# Patient Record
Sex: Female | Born: 1970 | Race: Black or African American | Hispanic: No | Smoking: Never smoker
Health system: Southern US, Community
[De-identification: ages and names within clinical notes are randomized; demographics above are authoritative.]

## PROBLEM LIST (undated history)

## (undated) DIAGNOSIS — G43909 Migraine, unspecified, not intractable, without status migrainosus: Secondary | ICD-10-CM

## (undated) DIAGNOSIS — N186 End stage renal disease: Secondary | ICD-10-CM

## (undated) DIAGNOSIS — R1084 Generalized abdominal pain: Secondary | ICD-10-CM

## (undated) DIAGNOSIS — K3184 Gastroparesis: Secondary | ICD-10-CM

## (undated) DIAGNOSIS — M1990 Unspecified osteoarthritis, unspecified site: Secondary | ICD-10-CM

## (undated) DIAGNOSIS — N189 Chronic kidney disease, unspecified: Secondary | ICD-10-CM

## (undated) DIAGNOSIS — F32A Depression, unspecified: Secondary | ICD-10-CM

## (undated) DIAGNOSIS — F39 Unspecified mood [affective] disorder: Secondary | ICD-10-CM

## (undated) DIAGNOSIS — E1065 Type 1 diabetes mellitus with hyperglycemia: Secondary | ICD-10-CM

## (undated) DIAGNOSIS — Z8619 Personal history of other infectious and parasitic diseases: Secondary | ICD-10-CM

## (undated) DIAGNOSIS — E1040 Type 1 diabetes mellitus with diabetic neuropathy, unspecified: Secondary | ICD-10-CM

## (undated) DIAGNOSIS — J45909 Unspecified asthma, uncomplicated: Secondary | ICD-10-CM

## (undated) DIAGNOSIS — F419 Anxiety disorder, unspecified: Secondary | ICD-10-CM

## (undated) DIAGNOSIS — L281 Prurigo nodularis: Secondary | ICD-10-CM

## (undated) DIAGNOSIS — D649 Anemia, unspecified: Secondary | ICD-10-CM

## (undated) DIAGNOSIS — I509 Heart failure, unspecified: Secondary | ICD-10-CM

## (undated) DIAGNOSIS — I219 Acute myocardial infarction, unspecified: Secondary | ICD-10-CM

## (undated) DIAGNOSIS — Z992 Dependence on renal dialysis: Secondary | ICD-10-CM

## (undated) DIAGNOSIS — IMO0002 Reserved for concepts with insufficient information to code with codable children: Secondary | ICD-10-CM

## (undated) DIAGNOSIS — Z95810 Presence of automatic (implantable) cardiac defibrillator: Secondary | ICD-10-CM

## (undated) DIAGNOSIS — K219 Gastro-esophageal reflux disease without esophagitis: Secondary | ICD-10-CM

## (undated) HISTORY — DX: Personal history of other infectious and parasitic diseases: Z86.19

## (undated) HISTORY — DX: Type 1 diabetes mellitus with hyperglycemia: E10.65

## (undated) HISTORY — DX: Migraine, unspecified, not intractable, without status migrainosus: G43.909

## (undated) HISTORY — DX: Unspecified asthma, uncomplicated: J45.909

## (undated) HISTORY — DX: Reserved for concepts with insufficient information to code with codable children: IMO0002

## (undated) HISTORY — DX: Depression, unspecified: F32.A

## (undated) HISTORY — DX: Prurigo nodularis: L28.1

## (undated) HISTORY — DX: Anemia, unspecified: D64.9

## (undated) HISTORY — DX: Unspecified osteoarthritis, unspecified site: M19.90

## (undated) HISTORY — DX: Unspecified mood (affective) disorder: F39

## (undated) HISTORY — DX: Type 1 diabetes mellitus with diabetic neuropathy, unspecified: E10.40

## (undated) HISTORY — DX: Heart failure, unspecified: I50.9

## (undated) HISTORY — DX: Anxiety disorder, unspecified: F41.9

---

## 1998-05-02 ENCOUNTER — Inpatient Hospital Stay (HOSPITAL_COMMUNITY): Admission: AD | Admit: 1998-05-02 | Discharge: 1998-05-02 | Payer: Self-pay | Admitting: *Deleted

## 1998-05-03 ENCOUNTER — Inpatient Hospital Stay (HOSPITAL_COMMUNITY): Admission: AD | Admit: 1998-05-03 | Discharge: 1998-05-03 | Payer: Self-pay | Admitting: Obstetrics

## 2005-11-14 ENCOUNTER — Ambulatory Visit: Payer: Self-pay | Admitting: Family Medicine

## 2005-11-28 ENCOUNTER — Ambulatory Visit: Payer: Self-pay | Admitting: Family Medicine

## 2006-04-10 ENCOUNTER — Ambulatory Visit: Payer: Self-pay | Admitting: Family Medicine

## 2006-05-15 ENCOUNTER — Ambulatory Visit: Payer: Self-pay | Admitting: Family Medicine

## 2006-07-03 ENCOUNTER — Ambulatory Visit: Payer: Self-pay | Admitting: Family Medicine

## 2006-09-18 ENCOUNTER — Ambulatory Visit: Payer: Self-pay | Admitting: Family Medicine

## 2007-06-18 DIAGNOSIS — E1040 Type 1 diabetes mellitus with diabetic neuropathy, unspecified: Secondary | ICD-10-CM

## 2007-06-18 DIAGNOSIS — E1069 Type 1 diabetes mellitus with other specified complication: Secondary | ICD-10-CM | POA: Insufficient documentation

## 2008-01-01 ENCOUNTER — Telehealth (INDEPENDENT_AMBULATORY_CARE_PROVIDER_SITE_OTHER): Payer: Self-pay | Admitting: Internal Medicine

## 2008-08-11 ENCOUNTER — Emergency Department (HOSPITAL_COMMUNITY): Admission: EM | Admit: 2008-08-11 | Discharge: 2008-08-11 | Payer: Self-pay | Admitting: Emergency Medicine

## 2008-09-21 ENCOUNTER — Ambulatory Visit: Payer: Self-pay | Admitting: "Endocrinology

## 2008-10-12 ENCOUNTER — Ambulatory Visit: Payer: Self-pay | Admitting: "Endocrinology

## 2009-07-05 ENCOUNTER — Encounter: Admission: RE | Admit: 2009-07-05 | Discharge: 2009-07-05 | Payer: Self-pay | Admitting: Internal Medicine

## 2010-08-26 ENCOUNTER — Encounter: Payer: Self-pay | Admitting: Internal Medicine

## 2010-11-19 LAB — DIFFERENTIAL
Basophils Absolute: 0.1 10*3/uL (ref 0.0–0.1)
Lymphocytes Relative: 34 % (ref 12–46)
Monocytes Absolute: 0.3 10*3/uL (ref 0.1–1.0)
Monocytes Relative: 6 % (ref 3–12)
Neutro Abs: 2.8 10*3/uL (ref 1.7–7.7)

## 2010-11-19 LAB — WET PREP, GENITAL

## 2010-11-19 LAB — URINALYSIS, ROUTINE W REFLEX MICROSCOPIC
Hgb urine dipstick: NEGATIVE
Leukocytes, UA: NEGATIVE
Specific Gravity, Urine: 1.025 (ref 1.005–1.030)
Urobilinogen, UA: 1 mg/dL (ref 0.0–1.0)

## 2010-11-19 LAB — URINE MICROSCOPIC-ADD ON

## 2010-11-19 LAB — CBC
Hemoglobin: 12.7 g/dL (ref 12.0–15.0)
RBC: 4.23 MIL/uL (ref 3.87–5.11)
RDW: 13.2 % (ref 11.5–15.5)

## 2010-11-19 LAB — GLUCOSE, CAPILLARY

## 2010-11-19 LAB — POCT PREGNANCY, URINE: Preg Test, Ur: POSITIVE

## 2012-12-02 ENCOUNTER — Ambulatory Visit (INDEPENDENT_AMBULATORY_CARE_PROVIDER_SITE_OTHER): Payer: BC Managed Care – PPO | Admitting: Internal Medicine

## 2012-12-02 ENCOUNTER — Other Ambulatory Visit: Payer: Self-pay | Admitting: Internal Medicine

## 2012-12-02 ENCOUNTER — Encounter: Payer: Self-pay | Admitting: Internal Medicine

## 2012-12-02 VITALS — BP 118/74 | HR 99 | Temp 97.7°F | Resp 12 | Ht 71.25 in | Wt 158.0 lb

## 2012-12-02 DIAGNOSIS — E559 Vitamin D deficiency, unspecified: Secondary | ICD-10-CM

## 2012-12-02 DIAGNOSIS — Z862 Personal history of diseases of the blood and blood-forming organs and certain disorders involving the immune mechanism: Secondary | ICD-10-CM

## 2012-12-02 DIAGNOSIS — E119 Type 2 diabetes mellitus without complications: Secondary | ICD-10-CM

## 2012-12-02 DIAGNOSIS — Z8639 Personal history of other endocrine, nutritional and metabolic disease: Secondary | ICD-10-CM

## 2012-12-02 LAB — COMPREHENSIVE METABOLIC PANEL
ALT: 13 U/L (ref 0–35)
Albumin: 4 g/dL (ref 3.5–5.2)
CO2: 27 mEq/L (ref 19–32)
Calcium: 9.4 mg/dL (ref 8.4–10.5)
Chloride: 103 mEq/L (ref 96–112)
GFR: 140.75 mL/min (ref >60.00)
Glucose, Bld: 317 mg/dL — ABNORMAL HIGH (ref 70–99)
Potassium: 4.5 mEq/L (ref 3.5–5.1)
Sodium: 137 mEq/L (ref 135–145)
Total Bilirubin: 1.4 mg/dL — ABNORMAL HIGH (ref 0.3–1.2)
Total Protein: 7.6 g/dL (ref 6.0–8.3)

## 2012-12-02 LAB — HEMOGLOBIN A1C: Hgb A1c MFr Bld: 11.4 % — ABNORMAL HIGH (ref 4.6–6.5)

## 2012-12-02 LAB — CBC
HCT: 41.1 % (ref 36.0–46.0)
Hemoglobin: 14.1 g/dL (ref 12.0–15.0)
Platelets: 216 10*3/uL (ref 150.0–400.0)
RBC: 4.64 Mil/uL (ref 3.87–5.11)
WBC: 5.9 10*3/uL (ref 4.5–10.5)

## 2012-12-02 LAB — MICROALBUMIN / CREATININE URINE RATIO: Microalb Creat Ratio: 3.5 mg/g (ref 0.0–30.0)

## 2012-12-02 LAB — LIPID PANEL: Total CHOL/HDL Ratio: 3

## 2012-12-02 MED ORDER — GLUCOSE BLOOD VI STRP: ORAL_STRIP | Status: DC

## 2012-12-02 MED ORDER — INSULIN GLARGINE 100 UNITS/ML SOLOSTAR PEN: 45.0000 [IU] | PEN_INJECTOR | Freq: Every morning | SUBCUTANEOUS | Status: DC

## 2012-12-02 MED ORDER — INSULIN ASPART 100 UNIT/ML FLEXPEN: SUBCUTANEOUS | Status: DC

## 2012-12-02 MED ORDER — ONETOUCH ULTRA SYSTEM W/DEVICE KIT: 1.0000 | PACK | Freq: Once | Status: DC

## 2012-12-02 MED ORDER — GLUCAGON (RDNA) 1 MG IJ KIT: 1.0000 mg | PACK | Freq: Once | INTRAMUSCULAR | Status: DC | PRN

## 2012-12-02 MED ORDER — ONETOUCH LANCETS MISC: Status: DC

## 2012-12-02 MED ORDER — "PEN NEEDLES 3/16"" 31G X 5 MM MISC": 1.0000 | Freq: Four times a day (QID) | Status: DC

## 2012-12-02 NOTE — Patient Instructions (Addendum)
Please return in 2 weeks with your sugar log. Decrease Lantus to 45 units.  Please do not inject short acting insulin at bedtime.   Basic Rules for Patients with Type I Diabetes Mellitus  1. The American Diabetes Association (ADA) recommended targets: - fasting sugar <130 - after meal sugar <180 - HbA1C <7%  2. Engage in ?150 min moderate exercise per week  3. Make sure you have ?8h of sleep every night as this helps both blood sugars and your weight.  4. Always keep a sugar log (not only record in your meter) and bring it to all appointments with [place].  5. "15-15 rule" for hypoglycemia: if sugars are low, take 15 g of carbs** ("fast sugar" - e.g. 4 glucose tablets, 4 oz orange juice), wait 15 min, then check sugars again. If still <80, repeat. Continue  until your sugars >80, then eat a normal meal.   6. Teach family members and coworkers to inject glucagon. Have a glucagon set at home and one at work. They should call 911 after using the set.  7. If you are on a pump, make sure the basal daily insulin dose is approximately equal (not larger) to the daily insulin you get from boluses, otherwise you are at risk for hypoglycemia.  8. Check sugar before driving. If <100, correct, and only start driving if sugars rise ?100. Check sugar every hour when on a long drive.  9. Check sugar before exercising. If <100, correct, and only start exercising if sugars rise ?100. Check sugar every hour when on a long exercise routine and 1h after you finished exercising.   If >250, check urine for ketones. If you have moderate-large ketones in urine, do not start exercise. Hydrate yourself with clear liquids and correct the high sugar. Recheck sugars and ketones before attempting to exercise.  Be aware that you might need less insulin when exercising.  *intense, short, exercise bursts can increase your sugars, but  *less intense, longer (>1h), exercise routines can decrease your sugars.  If you are on  a pump, you might need to decrease your basal rate by 10% or more (or even disconnect your pump) while you exercise to prevent low sugars. Do not disconnect your pump by more than 3 hours at a time! You also might need to decrease your insulin bolus for the meal prior to your exercise time by 20% or more.  10. Make sure you have a MedAlert bracelet or pendant mentioning "Type I Diabetes Mellitus". If you have a prior episode of severe hypoglycemia or hypoglycemia unawareness, it should also mention this.  11. Please do not walk barefoot. Inspect your feet for sores/cuts and let us know if you have them.  12. Please call [facility] with any questions and concerns [phone]).   **E.g. of "fast carbs":   first choice (15 g):  1 tube glucose gel, GlucoPouch 15, 2 oz glucose liquid   second choice (15-16 g):  3 or 4 glucose tablets (best taken  with water), 15 Dextrose Bits chewable   third choice (15-20 g):   cup fruit juice,  cup regular soda, 1 cup skim milk,  1 cup sports drink   fourth choice (15-20 g):  1 small tube [place] gel (not frosting), 2 tbsp raisins, 1 tbsp table sugar,  candy, jelly beans, gum drops - check package for carb amount   (adapted from: Lenice Pressman. "Insulin therapy and hypoglycemia" Endocrinol Metab Clin N Am 2012, 41: 57-87)

## 2012-12-02 NOTE — Progress Notes (Signed)
Patient ID: [name], female   DOB: 08/07/1970, 42 y.o.   MRN: [id number] HPI: [name] is a 42 y.o.-year-old female, self-referred, for management of DM (questionable if type 1 or 2), insulin-dependent, uncontrolled, with complications.  Patient has been diagnosed with diabetes in 1996, with her first pregnancy; she had 2 miscarriages; she did not have repeat testing until her next pregnancy in 2006; she started to have blurry vision, tingling and numbness in feet. She was told she needs insulin then and started it. She did not have [employer] for 3 years. She used to go see [facility] up to 2011, after this, [facility] She now has insurance, and she started to schedule physician's appointments. She had an appointment with a podiatrist recently, she has this appointment with me, will have an appointment with an ophthalmologist (Dr. Zadie Rhine) next month, and she will have an appt with a PCP 01/2013.   She did not have any episodes of DKA, including the times when she ran out of insulin. Last hemoglobin A1c was: >10%, but she cannot remember the value  Pt is on a regimen of: - Metformin 500 bid - Lantus 50 units in am - Novolog 5-10 units tid ac, sometimes at bedtime, too She tells me that she ran out of her metformin at the end of last month, and she also ran out of Lantus a few days ago. She needs refills.  Pt does not have test strips to check her sugars. She can tell if she has highs or lows based on the way she feels; she mentions that she feels she has a low (she becomes anxious) on a regular basis during the day, and sometimes during the night too; she has hypoglycemia awareness but unclear at what value. 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 To avoid hypoglycemia with this increased workout, she frequently skips her a.m. dose of NovoLog insulin. Weight loss: 25 lbs in last 5 years. She has increased thirst and  urination, having nocturia many times a night.   Pt's meals are: - Breakfast: Bananas/peanut butter crackers/coffee - Lunch: 6 inch sub, chips, diet drink, salad, tacos - Dinner: Baked chicken, rice, green beans, Jell-O - Snacks: 2 a day  it is unclear whether patient has chronic kidney disease. Pt's last eye exam was in 2009. Reportedly no DR. Will go again next mo to Dr. Zadie Rhine. Has numbness and tingling in her legs and hands - on Gabapentin 300 bid. She will start Lyrica and Metanx. Sees podiatry.   I reviewed her chart and she also has a history of multinodular goiter, she had a thyroid ultrasound on 07/05/09, showing then enlarged thyroid with a single nonspecific nodule of 1 x 0.5 x 0.8 cm in isthmus. It was commented that the nodule did not meet criteria for biopsy.   Pt has FH of DM in mother, both GMs, and PGF. she has a family history of thyroid disease in her mom.   ROS: Constitutional: + weight loss, no fatigue, hot flashes, poor sleep, excessive urination, waking up at night to urinate more than 10 times Eyes: + blurry vision, no xerophthalmia ENT: no sore throat, no nodules palpated in throat, no dysphagia/odynophagia, no hoarseness Cardiovascular: no CP/SOB/palpitations/leg swelling Respiratory: + cough/ no SOB Gastrointestinal: no N/V/D/C, + heartburn Musculoskeletal: no muscle/+ joint aches Skin: no rashes, + itching , + hair loss  Neurological: no tremors/numbness/tingling/dizziness, + headache  Psychiatric: + depression/ + anxiety  low  libido, dysmenorrhea  Past Medical History  Diagnosis Date  . Diabetes mellitus without complication     Type II  . Anxiety   . Arthritis    History reviewed. No pertinent past surgical history.  History   Social History  . Marital Status:  married     Spouse Name: N/A    Number of Children: 2   Occupational History  .  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    Social History Main Topics  .  Smoking status: Never Smoker   . Smokeless tobacco: Not on file  . Alcohol Use: Yes, wine once or twice a week    . Drug Use: No  . Sexually Active: Yes -- Female partner(s)   Social History Narrative   Regular exercise: yes, active   Caffeine use: daily   Family History  Problem Relation Age of Onset  . Diabetes Mother   . Hypertension Mother   . Thyroid disease Mother   . Cancer Maternal Grandmother     Breast   . Cancer Maternal Grandfather   . Cancer Paternal Grandmother     stomach  . Diabetes Paternal Grandmother   . Cancer Paternal Grandfather   . Diabetes Paternal Grandfather    PE: BP 118/74  Pulse 99  Temp(Src) 97.7 F (36.5 C) (Oral)  Resp 12  Ht 5' 11.25" (1.81 m)  Wt 158 lb (71.668 kg)  BMI 21.88 kg/m2  SpO2 99%  LMP 11/07/2012 Wt Readings from Last 3 Encounters:  12/02/12 158 lb (71.668 kg)   Constitutional: overweight, in NAD Eyes: PERRLA, EOMI, no exophthalmos ENT: moist mucous membranes, no thyromegaly, no cervical lymphadenopathy Cardiovascular: RRR, No MRG Respiratory: CTA B Gastrointestinal: abdomen soft, NT, ND, BS+ Musculoskeletal: no deformities, strength intact in all 4 Skin: moist, warm, no rashes Neurological: no tremor with outstretched hands, DTR normal in all 4  ASSESSMENT: 1. DM, non-insulin-dependent, uncontrolled, without complications  2. Thyroid nodule  3. Vit D def.  PLAN:  1. It is unclear whether patient has DM 1 or DM 2, based on the fact that she is lean and unlikely to have insulin resistance, my bias would be towards type 1 diabetes, however the fact that she did not have episodes of DKA even when she went off medications and her diabetes history makes me think that she might have type 2 diabetes. I would like to do is to C-peptide and anti-islet antibodies, however would not check these at this time, since her sugars are most likely elevated and this will render her C-peptide level uninterpretable. For now I would treat  her as a type I, I will continue her metformin at least until the next visit. - Since I do not have sugars to go by, I sent a prescription for a One Touch meter, strips - at her pharmacy - to avoid further hypoglycemia, I advised her to decrease the dose of her Lantus from 50 to 45 units, however, since she has been off the medication for the last few days, I advised her to start with half of this dose and check her sugars frequently - Regarding her NovoLog, will continue the same mealtime insulin for now, but I advised her not to take her bedtime dose - I gave her 2 Lantus pen samples, and one Humalog sample pen, as this is interchangeable with NovoLog. I also sent refills of her insulin so her pharmacy - I sent a prescription for Glucagon Emergency Kit to her pharmacy. She  had one in the past, not anymore. - given sugar log and advised how to fill it and to bring it at next appt - She should check 4 times a day, before meals and at bedtime  - given foot care handout and explained the principles - given instructions for hypoglycemia management "15-15 rule" - given general instructions about exercising and driving with DM, please see Pt instructions section.  - Will check a hemoglobin A1c, microalbumin/creatinine ratio, CMP, fasting lipid profile and a TSH today - I advised the patient to join my chart - I will see her back in 2 weeks with her sugar log  2. Thyroid nodule - we discussed about getting another ultrasound to see if the nodule grew, I will order this today.  - She does not appear to have neck compression symptoms  Office Visit on 12/02/2012  Component Date Value Range Status  . TSH 12/02/2012 0.32* 0.35 - 5.50 uIU/mL Final  . Sodium 12/02/2012 137  135 - 145 mEq/L Final  . Potassium 12/02/2012 4.5  3.5 - 5.1 mEq/L Final  . Chloride 12/02/2012 103  96 - 112 mEq/L Final  . CO2 12/02/2012 27  19 - 32 mEq/L Final  . Glucose, Bld 12/02/2012 317* 70 - 99 mg/dL Final  . BUN 12/02/2012  7  6 - 23 mg/dL Final  . Creatinine, Ser 12/02/2012 0.6  0.4 - 1.2 mg/dL Final  . Total Bilirubin 12/02/2012 1.4* 0.3 - 1.2 mg/dL Final  . Alkaline Phosphatase 12/02/2012 65  39 - 117 U/L Final  . AST 12/02/2012 11  0 - 37 U/L Final  . ALT 12/02/2012 13  0 - 35 U/L Final  . Total Protein 12/02/2012 7.6  6.0 - 8.3 g/dL Final  . Albumin 12/02/2012 4.0  3.5 - 5.2 g/dL Final  . Calcium 12/02/2012 9.4  8.4 - 10.5 mg/dL Final  . GFR 12/02/2012 140.75  >60.00 mL/min Final  . Hemoglobin A1C 12/02/2012 11.4* 4.6 - 6.5 % Final   Glycemic Control Guidelines for People with Diabetes:Non Diabetic:  <6%Goal of Therapy: <7%Additional Action Suggested:  >8%   . Cholesterol 12/02/2012 173  0 - 200 mg/dL Final   ATP III Classification       Desirable:  < 200 mg/dL               Borderline High:  200 - 239 mg/dL          High:  > = 240 mg/dL  . Triglycerides 12/02/2012 53.0  0.0 - 149.0 mg/dL Final   Normal:  <150 mg/dLBorderline High:  150 - 199 mg/dL  . HDL 12/02/2012 63.90  >39.00 mg/dL Final  . VLDL 12/02/2012 10.6  0.0 - 40.0 mg/dL Final  . LDL Cholesterol 12/02/2012 99  0 - 99 mg/dL Final  . Total CHOL/HDL Ratio 12/02/2012 3   Final                  Men          Women1/2 Average Risk     3.4          3.3Average Risk          5.0          4.42X Average Risk          9.6          7.13X Average Risk          15.0          11.0                      .  WBC 12/02/2012 5.9  4.5 - 10.5 K/uL Final  . RBC 12/02/2012 4.64  3.87 - 5.11 Mil/uL Final  . Platelets 12/02/2012 216.0  150.0 - 400.0 K/uL Final  . Hemoglobin 12/02/2012 14.1  12.0 - 15.0 g/dL Final  . HCT 12/02/2012 41.1  36.0 - 46.0 % Final  . MCV 12/02/2012 88.6  78.0 - 100.0 fl Final  . MCHC 12/02/2012 34.4  30.0 - 36.0 g/dL Final  . RDW 12/02/2012 12.8  11.5 - 14.6 % Final  . Microalb, Ur 12/02/2012 1.8  0.0 - 1.9 mg/dL Final  . Creatinine,U 12/02/2012 51.1   Final  . Microalb Creat Ratio 12/02/2012 3.5  0.0 - 30.0 mg/g Final   Since her TSH is  lower than normal, I will hold off on the thyroid ultrasound for now, and we'll recheck a TSH free T4 and free T3 at next visit in 2 weeks, after which we'll decide whether we need to do a thyroid uptake and scan or we can proceed with the ultrasound.   Glucose high, no acidosis. No microalbuminuria. Hemoglobin A1c high, as expected. Vit D low >> start Ergo weekly x 8 weeks, then 2000 Cholecalciferol daily. Letter sent.

## 2012-12-03 ENCOUNTER — Encounter: Payer: Self-pay | Admitting: Internal Medicine

## 2012-12-03 ENCOUNTER — Telehealth: Payer: Self-pay | Admitting: Internal Medicine

## 2012-12-03 ENCOUNTER — Telehealth: Payer: Self-pay | Admitting: *Deleted

## 2012-12-03 DIAGNOSIS — E559 Vitamin D deficiency, unspecified: Secondary | ICD-10-CM | POA: Insufficient documentation

## 2012-12-03 DIAGNOSIS — Z8639 Personal history of other endocrine, nutritional and metabolic disease: Secondary | ICD-10-CM | POA: Insufficient documentation

## 2012-12-03 LAB — VITAMIN D 25 HYDROXY (VIT D DEFICIENCY, FRACTURES): Vit D, 25-Hydroxy: 13 ng/mL — ABNORMAL LOW (ref 30–89)

## 2012-12-03 MED ORDER — ERGOCALCIFEROL 1.25 MG (50000 UT) PO CAPS: 50000.0000 [IU] | ORAL_CAPSULE | ORAL | Status: DC

## 2012-12-03 MED ORDER — INSULIN ASPART 100 UNIT/ML FLEXPEN: PEN_INJECTOR | SUBCUTANEOUS | Status: DC

## 2012-12-03 MED ORDER — METFORMIN HCL 500 MG PO TABS: 500.0000 mg | ORAL_TABLET | Freq: Two times a day (BID) | ORAL | Status: DC

## 2012-12-03 NOTE — Telephone Encounter (Signed)
Patient is requesting new Rx for Metformin & would like to switch from Novalog vials to pens.

## 2012-12-03 NOTE — Telephone Encounter (Signed)
[name], can you please enter them? I will sign.

## 2012-12-03 NOTE — Telephone Encounter (Signed)
Please read note below and advise. Thank you.  

## 2012-12-03 NOTE — Telephone Encounter (Signed)
Message copied by Lucius Conn on Thu Dec 03, 2012 10:15 AM ------      Message from: Philemon Kingdom      Created: Wed Dec 02, 2012  7:56 PM       [name],      Can you please tell the scheduler/nurse coordinator at [facility] to hold off on scheduling the thyroid U/S for [name]? Her TSH is low, so will need to repeat the TSH first (will do this at next visit, in 2 weeks).       Thank you,      CG ------

## 2012-12-03 NOTE — Telephone Encounter (Signed)
[place], [facility] at [facility] and asked her to cancel the appt at [facility] for the U/S for pt. That her TSH is low. Dr Cruzita Lederer wants to wait until she does a repeat TSH in 2 weeks to do U/S. I contacted pt and let her know that she does not have to go to have the U/S done today. We will see her at her follow up appt in 2 weeks, she will have blood work done to check her TSH level then Dr Cruzita Lederer will decide about the U/S. Pt understood.

## 2012-12-03 NOTE — Telephone Encounter (Signed)
They rx's went via e-script.

## 2012-12-08 LAB — HM DIABETES EYE EXAM

## 2012-12-18 ENCOUNTER — Ambulatory Visit (INDEPENDENT_AMBULATORY_CARE_PROVIDER_SITE_OTHER): Payer: BC Managed Care – PPO | Admitting: Internal Medicine

## 2012-12-18 ENCOUNTER — Encounter: Payer: Self-pay | Admitting: Internal Medicine

## 2012-12-18 VITALS — BP 112/68 | HR 93 | Temp 97.8°F | Resp 12 | Ht 71.25 in | Wt 169.0 lb

## 2012-12-18 DIAGNOSIS — E119 Type 2 diabetes mellitus without complications: Secondary | ICD-10-CM

## 2012-12-18 DIAGNOSIS — E559 Vitamin D deficiency, unspecified: Secondary | ICD-10-CM

## 2012-12-18 DIAGNOSIS — Z8639 Personal history of other endocrine, nutritional and metabolic disease: Secondary | ICD-10-CM

## 2012-12-18 LAB — T4, FREE: Free T4: 0.93 ng/dL (ref 0.60–1.60)

## 2012-12-18 LAB — T3, FREE: T3, Free: 3 pg/mL (ref 2.3–4.2)

## 2012-12-18 MED ORDER — INSULIN GLARGINE 100 UNITS/ML SOLOSTAR PEN: 35.0000 [IU] | PEN_INJECTOR | Freq: Every morning | SUBCUTANEOUS | Status: DC

## 2012-12-18 MED ORDER — INSULIN ASPART 100 UNIT/ML FLEXPEN: PEN_INJECTOR | SUBCUTANEOUS | Status: DC

## 2012-12-18 NOTE — Patient Instructions (Addendum)
Please stop the Metformin. Decrease the Lantus to 35 units at night. Use 5 units on Novolog 15 min before the main meals (3x a day). Add the following sliding scale of Novolog for breakfast - only in the days that you go to work and stay active in am: - 150-200: + 1 units - 201-250: + 2 units - 251-300: + 3 units - >300: + 4 units Add the following sliding scale of Novolog for lunch and dinner: - 150-175: + 1 unit - 176-200: + 2 units - 201-225: + 3 units - 226-250: + 4 units - >251: + 5 units Please return in 1 month. Bring your sugar log. Check sugars at least 3x a day.

## 2012-12-18 NOTE — Progress Notes (Signed)
Patient ID: [name], female   DOB: [birth year], 42 y.o.   MRN: [id number]  HPI: [name] is a 42 y.o.-year-old woman, returning for f/u for DM (questionable if type 1 or 2), dx 1996, insulin-dependent, uncontrolled, with complications ( peripheral neuropathy). Her first appointment with me was 2 weeks ago.  It is unclear whether the patient has type I or type 2 diabetes. She did not have any episodes of DKA, including the times when she ran out of insulin in the past. Last hemoglobin A1c was:   Lab Results  Component Value Date   HGBA1C 11.4* 12/02/2012   Pt is on a regimen of: - Metformin 500 bid >> restarted at last visit, after running out of it for few days prior to last appt., but unclear if patient is supposed to take this since she might have type 1 diabetes - Lantus 45 units in hs, changed from 50 units in am at last visit, due to possible lows (was not checking sugars then) >> restarted at last visit, after running out of it for few days prior to last appt - Humalog 5-10 units tid ac (stopped the SSI used at bedtime at last visit, also due to lows)  SSI (made by her):  114-130: 2 (correction) + 3-7 (~ carbs) 140-200: 8 + 4 Eats a lot of salad, fruit, not much bread.  At last visit, pt did not have test strips to check her sugars. I gave her sugar logs and test strips prescription. She felt she was getting low CBGs, so that she was frequently skipping her a.m. dose of NovoLog insulin.   Patient started to check her sugars, and brings her meter, but she forgot her sugar log. We extracted the data from her meter: - a.m.: 58-235, however in the last week 114-153 - 2 hours after breakfast: 66-247, however in the last week: 66 and 70 - before lunch: 81 and 246 - not checked in last week -  2 hours after lunch: 135 and 171, not checked in last week - before dinner: 130 and 189, not checked in last week - 2 hours after dinner 131-289, and last week she has one  measurement of 131 - bedtime: 3 measurements, none in the last week: 45, 318, 193 Lower sugar was 45, which was right after our last visit. She can definitely feel these lows.  No chronic kidney disease: Lab Results  Component Value Date   CREATININE 0.6 12/02/2012   Lab Results  Component Value Date   BUN 7 12/02/2012   Pt's last eye exam was this month. Reportedly no DR. She goes back in 3 month - Dr. Zadie Rhine. Has numbness and tingling in her legs and hands - on Gabapentin 300 bid. She started Metanx and feels that this is helping. Sees podiatry.   Pt has a history of multi/uni?-nodular goiter, she had a thyroid ultrasound on 07/05/09, showing then enlarged thyroid with a single nonspecific nodule of 1 x 0.5 x 0.8 cm in isthmus. It was commented that the nodule did not meet criteria for biopsy. At last visit, I checked a TSH, and this was suppressed: Lab Results  Component Value Date   TSH 0.32* 12/02/2012  so I planned to recheck her thyroid tests at this visit, and, if TSH still suppressed, to get a thyroid uptake and scan before proceeding with the thyroid ultrasound to followup on her nodule.  At last visit, she was also found to have vitamin D deficiency. Her vitamin  D level was 13. I started her on high-dose ergocalciferol weekly. She will take her third dose today.  I reviewed pt's medications, allergies, PMH, social hx, family hx and no changes required, except as above.  ROS: Constitutional: + weight loss, no fatigue, hot flashes, poor sleep, excessive urination, waking up at night to urinate more than 10 times Eyes: + blurry vision, no xerophthalmia ENT: no sore throat, no nodules palpated in throat, no dysphagia/odynophagia, no hoarseness Cardiovascular: no CP/SOB/palpitations/leg swelling Respiratory: + cough/ no SOB Gastrointestinal: no N/V/D/C, + heartburn Musculoskeletal: no muscle/+ joint aches Skin: no rashes, + itching , + hair loss  Neurological: no  tremors/numbness/tingling/dizziness, + headache  Psychiatric: + depression/ + anxiety  low libido, dysmenorrhea  PE: Pulse 93  Temp(Src) 97.8 F (36.6 C) (Oral)  Resp 12  Wt 169 lb (76.658 kg)  BMI 23.4 kg/m2  SpO2 98%  LMP 11/07/2012 Wt Readings from Last 3 Encounters:  12/18/12 169 lb (76.658 kg)  12/02/12 158 lb (71.668 kg)   Constitutional: normal weight, in NAD Eyes: PERRLA, EOMI, no exophthalmos ENT: moist mucous membranes, no thyromegaly, no cervical lymphadenopathy Cardiovascular: RRR, No MRG Respiratory: CTA B Gastrointestinal: abdomen soft, NT, ND, BS+ Musculoskeletal: no deformities, strength intact in all 4 Skin: moist, warm, no rashes Neurological: no tremor with outstretched hands, DTR normal in all 4  ASSESSMENT: 1. DM, non-insulin-dependent, uncontrolled, with complications - peripheral neuropathy  2. Thyroid nodule  3. Vit D def.  PLAN:  1. It is unclear whether patient has DM 1 or DM 2, based on the fact that she is lean and unlikely to have insulin resistance, my bias would be towards type 1 diabetes, however the fact that she did not have episodes of DKA even when she went off medications and her diabetes history makes me think that she might have type 2 diabetes. Since her sugars are better now, we can check a C-peptide, anti-GAD antibodies and anti-islet cell antibodies at this visit. The C-peptide is only interpretable in her sugars are lower than 200 at the time of the check. - since her sugars very widely in a.m., this is an indication that she gets too much basal insulin at night, therefore I advised her to decrease her Lantus dose from 45 to 35 units. - she has a very interesting slight scale that she uses for her meals, but this does not appear to work very well, and I gave her the following new NovoLog mealtime + sliding scale regimen: Use 5 units on Novolog 15 min before the main meals (3x a day). Add the following sliding scale of Novolog for  breakfast - only in the days that you go to work and stay active in am: - 150-200: + 1 units - 201-250: + 2 units - 251-300: + 3 units - >300: + 4 units Add the following sliding scale of Novolog for lunch and dinner: - 150-175: + 1 unit - 176-200: + 2 units - 201-225: + 3 units - 226-250: + 4 units - >251: + 5 units - I would also like to stop the metformin at this visit - given your sugar logs - advised her to check sugars at least 3 times a day - return to clinic in one month with her sugar log  2. Thyroid nodule - she has a 1 cm thyroid nodule parathyroid ultrasound obtaining 2010, and at last visit we discussed about getting another ultrasound to see if it grew. However, her TSH was slightly low, and  I would like to recheck her TSH along with a free T4 and previously today to make sure that her tests are not abnormal. If the TSH is still low, will need to obtain a thyroid uptake and scan before evaluating the need of a thyroid ultrasound. - I explained this to the patient and she understands and agrees with the plan  3. Vitamin D deficiency - Patient's vitamin D was 13 at last visit, so I started her on high-dose ergocalciferol 50,000 units weekly. She had 2 doses already. I advised her to continue with 2000 units over-the-counter vitamin D after she runs out of her high dose ergocalciferol prescription. We will need to recheck her vitamin D in  2-6 months.  Office Visit on 12/18/2012  Component Date Value Range Status  . TSH 12/18/2012 0.45  0.35 - 5.50 uIU/mL Final  . Free T4 12/18/2012 0.93  0.60 - 1.60 ng/dL Final  . T3, Free 12/18/2012 3.0  2.3 - 4.2 pg/mL Final  . C-Peptide 12/18/2012 0.15* 0.80 - 3.90 ng/mL Final  . Glucose, Bld 12/18/2012 75  70 - 99 mg/dL Final  . Pancreatic Islet Cell Antibody 12/18/2012 <5  < 5 JDF Units Final   Comment:                             Laboratory Developed Test performed using a reagent labeled by                           the manufacturer  as ASR Class I or ASR Class II (non-blood bank).                                                      This test(s) was developed and its performance characteristics                           have been determined by [employer],                           [place], [place]. It has not been cleared or approved by the [place]                           Food and Drug Administration. The FDA has determined that such                           clearance or approval is not necessary. Performance                           characteristics refer to the analytical performance of the test.  . Glutamic Acid Decarb Ab 12/18/2012 2.7* <=1.0 U/mL Final   Anti-GAD antibodies high, C-pp low (although Glu low, too) >> likely DM1.

## 2012-12-22 LAB — ANTI-ISLET CELL ANTIBODY: Pancreatic Islet Cell Antibody: <5 JDF Units (ref <5)

## 2012-12-23 LAB — GLUTAMIC ACID DECARBOXYLASE AUTO ABS: Glutamic Acid Decarb Ab: 2.7 U/mL — ABNORMAL HIGH (ref <=1.0)

## 2012-12-25 ENCOUNTER — Encounter: Payer: Self-pay | Admitting: Internal Medicine

## 2012-12-30 ENCOUNTER — Ambulatory Visit (INDEPENDENT_AMBULATORY_CARE_PROVIDER_SITE_OTHER): Payer: BC Managed Care – PPO | Admitting: Family Medicine

## 2012-12-30 ENCOUNTER — Encounter: Payer: Self-pay | Admitting: Family Medicine

## 2012-12-30 ENCOUNTER — Telehealth: Payer: Self-pay | Admitting: Family Medicine

## 2012-12-30 VITALS — BP 126/82 | HR 88 | Temp 98.2°F | Ht 71.5 in | Wt 164.5 lb

## 2012-12-30 DIAGNOSIS — F39 Unspecified mood [affective] disorder: Secondary | ICD-10-CM | POA: Insufficient documentation

## 2012-12-30 DIAGNOSIS — E559 Vitamin D deficiency, unspecified: Secondary | ICD-10-CM

## 2012-12-30 DIAGNOSIS — Z862 Personal history of diseases of the blood and blood-forming organs and certain disorders involving the immune mechanism: Secondary | ICD-10-CM

## 2012-12-30 DIAGNOSIS — E1049 Type 1 diabetes mellitus with other diabetic neurological complication: Secondary | ICD-10-CM

## 2012-12-30 DIAGNOSIS — G589 Mononeuropathy, unspecified: Secondary | ICD-10-CM

## 2012-12-30 DIAGNOSIS — R079 Chest pain, unspecified: Secondary | ICD-10-CM | POA: Insufficient documentation

## 2012-12-30 DIAGNOSIS — E1040 Type 1 diabetes mellitus with diabetic neuropathy, unspecified: Secondary | ICD-10-CM

## 2012-12-30 DIAGNOSIS — Z8639 Personal history of other endocrine, nutritional and metabolic disease: Secondary | ICD-10-CM

## 2012-12-30 DIAGNOSIS — Z309 Encounter for contraceptive management, unspecified: Secondary | ICD-10-CM

## 2012-12-30 MED ORDER — LORAZEPAM 0.5 MG PO TABS: 0.2500 mg | ORAL_TABLET | Freq: Two times a day (BID) | ORAL | Status: DC | PRN

## 2012-12-30 NOTE — Telephone Encounter (Signed)
Saw you today and patient was wondering if you were supposed to send in a RX for Lyrica? Please advise and call the patient back on [phone]

## 2012-12-30 NOTE — Assessment & Plan Note (Addendum)
GAD7 = 18/21 PHQ9 = 14/27, somewhat difficult to function Positive MDQ with 8 questions answered affirmative, and positive fmhx bipolar (mother). Therefore I have recommended referral to psychiatrist to eval for bipolar prior to commencing mood medication. I would also like to get recommendations from psychiatrist on bipolar meds (if needed) in h/o type I diabetes. In interim, given description of anxiety attacks, I have prescribed ativan 0.5mg  to take 1/2 to 1 tablet prn anxiety attack.  Discussed dependence and tolerance possibilities of meds - recommended temporary course of this.
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
## 2012-12-30 NOTE — Patient Instructions (Addendum)
Pass by Marion's office to schedule appointment with [facility] cardiologist. May use ativan 1/2 to 1 tablet up to twice daily as needed for anxiety We have referred you to psychiatrist for evaluation of bipolar disorder. Return in 1-2 months for wellness exam, prior fasting for blood work  Return prior if needed. We will further discuss birth control at next visit - after heart is evaluated. Good to meet you today, call us with questions.

## 2012-12-30 NOTE — Assessment & Plan Note (Addendum)
Overall atypical, however given she does have significant risk factors for CAD in setting of recently poorly controlled diabetes with hyperglycemia to 500s and strong family history, I do recommend referral to cardiologist for further risk stratification.  I also want cardiac evaluation prior to discussion of hormonal contraception. I also suggested she start enteric coated aspirin in interim. Pt agrees with plan. Check FLP next fasting blood work. She endorses history of abnormal EKG and stress test.  EKG today - NSR rate 80s, normal axis, intervals, no acute ST/T changes

## 2012-12-31 ENCOUNTER — Telehealth: Payer: Self-pay | Admitting: *Deleted

## 2012-12-31 ENCOUNTER — Encounter: Payer: Self-pay | Admitting: Family Medicine

## 2012-12-31 DIAGNOSIS — Z309 Encounter for contraceptive management, unspecified: Secondary | ICD-10-CM | POA: Insufficient documentation

## 2012-12-31 MED ORDER — PREGABALIN 75 MG PO CAPS: 75.0000 mg | ORAL_CAPSULE | Freq: Two times a day (BID) | ORAL | Status: DC

## 2012-12-31 NOTE — Telephone Encounter (Signed)
[name], I sent her  A letter on 05/23 - can you please read it to her and maybe send it again? Thank you.

## 2012-12-31 NOTE — Assessment & Plan Note (Signed)
Discussed different options, however discussed I want cards eval prior to prescribing hormonal contraception. Pt not currently interested in copper IUD.

## 2012-12-31 NOTE — Telephone Encounter (Signed)
plz pone in and notify sent in. To take 1 pill nightly for 1 week then may increase to one pill twice daily.

## 2012-12-31 NOTE — Telephone Encounter (Signed)
Pt called and lvm requesting lab results. Please advise.

## 2012-12-31 NOTE — Assessment & Plan Note (Signed)
Stable TFTs last check - has f/u with endo next month.

## 2012-12-31 NOTE — Assessment & Plan Note (Addendum)
Overall improving control - will continue to follow with Dr. Cruzita Lederer. I appreciate excellent care provided by endo. On metanx, noticing improvement however persistent paresthesias that bother her worse at night.  Discussed lyrica.  Pt requests trial of this.

## 2012-12-31 NOTE — Telephone Encounter (Signed)
Message left notifying patient and Rx called in as directed.

## 2012-12-31 NOTE — Telephone Encounter (Signed)
Called pt and spoke with her. She told me that she had went by the [facility] office and that Dr Danise Mina explained the results to her. She understood and she will see [place] on her follow up appt in June.

## 2012-12-31 NOTE — Assessment & Plan Note (Signed)
Currently on weekly supplementation, then will start 2000 IU daily.

## 2013-01-03 HISTORY — PX: OTHER SURGICAL HISTORY: SHX169

## 2013-01-04 ENCOUNTER — Ambulatory Visit: Payer: BC Managed Care – PPO | Admitting: Family Medicine

## 2013-01-05 ENCOUNTER — Encounter: Payer: Self-pay | Admitting: Family Medicine

## 2013-01-05 ENCOUNTER — Encounter: Payer: Self-pay | Admitting: Radiology

## 2013-01-05 ENCOUNTER — Ambulatory Visit (INDEPENDENT_AMBULATORY_CARE_PROVIDER_SITE_OTHER): Payer: BC Managed Care – PPO | Admitting: Family Medicine

## 2013-01-05 VITALS — BP 114/78 | HR 92 | Temp 98.2°F | Wt 168.5 lb

## 2013-01-05 DIAGNOSIS — L989 Disorder of the skin and subcutaneous tissue, unspecified: Secondary | ICD-10-CM

## 2013-01-05 DIAGNOSIS — Z20818 Contact with and (suspected) exposure to other bacterial communicable diseases: Secondary | ICD-10-CM

## 2013-01-05 DIAGNOSIS — Z2089 Contact with and (suspected) exposure to other communicable diseases: Secondary | ICD-10-CM

## 2013-01-05 DIAGNOSIS — R21 Rash and other nonspecific skin eruption: Secondary | ICD-10-CM | POA: Insufficient documentation

## 2013-01-05 MED ORDER — FLUCONAZOLE 150 MG PO TABS: 150.0000 mg | ORAL_TABLET | Freq: Once | ORAL | Status: DC

## 2013-01-05 MED ORDER — DOXYCYCLINE HYCLATE 100 MG PO CAPS: 100.0000 mg | ORAL_CAPSULE | Freq: Two times a day (BID) | ORAL | Status: DC

## 2013-01-05 NOTE — Assessment & Plan Note (Signed)
Bilateral lower leg skin lesions - given recent exposure and increased risk of infection with T1DM and open wounds present, will treat with 10 d doxy systemically to treat any possible MRSA colonization. Discussed importance of universal precautions as well as washing all clothings exposed, and discussed not sharing towels, discussed use of gloves when in contact with client especially around body fluids. Requests diflucan if needed.

## 2013-01-05 NOTE — Patient Instructions (Signed)
Universal precautions. Wash all clothings in hot water.  Community-Associated MRSA CA-MRSA stands for community-associated methicillin-resistant Staphylococcus aureus. MRSA is a type of bacteria that is resistant to some common antibiotics. It can cause infections in the skin and many other places in the body. Staphylococcus aureus, often called "staph," is a bacteria that normally lives on the skin or in the nose. Staph on the surface of the skin or in the nose does not cause problems. However, if the staph enters the body through a cut, wound, or break in the skin, an infection can happen. Up until recently, infections with the MRSA type of staph mainly occurred in hospitals and other healthcare settings. There are now increasing problems with MRSA infections in the community as well. Infections with MRSA may be very serious or even life-threatening. CA-MRSA is becoming more common. It is known to spread in crowded settings, in jails and prisons, and in situations where there is close skin-to-skin contact, such as during sporting events or in locker rooms. MRSA can be spread through shared items, such as children's toys, razors, towels, or sports equipment.  CAUSES All staph, including MRSA, are normally harmless unless they enter the body through a scratch, cut, or wound, such as with surgery. All staph, including MRSA, can be spread from person-to-person by touching contaminated objects or through direct contact.  MRSA now causes illness in people who have not been in hospitals or other healthcare facilities. Cases of MRSA diseases in the community have been associated with:  Recent antibiotic use.  Sharing contaminated towels or clothes.  Having active skin diseases.  Participating in contact sports.  Living in crowded settings.  Intravenous (IV) drug use.  Community-associated MRSA infections are usually skin infections, but may cause other severe illnesses.  Staph bacteria are one of  the most common causes of skin infection. However, they are also a common cause of pneumonia, bone or joint infections, and bloodstream infections. DIAGNOSIS Diagnosis of MRSA is done by cultures of fluid samples that may come from:  Swabs taken from cuts or wounds in infected areas.  Nasal swabs.  Saliva or deep cough specimens from the lungs (sputum).  Urine.  Blood. Many people are "colonized" with MRSA but have no signs of infection. This means that people carry the MRSA germ on their skin or in their nose and may never develop MRSA infection.  TREATMENT  Treatment varies and is based on how serious, how deep, or how extensive the infection is. For example:  Some skin infections, such as a small boil or abscess, may be treated by draining yellowish-white fluid (pus) from the site of the infection.  Deeper or more widespread soft tissue infections are usually treated with surgery to drain pus and with antibiotic medicine given by vein or by mouth. This may be recommended even if you are pregnant.  Serious infections may require a hospital stay. If antibiotics are given, they may be needed for several weeks. PREVENTION Because many people are colonized with staph, including MRSA, preventing the spread of the bacteria from person-to-person is most important. The best way to prevent the spread of bacteria and other germs is through proper hand washing or by using alcohol-based hand disinfectants. The following are other ways to help prevent MRSA infection within community settings.   Wash your hands frequently with soap and water for at least 15 seconds. Otherwise, use alcohol-based hand disinfectants when soap and water is not available.  Make sure people who live with you  wash their hands often, too.  Do not share personal items. For example, avoid sharing razors and other personal hygiene items, towels, clothing, and athletic equipment.  Wash and dry your clothes and bedding at the  warmest temperatures recommended on the labels.  Keep wounds covered. Pus from infected sores may contain MRSA and other bacteria. Keep cuts and abrasions clean and covered with germ-free (sterile), dry bandages until they are healed.  If you have a wound that appears infected, ask your caregiver if a culture for MRSA and other bacteria should be done.  If you are breastfeeding, talk to your caregiver about MRSA. You may be asked to temporarily stop breastfeeding. HOME CARE INSTRUCTIONS   Take your antibiotics as directed. Finish them even if you start to feel better.  Avoid close contact with those around you as much as possible. Do not use towels, razors, toothbrushes, bedding, or other items that will be used by others.  To fight the infection, follow your caregiver's instructions for wound care. Wash your hands before and after changing your bandages.  If you have an intravascular device, such as a catheter, make sure you know how to care for it.  Be sure to tell any healthcare providers that you have MRSA so they are aware of your infection. SEEK IMMEDIATE MEDICAL CARE IF:  The infection appears to be getting worse. Signs include:  Increased warmth, redness, or tenderness around the wound site.  A red line that extends from the infection site.  A dark color in the area around the infection.  Wound drainage that is tan, yellow, or green.  A bad smell coming from the wound.  You feel sick to your stomach (nauseous) and throw up (vomit) or cannot keep medicine down.  You have a fever.  Your baby is older than 3 months with a rectal temperature of 102 F (38.9 C) or higher.  Your baby is 56 months old or younger with a rectal temperature of 100.4 F (38 C) or higher.  You have difficulty breathing. MAKE SURE YOU:   Understand these instructions.  Will watch your condition.  Will get help right away if you are not doing well or get worse. Document Released: 10/25/2005  Document Revised: 10/14/2011 Document Reviewed: 10/25/2010 [facility] Patient Information 2014 [place]. MRSA Overview MRSA stands for methicillin-resistant Staphylococcus aureus. It is a type of bacteria that is resistant to some common antibiotics. It can cause infections in the skin and many other places in the body. Staphylococcus aureus, often called "staph," is a bacteria that normally lives on the skin or in the nose. Staph on the surface of the skin or in the nose does not cause problems. However, if the staph enters the body through a cut, wound, or break in the skin, an infection can happen. Up until recently, infections with the MRSA type of staph mainly occurred in hospitals and other healthcare settings. There are now increasing problems with MRSA infections in the community as well. Infections with MRSA may be very serious or even life-threatening. Most MRSA infections are acquired in one of two ways:  Healthcare-associated MRSA (HA-MRSA)  This can be acquired by people in any healthcare setting. MRSA can be a big problem for hospitalized people, people in nursing homes, people in rehabilitation facilities, people with weakened immune systems, dialysis patients, and those who have had surgery.  Community-associated MRSA (CA-MRSA)  Community spread of MRSA is becoming more common. It is known to spread in crowded settings, in  jails and prisons, and in situations where there is close skin-to-skin contact, such as during sporting events or in locker rooms. MRSA can be spread through shared items, such as children's toys, razors, towels, or sports equipment. CAUSES  All staph, including MRSA, are normally harmless unless they enter the body through a scratch, cut, or wound, such as with surgery. All staph, including MRSA, can be spread from person-to-person by touching contaminated objects or through direct contact. SPECIAL GROUPS MRSA can present problems for special groups of people.  Some of these groups include:  Breastfeeding women.  The most common problem is MRSA infection of the breast (mastitis). There is evidence that MRSA can be passed to an infant from infected breast milk. Your caregiver may recommend that you stop breastfeeding until the mastitis is under control.  If you are breastfeeding and have a MRSA infection in a place other than the breast, you may usually continue breastfeeding while under treatment. If taking antibiotics, ask your caregiver if it is safe to continue breastfeeding while taking your prescribed medicines.  Neonates (babies from birth to 37 month old) and infants (babies from 62 month to 92 year old).  There is evidence that MRSA can be passed to a newborn at birth if the mother has MRSA on the skin, in or around the birth canal, or an infection in the uterus, cervix, or vagina. MRSA infection can have the same appearance as a normal newborn or infant rash or several other skin infections. This can make it hard to diagnose MRSA.  Immune compromised people.  If you have an immune system problem, you may have a higher chance of developing a MRSA infection.  People after any type of surgery.  Staph in general, including MRSA, is the most common cause of infections occurring at the site of recent surgery.  People on long-term steroid medicines.  These kinds of medicines can lower your resistance to infection. This can increase your chance of getting MRSA.  People who have had frequent hospitalizations, live in nursing homes or other residential care facilities, have venous or urinary catheters, or have taken multiple courses of antibiotic therapy for any reason. DIAGNOSIS  Diagnosis of MRSA is done by cultures of fluid samples that may come from:  Swabs taken from cuts or wounds in infected areas.  Nasal swabs.  Saliva or deep cough specimens from the lungs (sputum).  Urine.  Blood. Many people are "colonized" with MRSA but have no  signs of infection. This means that people carry the MRSA germ on their skin or in their nose and may never develop MRSA infection.  TREATMENT  Treatment varies and is based on how serious, how deep, or how extensive the infection is. For example:  Some skin infections, such as a small boil or abscess, may be treated by draining yellowish-white fluid (pus) from the site of the infection.  Deeper or more widespread soft tissue infections are usually treated with surgery to drain pus and with antibiotic medicine given by vein or by mouth. This may be recommended even if you are pregnant.  Serious infections may require a hospital stay. If antibiotics are given, they may be needed for several weeks. PREVENTION  Because many people are colonized with staph, including MRSA, preventing the spread of the bacteria from person-to-person is most important. The best way to prevent the spread of bacteria and other germs is through proper hand washing or by using alcohol-based hand disinfectants. The following are other ways to  help prevent MRSA infection within the hospital and community settings.   Healthcare settings:  Strict hand washing or hand disinfection procedures need to be followed before and after touching every patient.  Patients infected with MRSA are placed in isolation to prevent the spread of the bacteria.  Healthcare workers need to wear disposable gowns and gloves when touching or caring for patients infected with MRSA. Visitors may also be asked to wear a gown and gloves.  Hospital surfaces need to be disinfected frequently.  Community settings:  [employer] frequently with soap and water for at least 15 seconds. Otherwise, use alcohol-based hand disinfectants when soap and water is not available.  Make sure people who live with you wash their hands often, too.  Do not share personal items. For example, avoid sharing razors and other personal hygiene items, towels, clothing,  and athletic equipment.  Wash and dry your clothes and bedding at the warmest temperatures recommended on the labels.  Keep wounds covered. Pus from infected sores may contain MRSA and other bacteria. Keep cuts and abrasions clean and covered with germ-free (sterile), dry bandages until they are healed.  If you have a wound that appears infected, ask your caregiver if a culture for MRSA and other bacteria should be done.  If you are breastfeeding, talk to your caregiver about MRSA. You may be asked to temporarily stop breastfeeding. HOME CARE INSTRUCTIONS   Take your antibiotics as directed. Finish them even if you start to feel better.  Avoid close contact with those around you as much as possible. Do not use towels, razors, toothbrushes, bedding, or other items that will be used by others.  To fight the infection, follow your caregiver's instructions for wound care. Wash your hands before and after changing your bandages.  If you have an intravascular device, such as a catheter, make sure you know how to care for it.  Be sure to tell any healthcare providers that you have MRSA so they are aware of your infection. SEEK IMMEDIATE MEDICAL CARE IF:   The infection appears to be getting worse. Signs include:  Increased warmth, redness, or tenderness around the wound site.  A red line that extends from the infection site.  A dark color in the area around the infection.  Wound drainage that is tan, yellow, or green.  A bad smell coming from the wound.  You feel sick to your stomach (nauseous) and throw up (vomit) or cannot keep medicine down.  You have a fever.  Your baby is older than 3 months with a rectal temperature of 102 F (38.9 C) or higher.  Your baby is 34 months old or younger with a rectal temperature of 100.4 F (38 C) or higher.  You have difficulty breathing. MAKE SURE YOU:   Understand these instructions.  Will watch your condition.  Will get help right  away if you are not doing well or get worse. Document Released: 07/22/2005 Document Revised: 10/14/2011 Document Reviewed: 10/24/2010 [facility] Patient Information 2014 [place], [place].

## 2013-01-05 NOTE — Progress Notes (Signed)
  Subjective:    Patient ID: [name], female    DOB: 1971-05-27, 42 y.o.   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  Currently in hospital.  Client did not have skin infections.  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    Koree notices she's had increasing scabs present for years, acutely more frequent over the last few weeks.  Easily scabs with scratching skin, itchy skin.  States blood sugars have been overall improved recently. Worried about implications of MRSA exposure with other clients she has (has one who's had a lung transplant, on immunosuppressant therapy) and for children.   Past Medical History  Diagnosis Date  . Type 1 diabetes, uncontrolled, with neuropathy   . Mood disorder   . Arthritis   . Migraines   . History of chicken pox      Review of Systems Per HPI    Objective:   Physical Exam  Nursing note and vitals reviewed. Constitutional: She appears well-developed and well-nourished. No distress.  Skin:  Hyperpigmented macules on bilateral lower legs from old scars. Newer scabs on bilateral lower legs around ankles with slight erythema.  No discharge or abscess or fluctuance or induration of lesions       Assessment & Plan:

## 2013-01-15 ENCOUNTER — Ambulatory Visit (INDEPENDENT_AMBULATORY_CARE_PROVIDER_SITE_OTHER): Payer: BC Managed Care – PPO | Admitting: Cardiovascular Disease

## 2013-01-15 ENCOUNTER — Encounter: Payer: Self-pay | Admitting: Cardiovascular Disease

## 2013-01-15 VITALS — BP 128/78 | HR 87 | Ht 71.0 in | Wt 168.5 lb

## 2013-01-15 DIAGNOSIS — Z8249 Family history of ischemic heart disease and other diseases of the circulatory system: Secondary | ICD-10-CM

## 2013-01-15 DIAGNOSIS — R079 Chest pain, unspecified: Secondary | ICD-10-CM

## 2013-01-15 DIAGNOSIS — R0602 Shortness of breath: Secondary | ICD-10-CM

## 2013-01-15 NOTE — Assessment & Plan Note (Signed)
Chest pain is overall atypical and could be related to stress and anxiety. However, she has prolonged history of uncontrolled diabetes as well as family history of premature coronary artery disease. She reports having an abnormal stress test 12 years ago in [place]. Baseline EKG with nonspecific T wave changes. Due to all of that, I recommend proceeding with a treadmill nuclear stress test for evaluation. I discussed with the patient the importance of lifestyle changes in order to decrease the chance of future coronary artery disease and cardiovascular events. We discussed the importance of controlling risk factors, healthy diet as well as regular exercise. I also explained to him that a normal stress test does not rule out atherosclerosis.

## 2013-01-15 NOTE — Progress Notes (Signed)
HPI  This is a pleasant 42 year old African American female who was referred by Dr. Danise Mina for evaluation of chest pain. The patient reports having an abnormal stress test 12 years ago in [place] but never had cardiac catheterization. She has prolonged history of type 1 diabetes which was diagnosed about 17 years ago. She had gestational diabetes. She lost her medical insurance for about 3 years and was without treatment. Her diabetes has not been well controlled with a recent hemoglobin A1c of 11.4. She has diabetic neuropathy but no evidence of other end organ damage related to diabetes. She has no history of hypertension, hyperlipidemia or tobacco use. There is strong family history of premature coronary artery disease on her mother's side. She has been having intermittent substernal chest discomfort without radiation which usually last for about 5 minutes and mostly happens at rest when she is in no distress. She suffers from anxiety and possible panic attacks. She has mild exertional dyspnea but no orthopnea, PND or lower extremity edema.  Allergies  Allergen Reactions  . Gabapentin Other (See Comments)    Dizziness     Current Outpatient Prescriptions on File Prior to Visit  Medication Sig Dispense Refill  . aspirin EC 81 MG tablet Take 81 mg by mouth daily.      . Blood Glucose Monitoring Suppl (ONE TOUCH ULTRA SYSTEM KIT) W/DEVICE KIT 1 kit by Does not apply route once.  1 each  0  . doxycycline (VIBRAMYCIN) 100 MG capsule Take 1 capsule (100 mg total) by mouth 2 (two) times daily.  20 capsule  0  . ergocalciferol (VITAMIN D2) 50000 UNITS capsule Take 1 capsule (50,000 Units total) by mouth once a week.  8 capsule  1  . fluconazole (DIFLUCAN) 150 MG tablet Take 1 tablet (150 mg total) by mouth once.  1 tablet  0  . glucagon (GLUCAGON EMERGENCY) 1 MG injection Inject 1 mg into the muscle once as needed.  2 each  prn  . glucose blood test strip Use as advised to check sugars 3-4x a  day  200 each  11  . insulin glargine (LANTUS) 100 units/mL SOLN Inject 35 Units into the skin every morning.  15 mL  11  . Insulin Lispro, Human, (HUMALOG KWIKPEN [place]) Inject 5 Units into the skin 3 (three) times daily. With meals.  Additional 2 units sliding scale if over 150      . Insulin Pen Needle (PEN NEEDLES 3/16") 31G X 5 MM MISC 1 each by Does not apply route 4 (four) times daily.  200 each  11  . L-Methylfolate-Algae-B12-B6 (METANX) 3-90.314-2-35 MG CAPS Take 1 capsule by mouth 2 (two) times daily.      Marland Kitchen LORazepam (ATIVAN) 0.5 MG tablet Take 0.5-1 tablets (0.25-0.5 mg total) by mouth 2 (two) times daily as needed for anxiety.  30 tablet  0  . ONE TOUCH LANCETS MISC Use as advised to check sugars 3-4x a day  200 each  11  . pregabalin (LYRICA) 75 MG capsule Take 1 capsule (75 mg total) by mouth 2 (two) times daily.  60 capsule  3   No current facility-administered medications on file prior to visit.     Past Medical History  Diagnosis Date  . Type 1 diabetes, uncontrolled, with neuropathy   . Mood disorder   . Arthritis   . Migraines   . History of chicken pox      Past Surgical History  Procedure Laterality Date  .  No past surgeries       Family History  Problem Relation Age of Onset  . Diabetes Mother     type 2  . Hypertension Mother   . Thyroid disease Mother   . Cancer Maternal Grandmother     Breast   . Cancer Paternal Grandmother     stomach  . Diabetes Paternal Grandmother   . Cancer Paternal Grandfather     lung, stomach (smoker)  . Diabetes Paternal Grandfather   . Bipolar disorder Mother   . Cancer Mother     breast  . CAD Sister     stent  . Stroke Maternal Aunt   . Cancer Maternal Uncle     prostate  . CAD Maternal Aunt     stents  . Cancer Maternal Aunt     ovarian     History   Social History  . Marital Status: Married    Spouse Name: N/A    Number of Children: N/A  . Years of Education: N/A   Occupational History  . Not on  file.   Social History Main Topics  . Smoking status: Never Smoker   . Smokeless tobacco: Never Used  . Alcohol Use: Yes     Comment: Occasional wine  . Drug Use: No  . Sexually Active: Yes -- Female partner(s)   Other Topics Concern  . Not on file   Social History Narrative   Regular exercise: yes, active   Caffeine use: daily     ROS Constitutional: Negative for fever, chills, diaphoresis, activity change, appetite change and fatigue.  HENT: Negative for hearing loss, nosebleeds, congestion, sore throat, facial swelling, drooling, trouble swallowing, neck pain, voice change, sinus pressure and tinnitus.  Eyes: Negative for photophobia, pain, discharge and visual disturbance.  Respiratory: Negative for apnea, cough and wheezing.  Cardiovascular: Negative for  palpitations and leg swelling.  Gastrointestinal: Negative for nausea, vomiting, abdominal pain, diarrhea, constipation, blood in stool and abdominal distention.  Genitourinary: Negative for dysuria, urgency, frequency, hematuria and decreased urine volume.  Musculoskeletal: Negative for myalgias, back pain, joint swelling, arthralgias and gait problem.  Skin: Negative for color change, pallor, rash and wound.  Neurological: Negative for dizziness, tremors, seizures, syncope, speech difficulty, weakness, light-headedness, numbness and headaches.  Psychiatric/Behavioral: Negative for suicidal ideas, hallucinations, behavioral problems and agitation. The patient is not nervous/anxious.     PHYSICAL EXAM   BP 128/78  Pulse 87  Ht $R'5\' 11"'AN$  (1.803 m)  Wt 168 lb 8 oz (76.431 kg)  BMI 23.51 kg/m2  LMP 01/01/2013 Constitutional: She is oriented to person, place, and time. She appears well-developed and well-nourished. No distress.  HENT: No nasal discharge.  Head: Normocephalic and atraumatic.  Eyes: Pupils are equal and round. Right eye exhibits no discharge. Left eye exhibits no discharge.  Neck: Normal range of motion.  Neck supple. No JVD present. No thyromegaly present.  Cardiovascular: Normal rate, regular rhythm, normal heart sounds. Exam reveals no gallop and no friction rub. No murmur heard.  Pulmonary/Chest: Effort normal and breath sounds normal. No stridor. No respiratory distress. She has no wheezes. She has no rales. She exhibits no tenderness.  Abdominal: Soft. Bowel sounds are normal. She exhibits no distension. There is no tenderness. There is no rebound and no guarding.  Musculoskeletal: Normal range of motion. She exhibits no edema and no tenderness.  Neurological: She is alert and oriented to person, place, and time. Coordination normal.  Skin: Skin is warm and dry. No rash  noted. She is not diaphoretic. No erythema. No pallor.  Psychiatric: She has a normal mood and affect. Her behavior is normal. Judgment and thought content normal.     EKG: Sinus  Rhythm  -  nonspecific T-abnormality.   ABNORMAL     ASSESSMENT AND PLAN

## 2013-01-15 NOTE — Patient Instructions (Addendum)
[place]  Your caregiver has ordered a Stress Test with nuclear imaging. The purpose of this test is to evaluate the blood supply to your heart muscle. This procedure is referred to as a "Non-Invasive Stress Test." This is because other than having an IV started in your vein, nothing is inserted or "invades" your body. Cardiac stress tests are done to find areas of poor blood flow to the heart by determining the extent of coronary artery disease (CAD). Some patients exercise on a treadmill, which naturally increases the blood flow to your heart, while others who are  unable to walk on a treadmill due to physical limitations have a pharmacologic/chemical stress agent called Lexiscan . This medicine will mimic walking on a treadmill by temporarily increasing your coronary blood flow.   Please note: these test may take anywhere between 2-4 hours to complete  PLEASE REPORT TO [facility] AT THE FIRST DESK WILL DIRECT YOU WHERE TO GO  Date of Procedure:__________6/18/14___________________________  Arrival Time for Procedure:_______7:45 am_______________________  Instructions regarding medication:   ___x_ : Hold diabetes medication morning of procedure  n/a____:  Hold betablocker(s) night before procedure and morning of procedure  n/a____:  Hold other medications as follows:_________________________________________________________________________________________________________________________________________________________________________________________________________________________________________________________________________________________  PLEASE NOTIFY THE OFFICE AT LEAST 24 HOURS IN ADVANCE IF YOU ARE UNABLE TO KEEP YOUR APPOINTMENT.  [phone]  How to prepare for your Myoview test:  1. Do not eat or drink after midnight 2. No caffeine for 24 hours prior to test 3. Your medication may be taken with water.  If your doctor stopped a medication  because of this test, do not take that medication. 4. Ladies, please do not wear dresses.  Skirts or pants are appropriate. Please wear a short sleeve shirt. 5. No perfume, cologne or lotion. 6. Wear comfortable walking shoes. No heels!

## 2013-01-19 ENCOUNTER — Ambulatory Visit: Payer: BC Managed Care – PPO | Admitting: Internal Medicine

## 2013-01-20 ENCOUNTER — Other Ambulatory Visit: Payer: Self-pay

## 2013-01-20 ENCOUNTER — Ambulatory Visit: Payer: Self-pay | Admitting: Cardiovascular Disease

## 2013-01-20 DIAGNOSIS — R079 Chest pain, unspecified: Secondary | ICD-10-CM

## 2013-01-22 ENCOUNTER — Other Ambulatory Visit: Payer: Self-pay | Admitting: Family Medicine

## 2013-01-22 ENCOUNTER — Encounter: Payer: Self-pay | Admitting: Family Medicine

## 2013-01-22 NOTE — Telephone Encounter (Signed)
OK to refill

## 2013-01-22 NOTE — Telephone Encounter (Signed)
plz phone in. 

## 2013-01-22 NOTE — Telephone Encounter (Signed)
Rx called in as directed.   

## 2013-01-25 ENCOUNTER — Telehealth: Payer: Self-pay | Admitting: Family Medicine

## 2013-01-25 NOTE — Telephone Encounter (Signed)
Dr Varney Biles office, Psychiatry called to let  you know that your patient was a No Show for a New Pt today 01/25/13 at their office.

## 2013-01-26 ENCOUNTER — Ambulatory Visit: Payer: BC Managed Care – PPO | Admitting: Family Medicine

## 2013-01-27 NOTE — Telephone Encounter (Signed)
Noted  

## 2013-02-11 ENCOUNTER — Other Ambulatory Visit: Payer: Self-pay

## 2013-02-19 ENCOUNTER — Ambulatory Visit: Payer: BC Managed Care – PPO | Admitting: Internal Medicine

## 2013-03-01 ENCOUNTER — Ambulatory Visit: Payer: BC Managed Care – PPO | Admitting: Family Medicine

## 2013-03-01 DIAGNOSIS — Z0289 Encounter for other administrative examinations: Secondary | ICD-10-CM

## 2013-03-09 ENCOUNTER — Ambulatory Visit: Payer: Self-pay | Admitting: Internal Medicine

## 2013-04-08 ENCOUNTER — Ambulatory Visit (INDEPENDENT_AMBULATORY_CARE_PROVIDER_SITE_OTHER): Payer: BC Managed Care – PPO | Admitting: Family Medicine

## 2013-04-08 ENCOUNTER — Encounter: Payer: Self-pay | Admitting: Family Medicine

## 2013-04-08 ENCOUNTER — Ambulatory Visit: Payer: BC Managed Care – PPO | Admitting: Family Medicine

## 2013-04-08 VITALS — BP 110/78 | HR 88 | Temp 98.3°F | Wt 164.5 lb

## 2013-04-08 DIAGNOSIS — R21 Rash and other nonspecific skin eruption: Secondary | ICD-10-CM

## 2013-04-08 MED ORDER — LORAZEPAM 0.5 MG PO TABS: ORAL_TABLET | ORAL | Status: DC

## 2013-04-08 MED ORDER — HYDROXYZINE HCL 25 MG PO TABS: 25.0000 mg | ORAL_TABLET | Freq: Three times a day (TID) | ORAL | Status: DC | PRN

## 2013-04-08 MED ORDER — CLOBETASOL PROPIONATE 0.05 % EX CREA: TOPICAL_CREAM | Freq: Two times a day (BID) | CUTANEOUS | Status: DC

## 2013-04-08 NOTE — Assessment & Plan Note (Addendum)
Extremely pruritic papules on body with central punctum? likely bug bites - bedbug vs flea. Treat with hydroxyzine , oatmeal bath, and temovate steroid cream. Recommended inspect mattress, wash all beddings, check cat. Avoid oral steroid 2/2 h/o uncontrolled T1DM. discussed avoiding steroid on open lesions, rather use triple abx ointment for these If not improved, refer to derm for further evaluation.  Pt agrees with plan.  Lab Results  Component Value Date   HGBA1C 11.4* 12/02/2012

## 2013-04-08 NOTE — Progress Notes (Signed)
Subjective:    Patient ID: [name], female    DOB: Nov 12, 1970, 42 y.o.   MRN: [id number]  HPI CC: rash  5d h/o itchy rash that started as bumps on arms and legs. So far has tried benadryl which isn't helping. Also washed all beddings.  Hasn't found any insects recently. No one else at home with itching/rash. No recent travel or hotel stays. No new foods. No new medicines, lotions, detergents, soaps or shampoos. She does have cat at home who sleeps at head of bed  A few days back felt ill, generalized maliase. Denies oral lesions, no fevers/chills, abd pain, nausea, recent viral URI sxs. Chronic joint pains.  Medications and allergies reviewed and updated in chart.  Past histories reviewed and updated if relevant as below. Patient Active Problem List   Diagnosis Date Noted  . Skin lesion 01/05/2013  . Contraception management 12/31/2012  . Chest pain 12/30/2012  . Mood disorder 12/30/2012  . Unspecified vitamin D deficiency 12/03/2012  . H/O thyroid nodule 12/03/2012  . Type 1 diabetes mellitus with diabetic neuropathy 06/18/2007   Past Medical History  Diagnosis Date  . Type 1 diabetes, uncontrolled, with neuropathy   . Mood disorder   . Arthritis   . Migraines   . History of chicken pox    Past Surgical History  Procedure Laterality Date  . Treadmill stress test  01/2013    WNL, low risk study   History  Substance Use Topics  . Smoking status: Never Smoker   . Smokeless tobacco: Never Used  . Alcohol Use: Yes     Comment: Occasional wine   Family History  Problem Relation Age of Onset  . Diabetes Mother     type 2  . Hypertension Mother   . Thyroid disease Mother   . Cancer Maternal Grandmother     Breast   . Cancer Paternal Grandmother     stomach  . Diabetes Paternal Grandmother   . Cancer Paternal Grandfather     lung, stomach (smoker)  . Diabetes Paternal Grandfather   . Bipolar disorder Mother   . Cancer Mother     breast  .  CAD Sister     stent  . Stroke Maternal Aunt   . Cancer Maternal Uncle     prostate  . CAD Maternal Aunt     stents  . Cancer Maternal Aunt     ovarian   Allergies  Allergen Reactions  . Gabapentin Other (See Comments)    Dizziness   Current Outpatient Prescriptions on File Prior to Visit  Medication Sig Dispense Refill  . aspirin EC 81 MG tablet Take 81 mg by mouth daily.      . Blood Glucose Monitoring Suppl (ONE TOUCH ULTRA SYSTEM KIT) W/DEVICE KIT 1 kit by Does not apply route once.  1 each  0  . ergocalciferol (VITAMIN D2) 50000 UNITS capsule Take 1 capsule (50,000 Units total) by mouth once a week.  8 capsule  1  . glucose blood test strip Use as advised to check sugars 3-4x a day  200 each  11  . insulin glargine (LANTUS) 100 units/mL SOLN Inject 35 Units into the skin every morning.  15 mL  11  . Insulin Lispro, Human, (HUMALOG KWIKPEN [place]) Inject 5 Units into the skin 3 (three) times daily. With meals.  Additional 2 units sliding scale if over 150      . Insulin Pen Needle (PEN NEEDLES 3/16") 31G X 5 MM MISC  1 each by Does not apply route 4 (four) times daily.  200 each  11  . L-Methylfolate-Algae-B12-B6 (METANX) 3-90.314-2-35 MG CAPS Take 1 capsule by mouth 2 (two) times daily.      . ONE TOUCH LANCETS MISC Use as advised to check sugars 3-4x a day  200 each  11  . pregabalin (LYRICA) 75 MG capsule Take 1 capsule (75 mg total) by mouth 2 (two) times daily.  60 capsule  3  . glucagon (GLUCAGON EMERGENCY) 1 MG injection Inject 1 mg into the muscle once as needed.  2 each  prn   No current facility-administered medications on file prior to visit.     Review of Systems Per HPI    Objective:   Physical Exam  Nursing note and vitals reviewed. Constitutional: She appears well-developed and well-nourished. No distress.  Skin: Rash noted.  Erythematous pruritic bumps on L posterior arm, and diffusely throughout legs.  None on wrists or between digits. Hyperpigmented scarred  macules throughout bilateral legs. Excoriations throughout body especially dorsal feet to the point of broken skin       Assessment & Plan:

## 2013-04-08 NOTE — Patient Instructions (Addendum)
Check mattress for bed bugs This could be bug bites or sensitive skin allergy. Start steroid cream for itchy bumps.   Hydroxyzine instead of benadryl for itching Oatmeal bath. If not better with this, let us know for dermatology referral. Avoid steroid cream on open spots in skin - rather use triple antibiotic ointment.

## 2013-05-01 ENCOUNTER — Ambulatory Visit (INDEPENDENT_AMBULATORY_CARE_PROVIDER_SITE_OTHER): Payer: BC Managed Care – PPO | Admitting: Family Medicine

## 2013-05-01 ENCOUNTER — Encounter: Payer: Self-pay | Admitting: Family Medicine

## 2013-05-01 VITALS — BP 120/90 | HR 78 | Temp 96.6°F | Ht 71.5 in | Wt 165.8 lb

## 2013-05-01 DIAGNOSIS — E1169 Type 2 diabetes mellitus with other specified complication: Secondary | ICD-10-CM

## 2013-05-01 DIAGNOSIS — E11621 Type 2 diabetes mellitus with foot ulcer: Secondary | ICD-10-CM

## 2013-05-01 DIAGNOSIS — E1040 Type 1 diabetes mellitus with diabetic neuropathy, unspecified: Secondary | ICD-10-CM

## 2013-05-01 DIAGNOSIS — L97509 Non-pressure chronic ulcer of other part of unspecified foot with unspecified severity: Secondary | ICD-10-CM

## 2013-05-01 DIAGNOSIS — G589 Mononeuropathy, unspecified: Secondary | ICD-10-CM

## 2013-05-01 DIAGNOSIS — R21 Rash and other nonspecific skin eruption: Secondary | ICD-10-CM

## 2013-05-01 DIAGNOSIS — E1049 Type 1 diabetes mellitus with other diabetic neurological complication: Secondary | ICD-10-CM

## 2013-05-01 MED ORDER — SULFAMETHOXAZOLE-TMP DS 800-160 MG PO TABS: 2.0000 | ORAL_TABLET | Freq: Two times a day (BID) | ORAL | Status: DC

## 2013-05-01 MED ORDER — FLUCONAZOLE 150 MG PO TABS: 150.0000 mg | ORAL_TABLET | Freq: Once | ORAL | Status: DC

## 2013-05-01 MED ORDER — HYDROXYZINE HCL 25 MG PO TABS: 25.0000 mg | ORAL_TABLET | Freq: Three times a day (TID) | ORAL | Status: DC | PRN

## 2013-05-01 NOTE — Assessment & Plan Note (Signed)
Unclear initial cause of puritic erythematous nodules... Refilled atarax. Refer to derm for further eval.

## 2013-05-01 NOTE — Assessment & Plan Note (Signed)
Unclear initial cause of rash ... ? Related to neuropathy itself. Now with ulcers B feet, non healing.  Concern for infection... Areas debrided, culture obtained. Start on Antibotic to cover MRSa.  Refer to wound care center.

## 2013-05-01 NOTE — Patient Instructions (Addendum)
Start and complete 2 tabs twice daily of sulfa  Antibiotic. We will call you with culture results. Call on Monday to get set up with derm referral and wound care center.  Follow up with PCP or myself within next week.

## 2013-05-01 NOTE — Progress Notes (Signed)
  Subjective:    Patient ID: [name], female    DOB: 12-Sep-1970, 42 y.o.   MRN: [id number]  HPI   42 year old female with diabetes with peripheral neuropathy, pt of Dr. Leo Grosser, presents to Saturday clinic for follow up on hronic rash on feet.   She was initially seen on 9/4 by PCP: For itchy rash that started as bumps on arms and legs.  So far has tried benadryl which isn't helping.  Also washed all beddings.  Hasn't found any insects recently.  No one else at home with itching/rash.  No recent travel or hotel stays.  No new foods. No new medicines, lotions, detergents, soaps or shampoos.  She does have cat at home who sleeps at head of bed  A few days back felt ill, generalized maliase.  Denies oral lesions, no fevers/chills, abd pain, nausea, recent viral URI sxs.  Chronic joint pains.   PCP recommended:  Skin rash - Ria Bush, MD at 04/08/2013 3:30 PM   Status: [name] Related Problem: Skin rash        Extremely pruritic papules on body with central punctum? likely bug bites - bedbug vs flea.  Treat with hydroxyzine , oatmeal bath, and temovate steroid cream.  Recommended inspect mattress, wash all beddings, check cat.  Avoid oral steroid 2/2 h/o uncontrolled T1DM.  discussed avoiding steroid on open lesions, rather use triple abx ointment for these  If not improved, refer to derm for further evaluation. Pt agrees with plan.     Since then she has  continue rash on B feet more open lesions, itchy red bumps don't heal.  She has a couple  Itchy lesions on B arms, but small and nonhealing.  She sees a thick green yellow discharge. She has not used any other treatment.  She has been exposed to MRSA several time in last year.   She had initial issues with scabs on legs since June... Was treated prophylactic ally at that time by  Dr. Darnell Level with doxy x 10 days.     Review of Systems  Constitutional: Negative for fever and fatigue.  Eyes: Negative for pain.   Respiratory: Negative for shortness of breath.   Cardiovascular: Negative for chest pain.       Objective:   Physical Exam  Constitutional: She appears well-developed and well-nourished.  Neck: Normal range of motion.  Cardiovascular: Normal rate and regular rhythm.   No murmur heard. Pulmonary/Chest: Effort normal and breath sounds normal.  Abdominal: Soft. Bowel sounds are normal.  Neurological: A sensory deficit is present.  Decreased sensation to touch B feet.. Able to debride with no pain  Skin:  Multiple hyperpigemented lesions with central eschar  B ankles, largest one 2 cm x 1 cm anterior left ankle  Largest one debrided... Viable granulation tissue underneath, culture obtained Some odor and yellow pus discharge          Assessment & Plan:

## 2013-05-01 NOTE — Assessment & Plan Note (Signed)
Lab Results  Component Value Date   HGBA1C 11.4* 12/02/2012   Needs better DM control. Severe neuropahty.

## 2013-05-03 ENCOUNTER — Other Ambulatory Visit: Payer: Self-pay | Admitting: Internal Medicine

## 2013-05-03 ENCOUNTER — Telehealth: Payer: Self-pay

## 2013-05-03 ENCOUNTER — Telehealth: Payer: Self-pay | Admitting: Family Medicine

## 2013-05-03 NOTE — Telephone Encounter (Signed)
Pt seen at Saturday clinic and referred to derm and wound care.

## 2013-05-03 NOTE — Telephone Encounter (Signed)
[name], can you please enter Apidra same doses? Thank you.

## 2013-05-03 NOTE — Telephone Encounter (Signed)
Patient would like to look into switching to Apidra because it is preferred with her insurance and would be $0 instead of a $50 copay. Please advise.

## 2013-05-03 NOTE — Telephone Encounter (Signed)
Pt requesting refill l[facility]azepam to [facility]; spoke with [facility]; pt has available refill and will get ready for pt to pick up. Pt notified as instructed. Pt also wanted to ck on status of wound care center and dermatology referral done on 05/01/13. Vaughan Basta T with a pt and will call pt at [phone].

## 2013-05-03 NOTE — Telephone Encounter (Signed)
[facility] Triage Call Report Triage Record Num: [id number] Operator: Winona Legato Patient Name: [name] Call Date & Time: 04/30/2013 4:37:39PM Patient Phone: PCP: Ria Bush Patient Gender: Female PCP Fax : [phone] Patient DOB: 1970/10/14 Practice Name: [facility] Reason for Call: Caller: Katye/Patient; PCP: Ria Bush [facility]); CB#: [phone]; Call regarding Pain in both feet; Pt calling regarding pain in both of her feet. Seen on 04/08/13 for rash. Pt now states she has clawed herself until she has open wounds that now look infected. Yellow and green coming from open wounds. Afebrile. Not treating. See ED Immediately for: Wound that is jagged or irregular or with obvious tissue loss per Abrasions, Lacerations, Puncture Wounds protocol. Advised pt she should go to ER for further assessment. Pt declined. Has appt for 1015 at [facility] office on 05/01/13. Would rather wait until then. Care advice given with emergent/call back paremeters. Protocol(s) Used: Abrasions, Lacerations, Puncture Wounds Protocol(s) Used: Skin Lesions Recommended Outcome per Protocol: See ED Immediately Reason for Outcome: Skin tear(s) caused by friction/shear injury Wound that is jagged or irregular or with obvious tissue loss Care Advice: ~ Another adult should drive. ~ IMMEDIATE ACTION 04/30/2013 4:59:07PM Page 1 of 1 [email or web address]

## 2013-05-04 ENCOUNTER — Telehealth: Payer: Self-pay | Admitting: *Deleted

## 2013-05-04 NOTE — Telephone Encounter (Signed)
[facility] request response to request of changing Novolog which has a $50.00 copay to Apidra which is preferred and has a $0 copay. [facility] will contact Dr Cruzita Lederer at [phone].

## 2013-05-04 NOTE — Telephone Encounter (Signed)
OK to change to Apidra.

## 2013-05-04 NOTE — Telephone Encounter (Signed)
Pharmacist, Hildred Alamin from [facility] [phone]) called stating that they have been working on helping pt with her cost of insulin. She stated that the pt can get Apidra with a cost-card, for nothing. Is this medication an option for the pt. Please advise.

## 2013-05-05 ENCOUNTER — Ambulatory Visit: Payer: BC Managed Care – PPO

## 2013-05-05 ENCOUNTER — Other Ambulatory Visit: Payer: Self-pay | Admitting: *Deleted

## 2013-05-05 MED ORDER — INSULIN GLULISINE 100 UNIT/ML SOLOSTAR PEN: 15.0000 [IU] | PEN_INJECTOR | Freq: Every day | SUBCUTANEOUS | Status: DC

## 2013-05-05 NOTE — Telephone Encounter (Signed)
Called the pharmacy, [facility], and told them it is ok to change pt to Apidra, per Dr Cruzita Lederer. Same dosage.

## 2013-05-11 ENCOUNTER — Encounter: Payer: Self-pay | Admitting: Cardiothoracic Surgery

## 2013-05-11 ENCOUNTER — Telehealth: Payer: Self-pay

## 2013-05-11 NOTE — Telephone Encounter (Signed)
Results received and placed in Dr Synthia Innocent inbox. They have not been scanned in emr yet.

## 2013-05-11 NOTE — Telephone Encounter (Addendum)
Pt left v/m requesting would culture done 05/01/13. Under lab tab status still listed as future.Please advise. Spoke with Tasha in lab and she will ck on status of wound culture (was done [place] office Sat. Clinic).

## 2013-05-11 NOTE — Telephone Encounter (Signed)
Patient notified. She said the wounds are still present and no better. She said she couldn't get into derm until November, but she went to the wound center at [facility] today. I called them to let them know the results of the culture. She said the wounds are extremely itchy and bothersome and doesn't know if the course of abx will be long enough. I advised to see what the wound center advises and just go from there. She was agreeable.

## 2013-05-11 NOTE — Telephone Encounter (Signed)
I called [facility] and results are being faxed.

## 2013-05-11 NOTE — Telephone Encounter (Signed)
plz notify patient that wound culture returned showing staph aureus, but not MRSA. Sensitive to antibiotic chosen so should have helped - how are wounds and when is derm appointment? I have asked to scan wound culture into chart.

## 2013-05-12 ENCOUNTER — Ambulatory Visit (INDEPENDENT_AMBULATORY_CARE_PROVIDER_SITE_OTHER): Payer: BC Managed Care – PPO | Admitting: Family Medicine

## 2013-05-12 ENCOUNTER — Ambulatory Visit (INDEPENDENT_AMBULATORY_CARE_PROVIDER_SITE_OTHER)
Admission: RE | Admit: 2013-05-12 | Discharge: 2013-05-12 | Disposition: A | Discharge status: Home / Self Care | Payer: BC Managed Care – PPO | Source: Ambulatory Visit | Attending: Family Medicine | Admitting: Family Medicine

## 2013-05-12 ENCOUNTER — Encounter: Payer: Self-pay | Admitting: Family Medicine

## 2013-05-12 VITALS — BP 136/86 | HR 96 | Temp 98.1°F | Wt 166.5 lb

## 2013-05-12 DIAGNOSIS — L97509 Non-pressure chronic ulcer of other part of unspecified foot with unspecified severity: Secondary | ICD-10-CM

## 2013-05-12 DIAGNOSIS — E11621 Type 2 diabetes mellitus with foot ulcer: Secondary | ICD-10-CM

## 2013-05-12 DIAGNOSIS — E1169 Type 2 diabetes mellitus with other specified complication: Secondary | ICD-10-CM

## 2013-05-12 HISTORY — DX: Type 2 diabetes mellitus with foot ulcer: E11.621

## 2013-05-12 NOTE — Patient Instructions (Signed)
Xray now  Will get you a report in the am  Elevate foot as much as you can  Will make plan tomorrow and get in touch with the wound folks at [facility]

## 2013-05-12 NOTE — Telephone Encounter (Signed)
Agree for MD eval this afternoon. MSSA on wound culture.  It has been sent off for scanning yesterday, not at our office anymore.  I have asked to make it a priority scan so Dr. Glori Bickers hopefully has access to it this afternoon.

## 2013-05-12 NOTE — Progress Notes (Signed)
Subjective:    Patient ID: [name], female    DOB: March 12, 1971, 42 y.o.   MRN: [id number]  HPI Here for foot skin problem  Pt was seen on 9/27 by her PCP for intensely itchy rash on extremeties with notable excoriations She was treated with hydroxyzine/ temovate cream (abx oint on open areas) and adv oatmeal baths  No hx of bedbugs or exp to scabies or insects at the time  Since then - developed pain and infection symptoms on L foot -- painful   She has neuropathy also    Saw Dr B - and was tested for staph  Not MRSA On bactrim 4 pills daily for 10 days  Is now worse -no improvement at all   No fever   Ref to diabetic wound clinic - went on Monday - Dr Jenetta Downer - he incised it , and did not do another cx  At [facility]  Sees him back on Tuesday  inst not to get this wet - change bandage every 3 days  Did not put her on any new abx   Now is hurting a lot  Hurting deep   Her neuropathy- does make her tingle and burn (not numb)   Told not to change dressing until [employer] are being mailed to her house    Patient Active Problem List   Diagnosis Date Noted  . Diabetic ulcer of left foot 05/12/2013  . Diabetic foot ulcers 05/01/2013  . Skin rash 01/05/2013  . Contraception management 12/31/2012  . Chest pain 12/30/2012  . Mood disorder 12/30/2012  . Unspecified vitamin D deficiency 12/03/2012  . H/O thyroid nodule 12/03/2012  . Type 1 diabetes mellitus with diabetic neuropathy 06/18/2007   Past Medical History  Diagnosis Date  . Type 1 diabetes, uncontrolled, with neuropathy   . Mood disorder   . Arthritis   . Migraines   . History of chicken pox    Past Surgical History  Procedure Laterality Date  . Treadmill stress test  01/2013    WNL, low risk study   History  Substance Use Topics  . Smoking status: Never Smoker   . Smokeless tobacco: Never Used  . Alcohol Use: Yes     Comment: Occasional wine   Family History  Problem Relation Age of Onset   . Diabetes Mother     type 2  . Hypertension Mother   . Thyroid disease Mother   . Cancer Maternal Grandmother     Breast   . Cancer Paternal Grandmother     stomach  . Diabetes Paternal Grandmother   . Cancer Paternal Grandfather     lung, stomach (smoker)  . Diabetes Paternal Grandfather   . Bipolar disorder Mother   . Cancer Mother     breast  . CAD Sister     stent  . Stroke Maternal Aunt   . Cancer Maternal Uncle     prostate  . CAD Maternal Aunt     stents  . Cancer Maternal Aunt     ovarian   Allergies  Allergen Reactions  . Gabapentin Other (See Comments)    Dizziness   Current Outpatient Prescriptions on File Prior to Visit  Medication Sig Dispense Refill  . aspirin EC 81 MG tablet Take 81 mg by mouth daily.      . Blood Glucose Monitoring Suppl (ONE TOUCH ULTRA SYSTEM KIT) W/DEVICE KIT 1 kit by Does not apply route once.  1 each  0  . fluconazole (DIFLUCAN)  150 MG tablet Take 1 tablet (150 mg total) by mouth once.  1 tablet  0  . glucagon (GLUCAGON EMERGENCY) 1 MG injection Inject 1 mg into the muscle once as needed.  2 each  prn  . glucose blood test strip Use as advised to check sugars 3-4x a day  200 each  11  . hydrOXYzine (ATARAX/VISTARIL) 25 MG tablet Take 1 tablet (25 mg total) by mouth 3 (three) times daily as needed for itching.  30 tablet  0  . insulin glargine (LANTUS) 100 units/mL SOLN Inject 35 Units into the skin every morning.  15 mL  11  . Insulin Glulisine (APIDRA SOLOST[place]) 100 UNIT/ML SOPN Inject 15 Units into the skin daily. Inject 5 units into the skin 3 times daily with meals. Additional 2 units with sliding scale if readings over 150.  6 mL  3  . Insulin Pen Needle (PEN NEEDLES 3/16") 31G X 5 MM MISC 1 each by Does not apply route 4 (four) times daily.  200 each  11  . NOVOLOG FLEXPEN 100 UNIT/ML SOPN FlexPen INJECT INTO SKIN 8 UNITS IN THE MORNING, 12 UNITS AT LUNCH AND 7 UNITS AT DINNER.  10 mL  1  . ONE TOUCH LANCETS MISC Use as advised  to check sugars 3-4x a day  200 each  11  . pregabalin (LYRICA) 75 MG capsule Take 1 capsule (75 mg total) by mouth 2 (two) times daily.  60 capsule  3  . clobetasol cream (TEMOVATE) 0.05 % Apply topically 2 (two) times daily. Apply to AA  45 g  0  . L-Methylfolate-Algae-B12-B6 (METANX) 3-90.314-2-35 MG CAPS Take 1 capsule by mouth 2 (two) times daily.       No current facility-administered medications on file prior to visit.    Review of Systems Review of Systems  Constitutional: Negative for fever, appetite change, fatigue and unexpected weight change.  Eyes: Negative for pain and visual disturbance.  Respiratory: Negative for cough and shortness of breath.   Cardiovascular: Negative for cp or palpitations    Gastrointestinal: Negative for nausea, diarrhea and constipation.  Genitourinary: Negative for urgency and frequency.  Skin: Negative for pallor and pos for itchy rash/ pos for painful wounds on her foot  Neurological: Negative for weakness, light-headedness, numbness and headaches.  Hematological: Negative for adenopathy. Does not bruise/bleed easily.  Psychiatric/Behavioral: Negative for dysphoric mood. The patient is not nervous/anxious.         Objective:   Physical Exam  Constitutional: She appears well-developed and well-nourished. No distress.  HENT:  Head: Normocephalic and atraumatic.  Eyes: Conjunctivae and EOM are normal. Pupils are equal, round, and reactive to light. No scleral icterus.  Neck: Normal range of motion. Neck supple. No JVD present. Carotid bruit is not present. No thyromegaly present.  Cardiovascular: Regular rhythm and normal heart sounds.   Pulmonary/Chest: Effort normal and breath sounds normal.  Musculoskeletal: Normal range of motion. She exhibits tenderness. She exhibits no edema.  Lymphadenopathy:    She has no cervical adenopathy.  Skin: Skin is warm and dry.  L foot  Scarring from prev scratched papules 3 oval size dry / small ulcers  without drainage  Scant swelling but no redness surroudning those- but they are quite tender (dorsum of foot) Nl rom foot/ankle/toes perf intact   Psychiatric: She has a normal mood and affect.          Assessment & Plan:

## 2013-05-12 NOTE — Telephone Encounter (Signed)
Patient has called back today asking if she needs to be on an additional abx or something else. She is still in a lot of pain. She said she feels run down and achy. No fever but she still cannot bear weight on her foot. I made her an appt to be rechecked today with Dr. Glori Bickers, but told her if you didn't think she needed a recheck I would call her and cancel it. She said she never heard from wound center after I called them yesterday. I advised to call them as well.

## 2013-05-13 ENCOUNTER — Telehealth: Payer: Self-pay

## 2013-05-13 NOTE — Telephone Encounter (Signed)
Rosaria Ferries, pt care coordinator said pt notified about lt foot xray and is working on wound care referral.

## 2013-05-13 NOTE — Assessment & Plan Note (Signed)
Wounds on dorsum of L foot - from prev itchy rash - with hx of staph infx (not mrsa) with bactrim  Now no change in pain/ and has been seen at wound clinic at [facility] today to r/u osteomyelitis She will likely need earlier appt with wound clinic  Bandage changed today- dry non stick with tegaderm overlay  She will keep following inst from wound center and her supplies will be mailed to her house as planned

## 2013-05-14 ENCOUNTER — Telehealth: Payer: Self-pay | Admitting: Family Medicine

## 2013-05-14 NOTE — Telephone Encounter (Signed)
In your IN box for completion.  

## 2013-05-14 NOTE — Telephone Encounter (Signed)
[name] dropped off a handicap form to be filled out.

## 2013-05-16 NOTE — Telephone Encounter (Signed)
Filled and placed in [name]'s box. Temporary placard

## 2013-05-17 NOTE — Telephone Encounter (Signed)
Patient notified and form placed at front desk for pick up.

## 2013-06-05 ENCOUNTER — Encounter: Payer: Self-pay | Admitting: Cardiothoracic Surgery

## 2013-06-08 ENCOUNTER — Encounter: Payer: Self-pay | Admitting: Surgery

## 2013-06-10 ENCOUNTER — Other Ambulatory Visit: Payer: Self-pay

## 2013-06-14 ENCOUNTER — Encounter: Payer: Self-pay | Admitting: Family Medicine

## 2013-09-06 ENCOUNTER — Other Ambulatory Visit: Payer: Self-pay | Admitting: *Deleted

## 2013-09-06 ENCOUNTER — Ambulatory Visit (INDEPENDENT_AMBULATORY_CARE_PROVIDER_SITE_OTHER): Payer: BC Managed Care – PPO | Admitting: Family Medicine

## 2013-09-06 ENCOUNTER — Encounter: Payer: Self-pay | Admitting: Family Medicine

## 2013-09-06 VITALS — BP 124/86 | HR 100 | Temp 97.6°F | Wt 166.5 lb

## 2013-09-06 DIAGNOSIS — R21 Rash and other nonspecific skin eruption: Secondary | ICD-10-CM

## 2013-09-06 MED ORDER — SULFAMETHOXAZOLE-TMP DS 800-160 MG PO TABS: 1.0000 | ORAL_TABLET | Freq: Two times a day (BID) | ORAL | Status: DC

## 2013-09-06 MED ORDER — LORAZEPAM 0.5 MG PO TABS: 0.5000 mg | ORAL_TABLET | Freq: Two times a day (BID) | ORAL | Status: DC | PRN

## 2013-09-06 MED ORDER — CLOBETASOL PROPIONATE 0.05 % EX CREA: TOPICAL_CREAM | Freq: Two times a day (BID) | CUTANEOUS | Status: DC

## 2013-09-06 MED ORDER — PREGABALIN 100 MG PO CAPS: 100.0000 mg | ORAL_CAPSULE | Freq: Two times a day (BID) | ORAL | Status: DC

## 2013-09-06 NOTE — Assessment & Plan Note (Signed)
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 Will increase lyrica to $RemoveB'100mg'avHzoaKT$  twice daily for neuropathy component. Will treat possible infection with bactrim course. Will start vaseline ointment to skin lesions, and temovate to itchy skin surrounding. Recheck in 2 wks at prior scheduled appointment.

## 2013-09-06 NOTE — Progress Notes (Signed)
Pre-visit discussion using our clinic review tool. No additional management support is needed unless otherwise documented below in the visit note.  

## 2013-09-06 NOTE — Progress Notes (Signed)
   Subjective:    Patient ID: [name], female    DOB: March 17, 1971, 43 y.o.   MRN: [id number]  HPI CC: L foot pain  Worsening L foot pain recently.  Describes tingling and numbness, burning and knives.  Taking lyrica $RemoveBefor'75mg'dJDStsxbFwZe$  bid for neuropathy.  Paresthesias in legs as well.  Great toe nails have fallen off 1 month ago.  Noticing intermittent swelling of feet. Last saw wound center 04/2013.   Has seen Dr. Allyson Sabal for her skin scars - treated with hydroxyzine and steroid cream (05/2013).  This didn't help.  Was unable to afford returning to see him.  Checks sugars 5x/day, states running 100s, compliant with sliding scale insulin and lantus 35 QHS, apidra 5 u with meals with extra 2 units if sugars >150. Lab Results  Component Value Date   HGBA1C 11.4* 12/02/2012     Review of Systems Per HPI    Objective:   Physical Exam  Nursing note and vitals reviewed. Constitutional: She appears well-developed and well-nourished. No distress.  Musculoskeletal:  2+ DP bilaterally  Skin: Skin is warm and dry. Rash noted.  Left foot with lesions and scabs present, anterior shin lesion with mild surrounding erythema R foot with healed scars with some keloid formation.       Assessment & Plan:

## 2013-09-06 NOTE — Telephone Encounter (Signed)
Ok to refill? She forgot to ask for one at her appt.

## 2013-09-06 NOTE — Telephone Encounter (Signed)
plz phone in. 

## 2013-09-06 NOTE — Patient Instructions (Signed)
Let's increase lyrica to $RemoveB'100mg'eGsuKaLz$  twice daily. May use temovate cream on itchy lower legs But use vaseline ointment on lesions until they heal. Short course of bactrim to cover any possible infection of left anterior shin lesion. Return to see me at next scheduled appointment.

## 2013-09-07 ENCOUNTER — Other Ambulatory Visit: Payer: Self-pay | Admitting: Family Medicine

## 2013-09-07 ENCOUNTER — Encounter: Payer: Self-pay | Admitting: Radiology

## 2013-09-07 ENCOUNTER — Other Ambulatory Visit: Payer: Self-pay | Admitting: Internal Medicine

## 2013-09-07 DIAGNOSIS — Z8639 Personal history of other endocrine, nutritional and metabolic disease: Secondary | ICD-10-CM

## 2013-09-07 DIAGNOSIS — E559 Vitamin D deficiency, unspecified: Secondary | ICD-10-CM

## 2013-09-07 DIAGNOSIS — E1040 Type 1 diabetes mellitus with diabetic neuropathy, unspecified: Secondary | ICD-10-CM

## 2013-09-07 NOTE — Telephone Encounter (Signed)
Need f/u appt.

## 2013-09-07 NOTE — Telephone Encounter (Signed)
Rx called in as directed.   

## 2013-09-08 ENCOUNTER — Other Ambulatory Visit (INDEPENDENT_AMBULATORY_CARE_PROVIDER_SITE_OTHER): Payer: BC Managed Care – PPO

## 2013-09-08 ENCOUNTER — Encounter: Payer: Self-pay | Admitting: *Deleted

## 2013-09-08 DIAGNOSIS — E559 Vitamin D deficiency, unspecified: Secondary | ICD-10-CM

## 2013-09-08 DIAGNOSIS — Z862 Personal history of diseases of the blood and blood-forming organs and certain disorders involving the immune mechanism: Secondary | ICD-10-CM

## 2013-09-08 DIAGNOSIS — E1142 Type 2 diabetes mellitus with diabetic polyneuropathy: Secondary | ICD-10-CM

## 2013-09-08 DIAGNOSIS — Z8639 Personal history of other endocrine, nutritional and metabolic disease: Secondary | ICD-10-CM

## 2013-09-08 DIAGNOSIS — E1040 Type 1 diabetes mellitus with diabetic neuropathy, unspecified: Secondary | ICD-10-CM

## 2013-09-08 DIAGNOSIS — E1049 Type 1 diabetes mellitus with other diabetic neurological complication: Secondary | ICD-10-CM

## 2013-09-08 LAB — HEMOGLOBIN A1C: Hgb A1c MFr Bld: 9.8 % — ABNORMAL HIGH (ref 4.6–6.5)

## 2013-09-08 LAB — BASIC METABOLIC PANEL
BUN: 10 mg/dL (ref 6–23)
CO2: 29 mEq/L (ref 19–32)
Calcium: 9 mg/dL (ref 8.4–10.5)
Chloride: 100 mEq/L (ref 96–112)
Creatinine, Ser: 0.5 mg/dL (ref 0.4–1.2)
GFR: 158.37 mL/min (ref >60.00)
Glucose, Bld: 246 mg/dL — ABNORMAL HIGH (ref 70–99)
POTASSIUM: 4.3 meq/L (ref 3.5–5.1)
Sodium: 134 mEq/L — ABNORMAL LOW (ref 135–145)

## 2013-09-08 LAB — TSH: TSH: 0.97 u[IU]/mL (ref 0.35–5.50)

## 2013-09-08 LAB — LIPID PANEL
CHOLESTEROL: 178 mg/dL (ref 0–200)
HDL: 67.3 mg/dL (ref >39.00)
LDL CALC: 104 mg/dL — AB (ref 0–99)
Total CHOL/HDL Ratio: 3
Triglycerides: 34 mg/dL (ref 0.0–149.0)
VLDL: 6.8 mg/dL (ref 0.0–40.0)

## 2013-09-08 LAB — MICROALBUMIN / CREATININE URINE RATIO
Creatinine,U: 96.1 mg/dL
Microalb Creat Ratio: 0.8 mg/g (ref 0.0–30.0)
Microalb, Ur: 0.8 mg/dL (ref 0.0–1.9)

## 2013-09-08 NOTE — Telephone Encounter (Signed)
Pt said [facility] did not have lorazepam refill; advised sent to [facility] pt will have [facility] transfer rx.

## 2013-09-09 LAB — VITAMIN D 25 HYDROXY (VIT D DEFICIENCY, FRACTURES): Vit D, 25-Hydroxy: 17 ng/mL — ABNORMAL LOW (ref 30–89)

## 2013-09-14 ENCOUNTER — Other Ambulatory Visit: Payer: Self-pay | Admitting: *Deleted

## 2013-09-14 MED ORDER — INSULIN ASPART 100 UNIT/ML FLEXPEN: PEN_INJECTOR | SUBCUTANEOUS | Status: DC

## 2013-09-14 NOTE — Telephone Encounter (Signed)
Insurance will cover Novolog instead of Apidra. Changing per Dr Cruzita Lederer. Contacted pt.

## 2013-09-15 ENCOUNTER — Encounter: Payer: BC Managed Care – PPO | Admitting: Family Medicine

## 2013-09-16 ENCOUNTER — Telehealth: Payer: Self-pay | Admitting: *Deleted

## 2013-09-16 ENCOUNTER — Encounter: Payer: BC Managed Care – PPO | Admitting: Family Medicine

## 2013-09-16 NOTE — Telephone Encounter (Signed)
[facility] contacted our office about prior auth for Apidra. They said it would be free if a prior auth was done. I advised that we didn't prescribe this for her. Shannon-do you have a PA for this med for her? If so, could you please do it so she can get it free? Otherwise her Novolog will be $90. Thanks!

## 2013-09-16 NOTE — Telephone Encounter (Signed)
Spoke with Hildred Alamin at [facility] and advised Hildred Alamin of 09/08/13 phone note; [facility] will [facility] of [facility].

## 2013-09-20 ENCOUNTER — Encounter: Payer: Self-pay | Admitting: Family Medicine

## 2013-09-20 ENCOUNTER — Ambulatory Visit (INDEPENDENT_AMBULATORY_CARE_PROVIDER_SITE_OTHER): Payer: BC Managed Care – PPO | Admitting: Family Medicine

## 2013-09-20 VITALS — BP 132/90 | HR 88 | Temp 97.7°F | Ht 71.5 in | Wt 171.5 lb

## 2013-09-20 DIAGNOSIS — E559 Vitamin D deficiency, unspecified: Secondary | ICD-10-CM

## 2013-09-20 DIAGNOSIS — F39 Unspecified mood [affective] disorder: Secondary | ICD-10-CM

## 2013-09-20 DIAGNOSIS — R21 Rash and other nonspecific skin eruption: Secondary | ICD-10-CM

## 2013-09-20 DIAGNOSIS — E1040 Type 1 diabetes mellitus with diabetic neuropathy, unspecified: Secondary | ICD-10-CM

## 2013-09-20 DIAGNOSIS — Z309 Encounter for contraceptive management, unspecified: Secondary | ICD-10-CM

## 2013-09-20 DIAGNOSIS — E1049 Type 1 diabetes mellitus with other diabetic neurological complication: Secondary | ICD-10-CM

## 2013-09-20 DIAGNOSIS — Z23 Encounter for immunization: Secondary | ICD-10-CM

## 2013-09-20 DIAGNOSIS — Z01419 Encounter for gynecological examination (general) (routine) without abnormal findings: Secondary | ICD-10-CM

## 2013-09-20 DIAGNOSIS — Z1231 Encounter for screening mammogram for malignant neoplasm of breast: Secondary | ICD-10-CM

## 2013-09-20 DIAGNOSIS — Z0000 Encounter for general adult medical examination without abnormal findings: Secondary | ICD-10-CM

## 2013-09-20 DIAGNOSIS — E1142 Type 2 diabetes mellitus with diabetic polyneuropathy: Secondary | ICD-10-CM

## 2013-09-20 MED ORDER — NORETHIN ACE-ETH ESTRAD-FE 1-20 MG-MCG(24) PO TABS: 1.0000 | ORAL_TABLET | Freq: Every day | ORAL | Status: DC

## 2013-09-20 MED ORDER — VITAMIN D (ERGOCALCIFEROL) 1.25 MG (50000 UNIT) PO CAPS: 50000.0000 [IU] | ORAL_CAPSULE | ORAL | Status: DC

## 2013-09-20 NOTE — Assessment & Plan Note (Addendum)
Preventative protocols reviewed and updated unless pt declined. Discussed healthy diet and lifestyle. Unable to perform pap - difficult to find cervix.  Pt states prior well woman did not have pap performed 2/2 difficulty with same.  Will refer to OBGYN for well woman exam. Breast exam performed today.

## 2013-09-20 NOTE — Assessment & Plan Note (Signed)
Improving with treatment currently.  Continue bactrim, temovate, and vaseline.

## 2013-09-20 NOTE — Assessment & Plan Note (Signed)
Encouraged she f/u with Dr. Cruzita Lederer endo. Lab Results  Component Value Date   HGBA1C 9.8* 09/08/2013

## 2013-09-20 NOTE — Assessment & Plan Note (Signed)
Stable on ativan. Continue.

## 2013-09-20 NOTE — Assessment & Plan Note (Signed)
Will send in weekly vit D 50000 for 12 weeks.

## 2013-09-20 NOTE — Progress Notes (Signed)
BP 132/90  Pulse 88  Temp(Src) 97.7 F (36.5 C) (Oral)  Ht 5' 11.5" (1.816 m)  Wt 171 lb 8 oz (77.792 kg)  BMI 23.59 kg/m2  LMP 08/28/2013   CC: annual exam  Subjective:    Patient ID: [name], female    DOB: 1970-10-28, 43 y.o.   MRN: [id number]  HPI: [name] is a 43 y.o. female presenting on 09/20/2013 with Annual Exam  Has seen Dr. Allyson Sabal for her skin scars - treated with hydroxyzine and steroid cream (05/2013). This didn't help. Was unable to afford returning to see him.  Prurigo nodularis.  See prior note for details - skin lesions improving with treatment.  T1DM - followed by endo Dr. Cruzita Lederer, last seen 12/2012.    LMP - 08/28/2012. Regular periods. Noticing increasing cramping with periods. Has started midol for cramping. Would like to restart birth control - prior on ortho tri cyclin pill.  Husband doesn't want vasectomy.  States husband wants another child but patient does not. 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   Caffeine use: daily Occupation: cleans houses Regular exercise: yes, active 5x/wk Diet: good water, fruits/vegetables daily  Preventative:  Last CPE was 3+ yrs ago.  Well woman with PCP. No recent pap smear.  Has had abnormal pap smears in past. Mammogram - will schedule given fmhx Flu shot - today Tetanus 2006  Relevant past medical, surgical, family and social history reviewed and updated. Allergies and medications reviewed and updated. Current Outpatient Prescriptions on File Prior to Visit  Medication Sig  . aspirin EC 81 MG tablet Take 81 mg by mouth daily.  . Blood Glucose Monitoring Suppl (ONE TOUCH ULTRA SYSTEM KIT) W/DEVICE KIT 1 kit by Does not apply route once.  . Cholecalciferol (VITAMIN D) 2000 UNITS CAPS Take 1 capsule by mouth daily.  . clobetasol cream (TEMOVATE) 0.05 % Apply topically 2 (two) times daily. Apply to AA (not to open wounds)  . glucagon (GLUCAGON EMERGENCY) 1 MG injection Inject 1 mg  into the muscle once as needed.  Marland Kitchen glucose blood test strip Use as advised to check sugars 3-4x a day  . hydrOXYzine (ATARAX/VISTARIL) 25 MG tablet Take 1 tablet (25 mg total) by mouth 3 (three) times daily as needed for itching.  . insulin aspart (NOVOLOG FLEXPEN) 100 UNIT/ML FlexPen Inject 8 units into the skin at breakfast, 12 units at lunch and 7 units at dinner.  . insulin glargine (LANTUS) 100 units/mL SOLN Inject 35 Units into the skin every morning.  . Insulin Pen Needle (PEN NEEDLES 3/16") 31G X 5 MM MISC 1 each by Does not apply route 4 (four) times daily.  Marland Kitchen LORazepam (ATIVAN) 0.5 MG tablet Take 1 tablet (0.5 mg total) by mouth 2 (two) times daily as needed for anxiety.  . ONE TOUCH LANCETS MISC Use as advised to check sugars 3-4x a day  . pregabalin (LYRICA) 100 MG capsule Take 1 capsule (100 mg total) by mouth 2 (two) times daily.  Marland Kitchen sulfamethoxazole-trimethoprim (BACTRIM DS) 800-160 MG per tablet Take 1 tablet by mouth 2 (two) times daily.   No current facility-administered medications on file prior to visit.    Review of Systems  Constitutional: Negative for fever, chills, activity change, appetite change, fatigue and unexpected weight change.  HENT: Negative for hearing loss.   Eyes: Negative for visual disturbance.  Respiratory: Negative for cough, chest tightness, shortness of breath and wheezing.   Cardiovascular: Negative for chest pain, palpitations and leg swelling.  Gastrointestinal:  Negative for nausea, vomiting, abdominal pain, diarrhea, constipation, blood in stool and abdominal distention.  Genitourinary: Negative for hematuria and difficulty urinating.  Musculoskeletal: Negative for arthralgias, myalgias and neck pain.  Skin: Negative for rash.  Neurological: Positive for headaches. Negative for dizziness, seizures and syncope.  Hematological: Negative for adenopathy. Does not bruise/bleed easily.  Psychiatric/Behavioral: Negative for dysphoric mood. The patient  is nervous/anxious.    Per HPI unless specifically indicated above    Objective:    BP 132/90  Pulse 88  Temp(Src) 97.7 F (36.5 C) (Oral)  Ht 5' 11.5" (1.816 m)  Wt 171 lb 8 oz (77.792 kg)  BMI 23.59 kg/m2  LMP 08/28/2013  Physical Exam  Nursing note and vitals reviewed. Constitutional: She is oriented to person, place, and time. She appears well-developed and well-nourished. No distress.  HENT:  Head: Normocephalic and atraumatic.  Right Ear: Hearing, tympanic membrane, external ear and ear canal normal.  Left Ear: Hearing, tympanic membrane, external ear and ear canal normal.  Nose: Nose normal.  Mouth/Throat: Uvula is midline, oropharynx is clear and moist and mucous membranes are normal. No oropharyngeal exudate, posterior oropharyngeal edema or posterior oropharyngeal erythema.  Eyes: Conjunctivae and EOM are normal. Pupils are equal, round, and reactive to light. No scleral icterus.  Neck: Normal range of motion. Neck supple.  Cardiovascular: Normal rate, regular rhythm, normal heart sounds and intact distal pulses.   No murmur heard. Pulses:      Radial pulses are 2+ on the right side, and 2+ on the left side.  Pulmonary/Chest: Effort normal and breath sounds normal. No respiratory distress. She has no wheezes. She has no rales. Right breast exhibits no inverted nipple, no mass, no nipple discharge, no skin change and no tenderness. Left breast exhibits no inverted nipple, no mass, no nipple discharge, no skin change and no tenderness.  Abdominal: Soft. Bowel sounds are normal. She exhibits no distension and no mass. There is no tenderness. There is no rebound and no guarding.  Genitourinary: Vagina normal. Pelvic exam was performed with patient supine. There is no rash, tenderness, lesion or injury on the right labia. There is no rash, tenderness, lesion or injury on the left labia. Right adnexum displays no mass, no tenderness and no fullness. Left adnexum displays no mass,  no tenderness and no fullness.  Cervix, uterus not appreciated on bimanual today  Musculoskeletal: Normal range of motion. She exhibits no edema.  Lymphadenopathy:       Head (right side): No submandibular, no tonsillar, no preauricular and no posterior auricular adenopathy present.       Head (left side): No submandibular, no tonsillar, no preauricular and no posterior auricular adenopathy present.    She has no cervical adenopathy.    She has no axillary adenopathy.       Right axillary: No lateral adenopathy present.       Left axillary: No lateral adenopathy present.      Right: No supraclavicular adenopathy present.  Neurological: She is alert and oriented to person, place, and time.  CN grossly intact, station and gait intact  Skin: Skin is warm and dry. No rash noted.  Psychiatric: She has a normal mood and affect. Her behavior is normal. Judgment and thought content normal.   Results for orders placed in visit on 09/08/13  VITAMIN D 25 HYDROXY      Result Value Ref Range   Vit D, 25-Hydroxy 17 (*) 30 - 89 ng/mL  BASIC METABOLIC PANEL  Result Value Ref Range   Sodium 134 (*) 135 - 145 mEq/L   Potassium 4.3  3.5 - 5.1 mEq/L   Chloride 100  96 - 112 mEq/L   CO2 29  19 - 32 mEq/L   Glucose, Bld 246 (*) 70 - 99 mg/dL   BUN 10  6 - 23 mg/dL   Creatinine, Ser 0.5  0.4 - 1.2 mg/dL   Calcium 9.0  8.4 - 10.5 mg/dL   GFR 158.37  >60.00 mL/min  LIPID PANEL      Result Value Ref Range   Cholesterol 178  0 - 200 mg/dL   Triglycerides 34.0  0.0 - 149.0 mg/dL   HDL 67.30  >39.00 mg/dL   VLDL 6.8  0.0 - 40.0 mg/dL   LDL Cholesterol 104 (*) 0 - 99 mg/dL   Total CHOL/HDL Ratio 3    HEMOGLOBIN A1C      Result Value Ref Range   Hemoglobin A1C 9.8 (*) 4.6 - 6.5 %  TSH      Result Value Ref Range   TSH 0.97  0.35 - 5.50 uIU/mL  MICROALBUMIN / CREATININE URINE RATIO      Result Value Ref Range   Microalb, Ur 0.8  0.0 - 1.9 mg/dL   Creatinine,U 96.1     Microalb Creat Ratio 0.8   0.0 - 30.0 mg/g      Assessment & Plan:   Problem List Items Addressed This Visit   Contraception management     Reviewed different options.  Discussed cardiovascular risks of hormonal contraceptives as well as blood clots and speeding up growth of hormone sensitive cancer. Pt not interested in IUD or implanon.  Interested in daily OCP.  Will start low dose estrogen pill.  Start Sunday after next period.  Protection in interim.    Health maintenance examination - Primary     Preventative protocols reviewed and updated unless pt declined. Discussed healthy diet and lifestyle. Unable to perform pap - difficult to find cervix.  Pt states prior well woman did not have pap performed 2/2 difficulty with same.  Will refer to OBGYN for well woman exam. Breast exam performed today.    Mood disorder     Stable on ativan. Continue.    Skin rash     Improving with treatment currently.  Continue bactrim, temovate, and vaseline.    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 Cruzita Lederer endo. Lab Results  Component Value Date   HGBA1C 9.8* 09/08/2013      Relevant Medications      insulin aspart (NOVOLOG FLEXPEN) 100 UNIT/ML FlexPen   Unspecified vitamin D deficiency     Will send in weekly vit D 50000 for 12 weeks.     Other Visit Diagnoses   Other screening mammogram        Relevant Orders       MM DIGITAL SCREENING BILATERAL    Routine gynecological examination        Relevant Orders       Ambulatory referral to Gynecology    Immunization due        Relevant Orders       Tdap vaccine greater than or equal to 7yo IM    Need for prophylactic vaccination and inoculation against influenza        Relevant Orders       Flu Vaccine QUAD 36+ mos PF IM (Fluarix)  Follow up plan: Return in about 6 months (around 03/20/2014), or as needed, for follow up visit.

## 2013-09-20 NOTE — Progress Notes (Signed)
Pre-visit discussion using our clinic review tool. No additional management support is needed unless otherwise documented below in the visit note.  

## 2013-09-20 NOTE — Assessment & Plan Note (Addendum)
Reviewed different options.  Discussed cardiovascular risks of hormonal contraceptives as well as blood clots and speeding up growth of hormone sensitive cancer. Pt not interested in IUD or implanon.  Interested in daily OCP.  Will start low dose estrogen pill.  Start Sunday after next period.  Protection in interim.

## 2013-09-20 NOTE — Patient Instructions (Addendum)
Flu shot today, tetanus and pertussis shot today (Tdap) Vitamin D 50,000 units weekly sent in. Pass by [facility] to schedule mammogram and GYN exam. Schedule appointment with Dr. Cruzita Lederer for follow up diabetes Start loestrin on Sunday after next period. Good to see you today, call us with questions. Return in 6 months for follow up visit, sooner if needed.

## 2013-09-24 ENCOUNTER — Telehealth: Payer: Self-pay

## 2013-09-24 NOTE — Telephone Encounter (Signed)
Relevant patient education assigned to patient using Emmi. ° °

## 2013-09-27 ENCOUNTER — Encounter: Payer: Self-pay | Admitting: Family Medicine

## 2013-10-01 ENCOUNTER — Other Ambulatory Visit: Payer: Self-pay | Admitting: Family Medicine

## 2013-10-04 MED ORDER — NORGESTIM-ETH ESTRAD TRIPHASIC 0.18/0.215/0.25 MG-25 MCG PO TABS: 1.0000 | ORAL_TABLET | Freq: Every day | ORAL | Status: DC

## 2013-10-04 NOTE — Addendum Note (Signed)
Addended by: Ria Bush on: 10/04/2013 11:03 PM   Modules accepted: Orders, Medications

## 2013-10-04 NOTE — Telephone Encounter (Signed)
New med sent in plz call pharmacy to price out - or to get low cost alternatives.

## 2013-10-04 NOTE — Telephone Encounter (Addendum)
[facility] left v/m; [facility] did receive order for loestrin 24 FE which is no longer available so [facility] substituted minastrin 24 FE but copay is > $100.00. Request different med sent to [facility] pharmacy.Please advise.

## 2013-10-05 ENCOUNTER — Other Ambulatory Visit: Payer: Self-pay | Admitting: Family Medicine

## 2013-10-05 MED ORDER — NORETHINDRONE ACET-ETHINYL EST 1-20 MG-MCG PO TABS: 1.0000 | ORAL_TABLET | Freq: Every day | ORAL | Status: DC

## 2013-10-05 NOTE — Telephone Encounter (Signed)
Sent in new med.

## 2013-10-18 ENCOUNTER — Encounter: Payer: Self-pay | Admitting: Internal Medicine

## 2013-10-18 ENCOUNTER — Ambulatory Visit (INDEPENDENT_AMBULATORY_CARE_PROVIDER_SITE_OTHER): Payer: Medicaid Other | Admitting: Internal Medicine

## 2013-10-18 VITALS — BP 118/78 | HR 98 | Temp 97.9°F | Wt 169.5 lb

## 2013-10-18 DIAGNOSIS — R0982 Postnasal drip: Secondary | ICD-10-CM

## 2013-10-18 DIAGNOSIS — J029 Acute pharyngitis, unspecified: Secondary | ICD-10-CM

## 2013-10-18 NOTE — Patient Instructions (Addendum)
Sore Throat A sore throat is pain, burning, irritation, or scratchiness of the throat. There is often pain or tenderness when swallowing or talking. A sore throat may be accompanied by other symptoms, such as coughing, sneezing, fever, and swollen neck glands. A sore throat is often the first sign of another sickness, such as a cold, flu, strep throat, or mononucleosis (commonly known as mono). Most sore throats go away without medical treatment. CAUSES  The most common causes of a sore throat include:  A viral infection, such as a cold, flu, or mono.  A bacterial infection, such as strep throat, tonsillitis, or whooping cough.  Seasonal allergies.  Dryness in the air.  Irritants, such as smoke or pollution.  Gastroesophageal reflux disease (GERD). HOME CARE INSTRUCTIONS   Only take over-the-counter medicines as directed by your caregiver.  Drink enough fluids to keep your urine clear or pale yellow.  Rest as needed.  Try using throat sprays, lozenges, or sucking on hard candy to ease any pain (if older than 4 years or as directed).  Sip warm liquids, such as broth, herbal tea, or warm water with honey to relieve pain temporarily. You may also eat or drink cold or frozen liquids such as frozen ice pops.  Gargle with salt water (mix 1 tsp salt with 8 oz of water).  Do not smoke and avoid secondhand smoke.  Put a cool-mist humidifier in your bedroom at night to moisten the air. You can also turn on a hot shower and sit in the bathroom with the door closed for 5 10 minutes. SEEK IMMEDIATE MEDICAL CARE IF:  You have difficulty breathing.  You are unable to swallow fluids, soft foods, or your saliva.  You have increased swelling in the throat.  Your sore throat does not get better in 7 days.  You have nausea and vomiting.  You have a fever or persistent symptoms for more than 2 3 days.  You have a fever and your symptoms suddenly get worse. MAKE SURE YOU:   Understand  these instructions.  Will watch your condition.  Will get help right away if you are not doing well or get worse. Document Released: 08/29/2004 Document Revised: 07/08/2012 Document Reviewed: 03/29/2012 [employer] Patient Information 2014 [employer].  

## 2013-10-18 NOTE — Progress Notes (Signed)
HPI  Pt presents to the clinic today with c/o sore throat and ear pain. This started 9 days ago. She is having difficulty swallowing secondary to post nasal drainage. She feels like her ears are on fire. She does c/o sinus pressure. She has tried OTC allergy ear drops. She has no history of allergies or asthma.  Review of Systems    Past Medical History  Diagnosis Date  . Type 1 diabetes, uncontrolled, with neuropathy   . Mood disorder     anxiety  . Arthritis   . Migraines   . History of chicken pox   . Prurigo nodularis     with diabetic dermopathy    Family History  Problem Relation Age of Onset  . Diabetes Mother     type 2  . Hypertension Mother   . Thyroid disease Mother   . Cancer Maternal Grandmother     Breast, stomach  . Cancer Paternal Grandmother     stomach, lung (smoker)  . Diabetes Paternal Grandmother   . Diabetes Paternal Grandfather   . Bipolar disorder Mother   . Cancer Mother 76    breast  . CAD Sister     stent  . Stroke Maternal Aunt   . Cancer Maternal Uncle     prostate  . CAD Maternal Aunt     stents  . Cancer Maternal Aunt 64    ovarian    History   Social History  . Marital Status: Married    Spouse Name: N/A    Number of Children: N/A  . Years of Education: N/A   Occupational History  . Not on file.   Social History Main Topics  . Smoking status: Never Smoker   . Smokeless tobacco: Never Used  . Alcohol Use: Yes     Comment: Occasional wine  . Drug Use: No  . Sexual Activity: Yes    Partners: Male   Other Topics Concern  . Not on file   Social History Narrative   Caffeine use: daily   Occupation: cleans houses   Regular exercise: yes, active 5x/wk   Diet: good water, fruits/vegetables daily    Allergies  Allergen Reactions  . Gabapentin Other (See Comments)    Dizziness     Constitutional:  Denies headache, fatigue, fever or abrupt weight changes.  HEENT:  Positive ear pain, sore throat. Denies eye redness,  ringing in the ears, wax buildup, runny nose or bloody nose. Respiratory:  Denies cough, difficulty breathing or shortness of breath.  Cardiovascular: Denies chest pain, chest tightness, palpitations or swelling in the hands or feet.   No other specific complaints in a complete review of systems (except as listed in HPI above).  Objective:    General: Appears her stated age, well developed, well nourished in NAD. HEENT: Head: normal shape and size, sinus tenderness noted; Eyes: sclera white, no icterus, conjunctiva pink, PERRLA and EOMs intact; Ears: Tm's pink and intact, normal light reflex, + effusion, R> L; Nose: mucosa pink and moist, septum midline; Throat/Mouth: + PND. Teeth present, mucosa erythematous and moist, no exudate noted, no lesions or ulcerations noted.  Neck: Neck supple, trachea midline. No massses, lumps or thyromegaly present.  Cardiovascular: Normal rate and rhythm. S1,S2 noted.  No murmur, rubs or gallops noted. No JVD or BLE edema. No carotid bruits noted. Pulmonary/Chest: Normal effort and positive vesicular breath sounds. No respiratory distress. No wheezes, rales or ronchi noted.      Assessment & Plan:  Pharyngitis secondary to PND  Use a zyrtec OTC daily Ibuprofen for pain/inflammation Gargle with salt water for sore throat 80 mg Depo IM today (she will need to cover with extra insulin if sugars increase)  RTC as needed or if symptoms persist.

## 2013-10-18 NOTE — Progress Notes (Signed)
Pre visit review using our clinic review tool, if applicable. No additional management support is needed unless otherwise documented below in the visit note. 

## 2013-10-19 MED ORDER — METHYLPREDNISOLONE ACETATE 40 MG/ML IJ SUSP
80.0000 mg | Freq: Once | INTRAMUSCULAR | Status: AC
  Administered 2013-10-19: 80 mg via INTRAMUSCULAR

## 2013-10-19 NOTE — Addendum Note (Signed)
Addended by: Lurlean Nanny on: 10/19/2013 09:20 AM   Modules accepted: Orders

## 2013-10-20 ENCOUNTER — Ambulatory Visit: Payer: BC Managed Care – PPO

## 2013-10-29 ENCOUNTER — Ambulatory Visit (INDEPENDENT_AMBULATORY_CARE_PROVIDER_SITE_OTHER): Payer: Medicaid Other | Admitting: Family Medicine

## 2013-10-29 ENCOUNTER — Encounter: Payer: Self-pay | Admitting: Family Medicine

## 2013-10-29 ENCOUNTER — Other Ambulatory Visit (HOSPITAL_COMMUNITY)
Admission: RE | Admit: 2013-10-29 | Discharge: 2013-10-29 | Disposition: A | Discharge status: Home / Self Care | Payer: Medicaid Other | Source: Ambulatory Visit | Attending: Family Medicine | Admitting: Family Medicine

## 2013-10-29 VITALS — BP 130/85 | HR 92 | Ht 72.0 in | Wt 172.0 lb

## 2013-10-29 DIAGNOSIS — Z1151 Encounter for screening for human papillomavirus (HPV): Secondary | ICD-10-CM | POA: Insufficient documentation

## 2013-10-29 DIAGNOSIS — Z01419 Encounter for gynecological examination (general) (routine) without abnormal findings: Secondary | ICD-10-CM

## 2013-10-29 DIAGNOSIS — Z124 Encounter for screening for malignant neoplasm of cervix: Secondary | ICD-10-CM

## 2013-10-29 NOTE — Patient Instructions (Signed)
Preventive Care for Adults, Female A healthy lifestyle and preventive care can promote health and wellness. Preventive health guidelines for women include the following key practices.  A routine yearly physical is a good way to check with your health care provider about your health and preventive screening. It is a chance to share any concerns and updates on your health and to receive a thorough exam.  Visit your dentist for a routine exam and preventive care every 6 months. Brush your teeth twice a day and floss once a day. Good oral hygiene prevents tooth decay and gum disease.  The frequency of eye exams is based on your age, health, family medical history, use of contact lenses, and other factors. Follow your health care provider's recommendations for frequency of eye exams.  Eat a healthy diet. Foods like vegetables, fruits, whole grains, low-fat dairy products, and lean protein foods contain the nutrients you need without too many calories. Decrease your intake of foods high in solid fats, added sugars, and salt. Eat the right amount of calories for you.Get information about a proper diet from your health care provider, if necessary.  Regular physical exercise is one of the most important things you can do for your health. Most adults should get at least 150 minutes of moderate-intensity exercise (any activity that increases your heart rate and causes you to sweat) each week. In addition, most adults need muscle-strengthening exercises on 2 or more days a week.  Maintain a healthy weight. The body mass index (BMI) is a screening tool to identify possible weight problems. It provides an estimate of body fat based on height and weight. Your health care provider can find your BMI, and can help you achieve or maintain a healthy weight.For adults 20 years and older:  A BMI below 18.5 is considered underweight.  A BMI of 18.5 to 24.9 is normal.  A BMI of 25 to 29.9 is considered overweight.  A  BMI of 30 and above is considered obese.  Maintain normal blood lipids and cholesterol levels by exercising and minimizing your intake of saturated fat. Eat a balanced diet with plenty of fruit and vegetables. Blood tests for lipids and cholesterol should begin at age 71 and be repeated every 5 years. If your lipid or cholesterol levels are high, you are over 50, or you are at high risk for heart disease, you may need your cholesterol levels checked more frequently.Ongoing high lipid and cholesterol levels should be treated with medicines if diet and exercise are not working.  If you smoke, find out from your health care provider how to quit. If you do not use tobacco, do not start.  Lung cancer screening is recommended for adults aged 85 80 years who are at high risk for developing lung cancer because of a history of smoking. A yearly low-dose CT scan of the lungs is recommended for people who have at least a 30-pack-year history of smoking and are a current smoker or have quit within the past 15 years. A pack year of smoking is smoking an average of 1 pack of cigarettes a day for 1 year (for example: 1 pack a day for 30 years or 2 packs a day for 15 years). Yearly screening should continue until the smoker has stopped smoking for at least 15 years. Yearly screening should be stopped for people who develop a health problem that would prevent them from having lung cancer treatment.  If you are pregnant, do not drink alcohol. If you  are breastfeeding, be very cautious about drinking alcohol. If you are not pregnant and choose to drink alcohol, do not have more than 1 drink per day. One drink is considered to be 12 ounces (355 mL) of beer, 5 ounces (148 mL) of wine, or 1.5 ounces (44 mL) of liquor.  Avoid use of street drugs. Do not share needles with anyone. Ask for help if you need support or instructions about stopping the use of drugs.  High blood pressure causes heart disease and increases the risk  of stroke. Your blood pressure should be checked at least every 1 to 2 years. Ongoing high blood pressure should be treated with medicines if weight loss and exercise do not work.  If you are 39 44 years old, ask your health care provider if you should take aspirin to prevent strokes.  Diabetes screening involves taking a blood sample to check your fasting blood sugar level. This should be done once every 3 years, after age 56, if you are within normal weight and without risk factors for diabetes. Testing should be considered at a younger age or be carried out more frequently if you are overweight and have at least 1 risk factor for diabetes.  Breast cancer screening is essential preventive care for women. You should practice "breast self-awareness." This means understanding the normal appearance and feel of your breasts and may include breast self-examination. Any changes detected, no matter how small, should be reported to a health care provider. Women in their 40s and 30s should have a clinical breast exam (CBE) by a health care provider as part of a regular health exam every 1 to 3 years. After age 28, women should have a CBE every year. Starting at age 72, women should consider having a mammogram (breast X-ray test) every year. Women who have a family history of breast cancer should talk to their health care provider about genetic screening. Women at a high risk of breast cancer should talk to their health care providers about having an MRI and a mammogram every year.  Breast cancer gene (BRCA)-related cancer risk assessment is recommended for women who have family members with BRCA-related cancers. BRCA-related cancers include breast, ovarian, tubal, and peritoneal cancers. Having family members with these cancers may be associated with an increased risk for harmful changes (mutations) in the breast cancer genes BRCA1 and BRCA2. Results of the assessment will determine the need for genetic counseling  and BRCA1 and BRCA2 testing.  The Pap test is a screening test for cervical cancer. A Pap test can show cell changes on the cervix that might become cervical cancer if left untreated. A Pap test is a procedure in which cells are obtained and examined from the lower end of the uterus (cervix).  Women should have a Pap test starting at age 59.  Between ages 42 and 13, Pap tests should be repeated every 2 years.  Beginning at age 53, you should have a Pap test every 3 years as long as the past 3 Pap tests have been normal.  Some women have medical problems that increase the chance of getting cervical cancer. Talk to your health care provider about these problems. It is especially important to talk to your health care provider if a new problem develops soon after your last Pap test. In these cases, your health care provider may recommend more frequent screening and Pap tests.  The above recommendations are the same for women who have or have not gotten the vaccine  for human papillomavirus (HPV).  If you had a hysterectomy for a problem that was not cancer or a condition that could lead to cancer, then you no longer need Pap tests. Even if you no longer need a Pap test, a regular exam is a good idea to make sure no other problems are starting.  If you are between ages 77 and 72 years, and you have had normal Pap tests going back 10 years, you no longer need Pap tests. Even if you no longer need a Pap test, a regular exam is a good idea to make sure no other problems are starting.  If you have had past treatment for cervical cancer or a condition that could lead to cancer, you need Pap tests and screening for cancer for at least 20 years after your treatment.  If Pap tests have been discontinued, risk factors (such as a new sexual partner) need to be reassessed to determine if screening should be resumed.  The HPV test is an additional test that may be used for cervical cancer screening. The HPV test  looks for the virus that can cause the cell changes on the cervix. The cells collected during the Pap test can be tested for HPV. The HPV test could be used to screen women aged 18 years and older, and should be used in women of any age who have unclear Pap test results. After the age of 57, women should have HPV testing at the same frequency as a Pap test.  Colorectal cancer can be detected and often prevented. Most routine colorectal cancer screening begins at the age of 91 years and continues through age 28 years. However, your health care provider may recommend screening at an earlier age if you have risk factors for colon cancer. On a yearly basis, your health care provider may provide home test kits to check for hidden blood in the stool. Use of a small camera at the end of a tube, to directly examine the colon (sigmoidoscopy or colonoscopy), can detect the earliest forms of colorectal cancer. Talk to your health care provider about this at age 8, when routine screening begins. Direct exam of the colon should be repeated every 5 10 years through age 16 years, unless early forms of pre-cancerous polyps or small growths are found.  People who are at an increased risk for hepatitis B should be screened for this virus. You are considered at high risk for hepatitis B if:  You were born in a country where hepatitis B occurs often. Talk with your health care provider about which countries are considered high risk.  Your parents were born in a high-risk country and you have not received a shot to protect against hepatitis B (hepatitis B vaccine).  You have HIV or AIDS.  You use needles to inject street drugs.  You live with, or have sex with, someone who has Hepatitis B.  You get hemodialysis treatment.  You take certain medicines for conditions like cancer, organ transplantation, and autoimmune conditions.  Hepatitis C blood testing is recommended for all people born from [birth year] through 1965 and  any individual with known risks for hepatitis C.  Practice safe sex. Use condoms and avoid high-risk sexual practices to reduce the spread of sexually transmitted infections (STIs). STIs include gonorrhea, chlamydia, syphilis, trichomonas, herpes, HPV, and human immunodeficiency virus (HIV). Herpes, HIV, and HPV are viral illnesses that have no cure. They can result in disability, cancer, and death. Sexually active women aged 60  years and younger should be checked for chlamydia. Older women with new or multiple partners should also be tested for chlamydia. Testing for other STIs is recommended if you are sexually active and at increased risk.  Osteoporosis is a disease in which the bones lose minerals and strength with aging. This can result in serious bone fractures or breaks. The risk of osteoporosis can be identified using a bone density scan. Women ages 18 years and over and women at risk for fractures or osteoporosis should discuss screening with their health care providers. Ask your health care provider whether you should take a calcium supplement or vitamin D to reduce the rate of osteoporosis.  Menopause can be associated with physical symptoms and risks. Hormone replacement therapy is available to decrease symptoms and risks. You should talk to your health care provider about whether hormone replacement therapy is right for you.  Use sunscreen. Apply sunscreen liberally and repeatedly throughout the day. You should seek shade when your shadow is shorter than you. Protect yourself by wearing long sleeves, pants, a wide-brimmed hat, and sunglasses year round, whenever you are outdoors.  Once a month, do a whole body skin exam, using a mirror to look at the skin on your back. Tell your health care provider of new moles, moles that have irregular borders, moles that are larger than a pencil eraser, or moles that have changed in shape or color.  Stay current with required vaccines  (immunizations).  Influenza vaccine. All adults should be immunized every year.  Tetanus, diphtheria, and acellular pertussis (Td, Tdap) vaccine. Pregnant women should receive 1 dose of Tdap vaccine during each pregnancy. The dose should be obtained regardless of the length of time since the last dose. Immunization is preferred during the 27th 36th week of gestation. An adult who has not previously received Tdap or who does not know her vaccine status should receive 1 dose of Tdap. This initial dose should be followed by tetanus and diphtheria toxoids (Td) booster doses every 10 years. Adults with an unknown or incomplete history of completing a 3-dose immunization series with Td-containing vaccines should begin or complete a primary immunization series including a Tdap dose. Adults should receive a Td booster every 10 years.  Varicella vaccine. An adult without evidence of immunity to varicella should receive 2 doses or a second dose if she has previously received 1 dose. Pregnant females who do not have evidence of immunity should receive the first dose after pregnancy. This first dose should be obtained before leaving the health care facility. The second dose should be obtained 4 8 weeks after the first dose.  Human papillomavirus (HPV) vaccine. Females aged 9 26 years who have not received the vaccine previously should obtain the 3-dose series. The vaccine is not recommended for use in pregnant females. However, pregnancy testing is not needed before receiving a dose. If a female is found to be pregnant after receiving a dose, no treatment is needed. In that case, the remaining doses should be delayed until after the pregnancy. Immunization is recommended for any person with an immunocompromised condition through the age of 51 years if she did not get any or all doses earlier. During the 3-dose series, the second dose should be obtained 4 8 weeks after the first dose. The third dose should be obtained  24 weeks after the first dose and 16 weeks after the second dose.  Zoster vaccine. One dose is recommended for adults aged 57 years or older unless certain  conditions are present.  Measles, mumps, and rubella (MMR) vaccine. Adults born before [birth year] generally are considered immune to measles and mumps. Adults born in [birth year] or later should have 1 or more doses of MMR vaccine unless there is a contraindication to the vaccine or there is laboratory evidence of immunity to each of the three diseases. A routine second dose of MMR vaccine should be obtained at least 28 days after the first dose for students attending postsecondary schools, health care workers, or international travelers. People who received inactivated measles vaccine or an unknown type of measles vaccine during 1963 1967 should receive 2 doses of MMR vaccine. People who received inactivated mumps vaccine or an unknown type of mumps vaccine before 1979 and are at high risk for mumps infection should consider immunization with 2 doses of MMR vaccine. For females of childbearing age, rubella immunity should be determined. If there is no evidence of immunity, females who are not pregnant should be vaccinated. If there is no evidence of immunity, females who are pregnant should delay immunization until after pregnancy. Unvaccinated health care workers born before [birth year] who lack laboratory evidence of measles, mumps, or rubella immunity or laboratory confirmation of disease should consider measles and mumps immunization with 2 doses of MMR vaccine or rubella immunization with 1 dose of MMR vaccine.  Pneumococcal 13-valent conjugate (PCV13) vaccine. When indicated, a person who is uncertain of her immunization history and has no record of immunization should receive the PCV13 vaccine. An adult aged 42 years or older who has certain medical conditions and has not been previously immunized should receive 1 dose of PCV13 vaccine. This PCV13 should be followed  with a dose of pneumococcal polysaccharide (PPSV23) vaccine. The PPSV23 vaccine dose should be obtained at least 8 weeks after the dose of PCV13 vaccine. An adult aged 4 years or older who has certain medical conditions and previously received 1 or more doses of PPSV23 vaccine should receive 1 dose of PCV13. The PCV13 vaccine dose should be obtained 1 or more years after the last PPSV23 vaccine dose.  Pneumococcal polysaccharide (PPSV23) vaccine. When PCV13 is also indicated, PCV13 should be obtained first. All adults aged 27 years and older should be immunized. An adult younger than age 33 years who has certain medical conditions should be immunized. Any person who resides in a nursing home or long-term care facility should be immunized. An adult smoker should be immunized. People with an immunocompromised condition and certain other conditions should receive both PCV13 and PPSV23 vaccines. People with human immunodeficiency virus (HIV) infection should be immunized as soon as possible after diagnosis. Immunization during chemotherapy or radiation therapy should be avoided. Routine use of PPSV23 vaccine is not recommended for American Indians, [place] Natives, or people younger than 65 years unless there are medical conditions that require PPSV23 vaccine. When indicated, people who have unknown immunization and have no record of immunization should receive PPSV23 vaccine. One-time revaccination 5 years after the first dose of PPSV23 is recommended for people aged 13 64 years who have chronic kidney failure, nephrotic syndrome, asplenia, or immunocompromised conditions. People who received 1 2 doses of PPSV23 before age 66 years should receive another dose of PPSV23 vaccine at age 27 years or later if at least 5 years have passed since the previous dose. Doses of PPSV23 are not needed for people immunized with PPSV23 at or after age 33 years.  Meningococcal vaccine. Adults with asplenia or persistent complement  component deficiencies should receive 2  doses of quadrivalent meningococcal conjugate (MenACWY-D) vaccine. The doses should be obtained at least 2 months apart. Microbiologists working with certain meningococcal bacteria, [place] recruits, people at risk during an outbreak, and people who travel to or live in countries with a high rate of meningitis should be immunized. A first-year college student up through age 49 years who is living in a residence hall should receive a dose if she did not receive a dose on or after her 16th birthday. Adults who have certain high-risk conditions should receive one or more doses of vaccine.  Hepatitis A vaccine. Adults who wish to be protected from this disease, have certain high-risk conditions, work with hepatitis A-infected animals, work in hepatitis A research labs, or travel to or work in countries with a high rate of hepatitis A should be immunized. Adults who were previously unvaccinated and who anticipate close contact with an international adoptee during the first 60 days after arrival in the [place] from a country with a high rate of hepatitis A should be immunized.  Hepatitis B vaccine. Adults who wish to be protected from this disease, have certain high-risk conditions, may be exposed to blood or other infectious body fluids, are household contacts or sex partners of hepatitis B positive people, are clients or workers in certain care facilities, or travel to or work in countries with a high rate of hepatitis B should be immunized.  Haemophilus influenzae type b (Hib) vaccine. A previously unvaccinated person with asplenia or sickle cell disease or having a scheduled splenectomy should receive 1 dose of Hib vaccine. Regardless of previous immunization, a recipient of a hematopoietic stem cell transplant should receive a 3-dose series 6 12 months after her successful transplant. Hib vaccine is not recommended for adults with HIV infection. Preventive  Services / Frequency Ages 24 to 39years  Blood pressure check.** / Every 1 to 2 years.  Lipid and cholesterol check.** / Every 5 years beginning at age 66.  Clinical breast exam.** / Every 3 years for women in their 12s and 24s.  BRCA-related cancer risk assessment.** / For women who have family members with a BRCA-related cancer (breast, ovarian, tubal, or peritoneal cancers).  Pap test.** / Every 2 years from ages 31 through 69. Every 3 years starting at age 64 through age 76 or 89 with a history of 3 consecutive normal Pap tests.  HPV screening.** / Every 3 years from ages 10 through ages 10 to 96 with a history of 3 consecutive normal Pap tests.  Hepatitis C blood test.** / For any individual with known risks for hepatitis C.  Skin self-exam. / Monthly.  Influenza vaccine. / Every year.  Tetanus, diphtheria, and acellular pertussis (Tdap, Td) vaccine.** / Consult your health care provider. Pregnant women should receive 1 dose of Tdap vaccine during each pregnancy. 1 dose of Td every 10 years.  Varicella vaccine.** / Consult your health care provider. Pregnant females who do not have evidence of immunity should receive the first dose after pregnancy.  HPV vaccine. / 3 doses over 6 months, if 90 and younger. The vaccine is not recommended for use in pregnant females. However, pregnancy testing is not needed before receiving a dose.  Measles, mumps, rubella (MMR) vaccine.** / You need at least 1 dose of MMR if you were born in 1957 or later. You may also need a 2nd dose. For females of childbearing age, rubella immunity should be determined. If there is no evidence of immunity, females who are not  pregnant should be vaccinated. If there is no evidence of immunity, females who are pregnant should delay immunization until after pregnancy.  Pneumococcal 13-valent conjugate (PCV13) vaccine.** / Consult your health care provider.  Pneumococcal polysaccharide (PPSV23) vaccine.** / 1 to 2  doses if you smoke cigarettes or if you have certain conditions.  Meningococcal vaccine.** / 1 dose if you are age 88 to 6 years and a Market researcher living in a residence hall, or have one of several medical conditions, you need to get vaccinated against meningococcal disease. You may also need additional booster doses.  Hepatitis A vaccine.** / Consult your health care provider.  Hepatitis B vaccine.** / Consult your health care provider.  Haemophilus influenzae type b (Hib) vaccine.** / Consult your health care provider. Ages 23 to 64years  Blood pressure check.** / Every 1 to 2 years.  Lipid and cholesterol check.** / Every 5 years beginning at age 20 years.  Lung cancer screening. / Every year if you are aged 51 80 years and have a 30-pack-year history of smoking and currently smoke or have quit within the past 15 years. Yearly screening is stopped once you have quit smoking for at least 15 years or develop a health problem that would prevent you from having lung cancer treatment.  Clinical breast exam.** / Every year after age 8 years.  BRCA-related cancer risk assessment.** / For women who have family members with a BRCA-related cancer (breast, ovarian, tubal, or peritoneal cancers).  Mammogram.** / Every year beginning at age 10 years and continuing for as long as you are in good health. Consult with your health care provider.  Pap test.** / Every 3 years starting at age 30 years through age 5 or 61 years with a history of 3 consecutive normal Pap tests.  HPV screening.** / Every 3 years from ages 39 years through ages 72 to 19 years with a history of 3 consecutive normal Pap tests.  Fecal occult blood test (FOBT) of stool. / Every year beginning at age 59 years and continuing until age 27 years. You may not need to do this test if you get a colonoscopy every 10 years.  Flexible sigmoidoscopy or colonoscopy.** / Every 5 years for a flexible sigmoidoscopy or every  10 years for a colonoscopy beginning at age 110 years and continuing until age 63 years.  Hepatitis C blood test.** / For all people born from [birth year] through 1966 and any individual with known risks for hepatitis C.  Skin self-exam. / Monthly.  Influenza vaccine. / Every year.  Tetanus, diphtheria, and acellular pertussis (Tdap/Td) vaccine.** / Consult your health care provider. Pregnant women should receive 1 dose of Tdap vaccine during each pregnancy. 1 dose of Td every 10 years.  Varicella vaccine.** / Consult your health care provider. Pregnant females who do not have evidence of immunity should receive the first dose after pregnancy.  Zoster vaccine.** / 1 dose for adults aged 46 years or older.  Measles, mumps, rubella (MMR) vaccine.** / You need at least 1 dose of MMR if you were born in 1957 or later. You may also need a 2nd dose. For females of childbearing age, rubella immunity should be determined. If there is no evidence of immunity, females who are not pregnant should be vaccinated. If there is no evidence of immunity, females who are pregnant should delay immunization until after pregnancy.  Pneumococcal 13-valent conjugate (PCV13) vaccine.** / Consult your health care provider.  Pneumococcal polysaccharide (PPSV23) vaccine.** / 1  to 2 doses if you smoke cigarettes or if you have certain conditions.  Meningococcal vaccine.** / Consult your health care provider.  Hepatitis A vaccine.** / Consult your health care provider.  Hepatitis B vaccine.** / Consult your health care provider.  Haemophilus influenzae type b (Hib) vaccine.** / Consult your health care provider. Ages 2 years and over  Blood pressure check.** / Every 1 to 2 years.  Lipid and cholesterol check.** / Every 5 years beginning at age 24 years.  Lung cancer screening. / Every year if you are aged 74 80 years and have a 30-pack-year history of smoking and currently smoke or have quit within the past 15 years.  Yearly screening is stopped once you have quit smoking for at least 15 years or develop a health problem that would prevent you from having lung cancer treatment.  Clinical breast exam.** / Every year after age 46 years.  BRCA-related cancer risk assessment.** / For women who have family members with a BRCA-related cancer (breast, ovarian, tubal, or peritoneal cancers).  Mammogram.** / Every year beginning at age 49 years and continuing for as long as you are in good health. Consult with your health care provider.  Pap test.** / Every 3 years starting at age 77 years through age 26 or 74 years with 3 consecutive normal Pap tests. Testing can be stopped between 65 and 70 years with 3 consecutive normal Pap tests and no abnormal Pap or HPV tests in the past 10 years.  HPV screening.** / Every 3 years from ages 96 years through ages 59 or 18 years with a history of 3 consecutive normal Pap tests. Testing can be stopped between 65 and 70 years with 3 consecutive normal Pap tests and no abnormal Pap or HPV tests in the past 10 years.  Fecal occult blood test (FOBT) of stool. / Every year beginning at age 42 years and continuing until age 62 years. You may not need to do this test if you get a colonoscopy every 10 years.  Flexible sigmoidoscopy or colonoscopy.** / Every 5 years for a flexible sigmoidoscopy or every 10 years for a colonoscopy beginning at age 72 years and continuing until age 105 years.  Hepatitis C blood test.** / For all people born from [birth year] through 1966 and any individual with known risks for hepatitis C.  Osteoporosis screening.** / A one-time screening for women ages 78 years and over and women at risk for fractures or osteoporosis.  Skin self-exam. / Monthly.  Influenza vaccine. / Every year.  Tetanus, diphtheria, and acellular pertussis (Tdap/Td) vaccine.** / 1 dose of Td every 10 years.  Varicella vaccine.** / Consult your health care provider.  Zoster vaccine.** / 1  dose for adults aged 32 years or older.  Pneumococcal 13-valent conjugate (PCV13) vaccine.** / Consult your health care provider.  Pneumococcal polysaccharide (PPSV23) vaccine.** / 1 dose for all adults aged 76 years and older.  Meningococcal vaccine.** / Consult your health care provider.  Hepatitis A vaccine.** / Consult your health care provider.  Hepatitis B vaccine.** / Consult your health care provider.  Haemophilus influenzae type b (Hib) vaccine.** / Consult your health care provider. ** Family history and personal history of risk and conditions may change your health care provider's recommendations. Document Released: 09/17/2001 Document Revised: 05/12/2013 Document Reviewed: 12/17/2010 [facility] Patient Information 2014 [place], [place].

## 2013-10-29 NOTE — Progress Notes (Signed)
  Subjective:     [name] is a 43 y.o. female and is here for a comprehensive physical exam. The patient reports no problems.  Here for pap--her primary MD could not see her cervix.  She has a mammogram scheduled.  History   Social History  . Marital Status: Married    Spouse Name: N/A    Number of Children: N/A  . Years of Education: N/A   Occupational History  . Not on file.   Social History Main Topics  . Smoking status: Never Smoker   . Smokeless tobacco: Never Used  . Alcohol Use: Yes     Comment: Occasional wine  . Drug Use: No  . Sexual Activity: Yes    Partners: Male    Birth Control/ Protection: Pill   Other Topics Concern  . Not on file   Social History Narrative   Caffeine use: daily   Occupation: cleans houses   Regular exercise: yes, active 5x/wk   Diet: good water, fruits/vegetables daily   Health Maintenance  Topic Date Due  . Foot Exam  07/24/1980  . Pap Smear  07/24/1988  . Pneumococcal Polysaccharide Vaccine (##2) 04/05/2010  . Ophthalmology Exam  12/08/2013  . Influenza Vaccine  03/05/2014  . Hemoglobin A1c  03/08/2014  . Urine Microalbumin  09/08/2014  . Tetanus/tdap  09/21/2023    The following portions of the patient's history were reviewed and updated as appropriate: allergies, current medications, past family history, past medical history, past social history, past surgical history and problem list.  Review of Systems A comprehensive review of systems was negative.   Objective:    BP 130/85  Pulse 92  Ht 6' (1.829 m)  Wt 172 lb (78.019 kg)  BMI 23.32 kg/m2  LMP 10/09/2013 General appearance: alert, cooperative and appears stated age Head: Normocephalic, without obvious abnormality, atraumatic Neck: no adenopathy, supple, symmetrical, trachea midline and thyroid not enlarged, symmetric, no tenderness/mass/nodules Lungs: clear to auscultation bilaterally Heart: regular rate and rhythm, S1, S2 normal, no murmur,  click, rub or gallop Abdomen: soft, non-tender; bowel sounds normal; no masses,  no organomegaly Pelvic: cervix normal in appearance, external genitalia normal, no adnexal masses or tenderness, no cervical motion tenderness, uterus normal size, shape, and consistency and vagina normal without discharge Extremities: extremities normal, atraumatic, no cyanosis or edema Pulses: 2+ and symmetric Skin: Skin color, texture, turgor normal. No rashes or lesions Lymph nodes: Cervical, supraclavicular, and axillary nodes normal. Neurologic: Grossly normal    Assessment:    Healthy female exam. DM--on OC's     Plan:  Pap smear today F/u with primary care   See After Visit Summary for Counseling Recommendations

## 2013-11-06 ENCOUNTER — Other Ambulatory Visit: Payer: Self-pay | Admitting: Family Medicine

## 2013-11-06 NOTE — Telephone Encounter (Signed)
plz phone lorazepam in.

## 2013-11-08 NOTE — Telephone Encounter (Addendum)
[name] from [place] requested to call in Ativan again; the person that took earlier call did not put directions; Medication phoned to [facility]  pharmacy as instructed.

## 2013-11-08 NOTE — Telephone Encounter (Signed)
Rx called to pharmacy as instructed. 

## 2013-11-18 ENCOUNTER — Ambulatory Visit: Payer: Medicaid Other | Admitting: Internal Medicine

## 2013-12-01 ENCOUNTER — Other Ambulatory Visit: Payer: Self-pay | Admitting: Family Medicine

## 2013-12-01 NOTE — Telephone Encounter (Signed)
Ok to refill 

## 2013-12-01 NOTE — Telephone Encounter (Signed)
Phoned in to pharmacy. 

## 2013-12-01 NOTE — Telephone Encounter (Signed)
plz phone in. 

## 2013-12-14 ENCOUNTER — Ambulatory Visit (INDEPENDENT_AMBULATORY_CARE_PROVIDER_SITE_OTHER): Payer: Medicaid Other | Admitting: Internal Medicine

## 2013-12-14 ENCOUNTER — Encounter: Payer: Self-pay | Admitting: Internal Medicine

## 2013-12-14 VITALS — BP 122/76 | HR 86 | Temp 98.1°F | Wt 164.5 lb

## 2013-12-14 DIAGNOSIS — L97519 Non-pressure chronic ulcer of other part of right foot with unspecified severity: Principal | ICD-10-CM

## 2013-12-14 DIAGNOSIS — E1169 Type 2 diabetes mellitus with other specified complication: Secondary | ICD-10-CM

## 2013-12-14 DIAGNOSIS — L02619 Cutaneous abscess of unspecified foot: Secondary | ICD-10-CM

## 2013-12-14 DIAGNOSIS — L03119 Cellulitis of unspecified part of limb: Secondary | ICD-10-CM

## 2013-12-14 DIAGNOSIS — T3695XA Adverse effect of unspecified systemic antibiotic, initial encounter: Secondary | ICD-10-CM

## 2013-12-14 DIAGNOSIS — L97529 Non-pressure chronic ulcer of other part of left foot with unspecified severity: Principal | ICD-10-CM

## 2013-12-14 DIAGNOSIS — L03115 Cellulitis of right lower limb: Secondary | ICD-10-CM

## 2013-12-14 DIAGNOSIS — E11621 Type 2 diabetes mellitus with foot ulcer: Secondary | ICD-10-CM

## 2013-12-14 DIAGNOSIS — L97509 Non-pressure chronic ulcer of other part of unspecified foot with unspecified severity: Secondary | ICD-10-CM

## 2013-12-14 DIAGNOSIS — B379 Candidiasis, unspecified: Secondary | ICD-10-CM

## 2013-12-14 MED ORDER — FLUCONAZOLE 150 MG PO TABS: 150.0000 mg | ORAL_TABLET | Freq: Once | ORAL | Status: DC

## 2013-12-14 MED ORDER — CLOBETASOL PROPIONATE 0.05 % EX CREA: TOPICAL_CREAM | Freq: Two times a day (BID) | CUTANEOUS | Status: DC

## 2013-12-14 MED ORDER — HYDROXYZINE HCL 10 MG PO TABS: 10.0000 mg | ORAL_TABLET | Freq: Three times a day (TID) | ORAL | Status: DC | PRN

## 2013-12-14 MED ORDER — SULFAMETHOXAZOLE-TMP DS 800-160 MG PO TABS: 1.0000 | ORAL_TABLET | Freq: Two times a day (BID) | ORAL | Status: DC

## 2013-12-14 NOTE — Progress Notes (Signed)
Subjective:    Patient ID: [name], female    DOB: August 07, 1970, 43 y.o.   MRN: [id number]  HPI  Pt presents to the clinic today with c/o sores on her bilateral feet. She notices this 4 days ago. The sores have been very itchy. She denies redness, or pus draining from the sores. She does have a history of DM1. She has followed with the wound center at [facility] in the past for similar sores.  Review of Systems      Past Medical History  Diagnosis Date  . Type 1 diabetes, uncontrolled, with neuropathy   . Mood disorder     anxiety  . Arthritis   . Migraines   . History of chicken pox   . Prurigo nodularis     with diabetic dermopathy    Current Outpatient Prescriptions  Medication Sig Dispense Refill  . aspirin EC 81 MG tablet Take 81 mg by mouth daily.      . Blood Glucose Monitoring Suppl (ONE TOUCH ULTRA SYSTEM KIT) W/DEVICE KIT 1 kit by Does not apply route once.  1 each  0  . Cholecalciferol (VITAMIN D) 2000 UNITS CAPS Take 1 capsule by mouth daily.      . clobetasol cream (TEMOVATE) 0.05 % Apply topically 2 (two) times daily. Apply to AA (not to open wounds)  45 g  0  . glucagon (GLUCAGON EMERGENCY) 1 MG injection Inject 1 mg into the muscle once as needed.  2 each  prn  . glucose blood test strip Use as advised to check sugars 3-4x a day  200 each  11  . insulin aspart (NOVOLOG FLEXPEN) 100 UNIT/ML FlexPen Inject 8 units into the skin at breakfast, 12 units at lunch and 7 units at dinner.  15 mL  3  . insulin aspart (NOVOLOG FLEXPEN) 100 UNIT/ML FlexPen INJECT INTO SKIN 8 UNITS IN THE MORNING, 12 UNITS AT LUNCH AND 7 UNITS AT DINNER. + SSI      . insulin glargine (LANTUS) 100 units/mL SOLN Inject 35 Units into the skin every morning.  15 mL  11  . Insulin Pen Needle (PEN NEEDLES 3/16") 31G X 5 MM MISC 1 each by Does not apply route 4 (four) times daily.  200 each  11  . LORazepam (ATIVAN) 0.5 MG tablet TAKE ONE TABLET BY MOUTH TWICE DAILY AS NEEDED  60 tablet  0  .  norethindrone-ethinyl estradiol (MICROGESTIN,JUNEL,LOESTRIN) 1-20 MG-MCG tablet Take 1 tablet by mouth daily.  1 Package  11  . ONE TOUCH LANCETS MISC Use as advised to check sugars 3-4x a day  200 each  11  . pregabalin (LYRICA) 100 MG capsule Take 1 capsule (100 mg total) by mouth 2 (two) times daily.  60 capsule  11  . sertraline (ZOLOFT) 50 MG tablet TAKE ONE TABLET BY MOUTH TWICE DAILY  60 tablet  6  . Vitamin D, Ergocalciferol, (DRISDOL) 50000 UNITS CAPS capsule Take 1 capsule (50,000 Units total) by mouth every 7 (seven) days.  12 capsule  0   No current facility-administered medications for this visit.    Allergies  Allergen Reactions  . Gabapentin Other (See Comments)    Dizziness    Family History  Problem Relation Age of Onset  . Diabetes Mother     type 2  . Hypertension Mother   . Thyroid disease Mother   . Bipolar disorder Mother   . Cancer Maternal Grandmother     Breast, stomach  . Cancer  Paternal Grandmother     stomach, lung (smoker)  . Diabetes Paternal Grandmother   . Diabetes Paternal Grandfather   . CAD Sister     stent  . Stroke Maternal Aunt   . Cancer Maternal Uncle     prostate  . CAD Maternal Aunt     stents  . Cancer Maternal Aunt 64    ovarian    History   Social History  . Marital Status: Married    Spouse Name: N/A    Number of Children: N/A  . Years of Education: N/A   Occupational History  . Not on file.   Social History Main Topics  . Smoking status: Never Smoker   . Smokeless tobacco: Never Used  . Alcohol Use: Yes     Comment: Occasional wine  . Drug Use: No  . Sexual Activity: Yes    Partners: Male    Birth Control/ Protection: Pill   Other Topics Concern  . Not on file   Social History Narrative   Caffeine use: daily   Occupation: cleans houses   Regular exercise: yes, active 5x/wk   Diet: good water, fruits/vegetables daily     Constitutional: Denies fever, malaise, fatigue, headache or abrupt weight  changes.  Skin: Pt reports sores on feet. Denies rashes, lesions or ulcercations.    No other specific complaints in a complete review of systems (except as listed in HPI above).  Objective:   Physical Exam  BP 122/76  Pulse 86  Temp(Src) 98.1 F (36.7 C) (Oral)  Wt 164 lb 8 oz (74.617 kg) Wt Readings from Last 3 Encounters:  12/14/13 164 lb 8 oz (74.617 kg)  10/29/13 172 lb (78.019 kg)  10/18/13 169 lb 8 oz (76.885 kg)    General: Appears her stated age, well developed, well nourished in NAD. Skin: Redness and swelling around ulcerations on right foot. Multiple open ulcerations noted on bilateral feet and lower leg secondary to scratching. Cardiovascular: Normal rate and rhythm. S1,S2 noted.  No murmur, rubs or gallops noted. No JVD or BLE edema. No carotid bruits noted. Pulmonary/Chest: Normal effort and positive vesicular breath sounds. No respiratory distress. No wheezes, rales or ronchi noted.    BMET    Component Value Date/Time   NA 134* 09/08/2013 0912   K 4.3 09/08/2013 0912   CL 100 09/08/2013 0912   CO2 29 09/08/2013 0912   GLUCOSE 246* 09/08/2013 0912   BUN 10 09/08/2013 0912   CREATININE 0.5 09/08/2013 0912   CALCIUM 9.0 09/08/2013 0912    Lipid Panel     Component Value Date/Time   CHOL 178 09/08/2013 0912   TRIG 34.0 09/08/2013 0912   HDL 67.30 09/08/2013 0912   CHOLHDL 3 09/08/2013 0912   VLDL 6.8 09/08/2013 0912   LDLCALC 104* 09/08/2013 0912    CBC    Component Value Date/Time   WBC 5.9 12/02/2012 0949   RBC 4.64 12/02/2012 0949   HGB 14.1 12/02/2012 0949   HCT 41.1 12/02/2012 0949   PLT 216.0 12/02/2012 0949   MCV 88.6 12/02/2012 0949   MCHC 34.4 12/02/2012 0949   RDW 12.8 12/02/2012 0949   LYMPHSABS 1.6 08/11/2008 1735   MONOABS 0.3 08/11/2008 1735   EOSABS 0.0 08/11/2008 1735   BASOSABS 0.1 08/11/2008 1735    Hgb A1C Lab Results  Component Value Date   HGBA1C 9.8* 09/08/2013         Assessment & Plan:   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

## 2013-12-14 NOTE — Patient Instructions (Addendum)
Diabetes and Foot Care Diabetes may cause you to have problems because of poor blood supply (circulation) to your feet and legs. This may cause the skin on your feet to become thinner, break easier, and heal more slowly. Your skin may become dry, and the skin may peel and crack. You may also have nerve damage in your legs and feet causing decreased feeling in them. You may not notice minor injuries to your feet that could lead to infections or more serious problems. Taking care of your feet is one of the most important things you can do for yourself.  HOME CARE INSTRUCTIONS  Wear shoes at all times, even in the house. Do not go barefoot. Bare feet are easily injured.  Check your feet daily for blisters, cuts, and redness. If you cannot see the bottom of your feet, use a mirror or ask someone for help.  Wash your feet with warm water (do not use hot water) and mild soap. Then pat your feet and the areas between your toes until they are completely dry. Do not soak your feet as this can dry your skin.  Apply a moisturizing lotion or petroleum jelly (that does not contain alcohol and is unscented) to the skin on your feet and to dry, brittle toenails. Do not apply lotion between your toes.  Trim your toenails straight across. Do not dig under them or around the cuticle. File the edges of your nails with an emery board or nail file.  Do not cut corns or calluses or try to remove them with medicine.  Wear clean socks or stockings every day. Make sure they are not too tight. Do not wear knee-high stockings since they may decrease blood flow to your legs.  Wear shoes that fit properly and have enough cushioning. To break in new shoes, wear them for just a few hours a day. This prevents you from injuring your feet. Always look in your shoes before you put them on to be sure there are no objects inside.  Do not cross your legs. This may decrease the blood flow to your feet.  If you find a minor scrape,  cut, or break in the skin on your feet, keep it and the skin around it clean and dry. These areas may be cleansed with mild soap and water. Do not cleanse the area with peroxide, alcohol, or iodine.  When you remove an adhesive bandage, be sure not to damage the skin around it.  If you have a wound, look at it several times a day to make sure it is healing.  Do not use heating pads or hot water bottles. They may burn your skin. If you have lost feeling in your feet or legs, you may not know it is happening until it is too late.  Make sure your health care provider performs a complete foot exam at least annually or more often if you have foot problems. Report any cuts, sores, or bruises to your health care provider immediately. SEEK MEDICAL CARE IF:   You have an injury that is not healing.  You have cuts or breaks in the skin.  You have an ingrown nail.  You notice redness on your legs or feet.  You feel burning or tingling in your legs or feet.  You have pain or cramps in your legs and feet.  Your legs or feet are numb.  Your feet always feel cold. SEEK IMMEDIATE MEDICAL CARE IF:   There is increasing redness,   swelling, or pain in or around a wound.  There is a red line that goes up your leg.  Pus is coming from a wound.  You develop a fever or as directed by your health care provider.  You notice a bad smell coming from an ulcer or wound. Document Released: 07/19/2000 Document Revised: 03/24/2013 Document Reviewed: 12/29/2012 [employer] Patient Information 2014 [employer].  

## 2013-12-14 NOTE — Progress Notes (Signed)
Pre visit review using our clinic review tool, if applicable. No additional management support is needed unless otherwise documented below in the visit note. 

## 2013-12-28 ENCOUNTER — Other Ambulatory Visit: Payer: Self-pay | Admitting: Internal Medicine

## 2013-12-28 NOTE — Telephone Encounter (Signed)
Needs appt for further refills.

## 2014-01-18 ENCOUNTER — Other Ambulatory Visit: Payer: Self-pay | Admitting: Internal Medicine

## 2014-02-14 ENCOUNTER — Telehealth: Payer: Self-pay | Admitting: *Deleted

## 2014-02-14 NOTE — Telephone Encounter (Signed)
Left message on pts vm requesting a call back to schedule DIABETIC BUNDLE LAB-needs A1C and LDL

## 2014-02-18 ENCOUNTER — Ambulatory Visit: Payer: Medicaid Other | Admitting: Family Medicine

## 2014-02-19 ENCOUNTER — Ambulatory Visit (INDEPENDENT_AMBULATORY_CARE_PROVIDER_SITE_OTHER): Payer: Medicaid Other | Admitting: Family

## 2014-02-19 VITALS — BP 122/72 | HR 87 | Temp 98.0°F | Wt 169.0 lb

## 2014-02-19 DIAGNOSIS — L97909 Non-pressure chronic ulcer of unspecified part of unspecified lower leg with unspecified severity: Secondary | ICD-10-CM

## 2014-02-19 DIAGNOSIS — T3695XA Adverse effect of unspecified systemic antibiotic, initial encounter: Secondary | ICD-10-CM

## 2014-02-19 DIAGNOSIS — L98491 Non-pressure chronic ulcer of skin of other sites limited to breakdown of skin: Secondary | ICD-10-CM

## 2014-02-19 DIAGNOSIS — L03115 Cellulitis of right lower limb: Secondary | ICD-10-CM

## 2014-02-19 DIAGNOSIS — L02619 Cutaneous abscess of unspecified foot: Secondary | ICD-10-CM

## 2014-02-19 DIAGNOSIS — B379 Candidiasis, unspecified: Secondary | ICD-10-CM

## 2014-02-19 DIAGNOSIS — L97929 Non-pressure chronic ulcer of unspecified part of left lower leg with unspecified severity: Secondary | ICD-10-CM

## 2014-02-19 DIAGNOSIS — L97919 Non-pressure chronic ulcer of unspecified part of right lower leg with unspecified severity: Secondary | ICD-10-CM

## 2014-02-19 DIAGNOSIS — L03119 Cellulitis of unspecified part of limb: Secondary | ICD-10-CM

## 2014-02-19 DIAGNOSIS — L98499 Non-pressure chronic ulcer of skin of other sites with unspecified severity: Secondary | ICD-10-CM

## 2014-02-19 MED ORDER — FLUCONAZOLE 150 MG PO TABS: 150.0000 mg | ORAL_TABLET | Freq: Once | ORAL | Status: DC

## 2014-02-19 MED ORDER — SULFAMETHOXAZOLE-TMP DS 800-160 MG PO TABS: 1.0000 | ORAL_TABLET | Freq: Two times a day (BID) | ORAL | Status: DC

## 2014-02-19 NOTE — Progress Notes (Signed)
Subjective:    Patient ID: [name], female    DOB: Sep 27, 1970, 43 y.o.   MRN: [id number]  HPI  [name] is a 43 yr old female who presents today with complaints of pruritis and secondary sores on bilateral legs.  She has long standing history of same and has been followed at the wound care clinic in the past.  She last saw Webb Silversmith on 12/14/13 for same and was rx'd refills on temovate/hydroxyzine + given rx for septra and diflucan.    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  Uses cetaphil, unscented products.   Review of Systems See HPI  Past Medical History  Diagnosis Date  . Type 1 diabetes, uncontrolled, with neuropathy   . Mood disorder     anxiety  . Arthritis   . Migraines   . History of chicken pox   . Prurigo nodularis     with diabetic dermopathy    History   Social History  . Marital Status: Married    Spouse Name: N/A    Number of Children: N/A  . Years of Education: N/A   Occupational History  . Not on file.   Social History Main Topics  . Smoking status: Never Smoker   . Smokeless tobacco: Never Used  . Alcohol Use: Yes     Comment: Occasional wine  . Drug Use: No  . Sexual Activity: Yes    Partners: Male    Birth Control/ Protection: Pill   Other Topics Concern  . Not on file   Social History Narrative   Caffeine use: daily   Occupation: cleans houses   Regular exercise: yes, active 5x/wk   Diet: good water, fruits/vegetables daily    Past Surgical History  Procedure Laterality Date  . Treadmill stress test  01/2013    WNL, low risk study    Family History  Problem Relation Age of Onset  . Diabetes Mother     type 2  . Hypertension Mother   . Thyroid disease Mother   . Bipolar disorder Mother   . Cancer Maternal Grandmother     Breast, stomach  . Cancer Paternal Grandmother     stomach, lung (smoker)  . Diabetes Paternal Grandmother   . Diabetes Paternal Grandfather   . CAD Sister     stent  . Stroke  Maternal Aunt   . Cancer Maternal Uncle     prostate  . CAD Maternal Aunt     stents  . Cancer Maternal Aunt 64    ovarian    Allergies  Allergen Reactions  . Gabapentin Other (See Comments)    Dizziness    Current Outpatient Prescriptions on File Prior to Visit  Medication Sig Dispense Refill  . aspirin EC 81 MG tablet Take 81 mg by mouth daily.      . Blood Glucose Monitoring Suppl (ONE TOUCH ULTRA SYSTEM KIT) W/DEVICE KIT 1 kit by Does not apply route once.  1 each  0  . Cholecalciferol (VITAMIN D) 2000 UNITS CAPS Take 1 capsule by mouth daily.      . clobetasol cream (TEMOVATE) 0.05 % Apply topically 2 (two) times daily. Apply to AA (not to open wounds)  45 g  0  . glucagon (GLUCAGON EMERGENCY) 1 MG injection Inject 1 mg into the muscle once as needed.  2 each  prn  . glucose blood test strip Use as advised to check sugars 3-4x a day  200 each  11  .  hydrOXYzine (ATARAX/VISTARIL) 10 MG tablet Take 1 tablet (10 mg total) by mouth 3 (three) times daily as needed.  30 tablet  0  . insulin aspart (NOVOLOG FLEXPEN) 100 UNIT/ML FlexPen Inject 8 units into the skin at breakfast, 12 units at lunch and 7 units at dinner.  15 mL  3  . insulin aspart (NOVOLOG FLEXPEN) 100 UNIT/ML FlexPen INJECT INTO SKIN 8 UNITS IN THE MORNING, 12 UNITS AT LUNCH AND 7 UNITS AT DINNER. + SSI      . Insulin Pen Needle (PEN NEEDLES 3/16") 31G X 5 MM MISC 1 each by Does not apply route 4 (four) times daily.  200 each  11  . LANTUS SOLOSTAR 100 UNIT/ML Solostar Pen INJECT 35 UNITS into THE SKIN EVERY MORNING  15 mL  0  . LORazepam (ATIVAN) 0.5 MG tablet TAKE ONE TABLET BY MOUTH TWICE DAILY AS NEEDED  60 tablet  0  . norethindrone-ethinyl estradiol (MICROGESTIN,JUNEL,LOESTRIN) 1-20 MG-MCG tablet Take 1 tablet by mouth daily.  1 Package  11  . ONE TOUCH LANCETS MISC Use as advised to check sugars 3-4x a day  200 each  11  . pregabalin (LYRICA) 100 MG capsule Take 1 capsule (100 mg total) by mouth 2 (two) times  daily.  60 capsule  11  . sertraline (ZOLOFT) 50 MG tablet TAKE ONE TABLET BY MOUTH TWICE DAILY  60 tablet  6  . Vitamin D, Ergocalciferol, (DRISDOL) 50000 UNITS CAPS capsule Take 1 capsule (50,000 Units total) by mouth every 7 (seven) days.  12 capsule  0   No current facility-administered medications on file prior to visit.    BP 122/72  Pulse 87  Temp(Src) 98 F (36.7 C) (Oral)  Wt 169 lb (76.658 kg)  SpO2 98%       Objective:   Physical Exam  Constitutional: She is oriented to person, place, and time. She appears well-developed and well-nourished. No distress.  Cardiovascular: Normal rate and regular rhythm.   No murmur heard. Pulses:      Dorsalis pedis pulses are 2+ on the right side, and 2+ on the left side.       Posterior tibial pulses are 1+ on the right side, and 1+ on the left side.  Pulmonary/Chest: Effort normal and breath sounds normal. No respiratory distress. She has no wheezes. She has no rales. She exhibits no tenderness.  Musculoskeletal: She exhibits no edema.  Neurological: She is alert and oriented to person, place, and time.  Skin:  Multiple dry, ulcerated lesions on bilateral LE. Some foul odor is noted.    Psychiatric: She has a normal mood and affect. Her behavior is normal. Judgment and thought content normal.          Assessment & Plan:  Pt requests empiric rx for diflucan for vaginitis in setting of abx.

## 2014-02-19 NOTE — Patient Instructions (Addendum)
Add zyrtec $RemoveBe'10mg'[id number]$  once daily. Start bactrim for wounds. You may use diflucan as needed for vaginal itching. You will be contacted about your referrals to Dermatology and Allergist and Arterial Duplex.  Please let us know if you have not heard back within 1 week about your referral. Follow up with Dr. Leo Grosser in 1 week.

## 2014-02-20 DIAGNOSIS — L97919 Non-pressure chronic ulcer of unspecified part of right lower leg with unspecified severity: Secondary | ICD-10-CM | POA: Insufficient documentation

## 2014-02-20 DIAGNOSIS — L97929 Non-pressure chronic ulcer of unspecified part of left lower leg with unspecified severity: Secondary | ICD-10-CM

## 2014-02-20 HISTORY — DX: Non-pressure chronic ulcer of unspecified part of right lower leg with unspecified severity: L97.929

## 2014-02-20 HISTORY — DX: Non-pressure chronic ulcer of unspecified part of left lower leg with unspecified severity: L97.919

## 2014-02-20 NOTE — Assessment & Plan Note (Signed)
Rx with bactrim for empiric mrsa coverage.  Advised pt to keep legs wrapped to avoid scratching and allow healing. Diminished DP pulses bilaterally, will obtain bilateral ABI.  To help with pruritis, will add zyrtec to provide 24 hr antihistamine coverage.  Etiology of lower extrem pruritis is unclear- could related to neuropathy (she is on lyrica and could not tolerated gabapentin).  Could be anxiety related- she is maintained on zoloft. Will refer to dermatology for futher eval of ulcers, could benefit from skin biopsy to rule out autoimmune etiology. Will also refer to allergist for further testing.

## 2014-02-25 ENCOUNTER — Ambulatory Visit (HOSPITAL_COMMUNITY): Payer: Medicaid Other | Attending: Internal Medicine | Admitting: Cardiology

## 2014-02-25 ENCOUNTER — Encounter: Payer: Self-pay | Admitting: Family

## 2014-02-25 DIAGNOSIS — L98499 Non-pressure chronic ulcer of skin of other sites with unspecified severity: Secondary | ICD-10-CM | POA: Insufficient documentation

## 2014-02-25 DIAGNOSIS — L97909 Non-pressure chronic ulcer of unspecified part of unspecified lower leg with unspecified severity: Secondary | ICD-10-CM | POA: Insufficient documentation

## 2014-02-25 DIAGNOSIS — L98491 Non-pressure chronic ulcer of skin of other sites limited to breakdown of skin: Secondary | ICD-10-CM

## 2014-02-25 DIAGNOSIS — E1159 Type 2 diabetes mellitus with other circulatory complications: Secondary | ICD-10-CM

## 2014-02-25 DIAGNOSIS — I739 Peripheral vascular disease, unspecified: Secondary | ICD-10-CM

## 2014-02-25 NOTE — Progress Notes (Signed)
LEA Doppler performed 

## 2014-03-11 ENCOUNTER — Other Ambulatory Visit: Payer: Self-pay | Admitting: Internal Medicine

## 2014-03-11 NOTE — Telephone Encounter (Signed)
Ok to refill in Dr. Synthia Innocent absence? You apparently prescribed for patient on 12/14/13 #30 with 0RF

## 2014-03-18 ENCOUNTER — Other Ambulatory Visit: Payer: Self-pay | Admitting: Internal Medicine

## 2014-03-18 ENCOUNTER — Other Ambulatory Visit: Payer: Self-pay | Admitting: *Deleted

## 2014-03-18 ENCOUNTER — Other Ambulatory Visit: Payer: Self-pay | Admitting: Family Medicine

## 2014-03-18 MED ORDER — PREGABALIN 100 MG PO CAPS: ORAL_CAPSULE | ORAL | Status: DC

## 2014-03-30 ENCOUNTER — Ambulatory Visit: Payer: Medicaid Other | Admitting: Family Medicine

## 2014-04-07 ENCOUNTER — Other Ambulatory Visit: Payer: Self-pay | Admitting: Family Medicine

## 2014-04-07 NOTE — Telephone Encounter (Signed)
plz phone in. 

## 2014-04-07 NOTE — Telephone Encounter (Signed)
Rx called in as directed.   

## 2014-04-07 NOTE — Telephone Encounter (Signed)
Ok to refill 

## 2014-04-08 NOTE — Telephone Encounter (Signed)
Pt said has been taking lorazepam 3 x a day due to stress level; pt scheduled appt to see Dr Darnell Level for f/u visit and to discuss lorazepam dosage.

## 2014-04-12 ENCOUNTER — Ambulatory Visit (INDEPENDENT_AMBULATORY_CARE_PROVIDER_SITE_OTHER): Payer: Medicaid Other | Admitting: Family Medicine

## 2014-04-12 ENCOUNTER — Encounter: Payer: Self-pay | Admitting: Family Medicine

## 2014-04-12 VITALS — BP 110/70 | HR 96 | Temp 98.2°F | Wt 164.0 lb

## 2014-04-12 DIAGNOSIS — L97529 Non-pressure chronic ulcer of other part of left foot with unspecified severity: Secondary | ICD-10-CM

## 2014-04-12 DIAGNOSIS — L97909 Non-pressure chronic ulcer of unspecified part of unspecified lower leg with unspecified severity: Secondary | ICD-10-CM

## 2014-04-12 DIAGNOSIS — F39 Unspecified mood [affective] disorder: Secondary | ICD-10-CM

## 2014-04-12 DIAGNOSIS — Z23 Encounter for immunization: Secondary | ICD-10-CM

## 2014-04-12 DIAGNOSIS — E10621 Type 1 diabetes mellitus with foot ulcer: Secondary | ICD-10-CM

## 2014-04-12 DIAGNOSIS — E1049 Type 1 diabetes mellitus with other diabetic neurological complication: Secondary | ICD-10-CM

## 2014-04-12 DIAGNOSIS — L97929 Non-pressure chronic ulcer of unspecified part of left lower leg with unspecified severity: Secondary | ICD-10-CM

## 2014-04-12 DIAGNOSIS — E1040 Type 1 diabetes mellitus with diabetic neuropathy, unspecified: Secondary | ICD-10-CM

## 2014-04-12 DIAGNOSIS — L97509 Non-pressure chronic ulcer of other part of unspecified foot with unspecified severity: Secondary | ICD-10-CM

## 2014-04-12 DIAGNOSIS — R21 Rash and other nonspecific skin eruption: Secondary | ICD-10-CM

## 2014-04-12 DIAGNOSIS — E1142 Type 2 diabetes mellitus with diabetic polyneuropathy: Secondary | ICD-10-CM

## 2014-04-12 DIAGNOSIS — E1069 Type 1 diabetes mellitus with other specified complication: Secondary | ICD-10-CM

## 2014-04-12 DIAGNOSIS — L97919 Non-pressure chronic ulcer of unspecified part of right lower leg with unspecified severity: Secondary | ICD-10-CM

## 2014-04-12 MED ORDER — CITALOPRAM HYDROBROMIDE 20 MG PO TABS: 20.0000 mg | ORAL_TABLET | Freq: Every day | ORAL | Status: DC

## 2014-04-12 NOTE — Progress Notes (Signed)
.   BP 110/70  Pulse 96  Temp(Src) 98.2 F (36.8 C) (Oral)  Wt 164 lb (74.39 kg)  LMP 04/06/2014   CC: discuss lorazepam use Subjective:    Patient ID: [name], female    DOB: Jun 06, 1971, 43 y.o.   MRN: [id number]  HPI: [name] is a 43 y.o. female presenting on 04/12/2014 for Follow-up   Son just had major intestinal surgery. Mother recently in ICU at [facility] and on vent. ?parkinson disease. Husband just lost his job. Increasing stress recently due to all of this.  2/2 this has increased lorazepam use - to BID to TID scheduled regularly. Ran out of lorazepam. May have had seizure episode last week at a client's house where she fell on the floor and client stated her eyes rolled back and she had some generalized movements - client stated she was confused after episode, but no tongue biting or urinary incontinence. She refilled her benzo 04/07/2014 #60 lorazepam 0.97m bid  Foot ulcers - with itching. Has been unable to see dermatologist or podiatrist 2/2 new insurance (medicaid).  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   Lab Results  Component Value Date   HGBA1C 10.5* 04/12/2014  DM - sugars chronically out of control. Reports compliance with lantus 35 units at bedtime, 5u novolog if mealtime sugars >145, +2 for every 6 units above 145. Has not seen endocrinologist this year.   Relevant past medical, surgical, family and social history reviewed and updated as indicated.  Allergies and medications reviewed and updated. Current Outpatient Prescriptions on File Prior to Visit  Medication Sig  . aspirin EC 81 MG tablet Take 81 mg by mouth daily.  . Blood Glucose Monitoring Suppl (ONE TOUCH ULTRA SYSTEM KIT) W/DEVICE KIT 1 kit by Does not apply route once.  . cetirizine (ZYRTEC) 10 MG tablet Take 10 mg by mouth daily.  .Marland Kitchenglucagon (GLUCAGON EMERGENCY) 1 MG injection Inject 1 mg into the muscle once as needed.  .Marland Kitchenglucose blood  test strip Use as advised to check sugars 3-4x a day  . hydrOXYzine (ATARAX/VISTARIL) 10 MG tablet TAKE ONE TABLET BY MOUTH THREE TIMES DAILY AS NEEDED  . insulin aspart (NOVOLOG FLEXPEN) 100 UNIT/ML FlexPen INJECT INTO SKIN 8 UNITS IN THE MORNING, 12 UNITS AT LUNCH AND 7 UNITS AT DINNER. + SSI  . Insulin Pen Needle (PEN NEEDLES 3/16") 31G X 5 MM MISC 1 each by Does not apply route 4 (four) times daily.  .Marland KitchenLANTUS SOLOSTAR 100 UNIT/ML Solostar Pen INJECT 35 UNITS into THE SKIN EVERY MORNING  . LORazepam (ATIVAN) 0.5 MG tablet TAKE ONE TABLET BY MOUTH TWICE DAILY AS NEEDED  . norethindrone-ethinyl estradiol (MICROGESTIN,JUNEL,LOESTRIN) 1-20 MG-MCG tablet Take 1 tablet by mouth daily.  .Marland KitchenNOVOLOG FLEXPEN 100 UNIT/ML FlexPen Inject 8 units into the skin at breakfast, 12 units at lunch and 7 units at dinner.  . ONE TOUCH LANCETS MISC Use as advised to check sugars 3-4x a day  . pregabalin (LYRICA) 100 MG capsule TAKE ONE CAPSULE BY MOUTH TWICE DAILY  . Cholecalciferol (VITAMIN D) 2000 UNITS CAPS Take 1 capsule by mouth daily.   No current facility-administered medications on file prior to visit.    Review of Systems Per HPI unless specifically indicated above    Objective:    BP 110/70  Pulse 96  Temp(Src) 98.2 F (36.8 C) (Oral)  Wt 164 lb (74.39 kg)  LMP 04/06/2014  Physical Exam  Nursing note and  vitals reviewed. Constitutional: She appears well-developed and well-nourished. No distress.  HENT:  Mouth/Throat: Oropharynx is clear and moist. No oropharyngeal exudate.  Cardiovascular: Normal rate, regular rhythm, normal heart sounds and intact distal pulses.   No murmur heard. Pulmonary/Chest: Effort normal and breath sounds normal. No respiratory distress. She has no wheezes. She has no rales.  Musculoskeletal: She exhibits no edema.  Diabetic foot exam: + calluses and skin breakdown as per skin section Normal DP pulses (2+) Normal sensation to light touch and decreased to  monofilament bilateral soles Nails thickened  Skin: Skin is warm and dry. Rash noted. No erythema.  L midfoot with 2 small shallow ulcers, medial heel with large blister R midfoot with keloid at scars from prior rash       Assessment & Plan:   Problem List Items Addressed This Visit   Ulcers of both lower legs     Recurrent diabetic foot ulcer in h/o same - but no significant evidence of infection today so no abx started. Good pulses noted - placed in unna boot on left - return in 5-7 for recheck. Has been unable to see derm or allergist.    Relevant Orders      CBC with Differential (Completed)   Type 1 diabetes mellitus with diabetic neuropathy - Primary (Chronic)     Has not followed up with endo - remains uncontrolled. Will check A1c today. Encouraged she reestablish with endo.    Relevant Orders      LDL cholesterol, direct (Completed)      Basic metabolic panel (Completed)      Hemoglobin A1c (Completed)      HM DIABETES FOOT EXAM (Completed)   Skin rash   Mood disorder     Recently worsened due to stressors which led her to dangerously self titrate lorazepam up to scheduled. Then ran out and describes benzo withdrawal seizure last week. Discussed concerns with benzodiazepines including abuse/addiction potential and dependence/tolerance, and dangerous withdrawal possiblity. Advised not to stop this med suddenly in the future - and recommended we slowly taper her off benzo. In interim, will transition from sertraline 10m bid to celexa 275mdaily. f/u in 2-3 weeks.    Diabetic ulcer of left foot    Other Visit Diagnoses   Need for prophylactic vaccination and inoculation against influenza        Relevant Orders       Flu Vaccine QUAD 36+ mos PF IM (Fluarix Quad PF) (Completed)        Follow up plan: Return in about 3 weeks (around 05/03/2014), or if symptoms worsen or fail to improve, for follow up visit.

## 2014-04-12 NOTE — Progress Notes (Signed)
Pre visit review using our clinic review tool, if applicable. No additional management support is needed unless otherwise documented below in the visit note. 

## 2014-04-12 NOTE — Patient Instructions (Addendum)
Flu shot today  Blood work today. Let's stop zoloft. Start celexa $RemoveBefo'20mg'OOvyGzWPJCa$  one daily. For feet - unna boot placed on left foot. Taper off lorazepam - decrease to 1 tablet daily for 2 weeks then decrease to 1/2 tablet daily until done. Blood work today. Return to see me in 2-3 weeks for follow up, return in 1 week for wound check.

## 2014-04-13 LAB — CBC WITH DIFFERENTIAL/PLATELET
BASOS ABS: 0 10*3/uL (ref 0.0–0.1)
BASOS PCT: 0.6 % (ref 0.0–3.0)
EOS ABS: 0 10*3/uL (ref 0.0–0.7)
Eosinophils Relative: 0.8 % (ref 0.0–5.0)
HCT: 37.6 % (ref 36.0–46.0)
Hemoglobin: 12.6 g/dL (ref 12.0–15.0)
LYMPHS PCT: 32.6 % (ref 12.0–46.0)
Lymphs Abs: 1.7 10*3/uL (ref 0.7–4.0)
MCHC: 33.4 g/dL (ref 30.0–36.0)
MCV: 88 fl (ref 78.0–100.0)
Monocytes Absolute: 0.2 10*3/uL (ref 0.1–1.0)
Monocytes Relative: 3.1 % (ref 3.0–12.0)
NEUTROS PCT: 62.9 % (ref 43.0–77.0)
Neutro Abs: 3.3 10*3/uL (ref 1.4–7.7)
PLATELETS: 257 10*3/uL (ref 150.0–400.0)
RBC: 4.27 Mil/uL (ref 3.87–5.11)
RDW: 13.4 % (ref 11.5–15.5)
WBC: 5.3 10*3/uL (ref 4.0–10.5)

## 2014-04-13 LAB — LDL CHOLESTEROL, DIRECT: Direct LDL: 104.1 mg/dL

## 2014-04-13 LAB — BASIC METABOLIC PANEL
BUN: 9 mg/dL (ref 6–23)
CHLORIDE: 101 meq/L (ref 96–112)
CO2: 28 mEq/L (ref 19–32)
Calcium: 9 mg/dL (ref 8.4–10.5)
Creatinine, Ser: 0.6 mg/dL (ref 0.4–1.2)
GFR: 148.38 mL/min (ref >60.00)
Glucose, Bld: 198 mg/dL — ABNORMAL HIGH (ref 70–99)
Potassium: 4 mEq/L (ref 3.5–5.1)
SODIUM: 135 meq/L (ref 135–145)

## 2014-04-13 LAB — HEMOGLOBIN A1C: Hgb A1c MFr Bld: 10.5 % — ABNORMAL HIGH (ref 4.6–6.5)

## 2014-04-13 NOTE — Assessment & Plan Note (Signed)
Has not followed up with endo - remains uncontrolled. Will check A1c today. Encouraged she reestablish with endo.

## 2014-04-13 NOTE — Assessment & Plan Note (Signed)
Recently worsened due to stressors which led her to dangerously self titrate lorazepam up to scheduled. Then ran out and describes benzo withdrawal seizure last week. Discussed concerns with benzodiazepines including abuse/addiction potential and dependence/tolerance, and dangerous withdrawal possiblity. Advised not to stop this med suddenly in the future - and recommended we slowly taper her off benzo. In interim, will transition from sertraline $RemoveBeforeD'50mg'CoJJMzfrbSOVxu$  bid to celexa $RemoveB'20mg'jlYgWWse$  daily. f/u in 2-3 weeks.

## 2014-04-13 NOTE — Assessment & Plan Note (Signed)
Recurrent diabetic foot ulcer in h/o same - but no significant evidence of infection today so no abx started. Good pulses noted - placed in unna boot on left - return in 5-7 for recheck. Has been unable to see derm or allergist.

## 2014-04-14 ENCOUNTER — Telehealth: Payer: Self-pay

## 2014-04-14 DIAGNOSIS — E1040 Type 1 diabetes mellitus with diabetic neuropathy, unspecified: Secondary | ICD-10-CM

## 2014-04-14 MED ORDER — ATORVASTATIN CALCIUM 10 MG PO TABS: 10.0000 mg | ORAL_TABLET | Freq: Every day | ORAL | Status: DC

## 2014-04-14 NOTE — Telephone Encounter (Signed)
Patient notified and verbalized understanding. 

## 2014-04-14 NOTE — Telephone Encounter (Signed)
Dr Danise Mina, pt called back and is willing to try low dose of Lipitor to [facility] pharmacy. Pt request to see endocrinologist in [place]. Pt did not know how to respond in mychart.

## 2014-04-14 NOTE — Telephone Encounter (Signed)
referral placed lipitor sent to pharmacy. Make sure she knows to avoid pregnancy while on this medication.

## 2014-04-19 ENCOUNTER — Encounter: Payer: Self-pay | Admitting: Endocrinology

## 2014-04-19 ENCOUNTER — Ambulatory Visit (INDEPENDENT_AMBULATORY_CARE_PROVIDER_SITE_OTHER): Payer: Medicaid Other | Admitting: Endocrinology

## 2014-04-19 VITALS — BP 108/70 | HR 82 | Ht 72.0 in | Wt 163.2 lb

## 2014-04-19 DIAGNOSIS — L97519 Non-pressure chronic ulcer of other part of right foot with unspecified severity: Secondary | ICD-10-CM

## 2014-04-19 DIAGNOSIS — E1069 Type 1 diabetes mellitus with other specified complication: Secondary | ICD-10-CM

## 2014-04-19 DIAGNOSIS — E10621 Type 1 diabetes mellitus with foot ulcer: Secondary | ICD-10-CM

## 2014-04-19 DIAGNOSIS — E1142 Type 2 diabetes mellitus with diabetic polyneuropathy: Secondary | ICD-10-CM

## 2014-04-19 DIAGNOSIS — E1040 Type 1 diabetes mellitus with diabetic neuropathy, unspecified: Secondary | ICD-10-CM

## 2014-04-19 DIAGNOSIS — E1049 Type 1 diabetes mellitus with other diabetic neurological complication: Secondary | ICD-10-CM

## 2014-04-19 DIAGNOSIS — L97529 Non-pressure chronic ulcer of other part of left foot with unspecified severity: Secondary | ICD-10-CM

## 2014-04-19 DIAGNOSIS — Z862 Personal history of diseases of the blood and blood-forming organs and certain disorders involving the immune mechanism: Secondary | ICD-10-CM

## 2014-04-19 DIAGNOSIS — Z8639 Personal history of other endocrine, nutritional and metabolic disease: Secondary | ICD-10-CM

## 2014-04-19 DIAGNOSIS — L97509 Non-pressure chronic ulcer of other part of unspecified foot with unspecified severity: Secondary | ICD-10-CM

## 2014-04-19 NOTE — Progress Notes (Signed)
Pre visit review using our clinic review tool, if applicable. No additional management support is needed unless otherwise documented below in the visit note. 

## 2014-04-19 NOTE — Assessment & Plan Note (Signed)
She has non healing ulcers on her bilateral feet. Encouraged follow back with podiatry and PCP. She was instructed to wear close toe shoes particularly when home cleaning. She may benefit from getting fitted for DM shoes.

## 2014-04-19 NOTE — Assessment & Plan Note (Addendum)
Recent A1c uncontrolled. Encouraged her to check sugars 3-4 times daily. Discussed importance of checking sugars. Currently , I have no data on her sugars to make adjustments to her current regimen. Due to reported hypoglycemia recently, will decrease lantus to 33 units daily and change Novolog to 6/12/8 qac.  Discussed hypoglycemia recognition and treatment protocol.  Continue regular self foot care and lyrica for neuropathy.  She will start the low dose lipitor and report back if does not tolerate.

## 2014-04-19 NOTE — Assessment & Plan Note (Signed)
Discussed about thyroid function and thyroid nodules. Check TSH at next visits. Recent TSH about 7 months ago was normal.  Recommend follow up thyroid US this visit to assess size stability of thyroid nodules. She is busy with her mom's medical condition now, but is willing to schedule appt in about 1 month.  Have ordered thyroid US at [facility] imaging, where she had it done last in 2010.

## 2014-04-19 NOTE — Patient Instructions (Signed)
Refer to podiatry for wound management and consideration of Dm shoes.   Wound follow up with PCP as well.   She will try low dose lipitor as directed and report back if does not tolerate.   Refer for thyroid US for evaluation of nodules.  Change Lantus to 33 units daily.  Change Novolog to 6 units with BF, 12 units with lunch and 8 units with supper.   Check sugars 4 x daily ( before each meal and at bedtime).  Record them in a log book and bring that/meter to next appointment.   Please come back for a follow-up appointment in 2 weeks

## 2014-04-19 NOTE — Progress Notes (Signed)
REASON FOR VISIT- [name] is a 43 y.o.-year-old female, re-referred by Ria Bush, MD, for management of Type 1 Diabetes Mellitus, uncontrolled, with complications ( diagnosed 1996 as GDM, later as insulin requiring 2536, has complications of neuropathy and feet ulcers). Last seen by Dr Cruzita Lederer for DM care about 1 year ago, following which the patient lost her insurance and job. Now is on Medicaid.   HPI- Patient recalls being diagnosed with diabetes in 2006 ( Positive GAD and  Very low Cpep in 2014).  she was taken off Metformin in 2014 and has stayed on basal/bolus regimen since then. Lately patient has been stressed due to her family's medical condition. Her mother is gravely ill.   Patient is currently on basal/bolus regimen with  - Lantus 35 units every morning -Novolog 8/12/8 with meals ( fixed dosing, was previously taking sliding scale).    her  most recent  A1cs were recorded at  Lab Results  Component Value Date   HGBA1C 10.5* 04/12/2014   HGBA1C 9.8* 09/08/2013   HGBA1C 11.4* 12/02/2012   Patient checks her sugars 0  times daily with a  [employer] glucometer. Last checked over 3 weeks ago, as out of test strips. By recall her sugars previously were-  PREMEAL Breakfast Lunch Dinner Bedtime Overall  Glucose range:     120-150  Mean/median:         Hypoglycemia-  Reports feeling that sugars are low prior to lunch time. Hasn't checked sugars recently.  No previous hypoglycemia admission.  Hyperglycemia-  No previous DKA admissions.    Dietary Habits- Eats two- three times daily. Knows Carbohydrate counting, but hasn't been doing it. Generally has low carb options. Limits Sodas/sweetened beverages. Exercise- Lately hasn't been able to do so due to mom's condition.  Is physically active at work cleaning houses.  Weight- Weight has been trending down recently.  Wt Readings from Last 3 Encounters:  04/19/14 163 lb 4 oz (74.05 kg)  04/12/14 164 lb (74.39 kg)  02/19/14  169 lb (76.658 kg)    Diabetes Complications-  Nephropathy-- No  CKD, last BUN/creatinine-GFR 148 Lab Results  Component Value Date   BUN 9 04/12/2014   CREATININE 0.6 04/12/2014   Lab Results  Component Value Date   MICROALBUR 0.8 09/08/2013    Retinopathy- No Last DEE was in the las year by report Neuropathy- Has numbness and tingling in her feet. For the past year has been dealing with foot ulcers, no longer going to wound center due to bad debt, hasn't seen podiatry recently. Takes Lyrica with good symptom control. Reports that itchiness of feet still persist , which leads to foot ulcers.  Associated history - No history of CAD or prior stroke.   Thyroid nodule- Prior thyroid US at [facility] in 2010, showed 1cm x 0.8 x 0.5 cm isthmic nodule. She was supposed to have a follow up US  Last year, however for some reason didn't have one. No FH thyroid cancer or personal hx XRT.  No  hypothyroidism. In 2014, she had low TSH once, however subsequent TSH have been normal.  Review of systems:  [ x ] complains of   [  ] denies  [ x ] weight loss [  ]  tremors [ x ] palpitations [  ] diarrhea  [ x ] heat intolerance [  ] fatigue  [  ] proptosis  [  ] noticing any enlargement in size of thyroid [  ] lumps in neck [  ]  dysphagia  [  ] change in voice    her last TSH was  Lab Results  Component Value Date   TSH 0.97 09/08/2013   TSH 0.45 12/18/2012   TSH 0.32* 12/02/2012    Has  hyperlipidemia. her last set of lipids were- PCP started her on lipitor- but she is not sure whether she should take this medication . She reports multiple family members have not tolerated this medication in the past.  Lab Results  Component Value Date   CHOL 178 09/08/2013   HDL 67.30 09/08/2013   LDLCALC 104* 09/08/2013   LDLDIRECT 104.1 04/12/2014   TRIG 34.0 09/08/2013   CHOLHDL 3 09/08/2013   Lab Results  Component Value Date   AST 11 12/02/2012   ALT 13 12/02/2012   I have reviewed the patient's past medical  history, family and social history, surgical history, medications and allergies.    Past Medical History  Diagnosis Date  . Type 1 diabetes, uncontrolled, with neuropathy   . Mood disorder     anxiety  . Arthritis   . Migraines   . History of chicken pox   . Prurigo nodularis     with diabetic dermopathy   Past Surgical History  Procedure Laterality Date  . Treadmill stress test  01/2013    WNL, low risk study   History   Social History  . Marital Status: Married    Spouse Name: N/A    Number of Children: N/A  . Years of Education: N/A   Occupational History  . Not on file.   Social History Main Topics  . Smoking status: Never Smoker   . Smokeless tobacco: Never Used  . Alcohol Use: Yes     Comment: Occasional wine  . Drug Use: No  . Sexual Activity: Yes    Partners: Male    Birth Control/ Protection: Pill   Other Topics Concern  . Not on file   Social History Narrative   Caffeine use: daily   Occupation: cleans houses   Regular exercise: yes, active 5x/wk   Diet: good water, fruits/vegetables daily   Current Outpatient Prescriptions on File Prior to Visit  Medication Sig Dispense Refill  . aspirin EC 81 MG tablet Take 81 mg by mouth daily.      . cetirizine (ZYRTEC) 10 MG tablet Take 10 mg by mouth daily.      . citalopram (CELEXA) 20 MG tablet Take 1 tablet (20 mg total) by mouth daily.  30 tablet  11  . glucose blood test strip Use as advised to check sugars 3-4x a day  200 each  11  . hydrOXYzine (ATARAX/VISTARIL) 10 MG tablet TAKE ONE TABLET BY MOUTH THREE TIMES DAILY AS NEEDED  30 tablet  0  . insulin aspart (NOVOLOG FLEXPEN) 100 UNIT/ML FlexPen INJECT INTO SKIN 8 UNITS IN THE MORNING, 12 UNITS AT LUNCH AND 7 UNITS AT DINNER. + SSI      . Insulin Pen Needle (PEN NEEDLES 3/16") 31G X 5 MM MISC 1 each by Does not apply route 4 (four) times daily.  200 each  11  . LANTUS SOLOSTAR 100 UNIT/ML Solostar Pen INJECT 35 UNITS into THE SKIN EVERY MORNING  15 mL  3   . LORazepam (ATIVAN) 0.5 MG tablet TAKE ONE TABLET BY MOUTH TWICE DAILY AS NEEDED  60 tablet  0  . norethindrone-ethinyl estradiol (MICROGESTIN,JUNEL,LOESTRIN) 1-20 MG-MCG tablet Take 1 tablet by mouth daily.  1 Package  11  . pregabalin (  LYRICA) 100 MG capsule TAKE ONE CAPSULE BY MOUTH TWICE DAILY  60 capsule  3  . atorvastatin (LIPITOR) 10 MG tablet Take 1 tablet (10 mg total) by mouth daily.  30 tablet  6  . glucagon (GLUCAGON EMERGENCY) 1 MG injection Inject 1 mg into the muscle once as needed.  2 each  prn   No current facility-administered medications on file prior to visit.   Allergies  Allergen Reactions  . Gabapentin Other (See Comments)    Dizziness   Family History  Problem Relation Age of Onset  . Diabetes Mother     type 2  . Hypertension Mother   . Thyroid disease Mother   . Bipolar disorder Mother   . Heart disease Mother   . Calcium disorder Mother   . Cancer Maternal Grandmother     Breast, stomach  . Cancer Paternal Grandmother     stomach, lung (smoker)  . Diabetes Paternal Grandmother   . Diabetes Paternal Grandfather   . CAD Sister     stent  . Stroke Maternal Aunt   . Cancer Maternal Uncle     prostate  . CAD Maternal Aunt     stents  . Cancer Maternal Aunt 64    ovarian     REVIEW OF SYSTEMS- Review of Systems: $RemoveBef'[x]'cGFsUxxPwc$  complains of  [  ] denies General:   [  ] Recent weight change [  ] Fatigue  [  ] Loss of appetite Eyes: [x  ]  Vision Difficulty [ x ]  Eye pain ENT: [  ]  Hearing difficulty [  ]  Difficulty Swallowing [facility]: [  ] Chest pain [  ]  Palpitations/Irregular Heart beat [  ]  Shortness of breath lying flat [ x ] Swelling of legs Resp: [  ] Frequent Cough [  ] Shortness of Breath  [  ]  Wheezing GI: [  ] Heartburn  [  ] Nausea or Vomiting  [  ] Diarrhea [  ] Constipation  [  ] Abdominal Pain GU: [  ]  Polyuria  [  ]  nocturia Bones/joints:  [x  ]  Muscle aches  [x  ] Joint Pain  [  ] Bone pain Skin/Hair/Nails: [  ]  Rash  [  ] New stretch  marks [ x ]  Itching [  ] Hair loss [  ]  Excessive hair growth Reproduction: [  ] Low sexual desire , [x  ]  Women: Menstrual cycle problems [  ]  Women: Breast Discharge [  ] Men: Difficulty with erections [  ]  Men: Enlarged Breasts CNS: [  ] Frequent Headaches [ x ] Blurry vision [  ] Tremors [  ] Seizures [  ] Loss of consciousness [  ] Localized weakness Endocrine: [  ]  Excess thirst [  x]  Feeling excessively hot [  ]  Feeling excessively cold Heme: [  ]  Easy bruising [  ]  Enlarged glands or lumps in neck Allergy: [x  ]  Food allergies [ x ] Environmental allergies  PHYSICAL EXAM- BP 108/70  Pulse 82  Ht 6' (1.829 m)  Wt 163 lb 4 oz (74.05 kg)  BMI 22.14 kg/m2  SpO2 97%  LMP 04/06/2014 Wt Readings from Last 3 Encounters:  04/19/14 163 lb 4 oz (74.05 kg)  04/12/14 164 lb (74.39 kg)  02/19/14 169 lb (76.658 kg)   GENERAL: No acute distress HEENT:  Eye exam shows  normal external appearance. Oral exam shows normal mucosa .  NECK:   Neck exam shows no lymphadenopathy. No Carotids bruits. Thyroid is  Slightly enlarged and no nodules felt.   LUNGS:         Chest is symmetrical. Lungs are clear to auscultation.Marland Kitchen   HEART:         Heart sounds:  S1 and S2 are normal. No murmurs or clicks heard. ABDOMEN:  No Distention present. Liver and spleen are not palpable. No other mass or tenderness present.  EXTREMITIES:     There is no edema. Pigmented skin lesions present over lower extremities B/L. 2+ DP.Marland Kitchen  NEUROLOGICAL:     Grossly intact. 2+ reflexes at biceps bilaterally.             MUSCULOSKELETAL:       There is no enlargement or deformity of the joints.  SKIN:       2 shallow ulcers over left medial aspect of foot , healing with evidence of infection.  Right lateral healing ulcer with clear min discharge  . DFE recently done by PCP.      ASSESSMENT/PLAN- 1. Type 1 DM, uncontrolled 2. DM foot ulcers 3. Thyroid nodule  Problem List Items Addressed This Visit     Endocrine    Type 1 diabetes mellitus with diabetic neuropathy - Primary (Chronic)     Recent A1c uncontrolled. Encouraged her to check sugars 3-4 times daily. Discussed importance of checking sugars. Currently , I have no data on her sugars to make adjustments to her current regimen. Due to reported hypoglycemia recently, will decrease lantus to 33 units daily and change Novolog to 6/12/8 qac.  Discussed hypoglycemia recognition and treatment protocol.  Continue regular self foot care and lyrica for neuropathy.  She will start the low dose lipitor and report back if does not tolerate.     Relevant Orders      Ambulatory referral to Podiatry     Other   H/O thyroid nodule     Discussed about thyroid function and thyroid nodules. Check TSH at next visits. Recent TSH about 7 months ago was normal.  Recommend follow up thyroid US this visit to assess size stability of thyroid nodules. She is busy with her mom's medical condition now, but is willing to schedule appt in about 1 month.  Have ordered thyroid US at [facility], where she had it done last in 2010.     Relevant Orders      US Soft Tissue Head/Neck   Diabetic foot ulcers     She has non healing ulcers on her bilateral feet. Encouraged follow back with podiatry and PCP. She was instructed to wear close toe shoes particularly when home cleaning. She may benefit from getting fitted for DM shoes.     Relevant Orders      Ambulatory referral to Podiatry      25 minutes spent with the patient, > 50% time spent on discussion of topics mentioned above and counselling.  RTC 2 weeks  Luwanda Starr [facility] 04/19/2014, 3:45 PM

## 2014-05-01 ENCOUNTER — Other Ambulatory Visit: Payer: Self-pay | Admitting: Family Medicine

## 2014-05-02 ENCOUNTER — Ambulatory Visit: Payer: Medicaid Other | Admitting: Family Medicine

## 2014-05-04 ENCOUNTER — Ambulatory Visit: Payer: Medicaid Other | Admitting: Endocrinology

## 2014-05-06 ENCOUNTER — Ambulatory Visit (INDEPENDENT_AMBULATORY_CARE_PROVIDER_SITE_OTHER): Payer: Medicaid Other | Admitting: Family Medicine

## 2014-05-06 ENCOUNTER — Encounter: Payer: Self-pay | Admitting: Family Medicine

## 2014-05-06 VITALS — BP 156/100 | HR 90 | Temp 97.8°F | Wt 160.5 lb

## 2014-05-06 DIAGNOSIS — Z634 Disappearance and death of family member: Secondary | ICD-10-CM

## 2014-05-06 DIAGNOSIS — F4321 Adjustment disorder with depressed mood: Secondary | ICD-10-CM

## 2014-05-06 DIAGNOSIS — F4329 Adjustment disorder with other symptoms: Secondary | ICD-10-CM | POA: Insufficient documentation

## 2014-05-06 DIAGNOSIS — F39 Unspecified mood [affective] disorder: Secondary | ICD-10-CM

## 2014-05-06 MED ORDER — CLONAZEPAM 0.5 MG PO TABS: 0.5000 mg | ORAL_TABLET | Freq: Every day | ORAL | Status: DC

## 2014-05-06 MED ORDER — HYDROXYZINE HCL 10 MG PO TABS: ORAL_TABLET | ORAL | Status: DC

## 2014-05-06 NOTE — Patient Instructions (Signed)
Continue celexa $RemoveBeforeD'20mg'wmhfTrXpZjRmZr$  daily. Start klonopin 0.$RemoveBeforeDE'5mg'BWNmXmeEfxcIwLh$  nightly for sleep and anxiety. Remember to taper off and not stop suddenly as that can be dangerous. Please call me with update on effect of klonopin in 1 week, and again 1 week prior to running out of med.

## 2014-05-06 NOTE — Assessment & Plan Note (Signed)
celexa helping but currently overwhelmed with significant stressors. Continue celexa $RemoveBeforeD'20mg'QlGkfGrmTcyDSy$  daily, add klonopin 0.$RemoveBefore'5mg'EgxBdWJJLLLId$  nightly for sleep and anxiety. Extensively discussed concerns with benzo use esp in setting of previous benzo withdrawal seizure, but I feel that benefits of med under current stressful situation outweight risks. Pt educated extensively on need to taper slowly off med and not stop suddenly. Pt agrees.

## 2014-05-06 NOTE — Progress Notes (Signed)
BP 170/100  Pulse 90  Temp(Src) 97.8 F (36.6 C) (Oral)  Wt 160 lb 8 oz (72.802 kg)  SpO2 97%  LMP 04/25/2014   CC: f/u visit, discuss stress  Subjective:    Patient ID: [name], female    DOB: 01-07-1971, 43 y.o.   MRN: [id number]  HPI: [name] is a 43 y.o. female presenting on 05/06/2014 for Follow-up and Stress   Recently insurance was changed to [employer].  See prior note for details. Did recently have benzo withdrawal seizure after she suddenly stopped medication. Increased stress with family situations - son sick with upcoming GI surgery in between pediatric GI doctors, mother recently passed away May 12, 2014 with CJD - cared for at [facility], husband recently lost his job, pt had insurance changed. Last visit, zoloft was changed to celexa $RemoveB'20mg'gPDlQtbJ$  daily. We decided to taper off lorazepam. Staying very stressed with all above situations.   Foot ulcers - with itching. Has been unable to see dermatologist or podiatrist 2/2 new insurance (medicaid). 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  Relevant past medical, surgical, family and social history reviewed and updated as indicated.  Allergies and medications reviewed and updated. Current Outpatient Prescriptions on File Prior to Visit  Medication Sig  . aspirin EC 81 MG tablet Take 81 mg by mouth daily.  Marland Kitchen atorvastatin (LIPITOR) 10 MG tablet Take 1 tablet (10 mg total) by mouth daily.  . cetirizine (ZYRTEC) 10 MG tablet Take 10 mg by mouth daily.  . citalopram (CELEXA) 20 MG tablet Take 1 tablet (20 mg total) by mouth daily.  Marland Kitchen glucagon (GLUCAGON EMERGENCY) 1 MG injection Inject 1 mg into the muscle once as needed.  Marland Kitchen glucose blood test strip Use as advised to check sugars 3-4x a day  . insulin aspart (NOVOLOG FLEXPEN) 100 UNIT/ML FlexPen INJECT INTO SKIN 8 UNITS IN THE MORNING, 12 UNITS AT LUNCH AND 7 UNITS AT DINNER. + SSI  .  Insulin Pen Needle (PEN NEEDLES 3/16") 31G X 5 MM MISC 1 each by Does not apply route 4 (four) times daily.  Marland Kitchen LANTUS SOLOSTAR 100 UNIT/ML Solostar Pen INJECT 35 UNITS into THE SKIN EVERY MORNING  . norethindrone-ethinyl estradiol (MICROGESTIN,JUNEL,LOESTRIN) 1-20 MG-MCG tablet Take 1 tablet by mouth daily.  . pregabalin (LYRICA) 100 MG capsule TAKE ONE CAPSULE BY MOUTH TWICE DAILY   No current facility-administered medications on file prior to visit.    Review of Systems Per HPI unless specifically indicated above    Objective:    BP 170/100  Pulse 90  Temp(Src) 97.8 F (36.6 C) (Oral)  Wt 160 lb 8 oz (72.802 kg)  SpO2 97%  LMP 04/25/2014  Physical Exam  Nursing note and vitals reviewed. Constitutional: She appears well-developed and well-nourished.  Psychiatric:  Tearful, anxious over current stressors.   Results for orders placed in visit on 04/12/14  LDL CHOLESTEROL, DIRECT      Result Value Ref Range   Direct LDL 104.1    CBC WITH DIFFERENTIAL      Result Value Ref Range   WBC 5.3  4.0 - 10.5 K/uL   RBC 4.27  3.87 - 5.11 Mil/uL   Hemoglobin 12.6  12.0 - 15.0 g/dL   HCT 37.6  36.0 - 46.0 %   MCV 88.0  78.0 - 100.0 fl   MCHC 33.4  30.0 - 36.0 g/dL   RDW 13.4  11.5 - 15.5 %   Platelets 257.0  150.0 - 400.0 K/uL   Neutrophils Relative % 62.9  43.0 - 77.0 %   Lymphocytes Relative 32.6  12.0 - 46.0 %   Monocytes Relative 3.1  3.0 - 12.0 %   Eosinophils Relative 0.8  0.0 - 5.0 %   Basophils Relative 0.6  0.0 - 3.0 %   Neutro Abs 3.3  1.4 - 7.7 K/uL   Lymphs Abs 1.7  0.7 - 4.0 K/uL   Monocytes Absolute 0.2  0.1 - 1.0 K/uL   Eosinophils Absolute 0.0  0.0 - 0.7 K/uL   Basophils Absolute 0.0  0.0 - 0.1 K/uL  BASIC METABOLIC PANEL      Result Value Ref Range   Sodium 135  135 - 145 mEq/L   Potassium 4.0  3.5 - 5.1 mEq/L   Chloride 101  96 - 112 mEq/L   CO2 28  19 - 32 mEq/L   Glucose, Bld 198 (*) 70 - 99 mg/dL   BUN 9  6 - 23 mg/dL   Creatinine, Ser 0.6  0.4 - 1.2  mg/dL   Calcium 9.0  8.4 - 10.5 mg/dL   GFR 148.38  >60.00 mL/min  HEMOGLOBIN A1C      Result Value Ref Range   Hemoglobin A1C 10.5 (*) 4.6 - 6.5 %      Assessment & Plan:   Problem List Items Addressed This Visit   Mood disorder     celexa helping but currently overwhelmed with significant stressors. Continue celexa $RemoveBeforeD'20mg'IPDCNPzHzuIQLD$  daily, add klonopin 0.$RemoveBefore'5mg'ikbllPusBkXwc$  nightly for sleep and anxiety. Extensively discussed concerns with benzo use esp in setting of previous benzo withdrawal seizure, but I feel that benefits of med under current stressful situation outweight risks. Pt educated extensively on need to taper slowly off med and not stop suddenly. Pt agrees.    Complicated grieving - Primary     See HPI. Suddenly lost mother to CJD. Discussed normal grieving process as well as her current complicated grieving. See above.        Follow up plan: Return if symptoms worsen or fail to improve.

## 2014-05-06 NOTE — Progress Notes (Signed)
Pre visit review using our clinic review tool, if applicable. No additional management support is needed unless otherwise documented below in the visit note. 

## 2014-05-06 NOTE — Assessment & Plan Note (Signed)
See HPI. Suddenly lost mother to CJD. Discussed normal grieving process as well as her current complicated grieving. See above.

## 2014-05-09 ENCOUNTER — Other Ambulatory Visit: Payer: Self-pay | Admitting: Family Medicine

## 2014-05-19 ENCOUNTER — Ambulatory Visit: Payer: Medicaid Other | Admitting: Endocrinology

## 2014-05-23 ENCOUNTER — Other Ambulatory Visit: Payer: Self-pay | Admitting: Family Medicine

## 2014-06-06 ENCOUNTER — Encounter: Payer: Self-pay | Admitting: Family Medicine

## 2014-06-11 ENCOUNTER — Telehealth: Payer: Self-pay | Admitting: Family Medicine

## 2014-06-13 NOTE — Telephone Encounter (Signed)
[name] from [place] left v/m; pt has changed from [place] to [facility]. Medicaid only approves accuchek nano meter with accuchek smartview test strips and accuchek fast clix lancets. Please advise.

## 2014-06-15 MED ORDER — GLUCOSE BLOOD VI STRP: ORAL_STRIP | Status: DC

## 2014-06-15 MED ORDER — ACCU-CHEK SOFT TOUCH LANCETS MISC: Status: DC

## 2014-06-15 MED ORDER — BLOOD GLUCOSE METER KIT: PACK | Status: DC

## 2014-06-15 NOTE — Telephone Encounter (Signed)
refilled 

## 2014-06-15 NOTE — Addendum Note (Signed)
Addended by: Ria Bush on: 06/15/2014 11:26 PM   Modules accepted: Orders

## 2014-07-08 ENCOUNTER — Other Ambulatory Visit: Payer: Self-pay | Admitting: Nurse Practitioner

## 2014-07-08 DIAGNOSIS — N6459 Other signs and symptoms in breast: Secondary | ICD-10-CM

## 2014-07-11 ENCOUNTER — Other Ambulatory Visit: Payer: Self-pay | Admitting: Family Medicine

## 2014-07-11 NOTE — Telephone Encounter (Deleted)
Ok to refill 

## 2014-07-13 ENCOUNTER — Other Ambulatory Visit: Payer: Self-pay | Admitting: Nurse Practitioner

## 2014-07-13 DIAGNOSIS — N6459 Other signs and symptoms in breast: Secondary | ICD-10-CM

## 2014-07-22 ENCOUNTER — Ambulatory Visit
Admission: RE | Admit: 2014-07-22 | Discharge: 2014-07-22 | Disposition: A | Discharge status: Home / Self Care | Payer: Medicaid Other | Source: Ambulatory Visit | Attending: Nurse Practitioner | Admitting: Nurse Practitioner

## 2014-07-22 DIAGNOSIS — N6459 Other signs and symptoms in breast: Secondary | ICD-10-CM

## 2014-07-25 ENCOUNTER — Other Ambulatory Visit: Payer: Self-pay | Admitting: Internal Medicine

## 2014-08-11 ENCOUNTER — Encounter (HOSPITAL_BASED_OUTPATIENT_CLINIC_OR_DEPARTMENT_OTHER): Payer: Medicaid Other | Attending: Internal Medicine

## 2014-12-05 ENCOUNTER — Encounter: Payer: Medicaid Other | Attending: Surgery | Admitting: Surgery

## 2014-12-05 DIAGNOSIS — E10610 Type 1 diabetes mellitus with diabetic neuropathic arthropathy: Secondary | ICD-10-CM | POA: Diagnosis not present

## 2014-12-05 DIAGNOSIS — I10 Essential (primary) hypertension: Secondary | ICD-10-CM | POA: Diagnosis not present

## 2014-12-05 DIAGNOSIS — L97821 Non-pressure chronic ulcer of other part of left lower leg limited to breakdown of skin: Secondary | ICD-10-CM | POA: Insufficient documentation

## 2014-12-05 DIAGNOSIS — L97811 Non-pressure chronic ulcer of other part of right lower leg limited to breakdown of skin: Secondary | ICD-10-CM | POA: Insufficient documentation

## 2014-12-05 DIAGNOSIS — E11621 Type 2 diabetes mellitus with foot ulcer: Secondary | ICD-10-CM | POA: Diagnosis present

## 2014-12-05 DIAGNOSIS — Z794 Long term (current) use of insulin: Secondary | ICD-10-CM | POA: Insufficient documentation

## 2014-12-06 NOTE — Progress Notes (Signed)
[name], [name] [id number]) Visit Report for 12/05/2014 Chief Complaint Document Details [name], 12/05/2014 1:15 Patient Name: Date of Service: [name] PM Medical Record Patient Account Number: [id number] Number: Treating RN: [name] Date of Birth/Sex: Dec 22, 1970 (44 y.o. Female) Other Clinician: Primary Care Treating [name], [name] Collins: Collins/Extender: Referring Collins: Megan Lofts Collins in Treatment: 0 Information Obtained from: Patient Chief Complaint Patient returns to the wound care Collins for reopened ulcer to: both lower extremities. She has had this for the last 3 months. Electronic Signature(s) Signed: 12/05/2014 4:43:42 PM By: Megan Fudge MD, [name] Entered By: [name] on 12/05/2014 14:32:55 [name], [place] [phone]) -------------------------------------------------------------------------------- HPI Details [name], 12/05/2014 1:15 Patient Name: Date of Service: [name] PM Medical Record Patient Account Number: [id number] Number: Treating RN: [name] Date of Birth/Sex: [birth year] (44 y.o. Female) Other Clinician: Primary Care Treating [name], [name]: Collins/Extender: Referring Collins: Megan Lofts Collins in Treatment: 0 History of Present Illness Location: both lower extremities Quality: Patient reports No Pain. however she has intense itching sensation. Severity: Patient states wound (s) are getting better. Duration: Patient states that they are not certain how long the wound has been present. Timing: Pain in wound is Intermittent (comes and goes) but the sensation which is most prominent is a severe itch. Context: The wound appeared gradually over time Modifying Factors: Other treatment(s) tried include: multiple courses of antibiotics by her PCP. HPI Description: this 44 year old patient who is known to have diabetes mellitus type 1  and also has diabetic neuropathy comes with a history of having recurrent ulcerations to both lower extremities on and off for the last 3 months. She was seen here in the months of October and November and had completely healed. She sees a new PCP and because of this she was not certain whether she has had diabetes mellitus type 2 treatment or her diabetes mellitus has been controlled appropriately. She also has not seen a dermatologist yet. she does not see a psychiatric disorder behavioral specialist. The patient does admit that she scratches her lower extremities a lot and she is aware that she is scratching so much as she is damaging her skin and causing it to bleed. However she cannot help herself. Electronic Signature(s) Signed: 12/05/2014 4:43:42 PM By: Megan Fudge MD, [name] Entered By: [name] on 12/05/2014 14:42:38 [name], [name] [phone]) -------------------------------------------------------------------------------- Physical Exam Details [name], 12/05/2014 1:15 Patient Name: Date of Service: [name] PM Medical Record Patient Account Number: [id number] Number: Treating RN: [name] Date of Birth/Sex: [birth year] (44 y.o. Female) Other Clinician: Primary Care Treating [name], [name] Collins: Collins/Extender: Referring Collins: Megan Lofts Collins in Treatment: 0 Constitutional . Pulse regular. Respirations normal and unlabored. Afebrile. . Eyes Nonicteric. Reactive to light. Ears, Nose, Mouth, and Throat Lips, teeth, and gums WNL.Marland Kitchen Moist mucosa without lesions . Neck supple and nontender. No palpable supraclavicular or cervical adenopathy. Normal sized without goiter. Respiratory WNL. No retractions.. Cardiovascular Pedal Pulses WNL. ABI is 1.24 on the right and 1.3 on the left.. No clubbing, cyanosis or edema. Integumentary (Hair, Skin) She has no open wounds on both lower extremities but has multiple  stigmata of scratching and has several areas of scar tissue and ulcers of the skin in various stages of healing but none of them open.Marland Kitchen Psychiatric Judgement and insight Intact.. No evidence of depression, anxiety, or agitation.. Electronic Signature(s) Signed: 12/05/2014 4:43:42 PM By: Megan Fudge MD, [name] Entered By: [name] on 12/05/2014 14:44:18  [name], [name] [phone]) -------------------------------------------------------------------------------- Collins Orders Details [name], 12/05/2014 1:15 Patient Name: Date of Service: [name] PM Medical Record Patient Account Number: [id number] Number: Treating RN: [name] Date of Birth/Sex: 1970-09-30 (44 y.o. 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 [facility] o Discharge from [facility] - consulted with patient to consult with PCP for dermatology and psychiatric services Electronic Signature(s) Signed: 12/05/2014 4:43:42 PM By: Megan Fudge MD, [name] Signed: 12/05/2014 5:25:40 PM By: [name] Entered By: [name] on 12/05/2014 [name], [place] [phone]) -------------------------------------------------------------------------------- Problem List Details [name], 12/05/2014 1:15 Patient Name: Date of Service: [facility] PM Medical Record Patient Account Number: [id number] Number: Treating RN: [name] Date of Birth/Sex: 1971-04-25 (44 y.o. Female) Other  Clinician: Primary Care Treating [name], Sudais Banghart Collins: Collins/Extender: Referring Collins: [name] in Treatment: 0 Active Problems ICD-10 Encounter Code Description Active Date Diagnosis E10.621 Type 1 diabetes mellitus with foot ulcer 12/05/2014 Yes E10.610 Type 1 diabetes mellitus with diabetic neuropathic 12/05/2014 Yes arthropathy L97.821 Non-pressure chronic ulcer of other part of left lower leg 12/05/2014 Yes limited to breakdown of skin L97.811 Non-pressure chronic ulcer of other part of right lower leg 12/05/2014 Yes limited to breakdown of skin Inactive Problems Resolved Problems Electronic Signature(s) Signed: 12/05/2014 4:43:42 PM By: Megan Fudge MD, [name] Entered By: [name] on 12/05/2014 14:32:21 [name], [name] [phone]) -------------------------------------------------------------------------------- Progress Note Details [name], 12/05/2014 1:15 Patient Name: Date of Service: [facility] PM Medical Record Patient Account Number: [id number] Number: Treating RN: [name] Date of Birth/Sex: [birth year] (44 y.o. Female) Other Clinician: Primary Care Treating [name], Javell Blackburn Collins: Collins/Extender: Referring Collins: [name] in Treatment: 0 Subjective Chief Complaint Information obtained from Patient Patient returns to the wound care Collins for reopened ulcer to: both lower extremities. She has had this for the last 3 months. History of Present Illness (HPI) The following HPI elements were documented for the patient's wound: Location: both lower extremities Quality: Patient reports No Pain. however she has intense itching sensation. Severity: Patient states wound (s) are getting better. Duration: Patient states that they are not certain how long the wound has been present. Timing: Pain in wound is Intermittent (comes and goes) but the sensation which is most  prominent is a severe itch. Context: The wound appeared gradually over time Modifying Factors: Other treatment(s) tried include: multiple courses of antibiotics by her PCP. this 44 year old patient who is known to have diabetes mellitus type 1 and also has diabetic neuropathy comes with a history of having recurrent ulcerations to both lower extremities on and off for the last 3 months. She was seen here in the months of October and November and had completely healed. She sees a new PCP and because of this she was not certain whether she has had diabetes mellitus type 2 treatment or her diabetes mellitus has been controlled appropriately. She also has not seen a dermatologist yet. she does not see a psychiatric disorder behavioral specialist. The patient does admit that she scratches her lower extremities a lot and she is aware that she is  scratching so much as she is damaging her skin and causing it to bleed. However she cannot help herself. Wound History Patient presents with 3 open wounds that have been present for approximately january. Patient has been treating wounds in the following manner: soak in water, vics vapor rub and vaseline . The wounds have been healed in the past but have re-opened. Laboratory tests have not been performed in the last month. Patient reportedly has not tested positive for an antibiotic resistant organism. Patient reportedly has not tested positive for osteomyelitis. Patient reportedly has had testing performed to evaluate circulation in the legs. Patient History Information obtained from Patient. [name], [name] [phone]) Allergies gabapentin (Severity: Moderate, Reaction: non responsive) Family History Cancer - Paternal Grandparents, Diabetes - Mother, Maternal Grandparents, Paternal Grandparents, Thyroid Problems - Mother, No family history of Heart Disease, Hereditary Spherocytosis, Hypertension, Kidney Disease, Lung Disease, Seizures, Stroke,  Tuberculosis. Social History Never smoker, Marital Status - Married, Alcohol Use - Rarely, Drug Use - No History, Caffeine Use - Daily. Medical History Cardiovascular Patient has history of Hypertension Endocrine Patient has history of Type II Diabetes Patient is treated with Insulin. Blood sugar is tested. Blood sugar results noted at the following times: Lunch - 257. Medications Glucagon Emergency Kit (human-recomb) 1 mg injection injection kit injection aspirin 81 mg tablet,delayed release oral tablet,delayed release (DR/EC) oral lorazepam 0.5 mg tablet oral tablet oral Lyrica 200 mg capsule oral capsule oral hydroxyzine HCl 10 mg tablet oral tablet oral cetirizine 10 mg tablet oral tablet oral Lantus Solostar 100 unit/mL (3 mL) subcutaneous insulin pen subcutaneous insulin pen subcutaneous Novolog Flexpen 100 unit/mL subcutaneous subcutaneous insulin pen subcutaneous duloxetine 60 mg capsule,delayed release oral capsule,delayed release(DR/EC) oral Objective Constitutional Pulse regular. Respirations normal and unlabored. Afebrile. Vitals Time Taken: 1:20 PM, Height: 72 in, Source: Stated, Weight: 160 lbs, Source: Stated, BMI: 21.7, [name], [name] [phone]) Temperature: 98.5 F, Pulse: 100 bpm, Respiratory Rate: 16 breaths/min, Blood Pressure: 132/78 mmHg. Eyes Nonicteric. Reactive to light. Ears, Nose, Mouth, and Throat Lips, teeth, and gums WNL.Marland Kitchen Moist mucosa without lesions . Neck supple and nontender. No palpable supraclavicular or cervical adenopathy. Normal sized without goiter. Respiratory WNL. No retractions.. Cardiovascular Pedal Pulses WNL. ABI is 1.24 on the right and 1.3 on the left.. No clubbing, cyanosis or edema. Psychiatric Judgement and insight Intact.. No evidence of depression, anxiety, or agitation.. Integumentary (Hair, Skin) She has no open wounds on both lower extremities but has multiple stigmata of scratching and has several areas of scar  tissue and ulcers of the skin in various stages of healing but none of them open.. She has no open wounds on both lower extremities but has multiple stigmata of scratching and has several areas of scar tissue and ulcers of the skin in various stages of healing but none of them open. 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 She is aware that she is doing it but cannot help herself and I believe this is a behavioral problem which needs to be addressed by either a  psychologist or a behavioral specialist. She also needs to see a dermatologist and her PCP will try and get hold of an appropriate referral. At this stage I have asked her to use some over-the-counter anti-itching cream which has some mild corticosteroid. she also needs to moisturize both lower extremities as much as possible. I have recommended wearing hand mittens or gloves at night so as to prevent herself from scratching and asleep. At this stage there are no open wounds to treat and hence I have given her symptomatic treatment and asked her to see her PCP for the appropriate references. Plan Discharge From [facility]: Discharge from [facility] - consulted with patient to consult with PCP for dermatology and psychiatric services At this stage I have asked her to use some over-the-counter anti-itching cream which has some mild corticosteroid. she also needs to moisturize both lower extremities as much as possible. I have recommended  wearing hand mittens or gloves at night so as to prevent herself from scratching and asleep. At this stage there are no open wounds to treat and hence I have given her symptomatic treatment and asked her to see her PCP for the appropriate references. she will come back and see [place] on a hard as-needed basis. Electronic Signature(s) Signed: 12/05/2014 4:43:42 PM By: Megan Fudge MD, [name] Entered By: [name] on 12/05/2014 14:47:29 [place] [phone]) -------------------------------------------------------------------------------- ROS/PFSH Details [name], 12/05/2014 1:15 Patient Name: Date of Service: [facility] PM Medical Record Patient Account Number: [id number] Number: Treating RN: [name] Date of Birth/Sex: 1971-06-24 (44 y.o. Female) Other Clinician: Primary Care Treating [name], [place] Collins: Collins/Extender: Referring Collins: Megan Lofts Collins in Treatment: 0 Information Obtained From Patient Wound History Do you currently have one or more open woundso Yes How many open wounds do you currently haveo 3 Approximately how long have you had your woundso january How have you been treating your wound(s) until nowo soak in water, vics vapor rub and vaseline Has your wound(s) ever healed and then re-openedo Yes Have you had any lab work done in the past montho No Have you tested positive for an antibiotic resistant organism No (MRSA, VRE)o Have you tested positive for osteomyelitis (bone infection)o No Have you had any tests for circulation on your legso Yes Cardiovascular Medical History: Positive for: Hypertension Endocrine Medical History: Positive for: Type II Diabetes Time with diabetes: 18 years Treated with: Insulin Blood sugar tested every day: Yes Tested : three times daily Blood sugar testing results: Lunch: 257 Neurologic Medical History: Positive for: Neuropathy Family and Social  History [name], [name] [phone]) Cancer: Yes - Paternal Grandparents; Diabetes: Yes - Mother, Maternal Grandparents, Paternal Grandparents; Heart Disease: No; Hereditary Spherocytosis: No; Hypertension: No; Kidney Disease: No; Lung Disease: No; Seizures: No; Stroke: No; Thyroid Problems: Yes - Mother; Tuberculosis: No; Never smoker; Marital Status - Married; Alcohol Use: Rarely; Drug Use: No History; Caffeine Use: Daily; Financial Concerns: No; Food, Clothing or Shelter Needs: No; Support System Lacking: No; Transportation Concerns: No; Advanced Directives: No; Patient does not want information on Advanced Directives; Do not resuscitate: No; Living Will: No; Medical Power of Attorney: No [facility] Affirmation I have reviewed and agree with the above information. Electronic Signature(s) Signed: 12/05/2014 4:43:42 PM By: Megan Fudge MD, [name] Signed: 12/05/2014 6:18:01 PM By: Gretta Cool RN, BSN, [name] RN, BSN Entered By: Gretta Cool, RN, BSN, [name] on 12/05/2014 13:52:14

## 2014-12-06 NOTE — Progress Notes (Signed)
[name], KEYS [phone]) Visit Report for 12/05/2014 Allergy List Details [name], 12/05/2014 1:15 Patient Name: Date of Service: [name] Medical Record Patient Account Number: [id number] Number: Treating [name]: [name] Date of Birth/Sex: June 25, 1971 (44 y.o. Female) Other Clinician: Primary Care Physician: [name] Treating [name] Referring Physician: [name] Physician/Extender: [name] in Treatment: 0 Allergies Active Allergies gabapentin Reaction: non responsive Severity: Moderate Allergy Notes Electronic Signature(s) Signed: 12/05/2014 6:18:01 Collins By: Megan Cool, [name], [name], [name] [name], [name] Entered By: Megan Cool, [name], [name], [name] on 12/05/2014 13:49:04 [name], [name] [phone]) -------------------------------------------------------------------------------- Arrival Information Details [name], 12/05/2014 1:15 Patient Name: Date of Service: [facility] Collins Medical Record Patient Account Number: [id number] Number: Treating [name]: [name] Date of Birth/Sex: [birth year] (44 y.o. Female) Other Clinician: Primary Care Physician: [name] Treating [name] Referring Physician: [name] Physician/Extender: [name] in Treatment: 0 Visit Information Patient Arrived: Ambulatory Arrival Time: 13:19 Accompanied By: self Transfer Assistance: None Patient Identification Verified: Yes Secondary Verification Process Yes Completed: Patient Has Alerts: Yes Patient Alerts: Type II Diabetic 12/05/2014 ABI: (R) 1.24 (L) 1.3 History Since Last Visit Electronic Signature(s) Signed: 12/05/2014 6:18:01 Collins By: Megan Cool, [name], [name], [name] [name], [name] Entered By: Megan Cool, [name], [name], [name] on 12/05/2014 13:37:03 [name] [phone]) -------------------------------------------------------------------------------- Clinic Level of Care Assessment Details [name], 12/05/2014 1:15 Patient Name: Date of Service: [facility]  Collins Medical Record Patient Account Number: [id number] Number: Treating [name]: [name] Date of Birth/Sex: [birth year] (44 y.o. Female) Other Clinician: Primary Care Physician: [name] Treating [name] Referring Physician: [name] Physician/Extender: [name] in Treatment: 0 Clinic Level of Care Assessment Items TOOL 4 Quantity Score []  - Use when only an EandM is performed on FOLLOW-UP visit 0 ASSESSMENTS - Nursing Assessment / Reassessment X - Reassessment of Co-morbidities (includes updates in patient status) 1 10 X - Reassessment of Adherence to Treatment Plan 1 5 ASSESSMENTS - Wound and Skin Assessment / Reassessment []  - Simple Wound Assessment / Reassessment - one wound 0 []  - Complex Wound Assessment / Reassessment - multiple wounds 0 X - Dermatologic / Skin Assessment (not related to wound area) 1 10 ASSESSMENTS - Focused Assessment X - Circumferential Edema Measurements - multi extremities 1 5 []  - Nutritional Assessment / Counseling / Intervention 0 []  - Lower Extremity Assessment (monofilament, tuning fork, pulses) 0 X - Peripheral Arterial Disease Assessment (using hand held doppler) 1 10 ASSESSMENTS - Ostomy and/or Continence Assessment and Care []  - Incontinence Assessment and Management 0 []  - Ostomy Care Assessment and Management (repouching, etc.) 0 PROCESS - Coordination of Care X - Simple Patient / Family Education for ongoing care 1 15 []  - Complex (extensive) Patient / Family Education for ongoing care 0 X - Staff obtains Programmer, systems, Records, Test Results / Process Orders 1 10 []  - Staff telephones HHA, Nursing Homes / Clarify orders / etc 0 [name] [name], [name] []  - Routine Transfer to another Facility (non-emergent condition) 0 []  - Routine Hospital Admission (non-emergent condition) 0 X - New Admissions / Biomedical engineer / Ordering NPWT, Apligraf, etc. 1 15 []  - Emergency Hospital Admission (emergent  condition) 0 X - Simple Discharge Coordination 1 10 []  - Complex (extensive) Discharge Coordination 0 PROCESS - Special Needs []  - Pediatric / Minor Patient Management 0 []  - Isolation Patient Management 0 []  - Hearing / Language / Visual special needs 0 []  - Assessment of Community assistance (transportation, D/C planning, etc.) 0 []  - Additional assistance / Altered mentation 0 []  -  Support Surface(s) Assessment (bed, cushion, seat, etc.) 0 INTERVENTIONS - Wound Cleansing / Measurement $RemoveBefor'[]'RQCBbdhJOhqB$  - Simple Wound Cleansing - one wound 0 $Remov'[]'mQIkbx$  - Complex Wound Cleansing - multiple wounds 0 X - Wound Imaging (photographs - any number of wounds) 1 5 $R'[]'Yd$  - Wound Tracing (instead of photographs) 0 $RemoveBefore'[]'WDmQPArcmoTHg$  - Simple Wound Measurement - one wound 0 $Remov'[]'gsecgk$  - Complex Wound Measurement - multiple wounds 0 INTERVENTIONS - Wound Dressings $RemoveBeforeD'[]'gWbGTTrmBFlZxE$  - Small Wound Dressing one or multiple wounds 0 $Remove'[]'sHyWnBN$  - Medium Wound Dressing one or multiple wounds 0 $Remove'[]'dpAZkVc$  - Large Wound Dressing one or multiple wounds 0 $Remove'[]'vWhpFEW$  - Application of Medications - topical 0 $RemoveB'[]'HUsgwYiB$  - Application of Medications - injection 0 [name], [name] ([phone]) INTERVENTIONS - Miscellaneous $RemoveBeforeD'[]'nKlMbyfAYbCzxt$  - External ear exam 0 $Remo'[]'kqRLk$  - Specimen Collection (cultures, biopsies, blood, body fluids, etc.) 0 $Remov'[]'RiemPS$  - Specimen(s) / Culture(s) sent or taken to Lab for analysis 0 $RemoveBe'[]'VjBTqStEe$  - Patient Transfer (multiple staff / Civil Service fast streamer / Similar devices) 0 $RemoveBe'[]'NhyAeDEgc$  - Simple Staple / Suture removal (25 or less) 0 $Remov'[]'WLIXfL$  - Complex Staple / Suture removal (26 or more) 0 $Remov'[]'eoDHsd$  - Hypo / Hyperglycemic Management (close monitor of Blood Glucose) 0 X - Ankle / Brachial Index (ABI) - do not check if billed separately 1 15 X - Vital Signs 1 5 Has the patient been seen at the hospital within the last three years: Yes Total Score: 115 Level Of Care: New/Established - Level 3 Electronic Signature(s) Signed: 12/05/2014 2:20:18 Collins By: [name] Entered By: [name] on 12/05/2014 14:20:16 [place], [name]  [phone]) -------------------------------------------------------------------------------- Encounter Discharge Information Details [name], 12/05/2014 1:15 Patient Name: Date of Service: [facility] Collins Medical Record Patient Account Number: [id number] Number: Treating [name]: [name] Date of Birth/Sex: 1970-09-22 (44 y.o. Female) Other Clinician: Primary Care Physician: [name] Treating [name] Referring Physician: [name] Physician/Extender: [name] in Treatment: 0 Encounter Discharge Information Items Schedule Follow-up Appointment: No Medication Reconciliation completed No and provided to Patient/Care [place] Jenifer: Provided on Clinical Summary of Care: 12/05/2014 Form Type Recipient Paper Patient HTP Electronic Signature(s) Signed: 12/05/2014 2:17:41 Collins By: Ruthine Dose Entered By: Ruthine Dose on 12/05/2014 14:17:40 [name], [place] [phone]) -------------------------------------------------------------------------------- General Visit Notes Details [place] [name], 12/05/2014 1:15 Patient Name: Date of Service: [facility] Collins Medical Record Patient Account Number: [id number] Number: Treating [name]: [name] Date of Birth/Sex: Apr 26, 1971 (44 y.o. Female) Other Clinician: Primary Care Physician: [name] Treating [name] Referring Physician: [name] Physician/Extender: [name] in Treatment: 0 Notes Not able to do wound assessments in iheal dt error message. Electronic Signature(s) Signed: 12/05/2014 6:18:01 Collins By: Megan Cool, [name], [name], [name] [name], [name] Entered By: Megan Cool, [name], [name], [name] on 12/05/2014 13:48:44 AARNA, MIHALKO [phone]) -------------------------------------------------------------------------------- Lower Extremity Assessment Details [name], 12/05/2014 1:15 Patient Name: Date of Service: [facility] Collins Medical Record Patient Account Number: [id number] Number: Treating [name]: [name] Date of Birth/Sex: [birth year] (44 y.o. Female) Other Clinician: Primary Care Physician: [name] Treating [name] Referring Physician: [name] Physician/Extender: [name] in Treatment: 0 Edema Assessment Assessed: [Left: No] [Right: No] Edema: [Left: No] [Right: No] Vascular Assessment Pulses: Posterior Tibial Palpable: [Left:Yes] [Right:Yes] Dorsalis Pedis Palpable: [Left:Yes] [Right:Yes] Extremity colors, hair growth, and conditions: Extremity Color: [Left:Hyperpigmented] [Right:Hyperpigmented] Hair Growth on Extremity: [Left:Yes] [Right:Yes] Temperature of Extremity: [Left:Warm] [Right:Warm] Capillary Refill: [Left:< 3 seconds] Blood Pressure: Brachial: [Left:132] [Right:120] Dorsalis Pedis: 172 [Left:Dorsalis Pedis: 142] Ankle: Posterior Tibial: 164 [Left:Posterior Tibial: 164 1.30] [Right:1.24] Toe Nail Assessment Left: Right: Thick: No  No Discolored: No No Deformed: No No Improper Length and Hygiene: No No Electronic Signature(s) Signed: 12/05/2014 6:18:01 Collins By: Megan Cool, [name], [name], [name] [name], [name] Entered By: Megan Cool, [name], [name], [name] on 12/05/2014 13:34:32 [place], [place] [phone]) -------------------------------------------------------------------------------- Multi Wound Chart Details [place] [name], 12/05/2014 1:15 Patient Name: Date of Service: [facility] Collins Medical Record Patient Account Number: [id number] Number: Treating [name]: [name] Date of Birth/Sex: [birth year] (44 y.o. 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] Date Acquired: [2:08/05/2014] [3:08/05/2014] [4:08/05/2014] Weeks of Treatment: [2:0] [3:0] [4:0] Wound Status: [2:Open] [3:Open] [4:Open] Measurements L x W x D 2x0.7x0.1 [3:0.2x0.3x0.1] [4:5.2x1x0.1] (cm) Area (cm) : [2:1.1] [3:0.047] [4:4.084] Volume (cm) : [2:0.11] [3:0.005] [4:0.408] Periwound Skin Texture: No Abnormalities Noted [3:No Abnormalities Noted] [4:No Abnormalities Noted] Periwound Skin [2:No Abnormalities Noted] [3:No Abnormalities Noted] [4:No Abnormalities Noted] Moisture: Periwound Skin Color: No Abnormalities Noted [3:No Abnormalities Noted] [4:No Abnormalities Noted] Tenderness on [2:No] [3:No] [4:No] Treatment Notes Electronic Signature(s) Signed: 12/05/2014 5:25:40 Collins By: [name] Entered By: [name] on 12/05/2014 14:09:48 [place] [phone]) -------------------------------------------------------------------------------- Multi-Disciplinary Care Plan Details [place] [name], 12/05/2014 1:15 Patient Name: Date of Service: [facility] Collins Medical Record Patient Account Number: [id number] Number: Treating [name]: [name] Date of Birth/Sex: August 21, 1970 (44 y.o. Female) Other Clinician: Primary Care Physician: [name] Treating [name] Referring Physician: [name] Physician/Extender: [name] in Treatment: 0 Active Inactive Electronic Signature(s) Signed: 12/05/2014 5:25:40 Collins By: [name] Entered By: [name] on 12/05/2014 [place], [place] [phone]) -------------------------------------------------------------------------------- Pain Assessment Details [place] [name], 12/05/2014 1:15 Patient  Name: Date of Service: [facility] Collins Medical Record Patient Account Number: [id number] Number: Treating [name]: [name] Date of Birth/Sex: Nov 17, 1970 (44 y.o. Female) Other Clinician: Primary Care Physician: [name] Treating [name] Referring Physician: [name] Physician/Extender: [name] in Treatment: 0 Active Problems Location of Pain Severity and Description of Pain Patient Has Paino Yes Site Locations Pain Location: Generalized Pain Pain Management and Medication Current Pain Management: Notes Neuropathy Electronic Signature(s) Signed: 12/05/2014 6:18:01 Collins By: Megan Cool, [name], [name], [name] [name], [name] Entered By: Megan Cool, [name], [name], [name] on 12/05/2014 13:20:16 [name] [phone]) -------------------------------------------------------------------------------- Patient/Caregiver Education Details [name], 12/05/2014 1:15 Patient Name: Date of Service: [facility] Collins Medical Record Patient Account Number: [id number] Number: Treating [name]: [name] Date of Birth/Gender: 21-Feb-1971 (44 y.o. Female) Other Clinician: Primary Care Physician: [name] Treating [name] Referring Physician: [name] Physician/Extender: [name] in Treatment: 0 Education Assessment Education Provided To: Patient Education Topics Provided Wound/Skin Impairment: Handouts: Other: f/u with PCP for psychiatric and dermatology referrals Methods: Demonstration, Explain/Verbal Responses: State content correctly Electronic Signature(s) Signed: 12/05/2014 2:19:20 Collins By: [name] Entered By: [name] on 12/05/2014 14:19:20 [place] [name], [name] [phone]) -------------------------------------------------------------------------------- Vitals Details [place], 12/05/2014 1:15 Patient Name: Date of Service: [facility] Collins Medical Record Patient Account Number: [id number] Number: Treating [name]: [name] Date of  Birth/Sex: Jan 30, 1971 (44 y.o. Female) Other Clinician: Primary Care Physician: [name] Treating [name] Referring Physician: [name]  Physician/Extender: [name] in Treatment: 0 Vital Signs Time Taken: 13:20 Temperature (F): 98.5 Height (in): 72 Pulse (bpm): 100 Source: Stated Respiratory Rate (breaths/min): 16 Weight (lbs): 160 Blood Pressure (mmHg): 132/78 Source: Stated Reference Range: 80 - 120 mg / dl Body Mass Index (BMI): 21.7 Electronic Signature(s) Signed: 12/05/2014 6:18:01 Collins By: Megan Cool, [name], [name], [name] [name], [name] Entered By: Megan Cool, [name], [name], [name] on 12/05/2014 13:23:41

## 2014-12-06 NOTE — Progress Notes (Signed)
[name], [name] [phone]) Visit Report for 12/05/2014 Abuse/Suicide Risk Screen Details [name], 12/05/2014 1:15 Patient Name: Date of Service: [facility] PM Medical Record Patient Account Number: [id number] Number: Treating RN: Cornell Barman Date of Birth/Sex: 24-Jan-1971 (44 y.o. Female) Other Clinician: Primary Care Treating GUTIERREZ, Shea Stakes, [facility] Physician: Physician/Extender: Referring Physician: Eliezer Lofts Weeks in Treatment: 0 Abuse/Suicide Risk Screen Items Answer ABUSE/SUICIDE RISK SCREEN: Has anyone close to you tried to hurt or harm you recentlyo No Do you feel uncomfortable with anyone in your familyo No Has anyone forced you do things that you didnot want to doo No Do you have any thoughts of harming yourselfo No Patient displays signs or symptoms of abuse and/or neglect. No Electronic Signature(s) Signed: 12/05/2014 6:18:01 PM By: Gretta Cool, RN, BSN, [name] RN, BSN Entered By: Gretta Cool, RN, BSN, [name] on 12/05/2014 13:52:31 [place], [place] [phone]) -------------------------------------------------------------------------------- Activities of Daily Living Details [name], 12/05/2014 1:15 Patient Name: Date of Service: [facility] PM Medical Record Patient Account Number: [id number] Number: Treating RN: Cornell Barman Date of Birth/Sex: 11/10/1970 (44 y.o. Female) Other Clinician: Primary Care Treating GUTIERREZ, Shea Stakes, [name] Physician: Physician/Extender: Referring Physician: Eliezer Lofts Weeks in Treatment: 0 Activities of Daily Living Items Answer Activities of Daily Living (Please select one for each item) Drive Automobile Completely Able Take Medications Completely Able Use Telephone Completely Able Care for Appearance Completely Able Use Toilet Completely Able Bath / Shower Completely Able Dress Self Completely Able Feed Self Completely Able Walk Completely Able Get In / Out Bed Completely  Able Housework Completely Able Prepare Meals Completely [place] for Self Completely Able Electronic Signature(s) Signed: 12/05/2014 6:18:01 PM By: Gretta Cool, RN, BSN, [name] RN, BSN Entered By: Gretta Cool, RN, BSN, [name] on 12/05/2014 13:52:49 [place], [place] [phone]) -------------------------------------------------------------------------------- Education Assessment Details [name], 12/05/2014 1:15 Patient Name: Date of Service: [facility] PM Medical Record Patient Account Number: [id number] Number: Treating RN: Cornell Barman Date of Birth/Sex: 11-23-1970 (44 y.o. Female) Other Clinician: Primary Care Treating GUTIERREZ, Shea Stakes, [name] Physician: Physician/Extender: Referring Physician: Leslye Peer in Treatment: 0 Learning Preferences/Education Level/Primary Language Learning Preference: Explanation, Demonstration, Printed Material Highest Education Level: High School Preferred Language: English Cognitive Barrier Assessment/Beliefs Language Barrier: No Translator Needed: No Memory Deficit: No Emotional Barrier: No Cultural/Religious Beliefs Affecting Medical No Care: Physical Barrier Assessment Impaired Vision: No Impaired Hearing: No Decreased Hand dexterity: No Knowledge/Comprehension Assessment Knowledge Level: High Comprehension Level: High Ability to understand written High instructions: Ability to understand verbal High instructions: Motivation Assessment Anxiety Level: Calm Cooperation: Cooperative Education Importance: Acknowledges Need Interest in Health Problems: Asks Questions Perception: Coherent Willingness to Engage in Self- High Management Activities: Readiness to Engage in Self- High Management Activities: SHARMANE, DAME [phone]) Electronic Signature(s) Signed: 12/05/2014 6:18:01 PM By: Gretta Cool, RN, BSN, [name] RN, BSN Entered By: Gretta Cool, RN, BSN, [name] on 12/05/2014 13:53:17 [place], [place] [phone]) -------------------------------------------------------------------------------- Fall Risk Assessment Details [name], 12/05/2014 1:15 Patient Name: Date of Service: [facility] PM Medical Record Patient Account Number: [id number] Number: Treating RN: Cornell Barman Date of Birth/Sex: 19-Dec-1970 (44 y.o. Female) Other Clinician: Primary Care Treating GUTIERREZ, Shea Stakes, [name] Physician: Physician/Extender: Referring Physician: Eliezer Lofts Weeks in Treatment: 0 Fall Risk Assessment Items FALL RISK ASSESSMENT: History of falling - immediate or within 3 months 0 No Secondary diagnosis 0 No Ambulatory aid None/bed rest/wheelchair/nurse 0 Yes Crutches/cane/walker 0 No Furniture 0 No IV Access/Saline Lock 0 No Gait/Training Normal/bed rest/immobile 0 Yes Weak 0 No  Impaired 0 No Mental Status Oriented to own ability 0 Yes Electronic Signature(s) Signed: 12/05/2014 6:18:01 PM By: Gretta Cool, RN, BSN, [name] RN, BSN Entered By: Gretta Cool, RN, BSN, [name] on 12/05/2014 13:53:28 [name], Earnest [place] [phone]) -------------------------------------------------------------------------------- Foot Assessment Details [name], 12/05/2014 1:15 Patient Name: Date of Service: [facility] PM Medical Record Patient Account Number: [id number] Number: Treating RN: Cornell Barman Date of Birth/Sex: [birth year] (44 y.o. Female) Other Clinician: Primary Care Treating GUTIERREZ, Shea Stakes, [name] Physician: Physician/Extender: Referring Physician: Eliezer Lofts Weeks in Treatment: 0 Foot Assessment Items Site Locations + = Sensation present, - = Sensation absent, C = Callus, U = Ulcer R = Redness, W = Warmth, M = Maceration, PU = Pre-ulcerative lesion F = Fissure, S = Swelling, D = Dryness Assessment Right: Left: Other Deformity: No No Prior Foot Ulcer: No No Prior Amputation: No No Charcot Joint: No No Ambulatory Status: Ambulatory Without  Help Gait: Steady Electronic Signature(s) Signed: 12/05/2014 6:18:01 PM By: Gretta Cool, RN, BSN, [name] RN, BSN Entered By: Gretta Cool, RN, BSN, [name] on 12/05/2014 13:54:33 [address] [place] [phone]) [name], [place] [phone]) -------------------------------------------------------------------------------- Nutrition Risk Assessment Details [place] Collins, 12/05/2014 1:15 Patient Name: Date of Service: [facility] PM Medical Record Patient Account Number: [id number] Number: Treating RN: Cornell Barman Date of Birth/Sex: 01-08-1971 (44 y.o. Female) Other Clinician: Primary Care Treating GUTIERREZ, Shea Stakes, [name] Physician: Physician/Extender: Referring Physician: Eliezer Lofts Weeks in Treatment: 0 Height (in): 72 Weight (lbs): 160 Body Mass Index (BMI): 21.7 Nutrition Risk Assessment Items NUTRITION RISK SCREEN: I have an illness or condition that made me change the kind and/or 0 No amount of food I eat I eat fewer than two meals per day 0 No I eat few fruits and vegetables, or milk products 0 No I have three or more drinks of beer, liquor or wine almost every day 0 No I have tooth or mouth problems that make it hard for me to eat 0 No I don't always have enough money to buy the food I need 0 No I eat alone most of the time 0 No I take three or more different prescribed or over-the-counter drugs a 0 No day Without wanting to, I have lost or gained 10 pounds in the last six 0 No months I am not always physically able to shop, cook and/or feed myself 0 No Nutrition Protocols Good Risk Protocol 0 No interventions needed Moderate Risk Protocol Electronic Signature(s) Signed: 12/05/2014 6:18:01 PM By: Gretta Cool, RN, BSN, [name] RN, BSN Entered By: Gretta Cool, RN, BSN, [name] on 12/05/2014 13:53:34

## 2015-01-30 ENCOUNTER — Other Ambulatory Visit: Payer: Self-pay

## 2016-07-30 DIAGNOSIS — L97911 Non-pressure chronic ulcer of unspecified part of right lower leg limited to breakdown of skin: Secondary | ICD-10-CM | POA: Insufficient documentation

## 2016-07-30 DIAGNOSIS — E1140 Type 2 diabetes mellitus with diabetic neuropathy, unspecified: Secondary | ICD-10-CM | POA: Insufficient documentation

## 2016-07-30 DIAGNOSIS — E042 Nontoxic multinodular goiter: Secondary | ICD-10-CM | POA: Insufficient documentation

## 2017-07-09 ENCOUNTER — Encounter (HOSPITAL_COMMUNITY): Payer: Self-pay | Admitting: Emergency Medicine

## 2017-07-09 ENCOUNTER — Ambulatory Visit (HOSPITAL_COMMUNITY)
Admission: EM | Admit: 2017-07-09 | Discharge: 2017-07-09 | Disposition: A | Discharge status: Home / Self Care | Payer: Self-pay | Attending: Family Medicine | Admitting: Family Medicine

## 2017-07-09 DIAGNOSIS — R112 Nausea with vomiting, unspecified: Secondary | ICD-10-CM

## 2017-07-09 DIAGNOSIS — R1032 Left lower quadrant pain: Secondary | ICD-10-CM

## 2017-07-09 MED ORDER — ONDANSETRON 4 MG PO TBDP: 4.0000 mg | ORAL_TABLET | Freq: Three times a day (TID) | ORAL | 0 refills | Status: DC | PRN

## 2017-07-09 NOTE — ED Triage Notes (Signed)
Pt sts some abd pain yesterday and N/V starting last night

## 2017-07-09 NOTE — Discharge Instructions (Signed)
Please return here or to the Emergency Department immediately should you feel worse in any way or have any of the following symptoms: increasing or different abdominal pain, persistent vomiting, fevers, or shaking chills. ° ° °

## 2017-07-10 NOTE — ED Provider Notes (Signed)
[place]   [id number] 07/09/17 Arrival Time: 1937  ASSESSMENT & PLAN:  1. Abdominal discomfort in left lower quadrant   2. Nausea and vomiting, intractability of vomiting not specified, unspecified vomiting type     Meds ordered this encounter  Medications  . ondansetron (ZOFRAN-ODT) 4 MG disintegrating tablet    Sig: Take 1 tablet (4 mg total) by mouth every 8 (eight) hours as needed for nausea or vomiting.    Dispense:  15 tablet    Refill:  0   Still within the first 24 hours of symptoms. Overall her exam is benign except for mild LLQ tenderness. Nausea/vomiting has basically subsided but I have prescribed Zofran for her to have at home. Abdominal pain precautions given. Close observation tonight. May f/u here in the morning if needed. Agrees to go to the Emergency Department if she experiences acute worsening of her symptoms.  Reviewed expectations re: course of current medical issues. Questions answered. Outlined signs and symptoms indicating need for more acute intervention. Patient verbalized understanding. After Visit Summary given.   SUBJECTIVE:  [name] is a 46 y.o. female who presents with complaint of abdominal discomfort. Onset gradual, since yesterday. Described as dull. Location: LLQ without radiation. Described symptoms are gradually improving since beginning. Aggravating factors: none. Alleviating factors: none. Associated symptoms: anorexia, nausea and vomiting. The patient denies chills, diarrhea, dysuria, fever, hematuria and myalgias. Appetite: decreased. PO intake: decreased. Ambulatory without assistance. Urinary symptoms: none. Last bowel movement yesterday without blood. OTC treatment: None.  Past Surgical History:  Procedure Laterality Date  . treadmill stress test  01/2013   WNL, low risk study    ROS: As per HPI. All other systems negative.  OBJECTIVE:  Vitals:   07/09/17 1501  BP: 110/70  Pulse: (!) 105  Resp: 18    Temp: 98.6 F (37 C)  TempSrc: Oral  SpO2: 99%    General appearance: alert; no distress; appears comfortable Lungs: clear to auscultation bilaterally Heart: slightly tachycardic; regular Abdomen: soft; non-distended; mild tenderness over LLQ; bowel sounds present; no masses or organomegaly; no guarding or rebound tenderness Back: no CVA tenderness Extremities: no edema; symmetrical with no gross deformities Skin: warm and dry Neurologic: normal gait Psychological: alert and cooperative; normal mood and affect  Allergies  Allergen Reactions  . Gabapentin Other (See Comments)    Dizziness                                               Past Medical History:  Diagnosis Date  . Arthritis   . History of chicken pox   . Migraines   . Mood disorder (HCC)    anxiety  . Prurigo nodularis    with diabetic dermopathy  . Type 1 diabetes, uncontrolled, with neuropathy ([place])    [place]   Social History   Socioeconomic History  . Marital status: Married    Spouse name: Not on file  . Number of children: Not on file  . Years of education: Not on file  . Highest education level: Not on file  Social Needs  . Financial resource strain: Not on file  . Food insecurity - worry: Not on file  . Food insecurity - inability: Not on file  . Transportation needs - medical: Not on file  . Transportation needs - non-medical: Not on file  Occupational History  .  Not on file  Tobacco Use  . Smoking status: Never Smoker  . Smokeless tobacco: Never Used  Substance and Sexual Activity  . Alcohol use: Yes    Comment: Occasional wine  . Drug use: No  . Sexual activity: Yes    Partners: Male    Birth control/protection: Pill  Other Topics Concern  . Not on file  Social History Narrative   Caffeine use: daily   Occupation: cleans houses   Regular exercise: yes, active 5x/wk   Diet: good water, fruits/vegetables daily   Family History  Problem Relation Age of Onset  . Diabetes Mother         type 2  . Hypertension Mother   . Thyroid disease Mother   . Bipolar disorder Mother   . Heart disease Mother   . Calcium disorder Mother   . Cancer Maternal Grandmother        Breast, stomach  . Cancer Paternal Grandmother        stomach, lung (smoker)  . Diabetes Paternal Grandmother   . Diabetes Paternal Grandfather   . CAD Sister        stent  . Stroke Maternal Aunt   . Cancer Maternal Uncle        prostate  . CAD Maternal Aunt        stents  . Cancer Maternal Aunt 64       ovarian     Vanessa Kick, MD 07/10/17 1053

## 2017-09-24 ENCOUNTER — Encounter: Payer: Self-pay | Admitting: Gastroenterology

## 2017-10-10 ENCOUNTER — Ambulatory Visit: Payer: Self-pay | Admitting: Gastroenterology

## 2017-10-23 ENCOUNTER — Other Ambulatory Visit: Payer: Self-pay | Admitting: *Deleted

## 2017-10-23 DIAGNOSIS — Z1231 Encounter for screening mammogram for malignant neoplasm of breast: Secondary | ICD-10-CM

## 2017-10-28 ENCOUNTER — Other Ambulatory Visit: Payer: Self-pay | Admitting: *Deleted

## 2017-10-28 DIAGNOSIS — N644 Mastodynia: Secondary | ICD-10-CM

## 2017-11-04 ENCOUNTER — Ambulatory Visit
Admission: RE | Admit: 2017-11-04 | Discharge: 2017-11-04 | Disposition: A | Discharge status: Home / Self Care | Payer: BLUE CROSS/BLUE SHIELD | Source: Ambulatory Visit | Attending: *Deleted | Admitting: *Deleted

## 2017-11-04 DIAGNOSIS — N644 Mastodynia: Secondary | ICD-10-CM

## 2017-11-25 ENCOUNTER — Emergency Department (HOSPITAL_COMMUNITY)
Admission: EM | Admit: 2017-11-25 | Discharge: 2017-11-25 | Disposition: A | Discharge status: Home / Self Care | Payer: BLUE CROSS/BLUE SHIELD | Attending: Emergency Medicine | Admitting: Emergency Medicine

## 2017-11-25 ENCOUNTER — Other Ambulatory Visit: Payer: Self-pay

## 2017-11-25 ENCOUNTER — Emergency Department (HOSPITAL_COMMUNITY): Payer: BLUE CROSS/BLUE SHIELD

## 2017-11-25 ENCOUNTER — Encounter (HOSPITAL_COMMUNITY): Payer: Self-pay

## 2017-11-25 DIAGNOSIS — Z794 Long term (current) use of insulin: Secondary | ICD-10-CM | POA: Insufficient documentation

## 2017-11-25 DIAGNOSIS — M79602 Pain in left arm: Secondary | ICD-10-CM | POA: Insufficient documentation

## 2017-11-25 DIAGNOSIS — R0789 Other chest pain: Secondary | ICD-10-CM | POA: Diagnosis not present

## 2017-11-25 DIAGNOSIS — E1040 Type 1 diabetes mellitus with diabetic neuropathy, unspecified: Secondary | ICD-10-CM | POA: Diagnosis not present

## 2017-11-25 DIAGNOSIS — Z79899 Other long term (current) drug therapy: Secondary | ICD-10-CM | POA: Insufficient documentation

## 2017-11-25 LAB — BASIC METABOLIC PANEL
ANION GAP: 7 (ref 5–15)
BUN: 13 mg/dL (ref 6–20)
CALCIUM: 8.8 mg/dL — AB (ref 8.9–10.3)
CHLORIDE: 102 mmol/L (ref 101–111)
CO2: 26 mmol/L (ref 22–32)
Creatinine, Ser: 0.61 mg/dL (ref 0.44–1.00)
GFR calc non Af Amer: >60 mL/min (ref >60)
Glucose, Bld: 347 mg/dL — ABNORMAL HIGH (ref 65–99)
POTASSIUM: 4 mmol/L (ref 3.5–5.1)
Sodium: 135 mmol/L (ref 135–145)

## 2017-11-25 LAB — CBC
HCT: 36.9 % (ref 36.0–46.0)
HEMOGLOBIN: 12.5 g/dL (ref 12.0–15.0)
MCH: 29.6 pg (ref 26.0–34.0)
MCHC: 33.9 g/dL (ref 30.0–36.0)
MCV: 87.2 fL (ref 78.0–100.0)
Platelets: 220 10*3/uL (ref 150–400)
RBC: 4.23 MIL/uL (ref 3.87–5.11)
RDW: 12.8 % (ref 11.5–15.5)
WBC: 5.1 10*3/uL (ref 4.0–10.5)

## 2017-11-25 LAB — I-STAT BETA HCG BLOOD, ED (MC, WL, AP ONLY): I-stat hCG, quantitative: <5 m[IU]/mL (ref <5)

## 2017-11-25 LAB — I-STAT TROPONIN, ED
TROPONIN I, POC: 0 ng/mL (ref 0.00–0.08)
TROPONIN I, POC: 0 ng/mL (ref 0.00–0.08)

## 2017-11-25 MED ORDER — DICLOFENAC SODIUM 1 % TD GEL: 2.0000 g | Freq: Four times a day (QID) | TRANSDERMAL | 0 refills | Status: DC

## 2017-11-25 NOTE — ED Triage Notes (Signed)
Pt reports chest pain Saturday night that woke her from sleeping. She states similar episode last night. She states she took some tums without improvement. She was seen at [facility] yesterday for same.

## 2017-11-25 NOTE — Discharge Instructions (Addendum)
Continue taking your medications as prescribed. You may use Voltaren gel as needed for pain. Follow up with your primary care doctor for further evaluation of your pain and blood pressure.  You may contact the [phone] number the back of the paperwork to help establish primary care. Return to the emergency room if you develop difficulty breathing, numbness, start dropping things with your hand, or any new or concerning symptoms.

## 2017-11-25 NOTE — ED Provider Notes (Signed)
[facility] EMERGENCY DEPARTMENT Provider Note   CSN: [id number] Arrival date & time: 11/25/17  1023     History   Chief Complaint Chief Complaint  Patient presents with  . Chest Pain    HPI [name] is a 47 y.o. female presenting for evaluation of CP and L arm pain.   Pt states she had CP in feb, was thought to be coming from the breast, but had a normal mammogram. CP resolved, until sat night, pt was woken from sleep by CP. This mildly improved with tums. Sunday night, pt had repeat episode of CP.  Chest pain is described as a pressure tightness that sits substernally.  It is intermittent, lasting for several minutes before resolving.  It is not worse with exertion or with position.  No correlation with food intake.  Denies associated shortness of breath, nausea, vomiting, or diaphoresis.  Additionally, patient reports left arm pain.  This is severe, sharp, and constant.  Nothing makes it better or worse.  It is not associated with movement.  She denies numbness or tingling.  Denies fall, trauma, or injury.  She has not taken anything for pain.  She denies history of similar.  Denies recent fevers, chills, sore throat, cough, abdominal pain, urinary symptoms, abnormal bowel movements, leg pain or swelling.  She has a history of diabetes and diabetic neuropathy of her legs, and her diabetes is poorly controlled at this time.  She is working on eating better control now that she has insurance.  Patient states she has never been diagnosed with high blood pressure, but multiple family members have high blood pressure.  She has had elevated readings past 4 times she has been at the urgent care, but is not on any blood pressure medicine currently.  She does not smoke cigarettes, drinks 1 alcoholic drink a day, denies drug use.  Family history of CHF, no family history of ACS.  She has a history of hyperlipidemia, recently started on Lipitor.  She was seen at the UC  yesterday, was told she had an abnormal EKG.  No prior history of heart problems.  No recent travel, surgeries, immobilization.  No history of prior DVT/PE, history of cancer, or recent trauma.  HPI  Past Medical History:  Diagnosis Date  . Arthritis   . History of chicken pox   . Migraines   . Mood disorder (HCC)    anxiety  . Prurigo nodularis    with diabetic dermopathy  . Type 1 diabetes, uncontrolled, with neuropathy [facility])    [place]    Patient Active Problem List   Diagnosis Date Noted  . Complicated grieving 23/53/6144  . Ulcers of both lower legs ([place]) 02/20/2014  . Health maintenance examination 09/20/2013  . Diabetic ulcer of left foot ([place]) 05/12/2013  . Diabetic foot ulcers ([place]) 05/01/2013  . Skin rash 01/05/2013  . Contraception management 12/31/2012  . Chest pain 12/30/2012  . Mood disorder ([place]) 12/30/2012  . Unspecified vitamin D deficiency 12/03/2012  . H/O thyroid nodule 12/03/2012  . Type 1 diabetes mellitus with diabetic neuropathy ([place]) 06/18/2007    Past Surgical History:  Procedure Laterality Date  . treadmill stress test  01/2013   WNL, low risk study     OB History    Gravida  6   Para  2   Term      Preterm      AB  4   Living  2     SAB  4   TAB      Ectopic      Multiple      Live Births               Home Medications    Prior to Admission medications   Medication Sig Start Date End Date Taking? Authorizing Provider  ALPRAZolam Duanne Moron) 0.5 MG tablet Take 0.5 mg by mouth 3 (three) times daily. 11/24/17  Yes [provider]  atorvastatin (LIPITOR) 20 MG tablet Take 20 mg by mouth daily. 11/24/17  Yes [provider]  Cyanocobalamin (VITAMELTS ENERGY VITAMIN B-12 PO) Take 1 tablet by mouth daily.   Yes [provider]  doxepin (SINEQUAN) 10 MG capsule Take 10 mg by mouth at bedtime. 10/21/17  Yes [provider]  GILDESS FE 1/20 1-20 MG-MCG tablet TAKE ONE TABLET BY MOUTH EVERY DAY  07/11/14  Yes Ria Bush, MD  glucagon (GLUCAGON EMERGENCY) 1 MG injection Inject 1 mg into the muscle once as needed. 12/02/12  Yes Philemon Kingdom, MD  hydrOXYzine (ATARAX/VISTARIL) 10 MG tablet TAKE ONE TABLET BY MOUTH THREE TIMES DAILY AS NEEDED 05/06/14  Yes Ria Bush, MD  insulin aspart (NOVOLOG FLEXPEN) 100 UNIT/ML FlexPen Inject 8 units into the skin at breakfast, 12 units at lunch and 8 units at dinner. Patient taking differently: Inject 15-20 Units into the skin 3 (three) times daily with meals. Inject 15 units into the skin at breakfast, 15 units at lunch and 20 units per sliding scale. 07/25/14  Yes Philemon Kingdom, MD  Insulin Degludec (TRESIBA FLEXTOUCH) 200 UNIT/ML SOPN Inject 25 Units into the skin 2 (two) times daily.   Yes [provider]  lactulose (CHRONULAC) 10 GM/15ML solution Take 15 mLs by mouth daily as needed for constipation.   Yes [provider]  Multiple Vitamins-Minerals (RA ONE DAILY GUMMY VITES) CHEW Chew 1 tablet by mouth daily.   Yes [provider]  pregabalin (LYRICA) 100 MG capsule TAKE ONE CAPSULE BY MOUTH TWICE DAILY 03/18/14  Yes Ria Bush, MD  Blood Glucose Monitoring Suppl (BLOOD GLUCOSE METER KIT AND SUPPLIES) Dispense based on patient and insurance preference. Use up to four times daily as directed. (FOR ICD-9 250.00, 250.01). 06/15/14   Ria Bush, MD  citalopram (CELEXA) 20 MG tablet Take 1 tablet (20 mg total) by mouth daily. Patient not taking: Reported on 11/25/2017 04/12/14   Ria Bush, MD  clonazePAM (KLONOPIN) 0.5 MG tablet Take 1 tablet (0.5 mg total) by mouth at bedtime. Patient not taking: Reported on 11/25/2017 05/06/14   Ria Bush, MD  diclofenac sodium (VOLTAREN) 1 % GEL Apply 2 g topically 4 (four) times daily. 11/25/17   Sevrin Sally, PA-C  glucose blood (ACCU-CHEK ACTIVE STRIPS) test strip Check 3-4 times daily for uncontrolled insulin dependent type 1 diabetes 06/15/14    Ria Bush, MD  hydrALAZINE (APRESOLINE) 25 MG tablet Take 25 mg by mouth 3 (three) times daily. 09/24/17   [provider]  Lancets (ACCU-CHEK SOFT TOUCH) lancets Check 3-4 times daily for uncontrolled insulin dependent type 1 diabetes 06/15/14   Ria Bush, MD  ondansetron (ZOFRAN-ODT) 4 MG disintegrating tablet Take 1 tablet (4 mg total) by mouth every 8 (eight) hours as needed for nausea or vomiting. Patient not taking: Reported on 11/25/2017 07/09/17   Vanessa Kick, MD  SURE COMFORT PEN NEEDLES 31G X 5 MM MISC USE TO INJECT INSULIN FOUR TIMES DAILY. 06/13/14   Ria Bush, MD    Family History Family History  Problem  Relation Age of Onset  . Diabetes Mother        type 2  . Hypertension Mother   . Thyroid disease Mother   . Bipolar disorder Mother   . Heart disease Mother   . Calcium disorder Mother   . Cancer Maternal Grandmother        Breast, stomach  . Cancer Paternal Grandmother        stomach, lung (smoker)  . Diabetes Paternal Grandmother   . Diabetes Paternal Grandfather   . CAD Sister        stent  . Stroke Maternal Aunt   . Cancer Maternal Uncle        prostate  . CAD Maternal Aunt        stents  . Cancer Maternal Aunt 64       ovarian    Social History Social History   Tobacco Use  . Smoking status: Never Smoker  . Smokeless tobacco: Never Used  Substance Use Topics  . Alcohol use: Yes    Comment: Occasional wine  . Drug use: No     Allergies   Amoxicillin; Gabapentin; and Lantus [insulin glargine]   Review of Systems Review of Systems  Cardiovascular: Positive for chest pain (Intermittent).  Musculoskeletal: Positive for myalgias (Left arm pain).  All other systems reviewed and are negative.    Physical Exam Updated Vital Signs BP (!) 152/95   Pulse 89   Temp 97.7 F (36.5 C) (Oral)   Resp 14   LMP 11/10/2017   SpO2 100%   Physical Exam  Constitutional: She is oriented to person, place, and time. She  appears well-developed and well-nourished. No distress.  Sitting comfortably in bed in no apparent distress  HENT:  Head: Normocephalic and atraumatic.  Eyes: Pupils are equal, round, and reactive to light. Conjunctivae and EOM are normal.  Neck: Normal range of motion. Neck supple.  No tenderness palpation of neck or midline C-spine.  No step-offs.  Cardiovascular: Normal rate, regular rhythm and intact distal pulses.  Pulmonary/Chest: Effort normal and breath sounds normal. No respiratory distress. She has no wheezes.  Abdominal: Soft. She exhibits no distension and no mass. There is no tenderness. There is no guarding.  Musculoskeletal: Normal range of motion. She exhibits tenderness. She exhibits no edema or deformity.  Grip strength intact bilaterally.  Upper extremity strength intact.  Sensation intact bilaterally.  Good radial pulses bilaterally.  Full active range of motion of upper extremities.  No obvious deformity, swelling, or injury.  Mild tenderness palpation.  Negative Tinel's and Phalen's.  No lower leg swelling or pain.  Pedal pulses intact bilaterally.  Neurological: She is alert and oriented to person, place, and time. No sensory deficit.  Skin: Skin is warm and dry.  Psychiatric: She has a normal mood and affect.  Nursing note and vitals reviewed.    ED Treatments / Results  Labs (all labs ordered are listed, but only abnormal results are displayed) Labs Reviewed  BASIC METABOLIC PANEL - Abnormal; Notable for the following components:      Result Value   Glucose, Bld 347 (*)    Calcium 8.8 (*)    All other components within normal limits  CBC  I-STAT TROPONIN, ED  I-STAT BETA HCG BLOOD, ED ([facility], [facility], [facility] ONLY)  I-STAT TROPONIN, ED    EKG EKG Interpretation  Date/Time:  Tuesday November 25 2017 10:46:37 EDT Ventricular Rate:  96 PR Interval:  128 QRS Duration: 74 QT Interval:  360 QTC Calculation: 037 R Axis:   75 Text Interpretation:  Normal sinus rhythm  Normal ECG non-specific ST/T wave changes in inferior and lateral leads, no previous tracing for comparison Confirmed by Theotis Burrow [phone]) on 11/25/2017 12:04:06 PM   Radiology Dg Chest 2 View  Result Date: 11/25/2017 CLINICAL DATA:  Chest pain for 4 days. EXAM: CHEST - 2 VIEW COMPARISON:  None. FINDINGS: The heart size and mediastinal contours are within normal limits. Both lungs are clear. The visualized skeletal structures are unremarkable. IMPRESSION: Negative.  No active cardiopulmonary disease. Electronically Signed   By: Earle Gell M.D.   On: 11/25/2017 11:38    Procedures Procedures (including critical care time)  Medications Ordered in ED Medications - No data to display   Initial Impression / Assessment and Plan / ED Course  I have reviewed the triage vital signs and the nursing notes.  Pertinent labs & imaging results that were available during my care of the patient were reviewed by me and considered in my medical decision making (see chart for details).     Patient presenting for evaluation of chest pain and left arm pain.  Physical exam patient who appears nontoxic.  Labs reassuring, IMA globin stable, creatinine stable, troponin negative.  EKG shows changes without obvious STEMI.  Symptoms have been going on for 2 days and are intermittent.  No obvious neurologic deficits, no left arm weakness, atrophy, or obvious deformity.  At this time, doubt ACS, PE, infection, or spinal cord injury.  Heart score 3, PERC negative. ?Neuropathy vs muscular pain.  Patient to follow-up with her primary care doctor regarding her blood pressure and continued pain.  At this time, patient appears safe for discharge.  Return precautions given.  Patient states she understands and agrees to plan.  Final Clinical Impressions(s) / ED Diagnoses   Final diagnoses:  Atypical chest pain  Left arm pain    ED Discharge Orders        Ordered    diclofenac sodium (VOLTAREN) 1 % GEL  4 times daily      11/25/17 1532       Casara Perrier, PA-C 11/25/17 1703    Little, Wenda Overland, MD 12/02/17 [phone]

## 2018-09-24 DIAGNOSIS — E108 Type 1 diabetes mellitus with unspecified complications: Secondary | ICD-10-CM | POA: Diagnosis not present

## 2018-09-24 DIAGNOSIS — B350 Tinea barbae and tinea capitis: Secondary | ICD-10-CM | POA: Diagnosis not present

## 2018-10-22 ENCOUNTER — Ambulatory Visit (HOSPITAL_COMMUNITY)
Admission: EM | Admit: 2018-10-22 | Discharge: 2018-10-22 | Disposition: A | Discharge status: Home / Self Care | Payer: BLUE CROSS/BLUE SHIELD | Attending: Family Medicine | Admitting: Family Medicine

## 2018-10-22 ENCOUNTER — Encounter (HOSPITAL_COMMUNITY): Payer: Self-pay | Admitting: Family Medicine

## 2018-10-22 DIAGNOSIS — L0291 Cutaneous abscess, unspecified: Secondary | ICD-10-CM

## 2018-10-22 DIAGNOSIS — L02212 Cutaneous abscess of back [any part, except buttock]: Secondary | ICD-10-CM | POA: Diagnosis not present

## 2018-10-22 DIAGNOSIS — E1040 Type 1 diabetes mellitus with diabetic neuropathy, unspecified: Secondary | ICD-10-CM

## 2018-10-22 MED ORDER — LIDOCAINE-EPINEPHRINE (PF) 2 %-1:200000 IJ SOLN
INTRAMUSCULAR | Status: AC
  Filled 2018-10-22: qty 20

## 2018-10-22 MED ORDER — DOXYCYCLINE HYCLATE 100 MG PO TABS: 100.0000 mg | ORAL_TABLET | Freq: Two times a day (BID) | ORAL | 0 refills | Status: DC

## 2018-10-22 NOTE — ED Provider Notes (Addendum)
[place]    CSN: [id number] Arrival date & time: 10/22/18  1931     History   Chief Complaint Chief Complaint  Patient presents with  . Abscess    HPI [name] is a 48 y.o. female.   48 yo established [facility] woman presents with chief complaint of abscess.  She developed swelling over her thoracic spine area over the past week.  It is been coming more more tender with no drainage.  She has not had an abscess in this location before.  She has not had a fever.  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  She is diabetic.     Past Medical History:  Diagnosis Date  . Arthritis   . History of chicken pox   . Migraines   . Mood disorder (HCC)    anxiety  . Prurigo nodularis    with diabetic dermopathy  . Type 1 diabetes, uncontrolled, with neuropathy [facility])    [place]    Patient Active Problem List   Diagnosis Date Noted  . Complicated grieving 67/08/4101  . Ulcers of both lower legs ([place]) 02/20/2014  . Health maintenance examination 09/20/2013  . Diabetic ulcer of left foot ([place]) 05/12/2013  . Diabetic foot ulcers ([place]) 05/01/2013  . Skin rash 01/05/2013  . Contraception management 12/31/2012  . Chest pain 12/30/2012  . Mood disorder ([place]) 12/30/2012  . Unspecified vitamin D deficiency 12/03/2012  . H/O thyroid nodule 12/03/2012  . Type 1 diabetes mellitus with diabetic neuropathy ([place]) 06/18/2007    Past Surgical History:  Procedure Laterality Date  . treadmill stress test  01/2013   WNL, low risk study    OB History    Gravida  6   Para  2   Term      Preterm      AB  4   Living  2     SAB  4   TAB      Ectopic      Multiple      Live Births               Home Medications    Prior to Admission medications   Medication Sig Start Date End Date Taking? Authorizing Provider  ALPRAZolam Duanne Moron) 0.5 MG tablet Take 0.5 mg by mouth 3 (three) times daily. 11/24/17   [provider]  Blood Glucose  Monitoring Suppl (BLOOD GLUCOSE METER KIT AND SUPPLIES) Dispense based on patient and insurance preference. Use up to four times daily as directed. (FOR ICD-9 250.00, 250.01). 06/15/14   Ria Bush, MD  Cyanocobalamin [facility] ENERGY VITAMIN B-12 PO) Take 1 tablet by mouth daily.    [provider]  diclofenac sodium (VOLTAREN) 1 % GEL Apply 2 g topically 4 (four) times daily. 11/25/17   Caccavale, Sophia, PA-C  doxycycline (VIBRA-TABS) 100 MG tablet Take 1 tablet (100 mg total) by mouth 2 (two) times daily. 10/22/18   Robyn Haber, MD  GILDESS FE 1/20 1-20 MG-MCG tablet TAKE ONE TABLET BY MOUTH EVERY DAY 07/11/14   Ria Bush, MD  glucagon (GLUCAGON EMERGENCY) 1 MG injection Inject 1 mg into the muscle once as needed. 12/02/12   Philemon Kingdom, MD  glucose blood (ACCU-CHEK ACTIVE STRIPS) test strip Check 3-4 times daily for uncontrolled insulin dependent type 1 diabetes 06/15/14   Ria Bush, MD  hydrALAZINE (APRESOLINE) 25 MG tablet Take 25 mg by mouth 3 (three) times daily. 09/24/17   [provider]  hydrOXYzine (ATARAX/VISTARIL)  10 MG tablet TAKE ONE TABLET BY MOUTH THREE TIMES DAILY AS NEEDED 05/06/14   Ria Bush, MD  insulin aspart (NOVOLOG FLEXPEN) 100 UNIT/ML FlexPen Inject 8 units into the skin at breakfast, 12 units at lunch and 8 units at dinner. Patient taking differently: Inject 15-20 Units into the skin 3 (three) times daily with meals. Inject 15 units into the skin at breakfast, 15 units at lunch and 20 units per sliding scale. 07/25/14   Philemon Kingdom, MD  Insulin Degludec (TRESIBA FLEXTOUCH) 200 UNIT/ML SOPN Inject 25 Units into the skin 2 (two) times daily.    [provider]  lactulose (CHRONULAC) 10 GM/15ML solution Take 15 mLs by mouth daily as needed for constipation.    [provider]  Lancets (ACCU-CHEK SOFT TOUCH) lancets Check 3-4 times daily for uncontrolled insulin dependent type 1 diabetes 06/15/14    Ria Bush, MD  Multiple Vitamins-Minerals (RA ONE DAILY GUMMY VITES) CHEW Chew 1 tablet by mouth daily.    [provider]  ondansetron (ZOFRAN-ODT) 4 MG disintegrating tablet Take 1 tablet (4 mg total) by mouth every 8 (eight) hours as needed for nausea or vomiting. Patient not taking: Reported on 11/25/2017 07/09/17   Vanessa Kick, MD  pregabalin (LYRICA) 100 MG capsule TAKE ONE CAPSULE BY MOUTH TWICE DAILY Patient not taking: Reported on 10/22/2018 03/18/14   Ria Bush, MD  SURE COMFORT PEN NEEDLES 31G X 5 MM MISC USE TO INJECT INSULIN FOUR TIMES DAILY. 06/13/14   Ria Bush, MD    Family History Family History  Problem Relation Age of Onset  . Diabetes Mother        type 2  . Hypertension Mother   . Thyroid disease Mother   . Bipolar disorder Mother   . Heart disease Mother   . Calcium disorder Mother   . Cancer Maternal Grandmother        Breast, stomach  . Cancer Paternal Grandmother        stomach, lung (smoker)  . Diabetes Paternal Grandmother   . Diabetes Paternal Grandfather   . CAD Sister        stent  . Stroke Maternal Aunt   . Cancer Maternal Uncle        prostate  . CAD Maternal Aunt        stents  . Cancer Maternal Aunt 64       ovarian    Social History Social History   Tobacco Use  . Smoking status: Never Smoker  . Smokeless tobacco: Never Used  Substance Use Topics  . Alcohol use: Yes    Comment: Occasional wine  . Drug use: No     Allergies   Amoxicillin; Gabapentin; and Lantus [insulin glargine]   Review of Systems Review of Systems   Physical Exam Triage Vital Signs ED Triage Vitals  Enc Vitals Group     BP      Pulse      Resp      Temp      Temp src      SpO2      Weight      Height      Head Circumference      Peak Flow      Pain Score      Pain Loc      Pain Edu?      Excl. in [place]?    No data found.  Updated Vital Signs BP 136/90   Pulse (!) 111  Temp (!) 97.5 F (36.4 C)   Resp 18    LMP 10/20/2018   SpO2 95%   Physical Exam Vitals signs and nursing note reviewed.  HENT:     Head: Normocephalic and atraumatic.     Mouth/Throat:     Mouth: Mucous membranes are moist.  Eyes:     Conjunctiva/sclera: Conjunctivae normal.  Neck:     Musculoskeletal: Normal range of motion and neck supple.  Musculoskeletal: Normal range of motion.  Skin:    General: Skin is warm and dry.     Comments: 3 cm raised erythematous dome shaped swelling over T5 on back.  Neurological:     General: No focal deficit present.     Mental Status: She is alert.  Psychiatric:        Mood and Affect: Mood normal.        UC Treatments / Results  Labs (all labs ordered are listed, but only abnormal results are displayed) Labs Reviewed - No data to display  EKG None  Radiology No results found.  Procedures Incision and Drainage Date/Time: 10/22/2018 8:11 PM Performed by: Robyn Haber, MD Authorized by: Robyn Haber, MD   Consent:    Consent obtained:  Verbal   Consent given by:  Patient   Risks discussed:  Incomplete drainage and infection   Alternatives discussed:  No treatment Location:    Type:  Abscess   Location:  Trunk   Trunk location:  Back Pre-procedure details:    Skin preparation:  Betadine Anesthesia (see MAR for exact dosages):    Anesthesia method:  Local infiltration   Local anesthetic:  Lidocaine 2% WITH epi Procedure type:    Complexity:  Simple Procedure details:    Needle aspiration: no     Incision types:  Stab incision   Incision depth:  Subcutaneous   Scalpel blade:  11   Wound management:  Probed and deloculated and debrided   Drainage:  Purulent   Drainage amount:  Moderate   Wound treatment:  Wound left open   Packing materials:  None Post-procedure details:    Patient tolerance of procedure:  Tolerated well, no immediate complications   (including critical care time)  Medications Ordered in UC Medications - No data to display   Initial Impression / Assessment and Plan / UC Course  I have reviewed the triage vital signs and the nursing notes.  Pertinent labs & imaging results that were available during my care of the patient were reviewed by me and considered in my medical decision making (see chart for details).    Final Clinical Impressions(s) / UC Diagnoses   Final diagnoses:  Abscess     Discharge Instructions     Wash with warm water twice daily for a week  Return if pain worsens or you notice increasing swelling    ED Prescriptions    Medication Sig Dispense Auth. Provider   doxycycline (VIBRA-TABS) 100 MG tablet Take 1 tablet (100 mg total) by mouth 2 (two) times daily. 20 tablet Robyn Haber, MD     Controlled Substance Prescriptions [place] Controlled Substance Registry consulted? Not Applicable   Robyn Haber, MD 10/22/18 Lance Sell, MD 10/22/18 2012

## 2018-10-22 NOTE — ED Triage Notes (Signed)
Pt states for the last few days shes noticed a bump on her back that's getting bigger.

## 2018-10-22 NOTE — ED Notes (Signed)
Patient verbalizes understanding of discharge instructions. Opportunity for questioning and answers were provided. Patient discharged from [facility] by MD. 

## 2018-10-22 NOTE — Discharge Instructions (Addendum)
Wash with warm water twice daily for a week  Return if pain worsens or you notice increasing swelling

## 2019-03-06 DIAGNOSIS — F419 Anxiety disorder, unspecified: Secondary | ICD-10-CM | POA: Diagnosis not present

## 2019-03-06 DIAGNOSIS — E1342 Other specified diabetes mellitus with diabetic polyneuropathy: Secondary | ICD-10-CM | POA: Diagnosis not present

## 2019-03-06 DIAGNOSIS — B351 Tinea unguium: Secondary | ICD-10-CM | POA: Diagnosis not present

## 2019-03-06 DIAGNOSIS — N390 Urinary tract infection, site not specified: Secondary | ICD-10-CM | POA: Diagnosis not present

## 2019-03-29 ENCOUNTER — Other Ambulatory Visit: Payer: Self-pay

## 2019-03-29 ENCOUNTER — Emergency Department (HOSPITAL_COMMUNITY)
Admission: EM | Admit: 2019-03-29 | Discharge: 2019-03-29 | Disposition: A | Discharge status: Home / Self Care | Payer: BC Managed Care – PPO | Attending: Emergency Medicine | Admitting: Emergency Medicine

## 2019-03-29 DIAGNOSIS — Z79899 Other long term (current) drug therapy: Secondary | ICD-10-CM | POA: Diagnosis not present

## 2019-03-29 DIAGNOSIS — E1065 Type 1 diabetes mellitus with hyperglycemia: Secondary | ICD-10-CM | POA: Diagnosis not present

## 2019-03-29 DIAGNOSIS — R112 Nausea with vomiting, unspecified: Secondary | ICD-10-CM | POA: Diagnosis not present

## 2019-03-29 DIAGNOSIS — E860 Dehydration: Secondary | ICD-10-CM | POA: Insufficient documentation

## 2019-03-29 DIAGNOSIS — R000 Tachycardia, unspecified: Secondary | ICD-10-CM | POA: Diagnosis not present

## 2019-03-29 DIAGNOSIS — R739 Hyperglycemia, unspecified: Secondary | ICD-10-CM

## 2019-03-29 DIAGNOSIS — R0902 Hypoxemia: Secondary | ICD-10-CM | POA: Diagnosis not present

## 2019-03-29 DIAGNOSIS — Z794 Long term (current) use of insulin: Secondary | ICD-10-CM | POA: Diagnosis not present

## 2019-03-29 DIAGNOSIS — R509 Fever, unspecified: Secondary | ICD-10-CM | POA: Diagnosis not present

## 2019-03-29 DIAGNOSIS — R5383 Other fatigue: Secondary | ICD-10-CM | POA: Diagnosis not present

## 2019-03-29 LAB — POCT I-STAT EG7
Acid-Base Excess: 5 mmol/L — ABNORMAL HIGH (ref 0.0–2.0)
Bicarbonate: 31.4 mmol/L — ABNORMAL HIGH (ref 20.0–28.0)
Calcium, Ion: 1.25 mmol/L (ref 1.15–1.40)
HCT: 45 % (ref 36.0–46.0)
Hemoglobin: 15.3 g/dL — ABNORMAL HIGH (ref 12.0–15.0)
O2 Saturation: 61 %
Potassium: 3.6 mmol/L (ref 3.5–5.1)
Sodium: 139 mmol/L (ref 135–145)
TCO2: 33 mmol/L — ABNORMAL HIGH (ref 22–32)
pCO2, Ven: 49.7 mmHg (ref 44.0–60.0)
pH, Ven: 7.409 (ref 7.250–7.430)
pO2, Ven: 32 mmHg (ref 32.0–45.0)

## 2019-03-29 LAB — CBC WITH DIFFERENTIAL/PLATELET
Abs Immature Granulocytes: 0.04 10*3/uL (ref 0.00–0.07)
Basophils Absolute: 0 10*3/uL (ref 0.0–0.1)
Basophils Relative: 0 %
Eosinophils Absolute: 0 10*3/uL (ref 0.0–0.5)
Eosinophils Relative: 0 %
HCT: 43.3 % (ref 36.0–46.0)
Hemoglobin: 15.2 g/dL — ABNORMAL HIGH (ref 12.0–15.0)
Immature Granulocytes: 1 %
Lymphocytes Relative: 14 %
Lymphs Abs: 1.2 10*3/uL (ref 0.7–4.0)
MCH: 29.5 pg (ref 26.0–34.0)
MCHC: 35.1 g/dL (ref 30.0–36.0)
MCV: 83.9 fL (ref 80.0–100.0)
Monocytes Absolute: 0.2 10*3/uL (ref 0.1–1.0)
Monocytes Relative: 2 %
Neutro Abs: 6.9 10*3/uL (ref 1.7–7.7)
Neutrophils Relative %: 83 %
Platelets: 300 10*3/uL (ref 150–400)
RBC: 5.16 MIL/uL — ABNORMAL HIGH (ref 3.87–5.11)
RDW: 11.8 % (ref 11.5–15.5)
WBC: 8.4 10*3/uL (ref 4.0–10.5)
nRBC: 0 % (ref 0.0–0.2)

## 2019-03-29 LAB — URINALYSIS, ROUTINE W REFLEX MICROSCOPIC
Bacteria, UA: NONE SEEN
Bilirubin Urine: NEGATIVE
Glucose, UA: >=500 mg/dL — AB
Hgb urine dipstick: NEGATIVE
Ketones, ur: 20 mg/dL — AB
Leukocytes,Ua: NEGATIVE
Nitrite: NEGATIVE
Protein, ur: >=300 mg/dL — AB
Specific Gravity, Urine: 1.021 (ref 1.005–1.030)
pH: 7 (ref 5.0–8.0)

## 2019-03-29 LAB — COMPREHENSIVE METABOLIC PANEL
ALT: 20 U/L (ref 0–44)
AST: 23 U/L (ref 15–41)
Albumin: 3.4 g/dL — ABNORMAL LOW (ref 3.5–5.0)
Alkaline Phosphatase: 94 U/L (ref 38–126)
Anion gap: 14 (ref 5–15)
BUN: 21 mg/dL — ABNORMAL HIGH (ref 6–20)
CO2: 29 mmol/L (ref 22–32)
Calcium: 10.3 mg/dL (ref 8.9–10.3)
Chloride: 95 mmol/L — ABNORMAL LOW (ref 98–111)
Creatinine, Ser: 1.1 mg/dL — ABNORMAL HIGH (ref 0.44–1.00)
GFR calc Af Amer: >60 mL/min (ref >60)
GFR calc non Af Amer: 59 mL/min — ABNORMAL LOW (ref >60)
Glucose, Bld: 493 mg/dL — ABNORMAL HIGH (ref 70–99)
Potassium: 3.6 mmol/L (ref 3.5–5.1)
Sodium: 138 mmol/L (ref 135–145)
Total Bilirubin: 1.2 mg/dL (ref 0.3–1.2)
Total Protein: 8 g/dL (ref 6.5–8.1)

## 2019-03-29 LAB — LACTIC ACID, PLASMA
Lactic Acid, Venous: 2.7 mmol/L (ref 0.5–1.9)
Lactic Acid, Venous: 2.4 mmol/L (ref 0.5–1.9)

## 2019-03-29 LAB — I-STAT CHEM 8, ED
BUN: 23 mg/dL — ABNORMAL HIGH (ref 6–20)
Calcium, Ion: 1.26 mmol/L (ref 1.15–1.40)
Chloride: 97 mmol/L — ABNORMAL LOW (ref 98–111)
Creatinine, Ser: 0.9 mg/dL (ref 0.44–1.00)
Glucose, Bld: 487 mg/dL — ABNORMAL HIGH (ref 70–99)
HCT: 46 % (ref 36.0–46.0)
Hemoglobin: 15.6 g/dL — ABNORMAL HIGH (ref 12.0–15.0)
Potassium: 3.6 mmol/L (ref 3.5–5.1)
Sodium: 139 mmol/L (ref 135–145)
TCO2: 29 mmol/L (ref 22–32)

## 2019-03-29 LAB — RAPID URINE DRUG SCREEN, HOSP PERFORMED
Amphetamines: NOT DETECTED
Barbiturates: NOT DETECTED
Benzodiazepines: NOT DETECTED
Cocaine: NOT DETECTED
Opiates: NOT DETECTED
Tetrahydrocannabinol: NOT DETECTED

## 2019-03-29 LAB — I-STAT BETA HCG BLOOD, ED (MC, WL, AP ONLY): I-stat hCG, quantitative: <5 m[IU]/mL (ref <5)

## 2019-03-29 LAB — CBG MONITORING, ED
Glucose-Capillary: 567 mg/dL (ref 70–99)
Glucose-Capillary: 274 mg/dL — ABNORMAL HIGH (ref 70–99)

## 2019-03-29 LAB — ETHANOL: Alcohol, Ethyl (B): <10 mg/dL (ref <10)

## 2019-03-29 LAB — LIPASE, BLOOD: Lipase: 27 U/L (ref 11–51)

## 2019-03-29 MED ORDER — METOCLOPRAMIDE HCL 5 MG/ML IJ SOLN
10.0000 mg | Freq: Once | INTRAMUSCULAR | Status: AC
  Administered 2019-03-29: 14:00:00 10 mg via INTRAVENOUS
  Filled 2019-03-29: qty 2

## 2019-03-29 MED ORDER — SODIUM CHLORIDE 0.9 % IV BOLUS
30.0000 mL/kg | Freq: Once | INTRAVENOUS | Status: AC
  Administered 2019-03-29: 1000 mL via INTRAVENOUS

## 2019-03-29 MED ORDER — ONDANSETRON HCL 4 MG/2ML IJ SOLN
4.0000 mg | Freq: Once | INTRAMUSCULAR | Status: AC
  Administered 2019-03-29: 4 mg via INTRAVENOUS
  Filled 2019-03-29: qty 2

## 2019-03-29 MED ORDER — ONDANSETRON 4 MG PO TBDP: 4.0000 mg | ORAL_TABLET | Freq: Three times a day (TID) | ORAL | 0 refills | Status: DC | PRN

## 2019-03-29 NOTE — Discharge Instructions (Addendum)
We set up an appointment for you with [facility]. You can also call [employer] to get a list of providers in your network, that appointment might actually end up being sooner than the one we provided.

## 2019-03-29 NOTE — ED Triage Notes (Signed)
Pt arrives POV from the urgent care c/o hyperglycemia; pt reports vomitting "for 3 weeks after starting antibxs"; pt states medication was prescribed by urgent for tx of e-coli. Pt states she is unable to keep "anything down".

## 2019-03-29 NOTE — ED Notes (Signed)
ED Provider at bedside.Patient verbalizes understanding of discharge instructions. Opportunity for questioning and answers were provided. Armband removed by staff, pt discharged from ED.

## 2019-03-29 NOTE — ED Provider Notes (Signed)
  Physical Exam  BP 128/80   Pulse (!) 108   Temp 98 F (36.7 C) (Oral)   Resp 15   Ht 6' (1.829 m)   Wt 77.1 kg   SpO2 97%   BMI 23.06 kg/m   Physical Exam  ED Course/Procedures     Procedures  MDM  Assuming care of patient from Dr. Ralene Bathe   Patient in the ED for n/v and elevated blood sugar. Workup thus far shows evidence of dehydration with slightly elevated lactic acid, elevated blood sugar and mildly elevated creatinine.  Patient has been hydrated in the ED and she feels a lot better.  Concerning findings are as following: None. Important pending results are : Outcome of p.o. challenge  According to Dr. Ralene Bathe, plan is to follow-up with the patient on p.o. challenge.  If she is doing well then she is stable for discharge with strict ER return precautions.  She has pain social worker to see if they can help patient get an appointment with a PCP, however if that is not accomplished by the time patient passes oral challenge it is okay to discharge.   Patient had no complains, no concerns from the nursing side. Will continue to monitor.        Varney Biles, MD 03/29/19 1538

## 2019-03-29 NOTE — ED Notes (Addendum)
Bag 2 (1000 ml) of NS 0.9% hung at 1415 at rate of 999 ml/h.

## 2019-03-29 NOTE — ED Notes (Signed)
Pt ambulated well with 1 person standby assist.

## 2019-03-29 NOTE — Progress Notes (Signed)
Consult request has been received. CSW attempting to follow up at present time  Marshon Bangs M. Kristopher Delk LCSWA Transitions of Care  Clinical Social Worker  Ph: [phone] 

## 2019-03-29 NOTE — ED Notes (Signed)
Pt tolerated fluids and food well; denies any n/v.

## 2019-03-29 NOTE — ED Provider Notes (Signed)
[place] EMERGENCY DEPARTMENT Provider Note   CSN: [id number] Arrival date & time: 03/29/19  1213     History   Chief Complaint No chief complaint on file.   HPI [name] is a 48 y.o. female.     The history is provided by the patient and a relative. No language interpreter was used.   [name] is a 48 y.o. female who presents to the Emergency Department complaining of vomiting, hyperglycemia.  She presents to the ED complaining of feeling dehydrated with a blood sugar at home that read critical high. She has been vomiting for the last three weeks, severe since 11 PM last night. Over the last three weeks she has experienced 2 to 3 episodes of vomiting daily until last night when it became profound. She feels like she is altered and not thinking appropriately. She feels dehydrated. She denies any fevers but does feel hot. She states that she was recently treated three weeks ago for E. coli in her urine. She denies any chest pain, abdominal pain, diarrhea, constipation. Past Medical History:  Diagnosis Date  . Arthritis   . History of chicken pox   . Migraines   . Mood disorder (HCC)    anxiety  . Prurigo nodularis    with diabetic dermopathy  . Type 1 diabetes, uncontrolled, with neuropathy [facility])    [place]    Patient Active Problem List   Diagnosis Date Noted  . Complicated grieving 16/38/4665  . Ulcers of both lower legs ([place]) 02/20/2014  . Health maintenance examination 09/20/2013  . Diabetic ulcer of left foot ([place]) 05/12/2013  . Diabetic foot ulcers ([place]) 05/01/2013  . Skin rash 01/05/2013  . Contraception management 12/31/2012  . Chest pain 12/30/2012  . Mood disorder ([place]) 12/30/2012  . Unspecified vitamin D deficiency 12/03/2012  . H/O thyroid nodule 12/03/2012  . 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 treadmill stress test   01/2013   WNL, low risk study     OB History    Gravida  6   Para  2   Term      Preterm      AB  4   Living  2     SAB  4   TAB      Ectopic      Multiple      Live Births               Home Medications    Prior to Admission medications   Medication Sig Start Date End Date Taking? Authorizing Provider  ALPRAZolam Duanne Moron) 0.5 MG tablet Take 0.5 mg by mouth 3 (three) times daily. 11/24/17   [provider]  Blood Glucose Monitoring Suppl (BLOOD GLUCOSE METER KIT AND SUPPLIES) Dispense based on patient and insurance preference. Use up to four times daily as directed. (FOR ICD-9 250.00, 250.01). 06/15/14   Ria Bush, MD  Cyanocobalamin [facility] ENERGY VITAMIN B-12 PO) Take 1 tablet by mouth daily.    [provider]  diclofenac sodium (VOLTAREN) 1 % GEL Apply 2 g topically 4 (four) times daily. 11/25/17   Caccavale, Sophia, PA-C  doxycycline (VIBRA-TABS) 100 MG tablet Take 1 tablet (100 mg total) by mouth 2 (two) times daily. 10/22/18   Robyn Haber, MD  GILDESS FE 1/20 1-20 MG-MCG tablet TAKE ONE TABLET BY MOUTH EVERY DAY 07/11/14   Ria Bush, MD  glucagon (GLUCAGON EMERGENCY) 1 MG injection Inject 1 mg into the muscle once as needed. 12/02/12   Philemon Kingdom, MD  glucose blood (ACCU-CHEK ACTIVE STRIPS) test strip Check 3-4 times daily for uncontrolled insulin dependent type 1 diabetes 06/15/14   Ria Bush, MD  hydrALAZINE (APRESOLINE) 25 MG tablet Take 25 mg by mouth 3 (three) times daily. 09/24/17   [provider]  hydrOXYzine (ATARAX/VISTARIL) 10 MG tablet TAKE ONE TABLET BY MOUTH THREE TIMES DAILY AS NEEDED 05/06/14   Ria Bush, MD  insulin aspart (NOVOLOG FLEXPEN) 100 UNIT/ML FlexPen Inject 8 units into the skin at breakfast, 12 units at lunch and 8 units at dinner. Patient taking differently: Inject 15-20 Units into the skin 3 (three) times daily with meals. Inject 15 units into the skin at breakfast,  15 units at lunch and 20 units per sliding scale. 07/25/14   Philemon Kingdom, MD  Insulin Degludec (TRESIBA FLEXTOUCH) 200 UNIT/ML SOPN Inject 25 Units into the skin 2 (two) times daily.    [provider]  lactulose (CHRONULAC) 10 GM/15ML solution Take 15 mLs by mouth daily as needed for constipation.    [provider]  Lancets (ACCU-CHEK SOFT TOUCH) lancets Check 3-4 times daily for uncontrolled insulin dependent type 1 diabetes 06/15/14   Ria Bush, MD  Multiple Vitamins-Minerals (RA ONE DAILY GUMMY VITES) CHEW Chew 1 tablet by mouth daily.    [provider]  ondansetron (ZOFRAN ODT) 4 MG disintegrating tablet Take 1 tablet (4 mg total) by mouth every 8 (eight) hours as needed for nausea or vomiting. 03/29/19   Quintella Reichert, MD  pregabalin (LYRICA) 100 MG capsule TAKE ONE CAPSULE BY MOUTH TWICE DAILY Patient not taking: Reported on 10/22/2018 03/18/14   Ria Bush, MD  SURE COMFORT PEN NEEDLES 31G X 5 MM MISC USE TO INJECT INSULIN FOUR TIMES DAILY. 06/13/14   Ria Bush, MD    Family History Family History  Problem Relation Age of Onset  . Diabetes Mother        type 2  . Hypertension Mother   . Thyroid disease Mother   . Bipolar disorder Mother   . Heart disease Mother   . Calcium disorder Mother   . Cancer Maternal Grandmother        Breast, stomach  . Cancer Paternal Grandmother        stomach, lung (smoker)  . Diabetes Paternal Grandmother   . Diabetes Paternal Grandfather   . CAD Sister        stent  . Stroke Maternal Aunt   . Cancer Maternal Uncle        prostate  . CAD Maternal Aunt        stents  . Cancer Maternal Aunt 64       ovarian    Social History Social History   Tobacco Use  . Smoking status: Never Smoker  . Smokeless tobacco: Never Used  Substance Use Topics  . Alcohol use: Yes    Comment: Occasional wine  . Drug use: No     Allergies   Amoxicillin, Gabapentin, and Lantus [insulin glargine]    Review of Systems Review of Systems  All other systems reviewed and are negative.    Physical Exam Updated Vital Signs BP 128/80   Pulse (!) 108   Temp 98 F (36.7 C) (Oral)   Resp 15   Ht 6' (1.829 m)   Wt 77.1 kg   SpO2 97%   BMI 23.06 kg/m   Physical  Exam Vitals signs and nursing note reviewed.  Constitutional:      Appearance: She is well-developed. She is ill-appearing.  HENT:     Head: Normocephalic and atraumatic.     Mouth/Throat:     Mouth: Mucous membranes are dry.  Cardiovascular:     Rate and Rhythm: Regular rhythm. Tachycardia present.     Heart sounds: No murmur.  Pulmonary:     Effort: Pulmonary effort is normal. No respiratory distress.     Breath sounds: Normal breath sounds.  Abdominal:     Palpations: Abdomen is soft.     Tenderness: There is no abdominal tenderness. There is no guarding or rebound.  Musculoskeletal:        General: No tenderness.  Skin:    General: Skin is warm and dry.  Neurological:     Mental Status: She is alert and oriented to person, place, and time.  Psychiatric:        Behavior: Behavior normal.      ED Treatments / Results  Labs (all labs ordered are listed, but only abnormal results are displayed) Labs Reviewed  COMPREHENSIVE METABOLIC PANEL - Abnormal; Notable for the following components:      Result Value   Chloride 95 (*)    Glucose, Bld 493 (*)    BUN 21 (*)    Creatinine, Ser 1.10 (*)    Albumin 3.4 (*)    GFR calc non Af Amer 59 (*)    All other components within normal limits  CBC WITH DIFFERENTIAL/PLATELET - Abnormal; Notable for the following components:   RBC 5.16 (*)    Hemoglobin 15.2 (*)    All other components within normal limits  URINALYSIS, ROUTINE W REFLEX MICROSCOPIC - Abnormal; Notable for the following components:   Glucose, UA >=500 (*)    Ketones, ur 20 (*)    Protein, ur >=300 (*)    All other components within normal limits  LACTIC ACID, PLASMA - Abnormal; Notable for the  following components:   Lactic Acid, Venous 2.7 (*)    All other components within normal limits  LACTIC ACID, PLASMA - Abnormal; Notable for the following components:   Lactic Acid, Venous 2.4 (*)    All other components within normal limits  CBG MONITORING, ED - Abnormal; Notable for the following components:   Glucose-Capillary 567 (*)    All other components within normal limits  I-STAT CHEM 8, ED - Abnormal; Notable for the following components:   Chloride 97 (*)    BUN 23 (*)    Glucose, Bld 487 (*)    Hemoglobin 15.6 (*)    All other components within normal limits  POCT I-STAT EG7 - Abnormal; Notable for the following components:   Bicarbonate 31.4 (*)    TCO2 33 (*)    Acid-Base Excess 5.0 (*)    Hemoglobin 15.3 (*)    All other components within normal limits  LIPASE, BLOOD  ETHANOL  RAPID URINE DRUG SCREEN, HOSP PERFORMED  I-STAT BETA HCG BLOOD, ED (MC, [facility], AP ONLY)  I-STAT VENOUS BLOOD GAS, ED  CBG MONITORING, ED  CBG MONITORING, ED    EKG EKG Interpretation  Date/Time:  Monday March 29 2019 12:26:21 EDT Ventricular Rate:  126 PR Interval:    QRS Duration: 80 QT Interval:  299 QTC Calculation: 433 R Axis:   84 Text Interpretation:  Sinus tachycardia LAE, consider biatrial enlargement Nonspecific repol abnormality, inferior leads Confirmed by Quintella Reichert [phone]) on 03/29/2019 12:35:05 PM  Radiology No results found.  Procedures Procedures (including critical care time)  Medications Ordered in ED Medications  sodium chloride 0.9 % bolus 2,313 mL (0 mL/kg  77.1 kg Intravenous Stopped 03/29/19 1514)  ondansetron (ZOFRAN) injection 4 mg (4 mg Intravenous Given 03/29/19 1307)  metoCLOPramide (REGLAN) injection 10 mg (10 mg Intravenous Given 03/29/19 1425)     Initial Impression / Assessment and Plan / ED Course  I have reviewed the triage vital signs and the nursing notes.  Pertinent labs & imaging results that were available during my care of the  patient were reviewed by me and considered in my medical decision making (see chart for details).        Patient with diabetes here for evaluation of three weeks of vomiting, hyperglycemia. Patient dehydrated on initial evaluation, retching. After treatment with IV fluids and antiemetics she is feeling significantly improved. She is making urine without difficulty. Labs without evidence of DKA. UA is not consistent with UTI. Presentation is not consistent with bowel obstruction. Discussed with patient home care for dehydration, diabetes. Discussed oral fluid hydration, outpatient follow-up and return precautions.  Patient care transferred pending PO challenge.  Final Clinical Impressions(s) / ED Diagnoses   Final diagnoses:  Hyperglycemia  Dehydration    ED Discharge Orders         Ordered    ondansetron (ZOFRAN ODT) 4 MG disintegrating tablet  Every 8 hours PRN     03/29/19 1524           Quintella Reichert, MD 03/29/19 1528

## 2019-03-29 NOTE — ED Notes (Signed)
ED Provider at bedside. 

## 2019-03-29 NOTE — ED Notes (Signed)
Remaining fluids (313 ml of 0.9% NS) hung at 999 ml/h at 1434.

## 2019-03-29 NOTE — Progress Notes (Signed)
CSW at bedside to address consult regarding PCP appointment. CSW scheduled follow up appointment for pt at [facility] available appointment available was 04/23/2019 at 2:30PM.   CSW provided pt with list of providers accepting new patient appointments within the [facility] network if pt wished to schedule appointment sooner than the one provided. Pt was gracious and understanding.   [place] Transitions of Care  Clinical Social Worker  Ph: [phone]

## 2019-03-29 NOTE — ED Notes (Signed)
Pt given [place] sandwich and sprite for PO challenge.

## 2019-03-30 ENCOUNTER — Encounter (HOSPITAL_COMMUNITY): Payer: Self-pay

## 2019-03-30 ENCOUNTER — Emergency Department (HOSPITAL_COMMUNITY): Payer: BC Managed Care – PPO

## 2019-03-30 ENCOUNTER — Inpatient Hospital Stay (HOSPITAL_COMMUNITY)
Admission: EM | Admit: 2019-03-30 | Discharge: 2019-04-01 | DRG: 392 | Disposition: A | Discharge status: Home / Self Care | Payer: BC Managed Care – PPO | Attending: Family Medicine | Admitting: Family Medicine

## 2019-03-30 DIAGNOSIS — R112 Nausea with vomiting, unspecified: Principal | ICD-10-CM | POA: Diagnosis present

## 2019-03-30 DIAGNOSIS — Z888 Allergy status to other drugs, medicaments and biological substances status: Secondary | ICD-10-CM

## 2019-03-30 DIAGNOSIS — Z794 Long term (current) use of insulin: Secondary | ICD-10-CM | POA: Diagnosis not present

## 2019-03-30 DIAGNOSIS — Z818 Family history of other mental and behavioral disorders: Secondary | ICD-10-CM

## 2019-03-30 DIAGNOSIS — Z823 Family history of stroke: Secondary | ICD-10-CM

## 2019-03-30 DIAGNOSIS — E1065 Type 1 diabetes mellitus with hyperglycemia: Secondary | ICD-10-CM | POA: Diagnosis present

## 2019-03-30 DIAGNOSIS — I10 Essential (primary) hypertension: Secondary | ICD-10-CM | POA: Diagnosis not present

## 2019-03-30 DIAGNOSIS — Z833 Family history of diabetes mellitus: Secondary | ICD-10-CM | POA: Diagnosis not present

## 2019-03-30 DIAGNOSIS — Z881 Allergy status to other antibiotic agents status: Secondary | ICD-10-CM

## 2019-03-30 DIAGNOSIS — R000 Tachycardia, unspecified: Secondary | ICD-10-CM | POA: Diagnosis present

## 2019-03-30 DIAGNOSIS — E10628 Type 1 diabetes mellitus with other skin complications: Secondary | ICD-10-CM | POA: Diagnosis not present

## 2019-03-30 DIAGNOSIS — Z8249 Family history of ischemic heart disease and other diseases of the circulatory system: Secondary | ICD-10-CM | POA: Diagnosis not present

## 2019-03-30 DIAGNOSIS — L281 Prurigo nodularis: Secondary | ICD-10-CM | POA: Diagnosis not present

## 2019-03-30 DIAGNOSIS — E1165 Type 2 diabetes mellitus with hyperglycemia: Secondary | ICD-10-CM | POA: Diagnosis not present

## 2019-03-30 DIAGNOSIS — N390 Urinary tract infection, site not specified: Secondary | ICD-10-CM

## 2019-03-30 DIAGNOSIS — R030 Elevated blood-pressure reading, without diagnosis of hypertension: Secondary | ICD-10-CM | POA: Diagnosis not present

## 2019-03-30 DIAGNOSIS — E1040 Type 1 diabetes mellitus with diabetic neuropathy, unspecified: Secondary | ICD-10-CM | POA: Diagnosis present

## 2019-03-30 DIAGNOSIS — E860 Dehydration: Secondary | ICD-10-CM | POA: Diagnosis not present

## 2019-03-30 DIAGNOSIS — Z20828 Contact with and (suspected) exposure to other viral communicable diseases: Secondary | ICD-10-CM | POA: Diagnosis not present

## 2019-03-30 DIAGNOSIS — Z8349 Family history of other endocrine, nutritional and metabolic diseases: Secondary | ICD-10-CM

## 2019-03-30 DIAGNOSIS — R531 Weakness: Secondary | ICD-10-CM | POA: Diagnosis not present

## 2019-03-30 DIAGNOSIS — R52 Pain, unspecified: Secondary | ICD-10-CM | POA: Diagnosis not present

## 2019-03-30 DIAGNOSIS — Z79899 Other long term (current) drug therapy: Secondary | ICD-10-CM

## 2019-03-30 DIAGNOSIS — G43909 Migraine, unspecified, not intractable, without status migrainosus: Secondary | ICD-10-CM | POA: Diagnosis not present

## 2019-03-30 DIAGNOSIS — F411 Generalized anxiety disorder: Secondary | ICD-10-CM | POA: Diagnosis not present

## 2019-03-30 DIAGNOSIS — F39 Unspecified mood [affective] disorder: Secondary | ICD-10-CM | POA: Diagnosis present

## 2019-03-30 DIAGNOSIS — R1084 Generalized abdominal pain: Secondary | ICD-10-CM | POA: Diagnosis not present

## 2019-03-30 DIAGNOSIS — R1110 Vomiting, unspecified: Secondary | ICD-10-CM | POA: Diagnosis present

## 2019-03-30 LAB — CBG MONITORING, ED: Glucose-Capillary: 150 mg/dL — ABNORMAL HIGH (ref 70–99)

## 2019-03-30 LAB — CBC WITH DIFFERENTIAL/PLATELET
Abs Immature Granulocytes: 0.02 10*3/uL (ref 0.00–0.07)
Basophils Absolute: 0 10*3/uL (ref 0.0–0.1)
Basophils Relative: 0 %
Eosinophils Absolute: 0 10*3/uL (ref 0.0–0.5)
Eosinophils Relative: 0 %
HCT: 42.1 % (ref 36.0–46.0)
Hemoglobin: 13.6 g/dL (ref 12.0–15.0)
Immature Granulocytes: 0 %
Lymphocytes Relative: 19 %
Lymphs Abs: 1.6 10*3/uL (ref 0.7–4.0)
MCH: 29.8 pg (ref 26.0–34.0)
MCHC: 32.3 g/dL (ref 30.0–36.0)
MCV: 92.1 fL (ref 80.0–100.0)
Monocytes Absolute: 0.4 10*3/uL (ref 0.1–1.0)
Monocytes Relative: 4 %
Neutro Abs: 6.6 10*3/uL (ref 1.7–7.7)
Neutrophils Relative %: 77 %
Platelets: 247 10*3/uL (ref 150–400)
RBC: 4.57 MIL/uL (ref 3.87–5.11)
RDW: 12.4 % (ref 11.5–15.5)
WBC: 8.7 10*3/uL (ref 4.0–10.5)
nRBC: 0 % (ref 0.0–0.2)

## 2019-03-30 LAB — URINALYSIS, ROUTINE W REFLEX MICROSCOPIC
Bilirubin Urine: NEGATIVE
Glucose, UA: >=500 mg/dL — AB
Hgb urine dipstick: NEGATIVE
Ketones, ur: NEGATIVE mg/dL
Nitrite: NEGATIVE
Protein, ur: >=300 mg/dL — AB
Specific Gravity, Urine: 1.016 (ref 1.005–1.030)
pH: 8 (ref 5.0–8.0)

## 2019-03-30 LAB — COMPREHENSIVE METABOLIC PANEL
ALT: 19 U/L (ref 0–44)
AST: 23 U/L (ref 15–41)
Albumin: 3.2 g/dL — ABNORMAL LOW (ref 3.5–5.0)
Alkaline Phosphatase: 73 U/L (ref 38–126)
Anion gap: 12 (ref 5–15)
BUN: 14 mg/dL (ref 6–20)
CO2: 25 mmol/L (ref 22–32)
Calcium: 9.8 mg/dL (ref 8.9–10.3)
Chloride: 108 mmol/L (ref 98–111)
Creatinine, Ser: 0.77 mg/dL (ref 0.44–1.00)
GFR calc Af Amer: >60 mL/min (ref >60)
GFR calc non Af Amer: >60 mL/min (ref >60)
Glucose, Bld: 199 mg/dL — ABNORMAL HIGH (ref 70–99)
Potassium: 3.5 mmol/L (ref 3.5–5.1)
Sodium: 145 mmol/L (ref 135–145)
Total Bilirubin: 0.8 mg/dL (ref 0.3–1.2)
Total Protein: 7.3 g/dL (ref 6.5–8.1)

## 2019-03-30 LAB — GLUCOSE, CAPILLARY
Glucose-Capillary: 120 mg/dL — ABNORMAL HIGH (ref 70–99)
Glucose-Capillary: 149 mg/dL — ABNORMAL HIGH (ref 70–99)

## 2019-03-30 LAB — HEMOGLOBIN A1C
Hgb A1c MFr Bld: 13.6 % — ABNORMAL HIGH (ref 4.8–5.6)
Mean Plasma Glucose: 343.62 mg/dL

## 2019-03-30 LAB — LIPASE, BLOOD: Lipase: 22 U/L (ref 11–51)

## 2019-03-30 MED ORDER — SODIUM CHLORIDE (PF) 0.9 % IJ SOLN
INTRAMUSCULAR | Status: AC
  Filled 2019-03-30: qty 50

## 2019-03-30 MED ORDER — MORPHINE SULFATE (PF) 4 MG/ML IV SOLN
4.0000 mg | Freq: Once | INTRAVENOUS | Status: AC
  Administered 2019-03-30: 4 mg via INTRAVENOUS
  Filled 2019-03-30: qty 1

## 2019-03-30 MED ORDER — SODIUM CHLORIDE 0.9 % IV SOLN
1.0000 g | INTRAVENOUS | Status: DC
  Administered 2019-03-31: 13:00:00 1 g via INTRAVENOUS
  Filled 2019-03-30: qty 1

## 2019-03-30 MED ORDER — ONDANSETRON HCL 4 MG/2ML IJ SOLN
4.0000 mg | Freq: Four times a day (QID) | INTRAMUSCULAR | Status: DC | PRN
  Administered 2019-03-30 – 2019-03-31 (×2): 4 mg via INTRAVENOUS
  Filled 2019-03-30 (×3): qty 2

## 2019-03-30 MED ORDER — IOHEXOL 300 MG/ML  SOLN
100.0000 mL | Freq: Once | INTRAMUSCULAR | Status: AC | PRN
  Administered 2019-03-30: 12:00:00 100 mL via INTRAVENOUS

## 2019-03-30 MED ORDER — SODIUM CHLORIDE 0.9 % IV SOLN
1.0000 g | Freq: Once | INTRAVENOUS | Status: AC
  Administered 2019-03-30: 13:00:00 1 g via INTRAVENOUS
  Filled 2019-03-30: qty 10

## 2019-03-30 MED ORDER — SUCRALFATE 1 GM/10ML PO SUSP
1.0000 g | Freq: Three times a day (TID) | ORAL | Status: AC
  Administered 2019-03-30 – 2019-03-31 (×2): 1 g via ORAL
  Filled 2019-03-30 (×2): qty 10

## 2019-03-30 MED ORDER — TRAMADOL HCL 50 MG PO TABS: 50.0000 mg | ORAL_TABLET | Freq: Two times a day (BID) | ORAL | Status: DC | PRN

## 2019-03-30 MED ORDER — MORPHINE SULFATE (PF) 2 MG/ML IV SOLN
2.0000 mg | INTRAVENOUS | Status: DC | PRN
  Administered 2019-03-30 (×2): 2 mg via INTRAVENOUS
  Filled 2019-03-30 (×2): qty 1

## 2019-03-30 MED ORDER — LIDOCAINE-EPINEPHRINE (PF) 2 %-1:200000 IJ SOLN: 10.0000 mL | Freq: Once | INTRAMUSCULAR | Status: DC

## 2019-03-30 MED ORDER — ENOXAPARIN SODIUM 40 MG/0.4ML ~~LOC~~ SOLN
40.0000 mg | SUBCUTANEOUS | Status: DC
  Administered 2019-03-30 – 2019-03-31 (×2): 40 mg via SUBCUTANEOUS
  Filled 2019-03-30 (×2): qty 0.4

## 2019-03-30 MED ORDER — METOCLOPRAMIDE HCL 5 MG/ML IJ SOLN
10.0000 mg | Freq: Once | INTRAMUSCULAR | Status: AC
  Administered 2019-03-30: 15:00:00 10 mg via INTRAVENOUS
  Filled 2019-03-30: qty 2

## 2019-03-30 MED ORDER — INSULIN ASPART 100 UNIT/ML ~~LOC~~ SOLN
0.0000 [IU] | Freq: Three times a day (TID) | SUBCUTANEOUS | Status: DC
  Administered 2019-03-31: 18:00:00 5 [IU] via SUBCUTANEOUS
  Administered 2019-03-31 (×2): 2 [IU] via SUBCUTANEOUS
  Administered 2019-04-01: 08:00:00 1 [IU] via SUBCUTANEOUS

## 2019-03-30 MED ORDER — INSULIN GLARGINE 100 UNIT/ML ~~LOC~~ SOLN: 10.0000 [IU] | Freq: Every day | SUBCUTANEOUS | Status: DC

## 2019-03-30 MED ORDER — PANTOPRAZOLE SODIUM 40 MG IV SOLR
40.0000 mg | Freq: Two times a day (BID) | INTRAVENOUS | Status: DC
  Administered 2019-03-30 – 2019-04-01 (×4): 40 mg via INTRAVENOUS
  Filled 2019-03-30 (×5): qty 40

## 2019-03-30 MED ORDER — SODIUM CHLORIDE 0.9 % IV BOLUS
1000.0000 mL | Freq: Once | INTRAVENOUS | Status: AC
  Administered 2019-03-30: 10:00:00 1000 mL via INTRAVENOUS

## 2019-03-30 MED ORDER — LORAZEPAM 0.5 MG PO TABS
0.5000 mg | ORAL_TABLET | Freq: Two times a day (BID) | ORAL | Status: DC | PRN
  Filled 2019-03-30: qty 1

## 2019-03-30 MED ORDER — SODIUM CHLORIDE 0.9 % IV SOLN
INTRAVENOUS | Status: AC
  Administered 2019-03-30 – 2019-03-31 (×2): via INTRAVENOUS

## 2019-03-30 MED ORDER — LORAZEPAM 2 MG/ML IJ SOLN
0.5000 mg | Freq: Once | INTRAMUSCULAR | Status: AC
  Administered 2019-03-30: 12:00:00 0.5 mg via INTRAVENOUS
  Filled 2019-03-30: qty 1

## 2019-03-30 MED ORDER — INSULIN DEGLUDEC 200 UNIT/ML ~~LOC~~ SOPN
10.0000 [IU] | PEN_INJECTOR | Freq: Two times a day (BID) | SUBCUTANEOUS | Status: DC
  Administered 2019-03-30 – 2019-03-31 (×2): 10 [IU] via SUBCUTANEOUS
  Filled 2019-03-30: qty 3

## 2019-03-30 MED ORDER — PROMETHAZINE HCL 25 MG/ML IJ SOLN
12.5000 mg | Freq: Four times a day (QID) | INTRAMUSCULAR | Status: DC | PRN
  Administered 2019-03-30: 20:00:00 12.5 mg via INTRAVENOUS
  Filled 2019-03-30: qty 1

## 2019-03-30 MED ORDER — INSULIN ASPART 100 UNIT/ML ~~LOC~~ SOLN: 0.0000 [IU] | Freq: Every day | SUBCUTANEOUS | Status: DC

## 2019-03-30 NOTE — ED Triage Notes (Signed)
Pt to ED via EMS from home, c/o abdominal pain and n&v, #22 R Hand, $Remov'8mg'YCAFtH$  Zofran given by EMS,no relief, continues to vomit bile, pt unable to hold food down past 3 days. Abdominal pain with n&v over the past 3 weeks. Pt discharged yesterday from ED with same complaint, was given RX that she was not able to fill. CBG 223. Hx DM insulin dependent.

## 2019-03-30 NOTE — ED Notes (Signed)
Pt requesting something for anxiety prior to CT scan, this nurse notified EDP

## 2019-03-30 NOTE — H&P (Signed)
History and Physical  Sopheap Basic UXL:[id number] DOB: [birth year] DOA: 03/30/2019  Referring physician: Suella Broad, PA-C PCP: Everardo Beals, NP  Outpatient Specialists: None Patient coming from: Home  Chief Complaint: Nausea vomiting unable to tolerate a diet.  HPI: [name] is a 48 year old female with past medical history significant for chronic anxiety, type 1 diabetes who presented to [facility] ED (once more after multiple ER visits for the same) due to persistent nausea and vomiting of 3 weeks duration.  Patient states she was diagnosed with UTI 3 weeks ago at urgent care was prescribed antibiotics which she has not been able to keep down due to constant nausea and vomiting.  She has been vomiting daily since.  Nausea and vomiting are associated with weight loss, not quantified, and epigastric pain.  No diarrhea.  She denies fevers, chills, or night sweats.  Denies known exposure to COVID-19.  No cardiopulmonary symptoms.  No marijuana or illicit drug use.  She has not been seen by GI in the past.  Not on PPI.  ED Course: In the ED, vital signs significant for elevated blood pressure and sinus tachycardia.  Lab studies remarkable for hyperglycemia with no metabolic acidosis in the setting of type 1 diabetes.  Lipase level normal.  UA remarkable for pyuria.  Unable to pass p.o. challenge in the ED.  TRH asked to admit for intractable nausea and vomiting, unable to tolerate p.o.   Review of Systems: Review of systems as noted in the HPI. All other systems reviewed and are negative.   Past Medical History:  Diagnosis Date   Arthritis    History of chicken pox    Migraines    Mood disorder (HCC)    anxiety   Prurigo nodularis    with diabetic dermopathy   Type 1 diabetes, uncontrolled, with neuropathy ([place])    [place]   Past Surgical History:  Procedure Laterality Date   treadmill stress test  01/2013   WNL, low risk study    Social History:   reports that she has never smoked. She has never used smokeless tobacco. She reports current alcohol use. She reports that she does not use drugs.   Allergies  Allergen Reactions   Amoxicillin Itching   Gabapentin Other (See Comments)    Dizziness   Lantus [Insulin Glargine] Itching and Rash    Family History  Problem Relation Age of Onset   Diabetes Mother        type 2   Hypertension Mother    Thyroid disease Mother    Bipolar disorder Mother    Heart disease Mother    Calcium disorder Mother    Cancer Maternal Grandmother        Breast, stomach   Cancer Paternal Grandmother        stomach, lung (smoker)   Diabetes Paternal Grandmother    Diabetes Paternal 60    CAD Sister        stent   Stroke Maternal Aunt    Cancer Maternal Uncle        prostate   CAD Maternal Aunt        stents   Cancer Maternal Aunt 64       ovarian     Prior to Admission medications   Medication Sig Start Date End Date Taking? Authorizing Provider  Cyanocobalamin (VITAMELTS ENERGY VITAMIN B-12 PO) Take 1 tablet by mouth every Thursday.    Yes [provider]  diclofenac sodium (VOLTAREN) 1 %  Apply 2 g topically 4 (four) times daily. 11/25/17  Yes Caccavale, Sophia, PA-C  °glucagon (GLUCAGON EMERGENCY) 1 MG injection Inject 1 mg into the muscle once as needed. °Patient taking differently: Inject 1 mg into the muscle once as needed.  12/02/12  Yes Gherghe, Cristina, MD  °hydrALAZINE (APRESOLINE) 50 MG tablet Take 50 mg by mouth 3 (three) times daily.   Yes [provider]  °insulin aspart (NOVOLOG FLEXPEN) 100 UNIT/ML FlexPen Inject 8 units into the skin at breakfast, 12 units at lunch and 8 units at dinner. °Patient taking differently: Inject 15-20 Units into the skin 3 (three) times daily with meals. Inject 15 units into the skin at breakfast, 15 units at lunch and 20 units per sliding scale. 07/25/14  Yes Gherghe, Cristina, MD  °Insulin Degludec (TRESIBA  FLEXTOUCH) 200 UNIT/ML SOPN Inject 25 Units into the skin 2 (two) times daily.   Yes [provider]  °lactulose (CHRONULAC) 10 GM/15ML solution Take 15 mLs by mouth daily as needed for constipation.   Yes [provider]  °Multiple Vitamins-Minerals (RA ONE DAILY GUMMY VITES) CHEW Chew 1 tablet by mouth daily.   Yes [provider]  °Blood Glucose Monitoring Suppl (BLOOD GLUCOSE METER KIT AND SUPPLIES) Dispense based on patient and insurance preference. Use up to four times daily as directed. (FOR ICD-9 250.00, 250.01). 06/15/14   Gutierrez, Javier, MD  °glucose blood (ACCU-CHEK ACTIVE STRIPS) test strip Check 3-4 times daily for uncontrolled insulin dependent type 1 diabetes 06/15/14   Gutierrez, Javier, MD  °Lancets (ACCU-CHEK SOFT TOUCH) lancets Check 3-4 times daily for uncontrolled insulin dependent type 1 diabetes 06/15/14   Gutierrez, Javier, MD  °ondansetron (ZOFRAN ODT) 4 MG disintegrating tablet Take 1 tablet (4 mg total) by mouth every 8 (eight) hours as needed for nausea or vomiting. 03/29/19   Rees, Elizabeth, MD  ° ° °Physical Exam: °BP (!) 146/88    Pulse (!) 110    Temp 98.5 °F (36.9 °C) (Oral)    Resp 18    Ht 6' (1.829 m)    Wt 77.1 kg    LMP 03/16/2019 (Approximate)    SpO2 99%    BMI 23.06 kg/m²  ° °• General: 47 y.o. year-old female well developed well nourished in no acute distress.  Alert and oriented x4. °• Cardiovascular: Regular rate and rhythm with no rubs or gallops.  No thyromegaly or JVD noted.  No lower extremity edema. 2/4 pulses in all 4 extremities. °• Respiratory: Clear to auscultation with no wheezes or rales. Good inspiratory effort. °• Abdomen: Soft tenderness on palpation of epigastric area.  Nondistended with hypoactive bowel sounds x4 quadrants. °• Muskuloskeletal: No cyanosis, clubbing or edema noted bilaterally °• Neuro: CN II-XII intact, strength, sensation, reflexes °• Skin: No ulcerative lesions noted.  Healed wounds in lower extremities  bilaterally from skin picking as revealed by the patient.  °• Psychiatry: Judgement and insight appear normal. Mood is appropriate for condition and setting °   °   °   °Labs on Admission:  °Basic Metabolic Panel: °Recent Labs  °Lab 03/29/19 °1257 03/29/19 °1302 03/30/19 °0946  °NA 138 139   139 145  °K 3.6 3.6   3.6 3.5  °CL 95* 97* 108  °CO2 29  --  25  °GLUCOSE 493* 487* 199*  °BUN 21* 23* 14  °CREATININE 1.10* 0.90 0.77  °CALCIUM 10.3  --  9.8  ° °Liver Function Tests: °Recent Labs  °Lab 03/29/19 °1257 03/30/19 °0946  °AST   AST 23 23  ALT 20 19  ALKPHOS 94 73  BILITOT 1.2 0.8  PROT 8.0 7.3  ALBUMIN 3.4* 3.2*   Recent Labs  Lab 03/29/19 1257 03/30/19 0946  LIPASE 27 22   No results for input(s): AMMONIA in the last 168 hours. CBC: Recent Labs  Lab 03/29/19 1257 03/29/19 1302 03/30/19 0946  WBC 8.4  --  8.7  NEUTROABS 6.9  --  6.6  HGB 15.2* 15.3*   15.6* 13.6  HCT 43.3 45.0   46.0 42.1  MCV 83.9  --  92.1  PLT 300  --  247   Cardiac Enzymes: No results for input(s): CKTOTAL, CKMB, CKMBINDEX, TROPONINI in the last 168 hours.  BNP (last 3 results) No results for input(s): BNP in the last 8760 hours.  ProBNP (last 3 results) No results for input(s): PROBNP in the last 8760 hours.  CBG: Recent Labs  Lab 03/29/19 1215 03/29/19 1528 03/30/19 1118  GLUCAP 567* 274* 150*    Radiological Exams on Admission: Ct Abdomen Pelvis W Contrast  Result Date: 03/30/2019 CLINICAL DATA:  Abdominal pain, acute, generalized. Pain with nausea and vomiting over the past 3 weeks. Hyperglycemia. EXAM: CT ABDOMEN AND PELVIS WITH CONTRAST TECHNIQUE: Multidetector CT imaging of the abdomen and pelvis was performed using the standard protocol following bolus administration of intravenous contrast. CONTRAST:  13m OMNIPAQUE IOHEXOL 300 MG/ML  SOLN COMPARISON:  None. FINDINGS: Lower chest: The lung bases are clear without focal nodule, mass, or airspace disease. The heart size is normal. No significant  pleural or pericardial effusion is present. Hepatobiliary: No focal liver abnormality is seen. No gallstones, gallbladder wall thickening, or biliary dilatation. Pancreas: Unremarkable. No pancreatic ductal dilatation or surrounding inflammatory changes. Spleen: Normal in size without focal abnormality. Adrenals/Urinary Tract: The adrenal glands are normal bilaterally. Kidneys and ureters are within normal limits. There is no stone or mass lesion. The urinary bladder is within normal limits. Stomach/Bowel: Stomach and duodenum are within normal limits. Small bowel is unremarkable. Terminal ileum is normal. The appendix is visualized and within normal limits. The ascending and transverse colon are normal. Descending and sigmoid colon are normal. There is normal stool volume throughout the colon. Vascular/Lymphatic: No significant vascular findings are present. No enlarged abdominal or pelvic lymph nodes. Reproductive: Uterus and bilateral adnexa are unremarkable. Other: Minimal free fluid in the anatomic pelvis is likely physiologic. No other free fluid or free air is present. There is no significant ventral hernia. Musculoskeletal: Vertebral body heights alignment are maintained. No acute fracture traumatic subluxation is present. No focal lytic or blastic lesions are present. Mild leftward curvature is centered at L3-4. IMPRESSION: 1. No acute or focal lesion to explain the patient's abdominal pain, nausea, or vomiting. 2. Normal CT appearance of the bowel. Electronically Signed   By: CSan MorelleM.D.   On: 03/30/2019 12:46    EKG: I independently viewed the EKG done and my findings are as followed: None available at the time of this visit.  Assessment/Plan Present on Admission:  Vomiting  Active Problems:   Vomiting  Intractable nausea and vomiting, unclear etiology Unable to tolerate p.o. intake for the past 3 weeks Failed p.o. challenge in the ED Start gentle IV fluid hydration Start IV  antiemetics, IV Phenergan for intractable nausea and vomiting Start sucralfate before meals and before bedtime x1 day Start IV Protonix 40 mg twice daily Optimize pain control If symptomatology persists consult GI in the morning to rule out gastritis  Dehydration in  setting of poor oral intake °Start gentle IV fluid hydration °Closely monitor urine output °Encourage oral intake as tolerated ° °Recently diagnosed UTI °Was prescribed antibiotics but was unable to tolerate p.o. °UA positive for pyuria °Urine culture in process °Start Rocephin empirically °Narrow down antibiotics once ID and sensitivities return ° °Type 1 diabetes with hyperglycemia °States she continued to take her insulin despite nausea and vomiting °Obtain hemoglobin A1c °Start Lantus 10 units daily °Start insulin sliding scale °Avoid hypoglycemia in the setting of poor oral intake °Start clear liquid diet as tolerated ° °Generalized anxiety °No antianxiety medication from list of meds °Patient stated she was taking Cymbalta at home, please verify with pharmacy before restarting °Healed wounds in lower extremities bilaterally from skin picking as revealed by the patient. ° ° °Risks: Patient is at high risk for decompensation due to intractable nausea vomiting and dehydration.  She requires IV fluid hydration and IV antibiotics for UTI.  Will need at least 2 midnights for further evaluation and treatment of present condition. ° ° ° °DVT prophylaxis: Subcu Lovenox daily ° °Code Status: Full code as stated by the patient herself.  She is alert and oriented x4. ° °Family Communication: None at bedside. ° °Disposition Plan: Admit to MedSurg unit. ° °Consults called: None. ° °Admission status: Inpatient status. ° ° ° ° N  MD °[facility] °Pager [phone] ° °If 7PM-7AM, please contact night-coverage [email or web address] °Password [email or web address] ° °03/30/2019, 4:09 PM  °

## 2019-03-30 NOTE — ED Notes (Signed)
ED TO INPATIENT HANDOFF REPORT  Name/Age/Gender [name] 48 y.o. female  Code Status   Home/SNF/Other Home  Chief Complaint emesis  Level of Care/Admitting Diagnosis ED Disposition    ED Disposition Condition [facility]: [facility] [100102]  Level of Care: Med-Surg [16]  Covid Evaluation: Asymptomatic Screening Protocol (No Symptoms)  Diagnosis: Vomiting [270623]  Admitting Physician: Kayleen Memos [[id number]]  Attending Physician: Kayleen Memos [[id number]]  Estimated length of stay: past midnight tomorrow  Certification:: I certify this patient will need inpatient services for at least 2 midnights  PT Class (Do Not Modify): Inpatient [101]  PT Acc Code (Do Not Modify): Private [1]       Medical History Past Medical History:  Diagnosis Date  . Arthritis   . History of chicken pox   . Migraines   . Mood disorder (HCC)    anxiety  . Prurigo nodularis    with diabetic dermopathy  . Type 1 diabetes, uncontrolled, with neuropathy (HCC)    [place]    Allergies Allergies  Allergen Reactions  . Amoxicillin Itching  . Gabapentin Other (See Comments)    Dizziness  . Lantus [Insulin Glargine] Itching and Rash    IV Location/Drains/Wounds Patient Lines/Drains/Airways Status   Active Line/Drains/Airways    Name:   Placement date:   Placement time:   Site:   Days:   Peripheral IV 03/30/19 Right Hand   03/30/19    -    Hand   less than 1          Labs/Imaging Results for orders placed or performed during the hospital encounter of 03/30/19 (from the past 48 hour(s))  CBC with Differential     Status: None   Collection Time: 03/30/19  9:46 AM  Result Value Ref Range   WBC 8.7 4.0 - 10.5 K/uL   RBC 4.57 3.87 - 5.11 MIL/uL   Hemoglobin 13.6 12.0 - 15.0 g/dL   HCT 42.1 36.0 - 46.0 %   MCV 92.1 80.0 - 100.0 fL   MCH 29.8 26.0 - 34.0 pg   MCHC 32.3 30.0 - 36.0 g/dL   RDW 12.4 11.5 - 15.5 %   Platelets 247 150  - 400 K/uL   nRBC 0.0 0.0 - 0.2 %   Neutrophils Relative % 77 %   Neutro Abs 6.6 1.7 - 7.7 K/uL   Lymphocytes Relative 19 %   Lymphs Abs 1.6 0.7 - 4.0 K/uL   Monocytes Relative 4 %   Monocytes Absolute 0.4 0.1 - 1.0 K/uL   Eosinophils Relative 0 %   Eosinophils Absolute 0.0 0.0 - 0.5 K/uL   Basophils Relative 0 %   Basophils Absolute 0.0 0.0 - 0.1 K/uL   Immature Granulocytes 0 %   Abs Immature Granulocytes 0.02 0.00 - 0.07 K/uL    Comment: Performed at [facility], [place] [address] [place], [place] [address]  Comprehensive metabolic panel     Status: Abnormal   Collection Time: 03/30/19  9:46 AM  Result Value Ref Range   Sodium 145 135 - 145 mmol/L   Potassium 3.5 3.5 - 5.1 mmol/L   Chloride 108 98 - 111 mmol/L   CO2 25 22 - 32 mmol/L   Glucose, Bld 199 (H) 70 - 99 mg/dL   BUN 14 6 - 20 mg/dL   Creatinine, Ser 0.77 0.44 - 1.00 mg/dL   Calcium 9.8 8.9 - 10.3 mg/dL   Total Protein 7.3  6.5 - 8.1 g/dL   Albumin 3.2 (L) 3.5 - 5.0 g/dL   AST 23 15 - 41 U/L   ALT 19 0 - 44 U/L   Alkaline Phosphatase 73 38 - 126 U/L   Total Bilirubin 0.8 0.3 - 1.2 mg/dL   GFR calc non Af Amer >60 >60 mL/min   GFR calc Af Amer >60 >60 mL/min   Anion gap 12 5 - 15    Comment: Performed at [facility], [address], [place], [place] [address]  Lipase, blood     Status: None   Collection Time: 03/30/19  9:46 AM  Result Value Ref Range   Lipase 22 11 - 51 U/L    Comment: Performed at [facility], [address], [place], [place] [address]  Urinalysis, Routine w reflex microscopic     Status: Abnormal   Collection Time: 03/30/19 10:56 AM  Result Value Ref Range   Color, Urine RED (A) YELLOW    Comment: BIOCHEMICALS MAY BE AFFECTED BY COLOR   APPearance CLOUDY (A) CLEAR   Specific Gravity, Urine 1.016 1.005 - 1.030   pH 8.0 5.0 - 8.0   Glucose, UA >=500 (A) NEGATIVE mg/dL   Hgb urine dipstick NEGATIVE NEGATIVE   Bilirubin Urine NEGATIVE  NEGATIVE   Ketones, ur NEGATIVE NEGATIVE mg/dL   Protein, ur >=883 (A) NEGATIVE mg/dL   Nitrite NEGATIVE NEGATIVE   Leukocytes,Ua LARGE (A) NEGATIVE   RBC / HPF 6-10 0 - 5 RBC/hpf   WBC, UA 21-50 0 - 5 WBC/hpf   Bacteria, UA MANY (A) NONE SEEN   Squamous Epithelial / LPF 0-5 0 - 5   Mucus PRESENT     Comment: Performed at [facility], [address] [place], [place] [address]  CBG monitoring, ED     Status: Abnormal   Collection Time: 03/30/19 11:18 AM  Result Value Ref Range   Glucose-Capillary 150 (H) 70 - 99 mg/dL   Ct Abdomen Pelvis W Contrast  Result Date: 03/30/2019 CLINICAL DATA:  Abdominal pain, acute, generalized. Pain with nausea and vomiting over the past 3 weeks. Hyperglycemia. EXAM: CT ABDOMEN AND PELVIS WITH CONTRAST TECHNIQUE: Multidetector CT imaging of the abdomen and pelvis was performed using the standard protocol following bolus administration of intravenous contrast. CONTRAST:  OMNIPAQUE IOHEXOL 300 MG/ML  SOLN COMPARISON:  None. FINDINGS: Lower chest: The lung bases are clear without focal nodule, mass, or airspace disease. The heart size is normal. No significant pleural or pericardial effusion is present. Hepatobiliary: No focal liver abnormality is seen. No gallstones, gallbladder wall thickening, or biliary dilatation. Pancreas: Unremarkable. No pancreatic ductal dilatation or surrounding inflammatory changes. Spleen: Normal in size without focal abnormality. Adrenals/Urinary Tract: The adrenal glands are normal bilaterally. Kidneys and ureters are within normal limits. There is no stone or mass lesion. The urinary bladder is within normal limits. Stomach/Bowel: Stomach and duodenum are within normal limits. Small bowel is unremarkable. Terminal ileum is normal. The appendix is visualized and within normal limits. The ascending and transverse colon are normal. Descending and sigmoid colon are normal. There is normal stool volume throughout the  colon. Vascular/Lymphatic: No significant vascular findings are present. No enlarged abdominal or pelvic lymph nodes. Reproductive: Uterus and bilateral adnexa are unremarkable. Other: Minimal free fluid in the anatomic pelvis is likely physiologic. No other free fluid or free air is present. There is no significant ventral hernia. Musculoskeletal: Vertebral body heights alignment are maintained. No acute fracture  traumatic subluxation is present. No focal lytic or blastic lesions are present. Mild leftward curvature is centered at L3-4. IMPRESSION: 1. No acute or focal lesion to explain the patient's abdominal pain, nausea, or vomiting. 2. Normal CT appearance of the bowel. Electronically Signed   By: San Morelle M.D.   On: 03/30/2019 12:46    Pending Labs Unresulted Labs (From admission, onward)    Start     Ordered   03/30/19 1539  SARS CORONAVIRUS 2 (TAT 6-12 HRS) Nasal Swab Aptima Multi Swab  (Asymptomatic/Tier 2 Patients Labs)  Once,   STAT    Question Answer Comment  Is this test for diagnosis or screening Screening   Symptomatic for COVID-19 as defined by CDC No   Hospitalized for COVID-19 No   Admitted to ICU for COVID-19 No   Previously tested for COVID-19 No   Resident in a congregate (group) care setting No   Employed in healthcare setting No   Pregnant No      03/30/19 1539   03/30/19 1416  Urine culture  Add-on,   AD     03/30/19 1415          Vitals/Pain Today's Vitals   03/30/19 1300 03/30/19 1400 03/30/19 1440 03/30/19 1500  BP: 137/83 139/78 122/73 (!) 146/94  Pulse: 100 (!) 102 99 (!) 114  Resp: (!) $RemoveB'22 12 13 13  '[id number]$ Temp:      TempSrc:      SpO2: 99% 100% 98% 99%  Weight:      Height:      PainSc:        Isolation Precautions No active isolations  Medications Medications  sodium chloride (PF) 0.9 % injection (has no administration in time range)  pantoprazole (PROTONIX) injection 40 mg (40 mg Intravenous Given 03/30/19 1452)  sucralfate (CARAFATE)  1 GM/10ML suspension 1 g (1 g Oral Given 03/30/19 1449)  sodium chloride 0.9 % bolus 1,000 mL (0 mLs Intravenous Stopped 03/30/19 1040)  morphine 4 MG/ML injection 4 mg (4 mg Intravenous Given 03/30/19 1104)  LORazepam (ATIVAN) injection 0.5 mg (0.5 mg Intravenous Given 03/30/19 1144)  iohexol (OMNIPAQUE) 300 MG/ML solution 100 mL (100 mLs Intravenous Contrast Given 03/30/19 1202)  cefTRIAXone (ROCEPHIN) 1 g in sodium chloride 0.9 % 100 mL IVPB (0 g Intravenous Stopped 03/30/19 1330)  metoCLOPramide (REGLAN) injection 10 mg (10 mg Intravenous Given 03/30/19 1449)    Mobility walks with person assist

## 2019-03-30 NOTE — ED Notes (Signed)
EDP at bedside  

## 2019-03-30 NOTE — ED Notes (Signed)
Patient transported to CT 

## 2019-03-30 NOTE — ED Provider Notes (Signed)
°[facility]-EMERGENCY DEPT °Provider Note ° ° °CSN: [id number] °Arrival date & time: 03/30/19  0845 ° °  ° °History   °Chief Complaint °Chief Complaint  °Patient presents with  °• Abdominal Pain  °• Emesis  ° ° °HPI °[name] is a 47 y.o. female. ° °  ° °47yo female brought in by EMS for nausea and vomiting. Patient has a PMH of gastroparesis, insulin dependent diabetes, seen yesterday in the emergency room for same symptoms, suspected to be gastroparesis, vomiting was controlled and patient was discharged with prescription for Zofran.  Patient did not pick up her Zofran, states that she woke up at midnight with return of nausea and vomiting now with periumbilical abdominal pain.  Abdominal pain described as an ache, intermittent, nothing makes pain better or worse. Denies diarrhea, reports urinary incontinence with each episode of emesis.  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  °• Ulcers of both lower legs (HCC) 02/20/2014  °• Health maintenance examination 09/20/2013  °• Diabetic ulcer of left foot (HCC) 05/12/2013  °• Diabetic foot ulcers (HCC) 05/01/2013  °• Skin rash 01/05/2013  °• Contraception management 12/31/2012  °• Chest pain 12/30/2012  °• Mood disorder (HCC) 12/30/2012  °• Unspecified vitamin D deficiency 12/03/2012  °• H/O thyroid nodule 12/03/2012  °• 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 Authorizing Provider  °Cyanocobalamin (VITAMELTS ENERGY VITAMIN B-12 PO) Take 1 tablet by mouth every Thursday.    Yes [provider]  °diclofenac sodium (VOLTAREN) 1 % GEL Apply 2 g topically 4 (four) times daily. 11/25/17  Yes Caccavale, Sophia, PA-C  °glucagon (GLUCAGON EMERGENCY) 1 MG injection Inject 1 mg into the muscle once as needed. °Patient taking differently: Inject 1 mg into the muscle once as needed.  12/02/12  Yes Gherghe, Cristina, MD  °hydrALAZINE (APRESOLINE) 50 MG tablet Take 50 mg by mouth 3 (three) times daily.   Yes [provider]  °insulin aspart (NOVOLOG FLEXPEN) 100 UNIT/ML FlexPen Inject 8 units into the skin at breakfast, 12 units at lunch and 8 units at dinner. °Patient taking differently: Inject 15-20 Units into the skin 3 (three) times daily with meals. Inject 15 units into the skin at breakfast, 15 units at lunch and 20 units per sliding scale. 07/25/14  Yes Gherghe, Cristina, MD  °Insulin Degludec (TRESIBA FLEXTOUCH) 200 UNIT/ML SOPN   Inject 25 Units into the skin 2 (two) times daily.   Yes [provider]  °lactulose (CHRONULAC) 10 GM/15ML solution Take 15 mLs by mouth daily as needed for constipation.   Yes [provider]  °Multiple Vitamins-Minerals (RA ONE DAILY GUMMY VITES) CHEW Chew 1 tablet by mouth daily.   Yes [provider]  °Blood Glucose Monitoring Suppl (BLOOD GLUCOSE METER KIT AND SUPPLIES) Dispense based on patient and insurance preference. Use up to four times daily as directed. (FOR ICD-9 250.00, 250.01). 06/15/14   Gutierrez, Javier, MD  °glucose blood (ACCU-CHEK ACTIVE STRIPS) test strip Check 3-4 times daily for uncontrolled insulin dependent type 1 diabetes 06/15/14   Gutierrez, Javier, MD  °Lancets (ACCU-CHEK SOFT TOUCH) lancets Check 3-4  times daily for uncontrolled insulin dependent type 1 diabetes 06/15/14   Gutierrez, Javier, MD  °ondansetron (ZOFRAN ODT) 4 MG disintegrating tablet Take 1 tablet (4 mg total) by mouth every 8 (eight) hours as needed for nausea or vomiting. 03/29/19   Rees, Elizabeth, MD  ° ° °Family History °Family History  °Problem Relation Age of Onset  °• Diabetes Mother   °     type 2  °• Hypertension Mother   °• Thyroid disease Mother   °• Bipolar disorder Mother   °• Heart disease Mother   °• Calcium disorder Mother   °• Cancer Maternal Grandmother   °     Breast, stomach  °• Cancer Paternal Grandmother   °     stomach, lung (smoker)  °• Diabetes Paternal Grandmother   °• Diabetes Paternal Grandfather   °• CAD Sister   °     stent  °• Stroke Maternal Aunt   °• Cancer Maternal Uncle   °     prostate  °• CAD Maternal Aunt   °     stents  °• Cancer Maternal Aunt 64  °     ovarian  ° ° °Social History °Social History  ° °Tobacco Use  °• Smoking status: Never Smoker  °• Smokeless tobacco: Never Used  °Substance Use Topics  °• Alcohol use: Yes  °  Comment: Occasional wine  °• Drug use: No  ° ° ° °Allergies   °Amoxicillin, Gabapentin, and Lantus [insulin glargine] ° ° °Review of Systems °Review of Systems  °Constitutional: Negative for chills and fever.  °Respiratory: Negative for shortness of breath.   °Cardiovascular: Negative for chest pain.  °Gastrointestinal: Positive for abdominal pain, nausea and vomiting. Negative for blood in stool, constipation and diarrhea.  °Genitourinary: Negative for dysuria, frequency and urgency.  °Musculoskeletal: Negative for back pain and myalgias.  °Skin: Negative for rash and wound.  °Allergic/Immunologic: Positive for immunocompromised state.  °Neurological: Positive for weakness.  °Psychiatric/Behavioral: Negative for confusion.  °All other systems reviewed and are negative. ° ° ° °Physical Exam °Updated Vital Signs °BP 139/78    Pulse (!) 102    Temp 98.5 °F (36.9 °C) (Oral)    Resp 12    Ht  6' (1.829 m)    Wt 77.1 kg    LMP 03/16/2019 (Approximate)    SpO2 100%    BMI 23.06 kg/m²  ° °Physical Exam °Vitals signs and nursing note reviewed.  °Constitutional:   °   General: She is not in acute distress. °   Appearance: She is well-developed. She is not diaphoretic.  °HENT:  °   Head: Normocephalic and atraumatic.  °Cardiovascular:  °   Rate and Rhythm: Regular rhythm. Tachycardia present.  °     Heart sounds: Normal heart sounds.  Pulmonary:     Effort: Pulmonary effort is normal.     Breath sounds: Normal breath sounds.  Abdominal:     Palpations: Abdomen is soft.     Tenderness: There is generalized abdominal tenderness. There is no right CVA tenderness or left CVA tenderness.  Skin:    General: Skin is warm and dry.  Neurological:     Mental Status: She is alert and oriented to person, place, and time.  Psychiatric:        Behavior: Behavior normal.      ED Treatments / Results  Labs (all labs ordered are listed, but only abnormal results are displayed) Labs Reviewed  COMPREHENSIVE METABOLIC PANEL - Abnormal; Notable for the following components:      Result Value   Glucose, Bld 199 (*)    Albumin 3.2 (*)    All other components within normal limits  URINALYSIS, ROUTINE W REFLEX MICROSCOPIC - Abnormal; Notable for the following components:   Color, Urine RED (*)    APPearance CLOUDY (*)    Glucose, UA >=500 (*)    Protein, ur >=300 (*)    Leukocytes,Ua LARGE (*)    Bacteria, UA MANY (*)    All other components within normal limits  CBG MONITORING, ED - Abnormal; Notable for the following components:   Glucose-Capillary 150 (*)    All other components within normal limits  URINE CULTURE  CBC WITH DIFFERENTIAL/PLATELET  LIPASE, BLOOD    EKG EKG Interpretation  Date/Time:  Tuesday March 30 2019 09:32:43 EDT Ventricular Rate:  110 PR Interval:    QRS Duration: 85 QT Interval:  324 QTC Calculation: 439 R Axis:   85 Text Interpretation:  Sinus tachycardia  Prominent P waves, nondiagnostic Borderline repolarization abnormality Confirmed by Quintella Reichert [phone]) on 03/30/2019 9:38:00 AM   Radiology Ct Abdomen Pelvis W Contrast  Result Date: 03/30/2019 CLINICAL DATA:  Abdominal pain, acute, generalized. Pain with nausea and vomiting over the past 3 weeks. Hyperglycemia. EXAM: CT ABDOMEN AND PELVIS WITH CONTRAST TECHNIQUE: Multidetector CT imaging of the abdomen and pelvis was performed using the standard protocol following bolus administration of intravenous contrast. CONTRAST:  178m OMNIPAQUE IOHEXOL 300 MG/ML  SOLN COMPARISON:  None. FINDINGS: Lower chest: The lung bases are clear without focal nodule, mass, or airspace disease. The heart size is normal. No significant pleural or pericardial effusion is present. Hepatobiliary: No focal liver abnormality is seen. No gallstones, gallbladder wall thickening, or biliary dilatation. Pancreas: Unremarkable. No pancreatic ductal dilatation or surrounding inflammatory changes. Spleen: Normal in size without focal abnormality. Adrenals/Urinary Tract: The adrenal glands are normal bilaterally. Kidneys and ureters are within normal limits. There is no stone or mass lesion. The urinary bladder is within normal limits. Stomach/Bowel: Stomach and duodenum are within normal limits. Small bowel is unremarkable. Terminal ileum is normal. The appendix is visualized and within normal limits. The ascending and transverse colon are normal. Descending and sigmoid colon are normal. There is normal stool volume throughout the colon. Vascular/Lymphatic: No significant vascular findings are present. No enlarged abdominal or pelvic lymph nodes. Reproductive: Uterus and bilateral adnexa are unremarkable. Other: Minimal free fluid in the anatomic pelvis is likely physiologic. No other free fluid or free air is present. There is no significant ventral hernia. Musculoskeletal: Vertebral body heights alignment are maintained. No acute  fracture traumatic subluxation is present. No focal lytic or blastic lesions are present. Mild leftward curvature is centered at L3-4. IMPRESSION: 1. No  acute or focal lesion to explain the patient's abdominal pain, nausea, or vomiting. 2. Normal CT appearance of the bowel. Electronically Signed   By: San Morelle M.D.   On: 03/30/2019 12:46    Procedures Procedures (including critical care time)  Medications Ordered in ED Medications  sodium chloride (PF) 0.9 % injection (has no administration in time range)  metoCLOPramide (REGLAN) injection 10 mg (has no administration in time range)  sodium chloride 0.9 % bolus 1,000 mL (0 mLs Intravenous Stopped 03/30/19 1040)  morphine 4 MG/ML injection 4 mg (4 mg Intravenous Given 03/30/19 1104)  LORazepam (ATIVAN) injection 0.5 mg (0.5 mg Intravenous Given 03/30/19 1144)  iohexol (OMNIPAQUE) 300 MG/ML solution 100 mL (100 mLs Intravenous Contrast Given 03/30/19 1202)  cefTRIAXone (ROCEPHIN) 1 g in sodium chloride 0.9 % 100 mL IVPB (0 g Intravenous Stopped 03/30/19 1330)     Initial Impression / Assessment and Plan / ED Course  I have reviewed the triage vital signs and the nursing notes.  Pertinent labs & imaging results that were available during my care of the patient were reviewed by me and considered in my medical decision making (see chart for details).  Clinical Course as of Mar 30 1427  Tue Mar 30, 2019  1411 47yo female with nausea and vomiting, onset 3 weeks ago when diagnosed with UTI, treated with Macrobid. Patient was seen in the ER yesterday, improved with antiemetics and was sent home. Patient returned to ER today after return of emesis at home. Patient with generalized mild abdominal tenderness. CT abdomen and pelvis is unremarkable, CBC and CMP without significant findings- glucose 199, improved with IV fluids, K/Co2 and gap WNL. Lipase WNL. UA suggests UTI and was treated with Rocephin. Denies marijuana use.  Patient hopeful for  dc home after PO challenge, however has failed PO challenge, continues to vomiting. Reglan ordered for vomiting, hospitalist paged for consult. Case discussed with Dr. Ralene Bathe, ER attending, agrees with plan of care. Patient is agreeable to stay.    [LM]  1428 Case discussed with hospitalist who will consult for admission.    [LM]    Clinical Course User Index [LM] Tacy Learn, PA-C      Final Clinical Impressions(s) / ED Diagnoses   Final diagnoses:  Lower urinary tract infectious disease  Intractable nausea and vomiting    ED Discharge Orders    None       Roque Lias 03/30/19 1429    Quintella Reichert, MD 04/01/19 [phone]

## 2019-03-30 NOTE — Plan of Care (Signed)
  Problem: Clinical Measurements: Goal: Respiratory complications will improve Outcome: Progressing   Problem: Clinical Measurements: Goal: Cardiovascular complication will be avoided Outcome: Progressing   Problem: Activity: Goal: Risk for activity intolerance will decrease Outcome: Progressing   Problem: Nutrition: Goal: Adequate nutrition will be maintained Outcome: Progressing   Problem: Coping: Goal: Level of anxiety will decrease Outcome: Progressing   

## 2019-03-30 NOTE — ED Notes (Signed)
hospitalist at bedside

## 2019-03-31 DIAGNOSIS — R112 Nausea with vomiting, unspecified: Secondary | ICD-10-CM

## 2019-03-31 LAB — CBC WITH DIFFERENTIAL/PLATELET
Abs Immature Granulocytes: 0.02 10*3/uL (ref 0.00–0.07)
Basophils Absolute: 0 10*3/uL (ref 0.0–0.1)
Basophils Relative: 0 %
Eosinophils Absolute: 0 10*3/uL (ref 0.0–0.5)
Eosinophils Relative: 1 %
HCT: 40.3 % (ref 36.0–46.0)
Hemoglobin: 13.3 g/dL (ref 12.0–15.0)
Immature Granulocytes: 0 %
Lymphocytes Relative: 45 %
Lymphs Abs: 3.4 10*3/uL (ref 0.7–4.0)
MCH: 29.4 pg (ref 26.0–34.0)
MCHC: 33 g/dL (ref 30.0–36.0)
MCV: 89.2 fL (ref 80.0–100.0)
Monocytes Absolute: 0.4 10*3/uL (ref 0.1–1.0)
Monocytes Relative: 5 %
Neutro Abs: 3.6 10*3/uL (ref 1.7–7.7)
Neutrophils Relative %: 49 %
Platelets: 238 10*3/uL (ref 150–400)
RBC: 4.52 MIL/uL (ref 3.87–5.11)
RDW: 12.8 % (ref 11.5–15.5)
WBC: 7.5 10*3/uL (ref 4.0–10.5)
nRBC: 0 % (ref 0.0–0.2)

## 2019-03-31 LAB — SARS CORONAVIRUS 2 (TAT 6-24 HRS): SARS Coronavirus 2: NEGATIVE

## 2019-03-31 LAB — COMPREHENSIVE METABOLIC PANEL
ALT: 16 U/L (ref 0–44)
AST: 21 U/L (ref 15–41)
Albumin: 2.6 g/dL — ABNORMAL LOW (ref 3.5–5.0)
Alkaline Phosphatase: 60 U/L (ref 38–126)
Anion gap: 7 (ref 5–15)
BUN: 10 mg/dL (ref 6–20)
CO2: 25 mmol/L (ref 22–32)
Calcium: 8.7 mg/dL — ABNORMAL LOW (ref 8.9–10.3)
Chloride: 115 mmol/L — ABNORMAL HIGH (ref 98–111)
Creatinine, Ser: 0.71 mg/dL (ref 0.44–1.00)
GFR calc Af Amer: >60 mL/min (ref >60)
GFR calc non Af Amer: >60 mL/min (ref >60)
Glucose, Bld: 142 mg/dL — ABNORMAL HIGH (ref 70–99)
Potassium: 3.8 mmol/L (ref 3.5–5.1)
Sodium: 147 mmol/L — ABNORMAL HIGH (ref 135–145)
Total Bilirubin: 1 mg/dL (ref 0.3–1.2)
Total Protein: 6.5 g/dL (ref 6.5–8.1)

## 2019-03-31 LAB — GLUCOSE, CAPILLARY
Glucose-Capillary: 166 mg/dL — ABNORMAL HIGH (ref 70–99)
Glucose-Capillary: 216 mg/dL — ABNORMAL HIGH (ref 70–99)
Glucose-Capillary: 258 mg/dL — ABNORMAL HIGH (ref 70–99)
Glucose-Capillary: 128 mg/dL — ABNORMAL HIGH (ref 70–99)

## 2019-03-31 LAB — URINE CULTURE

## 2019-03-31 MED ORDER — LACTATED RINGERS IV SOLN
INTRAVENOUS | Status: DC
  Administered 2019-03-31: 21:00:00 via INTRAVENOUS

## 2019-03-31 MED ORDER — PROCHLORPERAZINE EDISYLATE 10 MG/2ML IJ SOLN
5.0000 mg | Freq: Four times a day (QID) | INTRAMUSCULAR | Status: DC | PRN
  Administered 2019-03-31 – 2019-04-01 (×3): 5 mg via INTRAVENOUS
  Filled 2019-03-31 (×3): qty 2

## 2019-03-31 MED ORDER — INSULIN DEGLUDEC 200 UNIT/ML ~~LOC~~ SOPN
15.0000 [IU] | PEN_INJECTOR | Freq: Two times a day (BID) | SUBCUTANEOUS | Status: DC
  Administered 2019-03-31 – 2019-04-01 (×2): 16 [IU] via SUBCUTANEOUS

## 2019-03-31 MED ORDER — LORAZEPAM 2 MG/ML IJ SOLN
0.5000 mg | Freq: Once | INTRAMUSCULAR | Status: AC
  Administered 2019-03-31: 01:00:00 0.5 mg via INTRAVENOUS
  Filled 2019-03-31: qty 1

## 2019-03-31 NOTE — Progress Notes (Signed)
Mid level provider notified of pt persistent n/v and anxiety. Pt only has po ativan ordered at this time. Pt just states that she feels bad overall. Rn encouraging pt to rest and relax. Rn will continue to monitor.

## 2019-03-31 NOTE — Progress Notes (Signed)
PROGRESS NOTE    [name]  BJY:[id number] DOB: 05-26-1971 DOA: 03/30/2019 PCP: Everardo Beals, NP   Brief Narrative:  [name] is [name] 48 year old female with past medical history significant for chronic anxiety, type 1 diabetes who presented to [facility] ED (once more after multiple ER visits for the same) due to persistent nausea and vomiting of 3 weeks duration.  Patient states she was diagnosed with UTI 3 weeks ago at urgent care was prescribed antibiotics which she has not been able to keep down due to constant nausea and vomiting.  She has been vomiting daily since.  Nausea and vomiting are associated with weight loss, not quantified, and epigastric pain.  No diarrhea.  She denies fevers, chills, or night sweats.  Denies known exposure to COVID-19.  No cardiopulmonary symptoms.  No marijuana or illicit drug use.  She has not been seen by GI in the past.  Not on PPI.  ED Course: In the ED, vital signs significant for elevated blood pressure and sinus tachycardia.  Lab studies remarkable for hyperglycemia with no metabolic acidosis in the setting of type 1 diabetes.  Lipase level normal.  UA remarkable for pyuria.  Unable to pass p.o. challenge in the ED.  TRH asked to admit for intractable nausea and vomiting, unable to tolerate p.o.  Assessment & Plan:   Active Problems:   Vomiting   Intractable nausea and vomiting, unclear etiology Unable to tolerate p.o. intake for the past 3 weeks Failed p.o. challenge in the ED Continue IVF Start IV antiemetics, IV compazine prn IV PPI Gastric emptying study given dm  Dehydration in the setting of poor oral intake Continue IVF  Recently diagnosed UTI Was prescribed antibiotics but was unable to tolerate p.o. UA positive for pyuria Urine culture with multiple species Will d/c abx  Type 1 diabetes with hyperglycemia States she continued to take her insulin despite nausea and vomiting Obtain hemoglobin A1c  13.6 Increase degludec to 16 units BID Continue SSI Watch for hypoglycemia, but BG starting to rise  Generalized anxiety No antianxiety medication from list of meds Patient stated she was taking Cymbalta at home, please verify with pharmacy before restarting Healed wounds in lower extremities bilaterally from skin picking as revealed by the patient.  DVT prophylaxis: lovenox Code Status: full  Family Communication: none at bedside Disposition Plan: pending further improvement in ability to take PO and nausea/vomiting as well as pending gastric emptying study   Consultants:   none  Procedures:   none  Antimicrobials:  Anti-infectives (From admission, onward)   Start     Dose/Rate Route Frequency Ordered Stop   03/31/19 1300  cefTRIAXone (ROCEPHIN) 1 g in sodium chloride 0.9 % 100 mL IVPB     1 g 200 mL/hr over 30 Minutes Intravenous Every 24 hours 03/30/19 1627     03/30/19 1215  cefTRIAXone (ROCEPHIN) 1 g in sodium chloride 0.9 % 100 mL IVPB     1 g 200 mL/hr over 30 Minutes Intravenous  Once 03/30/19 1203 03/30/19 1330        Subjective: Feels better. Not back to baseline, but nausea, vomiting improved.  Objective: Vitals:   03/30/19 2150 03/31/19 0510 03/31/19 1734 03/31/19 1734  BP: 140/84 128/84 (!) 150/92 (!) 150/92  Pulse: (!) 103 97 100 98  Resp: $Remo'17 17 18 18  'PNQBw$ Temp: 98.8 F (37.1 C) 99.7 F (37.6 C) 98.5 F (36.9 C) 98.5 F (36.9 C)  TempSrc: Oral Oral Oral Oral  SpO2:  98% 100% 100%  Weight:      Height:        Intake/Output Summary (Last 24 hours) at 03/31/2019 1938 Last data filed at 03/31/2019 1818 Gross per 24 hour  Intake 2141.18 ml  Output 300 ml  Net 1841.18 ml   Filed Weights   03/30/19 0902  Weight: 77.1 kg    Examination:  General exam: Appears calm and comfortable  Respiratory system: Clear to auscultation. Respiratory effort normal. Cardiovascular system: S1 & S2 heard, RRR.  Gastrointestinal system: Abdomen is  nondistended, soft and nontender. Central nervous system: Alert and oriented. No focal neurological deficits. Extremities: no LEE Skin: No rashes, lesions or ulcers Psychiatry: Judgement and insight appear normal. Mood & affect appropriate.     Data Reviewed: I have personally reviewed following labs and imaging studies  CBC: Recent Labs  Lab 03/29/19 1257 03/29/19 1302 03/30/19 0946 03/31/19 0545  WBC 8.4  --  8.7 7.5  NEUTROABS 6.9  --  6.6 3.6  HGB 15.2* 15.3*   15.6* 13.6 13.3  HCT 43.3 45.0   46.0 42.1 40.3  MCV 83.9  --  92.1 89.2  PLT 300  --  247 003   Basic Metabolic Panel: Recent Labs  Lab 03/29/19 1257 03/29/19 1302 03/30/19 0946 03/31/19 0545  NA 138 139   139 145 147*  K 3.6 3.6   3.6 3.5 3.8  CL 95* 97* 108 115*  CO2 29  --  25 25  GLUCOSE 493* 487* 199* 142*  BUN 21* 23* 14 10  CREATININE 1.10* 0.90 0.77 0.71  CALCIUM 10.3  --  9.8 8.7*   GFR: Estimated Creatinine Clearance: 99.2 mL/min (by C-G formula based on SCr of 0.71 mg/dL). Liver Function Tests: Recent Labs  Lab 03/29/19 1257 03/30/19 0946 03/31/19 0545  AST $Re'23 23 21  'SIF$ ALT $R'20 19 16  'yn$ ALKPHOS 94 73 60  BILITOT 1.2 0.8 1.0  PROT 8.0 7.3 6.5  ALBUMIN 3.4* 3.2* 2.6*   Recent Labs  Lab 03/29/19 1257 03/30/19 0946  LIPASE 27 22   No results for input(s): AMMONIA in the last 168 hours. Coagulation Profile: No results for input(s): INR, PROTIME in the last 168 hours. Cardiac Enzymes: No results for input(s): CKTOTAL, CKMB, CKMBINDEX, TROPONINI in the last 168 hours. BNP (last 3 results) No results for input(s): PROBNP in the last 8760 hours. HbA1C: Recent Labs    03/30/19 0946  HGBA1C 13.6*   CBG: Recent Labs  Lab 03/30/19 1751 03/30/19 2152 03/31/19 0755 03/31/19 1157 03/31/19 1735  GLUCAP 120* 149* 166* 216* 258*   Lipid Profile: No results for input(s): CHOL, HDL, LDLCALC, TRIG, CHOLHDL, LDLDIRECT in the last 72 hours. Thyroid Function Tests: No results for  input(s): TSH, T4TOTAL, FREET4, T3FREE, THYROIDAB in the last 72 hours. Anemia Panel: No results for input(s): VITAMINB12, FOLATE, FERRITIN, TIBC, IRON, RETICCTPCT in the last 72 hours. Sepsis Labs: Recent Labs  Lab 03/29/19 1253 03/29/19 1447  LATICACIDVEN 2.7* 2.4*    Recent Results (from the past 240 hour(s))  Urine culture     Status: Abnormal   Collection Time: 03/30/19  2:16 PM   Specimen: Urine, Random  Result Value Ref Range Status   Specimen Description   Final    URINE, RANDOM Performed at [facility] [address], [place], [place] [address]    Special Requests   Final    NONE Performed at [facility], [employer] [place] [address] [place], [place] [address]  Culture MULTIPLE SPECIES PRESENT, SUGGEST RECOLLECTION (Melea Prezioso)  Final   Report Status 03/31/2019 FINAL  Final  SARS CORONAVIRUS 2 (TAT 6-12 HRS) Nasal Swab Aptima Multi Swab     Status: None   Collection Time: 03/30/19  3:59 PM   Specimen: Aptima Multi Swab; Nasal Swab  Result Value Ref Range Status   SARS Coronavirus 2 NEGATIVE NEGATIVE Final    Comment: (NOTE) SARS-CoV-2 target nucleic acids are NOT DETECTED. The SARS-CoV-2 RNA is generally detectable in upper and lower respiratory specimens during the acute phase of infection. Negative results do not preclude SARS-CoV-2 infection, do not rule out co-infections with other pathogens, and should not be used as the sole basis for treatment or other patient management decisions. Negative results must be combined with clinical observations, patient history, and epidemiological information. The expected result is Negative. Fact Sheet for Patients: SugarRoll.be Fact Sheet for Healthcare Providers: https://www.woods-mathews.com/ This test is not yet approved or cleared by the [place] FDA and  has been authorized for detection and/or diagnosis of SARS-CoV-2 by FDA under an Emergency Use  Authorization (EUA). This EUA will remain  in effect (meaning this test can be used) for the duration of the COVID-19 declaration under Section 56 4(b)(1) of the Act, 21 U.S.C. section 360bbb-3(b)(1), unless the authorization is terminated or revoked sooner. Performed at [facility], [place] [address] [place], [place] [address]          Radiology Studies: Ct Abdomen Pelvis W Contrast  Result Date: 03/30/2019 CLINICAL DATA:  Abdominal pain, acute, generalized. Pain with nausea and vomiting over the past 3 weeks. Hyperglycemia. EXAM: CT ABDOMEN AND PELVIS WITH CONTRAST TECHNIQUE: Multidetector CT imaging of the abdomen and pelvis was performed using the standard protocol following bolus administration of intravenous contrast. CONTRAST:  141mL OMNIPAQUE IOHEXOL 300 MG/ML  SOLN COMPARISON:  None. FINDINGS: Lower chest: The lung bases are clear without focal nodule, mass, or airspace disease. The heart size is normal. No significant pleural or pericardial effusion is present. Hepatobiliary: No focal liver abnormality is seen. No gallstones, gallbladder wall thickening, or biliary dilatation. Pancreas: Unremarkable. No pancreatic ductal dilatation or surrounding inflammatory changes. Spleen: Normal in size without focal abnormality. Adrenals/Urinary Tract: The adrenal glands are normal bilaterally. Kidneys and ureters are within normal limits. There is no stone or mass lesion. The urinary bladder is within normal limits. Stomach/Bowel: Stomach and duodenum are within normal limits. Small bowel is unremarkable. Terminal ileum is normal. The appendix is visualized and within normal limits. The ascending and transverse colon are normal. Descending and sigmoid colon are normal. There is normal stool volume throughout the colon. Vascular/Lymphatic: No significant vascular findings are present. No enlarged abdominal or pelvic lymph nodes. Reproductive: Uterus and bilateral adnexa are unremarkable. Other:  Minimal free fluid in the anatomic pelvis is likely physiologic. No other free fluid or free air is present. There is no significant ventral hernia. Musculoskeletal: Vertebral body heights alignment are maintained. No acute fracture traumatic subluxation is present. No focal lytic or blastic lesions are present. Mild leftward curvature is centered at L3-4. IMPRESSION: 1. No acute or focal lesion to explain the patient's abdominal pain, nausea, or vomiting. 2. Normal CT appearance of the bowel. Electronically Signed   By: San Morelle M.D.   On: 03/30/2019 12:46        Scheduled Meds:  enoxaparin (LOVENOX) injection  40 mg Subcutaneous Q24H   insulin aspart  0-5 Units Subcutaneous QHS   insulin aspart  0-9 Units  Subcutaneous TID WC   Insulin Degludec  10 Units Subcutaneous BID   pantoprazole (PROTONIX) IV  40 mg Intravenous Q12H   Continuous Infusions:  cefTRIAXone (ROCEPHIN)  IV 1 g (03/31/19 1244)     LOS: 1 day    Time spent: over 16 min    Fayrene Helper, MD [facility] Pager AMION  If 7PM-7AM, please contact night-coverage [email or web address] Password [facility] 03/31/2019, 7:38 PM

## 2019-03-31 NOTE — Progress Notes (Signed)
Pt continues to have nausea and vomiting when awake though the frequency has improved. Pt was able to rest after Ativan and compazine were given earlier in shift. No needs at this time. No changes to note overall. rn will continue to monitor.

## 2019-04-01 ENCOUNTER — Inpatient Hospital Stay (HOSPITAL_COMMUNITY): Payer: BC Managed Care – PPO

## 2019-04-01 LAB — MAGNESIUM: Magnesium: 2 mg/dL (ref 1.7–2.4)

## 2019-04-01 LAB — GLUCOSE, CAPILLARY
Glucose-Capillary: 128 mg/dL — ABNORMAL HIGH (ref 70–99)
Glucose-Capillary: 126 mg/dL — ABNORMAL HIGH (ref 70–99)
Glucose-Capillary: 108 mg/dL — ABNORMAL HIGH (ref 70–99)

## 2019-04-01 LAB — CBC
HCT: 36.8 % (ref 36.0–46.0)
Hemoglobin: 12 g/dL (ref 12.0–15.0)
MCH: 29.6 pg (ref 26.0–34.0)
MCHC: 32.6 g/dL (ref 30.0–36.0)
MCV: 90.6 fL (ref 80.0–100.0)
Platelets: 219 10*3/uL (ref 150–400)
RBC: 4.06 MIL/uL (ref 3.87–5.11)
RDW: 12.5 % (ref 11.5–15.5)
WBC: 8.6 10*3/uL (ref 4.0–10.5)
nRBC: 0 % (ref 0.0–0.2)

## 2019-04-01 LAB — COMPREHENSIVE METABOLIC PANEL
ALT: 16 U/L (ref 0–44)
AST: 17 U/L (ref 15–41)
Albumin: 2.6 g/dL — ABNORMAL LOW (ref 3.5–5.0)
Alkaline Phosphatase: 60 U/L (ref 38–126)
Anion gap: 9 (ref 5–15)
BUN: 6 mg/dL (ref 6–20)
CO2: 23 mmol/L (ref 22–32)
Calcium: 8.3 mg/dL — ABNORMAL LOW (ref 8.9–10.3)
Chloride: 108 mmol/L (ref 98–111)
Creatinine, Ser: 0.59 mg/dL (ref 0.44–1.00)
GFR calc Af Amer: >60 mL/min (ref >60)
GFR calc non Af Amer: >60 mL/min (ref >60)
Glucose, Bld: 135 mg/dL — ABNORMAL HIGH (ref 70–99)
Potassium: 3.2 mmol/L — ABNORMAL LOW (ref 3.5–5.1)
Sodium: 140 mmol/L (ref 135–145)
Total Bilirubin: 0.6 mg/dL (ref 0.3–1.2)
Total Protein: 6 g/dL — ABNORMAL LOW (ref 6.5–8.1)

## 2019-04-01 MED ORDER — PROMETHAZINE HCL 12.5 MG PO TABS: 12.5000 mg | ORAL_TABLET | Freq: Three times a day (TID) | ORAL | 0 refills | Status: DC | PRN

## 2019-04-01 MED ORDER — ACETAMINOPHEN 325 MG PO TABS: 650.0000 mg | ORAL_TABLET | Freq: Four times a day (QID) | ORAL | Status: DC | PRN

## 2019-04-01 MED ORDER — TECHNETIUM TC 99M SULFUR COLLOID
2.0000 | Freq: Once | INTRAVENOUS | Status: AC | PRN
  Administered 2019-04-01: 09:00:00 2 via INTRAVENOUS

## 2019-04-01 MED ORDER — PANTOPRAZOLE SODIUM 40 MG PO TBEC: 40.0000 mg | DELAYED_RELEASE_TABLET | Freq: Every day | ORAL | 0 refills | Status: DC

## 2019-04-01 MED ORDER — ACETAMINOPHEN 325 MG PO TABS
ORAL_TABLET | ORAL | Status: AC
  Administered 2019-04-01: 02:00:00 650 mg
  Filled 2019-04-01: qty 2

## 2019-04-01 MED ORDER — POTASSIUM CHLORIDE CRYS ER 20 MEQ PO TBCR
40.0000 meq | EXTENDED_RELEASE_TABLET | Freq: Once | ORAL | Status: AC
  Administered 2019-04-01: 12:00:00 40 meq via ORAL
  Filled 2019-04-01: qty 2

## 2019-04-01 NOTE — Discharge Summary (Signed)
**Note De-Identified vi Obfusction** Physicin Dischrge Summry  Cl Kruckenberg NOB:[id number] DOB: 11/28/1970 DOA: 03/30/2019  PCP: Everrdo Bels, NP  Admit dte: 03/30/2019 Dischrge dte: 04/01/2019  Time spent: 40 minutes  Recommendtions for Outptient Follow-up:  1. Follow outptient CBC/CMP 2. Continue to follow blood sugrs nd insulin needs 3. Estblish with PCP outptient  4. Consider GI f/u if symptoms persistent/recurrent 5. Consider repet UA outptient (microscopic hemturi), pt sx from UTI stndpoint nd grew multiple species (lso with microscopic hemturi) - discussed with pt  Dischrge Dignoses:  Active Problems:   Vomiting   Intrctble nuse nd vomiting   Dischrge Condition: stble  Diet recommendtion: dibetic diet  Filed Weights   03/30/19 0902  Weight: 77.1 kg    History of present illness:  [name] is  35 yer old femle with pst medicl history significnt for chronic nxiety, type 1 dibetes who presented to [facility] ED (once more fter multiple ER visits for the sme) due to persistent nuse nd vomiting of 3 weeks durtion.  Ptient sttes she ws dignosed with UTI 3 weeks go t urgent cre ws prescribed ntibiotics which she hs not been ble to keep down due to constnt nuse nd vomiting.  She hs been vomiting dily since.  Nuse nd vomiting re ssocited with weight loss, not quntified, nd epigstric pin.  No dirrhe.  She denies fevers, chills, or night swets.  Denies known exposure to COVID-19.  No crdiopulmonry symptoms.  No mrijun or illicit drug use.  She hs not been seen by GI in the pst.  Not on PPI.  ED Course: In the ED, vitl signs significnt for elevted blood pressure nd sinus tchycrdi.  Lb studies remrkble for hyperglycemi with no metbolic cidosis in the setting of type 1 dibetes.  Lipse level norml.  UA remrkble for pyuri.  Unble to pss p.o. chllenge in the ED.  TRH sked to dmit for intrctble  nuse nd vomiting, unble to tolerte p.o.  Admitted for nuse/vomiting x3 weeks.  She hd gstric emptying study which ws norml.  She improved on the dy of dischrge nd ws dischrged with plns for outptient f/u nd estblish with PCP.  See below for dditionl detils  Hospitl Course:  Intrctble nuse nd vomiting, uncler etiology Unble to tolerte p.o. intkefor the pst 3 weeks Improved tody on dy of dischrge.  D/c with prn ntiemetics.   Tolerted diet on dy of dischrge.  Dehydrtion in the setting of poor orl intke Continue IVF  Recently dignosed UTI Ws prescribed ntibiotics but ws unble to tolerte p.o. UA positive for pyuri Urine culture with multiple species Abx d/c'd Follow outptient - consider repet UA Discussed with pt  Type 1 dibetes with hyperglycemi Sttes she continued to tke her insulin despite nuse nd vomiting Obtin hemoglobin A1c 13.6 Continue home insulin  Generlized nxiety Not on ntinxiety med Follow outptient  Procedures:  Gstric emptying study  Consulttions:  none  Dischrge Exm: Vitls:   04/01/19 0522 04/01/19 1435  BP: (!) 174/112 (!) 143/92  Pulse: (!) 108 (!) 103  Resp: 19 18  Temp: 98.1 F (36.7 C) 98.2 F (36.8 C)  SpO2: 100% 100%   Feels well, redy to go home  Generl: No cute distress. Crdiovsculr: Hert sounds show  regulr rte, nd rhythm. No gllops or rubs. No murmurs. No JVD. Lungs: Cler to usculttion bilterlly with good ir movement. No rles, rhonchi or wheezes. Abdomen: Soft, nontender, nondistended with norml ctive bowel sounds. No msses. No heptosplenomegly. Neurologicl: Alert nd oriented **Note De-Identified vi Obfusction** 3. Moves ll extremities 4 with equl strength. Crnil nerves II through XII grossly intct. Skin: Wrm nd dry. No rshes or lesions. Extremities: No clubbing or cynosis. No edem.    Dischrge Instructions   Dischrge Instructions    Cll MD for:   difficulty brething, hedche or visul disturbnces   Complete by: As directed    Cll MD for:  extreme ftigue   Complete by: As directed    Cll MD for:  persistnt dizziness or light-hededness   Complete by: As directed    Cll MD for:  persistnt nuse nd vomiting   Complete by: As directed    Cll MD for:  severe uncontrolled pin   Complete by: As directed    Cll MD for:  temperture >100.4   Complete by: As directed    Diet - low sodium hert helthy   Complete by: As directed    Dischrge instructions   Complete by: As directed    You were seen for nuse nd vomiting.  This is now improved.    It's not cler wht cused your symptoms.  You hd  gstric emptying study which ws norml.  We'll dischrge you with phenergn to use s needed.    Plese follow up nd estblish with  primry cre provider.  Wtch your blood sugrs.  Return for new, recurrent, or worsening symptoms.  Plese sk your PCP to request records from this hospitliztion so they know wht ws done nd wht the next steps will be.   Increse ctivity slowly   Complete by: As directed      Allergies s of 04/01/2019      Rections   Amoxicillin Itching   Gbpentin Other (See Comments)   Dizziness   Lntus [insulin Glrgine] Itching, Rsh      Mediction List    STOP tking these medictions   ondnsetron 4 MG disintegrting tblet Commonly known s: Zofrn ODT     TAKE these medictions   ccu-chek soft touch lncets Check 3-4 times dily for uncontrolled insulin dependent type 1 dibetes   blood glucose meter kit nd supplies Dispense bsed on ptient nd insurnce preference. Use up to four times dily s directed. (FOR ICD-9 250.00, 250.01).   diclofenc sodium 1 % Gel Commonly known s: Voltren Apply 2 g topiclly 4 (four) times dily.   glucgon 1 MG injection Commonly known s: Glucgon Emergency Inject 1 mg into the muscle once s needed.   glucose blood test  strip Commonly known s: ACCU-CHEK ACTIVE STRIPS Check 3-4 times dily for uncontrolled insulin dependent type 1 dibetes   hydrALAZINE 50 MG tblet Commonly known s: APRESOLINE Tke 50 mg by mouth 3 (three) times dily.   insulin sprt 100 UNIT/ML FlexPen Commonly known s: NovoLOG FlexPen Inject 8 units into the skin t brekfst, 12 units t lunch nd 8 units t dinner. Wht chnged:   how much to tke  how to tke this  when to tke this  dditionl instructions   lctulose 10 GM/15ML solution Commonly known s: CHRONULAC Tke 15 mLs by mouth dily s needed for constiption.   pntoprzole 40 MG tblet Commonly known s: Protonix Tke 1 tblet (40 mg totl) by mouth dily.   promethzine 12.5 MG tblet Commonly known s: PHENERGAN Tke 1 tblet (12.5 mg totl) by mouth every 8 (eight) hours s needed for up to 3 dys for nuse or vomiting.   RA One Dily Gummy Vites Chew Chew 1 tblet by mouth **Note De-Identified vi Obfusction** dily.   Tyler As FlexTouch 200 UNIT/ML Sopn Generic drug: Insulin Degludec Inject 25 Units into the skin 2 (two) times dily.   VITAMELTS ENERGY VITAMIN B-12 PO Tke 1 tblet by mouth every Thursdy.      Allergies  Allergen Rections  . Amoxicillin Itching  . Gbpentin Other (See Comments)    Dizziness  . Lntus [Insulin Glrgine] Itching nd Rsh      The results of significnt dignostics from this hospitliztion (including imging, microbiology, ncillry nd lbortory) re listed below for reference.    Significnt Dignostic Studies: Nm Gstric Emptying  Result Dte: 04/01/2019 CLINICAL DATA:  Abdominl pin with nuse nd vomiting EXAM: NUCLEAR MEDICINE GASTRIC EMPTYING SCAN TECHNIQUE: After orl ingestion of rdiolbeled mel, sequentil bdominl imges were obtined for 120 minutes. Residul percentge of ctivity remining within the stomch ws clculted t 60 nd 120 minutes. RADIOPHARMACEUTICALS:  2.0 mCi Tc-38msulfur colloid in  stndrdized mel including egg COMPARISON:  None. FINDINGS: Expected loction of the stomch in the left upper qudrnt. Ingested mel empties the stomch grdully over the course of the study with 30.8% retention t 60 min nd 5.9% retention t 120 min (norml retention less thn 30% t  120 min). IMPRESSION: Norml gstric emptying study. Electroniclly Signed   By: WLowell GripIII M.D.   On: 04/01/2019 11:28   Ct Abdomen Pelvis W Contrst  Result Dte: 03/30/2019 CLINICAL DATA:  Abdominl pin, cute, generlized. Pin with nuse nd vomiting over the pst 3 weeks. Hyperglycemi. EXAM: CT ABDOMEN AND PELVIS WITH CONTRAST TECHNIQUE: Multidetector CT imging of the bdomen nd pelvis ws performed using the stndrd protocol following bolus dministrtion of intrvenous contrst. CONTRAST:  1043mOMNIPAQUE IOHEXOL 300 MG/ML  SOLN COMPARISON:  None. FINDINGS: Lower chest: The lung bses re cler without focl nodule, mss, or irspce disese. The hert size is norml. No significnt pleurl or pericrdil effusion is present. Heptobiliry: No focl liver bnormlity is seen. No gllstones, gllbldder wll thickening, or biliry dilttion. Pncres: Unremrkble. No pncretic ductl dilttion or surrounding inflmmtory chnges. Spleen: Norml in size without focl bnormlity. Adrenls/Urinry Trct: The drenl glnds re norml bilterlly. Kidneys nd ureters re within norml limits. There is no stone or mss lesion. The urinry bldder is within norml limits. Stomch/Bowel: Stomch nd duodenum re within norml limits. Smll bowel is unremrkble. Terminl ileum is norml. The ppendix is visulized nd within norml limits. The scending nd trnsverse colon re norml. Descending nd sigmoid colon re norml. There is norml stool volume throughout the colon. Vsculr/Lymphtic: No significnt vsculr findings re present. No enlrged bdominl or pelvic lymph nodes. Reproductive:  Uterus nd bilterl dnex re unremrkble. Other: Miniml free fluid in the ntomic pelvis is likely physiologic. No other free fluid or free ir is present. There is no significnt ventrl herni. Musculoskeletl: Vertebrl body heights lignment re mintined. No cute frcture trumtic subluxtion is present. No focl lytic or blstic lesions re present. Mild leftwrd curvture is centered t L3-4. IMPRESSION: 1. No cute or focl lesion to explin the ptient's bdominl pin, nuse, or vomiting. 2. Norml CT ppernce of the bowel. Electroniclly Signed   By: ChSn Morelle.D.   On: 03/30/2019 12:46    Microbiology: Recent Results (from the pst 240 hour(s))  Urine culture     Sttus: Abnorml   Collection Time: 03/30/19  2:16 PM   Specimen: Urine, Rndom  Result Vlue Ref Rnge Sttus   Specimen Description   Finl    URINE, RANDOM Performed t [facility]  [place] [address] [place], [place] [address]    Special Requests   Final    NONE Performed at [facility], [place] [address] [place], [place] [address]    Culture MULTIPLE SPECIES PRESENT, SUGGEST RECOLLECTION ()  Final   Report Status 03/31/2019 FINL  Final  SRS CORONVIRUS 2 (TT 6-12 HRS) Nasal Swab ptima Multi Swab     Status: None   Collection Time: 03/30/19  3:59 PM   Specimen: ptima Multi Swab; Nasal Swab  Result Value Ref Range Status   SRS Coronavirus 2 NEGTIVE NEGTIVE Final    Comment: (NOTE) SRS-CoV-2 target nucleic acids are NOT DETECTED. The SRS-CoV-2 RN is generally detectable in upper and lower respiratory specimens during the acute phase of infection. Negative results do not preclude SRS-CoV-2 infection, do not rule out co-infections with other pathogens, and should not be used as the sole basis for treatment or other patient management decisions. Negative results must be combined with clinical observations, patient history, and epidemiological  information. The expected result is Negative. Fact Sheet for Patients: SugarRoll.be Fact Sheet for Healthcare Providers: https://www.woods-mathews.com/ This test is not yet approved or cleared by the [place] [facility] and  has been authorized for detection and/or diagnosis of SRS-CoV-2 by FD under an Emergency Use uthorization (EU). This EU will remain  in effect (meaning this test can be used) for the duration of the COVID-19 declaration under Section 56 4(b)(1) of the ct, 21 U.S.C. section 360bbb-3(b)(1), unless the authorization is terminated or revoked sooner. Performed at [facility], [place] [address] [place], [place] [address]      Labs: Basic Metabolic Panel: Recent Labs  Lab 03/29/19 1257 03/29/19 1302 03/30/19 0946 03/31/19 0545 04/01/19 0528  N 138 139  139 145 147* 140  K 3.6 3.6  3.6 3.5 3.8 3.2*  CL 95* 97* 108 115* 108  CO2 29  --  '25 25 23  ' GLUCOSE 493* 487* 199* 142* 135*  BUN 21* 23* '14 10 6  ' CRETININE 1.10* 0.90 0.77 0.71 0.59  CLCIUM 10.3  --  9.8 8.7* 8.3*  MG  --   --   --   --  2.0   Liver Function Tests: Recent Labs  Lab 03/29/19 1257 03/30/19 0946 03/31/19 0545 04/01/19 0528  ST '23 23 21 17  ' LT '20 19 16 16  ' LKPHOS 94 73 60 60  BILITOT 1.2 0.8 1.0 0.6  PROT 8.0 7.3 6.5 6.0*  LBUMIN 3.4* 3.2* 2.6* 2.6*   Recent Labs  Lab 03/29/19 1257 03/30/19 0946  LIPSE 27 22   No results for input(s): MMONI in the last 168 hours. CBC: Recent Labs  Lab 03/29/19 1257 03/29/19 1302 03/30/19 0946 03/31/19 0545 04/01/19 0528  WBC 8.4  --  8.7 7.5 8.6  NEUTROBS 6.9  --  6.6 3.6  --   HGB 15.2* 15.3*  15.6* 13.6 13.3 12.0  HCT 43.3 45.0  46.0 42.1 40.3 36.8  MCV 83.9  --  92.1 89.2 90.6  PLT 300  --  247 238 219   Cardiac Enzymes: No results for input(s): CKTOTL, CKMB, CKMBINDEX, TROPONINI in the last 168 hours. BNP: BNP (last 3 results) No results for input(s): BNP in  the last 8760 hours.  ProBNP (last 3 results) No results for input(s): PROBNP in the last 8760 hours.  CBG: Recent Labs  Lab 03/31/19 1735 03/31/19 2117 04/01/19 0528 04/01/19 0723 04/01/19 1218  GLUCP 258* 128* 128* 126*  108*       Signed:  Fayrene Helper MD.  [facility] 04/01/2019, 3:22 PM

## 2019-04-01 NOTE — Progress Notes (Signed)
Pt discharged home in stable condition. Discharge instructions given. Scripts sent to pharmacy of choice. No immediate questions or concerns at this time. Pt discharged from unit via wheelchair. 

## 2019-04-02 NOTE — Progress Notes (Signed)
Patient's chart accessed for discharge related questions and concerns.

## 2019-04-23 ENCOUNTER — Encounter: Payer: Self-pay | Admitting: Family Medicine

## 2019-04-23 ENCOUNTER — Encounter: Payer: Self-pay | Admitting: *Deleted

## 2019-04-23 ENCOUNTER — Other Ambulatory Visit: Payer: Self-pay

## 2019-04-23 ENCOUNTER — Ambulatory Visit: Payer: BC Managed Care – PPO | Attending: Family Medicine | Admitting: Family Medicine

## 2019-04-23 VITALS — BP 137/86 | HR 104 | Temp 98.3°F | Ht 72.0 in | Wt 167.2 lb

## 2019-04-23 DIAGNOSIS — Z23 Encounter for immunization: Secondary | ICD-10-CM

## 2019-04-23 DIAGNOSIS — E1040 Type 1 diabetes mellitus with diabetic neuropathy, unspecified: Secondary | ICD-10-CM | POA: Diagnosis not present

## 2019-04-23 DIAGNOSIS — R000 Tachycardia, unspecified: Secondary | ICD-10-CM

## 2019-04-23 DIAGNOSIS — N390 Urinary tract infection, site not specified: Secondary | ICD-10-CM

## 2019-04-23 DIAGNOSIS — N3001 Acute cystitis with hematuria: Secondary | ICD-10-CM

## 2019-04-23 DIAGNOSIS — Z7689 Persons encountering health services in other specified circumstances: Secondary | ICD-10-CM

## 2019-04-23 DIAGNOSIS — R739 Hyperglycemia, unspecified: Secondary | ICD-10-CM

## 2019-04-23 DIAGNOSIS — R112 Nausea with vomiting, unspecified: Secondary | ICD-10-CM

## 2019-04-23 DIAGNOSIS — Z1239 Encounter for other screening for malignant neoplasm of breast: Secondary | ICD-10-CM

## 2019-04-23 DIAGNOSIS — Z09 Encounter for follow-up examination after completed treatment for conditions other than malignant neoplasm: Secondary | ICD-10-CM

## 2019-04-23 LAB — POCT URINALYSIS DIP (CLINITEK)
Bilirubin, UA: NEGATIVE
Glucose, UA: 500 mg/dL — AB
Ketones, POC UA: NEGATIVE mg/dL
Leukocytes, UA: NEGATIVE
Nitrite, UA: POSITIVE — AB
POC PROTEIN,UA: >=300 — AB
Spec Grav, UA: 1.015
Urobilinogen, UA: 0.2 U/dL
pH, UA: 5.5

## 2019-04-23 LAB — GLUCOSE, POCT (MANUAL RESULT ENTRY): POC Glucose: 468 mg/dL — AB (ref 70–99)

## 2019-04-23 MED ORDER — LEVOFLOXACIN 500 MG PO TABS: 500.0000 mg | ORAL_TABLET | Freq: Every day | ORAL | 0 refills | Status: DC

## 2019-04-23 MED ORDER — INSULIN ASPART 100 UNIT/ML ~~LOC~~ SOLN: 12.0000 [IU] | Freq: Once | SUBCUTANEOUS | Status: DC

## 2019-04-23 MED ORDER — ONETOUCH VERIO W/DEVICE KIT: PACK | 0 refills | Status: DC

## 2019-04-23 MED ORDER — ONETOUCH DELICA LANCETS 33G MISC: 99 refills | Status: DC

## 2019-04-23 MED ORDER — NOVOLOG FLEXPEN 100 UNIT/ML ~~LOC~~ SOPN: PEN_INJECTOR | SUBCUTANEOUS | 3 refills | Status: DC

## 2019-04-23 MED ORDER — ONETOUCH VERIO VI STRP: ORAL_STRIP | 12 refills | Status: DC

## 2019-04-23 MED ORDER — PANTOPRAZOLE SODIUM 40 MG PO TBEC: 40.0000 mg | DELAYED_RELEASE_TABLET | Freq: Every day | ORAL | 3 refills | Status: DC

## 2019-04-23 MED ORDER — TRESIBA FLEXTOUCH 200 UNIT/ML ~~LOC~~ SOPN: 35.0000 [IU] | PEN_INJECTOR | Freq: Two times a day (BID) | SUBCUTANEOUS | 3 refills | Status: AC

## 2019-04-23 MED ORDER — ONDANSETRON HCL 4 MG PO TABS: 4.0000 mg | ORAL_TABLET | Freq: Three times a day (TID) | ORAL | 3 refills | Status: DC | PRN

## 2019-04-23 NOTE — Patient Instructions (Signed)
Gastroparesis ° °Gastroparesis is a condition in which food takes longer than normal to empty from the stomach. The condition is usually long-lasting (chronic). It may also be called delayed gastric emptying. °There is no cure, but there are treatments and things that you can do at home to help relieve symptoms. Treating the underlying condition that causes gastroparesis can also help relieve symptoms. °What are the causes? °In many cases, the cause of this condition is not known. Possible causes include: °· A hormone (endocrine) disorder, such as hypothyroidism or diabetes. °· A nervous system disease, such as Parkinson's disease or multiple sclerosis. °· Cancer, infection, or surgery that affects the stomach or vagus nerve. The vagus nerve runs from your chest, through your neck, to the lower part of your brain. °· A connective tissue disorder, such as scleroderma. °· Certain medicines. °What increases the risk? °You are more likely to develop this condition if you: °· Have certain disorders or diseases, including: °? An endocrine disorder. °? An eating disorder. °? Amyloidosis. °? Scleroderma. °? Parkinson's disease. °? Multiple sclerosis. °? Cancer or infection of the stomach or the vagus nerve. °· Have had surgery on the stomach or vagus nerve. °· Take certain medicines. °· Are female. °What are the signs or symptoms? °Symptoms of this condition include: °· Feeling full after eating very little. °· Nausea. °· Vomiting. °· Heartburn. °· Abdominal bloating. °· Inconsistent blood sugar (glucose) levels on blood tests. °· Lack of appetite. °· Weight loss. °· Acid from the stomach coming up into the esophagus (gastroesophageal reflux). °· Sudden tightening (spasm) of the stomach, which can be painful. °Symptoms may come and go. Some people may not notice any symptoms. °How is this diagnosed? °This condition is diagnosed with tests, such as: °· Tests that check how long it takes food to move through the stomach and  intestines. These tests include: °? Upper gastrointestinal (GI) series. For this test, you drink a liquid that shows up well on X-rays, and then X-rays will be taken of your intestines. °? Gastric emptying scintigraphy. For this test, you eat food that contains a small amount of radioactive material, and then scans are taken. °? Wireless capsule GI monitoring system. For this test, you swallow a pill (capsule) that records information about how foods and fluid move through your stomach. °· Gastric manometry. For this test, a tube is passed down your throat and into your stomach to measure electrical and muscular activity. °· Endoscopy. For this test, a long, thin tube is passed down your throat and into your stomach to check for problems in your stomach lining. °· Ultrasound. This test uses sound waves to create images of inside the body. This can help rule out gallbladder disease or pancreatitis as a cause of your symptoms. °How is this treated? °There is no cure for gastroparesis. Treatment may include: °· Treating the underlying cause. °· Managing your symptoms by making changes to your diet and exercise habits. °· Taking medicines to control nausea and vomiting and to stimulate stomach muscles. °· Getting food through a feeding tube in the hospital. This may be done in severe cases. °· Having surgery to insert a device into your body that helps improve stomach emptying and control nausea and vomiting (gastric neurostimulator). °Follow these instructions at home: °· Take over-the-counter and prescription medicines only as told by your health care provider. °· Follow instructions from your health care provider about eating or drinking restrictions. Your health care provider may recommend that you: °? Eat   smaller meals more often. °? Eat low-fat foods. °? Eat low-fiber forms of high-fiber foods. For example, eat cooked vegetables instead of raw vegetables. °? Have only liquid foods instead of solid foods. Liquid  foods are easier to digest. °· Drink enough fluid to keep your urine pale yellow. °· Exercise as often as told by your health care provider. °· Keep all follow-up visits as told by your health care provider. This is important. °Contact a health care provider if you: °· Notice that your symptoms do not improve with treatment. °· Have new symptoms. °Get help right away if you: °· Have severe abdominal pain that does not improve with treatment. °· Have nausea that is severe or does not go away. °· Cannot drink fluids without vomiting. °Summary °· Gastroparesis is a chronic condition in which food takes longer than normal to empty from the stomach. °· Symptoms include nausea, vomiting, heartburn, abdominal bloating, and loss of appetite. °· Eating smaller portions, and low-fat, low-fiber foods may help you manage your symptoms. °· Get help right away if you have severe abdominal pain. °This information is not intended to replace advice given to you by your health care provider. Make sure you discuss any questions you have with your health care provider. °Document Released: 07/22/2005 Document Revised: 10/20/2017 Document Reviewed: 05/27/2017 °Elsevier Patient Education © 2020 [employer] ° °

## 2019-04-23 NOTE — Progress Notes (Signed)
Patient ID: [name], female    DOB: 10-04-1970  MRN: [id number]   SUBJECTIVE:  [name] is a 48 y.o. female who presents for hospital f/u. Where:[facility] When: 03/30/2019 through 04/01/2019 Primary Dx: Discharge diagnoses- vomiting; intractable nausea and vomiting; also with type 1 diabetes, chronic anxiety, recurrent UTI; dehydration upon admission  HPI:      48 year old female with type 1 diabetes who is status post hospital discharge from 03/30/2019 through 04/01/2019 after presenting with approximately 3 weeks of intractable nausea and vomiting after being placed on an antibiotic for treatment of UTI.  Patient had normal GI emptying study during hospitalization.  Patient with hemoglobin A1c of 13.6 during hospitalization.  She reports that she did not have health insurance for the past few years and has been receiving medical care through late June at urgent care.  She reports that prior to hospitalization she had a urinary tract infection but after starting the antibiotic she developed onset of nausea and vomiting and was hospitalized due to nausea vomiting and dehydration.  She states that she was told by the doctors at the hospital that the nausea or vomiting might be related to her diabetes.  Patient states that she does have issues with chronic pain and numbness in her feet related to diabetes.  She also reports that after her mother's death a few years ago she had increased issues with anxiety and depression and developed started scratching her legs and developed wounds.  She states that over the past few days she has had some increased onset of itching as well.  She tends to stand a lot during her workday and she tends to have swelling in her lower legs.  Swelling does improve overnight.  Patient states that she was also told at the hospital that she needed to have her urine rechecked.  She reports that she will felt well for a few days after  hospitalization but has had return of nausea and vomiting with last episode of emesis yesterday but she chronically feels nauseous and occasionally has some mild mid upper abdominal discomfort/pain.  She denies any blood in the stool and no black stools.      She reports that she did not receive a prescription for short acting insulin at time of hospital discharge, only the long-acting insulin.  Patient states that she is supposed to be on 70 units of Tresiba daily but she currently only takes 20 to 25 units in the morning as she states that she tends to eat only a small breakfast and she takes the remainder of the dose after her evening meal.  She admits that her blood sugars continue to be in the 180s to 200s fasting and higher later in the day.  She does have some increased thirst and urinary frequency in addition to numbness and tingling in her feet.  She believes her blood sugars are high at today's visit as she ate a few bites of a burger prior to coming into the office and did not have any short acting insulin to take pre-meal.   Patient Active Problem List   Diagnosis Date Noted   Intractable nausea and vomiting    Vomiting 40/81/4481   Complicated grieving 85/63/1497   Ulcers of both lower legs ([place]) 02/20/2014   Health maintenance examination 09/20/2013   Diabetic ulcer of left foot ([place]) 05/12/2013   Diabetic foot ulcers ([place]) 05/01/2013   Skin rash 01/05/2013   Contraception management 12/31/2012  Chest pain 12/30/2012   Mood disorder ([place]) 12/30/2012   Unspecified vitamin D deficiency 12/03/2012   H/O thyroid nodule 12/03/2012   Type 1 diabetes mellitus with diabetic neuropathy ([place]) 06/18/2007     Current Outpatient Medications on File Prior to Visit  Medication Sig Dispense Refill   Blood Glucose Monitoring Suppl (BLOOD GLUCOSE METER KIT AND SUPPLIES) Dispense based on patient and insurance preference. Use up to four times daily as directed. (FOR ICD-9 250.00,  250.01). 1 each 0   Cyanocobalamin (VITAMELTS ENERGY VITAMIN B-12 PO) Take 1 tablet by mouth every Thursday.      diclofenac sodium (VOLTAREN) 1 % GEL Apply 2 g topically 4 (four) times daily. 100 g 0   glucagon (GLUCAGON EMERGENCY) 1 MG injection Inject 1 mg into the muscle once as needed. (Patient taking differently: Inject 1 mg into the muscle once as needed. ) 2 each prn   glucose blood (ACCU-CHEK ACTIVE STRIPS) test strip Check 3-4 times daily for uncontrolled insulin dependent type 1 diabetes 100 each 12   hydrALAZINE (APRESOLINE) 50 MG tablet Take 50 mg by mouth 3 (three) times daily.     insulin aspart (NOVOLOG FLEXPEN) 100 UNIT/ML FlexPen Inject 8 units into the skin at breakfast, 12 units at lunch and 8 units at dinner. (Patient taking differently: Inject 15-20 Units into the skin 3 (three) times daily with meals. Inject 15 units into the skin at breakfast, 15 units at lunch and 20 units per sliding scale.) 15 mL 2   Insulin Degludec (TRESIBA FLEXTOUCH) 200 UNIT/ML SOPN Inject 25 Units into the skin 2 (two) times daily.     lactulose (CHRONULAC) 10 GM/15ML solution Take 15 mLs by mouth daily as needed for constipation.     Lancets (ACCU-CHEK SOFT TOUCH) lancets Check 3-4 times daily for uncontrolled insulin dependent type 1 diabetes 100 each 12   Multiple Vitamins-Minerals (RA ONE DAILY GUMMY VITES) CHEW Chew 1 tablet by mouth daily.     pantoprazole (PROTONIX) 40 MG tablet Take 1 tablet (40 mg total) by mouth daily. 30 tablet 0   promethazine (PHENERGAN) 12.5 MG tablet Take 1 tablet (12.5 mg total) by mouth every 8 (eight) hours as needed for up to 3 days for nausea or vomiting. 9 tablet 0   No current facility-administered medications on file prior to visit.     Allergies  Allergen Reactions   Amoxicillin Itching   Gabapentin Other (See Comments)    Dizziness   Lantus [Insulin Glargine] Itching and Rash    Social History   Tobacco Use   Smoking status: Never  Smoker   Smokeless tobacco: Never Used  Substance Use Topics   Alcohol use: Yes    Comment: Occasional wine   Drug use: No     Family History  Problem Relation Age of Onset   Diabetes Mother        type 2   Hypertension Mother    Thyroid disease Mother    Bipolar disorder Mother    Heart disease Mother    Calcium disorder Mother    Cancer Maternal Grandmother        Breast, stomach   Cancer Paternal Grandmother        stomach, lung (smoker)   Diabetes Paternal Grandmother    Diabetes Paternal Grandfather    CAD Sister        stent   Stroke Maternal Aunt    Cancer Maternal Uncle        prostate  CAD Maternal Aunt        stents   Cancer Maternal Aunt 64       ovarian    Past Surgical History:  Procedure Laterality Date   treadmill stress test  01/2013   WNL, low risk study    ROS: Review of Systems  Constitutional: Positive for fatigue. Negative for chills and fever.  Respiratory: Negative for cough and shortness of breath.   Cardiovascular: Positive for leg swelling. Negative for chest pain and palpitations.  Gastrointestinal: Positive for abdominal pain, nausea and vomiting. Negative for constipation and diarrhea.  Endocrine: Positive for polydipsia and polyuria. Negative for cold intolerance, heat intolerance and polyphagia.  Genitourinary: Negative for dysuria and frequency.  Skin: Positive for rash. Negative for wound.  Neurological: Positive for numbness. Negative for dizziness and headaches.  Hematological: Negative for adenopathy. Does not bruise/bleed easily.  Psychiatric/Behavioral: Positive for sleep disturbance. Negative for self-injury and suicidal ideas. The patient is nervous/anxious.      PHYSICAL EXAM: BP 137/86 (BP Location: Left Arm, Patient Position: Sitting, Cuff Size: Large)    Pulse (!) 104    Temp 98.3 F (36.8 C) (Oral)    Ht 6' (1.829 m)    Wt 167 lb 3.2 oz (75.8 kg)    LMP 04/09/2019    BMI 22.68 kg/m    Physical  Exam Vitals signs and nursing note reviewed.  Constitutional:      Appearance: Normal appearance.  Neck:     Musculoskeletal: Normal range of motion and neck supple. No neck rigidity or muscular tenderness.     Vascular: No carotid bruit.  Cardiovascular:     Rate and Rhythm: Regular rhythm. Tachycardia present.     Pulses:          Dorsalis pedis pulses are 1+ on the right side and 2+ on the left side.       Posterior tibial pulses are 1+ on the right side and 2+ on the left side.  Pulmonary:     Effort: Pulmonary effort is normal.     Breath sounds: Normal breath sounds.  Abdominal:     Palpations: Abdomen is soft.     Tenderness: There is abdominal tenderness (mild epigastric tenderness to palp). There is no right CVA tenderness, left CVA tenderness, guarding or rebound.  Musculoskeletal: Normal range of motion.        General: No tenderness.     Right lower leg: Edema present.     Left lower leg: Edema present.     Right foot: Deformity (early hammertoes) present.     Left foot: Deformity (developing rockerbottom deformity) present.     Comments: Mild non-pitting LE edema and chronic skin changes/hypertrophic skin and scarring; mild left CVA discomfort  Feet:     Right foot:     Protective Sensation: 10 sites tested. 1 site sensed.    Skin integrity: Dry skin present.     Toenail Condition: Fungal disease present.    Left foot:     Protective Sensation: 10 sites tested. 0 sites sensed.     Skin integrity: Dry skin present.     Toenail Condition: Fungal disease present.    Comments: Patient with scarring on the bilateral LE and feet and has had removal of great toe nails with some regrowth at nailbed Lymphadenopathy:     Cervical: No cervical adenopathy.  Skin:    General: Skin is warm and dry.     Findings: Lesion present.  Comments: Hypertrophic scarred skin on the LE's/ankle/feet  Neurological:     General: No focal deficit present.     Mental Status: She is alert  and oriented to person, place, and time.     Comments: No focal deficits other than abnormal monofilament exam of the feet  Psychiatric:        Mood and Affect: Mood normal.        Behavior: Behavior normal.     ASSESSMENT AND PLAN: - Need for immunization against influenza Patient was offered and agreed to have influenza immunization at today's visit.  She was also given educational handout regarding influenza immunization - Flu Vaccine QUAD 6+ mos PF IM (Fluarix Quad PF)  1. Type 1 diabetes mellitus with diabetic neuropathy ([place]); Hospital discharge follow-up; encounter to establish care Glucose elevated at 468 and patient reports that she ate a few bites of a hamburger prior to coming into the office for visit.  She also does not have any short acting insulin to take pre-meals.  Patient was given NovoLog 12 units x 1 in the office to help lower blood sugars.  She also had a urinalysis to look for ketones as well as to check for UTI which may be contributing to elevated blood sugars.  Patient given new prescriptions for her Tyler Aas as well as prescription for short acting insulin for pre-meals.  Patient is being referred to endocrinology for further evaluation and treatment of her uncontrolled type 1 diabetes.  Patient will also be referred to podiatry for ongoing diabetic foot care.  Additionally, patient will be referred to GI for evaluation of nausea and vomiting which may be related to diabetic gastroparesis but urine also check to look for presence of urinary tract infection which may be contributing to nausea and vomiting.  Patient is encouraged to schedule yearly diabetic eye exam.  Patient given written prescriptions including diagnosis code and most recent hemoglobin A1c in order to obtain testing supplies so that she can monitor her blood sugars. - POCT URINALYSIS DIP (CLINITEK) - Glucose (CBG) - Comprehensive metabolic panel - Ambulatory referral to Endocrinology - Ambulatory referral  to Gastroenterology - Ambulatory referral to Podiatry - insulin aspart (novoLOG) injection 12 Units - insulin aspart (NOVOLOG FLEXPEN) 100 UNIT/ML FlexPen; Inject 8 units into the skin at breakfast, 12 units at lunch and 8 units at dinner.  Dispense: 15 mL; Refill: 3 - Insulin Degludec (TRESIBA FLEXTOUCH) 200 UNIT/ML SOPN; Inject 36 Units into the skin 2 (two) times daily.  Dispense: 10.8 mL; Refill: 3 - Blood Glucose Monitoring Suppl (ONETOUCH VERIO) w/Device KIT; Use as directed to check blood sugar  Dispense: 1 kit; Refill: 0 - glucose blood (ONETOUCH VERIO) test strip; Use as instructed to check blood sugar 4 times daily  Dispense: 100 each; Refill: 12 - OneTouch Delica Lancets 77A MISC; Use when checking blood sugars 4 times daily  Dispense: 100 each; Refill: prn  2. Recurrent UTI (urinary tract infection) Patient with recurrent urinary tract infection and patient urine culture from recent hospital discharge showed growth of more than 1 organism and organisms were not identified and patient was not discharged on antibiotic therapy.  She will have repeat urinalysis at today's visit and urine will also be sent for culture.  She also had hematuria on urinalysis and if this is still present without signs of infection, she will be referred to urology for further evaluation and treatment.  If she does have infection, infection will be treated and repeat urinalysis obtained after  antibiotic therapy and if continued hematuria with resolution of infection, urology referral will be warranted. - POCT URINALYSIS DIP (CLINITEK) - Urine Culture  3. Non-intractable vomiting with nausea, unspecified vomiting type Patient with normal gastric emptying study in hospital but has continued N/V. Will refer to gastroenterology for further evaluation. Check UA to see if she has UTI that may be contributing to N/V but also provided RX for Zofran to help with nausea and pantoprazole refilled.  - Comprehensive metabolic  panel - Ambulatory referral to Gastroenterology - pantoprazole (PROTONIX) 40 MG tablet; Take 1 tablet (40 mg total) by mouth daily.  Dispense: 30 tablet; Refill: 3 - ondansetron (ZOFRAN) 4 MG tablet; Take 1 tablet (4 mg total) by mouth every 8 (eight) hours as needed for nausea or vomiting.  Dispense: 20 tablet; Refill: 3  4. Tachycardia Unsure if related to dehydration, thyroid disorder or infection but will check electrolytes/Cr, TSH as well as CBC and UA. Encouraged patient to remain hydrated and monitor blood sugars.  - TSH - CBC with Differential  5. Hyperglycemia Patient reports that she ate a few bites of a burger prior to coming into the office and has been out of Novolog and takes a reduced dose of Tresiba in the mornings and larger dose at night since she tends to not eat much for breakfast. She was given 12 units of Novolog x 1 and UA done to look for ketones/presence of infection.  - insulin aspart (novoLOG) injection 12 Units  6. Acute cystitis with hematuria Patient with positive nitrites on UA and urine sent for culture. As UTI may be contributing to her elevated glucose, RX sent into pharmacy for Levaquin 500 mg once daily for 7 days while urine culture is pending.  - levofloxacin (LEVAQUIN) 500 MG tablet; Take 1 tablet (500 mg total) by mouth daily.  Dispense: 7 tablet; Refill: 0  7.  Screening for breast cancer Order placed for screening mammogram  An After Visit Summary was printed and given to the patient.  Return in about 4 weeks (around 05/21/2019) for DM-sooner if needed.  Antony Blackbird, MD, [facility]

## 2019-04-24 LAB — COMPREHENSIVE METABOLIC PANEL WITH GFR
ALT: 13 IU/L (ref 0–32)
AST: 9 IU/L (ref 0–40)
Albumin/Globulin Ratio: 1.2 (ref 1.2–2.2)
Albumin: 3.3 g/dL — ABNORMAL LOW (ref 3.8–4.8)
Alkaline Phosphatase: 97 IU/L (ref 39–117)
BUN/Creatinine Ratio: 18 (ref 9–23)
BUN: 13 mg/dL (ref 6–24)
Bilirubin Total: 0.8 mg/dL (ref 0.0–1.2)
CO2: 23 mmol/L (ref 20–29)
Calcium: 8.9 mg/dL (ref 8.7–10.2)
Chloride: 99 mmol/L (ref 96–106)
Creatinine, Ser: 0.73 mg/dL (ref 0.57–1.00)
GFR calc Af Amer: 113 mL/min/1.73
GFR calc non Af Amer: 98 mL/min/1.73
Globulin, Total: 2.7 g/dL (ref 1.5–4.5)
Glucose: 482 mg/dL — ABNORMAL HIGH (ref 65–99)
Potassium: 4.4 mmol/L (ref 3.5–5.2)
Sodium: 133 mmol/L — ABNORMAL LOW (ref 134–144)
Total Protein: 6 g/dL (ref 6.0–8.5)

## 2019-04-24 LAB — CBC WITH DIFFERENTIAL/PLATELET
Basophils Absolute: 0 x10E3/uL (ref 0.0–0.2)
Basos: 0 %
EOS (ABSOLUTE): 0 x10E3/uL (ref 0.0–0.4)
Eos: 1 %
Hematocrit: 34.7 % (ref 34.0–46.6)
Hemoglobin: 11 g/dL — ABNORMAL LOW (ref 11.1–15.9)
Immature Grans (Abs): 0 x10E3/uL (ref 0.0–0.1)
Immature Granulocytes: 0 %
Lymphocytes Absolute: 1.3 x10E3/uL (ref 0.7–3.1)
Lymphs: 29 %
MCH: 29.1 pg (ref 26.6–33.0)
MCHC: 31.7 g/dL (ref 31.5–35.7)
MCV: 92 fL (ref 79–97)
Monocytes Absolute: 0.3 x10E3/uL (ref 0.1–0.9)
Monocytes: 6 %
Neutrophils Absolute: 2.9 x10E3/uL (ref 1.4–7.0)
Neutrophils: 64 %
Platelets: 263 x10E3/uL (ref 150–450)
RBC: 3.78 x10E6/uL (ref 3.77–5.28)
RDW: 12.7 % (ref 11.7–15.4)
WBC: 4.5 x10E3/uL (ref 3.4–10.8)

## 2019-04-24 LAB — TSH: TSH: 0.995 u[IU]/mL (ref 0.450–4.500)

## 2019-04-24 MED ORDER — PEN NEEDLES 31G X 5 MM MISC: 1.0000 | Freq: Every day | 99 refills | Status: DC

## 2019-04-25 LAB — URINE CULTURE

## 2019-04-27 ENCOUNTER — Telehealth: Payer: Self-pay | Admitting: *Deleted

## 2019-04-27 NOTE — Telephone Encounter (Signed)
Patient called wanting to get her results. Please follow up

## 2019-05-07 NOTE — Telephone Encounter (Signed)
Already informed patient with results

## 2019-05-10 ENCOUNTER — Ambulatory Visit: Payer: Self-pay | Admitting: Podiatry

## 2019-05-10 ENCOUNTER — Ambulatory Visit: Payer: BC Managed Care – PPO | Admitting: Internal Medicine

## 2019-05-10 DIAGNOSIS — Z0289 Encounter for other administrative examinations: Secondary | ICD-10-CM

## 2019-05-27 ENCOUNTER — Other Ambulatory Visit: Payer: Self-pay

## 2019-05-27 ENCOUNTER — Ambulatory Visit: Payer: BC Managed Care – PPO | Attending: Family Medicine | Admitting: Family Medicine

## 2019-06-03 ENCOUNTER — Ambulatory Visit (INDEPENDENT_AMBULATORY_CARE_PROVIDER_SITE_OTHER): Payer: BC Managed Care – PPO | Admitting: Internal Medicine

## 2019-06-08 ENCOUNTER — Ambulatory Visit (INDEPENDENT_AMBULATORY_CARE_PROVIDER_SITE_OTHER): Payer: BC Managed Care – PPO | Admitting: Nurse Practitioner

## 2019-08-20 ENCOUNTER — Other Ambulatory Visit: Payer: Self-pay | Admitting: Family Medicine

## 2019-08-20 DIAGNOSIS — R112 Nausea with vomiting, unspecified: Secondary | ICD-10-CM

## 2019-10-11 ENCOUNTER — Other Ambulatory Visit: Payer: Self-pay

## 2019-10-11 ENCOUNTER — Observation Stay (HOSPITAL_COMMUNITY): Payer: BC Managed Care – PPO

## 2019-10-11 ENCOUNTER — Emergency Department (HOSPITAL_COMMUNITY): Payer: BC Managed Care – PPO

## 2019-10-11 ENCOUNTER — Inpatient Hospital Stay (HOSPITAL_COMMUNITY)
Admission: EM | Admit: 2019-10-11 | Discharge: 2019-10-13 | DRG: 638 | Disposition: A | Discharge status: Home / Self Care | Payer: BC Managed Care – PPO | Attending: Student | Admitting: Student

## 2019-10-11 ENCOUNTER — Encounter (HOSPITAL_COMMUNITY): Payer: Self-pay | Admitting: Family Medicine

## 2019-10-11 DIAGNOSIS — R739 Hyperglycemia, unspecified: Secondary | ICD-10-CM | POA: Diagnosis not present

## 2019-10-11 DIAGNOSIS — R55 Syncope and collapse: Secondary | ICD-10-CM

## 2019-10-11 DIAGNOSIS — Z9114 Patient's other noncompliance with medication regimen: Secondary | ICD-10-CM

## 2019-10-11 DIAGNOSIS — E1165 Type 2 diabetes mellitus with hyperglycemia: Secondary | ICD-10-CM | POA: Diagnosis not present

## 2019-10-11 DIAGNOSIS — N938 Other specified abnormal uterine and vaginal bleeding: Secondary | ICD-10-CM | POA: Diagnosis not present

## 2019-10-11 DIAGNOSIS — F329 Major depressive disorder, single episode, unspecified: Secondary | ICD-10-CM | POA: Diagnosis present

## 2019-10-11 DIAGNOSIS — N179 Acute kidney failure, unspecified: Secondary | ICD-10-CM | POA: Diagnosis present

## 2019-10-11 DIAGNOSIS — Z794 Long term (current) use of insulin: Secondary | ICD-10-CM | POA: Diagnosis not present

## 2019-10-11 DIAGNOSIS — R634 Abnormal weight loss: Secondary | ICD-10-CM

## 2019-10-11 DIAGNOSIS — D649 Anemia, unspecified: Secondary | ICD-10-CM | POA: Diagnosis not present

## 2019-10-11 DIAGNOSIS — E1065 Type 1 diabetes mellitus with hyperglycemia: Principal | ICD-10-CM | POA: Diagnosis present

## 2019-10-11 DIAGNOSIS — E1040 Type 1 diabetes mellitus with diabetic neuropathy, unspecified: Secondary | ICD-10-CM | POA: Diagnosis present

## 2019-10-11 DIAGNOSIS — N939 Abnormal uterine and vaginal bleeding, unspecified: Secondary | ICD-10-CM | POA: Diagnosis not present

## 2019-10-11 DIAGNOSIS — K922 Gastrointestinal hemorrhage, unspecified: Secondary | ICD-10-CM | POA: Diagnosis not present

## 2019-10-11 DIAGNOSIS — Z833 Family history of diabetes mellitus: Secondary | ICD-10-CM | POA: Diagnosis not present

## 2019-10-11 DIAGNOSIS — K921 Melena: Secondary | ICD-10-CM | POA: Diagnosis present

## 2019-10-11 DIAGNOSIS — E876 Hypokalemia: Secondary | ICD-10-CM | POA: Diagnosis not present

## 2019-10-11 DIAGNOSIS — N289 Disorder of kidney and ureter, unspecified: Secondary | ICD-10-CM | POA: Diagnosis not present

## 2019-10-11 DIAGNOSIS — R197 Diarrhea, unspecified: Secondary | ICD-10-CM | POA: Diagnosis not present

## 2019-10-11 DIAGNOSIS — N189 Chronic kidney disease, unspecified: Secondary | ICD-10-CM | POA: Diagnosis not present

## 2019-10-11 DIAGNOSIS — F419 Anxiety disorder, unspecified: Secondary | ICD-10-CM | POA: Diagnosis present

## 2019-10-11 DIAGNOSIS — D638 Anemia in other chronic diseases classified elsewhere: Secondary | ICD-10-CM | POA: Diagnosis present

## 2019-10-11 DIAGNOSIS — Z20822 Contact with and (suspected) exposure to covid-19: Secondary | ICD-10-CM | POA: Diagnosis not present

## 2019-10-11 DIAGNOSIS — R110 Nausea: Secondary | ICD-10-CM | POA: Diagnosis not present

## 2019-10-11 DIAGNOSIS — K625 Hemorrhage of anus and rectum: Secondary | ICD-10-CM

## 2019-10-11 DIAGNOSIS — D251 Intramural leiomyoma of uterus: Secondary | ICD-10-CM | POA: Diagnosis not present

## 2019-10-11 LAB — CBG MONITORING, ED
Glucose-Capillary: >600 mg/dL (ref 70–99)
Glucose-Capillary: 477 mg/dL — ABNORMAL HIGH (ref 70–99)
Glucose-Capillary: 517 mg/dL (ref 70–99)
Glucose-Capillary: 446 mg/dL — ABNORMAL HIGH (ref 70–99)
Glucose-Capillary: 354 mg/dL — ABNORMAL HIGH (ref 70–99)
Glucose-Capillary: 290 mg/dL — ABNORMAL HIGH (ref 70–99)

## 2019-10-11 LAB — COMPREHENSIVE METABOLIC PANEL
ALT: 15 U/L (ref 0–44)
AST: 11 U/L — ABNORMAL LOW (ref 15–41)
Albumin: 2.8 g/dL — ABNORMAL LOW (ref 3.5–5.0)
Alkaline Phosphatase: 95 U/L (ref 38–126)
Anion gap: 12 (ref 5–15)
BUN: 14 mg/dL (ref 6–20)
CO2: 22 mmol/L (ref 22–32)
Calcium: 9.5 mg/dL (ref 8.9–10.3)
Chloride: 93 mmol/L — ABNORMAL LOW (ref 98–111)
Creatinine, Ser: 1.13 mg/dL — ABNORMAL HIGH (ref 0.44–1.00)
GFR calc Af Amer: >60 mL/min (ref >60)
GFR calc non Af Amer: 57 mL/min — ABNORMAL LOW (ref >60)
Glucose, Bld: 908 mg/dL (ref 70–99)
Potassium: 4.8 mmol/L (ref 3.5–5.1)
Sodium: 127 mmol/L — ABNORMAL LOW (ref 135–145)
Total Bilirubin: 0.9 mg/dL (ref 0.3–1.2)
Total Protein: 7.4 g/dL (ref 6.5–8.1)

## 2019-10-11 LAB — WET PREP, GENITAL
Clue Cells Wet Prep HPF POC: NONE SEEN
Sperm: NONE SEEN
Trich, Wet Prep: NONE SEEN
Yeast Wet Prep HPF POC: NONE SEEN

## 2019-10-11 LAB — CBC
HCT: 36.8 % (ref 36.0–46.0)
Hemoglobin: 11.9 g/dL — ABNORMAL LOW (ref 12.0–15.0)
MCH: 29.4 pg (ref 26.0–34.0)
MCHC: 32.3 g/dL (ref 30.0–36.0)
MCV: 90.9 fL (ref 80.0–100.0)
Platelets: 282 10*3/uL (ref 150–400)
RBC: 4.05 MIL/uL (ref 3.87–5.11)
RDW: 13.3 % (ref 11.5–15.5)
WBC: 6.4 10*3/uL (ref 4.0–10.5)
nRBC: 0 % (ref 0.0–0.2)

## 2019-10-11 LAB — GLUCOSE, CAPILLARY
Glucose-Capillary: 221 mg/dL — ABNORMAL HIGH (ref 70–99)
Glucose-Capillary: 195 mg/dL — ABNORMAL HIGH (ref 70–99)

## 2019-10-11 LAB — URINALYSIS, ROUTINE W REFLEX MICROSCOPIC
Bilirubin Urine: NEGATIVE
Glucose, UA: >=500 mg/dL — AB
Ketones, ur: 5 mg/dL — AB
Leukocytes,Ua: NEGATIVE
Nitrite: NEGATIVE
Protein, ur: 100 mg/dL — AB
Specific Gravity, Urine: 1.023 (ref 1.005–1.030)
pH: 5 (ref 5.0–8.0)

## 2019-10-11 LAB — TYPE AND SCREEN
ABO/RH(D): A POS
Antibody Screen: NEGATIVE

## 2019-10-11 LAB — I-STAT BETA HCG BLOOD, ED (MC, WL, AP ONLY): I-stat hCG, quantitative: <5 m[IU]/mL (ref <5)

## 2019-10-11 LAB — OSMOLALITY: Osmolality: 324 mOsm/kg (ref 275–295)

## 2019-10-11 LAB — POC OCCULT BLOOD, ED: Fecal Occult Bld: POSITIVE — AB

## 2019-10-11 MED ORDER — SODIUM CHLORIDE 0.9 % IV BOLUS
1000.0000 mL | Freq: Once | INTRAVENOUS | Status: AC
  Administered 2019-10-11: 1000 mL via INTRAVENOUS

## 2019-10-11 MED ORDER — SODIUM CHLORIDE 0.9 % IV SOLN: INTRAVENOUS | Status: DC

## 2019-10-11 MED ORDER — DEXTROSE 50 % IV SOLN: 0.0000 mL | INTRAVENOUS | Status: DC | PRN

## 2019-10-11 MED ORDER — INSULIN REGULAR(HUMAN) IN NACL 100-0.9 UT/100ML-% IV SOLN
INTRAVENOUS | Status: DC
  Filled 2019-10-11: qty 100

## 2019-10-11 MED ORDER — INSULIN REGULAR(HUMAN) IN NACL 100-0.9 UT/100ML-% IV SOLN
INTRAVENOUS | Status: DC
  Administered 2019-10-11: 7 [IU]/h via INTRAVENOUS
  Filled 2019-10-11: qty 100

## 2019-10-11 MED ORDER — DEXTROSE-NACL 5-0.45 % IV SOLN: INTRAVENOUS | Status: DC

## 2019-10-11 MED ORDER — IOHEXOL 300 MG/ML  SOLN
100.0000 mL | Freq: Once | INTRAMUSCULAR | Status: AC | PRN
  Administered 2019-10-11: 100 mL via INTRAVENOUS

## 2019-10-11 MED ORDER — HYDROXYZINE HCL 25 MG PO TABS
50.0000 mg | ORAL_TABLET | Freq: Four times a day (QID) | ORAL | Status: DC | PRN
  Administered 2019-10-12 – 2019-10-13 (×2): 50 mg via ORAL
  Filled 2019-10-11: qty 2
  Filled 2019-10-11: qty 1
  Filled 2019-10-11: qty 2

## 2019-10-11 MED ORDER — DULOXETINE HCL 60 MG PO CPEP
60.0000 mg | ORAL_CAPSULE | Freq: Every day | ORAL | Status: DC
  Administered 2019-10-12 – 2019-10-13 (×2): 60 mg via ORAL
  Filled 2019-10-11 (×2): qty 1

## 2019-10-11 NOTE — ED Provider Notes (Signed)
[facility] EMERGENCY DEPARTMENT Provider Note   CSN: [id number] Arrival date & time: 10/11/19  1509     History Chief Complaint  Patient presents with  . Dizziness  . GI Bleeding    [name] is a 49 y.o. female.  [name] is a 49 y.o. female with a history of type 1 diabetes, arthritis, migraines, and anxiety, who presents to the emergency department for evaluation of 2 days of lightheadedness.  Patient states she has been feeling like she is going to pass out and it was especially bad at work today causing her to leave work and come to the hospital.  She states that for the past 2 days she has noticed bright red blood mixed in with her stool and in the toilet bowl.  She denies any previous history of GI bleeding.  She does report that for the past 2 months for 4 to 5 days every week she has vaginal bleeding that is like her previous menstrual cycle, but before this was not having regular bleeding.  She states that she feels weak and is more pale than usual.  She also states that over the past few months she feels like she has lost a significant amount of weight, from 5 months ago patient's weight has dropped about 4 kg.  Patient states she is diabetic but does not have a primary care doctor, has been running out of her insulin and so has been rationing it and not using it regularly.  She is also out of test strips and has not been able to check her blood sugar.  She states she used the last few units of her insulin last night at dinnertime.  She reports she has had some frequent urination but denies dysuria.  States she was placed on antibiotics for dental infection and since then has been having some vaginal discharge, denies current vaginal bleeding today.  Patient denies abdominal pain, nausea or vomiting.  States she has been taking medicine to help with constipation.        Past Medical History:  Diagnosis Date  . Arthritis   . History  of chicken pox   . Migraines   . Mood disorder (HCC)    anxiety  . Prurigo nodularis    with diabetic dermopathy  . Type 1 diabetes, uncontrolled, with neuropathy [facility])    [place]    Patient Active Problem List   Diagnosis Date Noted  . Intractable nausea and vomiting   . Vomiting 03/30/2019  . Complicated grieving 53/00/5110  . Ulcers of both lower legs ([place]) 02/20/2014  . Health maintenance examination 09/20/2013  . Diabetic ulcer of left foot ([place]) 05/12/2013  . Diabetic foot ulcers ([place]) 05/01/2013  . Skin rash 01/05/2013  . Contraception management 12/31/2012  . Chest pain 12/30/2012  . Mood disorder ([place]) 12/30/2012  . Unspecified vitamin D deficiency 12/03/2012  . H/O thyroid nodule 12/03/2012  . Type 1 diabetes mellitus with diabetic neuropathy ([place]) 06/18/2007    Past Surgical History:  Procedure Laterality Date  . treadmill stress test  01/2013   WNL, low risk study     OB History    Gravida  6   Para  2   Term      Preterm      AB  4   Living  2     SAB  4   TAB      Ectopic      Multiple  Live Births              Family History  Problem Relation Age of Onset  . Diabetes Mother        type 2  . Hypertension Mother   . Thyroid disease Mother   . Bipolar disorder Mother   . Heart disease Mother   . Calcium disorder Mother   . Cancer Maternal Grandmother        Breast, stomach  . Cancer Paternal Grandmother        stomach, lung (smoker)  . Diabetes Paternal Grandmother   . Diabetes Paternal Grandfather   . CAD Sister        stent  . Stroke Maternal Aunt   . Cancer Maternal Uncle        prostate  . CAD Maternal Aunt        stents  . Cancer Maternal Aunt 64       ovarian    Social History   Tobacco Use  . Smoking status: Never Smoker  . Smokeless tobacco: Never Used  Substance Use Topics  . Alcohol use: Yes    Comment: Occasional wine  . Drug use: No    Home Medications Prior to Admission medications     Medication Sig Start Date End Date Taking? Authorizing Provider  Blood Glucose Monitoring Suppl (BLOOD GLUCOSE METER KIT AND SUPPLIES) Dispense based on patient and insurance preference. Use up to four times daily as directed. (FOR ICD-9 250.00, 250.01). Patient not taking: Reported on 04/23/2019 06/15/14   Ria Bush, MD  Blood Glucose Monitoring Suppl [facility] VERIO) w/Device KIT Use as directed to check blood sugar 04/23/19   Fulp, Cammie, MD  Cyanocobalamin (VITAMELTS ENERGY VITAMIN B-12 PO) Take 1 tablet by mouth every Thursday.     [provider]  diclofenac sodium (VOLTAREN) 1 % GEL Apply 2 g topically 4 (four) times daily. Patient not taking: Reported on 04/23/2019 11/25/17   Caccavale, Sophia, PA-C  DULoxetine (CYMBALTA) 60 MG capsule Take 60 mg by mouth daily.    [provider]  glucagon (GLUCAGON EMERGENCY) 1 MG injection Inject 1 mg into the muscle once as needed. Patient not taking: Reported on 04/23/2019 12/02/12   Philemon Kingdom, MD  glucose blood (ACCU-CHEK ACTIVE STRIPS) test strip Check 3-4 times daily for uncontrolled insulin dependent type 1 diabetes Patient not taking: Reported on 04/23/2019 06/15/14   Ria Bush, MD  glucose blood [facility] VERIO) test strip Use as instructed to check blood sugar 4 times daily 04/23/19   Fulp, Cammie, MD  hydrALAZINE (APRESOLINE) 50 MG tablet Take 50 mg by mouth 3 (three) times daily.    [provider]  insulin aspart (NOVOLOG FLEXPEN) 100 UNIT/ML FlexPen Inject 8 units into the skin at breakfast, 12 units at lunch and 8 units at dinner. 04/23/19   Fulp, Cammie, MD  Insulin Pen Needle (PEN NEEDLES) 31G X 5 MM MISC 1 each by Does not apply route 6 (six) times daily. 04/24/19   Fulp, Cammie, MD  lactulose (CHRONULAC) 10 GM/15ML solution Take 15 mLs by mouth daily as needed for constipation.    [provider]  Lancets (ACCU-CHEK SOFT TOUCH) lancets Check 3-4 times daily for uncontrolled insulin  dependent type 1 diabetes Patient not taking: Reported on 04/23/2019 06/15/14   Ria Bush, MD  levofloxacin (LEVAQUIN) 500 MG tablet Take 1 tablet (500 mg total) by mouth daily. 04/23/19   Fulp, Cammie, MD  Multiple Vitamins-Minerals (RA ONE DAILY GUMMY VITES) CHEW  Chew 1 tablet by mouth daily.    [provider]  ondansetron (ZOFRAN) 4 MG tablet Take 1 tablet (4 mg total) by mouth every 8 (eight) hours as needed for nausea or vomiting. 04/23/19   Fulp, Ander Gaster, MD  OneTouch Delica Lancets 28A MISC Use when checking blood sugars 4 times daily 04/23/19   Fulp, Cammie, MD  pantoprazole (PROTONIX) 40 MG tablet Take 1 tablet (40 mg total) by mouth daily. 04/23/19 05/23/19  Fulp, Ander Gaster, MD  promethazine (PHENERGAN) 12.5 MG tablet Take 1 tablet (12.5 mg total) by mouth every 8 (eight) hours as needed for up to 3 days for nausea or vomiting. 04/01/19 04/23/19  Elodia Florence., MD  Vitamin D, Ergocalciferol, (DRISDOL) 1.25 MG (50000 UT) CAPS capsule Take 50,000 Units by mouth every 7 (seven) days.    [provider]    Allergies    Amoxicillin, Gabapentin, and Lantus [insulin glargine]  Review of Systems   Review of Systems  Constitutional: Negative for chills and fever.  HENT: Negative.   Respiratory: Negative for cough.   Cardiovascular: Negative for chest pain.  Gastrointestinal: Positive for blood in stool and constipation. Negative for abdominal pain, nausea and vomiting.  Genitourinary: Positive for vaginal bleeding. Negative for dysuria and frequency.  Musculoskeletal: Negative for arthralgias and myalgias.  Skin: Negative for color change and rash.  Neurological: Positive for light-headedness. Negative for syncope.  All other systems reviewed and are negative.   Physical Exam Updated Vital Signs BP (!) 152/95 (BP Location: Left Arm)   Pulse (!) 117   Temp 97.9 F (36.6 C) (Oral)   Resp 16   SpO2 100%   Physical Exam Vitals and nursing note reviewed.    Constitutional:      General: She is not in acute distress.    Appearance: Normal appearance. She is well-developed and normal weight. She is not ill-appearing or diaphoretic.  HENT:     Head: Normocephalic and atraumatic.     Mouth/Throat:     Mouth: Mucous membranes are moist.     Pharynx: Oropharynx is clear.  Eyes:     General:        Right eye: No discharge.        Left eye: No discharge.     Pupils: Pupils are equal, round, and reactive to light.  Cardiovascular:     Rate and Rhythm: Regular rhythm. Tachycardia present.     Heart sounds: Normal heart sounds. No murmur. No friction rub. No gallop.   Pulmonary:     Effort: Pulmonary effort is normal. No respiratory distress.     Breath sounds: Normal breath sounds. No wheezing or rales.     Comments: Respirations equal and unlabored, patient able to speak in full sentences, lungs clear to auscultation bilaterally Abdominal:     General: Bowel sounds are normal. There is no distension.     Palpations: Abdomen is soft. There is no mass.     Tenderness: There is no abdominal tenderness. There is no guarding.     Comments: Abdomen soft, nondistended, nontender to palpation in all quadrants without guarding or peritoneal signs  Genitourinary:    Comments: Rectal exam light pink tent noted suggestive of blood but no gross blood noted. Pelvic exam with some erythema and vaginal irritation, moderate amount of white discharge present, no cervical lesions. On bimanual exam there is no cervical motion tenderness, no focal adnexal or uterine tenderness or palpable masses. Musculoskeletal:  General: No deformity.     Cervical back: Neck supple.  Skin:    General: Skin is warm and dry.     Capillary Refill: Capillary refill takes less than 2 seconds.     Coloration: Skin is pale.  Neurological:     Mental Status: She is alert.     Coordination: Coordination normal.     Comments: Speech is clear, able to follow commands Moves  extremities without ataxia, coordination intact  Psychiatric:        Mood and Affect: Mood is anxious.        Behavior: Behavior normal.     ED Results / Procedures / Treatments   Labs (all labs ordered are listed, but only abnormal results are displayed) Labs Reviewed  WET PREP, GENITAL - Abnormal; Notable for the following components:      Result Value   WBC, Wet Prep HPF POC MANY (*)    All other components within normal limits  COMPREHENSIVE METABOLIC PANEL - Abnormal; Notable for the following components:   Sodium 127 (*)    Chloride 93 (*)    Glucose, Bld 908 (*)    Creatinine, Ser 1.13 (*)    Albumin 2.8 (*)    AST 11 (*)    GFR calc non Af Amer 57 (*)    All other components within normal limits  CBC - Abnormal; Notable for the following components:   Hemoglobin 11.9 (*)    All other components within normal limits  URINALYSIS, ROUTINE W REFLEX MICROSCOPIC - Abnormal; Notable for the following components:   Color, Urine STRAW (*)    Glucose, UA >=500 (*)    Hgb urine dipstick SMALL (*)    Ketones, ur 5 (*)    Protein, ur 100 (*)    Bacteria, UA RARE (*)    All other components within normal limits  POC OCCULT BLOOD, ED - Abnormal; Notable for the following components:   Fecal Occult Bld POSITIVE (*)    All other components within normal limits  CBG MONITORING, ED - Abnormal; Notable for the following components:   Glucose-Capillary >600 (*)    All other components within normal limits  CBG MONITORING, ED - Abnormal; Notable for the following components:   Glucose-Capillary 477 (*)    All other components within normal limits  CBG MONITORING, ED - Abnormal; Notable for the following components:   Glucose-Capillary 517 (*)    All other components within normal limits  SARS CORONAVIRUS 2 (TAT 6-24 HRS)  OSMOLALITY  I-STAT BETA HCG BLOOD, ED ([facility], [facility], [facility] ONLY)  TYPE AND SCREEN  GC/CHLAMYDIA PROBE AMP ([place]) NOT AT [facility]    EKG None  Radiology CT  ABDOMEN PELVIS W CONTRAST  Result Date: 10/11/2019 CLINICAL DATA:  GI and vaginal bleeding, weight loss EXAM: CT ABDOMEN AND PELVIS WITH CONTRAST TECHNIQUE: Multidetector CT imaging of the abdomen and pelvis was performed using the standard protocol following bolus administration of intravenous contrast. CONTRAST:  140m OMNIPAQUE IOHEXOL 300 MG/ML  SOLN COMPARISON:  None. FINDINGS: Lower chest: No acute abnormality. Hepatobiliary: No focal liver abnormality is seen. Gallbladder is contracted. No biliary dilatation. Pancreas: Unremarkable. Spleen: Unremarkable. Adrenals/Urinary Tract: Kidneys and adrenals are unremarkable. Bladder is distended. Stomach/Bowel: Stomach appears within normal limits. Bowel is normal in caliber. Normal appendix. Vascular/Lymphatic: No significant vascular findings are present. No enlarged abdominal or pelvic lymph nodes. Reproductive: A 1.4 cm enhancing lesion of right uterine body may reflect a fibroid. No adnexal mass. Other:  No abdominal wall hernia or abnormality. No abdominopelvic ascites. Musculoskeletal: No acute or significant osseous findings. IMPRESSION: No acute abnormality. Small enhancing lesion of the uterus, which may reflect a fibroid. Consider pelvic ultrasound for additional evaluation. Electronically Signed   By: Macy Mis M.D.   On: 10/11/2019 18:36    Procedures .Critical Care Performed by: Jacqlyn Larsen, PA-C Authorized by: Jacqlyn Larsen, PA-C   Critical care provider statement:    Critical care time (minutes):  45   Critical care was necessary to treat or prevent imminent or life-threatening deterioration of the following conditions:  Metabolic crisis (Hyperglycemia requiring insulin drip)   Critical care was time spent personally by me on the following activities:  Discussions with consultants, evaluation of patient's response to treatment, examination of patient, ordering and performing treatments and interventions, ordering and review of  laboratory studies, ordering and review of radiographic studies, pulse oximetry, re-evaluation of patient's condition, obtaining history from patient or surrogate and review of old charts   (including critical care time)  Medications Ordered in ED Medications  insulin regular, human (MYXREDLIN) 100 units/ 100 mL infusion (6.5 Units/hr Intravenous Rate/Dose Change 10/11/19 1856)  0.9 %  sodium chloride infusion ( Intravenous New Bag/Given 10/11/19 1857)  dextrose 5 %-0.45 % sodium chloride infusion (has no administration in time range)  dextrose 50 % solution 0-50 mL (has no administration in time range)  sodium chloride 0.9 % bolus 1,000 mL (1,000 mLs Intravenous New Bag/Given 10/11/19 1730)  iohexol (OMNIPAQUE) 300 MG/ML solution 100 mL (100 mLs Intravenous Contrast Given 10/11/19 1759)    ED Course  I have reviewed the triage vital signs and the nursing notes.  Pertinent labs & imaging results that were available during my care of the patient were reviewed by me and considered in my medical decision making (see chart for details).    MDM Rules/Calculators/A&P                      49 year old female presents with dizziness and near syncopal symptoms for the past 2 days associated with 2 days of bright red blood in the stool.  She has been near syncopal at work causing her to leave work today.  She also states that she has had abnormal vaginal bleeding over the past 2 months that has been intermittent, no vaginal bleeding currently.  Patient also reports weight loss over the past few months, previous weight from September 2020 75.8 kg, today at 59 kg.  Patient without focal abdominal tenderness.  She is Hemoccult positive but does not have gross blood on exam, no evidence of hemorrhoids.  Vaginal exam reveals some irritation and white discharge, but no palpable masses and no active vaginal bleeding.  Lab work ordered from triage significant for glucose of 908, patient is a type I diabetic, states she  has been out of her insulin and rationing it over the past month.  Fortunately patient is not in DKA with a normal anion gap and CO2, has expected hyponatremia in the setting of elevated blood glucose, slight elevation in creatinine.  Will send osmolality and start patient on insulin drip and IV fluids.  Given weight loss, any bleeding we will also get CT to rule out intra-abdominal mass as cause for patient's symptoms.  Additional lab work does not show any leukocytosis, hemoglobin stable despite reported bleeding, no evidence of UTI.  Wet prep does show many WBCs, GC/chlamydia pending.  CT abdomen pelvis shows possible small uterine  fibroid, no other acute intra-abdominal abnormalities noted, no masses.  Blood sugar is improving but feel patient would benefit from admission for better control of her diabetes, and to get her restarted on her medications.  Case discussed with Dr. Myna Hidalgo with [facility] hospitalist who will see and admit the patient.   Final Clinical Impression(s) / ED Diagnoses Final diagnoses:  Dysfunctional uterine bleeding  Lower GI bleed  Near syncope  Hyperglycemia  Weight loss    Rx / DC Orders ED Discharge Orders    None       Janet Berlin 10/11/19 2009    Margette Fast, MD 10/12/19 1126

## 2019-10-11 NOTE — H&P (Signed)
History and Physical    [name] JTT:[id number] DOB: 1970-11-18 DOA: 10/11/2019  PCP: Patient, No Pcp Per   Patient coming from: Home    Chief Complaint: Rectal bleeding, vaginal bleeding, lightheadedness   HPI: [name] is a 49 y.o. female with medical history significant for poorly controlled insulin-dependent diabetes mellitus, depression, and anxiety, now presenting to the emergency department for evaluation of lightheadedness, rectal bleeding, and abnormal vaginal bleeding.  Patient reports that she no longer has a PCP to follow-up with and has been rationing her insulin for this reason for the past couple months.  Over that same interval, she has had vaginal bleeding several days each week.  Over the past 2 days, she has seen bright red blood from her rectum mixed with normal-appearing stool, and has developed some lightheadedness but no loss of consciousness.  She denies any fevers, chills, significant abdominal pain, nausea, or vomiting.  She has never had endoscopy performed.  She has aunts and uncles on both sides of her family with history of colon cancer but the patient is unsure at what age they were diagnosed.  ED Course: Upon arrival to the ED, patient is found to be afebrile, saturating well on room air, tachycardic in the 110s, and with stable blood pressure.  EKG features sinus tachycardia with rate 108, LVH, and T wave inversions.  Chemistry panel is notable for glucose of 908 and creatinine 1.13, up from 0.73 in September 2020.  CBC unremarkable.  Pregnancy test negative.  Urinalysis with glucosuria, ketonuria, and proteinuria.  CT the abdomen and pelvis is notable for small enhancing lesion of the uterus that could represent a fibroid.  Type and screen was performed in the ED, 1 L of normal saline administered, and the patient was started on insulin infusion.  COVID-19 screening test has been ordered and hospitalist consulted for admission.  Review of  Systems:  All other systems reviewed and apart from HPI, are negative.  Past Medical History:  Diagnosis Date  . Arthritis   . History of chicken pox   . Migraines   . Mood disorder (HCC)    anxiety  . Prurigo nodularis    with diabetic dermopathy  . Type 1 diabetes, uncontrolled, with neuropathy ([place])    [place]    Past Surgical History:  Procedure Laterality Date  . treadmill stress test  01/2013   WNL, low risk study     reports that she has never smoked. She has never used smokeless tobacco. She reports current alcohol use. She reports that she does not use drugs.  Allergies  Allergen Reactions  . Amoxicillin Itching  . Gabapentin Other (See Comments)    Dizziness  . Lantus [Insulin Glargine] Itching and Rash    Family History  Problem Relation Age of Onset  . Diabetes Mother        type 2  . Hypertension Mother   . Thyroid disease Mother   . Bipolar disorder Mother   . Heart disease Mother   . Calcium disorder Mother   . Cancer Maternal Grandmother        Breast, stomach  . Cancer Paternal Grandmother        stomach, lung (smoker)  . Diabetes Paternal Grandmother   . Diabetes Paternal Grandfather   . CAD Sister        stent  . Stroke Maternal Aunt   . Cancer Maternal Uncle        prostate  . CAD Maternal Aunt  stents  . Cancer Maternal Aunt 64       ovarian     Prior to Admission medications   Medication Sig Start Date End Date Taking? Authorizing Provider  Blood Glucose Monitoring Suppl (BLOOD GLUCOSE METER KIT AND SUPPLIES) Dispense based on patient and insurance preference. Use up to four times daily as directed. (FOR ICD-9 250.00, 250.01). 06/15/14  Yes Ria Bush, MD  Blood Glucose Monitoring Suppl (ONETOUCH VERIO) w/Device KIT Use as directed to check blood sugar 04/23/19  Yes Fulp, Cammie, MD  DULoxetine (CYMBALTA) 60 MG capsule Take 60 mg by mouth daily.   Yes [provider]  glucagon (GLUCAGON EMERGENCY) 1 MG injection  Inject 1 mg into the muscle once as needed. 12/02/12  Yes Philemon Kingdom, MD  glucose blood (ACCU-CHEK ACTIVE STRIPS) test strip Check 3-4 times daily for uncontrolled insulin dependent type 1 diabetes 06/15/14  Yes Ria Bush, MD  glucose blood [facility] VERIO) test strip Use as instructed to check blood sugar 4 times daily 04/23/19  Yes Fulp, Cammie, MD  hydrOXYzine (VISTARIL) 50 MG capsule Take 50 mg by mouth every 6 (six) hours as needed for anxiety.  08/31/19  Yes [provider]  insulin aspart (NOVOLOG FLEXPEN) 100 UNIT/ML FlexPen Inject 8 units into the skin at breakfast, 12 units at lunch and 8 units at dinner. Patient taking differently: Inject 8-12 Units into the skin See admin instructions. Inject 8 units into the skin at breakfast, 12 units at lunch and 8 units at dinner. 04/23/19  Yes Fulp, Cammie, MD  insulin degludec (TRESIBA FLEXTOUCH) 100 UNIT/ML FlexTouch Pen Inject 50 Units into the skin at bedtime.   Yes [provider]  Insulin Pen Needle (PEN NEEDLES) 31G X 5 MM MISC 1 each by Does not apply route 6 (six) times daily. 04/24/19  Yes Fulp, Cammie, MD  lactulose (CHRONULAC) 10 GM/15ML solution Take 15 mLs by mouth daily as needed for constipation.   Yes [provider]  Lancets (ACCU-CHEK SOFT TOUCH) lancets Check 3-4 times daily for uncontrolled insulin dependent type 1 diabetes 06/15/14  Yes Ria Bush, MD  Magnesium Hydroxide (DULCOLAX PO) Take 2 tablets by mouth daily. Chewable   Yes [provider]  metFORMIN (GLUCOPHAGE) 1000 MG tablet Take 1,000 mg by mouth 2 (two) times daily. 05/29/19  Yes [provider]  Multiple Vitamins-Minerals (RA ONE DAILY GUMMY VITES) CHEW Chew 1 tablet by mouth daily.   Yes [provider]  OneTouch Delica Lancets 09X MISC Use when checking blood sugars 4 times daily 04/23/19  Yes Fulp, Cammie, MD  Probiotic Product (PROBIOTIC PO) Take 1 tablet by mouth daily. gummies   Yes [provider]  Psyllium (METAMUCIL PO) Take 1 packet by mouth daily. Mix with water   Yes [provider]  Vitamin D, Ergocalciferol, (DRISDOL) 1.25 MG (50000 UT) CAPS capsule Take 50,000 Units by mouth every 7 (seven) days.   Yes [provider]    Physical Exam: Vitals:   10/11/19 1615 10/11/19 1719 10/11/19 1729 10/11/19 1745  BP: (!) 112/58   (!) 150/89  Pulse: (!) 108 (!) 107  (!) 105  Resp:  13  16  Temp:      TempSrc:      SpO2: 97% 98%  96%  Weight:   59 kg   Height:   6' (1.829 m)     Constitutional: NAD, calm  Eyes: PERTLA, lids and conjunctivae normal ENMT: Mucous membranes are moist. Posterior pharynx clear of  any exudate or lesions.   Neck: normal, supple, no masses, no thyromegaly Respiratory:  no wheezing, no crackles. No accessory muscle use.  Cardiovascular: Rate ~110 and regular. No extremity edema. No significant JVD. Abdomen: No distension, no tenderness, soft. Bowel sounds active.  Musculoskeletal: no clubbing / cyanosis. No joint deformity upper and lower extremities.   Skin: no significant rashes, lesions, ulcers. Warm, dry, well-perfused. Neurologic: No facial asymmetry. Sensation intact. Moving all extremities.  Psychiatric: Alert and oriented, appropriate throughout interview and exam. Pleasant and cooperative.    Labs and Imaging on Admission: I have personally reviewed following labs and imaging studies  CBC: Recent Labs  Lab 10/11/19 1522  WBC 6.4  HGB 11.9*  HCT 36.8  MCV 90.9  PLT 119   Basic Metabolic Panel: Recent Labs  Lab 10/11/19 1522  NA 127*  K 4.8  CL 93*  CO2 22  GLUCOSE 908*  BUN 14  CREATININE 1.13*  CALCIUM 9.5   GFR: Estimated Creatinine Clearance: 56.1 mL/min (A) (by C-G formula based on SCr of 1.13 mg/dL (H)). Liver Function Tests: Recent Labs  Lab 10/11/19 1522  AST 11*  ALT 15  ALKPHOS 95  BILITOT 0.9  PROT 7.4  ALBUMIN 2.8*   No results for input(s): LIPASE, AMYLASE in the last 168  hours. No results for input(s): AMMONIA in the last 168 hours. Coagulation Profile: No results for input(s): INR, PROTIME in the last 168 hours. Cardiac Enzymes: No results for input(s): CKTOTAL, CKMB, CKMBINDEX, TROPONINI in the last 168 hours. BNP (last 3 results) No results for input(s): PROBNP in the last 8760 hours. HbA1C: No results for input(s): HGBA1C in the last 72 hours. CBG: Recent Labs  Lab 10/11/19 1728 10/11/19 1853 10/11/19 1928  GLUCAP >600* 477* 517*   Lipid Profile: No results for input(s): CHOL, HDL, LDLCALC, TRIG, CHOLHDL, LDLDIRECT in the last 72 hours. Thyroid Function Tests: No results for input(s): TSH, T4TOTAL, FREET4, T3FREE, THYROIDAB in the last 72 hours. Anemia Panel: No results for input(s): VITAMINB12, FOLATE, FERRITIN, TIBC, IRON, RETICCTPCT in the last 72 hours. Urine analysis:    Component Value Date/Time   COLORURINE STRAW (A) 10/11/2019 1745   APPEARANCEUR CLEAR 10/11/2019 1745   LABSPEC 1.023 10/11/2019 1745   PHURINE 5.0 10/11/2019 1745   GLUCOSEU >=500 (A) 10/11/2019 1745   HGBUR SMALL (A) 10/11/2019 1745   BILIRUBINUR NEGATIVE 10/11/2019 1745   BILIRUBINUR negative 04/23/2019 1658   KETONESUR 5 (A) 10/11/2019 1745   PROTEINUR 100 (A) 10/11/2019 1745   UROBILINOGEN 0.2 04/23/2019 1658   UROBILINOGEN 1.0 08/11/2008 1745   NITRITE NEGATIVE 10/11/2019 1745   LEUKOCYTESUR NEGATIVE 10/11/2019 1745   Sepsis Labs: _0 (procalcitonin:4,lacticidven:4) ) Recent Results (from the past 240 hour(s))  Wet prep, genital     Status: Abnormal   Collection Time: 10/11/19  5:45 PM   Specimen: Cervix  Result Value Ref Range Status   Yeast Wet Prep HPF POC NONE SEEN NONE SEEN Final   Trich, Wet Prep NONE SEEN NONE SEEN Final   Clue Cells Wet Prep HPF POC NONE SEEN NONE SEEN Final   WBC, Wet Prep HPF POC MANY (A) NONE SEEN Final   Sperm NONE SEEN  Final    Comment: Performed at [facility], [place] [address] [place], [place] [address]      Radiological Exams on Admission: CT ABDOMEN PELVIS W CONTRAST  Result Date: 10/11/2019 CLINICAL DATA:  GI and vaginal bleeding, weight loss EXAM: CT ABDOMEN AND PELVIS WITH CONTRAST  TECHNIQUE: Multidetector CT imaging of the abdomen and pelvis was performed using the standard protocol following bolus administration of intravenous contrast. CONTRAST:  125m OMNIPAQUE IOHEXOL 300 MG/ML  SOLN COMPARISON:  None. FINDINGS: Lower chest: No acute abnormality. Hepatobiliary: No focal liver abnormality is seen. Gallbladder is contracted. No biliary dilatation. Pancreas: Unremarkable. Spleen: Unremarkable. Adrenals/Urinary Tract: Kidneys and adrenals are unremarkable. Bladder is distended. Stomach/Bowel: Stomach appears within normal limits. Bowel is normal in caliber. Normal appendix. Vascular/Lymphatic: No significant vascular findings are present. No enlarged abdominal or pelvic lymph nodes. Reproductive: A 1.4 cm enhancing lesion of right uterine body may reflect a fibroid. No adnexal mass. Other: No abdominal wall hernia or abnormality. No abdominopelvic ascites. Musculoskeletal: No acute or significant osseous findings. IMPRESSION: No acute abnormality. Small enhancing lesion of the uterus, which may reflect a fibroid. Consider pelvic ultrasound for additional evaluation. Electronically Signed   By: PMacy MisM.D.   On: 10/11/2019 18:36    EKG: Independently reviewed. Sinus tachycardia (rate 108), T-wave inversions.   Assessment/Plan   1. Uncontrolled IDDM with hyperglycemia  - Presents with lightheadedness, rectal and vaginal bleeding, notes that she has been rationing her insulin due to no PCP follow-up, and is found to have serum glucose 908 without acidosis  - She was given NS bolus in ED and started on insulin infusion  - Continue IVF hydration, continue insulin infusion with frequent CBG's and repeat chem panel, consult with diabetes coordinator   2. Rectal bleeding  - Patient reports a  few episodes of BRBPR for 2 days, most recently yesterday, but no abd pain and no N/V - FOBT was positive in ED, no acute findings on CT abd/pelvis  - She also has DUB as below, will check INR, repeat CBC (dilutional drop anticipated), fortunately no bleeding today, may have been diverticular based on description    3. Dysfunctional uterine bleeding  - Patient reports vaginal bleeding several days per week x2 months  - Possible fibroid on CT abd/pelvis  - Check pelvic [place] TSH, INR   4. Mild renal insufficiency  - SCr is 1.13 on admission, up from 0.73 in September 2020  - Likely prerenal azotemia in setting of uncontrolled blood sugars and osmotic diuresis, anticipate improvement with glycemic-control and fluid-resuscitation  - Continue IVF hydration, repeat chem panel in am    5. Depression, anxiety  - Continue Cymbalta and as-needed hydroxyzine     DVT prophylaxis: SCDs Code Status: Full  Family Communication: Discussed with patient   Disposition Plan: Likely back home once glycemic-control achieved and blood counts stable; will need PCP follow-up and may need GI referral  Consults called: None  Admission status: Observation     TVianne Bulls MD [facility] Pager: See [email or web address]  If 7AM-7PM, please contact the daytime attending [email or web address]  10/11/2019, 7:32 PM

## 2019-10-11 NOTE — Progress Notes (Signed)
CRITICAL VALUE STICKER  CRITICAL VALUE:Results for Megan, Collins (MRN [id number]) as of 10/11/2019 22:50  Ref. Range 10/11/2019 17:35  Osmolality Latest Ref Range: 275 - 295 mOsm/kg 324 (HH)    RECEIVER (on-site recipient of call): Greig Right, RN  TIME NOTIFIED: 2246  MD NOTIFIED: Tylene Fantasia  TIME OF NOTIFICATION:2250  RESPONSE: Awaiting

## 2019-10-11 NOTE — ED Notes (Signed)
Pt transported via cart to 3W34. Pt conscious, breathing, and A&Ox4. No distress noted. All belongings with pt. Pt on monitors.

## 2019-10-11 NOTE — ED Triage Notes (Signed)
Pt here for dizziness x 2 days, worsening today to where she almost passed out at work. Pt having blood in stool x 2 days. Pt sts for the last two months she has 4-5 days of vaginal bleeding each week. Per her sister, pt's complexion more pale than normal.

## 2019-10-11 NOTE — ED Notes (Signed)
Pt care endorsed from Production assistant, radio.

## 2019-10-11 NOTE — ED Notes (Signed)
RN to RN report given to [facility] on 3W with no questions or concerns.

## 2019-10-12 DIAGNOSIS — N938 Other specified abnormal uterine and vaginal bleeding: Secondary | ICD-10-CM | POA: Diagnosis present

## 2019-10-12 DIAGNOSIS — R197 Diarrhea, unspecified: Secondary | ICD-10-CM | POA: Diagnosis not present

## 2019-10-12 DIAGNOSIS — E1040 Type 1 diabetes mellitus with diabetic neuropathy, unspecified: Secondary | ICD-10-CM | POA: Diagnosis present

## 2019-10-12 DIAGNOSIS — N939 Abnormal uterine and vaginal bleeding, unspecified: Secondary | ICD-10-CM

## 2019-10-12 DIAGNOSIS — Z833 Family history of diabetes mellitus: Secondary | ICD-10-CM | POA: Diagnosis not present

## 2019-10-12 DIAGNOSIS — E1165 Type 2 diabetes mellitus with hyperglycemia: Secondary | ICD-10-CM

## 2019-10-12 DIAGNOSIS — Z20822 Contact with and (suspected) exposure to covid-19: Secondary | ICD-10-CM | POA: Diagnosis present

## 2019-10-12 DIAGNOSIS — F329 Major depressive disorder, single episode, unspecified: Secondary | ICD-10-CM | POA: Diagnosis present

## 2019-10-12 DIAGNOSIS — E1065 Type 1 diabetes mellitus with hyperglycemia: Secondary | ICD-10-CM | POA: Diagnosis present

## 2019-10-12 DIAGNOSIS — N179 Acute kidney failure, unspecified: Secondary | ICD-10-CM

## 2019-10-12 DIAGNOSIS — Z794 Long term (current) use of insulin: Secondary | ICD-10-CM | POA: Diagnosis not present

## 2019-10-12 DIAGNOSIS — D638 Anemia in other chronic diseases classified elsewhere: Secondary | ICD-10-CM | POA: Diagnosis present

## 2019-10-12 DIAGNOSIS — R739 Hyperglycemia, unspecified: Secondary | ICD-10-CM | POA: Diagnosis present

## 2019-10-12 DIAGNOSIS — E876 Hypokalemia: Secondary | ICD-10-CM | POA: Diagnosis present

## 2019-10-12 DIAGNOSIS — K921 Melena: Secondary | ICD-10-CM | POA: Diagnosis present

## 2019-10-12 DIAGNOSIS — D649 Anemia, unspecified: Secondary | ICD-10-CM

## 2019-10-12 DIAGNOSIS — Z9114 Patient's other noncompliance with medication regimen: Secondary | ICD-10-CM | POA: Diagnosis not present

## 2019-10-12 DIAGNOSIS — F419 Anxiety disorder, unspecified: Secondary | ICD-10-CM | POA: Diagnosis present

## 2019-10-12 LAB — RETICULOCYTES
Immature Retic Fract: 20.6 % — ABNORMAL HIGH (ref 2.3–15.9)
RBC.: 3.23 MIL/uL — ABNORMAL LOW (ref 3.87–5.11)
Retic Count, Absolute: 63.3 10*3/uL (ref 19.0–186.0)
Retic Ct Pct: 2 % (ref 0.4–3.1)

## 2019-10-12 LAB — GLUCOSE, CAPILLARY
Glucose-Capillary: 122 mg/dL — ABNORMAL HIGH (ref 70–99)
Glucose-Capillary: 128 mg/dL — ABNORMAL HIGH (ref 70–99)
Glucose-Capillary: 130 mg/dL — ABNORMAL HIGH (ref 70–99)
Glucose-Capillary: 134 mg/dL — ABNORMAL HIGH (ref 70–99)
Glucose-Capillary: 141 mg/dL — ABNORMAL HIGH (ref 70–99)
Glucose-Capillary: 147 mg/dL — ABNORMAL HIGH (ref 70–99)
Glucose-Capillary: 153 mg/dL — ABNORMAL HIGH (ref 70–99)
Glucose-Capillary: 182 mg/dL — ABNORMAL HIGH (ref 70–99)
Glucose-Capillary: 187 mg/dL — ABNORMAL HIGH (ref 70–99)
Glucose-Capillary: 189 mg/dL — ABNORMAL HIGH (ref 70–99)
Glucose-Capillary: 218 mg/dL — ABNORMAL HIGH (ref 70–99)
Glucose-Capillary: 219 mg/dL — ABNORMAL HIGH (ref 70–99)
Glucose-Capillary: 239 mg/dL — ABNORMAL HIGH (ref 70–99)

## 2019-10-12 LAB — CBC WITH DIFFERENTIAL/PLATELET
Abs Immature Granulocytes: 0.01 10*3/uL (ref 0.00–0.07)
Basophils Absolute: 0 10*3/uL (ref 0.0–0.1)
Basophils Relative: 0 %
Eosinophils Absolute: 0.1 10*3/uL (ref 0.0–0.5)
Eosinophils Relative: 2 %
HCT: 28.6 % — ABNORMAL LOW (ref 36.0–46.0)
Hemoglobin: 9.6 g/dL — ABNORMAL LOW (ref 12.0–15.0)
Immature Granulocytes: 0 %
Lymphocytes Relative: 33 %
Lymphs Abs: 1.8 10*3/uL (ref 0.7–4.0)
MCH: 29.2 pg (ref 26.0–34.0)
MCHC: 33.6 g/dL (ref 30.0–36.0)
MCV: 86.9 fL (ref 80.0–100.0)
Monocytes Absolute: 0.5 10*3/uL (ref 0.1–1.0)
Monocytes Relative: 9 %
Neutro Abs: 3.1 10*3/uL (ref 1.7–7.7)
Neutrophils Relative %: 56 %
Platelets: 243 10*3/uL (ref 150–400)
RBC: 3.29 MIL/uL — ABNORMAL LOW (ref 3.87–5.11)
RDW: 13.2 % (ref 11.5–15.5)
WBC: 5.6 10*3/uL (ref 4.0–10.5)
nRBC: 0 % (ref 0.0–0.2)

## 2019-10-12 LAB — C DIFFICILE QUICK SCREEN W PCR REFLEX
C Diff antigen: NEGATIVE
C Diff interpretation: NOT DETECTED
C Diff toxin: NEGATIVE

## 2019-10-12 LAB — CBC
HCT: 29.2 % — ABNORMAL LOW (ref 36.0–46.0)
Hemoglobin: 9.9 g/dL — ABNORMAL LOW (ref 12.0–15.0)
MCH: 29.4 pg (ref 26.0–34.0)
MCHC: 33.9 g/dL (ref 30.0–36.0)
MCV: 86.6 fL (ref 80.0–100.0)
Platelets: 235 10*3/uL (ref 150–400)
RBC: 3.37 MIL/uL — ABNORMAL LOW (ref 3.87–5.11)
RDW: 13.6 % (ref 11.5–15.5)
WBC: 5.3 10*3/uL (ref 4.0–10.5)
nRBC: 0 % (ref 0.0–0.2)

## 2019-10-12 LAB — LIPID PANEL
Cholesterol: 228 mg/dL — ABNORMAL HIGH (ref 0–200)
HDL: 44 mg/dL (ref >40)
LDL Cholesterol: 168 mg/dL — ABNORMAL HIGH (ref 0–99)
Total CHOL/HDL Ratio: 5.2 RATIO
Triglycerides: 81 mg/dL (ref <150)
VLDL: 16 mg/dL (ref 0–40)

## 2019-10-12 LAB — BASIC METABOLIC PANEL
Anion gap: 9 (ref 5–15)
Anion gap: 9 (ref 5–15)
BUN: 8 mg/dL (ref 6–20)
BUN: 6 mg/dL (ref 6–20)
CO2: 25 mmol/L (ref 22–32)
CO2: 25 mmol/L (ref 22–32)
Calcium: 8.8 mg/dL — ABNORMAL LOW (ref 8.9–10.3)
Calcium: 8.5 mg/dL — ABNORMAL LOW (ref 8.9–10.3)
Chloride: 104 mmol/L (ref 98–111)
Chloride: 104 mmol/L (ref 98–111)
Creatinine, Ser: 0.56 mg/dL (ref 0.44–1.00)
Creatinine, Ser: 0.54 mg/dL (ref 0.44–1.00)
GFR calc Af Amer: >60 mL/min (ref >60)
GFR calc Af Amer: >60 mL/min (ref >60)
GFR calc non Af Amer: >60 mL/min (ref >60)
GFR calc non Af Amer: >60 mL/min (ref >60)
Glucose, Bld: 251 mg/dL — ABNORMAL HIGH (ref 70–99)
Glucose, Bld: 201 mg/dL — ABNORMAL HIGH (ref 70–99)
Potassium: 3.4 mmol/L — ABNORMAL LOW (ref 3.5–5.1)
Potassium: 3.1 mmol/L — ABNORMAL LOW (ref 3.5–5.1)
Sodium: 138 mmol/L (ref 135–145)
Sodium: 138 mmol/L (ref 135–145)

## 2019-10-12 LAB — TSH: TSH: 1.016 u[IU]/mL (ref 0.350–4.500)

## 2019-10-12 LAB — IRON AND TIBC
Iron: 62 ug/dL (ref 28–170)
Saturation Ratios: 26 % (ref 10.4–31.8)
TIBC: 238 ug/dL — ABNORMAL LOW (ref 250–450)
UIBC: 176 ug/dL

## 2019-10-12 LAB — SARS CORONAVIRUS 2 (TAT 6-24 HRS): SARS Coronavirus 2: NEGATIVE

## 2019-10-12 LAB — HEMOGLOBIN A1C
Hgb A1c MFr Bld: 14.4 % — ABNORMAL HIGH (ref 4.8–5.6)
Mean Plasma Glucose: 366.58 mg/dL

## 2019-10-12 LAB — PROTIME-INR
INR: 0.9 (ref 0.8–1.2)
Prothrombin Time: 11.5 seconds (ref 11.4–15.2)

## 2019-10-12 LAB — VITAMIN B12: Vitamin B-12: 803 pg/mL (ref 180–914)

## 2019-10-12 LAB — FERRITIN: Ferritin: 11 ng/mL (ref 11–307)

## 2019-10-12 LAB — FOLATE: Folate: 10.3 ng/mL (ref >5.9)

## 2019-10-12 LAB — MAGNESIUM: Magnesium: 1.5 mg/dL — ABNORMAL LOW (ref 1.7–2.4)

## 2019-10-12 LAB — HIV ANTIBODY (ROUTINE TESTING W REFLEX): HIV Screen 4th Generation wRfx: NONREACTIVE

## 2019-10-12 MED ORDER — SENNOSIDES-DOCUSATE SODIUM 8.6-50 MG PO TABS: 1.0000 | ORAL_TABLET | Freq: Two times a day (BID) | ORAL | 1 refills | Status: DC | PRN

## 2019-10-12 MED ORDER — INSULIN ASPART 100 UNIT/ML ~~LOC~~ SOLN: 0.0000 [IU] | Freq: Every day | SUBCUTANEOUS | Status: DC

## 2019-10-12 MED ORDER — MAGNESIUM SULFATE 2 GM/50ML IV SOLN
2.0000 g | Freq: Once | INTRAVENOUS | Status: AC
  Administered 2019-10-12: 2 g via INTRAVENOUS
  Filled 2019-10-12: qty 50

## 2019-10-12 MED ORDER — POTASSIUM CHLORIDE IN NACL 20-0.9 MEQ/L-% IV SOLN
INTRAVENOUS | Status: DC
  Filled 2019-10-12 (×2): qty 1000

## 2019-10-12 MED ORDER — ONDANSETRON HCL 4 MG/2ML IJ SOLN: 4.0000 mg | Freq: Three times a day (TID) | INTRAMUSCULAR | Status: DC | PRN

## 2019-10-12 MED ORDER — GLUCAGON (RDNA) 1 MG IJ KIT: PACK | INTRAMUSCULAR | 1 refills | Status: DC

## 2019-10-12 MED ORDER — PEN NEEDLES 31G X 5 MM MISC: 1.0000 | Freq: Every day | 11 refills | Status: DC

## 2019-10-12 MED ORDER — INSULIN ASPART 100 UNIT/ML ~~LOC~~ SOLN
0.0000 [IU] | Freq: Three times a day (TID) | SUBCUTANEOUS | Status: DC
  Administered 2019-10-12 (×2): 2 [IU] via SUBCUTANEOUS
  Administered 2019-10-13: 3 [IU] via SUBCUTANEOUS

## 2019-10-12 MED ORDER — FERROUS SULFATE 325 (65 FE) MG PO TABS: 325.0000 mg | ORAL_TABLET | Freq: Every day | ORAL | 1 refills | Status: DC

## 2019-10-12 MED ORDER — POTASSIUM CHLORIDE CRYS ER 20 MEQ PO TBCR
40.0000 meq | EXTENDED_RELEASE_TABLET | ORAL | Status: AC
  Administered 2019-10-12 (×2): 40 meq via ORAL
  Filled 2019-10-12 (×2): qty 2

## 2019-10-12 MED ORDER — NOVOLOG FLEXPEN 100 UNIT/ML ~~LOC~~ SOPN: 9.0000 [IU] | PEN_INJECTOR | Freq: Three times a day (TID) | SUBCUTANEOUS | 1 refills | Status: DC

## 2019-10-12 MED ORDER — TRESIBA FLEXTOUCH 100 UNIT/ML ~~LOC~~ SOPN: 50.0000 [IU] | PEN_INJECTOR | Freq: Every day | SUBCUTANEOUS | 1 refills | Status: DC

## 2019-10-12 MED ORDER — PANTOPRAZOLE SODIUM 40 MG IV SOLR
40.0000 mg | INTRAVENOUS | Status: DC
  Administered 2019-10-12 – 2019-10-13 (×2): 40 mg via INTRAVENOUS
  Filled 2019-10-12 (×2): qty 40

## 2019-10-12 MED ORDER — BLOOD GLUCOSE MONITOR KIT: PACK | 0 refills | Status: DC

## 2019-10-12 MED ORDER — ATORVASTATIN CALCIUM 20 MG PO TABS: 20.0000 mg | ORAL_TABLET | Freq: Every day | ORAL | 1 refills | Status: DC

## 2019-10-12 MED ORDER — INSULIN ASPART 100 UNIT/ML ~~LOC~~ SOLN
4.0000 [IU] | Freq: Three times a day (TID) | SUBCUTANEOUS | Status: DC
  Administered 2019-10-12 – 2019-10-13 (×3): 4 [IU] via SUBCUTANEOUS

## 2019-10-12 MED ORDER — LOPERAMIDE HCL 2 MG PO CAPS: 2.0000 mg | ORAL_CAPSULE | ORAL | Status: DC | PRN

## 2019-10-12 MED ORDER — ACETAMINOPHEN 325 MG PO TABS: 650.0000 mg | ORAL_TABLET | Freq: Four times a day (QID) | ORAL | Status: DC | PRN

## 2019-10-12 MED ORDER — INSULIN DETEMIR 100 UNIT/ML ~~LOC~~ SOLN
25.0000 [IU] | Freq: Two times a day (BID) | SUBCUTANEOUS | Status: DC
  Administered 2019-10-12 – 2019-10-13 (×3): 25 [IU] via SUBCUTANEOUS
  Filled 2019-10-12 (×5): qty 0.25

## 2019-10-12 NOTE — TOC Initial Note (Signed)
Transition of Care [facility]) - Initial/Assessment Note    Patient Details  Name: [name] MRN: [id number] Date of Birth: [birth year]  [facility]) CM/SW Contact:    Angelita Ingles, RN Phone Number: [phone] 10/12/2019, 3:26 PM  Clinical Narrative:                 Patient to discharge home. Glucometer to be delivered to the room. Patient provided with coupons for discharge medications co pay discounts. [facility] pharmacy to deliver to the room. No further needs noted at this time.   Expected Discharge Plan: Home/Self Care Barriers to Discharge: No Barriers Identified   Patient Goals and CMS Choice        Expected Discharge Plan and Services Expected Discharge Plan: Home/Self Care   Discharge Planning Services: Follow-up appt scheduled([facility] [phone] 10/29/2019)     Expected Discharge Date: 10/12/19                                    Prior Living Arrangements/Services   Lives with:: Self, Adult Children, Minor Children Patient language and need for interpreter reviewed:: Yes Do you feel safe going back to the place where you live?: Yes      Need for Family Participation in Patient Care: No (Comment) Care giver support system in place?: Yes (comment)   Criminal Activity/Legal Involvement Pertinent to Current Situation/Hospitalization: No - Comment as needed  Activities of Daily Living Home Assistive Devices/Equipment: None, Communication device (specify type) ADL Screening (condition at time of admission) Patient's cognitive ability adequate to safely complete daily activities?: Yes Is the patient deaf or have difficulty hearing?: No Does the patient have difficulty seeing, even when wearing glasses/contacts?: No Does the patient have difficulty concentrating, remembering, or making decisions?: No Patient able to express need for assistance with ADLs?: Yes Does the patient have difficulty dressing or bathing?: No Independently  performs ADLs?: Yes (appropriate for developmental age) Does the patient have difficulty walking or climbing stairs?: No Weakness of Legs: None Weakness of Arms/Hands: None  Permission Sought/Granted                  Emotional Assessment Appearance:: Appears stated age Attitude/Demeanor/Rapport: Gracious Affect (typically observed): Calm Orientation: : Oriented to Self, Oriented to Place, Oriented to Situation, Oriented to  Time   Psych Involvement: No (comment)  Admission diagnosis:  Dysfunctional uterine bleeding [N93.8] Lower GI bleed [K92.2] Weight loss [R63.4] Hyperglycemia [R73.9] Near syncope [R55] Uncontrolled type 1 diabetes mellitus with hyperglycemia, with long-term current use of insulin ([place]) [E10.65] Patient Active Problem List   Diagnosis Date Noted  . Uncontrolled type 1 diabetes mellitus with hyperglycemia, with long-term current use of insulin ([place]) 10/11/2019  . Rectal bleeding 10/11/2019  . Dysfunctional uterine bleeding 10/11/2019  . Mild renal insufficiency 10/11/2019  . Intractable nausea and vomiting   . Vomiting 03/30/2019  . Complicated grieving 98/33/8250  . Ulcers of both lower legs ([place]) 02/20/2014  . Health maintenance examination 09/20/2013  . Diabetic ulcer of left foot ([place]) 05/12/2013  . Diabetic foot ulcers ([place]) 05/01/2013  . Skin rash 01/05/2013  . Contraception management 12/31/2012  . Chest pain 12/30/2012  . Mood disorder ([place]) 12/30/2012  . Unspecified vitamin D deficiency 12/03/2012  . H/O thyroid nodule 12/03/2012  . Type 1 diabetes mellitus with diabetic neuropathy ([place]) 06/18/2007   PCP:  Patient, No Pcp Per Pharmacy:   [id number] -  [place], [place] [address] [place] [address] Phone: [phone] Fax: [phone]  [facility] [place], [place] - [address] [place] [address] Phone:  [phone] Fax: [phone]     Social Determinants of Health (SDOH) Interventions    Readmission Risk Interventions No flowsheet data found.

## 2019-10-12 NOTE — Progress Notes (Signed)
PROGRESS NOTE  [name] VOH:[id number] DOB: 10-17-1970   PCP: Patient, No Pcp Per  Patient is from: Home.  DOA: 10/11/2019 LOS: 0  Brief Narrative / Interim history: 49 year old female with history of type 1 diabetes, anxiety, depression and constipation presenting with lightheadedness, rectal bleeding and abnormal vaginal bleeding.  Has no PCP.  She has been rationing her insulin.   In ED, slightly tachycardic with stable blood pressure.  Hgb 11.9 (about baseline). Na 127.  Glucose 908.  Cr4 1.13.  Bicarb 22.  Anion gap 12.  UA with> 500 glucose, 5 ketones, small Hgb and protein.  COVID-19 negative.  I stat hCG negative.  FOBT positive.  Wet prep not impressive.  CT abdomen and TVUS without significant finding other than possible fibroids.  EKG with sinus tachycardia to 108, LVH and TWI.   Admitted for hyperglycemia, possible GI bleed and AUB.  Started on insulin drip and IV fluids.  The next morning, hyperglycemia improved.  Transitioned to subcu insulin. However, patient had 5 episodes of large watery bowel movements and nausea throughout the day.  Subjective: Seen and examined this morning.  No major events overnight or this morning.  She states she has not had vaginal bleeding in a week and rectal bleeding 3 days.  Reports some abdominal discomfort.  She thinks is because she is hungry.  Denies chest pain, dyspnea or headache.  Objective: Vitals:   10/12/19 0500 10/12/19 0800 10/12/19 1209 10/12/19 1619  BP: 121/77 132/82 136/86 (!) 154/100  Pulse: 92 88 98 (!) 102  Resp: $Remo'17 20 20 20  'BcHZt$ Temp: 98.3 F (36.8 C) 98.7 F (37.1 C) 98.9 F (37.2 C) 97.8 F (36.6 C)  TempSrc: Oral Oral Oral   SpO2: 99% 100% 100% 100%  Weight:      Height:        Intake/Output Summary (Last 24 hours) at 10/12/2019 1628 Last data filed at 10/12/2019 1500 Gross per 24 hour  Intake 2653.02 ml  Output --  Net 2653.02 ml   Filed Weights   10/11/19 1729 10/11/19 2329  Weight: 59 kg 70 kg     Examination:  GENERAL: No acute distress.  Appears well.  HEENT: MMM.  Vision and hearing grossly intact.  NECK: Supple.  No apparent JVD.  RESP:  No IWOB. Good air movement bilaterally. CVS:  RRR. Heart sounds normal.  ABD/GI/GU: Bowel sounds present. Soft. Non tender.  MSK/EXT:  Moves extremities. No apparent deformity. No edema.  SKIN: no apparent skin lesion or wound NEURO: Awake, alert and oriented appropriately.  No apparent focal neuro deficit. PSYCH: Calm. Normal affect.   Procedures:  None  Assessment & Plan: Uncontrolled DM-1 with hyperglycemia: A1c 14.4%.  Likely due to noncompliance.  Has been rationing her insulin.  She has no PCP. Recent Labs    10/12/19 1037 10/12/19 1150 10/12/19 1622  GLUCAP 122* 130* 134*  -Transitioned to subcu insulin-Lantus 25 units twice daily, SSI moderate and NovoLog AC 4 units -Started started -Discussed the importance of good compliance with her medications -Diabetic coordinator, dietitian and [facility] consulted. -Continue IV fluid in the setting of nausea and diarrhea.  Nausea and diarrhea: Patient developed nausea and multiple episodes of watery bowel movements throughout the day.  Abdominal exam benign.  This could be due to uncontrolled diabetes or contrast media she received for CT -Continue IV fluid as above -Antiemetics -Check C. Difficile -Recheck H&H  Hematochezia: Reports history of constipation.  Last episode 2 days ago, on Sunday.  Now with diarrhea.  Hemoglobin slightly dropped likely dilutional from IV fluid versus bleeding.  She states she has bowel movement without hematochezia or melena here. -Monitor H&H  Abnormal uterine bleeding: Reports heavy bleeding weekly for the last 2 months.  She states no vaginal bleeding in the last 1 week.  CT abdomen and pelvis and TVUS without significant finding other than a small fibroid.  Not sure about her last Pap smear. -Ambulatory referral to gynecology on discharge -She would  definitely need Pap smear  AKI: Likely due to hyperglycemia.  Resolved. -Continue monitoring  Depression and anxiety: Stable -Continue home medications  Hypokalemia/hypomagnesemia -Replenish and recheck  Normocytic anemia-Hgb dropped about 1 g likely dilutional.  Anemia panel consistent with anemia of chronic disease. -Continue monitoring -P.o. ferrous sulfate on discharge                DVT prophylaxis: SCD Code Status: Full code Family Communication: Patient and/or RN. Available if any question.  Discharge barrier: Ongoing nausea and diarrhea requiring IV fluid and patient with hyperglycemic crisis Patient is from: Home Final disposition: Likely home in the morning if diarrhea and nausea subsides  Consultants: None   Microbiology summarized: COVID-19 negative  Sch Meds:  Scheduled Meds: . DULoxetine  60 mg Oral Daily  . insulin aspart  0-15 Units Subcutaneous TID WC  . insulin aspart  0-5 Units Subcutaneous QHS  . insulin aspart  4 Units Subcutaneous TID WC  . insulin detemir  25 Units Subcutaneous BID  . pantoprazole (PROTONIX) IV  40 mg Intravenous Q24H  . potassium chloride  40 mEq Oral Q3H   Continuous Infusions: . 0.9 % NaCl with KCl 20 mEq / L 75 mL/hr at 10/12/19 1500   PRN Meds:.acetaminophen, dextrose, hydrOXYzine  Antimicrobials: Anti-infectives (From admission, onward)   None       I have personally reviewed the following labs and images: CBC: Recent Labs  Lab 10/11/19 1522 10/12/19 0000  WBC 6.4 5.6  NEUTROABS  --  3.1  HGB 11.9* 9.6*  HCT 36.8 28.6*  MCV 90.9 86.9  PLT 282 243   BMP &GFR Recent Labs  Lab 10/11/19 1522 10/12/19 0000 10/12/19 0834  NA 127* 138 138  K 4.8 3.4* 3.1*  CL 93* 104 104  CO2 $Re'22 25 25  'YCf$ GLUCOSE 908* 251* 201*  BUN $Re'14 8 6  'wNa$ CREATININE 1.13* 0.56 0.54  CALCIUM 9.5 8.8* 8.5*  MG  --   --  1.5*   Estimated Creatinine Clearance: 94 mL/min (by C-G formula based on SCr of 0.54 mg/dL). Liver &  Pancreas: Recent Labs  Lab 10/11/19 1522  AST 11*  ALT 15  ALKPHOS 95  BILITOT 0.9  PROT 7.4  ALBUMIN 2.8*   No results for input(s): LIPASE, AMYLASE in the last 168 hours. No results for input(s): AMMONIA in the last 168 hours. Diabetic: Recent Labs    10/12/19 0000  HGBA1C 14.4*   Recent Labs  Lab 10/12/19 0724 10/12/19 0832 10/12/19 0934 10/12/19 1037 10/12/19 1150  GLUCAP 239* 187* 147* 122* 130*   Cardiac Enzymes: No results for input(s): CKTOTAL, CKMB, CKMBINDEX, TROPONINI in the last 168 hours. No results for input(s): PROBNP in the last 8760 hours. Coagulation Profile: Recent Labs  Lab 10/12/19 0000  INR 0.9   Thyroid Function Tests: Recent Labs    10/12/19 0000  TSH 1.016   Lipid Profile: Recent Labs    10/12/19 0834  CHOL 228*  HDL 44  LDLCALC 168*  TRIG  81  CHOLHDL 5.2   Anemia Panel: Recent Labs    10/12/19 0957  VITAMINB12 803  FOLATE 10.3  FERRITIN 11  TIBC 238*  IRON 62  RETICCTPCT 2.0   Urine analysis:    Component Value Date/Time   COLORURINE STRAW (A) 10/11/2019 1745   APPEARANCEUR CLEAR 10/11/2019 1745   LABSPEC 1.023 10/11/2019 1745   PHURINE 5.0 10/11/2019 1745   GLUCOSEU >=500 (A) 10/11/2019 1745   HGBUR SMALL (A) 10/11/2019 1745   BILIRUBINUR NEGATIVE 10/11/2019 1745   BILIRUBINUR negative 04/23/2019 1658   KETONESUR 5 (A) 10/11/2019 1745   PROTEINUR 100 (A) 10/11/2019 1745   UROBILINOGEN 0.2 04/23/2019 1658   UROBILINOGEN 1.0 08/11/2008 1745   NITRITE NEGATIVE 10/11/2019 1745   LEUKOCYTESUR NEGATIVE 10/11/2019 1745   Sepsis Labs: Invalid input(s): PROCALCITONIN, [place]  Microbiology: Recent Results (from the past 240 hour(s))  Wet prep, genital     Status: Abnormal   Collection Time: 10/11/19  5:45 PM   Specimen: Cervix  Result Value Ref Range Status   Yeast Wet Prep HPF POC NONE SEEN NONE SEEN Final   Trich, Wet Prep NONE SEEN NONE SEEN Final   Clue Cells Wet Prep HPF POC NONE SEEN NONE SEEN  Final   WBC, Wet Prep HPF POC MANY (A) NONE SEEN Final   Sperm NONE SEEN  Final    Comment: Performed at [facility], [place]. [address], [place], [place] [address]  SARS CORONAVIRUS 2 (TAT 6-24 HRS) Nasopharyngeal Nasopharyngeal Swab     Status: None   Collection Time: 10/11/19  7:18 PM   Specimen: Nasopharyngeal Swab  Result Value Ref Range Status   SARS Coronavirus 2 NEGATIVE NEGATIVE Final    Comment: (NOTE) SARS-CoV-2 target nucleic acids are NOT DETECTED. The SARS-CoV-2 RNA is generally detectable in upper and lower respiratory specimens during the acute phase of infection. Negative results do not preclude SARS-CoV-2 infection, do not rule out co-infections with other pathogens, and should not be used as the sole basis for treatment or other patient management decisions. Negative results must be combined with clinical observations, patient history, and epidemiological information. The expected result is Negative. Fact Sheet for Patients: SugarRoll.be Fact Sheet for Healthcare Providers: https://www.woods-mathews.com/ This test is not yet approved or cleared by the [place] FDA and  has been authorized for detection and/or diagnosis of SARS-CoV-2 by FDA under an Emergency Use Authorization (EUA). This EUA will remain  in effect (meaning this test can be used) for the duration of the COVID-19 declaration under Section 56 4(b)(1) of the Act, 21 U.S.C. section 360bbb-3(b)(1), unless the authorization is terminated or revoked sooner. Performed at [facility], [place] [address], [place], [place] [address]     Radiology Studies: CT ABDOMEN PELVIS W CONTRAST  Result Date: 10/11/2019 CLINICAL DATA:  GI and vaginal bleeding, weight loss EXAM: CT ABDOMEN AND PELVIS WITH CONTRAST TECHNIQUE: Multidetector CT imaging of the abdomen and pelvis was performed using the standard protocol following bolus administration of intravenous  contrast. CONTRAST:  143mL OMNIPAQUE IOHEXOL 300 MG/ML  SOLN COMPARISON:  None. FINDINGS: Lower chest: No acute abnormality. Hepatobiliary: No focal liver abnormality is seen. Gallbladder is contracted. No biliary dilatation. Pancreas: Unremarkable. Spleen: Unremarkable. Adrenals/Urinary Tract: Kidneys and adrenals are unremarkable. Bladder is distended. Stomach/Bowel: Stomach appears within normal limits. Bowel is normal in caliber. Normal appendix. Vascular/Lymphatic: No significant vascular findings are present. No enlarged abdominal or pelvic lymph nodes. Reproductive: A 1.4 cm enhancing lesion of right uterine  body may reflect a fibroid. No adnexal mass. Other: No abdominal wall hernia or abnormality. No abdominopelvic ascites. Musculoskeletal: No acute or significant osseous findings. IMPRESSION: No acute abnormality. Small enhancing lesion of the uterus, which may reflect a fibroid. Consider pelvic ultrasound for additional evaluation. Electronically Signed   By: Macy Mis M.D.   On: 10/11/2019 18:36   US PELVIC COMPLETE WITH TRANSVAGINAL  Result Date: 10/11/2019 CLINICAL DATA:  49 year old female with dysfunctional uterine bleeding for 2 months. Weight loss. Unrevealing CT Abdomen and Pelvis earlier today aside from possible fibroid. EXAM: TRANSABDOMINAL AND TRANSVAGINAL ULTRASOUND OF PELVIS TECHNIQUE: Both transabdominal and transvaginal ultrasound examinations of the pelvis were performed. Transabdominal technique was performed for global imaging of the pelvis including uterus, ovaries, adnexal regions, and pelvic cul-de-sac. It was necessary to proceed with endovaginal exam following the transabdominal exam to visualize the ovaries. COMPARISON:  Ob ultrasound 08/11/2008. CT Abdomen and Pelvis earlier today. FINDINGS: Uterus Measurements: 6.5 x 4.2 x 5.8 cm = volume: 82 mL. Anteverted. 3 cm diameter intramural fibroid best demonstrated on image 38. Endometrium Thickness: 3 mm.  No focal abnormality  visualized. Right ovary Measurements: 4.2 x 1.9 x 2.4 cm = volume: 10 mL. Normal appearance/no adnexal mass. Left ovary Measurements: Could not be identified despite transabdominal and transvaginal attempts. The left ovary appear normal along the left pelvic side wall by CT earlier today (series 3, image 65 of that exam). Other findings Trace simple appearing pelvic free fluid. IMPRESSION: 1. Solitary 3 cm intramural fibroid.  Otherwise normal uterus. 2. Normal right ovary. 3. Left ovary could not be identified, but seemed normal on the CT earlier today. 4. Trace pelvic free fluid, likely physiologic. Electronically Signed   By: Genevie Ann M.D.   On: 10/11/2019 21:48      Karlyn Glasco T. [place]  If 7PM-7AM, please contact night-coverage [email or web address] Password [facility] 10/12/2019, 4:28 PM

## 2019-10-12 NOTE — Progress Notes (Signed)
Inpatient Diabetes Program Recommendations  AACE/ADA: New Consensus Statement on Inpatient Glycemic Control   Target Ranges:  Prepandial:   less than 140 mg/dL      Peak postprandial:   less than 180 mg/dL (1-2 hours)      Critically ill patients:  140 - 180 mg/dL  Results for AVEA, MCGOWEN (MRN [id number]) as of 10/12/2019 15:14  Ref. Range 10/12/2019 07:24 10/12/2019 08:32 10/12/2019 09:34 10/12/2019 10:37 10/12/2019 11:50  Glucose-Capillary Latest Ref Range: 70 - 99 mg/dL 239 (H) 187 (H) 147 (H) 122 (H) 130 (H)  Results for ANDREKA, STUCKI (MRN [id number]) as of 10/12/2019 15:14  Ref. Range 10/11/2019 15:22  Glucose Latest Ref Range: 70 - 99 mg/dL [facility])   Results for CORETHA, CRESWELL (MRN [id number]) as of 10/12/2019 15:14  Ref. Range 10/12/2019 00:00  Hemoglobin A1C Latest Ref Range: 4.8 - 5.6 % 14.4 (H)   Review of Glycemic Control  Outpatient Diabetes medications: Tresiba 50 units QHS, Novolog 8 units with breakfast, 12 units with lunch, 8 units with supper, Metformin 1000 mg BID Current orders for Inpatient glycemic control: Levemir 25 units BID, Novolog 4 units TID with meals, Novolog 0-15 units TID with meals, Novolog 0-5 units QHS  Inpatient Diabetes Program Recommendations:   HbgA1C:  A1C 14.4% on 10/12/19 indicating an average glucose of 367 mg/dl over the past 2-3 months.  NOTE: Spoke with patient over the phone about diabetes and home regimen for diabetes control. Patient reports she does not currently have a PCP and she notes that due to being a single mother, working, and financial barriers she has not been able to get a PCP and has been going to urgent care for health issues.  Patient confirms that she has been rationing insulin due to cost and not having a PCP to refill them.  Patient states that she has been given copay cards for [place] and Novolog insulin which she can use since she has [employer]. Patient was not aware of copay cards before  now. Also informed patient that she could apply for patient assistance through the insulin manufacture to see if she qualifies to get insulin free (once she has a PCP to sign off on application).  Patient does not have a glucometer and Kelly with CM/TOC states that TOC is suppose to be giving patient a new glucometer and testing supplies. Informed patient that if it was the Reli-On Classic glucometer she could purchase a box of 50 test strips at [facility] for $9 (and the meter itself was $9 if she had to purchase one).  Patient states that would be much more affordable for testing supplies.  Discussed A1C results (14.4% on 10/12/19) and explained that current A1C indicates an average glucose of 367 mg/dl over the past 2-3 months. Discussed glucose and A1C goals. Discussed importance of checking CBGs and maintaining good CBG control to prevent long-term and short-term complications. Explained how hyperglycemia leads to damage within blood vessels which lead to the common complications seen with uncontrolled diabetes. Stressed to the patient the importance of improving glycemic control to prevent further complications from uncontrolled diabetes.  Encouraged patient to check glucose 4 times per day (before meals and at bedtime) and to keep a log book of glucose readings and DM medication taken which patient will need to take to doctor appointments. Explained how the doctor can use the log book to continue to make adjustments with DM medications if needed. Informed patient that per notes  in chart, TOC has made her an appointment with [facility] on 10/29/19 at 11:15 am.  Encouraged patient to keep appointment and consistently follow up.   Patient verbalized understanding of information discussed and reports no further questions at this time related to diabetes. Patient very appreciative of assistance from [facility] team. Patient to be discharged today.  Thanks, Barnie Alderman, RN, MSN, CDE Diabetes Coordinator Inpatient Diabetes  Program [phone] (Team Pager)

## 2019-10-12 NOTE — TOC Initial Note (Signed)
Transition of Care [facility]) - Initial/Assessment Note    Patient Details  Name: [name] MRN: [id number] Date of Birth: [birth year]  [facility] CM/SW Contact:    Angelita Ingles, RN Phone Number: 10/12/2019, 2:03 PM  Clinical Narrative:  CM consulted to assist patient with a PCP. Patient states that she has been going to [facility] urgent care for the past two years. She states that she was unable to find a primary care physician due to [place] and the urgent care would not allow her schedule routine appointments due to [place]. Patient states that she has been stressed related to going through divorce. Patient confirms that she has transportation to appointments and may need assistance with the medications depending on the cost, however she does have insurance. Appointment has been scheduled with the [facility] clinic on 10/29/2019@ 11:15am. Will follow for any further needs. Appointment info has been added to the AVS.                   Expected Discharge Plan: Home/Self Care Barriers to Discharge: No Barriers Identified   Patient Goals and CMS Choice        Expected Discharge Plan and Services Expected Discharge Plan: Home/Self Care   Discharge Planning Services: Follow-up appt scheduled([facility] [phone] 10/29/2019)                                          Prior Living Arrangements/Services   Lives with:: Self, Adult Children, Minor Children Patient language and need for interpreter reviewed:: Yes Do you feel safe going back to the place where you live?: Yes      Need for Family Participation in Patient Care: No (Comment) Care giver support system in place?: Yes (comment)   Criminal Activity/Legal Involvement Pertinent to Current Situation/Hospitalization: No - Comment as needed  Activities of Daily Living Home Assistive Devices/Equipment: None, Communication device (specify type) ADL Screening (condition at time of admission) Patient's  cognitive ability adequate to safely complete daily activities?: Yes Is the patient deaf or have difficulty hearing?: No Does the patient have difficulty seeing, even when wearing glasses/contacts?: No Does the patient have difficulty concentrating, remembering, or making decisions?: No Patient able to express need for assistance with ADLs?: Yes Does the patient have difficulty dressing or bathing?: No Independently performs ADLs?: Yes (appropriate for developmental age) Does the patient have difficulty walking or climbing stairs?: No Weakness of Legs: None Weakness of Arms/Hands: None  Permission Sought/Granted                  Emotional Assessment Appearance:: Appears stated age Attitude/Demeanor/Rapport: Gracious Affect (typically observed): Calm Orientation: : Oriented to Self, Oriented to Place, Oriented to Situation, Oriented to  Time   Psych Involvement: No (comment)  Admission diagnosis:  Dysfunctional uterine bleeding [N93.8] Lower GI bleed [K92.2] Weight loss [R63.4] Hyperglycemia [R73.9] Near syncope [R55] Uncontrolled type 1 diabetes mellitus with hyperglycemia, with long-term current use of insulin ([place]) [E10.65] Patient Active Problem List   Diagnosis Date Noted  . Uncontrolled type 1 diabetes mellitus with hyperglycemia, with long-term current use of insulin ([place]) 10/11/2019  . Rectal bleeding 10/11/2019  . Dysfunctional uterine bleeding 10/11/2019  . Mild renal insufficiency 10/11/2019  . Intractable nausea and vomiting   . Vomiting 03/30/2019  . Complicated grieving 93/81/8299  . Ulcers of both lower legs ([place]) 02/20/2014  .  Health maintenance examination 09/20/2013  . Diabetic ulcer of left foot ([place]) 05/12/2013  . Diabetic foot ulcers ([place]) 05/01/2013  . Skin rash 01/05/2013  . Contraception management 12/31/2012  . Chest pain 12/30/2012  . Mood disorder ([place]) 12/30/2012  . Unspecified vitamin D deficiency 12/03/2012  . H/O thyroid nodule  12/03/2012  . Type 1 diabetes mellitus with diabetic neuropathy ([place]) 06/18/2007   PCP:  Patient, No Pcp Per Pharmacy:   [id number] - [place], [place] - [address] [place] [address] Phone: [phone] Fax: [phone]     Social Determinants of Health (SDOH) Interventions    Readmission Risk Interventions No flowsheet data found.

## 2019-10-13 DIAGNOSIS — F329 Major depressive disorder, single episode, unspecified: Secondary | ICD-10-CM

## 2019-10-13 DIAGNOSIS — F419 Anxiety disorder, unspecified: Secondary | ICD-10-CM

## 2019-10-13 DIAGNOSIS — N189 Chronic kidney disease, unspecified: Secondary | ICD-10-CM

## 2019-10-13 DIAGNOSIS — R110 Nausea: Secondary | ICD-10-CM

## 2019-10-13 DIAGNOSIS — Z9114 Patient's other noncompliance with medication regimen: Secondary | ICD-10-CM

## 2019-10-13 LAB — CBC
HCT: 27.2 % — ABNORMAL LOW (ref 36.0–46.0)
Hemoglobin: 9.1 g/dL — ABNORMAL LOW (ref 12.0–15.0)
MCH: 29.3 pg (ref 26.0–34.0)
MCHC: 33.5 g/dL (ref 30.0–36.0)
MCV: 87.5 fL (ref 80.0–100.0)
Platelets: 216 10*3/uL (ref 150–400)
RBC: 3.11 MIL/uL — ABNORMAL LOW (ref 3.87–5.11)
RDW: 13.7 % (ref 11.5–15.5)
WBC: 4.7 10*3/uL (ref 4.0–10.5)
nRBC: 0 % (ref 0.0–0.2)

## 2019-10-13 LAB — GC/CHLAMYDIA PROBE AMP (~~LOC~~) NOT AT ARMC
Chlamydia: NEGATIVE
Neisseria Gonorrhea: NEGATIVE

## 2019-10-13 LAB — RENAL FUNCTION PANEL
Albumin: 1.8 g/dL — ABNORMAL LOW (ref 3.5–5.0)
Anion gap: 5 (ref 5–15)
BUN: 8 mg/dL (ref 6–20)
CO2: 23 mmol/L (ref 22–32)
Calcium: 8.2 mg/dL — ABNORMAL LOW (ref 8.9–10.3)
Chloride: 108 mmol/L (ref 98–111)
Creatinine, Ser: 0.6 mg/dL (ref 0.44–1.00)
GFR calc Af Amer: >60 mL/min (ref >60)
GFR calc non Af Amer: >60 mL/min (ref >60)
Glucose, Bld: 247 mg/dL — ABNORMAL HIGH (ref 70–99)
Phosphorus: 3.3 mg/dL (ref 2.5–4.6)
Potassium: 4.3 mmol/L (ref 3.5–5.1)
Sodium: 136 mmol/L (ref 135–145)

## 2019-10-13 LAB — GLUCOSE, CAPILLARY
Glucose-Capillary: 209 mg/dL — ABNORMAL HIGH (ref 70–99)
Glucose-Capillary: 182 mg/dL — ABNORMAL HIGH (ref 70–99)

## 2019-10-13 LAB — MAGNESIUM: Magnesium: 2 mg/dL (ref 1.7–2.4)

## 2019-10-13 MED ORDER — FLUCONAZOLE 150 MG PO TABS
150.0000 mg | ORAL_TABLET | Freq: Once | ORAL | Status: AC
  Administered 2019-10-13: 150 mg via ORAL
  Filled 2019-10-13: qty 1

## 2019-10-13 MED ORDER — ASPIRIN EC 81 MG PO TBEC: 81.0000 mg | DELAYED_RELEASE_TABLET | Freq: Every day | ORAL | 1 refills | Status: DC

## 2019-10-13 NOTE — Progress Notes (Signed)
I concur with the assessment and med administration (8:00 a.m. - 11:30 a.m.) implemented and entered by Estill Bamberg, SN.

## 2019-10-13 NOTE — TOC Transition Note (Signed)
Transition of Care [facility]) - CM/SW Discharge Note   Patient Details  Name: [name] MRN: [id number] Date of Birth: July 06, 1971  [facility]) CM/SW Contact:  Pollie Friar, RN Phone Number: 10/13/2019, 11:17 AM   Clinical Narrative:    Pt discharging home with self care. Pt set up with PCP. Discharge meds delivered to the room. Pt has transportation home.    Final next level of care: Home/Self Care Barriers to Discharge: No Barriers Identified   Patient Goals and CMS Choice        Discharge Placement                       Discharge Plan and Services   Discharge Planning Services: Follow-up appt scheduled([facility] [phone] 10/29/2019)                                 Social Determinants of Health (SDOH) Interventions     Readmission Risk Interventions No flowsheet data found.

## 2019-10-13 NOTE — Progress Notes (Signed)
PT d/c to home with home meds from fridge. IV removed. Bathed. Pt verbalized understanding of d/c instructions

## 2019-10-13 NOTE — Discharge Instructions (Signed)

## 2019-10-13 NOTE — Discharge Summary (Signed)
Physician Discharge Summary  [name] HUD:[id number] DOB: Nov 15, 1970 DOA: 10/11/2019  PCP: Patient, No Pcp Per  Admit date: 10/11/2019 Discharge date: 10/13/2019  Admitted From: Home Disposition: Home  Recommendations for Outpatient Follow-up:  1. Follow ups as below. 2. Please check CBC/BMP and blood glucose log at follow-up. 3. May adjust insulin dose as needed at follow-up. 4. Needs annual eye exam 5. Please follow up on the following pending results: None  Home Health: None Equipment/Devices: None  Discharge Condition: Stable CODE STATUS: Full code  Follow-up Information    [facility] .        [facility] Follow up on 10/29/2019.   Why: You have an appointment for Friday March 26th @ 11:15am. Please bring your card and picture ID. If you have any questions you can call the office at [phone] Contact information: [address]  [place], [place] [address]         Hospital Course: 49 year old female with history of type 1 diabetes, anxiety, depression and constipation presenting with lightheadedness, rectal bleeding and abnormal vaginal bleeding.  Has no PCP.  She has been rationing her insulin.   In ED, slightly tachycardic with stable blood pressure.  Hgb 11.9 (about baseline). Na 127.  Glucose 908.  Cr4 1.13.  Bicarb 22.  Anion gap 12.  UA with> 500 glucose, 5 ketones, small Hgb and protein.  COVID-19 negative.  I stat hCG negative.  FOBT positive.  Wet prep not impressive.  CT abdomen and TVUS without significant finding other than possible fibroids.  EKG with sinus tachycardia to 108, LVH and TWI.   Admitted for hyperglycemia, possible GI bleed and AUB.  Started on insulin drip and IV fluids.  The next morning, hyperglycemia improved.  Transitioned to subcu insulin. However, patient started having nausea and explosive diarrhea.  Remained on IV fluid.  C. difficile negative.  On the day of discharge, GI symptoms resolved.  Blood glucose was in  acceptable range.  No complaints.  She felt well and ready to go home.  Prescriptions for her medications and diabetic supplies delivered to bedside before discharge.   Discharge Diagnoses:  Uncontrolled DM-1 with hyperglycemia: A1c 14.4%.  Likely due to noncompliance.  Has been rationing her insulin.  She has no PCP. Recent Labs    10/12/19 2104 10/13/19 0639 10/13/19 0751  GLUCAP 128* 209* 182*  -Discharged on Tresiba 50 units daily and NovoLog AC 9 units -Was given a prescription for glucagon -Added atorvastatin and aspirin. -Counseled on diabetic care, diet and lifestyle by diabetic coordinator and dietitian. -Encouraged to keep glucose log -PCP follow-up as above  Nausea and diarrhea:  Likely due to uncontrolled diabetes and contrast media she received for CT. C. difficile negative.  Nausea and diarrhea have resolved on the day of discharge.  Hematochezia: Reports history of constipation.    No GI bleeding in 3 days now.  Hemoglobin slightly dropped likely dilutional from IV fluid.   Anemia panel consistent with iron deficiency or blood loss. -May benefit from outpatient GI evaluation if this recurs.  Abnormal uterine bleeding: reports heavy bleeding weekly for the last 2 months but no further vaginal bleed for 1 week now.  Her history is somewhat inconsistent.  CT abdomen and pelvis and TVUS without significant finding other than a small fibroid.  Not sure about her last Pap smear. -Recommend ambulatory referral to gynecology if this continues to be a problem -She is overdue for Pap smear.  AKI: Likely due  to hyperglycemia.  Resolved. -Continue monitoring  Depression and anxiety: Stable.  Intermittently takes his Cymbalta at home -Encouraged to take his Cymbalta consistently to avoid withdrawal symptoms and side effects.  Hypokalemia/hypomagnesemia: Replenished and resolved.  Normocytic anemia-Hgb 11.0 in 04/2019> 11.9 (admit) > 9.6 > 9.1.  Pattern consistent with  hemodilution.  She was on aggressive IV fluid resuscitation for DKA.    No GI or vaginal bleed.  Anemia panel consistent with ACD. -Recheck CBC at follow-up -P.o. ferrous sulfate with bowel regimen -Management of possible GI bleed and AUB as above.   Discharge Instructions  Discharge Instructions    Call MD for:  difficulty breathing, headache or visual disturbances   Complete by: As directed    Call MD for:  extreme fatigue   Complete by: As directed    Call MD for:  persistant dizziness or light-headedness   Complete by: As directed    Call MD for:  persistant nausea and vomiting   Complete by: As directed    Call MD for:  temperature >100.4   Complete by: As directed    Diet - low sodium heart healthy   Complete by: As directed    Diet Carb Modified   Complete by: As directed    Discharge instructions   Complete by: As directed    It has been a pleasure taking care of you! You were hospitalized with markedly elevated blood glucose due to uncontrolled diabetes.  You were treated for this.  It is very important that you take your insulins and other diabetic medications as prescribed.  We also recommend lifestyle change including appropriate diet and exercise.  Please review your new medication list and the directions before you take your medications.  Please use only 25 units of Tresiba tonight. You can resume 50 units daily starting tomorrow.  In regards to vaginal bleeding, we sent a referral to gynecology for evaluation.  We have also started you on iron pills.   In regards to rectal bleeding, please use appropriate stool softeners to avoid constipation.  Your primary care doctor can refer you to gastroenterologist outpatient.   Take care,   Increase activity slowly   Complete by: As directed      Allergies as of 10/13/2019      Reactions   Amoxicillin Itching   Gabapentin Other (See Comments)   Dizziness   Lantus [insulin Glargine] Itching, Rash      Medication  List    STOP taking these medications   glucose blood test strip Commonly known as: ACCU-CHEK ACTIVE STRIPS   metFORMIN 1000 MG tablet Commonly known as: GLUCOPHAGE   OneTouch Verio test strip Generic drug: glucose blood   OneTouch Verio w/Device Kit     TAKE these medications   accu-chek soft touch lancets Check 3-4 times daily for uncontrolled insulin dependent type 1 diabetes What changed: Another medication with the same name was removed. Continue taking this medication, and follow the directions you see here.   aspirin EC 81 MG tablet Take 1 tablet (81 mg total) by mouth daily.   atorvastatin 20 MG tablet Commonly known as: Lipitor Take 1 tablet (20 mg total) by mouth daily.   blood glucose meter kit and supplies Kit Dispense based on patient and insurance preference. Use up to four times daily as directed. (FOR ICD-10 E10.65). What changed: additional instructions   DULCOLAX PO Take 2 tablets by mouth daily. Chewable   DULoxetine 60 MG capsule Commonly known as: CYMBALTA Take 60  mg by mouth daily.   ferrous sulfate 325 (65 FE) MG tablet Take 1 tablet (325 mg total) by mouth daily.   glucagon 1 MG injection Follow package directions for low blood sugar. What changed:   how much to take  how to take this  when to take this  reasons to take this  additional instructions   hydrOXYzine 50 MG capsule Commonly known as: VISTARIL Take 50 mg by mouth every 6 (six) hours as needed for anxiety.   lactulose 10 GM/15ML solution Commonly known as: CHRONULAC Take 15 mLs by mouth daily as needed for constipation.   METAMUCIL PO Take 1 packet by mouth daily. Mix with water   NovoLOG FlexPen 100 UNIT/ML FlexPen Generic drug: insulin aspart Inject 9 Units into the skin 3 (three) times daily with meals. What changed:   how much to take  how to take this  when to take this  additional instructions   Pen Needles 31G X 5 MM Misc 1 each by Does not apply  route 6 (six) times daily.   PROBIOTIC PO Take 1 tablet by mouth daily. gummies   RA One Daily Gummy Vites Chew Chew 1 tablet by mouth daily.   senna-docusate 8.6-50 MG tablet Commonly known as: Senokot-S Take 1 tablet by mouth 2 (two) times daily between meals as needed for mild constipation.   [name] 100 UNIT/ML FlexTouch Pen Generic drug: insulin degludec Inject 0.5 mLs (50 Units total) into the skin at bedtime.   Vitamin D (Ergocalciferol) 1.25 MG (50000 UNIT) Caps capsule Commonly known as: DRISDOL Take 50,000 Units by mouth every 7 (seven) days.       Consultations:  Diabetic coordinator  Dietitian  Procedures/Studies:   CT ABDOMEN PELVIS W CONTRAST  Result Date: 10/11/2019 CLINICAL DATA:  GI and vaginal bleeding, weight loss EXAM: CT ABDOMEN AND PELVIS WITH CONTRAST TECHNIQUE: Multidetector CT imaging of the abdomen and pelvis was performed using the standard protocol following bolus administration of intravenous contrast. CONTRAST:  188m OMNIPAQUE IOHEXOL 300 MG/ML  SOLN COMPARISON:  None. FINDINGS: Lower chest: No acute abnormality. Hepatobiliary: No focal liver abnormality is seen. Gallbladder is contracted. No biliary dilatation. Pancreas: Unremarkable. Spleen: Unremarkable. Adrenals/Urinary Tract: Kidneys and adrenals are unremarkable. Bladder is distended. Stomach/Bowel: Stomach appears within normal limits. Bowel is normal in caliber. Normal appendix. Vascular/Lymphatic: No significant vascular findings are present. No enlarged abdominal or pelvic lymph nodes. Reproductive: A 1.4 cm enhancing lesion of right uterine body may reflect a fibroid. No adnexal mass. Other: No abdominal wall hernia or abnormality. No abdominopelvic ascites. Musculoskeletal: No acute or significant osseous findings. IMPRESSION: No acute abnormality. Small enhancing lesion of the uterus, which may reflect a fibroid. Consider pelvic ultrasound for additional evaluation. Electronically  Signed   By: PMacy MisM.D.   On: 10/11/2019 18:36   [place]PELV[place] COMPLETE WITH TRANSVAGINAL  Result Date: 10/11/2019 CLINICAL DATA:  49year old female with dysfunctional uterine bleeding for 2 months. Weight loss. Unrevealing CT Abdomen and Pelvis earlier today aside from possible fibroid. EXAM: TRANSABDOMINAL AND TRANSVAGINAL ULTRASOUND OF PELVIS TECHNIQUE: Both transabdominal and transvaginal ultrasound examinations of the pelvis were performed. Transabdominal technique was performed for global imaging of the pelvis including uterus, ovaries, adnexal regions, and pelvic cul-de-sac. It was necessary to proceed with endovaginal exam following the transabdominal exam to visualize the ovaries. COMPARISON:  Ob ultrasound 08/11/2008. CT Abdomen and Pelvis earlier today. FINDINGS: Uterus Measurements: 6.5 x 4.2 x 5.8 cm = volume: 82 mL. Anteverted. 3 cm  diameter intramural fibroid best demonstrated on image 38. Endometrium Thickness: 3 mm.  No focal abnormality visualized. Right ovary Measurements: 4.2 x 1.9 x 2.4 cm = volume: 10 mL. Normal appearance/no adnexal mass. Left ovary Measurements: Could not be identified despite transabdominal and transvaginal attempts. The left ovary appear normal along the left pelvic side wall by CT earlier today (series 3, image 65 of that exam). Other findings Trace simple appearing pelvic free fluid. IMPRESSION: 1. Solitary 3 cm intramural fibroid.  Otherwise normal uterus. 2. Normal right ovary. 3. Left ovary could not be identified, but seemed normal on the CT earlier today. 4. Trace pelvic free fluid, likely physiologic. Electronically Signed   By: Genevie Ann M.D.   On: 10/11/2019 21:48       Discharge Exam: Vitals:   10/13/19 0439 10/13/19 0804  BP: 117/79 109/80  Pulse: 91 95  Resp: 17 16  Temp: 98.1 F (36.7 C) 98.2 F (36.8 C)  SpO2: 99% 100%    GENERAL: No acute distress.  Appears well.  HEENT: MMM.  Vision and hearing grossly intact.  NECK: Supple.  No  apparent JVD.  RESP:  No IWOB. Good air movement bilaterally. CVS:  RRR. Heart sounds normal.  ABD/GI/GU: Bowel sounds present. Soft. Non tender.  MSK/EXT:  Moves extremities. No apparent deformity or edema.  SKIN: Old scars and discoloration over RLE. NEURO: Awake, alert and oriented appropriately.  No apparent focal neuro deficit. PSYCH: Calm. Normal affect.   The results of significant diagnostics from this hospitalization (including imaging, microbiology, ancillary and laboratory) are listed below for reference.     Microbiology: Recent Results (from the past 240 hour(s))  Wet prep, genital     Status: Abnormal   Collection Time: 10/11/19  5:45 PM   Specimen: Cervix  Result Value Ref Range Status   Yeast Wet Prep HPF POC NONE SEEN NONE SEEN Final   Trich, Wet Prep NONE SEEN NONE SEEN Final   Clue Cells Wet Prep HPF POC NONE SEEN NONE SEEN Final   WBC, Wet Prep HPF POC MANY (A) NONE SEEN Final   Sperm NONE SEEN  Final    Comment: Performed at [facility], [place]. [address], [place], [place] [address]  SARS CORONAVIRUS 2 (TAT 6-24 HRS) Nasopharyngeal Nasopharyngeal Swab     Status: None   Collection Time: 10/11/19  7:18 PM   Specimen: Nasopharyngeal Swab  Result Value Ref Range Status   SARS Coronavirus 2 NEGATIVE NEGATIVE Final    Comment: (NOTE) SARS-CoV-2 target nucleic acids are NOT DETECTED. The SARS-CoV-2 RNA is generally detectable in upper and lower respiratory specimens during the acute phase of infection. Negative results do not preclude SARS-CoV-2 infection, do not rule out co-infections with other pathogens, and should not be used as the sole basis for treatment or other patient management decisions. Negative results must be combined with clinical observations, patient history, and epidemiological information. The expected result is Negative. Fact Sheet for Patients: SugarRoll.be Fact Sheet for Healthcare  Providers: https://www.woods-mathews.com/ This test is not yet approved or cleared by the [place] FDA and  has been authorized for detection and/or diagnosis of SARS-CoV-2 by FDA under an Emergency Use Authorization (EUA). This EUA will remain  in effect (meaning this test can be used) for the duration of the COVID-19 declaration under Section 56 4(b)(1) of the Act, 21 U.S.C. section 360bbb-3(b)(1), unless the authorization is terminated or revoked sooner. Performed at [facility], [place],  [place] [address]   C difficile quick scan w PCR reflex     Status: None   Collection Time: 10/12/19  6:15 PM   Specimen: STOOL  Result Value Ref Range Status   C Diff antigen NEGATIVE NEGATIVE Final   C Diff toxin NEGATIVE NEGATIVE Final   C Diff interpretation No C. difficile detected.  Final    Comment: Performed at [facility], [place] [address] [place], [place] [address]     Labs: BNP (last 3 results) No results for input(s): BNP in the last 8760 hours. Basic Metabolic Panel: Recent Labs  Lab 10/11/19 1522 10/12/19 0000 10/12/19 0834 10/13/19 0449  NA 127* 138 138 136  K 4.8 3.4* 3.1* 4.3  CL 93* 104 104 108  CO2 '22 25 25 23  ' GLUCOSE 908* 251* 201* 247*  BUN '14 8 6 8  ' CREATININE 1.13* 0.56 0.54 0.60  CALCIUM 9.5 8.8* 8.5* 8.2*  MG  --   --  1.5* 2.0  PHOS  --   --   --  3.3   Liver Function Tests: Recent Labs  Lab 10/11/19 1522 10/13/19 0449  AST 11*  --   ALT 15  --   ALKPHOS 95  --   BILITOT 0.9  --   PROT 7.4  --   ALBUMIN 2.8* 1.8*   No results for input(s): LIPASE, AMYLASE in the last 168 hours. No results for input(s): AMMONIA in the last 168 hours. CBC: Recent Labs  Lab 10/11/19 1522 10/12/19 0000 10/12/19 1705 10/13/19 0449  WBC 6.4 5.6 5.3 4.7  NEUTROABS  --  3.1  --   --   HGB 11.9* 9.6* 9.9* 9.1*  HCT 36.8 28.6* 29.2* 27.2*  MCV 90.9 86.9 86.6 87.5  PLT 282 243 235 216   Cardiac Enzymes: No results  for input(s): CKTOTAL, CKMB, CKMBINDEX, TROPONINI in the last 168 hours. BNP: Invalid input(s): POCBNP CBG: Recent Labs  Lab 10/12/19 1150 10/12/19 1622 10/12/19 2104 10/13/19 0639 10/13/19 0751  GLUCAP 130* 134* 128* 209* 182*   D-Dimer No results for input(s): DDIMER in the last 72 hours. Hgb A1c Recent Labs    10/12/19 0000  HGBA1C 14.4*   Lipid Profile Recent Labs    10/12/19 0834  CHOL 228*  HDL 44  LDLCALC 168*  TRIG 81  CHOLHDL 5.2   Thyroid function studies Recent Labs    10/12/19 0000  TSH 1.016   Anemia work up Recent Labs    10/12/19 0957  VITAMINB12 803  FOLATE 10.3  FERRITIN 11  TIBC 238*  IRON 62  RETICCTPCT 2.0   Urinalysis    Component Value Date/Time   COLORURINE STRAW (A) 10/11/2019 1745   APPEARANCEUR CLEAR 10/11/2019 1745   LABSPEC 1.023 10/11/2019 1745   PHURINE 5.0 10/11/2019 1745   GLUCOSEU >=500 (A) 10/11/2019 1745   HGBUR SMALL (A) 10/11/2019 1745   BILIRUBINUR NEGATIVE 10/11/2019 1745   BILIRUBINUR negative 04/23/2019 1658   KETONESUR 5 (A) 10/11/2019 1745   PROTEINUR 100 (A) 10/11/2019 1745   UROBILINOGEN 0.2 04/23/2019 1658   UROBILINOGEN 1.0 08/11/2008 1745   NITRITE NEGATIVE 10/11/2019 1745   LEUKOCYTESUR NEGATIVE 10/11/2019 1745   Sepsis Labs Invalid input(s): PROCALCITONIN,  WBC,  LACTICIDVEN   Time coordinating discharge: 35 minutes  SIGNED:  Mercy Riding, MD  [facility] 10/13/2019, 3:01 PM  If 7PM-7AM, please contact night-coverage [email or web address] Password [email or web address]

## 2019-10-14 ENCOUNTER — Ambulatory Visit: Payer: BC Managed Care – PPO | Attending: Internal Medicine

## 2019-10-14 DIAGNOSIS — Z23 Encounter for immunization: Secondary | ICD-10-CM

## 2019-10-14 NOTE — Progress Notes (Signed)
   Covid-19 Vaccination Clinic  Name:  Dynesha Woolen    MRN: [id number] DOB: Oct 24, 1970  10/14/2019  Ms. Hymas-Phillips was observed post Covid-19 immunization for 15 minutes without incident. She was provided with Vaccine Information Sheet and instruction to access the V-Safe system.   Ms. [name] was instructed to call 911 with any severe reactions post vaccine: Marland Kitchen Difficulty breathing  . Swelling of face and throat  . A fast heartbeat  . A bad rash all over body  . Dizziness and weakness   Immunizations Administered    Name Date Dose VIS Date Route   Pfizer COVID-19 Vaccine 10/14/2019  9:29 AM 0.3 mL 07/16/2019 Intramuscular   Manufacturer: [place]   Lot: [id number]   [place]: [id number]

## 2019-10-27 DIAGNOSIS — R600 Localized edema: Secondary | ICD-10-CM | POA: Diagnosis not present

## 2019-10-27 DIAGNOSIS — E785 Hyperlipidemia, unspecified: Secondary | ICD-10-CM | POA: Diagnosis not present

## 2019-10-27 DIAGNOSIS — N390 Urinary tract infection, site not specified: Secondary | ICD-10-CM | POA: Diagnosis not present

## 2019-10-27 DIAGNOSIS — E108 Type 1 diabetes mellitus with unspecified complications: Secondary | ICD-10-CM | POA: Diagnosis not present

## 2019-10-27 DIAGNOSIS — D259 Leiomyoma of uterus, unspecified: Secondary | ICD-10-CM | POA: Diagnosis not present

## 2019-11-02 DIAGNOSIS — Z1159 Encounter for screening for other viral diseases: Secondary | ICD-10-CM | POA: Diagnosis not present

## 2019-11-02 DIAGNOSIS — D649 Anemia, unspecified: Secondary | ICD-10-CM | POA: Diagnosis not present

## 2019-11-02 DIAGNOSIS — N390 Urinary tract infection, site not specified: Secondary | ICD-10-CM | POA: Diagnosis not present

## 2019-11-02 DIAGNOSIS — R7401 Elevation of levels of liver transaminase levels: Secondary | ICD-10-CM | POA: Diagnosis not present

## 2019-11-02 DIAGNOSIS — R600 Localized edema: Secondary | ICD-10-CM | POA: Diagnosis not present

## 2019-11-02 DIAGNOSIS — R110 Nausea: Secondary | ICD-10-CM | POA: Diagnosis not present

## 2019-11-02 DIAGNOSIS — D259 Leiomyoma of uterus, unspecified: Secondary | ICD-10-CM | POA: Diagnosis not present

## 2019-11-02 DIAGNOSIS — E108 Type 1 diabetes mellitus with unspecified complications: Secondary | ICD-10-CM | POA: Diagnosis not present

## 2019-11-03 ENCOUNTER — Other Ambulatory Visit: Payer: Self-pay

## 2019-11-03 ENCOUNTER — Ambulatory Visit: Payer: BC Managed Care – PPO | Attending: Internal Medicine

## 2019-11-03 DIAGNOSIS — Z23 Encounter for immunization: Secondary | ICD-10-CM

## 2019-11-03 NOTE — Progress Notes (Signed)
   Covid-19 Vaccination Clinic  Name:  Elvina Bosch    MRN: [id number] DOB: 03-30-1971  11/03/2019  Ms. Hendley-Phillips was observed post Covid-19 immunization for 15 minutes without incident. She was provided with Vaccine Information Sheet and instruction to access the V-Safe system.   Ms. [name] was instructed to call 911 with any severe reactions post vaccine: Marland Kitchen Difficulty breathing  . Swelling of face and throat  . A fast heartbeat  . A bad rash all over body  . Dizziness and weakness   Immunizations Administered    Name Date Dose VIS Date Route   Pfizer COVID-19 Vaccine 11/03/2019  9:53 AM 0.3 mL 07/16/2019 Intramuscular   Manufacturer: [place]   Lot: [id number]   [place]: [id number]

## 2019-11-08 ENCOUNTER — Encounter: Payer: Self-pay | Admitting: Gastroenterology

## 2019-11-08 DIAGNOSIS — E119 Type 2 diabetes mellitus without complications: Secondary | ICD-10-CM | POA: Diagnosis not present

## 2019-11-08 DIAGNOSIS — R110 Nausea: Secondary | ICD-10-CM | POA: Diagnosis not present

## 2019-11-08 DIAGNOSIS — E1140 Type 2 diabetes mellitus with diabetic neuropathy, unspecified: Secondary | ICD-10-CM | POA: Diagnosis not present

## 2019-11-08 DIAGNOSIS — E11622 Type 2 diabetes mellitus with other skin ulcer: Secondary | ICD-10-CM | POA: Diagnosis not present

## 2019-11-16 DIAGNOSIS — Z124 Encounter for screening for malignant neoplasm of cervix: Secondary | ICD-10-CM | POA: Diagnosis not present

## 2019-11-16 DIAGNOSIS — Z113 Encounter for screening for infections with a predominantly sexual mode of transmission: Secondary | ICD-10-CM | POA: Diagnosis not present

## 2019-11-16 DIAGNOSIS — Z01419 Encounter for gynecological examination (general) (routine) without abnormal findings: Secondary | ICD-10-CM | POA: Diagnosis not present

## 2019-11-16 DIAGNOSIS — Z6821 Body mass index (BMI) 21.0-21.9, adult: Secondary | ICD-10-CM | POA: Diagnosis not present

## 2019-11-19 ENCOUNTER — Encounter: Payer: Self-pay | Admitting: Gastroenterology

## 2019-11-19 ENCOUNTER — Ambulatory Visit: Payer: BC Managed Care – PPO | Admitting: Gastroenterology

## 2019-11-19 VITALS — BP 126/80 | HR 108 | Temp 97.8°F | Ht 72.0 in | Wt 165.0 lb

## 2019-11-19 DIAGNOSIS — R1013 Epigastric pain: Secondary | ICD-10-CM | POA: Diagnosis not present

## 2019-11-19 DIAGNOSIS — K625 Hemorrhage of anus and rectum: Secondary | ICD-10-CM

## 2019-11-19 DIAGNOSIS — R112 Nausea with vomiting, unspecified: Secondary | ICD-10-CM

## 2019-11-19 DIAGNOSIS — K5900 Constipation, unspecified: Secondary | ICD-10-CM | POA: Diagnosis not present

## 2019-11-19 DIAGNOSIS — R634 Abnormal weight loss: Secondary | ICD-10-CM

## 2019-11-19 MED ORDER — NA SULFATE-K SULFATE-MG SULF 17.5-3.13-1.6 GM/177ML PO SOLN: 1.0000 | Freq: Once | ORAL | 0 refills | Status: AC

## 2019-11-19 NOTE — Progress Notes (Signed)
11/19/2019 [name] [id number] February 05, 1971   HISTORY OF PRESENT ILLNESS: This is a 49 year old female who is new to our office.  She has been referred here by Shanon Rosser, PA-C, for evaluation regarding her multitude of GI complaints.  She has poorly controlled type I diabetic with a hemoglobin A1c of 14.4 most recently and hospitalized with a blood sugar of 908 in early March.  She presents here today with complaints of intermittent dysphagia over the past year.  Says that it feels like her throat is closing up.  She says that this can occur with anything, both solids and liquids.  Complains of generalized abdominal bloating.  Reports acid reflux for over the past year.  Is on pantoprazole 40 mg daily.  She reports intermittent episodes of vomiting for the past 6 to 8 months, but says that she does vomit every day..  She reports 30 to 60 pound weight loss, but says that a lot of that has been fluid.  Says she has been retaining fluid and they suspect she might have heart failure.  She is actually seeing cardiology in a couple of days.  She also reports constipation for over the past year or so.  She tried Metamucil in the past, but does that does not seem to help.  She had one episode of bright red blood in her stool during her recent hospitalization, but has not had any recurrence of that.  She reports "my tummy hurts".  Reports upper abdominal pain.  Says that she has been having a lot of problems over the past year.  She says that she hates to eat or drink anything.  She had a gastric emptying scan in August 2020 that was normal.  She also had a CT scan of the abdomen and pelvis with contrast in August 2020 and another CT scan of the abdomen and pelvis with contrast in March 2020 that have been unremarkable for any cause of her symptoms.  Has never had EGD or colonoscopy in the past.  She does have a normocytic anemia with most recent hemoglobin in the 9 g range.  MCV normal at 87.5.   Iron studies were normal.  B12 and folate levels normal as well.   Past Medical History:  Diagnosis Date  . Arthritis   . History of chicken pox   . Migraines   . Mood disorder (HCC)    anxiety  . Prurigo nodularis    with diabetic dermopathy  . Type 1 diabetes, uncontrolled, with neuropathy ([place])    [place]   Past Surgical History:  Procedure Laterality Date  . treadmill stress test  01/2013   WNL, low risk study    reports that she has never smoked. She has never used smokeless tobacco. She reports current alcohol use. She reports that she does not use drugs. family history includes Bipolar disorder in her mother; CAD in her maternal aunt and sister; Calcium disorder in her mother; Cancer in her maternal grandmother, maternal uncle, and paternal grandmother; Cancer (age of onset: 5) in her maternal aunt; Diabetes in her mother, paternal grandfather, and paternal grandmother; Heart disease in her mother; Hypertension in her mother; Stroke in her maternal aunt; Thyroid disease in her mother. Allergies  Allergen Reactions  . Amoxicillin Itching  . Lantus [Insulin Glargine] Itching and Rash      Outpatient Encounter Medications as of 11/19/2019  Medication Sig  . blood glucose meter kit and supplies KIT Dispense based on patient and  insurance preference. Use up to four times daily as directed. (FOR ICD-10 E10.65).  Marland Kitchen gabapentin (NEURONTIN) 300 MG capsule Take 300 mg by mouth at bedtime.  Marland Kitchen glucagon 1 MG injection Follow package directions for low blood sugar.  . hydrOXYzine (VISTARIL) 50 MG capsule Take 50 mg by mouth every 6 (six) hours as needed for anxiety.   . insulin aspart (NOVOLOG FLEXPEN) 100 UNIT/ML FlexPen Inject 9 Units into the skin 3 (three) times daily with meals. (Patient taking differently: Sliding scale)  . insulin degludec (TRESIBA FLEXTOUCH) 100 UNIT/ML FlexTouch Pen Inject 0.5 mLs (50 Units total) into the skin at bedtime. (Patient taking differently: Inject 25  Units into the skin 2 (two) times daily. )  . Insulin Pen Needle (PEN NEEDLES) 31G X 5 MM MISC 1 each by Does not apply route 6 (six) times daily.  . Lancets (ACCU-CHEK SOFT TOUCH) lancets Check 3-4 times daily for uncontrolled insulin dependent type 1 diabetes  . Magnesium Hydroxide (DULCOLAX PO) Take 2 tablets by mouth daily. Chewable  . pantoprazole (PROTONIX) 40 MG tablet Take 40 mg by mouth daily.  . promethazine (PHENERGAN) 25 MG tablet Take 25 mg by mouth every 4 (four) hours as needed for nausea or vomiting.  . Psyllium (METAMUCIL PO) Take 1 packet by mouth daily. Mix with water  . senna-docusate (SENOKOT-S) 8.6-50 MG tablet Take 1 tablet by mouth 2 (two) times daily between meals as needed for mild constipation.  . [DISCONTINUED] aspirin EC 81 MG tablet Take 1 tablet (81 mg total) by mouth daily.  . [DISCONTINUED] atorvastatin (LIPITOR) 20 MG tablet Take 1 tablet (20 mg total) by mouth daily.  . [DISCONTINUED] DULoxetine (CYMBALTA) 60 MG capsule Take 60 mg by mouth daily.  . [DISCONTINUED] ferrous sulfate 325 (65 FE) MG tablet Take 1 tablet (325 mg total) by mouth daily.  . [DISCONTINUED] lactulose (CHRONULAC) 10 GM/15ML solution Take 15 mLs by mouth daily as needed for constipation.  . [DISCONTINUED] Multiple Vitamins-Minerals (RA ONE DAILY GUMMY VITES) CHEW Chew 1 tablet by mouth daily.  . [DISCONTINUED] Probiotic Product (PROBIOTIC PO) Take 1 tablet by mouth daily. gummies  . [DISCONTINUED] Vitamin D, Ergocalciferol, (DRISDOL) 1.25 MG (50000 UT) CAPS capsule Take 50,000 Units by mouth every 7 (seven) days.   No facility-administered encounter medications on file as of 11/19/2019.    REVIEW OF SYSTEMS  : All other systems reviewed and negative except where noted in the History of Present Illness.  PHYSICAL EXAM: BP 126/80   Pulse (!) 108   Temp 97.8 F (36.6 C)   Ht 6' (1.829 m)   Wt 165 lb (74.8 kg)   BMI 22.38 kg/m  General: Well developed AA female in no acute  distress Head: Normocephalic and atraumatic Eyes:  Sclerae anicteric, conjunctiva pink. Ears: Normal auditory acuity Lungs: Clear throughout to auscultation; no increased WOB. Heart: Regular rate and rhythm; no M/R/G Abdomen: Soft, non-distended.  BS present.  Mild epigastric TTP. Rectal:  Will be done at the time of colonoscopy. Musculoskeletal: Symmetrical with no gross deformities  Skin: No lesions on visible extremities Extremities: No edema.  Skin changes and some sores noted on B/L LE's. Neurological: Alert oriented x 4, grossly non-focal Psychological:  Alert and cooperative. Normal mood and affect  ASSESSMENT AND PLAN: *49 year old female with multiple GI complaints including episodes of dysphagia to both solids and liquids, abdominal bloating, episodes of vomiting, reflux, constipation, rectal bleeding, and weight loss.  She reports a lot of the weight loss was from  fluid retention.  We are going to plan for both EGD and colonoscopy with Dr. Loletha Carrow to be scheduled about a month or so from now.  She is seeing cardiology in a couple of days and I suspect will likely undergo evaluation from their standpoint as well (complaints of chest pain and edema/swelling).  I am going to be in communication with the cardiologist so that they can inform us on their opinion according to her work-up and results as to whether they think we can proceed with her endoscopic evaluation.  I am going to have her increase her pantoprazole to 40 mg twice daily and I have sent a prescription to her pharmacy.  We will begin MiraLAX twice daily as well.  We will give 2-day bowel prep for colonoscopy due to her underlying constipation issues.  I suspect that some of her upper GI issues could be related to her poorly controlled blood sugars, although gastric emptying scan in August 2020 was normal. *Poorly controlled insulin-dependent diabetes mellitus: Last hemoglobin A1c 14.4.  Had blood sugar of 908 on October 11, 2019. *Hypoalbuminemia: Albumin in March was 1.8.  Has recently improved to 3.0, but obviously still low. *Normocytic anemia with normal iron studies, normal B12, normal folate: Likely anemia of chronic disease.   CC:  [place], [place], PA-C

## 2019-11-19 NOTE — Patient Instructions (Addendum)
If you are age 50 or older, your body mass index should be between 23-30. Your Body mass index is 22.38 kg/m. If this is out of the aforementioned range listed, please consider follow up with your Primary Care Provider.  If you are age 74 or younger, your body mass index should be between 19-25. Your Body mass index is 22.38 kg/m. If this is out of the aformentioned range listed, please consider follow up with your Primary Care Provider.   We have sent the following medications to your pharmacy for you to pick up at your convenience: Pantoprazole 40 mg twice daily.  Start Miralax twice daily in 8 ounces of liquid.   You have been scheduled for a colonoscopy. Please follow written instructions given to you at your visit today.  Please pick up your prep supplies at the pharmacy within the next 1-3 days. If you use inhalers (even only as needed), please bring them with you on the day of your procedure.

## 2019-11-21 DIAGNOSIS — R600 Localized edema: Secondary | ICD-10-CM | POA: Insufficient documentation

## 2019-11-21 NOTE — Progress Notes (Signed)
REASON FOR CONSULT: Leg swelling (per referral) CHF (per patient)   Chief Complaint  Patient presents with  . Leg Swelling  . New Patient (Initial Visit)    REQUESTING PHYSICIAN:  Shanon Rosser, PA-C [address],  [place] [id number]  Primary Cardiologist: Rex Kras, DO (established care 11/2019)   HPI  [name] is a 49 y.o. female who presents to the office with a chief complaint of " leg swelling and congestive heart failure." Patient's past medical history and cardiac risk factors include: Insulin-dependent diabetes mellitus, history of diabetic ulcers, hypertension, hyperlipidemia.  Patient presents to the office with a chief complaint of lower extremity swelling congestive heart failure management.  Patient is referred to the office at the request of her primary care provider at [facility] urgent care to establish care and for evaluation.  Patient states that there was a concern for her having underlying congestive heart failure but has not been formally diagnosed.  Dyspnea on exertion: Patient stated that she is having shortness of breath since February 2021.  The shortness of breath can happen while she is sitting or with exertional activities.  She noted lower extremity swelling in January 2021 which was getting progressively worse all the way up to the level of her vaginal cavity and lower abdomen.  Patient states that she was put on Lasix and diuresed more than 30 to 40 pounds and as a result Lasix was discontinued and currently is no longer on it.  She complains of orthopnea that is getting progressively worse she uses about 10 pillows, positive for PND and lower extremity swelling.  She states that even though she has these symptoms that are significant better than her recent past.  Review of systems also positive for chest pain.  Patient states that the pain is located substernally, sharp/dull in character, 5 out of 10 in intensity, nonradiating,  last for about 5 minutes, happens when she is laying down at night to sleep, last episode couple nights ago, symptoms are self limited, nonexertional, patient states that she has underlying gastroparesis and increased GERD and does not know if this is also secondary to it.  She states that her gabapentin was recently increased and her symptoms have improved.  Patient's mother passed away at the age of 29 secondary congestive heart failure.  Denies prior history of coronary artery disease, myocardial infarction, congestive heart failure, deep venous thrombosis, pulmonary embolism, stroke, transient ischemic attack.  ALLERGIES: Allergies  Allergen Reactions  . Amoxicillin Itching  . Lantus [Insulin Glargine] Itching and Rash     MEDICATION LIST PRIOR TO VISIT: Current Outpatient Medications on File Prior to Visit  Medication Sig Dispense Refill  . albuterol (PROAIR HFA) 108 (90 Base) MCG/ACT inhaler ProAir HFA 90 mcg/actuation aerosol inhaler  Inhale 2 puffs every 4 hours by inhalation route as needed.    . blood glucose meter kit and supplies KIT Dispense based on patient and insurance preference. Use up to four times daily as directed. (FOR ICD-10 E10.65). 1 each 0  . fluticasone (FLONASE) 50 MCG/ACT nasal spray Flonase 50 mcg/actuation nasal spray,suspension  SPRAY 1 SPRAY INTO EACH NOSTRIL AT BEDTIME    . gabapentin (NEURONTIN) 300 MG capsule Take 300 mg by mouth at bedtime.    Marland Kitchen glucagon 1 MG injection Follow package directions for low blood sugar. 1 each 1  . hydrocortisone 2.5 % cream hydrocortisone 2.5 % topical cream  APPLY A THIN LAYER TO THE AFFECTED AREA(S) BY TOPICAL ROUTE  2 TIMES PER DAY FOR 3 DAYS MAX    . hydrOXYzine (VISTARIL) 50 MG capsule Take 50 mg by mouth every 6 (six) hours as needed for anxiety.     Marland Kitchen ibuprofen (ADVIL) 800 MG tablet daily as needed.    . insulin aspart (NOVOLOG FLEXPEN) 100 UNIT/ML FlexPen Inject 9 Units into the skin 3 (three) times daily with  meals. (Patient taking differently: Sliding scale) 5 pen 1  . insulin degludec (TRESIBA FLEXTOUCH) 100 UNIT/ML FlexTouch Pen Inject 0.5 mLs (50 Units total) into the skin at bedtime. (Patient taking differently: Inject 25 Units into the skin 2 (two) times daily. ) 10 pen 1  . Insulin Pen Needle (PEN NEEDLES) 31G X 5 MM MISC 1 each by Does not apply route 6 (six) times daily. 200 each 11  . ketoconazole (NIZORAL) 2 % shampoo daily as needed.    . Lancets (ACCU-CHEK SOFT TOUCH) lancets Check 3-4 times daily for uncontrolled insulin dependent type 1 diabetes 100 each 12  . Magnesium Hydroxide (DULCOLAX PO) Take 2 tablets by mouth daily. Chewable    . pantoprazole (PROTONIX) 40 MG tablet Take 40 mg by mouth daily.    . promethazine (PHENERGAN) 25 MG tablet Take 25 mg by mouth every 4 (four) hours as needed for nausea or vomiting.    . Psyllium (METAMUCIL PO) Take 1 packet by mouth daily. Mix with water    . senna-docusate (SENOKOT-S) 8.6-50 MG tablet Take 1 tablet by mouth 2 (two) times daily between meals as needed for mild constipation. 180 tablet 1   No current facility-administered medications on file prior to visit.    PAST MEDICAL HISTORY: Past Medical History:  Diagnosis Date  . Arthritis   . History of chicken pox   . Migraines   . Mood disorder (HCC)    anxiety  . Prurigo nodularis    with diabetic dermopathy  . Type 1 diabetes, uncontrolled, with neuropathy ([place])    [place]    PAST SURGICAL HISTORY: Past Surgical History:  Procedure Laterality Date  . treadmill stress test  01/2013   WNL, low risk study    FAMILY HISTORY: The patient family history includes Bipolar disorder in her mother; CAD in her maternal aunt; Calcium disorder in her mother; Cancer in her maternal grandmother, maternal uncle, and paternal grandmother; Cancer (age of onset: 31) in her maternal aunt; Diabetes in her mother, paternal grandfather, paternal grandmother, and sister; Heart disease in her mother;  Heart failure in her sister; Hypertension in her mother; Stroke in her maternal aunt; Thyroid disease in her mother.   SOCIAL HISTORY:  The patient  reports that she has never smoked. She has never used smokeless tobacco. She reports current alcohol use. She reports that she does not use drugs.  REVIEW OF SYSTEMS: Review of Systems  Constitution: Negative for chills and fever.  HENT: Negative for ear discharge, ear pain and nosebleeds.   Eyes: Negative for blurred vision and discharge.  Cardiovascular: Positive for chest pain, dyspnea on exertion, leg swelling, orthopnea and paroxysmal nocturnal dyspnea. Negative for claudication, near-syncope, palpitations and syncope.  Respiratory: Positive for shortness of breath. Negative for cough.   Endocrine: Negative for polydipsia, polyphagia and polyuria.  Hematologic/Lymphatic: Negative for bleeding problem.  Skin: Negative for flushing and nail changes.  Musculoskeletal: Negative for muscle cramps, muscle weakness and myalgias.  Gastrointestinal: Positive for heartburn. Negative for abdominal pain, dysphagia, hematemesis, hematochezia, melena, nausea and vomiting.  Neurological: Positive for paresthesias. Negative for dizziness, focal  weakness and light-headedness.   PHYSICAL EXAM: Vitals with BMI 11/22/2019 11/19/2019 10/13/2019  Height _0  _1  -  Weight 167 lbs 13 oz 165 lbs -  BMI 09.60 45.40 -  Systolic 981 191 478  Diastolic 98 80 80  Pulse 295 108 95    CONSTITUTIONAL: Well-developed and well-nourished. No acute distress.  SKIN: Skin is warm and dry. No rash noted. No cyanosis. No pallor. No jaundice HEAD: Normocephalic and atraumatic.  EYES: No scleral icterus MOUTH/THROAT: Moist oral membranes.  NECK: No JVD present. No thyromegaly noted. No carotid bruits  LYMPHATIC: No visible cervical adenopathy.  CHEST Normal respiratory effort. No intercostal retractions  LUNGS: Clear to auscultation bilaterally.  No stridor. No wheezes.  No rales.  CARDIOVASCULAR: Tachycardic, positive S1-S2, no murmurs rubs or gallops appreciated most likely secondary to tachycardia. ABDOMINAL: Nonobese, soft, nontender, nondistended, positive bowel sounds in all 4 quadrants, no apparent ascites.  EXTREMITIES: No peripheral edema, warm to touch bilaterally, old ulcers on the right lower extremity appear to be healed but present.  Documentation is noted bilaterally. HEMATOLOGIC: No significant bruising NEUROLOGIC: Oriented to person, place, and time. Nonfocal. Normal muscle tone.  PSYCHIATRIC: Normal mood and affect. Normal behavior. Cooperative  CARDIAC DATABASE: EKG: 11/22/2019: Sinus tachycardia, ventricular rate of 110 bpm, normal axis, nonspecific ST depressions and T wave abnormalities.  Echocardiogram: None  Stress Testing: 01/2013 At [facility]: Per report myocardial perfusion scan showed no evidence of pathology and has normal appearance.  Exercise myocardial perfusion study with no significant ischemia.  No significant wall motion abnormality noted.  LVEF 61%.  No EKG changes concerning for ischemia.  Heart Catheterization: None  LABORATORY DATA: CBC Latest Ref Rng & Units 10/13/2019 10/12/2019 10/12/2019  WBC 4.0 - 10.5 K/uL 4.7 5.3 5.6  Hemoglobin 12.0 - 15.0 g/dL 9.1(L) 9.9(L) 9.6(L)  Hematocrit 36.0 - 46.0 % 27.2(L) 29.2(L) 28.6(L)  Platelets 150 - 400 K/uL 216 235 243    CMP Latest Ref Rng & Units 10/13/2019 10/12/2019 10/12/2019  Glucose 70 - 99 mg/dL 247(H) 201(H) 251(H)  BUN 6 - 20 mg/dL _2 Creatinine 0.44 - 1.00 mg/dL 0.60 0.54 0.56  Sodium 135 - 145 mmol/L 136 138 138  Potassium 3.5 - 5.1 mmol/L 4.3 3.1(L) 3.4(L)  Chloride 98 - 111 mmol/L 108 104 104  CO2 22 - 32 mmol/L _3 Calcium 8.9 - 10.3 mg/dL 8.2(L) 8.5(L) 8.8(L)  Total Protein 6.5 - 8.1 g/dL - - -  Total Bilirubin 0.3 - 1.2 mg/dL - - -  Alkaline Phos 38 - 126 U/L - - -  AST 15 - 41 U/L - - -  ALT 0 - 44 U/L - - -   BNP (last 3  results) No results for input(s): BNP in the last 8760 hours.  ProBNP (last 3 results) No results for input(s): PROBNP in the last 8760 hours.  Lipid Panel     Component Value Date/Time   CHOL 228 (H) 10/12/2019 0834   TRIG 81 10/12/2019 0834   HDL 44 10/12/2019 0834   CHOLHDL 5.2 10/12/2019 0834   VLDL 16 10/12/2019 0834   LDLCALC 168 (H) 10/12/2019 0834   LDLDIRECT 104.1 04/12/2014 1608    Lab Results  Component Value Date   HGBA1C 14.4 (H) 10/12/2019   HGBA1C 13.6 (H) 03/30/2019   HGBA1C 10.5 (H) 04/12/2014   No components found for: NTPROBNP Lab Results  Component Value Date   TSH 1.016 10/12/2019   TSH  0.995 04/23/2019   TSH 0.97 09/08/2013   External Labs: Collected: October 28, 2019 Creatinine 0.59 mg/dL. eGFR: 125 mL/min per 1.73 m Alkaline phosphatase 392, AST 100, ALT 138 Hemoglobin 9.3 g/dL BNP: 244   FINAL MEDICATION LIST END OF ENCOUNTER: Meds ordered this encounter  Medications  . ezetimibe (ZETIA) 10 MG tablet    Sig: Take 1 tablet (10 mg total) by mouth daily.    Dispense:  90 tablet    Refill:  3  . losartan (COZAAR) 25 MG tablet    Sig: Take 1 tablet (25 mg total) by mouth every evening.    Dispense:  30 tablet    Refill:  0  . furosemide (LASIX) 20 MG tablet    Sig: Take 0.5 tablets (10 mg total) by mouth in the morning.    Dispense:  15 tablet    Refill:  0  . carvedilol (COREG) 6.25 MG tablet    Sig: Take 1 tablet (6.25 mg total) by mouth 2 (two) times daily with a meal.    Dispense:  180 tablet    Refill:  0    There are no discontinued medications.   Current Outpatient Medications:  .  albuterol (PROAIR HFA) 108 (90 Base) MCG/ACT inhaler, ProAir HFA 90 mcg/actuation aerosol inhaler  Inhale 2 puffs every 4 hours by inhalation route as needed., Disp: , Rfl:  .  blood glucose meter kit and supplies KIT, Dispense based on patient and insurance preference. Use up to four times daily as directed. (FOR ICD-10 E10.65)., Disp: 1 each, Rfl:  0 .  fluticasone (FLONASE) 50 MCG/ACT nasal spray, Flonase 50 mcg/actuation nasal spray,suspension  SPRAY 1 SPRAY INTO EACH NOSTRIL AT BEDTIME, Disp: , Rfl:  .  gabapentin (NEURONTIN) 300 MG capsule, Take 300 mg by mouth at bedtime., Disp: , Rfl:  .  glucagon 1 MG injection, Follow package directions for low blood sugar., Disp: 1 each, Rfl: 1 .  hydrocortisone 2.5 % cream, hydrocortisone 2.5 % topical cream  APPLY A THIN LAYER TO THE AFFECTED AREA(S) BY TOPICAL ROUTE 2 TIMES PER DAY FOR 3 DAYS MAX, Disp: , Rfl:  .  hydrOXYzine (VISTARIL) 50 MG capsule, Take 50 mg by mouth every 6 (six) hours as needed for anxiety. , Disp: , Rfl:  .  ibuprofen (ADVIL) 800 MG tablet, daily as needed., Disp: , Rfl:  .  insulin aspart (NOVOLOG FLEXPEN) 100 UNIT/ML FlexPen, Inject 9 Units into the skin 3 (three) times daily with meals. (Patient taking differently: Sliding scale), Disp: 5 pen, Rfl: 1 .  insulin degludec (TRESIBA FLEXTOUCH) 100 UNIT/ML FlexTouch Pen, Inject 0.5 mLs (50 Units total) into the skin at bedtime. (Patient taking differently: Inject 25 Units into the skin 2 (two) times daily. ), Disp: 10 pen, Rfl: 1 .  Insulin Pen Needle (PEN NEEDLES) 31G X 5 MM MISC, 1 each by Does not apply route 6 (six) times daily., Disp: 200 each, Rfl: 11 .  ketoconazole (NIZORAL) 2 % shampoo, daily as needed., Disp: , Rfl:  .  Lancets (ACCU-CHEK SOFT TOUCH) lancets, Check 3-4 times daily for uncontrolled insulin dependent type 1 diabetes, Disp: 100 each, Rfl: 12 .  Magnesium Hydroxide (DULCOLAX PO), Take 2 tablets by mouth daily. Chewable, Disp: , Rfl:  .  pantoprazole (PROTONIX) 40 MG tablet, Take 40 mg by mouth daily., Disp: , Rfl:  .  promethazine (PHENERGAN) 25 MG tablet, Take 25 mg by mouth every 4 (four) hours as needed for nausea or vomiting., Disp: ,  Rfl:  .  Psyllium (METAMUCIL PO), Take 1 packet by mouth daily. Mix with water, Disp: , Rfl:  .  senna-docusate (SENOKOT-S) 8.6-50 MG tablet, Take 1 tablet by mouth 2  (two) times daily between meals as needed for mild constipation., Disp: 180 tablet, Rfl: 1 .  carvedilol (COREG) 6.25 MG tablet, Take 1 tablet (6.25 mg total) by mouth 2 (two) times daily with a meal., Disp: 180 tablet, Rfl: 0 .  ezetimibe (ZETIA) 10 MG tablet, Take 1 tablet (10 mg total) by mouth daily., Disp: 90 tablet, Rfl: 3 .  furosemide (LASIX) 20 MG tablet, Take 0.5 tablets (10 mg total) by mouth in the morning., Disp: 15 tablet, Rfl: 0 .  losartan (COZAAR) 25 MG tablet, Take 1 tablet (25 mg total) by mouth every evening., Disp: 30 tablet, Rfl: 0  IMPRESSION:    ICD-10-CM   1. Dyspnea on exertion  R06.00 CMP14+EGFR    PCV ECHOCARDIOGRAM COMPLETE    losartan (COZAAR) 25 MG tablet    furosemide (LASIX) 20 MG tablet    carvedilol (COREG) 6.25 MG tablet    Basic metabolic panel    Magnesium  2. Other specified diabetes mellitus with hyperglycemia, with long-term current use of insulin (HCC)  E13.65 losartan (COZAAR) 25 MG tablet   Z79.4 carvedilol (COREG) 6.25 MG tablet  3. Long-term insulin use (HCC)  Z79.4   4. Leg edema  R60.0 EKG 12-Lead    Magnesium    Pro b natriuretic peptide (BNP)[facility])    CMP14+EGFR    PCV ECHOCARDIOGRAM COMPLETE    furosemide (LASIX) 20 MG tablet  5. Gastroesophageal reflux disease, unspecified whether esophagitis present  K21.9   6. Precordial chest pain  R07.2   7. Mixed hyperlipidemia  E78.2 ezetimibe (ZETIA) 10 MG tablet  8. Benign hypertension  I10 losartan (COZAAR) 25 MG tablet    carvedilol (COREG) 6.25 MG tablet    Basic metabolic panel    Magnesium     RECOMMENDATIONS: Juliany Daughety is a 49 y.o. female whose past medical history and cardiac risk factors include:  Insulin-dependent diabetes mellitus, history of diabetic ulcers, hypertension, hyperlipidemia.  Dyspnea on exertion:  Clinically patient appears to be euvolemic but describes an episode in February/March 2021 where she could have had  underlying congestive heart failure.  No echocardiograms available for review  Start Lasix 10 mg p.o. every morning.  Start losartan 25 mg p.o. every afternoon  Start Coreg 6.25 mg p.o. twice daily  Check CMP, magnesium, and BNP.  Echocardiogram will be ordered to evaluate for structural heart disease and left ventricular systolic function.  Lower extremity swelling: See above  Insulin-dependent diabetes mellitus:  Patient understands that her glycemic control is very uncontrolled given her most recent hemoglobin A1c.  Patient states that she is trying to establish care with endocrinology.  For now will defer to primary.  Benign essential hypertension: Uncontrolled . Office blood pressure is not at goal.  . Medication reconciled.   Start Lasix 10 mg p.o. every morning.  Start losartan 25 mg p.o. every afternoon  Start Coreg 6.25 mg p.o. twice daily . She is asked to keep a log of both blood pressure and pulse so that medications can be titrated based on a trend as opposed to isolated blood pressure readings in the office. . If the blood pressure is consistently greater than 156mHg patient is asked to call the office or her primary care provider's office for medication titration sooner than the next office  visit.  . Low salt diet recommended. A diet that is rich in fruits, vegetables, legumes, and low-fat dairy products and low in snacks, sweets, and meats (such as the Dietary Approaches to Stop Hypertension [DASH] diet).  . We will check BMP and magnesium in 1 week after starting medical therapy.  Precordial chest pain:  Her symptoms of chest discomfort are atypical in nature.  However, given the fact that she is poorly controlled insulin-dependent diabetes mellitus, and multiple cardiovascular risk factors she needs an ischemic evaluation.  I would like to get an echocardiogram initially to see what her LV function prior to deciding if she needs a stress test versus left heart  catheterization.  In the interim, patient understands that if her symptoms increase in intensity, frequency, and/or duration she should seek medical attention at the closest hospital via EMS.  Hyperlipidemia, mixed: Uncontrolled.  Patient is currently not on statin therapy.  Patient states that she was started on Lipitor.  But was discontinued for some reason.  It appears that patient is AST, ALT, and alkaline phosphatase increased after the initiation of Lipitor.  Based on the timeline of the events that she describes.  We will check a CMP prior to initiating statin therapy.  In the interim we will start Zetia 10 mg p.o. every morning.  Orders Placed This Encounter  Procedures  . Magnesium  . Pro b natriuretic peptide (BNP)[facility])  . CMP14+EGFR  . Basic metabolic panel  . Magnesium  . EKG 12-Lead  . PCV ECHOCARDIOGRAM COMPLETE   --Continue cardiac medications as reconciled in final medication list. --Return in about 3 weeks (around 12/13/2019) for review test results., re-evaluation of symptoms.. Or sooner if needed. --Continue follow-up with your primary care physician regarding the management of your other chronic comorbid conditions.  Patient's questions and concerns were addressed to her satisfaction. She voices understanding of the instructions provided during this encounter.   During this visit I reviewed and updated: Tobacco history  allergies medication reconciliation  medical history  surgical history  family history  social history.  This note was created using a voice recognition software as a result there may be grammatical errors inadvertently enclosed that do not reflect the nature of this encounter. Every attempt is made to correct such errors.  Rex Kras, [place], [facility]  Pager: [phone] Office: [phone]

## 2019-11-22 ENCOUNTER — Ambulatory Visit: Payer: BC Managed Care – PPO | Admitting: Cardiology

## 2019-11-22 ENCOUNTER — Other Ambulatory Visit: Payer: Self-pay

## 2019-11-22 ENCOUNTER — Encounter: Payer: Self-pay | Admitting: Cardiology

## 2019-11-22 ENCOUNTER — Other Ambulatory Visit: Payer: Self-pay | Admitting: Cardiology

## 2019-11-22 VITALS — BP 172/98 | HR 108 | Temp 98.0°F | Resp 18 | Ht 72.0 in | Wt 167.8 lb

## 2019-11-22 DIAGNOSIS — E1365 Other specified diabetes mellitus with hyperglycemia: Secondary | ICD-10-CM

## 2019-11-22 DIAGNOSIS — I10 Essential (primary) hypertension: Secondary | ICD-10-CM

## 2019-11-22 DIAGNOSIS — E782 Mixed hyperlipidemia: Secondary | ICD-10-CM

## 2019-11-22 DIAGNOSIS — K219 Gastro-esophageal reflux disease without esophagitis: Secondary | ICD-10-CM

## 2019-11-22 DIAGNOSIS — Z794 Long term (current) use of insulin: Secondary | ICD-10-CM

## 2019-11-22 DIAGNOSIS — R072 Precordial pain: Secondary | ICD-10-CM

## 2019-11-22 DIAGNOSIS — E1040 Type 1 diabetes mellitus with diabetic neuropathy, unspecified: Secondary | ICD-10-CM

## 2019-11-22 DIAGNOSIS — R600 Localized edema: Secondary | ICD-10-CM

## 2019-11-22 DIAGNOSIS — R0609 Other forms of dyspnea: Secondary | ICD-10-CM

## 2019-11-22 MED ORDER — FUROSEMIDE 20 MG PO TABS: 10.0000 mg | ORAL_TABLET | Freq: Every morning | ORAL | 0 refills | Status: DC

## 2019-11-22 MED ORDER — EZETIMIBE 10 MG PO TABS: 10.0000 mg | ORAL_TABLET | Freq: Every day | ORAL | 3 refills | Status: DC

## 2019-11-22 MED ORDER — LOSARTAN POTASSIUM 25 MG PO TABS: 25.0000 mg | ORAL_TABLET | Freq: Every evening | ORAL | 0 refills | Status: DC

## 2019-11-22 MED ORDER — CARVEDILOL 6.25 MG PO TABS: 6.2500 mg | ORAL_TABLET | Freq: Two times a day (BID) | ORAL | 0 refills | Status: DC

## 2019-11-22 NOTE — Patient Instructions (Signed)
Please remember to bring in your medication bottles in at the next visit.   New Medications that were added at today's visit:  Carvedilol 6.25 mg p.o. twice daily. Lasix 10 mg p.o. every morning. Losartan 25 mg p.o. every afternoon.  Medications that were discontinued at today's visit: None  Office will call you to have the following tests scheduled:  Echo  Please get labs done today and 1 week after starting medications at the nearest [place] follow up with your PCP as scheduled.

## 2019-11-24 ENCOUNTER — Ambulatory Visit: Payer: Self-pay | Admitting: Cardiology

## 2019-11-24 DIAGNOSIS — R600 Localized edema: Secondary | ICD-10-CM | POA: Diagnosis not present

## 2019-11-25 LAB — CMP14+EGFR
ALT: 12 IU/L (ref 0–32)
AST: 15 IU/L (ref 0–40)
Albumin/Globulin Ratio: 1.2 (ref 1.2–2.2)
Albumin: 3 g/dL — ABNORMAL LOW (ref 3.8–4.8)
Alkaline Phosphatase: 89 IU/L (ref 39–117)
BUN/Creatinine Ratio: 18 (ref 9–23)
BUN: 14 mg/dL (ref 6–24)
Bilirubin Total: 0.3 mg/dL (ref 0.0–1.2)
CO2: 21 mmol/L (ref 20–29)
Calcium: 8.3 mg/dL — ABNORMAL LOW (ref 8.7–10.2)
Chloride: 107 mmol/L — ABNORMAL HIGH (ref 96–106)
Creatinine, Ser: 0.79 mg/dL (ref 0.57–1.00)
GFR calc Af Amer: 102 mL/min/{1.73_m2} (ref >59)
GFR calc non Af Amer: 88 mL/min/{1.73_m2} (ref >59)
Globulin, Total: 2.5 g/dL (ref 1.5–4.5)
Glucose: 269 mg/dL — ABNORMAL HIGH (ref 65–99)
Potassium: 4.5 mmol/L (ref 3.5–5.2)
Sodium: 139 mmol/L (ref 134–144)
Total Protein: 5.5 g/dL — ABNORMAL LOW (ref 6.0–8.5)

## 2019-11-25 LAB — MAGNESIUM: Magnesium: 2.1 mg/dL (ref 1.6–2.3)

## 2019-11-25 LAB — PRO B NATRIURETIC PEPTIDE: NT-Pro BNP: 693 pg/mL — ABNORMAL HIGH (ref 0–249)

## 2019-11-28 ENCOUNTER — Other Ambulatory Visit: Payer: Self-pay | Admitting: Cardiology

## 2019-11-28 DIAGNOSIS — R0609 Other forms of dyspnea: Secondary | ICD-10-CM

## 2019-11-28 DIAGNOSIS — E1365 Other specified diabetes mellitus with hyperglycemia: Secondary | ICD-10-CM

## 2019-11-28 DIAGNOSIS — Z794 Long term (current) use of insulin: Secondary | ICD-10-CM

## 2019-11-30 ENCOUNTER — Telehealth: Payer: Self-pay

## 2019-11-30 NOTE — Telephone Encounter (Signed)
-----   Message from [place], [place] sent at 11/28/2019 10:17 PM EDT ----- Please send a copy of the labs to her PCP.  Have her follow-up with her PCP to address calcium levels, total protein and albumin.  Please have the patient repeat a BMP in 1 week since she was started on diuretics and losartan.  Labs ordered. We will also order a fasting lipid profile at that time.  Please make sure she is fasting for 10 to 12 hours if possible.

## 2019-12-02 ENCOUNTER — Encounter: Payer: Self-pay | Admitting: Gastroenterology

## 2019-12-02 DIAGNOSIS — E049 Nontoxic goiter, unspecified: Secondary | ICD-10-CM | POA: Diagnosis not present

## 2019-12-02 DIAGNOSIS — R634 Abnormal weight loss: Secondary | ICD-10-CM | POA: Insufficient documentation

## 2019-12-02 DIAGNOSIS — E1165 Type 2 diabetes mellitus with hyperglycemia: Secondary | ICD-10-CM | POA: Diagnosis not present

## 2019-12-02 DIAGNOSIS — K5900 Constipation, unspecified: Secondary | ICD-10-CM | POA: Insufficient documentation

## 2019-12-02 DIAGNOSIS — R1013 Epigastric pain: Secondary | ICD-10-CM | POA: Insufficient documentation

## 2019-12-02 HISTORY — DX: Abnormal weight loss: R63.4

## 2019-12-03 DIAGNOSIS — Z794 Long term (current) use of insulin: Secondary | ICD-10-CM | POA: Diagnosis not present

## 2019-12-03 DIAGNOSIS — R0600 Dyspnea, unspecified: Secondary | ICD-10-CM | POA: Diagnosis not present

## 2019-12-03 DIAGNOSIS — E1365 Other specified diabetes mellitus with hyperglycemia: Secondary | ICD-10-CM | POA: Diagnosis not present

## 2019-12-03 DIAGNOSIS — I10 Essential (primary) hypertension: Secondary | ICD-10-CM | POA: Diagnosis not present

## 2019-12-03 NOTE — Progress Notes (Signed)
Procedures cancelled and pt aware

## 2019-12-03 NOTE — Progress Notes (Signed)
The pt will call back after cardiology work up

## 2019-12-03 NOTE — Progress Notes (Signed)
____________________________________________________________  Attending physician addendum:  Thank you for sending this case to me. I have reviewed the entire note as well as recent labs and reports.  This appears most likely GI dysmotility from poorly-controlled diabetes (even if not gastroparesis).  The lower GI bleeding sounds most likely to be from chronic constipation, but she needs a rectal exam at next visit.  THIS PATIENT WILL NOT YET BE READY FOR ENDOSCOPIC PROCEDURES ON THE SCHEDULED DATE.  Her severe hypoalbuminemia is improved to 3.0, but her BNP is still elevated, she is anemic and her diabetes is under very poor control.  All that increases her risk of sedation-related cardiopulmonary complications as well as glucose and fluid/electrolyte disturbance from bowel preparation.  Please arrange clinic follow up with me in 4-6 weeks, by which time she should have had cardiology evaluation and time on her new diabetic regimen.  Wilfrid Lund, MD  ____________________________________________________________

## 2019-12-04 ENCOUNTER — Other Ambulatory Visit: Payer: Self-pay | Admitting: Cardiology

## 2019-12-04 DIAGNOSIS — I10 Essential (primary) hypertension: Secondary | ICD-10-CM

## 2019-12-04 DIAGNOSIS — E782 Mixed hyperlipidemia: Secondary | ICD-10-CM

## 2019-12-04 DIAGNOSIS — R600 Localized edema: Secondary | ICD-10-CM

## 2019-12-04 DIAGNOSIS — R0609 Other forms of dyspnea: Secondary | ICD-10-CM

## 2019-12-04 LAB — BASIC METABOLIC PANEL
BUN/Creatinine Ratio: 14 (ref 9–23)
BUN: 10 mg/dL (ref 6–24)
CO2: 24 mmol/L (ref 20–29)
Calcium: 8.5 mg/dL — ABNORMAL LOW (ref 8.7–10.2)
Chloride: 100 mmol/L (ref 96–106)
Creatinine, Ser: 0.72 mg/dL (ref 0.57–1.00)
GFR calc Af Amer: 114 mL/min/{1.73_m2} (ref >59)
GFR calc non Af Amer: 99 mL/min/{1.73_m2} (ref >59)
Glucose: 475 mg/dL — ABNORMAL HIGH (ref 65–99)
Potassium: 4.7 mmol/L (ref 3.5–5.2)
Sodium: 134 mmol/L (ref 134–144)

## 2019-12-04 LAB — MAGNESIUM: Magnesium: 1.9 mg/dL (ref 1.6–2.3)

## 2019-12-04 LAB — LIPID PANEL
Chol/HDL Ratio: 2.9 ratio (ref 0.0–4.4)
Cholesterol, Total: 242 mg/dL — ABNORMAL HIGH (ref 100–199)
HDL: 83 mg/dL (ref >39)
LDL Chol Calc (NIH): 145 mg/dL — ABNORMAL HIGH (ref 0–99)
Triglycerides: 85 mg/dL (ref 0–149)
VLDL Cholesterol Cal: 14 mg/dL (ref 5–40)

## 2019-12-04 LAB — PRO B NATRIURETIC PEPTIDE: NT-Pro BNP: 1283 pg/mL — ABNORMAL HIGH (ref 0–249)

## 2019-12-04 MED ORDER — FUROSEMIDE 20 MG PO TABS: 20.0000 mg | ORAL_TABLET | Freq: Every morning | ORAL | 0 refills | Status: DC

## 2019-12-04 MED ORDER — ROSUVASTATIN CALCIUM 20 MG PO TABS: 20.0000 mg | ORAL_TABLET | Freq: Every day | ORAL | 0 refills | Status: DC

## 2019-12-06 ENCOUNTER — Ambulatory Visit: Payer: BC Managed Care – PPO

## 2019-12-06 ENCOUNTER — Other Ambulatory Visit: Payer: Self-pay

## 2019-12-06 DIAGNOSIS — R0609 Other forms of dyspnea: Secondary | ICD-10-CM | POA: Diagnosis not present

## 2019-12-06 DIAGNOSIS — R600 Localized edema: Secondary | ICD-10-CM | POA: Diagnosis not present

## 2019-12-10 DIAGNOSIS — I10 Essential (primary) hypertension: Secondary | ICD-10-CM | POA: Diagnosis not present

## 2019-12-11 LAB — BASIC METABOLIC PANEL
BUN/Creatinine Ratio: 21 (ref 9–23)
BUN: 16 mg/dL (ref 6–24)
CO2: 25 mmol/L (ref 20–29)
Calcium: 8.8 mg/dL (ref 8.7–10.2)
Chloride: 100 mmol/L (ref 96–106)
Creatinine, Ser: 0.78 mg/dL (ref 0.57–1.00)
GFR calc Af Amer: 103 mL/min/{1.73_m2} (ref >59)
GFR calc non Af Amer: 90 mL/min/{1.73_m2} (ref >59)
Glucose: 491 mg/dL — ABNORMAL HIGH (ref 65–99)
Potassium: 4.5 mmol/L (ref 3.5–5.2)
Sodium: 136 mmol/L (ref 134–144)

## 2019-12-11 LAB — MAGNESIUM: Magnesium: 2 mg/dL (ref 1.6–2.3)

## 2019-12-11 LAB — PRO B NATRIURETIC PEPTIDE: NT-Pro BNP: 831 pg/mL — ABNORMAL HIGH (ref 0–249)

## 2019-12-13 DIAGNOSIS — E13621 Other specified diabetes mellitus with foot ulcer: Secondary | ICD-10-CM | POA: Diagnosis not present

## 2019-12-13 DIAGNOSIS — F419 Anxiety disorder, unspecified: Secondary | ICD-10-CM | POA: Diagnosis not present

## 2019-12-14 ENCOUNTER — Ambulatory Visit: Payer: BC Managed Care – PPO | Admitting: Cardiology

## 2019-12-14 ENCOUNTER — Other Ambulatory Visit: Payer: Self-pay

## 2019-12-14 ENCOUNTER — Encounter: Payer: Self-pay | Admitting: Cardiology

## 2019-12-14 VITALS — BP 158/92 | HR 102 | Temp 97.2°F | Resp 18 | Ht 72.0 in | Wt 156.0 lb

## 2019-12-14 DIAGNOSIS — Z794 Long term (current) use of insulin: Secondary | ICD-10-CM

## 2019-12-14 DIAGNOSIS — I10 Essential (primary) hypertension: Secondary | ICD-10-CM | POA: Diagnosis not present

## 2019-12-14 DIAGNOSIS — R600 Localized edema: Secondary | ICD-10-CM

## 2019-12-14 DIAGNOSIS — R0609 Other forms of dyspnea: Secondary | ICD-10-CM | POA: Diagnosis not present

## 2019-12-14 DIAGNOSIS — I5032 Chronic diastolic (congestive) heart failure: Secondary | ICD-10-CM

## 2019-12-14 DIAGNOSIS — E782 Mixed hyperlipidemia: Secondary | ICD-10-CM

## 2019-12-14 DIAGNOSIS — I5031 Acute diastolic (congestive) heart failure: Secondary | ICD-10-CM | POA: Diagnosis not present

## 2019-12-14 DIAGNOSIS — I5030 Unspecified diastolic (congestive) heart failure: Secondary | ICD-10-CM | POA: Insufficient documentation

## 2019-12-14 DIAGNOSIS — E049 Nontoxic goiter, unspecified: Secondary | ICD-10-CM | POA: Diagnosis not present

## 2019-12-14 DIAGNOSIS — E1165 Type 2 diabetes mellitus with hyperglycemia: Secondary | ICD-10-CM | POA: Diagnosis not present

## 2019-12-14 HISTORY — DX: Chronic diastolic (congestive) heart failure: I50.32

## 2019-12-14 MED ORDER — CARVEDILOL 12.5 MG PO TABS: 12.5000 mg | ORAL_TABLET | Freq: Two times a day (BID) | ORAL | 0 refills | Status: DC

## 2019-12-14 MED ORDER — FUROSEMIDE 40 MG PO TABS: 40.0000 mg | ORAL_TABLET | Freq: Every morning | ORAL | 0 refills | Status: DC

## 2019-12-14 NOTE — Progress Notes (Signed)
[name] Date of Birth: 1970-09-10 MRN: [id number] [name], [name], [place] Primary Cardiologist: Rex Kras, DO (established care 11/2019)  Date: 12/14/19 Last Office Visit: 11/22/2019  Chief Complaint  Patient presents with  . Shortness of Breath  . Follow-up  . Results   HPI  [name] is a 49 y.o. female who presents to the office with a chief complaint of " shortness of breath and follow-up on test results" Patient's past medical history and cardiac risk factors include: Insulin-dependent diabetes mellitus, history of diabetic ulcers, hypertension, hyperlipidemia, acute on chronic HFpEF.  Patient was initially seen in consultation back in April 2021 at the request of her primary care provider for evaluation of leg swelling and congestive heart failure.  During the initial visit patient was not in congestive heart failure clinically; however, patient states in January 2021 she was diuresed with Lasix due to generalized swelling.  At the last office visit patient was experiencing dyspnea on exertion, orthopnea, PND, and lower extremity swelling but there was significantly better compared to what she experienced back in January 2021.  In addition, patient also noted atypical chest pain.  She was medication naviee so I had started on guideline directed medical therapy with the initiation of losartan, carvedilol, and Lasix.  She was recommended to undergo an echocardiogram as well.  Echocardiogram results reviewed with the patient and noted below for further reference.  She had blood work 1 week after to reevaluate kidney function and NT proBNP.  Patient's diuretic therapy was increased to 20 mg p.o. every morning.  And was also started on statin therapy given her fasting lipid profile noting an LDL level of 145 mg/dL in the setting of insulin-dependent diabetes mellitus type 2.  Repeat blood work on Lasix 20 mg p.o. every morning noted  improvement in her NT proBNP.  Since last visit patient states that she is been doing well but continues to have symptoms of effort related dyspnea and chest discomfort.  The intensity and severity of her symptoms have not worsened since last visit.  She describes the chest discomfort as substernally located, at worst intensity was 8 out of 10, heavy-like sensation, lasting for 15 minutes, more noticeable at rest, not associated with effort related activities, nonradiating.  Patient states that the pain could be secondary to her underlying gastroparesis and/or GERD.  She has had this discomfort for some time.  Of note since last visit patient has established care with endocrinology and has started on insulin therapy.  Patient's mother passed away at the age of 50 secondary congestive heart failure.  Denies prior history of coronary artery disease, myocardial infarction, congestive heart failure, deep venous thrombosis, pulmonary embolism, stroke, transient ischemic attack.  ALLERGIES: Allergies  Allergen Reactions  . Amoxicillin Itching  . Lantus [Insulin Glargine] Itching and Rash   MEDICATION LIST PRIOR TO VISIT: Current Outpatient Medications on File Prior to Visit  Medication Sig Dispense Refill  . albuterol (PROAIR HFA) 108 (90 Base) MCG/ACT inhaler ProAir HFA 90 mcg/actuation aerosol inhaler  Inhale 2 puffs every 4 hours by inhalation route as needed.    . blood glucose meter kit and supplies KIT Dispense based on patient and insurance preference. Use up to four times daily as directed. (FOR ICD-10 E10.65). 1 each 0  . DULoxetine (CYMBALTA) 60 MG capsule Take 60 mg by mouth daily.    Marland Kitchen ezetimibe (ZETIA) 10 MG tablet Take 1 tablet (10 mg total) by mouth daily. 90 tablet 3  . fluticasone (  FLONASE) 50 MCG/ACT nasal spray Flonase 50 mcg/actuation nasal spray,suspension  SPRAY 1 SPRAY INTO EACH NOSTRIL AT BEDTIME    . gabapentin (NEURONTIN) 300 MG capsule Take 300 mg by mouth at bedtime.     Marland Kitchen glucagon 1 MG injection Follow package directions for low blood sugar. 1 each 1  . hydrocortisone 2.5 % cream hydrocortisone 2.5 % topical cream  APPLY A THIN LAYER TO THE AFFECTED AREA(S) BY TOPICAL ROUTE 2 TIMES PER DAY FOR 3 DAYS MAX    . ibuprofen (ADVIL) 800 MG tablet daily as needed.    . insulin aspart (NOVOLOG FLEXPEN) 100 UNIT/ML FlexPen Inject 9 Units into the skin 3 (three) times daily with meals. (Patient taking differently: Sliding scale) 5 pen 1  . insulin degludec (TRESIBA FLEXTOUCH) 100 UNIT/ML FlexTouch Pen Inject 0.5 mLs (50 Units total) into the skin at bedtime. (Patient taking differently: Inject 25 Units into the skin 2 (two) times daily. ) 10 pen 1  . Insulin Pen Needle (PEN NEEDLES) 31G X 5 MM MISC 1 each by Does not apply route 6 (six) times daily. 200 each 11  . ketoconazole (NIZORAL) 2 % shampoo daily as needed.    . Lancets (ACCU-CHEK SOFT TOUCH) lancets Check 3-4 times daily for uncontrolled insulin dependent type 1 diabetes 100 each 12  . LORazepam (ATIVAN) 0.5 MG tablet Take 0.5 mg by mouth 3 (three) times daily as needed.    Marland Kitchen losartan (COZAAR) 25 MG tablet Take 1 tablet (25 mg total) by mouth every evening. 30 tablet 0  . Magnesium Hydroxide (DULCOLAX PO) Take 2 tablets by mouth daily. Chewable    . norethindrone (AYGESTIN) 5 MG tablet Take 5 mg by mouth daily.    . pantoprazole (PROTONIX) 40 MG tablet Take 40 mg by mouth daily.    . promethazine (PHENERGAN) 25 MG tablet Take 25 mg by mouth every 4 (four) hours as needed for nausea or vomiting.    . Psyllium (METAMUCIL PO) Take 1 packet by mouth daily. Mix with water    . rosuvastatin (CRESTOR) 20 MG tablet Take 1 tablet (20 mg total) by mouth daily. 90 tablet 0  . senna-docusate (SENOKOT-S) 8.6-50 MG tablet Take 1 tablet by mouth 2 (two) times daily between meals as needed for mild constipation. 180 tablet 1   No current facility-administered medications on file prior to visit.    PAST MEDICAL HISTORY: Past  Medical History:  Diagnosis Date  . Arthritis   . History of chicken pox   . Migraines   . Mood disorder (HCC)    anxiety  . Prurigo nodularis    with diabetic dermopathy  . Type 1 diabetes, uncontrolled, with neuropathy ([place])    [place]    PAST SURGICAL HISTORY: Past Surgical History:  Procedure Laterality Date  . treadmill stress test  01/2013   WNL, low risk study    FAMILY HISTORY: The patient family history includes Bipolar disorder in her mother; CAD in her maternal aunt; Calcium disorder in her mother; Cancer in her maternal grandmother, maternal uncle, and paternal grandmother; Cancer (age of onset: 49) in her maternal aunt; Diabetes in her mother, paternal grandfather, paternal grandmother, and sister; Heart disease in her mother; Heart failure in her sister; Hypertension in her mother; Stroke in her maternal aunt; Thyroid disease in her mother.   SOCIAL HISTORY:  The patient  reports that she has never smoked. She has never used smokeless tobacco. She reports current alcohol use. She reports that she does  not use drugs.  REVIEW OF SYSTEMS: Review of Systems  Constitution: Negative for chills and fever.  HENT: Negative for ear discharge, ear pain and nosebleeds.   Eyes: Negative for blurred vision and discharge.  Cardiovascular: Positive for chest pain, dyspnea on exertion, leg swelling and orthopnea. Negative for claudication, near-syncope, palpitations, paroxysmal nocturnal dyspnea and syncope.  Respiratory: Positive for shortness of breath. Negative for cough.   Endocrine: Positive for cold intolerance. Negative for polydipsia, polyphagia and polyuria.  Hematologic/Lymphatic: Negative for bleeding problem.  Skin: Negative for flushing and nail changes.  Musculoskeletal: Negative for muscle cramps, muscle weakness and myalgias.  Gastrointestinal: Positive for heartburn. Negative for abdominal pain, dysphagia, hematemesis, hematochezia, melena, nausea and vomiting.    Neurological: Positive for paresthesias. Negative for dizziness, focal weakness and light-headedness.   PHYSICAL EXAM: Vitals with BMI 12/14/2019 11/22/2019 11/19/2019  Height _0  _1  _2   Weight 156 lbs 167 lbs 13 oz 165 lbs  BMI 21.15 96.75 91.63  Systolic 846 659 935  Diastolic 92 98 80  Pulse 701 108 108    CONSTITUTIONAL: Well-developed and well-nourished. No acute distress.  SKIN: Skin is warm and dry. No rash noted. No cyanosis. No pallor. No jaundice HEAD: Normocephalic and atraumatic.  EYES: No scleral icterus MOUTH/THROAT: Moist oral membranes.  NECK: No JVD present. No thyromegaly noted. No carotid bruits  LYMPHATIC: No visible cervical adenopathy.  CHEST Normal respiratory effort. No intercostal retractions  LUNGS: Clear to auscultation bilaterally.  No stridor. No wheezes. No rales.  CARDIOVASCULAR: Tachycardic, positive S1-S2, no murmurs rubs or gallops appreciated most likely secondary to tachycardia. ABDOMINAL: Nonobese, soft, nontender, nondistended, positive bowel sounds in all 4 quadrants, no apparent ascites.  EXTREMITIES: No peripheral edema, warm to touch bilaterally, old ulcers on the right lower extremity appear to be healed but present.  Documentation is noted bilaterally. HEMATOLOGIC: No significant bruising NEUROLOGIC: Oriented to person, place, and time. Nonfocal. Normal muscle tone.  PSYCHIATRIC: Normal mood and affect. Normal behavior. Cooperative  CARDIAC DATABASE: EKG: 11/22/2019: Sinus tachycardia, ventricular rate of 110 bpm, normal axis, nonspecific ST depressions and T wave abnormalities.  Echocardiogram: 12/06/2019: LVEF 50-55%, mild LVH, grade 3 diastolic dysfunction, elevated left atrial pressure, severely dilated left atrium, mild MR, mild TR, mild PR.  Stress Testing: 01/2013 At [facility]: Per report myocardial perfusion scan showed no evidence of pathology and has normal appearance.  Exercise myocardial perfusion  study with no significant ischemia.  No significant wall motion abnormality noted.  LVEF 61%.  No EKG changes concerning for ischemia.  Heart Catheterization: None  LABORATORY DATA: CBC Latest Ref Rng & Units 10/13/2019 10/12/2019 10/12/2019  WBC 4.0 - 10.5 K/uL 4.7 5.3 5.6  Hemoglobin 12.0 - 15.0 g/dL 9.1(L) 9.9(L) 9.6(L)  Hematocrit 36.0 - 46.0 % 27.2(L) 29.2(L) 28.6(L)  Platelets 150 - 400 K/uL 216 235 243    CMP Latest Ref Rng & Units 12/10/2019 12/03/2019 11/24/2019  Glucose 65 - 99 mg/dL 491(H) 475(H) 269(H)  BUN 6 - 24 mg/dL _3 Creatinine 0.57 - 1.00 mg/dL 0.78 0.72 0.79  Sodium 134 - 144 mmol/L 136 134 139  Potassium 3.5 - 5.2 mmol/L 4.5 4.7 4.5  Chloride 96 - 106 mmol/L 100 100 107(H)  CO2 20 - 29 mmol/L _4 Calcium 8.7 - 10.2 mg/dL 8.8 8.5(L) 8.3(L)  Total Protein 6.0 - 8.5 g/dL - - 5.5(L)  Total Bilirubin 0.0 - 1.2 mg/dL - - 0.3  Alkaline Phos 39 -  117 IU/L - - 89  AST 0 - 40 IU/L - - 15  ALT 0 - 32 IU/L - - 12   BNP (last 3 results) No results for input(s): BNP in the last 8760 hours.  ProBNP (last 3 results) Recent Labs    11/24/19 0934 12/03/19 0951 12/10/19 1116  PROBNP 693* 1,283* 831*    Lipid Panel     Component Value Date/Time   CHOL 242 (H) 12/03/2019 0952   TRIG 85 12/03/2019 0952   HDL 83 12/03/2019 0952   CHOLHDL 2.9 12/03/2019 0952   CHOLHDL 5.2 10/12/2019 0834   VLDL 16 10/12/2019 0834   LDLCALC 145 (H) 12/03/2019 0952   LDLDIRECT 104.1 04/12/2014 1608   LABVLDL 14 12/03/2019 0952    Lab Results  Component Value Date   HGBA1C 14.4 (H) 10/12/2019   HGBA1C 13.6 (H) 03/30/2019   HGBA1C 10.5 (H) 04/12/2014   No components found for: NTPROBNP Lab Results  Component Value Date   TSH 1.016 10/12/2019   TSH 0.995 04/23/2019   TSH 0.97 09/08/2013   FINAL MEDICATION LIST END OF ENCOUNTER: Meds ordered this encounter  Medications  . carvedilol (COREG) 12.5 MG tablet    Sig: Take 1 tablet (12.5 mg total) by mouth 2 (two) times  daily. Hold if systolic blood pressure (top blood pressure number) less than 100 mmHg or heart rate less than 60 bpm (pulse).    Dispense:  180 tablet    Refill:  0  . furosemide (LASIX) 40 MG tablet    Sig: Take 1 tablet (40 mg total) by mouth in the morning.    Dispense:  90 tablet    Refill:  0    Medications Discontinued During This Encounter  Medication Reason  . hydrOXYzine (VISTARIL) 50 MG capsule Patient Preference  . carvedilol (COREG) 6.25 MG tablet Dose change  . furosemide (LASIX) 20 MG tablet      Current Outpatient Medications:  .  albuterol (PROAIR HFA) 108 (90 Base) MCG/ACT inhaler, ProAir HFA 90 mcg/actuation aerosol inhaler  Inhale 2 puffs every 4 hours by inhalation route as needed., Disp: , Rfl:  .  blood glucose meter kit and supplies KIT, Dispense based on patient and insurance preference. Use up to four times daily as directed. (FOR ICD-10 E10.65)., Disp: 1 each, Rfl: 0 .  DULoxetine (CYMBALTA) 60 MG capsule, Take 60 mg by mouth daily., Disp: , Rfl:  .  ezetimibe (ZETIA) 10 MG tablet, Take 1 tablet (10 mg total) by mouth daily., Disp: 90 tablet, Rfl: 3 .  fluticasone (FLONASE) 50 MCG/ACT nasal spray, Flonase 50 mcg/actuation nasal spray,suspension  SPRAY 1 SPRAY INTO EACH NOSTRIL AT BEDTIME, Disp: , Rfl:  .  furosemide (LASIX) 40 MG tablet, Take 1 tablet (40 mg total) by mouth in the morning., Disp: 90 tablet, Rfl: 0 .  gabapentin (NEURONTIN) 300 MG capsule, Take 300 mg by mouth at bedtime., Disp: , Rfl:  .  glucagon 1 MG injection, Follow package directions for low blood sugar., Disp: 1 each, Rfl: 1 .  hydrocortisone 2.5 % cream, hydrocortisone 2.5 % topical cream  APPLY A THIN LAYER TO THE AFFECTED AREA(S) BY TOPICAL ROUTE 2 TIMES PER DAY FOR 3 DAYS MAX, Disp: , Rfl:  .  ibuprofen (ADVIL) 800 MG tablet, daily as needed., Disp: , Rfl:  .  insulin aspart (NOVOLOG FLEXPEN) 100 UNIT/ML FlexPen, Inject 9 Units into the skin 3 (three) times daily with meals. (Patient  taking differently: Sliding scale), Disp: 5  pen, Rfl: 1 .  insulin degludec (TRESIBA FLEXTOUCH) 100 UNIT/ML FlexTouch Pen, Inject 0.5 mLs (50 Units total) into the skin at bedtime. (Patient taking differently: Inject 25 Units into the skin 2 (two) times daily. ), Disp: 10 pen, Rfl: 1 .  Insulin Pen Needle (PEN NEEDLES) 31G X 5 MM MISC, 1 each by Does not apply route 6 (six) times daily., Disp: 200 each, Rfl: 11 .  ketoconazole (NIZORAL) 2 % shampoo, daily as needed., Disp: , Rfl:  .  Lancets (ACCU-CHEK SOFT TOUCH) lancets, Check 3-4 times daily for uncontrolled insulin dependent type 1 diabetes, Disp: 100 each, Rfl: 12 .  LORazepam (ATIVAN) 0.5 MG tablet, Take 0.5 mg by mouth 3 (three) times daily as needed., Disp: , Rfl:  .  losartan (COZAAR) 25 MG tablet, Take 1 tablet (25 mg total) by mouth every evening., Disp: 30 tablet, Rfl: 0 .  Magnesium Hydroxide (DULCOLAX PO), Take 2 tablets by mouth daily. Chewable, Disp: , Rfl:  .  norethindrone (AYGESTIN) 5 MG tablet, Take 5 mg by mouth daily., Disp: , Rfl:  .  pantoprazole (PROTONIX) 40 MG tablet, Take 40 mg by mouth daily., Disp: , Rfl:  .  promethazine (PHENERGAN) 25 MG tablet, Take 25 mg by mouth every 4 (four) hours as needed for nausea or vomiting., Disp: , Rfl:  .  Psyllium (METAMUCIL PO), Take 1 packet by mouth daily. Mix with water, Disp: , Rfl:  .  rosuvastatin (CRESTOR) 20 MG tablet, Take 1 tablet (20 mg total) by mouth daily., Disp: 90 tablet, Rfl: 0 .  senna-docusate (SENOKOT-S) 8.6-50 MG tablet, Take 1 tablet by mouth 2 (two) times daily between meals as needed for mild constipation., Disp: 180 tablet, Rfl: 1 .  carvedilol (COREG) 12.5 MG tablet, Take 1 tablet (12.5 mg total) by mouth 2 (two) times daily. Hold if systolic blood pressure (top blood pressure number) less than 100 mmHg or heart rate less than 60 bpm (pulse)., Disp: 180 tablet, Rfl: 0  IMPRESSION:    ICD-10-CM   1. Acute heart failure with preserved ejection fraction (HFpEF)  (HCC)  I50.31 carvedilol (COREG) 12.5 MG tablet    furosemide (LASIX) 40 MG tablet    Basic metabolic panel    Magnesium    PCV MYOCARDIAL PERFUSION WITH LEXISCAN  2. Leg edema  R60.0 furosemide (LASIX) 40 MG tablet    Basic metabolic panel    Magnesium  3. Dyspnea on exertion  R06.00 furosemide (LASIX) 40 MG tablet    PCV MYOCARDIAL PERFUSION WITH LEXISCAN  4. Benign hypertension  I10 carvedilol (COREG) 12.5 MG tablet    furosemide (LASIX) 40 MG tablet    Basic metabolic panel    Magnesium  5. Mixed hyperlipidemia  E78.2   6. Long-term insulin use (HCC)  Z79.4 PCV MYOCARDIAL PERFUSION WITH LEXISCAN  7. Type 2 diabetes mellitus with hyperglycemia, with long-term current use of insulin (HCC)  E11.65 PCV MYOCARDIAL PERFUSION WITH LEXISCAN   Z79.4      RECOMMENDATIONS: [name] is a 49 y.o. female whose past medical history and cardiac risk factors include:  Insulin-dependent diabetes mellitus, history of diabetic ulcers, hypertension, hyperlipidemia, acute on chronic HFpEF.  Acute on chronic heart failure with preserved EF:  Increase Lasix from 20 mg p.o. every morning to 40 mg p.o. every morning.  Increase carvedilol to 12.5 mg p.o. twice daily.  Continue losartan.  Patient's echocardiogram notes grade 3 diastolic dysfunction with elevated left atrial pressures; therefore, increasing diuretic therapy.  BNP levels  noted above for review.  Recommend daily weight check, strict I/O's  Fluid restriction to <2L per day, Na restriction < 2g per day  Dyspnea on exertion: See above  Lower extremity swelling: Improving, see above  Precordial chest pain:  Her symptoms of chest discomfort have both typical and atypical features.    However, given the fact that she is poorly controlled insulin-dependent diabetes mellitus, and multiple cardiovascular risk factors she needs an ischemic evaluation.  We discussed the benefits and risks of both noninvasive stress testing  versus invasive angiography.  Shared decision was to proceed with nuclear stress test to evaluate for reversible ischemia.    In the meantime we will increase antianginal therapy and diuretics for symptom management.  Will reevaluate.   In the interim, patient understands that if her symptoms increase in intensity, frequency, and/or duration she should seek medical attention at the closest hospital via EMS.  Insulin-dependent diabetes mellitus: Patient understands that her glycemic control is very uncontrolled given her most recent hemoglobin A1c.  She has established with endocrinology since last office visit and started on insulin therapy.   Benign essential hypertension: Not well controlled . Medication reconciled.  . See medication changes above.  . She is asked to keep a log of both blood pressure and pulse so that medications can be titrated based on a trend as opposed to isolated blood pressure readings in the office. . If the blood pressure is consistently greater than 1101mHg patient is asked to call the office or her primary care provider's office for medication titration sooner than the next office visit.  . Low salt diet recommended. A diet that is rich in fruits, vegetables, legumes, and low-fat dairy products and low in snacks, sweets, and meats (such as the Dietary Approaches to Stop Hypertension [DASH] diet).  . We will check BMP and magnesium in 1 week after starting medical therapy.  Hyperlipidemia, mixed:   LDL currently not at goal.  Started on rosuvastatin 20 mg p.o. nightly, on Dec 04, 2019.  We will check a repeat lipid profile in 6 weeks to reevaluate therapy.    Continue Zetia.    Patient does not endorse any evidence of myalgias.    Orders Placed This Encounter  Procedures  . Basic metabolic panel  . Magnesium  . PCV MYOCARDIAL PERFUSION WITH LEXISCAN   --Continue cardiac medications as reconciled in final medication list. --Return in about 16 days (around  12/30/2019) for re-evaluation of symptoms., review test results.. Or sooner if needed. --Continue follow-up with your primary care physician regarding the management of your other chronic comorbid conditions.  Patient's questions and concerns were addressed to her satisfaction. She voices understanding of the instructions provided during this encounter.   During this visit I reviewed and updated: Tobacco history  allergies medication reconciliation  medical history  surgical history  family history  social history.  This note was created using a voice recognition software as a result there may be grammatical errors inadvertently enclosed that do not reflect the nature of this encounter. Every attempt is made to correct such errors.  SRex Kras DNevada FUniversity Of Md Shore Medical Ctr At Chestertown Pager: [phone]: [phone]

## 2019-12-15 ENCOUNTER — Other Ambulatory Visit: Payer: Self-pay | Admitting: Internal Medicine

## 2019-12-15 ENCOUNTER — Other Ambulatory Visit: Payer: Self-pay

## 2019-12-15 ENCOUNTER — Ambulatory Visit: Payer: BC Managed Care – PPO | Admitting: Podiatry

## 2019-12-15 DIAGNOSIS — E1165 Type 2 diabetes mellitus with hyperglycemia: Secondary | ICD-10-CM | POA: Diagnosis not present

## 2019-12-15 DIAGNOSIS — R600 Localized edema: Secondary | ICD-10-CM

## 2019-12-15 DIAGNOSIS — E049 Nontoxic goiter, unspecified: Secondary | ICD-10-CM

## 2019-12-15 DIAGNOSIS — I5031 Acute diastolic (congestive) heart failure: Secondary | ICD-10-CM

## 2019-12-15 DIAGNOSIS — Z794 Long term (current) use of insulin: Secondary | ICD-10-CM | POA: Diagnosis not present

## 2019-12-15 DIAGNOSIS — I10 Essential (primary) hypertension: Secondary | ICD-10-CM

## 2019-12-16 ENCOUNTER — Other Ambulatory Visit: Payer: Self-pay | Admitting: Cardiology

## 2019-12-16 DIAGNOSIS — R0609 Other forms of dyspnea: Secondary | ICD-10-CM

## 2019-12-16 DIAGNOSIS — Z794 Long term (current) use of insulin: Secondary | ICD-10-CM

## 2019-12-16 DIAGNOSIS — I10 Essential (primary) hypertension: Secondary | ICD-10-CM

## 2019-12-16 DIAGNOSIS — R600 Localized edema: Secondary | ICD-10-CM

## 2019-12-20 ENCOUNTER — Other Ambulatory Visit: Payer: Self-pay

## 2019-12-20 ENCOUNTER — Ambulatory Visit: Payer: BC Managed Care – PPO

## 2019-12-20 DIAGNOSIS — E1165 Type 2 diabetes mellitus with hyperglycemia: Secondary | ICD-10-CM | POA: Diagnosis not present

## 2019-12-20 DIAGNOSIS — I5031 Acute diastolic (congestive) heart failure: Secondary | ICD-10-CM | POA: Diagnosis not present

## 2019-12-20 DIAGNOSIS — Z794 Long term (current) use of insulin: Secondary | ICD-10-CM | POA: Diagnosis not present

## 2019-12-20 DIAGNOSIS — R0609 Other forms of dyspnea: Secondary | ICD-10-CM

## 2019-12-21 NOTE — Progress Notes (Signed)
Relayed information to patient. Patient voiced understanding and will be present at their upcoming appt.

## 2019-12-27 ENCOUNTER — Encounter: Payer: Self-pay | Admitting: Gastroenterology

## 2019-12-28 DIAGNOSIS — I10 Essential (primary) hypertension: Secondary | ICD-10-CM | POA: Diagnosis not present

## 2019-12-28 DIAGNOSIS — I5031 Acute diastolic (congestive) heart failure: Secondary | ICD-10-CM | POA: Diagnosis not present

## 2019-12-28 DIAGNOSIS — R600 Localized edema: Secondary | ICD-10-CM | POA: Diagnosis not present

## 2019-12-29 LAB — BASIC METABOLIC PANEL
BUN/Creatinine Ratio: 24 — ABNORMAL HIGH (ref 9–23)
BUN: 18 mg/dL (ref 6–24)
CO2: 22 mmol/L (ref 20–29)
Calcium: 8.5 mg/dL — ABNORMAL LOW (ref 8.7–10.2)
Chloride: 109 mmol/L — ABNORMAL HIGH (ref 96–106)
Creatinine, Ser: 0.75 mg/dL (ref 0.57–1.00)
GFR calc Af Amer: 108 mL/min/{1.73_m2} (ref >59)
GFR calc non Af Amer: 94 mL/min/{1.73_m2} (ref >59)
Glucose: 110 mg/dL — ABNORMAL HIGH (ref 65–99)
Potassium: 4.4 mmol/L (ref 3.5–5.2)
Sodium: 143 mmol/L (ref 134–144)

## 2019-12-29 LAB — MAGNESIUM: Magnesium: 2 mg/dL (ref 1.6–2.3)

## 2019-12-30 ENCOUNTER — Ambulatory Visit
Admission: RE | Admit: 2019-12-30 | Discharge: 2019-12-30 | Disposition: A | Discharge status: Home / Self Care | Payer: BC Managed Care – PPO | Source: Ambulatory Visit | Attending: Internal Medicine | Admitting: Internal Medicine

## 2019-12-30 ENCOUNTER — Other Ambulatory Visit: Payer: Self-pay

## 2019-12-30 ENCOUNTER — Ambulatory Visit: Payer: BC Managed Care – PPO | Admitting: Cardiology

## 2019-12-30 ENCOUNTER — Encounter: Payer: Self-pay | Admitting: Cardiology

## 2019-12-30 VITALS — BP 167/98 | HR 100 | Ht 72.0 in | Wt 173.0 lb

## 2019-12-30 DIAGNOSIS — R0609 Other forms of dyspnea: Secondary | ICD-10-CM | POA: Diagnosis not present

## 2019-12-30 DIAGNOSIS — I5031 Acute diastolic (congestive) heart failure: Secondary | ICD-10-CM

## 2019-12-30 DIAGNOSIS — E782 Mixed hyperlipidemia: Secondary | ICD-10-CM

## 2019-12-30 DIAGNOSIS — I10 Essential (primary) hypertension: Secondary | ICD-10-CM | POA: Diagnosis not present

## 2019-12-30 DIAGNOSIS — Z794 Long term (current) use of insulin: Secondary | ICD-10-CM

## 2019-12-30 DIAGNOSIS — R072 Precordial pain: Secondary | ICD-10-CM | POA: Diagnosis not present

## 2019-12-30 DIAGNOSIS — E041 Nontoxic single thyroid nodule: Secondary | ICD-10-CM | POA: Diagnosis not present

## 2019-12-30 DIAGNOSIS — E049 Nontoxic goiter, unspecified: Secondary | ICD-10-CM

## 2019-12-30 MED ORDER — METOPROLOL SUCCINATE ER 50 MG PO TB24: 50.0000 mg | ORAL_TABLET | Freq: Every day | ORAL | 0 refills | Status: DC

## 2019-12-30 MED ORDER — FUROSEMIDE 20 MG PO TABS: 20.0000 mg | ORAL_TABLET | Freq: Every day | ORAL | 0 refills | Status: DC

## 2019-12-30 MED ORDER — ISOSORBIDE MONONITRATE ER 30 MG PO TB24: 30.0000 mg | ORAL_TABLET | Freq: Every day | ORAL | 0 refills | Status: DC

## 2019-12-30 MED ORDER — ENTRESTO 24-26 MG PO TABS: 1.0000 | ORAL_TABLET | Freq: Two times a day (BID) | ORAL | 0 refills | Status: DC

## 2019-12-30 NOTE — Progress Notes (Signed)
[name] Date of Birth: 1970-11-17 MRN: [id number] [place], [name], [place] Primary Cardiologist: Rex Kras, DO (established care 11/2019)  Date: 12/30/2019 Last Office Visit: 12/14/2019  Chief Complaint  Patient presents with  . HFpEF  . Results    echo and Lexi  . Chest Pain  . Shortness of Breath   HPI  [name] is a 49 y.o. female who presents to the office with a chief complaint of " chest pain / shortness of breath and follow-up on test results" Patient's past medical history and cardiac risk factors include: Insulin-dependent diabetes mellitus, history of diabetic ulcers, hypertension, hyperlipidemia, acute on chronic HFpEF.  Patient was initially seen in consultation back in April 2021 at the request of her primary care provider for evaluation of leg swelling and congestive heart failure.  During the initial visit patient was not in congestive heart failure clinically; however, patient states in January 2021 she was diuresed with Lasix due to generalized swelling.  Since then, patient has undergone an echocardiogram which noted preserved left ventricular systolic function and grade 3 diastolic impairment with elevated left atrial pressure and severely dilated left atrium.  Patient was started on diuretic therapy to help improve the hemodynamics and also her symptoms.  Her most recent blood work shows stable kidney function and electrolytes.  Since last office visit patient has also undergone a nuclear stress test which reported normal myocardial perfusion with mildly reduced left ventricular systolic function with gated SPECT at 42% and overall high risk study due to low EF.    The nuclear stress test results were reviewed with the patient in great detail at today's office visit.  Clinically patient states that since last office visit she continues to have substernal chest pain and effort related dyspnea.  Patient states that walking in  from the parking lot to the office caused her to be more short of breath compared to while she is at rest.    Patient's mother passed away at the age of 17 secondary congestive heart failure.  Denies prior history of coronary artery disease, myocardial infarction, congestive heart failure, deep venous thrombosis, pulmonary embolism, stroke, transient ischemic attack.  ALLERGIES: Allergies  Allergen Reactions  . Amoxicillin Itching  . Lantus [Insulin Glargine] Itching and Rash   MEDICATION LIST PRIOR TO VISIT: Current Outpatient Medications on File Prior to Visit  Medication Sig Dispense Refill  . acetaminophen (TYLENOL) 500 MG tablet Take 500 mg by mouth every 6 (six) hours as needed.    Marland Kitchen albuterol (PROAIR HFA) 108 (90 Base) MCG/ACT inhaler ProAir HFA 90 mcg/actuation aerosol inhaler  Inhale 2 puffs every 4 hours by inhalation route as needed.    . blood glucose meter kit and supplies KIT Dispense based on patient and insurance preference. Use up to four times daily as directed. (FOR ICD-10 E10.65). 1 each 0  . DULoxetine (CYMBALTA) 60 MG capsule Take 60 mg by mouth daily.    . fluticasone (FLONASE) 50 MCG/ACT nasal spray Flonase 50 mcg/actuation nasal spray,suspension  SPRAY 1 SPRAY INTO EACH NOSTRIL AT BEDTIME    . gabapentin (NEURONTIN) 300 MG capsule Take 300 mg by mouth at bedtime.    Marland Kitchen glucagon 1 MG injection Follow package directions for low blood sugar. 1 each 1  . hydrocortisone 2.5 % cream hydrocortisone 2.5 % topical cream  APPLY A THIN LAYER TO THE AFFECTED AREA(S) BY TOPICAL ROUTE 2 TIMES PER DAY FOR 3 DAYS MAX    . ibuprofen (ADVIL) 800 MG  tablet daily as needed.    . insulin aspart (NOVOLOG FLEXPEN) 100 UNIT/ML FlexPen Inject 9 Units into the skin 3 (three) times daily with meals. (Patient taking differently: Sliding scale) 5 pen 1  . insulin degludec (TRESIBA FLEXTOUCH) 100 UNIT/ML FlexTouch Pen Inject 0.5 mLs (50 Units total) into the skin at bedtime. (Patient taking  differently: Inject 25 Units into the skin 2 (two) times daily. ) 10 pen 1  . Insulin Pen Needle (PEN NEEDLES) 31G X 5 MM MISC 1 each by Does not apply route 6 (six) times daily. 200 each 11  . ketoconazole (NIZORAL) 2 % shampoo daily as needed.    . Lancets (ACCU-CHEK SOFT TOUCH) lancets Check 3-4 times daily for uncontrolled insulin dependent type 1 diabetes 100 each 12  . LORazepam (ATIVAN) 0.5 MG tablet Take 0.5 mg by mouth 3 (three) times daily as needed.    . Magnesium Hydroxide (DULCOLAX PO) Take 2 tablets by mouth daily. Chewable    . norethindrone (AYGESTIN) 5 MG tablet Take 5 mg by mouth daily.    . pantoprazole (PROTONIX) 40 MG tablet Take 40 mg by mouth daily.    . promethazine (PHENERGAN) 25 MG tablet Take 25 mg by mouth every 4 (four) hours as needed for nausea or vomiting.    . Psyllium (METAMUCIL PO) Take 1 packet by mouth daily. Mix with water    . rosuvastatin (CRESTOR) 20 MG tablet Take 1 tablet (20 mg total) by mouth daily. 90 tablet 0  . senna-docusate (SENOKOT-S) 8.6-50 MG tablet Take 1 tablet by mouth 2 (two) times daily between meals as needed for mild constipation. 180 tablet 1   No current facility-administered medications on file prior to visit.    PAST MEDICAL HISTORY: Past Medical History:  Diagnosis Date  . Arthritis   . CHF (congestive heart failure) ([place])   . History of chicken pox   . Migraines   . Mood disorder (HCC)    anxiety  . Prurigo nodularis    with diabetic dermopathy  . Type 1 diabetes, uncontrolled, with neuropathy ([place])    [place]    PAST SURGICAL HISTORY: Past Surgical History:  Procedure Laterality Date  . treadmill stress test  01/2013   WNL, low risk study    FAMILY HISTORY: The patient family history includes Bipolar disorder in her mother; CAD in her maternal aunt; Calcium disorder in her mother; Cancer in her maternal grandmother, maternal uncle, and paternal grandmother; Cancer (age of onset: 43) in her maternal aunt; Diabetes  in her mother, paternal grandfather, paternal grandmother, and sister; Heart disease in her mother; Heart failure in her sister; Hypertension in her mother; Stroke in her maternal aunt; Thyroid disease in her mother.   SOCIAL HISTORY:  The patient  reports that she has never smoked. She has never used smokeless tobacco. She reports current alcohol use. She reports that she does not use drugs.  REVIEW OF SYSTEMS: Review of Systems  Constitution: Negative for chills, fever and malaise/fatigue.  HENT: Negative for ear discharge, ear pain and nosebleeds.   Eyes: Negative for blurred vision and discharge.  Cardiovascular: Positive for chest pain, dyspnea on exertion, leg swelling and orthopnea. Negative for claudication, near-syncope, palpitations, paroxysmal nocturnal dyspnea and syncope.  Respiratory: Positive for shortness of breath. Negative for cough.   Endocrine: Negative for cold intolerance, polydipsia, polyphagia and polyuria.  Hematologic/Lymphatic: Negative for bleeding problem.  Skin: Negative for flushing and nail changes.  Musculoskeletal: Negative for muscle cramps, muscle weakness and  myalgias.  Gastrointestinal: Negative for abdominal pain, dysphagia, heartburn, hematemesis, hematochezia, melena, nausea and vomiting.  Neurological: Positive for paresthesias. Negative for dizziness, focal weakness and light-headedness.   PHYSICAL EXAM: Vitals with BMI 12/30/2019 12/30/2019 12/14/2019  Height - '6\' 0"'  '6\' 0"'   Weight - 173 lbs 156 lbs  BMI - 53.29 92.42  Systolic 683 419 622  Diastolic 98 95 92  Pulse 297 100 102    CONSTITUTIONAL: Appears older than stated age, hemodynamically stable, no acute distress.  SKIN: Skin is warm and dry. No rash noted. No cyanosis. No pallor. No jaundice HEAD: Normocephalic and atraumatic.  EYES: No scleral icterus MOUTH/THROAT: Moist oral membranes.  NECK: No JVD present. No thyromegaly noted. No carotid bruits  LYMPHATIC: No visible cervical  adenopathy.  CHEST Normal respiratory effort. No intercostal retractions  LUNGS: Clear to auscultation bilaterally.  No stridor. No wheezes. No rales.  CARDIOVASCULAR: Tachycardic, positive S1-S2, no murmurs rubs or gallops appreciated most likely secondary to tachycardia. ABDOMINAL: Nonobese, soft, nontender, nondistended, positive bowel sounds in all 4 quadrants, no apparent ascites.  EXTREMITIES: +1 peripheral edema bilaterally, warm to touch bilaterally, old ulcers on the right lower extremity appear to be healed but present. HEMATOLOGIC: No significant bruising NEUROLOGIC: Oriented to person, place, and time. Nonfocal. Normal muscle tone.  PSYCHIATRIC: Normal mood and affect. Normal behavior. Cooperative  CARDIAC DATABASE: EKG: 11/22/2019: Sinus tachycardia, ventricular rate of 110 bpm, normal axis, nonspecific ST depressions and T wave abnormalities.  Echocardiogram: 12/06/2019: LVEF 50-55%, mild LVH, grade 3 diastolic dysfunction, elevated left atrial pressure, severely dilated left atrium, mild MR, mild TR, mild PR.  Stress Testing: 01/2013 At [facility]: Per report myocardial perfusion scan showed no evidence of pathology and has normal appearance.  Exercise myocardial perfusion study with no significant ischemia.  No significant wall motion abnormality noted.  LVEF 61%.  No EKG changes concerning for ischemia.  Lexiscan/modified Bruce Sestamibi stress test 12/20/2019: Stress EKG showed sinus tachycardia, nonspecific ST-T flattening in inferolateral leads.  Normal myocardial perfusion. Mild global reduction in myocardial thickening. Stress LVEF 42%. High risk study due to low LVEF. Recommend clinical correlation.   Heart Catheterization: None  LABORATORY DATA: CBC Latest Ref Rng & Units 10/13/2019 10/12/2019 10/12/2019  WBC 4.0 - 10.5 K/uL 4.7 5.3 5.6  Hemoglobin 12.0 - 15.0 g/dL 9.1(L) 9.9(L) 9.6(L)  Hematocrit 36.0 - 46.0 % 27.2(L) 29.2(L) 28.6(L)  Platelets 150  - 400 K/uL 216 235 243    CMP Latest Ref Rng & Units 12/28/2019 12/10/2019 12/03/2019  Glucose 65 - 99 mg/dL 110(H) 491(H) 475(H)  BUN 6 - 24 mg/dL '18 16 10  ' Creatinine 0.57 - 1.00 mg/dL 0.75 0.78 0.72  Sodium 134 - 144 mmol/L 143 136 134  Potassium 3.5 - 5.2 mmol/L 4.4 4.5 4.7  Chloride 96 - 106 mmol/L 109(H) 100 100  CO2 20 - 29 mmol/L '22 25 24  ' Calcium 8.7 - 10.2 mg/dL 8.5(L) 8.8 8.5(L)  Total Protein 6.0 - 8.5 g/dL - - -  Total Bilirubin 0.0 - 1.2 mg/dL - - -  Alkaline Phos 39 - 117 IU/L - - -  AST 0 - 40 IU/L - - -  ALT 0 - 32 IU/L - - -   BNP (last 3 results) No results for input(s): BNP in the last 8760 hours.  ProBNP (last 3 results) Recent Labs    11/24/19 0934 12/03/19 0951 12/10/19 1116  PROBNP 693* 1,283* 831*    Lipid Panel     Component  Value Date/Time   CHOL 242 (H) 12/03/2019 0952   TRIG 85 12/03/2019 0952   HDL 83 12/03/2019 0952   CHOLHDL 2.9 12/03/2019 0952   CHOLHDL 5.2 10/12/2019 0834   VLDL 16 10/12/2019 0834   LDLCALC 145 (H) 12/03/2019 0952   LDLDIRECT 104.1 04/12/2014 1608   LABVLDL 14 12/03/2019 0952    Lab Results  Component Value Date   HGBA1C 14.4 (H) 10/12/2019   HGBA1C 13.6 (H) 03/30/2019   HGBA1C 10.5 (H) 04/12/2014   No components found for: NTPROBNP Lab Results  Component Value Date   TSH 1.016 10/12/2019   TSH 0.995 04/23/2019   TSH 0.97 09/08/2013   FINAL MEDICATION LIST END OF ENCOUNTER: Meds ordered this encounter  Medications  . sacubitril-valsartan (ENTRESTO) 24-26 MG    Sig: Take 1 tablet by mouth 2 (two) times daily.    Dispense:  60 tablet    Refill:  0  . metoprolol succinate (TOPROL XL) 50 MG 24 hr tablet    Sig: Take 1 tablet (50 mg total) by mouth daily. Hold if systolic blood pressure (top blood pressure number) less than 100 mmHg or heart rate less than 60 bpm (pulse).    Dispense:  30 tablet    Refill:  0  . isosorbide mononitrate (IMDUR) 30 MG 24 hr tablet    Sig: Take 1 tablet (30 mg total) by mouth  daily.    Dispense:  90 tablet    Refill:  0  . furosemide (LASIX) 20 MG tablet    Sig: Take 1 tablet (20 mg total) by mouth daily.    Dispense:  90 tablet    Refill:  0    Medications Discontinued During This Encounter  Medication Reason  . losartan (COZAAR) 25 MG tablet Change in therapy  . carvedilol (COREG) 12.5 MG tablet Change in therapy  . ezetimibe (ZETIA) 10 MG tablet Patient Preference  . furosemide (LASIX) 40 MG tablet Dose change     Current Outpatient Medications:  .  acetaminophen (TYLENOL) 500 MG tablet, Take 500 mg by mouth every 6 (six) hours as needed., Disp: , Rfl:  .  albuterol (PROAIR HFA) 108 (90 Base) MCG/ACT inhaler, ProAir HFA 90 mcg/actuation aerosol inhaler  Inhale 2 puffs every 4 hours by inhalation route as needed., Disp: , Rfl:  .  blood glucose meter kit and supplies KIT, Dispense based on patient and insurance preference. Use up to four times daily as directed. (FOR ICD-10 E10.65)., Disp: 1 each, Rfl: 0 .  DULoxetine (CYMBALTA) 60 MG capsule, Take 60 mg by mouth daily., Disp: , Rfl:  .  fluticasone (FLONASE) 50 MCG/ACT nasal spray, Flonase 50 mcg/actuation nasal spray,suspension  SPRAY 1 SPRAY INTO EACH NOSTRIL AT BEDTIME, Disp: , Rfl:  .  gabapentin (NEURONTIN) 300 MG capsule, Take 300 mg by mouth at bedtime., Disp: , Rfl:  .  glucagon 1 MG injection, Follow package directions for low blood sugar., Disp: 1 each, Rfl: 1 .  hydrocortisone 2.5 % cream, hydrocortisone 2.5 % topical cream  APPLY A THIN LAYER TO THE AFFECTED AREA(S) BY TOPICAL ROUTE 2 TIMES PER DAY FOR 3 DAYS MAX, Disp: , Rfl:  .  ibuprofen (ADVIL) 800 MG tablet, daily as needed., Disp: , Rfl:  .  insulin aspart (NOVOLOG FLEXPEN) 100 UNIT/ML FlexPen, Inject 9 Units into the skin 3 (three) times daily with meals. (Patient taking differently: Sliding scale), Disp: 5 pen, Rfl: 1 .  insulin degludec (TRESIBA FLEXTOUCH) 100 UNIT/ML FlexTouch Pen, Inject  0.5 mLs (50 Units total) into the skin at  bedtime. (Patient taking differently: Inject 25 Units into the skin 2 (two) times daily. ), Disp: 10 pen, Rfl: 1 .  Insulin Pen Needle (PEN NEEDLES) 31G X 5 MM MISC, 1 each by Does not apply route 6 (six) times daily., Disp: 200 each, Rfl: 11 .  ketoconazole (NIZORAL) 2 % shampoo, daily as needed., Disp: , Rfl:  .  Lancets (ACCU-CHEK SOFT TOUCH) lancets, Check 3-4 times daily for uncontrolled insulin dependent type 1 diabetes, Disp: 100 each, Rfl: 12 .  LORazepam (ATIVAN) 0.5 MG tablet, Take 0.5 mg by mouth 3 (three) times daily as needed., Disp: , Rfl:  .  Magnesium Hydroxide (DULCOLAX PO), Take 2 tablets by mouth daily. Chewable, Disp: , Rfl:  .  norethindrone (AYGESTIN) 5 MG tablet, Take 5 mg by mouth daily., Disp: , Rfl:  .  pantoprazole (PROTONIX) 40 MG tablet, Take 40 mg by mouth daily., Disp: , Rfl:  .  promethazine (PHENERGAN) 25 MG tablet, Take 25 mg by mouth every 4 (four) hours as needed for nausea or vomiting., Disp: , Rfl:  .  Psyllium (METAMUCIL PO), Take 1 packet by mouth daily. Mix with water, Disp: , Rfl:  .  rosuvastatin (CRESTOR) 20 MG tablet, Take 1 tablet (20 mg total) by mouth daily., Disp: 90 tablet, Rfl: 0 .  senna-docusate (SENOKOT-S) 8.6-50 MG tablet, Take 1 tablet by mouth 2 (two) times daily between meals as needed for mild constipation., Disp: 180 tablet, Rfl: 1 .  furosemide (LASIX) 20 MG tablet, Take 1 tablet (20 mg total) by mouth daily., Disp: 90 tablet, Rfl: 0 .  isosorbide mononitrate (IMDUR) 30 MG 24 hr tablet, Take 1 tablet (30 mg total) by mouth daily., Disp: 90 tablet, Rfl: 0 .  metoprolol succinate (TOPROL XL) 50 MG 24 hr tablet, Take 1 tablet (50 mg total) by mouth daily. Hold if systolic blood pressure (top blood pressure number) less than 100 mmHg or heart rate less than 60 bpm (pulse)., Disp: 30 tablet, Rfl: 0 .  sacubitril-valsartan (ENTRESTO) 24-26 MG, Take 1 tablet by mouth 2 (two) times daily., Disp: 60 tablet, Rfl: 0  IMPRESSION:    ICD-10-CM   1.  Acute heart failure with preserved ejection fraction (HFpEF) (HCC)  I50.31 sacubitril-valsartan (ENTRESTO) 24-26 MG    metoprolol succinate (TOPROL XL) 50 MG 24 hr tablet    furosemide (LASIX) 20 MG tablet    Basic metabolic panel    Magnesium    Pro b natriuretic peptide (BNP)[facility])    EKG 12-Lead  2. Dyspnea on exertion  R06.00   3. Precordial chest pain  R07.2 metoprolol succinate (TOPROL XL) 50 MG 24 hr tablet    isosorbide mononitrate (IMDUR) 30 MG 24 hr tablet  4. Benign hypertension  I10   5. Mixed hyperlipidemia  E78.2   6. Type 2 diabetes mellitus with hyperglycemia, with long-term current use of insulin (HCC)  E11.65    Z79.4   7. Long-term insulin use (HCC)  Z79.4      RECOMMENDATIONS: [name] is a 49 y.o. female whose past medical history and cardiac risk factors include:  Insulin-dependent diabetes mellitus, history of diabetic ulcers, hypertension, hyperlipidemia, acute on chronic HFpEF.  Precordial chest pain:  Her symptoms of chest discomfort have both typical and atypical features.    Check EKG.  However, given the fact that she is poorly controlled insulin-dependent diabetes mellitus, and multiple cardiovascular risk factors she is recommended  to undergo nuclear stress test at last office visit.  Results were reviewed with her at today's visit.  Myocardial perfusion images are reported to be normal.  However given her continued chest discomfort and poorly controlled insulin-dependent diabetes mellitus cannot rule out balanced ischemia.  Patient was recommended to undergo left heart catheterization with possible intervention to evaluate her symptoms despite uptitrating medical therapy.  After discussing the risks, benefits, and alternatives patient wants to continue with medical therapy at this time.  I will follow her closely next week to reevaluate her symptoms.  Patient will be tentatively placed on the cath schedule for  January 11, 2020.  In the interim, patient understands that if her symptoms increase in intensity, frequency, and/or duration she should seek medical attention at the closest hospital via EMS.  Acute on chronic heart failure with preserved EF:  Since last office visit patient has gained approximately 17 pounds despite increasing her diuretic therapy at last visit.  Will reduce Lasix back to 20 mg p.o. daily and start Entresto 24/26 mg p.o. twice daily.  Discontinue losartan.  We will transition her from carvedilol to metoprolol succinate.  We will add isosorbide mononitrate.  Repeat blood work prior to next office visit.  Reemphasized the importance of a low-salt diet.  Recommend daily weight check, strict I/O's  Fluid restriction to <2L per day, Na restriction < 2g per day  Dyspnea on exertion: See above  Lower extremity swelling: See above  Insulin-dependent diabetes mellitus: Patient understands that her glycemic control is very uncontrolled given her most recent hemoglobin A1c.  She has established with endocrinology since last office visit and started on insulin therapy.   Benign essential hypertension: Not well controlled . Medication reconciled.  . See medication changes above.  . See the medication changes noted above.   . Low salt diet recommended. A diet that is rich in fruits, vegetables, legumes, and low-fat dairy products and low in snacks, sweets, and meats (such as the Dietary Approaches to Stop Hypertension [DASH] diet).   Hyperlipidemia, mixed:   Started on rosuvastatin 20 mg p.o. nightly, on Dec 04, 2019.  We will check a repeat lipid profile in 6 weeks to reevaluate therapy.    Continue Zetia.    Patient does not endorse any evidence of myalgias.    Orders Placed This Encounter  Procedures  . Basic metabolic panel  . Magnesium  . Pro b natriuretic peptide (BNP)[facility] [facility])  . EKG 12-Lead   --Continue cardiac medications as  reconciled in final medication list. --Return in about 1 week (around 01/06/2020) for re-evaluation of symptoms. ekg on arrival . Or sooner if needed. --Continue follow-up with your primary care physician regarding the management of your other chronic comorbid conditions.  Patient's questions and concerns were addressed to her satisfaction. She voices understanding of the instructions provided during this encounter.   This note was created using a voice recognition software as a result there may be grammatical errors inadvertently enclosed that do not reflect the nature of this encounter. Every attempt is made to correct such errors.  Rex Kras, [place], [facility]  Pager: [phone] Office: [phone]

## 2019-12-31 DIAGNOSIS — K047 Periapical abscess without sinus: Secondary | ICD-10-CM | POA: Diagnosis not present

## 2020-01-01 ENCOUNTER — Other Ambulatory Visit: Payer: Self-pay | Admitting: Cardiology

## 2020-01-01 DIAGNOSIS — I5031 Acute diastolic (congestive) heart failure: Secondary | ICD-10-CM

## 2020-01-06 DIAGNOSIS — I5031 Acute diastolic (congestive) heart failure: Secondary | ICD-10-CM | POA: Diagnosis not present

## 2020-01-07 ENCOUNTER — Other Ambulatory Visit: Payer: Self-pay

## 2020-01-07 ENCOUNTER — Ambulatory Visit: Payer: BC Managed Care – PPO | Admitting: Cardiology

## 2020-01-07 ENCOUNTER — Other Ambulatory Visit (HOSPITAL_COMMUNITY)
Admission: RE | Admit: 2020-01-07 | Discharge: 2020-01-07 | Disposition: A | Discharge status: Home / Self Care | Payer: BC Managed Care – PPO | Source: Ambulatory Visit | Attending: Cardiology | Admitting: Cardiology

## 2020-01-07 ENCOUNTER — Encounter: Payer: Self-pay | Admitting: Cardiology

## 2020-01-07 VITALS — BP 122/80 | HR 105 | Resp 16 | Ht 72.0 in | Wt 160.3 lb

## 2020-01-07 DIAGNOSIS — I10 Essential (primary) hypertension: Secondary | ICD-10-CM

## 2020-01-07 DIAGNOSIS — R0609 Other forms of dyspnea: Secondary | ICD-10-CM | POA: Diagnosis not present

## 2020-01-07 DIAGNOSIS — R9439 Abnormal result of other cardiovascular function study: Secondary | ICD-10-CM

## 2020-01-07 DIAGNOSIS — Z01812 Encounter for preprocedural laboratory examination: Secondary | ICD-10-CM | POA: Diagnosis not present

## 2020-01-07 DIAGNOSIS — R072 Precordial pain: Secondary | ICD-10-CM | POA: Diagnosis not present

## 2020-01-07 DIAGNOSIS — I5031 Acute diastolic (congestive) heart failure: Secondary | ICD-10-CM

## 2020-01-07 DIAGNOSIS — F419 Anxiety disorder, unspecified: Secondary | ICD-10-CM | POA: Diagnosis not present

## 2020-01-07 DIAGNOSIS — R600 Localized edema: Secondary | ICD-10-CM

## 2020-01-07 DIAGNOSIS — J309 Allergic rhinitis, unspecified: Secondary | ICD-10-CM | POA: Diagnosis not present

## 2020-01-07 DIAGNOSIS — E1365 Other specified diabetes mellitus with hyperglycemia: Secondary | ICD-10-CM

## 2020-01-07 DIAGNOSIS — Z794 Long term (current) use of insulin: Secondary | ICD-10-CM

## 2020-01-07 DIAGNOSIS — Z20822 Contact with and (suspected) exposure to covid-19: Secondary | ICD-10-CM | POA: Insufficient documentation

## 2020-01-07 DIAGNOSIS — E782 Mixed hyperlipidemia: Secondary | ICD-10-CM

## 2020-01-07 DIAGNOSIS — K219 Gastro-esophageal reflux disease without esophagitis: Secondary | ICD-10-CM

## 2020-01-07 LAB — BASIC METABOLIC PANEL
BUN/Creatinine Ratio: 16 (ref 9–23)
BUN: 13 mg/dL (ref 6–24)
CO2: 24 mmol/L (ref 20–29)
Calcium: 8.8 mg/dL (ref 8.7–10.2)
Chloride: 105 mmol/L (ref 96–106)
Creatinine, Ser: 0.79 mg/dL (ref 0.57–1.00)
GFR calc Af Amer: 102 mL/min/{1.73_m2} (ref >59)
GFR calc non Af Amer: 88 mL/min/{1.73_m2} (ref >59)
Glucose: 97 mg/dL (ref 65–99)
Potassium: 5.2 mmol/L (ref 3.5–5.2)
Sodium: 139 mmol/L (ref 134–144)

## 2020-01-07 LAB — PRO B NATRIURETIC PEPTIDE: NT-Pro BNP: 773 pg/mL — ABNORMAL HIGH (ref 0–249)

## 2020-01-07 LAB — MAGNESIUM: Magnesium: 2.1 mg/dL (ref 1.6–2.3)

## 2020-01-07 MED ORDER — METOPROLOL SUCCINATE ER 100 MG PO TB24: 100.0000 mg | ORAL_TABLET | Freq: Every day | ORAL | 0 refills | Status: DC

## 2020-01-07 NOTE — Progress Notes (Signed)
[name] Date of Birth: 1971/04/06 MRN: [id number] [name], [name], [name] Primary Cardiologist: Rex Kras, DO (established care 11/2019)  Date: 12/30/2019 Last Office Visit: 12/14/2019  Chief Complaint  Patient presents with  . HFpEF    Re-evaluation of symptoms  . Follow-up    1 week   HPI  [name] is a 49 y.o. female who presents to the office with a chief complaint of "reevaluation of chest pain and shortness of breath ." Patient's past medical history and cardiac risk factors include: Insulin-dependent diabetes mellitus, history of diabetic ulcers, hypertension, hyperlipidemia, acute on chronic HFpEF.  Patient was referred to the office at the request of her primary care provider for evaluation and management of congestive heart failure back in April 2021. Since then patient has undergone an echocardiogram and nuclear stress test to evaluate for structural heart disease and reversible ischemia respectively. At last office visit patient was informed that her stress test was reported to be high risk as patient had EKG changes in the inferolateral leads, LVEF was reduced by gated SPECT, and reduction in wall motion thickness. Also at last office visit she had chest pain concerning for anginal equivalent and worsening shortness of breath with effort related activities along with symptoms of orthopnea, paroxysmal nocturnal dyspnea and lower extremity swelling. However, at last visit she refused to undergo invasive angiography after reviewing the risks, benefits and alternatives. She wanted to continue guideline directed medical therapy in the interim if she has worsening symptoms she will present to ER if needed.  Patient's medical therapy was uptitrated and today states that her symptoms are improving but not completely resolved. She does not have any chest pain at rest or with effort related activity. Her shortness of breath has improved  significantly but still noticeable with overexertion. She is able to walk almost 1 mile before becoming short of breath. At the last office visit initiated Entresto, Imdur, uptitrated Toprol-XL. She tolerated the medication changes well without any side effects or intolerances.  Patient's mother passed away at the age of 64 secondary congestive heart failure.  Denies prior history of coronary artery disease, myocardial infarction, congestive heart failure, deep venous thrombosis, pulmonary embolism, stroke, transient ischemic attack.  ALLERGIES: Allergies  Allergen Reactions  . Amoxicillin Itching  . Lantus [Insulin Glargine] Itching and Rash   MEDICATION LIST PRIOR TO VISIT: Current Outpatient Medications on File Prior to Visit  Medication Sig Dispense Refill  . acetaminophen (TYLENOL) 500 MG tablet Take 500-1,000 mg by mouth every 6 (six) hours as needed for mild pain or headache.     . albuterol (PROAIR HFA) 108 (90 Base) MCG/ACT inhaler Inhale 2 puffs into the lungs every 4 (four) hours as needed for wheezing or shortness of breath.     . blood glucose meter kit and supplies KIT Dispense based on patient and insurance preference. Use up to four times daily as directed. (FOR ICD-10 E10.65). 1 each 0  . clindamycin (CLEOCIN) 150 MG capsule Take 150 mg by mouth 4 (four) times daily.    . DULoxetine (CYMBALTA) 60 MG capsule Take 60 mg by mouth daily.    . fluticasone (FLONASE) 50 MCG/ACT nasal spray Place into both nostrils daily as needed for allergies.     Marland Kitchen gabapentin (NEURONTIN) 300 MG capsule Take 300 mg by mouth at bedtime.    Marland Kitchen glucagon 1 MG injection Follow package directions for low blood sugar. (Patient taking differently: Inject 1 mg into the muscle once as needed (  Low blood glucose). Follow package directions for low blood sugar.) 1 each 1  . hydrocortisone 2.5 % cream Apply 1 application topically daily as needed (ulcers on legs).     . insulin aspart (NOVOLOG FLEXPEN) 100  UNIT/ML FlexPen Inject 9 Units into the skin 3 (three) times daily with meals. (Patient taking differently: Inject 8 Units into the skin 3 (three) times daily with meals. Sliding scale) 5 pen 1  . insulin degludec (TRESIBA FLEXTOUCH) 100 UNIT/ML FlexTouch Pen Inject 0.5 mLs (50 Units total) into the skin at bedtime. (Patient taking differently: Inject 66 Units into the skin daily. ) 10 pen 1  . Insulin Pen Needle (PEN NEEDLES) 31G X 5 MM MISC 1 each by Does not apply route 6 (six) times daily. 200 each 11  . isosorbide mononitrate (IMDUR) 30 MG 24 hr tablet Take 1 tablet (30 mg total) by mouth daily. 90 tablet 0  . ketoconazole (NIZORAL) 2 % shampoo Apply 1 application topically daily as needed for irritation.     . Lancets (ACCU-CHEK SOFT TOUCH) lancets Check 3-4 times daily for uncontrolled insulin dependent type 1 diabetes 100 each 12  . LORazepam (ATIVAN) 0.5 MG tablet Take 0.5 mg by mouth 3 (three) times daily as needed for anxiety.     . Magnesium Hydroxide (DULCOLAX PO) Take 2 tablets by mouth daily as needed (constipation). Chewable     . norethindrone (AYGESTIN) 5 MG tablet Take 5 mg by mouth daily.    Marland Kitchen olopatadine (PATADAY) 0.1 % ophthalmic solution Place 1 drop into both eyes daily as needed for allergies.    . pantoprazole (PROTONIX) 40 MG tablet Take 40 mg by mouth daily.    . promethazine (PHENERGAN) 25 MG tablet Take 25 mg by mouth every 4 (four) hours as needed for nausea or vomiting.    . Psyllium (METAMUCIL PO) Take 1 packet by mouth daily as needed (Constipation). Mix with water     . rosuvastatin (CRESTOR) 20 MG tablet Take 1 tablet (20 mg total) by mouth daily. 90 tablet 0  . sacubitril-valsartan (ENTRESTO) 24-26 MG Take 1 tablet by mouth 2 (two) times daily. 60 tablet 0  . senna-docusate (SENOKOT-S) 8.6-50 MG tablet Take 1 tablet by mouth 2 (two) times daily between meals as needed for mild constipation. 180 tablet 1   No current facility-administered medications on file prior  to visit.    PAST MEDICAL HISTORY: Past Medical History:  Diagnosis Date  . Arthritis   . CHF (congestive heart failure) ([place])   . History of chicken pox   . Migraines   . Mood disorder (HCC)    anxiety  . Prurigo nodularis    with diabetic dermopathy  . Type 1 diabetes, uncontrolled, with neuropathy ([place])    [place]    PAST SURGICAL HISTORY: Past Surgical History:  Procedure Laterality Date  . treadmill stress test  01/2013   WNL, low risk study    FAMILY HISTORY: The patient family history includes Bipolar disorder in her mother; CAD in her maternal aunt; Calcium disorder in her mother; Cancer in her maternal grandmother, maternal uncle, and paternal grandmother; Cancer (age of onset: 29) in her maternal aunt; Diabetes in her mother, paternal grandfather, paternal grandmother, and sister; Heart disease in her mother; Heart failure in her sister; Hypertension in her mother; Stroke in her maternal aunt; Thyroid disease in her mother.   SOCIAL HISTORY:  The patient  reports that she has never smoked. She has never used smokeless tobacco.  She reports previous alcohol use. She reports that she does not use drugs.  REVIEW OF SYSTEMS: Review of Systems  Constitution: Negative for chills, fever and malaise/fatigue.  HENT: Negative for ear discharge, ear pain and nosebleeds.   Eyes: Negative for blurred vision and discharge.  Cardiovascular: Positive for dyspnea on exertion and leg swelling. Negative for chest pain, claudication, near-syncope, orthopnea, palpitations, paroxysmal nocturnal dyspnea and syncope.  Respiratory: Positive for shortness of breath. Negative for cough.   Endocrine: Negative for cold intolerance, polydipsia, polyphagia and polyuria.  Hematologic/Lymphatic: Negative for bleeding problem.  Skin: Negative for flushing and nail changes.  Musculoskeletal: Negative for muscle cramps, muscle weakness and myalgias.  Gastrointestinal: Negative for abdominal pain,  dysphagia, heartburn, hematemesis, hematochezia, melena, nausea and vomiting.  Neurological: Positive for paresthesias. Negative for dizziness, focal weakness and light-headedness.   PHYSICAL EXAM: Vitals with BMI 01/07/2020 12/30/2019 12/30/2019  Height '6\' 0"'  - '6\' 0"'   Weight 160 lbs 5 oz - 173 lbs  BMI 93.79 - 02.40  Systolic 973 532 992  Diastolic 80 98 95  Pulse 426 100 100    CONSTITUTIONAL: Appears older than stated age, hemodynamically stable, no acute distress.  SKIN: Skin is warm and dry. No rash noted. No cyanosis. No pallor. No jaundice HEAD: Normocephalic and atraumatic.  EYES: No scleral icterus MOUTH/THROAT: Moist oral membranes.  NECK: No JVD present. No thyromegaly noted. No carotid bruits  LYMPHATIC: No visible cervical adenopathy.  CHEST Normal respiratory effort. No intercostal retractions  LUNGS: Clear to auscultation bilaterally.  No stridor. No wheezes. No rales.  CARDIOVASCULAR: Tachycardic, positive S1-S2, no murmurs rubs or gallops appreciated most likely secondary to tachycardia. ABDOMINAL: Nonobese, soft, nontender, nondistended, positive bowel sounds in all 4 quadrants, no apparent ascites.  EXTREMITIES: +1 peripheral edema bilaterally, warm to touch bilaterally, old ulcers on the right lower extremity appear to be healed but present. HEMATOLOGIC: No significant bruising NEUROLOGIC: Oriented to person, place, and time. Nonfocal. Normal muscle tone.  PSYCHIATRIC: Normal mood and affect. Normal behavior. Cooperative  CARDIAC DATABASE: EKG: 11/22/2019: Sinus tachycardia, ventricular rate of 110 bpm, normal axis, nonspecific ST depressions and T wave abnormalities.  Echocardiogram: 12/06/2019: LVEF 50-55%, mild LVH, grade 3 diastolic dysfunction, elevated left atrial pressure, severely dilated left atrium, mild MR, mild TR, mild PR.  Stress Testing: Lexiscan/modified Bruce Sestamibi stress test 12/20/2019: Stress EKG showed sinus tachycardia, nonspecific ST-T  flattening in inferolateral leads. Normal myocardial perfusion. Mild global reduction in myocardial thickening. Stress LVEF 42%. High risk study due to low LVEF. Recommend clinical correlation.   Heart Catheterization: None  LABORATORY DATA: CBC Latest Ref Rng & Units 10/13/2019 10/12/2019 10/12/2019  WBC 4.0 - 10.5 K/uL 4.7 5.3 5.6  Hemoglobin 12.0 - 15.0 g/dL 9.1(L) 9.9(L) 9.6(L)  Hematocrit 36.0 - 46.0 % 27.2(L) 29.2(L) 28.6(L)  Platelets 150 - 400 K/uL 216 235 243    CMP Latest Ref Rng & Units 01/06/2020 12/28/2019 12/10/2019  Glucose 65 - 99 mg/dL 97 110(H) 491(H)  BUN 6 - 24 mg/dL '13 18 16  ' Creatinine 0.57 - 1.00 mg/dL 0.79 0.75 0.78  Sodium 134 - 144 mmol/L 139 143 136  Potassium 3.5 - 5.2 mmol/L 5.2 4.4 4.5  Chloride 96 - 106 mmol/L 105 109(H) 100  CO2 20 - 29 mmol/L '24 22 25  ' Calcium 8.7 - 10.2 mg/dL 8.8 8.5(L) 8.8  Total Protein 6.0 - 8.5 g/dL - - -  Total Bilirubin 0.0 - 1.2 mg/dL - - -  Alkaline Phos 39 - 117 IU/L - - -  AST 0 - 40 IU/L - - -  ALT 0 - 32 IU/L - - -   BNP (last 3 results) No results for input(s): BNP in the last 8760 hours.  ProBNP (last 3 results) Recent Labs    12/03/19 0951 12/10/19 1116 01/06/20 0954  PROBNP 1,283* 831* 773*    Lipid Panel     Component Value Date/Time   CHOL 242 (H) 12/03/2019 0952   TRIG 85 12/03/2019 0952   HDL 83 12/03/2019 0952   CHOLHDL 2.9 12/03/2019 0952   CHOLHDL 5.2 10/12/2019 0834   VLDL 16 10/12/2019 0834   LDLCALC 145 (H) 12/03/2019 0952   LDLDIRECT 104.1 04/12/2014 1608   LABVLDL 14 12/03/2019 0952    Lab Results  Component Value Date   HGBA1C 14.4 (H) 10/12/2019   HGBA1C 13.6 (H) 03/30/2019   HGBA1C 10.5 (H) 04/12/2014   No components found for: NTPROBNP Lab Results  Component Value Date   TSH 1.016 10/12/2019   TSH 0.995 04/23/2019   TSH 0.97 09/08/2013   FINAL MEDICATION LIST END OF ENCOUNTER: Meds ordered this encounter  Medications  . metoprolol succinate (TOPROL-XL) 100 MG 24 hr tablet     Sig: Take 1 tablet (100 mg total) by mouth daily. Take with or immediately following a meal. Hold if systolic blood pressure (top blood pressure number) less than 100 mmHg or heart rate less than 60 bpm (pulse).    Dispense:  90 tablet    Refill:  0    Medications Discontinued During This Encounter  Medication Reason  . furosemide (LASIX) 20 MG tablet Discontinued by provider  . ibuprofen (ADVIL) 800 MG tablet Discontinued by provider  . traMADol (ULTRAM) 50 MG tablet Patient Preference  . metoprolol succinate (TOPROL XL) 50 MG 24 hr tablet Dose change     Current Outpatient Medications:  .  acetaminophen (TYLENOL) 500 MG tablet, Take 500-1,000 mg by mouth every 6 (six) hours as needed for mild pain or headache. , Disp: , Rfl:  .  albuterol (PROAIR HFA) 108 (90 Base) MCG/ACT inhaler, Inhale 2 puffs into the lungs every 4 (four) hours as needed for wheezing or shortness of breath. , Disp: , Rfl:  .  blood glucose meter kit and supplies KIT, Dispense based on patient and insurance preference. Use up to four times daily as directed. (FOR ICD-10 E10.65)., Disp: 1 each, Rfl: 0 .  clindamycin (CLEOCIN) 150 MG capsule, Take 150 mg by mouth 4 (four) times daily., Disp: , Rfl:  .  DULoxetine (CYMBALTA) 60 MG capsule, Take 60 mg by mouth daily., Disp: , Rfl:  .  fluticasone (FLONASE) 50 MCG/ACT nasal spray, Place into both nostrils daily as needed for allergies. , Disp: , Rfl:  .  gabapentin (NEURONTIN) 300 MG capsule, Take 300 mg by mouth at bedtime., Disp: , Rfl:  .  glucagon 1 MG injection, Follow package directions for low blood sugar. (Patient taking differently: Inject 1 mg into the muscle once as needed (Low blood glucose). Follow package directions for low blood sugar.), Disp: 1 each, Rfl: 1 .  hydrocortisone 2.5 % cream, Apply 1 application topically daily as needed (ulcers on legs). , Disp: , Rfl:  .  insulin aspart (NOVOLOG FLEXPEN) 100 UNIT/ML FlexPen, Inject 9 Units into the skin 3 (three)  times daily with meals. (Patient taking differently: Inject 8 Units into the skin 3 (three) times daily with meals. Sliding scale), Disp: 5 pen, Rfl: 1 .  insulin degludec (TRESIBA FLEXTOUCH) 100 UNIT/ML  FlexTouch Pen, Inject 0.5 mLs (50 Units total) into the skin at bedtime. (Patient taking differently: Inject 66 Units into the skin daily. ), Disp: 10 pen, Rfl: 1 .  Insulin Pen Needle (PEN NEEDLES) 31G X 5 MM MISC, 1 each by Does not apply route 6 (six) times daily., Disp: 200 each, Rfl: 11 .  isosorbide mononitrate (IMDUR) 30 MG 24 hr tablet, Take 1 tablet (30 mg total) by mouth daily., Disp: 90 tablet, Rfl: 0 .  ketoconazole (NIZORAL) 2 % shampoo, Apply 1 application topically daily as needed for irritation. , Disp: , Rfl:  .  Lancets (ACCU-CHEK SOFT TOUCH) lancets, Check 3-4 times daily for uncontrolled insulin dependent type 1 diabetes, Disp: 100 each, Rfl: 12 .  LORazepam (ATIVAN) 0.5 MG tablet, Take 0.5 mg by mouth 3 (three) times daily as needed for anxiety. , Disp: , Rfl:  .  Magnesium Hydroxide (DULCOLAX PO), Take 2 tablets by mouth daily as needed (constipation). Chewable , Disp: , Rfl:  .  norethindrone (AYGESTIN) 5 MG tablet, Take 5 mg by mouth daily., Disp: , Rfl:  .  olopatadine (PATADAY) 0.1 % ophthalmic solution, Place 1 drop into both eyes daily as needed for allergies., Disp: , Rfl:  .  pantoprazole (PROTONIX) 40 MG tablet, Take 40 mg by mouth daily., Disp: , Rfl:  .  promethazine (PHENERGAN) 25 MG tablet, Take 25 mg by mouth every 4 (four) hours as needed for nausea or vomiting., Disp: , Rfl:  .  Psyllium (METAMUCIL PO), Take 1 packet by mouth daily as needed (Constipation). Mix with water , Disp: , Rfl:  .  rosuvastatin (CRESTOR) 20 MG tablet, Take 1 tablet (20 mg total) by mouth daily., Disp: 90 tablet, Rfl: 0 .  sacubitril-valsartan (ENTRESTO) 24-26 MG, Take 1 tablet by mouth 2 (two) times daily., Disp: 60 tablet, Rfl: 0 .  senna-docusate (SENOKOT-S) 8.6-50 MG tablet, Take 1  tablet by mouth 2 (two) times daily between meals as needed for mild constipation., Disp: 180 tablet, Rfl: 1 .  metoprolol succinate (TOPROL-XL) 100 MG 24 hr tablet, Take 1 tablet (100 mg total) by mouth daily. Take with or immediately following a meal. Hold if systolic blood pressure (top blood pressure number) less than 100 mmHg or heart rate less than 60 bpm (pulse)., Disp: 90 tablet, Rfl: 0  IMPRESSION:    ICD-10-CM   1. Acute heart failure with preserved ejection fraction (HFpEF) (HCC)  I50.31 EKG 94-[id number]    Basic metabolic panel    Magnesium    Pro b natriuretic peptide (BNP)[facility])  2. Dyspnea on exertion  R06.00 metoprolol succinate (TOPROL-XL) 100 MG 24 hr tablet    CT CORONARY MORPH W/CTA COR W/SCORE W/CA W/CM &/OR WO/CM    CT CORONARY FRACTIONAL FLOW RESERVE DATA PREP    CT CORONARY FRACTIONAL FLOW RESERVE FLUID ANALYSIS  3. Precordial chest pain  R07.2   4. Benign hypertension  I10   5. Mixed hyperlipidemia  E78.2 Lipid Panel With LDL/HDL Ratio  6. Type 2 diabetes mellitus with hyperglycemia, with long-term current use of insulin (HCC)  E11.65    Z79.4   7. Long-term insulin use (HCC)  Z79.4   8. Leg edema  R60.0   9. Other specified diabetes mellitus with hyperglycemia, with long-term current use of insulin (HCC)  E13.65    Z79.4   10. Gastroesophageal reflux disease, unspecified whether esophagitis present  K21.9   11. Abnormal stress test  R94.39 metoprolol succinate (TOPROL-XL) 100 MG 24  hr tablet    CT CORONARY MORPH W/CTA COR W/SCORE W/CA W/CM &/OR WO/CM    CT CORONARY FRACTIONAL FLOW RESERVE DATA PREP    CT CORONARY FRACTIONAL FLOW RESERVE FLUID ANALYSIS     RECOMMENDATIONS: [name] is a 49 y.o. female whose past medical history and cardiac risk factors include:  Insulin-dependent diabetes mellitus, history of diabetic ulcers, hypertension, hyperlipidemia, acute on chronic HFpEF.  Abnormal nuclear stress test:  Since  last office visit patient does not have any anginal chest pain but her shortness of breath with effort related activities could be her anginal equivalent.  Because her symptoms have improved since up titration of medical therapy she refuses to undergo left heart catheterization with possible intervention. Elective catheterization will be canceled at her request.  However, patient is educated that she has multiple cardiovascular risk factors and due to continued effort related dyspnea may consider coronary CTA to evaluate for obstructive CAD. Patient is agreeable.  We will increase her Toprol-XL to 100 mg p.o. daily. Patient is asked to take an extra dose of Toprol-XL 2 hours prior to her procedure.   Acute on chronic heart failure with preserved EF: Improving  Since last office visit patient has lost 13 pounds.   Continue Entresto 24/26 mg p.o. twice daily.  Uptitrate Toprol-XL 200 mg p.o. every morning  Continue isosorbide mononitrate.  At the next office visit may consider initiation of ivabradine.  Reemphasized the importance of a low-salt diet.  Recommend daily weight check, strict I/O's  Fluid restriction to <2L per day, Na restriction < 2g per day  Dyspnea on exertion: See above  Lower extremity swelling: See above  Insulin-dependent diabetes mellitus: Patient understands that her glycemic control is very uncontrolled given her most recent hemoglobin A1c.  She has established with endocrinology since last office visit and started on insulin therapy.   Benign essential hypertension: Not well controlled . Medication reconciled.  . See medication changes above.  . See the medication changes noted above.   . Low salt diet recommended. A diet that is rich in fruits, vegetables, legumes, and low-fat dairy products and low in snacks, sweets, and meats (such as the Dietary Approaches to Stop Hypertension [DASH] diet).   Hyperlipidemia, mixed:   Started on rosuvastatin 20 mg  p.o. nightly, on Dec 04, 2019.  We will check a repeat lipid profile in 6 weeks to reevaluate therapy.    Continue Zetia.    Patient does not endorse any evidence of myalgias.    Orders Placed This Encounter  Procedures  . CT CORONARY MORPH W/CTA COR W/SCORE W/CA W/CM &/OR WO/CM  . CT CORONARY FRACTIONAL FLOW RESERVE DATA PREP  . CT CORONARY FRACTIONAL FLOW RESERVE FLUID ANALYSIS  . Basic metabolic panel  . Magnesium  . Pro b natriuretic peptide (BNP)[facility] [facility])  . Lipid Panel With LDL/HDL Ratio  . EKG 12-Lead   --Continue cardiac medications as reconciled in final medication list. --Return in about 4 weeks (around 02/04/2020) for re-evaluation of symptoms., heart failure management., review test results.. Or sooner if needed. --Continue follow-up with your primary care physician regarding the management of your other chronic comorbid conditions.  Patient's questions and concerns were addressed to her satisfaction. She voices understanding of the instructions provided during this encounter.   This note was created using a voice recognition software as a result there may be grammatical errors inadvertently enclosed that do not reflect the nature of this encounter. Every attempt is made to correct  such errors.  Rex Kras, [place], [facility]  Pager: [phone] Office: [phone]

## 2020-01-08 LAB — SARS CORONAVIRUS 2 (TAT 6-24 HRS): SARS Coronavirus 2: NEGATIVE

## 2020-01-11 ENCOUNTER — Other Ambulatory Visit: Payer: Self-pay | Admitting: Cardiology

## 2020-01-11 ENCOUNTER — Telehealth (HOSPITAL_COMMUNITY): Payer: Self-pay | Admitting: *Deleted

## 2020-01-11 ENCOUNTER — Encounter (HOSPITAL_COMMUNITY): Admission: RE | Payer: Self-pay | Source: Home / Self Care

## 2020-01-11 ENCOUNTER — Ambulatory Visit (HOSPITAL_COMMUNITY): Admission: RE | Admit: 2020-01-11 | Payer: BC Managed Care – PPO | Source: Home / Self Care | Admitting: Cardiology

## 2020-01-11 ENCOUNTER — Telehealth: Payer: Self-pay | Admitting: General Practice

## 2020-01-11 DIAGNOSIS — I10 Essential (primary) hypertension: Secondary | ICD-10-CM

## 2020-01-11 DIAGNOSIS — E1365 Other specified diabetes mellitus with hyperglycemia: Secondary | ICD-10-CM

## 2020-01-11 DIAGNOSIS — R0609 Other forms of dyspnea: Secondary | ICD-10-CM

## 2020-01-11 SURGERY — LEFT HEART CATH AND CORONARY ANGIOGRAPHY
Anesthesia: LOCAL

## 2020-01-11 NOTE — Telephone Encounter (Signed)
Reaching out to patient to offer assistance regarding upcoming cardiac imaging study; pt verbalizes understanding of appt date/time, parking situation and where to check in, pre-test NPO status and medications ordered, and verified current allergies; name and call back number provided for further questions should they arise  Tijana Walder Tai RN Navigator Cardiac Imaging [facility] [phone] office [phone] cell 

## 2020-01-11 NOTE — Telephone Encounter (Signed)
[name] is calling stating she is returning a call from yesterday to schedule her MRI at [facility]. I was unable to find documentation of who called [facility] yesterday or an active request for an MRI to be scheduled. Please advise.

## 2020-01-12 ENCOUNTER — Encounter: Payer: Self-pay | Admitting: Cardiology

## 2020-01-12 ENCOUNTER — Ambulatory Visit (HOSPITAL_COMMUNITY)
Admission: RE | Admit: 2020-01-12 | Discharge: 2020-01-12 | Disposition: A | Discharge status: Home / Self Care | Payer: BC Managed Care – PPO | Source: Ambulatory Visit | Attending: Cardiology | Admitting: Cardiology

## 2020-01-12 ENCOUNTER — Encounter (HOSPITAL_COMMUNITY): Payer: Self-pay

## 2020-01-12 ENCOUNTER — Other Ambulatory Visit: Payer: Self-pay

## 2020-01-12 ENCOUNTER — Ambulatory Visit: Payer: BC Managed Care – PPO | Admitting: Cardiology

## 2020-01-12 VITALS — BP 114/62 | HR 88 | Wt 163.0 lb

## 2020-01-12 DIAGNOSIS — R0600 Dyspnea, unspecified: Secondary | ICD-10-CM | POA: Insufficient documentation

## 2020-01-12 DIAGNOSIS — E1365 Other specified diabetes mellitus with hyperglycemia: Secondary | ICD-10-CM

## 2020-01-12 DIAGNOSIS — R0609 Other forms of dyspnea: Secondary | ICD-10-CM

## 2020-01-12 DIAGNOSIS — R072 Precordial pain: Secondary | ICD-10-CM

## 2020-01-12 DIAGNOSIS — E1165 Type 2 diabetes mellitus with hyperglycemia: Secondary | ICD-10-CM

## 2020-01-12 DIAGNOSIS — E782 Mixed hyperlipidemia: Secondary | ICD-10-CM

## 2020-01-12 DIAGNOSIS — Z794 Long term (current) use of insulin: Secondary | ICD-10-CM

## 2020-01-12 DIAGNOSIS — I10 Essential (primary) hypertension: Secondary | ICD-10-CM

## 2020-01-12 DIAGNOSIS — R9439 Abnormal result of other cardiovascular function study: Secondary | ICD-10-CM | POA: Insufficient documentation

## 2020-01-12 DIAGNOSIS — I5031 Acute diastolic (congestive) heart failure: Secondary | ICD-10-CM

## 2020-01-12 DIAGNOSIS — R600 Localized edema: Secondary | ICD-10-CM

## 2020-01-12 DIAGNOSIS — I959 Hypotension, unspecified: Secondary | ICD-10-CM

## 2020-01-12 LAB — GLUCOSE, CAPILLARY
Glucose-Capillary: 101 mg/dL — ABNORMAL HIGH (ref 70–99)
Glucose-Capillary: 75 mg/dL (ref 70–99)

## 2020-01-12 MED ORDER — IVABRADINE HCL 5 MG PO TABS: 5.0000 mg | ORAL_TABLET | Freq: Two times a day (BID) | ORAL | 0 refills | Status: AC

## 2020-01-12 NOTE — Progress Notes (Signed)
Rechecked VS post snack and Coke. BP 110/60 and heart rate 81. CBG is 101. Patient feels much better and less dizzy. Discharged w/c. Reminded to call her cardiologist to let him know how she was feeling overnight and this am.

## 2020-01-12 NOTE — Progress Notes (Addendum)
[name] Date of Birth: 10/13/1970 MRN: [id number] [place], [name], [place] Primary Cardiologist: Rex Kras, DO (established care 11/2019)  Date: 01/12/2020 Last Office Visit: 01/07/2020  Chief Complaint  Patient presents with  . Congestive Heart Failure  . Follow-up     low bp, elevated heart rate, discuss medications   HPI  [name] is a 49 y.o. female who presents to the office with a chief complaint of "low blood pressure and elevated heart rate" Patient's past medical history and cardiac risk factors include: Insulin-dependent diabetes mellitus, history of diabetic ulcers, hypertension, hyperlipidemia, acute on chronic HFpEF.  Patient was referred to the office at the request of her primary care provider for evaluation and management of congestive heart failure back in April 2021.  Since then her medical therapy has been uptitrated and her symptoms of chest pain or shortness of breath have improved.  Given her multiple cardiovascular risk factors and continued intermittent chest pain and effort related dyspnea patient was recommended to undergo coronary CTA to evaluate for obstructive CAD.  Patient had her coronary CTA scheduled for today and when she went in to have the test done she was found to be hypotensive and tachycardic.  She also did not feel well and has intermittent episodes of hypoglycemia and diaphoresis.  This morning her blood sugar was noted to be 75 and at today's office visit was 68.  This is in comparison to her March blood sugars in the sooner than 600.    Patient's mother passed away at the age of 22 secondary congestive heart failure.  Denies prior history of coronary artery disease, myocardial infarction, congestive heart failure, deep venous thrombosis, pulmonary embolism, stroke, transient ischemic attack.  ALLERGIES: Allergies  Allergen Reactions  . Amoxicillin Itching  . Lantus [Insulin Glargine] Itching and  Rash   MEDICATION LIST PRIOR TO VISIT: Current Outpatient Medications on File Prior to Visit  Medication Sig Dispense Refill  . acetaminophen (TYLENOL) 500 MG tablet Take 500-1,000 mg by mouth every 6 (six) hours as needed for mild pain or headache.     . albuterol (PROAIR HFA) 108 (90 Base) MCG/ACT inhaler Inhale 2 puffs into the lungs every 4 (four) hours as needed for wheezing or shortness of breath.     . blood glucose meter kit and supplies KIT Dispense based on patient and insurance preference. Use up to four times daily as directed. (FOR ICD-10 E10.65). 1 each 0  . DULoxetine (CYMBALTA) 60 MG capsule Take 60 mg by mouth daily.    . fluticasone (FLONASE) 50 MCG/ACT nasal spray Place into both nostrils daily as needed for allergies.     Marland Kitchen gabapentin (NEURONTIN) 300 MG capsule Take 300 mg by mouth at bedtime.    Marland Kitchen glucagon 1 MG injection Follow package directions for low blood sugar. (Patient taking differently: Inject 1 mg into the muscle once as needed (Low blood glucose). Follow package directions for low blood sugar.) 1 each 1  . hydrocortisone 2.5 % cream Apply 1 application topically daily as needed (ulcers on legs).     . insulin aspart (NOVOLOG FLEXPEN) 100 UNIT/ML FlexPen Inject 9 Units into the skin 3 (three) times daily with meals. (Patient taking differently: Inject 8 Units into the skin 3 (three) times daily with meals. Sliding scale) 5 pen 1  . insulin degludec (TRESIBA FLEXTOUCH) 100 UNIT/ML FlexTouch Pen Inject 0.5 mLs (50 Units total) into the skin at bedtime. (Patient taking differently: Inject 66 Units into the skin daily. )  10 pen 1  . Insulin Pen Needle (PEN NEEDLES) 31G X 5 MM MISC 1 each by Does not apply route 6 (six) times daily. 200 each 11  . ketoconazole (NIZORAL) 2 % shampoo Apply 1 application topically daily as needed for irritation.     . Lancets (ACCU-CHEK SOFT TOUCH) lancets Check 3-4 times daily for uncontrolled insulin dependent type 1 diabetes 100 each 12  .  LORazepam (ATIVAN) 0.5 MG tablet Take 0.5 mg by mouth 3 (three) times daily as needed for anxiety.     . Magnesium Hydroxide (DULCOLAX PO) Take 2 tablets by mouth daily as needed (constipation). Chewable     . olopatadine (PATADAY) 0.1 % ophthalmic solution Place 1 drop into both eyes daily as needed for allergies.    . pantoprazole (PROTONIX) 40 MG tablet Take 40 mg by mouth daily.    . promethazine (PHENERGAN) 25 MG tablet Take 25 mg by mouth every 4 (four) hours as needed for nausea or vomiting.    . Psyllium (METAMUCIL PO) Take 1 packet by mouth daily as needed (Constipation). Mix with water     . rosuvastatin (CRESTOR) 20 MG tablet Take 1 tablet (20 mg total) by mouth daily. 90 tablet 0  . sacubitril-valsartan (ENTRESTO) 49-51 MG Take 1 tablet by mouth 2 (two) times daily.    Marland Kitchen senna-docusate (SENOKOT-S) 8.6-50 MG tablet Take 1 tablet by mouth 2 (two) times daily between meals as needed for mild constipation. 180 tablet 1   No current facility-administered medications on file prior to visit.    PAST MEDICAL HISTORY: Past Medical History:  Diagnosis Date  . Arthritis   . CHF (congestive heart failure) ([facility])   . History of chicken pox   . Migraines   . Mood disorder (H[facility])    anxiety  . Prurigo nodularis    with diabetic dermopathy  . Type 1 diabetes, uncontrolled, with neuropathy ([facility])    [place]    PAST SURGICAL HISTORY: Past Surgical History:  Procedure Laterality Date  . treadmill stress test  01/2013   WNL, low risk study    FAMILY HISTORY: The patient family history includes Bipolar disorder in her mother; CAD in her maternal aunt; Calcium disorder in her mother; Cancer in her maternal grandmother, maternal uncle, and paternal grandmother; Cancer (age of onset: 81) in her maternal aunt; Diabetes in her mother, paternal grandfather, paternal grandmother, and sister; Heart disease in her mother; Heart failure in her sister; Hypertension in her mother; Stroke in her maternal  aunt; Thyroid disease in her mother.   SOCIAL HISTORY:  The patient  reports that she has never smoked. She has never used smokeless tobacco. She reports previous alcohol use. She reports that she does not use drugs.  REVIEW OF SYSTEMS: Review of Systems  Constitution: Negative for chills, fever and malaise/fatigue.  HENT: Negative for ear discharge, ear pain and nosebleeds.   Eyes: Negative for blurred vision and discharge.  Cardiovascular: Positive for dyspnea on exertion and leg swelling. Negative for chest pain, claudication, near-syncope, orthopnea, palpitations, paroxysmal nocturnal dyspnea and syncope.  Respiratory: Positive for shortness of breath. Negative for cough.   Endocrine: Positive for heat intolerance. Negative for cold intolerance, polydipsia, polyphagia and polyuria.  Hematologic/Lymphatic: Negative for bleeding problem.  Skin: Negative for flushing and nail changes.  Musculoskeletal: Negative for muscle cramps, muscle weakness and myalgias.  Gastrointestinal: Negative for abdominal pain, dysphagia, heartburn, hematemesis, hematochezia, melena, nausea and vomiting.  Neurological: Positive for dizziness, light-headedness and paresthesias. Negative for  focal weakness.   PHYSICAL EXAM: Today's Vitals   01/12/20 1434  BP: 114/62  Pulse: 88  SpO2: 99%  Weight: 163 lb (73.9 kg)   Body mass index is 22.11 kg/m.  CONSTITUTIONAL: Appears older than stated age, hemodynamically stable, no acute distress.  SKIN: Skin is warm and dry. No rash noted. No cyanosis. No pallor. No jaundice HEAD: Normocephalic and atraumatic.  EYES: No scleral icterus MOUTH/THROAT: Moist oral membranes.  NECK: No JVD present. No thyromegaly noted. No carotid bruits  LYMPHATIC: No visible cervical adenopathy.  CHEST Normal respiratory effort. No intercostal retractions  LUNGS: Clear to auscultation bilaterally.  No stridor. No wheezes. No rales.  CARDIOVASCULAR: Tachycardic, positive S1-S2, no  murmurs rubs or gallops appreciated most likely secondary to tachycardia. ABDOMINAL: Nonobese, soft, nontender, nondistended, positive bowel sounds in all 4 quadrants, no apparent ascites.  EXTREMITIES: +1 peripheral edema bilaterally, warm to touch bilaterally, old ulcers on the right lower extremity appear to be healed but present. HEMATOLOGIC: No significant bruising NEUROLOGIC: Oriented to person, place, and time. Nonfocal. Normal muscle tone.  PSYCHIATRIC: Normal mood and affect. Normal behavior. Cooperative  CARDIAC DATABASE: EKG: 11/22/2019: Sinus tachycardia, ventricular rate of 110 bpm, normal axis, nonspecific ST depressions and T wave abnormalities.  Echocardiogram: 12/06/2019: LVEF 50-55%, mild LVH, grade 3 diastolic dysfunction, elevated left atrial pressure, severely dilated left atrium, mild MR, mild TR, mild PR.  Stress Testing: Lexiscan/modified Bruce Sestamibi stress test 12/20/2019: Stress EKG showed sinus tachycardia, nonspecific ST-T flattening in inferolateral leads. Normal myocardial perfusion. Mild global reduction in myocardial thickening. Stress LVEF 42%. High risk study due to low LVEF. Recommend clinical correlation.   Heart Catheterization: None  LABORATORY DATA: CBC Latest Ref Rng & Units 10/13/2019 10/12/2019 10/12/2019  WBC 4.0 - 10.5 K/uL 4.7 5.3 5.6  Hemoglobin 12.0 - 15.0 g/dL 9.1(L) 9.9(L) 9.6(L)  Hematocrit 36 - 46 % 27.2(L) 29.2(L) 28.6(L)  Platelets 150 - 400 K/uL 216 235 243    CMP Latest Ref Rng & Units 01/14/2020 01/06/2020 12/28/2019  Glucose 65 - 99 mg/dL 230(H) 97 110(H)  BUN 6 - 24 mg/dL '13 13 18  ' Creatinine 0.57 - 1.00 mg/dL 0.77 0.79 0.75  Sodium 134 - 144 mmol/L 141 139 143  Potassium 3.5 - 5.2 mmol/L 4.4 5.2 4.4  Chloride 96 - 106 mmol/L 108(H) 105 109(H)  CO2 20 - 29 mmol/L '21 24 22  ' Calcium 8.7 - 10.2 mg/dL 8.3(L) 8.8 8.5(L)  Total Protein 6.0 - 8.5 g/dL - - -  Total Bilirubin 0.0 - 1.2 mg/dL - - -  Alkaline Phos 39 - 117 IU/L - - -  AST  0 - 40 IU/L - - -  ALT 0 - 32 IU/L - - -   BNP (last 3 results) No results for input(s): BNP in the last 8760 hours.  ProBNP (last 3 results) Recent Labs    12/03/19 0951 12/10/19 1116 01/06/20 0954  PROBNP 1,283* 831* 773*    Lipid Panel     Component Value Date/Time   CHOL 242 (H) 12/03/2019 0952   TRIG 85 12/03/2019 0952   HDL 83 12/03/2019 0952   CHOLHDL 2.9 12/03/2019 0952   CHOLHDL 5.2 10/12/2019 0834   VLDL 16 10/12/2019 0834   LDLCALC 145 (H) 12/03/2019 0952   LDLDIRECT 104.1 04/12/2014 1608   LABVLDL 14 12/03/2019 0952    Lab Results  Component Value Date   HGBA1C 14.4 (H) 10/12/2019   HGBA1C 13.6 (H) 03/30/2019   HGBA1C 10.5 (H) 04/12/2014  No components found for: NTPROBNP Lab Results  Component Value Date   TSH 1.016 10/12/2019   TSH 0.995 04/23/2019   TSH 0.97 09/08/2013   FINAL MEDICATION LIST END OF ENCOUNTER: Meds ordered this encounter  Medications  . ivabradine (CORLANOR) 5 MG TABS tablet    Sig: Take 1 tablet (5 mg total) by mouth 2 (two) times daily with a meal for 7 days.    Dispense:  14 tablet    Refill:  0    Medications Discontinued During This Encounter  Medication Reason  . clindamycin (CLEOCIN) 150 MG capsule Completed Course  . metoprolol succinate (TOPROL-XL) 100 MG 24 hr tablet Discontinued by provider  . norethindrone (AYGESTIN) 5 MG tablet Completed Course  . isosorbide mononitrate (IMDUR) 30 MG 24 hr tablet Discontinued by provider  . sacubitril-valsartan (ENTRESTO) 24-26 MG Dose change     Current Outpatient Medications:  .  acetaminophen (TYLENOL) 500 MG tablet, Take 500-1,000 mg by mouth every 6 (six) hours as needed for mild pain or headache. , Disp: , Rfl:  .  albuterol (PROAIR HFA) 108 (90 Base) MCG/ACT inhaler, Inhale 2 puffs into the lungs every 4 (four) hours as needed for wheezing or shortness of breath. , Disp: , Rfl:  .  blood glucose meter kit and supplies KIT, Dispense based on patient and insurance  preference. Use up to four times daily as directed. (FOR ICD-10 E10.65)., Disp: 1 each, Rfl: 0 .  DULoxetine (CYMBALTA) 60 MG capsule, Take 60 mg by mouth daily., Disp: , Rfl:  .  fluticasone (FLONASE) 50 MCG/ACT nasal spray, Place into both nostrils daily as needed for allergies. , Disp: , Rfl:  .  gabapentin (NEURONTIN) 300 MG capsule, Take 300 mg by mouth at bedtime., Disp: , Rfl:  .  glucagon 1 MG injection, Follow package directions for low blood sugar. (Patient taking differently: Inject 1 mg into the muscle once as needed (Low blood glucose). Follow package directions for low blood sugar.), Disp: 1 each, Rfl: 1 .  hydrocortisone 2.5 % cream, Apply 1 application topically daily as needed (ulcers on legs). , Disp: , Rfl:  .  insulin aspart (NOVOLOG FLEXPEN) 100 UNIT/ML FlexPen, Inject 9 Units into the skin 3 (three) times daily with meals. (Patient taking differently: Inject 8 Units into the skin 3 (three) times daily with meals. Sliding scale), Disp: 5 pen, Rfl: 1 .  insulin degludec (TRESIBA FLEXTOUCH) 100 UNIT/ML FlexTouch Pen, Inject 0.5 mLs (50 Units total) into the skin at bedtime. (Patient taking differently: Inject 66 Units into the skin daily. ), Disp: 10 pen, Rfl: 1 .  Insulin Pen Needle (PEN NEEDLES) 31G X 5 MM MISC, 1 each by Does not apply route 6 (six) times daily., Disp: 200 each, Rfl: 11 .  ketoconazole (NIZORAL) 2 % shampoo, Apply 1 application topically daily as needed for irritation. , Disp: , Rfl:  .  Lancets (ACCU-CHEK SOFT TOUCH) lancets, Check 3-4 times daily for uncontrolled insulin dependent type 1 diabetes, Disp: 100 each, Rfl: 12 .  LORazepam (ATIVAN) 0.5 MG tablet, Take 0.5 mg by mouth 3 (three) times daily as needed for anxiety. , Disp: , Rfl:  .  Magnesium Hydroxide (DULCOLAX PO), Take 2 tablets by mouth daily as needed (constipation). Chewable , Disp: , Rfl:  .  olopatadine (PATADAY) 0.1 % ophthalmic solution, Place 1 drop into both eyes daily as needed for  allergies., Disp: , Rfl:  .  pantoprazole (PROTONIX) 40 MG tablet, Take 40 mg by mouth  daily., Disp: , Rfl:  .  promethazine (PHENERGAN) 25 MG tablet, Take 25 mg by mouth every 4 (four) hours as needed for nausea or vomiting., Disp: , Rfl:  .  Psyllium (METAMUCIL PO), Take 1 packet by mouth daily as needed (Constipation). Mix with water , Disp: , Rfl:  .  rosuvastatin (CRESTOR) 20 MG tablet, Take 1 tablet (20 mg total) by mouth daily., Disp: 90 tablet, Rfl: 0 .  sacubitril-valsartan (ENTRESTO) 49-51 MG, Take 1 tablet by mouth 2 (two) times daily., Disp: , Rfl:  .  senna-docusate (SENOKOT-S) 8.6-50 MG tablet, Take 1 tablet by mouth 2 (two) times daily between meals as needed for mild constipation., Disp: 180 tablet, Rfl: 1 .  ivabradine (CORLANOR) 5 MG TABS tablet, Take 1 tablet (5 mg total) by mouth 2 (two) times daily with a meal for 7 days., Disp: 14 tablet, Rfl: 0 .  metoprolol succinate (TOPROL-XL) 100 MG 24 hr tablet, Take 1 tablet (100 mg total) by mouth in the morning. Take with or immediately following a meal., Disp: 90 tablet, Rfl: 3  IMPRESSION:    ICD-10-CM   1. Hypotension, unspecified hypotension type  W10.2 Basic metabolic panel    Magnesium    Pregnancy, urine  2. Acute heart failure with preserved ejection fraction (HFpEF) (HCC)  I50.31 ivabradine (CORLANOR) 5 MG TABS tablet  3. Dyspnea on exertion  R06.00   4. Precordial chest pain  R07.2   5. Benign hypertension  I10   6. Mixed hyperlipidemia  E78.2   7. Type 2 diabetes mellitus with hyperglycemia, with long-term current use of insulin (HCC)  E11.65    Z79.4   8. Long-term insulin use (HCC)  Z79.4   9. Other specified diabetes mellitus with hyperglycemia, with long-term current use of insulin (HCC)  E13.65    Z79.4   10. Leg edema  R60.0      RECOMMENDATIONS: [name] is a 49 y.o. female whose past medical history and cardiac risk factors include:  Insulin-dependent diabetes mellitus, history of diabetic  ulcers, hypertension, hyperlipidemia, acute on chronic HFpEF.  Hypotension, symptomatic: Patient was worked into today's office schedule for evaluation of hypotension. Discontinue isosorbide mononitrate 30 mg p.o. daily Patient is asked to contact her endocrinologist for possible titration of her insulin if clinically warranted as patient does complain of episodes of diaphoresis, low blood sugar, lightheaded and dizzy. Check BMP and magnesium.  Abnormal nuclear stress test:  Patient is recommended continue with coronary CTA next week.  Patient is currently on Toprol-XL 100 mg p.o. daily.  We will give her samples for Ivabradine 5 g p.o. twice daily to start Saturday June 01/15/2020.  Patient is also asked to call her endocrinologist regarding insulin dosing prior to her upcoming coronary CTA .  Acute on chronic heart failure with preserved EF: Improving  Since last office visit patient has lost 13 pounds.   Continue Entresto 41/51 mg p.o. twice daily.  Continue Toprol-XL 100 mg p.o. every morning  Discontinue isosorbide mononitrate.  Start ivabradine 5 mg p.o. twice daily  Reemphasized the importance of a low-salt diet.  Recommend daily weight check, strict I/O's  Fluid restriction to <2L per day, Na restriction < 2g per day  Dyspnea on exertion: See above  Lower extremity swelling: See above  Insulin-dependent diabetes mellitus: Patient understands that her glycemic control is very uncontrolled given her most recent hemoglobin A1c.  She has established with endocrinology since last office visit and started on insulin therapy.   Benign  essential hypertension: Improving . Medication reconciled.  . See medication changes above.  . See the medication changes noted above.   . Low salt diet recommended. A diet that is rich in fruits, vegetables, legumes, and low-fat dairy products and low in snacks, sweets, and meats (such as the Dietary Approaches to Stop Hypertension  [DASH] diet).   Hyperlipidemia, mixed:   Continue rosuvastatin 20 mg p.o. nightly (started on Dec 04, 2019).  We will recheck lipid profile prior to the next office visit.  Continue Zetia.    Patient does not endorse any evidence of myalgias.    Orders Placed This Encounter  Procedures  . Basic metabolic panel  . Magnesium  . Pregnancy, urine   --Continue cardiac medications as reconciled in final medication list. --Return in 23 days (on 02/04/2020) for heart failure management., review test results.. Or sooner if needed. --Continue follow-up with your primary care physician regarding the management of your other chronic comorbid conditions.  Patient's questions and concerns were addressed to her satisfaction. She voices understanding of the instructions provided during this encounter.   This note was created using a voice recognition software as a result there may be grammatical errors inadvertently enclosed that do not reflect the nature of this encounter. Every attempt is made to correct such errors.  Rex Kras, [place], [facility]  Pager: [phone] Office: [phone]

## 2020-01-12 NOTE — Progress Notes (Signed)
Patient brought back to nurses statiion. States had a bad night with Blood sugar dropping to 62. Was 106 at home this am and when checked here was 75 and feels like it may be dropping. Feels dizzy and head spinning. BP 96/60 and Heart rate 81. Dr. Debara Pickett called and this info was relayed. To have this test rescheduled. CT aware and patient aware to call Cardiologist office and have test rescheduled. Cookies and Coke given and will recheck Blood sugar before discharged. Also advised to let cardiologist know re how she is feeling.

## 2020-01-14 ENCOUNTER — Other Ambulatory Visit: Payer: Self-pay

## 2020-01-14 ENCOUNTER — Other Ambulatory Visit: Payer: Self-pay | Admitting: Cardiology

## 2020-01-14 DIAGNOSIS — I959 Hypotension, unspecified: Secondary | ICD-10-CM | POA: Diagnosis not present

## 2020-01-14 MED ORDER — METOPROLOL SUCCINATE ER 100 MG PO TB24: 100.0000 mg | ORAL_TABLET | Freq: Every morning | ORAL | 3 refills | Status: DC

## 2020-01-15 LAB — BASIC METABOLIC PANEL
BUN/Creatinine Ratio: 17 (ref 9–23)
BUN: 13 mg/dL (ref 6–24)
CO2: 21 mmol/L (ref 20–29)
Calcium: 8.3 mg/dL — ABNORMAL LOW (ref 8.7–10.2)
Chloride: 108 mmol/L — ABNORMAL HIGH (ref 96–106)
Creatinine, Ser: 0.77 mg/dL (ref 0.57–1.00)
GFR calc Af Amer: 105 mL/min/{1.73_m2} (ref >59)
GFR calc non Af Amer: 91 mL/min/{1.73_m2} (ref >59)
Glucose: 230 mg/dL — ABNORMAL HIGH (ref 65–99)
Potassium: 4.4 mmol/L (ref 3.5–5.2)
Sodium: 141 mmol/L (ref 134–144)

## 2020-01-15 LAB — MAGNESIUM: Magnesium: 2.3 mg/dL (ref 1.6–2.3)

## 2020-01-15 LAB — PREGNANCY, URINE: Preg Test, Ur: NEGATIVE

## 2020-01-18 DIAGNOSIS — Z794 Long term (current) use of insulin: Secondary | ICD-10-CM | POA: Diagnosis not present

## 2020-01-18 DIAGNOSIS — E1142 Type 2 diabetes mellitus with diabetic polyneuropathy: Secondary | ICD-10-CM | POA: Diagnosis not present

## 2020-01-18 DIAGNOSIS — E1165 Type 2 diabetes mellitus with hyperglycemia: Secondary | ICD-10-CM | POA: Diagnosis not present

## 2020-01-18 DIAGNOSIS — E049 Nontoxic goiter, unspecified: Secondary | ICD-10-CM | POA: Diagnosis not present

## 2020-01-19 ENCOUNTER — Other Ambulatory Visit: Payer: Self-pay

## 2020-01-19 ENCOUNTER — Ambulatory Visit (HOSPITAL_COMMUNITY)
Admission: RE | Admit: 2020-01-19 | Discharge: 2020-01-19 | Disposition: A | Discharge status: Home / Self Care | Payer: BC Managed Care – PPO | Source: Ambulatory Visit | Attending: Cardiology | Admitting: Cardiology

## 2020-01-19 DIAGNOSIS — R9439 Abnormal result of other cardiovascular function study: Secondary | ICD-10-CM | POA: Insufficient documentation

## 2020-01-19 DIAGNOSIS — I959 Hypotension, unspecified: Secondary | ICD-10-CM

## 2020-01-19 DIAGNOSIS — R9430 Abnormal result of cardiovascular function study, unspecified: Secondary | ICD-10-CM | POA: Diagnosis not present

## 2020-01-19 DIAGNOSIS — R0600 Dyspnea, unspecified: Secondary | ICD-10-CM | POA: Diagnosis not present

## 2020-01-19 DIAGNOSIS — E782 Mixed hyperlipidemia: Secondary | ICD-10-CM

## 2020-01-19 DIAGNOSIS — I5031 Acute diastolic (congestive) heart failure: Secondary | ICD-10-CM

## 2020-01-19 MED ORDER — DILTIAZEM HCL 25 MG/5ML IV SOLN
5.0000 mg | Freq: Once | INTRAVENOUS | Status: AC
  Administered 2020-01-19: 5 mg via INTRAVENOUS
  Filled 2020-01-19: qty 5

## 2020-01-19 MED ORDER — NITROGLYCERIN 0.4 MG SL SUBL
SUBLINGUAL_TABLET | SUBLINGUAL | Status: AC
  Filled 2020-01-19: qty 2

## 2020-01-19 MED ORDER — DILTIAZEM HCL 25 MG/5ML IV SOLN
INTRAVENOUS | Status: AC
  Filled 2020-01-19: qty 5

## 2020-01-19 MED ORDER — NITROGLYCERIN 0.4 MG SL SUBL
0.8000 mg | SUBLINGUAL_TABLET | Freq: Once | SUBLINGUAL | Status: AC
  Administered 2020-01-19: 0.8 mg via SUBLINGUAL

## 2020-01-20 ENCOUNTER — Other Ambulatory Visit: Payer: Self-pay | Admitting: Cardiology

## 2020-01-20 ENCOUNTER — Other Ambulatory Visit: Payer: Self-pay

## 2020-01-20 DIAGNOSIS — I5031 Acute diastolic (congestive) heart failure: Secondary | ICD-10-CM

## 2020-01-20 MED ORDER — IVABRADINE HCL 5 MG PO TABS: 5.0000 mg | ORAL_TABLET | Freq: Two times a day (BID) | ORAL | 6 refills | Status: DC

## 2020-01-21 ENCOUNTER — Other Ambulatory Visit: Payer: Self-pay | Admitting: Cardiology

## 2020-01-21 DIAGNOSIS — R072 Precordial pain: Secondary | ICD-10-CM

## 2020-01-21 DIAGNOSIS — I5031 Acute diastolic (congestive) heart failure: Secondary | ICD-10-CM

## 2020-01-24 ENCOUNTER — Ambulatory Visit (HOSPITAL_COMMUNITY): Payer: BC Managed Care – PPO

## 2020-01-28 ENCOUNTER — Ambulatory Visit: Payer: BC Managed Care – PPO | Admitting: Gastroenterology

## 2020-01-31 DIAGNOSIS — E782 Mixed hyperlipidemia: Secondary | ICD-10-CM | POA: Diagnosis not present

## 2020-01-31 DIAGNOSIS — I5031 Acute diastolic (congestive) heart failure: Secondary | ICD-10-CM | POA: Diagnosis not present

## 2020-01-31 DIAGNOSIS — I959 Hypotension, unspecified: Secondary | ICD-10-CM | POA: Diagnosis not present

## 2020-02-01 LAB — LIPID PANEL WITH LDL/HDL RATIO
Cholesterol, Total: 205 mg/dL — ABNORMAL HIGH (ref 100–199)
HDL: 73 mg/dL (ref >39)
LDL Chol Calc (NIH): 119 mg/dL — ABNORMAL HIGH (ref 0–99)
LDL/HDL Ratio: 1.6 ratio (ref 0.0–3.2)
Triglycerides: 70 mg/dL (ref 0–149)
VLDL Cholesterol Cal: 13 mg/dL (ref 5–40)

## 2020-02-01 LAB — BASIC METABOLIC PANEL
BUN/Creatinine Ratio: 28 — ABNORMAL HIGH (ref 9–23)
BUN: 22 mg/dL (ref 6–24)
CO2: 21 mmol/L (ref 20–29)
Calcium: 8.5 mg/dL — ABNORMAL LOW (ref 8.7–10.2)
Chloride: 107 mmol/L — ABNORMAL HIGH (ref 96–106)
Creatinine, Ser: 0.79 mg/dL (ref 0.57–1.00)
GFR calc Af Amer: 102 mL/min/{1.73_m2} (ref >59)
GFR calc non Af Amer: 88 mL/min/{1.73_m2} (ref >59)
Glucose: 244 mg/dL — ABNORMAL HIGH (ref 65–99)
Potassium: 4.7 mmol/L (ref 3.5–5.2)
Sodium: 138 mmol/L (ref 134–144)

## 2020-02-01 LAB — PRO B NATRIURETIC PEPTIDE: NT-Pro BNP: 1696 pg/mL — ABNORMAL HIGH (ref 0–249)

## 2020-02-01 LAB — MAGNESIUM: Magnesium: 2.1 mg/dL (ref 1.6–2.3)

## 2020-02-03 ENCOUNTER — Ambulatory Visit: Payer: BC Managed Care – PPO | Admitting: Cardiology

## 2020-02-03 ENCOUNTER — Encounter: Payer: Self-pay | Admitting: Cardiology

## 2020-02-03 ENCOUNTER — Other Ambulatory Visit: Payer: Self-pay

## 2020-02-03 VITALS — BP 159/86 | HR 80 | Ht 72.0 in | Wt 172.8 lb

## 2020-02-03 DIAGNOSIS — R600 Localized edema: Secondary | ICD-10-CM

## 2020-02-03 DIAGNOSIS — E782 Mixed hyperlipidemia: Secondary | ICD-10-CM | POA: Diagnosis not present

## 2020-02-03 DIAGNOSIS — Z794 Long term (current) use of insulin: Secondary | ICD-10-CM

## 2020-02-03 DIAGNOSIS — I5031 Acute diastolic (congestive) heart failure: Secondary | ICD-10-CM | POA: Diagnosis not present

## 2020-02-03 DIAGNOSIS — Z712 Person consulting for explanation of examination or test findings: Secondary | ICD-10-CM

## 2020-02-03 DIAGNOSIS — R0609 Other forms of dyspnea: Secondary | ICD-10-CM | POA: Diagnosis not present

## 2020-02-03 DIAGNOSIS — I10 Essential (primary) hypertension: Secondary | ICD-10-CM

## 2020-02-03 MED ORDER — ROSUVASTATIN CALCIUM 20 MG PO TABS: 20.0000 mg | ORAL_TABLET | Freq: Every day | ORAL | 0 refills | Status: DC

## 2020-02-03 NOTE — Progress Notes (Signed)
[name] Date of Birth: 15-Feb-1971 MRN: [id number] [place], [name], [place] Primary Cardiologist: Rex Kras, DO (established care 11/2019)  Date: 02/03/20 Last Office Visit: 01/12/2020  Chief Complaint  Patient presents with   Congestive Heart Failure   Follow-up    test results, discuss referals   HPI  [name] is a 49 y.o. female who presents to the office with a chief complaint of " heart failure management and review test results" Patient's past medical history and cardiac risk factors include: Insulin-dependent diabetes mellitus, history of diabetic ulcers, hypertension, hyperlipidemia, acute on chronic HFpEF.  Patient was referred to the office at the request of her primary care provider for evaluation and management of congestive heart failure back in April 2021.  Since then her medical therapy has been uptitrated and her symptoms of chest pain or shortness of breath have improved.  Since the last office visit she has undergone CCTA and results reviewed with her and noted below for reference.   Since last office visit patient denies any chest pain.  However she does experience effort related dyspnea and weight gain.  Per office weights patient has gained approximately 9 pounds since last office visit.  This is probably most likely secondary to stopping diuretic therapy as she was hypotensive in the recent past.  In addition, she was supposed to be on Entresto 49/51 mg p.o. twice daily.  However patient continued to take only Entresto 24/26 mg p.o. twice daily.  Patient systolic blood pressures are also above goal and this is also secondary to backing off on antihypertensive medications at the last office visit for symptoms of hypotension.  Independently reviewed the most recent blood work and discussed with the patient at today's office visit.  Patient's mother passed away at the age of 4 secondary congestive heart failure.  Denies  prior history of coronary artery disease, myocardial infarction, congestive heart failure, deep venous thrombosis, pulmonary embolism, stroke, transient ischemic attack.  ALLERGIES: Allergies  Allergen Reactions   Amoxicillin Itching   Lantus [Insulin Glargine] Itching and Rash   MEDICATION LIST PRIOR TO VISIT: Current Outpatient Medications on File Prior to Visit  Medication Sig Dispense Refill   acetaminophen (TYLENOL) 500 MG tablet Take 500-1,000 mg by mouth every 6 (six) hours as needed for mild pain or headache.      albuterol (PROAIR HFA) 108 (90 Base) MCG/ACT inhaler Inhale 2 puffs into the lungs every 4 (four) hours as needed for wheezing or shortness of breath.      blood glucose meter kit and supplies KIT Dispense based on patient and insurance preference. Use up to four times daily as directed. (FOR ICD-10 E10.65). 1 each 0   DULoxetine (CYMBALTA) 60 MG capsule Take 60 mg by mouth daily.     fluticasone (FLONASE) 50 MCG/ACT nasal spray Place into both nostrils daily as needed for allergies.      gabapentin (NEURONTIN) 300 MG capsule Take 300 mg by mouth at bedtime.     glucagon 1 MG injection Follow package directions for low blood sugar. (Patient taking differently: Inject 1 mg into the muscle once as needed (Low blood glucose). Follow package directions for low blood sugar.) 1 each 1   hydrocortisone 2.5 % cream Apply 1 application topically daily as needed (ulcers on legs).      insulin aspart (NOVOLOG FLEXPEN) 100 UNIT/ML FlexPen Inject 9 Units into the skin 3 (three) times daily with meals. (Patient taking differently: Inject 8 Units into the skin 3 (  three) times daily with meals. Sliding scale) 5 pen 1   insulin degludec (TRESIBA FLEXTOUCH) 100 UNIT/ML FlexTouch Pen Inject 0.5 mLs (50 Units total) into the skin at bedtime. (Patient taking differently: Inject 66 Units into the skin daily. ) 10 pen 1   Insulin Pen Needle (PEN NEEDLES) 31G X 5 MM MISC 1 each by Does  not apply route 6 (six) times daily. 200 each 11   ivabradine (CORLANOR) 5 MG TABS tablet Take 1 tablet (5 mg total) by mouth 2 (two) times daily with a meal. 60 tablet 6   ketoconazole (NIZORAL) 2 % shampoo Apply 1 application topically daily as needed for irritation.      Lancets (ACCU-CHEK SOFT TOUCH) lancets Check 3-4 times daily for uncontrolled insulin dependent type 1 diabetes 100 each 12   LORazepam (ATIVAN) 0.5 MG tablet Take 0.5 mg by mouth 3 (three) times daily as needed for anxiety.      Magnesium Hydroxide (DULCOLAX PO) Take 2 tablets by mouth daily as needed (constipation). Chewable      metoprolol succinate (TOPROL-XL) 100 MG 24 hr tablet Take 1 tablet (100 mg total) by mouth in the morning. Take with or immediately following a meal. 90 tablet 3   olopatadine (PATADAY) 0.1 % ophthalmic solution Place 1 drop into both eyes daily as needed for allergies.     promethazine (PHENERGAN) 25 MG tablet Take 25 mg by mouth every 4 (four) hours as needed for nausea or vomiting.     Psyllium (METAMUCIL PO) Take 1 packet by mouth daily as needed (Constipation). Mix with water      sacubitril-valsartan (ENTRESTO) 49-51 MG Take 1 tablet by mouth 2 (two) times daily.     senna-docusate (SENOKOT-S) 8.6-50 MG tablet Take 1 tablet by mouth 2 (two) times daily between meals as needed for mild constipation. 180 tablet 1   No current facility-administered medications on file prior to visit.    PAST MEDICAL HISTORY: Past Medical History:  Diagnosis Date   Arthritis    CHF (congestive heart failure) (HCC)    History of chicken pox    Migraines    Mood disorder (HCC)    anxiety   Prurigo nodularis    with diabetic dermopathy   Type 1 diabetes, uncontrolled, with neuropathy ([place])    [place]    PAST SURGICAL HISTORY: Past Surgical History:  Procedure Laterality Date   treadmill stress test  01/2013   WNL, low risk study    FAMILY HISTORY: The patient family history  includes Bipolar disorder in her mother; CAD in her maternal aunt; Calcium disorder in her mother; Cancer in her maternal grandmother, maternal uncle, and paternal grandmother; Cancer (age of onset: 75) in her maternal aunt; Diabetes in her mother, paternal grandfather, paternal grandmother, and sister; Heart disease in her mother; Heart failure in her sister; Hypertension in her mother; Stroke in her maternal aunt; Thyroid disease in her mother.   SOCIAL HISTORY:  The patient  reports that she has never smoked. She has never used smokeless tobacco. She reports previous alcohol use. She reports that she does not use drugs.  REVIEW OF SYSTEMS: Review of Systems  Constitutional: Negative for chills, fever and malaise/fatigue.  HENT: Negative for ear discharge, ear pain and nosebleeds.   Eyes: Negative for blurred vision and discharge.  Cardiovascular: Positive for dyspnea on exertion and leg swelling. Negative for chest pain, claudication, near-syncope, orthopnea, palpitations, paroxysmal nocturnal dyspnea and syncope.  Respiratory: Negative for cough and shortness of  breath.   Endocrine: Negative for cold intolerance, heat intolerance, polydipsia, polyphagia and polyuria.  Hematologic/Lymphatic: Negative for bleeding problem.  Skin: Negative for flushing and nail changes.  Musculoskeletal: Negative for muscle cramps, muscle weakness and myalgias.  Gastrointestinal: Negative for abdominal pain, dysphagia, heartburn, hematemesis, hematochezia, melena, nausea and vomiting.  Neurological: Positive for paresthesias. Negative for dizziness, focal weakness and light-headedness.   PHYSICAL EXAM:  Today's Vitals   02/03/20 1119  BP: (!) 159/86  Pulse: 80  SpO2: 99%  Weight: 172 lb 12.8 oz (78.4 kg)  Height: 6' (1.829 m)   Body mass index is 23.44 kg/m.  CONSTITUTIONAL: Appears older than stated age, hemodynamically stable, no acute distress.  SKIN: Skin is warm and dry. No rash noted. No  cyanosis. No pallor. No jaundice HEAD: Normocephalic and atraumatic.  EYES: No scleral icterus MOUTH/THROAT: Moist oral membranes.  NECK: No JVD present. No thyromegaly noted. No carotid bruits  LYMPHATIC: No visible cervical adenopathy.  CHEST Normal respiratory effort. No intercostal retractions  LUNGS: Clear to auscultation bilaterally.  No stridor. No wheezes. No rales.  CARDIOVASCULAR: Tachycardic, positive S1-S2, no murmurs rubs or gallops appreciated most likely secondary to tachycardia. ABDOMINAL: Nonobese, soft, nontender, nondistended, positive bowel sounds in all 4 quadrants, no apparent ascites.  EXTREMITIES: +1 peripheral edema bilaterally, warm to touch bilaterally, old ulcers on the right lower extremity appear to be healed but present. HEMATOLOGIC: No significant bruising NEUROLOGIC: Oriented to person, place, and time. Nonfocal. Normal muscle tone.  PSYCHIATRIC: Normal mood and affect. Normal behavior. Cooperative  CARDIAC DATABASE: EKG: 11/22/2019: Sinus tachycardia, ventricular rate of 110 bpm, normal axis, nonspecific ST depressions and T wave abnormalities.  Echocardiogram: 12/06/2019: LVEF 50-55%, mild LVH, grade 3 diastolic dysfunction, elevated left atrial pressure, severely dilated left atrium, mild MR, mild TR, mild PR.  Stress Testing: Lexiscan/modified Bruce Sestamibi stress test 12/20/2019: Stress EKG showed sinus tachycardia, nonspecific ST-T flattening in inferolateral leads. Normal myocardial perfusion. Mild global reduction in myocardial thickening. Stress LVEF 42%. High risk study due to low LVEF. Recommend clinical correlation.   Coronary CTA 01/19/2020: 1. Coronary calcium score of 0.  2. Normal coronary origin with right dominance.  3. Normal coronary arteries.  4. Severe biatrial enlargement.  Heart Catheterization: None  LABORATORY DATA: CBC Latest Ref Rng & Units 10/13/2019 10/12/2019 10/12/2019  WBC 4.0 - 10.5 K/uL 4.7 5.3 5.6  Hemoglobin 12.0 -  15.0 g/dL 9.1(L) 9.9(L) 9.6(L)  Hematocrit 36 - 46 % 27.2(L) 29.2(L) 28.6(L)  Platelets 150 - 400 K/uL 216 235 243    CMP Latest Ref Rng & Units 01/31/2020 01/14/2020 01/06/2020  Glucose 65 - 99 mg/dL 244(H) 230(H) 97  BUN 6 - 24 mg/dL '22 13 13  ' Creatinine 0.57 - 1.00 mg/dL 0.79 0.77 0.79  Sodium 134 - 144 mmol/L 138 141 139  Potassium 3.5 - 5.2 mmol/L 4.7 4.4 5.2  Chloride 96 - 106 mmol/L 107(H) 108(H) 105  CO2 20 - 29 mmol/L '21 21 24  ' Calcium 8.7 - 10.2 mg/dL 8.5(L) 8.3(L) 8.8  Total Protein 6.0 - 8.5 g/dL - - -  Total Bilirubin 0.0 - 1.2 mg/dL - - -  Alkaline Phos 39 - 117 IU/L - - -  AST 0 - 40 IU/L - - -  ALT 0 - 32 IU/L - - -   BNP (last 3 results) No results for input(s): BNP in the last 8760 hours.  ProBNP (last 3 results) Recent Labs    12/10/19 1116 01/06/20 0954 01/31/20 1008  PROBNP  831* 773* 1,696*    Lipid Panel     Component Value Date/Time   CHOL 205 (H) 01/31/2020 1008   TRIG 70 01/31/2020 1008   HDL 73 01/31/2020 1008   CHOLHDL 2.9 12/03/2019 0952   CHOLHDL 5.2 10/12/2019 0834   VLDL 16 10/12/2019 0834   LDLCALC 119 (H) 01/31/2020 1008   LDLDIRECT 104.1 04/12/2014 1608   LABVLDL 13 01/31/2020 1008   Lab Results  Component Value Date   HGBA1C 14.4 (H) 10/12/2019   HGBA1C 13.6 (H) 03/30/2019   HGBA1C 10.5 (H) 04/12/2014   No components found for: NTPROBNP Lab Results  Component Value Date   TSH 1.016 10/12/2019   TSH 0.995 04/23/2019   TSH 0.97 09/08/2013   FINAL MEDICATION LIST END OF ENCOUNTER: Meds ordered this encounter  Medications   rosuvastatin (CRESTOR) 20 MG tablet    Sig: Take 1 tablet (20 mg total) by mouth at bedtime.    Dispense:  90 tablet    Refill:  0    Medications Discontinued During This Encounter  Medication Reason   pantoprazole (PROTONIX) 40 MG tablet Completed Course   rosuvastatin (CRESTOR) 20 MG tablet Patient Preference     Current Outpatient Medications:    acetaminophen (TYLENOL) 500 MG tablet, Take  500-1,000 mg by mouth every 6 (six) hours as needed for mild pain or headache. , Disp: , Rfl:    albuterol (PROAIR HFA) 108 (90 Base) MCG/ACT inhaler, Inhale 2 puffs into the lungs every 4 (four) hours as needed for wheezing or shortness of breath. , Disp: , Rfl:    blood glucose meter kit and supplies KIT, Dispense based on patient and insurance preference. Use up to four times daily as directed. (FOR ICD-10 E10.65)., Disp: 1 each, Rfl: 0   DULoxetine (CYMBALTA) 60 MG capsule, Take 60 mg by mouth daily., Disp: , Rfl:    fluticasone (FLONASE) 50 MCG/ACT nasal spray, Place into both nostrils daily as needed for allergies. , Disp: , Rfl:    gabapentin (NEURONTIN) 300 MG capsule, Take 300 mg by mouth at bedtime., Disp: , Rfl:    glucagon 1 MG injection, Follow package directions for low blood sugar. (Patient taking differently: Inject 1 mg into the muscle once as needed (Low blood glucose). Follow package directions for low blood sugar.), Disp: 1 each, Rfl: 1   hydrocortisone 2.5 % cream, Apply 1 application topically daily as needed (ulcers on legs). , Disp: , Rfl:    insulin aspart (NOVOLOG FLEXPEN) 100 UNIT/ML FlexPen, Inject 9 Units into the skin 3 (three) times daily with meals. (Patient taking differently: Inject 8 Units into the skin 3 (three) times daily with meals. Sliding scale), Disp: 5 pen, Rfl: 1   insulin degludec (TRESIBA FLEXTOUCH) 100 UNIT/ML FlexTouch Pen, Inject 0.5 mLs (50 Units total) into the skin at bedtime. (Patient taking differently: Inject 66 Units into the skin daily. ), Disp: 10 pen, Rfl: 1   Insulin Pen Needle (PEN NEEDLES) 31G X 5 MM MISC, 1 each by Does not apply route 6 (six) times daily., Disp: 200 each, Rfl: 11   ivabradine (CORLANOR) 5 MG TABS tablet, Take 1 tablet (5 mg total) by mouth 2 (two) times daily with a meal., Disp: 60 tablet, Rfl: 6   ketoconazole (NIZORAL) 2 % shampoo, Apply 1 application topically daily as needed for irritation. , Disp: , Rfl:     Lancets (ACCU-CHEK SOFT TOUCH) lancets, Check 3-4 times daily for uncontrolled insulin dependent type 1 diabetes, Disp: 100  each, Rfl: 12   LORazepam (ATIVAN) 0.5 MG tablet, Take 0.5 mg by mouth 3 (three) times daily as needed for anxiety. , Disp: , Rfl:    Magnesium Hydroxide (DULCOLAX PO), Take 2 tablets by mouth daily as needed (constipation). Chewable , Disp: , Rfl:    metoprolol succinate (TOPROL-XL) 100 MG 24 hr tablet, Take 1 tablet (100 mg total) by mouth in the morning. Take with or immediately following a meal., Disp: 90 tablet, Rfl: 3   olopatadine (PATADAY) 0.1 % ophthalmic solution, Place 1 drop into both eyes daily as needed for allergies., Disp: , Rfl:    promethazine (PHENERGAN) 25 MG tablet, Take 25 mg by mouth every 4 (four) hours as needed for nausea or vomiting., Disp: , Rfl:    Psyllium (METAMUCIL PO), Take 1 packet by mouth daily as needed (Constipation). Mix with water , Disp: , Rfl:    sacubitril-valsartan (ENTRESTO) 49-51 MG, Take 1 tablet by mouth 2 (two) times daily., Disp: , Rfl:    senna-docusate (SENOKOT-S) 8.6-50 MG tablet, Take 1 tablet by mouth 2 (two) times daily between meals as needed for mild constipation., Disp: 180 tablet, Rfl: 1   rosuvastatin (CRESTOR) 20 MG tablet, Take 1 tablet (20 mg total) by mouth at bedtime., Disp: 90 tablet, Rfl: 0  IMPRESSION:    ICD-10-CM   1. Acute heart failure with preserved ejection fraction (HFpEF) (HCC)  I50.31 Pro b natriuretic peptide (BNP)    Magnesium    Basic metabolic panel  2. Mixed hyperlipidemia  E78.2 rosuvastatin (CRESTOR) 20 MG tablet  3. Dyspnea on exertion  R06.00 Pro b natriuretic peptide (BNP)    Magnesium    Basic metabolic panel  4. Benign hypertension  I10   5. Type 2 diabetes mellitus with hyperglycemia, with long-term current use of insulin (HCC)  E11.65    Z79.4   6. Long-term insulin use (HCC)  Z79.4      RECOMMENDATIONS: [name] is a 49 y.o. female whose past medical  history and cardiac risk factors include:  Insulin-dependent diabetes mellitus, history of diabetic ulcers, hypertension, hyperlipidemia, acute on chronic HFpEF.  Acute on chronic heart failure with preserved EF: Improving  Since last office visit patient has gained 9 pounds.   Patient is currently taking Entresto 24/26 mg p.o. twice daily despite the fact she was supposed to be on 2 tablets of Entresto 24/26 mg p.o. twice daily.    Patient is asked to start Entresto 24/26 mg 2 tabs p.o. twice daily for 1 week and have repeat blood work to evaluate kidney function and electrolytes.   If the repeat blood work is normal will prescribe Entresto 97/103 mg p.o. twice daily.  Continue Toprol-XL 100 mg p.o. every morning  Continue ivabradine 5 mg p.o. twice daily  Restart rosuvastatin.  Reemphasized the importance of a low-salt diet.  Recommend daily weight check, strict I/O's  Fluid restriction to <2L per day, Na restriction < 2g per day  Dyspnea on exertion: See above  Lower extremity swelling: See above  Insulin-dependent diabetes mellitus: Patient understands that her glycemic control is very uncontrolled given her most recent hemoglobin A1c.  She has established with endocrinology since last office visit and started on insulin therapy.   Benign essential hypertension:  Medication reconciled.   See medication changes above.  Of note, patient was on Imdur in the past which caused her to be hypotensive.  See the medication changes noted above.    Low salt diet recommended. A diet  that is rich in fruits, vegetables, legumes, and low-fat dairy products and low in snacks, sweets, and meats (such as the Dietary Approaches to Stop Hypertension [DASH] diet).   Hyperlipidemia, mixed:   Restart rosuvastatin 20 mg p.o. nightly.  Continue Zetia.    Patient does not endorse any evidence of myalgias.    Most recent lipid profile reviewed.  Orders Placed This Encounter  Procedures     Pro b natriuretic peptide (BNP)   Magnesium   Basic metabolic panel   --Continue cardiac medications as reconciled in final medication list. --Return in about 2 weeks (around 02/17/2020) for heart failure management.. Or sooner if needed. --Continue follow-up with your primary care physician regarding the management of your other chronic comorbid conditions.  Time spent: 40 minutes.  Patient's questions and concerns were addressed to her satisfaction. She voices understanding of the instructions provided during this encounter.   This note was created using a voice recognition software as a result there may be grammatical errors inadvertently enclosed that do not reflect the nature of this encounter. Every attempt is made to correct such errors.  Rex Kras, [place], [facility]  Pager: [phone] Office: [phone]

## 2020-02-04 ENCOUNTER — Ambulatory Visit: Payer: BC Managed Care – PPO | Admitting: Cardiology

## 2020-02-04 DIAGNOSIS — R05 Cough: Secondary | ICD-10-CM | POA: Diagnosis not present

## 2020-02-13 ENCOUNTER — Other Ambulatory Visit: Payer: Self-pay | Admitting: Cardiology

## 2020-02-13 DIAGNOSIS — R0609 Other forms of dyspnea: Secondary | ICD-10-CM

## 2020-02-13 DIAGNOSIS — E1365 Other specified diabetes mellitus with hyperglycemia: Secondary | ICD-10-CM

## 2020-02-13 DIAGNOSIS — I10 Essential (primary) hypertension: Secondary | ICD-10-CM

## 2020-02-14 ENCOUNTER — Telehealth: Payer: Self-pay

## 2020-02-14 NOTE — Telephone Encounter (Signed)
Pt called to informus that she has been feeling worse then before, pt mention she has SOB, CP, dizziness, and eye flashing with nausea. Denies headaches, lightheaded, nor feeling of passing out. Please advise thank you

## 2020-02-15 ENCOUNTER — Other Ambulatory Visit: Payer: Self-pay

## 2020-02-15 DIAGNOSIS — I5031 Acute diastolic (congestive) heart failure: Secondary | ICD-10-CM

## 2020-02-15 DIAGNOSIS — R0609 Other forms of dyspnea: Secondary | ICD-10-CM

## 2020-02-15 NOTE — Telephone Encounter (Signed)
Left a message to call back.

## 2020-02-25 ENCOUNTER — Other Ambulatory Visit: Payer: Self-pay | Admitting: Cardiology

## 2020-02-25 ENCOUNTER — Ambulatory Visit: Payer: BC Managed Care – PPO | Admitting: Cardiology

## 2020-02-25 DIAGNOSIS — R600 Localized edema: Secondary | ICD-10-CM

## 2020-02-25 DIAGNOSIS — R0609 Other forms of dyspnea: Secondary | ICD-10-CM

## 2020-03-07 ENCOUNTER — Telehealth: Payer: Self-pay

## 2020-03-07 ENCOUNTER — Other Ambulatory Visit: Payer: Self-pay | Admitting: Cardiology

## 2020-03-07 DIAGNOSIS — R0609 Other forms of dyspnea: Secondary | ICD-10-CM

## 2020-03-07 DIAGNOSIS — R600 Localized edema: Secondary | ICD-10-CM

## 2020-03-07 DIAGNOSIS — I10 Essential (primary) hypertension: Secondary | ICD-10-CM

## 2020-03-07 DIAGNOSIS — I5031 Acute diastolic (congestive) heart failure: Secondary | ICD-10-CM

## 2020-03-07 NOTE — Telephone Encounter (Signed)
LM for pt to call.. Dr Tamala Julian would like to see her one day next week.

## 2020-03-08 ENCOUNTER — Telehealth: Payer: Self-pay | Admitting: General Practice

## 2020-03-08 ENCOUNTER — Other Ambulatory Visit: Payer: Self-pay

## 2020-03-08 DIAGNOSIS — L97911 Non-pressure chronic ulcer of unspecified part of right lower leg limited to breakdown of skin: Secondary | ICD-10-CM

## 2020-03-08 NOTE — Telephone Encounter (Signed)
Pt returned my call and is scheduled to see Dr Tamala Julian on Monday 03/13/2020.

## 2020-03-08 NOTE — Addendum Note (Signed)
Addended byDoylene Bode on: 03/08/2020 08:54 AM   Modules accepted: Orders

## 2020-03-08 NOTE — Telephone Encounter (Signed)
LM for pt to call back to schedule appt with Dr Tamala Julian next week.

## 2020-03-08 NOTE — Telephone Encounter (Signed)
    Pt is returning call from April. Transferred call

## 2020-03-12 NOTE — Progress Notes (Signed)
Cardiology Office Note:    Date:  03/13/2020   ID:  [name], DOB [birth year], MRN [id number]  PCP:  Shanon Rosser, PA-C  Cardiologist:  No primary care provider on file.   Referring MD: Shanon Rosser, PA-C   Chief Complaint  Patient presents with  . Congestive Heart Failure  . Advice Only    Second Cardiology Opinion    History of Present Illness:    [name] is a 49 y.o. female with a hx of acute on chronic diastolic heart failure, essential hypertension, type 1 diabetes mellitus, migraine headaches, and hypotension on Imdur/heart failure therapy associated with syncope. The patient is requesting a second opinion evaluation.  The patient has been treated by Dr. Rex Kras for acute heart failure with preserved systolic function based upon initial presentation with volume overload (marked lower extremity edema and orthopnea), mild left ventricular hypertrophy with low normal EF of 50 to 76%, grade 3 diastolic dysfunction, intermediate risk myocardial perfusion study, and coronary CTA with widely patent coronary arteries and calcium score of 0.  Aggressive medical therapy with Entresto, Toprol-XL, ivabradine, and diuretic therapy.  The patient has discontinued all medication because she felt terrible and found it difficult to stand and ambulate.  She denies shortness of breath at rest while sitting but does have orthopnea.  Lower extremity edema has not recurred.  Chest pain has not been a significant issue.  Past Medical History:  Diagnosis Date  . Arthritis   . CHF (congestive heart failure) ([place])   . History of chicken pox   . Migraines   . Mood disorder (HCC)    anxiety  . Prurigo nodularis    with diabetic dermopathy  . Type 1 diabetes, uncontrolled, with neuropathy ([place])    [place]    Past Surgical History:  Procedure Laterality Date  . treadmill stress test  01/2013   WNL, low risk study    Current Medications: Current Meds  Medication  Sig  . acetaminophen (TYLENOL) 500 MG tablet Take 500-1,000 mg by mouth every 6 (six) hours as needed for mild pain or headache.   . albuterol (PROAIR HFA) 108 (90 Base) MCG/ACT inhaler Inhale 2 puffs into the lungs every 4 (four) hours as needed for wheezing or shortness of breath.   . blood glucose meter kit and supplies KIT Dispense based on patient and insurance preference. Use up to four times daily as directed. (FOR ICD-10 E10.65).  Marland Kitchen insulin aspart (NOVOLOG FLEXPEN) 100 UNIT/ML FlexPen Inject 9 Units into the skin 3 (three) times daily with meals.  . insulin degludec (TRESIBA FLEXTOUCH) 100 UNIT/ML FlexTouch Pen Inject 0.5 mLs (50 Units total) into the skin at bedtime.  . Insulin Pen Needle (PEN NEEDLES) 31G X 5 MM MISC 1 each by Does not apply route 6 (six) times daily.  . Lancets (ACCU-CHEK SOFT TOUCH) lancets Check 3-4 times daily for uncontrolled insulin dependent type 1 diabetes  . olopatadine (PATADAY) 0.1 % ophthalmic solution Place 1 drop into both eyes daily as needed for allergies.  . promethazine (PHENERGAN) 25 MG tablet Take 25 mg by mouth every 4 (four) hours as needed for nausea or vomiting.  . Psyllium (METAMUCIL PO) Take 1 packet by mouth daily as needed (Constipation). Mix with water   . senna-docusate (SENOKOT-S) 8.6-50 MG tablet Take 1 tablet by mouth 2 (two) times daily between meals as needed for mild constipation.     Allergies:   Amoxicillin and Lantus [insulin glargine]   Social History  Socioeconomic History  . Marital status: Married    Spouse name: Not on file  . Number of children: 2  . Years of education: Not on file  . Highest education level: Not on file  Occupational History  . Not on file  Tobacco Use  . Smoking status: Never Smoker  . Smokeless tobacco: Never Used  Vaping Use  . Vaping Use: Never used  Substance and Sexual Activity  . Alcohol use: Not Currently  . Drug use: No  . Sexual activity: Yes    Partners: Male    Birth  control/protection: Pill  Other Topics Concern  . Not on file  Social History Narrative   Caffeine use: daily   Occupation: cleans houses   Regular exercise: yes, active 5x/wk   Diet: good water, fruits/vegetables daily   Social Determinants of Health   Financial Resource Strain:   . Difficulty of Paying Living Expenses:   Food Insecurity:   . Worried About Charity fundraiser in the Last Year:   . Arboriculturist in the Last Year:   Transportation Needs:   . Film/video editor (Medical):   Marland Kitchen Lack of Transportation (Non-Medical):   Physical Activity:   . Days of Exercise per Week:   . Minutes of Exercise per Session:   Stress:   . Feeling of Stress :   Social Connections:   . Frequency of Communication with Friends and Family:   . Frequency of Social Gatherings with Friends and Family:   . Attends Religious Services:   . Active Member of Clubs or Organizations:   . Attends Archivist Meetings:   Marland Kitchen Marital Status:      Family History: The patient's family history includes Bipolar disorder in her mother; CAD in her maternal aunt; Calcium disorder in her mother; Cancer in her maternal grandmother, maternal uncle, and paternal grandmother; Cancer (age of onset: 38) in her maternal aunt; Diabetes in her mother, paternal grandfather, paternal grandmother, and sister; Heart disease in her mother; Heart failure in her sister; Hypertension in her mother; Stroke in her maternal aunt; Thyroid disease in her mother.  ROS:   Please see the history of present illness.    Very poorly controlled diabetes with hemoglobin A1c 8.9.  Now seeing Dr. Delrae Rend for endocrine/diabetes management.  She is not a smoker.  She has no history of autoimmune disease, sarcoid, or other systemic inflammatory process.  Also complains of nausea, early satiety, and weight loss.  Review of labs demonstrated anemia with hemoglobin of 9.1 in March 2021.  She has numbness in her feet.  All other  systems reviewed and are negative.  EKGs/Labs/Other Studies Reviewed:    The following studies were reviewed today:   Hemoglobin trend since August 2020 has been a reduction from 13.6-9.1 in March 2021.  Echocardiogram 2021: 12/06/2019: LVEF 50-55%, mild LVH, grade 3 diastolic dysfunction, elevated left atrial pressure, severely dilated left atrium, mild MR, mild TR, mild PR.  Stress Testing 2021: Lexiscan/modified Bruce Sestamibi stress test 12/20/2019: Stress EKG showed sinus tachycardia, nonspecific ST-T flattening in inferolateral leads. Normal myocardial perfusion. Mild global reduction in myocardial thickening. Stress LVEF 42%. High risk study due to low LVEF. Recommend clinical correlation.   Coronary CTA 01/19/2020: 1. Coronary calcium score of 0.  2. Normal coronary origin with right dominance.  3. Normal coronary arteries.  4. Severe biatrial enlargement.  EKG:  EKG performed January 07, 2020, demonstrates normal sinus rhythm, atrial abnormality, prominent voltage,  and nonspecific ST-T wave abnormality.  Recent Labs: 10/12/2019: TSH 1.016 10/13/2019: Hemoglobin 9.1; Platelets 216 11/24/2019: ALT 12 01/31/2020: BUN 22; Creatinine, Ser 0.79; Magnesium 2.1; NT-Pro BNP 1,696; Potassium 4.7; Sodium 138  Recent Lipid Panel    Component Value Date/Time   CHOL 205 (H) 01/31/2020 1008   TRIG 70 01/31/2020 1008   HDL 73 01/31/2020 1008   CHOLHDL 2.9 12/03/2019 0952   CHOLHDL 5.2 10/12/2019 0834   VLDL 16 10/12/2019 0834   LDLCALC 119 (H) 01/31/2020 1008   LDLDIRECT 104.1 04/12/2014 1608    Physical Exam:    VS:  BP 92/60   Pulse (!) 110   Ht 6' (1.829 m)   Wt 153 lb (69.4 kg)   SpO2 99%   BMI 20.75 kg/m     ORTHOSTATIC BLOOD PRESSURE: Lying: Blood pressure 135/84 mmHg; heart rate 112 bpm Sitting: Blood pressure 100/68 mmHg; heart rate 120 bpm Standing: Blood pressure 92/60 mmHg; heart rate 124 bpm   Wt Readings from Last 3 Encounters:  03/13/20 153 lb (69.4 kg)    02/03/20 172 lb 12.8 oz (78.4 kg)  01/12/20 163 lb (73.9 kg)     GEN: Chronically ill-appearing. No acute distress HEENT: Normal NECK: No JVD.  While lying at 20 degrees, no JVD is noted.  No carotid bruits are heard. LYMPHATICS: No lymphadenopathy CARDIAC: Tachycardia with RRR without murmur, gallop, or edema. VASCULAR:  Normal Pulses in popliteal region..  Pedal pulses are difficult to palpate.  Radial pulses are 2+ and symmetric.Marland Kitchen RESPIRATORY:  Clear to auscultation without rales, wheezing or rhonchi  ABDOMEN: Soft, non-tender, non-distended, No pulsatile mass, MUSCULOSKELETAL: No deformity  SKIN: Warm and dry with multiple healed lesions on the legs and upper extremities. NEUROLOGIC:  Alert and oriented x 3 PSYCHIATRIC:  Normal affect   ASSESSMENT:    1. Orthostatic hypotension   2. Anemia of chronic disease   3. Dyspnea on exertion   4. Type 1 diabetes mellitus with complications (HCC)   5. Mixed hyperlipidemia   6. Diastolic dysfunction   7. Benign hypertension   8. Educated about COVID-19 virus infection    PLAN:    In order of problems listed above:  1. Off antihypertensive medication, severe orthostasis is present..  The increased heart rate and dramatic drop in blood pressure from 135/84 mmHg to 92/60 mmHg is severe.  Etiology could include volume contraction from poor intake or GI bleeding.  With poorly controlled diabetes, autonomic neuropathy is a distinct possibility.  Amyloidosis (given slight increase in LV thickness) seems less likely.  I have recommended that she stay off of antihypertensive agents, increase fluid intake, increase salt intake, and wear knee-high moderate tension support stockings.  If she notices fluid retention she should contact her cardiologist. 2. Anemia needs to be evaluated.  I have recommended that she get a primary care physician. 3. Dyspnea could be of mixed etiology.  Clearly is not volume overloaded on exam today and there is no  evidence of edema. 4. Did not discuss 5. No evidence of diastolic heart failure on clinical exam off therapy. 6. Severe orthostasis will make it difficult to treat elevated blood pressure.  No specific antihypertensive therapy is recommended at this time. 7. COVID-19 vaccine is been received.  This note will be copied to both Dr. Terri Skains and Dr. Buddy Duty.  Anemia evaluation and also consideration of connective tissue disease, sarcoid, and amyloidosis should not be forgotten.   Medication Adjustments/Labs and Tests Ordered: Current medicines are reviewed at  length with the patient today.  Concerns regarding medicines are outlined above.  No orders of the defined types were placed in this encounter.  No orders of the defined types were placed in this encounter.   Patient Instructions  Medication Instructions:  Your physician recommends that you continue on your current medications as directed. Please refer to the Current Medication list given to you today.  *If you need a refill on your cardiac medications before your next appointment, please call your pharmacy*   Lab Work: None If you have labs (blood work) drawn today and your tests are completely normal, you will receive your results only by: Marland Kitchen MyChart Message (if you have MyChart) OR . A paper copy in the mail If you have any lab test that is abnormal or we need to change your treatment, we will call you to review the results.   Testing/Procedures: None   Follow-Up: At [facility], you and your health needs are our priority.  As part of our continuing mission to provide you with exceptional heart care, we have created designated Provider Care Teams.  These Care Teams include your primary Cardiologist (physician) and Advanced Practice Providers (APPs -  Physician Assistants and Nurse Practitioners) who all work together to provide you with the care you need, when you need it.  We recommend signing up for the patient portal called  "MyChart".  Sign up information is provided on this After Visit Summary.  MyChart is used to connect with patients for Virtual Visits (Telemedicine).  Patients are able to view lab/test results, encounter notes, upcoming appointments, etc.  Non-urgent messages can be sent to your provider as well.   To learn more about what you can do with MyChart, go to [email or web address].    Your next appointment:   As needed  The format for your next appointment:   In Person  Provider:   You may see Dr. Daneen Schick or one of the following Advanced Practice Providers on your designated Care Team:    Truitt Merle, NP  Cecilie Kicks, NP  Kathyrn Drown, NP    Other Instructions  Dr. Tamala Julian spoke with you about autonomic neuropathy.  Your physician recommends that you wear moderate tension support stockings.        Signed, Sinclair Grooms, MD  03/13/2020 6:38 PM    [place]

## 2020-03-13 ENCOUNTER — Encounter: Payer: Self-pay | Admitting: Interventional Cardiology

## 2020-03-13 ENCOUNTER — Other Ambulatory Visit: Payer: Self-pay

## 2020-03-13 ENCOUNTER — Ambulatory Visit (INDEPENDENT_AMBULATORY_CARE_PROVIDER_SITE_OTHER): Payer: BC Managed Care – PPO | Admitting: Interventional Cardiology

## 2020-03-13 VITALS — BP 92/60 | HR 110 | Ht 72.0 in | Wt 153.0 lb

## 2020-03-13 DIAGNOSIS — E782 Mixed hyperlipidemia: Secondary | ICD-10-CM

## 2020-03-13 DIAGNOSIS — Z7189 Other specified counseling: Secondary | ICD-10-CM

## 2020-03-13 DIAGNOSIS — I951 Orthostatic hypotension: Secondary | ICD-10-CM

## 2020-03-13 DIAGNOSIS — E108 Type 1 diabetes mellitus with unspecified complications: Secondary | ICD-10-CM

## 2020-03-13 DIAGNOSIS — I5189 Other ill-defined heart diseases: Secondary | ICD-10-CM

## 2020-03-13 DIAGNOSIS — R0600 Dyspnea, unspecified: Secondary | ICD-10-CM

## 2020-03-13 DIAGNOSIS — R0609 Other forms of dyspnea: Secondary | ICD-10-CM

## 2020-03-13 DIAGNOSIS — D638 Anemia in other chronic diseases classified elsewhere: Secondary | ICD-10-CM | POA: Diagnosis not present

## 2020-03-13 DIAGNOSIS — I10 Essential (primary) hypertension: Secondary | ICD-10-CM

## 2020-03-13 NOTE — Patient Instructions (Addendum)
Medication Instructions:  Your physician recommends that you continue on your current medications as directed. Please refer to the Current Medication list given to you today.  *If you need a refill on your cardiac medications before your next appointment, please call your pharmacy*   Lab Work: None If you have labs (blood work) drawn today and your tests are completely normal, you will receive your results only by: Marland Kitchen MyChart Message (if you have MyChart) OR . A paper copy in the mail If you have any lab test that is abnormal or we need to change your treatment, we will call you to review the results.   Testing/Procedures: None   Follow-Up: At [facility], you and your health needs are our priority.  As part of our continuing mission to provide you with exceptional heart care, we have created designated Provider Care Teams.  These Care Teams include your primary Cardiologist (physician) and Advanced Practice Providers (APPs -  Physician Assistants and Nurse Practitioners) who all work together to provide you with the care you need, when you need it.  We recommend signing up for the patient portal called "MyChart".  Sign up information is provided on this After Visit Summary.  MyChart is used to connect with patients for Virtual Visits (Telemedicine).  Patients are able to view lab/test results, encounter notes, upcoming appointments, etc.  Non-urgent messages can be sent to your provider as well.   To learn more about what you can do with MyChart, go to [email or web address].    Your next appointment:   As needed  The format for your next appointment:   In Person  Provider:   You may see Dr. Daneen Schick or one of the following Advanced Practice Providers on your designated Care Team:    Truitt Merle, NP  Cecilie Kicks, NP  Kathyrn Drown, NP    Other Instructions  Dr. Tamala Julian spoke with you about autonomic neuropathy.  Your physician recommends that you wear moderate  tension support stockings.

## 2020-03-14 DIAGNOSIS — E119 Type 2 diabetes mellitus without complications: Secondary | ICD-10-CM | POA: Diagnosis not present

## 2020-03-15 ENCOUNTER — Telehealth: Payer: Self-pay | Admitting: Interventional Cardiology

## 2020-03-15 NOTE — Telephone Encounter (Signed)
Spoke with pt and advised per Dr. Johnsen Caul note, he did not want her to restart any of the medications due to orthostatic hypotension.  Pt made appt to be seen on 9/10.  Asked her about this since we told her to f/u PRN.  Pt states she prefers to see Dr. Tamala Julian instead of Dr. Terri Skains.  Advised I will find out from Dr. Tamala Julian when she needs to be seen.  Dr. Tamala Julian said ok to keep 9/10 appt so we can reevaluate swelling but pt needs to contact PCP and Dr. Buddy Duty for f/u as well.  Spoke with pt and made her aware.  She is reaching out to PCP today for an appt and and has one with Dr. Buddy Duty on the 18th.  Pt appreciative for assistance.

## 2020-03-15 NOTE — Telephone Encounter (Signed)
New Message  Patient is calling in to see if she is supposed to start back on her medications or wait until appointment on 04/14/20. States that it has been a full month. Please advise.

## 2020-03-20 ENCOUNTER — Other Ambulatory Visit: Payer: Self-pay | Admitting: Family Medicine

## 2020-03-20 DIAGNOSIS — E1040 Type 1 diabetes mellitus with diabetic neuropathy, unspecified: Secondary | ICD-10-CM

## 2020-03-20 NOTE — Telephone Encounter (Signed)
Requested medication (s) are due for refill today: Yes  Requested medication (s) are on the active medication list: Yes  Last refill:  04/23/19  Future visit scheduled: No  Notes to clinic:  Pharmacy asking for diagnosis code. Different provider listed as PCP.    Requested Prescriptions  Pending Prescriptions Disp Refills   TRESIBA FLEXTOUCH 200 UNIT/ML FlexTouch Pen [Pharmacy Med Name: TRESIBA FLEXTOUCH 200 UNIT/ML]  3    Sig: INJECT 36 UNITS INTO THE SKIN 2 TIMES DAILY.      Endocrinology:  Diabetes - Insulins Failed - 03/20/2020 12:35 PM      Failed - HBA1C is between 0 and 7.9 and within 180 days    Hgb A1c MFr Bld  Date Value Ref Range Status  10/12/2019 14.4 (H) 4.8 - 5.6 % Final    Comment:    (NOTE) Pre diabetes:          5.7%-6.4% Diabetes:              >6.4% Glycemic control for   <7.0% adults with diabetes           Failed - Valid encounter within last 6 months    Recent Outpatient Visits           11 months ago Need for immunization against influenza   [facility] [place], [place], [place]       Future Appointments             In 1 week [name], PA-C [facility] [place], [place]   In 3 weeks Tamala Julian, Lynnell Dike, MD [place], [facility]

## 2020-03-21 ENCOUNTER — Encounter: Payer: Self-pay | Admitting: Vascular Surgery

## 2020-03-21 ENCOUNTER — Ambulatory Visit: Payer: BC Managed Care – PPO | Admitting: Vascular Surgery

## 2020-03-21 ENCOUNTER — Ambulatory Visit (HOSPITAL_COMMUNITY)
Admission: RE | Admit: 2020-03-21 | Discharge: 2020-03-21 | Disposition: A | Discharge status: Home / Self Care | Payer: BC Managed Care – PPO | Source: Ambulatory Visit | Attending: Physician Assistant | Admitting: Physician Assistant

## 2020-03-21 ENCOUNTER — Other Ambulatory Visit: Payer: Self-pay

## 2020-03-21 VITALS — BP 130/80 | HR 100 | Temp 98.0°F | Resp 20 | Ht 72.0 in | Wt 158.0 lb

## 2020-03-21 DIAGNOSIS — L98499 Non-pressure chronic ulcer of skin of other sites with unspecified severity: Secondary | ICD-10-CM

## 2020-03-21 DIAGNOSIS — L97911 Non-pressure chronic ulcer of unspecified part of right lower leg limited to breakdown of skin: Secondary | ICD-10-CM | POA: Insufficient documentation

## 2020-03-21 NOTE — Progress Notes (Signed)
Vascular and Vein Specialist of [facility]  Patient name: [name] MRN: [id number] DOB: 08/18/1970 Sex: female  REASON FOR CONSULT: Evaluation ulcerations both lower extremities  HPI: [name] is a 49 y.o. female, who is here today for evaluation. She has 14-year history of insulin-dependent diabetes. She has had difficulty with lower extremity ulcerations for quite some time. These have been on her feet and now are on her right posterior calf extending up towards her knee. She has severe diabetic neuropathy in her hands and feet. She is seen today due to chronic ulcerations on both feet and her right calf. She does have leg swelling and has been wearing compression garments occasionally. No history of DVT.  Past Medical History:  Diagnosis Date   Arthritis    CHF (congestive heart failure) (HCC)    History of chicken pox    Migraines    Mood disorder (HCC)    anxiety   Prurigo nodularis    with diabetic dermopathy   Type 1 diabetes, uncontrolled, with neuropathy ([place])    [place]    Family History  Problem Relation Age of Onset   Diabetes Mother        type 2   Hypertension Mother    Thyroid disease Mother    Bipolar disorder Mother    Heart disease Mother    Calcium disorder Mother    Cancer Maternal Grandmother        Breast, stomach   Cancer Paternal Grandmother        stomach, lung (smoker)   Diabetes Paternal Grandmother    Diabetes Paternal Grandfather    Heart failure Sister    Diabetes Sister    Stroke Maternal Aunt    Cancer Maternal Uncle        prostate   CAD Maternal Aunt        stents   Cancer Maternal Aunt 64       ovarian    SOCIAL HISTORY: Social History   Socioeconomic History   Marital status: Married    Spouse name: Not on file   Number of children: 2   Years of education: Not on file   Highest education level: Not on file  Occupational History    Not on file  Tobacco Use   Smoking status: Never Smoker   Smokeless tobacco: Never Used  Vaping Use   Vaping Use: Never used  Substance and Sexual Activity   Alcohol use: Not Currently   Drug use: No   Sexual activity: Yes    Partners: Male    Birth control/protection: Pill  Other Topics Concern   Not on file  Social History Narrative   Caffeine use: daily   Occupation: cleans houses   Regular exercise: yes, active 5x/wk   Diet: good water, fruits/vegetables daily   Social Determinants of Health   Financial Resource Strain:    Difficulty of Paying Living Expenses:   Food Insecurity:    Worried About Charity fundraiser in the Last Year:    Arboriculturist in the Last Year:   Transportation Needs:    Film/video editor (Medical):    Lack of Transportation (Non-Medical):   Physical Activity:    Days of Exercise per Week:    Minutes of Exercise per Session:   Stress:    Feeling of Stress :   Social Connections:    Frequency of Communication with Friends and Family:    Frequency of Social Gatherings with Friends  and Family:    Attends Religious Services:    Active Member of Clubs or Organizations:    Attends Archivist Meetings:    Marital Status:   Intimate Partner Violence:    Fear of Current or Ex-Partner:    Emotionally Abused:    Physically Abused:    Sexually Abused:     Allergies  Allergen Reactions   Amoxicillin Itching   Lantus [Insulin Glargine] Itching and Rash    Current Outpatient Medications  Medication Sig Dispense Refill   acetaminophen (TYLENOL) 500 MG tablet Take 500-1,000 mg by mouth every 6 (six) hours as needed for mild pain or headache.      albuterol (PROAIR HFA) 108 (90 Base) MCG/ACT inhaler Inhale 2 puffs into the lungs every 4 (four) hours as needed for wheezing or shortness of breath.      blood glucose meter kit and supplies KIT Dispense based on patient and insurance preference. Use up to  four times daily as directed. (FOR ICD-10 E10.65). 1 each 0   insulin aspart (NOVOLOG FLEXPEN) 100 UNIT/ML FlexPen Inject 9 Units into the skin 3 (three) times daily with meals. 5 pen 1   insulin degludec (TRESIBA FLEXTOUCH) 100 UNIT/ML FlexTouch Pen Inject 0.5 mLs (50 Units total) into the skin at bedtime. 10 pen 1   Insulin Pen Needle (PEN NEEDLES) 31G X 5 MM MISC 1 each by Does not apply route 6 (six) times daily. 200 each 11   Lancets (ACCU-CHEK SOFT TOUCH) lancets Check 3-4 times daily for uncontrolled insulin dependent type 1 diabetes 100 each 12   olopatadine (PATADAY) 0.1 % ophthalmic solution Place 1 drop into both eyes daily as needed for allergies.     promethazine (PHENERGAN) 25 MG tablet Take 25 mg by mouth every 4 (four) hours as needed for nausea or vomiting.     Psyllium (METAMUCIL PO) Take 1 packet by mouth daily as needed (Constipation). Mix with water      senna-docusate (SENOKOT-S) 8.6-50 MG tablet Take 1 tablet by mouth 2 (two) times daily between meals as needed for mild constipation. 180 tablet 1   No current facility-administered medications for this visit.    REVIEW OF SYSTEMS:  Negative aside from past history above  PHYSICAL EXAM: Vitals:   03/21/20 0922  BP: 130/80  Pulse: 100  Resp: 20  Temp: 98 F (36.7 C)  SpO2: 97%  Weight: 158 lb (71.7 kg)  Height: 6' (1.829 m)    GENERAL: The patient is a well-nourished female, in no acute distress. The vital signs are documented above. CARDIOVASCULAR: Easily palpable dorsalis pedis pulses bilaterally PULMONARY: There is good air exchange  ABDOMEN: Soft and non-tender  MUSCULOSKELETAL: There are no major deformities or cyanosis. NEUROLOGIC: No focal weakness or paresthesias are detected. SKIN: Very unusual pattern of healed ulcerations with scarring on both feet. She does have hemosiderin deposits and thickening of her right leg from her ankle to her knee. This is most significant on the lateral and  posterior aspect PSYCHIATRIC: The patient has a normal affect.  DATA:  Normal lower extremity arterial study with normal ankle arm index and waveforms bilaterally  MEDICAL ISSUES: Discussed these findings at length with the patient. I do not see any evidence of significant arterial pathology. She does have some venous congestion by physical exam and I explained the critical importance of wearing compression garments.  She may require specific dermatologic evaluation. I am unclear as to the cause of her skin changes but it  does not appear to be related to arterial or venous pathology she will see [place] again on an as-needed basis   Rosetta Posner, MD [facility] V[facility] and Ve[facility] Specialists of [facility] [address] Tel [phone] Pager [phone]

## 2020-03-22 ENCOUNTER — Telehealth: Payer: Self-pay | Admitting: Interventional Cardiology

## 2020-03-22 ENCOUNTER — Other Ambulatory Visit: Payer: Self-pay | Admitting: Cardiology

## 2020-03-22 DIAGNOSIS — Z794 Long term (current) use of insulin: Secondary | ICD-10-CM | POA: Diagnosis not present

## 2020-03-22 DIAGNOSIS — E1165 Type 2 diabetes mellitus with hyperglycemia: Secondary | ICD-10-CM | POA: Diagnosis not present

## 2020-03-22 DIAGNOSIS — R072 Precordial pain: Secondary | ICD-10-CM

## 2020-03-22 DIAGNOSIS — E049 Nontoxic goiter, unspecified: Secondary | ICD-10-CM | POA: Diagnosis not present

## 2020-03-22 NOTE — Telephone Encounter (Signed)
Spoke with the patient who states that she has been having some swelling on and off, but this week it has gotten worse. She is having swelling in her legs, feet and fingers. She denies any SOB or chest pain. She states that she does get fatigued easily however this is her baseline. She continues to have issues with orthostatic hypotension and dizziness upon standing.  She went to one doctor's office yesterday and she weighed 158lb and at another office today she was 162lb. Overall she thinks she has gained about 8-10 pounds over the past few days.  She has been using her compression stockings which she states are miserable. She also elevates her legs as often as she can.  Many of her medications have been discontinued due to side effects so she is not currently on a diuretic.  Patient saw Dr. Tamala Julian on 08/09 and was advised to contact him if she had any fluid retention. I advised patient I would sent a note to Dr. Tamala Julian and his nurse for recommendations.

## 2020-03-22 NOTE — Telephone Encounter (Signed)
Has the dizziness and lightheadedness with standing improved? If yes, it means that volume depletion has resolved. There will not be a perfect happy medium due to the nerve damage from DM . If we start the diuretic back, the dizzness with eventuall recur. Try Demadex 20 mg on M, W, F. If dizzeness worsens, let us know.

## 2020-03-22 NOTE — Telephone Encounter (Signed)
Pt c/o swelling: STAT is pt has developed SOB within 24 hours  1) How much weight have you gained and in what time span? 10 lbs in less than 24 hrs with compressions on   2) If swelling, where is the swelling located? All over, definitely in legs and fingers   3) Are you currently taking a fluid pill? No   4) Are you currently SOB? No (is short winded, but states this is a normal occurrence that always happens not due to swelling)   5) Do you have a log of your daily weights (if so, list)?  03/21/20 152 lbs        03/22/20 162 lbs   6) Have you gained 3 pounds in a day or 5 pounds in a week? Yes   7) Have you traveled recently? No   [name] states she cannot get her rings off and and she is miserable with the compressions on. Please advise.

## 2020-03-23 MED ORDER — TORSEMIDE 20 MG PO TABS: 20.0000 mg | ORAL_TABLET | ORAL | 3 refills | Status: DC

## 2020-03-23 NOTE — Telephone Encounter (Signed)
Spoke with pt and she states lightheadedness and dizziness with standing is no better.  Pt agreeable to try Torsemide.  Advised pt to call if dizziness worsens.  Pt verbalized understanding and was in agreement with this plan.

## 2020-03-27 ENCOUNTER — Encounter: Payer: Self-pay | Admitting: Physician Assistant

## 2020-03-27 ENCOUNTER — Ambulatory Visit: Payer: BC Managed Care – PPO | Admitting: Physician Assistant

## 2020-03-27 ENCOUNTER — Other Ambulatory Visit: Payer: Self-pay

## 2020-03-27 VITALS — BP 130/80 | HR 94 | Temp 97.8°F | Resp 16 | Ht 71.0 in | Wt 171.0 lb

## 2020-03-27 DIAGNOSIS — R5382 Chronic fatigue, unspecified: Secondary | ICD-10-CM | POA: Diagnosis not present

## 2020-03-27 DIAGNOSIS — R21 Rash and other nonspecific skin eruption: Secondary | ICD-10-CM

## 2020-03-27 DIAGNOSIS — E1065 Type 1 diabetes mellitus with hyperglycemia: Secondary | ICD-10-CM

## 2020-03-27 DIAGNOSIS — R0602 Shortness of breath: Secondary | ICD-10-CM

## 2020-03-27 DIAGNOSIS — I5032 Chronic diastolic (congestive) heart failure: Secondary | ICD-10-CM

## 2020-03-27 DIAGNOSIS — D649 Anemia, unspecified: Secondary | ICD-10-CM

## 2020-03-27 LAB — CBC WITH DIFFERENTIAL/PLATELET
Basophils Absolute: 0 10*3/uL (ref 0.0–0.1)
Basophils Relative: 0.4 % (ref 0.0–3.0)
Eosinophils Absolute: 0.1 10*3/uL (ref 0.0–0.7)
Eosinophils Relative: 1.2 % (ref 0.0–5.0)
HCT: 27.1 % — ABNORMAL LOW (ref 36.0–46.0)
Hemoglobin: 9 g/dL — ABNORMAL LOW (ref 12.0–15.0)
Lymphocytes Relative: 35.5 % (ref 12.0–46.0)
Lymphs Abs: 1.6 10*3/uL (ref 0.7–4.0)
MCHC: 33.1 g/dL (ref 30.0–36.0)
MCV: 85.1 fl (ref 78.0–100.0)
Monocytes Absolute: 0.2 10*3/uL (ref 0.1–1.0)
Monocytes Relative: 4.5 % (ref 3.0–12.0)
Neutro Abs: 2.7 10*3/uL (ref 1.4–7.7)
Neutrophils Relative %: 58.4 % (ref 43.0–77.0)
Platelets: 240 10*3/uL (ref 150.0–400.0)
RBC: 3.19 Mil/uL — ABNORMAL LOW (ref 3.87–5.11)
RDW: 15.2 % (ref 11.5–15.5)
WBC: 4.6 10*3/uL (ref 4.0–10.5)

## 2020-03-27 LAB — COMPREHENSIVE METABOLIC PANEL
ALT: 10 U/L (ref 0–35)
AST: 12 U/L (ref 0–37)
Albumin: 2.9 g/dL — ABNORMAL LOW (ref 3.5–5.2)
Alkaline Phosphatase: 57 U/L (ref 39–117)
BUN: 26 mg/dL — ABNORMAL HIGH (ref 6–23)
CO2: 26 mEq/L (ref 19–32)
Calcium: 8.6 mg/dL (ref 8.4–10.5)
Chloride: 110 mEq/L (ref 96–112)
Creatinine, Ser: 0.66 mg/dL (ref 0.40–1.20)
GFR: 114.86 mL/min (ref >60.00)
Glucose, Bld: 97 mg/dL (ref 70–99)
Potassium: 4.3 mEq/L (ref 3.5–5.1)
Sodium: 141 mEq/L (ref 135–145)
Total Bilirubin: 0.7 mg/dL (ref 0.2–1.2)
Total Protein: 5.6 g/dL — ABNORMAL LOW (ref 6.0–8.3)

## 2020-03-27 LAB — HIGH SENSITIVITY CRP: CRP, High Sensitivity: 1.02 mg/L (ref 0.000–5.000)

## 2020-03-27 LAB — TSH: TSH: 2 u[IU]/mL (ref 0.35–4.50)

## 2020-03-27 NOTE — Progress Notes (Signed)
Patient presents to clinic today to establish care.  Patient was encouraged to establish with a new PCP by her Cardiologist giving concern over previous care. She has known history of DM I (Followed by Endocrinology -- Dr. Buddy Duty), HTN and HF (Followed by Cardiology - Dr. Tamala Julian), Diabetic Neuropathy, Chronic dyspnea (Followed by Pulmonology -- Dr. Ander Slade) and mood disorder, among others. Has been having ongoing issue with shortness of breath, most recently having workup with Cardiology including Arterial US and Echocardiogram (scheduled). Was noted to have history of anemia with hgb at 9.1 that has not been fully evaluated. Has noted fatigue and windedness with exertion. Denies chest pain. Has noted some lightheadedness with positional change. Denies fever, chills. Glucose has been highly fluctuant - she is waiting on her pump from Endo. Of note she does note prior history of peptic ulcer which she was seen by GI for but ended up having to have acute assessment of her heart so this was put on the back burner. Denies any current rectal bleeding or melena.    Appt with reintal specialist -- was seeing [facility] -- now set up with a retinal specialist. Will be seeing this week.     Past Medical History:  Diagnosis Date  . Anemia   . Anxiety   . Arthritis   . Asthma   . CHF (congestive heart failure) ([place])   . Depression   . History of chicken pox   . Migraines   . Mood disorder ([facility])    anxiety  . Prurigo nodularis    with diabetic dermopathy  . Type 1 diabetes, uncontrolled, with neuropathy ([place])    [place]    Past Surgical History:  Procedure Laterality Date  . treadmill stress test  01/2013   WNL, low risk study    Current Outpatient Medications on File Prior to Visit  Medication Sig Dispense Refill  . albuterol (PROAIR HFA) 108 (90 Base) MCG/ACT inhaler Inhale 2 puffs into the lungs every 4 (four) hours as needed for wheezing or shortness of breath.     . Continuous Blood Gluc  Sensor (FREESTYLE LIBRE 2 SENSOR) MISC CHANGE EVERY 14 DAYS    . insulin aspart (NOVOLOG FLEXPEN) 100 UNIT/ML FlexPen Inject 9 Units into the skin 3 (three) times daily with meals. (Patient taking differently: Inject into the skin 3 (three) times daily with meals. 12-22 units) 5 pen 1  . insulin degludec (TRESIBA FLEXTOUCH) 100 UNIT/ML FlexTouch Pen Inject 0.5 mLs (50 Units total) into the skin at bedtime. (Patient taking differently: Inject 56 Units into the skin at bedtime. ) 10 pen 1  . Insulin Pen Needle (PEN NEEDLES) 31G X 5 MM MISC 1 each by Does not apply route 6 (six) times daily. 200 each 11  . LORazepam (ATIVAN) 0.5 MG tablet Take 0.5 mg by mouth every 8 (eight) hours as needed for anxiety.    . torsemide (DEMADEX) 20 MG tablet Take 1 tablet (20 mg total) by mouth every Monday, Wednesday, and Friday. 45 tablet 3   No current facility-administered medications on file prior to visit.    Allergies  Allergen Reactions  . Amoxicillin Itching  . Lantus [Insulin Glargine] Itching and Rash    Family History  Problem Relation Age of Onset  . Diabetes Mother        type 2  . Hypertension Mother   . Thyroid disease Mother   . Bipolar disorder Mother   . Heart disease Mother   . Calcium disorder Mother   .  Cancer Maternal Grandmother        Breast, stomach  . Cancer Paternal Grandmother        stomach, lung (smoker)  . Diabetes Paternal Grandmother   . Diabetes Paternal Grandfather   . Heart failure Sister   . Diabetes Sister   . Stroke Maternal Aunt   . Cancer Maternal Uncle        prostate  . CAD Maternal Aunt        stents  . Cancer Maternal Aunt 64       ovarian    Social History   Socioeconomic History  . Marital status: Married    Spouse name: Not on file  . Number of children: 2  . Years of education: Not on file  . Highest education level: Not on file  Occupational History  . Not on file  Tobacco Use  . Smoking status: Never Smoker  . Smokeless tobacco:  Never Used  Vaping Use  . Vaping Use: Never used  Substance and Sexual Activity  . Alcohol use: Not Currently  . Drug use: No  . Sexual activity: Yes    Partners: Male    Birth control/protection: None  Other Topics Concern  . Not on file  Social History Narrative   Caffeine use: daily   Occupation: cleans houses   Regular exercise: yes, active 5x/wk   Diet: good water, fruits/vegetables daily   Social Determinants of Health   Financial Resource Strain:   . Difficulty of Paying Living Expenses: Not on file  Food Insecurity:   . Worried About Charity fundraiser in the Last Year: Not on file  . Ran Out of Food in the Last Year: Not on file  Transportation Needs:   . Lack of Transportation (Medical): Not on file  . Lack of Transportation (Non-Medical): Not on file  Physical Activity:   . Days of Exercise per Week: Not on file  . Minutes of Exercise per Session: Not on file  Stress:   . Feeling of Stress : Not on file  Social Connections:   . Frequency of Communication with Friends and Family: Not on file  . Frequency of Social Gatherings with Friends and Family: Not on file  . Attends Religious Services: Not on file  . Active Member of Clubs or Organizations: Not on file  . Attends Archivist Meetings: Not on file  . Marital Status: Not on file  Intimate Partner Violence:   . Fear of Current or Ex-Partner: Not on file  . Emotionally Abused: Not on file  . Physically Abused: Not on file  . Sexually Abused: Not on file   ROS Pertinent ROS are listed in the HPI.   Resp 16   Ht '5\' 11"'  (1.803 m)   Wt 171 lb (77.6 kg)   BMI 23.85 kg/m   Physical Exam Vitals reviewed.  Constitutional:      Appearance: Normal appearance.  HENT:     Head: Normocephalic and atraumatic.     Mouth/Throat:     Mouth: Mucous membranes are moist.  Eyes:     Conjunctiva/sclera: Conjunctivae normal.  Cardiovascular:     Rate and Rhythm: Normal rate and regular rhythm.      Pulses: Normal pulses.     Heart sounds: Normal heart sounds.  Pulmonary:     Effort: Pulmonary effort is normal.     Breath sounds: Normal breath sounds.  Abdominal:     General: Bowel sounds are normal. There is  no distension.     Palpations: Abdomen is soft.     Tenderness: There is no abdominal tenderness.  Musculoskeletal:     Cervical back: Neck supple.  Skin:    Comments: Significant scarring of lower extremities bilaterally from prior ulcers. No active ulceration. No active edema noted on examination.  Neurological:     General: No focal deficit present.     Mental Status: She is alert and oriented to person, place, and time.  Psychiatric:        Mood and Affect: Mood normal.     Recent Results (from the past 2160 hour(s))  Pro b natriuretic peptide (BNP)[id number])     Status: Abnormal   Collection Time: 01/06/20  9:54 AM  Result Value Ref Range   NT-Pro BNP 773 (H) 0 - 249 pg/mL    Comment: The following cut-points have been suggested for the use of proBNP for the diagnostic evaluation of heart failure (HF) in patients with acute dyspnea: Modality                     Age           Optimal Cut                            (years)            Point ------------------------------------------------------ Diagnosis (rule in HF)        <50            450 pg/mL                           50 - 75            900 pg/mL                               >75           1800 pg/mL Exclusion (rule out HF)  Age independent     300 pg/mL   Magnesium     Status: None   Collection Time: 01/06/20  9:54 AM  Result Value Ref Range   Magnesium 2.1 1.6 - 2.3 mg/dL  Basic metabolic panel     Status: None   Collection Time: 01/06/20  9:54 AM  Result Value Ref Range   Glucose 97 65 - 99 mg/dL   BUN 13 6 - 24 mg/dL   Creatinine, Ser 0.79 0.57 - 1.00 mg/dL   GFR calc non Af Amer 88 >59 mL/min/1.73   GFR calc Af Amer 102 >59 mL/min/1.73    Comment: **[facility] currently  reports eGFR in compliance with the current**   recommendations of the [employer]. [facility] will   update reporting as new guidelines are published from the NKF-ASN   Task force.    BUN/Creatinine Ratio 16 9 - 23   Sodium 139 134 - 144 mmol/L   Potassium 5.2 3.5 - 5.2 mmol/L   Chloride 105 96 - 106 mmol/L   CO2 24 20 - 29 mmol/L   Calcium 8.8 8.7 - 10.2 mg/dL  SARS CORONAVIRUS 2 (TAT 6-24 HRS) Nasopharyngeal Nasopharyngeal Swab     Status: None   Collection Time: 01/07/20 10:55 AM   Specimen: Nasopharyngeal Swab  Result Value Ref Range   SARS Coronavirus 2 NEGATIVE NEGATIVE    Comment: (  NOTE) SARS-CoV-2 target nucleic acids are NOT DETECTED. The SARS-CoV-2 RNA is generally detectable in upper and lower respiratory specimens during the acute phase of infection. Negative results do not preclude SARS-CoV-2 infection, do not rule out co-infections with other pathogens, and should not be used as the sole basis for treatment or other patient management decisions. Negative results must be combined with clinical observations, patient history, and epidemiological information. The expected result is Negative. Fact Sheet for Patients: SugarRoll.be Fact Sheet for Healthcare Providers: https://www.woods-mathews.com/ This test is not yet approved or cleared by the [place] FDA and  has been authorized for detection and/or diagnosis of SARS-CoV-2 by FDA under an Emergency Use Authorization (EUA). This EUA will remain  in effect (meaning this test can be used) for the duration of the COVID-19 declaration under Section 56 4(b)(1) of the Act, 21 U.S.C. section 360bbb-3(b)(1), unless the authorization is terminated or revoked sooner. Performed at [facility], [place] [address], [place], [place] [address]   Glucose, capillary     Status: None   Collection Time: 01/12/20  7:55 AM  Result Value Ref Range   Glucose-Capillary 75 70 -  99 mg/dL    Comment: Glucose reference range applies only to samples taken after fasting for at least 8 hours.  Glucose, capillary     Status: Abnormal   Collection Time: 01/12/20  8:25 AM  Result Value Ref Range   Glucose-Capillary 101 (H) 70 - 99 mg/dL    Comment: Glucose reference range applies only to samples taken after fasting for at least 8 hours.  Basic metabolic panel     Status: Abnormal   Collection Time: 01/14/20  9:58 AM  Result Value Ref Range   Glucose 230 (H) 65 - 99 mg/dL   BUN 13 6 - 24 mg/dL   Creatinine, Ser 0.77 0.57 - 1.00 mg/dL   GFR calc non Af Amer 91 >59 mL/min/1.73   GFR calc Af Amer 105 >59 mL/min/1.73    Comment: **[facility] currently reports eGFR in compliance with the current**   recommendations of the [employer]. [facility] will   update reporting as new guidelines are published from the NKF-ASN   Task force.    BUN/Creatinine Ratio 17 9 - 23   Sodium 141 134 - 144 mmol/L   Potassium 4.4 3.5 - 5.2 mmol/L   Chloride 108 (H) 96 - 106 mmol/L   CO2 21 20 - 29 mmol/L   Calcium 8.3 (L) 8.7 - 10.2 mg/dL  Magnesium     Status: None   Collection Time: 01/14/20  9:58 AM  Result Value Ref Range   Magnesium 2.3 1.6 - 2.3 mg/dL  Pregnancy, urine     Status: None   Collection Time: 01/14/20  9:58 AM  Result Value Ref Range   Preg Test, Ur Negative Negative  Basic metabolic panel     Status: Abnormal   Collection Time: 01/31/20 10:08 AM  Result Value Ref Range   Glucose 244 (H) 65 - 99 mg/dL   BUN 22 6 - 24 mg/dL   Creatinine, Ser 0.79 0.57 - 1.00 mg/dL   GFR calc non Af Amer 88 >59 mL/min/1.73   GFR calc Af Amer 102 >59 mL/min/1.73    Comment: **[facility] currently reports eGFR in compliance with the current**   recommendations of the [employer]. [facility] will   update reporting as new guidelines are published from the NKF-ASN   Task force.    BUN/Creatinine Ratio 28 (H)  9 - 23   Sodium 138 134 - 144 mmol/L   Potassium 4.7 3.5  - 5.2 mmol/L   Chloride 107 (H) 96 - 106 mmol/L   CO2 21 20 - 29 mmol/L   Calcium 8.5 (L) 8.7 - 10.2 mg/dL  Lipid Panel With LDL/HDL Ratio     Status: Abnormal   Collection Time: 01/31/20 10:08 AM  Result Value Ref Range   Cholesterol, Total 205 (H) 100 - 199 mg/dL   Triglycerides 70 0 - 149 mg/dL   HDL 73 >39 mg/dL   VLDL Cholesterol Cal 13 5 - 40 mg/dL   LDL Chol Calc (NIH) 119 (H) 0 - 99 mg/dL   LDL/HDL Ratio 1.6 0.0 - 3.2 ratio    Comment:                                     LDL/HDL Ratio                                             Men  Women                               1/2 Avg.Risk  1.0    1.5                                   Avg.Risk  3.6    3.2                                2X Avg.Risk  6.2    5.0                                3X Avg.Risk  8.0    6.1   Pro b natriuretic peptide (BNP)[id number])     Status: Abnormal   Collection Time: 01/31/20 10:08 AM  Result Value Ref Range   NT-Pro BNP 1,696 (H) 0 - 249 pg/mL    Comment: The following cut-points have been suggested for the use of proBNP for the diagnostic evaluation of heart failure (HF) in patients with acute dyspnea: Modality                     Age           Optimal Cut                            (years)            Point ------------------------------------------------------ Diagnosis (rule in HF)        <50            450 pg/mL                           50 - 75            900 pg/mL                               >  75           1800 pg/mL Exclusion (rule out HF)  Age independent     300 pg/mL   Magnesium     Status: None   Collection Time: 01/31/20 10:08 AM  Result Value Ref Range   Magnesium 2.1 1.6 - 2.3 mg/dL    Assessment/Plan: 1. SOBOE (shortness of breath on exertion) 2. 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 Needs further evaluation including CBC, iron panel and FIT. Will also assess TSH, CMP,  CRP and ANA to assess further causes.  - Antinuclear Antib (ANA) - CRP High sensitivity - TSH - Comp Met (CMET) - CBC with Differential/Platelet - Fecal occult blood, imunochemical  3. Chronic anemia Repeat CBC and iron panel to ensure stability. If declined further will need STAT appointment with Gastroenterology. Will have her bring in FIT test ASAP.  - CBC with Differential/Platelet - Iron, TIBC and Ferritin Panel - Fecal occult blood, imunochemical  4. Skin rash Ongoing issues after prior extremity ulcerations. Referral placed per patient request.  - Ambulatory referral to Dermatology  5. Chronic heart failure with preserved ejection fraction [facility]) Managed by Cardiology. Is currently euvolemic on examination. Continue management per specialist.   6. Uncontrolled type 1 diabetes mellitus with hyperglycemia, with long-term current use of insulin ([place]) Continue management per Endocrinology.  This visit occurred during the SARS-CoV-2 public health emergency.  Safety protocols were in place, including screening questions prior to the visit, additional usage of staff PPE, and extensive cleaning of exam room while observing appropriate contact time as indicated for disinfecting solutions.     [name], PA-C

## 2020-03-27 NOTE — Patient Instructions (Signed)
Please go to the lab today for blood work.  I will call you with your results. We will alter treatment regimen(s) if indicated by your results.   Please complete the stool FIT test ASAP and return.  Keep well-hydrated. Try to keep a well-balanced but bland diet.   We will work on getting to the bottom of this. I am concerned that anemia noted previously has worsened and this is further exacerbating other conditions.   If you note any acutely worsening symptoms from this point forward despite my and Cardiology's recommendations/changes, I want you to be seen in the ER.   I have completed your handicap form.  I will let you know as soon as I have finished short-term disability paperwork.   We will schedule follow-up when I call with results.

## 2020-03-28 LAB — IRON,TIBC AND FERRITIN PANEL
%SAT: 29 % (calc) (ref 16–45)
Ferritin: 16 ng/mL (ref 16–232)
Iron: 84 ug/dL (ref 40–190)
TIBC: 292 mcg/dL (calc) (ref 250–450)

## 2020-03-28 LAB — ANA: Anti Nuclear Antibody (ANA): NEGATIVE

## 2020-03-29 ENCOUNTER — Encounter: Payer: Self-pay | Admitting: Physician Assistant

## 2020-03-29 ENCOUNTER — Ambulatory Visit: Payer: BC Managed Care – PPO | Admitting: Pulmonary Disease

## 2020-03-29 ENCOUNTER — Other Ambulatory Visit: Payer: Self-pay

## 2020-03-29 ENCOUNTER — Ambulatory Visit (INDEPENDENT_AMBULATORY_CARE_PROVIDER_SITE_OTHER): Payer: BC Managed Care – PPO

## 2020-03-29 ENCOUNTER — Encounter: Payer: Self-pay | Admitting: Pulmonary Disease

## 2020-03-29 VITALS — BP 136/82 | HR 109 | Temp 97.5°F | Ht 72.0 in | Wt 171.2 lb

## 2020-03-29 DIAGNOSIS — R0600 Dyspnea, unspecified: Secondary | ICD-10-CM

## 2020-03-29 DIAGNOSIS — J811 Chronic pulmonary edema: Secondary | ICD-10-CM | POA: Diagnosis not present

## 2020-03-29 DIAGNOSIS — E113513 Type 2 diabetes mellitus with proliferative diabetic retinopathy with macular edema, bilateral: Secondary | ICD-10-CM | POA: Diagnosis not present

## 2020-03-29 NOTE — Progress Notes (Signed)
[name]    [id number]    1971/06/20  Primary Care Physician:Martin, Luanna Cole, PA-C  Referring Physician: Shanon Rosser, PA-C [place],  [place] [id number]  Chief complaint:   Patient being seen for shortness of breath  HPI:  Had a severe illness in March for which she was hospitalized At that time was treated for respiratory failure and heart failure Has had difficulty since then  Uncontrolled hypertension Orthostasis for which she had to be taken off antihypertensive and she is now currently resuming antihypertensives  Cough that lasted a couple of months that updated following being taken off some of the medications she was on before  Does not sleep well at night Very fatigued during the day and sometimes takes naps Mind is also very active at night because of ongoing medical problems  Diabetes status difficult to control Better control recently  Severely deconditioned with inability to walk even a block Needs a cane to walk  No prior history of lung disease was told that she may have asthma about a year ago Inhalers did not help  Never smoker Worked in an office environment, 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    Continuous Blood Gluc Sensor (FREESTYLE LIBRE 2 SENSOR) MISC CHANGE EVERY 14 DAYS   insulin aspart (NOVOLOG FLEXPEN) 100 UNIT/ML FlexPen Inject 9 Units into the skin 3 (three) times daily with meals. (Patient taking differently: Inject into the skin 3 (three) times daily with meals. 12-22 units)   insulin degludec (TRESIBA FLEXTOUCH) 100 UNIT/ML  FlexTouch Pen Inject 0.5 mLs (50 Units total) into the skin at bedtime. (Patient taking differently: Inject 56 Units into the skin at bedtime. )   Insulin Pen Needle (PEN NEEDLES) 31G X 5 MM MISC 1 each by Does not apply route 6 (six) times daily.   LORazepam (ATIVAN) 0.5 MG tablet Take 0.5 mg by mouth every 8 (eight) hours as needed for anxiety.   torsemide (DEMADEX) 20 MG tablet Take 1 tablet (20 mg total) by mouth every Monday, Wednesday, and Friday.   No facility-administered encounter medications on file as of 03/29/2020.    Allergies as of 03/29/2020 - Review Complete 03/29/2020  Allergen Reaction Noted   Amoxicillin Itching 11/25/2017   Lantus [insulin glargine] Itching and Rash 11/25/2017    Past Medical History:  Diagnosis Date   Anemia    Anxiety    Arthritis    Asthma    CHF (congestive heart failure) (HCC)    Depression    History of chicken pox    Migraines    Mood disorder (HCC)    anxiety   Prurigo nodularis    with diabetic dermopathy   Type 1 diabetes, uncontrolled, with neuropathy ([place])    [place]    Past Surgical History:  Procedure Laterality Date   treadmill stress test  01/2013   WNL, low risk study    Family History  Problem Relation Age of Onset   Diabetes Mother        type 2   Hypertension Mother    Thyroid  disease Mother    Bipolar disorder Mother    Heart disease Mother    Calcium disorder Mother    Cancer Maternal Grandmother        Breast, stomach   Cancer Paternal Grandmother        stomach, lung (smoker)   Diabetes Paternal Grandmother    Diabetes Paternal Grandfather    Heart failure Sister    Diabetes Sister    Stroke Maternal Aunt    Cancer Maternal Uncle        prostate   CAD Maternal Aunt        stents   Cancer Maternal Aunt 64       ovarian    Social History   Socioeconomic History   Marital status: Married    Spouse name: Not on file   Number of children: 2   Years of  education: Not on file   Highest education level: Not on file  Occupational History   Not on file  Tobacco Use   Smoking status: Never Smoker   Smokeless tobacco: Never Used  Vaping Use   Vaping Use: Never used  Substance and Sexual Activity   Alcohol use: Not Currently   Drug use: No   Sexual activity: Yes    Partners: Male    Birth control/protection: None  Other Topics Concern   Not on file  Social History Narrative   Caffeine use: daily   Occupation: cleans houses   Regular exercise: yes, active 5x/wk   Diet: good water, fruits/vegetables daily   Social Determinants of Health   Financial Resource Strain:    Difficulty of Paying Living Expenses: Not on file  Food Insecurity:    Worried About Charity fundraiser in the Last Year: Not on file   [employer] of Food in the Last Year: Not on file  Transportation Needs:    Lack of Transportation (Medical): Not on file   Lack of Transportation (Non-Medical): Not on file  Physical Activity:    Days of Exercise per Week: Not on file   Minutes of Exercise per Session: Not on file  Stress:    Feeling of Stress : Not on file  Social Connections:    Frequency of Communication with Friends and Family: Not on file   Frequency of Social Gatherings with Friends and Family: Not on file   Attends Religious Services: Not on file   Active Member of Clubs or Organizations: Not on file   Attends Archivist Meetings: Not on file   Marital Status: Not on file  Intimate Partner Violence:    Fear of Current or Ex-Partner: Not on file   Emotionally Abused: Not on file   Physically Abused: Not on file   Sexually Abused: Not on file    Review of Systems  Constitutional: Positive for fatigue.  Respiratory: Positive for shortness of breath.   Cardiovascular: Positive for leg swelling.  Musculoskeletal: Positive for gait problem.  Neurological: Positive for weakness and light-headedness.    Vitals:    03/29/20 0910  BP: 136/82  Pulse: (!) 109  Temp: (!) 97.5 F (36.4 C)  SpO2: 99%     Physical Exam Constitutional:      Appearance: Normal appearance.  HENT:     Head: Normocephalic.     Nose: No congestion.     Mouth/Throat:     Mouth: Mucous membranes are moist.     Pharynx: No oropharyngeal exudate or posterior oropharyngeal erythema.  Eyes:  Pupils: Pupils are equal, round, and reactive to light.  Cardiovascular:     Rate and Rhythm: Normal rate and regular rhythm.     Pulses: Normal pulses.     Heart sounds: No murmur heard.  No friction rub.  Pulmonary:     Effort: Pulmonary effort is normal. No respiratory distress.     Breath sounds: Normal breath sounds. No stridor. No wheezing, rhonchi or rales.  Abdominal:     General: Abdomen is flat.  Musculoskeletal:        General: No swelling.     Cervical back: No rigidity or tenderness.     Comments: Multiple lower extremity scars  Skin:    General: Skin is warm.  Neurological:     Mental Status: She is alert.  Psychiatric:        Mood and Affect: Mood normal.    Data Reviewed: Echocardiogram 12/06/2019-ejection fraction 50 to 55%, grade 3 restrictive diastolic dysfunction, elevated left atrial pressure, severely dilated left atrum, mild tricuspid regurg, mild pulmonic regurg  Chest x-ray in May-pulmonary congestion-CT chest was recommended at the time -Questionable pneumonitis versus fibrosis-actual film not available for me to review  Assessment:  Shortness of breath -Not really explainable by any underlying lung disease -Questionable history of asthma in the last year -Did not respond to inhalers  Chronic cough -This resolved with changes in medications -Likely medication induced  Severe deconditioning -Has just never really bounced back since severe illness in March -I believe this is continuing as she has not been able to be very active -Severe muscle dysfunction likely contributing to her shortness  of breath as well  Hypertension -Difficulty controlling blood pressure with orthostasis -This is being worked on currently  Diastolic heart failure history  Abnormal echocardiogram with left atrial dilatation, right-sided valve abnormalities    Plan/Recommendations: Obtain an echocardiogram  Obtain a chest x-ray  Repeat echocardiogram with particular attention to pulmonary pressures -If pulmonary pressures are elevated then she may need a right heart catheterization to accurately measure pressures  Graded exercises as tolerated needs to be instituted I do believe significant muscle dysfunction and deconditioning is playing a role in current symptomatology  I am not starting on any inhalers at present until PFTs -No underlying lung disease    Sherrilyn Rist MD  Pulmonary and Critical Care 03/29/2020, 9:33 AM  CC: Long, [name], PA-C

## 2020-03-29 NOTE — Patient Instructions (Signed)
Shortness of breath History of diastolic heart failure Abnormal chest x-ray recently Severe deconditioning  Obtain a chest x-ray Obtain a pulmonary function test We will repeat an ultrasound of your heart to look for increase pressures in the blood vessels in the lungs-this was not mentioned in the last echocardiogram that was reported in May-this can contribute to shortness of breath  Graded exercises as you can tolerate is a must  I will see you in 4 to 6 weeks  Call with significant concerns

## 2020-03-31 ENCOUNTER — Other Ambulatory Visit (INDEPENDENT_AMBULATORY_CARE_PROVIDER_SITE_OTHER): Payer: BC Managed Care – PPO

## 2020-03-31 DIAGNOSIS — R0602 Shortness of breath: Secondary | ICD-10-CM | POA: Diagnosis not present

## 2020-03-31 DIAGNOSIS — D649 Anemia, unspecified: Secondary | ICD-10-CM | POA: Diagnosis not present

## 2020-03-31 LAB — FECAL OCCULT BLOOD, IMMUNOCHEMICAL: Fecal Occult Bld: NEGATIVE

## 2020-04-03 ENCOUNTER — Other Ambulatory Visit: Payer: Self-pay

## 2020-04-03 DIAGNOSIS — D649 Anemia, unspecified: Secondary | ICD-10-CM

## 2020-04-03 NOTE — Progress Notes (Signed)
ema

## 2020-04-04 ENCOUNTER — Telehealth: Payer: Self-pay | Admitting: Hematology and Oncology

## 2020-04-04 NOTE — Telephone Encounter (Signed)
Received a new hem referral from [facility] for IDA. Ms. [name] has been cld and scheduled to see Dr. Alvy Bimler on 9/17 at 1040am. Pt aware to arrive 30 minutes early.

## 2020-04-11 DIAGNOSIS — E113513 Type 2 diabetes mellitus with proliferative diabetic retinopathy with macular edema, bilateral: Secondary | ICD-10-CM | POA: Diagnosis not present

## 2020-04-13 ENCOUNTER — Other Ambulatory Visit (HOSPITAL_COMMUNITY): Payer: BC Managed Care – PPO

## 2020-04-13 ENCOUNTER — Encounter (HOSPITAL_COMMUNITY): Payer: Self-pay | Admitting: Pulmonary Disease

## 2020-04-13 NOTE — Progress Notes (Signed)
Cardiology Office Note:    Date:  04/14/2020   ID:  [name], DOB 1970/08/29, MRN [id number]  PCP:  [name], PA-C  Cardiologist:  Sinclair Grooms, MD   Referring MD: Shanon Rosser, [place]   Chief Complaint  Patient presents with   Congestive Heart Failure   Loss of Consciousness   Advice Only    Orthostasis    History of Present Illness:    [name] is a 49 y.o. female with a hx of acute on chronic diastolic heart failure, essential hypertension, type 1 diabetes mellitus, migraine headaches, and hypotension on Imdur/heart failure therapy associated with syncope.  [name] is doing better.  She is not having orthostatic dizziness that she was before.  She still has multiple pillow orthopnea.  This is been a longstanding problem.  We essentially discontinued all of her prior medications related to heart failure and blood pressure.  After doing that she did develop lower extremity edema and we added back Demadex 20 mg on Monday, Wednesday, and Friday.  This has done a great job of controlling her lower extremity edema.  Her blood pressures have still been variable.  Past Medical History:  Diagnosis Date   Anemia    Anxiety    Arthritis    Asthma    CHF (congestive heart failure) ([place])    Depression    History of chicken pox    Migraines    Mood disorder (HCC)    anxiety   Prurigo nodularis    with diabetic dermopathy   Type 1 diabetes, uncontrolled, with neuropathy ([place])    [place]    Past Surgical History:  Procedure Laterality Date   treadmill stress test  01/2013   WNL, low risk study    Current Medications: Current Meds  Medication Sig   albuterol (PROAIR HFA) 108 (90 Base) MCG/ACT inhaler Inhale 2 puffs into the lungs every 4 (four) hours as needed for wheezing or shortness of breath.    Continuous Blood Gluc Sensor (FREESTYLE LIBRE 2 SENSOR) MISC CHANGE EVERY 14 DAYS   hydrOXYzine (ATARAX/VISTARIL) 25 MG  tablet Take 25 mg by mouth 3 (three) times daily as needed.   Insulin Disposable Pump (OMNIPOD 10 PACK) MISC by Does not apply route.   LORazepam (ATIVAN) 0.5 MG tablet Take 0.5 mg by mouth every 8 (eight) hours as needed for anxiety.   tobramycin (TOBREX) 0.3 % ophthalmic solution SMARTSIG:In Eye(s)   torsemide (DEMADEX) 20 MG tablet Take 1 tablet (20 mg total) by mouth every Monday, Wednesday, and Friday.     Allergies:   Amoxicillin and Lantus [insulin glargine]   Social History   Socioeconomic History   Marital status: Married    Spouse name: Not on file   Number of children: 2   Years of education: Not on file   Highest education level: Not on file  Occupational History   Not on file  Tobacco Use   Smoking status: Never Smoker   Smokeless tobacco: Never Used  Vaping Use   Vaping Use: Never used  Substance and Sexual Activity   Alcohol use: Not Currently   Drug use: No   Sexual activity: Yes    Partners: Male    Birth control/protection: None  Other Topics Concern   Not on file  Social History Narrative   Caffeine use: daily   Occupation: cleans houses   Regular exercise: yes, active 5x/wk   Diet: good water, fruits/vegetables daily   Social Determinants of Health  Financial Resource Strain:    Difficulty of Paying Living Expenses: Not on file  Food Insecurity:    Worried About Charity fundraiser in the Last Year: Not on file   [employer] of Food in the Last Year: Not on file  Transportation Needs:    Lack of Transportation (Medical): Not on file   Lack of Transportation (Non-Medical): Not on file  Physical Activity:    Days of Exercise per Week: Not on file   Minutes of Exercise per Session: Not on file  Stress:    Feeling of Stress : Not on file  Social Connections:    Frequency of Communication with Friends and Family: Not on file   Frequency of Social Gatherings with Friends and Family: Not on file   Attends Religious Services:  Not on file   Active Member of Clubs or Organizations: Not on file   Attends Archivist Meetings: Not on file   Marital Status: Not on file     Family History: The patient's family history includes Bipolar disorder in her mother; CAD in her maternal aunt; Calcium disorder in her mother; Cancer in her maternal grandmother, maternal uncle, and paternal grandmother; Cancer (age of onset: 44) in her maternal aunt; Diabetes in her mother, paternal grandfather, paternal grandmother, and sister; Heart disease in her mother; Heart failure in her sister; Hypertension in her mother; Stroke in her maternal aunt; Thyroid disease in her mother.  ROS:   Please see the history of present illness.    Unable to wear support stockings.  The ones that she purchased are too small for her legs.  Without significant edema and blood pressures are higher, she is asymptomatic relative to orthostatic hypotension.  No fainting, lightheadedness, or dizziness.  All other systems reviewed and are negative.  EKGs/Labs/Other Studies Reviewed:    The following studies were reviewed today: No new imaging data  EKG:  EKG not repeated  Recent Labs: 01/31/2020: Magnesium 2.1; NT-Pro BNP 1,696 03/27/2020: ALT 10; BUN 26; Creatinine, Ser 0.66; Hemoglobin 9.0; Platelets 240.0; Potassium 4.3; Sodium 141; TSH 2.00  Recent Lipid Panel    Component Value Date/Time   CHOL 205 (H) 01/31/2020 1008   TRIG 70 01/31/2020 1008   HDL 73 01/31/2020 1008   CHOLHDL 2.9 12/03/2019 0952   CHOLHDL 5.2 10/12/2019 0834   VLDL 16 10/12/2019 0834   LDLCALC 119 (H) 01/31/2020 1008   LDLDIRECT 104.1 04/12/2014 1608    Physical Exam:    VS:  BP 140/90    Pulse 96    Ht 6' (1.829 m)    Wt 170 lb 6.4 oz (77.3 kg)    SpO2 97%    BMI 23.11 kg/m     Wt Readings from Last 3 Encounters:  04/14/20 170 lb 6.4 oz (77.3 kg)  03/29/20 171 lb 3.2 oz (77.7 kg)  03/27/20 171 lb (77.6 kg)     GEN: Slender.  Weight is up 17 pounds compared  to her last visit with me when she weighed 153 pounds and was severely orthostatic.Marland Kitchen No acute distress HEENT: Normal NECK: No JVD. LYMPHATICS: No lymphadenopathy CARDIAC:  RRR without murmur, gallop, or edema. VASCULAR: Normal Pulses. No bruits. RESPIRATORY:  Clear to auscultation without rales, wheezing or rhonchi  ABDOMEN: Soft, non-tender, non-distended, No pulsatile mass, MUSCULOSKELETAL: No deformity  SKIN: Warm and dry NEUROLOGIC:  Alert and oriented x 3 PSYCHIATRIC:  Normal affect   ASSESSMENT:    1. Orthostatic hypotension   2. Anemia  of chronic disease   3. Dyspnea on exertion   4. Type 1 diabetes mellitus with complications (HCC)   5. Mixed hyperlipidemia   6. Diastolic dysfunction   7. Educated about COVID-19 virus infection    PLAN:    In order of problems listed above:  1. Orthostasis has resolved.  Today she has hypertension or diabetic relatively rapid heart rate.  Plan is to add carvedilol 6.25 mg twice daily and continue to monitor the blood pressure.  We will try to control to 80 blood pressure in the 130-140/80 mmHg range.  Suspect her initial presentation here was a mix of volume contraction and potential autonomic neuropathy.  At this juncture, it seems more volume depletion than autonomic neuropathy. 2. This is being evaluated by hematology. 3. This has improved after the addition of low-dose torsemide 3 times per week 4. Now on an insulin pump and doing better 5. Lipids will need to be managed with a statin.  Last LDL was 119 in June.   Medication Adjustments/Labs and Tests Ordered: Current medicines are reviewed at length with the patient today.  Concerns regarding medicines are outlined above.  No orders of the defined types were placed in this encounter.  Meds ordered this encounter  Medications   carvedilol (COREG) 6.25 MG tablet    Sig: Take 1 tablet (6.25 mg total) by mouth 2 (two) times daily.    Dispense:  180 tablet    Refill:  3    Place  on hold and patient will call when needed    There are no Patient Instructions on file for this visit.   Signed, Sinclair Grooms, MD  04/14/2020 5:10 PM     [facility]

## 2020-04-14 ENCOUNTER — Encounter: Payer: Self-pay | Admitting: Interventional Cardiology

## 2020-04-14 ENCOUNTER — Other Ambulatory Visit: Payer: Self-pay

## 2020-04-14 ENCOUNTER — Ambulatory Visit: Payer: BC Managed Care – PPO | Admitting: Interventional Cardiology

## 2020-04-14 VITALS — BP 140/90 | HR 96 | Ht 72.0 in | Wt 170.4 lb

## 2020-04-14 DIAGNOSIS — Z7189 Other specified counseling: Secondary | ICD-10-CM

## 2020-04-14 DIAGNOSIS — D638 Anemia in other chronic diseases classified elsewhere: Secondary | ICD-10-CM | POA: Diagnosis not present

## 2020-04-14 DIAGNOSIS — R0600 Dyspnea, unspecified: Secondary | ICD-10-CM

## 2020-04-14 DIAGNOSIS — E108 Type 1 diabetes mellitus with unspecified complications: Secondary | ICD-10-CM | POA: Diagnosis not present

## 2020-04-14 DIAGNOSIS — E782 Mixed hyperlipidemia: Secondary | ICD-10-CM

## 2020-04-14 DIAGNOSIS — I951 Orthostatic hypotension: Secondary | ICD-10-CM | POA: Diagnosis not present

## 2020-04-14 DIAGNOSIS — I5189 Other ill-defined heart diseases: Secondary | ICD-10-CM

## 2020-04-14 DIAGNOSIS — R0609 Other forms of dyspnea: Secondary | ICD-10-CM

## 2020-04-14 MED ORDER — CARVEDILOL 6.25 MG PO TABS: 6.2500 mg | ORAL_TABLET | Freq: Two times a day (BID) | ORAL | 3 refills | Status: DC

## 2020-04-14 NOTE — Patient Instructions (Signed)
Medication Instructions:  1) START Carvedilol 6.25mg  twice daily  *If you need a refill on your cardiac medications before your next appointment, please call your pharmacy*   Lab Work: None If you have labs (blood work) drawn today and your tests are completely normal, you will receive your results only by: Marland Kitchen MyChart Message (if you have MyChart) OR . A paper copy in the mail If you have any lab test that is abnormal or we need to change your treatment, we will call you to review the results.   Testing/Procedures: None   Follow-Up: At [facility], you and your health needs are our priority.  As part of our continuing mission to provide you with exceptional heart care, we have created designated Provider Care Teams.  These Care Teams include your primary Cardiologist (physician) and Advanced Practice Providers (APPs -  Physician Assistants and Nurse Practitioners) who all work together to provide you with the care you need, when you need it.  We recommend signing up for the patient portal called "MyChart".  Sign up information is provided on this After Visit Summary.  MyChart is used to connect with patients for Virtual Visits (Telemedicine).  Patients are able to view lab/test results, encounter notes, upcoming appointments, etc.  Non-urgent messages can be sent to your provider as well.   To learn more about what you can do with MyChart, go to [email or web address].    Your next appointment:   2 month(s)  The format for your next appointment:   In Person  Provider:   You may see Sinclair Grooms, MD or one of the following Advanced Practice Providers on your designated Care Team:    Truitt Merle, NP  Cecilie Kicks, NP  Kathyrn Drown, NP    Other Instructions

## 2020-04-17 ENCOUNTER — Other Ambulatory Visit: Payer: Self-pay

## 2020-04-17 ENCOUNTER — Encounter: Payer: Self-pay | Admitting: Physician Assistant

## 2020-04-17 ENCOUNTER — Ambulatory Visit: Payer: BC Managed Care – PPO | Admitting: Physician Assistant

## 2020-04-17 VITALS — BP 120/80 | HR 98 | Temp 98.3°F | Resp 14 | Ht 72.0 in | Wt 176.0 lb

## 2020-04-17 DIAGNOSIS — L659 Nonscarring hair loss, unspecified: Secondary | ICD-10-CM

## 2020-04-17 DIAGNOSIS — I5032 Chronic diastolic (congestive) heart failure: Secondary | ICD-10-CM | POA: Diagnosis not present

## 2020-04-17 DIAGNOSIS — E1065 Type 1 diabetes mellitus with hyperglycemia: Secondary | ICD-10-CM

## 2020-04-17 DIAGNOSIS — D539 Nutritional anemia, unspecified: Secondary | ICD-10-CM | POA: Diagnosis not present

## 2020-04-17 MED ORDER — BETAMETHASONE VALERATE 0.12 % EX FOAM: CUTANEOUS | 0 refills | Status: DC

## 2020-04-17 MED ORDER — LORAZEPAM 0.5 MG PO TABS: 0.5000 mg | ORAL_TABLET | Freq: Three times a day (TID) | ORAL | 0 refills | Status: DC | PRN

## 2020-04-17 MED ORDER — HYDROXYZINE HCL 25 MG PO TABS: 25.0000 mg | ORAL_TABLET | Freq: Three times a day (TID) | ORAL | 2 refills | Status: DC | PRN

## 2020-04-17 NOTE — Patient Instructions (Signed)
I have sent in medication refills for you. I have also sent in a script for the clobetasol foam to apply twice daily as directed. Follow-up in 3-4 weeks regarding hair loss.

## 2020-04-17 NOTE — Progress Notes (Signed)
° °Patient presents to clinic today for follow-up tpaof current medical state and to discuss medication refills.  ° °At last visit (New Patient appointment) patient was struggling with SOBOE, fatigue and highly labile blood glucose levels. Workup at last visit included blood work (CBC revealing moderate anemia with hgb at 9.0, normal iron panel and negative FIT. Normal AI labs). Patient states since last visit she was able to continue insulin pump which has helped tremendously in stabilizing her glucose levels. Has had follow-up with Cardiology and BP more stable. States this has helped resolution of her symptoms.Does have appt with Hematology for anemia and remote appt with GI for ongoing bowel issues. Is requesting return to work.  ° °Patient in need of a refill of her PRN Lorazepam for acute anxiety as she is out of medication.  ° °Patient noting still having issue with alopecia. Denies itching of scalp or scalp lesions. Mostly diffuse alopecia but more concentrated in temporal region. Has appt scheduled with Dermatology but wants to see if she can restart Clobetasol to the area as this has helped in the past.  ° ° ° °Past Medical History:  °Diagnosis Date  °• Anemia   °• Anxiety   °• Arthritis   °• Asthma   °• CHF (congestive heart failure) (HCC)   °• Depression   °• History of chicken pox   °• Migraines   °• Mood disorder (HCC)   ° anxiety  °• Prurigo nodularis   ° with diabetic dermopathy  °• Type 1 diabetes, uncontrolled, with neuropathy (HCC)   ° [place]  ° ° °Current Outpatient Medications on File Prior to Visit  °Medication Sig Dispense Refill  °• albuterol (PROAIR HFA) 108 (90 Base) MCG/ACT inhaler Inhale 2 puffs into the lungs every 4 (four) hours as needed for wheezing or shortness of breath.     °• carvedilol (COREG) 6.25 MG tablet Take 1 tablet (6.25 mg total) by mouth 2 (two) times daily. 180 tablet 3  °• Continuous Blood Gluc Sensor (FREESTYLE LIBRE 2 SENSOR) MISC CHANGE EVERY 14 DAYS    °•  hydrOXYzine (ATARAX/VISTARIL) 25 MG tablet Take 25 mg by mouth 3 (three) times daily as needed.    °• Insulin Disposable Pump (OMNIPOD 10 PACK) MISC by Does not apply route.    °• LORazepam (ATIVAN) 0.5 MG tablet Take 0.5 mg by mouth every 8 (eight) hours as needed for anxiety.    °• NOVOLOG 100 UNIT/ML injection SMARTSIG:0-120 Unit(s) SUB-Q Daily    °• tobramycin (TOBREX) 0.3 % ophthalmic solution SMARTSIG:In Eye(s)    °• torsemide (DEMADEX) 20 MG tablet Take 1 tablet (20 mg total) by mouth every Monday, Wednesday, and Friday. 45 tablet 3  ° °No current facility-administered medications on file prior to visit.  ° ° °Allergies  °Allergen Reactions  °• Amoxicillin Itching  °• Lantus [Insulin Glargine] Itching and Rash  ° ° °Family History  °Problem Relation Age of Onset  °• Diabetes Mother   °     type 2  °• Hypertension Mother   °• Thyroid disease Mother   °• Bipolar disorder Mother   °• Heart disease Mother   °• Calcium disorder Mother   °• Cancer Maternal Grandmother   °     Breast, stomach  °• Cancer Paternal Grandmother   °     stomach, lung (smoker)  °• Diabetes Paternal Grandmother   °• Diabetes Paternal Grandfather   °• Heart failure Sister   °• Diabetes Sister   °• Stroke   Maternal Aunt   °• Cancer Maternal Uncle   °     prostate  °• CAD Maternal Aunt   °     stents  °• Cancer Maternal Aunt 64  °     ovarian  ° ° °Social History  ° °Socioeconomic History  °• Marital status: Married  °  Spouse name: Not on file  °• Number of children: 2  °• Years of education: Not on file  °• Highest education level: Not on file  °Occupational History  °• Not on file  °Tobacco Use  °• Smoking status: Never Smoker  °• Smokeless tobacco: Never Used  °Vaping Use  °• Vaping Use: Never used  °Substance and Sexual Activity  °• Alcohol use: Not Currently  °• Drug use: No  °• Sexual activity: Yes  °  Partners: Male  °  Birth control/protection: None  °Other Topics Concern  °• Not on file  °Social History Narrative  ° Caffeine use:  daily  ° Occupation: cleans houses  ° Regular exercise: yes, active 5x/wk  ° Diet: good water, fruits/vegetables daily  ° °Social Determinants of Health  ° °Financial Resource Strain:   °• Difficulty of Paying Living Expenses: Not on file  °Food Insecurity:   °• Worried About Running Out of Food in the Last Year: Not on file  °• Ran Out of Food in the Last Year: Not on file  °Transportation Needs:   °• Lack of Transportation (Medical): Not on file  °• Lack of Transportation (Non-Medical): Not on file  °Physical Activity:   °• Days of Exercise per Week: Not on file  °• Minutes of Exercise per Session: Not on file  °Stress:   °• Feeling of Stress : Not on file  °Social Connections:   °• Frequency of Communication with Friends and Family: Not on file  °• Frequency of Social Gatherings with Friends and Family: Not on file  °• Attends Religious Services: Not on file  °• Active Member of Clubs or Organizations: Not on file  °• Attends Club or Organization Meetings: Not on file  °• Marital Status: Not on file  ° ° °Review of Systems - See HPI.  All other ROS are negative. ° °BP 120/80    Pulse 98    Temp 98.3 °F (36.8 °C) (Temporal)    Resp 14    Ht 6' (1.829 m)    Wt 176 lb (79.8 kg)    SpO2 99%    BMI 23.87 kg/m²  ° °Physical Exam °Vitals reviewed.  °Constitutional:   °   Appearance: Normal appearance.  °HENT:  °   Head: Normocephalic and atraumatic.  °   Right Ear: Tympanic membrane normal.  °   Left Ear: Tympanic membrane normal.  °Eyes:  °   Conjunctiva/sclera: Conjunctivae normal.  °   Pupils: Pupils are equal, round, and reactive to light.  °Cardiovascular:  °   Rate and Rhythm: Normal rate and regular rhythm.  °   Pulses: Normal pulses.  °   Heart sounds: Normal heart sounds.  °Pulmonary:  °   Effort: Pulmonary effort is normal.  °   Breath sounds: Normal breath sounds.  °Musculoskeletal:  °   Cervical back: Neck supple.  °Skin: ° °    °Neurological:  °   General: No focal deficit present.  °   Mental Status: She  is alert and oriented to person, place, and time.  °Psychiatric:     °   Mood and Affect:   Affect: Mood normal.     Recent Results (from the past 2160 hour(s))  Basic metabolic panel     Status: Abnormal   Collection Time: 01/31/20 10:08 AM  Result Value Ref Range   Glucose 244 (H) 65 - 99 mg/dL   BUN 22 6 - 24 mg/dL   Creatinine, Ser 0.79 0.57 - 1.00 mg/dL   GFR calc non Af Amer 88 >59 mL/min/1.73   GFR calc Af Amer 102 >59 mL/min/1.73    Comment: **[facility] currently reports eGFR in compliance with the current**   recommendations of the [employer]. [facility] will   update reporting as new guidelines are published from the NKF-ASN   Task force.    BUN/Creatinine Ratio 28 (H) 9 - 23   Sodium 138 134 - 144 mmol/L   Potassium 4.7 3.5 - 5.2 mmol/L   Chloride 107 (H) 96 - 106 mmol/L   CO2 21 20 - 29 mmol/L   Calcium 8.5 (L) 8.7 - 10.2 mg/dL  Lipid Panel With LDL/HDL Ratio     Status: Abnormal   Collection Time: 01/31/20 10:08 AM  Result Value Ref Range   Cholesterol, Total 205 (H) 100 - 199 mg/dL   Triglycerides 70 0 - 149 mg/dL   HDL 73 >39 mg/dL   VLDL Cholesterol Cal 13 5 - 40 mg/dL   LDL Chol Calc (NIH) 119 (H) 0 - 99 mg/dL   LDL/HDL Ratio 1.6 0.0 - 3.2 ratio    Comment:                                     LDL/HDL Ratio                                             Men  Women                               1/2 Avg.Risk  1.0    1.5                                   Avg.Risk  3.6    3.2                                2X Avg.Risk  6.2    5.0                                3X Avg.Risk  8.0    6.1   Pro b natriuretic peptide (BNP)[facility])     Status: Abnormal   Collection Time: 01/31/20 10:08 AM  Result Value Ref Range   NT-Pro BNP 1,696 (H) 0 - 249 pg/mL    Comment: The following cut-points have been suggested for the use of proBNP for the diagnostic evaluation of heart failure (HF) in patients with acute dyspnea: Modality                     Age            Optimal Cut                            (  years)            Point ------------------------------------------------------ Diagnosis (rule in HF)        <50            450 pg/mL                           50 - 75            900 pg/mL                               >75           1800 pg/mL Exclusion (rule out HF)  Age independent     300 pg/mL   Magnesium     Status: None   Collection Time: 01/31/20 10:08 AM  Result Value Ref Range   Magnesium 2.1 1.6 - 2.3 mg/dL  Antinuclear Antib (ANA)     Status: None   Collection Time: 03/27/20 11:36 AM  Result Value Ref Range   Anti Nuclear Antibody (ANA) NEGATIVE NEGATIVE    Comment: ANA IFA is a first line screen for detecting the presence of up to approximately 150 autoantibodies in various autoimmune diseases. A negative ANA IFA result suggests an ANA-associated autoimmune disease is not present at this time, but is not definitive. If there is high clinical suspicion for Sjogren's syndrome, testing for anti-SS-A/Ro antibody should be considered. Anti-Jo-1 antibody should be considered for clinically suspected inflammatory myopathies. . AC-0: Negative . International Consensus on ANA Patterns (https://www.hernandez-brewer.com/) . For additional information, please refer to http://education.[facility].com/faq/FAQ177 (This link is being provided for informational/ educational purposes only.) .   CRP High sensitivity     Status: None   Collection Time: 03/27/20 11:36 AM  Result Value Ref Range   CRP, High Sensitivity 1.020 0.000 - 5.000 mg/L    Comment: Note:  An elevated hs-CRP (>5 mg/L) should be repeated after 2 weeks to rule out recent infection or trauma.  TSH     Status: None   Collection Time: 03/27/20 11:36 AM  Result Value Ref Range   TSH 2.00 0.35 - 4.50 uIU/mL  Comp Met (CMET)     Status: Abnormal   Collection Time: 03/27/20 11:36 AM  Result Value Ref Range   Sodium 141 135 - 145 mEq/L   Potassium 4.3 3.5  - 5.1 mEq/L   Chloride 110 96 - 112 mEq/L   CO2 26 19 - 32 mEq/L   Glucose, Bld 97 70 - 99 mg/dL   BUN 26 (H) 6 - 23 mg/dL   Creatinine, Ser 0.66 0.40 - 1.20 mg/dL   Total Bilirubin 0.7 0.2 - 1.2 mg/dL   Alkaline Phosphatase 57 39 - 117 U/L   AST 12 0 - 37 U/L   ALT 10 0 - 35 U/L   Total Protein 5.6 (L) 6.0 - 8.3 g/dL   Albumin 2.9 (L) 3.5 - 5.2 g/dL   GFR 114.86 >60.00 mL/min   Calcium 8.6 8.4 - 10.5 mg/dL  CBC with Differential/Platelet     Status: Abnormal   Collection Time: 03/27/20 11:36 AM  Result Value Ref Range   WBC 4.6 4.0 - 10.5 K/uL   RBC 3.19 (L) 3.87 - 5.11 Mil/uL   Hemoglobin 9.0 (L) 12.0 - 15.0 g/dL   HCT 27.1 (L) 36 - 46 %   MCV 85.1 78.0 - 100.0 fl   MCHC 33.1 30.0 -  36.0 g/dL  ° RDW 15.2 11.5 - 15.5 %  ° Platelets 240.0 150 - 400 K/uL  ° Neutrophils Relative % 58.4 43 - 77 %  ° Lymphocytes Relative 35.5 12 - 46 %  ° Monocytes Relative 4.5 3 - 12 %  ° Eosinophils Relative 1.2 0 - 5 %  ° Basophils Relative 0.4 0 - 3 %  ° Neutro Abs 2.7 1.4 - 7.7 K/uL  ° Lymphs Abs 1.6 0.7 - 4.0 K/uL  ° Monocytes Absolute 0.2 0 - 1 K/uL  ° Eosinophils Absolute 0.1 0 - 0 K/uL  ° Basophils Absolute 0.0 0 - 0 K/uL  °Iron, TIBC and Ferritin Panel     Status: None  ° Collection Time: 03/27/20 11:36 AM  °Result Value Ref Range  ° Iron 84 40 - 190 mcg/dL  ° TIBC 292 250 - 450 mcg/dL (calc)  ° %SAT 29 16 - 45 % (calc)  ° Ferritin 16 16 - 232 ng/mL  °Fecal occult blood, imunochemical([facility])     Status: None  ° Collection Time: 03/31/20  3:30 PM  ° Specimen: Stool  °Result Value Ref Range  ° Fecal Occult Bld Negative Negative  ° ° °Assessment/Plan: °1. Deficiency anemia °Now followed with Hematology. Feels much better than at last visit. Appointment scheduled for 04/21/2020. ER precautions reviewed with patient.  ° °2. Uncontrolled type 1 diabetes mellitus with hyperglycemia, with long-term current use of insulin (HCC) °Managed by Endocrinology. Has insulin pump now with significant  stabilization in glucose levels which is helping greatly with fatigue.  ° °3. Chronic heart failure with preserved ejection fraction (HCC) °Euvolemic on exam today. Lungs CTAB. BP normal. Continue management per Cardiology.  ° °4. Alopecia °Will start Clobetasol focally to more significant areas to hopefully help improve her condition while awaiting her dermatology appt. May need consideration of intra-scalp corticosteroid injections versus other treatments.  °- Betamethasone Valerate 0.12 % foam; Apply thin layer topically twice daily to areas of hair loss.  Dispense: 100 g; Refill: 0 ° °This visit occurred during the SARS-CoV-2 public health emergency.  Safety protocols were in place, including screening questions prior to the visit, additional usage of staff PPE, and extensive cleaning of exam room while observing appropriate contact time as indicated for disinfecting solutions.  ° ° ° ° Cody , PA-C °

## 2020-04-18 DIAGNOSIS — E1165 Type 2 diabetes mellitus with hyperglycemia: Secondary | ICD-10-CM | POA: Diagnosis not present

## 2020-04-18 DIAGNOSIS — R809 Proteinuria, unspecified: Secondary | ICD-10-CM | POA: Diagnosis not present

## 2020-04-20 ENCOUNTER — Other Ambulatory Visit: Payer: Self-pay | Admitting: Internal Medicine

## 2020-04-20 DIAGNOSIS — E049 Nontoxic goiter, unspecified: Secondary | ICD-10-CM | POA: Diagnosis not present

## 2020-04-20 DIAGNOSIS — E041 Nontoxic single thyroid nodule: Secondary | ICD-10-CM

## 2020-04-20 DIAGNOSIS — Z794 Long term (current) use of insulin: Secondary | ICD-10-CM | POA: Diagnosis not present

## 2020-04-20 DIAGNOSIS — E1140 Type 2 diabetes mellitus with diabetic neuropathy, unspecified: Secondary | ICD-10-CM | POA: Diagnosis not present

## 2020-04-20 DIAGNOSIS — E1165 Type 2 diabetes mellitus with hyperglycemia: Secondary | ICD-10-CM | POA: Diagnosis not present

## 2020-04-21 ENCOUNTER — Encounter: Payer: Self-pay | Admitting: Physician Assistant

## 2020-04-21 ENCOUNTER — Encounter: Payer: Self-pay | Admitting: Hematology and Oncology

## 2020-04-21 ENCOUNTER — Inpatient Hospital Stay: Payer: BC Managed Care – PPO | Attending: Hematology and Oncology | Admitting: Hematology and Oncology

## 2020-04-21 ENCOUNTER — Telehealth: Payer: Self-pay | Admitting: Physician Assistant

## 2020-04-21 ENCOUNTER — Telehealth: Payer: Self-pay | Admitting: Hematology and Oncology

## 2020-04-21 ENCOUNTER — Other Ambulatory Visit: Payer: Self-pay

## 2020-04-21 DIAGNOSIS — Z8042 Family history of malignant neoplasm of prostate: Secondary | ICD-10-CM | POA: Insufficient documentation

## 2020-04-21 DIAGNOSIS — Z794 Long term (current) use of insulin: Secondary | ICD-10-CM | POA: Insufficient documentation

## 2020-04-21 DIAGNOSIS — D539 Nutritional anemia, unspecified: Secondary | ICD-10-CM

## 2020-04-21 DIAGNOSIS — Z800 Family history of malignant neoplasm of digestive organs: Secondary | ICD-10-CM | POA: Diagnosis not present

## 2020-04-21 DIAGNOSIS — J45909 Unspecified asthma, uncomplicated: Secondary | ICD-10-CM | POA: Diagnosis not present

## 2020-04-21 DIAGNOSIS — Z803 Family history of malignant neoplasm of breast: Secondary | ICD-10-CM

## 2020-04-21 DIAGNOSIS — Z833 Family history of diabetes mellitus: Secondary | ICD-10-CM | POA: Diagnosis not present

## 2020-04-21 DIAGNOSIS — L97929 Non-pressure chronic ulcer of unspecified part of left lower leg with unspecified severity: Secondary | ICD-10-CM

## 2020-04-21 DIAGNOSIS — F409 Phobic anxiety disorder, unspecified: Secondary | ICD-10-CM | POA: Diagnosis not present

## 2020-04-21 DIAGNOSIS — Z299 Encounter for prophylactic measures, unspecified: Secondary | ICD-10-CM

## 2020-04-21 DIAGNOSIS — D649 Anemia, unspecified: Secondary | ICD-10-CM | POA: Insufficient documentation

## 2020-04-21 DIAGNOSIS — Z8249 Family history of ischemic heart disease and other diseases of the circulatory system: Secondary | ICD-10-CM | POA: Diagnosis not present

## 2020-04-21 DIAGNOSIS — Z79899 Other long term (current) drug therapy: Secondary | ICD-10-CM | POA: Diagnosis not present

## 2020-04-21 DIAGNOSIS — I509 Heart failure, unspecified: Secondary | ICD-10-CM | POA: Diagnosis not present

## 2020-04-21 DIAGNOSIS — L97919 Non-pressure chronic ulcer of unspecified part of right lower leg with unspecified severity: Secondary | ICD-10-CM

## 2020-04-21 DIAGNOSIS — K5900 Constipation, unspecified: Secondary | ICD-10-CM

## 2020-04-21 DIAGNOSIS — F419 Anxiety disorder, unspecified: Secondary | ICD-10-CM | POA: Insufficient documentation

## 2020-04-21 DIAGNOSIS — Z801 Family history of malignant neoplasm of trachea, bronchus and lung: Secondary | ICD-10-CM | POA: Diagnosis not present

## 2020-04-21 DIAGNOSIS — E119 Type 2 diabetes mellitus without complications: Secondary | ICD-10-CM | POA: Insufficient documentation

## 2020-04-21 DIAGNOSIS — Z8349 Family history of other endocrine, nutritional and metabolic diseases: Secondary | ICD-10-CM | POA: Insufficient documentation

## 2020-04-21 DIAGNOSIS — Z23 Encounter for immunization: Secondary | ICD-10-CM | POA: Diagnosis not present

## 2020-04-21 MED ORDER — INFLUENZA VAC SPLIT QUAD 0.5 ML IM SUSY
PREFILLED_SYRINGE | INTRAMUSCULAR | Status: AC
  Filled 2020-04-21: qty 0.5

## 2020-04-21 MED ORDER — INFLUENZA VAC SPLIT QUAD 0.5 ML IM SUSY
0.5000 mL | PREFILLED_SYRINGE | Freq: Once | INTRAMUSCULAR | Status: AC
  Administered 2020-04-21: 0.5 mL via INTRAMUSCULAR

## 2020-04-21 NOTE — Assessment & Plan Note (Signed)
The most likely cause of her anemia is due to chronic blood loss/malabsorption syndrome. We discussed some of the risks, benefits, and alternatives of intravenous iron infusions. The patient is symptomatic from anemia and the iron level is critically low. She tolerated oral iron supplement poorly and desires to achieved higher levels of iron faster for adequate hematopoesis. Some of the side-effects to be expected including risks of infusion reactions, phlebitis, headaches, nausea and fatigue.  The patient is willing to proceed. Patient education material was dispensed.  Goal is to keep ferritin level greater than 50 and resolution of anemia I will schedule weekly IV iron x2 I plan to see her back in November with repeat blood work to be done a week ahead of time I recommend multivitamin supplement for effective erythropoiesis I suspect there is a component of iron deficiency and anemia chronic disease secondary to her diabetes and foot ulcers

## 2020-04-21 NOTE — Assessment & Plan Note (Signed)
We discussed the importance of preventive care and reviewed the vaccination programs. She does not have any prior allergic reactions to influenza vaccination. She agrees to proceed with influenza vaccination today and we will administer it today at the clinic.  

## 2020-04-21 NOTE — Telephone Encounter (Signed)
Scheduled appts per 9/17 sch msg. Gave pt a print out of AVS.  

## 2020-04-21 NOTE — Telephone Encounter (Signed)
Patient would like a doctor's note releasing her to return to work on Monday, September 20th.  Please advise.

## 2020-04-21 NOTE — Progress Notes (Signed)
[place] NOTE  Patient Care Team: Delorse Limber as PCP - General (Family Medicine) [name], MD as PCP - Cardiology (Cardiology) Ob/Gyn, Howell Rucks, Dellis Filbert, MD as Consulting Physician (Endocrinology) [name], MD as Consulting Physician (Cardiology) Sherlynn Stalls, MD as Consulting Physician (Ophthalmology) Early, Arvilla Meres, MD as Consulting Physician (Vascular Surgery)  CHIEF COMPLAINTS/PURPOSE OF CONSULTATION:  Multifactorial anemia  HISTORY OF PRESENTING ILLNESS:  [name] 49 y.o. female is here because of recent findings of anemia The patient has insulin-dependent diabetes for many years She was recently found to have chronic anemia  Her baseline hemoglobin from 10 years ago were within normal limits Starting late last year, she has progressive anemia Her recent blood work showed hemoglobin of 9 with iron deficiency The patient have history of poorly controlled diabetes She had significant leg ulcers 5 years ago that was successfully treated at the wound care center Approximately a year ago, she started to have significant ulcer on both legs with open discharge at times and bleeding  She denies recent chest pain on exertion, shortness of breath on minimal exertion, pre-syncopal episodes, or palpitations. She had not noticed any recent bleeding such as epistaxis, hematuria or hematochezia The patient denies over the counter NSAID ingestion. She is not on antiplatelets agents.  She had no prior history or diagnosis of cancer. Her age appropriate screening programs are up-to-date. She denies has pica with excessive chewing of ice and eats a variety of diet. She never donated blood or received blood transfusion The patient was prescribed oral iron supplements and she takes them in the past complicated by severe nausea and constipation  MEDICAL HISTORY:  Past Medical History:  Diagnosis Date  . Anemia   .  Anxiety   . Arthritis   . Asthma   . CHF (congestive heart failure) ([place])   . Depression   . History of chicken pox   . Migraines   . Mood disorder (HCC)    anxiety  . Prurigo nodularis    with diabetic dermopathy  . Type 1 diabetes, uncontrolled, with neuropathy ([place])    [place]    SURGICAL HISTORY: Past Surgical History:  Procedure Laterality Date  . treadmill stress test  01/2013   WNL, low risk study    SOCIAL HISTORY: Social History   Socioeconomic History  . Marital status: Legally Separated    Spouse name: Not on file  . Number of children: 2  . Years of education: Not on file  . Highest education level: Not on file  Occupational History  . Occupation: Freight forwarder  Tobacco Use  . Smoking status: Never Smoker  . Smokeless tobacco: Never Used  Vaping Use  . Vaping Use: Never used  Substance and Sexual Activity  . Alcohol use: Not Currently  . Drug use: No  . Sexual activity: Yes    Partners: Male    Birth control/protection: None  Other Topics Concern  . Not on file  Social History Narrative   Caffeine use: daily   Occupation: cleans houses   Regular exercise: yes, active 5x/wk   Diet: good water, fruits/vegetables daily   Social Determinants of Health   Financial Resource Strain:   . Difficulty of Paying Living Expenses: Not on file  Food Insecurity:   . Worried About Charity fundraiser in the Last Year: Not on file  . Ran Out of Food in the Last Year: Not on file  Transportation Needs:   .  Lack of Transportation (Medical): Not on file  . Lack of Transportation (Non-Medical): Not on file  Physical Activity:   . Days of Exercise per Week: Not on file  . Minutes of Exercise per Session: Not on file  Stress:   . Feeling of Stress : Not on file  Social Connections:   . Frequency of Communication with Friends and Family: Not on file  . Frequency of Social Gatherings with Friends and Family: Not on file  . Attends Religious Services: Not on file  .  Active Member of Clubs or Organizations: Not on file  . Attends Archivist Meetings: Not on file  . Marital Status: Not on file  Intimate Partner Violence:   . Fear of Current or Ex-Partner: Not on file  . Emotionally Abused: Not on file  . Physically Abused: Not on file  . Sexually Abused: Not on file    FAMILY HISTORY: Family History  Problem Relation Age of Onset  . Diabetes Mother        type 2  . Hypertension Mother   . Thyroid disease Mother   . Bipolar disorder Mother   . Heart disease Mother   . Calcium disorder Mother   . Cancer Maternal Grandmother        Breast, stomach  . Cancer Paternal Grandmother        stomach, lung (smoker)  . Diabetes Paternal Grandmother   . Diabetes Paternal Grandfather   . Heart failure Sister   . Diabetes Sister   . Stroke Maternal Aunt   . Cancer Maternal Uncle        prostate  . CAD Maternal Aunt        stents  . Cancer Maternal Aunt 75       ovarian    ALLERGIES:  is allergic to amoxicillin and lantus [insulin glargine].  MEDICATIONS:  Current Outpatient Medications  Medication Sig Dispense Refill  . atorvastatin (LIPITOR) 10 MG tablet Take 10 mg by mouth daily.    Marland Kitchen albuterol (PROAIR HFA) 108 (90 Base) MCG/ACT inhaler Inhale 2 puffs into the lungs every 4 (four) hours as needed for wheezing or shortness of breath.     . Betamethasone Valerate 0.12 % foam Apply thin layer topically twice daily to areas of hair loss. 100 g 0  . carvedilol (COREG) 6.25 MG tablet Take 1 tablet (6.25 mg total) by mouth 2 (two) times daily. 180 tablet 3  . Continuous Blood Gluc Sensor (FREESTYLE LIBRE 2 SENSOR) MISC CHANGE EVERY 14 DAYS    . hydrOXYzine (ATARAX/VISTARIL) 25 MG tablet Take 1 tablet (25 mg total) by mouth 3 (three) times daily as needed. 60 tablet 2  . Insulin Disposable Pump (OMNIPOD 10 PACK) MISC by Does not apply route.    Marland Kitchen LORazepam (ATIVAN) 0.5 MG tablet Take 1 tablet (0.5 mg total) by mouth every 8 (eight) hours as  needed for anxiety. 20 tablet 0  . NOVOLOG 100 UNIT/ML injection SMARTSIG:0-120 Unit(s) SUB-Q Daily    . tobramycin (TOBREX) 0.3 % ophthalmic solution SMARTSIG:In Eye(s)    . torsemide (DEMADEX) 20 MG tablet Take 1 tablet (20 mg total) by mouth every Monday, Wednesday, and Friday. 45 tablet 3   No current facility-administered medications for this visit.    REVIEW OF SYSTEMS: She has chronic peripheral neuropathy affecting her hands and feet Constitutional: Denies fevers, chills or abnormal night sweats Eyes: Denies blurriness of vision, double vision or watery eyes Ears, nose, mouth, throat, and face: Denies  mucositis or sore throat Respiratory: Denies cough, dyspnea or wheezes Cardiovascular: Denies palpitation, chest discomfort or lower extremity swelling Gastrointestinal:  Denies nausea, heartburn or change in bowel habits Lymphatics: Denies new lymphadenopathy or easy bruising Behavioral/Psych: Mood is stable, no new changes  All other systems were reviewed with the patient and are negative.  PHYSICAL EXAMINATION: ECOG PERFORMANCE STATUS: 1 - Symptomatic but completely ambulatory  Vitals:   04/21/20 1049  BP: 137/77  Pulse: 95  Resp: 18  Temp: (!) 97.5 F (36.4 C)  SpO2: 100%   Filed Weights   04/21/20 1049  Weight: 170 lb 9.6 oz (77.4 kg)    GENERAL:alert, no distress and comfortable SKIN: Noted significant skin ulcers on both legs EYES: normal, conjunctiva are pink and non-injected, sclera clear OROPHARYNX:no exudate, no erythema and lips, buccal mucosa, and tongue normal  NECK: supple, thyroid normal size, non-tender, without nodularity LYMPH:  no palpable lymphadenopathy in the cervical, axillary or inguinal LUNGS: clear to auscultation and percussion with normal breathing effort HEART: regular rate & rhythm and no murmurs and no lower extremity edema ABDOMEN:abdomen soft, non-tender and normal bowel sounds Musculoskeletal:no cyanosis of digits and no clubbing   PSYCH: alert & oriented x 3 with fluent speech NEURO: no focal motor/sensory deficits  Lab Results  Component Value Date   WBC 4.6 03/27/2020   HGB 9.0 (L) 03/27/2020   HCT 27.1 (L) 03/27/2020   MCV 85.1 03/27/2020   PLT 240.0 03/27/2020    RADIOGRAPHIC STUDIES: I have personally reviewed the radiological images as listed and agreed with the findings in the report. DG Chest 2 View  Result Date: 03/29/2020 CLINICAL DATA:  Dyspnea EXAM: CHEST - 2 VIEW COMPARISON:  11/25/2017 chest radiograph. FINDINGS: Stable cardiomediastinal silhouette with normal heart size. No pneumothorax. No pleural effusion. Cephalization of the pulmonary vasculature without overt pulmonary edema. No acute consolidative airspace disease. IMPRESSION: No active cardiopulmonary disease. Electronically Signed   By: Ilona Sorrel M.D.   On: 03/29/2020 14:38    ASSESSMENT & PLAN:  Deficiency anemia The most likely cause of her anemia is due to chronic blood loss/malabsorption syndrome. We discussed some of the risks, benefits, and alternatives of intravenous iron infusions. The patient is symptomatic from anemia and the iron level is critically low. She tolerated oral iron supplement poorly and desires to achieved higher levels of iron faster for adequate hematopoesis. Some of the side-effects to be expected including risks of infusion reactions, phlebitis, headaches, nausea and fatigue.  The patient is willing to proceed. Patient education material was dispensed.  Goal is to keep ferritin level greater than 50 and resolution of anemia I will schedule weekly IV iron x2 I plan to see her back in November with repeat blood work to be done a week ahead of time I recommend multivitamin supplement for effective erythropoiesis I suspect there is a component of iron deficiency and anemia chronic disease secondary to her diabetes and foot ulcers  Ulcers of both lower legs ([place]) This is likely related to her diabetes I  recommend wound care referral and she is in agreement  Preventive measure We discussed the importance of preventive care and reviewed the vaccination programs. She does not have any prior allergic reactions to influenza vaccination. She agrees to proceed with influenza vaccination today and we will administer it today at the clinic.   Orders Placed This Encounter  Procedures  . CBC with Differential/Platelet    Standing Status:   Standing    Number of Occurrences:  22    Standing Expiration Date:   04/21/2021  . Ferritin    Standing Status:   Future    Standing Expiration Date:   04/21/2021  . Iron and TIBC    Standing Status:   Future    Standing Expiration Date:   04/21/2021  . Sedimentation rate    Standing Status:   Future    Standing Expiration Date:   04/21/2021  . Vitamin B12    Standing Status:   Future    Standing Expiration Date:   04/21/2021  . Reticulocytes ([place])    Standing Status:   Future    Standing Expiration Date:   04/21/2021  . AMB referral to wound care center    Referral Priority:   Routine    Referral Type:   Consultation    Number of Visits Requested:   1     All questions were answered. The patient knows to call the clinic with any problems, questions or concerns.  The total time spent in the appointment was 60 minutes encounter with patients including review of chart and various tests results, discussions about plan of care and coordination of care plan  Heath Lark, MD 9/17/202112:35 PM       Heath Lark, MD 04/21/20 12:35 PM

## 2020-04-21 NOTE — Assessment & Plan Note (Signed)
This is likely related to her diabetes I recommend wound care referral and she is in agreement

## 2020-04-21 NOTE — Patient Instructions (Signed)

## 2020-04-21 NOTE — Telephone Encounter (Signed)
Notified patient that updated doctor note to return back to work on Monday 04/24/20 has been completed and added to her my chart.

## 2020-04-24 ENCOUNTER — Telehealth (HOSPITAL_COMMUNITY): Payer: Self-pay | Admitting: Pulmonary Disease

## 2020-04-24 NOTE — Telephone Encounter (Signed)
Just an FYI. We have made several attempts to contact this patient including sending a letter to schedule or reschedule their echocardiogram. We will be removing the patient from the echo [place].   04/13/20 MAILED LETTER [facility]  04/13/20 Pt No Showed    Thank you

## 2020-04-27 ENCOUNTER — Inpatient Hospital Stay: Payer: BC Managed Care – PPO

## 2020-04-27 ENCOUNTER — Other Ambulatory Visit: Payer: Self-pay

## 2020-04-27 VITALS — BP 151/98 | HR 99 | Temp 98.3°F | Resp 17 | Wt 176.2 lb

## 2020-04-27 DIAGNOSIS — Z833 Family history of diabetes mellitus: Secondary | ICD-10-CM | POA: Diagnosis not present

## 2020-04-27 DIAGNOSIS — Z8249 Family history of ischemic heart disease and other diseases of the circulatory system: Secondary | ICD-10-CM | POA: Diagnosis not present

## 2020-04-27 DIAGNOSIS — Z801 Family history of malignant neoplasm of trachea, bronchus and lung: Secondary | ICD-10-CM | POA: Diagnosis not present

## 2020-04-27 DIAGNOSIS — Z8349 Family history of other endocrine, nutritional and metabolic diseases: Secondary | ICD-10-CM | POA: Diagnosis not present

## 2020-04-27 DIAGNOSIS — Z23 Encounter for immunization: Secondary | ICD-10-CM | POA: Diagnosis not present

## 2020-04-27 DIAGNOSIS — J45909 Unspecified asthma, uncomplicated: Secondary | ICD-10-CM | POA: Diagnosis not present

## 2020-04-27 DIAGNOSIS — Z8042 Family history of malignant neoplasm of prostate: Secondary | ICD-10-CM | POA: Diagnosis not present

## 2020-04-27 DIAGNOSIS — Z803 Family history of malignant neoplasm of breast: Secondary | ICD-10-CM | POA: Diagnosis not present

## 2020-04-27 DIAGNOSIS — F419 Anxiety disorder, unspecified: Secondary | ICD-10-CM | POA: Diagnosis not present

## 2020-04-27 DIAGNOSIS — I509 Heart failure, unspecified: Secondary | ICD-10-CM | POA: Diagnosis not present

## 2020-04-27 DIAGNOSIS — Z79899 Other long term (current) drug therapy: Secondary | ICD-10-CM | POA: Diagnosis not present

## 2020-04-27 DIAGNOSIS — Z794 Long term (current) use of insulin: Secondary | ICD-10-CM | POA: Diagnosis not present

## 2020-04-27 DIAGNOSIS — D649 Anemia, unspecified: Secondary | ICD-10-CM | POA: Diagnosis not present

## 2020-04-27 DIAGNOSIS — Z800 Family history of malignant neoplasm of digestive organs: Secondary | ICD-10-CM | POA: Diagnosis not present

## 2020-04-27 DIAGNOSIS — K5900 Constipation, unspecified: Secondary | ICD-10-CM | POA: Diagnosis not present

## 2020-04-27 DIAGNOSIS — D539 Nutritional anemia, unspecified: Secondary | ICD-10-CM

## 2020-04-27 DIAGNOSIS — E119 Type 2 diabetes mellitus without complications: Secondary | ICD-10-CM | POA: Diagnosis not present

## 2020-04-27 MED ORDER — SODIUM CHLORIDE 0.9 % IV SOLN
510.0000 mg | Freq: Once | INTRAVENOUS | Status: AC
  Administered 2020-04-27: 510 mg via INTRAVENOUS
  Filled 2020-04-27: qty 17

## 2020-04-27 MED ORDER — SODIUM CHLORIDE 0.9 % IV SOLN
Freq: Once | INTRAVENOUS | Status: AC
  Filled 2020-04-27: qty 250

## 2020-04-27 NOTE — Patient Instructions (Signed)

## 2020-05-05 ENCOUNTER — Ambulatory Visit: Payer: BC Managed Care – PPO

## 2020-05-08 ENCOUNTER — Encounter (HOSPITAL_BASED_OUTPATIENT_CLINIC_OR_DEPARTMENT_OTHER): Payer: BC Managed Care – PPO | Admitting: Internal Medicine

## 2020-05-09 DIAGNOSIS — E113513 Type 2 diabetes mellitus with proliferative diabetic retinopathy with macular edema, bilateral: Secondary | ICD-10-CM | POA: Diagnosis not present

## 2020-05-09 DIAGNOSIS — H3582 Retinal ischemia: Secondary | ICD-10-CM | POA: Diagnosis not present

## 2020-05-15 ENCOUNTER — Inpatient Hospital Stay: Payer: BC Managed Care – PPO | Attending: Hematology and Oncology

## 2020-05-15 ENCOUNTER — Other Ambulatory Visit: Payer: Self-pay

## 2020-05-15 ENCOUNTER — Inpatient Hospital Stay (HOSPITAL_BASED_OUTPATIENT_CLINIC_OR_DEPARTMENT_OTHER): Payer: BC Managed Care – PPO | Admitting: Nurse Practitioner

## 2020-05-15 VITALS — BP 146/89 | HR 92 | Temp 98.1°F | Resp 17 | Wt 179.0 lb

## 2020-05-15 DIAGNOSIS — D509 Iron deficiency anemia, unspecified: Secondary | ICD-10-CM | POA: Diagnosis not present

## 2020-05-15 DIAGNOSIS — D539 Nutritional anemia, unspecified: Secondary | ICD-10-CM

## 2020-05-15 MED ORDER — SODIUM CHLORIDE 0.9 % IV SOLN
Freq: Once | INTRAVENOUS | Status: AC
  Filled 2020-05-15: qty 250

## 2020-05-15 MED ORDER — ACETAMINOPHEN 325 MG PO TABS
650.0000 mg | ORAL_TABLET | Freq: Once | ORAL | Status: AC
  Administered 2020-05-15: 650 mg via ORAL

## 2020-05-15 MED ORDER — ACETAMINOPHEN 325 MG PO TABS
ORAL_TABLET | ORAL | Status: AC
  Filled 2020-05-15: qty 2

## 2020-05-15 MED ORDER — SODIUM CHLORIDE 0.9 % IV SOLN
510.0000 mg | Freq: Once | INTRAVENOUS | Status: AC
  Administered 2020-05-15: 510 mg via INTRAVENOUS
  Filled 2020-05-15: qty 510

## 2020-05-15 NOTE — Patient Instructions (Signed)

## 2020-05-15 NOTE — Progress Notes (Signed)
Approximately 10 minutes after starting the IV feraheme, patient began c/o severe back and leg pain and discomfort.  IV iron infusion stopped.  Burns Spain, NP notified, NP ordered Tylenol $RemoveBeforeD'650mg'[id number]$  po and NS 0.9% wide open for 30 minutes.  After 30 minutes patient stated she was feeling better and feraheme was resumed at half rate.  Patient was able to tolerate remainder of feraheme with no other s/s or c/o distress or discomfort.  VSS.  Patient discharged with belongings.  Pharmacy and MD notified.

## 2020-05-15 NOTE — Progress Notes (Signed)
Patient seen briefly in the infusion room after complaining of back pain during an iron infusion. She reports she has not had this back pain previously and that it was in her legs as well some what to a lesser degree. She specifically denied any other reaction including tachycardia, shortness of breath or itching or any other symptom. We held the Iron for 30 minutes, gave her 650 of Tylenol and resumed infusion and a reduced rate which she then tolerated without incident. She has had prior iron infusions without any reaction or difficulty tolerating.  Burns Spain, NP-C, AOCNP

## 2020-05-17 ENCOUNTER — Telehealth: Payer: Self-pay | Admitting: *Deleted

## 2020-05-17 NOTE — Telephone Encounter (Signed)
Per Dr.Gorsuch, called to f/u with pt in reference to the reaction she previously had to iron iv. Pt stated that she was feeling fatigued, legs ache but not like they were prior. Questioning next iron infusion on 11/12. Messaged sent to Dr.Gorsuch to advise.

## 2020-05-17 NOTE — Telephone Encounter (Signed)
-----   Message from Heath Lark, MD sent at 05/17/2020 10:23 AM EDT ----- Regarding: can you call and ask how she feeling? she had iron reaction

## 2020-05-18 ENCOUNTER — Telehealth: Payer: Self-pay

## 2020-05-18 NOTE — Telephone Encounter (Signed)
Called and reviewed upcoming appts at [facility].  Told her Dr. Alvy Bimler would see and review labs on 11/12 appt. She verbalized understanding.

## 2020-05-19 ENCOUNTER — Other Ambulatory Visit: Payer: Self-pay | Admitting: Physician Assistant

## 2020-05-23 DIAGNOSIS — E041 Nontoxic single thyroid nodule: Secondary | ICD-10-CM | POA: Diagnosis not present

## 2020-05-23 DIAGNOSIS — E049 Nontoxic goiter, unspecified: Secondary | ICD-10-CM | POA: Diagnosis not present

## 2020-05-23 DIAGNOSIS — Z794 Long term (current) use of insulin: Secondary | ICD-10-CM | POA: Diagnosis not present

## 2020-05-23 DIAGNOSIS — E1165 Type 2 diabetes mellitus with hyperglycemia: Secondary | ICD-10-CM | POA: Diagnosis not present

## 2020-05-25 ENCOUNTER — Telehealth: Payer: Self-pay | Admitting: Interventional Cardiology

## 2020-05-25 NOTE — Telephone Encounter (Signed)
Representative with [employer] is calling to following up on status of medical records request. She states the request was faxed over on 04/15/20. Patient's office visit notes are needed for short-term disability eligibility. She states she will resubmit fax. Confirmed our fax number with representative.  Phone number: [phone]

## 2020-05-25 NOTE — Telephone Encounter (Signed)
Insurance requests are currently processed by [employer] at the [facility] building.

## 2020-06-08 ENCOUNTER — Inpatient Hospital Stay: Payer: BC Managed Care – PPO | Attending: Hematology and Oncology

## 2020-06-08 ENCOUNTER — Other Ambulatory Visit: Payer: Self-pay

## 2020-06-08 ENCOUNTER — Other Ambulatory Visit: Payer: Self-pay | Admitting: Hematology and Oncology

## 2020-06-08 DIAGNOSIS — Z79899 Other long term (current) drug therapy: Secondary | ICD-10-CM | POA: Diagnosis not present

## 2020-06-08 DIAGNOSIS — D539 Nutritional anemia, unspecified: Secondary | ICD-10-CM

## 2020-06-08 DIAGNOSIS — E11621 Type 2 diabetes mellitus with foot ulcer: Secondary | ICD-10-CM | POA: Diagnosis not present

## 2020-06-08 DIAGNOSIS — H3582 Retinal ischemia: Secondary | ICD-10-CM | POA: Diagnosis not present

## 2020-06-08 DIAGNOSIS — K5900 Constipation, unspecified: Secondary | ICD-10-CM | POA: Insufficient documentation

## 2020-06-08 DIAGNOSIS — D509 Iron deficiency anemia, unspecified: Secondary | ICD-10-CM | POA: Insufficient documentation

## 2020-06-08 DIAGNOSIS — Z794 Long term (current) use of insulin: Secondary | ICD-10-CM | POA: Insufficient documentation

## 2020-06-08 DIAGNOSIS — M898X9 Other specified disorders of bone, unspecified site: Secondary | ICD-10-CM | POA: Insufficient documentation

## 2020-06-08 DIAGNOSIS — E113513 Type 2 diabetes mellitus with proliferative diabetic retinopathy with macular edema, bilateral: Secondary | ICD-10-CM | POA: Diagnosis not present

## 2020-06-08 LAB — IRON AND TIBC
Iron: 67 ug/dL (ref 41–142)
Saturation Ratios: 30 % (ref 21–57)
TIBC: 220 ug/dL — ABNORMAL LOW (ref 236–444)
UIBC: 153 ug/dL (ref 120–384)

## 2020-06-08 LAB — CBC WITH DIFFERENTIAL/PLATELET
Abs Immature Granulocytes: 0.03 10*3/uL (ref 0.00–0.07)
Basophils Absolute: 0 10*3/uL (ref 0.0–0.1)
Basophils Relative: 0 %
Eosinophils Absolute: 0.1 10*3/uL (ref 0.0–0.5)
Eosinophils Relative: 1 %
HCT: 33.7 % — ABNORMAL LOW (ref 36.0–46.0)
Hemoglobin: 11.1 g/dL — ABNORMAL LOW (ref 12.0–15.0)
Immature Granulocytes: 1 %
Lymphocytes Relative: 34 %
Lymphs Abs: 1.6 10*3/uL (ref 0.7–4.0)
MCH: 28.9 pg (ref 26.0–34.0)
MCHC: 32.9 g/dL (ref 30.0–36.0)
MCV: 87.8 fL (ref 80.0–100.0)
Monocytes Absolute: 0.3 10*3/uL (ref 0.1–1.0)
Monocytes Relative: 6 %
Neutro Abs: 2.7 10*3/uL (ref 1.7–7.7)
Neutrophils Relative %: 58 %
Platelets: 212 10*3/uL (ref 150–400)
RBC: 3.84 MIL/uL — ABNORMAL LOW (ref 3.87–5.11)
RDW: 13.5 % (ref 11.5–15.5)
WBC: 4.6 10*3/uL (ref 4.0–10.5)
nRBC: 0 % (ref 0.0–0.2)

## 2020-06-08 LAB — RETICULOCYTES
Immature Retic Fract: 11.8 % (ref 2.3–15.9)
RBC.: 3.86 MIL/uL — ABNORMAL LOW (ref 3.87–5.11)
Retic Count, Absolute: 43.6 10*3/uL (ref 19.0–186.0)
Retic Ct Pct: 1.1 % (ref 0.4–3.1)

## 2020-06-08 LAB — FERRITIN: Ferritin: 245 ng/mL (ref 11–307)

## 2020-06-08 LAB — VITAMIN B12: Vitamin B-12: 359 pg/mL (ref 180–914)

## 2020-06-08 LAB — SEDIMENTATION RATE: Sed Rate: 25 mm/hr — ABNORMAL HIGH (ref 0–22)

## 2020-06-09 DIAGNOSIS — E1165 Type 2 diabetes mellitus with hyperglycemia: Secondary | ICD-10-CM | POA: Diagnosis not present

## 2020-06-12 ENCOUNTER — Encounter: Payer: Self-pay | Admitting: Physician Assistant

## 2020-06-12 ENCOUNTER — Other Ambulatory Visit: Payer: Self-pay

## 2020-06-12 ENCOUNTER — Emergency Department (HOSPITAL_COMMUNITY): Payer: BC Managed Care – PPO

## 2020-06-12 ENCOUNTER — Emergency Department (HOSPITAL_COMMUNITY)
Admission: EM | Admit: 2020-06-12 | Discharge: 2020-06-12 | Disposition: A | Discharge status: Home / Self Care | Payer: BC Managed Care – PPO | Attending: Emergency Medicine | Admitting: Emergency Medicine

## 2020-06-12 ENCOUNTER — Telehealth (INDEPENDENT_AMBULATORY_CARE_PROVIDER_SITE_OTHER): Payer: BC Managed Care – PPO | Admitting: Physician Assistant

## 2020-06-12 DIAGNOSIS — E860 Dehydration: Secondary | ICD-10-CM | POA: Diagnosis not present

## 2020-06-12 DIAGNOSIS — J45909 Unspecified asthma, uncomplicated: Secondary | ICD-10-CM | POA: Diagnosis not present

## 2020-06-12 DIAGNOSIS — Z794 Long term (current) use of insulin: Secondary | ICD-10-CM | POA: Insufficient documentation

## 2020-06-12 DIAGNOSIS — A084 Viral intestinal infection, unspecified: Secondary | ICD-10-CM | POA: Insufficient documentation

## 2020-06-12 DIAGNOSIS — R002 Palpitations: Secondary | ICD-10-CM | POA: Diagnosis not present

## 2020-06-12 DIAGNOSIS — I509 Heart failure, unspecified: Secondary | ICD-10-CM | POA: Diagnosis not present

## 2020-06-12 DIAGNOSIS — R112 Nausea with vomiting, unspecified: Secondary | ICD-10-CM

## 2020-06-12 DIAGNOSIS — R000 Tachycardia, unspecified: Secondary | ICD-10-CM | POA: Diagnosis not present

## 2020-06-12 DIAGNOSIS — E109 Type 1 diabetes mellitus without complications: Secondary | ICD-10-CM | POA: Insufficient documentation

## 2020-06-12 DIAGNOSIS — R079 Chest pain, unspecified: Secondary | ICD-10-CM | POA: Diagnosis not present

## 2020-06-12 LAB — CBC
HCT: 33.1 % — ABNORMAL LOW (ref 36.0–46.0)
Hemoglobin: 11 g/dL — ABNORMAL LOW (ref 12.0–15.0)
MCH: 29.7 pg (ref 26.0–34.0)
MCHC: 33.2 g/dL (ref 30.0–36.0)
MCV: 89.5 fL (ref 80.0–100.0)
Platelets: 238 10*3/uL (ref 150–400)
RBC: 3.7 MIL/uL — ABNORMAL LOW (ref 3.87–5.11)
RDW: 13.5 % (ref 11.5–15.5)
WBC: 6.3 10*3/uL (ref 4.0–10.5)
nRBC: 0 % (ref 0.0–0.2)

## 2020-06-12 LAB — TROPONIN I (HIGH SENSITIVITY): Troponin I (High Sensitivity): 5 ng/L (ref <18)

## 2020-06-12 LAB — BASIC METABOLIC PANEL
Anion gap: 5 (ref 5–15)
BUN: 21 mg/dL — ABNORMAL HIGH (ref 6–20)
CO2: 27 mmol/L (ref 22–32)
Calcium: 8.7 mg/dL — ABNORMAL LOW (ref 8.9–10.3)
Chloride: 109 mmol/L (ref 98–111)
Creatinine, Ser: 0.81 mg/dL (ref 0.44–1.00)
GFR, Estimated: >60 mL/min (ref >60)
Glucose, Bld: 93 mg/dL (ref 70–99)
Potassium: 4.1 mmol/L (ref 3.5–5.1)
Sodium: 141 mmol/L (ref 135–145)

## 2020-06-12 LAB — CBG MONITORING, ED: Glucose-Capillary: 109 mg/dL — ABNORMAL HIGH (ref 70–99)

## 2020-06-12 LAB — GLUCOSE, CAPILLARY: Glucose-Capillary: 66 mg/dL — ABNORMAL LOW (ref 70–99)

## 2020-06-12 LAB — I-STAT BETA HCG BLOOD, ED (MC, WL, AP ONLY): I-stat hCG, quantitative: <5 m[IU]/mL (ref <5)

## 2020-06-12 MED ORDER — ONDANSETRON 4 MG PO TBDP: 4.0000 mg | ORAL_TABLET | Freq: Three times a day (TID) | ORAL | 0 refills | Status: DC | PRN

## 2020-06-12 MED ORDER — SODIUM CHLORIDE 0.9 % IV BOLUS
1000.0000 mL | Freq: Once | INTRAVENOUS | Status: AC
  Administered 2020-06-12: 1000 mL via INTRAVENOUS

## 2020-06-12 MED ORDER — ONDANSETRON HCL 4 MG/2ML IJ SOLN
4.0000 mg | Freq: Once | INTRAMUSCULAR | Status: AC
  Administered 2020-06-12: 4 mg via INTRAVENOUS
  Filled 2020-06-12: qty 2

## 2020-06-12 NOTE — Progress Notes (Signed)
I have discussed the procedure for the virtual visit with the patient who has given consent to proceed with assessment and treatment.   Amandy Chubbuck S Irma Delancey, CMA     

## 2020-06-12 NOTE — Progress Notes (Signed)
Virtual Visit via Video   I connected with patient on 06/12/20 at  9:00 AM EST by a video enabled telemedicine application and verified that I am speaking with the correct person using two identifiers.  Location patient: Home Location provider: [facility], Office Persons participating in the virtual visit: Patient, Provider, [place] (Patina Moore)  I discussed the limitations of evaluation and management by telemedicine and the availability of in person appointments. The patient expressed understanding and agreed to proceed.  Subjective:   HPI:   Patient presents via [place] today c/o abrupt yesterday with vomiting. Up all night and morning -- every 30 minutes having significant emesis. Non-bloody.. Trying to keep hydrated but cannot keep anything in. Denies fever. Has noted chills and severe fatigue with weakness. Unable to take her heart medication due to vomiting. Also had constipation x 3 days due to iron infusion but now having loose, frequent stools. Denies any known sick contact. Notes palpitations -- episodes of atrial fibrillation. Sees Cardiology but no appointment until the end of the month. Does note chest pain at times.  ROS:   See pertinent positives and negatives per HPI.  Patient Active Problem List   Diagnosis Date Noted  . Deficiency anemia 04/21/2020  . Preventive measure 04/21/2020  . Heart failure with normal ejection fraction ([place]) 12/14/2019  . Abdominal pain, epigastric 12/02/2019  . Constipation 12/02/2019  . Loss of weight 12/02/2019  . Leg edema 11/21/2019  . Uncontrolled type 1 diabetes mellitus with hyperglycemia, with long-term current use of insulin ([place]) 10/11/2019  . Dysfunctional uterine bleeding 10/11/2019  . Mild renal insufficiency 10/11/2019  . 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[place]) 07/30/2016  . Multiple thyroid nodules 07/30/2016   . Complicated grieving 57/26/2035  . Ulcers of both lower legs ([place]) 02/20/2014  . Health maintenance examination 09/20/2013  . Diabetic ulcer of left foot ([place]) 05/12/2013  . Diabetic foot ulcers ([place]) 05/01/2013  . Skin rash 01/05/2013  . Contraception management 12/31/2012  . Mood disorder ([place]) 12/30/2012  . Unspecified vitamin D deficiency 12/03/2012  . H/O thyroid nodule 12/03/2012  . Type 1 diabetes mellitus with diabetic neuropathy ([place]) 06/18/2007    Social History   Tobacco Use  . Smoking status: Never Smoker  . Smokeless tobacco: Never Used  Substance Use Topics  . Alcohol use: Not Currently    Current Outpatient Medications:  .  albuterol (PROAIR HFA) 108 (90 Base) MCG/ACT inhaler, Inhale 2 puffs into the lungs every 4 (four) hours as needed for wheezing or shortness of breath. , Disp: , Rfl:  .  atorvastatin (LIPITOR) 10 MG tablet, Take 10 mg by mouth daily., Disp: , Rfl:  .  Betamethasone Valerate 0.12 % foam, Apply thin layer topically twice daily to areas of hair loss., Disp: 100 g, Rfl: 0 .  carvedilol (COREG) 6.25 MG tablet, Take 1 tablet (6.25 mg total) by mouth 2 (two) times daily., Disp: 180 tablet, Rfl: 3 .  Continuous Blood Gluc Sensor (FREESTYLE LIBRE 2 SENSOR) MISC, CHANGE EVERY 14 DAYS, Disp: , Rfl:  .  hydrOXYzine (ATARAX/VISTARIL) 25 MG tablet, Take 1 tablet (25 mg total) by mouth 3 (three) times daily as needed., Disp: 60 tablet, Rfl: 2 .  Insulin Disposable Pump (OMNIPOD 10 PACK) MISC, by Does not apply route., Disp: , Rfl:  .  LORazepam (ATIVAN) 0.5 MG tablet, TAKE 1 TABLET BY MOUTH EVERY 8 HOURS AS NEEDED FOR  ANXIETY., Disp: 20 tablet, Rfl: 0 .  NOVOLOG 100 UNIT/ML injection, SMARTSIG:0-120 Unit(s) SUB-Q Daily, Disp: , Rfl:  .  tobramycin (TOBREX) 0.3 % ophthalmic solution, SMARTSIG:In Eye(s), Disp: , Rfl:  .  torsemide (DEMADEX) 20 MG tablet, Take 1 tablet (20 mg total) by mouth every Monday, Wednesday, and Friday., Disp: 45 tablet, Rfl:  3  Allergies  Allergen Reactions  . Amoxicillin Itching  . Lantus [Insulin Glargine] Itching and Rash    Objective:   There were no vitals taken for this visit.  Patient is well-developed, in mild distress. Resting on her bed at home.  Head is normocephalic, atraumatic.  No labored breathing.  Speech is clear and coherent with logical content.  Patient is alert and oriented at baseline.   Assessment and Plan:   1. Palpitation 2. Intractable vomiting with nausea, unspecified vomiting type 3. Dehydration ER triage given to patient giving her current symptoms, inability to keep in fluids PO and comorbid medical conditions. She agrees to have her friend take her to the ER. Declines EMS.     [name], PA-C 06/12/2020

## 2020-06-12 NOTE — ED Triage Notes (Signed)
Patient reports to the ER for emesis since yesterday at 3pm. Patient reports feeling light headed and dizzy. Patient reports her chest hurts and she has a-fib

## 2020-06-12 NOTE — ED Notes (Signed)
Pt tolerated graham crackers and juice well. Will recheck CBG in 30 minutes

## 2020-06-12 NOTE — ED Notes (Signed)
ED Provider at bedside. 

## 2020-06-12 NOTE — ED Provider Notes (Signed)
[facility] EMERGENCY DEPARTMENT Provider Note  CSN: [id number] Arrival date & time: 06/12/20 1055    History Chief Complaint  Patient presents with  . Emesis  . Dizziness    HPI  Onalee Steinbach is a 49 y.o. female with history of atrial fibrillation and diabetes was in her normal state of health until yesterday after lunch when she began to have multiple episodes of vomiting, non bloody emesis, unable to keep anything down and also having watery diarrhea through the night. Denies any fever. She has had some abdominal cramping, but otherwise no pain. She has an insulin pump and a continuous glucose monitor, she has noted her sugars getting lower while waiting for an ED room. She was given juice and crackers/peanut butter which she was able to eat without vomiting.    Past Medical History:  Diagnosis Date  . Anemia   . Anxiety   . Arthritis   . Asthma   . CHF (congestive heart failure) ([place])   . Depression   . History of chicken pox   . Migraines   . Mood disorder (HCC)    anxiety  . Prurigo nodularis    with diabetic dermopathy  . Type 1 diabetes, uncontrolled, with neuropathy ([place])    [place]    Past Surgical History:  Procedure Laterality Date  . treadmill stress test  01/2013   WNL, low risk study    Family History  Problem Relation Age of Onset  . Diabetes Mother        type 2  . Hypertension Mother   . Thyroid disease Mother   . Bipolar disorder Mother   . Heart disease Mother   . Calcium disorder Mother   . Cancer Maternal Grandmother        Breast, stomach  . Cancer Paternal Grandmother        stomach, lung (smoker)  . Diabetes Paternal Grandmother   . Diabetes Paternal Grandfather   . Heart failure Sister   . Diabetes Sister   . Stroke Maternal Aunt   . Cancer Maternal Uncle        prostate  . CAD Maternal Aunt        stents  . Cancer Maternal Aunt 64       ovarian    Social History   Tobacco Use  . Smoking status: Never Smoker   . Smokeless tobacco: Never Used  Vaping Use  . Vaping Use: Never used  Substance Use Topics  . Alcohol use: Not Currently  . Drug use: No     Home Medications Prior to Admission medications   Medication Sig Start Date End Date Taking? Authorizing Provider  albuterol (PROAIR HFA) 108 (90 Base) MCG/ACT inhaler Inhale 2 puffs into the lungs every 4 (four) hours as needed for wheezing or shortness of breath.     [provider]  atorvastatin (LIPITOR) 10 MG tablet Take 10 mg by mouth daily.    [provider]  Betamethasone Valerate 0.12 % foam Apply thin layer topically twice daily to areas of hair loss. 04/17/20   [name], PA-C  carvedilol (COREG) 6.25 MG tablet Take 1 tablet (6.25 mg total) by mouth 2 (two) times daily. 04/14/20   [name], MD  Continuous Blood Gluc Sensor (FREESTYLE LIBRE 2 SENSOR) MISC CHANGE EVERY 14 DAYS 03/20/20   [provider]  hydrOXYzine (ATARAX/VISTARIL) 25 MG tablet Take 1 tablet (25 mg total) by mouth 3 (three) times daily as needed. 04/17/20  [name], PA-C  Insulin Disposable Pump (OMNIPOD 10 PACK) MISC by Does not apply route.    [provider]  LORazepam (ATIVAN) 0.5 MG tablet TAKE 1 TABLET BY MOUTH EVERY 8 HOURS AS NEEDED FOR ANXIETY. 05/22/20   [name], PA-C  NOVOLOG 100 UNIT/ML injection SMARTSIG:0-120 Unit(s) SUB-Q Daily 04/06/20   [provider]  ondansetron (ZOFRAN ODT) 4 MG disintegrating tablet Take 1 tablet (4 mg total) by mouth every 8 (eight) hours as needed for nausea or vomiting. 06/12/20   Truddie Hidden, MD  tobramycin (TOBREX) 0.3 % ophthalmic solution SMARTSIG:In Eye(s) 03/29/20   [provider]  torsemide (DEMADEX) 20 MG tablet Take 1 tablet (20 mg total) by mouth every Monday, Wednesday, and Friday. 03/24/20   [name], MD     Allergies    Amoxicillin and Lantus [insulin glargine]   Review of Systems   Review of Systems A comprehensive  review of systems was completed and negative except as noted in HPI.    Physical Exam BP (!) 157/106 (BP Location: Right Arm)   Pulse 91   Temp 98.7 F (37.1 C)   Resp 18   LMP 05/12/2020   SpO2 100%   Physical Exam Vitals and nursing note reviewed.  Constitutional:      Appearance: Normal appearance.  HENT:     Head: Normocephalic and atraumatic.     Nose: Nose normal.     Mouth/Throat:     Mouth: Mucous membranes are dry.  Eyes:     Extraocular Movements: Extraocular movements intact.     Conjunctiva/sclera: Conjunctivae normal.  Cardiovascular:     Rate and Rhythm: Tachycardia present.  Pulmonary:     Effort: Pulmonary effort is normal.     Breath sounds: Normal breath sounds.  Abdominal:     General: Abdomen is flat.     Palpations: Abdomen is soft.     Tenderness: There is no abdominal tenderness.  Musculoskeletal:        General: No swelling. Normal range of motion.     Cervical back: Neck supple.  Skin:    General: Skin is warm and dry.  Neurological:     General: No focal deficit present.     Mental Status: She is alert.  Psychiatric:        Mood and Affect: Mood normal.      ED Results / Procedures / Treatments   Labs (all labs ordered are listed, but only abnormal results are displayed) Labs Reviewed  BASIC METABOLIC PANEL - Abnormal; Notable for the following components:      Result Value   BUN 21 (*)    Calcium 8.7 (*)    All other components within normal limits  CBC - Abnormal; Notable for the following components:   RBC 3.70 (*)    Hemoglobin 11.0 (*)    HCT 33.1 (*)    All other components within normal limits  GLUCOSE, CAPILLARY - Abnormal; Notable for the following components:   Glucose-Capillary 66 (*)    All other components within normal limits  CBG MONITORING, ED - Abnormal; Notable for the following components:   Glucose-Capillary 109 (*)    All other components within normal limits  I-STAT BETA HCG BLOOD, ED ([facility], [facility], [facility] ONLY)    TROPONIN I (HIGH SENSITIVITY)    EKG EKG Interpretation  Date/Time:  Monday June 12 2020 11:19:30 EST Ventricular Rate:  106 PR Interval:  136 QRS Duration: 72 QT Interval:  340 QTC Calculation: 451 R Axis:   68 Text Interpretation: Sinus tachycardia Possible Left atrial enlargement Nonspecific T wave abnormality Abnormal ECG No significant change since last tracing Confirmed by Calvert Cantor [phone]) on 06/12/2020 2:00:09 PM   Radiology DG Chest 2 View  Result Date: 06/12/2020 CLINICAL DATA:  Chest pain EXAM: CHEST - 2 VIEW COMPARISON:  03/29/2020 FINDINGS: The heart size and mediastinal contours are within normal limits. Both lungs are clear. The visualized skeletal structures are unremarkable. IMPRESSION: No active cardiopulmonary disease. Electronically Signed   By: Kerby Moors M.D.   On: 06/12/2020 12:35    Procedures Procedures  Medications Ordered in the ED Medications  sodium chloride 0.9 % bolus 1,000 mL (1,000 mLs Intravenous New Bag/Given 06/12/20 1428)  ondansetron (ZOFRAN) injection 4 mg (4 mg Intravenous Given 06/12/20 1429)     MDM Rules/Calculators/A&P MDM Patient with vomiting and diarrhea since yesterday. She is tachycardic but regular and EKG does not show afib. She had labs done in triage and no concerning findings. Glucose is borderline low, but she has been able to eat since arrival. Will give IVF and Zofran and reassess.  ED Course  I have reviewed the triage vital signs and the nursing notes.  Pertinent labs & imaging results that were available during my care of the patient were reviewed by me and considered in my medical decision making (see chart for details).  Clinical Course as of Jun 12 1554  Mon Jun 12, 2020  1401 CBC with mild anemia, about at baseline.    [CS]  1401 BMP normal. Not pregnant   [CS]  1401 Trop neg.    [CS]  1526 Patient reports she is feeling better after Zofran. Labs otherwise look well and she is tolerating PO  Fluid. She would like to go home. Rx for Zofran at home. Likely she has a viral gastroenteritis, recommend PCP follow up or RTED for any worsening.    [CS]    Clinical Course User Index [CS] Truddie Hidden, MD    Final Clinical Impression(s) / ED Diagnoses Final diagnoses:  Viral gastroenteritis    Rx / DC Orders ED Discharge Orders         Ordered    ondansetron (ZOFRAN ODT) 4 MG disintegrating tablet  Every 8 hours PRN        06/12/20 1528           Truddie Hidden, MD 06/12/20 1555

## 2020-06-16 ENCOUNTER — Telehealth: Payer: Self-pay | Admitting: Hematology and Oncology

## 2020-06-16 ENCOUNTER — Inpatient Hospital Stay: Payer: BC Managed Care – PPO

## 2020-06-16 ENCOUNTER — Inpatient Hospital Stay (HOSPITAL_BASED_OUTPATIENT_CLINIC_OR_DEPARTMENT_OTHER): Payer: BC Managed Care – PPO | Admitting: Hematology and Oncology

## 2020-06-16 ENCOUNTER — Other Ambulatory Visit: Payer: Self-pay

## 2020-06-16 ENCOUNTER — Encounter: Payer: Self-pay | Admitting: Hematology and Oncology

## 2020-06-16 DIAGNOSIS — D539 Nutritional anemia, unspecified: Secondary | ICD-10-CM

## 2020-06-16 DIAGNOSIS — M898X9 Other specified disorders of bone, unspecified site: Secondary | ICD-10-CM

## 2020-06-16 DIAGNOSIS — K5900 Constipation, unspecified: Secondary | ICD-10-CM | POA: Diagnosis not present

## 2020-06-16 DIAGNOSIS — Z79899 Other long term (current) drug therapy: Secondary | ICD-10-CM | POA: Diagnosis not present

## 2020-06-16 DIAGNOSIS — L97919 Non-pressure chronic ulcer of unspecified part of right lower leg with unspecified severity: Secondary | ICD-10-CM

## 2020-06-16 DIAGNOSIS — Z794 Long term (current) use of insulin: Secondary | ICD-10-CM | POA: Diagnosis not present

## 2020-06-16 DIAGNOSIS — D509 Iron deficiency anemia, unspecified: Secondary | ICD-10-CM | POA: Diagnosis not present

## 2020-06-16 DIAGNOSIS — L97929 Non-pressure chronic ulcer of unspecified part of left lower leg with unspecified severity: Secondary | ICD-10-CM | POA: Diagnosis not present

## 2020-06-16 DIAGNOSIS — E11621 Type 2 diabetes mellitus with foot ulcer: Secondary | ICD-10-CM | POA: Diagnosis not present

## 2020-06-16 NOTE — Assessment & Plan Note (Signed)
She has diffuse bone pain since last iron treatment This is unrelated I wonder if she could have vitamin D deficiency I recommend she resume taking vitamin D supplement We can check her vitamin D level in her next visit

## 2020-06-16 NOTE — Assessment & Plan Note (Signed)
This is likely related to her diabetes She will continue aggressive management of diabetes and wound care

## 2020-06-16 NOTE — Assessment & Plan Note (Signed)
I reviewed recent blood work with her Her iron studies are adequate She remained persistently anemic despite correction of iron deficiency I shared with her the cause of her anemia is multifactorial, with correction of iron deficiency, it is likely caused by anemia of chronic illness Her sedimentation rate is still elevated likely due to her leg ulcer There is no indication to proceed with further intravenous iron infusion today I plan to bring her back again in 3 months for further follow-up

## 2020-06-16 NOTE — Progress Notes (Signed)
[facility] OFFICE PROGRESS NOTE  [name], PA-C  ASSESSMENT & PLAN:  Deficiency anemia I reviewed recent blood work with her Her iron studies are adequate She remained persistently anemic despite correction of iron deficiency I shared with her the cause of her anemia is multifactorial, with correction of iron deficiency, it is likely caused by anemia of chronic illness Her sedimentation rate is still elevated likely due to her leg ulcer There is no indication to proceed with further intravenous iron infusion today I plan to bring her back again in 3 months for further follow-up  Ulcers of both lower legs ([place]) This is likely related to her diabetes She will continue aggressive management of diabetes and wound care  Bone pain She has diffuse bone pain since last iron treatment This is unrelated I wonder if she could have vitamin D deficiency I recommend she resume taking vitamin D supplement We can check her vitamin D level in her next visit   Orders Placed This Encounter  Procedures  . Iron and TIBC    Standing Status:   Standing    Number of Occurrences:   8    Standing Expiration Date:   06/16/2021  . Ferritin    Standing Status:   Standing    Number of Occurrences:   8    Standing Expiration Date:   06/16/2021  . VITAMIN D 25 Hydroxy (Vit-D Deficiency, Fractures)    Standing Status:   Standing    Number of Occurrences:   2    Standing Expiration Date:   06/16/2021    The total time spent in the appointment was 15 minutes encounter with patients including review of chart and various tests results, discussions about plan of care and coordination of care plan   All questions were answered. The patient knows to call the clinic with any problems, questions or concerns. No barriers to learning was detected.    Heath Lark, MD 11/12/20219:50 AM  INTERVAL HISTORY: [name] 49 y.o. female returns for further follow-up on anemia She  developed some reaction to IV iron but subsequently completed treatment without difficulties Her energy level has somewhat improved She complained of diffuse bone pain The patient denies any recent signs or symptoms of bleeding such as spontaneous epistaxis, hematuria or hematochezia.  SUMMARY OF HEMATOLOGIC HISTORY: [name] is here because of recent findings of anemia The patient has insulin-dependent diabetes for many years She was recently found to have chronic anemia  Her baseline hemoglobin from 10 years ago were within normal limits Starting late last year, she has progressive anemia Her recent blood work showed hemoglobin of 9 with iron deficiency The patient have history of poorly controlled diabetes She had significant leg ulcers 5 years ago that was successfully treated at the wound care center Approximately a year ago, she started to have significant ulcer on both legs with open discharge at times and bleeding  She denies recent chest pain on exertion, shortness of breath on minimal exertion, pre-syncopal episodes, or palpitations. She had not noticed any recent bleeding such as epistaxis, hematuria or hematochezia The patient denies over the counter NSAID ingestion. She is not on antiplatelets agents.  She had no prior history or diagnosis of cancer. Her age appropriate screening programs are up-to-date. She denies has pica with excessive chewing of ice and eats a variety of diet. She never donated blood or received blood transfusion The patient was prescribed oral iron supplements and she takes them in  the past complicated by severe nausea and constipation   I have reviewed the past medical history, past surgical history, social history and family history with the patient and they are unchanged from previous note.  ALLERGIES:  is allergic to amoxicillin and lantus [insulin glargine].  MEDICATIONS:  Current Outpatient Medications  Medication Sig Dispense  Refill  . albuterol (PROAIR HFA) 108 (90 Base) MCG/ACT inhaler Inhale 2 puffs into the lungs every 4 (four) hours as needed for wheezing or shortness of breath.     Marland Kitchen atorvastatin (LIPITOR) 10 MG tablet Take 10 mg by mouth daily.    . Betamethasone Valerate 0.12 % foam Apply thin layer topically twice daily to areas of hair loss. 100 g 0  . carvedilol (COREG) 6.25 MG tablet Take 1 tablet (6.25 mg total) by mouth 2 (two) times daily. 180 tablet 3  . Continuous Blood Gluc Sensor (FREESTYLE LIBRE 2 SENSOR) MISC CHANGE EVERY 14 DAYS    . hydrOXYzine (ATARAX/VISTARIL) 25 MG tablet Take 1 tablet (25 mg total) by mouth 3 (three) times daily as needed. 60 tablet 2  . Insulin Disposable Pump (OMNIPOD 10 PACK) MISC by Does not apply route.    Marland Kitchen LORazepam (ATIVAN) 0.5 MG tablet TAKE 1 TABLET BY MOUTH EVERY 8 HOURS AS NEEDED FOR ANXIETY. 20 tablet 0  . NOVOLOG 100 UNIT/ML injection SMARTSIG:0-120 Unit(s) SUB-Q Daily    . ondansetron (ZOFRAN ODT) 4 MG disintegrating tablet Take 1 tablet (4 mg total) by mouth every 8 (eight) hours as needed for nausea or vomiting. 20 tablet 0  . tobramycin (TOBREX) 0.3 % ophthalmic solution SMARTSIG:In Eye(s)    . torsemide (DEMADEX) 20 MG tablet Take 1 tablet (20 mg total) by mouth every Monday, Wednesday, and Friday. 45 tablet 3   No current facility-administered medications for this visit.     REVIEW OF SYSTEMS:   Constitutional: Denies fevers, chills or night sweats Eyes: Denies blurriness of vision Ears, nose, mouth, throat, and face: Denies mucositis or sore throat Respiratory: Denies cough, dyspnea or wheezes Cardiovascular: Denies palpitation, chest discomfort or lower extremity swelling Gastrointestinal:  Denies nausea, heartburn or change in bowel habits Skin: Denies abnormal skin rashes Lymphatics: Denies new lymphadenopathy or easy bruising Neurological:Denies numbness, tingling or new weaknesses Behavioral/Psych: Mood is stable, no new changes  All other  systems were reviewed with the patient and are negative.  PHYSICAL EXAMINATION: ECOG PERFORMANCE STATUS: 1 - Symptomatic but completely ambulatory  Vitals:   06/16/20 0842  BP: (!) 149/90  Pulse: 94  Resp: 18  Temp: (!) 97.4 F (36.3 C)  SpO2: 100%   Filed Weights   06/16/20 0842  Weight: 183 lb 3.2 oz (83.1 kg)    GENERAL:alert, no distress and comfortable NEURO: alert & oriented x 3 with fluent speech, no focal motor/sensory deficits  LABORATORY DATA:  I have reviewed the data as listed     Component Value Date/Time   NA 141 06/12/2020 1148   NA 138 01/31/2020 1008   K 4.1 06/12/2020 1148   CL 109 06/12/2020 1148   CO2 27 06/12/2020 1148   GLUCOSE 93 06/12/2020 1148   BUN 21 (H) 06/12/2020 1148   BUN 22 01/31/2020 1008   CREATININE 0.81 06/12/2020 1148   CALCIUM 8.7 (L) 06/12/2020 1148   PROT 5.6 (L) 03/27/2020 1136   PROT 5.5 (L) 11/24/2019 0934   ALBUMIN 2.9 (L) 03/27/2020 1136   ALBUMIN 3.0 (L) 11/24/2019 0934   AST 12 03/27/2020 1136   ALT 10  03/27/2020 1136   ALKPHOS 57 03/27/2020 1136   BILITOT 0.7 03/27/2020 1136   BILITOT 0.3 11/24/2019 0934   GFRNONAA >60 06/12/2020 1148   GFRAA 102 01/31/2020 1008    No results found for: SPEP, UPEP  Lab Results  Component Value Date   WBC 6.3 06/12/2020   NEUTROABS 2.7 06/08/2020   HGB 11.0 (L) 06/12/2020   HCT 33.1 (L) 06/12/2020   MCV 89.5 06/12/2020   PLT 238 06/12/2020      Chemistry      Component Value Date/Time   NA 141 06/12/2020 1148   NA 138 01/31/2020 1008   K 4.1 06/12/2020 1148   CL 109 06/12/2020 1148   CO2 27 06/12/2020 1148   BUN 21 (H) 06/12/2020 1148   BUN 22 01/31/2020 1008   CREATININE 0.81 06/12/2020 1148      Component Value Date/Time   CALCIUM 8.7 (L) 06/12/2020 1148   ALKPHOS 57 03/27/2020 1136   AST 12 03/27/2020 1136   ALT 10 03/27/2020 1136   BILITOT 0.7 03/27/2020 1136   BILITOT 0.3 11/24/2019 0934

## 2020-06-16 NOTE — Telephone Encounter (Signed)
Scheduled appointments per 11/12 sch msg. Spoke to patient who is aware of appointment date and time.

## 2020-06-19 ENCOUNTER — Other Ambulatory Visit: Payer: Self-pay | Admitting: Interventional Cardiology

## 2020-06-26 ENCOUNTER — Other Ambulatory Visit: Payer: Self-pay | Admitting: Internal Medicine

## 2020-06-26 ENCOUNTER — Other Ambulatory Visit: Payer: Self-pay

## 2020-06-26 ENCOUNTER — Encounter: Payer: Self-pay | Admitting: Interventional Cardiology

## 2020-06-26 ENCOUNTER — Ambulatory Visit (INDEPENDENT_AMBULATORY_CARE_PROVIDER_SITE_OTHER): Payer: BC Managed Care – PPO | Admitting: Interventional Cardiology

## 2020-06-26 VITALS — BP 130/90 | HR 95 | Ht 72.0 in | Wt 186.4 lb

## 2020-06-26 DIAGNOSIS — R809 Proteinuria, unspecified: Secondary | ICD-10-CM

## 2020-06-26 DIAGNOSIS — I951 Orthostatic hypotension: Secondary | ICD-10-CM | POA: Diagnosis not present

## 2020-06-26 DIAGNOSIS — Z7189 Other specified counseling: Secondary | ICD-10-CM

## 2020-06-26 DIAGNOSIS — I5032 Chronic diastolic (congestive) heart failure: Secondary | ICD-10-CM | POA: Diagnosis not present

## 2020-06-26 DIAGNOSIS — R0600 Dyspnea, unspecified: Secondary | ICD-10-CM

## 2020-06-26 DIAGNOSIS — E782 Mixed hyperlipidemia: Secondary | ICD-10-CM

## 2020-06-26 DIAGNOSIS — E108 Type 1 diabetes mellitus with unspecified complications: Secondary | ICD-10-CM

## 2020-06-26 DIAGNOSIS — R0609 Other forms of dyspnea: Secondary | ICD-10-CM

## 2020-06-26 MED ORDER — CARVEDILOL 6.25 MG PO TABS
6.2500 mg | ORAL_TABLET | Freq: Two times a day (BID) | ORAL | 3 refills | Status: DC
Start: 2020-06-26 — End: 2020-08-26

## 2020-06-26 NOTE — Patient Instructions (Signed)

## 2020-06-26 NOTE — Addendum Note (Signed)
Addended by: Loren Racer on: 06/26/2020 11:12 AM   Modules accepted: Orders

## 2020-06-26 NOTE — Progress Notes (Signed)
Cardiology Office Note:    Date:  06/26/2020   ID:  Sherran Margolis, DOB Apr 11, 1971, MRN [id number]  PCP:  [name], PA-C  Cardiologist:  Sinclair Grooms, MD   Referring MD: [name], PA-C   Chief Complaint  Patient presents with  . Congestive Heart Failure    Diastolic  . Advice Only    Orthostatic hypotension    History of Present Illness:    [name] is a 49 y.o. female with a hx of a hx of acute on chronic diastolic heart failure, essential hypertension,type 1 diabetes mellitus, migraine headaches,and hypotension on Imdur/heart failure therapy associated with syncope.  [name] is stabilized.  She is back at work.  She still has intermittent stabbing local right lateral chest and axillary discomfort.  Happen spontaneously and is not exertion related.  Orthopnea and chronic lower extremity edema has resolved.  She stands up to 9 hours at work, which causes her to have some edema at the end of each day.  She is currently on Demadex 20 mg on Monday Wednesday and Friday.  Past Medical History:  Diagnosis Date  . Anemia   . Anxiety   . Arthritis   . Asthma   . CHF (congestive heart failure) ([place])   . Depression   . History of chicken pox   . Migraines   . Mood disorder (HCC)    anxiety  . Prurigo nodularis    with diabetic dermopathy  . Type 1 diabetes, uncontrolled, with neuropathy ([place])    [place]    Past Surgical History:  Procedure Laterality Date  . treadmill stress test  01/2013   WNL, low risk study    Current Medications: Current Meds  Medication Sig  . albuterol (PROAIR HFA) 108 (90 Base) MCG/ACT inhaler Inhale 2 puffs into the lungs every 4 (four) hours as needed for wheezing or shortness of breath.   Marland Kitchen atorvastatin (LIPITOR) 10 MG tablet Take 10 mg by mouth daily.  . Betamethasone Valerate 0.12 % foam Apply thin layer topically twice daily to areas of hair loss.  . carvedilol (COREG) 6.25 MG tablet Take 1  tablet (6.25 mg total) by mouth 2 (two) times daily.  . Continuous Blood Gluc Sensor (FREESTYLE LIBRE 2 SENSOR) MISC CHANGE EVERY 14 DAYS  . hydrOXYzine (ATARAX/VISTARIL) 25 MG tablet Take 1 tablet (25 mg total) by mouth 3 (three) times daily as needed.  . Insulin Disposable Pump (OMNIPOD 10 PACK) MISC by Does not apply route.  Marland Kitchen LORazepam (ATIVAN) 0.5 MG tablet TAKE 1 TABLET BY MOUTH EVERY 8 HOURS AS NEEDED FOR ANXIETY.  Marland Kitchen NOVOLOG 100 UNIT/ML injection SMARTSIG:0-120 Unit(s) SUB-Q Daily  . ondansetron (ZOFRAN ODT) 4 MG disintegrating tablet Take 1 tablet (4 mg total) by mouth every 8 (eight) hours as needed for nausea or vomiting.  . tobramycin (TOBREX) 0.3 % ophthalmic solution SMARTSIG:In Eye(s)  . torsemide (DEMADEX) 20 MG tablet TAKE 1 TABLET (20 MG TOTAL) BY MOUTH EVERY MONDAY, WEDNESDAY, AND FRIDAY.     Allergies:   Amoxicillin and Lantus [insulin glargine]   Social History   Socioeconomic History  . Marital status: Legally Separated    Spouse name: Not on file  . Number of children: 2  . Years of education: Not on file  . Highest education level: Not on file  Occupational History  . Occupation: Freight forwarder  Tobacco Use  . Smoking status: Never Smoker  . Smokeless tobacco: Never Used  Vaping Use  . Vaping Use:  Never used  Substance and Sexual Activity  . Alcohol use: Not Currently  . Drug use: No  . Sexual activity: Yes    Partners: Male    Birth control/protection: None  Other Topics Concern  . Not on file  Social History Narrative   Caffeine use: daily   Occupation: cleans houses   Regular exercise: yes, active 5x/wk   Diet: good water, fruits/vegetables daily   Social Determinants of Health   Financial Resource Strain:   . Difficulty of Paying Living Expenses: Not on file  Food Insecurity:   . Worried About Charity fundraiser in the Last Year: Not on file  . Ran Out of Food in the Last Year: Not on file  Transportation Needs:   . Lack of Transportation  (Medical): Not on file  . Lack of Transportation (Non-Medical): Not on file  Physical Activity:   . Days of Exercise per Week: Not on file  . Minutes of Exercise per Session: Not on file  Stress:   . Feeling of Stress : Not on file  Social Connections:   . Frequency of Communication with Friends and Family: Not on file  . Frequency of Social Gatherings with Friends and Family: Not on file  . Attends Religious Services: Not on file  . Active Member of Clubs or Organizations: Not on file  . Attends Archivist Meetings: Not on file  . Marital Status: Not on file     Family History: The patient's family history includes Bipolar disorder in her mother; CAD in her maternal aunt; Calcium disorder in her mother; Cancer in her maternal grandmother, maternal uncle, and paternal grandmother; Cancer (age of onset: 9) in her maternal aunt; Diabetes in her mother, paternal grandfather, paternal grandmother, and sister; Heart disease in her mother; Heart failure in her sister; Hypertension in her mother; Stroke in her maternal aunt; Thyroid disease in her mother.  ROS:   Please see the history of present illness.    The chest pain is noted.  She has numbness in her feet.  She will not wear support stockings.  She was recently in the hospital because of nausea, vomiting, and diarrhea.  She was told she was dehydrated.  She was receiving IV iron but had a reaction and can no longer receive that.  Her thyroid which is visible by inspection, is being followed by Dr. Buddy Duty as is diabetes.  All other systems reviewed and are negative.  EKGs/Labs/Other Studies Reviewed:    The following studies were reviewed today: Creatinine 0.81 in November. Hemoglobin A1c 8.06 April 2020 LDL cholesterol 119.  Needs better control.  HDL counterbalance this and is 73.  EKG:  EKG not repeated  Recent Labs: 01/31/2020: Magnesium 2.1; NT-Pro BNP 1,696 03/27/2020: ALT 10; TSH 2.00 06/12/2020: BUN 21; Creatinine,  Ser 0.81; Hemoglobin 11.0; Platelets 238; Potassium 4.1; Sodium 141  Recent Lipid Panel    Component Value Date/Time   CHOL 205 (H) 01/31/2020 1008   TRIG 70 01/31/2020 1008   HDL 73 01/31/2020 1008   CHOLHDL 2.9 12/03/2019 0952   CHOLHDL 5.2 10/12/2019 0834   VLDL 16 10/12/2019 0834   LDLCALC 119 (H) 01/31/2020 1008   LDLDIRECT 104.1 04/12/2014 1608    Physical Exam:    VS:  BP 130/90   Pulse 95   Ht 6' (1.829 m)   Wt 186 lb 6.4 oz (84.6 kg)   SpO2 99%   BMI 25.28 kg/m     Wt Readings from  Last 3 Encounters:  06/26/20 186 lb 6.4 oz (84.6 kg)  06/16/20 183 lb 3.2 oz (83.1 kg)  05/15/20 179 lb (81.2 kg)     GEN: Appears older than stated age. No acute distress HEENT: Normal NECK: No JVD. LYMPHATICS: No lymphadenopathy CARDIAC:  RRR without murmur, gallop, but with some lichenification and edema both lower extremities. VASCULAR:  Normal Pulses. No bruits. RESPIRATORY:  Clear to auscultation without rales, wheezing or rhonchi  ABDOMEN: Soft, non-tender, non-distended, No pulsatile mass, MUSCULOSKELETAL: No deformity  SKIN: Warm and dry NEUROLOGIC:  Alert and oriented x 3 PSYCHIATRIC:  Normal affect   ASSESSMENT:    1. Chronic diastolic heart failure ([place])   2. Dyspnea on exertion   3. Orthostatic hypotension   4. Type 1 diabetes mellitus with complications (HCC)   5. Mixed hyperlipidemia   6. Educated about COVID-19 virus infection    PLAN:    In order of problems listed above:  1. She is compensated with torsemide Monday, Wednesday, and Friday.  We discussed adjusting the dose dependent upon edema and dyspnea.  She has been very stable on the current dose.  If we take too much fluid off, orthostasis becomes a big issue and causes fainting. 2. Related to #1.  Currently stable but we did discuss adjusting diuretics as needed. 3. Last hemoglobin A1c was 8.1.   4. LDL could be better managed, but had side effects on high intensity statin therapy.  Consider  alternative approach.  We will discuss at the 67-month visit. 5. We will need to further increase statin therapy or add Zetia in the future to get better control 6. Covid vaccinated and practicing social distancing.   In 68-month follow-up.   Medication Adjustments/Labs and Tests Ordered: Current medicines are reviewed at length with the patient today.  Concerns regarding medicines are outlined above.  No orders of the defined types were placed in this encounter.  No orders of the defined types were placed in this encounter.   Patient Instructions  Medication Instructions:  Your physician recommends that you continue on your current medications as directed. Please refer to the Current Medication list given to you today.  *If you need a refill on your cardiac medications before your next appointment, please call your pharmacy*   Lab Work: None If you have labs (blood work) drawn today and your tests are completely normal, you will receive your results only by: Marland Kitchen MyChart Message (if you have MyChart) OR . A paper copy in the mail If you have any lab test that is abnormal or we need to change your treatment, we will call you to review the results.   Testing/Procedures: None   Follow-Up: At [facility], you and your health needs are our priority.  As part of our continuing mission to provide you with exceptional heart care, we have created designated Provider Care Teams.  These Care Teams include your primary Cardiologist (physician) and Advanced Practice Providers (APPs -  Physician Assistants and Nurse Practitioners) who all work together to provide you with the care you need, when you need it.  We recommend signing up for the patient portal called "MyChart".  Sign up information is provided on this After Visit Summary.  MyChart is used to connect with patients for Virtual Visits (Telemedicine).  Patients are able to view lab/test results, encounter notes, upcoming appointments,  etc.  Non-urgent messages can be sent to your provider as well.   To learn more about what you can do  with MyChart, go to [email or web address].    Your next appointment:   6 month(s)  The format for your next appointment:   In Person  Provider:   You may see Sinclair Grooms, MD or one of the following Advanced Practice Providers on your designated Care Team:    Truitt Merle, NP  Cecilie Kicks, NP  Kathyrn Drown, NP    Other Instructions      Signed, Sinclair Grooms, MD  06/26/2020 10:54 AM    [place]

## 2020-06-28 ENCOUNTER — Ambulatory Visit
Admission: RE | Admit: 2020-06-28 | Discharge: 2020-06-28 | Disposition: A | Discharge status: Home / Self Care | Payer: BC Managed Care – PPO | Source: Ambulatory Visit | Attending: Internal Medicine | Admitting: Internal Medicine

## 2020-06-28 DIAGNOSIS — J90 Pleural effusion, not elsewhere classified: Secondary | ICD-10-CM | POA: Diagnosis not present

## 2020-06-28 DIAGNOSIS — R809 Proteinuria, unspecified: Secondary | ICD-10-CM | POA: Diagnosis not present

## 2020-07-03 ENCOUNTER — Encounter: Payer: Self-pay | Admitting: Physician Assistant

## 2020-07-03 ENCOUNTER — Other Ambulatory Visit: Payer: BC Managed Care – PPO

## 2020-07-03 ENCOUNTER — Telehealth (INDEPENDENT_AMBULATORY_CARE_PROVIDER_SITE_OTHER): Payer: BC Managed Care – PPO | Admitting: Physician Assistant

## 2020-07-03 ENCOUNTER — Other Ambulatory Visit: Payer: Self-pay

## 2020-07-03 DIAGNOSIS — Z20822 Contact with and (suspected) exposure to covid-19: Secondary | ICD-10-CM

## 2020-07-03 DIAGNOSIS — K5900 Constipation, unspecified: Secondary | ICD-10-CM

## 2020-07-03 MED ORDER — BENZONATATE 100 MG PO CAPS: 100.0000 mg | ORAL_CAPSULE | Freq: Three times a day (TID) | ORAL | 0 refills | Status: DC | PRN

## 2020-07-03 MED ORDER — ALBUTEROL SULFATE HFA 108 (90 BASE) MCG/ACT IN AERS: 2.0000 | INHALATION_SPRAY | Freq: Four times a day (QID) | RESPIRATORY_TRACT | 1 refills | Status: AC | PRN

## 2020-07-03 NOTE — Progress Notes (Signed)
I have discussed the procedure for the virtual visit with the patient who has given consent to proceed with assessment and treatment.   [name] S Cuahutemoc Attar, CMA     

## 2020-07-03 NOTE — Progress Notes (Signed)
Virtual Visit via Video   I connected with patient on 07/03/20 at 11:30 AM EST by a video enabled telemedicine application and verified that I am speaking with the correct person using two identifiers.  Location patient: Home Location provider: [place] [facility], Office Persons participating in the virtual visit: Patient, Provider, CMA (Patina Moore)  I discussed the limitations of evaluation and management by telemedicine and the availability of in person appointments. The patient expressed understanding and agreed to proceed.  Subjective:   HPI:   Patient presents via Caregility today 1.5 days of cough, wheezing, shortness of breath with fatigue. Denies fever, chills or aches. Notes chest wall tenderness from the coughing.  Denies recent travel or sick contact. Does work with the public. Has had all three COVID vaccines.   Patient also notes constipation over the past week with last bowel movement 3-1/2 days ago.  Denies any melena, hematochezia or tenesmus.  Notes keeping well-hydrated.  Has been taking Metamucil without significant improvement.  Denies any nausea or vomiting..   ROS:   See pertinent positives and negatives per HPI.  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, limited to breakdown of skin (HCC) 07/30/2016   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HCC) 07/30/2016   Multiple thyroid nodules 07/30/2016   Complicated grieving  05/06/2014   Ulcers of both lower legs (HCC) 02/20/2014   Health maintenance examination 09/20/2013   Diabetic ulcer of left foot (HCC) 05/12/2013   Diabetic foot ulcers (HCC) 05/01/2013   Skin rash 01/05/2013   Contraception management 12/31/2012   Mood disorder (HCC) 12/30/2012   Unspecified vitamin D deficiency 12/03/2012   H/O thyroid nodule 12/03/2012   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) 06/18/2007    Social History   Tobacco Use   Smoking status: Never Smoker   Smokeless tobacco: Never Used  Substance Use Topics   Alcohol use: Not Currently    Current Outpatient Medications:    albuterol (PROAIR HFA) 108 (90 Base) MCG/ACT inhaler, Inhale 2 puffs into the lungs every 4 (four) hours as needed for wheezing or shortness of breath. , Disp: , Rfl:    atorvastatin (LIPITOR) 10 MG tablet, Take 10 mg by mouth daily., Disp: , Rfl:    Betamethasone Valerate 0.12 % foam, Apply thin layer topically twice daily to areas of hair loss., Disp: 100 g, Rfl: 0   carvedilol (COREG) 6.25 MG tablet, Take 1 tablet (6.25 mg total) by mouth 2 (two) times daily., Disp: 180 tablet, Rfl: 3   Continuous Blood Gluc Sensor (FREESTYLE LIBRE 2 SENSOR) MISC, CHANGE EVERY 14 DAYS, Disp: , Rfl:    hydrOXYzine (ATARAX/VISTARIL) 25 MG tablet, Take 1 tablet (25 mg total) by mouth 3 (three) times daily as needed., Disp: 60 tablet, Rfl: 2   Insulin Disposable Pump (OMNIPOD 10 PACK) MISC, by Does not apply route., Disp: , Rfl:    LORazepam (ATIVAN) 0.5 MG tablet, TAKE 1 TABLET BY MOUTH EVERY 8 HOURS AS NEEDED FOR ANXIETY., Disp: 20 tablet,  Rfl: 0   NOVOLOG 100 UNIT/ML injection, SMARTSIG:0-120 Unit(s) SUB-Q Daily, Disp: , Rfl:    ondansetron (ZOFRAN ODT) 4 MG disintegrating tablet, Take 1 tablet (4 mg total) by mouth every 8 (eight) hours as needed for nausea or vomiting., Disp: 20 tablet, Rfl: 0   tobramycin (TOBREX) 0.3 % ophthalmic solution, SMARTSIG:In Eye(s), Disp: , Rfl:     torsemide (DEMADEX) 20 MG tablet, TAKE 1 TABLET (20 MG TOTAL) BY MOUTH EVERY MONDAY, WEDNESDAY, AND FRIDAY., Disp: 135 tablet, Rfl: 1  Allergies  Allergen Reactions   Amoxicillin Itching   Lantus [Insulin Glargine] Itching and Rash    Objective:   There were no vitals taken for this visit.  Patient is well-developed, well-nourished in no acute distress.  Resting comfortably at home.  Head is normocephalic, atraumatic.  No labored breathing.  Speech is clear and coherent with logical content.  Patient is alert and oriented at baseline.   Assessment and Plan:   1. Suspected COVID-19 virus infection Patient sent for testing.  She is to quarantine until results are in.  Vitamin regimen reviewed.  Increase fluids.  Start plain Mucinex.  Rx Tessalon and albuterol.  Patient enrolled in Covid MyChart symptom monitoring program.  Strict ER precautions discussed with patient.  2. Constipation, unspecified constipation type Bowel regimen reviewed with patient.  Handout sent to MyChart.  She is to follow-up if symptoms not improving.    [name], PA-C 07/03/2020

## 2020-07-03 NOTE — Patient Instructions (Signed)
Instructions sent to MyChart

## 2020-07-04 ENCOUNTER — Telehealth: Payer: Self-pay | Admitting: Physician Assistant

## 2020-07-04 DIAGNOSIS — E1140 Type 2 diabetes mellitus with diabetic neuropathy, unspecified: Secondary | ICD-10-CM | POA: Diagnosis not present

## 2020-07-04 DIAGNOSIS — Z794 Long term (current) use of insulin: Secondary | ICD-10-CM | POA: Diagnosis not present

## 2020-07-04 DIAGNOSIS — E041 Nontoxic single thyroid nodule: Secondary | ICD-10-CM | POA: Diagnosis not present

## 2020-07-04 DIAGNOSIS — E049 Nontoxic goiter, unspecified: Secondary | ICD-10-CM | POA: Diagnosis not present

## 2020-07-04 NOTE — Telephone Encounter (Signed)
Patient went and got tested for Covid per Cody's suggestion.  Test came back negative and she would like to know if Einar Pheasant is going to call her in an antibiotic for her other symptoms.  Please advise

## 2020-07-04 NOTE — Telephone Encounter (Signed)
Would continue care discussed at yesterday's visit since only 1.5 days of symptoms. If not easing up int he next 48-72 hours we can start antibiotic before the weekend.

## 2020-07-04 NOTE — Telephone Encounter (Signed)
Patient seen 07/03/20 and started on albuterol, mucinex, and tessalon pearls. Please advise if antibiotic is warranted. Negative for covid.

## 2020-07-05 DIAGNOSIS — R9341 Abnormal radiologic findings on diagnostic imaging of renal pelvis, ureter, or bladder: Secondary | ICD-10-CM | POA: Diagnosis not present

## 2020-07-07 ENCOUNTER — Other Ambulatory Visit: Payer: Self-pay

## 2020-07-07 MED ORDER — DOXYCYCLINE HYCLATE 100 MG PO TABS: 100.0000 mg | ORAL_TABLET | Freq: Two times a day (BID) | ORAL | 0 refills | Status: DC

## 2020-07-07 NOTE — Telephone Encounter (Signed)
Please advise on what antibiotic to call in

## 2020-07-07 NOTE — Telephone Encounter (Signed)
Patient called back and said that symptoms are not improving and she would like an antibiotic called in.  Please Advise.

## 2020-07-07 NOTE — Telephone Encounter (Signed)
Ok for Doxycycline $RemoveBeforeD'100mg'[id number]$  BID x7 days, #14.  Take w/ food.

## 2020-07-07 NOTE — Telephone Encounter (Signed)
Patient aware that antibiotic has been sent in

## 2020-07-12 DIAGNOSIS — E113511 Type 2 diabetes mellitus with proliferative diabetic retinopathy with macular edema, right eye: Secondary | ICD-10-CM | POA: Diagnosis not present

## 2020-07-12 DIAGNOSIS — H3582 Retinal ischemia: Secondary | ICD-10-CM | POA: Diagnosis not present

## 2020-07-12 DIAGNOSIS — E113592 Type 2 diabetes mellitus with proliferative diabetic retinopathy without macular edema, left eye: Secondary | ICD-10-CM | POA: Diagnosis not present

## 2020-07-12 DIAGNOSIS — H43822 Vitreomacular adhesion, left eye: Secondary | ICD-10-CM | POA: Diagnosis not present

## 2020-08-07 ENCOUNTER — Encounter: Payer: Self-pay | Admitting: Physician Assistant

## 2020-08-07 ENCOUNTER — Encounter (HOSPITAL_BASED_OUTPATIENT_CLINIC_OR_DEPARTMENT_OTHER): Payer: Self-pay

## 2020-08-07 ENCOUNTER — Other Ambulatory Visit: Payer: Self-pay

## 2020-08-07 ENCOUNTER — Emergency Department (HOSPITAL_BASED_OUTPATIENT_CLINIC_OR_DEPARTMENT_OTHER)
Admission: EM | Admit: 2020-08-07 | Discharge: 2020-08-07 | Disposition: A | Discharge status: Home / Self Care | Payer: BC Managed Care – PPO | Source: Home / Self Care

## 2020-08-07 ENCOUNTER — Telehealth (INDEPENDENT_AMBULATORY_CARE_PROVIDER_SITE_OTHER): Payer: BC Managed Care – PPO | Admitting: Physician Assistant

## 2020-08-07 DIAGNOSIS — Z20822 Contact with and (suspected) exposure to covid-19: Secondary | ICD-10-CM | POA: Diagnosis not present

## 2020-08-07 DIAGNOSIS — E860 Dehydration: Secondary | ICD-10-CM | POA: Diagnosis not present

## 2020-08-07 DIAGNOSIS — R000 Tachycardia, unspecified: Secondary | ICD-10-CM | POA: Diagnosis not present

## 2020-08-07 DIAGNOSIS — Z5321 Procedure and treatment not carried out due to patient leaving prior to being seen by health care provider: Secondary | ICD-10-CM | POA: Insufficient documentation

## 2020-08-07 DIAGNOSIS — R110 Nausea: Secondary | ICD-10-CM | POA: Diagnosis not present

## 2020-08-07 DIAGNOSIS — R112 Nausea with vomiting, unspecified: Secondary | ICD-10-CM | POA: Diagnosis not present

## 2020-08-07 DIAGNOSIS — R1111 Vomiting without nausea: Secondary | ICD-10-CM | POA: Diagnosis not present

## 2020-08-07 DIAGNOSIS — E785 Hyperlipidemia, unspecified: Secondary | ICD-10-CM | POA: Diagnosis not present

## 2020-08-07 DIAGNOSIS — R509 Fever, unspecified: Secondary | ICD-10-CM | POA: Diagnosis not present

## 2020-08-07 DIAGNOSIS — E1165 Type 2 diabetes mellitus with hyperglycemia: Secondary | ICD-10-CM | POA: Diagnosis not present

## 2020-08-07 DIAGNOSIS — J90 Pleural effusion, not elsewhere classified: Secondary | ICD-10-CM | POA: Diagnosis not present

## 2020-08-07 DIAGNOSIS — K2950 Unspecified chronic gastritis without bleeding: Secondary | ICD-10-CM | POA: Diagnosis not present

## 2020-08-07 DIAGNOSIS — Z833 Family history of diabetes mellitus: Secondary | ICD-10-CM | POA: Diagnosis not present

## 2020-08-07 DIAGNOSIS — I110 Hypertensive heart disease with heart failure: Secondary | ICD-10-CM | POA: Diagnosis not present

## 2020-08-07 DIAGNOSIS — E1040 Type 1 diabetes mellitus with diabetic neuropathy, unspecified: Secondary | ICD-10-CM | POA: Diagnosis not present

## 2020-08-07 DIAGNOSIS — Z9641 Presence of insulin pump (external) (internal): Secondary | ICD-10-CM | POA: Diagnosis present

## 2020-08-07 DIAGNOSIS — R933 Abnormal findings on diagnostic imaging of other parts of digestive tract: Secondary | ICD-10-CM | POA: Diagnosis not present

## 2020-08-07 DIAGNOSIS — R944 Abnormal results of kidney function studies: Secondary | ICD-10-CM | POA: Diagnosis present

## 2020-08-07 DIAGNOSIS — Z8249 Family history of ischemic heart disease and other diseases of the circulatory system: Secondary | ICD-10-CM | POA: Diagnosis not present

## 2020-08-07 DIAGNOSIS — R109 Unspecified abdominal pain: Secondary | ICD-10-CM | POA: Insufficient documentation

## 2020-08-07 DIAGNOSIS — J189 Pneumonia, unspecified organism: Secondary | ICD-10-CM | POA: Diagnosis not present

## 2020-08-07 DIAGNOSIS — E1140 Type 2 diabetes mellitus with diabetic neuropathy, unspecified: Secondary | ICD-10-CM | POA: Diagnosis not present

## 2020-08-07 DIAGNOSIS — J929 Pleural plaque without asbestos: Secondary | ICD-10-CM | POA: Diagnosis not present

## 2020-08-07 DIAGNOSIS — J45909 Unspecified asthma, uncomplicated: Secondary | ICD-10-CM | POA: Diagnosis present

## 2020-08-07 DIAGNOSIS — K2100 Gastro-esophageal reflux disease with esophagitis, without bleeding: Secondary | ICD-10-CM | POA: Diagnosis not present

## 2020-08-07 DIAGNOSIS — R188 Other ascites: Secondary | ICD-10-CM | POA: Diagnosis not present

## 2020-08-07 DIAGNOSIS — K319 Disease of stomach and duodenum, unspecified: Secondary | ICD-10-CM | POA: Diagnosis not present

## 2020-08-07 DIAGNOSIS — E1065 Type 1 diabetes mellitus with hyperglycemia: Secondary | ICD-10-CM | POA: Diagnosis not present

## 2020-08-07 DIAGNOSIS — J984 Other disorders of lung: Secondary | ICD-10-CM | POA: Diagnosis not present

## 2020-08-07 DIAGNOSIS — R1084 Generalized abdominal pain: Secondary | ICD-10-CM | POA: Diagnosis not present

## 2020-08-07 DIAGNOSIS — R1115 Cyclical vomiting syndrome unrelated to migraine: Secondary | ICD-10-CM | POA: Diagnosis not present

## 2020-08-07 DIAGNOSIS — R079 Chest pain, unspecified: Secondary | ICD-10-CM | POA: Diagnosis not present

## 2020-08-07 DIAGNOSIS — F419 Anxiety disorder, unspecified: Secondary | ICD-10-CM | POA: Diagnosis present

## 2020-08-07 DIAGNOSIS — F39 Unspecified mood [affective] disorder: Secondary | ICD-10-CM | POA: Diagnosis not present

## 2020-08-07 DIAGNOSIS — Z823 Family history of stroke: Secondary | ICD-10-CM | POA: Diagnosis not present

## 2020-08-07 DIAGNOSIS — K317 Polyp of stomach and duodenum: Secondary | ICD-10-CM | POA: Diagnosis not present

## 2020-08-07 DIAGNOSIS — K3189 Other diseases of stomach and duodenum: Secondary | ICD-10-CM | POA: Diagnosis not present

## 2020-08-07 DIAGNOSIS — I10 Essential (primary) hypertension: Secondary | ICD-10-CM | POA: Diagnosis not present

## 2020-08-07 DIAGNOSIS — E876 Hypokalemia: Secondary | ICD-10-CM | POA: Diagnosis not present

## 2020-08-07 DIAGNOSIS — D649 Anemia, unspecified: Secondary | ICD-10-CM | POA: Diagnosis not present

## 2020-08-07 DIAGNOSIS — I5032 Chronic diastolic (congestive) heart failure: Secondary | ICD-10-CM | POA: Diagnosis not present

## 2020-08-07 LAB — COMPREHENSIVE METABOLIC PANEL
ALT: 18 U/L (ref 0–44)
AST: 22 U/L (ref 15–41)
Albumin: 2.6 g/dL — ABNORMAL LOW (ref 3.5–5.0)
Alkaline Phosphatase: 71 U/L (ref 38–126)
Anion gap: 7 (ref 5–15)
BUN: 16 mg/dL (ref 6–20)
CO2: 27 mmol/L (ref 22–32)
Calcium: 8.4 mg/dL — ABNORMAL LOW (ref 8.9–10.3)
Chloride: 100 mmol/L (ref 98–111)
Creatinine, Ser: 1.03 mg/dL — ABNORMAL HIGH (ref 0.44–1.00)
GFR, Estimated: >60 mL/min (ref >60)
Glucose, Bld: 349 mg/dL — ABNORMAL HIGH (ref 70–99)
Potassium: 3.9 mmol/L (ref 3.5–5.1)
Sodium: 134 mmol/L — ABNORMAL LOW (ref 135–145)
Total Bilirubin: 1.1 mg/dL (ref 0.3–1.2)
Total Protein: 6.4 g/dL — ABNORMAL LOW (ref 6.5–8.1)

## 2020-08-07 LAB — LIPASE, BLOOD: Lipase: 18 U/L (ref 11–51)

## 2020-08-07 LAB — CBC
HCT: 34.6 % — ABNORMAL LOW (ref 36.0–46.0)
Hemoglobin: 11.7 g/dL — ABNORMAL LOW (ref 12.0–15.0)
MCH: 29.9 pg (ref 26.0–34.0)
MCHC: 33.8 g/dL (ref 30.0–36.0)
MCV: 88.5 fL (ref 80.0–100.0)
Platelets: 274 10*3/uL (ref 150–400)
RBC: 3.91 MIL/uL (ref 3.87–5.11)
RDW: 12.4 % (ref 11.5–15.5)
WBC: 5.2 10*3/uL (ref 4.0–10.5)
nRBC: 0 % (ref 0.0–0.2)

## 2020-08-07 LAB — URINALYSIS, ROUTINE W REFLEX MICROSCOPIC
Bilirubin Urine: NEGATIVE
Glucose, UA: >=500 mg/dL — AB
Ketones, ur: 40 mg/dL — AB
Leukocytes,Ua: NEGATIVE
Nitrite: NEGATIVE
Protein, ur: >300 mg/dL — AB
Specific Gravity, Urine: 1.015 (ref 1.005–1.030)
pH: 7 (ref 5.0–8.0)

## 2020-08-07 LAB — CBG MONITORING, ED: Glucose-Capillary: 304 mg/dL — ABNORMAL HIGH (ref 70–99)

## 2020-08-07 LAB — URINALYSIS, MICROSCOPIC (REFLEX): RBC / HPF: >50 RBC/hpf (ref 0–5)

## 2020-08-07 LAB — PREGNANCY, URINE: Preg Test, Ur: NEGATIVE

## 2020-08-07 NOTE — ED Triage Notes (Signed)
Pt c/o abd pain, n/v started 1/1-NAD-steady gait

## 2020-08-07 NOTE — Progress Notes (Signed)
I have discussed the procedure for the virtual visit with the patient who has given consent to proceed with assessment and treatment.   Slaton Reaser S [name] Killilea, CMA     

## 2020-08-07 NOTE — Progress Notes (Signed)
Virtual Visit via Video   I connected with patient on 08/07/20 at  3:00 PM EST by a video enabled telemedicine application and verified that I am speaking with the correct person using two identifiers.  Location patient: Home Location provider: [place] [facility], Office Persons participating in the virtual visit: Patient, Provider, CMA (Patina Moore)  I discussed the limitations of evaluation and management by telemedicine and the availability of in person appointments. The patient expressed understanding and agreed to proceed.  Subjective:   HPI:  Patient presents via Caregility today c/o 2 days of episodes of non-bloody emesis associated with some weakness.  Thinks her episodes are occurring about every 30 minutes.  Notes some upper abdominal discomfort secondary to retching.  Denies any fever, chills or aches.  Denies recent travel or known sick contact.  Denies any diarrhea.  Notes she actually has not had a bowel movement in the past 3 days.  States she is unable to pass gas and has not noticed any growling of her stomach.  Is trying to take her old prescription for dissolvable Zofran but states she throws up before the tablet can fully dissolve.  Is drinking fluids and trying to do what she can to remain hydrated despite continual vomiting.  ROS:   See pertinent positives and negatives per HPI.  Patient Active Problem List   Diagnosis Date Noted  . Bone pain 06/16/2020  . Deficiency anemia 04/21/2020  . Preventive measure 04/21/2020  . Heart failure with normal ejection fraction (HCC) 12/14/2019  . Abdominal pain, epigastric 12/02/2019  . Constipation 12/02/2019  . Loss of weight 12/02/2019  . Leg edema 11/21/2019  . Uncontrolled type 1 diabetes mellitus with hyperglycemia, with long-term current use of insulin (HCC) 10/11/2019  . Dysfunctional uterine bleeding 10/11/2019  . Mild renal insufficiency 10/11/2019  . 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HCC) 07/30/2016  . Multiple thyroid nodules 07/30/2016  . Complicated grieving 05/06/2014  . Ulcers of both lower legs (HCC) 02/20/2014  . Health maintenance examination 09/20/2013  . Diabetic ulcer of left foot (HCC) 05/12/2013  . Diabetic foot ulcers (HCC) 05/01/2013  . Skin rash 01/05/2013  . Contraception management 12/31/2012  . Mood disorder (HCC) 12/30/2012  . Unspecified vitamin D deficiency 12/03/2012  . H/O thyroid nodule 12/03/2012  . 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) 06/18/2007    Social History   Tobacco Use  . Smoking status: Never Smoker  . Smokeless tobacco: Never Used  Substance Use Topics  . Alcohol use: Not Currently    Current Outpatient Medications:  .  albuterol (PROAIR HFA) 108 (90 Base) MCG/ACT inhaler, Inhale 2 puffs into the lungs every 6 (six) hours as needed for wheezing or shortness of breath., Disp: 6.7 g, Rfl: 1 .  atorvastatin (LIPITOR) 10 MG tablet, Take 10 mg by mouth daily., Disp: , Rfl:  .  Betamethasone Valerate 0.12 % foam, Apply thin layer topically twice daily to areas of hair loss., Disp: 100 g, Rfl: 0 .  carvedilol (COREG) 6.25 MG tablet, Take 1 tablet (6.25 mg total) by mouth 2 (two) times daily., Disp: 180 tablet, Rfl: 3 .  Continuous Blood Gluc Sensor (FREESTYLE LIBRE 2 SENSOR) MISC, CHANGE EVERY 14 DAYS, Disp: , Rfl:  .  hydrOXYzine (ATARAX/VISTARIL) 25 MG tablet, Take 1 tablet (25 mg total) by mouth 3 (three) times daily as needed., Disp: 60 tablet, Rfl: 2 .  Insulin Disposable Pump (OMNIPOD 10 PACK) MISC, by Does not apply route., Disp: , Rfl:  .  LORazepam (ATIVAN) 0.5 MG tablet, TAKE 1 TABLET BY MOUTH EVERY 8 HOURS AS NEEDED FOR ANXIETY., Disp: 20 tablet, Rfl: 0 .  NOVOLOG 100 UNIT/ML injection, SMARTSIG:0-120 Unit(s) SUB-Q Daily, Disp: , Rfl:  .  ondansetron (ZOFRAN ODT) 4 MG disintegrating tablet, Take 1 tablet (4 mg total) by mouth  every 8 (eight) hours as needed for nausea or vomiting., Disp: 20 tablet, Rfl: 0 .  tobramycin (TOBREX) 0.3 % ophthalmic solution, SMARTSIG:In Eye(s), Disp: , Rfl:  .  torsemide (DEMADEX) 20 MG tablet, TAKE 1 TABLET (20 MG TOTAL) BY MOUTH EVERY MONDAY, WEDNESDAY, AND FRIDAY., Disp: 135 tablet, Rfl: 1  Allergies  Allergen Reactions  . Amoxicillin Itching  . Lantus [Insulin Glargine] Itching and Rash    Objective:   There were no vitals taken for this visit.  Patient is well-developed, well-nourished in no acute distress.  Resting comfortably at home.  Head is normocephalic, atraumatic.  No labored breathing.  Speech is clear and coherent with logical content.  Patient is alert and oriented at baseline.   Assessment and Plan:   1. Intractable vomiting with nausea, unspecified vomiting type Unable to keep sublingual medicine in her system.  Giving continual vomiting and absence of bowel movement or flatus this does raise concern for bowel obstruction.  Also significant concern for dehydration and renal insult secondary to dehydration.  Patient triaged to [place] [facility] near her work for IV fluids, further evaluation and management.  Patient voiced understanding and agreement with plan.  Is being taken there now.    [name], PA-C 08/07/2020

## 2020-08-08 ENCOUNTER — Inpatient Hospital Stay (HOSPITAL_COMMUNITY)
Admission: EM | Admit: 2020-08-08 | Discharge: 2020-08-16 | DRG: 394 | Disposition: A | Discharge status: Home / Self Care | Payer: BC Managed Care – PPO | Attending: Family Medicine | Admitting: Family Medicine

## 2020-08-08 ENCOUNTER — Encounter (HOSPITAL_COMMUNITY): Payer: Self-pay | Admitting: Emergency Medicine

## 2020-08-08 ENCOUNTER — Emergency Department (HOSPITAL_COMMUNITY): Payer: BC Managed Care – PPO

## 2020-08-08 ENCOUNTER — Telehealth: Payer: Self-pay | Admitting: Physician Assistant

## 2020-08-08 ENCOUNTER — Other Ambulatory Visit: Payer: Self-pay

## 2020-08-08 DIAGNOSIS — Z833 Family history of diabetes mellitus: Secondary | ICD-10-CM

## 2020-08-08 DIAGNOSIS — E11621 Type 2 diabetes mellitus with foot ulcer: Secondary | ICD-10-CM | POA: Diagnosis present

## 2020-08-08 DIAGNOSIS — E785 Hyperlipidemia, unspecified: Secondary | ICD-10-CM | POA: Diagnosis present

## 2020-08-08 DIAGNOSIS — K317 Polyp of stomach and duodenum: Secondary | ICD-10-CM

## 2020-08-08 DIAGNOSIS — K2100 Gastro-esophageal reflux disease with esophagitis, without bleeding: Secondary | ICD-10-CM | POA: Diagnosis present

## 2020-08-08 DIAGNOSIS — I5032 Chronic diastolic (congestive) heart failure: Secondary | ICD-10-CM | POA: Diagnosis present

## 2020-08-08 DIAGNOSIS — R112 Nausea with vomiting, unspecified: Secondary | ICD-10-CM | POA: Diagnosis not present

## 2020-08-08 DIAGNOSIS — Z20822 Contact with and (suspected) exposure to covid-19: Secondary | ICD-10-CM | POA: Diagnosis present

## 2020-08-08 DIAGNOSIS — R079 Chest pain, unspecified: Secondary | ICD-10-CM

## 2020-08-08 DIAGNOSIS — Z823 Family history of stroke: Secondary | ICD-10-CM

## 2020-08-08 DIAGNOSIS — J45909 Unspecified asthma, uncomplicated: Secondary | ICD-10-CM | POA: Diagnosis present

## 2020-08-08 DIAGNOSIS — R944 Abnormal results of kidney function studies: Secondary | ICD-10-CM | POA: Diagnosis present

## 2020-08-08 DIAGNOSIS — E1140 Type 2 diabetes mellitus with diabetic neuropathy, unspecified: Secondary | ICD-10-CM | POA: Diagnosis present

## 2020-08-08 DIAGNOSIS — E860 Dehydration: Secondary | ICD-10-CM | POA: Diagnosis present

## 2020-08-08 DIAGNOSIS — R1084 Generalized abdominal pain: Secondary | ICD-10-CM

## 2020-08-08 DIAGNOSIS — E1069 Type 1 diabetes mellitus with other specified complication: Secondary | ICD-10-CM | POA: Diagnosis present

## 2020-08-08 DIAGNOSIS — E876 Hypokalemia: Secondary | ICD-10-CM | POA: Diagnosis not present

## 2020-08-08 DIAGNOSIS — Z9641 Presence of insulin pump (external) (internal): Secondary | ICD-10-CM | POA: Diagnosis present

## 2020-08-08 DIAGNOSIS — K319 Disease of stomach and duodenum, unspecified: Secondary | ICD-10-CM | POA: Diagnosis present

## 2020-08-08 DIAGNOSIS — K2950 Unspecified chronic gastritis without bleeding: Secondary | ICD-10-CM | POA: Diagnosis present

## 2020-08-08 DIAGNOSIS — Z8249 Family history of ischemic heart disease and other diseases of the circulatory system: Secondary | ICD-10-CM

## 2020-08-08 DIAGNOSIS — E1065 Type 1 diabetes mellitus with hyperglycemia: Secondary | ICD-10-CM | POA: Diagnosis present

## 2020-08-08 DIAGNOSIS — R1115 Cyclical vomiting syndrome unrelated to migraine: Principal | ICD-10-CM | POA: Diagnosis present

## 2020-08-08 DIAGNOSIS — D649 Anemia, unspecified: Secondary | ICD-10-CM | POA: Diagnosis present

## 2020-08-08 DIAGNOSIS — Z8349 Family history of other endocrine, nutritional and metabolic diseases: Secondary | ICD-10-CM

## 2020-08-08 DIAGNOSIS — R509 Fever, unspecified: Secondary | ICD-10-CM | POA: Diagnosis not present

## 2020-08-08 DIAGNOSIS — F419 Anxiety disorder, unspecified: Secondary | ICD-10-CM | POA: Diagnosis present

## 2020-08-08 DIAGNOSIS — F39 Unspecified mood [affective] disorder: Secondary | ICD-10-CM | POA: Diagnosis present

## 2020-08-08 DIAGNOSIS — E1040 Type 1 diabetes mellitus with diabetic neuropathy, unspecified: Secondary | ICD-10-CM | POA: Diagnosis present

## 2020-08-08 DIAGNOSIS — I110 Hypertensive heart disease with heart failure: Secondary | ICD-10-CM | POA: Diagnosis present

## 2020-08-08 LAB — COMPREHENSIVE METABOLIC PANEL
ALT: 19 U/L (ref 0–44)
AST: 17 U/L (ref 15–41)
Albumin: 2.5 g/dL — ABNORMAL LOW (ref 3.5–5.0)
Alkaline Phosphatase: 65 U/L (ref 38–126)
Anion gap: 11 (ref 5–15)
BUN: 23 mg/dL — ABNORMAL HIGH (ref 6–20)
CO2: 26 mmol/L (ref 22–32)
Calcium: 8.7 mg/dL — ABNORMAL LOW (ref 8.9–10.3)
Chloride: 103 mmol/L (ref 98–111)
Creatinine, Ser: 0.93 mg/dL (ref 0.44–1.00)
GFR, Estimated: >60 mL/min (ref >60)
Glucose, Bld: 320 mg/dL — ABNORMAL HIGH (ref 70–99)
Potassium: 3.9 mmol/L (ref 3.5–5.1)
Sodium: 140 mmol/L (ref 135–145)
Total Bilirubin: 1.1 mg/dL (ref 0.3–1.2)
Total Protein: 6.1 g/dL — ABNORMAL LOW (ref 6.5–8.1)

## 2020-08-08 LAB — LIPASE, BLOOD: Lipase: 16 U/L (ref 11–51)

## 2020-08-08 LAB — URINALYSIS, ROUTINE W REFLEX MICROSCOPIC
Bacteria, UA: NONE SEEN
Bilirubin Urine: NEGATIVE
Glucose, UA: >=500 mg/dL — AB
Ketones, ur: 20 mg/dL — AB
Leukocytes,Ua: NEGATIVE
Nitrite: NEGATIVE
Protein, ur: >=300 mg/dL — AB
Specific Gravity, Urine: 1.016 (ref 1.005–1.030)
pH: 6 (ref 5.0–8.0)

## 2020-08-08 LAB — CBC
HCT: 34.8 % — ABNORMAL LOW (ref 36.0–46.0)
Hemoglobin: 11.8 g/dL — ABNORMAL LOW (ref 12.0–15.0)
MCH: 30 pg (ref 26.0–34.0)
MCHC: 33.9 g/dL (ref 30.0–36.0)
MCV: 88.5 fL (ref 80.0–100.0)
Platelets: 258 10*3/uL (ref 150–400)
RBC: 3.93 MIL/uL (ref 3.87–5.11)
RDW: 12.4 % (ref 11.5–15.5)
WBC: 9.7 10*3/uL (ref 4.0–10.5)
nRBC: 0 % (ref 0.0–0.2)

## 2020-08-08 LAB — CBG MONITORING, ED
Glucose-Capillary: 119 mg/dL — ABNORMAL HIGH (ref 70–99)
Glucose-Capillary: 137 mg/dL — ABNORMAL HIGH (ref 70–99)
Glucose-Capillary: 220 mg/dL — ABNORMAL HIGH (ref 70–99)
Glucose-Capillary: 272 mg/dL — ABNORMAL HIGH (ref 70–99)
Glucose-Capillary: 277 mg/dL — ABNORMAL HIGH (ref 70–99)

## 2020-08-08 LAB — POC SARS CORONAVIRUS 2 AG -  ED: SARS Coronavirus 2 Ag: NEGATIVE

## 2020-08-08 LAB — I-STAT BETA HCG BLOOD, ED (MC, WL, AP ONLY): I-stat hCG, quantitative: <5 m[IU]/mL (ref <5)

## 2020-08-08 MED ORDER — LORAZEPAM 2 MG/ML IJ SOLN
1.0000 mg | Freq: Once | INTRAMUSCULAR | Status: AC
  Administered 2020-08-08: 1 mg via INTRAVENOUS
  Filled 2020-08-08: qty 1

## 2020-08-08 MED ORDER — SODIUM CHLORIDE 0.9 % IV BOLUS
1000.0000 mL | Freq: Once | INTRAVENOUS | Status: AC
  Administered 2020-08-08: 1000 mL via INTRAVENOUS

## 2020-08-08 MED ORDER — ALUM & MAG HYDROXIDE-SIMETH 200-200-20 MG/5ML PO SUSP
30.0000 mL | Freq: Once | ORAL | Status: AC
  Administered 2020-08-08: 30 mL via ORAL
  Filled 2020-08-08: qty 30

## 2020-08-08 MED ORDER — ONDANSETRON HCL 4 MG/2ML IJ SOLN
4.0000 mg | Freq: Once | INTRAMUSCULAR | Status: AC
  Administered 2020-08-08: 4 mg via INTRAVENOUS
  Filled 2020-08-08: qty 2

## 2020-08-08 MED ORDER — LIDOCAINE VISCOUS HCL 2 % MT SOLN
15.0000 mL | Freq: Once | OROMUCOSAL | Status: AC
  Administered 2020-08-08: 15 mL via ORAL
  Filled 2020-08-08: qty 15

## 2020-08-08 MED ORDER — CARVEDILOL 3.125 MG PO TABS
6.2500 mg | ORAL_TABLET | Freq: Once | ORAL | Status: AC
  Administered 2020-08-08: 6.25 mg via ORAL
  Filled 2020-08-08: qty 2

## 2020-08-08 MED ORDER — IOHEXOL 300 MG/ML  SOLN
100.0000 mL | Freq: Once | INTRAMUSCULAR | Status: AC | PRN
  Administered 2020-08-08: 100 mL via INTRAVENOUS

## 2020-08-08 MED ORDER — PROMETHAZINE HCL 25 MG/ML IJ SOLN
25.0000 mg | Freq: Once | INTRAMUSCULAR | Status: AC
  Administered 2020-08-08: 25 mg via INTRAVENOUS
  Filled 2020-08-08: qty 1

## 2020-08-08 NOTE — H&P (Signed)
History and Physical   [name] OYD:[id number] DOB: 08-21-1970 DOA: 08/08/2020  Referring MD/NP/PA: Dr. Vanita Panda  PCP: Megan Jeans, PA-C   Outpatient Specialists: None  Patient coming from: Home  Chief Complaint: Nausea with vomiting  HPI: [name] is a 50 y.o. female with medical history significant of type 1 diabetes on insulin pump, diastolic CHF, mood disorder, diabetic foot ulcer, diabetic neuropathy, migraine headaches who presented to the ER with intractable nausea and vomiting.  Patient reported that her insulin pump needle came out and she could not get her insulin.  She did not give herself any injection.  She has significant hyperglycemia with blood glucose in the 300s but had normal anion gap.  No DKA.  Patient was treated in the ER symptomatically but continued to vomit and unable to control it.  She also had some weakness and appears of some dehydration.  Patient therefore being admitted to the hospital at this point for symptomatic management.  She had previous episode of nausea vomiting.  In August 2020 she was evaluated for possible gastroparesis and the study was normal.  Patient being admitted for observation and symptomatic control today..  ED Course: Temperature 98.2 blood pressure 170/99 pulse 107 respirate 20 oxygen sat 98% room air.  Chemistry showed blood sugar 320 total protein 6.1 otherwise the rest of the numbers are within normal.  Urinalysis showed glucosuria otherwise no significant findings.  CBC mostly within normal.  CT abdomen pelvis showed no significant findings except for sigmoid diverticulosis.  Patient at this point is being admitted for observation and symptomatic treatment.  Review of Systems: As per HPI otherwise 10 point review of systems negative.    Past Medical History:  Diagnosis Date  . Anemia   . Anxiety   . Arthritis   . Asthma   . CHF (congestive heart failure) ([place])   . Depression   . History of  chicken pox   . Migraines   . Mood disorder (HCC)    anxiety  . Prurigo nodularis    with diabetic dermopathy  . Type 1 diabetes, uncontrolled, with neuropathy ([place])    [place]    Past Surgical History:  Procedure Laterality Date  . treadmill stress test  01/2013   WNL, low risk study     reports that she has never smoked. She has never used smokeless tobacco. She reports previous alcohol use. She reports that she does not use drugs.  Allergies  Allergen Reactions  . Amoxicillin Itching  . Dulaglutide     Other reaction(s): vomiting  . Miconazole Nitrate     Other reaction(s): rash  . Lantus [Insulin Glargine] Itching and Rash    Family History  Problem Relation Age of Onset  . Diabetes Mother        type 2  . Hypertension Mother   . Thyroid disease Mother   . Bipolar disorder Mother   . Heart disease Mother   . Calcium disorder Mother   . Cancer Maternal Grandmother        Breast, stomach  . Cancer Paternal Grandmother        stomach, lung (smoker)  . Diabetes Paternal Grandmother   . Diabetes Paternal Grandfather   . Heart failure Sister   . Diabetes Sister   . Stroke Maternal Aunt   . Cancer Maternal Uncle        prostate  . CAD Maternal Aunt        stents  . Cancer Maternal Aunt  64       ovarian     Prior to Admission medications   Medication Sig Start Date End Date Taking? Authorizing Provider  albuterol (PROAIR HFA) 108 (90 Base) MCG/ACT inhaler Inhale 2 puffs into the lungs every 6 (six) hours as needed for wheezing or shortness of breath. 07/03/20   Megan Jeans, PA-C  atorvastatin (LIPITOR) 10 MG tablet Take 10 mg by mouth daily.    [provider]  Betamethasone Valerate 0.12 % foam Apply thin layer topically twice daily to areas of hair loss. 04/17/20   Megan Jeans, PA-C  carvedilol (COREG) 6.25 MG tablet Take 1 tablet (6.25 mg total) by mouth 2 (two) times daily. 06/26/20   [name], MD  Continuous Blood Gluc Sensor  (FREESTYLE LIBRE 2 SENSOR) MISC CHANGE EVERY 14 DAYS 03/20/20   [provider]  hydrOXYzine (ATARAX/VISTARIL) 25 MG tablet Take 1 tablet (25 mg total) by mouth 3 (three) times daily as needed. 04/17/20   Megan Jeans, PA-C  Insulin Disposable Pump (OMNIPOD 10 PACK) MISC by Does not apply route.    [provider]  LORazepam (ATIVAN) 0.5 MG tablet TAKE 1 TABLET BY MOUTH EVERY 8 HOURS AS NEEDED FOR ANXIETY. 05/22/20   Megan Jeans, PA-C  NOVOLOG 100 UNIT/ML injection SMARTSIG:0-120 Unit(s) SUB-Q Daily 04/06/20   [provider]  ondansetron (ZOFRAN ODT) 4 MG disintegrating tablet Take 1 tablet (4 mg total) by mouth every 8 (eight) hours as needed for nausea or vomiting. 06/12/20   Truddie Hidden, MD  tobramycin (TOBREX) 0.3 % ophthalmic solution SMARTSIG:In Eye(s) 03/29/20   [provider]  torsemide (DEMADEX) 20 MG tablet TAKE 1 TABLET (20 MG TOTAL) BY MOUTH EVERY MONDAY, WEDNESDAY, AND FRIDAY. 06/19/20   [name], MD    Physical Exam: Vitals:   08/08/20 2000 08/08/20 2030 08/08/20 2100 08/08/20 2200  BP: (!) 171/107 (!) 162/98 (!) 146/89 (!) 174/103  Pulse: (!) 108 (!) 108 98 (!) 106  Resp: $Remo'17 18 18 20  'FracN$ Temp:      SpO2: 99% 99% 96% 96%      Constitutional: Acutely ill looking, retching Vitals:   08/08/20 2000 08/08/20 2030 08/08/20 2100 08/08/20 2200  BP: (!) 171/107 (!) 162/98 (!) 146/89 (!) 174/103  Pulse: (!) 108 (!) 108 98 (!) 106  Resp: $Remo'17 18 18 20  'BMAGn$ Temp:      SpO2: 99% 99% 96% 96%   Eyes: PERRL, lids and conjunctivae normal ENMT: Mucous membranes are dry. Posterior pharynx clear of any exudate or lesions.Normal dentition.  Neck: normal, supple, no masses, no thyromegaly Respiratory: clear to auscultation bilaterally, no wheezing, no crackles. Normal respiratory effort. No accessory muscle use.  Cardiovascular: Sinus tachycardia, no murmurs / rubs / gallops. No extremity edema. 2+ pedal pulses. No carotid bruits.  Abdomen:  no tenderness, no masses palpated. No hepatosplenomegaly. Bowel sounds positive.  Musculoskeletal: no clubbing / cyanosis. No joint deformity upper and lower extremities. Good ROM, no contractures. Normal muscle tone.  Skin: no rashes, lesions, ulcers. No induration Neurologic: CN 2-12 grossly intact. Sensation intact, DTR normal. Strength 5/5 in all 4.  Psychiatric: Normal judgment and insight. Alert and oriented x 3. Normal mood.     Labs on Admission: I have personally reviewed following labs and imaging studies  CBC: Recent Labs  Lab 08/07/20 1611 08/08/20 1535  WBC 5.2 9.7  HGB 11.7* 11.8*  HCT 34.6* 34.8*  MCV 88.5 88.5  PLT 274 258  Basic Metabolic Panel: Recent Labs  Lab 08/07/20 1611 08/08/20 1535  NA 134* 140  K 3.9 3.9  CL 100 103  CO2 27 26  GLUCOSE 349* 320*  BUN 16 23*  CREATININE 1.03* 0.93  CALCIUM 8.4* 8.7*   GFR: Estimated Creatinine Clearance: 83.5 mL/min (by C-G formula based on SCr of 0.93 mg/dL). Liver Function Tests: Recent Labs  Lab 08/07/20 1611 08/08/20 1535  AST 22 17  ALT 18 19  ALKPHOS 71 65  BILITOT 1.1 1.1  PROT 6.4* 6.1*  ALBUMIN 2.6* 2.5*   Recent Labs  Lab 08/07/20 1611 08/08/20 1535  LIPASE 18 16   No results for input(s): AMMONIA in the last 168 hours. Coagulation Profile: No results for input(s): INR, PROTIME in the last 168 hours. Cardiac Enzymes: No results for input(s): CKTOTAL, CKMB, CKMBINDEX, TROPONINI in the last 168 hours. BNP (last 3 results) Recent Labs    12/10/19 1116 01/06/20 0954 01/31/20 1008  PROBNP 831* 773* 1,696*   HbA1C: No results for input(s): HGBA1C in the last 72 hours. CBG: Recent Labs  Lab 08/08/20 0234 08/08/20 0516 08/08/20 1605 08/08/20 1846 08/08/20 1946  GLUCAP 119* 137* 220* 272* 277*   Lipid Profile: No results for input(s): CHOL, HDL, LDLCALC, TRIG, CHOLHDL, LDLDIRECT in the last 72 hours. Thyroid Function Tests: No results for input(s): TSH, T4TOTAL, FREET4,  T3FREE, THYROIDAB in the last 72 hours. Anemia Panel: No results for input(s): VITAMINB12, FOLATE, FERRITIN, TIBC, IRON, RETICCTPCT in the last 72 hours. Urine analysis:    Component Value Date/Time   COLORURINE YELLOW 08/08/2020 1610   APPEARANCEUR HAZY (A) 08/08/2020 1610   LABSPEC 1.016 08/08/2020 1610   PHURINE 6.0 08/08/2020 1610   GLUCOSEU >=500 (A) 08/08/2020 1610   HGBUR MODERATE (A) 08/08/2020 1610   BILIRUBINUR NEGATIVE 08/08/2020 1610   BILIRUBINUR negative 04/23/2019 1658   KETONESUR 20 (A) 08/08/2020 1610   PROTEINUR >=300 (A) 08/08/2020 1610   UROBILINOGEN 0.2 04/23/2019 1658   UROBILINOGEN 1.0 08/11/2008 1745   NITRITE NEGATIVE 08/08/2020 1610   LEUKOCYTESUR NEGATIVE 08/08/2020 1610   Sepsis Labs: $RemoveBefo'[email or web address]'[email or web address]$ (procalcitonin:4,lacticidven:4) )No results found for this or any previous visit (from the past 240 hour(s)).   Radiological Exams on Admission: CT Abdomen Pelvis W Contrast  Result Date: 08/08/2020 CLINICAL DATA:  Abdominal pain. EXAM: CT ABDOMEN AND PELVIS WITH CONTRAST TECHNIQUE: Multidetector CT imaging of the abdomen and pelvis was performed using the standard protocol following bolus administration of intravenous contrast. CONTRAST:  141mL OMNIPAQUE IOHEXOL 300 MG/ML  SOLN COMPARISON:  October 11, 2019 FINDINGS: Lower chest: No acute abnormality. Hepatobiliary: No focal liver abnormality is seen. No gallstones, gallbladder wall thickening, or biliary dilatation. Pancreas: Unremarkable. No pancreatic ductal dilatation or surrounding inflammatory changes. Spleen: Normal in size without focal abnormality. Adrenals/Urinary Tract: Adrenal glands are unremarkable. Kidneys are normal, without renal calculi, focal lesion, or hydronephrosis. Bladder is unremarkable. Stomach/Bowel: There is a small, stable hiatal hernia. Mild diffuse distal esophageal wall thickening is noted. This represents a new finding when compared to the prior study. Appendix appears normal. No evidence of  bowel dilatation. Noninflamed diverticula are seen throughout the sigmoid colon. Vascular/Lymphatic: No significant vascular findings are present. No enlarged abdominal or pelvic lymph nodes. Reproductive: A predominantly stable 2.1 cm x 2.3 cm mildly hyperdense soft tissue mass is seen within the uterus on the right. This is present on the prior exam. Other: There is a stable 2.1 cm x 1.5 cm fat containing umbilical hernia. A trace amount of posterior pelvic  free fluid is noted. Musculoskeletal: No acute or significant osseous findings. IMPRESSION: 1. Small, stable hiatal hernia with additional findings which may represent mild esophagitis. 2. Sigmoid diverticulosis. 3. Stable hyperdense uterine fibroid. 4. Trace amount of posterior pelvic free fluid, likely physiologic. Electronically Signed   By: Virgina Norfolk M.D.   On: 08/08/2020 22:09    Assessment/Plan Principal Problem:   Intractable nausea and vomiting Active Problems:   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)   Mood disorder (HCC)   Diabetic foot ulcers (HCC)   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[place])     #1 intractable nausea with vomiting: Patient will be admitted for symptomatic control.  It could be secondary to her hyperglycemia.  No evidence of other causes.  We may check drug screen to rule out any other medications.  Patient also has had work-up for gastroparesis 2 years ago.  So far no evidence of persistent gastroparesis.  Things may have changed since then so if symptoms do not control overnight we may consider gastric emptying study again.  #2 type 1 diabetes: Uncontrolled.  Has not been getting her insulin pump working.  Sliding scale insulin at this point.  Resume her insulin pump when fixed.  #3 diabetic neuropathy with leg ulcers: Continue outpatient follow-up and treatment  #4 mood disorders: Confirm on resume home regimen.   DVT prophylaxis: Lovenox Code Status: Full code Family Communication:  No family at bedside Disposition Plan: Home Consults called: None Admission status: Observation  Severity of Illness: The appropriate patient status for this patient is OBSERVATION. Observation status is judged to be reasonable and necessary in order to provide the required intensity of service to ensure the patient's safety. The patient's presenting symptoms, physical exam findings, and initial radiographic and laboratory data in the context of their medical condition is felt to place them at decreased risk for further clinical deterioration. Furthermore, it is anticipated that the patient will be medically stable for discharge from the hospital within 2 midnights of admission. The following factors support the patient status of observation.   " The patient's presenting symptoms include nausea and vomiting. " The physical exam findings include dehydration. " The initial radiographic and laboratory data are no significant findings but hyperglycemia.     Barbette Merino MD [facility] Pager [phone]  If 7PM-7AM, please contact night-coverage [email or web address] Password [facility]  08/08/2020, 10:58 PM

## 2020-08-08 NOTE — ED Provider Notes (Addendum)
[facility] DEPT Provider Note   CSN: [id number] Arrival date & time: 08/08/20  0157     History Chief Complaint  Patient presents with  . Emesis    [name] is a 50 y.o. female.  The history is provided by the patient. No language interpreter was used.  Emesis Severity:  Moderate Timing:  Constant Number of daily episodes:  Multiple Able to tolerate:  Liquids Progression:  Worsening Chronicity:  New Relieved by:  Nothing Ineffective treatments:  None tried Risk factors: no sick contacts   Pt complains of vomiting since 1/1. Pt reports she has vomited up black fluid.  Pt reports her insulin pump needle came out and she could not get her insulin in.  Pt was able to give herself an injection of insulin.  Pt reports she is unable to drink fluids.      Past Medical History:  Diagnosis Date  . Anemia   . Anxiety   . Arthritis   . Asthma   . CHF (congestive heart failure) ([place])   . Depression   . History of chicken pox   . Migraines   . Mood disorder (HCC)    anxiety  . Prurigo nodularis    with diabetic dermopathy  . Type 1 diabetes, uncontrolled, with neuropathy [facility])    [place]    Patient Active Problem List   Diagnosis Date Noted  . Bone pain 06/16/2020  . Deficiency anemia 04/21/2020  . Preventive measure 04/21/2020  . Heart failure with normal ejection fraction ([place]) 12/14/2019  . Abdominal pain, epigastric 12/02/2019  . Constipation 12/02/2019  . Loss of weight 12/02/2019  . Leg edema 11/21/2019  . Uncontrolled type 1 diabetes mellitus with hyperglycemia, with long-term current use of insulin ([place]) 10/11/2019  . Dysfunctional uterine bleeding 10/11/2019  . Mild renal insufficiency 10/11/2019  . 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[place]) 07/30/2016  . Multiple thyroid nodules 07/30/2016  . Complicated grieving  40/05/2724  . Ulcers of both lower legs ([place]) 02/20/2014  . Health maintenance examination 09/20/2013  . Diabetic ulcer of left foot ([place]) 05/12/2013  . Diabetic foot ulcers ([place]) 05/01/2013  . Skin rash 01/05/2013  . Contraception management 12/31/2012  . Mood disorder ([place]) 12/30/2012  . Unspecified vitamin D deficiency 12/03/2012  . H/O thyroid nodule 12/03/2012  . Type 1 diabetes mellitus with diabetic neuropathy ([place]) 06/18/2007    Past Surgical History:  Procedure Laterality Date  . treadmill stress test  01/2013   WNL, low risk study     OB History    Gravida  6   Para  2   Term      Preterm      AB  4   Living  2     SAB  4   IAB      Ectopic      Multiple      Live Births              Family History  Problem Relation Age of Onset  . Diabetes Mother        type 2  . Hypertension Mother   . Thyroid disease Mother   . Bipolar disorder Mother   . Heart disease Mother   . Calcium disorder Mother   . Cancer Maternal Grandmother        Breast, stomach  . Cancer Paternal Grandmother  stomach, lung (smoker)  . Diabetes Paternal Grandmother   . Diabetes Paternal Grandfather   . Heart failure Sister   . Diabetes Sister   . Stroke Maternal Aunt   . Cancer Maternal Uncle        prostate  . CAD Maternal Aunt        stents  . Cancer Maternal Aunt 47       ovarian    Social History   Tobacco Use  . Smoking status: Never Smoker  . Smokeless tobacco: Never Used  Vaping Use  . Vaping Use: Never used  Substance Use Topics  . Alcohol use: Not Currently  . Drug use: No    Home Medications Prior to Admission medications   Medication Sig Start Date End Date Taking? Authorizing Provider  albuterol (PROAIR HFA) 108 (90 Base) MCG/ACT inhaler Inhale 2 puffs into the lungs every 6 (six) hours as needed for wheezing or shortness of breath. 07/03/20   Waldon Merl, PA-C  atorvastatin (LIPITOR) 10 MG tablet Take 10 mg by mouth daily.     [provider]  Betamethasone Valerate 0.12 % foam Apply thin layer topically twice daily to areas of hair loss. 04/17/20   Waldon Merl, PA-C  carvedilol (COREG) 6.25 MG tablet Take 1 tablet (6.25 mg total) by mouth 2 (two) times daily. 06/26/20   Lyn Records, MD  Continuous Blood Gluc Sensor (FREESTYLE LIBRE 2 SENSOR) MISC CHANGE EVERY 14 DAYS 03/20/20   [provider]  hydrOXYzine (ATARAX/VISTARIL) 25 MG tablet Take 1 tablet (25 mg total) by mouth 3 (three) times daily as needed. 04/17/20   Waldon Merl, PA-C  Insulin Disposable Pump (OMNIPOD 10 PACK) MISC by Does not apply route.    [provider]  LORazepam (ATIVAN) 0.5 MG tablet TAKE 1 TABLET BY MOUTH EVERY 8 HOURS AS NEEDED FOR ANXIETY. 05/22/20   Waldon Merl, PA-C  NOVOLOG 100 UNIT/ML injection SMARTSIG:0-120 Unit(s) SUB-Q Daily 04/06/20   [provider]  ondansetron (ZOFRAN ODT) 4 MG disintegrating tablet Take 1 tablet (4 mg total) by mouth every 8 (eight) hours as needed for nausea or vomiting. 06/12/20   Pollyann Savoy, MD  tobramycin (TOBREX) 0.3 % ophthalmic solution SMARTSIG:In Eye(s) 03/29/20   [provider]  torsemide (DEMADEX) 20 MG tablet TAKE 1 TABLET (20 MG TOTAL) BY MOUTH EVERY MONDAY, WEDNESDAY, AND FRIDAY. 06/19/20   Lyn Records, MD    Allergies    Amoxicillin and Lantus [insulin glargine]  Review of Systems   Review of Systems  Gastrointestinal: Positive for vomiting.  All other systems reviewed and are negative.   Physical Exam Updated Vital Signs BP 126/88 (BP Location: Left Arm)   Pulse (!) 103   Temp 98.2 F (36.8 C)   Resp 16   LMP 08/06/2020   SpO2 100%   Physical Exam Vitals and nursing note reviewed.  Constitutional:      Appearance: She is well-developed and well-nourished.  HENT:     Head: Normocephalic.  Eyes:     Extraocular Movements: EOM normal.  Cardiovascular:     Rate and Rhythm: Tachycardia present.  Pulmonary:      Effort: Pulmonary effort is normal.  Abdominal:     General: There is no distension.  Musculoskeletal:        General: Normal range of motion.     Cervical back: Normal range of motion.  Skin:    General: Skin is warm.  Neurological:  General: No focal deficit present.     Mental Status: She is alert and oriented to person, place, and time.  Psychiatric:        Mood and Affect: Mood and affect and mood normal.     ED Results / Procedures / Treatments   Labs (all labs ordered are listed, but only abnormal results are displayed) Labs Reviewed  CBG MONITORING, ED - Abnormal; Notable for the following components:      Result Value   Glucose-Capillary 119 (*)    All other components within normal limits  CBG MONITORING, ED - Abnormal; Notable for the following components:   Glucose-Capillary 137 (*)    All other components within normal limits  LIPASE, BLOOD  COMPREHENSIVE METABOLIC PANEL  CBC  URINALYSIS, ROUTINE W REFLEX MICROSCOPIC  I-STAT BETA HCG BLOOD, ED ([facility], [facility], [facility] ONLY)  POC SARS CORONAVIRUS 2 AG -  ED    EKG None  Radiology No results found.  Procedures Procedures (including critical care time)  Medications Ordered in ED Medications - No data to display  ED Course  I have reviewed the triage vital signs and the nursing notes.  Pertinent labs & imaging results that were available during my care of the patient were reviewed by me and considered in my medical decision making (see chart for details).    MDM Rules/Calculators/A&P                          MDM: Nurse and IV team unable to obtain blood or IV access. Dr. Gustavus Messing will try IV ultrasound Final Clinical Impression(s) / ED Diagnoses Final diagnoses:  Nausea and vomiting, intractability of vomiting not specified, unspecified vomiting type    Rx / DC Orders ED Discharge Orders    None       Sidney Ace 08/08/20 1514    Fransico Meadow, [place] 08/08/20 1517    Milton Ferguson, MD 08/10/20 1040

## 2020-08-08 NOTE — Telephone Encounter (Signed)
Pt called in stating that she has been at [facility] for over 7 hours. She states that the wait to be seen is going to be over 20 hrs. She wanted to know what Einar Pheasant wanted her to do. I did advise pt that she is where she needs to be but she wanted to make [facility] aware of this and see what his thoughts were.   Pt can be reached at the cell #

## 2020-08-08 NOTE — ED Notes (Addendum)
IV team was also unsuccessful placing an IV. PA made aware.

## 2020-08-08 NOTE — Discharge Instructions (Addendum)
[name],  You were in the hospital because of nausea/vomiting of unknown cause. You were also found to have erosive esophagitis (inflammation/damage of your esophagus). Your medications have been adjusted. Please take them as prescribed and follow-up with your GI and primary care physicians.

## 2020-08-08 NOTE — ED Notes (Signed)
Blood draw attempted, but unsuccessful at this time.

## 2020-08-08 NOTE — ED Provider Notes (Addendum)
Care of the patient was assumed from K. Sophia  PA-C at 57; see this physician's note for complete history of present illness, review of systems, and physical exam.  Briefly, the patient is a 50 y.o. female who presented to the ED with emesis.  Patient states that she has vomited up black fluid yesterday, states that she has been having emesis for the past 3 days.  Had one episode of black emesis, after this has had clear emesis.  Patient is actively spitting up while I am in the room, is clear.  States that she has not been able to tolerate fluids or food since then.  Patient also admits to generalized abdominal pain.  States that she noticed that her insulin pump was malfunctioning yesterday, called EMS today because it read high.  Patient states that she has been able to inject insulin, however thinks that since the insulin pump was malfunctioning it was not actually going in.  States that she turned off her insulin pump.  States that she has been compliant with her insulin, takes Lantus.  Denies any fevers, chills.  Denies any sick contacts.  States that she has not had a bowel movement in 4 days due to her decreased appetite.  Has not tried anything for this.  Denies any vaginal complaints.  Patient states that she thinks she started having emesis due to eating food on the first that she was skeptical of including collard greens.  States that she vomited 4 hours after this.  Plan at time of handoff:  Awaiting labs.  Hydration.     Physical Exam  BP (!) 144/83   Pulse (!) 108   Temp 98.2 F (36.8 C)   Resp 18   LMP 08/06/2020   SpO2 97%   Physical Exam Constitutional:      General: She is in acute distress.     Appearance: Normal appearance. She is not ill-appearing, toxic-appearing or diaphoretic.     Comments: Patient spitting up clear saliva, appears uncomfortable  HENT:     Mouth/Throat:     Mouth: Mucous membranes are dry.     Pharynx: Oropharynx is clear.  Eyes:     General: No  scleral icterus.    Extraocular Movements: Extraocular movements intact.     Pupils: Pupils are equal, round, and reactive to light.  Cardiovascular:     Rate and Rhythm: Regular rhythm. Tachycardia present.     Pulses: Normal pulses.     Heart sounds: Normal heart sounds.  Pulmonary:     Effort: Pulmonary effort is normal. No respiratory distress.     Breath sounds: Normal breath sounds. No stridor. No wheezing, rhonchi or rales.  Chest:     Chest wall: No tenderness.  Abdominal:     General: Abdomen is flat. There is no distension.     Palpations: Abdomen is soft.     Tenderness: There is abdominal tenderness. There is no guarding or rebound.     Comments: Generalized, no rebound.  Abdomen is soft.  Musculoskeletal:        General: No swelling or tenderness. Normal range of motion.     Cervical back: Normal range of motion and neck supple. No rigidity or tenderness.     Right lower leg: No edema.     Left lower leg: No edema.  Skin:    General: Skin is warm and dry.     Capillary Refill: Capillary refill takes less than 2 seconds.  Coloration: Skin is not pale.  Neurological:     General: No focal deficit present.     Mental Status: She is alert and oriented to person, place, and time.     Cranial Nerves: No cranial nerve deficit.     Sensory: No sensory deficit.     Motor: No weakness.     Coordination: Coordination normal.     Gait: Gait normal.  Psychiatric:        Mood and Affect: Mood normal.        Behavior: Behavior normal.     ED Course/Procedures     Procedures  MDM   Patient is a 50 year old female with diabetes, acid reflux coming into the emergency department today for emesis.  Patient does appear dehydrated.  Did have some malfunctioning of her insulin pump yesterday.  Thinks that her sugars are running high yesterday, in the emergency department today her glucose is 320, anion gap 11.  Normal CO2.  CMP otherwise unremarkable, slightly elevated BUN.   CBC unremarkable, hemoglobin stable.  Normal lipase, urinalysis with concerns of dehydration.   Patient has been given Zofran and fluids, after reevaluation patient still nauseous.  Has stopped spitting up fluid.  We will also give GI cocktail in regards to patient's acid reflux as this could be contributing and also Phenergan.   Upon reevaluation patient still is nauseous and spitting up with Phenergan and Zofran on board, has been given 2 L of IV fluids at this point.  Patient tachycardic to 108.  CT abdomen negative.  Will try Ativan at this time. Patient continues to feel nauseous, did not pass p.o. challenge.  Patient remains tachycardic. Shared decision-making about patient coming into the emergency department today for intractable nausea and vomiting.  Patient does want this at this time.  Spoke to Dr. Jonelle Sidle, hospitalist will accept the patient. The patient appears reasonably stabilized for admission considering the current resources, flow, and capabilities available in the ED at this time, and I doubt any other [facility] requiring further screening and/or treatment in the ED prior to admission.       Alfredia Client, PA-C 08/08/20 2304    Carmin Muskrat, MD 08/12/20 [phone]

## 2020-08-08 NOTE — Telephone Encounter (Signed)
Advised patient that PCP is aware she is at the hospital. The hospital is where she needs to be. I hate that her wait will be 20 hrs and she has already been there 7 hours. She states she has been vomiting blood since 1 am this morning.

## 2020-08-08 NOTE — ED Triage Notes (Signed)
Pt arriving via [facility] for abdominal pain. Pt seen at [facility] for same around 3pm and left AMA. Pt reports her insulin pump broke yesterday, husband gave her insulin subQ. EMS reports pts blood sugar has come down some, currently at 258. Pt also presents with coffee-ground like emesis. Took Zofran around midnight.

## 2020-08-08 NOTE — ED Notes (Signed)
Report called to [employer].

## 2020-08-08 NOTE — ED Notes (Signed)
Pt had bloodwork at med center this afternoon

## 2020-08-09 DIAGNOSIS — E785 Hyperlipidemia, unspecified: Secondary | ICD-10-CM | POA: Diagnosis present

## 2020-08-09 DIAGNOSIS — D649 Anemia, unspecified: Secondary | ICD-10-CM | POA: Diagnosis present

## 2020-08-09 DIAGNOSIS — R1115 Cyclical vomiting syndrome unrelated to migraine: Secondary | ICD-10-CM | POA: Diagnosis present

## 2020-08-09 DIAGNOSIS — R944 Abnormal results of kidney function studies: Secondary | ICD-10-CM | POA: Diagnosis present

## 2020-08-09 DIAGNOSIS — E1140 Type 2 diabetes mellitus with diabetic neuropathy, unspecified: Secondary | ICD-10-CM | POA: Diagnosis not present

## 2020-08-09 DIAGNOSIS — E1040 Type 1 diabetes mellitus with diabetic neuropathy, unspecified: Secondary | ICD-10-CM | POA: Diagnosis present

## 2020-08-09 DIAGNOSIS — K2950 Unspecified chronic gastritis without bleeding: Secondary | ICD-10-CM | POA: Diagnosis present

## 2020-08-09 DIAGNOSIS — F419 Anxiety disorder, unspecified: Secondary | ICD-10-CM | POA: Diagnosis present

## 2020-08-09 DIAGNOSIS — Z20822 Contact with and (suspected) exposure to covid-19: Secondary | ICD-10-CM | POA: Diagnosis present

## 2020-08-09 DIAGNOSIS — E860 Dehydration: Secondary | ICD-10-CM | POA: Diagnosis present

## 2020-08-09 DIAGNOSIS — E876 Hypokalemia: Secondary | ICD-10-CM | POA: Diagnosis not present

## 2020-08-09 DIAGNOSIS — Z823 Family history of stroke: Secondary | ICD-10-CM | POA: Diagnosis not present

## 2020-08-09 DIAGNOSIS — R112 Nausea with vomiting, unspecified: Secondary | ICD-10-CM | POA: Diagnosis present

## 2020-08-09 DIAGNOSIS — Z9641 Presence of insulin pump (external) (internal): Secondary | ICD-10-CM | POA: Diagnosis present

## 2020-08-09 DIAGNOSIS — R1084 Generalized abdominal pain: Secondary | ICD-10-CM | POA: Diagnosis present

## 2020-08-09 DIAGNOSIS — I5032 Chronic diastolic (congestive) heart failure: Secondary | ICD-10-CM | POA: Diagnosis present

## 2020-08-09 DIAGNOSIS — R933 Abnormal findings on diagnostic imaging of other parts of digestive tract: Secondary | ICD-10-CM | POA: Diagnosis not present

## 2020-08-09 DIAGNOSIS — K2100 Gastro-esophageal reflux disease with esophagitis, without bleeding: Secondary | ICD-10-CM | POA: Diagnosis present

## 2020-08-09 DIAGNOSIS — Z833 Family history of diabetes mellitus: Secondary | ICD-10-CM | POA: Diagnosis not present

## 2020-08-09 DIAGNOSIS — F39 Unspecified mood [affective] disorder: Secondary | ICD-10-CM | POA: Diagnosis present

## 2020-08-09 DIAGNOSIS — Z8249 Family history of ischemic heart disease and other diseases of the circulatory system: Secondary | ICD-10-CM | POA: Diagnosis not present

## 2020-08-09 DIAGNOSIS — J45909 Unspecified asthma, uncomplicated: Secondary | ICD-10-CM | POA: Diagnosis present

## 2020-08-09 DIAGNOSIS — K319 Disease of stomach and duodenum, unspecified: Secondary | ICD-10-CM | POA: Diagnosis present

## 2020-08-09 DIAGNOSIS — R509 Fever, unspecified: Secondary | ICD-10-CM | POA: Diagnosis not present

## 2020-08-09 DIAGNOSIS — K317 Polyp of stomach and duodenum: Secondary | ICD-10-CM | POA: Diagnosis present

## 2020-08-09 DIAGNOSIS — I110 Hypertensive heart disease with heart failure: Secondary | ICD-10-CM | POA: Diagnosis present

## 2020-08-09 DIAGNOSIS — E1065 Type 1 diabetes mellitus with hyperglycemia: Secondary | ICD-10-CM | POA: Diagnosis present

## 2020-08-09 LAB — GLUCOSE, CAPILLARY
Glucose-Capillary: 310 mg/dL — ABNORMAL HIGH (ref 70–99)
Glucose-Capillary: 267 mg/dL — ABNORMAL HIGH (ref 70–99)
Glucose-Capillary: 193 mg/dL — ABNORMAL HIGH (ref 70–99)
Glucose-Capillary: 151 mg/dL — ABNORMAL HIGH (ref 70–99)
Glucose-Capillary: 147 mg/dL — ABNORMAL HIGH (ref 70–99)

## 2020-08-09 LAB — COMPREHENSIVE METABOLIC PANEL
ALT: 15 U/L (ref 0–44)
AST: 14 U/L — ABNORMAL LOW (ref 15–41)
Albumin: 2 g/dL — ABNORMAL LOW (ref 3.5–5.0)
Alkaline Phosphatase: 50 U/L (ref 38–126)
Anion gap: 9 (ref 5–15)
BUN: 24 mg/dL — ABNORMAL HIGH (ref 6–20)
CO2: 24 mmol/L (ref 22–32)
Calcium: 7.7 mg/dL — ABNORMAL LOW (ref 8.9–10.3)
Chloride: 109 mmol/L (ref 98–111)
Creatinine, Ser: 1.22 mg/dL — ABNORMAL HIGH (ref 0.44–1.00)
GFR, Estimated: 54 mL/min — ABNORMAL LOW (ref >60)
Glucose, Bld: 292 mg/dL — ABNORMAL HIGH (ref 70–99)
Potassium: 3.9 mmol/L (ref 3.5–5.1)
Sodium: 142 mmol/L (ref 135–145)
Total Bilirubin: 0.7 mg/dL (ref 0.3–1.2)
Total Protein: 4.6 g/dL — ABNORMAL LOW (ref 6.5–8.1)

## 2020-08-09 LAB — CBC
HCT: 30.6 % — ABNORMAL LOW (ref 36.0–46.0)
HCT: 29.7 % — ABNORMAL LOW (ref 36.0–46.0)
Hemoglobin: 10.3 g/dL — ABNORMAL LOW (ref 12.0–15.0)
Hemoglobin: 9.8 g/dL — ABNORMAL LOW (ref 12.0–15.0)
MCH: 29.9 pg (ref 26.0–34.0)
MCH: 29.4 pg (ref 26.0–34.0)
MCHC: 33.7 g/dL (ref 30.0–36.0)
MCHC: 33 g/dL (ref 30.0–36.0)
MCV: 88.7 fL (ref 80.0–100.0)
MCV: 89.2 fL (ref 80.0–100.0)
Platelets: 250 10*3/uL (ref 150–400)
Platelets: 237 10*3/uL (ref 150–400)
RBC: 3.45 MIL/uL — ABNORMAL LOW (ref 3.87–5.11)
RBC: 3.33 MIL/uL — ABNORMAL LOW (ref 3.87–5.11)
RDW: 12.5 % (ref 11.5–15.5)
RDW: 12.5 % (ref 11.5–15.5)
WBC: 6.6 10*3/uL (ref 4.0–10.5)
WBC: 6.3 10*3/uL (ref 4.0–10.5)
nRBC: 0 % (ref 0.0–0.2)
nRBC: 0 % (ref 0.0–0.2)

## 2020-08-09 LAB — HEMOGLOBIN A1C
Hgb A1c MFr Bld: 7.5 % — ABNORMAL HIGH (ref 4.8–5.6)
Mean Plasma Glucose: 168.55 mg/dL

## 2020-08-09 LAB — BRAIN NATRIURETIC PEPTIDE: B Natriuretic Peptide: 588.9 pg/mL — ABNORMAL HIGH (ref 0.0–100.0)

## 2020-08-09 LAB — TROPONIN I (HIGH SENSITIVITY)
Troponin I (High Sensitivity): 19 ng/L — ABNORMAL HIGH (ref <18)
Troponin I (High Sensitivity): 20 ng/L — ABNORMAL HIGH (ref <18)

## 2020-08-09 LAB — CREATININE, SERUM
Creatinine, Ser: 1.25 mg/dL — ABNORMAL HIGH (ref 0.44–1.00)
GFR, Estimated: 53 mL/min — ABNORMAL LOW (ref >60)

## 2020-08-09 LAB — D-DIMER, QUANTITATIVE: D-Dimer, Quant: 2.55 ug/mL-FEU — ABNORMAL HIGH (ref 0.00–0.50)

## 2020-08-09 LAB — LACTIC ACID, PLASMA
Lactic Acid, Venous: 1 mmol/L (ref 0.5–1.9)
Lactic Acid, Venous: 1.2 mmol/L (ref 0.5–1.9)

## 2020-08-09 MED ORDER — MORPHINE SULFATE (PF) 2 MG/ML IV SOLN
2.0000 mg | INTRAVENOUS | Status: DC | PRN
  Administered 2020-08-09 – 2020-08-16 (×22): 2 mg via INTRAVENOUS
  Filled 2020-08-09 (×24): qty 1

## 2020-08-09 MED ORDER — ENOXAPARIN SODIUM 40 MG/0.4ML ~~LOC~~ SOLN
40.0000 mg | SUBCUTANEOUS | Status: DC
  Administered 2020-08-09 – 2020-08-10 (×2): 40 mg via SUBCUTANEOUS
  Filled 2020-08-09 (×2): qty 0.4

## 2020-08-09 MED ORDER — LABETALOL HCL 5 MG/ML IV SOLN
10.0000 mg | Freq: Once | INTRAVENOUS | Status: AC
  Administered 2020-08-09: 10 mg via INTRAVENOUS
  Filled 2020-08-09: qty 4

## 2020-08-09 MED ORDER — INSULIN ASPART 100 UNIT/ML ~~LOC~~ SOLN
0.0000 [IU] | SUBCUTANEOUS | Status: DC
  Administered 2020-08-09: 3 [IU] via SUBCUTANEOUS
  Administered 2020-08-09: 2 [IU] via SUBCUTANEOUS
  Administered 2020-08-09 – 2020-08-10 (×3): 3 [IU] via SUBCUTANEOUS
  Administered 2020-08-10: 2 [IU] via SUBCUTANEOUS
  Administered 2020-08-11: 3 [IU] via SUBCUTANEOUS
  Administered 2020-08-11: 2 [IU] via SUBCUTANEOUS
  Administered 2020-08-11: 3 [IU] via SUBCUTANEOUS
  Administered 2020-08-12: 2 [IU] via SUBCUTANEOUS
  Administered 2020-08-12: 3 [IU] via SUBCUTANEOUS
  Administered 2020-08-12: 2 [IU] via SUBCUTANEOUS
  Administered 2020-08-12 – 2020-08-13 (×3): 3 [IU] via SUBCUTANEOUS
  Administered 2020-08-13: 5 [IU] via SUBCUTANEOUS
  Administered 2020-08-14: 2 [IU] via SUBCUTANEOUS

## 2020-08-09 MED ORDER — INSULIN DETEMIR 100 UNIT/ML ~~LOC~~ SOLN
10.0000 [IU] | Freq: Every day | SUBCUTANEOUS | Status: DC
  Administered 2020-08-09 – 2020-08-16 (×8): 10 [IU] via SUBCUTANEOUS
  Filled 2020-08-09 (×8): qty 0.1

## 2020-08-09 MED ORDER — PROMETHAZINE HCL 25 MG/ML IJ SOLN
12.5000 mg | Freq: Four times a day (QID) | INTRAMUSCULAR | Status: DC | PRN
  Administered 2020-08-09 – 2020-08-10 (×5): 12.5 mg via INTRAVENOUS
  Filled 2020-08-09 (×5): qty 1

## 2020-08-09 MED ORDER — LACTATED RINGERS IV SOLN: INTRAVENOUS | Status: DC

## 2020-08-09 MED ORDER — INSULIN ASPART 100 UNIT/ML ~~LOC~~ SOLN
0.0000 [IU] | Freq: Every day | SUBCUTANEOUS | Status: DC
  Administered 2020-08-09: 4 [IU] via SUBCUTANEOUS

## 2020-08-09 MED ORDER — ONDANSETRON HCL 4 MG PO TABS: 4.0000 mg | ORAL_TABLET | Freq: Four times a day (QID) | ORAL | Status: DC | PRN

## 2020-08-09 MED ORDER — ONDANSETRON HCL 4 MG/2ML IJ SOLN
4.0000 mg | Freq: Four times a day (QID) | INTRAMUSCULAR | Status: DC
  Administered 2020-08-09 – 2020-08-14 (×21): 4 mg via INTRAVENOUS
  Filled 2020-08-09 (×21): qty 2

## 2020-08-09 MED ORDER — INSULIN ASPART 100 UNIT/ML ~~LOC~~ SOLN
0.0000 [IU] | Freq: Three times a day (TID) | SUBCUTANEOUS | Status: DC
  Administered 2020-08-09: 11 [IU] via SUBCUTANEOUS

## 2020-08-09 MED ORDER — ONDANSETRON HCL 4 MG/2ML IJ SOLN
4.0000 mg | Freq: Four times a day (QID) | INTRAMUSCULAR | Status: DC | PRN
  Administered 2020-08-09 (×2): 4 mg via INTRAVENOUS
  Filled 2020-08-09 (×3): qty 2

## 2020-08-09 MED ORDER — PROMETHAZINE HCL 25 MG/ML IJ SOLN
12.5000 mg | Freq: Once | INTRAMUSCULAR | Status: AC
  Administered 2020-08-09: 12.5 mg via INTRAVENOUS
  Filled 2020-08-09: qty 1

## 2020-08-09 NOTE — Progress Notes (Signed)
Inpatient Diabetes Program Recommendations  AACE/ADA: New Consensus Statement on Inpatient Glycemic Control (2015)  Target Ranges:  Prepandial:   less than 140 mg/dL      Peak postprandial:   less than 180 mg/dL (1-2 hours)      Critically ill patients:  140 - 180 mg/dL   Lab Results  Component Value Date   GLUCAP 267 (H) 08/09/2020   HGBA1C 7.5 (H) 08/09/2020    Review of Glycemic Control Results for TAMANA, HATFIELD (MRN [id number]) as of 08/09/2020 11:27  Ref. Range 08/08/2020 19:46 08/09/2020 01:10 08/09/2020 07:58  Glucose-Capillary Latest Ref Range: 70 - 99 mg/dL 277 (H) 310 (H) 267 (H)   Diabetes history: Type 1 DM Outpatient Diabetes medications: Omnipod Current orders for Inpatient glycemic control: Novolog 0-20 units TID & HS  Inpatient Diabetes Program Recommendations:    Consider adding Levemir 18 units QD (to start now). Patient is type 1 DM and has not received basal since admission.  If patient unable to advance diet, would consider Novolog 0-9 units Q4H. Spoke with patient at bedside, patietn does not have additional supplies for insulin pump application with her in the hospital. Reviewed outpatient settings, as patient is followed by Dr Buddy Duty, outpatient endocrinology.   Current insulin pump settings are as follows:  Basal insulin  0000-0000 1.0 units/hour Total daily basal insulin: 24 units/24 hours  Carb Coverage 1:9 1 unit for every 9 grams of carbohydrates  Insulin Sensitivity 1:35 1 unit drops blood glucose 35 mg/dl  Target Glucose Goals 0000-0000 110 mg/dl Secure chat sent to md.   Thanks, Bronson Curb, MSN, RNC-OB Diabetes Coordinator [phone] (8a-5p)

## 2020-08-09 NOTE — Progress Notes (Signed)
PROGRESS NOTE    [name]  DDU:[id number] DOB: 01/03/1971 DOA: 08/08/2020 PCP: [name], PA-C   Brief Narrative: [name] is a 50 y.o. female with a history of diabetes type 1 on insulin pump, diastolic heart failure, foot ulcer, diabetic neuropathy, migraine headaches. Patient presented secondary nausea and vomiting requiring IV antiemetics.   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 Previous workup for gastroparesis in 2020 significant for a normal gastric emptying study. No recent illness symptoms. No associated diarrhea. Patient with persistent nausea with vomiting and retching.  -Continue IV fluids while unable to take PO -Clear liquids as able -Scheduled Zofran IV; add phenergan prn for refractory symptoms  Diabetes mellitus, type 1 Patient manages with an insulin pump for which stopped working two days prior. No evidence of DKA on admission labs. Allergy to Lantus listed. -Levemir 10 units daily whille not taking oral nutrition -SSI q4 hours while not taking oral nutrition  Chronic diastolic heart failure Patient is on torsemide as an outpatient -Hold torsemide while not eating/while vomiting -Watch fluid balance  Primary hypertension Patient is on Coreg 6.$RemoveBe'25mg'RSCmDUzkv$  BID as an outpatient  Hyperlipidemia Patient is on Lipitor.   DVT prophylaxis: Lovenox Code Status:   Code Status: Full Code Family Communication: None at bedside Disposition Plan: Discharge home likely in several days pending improvement of nausea/vomiting/ability to tolerate oral diet   Consultants:   None  Procedures:   None  Antimicrobials:  None    Subjective: Nausea/vomiting all night. Feels  worse than on admission. Abdominal pain which is generalized.  Objective: Vitals:   08/09/20 0430 08/09/20 0516 08/09/20 0702 08/09/20 0842  BP: (!) 160/80 (!) 175/96 (!) 160/90 (!) 162/91  Pulse: 88 96 90 99  Resp: $Remo'18 18 18 14  'FAcnq$ Temp: 98.8 F (37.1 C) 98.8 F (37.1 C) 98.8 F (37.1 C) 98.9 F (37.2 C)  TempSrc:  Axillary Oral Oral  SpO2: 95% 95%  98%  Weight:      Height:        Intake/Output Summary (Last 24 hours) at 08/09/2020 1041 Last data filed at 08/09/2020 0129 Gross per 24 hour  Intake 2000 ml  Output 800 ml  Net 1200 ml   Filed Weights   08/09/20 0049  Weight: 82.3 kg    Examination:  General exam: Appears calm and uncomfortable. Acutely ill appearing. Respiratory system: Decreased breath sounds. Respiratory effort normal. Cardiovascular system: S1 & S2 heard, RRR. No murmurs, rubs, gallops or clicks. Gastrointestinal system: Abdomen is nondistended, soft and mildly tender. No organomegaly or masses felt. Decreased bowel sounds heard. Central nervous system: Alert and oriented. No focal neurological deficits. Musculoskeletal: No edema. No calf tenderness Skin: No cyanosis. No rashes Psychiatry: Judgement and insight appear normal. Blunt affect    Data Reviewed: I have personally reviewed following labs and imaging studies  CBC Lab Results  Component Value Date   WBC 6.3 08/09/2020   RBC 3.33 (L) 08/09/2020   HGB 9.8 (L) 08/09/2020   HCT 29.7 (L) 08/09/2020   MCV 89.2 08/09/2020   MCH 29.4 08/09/2020   PLT 237 08/09/2020   MCHC 33.0 08/09/2020   RDW 12.5 08/09/2020   LYMPHSABS 1.6 06/08/2020   MONOABS 0.3 06/08/2020   EOSABS  0.1 06/08/2020   BASOSABS 0.0 78/24/2353     Last metabolic panel Lab Results  Component Value Date   NA 142 08/09/2020   K 3.9 08/09/2020   CL 109 08/09/2020   CO2 24 08/09/2020   BUN 24 (H) 08/09/2020   CREATININE 1.22 (H) 08/09/2020   GLUCOSE 292 (H) 08/09/2020   GFRNONAA 54 (L) 08/09/2020   GFRAA 102 01/31/2020    CALCIUM 7.7 (L) 08/09/2020   PHOS 3.3 10/13/2019   PROT 4.6 (L) 08/09/2020   ALBUMIN 2.0 (L) 08/09/2020   LABGLOB 2.5 11/24/2019   AGRATIO 1.2 11/24/2019   BILITOT 0.7 08/09/2020   ALKPHOS 50 08/09/2020   AST 14 (L) 08/09/2020   ALT 15 08/09/2020   ANIONGAP 9 08/09/2020    CBG (last 3)  Recent Labs    08/08/20 1946 08/09/20 0110 08/09/20 0758  GLUCAP 277* 310* 267*     GFR: Estimated Creatinine Clearance: 63.7 mL/min (A) (by C-G formula based on SCr of 1.22 mg/dL (H)).  Coagulation Profile: No results for input(s): INR, PROTIME in the last 168 hours.  No results found for this or any previous visit (from the past 240 hour(s)).      Radiology Studies: CT Abdomen Pelvis W Contrast  Result Date: 08/08/2020 CLINICAL DATA:  Abdominal pain. EXAM: CT ABDOMEN AND PELVIS WITH CONTRAST TECHNIQUE: Multidetector CT imaging of the abdomen and pelvis was performed using the standard protocol following bolus administration of intravenous contrast. CONTRAST:  156mL OMNIPAQUE IOHEXOL 300 MG/ML  SOLN COMPARISON:  October 11, 2019 FINDINGS: Lower chest: No acute abnormality. Hepatobiliary: No focal liver abnormality is seen. No gallstones, gallbladder wall thickening, or biliary dilatation. Pancreas: Unremarkable. No pancreatic ductal dilatation or surrounding inflammatory changes. Spleen: Normal in size without focal abnormality. Adrenals/Urinary Tract: Adrenal glands are unremarkable. Kidneys are normal, without renal calculi, focal lesion, or hydronephrosis. Bladder is unremarkable. Stomach/Bowel: There is a small, stable hiatal hernia. Mild diffuse distal esophageal wall thickening is noted. This represents a new finding when compared to the prior study. Appendix appears normal. No evidence of bowel dilatation. Noninflamed diverticula are seen throughout the sigmoid colon. Vascular/Lymphatic: No significant vascular findings are present. No enlarged abdominal or pelvic lymph nodes. Reproductive:  A predominantly stable 2.1 cm x 2.3 cm mildly hyperdense soft tissue mass is seen within the uterus on the right. This is present on the prior exam. Other: There is a stable 2.1 cm x 1.5 cm fat containing umbilical hernia. A trace amount of posterior pelvic free fluid is noted. Musculoskeletal: No acute or significant osseous findings. IMPRESSION: 1. Small, stable hiatal hernia with additional findings which may represent mild esophagitis. 2. Sigmoid diverticulosis. 3. Stable hyperdense uterine fibroid. 4. Trace amount of posterior pelvic free fluid, likely physiologic. Electronically Signed   By: Virgina Norfolk M.D.   On: 08/08/2020 22:09        Scheduled Meds: . enoxaparin (LOVENOX) injection  40 mg Subcutaneous Q24H  . insulin aspart  0-20 Units Subcutaneous TID WC  . insulin aspart  0-5 Units Subcutaneous QHS  . ondansetron (ZOFRAN) IV  4 mg Intravenous Q6H   Continuous Infusions: . lactated ringers 125 mL/hr at 08/09/20 0129     LOS: 0 days     Cordelia Poche, MD [facility] 08/09/2020, 10:41 AM  If 7PM-7AM, please contact night-coverage [email or web address]

## 2020-08-09 NOTE — Progress Notes (Signed)
   08/09/20 0049  Assess: MEWS Score  Temp 98.6 F (37 C)  BP (!) 167/101  Pulse Rate (!) 111  Resp 18  Level of Consciousness Alert  SpO2 98 %  O2 Device Room Air  Patient Activity (if Appropriate) In bed  Assess: if the MEWS score is Yellow or Red  Were vital signs taken at a resting state? Yes  Focused Assessment No change from prior assessment  Early Detection of Sepsis Score *See Row Information* Low  MEWS guidelines implemented *See Row Information* Yes  Treat  Pain Scale 0-10  Pain Score 7  Pain Type Acute pain  Pain Location Abdomen  Pain Orientation Left;Right;Mid  Pain Frequency Occasional  Pain Onset Progressive  Patients Stated Pain Goal 2  Pain Intervention(s) Repositioned;Medication (See eMAR)  Complains of Nausea /  Vomiting  Nausea relieved by Antiemetic  Patients response to intervention Effective  Take Vital Signs  Increase Vital Sign Frequency  Yellow: Q 2hr X 2 then Q 4hr X 2, if remains yellow, continue Q 4hrs  Escalate  MEWS: Escalate Yellow: discuss with charge nurse/RN and consider discussing with provider and RRT  Notify: Charge Nurse/RN  Name of Charge Nurse/RN Notified Stephanie, RN  Date Charge Nurse/RN Notified 08/09/20  Time Charge Nurse/RN Notified 0050  Notify: Provider  Provider Name/Title B. Randol Kern, NP  Date Provider Notified 08/09/20  Time Provider Notified [phone]  Notification Type Page  Notification Reason Change in status  Response See new orders  Date of Provider Response 08/09/20  Time of Provider Response [phone]  Document  Patient Outcome Stabilized after interventions  Progress note created (see row info) Yes

## 2020-08-10 DIAGNOSIS — R933 Abnormal findings on diagnostic imaging of other parts of digestive tract: Secondary | ICD-10-CM

## 2020-08-10 DIAGNOSIS — D649 Anemia, unspecified: Secondary | ICD-10-CM

## 2020-08-10 LAB — GLUCOSE, CAPILLARY
Glucose-Capillary: 124 mg/dL — ABNORMAL HIGH (ref 70–99)
Glucose-Capillary: 143 mg/dL — ABNORMAL HIGH (ref 70–99)
Glucose-Capillary: 150 mg/dL — ABNORMAL HIGH (ref 70–99)
Glucose-Capillary: 163 mg/dL — ABNORMAL HIGH (ref 70–99)
Glucose-Capillary: 168 mg/dL — ABNORMAL HIGH (ref 70–99)
Glucose-Capillary: 172 mg/dL — ABNORMAL HIGH (ref 70–99)
Glucose-Capillary: 93 mg/dL (ref 70–99)

## 2020-08-10 LAB — BASIC METABOLIC PANEL
Anion gap: 8 (ref 5–15)
BUN: 19 mg/dL (ref 6–20)
CO2: 25 mmol/L (ref 22–32)
Calcium: 8.3 mg/dL — ABNORMAL LOW (ref 8.9–10.3)
Chloride: 110 mmol/L (ref 98–111)
Creatinine, Ser: 1.2 mg/dL — ABNORMAL HIGH (ref 0.44–1.00)
GFR, Estimated: 55 mL/min — ABNORMAL LOW (ref >60)
Glucose, Bld: 195 mg/dL — ABNORMAL HIGH (ref 70–99)
Potassium: 3.6 mmol/L (ref 3.5–5.1)
Sodium: 143 mmol/L (ref 135–145)

## 2020-08-10 MED ORDER — HYDRALAZINE HCL 20 MG/ML IJ SOLN: 5.0000 mg | INTRAMUSCULAR | Status: DC | PRN

## 2020-08-10 MED ORDER — PROMETHAZINE HCL 25 MG/ML IJ SOLN
12.5000 mg | Freq: Four times a day (QID) | INTRAMUSCULAR | Status: DC
  Administered 2020-08-10 – 2020-08-16 (×21): 12.5 mg via INTRAVENOUS
  Filled 2020-08-10 (×22): qty 1

## 2020-08-10 MED ORDER — PANTOPRAZOLE SODIUM 40 MG IV SOLR
40.0000 mg | Freq: Two times a day (BID) | INTRAVENOUS | Status: DC
  Administered 2020-08-10 – 2020-08-14 (×9): 40 mg via INTRAVENOUS
  Filled 2020-08-10 (×10): qty 40

## 2020-08-10 MED ORDER — CARVEDILOL 6.25 MG PO TABS
6.2500 mg | ORAL_TABLET | Freq: Two times a day (BID) | ORAL | Status: DC
  Administered 2020-08-10 – 2020-08-16 (×12): 6.25 mg via ORAL
  Filled 2020-08-10 (×12): qty 1

## 2020-08-10 MED ORDER — ENOXAPARIN SODIUM 40 MG/0.4ML ~~LOC~~ SOLN: 40.0000 mg | SUBCUTANEOUS | Status: DC

## 2020-08-10 NOTE — Progress Notes (Signed)
PROGRESS NOTE    [name]  HWT:[id number] DOB: 05-26-1971 DOA: 08/08/2020 PCP: [name], PA-C   Brief Narrative: [name] is a 50 y.o. female with a history of diabetes type 1 on insulin pump, diastolic heart failure, foot ulcer, diabetic neuropathy, migraine headaches. Patient presented secondary nausea and vomiting requiring IV antiemetics.   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 Previous workup for gastroparesis in 2020 significant for a normal gastric emptying study. No recent illness symptoms. No associated diarrhea. Patient with persistent nausea with vomiting and retching.  -Continue IV fluids while unable to take PO -Clear liquids as able -Scheduled Zofran IV; continue phenergan prn for refractory symptoms -GI consult  Diabetes mellitus, type 1 Patient manages with an insulin pump for which stopped working two days prior. No evidence of DKA on admission labs. Allergy to Lantus listed. -Levemir 10 units daily whille not taking oral nutrition -SSI q4 hours while not taking oral nutrition  Chronic diastolic heart failure Patient is on torsemide as an outpatient -Hold torsemide while not eating/while vomiting -Watch fluid balance  Primary hypertension Patient is on Coreg 6.$RemoveBe'25mg'SlQtuMPdB$  BID as an outpatient  Hyperlipidemia Patient is on Lipitor.   DVT prophylaxis: Lovenox Code Status:   Code Status: Full Code Family Communication: None at bedside Disposition Plan: Discharge home likely in several days pending improvement of nausea/vomiting/ability to tolerate oral diet in addition to GI recommendations   Consultants:   Gastroenterology  Procedures:    None  Antimicrobials:  None    Subjective: Continues to have nausea and vomiting. Continued abdominal pain in addition to back and chest pain. Feels very poor.   Objective: Vitals:   08/09/20 0842 08/09/20 1245 08/09/20 2137 08/10/20 0411  BP: (!) 162/91 (!) 147/88 (!) 160/103 (!) 178/101  Pulse: 99 94 (!) 106 98  Resp: $Remo'14 15 20 20  'jIQyQ$ Temp: 98.9 F (37.2 C) 98.4 F (36.9 C) 97.7 F (36.5 C) 98.8 F (37.1 C)  TempSrc: Oral Oral Oral Oral  SpO2: 98% 96% 98% 98%  Weight:      Height:        Intake/Output Summary (Last 24 hours) at 08/10/2020 1143 Last data filed at 08/10/2020 0631 Gross per 24 hour  Intake 3399.64 ml  Output 800 ml  Net 2599.64 ml   Filed Weights   08/09/20 0049  Weight: 82.3 kg    Examination:  General exam: Appears calm and uncomfortable Respiratory system: Clear to auscultation. Respiratory effort normal. Cardiovascular system: S1 & S2 heard, RRR. No murmurs, rubs, gallops or clicks. Gastrointestinal system: Abdomen is nondistended, soft and mildly tender. Normal bowel sounds heard. Central nervous system: Alert and oriented. No focal neurological deficits. Musculoskeletal: No edema. No calf tenderness Skin: No cyanosis. No rashes  Data Reviewed: I have personally reviewed following labs and imaging studies  CBC Lab Results  Component Value Date   WBC 6.3 08/09/2020   RBC 3.33 (L) 08/09/2020   HGB 9.8 (L) 08/09/2020   HCT 29.7 (L) 08/09/2020   MCV 89.2 08/09/2020   MCH 29.4 08/09/2020   PLT 237 08/09/2020   MCHC 33.0 08/09/2020   RDW 12.5 08/09/2020   LYMPHSABS 1.6 06/08/2020   MONOABS 0.3 06/08/2020   EOSABS 0.1 06/08/2020  BASOSABS 0.0 22/63/3354     Last metabolic panel Lab Results  Component Value Date   NA 143 08/10/2020   K 3.6 08/10/2020   CL 110 08/10/2020   CO2 25 08/10/2020   BUN 19 08/10/2020   CREATININE 1.20 (H) 08/10/2020   GLUCOSE 195 (H) 08/10/2020   GFRNONAA 55 (L) 08/10/2020   GFRAA 102 01/31/2020    CALCIUM 8.3 (L) 08/10/2020   PHOS 3.3 10/13/2019   PROT 4.6 (L) 08/09/2020   ALBUMIN 2.0 (L) 08/09/2020   LABGLOB 2.5 11/24/2019   AGRATIO 1.2 11/24/2019   BILITOT 0.7 08/09/2020   ALKPHOS 50 08/09/2020   AST 14 (L) 08/09/2020   ALT 15 08/09/2020   ANIONGAP 8 08/10/2020    CBG (last 3)  Recent Labs    08/09/20 2116 08/10/20 0414 08/10/20 0737  GLUCAP 147* 150* 163*     GFR: Estimated Creatinine Clearance: 64.7 mL/min (A) (by C-G formula based on SCr of 1.2 mg/dL (H)).  Coagulation Profile: No results for input(s): INR, PROTIME in the last 168 hours.  No results found for this or any previous visit (from the past 240 hour(s)).      Radiology Studies: CT Abdomen Pelvis W Contrast  Result Date: 08/08/2020 CLINICAL DATA:  Abdominal pain. EXAM: CT ABDOMEN AND PELVIS WITH CONTRAST TECHNIQUE: Multidetector CT imaging of the abdomen and pelvis was performed using the standard protocol following bolus administration of intravenous contrast. CONTRAST:  121mL OMNIPAQUE IOHEXOL 300 MG/ML  SOLN COMPARISON:  October 11, 2019 FINDINGS: Lower chest: No acute abnormality. Hepatobiliary: No focal liver abnormality is seen. No gallstones, gallbladder wall thickening, or biliary dilatation. Pancreas: Unremarkable. No pancreatic ductal dilatation or surrounding inflammatory changes. Spleen: Normal in size without focal abnormality. Adrenals/Urinary Tract: Adrenal glands are unremarkable. Kidneys are normal, without renal calculi, focal lesion, or hydronephrosis. Bladder is unremarkable. Stomach/Bowel: There is a small, stable hiatal hernia. Mild diffuse distal esophageal wall thickening is noted. This represents a new finding when compared to the prior study. Appendix appears normal. No evidence of bowel dilatation. Noninflamed diverticula are seen throughout the sigmoid colon. Vascular/Lymphatic: No significant vascular findings are present. No enlarged abdominal or pelvic lymph nodes. Reproductive: A  predominantly stable 2.1 cm x 2.3 cm mildly hyperdense soft tissue mass is seen within the uterus on the right. This is present on the prior exam. Other: There is a stable 2.1 cm x 1.5 cm fat containing umbilical hernia. A trace amount of posterior pelvic free fluid is noted. Musculoskeletal: No acute or significant osseous findings. IMPRESSION: 1. Small, stable hiatal hernia with additional findings which may represent mild esophagitis. 2. Sigmoid diverticulosis. 3. Stable hyperdense uterine fibroid. 4. Trace amount of posterior pelvic free fluid, likely physiologic. Electronically Signed   By: Virgina Norfolk M.D.   On: 08/08/2020 22:09        Scheduled Meds: . carvedilol  6.25 mg Oral BID WC  . enoxaparin (LOVENOX) injection  40 mg Subcutaneous Q24H  . insulin aspart  0-15 Units Subcutaneous Q4H  . insulin detemir  10 Units Subcutaneous Daily  . ondansetron (ZOFRAN) IV  4 mg Intravenous Q6H   Continuous Infusions: . lactated ringers 125 mL/hr at 08/10/20 0344     LOS: 1 day     Cordelia Poche, MD [facility] 08/10/2020, 11:43 AM  If 7PM-7AM, please contact night-coverage [email or web address]

## 2020-08-10 NOTE — H&P (View-Only) (Signed)
Consultation  Referring Provider: Dr. Lonny Prude     Primary Care Physician:  [name], PA-C Primary Gastroenterologist:  Dr. Loletha Carrow      Reason for Consultation:   Intractable nausea and vomiting           HPI:   [name] is a 50 y.o. female with a past medical history as listed below including CHF and anxiety as well as diabetes, who presented to the ER on 08/08/2020 with nausea and vomiting.    Initially, she presented to the ER because her insulin pump needle came out and she could not get her insulin, she had significant hyperglycemia with a blood closer to 300s but a normal anion gap and no DKA.  She was treated in the ER symptomatically but continued to vomit.  Associated symptoms included some weakness and signs of dehydration.    Today, the patient tells me the last time she is able to get in any food was 08/04/2020.  Since then she started with intractable nausea and vomiting tell me that she cannot even keep in water ice chips because these come back up every 15 to 20 minutes.  She has not had a bowel movement over that timeframe either but is passing some gas.  Describes generalized abdominal discomfort but worse in her upper quadrants which radiates through to her left shoulder blade.  She is very uncomfortable at time of my exam today.  Does explain that she has had a history of nausea and vomiting but "never like this".    When asked about previous EGD and colonoscopy she told me these were held off because she was having a cardiac work-up.  This turned out to be okay.    Denies fever, chills or blood in her stool.  ED course: Blood pressure 170/99, blood sugar 320, total protein 6.1, urinalysis with glucosuria and no other significant findings, CT abdomen pelvis showed no significant findings except for sigmoid diverticulosis, hemoglobin A1c yesterday was 7.5  GI history: 11/19/2019 office visit with Alonza Bogus for nausea and vomiting.  At that time reported  intermittent episodes of vomiting for the past 6 to 8 months and a 30 to 60 pound weight loss; plan: EGD and colonoscopy, pantoprazole increased to 40 mg twice daily also given MiraLAX twice daily and a 2-day bowel prep due to constipation issues, last hemoglobin A1c 14.4 at that time also had a normocytic anemia with normal iron studies (it does not appear patient ever had procedures) August 2020 gastric emptying study: Normal  Past Medical History:  Diagnosis Date  . Anemia   . Anxiety   . Arthritis   . Asthma   . CHF (congestive heart failure) ([place])   . Depression   . History of chicken pox   . Migraines   . Mood disorder ([facility])    anxiety  . Prurigo nodularis    with diabetic dermopathy  . Type 1 diabetes, uncontrolled, with neuropathy ([place])    [place]    Past Surgical History:  Procedure Laterality Date  . treadmill stress test  01/2013   WNL, low risk study    Family History  Problem Relation Age of Onset  . Diabetes Mother        type 2  . Hypertension Mother   . Thyroid disease Mother   . Bipolar disorder Mother   . Heart disease Mother   . Calcium disorder Mother   . Cancer Maternal Grandmother  Breast, stomach  . Cancer Paternal Grandmother        stomach, lung (smoker)  . Diabetes Paternal Grandmother   . Diabetes Paternal Grandfather   . Heart failure Sister   . Diabetes Sister   . Stroke Maternal Aunt   . Cancer Maternal Uncle        prostate  . CAD Maternal Aunt        stents  . Cancer Maternal Aunt 64       ovarian     Social History   Tobacco Use  . Smoking status: Never Smoker  . Smokeless tobacco: Never Used  Vaping Use  . Vaping Use: Never used  Substance Use Topics  . Alcohol use: Not Currently  . Drug use: No    Prior to Admission medications   Medication Sig Start Date End Date Taking? Authorizing Provider  albuterol (PROAIR HFA) 108 (90 Base) MCG/ACT inhaler Inhale 2 puffs into the lungs every 6 (six) hours as needed for  wheezing or shortness of breath. 07/03/20  Yes [name], PA-C  atorvastatin (LIPITOR) 10 MG tablet Take 10 mg by mouth daily.   Yes [provider]  Betamethasone Valerate 0.12 % foam Apply thin layer topically twice daily to areas of hair loss. 04/17/20  Yes [name], PA-C  carvedilol (COREG) 6.25 MG tablet Take 1 tablet (6.25 mg total) by mouth 2 (two) times daily. 06/26/20  Yes [name], MD  hydrOXYzine (ATARAX/VISTARIL) 25 MG tablet Take 1 tablet (25 mg total) by mouth 3 (three) times daily as needed. Patient taking differently: Take 25 mg by mouth 3 (three) times daily as needed for anxiety or itching. 04/17/20  Yes [name], PA-C  Insulin Disposable Pump (OMNIPOD 10 PACK) MISC by Does not apply route.   Yes [provider]  LORazepam (ATIVAN) 0.5 MG tablet TAKE 1 TABLET BY MOUTH EVERY 8 HOURS AS NEEDED FOR ANXIETY. Patient taking differently: Take 0.5 mg by mouth every 8 (eight) hours as needed for anxiety. 05/22/20  Yes [name], PA-C  NOVOLOG 100 UNIT/ML injection SMARTSIG:0-120 Unit(s) SUB-Q Daily 04/06/20  Yes [provider]  ondansetron (ZOFRAN ODT) 4 MG disintegrating tablet Take 1 tablet (4 mg total) by mouth every 8 (eight) hours as needed for nausea or vomiting. 06/12/20  Yes Truddie Hidden, MD  torsemide (DEMADEX) 20 MG tablet TAKE 1 TABLET (20 MG TOTAL) BY MOUTH EVERY MONDAY, WEDNESDAY, AND FRIDAY. 06/19/20  Yes [name], MD  Continuous Blood Gluc Sensor (FREESTYLE LIBRE 2 SENSOR) MISC CHANGE EVERY 14 DAYS 03/20/20   [provider]    Current Facility-Administered Medications  Medication Dose Route Frequency Provider Last Rate Last Admin  . carvedilol (COREG) tablet 6.25 mg  6.25 mg Oral BID WC Mariel Aloe, MD      . enoxaparin (LOVENOX) injection 40 mg  40 mg Subcutaneous Q24H Gala Romney L, MD   40 mg at 08/10/20 1023  . insulin aspart (novoLOG) injection 0-15 Units  0-15 Units  Subcutaneous Q4H Mariel Aloe, MD   3 Units at 08/10/20 0750  . insulin detemir (LEVEMIR) injection 10 Units  10 Units Subcutaneous Daily Mariel Aloe, MD   10 Units at 08/10/20 1024  . lactated ringers infusion   Intravenous Continuous Elwyn Reach, MD 125 mL/hr at 08/10/20 1149 New Bag at 08/10/20 1149  . morphine 2 MG/ML injection 2 mg  2 mg Intravenous Q2H PRN Elwyn Reach, MD  2 mg at 08/10/20 0755  . ondansetron (ZOFRAN) injection 4 mg  4 mg Intravenous Q6H Mariel Aloe, MD   4 mg at 08/10/20 1149  . promethazine (PHENERGAN) injection 12.5 mg  12.5 mg Intravenous Q6H PRN Mariel Aloe, MD   12.5 mg at 08/10/20 0744    Allergies as of 08/08/2020 - Review Complete 08/08/2020  Allergen Reaction Noted  . Amoxicillin Itching 11/25/2017  . Dulaglutide  05/23/2020  . Miconazole nitrate  05/23/2020  . Lantus [insulin glargine] Itching and Rash 11/25/2017     Review of Systems:    Constitutional: No fever or chills Skin: No rash Cardiovascular: No chest pain  Respiratory: No SOB  Gastrointestinal: See HPI and otherwise negative Genitourinary: No dysuria  Neurological: No headache, dizziness or syncope Musculoskeletal: No new muscle or joint pain Hematologic: No bleeding  Psychiatric: No history of depression or anxiety    Physical Exam:  Vital signs in last 24 hours: Temp:  [97.7 F (36.5 C)-98.8 F (37.1 C)] 98.8 F (37.1 C) (01/06 0411) Pulse Rate:  [94-106] 98 (01/06 0411) Resp:  [15-20] 20 (01/06 0411) BP: (147-178)/(88-103) 178/101 (01/06 0411) SpO2:  [96 %-98 %] 98 % (01/06 0411) Last BM Date: 08/03/20 General:   Uncomfortable appearing AA female, Well developed, Well nourished, alert and cooperative, gagging and holding an emesis bag to her mouth spitting up a white foam Head:  Normocephalic and atraumatic. Eyes:   PEERL, EOMI. No icterus. Conjunctiva pink. Ears:  Normal auditory acuity. Neck:  Supple Throat: Oral cavity and pharynx without  inflammation, swelling or lesion.  Lungs: Respirations even and unlabored. Lungs clear to auscultation bilaterally.   No wheezes, crackles, or rhonchi.  Heart: Normal S1, S2. No MRG. Regular rate and rhythm. No peripheral edema, cyanosis or pallor.  Abdomen:  Soft, nondistended, mild generalized TTP, worse in the upper quadrants. No rebound or guarding. Normal bowel sounds. No appreciable masses or hepatomegaly. Rectal:  Not performed.  Msk:  Symmetrical without gross deformities. Peripheral pulses intact.  Extremities:  Without edema, no deformity or joint abnormality. Neurologic:  Alert and  oriented x4;  grossly normal neurologically.  Skin:   Dry and intact without significant lesions or rashes. Psychiatric: Demonstrates good judgement and reason without abnormal affect or behaviors.   LAB RESULTS: Recent Labs    08/08/20 1535 08/09/20 0148 08/09/20 0617  WBC 9.7 6.6 6.3  HGB 11.8* 10.3* 9.8*  HCT 34.8* 30.6* 29.7*  PLT 258 250 237   BMET Recent Labs    08/08/20 1535 08/09/20 0148 08/09/20 0617 08/10/20 0800  NA 140  --  142 143  K 3.9  --  3.9 3.6  CL 103  --  109 110  CO2 26  --  24 25  GLUCOSE 320*  --  292* 195*  BUN 23*  --  24* 19  CREATININE 0.93 1.25* 1.22* 1.20*  CALCIUM 8.7*  --  7.7* 8.3*   LFT Recent Labs    08/09/20 0617  PROT 4.6*  ALBUMIN 2.0*  AST 14*  ALT 15  ALKPHOS 50  BILITOT 0.7   STUDIES: CT Abdomen Pelvis W Contrast  Result Date: 08/08/2020 CLINICAL DATA:  Abdominal pain. EXAM: CT ABDOMEN AND PELVIS WITH CONTRAST TECHNIQUE: Multidetector CT imaging of the abdomen and pelvis was performed using the standard protocol following bolus administration of intravenous contrast. CONTRAST:  167mL OMNIPAQUE IOHEXOL 300 MG/ML  SOLN COMPARISON:  October 11, 2019 FINDINGS: Lower chest: No acute abnormality. Hepatobiliary: No focal liver  abnormality is seen. No gallstones, gallbladder wall thickening, or biliary dilatation. Pancreas: Unremarkable. No  pancreatic ductal dilatation or surrounding inflammatory changes. Spleen: Normal in size without focal abnormality. Adrenals/Urinary Tract: Adrenal glands are unremarkable. Kidneys are normal, without renal calculi, focal lesion, or hydronephrosis. Bladder is unremarkable. Stomach/Bowel: There is a small, stable hiatal hernia. Mild diffuse distal esophageal wall thickening is noted. This represents a new finding when compared to the prior study. Appendix appears normal. No evidence of bowel dilatation. Noninflamed diverticula are seen throughout the sigmoid colon. Vascular/Lymphatic: No significant vascular findings are present. No enlarged abdominal or pelvic lymph nodes. Reproductive: A predominantly stable 2.1 cm x 2.3 cm mildly hyperdense soft tissue mass is seen within the uterus on the right. This is present on the prior exam. Other: There is a stable 2.1 cm x 1.5 cm fat containing umbilical hernia. A trace amount of posterior pelvic free fluid is noted. Musculoskeletal: No acute or significant osseous findings. IMPRESSION: 1. Small, stable hiatal hernia with additional findings which may represent mild esophagitis. 2. Sigmoid diverticulosis. 3. Stable hyperdense uterine fibroid. 4. Trace amount of posterior pelvic free fluid, likely physiologic. Electronically Signed   By: Virgina Norfolk M.D.   On: 08/08/2020 22:09     Impression / Plan:   Impression: 1.  Intractable nausea and vomiting: Started on 08/05/2020, patient tells me she has not been able to hold in anything including liquids since that time, has constant nausea with vomiting every 15 to 20 minutes, this did correspond with an elevated glucose due to the fact that her insulin pump needle came out, history of the same with work-up for gastroparesis in 2020 which was negative; consider relation to hyperglycemia versus other 2.  Anemia: Seen in outpatient clinic for this in April, colonoscopy and Endo were recommended but patient was having  cardiac work-up and these procedures were delayed 3.  Type 1 diabetes: Uncontrolled  Plan: 1.  Scheduled the patient for an EGD tomorrow with Dr. Fuller Plan.  Did discuss risks, benefits, limitations and alternatives and patient agrees to proceed. 2.  Placed an order for Covid testing as this had not been done previously. 3.  Patient can continue on clears today as tolerated and n.p.o. after midnight. 4.  Agree with scheduled antiemetics. 5.  Patient would likely benefit from a colonoscopy at some point in the future as well, due to her anemia but also screening due to her age, this will need to be set up as an outpatient, she could not tolerate prep. 6.  Please await further recommendations from Dr. Fuller Plan later today.  Thank you for your kind consultation, we will continue to follow.  [name]  08/10/2020, 12:12 PM      Attending Physician Note   I have taken a history, examined the patient and reviewed the chart. I agree with the Advanced Practitioner's note, impression and recommendations.  Impression: * Intractable nausea and vomiting for 5 days, etiology not clear. Recent symptoms associated with hyperglycemia. Intermittent nausea, vomiting for months, etiology not clear. Gastric emptying scan in August 2020 was normal. * Small hiatal hernia and mild diffuse esophageal wall thickening on CT AP * Normocytic anemia * DM1, uncontrolled since off insulin pump. This could be causing or exacerbating her N/V  Recommendation: * Optimize blood glucose control, per primary service * Scheduled Zofran IV and Phenergan IV for now * Pantoprazole 40 mg IV q12h * SARS CoV-2 testing today for EGD tomorrow  * EGD tomorrow * Outpatient  colonoscopy in the near future. Currently she cannot tolerate a bowel prep.     Lucio Edward, MD [facility]

## 2020-08-10 NOTE — Consult Note (Addendum)
Consultation  Referring Provider: Dr. Lonny Prude     Primary Care Physician:  [name], PA-C Primary Gastroenterologist:  Dr. Loletha Carrow      Reason for Consultation:   Intractable nausea and vomiting           HPI:   [name] is a 50 y.o. female with a past medical history as listed below including CHF and anxiety as well as diabetes, who presented to the ER on 08/08/2020 with nausea and vomiting.    Initially, she presented to the ER because her insulin pump needle came out and she could not get her insulin, she had significant hyperglycemia with a blood closer to 300s but a normal anion gap and no DKA.  She was treated in the ER symptomatically but continued to vomit.  Associated symptoms included some weakness and signs of dehydration.    Today, the patient tells me the last time she is able to get in any food was 08/04/2020.  Since then she started with intractable nausea and vomiting tell me that she cannot even keep in water ice chips because these come back up every 15 to 20 minutes.  She has not had a bowel movement over that timeframe either but is passing some gas.  Describes generalized abdominal discomfort but worse in her upper quadrants which radiates through to her left shoulder blade.  She is very uncomfortable at time of my exam today.  Does explain that she has had a history of nausea and vomiting but "never like this".    When asked about previous EGD and colonoscopy she told me these were held off because she was having a cardiac work-up.  This turned out to be okay.    Denies fever, chills or blood in her stool.  ED course: Blood pressure 170/99, blood sugar 320, total protein 6.1, urinalysis with glucosuria and no other significant findings, CT abdomen pelvis showed no significant findings except for sigmoid diverticulosis, hemoglobin A1c yesterday was 7.5  GI history: 11/19/2019 office visit with Alonza Bogus for nausea and vomiting.  At that time reported  intermittent episodes of vomiting for the past 6 to 8 months and a 30 to 60 pound weight loss; plan: EGD and colonoscopy, pantoprazole increased to 40 mg twice daily also given MiraLAX twice daily and a 2-day bowel prep due to constipation issues, last hemoglobin A1c 14.4 at that time also had a normocytic anemia with normal iron studies (it does not appear patient ever had procedures) August 2020 gastric emptying study: Normal  Past Medical History:  Diagnosis Date  . Anemia   . Anxiety   . Arthritis   . Asthma   . CHF (congestive heart failure) ([place])   . Depression   . History of chicken pox   . Migraines   . Mood disorder (HCC)    anxiety  . Prurigo nodularis    with diabetic dermopathy  . Type 1 diabetes, uncontrolled, with neuropathy ([place])    [place]    Past Surgical History:  Procedure Laterality Date  . treadmill stress test  01/2013   WNL, low risk study    Family History  Problem Relation Age of Onset  . Diabetes Mother        type 2  . Hypertension Mother   . Thyroid disease Mother   . Bipolar disorder Mother   . Heart disease Mother   . Calcium disorder Mother   . Cancer Maternal Grandmother  Breast, stomach  . Cancer Paternal Grandmother        stomach, lung (smoker)  . Diabetes Paternal Grandmother   . Diabetes Paternal Grandfather   . Heart failure Sister   . Diabetes Sister   . Stroke Maternal Aunt   . Cancer Maternal Uncle        prostate  . CAD Maternal Aunt        stents  . Cancer Maternal Aunt 64       ovarian     Social History   Tobacco Use  . Smoking status: Never Smoker  . Smokeless tobacco: Never Used  Vaping Use  . Vaping Use: Never used  Substance Use Topics  . Alcohol use: Not Currently  . Drug use: No    Prior to Admission medications   Medication Sig Start Date End Date Taking? Authorizing Provider  albuterol (PROAIR HFA) 108 (90 Base) MCG/ACT inhaler Inhale 2 puffs into the lungs every 6 (six) hours as needed for  wheezing or shortness of breath. 07/03/20  Yes [name], PA-C  atorvastatin (LIPITOR) 10 MG tablet Take 10 mg by mouth daily.   Yes [provider]  Betamethasone Valerate 0.12 % foam Apply thin layer topically twice daily to areas of hair loss. 04/17/20  Yes [name], PA-C  carvedilol (COREG) 6.25 MG tablet Take 1 tablet (6.25 mg total) by mouth 2 (two) times daily. 06/26/20  Yes [name], MD  hydrOXYzine (ATARAX/VISTARIL) 25 MG tablet Take 1 tablet (25 mg total) by mouth 3 (three) times daily as needed. Patient taking differently: Take 25 mg by mouth 3 (three) times daily as needed for anxiety or itching. 04/17/20  Yes [name], PA-C  Insulin Disposable Pump (OMNIPOD 10 PACK) MISC by Does not apply route.   Yes [provider]  LORazepam (ATIVAN) 0.5 MG tablet TAKE 1 TABLET BY MOUTH EVERY 8 HOURS AS NEEDED FOR ANXIETY. Patient taking differently: Take 0.5 mg by mouth every 8 (eight) hours as needed for anxiety. 05/22/20  Yes [name], PA-C  NOVOLOG 100 UNIT/ML injection SMARTSIG:0-120 Unit(s) SUB-Q Daily 04/06/20  Yes [provider]  ondansetron (ZOFRAN ODT) 4 MG disintegrating tablet Take 1 tablet (4 mg total) by mouth every 8 (eight) hours as needed for nausea or vomiting. 06/12/20  Yes Truddie Hidden, MD  torsemide (DEMADEX) 20 MG tablet TAKE 1 TABLET (20 MG TOTAL) BY MOUTH EVERY MONDAY, WEDNESDAY, AND FRIDAY. 06/19/20  Yes [name], MD  Continuous Blood Gluc Sensor (FREESTYLE LIBRE 2 SENSOR) MISC CHANGE EVERY 14 DAYS 03/20/20   [provider]    Current Facility-Administered Medications  Medication Dose Route Frequency Provider Last Rate Last Admin  . carvedilol (COREG) tablet 6.25 mg  6.25 mg Oral BID WC Mariel Aloe, MD      . enoxaparin (LOVENOX) injection 40 mg  40 mg Subcutaneous Q24H Gala Romney L, MD   40 mg at 08/10/20 1023  . insulin aspart (novoLOG) injection 0-15 Units  0-15 Units  Subcutaneous Q4H Mariel Aloe, MD   3 Units at 08/10/20 0750  . insulin detemir (LEVEMIR) injection 10 Units  10 Units Subcutaneous Daily Mariel Aloe, MD   10 Units at 08/10/20 1024  . lactated ringers infusion   Intravenous Continuous Elwyn Reach, MD 125 mL/hr at 08/10/20 1149 New Bag at 08/10/20 1149  . morphine 2 MG/ML injection 2 mg  2 mg Intravenous Q2H PRN Elwyn Reach, MD  2 mg at 08/10/20 0755  . ondansetron (ZOFRAN) injection 4 mg  4 mg Intravenous Q6H Mariel Aloe, MD   4 mg at 08/10/20 1149  . promethazine (PHENERGAN) injection 12.5 mg  12.5 mg Intravenous Q6H PRN Mariel Aloe, MD   12.5 mg at 08/10/20 0744    Allergies as of 08/08/2020 - Review Complete 08/08/2020  Allergen Reaction Noted  . Amoxicillin Itching 11/25/2017  . Dulaglutide  05/23/2020  . Miconazole nitrate  05/23/2020  . Lantus [insulin glargine] Itching and Rash 11/25/2017     Review of Systems:    Constitutional: No fever or chills Skin: No rash Cardiovascular: No chest pain  Respiratory: No SOB  Gastrointestinal: See HPI and otherwise negative Genitourinary: No dysuria  Neurological: No headache, dizziness or syncope Musculoskeletal: No new muscle or joint pain Hematologic: No bleeding  Psychiatric: No history of depression or anxiety    Physical Exam:  Vital signs in last 24 hours: Temp:  [97.7 F (36.5 C)-98.8 F (37.1 C)] 98.8 F (37.1 C) (01/06 0411) Pulse Rate:  [94-106] 98 (01/06 0411) Resp:  [15-20] 20 (01/06 0411) BP: (147-178)/(88-103) 178/101 (01/06 0411) SpO2:  [96 %-98 %] 98 % (01/06 0411) Last BM Date: 08/03/20 General:   Uncomfortable appearing AA female, Well developed, Well nourished, alert and cooperative, gagging and holding an emesis bag to her mouth spitting up a white foam Head:  Normocephalic and atraumatic. Eyes:   PEERL, EOMI. No icterus. Conjunctiva pink. Ears:  Normal auditory acuity. Neck:  Supple Throat: Oral cavity and pharynx without  inflammation, swelling or lesion.  Lungs: Respirations even and unlabored. Lungs clear to auscultation bilaterally.   No wheezes, crackles, or rhonchi.  Heart: Normal S1, S2. No MRG. Regular rate and rhythm. No peripheral edema, cyanosis or pallor.  Abdomen:  Soft, nondistended, mild generalized TTP, worse in the upper quadrants. No rebound or guarding. Normal bowel sounds. No appreciable masses or hepatomegaly. Rectal:  Not performed.  Msk:  Symmetrical without gross deformities. Peripheral pulses intact.  Extremities:  Without edema, no deformity or joint abnormality. Neurologic:  Alert and  oriented x4;  grossly normal neurologically.  Skin:   Dry and intact without significant lesions or rashes. Psychiatric: Demonstrates good judgement and reason without abnormal affect or behaviors.   LAB RESULTS: Recent Labs    08/08/20 1535 08/09/20 0148 08/09/20 0617  WBC 9.7 6.6 6.3  HGB 11.8* 10.3* 9.8*  HCT 34.8* 30.6* 29.7*  PLT 258 250 237   BMET Recent Labs    08/08/20 1535 08/09/20 0148 08/09/20 0617 08/10/20 0800  NA 140  --  142 143  K 3.9  --  3.9 3.6  CL 103  --  109 110  CO2 26  --  24 25  GLUCOSE 320*  --  292* 195*  BUN 23*  --  24* 19  CREATININE 0.93 1.25* 1.22* 1.20*  CALCIUM 8.7*  --  7.7* 8.3*   LFT Recent Labs    08/09/20 0617  PROT 4.6*  ALBUMIN 2.0*  AST 14*  ALT 15  ALKPHOS 50  BILITOT 0.7   STUDIES: CT Abdomen Pelvis W Contrast  Result Date: 08/08/2020 CLINICAL DATA:  Abdominal pain. EXAM: CT ABDOMEN AND PELVIS WITH CONTRAST TECHNIQUE: Multidetector CT imaging of the abdomen and pelvis was performed using the standard protocol following bolus administration of intravenous contrast. CONTRAST:  156mL OMNIPAQUE IOHEXOL 300 MG/ML  SOLN COMPARISON:  October 11, 2019 FINDINGS: Lower chest: No acute abnormality. Hepatobiliary: No focal liver  abnormality is seen. No gallstones, gallbladder wall thickening, or biliary dilatation. Pancreas: Unremarkable. No  pancreatic ductal dilatation or surrounding inflammatory changes. Spleen: Normal in size without focal abnormality. Adrenals/Urinary Tract: Adrenal glands are unremarkable. Kidneys are normal, without renal calculi, focal lesion, or hydronephrosis. Bladder is unremarkable. Stomach/Bowel: There is a small, stable hiatal hernia. Mild diffuse distal esophageal wall thickening is noted. This represents a new finding when compared to the prior study. Appendix appears normal. No evidence of bowel dilatation. Noninflamed diverticula are seen throughout the sigmoid colon. Vascular/Lymphatic: No significant vascular findings are present. No enlarged abdominal or pelvic lymph nodes. Reproductive: A predominantly stable 2.1 cm x 2.3 cm mildly hyperdense soft tissue mass is seen within the uterus on the right. This is present on the prior exam. Other: There is a stable 2.1 cm x 1.5 cm fat containing umbilical hernia. A trace amount of posterior pelvic free fluid is noted. Musculoskeletal: No acute or significant osseous findings. IMPRESSION: 1. Small, stable hiatal hernia with additional findings which may represent mild esophagitis. 2. Sigmoid diverticulosis. 3. Stable hyperdense uterine fibroid. 4. Trace amount of posterior pelvic free fluid, likely physiologic. Electronically Signed   By: Virgina Norfolk M.D.   On: 08/08/2020 22:09     Impression / Plan:   Impression: 1.  Intractable nausea and vomiting: Started on 08/05/2020, patient tells me she has not been able to hold in anything including liquids since that time, has constant nausea with vomiting every 15 to 20 minutes, this did correspond with an elevated glucose due to the fact that her insulin pump needle came out, history of the same with work-up for gastroparesis in 2020 which was negative; consider relation to hyperglycemia versus other 2.  Anemia: Seen in outpatient clinic for this in April, colonoscopy and Endo were recommended but patient was having  cardiac work-up and these procedures were delayed 3.  Type 1 diabetes: Uncontrolled  Plan: 1.  Scheduled the patient for an EGD tomorrow with Dr. Fuller Plan.  Did discuss risks, benefits, limitations and alternatives and patient agrees to proceed. 2.  Placed an order for Covid testing as this had not been done previously. 3.  Patient can continue on clears today as tolerated and n.p.o. after midnight. 4.  Agree with scheduled antiemetics. 5.  Patient would likely benefit from a colonoscopy at some point in the future as well, due to her anemia but also screening due to her age, this will need to be set up as an outpatient, she could not tolerate prep. 6.  Please await further recommendations from Dr. Fuller Plan later today.  Thank you for your kind consultation, we will continue to follow.  Lavone Nian Lemmon  08/10/2020, 12:12 PM      Attending Physician Note   I have taken a history, examined the patient and reviewed the chart. I agree with the Advanced Practitioner's note, impression and recommendations.  Impression: * Intractable nausea and vomiting for 5 days, etiology not clear. Recent symptoms associated with hyperglycemia. Intermittent nausea, vomiting for months, etiology not clear. Gastric emptying scan in August 2020 was normal. * Small hiatal hernia and mild diffuse esophageal wall thickening on CT AP * Normocytic anemia * DM1, uncontrolled since off insulin pump. This could be causing or exacerbating her N/V  Recommendation: * Optimize blood glucose control, per primary service * Scheduled Zofran IV and Phenergan IV for now * Pantoprazole 40 mg IV q12h * SARS CoV-2 testing today for EGD tomorrow  * EGD tomorrow * Outpatient  colonoscopy in the near future. Currently she cannot tolerate a bowel prep.     Lucio Edward, MD [facility]

## 2020-08-11 ENCOUNTER — Inpatient Hospital Stay (HOSPITAL_COMMUNITY): Payer: BC Managed Care – PPO | Admitting: Certified Registered"

## 2020-08-11 ENCOUNTER — Encounter (HOSPITAL_COMMUNITY)
Admission: EM | Disposition: A | Discharge status: Home / Self Care | Payer: Self-pay | Source: Home / Self Care | Attending: Family Medicine

## 2020-08-11 ENCOUNTER — Inpatient Hospital Stay (HOSPITAL_COMMUNITY): Payer: BC Managed Care – PPO

## 2020-08-11 ENCOUNTER — Encounter (HOSPITAL_COMMUNITY): Payer: Self-pay | Admitting: Internal Medicine

## 2020-08-11 DIAGNOSIS — K2100 Gastro-esophageal reflux disease with esophagitis, without bleeding: Secondary | ICD-10-CM

## 2020-08-11 DIAGNOSIS — R1084 Generalized abdominal pain: Secondary | ICD-10-CM

## 2020-08-11 DIAGNOSIS — K317 Polyp of stomach and duodenum: Secondary | ICD-10-CM

## 2020-08-11 HISTORY — PX: BIOPSY: SHX5522

## 2020-08-11 HISTORY — PX: ESOPHAGOGASTRODUODENOSCOPY (EGD) WITH PROPOFOL: SHX5813

## 2020-08-11 LAB — GLUCOSE, CAPILLARY
Glucose-Capillary: 126 mg/dL — ABNORMAL HIGH (ref 70–99)
Glucose-Capillary: 149 mg/dL — ABNORMAL HIGH (ref 70–99)
Glucose-Capillary: 158 mg/dL — ABNORMAL HIGH (ref 70–99)
Glucose-Capillary: 183 mg/dL — ABNORMAL HIGH (ref 70–99)
Glucose-Capillary: 198 mg/dL — ABNORMAL HIGH (ref 70–99)
Glucose-Capillary: 99 mg/dL (ref 70–99)

## 2020-08-11 LAB — TROPONIN I (HIGH SENSITIVITY): Troponin I (High Sensitivity): 16 ng/L (ref <18)

## 2020-08-11 LAB — SARS CORONAVIRUS 2 (TAT 6-24 HRS): SARS Coronavirus 2: NEGATIVE

## 2020-08-11 SURGERY — ESOPHAGOGASTRODUODENOSCOPY (EGD) WITH PROPOFOL
Anesthesia: General

## 2020-08-11 MED ORDER — FENTANYL CITRATE (PF) 250 MCG/5ML IJ SOLN
INTRAMUSCULAR | Status: DC | PRN
  Administered 2020-08-11: 50 ug via INTRAVENOUS

## 2020-08-11 MED ORDER — ONDANSETRON HCL 4 MG/2ML IJ SOLN
INTRAMUSCULAR | Status: DC | PRN
  Administered 2020-08-11: 4 mg via INTRAVENOUS

## 2020-08-11 MED ORDER — FENTANYL CITRATE (PF) 100 MCG/2ML IJ SOLN
INTRAMUSCULAR | Status: AC
  Filled 2020-08-11: qty 2

## 2020-08-11 MED ORDER — NITROGLYCERIN 0.4 MG SL SUBL
SUBLINGUAL_TABLET | SUBLINGUAL | Status: AC
  Administered 2020-08-11: 0.4 mg
  Filled 2020-08-11: qty 1

## 2020-08-11 MED ORDER — PROPOFOL 10 MG/ML IV BOLUS
INTRAVENOUS | Status: DC | PRN
  Administered 2020-08-11: 170 mg via INTRAVENOUS

## 2020-08-11 MED ORDER — PROPOFOL 500 MG/50ML IV EMUL
INTRAVENOUS | Status: DC | PRN
  Administered 2020-08-11: 125 ug/kg/min via INTRAVENOUS

## 2020-08-11 MED ORDER — LIDOCAINE 2% (20 MG/ML) 5 ML SYRINGE
INTRAMUSCULAR | Status: DC | PRN
  Administered 2020-08-11: 80 mg via INTRAVENOUS

## 2020-08-11 MED ORDER — DEXAMETHASONE SODIUM PHOSPHATE 10 MG/ML IJ SOLN
INTRAMUSCULAR | Status: DC | PRN
  Administered 2020-08-11: 4 mg via INTRAVENOUS

## 2020-08-11 MED ORDER — NITROGLYCERIN 0.4 MG SL SUBL
0.4000 mg | SUBLINGUAL_TABLET | SUBLINGUAL | Status: AC | PRN
  Administered 2020-08-11 – 2020-08-13 (×2): 0.4 mg via SUBLINGUAL
  Filled 2020-08-11: qty 1

## 2020-08-11 MED ORDER — MENTHOL 3 MG MT LOZG
1.0000 | LOZENGE | OROMUCOSAL | Status: DC | PRN
  Administered 2020-08-11: 3 mg via ORAL
  Filled 2020-08-11 (×2): qty 9

## 2020-08-11 MED ORDER — PHENYLEPHRINE 40 MCG/ML (10ML) SYRINGE FOR IV PUSH (FOR BLOOD PRESSURE SUPPORT)
PREFILLED_SYRINGE | INTRAVENOUS | Status: DC | PRN
  Administered 2020-08-11: 120 ug via INTRAVENOUS

## 2020-08-11 MED ORDER — PROPOFOL 10 MG/ML IV BOLUS
INTRAVENOUS | Status: AC
  Filled 2020-08-11: qty 20

## 2020-08-11 MED ORDER — SUCCINYLCHOLINE CHLORIDE 200 MG/10ML IV SOSY
PREFILLED_SYRINGE | INTRAVENOUS | Status: DC | PRN
  Administered 2020-08-11: 120 mg via INTRAVENOUS

## 2020-08-11 SURGICAL SUPPLY — 15 items

## 2020-08-11 NOTE — Anesthesia Postprocedure Evaluation (Signed)
Anesthesia Post Note  Patient: [name]  Procedure(s) Performed: ESOPHAGOGASTRODUODENOSCOPY (EGD) WITH PROPOFOL (N/A ) BIOPSY     Patient location during evaluation: Endoscopy Anesthesia Type: General Level of consciousness: awake and alert Pain management: pain level controlled Vital Signs Assessment: post-procedure vital signs reviewed and stable Respiratory status: spontaneous breathing, nonlabored ventilation, respiratory function stable and patient connected to nasal cannula oxygen Cardiovascular status: blood pressure returned to baseline and stable Postop Assessment: no apparent nausea or vomiting Anesthetic complications: no   No complications documented.  Last Vitals:  Vitals:   08/11/20 1450 08/11/20 1500  BP: (!) 181/96 (!) 182/96  Pulse: 95 92  Resp: 17 20  Temp:    SpO2: 99% 97%    Last Pain:  Vitals:   08/11/20 1500  TempSrc:   PainSc: 0-No pain                 Catalina Gravel

## 2020-08-11 NOTE — Progress Notes (Signed)
PROGRESS NOTE    [name]  MEB:[id number] DOB: Dec 29, 1970 DOA: 08/08/2020 PCP: [name], PA-C   Brief Narrative: [name] is a 50 y.o. female with a history of diabetes type 1 on insulin pump, diastolic heart failure, foot ulcer, diabetic neuropathy, migraine headaches. Patient presented secondary nausea and vomiting requiring IV antiemetics.   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 Previous workup for gastroparesis in 2020 significant for a normal gastric emptying study. No recent illness symptoms. No associated diarrhea. Patient with persistent nausea with vomiting and retching.  -Continue IV fluids while unable to take PO -Clear liquids as able -Scheduled Zofran IV; continue phenergan prn for refractory symptoms -GI recommendations: EGD planned for today  Diabetes mellitus, type 1 Patient manages with an insulin pump for which stopped working two days prior. No evidence of DKA on admission labs. Allergy to Lantus listed. -Levemir 10 units daily whille not taking oral nutrition -SSI q4 hours while not taking oral nutrition  Chronic diastolic heart failure Patient is on torsemide as an outpatient -Hold torsemide while not eating/while vomiting -Watch fluid balance  Primary hypertension Patient is on Coreg 6.$RemoveBe'25mg'aQMAkbpTv$  BID as an outpatient  Hyperlipidemia Patient is on Lipitor.  Chest pain Appears related to recurrent emesis. Possible rib fracture/contusion. Reproducible pain. EKG unchanged. -Chest x-ray   DVT prophylaxis: Lovenox Code Status:   Code Status: Full Code Family Communication: None at bedside Disposition Plan: Discharge home likely in several days pending  improvement of nausea/vomiting/ability to tolerate oral diet in addition to GI recommendations   Consultants:   Gastroenterology  Procedures:   None  Antimicrobials:  None    Subjective: Feeling better today but still nauseated.  Objective: Vitals:   08/11/20 0220 08/11/20 0257 08/11/20 0300 08/11/20 0521  BP: (!) 174/112 (!) 180/105 (!) 180/100 (!) 157/97  Pulse: 91 90  88  Resp:    16  Temp:    99 F (37.2 C)  TempSrc:    Oral  SpO2:    100%  Weight:      Height:       No intake or output data in the 24 hours ending 08/11/20 1320 Filed Weights   08/09/20 0049  Weight: 82.3 kg    Examination:  General exam: Appears calm. More comfortable appearing today. Smiled at me as I walked in Respiratory system: Clear to auscultation. Respiratory effort normal. Cardiovascular system: S1 & S2 heard, RRR. No murmurs, rubs, gallops or clicks. Gastrointestinal system: Abdomen is nondistended, soft and mildly tender. No organomegaly or masses felt. Normal bowel sounds heard. Central nervous system: Alert and oriented. No focal neurological deficits. Musculoskeletal: No edema. No calf tenderness. Reproducible left chest wall tenderness Skin: No cyanosis. No rashes Psychiatry: Judgement and insight appear normal. Mood & affect appropriate.   Data Reviewed: I have personally reviewed following labs and imaging studies  CBC Lab Results  Component Value Date   WBC 6.3 08/09/2020   RBC 3.33 (L) 08/09/2020   HGB 9.8 (L) 08/09/2020   HCT 29.7 (L) 08/09/2020   MCV 89.2 08/09/2020   MCH 29.4 08/09/2020   PLT 237 08/09/2020   MCHC 33.0 08/09/2020   RDW 12.5 08/09/2020   LYMPHSABS 1.6 06/08/2020   MONOABS 0.3 06/08/2020  EOSABS 0.1 06/08/2020   BASOSABS 0.0 12/45/8099     Last metabolic panel Lab Results  Component Value Date   NA 143 08/10/2020   K 3.6 08/10/2020   CL 110 08/10/2020   CO2 25 08/10/2020   BUN 19 08/10/2020   CREATININE 1.20 (H) 08/10/2020    GLUCOSE 195 (H) 08/10/2020   GFRNONAA 55 (L) 08/10/2020   GFRAA 102 01/31/2020   CALCIUM 8.3 (L) 08/10/2020   PHOS 3.3 10/13/2019   PROT 4.6 (L) 08/09/2020   ALBUMIN 2.0 (L) 08/09/2020   LABGLOB 2.5 11/24/2019   AGRATIO 1.2 11/24/2019   BILITOT 0.7 08/09/2020   ALKPHOS 50 08/09/2020   AST 14 (L) 08/09/2020   ALT 15 08/09/2020   ANIONGAP 8 08/10/2020    CBG (last 3)  Recent Labs    08/11/20 0305 08/11/20 0726 08/11/20 1201  GLUCAP 99 126* 149*     GFR: Estimated Creatinine Clearance: 64.7 mL/min (A) (by C-G formula based on SCr of 1.2 mg/dL (H)).  Coagulation Profile: No results for input(s): INR, PROTIME in the last 168 hours.  Recent Results (from the past 240 hour(s))  SARS CORONAVIRUS 2 (TAT 6-24 HRS) Nasopharyngeal Nasopharyngeal Swab     Status: None   Collection Time: 08/10/20  5:30 PM   Specimen: Nasopharyngeal Swab  Result Value Ref Range Status   SARS Coronavirus 2 NEGATIVE NEGATIVE Final    Comment: (NOTE) SARS-CoV-2 target nucleic acids are NOT DETECTED.  The SARS-CoV-2 RNA is generally detectable in upper and lower respiratory specimens during the acute phase of infection. Negative results do not preclude SARS-CoV-2 infection, do not rule out co-infections with other pathogens, and should not be used as the sole basis for treatment or other patient management decisions. Negative results must be combined with clinical observations, patient history, and epidemiological information. The expected result is Negative.  Fact Sheet for Patients: SugarRoll.be  Fact Sheet for Healthcare Providers: https://www.woods-mathews.com/  This test is not yet approved or cleared by the [place] FDA and  has been authorized for detection and/or diagnosis of SARS-CoV-2 by FDA under an Emergency Use Authorization (EUA). This EUA will remain  in effect (meaning this test can be used) for the duration of the COVID-19  declaration under Se ction 564(b)(1) of the Act, 21 U.S.C. section 360bbb-3(b)(1), unless the authorization is terminated or revoked sooner.  Performed at [facility], [place] [address], [place], [place] [address]         Radiology Studies: No results found.      Scheduled Meds: . carvedilol  6.25 mg Oral BID WC  . insulin aspart  0-15 Units Subcutaneous Q4H  . insulin detemir  10 Units Subcutaneous Daily  . ondansetron (ZOFRAN) IV  4 mg Intravenous Q6H  . pantoprazole (PROTONIX) IV  40 mg Intravenous Q12H  . promethazine  12.5 mg Intravenous QID   Continuous Infusions: . lactated ringers 125 mL/hr at 08/11/20 1146     LOS: 2 days     Cordelia Poche, MD [facility] 08/11/2020, 1:20 PM  If 7PM-7AM, please contact night-coverage [email or web address]

## 2020-08-11 NOTE — Anesthesia Procedure Notes (Signed)
Procedure Name: Intubation Date/Time: 08/11/2020 2:04 PM Performed by: Eben Burow, CRNA Pre-anesthesia Checklist: Patient identified, Emergency Drugs available, Suction available, Patient being monitored and Timeout performed Patient Re-evaluated:Patient Re-evaluated prior to induction Oxygen Delivery Method: Circle system utilized Preoxygenation: Pre-oxygenation with 100% oxygen Induction Type: IV induction and Rapid sequence Laryngoscope Size: Mac and 4 Grade View: Grade I Tube type: Oral Tube size: 7.0 mm Number of attempts: 1 Airway Equipment and Method: Stylet Placement Confirmation: ETT inserted through vocal cords under direct vision,  positive ETCO2 and breath sounds checked- equal and bilateral Secured at: 21 cm Tube secured with: Tape Dental Injury: Teeth and Oropharynx as per pre-operative assessment

## 2020-08-11 NOTE — Anesthesia Preprocedure Evaluation (Addendum)
Anesthesia Evaluation  Patient identified by MRN, date of birth, ID band Patient awake    Reviewed: Allergy & Precautions, NPO status , Patient's Chart, lab work & pertinent test results, reviewed documented beta blocker date and time   Airway Mallampati: II  TM Distance: >3 FB Neck ROM: Full    Dental  (+) Teeth Intact, Dental Advisory Given   Pulmonary asthma ,    Pulmonary exam normal breath sounds clear to auscultation       Cardiovascular hypertension, Pt. on home beta blockers +CHF  Normal cardiovascular exam Rhythm:Regular Rate:Normal     Neuro/Psych  Headaches, PSYCHIATRIC DISORDERS Anxiety Depression  Neuromuscular disease    GI/Hepatic Neg liver ROS, Nausea/vomiting    Endo/Other  diabetes, Poorly Controlled, Type 1, Insulin Dependent  Renal/GU Renal InsufficiencyRenal disease     Musculoskeletal  (+) Arthritis ,   Abdominal   Peds  Hematology  (+) Blood dyscrasia, anemia ,   Anesthesia Other Findings Day of surgery medications reviewed with the patient.  Reproductive/Obstetrics                            Anesthesia Physical Anesthesia Plan  ASA: III  Anesthesia Plan: General   Post-op Pain Management:    Induction: Intravenous, Rapid sequence and Cricoid pressure planned  PONV Risk Score and Plan: 3 and Treatment may vary due to age or medical condition, Dexamethasone and Diphenhydramine  Airway Management Planned: Oral ETT  Additional Equipment:   Intra-op Plan:   Post-operative Plan: Extubation in OR  Informed Consent: I have reviewed the patients History and Physical, chart, labs and discussed the procedure including the risks, benefits and alternatives for the proposed anesthesia with the patient or authorized representative who has indicated his/her understanding and acceptance.     Dental advisory given  Plan Discussed with: CRNA and  Anesthesiologist  Anesthesia Plan Comments: (Discussed risks/benefits/alternatives to MAC sedation including need for ventilatory support, hypotension, need for conversion to general anesthesia.  All patient questions answered.  Patient/guardian wishes to proceed.)       Anesthesia Quick Evaluation

## 2020-08-11 NOTE — Transfer of Care (Signed)
Immediate Anesthesia Transfer of Care Note  Patient: [name]  Procedure(s) Performed: ESOPHAGOGASTRODUODENOSCOPY (EGD) WITH PROPOFOL (N/A ) BIOPSY  Patient Location: PACU and Endoscopy Unit  Anesthesia Type:General  Level of Consciousness: awake, alert  and patient cooperative  Airway & Oxygen Therapy: Patient Spontanous Breathing and Patient connected to face mask oxygen  Post-op Assessment: Report given to RN and Post -op Vital signs reviewed and stable  Post vital signs: Reviewed and stable  Last Vitals:  Vitals Value Taken Time  BP    Temp    Pulse 94 08/11/20 1435  Resp 33 08/11/20 1435  SpO2 99 % 08/11/20 1435  Vitals shown include unvalidated device data.  Last Pain:  Vitals:   08/11/20 1323  TempSrc: Oral  PainSc: 0-No pain      Patients Stated Pain Goal: 2 (00/37/04 8889)  Complications: No complications documented.

## 2020-08-11 NOTE — Op Note (Signed)
[facility] Patient Name: [name] Procedure Date: 08/11/2020 MRN: [id number] Attending MD: Ladene Artist , MD Date of Birth: 13-Mar-1971 CSN: [id number] Age: 50 Admit Type: Inpatient Procedure:                Upper GI endoscopy Indications:              Generalized abdominal pain, Nausea, Persistent                            vomiting Providers:                Pricilla Riffle. Fuller Plan, MD, Benay Pillow, RN, Cletis Athens, Technician Referring MD:             [facility] Medicines:                General Anesthesia Complications:            No immediate complications. Estimated Blood Loss:     Estimated blood loss was minimal. Procedure:                Pre-Anesthesia Assessment:                           - Prior to the procedure, a History and Physical                            was performed, and patient medications and                            allergies were reviewed. The patient's tolerance of                            previous anesthesia was also reviewed. The risks                            and benefits of the procedure and the sedation                            options and risks were discussed with the patient.                            All questions were answered, and informed consent                            was obtained. Prior Anticoagulants: The patient has                            taken no previous anticoagulant or antiplatelet                            agents. ASA Grade Assessment: III - A patient with                            severe systemic  disease. After reviewing the risks                            and benefits, the patient was deemed in                            satisfactory condition to undergo the procedure.                           After obtaining informed consent, the endoscope was                            passed under direct vision. Throughout the                            procedure, the patient's  blood pressure, pulse, and                            oxygen saturations were monitored continuously. The                            GIF-H190 ([id number]) Olympus gastroscope was                            introduced through the mouth, and advanced to the                            second part of duodenum. The upper GI endoscopy was                            accomplished without difficulty. The patient                            tolerated the procedure well. Scope In: Scope Out: Findings:      LA Grade C (one or more mucosal breaks continuous between tops of 2 or       more mucosal folds, less than 75% circumference) esophagitis with no       bleeding was found in the distal esophagus. Findings secondary to       vomiting and/or due to reflux.      The exam of the esophagus was otherwise normal.      Localized moderately erythematous mucosa without bleeding was found in       the gastric fundus. Biopsies were taken with a cold forceps for       histology. This finding is likely secondary to vomiting.      A few small sessile polyps with no bleeding and no stigmata of recent       bleeding were found in the gastric fundus and in the gastric body.       Biopsies were taken with a cold forceps for histology.      The exam of the stomach was otherwise normal.      The duodenal bulb and second portion of the duodenum were normal. Impression:               - LA Grade C reflux esophagitis with no  bleeding.                           - Focal erythematous mucosa in the gastric fundus.                            Biopsied.                           - A few gastric polyps. Biopsied.                           - Normal duodenal bulb and second portion of the                            duodenum. Moderate Sedation:      Not Applicable - Patient had care per Anesthesia. Recommendation:           - Return patient to hospital ward for ongoing care.                           - No clear cause for N/V found.  LA Class C                            esophagitis could be d/t reflux and/or d/t to                            vomiting.                           - Clear liquid diet today.                           - Continue present medications including                            pantoprazole 40 mg IV bid and change to PO bid when                            N/V subsides.                           - Await pathology results.                           - Outpatient GI follow up with Dr. Loletha Carrow. Procedure Code(s):        --- Professional ---                           [phone], Esophagogastroduodenoscopy, flexible,                            transoral; with biopsy, single or multiple Diagnosis Code(s):        --- Professional ---                           K21.00, Gastro-esophageal  reflux disease with                            esophagitis, without bleeding                           K31.89, Other diseases of stomach and duodenum                           K31.7, Polyp of stomach and duodenum                           R10.84, Generalized abdominal pain                           R11.0, Nausea                           W58.09, Cyclical vomiting syndrome unrelated to                            migraine CPT copyright 2019 [employer]. All rights reserved. The codes documented in this report are preliminary and upon coder review may  be revised to meet current compliance requirements. Ladene Artist, MD 08/11/2020 2:31:13 PM This report has been signed electronically. Number of Addenda: 0

## 2020-08-11 NOTE — Progress Notes (Signed)
Patient c/o of chest pain rated at a 10, vital signs taken and physician notified.  08/11/20 0143  Vitals  Temp 98.6 F (37 C)  Temp Source Oral  BP (!) 169/99  MAP (mmHg) 121  BP Location Left Arm  BP Method Automatic  Patient Position (if appropriate) Lying  Pulse Rate 97  Pulse Rate Source Dinamap  Resp 16  MEWS COLOR  MEWS Score Color Green  Pain Assessment  Pain Scale 0-10  Pain Score 10  Pain Type Acute pain  Pain Location Back  Pain Orientation Mid  Pain Intervention(s) Medication (See eMAR)  MEWS Score  MEWS Temp 0  MEWS Systolic 0  MEWS Pulse 0  MEWS RR 0  MEWS LOC 0  MEWS Score 0

## 2020-08-11 NOTE — Interval H&P Note (Signed)
History and Physical Interval Note:  08/11/2020 2:08 PM  [name]  has presented today for surgery, with the diagnosis of Nausea and vomiting.  The various methods of treatment have been discussed with the patient and family. After consideration of risks, benefits and other options for treatment, the patient has consented to  Procedure(s): ESOPHAGOGASTRODUODENOSCOPY (EGD) WITH PROPOFOL (N/A) as a surgical intervention.  The patient's history has been reviewed, patient examined, no change in status, stable for surgery.  I have reviewed the patient's chart and labs.  Questions were answered to the patient's satisfaction.     Pricilla Riffle. Fuller Plan

## 2020-08-11 NOTE — Progress Notes (Signed)
Patient has had some relief after two doses of nitroglycerin SL tabs. Patient states that her chest feels heavy. Scheduled ordered Prn dose of morphine given, will follow up and continue to monitor, EKG performed as ordered. Review vital signs for BP checks.

## 2020-08-12 ENCOUNTER — Inpatient Hospital Stay (HOSPITAL_COMMUNITY): Payer: BC Managed Care – PPO

## 2020-08-12 DIAGNOSIS — R1084 Generalized abdominal pain: Secondary | ICD-10-CM

## 2020-08-12 LAB — CBC
HCT: 30.6 % — ABNORMAL LOW (ref 36.0–46.0)
Hemoglobin: 10.2 g/dL — ABNORMAL LOW (ref 12.0–15.0)
MCH: 30.2 pg (ref 26.0–34.0)
MCHC: 33.3 g/dL (ref 30.0–36.0)
MCV: 90.5 fL (ref 80.0–100.0)
Platelets: 225 10*3/uL (ref 150–400)
RBC: 3.38 MIL/uL — ABNORMAL LOW (ref 3.87–5.11)
RDW: 12.1 % (ref 11.5–15.5)
WBC: 7.9 10*3/uL (ref 4.0–10.5)
nRBC: 0 % (ref 0.0–0.2)

## 2020-08-12 LAB — GLUCOSE, CAPILLARY
Glucose-Capillary: 100 mg/dL — ABNORMAL HIGH (ref 70–99)
Glucose-Capillary: 103 mg/dL — ABNORMAL HIGH (ref 70–99)
Glucose-Capillary: 122 mg/dL — ABNORMAL HIGH (ref 70–99)
Glucose-Capillary: 129 mg/dL — ABNORMAL HIGH (ref 70–99)
Glucose-Capillary: 166 mg/dL — ABNORMAL HIGH (ref 70–99)
Glucose-Capillary: 59 mg/dL — ABNORMAL LOW (ref 70–99)
Glucose-Capillary: 76 mg/dL (ref 70–99)

## 2020-08-12 LAB — BASIC METABOLIC PANEL
Anion gap: 5 (ref 5–15)
BUN: 12 mg/dL (ref 6–20)
CO2: 26 mmol/L (ref 22–32)
Calcium: 7.7 mg/dL — ABNORMAL LOW (ref 8.9–10.3)
Chloride: 109 mmol/L (ref 98–111)
Creatinine, Ser: 1.08 mg/dL — ABNORMAL HIGH (ref 0.44–1.00)
GFR, Estimated: >60 mL/min (ref >60)
Glucose, Bld: 116 mg/dL — ABNORMAL HIGH (ref 70–99)
Potassium: 3.3 mmol/L — ABNORMAL LOW (ref 3.5–5.1)
Sodium: 140 mmol/L (ref 135–145)

## 2020-08-12 LAB — PROCALCITONIN: Procalcitonin: <0.1 ng/mL

## 2020-08-12 MED ORDER — ALUM & MAG HYDROXIDE-SIMETH 200-200-20 MG/5ML PO SUSP
30.0000 mL | ORAL | Status: DC | PRN
  Administered 2020-08-12: 30 mL via ORAL
  Filled 2020-08-12: qty 30

## 2020-08-12 MED ORDER — SUCRALFATE 1 GM/10ML PO SUSP
1.0000 g | Freq: Three times a day (TID) | ORAL | Status: DC
  Administered 2020-08-12 – 2020-08-15 (×15): 1 g via ORAL
  Filled 2020-08-12 (×16): qty 10

## 2020-08-12 MED ORDER — POTASSIUM CHLORIDE 20 MEQ PO PACK
40.0000 meq | PACK | Freq: Once | ORAL | Status: AC
  Administered 2020-08-12: 40 meq via ORAL
  Filled 2020-08-12: qty 2

## 2020-08-12 MED ORDER — POTASSIUM CHLORIDE CRYS ER 20 MEQ PO TBCR
40.0000 meq | EXTENDED_RELEASE_TABLET | Freq: Once | ORAL | Status: DC
  Filled 2020-08-12: qty 2

## 2020-08-12 MED ORDER — CALCIUM CARBONATE ANTACID 500 MG PO CHEW
1.0000 | CHEWABLE_TABLET | Freq: Three times a day (TID) | ORAL | Status: DC | PRN
  Administered 2020-08-12 – 2020-08-14 (×3): 200 mg via ORAL
  Filled 2020-08-12 (×3): qty 1

## 2020-08-12 NOTE — Progress Notes (Signed)
    Progress Note   Subjective  Nausea, vomiting persists but has improved. She complains of chest and abdomen pain, soreness.   Objective  Vital signs in last 24 hours: Temp:  [98.5 F (36.9 C)-100.2 F (37.9 C)] 98.6 F (37 C) (01/08 0409) Pulse Rate:  [90-98] 90 (01/08 0409) Resp:  [12-33] 17 (01/08 0409) BP: (122-197)/(68-136) 158/68 (01/08 0409) SpO2:  [95 %-100 %] 98 % (01/08 0409) Weight:  [82.3 kg] 82.3 kg (01/07 1323) Last BM Date: 08/11/20  General: Alert, well-developed, in NAD Heart:  Regular rate and rhythm; no murmurs Chest: Clear to ascultation bilaterally, chest wall tenderness Abdomen:  Soft, mild diffuse tenderness and nondistended. Normal bowel sounds, without guarding, and without rebound.   Extremities:  Without edema. Neurologic:  Alert and  oriented x4; grossly normal neurologically. Psych:  Alert and cooperative. Normal mood and affect.  Intake/Output from previous day: 01/07 0701 - 01/08 0700 In: 671.4 [P.O.:240; I.V.:431.4] Out: -  Intake/Output this shift: No intake/output data recorded.  Lab Results: Recent Labs    08/12/20 0648  WBC 7.9  HGB 10.2*  HCT 30.6*  PLT 225   BMET Recent Labs    08/10/20 0800 08/12/20 0648  NA 143 140  K 3.6 3.3*  CL 110 109  CO2 25 26  GLUCOSE 195* 116*  BUN 19 12  CREATININE 1.20* 1.08*  CALCIUM 8.3* 7.7*    Studies/Results: DG CHEST PORT 1 VIEW  Result Date: 08/11/2020 CLINICAL DATA:  Emesis and left-sided chest pain. Status post EGD today. EXAM: PORTABLE CHEST 1 VIEW COMPARISON:  June 12, 2020 FINDINGS: The heart, hila, and mediastinum are normal. No mediastinal air seen on this study. No pneumothorax. Haziness is seen centrally on the right in the infrahilar region. There appears to be opacity in the left retrocardiac region. No other acute abnormalities. IMPRESSION: 1. There appears to be infiltrate in the left retrocardiac region which could represent atelectasis, pneumonia, or aspiration  given history. 2. Haziness in the right perihilar region could be due to atelectasis or developing infiltrate. 3. Recommend a PA and lateral chest x-ray when the patient is able for better evaluation. Electronically Signed   By: Dorise Bullion III M.D   On: 08/11/2020 15:05      Assessment & Recommendations   1. Intractable N/V possibly related to hyperglycemia and reflux. Symptoms improved however they persist. Continue RTC Zofran and Phenergan.   2. Erosive esophagitis. Continue pantoprazole 40 mg IV bid. Add Carafate suspension qid and TUMS prn. EGD biopsies pending.   3. Chest pain and abdominal pain appears to be musculoskeletal secondary to intractable vomiting.   4. Anemia. Colonoscopy per Dr. Loletha Carrow as outpatient after N/V has completely resolved so she is able to adequately complete a bowel prep.   5. DM1, hyperglycemia is under better control his morning.   6. Abnormal chest film, per primary service.   7. Hypokalemia, per primary service.    LOS: 3 days   Norberto Sorenson T. Fuller Plan MD 08/12/2020, 10:06 AM

## 2020-08-12 NOTE — Progress Notes (Signed)
PROGRESS NOTE    [name]  WVP:[id number] DOB: 08/03/1971 DOA: 08/08/2020 PCP: [name], PA-C   Brief Narrative: [name] is a 50 y.o. female with a history of diabetes type 1 on insulin pump, diastolic heart failure, foot ulcer, diabetic neuropathy, migraine headaches. Patient presented secondary nausea and vomiting requiring IV antiemetics.   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 Previous workup for gastroparesis in 2020 significant for a normal gastric emptying study. No recent illness symptoms. No associated diarrhea. Patient with persistent nausea with vomiting and retching which has improved slightly. EGD performed on 1/7 without etiology found.  -Continue IV fluids while unable to take PO consistently -Clear liquids; advance as able -Scheduled Zofran IV; continue phenergan prn for refractory symptoms -GI recommendations: Continue antiemetics  Erosive esophagitis Seen on EGD. Biopsies obtained -GI recommendations: Protonix, Tums and Carafate suspension -Await biopsies  Diabetes mellitus, type 1 Patient manages with an insulin pump for which stopped working two days prior. No evidence of DKA on admission labs. Allergy to Lantus listed. -Levemir 10 units daily whille not taking oral nutrition -SSI q4 hours while not taking oral nutrition  Chronic diastolic heart failure Patient is on torsemide as an outpatient -Hold torsemide while not eating/while vomiting -Watch fluid balance  Primary hypertension Patient is on Coreg 6.$RemoveBe'25mg'KTIDFmOtF$  BID as an outpatient  Hyperlipidemia Patient is on Lipitor.  Chest pain Appears related to  recurrent emesis. Possible rib fracture/contusion. Reproducible pain. EKG unchanged. Portable chest x-ray suggests some infiltrate but is difficult to delineate. Ordered 2 view chest x-ray confirms infiltrate. Patient without pneumonia symptoms or signs. -Check procalcitonin; start antibiotics if elevated.   DVT prophylaxis: Lovenox Code Status:   Code Status: Full Code Family Communication: None at bedside Disposition Plan: Discharge home likely in several days pending improvement of nausea/vomiting/ability to tolerate oral diet in addition to GI recommendations   Consultants:   Gastroenterology  Procedures:   UPPER ENDOSCOPY (08/12/2019)  Antimicrobials:  None    Subjective: Does not feel better at all. Not vomiting as frequently. Hesitant about trying Reglan.  Objective: Vitals:   08/11/20 1510 08/11/20 1538 08/11/20 2025 08/12/20 0409  BP: (!) 196/113 (!) 154/96 (!) 142/83 (!) 158/68  Pulse: 98 92 93 90  Resp: $Remo'12 16 18 17  'TydPH$ Temp:  100.2 F (37.9 C) 98.8 F (37.1 C) 98.6 F (37 C)  TempSrc:  Oral Oral Oral  SpO2: 95% 98% 100% 98%  Weight:      Height:        Intake/Output Summary (Last 24 hours) at 08/12/2020 0944 Last data filed at 08/11/2020 2221 Gross per 24 hour  Intake 671.4 ml  Output --  Net 671.4 ml   Filed Weights   08/09/20 0049 08/11/20 1323  Weight: 82.3 kg 82.3 kg    Examination:  General exam: Appears calm and somewhat uncomfortable Respiratory system: Clear to auscultation. Respiratory effort normal. Cardiovascular system: S1 & S2 heard, RRR. No murmurs, rubs, gallops or clicks. Gastrointestinal system: Abdomen is nondistended, soft and minimally tender. No organomegaly or masses felt. Normal bowel sounds heard. Central nervous system: Alert and oriented. No focal neurological deficits. Musculoskeletal: No edema. No calf tenderness Skin: No  cyanosis. No rashes Psychiatry: Judgement and insight appear normal. Mood & affect appropriate.   Data  Reviewed: I have personally reviewed following labs and imaging studies  CBC Lab Results  Component Value Date   WBC 7.9 08/12/2020   RBC 3.38 (L) 08/12/2020   HGB 10.2 (L) 08/12/2020   HCT 30.6 (L) 08/12/2020   MCV 90.5 08/12/2020   MCH 30.2 08/12/2020   PLT 225 08/12/2020   MCHC 33.3 08/12/2020   RDW 12.1 08/12/2020   LYMPHSABS 1.6 06/08/2020   MONOABS 0.3 06/08/2020   EOSABS 0.1 06/08/2020   BASOSABS 0.0 58/52/7782     Last metabolic panel Lab Results  Component Value Date   NA 140 08/12/2020   K 3.3 (L) 08/12/2020   CL 109 08/12/2020   CO2 26 08/12/2020   BUN 12 08/12/2020   CREATININE 1.08 (H) 08/12/2020   GLUCOSE 116 (H) 08/12/2020   GFRNONAA >60 08/12/2020   GFRAA 102 01/31/2020   CALCIUM 7.7 (L) 08/12/2020   PHOS 3.3 10/13/2019   PROT 4.6 (L) 08/09/2020   ALBUMIN 2.0 (L) 08/09/2020   LABGLOB 2.5 11/24/2019   AGRATIO 1.2 11/24/2019   BILITOT 0.7 08/09/2020   ALKPHOS 50 08/09/2020   AST 14 (L) 08/09/2020   ALT 15 08/09/2020   ANIONGAP 5 08/12/2020    CBG (last 3)  Recent Labs    08/11/20 2347 08/12/20 0402 08/12/20 0734  GLUCAP 183* 122* 100*     GFR: Estimated Creatinine Clearance: 71.9 mL/min (A) (by C-G formula based on SCr of 1.08 mg/dL (H)).  Coagulation Profile: No results for input(s): INR, PROTIME in the last 168 hours.  Recent Results (from the past 240 hour(s))  SARS CORONAVIRUS 2 (TAT 6-24 HRS) Nasopharyngeal Nasopharyngeal Swab     Status: None   Collection Time: 08/10/20  5:30 PM   Specimen: Nasopharyngeal Swab  Result Value Ref Range Status   SARS Coronavirus 2 NEGATIVE NEGATIVE Final    Comment: (NOTE) SARS-CoV-2 target nucleic acids are NOT DETECTED.  The SARS-CoV-2 RNA is generally detectable in upper and lower respiratory specimens during the acute phase of infection. Negative results do not preclude SARS-CoV-2 infection, do not rule out co-infections with other pathogens, and should not be used as the sole basis for  treatment or other patient management decisions. Negative results must be combined with clinical observations, patient history, and epidemiological information. The expected result is Negative.  Fact Sheet for Patients: SugarRoll.be  Fact Sheet for Healthcare Providers: https://www.woods-mathews.com/  This test is not yet approved or cleared by the [place] FDA and  has been authorized for detection and/or diagnosis of SARS-CoV-2 by FDA under an Emergency Use Authorization (EUA). This EUA will remain  in effect (meaning this test can be used) for the duration of the COVID-19 declaration under Se ction 564(b)(1) of the Act, 21 U.S.C. section 360bbb-3(b)(1), unless the authorization is terminated or revoked sooner.  Performed at [facility], [place] [address] [place], [place] [address]         Radiology Studies: DG CHEST PORT 1 VIEW  Result Date: 08/11/2020 CLINICAL DATA:  Emesis and left-sided chest pain. Status post EGD today. EXAM: PORTABLE CHEST 1 VIEW COMPARISON:  June 12, 2020 FINDINGS: The heart, hila, and mediastinum are normal. No mediastinal air seen on this study. No pneumothorax. Haziness is seen centrally on the right in the infrahilar region. There appears to be opacity in the left retrocardiac region. No other acute abnormalities. IMPRESSION: 1. There appears to  be infiltrate in the left retrocardiac region which could represent atelectasis, pneumonia, or aspiration given history. 2. Haziness in the right perihilar region could be due to atelectasis or developing infiltrate. 3. Recommend a PA and lateral chest x-ray when the patient is able for better evaluation. Electronically Signed   By: Dorise Bullion III M.D   On: 08/11/2020 15:05        Scheduled Meds: . carvedilol  6.25 mg Oral BID WC  . insulin aspart  0-15 Units Subcutaneous Q4H  . insulin detemir  10 Units Subcutaneous Daily  . ondansetron (ZOFRAN) IV   4 mg Intravenous Q6H  . pantoprazole (PROTONIX) IV  40 mg Intravenous Q12H  . potassium chloride  40 mEq Oral Once  . promethazine  12.5 mg Intravenous QID   Continuous Infusions: . lactated ringers 125 mL/hr at 08/12/20 6754     LOS: 3 days     Cordelia Poche, MD [facility] 08/12/2020, 9:44 AM  If 7PM-7AM, please contact night-coverage [email or web address]

## 2020-08-13 ENCOUNTER — Inpatient Hospital Stay (HOSPITAL_COMMUNITY): Payer: BC Managed Care – PPO

## 2020-08-13 ENCOUNTER — Encounter (HOSPITAL_COMMUNITY): Payer: Self-pay | Admitting: Gastroenterology

## 2020-08-13 LAB — GLUCOSE, CAPILLARY
Glucose-Capillary: 114 mg/dL — ABNORMAL HIGH (ref 70–99)
Glucose-Capillary: 119 mg/dL — ABNORMAL HIGH (ref 70–99)
Glucose-Capillary: 170 mg/dL — ABNORMAL HIGH (ref 70–99)
Glucose-Capillary: 172 mg/dL — ABNORMAL HIGH (ref 70–99)
Glucose-Capillary: 207 mg/dL — ABNORMAL HIGH (ref 70–99)
Glucose-Capillary: 94 mg/dL (ref 70–99)

## 2020-08-13 NOTE — Progress Notes (Signed)
Progress Note   Subjective  Chief Complaint: Nausea, vomiting and abdominal pain  Patient tells me that she continues to have a very sore abdomen, worse at the top.  Does tell me though that her nausea and vomiting seem to be under control with her current regimen of medications.  She is requesting more of a diet today.   Objective   Vital signs in last 24 hours: Temp:  [97.6 F (36.4 C)-99.8 F (37.7 C)] 99.8 F (37.7 C) (01/09 0415) Pulse Rate:  [89-90] 90 (01/09 0415) Resp:  [17-18] 17 (01/09 0415) BP: (147-168)/(91-98) 168/96 (01/09 0415) SpO2:  [95 %-100 %] 95 % (01/09 0415) Last BM Date: 08/12/20 General:   AA female in NAD Heart:  Regular rate and rhythm; no murmurs Lungs: Respirations even and unlabored, lungs CTA bilaterally Abdomen:  Soft, mild generalized ttp, some worse in the upper abdomen and nondistended. Normal bowel sounds. Psych:  Cooperative. Normal mood and affect.  Intake/Output from previous day: 01/08 0701 - 01/09 0700 In: 983 [I.V.:983] Out: -   Lab Results: Recent Labs    08/12/20 0648  WBC 7.9  HGB 10.2*  HCT 30.6*  PLT 225   BMET Recent Labs    08/12/20 0648  NA 140  K 3.3*  CL 109  CO2 26  GLUCOSE 116*  BUN 12  CREATININE 1.08*  CALCIUM 7.7*    Studies/Results: DG Chest 2 View  Result Date: 08/12/2020 CLINICAL DATA:  Chest pain, recurrent emesis. Possible rib fracture/contusion EXAM: CHEST - 2 VIEW COMPARISON:  Chest x-ray dated 08/11/2020. FINDINGS: Heart size and mediastinal contours are within normal limits. Patchy opacities within the LEFT lower lung similar to the recent chest x-ray. Small LEFT pleural effusion. Mild wedging of several midthoracic vertebral bodies, likely chronic. Osseous structures about the chest are otherwise unremarkable. IMPRESSION: 1. Patchy opacities within the LEFT lower lung, suspicious for pneumonia, similar to yesterday's chest x-ray. 2. Small LEFT pleural effusion. 3. No rib fracture or  displacement is seen. Electronically Signed   By: Franki Cabot M.D.   On: 08/12/2020 12:20   DG CHEST PORT 1 VIEW  Result Date: 08/11/2020 CLINICAL DATA:  Emesis and left-sided chest pain. Status post EGD today. EXAM: PORTABLE CHEST 1 VIEW COMPARISON:  June 12, 2020 FINDINGS: The heart, hila, and mediastinum are normal. No mediastinal air seen on this study. No pneumothorax. Haziness is seen centrally on the right in the infrahilar region. There appears to be opacity in the left retrocardiac region. No other acute abnormalities. IMPRESSION: 1. There appears to be infiltrate in the left retrocardiac region which could represent atelectasis, pneumonia, or aspiration given history. 2. Haziness in the right perihilar region could be due to atelectasis or developing infiltrate. 3. Recommend a PA and lateral chest x-ray when the patient is able for better evaluation. Electronically Signed   By: Dorise Bullion III M.D   On: 08/11/2020 15:05    Assessment / Plan:   Assessment: 1.  Intractable nausea/vomiting possibly related to hyperglycemia and reflux: Status post EGD 08/11/2020 with grade C reflux esophagitis and focal erythematous mucosa in the stomach (biopsy still pending), symptoms are improved but persist 2.  Erosive esophagitis: Seen at time of recent EGD 3.  Chest pain/abdominal pain: Thought secondary due to musculoskeletal strain from vomiting 4.  Anemia: Recommended outpatient colonoscopy when patient can complete a bowel prep with Dr. Loletha Carrow 5.  Diabetes type 1: Continue to achieve better control which will help symptoms 6.  Hypokalemia  Plan: 1.  Continue scheduled antiemetics.  The scheduling of her medicine seems to be working to abate her symptoms.  She can definitely stay on the scheduled antiemetics as an outpatient and follow up should be arranged with Dr. Loletha Carrow in our outpatient clinic in 3-4 weeks. 2.  Continue supportive measures 3.  Continue Pantoprazole 40 mg twice daily and  Carafate suspension 4 times daily and Tums as needed. 4. Advanced diet to regular 5.  Please await final recommendations from Dr. Hilarie Fredrickson.  We will likely sign off.  Thank you for your kind consultation.    LOS: 4 days   Levin Erp  08/13/2020, 1:02 PM

## 2020-08-13 NOTE — Progress Notes (Signed)
PROGRESS NOTE    Essence [name]  VVO:[id number] DOB: 1971-07-26 DOA: 08/08/2020 PCP: [name], PA-C   Brief Narrative: [name] is a 50 y.o. female with a history of diabetes type 1 on insulin pump, diastolic heart failure, foot ulcer, diabetic neuropathy, migraine headaches. Patient presented secondary nausea and vomiting requiring IV antiemetics.   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 Previous workup for gastroparesis in 2020 significant for a normal gastric emptying study. No recent illness symptoms. No associated diarrhea. Patient with persistent nausea with vomiting and retching which has improved slightly. EGD performed on 1/7 without etiology found.  -Continue IV fluids while unable to take PO consistently -Full liquids; advance as able -Will trial Zofran ODT; continue phenergan IV prn for refractory symptoms -GI recommendations: Continue antiemetics  Erosive esophagitis Seen on EGD. Biopsies obtained -GI recommendations: Protonix, Tums and Carafate suspension -Await biopsy results  Diabetes mellitus, type 1 Patient manages with an insulin pump for which stopped working two days prior. No evidence of DKA on admission labs. Allergy to Lantus listed. -Levemir 10 units daily whille not taking oral nutrition -SSI q4 hours while not taking oral nutrition  Chronic diastolic heart failure Patient is on torsemide as an outpatient -Hold torsemide while not eating/while vomiting -Watch fluid balance  Primary hypertension Patient is on Coreg 6.$RemoveBe'25mg'VzCnawUhc$  BID as an outpatient. Uncontrolled. -Continue Coreg  Hyperlipidemia Patient is on  Lipitor.  Chest pain Appears related to recurrent emesis. Possible rib fracture/contusion. Reproducible pain. EKG unchanged. Portable chest x-ray suggests some infiltrate but is difficult to delineate. Ordered 2 view chest x-ray confirms infiltrate. Patient without pneumonia symptoms or signs. Procalcitonin undetectable. -Chest CT  DVT prophylaxis: Lovenox Code Status:   Code Status: Full Code Family Communication: None at bedside Disposition Plan: Discharge home likely in several days pending improvement of nausea/vomiting/ability to tolerate oral diet in addition to GI recommendations   Consultants:   Gastroenterology  Procedures:   UPPER ENDOSCOPY (08/12/2019)  Antimicrobials:  None    Subjective: Continues to have nausea and vomiting. Not as bad as at admission but feels miserable.  Objective: Vitals:   08/12/20 0409 08/12/20 1330 08/12/20 2007 08/13/20 0415  BP: (!) 158/68 (!) 161/98 (!) 147/91 (!) 168/96  Pulse: 90 90 89 90  Resp: $Remo'17 17 18 17  'RgBzY$ Temp: 98.6 F (37 C) 97.6 F (36.4 C) 98.9 F (37.2 C) 99.8 F (37.7 C)  TempSrc: Oral Oral Oral Oral  SpO2: 98% 99% 100% 95%  Weight:      Height:        Intake/Output Summary (Last 24 hours) at 08/13/2020 1300 Last data filed at 08/13/2020 0538 Gross per 24 hour  Intake 982.97 ml  Output -  Net 982.97 ml   Filed Weights   08/09/20 0049 08/11/20 1323  Weight: 82.3 kg 82.3 kg    Examination:  General exam: Appears calm and slightly uncomfortable Respiratory system: Clear to auscultation. Respiratory effort normal. Cardiovascular system: S1 & S2 heard, RRR. No murmurs, rubs, gallops or clicks. Gastrointestinal system: Abdomen is nondistended, soft and mild-moderately tender. No organomegaly or masses felt. Normal bowel sounds heard. Central nervous system: Alert and oriented. No focal neurological deficits. Musculoskeletal:  No edema. No calf tenderness Skin: No cyanosis. No rashes Psychiatry: Judgement and  insight appear normal. Mood & affect appropriate.   Data Reviewed: I have personally reviewed following labs and imaging studies  CBC Lab Results  Component Value Date   WBC 7.9 08/12/2020   RBC 3.38 (L) 08/12/2020   HGB 10.2 (L) 08/12/2020   HCT 30.6 (L) 08/12/2020   MCV 90.5 08/12/2020   MCH 30.2 08/12/2020   PLT 225 08/12/2020   MCHC 33.3 08/12/2020   RDW 12.1 08/12/2020   LYMPHSABS 1.6 06/08/2020   MONOABS 0.3 06/08/2020   EOSABS 0.1 06/08/2020   BASOSABS 0.0 22/97/9892     Last metabolic panel Lab Results  Component Value Date   NA 140 08/12/2020   K 3.3 (L) 08/12/2020   CL 109 08/12/2020   CO2 26 08/12/2020   BUN 12 08/12/2020   CREATININE 1.08 (H) 08/12/2020   GLUCOSE 116 (H) 08/12/2020   GFRNONAA >60 08/12/2020   GFRAA 102 01/31/2020   CALCIUM 7.7 (L) 08/12/2020   PHOS 3.3 10/13/2019   PROT 4.6 (L) 08/09/2020   ALBUMIN 2.0 (L) 08/09/2020   LABGLOB 2.5 11/24/2019   AGRATIO 1.2 11/24/2019   BILITOT 0.7 08/09/2020   ALKPHOS 50 08/09/2020   AST 14 (L) 08/09/2020   ALT 15 08/09/2020   ANIONGAP 5 08/12/2020    CBG (last 3)  Recent Labs    08/13/20 0416 08/13/20 0755 08/13/20 1153  GLUCAP 114* 119* 172*     GFR: Estimated Creatinine Clearance: 71.9 mL/min (A) (by C-G formula based on SCr of 1.08 mg/dL (H)).  Coagulation Profile: No results for input(s): INR, PROTIME in the last 168 hours.  Recent Results (from the past 240 hour(s))  SARS CORONAVIRUS 2 (TAT 6-24 HRS) Nasopharyngeal Nasopharyngeal Swab     Status: None   Collection Time: 08/10/20  5:30 PM   Specimen: Nasopharyngeal Swab  Result Value Ref Range Status   SARS Coronavirus 2 NEGATIVE NEGATIVE Final    Comment: (NOTE) SARS-CoV-2 target nucleic acids are NOT DETECTED.  The SARS-CoV-2 RNA is generally detectable in upper and lower respiratory specimens during the acute phase of infection. Negative results do not preclude SARS-CoV-2 infection, do not rule out co-infections with other  pathogens, and should not be used as the sole basis for treatment or other patient management decisions. Negative results must be combined with clinical observations, patient history, and epidemiological information. The expected result is Negative.  Fact Sheet for Patients: SugarRoll.be  Fact Sheet for Healthcare Providers: https://www.woods-mathews.com/  This test is not yet approved or cleared by the [place] FDA and  has been authorized for detection and/or diagnosis of SARS-CoV-2 by FDA under an Emergency Use Authorization (EUA). This EUA will remain  in effect (meaning this test can be used) for the duration of the COVID-19 declaration under Se ction 564(b)(1) of the Act, 21 U.S.C. section 360bbb-3(b)(1), unless the authorization is terminated or revoked sooner.  Performed at [facility], [place] [address] [place], [place] [address]         Radiology Studies: DG Chest 2 View  Result Date: 08/12/2020 CLINICAL DATA:  Chest pain, recurrent emesis. Possible rib fracture/contusion EXAM: CHEST - 2 VIEW COMPARISON:  Chest x-ray dated 08/11/2020. FINDINGS: Heart size and mediastinal contours are within normal limits. Patchy opacities within the LEFT lower lung similar to the recent chest x-ray. Small LEFT pleural effusion. Mild wedging of several midthoracic vertebral bodies, likely chronic. Osseous structures about the chest are  otherwise unremarkable. IMPRESSION: 1. Patchy opacities within the LEFT lower lung, suspicious for pneumonia, similar to yesterday's chest x-ray. 2. Small LEFT pleural effusion. 3. No rib fracture or displacement is seen. Electronically Signed   By: Franki Cabot M.D.   On: 08/12/2020 12:20   DG CHEST PORT 1 VIEW  Result Date: 08/11/2020 CLINICAL DATA:  Emesis and left-sided chest pain. Status post EGD today. EXAM: PORTABLE CHEST 1 VIEW COMPARISON:  June 12, 2020 FINDINGS: The heart, hila, and mediastinum  are normal. No mediastinal air seen on this study. No pneumothorax. Haziness is seen centrally on the right in the infrahilar region. There appears to be opacity in the left retrocardiac region. No other acute abnormalities. IMPRESSION: 1. There appears to be infiltrate in the left retrocardiac region which could represent atelectasis, pneumonia, or aspiration given history. 2. Haziness in the right perihilar region could be due to atelectasis or developing infiltrate. 3. Recommend a PA and lateral chest x-ray when the patient is able for better evaluation. Electronically Signed   By: Dorise Bullion III M.D   On: 08/11/2020 15:05        Scheduled Meds: . carvedilol  6.25 mg Oral BID WC  . insulin aspart  0-15 Units Subcutaneous Q4H  . insulin detemir  10 Units Subcutaneous Daily  . ondansetron (ZOFRAN) IV  4 mg Intravenous Q6H  . pantoprazole (PROTONIX) IV  40 mg Intravenous Q12H  . promethazine  12.5 mg Intravenous QID  . sucralfate  1 g Oral TID AC & HS   Continuous Infusions: . lactated ringers Stopped (08/13/20 0538)     LOS: 4 days     Cordelia Poche, MD [facility] 08/13/2020, 1:00 PM  If 7PM-7AM, please contact night-coverage [email or web address]

## 2020-08-14 ENCOUNTER — Other Ambulatory Visit: Payer: Self-pay

## 2020-08-14 ENCOUNTER — Inpatient Hospital Stay (HOSPITAL_COMMUNITY): Payer: BC Managed Care – PPO

## 2020-08-14 LAB — BASIC METABOLIC PANEL
Anion gap: 8 (ref 5–15)
BUN: 9 mg/dL (ref 6–20)
CO2: 24 mmol/L (ref 22–32)
Calcium: 7.2 mg/dL — ABNORMAL LOW (ref 8.9–10.3)
Chloride: 106 mmol/L (ref 98–111)
Creatinine, Ser: 1.18 mg/dL — ABNORMAL HIGH (ref 0.44–1.00)
GFR, Estimated: 56 mL/min — ABNORMAL LOW (ref >60)
Glucose, Bld: 118 mg/dL — ABNORMAL HIGH (ref 70–99)
Potassium: 3.5 mmol/L (ref 3.5–5.1)
Sodium: 138 mmol/L (ref 135–145)

## 2020-08-14 LAB — GLUCOSE, CAPILLARY
Glucose-Capillary: 86 mg/dL (ref 70–99)
Glucose-Capillary: 126 mg/dL — ABNORMAL HIGH (ref 70–99)
Glucose-Capillary: 134 mg/dL — ABNORMAL HIGH (ref 70–99)
Glucose-Capillary: 172 mg/dL — ABNORMAL HIGH (ref 70–99)
Glucose-Capillary: 139 mg/dL — ABNORMAL HIGH (ref 70–99)

## 2020-08-14 LAB — SURGICAL PATHOLOGY

## 2020-08-14 MED ORDER — ONDANSETRON 4 MG PO TBDP
4.0000 mg | ORAL_TABLET | Freq: Four times a day (QID) | ORAL | Status: DC
  Administered 2020-08-14 – 2020-08-16 (×8): 4 mg via ORAL
  Filled 2020-08-14 (×8): qty 1

## 2020-08-14 MED ORDER — INSULIN ASPART 100 UNIT/ML ~~LOC~~ SOLN
0.0000 [IU] | Freq: Every day | SUBCUTANEOUS | Status: DC
  Administered 2020-08-15: 2 [IU] via SUBCUTANEOUS

## 2020-08-14 MED ORDER — TORSEMIDE 20 MG PO TABS
20.0000 mg | ORAL_TABLET | ORAL | Status: DC
  Administered 2020-08-14 – 2020-08-16 (×2): 20 mg via ORAL
  Filled 2020-08-14 (×2): qty 1

## 2020-08-14 MED ORDER — INSULIN ASPART 100 UNIT/ML ~~LOC~~ SOLN
0.0000 [IU] | Freq: Three times a day (TID) | SUBCUTANEOUS | Status: DC
  Administered 2020-08-14: 2 [IU] via SUBCUTANEOUS
  Administered 2020-08-14: 3 [IU] via SUBCUTANEOUS
  Administered 2020-08-15: 2 [IU] via SUBCUTANEOUS
  Administered 2020-08-15: 3 [IU] via SUBCUTANEOUS
  Administered 2020-08-15: 2 [IU] via SUBCUTANEOUS
  Administered 2020-08-16 (×2): 5 [IU] via SUBCUTANEOUS

## 2020-08-14 MED ORDER — GUAIFENESIN-DM 100-10 MG/5ML PO SYRP
5.0000 mL | ORAL_SOLUTION | ORAL | Status: DC | PRN
  Administered 2020-08-14 – 2020-08-15 (×3): 5 mL via ORAL
  Filled 2020-08-14 (×3): qty 10

## 2020-08-14 NOTE — Progress Notes (Signed)
Chaplain engaged in initial visit with [facility].  During visit, Padme shared her journey with diabetes.  She stated how hard it is to function daily, especially since she has been very nauseous lately.  Izola discussed the necessity of working and retaining her health benefits while also feeling increasingly weak.  Sylvie also noted that her 51 year old son has a number of health challenges as well.  Aldene voiced wanting to have some normalcy for her life.  She became insulin dependent when she was 50 years old.    Caisley discussed how financially straining the specialist visits along with the medicine she takes can be.  Chaplain and Lanyla also discussed the lack of knowledge that exists around nutrition.  Chaplain suggested that speaking with social work may help in finding resources to pay for medicine.    Chaplain offered ministries of presence, listening and prayer with [place].     08/14/20 1300  Clinical Encounter Type  Visited With Patient  Visit Type Initial  Referral From Nurse  Consult/Referral To Chaplain

## 2020-08-14 NOTE — Progress Notes (Signed)
PROGRESS NOTE    [name]  TKP:[id number] DOB: 03/27/1971 DOA: 08/08/2020 PCP: [name], PA-C   Brief Narrative: [name] is a 50 y.o. female with a history of diabetes type 1 on insulin pump, diastolic heart failure, foot ulcer, diabetic neuropathy, migraine headaches. Patient presented secondary nausea and vomiting requiring IV antiemetics.   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 Previous workup for gastroparesis in 2020 significant for a normal gastric emptying study. No recent illness symptoms. No associated diarrhea. Patient with persistent nausea with vomiting and retching which has improved slightly. EGD performed on 1/7 without etiology found.  -Continue IV fluids while unable to take PO consistently -Advanced to carb modified diet -Will trial Zofran ODT; continue phenergan IV prn for refractory symptoms -GI recommendations: Continue antiemetics  Erosive esophagitis Seen on EGD. Biopsies obtained. Negative for H. Pylori. Reactive gastropathy and mild chronic gastritis also seen. -GI recommendations: Protonix, Tums and Carafate suspension  Diabetes mellitus, type 1 Patient manages with an insulin pump for which stopped working two days prior. No evidence of DKA on admission labs. Allergy to Lantus listed. -Continue Levemir 10 units -SSI qAC/HS  Chronic diastolic heart failure Patient is on torsemide as an outpatient -Resume home torsemide -Watch fluid balance  Bilateral pleural effusion In setting of significant fluid resuscitation. Asymptomatic. -Resume home torsemide   Primary hypertension Patient is on Coreg 6.$RemoveBe'25mg'cCwKTAIHU$  BID  as an outpatient. Uncontrolled. -Continue Coreg  Hyperlipidemia Patient is on Lipitor.  Chest pain Appears related to recurrent emesis. Possible rib fracture/contusion. Reproducible pain. EKG unchanged. Portable chest x-ray suggests some infiltrate but is difficult to delineate. Ordered 2 view chest x-ray confirms infiltrate. Patient without pneumonia symptoms or signs. Procalcitonin undetectable.  DVT prophylaxis: Lovenox Code Status:   Code Status: Full Code Family Communication: None at bedside Disposition Plan: Discharge home hopefully in 24 hours if continues to tolerate diet on oral Zofran   Consultants:   Gastroenterology  Procedures:   UPPER ENDOSCOPY (08/12/2019)  Antimicrobials:  None    Subjective: Feeling better today. Not vomiting as much.  Objective: Vitals:   08/13/20 2356 08/14/20 0429 08/14/20 1317 08/14/20 1400  BP: 120/61 133/69 114/72 113/69  Pulse: 91 99 91 100  Resp: 20 20 (!) 22 20  Temp: 99.6 F (37.6 C) 99.7 F (37.6 C) 97.9 F (36.6 C) 98.6 F (37 C)  TempSrc: Oral Oral Oral Oral  SpO2: 97% 95% 97% 96%  Weight:      Height:        Intake/Output Summary (Last 24 hours) at 08/14/2020 1410 Last data filed at 08/14/2020 0548 Gross per 24 hour  Intake 2666.06 ml  Output --  Net 2666.06 ml   Filed Weights   08/09/20 0049 08/11/20 1323  Weight: 82.3 kg 82.3 kg    Examination:  General exam: Appears calm and comfortable Respiratory system: Clear to auscultation. Respiratory effort normal. Cardiovascular system: S1 & S2 heard, RRR. No murmurs, rubs, gallops or clicks. Gastrointestinal system: Abdomen is nondistended, soft and nontender. No organomegaly or masses felt. Normal bowel sounds heard. Central nervous system: Alert and oriented. No focal neurological deficits. Musculoskeletal: No edema. No calf tenderness Skin: No cyanosis. No rashes  Psychiatry: Judgement and insight appear normal. Mood & affect appropriate.   Data Reviewed:  I have personally reviewed following labs and imaging studies  CBC Lab Results  Component Value Date   WBC 7.9 08/12/2020   RBC 3.38 (L) 08/12/2020   HGB 10.2 (L) 08/12/2020   HCT 30.6 (L) 08/12/2020   MCV 90.5 08/12/2020   MCH 30.2 08/12/2020   PLT 225 08/12/2020   MCHC 33.3 08/12/2020   RDW 12.1 08/12/2020   LYMPHSABS 1.6 06/08/2020   MONOABS 0.3 06/08/2020   EOSABS 0.1 06/08/2020   BASOSABS 0.0 05/39/7673     Last metabolic panel Lab Results  Component Value Date   NA 138 08/14/2020   K 3.5 08/14/2020   CL 106 08/14/2020   CO2 24 08/14/2020   BUN 9 08/14/2020   CREATININE 1.18 (H) 08/14/2020   GLUCOSE 118 (H) 08/14/2020   GFRNONAA 56 (L) 08/14/2020   GFRAA 102 01/31/2020   CALCIUM 7.2 (L) 08/14/2020   PHOS 3.3 10/13/2019   PROT 4.6 (L) 08/09/2020   ALBUMIN 2.0 (L) 08/09/2020   LABGLOB 2.5 11/24/2019   AGRATIO 1.2 11/24/2019   BILITOT 0.7 08/09/2020   ALKPHOS 50 08/09/2020   AST 14 (L) 08/09/2020   ALT 15 08/09/2020   ANIONGAP 8 08/14/2020    CBG (last 3)  Recent Labs    08/14/20 0421 08/14/20 0831 08/14/20 1315  GLUCAP 86 126* 134*     GFR: Estimated Creatinine Clearance: 65.8 mL/min (A) (by C-G formula based on SCr of 1.18 mg/dL (H)).  Coagulation Profile: No results for input(s): INR, PROTIME in the last 168 hours.  Recent Results (from the past 240 hour(s))  SARS CORONAVIRUS 2 (TAT 6-24 HRS) Nasopharyngeal Nasopharyngeal Swab     Status: None   Collection Time: 08/10/20  5:30 PM   Specimen: Nasopharyngeal Swab  Result Value Ref Range Status   SARS Coronavirus 2 NEGATIVE NEGATIVE Final    Comment: (NOTE) SARS-CoV-2 target nucleic acids are NOT DETECTED.  The SARS-CoV-2 RNA is generally detectable in upper and lower respiratory specimens during the acute phase of infection. Negative results do not preclude SARS-CoV-2 infection, do not rule out co-infections with other pathogens, and should not be used as the sole basis for treatment or  other patient management decisions. Negative results must be combined with clinical observations, patient history, and epidemiological information. The expected result is Negative.  Fact Sheet for Patients: SugarRoll.be  Fact Sheet for Healthcare Providers: https://www.woods-mathews.com/  This test is not yet approved or cleared by the [place] FDA and  has been authorized for detection and/or diagnosis of SARS-CoV-2 by FDA under an Emergency Use Authorization (EUA). This EUA will remain  in effect (meaning this test can be used) for the duration of the COVID-19 declaration under Se ction 564(b)(1) of the Act, 21 U.S.C. section 360bbb-3(b)(1), unless the authorization is terminated or revoked sooner.  Performed at [facility], [place] [address] [place], [place] [address]         Radiology Studies: CT CHEST WO CONTRAST  Result Date: 08/14/2020 CLINICAL DATA:  Respiratory illness EXAM: CT CHEST WITHOUT CONTRAST TECHNIQUE: Multidetector CT imaging of the chest was performed following the standard protocol without IV contrast. COMPARISON:  Chest radiographs, 08/12/2020 FINDINGS: Cardiovascular: No significant vascular findings. Normal heart size. No pericardial effusion. Mediastinum/Nodes: No enlarged mediastinal, hilar, or axillary lymph nodes. Thyroid gland, trachea, and esophagus demonstrate no significant findings. Lungs/Pleura: Moderate bilateral pleural effusions and associated atelectasis or consolidation. There is  mild interlobular septal thickening. Minimal biapical pleuroparenchymal scarring. Upper Abdomen: No acute abnormality. Small volume ascites in the included upper abdomen. Musculoskeletal: No chest wall mass or suspicious bone lesions identified. IMPRESSION: 1. Moderate bilateral pleural effusions and associated atelectasis or consolidation. 2.  Mild interlobular septal thickening, consistent with edema. 3.  Small volume  ascites in the included upper abdomen. Electronically Signed   By: Eddie Candle M.D.   On: 08/14/2020 08:17        Scheduled Meds: . carvedilol  6.25 mg Oral BID WC  . insulin aspart  0-15 Units Subcutaneous TID WC  . insulin aspart  0-5 Units Subcutaneous QHS  . insulin detemir  10 Units Subcutaneous Daily  . ondansetron (ZOFRAN) IV  4 mg Intravenous Q6H  . pantoprazole (PROTONIX) IV  40 mg Intravenous Q12H  . promethazine  12.5 mg Intravenous QID  . sucralfate  1 g Oral TID AC & HS   Continuous Infusions:    LOS: 5 days     Cordelia Poche, MD [facility] 08/14/2020, 2:10 PM  If 7PM-7AM, please contact night-coverage [email or web address]

## 2020-08-15 ENCOUNTER — Encounter: Payer: Self-pay | Admitting: Gastroenterology

## 2020-08-15 LAB — BASIC METABOLIC PANEL
Anion gap: 8 (ref 5–15)
BUN: 9 mg/dL (ref 6–20)
CO2: 27 mmol/L (ref 22–32)
Calcium: 7.3 mg/dL — ABNORMAL LOW (ref 8.9–10.3)
Chloride: 101 mmol/L (ref 98–111)
Creatinine, Ser: 1.38 mg/dL — ABNORMAL HIGH (ref 0.44–1.00)
GFR, Estimated: 47 mL/min — ABNORMAL LOW (ref >60)
Glucose, Bld: 157 mg/dL — ABNORMAL HIGH (ref 70–99)
Potassium: 3.5 mmol/L (ref 3.5–5.1)
Sodium: 136 mmol/L (ref 135–145)

## 2020-08-15 LAB — CBC
HCT: 30.1 % — ABNORMAL LOW (ref 36.0–46.0)
Hemoglobin: 9.7 g/dL — ABNORMAL LOW (ref 12.0–15.0)
MCH: 29.8 pg (ref 26.0–34.0)
MCHC: 32.2 g/dL (ref 30.0–36.0)
MCV: 92.3 fL (ref 80.0–100.0)
Platelets: UNDETERMINED 10*3/uL (ref 150–400)
RBC: 3.26 MIL/uL — ABNORMAL LOW (ref 3.87–5.11)
RDW: 12.3 % (ref 11.5–15.5)
WBC: 10.6 10*3/uL — ABNORMAL HIGH (ref 4.0–10.5)
nRBC: 0 % (ref 0.0–0.2)

## 2020-08-15 LAB — GLUCOSE, CAPILLARY
Glucose-Capillary: 126 mg/dL — ABNORMAL HIGH (ref 70–99)
Glucose-Capillary: 142 mg/dL — ABNORMAL HIGH (ref 70–99)
Glucose-Capillary: 242 mg/dL — ABNORMAL HIGH (ref 70–99)
Glucose-Capillary: 205 mg/dL — ABNORMAL HIGH (ref 70–99)

## 2020-08-15 LAB — PROCALCITONIN: Procalcitonin: 0.14 ng/mL

## 2020-08-15 MED ORDER — FUROSEMIDE 10 MG/ML IJ SOLN
40.0000 mg | Freq: Once | INTRAMUSCULAR | Status: AC
  Administered 2020-08-15: 40 mg via INTRAVENOUS
  Filled 2020-08-15: qty 4

## 2020-08-15 MED ORDER — PANTOPRAZOLE SODIUM 40 MG PO TBEC
40.0000 mg | DELAYED_RELEASE_TABLET | Freq: Two times a day (BID) | ORAL | Status: DC
  Administered 2020-08-15 – 2020-08-16 (×3): 40 mg via ORAL
  Filled 2020-08-15 (×3): qty 1

## 2020-08-15 MED ORDER — GLUCERNA SHAKE PO LIQD
237.0000 mL | Freq: Two times a day (BID) | ORAL | Status: DC
  Administered 2020-08-15 – 2020-08-16 (×2): 237 mL via ORAL
  Filled 2020-08-15 (×3): qty 237

## 2020-08-15 MED ORDER — ENOXAPARIN SODIUM 40 MG/0.4ML ~~LOC~~ SOLN
40.0000 mg | SUBCUTANEOUS | Status: DC
  Administered 2020-08-15: 40 mg via SUBCUTANEOUS
  Filled 2020-08-15: qty 0.4

## 2020-08-15 NOTE — Progress Notes (Signed)
Chaplain engaged in follow-up visit with [facility], continuing to offer support.  Soul shared that she has been feeling weak and lightheaded when she gets up to walk.  Chaplain offered presence and affirmed the ways in which she knows her body and response to certain medicines.  Chaplain will follow-up as needed.    08/15/20 1200  Clinical Encounter Type  Visited With Patient  Visit Type Follow-up

## 2020-08-15 NOTE — Evaluation (Signed)
Physical Therapy Evaluation Patient Details Name: [name] MRN: [id number] DOB: [birth year] Today's Date: 08/15/2020   History of Present Illness  50 yo female admitted to ED on 1/3 with abdominal pain, n/v. Upper GI endoscopy reveals grade C esophagitis with no active bleeding, erythematous mucosa without bleeding, and biopsied gastric polyps. Pt also with possible rib fractures/contusions due to frequent vomiting. PMH includes DM I with neuropathy, anxiety, HF, depression.  Clinical Impression   Pt presents with generalized weakness, impaired standing balance vs baseline, and decreased activity tolerance. Pt to benefit from acute PT to address deficits. Pt ambulated hallway distance with close guard for safety, along with RW for self-steadying. PT recommending RW and per pt she will progress activity at home with assist of family, declines HHPT. Pt encouraged to be up and moving with RN staff 3x/day at least while in acute setting. PT to progress mobility as tolerated, and will continue to follow acutely.      Follow Up Recommendations Supervision for mobility/OOB    Equipment Recommendations  Rolling walker with 5" wheels    Recommendations for Other Services       Precautions / Restrictions Precautions Precautions: Fall (moderate) Restrictions Weight Bearing Restrictions: No      Mobility  Bed Mobility Overal bed mobility: Needs Assistance Bed Mobility: Supine to Sit;Sit to Supine     Supine to sit: Supervision Sit to supine: Supervision   General bed mobility comments: increased time and effort    Transfers Overall transfer level: Needs assistance Equipment used: None Transfers: Sit to/from Stand Sit to Stand: Min guard         General transfer comment: for safety, slow to rise and steady. Reaching for environment to self steady  Ambulation/Gait Ambulation/Gait assistance: Min guard Gait Distance (Feet): 45 Feet Assistive device: Rolling  walker (2 wheeled) Gait Pattern/deviations: Step-through pattern;Decreased stride length;Trunk flexed Gait velocity: decr   General Gait Details: Min guard for safety, slow and unsteady but benefits from use of RW. Verbal cuing for upright posture.  Stairs            Wheelchair Mobility    Modified Rankin (Stroke Patients Only)       Balance Overall balance assessment: Needs assistance Sitting-balance support: No upper extremity supported;Feet supported Sitting balance-Leahy Scale: Good     Standing balance support: During functional activity;No upper extremity supported Standing balance-Leahy Scale: Fair Standing balance comment: statically stands without UE support, requires RW for ambulation                             Pertinent Vitals/Pain Pain Assessment: No/denies pain    Home Living Family/patient expects to be discharged to:: Private residence Living Arrangements: Children Available Help at Discharge: Family Type of Home: House Home Access: Stairs to enter   Technical brewer of Steps: 3 Home Layout: One level Home Equipment: None      Prior Function Level of Independence: Independent         Comments: 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    Extremity/Trunk Assessment   Upper Extremity Assessment Upper Extremity Assessment: Defer to OT evaluation    Lower Extremity Assessment Lower Extremity Assessment: Generalized weakness    Cervical / Trunk Assessment Cervical / Trunk Assessment: Normal  Communication   Communication: No difficulties  Cognition Arousal/Alertness: Awake/alert Behavior During Therapy: WFL for tasks assessed/performed Overall Cognitive  Status: Within Functional Limits for tasks assessed                                        General Comments      Exercises     Assessment/Plan    PT Assessment Patient needs continued PT services   PT Problem List Decreased strength;Decreased mobility;Decreased activity tolerance;Decreased balance;Decreased knowledge of use of DME;Pain;Cardiopulmonary status limiting activity;Decreased safety awareness       PT Treatment Interventions DME instruction;Therapeutic activities;Gait training;Therapeutic exercise;Patient/family education;Balance training;Stair training;Functional mobility training;Neuromuscular re-education    PT Goals (Current goals can be found in the Care Plan section)  Acute Rehab PT Goals PT Goal Formulation: With patient Time For Goal Achievement: 08/29/20 Potential to Achieve Goals: Good    Frequency Min 3X/week   Barriers to discharge        Co-evaluation               AM-PAC PT "6 Clicks" Mobility  Outcome Measure Help needed turning from your back to your side while in a flat bed without using bedrails?: A Little Help needed moving from lying on your back to sitting on the side of a flat bed without using bedrails?: A Little Help needed moving to and from a bed to a chair (including a wheelchair)?: A Little Help needed standing up from a chair using your arms (e.g., wheelchair or bedside chair)?: A Little Help needed to walk in hospital room?: A Little Help needed climbing 3-5 steps with a railing? : A Little 6 Click Score: 18    End of Session   Activity Tolerance: Patient tolerated treatment well;Patient limited by fatigue Patient left: in bed;with call bell/phone within reach Nurse Communication: Mobility status PT Visit Diagnosis: Other abnormalities of gait and mobility (R26.89);Difficulty in walking, not elsewhere classified (R26.2)    Time: 5790-3833 PT Time Calculation (min) (ACUTE ONLY): 12 min   Charges:   PT Evaluation $PT Eval Low Complexity: 1 Low         Karmyn Lowman S, PT Acute Rehabilitation Services Pager [phone]  Office [phone]   Louis Matte 08/15/2020, 6:15 PM

## 2020-08-15 NOTE — Progress Notes (Signed)
PROGRESS NOTE    [name]  GGY:[id number] DOB: May 11, 1971 DOA: 08/08/2020 PCP: [name], PA-C   Brief Narrative: [name] is a 50 y.o. female with a history of diabetes type 1 on insulin pump, diastolic heart failure, foot ulcer, diabetic neuropathy, migraine headaches. Patient presented secondary nausea and vomiting requiring IV antiemetics.   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 Previous workup for gastroparesis in 2020 significant for a normal gastric emptying study. No recent illness symptoms. No associated diarrhea. Patient with persistent nausea with vomiting and retching which has improved slightly. EGD performed on 1/7 without etiology found. -Continue diet -Zofran ODT; continue phenergan IV prn for refractory symptoms -GI recommendations: Continue antiemetics -PT/OT eval for significant weakness/lightheadedness  Erosive esophagitis Seen on EGD. Biopsies obtained. Negative for H. Pylori. Reactive gastropathy and mild chronic gastritis also seen. -GI recommendations: Protonix, Tums and Carafate suspension  Diabetes mellitus, type 1 Patient manages with an insulin pump for which stopped working two days prior. No evidence of DKA on admission labs. Allergy to Lantus listed. -Continue Levemir 10 units -SSI qAC/HS  Chronic diastolic heart failure Patient is on torsemide as an outpatient. Patient is up 7 L but in/out not accurately documented as patient has had significant emesis volume in addition to unmeasured urine. No edema. -Diuretic as mentioned below -Watch fluid balance  Bilateral pleural effusion In setting of  significant fluid resuscitation. Asymptomatic. -If able to obtain new IV and if BP allows, will give Lasix 40 mg IV x1  Primary hypertension Patient is on Coreg 6.$RemoveBe'25mg'dhMVqbsaO$  BID as an outpatient. Uncontrolled. -Continue Coreg  Hyperlipidemia Patient is on Lipitor.  Chest pain Appears related to recurrent emesis. Possible rib fracture/contusion. Reproducible pain. EKG unchanged. Portable chest x-ray suggests some infiltrate but is difficult to delineate. Ordered 2 view chest x-ray confirms infiltrate. Patient without pneumonia symptoms or signs. Procalcitonin undetectable.  Fever Unknown etiology. Possibly related to external temperatures as fever is mild. No invasive lines. Patient without symptoms. Chest infiltrate that is non-specific. No leukocytosis. -Repeat procalcitonin, CBC, BMP  DVT prophylaxis: Lovenox Code Status:   Code Status: Full Code Family Communication: None at bedside Disposition Plan: Discharge home hopefully in 24 hours if continues to tolerate diet on oral Zofran   Consultants:   Gastroenterology  Procedures:   UPPER ENDOSCOPY (08/12/2019)  Antimicrobials:  None    Subjective: Emesis is improved. Significant lightheadedness on standing, however.  Objective: Vitals:   08/14/20 1904 08/14/20 2034 08/15/20 0544 08/15/20 1031  BP: 116/66 114/75 135/75 (!) 104/57  Pulse: 93 98 96 86  Resp: $Remo'16 16 16 20  'hqLPH$ Temp: 99.2 F (37.3 C) (!) 100.4 F (38 C) 99.4 F (37.4 C) 98.4 F (36.9 C)  TempSrc: Oral Oral Oral Oral  SpO2: 96% 90% 94% 96%  Weight:      Height:       No intake or output data in the 24 hours ending 08/15/20 1538 Filed Weights   08/09/20 0049 08/11/20 1323  Weight: 82.3 kg 82.3 kg    Examination:  General exam: Appears calm and comfortable Respiratory system: Clear to auscultation. Respiratory effort normal. Cardiovascular system: S1 & S2 heard, RRR. No murmurs, rubs, gallops  or clicks. Gastrointestinal system: Abdomen is nondistended but  full, soft and nontender. No organomegaly or masses felt. Normal bowel sounds heard. Central nervous system: Alert and oriented. No focal neurological deficits. Musculoskeletal: No edema. No calf tenderness Skin: No cyanosis. No rashes Psychiatry: Judgement and insight appear normal. Mood & affect appropriate.   Data Reviewed: I have personally reviewed following labs and imaging studies  CBC Lab Results  Component Value Date   WBC 10.6 (H) 08/15/2020   RBC 3.26 (L) 08/15/2020   HGB 9.7 (L) 08/15/2020   HCT 30.1 (L) 08/15/2020   MCV 92.3 08/15/2020   MCH 29.8 08/15/2020   PLT PLATELET CLUMPS NOTED ON SMEAR, UNABLE TO ESTIMATE 08/15/2020   MCHC 32.2 08/15/2020   RDW 12.3 08/15/2020   LYMPHSABS 1.6 06/08/2020   MONOABS 0.3 06/08/2020   EOSABS 0.1 06/08/2020   BASOSABS 0.0 16/05/9603     Last metabolic panel Lab Results  Component Value Date   NA 136 08/15/2020   K 3.5 08/15/2020   CL 101 08/15/2020   CO2 27 08/15/2020   BUN 9 08/15/2020   CREATININE 1.38 (H) 08/15/2020   GLUCOSE 157 (H) 08/15/2020   GFRNONAA 47 (L) 08/15/2020   GFRAA 102 01/31/2020   CALCIUM 7.3 (L) 08/15/2020   PHOS 3.3 10/13/2019   PROT 4.6 (L) 08/09/2020   ALBUMIN 2.0 (L) 08/09/2020   LABGLOB 2.5 11/24/2019   AGRATIO 1.2 11/24/2019   BILITOT 0.7 08/09/2020   ALKPHOS 50 08/09/2020   AST 14 (L) 08/09/2020   ALT 15 08/09/2020   ANIONGAP 8 08/15/2020    CBG (last 3)  Recent Labs    08/14/20 2158 08/15/20 0749 08/15/20 1207  GLUCAP 139* 126* 142*     GFR: Estimated Creatinine Clearance: 56.3 mL/min (A) (by C-G formula based on SCr of 1.38 mg/dL (H)).  Coagulation Profile: No results for input(s): INR, PROTIME in the last 168 hours.  Recent Results (from the past 240 hour(s))  SARS CORONAVIRUS 2 (TAT 6-24 HRS) Nasopharyngeal Nasopharyngeal Swab     Status: None   Collection Time: 08/10/20  5:30 PM   Specimen: Nasopharyngeal Swab  Result Value Ref Range Status   SARS Coronavirus 2  NEGATIVE NEGATIVE Final    Comment: (NOTE) SARS-CoV-2 target nucleic acids are NOT DETECTED.  The SARS-CoV-2 RNA is generally detectable in upper and lower respiratory specimens during the acute phase of infection. Negative results do not preclude SARS-CoV-2 infection, do not rule out co-infections with other pathogens, and should not be used as the sole basis for treatment or other patient management decisions. Negative results must be combined with clinical observations, patient history, and epidemiological information. The expected result is Negative.  Fact Sheet for Patients: SugarRoll.be  Fact Sheet for Healthcare Providers: https://www.woods-mathews.com/  This test is not yet approved or cleared by the [place] FDA and  has been authorized for detection and/or diagnosis of SARS-CoV-2 by FDA under an Emergency Use Authorization (EUA). This EUA will remain  in effect (meaning this test can be used) for the duration of the COVID-19 declaration under Se ction 564(b)(1) of the Act, 21 U.S.C. section 360bbb-3(b)(1), unless the authorization is terminated or revoked sooner.  Performed at [facility], [place] [address], [place], [place] [address]         Radiology Studies: CT CHEST WO CONTRAST  Result Date: 08/14/2020 CLINICAL DATA:  Respiratory illness EXAM: CT CHEST WITHOUT CONTRAST TECHNIQUE: Multidetector CT imaging of the chest was performed following the standard protocol  without IV contrast. COMPARISON:  Chest radiographs, 08/12/2020 FINDINGS: Cardiovascular: No significant vascular findings. Normal heart size. No pericardial effusion. Mediastinum/Nodes: No enlarged mediastinal, hilar, or axillary lymph nodes. Thyroid gland, trachea, and esophagus demonstrate no significant findings. Lungs/Pleura: Moderate bilateral pleural effusions and associated atelectasis or consolidation. There is mild interlobular septal thickening.  Minimal biapical pleuroparenchymal scarring. Upper Abdomen: No acute abnormality. Small volume ascites in the included upper abdomen. Musculoskeletal: No chest wall mass or suspicious bone lesions identified. IMPRESSION: 1. Moderate bilateral pleural effusions and associated atelectasis or consolidation. 2.  Mild interlobular septal thickening, consistent with edema. 3.  Small volume ascites in the included upper abdomen. Electronically Signed   By: Eddie Candle M.D.   On: 08/14/2020 08:17        Scheduled Meds: . carvedilol  6.25 mg Oral BID WC  . enoxaparin (LOVENOX) injection  40 mg Subcutaneous Q24H  . insulin aspart  0-15 Units Subcutaneous TID WC  . insulin aspart  0-5 Units Subcutaneous QHS  . insulin detemir  10 Units Subcutaneous Daily  . ondansetron  4 mg Oral Q6H  . pantoprazole  40 mg Oral BID  . promethazine  12.5 mg Intravenous QID  . sucralfate  1 g Oral TID AC & HS  . torsemide  20 mg Oral Q M,W,F   Continuous Infusions:    LOS: 6 days     Cordelia Poche, MD [facility] 08/15/2020, 3:38 PM  If 7PM-7AM, please contact night-coverage [email or web address]

## 2020-08-16 LAB — PROCALCITONIN: Procalcitonin: 0.11 ng/mL

## 2020-08-16 LAB — CBC
HCT: 24.9 % — ABNORMAL LOW (ref 36.0–46.0)
Hemoglobin: 8.1 g/dL — ABNORMAL LOW (ref 12.0–15.0)
MCH: 30.1 pg (ref 26.0–34.0)
MCHC: 32.5 g/dL (ref 30.0–36.0)
MCV: 92.6 fL (ref 80.0–100.0)
Platelets: 162 10*3/uL (ref 150–400)
RBC: 2.69 MIL/uL — ABNORMAL LOW (ref 3.87–5.11)
RDW: 12.3 % (ref 11.5–15.5)
WBC: 7.3 10*3/uL (ref 4.0–10.5)
nRBC: 0 % (ref 0.0–0.2)

## 2020-08-16 LAB — GLUCOSE, CAPILLARY
Glucose-Capillary: 233 mg/dL — ABNORMAL HIGH (ref 70–99)
Glucose-Capillary: 222 mg/dL — ABNORMAL HIGH (ref 70–99)

## 2020-08-16 LAB — BASIC METABOLIC PANEL
Anion gap: 8 (ref 5–15)
BUN: 14 mg/dL (ref 6–20)
CO2: 26 mmol/L (ref 22–32)
Calcium: 6.8 mg/dL — ABNORMAL LOW (ref 8.9–10.3)
Chloride: 103 mmol/L (ref 98–111)
Creatinine, Ser: 1.38 mg/dL — ABNORMAL HIGH (ref 0.44–1.00)
GFR, Estimated: 47 mL/min — ABNORMAL LOW (ref >60)
Glucose, Bld: 247 mg/dL — ABNORMAL HIGH (ref 70–99)
Potassium: 3.4 mmol/L — ABNORMAL LOW (ref 3.5–5.1)
Sodium: 137 mmol/L (ref 135–145)

## 2020-08-16 MED ORDER — PROMETHAZINE HCL 12.5 MG PO TABS: 12.5000 mg | ORAL_TABLET | Freq: Four times a day (QID) | ORAL | 0 refills | Status: DC

## 2020-08-16 MED ORDER — PANTOPRAZOLE SODIUM 40 MG PO TBEC: 40.0000 mg | DELAYED_RELEASE_TABLET | Freq: Two times a day (BID) | ORAL | 0 refills | Status: DC

## 2020-08-16 MED ORDER — SUCRALFATE 1 GM/10ML PO SUSP
1.0000 g | Freq: Three times a day (TID) | ORAL | 0 refills | Status: DC
Start: 2020-08-16 — End: 2020-08-26

## 2020-08-16 MED ORDER — ONDANSETRON 4 MG PO TBDP: 4.0000 mg | ORAL_TABLET | Freq: Four times a day (QID) | ORAL | 0 refills | Status: DC

## 2020-08-16 MED ORDER — GLUCERNA SHAKE PO LIQD: 237.0000 mL | Freq: Two times a day (BID) | ORAL | 0 refills | Status: DC

## 2020-08-16 MED ORDER — PROMETHAZINE HCL 25 MG PO TABS: 12.5000 mg | ORAL_TABLET | Freq: Four times a day (QID) | ORAL | Status: DC

## 2020-08-16 NOTE — TOC Transition Note (Signed)
Transition of Care [facility]) - CM/SW Discharge Note   Patient Details  Name: [name] MRN: [id number] Date of Birth: Dec 06, 1970  Transition of Care [facility]) CM/SW Contact:  Lynnell Catalan, RN Phone Number: 08/16/2020, 10:08 AM   Clinical Narrative:     [facility] consult for trouble with medications. Pt states that she doesn't have trouble with getting her medications she just has trouble managing keeping up with all her doctors. She states she has spoken with her doctor about getting some back up insulin for if her pump malfunctions and she is going to make sure she has a plan in place so she doesn't end up in the hospital again. TOC expressed importance of close follow up with her outpatient physicians.

## 2020-08-16 NOTE — Discharge Summary (Addendum)
Physician Discharge Summary  [name] ALP:[id number] DOB: Dec 11, 1970 DOA: 08/08/2020  PCP: [name], PA-C  Admit date: 08/08/2020 Discharge date: 08/16/2020  Admitted From: Home Disposition: Home  Recommendations for Outpatient Follow-up:  1. Follow up with PCP in 1 week 2. Follow up with GI in 3 weeks 3. Repeat chest x-ray in 2-3 days 4. Please follow up on the following pending results: None  Home Health: None Equipment/Devices: None  Discharge Condition: Stable CODE STATUS: Full code Diet recommendation: Soft diet   Brief/Interim Summary:  Admission HPI written by Elwyn Reach, MD   Chief Complaint: Nausea with vomiting  HPI: [name] is a 50 y.o. female with medical history significant of type 1 diabetes on insulin pump, diastolic CHF, mood disorder, diabetic foot ulcer, diabetic neuropathy, migraine headaches who presented to the ER with intractable nausea and vomiting.  Patient reported that her insulin pump needle came out and she could not get her insulin.  She did not give herself any injection.  She has significant hyperglycemia with blood glucose in the 300s but had normal anion gap.  No DKA.  Patient was treated in the ER symptomatically but continued to vomit and unable to control it.  She also had some weakness and appears of some dehydration.  Patient therefore being admitted to the hospital at this point for symptomatic management.  She had previous episode of nausea vomiting.  In August 2020 she was evaluated for possible gastroparesis and the study was normal.  Patient being admitted for observation and symptomatic control today.  Hospital course:  Intractable nausea and vomiting Unknown etiology. Previous workup for gastroparesis in 2020 significant for a normal gastric emptying study. No recent illness symptoms. No associated diarrhea. Patient with persistent nausea with vomiting and retching which has improved  slightly. EGD performed on 1/7 without etiology found but did find erosive esophagitis. Zofran and phenergan scheduled. Outpatient GI follow-up.  Erosive esophagitis Seen on EGD. Biopsies obtained. Negative for H. Pylori. Reactive gastropathy and mild chronic gastritis also seen. GI started Protonix BID and Carafate suspension.  Diabetes mellitus, type 1 Patient manages with an insulin pump for which stopped working two days prior. No evidence of DKA on admission labs. Allergy to Lantus listed. Managed with Levemir while inpatient. On dishcarge, patient states her insulin pump issues are resolved. Resume home insulin regimen.  Chronic diastolic heart failure Patient is on torsemide as an outpatient. Patient is up 7 L but in/out not accurately documented as patient has had significant emesis volume in addition to unmeasured urine. No edema. Restarted home torsemide and give a one time dose of Lasix 40 mg IV.  Bilateral pleural effusion In setting of significant fluid resuscitation. Asymptomatic. Given Lasix 40 mg IV x1. Will need repeat chest x-ray in 2-3 weeks  Primary hypertension Patient is on Coreg 6.$RemoveBe'25mg'nkWwgYfem$  BID as an outpatient. Uncontrolled. Continue Coreg.  Hyperlipidemia Patient is on Lipitor.  Chest pain Appears related to recurrent emesis. Possible rib fracture/contusion. Reproducible pain. EKG unchanged. Portable chest x-ray suggests some infiltrate but is difficult to delineate. Ordered 2 view chest x-ray confirms infiltrate. Patient without pneumonia symptoms or signs. Procalcitonin undetectable.  Fever Unknown etiology. Possibly related to external temperatures as fever is mild. No invasive lines. Patient without symptoms. Chest infiltrate that is non-specific. No leukocytosis.  Discharge Diagnoses:  Principal Problem:   Intractable nausea and vomiting Active Problems:   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)   Mood disorder ([place])  Diabetic foot  ulcers (HCC)   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[place])   Gastric polyps   Generalized abdominal pain    Discharge Instructions  Discharge Instructions    Call MD for:  severe uncontrolled pain   Complete by: As directed    Call MD for:  temperature >100.4   Complete by: As directed    Increase activity slowly   Complete by: As directed      Allergies as of 08/16/2020      Reactions   Amoxicillin Itching   Dulaglutide    Other reaction(s): vomiting   Miconazole Nitrate    Other reaction(s): rash   Lantus [insulin Glargine] Itching, Rash      Medication List    TAKE these medications   albuterol 108 (90 Base) MCG/ACT inhaler Commonly known as: ProAir HFA Inhale 2 puffs into the lungs every 6 (six) hours as needed for wheezing or shortness of breath.   atorvastatin 10 MG tablet Commonly known as: LIPITOR Take 10 mg by mouth daily.   Betamethasone Valerate 0.12 % foam Apply thin layer topically twice daily to areas of hair loss.   carvedilol 6.25 MG tablet Commonly known as: COREG Take 1 tablet (6.25 mg total) by mouth 2 (two) times daily.   feeding supplement (GLUCERNA SHAKE) Liqd Take 237 mLs by mouth 2 (two) times daily between meals.   FreeStyle Libre 2 Sensor Misc CHANGE EVERY 14 DAYS   hydrOXYzine 25 MG tablet Commonly known as: ATARAX/VISTARIL Take 1 tablet (25 mg total) by mouth 3 (three) times daily as needed. What changed: reasons to take this   LORazepam 0.5 MG tablet Commonly known as: ATIVAN TAKE 1 TABLET BY MOUTH EVERY 8 HOURS AS NEEDED FOR ANXIETY. What changed:   reasons to take this  additional instructions   NovoLOG 100 UNIT/ML injection Generic drug: insulin aspart SMARTSIG:0-120 Unit(s) SUB-Q Daily   OmniPod 10 Pack Misc by Does not apply route.   ondansetron 4 MG disintegrating tablet Commonly known as: ZOFRAN-ODT Take 1 tablet (4 mg total) by mouth every 6 (six) hours. What changed:   when to take this  reasons to take  this   pantoprazole 40 MG tablet Commonly known as: PROTONIX Take 1 tablet (40 mg total) by mouth 2 (two) times daily.   promethazine 12.5 MG tablet Commonly known as: PHENERGAN Take 1 tablet (12.5 mg total) by mouth every 6 (six) hours.   sucralfate 1 GM/10ML suspension Commonly known as: CARAFATE Take 10 mLs (1 g total) by mouth 4 (four) times daily -  before meals and at bedtime.   torsemide 20 MG tablet Commonly known as: DEMADEX TAKE 1 TABLET (20 MG TOTAL) BY MOUTH EVERY MONDAY, WEDNESDAY, AND FRIDAY.       Follow-up Information    [name], PA-C. Schedule an appointment as soon as possible for a visit in 1 week(s).   Specialty: Family Medicine Why: Hospital follow-up Contact information: [address] [place] [address] [place] [address] [place], [place]. Schedule an appointment as soon as possible for a visit in 3 week(s).   Specialty: Gastroenterology Why: Hospital follow-up Contact information: [address] [place] [address] [phone]              Allergies  Allergen Reactions  . Amoxicillin Itching  . Dulaglutide     Other reaction(s): vomiting  . Miconazole Nitrate     Other reaction(s):  rash  . Lantus [Insulin Glargine] Itching and Rash    Consultations:  [facility]   Procedures/Studies: DG Chest 2 View  Result Date: 08/12/2020 CLINICAL DATA:  Chest pain, recurrent emesis. Possible rib fracture/contusion EXAM: CHEST - 2 VIEW COMPARISON:  Chest x-ray dated 08/11/2020. FINDINGS: Heart size and mediastinal contours are within normal limits. Patchy opacities within the LEFT lower lung similar to the recent chest x-ray. Small LEFT pleural effusion. Mild wedging of several midthoracic vertebral bodies, likely chronic. Osseous structures about the chest are otherwise unremarkable. IMPRESSION: 1. Patchy opacities within the LEFT lower lung, suspicious for pneumonia, similar to yesterday's chest  x-ray. 2. Small LEFT pleural effusion. 3. No rib fracture or displacement is seen. Electronically Signed   By: Franki Cabot M.D.   On: 08/12/2020 12:20   CT CHEST WO CONTRAST  Result Date: 08/14/2020 CLINICAL DATA:  Respiratory illness EXAM: CT CHEST WITHOUT CONTRAST TECHNIQUE: Multidetector CT imaging of the chest was performed following the standard protocol without IV contrast. COMPARISON:  Chest radiographs, 08/12/2020 FINDINGS: Cardiovascular: No significant vascular findings. Normal heart size. No pericardial effusion. Mediastinum/Nodes: No enlarged mediastinal, hilar, or axillary lymph nodes. Thyroid gland, trachea, and esophagus demonstrate no significant findings. Lungs/Pleura: Moderate bilateral pleural effusions and associated atelectasis or consolidation. There is mild interlobular septal thickening. Minimal biapical pleuroparenchymal scarring. Upper Abdomen: No acute abnormality. Small volume ascites in the included upper abdomen. Musculoskeletal: No chest wall mass or suspicious bone lesions identified. IMPRESSION: 1. Moderate bilateral pleural effusions and associated atelectasis or consolidation. 2.  Mild interlobular septal thickening, consistent with edema. 3.  Small volume ascites in the included upper abdomen. Electronically Signed   By: Eddie Candle M.D.   On: 08/14/2020 08:17   CT Abdomen Pelvis W Contrast  Result Date: 08/08/2020 CLINICAL DATA:  Abdominal pain. EXAM: CT ABDOMEN AND PELVIS WITH CONTRAST TECHNIQUE: Multidetector CT imaging of the abdomen and pelvis was performed using the standard protocol following bolus administration of intravenous contrast. CONTRAST:  145mL OMNIPAQUE IOHEXOL 300 MG/ML  SOLN COMPARISON:  October 11, 2019 FINDINGS: Lower chest: No acute abnormality. Hepatobiliary: No focal liver abnormality is seen. No gallstones, gallbladder wall thickening, or biliary dilatation. Pancreas: Unremarkable. No pancreatic ductal dilatation or surrounding inflammatory  changes. Spleen: Normal in size without focal abnormality. Adrenals/Urinary Tract: Adrenal glands are unremarkable. Kidneys are normal, without renal calculi, focal lesion, or hydronephrosis. Bladder is unremarkable. Stomach/Bowel: There is a small, stable hiatal hernia. Mild diffuse distal esophageal wall thickening is noted. This represents a new finding when compared to the prior study. Appendix appears normal. No evidence of bowel dilatation. Noninflamed diverticula are seen throughout the sigmoid colon. Vascular/Lymphatic: No significant vascular findings are present. No enlarged abdominal or pelvic lymph nodes. Reproductive: A predominantly stable 2.1 cm x 2.3 cm mildly hyperdense soft tissue mass is seen within the uterus on the right. This is present on the prior exam. Other: There is a stable 2.1 cm x 1.5 cm fat containing umbilical hernia. A trace amount of posterior pelvic free fluid is noted. Musculoskeletal: No acute or significant osseous findings. IMPRESSION: 1. Small, stable hiatal hernia with additional findings which may represent mild esophagitis. 2. Sigmoid diverticulosis. 3. Stable hyperdense uterine fibroid. 4. Trace amount of posterior pelvic free fluid, likely physiologic. Electronically Signed   By: Virgina Norfolk M.D.   On: 08/08/2020 22:09   DG CHEST PORT 1 VIEW  Result Date: 08/11/2020 CLINICAL DATA:  Emesis and left-sided chest pain. Status post EGD today. EXAM: PORTABLE CHEST  1 VIEW COMPARISON:  June 12, 2020 FINDINGS: The heart, hila, and mediastinum are normal. No mediastinal air seen on this study. No pneumothorax. Haziness is seen centrally on the right in the infrahilar region. There appears to be opacity in the left retrocardiac region. No other acute abnormalities. IMPRESSION: 1. There appears to be infiltrate in the left retrocardiac region which could represent atelectasis, pneumonia, or aspiration given history. 2. Haziness in the right perihilar region could be due  to atelectasis or developing infiltrate. 3. Recommend a PA and lateral chest x-ray when the patient is able for better evaluation. Electronically Signed   By: Dorise Bullion III M.D   On: 08/11/2020 15:05      UPPER ENDOSCOPY (08/12/2019) Impression:               - LA Grade C reflux esophagitis with no bleeding.                           - Focal erythematous mucosa in the gastric fundus.                            Biopsied.                           - A few gastric polyps. Biopsied.                           - Normal duodenal bulb and second portion of the                            duodenum.  Recommendation:           - Return patient to hospital ward for ongoing care.                           - No clear cause for N/V found. LA Class C                            esophagitis could be d/t reflux and/or d/t to                            vomiting.                           - Clear liquid diet today.                           - Continue present medications including                            pantoprazole 40 mg IV bid and change to PO bid when                            N/V subsides.                           - Await pathology results.                           -  Outpatient GI follow up with Dr. Loletha Carrow.   Subjective: Feels much better today. No nausea or vomiting.   Discharge Exam: Vitals:   08/15/20 2124 08/16/20 0551  BP: 126/70 129/81  Pulse: 95 86  Resp: 14 14  Temp: 99.1 F (37.3 C) 98.3 F (36.8 C)  SpO2: 91% 97%   Vitals:   08/15/20 1642 08/15/20 1645 08/15/20 2124 08/16/20 0551  BP: 125/68 134/76 126/70 129/81  Pulse: (!) 101 99 95 86  Resp: 20 (!) $Remo'24 14 14  'FWyvu$ Temp:   99.1 F (37.3 C) 98.3 F (36.8 C)  TempSrc:   Oral Oral  SpO2: 96% 97% 91% 97%  Weight:      Height:        General: Pt is alert, awake, not in acute distress Cardiovascular: RRR, S1/S2 +, no rubs, no gallops Respiratory: CTA bilaterally, no wheezing, no rhonchi Abdominal: Soft, NT, ND, bowel  sounds + Extremities: no edema, no cyanosis    The results of significant diagnostics from this hospitalization (including imaging, microbiology, ancillary and laboratory) are listed below for reference.     Microbiology: Recent Results (from the past 240 hour(s))  SARS CORONAVIRUS 2 (TAT 6-24 HRS) Nasopharyngeal Nasopharyngeal Swab     Status: None   Collection Time: 08/10/20  5:30 PM   Specimen: Nasopharyngeal Swab  Result Value Ref Range Status   SARS Coronavirus 2 NEGATIVE NEGATIVE Final    Comment: (NOTE) SARS-CoV-2 target nucleic acids are NOT DETECTED.  The SARS-CoV-2 RNA is generally detectable in upper and lower respiratory specimens during the acute phase of infection. Negative results do not preclude SARS-CoV-2 infection, do not rule out co-infections with other pathogens, and should not be used as the sole basis for treatment or other patient management decisions. Negative results must be combined with clinical observations, patient history, and epidemiological information. The expected result is Negative.  Fact Sheet for Patients: SugarRoll.be  Fact Sheet for Healthcare Providers: https://www.woods-mathews.com/  This test is not yet approved or cleared by the [place] FDA and  has been authorized for detection and/or diagnosis of SARS-CoV-2 by FDA under an Emergency Use Authorization (EUA). This EUA will remain  in effect (meaning this test can be used) for the duration of the COVID-19 declaration under Se ction 564(b)(1) of the Act, 21 U.S.C. section 360bbb-3(b)(1), unless the authorization is terminated or revoked sooner.  Performed at [facility], [place] [address] [place], [place] [address]      Labs: BNP (last 3 results) Recent Labs    08/09/20 0148  BNP 263.7*   Basic Metabolic Panel: Recent Labs  Lab 08/10/20 0800 08/12/20 0648 08/14/20 0728 08/15/20 1208 08/16/20 0602  NA 143 140 138  136 137  K 3.6 3.3* 3.5 3.5 3.4*  CL 110 109 106 101 103  CO2 $Re'25 26 24 27 26  'XQf$ GLUCOSE 195* 116* 118* 157* 247*  BUN $Re'19 12 9 9 14  'xIM$ CREATININE 1.20* 1.08* 1.18* 1.38* 1.38*  CALCIUM 8.3* 7.7* 7.2* 7.3* 6.8*   Liver Function Tests: No results for input(s): AST, ALT, ALKPHOS, BILITOT, PROT, ALBUMIN in the last 168 hours. No results for input(s): LIPASE, AMYLASE in the last 168 hours. No results for input(s): AMMONIA in the last 168 hours. CBC: Recent Labs  Lab 08/12/20 0648 08/15/20 1208 08/16/20 0602  WBC 7.9 10.6* 7.3  HGB 10.2* 9.7* 8.1*  HCT 30.6* 30.1* 24.9*  MCV 90.5 92.3 92.6  PLT 225 PLATELET CLUMPS NOTED ON SMEAR, UNABLE TO ESTIMATE 162  Cardiac Enzymes: No results for input(s): CKTOTAL, CKMB, CKMBINDEX, TROPONINI in the last 168 hours. BNP: Invalid input(s): POCBNP CBG: Recent Labs  Lab 08/15/20 1207 08/15/20 1846 08/15/20 2125 08/16/20 0801 08/16/20 1220  GLUCAP 142* 242* 205* 233* 222*   D-Dimer No results for input(s): DDIMER in the last 72 hours. Hgb A1c No results for input(s): HGBA1C in the last 72 hours. Lipid Profile No results for input(s): CHOL, HDL, LDLCALC, TRIG, CHOLHDL, LDLDIRECT in the last 72 hours. Thyroid function studies No results for input(s): TSH, T4TOTAL, T3FREE, THYROIDAB in the last 72 hours.  Invalid input(s): FREET3 Anemia work up No results for input(s): VITAMINB12, FOLATE, FERRITIN, TIBC, IRON, RETICCTPCT in the last 72 hours. Urinalysis    Component Value Date/Time   COLORURINE YELLOW 08/08/2020 1610   APPEARANCEUR HAZY (A) 08/08/2020 1610   LABSPEC 1.016 08/08/2020 1610   PHURINE 6.0 08/08/2020 1610   GLUCOSEU >=500 (A) 08/08/2020 1610   HGBUR MODERATE (A) 08/08/2020 1610   BILIRUBINUR NEGATIVE 08/08/2020 1610   BILIRUBINUR negative 04/23/2019 1658   KETONESUR 20 (A) 08/08/2020 1610   PROTEINUR >=300 (A) 08/08/2020 1610   UROBILINOGEN 0.2 04/23/2019 1658   UROBILINOGEN 1.0 08/11/2008 1745   NITRITE NEGATIVE  08/08/2020 1610   LEUKOCYTESUR NEGATIVE 08/08/2020 1610   Sepsis Labs Invalid input(s): PROCALCITONIN,  WBC,  LACTICIDVEN Microbiology Recent Results (from the past 240 hour(s))  SARS CORONAVIRUS 2 (TAT 6-24 HRS) Nasopharyngeal Nasopharyngeal Swab     Status: None   Collection Time: 08/10/20  5:30 PM   Specimen: Nasopharyngeal Swab  Result Value Ref Range Status   SARS Coronavirus 2 NEGATIVE NEGATIVE Final    Comment: (NOTE) SARS-CoV-2 target nucleic acids are NOT DETECTED.  The SARS-CoV-2 RNA is generally detectable in upper and lower respiratory specimens during the acute phase of infection. Negative results do not preclude SARS-CoV-2 infection, do not rule out co-infections with other pathogens, and should not be used as the sole basis for treatment or other patient management decisions. Negative results must be combined with clinical observations, patient history, and epidemiological information. The expected result is Negative.  Fact Sheet for Patients: SugarRoll.be  Fact Sheet for Healthcare Providers: https://www.woods-mathews.com/  This test is not yet approved or cleared by the [place] FDA and  has been authorized for detection and/or diagnosis of SARS-CoV-2 by FDA under an Emergency Use Authorization (EUA). This EUA will remain  in effect (meaning this test can be used) for the duration of the COVID-19 declaration under Se ction 564(b)(1) of the Act, 21 U.S.C. section 360bbb-3(b)(1), unless the authorization is terminated or revoked sooner.  Performed at [facility], [place] [address], [place], [place] [address]      Time coordinating discharge: 35 minutes  SIGNED:   Cordelia Poche, MD [facility] 08/16/2020, 12:26 PM

## 2020-08-16 NOTE — Progress Notes (Signed)
OT Cancellation Note  Patient Details Name: [name] MRN: [id number] DOB: June 27, 1971   Cancelled Treatment:    Reason Eval/Treat Not Completed: OT screened, no needs identified, will sign off. Patient fully dressed upon arrival, reports already took a shower "I'm going home today." Politely declines any OT needs.   Delbert Phenix OT OT pager: [place] 08/16/2020, 8:30 AM

## 2020-08-18 ENCOUNTER — Telehealth: Payer: Self-pay | Admitting: Physician Assistant

## 2020-08-18 NOTE — Telephone Encounter (Signed)
Patient has questions about medication - she was released from hospital on Wednesday.  Is still not feeling well

## 2020-08-21 NOTE — Telephone Encounter (Signed)
Called patient but VM is full, unable to leave a message to find out question of medication or how patient is feeling/doing

## 2020-08-23 ENCOUNTER — Encounter (HOSPITAL_COMMUNITY): Payer: Self-pay | Admitting: Emergency Medicine

## 2020-08-23 ENCOUNTER — Telehealth: Payer: Self-pay

## 2020-08-23 ENCOUNTER — Other Ambulatory Visit: Payer: Self-pay

## 2020-08-23 ENCOUNTER — Telehealth: Payer: BC Managed Care – PPO | Admitting: Physician Assistant

## 2020-08-23 DIAGNOSIS — R110 Nausea: Secondary | ICD-10-CM | POA: Diagnosis not present

## 2020-08-23 DIAGNOSIS — I110 Hypertensive heart disease with heart failure: Secondary | ICD-10-CM | POA: Diagnosis present

## 2020-08-23 DIAGNOSIS — I10 Essential (primary) hypertension: Secondary | ICD-10-CM | POA: Diagnosis not present

## 2020-08-23 DIAGNOSIS — I5032 Chronic diastolic (congestive) heart failure: Secondary | ICD-10-CM | POA: Diagnosis not present

## 2020-08-23 DIAGNOSIS — I340 Nonrheumatic mitral (valve) insufficiency: Secondary | ICD-10-CM | POA: Diagnosis not present

## 2020-08-23 DIAGNOSIS — K439 Ventral hernia without obstruction or gangrene: Secondary | ICD-10-CM | POA: Diagnosis not present

## 2020-08-23 DIAGNOSIS — K5909 Other constipation: Secondary | ICD-10-CM | POA: Diagnosis present

## 2020-08-23 DIAGNOSIS — R140 Abdominal distension (gaseous): Secondary | ICD-10-CM | POA: Diagnosis not present

## 2020-08-23 DIAGNOSIS — Z8249 Family history of ischemic heart disease and other diseases of the circulatory system: Secondary | ICD-10-CM | POA: Diagnosis not present

## 2020-08-23 DIAGNOSIS — Z833 Family history of diabetes mellitus: Secondary | ICD-10-CM

## 2020-08-23 DIAGNOSIS — K209 Esophagitis, unspecified without bleeding: Secondary | ICD-10-CM | POA: Diagnosis not present

## 2020-08-23 DIAGNOSIS — E1065 Type 1 diabetes mellitus with hyperglycemia: Secondary | ICD-10-CM | POA: Diagnosis present

## 2020-08-23 DIAGNOSIS — K2950 Unspecified chronic gastritis without bleeding: Secondary | ICD-10-CM | POA: Diagnosis not present

## 2020-08-23 DIAGNOSIS — K3184 Gastroparesis: Secondary | ICD-10-CM | POA: Diagnosis present

## 2020-08-23 DIAGNOSIS — I361 Nonrheumatic tricuspid (valve) insufficiency: Secondary | ICD-10-CM | POA: Diagnosis not present

## 2020-08-23 DIAGNOSIS — R000 Tachycardia, unspecified: Secondary | ICD-10-CM | POA: Diagnosis not present

## 2020-08-23 DIAGNOSIS — Z794 Long term (current) use of insulin: Secondary | ICD-10-CM

## 2020-08-23 DIAGNOSIS — E1040 Type 1 diabetes mellitus with diabetic neuropathy, unspecified: Secondary | ICD-10-CM | POA: Diagnosis present

## 2020-08-23 DIAGNOSIS — D735 Infarction of spleen: Secondary | ICD-10-CM | POA: Diagnosis not present

## 2020-08-23 DIAGNOSIS — D573 Sickle-cell trait: Secondary | ICD-10-CM | POA: Diagnosis not present

## 2020-08-23 DIAGNOSIS — E785 Hyperlipidemia, unspecified: Secondary | ICD-10-CM | POA: Diagnosis not present

## 2020-08-23 DIAGNOSIS — R8271 Bacteriuria: Secondary | ICD-10-CM | POA: Diagnosis present

## 2020-08-23 DIAGNOSIS — J90 Pleural effusion, not elsewhere classified: Secondary | ICD-10-CM | POA: Diagnosis not present

## 2020-08-23 DIAGNOSIS — Z20822 Contact with and (suspected) exposure to covid-19: Secondary | ICD-10-CM | POA: Diagnosis not present

## 2020-08-23 DIAGNOSIS — E10649 Type 1 diabetes mellitus with hypoglycemia without coma: Secondary | ICD-10-CM | POA: Diagnosis not present

## 2020-08-23 DIAGNOSIS — J9811 Atelectasis: Secondary | ICD-10-CM | POA: Diagnosis not present

## 2020-08-23 DIAGNOSIS — I509 Heart failure, unspecified: Secondary | ICD-10-CM | POA: Diagnosis not present

## 2020-08-23 DIAGNOSIS — B9620 Unspecified Escherichia coli [E. coli] as the cause of diseases classified elsewhere: Secondary | ICD-10-CM | POA: Diagnosis not present

## 2020-08-23 DIAGNOSIS — E1043 Type 1 diabetes mellitus with diabetic autonomic (poly)neuropathy: Principal | ICD-10-CM | POA: Diagnosis present

## 2020-08-23 DIAGNOSIS — N390 Urinary tract infection, site not specified: Secondary | ICD-10-CM | POA: Diagnosis present

## 2020-08-23 DIAGNOSIS — Z8349 Family history of other endocrine, nutritional and metabolic diseases: Secondary | ICD-10-CM | POA: Diagnosis not present

## 2020-08-23 DIAGNOSIS — R112 Nausea with vomiting, unspecified: Secondary | ICD-10-CM | POA: Diagnosis not present

## 2020-08-23 DIAGNOSIS — K2210 Ulcer of esophagus without bleeding: Secondary | ICD-10-CM | POA: Diagnosis not present

## 2020-08-23 DIAGNOSIS — I5042 Chronic combined systolic (congestive) and diastolic (congestive) heart failure: Secondary | ICD-10-CM | POA: Diagnosis not present

## 2020-08-23 LAB — CBC
HCT: 28.1 % — ABNORMAL LOW (ref 36.0–46.0)
Hemoglobin: 9 g/dL — ABNORMAL LOW (ref 12.0–15.0)
MCH: 29.6 pg (ref 26.0–34.0)
MCHC: 32 g/dL (ref 30.0–36.0)
MCV: 92.4 fL (ref 80.0–100.0)
Platelets: 365 10*3/uL (ref 150–400)
RBC: 3.04 MIL/uL — ABNORMAL LOW (ref 3.87–5.11)
RDW: 12.7 % (ref 11.5–15.5)
WBC: 5.4 10*3/uL (ref 4.0–10.5)
nRBC: 0 % (ref 0.0–0.2)

## 2020-08-23 LAB — COMPREHENSIVE METABOLIC PANEL
ALT: 11 U/L (ref 0–44)
AST: 9 U/L — ABNORMAL LOW (ref 15–41)
Albumin: 2.2 g/dL — ABNORMAL LOW (ref 3.5–5.0)
Alkaline Phosphatase: 76 U/L (ref 38–126)
Anion gap: 9 (ref 5–15)
BUN: 11 mg/dL (ref 6–20)
CO2: 27 mmol/L (ref 22–32)
Calcium: 8.7 mg/dL — ABNORMAL LOW (ref 8.9–10.3)
Chloride: 106 mmol/L (ref 98–111)
Creatinine, Ser: 1 mg/dL (ref 0.44–1.00)
GFR, Estimated: >60 mL/min (ref >60)
Glucose, Bld: 229 mg/dL — ABNORMAL HIGH (ref 70–99)
Potassium: 3.8 mmol/L (ref 3.5–5.1)
Sodium: 142 mmol/L (ref 135–145)
Total Bilirubin: 0.7 mg/dL (ref 0.3–1.2)
Total Protein: 5.8 g/dL — ABNORMAL LOW (ref 6.5–8.1)

## 2020-08-23 LAB — LIPASE, BLOOD: Lipase: 17 U/L (ref 11–51)

## 2020-08-23 NOTE — ED Triage Notes (Signed)
Patient states she has been vomiting since 08/04/21 and unable to keep food down. Denies diarrhea. Was admitted and discharged on 1/14, states no relief in symptoms. States no relief w/ Zofran.

## 2020-08-23 NOTE — Telephone Encounter (Signed)
Tablet is not formulated to crush. Can use solution instead if insurance will cover. Otherwise I know you can make a slurry with dissolving a tablet in 10 mL water for 15 minutes prior to use but I would be concerned the taste would make her vomit. Still recommend ER dispositon givng her medical history and recent hospitalization. At this point if she is refusing ER evaluation she at least needs to be seen at Urgent Care setting so exam and labs can be performed. I do not have lab available to me today.

## 2020-08-23 NOTE — Telephone Encounter (Signed)
Nurse Assessment Nurse: Raphael Gibney, RN, [place] Date/Time Eilene Ghazi Time): 08/23/2020 12:16:03 PM Confirm and document reason for call. If symptomatic, describe symptoms. ---Caller states she was in the hospital on 1/2 Has been vomiting since 08/04/20. she is gagging. Has abd pain on the left side in the middle. has urinated. she is dry heaving. vomited numerous times yesterday. Blood sugar 146. Does the patient have any new or worsening symptoms? ---Yes Will a triage be completed? ---Yes Related visit to physician within the last 2 weeks? ---No Does the PT have any chronic conditions? (i.e. diabetes, asthma, this includes High risk factors for pregnancy, etc.) ---Yes List chronic conditions. ---acid reflux; diabetes; CHF; congested lung failure Is the patient pregnant or possibly pregnant? (Ask all females between the ages of 42-55) ---No Is this a behavioral health or substance abuse call? ---No Guidelines Guideline Title Affirmed Question Affirmed Notes Nurse Date/Time (Eastern Time) Vomiting High-risk adult (e.g., diabetes mellitus, brain tumor, V-P shunt, hernia) Raphael Gibney, RN, Vera 08/23/2020 12:21:01 PM Disp. Time Eilene Ghazi Time) Disposition Final User PLEASE NOTE: All timestamps contained within this report are represented as [place] Standard Time. CONFIDENTIALTY NOTICE: This fax transmission is intended only for the addressee. It contains information that is legally privileged, confidential or otherwise protected from use or disclosure. If you are not the intended recipient, you are strictly prohibited from reviewing, disclosing, copying using or disseminating any of this information or taking any action in reliance on or regarding this information. If you have received this fax in error, please notify us immediately by telephone so that we can arrange for its return to [place]. Phone: [phone], Toll-Free: [phone], Fax: [phone] Page: 2 of 2 Call Id: [id number] 08/23/2020 12:32:26  PM Go to ED Now Yes Raphael Gibney, RN, Vanita Ingles Disposition Overriden: Go to ED Now (or PCP triage) Override Reason: Patient's symptoms need a higher level of care Caller Disagree/Comply Comply Caller Understands Yes PreDisposition Did not know what to do Care Advice Given Per Guideline GO TO ED NOW: * You need to be seen in the Emergency Department. Comments User: Dannielle Burn, RN Date/Time Eilene Ghazi Time): 08/23/2020 12:27:20 PM Caller states she is not going to the ER right now. She may go when her son gets off work at 5 pm and if she goes she will go to [employer]: Dannielle Burn, RN Date/Time Eilene Ghazi Time): 08/23/2020 12:29:35 PM triage outcome upgraded to go to ER as pt is having abd pain and dry heaved the whole time I was on the phone User: Dannielle Burn, RN Date/Time Eilene Ghazi Time): 08/23/2020 12:32:18 PM called back line at office and spoke to [place]. Triage outcome go to ER now. pt is not sure if she will go to the ER. She may go when her son gets off at 5 pm. Referrals GO TO FACILITY REFUSE

## 2020-08-23 NOTE — Telephone Encounter (Signed)
Patient called back today stating she has been vomiting since she was discharged from the hospital. Patient was sent to [facility] for triage and PCP recommended her to go back to ER for further evaluation

## 2020-08-23 NOTE — Telephone Encounter (Signed)
Spoke with patient about going to ER. She doesn't want to go back to ER and sit and wait. She is unable to take the Sucralfate due to unable to swallow. Can this medication be crushed?  Her blood sugar running 140 mg/dl She was still vomiting when leaving the hospital.  She still vomits with Zofran. Can this be changed to another medication? Vomits with water, eating and not eating. Still dry heaving today. Taking Tessalon for the cough which has helped calm the cough.

## 2020-08-24 ENCOUNTER — Inpatient Hospital Stay (HOSPITAL_COMMUNITY)
Admission: EM | Admit: 2020-08-24 | Discharge: 2020-08-26 | DRG: 074 | Disposition: A | Discharge status: Home / Self Care | Payer: BC Managed Care – PPO | Attending: Student | Admitting: Student

## 2020-08-24 ENCOUNTER — Emergency Department (HOSPITAL_COMMUNITY): Payer: BC Managed Care – PPO

## 2020-08-24 ENCOUNTER — Encounter (HOSPITAL_COMMUNITY): Payer: Self-pay

## 2020-08-24 ENCOUNTER — Inpatient Hospital Stay (HOSPITAL_COMMUNITY): Payer: BC Managed Care – PPO

## 2020-08-24 ENCOUNTER — Other Ambulatory Visit (HOSPITAL_COMMUNITY): Payer: BC Managed Care – PPO

## 2020-08-24 DIAGNOSIS — N390 Urinary tract infection, site not specified: Secondary | ICD-10-CM | POA: Diagnosis present

## 2020-08-24 DIAGNOSIS — E1065 Type 1 diabetes mellitus with hyperglycemia: Secondary | ICD-10-CM | POA: Diagnosis present

## 2020-08-24 DIAGNOSIS — E785 Hyperlipidemia, unspecified: Secondary | ICD-10-CM | POA: Diagnosis present

## 2020-08-24 DIAGNOSIS — D735 Infarction of spleen: Secondary | ICD-10-CM | POA: Diagnosis present

## 2020-08-24 DIAGNOSIS — R112 Nausea with vomiting, unspecified: Secondary | ICD-10-CM | POA: Diagnosis not present

## 2020-08-24 DIAGNOSIS — I340 Nonrheumatic mitral (valve) insufficiency: Secondary | ICD-10-CM | POA: Diagnosis not present

## 2020-08-24 DIAGNOSIS — R8271 Bacteriuria: Secondary | ICD-10-CM | POA: Diagnosis present

## 2020-08-24 DIAGNOSIS — K3184 Gastroparesis: Secondary | ICD-10-CM | POA: Diagnosis present

## 2020-08-24 DIAGNOSIS — I361 Nonrheumatic tricuspid (valve) insufficiency: Secondary | ICD-10-CM | POA: Diagnosis not present

## 2020-08-24 DIAGNOSIS — Z20822 Contact with and (suspected) exposure to covid-19: Secondary | ICD-10-CM | POA: Diagnosis present

## 2020-08-24 DIAGNOSIS — Z794 Long term (current) use of insulin: Secondary | ICD-10-CM | POA: Diagnosis not present

## 2020-08-24 DIAGNOSIS — I5042 Chronic combined systolic (congestive) and diastolic (congestive) heart failure: Secondary | ICD-10-CM | POA: Diagnosis not present

## 2020-08-24 DIAGNOSIS — E10649 Type 1 diabetes mellitus with hypoglycemia without coma: Secondary | ICD-10-CM | POA: Diagnosis not present

## 2020-08-24 DIAGNOSIS — E1040 Type 1 diabetes mellitus with diabetic neuropathy, unspecified: Secondary | ICD-10-CM | POA: Diagnosis present

## 2020-08-24 DIAGNOSIS — Z8249 Family history of ischemic heart disease and other diseases of the circulatory system: Secondary | ICD-10-CM | POA: Diagnosis not present

## 2020-08-24 DIAGNOSIS — Z833 Family history of diabetes mellitus: Secondary | ICD-10-CM | POA: Diagnosis not present

## 2020-08-24 DIAGNOSIS — J90 Pleural effusion, not elsewhere classified: Secondary | ICD-10-CM | POA: Diagnosis not present

## 2020-08-24 DIAGNOSIS — I5032 Chronic diastolic (congestive) heart failure: Secondary | ICD-10-CM | POA: Diagnosis present

## 2020-08-24 DIAGNOSIS — K2950 Unspecified chronic gastritis without bleeding: Secondary | ICD-10-CM | POA: Diagnosis not present

## 2020-08-24 DIAGNOSIS — K209 Esophagitis, unspecified without bleeding: Secondary | ICD-10-CM | POA: Diagnosis not present

## 2020-08-24 DIAGNOSIS — E1043 Type 1 diabetes mellitus with diabetic autonomic (poly)neuropathy: Secondary | ICD-10-CM | POA: Diagnosis present

## 2020-08-24 DIAGNOSIS — I10 Essential (primary) hypertension: Secondary | ICD-10-CM | POA: Diagnosis not present

## 2020-08-24 DIAGNOSIS — K5909 Other constipation: Secondary | ICD-10-CM | POA: Diagnosis present

## 2020-08-24 DIAGNOSIS — K2210 Ulcer of esophagus without bleeding: Secondary | ICD-10-CM | POA: Diagnosis not present

## 2020-08-24 DIAGNOSIS — B9620 Unspecified Escherichia coli [E. coli] as the cause of diseases classified elsewhere: Secondary | ICD-10-CM | POA: Diagnosis present

## 2020-08-24 DIAGNOSIS — I110 Hypertensive heart disease with heart failure: Secondary | ICD-10-CM | POA: Diagnosis present

## 2020-08-24 DIAGNOSIS — D573 Sickle-cell trait: Secondary | ICD-10-CM | POA: Diagnosis present

## 2020-08-24 DIAGNOSIS — Z8349 Family history of other endocrine, nutritional and metabolic diseases: Secondary | ICD-10-CM | POA: Diagnosis not present

## 2020-08-24 LAB — CBC WITH DIFFERENTIAL/PLATELET
Abs Immature Granulocytes: 0.02 10*3/uL (ref 0.00–0.07)
Basophils Absolute: 0 10*3/uL (ref 0.0–0.1)
Basophils Relative: 1 %
Eosinophils Absolute: 0 10*3/uL (ref 0.0–0.5)
Eosinophils Relative: 1 %
HCT: 25.8 % — ABNORMAL LOW (ref 36.0–46.0)
Hemoglobin: 8.3 g/dL — ABNORMAL LOW (ref 12.0–15.0)
Immature Granulocytes: 0 %
Lymphocytes Relative: 30 %
Lymphs Abs: 1.4 10*3/uL (ref 0.7–4.0)
MCH: 29.7 pg (ref 26.0–34.0)
MCHC: 32.2 g/dL (ref 30.0–36.0)
MCV: 92.5 fL (ref 80.0–100.0)
Monocytes Absolute: 0.3 10*3/uL (ref 0.1–1.0)
Monocytes Relative: 6 %
Neutro Abs: 2.8 10*3/uL (ref 1.7–7.7)
Neutrophils Relative %: 62 %
Platelets: 348 10*3/uL (ref 150–400)
RBC: 2.79 MIL/uL — ABNORMAL LOW (ref 3.87–5.11)
RDW: 12.6 % (ref 11.5–15.5)
WBC: 4.5 10*3/uL (ref 4.0–10.5)
nRBC: 0 % (ref 0.0–0.2)

## 2020-08-24 LAB — URINALYSIS, ROUTINE W REFLEX MICROSCOPIC
Bilirubin Urine: NEGATIVE
Glucose, UA: NEGATIVE mg/dL
Ketones, ur: NEGATIVE mg/dL
Nitrite: NEGATIVE
Protein, ur: >=300 mg/dL — AB
Specific Gravity, Urine: 1.017 (ref 1.005–1.030)
pH: 6 (ref 5.0–8.0)

## 2020-08-24 LAB — COMPREHENSIVE METABOLIC PANEL
ALT: 8 U/L (ref 0–44)
AST: 8 U/L — ABNORMAL LOW (ref 15–41)
Albumin: 1.8 g/dL — ABNORMAL LOW (ref 3.5–5.0)
Alkaline Phosphatase: 61 U/L (ref 38–126)
Anion gap: 8 (ref 5–15)
BUN: 7 mg/dL (ref 6–20)
CO2: 24 mmol/L (ref 22–32)
Calcium: 7.3 mg/dL — ABNORMAL LOW (ref 8.9–10.3)
Chloride: 111 mmol/L (ref 98–111)
Creatinine, Ser: 0.74 mg/dL (ref 0.44–1.00)
GFR, Estimated: >60 mL/min (ref >60)
Glucose, Bld: 113 mg/dL — ABNORMAL HIGH (ref 70–99)
Potassium: 3.4 mmol/L — ABNORMAL LOW (ref 3.5–5.1)
Sodium: 143 mmol/L (ref 135–145)
Total Bilirubin: 0.3 mg/dL (ref 0.3–1.2)
Total Protein: 4.9 g/dL — ABNORMAL LOW (ref 6.5–8.1)

## 2020-08-24 LAB — GLUCOSE, CAPILLARY: Glucose-Capillary: 136 mg/dL — ABNORMAL HIGH (ref 70–99)

## 2020-08-24 LAB — CBG MONITORING, ED
Glucose-Capillary: 111 mg/dL — ABNORMAL HIGH (ref 70–99)
Glucose-Capillary: 115 mg/dL — ABNORMAL HIGH (ref 70–99)
Glucose-Capillary: 119 mg/dL — ABNORMAL HIGH (ref 70–99)
Glucose-Capillary: 55 mg/dL — ABNORMAL LOW (ref 70–99)
Glucose-Capillary: 103 mg/dL — ABNORMAL HIGH (ref 70–99)

## 2020-08-24 LAB — BRAIN NATRIURETIC PEPTIDE: B Natriuretic Peptide: 350.6 pg/mL — ABNORMAL HIGH (ref 0.0–100.0)

## 2020-08-24 LAB — SARS CORONAVIRUS 2 (TAT 6-24 HRS): SARS Coronavirus 2: NEGATIVE

## 2020-08-24 LAB — HEPARIN LEVEL (UNFRACTIONATED): Heparin Unfractionated: 0.1 IU/mL — ABNORMAL LOW (ref 0.30–0.70)

## 2020-08-24 LAB — PREGNANCY, URINE: Preg Test, Ur: NEGATIVE

## 2020-08-24 MED ORDER — FAMOTIDINE IN NACL 20-0.9 MG/50ML-% IV SOLN
20.0000 mg | Freq: Once | INTRAVENOUS | Status: AC
  Administered 2020-08-24: 20 mg via INTRAVENOUS
  Filled 2020-08-24: qty 50

## 2020-08-24 MED ORDER — SODIUM CHLORIDE 0.9 % IV SOLN
1.0000 g | Freq: Once | INTRAVENOUS | Status: AC
  Administered 2020-08-24: 1 g via INTRAVENOUS
  Filled 2020-08-24: qty 10

## 2020-08-24 MED ORDER — IOHEXOL 300 MG/ML  SOLN
100.0000 mL | Freq: Once | INTRAMUSCULAR | Status: AC | PRN
  Administered 2020-08-24: 100 mL via INTRAVENOUS

## 2020-08-24 MED ORDER — ALBUTEROL SULFATE HFA 108 (90 BASE) MCG/ACT IN AERS
2.0000 | INHALATION_SPRAY | Freq: Four times a day (QID) | RESPIRATORY_TRACT | Status: DC | PRN
  Filled 2020-08-24: qty 6.7

## 2020-08-24 MED ORDER — METOCLOPRAMIDE HCL 5 MG/ML IJ SOLN
10.0000 mg | Freq: Once | INTRAMUSCULAR | Status: AC
  Administered 2020-08-24: 10 mg via INTRAVENOUS
  Filled 2020-08-24: qty 2

## 2020-08-24 MED ORDER — HEPARIN BOLUS VIA INFUSION: 2300.0000 [IU] | Freq: Once | INTRAVENOUS | Status: DC

## 2020-08-24 MED ORDER — MENTHOL 3 MG MT LOZG
1.0000 | LOZENGE | OROMUCOSAL | Status: DC | PRN
  Administered 2020-08-24: 3 mg via ORAL
  Filled 2020-08-24 (×2): qty 9

## 2020-08-24 MED ORDER — TORSEMIDE 20 MG PO TABS
20.0000 mg | ORAL_TABLET | ORAL | Status: DC
  Administered 2020-08-25: 20 mg via ORAL
  Filled 2020-08-24: qty 1

## 2020-08-24 MED ORDER — INSULIN ASPART 100 UNIT/ML ~~LOC~~ SOLN
0.0000 [IU] | SUBCUTANEOUS | Status: DC
  Administered 2020-08-24: 1 [IU] via SUBCUTANEOUS
  Administered 2020-08-26: 2 [IU] via SUBCUTANEOUS
  Filled 2020-08-24: qty 0.09

## 2020-08-24 MED ORDER — HEPARIN (PORCINE) 25000 UT/250ML-% IV SOLN
1650.0000 [IU]/h | INTRAVENOUS | Status: DC
  Administered 2020-08-25 (×2): 1650 [IU]/h via INTRAVENOUS
  Filled 2020-08-24 (×2): qty 250

## 2020-08-24 MED ORDER — LORAZEPAM 0.5 MG PO TABS
0.5000 mg | ORAL_TABLET | Freq: Three times a day (TID) | ORAL | Status: DC | PRN
  Administered 2020-08-25 – 2020-08-26 (×2): 0.5 mg via ORAL
  Filled 2020-08-24 (×2): qty 1

## 2020-08-24 MED ORDER — SODIUM CHLORIDE 0.9 % IV BOLUS
1000.0000 mL | Freq: Once | INTRAVENOUS | Status: AC
  Administered 2020-08-24: 1000 mL via INTRAVENOUS

## 2020-08-24 MED ORDER — ATORVASTATIN CALCIUM 10 MG PO TABS
10.0000 mg | ORAL_TABLET | Freq: Every day | ORAL | Status: DC
  Administered 2020-08-24 – 2020-08-26 (×3): 10 mg via ORAL
  Filled 2020-08-24 (×3): qty 1

## 2020-08-24 MED ORDER — SUCRALFATE 1 GM/10ML PO SUSP
1.0000 g | Freq: Three times a day (TID) | ORAL | Status: DC
  Administered 2020-08-24 – 2020-08-26 (×8): 1 g via ORAL
  Filled 2020-08-24 (×8): qty 10

## 2020-08-24 MED ORDER — METOCLOPRAMIDE HCL 5 MG/ML IJ SOLN
10.0000 mg | Freq: Four times a day (QID) | INTRAMUSCULAR | Status: DC
  Administered 2020-08-24 – 2020-08-25 (×4): 10 mg via INTRAVENOUS
  Filled 2020-08-24 (×4): qty 2

## 2020-08-24 MED ORDER — INSULIN PUMP
Freq: Three times a day (TID) | SUBCUTANEOUS | Status: DC
  Filled 2020-08-24: qty 1

## 2020-08-24 MED ORDER — IOHEXOL 350 MG/ML SOLN
100.0000 mL | Freq: Once | INTRAVENOUS | Status: AC | PRN
  Administered 2020-08-24: 100 mL via INTRAVENOUS

## 2020-08-24 MED ORDER — HEPARIN (PORCINE) 25000 UT/250ML-% IV SOLN
1400.0000 [IU]/h | INTRAVENOUS | Status: DC
  Administered 2020-08-24: 1400 [IU]/h via INTRAVENOUS
  Filled 2020-08-24: qty 250

## 2020-08-24 MED ORDER — POTASSIUM CHLORIDE 10 MEQ/100ML IV SOLN
10.0000 meq | INTRAVENOUS | Status: AC
Start: 2020-08-24 — End: 2020-08-24
  Administered 2020-08-24 (×4): 10 meq via INTRAVENOUS
  Filled 2020-08-24 (×4): qty 100

## 2020-08-24 MED ORDER — CARVEDILOL 6.25 MG PO TABS
6.2500 mg | ORAL_TABLET | Freq: Two times a day (BID) | ORAL | Status: DC
  Administered 2020-08-24 – 2020-08-25 (×3): 6.25 mg via ORAL
  Filled 2020-08-24 (×3): qty 1

## 2020-08-24 MED ORDER — SODIUM CHLORIDE 0.9 % IV SOLN
1.0000 g | INTRAVENOUS | Status: DC
  Administered 2020-08-25: 1 g via INTRAVENOUS
  Filled 2020-08-24: qty 1

## 2020-08-24 MED ORDER — HYDROXYZINE HCL 25 MG PO TABS
25.0000 mg | ORAL_TABLET | Freq: Three times a day (TID) | ORAL | Status: DC | PRN
  Administered 2020-08-24 – 2020-08-25 (×3): 25 mg via ORAL
  Filled 2020-08-24 (×3): qty 1

## 2020-08-24 MED ORDER — INSULIN DETEMIR 100 UNIT/ML ~~LOC~~ SOLN
18.0000 [IU] | Freq: Every day | SUBCUTANEOUS | Status: DC
  Administered 2020-08-24 – 2020-08-25 (×2): 18 [IU] via SUBCUTANEOUS
  Filled 2020-08-24 (×3): qty 0.18

## 2020-08-24 MED ORDER — INSULIN ASPART 100 UNIT/ML ~~LOC~~ SOLN: 0.0000 [IU] | SUBCUTANEOUS | Status: DC

## 2020-08-24 MED ORDER — PANTOPRAZOLE SODIUM 40 MG PO TBEC
40.0000 mg | DELAYED_RELEASE_TABLET | Freq: Two times a day (BID) | ORAL | Status: DC
  Administered 2020-08-24 – 2020-08-26 (×4): 40 mg via ORAL
  Filled 2020-08-24 (×4): qty 1

## 2020-08-24 NOTE — Progress Notes (Signed)
ANTICOAGULATION CONSULT NOTE - Initial Consult  Pharmacy Consult for heparin Indication: Splenic infarct  Allergies  Allergen Reactions  . Amoxicillin Itching  . Dulaglutide     Other reaction(s): vomiting  . Miconazole Nitrate     Other reaction(s): rash  . Lantus [Insulin Glargine] Itching and Rash    Patient Measurements: Height: 6' (182.9 cm) Weight: 83 kg (182 lb 15.7 oz) IBW/kg (Calculated) : 73.1 Heparin Dosing Weight: n/a. Use TBW = 83 kg  Vital Signs: Temp: 98.2 F (36.8 C) (01/20 0455) Temp Source: Oral (01/20 0455) BP: 156/97 (01/20 0455) Pulse Rate: 117 (01/20 0455)  Labs: Recent Labs    08/23/20 1702  HGB 9.0*  HCT 28.1*  PLT 365  CREATININE 1.00    Estimated Creatinine Clearance: 77.7 mL/min (by C-G formula based on SCr of 1 mg/dL).   Medical History: Past Medical History:  Diagnosis Date  . Anemia   . Anxiety   . Arthritis   . Asthma   . CHF (congestive heart failure) ([place])   . Depression   . History of chicken pox   . Migraines   . Mood disorder (HCC)    anxiety  . Prurigo nodularis    with diabetic dermopathy  . Type 1 diabetes, uncontrolled, with neuropathy (HCC)    [place]    Medications: Not on any anticoagulants PTA  Assessment: Pt is a 85 yoF presenting with abdominal pain, N/V. CT Abdomen reveals splenic infarct, pharmacy consulted to dose heparin.   Today, 08/24/20  CBC:   Hgb (9) is low but consistent with previous admission  Plt WNL  SCr 1, CrCl ~75 mL/min   Goal of Therapy:  Heparin level 0.3 - 0.7   Plan:   No bolus given low Hgb, discussed with MD  Initiate heparin infusion at 1400 units/hr  Check 6 hour HL  HL, CBC daily while on heparin infusion  Monitor for signs of bleeding  Lenis Noon, PharmD 08/24/2020,10:25 AM

## 2020-08-24 NOTE — Progress Notes (Signed)
Inpatient Diabetes Program Recommendations  AACE/ADA: New Consensus Statement on Inpatient Glycemic Control (2015)  Target Ranges:  Prepandial:   less than 140 mg/dL      Peak postprandial:   less than 180 mg/dL (1-2 hours)      Critically ill patients:  140 - 180 mg/dL   Lab Results  Component Value Date   GLUCAP 119 (H) 08/24/2020   HGBA1C 7.5 (H) 08/09/2020    Review of Glycemic Control  Diabetes history: DM1 Outpatient Diabetes medications: OmniPod Current orders for Inpatient glycemic control: OmniPod insulin pump  Pump runs out at 1300 today. Pt will need to change to Levemir and Novolog. Pt agreeable. Will put pump back on when home.   Inpatient Diabetes Program Recommendations:     Levemir 18 units QHS Novolog 0-9 units Q4H Novolog 3 units TID with meals while on CL or FL diet. When diet advanced, and pt eating well, increase Novolog to 6 units TID.  Current insulin pump settings are as follows:  Basal insulin  0000-0000       1.0 units/hour Total daily basal insulin: 24 units/24 hours  Carb Coverage 1:9       1 unit for every 9 grams of carbohydrates  Insulin Sensitivity 1:35     1 unit drops blood glucose 35 mg/dl  Target Glucose Goals 0000-0000       110 mg/dl Secure chat sent to md.     Will follow daily.  Thank you. Lorenda Peck, RD, LDN, CDE Inpatient Diabetes Coordinator [phone]

## 2020-08-24 NOTE — ED Notes (Signed)
Patient being transported to CT at this time 

## 2020-08-24 NOTE — ED Notes (Signed)
Attempted to place IV x 2 without success. IVT consult placed.

## 2020-08-24 NOTE — ED Notes (Signed)
Pt states she feels slightly dizzy. CBG 55. Pt given crackers and juice as requested.

## 2020-08-24 NOTE — Consult Note (Signed)
[facility] Surgery Consult  Note  [name] 10-27-1970  [id number].    Requesting MD: Cherylann Ratel Chief Complaint: Persistent nausea and vomiting since 08/04/20 Reason for Consult: Splenic infarction  HPI: Patient is a 50 year old female who was hospitalized 08/08/2020-08/16/20, with a chief complaint of nausea and vomiting.  She was not able to get medicines till 08/18/20, she could not take the large pill, and has not been able to keep anything down since 08/18/20. She called her primary care office yesterday complaining of nausea and vomiting and was eventually sent to the emergency department again.  She states she has had recurrent N/V, since 08/04/20.  She also has left sided pain, that was there on her last admit, it comes and goes.  LUQ more painful that LLQ,    Despite being hospitalized she states she had no relief of her symptoms.  She says her symptoms got worse 6 days ago.  She underwent EGD while hospitalized which showed an erosive esophagitis.  She was discharged on Phenergan and Zofran but reports that she could not get her prescriptions till 1/14.  She cannot take the large Carafate pill, and presented yesterday with recurrent, intractable nausea and vomiting.  Work-up in the ED: Is afebrile diastolic blood pressures in the 91-106 range.  Labs yesterday shows CMP is normal except for glucose of 229, calcium of 8.7, albumin of 2.2, total protein 5.8, total bilirubin 0.7. WBC 5.4, hemoglobin 9.0, hematocrit 28, platelets 365,000.  Urinalysis shows good deal protein, possible UTI, COVID studies pending.  CT of the chest and pelvis with contrast shows bilateral pleural effusions increased from her last study, there is a new large hypoattenuating defect in the spleen measuring 8 cm consistent with interval splenic infarction.  Splenic artery and vein appear grossly patent but was difficult to be sure secondary to contrast timing.  CT also showed cardiomegaly with interlobular  septal thickening consistent with pulmonary edema.  There is also's fat-containing umbilical hernia.  We are asked to evaluate for her splenic infarction.  Is multiple medical problems including type 1 diabetes, erosive esophagitis, hypertension, hyperlipidemia, bilateral pleural effusions, ROS: Review of Systems  Constitutional: Positive for weight loss (she has not been able to keep anything down since 08/18/20  she does not have a scale.).  HENT: Negative.   Eyes: Negative.   Respiratory: Positive for cough.   Cardiovascular: Positive for chest pain (right chest), palpitations (tachycardia) and leg swelling.  Gastrointestinal: Positive for abdominal pain, constipation, heartburn, nausea and vomiting.       She has chronic constipation, take Metamucil and still describes small hard round stool balls with BM  Genitourinary:       + odor  Musculoskeletal: Negative.   Skin: Negative.   Neurological: Positive for headaches (not migraines, just regular headaches).  Endo/Heme/Allergies: Negative.   Psychiatric/Behavioral: Negative.     Family History  Problem Relation Age of Onset  . Diabetes Mother        type 2  . Hypertension Mother   . Thyroid disease Mother   . Bipolar disorder Mother   . Heart disease Mother   . Calcium disorder Mother   . Cancer Maternal Grandmother        Breast, stomach  . Cancer Paternal Grandmother        stomach, lung (smoker)  . Diabetes Paternal Grandmother   . Diabetes Paternal Grandfather   . Heart failure Sister   . Diabetes Sister   . Stroke Maternal Aunt   .  Cancer Maternal Uncle        prostate  . CAD Maternal Aunt        stents  . Cancer Maternal Aunt 64       ovarian  1. Normal LV systolic function with visual EF 50-55%. Left ventricle cavity is normal in size. Mild left ventricular hypertrophy. Normal global wall motion. Doppler evidence of grade III (restrictive) diastolic dysfunction, elevated LAP. 2. Left atrial cavity is severely  dilated. 3. Mild (Grade I) mitral regurgitation. 4. Mild tricuspid regurgitation. 5. Mild pulmonic regurgitation. 6. No prior study for comparison.  Past Medical History:  Diagnosis Date  . Anemia   . Anxiety   . Arthritis   . Asthma   . CHF (congestive heart failure) ([place])   . Depression   . History of chicken pox   . Migraines   . Mood disorder (HCC)    anxiety  . Prurigo nodularis    with diabetic dermopathy  . Type 1 diabetes, uncontrolled, with neuropathy ([place])    [place]    Past Surgical History:  Procedure Laterality Date  . BIOPSY  08/11/2020   Procedure: BIOPSY;  Surgeon: Ladene Artist, MD;  Location: [facility];  Service: Endoscopy;;  . ESOPHAGOGASTRODUODENOSCOPY (EGD) WITH PROPOFOL N/A 08/11/2020   Procedure: ESOPHAGOGASTRODUODENOSCOPY (EGD) WITH PROPOFOL;  Surgeon: Ladene Artist, MD;  Location: [facility];  Service: Endoscopy;  Laterality: N/A;  . treadmill stress test  01/2013   WNL, low risk study    Social History:  reports that she has never smoked. She has never used smokeless tobacco. She reports previous alcohol use. She reports that she does not use drugs.   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  Allergies:  Allergies  Allergen Reactions  . Amoxicillin Itching  . Dulaglutide     Other reaction(s): vomiting  . Miconazole Nitrate     Other reaction(s): rash  . Lantus [Insulin Glargine] Itching and Rash    Prior to Admission medications   Medication Sig Start Date End Date Taking? Authorizing Provider  albuterol (PROAIR HFA) 108 (90 Base) MCG/ACT inhaler Inhale 2 puffs into the lungs every 6 (six) hours as needed for wheezing or shortness of breath. 07/03/20  Yes [name], PA-C  atorvastatin (LIPITOR) 10 MG tablet Take 10 mg by mouth daily.   Yes [provider]  carvedilol (COREG) 6.25 MG tablet Take 1 tablet (6.25 mg total) by mouth 2 (two) times daily.  06/26/20  Yes [name], MD  feeding supplement, GLUCERNA SHAKE, (GLUCERNA SHAKE) LIQD Take 237 mLs by mouth 2 (two) times daily between meals. 08/16/20  Yes Mariel Aloe, MD  hydrOXYzine (ATARAX/VISTARIL) 25 MG tablet Take 1 tablet (25 mg total) by mouth 3 (three) times daily as needed. Patient taking differently: Take 25 mg by mouth 3 (three) times daily as needed for anxiety or itching. 04/17/20  Yes [name], PA-C  Insulin Disposable Pump (OMNIPOD 10 PACK) MISC by Does not apply route.   Yes [provider]  LORazepam (ATIVAN) 0.5 MG tablet TAKE 1 TABLET BY MOUTH EVERY 8 HOURS AS NEEDED FOR ANXIETY. Patient taking differently: Take 0.5 mg by mouth every 8 (eight) hours as needed for anxiety. 05/22/20  Yes [name], PA-C  NOVOLOG 100 UNIT/ML injection SMARTSIG:0-120 Unit(s) SUB-Q Daily 04/06/20  Yes [provider]  ondansetron (ZOFRAN-ODT) 4 MG disintegrating tablet Take  1 tablet (4 mg total) by mouth every 6 (six) hours. 08/16/20 09/15/20 Yes Mariel Aloe, MD  pantoprazole (PROTONIX) 40 MG tablet Take 1 tablet (40 mg total) by mouth 2 (two) times daily. 08/16/20 09/15/20 Yes Mariel Aloe, MD  promethazine (PHENERGAN) 12.5 MG tablet Take 1 tablet (12.5 mg total) by mouth every 6 (six) hours. 08/16/20  Yes Mariel Aloe, MD  torsemide (DEMADEX) 20 MG tablet TAKE 1 TABLET (20 MG TOTAL) BY MOUTH EVERY MONDAY, WEDNESDAY, AND FRIDAY. 06/19/20  Yes [name], MD  Continuous Blood Gluc Sensor (FREESTYLE LIBRE 2 SENSOR) MISC CHANGE EVERY 14 DAYS 03/20/20   [provider]  sucralfate (CARAFATE) 1 GM/10ML suspension Take 10 mLs (1 g total) by mouth 4 (four) times daily -  before meals and at bedtime. 08/16/20 09/15/20  Mariel Aloe, MD     Blood pressure (!) 156/97, pulse (!) 117, temperature 98.2 F (36.8 C), temperature source Oral, resp. rate 18, height 6' (1.829 m), weight 83 kg, last menstrual period 08/06/2020, SpO2 97 %. Physical Exam:   She remains tachycardic General: pleasant, WD, thin AA woman in no acute distress. HEENT: head is normocephalic, atraumatic.  Sclera are noninjected.  Pupils are equal.  Ears and nose without any masses or lesions.  Mouth is pink and moist Heart: regular, rate, and rhythm.  Normal s1,s2. Soft murmur, no, gallops, or rubs noted.  Palpable radial and pedal pulses bilaterally Lungs: CTAB, no wheezes, rhonchi, or rales noted.  Respiratory effort nonlabored Abd: soft, tender on the left, LUQ>>LLQ, she has had pain here for some time.   ND, +BS, no masses, hernias, or organomegaly MS: all 4 extremities are symmetrical with no cyanosis, she has neuropathy with skin changes both lower legs Skin: warm and dry with no masses, lesions, or rashes Neuro: Cranial nerves 2-12 grossly intact, sensation is normal throughout Psych: A&Ox3 with an appropriate affect.   Results for orders placed or performed during the hospital encounter of 08/24/20 (from the past 48 hour(s))  Lipase, blood     Status: None   Collection Time: 08/23/20  5:02 PM  Result Value Ref Range   Lipase 17 11 - 51 U/L    Comment: Performed at [facility], [place] [address] [place], [place] [address]  Comprehensive metabolic panel     Status: Abnormal   Collection Time: 08/23/20  5:02 PM  Result Value Ref Range   Sodium 142 135 - 145 mmol/L   Potassium 3.8 3.5 - 5.1 mmol/L   Chloride 106 98 - 111 mmol/L   CO2 27 22 - 32 mmol/L   Glucose, Bld 229 (H) 70 - 99 mg/dL    Comment: Glucose reference range applies only to samples taken after fasting for at least 8 hours.   BUN 11 6 - 20 mg/dL   Creatinine, Ser 1.00 0.44 - 1.00 mg/dL   Calcium 8.7 (L) 8.9 - 10.3 mg/dL   Total Protein 5.8 (L) 6.5 - 8.1 g/dL   Albumin 2.2 (L) 3.5 - 5.0 g/dL   AST 9 (L) 15 - 41 U/L   ALT 11 0 - 44 U/L   Alkaline Phosphatase 76 38 - 126 U/L   Total Bilirubin 0.7 0.3 - 1.2 mg/dL   GFR, Estimated >60 >60 mL/min    Comment: (NOTE) Calculated  using the CKD-EPI Creatinine Equation (2021)    Anion gap 9 5 - 15    Comment: Performed at [facility], [place]  [address] [place], [place] [address]  CBC     Status: Abnormal   Collection Time: 08/23/20  5:02 PM  Result Value Ref Range   WBC 5.4 4.0 - 10.5 K/uL   RBC 3.04 (L) 3.87 - 5.11 MIL/uL   Hemoglobin 9.0 (L) 12.0 - 15.0 g/dL   HCT 28.1 (L) 36.0 - 46.0 %   MCV 92.4 80.0 - 100.0 fL   MCH 29.6 26.0 - 34.0 pg   MCHC 32.0 30.0 - 36.0 g/dL   RDW 12.7 11.5 - 15.5 %   Platelets 365 150 - 400 K/uL   nRBC 0.0 0.0 - 0.2 %    Comment: Performed at [facility], [place] [address], [place], [place] [address]  Brain natriuretic peptide     Status: Abnormal   Collection Time: 08/23/20  5:02 PM  Result Value Ref Range   B Natriuretic Peptide 350.6 (H) 0.0 - 100.0 pg/mL    Comment: Performed at [facility], [place] [address] [place], [place] [address]  POC CBG, ED     Status: Abnormal   Collection Time: 08/24/20  3:35 AM  Result Value Ref Range   Glucose-Capillary 111 (H) 70 - 99 mg/dL    Comment: Glucose reference range applies only to samples taken after fasting for at least 8 hours.  Urinalysis, Routine w reflex microscopic Urine, Clean Catch     Status: Abnormal   Collection Time: 08/24/20  4:56 AM  Result Value Ref Range   Color, Urine YELLOW YELLOW   APPearance HAZY (A) CLEAR   Specific Gravity, Urine 1.017 1.005 - 1.030   pH 6.0 5.0 - 8.0   Glucose, UA NEGATIVE NEGATIVE mg/dL   Hgb urine dipstick SMALL (A) NEGATIVE   Bilirubin Urine NEGATIVE NEGATIVE   Ketones, ur NEGATIVE NEGATIVE mg/dL   Protein, ur >=300 (A) NEGATIVE mg/dL   Nitrite NEGATIVE NEGATIVE   Leukocytes,Ua TRACE (A) NEGATIVE   RBC / HPF 0-5 0 - 5 RBC/hpf   WBC, UA 11-20 0 - 5 WBC/hpf   Bacteria, UA MANY (A) NONE SEEN   Squamous Epithelial / LPF 0-5 0 - 5   Mucus PRESENT    Hyaline Casts, UA PRESENT     Comment: Performed at [facility],  [place] [address], [place], [place] [address]  CBG monitoring, ED     Status: Abnormal   Collection Time: 08/24/20  6:26 AM  Result Value Ref Range   Glucose-Capillary 115 (H) 70 - 99 mg/dL    Comment: Glucose reference range applies only to samples taken after fasting for at least 8 hours.   CT Abdomen Pelvis W Contrast  Result Date: 08/24/2020 CLINICAL DATA:  Left lower quadrant abdominal pain. EXAM: CT ABDOMEN AND PELVIS WITH CONTRAST TECHNIQUE: Multidetector CT imaging of the abdomen and pelvis was performed using the standard protocol following bolus administration of intravenous contrast. CONTRAST:  158mL OMNIPAQUE IOHEXOL 300 MG/ML  SOLN COMPARISON:  CT chest dated August 14, 2020. CT dated August 08, 2020 FINDINGS: Lower chest: There are moderate-sized bilateral pleural effusions, significantly increased in size since the prior study.The heart is enlarged. Again noted is interlobular septal thickening Hepatobiliary: The liver is normal. Normal gallbladder.There is no biliary ductal dilation. Pancreas: Normal contours without ductal dilatation. No peripancreatic fluid collection. Spleen: There is a new large hypoattenuating defect in the spleen measuring approximately 8 cm. This is consistent with interval development of a splenic infarct. The splenic artery and vein appear grossly patent where  visualized. However the distal splenic artery is difficult to evaluate secondary to contrast timing. Adrenals/Urinary Tract: --Adrenal glands: Unremarkable. --Right kidney/ureter: No hydronephrosis or radiopaque kidney stones. --Left kidney/ureter: No hydronephrosis or radiopaque kidney stones. --Urinary bladder: Unremarkable. Stomach/Bowel: --Stomach/Duodenum: No hiatal hernia or other gastric abnormality. Normal duodenal course and caliber. --Small bowel: Unremarkable. --Colon: Unremarkable. --Appendix: Normal. Vascular/Lymphatic: Normal course and caliber of the major abdominal vessels. --No retroperitoneal  lymphadenopathy. --No mesenteric lymphadenopathy. --No pelvic or inguinal lymphadenopathy. Reproductive: Unremarkable Other: There is a small volume of free fluid the patient's pelvis. No free air. There is a fat containing umbilical hernia. Musculoskeletal. No acute displaced fractures. IMPRESSION: 1. New large hypoattenuating defect in the spleen measuring approximately 8 cm. This is consistent with interval development of a splenic infarct. The splenic artery and vein appear grossly patent where visualized. However the distal splenic artery is difficult to evaluate secondary to contrast timing. 2. Moderate-sized bilateral pleural effusions, significantly increased in size since the prior study. 3. Cardiomegaly with interlobular septal thickening, consistent with pulmonary edema. 4. Small volume of free fluid the patient's pelvis. 5. Fat containing umbilical hernia. Electronically Signed   By: Constance Holster M.D.   On: 08/24/2020 04:46   . heparin        Assessment/Plan Type 1 diabetes - Insulin dependent Bilateral pleural effusions  UTI Hx Sickle cell trait Erosive esophagitis 08/11/20 Hypertension Hyperlipidemia Hx diabetic neuropathy Hx mood disorder Chronic constipation   Recurrent intractable nausea and vomiting New splenic infarction  FEN:  NPO ID:  None DVT:  None  Plan:  Dr. Brantley Stage has reviewed the CT.  Nothing to do about the spleen currently, except pain control and decide if she needs post splenectomy immunizations.   Not sure what the source of the infarction is.  Echo last year showed a large LA, Mild TR, MR,and PR.  Consider repeat Echo to look for a source. CTA is pending. Consider repeat GI evaluation, she has a lot of stool in her colon also.   Earnstine [facility] Surgery 08/24/2020, 10:09 AM Please see Amion for pager number during day hours 7:00am-4:30pm

## 2020-08-24 NOTE — H&P (Addendum)
History and Physical    [name] BZM:[id number] DOB: Oct 09, 1970 DOA: 08/24/2020  PCP: [name], PA-C  Patient coming from: Home  Chief Complaint: intractable N/V; abdominal pain  HPI: [name] is a 50 y.o. female with medical history significant of DM1, HFpEF. Presenting with intractable N/V and left upper quadrant ab pain. She reports that she's had N/V since 08/04/20. She came to the hospital on 08/07/20 and stayed through 08/17/20. She was sent home with anti-emetics, but wasn't able to pick them up for a couple of days. Her N/V returned. When she was able to acquire the anti-emetics, they did not help. She tried Nurse, adult as well. She says that during this time she had sharp LUQ ab pain that wrapped to her left side. She had no relief from OTC meds. She spoke with her PCP. It was recommended that she return to the ED.    ED Course: Patient was given fluids, pepcid and reglan. Symptoms did not improved. Found to have UTI. Started on rocephin. TRH called for admission.   Review of Systems:  Denies CP, palpitations, dyspnea, syncope, fever, diarrhea, hematemesis. Review of systems is otherwise negative for all not mentioned in HPI.   PMHx Past Medical History:  Diagnosis Date  . Anemia   . Anxiety   . Arthritis   . Asthma   . CHF (congestive heart failure) ([place])   . Depression   . History of chicken pox   . Migraines   . Mood disorder ([facility])    anxiety  . Prurigo nodularis    with diabetic dermopathy  . Type 1 diabetes, uncontrolled, with neuropathy ([place])    [place]    PSHx Past Surgical History:  Procedure Laterality Date  . BIOPSY  08/11/2020   Procedure: BIOPSY;  Surgeon: Ladene Artist, MD;  Location: [facility];  Service: Endoscopy;;  . ESOPHAGOGASTRODUODENOSCOPY (EGD) WITH PROPOFOL N/A 08/11/2020   Procedure: ESOPHAGOGASTRODUODENOSCOPY (EGD) WITH PROPOFOL;  Surgeon: Ladene Artist, MD;  Location: [facility];  Service: Endoscopy;   Laterality: N/A;  . treadmill stress test  01/2013   WNL, low risk study    SocHx  reports that she has never smoked. She has never used smokeless tobacco. She reports previous alcohol use. She reports that she does not use drugs.  Allergies  Allergen Reactions  . Amoxicillin Itching  . Dulaglutide     Other reaction(s): vomiting  . Miconazole Nitrate     Other reaction(s): rash  . Lantus [Insulin Glargine] Itching and Rash    FamHx Family History  Problem Relation Age of Onset  . Diabetes Mother        type 2  . Hypertension Mother   . Thyroid disease Mother   . Bipolar disorder Mother   . Heart disease Mother   . Calcium disorder Mother   . Cancer Maternal Grandmother        Breast, stomach  . Cancer Paternal Grandmother        stomach, lung (smoker)  . Diabetes Paternal Grandmother   . Diabetes Paternal Grandfather   . Heart failure Sister   . Diabetes Sister   . Stroke Maternal Aunt   . Cancer Maternal Uncle        prostate  . CAD Maternal Aunt        stents  . Cancer Maternal Aunt 64       ovarian    Prior to Admission medications   Medication Sig Start Date End Date Taking? Authorizing  Provider  albuterol (PROAIR HFA) 108 (90 Base) MCG/ACT inhaler Inhale 2 puffs into the lungs every 6 (six) hours as needed for wheezing or shortness of breath. 07/03/20  Yes [name], PA-C  atorvastatin (LIPITOR) 10 MG tablet Take 10 mg by mouth daily.   Yes [provider]  carvedilol (COREG) 6.25 MG tablet Take 1 tablet (6.25 mg total) by mouth 2 (two) times daily. 06/26/20  Yes [name], MD  feeding supplement, GLUCERNA SHAKE, (GLUCERNA SHAKE) LIQD Take 237 mLs by mouth 2 (two) times daily between meals. 08/16/20  Yes Mariel Aloe, MD  hydrOXYzine (ATARAX/VISTARIL) 25 MG tablet Take 1 tablet (25 mg total) by mouth 3 (three) times daily as needed. Patient taking differently: Take 25 mg by mouth 3 (three) times daily as needed for anxiety or itching.  04/17/20  Yes [name], PA-C  Insulin Disposable Pump (OMNIPOD 10 PACK) MISC by Does not apply route.   Yes [provider]  LORazepam (ATIVAN) 0.5 MG tablet TAKE 1 TABLET BY MOUTH EVERY 8 HOURS AS NEEDED FOR ANXIETY. Patient taking differently: Take 0.5 mg by mouth every 8 (eight) hours as needed for anxiety. 05/22/20  Yes [name], PA-C  NOVOLOG 100 UNIT/ML injection SMARTSIG:0-120 Unit(s) SUB-Q Daily 04/06/20  Yes [provider]  ondansetron (ZOFRAN-ODT) 4 MG disintegrating tablet Take 1 tablet (4 mg total) by mouth every 6 (six) hours. 08/16/20 09/15/20 Yes Mariel Aloe, MD  pantoprazole (PROTONIX) 40 MG tablet Take 1 tablet (40 mg total) by mouth 2 (two) times daily. 08/16/20 09/15/20 Yes Mariel Aloe, MD  promethazine (PHENERGAN) 12.5 MG tablet Take 1 tablet (12.5 mg total) by mouth every 6 (six) hours. 08/16/20  Yes Mariel Aloe, MD  torsemide (DEMADEX) 20 MG tablet TAKE 1 TABLET (20 MG TOTAL) BY MOUTH EVERY MONDAY, WEDNESDAY, AND FRIDAY. 06/19/20  Yes [name], MD  Continuous Blood Gluc Sensor (FREESTYLE LIBRE 2 SENSOR) MISC CHANGE EVERY 14 DAYS 03/20/20   [provider]  sucralfate (CARAFATE) 1 GM/10ML suspension Take 10 mLs (1 g total) by mouth 4 (four) times daily -  before meals and at bedtime. 08/16/20 09/15/20  Mariel Aloe, MD    Physical Exam: Vitals:   08/23/20 2229 08/24/20 0138 08/24/20 0330 08/24/20 0455  BP: (!) 164/106 (!) 153/97 (!) 156/91 (!) 156/97  Pulse: (!) 105 97 (!) 111 (!) 117  Resp: $Remo'20 16 16 18  'peOnw$ Temp:   98.5 F (36.9 C) 98.2 F (36.8 C)  TempSrc:   Oral Oral  SpO2: 100% 98% 98% 97%  Weight:      Height:        General: 50 y.o. female resting in bed in NAD Eyes: PERRL, normal sclera ENMT: Nares patent w/o discharge, orophaynx clear, dentition normal, ears w/o discharge/lesions/ulcers Neck: Supple, trachea midline Cardiovascular: tachy, +S1, S2, no m/g/r, equal pulses throughout Respiratory: CTABL, no  w/r/r, normal WOB GI: BS+, ND, TTP LUQ.LLQ, no masses noted, no organomegaly noted MSK: No e/c/c Skin: No rashes, bruises, ulcerations noted Neuro: A&O x 3, no focal deficits Psyc: Appropriate interaction and affect, calm/cooperative  Labs on Admission: I have personally reviewed following labs and imaging studies  CBC: Recent Labs  Lab 08/23/20 1702  WBC 5.4  HGB 9.0*  HCT 28.1*  MCV 92.4  PLT 654   Basic Metabolic Panel: Recent Labs  Lab 08/23/20 1702  NA 142  K 3.8  CL 106  CO2 27  GLUCOSE 229*  BUN 11  CREATININE 1.00  CALCIUM 8.7*   GFR: Estimated Creatinine Clearance: 77.7 mL/min (by C-G formula based on SCr of 1 mg/dL). Liver Function Tests: Recent Labs  Lab 08/23/20 1702  AST 9*  ALT 11  ALKPHOS 76  BILITOT 0.7  PROT 5.8*  ALBUMIN 2.2*   Recent Labs  Lab 08/23/20 1702  LIPASE 17   No results for input(s): AMMONIA in the last 168 hours. Coagulation Profile: No results for input(s): INR, PROTIME in the last 168 hours. Cardiac Enzymes: No results for input(s): CKTOTAL, CKMB, CKMBINDEX, TROPONINI in the last 168 hours. BNP (last 3 results) Recent Labs    12/10/19 1116 01/06/20 0954 01/31/20 1008  PROBNP 831* 773* 1,696*   HbA1C: No results for input(s): HGBA1C in the last 72 hours. CBG: Recent Labs  Lab 08/24/20 0335 08/24/20 0626  GLUCAP 111* 115*   Lipid Profile: No results for input(s): CHOL, HDL, LDLCALC, TRIG, CHOLHDL, LDLDIRECT in the last 72 hours. Thyroid Function Tests: No results for input(s): TSH, T4TOTAL, FREET4, T3FREE, THYROIDAB in the last 72 hours. Anemia Panel: No results for input(s): VITAMINB12, FOLATE, FERRITIN, TIBC, IRON, RETICCTPCT in the last 72 hours. Urine analysis:    Component Value Date/Time   COLORURINE YELLOW 08/24/2020 0456   APPEARANCEUR HAZY (A) 08/24/2020 0456   LABSPEC 1.017 08/24/2020 0456   PHURINE 6.0 08/24/2020 0456   GLUCOSEU NEGATIVE 08/24/2020 0456   HGBUR SMALL (A) 08/24/2020 0456    BILIRUBINUR NEGATIVE 08/24/2020 0456   BILIRUBINUR negative 04/23/2019 1658   KETONESUR NEGATIVE 08/24/2020 0456   PROTEINUR >=300 (A) 08/24/2020 0456   UROBILINOGEN 0.2 04/23/2019 1658   UROBILINOGEN 1.0 08/11/2008 1745   NITRITE NEGATIVE 08/24/2020 0456   LEUKOCYTESUR TRACE (A) 08/24/2020 0456    Radiological Exams on Admission: CT Abdomen Pelvis W Contrast  Result Date: 08/24/2020 CLINICAL DATA:  Left lower quadrant abdominal pain. EXAM: CT ABDOMEN AND PELVIS WITH CONTRAST TECHNIQUE: Multidetector CT imaging of the abdomen and pelvis was performed using the standard protocol following bolus administration of intravenous contrast. CONTRAST:  149mL OMNIPAQUE IOHEXOL 300 MG/ML  SOLN COMPARISON:  CT chest dated August 14, 2020. CT dated August 08, 2020 FINDINGS: Lower chest: There are moderate-sized bilateral pleural effusions, significantly increased in size since the prior study.The heart is enlarged. Again noted is interlobular septal thickening Hepatobiliary: The liver is normal. Normal gallbladder.There is no biliary ductal dilation. Pancreas: Normal contours without ductal dilatation. No peripancreatic fluid collection. Spleen: There is a new large hypoattenuating defect in the spleen measuring approximately 8 cm. This is consistent with interval development of a splenic infarct. The splenic artery and vein appear grossly patent where visualized. However the distal splenic artery is difficult to evaluate secondary to contrast timing. Adrenals/Urinary Tract: --Adrenal glands: Unremarkable. --Right kidney/ureter: No hydronephrosis or radiopaque kidney stones. --Left kidney/ureter: No hydronephrosis or radiopaque kidney stones. --Urinary bladder: Unremarkable. Stomach/Bowel: --Stomach/Duodenum: No hiatal hernia or other gastric abnormality. Normal duodenal course and caliber. --Small bowel: Unremarkable. --Colon: Unremarkable. --Appendix: Normal. Vascular/Lymphatic: Normal course and caliber of the  major abdominal vessels. --No retroperitoneal lymphadenopathy. --No mesenteric lymphadenopathy. --No pelvic or inguinal lymphadenopathy. Reproductive: Unremarkable Other: There is a small volume of free fluid the patient's pelvis. No free air. There is a fat containing umbilical hernia. Musculoskeletal. No acute displaced fractures. IMPRESSION: 1. New large hypoattenuating defect in the spleen measuring approximately 8 cm. This is consistent with interval development of a splenic infarct. The splenic artery and vein appear grossly patent where visualized. However the  distal splenic artery is difficult to evaluate secondary to contrast timing. 2. Moderate-sized bilateral pleural effusions, significantly increased in size since the prior study. 3. Cardiomegaly with interlobular septal thickening, consistent with pulmonary edema. 4. Small volume of free fluid the patient's pelvis. 5. Fat containing umbilical hernia. Electronically Signed   By: Constance Holster M.D.   On: 08/24/2020 04:46    EKG: Independently reviewed. Sinus tach, no st elevations  Assessment/Plan Intractable N/V     - admit to inpt     - reglan scheduled     - fluids gently as she has a Hx of dHF; will also start with CLD  UTI     - UCx sent     - continue rocephin  Splenic infarct     - CT ab/pelvis from 1/3 showed normal spleen; CT ab/pelvis from this admission show infarction of spleen; she has sickle trait per her report     - check echo, CTA ab/pelvis     - start heparin gtt w/ pharmacy consult     - have consulted general surgery, they will review the case  DM1     - levemir, SSI, glucose checks     - CLD for now     - DM coordinator; pt history of insulin pump  Chronic HFpEF     - continue home meds     - daily wts, I&O  Hx of erosive esophagitis     - continue BID protonix and continue carafate  HTN     - continue coreg  HLD     - continue statin  Chronic bilateral pleural effusions     - denies  problem with respiratory status, monitor  DVT prophylaxis: heparin gtt  Code Status: FULL  Family Communication: None at bedside  Consults called: General Surgery   Status is: Inpatient  Remains inpatient appropriate because:Inpatient level of care appropriate due to severity of illness   Dispo: The patient is from: Home              Anticipated d/c is to: Home              Anticipated d/c date is: 2 days              Patient currently is not medically stable to d/c.  Jonnie Finner DO [facility]  If 7PM-7AM, please contact night-coverage [email or web address]  08/24/2020, 8:10 AM

## 2020-08-24 NOTE — ED Provider Notes (Signed)
[facility] DEPT Provider Note   CSN: [id number] Arrival date & time: 08/23/20  1517     History Chief Complaint  Patient presents with  . Vomiting    [name] is a 50 y.o. female with PMHx CHF with EF 50-55%, anxiety, depression, type 1 diabetes who presents to the ED today with complaint of persistent nausea and NBNB emesis that began 6 days ago. Pt reports she began having these symptoms on 12/31 and was seen in the ED a couple of days later. She was admitted from 1/04 - 1/12 with intractable nausea and vomiting secondary to her insulin pump becoming dislodged for 2 days; no signs of DKA. While in the hospital she had an EGD done without etiology found but did find erosive esophagitis. She was started on Protonix BID and Carafate suspension by GI with plans for outpatient follow up. It appears she was also discharged with zofran and phenergan. Pt reports that she went to the pharmacy the day of her discharge but the medication was not there so she went several days without it. She reports this caused her symptoms to start acting up again and they have been uncontrollable since then. Pt also complains of periumbilical abdominal pain. She denies any heavy EtOH use. No cannibis use. Denies fevers, chills, diarrhea, urinary symptoms, or any other associated symptoms.                                      The history is provided by the patient and medical records.       Past Medical History:  Diagnosis Date  . Anemia   . Anxiety   . Arthritis   . Asthma   . CHF (congestive heart failure) ([place])   . Depression   . History of chicken pox   . Migraines   . Mood disorder (HCC)    anxiety  . Prurigo nodularis    with diabetic dermopathy  . Type 1 diabetes, uncontrolled, with neuropathy [facility])    [place]    Patient Active Problem List   Diagnosis Date Noted  . Gastric polyps   . Generalized abdominal pain   . Intractable nausea and  vomiting 08/08/2020  . Bone pain 06/16/2020  . Deficiency anemia 04/21/2020  . Preventive measure 04/21/2020  . Heart failure with normal ejection fraction ([place]) 12/14/2019  . Abdominal pain, epigastric 12/02/2019  . Constipation 12/02/2019  . Loss of weight 12/02/2019  . Leg edema 11/21/2019  . Uncontrolled type 1 diabetes mellitus with hyperglycemia, with long-term current use of insulin ([place]) 10/11/2019  . Dysfunctional uterine bleeding 10/11/2019  . Mild renal insufficiency 10/11/2019  . 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[place]) 07/30/2016  . Multiple thyroid nodules 07/30/2016  . Complicated grieving 02/63/7858  . Ulcers of both lower legs ([place]) 02/20/2014  . Health maintenance examination 09/20/2013  . Diabetic ulcer of left foot ([place]) 05/12/2013  . Diabetic foot ulcers ([place]) 05/01/2013  . Skin rash 01/05/2013  . Contraception management 12/31/2012  . Mood disorder ([place]) 12/30/2012  . Unspecified vitamin D deficiency 12/03/2012  . H/O thyroid nodule 12/03/2012  . Type 1 diabetes mellitus with diabetic neuropathy ([place]) 06/18/2007    Past Surgical History:  Procedure Laterality Date  . BIOPSY  08/11/2020   Procedure: BIOPSY;  Surgeon: Ladene Artist, MD;  Location: [facility] ENDOSCOPY;  Service: Endoscopy;;  . ESOPHAGOGASTRODUODENOSCOPY (EGD) WITH PROPOFOL N/A 08/11/2020   Procedure: ESOPHAGOGASTRODUODENOSCOPY (EGD) WITH PROPOFOL;  Surgeon: Ladene Artist, MD;  Location: [facility] ENDOSCOPY;  Service: Endoscopy;  Laterality: N/A;  . treadmill stress test  01/2013   WNL, low risk study     OB History    Gravida  6   Para  2   Term      Preterm      AB  4   Living  2     SAB  4   IAB      Ectopic      Multiple      Live Births              Family History  Problem Relation Age of Onset  . Diabetes Mother        type 2  . Hypertension Mother   . Thyroid disease  Mother   . Bipolar disorder Mother   . Heart disease Mother   . Calcium disorder Mother   . Cancer Maternal Grandmother        Breast, stomach  . Cancer Paternal Grandmother        stomach, lung (smoker)  . Diabetes Paternal Grandmother   . Diabetes Paternal Grandfather   . Heart failure Sister   . Diabetes Sister   . Stroke Maternal Aunt   . Cancer Maternal Uncle        prostate  . CAD Maternal Aunt        stents  . Cancer Maternal Aunt 64       ovarian    Social History   Tobacco Use  . Smoking status: Never Smoker  . Smokeless tobacco: Never Used  Vaping Use  . Vaping Use: Never used  Substance Use Topics  . Alcohol use: Not Currently  . Drug use: No    Home Medications Prior to Admission medications   Medication Sig Start Date End Date Taking? Authorizing Provider  albuterol (PROAIR HFA) 108 (90 Base) MCG/ACT inhaler Inhale 2 puffs into the lungs every 6 (six) hours as needed for wheezing or shortness of breath. 07/03/20  Yes [name], PA-C  atorvastatin (LIPITOR) 10 MG tablet Take 10 mg by mouth daily.   Yes [provider]  carvedilol (COREG) 6.25 MG tablet Take 1 tablet (6.25 mg total) by mouth 2 (two) times daily. 06/26/20  Yes [name], MD  feeding supplement, GLUCERNA SHAKE, (GLUCERNA SHAKE) LIQD Take 237 mLs by mouth 2 (two) times daily between meals. 08/16/20  Yes Mariel Aloe, MD  hydrOXYzine (ATARAX/VISTARIL) 25 MG tablet Take 1 tablet (25 mg total) by mouth 3 (three) times daily as needed. Patient taking differently: Take 25 mg by mouth 3 (three) times daily as needed for anxiety or itching. 04/17/20  Yes [name], PA-C  LORazepam (ATIVAN) 0.5 MG tablet TAKE 1 TABLET BY MOUTH EVERY 8 HOURS AS NEEDED FOR ANXIETY. Patient taking differently: Take 0.5 mg by mouth every 8 (eight) hours as needed for anxiety. 05/22/20  Yes [name], PA-C  NOVOLOG 100 UNIT/ML injection SMARTSIG:0-120 Unit(s) SUB-Q Daily 04/06/20  Yes  [provider]  pantoprazole (PROTONIX) 40 MG tablet Take 1 tablet (40 mg total) by mouth 2 (two) times daily. 08/16/20 09/15/20 Yes Mariel Aloe, MD  sucralfate (CARAFATE) 1 GM/10ML suspension Take 10 mLs (1 g total) by mouth 4 (four) times daily -  before meals and at bedtime. 08/16/20  09/15/20 Yes Mariel Aloe, MD  torsemide (DEMADEX) 20 MG tablet TAKE 1 TABLET (20 MG TOTAL) BY MOUTH EVERY MONDAY, WEDNESDAY, AND FRIDAY. 06/19/20  Yes [name], MD  Betamethasone Valerate 0.12 % foam Apply thin layer topically twice daily to areas of hair loss. 04/17/20   [name], PA-C  Continuous Blood Gluc Sensor (FREESTYLE LIBRE 2 SENSOR) MISC CHANGE EVERY 14 DAYS 03/20/20   [provider]  Insulin Disposable Pump (OMNIPOD 10 PACK) MISC by Does not apply route.    [provider]  ondansetron (ZOFRAN-ODT) 4 MG disintegrating tablet Take 1 tablet (4 mg total) by mouth every 6 (six) hours. 08/16/20 09/15/20  Mariel Aloe, MD  promethazine (PHENERGAN) 12.5 MG tablet Take 1 tablet (12.5 mg total) by mouth every 6 (six) hours. 08/16/20   Mariel Aloe, MD    Allergies    Amoxicillin, Dulaglutide, Miconazole nitrate, and Lantus [insulin glargine]  Review of Systems   Review of Systems  Constitutional: Positive for appetite change. Negative for chills and fever.  Gastrointestinal: Positive for abdominal pain, nausea and vomiting. Negative for diarrhea.  All other systems reviewed and are negative.   Physical Exam Updated Vital Signs BP (!) 156/91 (BP Location: Right Arm) Comment: RN notified  Pulse (!) 111 Comment: RN notifed  Temp 98.5 F (36.9 C) (Oral)   Resp 16   Ht 6' (1.829 m)   Wt 83 kg   LMP 08/06/2020 Comment: negative urine pregnancy 1/3  SpO2 98%   BMI 24.82 kg/m   Physical Exam Vitals and nursing note reviewed.  Constitutional:      Appearance: She is not ill-appearing or diaphoretic.  HENT:     Head: Normocephalic and atraumatic.      Mouth/Throat:     Mouth: Mucous membranes are dry.  Eyes:     Conjunctiva/sclera: Conjunctivae normal.  Cardiovascular:     Rate and Rhythm: Normal rate and regular rhythm.     Pulses: Normal pulses.  Pulmonary:     Effort: Pulmonary effort is normal.     Breath sounds: Normal breath sounds. No wheezing, rhonchi or rales.  Abdominal:     Palpations: Abdomen is soft.     Tenderness: There is abdominal tenderness. There is no right CVA tenderness, left CVA tenderness, guarding or rebound.     Comments: Soft, + periumbilical abdominal TTP, +BS throughout, no r/g/r, neg murphy's, neg mcburney's, no CVA TTP  Musculoskeletal:     Cervical back: Neck supple.  Skin:    General: Skin is warm and dry.  Neurological:     Mental Status: She is alert.     ED Results / Procedures / Treatments   Labs (all labs ordered are listed, but only abnormal results are displayed) Labs Reviewed  COMPREHENSIVE METABOLIC PANEL - Abnormal; Notable for the following components:      Result Value   Glucose, Bld 229 (*)    Calcium 8.7 (*)    Total Protein 5.8 (*)    Albumin 2.2 (*)    AST 9 (*)    All other components within normal limits  CBC - Abnormal; Notable for the following components:   RBC 3.04 (*)    Hemoglobin 9.0 (*)    HCT 28.1 (*)    All other components within normal limits  URINALYSIS, ROUTINE W REFLEX MICROSCOPIC - Abnormal; Notable for the following components:   APPearance HAZY (*)    Hgb urine dipstick SMALL (*)    Protein,  ur >=300 (*)    Leukocytes,Ua TRACE (*)    Bacteria, UA MANY (*)    All other components within normal limits  CBG MONITORING, ED - Abnormal; Notable for the following components:   Glucose-Capillary 111 (*)    All other components within normal limits  SARS CORONAVIRUS 2 (TAT 6-24 HRS)  URINE CULTURE  LIPASE, BLOOD  BRAIN NATRIURETIC PEPTIDE    EKG EKG Interpretation  Date/Time:  Thursday August 24 2020 05:13:52 EST Ventricular Rate:  107 PR  Interval:    QRS Duration: 79 QT Interval:  364 QTC Calculation: 486 R Axis:   79 Text Interpretation: Sinus tachycardia Borderline T wave abnormalities Borderline prolonged QT interval Confirmed by Shanon Rosser [phone]) on 08/24/2020 5:16:24 AM   Radiology CT Abdomen Pelvis W Contrast  Result Date: 08/24/2020 CLINICAL DATA:  Left lower quadrant abdominal pain. EXAM: CT ABDOMEN AND PELVIS WITH CONTRAST TECHNIQUE: Multidetector CT imaging of the abdomen and pelvis was performed using the standard protocol following bolus administration of intravenous contrast. CONTRAST:  115mL OMNIPAQUE IOHEXOL 300 MG/ML  SOLN COMPARISON:  CT chest dated August 14, 2020. CT dated August 08, 2020 FINDINGS: Lower chest: There are moderate-sized bilateral pleural effusions, significantly increased in size since the prior study.The heart is enlarged. Again noted is interlobular septal thickening Hepatobiliary: The liver is normal. Normal gallbladder.There is no biliary ductal dilation. Pancreas: Normal contours without ductal dilatation. No peripancreatic fluid collection. Spleen: There is a new large hypoattenuating defect in the spleen measuring approximately 8 cm. This is consistent with interval development of a splenic infarct. The splenic artery and vein appear grossly patent where visualized. However the distal splenic artery is difficult to evaluate secondary to contrast timing. Adrenals/Urinary Tract: --Adrenal glands: Unremarkable. --Right kidney/ureter: No hydronephrosis or radiopaque kidney stones. --Left kidney/ureter: No hydronephrosis or radiopaque kidney stones. --Urinary bladder: Unremarkable. Stomach/Bowel: --Stomach/Duodenum: No hiatal hernia or other gastric abnormality. Normal duodenal course and caliber. --Small bowel: Unremarkable. --Colon: Unremarkable. --Appendix: Normal. Vascular/Lymphatic: Normal course and caliber of the major abdominal vessels. --No retroperitoneal lymphadenopathy. --No mesenteric  lymphadenopathy. --No pelvic or inguinal lymphadenopathy. Reproductive: Unremarkable Other: There is a small volume of free fluid the patient's pelvis. No free air. There is a fat containing umbilical hernia. Musculoskeletal. No acute displaced fractures. IMPRESSION: 1. New large hypoattenuating defect in the spleen measuring approximately 8 cm. This is consistent with interval development of a splenic infarct. The splenic artery and vein appear grossly patent where visualized. However the distal splenic artery is difficult to evaluate secondary to contrast timing. 2. Moderate-sized bilateral pleural effusions, significantly increased in size since the prior study. 3. Cardiomegaly with interlobular septal thickening, consistent with pulmonary edema. 4. Small volume of free fluid the patient's pelvis. 5. Fat containing umbilical hernia. Electronically Signed   By: Constance Holster M.D.   On: 08/24/2020 04:46    Procedures Procedures (including critical care time)  Medications Ordered in ED Medications  cefTRIAXone (ROCEPHIN) 1 g in sodium chloride 0.9 % 100 mL IVPB (has no administration in time range)  sodium chloride 0.9 % bolus 1,000 mL (0 mLs Intravenous Stopped 08/24/20 0452)  metoCLOPramide (REGLAN) injection 10 mg (10 mg Intravenous Given 08/24/20 0351)  famotidine (PEPCID) IVPB 20 mg premix (0 mg Intravenous Stopped 08/24/20 0423)  iohexol (OMNIPAQUE) 300 MG/ML solution 100 mL (100 mLs Intravenous Contrast Given 08/24/20 0422)    ED Course  I have reviewed the triage vital signs and the nursing notes.  Pertinent labs & imaging results that were  available during my care of the patient were reviewed by me and considered in my medical decision making (see chart for details).    MDM Rules/Calculators/A&P                          50 year old female who presents to the ED today with persistent nausea, nonbloody nonbilious emesis, periumbilical abdominal pain for the past several days.  Patient  had a recent hospital admission from 1/0 4-1/12 with intractable nausea and vomiting.  Found to have erosive esophagitis on EGD.  Negative CT abdomen and pelvis.  She was discharged home with Protonix, Carafate, Zofran, Phenergan.  She states she has no relief with these prompting her to come back to her the ED today.  On arrival to the ED patient is afebrile, nontachypneic.  She is tachycardic in the 110s, suspect this is likely from dehydration as patient has dry mucous membranes on exam today.  She did have lab work done while in the waiting room including a CBC, CMP, lipase.  CBC without leukocytosis and hemoglobin stable at 9.0.  CMP with a glucose of 229, bicarb within normal limits at 27 and no gap.  No other electrolyte abnormalities at this time, LFTs unremarkable.  Creatinine stable at 1.00.  Lipase within normal limits at 17.  Exam patient has periumbilical abdominal tenderness palpation without rebound or guarding.  She denies any previous surgical history to abdomen.  Given this is a return visit with persistent belly pain will obtain a CT scan at this time.  Will provide fluids, Reglan, Pepcid.  We'll plan to fluid challenge if CT scan negative and if patient able to tolerate fluids will discharge home.  CT scan: IMPRESSION:  1. New large hypoattenuating defect in the spleen measuring  approximately 8 cm. This is consistent with interval development of  a splenic infarct. The splenic artery and vein appear grossly patent  where visualized. However the distal splenic artery is difficult to  evaluate secondary to contrast timing.  2. Moderate-sized bilateral pleural effusions, significantly  increased in size since the prior study.  3. Cardiomegaly with interlobular septal thickening, consistent with  pulmonary edema.  4. Small volume of free fluid the patient's pelvis.  5. Fat containing umbilical hernia.   EKG obtained due to concern for A fib with new splenic infarct; no findings of A  fib. Pt is sinus tach which appears is chronic. BNP ordered due to pleural effusions. Pt without respiratory complaints.   U/A has returned with + leuks, 11-20 WBCs, and many bacteria. Will send for culture and start on rocephin. Pt to be admitted with persistent nausea and vomiting and new splenic infarct. Discussed case with [facility] Dr. Alcario Drought who agrees to evaluate for admission. COVID test ordered.   This note was prepared using Dragon voice recognition software and may include unintentional dictation errors due to the inherent limitations of voice recognition software.  Final Clinical Impression(s) / ED Diagnoses Final diagnoses:  Nausea and vomiting in adult  Splenic infarct    Rx / DC Orders ED Discharge Orders    None       Eustaquio Maize, PA-C 08/24/20 0530    Molpus, Jenny Reichmann, MD 08/24/20 [phone]

## 2020-08-24 NOTE — Telephone Encounter (Signed)
Patient currently admitted to the hospital.

## 2020-08-24 NOTE — Progress Notes (Signed)
[facility] for heparin Indication: Splenic infarct  Allergies  Allergen Reactions  . Amoxicillin Itching  . Dulaglutide     Other reaction(s): vomiting  . Miconazole Nitrate     Other reaction(s): rash  . Lantus [Insulin Glargine] Itching and Rash    Patient Measurements: Height: 6' (182.9 cm) Weight: 83 kg (182 lb 15.7 oz) IBW/kg (Calculated) : 73.1 Heparin Dosing Weight: actual body weight   Vital Signs: BP: 154/111 (01/20 1915) Pulse Rate: 105 (01/20 1915)  Labs: Recent Labs    08/23/20 1702 08/24/20 1141 08/24/20 1850  HGB 9.0* 8.3*  --   HCT 28.1* 25.8*  --   PLT 365 348  --   HEPARINUNFRC  --   --  0.10*  CREATININE 1.00 0.74  --     Estimated Creatinine Clearance: 97.1 mL/min (by C-G formula based on SCr of 0.74 mg/dL).   Medical History: Past Medical History:  Diagnosis Date  . Anemia   . Anxiety   . Arthritis   . Asthma   . CHF (congestive heart failure) ([place])   . Depression   . History of chicken pox   . Migraines   . Mood disorder (HCC)    anxiety  . Prurigo nodularis    with diabetic dermopathy  . Type 1 diabetes, uncontrolled, with neuropathy (HCC)    [place]    Medications: Not on any anticoagulants PTA  Assessment: Pt is a 52 yoF presenting with abdominal pain, N/V. CT Abdomen reveals splenic infarct. Pharmacy consulted to dose IV heparin.   Today, 08/24/20  CBC:   Hgb low/decreased to 8.3  Pltc WNL  SCr 0.74, CrCl ~97 ml/min  Initial heparin level = 0.1 units/mL, subtherapeutic   No bleeding or infusion issues noted per nursing  Goal of Therapy:  Heparin level 0.3-0.7 units/mL Monitor platelets by anticoagulation protocol: yes   Plan:   No bolus given low Hgb per earlier discussion with MD  Increase heparin infusion to 1650 units/hr  Heparin level 6 hours after rate change  CBC, heparin level daily while on heparin infusion  Monitor for signs of bleeding   Lindell Spar,  PharmD, BCPS Clinical Pharmacist  08/24/2020,7:21 PM

## 2020-08-24 NOTE — ED Notes (Signed)
This RN called to give report, receiving RN is unable to take report at this time. Will call back .

## 2020-08-24 NOTE — Progress Notes (Signed)
08/24/20 @ 2030, Received pt from ED on a stretcher accompanied by ED RN, pt aox4 and no complains of n/v, on heparin drip 16.5 and will continue  plan of care.

## 2020-08-25 ENCOUNTER — Inpatient Hospital Stay (HOSPITAL_COMMUNITY): Payer: BC Managed Care – PPO

## 2020-08-25 DIAGNOSIS — I10 Essential (primary) hypertension: Secondary | ICD-10-CM

## 2020-08-25 DIAGNOSIS — J90 Pleural effusion, not elsewhere classified: Secondary | ICD-10-CM

## 2020-08-25 DIAGNOSIS — K2950 Unspecified chronic gastritis without bleeding: Secondary | ICD-10-CM

## 2020-08-25 DIAGNOSIS — D735 Infarction of spleen: Secondary | ICD-10-CM

## 2020-08-25 DIAGNOSIS — I361 Nonrheumatic tricuspid (valve) insufficiency: Secondary | ICD-10-CM

## 2020-08-25 DIAGNOSIS — E10649 Type 1 diabetes mellitus with hypoglycemia without coma: Secondary | ICD-10-CM

## 2020-08-25 DIAGNOSIS — I340 Nonrheumatic mitral (valve) insufficiency: Secondary | ICD-10-CM

## 2020-08-25 DIAGNOSIS — I5032 Chronic diastolic (congestive) heart failure: Secondary | ICD-10-CM

## 2020-08-25 DIAGNOSIS — K2210 Ulcer of esophagus without bleeding: Secondary | ICD-10-CM

## 2020-08-25 LAB — GLUCOSE, CAPILLARY
Glucose-Capillary: 107 mg/dL — ABNORMAL HIGH (ref 70–99)
Glucose-Capillary: 114 mg/dL — ABNORMAL HIGH (ref 70–99)
Glucose-Capillary: 168 mg/dL — ABNORMAL HIGH (ref 70–99)
Glucose-Capillary: 53 mg/dL — ABNORMAL LOW (ref 70–99)
Glucose-Capillary: 80 mg/dL (ref 70–99)
Glucose-Capillary: 82 mg/dL (ref 70–99)
Glucose-Capillary: 88 mg/dL (ref 70–99)
Glucose-Capillary: 90 mg/dL (ref 70–99)

## 2020-08-25 LAB — COMPREHENSIVE METABOLIC PANEL
ALT: 9 U/L (ref 0–44)
AST: 15 U/L (ref 15–41)
Albumin: 1.9 g/dL — ABNORMAL LOW (ref 3.5–5.0)
Alkaline Phosphatase: 66 U/L (ref 38–126)
Anion gap: 7 (ref 5–15)
BUN: 7 mg/dL (ref 6–20)
CO2: 24 mmol/L (ref 22–32)
Calcium: 8 mg/dL — ABNORMAL LOW (ref 8.9–10.3)
Chloride: 107 mmol/L (ref 98–111)
Creatinine, Ser: 0.97 mg/dL (ref 0.44–1.00)
GFR, Estimated: >60 mL/min (ref >60)
Glucose, Bld: 122 mg/dL — ABNORMAL HIGH (ref 70–99)
Potassium: 3.8 mmol/L (ref 3.5–5.1)
Sodium: 138 mmol/L (ref 135–145)
Total Bilirubin: 0.5 mg/dL (ref 0.3–1.2)
Total Protein: 5.3 g/dL — ABNORMAL LOW (ref 6.5–8.1)

## 2020-08-25 LAB — CBC
HCT: 24.9 % — ABNORMAL LOW (ref 36.0–46.0)
Hemoglobin: 7.8 g/dL — ABNORMAL LOW (ref 12.0–15.0)
MCH: 29 pg (ref 26.0–34.0)
MCHC: 31.3 g/dL (ref 30.0–36.0)
MCV: 92.6 fL (ref 80.0–100.0)
Platelets: 332 10*3/uL (ref 150–400)
RBC: 2.69 MIL/uL — ABNORMAL LOW (ref 3.87–5.11)
RDW: 12.8 % (ref 11.5–15.5)
WBC: 4.5 10*3/uL (ref 4.0–10.5)
nRBC: 0 % (ref 0.0–0.2)

## 2020-08-25 LAB — ECHOCARDIOGRAM COMPLETE
Area-P 1/2: 4.89 cm2
Height: 72 in
S' Lateral: 4.1 cm
Weight: 2884.8 oz

## 2020-08-25 LAB — HEPARIN LEVEL (UNFRACTIONATED)
Heparin Unfractionated: 0.33 IU/mL (ref 0.30–0.70)
Heparin Unfractionated: 0.47 IU/mL (ref 0.30–0.70)

## 2020-08-25 MED ORDER — SODIUM CHLORIDE 0.9 % IV SOLN
INTRAVENOUS | Status: DC | PRN
  Administered 2020-08-25: 250 mL via INTRAVENOUS

## 2020-08-25 MED ORDER — METOCLOPRAMIDE HCL 5 MG/ML IJ SOLN
5.0000 mg | Freq: Four times a day (QID) | INTRAMUSCULAR | Status: DC
  Administered 2020-08-25 – 2020-08-26 (×4): 5 mg via INTRAVENOUS
  Filled 2020-08-25 (×4): qty 2

## 2020-08-25 NOTE — Progress Notes (Signed)
[facility] for heparin Indication: Splenic infarct  Allergies  Allergen Reactions  . Amoxicillin Itching  . Dulaglutide     Other reaction(s): vomiting  . Miconazole Nitrate     Other reaction(s): rash  . Lantus [Insulin Glargine] Itching and Rash    Patient Measurements: Height: 6' (182.9 cm) Weight: 83 kg (182 lb 15.7 oz) IBW/kg (Calculated) : 73.1 Heparin Dosing Weight: actual body weight   Vital Signs: Temp: 98 F (36.7 C) (01/20 2125) Temp Source: Oral (01/20 2125) BP: 162/98 (01/20 2125) Pulse Rate: 98 (01/20 2125)  Labs: Recent Labs    08/23/20 1702 08/24/20 1141 08/24/20 1850 08/25/20 0141  HGB 9.0* 8.3*  --   --   HCT 28.1* 25.8*  --   --   PLT 365 348  --   --   HEPARINUNFRC  --   --  0.10* 0.33  CREATININE 1.00 0.74  --  0.97    Estimated Creatinine Clearance: 80.1 mL/min (by C-G formula based on SCr of 0.97 mg/dL).   Medical History: Past Medical History:  Diagnosis Date  . Anemia   . Anxiety   . Arthritis   . Asthma   . CHF (congestive heart failure) ([place])   . Depression   . History of chicken pox   . Migraines   . Mood disorder (HCC)    anxiety  . Prurigo nodularis    with diabetic dermopathy  . Type 1 diabetes, uncontrolled, with neuropathy (HCC)    [place]    Medications: Not on any anticoagulants PTA  Assessment: Pt is a 62 yoF presenting with abdominal pain, N/V. CT Abdomen reveals splenic infarct. Pharmacy consulted to dose IV heparin.   Today, 08/25/20  Heparin level 0.33- therapeutic on 1650 units/hr  CBC: Hgb low/decreased to 8.3, Pltc WNL on 1/20; 1/21 results not drawn yet  SCr 0.97- increased from yesterday  No bleeding or infusion issues noted per nursing  Goal of Therapy:  Heparin level 0.3-0.7 units/mL Monitor platelets by anticoagulation protocol: yes   Plan:   No bolus due to low Hgb per earlier discussion with MD  Continue heparin infusion at 1650  units/hr  Recheck Heparin level in 6 hours to confirm at steady-state  CBC, heparin level daily while on heparin infusion  Monitor for signs of bleeding  F/U long-term AC plans  Netta Cedars, PharmD, BCPS Clinical Pharmacist  08/25/2020,2:55 AM

## 2020-08-25 NOTE — Progress Notes (Signed)
Inpatient Diabetes Program Recommendations  AACE/ADA: New Consensus Statement on Inpatient Glycemic Control (2015)  Target Ranges:  Prepandial:   less than 140 mg/dL      Peak postprandial:   less than 180 mg/dL (1-2 hours)      Critically ill patients:  140 - 180 mg/dL   Lab Results  Component Value Date   GLUCAP 80 08/25/2020   HGBA1C 7.5 (H) 08/09/2020    Review of Glycemic Control  Diabetes history: DM1 Outpatient Diabetes medications: OmniPod Current orders for Inpatient glycemic control: Levemir 18 units QHS, Novolog 0-9 units Q4H  Had hypo this am of 53 mg/dL.  Inpatient Diabetes Program Recommendations:     Decrease Levemir to 16 units QHS Add Novolog 3 units TID for CHO coverage of [place] diet. When po intake increases and eating regular diet, will need to increase Novolog to 5-6 units TID for meal coverage. (Type 1 and makes no insulin)  Will put OmniPod back on when pt is discharged. Wait 24H from last Levemir dose.   Follow.   Thank you. Lorenda Peck, RD, LDN, CDE Inpatient Diabetes Coordinator [phone]

## 2020-08-25 NOTE — Progress Notes (Signed)
  Echocardiogram 2D Echocardiogram has been performed.  Matilde Bash 08/25/2020, 11:28 AM

## 2020-08-25 NOTE — Progress Notes (Signed)
    CC: Persistent nausea and vomiting, abdominal pain  Subjective: She is having less pain today and the nausea is not as bad.  She would like to have something to eat.  Objective: Vital signs in last 24 hours: Temp:  [98 F (36.7 C)-98.5 F (36.9 C)] 98.5 F (36.9 C) (01/21 0600) Pulse Rate:  [89-105] 89 (01/21 0600) Resp:  [16-20] 20 (01/21 0600) BP: (143-165)/(85-140) 145/96 (01/21 0600) SpO2:  [93 %-100 %] 93 % (01/21 0600) Weight:  [81.8 kg] 81.8 kg (01/21 0500) Last BM Date: 08/25/20 250 p.o. 171 IV Urine x3 Stool X1 Afebrile vital signs are stable blood pressure slightly elevated CMP is stable CT angio shows splenic artery and vein are patent.  There is no splenic artery aneurysm, no significant stenosis of the splenic or celiac arteries. Intake/Output from previous day: 01/20 0701 - 01/21 0700 In: 421 [P.O.:250; I.V.:171] Out: -  Intake/Output this shift: No intake/output data recorded.  General appearance: alert, cooperative and no distress GI: Soft still tender in the left side.  Few bowel sounds.  No current complaints of nausea or vomiting.  Positive BM.  Lab Results:  Recent Labs    08/23/20 1702 08/24/20 1141  WBC 5.4 4.5  HGB 9.0* 8.3*  HCT 28.1* 25.8*  PLT 365 348    BMET Recent Labs    08/24/20 1141 08/25/20 0141  NA 143 138  K 3.4* 3.8  CL 111 107  CO2 24 24  GLUCOSE 113* 122*  BUN 7 7  CREATININE 0.74 0.97  CALCIUM 7.3* 8.0*   PT/INR No results for input(s): LABPROT, INR in the last 72 hours.  Recent Labs  Lab 08/23/20 1702 08/24/20 1141 08/25/20 0141  AST 9* 8* 15  ALT $Re'11 8 9  'jMQ$ ALKPHOS 76 61 66  BILITOT 0.7 0.3 0.5  PROT 5.8* 4.9* 5.3*  ALBUMIN 2.2* 1.8* 1.9*     Lipase     Component Value Date/Time   LIPASE 17 08/23/2020 1702     Medications: . atorvastatin  10 mg Oral Daily  . carvedilol  6.25 mg Oral BID  . insulin aspart  0-9 Units Subcutaneous Q4H  . insulin detemir  18 Units Subcutaneous QHS  .  metoCLOPramide (REGLAN) injection  10 mg Intravenous Q6H  . pantoprazole  40 mg Oral BID  . sucralfate  1 g Oral TID AC & HS  . torsemide  20 mg Oral Q M,W,F    Assessment/Plan Diabetes - Insulin dependent Bilateral pleural effusions  UTI Hx Sickle cell trait Erosive esophagitis 08/11/20 Hypertension Hyperlipidemia Hx diabetic neuropathy Hx mood disorder Chronic constipation   Recurrent intractable nausea and vomiting New splenic infarction  FEN:  NPO ID:  None DVT:  None  Plan: Currently no need for surgical intervention.  Work-up for embolic source is ongoing.  Please call if we can be of further assistance.      LOS: 1 day    Billie Trager 08/25/2020 Please see Amion

## 2020-08-25 NOTE — Plan of Care (Signed)
Pt alert, no complains of N/V thru the night, tolerating PO clear liquids.  Problem: Nutrition: Goal: Adequate nutrition will be maintained Outcome: Progressing   Problem: Coping: Goal: Level of anxiety will decrease Outcome: Progressing   Problem: Health Behavior/Discharge Planning: Goal: Ability to manage health-related needs will improve Outcome: Progressing

## 2020-08-25 NOTE — Progress Notes (Signed)
° °  Hypoglycemic Event  CBG: 53  Treatment: 8 oz juice/soda  Symptoms: Pale and Shaky  Follow-up CBG: [id number] CBG Result:80  Possible Reasons for Event: Inadequate meal intake  Comments/MD notified: Dr. Orbie Hurst, Francetta Found

## 2020-08-25 NOTE — Progress Notes (Signed)
[facility] for heparin Indication: Splenic infarct  Allergies  Allergen Reactions  . Amoxicillin Itching  . Dulaglutide     Other reaction(s): vomiting  . Miconazole Nitrate     Other reaction(s): rash  . Lantus [Insulin Glargine] Itching and Rash    Patient Measurements: Height: 6' (182.9 cm) Weight: 81.8 kg (180 lb 4.8 oz) IBW/kg (Calculated) : 73.1 Heparin Dosing Weight: actual body weight   Vital Signs: Temp: 98.5 F (36.9 C) (01/21 0600) Temp Source: Oral (01/21 0600) BP: 145/96 (01/21 0600) Pulse Rate: 89 (01/21 0600)  Labs: Recent Labs    08/23/20 1702 08/24/20 1141 08/24/20 1850 08/25/20 0141 08/25/20 0817  HGB 9.0* 8.3*  --   --  7.8*  HCT 28.1* 25.8*  --   --  24.9*  PLT 365 348  --   --  332  HEPARINUNFRC  --   --  0.10* 0.33 0.47  CREATININE 1.00 0.74  --  0.97  --     Estimated Creatinine Clearance: 80.1 mL/min (by C-G formula based on SCr of 0.97 mg/dL).   Medical History: Past Medical History:  Diagnosis Date  . Anemia   . Anxiety   . Arthritis   . Asthma   . CHF (congestive heart failure) ([place])   . Depression   . History of chicken pox   . Migraines   . Mood disorder (HCC)    anxiety  . Prurigo nodularis    with diabetic dermopathy  . Type 1 diabetes, uncontrolled, with neuropathy (HCC)    [place]    Medications: Not on any anticoagulants PTA  Assessment: Pt is a 67 yoF presenting with abdominal pain, N/V. CT Abdomen reveals splenic infarct. Pharmacy consulted to dose IV heparin.   Today, 08/25/20  Heparin level 0.47- therapeutic on 1650 units/hr  CBC: Hgb low/decreased to 7.8, Pltc WNL   SCr 0.97- increased from yesterday  No bleeding or infusion issues noted per nursing  Goal of Therapy:  Heparin level 0.3-0.7 units/mL Monitor platelets by anticoagulation protocol: yes   Plan:   No bolus due to low Hgb per earlier discussion with MD  Continue heparin infusion at 1650  units/hr  CBC, heparin level daily while on heparin infusion  Monitor for signs of bleeding  F/U long-term AC plans  Ulice Dash D  08/25/2020,9:17 AM

## 2020-08-25 NOTE — Progress Notes (Signed)
PROGRESS NOTE  [name] JDU:[id number] DOB: 11-06-1970   PCP: Waldon Merl, PA-C  Patient is from: Home  DOA: 08/24/2020 LOS: 1  Chief complaints: Nausea/vomiting/abdominal pain  Brief Narrative / Interim history: 50 year old female with PMH of DM-one, diastolic CHF, HTN, erosive gastritis, pleural effusions, and recent hospitalization with intractable nausea and vomiting from 1/3-1/13 returning with similar complaints and admitted for intractable nausea and vomiting.  Also found to have a splenic infarct.  There was also concern about UTI and started on ceftriaxone but she has no UTI symptoms.  Subjective: Seen and examined earlier this morning.  No major events overnight of this morning.  Reports improvement in nausea, vomiting and abdominal pain but she has been getting IV Reglan.  She denies cardiopulmonary symptoms or UTI symptoms.  She denies trauma or fall.  Objective: Vitals:   08/24/20 2100 08/24/20 2125 08/25/20 0500 08/25/20 0600  BP: (!) 148/97 (!) 162/98  (!) 145/96  Pulse: (!) 102 98  89  Resp: 20 18  20   Temp: 98.2 F (36.8 C) 98 F (36.7 C)  98.5 F (36.9 C)  TempSrc: Oral Oral  Oral  SpO2: 100% 100%  93%  Weight:   81.8 kg   Height:   6' (1.829 m)     Intake/Output Summary (Last 24 hours) at 08/25/2020 1200 Last data filed at 08/25/2020 0600 Gross per 24 hour  Intake 420.99 ml  Output --  Net 420.99 ml   Filed Weights   08/23/20 1548 08/25/20 0500  Weight: 83 kg 81.8 kg    Examination:  GENERAL: No apparent distress.  Nontoxic. HEENT: MMM.  Vision and hearing grossly intact.  NECK: Supple.  No apparent JVD.  RESP:  No IWOB.  Fair aeration bilaterally. CVS:  RRR. Heart sounds normal.  ABD/GI/GU: BS+. Abd soft, NTND.  No CVA tenderness MSK/EXT:  Moves extremities. No apparent deformity. No edema.  SKIN: no apparent skin lesion or wound NEURO: Awake, alert and oriented appropriately.  No apparent focal neuro deficit. PSYCH:  Calm. Normal affect.   Procedures:  None  Microbiology summarized: Influenza and COVID-19 PCR nonreactive. Urine culture with E. coli  Assessment & Plan: Intractable nausea/vomiting/abdominal pain-potential causes include gastroparesis from diabetes or erosive gastritis.  She had EGD earlier this month that showed erosive esophagitis and chronic gastritis.  H. pylori testing was negative.  CT abdomen and pelvis without significant finding other than splenic infarct.  Symptoms improved with IV Reglan and IV fluid hydration. -Reduce IV Reglan to 5 mg every 6 hours.  Will eventually changed to p.o. -Continue Protonix and Carafate -Advance diet as tolerated  E. coli bacteriuria versus UTI-UA with many bacteria and trace leukocytes but no nitrites.  Urine culture with E. coli but patient without UTI symptoms.  Received 2 doses of ceftriaxone. 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  Reports history of sickle cell trait.  No surgical indication per general surgery since 50% of the spleen is intact. CTA abdomen and pelvis with patent intra-abdominal vessels and no DVT.  -Check EBV IgM -Discontinue heparin drip -No need for vaccination.  Controlled IDDM-one with hypoglycemia-not in DKA. Recent Labs  Lab 08/25/20 0003 08/25/20 0419 08/25/20 0801 08/25/20 0830 08/25/20 1154  GLUCAP 90 107* 53* 80 88  -Discontinue Levemir for now -Continue SSI-sensitive -Check hemoglobin A1c  Chronic diastolic CHF: Echo in 12/2019 with LVEF of 50 to 55%, G3 DD (restrictive) severe LAE.  CT abdomen and pelvis  with persistent bilateral moderate pleural effusion.  She has no cardiopulmonary symptoms.  Appears euvolemic on exam. -Continue home torsemide -Monitor fluid status -Check two-view chest x-ray to assess pleural effusion  Moderate bilateral pleural effusion: Noted on CT abdomen and pelvis.  Seems to be persistent based on the imaging  earlier this month.  Could be due to CHF.  -Two-view chest x-ray -We will consult IR for thoracocentesis and fluid analysis  Hx of erosive esophagitis and chronic gastritis-noted on recent EGD earlier this month.  H. pylori testing negative. -Continue Protonix twice daily and Carafate  Essential HTN: Normotensive. -Continue home Coreg and torsemide  HLD -Continue home statin.  Body mass index is 24.45 kg/m.         DVT prophylaxis:  Currently on heparin drip.  Will change to subcu Lovenox  Code Status: Full code Family Communication: Patient and/or RN. Available if any question.  Status is: Inpatient  Remains inpatient appropriate because:Ongoing diagnostic testing needed not appropriate for outpatient work up, IV treatments appropriate due to intensity of illness or inability to take PO and Inpatient level of care appropriate due to severity of illness   Dispo: The patient is from: Home              Anticipated d/c is to: Home              Anticipated d/c date is: 1 day              Patient currently is not medically stable to d/c.       Consultants:  General surgery    Sch Meds:  Scheduled Meds:  atorvastatin  10 mg Oral Daily   carvedilol  6.25 mg Oral BID   insulin aspart  0-9 Units Subcutaneous Q4H   insulin detemir  18 Units Subcutaneous QHS   metoCLOPramide (REGLAN) injection  10 mg Intravenous Q6H   pantoprazole  40 mg Oral BID   sucralfate  1 g Oral TID AC & HS   torsemide  20 mg Oral Q M,W,F   Continuous Infusions:  sodium chloride 250 mL (08/25/20 1043)   heparin 1,650 Units/hr (08/25/20 0325)   PRN Meds:.sodium chloride, albuterol, hydrOXYzine, LORazepam, menthol-cetylpyridinium  Antimicrobials: Anti-infectives (From admission, onward)   Start     Dose/Rate Route Frequency Ordered Stop   08/25/20 0900  cefTRIAXone (ROCEPHIN) 1 g in sodium chloride 0.9 % 100 mL IVPB  Status:  Discontinued        1 g 200 mL/hr over 30 Minutes  Intravenous Every 24 hours 08/24/20 1636 08/25/20 1109   08/24/20 0530  cefTRIAXone (ROCEPHIN) 1 g in sodium chloride 0.9 % 100 mL IVPB        1 g 200 mL/hr over 30 Minutes Intravenous  Once 08/24/20 0528 08/24/20 0616       I have personally reviewed the following labs and images: CBC: Recent Labs  Lab 08/23/20 1702 08/24/20 1141 08/25/20 0817  WBC 5.4 4.5 4.5  NEUTROABS  --  2.8  --   HGB 9.0* 8.3* 7.8*  HCT 28.1* 25.8* 24.9*  MCV 92.4 92.5 92.6  PLT 365 348 332   BMP &GFR Recent Labs  Lab 08/23/20 1702 08/24/20 1141 08/25/20 0141  NA 142 143 138  K 3.8 3.4* 3.8  CL 106 111 107  CO2 $Re'27 24 24  'iuq$ GLUCOSE 229* 113* 122*  BUN $Re'11 7 7  'zmB$ CREATININE 1.00 0.74 0.97  CALCIUM 8.7* 7.3* 8.0*   Estimated Creatinine Clearance: 80.1 mL/min (  by C-G formula based on SCr of 0.97 mg/dL). Liver & Pancreas: Recent Labs  Lab 08/23/20 1702 08/24/20 1141 08/25/20 0141  AST 9* 8* 15  ALT $Re'11 8 9  'shA$ ALKPHOS 76 61 66  BILITOT 0.7 0.3 0.5  PROT 5.8* 4.9* 5.3*  ALBUMIN 2.2* 1.8* 1.9*   Recent Labs  Lab 08/23/20 1702  LIPASE 17   No results for input(s): AMMONIA in the last 168 hours. Diabetic: No results for input(s): HGBA1C in the last 72 hours. Recent Labs  Lab 08/25/20 0003 08/25/20 0419 08/25/20 0801 08/25/20 0830 08/25/20 1154  GLUCAP 90 107* 53* 80 88   Cardiac Enzymes: No results for input(s): CKTOTAL, CKMB, CKMBINDEX, TROPONINI in the last 168 hours. Recent Labs    12/10/19 1116 01/06/20 0954 01/31/20 1008  PROBNP 831* 773* 1,696*   Coagulation Profile: No results for input(s): INR, PROTIME in the last 168 hours. Thyroid Function Tests: No results for input(s): TSH, T4TOTAL, FREET4, T3FREE, THYROIDAB in the last 72 hours. Lipid Profile: No results for input(s): CHOL, HDL, LDLCALC, TRIG, CHOLHDL, LDLDIRECT in the last 72 hours. Anemia Panel: No results for input(s): VITAMINB12, FOLATE, FERRITIN, TIBC, IRON, RETICCTPCT in the last 72 hours. Urine analysis:     Component Value Date/Time   COLORURINE YELLOW 08/24/2020 0456   APPEARANCEUR HAZY (A) 08/24/2020 0456   LABSPEC 1.017 08/24/2020 0456   PHURINE 6.0 08/24/2020 0456   GLUCOSEU NEGATIVE 08/24/2020 0456   HGBUR SMALL (A) 08/24/2020 0456   BILIRUBINUR NEGATIVE 08/24/2020 0456   BILIRUBINUR negative 04/23/2019 1658   KETONESUR NEGATIVE 08/24/2020 0456   PROTEINUR >=300 (A) 08/24/2020 0456   UROBILINOGEN 0.2 04/23/2019 1658   UROBILINOGEN 1.0 08/11/2008 1745   NITRITE NEGATIVE 08/24/2020 0456   LEUKOCYTESUR TRACE (A) 08/24/2020 0456   Sepsis Labs: Invalid input(s): PROCALCITONIN, [place]  Microbiology: Recent Results (from the past 240 hour(s))  SARS CORONAVIRUS 2 (TAT 6-24 HRS) Nasopharyngeal Nasopharyngeal Swab     Status: None   Collection Time: 08/24/20  5:23 AM   Specimen: Nasopharyngeal Swab  Result Value Ref Range Status   SARS Coronavirus 2 NEGATIVE NEGATIVE Final    Comment: (NOTE) SARS-CoV-2 target nucleic acids are NOT DETECTED.  The SARS-CoV-2 RNA is generally detectable in upper and lower respiratory specimens during the acute phase of infection. Negative results do not preclude SARS-CoV-2 infection, do not rule out co-infections with other pathogens, and should not be used as the sole basis for treatment or other patient management decisions. Negative results must be combined with clinical observations, patient history, and epidemiological information. The expected result is Negative.  Fact Sheet for Patients: SugarRoll.be  Fact Sheet for Healthcare Providers: https://www.woods-mathews.com/  This test is not yet approved or cleared by the [place] FDA and  has been authorized for detection and/or diagnosis of SARS-CoV-2 by FDA under an Emergency Use Authorization (EUA). This EUA will remain  in effect (meaning this test can be used) for the duration of the COVID-19 declaration under Se ction 564(b)(1) of the  Act, 21 U.S.C. section 360bbb-3(b)(1), unless the authorization is terminated or revoked sooner.  Performed at [facility], [place] [address] [place], [place] [address]   Urine culture     Status: Abnormal (Preliminary result)   Collection Time: 08/24/20 11:41 AM   Specimen: Urine, Clean Catch  Result Value Ref Range Status   Specimen Description   Final    URINE, CLEAN CATCH Performed at [facility], [place] [address] [place], [place] [address]  Special Requests   Final    NONE Performed at [facility], [place] [address], [place], [place] [address]    Culture (A)  Final    >=100,000 COLONIES/mL ESCHERICHIA COLI SUSCEPTIBILITIES TO FOLLOW Performed at [facility], [place] [address] [place], [place] [address]    Report Status PENDING  Incomplete    Radiology Studies: CT Angio Abd/Pel w/ and/or w/o  Result Date: 08/24/2020 CLINICAL DATA:  Splenic infarct.  Sickle cell trait. EXAM: CTA ABDOMEN AND PELVIS WITHOUT AND WITH CONTRAST TECHNIQUE: Multidetector CT imaging of the abdomen and pelvis was performed using the standard protocol during bolus administration of intravenous contrast. Multiplanar reconstructed images and MIPs were obtained and reviewed to evaluate the vascular anatomy. CONTRAST:  160mL OMNIPAQUE IOHEXOL 350 MG/ML SOLN COMPARISON:  08/14/2020 FINDINGS: VASCULAR Aorta: Normal caliber aorta without aneurysm, dissection, vasculitis or significant stenosis. Celiac: Patent without evidence of aneurysm, dissection, vasculitis or significant stenosis. SMA: Patent without evidence of aneurysm, dissection, vasculitis or significant stenosis. Renals: Both renal arteries are patent without evidence of aneurysm, dissection, vasculitis, fibromuscular dysplasia or significant stenosis. IMA: Patent without evidence of aneurysm, dissection, vasculitis or significant stenosis. Inflow: Patent without evidence of aneurysm, dissection, vasculitis  or significant stenosis. Proximal Outflow: Bilateral common femoral and visualized portions of the superficial and profunda femoral arteries are patent without evidence of aneurysm, dissection, vasculitis or significant stenosis. Veins: Portal, splenic, and supervised surg veins are patent. Review of the MIP images confirms the above findings. NON-VASCULAR Lower chest: Small to moderate bilateral pleural effusions again seen. Hepatobiliary: No focal liver abnormality is seen. No gallstones, gallbladder wall thickening, or biliary dilatation. Pancreas: Unremarkable. No pancreatic ductal dilatation or surrounding inflammatory changes. Spleen: Nonenhancing regions of the spleen are similar to that seen on the prior examination. Findings are consistent with infarct. Splenic artery and veins are patent. No splenic artery aneurysm identified. Adrenals/Urinary Tract: No significant abnormality of the kidneys and ureters. Adrenal glands are unremarkable. There is diffuse bladder wall thickening which may be partly due to under distension, however similar findings can be seen with chronic cystitis and outlet obstruction. Stomach/Bowel: Stomach is within normal limits. Appendix appears normal. No evidence of bowel wall thickening, distention, or inflammatory changes. Lymphatic: No enlarged lymph nodes. Reproductive: Uterus and bilateral adnexa are unremarkable. Other: Small fat containing umbilical hernia. Mild diffuse body wall edema. Musculoskeletal: No acute or significant osseous findings. IMPRESSION: VASCULAR Splenic artery and vein are patent. No splenic artery aneurysm. No significant stenosis of splenic or celiac artery. NON-VASCULAR Redemonstration of splenic infarct. Electronically Signed   By: Miachel Roux M.D.   On: 08/24/2020 14:37      Kimyah Frein T. Sumner  If 7PM-7AM, please contact night-coverage [email or web address] 08/25/2020, 12:00 PM

## 2020-08-26 DIAGNOSIS — E1065 Type 1 diabetes mellitus with hyperglycemia: Secondary | ICD-10-CM

## 2020-08-26 DIAGNOSIS — K209 Esophagitis, unspecified without bleeding: Secondary | ICD-10-CM

## 2020-08-26 DIAGNOSIS — I5042 Chronic combined systolic (congestive) and diastolic (congestive) heart failure: Secondary | ICD-10-CM

## 2020-08-26 LAB — GLUCOSE, CAPILLARY
Glucose-Capillary: 105 mg/dL — ABNORMAL HIGH (ref 70–99)
Glucose-Capillary: 105 mg/dL — ABNORMAL HIGH (ref 70–99)
Glucose-Capillary: 169 mg/dL — ABNORMAL HIGH (ref 70–99)
Glucose-Capillary: 63 mg/dL — ABNORMAL LOW (ref 70–99)
Glucose-Capillary: 91 mg/dL (ref 70–99)

## 2020-08-26 LAB — CBC
HCT: 23.7 % — ABNORMAL LOW (ref 36.0–46.0)
Hemoglobin: 7.6 g/dL — ABNORMAL LOW (ref 12.0–15.0)
MCH: 29.6 pg (ref 26.0–34.0)
MCHC: 32.1 g/dL (ref 30.0–36.0)
MCV: 92.2 fL (ref 80.0–100.0)
Platelets: 348 10*3/uL (ref 150–400)
RBC: 2.57 MIL/uL — ABNORMAL LOW (ref 3.87–5.11)
RDW: 12.9 % (ref 11.5–15.5)
WBC: 4.9 10*3/uL (ref 4.0–10.5)
nRBC: 0 % (ref 0.0–0.2)

## 2020-08-26 LAB — RENAL FUNCTION PANEL
Albumin: 1.9 g/dL — ABNORMAL LOW (ref 3.5–5.0)
Anion gap: 8 (ref 5–15)
BUN: 7 mg/dL (ref 6–20)
CO2: 26 mmol/L (ref 22–32)
Calcium: 7.9 mg/dL — ABNORMAL LOW (ref 8.9–10.3)
Chloride: 106 mmol/L (ref 98–111)
Creatinine, Ser: 1.18 mg/dL — ABNORMAL HIGH (ref 0.44–1.00)
GFR, Estimated: 56 mL/min — ABNORMAL LOW (ref >60)
Glucose, Bld: 183 mg/dL — ABNORMAL HIGH (ref 70–99)
Phosphorus: 4 mg/dL (ref 2.5–4.6)
Potassium: 3.6 mmol/L (ref 3.5–5.1)
Sodium: 140 mmol/L (ref 135–145)

## 2020-08-26 LAB — VITAMIN B12: Vitamin B-12: 371 pg/mL (ref 180–914)

## 2020-08-26 LAB — URINE CULTURE: Culture: >=100000 — AB

## 2020-08-26 LAB — HEPARIN LEVEL (UNFRACTIONATED): Heparin Unfractionated: 0.41 IU/mL (ref 0.30–0.70)

## 2020-08-26 LAB — BRAIN NATRIURETIC PEPTIDE: B Natriuretic Peptide: 237.9 pg/mL — ABNORMAL HIGH (ref 0.0–100.0)

## 2020-08-26 LAB — HEMOGLOBIN A1C
Hgb A1c MFr Bld: 7.2 % — ABNORMAL HIGH (ref 4.8–5.6)
Mean Plasma Glucose: 159.94 mg/dL

## 2020-08-26 LAB — IRON AND TIBC
Iron: 33 ug/dL (ref 28–170)
Saturation Ratios: 18 % (ref 10.4–31.8)
TIBC: 186 ug/dL — ABNORMAL LOW (ref 250–450)
UIBC: 153 ug/dL

## 2020-08-26 LAB — FOLATE: Folate: 6.6 ng/mL (ref >5.9)

## 2020-08-26 LAB — RETICULOCYTES
Immature Retic Fract: 21 % — ABNORMAL HIGH (ref 2.3–15.9)
RBC.: 2.66 MIL/uL — ABNORMAL LOW (ref 3.87–5.11)
Retic Count, Absolute: 58.5 10*3/uL (ref 19.0–186.0)
Retic Ct Pct: 2.2 % (ref 0.4–3.1)

## 2020-08-26 LAB — FERRITIN: Ferritin: 175 ng/mL (ref 11–307)

## 2020-08-26 LAB — MAGNESIUM: Magnesium: 1.6 mg/dL — ABNORMAL LOW (ref 1.7–2.4)

## 2020-08-26 MED ORDER — MAGNESIUM SULFATE 2 GM/50ML IV SOLN
2.0000 g | Freq: Once | INTRAVENOUS | Status: AC
  Administered 2020-08-26: 2 g via INTRAVENOUS
  Filled 2020-08-26: qty 50

## 2020-08-26 MED ORDER — ENOXAPARIN SODIUM 40 MG/0.4ML ~~LOC~~ SOLN
40.0000 mg | SUBCUTANEOUS | Status: DC
  Administered 2020-08-26: 40 mg via SUBCUTANEOUS
  Filled 2020-08-26: qty 0.4

## 2020-08-26 MED ORDER — CARVEDILOL 12.5 MG PO TABS
12.5000 mg | ORAL_TABLET | Freq: Two times a day (BID) | ORAL | Status: DC
  Administered 2020-08-26: 12.5 mg via ORAL
  Filled 2020-08-26: qty 1

## 2020-08-26 MED ORDER — SUCRALFATE 1 GM/10ML PO SUSP: 1.0000 g | Freq: Three times a day (TID) | ORAL | 0 refills | Status: DC

## 2020-08-26 MED ORDER — CARVEDILOL 12.5 MG PO TABS: 6.2500 mg | ORAL_TABLET | Freq: Two times a day (BID) | ORAL | 1 refills | Status: DC

## 2020-08-26 MED ORDER — METOCLOPRAMIDE HCL 5 MG PO TABS: 5.0000 mg | ORAL_TABLET | Freq: Three times a day (TID) | ORAL | 0 refills | Status: DC | PRN

## 2020-08-26 NOTE — Progress Notes (Signed)
[facility] for heparin Indication: Splenic infarct  Allergies  Allergen Reactions  . Amoxicillin Itching  . Dulaglutide     Other reaction(s): vomiting  . Miconazole Nitrate     Other reaction(s): rash  . Lantus [Insulin Glargine] Itching and Rash    Patient Measurements: Height: 6' (182.9 cm) Weight: 81.8 kg (180 lb 4.8 oz) IBW/kg (Calculated) : 73.1 Heparin Dosing Weight: actual body weight   Vital Signs: Temp: 98.1 F (36.7 C) (01/22 0614) Temp Source: Oral (01/22 0614) BP: 150/85 (01/22 0614) Pulse Rate: 89 (01/22 0614)  Labs: Recent Labs    08/24/20 1141 08/24/20 1850 08/25/20 0141 08/25/20 0817 08/26/20 0047  HGB 8.3*  --   --  7.8* 7.6*  HCT 25.8*  --   --  24.9* 23.7*  PLT 348  --   --  332 348  HEPARINUNFRC  --    < > 0.33 0.47 0.41  CREATININE 0.74  --  0.97  --  1.18*   < > = values in this interval not displayed.   Estimated Creatinine Clearance: 65.8 mL/min (A) (by C-G formula based on SCr of 1.18 mg/dL (H)).  Medical History: Past Medical History:  Diagnosis Date  . Anemia   . Anxiety   . Arthritis   . Asthma   . CHF (congestive heart failure) ([place])   . Depression   . History of chicken pox   . Migraines   . Mood disorder (HCC)    anxiety  . Prurigo nodularis    with diabetic dermopathy  . Type 1 diabetes, uncontrolled, with neuropathy (HCC)    [place]    Medications: Not on any anticoagulants PTA  Assessment: Pt is a 43 yoF presenting with abdominal pain, N/V. CT Abdomen reveals splenic infarct. Pharmacy consulted to dose IV heparin.  No bolus due to low Hgb per earlier discussion with MD  Today, 08/26/20  Heparin level 0.41- therapeutic on 1650 units/hr  CBC: Hgb low/decreased to 8.3, Pltc WNL on 1/20; 1/21 results not drawn yet  SCr 0.97- increased from yesterday  No bleeding or infusion issues noted per nursing  Goal of Therapy:  Heparin level 0.3-0.7 units/mL Monitor platelets by  anticoagulation protocol: yes   Plan:   Continue heparin infusion at 1650 units/hr  CBC, heparin level daily while on heparin infusion  Monitor for signs of bleeding  F/U long-term AC plans  Minda Ditto PharmD 08/26/2020, 10:23 AM

## 2020-08-26 NOTE — Discharge Instructions (Signed)
Nausea, Adult Nausea is feeling sick to your stomach or feeling that you are about to throw up (vomit). Feeling sick to your stomach is usually not serious, but it may be an early sign of a more serious medical problem. As you feel sicker to your stomach, you may throw up. If you throw up, or if you are not able to drink enough fluids, there is a risk that you may lose too much water in your body (get dehydrated). If you lose too much water in your body, you may:  Feel tired.  Feel thirsty.  Have a dry mouth.  Have cracked lips.  Go pee (urinate) less often. Older adults and people who have other diseases or a weak body defense system (immune system) have a higher risk of losing too much water in the body. The main goals of treating this condition are:  To relieve your nausea.  To ensure your nausea occurs less often.  To prevent throwing up and losing too much fluid. Follow these instructions at home: Watch your symptoms for any changes. Tell your doctor about them. Follow these instructions as told by your doctor. Eating and drinking  Take an ORS (oral rehydration solution). This is a drink that is sold at pharmacies and stores.  Drink clear fluids in small amounts as you are able. These include: ? Water. ? Ice chips. ? Fruit juice that has water added (diluted fruit juice). ? Low-calorie sports drinks.  Eat bland, easy-to-digest foods in small amounts as you are able, such as: ? Bananas. ? Applesauce. ? Rice. ? Low-fat (lean) meats. ? Toast. ? Crackers.  Avoid drinking fluids that have a lot of sugar or caffeine in them. This includes energy drinks, sports drinks, and soda.  Avoid alcohol.  Avoid spicy or fatty foods.      General instructions  Take over-the-counter and prescription medicines only as told by your doctor.  Rest at home while you get better.  Drink enough fluid to keep your pee (urine) pale yellow.  Take slow and deep breaths when you feel  sick to your stomach.  Avoid food or things that have strong smells.  Wash your hands often with soap and water. If you cannot use soap and water, use hand sanitizer.  Make sure that all people in your home wash their hands well and often.  Keep all follow-up visits as told by your doctor. This is important. Contact a doctor if:  You feel sicker to your stomach.  You feel sick to your stomach for more than 2 days.  You throw up.  You are not able to drink fluids without throwing up.  You have new symptoms.  You have a fever.  You have a headache.  You have muscle cramps.  You have a rash.  You have pain while peeing.  You feel light-headed or dizzy. Get help right away if:  You have pain in your chest, neck, arm, or jaw.  You feel very weak or you pass out (faint).  You have throw up that is bright red or looks like coffee grounds.  You have bloody or black poop (stools) or poop that looks like tar.  You have a very bad headache, a stiff neck, or both.  You have very bad pain, cramping, or bloating in your belly (abdomen).  You have trouble breathing or you are breathing very quickly.  Your heart is beating very quickly.  Your skin feels cold and clammy.  You feel confused.  You have signs of losing too much water in your body, such as: ? Dark pee, very little pee, or no pee. ? Cracked lips. ? Dry mouth. ? Sunken eyes. ? Sleepiness. ? Weakness. These symptoms may be an emergency. Do not wait to see if the symptoms will go away. Get medical help right away. Call your local emergency services (911 in the [place] Do not drive yourself to the hospital. Summary  Nausea is feeling sick to your stomach or feeling that you are about to throw up (vomit).  If you throw up, or if you are not able to drink enough fluids, there is a risk that you may lose too much water in your body (get dehydrated).  Eat and drink what your doctor tells you. Take  over-the-counter and prescription medicines only as told by your doctor.  Contact a doctor right away if your symptoms get worse or you have new symptoms.  Keep all follow-up visits as told by your doctor. This is important. This information is not intended to replace advice given to you by your health care provider. Make sure you discuss any questions you have with your health care provider. Document Revised: 06/22/2019 Document Reviewed: 12/30/2017 [employer] Patient Education  2021 [employer].

## 2020-08-26 NOTE — Discharge Summary (Signed)
Physician Discharge Summary  [name] NFA:[id number] DOB: 1970-10-19 DOA: 08/24/2020  PCP: [name], PA-C  Admit date: 08/24/2020 Discharge date: 08/26/2020  Admitted From: Home Disposition: Home  Recommendations for Outpatient Follow-up:  1. Follow ups as below. 2. Please obtain CBC/BMP/Mag at follow up 3. Please follow up on the following pending results: None  Home Health: None required Equipment/Devices: None required  Discharge Condition: Stable CODE STATUS: Full code   Follow-up Information    [name], PA-C. Schedule an appointment as soon as possible for a visit in 1 week(s).   Specialty: Family Medicine Contact information: [address] [place] [address] [phone]        [name], MD. Schedule an appointment as soon as possible for a visit in 3 week(s).   Specialty: Cardiology Contact information: [address] [address] [phone]                Hospital Course: 50 year old female with PMH of DM-one, diastolic CHF, HTN, erosive gastritis, pleural effusions, and recent hospitalization with intractable nausea and vomiting from 1/3-1/13 returning with similar complaints and admitted for intractable nausea and vomiting.  Also found to have a splenic infarct and pleural effusion on noncontrast CT abdomen and pelvis. There was also concern about UTI and started on ceftriaxone.  CTA abdomen and pelvis without significant finding other than splenic infarct noted on CT without contrast.  Evaluated by general surgery who did not feel there is an indication for surgery or hemodilution as over 50% of his pain is intact.  Heparin drip discontinued.   Patient GI symptoms improved the next day and resolve on the day of discharge.  She was discharged on p.o. Reglan for possible gastroparesis related to diabetes.  Echocardiogram with LVEF of 40 to 45% (previously 50 to 55%),  indeterminate diastolic dysfunction, global hypokinesis and moderately elevated PASP, moderate LAE (improved).  2 view chest x-ray with mild bilateral pleural effusion.  Patient had no cardiopulmonary symptoms.  She was discharged on home torsemide.  Home Coreg increased to 12.5 mg twice daily.  She was encouraged to follow-up with her cardiologist.  Patient was started on ceftriaxone for possible UTI although she had no UTI symptoms.  Ceftriaxone discontinued after 2 doses.  See individual problem list below for more on hospital course.   Discharge Diagnoses:  Intractable nausea/vomiting/abdominal pain-potential causes include gastroparesis from diabetes or erosive gastritis.  She had EGD earlier this month that showed erosive esophagitis and chronic gastritis.  H. pylori testing was negative.  CT abdomen and pelvis without significant finding other than splenic infarct.  Symptoms improved with IV Reglan and IV fluid hydration. -Reduce IV Reglan to 5 mg every 6 hours.  Will eventually changed to p.o. -Continue Protonix and Carafate -Advance diet as tolerated  E. coli bacteriuria versus UTI-UA with many bacteria and trace leukocytes but no nitrites.  Urine culture with E. coli but patient without UTI symptoms.  Received 2 doses of ceftriaxone.  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  Reports history of sickle cell trait.  No surgical indication per general surgery since 50% of the spleen is intact. CTA abdomen and pelvis with patent intra-abdominal vessels and no DVT.  -Check EBV IgM -Discontinue heparin drip -No need for vaccination.  Uncontrolled IDDM-1 with hypoglycemia and hyperglycemia-not in DKA.  A1c 7.2%. Recent Labs  Lab 08/26/20 0044 08/26/20 0310 08/26/20 0729 08/26/20 4010  08/26/20 1140  GLUCAP 169* 91 63* 105* 105*  -Discharge on home medications.  Chronic diastolic CHF: with LVEF of 40 to 45% (previously 50 to 55%), indeterminate  diastolic dysfunction, global hypokinesis and moderately elevated PASP, moderate LAE (improved).   Chest x-ray with mild bilateral pleural effusion.  BNP 237.  No cardiopulmonary symptoms. -Continue home torsemide 20 mg daily -Increase Coreg to 12.5 mg twice daily -Counseled on sodium and fluid restriction. -Outpatient follow-up with cardiology  Bilateral pleural effusion: Reported as moderate on CT abdomen and pelvis but found to be mild on two-view chest x-ray.  Likely due to CHF.  No cardiopulmonary symptoms.  -Diuretics as above  Hx of erosive esophagitis and chronic gastritis-noted on recent EGD earlier this month.  H. pylori testing negative. -Continue Protonix twice daily and Carafate  Essential HTN: Slightly elevated BP. -Continue home torsemide -Increased home Coreg to 12.5 mg twice daily.  Normocytic anemia-anemia panel consistent with anemia of chronic disease.  Relatively stable. -Recheck CBC at follow-up  Hypomagnesemia: Mg 1.6.  Replenished prior to discharge.  HLD -Continue home statin.    Body mass index is 24.45 kg/m.            Discharge Exam: Vitals:   08/26/20 0614 08/26/20 0846  BP: (!) 150/85 (!) 148/79  Pulse: 89 95  Resp: 20   Temp: 98.1 F (36.7 C)   SpO2: 97%     GENERAL: No apparent distress.  Nontoxic. HEENT: MMM.  Vision and hearing grossly intact.  NECK: Supple.  No apparent JVD.  RESP: On RA.  No IWOB.  Fair aeration bilaterally. CVS:  RRR. Heart sounds normal.  ABD/GI/GU: Bowel sounds present. Soft. Non tender.  MSK/EXT:  Moves extremities. No apparent deformity. No edema.  SKIN: no apparent skin lesion or wound NEURO: Awake, alert and oriented appropriately.  No apparent focal neuro deficit. PSYCH: Calm. Normal affect.  Discharge Instructions  Discharge Instructions    (HEART FAILURE PATIENTS) Call MD:  Anytime you have any of the following symptoms: 1) 3 pound weight gain in 24 hours or 5 pounds in 1 week 2) shortness  of breath, with or without a dry hacking cough 3) swelling in the hands, feet or stomach 4) if you have to sleep on extra pillows at night in order to breathe.   Complete by: As directed    Call MD for:  difficulty breathing, headache or visual disturbances   Complete by: As directed    Call MD for:  persistant dizziness or light-headedness   Complete by: As directed    Call MD for:  persistant nausea and vomiting   Complete by: As directed    Call MD for:  severe uncontrolled pain   Complete by: As directed    Diet - low sodium heart healthy   Complete by: As directed    Diet Carb Modified   Complete by: As directed    Discharge instructions   Complete by: As directed    It has been a pleasure taking care of you!  You were hospitalized due to intractable nausea, vomiting and abdominal pain.  This could be due to esophagitis/gastritis or diabetes.  Your symptoms improved to the point we think it is safe to let you go home and follow-up with your primary care doctor.  We have changed with nausea/vomiting medications to Reglan which is more helpful in patients with diabetes.  We also recommend you take your Protonix and Carafate as prescribed.  You may follow-up with  your cardiologist in 2 to 3 weeks as well.   In regards to your heart failure,  it is important that you take your medications as prescribed, avoid alcohol or over-the-counter pain medication other than plain Tylenol, limit the amount of water/fluid you drink to less than 6 cups (1500 cc) a day,  limit your sodium (salt) intake to less than 2 g (2000 mg) a day and weigh yourself daily at the same time and keeping your weight log.      Take care,   Increase activity slowly   Complete by: As directed      Allergies as of 08/26/2020      Reactions   Amoxicillin Itching   Dulaglutide    Other reaction(s): vomiting   Miconazole Nitrate    Other reaction(s): rash   Lantus [insulin Glargine] Itching, Rash      Medication  List    STOP taking these medications   ondansetron 4 MG disintegrating tablet Commonly known as: ZOFRAN-ODT   promethazine 12.5 MG tablet Commonly known as: PHENERGAN     TAKE these medications   albuterol 108 (90 Base) MCG/ACT inhaler Commonly known as: ProAir HFA Inhale 2 puffs into the lungs every 6 (six) hours as needed for wheezing or shortness of breath.   atorvastatin 10 MG tablet Commonly known as: LIPITOR Take 10 mg by mouth daily.   carvedilol 12.5 MG tablet Commonly known as: COREG Take 0.5 tablets (6.25 mg total) by mouth 2 (two) times daily. What changed: medication strength   feeding supplement (GLUCERNA SHAKE) Liqd Take 237 mLs by mouth 2 (two) times daily between meals.   FreeStyle Libre 2 Sensor Misc CHANGE EVERY 14 DAYS   hydrOXYzine 25 MG tablet Commonly known as: ATARAX/VISTARIL Take 1 tablet (25 mg total) by mouth 3 (three) times daily as needed. What changed: reasons to take this   LORazepam 0.5 MG tablet Commonly known as: ATIVAN TAKE 1 TABLET BY MOUTH EVERY 8 HOURS AS NEEDED FOR ANXIETY. What changed:   reasons to take this  additional instructions   metoCLOPramide 5 MG tablet Commonly known as: Reglan Take 1 tablet (5 mg total) by mouth every 8 (eight) hours as needed for up to 20 days for nausea or vomiting.   NovoLOG 100 UNIT/ML injection Generic drug: insulin aspart SMARTSIG:0-120 Unit(s) SUB-Q Daily   OmniPod 10 Pack Misc by Does not apply route.   pantoprazole 40 MG tablet Commonly known as: PROTONIX Take 1 tablet (40 mg total) by mouth 2 (two) times daily.   sucralfate 1 GM/10ML suspension Commonly known as: CARAFATE Take 10 mLs (1 g total) by mouth 4 (four) times daily -  before meals and at bedtime.   torsemide 20 MG tablet Commonly known as: DEMADEX TAKE 1 TABLET (20 MG TOTAL) BY MOUTH EVERY MONDAY, WEDNESDAY, AND FRIDAY.       Consultations:  None  Procedures/Studies:  2D Echo on 08/25/2020 1. Left  ventricular ejection fraction, by estimation, is 40 to 45%. The  left ventricle has mildly decreased function. The left ventricle  demonstrates global hypokinesis. The left ventricular internal cavity size  was mildly dilated. Left ventricular  diastolic parameters are indeterminate.  2. Right ventricular systolic function is normal. The right ventricular  size is normal. There is moderately elevated pulmonary artery systolic  pressure.  3. Left atrial size was moderately dilated.  4. Large pleural effusion in the left lateral region.  5. The mitral valve is normal in structure. Mild mitral valve  regurgitation. No evidence of mitral stenosis.  6. The aortic valve is tricuspid. Aortic valve regurgitation is not  visualized. No aortic stenosis is present.  7. The inferior vena cava is dilated in size with <50% respiratory  variability, suggesting right atrial pressure of 15 mmHg.    DG Chest 2 View  Result Date: 08/25/2020 CLINICAL DATA:  Persistent nausea. EXAM: CHEST - 2 VIEW COMPARISON:  08/12/2020 FINDINGS: The heart is within normal limits in size and stable. The mediastinal and hilar contours are stable. Persistent small bilateral pleural effusions and bibasilar atelectasis. Vascular congestion and mild interstitial pulmonary edema noted. No pneumothorax. The bony thorax is intact. IMPRESSION: CHF with small bilateral pleural effusions and bibasilar atelectasis. Electronically Signed   By: Marijo Sanes M.D.   On: 08/25/2020 12:07   DG Chest 2 View  Result Date: 08/12/2020 CLINICAL DATA:  Chest pain, recurrent emesis. Possible rib fracture/contusion EXAM: CHEST - 2 VIEW COMPARISON:  Chest x-ray dated 08/11/2020. FINDINGS: Heart size and mediastinal contours are within normal limits. Patchy opacities within the LEFT lower lung similar to the recent chest x-ray. Small LEFT pleural effusion. Mild wedging of several midthoracic vertebral bodies, likely chronic. Osseous structures about  the chest are otherwise unremarkable. IMPRESSION: 1. Patchy opacities within the LEFT lower lung, suspicious for pneumonia, similar to yesterday's chest x-ray. 2. Small LEFT pleural effusion. 3. No rib fracture or displacement is seen. Electronically Signed   By: Franki Cabot M.D.   On: 08/12/2020 12:20   CT CHEST WO CONTRAST  Result Date: 08/14/2020 CLINICAL DATA:  Respiratory illness EXAM: CT CHEST WITHOUT CONTRAST TECHNIQUE: Multidetector CT imaging of the chest was performed following the standard protocol without IV contrast. COMPARISON:  Chest radiographs, 08/12/2020 FINDINGS: Cardiovascular: No significant vascular findings. Normal heart size. No pericardial effusion. Mediastinum/Nodes: No enlarged mediastinal, hilar, or axillary lymph nodes. Thyroid gland, trachea, and esophagus demonstrate no significant findings. Lungs/Pleura: Moderate bilateral pleural effusions and associated atelectasis or consolidation. There is mild interlobular septal thickening. Minimal biapical pleuroparenchymal scarring. Upper Abdomen: No acute abnormality. Small volume ascites in the included upper abdomen. Musculoskeletal: No chest wall mass or suspicious bone lesions identified. IMPRESSION: 1. Moderate bilateral pleural effusions and associated atelectasis or consolidation. 2.  Mild interlobular septal thickening, consistent with edema. 3.  Small volume ascites in the included upper abdomen. Electronically Signed   By: Eddie Candle M.D.   On: 08/14/2020 08:17   CT Abdomen Pelvis W Contrast  Result Date: 08/24/2020 CLINICAL DATA:  Left lower quadrant abdominal pain. EXAM: CT ABDOMEN AND PELVIS WITH CONTRAST TECHNIQUE: Multidetector CT imaging of the abdomen and pelvis was performed using the standard protocol following bolus administration of intravenous contrast. CONTRAST:  124mL OMNIPAQUE IOHEXOL 300 MG/ML  SOLN COMPARISON:  CT chest dated August 14, 2020. CT dated August 08, 2020 FINDINGS: Lower chest: There are  moderate-sized bilateral pleural effusions, significantly increased in size since the prior study.The heart is enlarged. Again noted is interlobular septal thickening Hepatobiliary: The liver is normal. Normal gallbladder.There is no biliary ductal dilation. Pancreas: Normal contours without ductal dilatation. No peripancreatic fluid collection. Spleen: There is a new large hypoattenuating defect in the spleen measuring approximately 8 cm. This is consistent with interval development of a splenic infarct. The splenic artery and vein appear grossly patent where visualized. However the distal splenic artery is difficult to evaluate secondary to contrast timing. Adrenals/Urinary Tract: --Adrenal glands: Unremarkable. --Right kidney/ureter: No hydronephrosis or radiopaque kidney stones. --Left kidney/ureter: No hydronephrosis or radiopaque kidney  stones. --Urinary bladder: Unremarkable. Stomach/Bowel: --Stomach/Duodenum: No hiatal hernia or other gastric abnormality. Normal duodenal course and caliber. --Small bowel: Unremarkable. --Colon: Unremarkable. --Appendix: Normal. Vascular/Lymphatic: Normal course and caliber of the major abdominal vessels. --No retroperitoneal lymphadenopathy. --No mesenteric lymphadenopathy. --No pelvic or inguinal lymphadenopathy. Reproductive: Unremarkable Other: There is a small volume of free fluid the patient's pelvis. No free air. There is a fat containing umbilical hernia. Musculoskeletal. No acute displaced fractures. IMPRESSION: 1. New large hypoattenuating defect in the spleen measuring approximately 8 cm. This is consistent with interval development of a splenic infarct. The splenic artery and vein appear grossly patent where visualized. However the distal splenic artery is difficult to evaluate secondary to contrast timing. 2. Moderate-sized bilateral pleural effusions, significantly increased in size since the prior study. 3. Cardiomegaly with interlobular septal thickening,  consistent with pulmonary edema. 4. Small volume of free fluid the patient's pelvis. 5. Fat containing umbilical hernia. Electronically Signed   By: Constance Holster M.D.   On: 08/24/2020 04:46   CT Abdomen Pelvis W Contrast  Result Date: 08/08/2020 CLINICAL DATA:  Abdominal pain. EXAM: CT ABDOMEN AND PELVIS WITH CONTRAST TECHNIQUE: Multidetector CT imaging of the abdomen and pelvis was performed using the standard protocol following bolus administration of intravenous contrast. CONTRAST:  128mL OMNIPAQUE IOHEXOL 300 MG/ML  SOLN COMPARISON:  October 11, 2019 FINDINGS: Lower chest: No acute abnormality. Hepatobiliary: No focal liver abnormality is seen. No gallstones, gallbladder wall thickening, or biliary dilatation. Pancreas: Unremarkable. No pancreatic ductal dilatation or surrounding inflammatory changes. Spleen: Normal in size without focal abnormality. Adrenals/Urinary Tract: Adrenal glands are unremarkable. Kidneys are normal, without renal calculi, focal lesion, or hydronephrosis. Bladder is unremarkable. Stomach/Bowel: There is a small, stable hiatal hernia. Mild diffuse distal esophageal wall thickening is noted. This represents a new finding when compared to the prior study. Appendix appears normal. No evidence of bowel dilatation. Noninflamed diverticula are seen throughout the sigmoid colon. Vascular/Lymphatic: No significant vascular findings are present. No enlarged abdominal or pelvic lymph nodes. Reproductive: A predominantly stable 2.1 cm x 2.3 cm mildly hyperdense soft tissue mass is seen within the uterus on the right. This is present on the prior exam. Other: There is a stable 2.1 cm x 1.5 cm fat containing umbilical hernia. A trace amount of posterior pelvic free fluid is noted. Musculoskeletal: No acute or significant osseous findings. IMPRESSION: 1. Small, stable hiatal hernia with additional findings which may represent mild esophagitis. 2. Sigmoid diverticulosis. 3. Stable hyperdense  uterine fibroid. 4. Trace amount of posterior pelvic free fluid, likely physiologic. Electronically Signed   By: Virgina Norfolk M.D.   On: 08/08/2020 22:09   DG CHEST PORT 1 VIEW  Result Date: 08/11/2020 CLINICAL DATA:  Emesis and left-sided chest pain. Status post EGD today. EXAM: PORTABLE CHEST 1 VIEW COMPARISON:  June 12, 2020 FINDINGS: The heart, hila, and mediastinum are normal. No mediastinal air seen on this study. No pneumothorax. Haziness is seen centrally on the right in the infrahilar region. There appears to be opacity in the left retrocardiac region. No other acute abnormalities. IMPRESSION: 1. There appears to be infiltrate in the left retrocardiac region which could represent atelectasis, pneumonia, or aspiration given history. 2. Haziness in the right perihilar region could be due to atelectasis or developing infiltrate. 3. Recommend a PA and lateral chest x-ray when the patient is able for better evaluation. Electronically Signed   By: Dorise Bullion III M.D   On: 08/11/2020 15:05   ECHOCARDIOGRAM COMPLETE  Result  Date: 08/25/2020    ECHOCARDIOGRAM REPORT   Patient Name:   [name] Date of Exam: 08/25/2020 Medical Rec #:  [id number]               Height:       72.0 in Accession #:    [id number]              Weight:       180.3 lb Date of Birth:  [birth year]              BSA:          2.039 m Patient Age:    50 years                BP:           145/96 mmHg Patient Gender: F                       HR:           89 bpm. Exam Location:  Inpatient Procedure: 2D Echo, Cardiac Doppler and Color Doppler Indications:    Splenic infarct  History:        Patient has prior history of Echocardiogram examinations, most                 recent 12/08/2019. CHF; Risk Factors:Diabetes, Hypertension and                 Dyslipidemia. Sickle cell, bilateral pleural effusions.  Sonographer:    Dustin Flock Referring Phys: [id number] [place]  1. Left ventricular ejection  fraction, by estimation, is 40 to 45%. The left ventricle has mildly decreased function. The left ventricle demonstrates global hypokinesis. The left ventricular internal cavity size was mildly dilated. Left ventricular diastolic parameters are indeterminate.  2. Right ventricular systolic function is normal. The right ventricular size is normal. There is moderately elevated pulmonary artery systolic pressure.  3. Left atrial size was moderately dilated.  4. Large pleural effusion in the left lateral region.  5. The mitral valve is normal in structure. Mild mitral valve regurgitation. No evidence of mitral stenosis.  6. The aortic valve is tricuspid. Aortic valve regurgitation is not visualized. No aortic stenosis is present.  7. The inferior vena cava is dilated in size with <50% respiratory variability, suggesting right atrial pressure of 15 mmHg. FINDINGS  Left Ventricle: Left ventricular ejection fraction, by estimation, is 40 to 45%. The left ventricle has mildly decreased function. The left ventricle demonstrates global hypokinesis. The left ventricular internal cavity size was mildly dilated. There is  no left ventricular hypertrophy. Left ventricular diastolic parameters are indeterminate. Indeterminate filling pressures. Right Ventricle: The right ventricular size is normal. No increase in right ventricular wall thickness. Right ventricular systolic function is normal. There is moderately elevated pulmonary artery systolic pressure. The tricuspid regurgitant velocity is 2.84 m/s, and with an assumed right atrial pressure of 15 mmHg, the estimated right ventricular systolic pressure is 26.8 mmHg. Left Atrium: Left atrial size was moderately dilated. Right Atrium: Right atrial size was normal in size. Pericardium: There is no evidence of pericardial effusion. Mitral Valve: The mitral valve is normal in structure. Mild mitral valve regurgitation. No evidence of mitral valve stenosis. Tricuspid Valve: The  tricuspid valve is normal in structure. Tricuspid valve regurgitation is mild . No evidence of tricuspid stenosis. Aortic Valve: The aortic valve is tricuspid. Aortic valve regurgitation is not visualized. No aortic stenosis is present.  Pulmonic Valve: The pulmonic valve was normal in structure. Pulmonic valve regurgitation is not visualized. No evidence of pulmonic stenosis. Aorta: The aortic root is normal in size and structure. Venous: The inferior vena cava is dilated in size with less than 50% respiratory variability, suggesting right atrial pressure of 15 mmHg. IAS/Shunts: No atrial level shunt detected by color flow Doppler. Additional Comments: There is a large pleural effusion in the left lateral region. Moderate ascites is present.  LEFT VENTRICLE PLAX 2D LVIDd:         5.20 cm  Diastology LVIDs:         4.10 cm  LV e' medial:    7.40 cm/s LV PW:         1.00 cm  LV E/e' medial:  12.5 LV IVS:        0.90 cm  LV e' lateral:   8.59 cm/s LVOT diam:     2.00 cm  LV E/e' lateral: 10.7 LV SV:         63 LV SV Index:   31 LVOT Area:     3.14 cm  RIGHT VENTRICLE RV Basal diam:  2.60 cm RV S prime:     12.30 cm/s TAPSE (M-mode): 2.2 cm LEFT ATRIUM             Index       RIGHT ATRIUM           Index LA diam:        4.30 cm 2.11 cm/m  RA Area:     11.30 cm LA Vol (A2C):   70.3 ml 34.48 ml/m RA Volume:   23.90 ml  11.72 ml/m LA Vol (A4C):   65.2 ml 31.98 ml/m LA Biplane Vol: 71.1 ml 34.88 ml/m  AORTIC VALVE LVOT Vmax:   91.60 cm/s LVOT Vmean:  65.000 cm/s LVOT VTI:    0.200 m  AORTA Ao Root diam: 2.90 cm MITRAL VALVE               TRICUSPID VALVE MV Area (PHT): 4.89 cm    TR Peak grad:   32.3 mmHg MV Decel Time: 155 msec    TR Vmax:        284.00 cm/s MV E velocity: 92.20 cm/s MV A velocity: 28.10 cm/s  SHUNTS MV E/A ratio:  3.28        Systemic VTI:  0.20 m                            Systemic Diam: 2.00 cm Skeet Latch MD Electronically signed by Skeet Latch MD Signature Date/Time: 08/25/2020/2:45:13  PM    Final    CT Angio Abd/Pel w/ and/or w/o  Result Date: 08/24/2020 CLINICAL DATA:  Splenic infarct.  Sickle cell trait. EXAM: CTA ABDOMEN AND PELVIS WITHOUT AND WITH CONTRAST TECHNIQUE: Multidetector CT imaging of the abdomen and pelvis was performed using the standard protocol during bolus administration of intravenous contrast. Multiplanar reconstructed images and MIPs were obtained and reviewed to evaluate the vascular anatomy. CONTRAST:  127mL OMNIPAQUE IOHEXOL 350 MG/ML SOLN COMPARISON:  08/14/2020 FINDINGS: VASCULAR Aorta: Normal caliber aorta without aneurysm, dissection, vasculitis or significant stenosis. Celiac: Patent without evidence of aneurysm, dissection, vasculitis or significant stenosis. SMA: Patent without evidence of aneurysm, dissection, vasculitis or significant stenosis. Renals: Both renal arteries are patent without evidence of aneurysm, dissection, vasculitis, fibromuscular dysplasia or significant stenosis. IMA: Patent without evidence of aneurysm, dissection, vasculitis or  significant stenosis. Inflow: Patent without evidence of aneurysm, dissection, vasculitis or significant stenosis. Proximal Outflow: Bilateral common femoral and visualized portions of the superficial and profunda femoral arteries are patent without evidence of aneurysm, dissection, vasculitis or significant stenosis. Veins: Portal, splenic, and supervised surg veins are patent. Review of the MIP images confirms the above findings. NON-VASCULAR Lower chest: Small to moderate bilateral pleural effusions again seen. Hepatobiliary: No focal liver abnormality is seen. No gallstones, gallbladder wall thickening, or biliary dilatation. Pancreas: Unremarkable. No pancreatic ductal dilatation or surrounding inflammatory changes. Spleen: Nonenhancing regions of the spleen are similar to that seen on the prior examination. Findings are consistent with infarct. Splenic artery and veins are patent. No splenic artery aneurysm  identified. Adrenals/Urinary Tract: No significant abnormality of the kidneys and ureters. Adrenal glands are unremarkable. There is diffuse bladder wall thickening which may be partly due to under distension, however similar findings can be seen with chronic cystitis and outlet obstruction. Stomach/Bowel: Stomach is within normal limits. Appendix appears normal. No evidence of bowel wall thickening, distention, or inflammatory changes. Lymphatic: No enlarged lymph nodes. Reproductive: Uterus and bilateral adnexa are unremarkable. Other: Small fat containing umbilical hernia. Mild diffuse body wall edema. Musculoskeletal: No acute or significant osseous findings. IMPRESSION: VASCULAR Splenic artery and vein are patent. No splenic artery aneurysm. No significant stenosis of splenic or celiac artery. NON-VASCULAR Redemonstration of splenic infarct. Electronically Signed   By: Miachel Roux M.D.   On: 08/24/2020 14:37       The results of significant diagnostics from this hospitalization (including imaging, microbiology, ancillary and laboratory) are listed below for reference.     Microbiology: Recent Results (from the past 240 hour(s))  SARS CORONAVIRUS 2 (TAT 6-24 HRS) Nasopharyngeal Nasopharyngeal Swab     Status: None   Collection Time: 08/24/20  5:23 AM   Specimen: Nasopharyngeal Swab  Result Value Ref Range Status   SARS Coronavirus 2 NEGATIVE NEGATIVE Final    Comment: (NOTE) SARS-CoV-2 target nucleic acids are NOT DETECTED.  The SARS-CoV-2 RNA is generally detectable in upper and lower respiratory specimens during the acute phase of infection. Negative results do not preclude SARS-CoV-2 infection, do not rule out co-infections with other pathogens, and should not be used as the sole basis for treatment or other patient management decisions. Negative results must be combined with clinical observations, patient history, and epidemiological information. The expected result is  Negative.  Fact Sheet for Patients: SugarRoll.be  Fact Sheet for Healthcare Providers: https://www.woods-mathews.com/  This test is not yet approved or cleared by the [place] FDA and  has been authorized for detection and/or diagnosis of SARS-CoV-2 by FDA under an Emergency Use Authorization (EUA). This EUA will remain  in effect (meaning this test can be used) for the duration of the COVID-19 declaration under Se ction 564(b)(1) of the Act, 21 U.S.C. section 360bbb-3(b)(1), unless the authorization is terminated or revoked sooner.  Performed at [facility], [place] [address], [place], [place] [address]   Urine culture     Status: Abnormal   Collection Time: 08/24/20 11:41 AM   Specimen: Urine, Clean Catch  Result Value Ref Range Status   Specimen Description   Final    URINE, CLEAN CATCH Performed at [facility], [place] [address] [place], [place] [address]    Special Requests   Final    NONE Performed at [facility], [place] [address] [place], [place] [address]    Culture >=100,000 COLONIES/mL ESCHERICHIA  COLI (A)  Final   Report Status 08/26/2020 FINAL  Final   Organism ID, Bacteria ESCHERICHIA COLI (A)  Final      Susceptibility   Escherichia coli - MIC*    AMPICILLIN <=2 SENSITIVE Sensitive     CEFAZOLIN <=4 SENSITIVE Sensitive     CEFEPIME <=0.12 SENSITIVE Sensitive     CEFTRIAXONE <=0.25 SENSITIVE Sensitive     CIPROFLOXACIN <=0.25 SENSITIVE Sensitive     GENTAMICIN <=1 SENSITIVE Sensitive     IMIPENEM <=0.25 SENSITIVE Sensitive     NITROFURANTOIN <=16 SENSITIVE Sensitive     TRIMETH/SULFA <=20 SENSITIVE Sensitive     AMPICILLIN/SULBACTAM <=2 SENSITIVE Sensitive     PIP/TAZO <=4 SENSITIVE Sensitive     * >=100,000 COLONIES/mL ESCHERICHIA COLI     Labs: BNP (last 3 results) Recent Labs    08/09/20 0148 08/23/20 1702 08/26/20 0047  BNP 588.9* 350.6* 237.9*   Basic  Metabolic Panel: Recent Labs  Lab 08/23/20 1702 08/24/20 1141 08/25/20 0141 08/26/20 0047  NA 142 143 138 140  K 3.8 3.4* 3.8 3.6  CL 106 111 107 106  CO2 $Re'27 24 24 26  'NmG$ GLUCOSE 229* 113* 122* 183*  BUN $Re'11 7 7 7  'vlp$ CREATININE 1.00 0.74 0.97 1.18*  CALCIUM 8.7* 7.3* 8.0* 7.9*  MG  --   --   --  1.6*  PHOS  --   --   --  4.0   Liver Function Tests: Recent Labs  Lab 08/23/20 1702 08/24/20 1141 08/25/20 0141 08/26/20 0047  AST 9* 8* 15  --   ALT $Re'11 8 9  'kyl$ --   ALKPHOS 76 61 66  --   BILITOT 0.7 0.3 0.5  --   PROT 5.8* 4.9* 5.3*  --   ALBUMIN 2.2* 1.8* 1.9* 1.9*   Recent Labs  Lab 08/23/20 1702  LIPASE 17   No results for input(s): AMMONIA in the last 168 hours. CBC: Recent Labs  Lab 08/23/20 1702 08/24/20 1141 08/25/20 0817 08/26/20 0047  WBC 5.4 4.5 4.5 4.9  NEUTROABS  --  2.8  --   --   HGB 9.0* 8.3* 7.8* 7.6*  HCT 28.1* 25.8* 24.9* 23.7*  MCV 92.4 92.5 92.6 92.2  PLT 365 348 332 348   Cardiac Enzymes: No results for input(s): CKTOTAL, CKMB, CKMBINDEX, TROPONINI in the last 168 hours. BNP: Invalid input(s): POCBNP CBG: Recent Labs  Lab 08/26/20 0044 08/26/20 0310 08/26/20 0729 08/26/20 0826 08/26/20 1140  GLUCAP 169* 91 63* 105* 105*   D-Dimer No results for input(s): DDIMER in the last 72 hours. Hgb A1c Recent Labs    08/26/20 0047  HGBA1C 7.2*   Lipid Profile No results for input(s): CHOL, HDL, LDLCALC, TRIG, CHOLHDL, LDLDIRECT in the last 72 hours. Thyroid function studies No results for input(s): TSH, T4TOTAL, T3FREE, THYROIDAB in the last 72 hours.  Invalid input(s): FREET3 Anemia work up Recent Labs    08/26/20 0047  VITAMINB12 371  FOLATE 6.6  FERRITIN 175  TIBC 186*  IRON 33  RETICCTPCT 2.2   Urinalysis    Component Value Date/Time   COLORURINE YELLOW 08/24/2020 0456   APPEARANCEUR HAZY (A) 08/24/2020 0456   LABSPEC 1.017 08/24/2020 0456   PHURINE 6.0 08/24/2020 0456   GLUCOSEU NEGATIVE 08/24/2020 0456   HGBUR SMALL  (A) 08/24/2020 0456   BILIRUBINUR NEGATIVE 08/24/2020 0456   BILIRUBINUR negative 04/23/2019 [place] 08/24/2020 0456   PROTEINUR >=300 (A) 08/24/2020 0456   UROBILINOGEN 0.2 04/23/2019 1658  UROBILINOGEN 1.0 08/11/2008 1745   NITRITE NEGATIVE 08/24/2020 0456   LEUKOCYTESUR TRACE (A) 08/24/2020 0456   Sepsis Labs Invalid input(s): PROCALCITONIN,  WBC,  LACTICIDVEN   Time coordinating discharge: 45 minutes  SIGNED:  Mercy Riding, MD  [facility] 08/26/2020, 4:07 PM  If 7PM-7AM, please contact night-coverage [email or web address]

## 2020-08-26 NOTE — Progress Notes (Signed)
Pt for d/c home. Verbalized understanding of instruction given. Cont with plan of care

## 2020-08-26 NOTE — Progress Notes (Signed)
    CC: Persistent nausea and vomiting, abdominal pain  Subjective: Pt tolerating soft diet.  No n/v.  Pain has significantly improved.  Passing gas and having BMs.    Objective: Vital signs in last 24 hours: Temp:  [98.1 F (36.7 C)-98.3 F (36.8 C)] 98.1 F (36.7 C) (01/22 0614) Pulse Rate:  [89-96] 95 (01/22 0846) Resp:  [20-24] 20 (01/22 0614) BP: (141-150)/(79-85) 148/79 (01/22 0846) SpO2:  [95 %-97 %] 97 % (01/22 0614) Last BM Date: 08/25/20  Intake/Output from previous day: 01/21 0701 - 01/22 0700 In: 953.9 [P.O.:420; I.V.:433.9; IV Piggyback:100] Out: -  Intake/Output this shift: Total I/O In: 240 [P.O.:240] Out: -   General appearance: alert, cooperative and no distress Resp:  Breathing comfortably GI: Soft, non tender, non distended.   Lab Results:  Recent Labs    08/25/20 0817 08/26/20 0047  WBC 4.5 4.9  HGB 7.8* 7.6*  HCT 24.9* 23.7*  PLT 332 348    BMET Recent Labs    08/25/20 0141 08/26/20 0047  NA 138 140  K 3.8 3.6  CL 107 106  CO2 24 26  GLUCOSE 122* 183*  BUN 7 7  CREATININE 0.97 1.18*  CALCIUM 8.0* 7.9*   PT/INR No results for input(s): LABPROT, INR in the last 72 hours.  Recent Labs  Lab 08/23/20 1702 08/24/20 1141 08/25/20 0141 08/26/20 0047  AST 9* 8* 15  --   ALT $Re'11 8 9  'Xgn$ --   ALKPHOS 76 61 66  --   BILITOT 0.7 0.3 0.5  --   PROT 5.8* 4.9* 5.3*  --   ALBUMIN 2.2* 1.8* 1.9* 1.9*     Lipase     Component Value Date/Time   LIPASE 17 08/23/2020 1702     Medications: . atorvastatin  10 mg Oral Daily  . carvedilol  12.5 mg Oral BID  . enoxaparin (LOVENOX) injection  40 mg Subcutaneous Q24H  . insulin aspart  0-9 Units Subcutaneous Q4H  . insulin detemir  18 Units Subcutaneous QHS  . metoCLOPramide (REGLAN) injection  5 mg Intravenous Q6H  . pantoprazole  40 mg Oral BID  . sucralfate  1 g Oral TID AC & HS  . torsemide  20 mg Oral Q M,W,F    Assessment/Plan Diabetes - Insulin dependent Bilateral pleural  effusions  UTI Hx Sickle cell trait Erosive esophagitis 08/11/20 Hypertension Hyperlipidemia Hx diabetic neuropathy Hx mood disorder Chronic constipation Recurrent intractable nausea and vomiting    Splenic infarction  FEN:  diet as tolerated.   ID:  None DVT:  None  Plan: pain improved. Has some spleen still perfused.  Home per medicine when stable.    Follow up with surgery as needed.       LOS: 2 days    Milus Height, MD FACS Surgical Oncology, General Surgery, Trauma and [facility] [facility], [place] for weekday/non holidays Check [email or web address] for coverage night/weekend/holidays  Do not use SecureChat as it is not reliable for timely patient care.

## 2020-08-27 LAB — EPSTEIN-BARR VIRUS VCA, IGM: EBV VCA IgM: <36 U/mL (ref 0.0–35.9)

## 2020-08-29 ENCOUNTER — Ambulatory Visit: Payer: BC Managed Care – PPO | Admitting: Physician Assistant

## 2020-08-30 ENCOUNTER — Encounter: Payer: Self-pay | Admitting: Physician Assistant

## 2020-08-30 ENCOUNTER — Other Ambulatory Visit: Payer: Self-pay

## 2020-08-30 ENCOUNTER — Ambulatory Visit (INDEPENDENT_AMBULATORY_CARE_PROVIDER_SITE_OTHER): Payer: BC Managed Care – PPO | Admitting: Physician Assistant

## 2020-08-30 ENCOUNTER — Inpatient Hospital Stay: Payer: BC Managed Care – PPO | Admitting: Physician Assistant

## 2020-08-30 VITALS — BP 124/70 | HR 83 | Temp 98.2°F | Resp 16 | Ht 72.0 in | Wt 173.0 lb

## 2020-08-30 DIAGNOSIS — I5032 Chronic diastolic (congestive) heart failure: Secondary | ICD-10-CM | POA: Diagnosis not present

## 2020-08-30 DIAGNOSIS — R112 Nausea with vomiting, unspecified: Secondary | ICD-10-CM | POA: Diagnosis not present

## 2020-08-30 DIAGNOSIS — E049 Nontoxic goiter, unspecified: Secondary | ICD-10-CM | POA: Diagnosis not present

## 2020-08-30 DIAGNOSIS — E1065 Type 1 diabetes mellitus with hyperglycemia: Secondary | ICD-10-CM

## 2020-08-30 DIAGNOSIS — E041 Nontoxic single thyroid nodule: Secondary | ICD-10-CM | POA: Diagnosis not present

## 2020-08-30 DIAGNOSIS — Z03818 Encounter for observation for suspected exposure to other biological agents ruled out: Secondary | ICD-10-CM | POA: Diagnosis not present

## 2020-08-30 DIAGNOSIS — E1140 Type 2 diabetes mellitus with diabetic neuropathy, unspecified: Secondary | ICD-10-CM | POA: Diagnosis not present

## 2020-08-30 DIAGNOSIS — Z20822 Contact with and (suspected) exposure to covid-19: Secondary | ICD-10-CM | POA: Diagnosis not present

## 2020-08-30 DIAGNOSIS — Z794 Long term (current) use of insulin: Secondary | ICD-10-CM | POA: Diagnosis not present

## 2020-08-30 LAB — POCT URINALYSIS DIPSTICK
Bilirubin, UA: NEGATIVE
Glucose, UA: NEGATIVE
Leukocytes, UA: NEGATIVE
Nitrite, UA: NEGATIVE
Protein, UA: POSITIVE — AB
Spec Grav, UA: 1.01 (ref 1.010–1.025)
Urobilinogen, UA: 0.2 E.U./dL
pH, UA: 7.5 (ref 5.0–8.0)

## 2020-08-30 NOTE — Patient Instructions (Addendum)
Please go to the lab today for blood work.  I will call you with your results. We will alter treatment regimen(s) if indicated by your results.   Please continue the Reglan as directed by the hospital provider. Increase your morning dose to 2 tablets (10 mg) for now.  Continue Protonix, Phenergan and Carafate as directed by your GI provider. Follow-up with them as scheduled.   Make sure to follow-up with Dr Tamala Julian ASAP.  I will work on filling out your paperwork for you.

## 2020-08-30 NOTE — Progress Notes (Signed)
Patient presents to clinic today for hospitalization follow-up. Patient presented back to the ER on 08/24/2020 at the request of this provider due to intractable nausea/vomiting and concern of dehydration. ER workup revealed tachycardia with signs of dehydration, hgb at 9.0, glucose 229. No electrolyte abnormalities noted. CT obtained revealing concern for splenic infarction, bilateral effusions, cardiomegaly with evidence of pulmonary edema and fat-containing umbilical hernia. Was given IV fluids, Reglan and Pepcid. EKG negative for a-fib, in sinus tach. UA with + LE, WBC and bacteria. Culture sent. Patient started on Rocephin. Hospitalist consulted for admission.   During admission, surgery consulted regarding splenic infarction. Decided no indication for surgery. Heparin stopped. Echo updated revealing LVEF at 40-45%, global hypokinesis and moderately elevated PASP, LAE. Patient started on torsemide. Coreg increased to 12.5 mg BID. Urine culture negative so Rocephin discontinued after 2 doses. GI symptoms resolved during admission with reglan. Patient remained stable so felt safe for discharge home on 08/26/2020 with close follow-up with PCP, GI and Cardiology.   Since discharge, patient endorses taking the medications given as directed. Notes the relgan has helped significantly but still having issue with dry heaving with meals. Occasional non-bloody emesis. Is hydrating better. Still notes some mild LUQ tenderness. Denies melena, hematochezia or tenesmus.    Past Medical History:  Diagnosis Date  . Anemia   . Anxiety   . Arthritis   . Asthma   . CHF (congestive heart failure) ([facility])   . Depression   . History of chicken pox   . Migraines   . Mood disorder (HCC)    anxiety  . Prurigo nodularis    with diabetic dermopathy  . Type 1 diabetes, uncontrolled, with neuropathy ([facility])    [place]    Current Outpatient Medications on File Prior to Visit  Medication Sig Dispense Refill  . albuterol  (PROAIR HFA) 108 (90 Base) MCG/ACT inhaler Inhale 2 puffs into the lungs every 6 (six) hours as needed for wheezing or shortness of breath. 6.7 g 1  . atorvastatin (LIPITOR) 10 MG tablet Take 10 mg by mouth daily.    . carvedilol (COREG) 12.5 MG tablet Take 0.5 tablets (6.25 mg total) by mouth 2 (two) times daily. 180 tablet 1  . Continuous Blood Gluc Sensor (FREESTYLE LIBRE 2 SENSOR) MISC CHANGE EVERY 14 DAYS    . feeding supplement, GLUCERNA SHAKE, (GLUCERNA SHAKE) LIQD Take 237 mLs by mouth 2 (two) times daily between meals.  0  . hydrOXYzine (ATARAX/VISTARIL) 25 MG tablet Take 1 tablet (25 mg total) by mouth 3 (three) times daily as needed. (Patient taking differently: Take 25 mg by mouth 3 (three) times daily as needed for anxiety or itching.) 60 tablet 2  . Insulin Disposable Pump (OMNIPOD 10 PACK) MISC by Does not apply route.    Marland Kitchen LORazepam (ATIVAN) 0.5 MG tablet TAKE 1 TABLET BY MOUTH EVERY 8 HOURS AS NEEDED FOR ANXIETY. (Patient taking differently: Take 0.5 mg by mouth every 8 (eight) hours as needed for anxiety.) 20 tablet 0  . metoCLOPramide (REGLAN) 5 MG tablet Take 1 tablet (5 mg total) by mouth every 8 (eight) hours as needed for up to 20 days for nausea or vomiting. 60 tablet 0  . NOVOLOG 100 UNIT/ML injection SMARTSIG:0-120 Unit(s) SUB-Q Daily    . pantoprazole (PROTONIX) 40 MG tablet Take 1 tablet (40 mg total) by mouth 2 (two) times daily. 60 tablet 0  . sucralfate (CARAFATE) 1 GM/10ML suspension Take 10 mLs (1 g total) by mouth 4 (four)  times daily -  before meals and at bedtime. 1200 mL 0  . torsemide (DEMADEX) 20 MG tablet TAKE 1 TABLET (20 MG TOTAL) BY MOUTH EVERY MONDAY, WEDNESDAY, AND FRIDAY. 135 tablet 1   No current facility-administered medications on file prior to visit.    Allergies  Allergen Reactions  . Amoxicillin Itching  . Dulaglutide     Other reaction(s): vomiting  . Miconazole Nitrate     Other reaction(s): rash  . Lantus [Insulin Glargine] Itching and  Rash    Family History  Problem Relation Age of Onset  . Diabetes Mother        type 2  . Hypertension Mother   . Thyroid disease Mother   . Bipolar disorder Mother   . Heart disease Mother   . Calcium disorder Mother   . Cancer Maternal Grandmother        Breast, stomach  . Cancer Paternal Grandmother        stomach, lung (smoker)  . Diabetes Paternal Grandmother   . Diabetes Paternal Grandfather   . Heart failure Sister   . Diabetes Sister   . Stroke Maternal Aunt   . Cancer Maternal Uncle        prostate  . CAD Maternal Aunt        stents  . Cancer Maternal Aunt 64       ovarian    Social History   Socioeconomic History  . Marital status: Legally Separated    Spouse name: Not on file  . Number of children: 2  . Years of education: Not on file  . Highest education level: Not on file  Occupational History  . Occupation: Freight forwarder  Tobacco Use  . Smoking status: Never Smoker  . Smokeless tobacco: Never Used  Vaping Use  . Vaping Use: Never used  Substance and Sexual Activity  . Alcohol use: Not Currently  . Drug use: No  . Sexual activity: Yes    Partners: Male    Birth control/protection: None  Other Topics Concern  . Not on file  Social History Narrative   Caffeine use: daily   Occupation: cleans houses   Regular exercise: yes, active 5x/wk   Diet: good water, fruits/vegetables daily   Social Determinants of Health   Financial Resource Strain: Not on file  Food Insecurity: Not on file  Transportation Needs: Not on file  Physical Activity: Not on file  Stress: Not on file  Social Connections: Not on file   Review of Systems - See HPI.  All other ROS are negative.  BP 124/70   Pulse 83   Temp 98.2 F (36.8 C) (Temporal)   Resp 16   Ht 6' (1.829 m)   Wt 173 lb (78.5 kg)   LMP 08/06/2020 Comment: negative urine pregnancy 1/3  SpO2 99%   BMI 23.46 kg/m   Physical Exam Vitals reviewed.  Constitutional:      Appearance: Normal appearance.   HENT:     Head: Normocephalic and atraumatic.  Cardiovascular:     Rate and Rhythm: Normal rate and regular rhythm.     Pulses: Normal pulses.     Heart sounds: Normal heart sounds.  Pulmonary:     Effort: Pulmonary effort is normal.     Breath sounds: Normal breath sounds.  Abdominal:     General: Bowel sounds are normal. There is no distension.     Palpations: Abdomen is soft. There is no mass.     Tenderness: There is abdominal  tenderness (mild bilateral upper abdominal tenderness). There is no right CVA tenderness, left CVA tenderness, guarding or rebound.  Musculoskeletal:     Cervical back: Neck supple.  Neurological:     General: No focal deficit present.     Mental Status: She is alert and oriented to person, place, and time.  Psychiatric:        Mood and Affect: Mood normal.     Recent Results (from the past 2160 hour(s))  Vitamin B12     Status: None   Collection Time: 06/08/20  7:47 AM  Result Value Ref Range   Vitamin B-12 359 180 - 914 pg/mL    Comment: (NOTE) This assay is not validated for testing neonatal or myeloproliferative syndrome specimens for Vitamin B12 levels. Performed at [facility], [place] [address] [place], [place] [address]   Sedimentation rate     Status: Abnormal   Collection Time: 06/08/20  7:47 AM  Result Value Ref Range   Sed Rate 25 (H) 0 - 22 mm/hr    Comment: Performed at [facility], [place] [address], [place], [place] [address]  Iron and TIBC     Status: Abnormal   Collection Time: 06/08/20  7:47 AM  Result Value Ref Range   Iron 67 41 - 142 ug/dL   TIBC 220 (L) 236 - 444 ug/dL   Saturation Ratios 30 21 - 57 %   UIBC 153 120 - 384 ug/dL    Comment: Performed at [facility], [place] [address] [place], [place] [address]  Ferritin     Status: None   Collection Time: 06/08/20  7:47 AM  Result Value Ref Range   Ferritin 245 11 - 307 ng/mL    Comment: Performed at [facility], [place] [address] [place], [place] [address]  CBC with Differential/Platelet     Status: Abnormal   Collection Time: 06/08/20  7:47 AM  Result Value Ref Range   WBC 4.6 4.0 - 10.5 K/uL   RBC 3.84 (L) 3.87 - 5.11 MIL/uL   Hemoglobin 11.1 (L) 12.0 - 15.0 g/dL   HCT 33.7 (L) 36.0 - 46.0 %   MCV 87.8 80.0 - 100.0 fL   MCH 28.9 26.0 - 34.0 pg   MCHC 32.9 30.0 - 36.0 g/dL   RDW 13.5 11.5 - 15.5 %   Platelets 212 150 - 400 K/uL   nRBC 0.0 0.0 - 0.2 %   Neutrophils Relative % 58 %   Neutro Abs 2.7 1.7 - 7.7 K/uL   Lymphocytes Relative 34 %   Lymphs Abs 1.6 0.7 - 4.0 K/uL   Monocytes Relative 6 %   Monocytes Absolute 0.3 0.1 - 1.0 K/uL   Eosinophils Relative 1 %   Eosinophils Absolute 0.1 0.0 - 0.5 K/uL   Basophils Relative 0 %   Basophils Absolute 0.0 0.0 - 0.1 K/uL   Immature Granulocytes 1 %   Abs Immature Granulocytes 0.03 0.00 - 0.07 K/uL    Comment: Performed at [facility], [place] [address] [place],  [address]  Reticulocytes     Status: Abnormal   Collection Time: 06/08/20  7:48 AM  Result Value Ref Range   Retic Ct Pct 1.1 0.4 - 3.1 %   RBC. 3.86 (L) 3.87 - 5.11 MIL/uL   Retic Count, Absolute 43.6 19.0 - 186.0 K/uL   Immature Retic Fract 11.8 2.3 - 15.9 %    Comment: Performed at  [facility], [place] [address], [place], [place] [address]  Basic metabolic panel     Status: Abnormal   Collection Time: 06/12/20 11:48 AM  Result Value Ref Range   Sodium 141 135 - 145 mmol/L   Potassium 4.1 3.5 - 5.1 mmol/L   Chloride 109 98 - 111 mmol/L   CO2 27 22 - 32 mmol/L   Glucose, Bld 93 70 - 99 mg/dL    Comment: Glucose reference range applies only to samples taken after fasting for at least 8 hours.   BUN 21 (H) 6 - 20 mg/dL   Creatinine, Ser 0.81 0.44 - 1.00 mg/dL   Calcium 8.7 (L) 8.9 - 10.3 mg/dL   GFR, Estimated >60 >60 mL/min    Comment: (NOTE) Calculated using the CKD-EPI Creatinine  Equation (2021)    Anion gap 5 5 - 15    Comment: Performed at [facility], [place] [address], [place], [place] [address]  CBC     Status: Abnormal   Collection Time: 06/12/20 11:48 AM  Result Value Ref Range   WBC 6.3 4.0 - 10.5 K/uL   RBC 3.70 (L) 3.87 - 5.11 MIL/uL   Hemoglobin 11.0 (L) 12.0 - 15.0 g/dL   HCT 33.1 (L) 36.0 - 46.0 %   MCV 89.5 80.0 - 100.0 fL   MCH 29.7 26.0 - 34.0 pg   MCHC 33.2 30.0 - 36.0 g/dL   RDW 13.5 11.5 - 15.5 %   Platelets 238 150 - 400 K/uL   nRBC 0.0 0.0 - 0.2 %    Comment: Performed at [facility], [place] [address] [place], [place] [address]  Troponin I (High Sensitivity)     Status: None   Collection Time: 06/12/20 11:48 AM  Result Value Ref Range   Troponin I (High Sensitivity) 5 <18 ng/L    Comment: (NOTE) Elevated high sensitivity troponin I (hsTnI) values and significant  changes across serial measurements may suggest ACS but many other  chronic and acute conditions are known to elevate hsTnI results.  Refer to the "Links" section for chest pain algorithms and additional  guidance. Performed at [facility], [place] [address], [place], [place] [address]   I-Stat beta hCG blood, ED     Status: None   Collection Time: 06/12/20 11:56 AM  Result Value Ref Range   I-stat hCG, quantitative <5.0 <5 mIU/mL   Comment 3            Comment:   GEST. AGE      CONC.  (mIU/mL)   <=1 WEEK        5 - 50     2 WEEKS       50 - 500     3 WEEKS       100 - 10,000     4 WEEKS     1,000 - 30,000        FEMALE AND NON-PREGNANT FEMALE:     LESS THAN 5 mIU/mL   Glucose, capillary     Status: Abnormal   Collection Time: 06/12/20  1:34 PM  Result Value Ref Range   Glucose-Capillary 66 (L) 70 - 99 mg/dL    Comment: Glucose reference range applies only to samples taken after fasting for at least 8 hours.  CBG monitoring, ED     Status: Abnormal   Collection Time: 06/12/20  2:27 PM  Result Value Ref Range    Glucose-Capillary 109 (H) 70 -  99 mg/dL    Comment: Glucose reference range applies only to samples taken after fasting for at least 8 hours.  CBG monitoring, ED     Status: Abnormal   Collection Time: 08/07/20  3:52 PM  Result Value Ref Range   Glucose-Capillary 304 (H) 70 - 99 mg/dL    Comment: Glucose reference range applies only to samples taken after fasting for at least 8 hours.  Lipase, blood     Status: None   Collection Time: 08/07/20  4:11 PM  Result Value Ref Range   Lipase 18 11 - 51 U/L    Comment: Performed at [facility], [place] [place], [place] [address]  Comprehensive metabolic panel     Status: Abnormal   Collection Time: 08/07/20  4:11 PM  Result Value Ref Range   Sodium 134 (L) 135 - 145 mmol/L   Potassium 3.9 3.5 - 5.1 mmol/L   Chloride 100 98 - 111 mmol/L   CO2 27 22 - 32 mmol/L   Glucose, Bld 349 (H) 70 - 99 mg/dL    Comment: Glucose reference range applies only to samples taken after fasting for at least 8 hours.   BUN 16 6 - 20 mg/dL   Creatinine, Ser 1.03 (H) 0.44 - 1.00 mg/dL   Calcium 8.4 (L) 8.9 - 10.3 mg/dL   Total Protein 6.4 (L) 6.5 - 8.1 g/dL   Albumin 2.6 (L) 3.5 - 5.0 g/dL   AST 22 15 - 41 U/L   ALT 18 0 - 44 U/L   Alkaline Phosphatase 71 38 - 126 U/L   Total Bilirubin 1.1 0.3 - 1.2 mg/dL   GFR, Estimated >60 >60 mL/min    Comment: (NOTE) Calculated using the CKD-EPI Creatinine Equation (2021)    Anion gap 7 5 - 15    Comment: Performed at [facility], [place] [place], [place] [address]  CBC     Status: Abnormal   Collection Time: 08/07/20  4:11 PM  Result Value Ref Range   WBC 5.2 4.0 - 10.5 K/uL   RBC 3.91 3.87 - 5.11 MIL/uL   Hemoglobin 11.7 (L) 12.0 - 15.0 g/dL   HCT 34.6 (L) 36.0 - 46.0 %   MCV 88.5 80.0 - 100.0 fL   MCH 29.9 26.0 - 34.0 pg   MCHC 33.8 30.0 - 36.0 g/dL   RDW 12.4 11.5 - 15.5 %   Platelets 274 150 - 400 K/uL   nRBC 0.0 0.0 - 0.2 %    Comment: Performed at [facility], [place] [place], [place] [address]  Urinalysis, Routine w reflex microscopic Urine, Clean Catch     Status: Abnormal   Collection Time: 08/07/20  4:11 PM  Result Value Ref Range   Color, Urine YELLOW YELLOW   APPearance HAZY (A) CLEAR   Specific Gravity, Urine 1.015 1.005 - 1.030   pH 7.0 5.0 - 8.0   Glucose, UA >=500 (A) NEGATIVE mg/dL   Hgb urine dipstick LARGE (A) NEGATIVE   Bilirubin Urine NEGATIVE NEGATIVE   Ketones, ur 40 (A) NEGATIVE mg/dL   Protein, ur >300 (A) NEGATIVE mg/dL   Nitrite NEGATIVE NEGATIVE   Leukocytes,Ua NEGATIVE NEGATIVE    Comment: Performed at [facility], [place] [place], [place] [address]  Pregnancy, urine     Status: None   Collection Time: 08/07/20  4:11 PM  Result Value Ref Range   Preg  Test, Ur NEGATIVE NEGATIVE    Comment:        THE SENSITIVITY OF THIS METHODOLOGY IS >20 mIU/mL. Performed at [facility], [place] [place], [place] [address]   Urinalysis, Microscopic (reflex)     Status: Abnormal   Collection Time: 08/07/20  4:11 PM  Result Value Ref Range   RBC / HPF >50 0 - 5 RBC/hpf   WBC, UA 0-5 0 - 5 WBC/hpf   Bacteria, UA FEW (A) NONE SEEN   Squamous Epithelial / LPF 0-5 0 - 5   Hyaline Casts, UA PRESENT     Comment: Performed at [facility], [place] [place], [place] [address]  CBG monitoring, ED     Status: Abnormal   Collection Time: 08/08/20  2:34 AM  Result Value Ref Range   Glucose-Capillary 119 (H) 70 - 99 mg/dL    Comment: Glucose reference range applies only to samples taken after fasting for at least 8 hours.   Comment 1 Notify RN   CBG monitoring, ED     Status: Abnormal   Collection Time: 08/08/20  5:16 AM  Result Value Ref Range   Glucose-Capillary 137 (H) 70 - 99 mg/dL    Comment: Glucose reference range applies only to samples taken after fasting for at least 8 hours.   Comment 1 Notify RN   POC SARS Coronavirus 2 Ag-ED - Nasal Swab (BD Veritor  Kit)     Status: None   Collection Time: 08/08/20 11:53 AM  Result Value Ref Range   SARS Coronavirus 2 Ag NEGATIVE NEGATIVE    Comment: (NOTE) SARS-CoV-2 antigen NOT DETECTED.   Negative results are presumptive.  Negative results do not preclude SARS-CoV-2 infection and should not be used as the sole basis for treatment or other patient management decisions, including infection  control decisions, particularly in the presence of clinical signs and  symptoms consistent with COVID-19, or in those who have been in contact with the virus.  Negative results must be combined with clinical observations, patient history, and epidemiological information. The expected result is Negative.  Fact Sheet for Patients: PodPark.tn  Fact Sheet for Healthcare Providers: GiftContent.is   This test is not yet approved or cleared by the [place] FDA and  has been authorized for detection and/or diagnosis of SARS-CoV-2 by FDA under an Emergency Use Authorization (EUA).  This EUA will remain in effect (meaning this test can be used) for the duration of  the C OVID-19 declaration under Section 564(b)(1) of the Act, 21 U.S.C. section 360bbb-3(b)(1), unless the authorization is terminated or revoked sooner.    Lipase, blood     Status: None   Collection Time: 08/08/20  3:35 PM  Result Value Ref Range   Lipase 16 11 - 51 U/L    Comment: Performed at [facility], [place] [address] [place], [place] [address]  Comprehensive metabolic panel     Status: Abnormal   Collection Time: 08/08/20  3:35 PM  Result Value Ref Range   Sodium 140 135 - 145 mmol/L   Potassium 3.9 3.5 - 5.1 mmol/L   Chloride 103 98 - 111 mmol/L   CO2 26 22 - 32 mmol/L   Glucose, Bld 320 (H) 70 - 99 mg/dL    Comment: Glucose reference range applies only to samples taken after fasting for at least 8 hours.   BUN 23 (H) 6 - 20 mg/dL   Creatinine, Ser 0.93  0.44 - 1.00 mg/dL   Calcium 8.7 (L) 8.9 - 10.3 mg/dL   Total Protein 6.1 (L) 6.5 - 8.1 g/dL   Albumin 2.5 (L) 3.5 - 5.0 g/dL   AST 17 15 - 41 U/L   ALT 19 0 - 44 U/L   Alkaline Phosphatase 65 38 - 126 U/L   Total Bilirubin 1.1 0.3 - 1.2 mg/dL   GFR, Estimated >60 >60 mL/min    Comment: (NOTE) Calculated using the CKD-EPI Creatinine Equation (2021)    Anion gap 11 5 - 15    Comment: Performed at [facility], [place] [address], [place], [place] [address]  CBC     Status: Abnormal   Collection Time: 08/08/20  3:35 PM  Result Value Ref Range   WBC 9.7 4.0 - 10.5 K/uL   RBC 3.93 3.87 - 5.11 MIL/uL   Hemoglobin 11.8 (L) 12.0 - 15.0 g/dL   HCT 34.8 (L) 36.0 - 46.0 %   MCV 88.5 80.0 - 100.0 fL   MCH 30.0 26.0 - 34.0 pg   MCHC 33.9 30.0 - 36.0 g/dL   RDW 12.4 11.5 - 15.5 %   Platelets 258 150 - 400 K/uL   nRBC 0.0 0.0 - 0.2 %    Comment: Performed at [facility], [place] [address], [place], [place] [address]  I-Stat beta hCG blood, ED     Status: None   Collection Time: 08/08/20  3:48 PM  Result Value Ref Range   I-stat hCG, quantitative <5.0 <5 mIU/mL   Comment 3            Comment:   GEST. AGE      CONC.  (mIU/mL)   <=1 WEEK        5 - 50     2 WEEKS       50 - 500     3 WEEKS       100 - 10,000     4 WEEKS     1,000 - 30,000        FEMALE AND NON-PREGNANT FEMALE:     LESS THAN 5 mIU/mL   POC CBG, ED     Status: Abnormal   Collection Time: 08/08/20  4:05 PM  Result Value Ref Range   Glucose-Capillary 220 (H) 70 - 99 mg/dL    Comment: Glucose reference range applies only to samples taken after fasting for at least 8 hours.  Urinalysis, Routine w reflex microscopic     Status: Abnormal   Collection Time: 08/08/20  4:10 PM  Result Value Ref Range   Color, Urine YELLOW YELLOW   APPearance HAZY (A) CLEAR   Specific Gravity, Urine 1.016 1.005 - 1.030   pH 6.0 5.0 - 8.0   Glucose, UA >=500 (A) NEGATIVE mg/dL   Hgb urine dipstick MODERATE (A)  NEGATIVE   Bilirubin Urine NEGATIVE NEGATIVE   Ketones, ur 20 (A) NEGATIVE mg/dL   Protein, ur >=300 (A) NEGATIVE mg/dL   Nitrite NEGATIVE NEGATIVE   Leukocytes,Ua NEGATIVE NEGATIVE   RBC / HPF 11-20 0 - 5 RBC/hpf   WBC, UA 0-5 0 - 5 WBC/hpf   Bacteria, UA NONE SEEN NONE SEEN   Squamous Epithelial / LPF 0-5 0 - 5   Mucus PRESENT    Hyaline Casts, UA PRESENT     Comment: Performed at [facility], [place] [address] [place], [place] [address]  POC CBG, ED     Status: Abnormal  Collection Time: 08/08/20  6:46 PM  Result Value Ref Range   Glucose-Capillary 272 (H) 70 - 99 mg/dL    Comment: Glucose reference range applies only to samples taken after fasting for at least 8 hours.  POC CBG, ED     Status: Abnormal   Collection Time: 08/08/20  7:46 PM  Result Value Ref Range   Glucose-Capillary 277 (H) 70 - 99 mg/dL    Comment: Glucose reference range applies only to samples taken after fasting for at least 8 hours.  Glucose, capillary     Status: Abnormal   Collection Time: 08/09/20  1:10 AM  Result Value Ref Range   Glucose-Capillary 310 (H) 70 - 99 mg/dL    Comment: Glucose reference range applies only to samples taken after fasting for at least 8 hours.  CBC     Status: Abnormal   Collection Time: 08/09/20  1:48 AM  Result Value Ref Range   WBC 6.6 4.0 - 10.5 K/uL   RBC 3.45 (L) 3.87 - 5.11 MIL/uL   Hemoglobin 10.3 (L) 12.0 - 15.0 g/dL   HCT 30.6 (L) 36.0 - 46.0 %   MCV 88.7 80.0 - 100.0 fL   MCH 29.9 26.0 - 34.0 pg   MCHC 33.7 30.0 - 36.0 g/dL   RDW 12.5 11.5 - 15.5 %   Platelets 250 150 - 400 K/uL   nRBC 0.0 0.0 - 0.2 %    Comment: Performed at [facility], [place] [address], [place], [place] [address]  Creatinine, serum     Status: Abnormal   Collection Time: 08/09/20  1:48 AM  Result Value Ref Range   Creatinine, Ser 1.25 (H) 0.44 - 1.00 mg/dL   GFR, Estimated 53 (L) >60 mL/min    Comment: (NOTE) Calculated using the CKD-EPI  Creatinine Equation (2021) Performed at [facility], [place] [address], [place], [place] [address]   Hemoglobin A1c     Status: Abnormal   Collection Time: 08/09/20  1:48 AM  Result Value Ref Range   Hgb A1c MFr Bld 7.5 (H) 4.8 - 5.6 %    Comment: (NOTE) Pre diabetes:          5.7%-6.4%  Diabetes:              >6.4%  Glycemic control for   <7.0% adults with diabetes    Mean Plasma Glucose 168.55 mg/dL    Comment: Performed at [place] [address], [place], [place] [address]  Lactic acid, plasma     Status: None   Collection Time: 08/09/20  1:48 AM  Result Value Ref Range   Lactic Acid, Venous 1.0 0.5 - 1.9 mmol/L    Comment: Performed at [facility], [place] [address] [place], [place] [address]  Troponin I (High Sensitivity)     Status: Abnormal   Collection Time: 08/09/20  1:48 AM  Result Value Ref Range   Troponin I (High Sensitivity) 19 (H) <18 ng/L    Comment: (NOTE) Elevated high sensitivity troponin I (hsTnI) values and significant  changes across serial measurements may suggest ACS but many other  chronic and acute conditions are known to elevate hsTnI results.  Refer to the "Links" section for chest pain algorithms and additional  guidance. Performed at [facility], [place] [address] [place], [place] [address]   Brain natriuretic peptide     Status: Abnormal   Collection Time: 08/09/20  1:48 AM  Result Value Ref  Range   B Natriuretic Peptide 588.9 (H) 0.0 - 100.0 pg/mL    Comment: Performed at [facility], [place] [address], [place], [place] [address]  D-dimer, quantitative (not at [facility])     Status: Abnormal   Collection Time: 08/09/20  1:48 AM  Result Value Ref Range   D-Dimer, Quant 2.55 (H) 0.00 - 0.50 ug/mL-FEU    Comment: (NOTE) At the manufacturer cut-off value of 0.5 g/mL FEU, this assay has a negative predictive value of 95-100%.This assay is intended for use in  conjunction with a clinical pretest probability (PTP) assessment model to exclude pulmonary embolism (PE) and deep venous thrombosis (DVT) in outpatients suspected of PE or DVT. Results should be correlated with clinical presentation. Performed at [facility], [place] [address], [place], [place] [address]   CBC     Status: Abnormal   Collection Time: 08/09/20  6:17 AM  Result Value Ref Range   WBC 6.3 4.0 - 10.5 K/uL   RBC 3.33 (L) 3.87 - 5.11 MIL/uL   Hemoglobin 9.8 (L) 12.0 - 15.0 g/dL   HCT 29.7 (L) 36.0 - 46.0 %   MCV 89.2 80.0 - 100.0 fL   MCH 29.4 26.0 - 34.0 pg   MCHC 33.0 30.0 - 36.0 g/dL   RDW 12.5 11.5 - 15.5 %   Platelets 237 150 - 400 K/uL   nRBC 0.0 0.0 - 0.2 %    Comment: Performed at [facility], [place] [address], [place], [place] [address]  Comprehensive metabolic panel     Status: Abnormal   Collection Time: 08/09/20  6:17 AM  Result Value Ref Range   Sodium 142 135 - 145 mmol/L   Potassium 3.9 3.5 - 5.1 mmol/L   Chloride 109 98 - 111 mmol/L   CO2 24 22 - 32 mmol/L   Glucose, Bld 292 (H) 70 - 99 mg/dL    Comment: Glucose reference range applies only to samples taken after fasting for at least 8 hours.   BUN 24 (H) 6 - 20 mg/dL   Creatinine, Ser 1.22 (H) 0.44 - 1.00 mg/dL   Calcium 7.7 (L) 8.9 - 10.3 mg/dL   Total Protein 4.6 (L) 6.5 - 8.1 g/dL   Albumin 2.0 (L) 3.5 - 5.0 g/dL   AST 14 (L) 15 - 41 U/L   ALT 15 0 - 44 U/L   Alkaline Phosphatase 50 38 - 126 U/L   Total Bilirubin 0.7 0.3 - 1.2 mg/dL   GFR, Estimated 54 (L) >60 mL/min    Comment: (NOTE) Calculated using the CKD-EPI Creatinine Equation (2021)    Anion gap 9 5 - 15    Comment: Performed at [facility], [place] [address], [place], [place] [address]  Lactic acid, plasma     Status: None   Collection Time: 08/09/20  6:17 AM  Result Value Ref Range   Lactic Acid, Venous 1.2 0.5 - 1.9 mmol/L    Comment: Performed at [facility], [place] [address] [place], [place] [address]  Troponin I (High Sensitivity)     Status: Abnormal   Collection Time: 08/09/20  6:17 AM  Result Value Ref Range   Troponin I (High Sensitivity) 20 (H) <18 ng/L    Comment: (NOTE) Elevated high sensitivity troponin I (hsTnI) values and significant  changes across serial measurements may suggest ACS but many other  chronic and acute conditions are known to elevate hsTnI results.  Refer to the "Links"  section for chest pain algorithms and additional  guidance. Performed at [facility], [place] [address], [place], [place] [address]   Glucose, capillary     Status: Abnormal   Collection Time: 08/09/20  7:58 AM  Result Value Ref Range   Glucose-Capillary 267 (H) 70 - 99 mg/dL    Comment: Glucose reference range applies only to samples taken after fasting for at least 8 hours.  Glucose, capillary     Status: Abnormal   Collection Time: 08/09/20 11:54 AM  Result Value Ref Range   Glucose-Capillary 193 (H) 70 - 99 mg/dL    Comment: Glucose reference range applies only to samples taken after fasting for at least 8 hours.  Glucose, capillary     Status: Abnormal   Collection Time: 08/09/20  4:50 PM  Result Value Ref Range   Glucose-Capillary 151 (H) 70 - 99 mg/dL    Comment: Glucose reference range applies only to samples taken after fasting for at least 8 hours.  Glucose, capillary     Status: Abnormal   Collection Time: 08/09/20  9:16 PM  Result Value Ref Range   Glucose-Capillary 147 (H) 70 - 99 mg/dL    Comment: Glucose reference range applies only to samples taken after fasting for at least 8 hours.  Glucose, capillary     Status: Abnormal   Collection Time: 08/10/20  4:14 AM  Result Value Ref Range   Glucose-Capillary 150 (H) 70 - 99 mg/dL    Comment: Glucose reference range applies only to samples taken after fasting for at least 8 hours.  Glucose, capillary     Status: Abnormal   Collection Time: 08/10/20   7:37 AM  Result Value Ref Range   Glucose-Capillary 163 (H) 70 - 99 mg/dL    Comment: Glucose reference range applies only to samples taken after fasting for at least 8 hours.  Basic metabolic panel     Status: Abnormal   Collection Time: 08/10/20  8:00 AM  Result Value Ref Range   Sodium 143 135 - 145 mmol/L   Potassium 3.6 3.5 - 5.1 mmol/L   Chloride 110 98 - 111 mmol/L   CO2 25 22 - 32 mmol/L   Glucose, Bld 195 (H) 70 - 99 mg/dL    Comment: Glucose reference range applies only to samples taken after fasting for at least 8 hours.   BUN 19 6 - 20 mg/dL   Creatinine, Ser 1.20 (H) 0.44 - 1.00 mg/dL   Calcium 8.3 (L) 8.9 - 10.3 mg/dL   GFR, Estimated 55 (L) >60 mL/min    Comment: (NOTE) Calculated using the CKD-EPI Creatinine Equation (2021)    Anion gap 8 5 - 15    Comment: Performed at [facility], [place] [address] [place], [place] [address]  Glucose, capillary     Status: Abnormal   Collection Time: 08/10/20 12:07 PM  Result Value Ref Range   Glucose-Capillary 168 (H) 70 - 99 mg/dL    Comment: Glucose reference range applies only to samples taken after fasting for at least 8 hours.  Glucose, capillary     Status: Abnormal   Collection Time: 08/10/20 12:59 PM  Result Value Ref Range   Glucose-Capillary 143 (H) 70 - 99 mg/dL    Comment: Glucose reference range applies only to samples taken after fasting for at least 8 hours.  Glucose, capillary     Status: Abnormal   Collection Time: 08/10/20  4:28 PM  Result Value Ref Range  Glucose-Capillary 172 (H) 70 - 99 mg/dL    Comment: Glucose reference range applies only to samples taken after fasting for at least 8 hours.  SARS CORONAVIRUS 2 (TAT 6-24 HRS) Nasopharyngeal Nasopharyngeal Swab     Status: None   Collection Time: 08/10/20  5:30 PM   Specimen: Nasopharyngeal Swab  Result Value Ref Range   SARS Coronavirus 2 NEGATIVE NEGATIVE    Comment: (NOTE) SARS-CoV-2 target nucleic acids are NOT DETECTED.  The  SARS-CoV-2 RNA is generally detectable in upper and lower respiratory specimens during the acute phase of infection. Negative results do not preclude SARS-CoV-2 infection, do not rule out co-infections with other pathogens, and should not be used as the sole basis for treatment or other patient management decisions. Negative results must be combined with clinical observations, patient history, and epidemiological information. The expected result is Negative.  Fact Sheet for Patients: SugarRoll.be  Fact Sheet for Healthcare Providers: https://www.woods-mathews.com/  This test is not yet approved or cleared by the [place] FDA and  has been authorized for detection and/or diagnosis of SARS-CoV-2 by FDA under an Emergency Use Authorization (EUA). This EUA will remain  in effect (meaning this test can be used) for the duration of the COVID-19 declaration under Se ction 564(b)(1) of the Act, 21 U.S.C. section 360bbb-3(b)(1), unless the authorization is terminated or revoked sooner.  Performed at [facility], [place] [address], [place], [place] [address]   Glucose, capillary     Status: None   Collection Time: 08/10/20  8:30 PM  Result Value Ref Range   Glucose-Capillary 93 70 - 99 mg/dL    Comment: Glucose reference range applies only to samples taken after fasting for at least 8 hours.  Glucose, capillary     Status: Abnormal   Collection Time: 08/10/20 11:43 PM  Result Value Ref Range   Glucose-Capillary 124 (H) 70 - 99 mg/dL    Comment: Glucose reference range applies only to samples taken after fasting for at least 8 hours.  Troponin I (High Sensitivity)     Status: None   Collection Time: 08/11/20  2:13 AM  Result Value Ref Range   Troponin I (High Sensitivity) 16 <18 ng/L    Comment: (NOTE) Elevated high sensitivity troponin I (hsTnI) values and significant  changes across serial measurements may suggest ACS but many other   chronic and acute conditions are known to elevate hsTnI results.  Refer to the "Links" section for chest pain algorithms and additional  guidance. Performed at [facility], [place] [address], [place], [place] [address]   Glucose, capillary     Status: None   Collection Time: 08/11/20  3:05 AM  Result Value Ref Range   Glucose-Capillary 99 70 - 99 mg/dL    Comment: Glucose reference range applies only to samples taken after fasting for at least 8 hours.   Comment 1 Notify RN   Glucose, capillary     Status: Abnormal   Collection Time: 08/11/20  7:26 AM  Result Value Ref Range   Glucose-Capillary 126 (H) 70 - 99 mg/dL    Comment: Glucose reference range applies only to samples taken after fasting for at least 8 hours.  Glucose, capillary     Status: Abnormal   Collection Time: 08/11/20 12:01 PM  Result Value Ref Range   Glucose-Capillary 149 (H) 70 - 99 mg/dL    Comment: Glucose reference range applies only to samples taken after fasting for at least 8 hours.  Surgical pathology     Status: None   Collection Time: 08/11/20  2:15 PM  Result Value Ref Range   SURGICAL PATHOLOGY      SURGICAL PATHOLOGY CASE: [id number] PATIENT: Maleaha Joe-PHILLIPS Surgical Pathology Report     Clinical History: Nausea and vomiting (kd)     FINAL MICROSCOPIC DIAGNOSIS:  A. STOMACH, BODY, POLYPECTOMY: - Fundic gland polyp.  B. STOMACH, BODY AND ANTRUM, BIOPSY: - Gastric antral mucosa with mild reactive gastropathy. - Gastric oxyntic mucosa with mild chronic gastritis. - Warthin-Starry stain is negative for Helicobacter pylori.  C. STOMACH, FUNDUS, BIOPSY: - Gastric oxyntic mucosa with mild chronic gastritis.  GROSS DESCRIPTION:  Specimen A: Received in formalin are tan, soft tissue fragments that are submitted in toto. Number: 3 Size: Average 0.25 cm Blocks: 1  Specimen B: Received in formalin are tan, soft tissue fragments that are submitted in  toto. Number: 4 Size: 0.2 to 0.5 cm Blocks: 1  Specimen C: Received in formalin are tan, soft tissue fragments that are submitted in toto. Number: 5 Size: 0.1 to 0.4 cm Blocks: 1  SW 08/11/2020     Final Diagnosis performed by Gillie Manners, MD.   Electronically signed 08/14/2020 Technical component performed at [facility], [place] [address], [place], [place] [address].  Professional component performed at [employer]. [facility], [place] [address], [place], [place] [address].  Immunohistochemistry Technical component (if applicable) was performed at [facility]. [address], [place], [place], [place] [address].   IMMUNOHISTOCHEMISTRY DISCLAIMER (if applicable): Some of these immunohistochemical stains may have been developed and the performance characteristics determine by [facility]. Some may not have been cleared or approved by the U.S. Food and Drug Administration. The FDA has determined that such clearance or approval is not necessary. This test is used for clinical purposes. It should not be regarded as investigational or for research. This laboratory is certified under the [place] (CLIA-88) as qualified to perform high complexity clinical laboratory testing.  The controls stained appropriately.   Glucose, capillary     Status: Abnormal   Collection Time: 08/11/20  5:02 PM  Result Value Ref Range   Glucose-Capillary 158 (H) 70 - 99 mg/dL    Comment: Glucose reference range applies only to samples taken after fasting for at least 8 hours.  Glucose, capillary     Status: Abnormal   Collection Time: 08/11/20  8:33 PM  Result Value Ref Range   Glucose-Capillary 198 (H) 70 - 99 mg/dL    Comment: Glucose reference range applies only to samples taken after fasting for at least 8 hours.  Glucose, capillary     Status: Abnormal   Collection Time: 08/11/20 11:47 PM  Result  Value Ref Range   Glucose-Capillary 183 (H) 70 - 99 mg/dL    Comment: Glucose reference range applies only to samples taken after fasting for at least 8 hours.  Glucose, capillary     Status: Abnormal   Collection Time: 08/12/20  4:02 AM  Result Value Ref Range   Glucose-Capillary 122 (H) 70 - 99 mg/dL    Comment: Glucose reference range applies only to samples taken after fasting for at least 8 hours.  Basic metabolic panel     Status: Abnormal   Collection Time: 08/12/20  6:48 AM  Result Value Ref Range   Sodium 140 135 - 145 mmol/L   Potassium 3.3 (L) 3.5 - 5.1 mmol/L   Chloride  109 98 - 111 mmol/L   CO2 26 22 - 32 mmol/L   Glucose, Bld 116 (H) 70 - 99 mg/dL    Comment: Glucose reference range applies only to samples taken after fasting for at least 8 hours.   BUN 12 6 - 20 mg/dL   Creatinine, Ser 1.08 (H) 0.44 - 1.00 mg/dL   Calcium 7.7 (L) 8.9 - 10.3 mg/dL   GFR, Estimated >60 >60 mL/min    Comment: (NOTE) Calculated using the CKD-EPI Creatinine Equation (2021)    Anion gap 5 5 - 15    Comment: Performed at [facility], [place] [address] [place], [place] [address]  CBC     Status: Abnormal   Collection Time: 08/12/20  6:48 AM  Result Value Ref Range   WBC 7.9 4.0 - 10.5 K/uL   RBC 3.38 (L) 3.87 - 5.11 MIL/uL   Hemoglobin 10.2 (L) 12.0 - 15.0 g/dL   HCT 30.6 (L) 36.0 - 46.0 %   MCV 90.5 80.0 - 100.0 fL   MCH 30.2 26.0 - 34.0 pg   MCHC 33.3 30.0 - 36.0 g/dL   RDW 12.1 11.5 - 15.5 %   Platelets 225 150 - 400 K/uL   nRBC 0.0 0.0 - 0.2 %    Comment: Performed at [facility], [place] [address], [place], [place] [address]  Procalcitonin - Baseline     Status: None   Collection Time: 08/12/20  6:48 AM  Result Value Ref Range   Procalcitonin <0.10 ng/mL    Comment:        Interpretation: PCT (Procalcitonin) <= 0.5 ng/mL: Systemic infection (sepsis) is not likely. Local bacterial infection is possible. (NOTE)       Sepsis PCT Algorithm            Lower Respiratory Tract                                      Infection PCT Algorithm    ----------------------------     ----------------------------         PCT < 0.25 ng/mL                PCT < 0.10 ng/mL          Strongly encourage             Strongly discourage   discontinuation of antibiotics    initiation of antibiotics    ----------------------------     -----------------------------       PCT 0.25 - 0.50 ng/mL            PCT 0.10 - 0.25 ng/mL               OR       >80% decrease in PCT            Discourage initiation of                                            antibiotics      Encourage discontinuation           of antibiotics    ----------------------------     -----------------------------         PCT >= 0.50 ng/mL  PCT 0.26 - 0.50 ng/mL               AND        <80% decrease in PCT             Encourage initiation of                                             antibiotics       Encourage continuation           of antibiotics    ----------------------------     -----------------------------        PCT >= 0.50 ng/mL                  PCT > 0.50 ng/mL               AND         increase in PCT                  Strongly encourage                                      initiation of antibiotics    Strongly encourage escalation           of antibiotics                                     -----------------------------                                           PCT <= 0.25 ng/mL                                                 OR                                        > 80% decrease in PCT                                      Discontinue / Do not initiate                                             antibiotics  Performed at [place] [address], [place], [place] [address]   Glucose, capillary     Status: Abnormal   Collection Time: 08/12/20  7:34 AM  Result Value Ref Range   Glucose-Capillary 100 (H) 70 - 99 mg/dL    Comment:  Glucose reference range applies only to samples taken after fasting for at least 8 hours.  Glucose, capillary     Status: Abnormal   Collection Time: 08/12/20 11:45 AM  Result Value Ref Range   Glucose-Capillary 103 (H) 70 - 99 mg/dL    Comment: Glucose reference range applies only to samples taken after fasting for at least 8 hours.  Glucose, capillary     Status: Abnormal   Collection Time: 08/12/20  5:22 PM  Result Value Ref Range   Glucose-Capillary 166 (H) 70 - 99 mg/dL    Comment: Glucose reference range applies only to samples taken after fasting for at least 8 hours.  Glucose, capillary     Status: Abnormal   Collection Time: 08/12/20  8:10 PM  Result Value Ref Range   Glucose-Capillary 129 (H) 70 - 99 mg/dL    Comment: Glucose reference range applies only to samples taken after fasting for at least 8 hours.  Glucose, capillary     Status: Abnormal   Collection Time: 08/12/20 11:21 PM  Result Value Ref Range   Glucose-Capillary 59 (L) 70 - 99 mg/dL    Comment: Glucose reference range applies only to samples taken after fasting for at least 8 hours.  Glucose, capillary     Status: None   Collection Time: 08/12/20 11:41 PM  Result Value Ref Range   Glucose-Capillary 76 70 - 99 mg/dL    Comment: Glucose reference range applies only to samples taken after fasting for at least 8 hours.  Glucose, capillary     Status: Abnormal   Collection Time: 08/13/20  4:16 AM  Result Value Ref Range   Glucose-Capillary 114 (H) 70 - 99 mg/dL    Comment: Glucose reference range applies only to samples taken after fasting for at least 8 hours.  Glucose, capillary     Status: Abnormal   Collection Time: 08/13/20  7:55 AM  Result Value Ref Range   Glucose-Capillary 119 (H) 70 - 99 mg/dL    Comment: Glucose reference range applies only to samples taken after fasting for at least 8 hours.  Glucose, capillary     Status: Abnormal   Collection Time: 08/13/20 11:53 AM  Result Value Ref Range    Glucose-Capillary 172 (H) 70 - 99 mg/dL    Comment: Glucose reference range applies only to samples taken after fasting for at least 8 hours.  Glucose, capillary     Status: Abnormal   Collection Time: 08/13/20  5:33 PM  Result Value Ref Range   Glucose-Capillary 207 (H) 70 - 99 mg/dL    Comment: Glucose reference range applies only to samples taken after fasting for at least 8 hours.  Glucose, capillary     Status: Abnormal   Collection Time: 08/13/20  8:16 PM  Result Value Ref Range   Glucose-Capillary 170 (H) 70 - 99 mg/dL    Comment: Glucose reference range applies only to samples taken after fasting for at least 8 hours.  Glucose, capillary     Status: None   Collection Time: 08/13/20 11:47 PM  Result Value Ref Range   Glucose-Capillary 94 70 - 99 mg/dL    Comment: Glucose reference range applies only to samples taken after fasting for at least 8 hours.  Glucose, capillary     Status: None   Collection Time: 08/14/20  4:21 AM  Result Value Ref Range   Glucose-Capillary 86 70 - 99 mg/dL    Comment: Glucose reference range applies only to samples taken after fasting for at least 8 hours.  Basic metabolic panel     Status: Abnormal   Collection Time: 08/14/20  7:28 AM  Result Value Ref Range  Sodium 138 135 - 145 mmol/L   Potassium 3.5 3.5 - 5.1 mmol/L   Chloride 106 98 - 111 mmol/L   CO2 24 22 - 32 mmol/L   Glucose, Bld 118 (H) 70 - 99 mg/dL    Comment: Glucose reference range applies only to samples taken after fasting for at least 8 hours.   BUN 9 6 - 20 mg/dL   Creatinine, Ser 1.18 (H) 0.44 - 1.00 mg/dL   Calcium 7.2 (L) 8.9 - 10.3 mg/dL   GFR, Estimated 56 (L) >60 mL/min    Comment: (NOTE) Calculated using the CKD-EPI Creatinine Equation (2021)    Anion gap 8 5 - 15    Comment: Performed at [facility], [place] [address] [place], [place] [address]  Glucose, capillary     Status: Abnormal   Collection Time: 08/14/20  8:31 AM  Result Value Ref Range    Glucose-Capillary 126 (H) 70 - 99 mg/dL    Comment: Glucose reference range applies only to samples taken after fasting for at least 8 hours.  Glucose, capillary     Status: Abnormal   Collection Time: 08/14/20  1:15 PM  Result Value Ref Range   Glucose-Capillary 134 (H) 70 - 99 mg/dL    Comment: Glucose reference range applies only to samples taken after fasting for at least 8 hours.  Glucose, capillary     Status: Abnormal   Collection Time: 08/14/20  4:51 PM  Result Value Ref Range   Glucose-Capillary 172 (H) 70 - 99 mg/dL    Comment: Glucose reference range applies only to samples taken after fasting for at least 8 hours.  Glucose, capillary     Status: Abnormal   Collection Time: 08/14/20  9:58 PM  Result Value Ref Range   Glucose-Capillary 139 (H) 70 - 99 mg/dL    Comment: Glucose reference range applies only to samples taken after fasting for at least 8 hours.  Glucose, capillary     Status: Abnormal   Collection Time: 08/15/20  7:49 AM  Result Value Ref Range   Glucose-Capillary 126 (H) 70 - 99 mg/dL    Comment: Glucose reference range applies only to samples taken after fasting for at least 8 hours.   Comment 1 Notify RN    Comment 2 Document in Chart   Glucose, capillary     Status: Abnormal   Collection Time: 08/15/20 12:07 PM  Result Value Ref Range   Glucose-Capillary 142 (H) 70 - 99 mg/dL    Comment: Glucose reference range applies only to samples taken after fasting for at least 8 hours.   Comment 1 Notify RN    Comment 2 Document in Chart   CBC     Status: Abnormal   Collection Time: 08/15/20 12:08 PM  Result Value Ref Range   WBC 10.6 (H) 4.0 - 10.5 K/uL   RBC 3.26 (L) 3.87 - 5.11 MIL/uL   Hemoglobin 9.7 (L) 12.0 - 15.0 g/dL   HCT 30.1 (L) 36.0 - 46.0 %   MCV 92.3 80.0 - 100.0 fL   MCH 29.8 26.0 - 34.0 pg   MCHC 32.2 30.0 - 36.0 g/dL   RDW 12.3 11.5 - 15.5 %   Platelets PLATELET CLUMPS NOTED ON SMEAR, UNABLE TO ESTIMATE 150 - 400 K/uL   nRBC 0.0 0.0 -  0.2 %    Comment: Performed at [facility], [place] [address] [place] [address], [place], [place] [address]  Basic metabolic panel     Status:  Abnormal   Collection Time: 08/15/20 12:08 PM  Result Value Ref Range   Sodium 136 135 - 145 mmol/L   Potassium 3.5 3.5 - 5.1 mmol/L   Chloride 101 98 - 111 mmol/L   CO2 27 22 - 32 mmol/L   Glucose, Bld 157 (H) 70 - 99 mg/dL    Comment: Glucose reference range applies only to samples taken after fasting for at least 8 hours.   BUN 9 6 - 20 mg/dL   Creatinine, Ser 1.38 (H) 0.44 - 1.00 mg/dL   Calcium 7.3 (L) 8.9 - 10.3 mg/dL   GFR, Estimated 47 (L) >60 mL/min    Comment: (NOTE) Calculated using the CKD-EPI Creatinine Equation (2021)    Anion gap 8 5 - 15    Comment: Performed at [facility], [place] [address] [place], [place] [address]  Procalcitonin - Baseline     Status: None   Collection Time: 08/15/20 12:10 PM  Result Value Ref Range   Procalcitonin 0.14 ng/mL    Comment:        Interpretation: PCT (Procalcitonin) <= 0.5 ng/mL: Systemic infection (sepsis) is not likely. Local bacterial infection is possible. (NOTE)       Sepsis PCT Algorithm           Lower Respiratory Tract                                      Infection PCT Algorithm    ----------------------------     ----------------------------         PCT < 0.25 ng/mL                PCT < 0.10 ng/mL          Strongly encourage             Strongly discourage   discontinuation of antibiotics    initiation of antibiotics    ----------------------------     -----------------------------       PCT 0.25 - 0.50 ng/mL            PCT 0.10 - 0.25 ng/mL               OR       >80% decrease in PCT            Discourage initiation of                                            antibiotics      Encourage discontinuation           of antibiotics    ----------------------------     -----------------------------         PCT >= 0.50 ng/mL              PCT 0.26 - 0.50 ng/mL                AND        <80% decrease in PCT             Encourage initiation of  antibiotics       Encourage continuation           of antibiotics    ----------------------------     -----------------------------        PCT >= 0.50 ng/mL                  PCT > 0.50 ng/mL               AND         increase in PCT                  Strongly encourage                                      initiation of antibiotics    Strongly encourage escalation           of antibiotics                                     -----------------------------                                           PCT <= 0.25 ng/mL                                                 OR                                        > 80% decrease in PCT                                      Discontinue / Do not initiate                                             antibiotics  Performed at [place] [address], [place], [place] [address]   Glucose, capillary     Status: Abnormal   Collection Time: 08/15/20  6:46 PM  Result Value Ref Range   Glucose-Capillary 242 (H) 70 - 99 mg/dL    Comment: Glucose reference range applies only to samples taken after fasting for at least 8 hours.  Glucose, capillary     Status: Abnormal   Collection Time: 08/15/20  9:25 PM  Result Value Ref Range   Glucose-Capillary 205 (H) 70 - 99 mg/dL    Comment: Glucose reference range applies only to samples taken after fasting for at least 8 hours.  Procalcitonin     Status: None   Collection Time: 08/16/20  6:02 AM  Result Value Ref Range   Procalcitonin 0.11 ng/mL    Comment:        Interpretation: PCT (Procalcitonin) <= 0.5 ng/mL: Systemic infection (sepsis) is not likely. Local bacterial infection is possible. (NOTE)  Sepsis PCT Algorithm           Lower Respiratory Tract                                      Infection PCT Algorithm    ----------------------------      ----------------------------         PCT < 0.25 ng/mL                PCT < 0.10 ng/mL          Strongly encourage             Strongly discourage   discontinuation of antibiotics    initiation of antibiotics    ----------------------------     -----------------------------       PCT 0.25 - 0.50 ng/mL            PCT 0.10 - 0.25 ng/mL               OR       >80% decrease in PCT            Discourage initiation of                                            antibiotics      Encourage discontinuation           of antibiotics    ----------------------------     -----------------------------         PCT >= 0.50 ng/mL              PCT 0.26 - 0.50 ng/mL               AND        <80% decrease in PCT             Encourage initiation of                                             antibiotics       Encourage continuation           of antibiotics    ----------------------------     -----------------------------        PCT >= 0.50 ng/mL                  PCT > 0.50 ng/mL               AND         increase in PCT                  Strongly encourage                                      initiation of antibiotics    Strongly encourage escalation           of antibiotics                                     -----------------------------  PCT <= 0.25 ng/mL                                                 OR                                        > 80% decrease in PCT                                      Discontinue / Do not initiate                                             antibiotics  Performed at [facility] [address], [place], [place] [address]   Basic metabolic panel     Status: Abnormal   Collection Time: 08/16/20  6:02 AM  Result Value Ref Range   Sodium 137 135 - 145 mmol/L   Potassium 3.4 (L) 3.5 - 5.1 mmol/L   Chloride 103 98 - 111 mmol/L   CO2 26 22 - 32 mmol/L   Glucose, Bld 247 (H) 70 - 99 mg/dL    Comment: Glucose  reference range applies only to samples taken after fasting for at least 8 hours.   BUN 14 6 - 20 mg/dL   Creatinine, Ser 1.38 (H) 0.44 - 1.00 mg/dL   Calcium 6.8 (L) 8.9 - 10.3 mg/dL   GFR, Estimated 47 (L) >60 mL/min    Comment: (NOTE) Calculated using the CKD-EPI Creatinine Equation (2021)    Anion gap 8 5 - 15    Comment: Performed at [facility], [place] [address] [place], [place] [address]  CBC     Status: Abnormal   Collection Time: 08/16/20  6:02 AM  Result Value Ref Range   WBC 7.3 4.0 - 10.5 K/uL   RBC 2.69 (L) 3.87 - 5.11 MIL/uL   Hemoglobin 8.1 (L) 12.0 - 15.0 g/dL   HCT 24.9 (L) 36.0 - 46.0 %   MCV 92.6 80.0 - 100.0 fL   MCH 30.1 26.0 - 34.0 pg   MCHC 32.5 30.0 - 36.0 g/dL   RDW 12.3 11.5 - 15.5 %   Platelets 162 150 - 400 K/uL   nRBC 0.0 0.0 - 0.2 %    Comment: Performed at [facility], [place] [address] [place], [place] [address]  Glucose, capillary     Status: Abnormal   Collection Time: 08/16/20  8:01 AM  Result Value Ref Range   Glucose-Capillary 233 (H) 70 - 99 mg/dL    Comment: Glucose reference range applies only to samples taken after fasting for at least 8 hours.   Comment 1 Notify RN    Comment 2 Document in Chart   Glucose, capillary     Status: Abnormal   Collection Time: 08/16/20 12:20 PM  Result Value Ref Range   Glucose-Capillary 222 (H) 70 - 99 mg/dL    Comment: Glucose reference range applies only to samples taken after fasting for at least 8 hours.  Lipase, blood  Status: None   Collection Time: 08/23/20  5:02 PM  Result Value Ref Range   Lipase 17 11 - 51 U/L    Comment: Performed at [facility], [place] [address] [place], [place] [address]  Comprehensive metabolic panel     Status: Abnormal   Collection Time: 08/23/20  5:02 PM  Result Value Ref Range   Sodium 142 135 - 145 mmol/L   Potassium 3.8 3.5 - 5.1 mmol/L   Chloride 106 98 - 111 mmol/L   CO2 27 22 - 32 mmol/L   Glucose,  Bld 229 (H) 70 - 99 mg/dL    Comment: Glucose reference range applies only to samples taken after fasting for at least 8 hours.   BUN 11 6 - 20 mg/dL   Creatinine, Ser 1.00 0.44 - 1.00 mg/dL   Calcium 8.7 (L) 8.9 - 10.3 mg/dL   Total Protein 5.8 (L) 6.5 - 8.1 g/dL   Albumin 2.2 (L) 3.5 - 5.0 g/dL   AST 9 (L) 15 - 41 U/L   ALT 11 0 - 44 U/L   Alkaline Phosphatase 76 38 - 126 U/L   Total Bilirubin 0.7 0.3 - 1.2 mg/dL   GFR, Estimated >60 >60 mL/min    Comment: (NOTE) Calculated using the CKD-EPI Creatinine Equation (2021)    Anion gap 9 5 - 15    Comment: Performed at [facility], [place] [address], [place], [place] [address]  CBC     Status: Abnormal   Collection Time: 08/23/20  5:02 PM  Result Value Ref Range   WBC 5.4 4.0 - 10.5 K/uL   RBC 3.04 (L) 3.87 - 5.11 MIL/uL   Hemoglobin 9.0 (L) 12.0 - 15.0 g/dL   HCT 28.1 (L) 36.0 - 46.0 %   MCV 92.4 80.0 - 100.0 fL   MCH 29.6 26.0 - 34.0 pg   MCHC 32.0 30.0 - 36.0 g/dL   RDW 12.7 11.5 - 15.5 %   Platelets 365 150 - 400 K/uL   nRBC 0.0 0.0 - 0.2 %    Comment: Performed at [facility], [place] [address], [place], [place] [address]  Brain natriuretic peptide     Status: Abnormal   Collection Time: 08/23/20  5:02 PM  Result Value Ref Range   B Natriuretic Peptide 350.6 (H) 0.0 - 100.0 pg/mL    Comment: Performed at [facility], [place] [address] [place], [place] [address]  POC CBG, ED     Status: Abnormal   Collection Time: 08/24/20  3:35 AM  Result Value Ref Range   Glucose-Capillary 111 (H) 70 - 99 mg/dL    Comment: Glucose reference range applies only to samples taken after fasting for at least 8 hours.  Urinalysis, Routine w reflex microscopic Urine, Clean Catch     Status: Abnormal   Collection Time: 08/24/20  4:56 AM  Result Value Ref Range   Color, Urine YELLOW YELLOW   APPearance HAZY (A) CLEAR   Specific Gravity, Urine 1.017 1.005 - 1.030   pH 6.0 5.0 - 8.0   Glucose,  UA NEGATIVE NEGATIVE mg/dL   Hgb urine dipstick SMALL (A) NEGATIVE   Bilirubin Urine NEGATIVE NEGATIVE   Ketones, ur NEGATIVE NEGATIVE mg/dL   Protein, ur >=300 (A) NEGATIVE mg/dL   Nitrite NEGATIVE NEGATIVE   Leukocytes,Ua TRACE (A) NEGATIVE   RBC / HPF 0-5 0 - 5 RBC/hpf   WBC, UA 11-20 0 - 5 WBC/hpf   Bacteria, UA  MANY (A) NONE SEEN   Squamous Epithelial / LPF 0-5 0 - 5   Mucus PRESENT    Hyaline Casts, UA PRESENT     Comment: Performed at [facility], [place] [address], [place], [place] [address]  SARS CORONAVIRUS 2 (TAT 6-24 HRS) Nasopharyngeal Nasopharyngeal Swab     Status: None   Collection Time: 08/24/20  5:23 AM   Specimen: Nasopharyngeal Swab  Result Value Ref Range   SARS Coronavirus 2 NEGATIVE NEGATIVE    Comment: (NOTE) SARS-CoV-2 target nucleic acids are NOT DETECTED.  The SARS-CoV-2 RNA is generally detectable in upper and lower respiratory specimens during the acute phase of infection. Negative results do not preclude SARS-CoV-2 infection, do not rule out co-infections with other pathogens, and should not be used as the sole basis for treatment or other patient management decisions. Negative results must be combined with clinical observations, patient history, and epidemiological information. The expected result is Negative.  Fact Sheet for Patients: SugarRoll.be  Fact Sheet for Healthcare Providers: https://www.woods-mathews.com/  This test is not yet approved or cleared by the [place] FDA and  has been authorized for detection and/or diagnosis of SARS-CoV-2 by FDA under an Emergency Use Authorization (EUA). This EUA will remain  in effect (meaning this test can be used) for the duration of the COVID-19 declaration under Se ction 564(b)(1) of the Act, 21 U.S.C. section 360bbb-3(b)(1), unless the authorization is terminated or revoked sooner.  Performed at [facility], [place] [address], [place], [place] [address]   CBG monitoring, ED     Status: Abnormal   Collection Time: 08/24/20  6:26 AM  Result Value Ref Range   Glucose-Capillary 115 (H) 70 - 99 mg/dL    Comment: Glucose reference range applies only to samples taken after fasting for at least 8 hours.  CBG monitoring, ED     Status: Abnormal   Collection Time: 08/24/20 10:08 AM  Result Value Ref Range   Glucose-Capillary 119 (H) 70 - 99 mg/dL    Comment: Glucose reference range applies only to samples taken after fasting for at least 8 hours.  Urine culture     Status: Abnormal   Collection Time: 08/24/20 11:41 AM   Specimen: Urine, Clean Catch  Result Value Ref Range   Specimen Description      URINE, CLEAN CATCH Performed at [facility], [place] [address] [place], [place] [address]    Special Requests      NONE Performed at [facility], [place] [address] [place], [place] [address]    Culture >=100,000 COLONIES/mL ESCHERICHIA COLI (A)    Report Status 08/26/2020 FINAL    Organism ID, Bacteria ESCHERICHIA COLI (A)       Susceptibility   Escherichia coli - MIC*    AMPICILLIN <=2 SENSITIVE Sensitive     CEFAZOLIN <=4 SENSITIVE Sensitive     CEFEPIME <=0.12 SENSITIVE Sensitive     CEFTRIAXONE <=0.25 SENSITIVE Sensitive     CIPROFLOXACIN <=0.25 SENSITIVE Sensitive     GENTAMICIN <=1 SENSITIVE Sensitive     IMIPENEM <=0.25 SENSITIVE Sensitive     NITROFURANTOIN <=16 SENSITIVE Sensitive     TRIMETH/SULFA <=20 SENSITIVE Sensitive     AMPICILLIN/SULBACTAM <=2 SENSITIVE Sensitive     PIP/TAZO <=4 SENSITIVE Sensitive     * >=100,000 COLONIES/mL ESCHERICHIA COLI  Comprehensive metabolic panel     Status: Abnormal   Collection Time: 08/24/20 11:41 AM  Result Value Ref Range  Sodium 143 135 - 145 mmol/L   Potassium 3.4 (L) 3.5 - 5.1 mmol/L   Chloride 111 98 - 111 mmol/L   CO2 24 22 - 32 mmol/L   Glucose, Bld 113 (H) 70 - 99 mg/dL    Comment: Glucose reference range applies  only to samples taken after fasting for at least 8 hours.   BUN 7 6 - 20 mg/dL   Creatinine, Ser 0.74 0.44 - 1.00 mg/dL   Calcium 7.3 (L) 8.9 - 10.3 mg/dL   Total Protein 4.9 (L) 6.5 - 8.1 g/dL   Albumin 1.8 (L) 3.5 - 5.0 g/dL   AST 8 (L) 15 - 41 U/L   ALT 8 0 - 44 U/L   Alkaline Phosphatase 61 38 - 126 U/L   Total Bilirubin 0.3 0.3 - 1.2 mg/dL   GFR, Estimated >60 >60 mL/min    Comment: (NOTE) Calculated using the CKD-EPI Creatinine Equation (2021)    Anion gap 8 5 - 15    Comment: Performed at [facility], [place] [address] [place], [place] [address]  CBC with Differential/Platelet     Status: Abnormal   Collection Time: 08/24/20 11:41 AM  Result Value Ref Range   WBC 4.5 4.0 - 10.5 K/uL   RBC 2.79 (L) 3.87 - 5.11 MIL/uL   Hemoglobin 8.3 (L) 12.0 - 15.0 g/dL   HCT 25.8 (L) 36.0 - 46.0 %   MCV 92.5 80.0 - 100.0 fL   MCH 29.7 26.0 - 34.0 pg   MCHC 32.2 30.0 - 36.0 g/dL   RDW 12.6 11.5 - 15.5 %   Platelets 348 150 - 400 K/uL   nRBC 0.0 0.0 - 0.2 %   Neutrophils Relative % 62 %   Neutro Abs 2.8 1.7 - 7.7 K/uL   Lymphocytes Relative 30 %   Lymphs Abs 1.4 0.7 - 4.0 K/uL   Monocytes Relative 6 %   Monocytes Absolute 0.3 0.1 - 1.0 K/uL   Eosinophils Relative 1 %   Eosinophils Absolute 0.0 0.0 - 0.5 K/uL   Basophils Relative 1 %   Basophils Absolute 0.0 0.0 - 0.1 K/uL   Immature Granulocytes 0 %   Abs Immature Granulocytes 0.02 0.00 - 0.07 K/uL    Comment: Performed at [facility], [place] [address], [place], [place] [address]  Pregnancy, urine     Status: None   Collection Time: 08/24/20 11:46 AM  Result Value Ref Range   Preg Test, Ur NEGATIVE NEGATIVE    Comment:        THE SENSITIVITY OF THIS METHODOLOGY IS >20 mIU/mL. Performed at [facility], [place] [address] [place], [place] [address]   CBG monitoring, ED     Status: Abnormal   Collection Time: 08/24/20  4:34 PM  Result Value Ref Range   Glucose-Capillary 55  (L) 70 - 99 mg/dL    Comment: Glucose reference range applies only to samples taken after fasting for at least 8 hours.   Comment 1 Notify RN    Comment 2 Document in Chart   CBG monitoring, ED     Status: Abnormal   Collection Time: 08/24/20  5:35 PM  Result Value Ref Range   Glucose-Capillary 103 (H) 70 - 99 mg/dL    Comment: Glucose reference range applies only to samples taken after fasting for at least 8 hours.  Heparin level (unfractionated)     Status: Abnormal   Collection Time: 08/24/20  6:50 PM  Result Value  Ref Range   Heparin Unfractionated 0.10 (L) 0.30 - 0.70 IU/mL    Comment: (NOTE) If heparin results are below expected values, and patient dosage has  been confirmed, suggest follow up testing of antithrombin III levels. Performed at [facility], [place] [address], [place], [place] [address]   Glucose, capillary     Status: Abnormal   Collection Time: 08/24/20  8:54 PM  Result Value Ref Range   Glucose-Capillary 136 (H) 70 - 99 mg/dL    Comment: Glucose reference range applies only to samples taken after fasting for at least 8 hours.  Glucose, capillary     Status: None   Collection Time: 08/25/20 12:03 AM  Result Value Ref Range   Glucose-Capillary 90 70 - 99 mg/dL    Comment: Glucose reference range applies only to samples taken after fasting for at least 8 hours.  Comprehensive metabolic panel     Status: Abnormal   Collection Time: 08/25/20  1:41 AM  Result Value Ref Range   Sodium 138 135 - 145 mmol/L   Potassium 3.8 3.5 - 5.1 mmol/L   Chloride 107 98 - 111 mmol/L   CO2 24 22 - 32 mmol/L   Glucose, Bld 122 (H) 70 - 99 mg/dL    Comment: Glucose reference range applies only to samples taken after fasting for at least 8 hours.   BUN 7 6 - 20 mg/dL   Creatinine, Ser 0.97 0.44 - 1.00 mg/dL   Calcium 8.0 (L) 8.9 - 10.3 mg/dL   Total Protein 5.3 (L) 6.5 - 8.1 g/dL   Albumin 1.9 (L) 3.5 - 5.0 g/dL   AST 15 15 - 41 U/L   ALT 9 0 - 44 U/L    Alkaline Phosphatase 66 38 - 126 U/L   Total Bilirubin 0.5 0.3 - 1.2 mg/dL   GFR, Estimated >60 >60 mL/min    Comment: (NOTE) Calculated using the CKD-EPI Creatinine Equation (2021)    Anion gap 7 5 - 15    Comment: Performed at [facility], [place] [address] [place], [place] [address]  Heparin level (unfractionated)     Status: None   Collection Time: 08/25/20  1:41 AM  Result Value Ref Range   Heparin Unfractionated 0.33 0.30 - 0.70 IU/mL    Comment: (NOTE) If heparin results are below expected values, and patient dosage has  been confirmed, suggest follow up testing of antithrombin III levels. Performed at [facility], [place] [address], [place], [place] [address]   Glucose, capillary     Status: Abnormal   Collection Time: 08/25/20  4:19 AM  Result Value Ref Range   Glucose-Capillary 107 (H) 70 - 99 mg/dL    Comment: Glucose reference range applies only to samples taken after fasting for at least 8 hours.  Glucose, capillary     Status: Abnormal   Collection Time: 08/25/20  8:01 AM  Result Value Ref Range   Glucose-Capillary 53 (L) 70 - 99 mg/dL    Comment: Glucose reference range applies only to samples taken after fasting for at least 8 hours.  Heparin level (unfractionated)     Status: None   Collection Time: 08/25/20  8:17 AM  Result Value Ref Range   Heparin Unfractionated 0.47 0.30 - 0.70 IU/mL    Comment: (NOTE) If heparin results are below expected values, and patient dosage has  been confirmed, suggest follow up testing of antithrombin III levels. Performed at [facility], [place]  [address], [place], [place] [address]   CBC     Status: Abnormal   Collection Time: 08/25/20  8:17 AM  Result Value Ref Range   WBC 4.5 4.0 - 10.5 K/uL   RBC 2.69 (L) 3.87 - 5.11 MIL/uL   Hemoglobin 7.8 (L) 12.0 - 15.0 g/dL   HCT 24.9 (L) 36.0 - 46.0 %   MCV 92.6 80.0 - 100.0 fL   MCH 29.0 26.0 - 34.0 pg   MCHC 31.3 30.0 - 36.0  g/dL   RDW 12.8 11.5 - 15.5 %   Platelets 332 150 - 400 K/uL   nRBC 0.0 0.0 - 0.2 %    Comment: Performed at [facility], [place] [address], [place],  [address]  Glucose, capillary     Status: None   Collection Time: 08/25/20  8:30 AM  Result Value Ref Range   Glucose-Capillary 80 70 - 99 mg/dL    Comment: Glucose reference range applies only to samples taken after fasting for at least 8 hours.  ECHOCARDIOGRAM COMPLETE     Status: None   Collection Time: 08/25/20 11:28 AM  Result Value Ref Range   Weight 2,884.8 oz   Height 72 in   BP 145/96 mmHg   S' Lateral 4.10 cm   Area-P 1/2 4.89 cm2  Glucose, capillary     Status: None   Collection Time: 08/25/20 11:54 AM  Result Value Ref Range   Glucose-Capillary 88 70 - 99 mg/dL    Comment: Glucose reference range applies only to samples taken after fasting for at least 8 hours.  Glucose, capillary     Status: None   Collection Time: 08/25/20  4:46 PM  Result Value Ref Range   Glucose-Capillary 82 70 - 99 mg/dL    Comment: Glucose reference range applies only to samples taken after fasting for at least 8 hours.  Glucose, capillary     Status: Abnormal   Collection Time: 08/25/20  7:41 PM  Result Value Ref Range   Glucose-Capillary 114 (H) 70 - 99 mg/dL    Comment: Glucose reference range applies only to samples taken after fasting for at least 8 hours.  Glucose, capillary     Status: Abnormal   Collection Time: 08/25/20  9:59 PM  Result Value Ref Range   Glucose-Capillary 168 (H) 70 - 99 mg/dL    Comment: Glucose reference range applies only to samples taken after fasting for at least 8 hours.  Glucose, capillary     Status: Abnormal   Collection Time: 08/26/20 12:44 AM  Result Value Ref Range   Glucose-Capillary 169 (H) 70 - 99 mg/dL    Comment: Glucose reference range applies only to samples taken after fasting for at least 8 hours.  Epstein-Barr virus VCA, IgM     Status: None   Collection Time: 08/26/20  12:47 AM  Result Value Ref Range   EBV VCA IgM <36.0 0.0 - 35.9 U/mL    Comment: (NOTE)                                 Negative        <36.0                                 Equivocal 36.0 - 43.9  Positive        >43.9 Performed At: [facility] [place], [place] [id number] Rush Farmer MD [id number]   Heparin level (unfractionated)     Status: None   Collection Time: 08/26/20 12:47 AM  Result Value Ref Range   Heparin Unfractionated 0.41 0.30 - 0.70 IU/mL    Comment: (NOTE) If heparin results are below expected values, and patient dosage has  been confirmed, suggest follow up testing of antithrombin III levels. Performed at [facility], [place] [address], [place], [place] [address]   Hemoglobin A1c     Status: Abnormal   Collection Time: 08/26/20 12:47 AM  Result Value Ref Range   Hgb A1c MFr Bld 7.2 (H) 4.8 - 5.6 %    Comment: (NOTE) Pre diabetes:          5.7%-6.4%  Diabetes:              >6.4%  Glycemic control for   <7.0% adults with diabetes    Mean Plasma Glucose 159.94 mg/dL    Comment: Performed at [place] [address] [place], [place] [address]  Vitamin B12     Status: None   Collection Time: 08/26/20 12:47 AM  Result Value Ref Range   Vitamin B-12 371 180 - 914 pg/mL    Comment: (NOTE) This assay is not validated for testing neonatal or myeloproliferative syndrome specimens for Vitamin B12 levels. Performed at [facility], [place] [address] [place], [place] [address]   Folate     Status: None   Collection Time: 08/26/20 12:47 AM  Result Value Ref Range   Folate 6.6 >5.9 ng/mL    Comment: Performed at [facility], [place] [address] [place], [place] [address]  Iron and TIBC     Status: Abnormal   Collection Time: 08/26/20 12:47 AM  Result Value Ref Range   Iron 33 28 - 170 ug/dL   TIBC 186 (L) 250 - 450 ug/dL   Saturation Ratios  18 10.4 - 31.8 %   UIBC 153 ug/dL    Comment: Performed at [facility], [place] [address], [place], [place] [address]  Ferritin     Status: None   Collection Time: 08/26/20 12:47 AM  Result Value Ref Range   Ferritin 175 11 - 307 ng/mL    Comment: Performed at [facility], [place] [address] [place], [place] [address]  Reticulocytes     Status: Abnormal   Collection Time: 08/26/20 12:47 AM  Result Value Ref Range   Retic Ct Pct 2.2 0.4 - 3.1 %   RBC. 2.66 (L) 3.87 - 5.11 MIL/uL   Retic Count, Absolute 58.5 19.0 - 186.0 K/uL   Immature Retic Fract 21.0 (H) 2.3 - 15.9 %    Comment: Performed at [facility], [place] [address], [place],  [address]  Renal function panel     Status: Abnormal   Collection Time: 08/26/20 12:47 AM  Result Value Ref Range   Sodium 140 135 - 145 mmol/L   Potassium 3.6 3.5 - 5.1 mmol/L   Chloride 106 98 - 111 mmol/L   CO2 26 22 - 32 mmol/L   Glucose, Bld 183 (H) 70 - 99 mg/dL    Comment: Glucose reference range applies only to samples taken after fasting for at least 8 hours.   BUN 7 6 - 20 mg/dL   Creatinine, Ser 1.18 (H) 0.44 - 1.00 mg/dL  Calcium 7.9 (L) 8.9 - 10.3 mg/dL   Phosphorus 4.0 2.5 - 4.6 mg/dL   Albumin 1.9 (L) 3.5 - 5.0 g/dL   GFR, Estimated 56 (L) >60 mL/min    Comment: (NOTE) Calculated using the CKD-EPI Creatinine Equation (2021)    Anion gap 8 5 - 15    Comment: Performed at [facility], [place] [address] [place], [place] [address]  Magnesium     Status: Abnormal   Collection Time: 08/26/20 12:47 AM  Result Value Ref Range   Magnesium 1.6 (L) 1.7 - 2.4 mg/dL    Comment: Performed at [facility], [place] [address] [place], [place] [address]  CBC     Status: Abnormal   Collection Time: 08/26/20 12:47 AM  Result Value Ref Range   WBC 4.9 4.0 - 10.5 K/uL   RBC 2.57 (L) 3.87 - 5.11 MIL/uL   Hemoglobin 7.6 (L) 12.0 - 15.0 g/dL   HCT 23.7 (L)  36.0 - 46.0 %   MCV 92.2 80.0 - 100.0 fL   MCH 29.6 26.0 - 34.0 pg   MCHC 32.1 30.0 - 36.0 g/dL   RDW 12.9 11.5 - 15.5 %   Platelets 348 150 - 400 K/uL   nRBC 0.0 0.0 - 0.2 %    Comment: Performed at [facility], [place] [address] [place], [place] [address]  Brain natriuretic peptide     Status: Abnormal   Collection Time: 08/26/20 12:47 AM  Result Value Ref Range   B Natriuretic Peptide 237.9 (H) 0.0 - 100.0 pg/mL    Comment: Performed at [facility], [place] [address], [place], [place] [address]  Glucose, capillary     Status: None   Collection Time: 08/26/20  3:10 AM  Result Value Ref Range   Glucose-Capillary 91 70 - 99 mg/dL    Comment: Glucose reference range applies only to samples taken after fasting for at least 8 hours.  Glucose, capillary     Status: Abnormal   Collection Time: 08/26/20  7:29 AM  Result Value Ref Range   Glucose-Capillary 63 (L) 70 - 99 mg/dL    Comment: Glucose reference range applies only to samples taken after fasting for at least 8 hours.   Comment 1 Notify RN   Glucose, capillary     Status: Abnormal   Collection Time: 08/26/20  8:26 AM  Result Value Ref Range   Glucose-Capillary 105 (H) 70 - 99 mg/dL    Comment: Glucose reference range applies only to samples taken after fasting for at least 8 hours.   Comment 1 Notify RN   Glucose, capillary     Status: Abnormal   Collection Time: 08/26/20 11:40 AM  Result Value Ref Range   Glucose-Capillary 105 (H) 70 - 99 mg/dL    Comment: Glucose reference range applies only to samples taken after fasting for at least 8 hours.   Comment 1 Notify RN     Assessment/Plan: 1. Chronic heart failure with preserved ejection fraction (HCC) Lungs clear today. No leg edema. Denies cardiac symptoms. Will continue Torsemide. Repeat electrolytes. Follow-up with Cardiology as scheduled.   2. Intractable nausea and vomiting Improving. Repeat labs. Continue current medication regimen,  having her increase her morning reglan to 10 mg. Follow-up with GI as scheduled. Strict ER precautions reviewed with patient.  - CBC with Differential/Platelet - Comprehensive metabolic panel - Magnesium - CBC with Differential/Platelet; Future  3. Uncontrolled type 1 diabetes mellitus with hyperglycemia, with long-term  current use of insulin (HCC) UA with mild protein. No sign of infection. Follow-up with Endo as scheduled for management of DM I.  - POCT Urinalysis Dipstick  This visit occurred during the SARS-CoV-2 public health emergency.  Safety protocols were in place, including screening questions prior to the visit, additional usage of staff PPE, and extensive cleaning of exam room while observing appropriate contact time as indicated for disinfecting solutions.     [name], PA-C

## 2020-08-31 ENCOUNTER — Encounter: Payer: Self-pay | Admitting: Physician Assistant

## 2020-08-31 DIAGNOSIS — K3184 Gastroparesis: Secondary | ICD-10-CM | POA: Insufficient documentation

## 2020-08-31 DIAGNOSIS — R112 Nausea with vomiting, unspecified: Secondary | ICD-10-CM | POA: Diagnosis not present

## 2020-08-31 DIAGNOSIS — R634 Abnormal weight loss: Secondary | ICD-10-CM | POA: Diagnosis not present

## 2020-08-31 DIAGNOSIS — E1143 Type 2 diabetes mellitus with diabetic autonomic (poly)neuropathy: Secondary | ICD-10-CM | POA: Insufficient documentation

## 2020-09-01 ENCOUNTER — Other Ambulatory Visit (INDEPENDENT_AMBULATORY_CARE_PROVIDER_SITE_OTHER): Payer: BC Managed Care – PPO

## 2020-09-01 ENCOUNTER — Other Ambulatory Visit: Payer: Self-pay | Admitting: Emergency Medicine

## 2020-09-01 ENCOUNTER — Other Ambulatory Visit: Payer: BC Managed Care – PPO

## 2020-09-01 ENCOUNTER — Telehealth: Payer: Self-pay | Admitting: Physician Assistant

## 2020-09-01 DIAGNOSIS — K3184 Gastroparesis: Secondary | ICD-10-CM

## 2020-09-01 DIAGNOSIS — R112 Nausea with vomiting, unspecified: Secondary | ICD-10-CM | POA: Diagnosis not present

## 2020-09-01 LAB — CBC WITH DIFFERENTIAL/PLATELET
Basophils Absolute: 0 10*3/uL (ref 0.0–0.1)
Basophils Relative: 0.8 % (ref 0.0–3.0)
Eosinophils Absolute: 0 10*3/uL (ref 0.0–0.7)
Eosinophils Relative: 0.9 % (ref 0.0–5.0)
HCT: 28.5 % — ABNORMAL LOW (ref 36.0–46.0)
Hemoglobin: 9.7 g/dL — ABNORMAL LOW (ref 12.0–15.0)
Lymphocytes Relative: 22.4 % (ref 12.0–46.0)
Lymphs Abs: 1.2 10*3/uL (ref 0.7–4.0)
MCHC: 33.8 g/dL (ref 30.0–36.0)
MCV: 88.4 fl (ref 78.0–100.0)
Monocytes Absolute: 0.2 10*3/uL (ref 0.1–1.0)
Monocytes Relative: 3.9 % (ref 3.0–12.0)
Neutro Abs: 4 10*3/uL (ref 1.4–7.7)
Neutrophils Relative %: 72 % (ref 43.0–77.0)
Platelets: 353 10*3/uL (ref 150.0–400.0)
RBC: 3.23 Mil/uL — ABNORMAL LOW (ref 3.87–5.11)
RDW: 14 % (ref 11.5–15.5)
WBC: 5.5 10*3/uL (ref 4.0–10.5)

## 2020-09-01 LAB — COMPREHENSIVE METABOLIC PANEL
ALT: 15 U/L (ref 0–35)
AST: 17 U/L (ref 0–37)
Albumin: 3 g/dL — ABNORMAL LOW (ref 3.5–5.2)
Alkaline Phosphatase: 70 U/L (ref 39–117)
BUN: 13 mg/dL (ref 6–23)
CO2: 28 mEq/L (ref 19–32)
Calcium: 9 mg/dL (ref 8.4–10.5)
Chloride: 106 mEq/L (ref 96–112)
Creatinine, Ser: 0.87 mg/dL (ref 0.40–1.20)
GFR: 77.86 mL/min (ref >60.00)
Glucose, Bld: 98 mg/dL (ref 70–99)
Potassium: 4 mEq/L (ref 3.5–5.1)
Sodium: 141 mEq/L (ref 135–145)
Total Bilirubin: 0.4 mg/dL (ref 0.2–1.2)
Total Protein: 6.3 g/dL (ref 6.0–8.3)

## 2020-09-01 LAB — MAGNESIUM: Magnesium: 1.8 mg/dL (ref 1.5–2.5)

## 2020-09-01 MED ORDER — SUCRALFATE 1 GM/10ML PO SUSP: 1.0000 g | Freq: Three times a day (TID) | ORAL | 6 refills | Status: DC

## 2020-09-01 NOTE — Telephone Encounter (Signed)
..  Medication Refills  Last OV:  Medication:  Patient is not sure of the name - said it is a nausea medication that was prescribed by the ER doctor.  Patient states that Einar Pheasant wanted her to keep taking it so she needs a prescription for it.  Pharmacy:  [facility]  Let patient know to contact pharmacy at the end of the day to make sure medication is ready.   Please notify patient to allow 48-72 hours to process.  Encourage patient to contact the pharmacy for refills or they can request refills through [place] out below:   Last refill:  QTY:  Refill Date:    Other Comments:   Okay for refill?  Please advise.

## 2020-09-01 NOTE — Telephone Encounter (Signed)
Spoke with patient and she states the pharmacy wanted authorization for the Sucralfate suspension/liquid.  They were sending to the ER doctor [facility] wanted [place] to prescribe so patient can start taking liquid form. Rx for Suc[facility]fate sent [facility] the [facility]

## 2020-09-04 ENCOUNTER — Telehealth: Payer: Self-pay | Admitting: Physician Assistant

## 2020-09-04 NOTE — Telephone Encounter (Signed)
Patient needs to know about when she should be able to return to work.  Please advise.

## 2020-09-04 NOTE — Progress Notes (Deleted)
Cardiology Office Note:    Date:  09/04/2020   ID:  [name], DOB 10-13-1970, MRN [id number]  PCP:  Megan Jeans, PA-C  Cardiologist:  Megan Grooms, MD   Referring MD: Megan Jeans, PA-C   No chief complaint on file.   History of Present Illness:    [name] is a 50 y.o. female with a hx of acute on chronic diastolic heart failure, essential hypertension,type 1 diabetes mellitus, migraine headaches,autonomic neuropathy, orthostatic hypotension on Imdur/heart failure therapy associated with syncope, recent splenic infarction, and hospital stay for intractable nausea & vomiting. Recent alteration in medical therapy.  ***  Past Medical History:  Diagnosis Date  . Anemia   . Anxiety   . Arthritis   . Asthma   . CHF (congestive heart failure) ([place])   . Depression   . History of chicken pox   . Migraines   . Mood disorder (HCC)    anxiety  . Prurigo nodularis    with diabetic dermopathy  . Type 1 diabetes, uncontrolled, with neuropathy ([place])    [place]    Past Surgical History:  Procedure Laterality Date  . BIOPSY  08/11/2020   Procedure: BIOPSY;  Surgeon: Megan Artist, MD;  Location: [facility];  Service: Endoscopy;;  . ESOPHAGOGASTRODUODENOSCOPY (EGD) WITH PROPOFOL N/A 08/11/2020   Procedure: ESOPHAGOGASTRODUODENOSCOPY (EGD) WITH PROPOFOL;  Surgeon: Megan Artist, MD;  Location: [facility];  Service: Endoscopy;  Laterality: N/A;  . treadmill stress test  01/2013   WNL, low risk study    Current Medications: No outpatient medications have been marked as taking for the 09/06/20 encounter (Appointment) with Megan Crome, MD.     Allergies:   Amoxicillin, Dulaglutide, Miconazole nitrate, and Lantus [insulin glargine]   Social History   Socioeconomic History  . Marital status: Legally Separated    Spouse name: Not on file  . Number of children: 2  . Years of education: Not on file  . Highest education level: Not  on file  Occupational History  . Occupation: Freight forwarder  Tobacco Use  . Smoking status: Never Smoker  . Smokeless tobacco: Never Used  Vaping Use  . Vaping Use: Never used  Substance and Sexual Activity  . Alcohol use: Not Currently  . Drug use: No  . Sexual activity: Yes    Partners: Male    Birth control/protection: None  Other Topics Concern  . Not on file  Social History Narrative   Caffeine use: daily   Occupation: cleans houses   Regular exercise: yes, active 5x/wk   Diet: good water, fruits/vegetables daily   Social Determinants of Health   Financial Resource Strain: Not on file  Food Insecurity: Not on file  Transportation Needs: Not on file  Physical Activity: Not on file  Stress: Not on file  Social Connections: Not on file     Family History: The patient's family history includes Bipolar disorder in her mother; CAD in her maternal aunt; Calcium disorder in her mother; Cancer in her maternal grandmother, maternal uncle, and paternal grandmother; Cancer (age of onset: 24) in her maternal aunt; Diabetes in her mother, paternal grandfather, paternal grandmother, and sister; Heart disease in her mother; Heart failure in her sister; Hypertension in her mother; Stroke in her maternal aunt; Thyroid disease in her mother.  ROS:   Please see the history of present illness.    *** All other systems reviewed and are negative.  EKGs/Labs/Other Studies Reviewed:    The following  studies were reviewed today: ***  EKG:  EKG ***  Recent Labs: 01/31/2020: NT-Pro BNP 1,696 03/27/2020: TSH 2.00 08/26/2020: B Natriuretic Peptide 237.9 08/30/2020: ALT 15; BUN 13; Creatinine, Ser 0.87; Magnesium 1.8; Potassium 4.0; Sodium 141 09/01/2020: Hemoglobin 9.7; Platelets 353.0  Recent Lipid Panel    Component Value Date/Time   CHOL 205 (H) 01/31/2020 1008   TRIG 70 01/31/2020 1008   HDL 73 01/31/2020 1008   CHOLHDL 2.9 12/03/2019 0952   CHOLHDL 5.2 10/12/2019 0834   VLDL 16 10/12/2019  0834   LDLCALC 119 (H) 01/31/2020 1008   LDLDIRECT 104.1 04/12/2014 1608    Physical Exam:    VS:  There were no vitals taken for this visit.    Wt Readings from Last 3 Encounters:  08/30/20 173 lb (78.5 kg)  08/25/20 180 lb 4.8 oz (81.8 kg)  08/11/20 181 lb 7 oz (82.3 kg)     GEN: ***. No acute distress HEENT: Normal NECK: No JVD. LYMPHATICS: No lymphadenopathy CARDIAC: *** murmur. RRR *** gallop, or edema. VASCULAR: *** Normal Pulses. No bruits. RESPIRATORY:  Clear to auscultation without rales, wheezing or rhonchi  ABDOMEN: Soft, non-tender, non-distended, No pulsatile mass, MUSCULOSKELETAL: No deformity  SKIN: Warm and dry NEUROLOGIC:  Alert and oriented x 3 PSYCHIATRIC:  Normal affect   ASSESSMENT:    1. Chronic diastolic heart failure ([place])   2. Type 1 diabetes mellitus with complications (HCC)   3. Mixed hyperlipidemia   4. Leg edema   5. Neurogenic orthostatic hypotension (HCC)   6. Anemia of chronic disease   7. Dyspnea on exertion   8. Educated about COVID-19 virus infection    PLAN:    In order of problems listed above:  1. ***   Medication Adjustments/Labs and Tests Ordered: Current medicines are reviewed at length with the patient today.  Concerns regarding medicines are outlined above.  No orders of the defined types were placed in this encounter.  No orders of the defined types were placed in this encounter.   There are no Patient Instructions on file for this visit.   Signed, Megan Grooms, MD  09/04/2020 5:18 PM    [facility]

## 2020-09-04 NOTE — Telephone Encounter (Signed)
Spoke with patient about her paperwork. Will fax to her [place] [phone]

## 2020-09-06 ENCOUNTER — Ambulatory Visit: Payer: BC Managed Care – PPO | Admitting: Interventional Cardiology

## 2020-09-06 DIAGNOSIS — R600 Localized edema: Secondary | ICD-10-CM

## 2020-09-06 DIAGNOSIS — E108 Type 1 diabetes mellitus with unspecified complications: Secondary | ICD-10-CM

## 2020-09-06 DIAGNOSIS — E782 Mixed hyperlipidemia: Secondary | ICD-10-CM

## 2020-09-06 DIAGNOSIS — D638 Anemia in other chronic diseases classified elsewhere: Secondary | ICD-10-CM

## 2020-09-06 DIAGNOSIS — G903 Multi-system degeneration of the autonomic nervous system: Secondary | ICD-10-CM

## 2020-09-06 DIAGNOSIS — I5032 Chronic diastolic (congestive) heart failure: Secondary | ICD-10-CM

## 2020-09-06 DIAGNOSIS — Z7189 Other specified counseling: Secondary | ICD-10-CM

## 2020-09-06 DIAGNOSIS — R0600 Dyspnea, unspecified: Secondary | ICD-10-CM

## 2020-09-12 ENCOUNTER — Ambulatory Visit (INDEPENDENT_AMBULATORY_CARE_PROVIDER_SITE_OTHER): Payer: BC Managed Care – PPO | Admitting: Nurse Practitioner

## 2020-09-12 ENCOUNTER — Encounter: Payer: Self-pay | Admitting: Nurse Practitioner

## 2020-09-12 VITALS — BP 118/80 | HR 97 | Ht 72.0 in | Wt 170.0 lb

## 2020-09-12 DIAGNOSIS — R112 Nausea with vomiting, unspecified: Secondary | ICD-10-CM

## 2020-09-12 DIAGNOSIS — K5900 Constipation, unspecified: Secondary | ICD-10-CM

## 2020-09-12 MED ORDER — PROMETHAZINE HCL 12.5 MG PO TABS: 12.5000 mg | ORAL_TABLET | Freq: Four times a day (QID) | ORAL | 2 refills | Status: DC | PRN

## 2020-09-12 MED ORDER — PANTOPRAZOLE SODIUM 40 MG PO TBEC: 40.0000 mg | DELAYED_RELEASE_TABLET | Freq: Two times a day (BID) | ORAL | 3 refills | Status: DC

## 2020-09-12 MED ORDER — METOCLOPRAMIDE HCL 5 MG PO TABS: 5.0000 mg | ORAL_TABLET | Freq: Three times a day (TID) | ORAL | 3 refills | Status: DC

## 2020-09-12 NOTE — Patient Instructions (Signed)
If you are age 51 or older, your body mass index should be between 23-30. Your Body mass index is 23.06 kg/m. If this is out of the aforementioned range listed, please consider follow up with your Primary Care Provider.  If you are age 31 or younger, your body mass index should be between 19-25. Your Body mass index is 23.06 kg/m. If this is out of the aformentioned range listed, please consider follow up with your Primary Care Provider.   We have sent the following medications to your pharmacy for you to pick up at your convenience: Reglan , Pantoprazole and Phenergan.  Follow up appointment with Dr.Danis 3/29 @ 2pm  Thank you for choosing me and [facility].  Tye Savoy, NP

## 2020-09-12 NOTE — Progress Notes (Signed)
ASSESSMENT AND PLAN    # Chronic anemia, followed by Hematology,  last seen there on 06/16/20. Received IV iron a few months ago. Anemia felt to be multifactorial with component of iron deficiency and anemia of chronic disease. Hgb is stable at 9.7.  --[name] still needs a colonoscopy to evaluate anemia and history of rectal bleeding which has now resolved. However, I have concerns about proceeding with the procedure right now. She had an abnormal echocardiogram 08/25/20. She had a recent splenic infarct ( ? Etiology). Additionally she has been hospitalized twice in the last month with nausea and vomiting so I don't know that she can tolerate the prep. Reglan is helping but still requiring occasional low dose phenergan for breakthrough nausea and vomiting.  --I am going to bring patient back to see Dr. Loletha Carrow in late March to discuss colonoscopy. By then she will have seen Cardiology.   # Chronic nausea and vomiting. Recent EGD unrevealing. Normal gastric emptying study in 2020 but could still have delayed gastric emptying. Thankfully her blood glucose has been better controlled these days. Hgb A1c ~ 7 now --Reglan is helping. Will refill 5 mg ac and HS for her. We discussed potential side effects of this medication which is not always reversible. She will stop Reglan if she experiences any of these side effects.    # Esophagitis, grade C.  Chronic vomiting likely contributing factor.  --Continue daily PPI, anti-emetics.   # Chronic diastolic CHF. Echo  EF 40-45%  Large pleural effusion on CT scan echo but CxR same day showed only small effusion.   --She sees Cardiology 09/26/20    HISTORY OF PRESENT ILLNESS     Primary Gastroenterologist : Wilfrid Lund, MD  Chief Complaint : hospital follow up  [name] is a 50 y.o. female with PMH / [place] significant for,  but not necessarily limited to: DM on insulin pump, diastolic CHF, HTN, hx of sickle cell trait, diverticulosis,  anxiety  [name] was seen in the office as a new patient April 2021 with multiple GI complaints including abdominal pain, nausea, vomiting, GERD, bloating, weight loss, and rectal bleeding. Also, noted to be anemic. She was scheduled for a colonoscopy but we later cancelled it due to elevated BNP and uncontrolled DM.  She did not make the requested follow up appt here in June.   We saw [facility] in the hospital early last month for evaluation of ongoing N/V and generalized upper abdominal pain in setting of hyperglycemia after her insulin pump fell out. CT scan w/ contrast showed mild diffuse distal esophageal wall thickening, a small hiatal hernia, and a hyperdense soft tissue mass in uterus.  Inpatient EGD with biopsies showed grade C esophagitis,mild chronic gastritis without H.pylori. She was advised to continue PPI and anti-emetics upon discharge. Plan was for office follow up and outpatient colonoscopy to evaluate anemia. However, in the interim patient was rehospitalized 08/16/20 with left sided abdominal pain, nausea / vomiting.  CTA  scan  was concerning for splenic infarct. Evaluated by [facility], there was no indication for surgery.  Patient also treated for UTI that admission. Her GI symptoms resolved, she was discharged home on Reglan on 08/26/20.    Interval History:   [name] says her nausea and vomiting are so much better after starting Reglan. Feels Reglan has also helped with chronic constipation though she is still taking daily Miralax. She hasn't had any further rectal bleeding since we saw her last April.  Data Reviewed: 08/25/20 Echo Left ventricular ejection fraction, by estimation, is 40 to 45%. The left ventricle has mildlydecreased function. The left ventricle demonstrates global hypokinesis. The left ventricular internal cavity size was mildly dilated. Left ventricular diastolic parameters are indeterminate. 2. Right ventricular systolic function is normal. The right ventricular size is  normal. There is moderately elevated pulmonary artery systolic pressure. 3. Left atrial size was moderately dilated. 4. Large pleural effusion in the left lateral region. 5. The mitral valve is normal in structure. Mild mitral valve regurgitation. No evidence of mitral stenosis. 6. The aortic valve is tricuspid. Aortic valve regurgitation is not visualized. No aortic stenosis ispresent. 7. The inferior vena cava is dilated in size with <50% respiratory variability, suggesting right atrial pressure of 15 mmHg.   Previous Endoscopic Evaluations / Pertinent Studies:  08/11/20 EGD -Grade C esophagitis - Focal erythematous mucosa in the gastric fundus. Biopsied. - A few gastric polyps. Biopsied. - Normal duodenal bulb and second portion of the duodenum.  FINAL MICROSCOPIC DIAGNOSIS:   A. STOMACH, BODY, POLYPECTOMY:  - Fundic gland polyp.   B. STOMACH, BODY AND ANTRUM, BIOPSY:  - Gastric antral mucosa with mild reactive gastropathy.  - Gastric oxyntic mucosa with mild chronic gastritis.  - Warthin-Starry stain is negative for Helicobacter pylori.   C. STOMACH, FUNDUS, BIOPSY:  - Gastric oxyntic mucosa with mild chronic gastritis.    Past Medical History:  Diagnosis Date  . Anemia   . Anxiety   . Arthritis   . Asthma   . CHF (congestive heart failure) ([place])   . Depression   . History of chicken pox   . Migraines   . Mood disorder (HCC)    anxiety  . Prurigo nodularis    with diabetic dermopathy  . Type 1 diabetes, uncontrolled, with neuropathy (HCC)    [place]    Current Medications, Allergies, Past Surgical History, Family History and Social History were reviewed in [employer] record.   Current Outpatient Medications  Medication Sig Dispense Refill  . albuterol (PROAIR HFA) 108 (90 Base) MCG/ACT inhaler Inhale 2 puffs into the lungs every 6 (six) hours as needed for wheezing or shortness of breath. 6.7 g 1  . atorvastatin (LIPITOR) 10 MG tablet Take  10 mg by mouth daily.    . carvedilol (COREG) 12.5 MG tablet Take 0.5 tablets (6.25 mg total) by mouth 2 (two) times daily. 180 tablet 1  . Continuous Blood Gluc Sensor (FREESTYLE LIBRE 2 SENSOR) MISC CHANGE EVERY 14 DAYS    . feeding supplement, GLUCERNA SHAKE, (GLUCERNA SHAKE) LIQD Take 237 mLs by mouth 2 (two) times daily between meals.  0  . hydrOXYzine (ATARAX/VISTARIL) 25 MG tablet Take 1 tablet (25 mg total) by mouth 3 (three) times daily as needed. (Patient taking differently: Take 25 mg by mouth 3 (three) times daily as needed for anxiety or itching.) 60 tablet 2  . Insulin Disposable Pump (OMNIPOD 10 PACK) MISC by Does not apply route.    Marland Kitchen LORazepam (ATIVAN) 0.5 MG tablet TAKE 1 TABLET BY MOUTH EVERY 8 HOURS AS NEEDED FOR ANXIETY. (Patient taking differently: Take 0.5 mg by mouth every 8 (eight) hours as needed for anxiety.) 20 tablet 0  . metoCLOPramide (REGLAN) 5 MG tablet Take 1 tablet (5 mg total) by mouth every 8 (eight) hours as needed for up to 20 days for nausea or vomiting. 60 tablet 0  . NOVOLOG 100 UNIT/ML injection SMARTSIG:0-120 Unit(s) SUB-Q Daily    . pantoprazole (  PROTONIX) 40 MG tablet Take 1 tablet (40 mg total) by mouth 2 (two) times daily. 60 tablet 0  . sucralfate (CARAFATE) 1 GM/10ML suspension Take 10 mLs (1 g total) by mouth 4 (four) times daily -  before meals and at bedtime. 1200 mL 6  . torsemide (DEMADEX) 20 MG tablet TAKE 1 TABLET (20 MG TOTAL) BY MOUTH EVERY MONDAY, WEDNESDAY, AND FRIDAY. 135 tablet 1   No current facility-administered medications for this visit.    Review of Systems: No chest pain. No shortness of breath. No urinary complaints.   PHYSICAL EXAM :    Wt Readings from Last 3 Encounters:  08/30/20 173 lb (78.5 kg)  08/25/20 180 lb 4.8 oz (81.8 kg)  08/11/20 181 lb 7 oz (82.3 kg)    BP 118/80   Pulse 97   Ht 6' (1.829 m)   Wt 170 lb (77.1 kg)   SpO2 99%   BMI 23.06 kg/m  Constitutional:  Pleasant thin female in no acute  distress. Psychiatric: Normal mood and affect. Behavior is normal. EENT: Pupils normal.  Conjunctivae are normal. No scleral icterus. Neck supple.  Cardiovascular: Normal rate, regular rhythm. No edema Pulmonary/chest: Effort normal and breath sounds normal. No wheezing, rales or rhonchi. Abdominal: Soft, nondistended, nontender. Bowel sounds active throughout. There are no masses palpable. No hepatomegaly. Neurological: Alert and oriented to person place and time. Skin: Skin is warm and dry. No rashes noted.  Tye Savoy, NP  09/12/2020, 9:12 AM

## 2020-09-13 ENCOUNTER — Telehealth: Payer: Self-pay | Admitting: Nurse Practitioner

## 2020-09-13 ENCOUNTER — Telehealth: Payer: Self-pay | Admitting: Physician Assistant

## 2020-09-13 NOTE — Progress Notes (Signed)
____________________________________________________________  Attending physician addendum:  Thank you for sending this case to me. I have reviewed the entire note and agree with the plan.  I agree that more time of N/V and therefore (hopefully) glucose control would be helpful before undergoing colonoscopy prep and sedation. Will plan for that at follow up.  Wilfrid Lund, MD  ____________________________________________________________

## 2020-09-13 NOTE — Telephone Encounter (Signed)
Sent patient a work note through [employer] and printed a copy put for patient to pick up.

## 2020-09-13 NOTE — Telephone Encounter (Signed)
Patient needs a note to return to work.  Please advise.

## 2020-09-13 NOTE — Telephone Encounter (Signed)
Pt is wanting to clarify the note needs to say as a follow up from the hospital and not the office she was seen yesterday.

## 2020-09-13 NOTE — Telephone Encounter (Signed)
Patient also reached out to GI for return back to work note.

## 2020-09-13 NOTE — Telephone Encounter (Signed)
Pt is re [name] a call back from a nurse to discuss a return back to work note she needs.

## 2020-09-14 NOTE — Telephone Encounter (Signed)
Patient calling back stating she really needs note as soon as possible to return to work please.

## 2020-09-15 ENCOUNTER — Inpatient Hospital Stay: Payer: BC Managed Care – PPO | Attending: Hematology and Oncology

## 2020-09-15 NOTE — Telephone Encounter (Signed)
After speaking with Dr.Danis a letter was sent to Patient stating she would return to work. I also sent a letter stating that patient needs to get Cardiology input on returning back to work per [employer].

## 2020-09-22 ENCOUNTER — Ambulatory Visit: Payer: BC Managed Care – PPO

## 2020-09-22 ENCOUNTER — Telehealth: Payer: Self-pay

## 2020-09-22 ENCOUNTER — Ambulatory Visit: Payer: BC Managed Care – PPO | Admitting: Hematology and Oncology

## 2020-09-22 NOTE — Telephone Encounter (Signed)
TC to Pt to inquire about missed appointment. VM message states mailbox is full.

## 2020-09-27 NOTE — Progress Notes (Unsigned)
Cardiology Office Note:    Date:  09/28/2020   ID:  [name], DOB 1971/06/07, MRN [id number]  PCP:  [name], PA-C  Cardiologist:  Sinclair Grooms, MD   Referring MD: [name], PA-C   Chief Complaint  Patient presents with  . Congestive Heart Failure  . Hyperlipidemia  . Hypertension    History of Present Illness:    [name] is a 50 y.o. female with a hx of acute on chronic diastolic heart failure, essential hypertension,type 1 diabetes mellitus, migraine headaches, autonomic and peripheral neuropathy,and hypotension on Imdur/heart failure therapy associated with syncope.  Recently hospitalized for intractable nausea and vomiting.  Following hospital had "racing heart and palpitations".  No specific arrhythmias were identified.  I have personally reviewed the EKGs referenced from the hospital visit.  An echocardiogram was done during the hospital stay.  She is back at work.  She has difficulty ambulating.  She has autonomic and peripheral neuropathy.  Past Medical History:  Diagnosis Date  . Anemia   . Anxiety   . Arthritis   . Asthma   . CHF (congestive heart failure) ([place])   . Depression   . History of chicken pox   . Migraines   . Mood disorder (HCC)    anxiety  . Prurigo nodularis    with diabetic dermopathy  . Type 1 diabetes, uncontrolled, with neuropathy ([place])    [place]    Past Surgical History:  Procedure Laterality Date  . BIOPSY  08/11/2020   Procedure: BIOPSY;  Surgeon: Ladene Artist, MD;  Location: [facility];  Service: Endoscopy;;  . ESOPHAGOGASTRODUODENOSCOPY (EGD) WITH PROPOFOL N/A 08/11/2020   Procedure: ESOPHAGOGASTRODUODENOSCOPY (EGD) WITH PROPOFOL;  Surgeon: Ladene Artist, MD;  Location: [facility];  Service: Endoscopy;  Laterality: N/A;  . treadmill stress test  01/2013   WNL, low risk study    Current Medications: Current Meds  Medication Sig  . albuterol (PROAIR HFA) 108 (90 Base)  MCG/ACT inhaler Inhale 2 puffs into the lungs every 6 (six) hours as needed for wheezing or shortness of breath.  Marland Kitchen atorvastatin (LIPITOR) 10 MG tablet Take 10 mg by mouth daily.  . carvedilol (COREG) 12.5 MG tablet Take 0.5 tablets (6.25 mg total) by mouth 2 (two) times daily.  . Continuous Blood Gluc Sensor (FREESTYLE LIBRE 2 SENSOR) MISC CHANGE EVERY 14 DAYS  . feeding supplement, GLUCERNA SHAKE, (GLUCERNA SHAKE) LIQD Take 237 mLs by mouth 2 (two) times daily between meals.  . hydrOXYzine (ATARAX/VISTARIL) 25 MG tablet Take 1 tablet (25 mg total) by mouth 3 (three) times daily as needed.  . Insulin Disposable Pump (OMNIPOD 10 PACK) MISC by Does not apply route.  Marland Kitchen LORazepam (ATIVAN) 0.5 MG tablet TAKE 1 TABLET BY MOUTH EVERY 8 HOURS AS NEEDED FOR ANXIETY.  . metoCLOPramide (REGLAN) 5 MG tablet Take 1 tablet (5 mg total) by mouth 4 (four) times daily -  before meals and at bedtime for 20 days.  Marland Kitchen NOVOLOG 100 UNIT/ML injection SMARTSIG:0-120 Unit(s) SUB-Q Daily  . pantoprazole (PROTONIX) 40 MG tablet Take 1 tablet (40 mg total) by mouth 2 (two) times daily.  . promethazine (PHENERGAN) 12.5 MG tablet Take 1 tablet (12.5 mg total) by mouth every 6 (six) hours as needed for nausea or vomiting.  . torsemide (DEMADEX) 20 MG tablet TAKE 1 TABLET (20 MG TOTAL) BY MOUTH EVERY MONDAY, WEDNESDAY, AND FRIDAY.     Allergies:   Amoxicillin, Dulaglutide, Miconazole nitrate, and Lantus [insulin glargine]  Social History   Socioeconomic History  . Marital status: Legally Separated    Spouse name: Not on file  . Number of children: 2  . Years of education: Not on file  . Highest education level: Not on file  Occupational History  . Occupation: Freight forwarder  Tobacco Use  . Smoking status: Never Smoker  . Smokeless tobacco: Never Used  Vaping Use  . Vaping Use: Never used  Substance and Sexual Activity  . Alcohol use: Not Currently  . Drug use: No  . Sexual activity: Yes    Partners: Male    Birth  control/protection: None  Other Topics Concern  . Not on file  Social History Narrative   Caffeine use: daily   Occupation: cleans houses   Regular exercise: yes, active 5x/wk   Diet: good water, fruits/vegetables daily   Social Determinants of Health   Financial Resource Strain: Not on file  Food Insecurity: Not on file  Transportation Needs: Not on file  Physical Activity: Not on file  Stress: Not on file  Social Connections: Not on file     Family History: The patient's family history includes Bipolar disorder in her mother; CAD in her maternal aunt; Calcium disorder in her mother; Cancer in her maternal grandmother, maternal uncle, and paternal grandmother; Cancer (age of onset: 90) in her maternal aunt; Diabetes in her mother, paternal grandfather, paternal grandmother, and sister; Heart disease in her mother; Heart failure in her sister; Hypertension in her mother; Stroke in her maternal aunt; Thyroid disease in her mother.  ROS:   Please see the history of present illness.    She is miserable.  She has difficulty ambulating because of neuropathy.  She feels that she is going to fall quite frequently.  She has numbness and tingling in her hands, feet, and her left arm.  All other systems reviewed and are negative.  EKGs/Labs/Other Studies Reviewed:    The following studies were reviewed today: Not performed.  EKG:  EKG is reviewed from the January hospital stay demonstrate on 08/24/2020 tachycardia with rate of 107 bpm and nonspecific T wave flattening.  Recent Labs: 01/31/2020: NT-Pro BNP 1,696 03/27/2020: TSH 2.00 08/26/2020: B Natriuretic Peptide 237.9 08/30/2020: ALT 15; BUN 13; Creatinine, Ser 0.87; Magnesium 1.8; Potassium 4.0; Sodium 141 09/01/2020: Hemoglobin 9.7; Platelets 353.0  Recent Lipid Panel    Component Value Date/Time   CHOL 205 (H) 01/31/2020 1008   TRIG 70 01/31/2020 1008   HDL 73 01/31/2020 1008   CHOLHDL 2.9 12/03/2019 0952   CHOLHDL 5.2 10/12/2019  0834   VLDL 16 10/12/2019 0834   LDLCALC 119 (H) 01/31/2020 1008   LDLDIRECT 104.1 04/12/2014 1608    Physical Exam:    VS:  BP (!) 148/82   Pulse 100   Ht 6' (1.829 m)   Wt 172 lb 6.4 oz (78.2 kg)   SpO2 99%   BMI 23.38 kg/m     Wt Readings from Last 3 Encounters:  09/28/20 172 lb 6.4 oz (78.2 kg)  09/12/20 170 lb (77.1 kg)  08/30/20 173 lb (78.5 kg)     GEN: Weight is up to 172 pounds which is stable compared to the past 2 months.  Hair is colored with red test.. No acute distress HEENT: Normal NECK: No JVD. LYMPHATICS: No lymphadenopathy CARDIAC: No murmur. RRR S4 gallop, 2+ bilateral ankle edema. VASCULAR:  Normal Pulses. No bruits. RESPIRATORY:  Clear to auscultation without rales, wheezing or rhonchi  ABDOMEN: Soft, non-tender, non-distended, No pulsatile  mass, MUSCULOSKELETAL: No deformity  SKIN: Warm and dry NEUROLOGIC:  Alert and oriented x 3 PSYCHIATRIC:  Normal affect   ASSESSMENT:    1. Chronic diastolic heart failure ([place])   2. Orthostatic hypotension   3. Type 1 diabetes mellitus with complications ([place])   4. Mixed hyperlipidemia   5. Decreased mobility   6. Palpitations   7. Educated about COVID-19 virus infection    PLAN:    In order of problems listed above:  1. Slight volume excess.  Would not recommend higher dose significant increase in Demadex as she is volume sensitive and orthostatic hypotension becomes a significant problem. 2. Did not measure orthostatic pressures today.  She is not symptomatic with standing.  Continue carvedilol 6.25 mg twice daily. 3. Managed by primary care. 4. Continue Lipitor 10 mg/day. 5. She has difficulty ambulating related to neuropathy and walks with a cane.  She has permanent parking disability permit.  I signed to have her permit renewed. 6. If palpitations continue, we may consider long-term monitoring 7. Vaccinated and practicing social distancing.  .  Medication Adjustments/Labs and Tests  Ordered: Current medicines are reviewed at length with the patient today.  Concerns regarding medicines are outlined above.  No orders of the defined types were placed in this encounter.  No orders of the defined types were placed in this encounter.   Patient Instructions  Medication Instructions:  Your physician recommends that you continue on your current medications as directed. Please refer to the Current Medication list given to you today.  *If you need a refill on your cardiac medications before your next appointment, please call your pharmacy*   Lab Work: None If you have labs (blood work) drawn today and your tests are completely normal, you will receive your results only by: Marland Kitchen MyChart Message (if you have MyChart) OR . A paper copy in the mail If you have any lab test that is abnormal or we need to change your treatment, we will call you to review the results.   Testing/Procedures: None   Follow-Up: At [facility], you and your health needs are our priority.  As part of our continuing mission to provide you with exceptional heart care, we have created designated Provider Care Teams.  These Care Teams include your primary Cardiologist (physician) and Advanced Practice Providers (APPs -  Physician Assistants and Nurse Practitioners) who all work together to provide you with the care you need, when you need it.  We recommend signing up for the patient portal called "MyChart".  Sign up information is provided on this After Visit Summary.  MyChart is used to connect with patients for Virtual Visits (Telemedicine).  Patients are able to view lab/test results, encounter notes, upcoming appointments, etc.  Non-urgent messages can be sent to your provider as well.   To learn more about what you can do with MyChart, go to [email or web address].    Your next appointment:   6 month(s)  The format for your next appointment:   In Person  Provider:   You may see Sinclair Grooms, MD or one of the following Advanced Practice Providers on your designated Care Team:    Kathyrn Drown, NP    Other Instructions      Signed, Sinclair Grooms, MD  09/28/2020 5:50 PM    [place]

## 2020-09-28 ENCOUNTER — Other Ambulatory Visit: Payer: Self-pay

## 2020-09-28 ENCOUNTER — Encounter: Payer: Self-pay | Admitting: Interventional Cardiology

## 2020-09-28 ENCOUNTER — Encounter: Payer: Self-pay | Admitting: *Deleted

## 2020-09-28 ENCOUNTER — Ambulatory Visit (INDEPENDENT_AMBULATORY_CARE_PROVIDER_SITE_OTHER): Payer: BC Managed Care – PPO | Admitting: Interventional Cardiology

## 2020-09-28 VITALS — BP 148/82 | HR 100 | Ht 72.0 in | Wt 172.4 lb

## 2020-09-28 DIAGNOSIS — E108 Type 1 diabetes mellitus with unspecified complications: Secondary | ICD-10-CM | POA: Diagnosis not present

## 2020-09-28 DIAGNOSIS — R002 Palpitations: Secondary | ICD-10-CM

## 2020-09-28 DIAGNOSIS — I951 Orthostatic hypotension: Secondary | ICD-10-CM | POA: Diagnosis not present

## 2020-09-28 DIAGNOSIS — E782 Mixed hyperlipidemia: Secondary | ICD-10-CM

## 2020-09-28 DIAGNOSIS — I5032 Chronic diastolic (congestive) heart failure: Secondary | ICD-10-CM

## 2020-09-28 DIAGNOSIS — Z7189 Other specified counseling: Secondary | ICD-10-CM

## 2020-09-28 DIAGNOSIS — R2689 Other abnormalities of gait and mobility: Secondary | ICD-10-CM

## 2020-09-28 NOTE — Patient Instructions (Signed)
Medication Instructions:  Your physician recommends that you continue on your current medications as directed. Please refer to the Current Medication list given to you today.  *If you need a refill on your cardiac medications before your next appointment, please call your pharmacy*   Lab Work: None If you have labs (blood work) drawn today and your tests are completely normal, you will receive your results only by: Marland Kitchen MyChart Message (if you have MyChart) OR . A paper copy in the mail If you have any lab test that is abnormal or we need to change your treatment, we will call you to review the results.   Testing/Procedures: None   Follow-Up: At [facility], you and your health needs are our priority.  As part of our continuing mission to provide you with exceptional heart care, we have created designated Provider Care Teams.  These Care Teams include your primary Cardiologist (physician) and Advanced Practice Providers (APPs -  Physician Assistants and Nurse Practitioners) who all work together to provide you with the care you need, when you need it.  We recommend signing up for the patient portal called "MyChart".  Sign up information is provided on this After Visit Summary.  MyChart is used to connect with patients for Virtual Visits (Telemedicine).  Patients are able to view lab/test results, encounter notes, upcoming appointments, etc.  Non-urgent messages can be sent to your provider as well.   To learn more about what you can do with MyChart, go to [email or web address].    Your next appointment:   6 month(s)  The format for your next appointment:   In Person  Provider:   You may see Sinclair Grooms, MD or one of the following Advanced Practice Providers on your designated Care Team:    Kathyrn Drown, NP    Other Instructions

## 2020-09-29 DIAGNOSIS — E113513 Type 2 diabetes mellitus with proliferative diabetic retinopathy with macular edema, bilateral: Secondary | ICD-10-CM | POA: Diagnosis not present

## 2020-09-29 DIAGNOSIS — H3582 Retinal ischemia: Secondary | ICD-10-CM | POA: Diagnosis not present

## 2020-09-29 DIAGNOSIS — H43822 Vitreomacular adhesion, left eye: Secondary | ICD-10-CM | POA: Diagnosis not present

## 2020-10-20 ENCOUNTER — Encounter: Payer: BC Managed Care – PPO | Admitting: Family Medicine

## 2020-10-20 DIAGNOSIS — Z0289 Encounter for other administrative examinations: Secondary | ICD-10-CM

## 2020-10-31 ENCOUNTER — Ambulatory Visit: Payer: BC Managed Care – PPO | Admitting: Gastroenterology

## 2020-11-02 DIAGNOSIS — Z794 Long term (current) use of insulin: Secondary | ICD-10-CM | POA: Diagnosis not present

## 2020-11-02 DIAGNOSIS — E049 Nontoxic goiter, unspecified: Secondary | ICD-10-CM | POA: Diagnosis not present

## 2020-11-02 DIAGNOSIS — E1140 Type 2 diabetes mellitus with diabetic neuropathy, unspecified: Secondary | ICD-10-CM | POA: Diagnosis not present

## 2020-11-02 DIAGNOSIS — E041 Nontoxic single thyroid nodule: Secondary | ICD-10-CM | POA: Diagnosis not present

## 2020-11-10 DIAGNOSIS — E1165 Type 2 diabetes mellitus with hyperglycemia: Secondary | ICD-10-CM | POA: Diagnosis not present

## 2020-11-10 DIAGNOSIS — E1140 Type 2 diabetes mellitus with diabetic neuropathy, unspecified: Secondary | ICD-10-CM | POA: Diagnosis not present

## 2020-11-10 DIAGNOSIS — E785 Hyperlipidemia, unspecified: Secondary | ICD-10-CM | POA: Diagnosis not present

## 2020-11-10 DIAGNOSIS — Z794 Long term (current) use of insulin: Secondary | ICD-10-CM | POA: Diagnosis not present

## 2020-11-16 DIAGNOSIS — I10 Essential (primary) hypertension: Secondary | ICD-10-CM | POA: Diagnosis not present

## 2020-11-16 DIAGNOSIS — D649 Anemia, unspecified: Secondary | ICD-10-CM | POA: Diagnosis not present

## 2020-11-16 DIAGNOSIS — Z0000 Encounter for general adult medical examination without abnormal findings: Secondary | ICD-10-CM | POA: Diagnosis not present

## 2020-11-16 DIAGNOSIS — E1140 Type 2 diabetes mellitus with diabetic neuropathy, unspecified: Secondary | ICD-10-CM | POA: Diagnosis not present

## 2020-11-16 DIAGNOSIS — I5032 Chronic diastolic (congestive) heart failure: Secondary | ICD-10-CM | POA: Diagnosis not present

## 2020-11-16 DIAGNOSIS — K5909 Other constipation: Secondary | ICD-10-CM | POA: Diagnosis not present

## 2020-12-04 ENCOUNTER — Other Ambulatory Visit: Payer: BC Managed Care – PPO

## 2020-12-05 DIAGNOSIS — E113513 Type 2 diabetes mellitus with proliferative diabetic retinopathy with macular edema, bilateral: Secondary | ICD-10-CM | POA: Diagnosis not present

## 2020-12-06 DIAGNOSIS — K5900 Constipation, unspecified: Secondary | ICD-10-CM | POA: Diagnosis not present

## 2020-12-06 DIAGNOSIS — I10 Essential (primary) hypertension: Secondary | ICD-10-CM | POA: Diagnosis not present

## 2020-12-06 DIAGNOSIS — R809 Proteinuria, unspecified: Secondary | ICD-10-CM | POA: Diagnosis not present

## 2020-12-07 ENCOUNTER — Other Ambulatory Visit: Payer: Self-pay | Admitting: Nurse Practitioner

## 2020-12-07 DIAGNOSIS — E1029 Type 1 diabetes mellitus with other diabetic kidney complication: Secondary | ICD-10-CM | POA: Diagnosis not present

## 2020-12-07 DIAGNOSIS — I10 Essential (primary) hypertension: Secondary | ICD-10-CM | POA: Diagnosis not present

## 2020-12-07 DIAGNOSIS — D509 Iron deficiency anemia, unspecified: Secondary | ICD-10-CM | POA: Diagnosis not present

## 2020-12-07 DIAGNOSIS — R809 Proteinuria, unspecified: Secondary | ICD-10-CM | POA: Diagnosis not present

## 2020-12-13 ENCOUNTER — Other Ambulatory Visit (HOSPITAL_COMMUNITY): Payer: Self-pay | Admitting: Nephrology

## 2020-12-13 DIAGNOSIS — R809 Proteinuria, unspecified: Secondary | ICD-10-CM

## 2020-12-18 ENCOUNTER — Ambulatory Visit: Payer: BC Managed Care – PPO | Admitting: Interventional Cardiology

## 2020-12-23 ENCOUNTER — Other Ambulatory Visit: Payer: Self-pay | Admitting: Interventional Cardiology

## 2021-01-02 DIAGNOSIS — E113513 Type 2 diabetes mellitus with proliferative diabetic retinopathy with macular edema, bilateral: Secondary | ICD-10-CM | POA: Diagnosis not present

## 2021-01-02 DIAGNOSIS — H3582 Retinal ischemia: Secondary | ICD-10-CM | POA: Diagnosis not present

## 2021-01-08 ENCOUNTER — Other Ambulatory Visit: Payer: Self-pay | Admitting: Radiology

## 2021-01-09 ENCOUNTER — Ambulatory Visit (HOSPITAL_COMMUNITY): Admission: RE | Admit: 2021-01-09 | Payer: BC Managed Care – PPO | Source: Ambulatory Visit

## 2021-01-09 ENCOUNTER — Encounter (HOSPITAL_COMMUNITY): Payer: Self-pay

## 2021-02-14 DIAGNOSIS — H43822 Vitreomacular adhesion, left eye: Secondary | ICD-10-CM | POA: Diagnosis not present

## 2021-02-14 DIAGNOSIS — E113513 Type 2 diabetes mellitus with proliferative diabetic retinopathy with macular edema, bilateral: Secondary | ICD-10-CM | POA: Diagnosis not present

## 2021-02-14 DIAGNOSIS — H3582 Retinal ischemia: Secondary | ICD-10-CM | POA: Diagnosis not present

## 2021-02-15 ENCOUNTER — Other Ambulatory Visit: Payer: Self-pay

## 2021-02-15 DIAGNOSIS — F39 Unspecified mood [affective] disorder: Secondary | ICD-10-CM

## 2021-02-15 MED ORDER — HYDROXYZINE HCL 25 MG PO TABS: 25.0000 mg | ORAL_TABLET | Freq: Three times a day (TID) | ORAL | 0 refills | Status: DC | PRN

## 2021-03-05 DIAGNOSIS — T148XXA Other injury of unspecified body region, initial encounter: Secondary | ICD-10-CM | POA: Diagnosis not present

## 2021-03-28 NOTE — Progress Notes (Deleted)
Cardiology Office Note:    Date:  03/28/2021   ID:  [name], DOB 1970/08/23, MRN [id number]  PCP:  [name], PA-C  Cardiologist:  Sinclair Grooms, MD   Referring MD: [name], PA-C   No chief complaint on file.   History of Present Illness:    [name] is a 50 y.o. female with a hx of  acute on chronic diastolic heart failure, essential hypertension, type 1 diabetes mellitus, migraine headaches, autonomic and peripheral neuropathy, and hypotension on Imdur/heart failure therapy associated with syncope.   ***  Past Medical History:  Diagnosis Date   Anemia    Anxiety    Arthritis    Asthma    CHF (congestive heart failure) ([place])    Depression    History of chicken pox    Migraines    Mood disorder ([place])    anxiety   Prurigo nodularis    with diabetic dermopathy   Type 1 diabetes, uncontrolled, with neuropathy ([place])    [place]    Past Surgical History:  Procedure Laterality Date   BIOPSY  08/11/2020   Procedure: BIOPSY;  Surgeon: Ladene Artist, MD;  Location: [facility];  Service: Endoscopy;;   ESOPHAGOGASTRODUODENOSCOPY (EGD) WITH PROPOFOL N/A 08/11/2020   Procedure: ESOPHAGOGASTRODUODENOSCOPY (EGD) WITH PROPOFOL;  Surgeon: Ladene Artist, MD;  Location: [facility];  Service: Endoscopy;  Laterality: N/A;   treadmill stress test  01/2013   WNL, low risk study    Current Medications: No outpatient medications have been marked as taking for the 03/30/21 encounter (Appointment) with [name], MD.     Allergies:   Amoxicillin, Dulaglutide, Miconazole nitrate, and Lantus [insulin glargine]   Social History   Socioeconomic History   Marital status: Legally Separated    Spouse name: Not on file   Number of children: 2   Years of education: Not on file   Highest education level: Not on file  Occupational History   Occupation: Freight forwarder  Tobacco Use   Smoking status: Never   Smokeless tobacco: Never   Vaping Use   Vaping Use: Never used  Substance and Sexual Activity   Alcohol use: Not Currently   Drug use: No   Sexual activity: Yes    Partners: Male    Birth control/protection: None  Other Topics Concern   Not on file  Social History Narrative   Caffeine use: daily   Occupation: cleans houses   Regular exercise: yes, active 5x/wk   Diet: good water, fruits/vegetables daily   Social Determinants of Health   Financial Resource Strain: Not on file  Food Insecurity: Not on file  Transportation Needs: Not on file  Physical Activity: Not on file  Stress: Not on file  Social Connections: Not on file     Family History: The patient's family history includes Bipolar disorder in her mother; CAD in her maternal aunt; Calcium disorder in her mother; Cancer in her maternal grandmother, maternal uncle, and paternal grandmother; Cancer (age of onset: 70) in her maternal aunt; Diabetes in her mother, paternal grandfather, paternal grandmother, and sister; Heart disease in her mother; Heart failure in her sister; Hypertension in her mother; Stroke in her maternal aunt; Thyroid disease in her mother.  ROS:   Please see the history of present illness.    *** All other systems reviewed and are negative.  EKGs/Labs/Other Studies Reviewed:    The following studies were reviewed today: ***  EKG:  EKG ***  Recent  Labs: 08/26/2020: B Natriuretic Peptide 237.9 08/30/2020: ALT 15; BUN 13; Creatinine, Ser 0.87; Magnesium 1.8; Potassium 4.0; Sodium 141 09/01/2020: Hemoglobin 9.7; Platelets 353.0  Recent Lipid Panel    Component Value Date/Time   CHOL 205 (H) 01/31/2020 1008   TRIG 70 01/31/2020 1008   HDL 73 01/31/2020 1008   CHOLHDL 2.9 12/03/2019 0952   CHOLHDL 5.2 10/12/2019 0834   VLDL 16 10/12/2019 0834   LDLCALC 119 (H) 01/31/2020 1008   LDLDIRECT 104.1 04/12/2014 1608    Physical Exam:    VS:  There were no vitals taken for this visit.    Wt Readings from Last 3 Encounters:   09/28/20 172 lb 6.4 oz (78.2 kg)  09/12/20 170 lb (77.1 kg)  08/30/20 173 lb (78.5 kg)     GEN: ***. No acute distress HEENT: Normal NECK: No JVD. LYMPHATICS: No lymphadenopathy CARDIAC: *** murmur. RRR *** gallop, or edema. VASCULAR: *** Normal Pulses. No bruits. RESPIRATORY:  Clear to auscultation without rales, wheezing or rhonchi  ABDOMEN: Soft, non-tender, non-distended, No pulsatile mass, MUSCULOSKELETAL: No deformity  SKIN: Warm and dry NEUROLOGIC:  Alert and oriented x 3 PSYCHIATRIC:  Normal affect   ASSESSMENT:    1. Chronic diastolic heart failure ([place])   2. Orthostatic hypotension   3. Type 1 diabetes mellitus with complications ([place])   4. Mixed hyperlipidemia   5. Anemia of chronic disease    PLAN:    In order of problems listed above:  ***   Medication Adjustments/Labs and Tests Ordered: Current medicines are reviewed at length with the patient today.  Concerns regarding medicines are outlined above.  No orders of the defined types were placed in this encounter.  No orders of the defined types were placed in this encounter.   There are no Patient Instructions on file for this visit.   Signed, Sinclair Grooms, MD  03/28/2021 10:54 PM    [facility]

## 2021-03-29 DIAGNOSIS — L981 Factitial dermatitis: Secondary | ICD-10-CM | POA: Diagnosis not present

## 2021-03-29 DIAGNOSIS — L658 Other specified nonscarring hair loss: Secondary | ICD-10-CM | POA: Diagnosis not present

## 2021-03-29 DIAGNOSIS — L2084 Intrinsic (allergic) eczema: Secondary | ICD-10-CM | POA: Diagnosis not present

## 2021-03-30 ENCOUNTER — Ambulatory Visit: Payer: BC Managed Care – PPO | Admitting: Interventional Cardiology

## 2021-03-30 DIAGNOSIS — D638 Anemia in other chronic diseases classified elsewhere: Secondary | ICD-10-CM

## 2021-03-30 DIAGNOSIS — I951 Orthostatic hypotension: Secondary | ICD-10-CM

## 2021-03-30 DIAGNOSIS — E782 Mixed hyperlipidemia: Secondary | ICD-10-CM

## 2021-03-30 DIAGNOSIS — E108 Type 1 diabetes mellitus with unspecified complications: Secondary | ICD-10-CM

## 2021-03-30 DIAGNOSIS — I5032 Chronic diastolic (congestive) heart failure: Secondary | ICD-10-CM

## 2021-04-04 DIAGNOSIS — E1029 Type 1 diabetes mellitus with other diabetic kidney complication: Secondary | ICD-10-CM | POA: Diagnosis not present

## 2021-04-04 DIAGNOSIS — H43822 Vitreomacular adhesion, left eye: Secondary | ICD-10-CM | POA: Diagnosis not present

## 2021-04-04 DIAGNOSIS — E1121 Type 2 diabetes mellitus with diabetic nephropathy: Secondary | ICD-10-CM | POA: Diagnosis not present

## 2021-04-04 DIAGNOSIS — H3582 Retinal ischemia: Secondary | ICD-10-CM | POA: Diagnosis not present

## 2021-04-04 DIAGNOSIS — E113513 Type 2 diabetes mellitus with proliferative diabetic retinopathy with macular edema, bilateral: Secondary | ICD-10-CM | POA: Diagnosis not present

## 2021-04-04 DIAGNOSIS — I10 Essential (primary) hypertension: Secondary | ICD-10-CM | POA: Diagnosis not present

## 2021-04-04 DIAGNOSIS — D509 Iron deficiency anemia, unspecified: Secondary | ICD-10-CM | POA: Diagnosis not present

## 2021-04-18 ENCOUNTER — Other Ambulatory Visit: Payer: Self-pay | Admitting: Nephrology

## 2021-04-18 DIAGNOSIS — R339 Retention of urine, unspecified: Secondary | ICD-10-CM | POA: Diagnosis not present

## 2021-04-18 DIAGNOSIS — N390 Urinary tract infection, site not specified: Secondary | ICD-10-CM | POA: Diagnosis not present

## 2021-04-24 ENCOUNTER — Other Ambulatory Visit: Payer: Self-pay | Admitting: Radiology

## 2021-04-25 NOTE — H&P (Signed)
Chief Complaint: Patient was seen in consultation today for proteinuria  Referring Physician(s): Rosita Fire  Supervising Physician: Sandi Mariscal  Patient Status: [facility] - Out-pt  History of Present Illness: [name] is a 50 y.o. female with past medical history of anemia, depression/anxiety, DM1, neuropathy who presents to [facility] at the request of Dr. Carolin Sicks for kidney biopsy due to proteinuria.   Patient presents today in her usual state of health.  Denies new concerns or complaints.  She has been NPO.  Her son is available today for transportation and care post-procedure.   Past Medical History:  Diagnosis Date   Anemia    Anxiety    Arthritis    Asthma    CHF (congestive heart failure) ([place])    Depression    History of chicken pox    Migraines    Mood disorder (HCC)    anxiety   Prurigo nodularis    with diabetic dermopathy   Type 1 diabetes, uncontrolled, with neuropathy ([place])    [place]    Past Surgical History:  Procedure Laterality Date   BIOPSY  08/11/2020   Procedure: BIOPSY;  Surgeon: Ladene Artist, MD;  Location: [facility];  Service: Endoscopy;;   ESOPHAGOGASTRODUODENOSCOPY (EGD) WITH PROPOFOL N/A 08/11/2020   Procedure: ESOPHAGOGASTRODUODENOSCOPY (EGD) WITH PROPOFOL;  Surgeon: Ladene Artist, MD;  Location: [facility]OS[facility];  Service: Endoscopy;  Laterality: N/A;   treadmill stress test  01/2013   WNL, low risk study    Allergies: Amoxicillin, Atorvastatin, Dulaglutide, Lantus [insulin glargine], and Miconazole nitrate  Medications: Prior to Admission medications   Medication Sig Start Date End Date Taking? Authorizing Provider  albuterol (PROAIR HFA) 108 (90 Base) MCG/ACT inhaler Inhale 2 puffs into the lungs every 6 (six) hours as needed for wheezing or shortness of breath. 07/03/20  Yes [name], PA-C  Carboxymethylcellul-Glycerin (LUBRICATING EYE DROPS OP) Place 1 drop into both eyes daily as needed (dry  eyes).   Yes [provider]  carvedilol (COREG) 12.5 MG tablet Take 0.5 tablets (6.25 mg total) by mouth 2 (two) times daily. 08/26/20  Yes Mercy Riding, MD  DUPIXENT 300 MG/2ML SOPN Inject 300 mg as directed every 14 (fourteen) days. 03/29/21  Yes [provider]  feeding supplement, GLUCERNA SHAKE, (GLUCERNA SHAKE) LIQD Take 237 mLs by mouth 2 (two) times daily between meals. Patient taking differently: Take 237 mLs by mouth daily. 08/16/20  Yes Mariel Aloe, MD  hydrOXYzine (ATARAX/VISTARIL) 25 MG tablet Take 1 tablet (25 mg total) by mouth 3 (three) times daily as needed. Patient taking differently: Take 25 mg by mouth 3 (three) times daily. 02/15/21  Yes Dutch Quint B, FNP  losartan (COZAAR) 25 MG tablet Take 25 mg by mouth at bedtime.   Yes [provider]  NOVOLOG 100 UNIT/ML injection SMARTSIG:0-120 Unit(s) SUB-Q Daily 04/06/20  Yes [provider]  pantoprazole (PROTONIX) 40 MG tablet TAKE 1 TABLET BY MOUTH TWICE A DAY Patient taking differently: Take 40 mg by mouth 2 (two) times daily as needed (acid reflux). 12/07/20  Yes Willia Craze, NP  polyethylene glycol (MIRALAX / GLYCOLAX) 17 g packet Take 17 g by mouth daily as needed.   Yes [provider]  promethazine (PHENERGAN) 12.5 MG tablet Take 1 tablet (12.5 mg total) by mouth every 6 (six) hours as needed for nausea or vomiting. 09/12/20  Yes Willia Craze, NP  tobramycin (TOBREX) 0.3 % ophthalmic solution Place 1 drop into both eyes See admin  instructions. Instill 1-2 drop into both eyes 3 times daily the day before, of, and the day after monthly eye injections 11/17/20  Yes [provider]  torsemide (DEMADEX) 20 MG tablet TAKE 1 TABLET (20 MG TOTAL) BY MOUTH EVERY MONDAY, WEDNESDAY, AND FRIDAY. Patient taking differently: Take 20 mg by mouth 2 (two) times daily. 12/27/20  Yes [name], MD  Continuous Blood Gluc Sensor (FREESTYLE LIBRE 2 SENSOR) MISC CHANGE EVERY 14 DAYS  03/20/20   [provider]  Insulin Disposable Pump (OMNIPOD 10 PACK) MISC by Does not apply route.    [provider]     Family History  Problem Relation Age of Onset   Diabetes Mother        type 2   Hypertension Mother    Thyroid disease Mother    Bipolar disorder Mother    Heart disease Mother    Calcium disorder Mother    Cancer Maternal Grandmother        Breast, stomach   Cancer Paternal Grandmother        stomach, lung (smoker)   Diabetes Paternal Grandmother    Diabetes Paternal Grandfather    Heart failure Sister    Diabetes Sister    Stroke Maternal Aunt    Cancer Maternal Uncle        prostate   CAD Maternal Aunt        stents   Cancer Maternal Aunt 64       ovarian    Social History   Socioeconomic History   Marital status: Legally Separated    Spouse name: Not on file   Number of children: 2   Years of education: Not on file   Highest education level: Not on file  Occupational History   Occupation: Freight forwarder  Tobacco Use   Smoking status: Never   Smokeless tobacco: Never  Vaping Use   Vaping Use: Never used  Substance and Sexual Activity   Alcohol use: Not Currently   Drug use: No   Sexual activity: Yes    Partners: Male    Birth control/protection: None  Other Topics Concern   Not on file  Social History Narrative   Caffeine use: daily   Occupation: cleans houses   Regular exercise: yes, active 5x/wk   Diet: good water, fruits/vegetables daily   Social Determinants of Health   Financial Resource Strain: Not on file  Food Insecurity: Not on file  Transportation Needs: Not on file  Physical Activity: Not on file  Stress: Not on file  Social Connections: Not on file     Review of Systems: A 12 point ROS discussed and pertinent positives are indicated in the HPI above.  All other systems are negative.  Review of Systems  Constitutional:  Negative for fatigue and fever.  Respiratory:  Negative for cough and shortness  of breath.   Cardiovascular:  Negative for chest pain.  Gastrointestinal:  Negative for abdominal pain.  Musculoskeletal:  Negative for back pain.  Psychiatric/Behavioral:  Negative for behavioral problems and confusion.    Vital Signs: BP (!) 156/91   Temp 98.5 F (36.9 C) (Oral)   Resp 16   Ht 6' (1.829 m)   Wt 180 lb (81.6 kg)   LMP 03/02/2021   SpO2 100%   BMI 24.41 kg/m   Physical Exam Vitals and nursing note reviewed.  Constitutional:      General: She is not in acute distress.    Appearance: Normal appearance. She is  not ill-appearing.  HENT:     Mouth/Throat:     Mouth: Mucous membranes are moist.     Pharynx: Oropharynx is clear.  Cardiovascular:     Rate and Rhythm: Normal rate and regular rhythm.  Pulmonary:     Effort: Pulmonary effort is normal. No respiratory distress.     Breath sounds: Normal breath sounds.  Abdominal:     General: Abdomen is flat.     Palpations: Abdomen is soft.  Skin:    General: Skin is warm and dry.  Neurological:     General: No focal deficit present.     Mental Status: She is alert and oriented to person, place, and time. Mental status is at baseline.  Psychiatric:        Mood and Affect: Mood normal.        Behavior: Behavior normal.        Thought Content: Thought content normal.        Judgment: Judgment normal.     MD Evaluation Airway: WNL Heart: WNL Abdomen: WNL Chest/ Lungs: WNL ASA  Classification: 3 Mallampati/Airway Score: Two   Imaging: No results found.  Labs:  CBC: Recent Labs    08/25/20 0817 08/26/20 0047 09/01/20 1051 04/26/21 0650  WBC 4.5 4.9 5.5 5.7  HGB 7.8* 7.6* 9.7* 10.2*  HCT 24.9* 23.7* 28.5* 31.7*  PLT 332 348 353.0 274    COAGS: Recent Labs    04/26/21 0650  INR 0.8    BMP: Recent Labs    08/23/20 1702 08/24/20 1141 08/25/20 0141 08/26/20 0047 08/30/20 1604  NA 142 143 138 140 141  K 3.8 3.4* 3.8 3.6 4.0  CL 106 111 107 106 106  CO2 $Re'27 24 24 26 28  'ynd$ GLUCOSE  229* 113* 122* 183* 98  BUN $Re'11 7 7 7 13  'wrg$ CALCIUM 8.7* 7.3* 8.0* 7.9* 9.0  CREATININE 1.00 0.74 0.97 1.18* 0.87  GFRNONAA >60 >60 >60 56*  --     LIVER FUNCTION TESTS: Recent Labs    08/23/20 1702 08/24/20 1141 08/25/20 0141 08/26/20 0047 08/30/20 1604  BILITOT 0.7 0.3 0.5  --  0.4  AST 9* 8* 15  --  17  ALT $Re'11 8 9  'ZkF$ --  15  ALKPHOS 76 61 66  --  70  PROT 5.8* 4.9* 5.3*  --  6.3  ALBUMIN 2.2* 1.8* 1.9* 1.9* 3.0*    TUMOR MARKERS: No results for input(s): AFPTM, CEA, CA199, CHROMGRNA in the last 8760 hours.  Assessment and Plan: Patient with past medical history of DM1, neuropathy, anemia presents with complaint of proteinuria.  IR consulted for random renal biopsy at the request of Dr. Carolin Sicks. Case reviewed by Dr. Pascal Lux who approves patient for procedure.  Patient presents today in their usual state of health.  She has been NPO and is not currently on blood thinners.  Her BP is elevated today at 175/101.  $RemoveB'10mg'xnfKvKlC$  amlodipine ordered, however not given yet as BP trending down with home med activation.  Continue to monitor for safety to proceed.  CBG elevated at 301.  Patient was instructed to take her pump off this morning-- this is to be replaced.   Risks and benefits of kidney biopsy was discussed with the patient and/or patient's family including, but not limited to bleeding, infection, damage to adjacent structures or low yield requiring additional tests.  All of the questions were answered and there is agreement to proceed.  Consent signed and in chart.   Thank  you for this interesting consult.  I greatly enjoyed meeting [place] and look forward to participating in their care.  A copy of this report was sent to the requesting provider on this date.  Electronically Signed: Docia Barrier, PA 04/26/2021, 7:44 AM   I spent a total of  30 Minutes   in face to face in clinical consultation, greater than 50% of which was counseling/coordinating care  for proteinuria.

## 2021-04-26 ENCOUNTER — Encounter (HOSPITAL_COMMUNITY): Payer: Self-pay

## 2021-04-26 ENCOUNTER — Ambulatory Visit (HOSPITAL_COMMUNITY)
Admission: RE | Admit: 2021-04-26 | Discharge: 2021-04-26 | Disposition: A | Discharge status: Home / Self Care | Payer: BC Managed Care – PPO | Source: Ambulatory Visit | Attending: Nephrology | Admitting: Nephrology

## 2021-04-26 DIAGNOSIS — Z794 Long term (current) use of insulin: Secondary | ICD-10-CM | POA: Insufficient documentation

## 2021-04-26 DIAGNOSIS — F32A Depression, unspecified: Secondary | ICD-10-CM | POA: Diagnosis not present

## 2021-04-26 DIAGNOSIS — Z79899 Other long term (current) drug therapy: Secondary | ICD-10-CM | POA: Diagnosis not present

## 2021-04-26 DIAGNOSIS — E1040 Type 1 diabetes mellitus with diabetic neuropathy, unspecified: Secondary | ICD-10-CM | POA: Insufficient documentation

## 2021-04-26 DIAGNOSIS — R809 Proteinuria, unspecified: Secondary | ICD-10-CM | POA: Insufficient documentation

## 2021-04-26 DIAGNOSIS — F419 Anxiety disorder, unspecified: Secondary | ICD-10-CM | POA: Diagnosis not present

## 2021-04-26 LAB — CBC
HCT: 31.7 % — ABNORMAL LOW (ref 36.0–46.0)
Hemoglobin: 10.2 g/dL — ABNORMAL LOW (ref 12.0–15.0)
MCH: 28.3 pg (ref 26.0–34.0)
MCHC: 32.2 g/dL (ref 30.0–36.0)
MCV: 87.8 fL (ref 80.0–100.0)
Platelets: 274 10*3/uL (ref 150–400)
RBC: 3.61 MIL/uL — ABNORMAL LOW (ref 3.87–5.11)
RDW: 13.5 % (ref 11.5–15.5)
WBC: 5.7 10*3/uL (ref 4.0–10.5)
nRBC: 0 % (ref 0.0–0.2)

## 2021-04-26 LAB — PROTIME-INR
INR: 0.8 (ref 0.8–1.2)
Prothrombin Time: 11.6 seconds (ref 11.4–15.2)

## 2021-04-26 LAB — GLUCOSE, CAPILLARY: Glucose-Capillary: 301 mg/dL — ABNORMAL HIGH (ref 70–99)

## 2021-04-26 MED ORDER — MIDAZOLAM HCL 2 MG/2ML IJ SOLN
INTRAMUSCULAR | Status: DC | PRN
  Administered 2021-04-26 (×2): 1 mg via INTRAVENOUS

## 2021-04-26 MED ORDER — HYDRALAZINE HCL 20 MG/ML IJ SOLN
INTRAMUSCULAR | Status: DC | PRN
  Administered 2021-04-26: 10 mg via INTRAVENOUS

## 2021-04-26 MED ORDER — GELATIN ABSORBABLE 12-7 MM EX MISC
CUTANEOUS | Status: AC
  Filled 2021-04-26: qty 1

## 2021-04-26 MED ORDER — HYDRALAZINE HCL 20 MG/ML IJ SOLN
INTRAMUSCULAR | Status: AC
  Filled 2021-04-26: qty 1

## 2021-04-26 MED ORDER — ACETAMINOPHEN 325 MG PO TABS
650.0000 mg | ORAL_TABLET | Freq: Once | ORAL | Status: AC
  Filled 2021-04-26: qty 2

## 2021-04-26 MED ORDER — SODIUM CHLORIDE 0.9 % IV SOLN: INTRAVENOUS | Status: DC

## 2021-04-26 MED ORDER — ACETAMINOPHEN 325 MG PO TABS
ORAL_TABLET | ORAL | Status: AC
  Administered 2021-04-26: 650 mg
  Filled 2021-04-26: qty 2

## 2021-04-26 MED ORDER — FENTANYL CITRATE (PF) 100 MCG/2ML IJ SOLN
INTRAMUSCULAR | Status: AC
  Filled 2021-04-26: qty 2

## 2021-04-26 MED ORDER — MIDAZOLAM HCL 2 MG/2ML IJ SOLN
INTRAMUSCULAR | Status: AC
  Filled 2021-04-26: qty 2

## 2021-04-26 MED ORDER — LIDOCAINE-EPINEPHRINE 1 %-1:100000 IJ SOLN
INTRAMUSCULAR | Status: AC
  Filled 2021-04-26: qty 1

## 2021-04-26 MED ORDER — FENTANYL CITRATE (PF) 100 MCG/2ML IJ SOLN
INTRAMUSCULAR | Status: DC | PRN
  Administered 2021-04-26: 50 ug via INTRAVENOUS

## 2021-04-26 MED ORDER — AMLODIPINE BESYLATE 10 MG PO TABS: 10.0000 mg | ORAL_TABLET | Freq: Once | ORAL | Status: DC

## 2021-04-26 NOTE — Progress Notes (Signed)
At time of DC, pt denied any pain related to her R leg. Stated her pain was 0/10 after pain interventions.   Pt ambulated without difficulty or bleeding.   Discharged home with her son who will drive and stay with pt x 24 hrs.

## 2021-04-26 NOTE — Progress Notes (Signed)
Pt c/o sharp, shooting, cramp-like pain 4/10 down her right leg. Manuela Schwartz, NP notified and at bedside. RN gave mustard, 650 of tylenol, and applied heat to the right leg. VSS. Will notify NP of any further concerns.

## 2021-04-26 NOTE — Procedures (Signed)
Pre Procedure Dx: Proteinuria Post Procedural Dx: Same  Technically successful US guided biopsy of inferior pole of the left kidney.  EBL: None  No immediate complications.   Jay Elecia Serafin, MD Pager #: [phone]   

## 2021-04-30 ENCOUNTER — Other Ambulatory Visit: Payer: Self-pay | Admitting: Nephrology

## 2021-04-30 DIAGNOSIS — N390 Urinary tract infection, site not specified: Secondary | ICD-10-CM

## 2021-04-30 DIAGNOSIS — R339 Retention of urine, unspecified: Secondary | ICD-10-CM

## 2021-04-30 DIAGNOSIS — R809 Proteinuria, unspecified: Secondary | ICD-10-CM

## 2021-05-01 ENCOUNTER — Ambulatory Visit
Admission: RE | Admit: 2021-05-01 | Discharge: 2021-05-01 | Disposition: A | Discharge status: Home / Self Care | Payer: BC Managed Care – PPO | Source: Ambulatory Visit | Attending: Nephrology | Admitting: Nephrology

## 2021-05-01 DIAGNOSIS — R809 Proteinuria, unspecified: Secondary | ICD-10-CM | POA: Diagnosis not present

## 2021-05-01 DIAGNOSIS — N390 Urinary tract infection, site not specified: Secondary | ICD-10-CM

## 2021-05-01 DIAGNOSIS — R339 Retention of urine, unspecified: Secondary | ICD-10-CM

## 2021-05-03 DIAGNOSIS — I10 Essential (primary) hypertension: Secondary | ICD-10-CM | POA: Diagnosis not present

## 2021-05-03 DIAGNOSIS — Z03818 Encounter for observation for suspected exposure to other biological agents ruled out: Secondary | ICD-10-CM | POA: Diagnosis not present

## 2021-05-03 DIAGNOSIS — Z794 Long term (current) use of insulin: Secondary | ICD-10-CM | POA: Diagnosis not present

## 2021-05-03 DIAGNOSIS — J029 Acute pharyngitis, unspecified: Secondary | ICD-10-CM | POA: Diagnosis not present

## 2021-05-03 DIAGNOSIS — E049 Nontoxic goiter, unspecified: Secondary | ICD-10-CM | POA: Diagnosis not present

## 2021-05-03 DIAGNOSIS — R591 Generalized enlarged lymph nodes: Secondary | ICD-10-CM | POA: Diagnosis not present

## 2021-05-03 DIAGNOSIS — E1140 Type 2 diabetes mellitus with diabetic neuropathy, unspecified: Secondary | ICD-10-CM | POA: Diagnosis not present

## 2021-05-03 DIAGNOSIS — E041 Nontoxic single thyroid nodule: Secondary | ICD-10-CM | POA: Diagnosis not present

## 2021-05-08 ENCOUNTER — Encounter (HOSPITAL_COMMUNITY): Payer: Self-pay

## 2021-05-08 LAB — SURGICAL PATHOLOGY

## 2021-05-14 DIAGNOSIS — N1831 Chronic kidney disease, stage 3a: Secondary | ICD-10-CM | POA: Diagnosis not present

## 2021-05-14 DIAGNOSIS — I10 Essential (primary) hypertension: Secondary | ICD-10-CM | POA: Diagnosis not present

## 2021-05-14 DIAGNOSIS — Z23 Encounter for immunization: Secondary | ICD-10-CM | POA: Diagnosis not present

## 2021-05-14 DIAGNOSIS — E1121 Type 2 diabetes mellitus with diabetic nephropathy: Secondary | ICD-10-CM | POA: Diagnosis not present

## 2021-05-14 DIAGNOSIS — R809 Proteinuria, unspecified: Secondary | ICD-10-CM | POA: Diagnosis not present

## 2021-05-14 DIAGNOSIS — D509 Iron deficiency anemia, unspecified: Secondary | ICD-10-CM | POA: Diagnosis not present

## 2021-05-30 DIAGNOSIS — E113513 Type 2 diabetes mellitus with proliferative diabetic retinopathy with macular edema, bilateral: Secondary | ICD-10-CM | POA: Diagnosis not present

## 2021-05-30 DIAGNOSIS — H43822 Vitreomacular adhesion, left eye: Secondary | ICD-10-CM | POA: Diagnosis not present

## 2021-05-30 DIAGNOSIS — H3582 Retinal ischemia: Secondary | ICD-10-CM | POA: Diagnosis not present

## 2021-07-17 DIAGNOSIS — E113513 Type 2 diabetes mellitus with proliferative diabetic retinopathy with macular edema, bilateral: Secondary | ICD-10-CM | POA: Diagnosis not present

## 2021-09-07 DIAGNOSIS — H43823 Vitreomacular adhesion, bilateral: Secondary | ICD-10-CM | POA: Diagnosis not present

## 2021-09-07 DIAGNOSIS — H35371 Puckering of macula, right eye: Secondary | ICD-10-CM | POA: Diagnosis not present

## 2021-09-07 DIAGNOSIS — H3582 Retinal ischemia: Secondary | ICD-10-CM | POA: Diagnosis not present

## 2021-09-07 DIAGNOSIS — E113513 Type 2 diabetes mellitus with proliferative diabetic retinopathy with macular edema, bilateral: Secondary | ICD-10-CM | POA: Diagnosis not present

## 2021-09-10 ENCOUNTER — Emergency Department (HOSPITAL_BASED_OUTPATIENT_CLINIC_OR_DEPARTMENT_OTHER)
Admission: EM | Admit: 2021-09-10 | Discharge: 2021-09-10 | Disposition: A | Discharge status: Home / Self Care | Payer: BC Managed Care – PPO | Attending: Emergency Medicine | Admitting: Emergency Medicine

## 2021-09-10 ENCOUNTER — Other Ambulatory Visit: Payer: Self-pay

## 2021-09-10 DIAGNOSIS — E11649 Type 2 diabetes mellitus with hypoglycemia without coma: Secondary | ICD-10-CM | POA: Diagnosis not present

## 2021-09-10 DIAGNOSIS — I509 Heart failure, unspecified: Secondary | ICD-10-CM | POA: Insufficient documentation

## 2021-09-10 DIAGNOSIS — E10649 Type 1 diabetes mellitus with hypoglycemia without coma: Secondary | ICD-10-CM | POA: Insufficient documentation

## 2021-09-10 DIAGNOSIS — R7989 Other specified abnormal findings of blood chemistry: Secondary | ICD-10-CM

## 2021-09-10 DIAGNOSIS — J45909 Unspecified asthma, uncomplicated: Secondary | ICD-10-CM | POA: Diagnosis not present

## 2021-09-10 DIAGNOSIS — R944 Abnormal results of kidney function studies: Secondary | ICD-10-CM | POA: Insufficient documentation

## 2021-09-10 DIAGNOSIS — E162 Hypoglycemia, unspecified: Secondary | ICD-10-CM

## 2021-09-10 LAB — BASIC METABOLIC PANEL
Anion gap: 8 (ref 5–15)
Anion gap: 6 (ref 5–15)
BUN: 27 mg/dL — ABNORMAL HIGH (ref 6–20)
BUN: 24 mg/dL — ABNORMAL HIGH (ref 6–20)
CO2: 23 mmol/L (ref 22–32)
CO2: 21 mmol/L — ABNORMAL LOW (ref 22–32)
Calcium: 8.1 mg/dL — ABNORMAL LOW (ref 8.9–10.3)
Calcium: 7.4 mg/dL — ABNORMAL LOW (ref 8.9–10.3)
Chloride: 103 mmol/L (ref 98–111)
Chloride: 108 mmol/L (ref 98–111)
Creatinine, Ser: 1.66 mg/dL — ABNORMAL HIGH (ref 0.44–1.00)
Creatinine, Ser: 1.43 mg/dL — ABNORMAL HIGH (ref 0.44–1.00)
GFR, Estimated: 37 mL/min — ABNORMAL LOW (ref >60)
GFR, Estimated: 44 mL/min — ABNORMAL LOW (ref >60)
Glucose, Bld: 97 mg/dL (ref 70–99)
Glucose, Bld: 114 mg/dL — ABNORMAL HIGH (ref 70–99)
Potassium: 4.1 mmol/L (ref 3.5–5.1)
Potassium: 3.8 mmol/L (ref 3.5–5.1)
Sodium: 134 mmol/L — ABNORMAL LOW (ref 135–145)
Sodium: 135 mmol/L (ref 135–145)

## 2021-09-10 LAB — CBG MONITORING, ED
Glucose-Capillary: 110 mg/dL — ABNORMAL HIGH (ref 70–99)
Glucose-Capillary: 60 mg/dL — ABNORMAL LOW (ref 70–99)
Glucose-Capillary: 90 mg/dL (ref 70–99)
Glucose-Capillary: 105 mg/dL — ABNORMAL HIGH (ref 70–99)

## 2021-09-10 LAB — CBC WITH DIFFERENTIAL/PLATELET
Abs Immature Granulocytes: 0.02 10*3/uL (ref 0.00–0.07)
Basophils Absolute: 0 10*3/uL (ref 0.0–0.1)
Basophils Relative: 0 %
Eosinophils Absolute: 0.1 10*3/uL (ref 0.0–0.5)
Eosinophils Relative: 1 %
HCT: 32.6 % — ABNORMAL LOW (ref 36.0–46.0)
Hemoglobin: 10.8 g/dL — ABNORMAL LOW (ref 12.0–15.0)
Immature Granulocytes: 0 %
Lymphocytes Relative: 32 %
Lymphs Abs: 2.4 10*3/uL (ref 0.7–4.0)
MCH: 27.6 pg (ref 26.0–34.0)
MCHC: 33.1 g/dL (ref 30.0–36.0)
MCV: 83.2 fL (ref 80.0–100.0)
Monocytes Absolute: 0.4 10*3/uL (ref 0.1–1.0)
Monocytes Relative: 5 %
Neutro Abs: 4.6 10*3/uL (ref 1.7–7.7)
Neutrophils Relative %: 62 %
Platelets: 300 10*3/uL (ref 150–400)
RBC: 3.92 MIL/uL (ref 3.87–5.11)
RDW: 13.8 % (ref 11.5–15.5)
WBC: 7.4 10*3/uL (ref 4.0–10.5)
nRBC: 0 % (ref 0.0–0.2)

## 2021-09-10 MED ORDER — SODIUM CHLORIDE 0.9 % IV BOLUS
1000.0000 mL | Freq: Once | INTRAVENOUS | Status: AC
  Administered 2021-09-10: 1000 mL via INTRAVENOUS

## 2021-09-10 MED ORDER — CALCIUM CARBONATE ANTACID 500 MG PO CHEW
1.0000 | CHEWABLE_TABLET | Freq: Once | ORAL | Status: AC
  Administered 2021-09-10: 200 mg via ORAL
  Filled 2021-09-10: qty 1

## 2021-09-10 MED ORDER — ACETAMINOPHEN 325 MG PO TABS
650.0000 mg | ORAL_TABLET | Freq: Once | ORAL | Status: AC
  Administered 2021-09-10: 650 mg via ORAL
  Filled 2021-09-10: qty 2

## 2021-09-10 NOTE — ED Notes (Signed)
Patient given discharge instructions. Questions were answered. Patient verbalized understanding of discharge instructions and care at home.  

## 2021-09-10 NOTE — ED Provider Notes (Signed)
°  Physical Exam  BP (!) 182/113    Pulse 97    Temp 97.9 F (36.6 C) (Oral)    Resp 18    Ht 1.829 m (6')    Wt 77.1 kg    SpO2 100%    BMI 23.06 kg/m   Physical Exam  Procedures  Procedures  ED Course / MDM   Clinical Course as of 09/10/21 1037  Mon Sep 10, 2021  0704 51 yo female with insulin pump replaced last night.  She noted increasing bs last night and gave herself ss sq doses.  This am began going down and cramped. BS and electorlytes pending.  [DR]  0973 Recheck of Bement after IV hydration is significant for improved creatinine with decreased to 1.4 however calcium is low at 7.4. Clinically patient feels improved.  She is taking p.o. without difficulty. [DR]    Clinical Course User Index [DR] Pattricia Boss, MD   Medical Decision Making Amount and/or Complexity of Data Reviewed Labs: ordered.  Risk OTC drugs.   51 yo female with omnipod insulinpump  in place, she has been using this fro 1/1/2 years.  She was hospitalized one year ago when pod was not giving insulin properly.  She changed out pump last night and note bs going up- bs highest at high (>400) then decreased to 396 after giving ss, then 197, then felt like pump was working again.  Then decreased to 57 and 68 and drank propel water. Then ate popsicle. Last bs before coming to hospital 93 after 2-3 am.  She gave herself the last ss insulin 1230-1 all short acting insulin. Patient has been not eating and drinking as well in the past month.  Patient has not had pods due to problems with insurance.She has had a poor appetite and thirst has varied.  Discussed results with patient and significant other. She has recently had a kidney biopsy which she was told was normal. Her last creatinine that I reviewed was 1.8 and today it is 1.6. She reports these decreases in p.o. intake as noted above. She has received 1 L of fluid here.  She has been given an additional 500 cc. Plan recheck of chemistry after 500 cc  bolus. We have also discussed the necessity of her having her creatinine and labs rechecked by her primary care doctor.  BS 60 and po being given  It appears at this time, the patient's short acting insulin has worn off.  She is improved hydration with IV fluids.  She is taking p.o. without difficulty.  We have discussed the electrolyte abnormalities with and the elevated creatinine.  She is aware that she should have her creatinine rechecked this week.  Given that she is now taking p.o. without difficulty and cramping has resolved we will allow normal stasis to evolve patient is aware she should also have calcium rechecked.  P.o. calcium ordered here in the ED and patient advised regarding taking p.o. at home She should return for any signs of hypoglycemia after treatment at home per her usual algorithm. She is advised to call her primary care physician today for recheck the next 1 to 3 days.       Pattricia Boss, MD 09/10/21 1040

## 2021-09-10 NOTE — Discharge Instructions (Addendum)
Please continue your usual pump at home and close monitoring of blood sugars.   Please take over-the-counter calcium Call your doctor today for recheck of your calcium and creatinine later this week.

## 2021-09-10 NOTE — ED Notes (Signed)
Informed Shyniece - RN of pt's BP and pain level.

## 2021-09-10 NOTE — ED Triage Notes (Signed)
Blood sugar readings of 196 - high on home monitor. Pt reports having difficulties with insulin pump/sensor since 2000 last night and reports intermittent muscle cramps over night, and headache currently. Hypertensive, denies hx of hypertension, otherwise VSS, ambulatory, A&Ox3.   CBG 110 in triage.

## 2021-09-10 NOTE — ED Notes (Signed)
Unsuccessful IV attempts by this RN, 2 with ultrasound. Pt requesting IV team to be called, explained that we don't have that resource at this facility.  Agricultural consultant notified.

## 2021-09-10 NOTE — ED Provider Notes (Signed)
[facility] EMERGENCY DEPT Provider Note   CSN: [id number] Arrival date & time: 09/10/21  3710     History  Chief Complaint  Patient presents with   Hyperglycemia   Spasms    [name] is a 51 y.o. female.  HPI     This is a 51 year old with history of type 1 diabetes, CHF, asthma who presents with muscle pains and difficulty controlling her blood sugar.  Patient has an insulin pump.  She states that she changed the Depo out last night.  Since that time she felt like she was not getting insulin administered.  She did not have any way to know for sure except for her blood sugars were beginning to climb.  She self administered 2 separate doses of insulin subcutaneously.  She states she laid down for a bit and woke up with "severe spasming of my thighs and hands."  She states that it was bilaterally.  At that time she states that her blood sugar was over 300.  She normally has very well-controlled blood sugars.  She took another subcutaneous dose of insulin.  Since that time she has felt shaky and fatigued.  She continues to have some spasming of the legs.  She states that she has also noted that her blood sugar has begun to drop more precipitously.  It went from 196 to the 60s.  She drank some propel energy water.  Upon arrival here, blood sugar is 110.  She denies chest pain, shortness of breath, recent fevers or illnesses.  Home Medications Prior to Admission medications   Medication Sig Start Date End Date Taking? Authorizing Provider  albuterol (PROAIR HFA) 108 (90 Base) MCG/ACT inhaler Inhale 2 puffs into the lungs every 6 (six) hours as needed for wheezing or shortness of breath. 07/03/20   [name], PA-C  Carboxymethylcellul-Glycerin (LUBRICATING EYE DROPS OP) Place 1 drop into both eyes daily as needed (dry eyes).    [provider]  carvedilol (COREG) 12.5 MG tablet Take 0.5 tablets (6.25 mg total) by mouth 2 (two) times daily. 08/26/20    Mercy Riding, MD  Continuous Blood Gluc Sensor (FREESTYLE LIBRE 2 SENSOR) MISC CHANGE EVERY 14 DAYS 03/20/20   [provider]  DUPIXENT 300 MG/2ML SOPN Inject 300 mg as directed every 14 (fourteen) days. 03/29/21   [provider]  feeding supplement, GLUCERNA SHAKE, (GLUCERNA SHAKE) LIQD Take 237 mLs by mouth 2 (two) times daily between meals. Patient taking differently: Take 237 mLs by mouth daily. 08/16/20   Mariel Aloe, MD  hydrOXYzine (ATARAX/VISTARIL) 25 MG tablet Take 1 tablet (25 mg total) by mouth 3 (three) times daily as needed. Patient taking differently: Take 25 mg by mouth 3 (three) times daily. 02/15/21   Dutch Quint B, FNP  Insulin Disposable Pump (OMNIPOD 10 PACK) MISC by Does not apply route.    [provider]  losartan (COZAAR) 25 MG tablet Take 25 mg by mouth at bedtime.    [provider]  NOVOLOG 100 UNIT/ML injection SMARTSIG:0-120 Unit(s) SUB-Q Daily 04/06/20   [provider]  pantoprazole (PROTONIX) 40 MG tablet TAKE 1 TABLET BY MOUTH TWICE A DAY Patient taking differently: Take 40 mg by mouth 2 (two) times daily as needed (acid reflux). 12/07/20   Willia Craze, NP  polyethylene glycol (MIRALAX / GLYCOLAX) 17 g packet Take 17 g by mouth daily as needed.    [provider]  promethazine (PHENERGAN) 12.5 MG tablet Take 1 tablet (  12.5 mg total) by mouth every 6 (six) hours as needed for nausea or vomiting. 09/12/20   Willia Craze, NP  tobramycin (TOBREX) 0.3 % ophthalmic solution Place 1 drop into both eyes See admin instructions. Instill 1-2 drop into both eyes 3 times daily the day before, of, and the day after monthly eye injections 11/17/20   [provider]  torsemide (DEMADEX) 20 MG tablet TAKE 1 TABLET (20 MG TOTAL) BY MOUTH EVERY MONDAY, WEDNESDAY, AND FRIDAY. Patient taking differently: Take 20 mg by mouth 2 (two) times daily. 12/27/20   [name], MD      Allergies    Amoxicillin,  Atorvastatin, Dulaglutide, Lantus [insulin glargine], and Miconazole nitrate    Review of Systems   Review of Systems  Constitutional:  Negative for fever.  Respiratory:  Negative for shortness of breath.   Cardiovascular:  Negative for chest pain.  Musculoskeletal:  Positive for myalgias.  All other systems reviewed and are negative.  Physical Exam Updated Vital Signs BP (!) 182/113    Pulse 97    Temp 97.9 F (36.6 C) (Oral)    Resp 18    Ht 1.829 m (6')    Wt 77.1 kg    SpO2 100%    BMI 23.06 kg/m  Physical Exam Vitals and nursing note reviewed.  Constitutional:      Appearance: She is well-developed. She is not ill-appearing.  HENT:     Head: Normocephalic and atraumatic.     Nose: Nose normal.     Mouth/Throat:     Mouth: Mucous membranes are moist.  Eyes:     Pupils: Pupils are equal, round, and reactive to light.  Cardiovascular:     Rate and Rhythm: Normal rate and regular rhythm.     Heart sounds: Normal heart sounds.  Pulmonary:     Effort: Pulmonary effort is normal. No respiratory distress.     Breath sounds: No wheezing.  Abdominal:     Palpations: Abdomen is soft.  Musculoskeletal:     Cervical back: Neck supple.     Comments: Insulin pump medial aspect of left proximal arm, insulin sensor more lateral aspect of the left proximal arm  Skin:    General: Skin is warm and dry.  Neurological:     Mental Status: She is alert and oriented to person, place, and time.  Psychiatric:     Comments: Anxious appearing    ED Results / Procedures / Treatments   Labs (all labs ordered are listed, but only abnormal results are displayed) Labs Reviewed  CBG MONITORING, ED - Abnormal; Notable for the following components:      Result Value   Glucose-Capillary 110 (*)    All other components within normal limits  CBC WITH DIFFERENTIAL/PLATELET  BASIC METABOLIC PANEL    EKG None  Radiology No results found.  Procedures Procedures    Medications Ordered in  ED Medications  sodium chloride 0.9 % bolus 1,000 mL (has no administration in time range)    ED Course/ Medical Decision Making/ A&P Clinical Course as of 09/10/21 2317  Mon Sep 10, 2021  0704 51 yo female with insulin pump replaced last night.  She noted increasing bs last night and gave herself ss sq doses.  This am began going down and cramped. BS and electorlytes pending.  [DR]  0347 Recheck of Bement after IV hydration is significant for improved creatinine with decreased to 1.4 however calcium is low at 7.4. Clinically patient feels  improved.  She is taking p.o. without difficulty. [DR]    Clinical Course User Index [DR] Pattricia Boss, MD                           Medical Decision Making Amount and/or Complexity of Data Reviewed Labs: ordered.  Risk OTC drugs.   This patient presents to the ED for concern of muscle spasm, insulin indiscretion, this involves an extensive number of treatment options, and is a complaint that carries with it a high risk of complications and morbidity.  The differential diagnosis includes malfunctioning insulin pump, metabolic derangement such as potassium or calcium derangement, kidney failure, dehydration  MDM:    This is a 51 year old female with history of diabetes who presents with fluctuating blood sugars and muscle cramping.  She is nontoxic and vital signs are reassuring.  Physical exam is fairly reassuring.  It is unclear whether her pump is functioning correctly.  She states there is no way for her to really check except for monitoring her blood sugars.  She believes she is dehydrated.  Given the description of cramping and her hands cramping, suspect she may have a calcium derangement as well.  Labs ordered.  Patient given fluids.  Labs pending at time of signout. (Labs, imaging)  Labs: I Ordered, and personally interpreted labs.  The pertinent results include: Pending  Imaging Studies ordered: I ordered imaging studies including  N/A I independently visualized and interpreted imaging. I agree with the radiologist interpretation  Additional history obtained from patient.  External records from outside source obtained and reviewed including prior visits  Critical Interventions: IV normal saline  Consultations: I requested consultation with the N/A,  and discussed lab and imaging findings as well as pertinent plan - they recommend: N/A  Cardiac Monitoring: The patient was maintained on a cardiac monitor.  I personally viewed and interpreted the cardiac monitored which showed an underlying rhythm of: NSR  Reevaluation: After the interventions noted above, I reevaluated the patient and found that they have :stayed the same   Considered admission for: Disposition pending  Social Determinants of Health: Lives independently  Disposition: Discharge  Co morbidities that complicate the patient evaluation  Past Medical History:  Diagnosis Date   Anemia    Anxiety    Arthritis    Asthma    CHF (congestive heart failure) (HCC)    Depression    History of chicken pox    Migraines    Mood disorder (HCC)    anxiety   Prurigo nodularis    with diabetic dermopathy   Type 1 diabetes, uncontrolled, with neuropathy ([place])    Phadke     Medicines Meds ordered this encounter  Medications   sodium chloride 0.9 % bolus 1,000 mL   acetaminophen (TYLENOL) tablet 650 mg   calcium carbonate (TUMS - dosed in mg elemental calcium) chewable tablet 200 mg of elemental calcium    I have reviewed the patients home medicines and have made adjustments as needed  Problem List / ED Course: Problem List Items Addressed This Visit   None Visit Diagnoses     Hypoglycemia    -  Primary   Hypocalcemia       Elevated serum creatinine                       Final Clinical Impression(s) / ED Diagnoses Final diagnoses:  None    Rx / DC Orders ED  Discharge Orders     None         Merryl Hacker,  MD 09/10/21 2321

## 2021-09-10 NOTE — ED Notes (Signed)
Pt given crackers and gatorade at this time.

## 2021-10-01 DIAGNOSIS — N179 Acute kidney failure, unspecified: Secondary | ICD-10-CM | POA: Diagnosis not present

## 2021-10-01 DIAGNOSIS — E049 Nontoxic goiter, unspecified: Secondary | ICD-10-CM | POA: Diagnosis not present

## 2021-10-01 DIAGNOSIS — Z794 Long term (current) use of insulin: Secondary | ICD-10-CM | POA: Diagnosis not present

## 2021-10-01 DIAGNOSIS — E1140 Type 2 diabetes mellitus with diabetic neuropathy, unspecified: Secondary | ICD-10-CM | POA: Diagnosis not present

## 2021-10-01 DIAGNOSIS — E041 Nontoxic single thyroid nodule: Secondary | ICD-10-CM | POA: Diagnosis not present

## 2021-10-12 ENCOUNTER — Other Ambulatory Visit: Payer: Self-pay | Admitting: Physician Assistant

## 2021-10-12 DIAGNOSIS — Z1231 Encounter for screening mammogram for malignant neoplasm of breast: Secondary | ICD-10-CM

## 2021-10-26 ENCOUNTER — Inpatient Hospital Stay: Admission: RE | Admit: 2021-10-26 | Payer: BC Managed Care – PPO | Source: Ambulatory Visit

## 2021-11-02 DIAGNOSIS — E113513 Type 2 diabetes mellitus with proliferative diabetic retinopathy with macular edema, bilateral: Secondary | ICD-10-CM | POA: Diagnosis not present

## 2021-11-02 DIAGNOSIS — H43823 Vitreomacular adhesion, bilateral: Secondary | ICD-10-CM | POA: Diagnosis not present

## 2021-11-02 DIAGNOSIS — H35371 Puckering of macula, right eye: Secondary | ICD-10-CM | POA: Diagnosis not present

## 2021-11-12 ENCOUNTER — Telehealth: Payer: Self-pay | Admitting: Interventional Cardiology

## 2021-11-12 DIAGNOSIS — M7989 Other specified soft tissue disorders: Secondary | ICD-10-CM

## 2021-11-12 DIAGNOSIS — I5032 Chronic diastolic (congestive) heart failure: Secondary | ICD-10-CM

## 2021-11-12 NOTE — Telephone Encounter (Signed)
Spoke with Dr. Tamala Julian and he would like for pt to have LEV doppler performed and DDimer and Pro BNP.  Spoke with pt and made her aware.  Advised someone from the office would contact her to get doppler scheduled.  Advised pt to go by lab at [facility] to have labs drawn and if any troubles, let me know and we can draw labs here.  Pt in agreement with plan.  ?

## 2021-11-12 NOTE — Telephone Encounter (Signed)
Spoke with pt and she has swelling in her right leg.  Denies heat, pain or redness to the leg.  It is starting to show signs of the possibility of weeping again but not currently blistered.  Gets SOB easily with exertion.  Denies CP.  Currently takes Torsemide $RemoveBeforeD'20mg'[id number]$  BID but swelling is not improving.  Pt is up on her feet most of the day working.  Has tried to wear compression stockings but then swelling moves into thigh and abd.  Scheduled pt to see DOD on Wednesday.  Advised I will send message to Dr. Tamala Julian to see if he has any further recommendations in the meantime.   ?

## 2021-11-12 NOTE — Telephone Encounter (Signed)
Patient's friend called in on behalf of the patient, but she states the call may be returned to the patient ? ?Pt c/o swelling: STAT is pt has developed SOB within 24 hours ? ?How much weight have you gained and in what time span?  ?Unsure  ? ?If swelling, where is the swelling located?  ?Right leg ? ?Are you currently taking a fluid pill?  ?Yes, but per patient's friend, patient states the fluid pill is no longer working  ? ?Are you currently SOB?  ?Yes, Tiffany states the patient just messaged her stating she is SOB, but she is not currently with the patient ? ?Do you have a log of your daily weights (if so, list)?  ?No  ? ?Have you gained 3 pounds in a day or 5 pounds in a week?  ?No  ? ?Have you traveled recently?  ? ? ?

## 2021-11-14 ENCOUNTER — Encounter: Payer: Self-pay | Admitting: Cardiovascular Disease

## 2021-11-14 ENCOUNTER — Ambulatory Visit (HOSPITAL_COMMUNITY)
Admission: RE | Admit: 2021-11-14 | Discharge: 2021-11-14 | Disposition: A | Discharge status: Home / Self Care | Payer: BC Managed Care – PPO | Source: Ambulatory Visit | Attending: Cardiology | Admitting: Cardiology

## 2021-11-14 ENCOUNTER — Ambulatory Visit (INDEPENDENT_AMBULATORY_CARE_PROVIDER_SITE_OTHER): Payer: BC Managed Care – PPO | Admitting: Cardiovascular Disease

## 2021-11-14 VITALS — BP 148/100 | HR 95 | Ht 72.0 in | Wt 189.4 lb

## 2021-11-14 DIAGNOSIS — I951 Orthostatic hypotension: Secondary | ICD-10-CM | POA: Diagnosis not present

## 2021-11-14 DIAGNOSIS — R5383 Other fatigue: Secondary | ICD-10-CM | POA: Diagnosis not present

## 2021-11-14 DIAGNOSIS — M7989 Other specified soft tissue disorders: Secondary | ICD-10-CM

## 2021-11-14 DIAGNOSIS — I5032 Chronic diastolic (congestive) heart failure: Secondary | ICD-10-CM | POA: Diagnosis not present

## 2021-11-14 LAB — BASIC METABOLIC PANEL
BUN/Creatinine Ratio: 12 (ref 9–23)
BUN: 21 mg/dL (ref 6–24)
CO2: 20 mmol/L (ref 20–29)
Calcium: 8.2 mg/dL — ABNORMAL LOW (ref 8.7–10.2)
Chloride: 107 mmol/L — ABNORMAL HIGH (ref 96–106)
Creatinine, Ser: 1.76 mg/dL — ABNORMAL HIGH (ref 0.57–1.00)
Glucose: 87 mg/dL (ref 70–99)
Potassium: 5 mmol/L (ref 3.5–5.2)
Sodium: 139 mmol/L (ref 134–144)
eGFR: 35 mL/min/{1.73_m2} — ABNORMAL LOW (ref >59)

## 2021-11-14 LAB — CBC
Hematocrit: 31.2 % — ABNORMAL LOW (ref 34.0–46.6)
Hemoglobin: 10.1 g/dL — ABNORMAL LOW (ref 11.1–15.9)
MCH: 27.9 pg (ref 26.6–33.0)
MCHC: 32.4 g/dL (ref 31.5–35.7)
MCV: 86 fL (ref 79–97)
Platelets: 328 10*3/uL (ref 150–450)
RBC: 3.62 x10E6/uL — ABNORMAL LOW (ref 3.77–5.28)
RDW: 14 % (ref 11.7–15.4)
WBC: 5.6 10*3/uL (ref 3.4–10.8)

## 2021-11-14 LAB — TSH: TSH: 1.68 u[IU]/mL (ref 0.450–4.500)

## 2021-11-14 NOTE — Patient Instructions (Signed)
Medication Instructions:  ?Your physician has recommended you make the following change in your medication:  ? ?1) STOP Losartan ?2) STOP Protonix ?3) STOP Torsemide ? ?*If you need a refill on your cardiac medications before your next appointment, please call your pharmacy* ? ?Lab Work: ?TODAY: TSH, CBC, BMP ?If you have labs (blood work) drawn today and your tests are completely normal, you will receive your results only by: ?MyChart Message (if you have MyChart) OR ?A paper copy in the mail ?If you have any lab test that is abnormal or we need to change your treatment, we will call you to review the results. ? ?Testing/Procedures: ?NONE ? ?Follow-Up: ?At [facility], you and your health needs are our priority.  As part of our continuing mission to provide you with exceptional heart care, we have created designated Provider Care Teams.  These Care Teams include your primary Cardiologist (physician) and Advanced Practice Providers (APPs -  Physician Assistants and Nurse Practitioners) who all work together to provide you with the care you need, when you need it. ? ?Your next appointment:   ?1 month(s) ? ?The format for your next appointment:   ?In Person ? ?Provider:   ?Sinclair Grooms, MD { ? ?Other Instructions ? ?Important Information About Sugar ? ? ? ? ?  ? ?For your  leg edema you  should do  the following ?1. Leg elevation - I recommend the Lounge Dr. Leg rest.  See below for details  ?2. Salt restriction  -  Use potassium chloride instead of regular salt as a salt substitute. ?3. Walk regularly ?4. Compression hose - [employer] [facility] -thigh high  ?5. Weight loss  ? ? ?Available on [email or web address] ?Or  ?Go to [email or web address] ? ? ? ? ?

## 2021-11-14 NOTE — Progress Notes (Signed)
?Cardiology Office Note:   ? ?Date:  11/14/2021  ? ?ID:  [name], DOB [birth year], MRN [id number] ? ?PCP:  Megan Roles, PA ?  ?[facility] Providers ?Cardiologist:  Megan Grooms, MD { ?  ? ? ? ? ?Referring MD: Megan Roles, PA  ? ?Chief Complaint  ?Patient presents with  ? Leg Swelling  ? ? ?History of Present Illness:   ? ?[name] is a 51 y.o. female with a hx of diastolic CHF, DM1, orthostasis  ? ? ?[name] is a 51 year old female with a history of acute on chronic diastolic congestive heart failure, hypertension, type 1 diabetes mellitus, peripheral neuropathy and dysautonomia, hypertension.  She is seen today as a DOD work in visit for worsening leg swelling. ? ?Has isolated R lower leg ?When she wears compression hose, the swelling will be up into her abdomen  ? ?Eats salty foods several times a week, ?Eats grilled chicken on salad  ?Stands all day ( works at the [employer] )  ?8-10 hour days  ? ?Has a venous duplex scheduled today at 1:00 PM  ? ?Does not walk much,  is too tired at the end of the day  ?Has dysautonomia - gets dizzy through the day  ? ?Has not been taking her Losartan ( orthostasis) ?Not taking Losartan, demadex due to orthostasis  ? ?Is having some bleeding into her R retina  ? ?Past Medical History:  ?Diagnosis Date  ? Anemia   ? Anxiety   ? Arthritis   ? Asthma   ? CHF (congestive heart failure) ([place])   ? Depression   ? History of chicken pox   ? Migraines   ? Mood disorder ([place])   ? anxiety  ? 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 Type 1 diabetes, uncontrolled, with neuropathy   ? Phadke  ? ? ?Past Surgical History:  ?Procedure Laterality Date  ? BIOPSY  08/11/2020  ? Procedure: BIOPSY;  Surgeon: Ladene Artist, MD;  Location: [facility];  Service: Endoscopy;;  ? ESOPHAGOGASTRODUODENOSCOPY (EGD) WITH PROPOFOL N/A 08/11/2020  ? Procedure: ESOPHAGOGASTRODUODENOSCOPY (EGD) WITH PROPOFOL;  Surgeon: Ladene Artist, MD;  Location: [facility];   Service: Endoscopy;  Laterality: N/A;  ? treadmill stress test  01/2013  ? WNL, low risk study  ? ? ?Current Medications: ?Current Meds  ?Medication Sig  ? albuterol (PROAIR HFA) 108 (90 Base) MCG/ACT inhaler Inhale 2 puffs into the lungs every 6 (six) hours as needed for wheezing or shortness of breath.  ? Carboxymethylcellul-Glycerin (LUBRICATING EYE DROPS OP) Place 1 drop into both eyes daily as needed (dry eyes).  ? carvedilol (COREG) 12.5 MG tablet Take 0.5 tablets (6.25 mg total) by mouth 2 (two) times daily.  ? Continuous Blood Gluc Sensor (FREESTYLE LIBRE 2 SENSOR) MISC CHANGE EVERY 14 DAYS  ? feeding supplement, GLUCERNA SHAKE, (GLUCERNA SHAKE) LIQD Take 237 mLs by mouth 2 (two) times daily between meals.  ? hydrOXYzine (ATARAX) 50 MG tablet Take 50 mg by mouth 3 (three) times daily.  ? Insulin Disposable Pump (OMNIPOD 10 PACK) MISC by Does not apply route.  ? NOVOLOG 100 UNIT/ML injection SMARTSIG:0-120 Unit(s) SUB-Q Daily  ? polyethylene glycol (MIRALAX / GLYCOLAX) 17 g packet Take 17 g by mouth daily as needed.  ? torsemide (DEMADEX) 20 MG tablet Take 20 mg by mouth 2 (two) times daily.  ? TRESIBA FLEXTOUCH 200 UNIT/ML FlexTouch Pen Inject into the skin. Inject into the skin.  ?  ? ?  Allergies:   Amoxicillin, Atorvastatin, Dulaglutide, Lantus [insulin glargine], and Miconazole nitrate  ? ?Social History  ? ?Socioeconomic History  ? Marital status: Divorced  ?  Spouse name: Not on file  ? Number of children: 2  ? Years of education: Not on file  ? Highest education level: Not on file  ?Occupational History  ? Occupation: Freight forwarder  ?Tobacco Use  ? Smoking status: Never  ? Smokeless tobacco: Never  ?Vaping Use  ? Vaping Use: Never used  ?Substance and Sexual Activity  ? Alcohol use: Not Currently  ? Drug use: No  ? Sexual activity: Yes  ?  Partners: Male  ?  Birth control/protection: None  ?Other Topics Concern  ? Not on file  ?Social History Narrative  ? Caffeine use: daily  ? Occupation: cleans houses  ?  Regular exercise: yes, active 5x/wk  ? Diet: good water, fruits/vegetables daily  ? ?Social Determinants of Health  ? ?Financial Resource Strain: Not on file  ?Food Insecurity: Not on file  ?Transportation Needs: Not on file  ?Physical Activity: Not on file  ?Stress: Not on file  ?Social Connections: Not on file  ?  ? ?Family History: ?The patient's family history includes Bipolar disorder in her mother; CAD in her maternal aunt; Calcium disorder in her mother; Cancer in her maternal grandmother, maternal uncle, and paternal grandmother; Cancer (age of onset: 28) in her maternal aunt; Diabetes in her mother, paternal grandfather, paternal grandmother, and sister; Heart disease in her mother; Heart failure in her sister; Hypertension in her mother; Stroke in her maternal aunt; Thyroid disease in her mother. ? ?ROS:   ?Please see the history of present illness.    ? All other systems reviewed and are negative. ? ?EKGs/Labs/Other Studies Reviewed:   ? ?The following studies were reviewed today: ? ? ?EKG:  E November 28, 2021: Normal sinus rhythm at 95.  T wave inversion in the inferior leads and also the lateral leads.  There is no significant changes from her previous EKG.  The lateral T wave inversion may be a little bit more pronounced. ? ?Recent Labs: ?Nov 28, 2021: BUN WILL FOLLOW; Creatinine, Ser 1.76; Hemoglobin 10.1; Platelets 328; Potassium 5.0; Sodium 139; TSH 1.680  ?Recent Lipid Panel ?   ?Component Value Date/Time  ? CHOL 205 (H) 01/31/2020 1008  ? TRIG 70 01/31/2020 1008  ? HDL 73 01/31/2020 1008  ? CHOLHDL 2.9 12/03/2019 0952  ? CHOLHDL 5.2 10/12/2019 0834  ? VLDL 16 10/12/2019 0834  ? LDLCALC 119 (H) 01/31/2020 1008  ? LDLDIRECT 104.1 04/12/2014 1608  ? ? ? ?Risk Assessment/Calculations:   ?  ? ?    ? ?Physical Exam:   ? ?VS:  BP (!) 148/100 (BP Location: Right Arm, Patient Position: Sitting, Cuff Size: Normal)   Pulse 95   Ht 6' (1.829 m)   Wt 189 lb 6.4 oz (85.9 kg)   SpO2 99%   BMI 25.69 kg/m?     ? ?Wt Readings from Last 3 Encounters:  ?November 28, 2021 189 lb 6.4 oz (85.9 kg)  ?09/10/21 170 lb (77.1 kg)  ?04/26/21 180 lb (81.6 kg)  ?  ? ?GEN:  Well nourished, well developed in no acute distress ?HEENT: Normal ?NECK: No JVD; No carotid bruits ?LYMPHATICS: No lymphadenopathy ?CARDIAC: RRR, no murmurs, rubs, gallops ?RESPIRATORY:  Clear to auscultation without rales, wheezing or rhonchi  ?ABDOMEN: Soft, non-tender, non-distended ?MUSCULOSKELETAL:  R leg 3+ edema,  very tense,  edema is from the knee down.  Left leg  has trace - 1+ edema  ?SKIN: Warm and dry ?NEUROLOGIC:  Alert and oriented x 3 ?PSYCHIATRIC:  Normal affect  ? ?ASSESSMENT:   ? ?1. Leg swelling   ?2. Orthostatic hypotension   ?3. Chronic diastolic heart failure ([place])   ?4. Fatigue, unspecified type   ? ?PLAN:   ? ?In order of problems listed above: ? ?Right leg edema: High presents for further evaluation of unilateral right leg edema.  She has a history of acute on chronic diastolic dysfunction in the past.  She also has some degree of chronic kidney disease.  Her job involves standing all day long. ?She has severe orthostatic hypotension so she is not able to take torsemide on any regular basis.  I encouraged her to wear a thigh-high compression hose.  She should try the torsemide intermittently. ? ?I have given her instructions on how to order a lounge doctor leg rest which I think will help with her edema.  She has an appointment in our vascular lab this afternoon to look for DVT. ? ?It is quite possible that she has venous insufficiency.  She may benefit from a vascular surgery consultation  ? ?Addendum: ? ? ?  At the end of the day,  the lower extremity venous duplex study show extensive pelvic lymphadenopathy. ? ?I suspect she has a venous compression syndrome ?I have discussed with Dr. Tamala Julian.  I think she needs a CT with contrast of her chest, abdomen, pelvis . ? ? ? ?   ? ? ?Medication Adjustments/Labs and Tests Ordered: ?Current medicines are  reviewed at length with the patient today.  Concerns regarding medicines are outlined above.  ?Orders Placed This Encounter  ?Procedures  ? CBC  ? TSH  ? Basic metabolic panel  ? EKG 12-Lead  ? ?No orders of the defined ty

## 2021-11-15 DIAGNOSIS — H43823 Vitreomacular adhesion, bilateral: Secondary | ICD-10-CM | POA: Diagnosis not present

## 2021-11-15 DIAGNOSIS — H35371 Puckering of macula, right eye: Secondary | ICD-10-CM | POA: Diagnosis not present

## 2021-11-15 DIAGNOSIS — E113513 Type 2 diabetes mellitus with proliferative diabetic retinopathy with macular edema, bilateral: Secondary | ICD-10-CM | POA: Diagnosis not present

## 2021-11-15 DIAGNOSIS — H4311 Vitreous hemorrhage, right eye: Secondary | ICD-10-CM | POA: Diagnosis not present

## 2021-11-16 ENCOUNTER — Inpatient Hospital Stay: Admission: RE | Admit: 2021-11-16 | Payer: BC Managed Care – PPO | Source: Ambulatory Visit

## 2021-11-19 DIAGNOSIS — I10 Essential (primary) hypertension: Secondary | ICD-10-CM | POA: Diagnosis not present

## 2021-11-19 DIAGNOSIS — R609 Edema, unspecified: Secondary | ICD-10-CM | POA: Diagnosis not present

## 2021-11-19 DIAGNOSIS — E1165 Type 2 diabetes mellitus with hyperglycemia: Secondary | ICD-10-CM | POA: Diagnosis not present

## 2021-11-19 DIAGNOSIS — R590 Localized enlarged lymph nodes: Secondary | ICD-10-CM | POA: Diagnosis not present

## 2021-11-19 DIAGNOSIS — I5032 Chronic diastolic (congestive) heart failure: Secondary | ICD-10-CM | POA: Diagnosis not present

## 2021-11-20 DIAGNOSIS — R590 Localized enlarged lymph nodes: Secondary | ICD-10-CM | POA: Diagnosis not present

## 2021-11-20 DIAGNOSIS — J9811 Atelectasis: Secondary | ICD-10-CM | POA: Diagnosis not present

## 2021-11-20 DIAGNOSIS — I5032 Chronic diastolic (congestive) heart failure: Secondary | ICD-10-CM | POA: Diagnosis not present

## 2021-11-20 DIAGNOSIS — J8489 Other specified interstitial pulmonary diseases: Secondary | ICD-10-CM | POA: Diagnosis not present

## 2021-11-20 DIAGNOSIS — J90 Pleural effusion, not elsewhere classified: Secondary | ICD-10-CM | POA: Diagnosis not present

## 2021-11-26 NOTE — Progress Notes (Signed)
?Cardiology Office Note:   ? ?Date:  11/28/2021  ? ?ID:  [name], DOB 04-11-1971, MRN [id number] ? ?PCP:  [name], PA  ?Cardiologist:  Sinclair Grooms, MD  ? ?Referring MD: [name], PA-C  ? ?Chief Complaint  ?Patient presents with  ? Congestive Heart Failure  ? Follow-up  ?  Orthostatic hypotension  ? ? ?History of Present Illness:   ? ?[name] is a 51 y.o. female with a hx of acute on chronic diastolic heart failure, essential hypertension, type 1 diabetes mellitus, migraine headaches, autonomic and peripheral neuropathy, and hypotension on Imdur/heart failure therapy associated with syncope. Recent worsening of LEE with finding of inguinal adenopathy. ?  ? ? ?Major issue now is orthopnea and dyspnea on exertion.  I suspect she has significant diastolic heart failure and there is also mild reduction in systolic dysfunction.  She has not been able to tolerate therapies very well due to significant orthostasis that occurs and leads to fainting.  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  She is currently taking carvedilol 12.5 mg twice daily.  On diuretic therapy creatinine increases and she becomes significantly orthostatic.  In the setting of significant orthostasis we have not tried ARNI or MRA therapy. ? ?Past Medical History:  ?Diagnosis Date  ? Anemia   ? Anxiety   ? Arthritis   ? Asthma   ? CHF (congestive heart failure) ([place])   ? Depression   ? History of chicken pox   ? Migraines   ? Mood disorder ([place])   ? anxiety  ? 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 Type 1 diabetes, uncontrolled, with neuropathy   ? Ph[place]ke  ? ? ?Past Surgical History:  ?Procedure Laterality Date  ? BIOPSY  08/11/2020  ? Procedure: BIOPSY;  Surgeon: Ladene Artist, MD;  Location: [facility];  Service: Endoscopy;;  ? ESOPHAGOGASTRODUODENOSCOPY (EGD) WITH PROPOFOL N/A 08/11/2020  ? Procedure: ESOPHAGOGASTRODUODENOSCOPY (EGD) WITH PROPOFOL;  Surgeon: Ladene Artist, MD;   Location: [facility];  Service: Endoscopy;  Laterality: N/A;  ? treadmill stress test  01/2013  ? WNL, low risk study  ? ? ?Current Medications: ?Current Meds  ?Medication Sig  ? albuterol (PROAIR HFA) 108 (90 Base) MCG/ACT inhaler Inhale 2 puffs into the lungs every 6 (six) hours as needed for wheezing or shortness of breath.  ? Carboxymethylcellul-Glycerin (LUBRICATING EYE DROPS OP) Place 1 drop into both eyes daily as needed (dry eyes).  ? carvedilol (COREG) 12.5 MG tablet Take 0.5 tablets (6.25 mg total) by mouth 2 (two) times daily.  ? Continuous Blood Gluc Sensor (FREESTYLE LIBRE 2 SENSOR) MISC CHANGE EVERY 14 DAYS  ? DUPIXENT 300 MG/2ML SOPN Inject 300 mg as directed every 14 (fourteen) days.  ? feeding supplement, GLUCERNA SHAKE, (GLUCERNA SHAKE) LIQD Take 237 mLs by mouth 2 (two) times daily between meals.  ? hydrOXYzine (ATARAX) 50 MG tablet Take 50 mg by mouth 3 (three) times daily.  ? hydrOXYzine (ATARAX/VISTARIL) 25 MG tablet Take 1 tablet (25 mg total) by mouth 3 (three) times daily as needed.  ? Insulin Disposable Pump (OMNIPOD 10 PACK) MISC by Does not apply route.  ? NOVOLOG 100 UNIT/ML injection SMARTSIG:0-120 Unit(s) SUB-Q Daily  ? polyethylene glycol (MIRALAX / GLYCOLAX) 17 g packet Take 17 g by mouth daily as needed.  ? promethazine (PHENERGAN) 12.5 MG tablet Take 1 tablet (12.5 mg total) by mouth every 6 (six) hours as needed for nausea or vomiting.  ?  tobramycin (TOBREX) 0.3 % ophthalmic solution Place 1 drop into both eyes See admin instructions. Instill 1-2 drop into both eyes 3 times daily the day before, of, and the day after monthly eye injections  ? TRESIBA FLEXTOUCH 200 UNIT/ML FlexTouch Pen Inject into the skin. Inject into the skin.  ?  ? ?Allergies:   Amoxicillin, Atorvastatin, Dulaglutide, Lantus [insulin glargine], and Miconazole nitrate  ? ?Social History  ? ?Socioeconomic History  ? Marital status: Divorced  ?  Spouse name: Not on file  ? Number of children: 2  ? Years of  education: Not on file  ? Highest education level: Not on file  ?Occupational History  ? Occupation: Freight forwarder  ?Tobacco Use  ? Smoking status: Never  ? Smokeless tobacco: Never  ?Vaping Use  ? Vaping Use: Never used  ?Substance and Sexual Activity  ? Alcohol use: Not Currently  ? Drug use: No  ? Sexual activity: Yes  ?  Partners: Male  ?  Birth control/protection: None  ?Other Topics Concern  ? Not on file  ?Social History Narrative  ? Caffeine use: daily  ? Occupation: cleans houses  ? Regular exercise: yes, active 5x/wk  ? Diet: good water, fruits/vegetables daily  ? ?Social Determinants of Health  ? ?Financial Resource Strain: Not on file  ?Food Insecurity: Not on file  ?Transportation Needs: Not on file  ?Physical Activity: Not on file  ?Stress: Not on file  ?Social Connections: Not on file  ?  ? ?Family History: ?The patient's family history includes Bipolar disorder in her mother; CAD in her maternal aunt; Calcium disorder in her mother; Cancer in her maternal grandmother, maternal uncle, and paternal grandmother; Cancer (age of onset: 63) in her maternal aunt; Diabetes in her mother, paternal grandfather, paternal grandmother, and sister; Heart disease in her mother; Heart failure in her sister; Hypertension in her mother; Stroke in her maternal aunt; Thyroid disease in her mother. ? ?ROS:   ?Please see the history of present illness.    ?Significant difficulty with nausea and vomiting.  Barely able to eat without having regurgitation.  She is seeing gastroenterology.  All other systems reviewed and are negative. ? ?EKGs/Labs/Other Studies Reviewed:   ? ?The following studies were reviewed today: ?LE VENOUS DOPPLER 11/16/2021: ?Summary:  ?BILATERAL:  ?- No evidence of deep vein thrombosis seen in the lower extremities,  ?bilaterally.  ?- No evidence of superficial venous thrombosis in the lower extremities,  ?bilaterally.  ?-No evidence of popliteal cyst, bilaterally.  ?RIGHT:  ?- Multiple enlarged lymph nodes  noted in the groin and proximal thigh  ?area, with one measuring 2.6 cm in length and 1.2 cm in width, a second on  ?measuring 3.3 cm in length and 1.8 cm in width and a third one measuring  ?3.3 cm in length and 2.1 cm in  ?width.  ?   ?LEFT:  ?- Multiple enlarged lymph nodes noted in the groin and proximal thigh  ?area, with one measuring 1.4 cm in length and 1.5 cm in width and a second  ?on measuring 1.7 cm in length and 1.3 cm in width.  ? ?PELVIC CT 08/24/2020: ?IMPRESSION: ?1. New large hypoattenuating defect in the spleen measuring ?approximately 8 cm. 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 The splenic artery and vein appear grossly patent ?where visualized. However the distal splenic artery is difficult to ?evaluate secondary to contrast timing. ?2. Moderate-sized bilateral pleural effusions, significantly ?increased in size since the prior study. ?3.  Cardiomegaly with interlobular septal thickening, consistent with ?pulmonary edema. ?4. Small volume of free fluid the patient's pelvis. ?5. Fat containing umbilical hernia. ? ?CT CHEST ABD PELVIS 11/20/2021: ?IMPRESSION:  ?1. Mild bilateral pleural effusions with associated atelectasis. Areas of mild septal thickening bilaterally which may represent mild interstitial edema.  ?2. Mild cardiomegaly. Small amount pericardial fluid. Enlarged, multinodular thyroid gland.  ?3. Nonspecific mildly enlarged bilateral inguinal lymph nodes, largest is a right inguinal lymph node measuring 19 mm x 15 mm.  ? ?Electronically Signed by: Desma Paganini on 11/21/2021 12:33 PM ? ?2D Doppler echocardiogram January 2022: ?IMPRESSIONS  ? ? ? 1. Left ventricular ejection fraction, by estimation, is 40 to 45%. The  ?left ventricle has mildly decreased function. The left ventricle  ?demonstrates global hypokinesis. The left ventricular internal cavity size  ?was mildly dilated. Left ventricular  ?diastolic parameters are indeterminate.  ? 2. Right  ventricular systolic function is normal. The right ventricular  ?size is normal. There is moderately elevated pulmonary artery systolic  ?pressure.  ? 3. Left atrial size was moderately dilated.  ? 4. Large pleur

## 2021-11-27 ENCOUNTER — Other Ambulatory Visit (HOSPITAL_COMMUNITY): Payer: Self-pay | Admitting: Physician Assistant

## 2021-11-27 ENCOUNTER — Other Ambulatory Visit: Payer: Self-pay | Admitting: Physician Assistant

## 2021-11-27 DIAGNOSIS — R590 Localized enlarged lymph nodes: Secondary | ICD-10-CM

## 2021-11-28 ENCOUNTER — Encounter: Payer: Self-pay | Admitting: Interventional Cardiology

## 2021-11-28 ENCOUNTER — Encounter: Payer: Self-pay | Admitting: *Deleted

## 2021-11-28 ENCOUNTER — Telehealth (HOSPITAL_COMMUNITY): Payer: Self-pay

## 2021-11-28 ENCOUNTER — Ambulatory Visit (INDEPENDENT_AMBULATORY_CARE_PROVIDER_SITE_OTHER): Payer: BC Managed Care – PPO | Admitting: Interventional Cardiology

## 2021-11-28 VITALS — BP 148/82 | HR 92 | Ht 72.0 in | Wt 187.8 lb

## 2021-11-28 DIAGNOSIS — I5032 Chronic diastolic (congestive) heart failure: Secondary | ICD-10-CM

## 2021-11-28 DIAGNOSIS — R590 Localized enlarged lymph nodes: Secondary | ICD-10-CM | POA: Diagnosis not present

## 2021-11-28 DIAGNOSIS — R0609 Other forms of dyspnea: Secondary | ICD-10-CM | POA: Diagnosis not present

## 2021-11-28 DIAGNOSIS — M7989 Other specified soft tissue disorders: Secondary | ICD-10-CM | POA: Diagnosis not present

## 2021-11-28 DIAGNOSIS — I951 Orthostatic hypotension: Secondary | ICD-10-CM | POA: Diagnosis not present

## 2021-11-28 DIAGNOSIS — E108 Type 1 diabetes mellitus with unspecified complications: Secondary | ICD-10-CM | POA: Diagnosis not present

## 2021-11-28 MED ORDER — CARVEDILOL 12.5 MG PO TABS: 12.5000 mg | ORAL_TABLET | Freq: Two times a day (BID) | ORAL | Status: DC

## 2021-11-28 NOTE — Patient Instructions (Signed)
Medication Instructions:  ?Your physician recommends that you continue on your current medications as directed. Please refer to the Current Medication list given to you today. ? ?*If you need a refill on your cardiac medications before your next appointment, please call your pharmacy* ? ?Lab Work: ?NONE ? ?Testing/Procedures: ?NONE ? ?Follow-Up: ?At [facility], you and your health needs are our priority.  As part of our continuing mission to provide you with exceptional heart care, we have created designated Provider Care Teams.  These Care Teams include your primary Cardiologist (physician) and Advanced Practice Providers (APPs -  Physician Assistants and Nurse Practitioners) who all work together to provide you with the care you need, when you need it. ? ?Your next appointment:   ?9-12 month(s) ? ?The format for your next appointment:   ?In Person ? ?Provider:   ?Sinclair Grooms, MD { ? ? ? ?Important Information About Sugar ? ? ? ? ?  ?

## 2021-11-28 NOTE — Progress Notes (Unsigned)
Suttle, Rosanne Ashing, MD  [name] ?Approved for ultrasound guided right inguinal lymph node biopsy.  ? ?[name]   ?  ?   ?Previous Messages ?  ?----- Message -----  ?From: [name]  ?Sent: 11/28/2021  10:10 AM EDT  ?To: Megan Battiest, MD  ?Subject: RE: US Biopsy                                  ? ?Just sw office again they said they have pushed the ct images. Try [name]  Feb 19, 1971  ?----- Message -----  ?From: Megan Battiest, MD  ?Sent: 11/27/2021   3:07 PM EDT  ?To: [name]  ?Subject: RE: US Biopsy                                  ? ?Not there.  ?----- Message -----  ?From: [name]  ?Sent: 11/27/2021   3:06 PM EDT  ?To: Megan Battiest, MD  ?Subject: RE: US Biopsy                                  ? ?Can you check again, sw [facility] and they told me the where going to push images  ? ?Thanks,  ?[name]  ?----- Message -----  ?From: Megan Battiest, MD  ?Sent: 11/27/2021   1:21 PM EDT  ?To: [name]  ?Subject: RE: US Biopsy                                  ? ?Cannot see images from recent CT.  Needs these pushed to our PACS in order to review for biopsy.  ? ?Thanks,  ? ?[name]  ?----- Message -----  ?From: [name]  ?Sent: 11/27/2021  10:14 AM EDT  ?To: [facility] Procedure Requests  ?Subject: US Biopsy                                      ? ? ? ?Procedure: US Biopsy  ? ?Reason:  Pelvic Lymphadenopathy  ? ?History- CT done 4/18 @ [facility]- report in care everywhere- not sure if you can see images?  ? ?Provider:  Thayer Ohm  ? ?Provider Contact: [phone]   ?

## 2021-11-30 ENCOUNTER — Other Ambulatory Visit: Payer: Self-pay | Admitting: Student

## 2021-11-30 DIAGNOSIS — K3184 Gastroparesis: Secondary | ICD-10-CM | POA: Diagnosis not present

## 2021-11-30 DIAGNOSIS — K5900 Constipation, unspecified: Secondary | ICD-10-CM | POA: Diagnosis not present

## 2021-11-30 DIAGNOSIS — K219 Gastro-esophageal reflux disease without esophagitis: Secondary | ICD-10-CM | POA: Diagnosis not present

## 2021-12-03 ENCOUNTER — Ambulatory Visit (HOSPITAL_COMMUNITY)
Admission: RE | Admit: 2021-12-03 | Discharge: 2021-12-03 | Disposition: A | Discharge status: Home / Self Care | Payer: BC Managed Care – PPO | Source: Ambulatory Visit | Attending: Physician Assistant | Admitting: Physician Assistant

## 2021-12-03 ENCOUNTER — Ambulatory Visit: Payer: BC Managed Care – PPO | Admitting: Interventional Cardiology

## 2021-12-03 ENCOUNTER — Other Ambulatory Visit: Payer: Self-pay

## 2021-12-03 DIAGNOSIS — I110 Hypertensive heart disease with heart failure: Secondary | ICD-10-CM | POA: Diagnosis not present

## 2021-12-03 DIAGNOSIS — E1143 Type 2 diabetes mellitus with diabetic autonomic (poly)neuropathy: Secondary | ICD-10-CM | POA: Diagnosis not present

## 2021-12-03 DIAGNOSIS — K3184 Gastroparesis: Secondary | ICD-10-CM | POA: Diagnosis not present

## 2021-12-03 DIAGNOSIS — R590 Localized enlarged lymph nodes: Secondary | ICD-10-CM | POA: Diagnosis not present

## 2021-12-03 DIAGNOSIS — I5032 Chronic diastolic (congestive) heart failure: Secondary | ICD-10-CM | POA: Insufficient documentation

## 2021-12-03 DIAGNOSIS — E042 Nontoxic multinodular goiter: Secondary | ICD-10-CM | POA: Diagnosis not present

## 2021-12-03 DIAGNOSIS — F419 Anxiety disorder, unspecified: Secondary | ICD-10-CM | POA: Diagnosis not present

## 2021-12-03 DIAGNOSIS — J90 Pleural effusion, not elsewhere classified: Secondary | ICD-10-CM | POA: Insufficient documentation

## 2021-12-03 LAB — GLUCOSE, CAPILLARY: Glucose-Capillary: 110 mg/dL — ABNORMAL HIGH (ref 70–99)

## 2021-12-03 LAB — PREGNANCY, URINE: Preg Test, Ur: NEGATIVE

## 2021-12-03 MED ORDER — SODIUM CHLORIDE 0.9 % IV SOLN: INTRAVENOUS | Status: DC

## 2021-12-03 MED ORDER — LIDOCAINE HCL (PF) 1 % IJ SOLN
INTRAMUSCULAR | Status: AC
  Filled 2021-12-03: qty 30

## 2021-12-03 MED ORDER — ACETAMINOPHEN 325 MG PO TABS
ORAL_TABLET | ORAL | Status: AC
  Filled 2021-12-03: qty 2

## 2021-12-03 NOTE — H&P (Signed)
Chief Complaint: Patient was seen in consultation today for lymph node biopsy   Referring Physician(s): Delma Officer  Supervising Physician: Dr. Nuala Alpha   Patient Status: [facility] - Out-pt  History of Present Illness: [name] is a 51 y.o. female with a medical history significant for CHF, DM, gastroparesis, anxiety/depression and HTN. She recently underwent CT imaging to further evaluate chronic diastolic heart failure and was found to have lymphadenopathy.   CT chest/abdomen/pelvis 11/21/21 - outside imaging IMPRESSION: 1. Mild bilateral pleural effusions with associated atelectasis. Areas of mild septal thickening bilaterally which may represent mild interstitial edema. 2. Mild cardiomegaly. Small amount pericardial fluid. Enlarged, multinodular thyroid gland. 3. Nonspecific mildly enlarged bilateral inguinal lymph nodes, largest is a right inguinal lymph node measuring 19 mm x 15 mm.   Interventional Radiology has been asked to evaluate this patient for an image-guided lymph node biopsy. Imaging reviewed and right inguinal lymph node biopsy approved by Dr. Elby Showers.   Past Medical History:  Diagnosis Date   Anemia    Anxiety    Arthritis    Asthma    CHF (congestive heart failure) (HCC)    Depression    History of chicken pox    Migraines    Mood disorder (HCC)    anxiety   Prurigo nodularis    with diabetic dermopathy   Type 1 diabetes, uncontrolled, with neuropathy    [place]    Past Surgical History:  Procedure Laterality Date   BIOPSY  08/11/2020   Procedure: BIOPSY;  Surgeon: Meryl Dare, MD;  Location: [facility];  Service: Endoscopy;;   ESOPHAGOGASTRODUODENOSCOPY (EGD) WITH PROPOFOL N/A 08/11/2020   Procedure: ESOPHAGOGASTRODUODENOSCOPY (EGD) WITH PROPOFOL;  Surgeon: Meryl Dare, MD;  Location: [facility];  Service: Endoscopy;  Laterality: N/A;   treadmill stress test  01/2013   WNL, low risk study    Allergies: Amoxicillin, Atorvastatin,  Dulaglutide, Lantus [insulin glargine], and Miconazole nitrate  Medications: Prior to Admission medications   Medication Sig Start Date End Date Taking? Authorizing Provider  acetaminophen (TYLENOL) 500 MG tablet Take 500 mg by mouth every 6 (six) hours as needed for moderate pain.   Yes [provider]  albuterol (PROAIR HFA) 108 (90 Base) MCG/ACT inhaler Inhale 2 puffs into the lungs every 6 (six) hours as needed for wheezing or shortness of breath. 07/03/20  Yes Waldon Merl, PA-C  aspirin-sod bicarb-citric acid (ALKA-SELTZER) 325 MG TBEF tablet Take 650 mg by mouth every 6 (six) hours as needed (indigestion).   Yes [provider]  Carboxymethylcellul-Glycerin (LUBRICATING EYE DROPS OP) Place 1 drop into both eyes daily as needed (dry eyes).   Yes [provider]  carvedilol (COREG) 12.5 MG tablet Take 1 tablet (12.5 mg total) by mouth 2 (two) times daily. 11/28/21  Yes Lyn Records, MD  hydrOXYzine (ATARAX) 50 MG tablet Take 50 mg by mouth 3 (three) times daily as needed for anxiety. 10/01/21  Yes [provider]  NOVOLOG 100 UNIT/ML injection Inject 0-120 Units into the skin daily. 04/06/20  Yes [provider]  polyethylene glycol (MIRALAX / GLYCOLAX) 17 g packet Take 17 g by mouth daily as needed for moderate constipation.   Yes [provider]  tobramycin (TOBREX) 0.3 % ophthalmic solution Place 1 drop into both eyes See admin instructions. Instill 1-2 drop into both eyes 3 times daily the day before, of, and the day after monthly eye injections 11/17/20  Yes [provider]  TRESIBA FLEXTOUCH 200 UNIT/ML FlexTouch  Pen Inject 36 Units into the skin daily as needed (when insulin pump stops working). 11/04/21  Yes [provider]  Continuous Blood Gluc Sensor (FREESTYLE LIBRE 2 SENSOR) MISC CHANGE EVERY 14 DAYS 03/20/20   [provider]  feeding supplement, GLUCERNA SHAKE, (GLUCERNA SHAKE) LIQD Take 237 mLs by  mouth 2 (two) times daily between meals. 08/16/20   Narda Bonds, MD  Insulin Disposable Pump (OMNIPOD 10 PACK) MISC by Does not apply route.    [provider]     Family History  Problem Relation Age of Onset   Diabetes Mother        type 2   Hypertension Mother    Thyroid disease Mother    Bipolar disorder Mother    Heart disease Mother    Calcium disorder Mother    Cancer Maternal Grandmother        Breast, stomach   Cancer Paternal Grandmother        stomach, lung (smoker)   Diabetes Paternal Grandmother    Diabetes Paternal Grandfather    Heart failure Sister    Diabetes Sister    Stroke Maternal Aunt    Cancer Maternal Uncle        prostate   CAD Maternal Aunt        stents   Cancer Maternal Aunt 68       ovarian    Social History   Socioeconomic History   Marital status: Divorced    Spouse name: Not on file   Number of children: 2   Years of education: Not on file   Highest education level: Not on file  Occupational History   Occupation: Production designer, theatre/television/film  Tobacco Use   Smoking status: Never   Smokeless tobacco: Never  Vaping Use   Vaping Use: Never used  Substance and Sexual Activity   Alcohol use: Not Currently   Drug use: No   Sexual activity: Yes    Partners: Male    Birth control/protection: None  Other Topics Concern   Not on file  Social History Narrative   Caffeine use: daily   Occupation: cleans houses   Regular exercise: yes, active 5x/wk   Diet: good water, fruits/vegetables daily   Social Determinants of Health   Financial Resource Strain: Not on file  Food Insecurity: Not on file  Transportation Needs: Not on file  Physical Activity: Not on file  Stress: Not on file  Social Connections: Not on file    Review of Systems: A 12 point ROS discussed and pertinent positives are indicated in the HPI above.  All other systems are negative.  Review of Systems  Constitutional:  Positive for fatigue. Negative for appetite change.   Respiratory:  Negative for cough and shortness of breath.   Cardiovascular:  Negative for chest pain and leg swelling.  Gastrointestinal:  Positive for diarrhea, nausea and vomiting. Negative for abdominal pain.       History of gastroparesis   Neurological:  Negative for headaches.  Hematological:  Positive for adenopathy.   Vital Signs: BP (!) 181/94   Pulse 98   Temp 98.1 F (36.7 C) (Oral)   Resp 18   Ht 6' (1.829 m)   Wt 188 lb (85.3 kg)   LMP 11/17/2021 (Approximate)   SpO2 98%   BMI 25.50 kg/m   Physical Exam Constitutional:      General: She is not in acute distress.    Appearance: She is not ill-appearing.  HENT:  Mouth/Throat:     Mouth: Mucous membranes are moist.     Pharynx: Oropharynx is clear.  Pulmonary:     Effort: Pulmonary effort is normal.  Skin:    General: Skin is warm and dry.  Neurological:     Mental Status: She is alert.    Imaging: VAS [place] LOWER EXTREMITY VENOUS (DVT)  Result Date: 11/16/2021  Lower Venous DVT Study Patient Name:  NASIAH PAWLAK  Date of Exam:   11/14/2021 Medical Rec #: [id number]       Accession #:    [id number] Date of Birth: Aug 01, 1971      Patient Gender: F Patient Age:   64 years Exam Location:  [facility] Procedure:      VAS [place] LOWER EXTREMITY VENOUS (DVT) Referring Phys: Verdis Prime --------------------------------------------------------------------------------  Indications: Bilateral lower extremity swelling x 1 month. Patient denies any unusual SOB.  Risk Factors: None identified. Comparison Study: NA Performing Technologist: Tyna Jaksch RVT  Examination Guidelines: A complete evaluation includes B-mode imaging, spectral Doppler, color Doppler, and power Doppler as needed of all accessible portions of each vessel. Bilateral testing is considered an integral part of a complete examination. Limited examinations for reoccurring indications may be performed as noted. The reflux portion of the exam is performed with the  patient in reverse Trendelenburg.  +---------+---------------+---------+-----------+----------+--------------+ RIGHT    CompressibilityPhasicitySpontaneityPropertiesThrombus Aging +---------+---------------+---------+-----------+----------+--------------+ CFV      Full           Yes      Yes                                 +---------+---------------+---------+-----------+----------+--------------+ SFJ      Full           Yes      Yes                                 +---------+---------------+---------+-----------+----------+--------------+ FV Prox  Full           Yes      Yes                                 +---------+---------------+---------+-----------+----------+--------------+ FV Mid   Full                                                        +---------+---------------+---------+-----------+----------+--------------+ FV DistalFull           Yes      Yes                                 +---------+---------------+---------+-----------+----------+--------------+ PFV      Full           Yes      Yes                                 +---------+---------------+---------+-----------+----------+--------------+ POP      Full           Yes      Yes                                 +---------+---------------+---------+-----------+----------+--------------+  PTV      Full                                                        +---------+---------------+---------+-----------+----------+--------------+ PERO     Full                                                        +---------+---------------+---------+-----------+----------+--------------+ Gastroc  Full                                                        +---------+---------------+---------+-----------+----------+--------------+ GSV      Full           Yes      Yes                                 +---------+---------------+---------+-----------+----------+--------------+    +---------+---------------+---------+-----------+----------+--------------+ LEFT     CompressibilityPhasicitySpontaneityPropertiesThrombus Aging +---------+---------------+---------+-----------+----------+--------------+ CFV      Full           Yes      Yes                                 +---------+---------------+---------+-----------+----------+--------------+ SFJ      Full           Yes      Yes                                 +---------+---------------+---------+-----------+----------+--------------+ FV Prox  Full           Yes      Yes                                 +---------+---------------+---------+-----------+----------+--------------+ FV Mid   Full                                                        +---------+---------------+---------+-----------+----------+--------------+ FV DistalFull           Yes      Yes                                 +---------+---------------+---------+-----------+----------+--------------+ PFV      Full           Yes      Yes                                 +---------+---------------+---------+-----------+----------+--------------+ POP      Full  Yes      Yes                                 +---------+---------------+---------+-----------+----------+--------------+ PTV      Full                                                        +---------+---------------+---------+-----------+----------+--------------+ PERO     Full                                                        +---------+---------------+---------+-----------+----------+--------------+ Gastroc  Full                                                        +---------+---------------+---------+-----------+----------+--------------+ GSV      Full           Yes      Yes                                 +---------+---------------+---------+-----------+----------+--------------+     Summary: BILATERAL: - No evidence of deep  vein thrombosis seen in the lower extremities, bilaterally. - No evidence of superficial venous thrombosis in the lower extremities, bilaterally. -No evidence of popliteal cyst, bilaterally. RIGHT: - Multiple enlarged lymph nodes noted in the groin and proximal thigh area, with one measuring 2.6 cm in length and 1.2 cm in width, a second on measuring 3.3 cm in length and 1.8 cm in width and a third one measuring 3.3 cm in length and 2.1 cm in width.  LEFT: - Multiple enlarged lymph nodes noted in the groin and proximal thigh area, with one measuring 1.4 cm in length and 1.5 cm in width and a second on measuring 1.7 cm in length and 1.3 cm in width.  *See table(s) above for measurements and observations. Electronically signed by Dina Rich MD on 11/16/2021 at 7:38:10 PM.    Final     Labs:  CBC: Recent Labs    04/26/21 0650 09/10/21 0701 11/14/21 1207  WBC 5.7 7.4 5.6  HGB 10.2* 10.8* 10.1*  HCT 31.7* 32.6* 31.2*  PLT 274 300 328    COAGS: Recent Labs    04/26/21 0650  INR 0.8    BMP: Recent Labs    09/10/21 0701 09/10/21 0923 11/14/21 1207  NA 134* 135 139  K 4.1 3.8 5.0  CL 103 108 107*  CO2 23 21* 20  GLUCOSE 97 114* 87  BUN 27* 24* 21  CALCIUM 8.1* 7.4* 8.2*  CREATININE 1.66* 1.43* 1.76*  GFRNONAA 37* 44*  --     LIVER FUNCTION TESTS: No results for input(s): BILITOT, AST, ALT, ALKPHOS, PROT, ALBUMIN in the last 8760 hours.  TUMOR MARKERS: No results for input(s): AFPTM, CEA, CA199, CHROMGRNA in the last 8760 hours.  Assessment and Plan:  Lymphadenopathy: Ellarose Churn, 50 year old female, presents today to  the [facility] Interventional Radiology department for an image-guided lymph node biopsy. The patient elected for this procedure to be done under local anesthesia only.   Risks and benefits of this procedure were discussed with the patient and/or patient's family including, but not limited to bleeding, infection, damage to adjacent structures or low yield  requiring additional tests.  All of the questions were answered and there is agreement to proceed.  Consent signed and in chart.  Thank you for this interesting consult.  I greatly enjoyed meeting Diann Debes and look forward to participating in their care.  A copy of this report was sent to the requesting provider on this date.  Electronically Signed: Alwyn Ren, AGACNP-BC [phone] 12/03/2021, 12:43 PM   I spent a total of  30 Minutes   in face to face in clinical consultation, greater than 50% of which was counseling/coordinating care for lymph node biopsy

## 2021-12-03 NOTE — Procedures (Signed)
Interventional Radiology Procedure Note ? ?Procedure: US Guided Biopsy of right inguinal lymph node ? ?Complications: None ? ?Estimated Blood Loss: < 10 mL ? ?Specimen: Multiple core biopsy ? ?Recs: ?- local routine wound care ?- follow up pathology ?-DC when goals met ? ?Dr. Gerhard Perches, MD ? ?  ?

## 2021-12-04 LAB — SURGICAL PATHOLOGY

## 2021-12-07 ENCOUNTER — Telehealth: Payer: Self-pay | Admitting: Hematology and Oncology

## 2021-12-07 NOTE — Telephone Encounter (Signed)
Scheduled appt per 5/4 referral. Pt is aware of appt date and time. Pt is aware to arrive 15 mins prior to appt time and to bring and updated insurance card. Pt is aware of appt location.   ?

## 2021-12-10 ENCOUNTER — Ambulatory Visit
Admission: RE | Admit: 2021-12-10 | Discharge: 2021-12-10 | Disposition: A | Discharge status: Home / Self Care | Payer: Self-pay | Source: Ambulatory Visit | Attending: Hematology and Oncology | Admitting: Hematology and Oncology

## 2021-12-10 ENCOUNTER — Other Ambulatory Visit: Payer: Self-pay

## 2021-12-10 DIAGNOSIS — R1084 Generalized abdominal pain: Secondary | ICD-10-CM

## 2021-12-10 DIAGNOSIS — I5032 Chronic diastolic (congestive) heart failure: Secondary | ICD-10-CM

## 2021-12-14 ENCOUNTER — Other Ambulatory Visit: Payer: Self-pay

## 2021-12-14 ENCOUNTER — Inpatient Hospital Stay: Payer: BC Managed Care – PPO | Attending: Hematology and Oncology | Admitting: Hematology and Oncology

## 2021-12-14 ENCOUNTER — Encounter: Payer: Self-pay | Admitting: Hematology and Oncology

## 2021-12-14 VITALS — BP 184/99 | HR 100 | Temp 97.7°F | Resp 16 | Ht 72.0 in | Wt 191.4 lb

## 2021-12-14 DIAGNOSIS — D539 Nutritional anemia, unspecified: Secondary | ICD-10-CM | POA: Diagnosis not present

## 2021-12-14 DIAGNOSIS — R590 Localized enlarged lymph nodes: Secondary | ICD-10-CM | POA: Insufficient documentation

## 2021-12-14 DIAGNOSIS — Z79899 Other long term (current) drug therapy: Secondary | ICD-10-CM | POA: Diagnosis not present

## 2021-12-14 DIAGNOSIS — Z794 Long term (current) use of insulin: Secondary | ICD-10-CM | POA: Insufficient documentation

## 2021-12-14 DIAGNOSIS — E119 Type 2 diabetes mellitus without complications: Secondary | ICD-10-CM | POA: Insufficient documentation

## 2021-12-14 NOTE — Assessment & Plan Note (Signed)
I have reviewed multiple CT imaging with the patient ?She had bilateral lymphadenopathy for several years, likely reactive in nature, related to chronic nonhealing diabetic ulcers ?She is reassured ?I plan to monitor clinically ?There is no indication for excisional lymph node biopsy at this point ?

## 2021-12-14 NOTE — Progress Notes (Signed)
? ?[facility] ?OFFICE PROGRESS NOTE ? ?[place], [place], [place] ? ?ASSESSMENT & PLAN:  ?Inguinal adenopathy ?I have reviewed multiple CT imaging with the patient ?She had bilateral lymphadenopathy for several years, likely reactive in nature, related to chronic nonhealing diabetic ulcers ?She is reassured ?I plan to monitor clinically ?There is no indication for excisional lymph node biopsy at this point ? ?Deficiency anemia ?She has history of multifactorial anemia, likely due to anemia chronic illness ?She does have chronic kidney disease ?Monitor closely ? ?Orders Placed This Encounter  ?Procedures  ? CBC with Differential ([facility] Only)  ?  Standing Status:   Future  ?  Standing Expiration Date:   12/15/2022  ? CMP ([facility] only)  ?  Standing Status:   Future  ?  Standing Expiration Date:   12/15/2022  ? ? ?The total time spent in the appointment was 20 minutes encounter with patients including review of chart and various tests results, discussions about plan of care and coordination of care plan ? ? All questions were answered. The patient knows to call the clinic with any problems, questions or concerns. No barriers to learning was detected. ? ? ? Heath Lark, MD ?5/12/202312:07 PM ? ?INTERVAL HISTORY: ?[name] 51 y.o. female returns for further follow-up regarding bilateral lymphadenopathy ?The patient have chronic leg ulcers and bilateral lower extremity edema ?Most recently, she had CT imaging which showed pelvic lymphadenopathy ?She underwent biopsy that came back nondiagnostic ?Due to concern for malignancy, she is being referred here ?Her leg ulcers are slowly healing and she has not been on wound care or antibiotics for some time ?She denies lymphadenopathy elsewhere ? ?SUMMARY OF HEMATOLOGIC HISTORY: ?[name] is here because of recent findings of anemia ?The patient has insulin-dependent diabetes for many years ?She was recently found to have chronic  anemia ? ?Her baseline hemoglobin from 10 years ago were within normal limits ?Starting late last year, she has progressive anemia ?Her recent blood work showed hemoglobin of 9 with iron deficiency ?The patient have history of poorly controlled diabetes ?She had significant leg ulcers 5 years ago that was successfully treated at the wound care center ?Approximately a year ago, she started to have significant ulcer on both legs with open discharge at times and bleeding ? ?She denies recent chest pain on exertion, shortness of breath on minimal exertion, pre-syncopal episodes, or palpitations. ?She had not noticed any recent bleeding such as epistaxis, hematuria or hematochezia ?The patient denies over the counter NSAID ingestion. She is not on antiplatelets agents. ? ?She had no prior history or diagnosis of cancer. Her age appropriate screening programs are up-to-date. ?She denies has pica with excessive chewing of ice and eats a variety of diet. ?She never donated blood or received blood transfusion ?The patient was prescribed oral iron supplements and she takes them in the past complicated by severe nausea and constipation ?She had CT imaging in April 2023 which showed lymphadenopathy and was being referred back for concern for malignancy ? ?I have reviewed the past medical history, past surgical history, social history and family history with the patient and they are unchanged from previous note. ? ?ALLERGIES:  is allergic to amoxicillin, atorvastatin, dulaglutide, lantus [insulin glargine], and miconazole nitrate. ? ?MEDICATIONS:  ?Current Outpatient Medications  ?Medication Sig Dispense Refill  ? acetaminophen (TYLENOL) 500 MG tablet Take 500 mg by mouth every 6 (six) hours as needed for moderate pain.    ? albuterol (PROAIR HFA) 108 (  90 Base) MCG/ACT inhaler Inhale 2 puffs into the lungs every 6 (six) hours as needed for wheezing or shortness of breath. 6.7 g 1  ? aspirin-sod bicarb-citric acid (ALKA-SELTZER)  325 MG TBEF tablet Take 650 mg by mouth every 6 (six) hours as needed (indigestion).    ? Carboxymethylcellul-Glycerin (LUBRICATING EYE DROPS OP) Place 1 drop into both eyes daily as needed (dry eyes).    ? carvedilol (COREG) 12.5 MG tablet Take 1 tablet (12.5 mg total) by mouth 2 (two) times daily.    ? Continuous Blood Gluc Sensor (FREESTYLE LIBRE 2 SENSOR) MISC CHANGE EVERY 14 DAYS    ? feeding supplement, GLUCERNA SHAKE, (GLUCERNA SHAKE) LIQD Take 237 mLs by mouth 2 (two) times daily between meals.  0  ? hydrOXYzine (ATARAX) 50 MG tablet Take 50 mg by mouth 3 (three) times daily as needed for anxiety.    ? Insulin Disposable Pump (OMNIPOD 10 PACK) MISC by Does not apply route.    ? NOVOLOG 100 UNIT/ML injection Inject 0-120 Units into the skin daily.    ? polyethylene glycol (MIRALAX / GLYCOLAX) 17 g packet Take 17 g by mouth daily as needed for moderate constipation.    ? tobramycin (TOBREX) 0.3 % ophthalmic solution Place 1 drop into both eyes See admin instructions. Instill 1-2 drop into both eyes 3 times daily the day before, of, and the day after monthly eye injections    ? TRESIBA FLEXTOUCH 200 UNIT/ML FlexTouch Pen Inject 36 Units into the skin daily as needed (when insulin pump stops working).    ? ?No current facility-administered medications for this visit.  ?  ? ?REVIEW OF SYSTEMS:   ?Constitutional: Denies fevers, chills or night sweats ?Eyes: Denies blurriness of vision ?Ears, nose, mouth, throat, and face: Denies mucositis or sore throat ?Respiratory: Denies cough, dyspnea or wheezes ?Cardiovascular: Denies palpitation, chest discomfort or lower extremity swelling ?Gastrointestinal:  Denies nausea, heartburn or change in bowel habits ?Skin: Denies abnormal skin rashes ?Lymphatics: Denies new lymphadenopathy or easy bruising ?Neurological:Denies numbness, tingling or new weaknesses ?Behavioral/Psych: Mood is stable, no new changes  ?All other systems were reviewed with the patient and are  negative. ? ?PHYSICAL EXAMINATION: ?ECOG PERFORMANCE STATUS: 1 - Symptomatic but completely ambulatory ? ?Vitals:  ? 12/14/21 0827  ?BP: (!) 184/99  ?Pulse: 100  ?Resp: 16  ?Temp: 97.7 ?F (36.5 ?C)  ?SpO2: 100%  ? ?Filed Weights  ? 12/14/21 0827  ?Weight: 191 lb 6.4 oz (86.8 kg)  ? ? ?GENERAL:alert, no distress and comfortable ?SKIN: Noted chronic changes to both legs consistent with ulcers in different stages of healing ?EYES: normal, Conjunctiva are pink and non-injected, sclera clear ?OROPHARYNX:no exudate, no erythema and lips, buccal mucosa, and tongue normal  ?NECK: supple, thyroid normal size, non-tender, without nodularity ?LYMPH: No palpable lymphadenopathy ?LUNGS: clear to auscultation and percussion with normal breathing effort ?HEART: regular rate & rhythm and no murmurs and no lower extremity edema ?ABDOMEN:abdomen soft, non-tender and normal bowel sounds ?Musculoskeletal:no cyanosis of digits and no clubbing  ?NEURO: alert & oriented x 3 with fluent speech, no focal motor/sensory deficits ? ?LABORATORY DATA:  ?I have reviewed the data as listed ? ?   ?Component Value Date/Time  ? NA 139 11/14/2021 1207  ? K 5.0 11/14/2021 1207  ? CL 107 (H) 11/14/2021 1207  ? CO2 20 11/14/2021 1207  ? GLUCOSE 87 11/14/2021 1207  ? GLUCOSE 114 (H) 09/10/2021 5366  ? BUN 21 11/14/2021 1207  ? CREATININE 1.76 (H)  11/14/2021 1207  ? CALCIUM 8.2 (L) 11/14/2021 1207  ? PROT 6.3 08/30/2020 1604  ? PROT 5.5 (L) 11/24/2019 0934  ? ALBUMIN 3.0 (L) 08/30/2020 1604  ? ALBUMIN 3.0 (L) 11/24/2019 0934  ? AST 17 08/30/2020 1604  ? ALT 15 08/30/2020 1604  ? ALKPHOS 70 08/30/2020 1604  ? BILITOT 0.4 08/30/2020 1604  ? BILITOT 0.3 11/24/2019 0934  ? GFRNONAA 44 (L) 09/10/2021 5391  ? GFRAA 102 01/31/2020 1008  ? ? ?No results found for: SPEP, UPEP ? ?Lab Results  ?Component Value Date  ? WBC 5.6 11/14/2021  ? NEUTROABS 4.6 09/10/2021  ? HGB 10.1 (L) 11/14/2021  ? HCT 31.2 (L) 11/14/2021  ? MCV 86 11/14/2021  ? PLT 328 11/14/2021  ? ? ?   Chemistry   ?   ?Component Value Date/Time  ? NA 139 11/14/2021 1207  ? K 5.0 11/14/2021 1207  ? CL 107 (H) 11/14/2021 1207  ? CO2 20 11/14/2021 1207  ? BUN 21 11/14/2021 1207  ? CREATININE 1.76 (H) 11/14/2021 1207

## 2021-12-14 NOTE — Assessment & Plan Note (Signed)
She has history of multifactorial anemia, likely due to anemia chronic illness ?She does have chronic kidney disease ?Monitor closely ?

## 2021-12-21 DIAGNOSIS — E113513 Type 2 diabetes mellitus with proliferative diabetic retinopathy with macular edema, bilateral: Secondary | ICD-10-CM | POA: Diagnosis not present

## 2021-12-21 DIAGNOSIS — H35371 Puckering of macula, right eye: Secondary | ICD-10-CM | POA: Diagnosis not present

## 2021-12-21 DIAGNOSIS — H4311 Vitreous hemorrhage, right eye: Secondary | ICD-10-CM | POA: Diagnosis not present

## 2021-12-21 DIAGNOSIS — H3582 Retinal ischemia: Secondary | ICD-10-CM | POA: Diagnosis not present

## 2021-12-25 DIAGNOSIS — Z124 Encounter for screening for malignant neoplasm of cervix: Secondary | ICD-10-CM | POA: Diagnosis not present

## 2021-12-25 DIAGNOSIS — Z113 Encounter for screening for infections with a predominantly sexual mode of transmission: Secondary | ICD-10-CM | POA: Diagnosis not present

## 2021-12-25 DIAGNOSIS — I5032 Chronic diastolic (congestive) heart failure: Secondary | ICD-10-CM | POA: Diagnosis not present

## 2021-12-25 DIAGNOSIS — E785 Hyperlipidemia, unspecified: Secondary | ICD-10-CM | POA: Diagnosis not present

## 2021-12-25 DIAGNOSIS — E8779 Other fluid overload: Secondary | ICD-10-CM | POA: Diagnosis not present

## 2021-12-25 DIAGNOSIS — Z0000 Encounter for general adult medical examination without abnormal findings: Secondary | ICD-10-CM | POA: Diagnosis not present

## 2021-12-25 DIAGNOSIS — E1165 Type 2 diabetes mellitus with hyperglycemia: Secondary | ICD-10-CM | POA: Diagnosis not present

## 2021-12-25 DIAGNOSIS — E1142 Type 2 diabetes mellitus with diabetic polyneuropathy: Secondary | ICD-10-CM | POA: Diagnosis not present

## 2021-12-27 DIAGNOSIS — E1165 Type 2 diabetes mellitus with hyperglycemia: Secondary | ICD-10-CM | POA: Diagnosis not present

## 2021-12-27 DIAGNOSIS — N1831 Chronic kidney disease, stage 3a: Secondary | ICD-10-CM | POA: Diagnosis not present

## 2021-12-27 DIAGNOSIS — E041 Nontoxic single thyroid nodule: Secondary | ICD-10-CM | POA: Diagnosis not present

## 2021-12-27 DIAGNOSIS — Z794 Long term (current) use of insulin: Secondary | ICD-10-CM | POA: Diagnosis not present

## 2021-12-27 DIAGNOSIS — I10 Essential (primary) hypertension: Secondary | ICD-10-CM | POA: Diagnosis not present

## 2021-12-27 DIAGNOSIS — E1140 Type 2 diabetes mellitus with diabetic neuropathy, unspecified: Secondary | ICD-10-CM | POA: Diagnosis not present

## 2021-12-27 DIAGNOSIS — E049 Nontoxic goiter, unspecified: Secondary | ICD-10-CM | POA: Diagnosis not present

## 2021-12-27 DIAGNOSIS — D509 Iron deficiency anemia, unspecified: Secondary | ICD-10-CM | POA: Diagnosis not present

## 2021-12-27 DIAGNOSIS — R809 Proteinuria, unspecified: Secondary | ICD-10-CM | POA: Diagnosis not present

## 2022-01-08 DIAGNOSIS — I5032 Chronic diastolic (congestive) heart failure: Secondary | ICD-10-CM | POA: Diagnosis not present

## 2022-01-08 DIAGNOSIS — I10 Essential (primary) hypertension: Secondary | ICD-10-CM | POA: Diagnosis not present

## 2022-01-29 DIAGNOSIS — E113511 Type 2 diabetes mellitus with proliferative diabetic retinopathy with macular edema, right eye: Secondary | ICD-10-CM | POA: Diagnosis not present

## 2022-02-01 DIAGNOSIS — E113513 Type 2 diabetes mellitus with proliferative diabetic retinopathy with macular edema, bilateral: Secondary | ICD-10-CM | POA: Diagnosis not present

## 2022-02-01 DIAGNOSIS — H35371 Puckering of macula, right eye: Secondary | ICD-10-CM | POA: Diagnosis not present

## 2022-02-01 DIAGNOSIS — H3582 Retinal ischemia: Secondary | ICD-10-CM | POA: Diagnosis not present

## 2022-02-01 DIAGNOSIS — H43823 Vitreomacular adhesion, bilateral: Secondary | ICD-10-CM | POA: Diagnosis not present

## 2022-03-11 IMAGING — CT CT ABD-PELV W/ CM
2 of 5 series · 15 of 46 positions shown, 17 images · IV contrast (OMNIPAQUE 300)
Comparison: CT chest dated August 14, 2020. CT dated August 08, 2020

CLINICAL DATA: Left lower quadrant abdominal pain.

EXAM:
CT ABDOMEN AND PELVIS WITH CONTRAST
TECHNIQUE: Multidetector CT imaging of the abdomen and pelvis was performed
using the standard protocol following bolus administration of
intravenous contrast.
CONTRAST:  100mL OMNIPAQUE IOHEXOL 300 MG/ML  SOLN

[Series 2: axial st · axial · 0.73mm/px · z∈[+1146,+1541]mm · 12 of 91 slices shown, 14 images]
[im 6/91  soft-tissue]
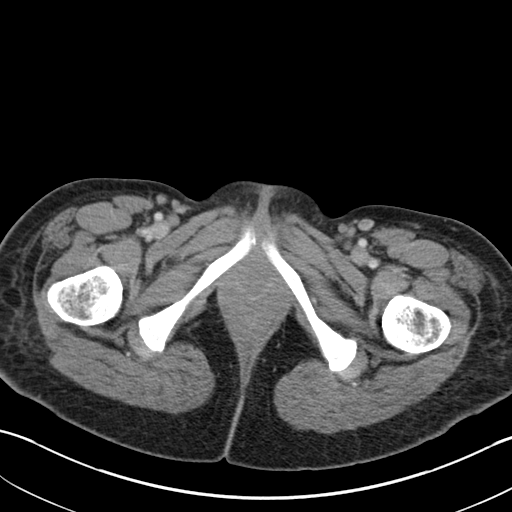
[im 6/91  bone]
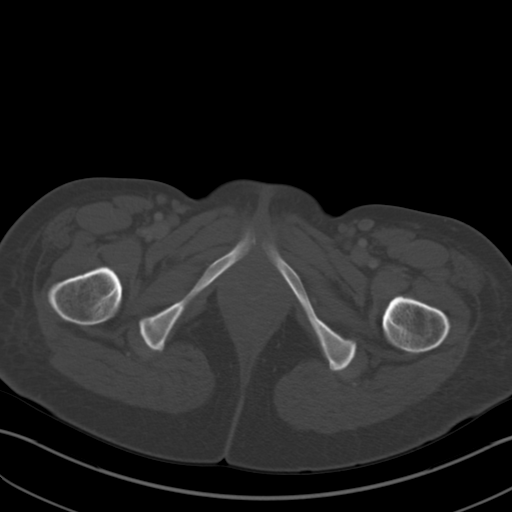
[im 16/91  soft-tissue]
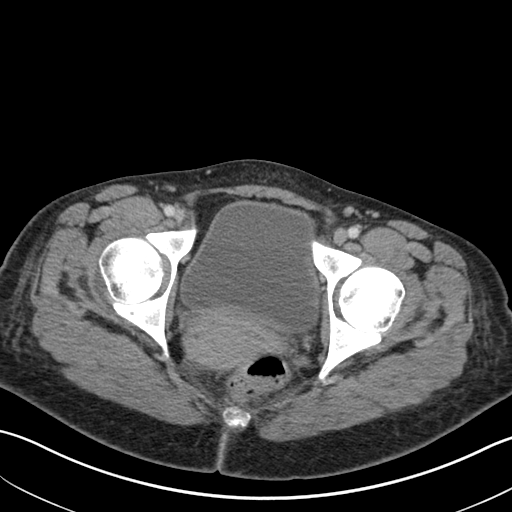
[im 22/91  soft-tissue]
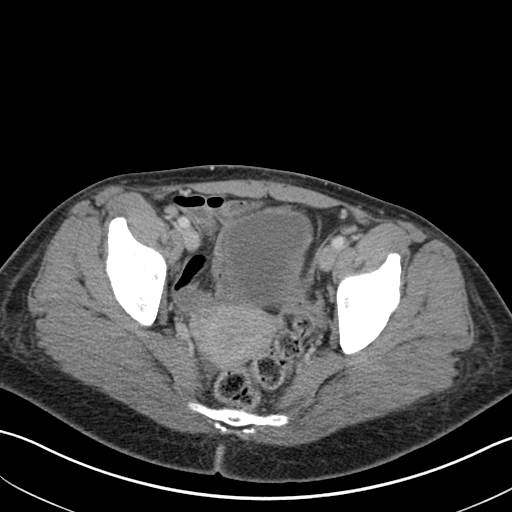
[im 27/91  soft-tissue]
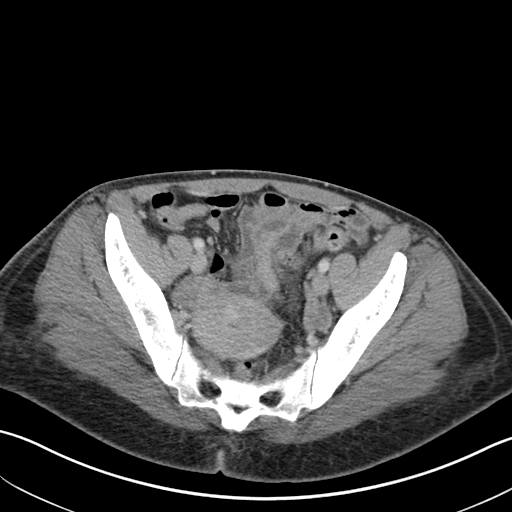
[im 38/91  soft-tissue]
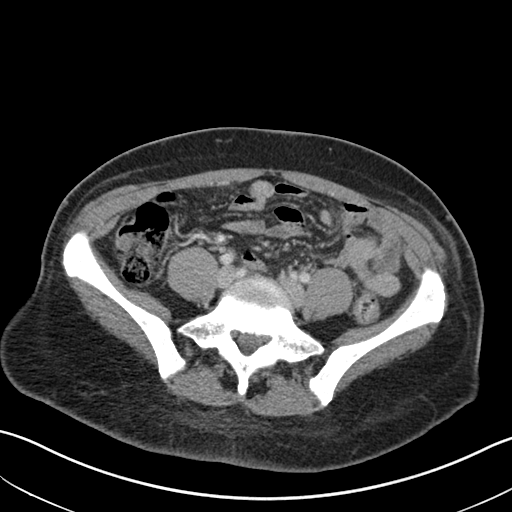
[im 43/91  soft-tissue]
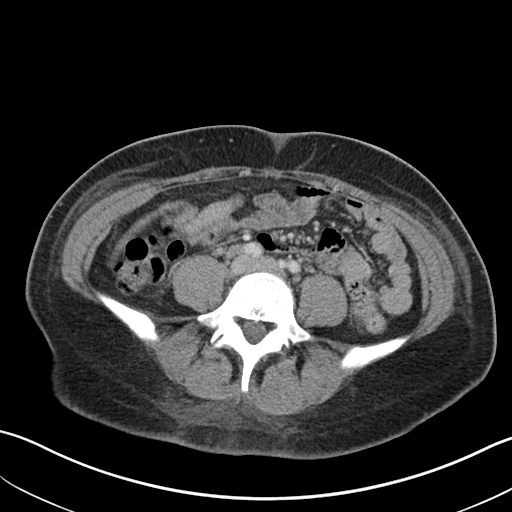
[im 48/91  soft-tissue]
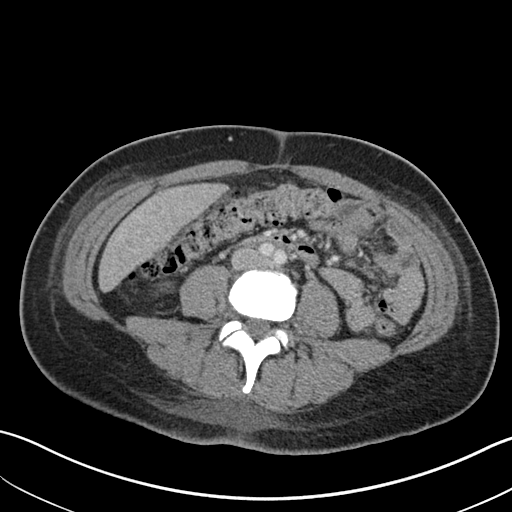
[im 59/91  soft-tissue]
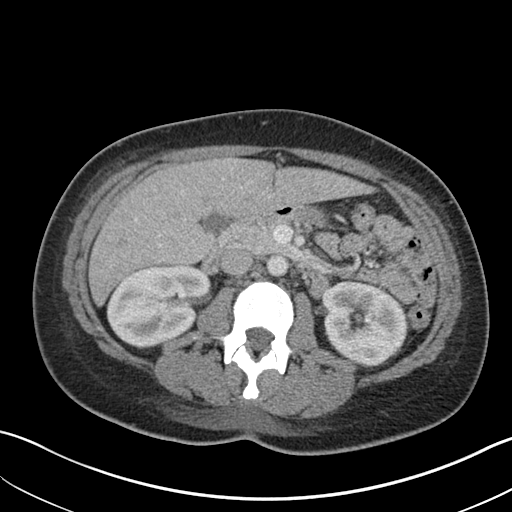
[im 64/91  soft-tissue]
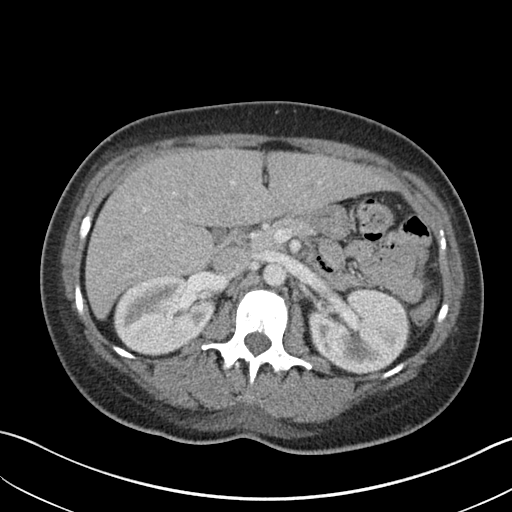
[im 64/91  bone]
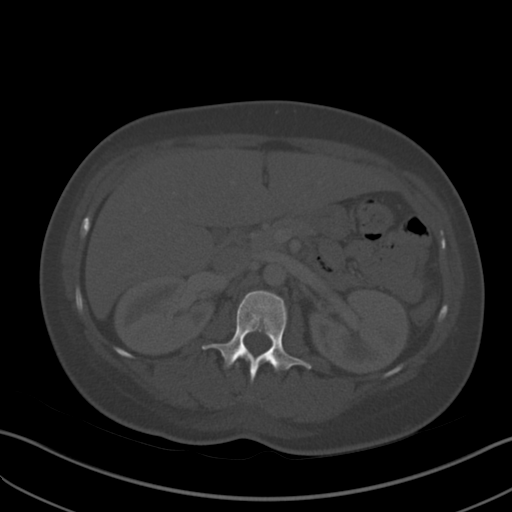
[im 69/91  soft-tissue]
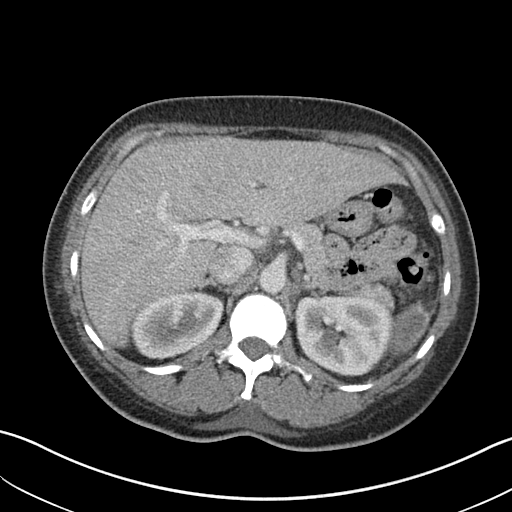
[im 80/91  soft-tissue]
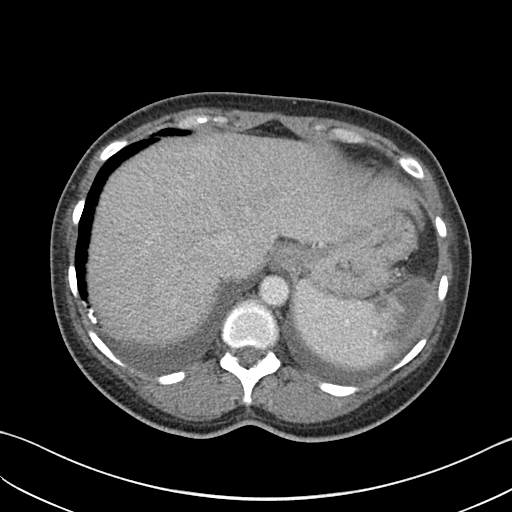
[im 85/91  soft-tissue]
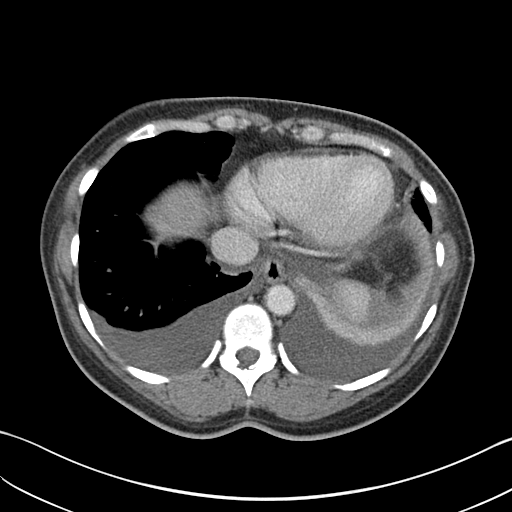

[Series 4: coronal st · coronal · 0.67mm/px · 3 of 131 slices shown]
[im 44/131  soft-tissue]
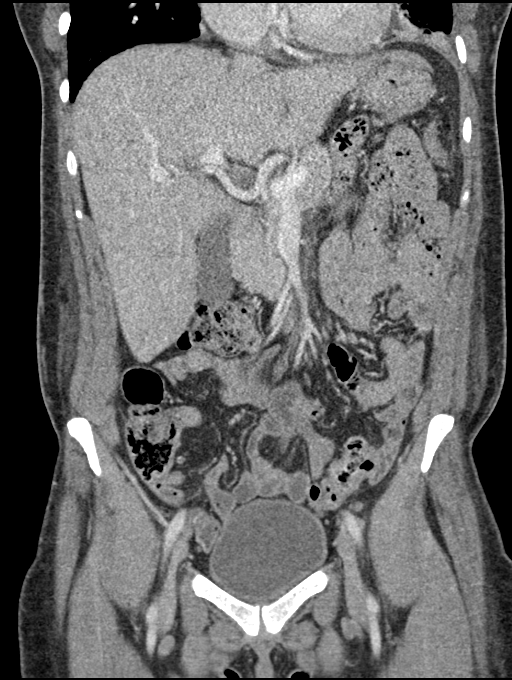
[im 58/131  soft-tissue]
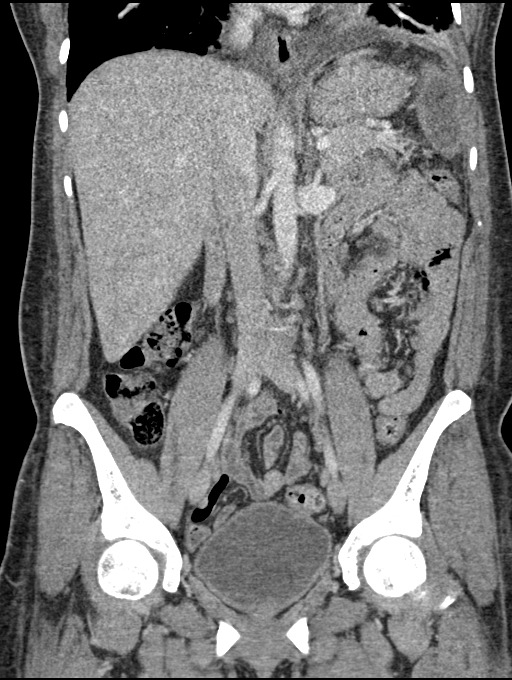
[im 73/131  soft-tissue]
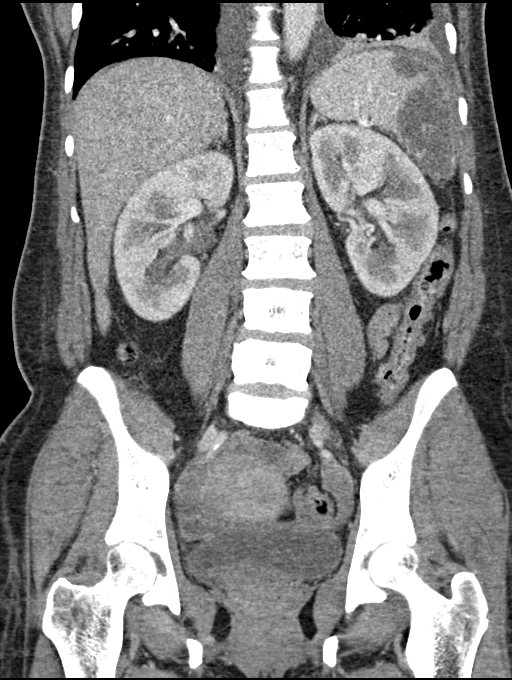

[15 of 46 positions shown; findings below may reference images not displayed]

FINDINGS: Lower chest: There are moderate-sized bilateral pleural effusions,
significantly increased in size since the prior study.The heart is
enlarged. Again noted is interlobular septal thickening

Hepatobiliary: The liver is normal. Normal gallbladder.There is no
biliary ductal dilation.

Pancreas: Normal contours without ductal dilatation. No
peripancreatic fluid collection.

Spleen: There is a new large hypoattenuating defect in the spleen
measuring approximately 8 cm. This is consistent with interval
development of a splenic infarct. The splenic artery and vein appear
grossly patent where visualized. However the distal splenic artery
is difficult to evaluate secondary to contrast timing.

Adrenals/Urinary Tract:

--Adrenal glands: Unremarkable.

--Right kidney/ureter: No hydronephrosis or radiopaque kidney
stones.

--Left kidney/ureter: No hydronephrosis or radiopaque kidney stones.

--Urinary bladder: Unremarkable.

Stomach/Bowel:

--Stomach/Duodenum: No hiatal hernia or other gastric abnormality.
Normal duodenal course and caliber.

--Small bowel: Unremarkable.

--Colon: Unremarkable.

--Appendix: Normal.

Vascular/Lymphatic: Normal course and caliber of the major abdominal
vessels.

--No retroperitoneal lymphadenopathy.

--No mesenteric lymphadenopathy.

--No pelvic or inguinal lymphadenopathy.

Reproductive: Unremarkable

Other: There is a small volume of free fluid the patient's pelvis.
No free air. There is a fat containing umbilical hernia.

Musculoskeletal. No acute displaced fractures.
IMPRESSION: 1. New large hypoattenuating defect in the spleen measuring
approximately 8 cm. This is consistent with interval development of
a splenic infarct. The splenic artery and vein appear grossly patent
where visualized. However the distal splenic artery is difficult to
evaluate secondary to contrast timing.
2. Moderate-sized bilateral pleural effusions, significantly
increased in size since the prior study.
3. Cardiomegaly with interlobular septal thickening, consistent with
pulmonary edema.
4. Small volume of free fluid the patient's pelvis.
5. Fat containing umbilical hernia.

## 2022-03-11 IMAGING — CT CT CTA ABD/PEL W/CM AND/OR W/O CM
2 of 10 series · 13 of 46 positions shown, 15 images · IV contrast (omnipaque)
Comparison: 08/14/2020

CLINICAL DATA: Splenic infarct.  Sickle cell trait.

EXAM:
CTA ABDOMEN AND PELVIS WITHOUT AND WITH CONTRAST
TECHNIQUE: Multidetector CT imaging of the abdomen and pelvis was performed
using the standard protocol during bolus administration of
intravenous contrast. Multiplanar reconstructed images and MIPs were
obtained and reviewed to evaluate the vascular anatomy.
CONTRAST:  100mL OMNIPAQUE IOHEXOL 350 MG/ML SOLN

[Series 7: axial venous · axial · portal-venous · 0.95mm/px · z∈[+1053,+1435]mm · 11 of 231 slices shown, 13 images]
[im 20/231  soft-tissue]
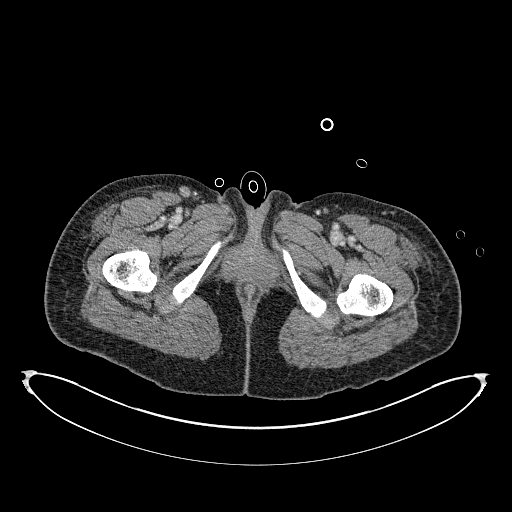
[im 20/231  bone]
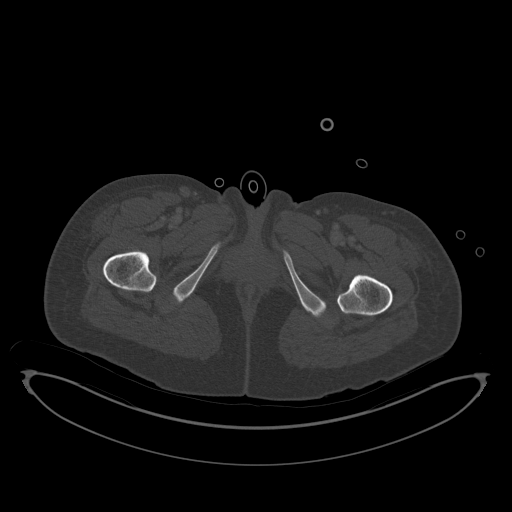
[im 39/231  soft-tissue]
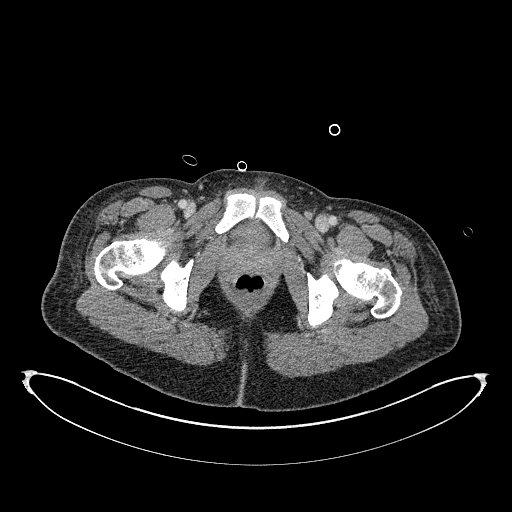
[im 58/231  soft-tissue]
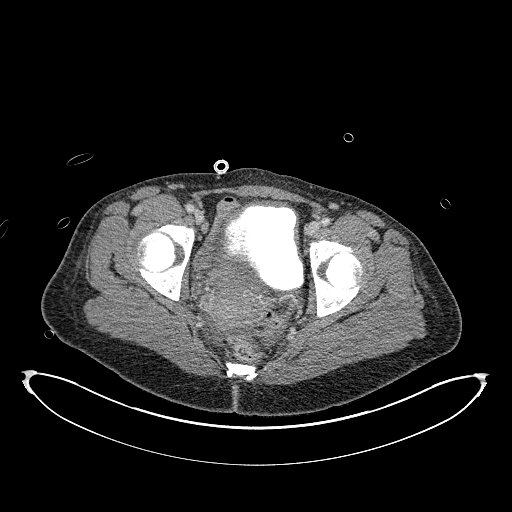
[im 77/231  soft-tissue]
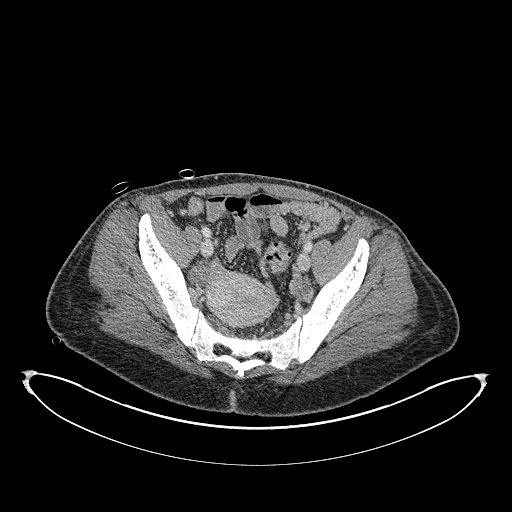
[im 96/231  soft-tissue]
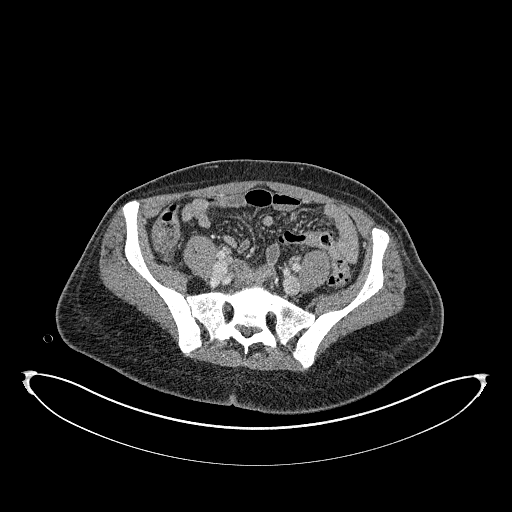
[im 116/231  soft-tissue]
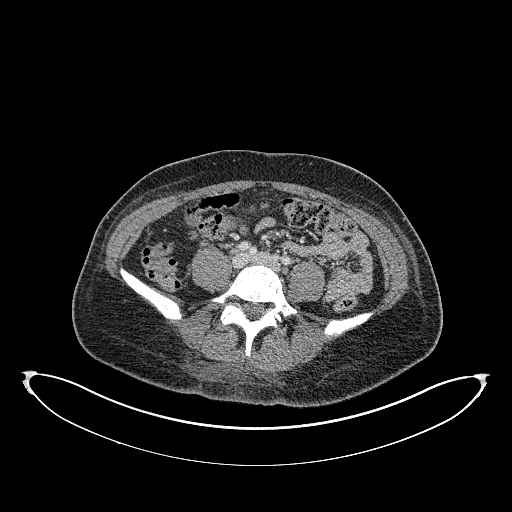
[im 135/231  soft-tissue]
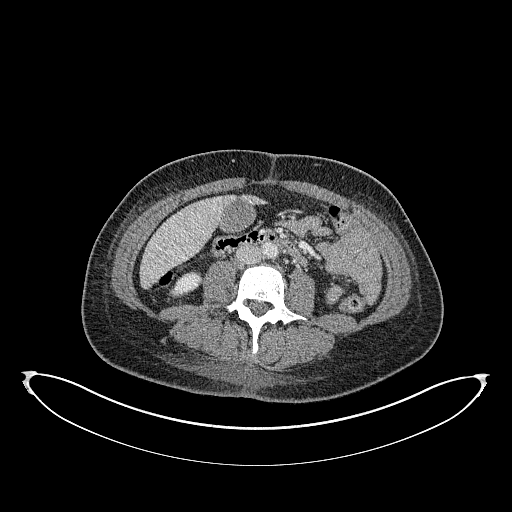
[im 154/231  soft-tissue]
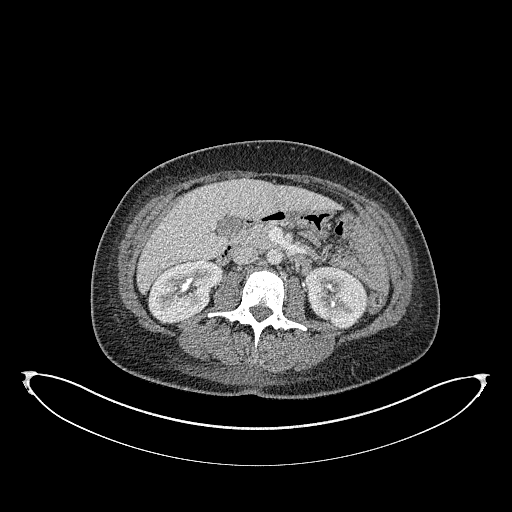
[im 173/231  soft-tissue]
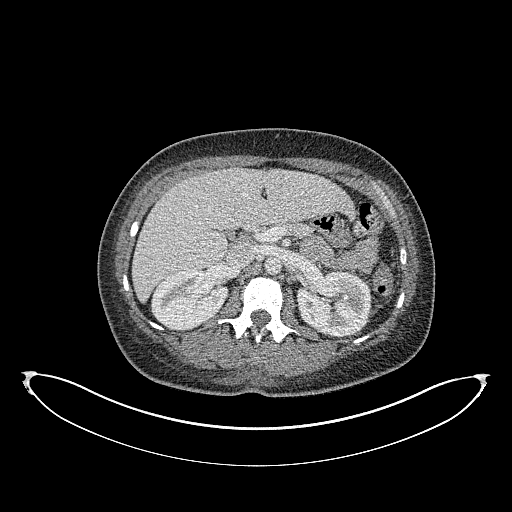
[im 173/231  bone]
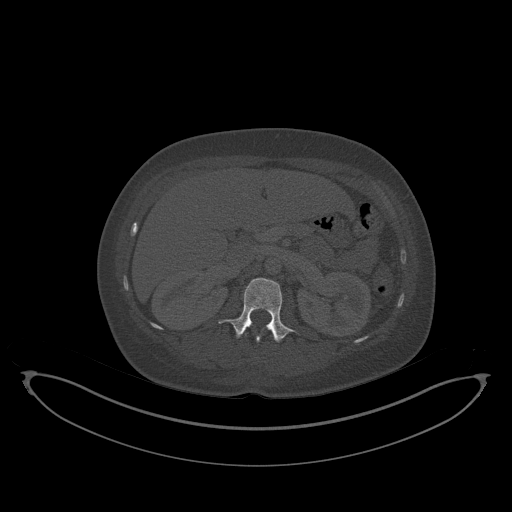
[im 192/231  soft-tissue]
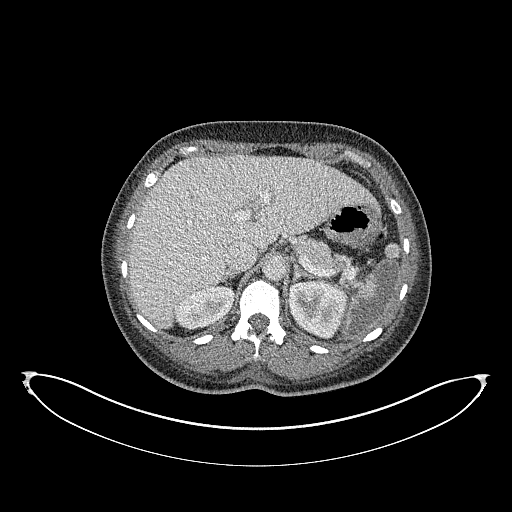
[im 211/231  soft-tissue]
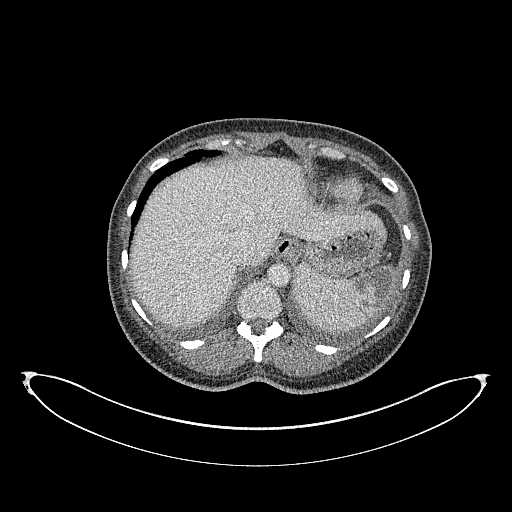

[Series 14: coronal mpr · coronal · 0.84mm/px · 2 of 170 slices shown]
[im 57/170  soft-tissue]
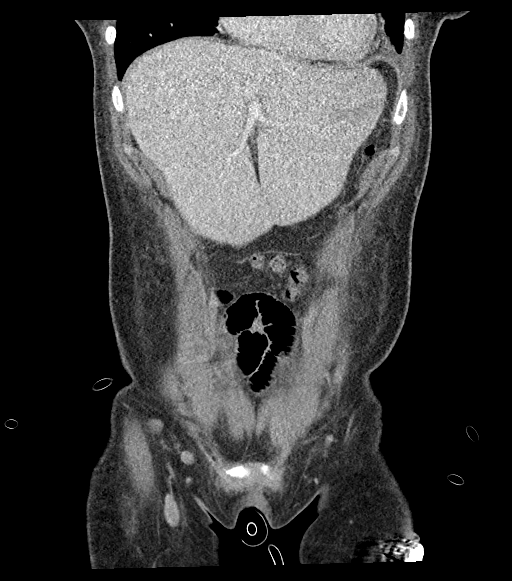
[im 113/170  soft-tissue]
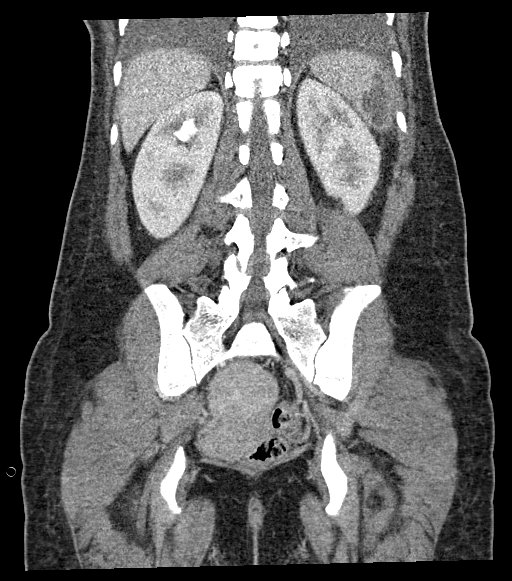

[13 of 46 positions shown; findings below may reference images not displayed]

FINDINGS: VASCULAR

Aorta: Normal caliber aorta without aneurysm, dissection, vasculitis
or significant stenosis.

Celiac: Patent without evidence of aneurysm, dissection, vasculitis
or significant stenosis.

SMA: Patent without evidence of aneurysm, dissection, vasculitis or
significant stenosis.

Renals: Both renal arteries are patent without evidence of aneurysm,
dissection, vasculitis, fibromuscular dysplasia or significant
stenosis.

IMA: Patent without evidence of aneurysm, dissection, vasculitis or
significant stenosis.

Inflow: Patent without evidence of aneurysm, dissection, vasculitis
or significant stenosis.

Proximal Outflow: Bilateral common femoral and visualized portions
of the superficial and profunda femoral arteries are patent without
evidence of aneurysm, dissection, vasculitis or significant
stenosis.

Veins: Portal, splenic, and supervised surg veins are patent.

Review of the MIP images confirms the above findings.

NON-VASCULAR

Lower chest: Small to moderate bilateral pleural effusions again
seen.

Hepatobiliary: No focal liver abnormality is seen. No gallstones,
gallbladder wall thickening, or biliary dilatation.

Pancreas: Unremarkable. No pancreatic ductal dilatation or
surrounding inflammatory changes.

Spleen: Nonenhancing regions of the spleen are similar to that seen
on the prior examination. Findings are consistent with infarct.
Splenic artery and veins are patent. No splenic artery aneurysm
identified.

Adrenals/Urinary Tract: No significant abnormality of the kidneys
and ureters. Adrenal glands are unremarkable. There is diffuse
bladder wall thickening which may be partly due to under distension,
however similar findings can be seen with chronic cystitis and
outlet obstruction.

Stomach/Bowel: Stomach is within normal limits. Appendix appears
normal. No evidence of bowel wall thickening, distention, or
inflammatory changes.

Lymphatic: No enlarged lymph nodes.

Reproductive: Uterus and bilateral adnexa are unremarkable.

Other: Small fat containing umbilical hernia. Mild diffuse body wall
edema.

Musculoskeletal: No acute or significant osseous findings.
IMPRESSION: VASCULAR

Splenic artery and vein are patent. No splenic artery aneurysm. No
significant stenosis of splenic or celiac artery.

NON-VASCULAR

Redemonstration of splenic infarct.

## 2022-03-15 DIAGNOSIS — H43823 Vitreomacular adhesion, bilateral: Secondary | ICD-10-CM | POA: Diagnosis not present

## 2022-03-15 DIAGNOSIS — H43812 Vitreous degeneration, left eye: Secondary | ICD-10-CM | POA: Diagnosis not present

## 2022-03-15 DIAGNOSIS — H35371 Puckering of macula, right eye: Secondary | ICD-10-CM | POA: Diagnosis not present

## 2022-03-15 DIAGNOSIS — E113513 Type 2 diabetes mellitus with proliferative diabetic retinopathy with macular edema, bilateral: Secondary | ICD-10-CM | POA: Diagnosis not present

## 2022-03-25 ENCOUNTER — Emergency Department (HOSPITAL_BASED_OUTPATIENT_CLINIC_OR_DEPARTMENT_OTHER): Payer: BC Managed Care – PPO

## 2022-03-25 ENCOUNTER — Inpatient Hospital Stay (HOSPITAL_BASED_OUTPATIENT_CLINIC_OR_DEPARTMENT_OTHER)
Admission: EM | Admit: 2022-03-25 | Discharge: 2022-03-28 | DRG: 074 | Disposition: A | Discharge status: Home / Self Care | Payer: BC Managed Care – PPO | Attending: Family Medicine | Admitting: Family Medicine

## 2022-03-25 ENCOUNTER — Other Ambulatory Visit: Payer: Self-pay

## 2022-03-25 ENCOUNTER — Encounter (HOSPITAL_BASED_OUTPATIENT_CLINIC_OR_DEPARTMENT_OTHER): Payer: Self-pay

## 2022-03-25 ENCOUNTER — Emergency Department (HOSPITAL_BASED_OUTPATIENT_CLINIC_OR_DEPARTMENT_OTHER): Payer: BC Managed Care – PPO | Admitting: Radiology

## 2022-03-25 ENCOUNTER — Encounter (HOSPITAL_COMMUNITY): Payer: Self-pay

## 2022-03-25 DIAGNOSIS — Z823 Family history of stroke: Secondary | ICD-10-CM | POA: Diagnosis not present

## 2022-03-25 DIAGNOSIS — R1110 Vomiting, unspecified: Secondary | ICD-10-CM | POA: Diagnosis not present

## 2022-03-25 DIAGNOSIS — I5042 Chronic combined systolic (congestive) and diastolic (congestive) heart failure: Secondary | ICD-10-CM | POA: Diagnosis not present

## 2022-03-25 DIAGNOSIS — E1043 Type 1 diabetes mellitus with diabetic autonomic (poly)neuropathy: Principal | ICD-10-CM | POA: Diagnosis present

## 2022-03-25 DIAGNOSIS — K209 Esophagitis, unspecified without bleeding: Secondary | ICD-10-CM | POA: Diagnosis not present

## 2022-03-25 DIAGNOSIS — E1022 Type 1 diabetes mellitus with diabetic chronic kidney disease: Secondary | ICD-10-CM | POA: Diagnosis not present

## 2022-03-25 DIAGNOSIS — I5043 Acute on chronic combined systolic (congestive) and diastolic (congestive) heart failure: Secondary | ICD-10-CM | POA: Diagnosis not present

## 2022-03-25 DIAGNOSIS — Z8249 Family history of ischemic heart disease and other diseases of the circulatory system: Secondary | ICD-10-CM

## 2022-03-25 DIAGNOSIS — R112 Nausea with vomiting, unspecified: Secondary | ICD-10-CM | POA: Diagnosis not present

## 2022-03-25 DIAGNOSIS — E1069 Type 1 diabetes mellitus with other specified complication: Secondary | ICD-10-CM | POA: Diagnosis present

## 2022-03-25 DIAGNOSIS — Z818 Family history of other mental and behavioral disorders: Secondary | ICD-10-CM

## 2022-03-25 DIAGNOSIS — N184 Chronic kidney disease, stage 4 (severe): Secondary | ICD-10-CM | POA: Diagnosis not present

## 2022-03-25 DIAGNOSIS — J811 Chronic pulmonary edema: Secondary | ICD-10-CM | POA: Diagnosis not present

## 2022-03-25 DIAGNOSIS — F419 Anxiety disorder, unspecified: Secondary | ICD-10-CM | POA: Diagnosis present

## 2022-03-25 DIAGNOSIS — I469 Cardiac arrest, cause unspecified: Secondary | ICD-10-CM | POA: Diagnosis not present

## 2022-03-25 DIAGNOSIS — Z833 Family history of diabetes mellitus: Secondary | ICD-10-CM | POA: Diagnosis not present

## 2022-03-25 DIAGNOSIS — Z7984 Long term (current) use of oral hypoglycemic drugs: Secondary | ICD-10-CM

## 2022-03-25 DIAGNOSIS — E861 Hypovolemia: Secondary | ICD-10-CM | POA: Diagnosis not present

## 2022-03-25 DIAGNOSIS — E1040 Type 1 diabetes mellitus with diabetic neuropathy, unspecified: Secondary | ICD-10-CM | POA: Diagnosis not present

## 2022-03-25 DIAGNOSIS — I5021 Acute systolic (congestive) heart failure: Secondary | ICD-10-CM | POA: Diagnosis not present

## 2022-03-25 DIAGNOSIS — E876 Hypokalemia: Secondary | ICD-10-CM | POA: Diagnosis not present

## 2022-03-25 DIAGNOSIS — Z79899 Other long term (current) drug therapy: Secondary | ICD-10-CM

## 2022-03-25 DIAGNOSIS — Z888 Allergy status to other drugs, medicaments and biological substances status: Secondary | ICD-10-CM | POA: Diagnosis not present

## 2022-03-25 DIAGNOSIS — Z809 Family history of malignant neoplasm, unspecified: Secondary | ICD-10-CM | POA: Diagnosis not present

## 2022-03-25 DIAGNOSIS — E1122 Type 2 diabetes mellitus with diabetic chronic kidney disease: Secondary | ICD-10-CM | POA: Diagnosis not present

## 2022-03-25 DIAGNOSIS — I130 Hypertensive heart and chronic kidney disease with heart failure and stage 1 through stage 4 chronic kidney disease, or unspecified chronic kidney disease: Secondary | ICD-10-CM | POA: Diagnosis not present

## 2022-03-25 DIAGNOSIS — N179 Acute kidney failure, unspecified: Secondary | ICD-10-CM | POA: Diagnosis present

## 2022-03-25 DIAGNOSIS — R000 Tachycardia, unspecified: Secondary | ICD-10-CM | POA: Diagnosis not present

## 2022-03-25 DIAGNOSIS — Z9641 Presence of insulin pump (external) (internal): Secondary | ICD-10-CM | POA: Diagnosis present

## 2022-03-25 DIAGNOSIS — Z794 Long term (current) use of insulin: Secondary | ICD-10-CM | POA: Diagnosis not present

## 2022-03-25 DIAGNOSIS — K3184 Gastroparesis: Secondary | ICD-10-CM | POA: Diagnosis present

## 2022-03-25 DIAGNOSIS — Z7982 Long term (current) use of aspirin: Secondary | ICD-10-CM

## 2022-03-25 DIAGNOSIS — E1143 Type 2 diabetes mellitus with diabetic autonomic (poly)neuropathy: Secondary | ICD-10-CM | POA: Diagnosis present

## 2022-03-25 DIAGNOSIS — Z8349 Family history of other endocrine, nutritional and metabolic diseases: Secondary | ICD-10-CM

## 2022-03-25 DIAGNOSIS — F32A Depression, unspecified: Secondary | ICD-10-CM | POA: Diagnosis present

## 2022-03-25 DIAGNOSIS — N1832 Chronic kidney disease, stage 3b: Secondary | ICD-10-CM | POA: Diagnosis not present

## 2022-03-25 LAB — I-STAT VENOUS BLOOD GAS, ED
Acid-Base Excess: 3 mmol/L — ABNORMAL HIGH (ref 0.0–2.0)
Bicarbonate: 27.5 mmol/L (ref 20.0–28.0)
Calcium, Ion: 1.18 mmol/L (ref 1.15–1.40)
HCT: 31 % — ABNORMAL LOW (ref 36.0–46.0)
Hemoglobin: 10.5 g/dL — ABNORMAL LOW (ref 12.0–15.0)
O2 Saturation: 54 %
Potassium: 4.7 mmol/L (ref 3.5–5.1)
Sodium: 141 mmol/L (ref 135–145)
TCO2: 29 mmol/L (ref 22–32)
pCO2, Ven: 40 mmHg — ABNORMAL LOW (ref 44–60)
pH, Ven: 7.444 — ABNORMAL HIGH (ref 7.25–7.43)
pO2, Ven: 28 mmHg — CL (ref 32–45)

## 2022-03-25 LAB — CBG MONITORING, ED
Glucose-Capillary: 295 mg/dL — ABNORMAL HIGH (ref 70–99)
Glucose-Capillary: 249 mg/dL — ABNORMAL HIGH (ref 70–99)

## 2022-03-25 LAB — COMPREHENSIVE METABOLIC PANEL
ALT: 7 U/L (ref 0–44)
AST: 10 U/L — ABNORMAL LOW (ref 15–41)
Albumin: 3.4 g/dL — ABNORMAL LOW (ref 3.5–5.0)
Alkaline Phosphatase: 80 U/L (ref 38–126)
Anion gap: 8 (ref 5–15)
BUN: 21 mg/dL — ABNORMAL HIGH (ref 6–20)
CO2: 27 mmol/L (ref 22–32)
Calcium: 8.6 mg/dL — ABNORMAL LOW (ref 8.9–10.3)
Chloride: 107 mmol/L (ref 98–111)
Creatinine, Ser: 2.13 mg/dL — ABNORMAL HIGH (ref 0.44–1.00)
GFR, Estimated: 28 mL/min — ABNORMAL LOW (ref >60)
Glucose, Bld: 295 mg/dL — ABNORMAL HIGH (ref 70–99)
Potassium: 4.1 mmol/L (ref 3.5–5.1)
Sodium: 142 mmol/L (ref 135–145)
Total Bilirubin: 1 mg/dL (ref 0.3–1.2)
Total Protein: 6.7 g/dL (ref 6.5–8.1)

## 2022-03-25 LAB — URINALYSIS, ROUTINE W REFLEX MICROSCOPIC
Bilirubin Urine: NEGATIVE
Glucose, UA: 500 mg/dL — AB
Ketones, ur: NEGATIVE mg/dL
Leukocytes,Ua: NEGATIVE
Nitrite: NEGATIVE
Protein, ur: >300 mg/dL — AB
Specific Gravity, Urine: 1.011 (ref 1.005–1.030)
pH: 8 (ref 5.0–8.0)

## 2022-03-25 LAB — CBC WITH DIFFERENTIAL/PLATELET
Abs Immature Granulocytes: 0.03 10*3/uL (ref 0.00–0.07)
Basophils Absolute: 0 10*3/uL (ref 0.0–0.1)
Basophils Relative: 0 %
Eosinophils Absolute: 0 10*3/uL (ref 0.0–0.5)
Eosinophils Relative: 0 %
HCT: 32.7 % — ABNORMAL LOW (ref 36.0–46.0)
Hemoglobin: 10.7 g/dL — ABNORMAL LOW (ref 12.0–15.0)
Immature Granulocytes: 0 %
Lymphocytes Relative: 19 %
Lymphs Abs: 1.5 10*3/uL (ref 0.7–4.0)
MCH: 27.8 pg (ref 26.0–34.0)
MCHC: 32.7 g/dL (ref 30.0–36.0)
MCV: 84.9 fL (ref 80.0–100.0)
Monocytes Absolute: 0.2 10*3/uL (ref 0.1–1.0)
Monocytes Relative: 3 %
Neutro Abs: 6.2 10*3/uL (ref 1.7–7.7)
Neutrophils Relative %: 78 %
Platelets: 334 10*3/uL (ref 150–400)
RBC: 3.85 MIL/uL — ABNORMAL LOW (ref 3.87–5.11)
RDW: 14.6 % (ref 11.5–15.5)
WBC: 8 10*3/uL (ref 4.0–10.5)
nRBC: 0 % (ref 0.0–0.2)

## 2022-03-25 LAB — LACTIC ACID, PLASMA
Lactic Acid, Venous: 1.1 mmol/L (ref 0.5–1.9)
Lactic Acid, Venous: 0.9 mmol/L (ref 0.5–1.9)

## 2022-03-25 LAB — BETA-HYDROXYBUTYRIC ACID: Beta-Hydroxybutyric Acid: 0.13 mmol/L (ref 0.05–0.27)

## 2022-03-25 LAB — HEMOGLOBIN A1C
Hgb A1c MFr Bld: 6.3 % — ABNORMAL HIGH (ref 4.8–5.6)
Mean Plasma Glucose: 134.11 mg/dL

## 2022-03-25 LAB — GLUCOSE, CAPILLARY
Glucose-Capillary: 151 mg/dL — ABNORMAL HIGH (ref 70–99)
Glucose-Capillary: 118 mg/dL — ABNORMAL HIGH (ref 70–99)

## 2022-03-25 LAB — BRAIN NATRIURETIC PEPTIDE: B Natriuretic Peptide: 709.8 pg/mL — ABNORMAL HIGH (ref 0.0–100.0)

## 2022-03-25 MED ORDER — SODIUM CHLORIDE 0.9 % IV SOLN
25.0000 mg | Freq: Once | INTRAVENOUS | Status: AC
  Administered 2022-03-25: 25 mg via INTRAVENOUS
  Filled 2022-03-25: qty 1

## 2022-03-25 MED ORDER — ALBUTEROL SULFATE HFA 108 (90 BASE) MCG/ACT IN AERS: 2.0000 | INHALATION_SPRAY | Freq: Four times a day (QID) | RESPIRATORY_TRACT | Status: DC | PRN

## 2022-03-25 MED ORDER — DIPHENHYDRAMINE HCL 50 MG/ML IJ SOLN
25.0000 mg | Freq: Once | INTRAMUSCULAR | Status: AC
  Administered 2022-03-25: 25 mg via INTRAVENOUS
  Filled 2022-03-25: qty 1

## 2022-03-25 MED ORDER — ALBUTEROL SULFATE (2.5 MG/3ML) 0.083% IN NEBU
2.5000 mg | INHALATION_SOLUTION | Freq: Four times a day (QID) | RESPIRATORY_TRACT | Status: DC | PRN
Start: 2022-03-25 — End: 2022-03-28

## 2022-03-25 MED ORDER — ONDANSETRON HCL 4 MG/2ML IJ SOLN
4.0000 mg | Freq: Once | INTRAMUSCULAR | Status: AC
  Administered 2022-03-25: 4 mg via INTRAVENOUS
  Filled 2022-03-25: qty 2

## 2022-03-25 MED ORDER — PREGABALIN 50 MG PO CAPS: 50.0000 mg | ORAL_CAPSULE | Freq: Two times a day (BID) | ORAL | Status: DC | PRN

## 2022-03-25 MED ORDER — SODIUM CHLORIDE 0.9 % IV SOLN
Freq: Once | INTRAVENOUS | Status: DC
Start: 2022-03-25 — End: 2022-03-25

## 2022-03-25 MED ORDER — SODIUM CHLORIDE 0.9 % IV BOLUS
1000.0000 mL | Freq: Once | INTRAVENOUS | Status: AC
  Administered 2022-03-25: 1000 mL via INTRAVENOUS

## 2022-03-25 MED ORDER — HEPARIN SODIUM (PORCINE) 5000 UNIT/ML IJ SOLN
5000.0000 [IU] | Freq: Three times a day (TID) | INTRAMUSCULAR | Status: DC
Start: 2022-03-25 — End: 2022-03-28
  Administered 2022-03-25 – 2022-03-28 (×8): 5000 [IU] via SUBCUTANEOUS
  Filled 2022-03-25 (×8): qty 1

## 2022-03-25 MED ORDER — INSULIN GLARGINE-YFGN 100 UNIT/ML ~~LOC~~ SOLN
50.0000 [IU] | Freq: Every day | SUBCUTANEOUS | Status: DC
  Administered 2022-03-26 – 2022-03-27 (×2): 50 [IU] via SUBCUTANEOUS
  Filled 2022-03-25 (×3): qty 0.5

## 2022-03-25 MED ORDER — PANTOPRAZOLE SODIUM 40 MG IV SOLR
40.0000 mg | INTRAVENOUS | Status: DC
  Administered 2022-03-25 – 2022-03-26 (×2): 40 mg via INTRAVENOUS
  Filled 2022-03-25 (×2): qty 10

## 2022-03-25 MED ORDER — INSULIN ASPART 100 UNIT/ML IJ SOLN: 0.0000 [IU] | Freq: Every day | INTRAMUSCULAR | Status: DC

## 2022-03-25 MED ORDER — ACETAMINOPHEN 325 MG PO TABS
650.0000 mg | ORAL_TABLET | Freq: Four times a day (QID) | ORAL | Status: DC | PRN
  Administered 2022-03-26: 650 mg via ORAL
  Filled 2022-03-25: qty 2

## 2022-03-25 MED ORDER — PROMETHAZINE HCL 25 MG/ML IJ SOLN
INTRAMUSCULAR | Status: AC
  Filled 2022-03-25: qty 1

## 2022-03-25 MED ORDER — PROCHLORPERAZINE EDISYLATE 10 MG/2ML IJ SOLN
10.0000 mg | Freq: Four times a day (QID) | INTRAMUSCULAR | Status: DC | PRN
  Administered 2022-03-27: 10 mg via INTRAVENOUS
  Filled 2022-03-25: qty 2

## 2022-03-25 MED ORDER — SODIUM CHLORIDE 0.45 % IV SOLN: INTRAVENOUS | Status: DC

## 2022-03-25 MED ORDER — ACETAMINOPHEN 500 MG PO TABS: 500.0000 mg | ORAL_TABLET | Freq: Four times a day (QID) | ORAL | Status: DC | PRN

## 2022-03-25 MED ORDER — PROCHLORPERAZINE EDISYLATE 10 MG/2ML IJ SOLN
10.0000 mg | Freq: Once | INTRAMUSCULAR | Status: AC
  Administered 2022-03-25: 10 mg via INTRAVENOUS
  Filled 2022-03-25: qty 2

## 2022-03-25 MED ORDER — FUROSEMIDE 10 MG/ML IJ SOLN
40.0000 mg | Freq: Once | INTRAMUSCULAR | Status: AC
  Administered 2022-03-25: 40 mg via INTRAVENOUS
  Filled 2022-03-25: qty 4

## 2022-03-25 MED ORDER — ACETAMINOPHEN 650 MG RE SUPP: 650.0000 mg | Freq: Four times a day (QID) | RECTAL | Status: DC | PRN

## 2022-03-25 MED ORDER — LABETALOL HCL 5 MG/ML IV SOLN
10.0000 mg | Freq: Once | INTRAVENOUS | Status: AC
  Administered 2022-03-25: 10 mg via INTRAVENOUS
  Filled 2022-03-25: qty 4

## 2022-03-25 MED ORDER — HYDROXYZINE HCL 50 MG PO TABS: 50.0000 mg | ORAL_TABLET | Freq: Three times a day (TID) | ORAL | Status: DC | PRN

## 2022-03-25 MED ORDER — INSULIN ASPART 100 UNIT/ML IJ SOLN
0.0000 [IU] | Freq: Three times a day (TID) | INTRAMUSCULAR | Status: DC
  Administered 2022-03-26: 1 [IU] via SUBCUTANEOUS

## 2022-03-25 MED ORDER — POLYETHYLENE GLYCOL 3350 17 G PO PACK: 17.0000 g | PACK | Freq: Every day | ORAL | Status: DC | PRN

## 2022-03-25 MED ORDER — CARVEDILOL 12.5 MG PO TABS
12.5000 mg | ORAL_TABLET | Freq: Two times a day (BID) | ORAL | Status: DC
  Administered 2022-03-25 – 2022-03-28 (×7): 12.5 mg via ORAL
  Filled 2022-03-25 (×7): qty 1

## 2022-03-25 NOTE — ED Triage Notes (Signed)
Diabetic  present with vomiting  States sugar is high

## 2022-03-25 NOTE — H&P (Signed)
History and Physical    Etty Isaac IYM:[id number] DOB: Oct 03, 1970 DOA: 03/25/2022  PCP: [name], PA  Patient coming from: Home  I have personally briefly reviewed patient's old medical records in [place]  Chief Complaint: Nausea and vomiting  HPI: Breanda Greenlaw is a 51 y.o. 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  Symptoms were associated with headache and generalized weakness.  She was only vomiting approximately 1 time a day, but over the past few days this is significantly worsened to vomiting with any p.o. intake.  Her bowel movements have been at her baseline and she has been going once every other day.  She has not had any fevers.  She does feel more short of breath lately.  She endorses orthopnea as well as dyspnea on exertion.  She has not had any cough or chest pain.  She is not on any medications.  She has not had any sick contacts.  She did not have any sore throat or congestion.  She does not have any dysuria.  She feels that her chronic lower extremity edema has been better than it has been in the past.  She noticed that her blood sugars were dropping to the 40s and 50s since her p.o. intake was poor.  She subsequently discontinued her insulin pump, but did take a dose of Tresiba prior to coming to the emergency room.  ED Course: In the emergency room, labs were checked unfortunately, did not appear that she was in DKA.  Chest x-ray did show some evidence of interstitial edema.  BNP elevated above baseline.  Creatinine appears to be elevated above baseline as well.  CBC unremarkable.  Review of Systems: As per HPI otherwise 10 point review of systems negative.    Past Medical History:  Diagnosis Date   Anemia    Anxiety    Arthritis     Asthma    CHF (congestive heart failure) ([place])    Depression    History of chicken pox    Migraines    Mood disorder (HCC)    anxiety   Prurigo nodularis    with diabetic dermopathy   Type 1 diabetes, uncontrolled, with neuropathy    [place]    Past Surgical History:  Procedure Laterality Date   BIOPSY  08/11/2020   Procedure: BIOPSY;  Surgeon: Ladene Artist, MD;  Location: [facility];  Service: Endoscopy;;   ESOPHAGOGASTRODUODENOSCOPY (EGD) WITH PROPOFOL N/A 08/11/2020   Procedure: ESOPHAGOGASTRODUODENOSCOPY (EGD) WITH PROPOFOL;  Surgeon: Ladene Artist, MD;  Location: [facility];  Service: Endoscopy;  Laterality: N/A;   treadmill stress test  01/2013   WNL, low risk study    Social History:  reports that she has never smoked. She has never used smokeless tobacco. She reports that she does not currently use alcohol. She reports that she does not use drugs.  Allergies  Allergen Reactions   Amoxicillin Itching   Atorvastatin     Dizziness    Dulaglutide Nausea And Vomiting   Lantus [Insulin Glargine] Itching and Rash   Miconazole Nitrate Rash    Family History  Problem Relation Age of Onset   Diabetes Mother        type 2   Hypertension Mother    Thyroid disease Mother  Bipolar disorder Mother    Heart disease Mother    Calcium disorder Mother    Cancer Maternal Grandmother        Breast, stomach   Cancer Paternal Grandmother        stomach, lung (smoker)   Diabetes Paternal Grandmother    Diabetes Paternal Grandfather    Heart failure Sister    Diabetes Sister    Stroke Maternal Aunt    Cancer Maternal Uncle        prostate   CAD Maternal Aunt        stents   Cancer Maternal Aunt 64       ovarian     Prior to Admission medications   Medication Sig Start Date End Date Taking? Authorizing Provider  acetaminophen (TYLENOL) 500 MG tablet Take 500 mg by mouth every 6 (six) hours as needed for moderate pain.   Yes [provider]  albuterol  (PROAIR HFA) 108 (90 Base) MCG/ACT inhaler Inhale 2 puffs into the lungs every 6 (six) hours as needed for wheezing or shortness of breath. 07/03/20  Yes [name], PA-C  aspirin-sod bicarb-citric acid (ALKA-SELTZER) 325 MG TBEF tablet Take 650 mg by mouth every 6 (six) hours as needed (indigestion).   Yes [provider]  Carboxymethylcellul-Glycerin (LUBRICATING EYE DROPS OP) Place 1 drop into both eyes daily as needed (dry eyes).   Yes [provider]  carvedilol (COREG) 12.5 MG tablet Take 1 tablet (12.5 mg total) by mouth 2 (two) times daily. 11/28/21  Yes [name], MD  feeding supplement, GLUCERNA SHAKE, (GLUCERNA SHAKE) LIQD Take 237 mLs by mouth 2 (two) times daily between meals. Patient taking differently: Take 237 mLs by mouth 2 (two) times daily as needed (nutrition). 08/16/20  Yes Mariel Aloe, MD  hydrOXYzine (ATARAX) 50 MG tablet Take 50 mg by mouth in the morning, at noon, and at bedtime. 10/01/21  Yes [provider]  JARDIANCE 10 MG TABS tablet Take 10 mg by mouth daily. 02/20/22  Yes [provider]  lisinopril (ZESTRIL) 5 MG tablet Take 5 mg by mouth daily. 03/24/22  Yes [provider]  NOVOLOG 100 UNIT/ML injection Inject 0-120 Units into the skin daily. 04/06/20  Yes [provider]  polyethylene glycol (MIRALAX / GLYCOLAX) 17 g packet Take 17 g by mouth daily as needed for moderate constipation.   Yes [provider]  pregabalin (LYRICA) 50 MG capsule Take 50 mg by mouth 2 (two) times daily as needed (neuropathy). 12/05/21  Yes [provider]  tobramycin (TOBREX) 0.3 % ophthalmic solution Place 1 drop into both eyes See admin instructions. Instill 1-2 drop into both eyes 3 times daily the day before, of, and the day after monthly eye injections 11/17/20  Yes [provider]  torsemide (DEMADEX) 20 MG tablet Take 20 mg by mouth daily as needed (swelling). 12/25/21  Yes [provider]   TRESIBA FLEXTOUCH 200 UNIT/ML FlexTouch Pen Inject 50 Units into the skin daily as needed (when insulin pump stops working). 11/04/21  Yes [provider]  Continuous Blood Gluc Sensor (FREESTYLE LIBRE 2 SENSOR) MISC CHANGE EVERY 14 DAYS 03/20/20   [provider]  Insulin Disposable Pump (OMNIPOD 10 PACK) MISC by Does not apply route.    [provider]    Physical Exam: Vitals:   03/25/22 1600 03/25/22 1630 03/25/22 1653 03/25/22 1737  BP: 139/82 136/75  (!) 142/85  Pulse: 84 81 85 77  Resp: 20  $'17 16 16  'T$ Temp:    97.9 F (36.6 C)  TempSrc:    Oral  SpO2: 91% 91% 95% 100%  Weight:      Height:        Constitutional: NAD, calm, comfortable Eyes: PERRL, lids and conjunctivae normal ENMT: Mucous membranes are moist. Posterior pharynx clear of any exudate or lesions.Normal dentition.  Neck: normal, supple, no masses, no thyromegaly Respiratory: clear to auscultation bilaterally, no wheezing, no crackles. Normal respiratory effort. No accessory muscle use.  Cardiovascular: Regular rate and rhythm, no murmurs / rubs / gallops. No extremity edema. 2+ pedal pulses. No carotid bruits.  Abdomen: no tenderness, no masses palpated. No hepatosplenomegaly. Bowel sounds positive.  Musculoskeletal: no clubbing / cyanosis. No joint deformity upper and lower extremities. Good ROM, no contractures. Normal muscle tone.  Skin: no rashes, lesions, ulcers. No induration Neurologic: CN 2-12 grossly intact. Sensation intact, DTR normal. Strength 5/5 in all 4.  Psychiatric: Normal judgment and insight. Alert and oriented x 3. Normal mood.    Labs on Admission: I have personally reviewed following labs and imaging studies  CBC: Recent Labs  Lab 03/25/22 0900 03/25/22 0950  WBC 8.0  --   NEUTROABS 6.2  --   HGB 10.7* 10.5*  HCT 32.7* 31.0*  MCV 84.9  --   PLT 334  --    Basic Metabolic Panel: Recent Labs  Lab 03/25/22 0900 03/25/22 0950  NA 142 141  K 4.1 4.7  CL  107  --   CO2 27  --   GLUCOSE 295*  --   BUN 21*  --   CREATININE 2.13*  --   CALCIUM 8.6*  --    GFR: Estimated Creatinine Clearance: 36.1 mL/min (A) (by C-G formula based on SCr of 2.13 mg/dL (H)). Liver Function Tests: Recent Labs  Lab 03/25/22 0900  AST 10*  ALT 7  ALKPHOS 80  BILITOT 1.0  PROT 6.7  ALBUMIN 3.4*   No results for input(s): "LIPASE", "AMYLASE" in the last 168 hours. No results for input(s): "AMMONIA" in the last 168 hours. Coagulation Profile: No results for input(s): "INR", "PROTIME" in the last 168 hours. Cardiac Enzymes: No results for input(s): "CKTOTAL", "CKMB", "CKMBINDEX", "TROPONINI" in the last 168 hours. BNP (last 3 results) No results for input(s): "PROBNP" in the last 8760 hours. HbA1C: No results for input(s): "HGBA1C" in the last 72 hours. CBG: Recent Labs  Lab 03/25/22 0858 03/25/22 1039 03/25/22 1738  GLUCAP 295* 249* 151*   Lipid Profile: No results for input(s): "CHOL", "HDL", "LDLCALC", "TRIG", "CHOLHDL", "LDLDIRECT" in the last 72 hours. Thyroid Function Tests: No results for input(s): "TSH", "T4TOTAL", "FREET4", "T3FREE", "THYROIDAB" in the last 72 hours. Anemia Panel: No results for input(s): "VITAMINB12", "FOLATE", "FERRITIN", "TIBC", "IRON", "RETICCTPCT" in the last 72 hours. Urine analysis:    Component Value Date/Time   COLORURINE YELLOW 03/25/2022 1110   APPEARANCEUR HAZY (A) 03/25/2022 1110   LABSPEC 1.011 03/25/2022 1110   PHURINE 8.0 03/25/2022 1110   GLUCOSEU 500 (A) 03/25/2022 1110   HGBUR SMALL (A) 03/25/2022 1110   BILIRUBINUR NEGATIVE 03/25/2022 1110   BILIRUBINUR negative 08/30/2020 1631   KETONESUR NEGATIVE 03/25/2022 1110   PROTEINUR >300 (A) 03/25/2022 1110   UROBILINOGEN 0.2 08/30/2020 1631   UROBILINOGEN 1.0 08/11/2008 1745   NITRITE NEGATIVE 03/25/2022 1110   LEUKOCYTESUR NEGATIVE 03/25/2022 1110    Radiological Exams on Admission: DG Chest Port 1 View  Result Date: 03/25/2022 CLINICAL  DATA:  Vomiting. EXAM: PORTABLE  CHEST 1 VIEW COMPARISON:  08/25/2020 FINDINGS: The cardio pericardial silhouette is enlarged. There is pulmonary vascular congestion without overt pulmonary edema. Probable atelectasis left base with likely tiny bilateral pleural effusions. Telemetry leads overlie the chest. IMPRESSION: 1. Enlargement of the cardiopericardial silhouette with pulmonary vascular congestion. 2. Probable tiny bilateral pleural effusions. Electronically Signed   By: Misty Stanley M.D.   On: 03/25/2022 10:18    EKG: Independently reviewed.  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 intake is improved, anticipate transition back to insulin pump -Check A1c -Continue on sliding scale insulin -Fortunately does not appear to be in DKA  Acute kidney injury on chronic kidney disease stage IIIb -Baseline creatinine appears to be around 1.7 -Creatinine on admission 2.1 -Avoid aggressive hydration in light of concerns for volume overload  Acute on chronic combined CHF -Update echocardiogram -We will give 1 dose of Lasix today -She is also on beta-blockers -Resume Jardiance on discharge -Cannot use ARB due to renal dysfunction  DVT prophylaxis: Heparin Code Status: Full code Family  Communication: Discussed with patient Disposition Plan: Discharge home once tolerating p.o. Consults called:   Admission status: Observation, telemetry  Kathie Dike MD [facility]   If 7PM-7AM, please contact night-coverage [email or web address]   03/25/2022, 7:30 PM

## 2022-03-25 NOTE — ED Notes (Signed)
Handoff report given to [facility] RN On 4 E at [facility]

## 2022-03-25 NOTE — Progress Notes (Signed)
Plan of Care Note for accepted transfer   Patient: [name] MRN: [id number]   [employer]: 03/25/2022  Facility requesting transfer: [facility] Requesting Provider: Lavonna Rua, PA-C/Scott Zackowski MD Reason for transfer: AKI, N/V. Facility course:  Per EDP: " Chief Complaint  Patient presents with   Emesis     [name] is a 51 y.o. female.   Patient with history of T1DM, CKD, gastroparesis presents today with complaints of nausea and vomiting.  She states that same began initially on Tuesday with some mild nausea and occasional vomiting.  States that she has gotten progressively worse since then and is now unable to tolerate any oral intake.  She states that she was concerned that her blood sugar would drop too low from not eating and therefore she removed her insulin pump this morning.  She states that this feels similarly to both her historical flares of gastroparesis and DKA, she states that she is not on medication for gastroparesis because she was concerned about the side effects.  Endorses some generalized abdominal pain throughout.  Denies fevers, chills, hematuria, dysuria."  Lab work:  Beta-hydroxybutyric acid [[id number]]   Collected: 03/25/22 0900   Updated: 03/25/22 1234   Specimen Type: Blood   Specimen Source: Vein    Beta-Hydroxybutyric Acid 0.13 mmol/L  Lactic acid, plasma [[id number]]   Collected: 03/25/22 1114   Updated: 03/25/22 1159   Specimen Type: Blood   Specimen Source: Vein    Lactic Acid, Venous 0.9 mmol/L  Urinalysis, Routine w reflex microscopic Urine, Clean Catch [[id number]] (Abnormal)   Collected: 03/25/22 1110   Updated: 03/25/22 1154   Specimen Source: Urine, Clean Catch    Color, Urine YELLOW   APPearance HAZY Abnormal    Specific Gravity, Urine 1.011   pH 8.0   Glucose, UA 500 Abnormal  mg/dL   Hgb urine dipstick SMALL Abnormal    Bilirubin Urine NEGATIVE   Ketones, ur NEGATIVE mg/dL   Protein, ur >300 Abnormal  mg/dL   Nitrite NEGATIVE    Leukocytes,Ua NEGATIVE   RBC / HPF 0-5 RBC/hpf   WBC, UA 0-5 WBC/hpf   Bacteria, UA MANY Abnormal    Squamous Epithelial / LPF 11-20  CBG monitoring, ED [[id number]] (Abnormal)   Collected: 03/25/22 1039   Updated: 03/25/22 1047    Glucose-Capillary 249 High  mg/dL  Comprehensive metabolic panel [[id number]] (Abnormal)   Collected: 03/25/22 0900   Updated: 03/25/22 1014   Specimen Type: Blood   Specimen Source: Vein    Sodium 142 mmol/L   Potassium 4.1 mmol/L   Chloride 107 mmol/L   CO2 27 mmol/L   Glucose, Bld 295 High  mg/dL   BUN 21 High  mg/dL   Creatinine, Ser 2.13 High  mg/dL   Calcium 8.6 Low  mg/dL   Total Protein 6.7 g/dL   Albumin 3.4 Low  g/dL   AST 10 Low  U/L   ALT 7 U/L   Alkaline Phosphatase 80 U/L   Total Bilirubin 1.0 mg/dL   GFR, Estimated 28 Low  mL/min   Anion gap 8  Lactic acid, plasma [[id number]]   Collected: 03/25/22 0900   Updated: 03/25/22 1014   Specimen Type: Blood   Specimen Source: Vein    Lactic Acid, Venous 1.1 mmol/L  I-Stat venous blood gas, ED [[id number]] (Abnormal)   Collected: 03/25/22 0950   Updated: 03/25/22 0952    pH, Ven 7.444 High    pCO2, Ven 40.0 Low  mmHg   pO2, Ven 28 Low Panic  mmHg   Bicarbonate 27.5 mmol/L   TCO2 29 mmol/L   O2 Saturation 54 %   Acid-Base Excess 3.0 High  mmol/L   Sodium 141 mmol/L   Potassium 4.7 mmol/L   Calcium, Ion 1.18 mmol/L   HCT 31.0 Low  %   Hemoglobin 10.5 Low  g/dL   Collection site Brachial   Drawn by HIDE   Sample type VENOUS   Comment NOTIFIED PHYSICIAN  CBC with Differential [[id number]] (Abnormal)   Collected: 03/25/22 0900   Updated: 03/25/22 0937   Specimen Type: Blood   Specimen Source: Vein    WBC 8.0 K/uL   RBC 3.85 Low  MIL/uL   Hemoglobin 10.7 Low  g/dL   HCT 32.7 Low  %   MCV 84.9 fL   MCH 27.8 pg   MCHC 32.7 g/dL   RDW 14.6 %   Platelets 334 K/uL   nRBC 0.0 %   Neutrophils Relative % 78 %   Neutro Abs 6.2 K/uL   Lymphocytes Relative 19 %   Lymphs Abs 1.5  K/uL   Monocytes Relative 3 %   Monocytes Absolute 0.2 K/uL   Eosinophils Relative 0 %   Eosinophils Absolute 0.0 K/uL   Basophils Relative 0 %   Basophils Absolute 0.0 K/uL   Immature Granulocytes 0 %   Abs Immature Granulocytes 0.03 K/uL  CBG monitoring, ED [[id number]] (Abnormal)   Collected: 03/25/22 0858   Updated: 03/25/22 0859    Glucose-Capillary 295 High  mg/dL   Imaging: EXAM: PORTABLE CHEST 1 VIEW   COMPARISON:  08/25/2020   FINDINGS: The cardio pericardial silhouette is enlarged. There is pulmonary vascular congestion without overt pulmonary edema. Probable atelectasis left base with likely tiny bilateral pleural effusions. Telemetry leads overlie the chest.   IMPRESSION: 1. Enlargement of the cardiopericardial silhouette with pulmonary vascular congestion. 2. Probable tiny bilateral pleural effusions.     Electronically Signed   By: Misty Stanley M.D.   On: 03/25/2022 10:18  Plan of care: The patient is accepted for admission to Telemetry unit, at [facility]..   Author: Reubin Milan, MD 03/25/2022  Check [email or web address] for on-call coverage.  Nursing staff, Please call [place] number on Amion as soon as patient's arrival, so appropriate admitting provider can evaluate the pt.

## 2022-03-25 NOTE — ED Provider Notes (Signed)
[facility] EMERGENCY DEPT Provider Note   CSN: [id number] Arrival date & time: 03/25/22  0847     History  Chief Complaint  Patient presents with   Emesis    Juanice Warburton is a 51 y.o. female.  Patient with history of T1DM, CKD, gastroparesis presents today with complaints of nausea and vomiting.  She states that same began initially on Tuesday with some mild nausea and occasional vomiting.  States that she has gotten progressively worse since then and is now unable to tolerate any oral intake.  She states that she was concerned that her blood sugar would drop too low from not eating and therefore she removed her insulin pump this morning.  She states that this feels similarly to both her historical flares of gastroparesis and DKA, she states that she is not on medication for gastroparesis because she was concerned about the side effects.  Endorses some generalized abdominal pain throughout.  Denies fevers, chills, hematuria, dysuria.  The history is provided by the patient. No language interpreter was used.  Emesis      Home Medications Prior to Admission medications   Medication Sig Start Date End Date Taking? Authorizing Provider  acetaminophen (TYLENOL) 500 MG tablet Take 500 mg by mouth every 6 (six) hours as needed for moderate pain.    [provider]  albuterol (PROAIR HFA) 108 (90 Base) MCG/ACT inhaler Inhale 2 puffs into the lungs every 6 (six) hours as needed for wheezing or shortness of breath. 07/03/20   [name], PA-C  aspirin-sod bicarb-citric acid (ALKA-SELTZER) 325 MG TBEF tablet Take 650 mg by mouth every 6 (six) hours as needed (indigestion).    [provider]  Carboxymethylcellul-Glycerin (LUBRICATING EYE DROPS OP) Place 1 drop into both eyes daily as needed (dry eyes).    [provider]  carvedilol (COREG) 12.5 MG tablet Take 1 tablet (12.5 mg total) by mouth 2 (two) times daily. 11/28/21   [name], MD   Continuous Blood Gluc Sensor (FREESTYLE LIBRE 2 SENSOR) MISC CHANGE EVERY 14 DAYS 03/20/20   [provider]  feeding supplement, GLUCERNA SHAKE, (GLUCERNA SHAKE) LIQD Take 237 mLs by mouth 2 (two) times daily between meals. 08/16/20   Mariel Aloe, MD  hydrOXYzine (ATARAX) 50 MG tablet Take 50 mg by mouth 3 (three) times daily as needed for anxiety. 10/01/21   [provider]  Insulin Disposable Pump (OMNIPOD 10 PACK) MISC by Does not apply route.    [provider]  NOVOLOG 100 UNIT/ML injection Inject 0-120 Units into the skin daily. 04/06/20   [provider]  polyethylene glycol (MIRALAX / GLYCOLAX) 17 g packet Take 17 g by mouth daily as needed for moderate constipation.    [provider]  tobramycin (TOBREX) 0.3 % ophthalmic solution Place 1 drop into both eyes See admin instructions. Instill 1-2 drop into both eyes 3 times daily the day before, of, and the day after monthly eye injections 11/17/20   [provider]  TRESIBA FLEXTOUCH 200 UNIT/ML FlexTouch Pen Inject 36 Units into the skin daily as needed (when insulin pump stops working). 11/04/21   [provider]      Allergies    Amoxicillin, Atorvastatin, Dulaglutide, Lantus [insulin glargine], and Miconazole nitrate    Review of Systems   Review of Systems  Gastrointestinal:  Positive for nausea and vomiting.  All other systems reviewed and are negative.   Physical Exam Updated Vital Signs BP (!) 167/140 (BP Location:  Left Arm)   Pulse (!) 116   Temp (!) 96.8 F (36 C)   Resp (!) 30   Ht 6' (1.829 m)   Wt 77.1 kg   LMP 11/17/2021 (Approximate)   SpO2 100%   BMI 23.06 kg/m  Physical Exam Vitals and nursing note reviewed.  Constitutional:      General: She is not in acute distress.    Appearance: Normal appearance. She is normal weight. She is not ill-appearing, toxic-appearing or diaphoretic.  HENT:     Head: Normocephalic and atraumatic.  Cardiovascular:      Rate and Rhythm: Regular rhythm. Tachycardia present.  Pulmonary:     Effort: Pulmonary effort is normal. No respiratory distress.  Musculoskeletal:        General: Normal range of motion.     Cervical back: Normal range of motion.  Skin:    General: Skin is warm and dry.  Neurological:     General: No focal deficit present.     Mental Status: She is alert.  Psychiatric:        Mood and Affect: Mood normal.        Behavior: Behavior normal.     ED Results / Procedures / Treatments   Labs (all labs ordered are listed, but only abnormal results are displayed) Labs Reviewed  COMPREHENSIVE METABOLIC PANEL - Abnormal; Notable for the following components:      Result Value   Glucose, Bld 295 (*)    BUN 21 (*)    Creatinine, Ser 2.13 (*)    Calcium 8.6 (*)    Albumin 3.4 (*)    AST 10 (*)    GFR, Estimated 28 (*)    All other components within normal limits  CBC WITH DIFFERENTIAL/PLATELET - Abnormal; Notable for the following components:   RBC 3.85 (*)    Hemoglobin 10.7 (*)    HCT 32.7 (*)    All other components within normal limits  CBG MONITORING, ED - Abnormal; Notable for the following components:   Glucose-Capillary 295 (*)    All other components within normal limits  I-STAT VENOUS BLOOD GAS, ED - Abnormal; Notable for the following components:   pH, Ven 7.444 (*)    pCO2, Ven 40.0 (*)    pO2, Ven 28 (*)    Acid-Base Excess 3.0 (*)    HCT 31.0 (*)    Hemoglobin 10.5 (*)    All other components within normal limits  CBG MONITORING, ED - Abnormal; Notable for the following components:   Glucose-Capillary 249 (*)    All other components within normal limits  LACTIC ACID, PLASMA  LACTIC ACID, PLASMA  URINALYSIS, ROUTINE W REFLEX MICROSCOPIC  BLOOD GAS, VENOUS  BETA-HYDROXYBUTYRIC ACID    EKG None  Radiology DG Chest Port 1 View  Result Date: 03/25/2022 CLINICAL DATA:  Vomiting. EXAM: PORTABLE CHEST 1 VIEW COMPARISON:  08/25/2020 FINDINGS: The cardio  pericardial silhouette is enlarged. There is pulmonary vascular congestion without overt pulmonary edema. Probable atelectasis left base with likely tiny bilateral pleural effusions. Telemetry leads overlie the chest. IMPRESSION: 1. Enlargement of the cardiopericardial silhouette with pulmonary vascular congestion. 2. Probable tiny bilateral pleural effusions. Electronically Signed   By: Misty Stanley M.D.   On: 03/25/2022 10:18    Procedures Procedures    Medications Ordered in ED Medications  promethazine (PHENERGAN) 25 MG/ML injection (has no administration in time range)  ondansetron (ZOFRAN) injection 4 mg (4 mg Intravenous Given 03/25/22 0906)  sodium chloride 0.9 %  bolus 1,000 mL (1,000 mLs Intravenous New Bag/Given 03/25/22 0943)  promethazine (PHENERGAN) 25 mg in sodium chloride 0.9 % 50 mL IVPB (25 mg Intravenous New Bag/Given 03/25/22 0950)  prochlorperazine (COMPAZINE) injection 10 mg (10 mg Intravenous Given 03/25/22 1056)  diphenhydrAMINE (BENADRYL) injection 25 mg (25 mg Intravenous Given 03/25/22 1056)    ED Course/ Medical Decision Making/ A&P                           Medical Decision Making Amount and/or Complexity of Data Reviewed Labs: ordered. Radiology: ordered.  Risk OTC drugs. Prescription drug management. Decision regarding hospitalization.   This patient presents to the ED for concern of nausea and vomiting, this involves an extensive number of treatment options, and is a complaint that carries with it a high risk of complications and morbidity.  The differential diagnosis includes DKA, gastroparesis, sepsis. This is not an exhaustive differential   Co morbidities that complicate the patient evaluation  Hx DKA, T1DM, gastroparesis   Additional history obtained:  Additional history obtained from epic chart review   Lab Tests:  I Ordered, and personally interpreted labs.  The pertinent results include:  CBG 295, lactic 1.1, Creatinine 2.13 up from 1.76  4 months ago. GFR 28. Bicarb and anion gap. No leukocytosis, hemoglobin 10.7 consistent with previous   Imaging Studies ordered:  I ordered imaging studies including CXR  I independently visualized and interpreted imaging which showed  1. Enlargement of the cardiopericardial silhouette with pulmonary vascular congestion. 2. Probable tiny bilateral pleural effusions. I agree with the radiologist interpretation   Cardiac Monitoring: / EKG:  The patient was maintained on a cardiac monitor.  I personally viewed and interpreted the cardiac monitored which showed an underlying rhythm of: sinus tachycardia  Problem List / ED Course / Critical interventions / Medication management  AKI, gastroparesis I ordered medication including fluids, compazine, benadryl, zofran, and phenergan  for dehydration, nausea, and vomiting  Reevaluation of the patient after these medicines showed that the patient improved I have reviewed the patients home medicines and have made adjustments as needed  Test / Admission - Considered:  Patient presents today with nausea, vomiting, and abdominal pain.  She states her symptoms are consistent with when she has had flares of her gastroparesis in the past.  She is not currently on any medication for gastroparesis as she states that the side effects of those medications were too worrisome for her to take it regularly.  Laboratory evaluation shows an AKI with bump in creatinine and decreased GFR.  Also having difficulty controlling her nausea and vomiting despite several antiemetics.  She will therefore need admission for same.  Discussed this with the patient who is understanding and in agreement.  Discussed patient with hospitalist who agrees to admit   This is a shared visit with supervising physician Dr. Rogene Houston who has independently evaluated patient & provided guidance in evaluation/management/disposition, in agreement with care    Final Clinical Impression(s) /  ED Diagnoses Final diagnoses:  AKI (acute kidney injury) ([place])  Gastroparesis  Intractable nausea and vomiting    Rx / DC Orders ED Discharge Orders     None         Nestor Lewandowsky 03/25/22 1904    Fredia Sorrow, MD 03/29/22 [id number]

## 2022-03-26 ENCOUNTER — Inpatient Hospital Stay (HOSPITAL_COMMUNITY): Payer: BC Managed Care – PPO

## 2022-03-26 DIAGNOSIS — E1143 Type 2 diabetes mellitus with diabetic autonomic (poly)neuropathy: Secondary | ICD-10-CM | POA: Diagnosis not present

## 2022-03-26 DIAGNOSIS — Z7982 Long term (current) use of aspirin: Secondary | ICD-10-CM | POA: Diagnosis not present

## 2022-03-26 DIAGNOSIS — N179 Acute kidney failure, unspecified: Secondary | ICD-10-CM | POA: Diagnosis present

## 2022-03-26 DIAGNOSIS — E1022 Type 1 diabetes mellitus with diabetic chronic kidney disease: Secondary | ICD-10-CM | POA: Diagnosis present

## 2022-03-26 DIAGNOSIS — K3184 Gastroparesis: Secondary | ICD-10-CM | POA: Diagnosis present

## 2022-03-26 DIAGNOSIS — E861 Hypovolemia: Secondary | ICD-10-CM | POA: Diagnosis present

## 2022-03-26 DIAGNOSIS — Z8249 Family history of ischemic heart disease and other diseases of the circulatory system: Secondary | ICD-10-CM | POA: Diagnosis not present

## 2022-03-26 DIAGNOSIS — I5043 Acute on chronic combined systolic (congestive) and diastolic (congestive) heart failure: Secondary | ICD-10-CM | POA: Diagnosis not present

## 2022-03-26 DIAGNOSIS — I5042 Chronic combined systolic (congestive) and diastolic (congestive) heart failure: Secondary | ICD-10-CM | POA: Diagnosis present

## 2022-03-26 DIAGNOSIS — Z8349 Family history of other endocrine, nutritional and metabolic diseases: Secondary | ICD-10-CM | POA: Diagnosis not present

## 2022-03-26 DIAGNOSIS — R112 Nausea with vomiting, unspecified: Secondary | ICD-10-CM | POA: Diagnosis not present

## 2022-03-26 DIAGNOSIS — Z888 Allergy status to other drugs, medicaments and biological substances status: Secondary | ICD-10-CM | POA: Diagnosis not present

## 2022-03-26 DIAGNOSIS — Z833 Family history of diabetes mellitus: Secondary | ICD-10-CM | POA: Diagnosis not present

## 2022-03-26 DIAGNOSIS — Z818 Family history of other mental and behavioral disorders: Secondary | ICD-10-CM | POA: Diagnosis not present

## 2022-03-26 DIAGNOSIS — F419 Anxiety disorder, unspecified: Secondary | ICD-10-CM | POA: Diagnosis present

## 2022-03-26 DIAGNOSIS — Z794 Long term (current) use of insulin: Secondary | ICD-10-CM | POA: Diagnosis not present

## 2022-03-26 DIAGNOSIS — F32A Depression, unspecified: Secondary | ICD-10-CM | POA: Diagnosis present

## 2022-03-26 DIAGNOSIS — I5021 Acute systolic (congestive) heart failure: Secondary | ICD-10-CM | POA: Diagnosis not present

## 2022-03-26 DIAGNOSIS — E1043 Type 1 diabetes mellitus with diabetic autonomic (poly)neuropathy: Secondary | ICD-10-CM | POA: Diagnosis present

## 2022-03-26 DIAGNOSIS — Z79899 Other long term (current) drug therapy: Secondary | ICD-10-CM | POA: Diagnosis not present

## 2022-03-26 DIAGNOSIS — Z809 Family history of malignant neoplasm, unspecified: Secondary | ICD-10-CM | POA: Diagnosis not present

## 2022-03-26 DIAGNOSIS — Z9641 Presence of insulin pump (external) (internal): Secondary | ICD-10-CM | POA: Diagnosis present

## 2022-03-26 DIAGNOSIS — Z823 Family history of stroke: Secondary | ICD-10-CM | POA: Diagnosis not present

## 2022-03-26 DIAGNOSIS — Z7984 Long term (current) use of oral hypoglycemic drugs: Secondary | ICD-10-CM | POA: Diagnosis not present

## 2022-03-26 DIAGNOSIS — N1832 Chronic kidney disease, stage 3b: Secondary | ICD-10-CM | POA: Diagnosis present

## 2022-03-26 LAB — COMPREHENSIVE METABOLIC PANEL
ALT: 9 U/L (ref 0–44)
AST: 10 U/L — ABNORMAL LOW (ref 15–41)
Albumin: 2.4 g/dL — ABNORMAL LOW (ref 3.5–5.0)
Alkaline Phosphatase: 65 U/L (ref 38–126)
Anion gap: 6 (ref 5–15)
BUN: 22 mg/dL — ABNORMAL HIGH (ref 6–20)
CO2: 24 mmol/L (ref 22–32)
Calcium: 8.2 mg/dL — ABNORMAL LOW (ref 8.9–10.3)
Chloride: 113 mmol/L — ABNORMAL HIGH (ref 98–111)
Creatinine, Ser: 2.48 mg/dL — ABNORMAL HIGH (ref 0.44–1.00)
GFR, Estimated: 23 mL/min — ABNORMAL LOW (ref >60)
Glucose, Bld: 114 mg/dL — ABNORMAL HIGH (ref 70–99)
Potassium: 3.8 mmol/L (ref 3.5–5.1)
Sodium: 143 mmol/L (ref 135–145)
Total Bilirubin: 1.1 mg/dL (ref 0.3–1.2)
Total Protein: 5.2 g/dL — ABNORMAL LOW (ref 6.5–8.1)

## 2022-03-26 LAB — CBC
HCT: 29.7 % — ABNORMAL LOW (ref 36.0–46.0)
Hemoglobin: 9.2 g/dL — ABNORMAL LOW (ref 12.0–15.0)
MCH: 28.1 pg (ref 26.0–34.0)
MCHC: 31 g/dL (ref 30.0–36.0)
MCV: 90.8 fL (ref 80.0–100.0)
Platelets: 235 10*3/uL (ref 150–400)
RBC: 3.27 MIL/uL — ABNORMAL LOW (ref 3.87–5.11)
RDW: 14.7 % (ref 11.5–15.5)
WBC: 6.1 10*3/uL (ref 4.0–10.5)
nRBC: 0 % (ref 0.0–0.2)

## 2022-03-26 LAB — HIV ANTIBODY (ROUTINE TESTING W REFLEX): HIV Screen 4th Generation wRfx: NONREACTIVE

## 2022-03-26 LAB — ECHOCARDIOGRAM COMPLETE
Area-P 1/2: 3.46 cm2
Calc EF: 54.8 %
Height: 72 in
S' Lateral: 3.6 cm
Single Plane A2C EF: 46.5 %
Single Plane A4C EF: 63.7 %
Weight: 2720 oz

## 2022-03-26 LAB — GLUCOSE, CAPILLARY
Glucose-Capillary: 130 mg/dL — ABNORMAL HIGH (ref 70–99)
Glucose-Capillary: 110 mg/dL — ABNORMAL HIGH (ref 70–99)
Glucose-Capillary: 112 mg/dL — ABNORMAL HIGH (ref 70–99)
Glucose-Capillary: 104 mg/dL — ABNORMAL HIGH (ref 70–99)

## 2022-03-26 LAB — LIPASE, BLOOD: Lipase: 21 U/L (ref 11–51)

## 2022-03-26 NOTE — Progress Notes (Signed)
PROGRESS NOTE    [name]  XVY:[id number] DOB: 17-Dec-1970 DOA: 03/25/2022 PCP: Delma Officer, PA    Brief Narrative:  51 year old female, with chronic kidney disease, combined CHF, type 1 diabetes, diabetic gastroparesis, admitted with progressive/intractable nausea and vomiting.  She is also noted to have some evidence of volume overload.  Started on antiemetics and diet is being slowly advanced.  She did receive a dose of IV Lasix and echocardiogram is being updated.   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 intake is improved, anticipate transition back to insulin pump -A1c 6.3 -Continue on sliding scale insulin -Blood sugar stable overnight   Acute kidney injury on chronic kidney disease stage IIIb -Baseline creatinine appears to be around 1.7 -Creatinine on admission 2.1 -Since she had some evidence of volume overload, she received a dose of IV Lasix yesterday -Creatinine up to 2.4 today -Hold off on further diuretics, monitor urine output and renal function   Acute on chronic combined CHF -Last echo from 08/2020 shows EF of 40 to 45% -Repeat echo pending, if any decline in EF, would need cardiology input -Hold off on further diuretics since creatinine is bumping up -She is also on beta-blockers -Resume Jardiance on  discharge -Cannot use ARB due to renal dysfunction -Overall volume status does appear to be better today  DVT prophylaxis: heparin injection 5,000 Units Start: 03/25/22 2200  Code Status: Full code Family Communication: Discussed with patient Disposition Plan: Status is: Observation The patient will require care spanning > 2 midnights and should be moved to inpatient because: Continued cardiac work-up with echo, advancing diet as tolerated, monitoring renal function     Consultants:    Procedures:  Echo  Antimicrobials:      Subjective: Feels that nausea and vomiting is better today.  She is tolerating clear liquids.  She did require some nausea when she first arrived, but did not require any further meds overnight.  Reports good urine output after Lasix.  Feels that her shortness of breath is better today.  Objective: Vitals:   03/25/22 1737 03/25/22 2121 03/26/22 0243 03/26/22 0521  BP: (!) 142/85 137/79 (!) 148/86 (!) 146/85  Pulse: 77 82 77 80  Resp: 16 18 15 16   Temp: 97.9 F (36.6 C) 98.4 F (36.9 C) 98 F (36.7 C) 98.2 F (36.8 C)  TempSrc: Oral     SpO2: 100% 100% 100% 100%  Weight:      Height:        Intake/Output Summary (Last 24 hours) at 03/26/2022 0946 Last data filed at 03/25/2022 2200 Gross per 24 hour  Intake 646.95 ml  Output --  Net 646.95 ml   Filed Weights   03/25/22 0854  Weight: 77.1 kg    Examination:  General exam: Appears calm and comfortable  Respiratory system: Clear to auscultation. Respiratory effort normal. Cardiovascular system: S1 &  S2 heard, RRR. No JVD, murmurs, rubs, gallops or clicks. No pedal edema. Gastrointestinal system: Abdomen is nondistended, soft and nontender. No organomegaly or masses felt. Normal bowel sounds heard. Central nervous system: Alert and oriented. No focal neurological deficits. Extremities: Symmetric 5 x 5 power. Skin: No rashes, lesions or ulcers Psychiatry: Judgement and insight appear  normal. Mood & affect appropriate.     Data Reviewed: I have personally reviewed following labs and imaging studies  CBC: Recent Labs  Lab 03/25/22 0900 03/25/22 0950 03/26/22 0411  WBC 8.0  --  6.1  NEUTROABS 6.2  --   --   HGB 10.7* 10.5* 9.2*  HCT 32.7* 31.0* 29.7*  MCV 84.9  --  90.8  PLT 334  --  235   Basic Metabolic Panel: Recent Labs  Lab 03/25/22 0900 03/25/22 0950 03/26/22 0411  NA 142 141 143  K 4.1 4.7 3.8  CL 107  --  113*  CO2 27  --  24  GLUCOSE 295*  --  114*  BUN 21*  --  22*  CREATININE 2.13*  --  2.48*  CALCIUM 8.6*  --  8.2*   GFR: Estimated Creatinine Clearance: 31 mL/min (A) (by C-G formula based on SCr of 2.48 mg/dL (H)). Liver Function Tests: Recent Labs  Lab 03/25/22 0900 03/26/22 0411  AST 10* 10*  ALT 7 9  ALKPHOS 80 65  BILITOT 1.0 1.1  PROT 6.7 5.2*  ALBUMIN 3.4* 2.4*   Recent Labs  Lab 03/26/22 0411  LIPASE 21   No results for input(s): "AMMONIA" in the last 168 hours. Coagulation Profile: No results for input(s): "INR", "PROTIME" in the last 168 hours. Cardiac Enzymes: No results for input(s): "CKTOTAL", "CKMB", "CKMBINDEX", "TROPONINI" in the last 168 hours. BNP (last 3 results) No results for input(s): "PROBNP" in the last 8760 hours. HbA1C: Recent Labs    03/25/22 1952  HGBA1C 6.3*   CBG: Recent Labs  Lab 03/25/22 0858 03/25/22 1039 03/25/22 1738 03/25/22 2103 03/26/22 0754  GLUCAP 295* 249* 151* 118* 130*   Lipid Profile: No results for input(s): "CHOL", "HDL", "LDLCALC", "TRIG", "CHOLHDL", "LDLDIRECT" in the last 72 hours. Thyroid Function Tests: No results for input(s): "TSH", "T4TOTAL", "FREET4", "T3FREE", "THYROIDAB" in the last 72 hours. Anemia Panel: No results for input(s): "VITAMINB12", "FOLATE", "FERRITIN", "TIBC", "IRON", "RETICCTPCT" in the last 72 hours. Sepsis Labs: Recent Labs  Lab 03/25/22 0900 03/25/22 1114  LATICACIDVEN 1.1 0.9    No results found for this or any previous  visit (from the past 240 hour(s)).       Radiology Studies: DG Chest Port 1 View  Result Date: 03/25/2022 CLINICAL DATA:  Vomiting. EXAM: PORTABLE CHEST 1 VIEW COMPARISON:  08/25/2020 FINDINGS: The cardio pericardial silhouette is enlarged. There is pulmonary vascular congestion without overt pulmonary edema. Probable atelectasis left base with likely tiny bilateral pleural effusions. Telemetry leads overlie the chest. IMPRESSION: 1. Enlargement of the cardiopericardial silhouette with pulmonary vascular congestion. 2. Probable tiny bilateral pleural effusions. Electronically Signed   By: Kennith Center M.D.   On: 03/25/2022 10:18        Scheduled Meds:  carvedilol  12.5 mg Oral BID   heparin  5,000 Units Subcutaneous Q8H   insulin aspart  0-5 Units Subcutaneous QHS   insulin aspart  0-9 Units Subcutaneous TID WC   insulin glargine-yfgn  50 Units Subcutaneous Daily   pantoprazole (PROTONIX) IV  40 mg Intravenous Q24H   Continuous Infusions:   LOS: 0 days  Time spent: 79mins    Kathie Dike, MD [facility]   If 7PM-7AM, please contact night-coverage [email or web address]  03/26/2022, 9:46 AM

## 2022-03-26 NOTE — Progress Notes (Signed)
NUTRITION NOTE  Page received from Financial controller late afternoon sharing that patient reports getting headaches if she does not eat every 4 hours. RD was off campus following the page.  Attempted to call room phone x3 and it seems to not be working. Able to talk with RN via secure chat who shares that patient eats healthy meals and snacks, low carb overall. RN shares that she talked with [facility] and has received permission for patient to receive up to 3 meal trays per meal period.  RD will enter note in [facility] and in the diet order to align with this. Placed order 10 AM snack for tomorrow of cheddar cheese, fresh fruit cup, and a bottle of water.  RD will see patient in person for full assessment on 8/23.      Jarome Matin, MS, RD, LDN, [place] Registered Dietitian II Inpatient Clinical Nutrition RD pager # and on-call/weekend pager # available in [facility]

## 2022-03-26 NOTE — Progress Notes (Signed)
  Echocardiogram 2D Echocardiogram has been performed.  [name] M 03/26/2022, 2:04 PM

## 2022-03-26 NOTE — TOC Initial Note (Signed)
Transition of Care [facility]) - Initial/Assessment Note    Patient Details  Name: [name] MRN: [id number] Date of Birth: 21-Jun-1971  [facility] of [facility]) CM/SW Contact:    Lavenia Atlas, RN Phone Number: 03/26/2022, 2:37 PM  Clinical Narrative:   Patient is not appropriate for CHF protocol. [facility]acy: [facility] [address] [place], has transportation home at discharge.                TOC will continue to follow.  Expected Discharge Plan: Home/Self Care Barriers to Discharge: Continued Medical Work up   Patient Goals and CMS Choice        Expected Discharge Plan and Services Expected Discharge Plan: Home/Self Care   Discharge Planning Services: CM Consult   Living arrangements for the past 2 months: Single Family Home                                      Prior Living Arrangements/Services Living arrangements for the past 2 months: Single Family Home Lives with:: Self Patient language and need for interpreter reviewed:: Yes Do you feel safe going back to the place where you live?: Yes      Need for Family Participation in Patient Care: No (Comment) Care giver support system in place?: Yes (comment)   Criminal Activity/Legal Involvement Pertinent to Current Situation/Hospitalization: No - Comment as needed  Activities of Daily Living Home Assistive Devices/Equipment: CBG Meter, Eyeglasses, Insulin Pump (Freestyle [place]) ADL Screening (condition at time of admission) Patient's cognitive ability adequate to safely complete daily activities?: Yes Is the patient deaf or have difficulty hearing?: No Does the patient have difficulty seeing, even when wearing glasses/contacts?: No Does the patient have difficulty concentrating, remembering, or making decisions?: No Patient able to express need for assistance with ADLs?: Yes Does the patient have difficulty dressing or bathing?: No Independently performs ADLs?: Yes (appropriate for developmental  age) Does the patient have difficulty walking or climbing stairs?: No Weakness of Legs: None Weakness of Arms/Hands: None  Permission Sought/Granted Permission sought to share information with : Case Manager Permission granted to share information with : Yes, Verbal Permission Granted  Share Information with NAME: case manager           Emotional Assessment Appearance:: Appears stated age Attitude/Demeanor/Rapport: Gracious Affect (typically observed): Accepting Orientation: : Oriented to Self, Oriented to Place, Oriented to  Time, Oriented to Situation Alcohol / Substance Use: Not Applicable Psych Involvement: No (comment)  Admission diagnosis:  Gastroparesis [K31.84] AKI (acute kidney injury) (HCC) [N17.9] Intractable nausea and vomiting [R11.2] Nausea and vomiting [R11.2] Patient Active Problem List   Diagnosis Date Noted   Nausea and vomiting 03/26/2022   AKI (acute kidney injury) (HCC) 03/25/2022   Stage 3b chronic kidney disease (CKD) (HCC) 03/25/2022   Acute on chronic combined systolic and diastolic CHF (congestive heart failure) (HCC) 03/25/2022   Inguinal adenopathy 12/14/2021   Diabetic gastroparesis (HCC) 08/31/2020   Gastric polyps    Generalized abdominal pain    Intractable nausea and vomiting 08/08/2020   Bone pain 06/16/2020   Deficiency anemia 04/21/2020   Preventive measure 04/21/2020   Heart failure with normal ejection fraction (HCC) 12/14/2019   Abdominal pain, epigastric 12/02/2019   Constipation 12/02/2019   Loss of weight 12/02/2019   Leg edema 11/21/2019   Uncontrolled type 1 diabetes mellitus with hyperglycemia, with long-term current use of insulin (HCC) 10/11/2019  Dysfunctional uterine bleeding 10/11/2019   Mild renal insufficiency 10/11/2019   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, limited to breakdown of skin ([place]) 07/30/2016   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[place]) 07/30/2016   Multiple thyroid nodules 33/00/7622    Complicated grieving 63/33/5456   Ulcers of both lower legs ([place]) 02/20/2014   Health maintenance examination 09/20/2013   Diabetic ulcer of left foot ([place]) 05/12/2013   Diabetic foot ulcers ([place]) 05/01/2013   Skin rash 01/05/2013   Contraception management 12/31/2012   Mood disorder ([place]) 12/30/2012   Unspecified vitamin D deficiency 12/03/2012   H/O thyroid nodule 12/03/2012   Type 1 diabetes mellitus with diabetic neuropathy ([place]) 06/18/2007   PCP:  [name], PA Pharmacy:   [id number] [place] [place] - [address] [place] [address] Phone: [phone] Fax: [phone]  [facility] [address] [place] [address] Phone: [phone] Fax: [place], [place] - [place] [address] [place] [address] Phone: [phone] Fax: [place] [facility] [place] [address] Phone: [phone] Fax: [phone]     Social Determinants of Health (SDOH) Interventions    Readmission Risk Interventions     No data to display

## 2022-03-27 ENCOUNTER — Inpatient Hospital Stay (HOSPITAL_COMMUNITY): Payer: BC Managed Care – PPO

## 2022-03-27 DIAGNOSIS — I5042 Chronic combined systolic (congestive) and diastolic (congestive) heart failure: Secondary | ICD-10-CM

## 2022-03-27 DIAGNOSIS — E1143 Type 2 diabetes mellitus with diabetic autonomic (poly)neuropathy: Secondary | ICD-10-CM | POA: Diagnosis not present

## 2022-03-27 DIAGNOSIS — N1832 Chronic kidney disease, stage 3b: Secondary | ICD-10-CM

## 2022-03-27 DIAGNOSIS — N179 Acute kidney failure, unspecified: Secondary | ICD-10-CM | POA: Diagnosis not present

## 2022-03-27 DIAGNOSIS — E1040 Type 1 diabetes mellitus with diabetic neuropathy, unspecified: Secondary | ICD-10-CM

## 2022-03-27 LAB — URINALYSIS, ROUTINE W REFLEX MICROSCOPIC
Bilirubin Urine: NEGATIVE
Glucose, UA: NEGATIVE mg/dL
Hgb urine dipstick: NEGATIVE
Ketones, ur: NEGATIVE mg/dL
Nitrite: NEGATIVE
Protein, ur: >=300 mg/dL — AB
Specific Gravity, Urine: 1.009 (ref 1.005–1.030)
pH: 7 (ref 5.0–8.0)

## 2022-03-27 LAB — BASIC METABOLIC PANEL
Anion gap: 6 (ref 5–15)
BUN: 28 mg/dL — ABNORMAL HIGH (ref 6–20)
CO2: 25 mmol/L (ref 22–32)
Calcium: 8.1 mg/dL — ABNORMAL LOW (ref 8.9–10.3)
Chloride: 110 mmol/L (ref 98–111)
Creatinine, Ser: 2.45 mg/dL — ABNORMAL HIGH (ref 0.44–1.00)
GFR, Estimated: 23 mL/min — ABNORMAL LOW (ref >60)
Glucose, Bld: 79 mg/dL (ref 70–99)
Potassium: 3.7 mmol/L (ref 3.5–5.1)
Sodium: 141 mmol/L (ref 135–145)

## 2022-03-27 LAB — GLUCOSE, CAPILLARY
Glucose-Capillary: 106 mg/dL — ABNORMAL HIGH (ref 70–99)
Glucose-Capillary: 118 mg/dL — ABNORMAL HIGH (ref 70–99)
Glucose-Capillary: 104 mg/dL — ABNORMAL HIGH (ref 70–99)
Glucose-Capillary: 89 mg/dL (ref 70–99)
Glucose-Capillary: 127 mg/dL — ABNORMAL HIGH (ref 70–99)

## 2022-03-27 LAB — CBC
HCT: 27.9 % — ABNORMAL LOW (ref 36.0–46.0)
Hemoglobin: 9 g/dL — ABNORMAL LOW (ref 12.0–15.0)
MCH: 28.2 pg (ref 26.0–34.0)
MCHC: 32.3 g/dL (ref 30.0–36.0)
MCV: 87.5 fL (ref 80.0–100.0)
Platelets: 257 10*3/uL (ref 150–400)
RBC: 3.19 MIL/uL — ABNORMAL LOW (ref 3.87–5.11)
RDW: 14.3 % (ref 11.5–15.5)
WBC: 5.2 10*3/uL (ref 4.0–10.5)
nRBC: 0 % (ref 0.0–0.2)

## 2022-03-27 MED ORDER — ALUM & MAG HYDROXIDE-SIMETH 200-200-20 MG/5ML PO SUSP
30.0000 mL | ORAL | Status: DC | PRN
  Administered 2022-03-27: 30 mL via ORAL
  Filled 2022-03-27: qty 30

## 2022-03-27 MED ORDER — GLUCERNA SHAKE PO LIQD
237.0000 mL | ORAL | Status: DC
  Administered 2022-03-27: 237 mL via ORAL
  Filled 2022-03-27 (×4): qty 237

## 2022-03-27 MED ORDER — PANTOPRAZOLE SODIUM 40 MG PO TBEC
40.0000 mg | DELAYED_RELEASE_TABLET | Freq: Every day | ORAL | Status: DC
  Administered 2022-03-27 – 2022-03-28 (×2): 40 mg via ORAL
  Filled 2022-03-27 (×2): qty 1

## 2022-03-27 MED ORDER — LACTATED RINGERS IV SOLN
INTRAVENOUS | Status: DC
Start: 2022-03-27 — End: 2022-03-28

## 2022-03-27 NOTE — Discharge Instructions (Signed)
Gastroparesis Nutrition Therapy  Gastroparesis means that your stomach empties very slowly. This happens when the nerves to your stomach are damaged or do not work properly. This can cause bloating, stomach discomfort or pain, feeling full after eating only a small amount of food, nausea, or vomiting. If you have diabetes in addition to gastroparesis, it is important to control your blood glucose. This will help the stomach empty.  Tips Following these tips may help your stomach empty faster:  Eat small, frequent meals (4 to 6 times per day).  Do not eat solid foods that are high in fat and do not add too much fat to foods. See the Foods Not Recommended table for foods that are high fat.  High-fat solid foods may delay the emptying of your stomach.  Liquids that contain fat, such as milkshakes, may be tolerated and can provide needed calories. Do not eat foods high in fiber. Do not take fiber supplements or fiber bulking agents for constipation.  Do not eat foods that increase acid reflux:  Acidic, spicy, fried and greasy foods  Caffeine  Mint Do not drink alcohol or smoke  Do not drink carbonated beverages, as they increase bloating.  Chew foods well before swallowing. Solid foods in the stomach do not empty well. If you have difficulty tolerating solid foods, ground foods may be better.  If symptoms are severe, semi-solid foods or liquids may need to be your main food sources. Choose liquid nutritional supplements that have less than or equal to 2 grams fiber per serving.  Sit upright while eating and sit upright or walk after meals. Do not lie down for 3 to 4 hours after eating to avoid reflux or regurgitation.  If you wish to nap during the day, nap first and then eat.  Drinking fluids at meals can take up room in your stomach, and you might not get enough calories. At every meal, first eat a grain food and a protein food or dairy product if your body can tolerate it. Drink fluids with  calories. It may be better to delay fluids until after the meal and drink more between meals.   Foods Recommended Food Group Foods Recommended   Grains Choose grain foods with less than 2 grams of fiber per serving; these will be made with white flour Crackers: saltines or graham crackers Cold cereal: puffed rice Cream of rice or wheat Grits (fine ground) Gluten free low fiber foods Pretzels White bread, toasted White rice, cook until very soft  Protein Foods Lean meat and poultry: well-cooked, very tender, moist, and chopped fine Fish: tuna, salmon, or white fish Egg whites, scrambled Peanut butter (limit to 1 tablespoon at a time)  Dairy Milk*, drink 2% if tolerated to get more nutrients or lactose-free 2% milk Fortified non-dairy milks: almond, cashew, coconut, or rice (be aware that these options are not good sources of protein so you will need to eat an additional protein food) Fortified pea milk or soymilk (may cause gas and bloating for some) Instant breakfast* (pre-made lactose-free is sold in bottles) Milkshakes* (try blending in  to  cup canned fruit) Ice cream* (low-fat may be tolerated better; use in milkshakes to increase calories) Frozen yogurt Yogurt* Puddings and custard* Sherbet Liquid nutritional supplements with less than or equal to 2 grams fiber per 1 cup serving *Use lactose-free varieties to reduce gas and bloating  Vegetables Canned and well-cooked vegetables without seeds, skins or hulls Carrots, cooked Mashed potatoes (white, red or yellow) Sweet   potato  Fruit Canned, soft and well-cooked fruits without seeds, skins or membranes Applesauce Banana, mashed may be tolerated better Diced peaches/pears fruit cups in juice Melon, very soft, cut into small pieces Fruit nectar juices  Oils When possible choose oils rather than solid fats Canola or olive oil Margarine  Other Clear soup Gelatin Popsicles   Foods Not Recommended Food Group Foods Not  Recommended   Grains Bran Grains foods with 2 or more grams of fiber per serving: barley, brown rice, kasha, quinoa Popcorn Whole grain and high-fiber cereals, including oats or granola Whole grain bread or pasta  Protein Foods Fried meats, poultry or fish Sausage, bacon or hot dogs Seafood Tough meat, meat with gristle: steak, roast beef or pork chops Beans, peas or lentils Nuts  Dairy Cheese slices Liquid nutritional supplements that have more than 2 grams fiber per serving Pea milk, soymilk (may increase gas and bloating)  Vegetables Raw or undercooked vegetables Alfalfa, asparagus, bean sprouts, broccoli, brussels sprouts, cabbage, cauliflower, corn, green peas or any other kind of peas, lima beans, mushrooms, okra, onions, parsnips, peppers, pickles, potato skins, or spinach  Fruit Fresh fruit except for the ones in the foods recommended table Acidic fruit and juices: oranges/orange juice, grapefruit/grapefruit juice, tomatoes/tomato juice Avocado Berries Coconut Dried fruit Fruit skin Mandarin oranges Pineapple  Oils Fried foods of any type  Other Coffee Olives or pickles Pizza Salsa Sushi   Gastroparesis Sample 1-Day Menu  Breakfast 1 slice white toast (1 carbohydrate serving)  1 teaspoon margarine, soft, tub   cup egg substitute  1 cup peach nectar (2 carbohydrate servings)  Morning Snack Smoothie made with:  small banana (1 carbohydrate serving)  1/3 cup Greek strawberry yogurt ( carbohydrate serving)  1 cup 2% milk (1 carbohydrate serving)  Lunch 2 ounces canned chicken  1 teaspoon mayonnaise  9 saltine crackers (1 carbohydrate servings)   cup applesauce (1 carbohydrate serving)  Afternoon Snack 1 slice white toast (1 carbohydrate serving)  1 tablespoon smooth peanut butter  Evening Meal 2 ounces baked fish   cup mashed potatoes (1 carbohydrate serving)  1 teaspoon olive oil  1 cup 2% milk (1 carbohydrate serving)  Evening Snack 1 packet instant  breakfast (1 carbohydrate servings)  1 cup 2% milk (1 carbohydrate serving)   Gastroparesis Vegetarian (Lacto-Ovo) Sample 1-Day Menu  Breakfast  cup cooked farina (1 carbohydrate serving)   cup egg substitute  2 teaspoons olive oil   cup peach nectar (2 carbohydrate servings)   cup 2% milk ( carbohydrate serving)  Morning Snack 1 slice white toast (1 carbohydrate serving)  1 tablespoon smooth peanut butter  Lunch  cup vegetable soup (1 carbohydrate serving)  9 saltine crackers (1 carbohydrate serving)   cup applesauce (1 carbohydrate serving)   cup 2% milk ( carbohydrate serving)  Afternoon Snack 6 ounces plain yogurt (1 carbohydrate serving)   small banana (1 carbohydrate servings)  Evening Meal  cup baked tofu  2/3 cup white rice (2 carbohydrate servings)  2 teaspoons olive oil   cup 2% milk ( carbohydrate serving)  Evening Snack 1 packet instant breakfast (1 carbohydrate servings)  1 cup 2% milk (1 carbohydrate serving)   Gastroparesis Vegan Sample 1-Day Menu  Breakfast  cup cooked farina (1 carbohydrate serving)  1/3 cup tofu scramble  2 teaspoons olive oil   cup peach nectar (2 carbohydrate servings)   cup almond milk fortified with calcium, vitamin B12, and vitamin D  Morning Snack 1 slice white toast (1   carbohydrate serving)  1 tablespoon smooth peanut butter  Lunch  cup vegetable soup (1 carbohydrate serving)  9 saltine crackers (1 carbohydrate serving)   cup applesauce (1 carbohydrate serving)  Afternoon Snack 6 ounces plain soy yogurt (1 carbohydrate servings)   small banana (1 carbohydrate serving)  Evening Meal  cup baked tofu  2/3 cup white rice (2 carbohydrate servings)  2 teaspoons olive oil   cup almond milk fortified with calcium, vitamin B12, and vitamin D  Evening Snack  scoop soy protein powder ( carbohydrate serving)   cup almond milk fortified with calcium, vitamin B12, and vitamin D   Copyright 2020  [employer]  [employer]. All rights reserved.  

## 2022-03-27 NOTE — Assessment & Plan Note (Addendum)
Cr worsening over the last year.  Noticeably up since Apr when she started [place].  Here on admission, her Cr was >2 on admission, worsened with Lasix. Presumably this is hypovolemic in the setting of vomiting.    Today, stable from yesterday with holding fluids and diuretics. - Give gentle fluids - Trend Cr

## 2022-03-27 NOTE — Assessment & Plan Note (Addendum)
Acute CHF ruled out.  No edema on exam.  CXR with chronic change, no frank edema.    Echo this amdission shows EF improved to 50-55%, reduced PASP. - Hold diuretics, lisinopril, Jardiance - Continue BB

## 2022-03-27 NOTE — Hospital Course (Signed)
[name] is a 51 y.o. F with CKD, sCHF, and T1DM c/b gastroparesis who presented with nausea and vomiting and abdominal pain.    Given Lasix and admitted.

## 2022-03-27 NOTE — Assessment & Plan Note (Signed)
Follows with [facility], Dr. Candiss Norse.

## 2022-03-27 NOTE — Assessment & Plan Note (Signed)
On insulin pump at home. Glucose controlled here - Continue SS corrections - Continue semglee

## 2022-03-27 NOTE — Progress Notes (Signed)
Initial Nutrition Assessment  DOCUMENTATION CODES:   Not applicable  INTERVENTION:  - will order Glucerna Shake once/day, each supplement provides 220 kcal and 10 grams of protein.  - continue 10 AM snack and will add 2 PM and 8 PM snacks.  - will add Gastroparesis Nutrition Therapy handout to AVS.  - complete NFPE when feasible.    NUTRITION DIAGNOSIS:   Increased nutrient needs related to acute illness as evidenced by estimated needs.  GOAL:   Patient will meet greater than or equal to 90% of their needs  MONITOR:   PO intake, Supplement acceptance, Labs, Weight trends  REASON FOR ASSESSMENT:   Malnutrition Screening Tool  ASSESSMENT:   51 year old female with medical history of CKD, combined CHF, type 1 DM, diabetic gastroparesis, arthritis, anxiety, depression, and anemia. She was admitted due to progressive, intractable N/V. in the ED she was noted to be volume overloaded. She received IV Lasix this admission and was admitted for AKI.  Patient sitting on the edge of bed with no visitors present at the time of RD visit. Flow sheet documentation indicates she ate 100% of lunch yesterday (746 kcal and 37 grams protein) and 100% of breakfast today (411 kcal and 11 grams protein).  Entered 10 AM snack: cheddar cheese, fresh fruit cup, and a bottle of water. This had arrived prior to RD visit and patient is very appreciative. She is interested in receiving 2 PM and 8 PM snacks daily though she is hopeful to d/c home today if possible.  She shares that she typically eats something small about every 2 hours. She is unable to eat large amounts at a time so will often aim for 6 smaller portions of food/day. If she eats too much or too quickly she experiences feelings of indigestion.  She works at one of the [employer] in [place] and there is not much around it so she packs her lunch and a few snacks for during her work days.   She tolerate cold items (such as cucumbers, tuna  salad, chicken salad, crackers) better than warm foods.   Weight on 8/21 was documented as 170 lb and appears to be a stated weight. PTA the most recently documented weight was 191 lb on 12/14/21. This would indicate 11% body weight) in the past 3 months.    Labs reviewed; HgbA1c: 6.3%, CBGs: 106, 118, 104 mg/dl, BUN: 28 mg/dl, creatinine: 2.45 mg/dl, Ca: 8.1 mg/dl, GFR: 23 ml/min.  Medications reviewed; sliding scale novolog, 50 units semglee/day, 40 mg oral protonix/day.  IVF; LR @ 125 ml/hr    NUTRITION - FOCUSED PHYSICAL EXAM:  Deferred at this time.  Diet Order:   Diet Order             Diet heart healthy/carb modified Room service appropriate? Yes; Fluid consistency: Thin  Diet effective now                   EDUCATION NEEDS:   Education needs have been addressed  Skin:  Skin Assessment: Reviewed RN Assessment  Last BM:  PTA/unknown  Height:   Ht Readings from Last 1 Encounters:  03/25/22 6' (1.829 m)    Weight:   Wt Readings from Last 1 Encounters:  03/25/22 77.1 kg     BMI:  Body mass index is 23.06 kg/m.  Estimated Nutritional Needs:  Kcal:  1950-2200 kcal Protein:  95-105 grams Fluid:  >/= 2 L/day      Jarome Matin, MS, RD, LDN, CNSC Registered  Dietitian II Inpatient Clinical Nutrition RD pager # and on-call/weekend pager # available in [place]

## 2022-03-27 NOTE — Assessment & Plan Note (Signed)
Improving - Continue antiemetics

## 2022-03-27 NOTE — Progress Notes (Signed)
  Progress Note   Patient: [name] FBP:[id number] DOB: 12-25-1970 DOA: 03/25/2022     1 DOS: the patient was seen and examined on 03/27/2022 at 16:00      Brief hospital course: Mrs. Grandville Silos is a 51 y.o. F with CKD, sCHF, and T1DM c/b gastroparesis who presented with nausea and vomiting and abdominal pain.    Given Lasix and admitted.        Assessment and Plan: * Diabetic gastroparesis ([place]) Improving - Continue antiemetics  AKI (acute kidney injury) ([place]) Cr worsening over the last year.  Noticeably up since Apr when she started [place].  Here on admission, her Cr was >2 on admission, worsened with Lasix. Presumably this is hypovolemic in the setting of vomiting.    Today, stable from yesterday with holding fluids and diuretics. - Give gentle fluids - Trend Cr  Chronic combined systolic (congestive) and diastolic (congestive) heart failure (HCC) Acute CHF ruled out.  No edema on exam.  CXR with chronic change, no frank edema.    Echo this amdission shows EF improved to 50-55%, reduced PASP. - Hold diuretics, lisinopril, Jardiance - Continue BB   Stage 3b chronic kidney disease (CKD) (HCC) Follows with [facility], Dr. Candiss Norse.  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) On insulin pump at home. Glucose controlled here - Continue SS corrections - Continue semglee           Subjective: She is improving, she has no swelling, dyspnea, orthopnea, dyspnea on exertion, confusion, cough.     Physical Exam: Vitals:   03/26/22 1302 03/26/22 2111 03/27/22 0450 03/27/22 1332  BP: 109/68 132/83 (!) 152/76 (!) 165/94  Pulse: 79 80 77 78  Resp: $Remo'18  17 18  'GqQNq$ Temp: 97.8 F (36.6 C) 98 F (36.7 C) 98.2 F (36.8 C) 97.7 F (36.5 C)  TempSrc: Oral   Oral  SpO2: 99% 95% 98% 100%  Weight:      Height:       Thin adult female, sitting up in bed, interactive RRR, no murmurs, no peripheral edema Respiratory rate normal, lungs clear without  rales or wheezes Attention normal, affect normal, judgment insight appear normal, face symmetric, speech  Data Reviewed: BC metabolic panel shows creatinine 2.45 Hemogram shows hemoglobin 9 hemoglobin A1c 6.3% Renal ultrasound normal Echocardiogram shows grade 2 diastolic dysfunction, EF 50 to 55%       Disposition: Status is: Inpatient The patient was admitted with acute kidney injury in the setting of gastroparesis  Her kidney function has not improved, we will give some IV fluids, monitor overnight, if improving with fluids, and she is able to tolerate p.o., likely home tomorrow, otherwise we will consult nephrology        Author: Edwin Dada, MD 03/27/2022 9:23 PM  For on call review [email or web address].

## 2022-03-27 NOTE — Progress Notes (Signed)
Pt complained of heart burn/ indigestion. RN ordered General admission PRN orders for Maalox and administered to pt. Closely monitoring pt.

## 2022-03-28 DIAGNOSIS — E1143 Type 2 diabetes mellitus with diabetic autonomic (poly)neuropathy: Secondary | ICD-10-CM | POA: Diagnosis not present

## 2022-03-28 DIAGNOSIS — K3184 Gastroparesis: Secondary | ICD-10-CM | POA: Diagnosis not present

## 2022-03-28 LAB — GLUCOSE, CAPILLARY
Glucose-Capillary: 60 mg/dL — ABNORMAL LOW (ref 70–99)
Glucose-Capillary: 130 mg/dL — ABNORMAL HIGH (ref 70–99)
Glucose-Capillary: 61 mg/dL — ABNORMAL LOW (ref 70–99)
Glucose-Capillary: 102 mg/dL — ABNORMAL HIGH (ref 70–99)
Glucose-Capillary: 59 mg/dL — ABNORMAL LOW (ref 70–99)
Glucose-Capillary: 80 mg/dL (ref 70–99)
Glucose-Capillary: 98 mg/dL (ref 70–99)

## 2022-03-28 LAB — BASIC METABOLIC PANEL
Anion gap: 3 — ABNORMAL LOW (ref 5–15)
BUN: 32 mg/dL — ABNORMAL HIGH (ref 6–20)
CO2: 25 mmol/L (ref 22–32)
Calcium: 8.1 mg/dL — ABNORMAL LOW (ref 8.9–10.3)
Chloride: 111 mmol/L (ref 98–111)
Creatinine, Ser: 2.13 mg/dL — ABNORMAL HIGH (ref 0.44–1.00)
GFR, Estimated: 28 mL/min — ABNORMAL LOW (ref >60)
Glucose, Bld: 107 mg/dL — ABNORMAL HIGH (ref 70–99)
Potassium: 4.1 mmol/L (ref 3.5–5.1)
Sodium: 139 mmol/L (ref 135–145)

## 2022-03-28 MED ORDER — DEXTROSE 50 % IV SOLN
INTRAVENOUS | Status: AC
  Filled 2022-03-28: qty 50

## 2022-03-28 MED ORDER — DEXTROSE 50 % IV SOLN
12.5000 g | INTRAVENOUS | Status: AC
  Administered 2022-03-28: 12.5 g via INTRAVENOUS

## 2022-03-28 MED ORDER — INSULIN GLARGINE-YFGN 100 UNIT/ML ~~LOC~~ SOLN
20.0000 [IU] | Freq: Every day | SUBCUTANEOUS | Status: DC
Start: 2022-03-28 — End: 2022-03-28
  Filled 2022-03-28: qty 0.2

## 2022-03-28 MED ORDER — DEXTROSE 50 % IV SOLN
12.5000 g | INTRAVENOUS | Status: AC
  Administered 2022-03-28: 12.5 g via INTRAVENOUS
  Filled 2022-03-28: qty 50

## 2022-03-28 MED ORDER — INSULIN GLARGINE-YFGN 100 UNIT/ML ~~LOC~~ SOLN
40.0000 [IU] | Freq: Every day | SUBCUTANEOUS | Status: DC
  Filled 2022-03-28: qty 0.4

## 2022-03-28 NOTE — Progress Notes (Signed)
NUTRITION NOTE  Secure chat message received last night from RN sharing that patient did not receive snacks at 2 PM and 8 PM as ordered by this RD.   Current diet order is Heart Healthy/Carb Modified. Reviewed CBGs from the past 24 hours and noted several instances of hypoglycemic episodes. Will change diet to Heart Healthy and will contact the kitchen to ensure snacks are being sent as ordered.   Able to share this information with MD and RN via secure chat at this time.      Megan Matin, MS, RD, LDN, [place] Registered Dietitian II Inpatient Clinical Nutrition RD pager # and on-call/weekend pager # available in [facility]

## 2022-03-28 NOTE — Discharge Summary (Signed)
Physician Discharge Summary   Patient: [name] MRN: [id number] DOB: 10/03/1970  Admit date:     03/25/2022  Discharge date: 03/28/22  Discharge Physician: Edwin Dada   PCP: [name], PA     Recommendations at discharge:  Follow up with Nephrology, Dr. Candiss Norse in 1 week Please obtain BMP in 1 week off new Jardiance     Discharge Diagnoses: Principal Problem:   Acute kidney injury Active Problems:   Diabetic gastroparesis flare   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)   Stage 3b chronic kidney disease (CKD) (HCC)   Chronic combined systolic (congestive) and diastolic (congestive) heart failure [facility])      Hospital Course: [name] is a 51 y.o. F with CKD, sCHF, and T1DM c/b gastroparesis who presented with nausea and vomiting and abdominal pain.    Found to have elevated creatinine. Admitted on antiemetics.      * AKI (acute kidney injury) ([place]) Cr worsening over the last year.  Noticeably up since Apr when she started [place].    Here on admission, her Cr was >2 on admission. She initially was thought to be volume overloaded and so was given Lasix and creatinine worsened.  Subsequently this was stopped, she was given fluids and Cr improved.  Discharged off [place].  Will have BMP in 1 week and will call primary Nephrologist, Dr. Candiss Norse for follow up    Diabetic gastroparesis [facility]) Likely this led to hypovolemia and AKI.  She was treated with fluids, antiemetics and her Cr improved and her appetite improved and she was able to take PO well on day of discharge.    Chronic combined systolic (congestive) and diastolic (congestive) heart failure (HCC) Acute CHF ruled out.  No edema on exam.  CXR with chronic change, no frank edema.    Echo this amdission shows EF improved to 50-55%, reduced PASP.  Jardiance stopped at discharge.      Stage 3b chronic kidney disease (CKD) ([place]) Follows with [facility], Dr.  Candiss Norse.  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) On insulin pump at home.                Diet recommendation:  Discharge Diet Orders (From admission, onward)     Start     Ordered   03/28/22 0000  Diet - low sodium heart healthy        03/28/22 1022             DISCHARGE MEDICATION: Allergies as of 03/28/2022       Reactions   Amoxicillin Itching   Atorvastatin    Dizziness    Dulaglutide Nausea And Vomiting   Lantus [insulin Glargine] Itching, Rash   Miconazole Nitrate Rash        Medication List     STOP taking these medications    aspirin-sod bicarb-citric acid 325 MG Tbef tablet Commonly known as: ALKA-SELTZER   Jardiance 10 MG Tabs tablet Generic drug: empagliflozin   lisinopril 5 MG tablet Commonly known as: ZESTRIL       TAKE these medications    acetaminophen 500 MG tablet Commonly known as: TYLENOL Take 500 mg by mouth every 6 (six) hours as needed for moderate pain.   albuterol 108 (90 Base) MCG/ACT inhaler Commonly known as: ProAir HFA Inhale 2 puffs into the lungs every 6 (six) hours as needed for wheezing or shortness of breath.   carvedilol 12.5 MG tablet Commonly known as: COREG Take 1  tablet (12.5 mg total) by mouth 2 (two) times daily.   feeding supplement (GLUCERNA SHAKE) Liqd Take 237 mLs by mouth 2 (two) times daily between meals. What changed:  when to take this reasons to take this   FreeStyle Libre 2 Sensor Misc CHANGE EVERY 14 DAYS   hydrOXYzine 50 MG tablet Commonly known as: ATARAX Take 50 mg by mouth in the morning, at noon, and at bedtime.   LUBRICATING EYE DROPS OP Place 1 drop into both eyes daily as needed (dry eyes).   NovoLOG 100 UNIT/ML injection Generic drug: insulin aspart Inject 0-120 Units into the skin daily.   OmniPod 10 Pack Misc by Does not apply route.   polyethylene glycol 17 g packet Commonly known as: MIRALAX / GLYCOLAX Take 17 g by mouth daily as needed for  moderate constipation.   pregabalin 50 MG capsule Commonly known as: LYRICA Take 50 mg by mouth 2 (two) times daily as needed (neuropathy).   tobramycin 0.3 % ophthalmic solution Commonly known as: TOBREX Place 1 drop into both eyes See admin instructions. Instill 1-2 drop into both eyes 3 times daily the day before, of, and the day after monthly eye injections   torsemide 20 MG tablet Commonly known as: DEMADEX Take 20 mg by mouth daily as needed (swelling).   [name] 200 UNIT/ML FlexTouch Pen Generic drug: insulin degludec Inject 50 Units into the skin daily as needed (when insulin pump stops working).        Follow-up Information     Gean Quint, MD Follow up.   Specialty: Nephrology Contact information: [address] [place] [address] [phone]                 Discharge Instructions     Diet - low sodium heart healthy   Complete by: As directed    Discharge instructions   Complete by: As directed    From Dr. Loleta Books: You were admitted for a gastroparesis flare. Here, we also noticed that your kidney function was worse  For now, STOP Jardiance and lisinopril  Get your labs checked in 1 week and ask your doctor (either Dr. Candiss Norse if he checks your kidney function, or your primary care doctor if they do it) if you can restart your lisinopril  Make sure you have an appointment with Dr. Candiss Norse within the next month  Do NOT take any medicines with NSAIDs in it This includes some over the counter cold medicines and other medicines (like some alkaseltzers, so be vigilant)  Resume your insulins as you see fit Keep in mind that insulin lingers in the body longer when kidney funciton is worse, so people with worsening kidney function often need less insulin, maybe let your sugars ride on the high side for a few days  Push fluids  Typically Coreg is taken twice daily, so you may take a dose tonight If you really only take it once daily, confirm this  with your heart doctor, but in that case, don't take it until tomorrow  You shouldn't take any torsemide until you have your kidney function checked again   Increase activity slowly   Complete by: As directed        Discharge Exam: [place] Weights   03/25/22 0854 03/28/22 0500  Weight: 77.1 kg 83.7 kg    General: Pt is alert, awake, not in acute distress Cardiovascular: RRR, nl S1-S2, no murmurs appreciated.   No LE edema.   Respiratory: Normal respiratory rate and rhythm.  CTAB without rales or wheezes. Abdominal: Abdomen soft and non-tender.  No distension or HSM.   Neuro/Psych: Strength symmetric in upper and lower extremities.  Judgment and insight appear normal.   Condition at discharge: fair  The results of significant diagnostics from this hospitalization (including imaging, microbiology, ancillary and laboratory) are listed below for reference.   Imaging Studies: US RENAL  Result Date: 03/27/2022 CLINICAL DATA:  AKI EXAM: RENAL / URINARY TRACT ULTRASOUND COMPLETE COMPARISON:  Abdominal ultrasound 05/01/2021, CT abdomen/pelvis 08/24/2020 and 11/21/2018 FINDINGS: Right Kidney: Renal measurements: 12.4 cm x 4.6 cm x 5.5 cm = volume: 165 mL. Echogenicity within normal limits. No mass or hydronephrosis visualized. Left Kidney: Renal measurements: 10.2 cm x 5.7 cm x 5.1 cm = volume: 160 mL. Echogenicity within normal limits. No mass or hydronephrosis visualized. Bladder: Appears normal for degree of bladder distention. Both ureteral jets were identified. Other: None. IMPRESSION: Normal renal ultrasound. Electronically Signed   By: Valetta Mole M.D.   On: 03/27/2022 10:32   ECHOCARDIOGRAM COMPLETE  Result Date: 03/26/2022    ECHOCARDIOGRAM REPORT   Patient Name:   [name] Date of Exam: 03/26/2022 Medical Rec #:  [id number]      Height:       72.0 in Accession #:    [id number]     Weight:       170.0 lb Date of Birth:  08-Nov-1970     BSA:          1.988 m Patient Age:    16 years        BP:           109/68 mmHg Patient Gender: F              HR:           78 bpm. Exam Location:  Inpatient Procedure: 2D Echo, Cardiac Doppler, Color Doppler and Strain Analysis Indications:    CHF-Acute Systolic O46.95  History:        Patient has prior history of Echocardiogram examinations, most                 recent 08/25/2020. CHF; Risk Factors:Diabetes. Chronic kidney                 disease. Anemia.  Sonographer:    Darlina Sicilian RDCS Referring Phys: [place]  1. Left ventricular ejection fraction, by estimation, is 50 to 55%. The left ventricle has low normal function. The left ventricle has no regional wall motion abnormalities. The left ventricular internal cavity size was mildly dilated. There is mild left ventricular hypertrophy. Left ventricular diastolic parameters are consistent with Grade II diastolic dysfunction (pseudonormalization). The average left ventricular global longitudinal strain is -12.0 %. The global longitudinal strain is abnormal.  2. Right ventricular systolic function is normal. The right ventricular size is normal. There is normal pulmonary artery systolic pressure.  3. The mitral valve is normal in structure. No evidence of mitral valve regurgitation.  4. The aortic valve is tricuspid. Aortic valve regurgitation is not visualized.  5. The inferior vena cava is normal in size with greater than 50% respiratory variability, suggesting right atrial pressure of 3 mmHg. Conclusion(s)/Recommendation(s): Compared to prior study, PASP has improved. Otherwise no significant changes. FINDINGS  Left Ventricle: Left ventricular ejection fraction, by estimation, is 50 to 55%. The left ventricle has low normal function. The left ventricle has no regional wall motion abnormalities. The average left ventricular global longitudinal strain is -12.0 %.  The global longitudinal strain is abnormal. The left ventricular internal cavity size was mildly dilated. There is mild  left ventricular hypertrophy. Left ventricular diastolic parameters are consistent with Grade II diastolic dysfunction (pseudonormalization). Right Ventricle: The right ventricular size is normal. Right ventricular systolic function is normal. There is normal pulmonary artery systolic pressure. The tricuspid regurgitant velocity is 2.00 m/s, and with an assumed right atrial pressure of 3 mmHg,  the estimated right ventricular systolic pressure is 92.1 mmHg. Left Atrium: Left atrial size was normal in size. Right Atrium: Right atrial size was normal in size. Pericardium: There is no evidence of pericardial effusion. Mitral Valve: The mitral valve is normal in structure. There is mild thickening of the mitral valve leaflet(s). There is mild calcification of the mitral valve leaflet(s). No evidence of mitral valve regurgitation. Tricuspid Valve: The tricuspid valve is normal in structure. Tricuspid valve regurgitation is trivial. Aortic Valve: The aortic valve is tricuspid. Aortic valve regurgitation is not visualized. Pulmonic Valve: Pulmonic valve regurgitation is not visualized. Aorta: The aortic root and ascending aorta are structurally normal, with no evidence of dilitation. Venous: The inferior vena cava is normal in size with greater than 50% respiratory variability, suggesting right atrial pressure of 3 mmHg.  LEFT VENTRICLE PLAX 2D LVIDd:         5.00 cm      Diastology LVIDs:         3.60 cm      LV e' medial:    6.13 cm/s LV PW:         1.10 cm      LV E/e' medial:  13.0 LV IVS:        0.90 cm      LV e' lateral:   8.41 cm/s LVOT diam:     2.00 cm      LV E/e' lateral: 9.5 LV SV:         48 LV SV Index:   24           2D Longitudinal Strain LVOT Area:     3.14 cm     2D Strain GLS Avg:     -12.0 %  LV Volumes (MOD) LV vol d, MOD A2C: 133.0 ml LV vol d, MOD A4C: 145.0 ml LV vol s, MOD A2C: 71.1 ml LV vol s, MOD A4C: 52.7 ml LV SV MOD A2C:     61.9 ml LV SV MOD A4C:     145.0 ml LV SV MOD BP:      76.0 ml  RIGHT VENTRICLE RV S prime:     12.00 cm/s TAPSE (M-mode): 1.8 cm LEFT ATRIUM             Index        RIGHT ATRIUM           Index LA diam:        4.30 cm 2.16 cm/m   RA Area:     13.70 cm LA Vol (A2C):   65.8 ml 33.09 ml/m  RA Volume:   31.90 ml  16.04 ml/m LA Vol (A4C):   69.4 ml 34.90 ml/m LA Biplane Vol: 68.1 ml 34.25 ml/m  AORTIC VALVE LVOT Vmax:   72.80 cm/s LVOT Vmean:  55.250 cm/s LVOT VTI:    0.152 m  AORTA Ao Root diam: 3.00 cm Ao Asc diam:  3.10 cm MITRAL VALVE               TRICUSPID VALVE MV Area (PHT):  3.46 cm    TR Peak grad:   16.0 mmHg MV Decel Time: 219 msec    TR Vmax:        200.00 cm/s MV E velocity: 79.70 cm/s MV A velocity: 43.30 cm/s  SHUNTS MV E/A ratio:  1.84        Systemic VTI:  0.15 m                            Systemic Diam: 2.00 cm Phineas Inches Electronically signed by Phineas Inches Signature Date/Time: 03/26/2022/3:17:41 PM    Final    DG Chest Port 1 View  Result Date: 03/25/2022 CLINICAL DATA:  Vomiting. EXAM: PORTABLE CHEST 1 VIEW COMPARISON:  08/25/2020 FINDINGS: The cardio pericardial silhouette is enlarged. There is pulmonary vascular congestion without overt pulmonary edema. Probable atelectasis left base with likely tiny bilateral pleural effusions. Telemetry leads overlie the chest. IMPRESSION: 1. Enlargement of the cardiopericardial silhouette with pulmonary vascular congestion. 2. Probable tiny bilateral pleural effusions. Electronically Signed   By: Misty Stanley M.D.   On: 03/25/2022 10:18    Microbiology: Results for orders placed or performed during the hospital encounter of 03/25/22  Urine Culture     Status: Abnormal (Preliminary result)   Collection Time: 03/27/22 10:55 AM   Specimen: Urine, Clean Catch  Result Value Ref Range Status   Specimen Description   Final    URINE, CLEAN CATCH Performed at [facility], [place] [address] [place], [place] [address]    Special Requests   Final    NONE Performed at [facility], [place] [address], [place], [place] [address]    Culture >=100,000 COLONIES/mL ESCHERICHIA COLI (A)  Final   Report Status PENDING  Incomplete    Labs: CBC: Recent Labs  Lab 03/25/22 0900 03/25/22 0950 03/26/22 0411 03/27/22 0429  WBC 8.0  --  6.1 5.2  NEUTROABS 6.2  --   --   --   HGB 10.7* 10.5* 9.2* 9.0*  HCT 32.7* 31.0* 29.7* 27.9*  MCV 84.9  --  90.8 87.5  PLT 334  --  235 185   Basic Metabolic Panel: Recent Labs  Lab 03/25/22 0900 03/25/22 0950 03/26/22 0411 03/27/22 0429 03/28/22 0426  NA 142 141 143 141 139  K 4.1 4.7 3.8 3.7 4.1  CL 107  --  113* 110 111  CO2 27  --  $R'24 25 25  'nW$ GLUCOSE 295*  --  114* 79 107*  BUN 21*  --  22* 28* 32*  CREATININE 2.13*  --  2.48* 2.45* 2.13*  CALCIUM 8.6*  --  8.2* 8.1* 8.1*   Liver Function Tests: Recent Labs  Lab 03/25/22 0900 03/26/22 0411  AST 10* 10*  ALT 7 9  ALKPHOS 80 65  BILITOT 1.0 1.1  PROT 6.7 5.2*  ALBUMIN 3.4* 2.4*   CBG: Recent Labs  Lab 03/28/22 0205 03/28/22 0356 03/28/22 0424 03/28/22 0721 03/28/22 0839  GLUCAP 130* 59* 102* 80 98    Discharge time spent: approximately 35 minutes spent on discharge counseling, evaluation of patient on day of discharge, and coordination of discharge planning with nursing, social work, pharmacy and case management  Signed: Edwin Dada, MD [facility] 03/28/2022

## 2022-03-28 NOTE — Inpatient Diabetes Management (Signed)
Inpatient Diabetes Program Recommendations  AACE/ADA: New Consensus Statement on Inpatient Glycemic Control (2015)  Target Ranges:  Prepandial:   less than 140 mg/dL      Peak postprandial:   less than 180 mg/dL (1-2 hours)      Critically ill patients:  140 - 180 mg/dL   Lab Results  Component Value Date   GLUCAP 98 03/28/2022   HGBA1C 6.3 (H) 03/25/2022    Review of Glycemic Control  Latest Reference Range & Units 03/28/22 03:56 03/28/22 04:24 03/28/22 07:21 03/28/22 08:39  Glucose-Capillary 70 - 99 mg/dL 59 (L) 102 (H) 80 98  (L): Data is abnormally low (H): Data is abnormally high Diabetes history: Type 1 DM Outpatient Diabetes medications: Omnipod, Jardiance 10 mg QD Current orders for Inpatient glycemic control: Novolog 0-9 units TID & HS, Semglee 20 units QD  Inpatient Diabetes Program Recommendations:    Noted hypoglycemia of 59 mg/dL and subsequent insulin dose adjustment. In agreement.  Question appropriateness of Jardiance outpatient given A1C of 6.3%, GFR < 30 and chronic symptoms of gastroparesis. Secure chat sent to MD.  Thanks, Bronson Curb, MSN, RNC-OB Diabetes Coordinator [phone] (8a-5p)

## 2022-03-28 NOTE — Plan of Care (Signed)

## 2022-03-28 NOTE — Progress Notes (Signed)
AVS and discharge instructions reviewed w/ patient. Patient verbalized understanding and had no further questions. Medications stored in pharmacy retrieved and given back to patient.

## 2022-03-29 LAB — URINE CULTURE: Culture: >=100000 — AB

## 2022-04-01 ENCOUNTER — Other Ambulatory Visit: Payer: Self-pay

## 2022-04-01 ENCOUNTER — Encounter (HOSPITAL_COMMUNITY): Payer: Self-pay

## 2022-04-01 ENCOUNTER — Observation Stay (HOSPITAL_COMMUNITY)
Admission: EM | Admit: 2022-04-01 | Discharge: 2022-04-03 | Disposition: A | Discharge status: Home / Self Care | Payer: BC Managed Care – PPO | Attending: Internal Medicine | Admitting: Internal Medicine

## 2022-04-01 DIAGNOSIS — K920 Hematemesis: Secondary | ICD-10-CM | POA: Diagnosis not present

## 2022-04-01 DIAGNOSIS — I130 Hypertensive heart and chronic kidney disease with heart failure and stage 1 through stage 4 chronic kidney disease, or unspecified chronic kidney disease: Secondary | ICD-10-CM | POA: Insufficient documentation

## 2022-04-01 DIAGNOSIS — E1140 Type 2 diabetes mellitus with diabetic neuropathy, unspecified: Secondary | ICD-10-CM | POA: Diagnosis not present

## 2022-04-01 DIAGNOSIS — K317 Polyp of stomach and duodenum: Secondary | ICD-10-CM | POA: Diagnosis not present

## 2022-04-01 DIAGNOSIS — K3184 Gastroparesis: Secondary | ICD-10-CM

## 2022-04-01 DIAGNOSIS — I428 Other cardiomyopathies: Secondary | ICD-10-CM | POA: Diagnosis not present

## 2022-04-01 DIAGNOSIS — E1143 Type 2 diabetes mellitus with diabetic autonomic (poly)neuropathy: Secondary | ICD-10-CM | POA: Diagnosis not present

## 2022-04-01 DIAGNOSIS — F39 Unspecified mood [affective] disorder: Secondary | ICD-10-CM | POA: Diagnosis not present

## 2022-04-01 DIAGNOSIS — F32A Depression, unspecified: Secondary | ICD-10-CM | POA: Diagnosis not present

## 2022-04-01 DIAGNOSIS — Z79899 Other long term (current) drug therapy: Secondary | ICD-10-CM | POA: Diagnosis not present

## 2022-04-01 DIAGNOSIS — E1022 Type 1 diabetes mellitus with diabetic chronic kidney disease: Secondary | ICD-10-CM | POA: Diagnosis not present

## 2022-04-01 DIAGNOSIS — N1832 Chronic kidney disease, stage 3b: Secondary | ICD-10-CM

## 2022-04-01 DIAGNOSIS — E1043 Type 1 diabetes mellitus with diabetic autonomic (poly)neuropathy: Secondary | ICD-10-CM | POA: Diagnosis not present

## 2022-04-01 DIAGNOSIS — D509 Iron deficiency anemia, unspecified: Secondary | ICD-10-CM | POA: Diagnosis not present

## 2022-04-01 DIAGNOSIS — K922 Gastrointestinal hemorrhage, unspecified: Secondary | ICD-10-CM | POA: Diagnosis not present

## 2022-04-01 DIAGNOSIS — K2101 Gastro-esophageal reflux disease with esophagitis, with bleeding: Secondary | ICD-10-CM | POA: Diagnosis not present

## 2022-04-01 DIAGNOSIS — N179 Acute kidney failure, unspecified: Secondary | ICD-10-CM | POA: Diagnosis not present

## 2022-04-01 DIAGNOSIS — I462 Cardiac arrest due to underlying cardiac condition: Secondary | ICD-10-CM | POA: Diagnosis not present

## 2022-04-01 DIAGNOSIS — R55 Syncope and collapse: Secondary | ICD-10-CM | POA: Diagnosis not present

## 2022-04-01 DIAGNOSIS — K209 Esophagitis, unspecified without bleeding: Secondary | ICD-10-CM

## 2022-04-01 DIAGNOSIS — I10 Essential (primary) hypertension: Secondary | ICD-10-CM | POA: Diagnosis not present

## 2022-04-01 DIAGNOSIS — R112 Nausea with vomiting, unspecified: Secondary | ICD-10-CM

## 2022-04-01 DIAGNOSIS — N184 Chronic kidney disease, stage 4 (severe): Secondary | ICD-10-CM | POA: Diagnosis not present

## 2022-04-01 DIAGNOSIS — I472 Ventricular tachycardia, unspecified: Secondary | ICD-10-CM | POA: Diagnosis not present

## 2022-04-01 DIAGNOSIS — Z20822 Contact with and (suspected) exposure to covid-19: Secondary | ICD-10-CM | POA: Diagnosis not present

## 2022-04-01 DIAGNOSIS — E1122 Type 2 diabetes mellitus with diabetic chronic kidney disease: Secondary | ICD-10-CM | POA: Diagnosis not present

## 2022-04-01 DIAGNOSIS — I5032 Chronic diastolic (congestive) heart failure: Secondary | ICD-10-CM

## 2022-04-01 DIAGNOSIS — I469 Cardiac arrest, cause unspecified: Secondary | ICD-10-CM | POA: Diagnosis not present

## 2022-04-01 DIAGNOSIS — J45909 Unspecified asthma, uncomplicated: Secondary | ICD-10-CM | POA: Insufficient documentation

## 2022-04-01 DIAGNOSIS — I5042 Chronic combined systolic (congestive) and diastolic (congestive) heart failure: Secondary | ICD-10-CM | POA: Diagnosis not present

## 2022-04-01 DIAGNOSIS — E1040 Type 1 diabetes mellitus with diabetic neuropathy, unspecified: Secondary | ICD-10-CM | POA: Diagnosis not present

## 2022-04-01 DIAGNOSIS — E872 Acidosis, unspecified: Secondary | ICD-10-CM | POA: Diagnosis not present

## 2022-04-01 DIAGNOSIS — I4901 Ventricular fibrillation: Secondary | ICD-10-CM | POA: Diagnosis not present

## 2022-04-01 HISTORY — DX: Generalized abdominal pain: R10.84

## 2022-04-01 LAB — CBC WITH DIFFERENTIAL/PLATELET
Abs Immature Granulocytes: 0.02 10*3/uL (ref 0.00–0.07)
Basophils Absolute: 0 10*3/uL (ref 0.0–0.1)
Basophils Relative: 0 %
Eosinophils Absolute: 0 10*3/uL (ref 0.0–0.5)
Eosinophils Relative: 0 %
HCT: 32.9 % — ABNORMAL LOW (ref 36.0–46.0)
Hemoglobin: 10.7 g/dL — ABNORMAL LOW (ref 12.0–15.0)
Immature Granulocytes: 0 %
Lymphocytes Relative: 13 %
Lymphs Abs: 0.9 10*3/uL (ref 0.7–4.0)
MCH: 28.1 pg (ref 26.0–34.0)
MCHC: 32.5 g/dL (ref 30.0–36.0)
MCV: 86.4 fL (ref 80.0–100.0)
Monocytes Absolute: 0.1 10*3/uL (ref 0.1–1.0)
Monocytes Relative: 2 %
Neutro Abs: 5.9 10*3/uL (ref 1.7–7.7)
Neutrophils Relative %: 85 %
Platelets: 294 10*3/uL (ref 150–400)
RBC: 3.81 MIL/uL — ABNORMAL LOW (ref 3.87–5.11)
RDW: 14.1 % (ref 11.5–15.5)
WBC: 7 10*3/uL (ref 4.0–10.5)
nRBC: 0 % (ref 0.0–0.2)

## 2022-04-01 LAB — TYPE AND SCREEN
ABO/RH(D): A POS
Antibody Screen: NEGATIVE

## 2022-04-01 LAB — CBC
HCT: 29.9 % — ABNORMAL LOW (ref 36.0–46.0)
Hemoglobin: 9.5 g/dL — ABNORMAL LOW (ref 12.0–15.0)
MCH: 27.9 pg (ref 26.0–34.0)
MCHC: 31.8 g/dL (ref 30.0–36.0)
MCV: 87.9 fL (ref 80.0–100.0)
Platelets: 245 10*3/uL (ref 150–400)
RBC: 3.4 MIL/uL — ABNORMAL LOW (ref 3.87–5.11)
RDW: 14.2 % (ref 11.5–15.5)
WBC: 7.9 10*3/uL (ref 4.0–10.5)
nRBC: 0 % (ref 0.0–0.2)

## 2022-04-01 LAB — POC OCCULT BLOOD, ED: Fecal Occult Bld: POSITIVE — AB

## 2022-04-01 LAB — COMPREHENSIVE METABOLIC PANEL
ALT: 13 U/L (ref 0–44)
AST: 15 U/L (ref 15–41)
Albumin: 3 g/dL — ABNORMAL LOW (ref 3.5–5.0)
Alkaline Phosphatase: 69 U/L (ref 38–126)
Anion gap: 9 (ref 5–15)
BUN: 20 mg/dL (ref 6–20)
CO2: 20 mmol/L — ABNORMAL LOW (ref 22–32)
Calcium: 8.8 mg/dL — ABNORMAL LOW (ref 8.9–10.3)
Chloride: 114 mmol/L — ABNORMAL HIGH (ref 98–111)
Creatinine, Ser: 1.95 mg/dL — ABNORMAL HIGH (ref 0.44–1.00)
GFR, Estimated: 31 mL/min — ABNORMAL LOW (ref >60)
Glucose, Bld: 153 mg/dL — ABNORMAL HIGH (ref 70–99)
Potassium: 3.9 mmol/L (ref 3.5–5.1)
Sodium: 143 mmol/L (ref 135–145)
Total Bilirubin: 0.8 mg/dL (ref 0.3–1.2)
Total Protein: 6.4 g/dL — ABNORMAL LOW (ref 6.5–8.1)

## 2022-04-01 LAB — PROTIME-INR
INR: 0.9 (ref 0.8–1.2)
Prothrombin Time: 12.1 seconds (ref 11.4–15.2)

## 2022-04-01 LAB — CBG MONITORING, ED
Glucose-Capillary: 101 mg/dL — ABNORMAL HIGH (ref 70–99)
Glucose-Capillary: 155 mg/dL — ABNORMAL HIGH (ref 70–99)
Glucose-Capillary: 196 mg/dL — ABNORMAL HIGH (ref 70–99)

## 2022-04-01 MED ORDER — SODIUM CHLORIDE 0.9% FLUSH
3.0000 mL | Freq: Two times a day (BID) | INTRAVENOUS | Status: DC
  Administered 2022-04-01 – 2022-04-03 (×4): 3 mL via INTRAVENOUS

## 2022-04-01 MED ORDER — INSULIN ASPART 100 UNIT/ML IJ SOLN
0.0000 [IU] | INTRAMUSCULAR | Status: DC
  Administered 2022-04-02 – 2022-04-03 (×2): 2 [IU] via SUBCUTANEOUS
  Filled 2022-04-01: qty 0.15

## 2022-04-01 MED ORDER — ACETAMINOPHEN 325 MG PO TABS
650.0000 mg | ORAL_TABLET | Freq: Four times a day (QID) | ORAL | Status: DC | PRN
  Administered 2022-04-02 – 2022-04-03 (×3): 650 mg via ORAL
  Filled 2022-04-01 (×3): qty 2

## 2022-04-01 MED ORDER — LABETALOL HCL 5 MG/ML IV SOLN
10.0000 mg | INTRAVENOUS | Status: DC | PRN
Start: 2022-04-01 — End: 2022-04-01

## 2022-04-01 MED ORDER — SODIUM CHLORIDE 0.9 % IV BOLUS
500.0000 mL | Freq: Once | INTRAVENOUS | Status: AC
Start: 2022-04-01 — End: 2022-04-02
  Administered 2022-04-02: 500 mL via INTRAVENOUS

## 2022-04-01 MED ORDER — ACETAMINOPHEN 650 MG RE SUPP: 650.0000 mg | Freq: Four times a day (QID) | RECTAL | Status: DC | PRN

## 2022-04-01 MED ORDER — PANTOPRAZOLE 80MG IVPB - SIMPLE MED
80.0000 mg | Freq: Once | INTRAVENOUS | Status: AC
  Administered 2022-04-01: 80 mg via INTRAVENOUS
  Filled 2022-04-01: qty 80

## 2022-04-01 MED ORDER — SODIUM CHLORIDE 0.9 % IV SOLN
25.0000 mg | Freq: Four times a day (QID) | INTRAVENOUS | Status: DC | PRN
  Administered 2022-04-02: 25 mg via INTRAVENOUS

## 2022-04-01 MED ORDER — MORPHINE SULFATE (PF) 4 MG/ML IV SOLN
4.0000 mg | Freq: Once | INTRAVENOUS | Status: AC
  Administered 2022-04-01: 4 mg via INTRAVENOUS
  Filled 2022-04-01: qty 1

## 2022-04-01 MED ORDER — LABETALOL HCL 5 MG/ML IV SOLN
10.0000 mg | Freq: Once | INTRAVENOUS | Status: AC
  Administered 2022-04-01: 10 mg via INTRAVENOUS
  Filled 2022-04-01: qty 4

## 2022-04-01 MED ORDER — SODIUM CHLORIDE 0.9 % IV SOLN: INTRAVENOUS | Status: AC

## 2022-04-01 MED ORDER — METOCLOPRAMIDE HCL 5 MG/ML IJ SOLN
10.0000 mg | Freq: Three times a day (TID) | INTRAMUSCULAR | Status: DC
  Administered 2022-04-01 – 2022-04-03 (×4): 10 mg via INTRAVENOUS
  Filled 2022-04-01 (×4): qty 2

## 2022-04-01 MED ORDER — SODIUM CHLORIDE 0.9 % IV BOLUS
500.0000 mL | Freq: Once | INTRAVENOUS | Status: AC
  Administered 2022-04-01: 500 mL via INTRAVENOUS

## 2022-04-01 MED ORDER — PANTOPRAZOLE SODIUM 40 MG IV SOLR
40.0000 mg | Freq: Once | INTRAVENOUS | Status: AC
  Administered 2022-04-01: 40 mg via INTRAVENOUS
  Filled 2022-04-01: qty 10

## 2022-04-01 MED ORDER — LABETALOL HCL 5 MG/ML IV SOLN: 5.0000 mg | INTRAVENOUS | Status: DC | PRN

## 2022-04-01 MED ORDER — ONDANSETRON 4 MG PO TBDP
4.0000 mg | ORAL_TABLET | Freq: Once | ORAL | Status: AC
  Administered 2022-04-01: 4 mg via ORAL
  Filled 2022-04-01: qty 1

## 2022-04-01 MED ORDER — SODIUM CHLORIDE 0.9 % IV SOLN
25.0000 mg | Freq: Once | INTRAVENOUS | Status: AC
  Administered 2022-04-01: 25 mg via INTRAVENOUS
  Filled 2022-04-01: qty 1
  Filled 2022-04-01: qty 25

## 2022-04-01 MED ORDER — PANTOPRAZOLE INFUSION (NEW) - SIMPLE MED
8.0000 mg/h | INTRAVENOUS | Status: DC
  Administered 2022-04-01 – 2022-04-02 (×3): 8 mg/h via INTRAVENOUS
  Filled 2022-04-01: qty 100
  Filled 2022-04-01 (×2): qty 80
  Filled 2022-04-01: qty 100
  Filled 2022-04-01: qty 80
  Filled 2022-04-01 (×2): qty 100

## 2022-04-01 MED ORDER — INSULIN DETEMIR 100 UNIT/ML ~~LOC~~ SOLN
25.0000 [IU] | Freq: Every day | SUBCUTANEOUS | Status: DC
  Administered 2022-04-02 – 2022-04-03 (×2): 25 [IU] via SUBCUTANEOUS
  Filled 2022-04-01 (×2): qty 0.25

## 2022-04-01 NOTE — ED Triage Notes (Signed)
Patient is vomiting a mixture of bright red emesis and dark red emesis  that started today, but has been vomiting x 3 days. Patient recently discharged from he hospital for the same. Patient also c/o abdominal pain.

## 2022-04-01 NOTE — H&P (Signed)
History and Physical   [name] MGQ:[id number] DOB: 1970/11/30 DOA: 04/01/2022  PCP: [name], PA   Patient coming from: Home  Chief Complaint: Hematemesis  HPI: [name] is a 51 y.o. female with medical history significant of diabetes with neuropathy, gastroparesis, ulcers; mood disorder; dysfunctional uterine bleeding; CKD 3B; diastolic CHF; anemia presenting with hematemesis, nausea vomiting, abdominal pain.  Patient reports 3 days of nausea vomiting and abdominal pain.  Emesis is now very frequent and has developed initially bright red hematemesis and now mixed with coffee-ground like hematemesis.  Recently admitted from 8/21 until 8/24 with a gastroparesis flare and AKI.  No previous hematemesis.  Denies significant NSAID use or alcohol use.  Denies fevers, chills, chest pain, shortness of breath, constipation, diarrhea.  ED Course: Vital signs in ED significant for heart rate in the 100s, blood pressure in the 267T to 245 systolic, respiratory rate in the teens to 20s.  Lab work-up included CMP with chloride 114, bicarb 20, creatinine stable at 1.95, glucose 153.  Calcium 8.8, protein 6.4, albumin 3.0.  Hemoglobin currently stable 10.7.  PT and INR normal.  Vomitus was Hemoccult positive.  Patient typed and screened in the ED.  Patient received morphine, labetalol, IV PPI and started on a PPI drip also received a dose of Phenergan.  Message sent to GI for consultation tomorrow.  Review of Systems: As per HPI otherwise all other systems reviewed and are negative.  Past Medical History:  Diagnosis Date   Anemia    Anxiety    Arthritis    Asthma    CHF (congestive heart failure) ([place])    Depression    Diabetic ulcer of left foot ([place]) 05/12/2013   Generalized abdominal pain    History of chicken pox    Loss of weight 12/02/2019   Migraines    Mood disorder ([place])    anxiety   Prurigo nodularis    with diabetic dermopathy   Type 1 diabetes, uncontrolled, with  neuropathy    [place]   Ulcers of both lower legs ([place]) 02/20/2014    Past Surgical History:  Procedure Laterality Date   BIOPSY  08/11/2020   Procedure: BIOPSY;  Surgeon: Ladene Artist, MD;  Location: [facility];  Service: Endoscopy;;   ESOPHAGOGASTRODUODENOSCOPY (EGD) WITH PROPOFOL N/A 08/11/2020   Procedure: ESOPHAGOGASTRODUODENOSCOPY (EGD) WITH PROPOFOL;  Surgeon: Ladene Artist, MD;  Location: [facility];  Service: Endoscopy;  Laterality: N/A;   treadmill stress test  01/2013   WNL, low risk study    Social History  reports that she has never smoked. She has never used smokeless tobacco. She reports that she does not currently use alcohol. She reports that she does not use drugs.  Allergies  Allergen Reactions   Amoxicillin Itching   Atorvastatin     Dizziness    Dulaglutide Nausea And Vomiting   Lantus [Insulin Glargine] Itching and Rash   Miconazole Nitrate Rash    Family History  Problem Relation Age of Onset   Diabetes Mother        type 2   Hypertension Mother    Thyroid disease Mother    Bipolar disorder Mother    Heart disease Mother    Calcium disorder Mother    Cancer Maternal Grandmother        Breast, stomach   Cancer Paternal Grandmother        stomach, lung (smoker)   Diabetes Paternal Grandmother    Diabetes Paternal Grandfather  Heart failure Sister    Diabetes Sister    Stroke Maternal Aunt    Cancer Maternal Uncle        prostate   CAD Maternal Aunt        stents   Cancer Maternal Aunt 64       ovarian  Reviewed on admission  Prior to Admission medications   Medication Sig Start Date End Date Taking? Authorizing Provider  acetaminophen (TYLENOL) 500 MG tablet Take 500 mg by mouth every 6 (six) hours as needed for moderate pain.    [provider]  albuterol (PROAIR HFA) 108 (90 Base) MCG/ACT inhaler Inhale 2 puffs into the lungs every 6 (six) hours as needed for wheezing or shortness of breath. 07/03/20   [name],  PA-C  Carboxymethylcellul-Glycerin (LUBRICATING EYE DROPS OP) Place 1 drop into both eyes daily as needed (dry eyes).    [provider]  carvedilol (COREG) 12.5 MG tablet Take 1 tablet (12.5 mg total) by mouth 2 (two) times daily. 11/28/21   [name], MD  Continuous Blood Gluc Sensor (FREESTYLE LIBRE 2 SENSOR) MISC CHANGE EVERY 14 DAYS 03/20/20   [provider]  feeding supplement, GLUCERNA SHAKE, (GLUCERNA SHAKE) LIQD Take 237 mLs by mouth 2 (two) times daily between meals. Patient taking differently: Take 237 mLs by mouth 2 (two) times daily as needed (nutrition). 08/16/20   Mariel Aloe, MD  hydrOXYzine (ATARAX) 50 MG tablet Take 50 mg by mouth in the morning, at noon, and at bedtime. 10/01/21   [provider]  Insulin Disposable Pump (OMNIPOD 10 PACK) MISC by Does not apply route.    [provider]  NOVOLOG 100 UNIT/ML injection Inject 0-120 Units into the skin daily. 04/06/20   [provider]  polyethylene glycol (MIRALAX / GLYCOLAX) 17 g packet Take 17 g by mouth daily as needed for moderate constipation.    [provider]  pregabalin (LYRICA) 50 MG capsule Take 50 mg by mouth 2 (two) times daily as needed (neuropathy). 12/05/21   [provider]  tobramycin (TOBREX) 0.3 % ophthalmic solution Place 1 drop into both eyes See admin instructions. Instill 1-2 drop into both eyes 3 times daily the day before, of, and the day after monthly eye injections 11/17/20   [provider]  torsemide (DEMADEX) 20 MG tablet Take 20 mg by mouth daily as needed (swelling). 12/25/21   [provider]  TRESIBA FLEXTOUCH 200 UNIT/ML FlexTouch Pen Inject 50 Units into the skin daily as needed (when insulin pump stops working). 11/04/21   [provider]    Physical Exam: Vitals:   04/01/22 2110 04/01/22 2122 04/01/22 2127 04/01/22 2130  BP: (!) 194/112 (!) 186/105  (!) 165/88  Pulse: (!) 109 (!) 108  96  Resp: 19 16   (!) 26  Temp:   98.6 F (37 C)   TempSrc:   Oral   SpO2: 99% 99%  94%  Weight:      Height:        Physical Exam Constitutional:      General: She is not in acute distress.    Appearance: Normal appearance.  HENT:     Head: Normocephalic and atraumatic.     Mouth/Throat:     Mouth: Mucous membranes are moist.     Pharynx: Oropharynx is clear.  Eyes:     Extraocular Movements: Extraocular movements intact.     Pupils: Pupils are equal, round, and reactive to light.  Cardiovascular:     Rate and Rhythm: Normal rate and regular rhythm.     Pulses: Normal pulses.     Heart sounds: Normal heart sounds.  Pulmonary:     Effort: Pulmonary effort is normal. No respiratory distress.     Breath sounds: Normal breath sounds.  Abdominal:     General: Bowel sounds are normal. There is no distension.     Palpations: Abdomen is soft.     Tenderness: There is no abdominal tenderness.  Musculoskeletal:        General: No swelling or deformity.  Skin:    General: Skin is warm and dry.  Neurological:     General: No focal deficit present.     Mental Status: Mental status is at baseline.    Labs on Admission: I have personally reviewed following labs and imaging studies  CBC: Recent Labs  Lab 03/26/22 0411 03/27/22 0429 04/01/22 1900  WBC 6.1 5.2 7.0  NEUTROABS  --   --  5.9  HGB 9.2* 9.0* 10.7*  HCT 29.7* 27.9* 32.9*  MCV 90.8 87.5 86.4  PLT 235 257 809    Basic Metabolic Panel: Recent Labs  Lab 03/26/22 0411 03/27/22 0429 03/28/22 0426 04/01/22 1900  NA 143 141 139 143  K 3.8 3.7 4.1 3.9  CL 113* 110 111 114*  CO2 $Re'24 25 25 'Mvc$ 20*  GLUCOSE 114* 79 107* 153*  BUN 22* 28* 32* 20  CREATININE 2.48* 2.45* 2.13* 1.95*  CALCIUM 8.2* 8.1* 8.1* 8.8*    GFR: Estimated Creatinine Clearance: 39.4 mL/min (A) (by C-G formula based on SCr of 1.95 mg/dL (H)).  Liver Function Tests: Recent Labs  Lab 03/26/22 0411 04/01/22 1900  AST 10* 15  ALT 9 13  ALKPHOS 65 69  BILITOT  1.1 0.8  PROT 5.2* 6.4*  ALBUMIN 2.4* 3.0*    Urine analysis:    Component Value Date/Time   COLORURINE YELLOW 03/27/2022 1054   APPEARANCEUR CLEAR 03/27/2022 1054   LABSPEC 1.009 03/27/2022 1054   PHURINE 7.0 03/27/2022 1054   GLUCOSEU NEGATIVE 03/27/2022 1054   [place] 03/27/2022 1054   [place] 03/27/2022 1054   BILIRUBINUR negative 08/30/2020 1631   KETONESUR NEGATIVE 03/27/2022 1054   PROTEINUR >=300 (A) 03/27/2022 1054   UROBILINOGEN 0.2 08/30/2020 1631   UROBILINOGEN 1.0 08/11/2008 1745   NITRITE NEGATIVE 03/27/2022 1054   LEUKOCYTESUR MODERATE (A) 03/27/2022 1054    Radiological Exams on Admission: No results found.  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 > Suspicious for possible Mallory-Weiss tear given significant vomiting versus other. > Started on IV PPI and then PPI drip in the ED.  They sent a message to GI for consultation in the morning.  Currently remains hemodynamically stable.  Patient has been typed and screened.  Hemoglobin stable at 10.7. - Monitor on telemetry - GI recommendations - Continue IV PPI drip - Trend CBC overnight, transfuse hemoglobin less than 7 - N.p.o.  -  Supportive care Addendum > Blood pressure has become low normal by time I saw the patient. - We will give small IV fluid bolus - Move patient to progressive unit and start rechecking  CBC now  Gastroparesis flare > Several days of persistent nausea and vomiting.  Similar episode requiring admission secondary to this and AKI earlier this month. - Scheduled IV Reglan - As needed Phenergan  Diabetes > Complicated by gastroparesis, extremity ulcers, neuropathy - 25U long acting insulin daily -SSI - Treat gastroparesis as above - Holding home Lyrica while n.p.o.  Hypertension > Do not see a diagnosis of this on her chart however is significantly hypertensive in the ED. - Holding home Coreg in setting n.p.o. - As needed labetalol Addendum > Blood pressure has become low normal by time I saw the patient. - We will give small IV fluid bolus - Move patient to progressive unit and start rechecking CBC now  Diastolic CHF > Echo earlier this year with EF 50-55%, G2 DD, normal RV function. > Currently on Lasix as needed - Holding home Lasix while n.p.o.  DVT prophylaxis: SCDs Code Status:   Full Family Communication:  None On admission. Disposition Plan:   Patient is from:  Home  Anticipated DC to:  Home  Anticipated DC date:  1 to 3 days  Anticipated DC barriers: None  Consults called:  Message sent to GI by EDP. Admission status:  Observation, telemetry  Severity of Illness: The appropriate patient status for this patient is OBSERVATION. Observation status is judged to be reasonable and necessary in order to provide the required intensity of service to ensure the patient's safety. The patient's presenting symptoms, physical exam findings, and initial radiographic and laboratory data in the context of their medical condition is felt to place them at decreased risk for further clinical deterioration. Furthermore, it is anticipated that the patient will be medically stable for discharge from the hospital within 2 midnights of admission.    Marcelyn Bruins MD [facility]  How to contact the [facility] Attending or Consulting provider [place] or covering provider  during after hours [place], for this patient?   Check the care team in [facility] and look for a) attending/consulting [facility] provider listed and b) the [facility] team listed Log into [email or web address] and use [facility] universal password to access. If you do not have the password, please contact the hospital operator. Locate the [facility] provider you are looking for under [facility] and page to a number that you can be directly reached. If you still have difficulty reaching the provider, please page the [facility] (Director on Call) for the Hospitalists listed on amion for assistance.  04/01/2022, 9:40 PM

## 2022-04-01 NOTE — ED Provider Notes (Signed)
[facility] DEPT Provider Note   CSN: [id number] Arrival date & time: 04/01/22  1646     History  Chief Complaint  Patient presents with   Hematemesis   Abdominal Pain   Hyperglycemia    [name] is a 51 y.o. female with a past medical history of poorly controlled type 1 diabetes and chronic kidney disease presenting today due to abdominal pain, nausea and hematemesis.  She reports that she started to feel nauseated on Friday and by Saturday morning she was having consistent emesis.  Today she has noted that her vomitus is either dark red or bright red in appearance.  No thinners, alcohol use, history of ulcers or varices.   Abdominal Pain Hyperglycemia Associated symptoms: abdominal pain        Home Medications Prior to Admission medications   Medication Sig Start Date End Date Taking? Authorizing Provider  acetaminophen (TYLENOL) 500 MG tablet Take 500 mg by mouth every 6 (six) hours as needed for moderate pain.    [provider]  albuterol (PROAIR HFA) 108 (90 Base) MCG/ACT inhaler Inhale 2 puffs into the lungs every 6 (six) hours as needed for wheezing or shortness of breath. 07/03/20   [name], PA-C  Carboxymethylcellul-Glycerin (LUBRICATING EYE DROPS OP) Place 1 drop into both eyes daily as needed (dry eyes).    [provider]  carvedilol (COREG) 12.5 MG tablet Take 1 tablet (12.5 mg total) by mouth 2 (two) times daily. 11/28/21   [name], MD  Continuous Blood Gluc Sensor (FREESTYLE LIBRE 2 SENSOR) MISC CHANGE EVERY 14 DAYS 03/20/20   [provider]  feeding supplement, GLUCERNA SHAKE, (GLUCERNA SHAKE) LIQD Take 237 mLs by mouth 2 (two) times daily between meals. Patient taking differently: Take 237 mLs by mouth 2 (two) times daily as needed (nutrition). 08/16/20   Mariel Aloe, MD  hydrOXYzine (ATARAX) 50 MG tablet Take 50 mg by mouth in the morning, at noon, and at bedtime. 10/01/21    [provider]  Insulin Disposable Pump (OMNIPOD 10 PACK) MISC by Does not apply route.    [provider]  NOVOLOG 100 UNIT/ML injection Inject 0-120 Units into the skin daily. 04/06/20   [provider]  polyethylene glycol (MIRALAX / GLYCOLAX) 17 g packet Take 17 g by mouth daily as needed for moderate constipation.    [provider]  pregabalin (LYRICA) 50 MG capsule Take 50 mg by mouth 2 (two) times daily as needed (neuropathy). 12/05/21   [provider]  tobramycin (TOBREX) 0.3 % ophthalmic solution Place 1 drop into both eyes See admin instructions. Instill 1-2 drop into both eyes 3 times daily the day before, of, and the day after monthly eye injections 11/17/20   [provider]  torsemide (DEMADEX) 20 MG tablet Take 20 mg by mouth daily as needed (swelling). 12/25/21   [provider]  TRESIBA FLEXTOUCH 200 UNIT/ML FlexTouch Pen Inject 50 Units into the skin daily as needed (when insulin pump stops working). 11/04/21   [provider]      Allergies    Amoxicillin, Atorvastatin, Dulaglutide, Lantus [insulin glargine], and Miconazole nitrate    Review of Systems   Review of Systems  Gastrointestinal:  Positive for abdominal pain.    Physical Exam Updated Vital Signs BP (!) 207/114 (BP Location: Right Arm)   Pulse (!) 104   Temp 98 F (36.7 C) (Oral)   Resp 20   Ht 6' (1.829 m)  Wt 83.7 kg   LMP 11/17/2021 (Approximate)   SpO2 100%   BMI 25.04 kg/m  Physical Exam Vitals and nursing note reviewed.  Constitutional:      General: She is in acute distress (Noticeably uncomfortable on the stretcher, actively vomiting and crying).     Appearance: Normal appearance.  HENT:     Head: Normocephalic and atraumatic.     Mouth/Throat:     Mouth: Mucous membranes are moist.     Pharynx: Oropharynx is clear.  Eyes:     General: No scleral icterus.    Conjunctiva/sclera: Conjunctivae normal.  Pulmonary:      Effort: Pulmonary effort is normal. No respiratory distress.  Abdominal:     General: Abdomen is flat.     Palpations: Abdomen is soft.     Tenderness: There is abdominal tenderness in the epigastric area.     Hernia: No hernia is present.  Skin:    Findings: No rash.  Neurological:     Mental Status: She is alert.  Psychiatric:        Mood and Affect: Mood normal.     ED Results / Procedures / Treatments   Labs (all labs ordered are listed, but only abnormal results are displayed) Labs Reviewed  COMPREHENSIVE METABOLIC PANEL - Abnormal; Notable for the following components:      Result Value   Chloride 114 (*)    CO2 20 (*)    Glucose, Bld 153 (*)    Creatinine, Ser 1.95 (*)    Calcium 8.8 (*)    Total Protein 6.4 (*)    Albumin 3.0 (*)    GFR, Estimated 31 (*)    All other components within normal limits  CBC WITH DIFFERENTIAL/PLATELET - Abnormal; Notable for the following components:   RBC 3.81 (*)    Hemoglobin 10.7 (*)    HCT 32.9 (*)    All other components within normal limits  CBG MONITORING, ED - Abnormal; Notable for the following components:   Glucose-Capillary 101 (*)    All other components within normal limits  POC OCCULT BLOOD, ED - Abnormal; Notable for the following components:   Fecal Occult Bld POSITIVE (*)    All other components within normal limits  CBG MONITORING, ED - Abnormal; Notable for the following components:   Glucose-Capillary 155 (*)    All other components within normal limits  PROTIME-INR  CBC  CBC  BASIC METABOLIC PANEL  TYPE AND SCREEN    EKG None  Radiology No results found.  Procedures Procedures   Medications Ordered in ED Medications  promethazine (PHENERGAN) 25 mg in sodium chloride 0.9 % 50 mL IVPB (25 mg Intravenous New Bag/Given 04/01/22 2126)  pantoprozole (PROTONIX) 80 mg /NS 100 mL infusion (8 mg/hr Intravenous New Bag/Given 04/01/22 1949)  sodium chloride flush (NS) 0.9 % injection 3 mL (has no  administration in time range)  0.9 %  sodium chloride infusion (has no administration in time range)  acetaminophen (TYLENOL) tablet 650 mg (has no administration in time range)    Or  acetaminophen (TYLENOL) suppository 650 mg (has no administration in time range)  labetalol (NORMODYNE) injection 10 mg (has no administration in time range)  pantoprazole (PROTONIX) injection 40 mg (40 mg Intravenous Given 04/01/22 1859)  pantoprazole (PROTONIX) 80 mg /NS 100 mL IVPB (0 mg Intravenous Stopped 04/01/22 2013)  ondansetron (ZOFRAN-ODT) disintegrating tablet 4 mg (4 mg Oral Given 04/01/22 2050)  labetalol (NORMODYNE) injection 10 mg (10 mg Intravenous Given 04/01/22  2118)  morphine (PF) 4 MG/ML injection 4 mg (4 mg Intravenous Given 04/01/22 2118)    ED Course/ Medical Decision Making/ A&P Clinical Course as of 04/01/22 2106  Mon Apr 01, 2022  2056 Re-evaluated and with coffee ground emesis on pillow case. No longer vomiting but says she is still nauseous and with epigastric pain [MR]    Clinical Course User Index [MR] Dantae Meunier, Cecilio Asper, PA-C                           Medical Decision Making Amount and/or Complexity of Data Reviewed Labs: ordered.  Risk Prescription drug management. Decision regarding hospitalization.   This is a 51 year old female with a past medical history of gastroparesis in the setting of poorly controlled type 1 diabetes who presents to the ED for concern of abdominal pain and hematemesis.  Differential includes but is not limited to UTI bleed, lower GI bleed, diverticular bleed, gastritis, esophagitis, varices, coagulopathy, and NSAID/alcohol use.   This is not an exhaustive differential.    Past Medical History / Co-morbidities / Social History: Poorly controlled type 1 diabetes, DKA and gastroparesis   Additional history: -I reviewed patient's hospitalization from 8/21 to 8/24.  She was admitted for an AKI in the setting of a gastroparesis flare.  -Per  external chart review, patient saw gastroenterology in February of last year.  She had chronic esophagitis for which she was on daily PPI.   Physical Exam: Pertinent physical exam findings include Actively vomiting upon evaluation.  Coffee-ground emesis noted in emesis bag. Epigastric tenderness  Lab Tests: I ordered, and personally interpreted labs.  The pertinent results include: Blood glucose 101 Hemoglobin 10.7, up from 9 upon discharge 4 days ago   Imaging Studies: Considered CT imaging however patient's tenderness is epigastric.  More likely gastritis/esophagitis leading to hematemesis.    Medications: I ordered medication including Phenergan, morphine and Protonix. Reevaluation of the patient after these medicines showed that the patient improved.  Patient now sleeping.   Consultations Obtained: I secure chatted GI to make them aware of the patient.  MDM/Disposition: This is a 51 year old female with a past medical history of uncontrolled type 1 diabetes and gastroparesis presenting today due to hematemesis.  Recently discharged from the hospital due to AKI secondary to gastroparesis.  She says since Friday she has been vomiting and earlier today she started to experience hematemesis.  Started out bright red and now is darker.  No history of GI bleed.  Lab work is largely benign.  Stable from discharge.  Occult card performed on patient's emesis and positive.  Believe she needs to see GI and qualifies for admission due to upper GI bleed and gastroparesis.  I suspect Mallory-Weiss due to history of gastroparesis and frequent, long-term vomiting.  Will be further worked up while in the hospital.     I discussed this case with my attending physician Dr. Roderic Palau who cosigned this note including patient's presenting symptoms, physical exam, and planned diagnostics and interventions. Attending physician stated agreement with plan or made changes to plan which were implemented.       Final Clinical Impression(s) / ED Diagnoses Final diagnoses:  Gastrointestinal hemorrhage, unspecified gastrointestinal hemorrhage type    Rx / DC Orders  Admit to Dr. Verdis Prime, [place], PA-C 04/01/22 2135    Milton Ferguson, MD 04/03/22 1041

## 2022-04-02 ENCOUNTER — Encounter (HOSPITAL_COMMUNITY): Payer: Self-pay | Admitting: Internal Medicine

## 2022-04-02 ENCOUNTER — Observation Stay (HOSPITAL_COMMUNITY): Payer: BC Managed Care – PPO | Admitting: Certified Registered"

## 2022-04-02 ENCOUNTER — Encounter (HOSPITAL_COMMUNITY)
Admission: EM | Disposition: A | Discharge status: Home / Self Care | Payer: Self-pay | Source: Home / Self Care | Attending: Emergency Medicine

## 2022-04-02 DIAGNOSIS — E872 Acidosis, unspecified: Secondary | ICD-10-CM | POA: Diagnosis not present

## 2022-04-02 DIAGNOSIS — K317 Polyp of stomach and duodenum: Secondary | ICD-10-CM

## 2022-04-02 DIAGNOSIS — J45909 Unspecified asthma, uncomplicated: Secondary | ICD-10-CM | POA: Diagnosis not present

## 2022-04-02 DIAGNOSIS — F39 Unspecified mood [affective] disorder: Secondary | ICD-10-CM | POA: Diagnosis not present

## 2022-04-02 DIAGNOSIS — E1143 Type 2 diabetes mellitus with diabetic autonomic (poly)neuropathy: Secondary | ICD-10-CM | POA: Diagnosis not present

## 2022-04-02 DIAGNOSIS — I509 Heart failure, unspecified: Secondary | ICD-10-CM | POA: Diagnosis not present

## 2022-04-02 DIAGNOSIS — I462 Cardiac arrest due to underlying cardiac condition: Secondary | ICD-10-CM | POA: Diagnosis not present

## 2022-04-02 DIAGNOSIS — I130 Hypertensive heart and chronic kidney disease with heart failure and stage 1 through stage 4 chronic kidney disease, or unspecified chronic kidney disease: Secondary | ICD-10-CM | POA: Diagnosis not present

## 2022-04-02 DIAGNOSIS — N179 Acute kidney failure, unspecified: Secondary | ICD-10-CM | POA: Diagnosis not present

## 2022-04-02 DIAGNOSIS — F32A Depression, unspecified: Secondary | ICD-10-CM | POA: Diagnosis not present

## 2022-04-02 DIAGNOSIS — K209 Esophagitis, unspecified without bleeding: Secondary | ICD-10-CM | POA: Diagnosis not present

## 2022-04-02 DIAGNOSIS — Z888 Allergy status to other drugs, medicaments and biological substances status: Secondary | ICD-10-CM | POA: Diagnosis not present

## 2022-04-02 DIAGNOSIS — R112 Nausea with vomiting, unspecified: Secondary | ICD-10-CM | POA: Diagnosis not present

## 2022-04-02 DIAGNOSIS — E1022 Type 1 diabetes mellitus with diabetic chronic kidney disease: Secondary | ICD-10-CM | POA: Diagnosis not present

## 2022-04-02 DIAGNOSIS — E1140 Type 2 diabetes mellitus with diabetic neuropathy, unspecified: Secondary | ICD-10-CM | POA: Diagnosis not present

## 2022-04-02 DIAGNOSIS — I428 Other cardiomyopathies: Secondary | ICD-10-CM | POA: Diagnosis not present

## 2022-04-02 DIAGNOSIS — N184 Chronic kidney disease, stage 4 (severe): Secondary | ICD-10-CM | POA: Diagnosis not present

## 2022-04-02 DIAGNOSIS — I4901 Ventricular fibrillation: Secondary | ICD-10-CM | POA: Diagnosis not present

## 2022-04-02 DIAGNOSIS — E1043 Type 1 diabetes mellitus with diabetic autonomic (poly)neuropathy: Secondary | ICD-10-CM | POA: Diagnosis not present

## 2022-04-02 DIAGNOSIS — I10 Essential (primary) hypertension: Secondary | ICD-10-CM | POA: Diagnosis not present

## 2022-04-02 DIAGNOSIS — Z833 Family history of diabetes mellitus: Secondary | ICD-10-CM | POA: Diagnosis not present

## 2022-04-02 DIAGNOSIS — K920 Hematemesis: Secondary | ICD-10-CM | POA: Diagnosis not present

## 2022-04-02 DIAGNOSIS — E1122 Type 2 diabetes mellitus with diabetic chronic kidney disease: Secondary | ICD-10-CM | POA: Diagnosis not present

## 2022-04-02 DIAGNOSIS — N281 Cyst of kidney, acquired: Secondary | ICD-10-CM | POA: Diagnosis not present

## 2022-04-02 DIAGNOSIS — I472 Ventricular tachycardia, unspecified: Secondary | ICD-10-CM | POA: Diagnosis not present

## 2022-04-02 DIAGNOSIS — N1831 Chronic kidney disease, stage 3a: Secondary | ICD-10-CM | POA: Diagnosis not present

## 2022-04-02 DIAGNOSIS — I5032 Chronic diastolic (congestive) heart failure: Secondary | ICD-10-CM | POA: Diagnosis not present

## 2022-04-02 DIAGNOSIS — D509 Iron deficiency anemia, unspecified: Secondary | ICD-10-CM | POA: Diagnosis not present

## 2022-04-02 DIAGNOSIS — Z794 Long term (current) use of insulin: Secondary | ICD-10-CM | POA: Diagnosis not present

## 2022-04-02 DIAGNOSIS — K2100 Gastro-esophageal reflux disease with esophagitis, without bleeding: Secondary | ICD-10-CM

## 2022-04-02 DIAGNOSIS — R0902 Hypoxemia: Secondary | ICD-10-CM | POA: Diagnosis not present

## 2022-04-02 DIAGNOSIS — K922 Gastrointestinal hemorrhage, unspecified: Secondary | ICD-10-CM

## 2022-04-02 DIAGNOSIS — E1165 Type 2 diabetes mellitus with hyperglycemia: Secondary | ICD-10-CM | POA: Diagnosis not present

## 2022-04-02 DIAGNOSIS — I5042 Chronic combined systolic (congestive) and diastolic (congestive) heart failure: Secondary | ICD-10-CM | POA: Diagnosis not present

## 2022-04-02 DIAGNOSIS — K2101 Gastro-esophageal reflux disease with esophagitis, with bleeding: Secondary | ICD-10-CM | POA: Diagnosis not present

## 2022-04-02 DIAGNOSIS — B379 Candidiasis, unspecified: Secondary | ICD-10-CM | POA: Diagnosis not present

## 2022-04-02 DIAGNOSIS — K3184 Gastroparesis: Secondary | ICD-10-CM | POA: Diagnosis not present

## 2022-04-02 DIAGNOSIS — J90 Pleural effusion, not elsewhere classified: Secondary | ICD-10-CM | POA: Diagnosis not present

## 2022-04-02 DIAGNOSIS — I110 Hypertensive heart disease with heart failure: Secondary | ICD-10-CM | POA: Diagnosis not present

## 2022-04-02 DIAGNOSIS — Z79899 Other long term (current) drug therapy: Secondary | ICD-10-CM | POA: Diagnosis not present

## 2022-04-02 DIAGNOSIS — R55 Syncope and collapse: Secondary | ICD-10-CM | POA: Diagnosis not present

## 2022-04-02 DIAGNOSIS — R519 Headache, unspecified: Secondary | ICD-10-CM | POA: Diagnosis not present

## 2022-04-02 DIAGNOSIS — J811 Chronic pulmonary edema: Secondary | ICD-10-CM | POA: Diagnosis not present

## 2022-04-02 DIAGNOSIS — N1832 Chronic kidney disease, stage 3b: Secondary | ICD-10-CM | POA: Diagnosis not present

## 2022-04-02 DIAGNOSIS — E1040 Type 1 diabetes mellitus with diabetic neuropathy, unspecified: Secondary | ICD-10-CM | POA: Diagnosis not present

## 2022-04-02 DIAGNOSIS — Z8349 Family history of other endocrine, nutritional and metabolic diseases: Secondary | ICD-10-CM | POA: Diagnosis not present

## 2022-04-02 DIAGNOSIS — R000 Tachycardia, unspecified: Secondary | ICD-10-CM | POA: Diagnosis not present

## 2022-04-02 DIAGNOSIS — Z20822 Contact with and (suspected) exposure to covid-19: Secondary | ICD-10-CM | POA: Diagnosis not present

## 2022-04-02 DIAGNOSIS — R0602 Shortness of breath: Secondary | ICD-10-CM | POA: Diagnosis not present

## 2022-04-02 DIAGNOSIS — Z8249 Family history of ischemic heart disease and other diseases of the circulatory system: Secondary | ICD-10-CM | POA: Diagnosis not present

## 2022-04-02 DIAGNOSIS — I469 Cardiac arrest, cause unspecified: Secondary | ICD-10-CM | POA: Diagnosis not present

## 2022-04-02 DIAGNOSIS — I5022 Chronic systolic (congestive) heart failure: Secondary | ICD-10-CM | POA: Diagnosis not present

## 2022-04-02 HISTORY — PX: BIOPSY: SHX5522

## 2022-04-02 HISTORY — PX: ESOPHAGOGASTRODUODENOSCOPY (EGD) WITH PROPOFOL: SHX5813

## 2022-04-02 LAB — BASIC METABOLIC PANEL
Anion gap: 6 (ref 5–15)
BUN: 23 mg/dL — ABNORMAL HIGH (ref 6–20)
CO2: 21 mmol/L — ABNORMAL LOW (ref 22–32)
Calcium: 8.2 mg/dL — ABNORMAL LOW (ref 8.9–10.3)
Chloride: 117 mmol/L — ABNORMAL HIGH (ref 98–111)
Creatinine, Ser: 2.28 mg/dL — ABNORMAL HIGH (ref 0.44–1.00)
GFR, Estimated: 25 mL/min — ABNORMAL LOW (ref >60)
Glucose, Bld: 173 mg/dL — ABNORMAL HIGH (ref 70–99)
Potassium: 4.3 mmol/L (ref 3.5–5.1)
Sodium: 144 mmol/L (ref 135–145)

## 2022-04-02 LAB — CBC
HCT: 31.6 % — ABNORMAL LOW (ref 36.0–46.0)
HCT: 28.4 % — ABNORMAL LOW (ref 36.0–46.0)
Hemoglobin: 9.6 g/dL — ABNORMAL LOW (ref 12.0–15.0)
Hemoglobin: 9.2 g/dL — ABNORMAL LOW (ref 12.0–15.0)
MCH: 28 pg (ref 26.0–34.0)
MCH: 28.4 pg (ref 26.0–34.0)
MCHC: 30.4 g/dL (ref 30.0–36.0)
MCHC: 32.4 g/dL (ref 30.0–36.0)
MCV: 92.1 fL (ref 80.0–100.0)
MCV: 87.7 fL (ref 80.0–100.0)
Platelets: 238 10*3/uL (ref 150–400)
Platelets: 266 10*3/uL (ref 150–400)
RBC: 3.43 MIL/uL — ABNORMAL LOW (ref 3.87–5.11)
RBC: 3.24 MIL/uL — ABNORMAL LOW (ref 3.87–5.11)
RDW: 14.5 % (ref 11.5–15.5)
RDW: 14.4 % (ref 11.5–15.5)
WBC: 7.6 10*3/uL (ref 4.0–10.5)
WBC: 6.8 10*3/uL (ref 4.0–10.5)
nRBC: 0 % (ref 0.0–0.2)
nRBC: 0 % (ref 0.0–0.2)

## 2022-04-02 LAB — CBG MONITORING, ED
Glucose-Capillary: 172 mg/dL — ABNORMAL HIGH (ref 70–99)
Glucose-Capillary: 167 mg/dL — ABNORMAL HIGH (ref 70–99)
Glucose-Capillary: 113 mg/dL — ABNORMAL HIGH (ref 70–99)
Glucose-Capillary: 135 mg/dL — ABNORMAL HIGH (ref 70–99)

## 2022-04-02 SURGERY — ESOPHAGOGASTRODUODENOSCOPY (EGD) WITH PROPOFOL
Anesthesia: Monitor Anesthesia Care

## 2022-04-02 MED ORDER — PROPOFOL 1000 MG/100ML IV EMUL
INTRAVENOUS | Status: AC
  Filled 2022-04-02: qty 100

## 2022-04-02 MED ORDER — PROPOFOL 500 MG/50ML IV EMUL
INTRAVENOUS | Status: DC | PRN
  Administered 2022-04-02 (×2): 50 mg via INTRAVENOUS
  Administered 2022-04-02: 75 ug/kg/min via INTRAVENOUS

## 2022-04-02 MED ORDER — SODIUM CHLORIDE 0.9 % IV SOLN: INTRAVENOUS | Status: DC

## 2022-04-02 SURGICAL SUPPLY — 15 items

## 2022-04-02 NOTE — Anesthesia Postprocedure Evaluation (Signed)
Anesthesia Post Note  Patient: [name]  Procedure(s) Performed: ESOPHAGOGASTRODUODENOSCOPY (EGD) WITH PROPOFOL BIOPSY     Patient location during evaluation: Endoscopy Anesthesia Type: MAC Level of consciousness: awake Pain management: pain level controlled Vital Signs Assessment: post-procedure vital signs reviewed and stable Respiratory status: spontaneous breathing Cardiovascular status: stable Postop Assessment: no apparent nausea or vomiting Anesthetic complications: no   No notable events documented.  Last Vitals:  Vitals:   04/02/22 1500 04/02/22 1506  BP: (!) 147/76 (!) 151/77  Pulse: 91 91  Resp: 16 20  Temp:    SpO2: 97% 97%    Last Pain:  Vitals:   04/02/22 1506  TempSrc:   PainSc: 0-No pain                 Huston Foley

## 2022-04-02 NOTE — ED Notes (Signed)
Back to room 17 from endo via stretcher. No n/v. No signs of distress. VSS

## 2022-04-02 NOTE — Consult Note (Addendum)
Referring Provider: Dr. Hanley Ben, [facility] Primary Care Physician:  Delma Officer, [place] Primary Gastroenterologist:  Dr. Myrtie Neither  Reason for Consultation:  Hematemesis  HPI: [name] is a 51 y.o. 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  She was just discharged from the hospital on 8/24 after a stay for AKI.  Had been started on Jardiance a few months ago to see if it would help with edema/fluid overload.  That was discontinued at recent hospital admission, however.  She says that she has not felt well for a couple of weeks.  Started vomiting coffee ground material yesterday.  Not on any acid reflux regimen at home but does admit to heartburn and reflux symptoms.  Has been started on PPI gtt, IV reglan, and prn phenergan.  Hgb is 9.6 grams with normal MCV, about at her baseline.  No NSAID use.  EGD 08/2020:  - LA Grade C reflux esophagitis with no bleeding. - Focal erythematous mucosa in the gastric fundus. Biopsied. - A few gastric polyps. Biopsied. - Normal duodenal bulb and second portion of the duodenum.  A. STOMACH, BODY, POLYPECTOMY:  - Fundic gland polyp.   B. STOMACH, BODY AND ANTRUM, BIOPSY:  - Gastric antral mucosa with mild reactive gastropathy.  - Gastric oxyntic mucosa with mild chronic gastritis.  - Warthin-Starry stain is negative for Helicobacter pylori.   C. STOMACH, FUNDUS, BIOPSY:  - Gastric oxyntic mucosa with mild chronic gastritis.   Has never had a colonoscopy.    Past Medical History:  Diagnosis Date   Anemia    Anxiety    Arthritis    Asthma    CHF (congestive heart failure) (HCC)    Depression    Diabetic ulcer of left foot (HCC) 05/12/2013   Generalized abdominal pain    History of chicken pox    Loss of weight 12/02/2019   Migraines    Mood disorder  (HCC)    anxiety   Prurigo nodularis    with diabetic dermopathy   Type 1 diabetes, uncontrolled, with neuropathy    [place]   Ulcers of both lower legs (HCC) 02/20/2014    Past Surgical History:  Procedure Laterality Date   BIOPSY  08/11/2020   Procedure: BIOPSY;  Surgeon: Meryl Dare, MD;  Location: [facility];  Service: Endoscopy;;   ESOPHAGOGASTRODUODENOSCOPY (EGD) WITH PROPOFOL N/A 08/11/2020   Procedure: ESOPHAGOGASTRODUODENOSCOPY (EGD) WITH PROPOFOL;  Surgeon: Meryl Dare, MD;  Location: [facility];  Service: Endoscopy;  Laterality: N/A;   treadmill stress test  01/2013   WNL, low risk study    Prior to Admission medications   Medication Sig Start Date End Date Taking? Authorizing Provider  acetaminophen (TYLENOL) 500 MG tablet Take 500 mg by mouth every 6 (six) hours as needed for moderate pain.   Yes [provider]  albuterol (PROAIR HFA) 108 (90 Base) MCG/ACT inhaler Inhale 2 puffs into the lungs every 6 (six) hours as needed for wheezing or shortness of breath. 07/03/20  Yes Waldon Merl, PA-C  Carboxymethylcellul-Glycerin (LUBRICATING EYE DROPS OP) Place 1 drop into both eyes daily as needed (dry eyes).   Yes [provider]  carvedilol (COREG) 12.5 MG tablet Take 1 tablet (12.5 mg total) by mouth 2 (two) times daily. 11/28/21  Yes Lyn Records, MD  hydrOXYzine (ATARAX) 50 MG tablet Take 50 mg by mouth  in the morning, at noon, and at bedtime. 10/01/21  Yes [provider]  NOVOLOG 100 UNIT/ML injection Inject 0-120 Units into the skin daily. 04/06/20  Yes [provider]  polyethylene glycol (MIRALAX / GLYCOLAX) 17 g packet Take 17 g by mouth daily as needed for moderate constipation.   Yes [provider]  pregabalin (LYRICA) 50 MG capsule Take 50 mg by mouth 2 (two) times daily as needed (neuropathy). 12/05/21  Yes [provider]  tobramycin (TOBREX) 0.3 % ophthalmic solution Place 1 drop into both eyes See  admin instructions. Instill 1-2 drop into both eyes 3 times daily the day before, of, and the day after monthly eye injections 11/17/20  Yes [provider]  torsemide (DEMADEX) 20 MG tablet Take 20 mg by mouth daily as needed (swelling). 12/25/21  Yes [provider]  TRESIBA FLEXTOUCH 200 UNIT/ML FlexTouch Pen Inject 50 Units into the skin daily as needed (when insulin pump stops working). 11/04/21  Yes [provider]  Continuous Blood Gluc Sensor (FREESTYLE LIBRE 2 SENSOR) MISC CHANGE EVERY 14 DAYS 03/20/20   [provider]  feeding supplement, GLUCERNA SHAKE, (GLUCERNA SHAKE) LIQD Take 237 mLs by mouth 2 (two) times daily between meals. Patient taking differently: Take 237 mLs by mouth 2 (two) times daily as needed (nutrition). 08/16/20   Mariel Aloe, MD  Insulin Disposable Pump (OMNIPOD 10 PACK) MISC by Does not apply route.    [provider]    Current Facility-Administered Medications  Medication Dose Route Frequency Provider Last Rate Last Admin   0.9 %  sodium chloride infusion   Intravenous Continuous Marcelyn Bruins, MD 125 mL/hr at 04/02/22 0659 New Bag at 04/02/22 0659   acetaminophen (TYLENOL) tablet 650 mg  650 mg Oral Q6H PRN Marcelyn Bruins, MD       Or   acetaminophen (TYLENOL) suppository 650 mg  650 mg Rectal Q6H PRN Marcelyn Bruins, MD       insulin aspart (novoLOG) injection 0-15 Units  0-15 Units Subcutaneous Q4H Marcelyn Bruins, MD       insulin detemir (LEVEMIR) injection 25 Units  25 Units Subcutaneous Daily Marcelyn Bruins, MD       labetalol (NORMODYNE) injection 5 mg  5 mg Intravenous Q2H PRN Marcelyn Bruins, MD       metoCLOPramide (REGLAN) injection 10 mg  10 mg Intravenous Q8H Marcelyn Bruins, MD   10 mg at 04/02/22 0553   pantoprozole (PROTONIX) 80 mg /NS 100 mL infusion  8 mg/hr Intravenous Continuous Marcelyn Bruins, MD 10 mL/hr at 04/02/22 0656 8 mg/hr at 04/02/22 0656   promethazine  (PHENERGAN) 25 mg in sodium chloride 0.9 % 50 mL IVPB  25 mg Intravenous Q6H PRN Marcelyn Bruins, MD   Stopped at 04/02/22 0406   sodium chloride flush (NS) 0.9 % injection 3 mL  3 mL Intravenous Q12H Marcelyn Bruins, MD   3 mL at 04/01/22 2236   Current Outpatient Medications  Medication Sig Dispense Refill   acetaminophen (TYLENOL) 500 MG tablet Take 500 mg by mouth every 6 (six) hours as needed for moderate pain.     albuterol (PROAIR HFA) 108 (90 Base) MCG/ACT inhaler Inhale 2 puffs into the lungs every 6 (six) hours as needed for wheezing or shortness of breath. 6.7 g 1   Carboxymethylcellul-Glycerin (LUBRICATING EYE DROPS OP) Place 1 drop into both eyes daily as needed (dry eyes).     carvedilol (  COREG) 12.5 MG tablet Take 1 tablet (12.5 mg total) by mouth 2 (two) times daily.     hydrOXYzine (ATARAX) 50 MG tablet Take 50 mg by mouth in the morning, at noon, and at bedtime.     NOVOLOG 100 UNIT/ML injection Inject 0-120 Units into the skin daily.     polyethylene glycol (MIRALAX / GLYCOLAX) 17 g packet Take 17 g by mouth daily as needed for moderate constipation.     pregabalin (LYRICA) 50 MG capsule Take 50 mg by mouth 2 (two) times daily as needed (neuropathy).     tobramycin (TOBREX) 0.3 % ophthalmic solution Place 1 drop into both eyes See admin instructions. Instill 1-2 drop into both eyes 3 times daily the day before, of, and the day after monthly eye injections     torsemide (DEMADEX) 20 MG tablet Take 20 mg by mouth daily as needed (swelling).     TRESIBA FLEXTOUCH 200 UNIT/ML FlexTouch Pen Inject 50 Units into the skin daily as needed (when insulin pump stops working).     Continuous Blood Gluc Sensor (FREESTYLE LIBRE 2 SENSOR) MISC CHANGE EVERY 14 DAYS     feeding supplement, GLUCERNA SHAKE, (GLUCERNA SHAKE) LIQD Take 237 mLs by mouth 2 (two) times daily between meals. (Patient taking differently: Take 237 mLs by mouth 2 (two) times daily as needed (nutrition).)  0   Insulin  Disposable Pump (OMNIPOD 10 PACK) MISC by Does not apply route.      Allergies as of 04/01/2022 - Review Complete 04/01/2022  Allergen Reaction Noted   Amoxicillin Itching 11/25/2017   Atorvastatin  04/23/2021   Dulaglutide Nausea And Vomiting 05/23/2020   Lantus [insulin glargine] Itching and Rash 11/25/2017   Miconazole nitrate Rash 05/23/2020    Family History  Problem Relation Age of Onset   Diabetes Mother        type 2   Hypertension Mother    Thyroid disease Mother    Bipolar disorder Mother    Heart disease Mother    Calcium disorder Mother    Cancer Maternal Grandmother        Breast, stomach   Cancer Paternal Grandmother        stomach, lung (smoker)   Diabetes Paternal Grandmother    Diabetes Paternal Grandfather    Heart failure Sister    Diabetes Sister    Stroke Maternal Aunt    Cancer Maternal Uncle        prostate   CAD Maternal Aunt        stents   Cancer Maternal Aunt 64       ovarian    Social History   Socioeconomic History   Marital status: Divorced    Spouse name: Not on file   Number of children: 2   Years of education: Not on file   Highest education level: Not on file  Occupational History   Occupation: Freight forwarder  Tobacco Use   Smoking status: Never   Smokeless tobacco: Never  Vaping Use   Vaping Use: Never used  Substance and Sexual Activity   Alcohol use: Not Currently   Drug use: No   Sexual activity: Yes    Partners: Male    Birth control/protection: None  Other Topics Concern   Not on file  Social History Narrative   Caffeine use: daily   Occupation: cleans houses   Regular exercise: yes, active 5x/wk   Diet: good water, fruits/vegetables daily   Social Determinants of Radio broadcast assistant  Strain: Not on file  Food Insecurity: Not on file  Transportation Needs: Not on file  Physical Activity: Not on file  Stress: Not on file  Social Connections: Not on file  Intimate Partner Violence: Not on file     Review of Systems: ROS is O/W negative except as mentioned in HPI.  Physical Exam: Vital signs in last 24 hours: Temp:  [97.9 F (36.6 C)-98.7 F (37.1 C)] 98.7 F (37.1 C) (08/29 0728) Pulse Rate:  [78-110] 95 (08/29 0728) Resp:  [7-28] 20 (08/29 0728) BP: (107-207)/(53-124) 160/88 (08/29 0728) SpO2:  [91 %-100 %] 96 % (08/29 0728) Weight:  [83.7 kg] 83.7 kg (08/28 1820)   General:  Alert, Well-developed, well-nourished, pleasant and cooperative in NAD Head:  Normocephalic and atraumatic. Eyes:  Sclera clear, no icterus.  Conjunctiva pink. Ears:  Normal auditory acuity. Mouth:  No deformity or lesions.   Lungs:  Clear throughout to auscultation.  No wheezes, crackles, or rhonchi.  Heart:  Regular rate and rhythm; no murmurs, clicks, rubs, or gallops. Abdomen:  Soft, non-distended.  BS present.  Non-tender. Rectal:  Deferred.  Was heme positive by EDP.  Msk:  Symmetrical without gross deformities. Pulses:  Normal pulses noted. Extremities:  Without clubbing or edema. Neurologic:  Alert and oriented x 4;  grossly normal neurologically. Skin:  Intact without significant lesions or rashes. Psych:  Alert and cooperative. Normal mood and affect.  Intake/Output from previous day: 08/28 0701 - 08/29 0700 In: 258.8 [I.V.:79.2; IV Piggyback:179.6] Out: -   Lab Results: Recent Labs    04/01/22 1900 04/01/22 2231 04/02/22 0431  WBC 7.0 7.9 7.6  HGB 10.7* 9.5* 9.6*  HCT 32.9* 29.9* 31.6*  PLT 294 245 238   BMET Recent Labs    04/01/22 1900 04/02/22 0414  NA 143 144  K 3.9 4.3  CL 114* 117*  CO2 20* 21*  GLUCOSE 153* 173*  BUN 20 23*  CREATININE 1.95* 2.28*  CALCIUM 8.8* 8.2*   LFT Recent Labs    04/01/22 1900  PROT 6.4*  ALBUMIN 3.0*  AST 15  ALT 13  ALKPHOS 69  BILITOT 0.8   PT/INR Recent Labs    04/01/22 1900  LABPROT 12.1  INR 0.9   IMPRESSION:  *Nausea and vomiting with hematemesis in patient with 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 *History of Grade C esophagitis seen on EGD 08/2020, ? If it was due to reflux or vomiting at that time.  Has not been on any acid reflux medications at home as of late. *Normocytic anemia:  Hgb 9.6 grams, MCV is normal.  This appears to be near her baseline and similar to a week ago.  Likely due to her CKD.  FOBT was positive on this occasion. *Chronic combined systolic (congestive) and diastolic (congestive) heart failure  *Stage 3b chronic kidney disease (CKD)  *Type 1 diabetes mellitus with diabetic neuropathy On insulin pump at home.    PLAN: -Continue Reglan 10 mg IV every 8 hours, PPI gtt for now, phenergan prn for now. -EGD later today. -Never had a colonoscopy so will need one as outpatient once able to tolerate bowel prep, etc.  Laban Emperor. Zehr  04/02/2022, 8:55 AM    Attending physician's note   I have taken history, reviewed the chart and examined the patient. I performed a substantive portion of this encounter, including complete performance of at least one of the key components, in conjunction with the APP. I agree with the  Advanced Practitioner's note, impression and recommendations.   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  Plan: -IV Protonix -IV Reglan -As needed Phenergan. -Trend CBC. -EGD today -GES as outpatient -Would also need colon as outpt for screening.   Carmell Austria, MD [facility] [phone]

## 2022-04-02 NOTE — Progress Notes (Signed)
PROGRESS NOTE    [name]  AJO:[id number] DOB: Jul 18, 1971 DOA: 04/01/2022 PCP: [name], PA   Brief Narrative:  51 y.o. female with medical history significant of diabetes mitis type I with neuropathy, suspected gastroparesis, mood disorder, dysfunctional uterine bleeding, CKD stage IIIb, chronic diastolic CHF, anemia and recent hospitalization from 03/25/2022-03/28/2022 for gastroparesis flare and AKI presented with hematemesis, nausea, vomiting and abdominal pain.  On presentation, blood pressure was elevated up to the 947S systolic.  Creatinine was 1.9, stable.  Hemoglobin 10.7.  She was started on IV PPI.  GI was consulted.  Assessment & Plan:   Possible upper GI bleeding presenting with hematemesis Anemia of chronic disease -Presented with hematemesis and coffee-ground emesis with nausea/vomiting and abdominal pain -Hemoglobin has remained stable and around her baseline.  Currently on Protonix drip.  GI evaluation pending.  Continue H&H monitoring.  CKD stage IIIb -During her recent hospitalization, creatinine was above 2.  Creatinine 1.95 on presentation.  Creatinine 2.28 today. -Repeat a.m. labs.  Change IV fluids to 75 cc an hour  Possible gastroparesis flare -Continue IV Reglan scheduled and as needed Phenergan.  Await GI recommendations  Diabetes mellitus type 1 with neuropathy -Continue long-acting insulin along with CBGs with SSI -Lyrica on hold while n.p.o.  Chronic diastolic CHF -Echo during last hospitalization had showed EF of 50 to 55% with grade 2 diastolic dysfunction.  Currently compensated.  Was recently discharged on torsemide as needed only.  Strict input and output.  Daily weights.  Outpatient follow-up with cardiology.  Hypertension -Blood pressure intermittently elevated.  Use IV antihypertensives as needed.  Oral Coreg on hold for now.  DVT prophylaxis: SCDs Code Status: Full Family Communication: None at bedside Disposition Plan: Status  is: Observation The patient will require care spanning > 2 midnights and should be moved to inpatient because: Of severity of illness  Consultants: GI  Procedures: None  Antimicrobials: None   Subjective: Patient seen and examined at bedside.  No current nausea or vomiting reported.  Denies any fever, chest pain or shortness of breath.  Objective: Vitals:   04/02/22 0715 04/02/22 0728 04/02/22 1015 04/02/22 1124  BP: (!) 178/106 (!) 160/88 (!) 166/93   Pulse: 99 95 94   Resp: $Remo'13 20 19   'JSfjV$ Temp:  98.7 F (37.1 C)  98.5 F (36.9 C)  TempSrc:  Oral  Oral  SpO2: 100% 96% 95%   Weight:      Height:        Intake/Output Summary (Last 24 hours) at 04/02/2022 1127 Last data filed at 04/02/2022 0406 Gross per 24 hour  Intake 258.79 ml  Output --  Net 258.79 ml   Filed Weights   04/01/22 1820  Weight: 83.7 kg    Examination:  General exam: Appears calm and comfortable.  Currently on room air. Respiratory system: Bilateral decreased breath sounds at bases Cardiovascular system: S1 & S2 heard, Rate controlled Gastrointestinal system: Abdomen is nondistended, soft and nontender. Normal bowel sounds heard. Extremities: No cyanosis, clubbing, edema  Central nervous system: Alert and oriented. No focal neurological deficits. Moving extremities Skin: No rashes, lesions or ulcers Psychiatry: Mostly flat affect.  No signs of agitation.   Data Reviewed: I have personally reviewed following labs and imaging studies  CBC: Recent Labs  Lab 03/27/22 0429 04/01/22 1900 04/01/22 2231 04/02/22 0431  WBC 5.2 7.0 7.9 7.6  NEUTROABS  --  5.9  --   --   HGB 9.0* 10.7* 9.5* 9.6*  HCT 27.9*  32.9* 29.9* 31.6*  MCV 87.5 86.4 87.9 92.1  PLT 257 294 245 211   Basic Metabolic Panel: Recent Labs  Lab 03/27/22 0429 03/28/22 0426 04/01/22 1900 04/02/22 0414  NA 141 139 143 144  K 3.7 4.1 3.9 4.3  CL 110 111 114* 117*  CO2 25 25 20* 21*  GLUCOSE 79 107* 153* 173*  BUN 28* 32* 20 23*   CREATININE 2.45* 2.13* 1.95* 2.28*  CALCIUM 8.1* 8.1* 8.8* 8.2*   GFR: Estimated Creatinine Clearance: 33.7 mL/min (A) (by C-G formula based on SCr of 2.28 mg/dL (H)). Liver Function Tests: Recent Labs  Lab 04/01/22 1900  AST 15  ALT 13  ALKPHOS 69  BILITOT 0.8  PROT 6.4*  ALBUMIN 3.0*   No results for input(s): "LIPASE", "AMYLASE" in the last 168 hours. No results for input(s): "AMMONIA" in the last 168 hours. Coagulation Profile: Recent Labs  Lab 04/01/22 1900  INR 0.9   Cardiac Enzymes: No results for input(s): "CKTOTAL", "CKMB", "CKMBINDEX", "TROPONINI" in the last 168 hours. BNP (last 3 results) No results for input(s): "PROBNP" in the last 8760 hours. HbA1C: No results for input(s): "HGBA1C" in the last 72 hours. CBG: Recent Labs  Lab 04/01/22 1655 04/01/22 1904 04/01/22 2332 04/02/22 0423 04/02/22 0823  GLUCAP 101* 155* 196* 172* 167*   Lipid Profile: No results for input(s): "CHOL", "HDL", "LDLCALC", "TRIG", "CHOLHDL", "LDLDIRECT" in the last 72 hours. Thyroid Function Tests: No results for input(s): "TSH", "T4TOTAL", "FREET4", "T3FREE", "THYROIDAB" in the last 72 hours. Anemia Panel: No results for input(s): "VITAMINB12", "FOLATE", "FERRITIN", "TIBC", "IRON", "RETICCTPCT" in the last 72 hours. Sepsis Labs: No results for input(s): "PROCALCITON", "LATICACIDVEN" in the last 168 hours.  Recent Results (from the past 240 hour(s))  Urine Culture     Status: Abnormal   Collection Time: 03/27/22 10:55 AM   Specimen: Urine, Clean Catch  Result Value Ref Range Status   Specimen Description   Final    URINE, CLEAN CATCH Performed at [facility], [place] [address], [place], [place] [address]    Special Requests   Final    NONE Performed at [facility], [place] [address], [place],  [address]    Culture >=100,000 COLONIES/mL ESCHERICHIA COLI (A)  Final   Report Status 03/29/2022 FINAL  Final   Organism ID, Bacteria  ESCHERICHIA COLI (A)  Final      Susceptibility   Escherichia coli - MIC*    AMPICILLIN >=32 RESISTANT Resistant     CEFAZOLIN <=4 SENSITIVE Sensitive     CEFEPIME <=0.12 SENSITIVE Sensitive     CEFTRIAXONE <=0.25 SENSITIVE Sensitive     CIPROFLOXACIN <=0.25 SENSITIVE Sensitive     GENTAMICIN <=1 SENSITIVE Sensitive     IMIPENEM <=0.25 SENSITIVE Sensitive     NITROFURANTOIN <=16 SENSITIVE Sensitive     TRIMETH/SULFA >=320 RESISTANT Resistant     AMPICILLIN/SULBACTAM 16 INTERMEDIATE Intermediate     PIP/TAZO <=4 SENSITIVE Sensitive     * >=100,000 COLONIES/mL ESCHERICHIA COLI         Radiology Studies: No results found.      Scheduled Meds:  insulin aspart  0-15 Units Subcutaneous Q4H   insulin detemir  25 Units Subcutaneous Daily   metoCLOPramide (REGLAN) injection  10 mg Intravenous Q8H   sodium chloride flush  3 mL Intravenous Q12H   Continuous Infusions:  sodium chloride 125 mL/hr at 04/02/22 0659   pantoprazole 8 mg/hr (04/02/22 0656)   promethazine (PHENERGAN) injection (IM or IVPB)  Stopped (04/02/22 0406)          Aline August, MD [facility] 04/02/2022, 11:27 AM

## 2022-04-02 NOTE — Anesthesia Preprocedure Evaluation (Signed)
Anesthesia Evaluation  Patient identified by MRN, date of birth, ID band Patient awake    Reviewed: Allergy & Precautions, NPO status , Patient's Chart, lab work & pertinent test results, reviewed documented beta blocker date and time   Airway Mallampati: II  TM Distance: >3 FB Neck ROM: Full    Dental  (+) Teeth Intact, Dental Advisory Given   Pulmonary asthma ,    Pulmonary exam normal breath sounds clear to auscultation       Cardiovascular hypertension, Pt. on home beta blockers +CHF  Normal cardiovascular exam Rhythm:Regular Rate:Normal     Neuro/Psych  Headaches, PSYCHIATRIC DISORDERS Anxiety Depression  Neuromuscular disease    GI/Hepatic Neg liver ROS, Nausea/vomiting    Endo/Other  diabetes, Poorly Controlled, Type 1, Insulin Dependent  Renal/GU Renal InsufficiencyRenal disease     Musculoskeletal  (+) Arthritis ,   Abdominal Normal abdominal exam  (+)   Peds  Hematology  (+) Blood dyscrasia, anemia ,   Anesthesia Other Findings Day of surgery medications reviewed with the patient.  Reproductive/Obstetrics                             Anesthesia Physical  Anesthesia Plan  ASA: III  Anesthesia Plan: MAC   Post-op Pain Management:    Induction:   PONV Risk Score and Plan: Propofol infusion and TIVA  Airway Management Planned: Natural Airway and Mask  Additional Equipment: None  Intra-op Plan:   Post-operative Plan:   Informed Consent: I have reviewed the patients History and Physical, chart, labs and discussed the procedure including the risks, benefits and alternatives for the proposed anesthesia with the patient or authorized representative who has indicated his/her understanding and acceptance.     Dental advisory given  Plan Discussed with: CRNA  Anesthesia Plan Comments:         Anesthesia Quick Evaluation

## 2022-04-02 NOTE — ED Notes (Signed)
Pt complaining of 9/10 back pain not relieved by Tylenol

## 2022-04-02 NOTE — Transfer of Care (Signed)
Immediate Anesthesia Transfer of Care Note  Patient: Shruti Arrey  Procedure(s) Performed: ESOPHAGOGASTRODUODENOSCOPY (EGD) WITH PROPOFOL BIOPSY  Patient Location: PACU  Anesthesia Type:MAC  Level of Consciousness: awake  Airway & Oxygen Therapy: Patient Spontanous Breathing  Post-op Assessment: Report given to RN  Post vital signs: stable  Last Vitals:  Vitals Value Taken Time  BP 136/69 04/02/22 1449  Temp 36.2 C 04/02/22 1449  Pulse 82 04/02/22 1449  Resp 28 04/02/22 1449  SpO2 97 % 04/02/22 1449    Last Pain:  Vitals:   04/02/22 1449  TempSrc: Temporal  PainSc: 0-No pain         Complications: No notable events documented.

## 2022-04-02 NOTE — ED Notes (Signed)
Went in room to perform assessment after receiving report from day shift Nurse and Protonix and IV fluid not connect. Re-connected both Protonix and NS.

## 2022-04-02 NOTE — Op Note (Signed)
[facility] Patient Name: [name] Procedure Date: 04/02/2022 MRN: [id number] Attending MD: Jackquline Denmark , MD Date of Birth: 10-05-1970 CSN: [id number] Age: 51 Admit Type: Inpatient Procedure:                Upper GI endoscopy Indications:              Hematemesis Providers:                Jackquline Denmark, MD, Dulcy Fanny, Gloris Ham, Technician Referring MD:              Medicines:                Monitored Anesthesia Care Complications:            No immediate complications. Estimated Blood Loss:     Estimated blood loss: none. Estimated blood loss:                            none. Procedure:                Pre-Anesthesia Assessment:                           - Prior to the procedure, a History and Physical                            was performed, and patient medications and                            allergies were reviewed. The patient's tolerance of                            previous anesthesia was also reviewed. The risks                            and benefits of the procedure and the sedation                            options and risks were discussed with the patient.                            All questions were answered, and informed consent                            was obtained. Prior Anticoagulants: The patient has                            taken no previous anticoagulant or antiplatelet                            agents. ASA Grade Assessment: III - A patient with                            severe systemic disease. After reviewing the  risks                            and benefits, the patient was deemed in                            satisfactory condition to undergo the procedure.                           After obtaining informed consent, the endoscope was                            passed under direct vision. Throughout the                            procedure, the patient's blood pressure, pulse, and                             oxygen saturations were monitored continuously. The                            [id number]-H190 ([id number]) Olympus endoscope was introduced                            through the mouth, and advanced to the second part                            of duodenum. The upper GI endoscopy was                            accomplished without difficulty. The patient                            tolerated the procedure well. Scope In: Scope Out: Findings:      LA Grade A (one or more mucosal breaks less than 5 mm, not extending       between tops of 2 mucosal folds) esophagitis with no bleeding was found       40 cm from the incisors. Biopsies were taken with a cold forceps for       histology.      The entire examined stomach was normal.      A few (4-5) 2 to 4 mm sessile polyps with no bleeding and no stigmata of       recent bleeding were found in the gastric body. 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 Biopsies for histology were taken with       a cold forceps for evaluation of celiac disease. Impression:               - LA Grade A reflux esophagitis with no bleeding.                            Biopsied.                           -  A few gastric polyps.                           - No active bleeding. Moderate Sedation:      Not Applicable - Patient had care per Anesthesia. Recommendation:           - Patient has a contact number available for                            emergencies. The signs and symptoms of potential                            delayed complications were discussed with the                            patient. Return to normal activities tomorrow.                            Written discharge instructions were provided to the                            patient.                           - Full liquids. Advance diet as tolerated.                           - Continue Protonix 40 mg p.o. daily                           -  If still with heartburn, low-dose Carafate 1g PO                            BID (d/t CKD) x 1 week                           - Continue present medications.                           - Avoid nonsteroidals.                           - Await pathology results.                           - FU GI as outpt with Dr. Natividad [facility] for                            further work-up. Would recommend solid-phase GES                            and colonoscopy as outpatient.                           - Will sign off for now. Pl call if any problems/?.                           -  The findings and recommendations were discussed                            with the patient. Procedure Code(s):        --- Professional ---                           [phone], Esophagogastroduodenoscopy, flexible,                            transoral; with biopsy, single or multiple Diagnosis Code(s):        --- Professional ---                           K21.00, Gastro-esophageal reflux disease with                            esophagitis, without bleeding                           K31.7, Polyp of stomach and duodenum                           K92.0, Hematemesis CPT copyright 2019 [employer]. All rights reserved. The codes documented in this report are preliminary and upon coder review may  be revised to meet current compliance requirements. Jackquline Denmark, MD 04/02/2022 2:51:42 PM This report has been signed electronically. Number of Addenda: 0

## 2022-04-03 DIAGNOSIS — K209 Esophagitis, unspecified without bleeding: Secondary | ICD-10-CM

## 2022-04-03 DIAGNOSIS — I5032 Chronic diastolic (congestive) heart failure: Secondary | ICD-10-CM | POA: Diagnosis not present

## 2022-04-03 DIAGNOSIS — K920 Hematemesis: Secondary | ICD-10-CM | POA: Diagnosis not present

## 2022-04-03 LAB — BASIC METABOLIC PANEL
Anion gap: 3 — ABNORMAL LOW (ref 5–15)
BUN: 22 mg/dL — ABNORMAL HIGH (ref 6–20)
CO2: 21 mmol/L — ABNORMAL LOW (ref 22–32)
Calcium: 7.8 mg/dL — ABNORMAL LOW (ref 8.9–10.3)
Chloride: 118 mmol/L — ABNORMAL HIGH (ref 98–111)
Creatinine, Ser: 2.34 mg/dL — ABNORMAL HIGH (ref 0.44–1.00)
GFR, Estimated: 25 mL/min — ABNORMAL LOW (ref >60)
Glucose, Bld: 135 mg/dL — ABNORMAL HIGH (ref 70–99)
Potassium: 3.8 mmol/L (ref 3.5–5.1)
Sodium: 142 mmol/L (ref 135–145)

## 2022-04-03 LAB — CBC WITH DIFFERENTIAL/PLATELET
Abs Immature Granulocytes: 0.01 10*3/uL (ref 0.00–0.07)
Basophils Absolute: 0 10*3/uL (ref 0.0–0.1)
Basophils Relative: 0 %
Eosinophils Absolute: 0.1 10*3/uL (ref 0.0–0.5)
Eosinophils Relative: 1 %
HCT: 29.6 % — ABNORMAL LOW (ref 36.0–46.0)
Hemoglobin: 9.5 g/dL — ABNORMAL LOW (ref 12.0–15.0)
Immature Granulocytes: 0 %
Lymphocytes Relative: 40 %
Lymphs Abs: 2.7 10*3/uL (ref 0.7–4.0)
MCH: 28.6 pg (ref 26.0–34.0)
MCHC: 32.1 g/dL (ref 30.0–36.0)
MCV: 89.2 fL (ref 80.0–100.0)
Monocytes Absolute: 0.3 10*3/uL (ref 0.1–1.0)
Monocytes Relative: 5 %
Neutro Abs: 3.6 10*3/uL (ref 1.7–7.7)
Neutrophils Relative %: 54 %
Platelets: 236 10*3/uL (ref 150–400)
RBC: 3.32 MIL/uL — ABNORMAL LOW (ref 3.87–5.11)
RDW: 14.6 % (ref 11.5–15.5)
WBC: 6.7 10*3/uL (ref 4.0–10.5)
nRBC: 0 % (ref 0.0–0.2)

## 2022-04-03 LAB — CBG MONITORING, ED
Glucose-Capillary: 134 mg/dL — ABNORMAL HIGH (ref 70–99)
Glucose-Capillary: 63 mg/dL — ABNORMAL LOW (ref 70–99)
Glucose-Capillary: 146 mg/dL — ABNORMAL HIGH (ref 70–99)

## 2022-04-03 LAB — GLUCOSE, CAPILLARY: Glucose-Capillary: 144 mg/dL — ABNORMAL HIGH (ref 70–99)

## 2022-04-03 LAB — MAGNESIUM: Magnesium: 1.9 mg/dL (ref 1.7–2.4)

## 2022-04-03 LAB — SURGICAL PATHOLOGY

## 2022-04-03 MED ORDER — INSULIN ASPART 100 UNIT/ML IJ SOLN
0.0000 [IU] | Freq: Three times a day (TID) | INTRAMUSCULAR | Status: DC
  Administered 2022-04-03: 2 [IU] via SUBCUTANEOUS
  Filled 2022-04-03: qty 0.15

## 2022-04-03 MED ORDER — CARVEDILOL 12.5 MG PO TABS
12.5000 mg | ORAL_TABLET | Freq: Two times a day (BID) | ORAL | Status: DC
  Administered 2022-04-03: 12.5 mg via ORAL
  Filled 2022-04-03: qty 1

## 2022-04-03 MED ORDER — PANTOPRAZOLE SODIUM 40 MG PO TBEC: 40.0000 mg | DELAYED_RELEASE_TABLET | Freq: Every day | ORAL | 0 refills | Status: DC

## 2022-04-03 MED ORDER — ORAL CARE MOUTH RINSE: 15.0000 mL | OROMUCOSAL | Status: DC | PRN

## 2022-04-03 MED ORDER — PANTOPRAZOLE SODIUM 40 MG PO TBEC
40.0000 mg | DELAYED_RELEASE_TABLET | Freq: Every day | ORAL | Status: DC
  Administered 2022-04-03: 40 mg via ORAL
  Filled 2022-04-03: qty 1

## 2022-04-03 MED ORDER — PROMETHAZINE HCL 12.5 MG PO TABS: 12.5000 mg | ORAL_TABLET | Freq: Four times a day (QID) | ORAL | 0 refills | Status: DC | PRN

## 2022-04-03 NOTE — Discharge Summary (Signed)
Physician Discharge Summary  [name] XLK:[id number] DOB: [birth year] DOA: 04/01/2022  PCP: Megan Roles, PA  Admit date: 04/01/2022 Discharge date: 04/03/2022 Discharging to: Home Recommendations for Outpatient Follow-up:  Please follow BUN/creatinine  Consults:  GI Procedures:  EGD   Discharge Diagnoses:   Principal Problem:   Hematemesis Active Problems:   Esophagitis   Diabetic gastroparesis (HCC)   Mood disorder ([place])   Chronic diastolic CHF (congestive heart failure) (HCC)   Stage 3b chronic kidney disease (CKD) ([place])   GI bleed     Hospital Course:  This is a 51 year old female with diabetes mellitus with gastroparesis, CKD 3B, diastolic heart failure who presented to the hospital for hematemesis nausea vomiting and abdominal pain.  She states that she has been seen as outpatient by GI. In the ED she was noted to be second currently hypertensive with a systolic pressure in the 366Y to 200s.  Hemoglobin was noted to be 10.7. She was evaluated by GI and on 8/29 she underwent an EGD.  Principal Problem:   Hematemesis-esophagitis - No further hematemesis since yesterday - EGD reveals LA grade a esophagitis-she was not bleeding at the time of EGD GI has recommended Protonix 40 mg daily She is recommended to add low-dose Carafate 1 g twice daily times a week if she continues to have heartburn - Avoid NSAIDs - Follow-up pathology results at next visit with Dr. Loletha Carrow - GI suggest that she may need a solid laden GES and will anoscopy as outpatient . Active Problems:      Chronic diastolic CHF (congestive heart failure) (HCC) -Continue outpatient medications    Stage 3b chronic kidney disease (CKD) (HCC) -interestingly, creatinine was 1.4-1.7 in the spring of this year but is now about 2.3-question if this is secondary to Lasix - Continue outpatient follow-up         Discharge Instructions  Discharge Instructions     Diet - low sodium heart healthy    Complete by: As directed    Diet Carb Modified   Complete by: As directed    Increase activity slowly   Complete by: As directed       Allergies as of 04/03/2022       Reactions   Amoxicillin Itching   Atorvastatin    Dizziness    Dulaglutide Nausea And Vomiting   Lantus [insulin Glargine] Itching, Rash   Miconazole Nitrate Rash        Medication List     TAKE these medications    acetaminophen 500 MG tablet Commonly known as: TYLENOL Take 500 mg by mouth every 6 (six) hours as needed for moderate pain.   albuterol 108 (90 Base) MCG/ACT inhaler Commonly known as: ProAir HFA Inhale 2 puffs into the lungs every 6 (six) hours as needed for wheezing or shortness of breath.   carvedilol 12.5 MG tablet Commonly known as: COREG Take 1 tablet (12.5 mg total) by mouth 2 (two) times daily.   feeding supplement (GLUCERNA SHAKE) Liqd Take 237 mLs by mouth 2 (two) times daily between meals. What changed:  when to take this reasons to take this   FreeStyle Libre 2 Sensor Misc CHANGE EVERY 14 DAYS   hydrOXYzine 50 MG tablet Commonly known as: ATARAX Take 50 mg by mouth in the morning, at noon, and at bedtime.   LUBRICATING EYE DROPS OP Place 1 drop into both eyes daily as needed (dry eyes).   NovoLOG 100 UNIT/ML injection Generic drug: insulin aspart Inject 0-120 Units into  the skin daily.   OmniPod 10 Pack Misc by Does not apply route.   pantoprazole 40 MG tablet Commonly known as: Protonix Take 1 tablet (40 mg total) by mouth daily.   polyethylene glycol 17 g packet Commonly known as: MIRALAX / GLYCOLAX Take 17 g by mouth daily as needed for moderate constipation.   pregabalin 50 MG capsule Commonly known as: LYRICA Take 50 mg by mouth 2 (two) times daily as needed (neuropathy).   promethazine 12.5 MG tablet Commonly known as: PHENERGAN Take 1 tablet (12.5 mg total) by mouth every 6 (six) hours as needed for nausea or vomiting.   tobramycin 0.3 %  ophthalmic solution Commonly known as: TOBREX Place 1 drop into both eyes See admin instructions. Instill 1-2 drop into both eyes 3 times daily the day before, of, and the day after monthly eye injections   torsemide 20 MG tablet Commonly known as: DEMADEX Take 20 mg by mouth daily as needed (swelling).   [name] 200 UNIT/ML FlexTouch Pen Generic drug: insulin degludec Inject 50 Units into the skin daily as needed (when insulin pump stops working).            The results of significant diagnostics from this hospitalization (including imaging, microbiology, ancillary and laboratory) are listed below for reference.    US RENAL  Result Date: 03/27/2022 CLINICAL DATA:  AKI EXAM: RENAL / URINARY TRACT ULTRASOUND COMPLETE COMPARISON:  Abdominal ultrasound 05/01/2021, CT abdomen/pelvis 08/24/2020 and 11/21/2018 FINDINGS: Right Kidney: Renal measurements: 12.4 cm x 4.6 cm x 5.5 cm = volume: 165 mL. Echogenicity within normal limits. No mass or hydronephrosis visualized. Left Kidney: Renal measurements: 10.2 cm x 5.7 cm x 5.1 cm = volume: 160 mL. Echogenicity within normal limits. No mass or hydronephrosis visualized. Bladder: Appears normal for degree of bladder distention. Both ureteral jets were identified. Other: None. IMPRESSION: Normal renal ultrasound. Electronically Signed   By: Valetta Mole M.D.   On: 03/27/2022 10:32   ECHOCARDIOGRAM COMPLETE  Result Date: 03/26/2022    ECHOCARDIOGRAM REPORT   Patient Name:   [name] Date of Exam: 03/26/2022 Medical Rec #:  [id number]      Height:       72.0 in Accession #:    [id number]     Weight:       170.0 lb Date of Birth:  Apr 25, 1971     BSA:          1.988 m Patient Age:    52 years       BP:           109/68 mmHg Patient Gender: F              HR:           78 bpm. Exam Location:  Inpatient Procedure: 2D Echo, Cardiac Doppler, Color Doppler and Strain Analysis Indications:    CHF-Acute Systolic Z60.10  History:        Patient has  prior history of Echocardiogram examinations, most                 recent 08/25/2020. CHF; Risk Factors:Diabetes. Chronic kidney                 disease. Anemia.  Sonographer:    Darlina Sicilian RDCS Referring Phys: [place]  1. Left ventricular ejection fraction, by estimation, is 50 to 55%. The left ventricle has low normal function. The left ventricle has no regional wall motion abnormalities. The left  ventricular internal cavity size was mildly dilated. There is mild left ventricular hypertrophy. Left ventricular diastolic parameters are consistent with Grade II diastolic dysfunction (pseudonormalization). The average left ventricular global longitudinal strain is -12.0 %. The global longitudinal strain is abnormal.  2. Right ventricular systolic function is normal. The right ventricular size is normal. There is normal pulmonary artery systolic pressure.  3. The mitral valve is normal in structure. No evidence of mitral valve regurgitation.  4. The aortic valve is tricuspid. Aortic valve regurgitation is not visualized.  5. The inferior vena cava is normal in size with greater than 50% respiratory variability, suggesting right atrial pressure of 3 mmHg. Conclusion(s)/Recommendation(s): Compared to prior study, PASP has improved. Otherwise no significant changes. FINDINGS  Left Ventricle: Left ventricular ejection fraction, by estimation, is 50 to 55%. The left ventricle has low normal function. The left ventricle has no regional wall motion abnormalities. The average left ventricular global longitudinal strain is -12.0 %. The global longitudinal strain is abnormal. The left ventricular internal cavity size was mildly dilated. There is mild left ventricular hypertrophy. Left ventricular diastolic parameters are consistent with Grade II diastolic dysfunction (pseudonormalization). Right Ventricle: The right ventricular size is normal. Right ventricular systolic function is normal. There is  normal pulmonary artery systolic pressure. The tricuspid regurgitant velocity is 2.00 m/s, and with an assumed right atrial pressure of 3 mmHg,  the estimated right ventricular systolic pressure is 62.0 mmHg. Left Atrium: Left atrial size was normal in size. Right Atrium: Right atrial size was normal in size. Pericardium: There is no evidence of pericardial effusion. Mitral Valve: The mitral valve is normal in structure. There is mild thickening of the mitral valve leaflet(s). There is mild calcification of the mitral valve leaflet(s). No evidence of mitral valve regurgitation. Tricuspid Valve: The tricuspid valve is normal in structure. Tricuspid valve regurgitation is trivial. Aortic Valve: The aortic valve is tricuspid. Aortic valve regurgitation is not visualized. Pulmonic Valve: Pulmonic valve regurgitation is not visualized. Aorta: The aortic root and ascending aorta are structurally normal, with no evidence of dilitation. Venous: The inferior vena cava is normal in size with greater than 50% respiratory variability, suggesting right atrial pressure of 3 mmHg.  LEFT VENTRICLE PLAX 2D LVIDd:         5.00 cm      Diastology LVIDs:         3.60 cm      LV e' medial:    6.13 cm/s LV PW:         1.10 cm      LV E/e' medial:  13.0 LV IVS:        0.90 cm      LV e' lateral:   8.41 cm/s LVOT diam:     2.00 cm      LV E/e' lateral: 9.5 LV SV:         48 LV SV Index:   24           2D Longitudinal Strain LVOT Area:     3.14 cm     2D Strain GLS Avg:     -12.0 %  LV Volumes (MOD) LV vol d, MOD A2C: 133.0 ml LV vol d, MOD A4C: 145.0 ml LV vol s, MOD A2C: 71.1 ml LV vol s, MOD A4C: 52.7 ml LV SV MOD A2C:     61.9 ml LV SV MOD A4C:     145.0 ml LV SV MOD BP:      76.0  ml RIGHT VENTRICLE RV S prime:     12.00 cm/s TAPSE (M-mode): 1.8 cm LEFT ATRIUM             Index        RIGHT ATRIUM           Index LA diam:        4.30 cm 2.16 cm/m   RA Area:     13.70 cm LA Vol (A2C):   65.8 ml 33.09 ml/m  RA Volume:   31.90 ml   16.04 ml/m LA Vol (A4C):   69.4 ml 34.90 ml/m LA Biplane Vol: 68.1 ml 34.25 ml/m  AORTIC VALVE LVOT Vmax:   72.80 cm/s LVOT Vmean:  55.250 cm/s LVOT VTI:    0.152 m  AORTA Ao Root diam: 3.00 cm Ao Asc diam:  3.10 cm MITRAL VALVE               TRICUSPID VALVE MV Area (PHT): 3.46 cm    TR Peak grad:   16.0 mmHg MV Decel Time: 219 msec    TR Vmax:        200.00 cm/s MV E velocity: 79.70 cm/s MV A velocity: 43.30 cm/s  SHUNTS MV E/A ratio:  1.84        Systemic VTI:  0.15 m                            Systemic Diam: 2.00 cm Phineas Inches Electronically signed by Phineas Inches Signature Date/Time: 03/26/2022/3:17:41 PM    Final    DG Chest Port 1 View  Result Date: 03/25/2022 CLINICAL DATA:  Vomiting. EXAM: PORTABLE CHEST 1 VIEW COMPARISON:  08/25/2020 FINDINGS: The cardio pericardial silhouette is enlarged. There is pulmonary vascular congestion without overt pulmonary edema. Probable atelectasis left base with likely tiny bilateral pleural effusions. Telemetry leads overlie the chest. IMPRESSION: 1. Enlargement of the cardiopericardial silhouette with pulmonary vascular congestion. 2. Probable tiny bilateral pleural effusions. Electronically Signed   By: Misty Stanley M.D.   On: 03/25/2022 10:18   Labs:   Basic Metabolic Panel: Recent Labs  Lab 03/28/22 0426 04/01/22 1900 04/02/22 0414 04/03/22 0411  NA 139 143 144 142  K 4.1 3.9 4.3 3.8  CL 111 114* 117* 118*  CO2 25 20* 21* 21*  GLUCOSE 107* 153* 173* 135*  BUN 32* 20 23* 22*  CREATININE 2.13* 1.95* 2.28* 2.34*  CALCIUM 8.1* 8.8* 8.2* 7.8*  MG  --   --   --  1.9     CBC: Recent Labs  Lab 04/01/22 1900 04/01/22 2231 04/02/22 0431 04/02/22 1154 04/03/22 0411  WBC 7.0 7.9 7.6 6.8 6.7  NEUTROABS 5.9  --   --   --  3.6  HGB 10.7* 9.5* 9.6* 9.2* 9.5*  HCT 32.9* 29.9* 31.6* 28.4* 29.6*  MCV 86.4 87.9 92.1 87.7 89.2  PLT 294 245 238 266 236         SIGNED:   Debbe Odea, MD  [facility] 04/03/2022, 2:04 PM

## 2022-04-03 NOTE — Progress Notes (Signed)
Pt discharged home today per Dr. Wynelle Cleveland. Pt's IV site D/C'd and WDL. Pt's VSS. Pt provided with home medication list, discharge instructions and prescriptions. Verbalized understanding. Pt currently awaiting her transportation to home.[name]

## 2022-04-03 NOTE — ED Notes (Signed)
Pt reporting no relief from tylonol

## 2022-04-03 NOTE — Plan of Care (Signed)

## 2022-04-04 ENCOUNTER — Encounter (HOSPITAL_COMMUNITY): Payer: Self-pay | Admitting: Gastroenterology

## 2022-04-04 DIAGNOSIS — K920 Hematemesis: Secondary | ICD-10-CM | POA: Diagnosis not present

## 2022-04-04 DIAGNOSIS — K3184 Gastroparesis: Secondary | ICD-10-CM | POA: Diagnosis not present

## 2022-04-04 DIAGNOSIS — N1831 Chronic kidney disease, stage 3a: Secondary | ICD-10-CM | POA: Diagnosis not present

## 2022-04-04 DIAGNOSIS — B379 Candidiasis, unspecified: Secondary | ICD-10-CM | POA: Diagnosis not present

## 2022-04-05 ENCOUNTER — Inpatient Hospital Stay (HOSPITAL_COMMUNITY): Payer: BC Managed Care – PPO

## 2022-04-05 ENCOUNTER — Inpatient Hospital Stay (HOSPITAL_COMMUNITY)
Admission: EM | Admit: 2022-04-05 | Discharge: 2022-04-11 | DRG: 227 | Disposition: A | Discharge status: Home / Self Care | Payer: BC Managed Care – PPO | Attending: Family Medicine | Admitting: Family Medicine

## 2022-04-05 ENCOUNTER — Encounter (HOSPITAL_COMMUNITY): Payer: Self-pay

## 2022-04-05 ENCOUNTER — Emergency Department (HOSPITAL_COMMUNITY): Payer: BC Managed Care – PPO

## 2022-04-05 DIAGNOSIS — E1122 Type 2 diabetes mellitus with diabetic chronic kidney disease: Secondary | ICD-10-CM | POA: Diagnosis present

## 2022-04-05 DIAGNOSIS — I472 Ventricular tachycardia, unspecified: Secondary | ICD-10-CM | POA: Diagnosis present

## 2022-04-05 DIAGNOSIS — D509 Iron deficiency anemia, unspecified: Secondary | ICD-10-CM | POA: Diagnosis present

## 2022-04-05 DIAGNOSIS — I462 Cardiac arrest due to underlying cardiac condition: Secondary | ICD-10-CM | POA: Diagnosis present

## 2022-04-05 DIAGNOSIS — K3184 Gastroparesis: Secondary | ICD-10-CM | POA: Diagnosis not present

## 2022-04-05 DIAGNOSIS — E1165 Type 2 diabetes mellitus with hyperglycemia: Secondary | ICD-10-CM | POA: Diagnosis not present

## 2022-04-05 DIAGNOSIS — Z8349 Family history of other endocrine, nutritional and metabolic diseases: Secondary | ICD-10-CM | POA: Diagnosis not present

## 2022-04-05 DIAGNOSIS — J45909 Unspecified asthma, uncomplicated: Secondary | ICD-10-CM | POA: Diagnosis present

## 2022-04-05 DIAGNOSIS — I4901 Ventricular fibrillation: Principal | ICD-10-CM | POA: Diagnosis present

## 2022-04-05 DIAGNOSIS — N184 Chronic kidney disease, stage 4 (severe): Secondary | ICD-10-CM | POA: Diagnosis present

## 2022-04-05 DIAGNOSIS — F419 Anxiety disorder, unspecified: Secondary | ICD-10-CM | POA: Diagnosis present

## 2022-04-05 DIAGNOSIS — I5032 Chronic diastolic (congestive) heart failure: Secondary | ICD-10-CM | POA: Diagnosis present

## 2022-04-05 DIAGNOSIS — I130 Hypertensive heart and chronic kidney disease with heart failure and stage 1 through stage 4 chronic kidney disease, or unspecified chronic kidney disease: Secondary | ICD-10-CM | POA: Diagnosis present

## 2022-04-05 DIAGNOSIS — I428 Other cardiomyopathies: Secondary | ICD-10-CM | POA: Diagnosis present

## 2022-04-05 DIAGNOSIS — N179 Acute kidney failure, unspecified: Secondary | ICD-10-CM | POA: Diagnosis present

## 2022-04-05 DIAGNOSIS — N1832 Chronic kidney disease, stage 3b: Secondary | ICD-10-CM | POA: Diagnosis present

## 2022-04-05 DIAGNOSIS — Z8249 Family history of ischemic heart disease and other diseases of the circulatory system: Secondary | ICD-10-CM | POA: Diagnosis not present

## 2022-04-05 DIAGNOSIS — I469 Cardiac arrest, cause unspecified: Secondary | ICD-10-CM | POA: Diagnosis present

## 2022-04-05 DIAGNOSIS — K2100 Gastro-esophageal reflux disease with esophagitis, without bleeding: Secondary | ICD-10-CM | POA: Diagnosis present

## 2022-04-05 DIAGNOSIS — R55 Syncope and collapse: Secondary | ICD-10-CM | POA: Diagnosis not present

## 2022-04-05 DIAGNOSIS — Z20822 Contact with and (suspected) exposure to covid-19: Secondary | ICD-10-CM | POA: Diagnosis present

## 2022-04-05 DIAGNOSIS — E872 Acidosis, unspecified: Secondary | ICD-10-CM | POA: Diagnosis not present

## 2022-04-05 DIAGNOSIS — Z823 Family history of stroke: Secondary | ICD-10-CM

## 2022-04-05 DIAGNOSIS — Z794 Long term (current) use of insulin: Secondary | ICD-10-CM

## 2022-04-05 DIAGNOSIS — E876 Hypokalemia: Secondary | ICD-10-CM | POA: Diagnosis present

## 2022-04-05 DIAGNOSIS — I10 Essential (primary) hypertension: Secondary | ICD-10-CM | POA: Diagnosis present

## 2022-04-05 DIAGNOSIS — R112 Nausea with vomiting, unspecified: Secondary | ICD-10-CM | POA: Diagnosis not present

## 2022-04-05 DIAGNOSIS — E1040 Type 1 diabetes mellitus with diabetic neuropathy, unspecified: Secondary | ICD-10-CM | POA: Diagnosis not present

## 2022-04-05 DIAGNOSIS — Z888 Allergy status to other drugs, medicaments and biological substances status: Secondary | ICD-10-CM

## 2022-04-05 DIAGNOSIS — I5042 Chronic combined systolic (congestive) and diastolic (congestive) heart failure: Secondary | ICD-10-CM | POA: Diagnosis present

## 2022-04-05 DIAGNOSIS — Z79899 Other long term (current) drug therapy: Secondary | ICD-10-CM

## 2022-04-05 DIAGNOSIS — E1069 Type 1 diabetes mellitus with other specified complication: Secondary | ICD-10-CM | POA: Diagnosis present

## 2022-04-05 DIAGNOSIS — E1140 Type 2 diabetes mellitus with diabetic neuropathy, unspecified: Secondary | ICD-10-CM | POA: Diagnosis present

## 2022-04-05 DIAGNOSIS — E1143 Type 2 diabetes mellitus with diabetic autonomic (poly)neuropathy: Secondary | ICD-10-CM | POA: Diagnosis present

## 2022-04-05 DIAGNOSIS — Z833 Family history of diabetes mellitus: Secondary | ICD-10-CM | POA: Diagnosis not present

## 2022-04-05 DIAGNOSIS — F32A Depression, unspecified: Secondary | ICD-10-CM | POA: Diagnosis present

## 2022-04-05 DIAGNOSIS — Z9641 Presence of insulin pump (external) (internal): Secondary | ICD-10-CM | POA: Diagnosis present

## 2022-04-05 DIAGNOSIS — Z818 Family history of other mental and behavioral disorders: Secondary | ICD-10-CM

## 2022-04-05 LAB — CBC WITH DIFFERENTIAL/PLATELET
Abs Immature Granulocytes: 0.02 10*3/uL (ref 0.00–0.07)
Basophils Absolute: 0 10*3/uL (ref 0.0–0.1)
Basophils Relative: 0 %
Eosinophils Absolute: 0.1 10*3/uL (ref 0.0–0.5)
Eosinophils Relative: 2 %
HCT: 33.5 % — ABNORMAL LOW (ref 36.0–46.0)
Hemoglobin: 10.6 g/dL — ABNORMAL LOW (ref 12.0–15.0)
Immature Granulocytes: 0 %
Lymphocytes Relative: 17 %
Lymphs Abs: 1.5 10*3/uL (ref 0.7–4.0)
MCH: 27.7 pg (ref 26.0–34.0)
MCHC: 31.6 g/dL (ref 30.0–36.0)
MCV: 87.5 fL (ref 80.0–100.0)
Monocytes Absolute: 0.4 10*3/uL (ref 0.1–1.0)
Monocytes Relative: 5 %
Neutro Abs: 6.7 10*3/uL (ref 1.7–7.7)
Neutrophils Relative %: 76 %
Platelets: 240 10*3/uL (ref 150–400)
RBC: 3.83 MIL/uL — ABNORMAL LOW (ref 3.87–5.11)
RDW: 14.3 % (ref 11.5–15.5)
WBC: 8.8 10*3/uL (ref 4.0–10.5)
nRBC: 0 % (ref 0.0–0.2)

## 2022-04-05 LAB — TROPONIN I (HIGH SENSITIVITY)
Troponin I (High Sensitivity): 38 ng/L — ABNORMAL HIGH (ref <18)
Troponin I (High Sensitivity): 78 ng/L — ABNORMAL HIGH (ref <18)
Troponin I (High Sensitivity): 155 ng/L (ref <18)
Troponin I (High Sensitivity): 139 ng/L (ref <18)

## 2022-04-05 LAB — GLUCOSE, CAPILLARY
Glucose-Capillary: 54 mg/dL — ABNORMAL LOW (ref 70–99)
Glucose-Capillary: 81 mg/dL (ref 70–99)
Glucose-Capillary: 119 mg/dL — ABNORMAL HIGH (ref 70–99)

## 2022-04-05 LAB — ECHOCARDIOGRAM LIMITED

## 2022-04-05 LAB — COMPREHENSIVE METABOLIC PANEL
ALT: 16 U/L (ref 0–44)
AST: 21 U/L (ref 15–41)
Albumin: 2.7 g/dL — ABNORMAL LOW (ref 3.5–5.0)
Alkaline Phosphatase: 73 U/L (ref 38–126)
Anion gap: 10 (ref 5–15)
BUN: 25 mg/dL — ABNORMAL HIGH (ref 6–20)
CO2: 21 mmol/L — ABNORMAL LOW (ref 22–32)
Calcium: 8.6 mg/dL — ABNORMAL LOW (ref 8.9–10.3)
Chloride: 109 mmol/L (ref 98–111)
Creatinine, Ser: 2.26 mg/dL — ABNORMAL HIGH (ref 0.44–1.00)
GFR, Estimated: 26 mL/min — ABNORMAL LOW (ref >60)
Glucose, Bld: 141 mg/dL — ABNORMAL HIGH (ref 70–99)
Potassium: 4.2 mmol/L (ref 3.5–5.1)
Sodium: 140 mmol/L (ref 135–145)
Total Bilirubin: 0.8 mg/dL (ref 0.3–1.2)
Total Protein: 6.2 g/dL — ABNORMAL LOW (ref 6.5–8.1)

## 2022-04-05 LAB — RESP PANEL BY RT-PCR (FLU A&B, COVID) ARPGX2
Influenza A by PCR: NEGATIVE
Influenza B by PCR: NEGATIVE
SARS Coronavirus 2 by RT PCR: NEGATIVE

## 2022-04-05 LAB — LIPASE, BLOOD: Lipase: 24 U/L (ref 11–51)

## 2022-04-05 LAB — MAGNESIUM: Magnesium: 1.7 mg/dL (ref 1.7–2.4)

## 2022-04-05 MED ORDER — SODIUM CHLORIDE 0.9% FLUSH
3.0000 mL | Freq: Two times a day (BID) | INTRAVENOUS | Status: DC
  Administered 2022-04-05 – 2022-04-11 (×11): 3 mL via INTRAVENOUS

## 2022-04-05 MED ORDER — PROMETHAZINE HCL 25 MG/ML IJ SOLN
25.0000 mg | Freq: Four times a day (QID) | INTRAMUSCULAR | Status: DC | PRN
  Administered 2022-04-05 – 2022-04-10 (×4): 25 mg via INTRAVENOUS
  Filled 2022-04-05 (×2): qty 25
  Filled 2022-04-05: qty 1
  Filled 2022-04-05: qty 25

## 2022-04-05 MED ORDER — INSULIN ASPART 100 UNIT/ML IJ SOLN
0.0000 [IU] | INTRAMUSCULAR | Status: DC
  Administered 2022-04-06: 5 [IU] via SUBCUTANEOUS
  Administered 2022-04-06: 3 [IU] via SUBCUTANEOUS
  Administered 2022-04-06: 2 [IU] via SUBCUTANEOUS
  Administered 2022-04-06: 3 [IU] via SUBCUTANEOUS
  Administered 2022-04-07: 2 [IU] via SUBCUTANEOUS
  Administered 2022-04-07: 3 [IU] via SUBCUTANEOUS
  Administered 2022-04-07 (×2): 2 [IU] via SUBCUTANEOUS
  Administered 2022-04-07: 3 [IU] via SUBCUTANEOUS
  Administered 2022-04-08 (×2): 2 [IU] via SUBCUTANEOUS

## 2022-04-05 MED ORDER — PREGABALIN 50 MG PO CAPS: 50.0000 mg | ORAL_CAPSULE | Freq: Two times a day (BID) | ORAL | Status: DC | PRN

## 2022-04-05 MED ORDER — BISACODYL 5 MG PO TBEC: 5.0000 mg | DELAYED_RELEASE_TABLET | Freq: Every day | ORAL | Status: DC | PRN

## 2022-04-05 MED ORDER — DOCUSATE SODIUM 100 MG PO CAPS
100.0000 mg | ORAL_CAPSULE | Freq: Two times a day (BID) | ORAL | Status: DC
  Administered 2022-04-08 – 2022-04-11 (×6): 100 mg via ORAL
  Filled 2022-04-05 (×11): qty 1

## 2022-04-05 MED ORDER — ONDANSETRON HCL 4 MG/2ML IJ SOLN
4.0000 mg | Freq: Four times a day (QID) | INTRAMUSCULAR | Status: DC | PRN
Start: 2022-04-05 — End: 2022-04-05
  Filled 2022-04-05: qty 2

## 2022-04-05 MED ORDER — FUROSEMIDE 10 MG/ML IJ SOLN
40.0000 mg | Freq: Once | INTRAMUSCULAR | Status: AC
  Administered 2022-04-05: 40 mg via INTRAVENOUS
  Filled 2022-04-05: qty 4

## 2022-04-05 MED ORDER — HYDROXYZINE HCL 25 MG PO TABS
50.0000 mg | ORAL_TABLET | Freq: Three times a day (TID) | ORAL | Status: DC
  Administered 2022-04-06 – 2022-04-11 (×14): 50 mg via ORAL
  Filled 2022-04-05 (×15): qty 2

## 2022-04-05 MED ORDER — POLYETHYLENE GLYCOL 3350 17 G PO PACK: 17.0000 g | PACK | Freq: Every day | ORAL | Status: DC | PRN

## 2022-04-05 MED ORDER — HYDRALAZINE HCL 20 MG/ML IJ SOLN
5.0000 mg | INTRAMUSCULAR | Status: DC | PRN
  Administered 2022-04-05: 5 mg via INTRAVENOUS
  Filled 2022-04-05: qty 1

## 2022-04-05 MED ORDER — ALBUTEROL SULFATE HFA 108 (90 BASE) MCG/ACT IN AERS: 2.0000 | INHALATION_SPRAY | Freq: Four times a day (QID) | RESPIRATORY_TRACT | Status: DC | PRN

## 2022-04-05 MED ORDER — ENOXAPARIN SODIUM 30 MG/0.3ML IJ SOSY
30.0000 mg | PREFILLED_SYRINGE | INTRAMUSCULAR | Status: DC
  Administered 2022-04-05 – 2022-04-08 (×4): 30 mg via SUBCUTANEOUS
  Filled 2022-04-05 (×4): qty 0.3

## 2022-04-05 MED ORDER — FENTANYL CITRATE PF 50 MCG/ML IJ SOSY
100.0000 ug | PREFILLED_SYRINGE | Freq: Once | INTRAMUSCULAR | Status: AC
  Administered 2022-04-05: 100 ug via INTRAVENOUS
  Filled 2022-04-05: qty 2

## 2022-04-05 MED ORDER — MORPHINE SULFATE (PF) 2 MG/ML IV SOLN
2.0000 mg | INTRAVENOUS | Status: DC | PRN
  Administered 2022-04-05 – 2022-04-07 (×5): 2 mg via INTRAVENOUS
  Filled 2022-04-05 (×5): qty 1

## 2022-04-05 MED ORDER — ALBUTEROL SULFATE (2.5 MG/3ML) 0.083% IN NEBU: 2.5000 mg | INHALATION_SOLUTION | Freq: Four times a day (QID) | RESPIRATORY_TRACT | Status: DC | PRN

## 2022-04-05 MED ORDER — INSULIN PUMP
Freq: Three times a day (TID) | SUBCUTANEOUS | Status: DC
  Filled 2022-04-05: qty 1

## 2022-04-05 MED ORDER — CARVEDILOL 12.5 MG PO TABS
12.5000 mg | ORAL_TABLET | Freq: Two times a day (BID) | ORAL | Status: DC
  Administered 2022-04-06 – 2022-04-11 (×11): 12.5 mg via ORAL
  Filled 2022-04-05 (×11): qty 1

## 2022-04-05 MED ORDER — TORSEMIDE 20 MG PO TABS
20.0000 mg | ORAL_TABLET | Freq: Every day | ORAL | Status: DC
  Administered 2022-04-06: 20 mg via ORAL
  Filled 2022-04-05: qty 1

## 2022-04-05 MED ORDER — LACTATED RINGERS IV SOLN: INTRAVENOUS | Status: DC

## 2022-04-05 MED ORDER — ACETAMINOPHEN 325 MG PO TABS
650.0000 mg | ORAL_TABLET | Freq: Four times a day (QID) | ORAL | Status: DC | PRN
Start: 2022-04-05 — End: 2022-04-09
  Administered 2022-04-06 – 2022-04-07 (×2): 650 mg via ORAL
  Filled 2022-04-05 (×2): qty 2

## 2022-04-05 MED ORDER — ACETAMINOPHEN 650 MG RE SUPP: 650.0000 mg | Freq: Four times a day (QID) | RECTAL | Status: DC | PRN

## 2022-04-05 MED ORDER — OXYCODONE HCL 5 MG PO TABS
5.0000 mg | ORAL_TABLET | ORAL | Status: DC | PRN
  Administered 2022-04-05 – 2022-04-09 (×5): 5 mg via ORAL
  Filled 2022-04-05 (×5): qty 1

## 2022-04-05 MED ORDER — PANTOPRAZOLE SODIUM 40 MG PO TBEC
40.0000 mg | DELAYED_RELEASE_TABLET | Freq: Every day | ORAL | Status: DC
  Administered 2022-04-05 – 2022-04-11 (×7): 40 mg via ORAL
  Filled 2022-04-05 (×7): qty 1

## 2022-04-05 MED ORDER — METOCLOPRAMIDE HCL 5 MG PO TABS
10.0000 mg | ORAL_TABLET | Freq: Three times a day (TID) | ORAL | Status: DC
  Administered 2022-04-05: 10 mg via ORAL
  Filled 2022-04-05: qty 2

## 2022-04-05 MED ORDER — ONDANSETRON HCL 4 MG PO TABS
4.0000 mg | ORAL_TABLET | Freq: Four times a day (QID) | ORAL | Status: DC | PRN
  Administered 2022-04-05: 4 mg via ORAL

## 2022-04-05 NOTE — H&P (Signed)
History and Physical    Patient: [name] TKK:[id number] DOB: 03/13/1971 DOA: 04/05/2022 DOS: the patient was seen and examined on 04/05/2022 PCP: [name], PA  Patient coming from: Home - lives with sons (17 and 11); NOK: Mikey Kirschner, [phone]    Chief Complaint: syncope  HPI: [name] is a 51 y.o. female with medical history significant of DM with gastroparesis, stage 3B CKD, and chronic diastolic CHF presenting with syncope vs. Cardiac arrest.  She was last admitted from 8/28-30 for hematemesis and EGD revealed grade A esophagitis with active bleeding at the time of scope.   Prior to that, she was admitted from 8/21-24 for AKI that was thought to be related to gastroparesis.  She was standing in line at [place].  She remembers what happened just prior, placed order, and the next thing she knows she woke up to EMS at [facility].  She left the hospital 2 days ago, went to the doctor yesterday afternoon because she was still vomiting.  [facility] gave her a hard time with probiotic and she was able to pick it up from [employer] last night.  She has not had any meds today but is wearing her insulin pump.  No emesis since she got the injection yesterday.  Gagged this morning, not nauseated.  No chest pain prior but she is having pressure now.      ER Course:  Possible cardiac arrest at home vs. Syncope.  Collapsed at [place], [place] started CPR and fire arrived and did the same.  AED placed and shock advised with CPR 6-7 minutes.  Came to with sputtering respiration.  Hypertension, has chest pain from compressions.  Troponin 38. EKG not really different.  Will consult cardiology.     Review of Systems: As mentioned in the history of present illness. All other systems reviewed and are negative. Past Medical History:  Diagnosis Date   Anemia    Anxiety    Arthritis    Asthma    CHF (congestive heart failure) ([place])    Depression    Diabetic ulcer of left foot ([place])  05/12/2013   Generalized abdominal pain    History of chicken pox    Loss of weight 12/02/2019   Migraines    Mood disorder ([place])    anxiety   Prurigo nodularis    with diabetic dermopathy   Type 1 diabetes, uncontrolled, with neuropathy    [place]   Ulcers of both lower legs ([place]) 02/20/2014   Past Surgical History:  Procedure Laterality Date   BIOPSY  08/11/2020   Procedure: BIOPSY;  Surgeon: Ladene Artist, MD;  Location: [facility];  Service: Endoscopy;;   BIOPSY  04/02/2022   Procedure: BIOPSY;  Surgeon: Jackquline Denmark, MD;  Location: [facility];  Service: Gastroenterology;;   ESOPHAGOGASTRODUODENOSCOPY (EGD) WITH PROPOFOL N/A 08/11/2020   Procedure: ESOPHAGOGASTRODUODENOSCOPY (EGD) WITH PROPOFOL;  Surgeon: Ladene Artist, MD;  Location: [facility];  Service: Endoscopy;  Laterality: N/A;   ESOPHAGOGASTRODUODENOSCOPY (EGD) WITH PROPOFOL N/A 04/02/2022   Procedure: ESOPHAGOGASTRODUODENOSCOPY (EGD) WITH PROPOFOL;  Surgeon: Jackquline Denmark, MD;  Location: [facility];  Service: Gastroenterology;  Laterality: N/A;   treadmill stress test  01/2013   WNL, low risk study   Social History:  reports that she has never smoked. She has never used smokeless tobacco. She reports that she does not currently use alcohol. She reports that she does not use drugs.  Allergies  Allergen Reactions   Amoxicillin Itching   Atorvastatin Other (  See Comments)    Dizziness    Dulaglutide Nausea And Vomiting   Lantus [Insulin Glargine] Itching and Rash   Miconazole Nitrate Rash    Family History  Problem Relation Age of Onset   Diabetes Mother        type 2   Hypertension Mother    Thyroid disease Mother    Bipolar disorder Mother    Heart disease Mother    Calcium disorder Mother    Cancer Maternal Grandmother        Breast, stomach   Cancer Paternal Grandmother        stomach, lung (smoker)   Diabetes Paternal Grandmother    Diabetes Paternal Grandfather    Heart failure Sister     Diabetes Sister    Stroke Maternal Aunt    Cancer Maternal Uncle        prostate   CAD Maternal Aunt        stents   Cancer Maternal Aunt 64       ovarian    Prior to Admission medications   Medication Sig Start Date End Date Taking? Authorizing Provider  acetaminophen (TYLENOL) 500 MG tablet Take 500 mg by mouth every 6 (six) hours as needed for moderate pain.    [provider]  albuterol (PROAIR HFA) 108 (90 Base) MCG/ACT inhaler Inhale 2 puffs into the lungs every 6 (six) hours as needed for wheezing or shortness of breath. 07/03/20   [name], PA-C  Carboxymethylcellul-Glycerin (LUBRICATING EYE DROPS OP) Place 1 drop into both eyes daily as needed (dry eyes).    [provider]  carvedilol (COREG) 12.5 MG tablet Take 1 tablet (12.5 mg total) by mouth 2 (two) times daily. 11/28/21   [name], MD  Continuous Blood Gluc Sensor (FREESTYLE LIBRE 2 SENSOR) MISC CHANGE EVERY 14 DAYS 03/20/20   [provider]  feeding supplement, GLUCERNA SHAKE, (GLUCERNA SHAKE) LIQD Take 237 mLs by mouth 2 (two) times daily between meals. Patient taking differently: Take 237 mLs by mouth 2 (two) times daily as needed (nutrition). 08/16/20   Mariel Aloe, MD  hydrOXYzine (ATARAX) 50 MG tablet Take 50 mg by mouth in the morning, at noon, and at bedtime. 10/01/21   [provider]  Insulin Disposable Pump (OMNIPOD 10 PACK) MISC by Does not apply route.    [provider]  NOVOLOG 100 UNIT/ML injection Inject 0-120 Units into the skin daily. 04/06/20   [provider]  pantoprazole (PROTONIX) 40 MG tablet Take 1 tablet (40 mg total) by mouth daily. 04/03/22 06/02/22  Debbe Odea, MD  polyethylene glycol (MIRALAX / GLYCOLAX) 17 g packet Take 17 g by mouth daily as needed for moderate constipation.    [provider]  pregabalin (LYRICA) 50 MG capsule Take 50 mg by mouth 2 (two) times daily as needed (neuropathy). 12/05/21   [provider]  promethazine (PHENERGAN) 12.5 MG tablet Take 1 tablet (12.5 mg total) by mouth every 6 (six) hours as needed for nausea or vomiting. 04/03/22   Debbe Odea, MD  tobramycin (TOBREX) 0.3 % ophthalmic solution Place 1 drop into both eyes See admin instructions. Instill 1-2 drop into both eyes 3 times daily the day before, of, and the day after monthly eye injections 11/17/20   [provider]  torsemide (DEMADEX) 20 MG tablet Take 20 mg by mouth daily as needed (swelling). 12/25/21   [provider]  TRESIBA FLEXTOUCH 200 UNIT/ML FlexTouch Pen Inject 50  Units into the skin daily as needed (when insulin pump stops working). 11/04/21   [provider]    Physical Exam: Vitals:   04/05/22 1415 04/05/22 1436 04/05/22 1459 04/05/22 1815  BP: (!) 181/109 (!) 177/107 (!) 167/100 (!) 176/106  Pulse: (!) 107 (!) 106 96   Resp: (!) 22 (!) 22 17   Temp:    98.2 F (36.8 C)  TempSrc:    Oral  SpO2: 95% 95% 96%    General:  Appears frail but comfortable and is in NAD; marked alopecia Eyes:  PERRL, EOMI, normal lids, iris ENT:  grossly normal hearing, lips & tongue, mmm Neck:  no LAD, masses or thyromegaly Cardiovascular:  RR with tachycardia, no m/r/g. No LE edema. Significant chest wall TTP, even to light touch Respiratory:   CTA bilaterally with no wheezes/rales/rhonchi.  Normal to mildly increased respiratory effort. Abdomen:  soft, mildly TTP diffusely, ND Skin:  no rash or induration seen on limited exam Musculoskeletal:  grossly normal tone BUE/BLE, good ROM, no bony abnormality Psychiatric: blunted mood and affect, speech fluent and appropriate, AOx3 Neurologic:  CN 2-12 grossly intact, moves all extremities in coordinated fashion   Radiological Exams on Admission: Independently reviewed - see discussion in A/P where applicable  DG Chest Portable 1 View  Result Date: 04/05/2022 CLINICAL DATA:  Syncopal episode EXAM: PORTABLE CHEST 1 VIEW COMPARISON:   None Available. FINDINGS: Enlarged cardiac silhouette. There is perihilar venous congestion. LEFT hemidiaphragm is partially obscured. Upper lungs clear. IMPRESSION: Cardiomegaly and venous congestion. Potential pleural fluid on the LEFT. Findings suggest volume overload. Electronically Signed   By: Suzy Bouchard M.D.   On: 04/05/2022 14:09    EKG: Independently reviewed.  Sinus tachycardia with rate 109; nonspecific ST changes with no evidence of acute ischemia   Labs on Admission: I have personally reviewed the available labs and imaging studies at the time of the admission.  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 Syncope vs. True arrest while standing in line at [place] -A nurse at the scene initiated CPR and Fire continued it upon arrival -Defibrillator applied and AED advised shock, which was given -She regained a pulse after several more minutes of CPR -She currently is reasonably stable other than chest wall pain -Troponin is mildly elevated with positive delta, but this appears to be more consistent with post-CPR troponin leak than ACS -Will admit to telemetry and continue to monitor -Cardiology has consulted and will continue to follow  Gastroparesis/gastritis -She has had 2 recent admissions for this and is having ongoing n/v -She is on PPI, will continue -She has not been given Reglan -She likely needs a gastric emptying study (and possible also anoscopy as outpatient per GI) -Will start empiric Reglan for now -Consider Carafate if  needed -Nutrition consult  DM -recent A1c was 6.3, indicative of good control -Continue insulin pump  Stage 3b CKD -Recently admitted with AKI -Renal function appears to be currently stable -However, given her advanced renal failure at such a young age, she appears to be  at high risk for progression to ESRD -Suggest outpatient nephrology f/u unless worsening as inpatient  HTN -Continue carvedilol  Chronic diastolic CHF -8/10 echo with EF 17-51%, grade 2 diastolic dysfunction with abnormal global longitudinal strain -Repeat limited echo was ordered by cardiology today -She appears to be clinically compensated from this issue currently -CXR, however, was concerning for volume overload -Will give a one-time dose of IV Lasix and resume home torsemide in the AM (but daily rather than prn for now)    Advance Care Planning:   Code Status: Full Code   Consults: Cardiology, nutrition  DVT Prophylaxis: Lovenox  Family Communication: None present;  I attempted to reach her son at the time of admission but there was no answer and I was unable to leave a voicemail  Severity of Illness: The appropriate patient status for this patient is INPATIENT. Inpatient status is judged to be reasonable and necessary in order to provide the required intensity of service to ensure the patient's safety. The patient's presenting symptoms, physical exam findings, and initial radiographic and laboratory data in the context of their chronic comorbidities is felt to place them at high risk for further clinical deterioration. Furthermore, it is not anticipated that the patient will be medically stable for discharge from the hospital within 2 midnights of admission.   * I certify that at the point of admission it is my clinical judgment that the patient will require inpatient hospital care spanning beyond 2 midnights from the point of admission due to high intensity of service, high risk for further deterioration  and high frequency of surveillance required.*  Author: Karmen Bongo, MD 04/05/2022 7:09 PM  For on call review [email or web address].

## 2022-04-05 NOTE — Consult Note (Signed)
Cardiology Consultation   Patient ID: [name] MRN: [id number]; DOB: [birth year]  Admit date: 04/05/2022 Date of Consult: 04/05/2022  PCP:  Delma Officer, PA   [facility] Providers Cardiologist:  Lesleigh Noe, MD     Patient Profile:   [name] is a 51 y.o. female with a hx of HTN, type 1 DM with gastroparesis, CKD stage 3b, chronic systolic chf, sickle cell trait who is being seen 04/05/2022 for the evaluation of cardiac arrest at the request of Dr. Ophelia Charter.   History of Present Illness:   [name] is a 51 year old female with above medical history who is followed by Dr. Katrinka Blazing. Per chart review, patient was previously followed by Dr. Odis Hollingshead, was referred to him in 12/2019 for evaluation of chest pain. Underwent echocardiogram on 12/06/2019 that showed EF 50-55%, mild LVH, grade III diastolic dysfunction. Also underwent nuclear stress test on 12/20/2019 that showed normal myocardial perrfusion, mild global reduction in myocardial thickening and LVEF 42%. Due to the low EF on stress test, patient underwent Coronary CT scan on 12/20/2019 that showed a coronary calcium score of 0, normal coronary arteries.   Patient transitioned her care to Dr. Katrinka Blazing to ask for a second opinion on her mildly reduced EF. She was seen on 03/13/2020, and at that time had severe orthostasis (BP dropped from 135/84 to 92/60 when going from sitting to standing). As patient had poorly controlled DM at that time, Dr. Katrinka Blazing was concerned for autonomic neuropathy and stopped her BP medications. Later started on torsemide and carvedilol.   She was later admitted to the hospital in 08/16/2020 for evaluation of severe N/V, found to have a splenic infarct and pleural effusions on CT. Echo that admission showed EF 40-45%, normal RV systolic function, moderately elevated pulmonary artery systolic pressure.   More recently, patient was admitted to [facility] from 03/25/22-03/28/22. She had presented complaining of  nausea, vomiting, and abdominal pain. Found to have AKI and was treated with IV fluids. Thought that her AKI was secondary to diabetic gastroparesis/significant nausea and dehydration. Echo on 03/26/2022 showed EF 50-55%, mild LVH, grade II diastolic dysfunction, normal RV systolic function.  She was also admitted from 04/01/22-04/03/22 for treatment of hematemesis. EGD showed LA grade a esophagitis, she was note bleeding at the time of EGD. GI saw patient, started protonix and Carafate.   Patient was BIB EMS on 9/1 after a syncopal episode vs cardiac arrest. Patient was at [facility] when she collapsed. RN started CPR and fire arrived. Patient was pulseless and apneic at the time of fire arrival, and they placed AED and shock was advised. Administered 1 round of CPR, 1 defibrillation. Patient woke up immediately after defibrillation.   Labs in the ED showed Na 140, K 4.2, creatinine 2.26, mag 1.7, WBC 8.8, hemoglobin 10.6, platelets 240. hsTn 38. CXR showed cardiomegaly and venous congestion, potential pleural fluid on the left. EKG showed sinus tachycardia with HR 109 BPM, nonspecific T wave changes. EKG appears very similar to EKG from 8/21.   She denies any chest soreness prior to her syncope.  Has significant chest wall / sternal soreness now following CPR  Breathing is ok    Past Medical History:  Diagnosis Date   Anemia    Anxiety    Arthritis    Asthma    CHF (congestive heart failure) (HCC)    Depression    Diabetic ulcer of left foot (HCC) 05/12/2013   Generalized abdominal pain  History of chicken pox    Loss of weight 12/02/2019   Migraines    Mood disorder (HCC)    anxiety   Prurigo nodularis    with diabetic dermopathy   Type 1 diabetes, uncontrolled, with neuropathy    Phadke   Ulcers of both lower legs ([place]) 02/20/2014    Past Surgical History:  Procedure Laterality Date   BIOPSY  08/11/2020   Procedure: BIOPSY;  Surgeon: Ladene Artist, MD;  Location: [facility];   Service: Endoscopy;;   BIOPSY  04/02/2022   Procedure: BIOPSY;  Surgeon: Jackquline Denmark, MD;  Location: [facility];  Service: Gastroenterology;;   ESOPHAGOGASTRODUODENOSCOPY (EGD) WITH PROPOFOL N/A 08/11/2020   Procedure: ESOPHAGOGASTRODUODENOSCOPY (EGD) WITH PROPOFOL;  Surgeon: Ladene Artist, MD;  Location: [facility];  Service: Endoscopy;  Laterality: N/A;   ESOPHAGOGASTRODUODENOSCOPY (EGD) WITH PROPOFOL N/A 04/02/2022   Procedure: ESOPHAGOGASTRODUODENOSCOPY (EGD) WITH PROPOFOL;  Surgeon: Jackquline Denmark, MD;  Location: [facility];  Service: Gastroenterology;  Laterality: N/A;   treadmill stress test  01/2013   WNL, low risk study     Home Medications:  Prior to Admission medications   Medication Sig Start Date End Date Taking? Authorizing Provider  acetaminophen (TYLENOL) 500 MG tablet Take 500 mg by mouth every 6 (six) hours as needed for moderate pain.   Yes [provider]  albuterol (PROAIR HFA) 108 (90 Base) MCG/ACT inhaler Inhale 2 puffs into the lungs every 6 (six) hours as needed for wheezing or shortness of breath. 07/03/20  Yes [name], PA-C  Carboxymethylcellul-Glycerin (LUBRICATING EYE DROPS OP) Place 1 drop into both eyes daily as needed (dry eyes).   Yes [provider]  carvedilol (COREG) 12.5 MG tablet Take 1 tablet (12.5 mg total) by mouth 2 (two) times daily. 11/28/21  Yes [name], MD  hydrOXYzine (ATARAX) 50 MG tablet Take 50 mg by mouth in the morning, at noon, and at bedtime. 10/01/21  Yes [provider]  NOVOLOG 100 UNIT/ML injection Inject 0-120 Units into the skin daily. 04/06/20  Yes [provider]  pantoprazole (PROTONIX) 40 MG tablet Take 1 tablet (40 mg total) by mouth daily. 04/03/22 06/02/22 Yes Debbe Odea, MD  polyethylene glycol (MIRALAX / GLYCOLAX) 17 g packet Take 17 g by mouth daily as needed for moderate constipation.   Yes [provider]  pregabalin (LYRICA) 50 MG capsule Take 50 mg by mouth  2 (two) times daily as needed (neuropathy). 12/05/21  Yes [provider]  promethazine (PHENERGAN) 12.5 MG tablet Take 1 tablet (12.5 mg total) by mouth every 6 (six) hours as needed for nausea or vomiting. 04/03/22  Yes Rizwan, Eunice Blase, MD  tobramycin (TOBREX) 0.3 % ophthalmic solution Place 1-2 drops into both eyes See admin instructions. Instill 1-2 drops into both eyes 3 times daily the day before, day of, and the day after monthly eye injections 11/17/20  Yes [provider]  torsemide (DEMADEX) 20 MG tablet Take 20 mg by mouth daily as needed (swelling). 12/25/21  Yes [provider]  TRESIBA FLEXTOUCH 200 UNIT/ML FlexTouch Pen Inject 50 Units into the skin daily as needed (when insulin pump stops working). 11/04/21  Yes [provider]  Continuous Blood Gluc Sensor (FREESTYLE LIBRE 2 SENSOR) MISC CHANGE EVERY 14 DAYS 03/20/20   [provider]  feeding supplement, GLUCERNA SHAKE, (GLUCERNA SHAKE) LIQD Take 237 mLs by mouth 2 (two) times daily between meals. Patient not taking: Reported on 04/05/2022 08/16/20   Mariel Aloe,  MD  Insulin Disposable Pump (OMNIPOD 10 PACK) MISC by Does not apply route.    [provider]    Inpatient Medications: Scheduled Meds:  Continuous Infusions:  PRN Meds:   Allergies:    Allergies  Allergen Reactions   Amoxicillin Itching   Atorvastatin     Dizziness    Dulaglutide Nausea And Vomiting   Lantus [Insulin Glargine] Itching and Rash   Miconazole Nitrate Rash    Social History:   Social History   Socioeconomic History   Marital status: Divorced    Spouse name: Not on file   Number of children: 2   Years of education: Not on file   Highest education level: Not on file  Occupational History   Occupation: Freight forwarder  Tobacco Use   Smoking status: Never   Smokeless tobacco: Never  Vaping Use   Vaping Use: Never used  Substance and Sexual Activity   Alcohol use: Not Currently   Drug use: No    Sexual activity: Yes    Partners: Male    Birth control/protection: None  Other Topics Concern   Not on file  Social History Narrative   Caffeine use: daily   Occupation: cleans houses   Regular exercise: yes, active 5x/wk   Diet: good water, fruits/vegetables daily   Social Determinants of Health   Financial Resource Strain: Not on file  Food Insecurity: Not on file  Transportation Needs: Not on file  Physical Activity: Not on file  Stress: Not on file  Social Connections: Not on file  Intimate Partner Violence: Not on file    Family History:     Family History  Problem Relation Age of Onset   Diabetes Mother        type 2   Hypertension Mother    Thyroid disease Mother    Bipolar disorder Mother    Heart disease Mother    Calcium disorder Mother    Cancer Maternal Grandmother        Breast, stomach   Cancer Paternal Grandmother        stomach, lung (smoker)   Diabetes Paternal Grandmother    Diabetes Paternal Grandfather    Heart failure Sister    Diabetes Sister    Stroke Maternal Aunt    Cancer Maternal Uncle        prostate   CAD Maternal Aunt        stents   Cancer Maternal Aunt 64       ovarian     ROS:  Please see the history of present illness.    All other ROS reviewed and negative.     Physical Exam/Data:   Vitals:   04/05/22 1407 04/05/22 1415 04/05/22 1436 04/05/22 1459  BP: (!) 173/103 (!) 181/109 (!) 177/107 (!) 167/100  Pulse: (!) 104 (!) 107 (!) 106 96  Resp: 20 (!) 22 (!) 22 17  Temp:      TempSrc:      SpO2: 95% 95% 95% 96%   No intake or output data in the 24 hours ending 04/05/22 1559    04/03/2022   11:52 AM 04/02/2022    1:36 PM 04/01/2022    6:20 PM  Last 3 Weights  Weight (lbs) 180 lb 11.2 oz 184 lb 8.4 oz 184 lb 9.6 oz  Weight (kg) 81.965 kg 83.7 kg 83.734 kg     There is no height or weight on file to calculate BMI.  General:  Well nourished, well developed, in no  acute distress ,  lots of chest wall pain ,,  unable  to sit up due to chest wall pain   HEENT: normal Neck: no JVD Vascular: No carotid bruits; Distal pulses 2+ bilaterally Cardiac:  normal S1, S2; RRR; no murmur    Her sternum is extremely sore .   Lungs:  clear to auscultation bilaterally, no wheezing, rhonchi or rales  Abd: soft, nontender, no hepatomegaly  Ext: no edema Musculoskeletal:  No deformities, BUE and BLE strength normal and equal Skin: warm and dry  Neuro:  CNs 2-12 intact, no focal abnormalities noted Psych:  Normal affect   EKG:  The EKG was personally reviewed and demonstrates:  NSR ,  TWI in inferior an lateral leads - unchanged from previous ecgs  Telemetry:  Telemetry was personally reviewed and demonstrates:  NSR   Relevant CV Studies:   Laboratory Data:  High Sensitivity Troponin:   Recent Labs  Lab 04/05/22 1406  TROPONINIHS 38*     Chemistry Recent Labs  Lab 04/02/22 0414 04/03/22 0411 04/05/22 1406  NA 144 142 140  K 4.3 3.8 4.2  CL 117* 118* 109  CO2 21* 21* 21*  GLUCOSE 173* 135* 141*  BUN 23* 22* 25*  CREATININE 2.28* 2.34* 2.26*  CALCIUM 8.2* 7.8* 8.6*  MG  --  1.9 1.7  GFRNONAA 25* 25* 26*  ANIONGAP 6 3* 10    Recent Labs  Lab 04/01/22 1900 04/05/22 1406  PROT 6.4* 6.2*  ALBUMIN 3.0* 2.7*  AST 15 21  ALT 13 16  ALKPHOS 69 73  BILITOT 0.8 0.8   Lipids No results for input(s): "CHOL", "TRIG", "HDL", "LABVLDL", "LDLCALC", "CHOLHDL" in the last 168 hours.  Hematology Recent Labs  Lab 04/02/22 1154 04/03/22 0411 04/05/22 1406  WBC 6.8 6.7 8.8  RBC 3.24* 3.32* 3.83*  HGB 9.2* 9.5* 10.6*  HCT 28.4* 29.6* 33.5*  MCV 87.7 89.2 87.5  MCH 28.4 28.6 27.7  MCHC 32.4 32.1 31.6  RDW 14.4 14.6 14.3  PLT 266 236 240   Thyroid No results for input(s): "TSH", "FREET4" in the last 168 hours.  BNPNo results for input(s): "BNP", "PROBNP" in the last 168 hours.  DDimer No results for input(s): "DDIMER" in the last 168 hours.   Radiology/Studies:  DG Chest Portable 1  View  Result Date: 04/05/2022 CLINICAL DATA:  Syncopal episode EXAM: PORTABLE CHEST 1 VIEW COMPARISON:  None Available. FINDINGS: Enlarged cardiac silhouette. There is perihilar venous congestion. LEFT hemidiaphragm is partially obscured. Upper lungs clear. IMPRESSION: Cardiomegaly and venous congestion. Potential pleural fluid on the LEFT. Findings suggest volume overload. Electronically Signed   By: Suzy Bouchard M.D.   On: 04/05/2022 14:09     Assessment and Plan:   Syncope:   possible cardiac arrest:  pt has had 1-2 weeks of gastroparesis, nausea, vomiting, hematemesis,   was last discharged 2 days ago.  Still has not been able to eat anything. She had no cp before passing out. Now has significant sternal pain   ECG shows no significant changes from previous ecgs Trops are 38,78 - likely from CPR and AED shock . Doubt these are reflective of ACS.   In addition , she has a creatinine of 2.26 Hb of 10.6 ( with recent hematemesis)   Had a normal coronary CTA in 2021  I dont think she needs a cath at this time.  Suggest :  Repeat Troponin in the am .  Needs hydration / protein / nutrition.  Will attempt  a limited echo to assess LV function - she is so tender that I'm not sure she will tolerate the echo probe on her chest.   Following   Risk Assessment/Risk Scores:    For questions or updates, please contact [place] Please consult [email or web address] for contact info under    Signed, Margie Billet, PA-C  04/05/2022 3:59 PM

## 2022-04-05 NOTE — ED Notes (Signed)
Second troponin elevated 78 from first one.  MD Yates aware.  No new orders at this time.

## 2022-04-05 NOTE — ED Triage Notes (Signed)
Pt BIB [facility] for eval of syncope vs cardiac arrest. EMS reports 911 was called for unresponsiveness. Per EMS on fires arrival, pt was pulseless and apneic. Fire placed AED, adminstered 1 round of CPR, 1 defibrillation. EMS reports pt woke up immediately after defibrillation and has remained GCS of 15 the entire time. Pt endorses chest pain.

## 2022-04-05 NOTE — ED Provider Notes (Signed)
Emergency Department Provider Note   I have reviewed the triage vital signs and the nursing notes.   HISTORY  Chief Complaint Loss of Consciousness and Cardiac Arrest   HPI [name] is a 51 y.o. female with PMH reviewed including IDDM, CHF, asthma, and recent admit for hematemesis with concern for gastroparesis presents to the ED with unresponsive episode at [facility] today. She has had improvement in her nausea and vomiting since yesterday. She went to [place] today for lunch and while waiting to order, she felt lightheaded and does not recall any events after this.   According to EMS and the fire captain the patient suddenly collapsed and was initially attended to by the nurse who did not feel a pulse.  She began CPR.  Fire arrived on scene and continued CPR after not feeling a carotid pulse.  They began to apply the AED and patient sat up to take a single gasping breath but then became unresponsive once again.  They continued CPR and the AED advised a shock, which was given.  They continued CPR for around 7 minutes but patient awoke and did not require intubation or further ACLS.   Past Medical History:  Diagnosis Date   Anemia    Anxiety    Arthritis    Asthma    CHF (congestive heart failure) (HCC)    Depression    Diabetic ulcer of left foot (HCC) 05/12/2013   Generalized abdominal pain    History of chicken pox    Loss of weight 12/02/2019   Migraines    Mood disorder (HCC)    anxiety   Prurigo nodularis    with diabetic dermopathy   Type 1 diabetes, uncontrolled, with neuropathy    Ph[place]ke   Ulcers of both lower legs (HCC) 02/20/2014    Review of Systems  Constitutional: No fever/chills Eyes: No visual changes. ENT: No sore throat. Cardiovascular: Positive chest pain. Respiratory: Denies shortness of breath. Gastrointestinal: No abdominal pain.  No nausea, no vomiting.  No diarrhea.  No constipation. Genitourinary: Negative for dysuria. Musculoskeletal:  Negative for back pain. Skin: Negative for rash. Neurological: Negative for headaches, focal weakness or numbness.  ____________________________________________   PHYSICAL EXAM:  VITAL SIGNS: ED Triage Vitals  Enc Vitals Group     BP 04/05/22 1344 (!) 181/100     Pulse Rate 04/05/22 1344 (!) 108     Resp 04/05/22 1344 16     Temp 04/05/22 1344 98 F (36.7 C)     Temp Source 04/05/22 1344 Oral     SpO2 04/05/22 1338 96 %   Constitutional: Alert and oriented. Well appearing and in no acute distress. Eyes: Conjunctivae are normal.  Head: Atraumatic. Nose: No congestion/rhinnorhea. Mouth/Throat: Mucous membranes are moist.  Neck: No stridor.  Cardiovascular: Tachycardia. Good peripheral circulation. Grossly normal heart sounds.   Respiratory: Normal respiratory effort.  No retractions. Lungs CTAB. Gastrointestinal: Soft and nontender. No distention.  Musculoskeletal: No lower extremity tenderness nor edema. No gross deformities of extremities. Neurologic:  Normal speech and language. No gross focal neurologic deficits are appreciated.  Skin:  Skin is warm, dry and intact. No rash noted.  ____________________________________________   LABS (all labs ordered are listed, but only abnormal results are displayed)  Labs Reviewed  RESP PANEL BY RT-PCR (FLU A&B, COVID) [id number]  COMPREHENSIVE METABOLIC PANEL  LIPASE, BLOOD  CBC WITH DIFFERENTIAL/PLATELET  MAGNESIUM  TROPONIN I (HIGH SENSITIVITY)   ____________________________________________  EKG   EKG Interpretation  Date/Time:  Friday  April 05 2022 13:49:40 EDT Ventricular Rate:  109 PR Interval:  141 QRS Duration: 85 QT Interval:  317 QTC Calculation: 427 R Axis:   76 Text Interpretation: Sinus tachycardia Probable left atrial enlargement Nonspecific T abnormalities, inferior leads Confirmed by Nanda Quinton [id number]) on 04/05/2022 2:57:14 PM        ____________________________________________  RADIOLOGY  DG  Chest Portable 1 View  Result Date: 04/05/2022 CLINICAL DATA:  Syncopal episode EXAM: PORTABLE CHEST 1 VIEW COMPARISON:  None Available. FINDINGS: Enlarged cardiac silhouette. There is perihilar venous congestion. LEFT hemidiaphragm is partially obscured. Upper lungs clear. IMPRESSION: Cardiomegaly and venous congestion. Potential pleural fluid on the LEFT. Findings suggest volume overload. Electronically Signed   By: Suzy Bouchard M.D.   On: 04/05/2022 14:09    ____________________________________________   PROCEDURES  Procedure(s) performed:   Procedures   ____________________________________________   INITIAL IMPRESSION / ASSESSMENT AND PLAN / ED COURSE  Pertinent labs & imaging results that were available during my care of the patient were reviewed by me and considered in my medical decision making (see chart for details).   This patient is Presenting for Evaluation of CP, which does require a range of treatment options, and is a complaint that involves a high risk of morbidity and mortality.  The Differential Diagnoses include ACS, PE, arrhythmia, COVID, etc.  Critical Interventions-    Medications - No data to display  Reassessment after intervention:     I *** Additional Historical Information from ***, as the patient is ***.  I decided to review pertinent External Data, and in summary ***.   Clinical Laboratory Tests Ordered, included   Radiologic Tests Ordered, included ***. I independently interpreted the images and agree with radiology interpretation.   Cardiac Monitor Tracing which shows ***   Social Determinants of Health Risk ***  Consult complete with  Medical Decision Making: Summary: ***  Reevaluation with update and discussion with   ***Considered admission***  Disposition:   ____________________________________________  FINAL CLINICAL IMPRESSION(S) / ED DIAGNOSES  Final diagnoses:  None     NEW OUTPATIENT MEDICATIONS STARTED DURING  THIS VISIT:  New Prescriptions   No medications on file    Note:  This document was prepared using Dragon voice recognition software and may include unintentional dictation errors.  Nanda Quinton, MD, [facility]

## 2022-04-05 NOTE — Progress Notes (Signed)
  Echocardiogram 2D Echocardiogram has been performed.  [name] 04/05/2022, 5:40 PM

## 2022-04-06 DIAGNOSIS — I469 Cardiac arrest, cause unspecified: Secondary | ICD-10-CM | POA: Diagnosis not present

## 2022-04-06 DIAGNOSIS — K3184 Gastroparesis: Secondary | ICD-10-CM

## 2022-04-06 DIAGNOSIS — E1143 Type 2 diabetes mellitus with diabetic autonomic (poly)neuropathy: Secondary | ICD-10-CM | POA: Diagnosis not present

## 2022-04-06 DIAGNOSIS — R112 Nausea with vomiting, unspecified: Secondary | ICD-10-CM | POA: Diagnosis not present

## 2022-04-06 DIAGNOSIS — E1040 Type 1 diabetes mellitus with diabetic neuropathy, unspecified: Secondary | ICD-10-CM

## 2022-04-06 LAB — GLUCOSE, CAPILLARY
Glucose-Capillary: 140 mg/dL — ABNORMAL HIGH (ref 70–99)
Glucose-Capillary: 145 mg/dL — ABNORMAL HIGH (ref 70–99)
Glucose-Capillary: 173 mg/dL — ABNORMAL HIGH (ref 70–99)
Glucose-Capillary: 212 mg/dL — ABNORMAL HIGH (ref 70–99)
Glucose-Capillary: 234 mg/dL — ABNORMAL HIGH (ref 70–99)
Glucose-Capillary: 270 mg/dL — ABNORMAL HIGH (ref 70–99)

## 2022-04-06 LAB — CBC
HCT: 28.4 % — ABNORMAL LOW (ref 36.0–46.0)
Hemoglobin: 9.3 g/dL — ABNORMAL LOW (ref 12.0–15.0)
MCH: 27.6 pg (ref 26.0–34.0)
MCHC: 32.7 g/dL (ref 30.0–36.0)
MCV: 84.3 fL (ref 80.0–100.0)
Platelets: 237 10*3/uL (ref 150–400)
RBC: 3.37 MIL/uL — ABNORMAL LOW (ref 3.87–5.11)
RDW: 14.3 % (ref 11.5–15.5)
WBC: 7.8 10*3/uL (ref 4.0–10.5)
nRBC: 0 % (ref 0.0–0.2)

## 2022-04-06 LAB — BASIC METABOLIC PANEL
Anion gap: 8 (ref 5–15)
BUN: 24 mg/dL — ABNORMAL HIGH (ref 6–20)
CO2: 21 mmol/L — ABNORMAL LOW (ref 22–32)
Calcium: 8.3 mg/dL — ABNORMAL LOW (ref 8.9–10.3)
Chloride: 111 mmol/L (ref 98–111)
Creatinine, Ser: 2.21 mg/dL — ABNORMAL HIGH (ref 0.44–1.00)
GFR, Estimated: 26 mL/min — ABNORMAL LOW (ref >60)
Glucose, Bld: 138 mg/dL — ABNORMAL HIGH (ref 70–99)
Potassium: 3.7 mmol/L (ref 3.5–5.1)
Sodium: 140 mmol/L (ref 135–145)

## 2022-04-06 LAB — TROPONIN I (HIGH SENSITIVITY): Troponin I (High Sensitivity): 7 ng/L (ref <18)

## 2022-04-06 LAB — MAGNESIUM: Magnesium: 1.7 mg/dL (ref 1.7–2.4)

## 2022-04-06 MED ORDER — MAGNESIUM SULFATE 2 GM/50ML IV SOLN
2.0000 g | Freq: Once | INTRAVENOUS | Status: AC
  Administered 2022-04-06: 2 g via INTRAVENOUS
  Filled 2022-04-06: qty 50

## 2022-04-06 MED ORDER — METOCLOPRAMIDE HCL 5 MG/ML IJ SOLN
10.0000 mg | Freq: Three times a day (TID) | INTRAMUSCULAR | Status: DC
  Administered 2022-04-06 – 2022-04-11 (×20): 10 mg via INTRAVENOUS
  Filled 2022-04-06 (×20): qty 2

## 2022-04-06 MED ORDER — ISOSORB DINITRATE-HYDRALAZINE 20-37.5 MG PO TABS
1.0000 | ORAL_TABLET | Freq: Three times a day (TID) | ORAL | Status: DC
  Administered 2022-04-06 – 2022-04-11 (×15): 1 via ORAL
  Filled 2022-04-06 (×15): qty 1

## 2022-04-06 MED ORDER — ROSUVASTATIN CALCIUM 20 MG PO TABS
20.0000 mg | ORAL_TABLET | Freq: Every day | ORAL | Status: DC
  Administered 2022-04-06 – 2022-04-11 (×6): 20 mg via ORAL
  Filled 2022-04-06 (×6): qty 1

## 2022-04-06 MED ORDER — POTASSIUM CHLORIDE CRYS ER 20 MEQ PO TBCR
40.0000 meq | EXTENDED_RELEASE_TABLET | Freq: Once | ORAL | Status: AC
  Administered 2022-04-06: 40 meq via ORAL
  Filled 2022-04-06: qty 2

## 2022-04-06 MED ORDER — INSULIN GLARGINE-YFGN 100 UNIT/ML ~~LOC~~ SOLN
10.0000 [IU] | Freq: Every day | SUBCUTANEOUS | Status: DC
  Administered 2022-04-06 – 2022-04-09 (×4): 10 [IU] via SUBCUTANEOUS
  Filled 2022-04-06 (×5): qty 0.1

## 2022-04-06 NOTE — Progress Notes (Signed)
Rounding Note    Patient Name: [name] Date of Encounter: 04/06/2022  [place] Cardiologist: Sinclair Grooms, MD   Subjective   Soreness in chest wall, no significant nausea  Inpatient Medications    Scheduled Meds:  carvedilol  12.5 mg Oral BID   docusate sodium  100 mg Oral BID   enoxaparin (LOVENOX) injection  30 mg Subcutaneous Q24H   hydrOXYzine  50 mg Oral TID   insulin aspart  0-9 Units Subcutaneous Q4H   insulin pump   Subcutaneous TID WC, HS, 0200   isosorbide-hydrALAZINE  1 tablet Oral TID   metoCLOPramide  10 mg Oral TID AC & HS   pantoprazole  40 mg Oral Daily   potassium chloride  40 mEq Oral Once   rosuvastatin  20 mg Oral Daily   sodium chloride flush  3 mL Intravenous Q12H   torsemide  20 mg Oral Daily   Continuous Infusions:  lactated ringers Stopped (04/06/22 0830)   magnesium sulfate bolus IVPB     promethazine (PHENERGAN) injection (IM or IVPB) 25 mg (04/05/22 2230)   PRN Meds: acetaminophen **OR** acetaminophen, albuterol, bisacodyl, hydrALAZINE, morphine injection, oxyCODONE, polyethylene glycol, pregabalin, promethazine (PHENERGAN) injection (IM or IVPB)   Vital Signs    Vitals:   04/05/22 2338 04/06/22 0329 04/06/22 0700 04/06/22 0836  BP: 132/78 (!) 153/90 (!) 162/95   Pulse: 87 87 87 98  Resp: (!) $RemoveB'21 18 18   'KAZZdIrK$ Temp: (!) 97.4 F (36.3 C)  97.8 F (36.6 C)   TempSrc: Axillary  Axillary   SpO2:   94% 95%    Intake/Output Summary (Last 24 hours) at 04/06/2022 1017 Last data filed at 04/06/2022 0800 Gross per 24 hour  Intake 60 ml  Output --  Net 60 ml      04/03/2022   11:52 AM 04/02/2022    1:36 PM 04/01/2022    6:20 PM  Last 3 Weights  Weight (lbs) 180 lb 11.2 oz 184 lb 8.4 oz 184 lb 9.6 oz  Weight (kg) 81.965 kg 83.7 kg 83.734 kg      Telemetry    No ventricular tachycardia- Personally Reviewed  ECG    No new- Personally Reviewed  Physical Exam   GEN: No acute distress.   Neck: No JVD Cardiac:  RRR, no murmurs, rubs, or gallops.  Respiratory: Clear to auscultation bilaterally. GI: Soft, nontender, non-distended  MS: No edema; No deformity. Neuro:  Nonfocal  Psych: Normal affect   Labs    High Sensitivity Troponin:   Recent Labs  Lab 04/05/22 1406 04/05/22 1534 04/05/22 1911 04/05/22 2029 04/06/22 0615  TROPONINIHS 38* 78* 155* 139* 7     Chemistry Recent Labs  Lab 04/01/22 1900 04/02/22 0414 04/03/22 0411 04/05/22 1406 04/06/22 0005 04/06/22 0615  NA 143   < > 142 140 140  --   K 3.9   < > 3.8 4.2 3.7  --   CL 114*   < > 118* 109 111  --   CO2 20*   < > 21* 21* 21*  --   GLUCOSE 153*   < > 135* 141* 138*  --   BUN 20   < > 22* 25* 24*  --   CREATININE 1.95*   < > 2.34* 2.26* 2.21*  --   CALCIUM 8.8*   < > 7.8* 8.6* 8.3*  --   MG  --   --  1.9 1.7  --  1.7  PROT 6.4*  --   --  6.2*  --   --   ALBUMIN 3.0*  --   --  2.7*  --   --   AST 15  --   --  21  --   --   ALT 13  --   --  16  --   --   ALKPHOS 69  --   --  73  --   --   BILITOT 0.8  --   --  0.8  --   --   GFRNONAA 31*   < > 25* 26* 26*  --   ANIONGAP 9   < > 3* 10 8  --    < > = values in this interval not displayed.    Lipids No results for input(s): "CHOL", "TRIG", "HDL", "LABVLDL", "LDLCALC", "CHOLHDL" in the last 168 hours.  Hematology Recent Labs  Lab 04/03/22 0411 04/05/22 1406 04/06/22 0005  WBC 6.7 8.8 7.8  RBC 3.32* 3.83* 3.37*  HGB 9.5* 10.6* 9.3*  HCT 29.6* 33.5* 28.4*  MCV 89.2 87.5 84.3  MCH 28.6 27.7 27.6  MCHC 32.1 31.6 32.7  RDW 14.6 14.3 14.3  PLT 236 240 237   Thyroid No results for input(s): "TSH", "FREET4" in the last 168 hours.  BNPNo results for input(s): "BNP", "PROBNP" in the last 168 hours.  DDimer No results for input(s): "DDIMER" in the last 168 hours.   Radiology    DG Chest Portable 1 View  Result Date: 04/05/2022 CLINICAL DATA:  Syncopal episode EXAM: PORTABLE CHEST 1 VIEW COMPARISON:  None Available. FINDINGS: Enlarged cardiac silhouette. There is  perihilar venous congestion. LEFT hemidiaphragm is partially obscured. Upper lungs clear. IMPRESSION: Cardiomegaly and venous congestion. Potential pleural fluid on the LEFT. Findings suggest volume overload. Electronically Signed   By: Suzy Bouchard M.D.   On: 04/05/2022 14:09    Cardiac Studies   Await ECHO  Patient Profile     51 y.o. female admitted with syncope, cardiac arrest, apparently was defibrillated x1 in the field.  Collapsed at [facility] pulseless and apneic at the time of EMS arrival.  AED was placed and shock was advised.  Patient woke up immediately after defibrillation.  Has nonischemic cardiomyopathy with prior EF of 40 to 45%.  Assessment & Plan    Cardiac arrest in the setting of chronic nonischemic cardiomyopathy EF 40 to 45% previously - EKG on arrival shows sinus tachycardia 109 with nonspecific ST-T wave changes.  No ST segment elevation. -Prior cardiac CT 2021 showed no CAD. -Most recent troponin normal. -Potassium was 3.7 on arrival, magnesium 1.7. -We will place EP consultation for possible defibrillator.  AED in the field advised shock which was given.  Type 1 diabetes with gastroparesis - Per primary team.  Has had periods of severe nausea and vomiting.  Chronic kidney disease stage IIIb - Avoid nephrotoxic agents      For questions or updates, please contact [place] Please consult [email or web address] for contact info under        Signed, Candee Furbish, MD  04/06/2022, 10:17 AM

## 2022-04-06 NOTE — Plan of Care (Signed)
  Problem: Education: Goal: Knowledge of General Education information will improve Description: Including pain rating scale, medication(s)/side effects and non-pharmacologic comfort measures Outcome: Progressing   Problem: Health Behavior/Discharge Planning: Goal: Ability to manage health-related needs will improve Outcome: Progressing   Problem: Clinical Measurements: Goal: Ability to maintain clinical measurements within normal limits will improve Outcome: Progressing Goal: Will remain free from infection Outcome: Progressing Goal: Diagnostic test results will improve Outcome: Progressing Goal: Respiratory complications will improve Outcome: Progressing Goal: Cardiovascular complication will be avoided Outcome: Progressing   Problem: Activity: Goal: Risk for activity intolerance will decrease Outcome: Progressing   Problem: Nutrition: Goal: Adequate nutrition will be maintained Outcome: Progressing   Problem: Coping: Goal: Level of anxiety will decrease Outcome: Progressing   Problem: Elimination: Goal: Will not experience complications related to bowel motility Outcome: Progressing Goal: Will not experience complications related to urinary retention Outcome: Progressing   Problem: Pain Managment: Goal: General experience of comfort will improve Outcome: Progressing   Problem: Safety: Goal: Ability to remain free from injury will improve Outcome: Progressing   Problem: Skin Integrity: Goal: Risk for impaired skin integrity will decrease Outcome: Progressing   Problem: Education: Goal: Ability to describe self-care measures that may prevent or decrease complications (Diabetes Survival Skills Education) will improve Outcome: Progressing Goal: Individualized Educational Video(s) Outcome: Progressing   Problem: Coping: Goal: Ability to adjust to condition or change in health will improve Outcome: Progressing   Problem: Fluid Volume: Goal: Ability to  maintain a balanced intake and output will improve Outcome: Progressing   Problem: Metabolic: Goal: Ability to maintain appropriate glucose levels will improve Outcome: Progressing   Problem: Health Behavior/Discharge Planning: Goal: Ability to identify and utilize available resources and services will improve Outcome: Progressing Goal: Ability to manage health-related needs will improve Outcome: Progressing   Problem: Nutritional: Goal: Maintenance of adequate nutrition will improve Outcome: Progressing Goal: Progress toward achieving an optimal weight will improve Outcome: Progressing   Problem: Skin Integrity: Goal: Risk for impaired skin integrity will decrease Outcome: Progressing   Problem: Tissue Perfusion: Goal: Adequacy of tissue perfusion will improve Outcome: Progressing

## 2022-04-06 NOTE — Progress Notes (Signed)
Per Dr Candiss Norse, he wanted to try soft diet for patient first before advancing to carb mod diet to see if patient can tolerate, she has had frequent nausea/vomiting yesterday and overnight, patient is refusing to accept soft diet first, she wants to have carb mod diet which better controls her sugar---I have changed from soft diet to carb mod diet, Dr Candiss Norse is aware---also, I have reordered IV team consult to start new IV that was accidentally pulled out this am--per Dr Candiss Norse, we need IV access due to recent cardiac arrest, patient made aware and agrees

## 2022-04-06 NOTE — Evaluation (Signed)
Physical Therapy Evaluation/ Discharge Patient Details Name: [name] MRN: [id number] DOB: 1971-02-18 Today's Date: 04/06/2022  History of Present Illness  51 yo female admitted 9/1 s/p cardiac arrest s/p 1 round of CPR and defib with ROSC. PMhx: HTn, DM, gastroparesis, CKD, CHF, sickle cell trait, depression, neuropathy  Clinical Impression  Pt very pleasant and reports 2 other admissions to [facility] with gastroparesis and general deconditioning related to this. Pt works full time as an Primary school teacher and lives with 2 sons who can assist as needed. Pt able to perform all basic transfers and gait without assist with decreased speed related to fatigue. Pt educated for walking program to return to prior function and for heart health. Pt does not require further therapy intervention at this time with pt aware and agreeable. Will sign off.     HR [id number]     Recommendations for follow up therapy are one component of a multi-disciplinary discharge planning process, led by the attending physician.  Recommendations may be updated based on patient status, additional functional criteria and insurance authorization.  Follow Up Recommendations No PT follow up      Assistance Recommended at Discharge PRN  Patient can return home with the following       Equipment Recommendations None recommended by PT  Recommendations for Other Services       Functional Status Assessment Patient has not had a recent decline in their functional status     Precautions / Restrictions Precautions Precautions: None Restrictions Weight Bearing Restrictions: No      Mobility  Bed Mobility               General bed mobility comments: in chair on arrival and end of session    Transfers Overall transfer level: Modified independent                      Ambulation/Gait Ambulation/Gait assistance: Modified independent (Device/Increase time) Gait Distance (Feet): 400 Feet Assistive device:  None Gait Pattern/deviations: Step-through pattern, Decreased stride length   Gait velocity interpretation: >2.62 ft/sec, indicative of community ambulatory   General Gait Details: pt with steady gait with decreased speed, pt with preference for wearing shoes but current shoes present with vomit on them and deferred to wearing only gripper socks today  Stairs Stairs: Yes Stairs assistance: Modified independent (Device/Increase time) Stair Management: Two rails, Alternating pattern, Forwards Number of Stairs: 3 General stair comments: use of rail for stability on stairs  Wheelchair Mobility    Modified Rankin (Stroke Patients Only)       Balance Overall balance assessment: Mild deficits observed, not formally tested                                           Pertinent Vitals/Pain Pain Assessment Pain Assessment: No/denies pain    Home Living Family/patient expects to be discharged to:: Private residence Living Arrangements: Children Available Help at Discharge: Family;Available 24 hours/day Type of Home: House Home Access: Stairs to enter   [employer] of Steps: 3   Home Layout: One level Home Equipment: None Additional Comments: works as a Freight forwarder at the [employer] Prior Level of Function : Independent/Modified Independent                     Journalist, newspaper  Extremity/Trunk Assessment   Upper Extremity Assessment Upper Extremity Assessment: Overall WFL for tasks assessed    Lower Extremity Assessment Lower Extremity Assessment: Overall WFL for tasks assessed    Cervical / Trunk Assessment Cervical / Trunk Assessment: Normal  Communication   Communication: No difficulties  Cognition Arousal/Alertness: Awake/alert Behavior During Therapy: WFL for tasks assessed/performed Overall Cognitive Status: Within Functional Limits for tasks assessed                                           General Comments      Exercises     Assessment/Plan    PT Assessment Patient does not need any further PT services  PT Problem List         PT Treatment Interventions      PT Goals (Current goals can be found in the Care Plan section)  Acute Rehab PT Goals PT Goal Formulation: All assessment and education complete, DC therapy    Frequency       Co-evaluation               AM-PAC PT "6 Clicks" Mobility  Outcome Measure Help needed turning from your back to your side while in a flat bed without using bedrails?: None Help needed moving from lying on your back to sitting on the side of a flat bed without using bedrails?: None Help needed moving to and from a bed to a chair (including a wheelchair)?: None Help needed standing up from a chair using your arms (e.g., wheelchair or bedside chair)?: None Help needed to walk in hospital room?: None Help needed climbing 3-5 steps with a railing? : None 6 Click Score: 24    End of Session   Activity Tolerance: Patient tolerated treatment well Patient left: in chair;with call bell/phone within reach Nurse Communication: Mobility status PT Visit Diagnosis: Other abnormalities of gait and mobility (R26.89)    Time: 7939-0300 PT Time Calculation (min) (ACUTE ONLY): 17 min   Charges:   PT Evaluation $PT Eval Low Complexity: 1 Low          [place], PT Acute Rehabilitation Services Office: [place] B Delmos Velaquez 04/06/2022, 10:17 AM

## 2022-04-06 NOTE — Consult Note (Addendum)
Referring Provider: Dr. Candiss Norse, [facility] Primary Care Physician:  [name], [place] Primary Gastroenterologist:  Dr. Loletha Carrow  Reason for Consultation:  Nausea and vomiting  HPI: [name] is a 51 y.o. 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  She was just discharged from the hospital on 8/24 after a stay for AKI.  Had been started on Jardiance a few months ago to see if it would help with edema/fluid overload.  That was discontinued at recent hospital admission, however.  Has not felt well for a couple of weeks.  Was seen by our service earlier this week at [facility] hospital after she had started vomiting coffee ground material.  Not on any acid reflux regimen at home despite previous Grade C esophagitis seen in 2022.  Underwent EGD as below, esophagitis was improved.  Her symptoms improved on pantoprazole and IV reglan.  She was discharged home on 8/30.  She tells me that she was getting ready to go to work yesterday.  Had stopped at [employer] to get a salad to take with her for lunch and then all that she remembers after that was EMS around her and she was on the ground.  Being admitted for cardiac arrest, AED advised shock in the field.  Seen by cardiology and going to be seen by EP for possible defibrillator.  She says that since that occurred she has had ongoing nausea and vomiting again.  More so dry heaving this morning.  No hematemesis.   EGD 08/2020:   - LA Grade C reflux esophagitis with no bleeding. - Focal erythematous mucosa in the gastric fundus. Biopsied. - A few gastric polyps. Biopsied. - Normal duodenal bulb and second portion of the duodenum.   A. STOMACH, BODY, POLYPECTOMY:  - Fundic gland polyp.   B. STOMACH, BODY AND ANTRUM, BIOPSY:  - Gastric antral mucosa with mild reactive  gastropathy.  - Gastric oxyntic mucosa with mild chronic gastritis.  - Warthin-Starry stain is negative for Helicobacter pylori.   C. STOMACH, FUNDUS, BIOPSY:  - Gastric oxyntic mucosa with mild chronic gastritis.    EGD 04/02/2022:  -LA Grade A reflux esophagitis with no bleeding. Biopsied. - A few gastric polyps. - No active bleeding.  A. DUODENUM, BIOPSY:  - Duodenal mucosa with normal villous architecture.  - No villous atrophy or increased intraepithelial lymphocytes.   B. ESOPHAGUS, BIOPSY:  - Squamous mucosa with mild reflux changes  Has never had a colonoscopy.    Past Medical History:  Diagnosis Date   Anemia    Anxiety    Arthritis    Asthma    CHF (congestive heart failure) ([place])    Depression    Diabetic ulcer of left foot ([place]) 05/12/2013   Generalized abdominal pain    History of chicken pox    Loss of weight 12/02/2019   Migraines    Mood disorder ([place])    anxiety   Prurigo nodularis    with diabetic dermopathy   Type 1 diabetes, uncontrolled, with neuropathy    [place]   Ulcers of both lower legs ([place]) 02/20/2014    Past Surgical History:  Procedure Laterality Date   BIOPSY  08/11/2020   Procedure: BIOPSY;  Surgeon: Ladene Artist, MD;  Location: [facility];  Service: Endoscopy;;   BIOPSY  04/02/2022   Procedure: BIOPSY;  Surgeon: Jackquline Denmark, MD;  Location:  [facility];  Service: Gastroenterology;;   ESOPHAGOGASTRODUODENOSCOPY (EGD) WITH PROPOFOL N/A 08/11/2020   Procedure: ESOPHAGOGASTRODUODENOSCOPY (EGD) WITH PROPOFOL;  Surgeon: Ladene Artist, MD;  Location: [facility];  Service: Endoscopy;  Laterality: N/A;   ESOPHAGOGASTRODUODENOSCOPY (EGD) WITH PROPOFOL N/A 04/02/2022   Procedure: ESOPHAGOGASTRODUODENOSCOPY (EGD) WITH PROPOFOL;  Surgeon: Jackquline Denmark, MD;  Location: [facility];  Service: Gastroenterology;  Laterality: N/A;   treadmill stress test  01/2013   WNL, low risk study    Prior to Admission medications   Medication Sig Start  Date End Date Taking? Authorizing Provider  acetaminophen (TYLENOL) 500 MG tablet Take 500 mg by mouth every 6 (six) hours as needed for moderate pain.   Yes [provider]  albuterol (PROAIR HFA) 108 (90 Base) MCG/ACT inhaler Inhale 2 puffs into the lungs every 6 (six) hours as needed for wheezing or shortness of breath. 07/03/20  Yes [name], PA-C  Carboxymethylcellul-Glycerin (LUBRICATING EYE DROPS OP) Place 1 drop into both eyes daily as needed (dry eyes).   Yes [provider]  carvedilol (COREG) 12.5 MG tablet Take 1 tablet (12.5 mg total) by mouth 2 (two) times daily. 11/28/21  Yes [name], MD  hydrOXYzine (ATARAX) 50 MG tablet Take 50 mg by mouth in the morning, at noon, and at bedtime. 10/01/21  Yes [provider]  NOVOLOG 100 UNIT/ML injection Inject 0-120 Units into the skin daily. 04/06/20  Yes [provider]  pantoprazole (PROTONIX) 40 MG tablet Take 1 tablet (40 mg total) by mouth daily. 04/03/22 06/02/22 Yes Debbe Odea, MD  polyethylene glycol (MIRALAX / GLYCOLAX) 17 g packet Take 17 g by mouth daily as needed for moderate constipation.   Yes [provider]  pregabalin (LYRICA) 50 MG capsule Take 50 mg by mouth 2 (two) times daily as needed (neuropathy). 12/05/21  Yes [provider]  promethazine (PHENERGAN) 12.5 MG tablet Take 1 tablet (12.5 mg total) by mouth every 6 (six) hours as needed for nausea or vomiting. 04/03/22  Yes Rizwan, Eunice Blase, MD  tobramycin (TOBREX) 0.3 % ophthalmic solution Place 1-2 drops into both eyes See admin instructions. Instill 1-2 drops into both eyes 3 times daily the day before, day of, and the day after monthly eye injections 11/17/20  Yes [provider]  torsemide (DEMADEX) 20 MG tablet Take 20 mg by mouth daily as needed (swelling). 12/25/21  Yes [provider]  TRESIBA FLEXTOUCH 200 UNIT/ML FlexTouch Pen Inject 50 Units into the skin daily as needed (when insulin pump  stops working). 11/04/21  Yes [provider]  Continuous Blood Gluc Sensor (FREESTYLE LIBRE 2 SENSOR) MISC CHANGE EVERY 14 DAYS 03/20/20   [provider]  Insulin Disposable Pump (OMNIPOD 10 PACK) MISC by Does not apply route.    [provider]    Current Facility-Administered Medications  Medication Dose Route Frequency Provider Last Rate Last Admin   acetaminophen (TYLENOL) tablet 650 mg  650 mg Oral Q6H PRN Karmen Bongo, MD       Or   acetaminophen (TYLENOL) suppository 650 mg  650 mg Rectal Q6H PRN Karmen Bongo, MD       albuterol (PROVENTIL) (2.5 MG/3ML) 0.083% nebulizer solution 2.5 mg  2.5 mg Nebulization Q6H PRN Karmen Bongo, MD       bisacodyl (DULCOLAX) EC tablet 5 mg  5 mg Oral Daily PRN Karmen Bongo, MD       carvedilol (COREG) tablet 12.5 mg  12.5 mg Oral BID Karmen Bongo, MD  docusate sodium (COLACE) capsule 100 mg  100 mg Oral BID Karmen Bongo, MD       enoxaparin (LOVENOX) injection 30 mg  30 mg Subcutaneous Q24H Karmen Bongo, MD   30 mg at 04/05/22 2212   hydrALAZINE (APRESOLINE) injection 5 mg  5 mg Intravenous Q4H PRN Karmen Bongo, MD   5 mg at 04/05/22 1841   hydrOXYzine (ATARAX) tablet 50 mg  50 mg Oral TID Karmen Bongo, MD       insulin aspart (novoLOG) injection 0-9 Units  0-9 Units Subcutaneous Q4H Shela Leff, MD       insulin pump   Subcutaneous TID WC, HS, 0200 Karmen Bongo, MD       isosorbide-hydrALAZINE (BIDIL) 20-37.5 MG per tablet 1 tablet  1 tablet Oral TID Thurnell Lose, MD       lactated ringers infusion   Intravenous Continuous Karmen Bongo, MD   Stopped at 04/06/22 0830   magnesium sulfate IVPB 2 g 50 mL  2 g Intravenous Once Thurnell Lose, MD       metoCLOPramide (REGLAN) tablet 10 mg  10 mg Oral TID AC & HS Karmen Bongo, MD   10 mg at 04/05/22 1837   morphine (PF) 2 MG/ML injection 2 mg  2 mg Intravenous Q2H PRN Karmen Bongo, MD   2 mg at 04/06/22 4193   oxyCODONE (Oxy  IR/ROXICODONE) immediate release tablet 5 mg  5 mg Oral Q4H PRN Karmen Bongo, MD   5 mg at 04/05/22 1837   pantoprazole (PROTONIX) EC tablet 40 mg  40 mg Oral Daily Karmen Bongo, MD   40 mg at 04/05/22 1837   polyethylene glycol (MIRALAX / GLYCOLAX) packet 17 g  17 g Oral Daily PRN Karmen Bongo, MD       potassium chloride SA (KLOR-CON M) CR tablet 40 mEq  40 mEq Oral Once Thurnell Lose, MD       pregabalin (LYRICA) capsule 50 mg  50 mg Oral BID PRN Karmen Bongo, MD       promethazine (PHENERGAN) 25 mg in sodium chloride 0.9 % 50 mL IVPB  25 mg Intravenous Q6H PRN Shela Leff, MD 200 mL/hr at 04/05/22 2230 25 mg at 04/05/22 2230   rosuvastatin (CRESTOR) tablet 20 mg  20 mg Oral Daily Thurnell Lose, MD       sodium chloride flush (NS) 0.9 % injection 3 mL  3 mL Intravenous Q12H Karmen Bongo, MD   3 mL at 04/05/22 2213   torsemide (DEMADEX) tablet 20 mg  20 mg Oral Daily Karmen Bongo, MD        Allergies as of 04/05/2022 - Review Complete 04/05/2022  Allergen Reaction Noted   Amoxicillin Itching 11/25/2017   Atorvastatin Other (See Comments) 04/23/2021   Dulaglutide Nausea And Vomiting 05/23/2020   Lantus [insulin glargine] Itching and Rash 11/25/2017   Miconazole nitrate Rash 05/23/2020    Family History  Problem Relation Age of Onset   Diabetes Mother        type 2   Hypertension Mother    Thyroid disease Mother    Bipolar disorder Mother    Heart disease Mother    Calcium disorder Mother    Cancer Maternal Grandmother        Breast, stomach   Cancer Paternal Grandmother        stomach, lung (smoker)   Diabetes Paternal Grandmother    Diabetes Paternal Grandfather    Heart failure Sister    Diabetes  Sister    Stroke Maternal Aunt    Cancer Maternal Uncle        prostate   CAD Maternal Aunt        stents   Cancer Maternal Aunt 64       ovarian    Social History   Socioeconomic History   Marital status: Divorced    Spouse name: Not on  file   Number of children: 2   Years of education: Not on file   Highest education level: Not on file  Occupational History   Occupation: Freight forwarder  Tobacco Use   Smoking status: Never   Smokeless tobacco: Never  Vaping Use   Vaping Use: Never used  Substance and Sexual Activity   Alcohol use: Not Currently   Drug use: No   Sexual activity: Yes    Partners: Male    Birth control/protection: None  Other Topics Concern   Not on file  Social History Narrative   Caffeine use: daily   Occupation: cleans houses   Regular exercise: yes, active 5x/wk   Diet: good water, fruits/vegetables daily   Social Determinants of Health   Financial Resource Strain: Not on file  Food Insecurity: Not on file  Transportation Needs: Not on file  Physical Activity: Not on file  Stress: Not on file  Social Connections: Not on file  Intimate Partner Violence: Not on file    Review of Systems: ROS is O/W negative except as mentioned in HPI.  Physical Exam: Vital signs in last 24 hours: Temp:  [97.4 F (36.3 C)-98.6 F (37 C)] 97.8 F (36.6 C) (09/02 0700) Pulse Rate:  [87-108] 98 (09/02 0836) Resp:  [16-34] 18 (09/02 0700) BP: (132-190)/(78-114) 162/95 (09/02 0700) SpO2:  [94 %-98 %] 95 % (09/02 0836) Last BM Date : 04/05/22 General:  Alert, Well-developed, well-nourished, pleasant and cooperative in NAD Head:  Normocephalic and atraumatic. Eyes:  Sclera clear, no icterus.  Conjunctiva pink. Ears:  Normal auditory acuity. Mouth:  No deformity or lesions.   Lungs:  Clear throughout to auscultation.  No wheezes, crackles, or rhonchi.  Heart:  Regular rate and rhythm; no murmurs, clicks, rubs, or gallops. Abdomen:  Soft, non-distended.  BS present.  Non-tender. Msk:  Symmetrical without gross deformities. Pulses:  Normal pulses noted. Extremities:  Without clubbing or edema. Neurologic:  Alert and oriented x 4;  grossly normal neurologically. Skin:  Intact without significant lesions or  rashes. Psych:  Alert and cooperative. Normal mood and affect.  Intake/Output this shift: Total I/O In: 60 [P.O.:60] Out: -   Lab Results: Recent Labs    04/05/22 1406 04/06/22 0005  WBC 8.8 7.8  HGB 10.6* 9.3*  HCT 33.5* 28.4*  PLT 240 237   BMET Recent Labs    04/05/22 1406 04/06/22 0005  NA 140 140  K 4.2 3.7  CL 109 111  CO2 21* 21*  GLUCOSE 141* 138*  BUN 25* 24*  CREATININE 2.26* 2.21*  CALCIUM 8.6* 8.3*   LFT Recent Labs    04/05/22 1406  PROT 6.2*  ALBUMIN 2.7*  AST 21  ALT 16  ALKPHOS 73  BILITOT 0.8   Studies/Results: DG Chest Portable 1 View  Result Date: 04/05/2022 CLINICAL DATA:  Syncopal episode EXAM: PORTABLE CHEST 1 VIEW COMPARISON:  None Available. FINDINGS: Enlarged cardiac silhouette. There is perihilar venous congestion. LEFT hemidiaphragm is partially obscured. Upper lungs clear. IMPRESSION: Cardiomegaly and venous congestion. Potential pleural fluid on the LEFT. Findings suggest volume overload. Electronically Signed  By: Suzy Bouchard M.D.   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  Symptoms had improved on IV Reglan as well, but was not sent home with any Reglan. *History of Grade C esophagitis seen on EGD 08/2020, ? If it was due to reflux or vomiting at that time.  Had not been on any acid reflux medications at home as of late.  EGD on 04/02/2022 actually showed improvement in the esophagitis with only Grade A esophagitis. *Normocytic anemia:  Hgb 9.3 grams, MCV is normal.  This appears to be near her baseline and similar to a week ago.  Likely due to her CKD.  No further hematemesis. *Chronic combined systolic (congestive) and diastolic (congestive) heart failure  *Stage 3b chronic kidney disease (CKD)  *Type 1 diabetes mellitus with diabetic neuropathy:  On insulin pump at home.   *Cardiac arrest:  AED  advised shock on the field.  Cards seeing the patient and consulting EP for possible defibrillator.  PLAN: -Switch Reglan to 10 mg IV ACHS for now and may want to consider sending her home with some as well. -Pantoprazole 40 mg daily. -Never had a colonoscopy so will need one as outpatient once able to tolerate bowel prep, is otherwise medically stable, etc. -Will likely need outpatient GES as well.   Laban Emperor. Zehr  04/06/2022, 9:28 AM  I have taken a history, reviewed the chart and examined the patient. I performed a substantive portion of this encounter, including complete performance of at least one of the key components, in conjunction with the APP. I agree with the APP's note, impression and recommendations  51 year old female with longstanding type 1 diabetes, history of combined heart failure, CKD admitted after a syncopal event, suspect cardiac arrest.  She has been hemodynamically stable since admission with plans for outpatient EP evaluation.  She has recurring problems with severe nausea and vomiting.  The patient states that she her symptoms had not been a problem for almost a year, until the past month and they have been persistent.  She has previously responded well to Reglan. I suspect she most likely has gastroparesis, although the episodic nature of her symptoms were a little atypical for diabetic gastroparesis.  Nevertheless, given her previous history of response to Reglan, I would recommend she remain on this regiment for the short-term.  She is aware of the potential side effects of tardive dyskinesia. She had a normal gastric emptying study in 2020, this may be worth repeating as an outpatient. Reglan 10 mg IV 4 times daily Continue pantoprazole for esophagitis Recommend gastroparetic diet (small, frequent meals, low-fat/low fiber)  Lemuel Boodram E. Candis Schatz, MD [facility]

## 2022-04-06 NOTE — Progress Notes (Signed)
PROGRESS NOTE                                                                                                                                                                                                             Patient Demographics:    [name], is a 51 y.o. female, DOB - 10-22-1970, [id number]  Outpatient Primary MD for the patient is [name], [place]    LOS - 1  Admit date - 04/05/2022    Chief Complaint  Patient presents with   Loss of Consciousness   Cardiac Arrest       Brief Narrative (HPI from H&P)    51 y.o. female with medical history significant of DM with gastroparesis, stage 4 CKD, and chronic diastolic CHF presenting with syncope vs. Cardiac arrest.  She was last admitted from 8/28-30 for hematemesis and EGD revealed grade A esophagitis with active bleeding at the time of scope.   Prior to that, she was admitted from 8/21-24 for AKI that was thought to be related to gastroparesis.    After her recent discharge she went to [place] to have lunch with standing in the line she passed out, up to not standing by initiated CPR, there was a AED device on hand which suggested a shock and it was given.  She was subsequently brought to the ER.  Post CPR she developed some substernal pain and discomfort.   Subjective:    [name] today has, No headache, mild substernal post CPR chest pain, No abdominal pain - No Nausea, No new weakness tingling or numbness, no SOB.   Assessment  & Plan :    Cardiac arrest.  She received appropriate CPR along with AED device prompted shock at a retail facility, troponin trend stable and in non-ACS pattern, EKG nonacute however limited repeat echo shows that her EF has dropped from recent 55% a month ago to 40 to 45%.  Cardiology is on board, currently on [place], will add statin for full secondary prevention in the light of her underlying diabetes mellitus as well.   Further work-up per cardiology.  Question if she now requires AICD with further cardiac work-up including left heart cath.  For now holding aspirin due to her ongoing issues with gastritis nausea and vomiting.  2.  Ongoing nausea vomiting recent diagnosis of gastroparesis along  with esophagitis.  On PPI, supportive care, she wants to have regular diet, GI has been requested to evaluate.  3.  CKD stage IV.  Creatinine seems to have plateaued around 2.2.  Avoid nephrotoxins and monitor.  Could pose left heart cath little challenging.  Sent renal ultrasound was nonacute.  4.  Chronic systolic and diastolic CHF EF now 40 to 45%.  Currently compensated.  Cardiology on board.  Continue home dose Demadex and Coreg for now.  5.  Hypokalemia.  Replaced.  6.  Hypertension.  On Coreg and Demadex.  Will add BiDil for better control.    7. DM. type II she wears insulin pump but has taken it off.  Currently on sliding scale.  Will monitor and adjust.  Lab Results  Component Value Date   HGBA1C 6.3 (H) 03/25/2022   CBG (last 3)  Recent Labs    04/05/22 2203 04/06/22 0609 04/06/22 0748  GLUCAP 119* 140* 145*  '      Condition - Fair  Family Communication  :  None present  Code Status :  Full  Consults  :  Cards, GI  PUD Prophylaxis : PPI   Procedures  :      TTE Limited -       Disposition Plan  :    Status is: Inpatient  DVT Prophylaxis  :    enoxaparin (LOVENOX) injection 30 mg Start: 04/05/22 2200    Lab Results  Component Value Date   PLT 237 04/06/2022    Diet :  Diet Order             DIET SOFT Room service appropriate? Yes; Fluid consistency: Thin  Diet effective now                    Inpatient Medications  Scheduled Meds:  carvedilol  12.5 mg Oral BID   docusate sodium  100 mg Oral BID   enoxaparin (LOVENOX) injection  30 mg Subcutaneous Q24H   hydrOXYzine  50 mg Oral TID   insulin aspart  0-9 Units Subcutaneous Q4H   insulin pump    Subcutaneous TID WC, HS, 0200   metoCLOPramide  10 mg Oral TID AC & HS   pantoprazole  40 mg Oral Daily   potassium chloride  40 mEq Oral Once   sodium chloride flush  3 mL Intravenous Q12H   torsemide  20 mg Oral Daily   Continuous Infusions:  lactated ringers 75 mL/hr at 04/06/22 0302   magnesium sulfate bolus IVPB     promethazine (PHENERGAN) injection (IM or IVPB) 25 mg (04/05/22 2230)   PRN Meds:.acetaminophen **OR** acetaminophen, albuterol, bisacodyl, hydrALAZINE, morphine injection, oxyCODONE, polyethylene glycol, pregabalin, promethazine (PHENERGAN) injection (IM or IVPB)  Antibiotics  :    Anti-infectives (From admission, onward)    None        Time Spent in minutes  30   [name] M.D on 04/06/2022 at 9:08 AM  To page go to [email or web address]   [facility] -  Office  [phone]  See all Orders from today for further details    Objective:   Vitals:   04/05/22 2338 04/06/22 0329 04/06/22 0700 04/06/22 0836  BP: 132/78 (!) 153/90 (!) 162/95   Pulse: 87 87 87 98  Resp: (!) $RemoveB'21 18 18   'jQOstzZO$ Temp: (!) 97.4 F (36.3 C)  97.8 F (36.6 C)   TempSrc: Axillary  Axillary   SpO2:   94% 95%    Wt Readings  from Last 3 Encounters:  04/03/22 82 kg  03/28/22 83.7 kg  12/14/21 86.8 kg     Intake/Output Summary (Last 24 hours) at 04/06/2022 0908 Last data filed at 04/06/2022 0800 Gross per 24 hour  Intake 60 ml  Output --  Net 60 ml     Physical Exam  Awake Alert, No new F.N deficits, Normal affect [place].AT,PERRAL Supple Neck, No JVD,   Symmetrical Chest wall movement, Good air movement bilaterally, CTAB RRR,No Gallops,Rubs or new Murmurs,  +ve B.Sounds, Abd Soft, No tenderness,   No Cyanosis, Clubbing or edema       Data Review:    CBC Recent Labs  Lab 04/01/22 1900 04/01/22 2231 04/02/22 0431 04/02/22 1154 04/03/22 0411 04/05/22 1406 04/06/22 0005  WBC 7.0   < > 7.6 6.8 6.7 8.8 7.8  HGB 10.7*   < > 9.6* 9.2* 9.5* 10.6* 9.3*  HCT 32.9*   <  > 31.6* 28.4* 29.6* 33.5* 28.4*  PLT 294   < > 238 266 236 240 237  MCV 86.4   < > 92.1 87.7 89.2 87.5 84.3  MCH 28.1   < > 28.0 28.4 28.6 27.7 27.6  MCHC 32.5   < > 30.4 32.4 32.1 31.6 32.7  RDW 14.1   < > 14.5 14.4 14.6 14.3 14.3  LYMPHSABS 0.9  --   --   --  2.7 1.5  --   MONOABS 0.1  --   --   --  0.3 0.4  --   EOSABS 0.0  --   --   --  0.1 0.1  --   BASOSABS 0.0  --   --   --  0.0 0.0  --    < > = values in this interval not displayed.    Electrolytes Recent Labs  Lab 04/01/22 1900 04/02/22 0414 04/03/22 0411 04/05/22 1406 04/06/22 0005 04/06/22 0615  NA 143 144 142 140 140  --   K 3.9 4.3 3.8 4.2 3.7  --   CL 114* 117* 118* 109 111  --   CO2 20* 21* 21* 21* 21*  --   GLUCOSE 153* 173* 135* 141* 138*  --   BUN 20 23* 22* 25* 24*  --   CREATININE 1.95* 2.28* 2.34* 2.26* 2.21*  --   CALCIUM 8.8* 8.2* 7.8* 8.6* 8.3*  --   AST 15  --   --  21  --   --   ALT 13  --   --  16  --   --   ALKPHOS 69  --   --  73  --   --   BILITOT 0.8  --   --  0.8  --   --   ALBUMIN 3.0*  --   --  2.7*  --   --   MG  --   --  1.9 1.7  --  1.7  INR 0.9  --   --   --   --   --     ------------------------------------------------------------------------------------------------------------------ No results for input(s): "CHOL", "HDL", "LDLCALC", "TRIG", "CHOLHDL", "LDLDIRECT" in the last 72 hours.  Lab Results  Component Value Date   HGBA1C 6.3 (H) 03/25/2022    No results for input(s): "TSH", "T4TOTAL", "T3FREE", "THYROIDAB" in the last 72 hours.  Invalid input(s): "FREET3" ------------------------------------------------------------------------------------------------------------------ ID Labs Recent Labs  Lab 04/01/22 1900 04/01/22 2231 04/02/22 0414 04/02/22 0431 04/02/22 1154 04/03/22 0411 04/05/22 1406 04/06/22 0005  WBC 7.0   < >  --  7.6 6.8 6.7 8.8 7.8  PLT 294   < >  --  238 266 236 240 237  CREATININE 1.95*  --  2.28*  --   --  2.34* 2.26* 2.21*   < > = values in  this interval not displayed.   Cardiac Enzymes No results for input(s): "CKMB", "TROPONINI", "MYOGLOBIN" in the last 168 hours.  Invalid input(s): "CK"   Radiology Reports DG Chest Portable 1 View  Result Date: 04/05/2022 CLINICAL DATA:  Syncopal episode EXAM: PORTABLE CHEST 1 VIEW COMPARISON:  None Available. FINDINGS: Enlarged cardiac silhouette. There is perihilar venous congestion. LEFT hemidiaphragm is partially obscured. Upper lungs clear. IMPRESSION: Cardiomegaly and venous congestion. Potential pleural fluid on the LEFT. Findings suggest volume overload. Electronically Signed   By: Suzy Bouchard M.D.   On: 04/05/2022 14:09

## 2022-04-06 NOTE — Progress Notes (Signed)
Patient wanted to reach out to the doctor to see if she still needs a IV. RN made aware, and RN to replace IV team consult if needed.

## 2022-04-07 ENCOUNTER — Inpatient Hospital Stay (HOSPITAL_COMMUNITY): Payer: BC Managed Care – PPO

## 2022-04-07 DIAGNOSIS — R112 Nausea with vomiting, unspecified: Secondary | ICD-10-CM

## 2022-04-07 DIAGNOSIS — I469 Cardiac arrest, cause unspecified: Secondary | ICD-10-CM | POA: Diagnosis not present

## 2022-04-07 DIAGNOSIS — K3184 Gastroparesis: Secondary | ICD-10-CM | POA: Diagnosis not present

## 2022-04-07 DIAGNOSIS — E1143 Type 2 diabetes mellitus with diabetic autonomic (poly)neuropathy: Secondary | ICD-10-CM

## 2022-04-07 LAB — CBC WITH DIFFERENTIAL/PLATELET
Abs Immature Granulocytes: 0.02 10*3/uL (ref 0.00–0.07)
Basophils Absolute: 0 10*3/uL (ref 0.0–0.1)
Basophils Relative: 0 %
Eosinophils Absolute: 0.2 10*3/uL (ref 0.0–0.5)
Eosinophils Relative: 2 %
HCT: 23.6 % — ABNORMAL LOW (ref 36.0–46.0)
Hemoglobin: 7.8 g/dL — ABNORMAL LOW (ref 12.0–15.0)
Immature Granulocytes: 0 %
Lymphocytes Relative: 35 %
Lymphs Abs: 2.3 10*3/uL (ref 0.7–4.0)
MCH: 28.1 pg (ref 26.0–34.0)
MCHC: 33.1 g/dL (ref 30.0–36.0)
MCV: 84.9 fL (ref 80.0–100.0)
Monocytes Absolute: 0.5 10*3/uL (ref 0.1–1.0)
Monocytes Relative: 7 %
Neutro Abs: 3.6 10*3/uL (ref 1.7–7.7)
Neutrophils Relative %: 56 %
Platelets: 200 10*3/uL (ref 150–400)
RBC: 2.78 MIL/uL — ABNORMAL LOW (ref 3.87–5.11)
RDW: 14.3 % (ref 11.5–15.5)
WBC: 6.5 10*3/uL (ref 4.0–10.5)
nRBC: 0 % (ref 0.0–0.2)

## 2022-04-07 LAB — ECHOCARDIOGRAM COMPLETE
Area-P 1/2: 4.49 cm2
S' Lateral: 3.1 cm

## 2022-04-07 LAB — OSMOLALITY: Osmolality: 307 mOsm/kg — ABNORMAL HIGH (ref 275–295)

## 2022-04-07 LAB — GLUCOSE, CAPILLARY
Glucose-Capillary: 181 mg/dL — ABNORMAL HIGH (ref 70–99)
Glucose-Capillary: 220 mg/dL — ABNORMAL HIGH (ref 70–99)
Glucose-Capillary: 186 mg/dL — ABNORMAL HIGH (ref 70–99)
Glucose-Capillary: 198 mg/dL — ABNORMAL HIGH (ref 70–99)
Glucose-Capillary: 224 mg/dL — ABNORMAL HIGH (ref 70–99)

## 2022-04-07 LAB — MAGNESIUM: Magnesium: 2.2 mg/dL (ref 1.7–2.4)

## 2022-04-07 LAB — COMPREHENSIVE METABOLIC PANEL
ALT: 11 U/L (ref 0–44)
AST: 10 U/L — ABNORMAL LOW (ref 15–41)
Albumin: 2 g/dL — ABNORMAL LOW (ref 3.5–5.0)
Alkaline Phosphatase: 53 U/L (ref 38–126)
Anion gap: 6 (ref 5–15)
BUN: 38 mg/dL — ABNORMAL HIGH (ref 6–20)
CO2: 22 mmol/L (ref 22–32)
Calcium: 7.7 mg/dL — ABNORMAL LOW (ref 8.9–10.3)
Chloride: 109 mmol/L (ref 98–111)
Creatinine, Ser: 3.26 mg/dL — ABNORMAL HIGH (ref 0.44–1.00)
GFR, Estimated: 17 mL/min — ABNORMAL LOW (ref >60)
Glucose, Bld: 306 mg/dL — ABNORMAL HIGH (ref 70–99)
Potassium: 4.5 mmol/L (ref 3.5–5.1)
Sodium: 137 mmol/L (ref 135–145)
Total Bilirubin: 0.5 mg/dL (ref 0.3–1.2)
Total Protein: 4.7 g/dL — ABNORMAL LOW (ref 6.5–8.1)

## 2022-04-07 LAB — SODIUM, URINE, RANDOM: Sodium, Ur: 46 mmol/L

## 2022-04-07 LAB — OSMOLALITY, URINE: Osmolality, Ur: 333 mOsm/kg (ref 300–900)

## 2022-04-07 LAB — CREATININE, URINE, RANDOM: Creatinine, Urine: 112 mg/dL

## 2022-04-07 MED ORDER — GLUCERNA SHAKE PO LIQD
237.0000 mL | Freq: Three times a day (TID) | ORAL | Status: DC
  Administered 2022-04-07 (×2): 237 mL via ORAL

## 2022-04-07 MED ORDER — ADULT MULTIVITAMIN W/MINERALS CH
1.0000 | ORAL_TABLET | Freq: Every day | ORAL | Status: DC
Start: 2022-04-07 — End: 2022-04-11
  Administered 2022-04-07 – 2022-04-11 (×5): 1 via ORAL
  Filled 2022-04-07 (×5): qty 1

## 2022-04-07 MED ORDER — LACTATED RINGERS IV SOLN: INTRAVENOUS | Status: DC

## 2022-04-07 NOTE — Progress Notes (Signed)
PROGRESS NOTE                                                                                                                                                                                                             Patient Demographics:    [name], is a 51 y.o. female, DOB - 11-21-1970, DQQ:[id number]  Outpatient Primary MD for the patient is [name], [place]    LOS - 2  Admit date - 04/05/2022    Chief Complaint  Patient presents with   Loss of Consciousness   Cardiac Arrest       Brief Narrative (HPI from H&P)    51 y.o. female with medical history significant of DM with gastroparesis, stage 4 CKD, and chronic diastolic CHF presenting with syncope vs. Cardiac arrest.  She was last admitted from 8/28-30 for hematemesis and EGD revealed grade A esophagitis with active bleeding at the time of scope.   Prior to that, she was admitted from 8/21-24 for AKI that was thought to be related to gastroparesis.    After her recent discharge she went to [place] to have lunch with standing in the line she passed out, up to not standing by initiated CPR, there was a AED device on hand which suggested a shock and it was given.  She was subsequently brought to the ER.  Post CPR she developed some substernal pain and discomfort.   Subjective:   Patient in bed, appears comfortable, denies any headache, no fever, mild post CPR substernal pain, no shortness of breath , no abdominal pain. No new focal weakness.    Assessment  & Plan :    Cardiac arrest.  She received appropriate CPR along with AED device prompted shock at a retail facility, troponin trend stable and in non-ACS pattern, EKG nonacute however limited repeat echo shows that her EF has dropped from recent 55% a month ago to 40 to 45%.  Cardiology is on board, currently on [place], will add statin for full secondary prevention in the light of her underlying diabetes  mellitus as well.  Further work-up per cardiology.  Question if she now requires AICD with further cardiac work-up including left heart cath.  For now holding aspirin due to her ongoing issues with gastritis nausea and vomiting.  2.  Ongoing nausea vomiting recent diagnosis of gastroparesis  along with esophagitis.  On PPI, supportive care, she wants to have regular diet, GI has been requested to evaluate.  3.  AKI on CKD stage IV.  Creatinine seems to have plateaued around 2.2.  Hold diuretics on 04/07/2022, IVF, repeat renal ultrasound, check fractional excretion of urea as she was recently on diuretics till yesterday, AKI make left heart cath a little challenging.     4.  Chronic systolic and diastolic CHF EF now 40 to 45%.  Currently compensated.  Cardiology on board.  Continue home dose Demadex and Coreg for now.  5.  Hypokalemia.  Replaced.  6.  Hypertension.  On Coreg and Demadex.  Will add BiDil for better control.    7. DM. type II she wears insulin pump but has taken it off.  Currently on sliding scale.  Will monitor and adjust.  Lab Results  Component Value Date   HGBA1C 6.3 (H) 03/25/2022   CBG (last 3)  Recent Labs    04/06/22 1939 04/06/22 2351 04/07/22 0356  GLUCAP 234* 270* 181*  '      Condition - Fair  Family Communication  :  None present  Code Status :  Full  Consults  :  Cards, GI  PUD Prophylaxis : PPI   Procedures  :     Renal ultrasound.    TTE Limited - Ef 45%      Disposition Plan  :    Status is: Inpatient  DVT Prophylaxis  :    enoxaparin (LOVENOX) injection 30 mg Start: 04/05/22 2200    Lab Results  Component Value Date   PLT 200 04/07/2022    Diet :  Diet Order             Diet Carb Modified Fluid consistency: Thin; Room service appropriate? Yes  Diet effective now                    Inpatient Medications  Scheduled Meds:  carvedilol  12.5 mg Oral BID   docusate sodium  100 mg Oral BID   enoxaparin (LOVENOX)  injection  30 mg Subcutaneous Q24H   feeding supplement (GLUCERNA SHAKE)  237 mL Oral TID BM   hydrOXYzine  50 mg Oral TID   insulin aspart  0-9 Units Subcutaneous Q4H   insulin glargine-yfgn  10 Units Subcutaneous Daily   insulin pump   Subcutaneous TID WC, HS, 0200   isosorbide-hydrALAZINE  1 tablet Oral TID   metoCLOPramide (REGLAN) injection  10 mg Intravenous TID AC & HS   multivitamin with minerals  1 tablet Oral Daily   pantoprazole  40 mg Oral Daily   rosuvastatin  20 mg Oral Daily   sodium chloride flush  3 mL Intravenous Q12H   Continuous Infusions:  lactated ringers 100 mL/hr at 04/07/22 3536   promethazine (PHENERGAN) injection (IM or IVPB) Stopped (04/06/22 1533)   PRN Meds:.acetaminophen **OR** acetaminophen, albuterol, bisacodyl, hydrALAZINE, oxyCODONE, polyethylene glycol, pregabalin, promethazine (PHENERGAN) injection (IM or IVPB)  Antibiotics  :    Anti-infectives (From admission, onward)    None        Time Spent in minutes  30   Lala Lund M.D on 04/07/2022 at 9:08 AM  To page go to [email or web address]   [facility] -  Office  [phone]  See all Orders from today for further details    Objective:   Vitals:   04/06/22 2353 04/07/22 0000 04/07/22 0406 04/07/22 0828  BP: (!) 93/53  111/71 111/66  Pulse:  81 76 76  Resp: $Remo'15 17 17 17  'flgEB$ Temp: 98.4 F (36.9 C)  98 F (36.7 C) 98 F (36.7 C)  TempSrc: Oral  Oral Oral  SpO2:  93% 93% 94%    Wt Readings from Last 3 Encounters:  04/03/22 82 kg  03/28/22 83.7 kg  12/14/21 86.8 kg     Intake/Output Summary (Last 24 hours) at 04/07/2022 0908 Last data filed at 04/07/2022 0300 Gross per 24 hour  Intake 586 ml  Output --  Net 586 ml     Physical Exam  Awake Alert, No new F.N deficits, Normal affect [place].AT,PERRAL Supple Neck, No JVD,   Symmetrical Chest wall movement, Good air movement bilaterally, CTAB RRR,No Gallops, Rubs or new Murmurs,  +ve B.Sounds, Abd Soft, No tenderness,   No  Cyanosis, Clubbing or edema     Data Review:    CBC Recent Labs  Lab 04/01/22 1900 04/01/22 2231 04/02/22 1154 04/03/22 0411 04/05/22 1406 04/06/22 0005 04/07/22 0000  WBC 7.0   < > 6.8 6.7 8.8 7.8 6.5  HGB 10.7*   < > 9.2* 9.5* 10.6* 9.3* 7.8*  HCT 32.9*   < > 28.4* 29.6* 33.5* 28.4* 23.6*  PLT 294   < > 266 236 240 237 200  MCV 86.4   < > 87.7 89.2 87.5 84.3 84.9  MCH 28.1   < > 28.4 28.6 27.7 27.6 28.1  MCHC 32.5   < > 32.4 32.1 31.6 32.7 33.1  RDW 14.1   < > 14.4 14.6 14.3 14.3 14.3  LYMPHSABS 0.9  --   --  2.7 1.5  --  2.3  MONOABS 0.1  --   --  0.3 0.4  --  0.5  EOSABS 0.0  --   --  0.1 0.1  --  0.2  BASOSABS 0.0  --   --  0.0 0.0  --  0.0   < > = values in this interval not displayed.    Electrolytes Recent Labs  Lab 04/01/22 1900 04/02/22 0414 04/03/22 0411 04/05/22 1406 04/06/22 0005 04/06/22 0615 04/07/22 0000  NA 143 144 142 140 140  --  137  K 3.9 4.3 3.8 4.2 3.7  --  4.5  CL 114* 117* 118* 109 111  --  109  CO2 20* 21* 21* 21* 21*  --  22  GLUCOSE 153* 173* 135* 141* 138*  --  306*  BUN 20 23* 22* 25* 24*  --  38*  CREATININE 1.95* 2.28* 2.34* 2.26* 2.21*  --  3.26*  CALCIUM 8.8* 8.2* 7.8* 8.6* 8.3*  --  7.7*  AST 15  --   --  21  --   --  10*  ALT 13  --   --  16  --   --  11  ALKPHOS 69  --   --  73  --   --  53  BILITOT 0.8  --   --  0.8  --   --  0.5  ALBUMIN 3.0*  --   --  2.7*  --   --  2.0*  MG  --   --  1.9 1.7  --  1.7 2.2  INR 0.9  --   --   --   --   --   --     ------------------------------------------------------------------------------------------------------------------ No results for input(s): "CHOL", "HDL", "LDLCALC", "TRIG", "CHOLHDL", "LDLDIRECT" in the last 72 hours.  Lab Results  Component Value Date   HGBA1C 6.3 (  H) 03/25/2022    No results for input(s): "TSH", "T4TOTAL", "T3FREE", "THYROIDAB" in the last 72 hours.  Invalid input(s):  "FREET3" ------------------------------------------------------------------------------------------------------------------ ID Labs Recent Labs  Lab 04/02/22 0414 04/02/22 0431 04/02/22 1154 04/03/22 0411 04/05/22 1406 04/06/22 0005 04/07/22 0000  WBC  --    < > 6.8 6.7 8.8 7.8 6.5  PLT  --    < > 266 236 240 237 200  CREATININE 2.28*  --   --  2.34* 2.26* 2.21* 3.26*   < > = values in this interval not displayed.   Cardiac Enzymes No results for input(s): "CKMB", "TROPONINI", "MYOGLOBIN" in the last 168 hours.  Invalid input(s): "CK"   Radiology Reports DG Chest Port 1 View  Result Date: 04/07/2022 CLINICAL DATA:  51 year old female with shortness of breath. EXAM: PORTABLE CHEST 1 VIEW COMPARISON:  Portable chest 04/05/2022 and earlier. FINDINGS: Portable AP semi upright view at 0643 hours. Stable lung volumes and mediastinal contours with cardiomegaly. Regressed pulmonary vascular congestion. Mildly improved ventilation. Residual streaky opacity at the left lung base. No pneumothorax or definite pleural effusion is evident. Visualized tracheal air column is within normal limits. No osseous abnormality identified. Paucity of bowel gas in the upper abdomen. IMPRESSION: 1. Regressed pulmonary interstitial edema and improved ventilation since yesterday. 2. Residual streaky opacity at the left lung base is nonspecific, favor atelectasis. Electronically Signed   By: Genevie Ann M.D.   On: 04/07/2022 08:41   DG Chest Portable 1 View  Result Date: 04/05/2022 CLINICAL DATA:  Syncopal episode EXAM: PORTABLE CHEST 1 VIEW COMPARISON:  None Available. FINDINGS: Enlarged cardiac silhouette. There is perihilar venous congestion. LEFT hemidiaphragm is partially obscured. Upper lungs clear. IMPRESSION: Cardiomegaly and venous congestion. Potential pleural fluid on the LEFT. Findings suggest volume overload. Electronically Signed   By: Suzy Bouchard M.D.   On: 04/05/2022 14:09

## 2022-04-07 NOTE — Progress Notes (Signed)
Rounding Note    Patient Name: [name] Date of Encounter: 04/07/2022  [place] Cardiologist: [name] III, MD   Subjective   Soreness in chest wall, no significant nausea, no shortness of breath.  She says she is very patient.  Inpatient Medications    Scheduled Meds:  carvedilol  12.5 mg Oral BID   docusate sodium  100 mg Oral BID   enoxaparin (LOVENOX) injection  30 mg Subcutaneous Q24H   feeding supplement (GLUCERNA SHAKE)  237 mL Oral TID BM   hydrOXYzine  50 mg Oral TID   insulin aspart  0-9 Units Subcutaneous Q4H   insulin glargine-yfgn  10 Units Subcutaneous Daily   insulin pump   Subcutaneous TID WC, HS, 0200   isosorbide-hydrALAZINE  1 tablet Oral TID   metoCLOPramide (REGLAN) injection  10 mg Intravenous TID AC & HS   multivitamin with minerals  1 tablet Oral Daily   pantoprazole  40 mg Oral Daily   rosuvastatin  20 mg Oral Daily   sodium chloride flush  3 mL Intravenous Q12H   Continuous Infusions:  lactated ringers 100 mL/hr at 04/07/22 0621   promethazine (PHENERGAN) injection (IM or IVPB) Stopped (04/06/22 1533)   PRN Meds: acetaminophen **OR** acetaminophen, albuterol, bisacodyl, hydrALAZINE, oxyCODONE, polyethylene glycol, pregabalin, promethazine (PHENERGAN) injection (IM or IVPB)   Vital Signs    Vitals:   04/06/22 2353 04/07/22 0000 04/07/22 0406 04/07/22 0828  BP: (!) 93/53  111/71 111/66  Pulse:  81 76 76  Resp: $Remo'15 17 17 17  'MAoeo$ Temp: 98.4 F (36.9 C)  98 F (36.7 C) 98 F (36.7 C)  TempSrc: Oral  Oral Oral  SpO2:  93% 93% 94%    Intake/Output Summary (Last 24 hours) at 04/07/2022 0932 Last data filed at 04/07/2022 0800 Gross per 24 hour  Intake 826 ml  Output --  Net 826 ml      04/03/2022   11:52 AM 04/02/2022    1:36 PM 04/01/2022    6:20 PM  Last 3 Weights  Weight (lbs) 180 lb 11.2 oz 184 lb 8.4 oz 184 lb 9.6 oz  Weight (kg) 81.965 kg 83.7 kg 83.734 kg      Telemetry    No ventricular tachycardia, no  changes- Personally Reviewed  ECG    No new- Personally Reviewed  Physical Exam   GEN: No acute distress.   Neck: No JVD Cardiac: RRR, no murmurs, rubs, or gallops.  Regular Respiratory: Clear to auscultation bilaterally.   GI: Soft, nontender, non-distended  MS: No edema; No deformity. Neuro:  Nonfocal  Psych: Normal affect   Labs    High Sensitivity Troponin:   Recent Labs  Lab 04/05/22 1406 04/05/22 1534 04/05/22 1911 04/05/22 2029 04/06/22 0615  TROPONINIHS 38* 78* 155* 139* 7     Chemistry Recent Labs  Lab 04/01/22 1900 04/02/22 0414 04/05/22 1406 04/06/22 0005 04/06/22 0615 04/07/22 0000  NA 143   < > 140 140  --  137  K 3.9   < > 4.2 3.7  --  4.5  CL 114*   < > 109 111  --  109  CO2 20*   < > 21* 21*  --  22  GLUCOSE 153*   < > 141* 138*  --  306*  BUN 20   < > 25* 24*  --  38*  CREATININE 1.95*   < > 2.26* 2.21*  --  3.26*  CALCIUM 8.8*   < >  8.6* 8.3*  --  7.7*  MG  --    < > 1.7  --  1.7 2.2  PROT 6.4*  --  6.2*  --   --  4.7*  ALBUMIN 3.0*  --  2.7*  --   --  2.0*  AST 15  --  21  --   --  10*  ALT 13  --  16  --   --  11  ALKPHOS 69  --  73  --   --  53  BILITOT 0.8  --  0.8  --   --  0.5  GFRNONAA 31*   < > 26* 26*  --  17*  ANIONGAP 9   < > 10 8  --  6   < > = values in this interval not displayed.    Lipids No results for input(s): "CHOL", "TRIG", "HDL", "LABVLDL", "LDLCALC", "CHOLHDL" in the last 168 hours.  Hematology Recent Labs  Lab 04/05/22 1406 04/06/22 0005 04/07/22 0000  WBC 8.8 7.8 6.5  RBC 3.83* 3.37* 2.78*  HGB 10.6* 9.3* 7.8*  HCT 33.5* 28.4* 23.6*  MCV 87.5 84.3 84.9  MCH 27.7 27.6 28.1  MCHC 31.6 32.7 33.1  RDW 14.3 14.3 14.3  PLT 240 237 200   Thyroid No results for input(s): "TSH", "FREET4" in the last 168 hours.  BNPNo results for input(s): "BNP", "PROBNP" in the last 168 hours.  DDimer No results for input(s): "DDIMER" in the last 168 hours.   Radiology    DG Chest Port 1 View  Result Date:  04/07/2022 CLINICAL DATA:  51 year old female with shortness of breath. EXAM: PORTABLE CHEST 1 VIEW COMPARISON:  Portable chest 04/05/2022 and earlier. FINDINGS: Portable AP semi upright view at 0643 hours. Stable lung volumes and mediastinal contours with cardiomegaly. Regressed pulmonary vascular congestion. Mildly improved ventilation. Residual streaky opacity at the left lung base. No pneumothorax or definite pleural effusion is evident. Visualized tracheal air column is within normal limits. No osseous abnormality identified. Paucity of bowel gas in the upper abdomen. IMPRESSION: 1. Regressed pulmonary interstitial edema and improved ventilation since yesterday. 2. Residual streaky opacity at the left lung base is nonspecific, favor atelectasis. Electronically Signed   By: Genevie Ann M.D.   On: 04/07/2022 08:41   DG Chest Portable 1 View  Result Date: 04/05/2022 CLINICAL DATA:  Syncopal episode EXAM: PORTABLE CHEST 1 VIEW COMPARISON:  None Available. FINDINGS: Enlarged cardiac silhouette. There is perihilar venous congestion. LEFT hemidiaphragm is partially obscured. Upper lungs clear. IMPRESSION: Cardiomegaly and venous congestion. Potential pleural fluid on the LEFT. Findings suggest volume overload. Electronically Signed   By: Suzy Bouchard M.D.   On: 04/05/2022 14:09    Cardiac Studies   Awaiting echocardiogram  Patient Profile     51 y.o. female admitted with syncope, cardiac arrest, apparently was defibrillated x1 in the field.  Collapsed at [facility] pulseless and apneic at the time of EMS arrival.  AED was placed and shock was advised.  Patient woke up immediately after defibrillation.  Has nonischemic cardiomyopathy with prior EF of 40 to 45%.  Assessment & Plan    Cardiac arrest in the setting of chronic nonischemic cardiomyopathy EF 40 to 45% previously - EKG on arrival shows sinus tachycardia 109 with nonspecific ST-T wave changes.  No ST segment elevation. -Prior cardiac CT 2021  showed no CAD. -Most recent troponin normal. -Potassium was 3.7 on arrival, magnesium 1.7. - Awaiting EP consultation for possible defibrillator.  AED in  the field advised shock which was given.  Type 1 diabetes with gastroparesis - Per primary team.  Has had periods of severe nausea and vomiting.  Chronic kidney disease stage IIIb - Avoid nephrotoxic agents      For questions or updates, please contact [place] Please consult [email or web address] for contact info under        Signed, Candee Furbish, MD  04/07/2022, 9:32 AM

## 2022-04-07 NOTE — Progress Notes (Signed)
Initial Nutrition Assessment  DOCUMENTATION CODES:   Not applicable  INTERVENTION:   -Glucerna Shake po TID, each supplement provides 220 kcal and 10 grams of protein  -MVI with minerals daily  NUTRITION DIAGNOSIS:   Increased nutrient needs related to chronic illness (CHF) as evidenced by estimated needs.  GOAL:   Patient will meet greater than or equal to 90% of their needs  MONITOR:   PO intake, Supplement acceptance  REASON FOR ASSESSMENT:   Consult, Malnutrition Screening Tool Assessment of nutrition requirement/status  ASSESSMENT:   Pt with medical history significant of DM with gastroparesis, stage 4 CKD, and chronic diastolic CHF presenting with syncope vs. Cardiac arrest.  Pt admitted with cardiac arrest in the setting of chronic nonischemic cardiomyopathy.   Reviewed I/O's: +646 ml x 24 hours  Pt unavailable at time of visit. Attempted to speak with pt via call to hospital room phone, however, unable to reach. RD unable to obtain further nutrition-related history or complete nutrition-focused physical exam at this time.    Per GI notes, pt with improved esophagitis compared to EGD earlier this week. Symptoms have improved with reglan.   Pt currently on a carb modified diet; noted meal completions 75-80%.   Reviewed wt hx; pt has experienced a 3.8% wt loss over the past 4 months, which is not significant for time frame.   Medications reviewed and include colace, reglan, and lactated ringers infusion @ 100 ml/hr.   Lab Results  Component Value Date   HGBA1C 6.3 (H) 03/25/2022   PTA DM medications are 50 units tresiba PRN and omnipod insulin pump. Pt with insulin pump PTA, but is not currently using in the hopsital.    Labs reviewed: CBGS: 181 (inpatient orders for glycemic control are 0-9 units insulin aspart every 4 hours and 10 units insulin glargine-yfgn daily).    Diet Order:   Diet Order             Diet Carb Modified Fluid consistency: Thin; Room  service appropriate? Yes  Diet effective now                   EDUCATION NEEDS:   No education needs have been identified at this time  Skin:  Skin Assessment: Reviewed RN Assessment  Last BM:  04/06/22  Height:   Ht Readings from Last 1 Encounters:  04/03/22 6' (1.829 m)    Weight:   Wt Readings from Last 1 Encounters:  04/03/22 82 kg    Ideal Body Weight:  72.7 kg  BMI:  There is no height or weight on file to calculate BMI.  Estimated Nutritional Needs:   Kcal:  2000-2200  Protein:  105-120 grams  Fluid:  > 2 L    Loistine Chance, RD, LDN, [place] Registered Dietitian II Certified Diabetes Care and Education Specialist Please refer to [facility] for RD and/or RD on-call/weekend/after hours pager

## 2022-04-07 NOTE — Progress Notes (Signed)
*  PRELIMINARY RESULTS* Echocardiogram 2D Echocardiogram has been performed.  [name] 04/07/2022, 2:03 PM

## 2022-04-07 NOTE — Progress Notes (Signed)
[facility] ROUNDING NOTE   Subjective: Patient feeling much better today from a GI standpoint.  She denies any nausea, and reports that she ate "a lot" yesterday.  No abdominal pain.  Her creatinine has continued to rise.  She had a drop in her hemoglobin, but denies any evidence of blood in her stool.  She reports having hematemesis her last admission, but has not had any hematemesis since before her last endoscopy   Objective: Vital signs in last 24 hours: Temp:  [98 F (36.7 C)-98.7 F (37.1 C)] 98 F (36.7 C) (09/03 0406) Pulse Rate:  [76-98] 76 (09/03 0406) Resp:  [15-21] 17 (09/03 0406) BP: (93-126)/(53-74) 111/71 (09/03 0406) SpO2:  [93 %-97 %] 93 % (09/03 0406) Last BM Date : 04/06/22 General: NAD, pleasant African-American female Lungs:  CTA b/l, no w/r/r Heart:  RRR, no m/r/g Abdomen:  Soft, NT, ND, +BS Ext:  No c/c/e    Intake/Output from previous day: 09/02 0701 - 09/03 0700 In: 646 [P.O.:540; I.V.:6; IV Piggyback:100] Out: -  Intake/Output this shift: No intake/output data recorded.   Lab Results: Recent Labs    04/05/22 1406 04/06/22 0005 04/07/22 0000  WBC 8.8 7.8 6.5  HGB 10.6* 9.3* 7.8*  PLT 240 237 200  MCV 87.5 84.3 84.9   BMET Recent Labs    04/05/22 1406 04/06/22 0005 04/07/22 0000  NA 140 140 137  K 4.2 3.7 4.5  CL 109 111 109  CO2 21* 21* 22  GLUCOSE 141* 138* 306*  BUN 25* 24* 38*  CREATININE 2.26* 2.21* 3.26*  CALCIUM 8.6* 8.3* 7.7*   LFT Recent Labs    04/05/22 1406 04/07/22 0000  PROT 6.2* 4.7*  ALBUMIN 2.7* 2.0*  AST 21 10*  ALT 16 11  ALKPHOS 73 53  BILITOT 0.8 0.5   PT/INR No results for input(s): "INR" in the last 72 hours.    Imaging/Other results: DG Chest Portable 1 View  Result Date: 04/05/2022 CLINICAL DATA:  Syncopal episode EXAM: PORTABLE CHEST 1 VIEW COMPARISON:  None Available. FINDINGS: Enlarged cardiac silhouette. There is perihilar venous congestion. LEFT hemidiaphragm is partially  obscured. Upper lungs clear. IMPRESSION: Cardiomegaly and venous congestion. Potential pleural fluid on the LEFT. Findings suggest volume overload. Electronically Signed   By: Suzy Bouchard M.D.   On: 04/05/2022 14:09      Assessment and Plan:  51 year old female with longstanding type 1 diabetes, history of combined heart failure, CKD admitted after a syncopal event, suspect cardiac arrest.  She has been hemodynamically stable since admission with plans for outpatient EP evaluation.  She has recurring problems with severe nausea and vomiting.  The patient states that she her symptoms had not been a problem for almost a year, until the past month and they have been persistent.  She has previously responded well to Reglan. I suspect she most likely has gastroparesis, although the episodic nature of her symptoms were a little atypical for diabetic gastroparesis.  Nevertheless, given her previous history of response to Reglan, I would recommend she remain on this regiment for the short-term.  She is aware of the potential side effects of tardive dyskinesia.  She is currently doing very well on the IV reglan from a GI standpoint  - Continue Reglan 10 mg IV 4 times daily, consider switching to PO tomorrow - Continue pantoprazole for esophagitis - Gastroparetic diet (small, frequent meals, low-fat/low fiber) - Recommend discharging patient on Reglan 10 mg PO QID. - Will arrange outpatient follow up  with Dr. Loletha Carrow to determine guide further Reglan use and consider repeat GES.    Daryel November, MD  04/07/2022, 8:04 AM [facility]

## 2022-04-08 ENCOUNTER — Inpatient Hospital Stay (HOSPITAL_COMMUNITY): Payer: BC Managed Care – PPO

## 2022-04-08 DIAGNOSIS — N179 Acute kidney failure, unspecified: Secondary | ICD-10-CM

## 2022-04-08 DIAGNOSIS — E1040 Type 1 diabetes mellitus with diabetic neuropathy, unspecified: Secondary | ICD-10-CM | POA: Diagnosis not present

## 2022-04-08 DIAGNOSIS — E1122 Type 2 diabetes mellitus with diabetic chronic kidney disease: Secondary | ICD-10-CM

## 2022-04-08 DIAGNOSIS — K209 Esophagitis, unspecified without bleeding: Secondary | ICD-10-CM

## 2022-04-08 DIAGNOSIS — I5042 Chronic combined systolic (congestive) and diastolic (congestive) heart failure: Secondary | ICD-10-CM

## 2022-04-08 DIAGNOSIS — E1143 Type 2 diabetes mellitus with diabetic autonomic (poly)neuropathy: Secondary | ICD-10-CM

## 2022-04-08 DIAGNOSIS — E876 Hypokalemia: Secondary | ICD-10-CM

## 2022-04-08 DIAGNOSIS — R112 Nausea with vomiting, unspecified: Secondary | ICD-10-CM

## 2022-04-08 DIAGNOSIS — I469 Cardiac arrest, cause unspecified: Secondary | ICD-10-CM | POA: Diagnosis not present

## 2022-04-08 DIAGNOSIS — K3184 Gastroparesis: Secondary | ICD-10-CM

## 2022-04-08 DIAGNOSIS — I130 Hypertensive heart and chronic kidney disease with heart failure and stage 1 through stage 4 chronic kidney disease, or unspecified chronic kidney disease: Secondary | ICD-10-CM

## 2022-04-08 DIAGNOSIS — N184 Chronic kidney disease, stage 4 (severe): Secondary | ICD-10-CM

## 2022-04-08 LAB — CBC WITH DIFFERENTIAL/PLATELET
Abs Immature Granulocytes: 0.01 10*3/uL (ref 0.00–0.07)
Basophils Absolute: 0 10*3/uL (ref 0.0–0.1)
Basophils Relative: 0 %
Eosinophils Absolute: 0.2 10*3/uL (ref 0.0–0.5)
Eosinophils Relative: 2 %
HCT: 24 % — ABNORMAL LOW (ref 36.0–46.0)
Hemoglobin: 7.7 g/dL — ABNORMAL LOW (ref 12.0–15.0)
Immature Granulocytes: 0 %
Lymphocytes Relative: 31 %
Lymphs Abs: 2 10*3/uL (ref 0.7–4.0)
MCH: 27.7 pg (ref 26.0–34.0)
MCHC: 32.1 g/dL (ref 30.0–36.0)
MCV: 86.3 fL (ref 80.0–100.0)
Monocytes Absolute: 0.4 10*3/uL (ref 0.1–1.0)
Monocytes Relative: 6 %
Neutro Abs: 3.9 10*3/uL (ref 1.7–7.7)
Neutrophils Relative %: 61 %
Platelets: 235 10*3/uL (ref 150–400)
RBC: 2.78 MIL/uL — ABNORMAL LOW (ref 3.87–5.11)
RDW: 14.2 % (ref 11.5–15.5)
WBC: 6.4 10*3/uL (ref 4.0–10.5)
nRBC: 0 % (ref 0.0–0.2)

## 2022-04-08 LAB — IRON AND TIBC
Iron: 32 ug/dL (ref 28–170)
Saturation Ratios: 15 % (ref 10.4–31.8)
TIBC: 218 ug/dL — ABNORMAL LOW (ref 250–450)
UIBC: 186 ug/dL

## 2022-04-08 LAB — COMPREHENSIVE METABOLIC PANEL
ALT: 9 U/L (ref 0–44)
AST: 10 U/L — ABNORMAL LOW (ref 15–41)
Albumin: 2.1 g/dL — ABNORMAL LOW (ref 3.5–5.0)
Alkaline Phosphatase: 54 U/L (ref 38–126)
Anion gap: 9 (ref 5–15)
BUN: 41 mg/dL — ABNORMAL HIGH (ref 6–20)
CO2: 19 mmol/L — ABNORMAL LOW (ref 22–32)
Calcium: 8.1 mg/dL — ABNORMAL LOW (ref 8.9–10.3)
Chloride: 109 mmol/L (ref 98–111)
Creatinine, Ser: 3.42 mg/dL — ABNORMAL HIGH (ref 0.44–1.00)
GFR, Estimated: 16 mL/min — ABNORMAL LOW (ref >60)
Glucose, Bld: 216 mg/dL — ABNORMAL HIGH (ref 70–99)
Potassium: 4.6 mmol/L (ref 3.5–5.1)
Sodium: 137 mmol/L (ref 135–145)
Total Bilirubin: 0.3 mg/dL (ref 0.3–1.2)
Total Protein: 4.8 g/dL — ABNORMAL LOW (ref 6.5–8.1)

## 2022-04-08 LAB — GLUCOSE, CAPILLARY
Glucose-Capillary: 153 mg/dL — ABNORMAL HIGH (ref 70–99)
Glucose-Capillary: 166 mg/dL — ABNORMAL HIGH (ref 70–99)
Glucose-Capillary: 195 mg/dL — ABNORMAL HIGH (ref 70–99)
Glucose-Capillary: 198 mg/dL — ABNORMAL HIGH (ref 70–99)
Glucose-Capillary: 204 mg/dL — ABNORMAL HIGH (ref 70–99)
Glucose-Capillary: 236 mg/dL — ABNORMAL HIGH (ref 70–99)

## 2022-04-08 LAB — MAGNESIUM: Magnesium: 2.1 mg/dL (ref 1.7–2.4)

## 2022-04-08 LAB — FERRITIN: Ferritin: 108 ng/mL (ref 11–307)

## 2022-04-08 LAB — UREA NITROGEN, URINE: Urea Nitrogen, Ur: 411 mg/dL

## 2022-04-08 MED ORDER — LACTATED RINGERS IV SOLN: INTRAVENOUS | Status: AC

## 2022-04-08 MED ORDER — LORAZEPAM 2 MG/ML IJ SOLN
1.0000 mg | Freq: Once | INTRAMUSCULAR | Status: AC | PRN
  Administered 2022-04-08: 1 mg via INTRAVENOUS
  Filled 2022-04-08: qty 1

## 2022-04-08 MED ORDER — INSULIN ASPART 100 UNIT/ML IJ SOLN
0.0000 [IU] | Freq: Three times a day (TID) | INTRAMUSCULAR | Status: DC
  Administered 2022-04-08 (×2): 2 [IU] via SUBCUTANEOUS
  Administered 2022-04-09: 3 [IU] via SUBCUTANEOUS
  Administered 2022-04-10: 7 [IU] via SUBCUTANEOUS
  Administered 2022-04-10: 2 [IU] via SUBCUTANEOUS
  Administered 2022-04-10 – 2022-04-11 (×3): 3 [IU] via SUBCUTANEOUS

## 2022-04-08 NOTE — Consult Note (Signed)
[facility] Renal Consultation Note  Requesting MD: Lala Lund, MD Indication for Consultation:  AKI   Chief complaint: witnessed collapse  HPI:  [name] is a 51 y.o. female with a history of CKD stage, type 1 DM, CHF, and anxiety who presented to the hospital with after she collapsed at a local [employer].  An RN nearby started CPR and EMS arrived shortly thereafter.  AED placed and she received shock.  Per charting 6-7 minutes of CPR.  Recent admission at the end of August with hematemesis; EGD with esophagitis and active bleed at that time.  She had another recent admission with AKI 8/21-8/24.  Diuretics were held after her 9/2 torsemide dose.  Nephrology is consulted for assistance with management.  She has been given fluids.  Strict ins/outs are not available. She had one urine void yesterday and one charted today thus far.  She has been on fluids today and denies any difficulty with her breathing.  She states torsemide currently off because "sometimes I lose too much water".  She states that she follows with Dr. Candiss Norse at [facility].     Creatinine, Ser  Date/Time Value Ref Range Status  04/08/2022 12:08 AM 3.42 (H) 0.44 - 1.00 mg/dL Final  04/07/2022 12:00 AM 3.26 (H) 0.44 - 1.00 mg/dL Final    Comment:    DELTA CHECK NOTED  04/06/2022 12:05 AM 2.21 (H) 0.44 - 1.00 mg/dL Final  04/05/2022 02:06 PM 2.26 (H) 0.44 - 1.00 mg/dL Final  04/03/2022 04:11 AM 2.34 (H) 0.44 - 1.00 mg/dL Final  04/02/2022 04:14 AM 2.28 (H) 0.44 - 1.00 mg/dL Final  04/01/2022 07:00 PM 1.95 (H) 0.44 - 1.00 mg/dL Final  03/28/2022 04:26 AM 2.13 (H) 0.44 - 1.00 mg/dL Final  03/27/2022 04:29 AM 2.45 (H) 0.44 - 1.00 mg/dL Final  03/26/2022 04:11 AM 2.48 (H) 0.44 - 1.00 mg/dL Final  03/25/2022 09:00 AM 2.13 (H) 0.44 - 1.00 mg/dL Final  11/14/2021 12:07 PM 1.76 (H) 0.57 - 1.00 mg/dL Final  09/10/2021 09:23 AM 1.43 (H) 0.44 - 1.00 mg/dL Final  09/10/2021 07:01 AM 1.66 (H) 0.44 -  1.00 mg/dL Final  08/30/2020 04:04 PM 0.87 0.40 - 1.20 mg/dL Final  08/26/2020 12:47 AM 1.18 (H) 0.44 - 1.00 mg/dL Final  08/25/2020 01:41 AM 0.97 0.44 - 1.00 mg/dL Final  08/24/2020 11:41 AM 0.74 0.44 - 1.00 mg/dL Final  08/23/2020 05:02 PM 1.00 0.44 - 1.00 mg/dL Final  08/16/2020 06:02 AM 1.38 (H) 0.44 - 1.00 mg/dL Final  08/15/2020 12:08 PM 1.38 (H) 0.44 - 1.00 mg/dL Final  08/14/2020 07:28 AM 1.18 (H) 0.44 - 1.00 mg/dL Final  08/12/2020 06:48 AM 1.08 (H) 0.44 - 1.00 mg/dL Final  08/10/2020 08:00 AM 1.20 (H) 0.44 - 1.00 mg/dL Final  08/09/2020 06:17 AM 1.22 (H) 0.44 - 1.00 mg/dL Final  08/09/2020 01:48 AM 1.25 (H) 0.44 - 1.00 mg/dL Final  08/08/2020 03:35 PM 0.93 0.44 - 1.00 mg/dL Final  08/07/2020 04:11 PM 1.03 (H) 0.44 - 1.00 mg/dL Final  06/12/2020 11:48 AM 0.81 0.44 - 1.00 mg/dL Final  03/27/2020 11:36 AM 0.66 0.40 - 1.20 mg/dL Final  01/31/2020 10:08 AM 0.79 0.57 - 1.00 mg/dL Final  01/14/2020 09:58 AM 0.77 0.57 - 1.00 mg/dL Final  01/06/2020 09:54 AM 0.79 0.57 - 1.00 mg/dL Final  12/28/2019 10:31 AM 0.75 0.57 - 1.00 mg/dL Final  12/10/2019 11:17 AM 0.78 0.57 - 1.00 mg/dL Final  12/03/2019 09:52 AM 0.72 0.57 - 1.00 mg/dL Final  11/24/2019  09:34 AM 0.79 0.57 - 1.00 mg/dL Final  10/13/2019 04:49 AM 0.60 0.44 - 1.00 mg/dL Final  10/12/2019 08:34 AM 0.54 0.44 - 1.00 mg/dL Final  10/12/2019 12:00 AM 0.56 0.44 - 1.00 mg/dL Final  10/11/2019 03:22 PM 1.13 (H) 0.44 - 1.00 mg/dL Final  04/23/2019 03:54 PM 0.73 0.57 - 1.00 mg/dL Final  04/01/2019 05:28 AM 0.59 0.44 - 1.00 mg/dL Final  03/31/2019 05:45 AM 0.71 0.44 - 1.00 mg/dL Final  03/30/2019 09:46 AM 0.77 0.44 - 1.00 mg/dL Final  03/29/2019 01:02 PM 0.90 0.44 - 1.00 mg/dL Final  03/29/2019 12:57 PM 1.10 (H) 0.44 - 1.00 mg/dL Final  11/25/2017 11:17 AM 0.61 0.44 - 1.00 mg/dL Final  04/12/2014 04:08 PM 0.6 0.4 - 1.2 mg/dL Final  09/08/2013 09:12 AM 0.5 0.4 - 1.2 mg/dL Final  12/02/2012 09:49 AM 0.6 0.4 - 1.2 mg/dL Final      PMHx:   Past Medical History:  Diagnosis Date   Anemia    Anxiety    Arthritis    Asthma    CHF (congestive heart failure) ([place])    Depression    Diabetic ulcer of left foot ([place]) 05/12/2013   Generalized abdominal pain    History of chicken pox    Loss of weight 12/02/2019   Migraines    Mood disorder ([place])    anxiety   Prurigo nodularis    with diabetic dermopathy   Type 1 diabetes, uncontrolled, with neuropathy    [place]   Ulcers of both lower legs ([place]) 02/20/2014    Past Surgical History:  Procedure Laterality Date   BIOPSY  08/11/2020   Procedure: BIOPSY;  Surgeon: Ladene Artist, MD;  Location: [facility];  Service: Endoscopy;;   BIOPSY  04/02/2022   Procedure: BIOPSY;  Surgeon: Jackquline Denmark, MD;  Location: [facility];  Service: Gastroenterology;;   ESOPHAGOGASTRODUODENOSCOPY (EGD) WITH PROPOFOL N/A 08/11/2020   Procedure: ESOPHAGOGASTRODUODENOSCOPY (EGD) WITH PROPOFOL;  Surgeon: Ladene Artist, MD;  Location: [facility];  Service: Endoscopy;  Laterality: N/A;   ESOPHAGOGASTRODUODENOSCOPY (EGD) WITH PROPOFOL N/A 04/02/2022   Procedure: ESOPHAGOGASTRODUODENOSCOPY (EGD) WITH PROPOFOL;  Surgeon: Jackquline Denmark, MD;  Location: [facility];  Service: Gastroenterology;  Laterality: N/A;   treadmill stress test  01/2013   WNL, low risk study    Family Hx:  Family History  Problem Relation Age of Onset   Diabetes Mother        type 2   Hypertension Mother    Thyroid disease Mother    Bipolar disorder Mother    Heart disease Mother    Calcium disorder Mother    Cancer Maternal Grandmother        Breast, stomach   Cancer Paternal Grandmother        stomach, lung (smoker)   Diabetes Paternal Grandmother    Diabetes Paternal Grandfather    Heart failure Sister    Diabetes Sister    Stroke Maternal Aunt    Cancer Maternal Uncle        prostate   CAD Maternal Aunt        stents   Cancer Maternal Aunt 64       ovarian  She denies any history of ESRD or  CKD   Social History:  reports that she has never smoked. She has never used smokeless tobacco. She reports that she does not currently use alcohol. She reports that she does not use drugs.  Allergies:  Allergies  Allergen Reactions   Amoxicillin Itching  Atorvastatin Other (See Comments)    Dizziness    Dulaglutide Nausea And Vomiting   Lantus [Insulin Glargine] Itching and Rash   Miconazole Nitrate Rash    Medications: Prior to Admission medications   Medication Sig Start Date End Date Taking? Authorizing Provider  acetaminophen (TYLENOL) 500 MG tablet Take 500 mg by mouth every 6 (six) hours as needed for moderate pain.   Yes [provider]  albuterol (PROAIR HFA) 108 (90 Base) MCG/ACT inhaler Inhale 2 puffs into the lungs every 6 (six) hours as needed for wheezing or shortness of breath. 07/03/20  Yes [name], PA-C  Carboxymethylcellul-Glycerin (LUBRICATING EYE DROPS OP) Place 1 drop into both eyes daily as needed (dry eyes).   Yes [provider]  carvedilol (COREG) 12.5 MG tablet Take 1 tablet (12.5 mg total) by mouth 2 (two) times daily. 11/28/21  Yes [name], MD  hydrOXYzine (ATARAX) 50 MG tablet Take 50 mg by mouth in the morning, at noon, and at bedtime. 10/01/21  Yes [provider]  NOVOLOG 100 UNIT/ML injection Inject 0-120 Units into the skin daily. 04/06/20  Yes [provider]  pantoprazole (PROTONIX) 40 MG tablet Take 1 tablet (40 mg total) by mouth daily. 04/03/22 06/02/22 Yes Debbe Odea, MD  polyethylene glycol (MIRALAX / GLYCOLAX) 17 g packet Take 17 g by mouth daily as needed for moderate constipation.   Yes [provider]  pregabalin (LYRICA) 50 MG capsule Take 50 mg by mouth 2 (two) times daily as needed (neuropathy). 12/05/21  Yes [provider]  promethazine (PHENERGAN) 12.5 MG tablet Take 1 tablet (12.5 mg total) by mouth every 6 (six) hours as needed for nausea or vomiting. 04/03/22  Yes  Rizwan, Eunice Blase, MD  tobramycin (TOBREX) 0.3 % ophthalmic solution Place 1-2 drops into both eyes See admin instructions. Instill 1-2 drops into both eyes 3 times daily the day before, day of, and the day after monthly eye injections 11/17/20  Yes [provider]  torsemide (DEMADEX) 20 MG tablet Take 20 mg by mouth daily as needed (swelling). 12/25/21  Yes [provider]  TRESIBA FLEXTOUCH 200 UNIT/ML FlexTouch Pen Inject 50 Units into the skin daily as needed (when insulin pump stops working). 11/04/21  Yes [provider]  Continuous Blood Gluc Sensor (FREESTYLE LIBRE 2 SENSOR) MISC CHANGE EVERY 14 DAYS 03/20/20   [provider]  Insulin Disposable Pump (OMNIPOD 10 PACK) MISC by Does not apply route.    [provider]    I have reviewed the patient's current and reported prior to admission medications.  Labs:     Latest Ref Rng & Units 04/08/2022   12:08 AM 04/07/2022   12:00 AM 04/06/2022   12:05 AM  BMP  Glucose 70 - 99 mg/dL 216  306  138   BUN 6 - 20 mg/dL 41  38  24   Creatinine 0.44 - 1.00 mg/dL 3.42  3.26  2.21   Sodium 135 - 145 mmol/L 137  137  140   Potassium 3.5 - 5.1 mmol/L 4.6  4.5  3.7   Chloride 98 - 111 mmol/L 109  109  111   CO2 22 - 32 mmol/L '19  22  21   ' Calcium 8.9 - 10.3 mg/dL 8.1  7.7  8.3     Urinalysis    Component Value Date/Time   COLORURINE YELLOW 03/27/2022 [place] 03/27/2022 1054   LABSPEC 1.009 03/27/2022 1054   PHURINE  7.0 03/27/2022 1054   GLUCOSEU NEGATIVE 03/27/2022 1054   [place] 03/27/2022 1054   [place] 03/27/2022 1054   BILIRUBINUR negative 08/30/2020 [place] 03/27/2022 1054   PROTEINUR >=300 (A) 03/27/2022 1054   UROBILINOGEN 0.2 08/30/2020 1631   UROBILINOGEN 1.0 08/11/2008 1745   NITRITE NEGATIVE 03/27/2022 1054   LEUKOCYTESUR MODERATE (A) 03/27/2022 1054     ROS:  Pertinent items noted in HPI and remainder of comprehensive ROS  otherwise negative.  Physical Exam: Vitals:   04/08/22 0730 04/08/22 1059  BP: (!) 148/81 115/70  Pulse: 85 77  Resp: 17 15  Temp: 98.4 F (36.9 C) 98.5 F (36.9 C)  SpO2: 94% 93%     General:  adult female in bed in NAD; heart pillow at bedside  HEENT: NCAT Eyes: EOMI sclera anicteric Neck: supple trachea midline Heart: S1S2 no rub Lungs: clear and unlabored; on room air  Abdomen: soft/nt/nd Extremities: no edema appreciated; no cyanosis or clubbing  Skin: no rash on extremities exposed Neuro: alert and oriented x 3 provides hx and follows commands; speaks softly  Psych normal mood and affect  Assessment/Plan:  # AKI  - ischemic insults in the setting of arrest (arrest on 9/1).  May have some pre-renal insults as well.  Renal US with some increased echogenicity; no hydro.  - hopeful for plateau and improvement with supportive care - ordered strict ins/outs - diuretics are currently off - she is on fluids - will need to assess stopping tomorrow and sooner if develops any shortness of breath  # CKD stage 3 - Baseline Cr 1.76 (EGFR 35) in April 2023 - Her more recent admission was previously charted as AKI and now compounded by further insult after ischemic event  # VF arrest  - supportive care per cardiology and primary team   # Chronic systolic CHF - caution with IV fluids - will need to reassess stopping tomorrow   # Metabolic acidosis - mild; stopping fluids  - follow for need for bicarb   # Anemia normocytic  - check iron stores  - PRBC's per primary team; no acute need  Claudia Desanctis 04/08/2022, 4:09 PM

## 2022-04-08 NOTE — Progress Notes (Signed)
PROGRESS NOTE                                                                                                                                                                                                             Patient Demographics:    [name], is a 51 y.o. female, DOB - 27-Nov-1970, GBE:[id number]  Outpatient Primary MD for the patient is [name], [place]    LOS - 3  Admit date - 04/05/2022    Chief Complaint  Patient presents with   Loss of Consciousness   Cardiac Arrest       Brief Narrative (HPI from H&P)    51 y.o. female with medical history significant of DM with gastroparesis, stage 4 CKD, and chronic diastolic CHF presenting with syncope vs. Cardiac arrest.  She was last admitted from 8/28-30 for hematemesis and EGD revealed grade A esophagitis with active bleeding at the time of scope.   Prior to that, she was admitted from 8/21-24 for AKI that was thought to be related to gastroparesis.    After her recent discharge she went to [place] to have lunch with standing in the line she passed out, up to not standing by initiated CPR, there was a AED device on hand which suggested a shock and it was given.  She was subsequently brought to the ER.  Post CPR she developed some substernal pain and discomfort.   Subjective:   Patient in bed, appears comfortable, denies any headache, no fever, no chest pain or pressure, no shortness of breath , no abdominal pain but ++ Nausea. No new focal weakness.   Assessment  & Plan :    Cardiac arrest.  She received appropriate CPR along with AED device prompted shock at a retail facility, troponin trend stable and in non-ACS pattern, EKG nonacute however limited repeat echo shows that her EF has dropped from recent 55% a month ago to 40 to 45%.  Cardiology is on board, currently on [place], will add statin for full secondary prevention in the light of her underlying  diabetes mellitus as well.  Further work-up per cardiology.  Question if she now requires AICD with further cardiac work-up including left heart cath.  For now holding aspirin due to her ongoing issues with gastritis nausea and vomiting.  2.  Ongoing nausea vomiting recent diagnosis  of gastroparesis along with esophagitis.  On PPI, Reglan Premeal, seen by GI, nausea worse on 04/08/2022 discussed with GI, head CT and monitor.  3.  AKI on CKD stage IV.  Creatinine seems to have plateaued around 2.2.  Hold diuretics on 04/07/2022, IVF continue, obtain nephrology input, repeat renal ultrasound, check fractional excretion of urea as she was recently on diuretics till yesterday, AKI make left heart cath a little challenging.     4.  Chronic systolic and diastolic CHF EF now 40 to 45%.  Currently compensated.  Cardiology on board.  Continue home dose Demadex and Coreg for now.  5.  Hypokalemia.  Replaced.  6.  Hypertension.  On Coreg and Demadex.  Will add BiDil for better control.    7. DM. type II she wears insulin pump but has taken it off.  Currently on sliding scale.  Will monitor and adjust.  Lab Results  Component Value Date   HGBA1C 6.3 (H) 03/25/2022   CBG (last 3)  Recent Labs    04/08/22 0405 04/08/22 0734 04/08/22 1101  GLUCAP 153* 204* 198*  '      Condition - Fair  Family Communication  :  None present  Code Status :  Full  Consults  :  Cards, GI  PUD Prophylaxis : PPI   Procedures  :     Renal ultrasound.    TTE Limited - Ef 45%      Disposition Plan  :    Status is: Inpatient  DVT Prophylaxis  :    enoxaparin (LOVENOX) injection 30 mg Start: 04/05/22 2200    Lab Results  Component Value Date   PLT 235 04/08/2022    Diet :  Diet Order             Diet Carb Modified Fluid consistency: Thin; Room service appropriate? Yes  Diet effective now                    Inpatient Medications  Scheduled Meds:  carvedilol  12.5 mg Oral BID   docusate  sodium  100 mg Oral BID   enoxaparin (LOVENOX) injection  30 mg Subcutaneous Q24H   feeding supplement (GLUCERNA SHAKE)  237 mL Oral TID BM   hydrOXYzine  50 mg Oral TID   insulin aspart  0-9 Units Subcutaneous TID WC   insulin glargine-yfgn  10 Units Subcutaneous Daily   insulin pump   Subcutaneous TID WC, HS, 0200   isosorbide-hydrALAZINE  1 tablet Oral TID   metoCLOPramide (REGLAN) injection  10 mg Intravenous TID AC & HS   multivitamin with minerals  1 tablet Oral Daily   pantoprazole  40 mg Oral Daily   rosuvastatin  20 mg Oral Daily   sodium chloride flush  3 mL Intravenous Q12H   Continuous Infusions:  lactated ringers 75 mL/hr at 04/08/22 0800   promethazine (PHENERGAN) injection (IM or IVPB) 25 mg (04/08/22 0741)   PRN Meds:.acetaminophen **OR** acetaminophen, albuterol, bisacodyl, hydrALAZINE, oxyCODONE, polyethylene glycol, pregabalin, promethazine (PHENERGAN) injection (IM or IVPB)  Antibiotics  :    Anti-infectives (From admission, onward)    None        Time Spent in minutes  30   Lala Lund M.D on 04/08/2022 at 11:06 AM  To page go to [email or web address]   [facility] -  Office  [phone]  See all Orders from today for further details    Objective:   Vitals:   04/08/22 0028 04/08/22 0408 04/08/22  0730 04/08/22 1059  BP: 130/81 124/74 (!) 148/81 115/70  Pulse:   85 77  Resp: $Remo'15 20 17 15  'PYggf$ Temp: 98 F (36.7 C) 98 F (36.7 C) 98.4 F (36.9 C) 98.5 F (36.9 C)  TempSrc: Oral  Oral Oral  SpO2:   94% 93%    Wt Readings from Last 3 Encounters:  04/03/22 82 kg  03/28/22 83.7 kg  12/14/21 86.8 kg     Intake/Output Summary (Last 24 hours) at 04/08/2022 1106 Last data filed at 04/08/2022 0800 Gross per 24 hour  Intake 1473.32 ml  Output --  Net 1473.32 ml     Physical Exam  Awake Alert, No new F.N deficits, Normal affect [place].AT,PERRAL Supple Neck, No JVD,   Symmetrical Chest wall movement, Good air movement bilaterally, CTAB RRR,No  Gallops, Rubs or new Murmurs,  +ve B.Sounds, Abd Soft, No tenderness,   No Cyanosis, Clubbing or edema      Data Review:    CBC Recent Labs  Lab 04/01/22 1900 04/01/22 2231 04/03/22 0411 04/05/22 1406 04/06/22 0005 04/07/22 0000 04/08/22 0008  WBC 7.0   < > 6.7 8.8 7.8 6.5 6.4  HGB 10.7*   < > 9.5* 10.6* 9.3* 7.8* 7.7*  HCT 32.9*   < > 29.6* 33.5* 28.4* 23.6* 24.0*  PLT 294   < > 236 240 237 200 235  MCV 86.4   < > 89.2 87.5 84.3 84.9 86.3  MCH 28.1   < > 28.6 27.7 27.6 28.1 27.7  MCHC 32.5   < > 32.1 31.6 32.7 33.1 32.1  RDW 14.1   < > 14.6 14.3 14.3 14.3 14.2  LYMPHSABS 0.9  --  2.7 1.5  --  2.3 2.0  MONOABS 0.1  --  0.3 0.4  --  0.5 0.4  EOSABS 0.0  --  0.1 0.1  --  0.2 0.2  BASOSABS 0.0  --  0.0 0.0  --  0.0 0.0   < > = values in this interval not displayed.    Electrolytes Recent Labs  Lab 04/01/22 1900 04/02/22 0414 04/03/22 0411 04/05/22 1406 04/06/22 0005 04/06/22 0615 04/07/22 0000 04/08/22 0008  NA 143   < > 142 140 140  --  137 137  K 3.9   < > 3.8 4.2 3.7  --  4.5 4.6  CL 114*   < > 118* 109 111  --  109 109  CO2 20*   < > 21* 21* 21*  --  22 19*  GLUCOSE 153*   < > 135* 141* 138*  --  306* 216*  BUN 20   < > 22* 25* 24*  --  38* 41*  CREATININE 1.95*   < > 2.34* 2.26* 2.21*  --  3.26* 3.42*  CALCIUM 8.8*   < > 7.8* 8.6* 8.3*  --  7.7* 8.1*  AST 15  --   --  21  --   --  10* 10*  ALT 13  --   --  16  --   --  11 9  ALKPHOS 69  --   --  73  --   --  53 54  BILITOT 0.8  --   --  0.8  --   --  0.5 0.3  ALBUMIN 3.0*  --   --  2.7*  --   --  2.0* 2.1*  MG  --   --  1.9 1.7  --  1.7 2.2 2.1  INR 0.9  --   --   --   --   --   --   --    < > =  values in this interval not displayed.    ------------------------------------------------------------------------------------------------------------------ No results for input(s): "CHOL", "HDL", "LDLCALC", "TRIG", "CHOLHDL", "LDLDIRECT" in the last 72 hours.  Lab Results  Component Value Date   HGBA1C  6.3 (H) 03/25/2022    No results for input(s): "TSH", "T4TOTAL", "T3FREE", "THYROIDAB" in the last 72 hours.  Invalid input(s): "FREET3" ------------------------------------------------------------------------------------------------------------------ ID Labs Recent Labs  Lab 04/03/22 0411 04/05/22 1406 04/06/22 0005 04/07/22 0000 04/08/22 0008  WBC 6.7 8.8 7.8 6.5 6.4  PLT 236 240 237 200 235  CREATININE 2.34* 2.26* 2.21* 3.26* 3.42*   Cardiac Enzymes No results for input(s): "CKMB", "TROPONINI", "MYOGLOBIN" in the last 168 hours.  Invalid input(s): "CK"   Radiology Reports ECHOCARDIOGRAM COMPLETE  Result Date: 04/07/2022    ECHOCARDIOGRAM REPORT   Patient Name:   AIYA KEACH Date of Exam: 04/07/2022 Medical Rec #:  [id number]      Height:       72.0 in Accession #:    [id number]     Weight:       180.7 lb Date of Birth:  [birth year]     BSA:          2.041 m Patient Age:    79 years       BP:           111/66 mmHg Patient Gender: F              HR:           76 bpm. Exam Location:  Inpatient Procedure: 2D Echo, Cardiac Doppler and Color Doppler Indications:    I46.9 Cardiac Arrest  History:        Patient has prior history of Echocardiogram examinations, most                 recent 04/05/2022. CHF; Risk Factors:Hypertension and Diabetes.  Sonographer:    Alvino Chapel RCS Referring Phys: [phone] [place]  1. Left ventricular ejection fraction, by estimation, is 50 to 55%. The left ventricle has low normal function. The left ventricle has no regional wall motion abnormalities. There is mild left ventricular hypertrophy. Left ventricular diastolic parameters are consistent with Grade II diastolic dysfunction (pseudonormalization).  2. Right ventricular systolic function is normal. The right ventricular size is normal. There is mildly elevated pulmonary artery systolic pressure.  3. Left atrial size was severely dilated.  4. The mitral valve is normal in structure. No evidence  of mitral valve regurgitation. No evidence of mitral stenosis.  5. The aortic valve is normal in structure. Aortic valve regurgitation is not visualized. No aortic stenosis is present.  6. The inferior vena cava is dilated in size with >50% respiratory variability, suggesting right atrial pressure of 8 mmHg. Comparison(s): Prior images reviewed side by side. The left ventricular function has improved. FINDINGS  Left Ventricle: Left ventricular ejection fraction, by estimation, is 50 to 55%. The left ventricle has low normal function. The left ventricle has no regional wall motion abnormalities. The left ventricular internal cavity size was normal in size. There is mild left ventricular hypertrophy. Left ventricular diastolic parameters are consistent with Grade II diastolic dysfunction (pseudonormalization). Right Ventricle: The right ventricular size is normal. No increase in right ventricular wall thickness. Right ventricular systolic function is normal. There is mildly elevated pulmonary artery systolic pressure. The tricuspid regurgitant velocity is 2.37  m/s, and with an assumed right atrial pressure of 15 mmHg, the estimated right ventricular systolic pressure is 97.6 mmHg. Left Atrium: Left  atrial size was severely dilated. Right Atrium: Right atrial size was normal in size. Pericardium: There is no evidence of pericardial effusion. Mitral Valve: The mitral valve is normal in structure. No evidence of mitral valve regurgitation. No evidence of mitral valve stenosis. Tricuspid Valve: The tricuspid valve is normal in structure. Tricuspid valve regurgitation is trivial. No evidence of tricuspid stenosis. Aortic Valve: The aortic valve is normal in structure. Aortic valve regurgitation is not visualized. No aortic stenosis is present. Pulmonic Valve: The pulmonic valve was normal in structure. Pulmonic valve regurgitation is trivial. No evidence of pulmonic stenosis. Aorta: The aortic root is normal in size and  structure. Venous: The inferior vena cava is dilated in size with greater than 50% respiratory variability, suggesting right atrial pressure of 8 mmHg. IAS/Shunts: No atrial level shunt detected by color flow Doppler.  LEFT VENTRICLE PLAX 2D LVIDd:         5.10 cm   Diastology LVIDs:         3.10 cm   LV e' medial:    6.85 cm/s LV PW:         1.20 cm   LV E/e' medial:  12.8 LV IVS:        0.90 cm   LV e' lateral:   8.05 cm/s LVOT diam:     2.00 cm   LV E/e' lateral: 10.9 LV SV:         68 LV SV Index:   34 LVOT Area:     3.14 cm  RIGHT VENTRICLE RV S prime:     13.80 cm/s TAPSE (M-mode): 2.8 cm LEFT ATRIUM              Index        RIGHT ATRIUM           Index LA diam:        4.80 cm  2.35 cm/m   RA Area:     18.20 cm LA Vol (A2C):   108.0 ml 52.92 ml/m  RA Volume:   46.30 ml  22.69 ml/m LA Vol (A4C):   108.0 ml 52.92 ml/m LA Biplane Vol: 107.0 ml 52.43 ml/m  AORTIC VALVE LVOT Vmax:   113.00 cm/s LVOT Vmean:  78.800 cm/s LVOT VTI:    0.218 m  AORTA Ao Root diam: 3.00 cm MITRAL VALVE               TRICUSPID VALVE MV Area (PHT): 4.49 cm    TR Peak grad:   22.5 mmHg MV Decel Time: 169 msec    TR Vmax:        237.00 cm/s MV E velocity: 87.40 cm/s MV A velocity: 34.60 cm/s  SHUNTS MV E/A ratio:  2.53        Systemic VTI:  0.22 m                            Systemic Diam: 2.00 cm Candee Furbish MD Electronically signed by Candee Furbish MD Signature Date/Time: 04/07/2022/2:13:57 PM    Final    US RENAL  Result Date: 04/07/2022 CLINICAL DATA:  Acute kidney injury. EXAM: RENAL / URINARY TRACT ULTRASOUND COMPLETE COMPARISON:  Renal ultrasound, 03/27/2022. FINDINGS: Right Kidney: Renal measurements: 12.5 x 5.4 x 5.9 cm = volume: 209 mL. Borderline increased parenchymal echogenicity. Small upper pole cyst, 1.5 cm. No other masses, no stones and no hydronephrosis. Left Kidney: Renal measurements: 11.7 x 6.0 x 5.5 cm =  volume: 200 mL. Echogenicity within normal limits. No mass or hydronephrosis visualized. Bladder: Appears  normal for degree of bladder distention. Other: None. IMPRESSION: 1. No acute findings. 2. Small upper pole right renal cyst. No other abnormalities. Upper pole cyst not visualized on the prior ultrasound. Electronically Signed   By: Lajean Manes M.D.   On: 04/07/2022 11:54   DG Chest Port 1 View  Result Date: 04/07/2022 CLINICAL DATA:  51 year old female with shortness of breath. EXAM: PORTABLE CHEST 1 VIEW COMPARISON:  Portable chest 04/05/2022 and earlier. FINDINGS: Portable AP semi upright view at 0643 hours. Stable lung volumes and mediastinal contours with cardiomegaly. Regressed pulmonary vascular congestion. Mildly improved ventilation. Residual streaky opacity at the left lung base. No pneumothorax or definite pleural effusion is evident. Visualized tracheal air column is within normal limits. No osseous abnormality identified. Paucity of bowel gas in the upper abdomen. IMPRESSION: 1. Regressed pulmonary interstitial edema and improved ventilation since yesterday. 2. Residual streaky opacity at the left lung base is nonspecific, favor atelectasis. Electronically Signed   By: Genevie Ann M.D.   On: 04/07/2022 08:41   DG Chest Portable 1 View  Result Date: 04/05/2022 CLINICAL DATA:  Syncopal episode EXAM: PORTABLE CHEST 1 VIEW COMPARISON:  None Available. FINDINGS: Enlarged cardiac silhouette. There is perihilar venous congestion. LEFT hemidiaphragm is partially obscured. Upper lungs clear. IMPRESSION: Cardiomegaly and venous congestion. Potential pleural fluid on the LEFT. Findings suggest volume overload. Electronically Signed   By: Suzy Bouchard M.D.   On: 04/05/2022 14:09

## 2022-04-08 NOTE — Consult Note (Signed)
Cardiology Consultation   Patient ID: [name] MRN: [id number]; DOB: 01-08-1971  Admit date: 04/05/2022 Date of Consult: 04/08/2022  PCP:  [name], [place] [facility] Cardiologist:  Sinclair Grooms, MD   {     Patient Profile:   [name] is a 51 y.o. female with a hx of chronic systolic heart failure who is being seen 04/08/2022 for the evaluation of VF arrest at the request of Dr. Marlou Porch.  History of Present Illness:   Ms. [name] is a very pleasant middle aged woman with a non-ischemic CM, chronic systolic heart failure, who was in her usual state of health when she collapsed at [place]. Bystander fireman performed CPR and a AED was placed, advised shock, and she was defibrillated and awoke immediately. The patient had no warning, and has not felt any worse, or better. She had not change her meds except she had tried jardiance. She has no residual encephalopathy after her arrest. Her echo demonstrates essentially preserved LV function.    Past Medical History:  Diagnosis Date   Anemia    Anxiety    Arthritis    Asthma    CHF (congestive heart failure) ([place])    Depression    Diabetic ulcer of left foot ([place]) 05/12/2013   Generalized abdominal pain    History of chicken pox    Loss of weight 12/02/2019   Migraines    Mood disorder ([place])    anxiety   Prurigo nodularis    with diabetic dermopathy   Type 1 diabetes, uncontrolled, with neuropathy    [place]   Ulcers of both lower legs ([place]) 02/20/2014    Past Surgical History:  Procedure Laterality Date   BIOPSY  08/11/2020   Procedure: BIOPSY;  Surgeon: Ladene Artist, MD;  Location: [facility];  Service: Endoscopy;;   BIOPSY  04/02/2022   Procedure: BIOPSY;  Surgeon: Jackquline Denmark, MD;  Location: [facility];  Service: Gastroenterology;;   ESOPHAGOGASTRODUODENOSCOPY (EGD) WITH PROPOFOL N/A 08/11/2020   Procedure: ESOPHAGOGASTRODUODENOSCOPY (EGD) WITH PROPOFOL;  Surgeon: Ladene Artist, MD;  Location: [facility];  Service: Endoscopy;  Laterality: N/A;   ESOPHAGOGASTRODUODENOSCOPY (EGD) WITH PROPOFOL N/A 04/02/2022   Procedure: ESOPHAGOGASTRODUODENOSCOPY (EGD) WITH PROPOFOL;  Surgeon: Jackquline Denmark, MD;  Location: [facility];  Service: Gastroenterology;  Laterality: N/A;   treadmill stress test  01/2013   WNL, low risk study     Home Medications:  Prior to Admission medications   Medication Sig Start Date End Date Taking? Authorizing Provider  acetaminophen (TYLENOL) 500 MG tablet Take 500 mg by mouth every 6 (six) hours as needed for moderate pain.   Yes [provider]  albuterol (PROAIR HFA) 108 (90 Base) MCG/ACT inhaler Inhale 2 puffs into the lungs every 6 (six) hours as needed for wheezing or shortness of breath. 07/03/20  Yes [name], PA-C  Carboxymethylcellul-Glycerin (LUBRICATING EYE DROPS OP) Place 1 drop into both eyes daily as needed (dry eyes).   Yes [provider]  carvedilol (COREG) 12.5 MG tablet Take 1 tablet (12.5 mg total) by mouth 2 (two) times daily. 11/28/21  Yes [name], MD  hydrOXYzine (ATARAX) 50 MG tablet Take 50 mg by mouth in the morning, at noon, and at bedtime. 10/01/21  Yes [provider]  NOVOLOG 100 UNIT/ML injection Inject 0-120 Units into the skin daily. 04/06/20  Yes [provider]  pantoprazole (PROTONIX) 40 MG tablet Take 1 tablet (40 mg total)  by mouth daily. 04/03/22 06/02/22 Yes Debbe Odea, MD  polyethylene glycol (MIRALAX / GLYCOLAX) 17 g packet Take 17 g by mouth daily as needed for moderate constipation.   Yes [provider]  pregabalin (LYRICA) 50 MG capsule Take 50 mg by mouth 2 (two) times daily as needed (neuropathy). 12/05/21  Yes [provider]  promethazine (PHENERGAN) 12.5 MG tablet Take 1 tablet (12.5 mg total) by mouth every 6 (six) hours as needed for nausea or vomiting. 04/03/22  Yes Rizwan, Eunice Blase, MD  tobramycin (TOBREX) 0.3 % ophthalmic  solution Place 1-2 drops into both eyes See admin instructions. Instill 1-2 drops into both eyes 3 times daily the day before, day of, and the day after monthly eye injections 11/17/20  Yes [provider]  torsemide (DEMADEX) 20 MG tablet Take 20 mg by mouth daily as needed (swelling). 12/25/21  Yes [provider]  TRESIBA FLEXTOUCH 200 UNIT/ML FlexTouch Pen Inject 50 Units into the skin daily as needed (when insulin pump stops working). 11/04/21  Yes [provider]  Continuous Blood Gluc Sensor (FREESTYLE LIBRE 2 SENSOR) MISC CHANGE EVERY 14 DAYS 03/20/20   [provider]  Insulin Disposable Pump (OMNIPOD 10 PACK) MISC by Does not apply route.    [provider]    Inpatient Medications: Scheduled Meds:  carvedilol  12.5 mg Oral BID   docusate sodium  100 mg Oral BID   enoxaparin (LOVENOX) injection  30 mg Subcutaneous Q24H   feeding supplement (GLUCERNA SHAKE)  237 mL Oral TID BM   hydrOXYzine  50 mg Oral TID   insulin aspart  0-9 Units Subcutaneous TID WC   insulin glargine-yfgn  10 Units Subcutaneous Daily   insulin pump   Subcutaneous TID WC, HS, 0200   isosorbide-hydrALAZINE  1 tablet Oral TID   metoCLOPramide (REGLAN) injection  10 mg Intravenous TID AC & HS   multivitamin with minerals  1 tablet Oral Daily   pantoprazole  40 mg Oral Daily   rosuvastatin  20 mg Oral Daily   sodium chloride flush  3 mL Intravenous Q12H   Continuous Infusions:  lactated ringers 75 mL/hr at 04/08/22 0800   promethazine (PHENERGAN) injection (IM or IVPB) 25 mg (04/08/22 0741)   PRN Meds: acetaminophen **OR** acetaminophen, albuterol, bisacodyl, hydrALAZINE, oxyCODONE, polyethylene glycol, pregabalin, promethazine (PHENERGAN) injection (IM or IVPB)  Allergies:    Allergies  Allergen Reactions   Amoxicillin Itching   Atorvastatin Other (See Comments)    Dizziness    Dulaglutide Nausea And Vomiting   Lantus [Insulin Glargine] Itching and Rash    Miconazole Nitrate Rash    Social History:   Social History   Socioeconomic History   Marital status: Divorced    Spouse name: Not on file   Number of children: 2   Years of education: Not on file   Highest education level: Not on file  Occupational History   Occupation: Freight forwarder  Tobacco Use   Smoking status: Never   Smokeless tobacco: Never  Vaping Use   Vaping Use: Never used  Substance and Sexual Activity   Alcohol use: Not Currently   Drug use: No   Sexual activity: Yes    Partners: Male    Birth control/protection: None  Other Topics Concern   Not on file  Social History Narrative   Caffeine use: daily   Occupation: cleans houses   Regular exercise: yes, active 5x/wk   Diet: good water, fruits/vegetables daily   Social Determinants of Health  Financial Resource Strain: Not on file  Food Insecurity: Not on file  Transportation Needs: Not on file  Physical Activity: Not on file  Stress: Not on file  Social Connections: Not on file  Intimate Partner Violence: Not on file    Family History:    Family History  Problem Relation Age of Onset   Diabetes Mother        type 2   Hypertension Mother    Thyroid disease Mother    Bipolar disorder Mother    Heart disease Mother    Calcium disorder Mother    Cancer Maternal Grandmother        Breast, stomach   Cancer Paternal Grandmother        stomach, lung (smoker)   Diabetes Paternal Grandmother    Diabetes Paternal Grandfather    Heart failure Sister    Diabetes Sister    Stroke Maternal Aunt    Cancer Maternal Uncle        prostate   CAD Maternal Aunt        stents   Cancer Maternal Aunt 64       ovarian     ROS:  Please see the history of present illness.   All other ROS reviewed and negative.     Physical Exam/Data:   Vitals:   04/07/22 2120 04/08/22 0028 04/08/22 0408 04/08/22 0730  BP:  130/81 124/74 (!) 148/81  Pulse:    85  Resp: (!) $RemoveB'22 15 20 17  'YPdRfpfp$ Temp:  98 F (36.7 C) 98 F (36.7  C) 98.4 F (36.9 C)  TempSrc:  Oral  Oral  SpO2:    94%    Intake/Output Summary (Last 24 hours) at 04/08/2022 0911 Last data filed at 04/08/2022 0800 Gross per 24 hour  Intake 1473.32 ml  Output --  Net 1473.32 ml      04/03/2022   11:52 AM 04/02/2022    1:36 PM 04/01/2022    6:20 PM  Last 3 Weights  Weight (lbs) 180 lb 11.2 oz 184 lb 8.4 oz 184 lb 9.6 oz  Weight (kg) 81.965 kg 83.7 kg 83.734 kg     There is no height or weight on file to calculate BMI.  General:  Well nourished, well developed, in no acute distress HEENT: normal Neck: no JVD Vascular: No carotid bruits; Distal pulses 2+ bilaterally Cardiac:  normal S1, S2; RRR; no murmur  Lungs:  clear to auscultation bilaterally, no wheezing, rhonchi or rales  Abd: soft, nontender, no hepatomegaly  Ext: no edema Musculoskeletal:  No deformities, BUE and BLE strength normal and equal Skin: warm and dry  Neuro:  CNs 2-12 intact, no focal abnormalities noted Psych:  Normal affect   EKG:  The EKG was personally reviewed and demonstrates:  nsr Telemetry:  Telemetry was personally reviewed and demonstrates:  nsr  Relevant CV Studies: See echo results  Laboratory Data:  High Sensitivity Troponin:   Recent Labs  Lab 04/05/22 1406 04/05/22 1534 04/05/22 1911 04/05/22 2029 04/06/22 0615  TROPONINIHS 38* 78* 155* 139* 7     Chemistry Recent Labs  Lab 04/06/22 0005 04/06/22 0615 04/07/22 0000 04/08/22 0008  NA 140  --  137 137  K 3.7  --  4.5 4.6  CL 111  --  109 109  CO2 21*  --  22 19*  GLUCOSE 138*  --  306* 216*  BUN 24*  --  38* 41*  CREATININE 2.21*  --  3.26* 3.42*  CALCIUM  8.3*  --  7.7* 8.1*  MG  --  1.7 2.2 2.1  GFRNONAA 26*  --  17* 16*  ANIONGAP 8  --  6 9    Recent Labs  Lab 04/05/22 1406 04/07/22 0000 04/08/22 0008  PROT 6.2* 4.7* 4.8*  ALBUMIN 2.7* 2.0* 2.1*  AST 21 10* 10*  ALT $Re'16 11 9  'qkP$ ALKPHOS 73 53 54  BILITOT 0.8 0.5 0.3   Lipids No results for input(s): "CHOL", "TRIG", "HDL",  "LABVLDL", "LDLCALC", "CHOLHDL" in the last 168 hours.  Hematology Recent Labs  Lab 04/06/22 0005 04/07/22 0000 04/08/22 0008  WBC 7.8 6.5 6.4  RBC 3.37* 2.78* 2.78*  HGB 9.3* 7.8* 7.7*  HCT 28.4* 23.6* 24.0*  MCV 84.3 84.9 86.3  MCH 27.6 28.1 27.7  MCHC 32.7 33.1 32.1  RDW 14.3 14.3 14.2  PLT 237 200 235   Thyroid No results for input(s): "TSH", "FREET4" in the last 168 hours.  BNPNo results for input(s): "BNP", "PROBNP" in the last 168 hours.  DDimer No results for input(s): "DDIMER" in the last 168 hours.   Radiology/Studies:  ECHOCARDIOGRAM COMPLETE  Result Date: 04/07/2022    ECHOCARDIOGRAM REPORT   Patient Name:   SALVATORE POE Date of Exam: 04/07/2022 Medical Rec #:  [id number]      Height:       72.0 in Accession #:    [id number]     Weight:       180.7 lb Date of Birth:  1970/12/10     BSA:          2.041 m Patient Age:    62 years       BP:           111/66 mmHg Patient Gender: F              HR:           76 bpm. Exam Location:  Inpatient Procedure: 2D Echo, Cardiac Doppler and Color Doppler Indications:    I46.9 Cardiac Arrest  History:        Patient has prior history of Echocardiogram examinations, most                 recent 04/05/2022. CHF; Risk Factors:Hypertension and Diabetes.  Sonographer:    Alvino Chapel RCS Referring Phys: [phone] [place]  1. Left ventricular ejection fraction, by estimation, is 50 to 55%. The left ventricle has low normal function. The left ventricle has no regional wall motion abnormalities. There is mild left ventricular hypertrophy. Left ventricular diastolic parameters are consistent with Grade II diastolic dysfunction (pseudonormalization).  2. Right ventricular systolic function is normal. The right ventricular size is normal. There is mildly elevated pulmonary artery systolic pressure.  3. Left atrial size was severely dilated.  4. The mitral valve is normal in structure. No evidence of mitral valve regurgitation. No evidence of  mitral stenosis.  5. The aortic valve is normal in structure. Aortic valve regurgitation is not visualized. No aortic stenosis is present.  6. The inferior vena cava is dilated in size with >50% respiratory variability, suggesting right atrial pressure of 8 mmHg. Comparison(s): Prior images reviewed side by side. The left ventricular function has improved. FINDINGS  Left Ventricle: Left ventricular ejection fraction, by estimation, is 50 to 55%. The left ventricle has low normal function. The left ventricle has no regional wall motion abnormalities. The left ventricular internal cavity size was normal in size. There is mild left ventricular hypertrophy. Left  ventricular diastolic parameters are consistent with Grade II diastolic dysfunction (pseudonormalization). Right Ventricle: The right ventricular size is normal. No increase in right ventricular wall thickness. Right ventricular systolic function is normal. There is mildly elevated pulmonary artery systolic pressure. The tricuspid regurgitant velocity is 2.37  m/s, and with an assumed right atrial pressure of 15 mmHg, the estimated right ventricular systolic pressure is 93.2 mmHg. Left Atrium: Left atrial size was severely dilated. Right Atrium: Right atrial size was normal in size. Pericardium: There is no evidence of pericardial effusion. Mitral Valve: The mitral valve is normal in structure. No evidence of mitral valve regurgitation. No evidence of mitral valve stenosis. Tricuspid Valve: The tricuspid valve is normal in structure. Tricuspid valve regurgitation is trivial. No evidence of tricuspid stenosis. Aortic Valve: The aortic valve is normal in structure. Aortic valve regurgitation is not visualized. No aortic stenosis is present. Pulmonic Valve: The pulmonic valve was normal in structure. Pulmonic valve regurgitation is trivial. No evidence of pulmonic stenosis. Aorta: The aortic root is normal in size and structure. Venous: The inferior vena cava is  dilated in size with greater than 50% respiratory variability, suggesting right atrial pressure of 8 mmHg. IAS/Shunts: No atrial level shunt detected by color flow Doppler.  LEFT VENTRICLE PLAX 2D LVIDd:         5.10 cm   Diastology LVIDs:         3.10 cm   LV e' medial:    6.85 cm/s LV PW:         1.20 cm   LV E/e' medial:  12.8 LV IVS:        0.90 cm   LV e' lateral:   8.05 cm/s LVOT diam:     2.00 cm   LV E/e' lateral: 10.9 LV SV:         68 LV SV Index:   34 LVOT Area:     3.14 cm  RIGHT VENTRICLE RV S prime:     13.80 cm/s TAPSE (M-mode): 2.8 cm LEFT ATRIUM              Index        RIGHT ATRIUM           Index LA diam:        4.80 cm  2.35 cm/m   RA Area:     18.20 cm LA Vol (A2C):   108.0 ml 52.92 ml/m  RA Volume:   46.30 ml  22.69 ml/m LA Vol (A4C):   108.0 ml 52.92 ml/m LA Biplane Vol: 107.0 ml 52.43 ml/m  AORTIC VALVE LVOT Vmax:   113.00 cm/s LVOT Vmean:  78.800 cm/s LVOT VTI:    0.218 m  AORTA Ao Root diam: 3.00 cm MITRAL VALVE               TRICUSPID VALVE MV Area (PHT): 4.49 cm    TR Peak grad:   22.5 mmHg MV Decel Time: 169 msec    TR Vmax:        237.00 cm/s MV E velocity: 87.40 cm/s MV A velocity: 34.60 cm/s  SHUNTS MV E/A ratio:  2.53        Systemic VTI:  0.22 m                            Systemic Diam: 2.00 cm Candee Furbish MD Electronically signed by Candee Furbish MD Signature Date/Time: 04/07/2022/2:13:57 PM    Final  US RENAL  Result Date: 04/07/2022 CLINICAL DATA:  Acute kidney injury. EXAM: RENAL / URINARY TRACT ULTRASOUND COMPLETE COMPARISON:  Renal ultrasound, 03/27/2022. FINDINGS: Right Kidney: Renal measurements: 12.5 x 5.4 x 5.9 cm = volume: 209 mL. Borderline increased parenchymal echogenicity. Small upper pole cyst, 1.5 cm. No other masses, no stones and no hydronephrosis. Left Kidney: Renal measurements: 11.7 x 6.0 x 5.5 cm = volume: 200 mL. Echogenicity within normal limits. No mass or hydronephrosis visualized. Bladder: Appears normal for degree of bladder distention. Other:  None. IMPRESSION: 1. No acute findings. 2. Small upper pole right renal cyst. No other abnormalities. Upper pole cyst not visualized on the prior ultrasound. Electronically Signed   By: Lajean Manes M.D.   On: 04/07/2022 11:54   DG Chest Port 1 View  Result Date: 04/07/2022 CLINICAL DATA:  51 year old female with shortness of breath. EXAM: PORTABLE CHEST 1 VIEW COMPARISON:  Portable chest 04/05/2022 and earlier. FINDINGS: Portable AP semi upright view at 0643 hours. Stable lung volumes and mediastinal contours with cardiomegaly. Regressed pulmonary vascular congestion. Mildly improved ventilation. Residual streaky opacity at the left lung base. No pneumothorax or definite pleural effusion is evident. Visualized tracheal air column is within normal limits. No osseous abnormality identified. Paucity of bowel gas in the upper abdomen. IMPRESSION: 1. Regressed pulmonary interstitial edema and improved ventilation since yesterday. 2. Residual streaky opacity at the left lung base is nonspecific, favor atelectasis. Electronically Signed   By: Genevie Ann M.D.   On: 04/07/2022 08:41   DG Chest Portable 1 View  Result Date: 04/05/2022 CLINICAL DATA:  Syncopal episode EXAM: PORTABLE CHEST 1 VIEW COMPARISON:  None Available. FINDINGS: Enlarged cardiac silhouette. There is perihilar venous congestion. LEFT hemidiaphragm is partially obscured. Upper lungs clear. IMPRESSION: Cardiomegaly and venous congestion. Potential pleural fluid on the LEFT. Findings suggest volume overload. Electronically Signed   By: Suzy Bouchard M.D.   On: 04/05/2022 14:09     Assessment and Plan:   VF arrest - she is doing well with no recurrent VT/VF. She will undergo ICD insertion as the schedule allows. Chronic systolic heart failure - she will continue medical therapy, limited by stage 4 renal failure.  Chronic renal failure - her creatinine is up. Unclear if pre-renal. Avoid nephrotoxic meds.   Risk Assessment/Risk Scores:         For questions or updates, please contact [place] Please consult [email or web address] for contact info under    Signed, Cristopher Peru, MD  04/08/2022 9:11 AM

## 2022-04-08 NOTE — Plan of Care (Signed)

## 2022-04-08 NOTE — H&P (View-Only) (Signed)
Electrophysiology Rounding Note  Patient Name: [name] Date of Encounter: 04/09/2022  Primary Cardiologist: Sinclair Grooms, MD Electrophysiologist: New   Subjective   NAEO. IV did come out. Worried about glucose being NPO.     Inpatient Medications    Scheduled Meds:  carvedilol  12.5 mg Oral BID   docusate sodium  100 mg Oral BID   feeding supplement (GLUCERNA SHAKE)  237 mL Oral TID BM   hydrOXYzine  50 mg Oral TID   insulin aspart  0-9 Units Subcutaneous TID WC   insulin glargine-yfgn  10 Units Subcutaneous Daily   isosorbide-hydrALAZINE  1 tablet Oral TID   metoCLOPramide (REGLAN) injection  10 mg Intravenous TID AC & HS   multivitamin with minerals  1 tablet Oral Daily   pantoprazole  40 mg Oral Daily   rosuvastatin  20 mg Oral Daily   sodium chloride flush  3 mL Intravenous Q12H   Continuous Infusions:  ferric gluconate (FERRLECIT) IVPB     promethazine (PHENERGAN) injection (IM or IVPB) 25 mg (04/08/22 0741)   PRN Meds: acetaminophen **OR** acetaminophen, albuterol, bisacodyl, hydrALAZINE, oxyCODONE, polyethylene glycol, pregabalin, promethazine (PHENERGAN) injection (IM or IVPB)   Vital Signs    Vitals:   04/08/22 1928 04/08/22 2326 04/09/22 0402 04/09/22 0725  BP: (!) 142/76 132/72 138/78 (!) 141/79  Pulse: 90 89 93   Resp: $Remo'18 20 18 18  'xHcoO$ Temp: 98.4 F (36.9 C) 98.6 F (37 C) 98.1 F (36.7 C) 98 F (36.7 C)  TempSrc: Oral Oral Oral Oral  SpO2: 95% 92% 93% 96%    Intake/Output Summary (Last 24 hours) at 04/09/2022 0741 Last data filed at 04/09/2022 0400 Gross per 24 hour  Intake 1393.18 ml  Output --  Net 1393.18 ml   There were no vitals filed for this visit.  Physical Exam    GEN- The patient is well appearing, alert and oriented x 3 today.   Head- normocephalic, atraumatic Eyes-  Sclera clear, conjunctiva pink Ears- hearing intact Oropharynx- clear Neck- supple Lungs- Clear to ausculation bilaterally, normal work of  breathing Heart- Regular rate and rhythm, no murmurs, rubs or gallops GI- soft, NT, ND, + BS Extremities- no clubbing or cyanosis. No edema Skin- no rash or lesion Psych- euthymic mood, full affect Neuro- strength and sensation are intact  Labs    CBC Recent Labs    04/08/22 0008 04/09/22 0022  WBC 6.4 6.6  NEUTROABS 3.9 4.0  HGB 7.7* 7.8*  HCT 24.0* 23.4*  MCV 86.3 85.1  PLT 235 244   Basic Metabolic Panel Recent Labs    04/08/22 0008 04/09/22 0022  NA 137 140  K 4.6 4.2  CL 109 111  CO2 19* 22  GLUCOSE 216* 228*  BUN 41* 36*  CREATININE 3.42* 2.94*  CALCIUM 8.1* 8.1*  MG 2.1 2.0   Liver Function Tests Recent Labs    04/08/22 0008 04/09/22 0022  AST 10* 10*  ALT 9 8  ALKPHOS 54 56  BILITOT 0.3 0.6  PROT 4.8* 4.7*  ALBUMIN 2.1* 2.0*   No results for input(s): "LIPASE", "AMYLASE" in the last 72 hours. Cardiac Enzymes No results for input(s): "CKTOTAL", "CKMB", "CKMBINDEX", "TROPONINI" in the last 72 hours.   Telemetry    NSR 80-90s (personally reviewed)  Radiology    CT HEAD WO CONTRAST (5MM)  Result Date: 04/08/2022 CLINICAL DATA:  Headache. EXAM: CT HEAD WITHOUT CONTRAST TECHNIQUE: Contiguous axial images were obtained from the base of the skull through  the vertex without intravenous contrast. RADIATION DOSE REDUCTION: This exam was performed according to the departmental dose-optimization program which includes automated exposure control, adjustment of the mA and/or kV according to patient size and/or use of iterative reconstruction technique. COMPARISON:  None Available. FINDINGS: Brain: There is no evidence of an acute infarct, intracranial hemorrhage, mass, midline shift, or extra-axial fluid collection. The ventricles and sulci are normal. Vascular: No hyperdense vessel. Skull: No fracture or suspicious osseous lesion. Sinuses/Orbits: No significant acute inflammatory changes in the included portions of the paranasal sinuses. Wall thickening of the  included portion of the right maxillary sinus suggesting a history of chronic sinusitis. Clear mastoid air cells. Unremarkable included orbits. Other: None. IMPRESSION: Negative head CT. Electronically Signed   By: Logan Bores M.D.   On: 04/08/2022 15:24   ECHOCARDIOGRAM COMPLETE  Result Date: 04/07/2022    ECHOCARDIOGRAM REPORT   Patient Name:   [name] Date of Exam: 04/07/2022 Medical Rec #:  [id number]      Height:       72.0 in Accession #:    [id number]     Weight:       180.7 lb Date of Birth:  1971-02-20     BSA:          2.041 m Patient Age:    51 years       BP:           111/66 mmHg Patient Gender: F              HR:           76 bpm. Exam Location:  Inpatient Procedure: 2D Echo, Cardiac Doppler and Color Doppler Indications:    I46.9 Cardiac Arrest  History:        Patient has prior history of Echocardiogram examinations, most                 recent 04/05/2022. CHF; Risk Factors:Hypertension and Diabetes.  Sonographer:    Alvino Chapel RCS Referring Phys: [phone] [place]  1. Left ventricular ejection fraction, by estimation, is 50 to 55%. The left ventricle has low normal function. The left ventricle has no regional wall motion abnormalities. There is mild left ventricular hypertrophy. Left ventricular diastolic parameters are consistent with Grade II diastolic dysfunction (pseudonormalization).  2. Right ventricular systolic function is normal. The right ventricular size is normal. There is mildly elevated pulmonary artery systolic pressure.  3. Left atrial size was severely dilated.  4. The mitral valve is normal in structure. No evidence of mitral valve regurgitation. No evidence of mitral stenosis.  5. The aortic valve is normal in structure. Aortic valve regurgitation is not visualized. No aortic stenosis is present.  6. The inferior vena cava is dilated in size with >50% respiratory variability, suggesting right atrial pressure of 8 mmHg. Comparison(s): Prior images reviewed  side by side. The left ventricular function has improved. FINDINGS  Left Ventricle: Left ventricular ejection fraction, by estimation, is 50 to 55%. The left ventricle has low normal function. The left ventricle has no regional wall motion abnormalities. The left ventricular internal cavity size was normal in size. There is mild left ventricular hypertrophy. Left ventricular diastolic parameters are consistent with Grade II diastolic dysfunction (pseudonormalization). Right Ventricle: The right ventricular size is normal. No increase in right ventricular wall thickness. Right ventricular systolic function is normal. There is mildly elevated pulmonary artery systolic pressure. The tricuspid regurgitant velocity is 2.37  m/s, and with an  assumed right atrial pressure of 15 mmHg, the estimated right ventricular systolic pressure is 77.9 mmHg. Left Atrium: Left atrial size was severely dilated. Right Atrium: Right atrial size was normal in size. Pericardium: There is no evidence of pericardial effusion. Mitral Valve: The mitral valve is normal in structure. No evidence of mitral valve regurgitation. No evidence of mitral valve stenosis. Tricuspid Valve: The tricuspid valve is normal in structure. Tricuspid valve regurgitation is trivial. No evidence of tricuspid stenosis. Aortic Valve: The aortic valve is normal in structure. Aortic valve regurgitation is not visualized. No aortic stenosis is present. Pulmonic Valve: The pulmonic valve was normal in structure. Pulmonic valve regurgitation is trivial. No evidence of pulmonic stenosis. Aorta: The aortic root is normal in size and structure. Venous: The inferior vena cava is dilated in size with greater than 50% respiratory variability, suggesting right atrial pressure of 8 mmHg. IAS/Shunts: No atrial level shunt detected by color flow Doppler.  LEFT VENTRICLE PLAX 2D LVIDd:         5.10 cm   Diastology LVIDs:         3.10 cm   LV e' medial:    6.85 cm/s LV PW:          1.20 cm   LV E/e' medial:  12.8 LV IVS:        0.90 cm   LV e' lateral:   8.05 cm/s LVOT diam:     2.00 cm   LV E/e' lateral: 10.9 LV SV:         68 LV SV Index:   34 LVOT Area:     3.14 cm  RIGHT VENTRICLE RV S prime:     13.80 cm/s TAPSE (M-mode): 2.8 cm LEFT ATRIUM              Index        RIGHT ATRIUM           Index LA diam:        4.80 cm  2.35 cm/m   RA Area:     18.20 cm LA Vol (A2C):   108.0 ml 52.92 ml/m  RA Volume:   46.30 ml  22.69 ml/m LA Vol (A4C):   108.0 ml 52.92 ml/m LA Biplane Vol: 107.0 ml 52.43 ml/m  AORTIC VALVE LVOT Vmax:   113.00 cm/s LVOT Vmean:  78.800 cm/s LVOT VTI:    0.218 m  AORTA Ao Root diam: 3.00 cm MITRAL VALVE               TRICUSPID VALVE MV Area (PHT): 4.49 cm    TR Peak grad:   22.5 mmHg MV Decel Time: 169 msec    TR Vmax:        237.00 cm/s MV E velocity: 87.40 cm/s MV A velocity: 34.60 cm/s  SHUNTS MV E/A ratio:  2.53        Systemic VTI:  0.22 m                            Systemic Diam: 2.00 cm Candee Furbish MD Electronically signed by Candee Furbish MD Signature Date/Time: 04/07/2022/2:13:57 PM    Final    US RENAL  Result Date: 04/07/2022 CLINICAL DATA:  Acute kidney injury. EXAM: RENAL / URINARY TRACT ULTRASOUND COMPLETE COMPARISON:  Renal ultrasound, 03/27/2022. FINDINGS: Right Kidney: Renal measurements: 12.5 x 5.4 x 5.9 cm = volume: 209 mL. Borderline increased parenchymal echogenicity. Small upper pole cyst, 1.5  cm. No other masses, no stones and no hydronephrosis. Left Kidney: Renal measurements: 11.7 x 6.0 x 5.5 cm = volume: 200 mL. Echogenicity within normal limits. No mass or hydronephrosis visualized. Bladder: Appears normal for degree of bladder distention. Other: None. IMPRESSION: 1. No acute findings. 2. Small upper pole right renal cyst. No other abnormalities. Upper pole cyst not visualized on the prior ultrasound. Electronically Signed   By: Lajean Manes M.D.   On: 04/07/2022 11:54    Patient Profile     51 y.o. female with medical history  significant of DM with gastroparesis, stage 4 CKD, and chronic diastolic CHF presenting with syncope vs. Cardiac arrest.  AED applied at time of syncope recommended shock.  She was last admitted from 8/28-30 for hematemesis and EGD revealed grade A esophagitis with active bleeding at the time of scope.   Prior to that, she was admitted from 8/21-24 for AKI that was thought to be related to gastroparesis.    Assessment & Plan    Resuscitated VF arrest  She is doing well with no recurrent VT/VF.  She Monae Topping undergo ICD insertion pending course and lab availability  Keep K > 4.0 and Mg > 2.0  Explained risks, benefits, and alternatives to ICD implantation, including but not limited to bleeding, infection, pneumothorax, pericardial effusion, lead dislodgement, heart attack, stroke, or death.  Pt verbalized understanding and agrees to proceed. Chronic systolic heart failure - Continue GDMT per primary team.  Limited by stage 4 renal failure. Nephrology following. Not currently candidate for cath, but CT 01/2020 with Ca score of 0 makes ischemic CMP less likely AKI on CKD III -  Baseline appears 1.8-2.1 Cr 3.42  -> 2.94   Avoid nephrotoxic meds.  Dr. Curt Bears to see. Tentatively for ICD today.   For questions or updates, please contact [place] Please consult [email or web address] for contact info under Cardiology/STEMI.  Signed, [name], PA-C  04/09/2022, 7:41 AM   I have seen and examined this patient with Oda Kilts.  Agree with above, note added to reflect my findings.  Patient currently feeling well.  No chest pain or shortness of breath.  Ready for ICD implant today.  GEN: Well nourished, well developed, in no acute distress  HEENT: normal  Neck: no JVD, carotid bruits, or masses Cardiac: RRR; no murmurs, rubs, or gallops,no edema  Respiratory:  clear to auscultation bilaterally, normal work of breathing GI: soft, nontender, nondistended, + BS MS: no deformity or atrophy   Skin: warm and dry Neuro:  Strength and sensation are intact Psych: euthymic mood, full affect   Cardiac arrest: Due to VT/VF.  No reversible causes been found.  She has not had an ischemic work-up due to her elevated creatinine, but did have a calcium score of 01-year ago.  We Annett Boxwell plan for ICD implant.  Risk and benefits were discussed which include bleeding, tamponade, infection, pneumothorax, lead dislodgment, inappropriate shocks.  She understands these risks and is agreed to the procedure.  Zakaria Sedor M. Tyrene Nader MD 04/09/2022 12:50 PM

## 2022-04-08 NOTE — Progress Notes (Addendum)
Electrophysiology Rounding Note  Patient Name: [name] Date of Encounter: 04/09/2022  Primary Cardiologist: Sinclair Grooms, MD Electrophysiologist: New   Subjective   NAEO. IV did come out. Worried about glucose being NPO.     Inpatient Medications    Scheduled Meds:  carvedilol  12.5 mg Oral BID   docusate sodium  100 mg Oral BID   feeding supplement (GLUCERNA SHAKE)  237 mL Oral TID BM   hydrOXYzine  50 mg Oral TID   insulin aspart  0-9 Units Subcutaneous TID WC   insulin glargine-yfgn  10 Units Subcutaneous Daily   isosorbide-hydrALAZINE  1 tablet Oral TID   metoCLOPramide (REGLAN) injection  10 mg Intravenous TID AC & HS   multivitamin with minerals  1 tablet Oral Daily   pantoprazole  40 mg Oral Daily   rosuvastatin  20 mg Oral Daily   sodium chloride flush  3 mL Intravenous Q12H   Continuous Infusions:  ferric gluconate (FERRLECIT) IVPB     promethazine (PHENERGAN) injection (IM or IVPB) 25 mg (04/08/22 0741)   PRN Meds: acetaminophen **OR** acetaminophen, albuterol, bisacodyl, hydrALAZINE, oxyCODONE, polyethylene glycol, pregabalin, promethazine (PHENERGAN) injection (IM or IVPB)   Vital Signs    Vitals:   04/08/22 1928 04/08/22 2326 04/09/22 0402 04/09/22 0725  BP: (!) 142/76 132/72 138/78 (!) 141/79  Pulse: 90 89 93   Resp: $Remo'18 20 18 18  'tBGVs$ Temp: 98.4 F (36.9 C) 98.6 F (37 C) 98.1 F (36.7 C) 98 F (36.7 C)  TempSrc: Oral Oral Oral Oral  SpO2: 95% 92% 93% 96%    Intake/Output Summary (Last 24 hours) at 04/09/2022 0741 Last data filed at 04/09/2022 0400 Gross per 24 hour  Intake 1393.18 ml  Output --  Net 1393.18 ml   There were no vitals filed for this visit.  Physical Exam    GEN- The patient is well appearing, alert and oriented x 3 today.   Head- normocephalic, atraumatic Eyes-  Sclera clear, conjunctiva pink Ears- hearing intact Oropharynx- clear Neck- supple Lungs- Clear to ausculation bilaterally, normal work of  breathing Heart- Regular rate and rhythm, no murmurs, rubs or gallops GI- soft, NT, ND, + BS Extremities- no clubbing or cyanosis. No edema Skin- no rash or lesion Psych- euthymic mood, full affect Neuro- strength and sensation are intact  Labs    CBC Recent Labs    04/08/22 0008 04/09/22 0022  WBC 6.4 6.6  NEUTROABS 3.9 4.0  HGB 7.7* 7.8*  HCT 24.0* 23.4*  MCV 86.3 85.1  PLT 235 570   Basic Metabolic Panel Recent Labs    04/08/22 0008 04/09/22 0022  NA 137 140  K 4.6 4.2  CL 109 111  CO2 19* 22  GLUCOSE 216* 228*  BUN 41* 36*  CREATININE 3.42* 2.94*  CALCIUM 8.1* 8.1*  MG 2.1 2.0   Liver Function Tests Recent Labs    04/08/22 0008 04/09/22 0022  AST 10* 10*  ALT 9 8  ALKPHOS 54 56  BILITOT 0.3 0.6  PROT 4.8* 4.7*  ALBUMIN 2.1* 2.0*   No results for input(s): "LIPASE", "AMYLASE" in the last 72 hours. Cardiac Enzymes No results for input(s): "CKTOTAL", "CKMB", "CKMBINDEX", "TROPONINI" in the last 72 hours.   Telemetry    NSR 80-90s (personally reviewed)  Radiology    CT HEAD WO CONTRAST (5MM)  Result Date: 04/08/2022 CLINICAL DATA:  Headache. EXAM: CT HEAD WITHOUT CONTRAST TECHNIQUE: Contiguous axial images were obtained from the base of the skull through  the vertex without intravenous contrast. RADIATION DOSE REDUCTION: This exam was performed according to the departmental dose-optimization program which includes automated exposure control, adjustment of the mA and/or kV according to patient size and/or use of iterative reconstruction technique. COMPARISON:  None Available. FINDINGS: Brain: There is no evidence of an acute infarct, intracranial hemorrhage, mass, midline shift, or extra-axial fluid collection. The ventricles and sulci are normal. Vascular: No hyperdense vessel. Skull: No fracture or suspicious osseous lesion. Sinuses/Orbits: No significant acute inflammatory changes in the included portions of the paranasal sinuses. Wall thickening of the  included portion of the right maxillary sinus suggesting a history of chronic sinusitis. Clear mastoid air cells. Unremarkable included orbits. Other: None. IMPRESSION: Negative head CT. Electronically Signed   By: Logan Bores M.D.   On: 04/08/2022 15:24   ECHOCARDIOGRAM COMPLETE  Result Date: 04/07/2022    ECHOCARDIOGRAM REPORT   Patient Name:   [name] Date of Exam: 04/07/2022 Medical Rec #:  [id number]      Height:       72.0 in Accession #:    [id number]     Weight:       180.7 lb Date of Birth:  1970-12-24     BSA:          2.041 m Patient Age:    51 years       BP:           111/66 mmHg Patient Gender: F              HR:           76 bpm. Exam Location:  Inpatient Procedure: 2D Echo, Cardiac Doppler and Color Doppler Indications:    I46.9 Cardiac Arrest  History:        Patient has prior history of Echocardiogram examinations, most                 recent 04/05/2022. CHF; Risk Factors:Hypertension and Diabetes.  Sonographer:    Alvino Chapel RCS Referring Phys: [phone] [place]  1. Left ventricular ejection fraction, by estimation, is 50 to 55%. The left ventricle has low normal function. The left ventricle has no regional wall motion abnormalities. There is mild left ventricular hypertrophy. Left ventricular diastolic parameters are consistent with Grade II diastolic dysfunction (pseudonormalization).  2. Right ventricular systolic function is normal. The right ventricular size is normal. There is mildly elevated pulmonary artery systolic pressure.  3. Left atrial size was severely dilated.  4. The mitral valve is normal in structure. No evidence of mitral valve regurgitation. No evidence of mitral stenosis.  5. The aortic valve is normal in structure. Aortic valve regurgitation is not visualized. No aortic stenosis is present.  6. The inferior vena cava is dilated in size with >50% respiratory variability, suggesting right atrial pressure of 8 mmHg. Comparison(s): Prior images reviewed  side by side. The left ventricular function has improved. FINDINGS  Left Ventricle: Left ventricular ejection fraction, by estimation, is 50 to 55%. The left ventricle has low normal function. The left ventricle has no regional wall motion abnormalities. The left ventricular internal cavity size was normal in size. There is mild left ventricular hypertrophy. Left ventricular diastolic parameters are consistent with Grade II diastolic dysfunction (pseudonormalization). Right Ventricle: The right ventricular size is normal. No increase in right ventricular wall thickness. Right ventricular systolic function is normal. There is mildly elevated pulmonary artery systolic pressure. The tricuspid regurgitant velocity is 2.37  m/s, and with an  assumed right atrial pressure of 15 mmHg, the estimated right ventricular systolic pressure is 94.0 mmHg. Left Atrium: Left atrial size was severely dilated. Right Atrium: Right atrial size was normal in size. Pericardium: There is no evidence of pericardial effusion. Mitral Valve: The mitral valve is normal in structure. No evidence of mitral valve regurgitation. No evidence of mitral valve stenosis. Tricuspid Valve: The tricuspid valve is normal in structure. Tricuspid valve regurgitation is trivial. No evidence of tricuspid stenosis. Aortic Valve: The aortic valve is normal in structure. Aortic valve regurgitation is not visualized. No aortic stenosis is present. Pulmonic Valve: The pulmonic valve was normal in structure. Pulmonic valve regurgitation is trivial. No evidence of pulmonic stenosis. Aorta: The aortic root is normal in size and structure. Venous: The inferior vena cava is dilated in size with greater than 50% respiratory variability, suggesting right atrial pressure of 8 mmHg. IAS/Shunts: No atrial level shunt detected by color flow Doppler.  LEFT VENTRICLE PLAX 2D LVIDd:         5.10 cm   Diastology LVIDs:         3.10 cm   LV e' medial:    6.85 cm/s LV PW:          1.20 cm   LV E/e' medial:  12.8 LV IVS:        0.90 cm   LV e' lateral:   8.05 cm/s LVOT diam:     2.00 cm   LV E/e' lateral: 10.9 LV SV:         68 LV SV Index:   34 LVOT Area:     3.14 cm  RIGHT VENTRICLE RV S prime:     13.80 cm/s TAPSE (M-mode): 2.8 cm LEFT ATRIUM              Index        RIGHT ATRIUM           Index LA diam:        4.80 cm  2.35 cm/m   RA Area:     18.20 cm LA Vol (A2C):   108.0 ml 52.92 ml/m  RA Volume:   46.30 ml  22.69 ml/m LA Vol (A4C):   108.0 ml 52.92 ml/m LA Biplane Vol: 107.0 ml 52.43 ml/m  AORTIC VALVE LVOT Vmax:   113.00 cm/s LVOT Vmean:  78.800 cm/s LVOT VTI:    0.218 m  AORTA Ao Root diam: 3.00 cm MITRAL VALVE               TRICUSPID VALVE MV Area (PHT): 4.49 cm    TR Peak grad:   22.5 mmHg MV Decel Time: 169 msec    TR Vmax:        237.00 cm/s MV E velocity: 87.40 cm/s MV A velocity: 34.60 cm/s  SHUNTS MV E/A ratio:  2.53        Systemic VTI:  0.22 m                            Systemic Diam: 2.00 cm Candee Furbish MD Electronically signed by Candee Furbish MD Signature Date/Time: 04/07/2022/2:13:57 PM    Final    US RENAL  Result Date: 04/07/2022 CLINICAL DATA:  Acute kidney injury. EXAM: RENAL / URINARY TRACT ULTRASOUND COMPLETE COMPARISON:  Renal ultrasound, 03/27/2022. FINDINGS: Right Kidney: Renal measurements: 12.5 x 5.4 x 5.9 cm = volume: 209 mL. Borderline increased parenchymal echogenicity. Small upper pole cyst, 1.5  cm. No other masses, no stones and no hydronephrosis. Left Kidney: Renal measurements: 11.7 x 6.0 x 5.5 cm = volume: 200 mL. Echogenicity within normal limits. No mass or hydronephrosis visualized. Bladder: Appears normal for degree of bladder distention. Other: None. IMPRESSION: 1. No acute findings. 2. Small upper pole right renal cyst. No other abnormalities. Upper pole cyst not visualized on the prior ultrasound. Electronically Signed   By: Lajean Manes M.D.   On: 04/07/2022 11:54    Patient Profile     51 y.o. female with medical history  significant of DM with gastroparesis, stage 4 CKD, and chronic diastolic CHF presenting with syncope vs. Cardiac arrest.  AED applied at time of syncope recommended shock.  She was last admitted from 8/28-30 for hematemesis and EGD revealed grade A esophagitis with active bleeding at the time of scope.   Prior to that, she was admitted from 8/21-24 for AKI that was thought to be related to gastroparesis.    Assessment & Plan    Resuscitated VF arrest  She is doing well with no recurrent VT/VF.  She Lilyanne Mcquown undergo ICD insertion pending course and lab availability  Keep K > 4.0 and Mg > 2.0  Explained risks, benefits, and alternatives to ICD implantation, including but not limited to bleeding, infection, pneumothorax, pericardial effusion, lead dislodgement, heart attack, stroke, or death.  Pt verbalized understanding and agrees to proceed. Chronic systolic heart failure - Continue GDMT per primary team.  Limited by stage 4 renal failure. Nephrology following. Not currently candidate for cath, but CT 01/2020 with Ca score of 0 makes ischemic CMP less likely AKI on CKD III -  Baseline appears 1.8-2.1 Cr 3.42  -> 2.94   Avoid nephrotoxic meds.  Dr. Curt Bears to see. Tentatively for ICD today.   For questions or updates, please contact [place] Please consult [email or web address] for contact info under Cardiology/STEMI.  Signed, [name], PA-C  04/09/2022, 7:41 AM   I have seen and examined this patient with Oda Kilts.  Agree with above, note added to reflect my findings.  Patient currently feeling well.  No chest pain or shortness of breath.  Ready for ICD implant today.  GEN: Well nourished, well developed, in no acute distress  HEENT: normal  Neck: no JVD, carotid bruits, or masses Cardiac: RRR; no murmurs, rubs, or gallops,no edema  Respiratory:  clear to auscultation bilaterally, normal work of breathing GI: soft, nontender, nondistended, + BS MS: no deformity or atrophy   Skin: warm and dry Neuro:  Strength and sensation are intact Psych: euthymic mood, full affect   Cardiac arrest: Due to VT/VF.  No reversible causes been found.  She has not had an ischemic work-up due to her elevated creatinine, but did have a calcium score of 01-year ago.  We Amritpal Shropshire plan for ICD implant.  Risk and benefits were discussed which include bleeding, tamponade, infection, pneumothorax, lead dislodgment, inappropriate shocks.  She understands these risks and is agreed to the procedure.  Naturi Alarid M. Treasa Bradshaw MD 04/09/2022 12:50 PM

## 2022-04-09 ENCOUNTER — Telehealth: Payer: Self-pay

## 2022-04-09 ENCOUNTER — Encounter (HOSPITAL_COMMUNITY)
Admission: EM | Disposition: A | Discharge status: Home / Self Care | Payer: Self-pay | Source: Home / Self Care | Attending: Internal Medicine

## 2022-04-09 DIAGNOSIS — I469 Cardiac arrest, cause unspecified: Secondary | ICD-10-CM | POA: Diagnosis not present

## 2022-04-09 DIAGNOSIS — I4901 Ventricular fibrillation: Secondary | ICD-10-CM

## 2022-04-09 DIAGNOSIS — I472 Ventricular tachycardia, unspecified: Secondary | ICD-10-CM

## 2022-04-09 HISTORY — PX: ICD IMPLANT: EP1208

## 2022-04-09 LAB — GLUCOSE, CAPILLARY
Glucose-Capillary: 233 mg/dL — ABNORMAL HIGH (ref 70–99)
Glucose-Capillary: 220 mg/dL — ABNORMAL HIGH (ref 70–99)
Glucose-Capillary: 222 mg/dL — ABNORMAL HIGH (ref 70–99)
Glucose-Capillary: 230 mg/dL — ABNORMAL HIGH (ref 70–99)
Glucose-Capillary: 117 mg/dL — ABNORMAL HIGH (ref 70–99)
Glucose-Capillary: 100 mg/dL — ABNORMAL HIGH (ref 70–99)
Glucose-Capillary: 198 mg/dL — ABNORMAL HIGH (ref 70–99)

## 2022-04-09 LAB — COMPREHENSIVE METABOLIC PANEL
ALT: 8 U/L (ref 0–44)
AST: 10 U/L — ABNORMAL LOW (ref 15–41)
Albumin: 2 g/dL — ABNORMAL LOW (ref 3.5–5.0)
Alkaline Phosphatase: 56 U/L (ref 38–126)
Anion gap: 7 (ref 5–15)
BUN: 36 mg/dL — ABNORMAL HIGH (ref 6–20)
CO2: 22 mmol/L (ref 22–32)
Calcium: 8.1 mg/dL — ABNORMAL LOW (ref 8.9–10.3)
Chloride: 111 mmol/L (ref 98–111)
Creatinine, Ser: 2.94 mg/dL — ABNORMAL HIGH (ref 0.44–1.00)
GFR, Estimated: 19 mL/min — ABNORMAL LOW (ref >60)
Glucose, Bld: 228 mg/dL — ABNORMAL HIGH (ref 70–99)
Potassium: 4.2 mmol/L (ref 3.5–5.1)
Sodium: 140 mmol/L (ref 135–145)
Total Bilirubin: 0.6 mg/dL (ref 0.3–1.2)
Total Protein: 4.7 g/dL — ABNORMAL LOW (ref 6.5–8.1)

## 2022-04-09 LAB — CBC WITH DIFFERENTIAL/PLATELET
Abs Immature Granulocytes: 0.02 10*3/uL (ref 0.00–0.07)
Basophils Absolute: 0 10*3/uL (ref 0.0–0.1)
Basophils Relative: 1 %
Eosinophils Absolute: 0.2 10*3/uL (ref 0.0–0.5)
Eosinophils Relative: 3 %
HCT: 23.4 % — ABNORMAL LOW (ref 36.0–46.0)
Hemoglobin: 7.8 g/dL — ABNORMAL LOW (ref 12.0–15.0)
Immature Granulocytes: 0 %
Lymphocytes Relative: 30 %
Lymphs Abs: 2 10*3/uL (ref 0.7–4.0)
MCH: 28.4 pg (ref 26.0–34.0)
MCHC: 33.3 g/dL (ref 30.0–36.0)
MCV: 85.1 fL (ref 80.0–100.0)
Monocytes Absolute: 0.4 10*3/uL (ref 0.1–1.0)
Monocytes Relative: 6 %
Neutro Abs: 4 10*3/uL (ref 1.7–7.7)
Neutrophils Relative %: 60 %
Platelets: 247 10*3/uL (ref 150–400)
RBC: 2.75 MIL/uL — ABNORMAL LOW (ref 3.87–5.11)
RDW: 14.1 % (ref 11.5–15.5)
WBC: 6.6 10*3/uL (ref 4.0–10.5)
nRBC: 0 % (ref 0.0–0.2)

## 2022-04-09 LAB — SURGICAL PCR SCREEN
MRSA, PCR: NEGATIVE
Staphylococcus aureus: NEGATIVE

## 2022-04-09 LAB — MAGNESIUM: Magnesium: 2 mg/dL (ref 1.7–2.4)

## 2022-04-09 SURGERY — ICD IMPLANT

## 2022-04-09 MED ORDER — ONDANSETRON HCL 4 MG/2ML IJ SOLN
4.0000 mg | Freq: Four times a day (QID) | INTRAMUSCULAR | Status: DC | PRN
  Filled 2022-04-09: qty 2

## 2022-04-09 MED ORDER — VANCOMYCIN HCL IN DEXTROSE 1-5 GM/200ML-% IV SOLN
INTRAVENOUS | Status: AC
  Filled 2022-04-09: qty 200

## 2022-04-09 MED ORDER — FENTANYL CITRATE (PF) 100 MCG/2ML IJ SOLN
INTRAMUSCULAR | Status: AC
  Filled 2022-04-09: qty 2

## 2022-04-09 MED ORDER — FENTANYL CITRATE (PF) 100 MCG/2ML IJ SOLN
INTRAMUSCULAR | Status: DC | PRN
  Administered 2022-04-09: 12.5 ug via INTRAVENOUS
  Administered 2022-04-09: 25 ug via INTRAVENOUS

## 2022-04-09 MED ORDER — ACETAMINOPHEN 325 MG PO TABS
325.0000 mg | ORAL_TABLET | ORAL | Status: DC | PRN
  Administered 2022-04-10: 650 mg via ORAL
  Filled 2022-04-09: qty 2

## 2022-04-09 MED ORDER — SODIUM CHLORIDE 0.9 % IV SOLN: INTRAVENOUS | Status: DC

## 2022-04-09 MED ORDER — CHLORHEXIDINE GLUCONATE 4 % EX LIQD
60.0000 mL | Freq: Once | CUTANEOUS | Status: AC
  Administered 2022-04-09: 4 via TOPICAL
  Filled 2022-04-09: qty 60

## 2022-04-09 MED ORDER — VANCOMYCIN HCL IN DEXTROSE 1-5 GM/200ML-% IV SOLN
1000.0000 mg | INTRAVENOUS | Status: AC
  Administered 2022-04-09: 1000 mg via INTRAVENOUS

## 2022-04-09 MED ORDER — MIDAZOLAM HCL 5 MG/5ML IJ SOLN
INTRAMUSCULAR | Status: DC | PRN
  Administered 2022-04-09 (×2): 1 mg via INTRAVENOUS

## 2022-04-09 MED ORDER — LIDOCAINE HCL (PF) 1 % IJ SOLN
INTRAMUSCULAR | Status: AC
  Filled 2022-04-09: qty 60

## 2022-04-09 MED ORDER — SODIUM CHLORIDE 0.9 % IV SOLN
INTRAVENOUS | Status: AC
  Filled 2022-04-09: qty 2

## 2022-04-09 MED ORDER — SODIUM CHLORIDE 0.9 % IV SOLN
80.0000 mg | INTRAVENOUS | Status: AC
  Administered 2022-04-09: 80 mg
  Filled 2022-04-09: qty 2

## 2022-04-09 MED ORDER — VANCOMYCIN HCL IN DEXTROSE 1-5 GM/200ML-% IV SOLN
1000.0000 mg | Freq: Two times a day (BID) | INTRAVENOUS | Status: AC
  Administered 2022-04-10: 1000 mg via INTRAVENOUS
  Filled 2022-04-09: qty 200

## 2022-04-09 MED ORDER — LIDOCAINE HCL (PF) 1 % IJ SOLN
INTRAMUSCULAR | Status: DC | PRN
  Administered 2022-04-09: 60 mL

## 2022-04-09 MED ORDER — CHLORHEXIDINE GLUCONATE 4 % EX LIQD: 60.0000 mL | Freq: Once | CUTANEOUS | Status: DC

## 2022-04-09 MED ORDER — HEPARIN (PORCINE) IN NACL 1000-0.9 UT/500ML-% IV SOLN
INTRAVENOUS | Status: AC
  Filled 2022-04-09: qty 500

## 2022-04-09 MED ORDER — SODIUM CHLORIDE 0.9 % IV SOLN
250.0000 mg | Freq: Every day | INTRAVENOUS | Status: AC
  Administered 2022-04-09: 250 mg via INTRAVENOUS
  Filled 2022-04-09 (×2): qty 20

## 2022-04-09 MED ORDER — MIDAZOLAM HCL 5 MG/5ML IJ SOLN
INTRAMUSCULAR | Status: AC
Start: 2022-04-09 — End: ?
  Filled 2022-04-09: qty 5

## 2022-04-09 MED ORDER — HEPARIN (PORCINE) IN NACL 1000-0.9 UT/500ML-% IV SOLN
INTRAVENOUS | Status: DC | PRN
  Administered 2022-04-09: 500 mL

## 2022-04-09 SURGICAL SUPPLY — 9 items
CABLE SURGICAL S-101-97-12 (CABLE) ×2 IMPLANT
ICD VISIA MRI VR DVFB1D4 (ICD Generator) IMPLANT
KIT MICROPUNCTURE NIT STIFF (SHEATH) IMPLANT
LEAD SPRINT QUAT SEC 6935M-62 (Lead) IMPLANT
PAD DEFIB RADIO PHYSIO CONN (PAD) ×2 IMPLANT
SHEATH 9FR PRELUDE SNAP 13 (SHEATH) IMPLANT
SHEATH PROBE COVER 6X72 (BAG) IMPLANT
TRAY PACEMAKER INSERTION (PACKS) ×2 IMPLANT
VISIA MRI VR DVFB1D4 (ICD Generator) ×1 IMPLANT

## 2022-04-09 NOTE — Telephone Encounter (Signed)
Pt is currently admitted. Patient has been scheduled for a hospital follow up on Wednesday, 05/29/22 at 8:40 am with Dr. Loletha Carrow. Appt information sent to patient via MyChart and a copy has been mailed.

## 2022-04-09 NOTE — Progress Notes (Signed)
Claimed that she feels like her blood sugar is low. CBG_ [id number].

## 2022-04-09 NOTE — Progress Notes (Signed)
Transported to the cath lab by bed awake and alert. 

## 2022-04-09 NOTE — Discharge Instructions (Signed)
After Your ICD (Implantable Cardiac Defibrillator)   You have a [employer] ICD  ACTIVITY Do not lift your arm above shoulder height for 1 week after your procedure. After 7 days, you may progress as below.  You should remove your sling 24 hours after your procedure, unless otherwise instructed by your provider.     Tuesday April 16, 2022  Wednesday April 17, 2022 Thursday April 18, 2022 Friday April 19, 2022   Do not lift, push, pull, or carry anything over 10 pounds with the affected arm until 6 weeks (Tuesday May 21, 2022 ) after your procedure.   You may drive AFTER your wound check, unless you have been told otherwise by your provider.   Ask your healthcare provider when you can go back to work   INCISION/Dressing If you are on a blood thinner such as Coumadin, Xarelto, Eliquis, Plavix, or Pradaxa please confirm with your provider when this should be resumed.   If large square, outer bandage is left in place, this can be removed after 24 hours from your procedure. Do not remove steri-strips or glue as below.   Monitor your defibrillator site for redness, swelling, and drainage. Call the device clinic at [phone] if you experience these symptoms or fever/chills.  If your incision is sealed with Steri-strips or staples, you may shower 7 days after your procedure or when told by your provider. Do not remove the steri-strips or let the shower hit directly on your site. You may wash around your site with soap and water.    If you were discharged in a sling, please do not wear this during the day more than 48 hours after your surgery unless otherwise instructed. This may increase the risk of stiffness and soreness in your shoulder.   Avoid lotions, ointments, or perfumes over your incision until it is well-healed.  You may use a hot tub or a pool AFTER your wound check appointment if the incision is completely closed.  Your ICD is designed to protect you from  life threatening heart rhythms. Because of this, you may receive a shock.   1 shock with no symptoms:  Call the office during business hours. 1 shock with symptoms (chest pain, chest pressure, dizziness, lightheadedness, shortness of breath, overall feeling unwell):  Call 911. If you experience 2 or more shocks in 24 hours:  Call 911. If you receive a shock, you should not drive for 6 months per the [place] DMV IF you receive appropriate therapy from your ICD.   ICD Alerts:  Some alerts are vibratory and others beep. These are NOT emergencies. Please call our office to let us know. If this occurs at night or on weekends, it can wait until the next business day. Send a remote transmission.  If your device is capable of reading fluid status (for heart failure), you will be offered monthly monitoring to review this with you.   DEVICE MANAGEMENT Remote monitoring is used to monitor your ICD from home. This monitoring is scheduled every 91 days by our office. It allows [place] to keep an eye on the functioning of your device to ensure it is working properly. You will routinely see your Electrophysiologist annually (more often if necessary).   You should receive your ID card for your new device in 4-8 weeks. Keep this card with you at all times once received. Consider wearing a medical alert bracelet or necklace.  Your ICD  may be MRI compatible. This will be discussed at your next  office visit/wound check.  You should avoid contact with strong electric or magnetic fields.   Do not use amateur (ham) radio equipment or electric (arc) welding torches. MP3 player headphones with magnets should not be used. Some devices are safe to use if held at least 12 inches (30 cm) from your defibrillator. These include power tools, lawn mowers, and speakers. If you are unsure if something is safe to use, ask your health care provider.  When using your cell phone, hold it to the ear that is on the opposite side from the  defibrillator. Do not leave your cell phone in a pocket over the defibrillator.  You may safely use electric blankets, heating pads, computers, and microwave ovens.  Call the office right away if: You have chest pain. You feel more than one shock. You feel more short of breath than you have felt before. You feel more light-headed than you have felt before. Your incision starts to open up.  This information is not intended to replace advice given to you by your health care provider. Make sure you discuss any questions you have with your health care provider.

## 2022-04-09 NOTE — Progress Notes (Signed)
[facility] Progress Note  Name: [name] MRN: [id number] DOB: Mar 30, 1971  Chief Complaint:  Witnessed arrest  Subjective:  Strict ins/outs are not available.  She had three urine voids over 9/4. She states that her IV came out.     Review of systems:  Denies shortness of breath  Chest discomfort with deep breaths or straining  Denies n/v this am but did have nausea yesterday  -------------- Background on consult:  [name] is a 51 y.o. female with a history of CKD stage, type 1 DM, CHF, and anxiety who presented to the hospital with after she collapsed at a local [employer].  An RN nearby started CPR and EMS arrived shortly thereafter.  AED placed and she received shock.  Per charting 6-7 minutes of CPR.  Recent admission at the end of August with hematemesis; EGD with esophagitis and active bleed at that time.  She had another recent admission with AKI 8/21-8/24.  Diuretics were held after her 9/2 torsemide dose.  Nephrology is consulted for assistance with management.  She has been given fluids.  Strict ins/outs are not available. She had one urine void yesterday and one charted today thus far.  She has been on fluids today and denies any difficulty with her breathing.  She states torsemide currently off because "sometimes I lose too much water".  She states that she follows with Dr. Carolin Sicks at [facility].    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   Intake/Output Summary (Last 24 hours) at 04/09/2022 0721 Last data filed at 04/09/2022 0400 Gross per 24 hour  Intake 1633.18 ml  Output --  Net 1633.18 ml    Vitals:  Vitals:   04/08/22 1721 04/08/22 1928 04/08/22 2326 04/09/22 0402  BP: 139/81 (!) 142/76 132/72 138/78  Pulse: 84 90 89 93  Resp: '19 18 20 18  ' Temp:  98.4 F (36.9 C) 98.6 F (37 C) 98.1 F (36.7 C)  TempSrc:  Oral Oral Oral  SpO2:  95% 92% 93%     Physical  Exam:    General:  adult female in bed in NAD HEENT: NCAT Eyes: EOMI sclera anicteric Neck: supple trachea midline Heart: S1S2 no rub Lungs: clear and unlabored; on room air  Abdomen: soft/nt/nd Extremities: no edema appreciated; no cyanosis or clubbing  Skin: no rash on extremities exposed Neuro: alert and oriented x 3 provides hx and follows commands; speaks softly  Psych normal mood and affect  Medications reviewed   Labs:     Latest Ref Rng & Units 04/09/2022   12:22 AM 04/08/2022   12:08 AM 04/07/2022   12:00 AM  BMP  Glucose 70 - 99 mg/dL 228  216  306   BUN 6 - 20 mg/dL 36  41  38   Creatinine 0.44 - 1.00 mg/dL 2.94  3.42  3.26   Sodium 135 - 145 mmol/L 140  137  137   Potassium 3.5 - 5.1 mmol/L 4.2  4.6  4.5   Chloride 98 - 111 mmol/L 111  109  109   CO2 22 - 32 mmol/L '22  19  22   ' Calcium 8.9 - 10.3 mg/dL 8.1  8.1  7.7      Assessment/Plan:   # AKI  - ischemic insults in the setting of arrest (arrest on 9/1).  May have some pre-renal insults as well.  Renal US with some increased echogenicity; no hydro.  - she has  plateaued; improving with supportive care - re-ordered strict ins/outs - diuretics are currently off - fluids are off    # CKD stage 3 - Baseline Cr 1.76 (EGFR 35) in April 2023 - Her more recent admission was previously charted as AKI and now compounded by further insult after ischemic event - she follows with Dr. Carolin Sicks at [facility]   # VF arrest  - supportive care per cardiology and primary team    # Chronic systolic CHF - caution with IV fluids - these are now off.   - can resume home torsemide daily as needed on discharge    # Metabolic acidosis - mild; stopping fluids    # Anemia normocytic   - iron deficiency - IV iron x 2 doses - PRBC's per primary team; no acute need  Disposition per primary team.  Nausea yesterday - better today but still pretty early am.  I will ensure that when she is discharged she has follow-up with our  office in 2 weeks.  Will continue to follow her while inpatient    Claudia Desanctis, MD 04/09/2022 7:41 AM

## 2022-04-09 NOTE — Progress Notes (Signed)
PROGRESS NOTE                                                                                                                                                                                                             Patient Demographics:    [name], is a 51 y.o. female, DOB - June 05, 1971, KDX:[id number]  Outpatient Primary MD for the patient is [name], [place]    LOS - 4  Admit date - 04/05/2022    Chief Complaint  Patient presents with   Loss of Consciousness   Cardiac Arrest       Brief Narrative (HPI from H&P)    51 y.o. female with medical history significant of DM with gastroparesis, stage 4 CKD, and chronic diastolic CHF presenting with syncope vs. Cardiac arrest.  She was last admitted from 8/28-30 for hematemesis and EGD revealed grade A esophagitis with active bleeding at the time of scope.   Prior to that, she was admitted from 8/21-24 for AKI that was thought to be related to gastroparesis.    After her recent discharge she went to [place] to have lunch with standing in the line she passed out, up to not standing by initiated CPR, there was a AED device on hand which suggested a shock and it was given.  She was subsequently brought to the ER.  Post CPR she developed some substernal pain and discomfort.   Subjective:   Patient in bed, appears comfortable, denies any headache, no fever, no chest pain or pressure, no shortness of breath , no abdominal pain. No new focal weakness.    Assessment  & Plan :    Cardiac arrest.  She received appropriate CPR along with AED device prompted shock at a retail facility, troponin trend stable and in non-ACS pattern, EKG nonacute however limited repeat echo shows that her EF has dropped from recent 55% a month ago to 40 to 45%.  Cardiology is on board, currently on Perry, will add statin for full secondary prevention in the light of her underlying diabetes mellitus  as well.  Seen by cardiology and EP due for AICD placement on 04/09/2022.  Cardiac medications and work-up per cardiology.  Is currently symptom-free.  2.  Ongoing nausea vomiting recent diagnosis of gastroparesis along with esophagitis.  On PPI, Reglan Premeal, seen by GI, nausea  worse on 04/08/2022 discussed with GI, continue supportive care also head CT obtained which was unremarkable.  Symptoms have improved.  Continue to monitor.  3.  AKI on CKD stage IV.  Creatinine seems to have plateaued around 2.2.  Hold diuretics on 04/07/2022, IVF continue, nephrology team on board, stable renal ultrasound, renal function improved with gentle hydration.  Continue to monitor.  4.  Chronic systolic and diastolic CHF EF now 40 to 45%.  Currently compensated.  Cardiology on board.  Continue Coreg, diuretics per cardiology and nephrology.  Monitor.  5.  Hypertension.  On Coreg and Idol combination with better control, Demadex per Nephrology and cardiology.  6. DM. type II she wears insulin pump but has taken it off.  Currently on low-dose Lantus along with sliding scale.  Will monitor and adjust.  Lab Results  Component Value Date   HGBA1C 6.3 (H) 03/25/2022   CBG (last 3)  Recent Labs    04/08/22 2325 04/09/22 0400 04/09/22 0709  GLUCAP 236* 220* 222*  '      Condition - Fair  Family Communication  :  None present  Code Status :  Full  Consults  :  Cards, GI  PUD Prophylaxis : PPI   Procedures  :     Renal ultrasound.    TTE Limited - Ef 45%      Disposition Plan  :    Status is: Inpatient  DVT Prophylaxis  :        Lab Results  Component Value Date   PLT 247 04/09/2022    Diet :  Diet Order             Diet NPO time specified Except for: Sips with Meds  Diet effective now                    Inpatient Medications  Scheduled Meds:  carvedilol  12.5 mg Oral BID   chlorhexidine  60 mL Topical Once   chlorhexidine  60 mL Topical Once   docusate sodium  100 mg  Oral BID   feeding supplement (GLUCERNA SHAKE)  237 mL Oral TID BM   gentamicin (GARAMYCIN) 80 mg in sodium chloride 0.9 % 500 mL irrigation  80 mg Irrigation On Call   hydrOXYzine  50 mg Oral TID   insulin aspart  0-9 Units Subcutaneous TID WC   insulin glargine-yfgn  10 Units Subcutaneous Daily   isosorbide-hydrALAZINE  1 tablet Oral TID   metoCLOPramide (REGLAN) injection  10 mg Intravenous TID AC & HS   multivitamin with minerals  1 tablet Oral Daily   pantoprazole  40 mg Oral Daily   rosuvastatin  20 mg Oral Daily   sodium chloride flush  3 mL Intravenous Q12H   Continuous Infusions:  sodium chloride     sodium chloride     ferric gluconate (FERRLECIT) IVPB     promethazine (PHENERGAN) injection (IM or IVPB) 25 mg (04/08/22 0741)   vancomycin     PRN Meds:.acetaminophen **OR** acetaminophen, albuterol, bisacodyl, hydrALAZINE, oxyCODONE, polyethylene glycol, pregabalin, promethazine (PHENERGAN) injection (IM or IVPB)  Antibiotics  :    Anti-infectives (From admission, onward)    Start     Dose/Rate Route Frequency Ordered Stop   04/09/22 0900  gentamicin (GARAMYCIN) 80 mg in sodium chloride 0.9 % 500 mL irrigation        80 mg Irrigation On call 04/09/22 0806 04/10/22 0900   04/09/22 0900  vancomycin (VANCOCIN) IVPB 1000 mg/200  mL premix        1,000 mg 200 mL/hr over 60 Minutes Intravenous On call 04/09/22 0806 04/10/22 0900        Time Spent in minutes  30   Lala Lund M.D on 04/09/2022 at 8:23 AM  To page go to [email or web address]   [facility] -  Office  [phone]  See all Orders from today for further details    Objective:   Vitals:   04/08/22 2326 04/09/22 0402 04/09/22 0725 04/09/22 0726  BP: 132/72 138/78 (!) 141/79 (!) 141/79  Pulse: 89 93  93  Resp: $Remo'20 18 18 18  'VJsHa$ Temp: 98.6 F (37 C) 98.1 F (36.7 C) 98 F (36.7 C)   TempSrc: Oral Oral Oral   SpO2: 92% 93% 96% 96%    Wt Readings from Last 3 Encounters:  04/03/22 82 kg  03/28/22 83.7  kg  12/14/21 86.8 kg     Intake/Output Summary (Last 24 hours) at 04/09/2022 0823 Last data filed at 04/09/2022 0400 Gross per 24 hour  Intake 1318.18 ml  Output --  Net 1318.18 ml     Physical Exam  Awake Alert, No new F.N deficits, Normal affect [place].AT,PERRAL Supple Neck, No JVD,   Symmetrical Chest wall movement, Good air movement bilaterally, CTAB RRR,No Gallops, Rubs or new Murmurs,  +ve B.Sounds, Abd Soft, No tenderness,   No Cyanosis, Clubbing or edema     Data Review:    CBC Recent Labs  Lab 04/03/22 0411 04/05/22 1406 04/06/22 0005 04/07/22 0000 04/08/22 0008 04/09/22 0022  WBC 6.7 8.8 7.8 6.5 6.4 6.6  HGB 9.5* 10.6* 9.3* 7.8* 7.7* 7.8*  HCT 29.6* 33.5* 28.4* 23.6* 24.0* 23.4*  PLT 236 240 237 200 235 247  MCV 89.2 87.5 84.3 84.9 86.3 85.1  MCH 28.6 27.7 27.6 28.1 27.7 28.4  MCHC 32.1 31.6 32.7 33.1 32.1 33.3  RDW 14.6 14.3 14.3 14.3 14.2 14.1  LYMPHSABS 2.7 1.5  --  2.3 2.0 2.0  MONOABS 0.3 0.4  --  0.5 0.4 0.4  EOSABS 0.1 0.1  --  0.2 0.2 0.2  BASOSABS 0.0 0.0  --  0.0 0.0 0.0    Electrolytes Recent Labs  Lab 04/05/22 1406 04/06/22 0005 04/06/22 0615 04/07/22 0000 04/08/22 0008 04/09/22 0022  NA 140 140  --  137 137 140  K 4.2 3.7  --  4.5 4.6 4.2  CL 109 111  --  109 109 111  CO2 21* 21*  --  22 19* 22  GLUCOSE 141* 138*  --  306* 216* 228*  BUN 25* 24*  --  38* 41* 36*  CREATININE 2.26* 2.21*  --  3.26* 3.42* 2.94*  CALCIUM 8.6* 8.3*  --  7.7* 8.1* 8.1*  AST 21  --   --  10* 10* 10*  ALT 16  --   --  $R'11 9 8  'AN$ ALKPHOS 73  --   --  53 54 56  BILITOT 0.8  --   --  0.5 0.3 0.6  ALBUMIN 2.7*  --   --  2.0* 2.1* 2.0*  MG 1.7  --  1.7 2.2 2.1 2.0    ------------------------------------------------------------------------------------------------------------------ No results for input(s): "CHOL", "HDL", "LDLCALC", "TRIG", "CHOLHDL", "LDLDIRECT" in the last 72 hours.  Lab Results  Component Value Date   HGBA1C 6.3 (H) 03/25/2022    No  results for input(s): "TSH", "T4TOTAL", "T3FREE", "THYROIDAB" in the last 72 hours.  Invalid input(s): "FREET3" ------------------------------------------------------------------------------------------------------------------ ID Labs Recent Labs  Lab  04/05/22 1406 04/06/22 0005 04/07/22 0000 04/08/22 0008 04/09/22 0022  WBC 8.8 7.8 6.5 6.4 6.6  PLT 240 237 200 235 247  CREATININE 2.26* 2.21* 3.26* 3.42* 2.94*   Cardiac Enzymes No results for input(s): "CKMB", "TROPONINI", "MYOGLOBIN" in the last 168 hours.  Invalid input(s): "CK"   Radiology Reports CT HEAD WO CONTRAST (5MM)  Result Date: 04/08/2022 CLINICAL DATA:  Headache. EXAM: CT HEAD WITHOUT CONTRAST TECHNIQUE: Contiguous axial images were obtained from the base of the skull through the vertex without intravenous contrast. RADIATION DOSE REDUCTION: This exam was performed according to the departmental dose-optimization program which includes automated exposure control, adjustment of the mA and/or kV according to patient size and/or use of iterative reconstruction technique. COMPARISON:  None Available. FINDINGS: Brain: There is no evidence of an acute infarct, intracranial hemorrhage, mass, midline shift, or extra-axial fluid collection. The ventricles and sulci are normal. Vascular: No hyperdense vessel. Skull: No fracture or suspicious osseous lesion. Sinuses/Orbits: No significant acute inflammatory changes in the included portions of the paranasal sinuses. Wall thickening of the included portion of the right maxillary sinus suggesting a history of chronic sinusitis. Clear mastoid air cells. Unremarkable included orbits. Other: None. IMPRESSION: Negative head CT. Electronically Signed   By: Logan Bores M.D.   On: 04/08/2022 15:24   ECHOCARDIOGRAM COMPLETE  Result Date: 04/07/2022    ECHOCARDIOGRAM REPORT   Patient Name:   [name] Date of Exam: 04/07/2022 Medical Rec #:  [id number]      Height:       72.0 in Accession #:     [id number]     Weight:       180.7 lb Date of Birth:  May 10, 1971     BSA:          2.041 m Patient Age:    57 years       BP:           111/66 mmHg Patient Gender: F              HR:           76 bpm. Exam Location:  Inpatient Procedure: 2D Echo, Cardiac Doppler and Color Doppler Indications:    I46.9 Cardiac Arrest  History:        Patient has prior history of Echocardiogram examinations, most                 recent 04/05/2022. CHF; Risk Factors:Hypertension and Diabetes.  Sonographer:    Alvino Chapel RCS Referring Phys: [phone] Kelly  1. Left ventricular ejection fraction, by estimation, is 50 to 55%. The left ventricle has low normal function. The left ventricle has no regional wall motion abnormalities. There is mild left ventricular hypertrophy. Left ventricular diastolic parameters are consistent with Grade II diastolic dysfunction (pseudonormalization).  2. Right ventricular systolic function is normal. The right ventricular size is normal. There is mildly elevated pulmonary artery systolic pressure.  3. Left atrial size was severely dilated.  4. The mitral valve is normal in structure. No evidence of mitral valve regurgitation. No evidence of mitral stenosis.  5. The aortic valve is normal in structure. Aortic valve regurgitation is not visualized. No aortic stenosis is present.  6. The inferior vena cava is dilated in size with >50% respiratory variability, suggesting right atrial pressure of 8 mmHg. Comparison(s): Prior images reviewed side by side. The left ventricular function has improved. FINDINGS  Left Ventricle: Left ventricular ejection fraction, by estimation, is 50 to  55%. The left ventricle has low normal function. The left ventricle has no regional wall motion abnormalities. The left ventricular internal cavity size was normal in size. There is mild left ventricular hypertrophy. Left ventricular diastolic parameters are consistent with Grade II diastolic dysfunction  (pseudonormalization). Right Ventricle: The right ventricular size is normal. No increase in right ventricular wall thickness. Right ventricular systolic function is normal. There is mildly elevated pulmonary artery systolic pressure. The tricuspid regurgitant velocity is 2.37  m/s, and with an assumed right atrial pressure of 15 mmHg, the estimated right ventricular systolic pressure is 56.3 mmHg. Left Atrium: Left atrial size was severely dilated. Right Atrium: Right atrial size was normal in size. Pericardium: There is no evidence of pericardial effusion. Mitral Valve: The mitral valve is normal in structure. No evidence of mitral valve regurgitation. No evidence of mitral valve stenosis. Tricuspid Valve: The tricuspid valve is normal in structure. Tricuspid valve regurgitation is trivial. No evidence of tricuspid stenosis. Aortic Valve: The aortic valve is normal in structure. Aortic valve regurgitation is not visualized. No aortic stenosis is present. Pulmonic Valve: The pulmonic valve was normal in structure. Pulmonic valve regurgitation is trivial. No evidence of pulmonic stenosis. Aorta: The aortic root is normal in size and structure. Venous: The inferior vena cava is dilated in size with greater than 50% respiratory variability, suggesting right atrial pressure of 8 mmHg. IAS/Shunts: No atrial level shunt detected by color flow Doppler.  LEFT VENTRICLE PLAX 2D LVIDd:         5.10 cm   Diastology LVIDs:         3.10 cm   LV e' medial:    6.85 cm/s LV PW:         1.20 cm   LV E/e' medial:  12.8 LV IVS:        0.90 cm   LV e' lateral:   8.05 cm/s LVOT diam:     2.00 cm   LV E/e' lateral: 10.9 LV SV:         68 LV SV Index:   34 LVOT Area:     3.14 cm  RIGHT VENTRICLE RV S prime:     13.80 cm/s TAPSE (M-mode): 2.8 cm LEFT ATRIUM              Index        RIGHT ATRIUM           Index LA diam:        4.80 cm  2.35 cm/m   RA Area:     18.20 cm LA Vol (A2C):   108.0 ml 52.92 ml/m  RA Volume:   46.30 ml  22.69  ml/m LA Vol (A4C):   108.0 ml 52.92 ml/m LA Biplane Vol: 107.0 ml 52.43 ml/m  AORTIC VALVE LVOT Vmax:   113.00 cm/s LVOT Vmean:  78.800 cm/s LVOT VTI:    0.218 m  AORTA Ao Root diam: 3.00 cm MITRAL VALVE               TRICUSPID VALVE MV Area (PHT): 4.49 cm    TR Peak grad:   22.5 mmHg MV Decel Time: 169 msec    TR Vmax:        237.00 cm/s MV E velocity: 87.40 cm/s MV A velocity: 34.60 cm/s  SHUNTS MV E/A ratio:  2.53        Systemic VTI:  0.22 m  Systemic Diam: 2.00 cm Candee Furbish MD Electronically signed by Candee Furbish MD Signature Date/Time: 04/07/2022/2:13:57 PM    Final    US RENAL  Result Date: 04/07/2022 CLINICAL DATA:  Acute kidney injury. EXAM: RENAL / URINARY TRACT ULTRASOUND COMPLETE COMPARISON:  Renal ultrasound, 03/27/2022. FINDINGS: Right Kidney: Renal measurements: 12.5 x 5.4 x 5.9 cm = volume: 209 mL. Borderline increased parenchymal echogenicity. Small upper pole cyst, 1.5 cm. No other masses, no stones and no hydronephrosis. Left Kidney: Renal measurements: 11.7 x 6.0 x 5.5 cm = volume: 200 mL. Echogenicity within normal limits. No mass or hydronephrosis visualized. Bladder: Appears normal for degree of bladder distention. Other: None. IMPRESSION: 1. No acute findings. 2. Small upper pole right renal cyst. No other abnormalities. Upper pole cyst not visualized on the prior ultrasound. Electronically Signed   By: Lajean Manes M.D.   On: 04/07/2022 11:54   DG Chest Port 1 View  Result Date: 04/07/2022 CLINICAL DATA:  51 year old female with shortness of breath. EXAM: PORTABLE CHEST 1 VIEW COMPARISON:  Portable chest 04/05/2022 and earlier. FINDINGS: Portable AP semi upright view at 0643 hours. Stable lung volumes and mediastinal contours with cardiomegaly. Regressed pulmonary vascular congestion. Mildly improved ventilation. Residual streaky opacity at the left lung base. No pneumothorax or definite pleural effusion is evident. Visualized tracheal air column is  within normal limits. No osseous abnormality identified. Paucity of bowel gas in the upper abdomen. IMPRESSION: 1. Regressed pulmonary interstitial edema and improved ventilation since yesterday. 2. Residual streaky opacity at the left lung base is nonspecific, favor atelectasis. Electronically Signed   By: Genevie Ann M.D.   On: 04/07/2022 08:41   DG Chest Portable 1 View  Result Date: 04/05/2022 CLINICAL DATA:  Syncopal episode EXAM: PORTABLE CHEST 1 VIEW COMPARISON:  None Available. FINDINGS: Enlarged cardiac silhouette. There is perihilar venous congestion. LEFT hemidiaphragm is partially obscured. Upper lungs clear. IMPRESSION: Cardiomegaly and venous congestion. Potential pleural fluid on the LEFT. Findings suggest volume overload. Electronically Signed   By: Suzy Bouchard M.D.   On: 04/05/2022 14:09

## 2022-04-09 NOTE — Interval H&P Note (Signed)
History and Physical Interval Note:  04/09/2022 12:54 PM  [name]  has presented today for surgery, with the diagnosis of vfib arrest.  The various methods of treatment have been discussed with the patient and family. After consideration of risks, benefits and other options for treatment, the patient has consented to  Procedure(s): ICD IMPLANT (N/A) as a surgical intervention.  The patient's history has been reviewed, patient examined, no change in status, stable for surgery.  I have reviewed the patient's chart and labs.  Questions were answered to the patient's satisfaction.     [name] [name]  ICD Criteria  Current LVEF:60%. Within 12 months prior to implant: Yes   Heart failure history: No  Cardiomyopathy history: No.  Atrial Fibrillation/Atrial Flutter: No.  Ventricular tachycardia history: No.  Cardiac arrest history: Yes, Ventricular Fibrillation.  History of syndromes with risk of sudden death: No.  Previous ICD: No.  Current ICD indication: Secondary  PPM indication: No.  Class I or II Bradycardia indication present: No  Beta Blocker therapy for 3 or more months: No, medical reason.  Ace Inhibitor/ARB therapy for 3 or more months: No, medical reason.   I have seen [name] is a 51 y.o. femalepre-procedural and has been referred by [facility] for consideration of ICD implant for secondary prevention of sudden death.  The patient's chart has been reviewed and they meet criteria for ICD implant.  I have had a thorough discussion with the patient reviewing options.  The patient and their family (if available) have had opportunities to ask questions and have them answered. The patient and I have decided together through the [employer] to implant ICD at this time.  Risks, benefits, alternatives to ICD implantation were discussed in detail with the patient today. The patient  understands that the risks include but are not  limited to bleeding, infection, pneumothorax, perforation, tamponade, vascular damage, renal failure, MI, stroke, death, inappropriate shocks, and lead dislodgement and wishes to proceed.

## 2022-04-09 NOTE — Progress Notes (Signed)
IVF NS stopped in the cath LAB.

## 2022-04-09 NOTE — Telephone Encounter (Signed)
-----   Message from Doran Stabler, MD sent at 04/09/2022  6:24 AM EDT ----- Regarding: RE: Hospital follow up Will do.  Thanks [employer].  - HD ----- Message ----- From: Daryel November, MD Sent: 04/07/2022  12:53 PM EDT To: Doran Stabler, MD; Yevette Edwards, RN Subject: Hospital follow up                             [facility],  Can you set up [name] for a routine outpatient visit with Dr. Loletha Carrow for hospital follow up of nausea/vomiting/esophagitis?  HD,  Patient Type I DM, chronic, but episodic abdominal pain, nausea/vomiting, started on Reglan with great response in terms of pain, n/v and PO tolerance.  Not sure if this is truly gastroparesis given the episodic nature.  If you could help the patient guide Reglan usage and perhaps consider a repeat GES. FYI, patient was admitted this time with syncope (possible cardiac arrest?), back to baseline within hours, plans for outpt EP study

## 2022-04-09 NOTE — Progress Notes (Signed)
Inpatient Diabetes Program Recommendations  AACE/ADA: New Consensus Statement on Inpatient Glycemic Control (2015)  Target Ranges:  Prepandial:   less than 140 mg/dL      Peak postprandial:   less than 180 mg/dL (1-2 hours)      Critically ill patients:  140 - 180 mg/dL   Lab Results  Component Value Date   GLUCAP 230 (H) 04/09/2022   HGBA1C 6.3 (H) 03/25/2022    Review of Glycemic Control  Latest Reference Range & Units 04/08/22 16:53 04/08/22 19:27 04/08/22 23:25 04/09/22 04:00 04/09/22 07:09 04/09/22 11:08  Glucose-Capillary 70 - 99 mg/dL 166 (H) 233 (H) 236 (H) 220 (H) 222 (H) 230 (H)   Diabetes history: Type 1 DM  Outpatient Diabetes medications:  Omnipod (uses approx. 50 units daily),  Tresiba 50 units daily (when off pump) Current orders for Inpatient glycemic control:  Novolog 0-9 units tid with meals Semglee 10 units daily  Inpatient Diabetes Program Recommendations:   Patient is not wearing insulin pump at this time.  Unsure of settings.  She states that she has a reaction to Lantus in the past with itching.  A1C is 6.3% and patient states that he blood sugars are usually in the 80-120 range at home.  She wears a CGM (freestyle Gideon) with her insulin pump.   Consider d/c of Semglee and change to Levemir 8 units bid.  Once eating she will likely need the addition of Novolog meal coverage 2 units tid with meals (hold if patient eats less than 50% or NPO).  Patient does not have her insulin pump at the hospital at this time so please d/c insulin pump orders.    Thanks,  Adah Perl, RN, BC-ADM Inpatient Diabetes Coordinator Pager [phone]  (8a-5p)

## 2022-04-09 NOTE — Progress Notes (Signed)
Back from the cath lab by bed awake and alert. Left arm sling in used. Instructed not to turn to affected site.

## 2022-04-10 ENCOUNTER — Inpatient Hospital Stay (HOSPITAL_COMMUNITY): Payer: BC Managed Care – PPO

## 2022-04-10 ENCOUNTER — Encounter (HOSPITAL_COMMUNITY): Payer: Self-pay | Admitting: Cardiology

## 2022-04-10 DIAGNOSIS — I469 Cardiac arrest, cause unspecified: Secondary | ICD-10-CM | POA: Diagnosis not present

## 2022-04-10 LAB — GLUCOSE, CAPILLARY
Glucose-Capillary: 174 mg/dL — ABNORMAL HIGH (ref 70–99)
Glucose-Capillary: 173 mg/dL — ABNORMAL HIGH (ref 70–99)
Glucose-Capillary: 308 mg/dL — ABNORMAL HIGH (ref 70–99)
Glucose-Capillary: 184 mg/dL — ABNORMAL HIGH (ref 70–99)

## 2022-04-10 LAB — COMPREHENSIVE METABOLIC PANEL
ALT: 10 U/L (ref 0–44)
AST: 11 U/L — ABNORMAL LOW (ref 15–41)
Albumin: 2.2 g/dL — ABNORMAL LOW (ref 3.5–5.0)
Alkaline Phosphatase: 56 U/L (ref 38–126)
Anion gap: 4 — ABNORMAL LOW (ref 5–15)
BUN: 29 mg/dL — ABNORMAL HIGH (ref 6–20)
CO2: 22 mmol/L (ref 22–32)
Calcium: 8.5 mg/dL — ABNORMAL LOW (ref 8.9–10.3)
Chloride: 114 mmol/L — ABNORMAL HIGH (ref 98–111)
Creatinine, Ser: 2.61 mg/dL — ABNORMAL HIGH (ref 0.44–1.00)
GFR, Estimated: 22 mL/min — ABNORMAL LOW (ref >60)
Glucose, Bld: 162 mg/dL — ABNORMAL HIGH (ref 70–99)
Potassium: 4.5 mmol/L (ref 3.5–5.1)
Sodium: 140 mmol/L (ref 135–145)
Total Bilirubin: 0.7 mg/dL (ref 0.3–1.2)
Total Protein: 5.1 g/dL — ABNORMAL LOW (ref 6.5–8.1)

## 2022-04-10 LAB — CBC WITH DIFFERENTIAL/PLATELET
Abs Immature Granulocytes: 0.02 10*3/uL (ref 0.00–0.07)
Basophils Absolute: 0 10*3/uL (ref 0.0–0.1)
Basophils Relative: 0 %
Eosinophils Absolute: 0.1 10*3/uL (ref 0.0–0.5)
Eosinophils Relative: 2 %
HCT: 24.5 % — ABNORMAL LOW (ref 36.0–46.0)
Hemoglobin: 8 g/dL — ABNORMAL LOW (ref 12.0–15.0)
Immature Granulocytes: 0 %
Lymphocytes Relative: 28 %
Lymphs Abs: 1.6 10*3/uL (ref 0.7–4.0)
MCH: 28.1 pg (ref 26.0–34.0)
MCHC: 32.7 g/dL (ref 30.0–36.0)
MCV: 86 fL (ref 80.0–100.0)
Monocytes Absolute: 0.4 10*3/uL (ref 0.1–1.0)
Monocytes Relative: 7 %
Neutro Abs: 3.4 10*3/uL (ref 1.7–7.7)
Neutrophils Relative %: 63 %
Platelets: 242 10*3/uL (ref 150–400)
RBC: 2.85 MIL/uL — ABNORMAL LOW (ref 3.87–5.11)
RDW: 14.1 % (ref 11.5–15.5)
WBC: 5.5 10*3/uL (ref 4.0–10.5)
nRBC: 0 % (ref 0.0–0.2)

## 2022-04-10 LAB — MAGNESIUM: Magnesium: 1.9 mg/dL (ref 1.7–2.4)

## 2022-04-10 MED ORDER — SODIUM CHLORIDE 0.9 % IV SOLN
25.0000 mg | Freq: Once | INTRAVENOUS | Status: AC
  Administered 2022-04-10: 25 mg via INTRAVENOUS
  Filled 2022-04-10: qty 1

## 2022-04-10 MED ORDER — PROMETHAZINE HCL 25 MG/ML IJ SOLN
12.5000 mg | Freq: Four times a day (QID) | INTRAMUSCULAR | Status: DC | PRN
Start: 2022-04-10 — End: 2022-04-11
  Filled 2022-04-10: qty 1

## 2022-04-10 MED ORDER — INSULIN GLARGINE-YFGN 100 UNIT/ML ~~LOC~~ SOLN
12.0000 [IU] | Freq: Every day | SUBCUTANEOUS | Status: DC
  Administered 2022-04-10: 12 [IU] via SUBCUTANEOUS
  Filled 2022-04-10: qty 0.12

## 2022-04-10 MED ORDER — INSULIN DETEMIR 100 UNIT/ML ~~LOC~~ SOLN
12.0000 [IU] | Freq: Every day | SUBCUTANEOUS | Status: DC
Start: 2022-04-11 — End: 2022-04-11
  Filled 2022-04-10: qty 0.12

## 2022-04-10 NOTE — Progress Notes (Signed)
Electrophysiology Rounding Note  Patient Name: [name] Date of Encounter: 04/10/2022  Primary Cardiologist: Sinclair Grooms, MD Electrophysiologist: New to Dr. Myles Gip   Subjective   Having some increased nausea this am. Otherwise feeling OK.   Inpatient Medications    Scheduled Meds:  carvedilol  12.5 mg Oral BID   docusate sodium  100 mg Oral BID   feeding supplement (GLUCERNA SHAKE)  237 mL Oral TID BM   hydrOXYzine  50 mg Oral TID   insulin aspart  0-9 Units Subcutaneous TID WC   insulin glargine-yfgn  10 Units Subcutaneous Daily   isosorbide-hydrALAZINE  1 tablet Oral TID   metoCLOPramide (REGLAN) injection  10 mg Intravenous TID AC & HS   multivitamin with minerals  1 tablet Oral Daily   pantoprazole  40 mg Oral Daily   rosuvastatin  20 mg Oral Daily   sodium chloride flush  3 mL Intravenous Q12H   Continuous Infusions:  ferric gluconate (FERRLECIT) IVPB 250 mg (04/09/22 1140)   promethazine (PHENERGAN) injection (IM or IVPB) 25 mg (04/10/22 0414)   PRN Meds: acetaminophen, albuterol, bisacodyl, hydrALAZINE, ondansetron (ZOFRAN) IV, oxyCODONE, polyethylene glycol, pregabalin, promethazine (PHENERGAN) injection (IM or IVPB)   Vital Signs    Vitals:   04/09/22 2220 04/09/22 2306 04/10/22 0428 04/10/22 0807  BP:  118/69 (!) 140/86 (!) 147/87  Pulse:  83 84 77  Resp: 20 19 (!) 21 16  Temp:  98.6 F (37 C) 98.7 F (37.1 C) 99.1 F (37.3 C)  TempSrc:  Oral Oral Oral  SpO2:  93% 94% 95%    Intake/Output Summary (Last 24 hours) at 04/10/2022 9381 Last data filed at 04/10/2022 8299 Gross per 24 hour  Intake 1476 ml  Output --  Net 1476 ml   There were no vitals filed for this visit.  Physical Exam    GEN- The patient is well appearing, alert and oriented x 3 today.   Head- normocephalic, atraumatic Eyes-  Sclera clear, conjunctiva pink Ears- hearing intact Oropharynx- clear Neck- supple Lungs- Clear to ausculation bilaterally, normal work of  breathing Heart- Regular rate and rhythm, no murmurs, rubs or gallops GI- soft, NT, ND, + BS Extremities- no clubbing or cyanosis. No edema Skin- no rash or lesion Psych- euthymic mood, full affect Neuro- strength and sensation are intact  Labs    CBC Recent Labs    04/09/22 0022 04/10/22 0549  WBC 6.6 5.5  NEUTROABS 4.0 3.4  HGB 7.8* 8.0*  HCT 23.4* 24.5*  MCV 85.1 86.0  PLT 247 371   Basic Metabolic Panel Recent Labs    04/09/22 0022 04/10/22 0549  NA 140 140  K 4.2 4.5  CL 111 114*  CO2 22 22  GLUCOSE 228* 162*  BUN 36* 29*  CREATININE 2.94* 2.61*  CALCIUM 8.1* 8.5*  MG 2.0 1.9   Liver Function Tests Recent Labs    04/09/22 0022 04/10/22 0549  AST 10* 11*  ALT 8 10  ALKPHOS 56 56  BILITOT 0.6 0.7  PROT 4.7* 5.1*  ALBUMIN 2.0* 2.2*   No results for input(s): "LIPASE", "AMYLASE" in the last 72 hours. Cardiac Enzymes No results for input(s): "CKTOTAL", "CKMB", "CKMBINDEX", "TROPONINI" in the last 72 hours.   Telemetry    NSR 80s (personally reviewed)  Radiology    DG Chest 2 View  Result Date: 04/10/2022 CLINICAL DATA:  51 year old female status post cardiac device placement. EXAM: CHEST - 2 VIEW COMPARISON:  04/07/2022 FINDINGS: The mediastinal contours  are within normal limits. Unchanged cardiomegaly. Interval placement of left chest wall, subclavian vein approach single lead pacer AICD with the lead tip projecting over the expected location of the right ventricle. Mild blunting of the costophrenic angles bilaterally with mild bibasilar hazy opacities. Mild cephalization of the pulmonary vasculature. No pneumothorax. No acute osseous abnormality. IMPRESSION: 1. Status post insertion of left subclavian vein approach single lead pacemaker/AICD with leads in expected location. 2. Mild pulmonary edema and associated trace bilateral pleural effusions. 3. Unchanged cardiomegaly. Electronically Signed   By: Ruthann Cancer M.D.   On: 04/10/2022 08:06   EP  PPM/ICD IMPLANT  Result Date: 04/09/2022 SURGEON:  Allegra Lai, MD    PREPROCEDURE DIAGNOSES:  1. Cardiac arrest due to VT/VF     POSTPROCEDURE DIAGNOSES:  1. Cardiac arrest due to VT/VF    PROCEDURES:   1. ICD implantation.  INTRODUCTION:  [name] is a 51 y.o. female with an out of hospital cardiac arrest. No cause has been found. Presents to the lab today for ICD implant    DESCRIPTION OF PROCEDURE:  Informed written consent was obtained and the patient was brought to the electrophysiology lab in the fasting state. The patient was adequately sedated with intravenous Versed, and fentanyl as outlined in the nursing report.  The patient's left chest was prepped and draped in the usual sterile fashion by the EP lab staff.  The skin overlying the left deltopectoral region was infiltrated with lidocaine for local analgesia.  A 5-cm incision was made over the left deltopectoral region.  A left subcutaneous defibrillator pocket was fashioned using a combination of sharp and blunt dissection.  Electrocautery was used to assure hemostasis. RA/RV Lead Placement: The left axillary vein was cannulated with fluoroscopic visualization.  Through the left axillary vein, a Medtronic Sprint Quattro Secure [id number] (serial number [id number]) right ventricular defibrillator lead were advanced with fluoroscopic visualization into the right ventricular apex positions.  Initial right ventricular lead R-wave measured 7.2 mV with impedance of 510 ohms and a threshold of 0.4 volts at 0.5 milliseconds. The leads were secured to the pectoralis  fascia using #2 silk suture over the suture sleeves.  The pocket then  irrigated with copious gentamicin solution.  The leads were then  connected to a Medtronic Visia AF MRI [id number]  (serial  Number [id number]) ICD.  The defibrillator was placed into the  pocket.  The pocket was then closed in 3 layers with 2.0 Vicryl suture for the subcutaneous and 3.0 Vicryl suture subcuticular layers.  EBL<50ml.  Steri-Strips and a  sterile dressing were then applied.  CONCLUSIONS:  1. Cardiac arrest due to VT/VF  2. Successful ICD implantation.  3. No early apparent complications. Megan Meredith Leeds, MD 04/09/2022 4:49 PM   CT HEAD WO CONTRAST (5MM)  Result Date: 04/08/2022 CLINICAL DATA:  Headache. EXAM: CT HEAD WITHOUT CONTRAST TECHNIQUE: Contiguous axial images were obtained from the base of the skull through the vertex without intravenous contrast. RADIATION DOSE REDUCTION: This exam was performed according to the departmental dose-optimization program which includes automated exposure control, adjustment of the mA and/or kV according to patient size and/or use of iterative reconstruction technique. COMPARISON:  None Available. FINDINGS: Brain: There is no evidence of an acute infarct, intracranial hemorrhage, mass, midline shift, or extra-axial fluid collection. The ventricles and sulci are normal. Vascular: No hyperdense vessel. Skull: No fracture or suspicious osseous lesion. Sinuses/Orbits: No significant acute inflammatory changes in the included portions of the paranasal sinuses. Wall thickening of  the included portion of the right maxillary sinus suggesting a history of chronic sinusitis. Clear mastoid air cells. Unremarkable included orbits. Other: None. IMPRESSION: Negative head CT. Electronically Signed   By: Logan Bores M.D.   On: 04/08/2022 15:24    Patient Profile     51 y.o. female with medical history significant of DM with gastroparesis, stage 4 CKD, and chronic diastolic CHF presenting with syncope vs. Cardiac arrest.  AED applied at time of syncope recommended shock.   She was last admitted from 8/28-30 for hematemesis and EGD revealed grade A esophagitis with active bleeding at the time of scope.   Prior to that, she was admitted from 8/21-24 for AKI that was thought to be related to gastroparesis.    Assessment & Plan    Resuscitated VF arrest  S/p MDT Single chamber ICD  04/09/2022 by Dr. Myles Gip.  CXR stable. Formal read pending Interrogation pending.  Wound care and arm restrictions reviewed with pt.  2. Chronic systolic heart failure - Continue GDMT per primary team.  Limited by stage 4 renal failure. Nephrology following. Not currently candidate for cath, but CT 01/2020 with Ca score of 0 makes ischemic CMP less likely AKI on CKD III -  Baseline appears 1.8-2.1 Cr 3.42  -> 2.94   Creatinine, ser  2.61* (09/06 0549) Avoid nephrotoxic meds.  Device site is stable. EP Megan see as needed while remains here. Usual follow up in place.   For questions or updates, please contact [place] Please consult [email or web address] for contact info under Cardiology/STEMI.  Signed, [name], PA-C  04/10/2022, 8:33 AM

## 2022-04-10 NOTE — Plan of Care (Signed)
  Problem: Education: Goal: Knowledge of General Education information will improve Description: Including pain rating scale, medication(s)/side effects and non-pharmacologic comfort measures Outcome: Progressing   Problem: Health Behavior/Discharge Planning: Goal: Ability to manage health-related needs will improve Outcome: Progressing   Problem: Clinical Measurements: Goal: Ability to maintain clinical measurements within normal limits will improve Outcome: Progressing Goal: Will remain free from infection Outcome: Progressing Goal: Diagnostic test results will improve Outcome: Progressing Goal: Respiratory complications will improve Outcome: Progressing Goal: Cardiovascular complication will be avoided Outcome: Progressing   Problem: Activity: Goal: Risk for activity intolerance will decrease Outcome: Progressing   Problem: Nutrition: Goal: Adequate nutrition will be maintained Outcome: Progressing   Problem: Coping: Goal: Level of anxiety will decrease Outcome: Progressing   Problem: Elimination: Goal: Will not experience complications related to bowel motility Outcome: Progressing Goal: Will not experience complications related to urinary retention Outcome: Progressing   Problem: Pain Managment: Goal: General experience of comfort will improve Outcome: Progressing   Problem: Safety: Goal: Ability to remain free from injury will improve Outcome: Progressing   Problem: Skin Integrity: Goal: Risk for impaired skin integrity will decrease Outcome: Progressing   Problem: Education: Goal: Ability to describe self-care measures that may prevent or decrease complications (Diabetes Survival Skills Education) will improve Outcome: Progressing Goal: Individualized Educational Video(s) Outcome: Progressing   Problem: Coping: Goal: Ability to adjust to condition or change in health will improve Outcome: Progressing   Problem: Fluid Volume: Goal: Ability to  maintain a balanced intake and output will improve Outcome: Progressing   Problem: Health Behavior/Discharge Planning: Goal: Ability to identify and utilize available resources and services will improve Outcome: Progressing Goal: Ability to manage health-related needs will improve Outcome: Progressing   Problem: Metabolic: Goal: Ability to maintain appropriate glucose levels will improve Outcome: Progressing   Problem: Nutritional: Goal: Maintenance of adequate nutrition will improve Outcome: Progressing Goal: Progress toward achieving an optimal weight will improve Outcome: Progressing

## 2022-04-10 NOTE — TOC Progression Note (Signed)
Transition of Care [facility]) - Progression Note    Patient Details  Name: Jenise Iannelli MRN: [id number] Date of Birth: Feb 06, 1971  Transition of Care [facility]) CM/SW [place], RN Phone [phone]  04/10/2022, 3:11 PM  Clinical Narrative:     Transition of Care [facility]) Screening Note   Patient Details  Name: Cherith Tewell Date of Birth: 11-02-1970   Transition of [facility]) CM/SW Contact:    Angelita Ingles, RN Phone Number: 04/10/2022, 3:11 PM    Transition of Care Department [facility]) has reviewed patient and no TOC needs have been identified at this time. We will continue to monitor patient advancement through interdisciplinary progression rounds.           Expected Discharge Plan and Services                                                 Social Determinants of Health (SDOH) Interventions    Readmission Risk Interventions     No data to display

## 2022-04-10 NOTE — Progress Notes (Signed)
PROGRESS NOTE    [name]  NOM:[id number] DOB: [birth year] DOA: 04/05/2022 PCP: [name], PA   Brief Narrative:  51 y.o. female with medical history significant of DM with gastroparesis, stage 4 CKD, and chronic diastolic CHF presenting with syncope vs. Cardiac arrest.  She was last admitted from 8/28-30 for hematemesis and EGD revealed grade A esophagitis with active bleeding at the time of scope.   Prior to that, she was admitted from 8/21-24 for AKI that was thought to be related to gastroparesis.     After her recent discharge she went to [place] to have lunch with standing in the line she passed out, up to not standing by initiated CPR, there was a AED device on hand which suggested a shock and it was given.  She was subsequently brought to the ER.  Post CPR she developed some substernal pain and discomfort  Assessment & Plan:   Principal Problem:   Cardiac arrest ([place]) Active Problems:   Diabetic gastroparesis ([place])   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)   Nausea and vomiting   Chronic diastolic CHF (congestive heart failure) ([place])   Stage 3b chronic kidney disease (CKD) ([place])   Essential hypertension  1. Cardiac arrest.  She received appropriate CPR along with AED device prompted shock at a retail facility, troponin trend stable and in non-ACS pattern, EKG nonacute however limited repeat echo shows that her EF has dropped from recent 55% a month ago to 40 to 45%.  Seen by cardiology and EP AND UNDERWENT AICD placement on 04/09/2022.  Cardiac medications and work-up per cardiology.  Is currently symptom-free.   2.  Ongoing nausea vomiting recent diagnosis of gastroparesis along with esophagitis.  On PPI, Reglan Premeal, seen by GI, nausea worse on 04/08/2022 discussed with GI, continue supportive care also head CT obtained which was unremarkable.  Continues to have intermittent symptoms.   3.  AKI on CKD stage IV.  Patient's baseline creatinine appears  to be around 1.7-1.8 in April 2023.  Her creatinine peaked at 3.42 on 04/08/2022, improving now.  Down to 2.6.  Nephrology managing.  4.  Chronic systolic and diastolic CHF EF now 40 to 45%.  Currently compensated.  Cardiology on board.  Continue Coreg, diuretics per cardiology and nephrology.  Monitor.   5.  Hypertension.  On Coreg and Imdur.  Blood pressure very well controlled.  Demadex per Nephrology and cardiology.   6. DM. type II she wears insulin pump but has taken it off.  Currently on low-dose Lantus along with sliding scale.  Slightly hyperglycemic, will increase Semglee from 10 to 12 units and continue SSI.  DVT prophylaxis: SCDs Start: 04/09/22 1713   Code Status: Full Code  Family Communication:  None present at bedside.  Plan of care discussed with patient in length and he/she verbalized understanding and agreed with it.  Status is: Inpatient Remains inpatient appropriate because: Still symptomatic.   Estimated body mass index is 24.51 kg/m as calculated from the following:   Height as of 04/03/22: 6' (1.829 m).   Weight as of 04/03/22: 82 kg.    Nutritional Assessment: There is no height or weight on file to calculate BMI.. Seen by dietician.  I agree with the assessment and plan as outlined below: Nutrition Status: Nutrition Problem: Increased nutrient needs Etiology: chronic illness (CHF) Signs/Symptoms: estimated needs Interventions: Glucerna shake, MVI  . Skin Assessment: I have examined the patient's skin and I agree with the wound assessment as  performed by the wound care RN as outlined below:    Consultants:  Cardiology Nephrology  Procedures:  As above  Antimicrobials:  Anti-infectives (From admission, onward)    Start     Dose/Rate Route Frequency Ordered Stop   04/10/22 0212  vancomycin (VANCOCIN) IVPB 1000 mg/200 mL premix        1,000 mg 200 mL/hr over 60 Minutes Intravenous Every 12 hours 04/09/22 1712 04/10/22 0415   04/09/22 0900   gentamicin (GARAMYCIN) 80 mg in sodium chloride 0.9 % 500 mL irrigation        80 mg Irrigation On call 04/09/22 0806 04/09/22 1633   04/09/22 0900  vancomycin (VANCOCIN) IVPB 1000 mg/200 mL premix        1,000 mg 200 mL/hr over 60 Minutes Intravenous On call 04/09/22 0806 04/09/22 2151         Subjective: Patient seen and examined.  She complains of some nausea but otherwise feels well.  No other complaint.  Objective: Vitals:   04/09/22 2220 04/09/22 2306 04/10/22 0428 04/10/22 0807  BP:  118/69 (!) 140/86 (!) 147/87  Pulse:  83 84 77  Resp: 20 19 (!) 21 16  Temp:  98.6 F (37 C) 98.7 F (37.1 C) 99.1 F (37.3 C)  TempSrc:  Oral Oral Oral  SpO2:  93% 94% 95%    Intake/Output Summary (Last 24 hours) at 04/10/2022 0946 Last data filed at 04/10/2022 5885 Gross per 24 hour  Intake 1476 ml  Output --  Net 1476 ml   There were no vitals filed for this visit.  Examination:  General exam: Appears calm and comfortable  Respiratory system: Clear to auscultation. Respiratory effort normal. Cardiovascular system: S1 & S2 heard, RRR. No JVD, murmurs, rubs, gallops or clicks. No pedal edema. Gastrointestinal system: Abdomen is nondistended, soft and nontender. No organomegaly or masses felt. Normal bowel sounds heard. Central nervous system: Alert and oriented. No focal neurological deficits. Extremities: Symmetric 5 x 5 power. Skin: No rashes, lesions or ulcers Psychiatry: Judgement and insight appear normal. Mood & affect appropriate.    Data Reviewed: I have personally reviewed following labs and imaging studies  CBC: Recent Labs  Lab 04/05/22 1406 04/06/22 0005 04/07/22 0000 04/08/22 0008 04/09/22 0022 04/10/22 0549  WBC 8.8 7.8 6.5 6.4 6.6 5.5  NEUTROABS 6.7  --  3.6 3.9 4.0 3.4  HGB 10.6* 9.3* 7.8* 7.7* 7.8* 8.0*  HCT 33.5* 28.4* 23.6* 24.0* 23.4* 24.5*  MCV 87.5 84.3 84.9 86.3 85.1 86.0  PLT 240 237 200 235 247 027   Basic Metabolic Panel: Recent Labs  Lab  04/06/22 0005 04/06/22 0615 04/07/22 0000 04/08/22 0008 04/09/22 0022 04/10/22 0549  NA 140  --  137 137 140 140  K 3.7  --  4.5 4.6 4.2 4.5  CL 111  --  109 109 111 114*  CO2 21*  --  22 19* 22 22  GLUCOSE 138*  --  306* 216* 228* 162*  BUN 24*  --  38* 41* 36* 29*  CREATININE 2.21*  --  3.26* 3.42* 2.94* 2.61*  CALCIUM 8.3*  --  7.7* 8.1* 8.1* 8.5*  MG  --  1.7 2.2 2.1 2.0 1.9   GFR: Estimated Creatinine Clearance: 29.4 mL/min (A) (by C-G formula based on SCr of 2.61 mg/dL (H)). Liver Function Tests: Recent Labs  Lab 04/05/22 1406 04/07/22 0000 04/08/22 0008 04/09/22 0022 04/10/22 0549  AST 21 10* 10* 10* 11*  ALT $Re'16 11 9 8 'XZn$ 10  ALKPHOS 73 53 54 56 56  BILITOT 0.8 0.5 0.3 0.6 0.7  PROT 6.2* 4.7* 4.8* 4.7* 5.1*  ALBUMIN 2.7* 2.0* 2.1* 2.0* 2.2*   Recent Labs  Lab 04/05/22 1406  LIPASE 24   No results for input(s): "AMMONIA" in the last 168 hours. Coagulation Profile: No results for input(s): "INR", "PROTIME" in the last 168 hours. Cardiac Enzymes: No results for input(s): "CKTOTAL", "CKMB", "CKMBINDEX", "TROPONINI" in the last 168 hours. BNP (last 3 results) No results for input(s): "PROBNP" in the last 8760 hours. HbA1C: No results for input(s): "HGBA1C" in the last 72 hours. CBG: Recent Labs  Lab 04/09/22 1513 04/09/22 1542 04/09/22 2143 04/10/22 0425 04/10/22 0630  GLUCAP 117* 100* 198* 174* 173*   Lipid Profile: No results for input(s): "CHOL", "HDL", "LDLCALC", "TRIG", "CHOLHDL", "LDLDIRECT" in the last 72 hours. Thyroid Function Tests: No results for input(s): "TSH", "T4TOTAL", "FREET4", "T3FREE", "THYROIDAB" in the last 72 hours. Anemia Panel: Recent Labs    04/08/22 1800  FERRITIN 108  TIBC 218*  IRON 32   Sepsis Labs: No results for input(s): "PROCALCITON", "LATICACIDVEN" in the last 168 hours.  Recent Results (from the past 240 hour(s))  Resp Panel by RT-PCR (Flu A&B, Covid) Anterior Nasal Swab     Status: None   Collection Time:  04/05/22  1:43 PM   Specimen: Anterior Nasal Swab  Result Value Ref Range Status   SARS Coronavirus 2 by RT PCR NEGATIVE NEGATIVE Final    Comment: (NOTE) SARS-CoV-2 target nucleic acids are NOT DETECTED.  The SARS-CoV-2 RNA is generally detectable in upper respiratory specimens during the acute phase of infection. The lowest concentration of SARS-CoV-2 viral copies this assay can detect is 138 copies/mL. A negative result does not preclude SARS-Cov-2 infection and should not be used as the sole basis for treatment or other patient management decisions. A negative result may occur with  improper specimen collection/handling, submission of specimen other than nasopharyngeal swab, presence of viral mutation(s) within the areas targeted by this assay, and inadequate number of viral copies(<138 copies/mL). A negative result must be combined with clinical observations, patient history, and epidemiological information. The expected result is Negative.  Fact Sheet for Patients:  EntrepreneurPulse.com.au  Fact Sheet for Healthcare Providers:  IncredibleEmployment.be  This test is no t yet approved or cleared by the [place] FDA and  has been authorized for detection and/or diagnosis of SARS-CoV-2 by FDA under an Emergency Use Authorization (EUA). This EUA will remain  in effect (meaning this test can be used) for the duration of the COVID-19 declaration under Section 564(b)(1) of the Act, 21 U.S.C.section 360bbb-3(b)(1), unless the authorization is terminated  or revoked sooner.       Influenza A by PCR NEGATIVE NEGATIVE Final   Influenza B by PCR NEGATIVE NEGATIVE Final    Comment: (NOTE) The Xpert Xpress SARS-CoV-2/FLU/RSV plus assay is intended as an aid in the diagnosis of influenza from Nasopharyngeal swab specimens and should not be used as a sole basis for treatment. Nasal washings and aspirates are unacceptable for Xpert Xpress  SARS-CoV-2/FLU/RSV testing.  Fact Sheet for Patients: EntrepreneurPulse.com.au  Fact Sheet for Healthcare Providers: IncredibleEmployment.be  This test is not yet approved or cleared by the [place] FDA and has been authorized for detection and/or diagnosis of SARS-CoV-2 by FDA under an Emergency Use Authorization (EUA). This EUA will remain in effect (meaning this test can be used) for the duration of the COVID-19 declaration under Section 564(b)(1) of the Act, 21  U.S.C. section 360bbb-3(b)(1), unless the authorization is terminated or revoked.  Performed at [facility], [place] [address], [place], [place] [address]   Surgical PCR screen     Status: None   Collection Time: 04/09/22  8:06 AM   Specimen: Nasal Mucosa; Nasal Swab  Result Value Ref Range Status   MRSA, PCR NEGATIVE NEGATIVE Final   Staphylococcus aureus NEGATIVE NEGATIVE Final    Comment: (NOTE) The Xpert SA Assay (FDA approved for NASAL specimens in patients 71 years of age and older), is one component of a comprehensive surveillance program. It is not intended to diagnose infection nor to guide or monitor treatment. Performed at [facility], [place] [address] [place], [place] [address]      Radiology Studies: DG Chest 2 View  Result Date: 04/10/2022 CLINICAL DATA:  51 year old female status post cardiac device placement. EXAM: CHEST - 2 VIEW COMPARISON:  04/07/2022 FINDINGS: The mediastinal contours are within normal limits. Unchanged cardiomegaly. Interval placement of left chest wall, subclavian vein approach single lead pacer AICD with the lead tip projecting over the expected location of the right ventricle. Mild blunting of the costophrenic angles bilaterally with mild bibasilar hazy opacities. Mild cephalization of the pulmonary vasculature. No pneumothorax. No acute osseous abnormality. IMPRESSION: 1. Status post insertion of left subclavian vein approach  single lead pacemaker/AICD with leads in expected location. 2. Mild pulmonary edema and associated trace bilateral pleural effusions. 3. Unchanged cardiomegaly. Electronically Signed   By: Ruthann Cancer M.D.   On: 04/10/2022 08:06   EP PPM/ICD IMPLANT  Result Date: 04/09/2022 SURGEON:  Allegra Lai, MD    PREPROCEDURE DIAGNOSES:  1. Cardiac arrest due to VT/VF     POSTPROCEDURE DIAGNOSES:  1. Cardiac arrest due to VT/VF    PROCEDURES:   1. ICD implantation.  INTRODUCTION:  [name] is a 51 y.o. female with an out of hospital cardiac arrest. No cause has been found. Presents to the lab today for ICD implant    DESCRIPTION OF PROCEDURE:  Informed written consent was obtained and the patient was brought to the electrophysiology lab in the fasting state. The patient was adequately sedated with intravenous Versed, and fentanyl as outlined in the nursing report.  The patient's left chest was prepped and draped in the usual sterile fashion by the EP lab staff.  The skin overlying the left deltopectoral region was infiltrated with lidocaine for local analgesia.  A 5-cm incision was made over the left deltopectoral region.  A left subcutaneous defibrillator pocket was fashioned using a combination of sharp and blunt dissection.  Electrocautery was used to assure hemostasis. RA/RV Lead Placement: The left axillary vein was cannulated with fluoroscopic visualization.  Through the left axillary vein, a Medtronic Sprint Quattro Secure [id number] (serial number [id number]) right ventricular defibrillator lead were advanced with fluoroscopic visualization into the right ventricular apex positions.  Initial right ventricular lead R-wave measured 7.2 mV with impedance of 510 ohms and a threshold of 0.4 volts at 0.5 milliseconds. The leads were secured to the pectoralis  fascia using #2 silk suture over the suture sleeves.  The pocket then  irrigated with copious gentamicin solution.  The leads were then  connected to a  Medtronic Visia AF MRI DVFB1D1  (serial  Number [id number]) ICD.  The defibrillator was placed into the  pocket.  The pocket was then closed in 3 layers with 2.0 Vicryl suture for the subcutaneous and 3.0 Vicryl suture subcuticular layers. EBL<75ml.  Steri-Strips and  a  sterile dressing were then applied.  CONCLUSIONS:  1. Cardiac arrest due to VT/VF  2. Successful ICD implantation.  3. No early apparent complications. [name], MD 04/09/2022 4:49 PM   CT HEAD WO CONTRAST (5MM)  Result Date: 04/08/2022 CLINICAL DATA:  Headache. EXAM: CT HEAD WITHOUT CONTRAST TECHNIQUE: Contiguous axial images were obtained from the base of the skull through the vertex without intravenous contrast. RADIATION DOSE REDUCTION: This exam was performed according to the departmental dose-optimization program which includes automated exposure control, adjustment of the mA and/or kV according to patient size and/or use of iterative reconstruction technique. COMPARISON:  None Available. FINDINGS: Brain: There is no evidence of an acute infarct, intracranial hemorrhage, mass, midline shift, or extra-axial fluid collection. The ventricles and sulci are normal. Vascular: No hyperdense vessel. Skull: No fracture or suspicious osseous lesion. Sinuses/Orbits: No significant acute inflammatory changes in the included portions of the paranasal sinuses. Wall thickening of the included portion of the right maxillary sinus suggesting a history of chronic sinusitis. Clear mastoid air cells. Unremarkable included orbits. Other: None. IMPRESSION: Negative head CT. Electronically Signed   By: Logan Bores M.D.   On: 04/08/2022 15:24    Scheduled Meds:  carvedilol  12.5 mg Oral BID   docusate sodium  100 mg Oral BID   feeding supplement (GLUCERNA SHAKE)  237 mL Oral TID BM   hydrOXYzine  50 mg Oral TID   insulin aspart  0-9 Units Subcutaneous TID WC   insulin glargine-yfgn  12 Units Subcutaneous Daily   isosorbide-hydrALAZINE  1 tablet  Oral TID   metoCLOPramide (REGLAN) injection  10 mg Intravenous TID AC & HS   multivitamin with minerals  1 tablet Oral Daily   pantoprazole  40 mg Oral Daily   rosuvastatin  20 mg Oral Daily   sodium chloride flush  3 mL Intravenous Q12H   Continuous Infusions:  ferric gluconate (FERRLECIT) IVPB 250 mg (04/09/22 1140)   promethazine (PHENERGAN) 25 mg in sodium chloride 0.9 % 50 mL infusion     promethazine (PHENERGAN) injection (IM or IVPB) 25 mg (04/10/22 0414)     LOS: 5 days   Darliss Cheney, MD [facility]  04/10/2022, 9:46 AM   *Please note that this is a verbal dictation therefore any spelling or grammatical errors are due to the "[place] One" system interpretation.  Please page via [place] and do not message via secure chat for urgent patient care matters. Secure chat can be used for non urgent patient care matters.  How to contact the [facility] Attending or Consulting provider [place] or covering provider during after hours [place], for this patient?  Check the care team in [facility] and look for a) attending/consulting TR[facility] provider listed and b) the [facility] team listed. Page or secure chat [phone]. Log into [email or web address] and use [place]'s universal password to access. If you do not have the password, please contact the hospital operator. Locate the [facility] provider you are looking for under [facility] and page to a number that you can be directly reached. If you still have difficulty reaching the provider, please page the [facility] (Director on Call) for the Hospitalists listed on amion for assistance.

## 2022-04-10 NOTE — Progress Notes (Signed)
Mobility Specialist Progress Note    04/10/22 1219  Mobility  Activity Ambulated independently in hallway  Level of Assistance Independent  Assistive Device None  Distance Ambulated (ft) 1200 ft  Activity Response Tolerated well  $Mobility charge 1 Mobility   During Mobility: 98 HR Post-Mobility: 89 HR  Pt received in chair and agreeable. No complaints on walk. Returned to chair with call bell in reach.    Hildred Alamin Mobility Specialist

## 2022-04-10 NOTE — Progress Notes (Signed)
Inpatient Diabetes Program Recommendations  AACE/ADA: New Consensus Statement on Inpatient Glycemic Control (2015)  Target Ranges:  Prepandial:   less than 140 mg/dL      Peak postprandial:   less than 180 mg/dL (1-2 hours)      Critically ill patients:  140 - 180 mg/dL   Lab Results  Component Value Date   GLUCAP 308 (H) 04/10/2022   HGBA1C 6.3 (H) 03/25/2022    Review of Glycemic Control  Diabetes history: Type 1 DM  Outpatient Diabetes medications:  Omnipod (uses approx. 50 units daily),  Tresiba 50 units daily (when off pump) Current orders for Inpatient glycemic control:  Novolog 0-9 units tid with meals Levemir 12 units daily Inpatient Diabetes Program Recommendations:    Please consider adding Novolog meal coverage 2 units tid with meals (hold if patient eats less than 50% or NPO).   Thanks,  Adah Perl, RN, BC-ADM Inpatient Diabetes Coordinator Pager [phone]  (8a-5p)

## 2022-04-10 NOTE — Progress Notes (Signed)
VAST consult received to obtain IV access. VAST RN spoke with patient's nurse regarding patient's IV access. She has had 6 IVs in 4 days with the last 3 IV's lasting less than 12 hours each; she is receiving phenergan doses frequently. Unit RN to discuss possible central line placement with MD.

## 2022-04-10 NOTE — Progress Notes (Signed)
[facility] Progress Note  Name: [name] MRN: [id number] DOB: November 27, 1970  Chief Complaint:  Witnessed arrest  Subjective:  Strict ins/outs are still not available.  She had three urine voids over 9/5. She got a pacemaker yesterday.  Having nausea this am.  She is not aware of plans for today.   Review of systems:   Denies shortness of breath  Chest discomfort with deep breaths or straining  Has had nausea this am  -------------- Background on consult:  [name] is a 51 y.o. female with a history of CKD stage, type 1 DM, CHF, and anxiety who presented to the hospital with after she collapsed at a local [employer].  An RN nearby started CPR and EMS arrived shortly thereafter.  AED placed and she received shock.  Per charting 6-7 minutes of CPR.  Recent admission at the end of August with hematemesis; EGD with esophagitis and active bleed at that time.  She had another recent admission with AKI 8/21-8/24.  Diuretics were held after her 9/2 torsemide dose.  Nephrology is consulted for assistance with management.  She has been given fluids.  Strict ins/outs are not available. She had one urine void yesterday and one charted today thus far.  She has been on fluids today and denies any difficulty with her breathing.  She states torsemide currently off because "sometimes I lose too much water".  She states that she follows with Dr. Carolin Sicks at [facility].    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   Intake/Output Summary (Last 24 hours) at 04/10/2022 0725 Last data filed at 04/10/2022 0100 Gross per 24 hour  Intake 1236 ml  Output --  Net 1236 ml    Vitals:  Vitals:   04/09/22 1900 04/09/22 2220 04/09/22 2306 04/10/22 0428  BP: 124/67  118/69 (!) 140/86  Pulse: 88  83 84  Resp: '20 20 19 ' (!) 21  Temp: 99.1 F (37.3 C)  98.6 F (37 C) 98.7 F (37.1 C)  TempSrc: Oral  Oral Oral  SpO2:  90%  93% 94%     Physical Exam:    General:  adult female in bed in NAD HEENT: NCAT Eyes: EOMI sclera anicteric Neck: supple trachea midline Heart: S1S2 no rub Lungs: clear and unlabored; on room air  Abdomen: soft/nt/nd Extremities: no edema appreciated; no cyanosis or clubbing  Skin: no rash on extremities exposed Neuro: alert and oriented x 3 provides hx and follows commands; speaks softly  Psych normal mood and affect  Medications reviewed   Labs:     Latest Ref Rng & Units 04/10/2022    5:49 AM 04/09/2022   12:22 AM 04/08/2022   12:08 AM  BMP  Glucose 70 - 99 mg/dL 162  228  216   BUN 6 - 20 mg/dL 29  36  41   Creatinine 0.44 - 1.00 mg/dL 2.61  2.94  3.42   Sodium 135 - 145 mmol/L 140  140  137   Potassium 3.5 - 5.1 mmol/L 4.5  4.2  4.6   Chloride 98 - 111 mmol/L 114  111  109   CO2 22 - 32 mmol/L '22  22  19   ' Calcium 8.9 - 10.3 mg/dL 8.5  8.1  8.1      Assessment/Plan:   # AKI  - ischemic insults in the setting of arrest (arrest on 9/1).  May have some pre-renal insults as well.  Renal US  with some increased echogenicity; no hydro.  - improving with supportive care  - re-ordered strict ins/outs - would resume home torsemide 20 mg daily as needed for swelling    # CKD stage 3 - Baseline Cr 1.76 (EGFR 35) in April 2023 - Her more recent admission was previously charted as AKI and now compounded by further insult after ischemic event - she follows with Dr. Carolin Sicks at [facility]   # VF arrest  - supportive care per cardiology and primary team    # Chronic systolic CHF - can resume home torsemide 20 mg daily as needed on discharge    # Metabolic acidosis - mild and improved   # Anemia normocytic   - iron deficiency - IV iron x 2 doses - PRBC's per primary team; no acute need  Disposition per primary team.  I will ensure that when she is discharged she has follow-up with our office in 2 weeks  Claudia Desanctis, MD 04/10/2022 7:33 AM

## 2022-04-10 NOTE — Plan of Care (Signed)

## 2022-04-11 DIAGNOSIS — I469 Cardiac arrest, cause unspecified: Secondary | ICD-10-CM | POA: Diagnosis not present

## 2022-04-11 DIAGNOSIS — N1832 Chronic kidney disease, stage 3b: Secondary | ICD-10-CM

## 2022-04-11 DIAGNOSIS — I5032 Chronic diastolic (congestive) heart failure: Secondary | ICD-10-CM

## 2022-04-11 DIAGNOSIS — I10 Essential (primary) hypertension: Secondary | ICD-10-CM

## 2022-04-11 LAB — CBC WITH DIFFERENTIAL/PLATELET
Abs Immature Granulocytes: 0.02 10*3/uL (ref 0.00–0.07)
Basophils Absolute: 0 10*3/uL (ref 0.0–0.1)
Basophils Relative: 0 %
Eosinophils Absolute: 0.1 10*3/uL (ref 0.0–0.5)
Eosinophils Relative: 2 %
HCT: 22.4 % — ABNORMAL LOW (ref 36.0–46.0)
Hemoglobin: 7.5 g/dL — ABNORMAL LOW (ref 12.0–15.0)
Immature Granulocytes: 0 %
Lymphocytes Relative: 22 %
Lymphs Abs: 1.4 10*3/uL (ref 0.7–4.0)
MCH: 28.6 pg (ref 26.0–34.0)
MCHC: 33.5 g/dL (ref 30.0–36.0)
MCV: 85.5 fL (ref 80.0–100.0)
Monocytes Absolute: 0.5 10*3/uL (ref 0.1–1.0)
Monocytes Relative: 8 %
Neutro Abs: 4.5 10*3/uL (ref 1.7–7.7)
Neutrophils Relative %: 68 %
Platelets: 204 10*3/uL (ref 150–400)
RBC: 2.62 MIL/uL — ABNORMAL LOW (ref 3.87–5.11)
RDW: 14.2 % (ref 11.5–15.5)
WBC: 6.6 10*3/uL (ref 4.0–10.5)
nRBC: 0 % (ref 0.0–0.2)

## 2022-04-11 LAB — COMPREHENSIVE METABOLIC PANEL
ALT: 10 U/L (ref 0–44)
AST: 11 U/L — ABNORMAL LOW (ref 15–41)
Albumin: 2.1 g/dL — ABNORMAL LOW (ref 3.5–5.0)
Alkaline Phosphatase: 56 U/L (ref 38–126)
Anion gap: 6 (ref 5–15)
BUN: 31 mg/dL — ABNORMAL HIGH (ref 6–20)
CO2: 22 mmol/L (ref 22–32)
Calcium: 8.2 mg/dL — ABNORMAL LOW (ref 8.9–10.3)
Chloride: 110 mmol/L (ref 98–111)
Creatinine, Ser: 2.69 mg/dL — ABNORMAL HIGH (ref 0.44–1.00)
GFR, Estimated: 21 mL/min — ABNORMAL LOW (ref >60)
Glucose, Bld: 209 mg/dL — ABNORMAL HIGH (ref 70–99)
Potassium: 4.6 mmol/L (ref 3.5–5.1)
Sodium: 138 mmol/L (ref 135–145)
Total Bilirubin: 0.5 mg/dL (ref 0.3–1.2)
Total Protein: 4.9 g/dL — ABNORMAL LOW (ref 6.5–8.1)

## 2022-04-11 LAB — GLUCOSE, CAPILLARY
Glucose-Capillary: 229 mg/dL — ABNORMAL HIGH (ref 70–99)
Glucose-Capillary: 230 mg/dL — ABNORMAL HIGH (ref 70–99)
Glucose-Capillary: 226 mg/dL — ABNORMAL HIGH (ref 70–99)

## 2022-04-11 MED ORDER — INSULIN DETEMIR 100 UNIT/ML ~~LOC~~ SOLN
14.0000 [IU] | Freq: Every day | SUBCUTANEOUS | Status: DC
  Filled 2022-04-11: qty 0.14

## 2022-04-11 MED ORDER — ROSUVASTATIN CALCIUM 20 MG PO TABS
20.0000 mg | ORAL_TABLET | Freq: Every day | ORAL | 0 refills | Status: DC
Start: 2022-04-12 — End: 2022-05-24

## 2022-04-11 MED ORDER — METOCLOPRAMIDE HCL 10 MG PO TABS: 10.0000 mg | ORAL_TABLET | Freq: Three times a day (TID) | ORAL | 0 refills | Status: DC

## 2022-04-11 MED ORDER — INSULIN ASPART 100 UNIT/ML IJ SOLN
3.0000 [IU] | Freq: Three times a day (TID) | INTRAMUSCULAR | Status: DC
  Administered 2022-04-11: 3 [IU] via SUBCUTANEOUS

## 2022-04-11 MED ORDER — ISOSORB DINITRATE-HYDRALAZINE 20-37.5 MG PO TABS: 1.0000 | ORAL_TABLET | Freq: Three times a day (TID) | ORAL | 0 refills | Status: AC

## 2022-04-11 NOTE — Discharge Summary (Addendum)
[facility] Discharge Summary  [name] CXK:[id number] DOB: [birth year] DOA: 04/05/2022  PCP: [name], PA  Admit date: 04/05/2022 Discharge date: 04/11/2022 30 Day Unplanned Readmission Risk Score    Flowsheet Row ED to Hosp-Admission (Current) from 04/05/2022 in [facility]  30 Day Unplanned Readmission Risk Score (%) 21.06 Filed at 04/11/2022 0801       This score is the patient's risk of an unplanned readmission within 30 days of being discharged (0 -100%). The score is based on dignosis, age, lab data, medications, orders, and past utilization.   Low:  0-14.9   Medium: 15-21.9   High: 22-29.9   Extreme: 30 and above          Admitted From: Home Disposition: Home  Recommendations for Outpatient Follow-up:  Follow up with PCP in 1-2 weeks Please obtain BMP/CBC in one week Follow-up with nephrology-they will set up appointment for you For with cardiology, they will set up appointment for you, if you do not hear from them, please call their office. Please follow up with your PCP on the following pending results: Unresulted Labs (From admission, onward)     Start     Ordered   04/05/22 1806  MRSA Next Gen by PCR, Nasal  Once,   R        04/05/22 1806              Home Health: None Equipment/Devices: None  Discharge Condition: Stable CODE STATUS: Full code Diet recommendation: Cardiac  Subjective: Seen and examined.  She has no complaints.  She says that her nausea is resolved and she tolerated breakfast this morning and that she desires to go home.  Brief/Interim Summary: 51 y.o. female with medical history significant of DM with gastroparesis, stage 4 CKD, and chronic diastolic CHF presenting with syncope vs. Cardiac arrest.  She was last admitted from 8/28-30 for hematemesis and EGD revealed grade A esophagitis with active bleeding at the time of scope.   Prior to that, she was admitted from 8/21-24 for AKI that was thought to  be related to gastroparesis.     After her recent discharge she went to [place] to have lunch with standing in the line she passed out, up to not standing by initiated CPR, there was a AED device on hand which suggested a shock and it was given.  She was subsequently brought to the ER.  Post CPR she developed some substernal pain and discomfort   1. Cardiac arrest.  She received appropriate CPR along with AED device prompted shock at a retail facility, troponin trend stable and in non-ACS pattern, EKG nonacute however limited repeat echo shows that her EF has dropped from recent 55% a month ago to 40 to 45%.  Seen by cardiology and EP AND UNDERWENT AICD placement on 04/09/2022.  Incision looks good.  Cardiology has cleared her for discharge.   2.  Ongoing nausea vomiting recent diagnosis of gastroparesis along with esophagitis.  On PPI, Reglan Premeal, seen by GI, nausea worse on 04/08/2022 discussed with GI, continue supportive care also head CT obtained which was unremarkable.  Her's have resolved with Reglan.  I will discharge her on Reglan for 15 days 3 times daily pre-meal.   3.  AKI on CKD stage IV.  Patient's baseline creatinine appears to be around 1.7-1.8 in April 2023.  Her creatinine peaked at 3.42 on 04/08/2022, improving now.  Down to 2.6 and has remained stable, nephrology was managing and  they have also cleared the patient for discharge and they will follow-up with her as outpatient.  4.  Chronic systolic and diastolic CHF EF now 40 to 45%.  Currently compensated.  Cardiology on board.  Cardiology has recommended to discharge the patient on Coreg and BiDil.  They will follow-up with her as outpatient.   5.  Hypertension.  On Coreg and Imdur.  Blood pressure very well controlled.  Demadex per Nephrology and cardiology.   6. DM. type II she wears insulin pump but has taken it off.  Resume as outpatient.  Discharge plan was discussed with patient and/or family member and they verbalized  understanding and agreed with it.  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   albuterol 108 (90 Base) MCG/ACT inhaler Commonly known as: ProAir HFA Inhale 2 puffs into the lungs every 6 (six) hours as needed for wheezing or shortness of breath.   carvedilol 12.5 MG tablet Commonly known as: COREG Take 1 tablet (12.5 mg total) by mouth 2 (two) times daily.   FreeStyle Libre 2 Sensor Misc CHANGE EVERY 14 DAYS   hydrOXYzine 50 MG tablet Commonly known as: ATARAX Take 50 mg by mouth in the morning, at noon, and at bedtime.   isosorbide-hydrALAZINE 20-37.5 MG tablet Commonly known as: BIDIL Take 1 tablet by mouth 3 (three) times daily.   LUBRICATING EYE DROPS OP Place 1 drop into both eyes daily as needed (dry eyes).   metoCLOPramide 10 MG tablet Commonly known as: Reglan Take 1 tablet (10 mg total) by mouth 3 (three) times daily before meals for 15 days.   NovoLOG 100 UNIT/ML injection Generic drug: insulin aspart Inject 0-120 Units into the skin daily.   OmniPod 10 Pack Misc by Does not apply route.   pantoprazole 40 MG tablet Commonly known as: Protonix Take 1 tablet (40 mg total) by mouth daily.   polyethylene glycol 17 g packet Commonly known as: MIRALAX  / GLYCOLAX Take 17 g by mouth daily as needed for moderate constipation.   pregabalin 50 MG capsule Commonly known as: LYRICA Take 50 mg by mouth 2 (two) times daily as needed (neuropathy).   promethazine 12.5 MG tablet Commonly known as: PHENERGAN Take 1 tablet (12.5 mg total) by mouth every 6 (six) hours as needed for nausea or vomiting.   rosuvastatin 20 MG tablet Commonly known as: CRESTOR Take 1 tablet (20 mg total) by mouth daily. Start taking on: April 12, 2022   tobramycin 0.3 % ophthalmic solution Commonly known as: TOBREX Place 1-2 drops into both eyes See admin instructions. Instill 1-2 drops into both eyes 3 times daily the day before, day of, and the day after monthly eye injections  torsemide 20 MG tablet Commonly known as: DEMADEX Take 20 mg by mouth daily as needed (swelling).   [name] 200 UNIT/ML FlexTouch Pen Generic drug: insulin degludec Inject 50 Units into the skin daily as needed (when insulin pump stops working).        Follow-up [facility] [place] Follow up.   Why: on 9/15 at 1220 for post hospital ICD check Contact information: [place] [address] [phone]        [name], [place] Follow up in 1 week(s).   Specialty: Internal Medicine Contact information: [address] [place] [address] [phone]         [name], MD .   Specialty: Cardiology Contact information: [phone] [place] [address] [phone]                Allergies  Allergen Reactions   Amoxicillin Itching   Atorvastatin Other (See Comments)    Dizziness    Dulaglutide Nausea And Vomiting   Lantus [Insulin Glargine] Itching and Rash   Miconazole Nitrate Rash    Consultations: Cardiology and nephrology   Procedures/Studies: DG Chest 2 View  Result Date:  04/10/2022 CLINICAL DATA:  51 year old female status post cardiac device placement. EXAM: CHEST - 2 VIEW COMPARISON:  04/07/2022 FINDINGS: The mediastinal contours are within normal limits. Unchanged cardiomegaly. Interval placement of left chest wall, subclavian vein approach single lead pacer AICD with the lead tip projecting over the expected location of the right ventricle. Mild blunting of the costophrenic angles bilaterally with mild bibasilar hazy opacities. Mild cephalization of the pulmonary vasculature. No pneumothorax. No acute osseous abnormality. IMPRESSION: 1. Status post insertion of left subclavian vein approach single lead pacemaker/AICD with leads in expected location. 2. Mild pulmonary edema and associated trace bilateral pleural effusions. 3. Unchanged cardiomegaly. Electronically Signed   By: Ruthann Cancer M.D.   On: 04/10/2022 08:06   EP PPM/ICD IMPLANT  Result Date: 04/09/2022 SURGEON:  Allegra Lai, MD    PREPROCEDURE DIAGNOSES:  1. Cardiac arrest due to VT/VF     POSTPROCEDURE DIAGNOSES:  1. Cardiac arrest due to VT/VF    PROCEDURES:   1. ICD implantation.  INTRODUCTION:  Raelene Trew is a 51 y.o. female with an out of hospital cardiac arrest. No cause has been found. Presents to the lab today for ICD implant    DESCRIPTION OF PROCEDURE:  Informed written consent was obtained and the patient was brought to the electrophysiology lab in the fasting state. The patient was adequately sedated with intravenous Versed, and fentanyl as outlined in the nursing report.  The patient's left chest was prepped and draped in the usual sterile fashion by the EP lab staff.  The skin overlying the left deltopectoral region was infiltrated with lidocaine for local analgesia.  A 5-cm incision was made over the left deltopectoral region.  A left subcutaneous defibrillator pocket was fashioned using a combination of sharp and blunt dissection.  Electrocautery was used to assure hemostasis. RA/RV Lead  Placement: The left axillary vein was cannulated with fluoroscopic visualization.  Through the left axillary vein, a Medtronic Sprint Quattro Secure [id number] (serial number [id number]) right ventricular defibrillator lead were advanced with fluoroscopic visualization into the right ventricular apex positions.  Initial right ventricular lead R-wave measured 7.2 mV with impedance of 510 ohms and a threshold of 0.4 volts at  0.5 milliseconds. The leads were secured to the pectoralis  fascia using #2 silk suture over the suture sleeves.  The pocket then  irrigated with copious gentamicin solution.  The leads were then  connected to a Medtronic Visia AF MRI [id number]  (serial  Number [id number]) ICD.  The defibrillator was placed into the  pocket.  The pocket was then closed in 3 layers with 2.0 Vicryl suture for the subcutaneous and 3.0 Vicryl suture subcuticular layers. EBL<53ml.  Steri-Strips and a  sterile dressing were then applied.  CONCLUSIONS:  1. Cardiac arrest due to VT/VF  2. Successful ICD implantation.  3. No early apparent complications. [name], MD 04/09/2022 4:49 PM   CT HEAD WO CONTRAST (5MM)  Result Date: 04/08/2022 CLINICAL DATA:  Headache. EXAM: CT HEAD WITHOUT CONTRAST TECHNIQUE: Contiguous axial images were obtained from the base of the skull through the vertex without intravenous contrast. RADIATION DOSE REDUCTION: This exam was performed according to the departmental dose-optimization program which includes automated exposure control, adjustment of the mA and/or kV according to patient size and/or use of iterative reconstruction technique. COMPARISON:  None Available. FINDINGS: Brain: There is no evidence of an acute infarct, intracranial hemorrhage, mass, midline shift, or extra-axial fluid collection. The ventricles and sulci are normal. Vascular: No hyperdense vessel. Skull: No fracture or suspicious osseous lesion. Sinuses/Orbits: No significant acute inflammatory changes in the  included portions of the paranasal sinuses. Wall thickening of the included portion of the right maxillary sinus suggesting a history of chronic sinusitis. Clear mastoid air cells. Unremarkable included orbits. Other: None. IMPRESSION: Negative head CT. Electronically Signed   By: Logan Bores M.D.   On: 04/08/2022 15:24   ECHOCARDIOGRAM COMPLETE  Result Date: 04/07/2022    ECHOCARDIOGRAM REPORT   Patient Name:   [name] Date of Exam: 04/07/2022 Medical Rec #:  [id number]      Height:       72.0 in Accession #:    [id number]     Weight:       180.7 lb Date of Birth:  14-Sep-1970     BSA:          2.041 m Patient Age:    51 years       BP:           111/66 mmHg Patient Gender: F              HR:           76 bpm. Exam Location:  Inpatient Procedure: 2D Echo, Cardiac Doppler and Color Doppler Indications:    I46.9 Cardiac Arrest  History:        Patient has prior history of Echocardiogram examinations, most                 recent 04/05/2022. CHF; Risk Factors:Hypertension and Diabetes.  Sonographer:    Alvino Chapel RCS Referring Phys: [id number] [place]  1. Left ventricular ejection fraction, by estimation, is 50 to 55%. The left ventricle has low normal function. The left ventricle has no regional wall motion abnormalities. There is mild left ventricular hypertrophy. Left ventricular diastolic parameters are consistent with Grade II diastolic dysfunction (pseudonormalization).  2. Right ventricular systolic function is normal. The right ventricular size is normal. There is mildly elevated pulmonary artery systolic pressure.  3. Left atrial size was severely dilated.  4. The mitral valve is normal in structure. No evidence of mitral valve regurgitation. No evidence of mitral stenosis.  5.  The aortic valve is normal in structure. Aortic valve regurgitation is not visualized. No aortic stenosis is present.  6. The inferior vena cava is dilated in size with >50% respiratory variability, suggesting right  atrial pressure of 8 mmHg. Comparison(s): Prior images reviewed side by side. The left ventricular function has improved. FINDINGS  Left Ventricle: Left ventricular ejection fraction, by estimation, is 50 to 55%. The left ventricle has low normal function. The left ventricle has no regional wall motion abnormalities. The left ventricular internal cavity size was normal in size. There is mild left ventricular hypertrophy. Left ventricular diastolic parameters are consistent with Grade II diastolic dysfunction (pseudonormalization). Right Ventricle: The right ventricular size is normal. No increase in right ventricular wall thickness. Right ventricular systolic function is normal. There is mildly elevated pulmonary artery systolic pressure. The tricuspid regurgitant velocity is 2.37  m/s, and with an assumed right atrial pressure of 15 mmHg, the estimated right ventricular systolic pressure is 29.9 mmHg. Left Atrium: Left atrial size was severely dilated. Right Atrium: Right atrial size was normal in size. Pericardium: There is no evidence of pericardial effusion. Mitral Valve: The mitral valve is normal in structure. No evidence of mitral valve regurgitation. No evidence of mitral valve stenosis. Tricuspid Valve: The tricuspid valve is normal in structure. Tricuspid valve regurgitation is trivial. No evidence of tricuspid stenosis. Aortic Valve: The aortic valve is normal in structure. Aortic valve regurgitation is not visualized. No aortic stenosis is present. Pulmonic Valve: The pulmonic valve was normal in structure. Pulmonic valve regurgitation is trivial. No evidence of pulmonic stenosis. Aorta: The aortic root is normal in size and structure. Venous: The inferior vena cava is dilated in size with greater than 50% respiratory variability, suggesting right atrial pressure of 8 mmHg. IAS/Shunts: No atrial level shunt detected by color flow Doppler.  LEFT VENTRICLE PLAX 2D LVIDd:         5.10 cm   Diastology LVIDs:          3.10 cm   LV e' medial:    6.85 cm/s LV PW:         1.20 cm   LV E/e' medial:  12.8 LV IVS:        0.90 cm   LV e' lateral:   8.05 cm/s LVOT diam:     2.00 cm   LV E/e' lateral: 10.9 LV SV:         68 LV SV Index:   34 LVOT Area:     3.14 cm  RIGHT VENTRICLE RV S prime:     13.80 cm/s TAPSE (M-mode): 2.8 cm LEFT ATRIUM              Index        RIGHT ATRIUM           Index LA diam:        4.80 cm  2.35 cm/m   RA Area:     18.20 cm LA Vol (A2C):   108.0 ml 52.92 ml/m  RA Volume:   46.30 ml  22.69 ml/m LA Vol (A4C):   108.0 ml 52.92 ml/m LA Biplane Vol: 107.0 ml 52.43 ml/m  AORTIC VALVE LVOT Vmax:   113.00 cm/s LVOT Vmean:  78.800 cm/s LVOT VTI:    0.218 m  AORTA Ao Root diam: 3.00 cm MITRAL VALVE               TRICUSPID VALVE MV Area (PHT): 4.49 cm    TR Peak grad:  22.5 mmHg MV Decel Time: 169 msec    TR Vmax:        237.00 cm/s MV E velocity: 87.40 cm/s MV A velocity: 34.60 cm/s  SHUNTS MV E/A ratio:  2.53        Systemic VTI:  0.22 m                            Systemic Diam: 2.00 cm Candee Furbish MD Electronically signed by Candee Furbish MD Signature Date/Time: 04/07/2022/2:13:57 PM    Final    US RENAL  Result Date: 04/07/2022 CLINICAL DATA:  Acute kidney injury. EXAM: RENAL / URINARY TRACT ULTRASOUND COMPLETE COMPARISON:  Renal ultrasound, 03/27/2022. FINDINGS: Right Kidney: Renal measurements: 12.5 x 5.4 x 5.9 cm = volume: 209 mL. Borderline increased parenchymal echogenicity. Small upper pole cyst, 1.5 cm. No other masses, no stones and no hydronephrosis. Left Kidney: Renal measurements: 11.7 x 6.0 x 5.5 cm = volume: 200 mL. Echogenicity within normal limits. No mass or hydronephrosis visualized. Bladder: Appears normal for degree of bladder distention. Other: None. IMPRESSION: 1. No acute findings. 2. Small upper pole right renal cyst. No other abnormalities. Upper pole cyst not visualized on the prior ultrasound. Electronically Signed   By: Lajean Manes M.D.   On: 04/07/2022 11:54   DG Chest  Port 1 View  Result Date: 04/07/2022 CLINICAL DATA:  51 year old female with shortness of breath. EXAM: PORTABLE CHEST 1 VIEW COMPARISON:  Portable chest 04/05/2022 and earlier. FINDINGS: Portable AP semi upright view at 0643 hours. Stable lung volumes and mediastinal contours with cardiomegaly. Regressed pulmonary vascular congestion. Mildly improved ventilation. Residual streaky opacity at the left lung base. No pneumothorax or definite pleural effusion is evident. Visualized tracheal air column is within normal limits. No osseous abnormality identified. Paucity of bowel gas in the upper abdomen. IMPRESSION: 1. Regressed pulmonary interstitial edema and improved ventilation since yesterday. 2. Residual streaky opacity at the left lung base is nonspecific, favor atelectasis. Electronically Signed   By: Genevie Ann M.D.   On: 04/07/2022 08:41   DG Chest Portable 1 View  Result Date: 04/05/2022 CLINICAL DATA:  Syncopal episode EXAM: PORTABLE CHEST 1 VIEW COMPARISON:  None Available. FINDINGS: Enlarged cardiac silhouette. There is perihilar venous congestion. LEFT hemidiaphragm is partially obscured. Upper lungs clear. IMPRESSION: Cardiomegaly and venous congestion. Potential pleural fluid on the LEFT. Findings suggest volume overload. Electronically Signed   By: Suzy Bouchard M.D.   On: 04/05/2022 14:09   US RENAL  Result Date: 03/27/2022 CLINICAL DATA:  AKI EXAM: RENAL / URINARY TRACT ULTRASOUND COMPLETE COMPARISON:  Abdominal ultrasound 05/01/2021, CT abdomen/pelvis 08/24/2020 and 11/21/2018 FINDINGS: Right Kidney: Renal measurements: 12.4 cm x 4.6 cm x 5.5 cm = volume: 165 mL. Echogenicity within normal limits. No mass or hydronephrosis visualized. Left Kidney: Renal measurements: 10.2 cm x 5.7 cm x 5.1 cm = volume: 160 mL. Echogenicity within normal limits. No mass or hydronephrosis visualized. Bladder: Appears normal for degree of bladder distention. Both ureteral jets were identified. Other: None.  IMPRESSION: Normal renal ultrasound. Electronically Signed   By: Valetta Mole M.D.   On: 03/27/2022 10:32   ECHOCARDIOGRAM COMPLETE  Result Date: 03/26/2022    ECHOCARDIOGRAM REPORT   Patient Name:   [name] Date of Exam: 03/26/2022 Medical Rec #:  [id number]      Height:       72.0 in Accession #:    [id number]     Weight:  170.0 lb Date of Birth:  [birth year]     BSA:          1.988 m Patient Age:    47 years       BP:           109/68 mmHg Patient Gender: F              HR:           78 bpm. Exam Location:  Inpatient Procedure: 2D Echo, Cardiac Doppler, Color Doppler and Strain Analysis Indications:    CHF-Acute Systolic F00.71  History:        Patient has prior history of Echocardiogram examinations, most                 recent 08/25/2020. CHF; Risk Factors:Diabetes. Chronic kidney                 disease. Anemia.  Sonographer:    Darlina Sicilian RDCS Referring Phys: [place]  1. Left ventricular ejection fraction, by estimation, is 50 to 55%. The left ventricle has low normal function. The left ventricle has no regional wall motion abnormalities. The left ventricular internal cavity size was mildly dilated. There is mild left ventricular hypertrophy. Left ventricular diastolic parameters are consistent with Grade II diastolic dysfunction (pseudonormalization). The average left ventricular global longitudinal strain is -12.0 %. The global longitudinal strain is abnormal.  2. Right ventricular systolic function is normal. The right ventricular size is normal. There is normal pulmonary artery systolic pressure.  3. The mitral valve is normal in structure. No evidence of mitral valve regurgitation.  4. The aortic valve is tricuspid. Aortic valve regurgitation is not visualized.  5. The inferior vena cava is normal in size with greater than 50% respiratory variability, suggesting right atrial pressure of 3 mmHg. Conclusion(s)/Recommendation(s): Compared to prior study, PASP has  improved. Otherwise no significant changes. FINDINGS  Left Ventricle: Left ventricular ejection fraction, by estimation, is 50 to 55%. The left ventricle has low normal function. The left ventricle has no regional wall motion abnormalities. The average left ventricular global longitudinal strain is -12.0 %. The global longitudinal strain is abnormal. The left ventricular internal cavity size was mildly dilated. There is mild left ventricular hypertrophy. Left ventricular diastolic parameters are consistent with Grade II diastolic dysfunction (pseudonormalization). Right Ventricle: The right ventricular size is normal. Right ventricular systolic function is normal. There is normal pulmonary artery systolic pressure. The tricuspid regurgitant velocity is 2.00 m/s, and with an assumed right atrial pressure of 3 mmHg,  the estimated right ventricular systolic pressure is 21.9 mmHg. Left Atrium: Left atrial size was normal in size. Right Atrium: Right atrial size was normal in size. Pericardium: There is no evidence of pericardial effusion. Mitral Valve: The mitral valve is normal in structure. There is mild thickening of the mitral valve leaflet(s). There is mild calcification of the mitral valve leaflet(s). No evidence of mitral valve regurgitation. Tricuspid Valve: The tricuspid valve is normal in structure. Tricuspid valve regurgitation is trivial. Aortic Valve: The aortic valve is tricuspid. Aortic valve regurgitation is not visualized. Pulmonic Valve: Pulmonic valve regurgitation is not visualized. Aorta: The aortic root and ascending aorta are structurally normal, with no evidence of dilitation. Venous: The inferior vena cava is normal in size with greater than 50% respiratory variability, suggesting right atrial pressure of 3 mmHg.  LEFT VENTRICLE PLAX 2D LVIDd:         5.00 cm  Diastology LVIDs:         3.60 cm      LV e' medial:    6.13 cm/s LV PW:         1.10 cm      LV E/e' medial:  13.0 LV IVS:         0.90 cm      LV e' lateral:   8.41 cm/s LVOT diam:     2.00 cm      LV E/e' lateral: 9.5 LV SV:         48 LV SV Index:   24           2D Longitudinal Strain LVOT Area:     3.14 cm     2D Strain GLS Avg:     -12.0 %  LV Volumes (MOD) LV vol d, MOD A2C: 133.0 ml LV vol d, MOD A4C: 145.0 ml LV vol s, MOD A2C: 71.1 ml LV vol s, MOD A4C: 52.7 ml LV SV MOD A2C:     61.9 ml LV SV MOD A4C:     145.0 ml LV SV MOD BP:      76.0 ml RIGHT VENTRICLE RV S prime:     12.00 cm/s TAPSE (M-mode): 1.8 cm LEFT ATRIUM             Index        RIGHT ATRIUM           Index LA diam:        4.30 cm 2.16 cm/m   RA Area:     13.70 cm LA Vol (A2C):   65.8 ml 33.09 ml/m  RA Volume:   31.90 ml  16.04 ml/m LA Vol (A4C):   69.4 ml 34.90 ml/m LA Biplane Vol: 68.1 ml 34.25 ml/m  AORTIC VALVE LVOT Vmax:   72.80 cm/s LVOT Vmean:  55.250 cm/s LVOT VTI:    0.152 m  AORTA Ao Root diam: 3.00 cm Ao Asc diam:  3.10 cm MITRAL VALVE               TRICUSPID VALVE MV Area (PHT): 3.46 cm    TR Peak grad:   16.0 mmHg MV Decel Time: 219 msec    TR Vmax:        200.00 cm/s MV E velocity: 79.70 cm/s MV A velocity: 43.30 cm/s  SHUNTS MV E/A ratio:  1.84        Systemic VTI:  0.15 m                            Systemic Diam: 2.00 cm Phineas Inches Electronically signed by Phineas Inches Signature Date/Time: 03/26/2022/3:17:41 PM    Final    DG Chest Port 1 View  Result Date: 03/25/2022 CLINICAL DATA:  Vomiting. EXAM: PORTABLE CHEST 1 VIEW COMPARISON:  08/25/2020 FINDINGS: The cardio pericardial silhouette is enlarged. There is pulmonary vascular congestion without overt pulmonary edema. Probable atelectasis left base with likely tiny bilateral pleural effusions. Telemetry leads overlie the chest. IMPRESSION: 1. Enlargement of the cardiopericardial silhouette with pulmonary vascular congestion. 2. Probable tiny bilateral pleural effusions. Electronically Signed   By: Misty Stanley M.D.   On: 03/25/2022 10:18     Discharge Exam: Vitals:   04/11/22 0753  04/11/22 1137  BP: 134/69 (!) 142/76  Pulse: 82 84  Resp: 18 17  Temp: 98.2 F (36.8 C) 98.3 F (36.8 C)  SpO2: 94% 94%  Vitals:   04/10/22 2307 04/11/22 0330 04/11/22 0753 04/11/22 1137  BP: (!) 151/82 118/68 134/69 (!) 142/76  Pulse: 91 80 82 84  Resp: $Remo'18 13 18 17  'ILTAo$ Temp: 98.3 F (36.8 C) 98.3 F (36.8 C) 98.2 F (36.8 C) 98.3 F (36.8 C)  TempSrc: Oral Oral Oral Oral  SpO2: 100% 94% 94% 94%    General: Pt is alert, awake, not in acute distress Cardiovascular: RRR, S1/S2 +, no rubs, no gallops Respiratory: CTA bilaterally, no wheezing, no rhonchi Abdominal: Soft, NT, ND, bowel sounds + Extremities: no edema, no cyanosis    The results of significant diagnostics from this hospitalization (including imaging, microbiology, ancillary and laboratory) are listed below for reference.     Microbiology: Recent Results (from the past 240 hour(s))  Resp Panel by RT-PCR (Flu A&B, Covid) Anterior Nasal Swab     Status: None   Collection Time: 04/05/22  1:43 PM   Specimen: Anterior Nasal Swab  Result Value Ref Range Status   SARS Coronavirus 2 by RT PCR NEGATIVE NEGATIVE Final    Comment: (NOTE) SARS-CoV-2 target nucleic acids are NOT DETECTED.  The SARS-CoV-2 RNA is generally detectable in upper respiratory specimens during the acute phase of infection. The lowest concentration of SARS-CoV-2 viral copies this assay can detect is 138 copies/mL. A negative result does not preclude SARS-Cov-2 infection and should not be used as the sole basis for treatment or other patient management decisions. A negative result may occur with  improper specimen collection/handling, submission of specimen other than nasopharyngeal swab, presence of viral mutation(s) within the areas targeted by this assay, and inadequate number of viral copies(<138 copies/mL). A negative result must be combined with clinical observations, patient history, and epidemiological information. The expected result  is Negative.  Fact Sheet for Patients:  EntrepreneurPulse.com.au  Fact Sheet for Healthcare Providers:  IncredibleEmployment.be  This test is no t yet approved or cleared by the [place] FDA and  has been authorized for detection and/or diagnosis of SARS-CoV-2 by FDA under an Emergency Use Authorization (EUA). This EUA will remain  in effect (meaning this test can be used) for the duration of the COVID-19 declaration under Section 564(b)(1) of the Act, 21 U.S.C.section 360bbb-3(b)(1), unless the authorization is terminated  or revoked sooner.       Influenza A by PCR NEGATIVE NEGATIVE Final   Influenza B by PCR NEGATIVE NEGATIVE Final    Comment: (NOTE) The Xpert Xpress SARS-CoV-2/FLU/RSV plus assay is intended as an aid in the diagnosis of influenza from Nasopharyngeal swab specimens and should not be used as a sole basis for treatment. Nasal washings and aspirates are unacceptable for Xpert Xpress SARS-CoV-2/FLU/RSV testing.  Fact Sheet for Patients: EntrepreneurPulse.com.au  Fact Sheet for Healthcare Providers: IncredibleEmployment.be  This test is not yet approved or cleared by the [place] FDA and has been authorized for detection and/or diagnosis of SARS-CoV-2 by FDA under an Emergency Use Authorization (EUA). This EUA will remain in effect (meaning this test can be used) for the duration of the COVID-19 declaration under Section 564(b)(1) of the Act, 21 U.S.C. section 360bbb-3(b)(1), unless the authorization is terminated or revoked.  Performed at [facility], [place] [address], [place], [place] [address]   Surgical PCR screen     Status: None   Collection Time: 04/09/22  8:06 AM   Specimen: Nasal Mucosa; Nasal Swab  Result Value Ref Range Status   MRSA, PCR NEGATIVE NEGATIVE Final   Staphylococcus aureus NEGATIVE NEGATIVE  Final    Comment: (NOTE) The Xpert SA Assay (FDA  approved for NASAL specimens in patients 88 years of age and older), is one component of a comprehensive surveillance program. It is not intended to diagnose infection nor to guide or monitor treatment. Performed at [facility], [place] [address], [place], [place] [address]      Labs: BNP (last 3 results) Recent Labs    03/25/22 0900  BNP 149.7*   Basic Metabolic Panel: Recent Labs  Lab 04/06/22 0615 04/07/22 0000 04/08/22 0008 04/09/22 0022 04/10/22 0549 04/11/22 0430  NA  --  137 137 140 140 138  K  --  4.5 4.6 4.2 4.5 4.6  CL  --  109 109 111 114* 110  CO2  --  22 19* $Remo'22 22 22  'fhNzH$ GLUCOSE  --  306* 216* 228* 162* 209*  BUN  --  38* 41* 36* 29* 31*  CREATININE  --  3.26* 3.42* 2.94* 2.61* 2.69*  CALCIUM  --  7.7* 8.1* 8.1* 8.5* 8.2*  MG 1.7 2.2 2.1 2.0 1.9  --    Liver Function Tests: Recent Labs  Lab 04/07/22 0000 04/08/22 0008 04/09/22 0022 04/10/22 0549 04/11/22 0430  AST 10* 10* 10* 11* 11*  ALT $Re'11 9 8 10 10  'IlC$ ALKPHOS 53 54 56 56 56  BILITOT 0.5 0.3 0.6 0.7 0.5  PROT 4.7* 4.8* 4.7* 5.1* 4.9*  ALBUMIN 2.0* 2.1* 2.0* 2.2* 2.1*   Recent Labs  Lab 04/05/22 1406  LIPASE 24   No results for input(s): "AMMONIA" in the last 168 hours. CBC: Recent Labs  Lab 04/07/22 0000 04/08/22 0008 04/09/22 0022 04/10/22 0549 04/11/22 0430  WBC 6.5 6.4 6.6 5.5 6.6  NEUTROABS 3.6 3.9 4.0 3.4 4.5  HGB 7.8* 7.7* 7.8* 8.0* 7.5*  HCT 23.6* 24.0* 23.4* 24.5* 22.4*  MCV 84.9 86.3 85.1 86.0 85.5  PLT 200 235 247 242 204   Cardiac Enzymes: No results for input(s): "CKTOTAL", "CKMB", "CKMBINDEX", "TROPONINI" in the last 168 hours. BNP: Invalid input(s): "POCBNP" CBG: Recent Labs  Lab 04/10/22 1330 04/10/22 1755 04/10/22 2143 04/11/22 0623 04/11/22 1135  GLUCAP 308* 229* 184* 230* 226*   D-Dimer No results for input(s): "DDIMER" in the last 72 hours. Hgb A1c No results for input(s): "HGBA1C" in the last 72 hours. Lipid Profile No results for input(s):  "CHOL", "HDL", "LDLCALC", "TRIG", "CHOLHDL", "LDLDIRECT" in the last 72 hours. Thyroid function studies No results for input(s): "TSH", "T4TOTAL", "T3FREE", "THYROIDAB" in the last 72 hours.  Invalid input(s): "FREET3" Anemia work up Recent Labs    04/08/22 1800  FERRITIN 108  TIBC 218*  IRON 32   Urinalysis    Component Value Date/Time   COLORURINE YELLOW 03/27/2022 [place] 03/27/2022 1054   LABSPEC 1.009 03/27/2022 1054   PHURINE 7.0 03/27/2022 1054   GLUCOSEU NEGATIVE 03/27/2022 1054   HGBUR NEGATIVE 03/27/2022 1054   [place] 03/27/2022 1054   BILIRUBINUR negative 08/30/2020 [place] 03/27/2022 1054   PROTEINUR >=300 (A) 03/27/2022 1054   UROBILINOGEN 0.2 08/30/2020 1631   UROBILINOGEN 1.0 08/11/2008 1745   NITRITE NEGATIVE 03/27/2022 1054   LEUKOCYTESUR MODERATE (A) 03/27/2022 1054   Sepsis Labs Recent Labs  Lab 04/08/22 0008 04/09/22 0022 04/10/22 0549 04/11/22 0430  WBC 6.4 6.6 5.5 6.6   Microbiology Recent Results (from the past 240 hour(s))  Resp Panel by RT-PCR (Flu A&B, Covid) Anterior Nasal Swab     Status: None  Collection Time: 04/05/22  1:43 PM   Specimen: Anterior Nasal Swab  Result Value Ref Range Status   SARS Coronavirus 2 by RT PCR NEGATIVE NEGATIVE Final    Comment: (NOTE) SARS-CoV-2 target nucleic acids are NOT DETECTED.  The SARS-CoV-2 RNA is generally detectable in upper respiratory specimens during the acute phase of infection. The lowest concentration of SARS-CoV-2 viral copies this assay can detect is 138 copies/mL. A negative result does not preclude SARS-Cov-2 infection and should not be used as the sole basis for treatment or other patient management decisions. A negative result may occur with  improper specimen collection/handling, submission of specimen other than nasopharyngeal swab, presence of viral mutation(s) within the areas targeted by this assay, and inadequate number of  viral copies(<138 copies/mL). A negative result must be combined with clinical observations, patient history, and epidemiological information. The expected result is Negative.  Fact Sheet for Patients:  EntrepreneurPulse.com.au  Fact Sheet for Healthcare Providers:  IncredibleEmployment.be  This test is no t yet approved or cleared by the [place] FDA and  has been authorized for detection and/or diagnosis of SARS-CoV-2 by FDA under an Emergency Use Authorization (EUA). This EUA will remain  in effect (meaning this test can be used) for the duration of the COVID-19 declaration under Section 564(b)(1) of the Act, 21 U.S.C.section 360bbb-3(b)(1), unless the authorization is terminated  or revoked sooner.       Influenza A by PCR NEGATIVE NEGATIVE Final   Influenza B by PCR NEGATIVE NEGATIVE Final    Comment: (NOTE) The Xpert Xpress SARS-CoV-2/FLU/RSV plus assay is intended as an aid in the diagnosis of influenza from Nasopharyngeal swab specimens and should not be used as a sole basis for treatment. Nasal washings and aspirates are unacceptable for Xpert Xpress SARS-CoV-2/FLU/RSV testing.  Fact Sheet for Patients: EntrepreneurPulse.com.au  Fact Sheet for Healthcare Providers: IncredibleEmployment.be  This test is not yet approved or cleared by the [place] FDA and has been authorized for detection and/or diagnosis of SARS-CoV-2 by FDA under an Emergency Use Authorization (EUA). This EUA will remain in effect (meaning this test can be used) for the duration of the COVID-19 declaration under Section 564(b)(1) of the Act, 21 U.S.C. section 360bbb-3(b)(1), unless the authorization is terminated or revoked.  Performed at [facility], [place] [address], [place], [place] [address]   Surgical PCR screen     Status: None   Collection Time: 04/09/22  8:06 AM   Specimen: Nasal Mucosa; Nasal Swab   Result Value Ref Range Status   MRSA, PCR NEGATIVE NEGATIVE Final   Staphylococcus aureus NEGATIVE NEGATIVE Final    Comment: (NOTE) The Xpert SA Assay (FDA approved for NASAL specimens in patients 44 years of age and older), is one component of a comprehensive surveillance program. It is not intended to diagnose infection nor to guide or monitor treatment. Performed at [facility], [place] [address] [place], [place] [address]      Time coordinating discharge: Over 30 minutes  SIGNED:   Darliss Cheney, MD  [facility] 04/11/2022, 12:07 PM *Please note that this is a verbal dictation therefore any spelling or grammatical errors are due to the "[place]" system interpretation. If 7PM-7AM, please contact night-coverage [email or web address]

## 2022-04-11 NOTE — Progress Notes (Signed)
[facility] Progress Note  Name: Reneka Nebergall MRN: [id number] DOB: February 16, 1971  Chief Complaint:  Witnessed arrest  Subjective:  Strict ins/outs are still not available.  She had one unmeasured urine void over 9/6 and suspect ins/outs.   She feels good this am.  Breakfast went well and she hopes to go home soon.   Review of systems:    Denies shortness of breath  Denies chest pain States no nausea or vomiting today - tolerated breakfast -------------- Background on consult:  Brylin Stopper is a 51 y.o. female with a history of CKD stage, type 1 DM, CHF, and anxiety who presented to the hospital with after she collapsed at a local [employer].  An RN nearby started CPR and EMS arrived shortly thereafter.  AED placed and she received shock.  Per charting 6-7 minutes of CPR.  Recent admission at the end of August with hematemesis; EGD with esophagitis and active bleed at that time.  She had another recent admission with AKI 8/21-8/24.  Diuretics were held after her 9/2 torsemide dose.  Nephrology is consulted for assistance with management.  She has been given fluids.  Strict ins/outs are not available. She had one urine void yesterday and one charted today thus far.  She has been on fluids today and denies any difficulty with her breathing.  She states torsemide currently off because "sometimes I lose too much water".  She states that she follows with Dr. Carolin Sicks at [facility].    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   Intake/Output Summary (Last 24 hours) at 04/11/2022 0759 Last data filed at 04/10/2022 1950 Gross per 24 hour  Intake 240 ml  Output --  Net 240 ml    Vitals:  Vitals:   04/10/22 1948 04/10/22 2307 04/11/22 0330 04/11/22 0753  BP: (!) 146/80 (!) 151/82 118/68 134/69  Pulse: 89 91 80   Resp: (!) '22 18 13 ' (!) 21  Temp: 98.3 F (36.8 C) 98.3 F (36.8 C) 98.3 F (36.8 C)    TempSrc: Oral Oral Oral   SpO2: 100% 100% 94%      Physical Exam:     General:  adult female in bed in NAD HEENT: NCAT Eyes: EOMI sclera anicteric Neck: supple trachea midline Heart: S1S2 no rub Lungs: clear and unlabored; on room air  Abdomen: soft/nt/nd Extremities: no edema appreciated; no cyanosis or clubbing  Skin: no rash on extremities exposed Neuro: alert and oriented x 3 provides hx and follows commands; speaks softly  Psych normal mood and affect  Medications reviewed   Labs:     Latest Ref Rng & Units 04/11/2022    4:30 AM 04/10/2022    5:49 AM 04/09/2022   12:22 AM  BMP  Glucose 70 - 99 mg/dL 209  162  228   BUN 6 - 20 mg/dL 31  29  36   Creatinine 0.44 - 1.00 mg/dL 2.69  2.61  2.94   Sodium 135 - 145 mmol/L 138  140  140   Potassium 3.5 - 5.1 mmol/L 4.6  4.5  4.2   Chloride 98 - 111 mmol/L 110  114  111   CO2 22 - 32 mmol/L '22  22  22   ' Calcium 8.9 - 10.3 mg/dL 8.2  8.5  8.1      Assessment/Plan:   # AKI  - ischemic insults in the setting of arrest (arrest on 9/1).  May have some pre-renal  insults as well.  Renal US with some increased echogenicity; no hydro.  - improving with supportive care  - re-ordered strict ins/outs - would resume home torsemide 20 mg daily as needed for swelling    # CKD stage 3 - Baseline Cr 1.76 (EGFR 35) in April 2023 - Her more recent admission was previously charted as AKI and now compounded by further insult after ischemic event - she follows with Dr. Carolin Sicks at [facility]   # VF arrest  - supportive care per cardiology and primary team    # Chronic systolic CHF - can resume home torsemide 20 mg daily as needed on discharge    # Metabolic acidosis - mild and improved   # Anemia normocytic   - iron deficiency - IV iron x 2 doses - PRBC's per primary team; no acute need  Disposition per primary team.  I will ensure that when she is discharged she has follow-up with our office in 2 weeks  Claudia Desanctis,  MD 04/11/2022 8:08 AM

## 2022-04-11 NOTE — Progress Notes (Signed)
Mobility Specialist Progress Note    04/11/22 0952  Mobility  Activity Ambulated independently in hallway  Level of Assistance Independent  Assistive Device None  Distance Ambulated (ft) 420 ft  Activity Response Tolerated well  $Mobility charge 1 Mobility   Pre-Mobility: 87 HR Post-Mobility: 100 HR  Pt received in chair and agreeable. No complaints on walk. Returned to chair with call bell in reach.    Hildred Alamin Mobility Specialist

## 2022-04-11 NOTE — Progress Notes (Signed)
Rounding Note    Patient Name: [name] Date of Encounter: 04/11/2022  [place] Cardiologist: Sinclair Grooms, MD   Subjective   Feeling much better today. ICD site c/d/I. No SOB, chest pain, LE edema.  Inpatient Medications    Scheduled Meds:  carvedilol  12.5 mg Oral BID   docusate sodium  100 mg Oral BID   feeding supplement (GLUCERNA SHAKE)  237 mL Oral TID BM   hydrOXYzine  50 mg Oral TID   insulin aspart  0-9 Units Subcutaneous TID WC   insulin aspart  3 Units Subcutaneous TID WC   insulin detemir  14 Units Subcutaneous Daily   isosorbide-hydrALAZINE  1 tablet Oral TID   metoCLOPramide (REGLAN) injection  10 mg Intravenous TID AC & HS   multivitamin with minerals  1 tablet Oral Daily   pantoprazole  40 mg Oral Daily   rosuvastatin  20 mg Oral Daily   sodium chloride flush  3 mL Intravenous Q12H   Continuous Infusions:  ferric gluconate (FERRLECIT) IVPB 250 mg (04/09/22 1140)   PRN Meds: acetaminophen, albuterol, bisacodyl, hydrALAZINE, ondansetron (ZOFRAN) IV, oxyCODONE, polyethylene glycol, pregabalin, promethazine (PHENERGAN) injection (IM or IVPB)   Vital Signs    Vitals:   04/10/22 1948 04/10/22 2307 04/11/22 0330 04/11/22 0753  BP: (!) 146/80 (!) 151/82 118/68 134/69  Pulse: 89 91 80 82  Resp: (!) $RemoveB'22 18 13 18  'XNiMvzhx$ Temp: 98.3 F (36.8 C) 98.3 F (36.8 C) 98.3 F (36.8 C) 98.2 F (36.8 C)  TempSrc: Oral Oral Oral Oral  SpO2: 100% 100% 94% 94%    Intake/Output Summary (Last 24 hours) at 04/11/2022 0830 Last data filed at 04/11/2022 0800 Gross per 24 hour  Intake 240 ml  Output --  Net 240 ml      04/03/2022   11:52 AM 04/02/2022    1:36 PM 04/01/2022    6:20 PM  Last 3 Weights  Weight (lbs) 180 lb 11.2 oz 184 lb 8.4 oz 184 lb 9.6 oz  Weight (kg) 81.965 kg 83.7 kg 83.734 kg      Telemetry    NSR - Personally Reviewed  ECG    No new ecg - Personally Reviewed  Physical Exam   GEN: No acute distress.   Neck: No  JVD Cardiac: RRR, 2/6 systolic murmur. No rubs, or gallops. ICD site c/d/i Respiratory: Clear to auscultation bilaterally. GI: Soft, nontender, non-distended  MS: No edema; No deformity. Neuro:  Nonfocal  Psych: Normal affect   Labs    High Sensitivity Troponin:   Recent Labs  Lab 04/05/22 1406 04/05/22 1534 04/05/22 1911 04/05/22 2029 04/06/22 0615  TROPONINIHS 38* 78* 155* 139* 7     Chemistry Recent Labs  Lab 04/08/22 0008 04/09/22 0022 04/10/22 0549 04/11/22 0430  NA 137 140 140 138  K 4.6 4.2 4.5 4.6  CL 109 111 114* 110  CO2 19* $Remov'22 22 22  'vQunKo$ GLUCOSE 216* 228* 162* 209*  BUN 41* 36* 29* 31*  CREATININE 3.42* 2.94* 2.61* 2.69*  CALCIUM 8.1* 8.1* 8.5* 8.2*  MG 2.1 2.0 1.9  --   PROT 4.8* 4.7* 5.1* 4.9*  ALBUMIN 2.1* 2.0* 2.2* 2.1*  AST 10* 10* 11* 11*  ALT $Re'9 8 10 10  'XcH$ ALKPHOS 54 56 56 56  BILITOT 0.3 0.6 0.7 0.5  GFRNONAA 16* 19* 22* 21*  ANIONGAP 9 7 4* 6    Lipids No results for input(s): "CHOL", "TRIG", "HDL", "LABVLDL", "LDLCALC", "CHOLHDL" in the last  168 hours.  Hematology Recent Labs  Lab 04/09/22 0022 04/10/22 0549 04/11/22 0430  WBC 6.6 5.5 6.6  RBC 2.75* 2.85* 2.62*  HGB 7.8* 8.0* 7.5*  HCT 23.4* 24.5* 22.4*  MCV 85.1 86.0 85.5  MCH 28.4 28.1 28.6  MCHC 33.3 32.7 33.5  RDW 14.1 14.1 14.2  PLT 247 242 204   Thyroid No results for input(s): "TSH", "FREET4" in the last 168 hours.  BNPNo results for input(s): "BNP", "PROBNP" in the last 168 hours.  DDimer No results for input(s): "DDIMER" in the last 168 hours.   Radiology    DG Chest 2 View  Result Date: 04/10/2022 CLINICAL DATA:  51 year old female status post cardiac device placement. EXAM: CHEST - 2 VIEW COMPARISON:  04/07/2022 FINDINGS: The mediastinal contours are within normal limits. Unchanged cardiomegaly. Interval placement of left chest wall, subclavian vein approach single lead pacer AICD with the lead tip projecting over the expected location of the right ventricle. Mild blunting  of the costophrenic angles bilaterally with mild bibasilar hazy opacities. Mild cephalization of the pulmonary vasculature. No pneumothorax. No acute osseous abnormality. IMPRESSION: 1. Status post insertion of left subclavian vein approach single lead pacemaker/AICD with leads in expected location. 2. Mild pulmonary edema and associated trace bilateral pleural effusions. 3. Unchanged cardiomegaly. Electronically Signed   By: Ruthann Cancer M.D.   On: 04/10/2022 08:06   EP PPM/ICD IMPLANT  Result Date: 04/09/2022 SURGEON:  Allegra Lai, MD    PREPROCEDURE DIAGNOSES:  1. Cardiac arrest due to VT/VF     POSTPROCEDURE DIAGNOSES:  1. Cardiac arrest due to VT/VF    PROCEDURES:   1. ICD implantation.  INTRODUCTION:  [name] is a 51 y.o. female with an out of hospital cardiac arrest. No cause has been found. Presents to the lab today for ICD implant    DESCRIPTION OF PROCEDURE:  Informed written consent was obtained and the patient was brought to the electrophysiology lab in the fasting state. The patient was adequately sedated with intravenous Versed, and fentanyl as outlined in the nursing report.  The patient's left chest was prepped and draped in the usual sterile fashion by the EP lab staff.  The skin overlying the left deltopectoral region was infiltrated with lidocaine for local analgesia.  A 5-cm incision was made over the left deltopectoral region.  A left subcutaneous defibrillator pocket was fashioned using a combination of sharp and blunt dissection.  Electrocautery was used to assure hemostasis. RA/RV Lead Placement: The left axillary vein was cannulated with fluoroscopic visualization.  Through the left axillary vein, a Medtronic Sprint Quattro Secure [id number] (serial number [id number]) right ventricular defibrillator lead were advanced with fluoroscopic visualization into the right ventricular apex positions.  Initial right ventricular lead R-wave measured 7.2 mV with impedance of 510 ohms and a  threshold of 0.4 volts at 0.5 milliseconds. The leads were secured to the pectoralis  fascia using #2 silk suture over the suture sleeves.  The pocket then  irrigated with copious gentamicin solution.  The leads were then  connected to a Medtronic Visia AF MRI DVFB1D1  (serial  Number [id number]) ICD.  The defibrillator was placed into the  pocket.  The pocket was then closed in 3 layers with 2.0 Vicryl suture for the subcutaneous and 3.0 Vicryl suture subcuticular layers. EBL<42ml.  Steri-Strips and a  sterile dressing were then applied.  CONCLUSIONS:  1. Cardiac arrest due to VT/VF  2. Successful ICD implantation.  3. No early apparent complications. [employer],  MD 04/09/2022 4:49 PM    Cardiac Studies   TTE 04/07/22: IMPRESSIONS   1. Left ventricular ejection fraction, by estimation, is 50 to 55%. The  left ventricle has low normal function. The left ventricle has no regional  wall motion abnormalities. There is mild left ventricular hypertrophy.  Left ventricular diastolic  parameters are consistent with Grade II diastolic dysfunction  (pseudonormalization).   2. Right ventricular systolic function is normal. The right ventricular  size is normal. There is mildly elevated pulmonary artery systolic  pressure.   3. Left atrial size was severely dilated.   4. The mitral valve is normal in structure. No evidence of mitral valve  regurgitation. No evidence of mitral stenosis.   5. The aortic valve is normal in structure. Aortic valve regurgitation is  not visualized. No aortic stenosis is present.   6. The inferior vena cava is dilated in size with >50% respiratory  variability, suggesting right atrial pressure of 8 mmHg.   Comparison(s): Prior images reviewed side by side. The left ventricular  function has improved.  Patient Profile     51 y.o. female with history of DMII with gastroparesis, CKD stage IIIB, nonischemic cardiomyopathy and chronic diastolic HF with LVEF 46-96% who  presented after VF arrest with successful defibrillation prior to arrival to [facility] s/p ICD placement. Cardiology is following for VF and systolic heart failure.   Assessment & Plan    #VF Arrest: Patient presented after witnessed arrest at [facility] with bystander CPR and AED shock in the field with successful return of ROSC. TTE 09/03 with LVEF 50-55%, no significant valve disease. CTA in 2021 with no CAD and Ca score 0. ECG here without acute ischemic changes and trop flat. Did not repeat ischemic evaluation given recent reassuring imaging and AKI on CKD. She is now s/p ICD placement with EP. No recurrent VT on the monitor. -S/p ICD placement with EP -Continue coreg 12.5mg  BID -Will arrange for CV follow-up  #Chronic Systolic HF with Improved EF: Patient presented on this admission with witnessed arrest at [facility] s/p bystander CPR with AED shock for presumed VF/VT. TTE on this admission with LVEF 50-55%, G2DD, normal RV, no significant valve disease. Was previously 40-45% in 08/2020. Currently, she is compensated on exam and euvolemic. GDMT has been limited due to CKD III-IV. Notably, had CTA in 2021 which showed no significant CAD with Ca score 0.  -Continue coreg 12.5mg  BID -Continue bidil 20-37.5mg  TID -Not able to tolerate ARB/ARNI/Spiro due to AKI on CKD -S/p ICD placement for secondary prevention as above  #HTN: -Continue coreg 12.5mg  BID -Continue bidil 20-37.5mg  TID  #DMII with gastroparesis: -Management per primary  #AKI on CKD IIIB: Likely secondary to cardiac arrest. -Nephrology following  Cardiology will sign-off. Will arrange for CV follow-up.   For questions or updates, please contact [place] Please consult [email or web address] for contact info under        Signed, Freada Bergeron, MD  04/11/2022, 8:30 AM

## 2022-04-11 NOTE — Progress Notes (Signed)
Inpatient Diabetes Program Recommendations  AACE/ADA: New Consensus Statement on Inpatient Glycemic Control (2015)  Target Ranges:  Prepandial:   less than 140 mg/dL      Peak postprandial:   less than 180 mg/dL (1-2 hours)      Critically ill patients:  140 - 180 mg/dL   Lab Results  Component Value Date   GLUCAP 226 (H) 04/11/2022   HGBA1C 6.3 (H) 03/25/2022    Review of Glycemic Control  Latest Reference Range & Units 04/10/22 06:30 04/10/22 13:30 04/10/22 17:55 04/10/22 21:43 04/11/22 06:23 04/11/22 11:35  Glucose-Capillary 70 - 99 mg/dL 173 (H) 308 (H) 229 (H) 184 (H) 230 (H) 226 (H)   Diabetes history: DM 1 Outpatient Diabetes medications:  Omnipod (uses approx. 50 units daily),  Tresiba 50 units daily (when off pump) Current orders for Inpatient glycemic control:  Novolog 0-9 units tid with meals Levemir 12 units daily Inpatient Diabetes Program Recommendations:    Note that patient being discharged today.  Called patient prior to d/c and reminded her that her last dose of Levemir was yesterday at 1335 and therefore she can restart insulin pump when she gets home.  Reminded her that her insulin pump settings may need to be adjusted  (possibly reduced) and the importance of preventing low blood sugars.  She verbalized understanding.  She plans to f/u with Dr. Buddy Duty.   Thanks,  Adah Perl, RN, BC-ADM Inpatient Diabetes Coordinator Pager [phone]  (8a-5p)

## 2022-04-11 NOTE — Progress Notes (Signed)
Discharge instructions reviewed with pt. Copy of instructions given to pt, informed of scripts sent to her pharmacy.  Pt waiting for work note from MD.   At 1245 Pt d/c'd via wheelchair with belongings, with family waiting at main entrance.            Escorted by  staff.

## 2022-04-15 DIAGNOSIS — N184 Chronic kidney disease, stage 4 (severe): Secondary | ICD-10-CM | POA: Diagnosis not present

## 2022-04-15 DIAGNOSIS — K3184 Gastroparesis: Secondary | ICD-10-CM | POA: Diagnosis not present

## 2022-04-15 DIAGNOSIS — E875 Hyperkalemia: Secondary | ICD-10-CM | POA: Diagnosis not present

## 2022-04-15 DIAGNOSIS — I10 Essential (primary) hypertension: Secondary | ICD-10-CM | POA: Diagnosis not present

## 2022-04-15 DIAGNOSIS — E1140 Type 2 diabetes mellitus with diabetic neuropathy, unspecified: Secondary | ICD-10-CM | POA: Diagnosis not present

## 2022-04-15 DIAGNOSIS — I5032 Chronic diastolic (congestive) heart failure: Secondary | ICD-10-CM | POA: Diagnosis not present

## 2022-04-18 DIAGNOSIS — R809 Proteinuria, unspecified: Secondary | ICD-10-CM | POA: Diagnosis not present

## 2022-04-18 DIAGNOSIS — N1832 Chronic kidney disease, stage 3b: Secondary | ICD-10-CM | POA: Diagnosis not present

## 2022-04-18 DIAGNOSIS — N189 Chronic kidney disease, unspecified: Secondary | ICD-10-CM | POA: Diagnosis not present

## 2022-04-18 DIAGNOSIS — E1029 Type 1 diabetes mellitus with other diabetic kidney complication: Secondary | ICD-10-CM | POA: Diagnosis not present

## 2022-04-18 DIAGNOSIS — I10 Essential (primary) hypertension: Secondary | ICD-10-CM | POA: Diagnosis not present

## 2022-04-18 NOTE — Progress Notes (Unsigned)
Electrophysiology Office Note Date: 04/18/2022  ID:  Pattie Flaharty, DOB 1971-06-28, MRN [id number]  PCP: [name], PA Primary Cardiologist: Sinclair Grooms, MD Electrophysiologist: Melida Quitter, MD   CC: Routine ICD follow-up  Allex Madia is a 51 y.o. female seen today for Melida Quitter, MD for post hospital follow up.    Admitted 9/1 -04/11/2022 after witnessed arrest at a restaurant, for which AED advised shock.  Admission complicated by on-going nausea and vomiting with gastroparesis and recent esophagitis. Echo 04/07/2022 LVEF 50-55%, severe LAE, mildly elevated PASP Underwent single chamber ICD 04/09/2022  Since discharge from hospital the patient reports doing about the same. Main issues are with gastroparesis. Medications aren't helping much, still with frequent NV. Has intermittent dizziness, with and without activity. No chest pain, undue SOB, or syncope.    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/09/2022 for aborted cardiac arrest.   Past Medical History:  Diagnosis Date   Anemia    Anxiety    Arthritis    Asthma    CHF (congestive heart failure) (HCC)    Depression    Diabetic ulcer of left foot ([place]) 05/12/2013   Generalized abdominal pain    History of chicken pox    Loss of weight 12/02/2019   Migraines    Mood disorder (HCC)    anxiety   Prurigo nodularis    with diabetic dermopathy   Type 1 diabetes, uncontrolled, with neuropathy    [place]   Ulcers of both lower legs ([place]) 02/20/2014   Past Surgical History:  Procedure Laterality Date   BIOPSY  08/11/2020   Procedure: BIOPSY;  Surgeon: Ladene Artist, MD;  Location: [facility];  Service: Endoscopy;;   BIOPSY  04/02/2022   Procedure: BIOPSY;  Surgeon: Jackquline Denmark, MD;  Location: [facility];  Service: Gastroenterology;;   ESOPHAGOGASTRODUODENOSCOPY (EGD) WITH PROPOFOL N/A 08/11/2020   Procedure: ESOPHAGOGASTRODUODENOSCOPY (EGD) WITH PROPOFOL;  Surgeon: Ladene Artist, MD;  Location: [facility];  Service: Endoscopy;  Laterality: N/A;   ESOPHAGOGASTRODUODENOSCOPY (EGD) WITH PROPOFOL N/A 04/02/2022   Procedure: ESOPHAGOGASTRODUODENOSCOPY (EGD) WITH PROPOFOL;  Surgeon: Jackquline Denmark, MD;  Location: [facility];  Service: Gastroenterology;  Laterality: N/A;   ICD IMPLANT N/A 04/09/2022   Procedure: ICD IMPLANT;  Surgeon: Constance Haw, MD;  Location: [facility];  Service: Cardiovascular;  Laterality: N/A;   treadmill stress test  01/2013   WNL, low risk study    Current Outpatient Medications  Medication Sig Dispense Refill   acetaminophen (TYLENOL) 500 MG tablet Take 500 mg by mouth every 6 (six) hours as needed for moderate pain.     albuterol (PROAIR HFA) 108 (90 Base) MCG/ACT inhaler Inhale 2 puffs into the lungs every 6 (six) hours as needed for wheezing or shortness of breath. 6.7 g 1   Carboxymethylcellul-Glycerin (LUBRICATING EYE DROPS OP) Place 1 drop into both eyes daily as needed (dry eyes).     carvedilol (COREG) 12.5 MG tablet Take 1 tablet (12.5 mg total) by mouth 2 (two) times daily.     Continuous Blood Gluc Sensor (FREESTYLE LIBRE 2 SENSOR) MISC CHANGE EVERY 14 DAYS     hydrOXYzine (ATARAX) 50 MG tablet Take 50 mg by mouth in the morning, at noon, and at bedtime.     Insulin Disposable Pump (OMNIPOD 10 PACK) MISC by Does not apply route.     isosorbide-hydrALAZINE (BIDIL) 20-37.5 MG tablet Take 1 tablet by mouth 3 (three) times daily. [place]  tablet 0   metoCLOPramide (REGLAN) 10 MG tablet Take 1 tablet (10 mg total) by mouth 3 (three) times daily before meals for 15 days. 45 tablet 0   NOVOLOG 100 UNIT/ML injection Inject 0-120 Units into the skin daily.     pantoprazole (PROTONIX) 40 MG tablet Take 1 tablet (40 mg total) by mouth daily. 60 tablet 0   polyethylene glycol (MIRALAX / GLYCOLAX) 17 g packet Take 17 g by mouth daily as needed for moderate constipation.     pregabalin (LYRICA) 50 MG capsule Take 50 mg by mouth 2  (two) times daily as needed (neuropathy).     promethazine (PHENERGAN) 12.5 MG tablet Take 1 tablet (12.5 mg total) by mouth every 6 (six) hours as needed for nausea or vomiting. 30 tablet 0   rosuvastatin (CRESTOR) 20 MG tablet Take 1 tablet (20 mg total) by mouth daily. 30 tablet 0   tobramycin (TOBREX) 0.3 % ophthalmic solution Place 1-2 drops into both eyes See admin instructions. Instill 1-2 drops into both eyes 3 times daily the day before, day of, and the day after monthly eye injections     torsemide (DEMADEX) 20 MG tablet Take 20 mg by mouth daily as needed (swelling).     TRESIBA FLEXTOUCH 200 UNIT/ML FlexTouch Pen Inject 50 Units into the skin daily as needed (when insulin pump stops working).     No current facility-administered medications for this visit.    Allergies:   Amoxicillin, Atorvastatin, Dulaglutide, Lantus [insulin glargine], and Miconazole nitrate   Social History: Social History   Socioeconomic History   Marital status: Divorced    Spouse name: Not on file   Number of children: 2   Years of education: Not on file   Highest education level: Not on file  Occupational History   Occupation: Freight forwarder  Tobacco Use   Smoking status: Never   Smokeless tobacco: Never  Vaping Use   Vaping Use: Never used  Substance and Sexual Activity   Alcohol use: Not Currently   Drug use: No   Sexual activity: Yes    Partners: Male    Birth control/protection: None  Other Topics Concern   Not on file  Social History Narrative   Caffeine use: daily   Occupation: cleans houses   Regular exercise: yes, active 5x/wk   Diet: good water, fruits/vegetables daily   Social Determinants of Health   Financial Resource Strain: Not on file  Food Insecurity: Not on file  Transportation Needs: Not on file  Physical Activity: Not on file  Stress: Not on file  Social Connections: Not on file  Intimate Partner Violence: Not on file    Family History: Family History  Problem  Relation Age of Onset   Diabetes Mother        type 2   Hypertension Mother    Thyroid disease Mother    Bipolar disorder Mother    Heart disease Mother    Calcium disorder Mother    Cancer Maternal Grandmother        Breast, stomach   Cancer Paternal Grandmother        stomach, lung (smoker)   Diabetes Paternal Grandmother    Diabetes Paternal Grandfather    Heart failure Sister    Diabetes Sister    Stroke Maternal Aunt    Cancer Maternal Uncle        prostate   CAD Maternal Aunt        stents   Cancer Maternal Aunt 64  ovarian    Review of Systems: All other systems reviewed and are otherwise negative except as noted above.   Physical Exam: Vitals:   04/19/22 1213  BP: 122/78  Pulse: 91  SpO2: 97%  Weight: 175 lb 9.6 oz (79.7 kg)  Height: 6' (1.829 m)     GEN- The patient is well appearing, alert and oriented x 3 today.   HEENT: normocephalic, atraumatic; sclera clear, conjunctiva pink; hearing intact; oropharynx clear; neck supple, no JVP Lymph- no cervical lymphadenopathy Lungs- Clear to ausculation bilaterally, normal work of breathing.  No wheezes, rales, rhonchi Heart- Regular  rate and rhythm, no murmurs, rubs or gallops, PMI not laterally displaced GI- soft, non-tender, non-distended, bowel sounds present, no hepatosplenomegaly Extremities- no clubbing or cyanosis. No peripheral edema; DP/PT/radial pulses 2+ bilaterally MS- no significant deformity or atrophy Skin- warm and dry, no rash or lesion; ICD pocket well healed Psych- euthymic mood, full affect Neuro- strength and sensation are intact  ICD interrogation- reviewed in detail today,  See PACEART report  EKG:  EKG is not ordered today.   Recent Labs: 11/14/2021: TSH 1.680 03/25/2022: B Natriuretic Peptide 709.8 04/10/2022: Magnesium 1.9 04/11/2022: ALT 10; BUN 31; Creatinine, Ser 2.69; Hemoglobin 7.5; Platelets 204; Potassium 4.6; Sodium 138   Wt Readings from Last 3 Encounters:  04/03/22  180 lb 11.2 oz (82 kg)  03/28/22 184 lb 9.6 oz (83.7 kg)  12/14/21 191 lb 6.4 oz (86.8 kg)     Other studies Reviewed: Additional studies/ records that were reviewed today include: Previous EP office notes.   Assessment and Plan:  1.  Aborted VF arrest s/p Medtronic single chamber ICD  euvolemic today Stable on an appropriate medical regimen Normal ICD function See [facility] report No changes today  2. Chronic systolic CHF Echo "low normal" 50-55% this admission, previously as low as 40-45% GDMT limited by CKD IV Ca score of 0 in 01/2020 Previously failed Entresto with hypotension.  3. Dizziness Labile BP Change coreg to Toprol for less BP effect.   Current medicines are reviewed at length with the patient today.    Disposition:   Follow up with Dr. Myles Gip in 3 months    Signed, [name], PA-C  04/18/2022 1:26 PM  [place] [address] [phone] (office) [phone] (fax)

## 2022-04-19 ENCOUNTER — Ambulatory Visit: Payer: BC Managed Care – PPO | Attending: Student | Admitting: Student

## 2022-04-19 ENCOUNTER — Encounter: Payer: Self-pay | Admitting: Student

## 2022-04-19 VITALS — BP 122/78 | HR 91 | Ht 72.0 in | Wt 175.6 lb

## 2022-04-19 DIAGNOSIS — I469 Cardiac arrest, cause unspecified: Secondary | ICD-10-CM

## 2022-04-19 DIAGNOSIS — I429 Cardiomyopathy, unspecified: Secondary | ICD-10-CM | POA: Diagnosis not present

## 2022-04-19 LAB — CUP PACEART INCLINIC DEVICE CHECK
Battery Remaining Longevity: 134 mo
Battery Voltage: 3.05 V
Brady Statistic RV Percent Paced: 0 %
Date Time Interrogation Session: 20230915123136
HighPow Impedance: 50 Ohm
Implantable Lead Implant Date: 20230905
Implantable Lead Location: 753860
Implantable Pulse Generator Implant Date: 20230905
Lead Channel Impedance Value: 285 Ohm
Lead Channel Impedance Value: 342 Ohm
Lead Channel Pacing Threshold Amplitude: 0.625 V
Lead Channel Pacing Threshold Pulse Width: 0.4 ms
Lead Channel Sensing Intrinsic Amplitude: 3.75 mV
Lead Channel Sensing Intrinsic Amplitude: 4.375 mV
Lead Channel Setting Pacing Amplitude: 3.5 V
Lead Channel Setting Pacing Pulse Width: 0.4 ms
Lead Channel Setting Sensing Sensitivity: 0.3 mV

## 2022-04-19 MED ORDER — METOPROLOL SUCCINATE ER 25 MG PO TB24: 25.0000 mg | ORAL_TABLET | Freq: Every day | ORAL | 3 refills | Status: DC

## 2022-04-19 NOTE — Patient Instructions (Signed)
Medication Instructions:  Your physician has recommended you make the following change in your medication:   DISCONTINUE: Carvedilol START: Metoprolol Succinate 25mg  daily  *If you need a refill on your cardiac medications before your next appointment, please call your pharmacy*   Lab Work: None If you have labs (blood work) drawn today and your tests are completely normal, you will receive your results only by: [place] (if you have MyChart) OR A paper copy in the mail If you have any lab test that is abnormal or we need to change your treatment, we will call you to review the results.   Follow-Up: At [facility], you and your health needs are our priority.  As part of our continuing mission to provide you with exceptional heart care, we have created designated Provider Care Teams.  These Care Teams include your primary Cardiologist (physician) and Advanced Practice Providers (APPs -  Physician Assistants and Nurse Practitioners) who all work together to provide you with the care you need, when you need it.  Your next appointment:   As scheduled

## 2022-04-21 ENCOUNTER — Emergency Department (HOSPITAL_BASED_OUTPATIENT_CLINIC_OR_DEPARTMENT_OTHER)
Admission: EM | Admit: 2022-04-21 | Discharge: 2022-04-21 | Disposition: A | Discharge status: Home / Self Care | Payer: BC Managed Care – PPO | Source: Home / Self Care | Attending: Emergency Medicine | Admitting: Emergency Medicine

## 2022-04-21 ENCOUNTER — Emergency Department (HOSPITAL_BASED_OUTPATIENT_CLINIC_OR_DEPARTMENT_OTHER): Payer: BC Managed Care – PPO

## 2022-04-21 ENCOUNTER — Encounter (HOSPITAL_BASED_OUTPATIENT_CLINIC_OR_DEPARTMENT_OTHER): Payer: Self-pay

## 2022-04-21 ENCOUNTER — Other Ambulatory Visit: Payer: Self-pay

## 2022-04-21 DIAGNOSIS — Z888 Allergy status to other drugs, medicaments and biological substances status: Secondary | ICD-10-CM | POA: Diagnosis not present

## 2022-04-21 DIAGNOSIS — Z794 Long term (current) use of insulin: Secondary | ICD-10-CM | POA: Diagnosis not present

## 2022-04-21 DIAGNOSIS — I5022 Chronic systolic (congestive) heart failure: Secondary | ICD-10-CM | POA: Diagnosis not present

## 2022-04-21 DIAGNOSIS — E870 Hyperosmolality and hypernatremia: Secondary | ICD-10-CM | POA: Diagnosis not present

## 2022-04-21 DIAGNOSIS — R1114 Bilious vomiting: Secondary | ICD-10-CM | POA: Diagnosis not present

## 2022-04-21 DIAGNOSIS — Z833 Family history of diabetes mellitus: Secondary | ICD-10-CM | POA: Diagnosis not present

## 2022-04-21 DIAGNOSIS — D573 Sickle-cell trait: Secondary | ICD-10-CM | POA: Diagnosis not present

## 2022-04-21 DIAGNOSIS — I5042 Chronic combined systolic (congestive) and diastolic (congestive) heart failure: Secondary | ICD-10-CM | POA: Diagnosis not present

## 2022-04-21 DIAGNOSIS — I5032 Chronic diastolic (congestive) heart failure: Secondary | ICD-10-CM | POA: Diagnosis not present

## 2022-04-21 DIAGNOSIS — N189 Chronic kidney disease, unspecified: Secondary | ICD-10-CM | POA: Diagnosis not present

## 2022-04-21 DIAGNOSIS — K3184 Gastroparesis: Secondary | ICD-10-CM | POA: Insufficient documentation

## 2022-04-21 DIAGNOSIS — I130 Hypertensive heart and chronic kidney disease with heart failure and stage 1 through stage 4 chronic kidney disease, or unspecified chronic kidney disease: Secondary | ICD-10-CM | POA: Diagnosis not present

## 2022-04-21 DIAGNOSIS — K2100 Gastro-esophageal reflux disease with esophagitis, without bleeding: Secondary | ICD-10-CM | POA: Diagnosis not present

## 2022-04-21 DIAGNOSIS — E109 Type 1 diabetes mellitus without complications: Secondary | ICD-10-CM | POA: Insufficient documentation

## 2022-04-21 DIAGNOSIS — Z95810 Presence of automatic (implantable) cardiac defibrillator: Secondary | ICD-10-CM | POA: Diagnosis not present

## 2022-04-21 DIAGNOSIS — K920 Hematemesis: Secondary | ICD-10-CM

## 2022-04-21 DIAGNOSIS — R112 Nausea with vomiting, unspecified: Secondary | ICD-10-CM | POA: Diagnosis not present

## 2022-04-21 DIAGNOSIS — I10 Essential (primary) hypertension: Secondary | ICD-10-CM | POA: Diagnosis not present

## 2022-04-21 DIAGNOSIS — I509 Heart failure, unspecified: Secondary | ICD-10-CM | POA: Insufficient documentation

## 2022-04-21 DIAGNOSIS — Z79899 Other long term (current) drug therapy: Secondary | ICD-10-CM | POA: Diagnosis not present

## 2022-04-21 DIAGNOSIS — Z880 Allergy status to penicillin: Secondary | ICD-10-CM | POA: Diagnosis not present

## 2022-04-21 DIAGNOSIS — I129 Hypertensive chronic kidney disease with stage 1 through stage 4 chronic kidney disease, or unspecified chronic kidney disease: Secondary | ICD-10-CM | POA: Diagnosis not present

## 2022-04-21 DIAGNOSIS — N184 Chronic kidney disease, stage 4 (severe): Secondary | ICD-10-CM | POA: Diagnosis not present

## 2022-04-21 DIAGNOSIS — E860 Dehydration: Secondary | ICD-10-CM | POA: Diagnosis not present

## 2022-04-21 DIAGNOSIS — N183 Chronic kidney disease, stage 3 unspecified: Secondary | ICD-10-CM | POA: Diagnosis not present

## 2022-04-21 DIAGNOSIS — E1022 Type 1 diabetes mellitus with diabetic chronic kidney disease: Secondary | ICD-10-CM | POA: Diagnosis not present

## 2022-04-21 DIAGNOSIS — J45909 Unspecified asthma, uncomplicated: Secondary | ICD-10-CM | POA: Diagnosis not present

## 2022-04-21 DIAGNOSIS — N179 Acute kidney failure, unspecified: Secondary | ICD-10-CM | POA: Diagnosis not present

## 2022-04-21 DIAGNOSIS — R188 Other ascites: Secondary | ICD-10-CM | POA: Diagnosis not present

## 2022-04-21 DIAGNOSIS — D631 Anemia in chronic kidney disease: Secondary | ICD-10-CM | POA: Diagnosis not present

## 2022-04-21 DIAGNOSIS — R1013 Epigastric pain: Secondary | ICD-10-CM | POA: Diagnosis not present

## 2022-04-21 DIAGNOSIS — Z20822 Contact with and (suspected) exposure to covid-19: Secondary | ICD-10-CM | POA: Insufficient documentation

## 2022-04-21 DIAGNOSIS — Z9641 Presence of insulin pump (external) (internal): Secondary | ICD-10-CM | POA: Diagnosis present

## 2022-04-21 DIAGNOSIS — K209 Esophagitis, unspecified without bleeding: Secondary | ICD-10-CM

## 2022-04-21 DIAGNOSIS — Z8249 Family history of ischemic heart disease and other diseases of the circulatory system: Secondary | ICD-10-CM | POA: Diagnosis not present

## 2022-04-21 DIAGNOSIS — R000 Tachycardia, unspecified: Secondary | ICD-10-CM | POA: Insufficient documentation

## 2022-04-21 DIAGNOSIS — E1140 Type 2 diabetes mellitus with diabetic neuropathy, unspecified: Secondary | ICD-10-CM | POA: Diagnosis not present

## 2022-04-21 DIAGNOSIS — R1110 Vomiting, unspecified: Secondary | ICD-10-CM | POA: Diagnosis not present

## 2022-04-21 DIAGNOSIS — N1832 Chronic kidney disease, stage 3b: Secondary | ICD-10-CM | POA: Diagnosis not present

## 2022-04-21 DIAGNOSIS — E1040 Type 1 diabetes mellitus with diabetic neuropathy, unspecified: Secondary | ICD-10-CM | POA: Diagnosis not present

## 2022-04-21 DIAGNOSIS — E1043 Type 1 diabetes mellitus with diabetic autonomic (poly)neuropathy: Secondary | ICD-10-CM | POA: Diagnosis not present

## 2022-04-21 DIAGNOSIS — R079 Chest pain, unspecified: Secondary | ICD-10-CM | POA: Diagnosis not present

## 2022-04-21 DIAGNOSIS — J9811 Atelectasis: Secondary | ICD-10-CM | POA: Diagnosis not present

## 2022-04-21 DIAGNOSIS — Z8674 Personal history of sudden cardiac arrest: Secondary | ICD-10-CM | POA: Diagnosis not present

## 2022-04-21 LAB — COMPREHENSIVE METABOLIC PANEL
ALT: 11 U/L (ref 0–44)
AST: 11 U/L — ABNORMAL LOW (ref 15–41)
Albumin: 3.6 g/dL (ref 3.5–5.0)
Alkaline Phosphatase: 71 U/L (ref 38–126)
Anion gap: 10 (ref 5–15)
BUN: 32 mg/dL — ABNORMAL HIGH (ref 6–20)
CO2: 23 mmol/L (ref 22–32)
Calcium: 8.7 mg/dL — ABNORMAL LOW (ref 8.9–10.3)
Chloride: 110 mmol/L (ref 98–111)
Creatinine, Ser: 2.22 mg/dL — ABNORMAL HIGH (ref 0.44–1.00)
GFR, Estimated: 26 mL/min — ABNORMAL LOW (ref >60)
Glucose, Bld: 132 mg/dL — ABNORMAL HIGH (ref 70–99)
Potassium: 4.5 mmol/L (ref 3.5–5.1)
Sodium: 143 mmol/L (ref 135–145)
Total Bilirubin: 0.5 mg/dL (ref 0.3–1.2)
Total Protein: 6.7 g/dL (ref 6.5–8.1)

## 2022-04-21 LAB — CBC
HCT: 28.3 % — ABNORMAL LOW (ref 36.0–46.0)
Hemoglobin: 9.3 g/dL — ABNORMAL LOW (ref 12.0–15.0)
MCH: 28.2 pg (ref 26.0–34.0)
MCHC: 32.9 g/dL (ref 30.0–36.0)
MCV: 85.8 fL (ref 80.0–100.0)
Platelets: 246 10*3/uL (ref 150–400)
RBC: 3.3 MIL/uL — ABNORMAL LOW (ref 3.87–5.11)
RDW: 14.1 % (ref 11.5–15.5)
WBC: 6 10*3/uL (ref 4.0–10.5)
nRBC: 0 % (ref 0.0–0.2)

## 2022-04-21 LAB — PROTIME-INR
INR: 0.9 (ref 0.8–1.2)
Prothrombin Time: 12.2 seconds (ref 11.4–15.2)

## 2022-04-21 LAB — TROPONIN I (HIGH SENSITIVITY)
Troponin I (High Sensitivity): 18 ng/L — ABNORMAL HIGH (ref <18)
Troponin I (High Sensitivity): 22 ng/L — ABNORMAL HIGH (ref <18)

## 2022-04-21 LAB — RESP PANEL BY RT-PCR (FLU A&B, COVID) ARPGX2
Influenza A by PCR: NEGATIVE
Influenza B by PCR: NEGATIVE
SARS Coronavirus 2 by RT PCR: NEGATIVE

## 2022-04-21 LAB — BRAIN NATRIURETIC PEPTIDE: B Natriuretic Peptide: 402.8 pg/mL — ABNORMAL HIGH (ref 0.0–100.0)

## 2022-04-21 LAB — MAGNESIUM: Magnesium: 1.7 mg/dL (ref 1.7–2.4)

## 2022-04-21 LAB — LIPASE, BLOOD: Lipase: 13 U/L (ref 11–51)

## 2022-04-21 LAB — OCCULT BLOOD X 1 CARD TO LAB, STOOL: Fecal Occult Bld: NEGATIVE

## 2022-04-21 LAB — LACTIC ACID, PLASMA: Lactic Acid, Venous: 0.7 mmol/L (ref 0.5–1.9)

## 2022-04-21 LAB — CBG MONITORING, ED: Glucose-Capillary: 118 mg/dL — ABNORMAL HIGH (ref 70–99)

## 2022-04-21 MED ORDER — IOHEXOL 350 MG/ML SOLN
100.0000 mL | Freq: Once | INTRAVENOUS | Status: AC | PRN
  Administered 2022-04-21: 75 mL via INTRAVENOUS

## 2022-04-21 MED ORDER — DROPERIDOL 2.5 MG/ML IJ SOLN: 1.2500 mg | Freq: Once | INTRAMUSCULAR | Status: DC

## 2022-04-21 MED ORDER — SODIUM CHLORIDE 0.9 % IV BOLUS
500.0000 mL | Freq: Once | INTRAVENOUS | Status: AC
Start: 2022-04-21 — End: 2022-04-21
  Administered 2022-04-21: 500 mL via INTRAVENOUS

## 2022-04-21 MED ORDER — PANTOPRAZOLE INFUSION (NEW) - SIMPLE MED
8.0000 mg/h | INTRAVENOUS | Status: DC
  Administered 2022-04-21: 8 mg/h via INTRAVENOUS
  Filled 2022-04-21: qty 100

## 2022-04-21 MED ORDER — SODIUM CHLORIDE 0.9 % IV SOLN: INTRAVENOUS | Status: DC

## 2022-04-21 MED ORDER — LORAZEPAM 1 MG PO TABS
0.5000 mg | ORAL_TABLET | Freq: Once | ORAL | Status: DC
  Filled 2022-04-21: qty 1

## 2022-04-21 MED ORDER — PANTOPRAZOLE 80MG IVPB - SIMPLE MED
80.0000 mg | Freq: Once | INTRAVENOUS | Status: AC
  Administered 2022-04-21: 80 mg via INTRAVENOUS
  Filled 2022-04-21: qty 100

## 2022-04-21 MED ORDER — DROPERIDOL 2.5 MG/ML IJ SOLN
2.5000 mg | Freq: Once | INTRAMUSCULAR | Status: AC
  Administered 2022-04-21: 2.5 mg via INTRAVENOUS

## 2022-04-21 MED ORDER — PROMETHAZINE HCL 12.5 MG PO TABS: 12.5000 mg | ORAL_TABLET | Freq: Four times a day (QID) | ORAL | 0 refills | Status: DC | PRN

## 2022-04-21 MED ORDER — PROMETHEGAN 25 MG RE SUPP: 25.0000 mg | RECTAL | 0 refills | Status: DC

## 2022-04-21 MED ORDER — ONDANSETRON HCL 4 MG/2ML IJ SOLN
4.0000 mg | Freq: Once | INTRAMUSCULAR | Status: DC
  Filled 2022-04-21: qty 2

## 2022-04-21 MED ORDER — PANTOPRAZOLE SODIUM 40 MG IV SOLR
INTRAVENOUS | Status: AC
  Filled 2022-04-21: qty 150

## 2022-04-21 MED ORDER — PROMETHAZINE HCL 25 MG/ML IJ SOLN
INTRAMUSCULAR | Status: AC
  Filled 2022-04-21: qty 1

## 2022-04-21 MED ORDER — FENTANYL CITRATE PF 50 MCG/ML IJ SOSY
50.0000 ug | PREFILLED_SYRINGE | Freq: Once | INTRAMUSCULAR | Status: AC
  Administered 2022-04-21: 50 ug via INTRAVENOUS
  Filled 2022-04-21: qty 1

## 2022-04-21 NOTE — ED Notes (Signed)
Unable to do orthostatics due to patient condition

## 2022-04-21 NOTE — ED Notes (Signed)
Patient transported to CT 

## 2022-04-21 NOTE — Discharge Instructions (Addendum)
Please follow-up with your PCP as needed for a recheck of your renal function, ensure you are staying hydrated over the next few days as you did receive IV contrast in the setting of your CKD to rule out an emergent condition.  Your CT imaging was reassuring.  Your symptoms are consistent with likely gastroparesis flare.  Given your recent EGD showing evidence of esophagitis, gastroenterology did not feel you needed further inpatient work-up at this time.  Recommend you continue your outpatient management.  Continue to take Phenergan for nausea and vomiting.  Return for significant large-volume hematemesis, inability to tolerate oral intake.

## 2022-04-21 NOTE — ED Triage Notes (Signed)
Pt presents with c/o n/v for 4 days. Pt with history of recent cardiac arrest and MI (April 05, 2022).  Pt unable to keep meds down this morning Pt has insulin pump

## 2022-04-21 NOTE — ED Notes (Signed)
Pt turned iv pump off, cbg 112.

## 2022-04-21 NOTE — ED Provider Notes (Incomplete)
[facility] EMERGENCY DEPT Provider Note   CSN: [id number] Arrival date & time: 04/21/22  0849     History {Add pertinent medical, surgical, social history, OB history to HPI:1} Chief Complaint  Patient presents with   Emesis    [name] is a 51 y.o. female.   Emesis      Home Medications Prior to Admission medications   Medication Sig Start Date End Date Taking? Authorizing Provider  acetaminophen (TYLENOL) 500 MG tablet Take 500 mg by mouth every 6 (six) hours as needed for moderate pain.    [provider]  albuterol (PROAIR HFA) 108 (90 Base) MCG/ACT inhaler Inhale 2 puffs into the lungs every 6 (six) hours as needed for wheezing or shortness of breath. 07/03/20   Waldon Merl, PA-C  Carboxymethylcellul-Glycerin (LUBRICATING EYE DROPS OP) Place 1 drop into both eyes daily as needed (dry eyes).    [provider]  Continuous Blood Gluc Sensor (FREESTYLE LIBRE 2 SENSOR) MISC CHANGE EVERY 14 DAYS 03/20/20   [provider]  hydrOXYzine (ATARAX) 50 MG tablet Take 50 mg by mouth in the morning, at noon, and at bedtime. 10/01/21   [provider]  Insulin Disposable Pump (OMNIPOD 10 PACK) MISC by Does not apply route.    [provider]  isosorbide-hydrALAZINE (BIDIL) 20-37.5 MG tablet Take 1 tablet by mouth 3 (three) times daily. 04/11/22 05/11/22  Hughie Closs, MD  metoCLOPramide (REGLAN) 10 MG tablet Take 1 tablet (10 mg total) by mouth 3 (three) times daily before meals for 15 days. 04/11/22 04/26/22  Hughie Closs, MD  metoprolol succinate (TOPROL XL) 25 MG 24 hr tablet Take 1 tablet (25 mg total) by mouth daily. 04/19/22   Graciella Freer, PA-C  NOVOLOG 100 UNIT/ML injection Inject 0-120 Units into the skin daily. 04/06/20   [provider]  pantoprazole (PROTONIX) 40 MG tablet Take 1 tablet (40 mg total) by mouth daily. 04/03/22 06/02/22  Calvert Cantor, MD  polyethylene glycol (MIRALAX / GLYCOLAX) 17  g packet Take 17 g by mouth daily as needed for moderate constipation.    [provider]  pregabalin (LYRICA) 50 MG capsule Take 50 mg by mouth 2 (two) times daily as needed (neuropathy). 12/05/21   [provider]  promethazine (PHENERGAN) 12.5 MG tablet Take 1 tablet (12.5 mg total) by mouth every 6 (six) hours as needed for nausea or vomiting. 04/03/22   Calvert Cantor, MD  PROMETHEGAN 25 MG suppository Place 1 suppository rectally as directed. 04/17/22   [provider]  rosuvastatin (CRESTOR) 20 MG tablet Take 1 tablet (20 mg total) by mouth daily. 04/12/22 05/12/22  Hughie Closs, MD  tobramycin (TOBREX) 0.3 % ophthalmic solution Place 1-2 drops into both eyes See admin instructions. Instill 1-2 drops into both eyes 3 times daily the day before, day of, and the day after monthly eye injections 11/17/20   [provider]  torsemide (DEMADEX) 20 MG tablet Take 20 mg by mouth daily as needed (swelling). 12/25/21   [provider]  TRESIBA FLEXTOUCH 200 UNIT/ML FlexTouch Pen Inject 50 Units into the skin daily as needed (when insulin pump stops working). 11/04/21   [provider]      Allergies    Amoxicillin, Atorvastatin, Dulaglutide, Lantus [insulin glargine], and Miconazole nitrate    Review of Systems   Review of Systems  Gastrointestinal:  Positive for vomiting.    Physical Exam Updated Vital Signs BP (!) 184/113 (BP Location: Left Arm)  Pulse (!) 102   Temp 98.4 F (36.9 C) (Oral)   Resp (!) 22   Ht 6' (1.829 m)   Wt 78.9 kg   LMP 11/17/2021 (Approximate)   SpO2 97%   BMI 23.60 kg/m  Physical Exam  ED Results / Procedures / Treatments   Labs (all labs ordered are listed, but only abnormal results are displayed) Labs Reviewed  CBG MONITORING, ED - Abnormal; Notable for the following components:      Result Value   Glucose-Capillary 118 (*)    All other components within normal limits  LIPASE, BLOOD  COMPREHENSIVE METABOLIC  PANEL  CBC  URINALYSIS, ROUTINE W REFLEX MICROSCOPIC  PREGNANCY, URINE    EKG None  Radiology CUP [facility] DEVICE CHECK  Result Date: 04/19/2022 Wound check appointment. Steri-strips removed. Wound without redness or edema. Incision edges approximated, wound well healed. Normal device function. Thresholds, sensing, and impedances consistent with implant measurements. Device programmed at 3.5V for  extra safety margin until 3 month visit. Histogram distribution appropriate for patient and level of activity. No mode switches or ventricular arrhythmias noted. Patient educated about wound care, arm mobility, lifting restrictions, shock plan. ROV in 3  months with Dr. Myles Gip See scanned sheets   Procedures Procedures  {Document cardiac monitor, telemetry assessment procedure when appropriate:1}  Medications Ordered in ED Medications - No data to display  ED Course/ Medical Decision Making/ A&P                           Medical Decision Making Amount and/or Complexity of Data Reviewed Labs: ordered. Radiology: ordered.  Risk Prescription drug management.   ***  {Document critical care time when appropriate:1} {Document review of labs and clinical decision tools ie heart score, Chads2Vasc2 etc:1}  {Document your independent review of radiology images, and any outside records:1} {Document your discussion with family members, caretakers, and with consultants:1} {Document social determinants of health affecting pt's care:1} {Document your decision making why or why not admission, treatments were needed:1} Final Clinical Impression(s) / ED Diagnoses Final diagnoses:  None    Rx / DC Orders ED Discharge Orders     None

## 2022-04-23 ENCOUNTER — Other Ambulatory Visit: Payer: Self-pay

## 2022-04-23 ENCOUNTER — Inpatient Hospital Stay (HOSPITAL_BASED_OUTPATIENT_CLINIC_OR_DEPARTMENT_OTHER)
Admission: EM | Admit: 2022-04-23 | Discharge: 2022-04-29 | DRG: 074 | Disposition: A | Discharge status: Home / Self Care | Payer: BC Managed Care – PPO | Attending: Internal Medicine | Admitting: Internal Medicine

## 2022-04-23 ENCOUNTER — Encounter (HOSPITAL_BASED_OUTPATIENT_CLINIC_OR_DEPARTMENT_OTHER): Payer: Self-pay

## 2022-04-23 ENCOUNTER — Observation Stay (HOSPITAL_COMMUNITY): Payer: BC Managed Care – PPO

## 2022-04-23 ENCOUNTER — Encounter (HOSPITAL_COMMUNITY): Payer: Self-pay

## 2022-04-23 DIAGNOSIS — Z20822 Contact with and (suspected) exposure to covid-19: Secondary | ICD-10-CM | POA: Diagnosis not present

## 2022-04-23 DIAGNOSIS — K920 Hematemesis: Secondary | ICD-10-CM

## 2022-04-23 DIAGNOSIS — K3184 Gastroparesis: Secondary | ICD-10-CM | POA: Diagnosis present

## 2022-04-23 DIAGNOSIS — E1040 Type 1 diabetes mellitus with diabetic neuropathy, unspecified: Secondary | ICD-10-CM

## 2022-04-23 DIAGNOSIS — E860 Dehydration: Secondary | ICD-10-CM | POA: Diagnosis not present

## 2022-04-23 DIAGNOSIS — I129 Hypertensive chronic kidney disease with stage 1 through stage 4 chronic kidney disease, or unspecified chronic kidney disease: Secondary | ICD-10-CM

## 2022-04-23 DIAGNOSIS — Z888 Allergy status to other drugs, medicaments and biological substances status: Secondary | ICD-10-CM | POA: Diagnosis not present

## 2022-04-23 DIAGNOSIS — K209 Esophagitis, unspecified without bleeding: Secondary | ICD-10-CM

## 2022-04-23 DIAGNOSIS — I5032 Chronic diastolic (congestive) heart failure: Secondary | ICD-10-CM | POA: Diagnosis not present

## 2022-04-23 DIAGNOSIS — E1143 Type 2 diabetes mellitus with diabetic autonomic (poly)neuropathy: Secondary | ICD-10-CM | POA: Diagnosis present

## 2022-04-23 DIAGNOSIS — Z8719 Personal history of other diseases of the digestive system: Secondary | ICD-10-CM

## 2022-04-23 DIAGNOSIS — D649 Anemia, unspecified: Secondary | ICD-10-CM

## 2022-04-23 DIAGNOSIS — Z95810 Presence of automatic (implantable) cardiac defibrillator: Secondary | ICD-10-CM | POA: Diagnosis not present

## 2022-04-23 DIAGNOSIS — E1043 Type 1 diabetes mellitus with diabetic autonomic (poly)neuropathy: Secondary | ICD-10-CM | POA: Diagnosis not present

## 2022-04-23 DIAGNOSIS — I130 Hypertensive heart and chronic kidney disease with heart failure and stage 1 through stage 4 chronic kidney disease, or unspecified chronic kidney disease: Secondary | ICD-10-CM | POA: Diagnosis present

## 2022-04-23 DIAGNOSIS — D638 Anemia in other chronic diseases classified elsewhere: Secondary | ICD-10-CM

## 2022-04-23 DIAGNOSIS — R112 Nausea with vomiting, unspecified: Secondary | ICD-10-CM | POA: Diagnosis not present

## 2022-04-23 DIAGNOSIS — E870 Hyperosmolality and hypernatremia: Secondary | ICD-10-CM | POA: Diagnosis not present

## 2022-04-23 DIAGNOSIS — J45909 Unspecified asthma, uncomplicated: Secondary | ICD-10-CM | POA: Diagnosis present

## 2022-04-23 DIAGNOSIS — N184 Chronic kidney disease, stage 4 (severe): Secondary | ICD-10-CM | POA: Diagnosis present

## 2022-04-23 DIAGNOSIS — Z794 Long term (current) use of insulin: Secondary | ICD-10-CM | POA: Diagnosis not present

## 2022-04-23 DIAGNOSIS — E1022 Type 1 diabetes mellitus with diabetic chronic kidney disease: Secondary | ICD-10-CM | POA: Diagnosis present

## 2022-04-23 DIAGNOSIS — Z8674 Personal history of sudden cardiac arrest: Secondary | ICD-10-CM | POA: Diagnosis not present

## 2022-04-23 DIAGNOSIS — N1832 Chronic kidney disease, stage 3b: Secondary | ICD-10-CM | POA: Diagnosis not present

## 2022-04-23 DIAGNOSIS — Z833 Family history of diabetes mellitus: Secondary | ICD-10-CM

## 2022-04-23 DIAGNOSIS — Z9641 Presence of insulin pump (external) (internal): Secondary | ICD-10-CM | POA: Diagnosis present

## 2022-04-23 DIAGNOSIS — Z79899 Other long term (current) drug therapy: Secondary | ICD-10-CM

## 2022-04-23 DIAGNOSIS — R1110 Vomiting, unspecified: Secondary | ICD-10-CM

## 2022-04-23 DIAGNOSIS — I10 Essential (primary) hypertension: Secondary | ICD-10-CM | POA: Diagnosis present

## 2022-04-23 DIAGNOSIS — R1013 Epigastric pain: Secondary | ICD-10-CM

## 2022-04-23 DIAGNOSIS — Z880 Allergy status to penicillin: Secondary | ICD-10-CM | POA: Diagnosis not present

## 2022-04-23 DIAGNOSIS — I5042 Chronic combined systolic (congestive) and diastolic (congestive) heart failure: Secondary | ICD-10-CM | POA: Diagnosis not present

## 2022-04-23 DIAGNOSIS — D631 Anemia in chronic kidney disease: Secondary | ICD-10-CM | POA: Diagnosis not present

## 2022-04-23 DIAGNOSIS — D573 Sickle-cell trait: Secondary | ICD-10-CM | POA: Diagnosis present

## 2022-04-23 DIAGNOSIS — Z8249 Family history of ischemic heart disease and other diseases of the circulatory system: Secondary | ICD-10-CM

## 2022-04-23 DIAGNOSIS — N179 Acute kidney failure, unspecified: Secondary | ICD-10-CM | POA: Diagnosis present

## 2022-04-23 DIAGNOSIS — E1069 Type 1 diabetes mellitus with other specified complication: Secondary | ICD-10-CM | POA: Diagnosis present

## 2022-04-23 LAB — COMPREHENSIVE METABOLIC PANEL
ALT: 8 U/L (ref 0–44)
AST: 10 U/L — ABNORMAL LOW (ref 15–41)
Albumin: 3.8 g/dL (ref 3.5–5.0)
Alkaline Phosphatase: 80 U/L (ref 38–126)
Anion gap: 12 (ref 5–15)
BUN: 23 mg/dL — ABNORMAL HIGH (ref 6–20)
CO2: 24 mmol/L (ref 22–32)
Calcium: 9.4 mg/dL (ref 8.9–10.3)
Chloride: 107 mmol/L (ref 98–111)
Creatinine, Ser: 2.78 mg/dL — ABNORMAL HIGH (ref 0.44–1.00)
GFR, Estimated: 20 mL/min — ABNORMAL LOW (ref >60)
Glucose, Bld: 310 mg/dL — ABNORMAL HIGH (ref 70–99)
Potassium: 3.8 mmol/L (ref 3.5–5.1)
Sodium: 143 mmol/L (ref 135–145)
Total Bilirubin: 0.9 mg/dL (ref 0.3–1.2)
Total Protein: 7.2 g/dL (ref 6.5–8.1)

## 2022-04-23 LAB — LIPASE, BLOOD: Lipase: <10 U/L — ABNORMAL LOW (ref 11–51)

## 2022-04-23 LAB — I-STAT VENOUS BLOOD GAS, ED
Acid-Base Excess: 0 mmol/L (ref 0.0–2.0)
Bicarbonate: 24.9 mmol/L (ref 20.0–28.0)
Calcium, Ion: 1.24 mmol/L (ref 1.15–1.40)
HCT: 29 % — ABNORMAL LOW (ref 36.0–46.0)
Hemoglobin: 9.9 g/dL — ABNORMAL LOW (ref 12.0–15.0)
O2 Saturation: 58 %
Potassium: 3.8 mmol/L (ref 3.5–5.1)
Sodium: 143 mmol/L (ref 135–145)
TCO2: 26 mmol/L (ref 22–32)
pCO2, Ven: 42.3 mmHg — ABNORMAL LOW (ref 44–60)
pH, Ven: 7.378 (ref 7.25–7.43)
pO2, Ven: 31 mmHg — CL (ref 32–45)

## 2022-04-23 LAB — RAPID URINE DRUG SCREEN, HOSP PERFORMED
Amphetamines: NOT DETECTED
Barbiturates: NOT DETECTED
Benzodiazepines: NOT DETECTED
Cocaine: NOT DETECTED
Opiates: NOT DETECTED
Tetrahydrocannabinol: NOT DETECTED

## 2022-04-23 LAB — URINALYSIS, ROUTINE W REFLEX MICROSCOPIC
Bilirubin Urine: NEGATIVE
Glucose, UA: >1000 mg/dL — AB
Leukocytes,Ua: NEGATIVE
Nitrite: NEGATIVE
Protein, ur: >300 mg/dL — AB
Specific Gravity, Urine: 1.016 (ref 1.005–1.030)
pH: 7 (ref 5.0–8.0)

## 2022-04-23 LAB — CBC WITH DIFFERENTIAL/PLATELET
Abs Immature Granulocytes: 0.01 10*3/uL (ref 0.00–0.07)
Basophils Absolute: 0 10*3/uL (ref 0.0–0.1)
Basophils Relative: 0 %
Eosinophils Absolute: 0 10*3/uL (ref 0.0–0.5)
Eosinophils Relative: 0 %
HCT: 31.4 % — ABNORMAL LOW (ref 36.0–46.0)
Hemoglobin: 10.2 g/dL — ABNORMAL LOW (ref 12.0–15.0)
Immature Granulocytes: 0 %
Lymphocytes Relative: 11 %
Lymphs Abs: 1 10*3/uL (ref 0.7–4.0)
MCH: 27.4 pg (ref 26.0–34.0)
MCHC: 32.5 g/dL (ref 30.0–36.0)
MCV: 84.4 fL (ref 80.0–100.0)
Monocytes Absolute: 0.2 10*3/uL (ref 0.1–1.0)
Monocytes Relative: 2 %
Neutro Abs: 7.4 10*3/uL (ref 1.7–7.7)
Neutrophils Relative %: 87 %
Platelets: 270 10*3/uL (ref 150–400)
RBC: 3.72 MIL/uL — ABNORMAL LOW (ref 3.87–5.11)
RDW: 14 % (ref 11.5–15.5)
WBC: 8.6 10*3/uL (ref 4.0–10.5)
nRBC: 0 % (ref 0.0–0.2)

## 2022-04-23 LAB — GLUCOSE, CAPILLARY: Glucose-Capillary: 174 mg/dL — ABNORMAL HIGH (ref 70–99)

## 2022-04-23 LAB — OCCULT BLOOD X 1 CARD TO LAB, STOOL: Fecal Occult Bld: POSITIVE — AB

## 2022-04-23 LAB — CBG MONITORING, ED: Glucose-Capillary: 286 mg/dL — ABNORMAL HIGH (ref 70–99)

## 2022-04-23 MED ORDER — ACETAMINOPHEN 325 MG PO TABS
650.0000 mg | ORAL_TABLET | Freq: Four times a day (QID) | ORAL | Status: DC | PRN
  Administered 2022-04-25 – 2022-04-29 (×2): 650 mg via ORAL
  Filled 2022-04-23 (×4): qty 2

## 2022-04-23 MED ORDER — ACETAMINOPHEN 650 MG RE SUPP
650.0000 mg | Freq: Four times a day (QID) | RECTAL | Status: DC | PRN
  Administered 2022-04-26: 650 mg via RECTAL
  Filled 2022-04-23: qty 1

## 2022-04-23 MED ORDER — INSULIN DETEMIR 100 UNIT/ML ~~LOC~~ SOLN: 12.0000 [IU] | Freq: Every day | SUBCUTANEOUS | Status: DC

## 2022-04-23 MED ORDER — ONDANSETRON HCL 4 MG/2ML IJ SOLN
4.0000 mg | Freq: Once | INTRAMUSCULAR | Status: AC
  Administered 2022-04-23: 4 mg via INTRAVENOUS
  Filled 2022-04-23: qty 2

## 2022-04-23 MED ORDER — PROMETHAZINE HCL 25 MG/ML IJ SOLN
12.5000 mg | Freq: Once | INTRAMUSCULAR | Status: AC
  Administered 2022-04-23: 12.5 mg via INTRAMUSCULAR
  Filled 2022-04-23: qty 1

## 2022-04-23 MED ORDER — INSULIN ASPART 100 UNIT/ML IJ SOLN
0.0000 [IU] | INTRAMUSCULAR | Status: DC
  Administered 2022-04-23: 2 [IU] via SUBCUTANEOUS
  Administered 2022-04-24 (×2): 1 [IU] via SUBCUTANEOUS
  Administered 2022-04-24: 2 [IU] via SUBCUTANEOUS
  Administered 2022-04-24: 3 [IU] via SUBCUTANEOUS
  Administered 2022-04-24 – 2022-04-25 (×3): 2 [IU] via SUBCUTANEOUS
  Administered 2022-04-25 – 2022-04-27 (×8): 1 [IU] via SUBCUTANEOUS
  Administered 2022-04-28 – 2022-04-29 (×5): 2 [IU] via SUBCUTANEOUS
  Administered 2022-04-29: 1 [IU] via SUBCUTANEOUS
  Administered 2022-04-29: 2 [IU] via SUBCUTANEOUS

## 2022-04-23 MED ORDER — LACTATED RINGERS IV SOLN: INTRAVENOUS | Status: DC

## 2022-04-23 MED ORDER — DROPERIDOL 2.5 MG/ML IJ SOLN
2.5000 mg | Freq: Once | INTRAMUSCULAR | Status: AC
  Administered 2022-04-23: 2.5 mg via INTRAVENOUS
  Filled 2022-04-23: qty 2

## 2022-04-23 MED ORDER — ONDANSETRON HCL 4 MG PO TABS: 4.0000 mg | ORAL_TABLET | Freq: Four times a day (QID) | ORAL | Status: DC | PRN

## 2022-04-23 MED ORDER — METOCLOPRAMIDE HCL 5 MG/ML IJ SOLN
10.0000 mg | Freq: Once | INTRAMUSCULAR | Status: DC
  Filled 2022-04-23: qty 2

## 2022-04-23 MED ORDER — PANTOPRAZOLE SODIUM 40 MG IV SOLR
40.0000 mg | Freq: Once | INTRAVENOUS | Status: AC
  Administered 2022-04-23: 40 mg via INTRAVENOUS
  Filled 2022-04-23: qty 10

## 2022-04-23 MED ORDER — INSULIN GLARGINE-YFGN 100 UNIT/ML ~~LOC~~ SOLN
12.0000 [IU] | Freq: Every day | SUBCUTANEOUS | Status: DC
  Administered 2022-04-23 – 2022-04-29 (×7): 12 [IU] via SUBCUTANEOUS
  Filled 2022-04-23 (×3): qty 0.12
  Filled 2022-04-23: qty 120
  Filled 2022-04-23 (×3): qty 0.12

## 2022-04-23 MED ORDER — LABETALOL HCL 5 MG/ML IV SOLN
5.0000 mg | INTRAVENOUS | Status: DC | PRN
  Administered 2022-04-23: 5 mg via INTRAVENOUS
  Administered 2022-04-24: 10 mg via INTRAVENOUS
  Administered 2022-04-24: 5 mg via INTRAVENOUS
  Administered 2022-04-25: 10 mg via INTRAVENOUS
  Administered 2022-04-25: 5 mg via INTRAVENOUS
  Filled 2022-04-23 (×6): qty 4

## 2022-04-23 MED ORDER — ACETAMINOPHEN 10 MG/ML IV SOLN
1000.0000 mg | Freq: Four times a day (QID) | INTRAVENOUS | Status: AC | PRN
  Filled 2022-04-23: qty 100

## 2022-04-23 MED ORDER — FENTANYL CITRATE PF 50 MCG/ML IJ SOSY
25.0000 ug | PREFILLED_SYRINGE | Freq: Once | INTRAMUSCULAR | Status: AC
  Administered 2022-04-23: 25 ug via INTRAVENOUS
  Filled 2022-04-23: qty 1

## 2022-04-23 MED ORDER — PANTOPRAZOLE SODIUM 40 MG IV SOLR
40.0000 mg | Freq: Two times a day (BID) | INTRAVENOUS | Status: DC
  Administered 2022-04-23 – 2022-04-24 (×2): 40 mg via INTRAVENOUS
  Filled 2022-04-23 (×2): qty 10

## 2022-04-23 MED ORDER — METOCLOPRAMIDE HCL 5 MG/ML IJ SOLN
10.0000 mg | Freq: Four times a day (QID) | INTRAMUSCULAR | Status: DC
  Administered 2022-04-23 – 2022-04-29 (×23): 10 mg via INTRAVENOUS
  Filled 2022-04-23 (×23): qty 2

## 2022-04-23 MED ORDER — LABETALOL HCL 5 MG/ML IV SOLN: 5.0000 mg | INTRAVENOUS | Status: DC | PRN

## 2022-04-23 MED ORDER — ONDANSETRON HCL 4 MG/2ML IJ SOLN
4.0000 mg | Freq: Four times a day (QID) | INTRAMUSCULAR | Status: DC | PRN
  Administered 2022-04-24: 4 mg via INTRAVENOUS
  Filled 2022-04-23: qty 2

## 2022-04-23 MED ORDER — SODIUM CHLORIDE 0.9 % IV BOLUS
1000.0000 mL | Freq: Once | INTRAVENOUS | Status: AC
  Administered 2022-04-23: 1000 mL via INTRAVENOUS

## 2022-04-23 MED ORDER — HEPARIN SODIUM (PORCINE) 5000 UNIT/ML IJ SOLN: 5000.0000 [IU] | Freq: Three times a day (TID) | INTRAMUSCULAR | Status: DC

## 2022-04-23 NOTE — Inpatient Diabetes Management (Signed)
Inpatient Diabetes Program Recommendations  AACE/ADA: New Consensus Statement on Inpatient Glycemic Control (2015)  Target Ranges:  Prepandial:   less than 140 mg/dL      Peak postprandial:   less than 180 mg/dL (1-2 hours)      Critically ill patients:  140 - 180 mg/dL   Lab Results  Component Value Date   GLUCAP 286 (H) 04/23/2022   HGBA1C 6.3 (H) 03/25/2022    Review of Glycemic Control  Latest Reference Range & Units 04/11/22 06:23 04/11/22 11:35 04/21/22 09:40 04/23/22 12:31  Glucose-Capillary 70 - 99 mg/dL 230 (H) 226 (H) 118 (H) 286 (H)  (H): Data is abnormally high Diabetes history: Type 1 DM  (requires basal/bolus) Outpatient Diabetes medications:  Omnipod (uses approx. 50 units daily),  Tresiba 50 units daily (when off pump) Current orders for Inpatient glycemic control:  none  Inpatient Diabetes Program Recommendations:    Spoke with patient regarding insulin pump and daily diabetes management. Patient is not currently wearing insulin pump due to continued nausea and vomiting.  Does not have supplies in hospital. Does not know insulin settings. Based on previous hospitalization patient not requiring near as much basal as listed outpatient; patient in agreement with recommendations. Is followed by Dr Buddy Duty, outpatient endocrinology.   At this time consider: -Levemir 12 units Q24H -Novolog 0-9 units Q4H  Secure chat sent to MD.   Thanks, Bronson Curb, MSN, RNC-OB Diabetes Coordinator [phone] (8a-5p)

## 2022-04-23 NOTE — Assessment & Plan Note (Signed)
Profound worsening in kidney function over course of this past year it looks like, not actually clear how bad her baseline is given recent hospital stays.

## 2022-04-23 NOTE — Assessment & Plan Note (Signed)
Hold home PO med Labetalol PRN

## 2022-04-23 NOTE — Assessment & Plan Note (Addendum)
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 IVF 2. NPO 3. Reglan $RemoveB'10mg'[id number]$  Q6H scheduled 4. Minimize narcotics due to worsening delay in gastric emptying. 5. Tylenol PRN pain

## 2022-04-23 NOTE — Assessment & Plan Note (Signed)
1. Lantus 12u Q24H, got first dose in ED 2. SSI sensitive Q4H 3. Check CBG now 4. No anion gap on initial BMP

## 2022-04-23 NOTE — H&P (Signed)
History and Physical    Patient: [name] RXV:[id number] DOB: 08/01/1971 DOA: 04/23/2022 DOS: the patient was seen and examined on 04/23/2022 PCP: [name], PA  Patient coming from: Home  Chief Complaint:  Chief Complaint  Patient presents with   Emesis   HPI: [name] is a 51 y.o. female with medical history significant of DM1, HTN, diabetic neuropathy, gastroparesis.  Pt presents to ED with c/o N/V for past 5 days.  Symptoms constant, persistent.  Nothing makes better.  Patient was recently seen at the emergency department 2 days ago for the same symptoms.  Patient states she was discharged from the hospital and continue to experience symptoms.  Unable to keep PO phenergan down.  She states that her current symptoms feel similar to other exacerbations of said gastroparesis.  Patient denies hematemesis but states that she has had persistently coffee-ground emesis.  Denies fever, chills, night sweats, chest pain, shortness of breath, urinary symptoms, change in bowel habits.  She states that she was complaining of black tarry stools at a recent hospital visit but states that she is gone without bowel movement due to excessive vomiting.  No insulin in past 24h prior to ED visit.   Review of Systems: As mentioned in the history of present illness. All other systems reviewed and are negative. Past Medical History:  Diagnosis Date   Anemia    Anxiety    Arthritis    Asthma    CHF (congestive heart failure) ([place])    Depression    Diabetic ulcer of left foot ([place]) 05/12/2013   Generalized abdominal pain    History of chicken pox    Loss of weight 12/02/2019   Migraines    Mood disorder ([place])    anxiety   Prurigo nodularis    with diabetic dermopathy   Type 1 diabetes, uncontrolled, with neuropathy    [place]   Ulcers of both lower legs ([place]) 02/20/2014   Past Surgical History:  Procedure Laterality Date   BIOPSY  08/11/2020   Procedure: BIOPSY;  Surgeon:  Ladene Artist, MD;  Location: [facility] ENDOSCOPY;  Service: Endoscopy;;   BIOPSY  04/02/2022   Procedure: BIOPSY;  Surgeon: Jackquline Denmark, MD;  Location: [facility] ENDOSCOPY;  Service: Gastroenterology;;   ESOPHAGOGASTRODUODENOSCOPY (EGD) WITH PROPOFOL N/A 08/11/2020   Procedure: ESOPHAGOGASTRODUODENOSCOPY (EGD) WITH PROPOFOL;  Surgeon: Ladene Artist, MD;  Location: [facility] ENDOSCOPY;  Service: Endoscopy;  Laterality: N/A;   ESOPHAGOGASTRODUODENOSCOPY (EGD) WITH PROPOFOL N/A 04/02/2022   Procedure: ESOPHAGOGASTRODUODENOSCOPY (EGD) WITH PROPOFOL;  Surgeon: Jackquline Denmark, MD;  Location: [facility];  Service: Gastroenterology;  Laterality: N/A;   ICD IMPLANT N/A 04/09/2022   Procedure: ICD IMPLANT;  Surgeon: Constance Haw, MD;  Location: [facility];  Service: Cardiovascular;  Laterality: N/A;   treadmill stress test  01/2013   WNL, low risk study   Social History:  reports that she has never smoked. She has never used smokeless tobacco. She reports that she does not currently use alcohol. She reports that she does not use drugs.  Allergies  Allergen Reactions   Amoxicillin Itching   Atorvastatin Other (See Comments)    Dizziness    Dulaglutide Nausea And Vomiting   Lantus [Insulin Glargine] Itching and Rash   Miconazole Nitrate Rash    Family History  Problem Relation Age of Onset   Diabetes Mother        type 2   Hypertension Mother    Thyroid disease Mother    Bipolar  disorder Mother    Heart disease Mother    Calcium disorder Mother    Cancer Maternal Grandmother        Breast, stomach   Cancer Paternal Grandmother        stomach, lung (smoker)   Diabetes Paternal Grandmother    Diabetes Paternal Grandfather    Heart failure Sister    Diabetes Sister    Stroke Maternal Aunt    Cancer Maternal Uncle        prostate   CAD Maternal Aunt        stents   Cancer Maternal Aunt 64       ovarian    Prior to Admission medications   Medication Sig Start Date End Date Taking?  Authorizing Provider  acetaminophen (TYLENOL) 500 MG tablet Take 500 mg by mouth every 6 (six) hours as needed for moderate pain.    [provider]  albuterol (PROAIR HFA) 108 (90 Base) MCG/ACT inhaler Inhale 2 puffs into the lungs every 6 (six) hours as needed for wheezing or shortness of breath. 07/03/20   [name], PA-C  Carboxymethylcellul-Glycerin (LUBRICATING EYE DROPS OP) Place 1 drop into both eyes daily as needed (dry eyes).    [provider]  Continuous Blood Gluc Sensor (FREESTYLE LIBRE 2 SENSOR) MISC Inject 1 Device into the skin every 14 (fourteen) days. 03/20/20   [provider]  hydrOXYzine (ATARAX) 50 MG tablet Take 50 mg by mouth in the morning, at noon, and at bedtime. 10/01/21   [provider]  Insulin Disposable Pump (OMNIPOD 10 PACK) MISC by Does not apply route.    [provider]  isosorbide-hydrALAZINE (BIDIL) 20-37.5 MG tablet Take 1 tablet by mouth 3 (three) times daily. 04/11/22 05/11/22  Darliss Cheney, MD  metoCLOPramide (REGLAN) 10 MG tablet Take 1 tablet (10 mg total) by mouth 3 (three) times daily before meals for 15 days. 04/11/22 04/26/22  Darliss Cheney, MD  metoprolol succinate (TOPROL XL) 25 MG 24 hr tablet Take 1 tablet (25 mg total) by mouth daily. 04/19/22   [name], PA-C  NOVOLOG 100 UNIT/ML injection Inject 0-120 Units into the skin daily. 04/06/20   [provider]  pantoprazole (PROTONIX) 40 MG tablet Take 1 tablet (40 mg total) by mouth daily. 04/03/22 06/02/22  Debbe Odea, MD  polyethylene glycol (MIRALAX / GLYCOLAX) 17 g packet Take 17 g by mouth daily as needed for moderate constipation.    [provider]  pregabalin (LYRICA) 50 MG capsule Take 50 mg by mouth 2 (two) times daily as needed (neuropathy). 12/05/21   [provider]  promethazine (PHENERGAN) 12.5 MG tablet Take 1 tablet (12.5 mg total) by mouth every 6 (six) hours as needed for nausea or vomiting. 04/21/22    [name], MD  PROMETHEGAN 25 MG suppository Place 1 suppository (25 mg total) rectally as directed. 04/21/22   [name], MD  rosuvastatin (CRESTOR) 20 MG tablet Take 1 tablet (20 mg total) by mouth daily. 04/12/22 05/12/22  Darliss Cheney, MD  tobramycin (TOBREX) 0.3 % ophthalmic solution Place 1-2 drops into both eyes See admin instructions. Instill 1-2 drops into both eyes 3 times daily the day before, day of, and the day after monthly eye injections 11/17/20   [provider]  torsemide (DEMADEX) 20 MG tablet Take 20 mg by mouth daily as needed (swelling). 12/25/21   [provider]  TRESIBA FLEXTOUCH 200 UNIT/ML FlexTouch Pen Inject 50 Units into the skin daily as  needed (when insulin pump stops working). 11/04/21   [provider]    Physical Exam: Vitals:   04/23/22 1611 04/23/22 1630 04/23/22 1700 04/23/22 1820  BP: (!) 145/79 (!) 153/90 (!) 169/94 (!) 175/105  Pulse: 88 83 81 (!) 102  Resp: (!) 22 (!) 26 (!) 25 17  Temp: 98.2 F (36.8 C)   98.1 F (36.7 C)  TempSrc: Oral   Oral  SpO2: 96% 95% 98% 98%  Weight:      Height:       Constitutional: NAD, calm, comfortable Eyes: PERRL, lids and conjunctivae normal ENMT: Mucous membranes are moist. Posterior pharynx clear of any exudate or lesions.Normal dentition.  Neck: normal, supple, no masses, no thyromegaly Respiratory: clear to auscultation bilaterally, no wheezing, no crackles. Normal respiratory effort. No accessory muscle use.  Cardiovascular: Regular rate and rhythm, no murmurs / rubs / gallops. No extremity edema. 2+ pedal pulses. No carotid bruits.  Abdomen: no tenderness, no masses palpated. No hepatosplenomegaly. Bowel sounds positive.  Musculoskeletal: no clubbing / cyanosis. No joint deformity upper and lower extremities. Good ROM, no contractures. Normal muscle tone.  Skin: no rashes, lesions, ulcers. No induration Neurologic: CN 2-12 grossly intact. Sensation intact, DTR normal.  Strength 5/5 in all 4.  Psychiatric: Normal judgment and insight. Alert and oriented x 3. Normal mood.   Data Reviewed:       Latest Ref Rng & Units 04/23/2022   12:51 PM 04/23/2022   12:42 PM 04/21/2022    9:41 AM  CBC  WBC 4.0 - 10.5 K/uL  8.6  6.0   Hemoglobin 12.0 - 15.0 g/dL 9.9  10.2  9.3   Hematocrit 36.0 - 46.0 % 29.0  31.4  28.3   Platelets 150 - 400 K/uL  270  246       Latest Ref Rng & Units 04/23/2022   12:51 PM 04/23/2022   12:42 PM 04/21/2022    9:41 AM  CMP  Glucose 70 - 99 mg/dL  310  132   BUN 6 - 20 mg/dL  23  32   Creatinine 0.44 - 1.00 mg/dL  2.78  2.22   Sodium 135 - 145 mmol/L 143  143  143   Potassium 3.5 - 5.1 mmol/L 3.8  3.8  4.5   Chloride 98 - 111 mmol/L  107  110   CO2 22 - 32 mmol/L  24  23   Calcium 8.9 - 10.3 mg/dL  9.4  8.7   Total Protein 6.5 - 8.1 g/dL  7.2  6.7   Total Bilirubin 0.3 - 1.2 mg/dL  0.9  0.5   Alkaline Phos 38 - 126 U/L  80  71   AST 15 - 41 U/L  10  11   ALT 0 - 44 U/L  8  11      Assessment and Plan: * Intractable nausea and vomiting 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 Tylenol PRN pain  Hematemesis Concern for hematemesis again with the coffee ground emesis report, HGB 10 today is actually better than DC earlier this month. Had hematemesis from esophagitis during admit earlier this month / end of last month. EGD at that time showed esophagitis without active bleed. SCDs for DVT ppx Repeat CBC in AM Protonix IV BID scheduled   Stage 3b chronic kidney disease (CKD) ([place]) Profound worsening in kidney function over course of this past year it  looks like, not actually clear how bad her baseline is given recent hospital stays.  AKI (acute kidney injury) ([place]) Creat 2.75 today, most likely ATN / Pre-renal due to N/V. 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 Resume BIDIL and metoprolol PO when able to take POs again.      Advance Care Planning:   Code Status: Full Code  Consults: None  Family Communication: No family in room  Severity of Illness: The appropriate patient status for this patient is OBSERVATION. Observation status is judged to be reasonable and necessary in order to provide the required intensity of service to ensure the patient's safety. The patient's presenting symptoms, physical exam findings, and initial radiographic and laboratory data in the context of their medical condition is felt to place them at decreased risk for further clinical deterioration. Furthermore, it is anticipated that the patient will be medically stable for discharge from the hospital within 2 midnights of admission.   Author: Etta Quill., DO 04/23/2022 8:21 PM  For on call review [email or web address].

## 2022-04-23 NOTE — ED Provider Notes (Signed)
[facility] EMERGENCY DEPT Provider Note   CSN: [id number] Arrival date & time: 04/23/22  1130     History  Chief Complaint  Patient presents with   Emesis    [name] is a 51 y.o. female.   Emesis   51 year old female presents emergency department with complaints of nausea and vomiting for approximately 5 days.  Patient was recently seen at the emergency department 2 days ago for the same symptoms.  Patient states she was discharged from the hospital and continue to experience symptoms.  She did not take the oral Phenergan but states that she was unable to keep it down due to excessive vomiting.  She states that she did not try the rectal Phenergan prescribed.  Patient history of diabetes mellitus type 1 with associated gastroparesis.  She states that her current symptoms feel similar to other exacerbations of said gastroparesis.  Patient denies hematemesis but states that she has had persistently coffee-ground emesis.  Denies fever, chills, night sweats, chest pain, shortness of breath, urinary symptoms, change in bowel habits.  She states that she was complaining of black tarry stools at a recent hospital visit but states that she is gone without bowel movement due to excessive vomiting.  Patient also states she has not had her insulin in the past 24 hours.   Past medical history significant for diabetes mellitus type 1, CHF, asthma, migraine, cardiac arrest, CKD 3B  Home Medications Prior to Admission medications   Medication Sig Start Date End Date Taking? Authorizing Provider  acetaminophen (TYLENOL) 500 MG tablet Take 500 mg by mouth every 6 (six) hours as needed for moderate pain.    [provider]  albuterol (PROAIR HFA) 108 (90 Base) MCG/ACT inhaler Inhale 2 puffs into the lungs every 6 (six) hours as needed for wheezing or shortness of breath. 07/03/20   [name], PA-C  Carboxymethylcellul-Glycerin (LUBRICATING EYE DROPS OP) Place 1 drop  into both eyes daily as needed (dry eyes).    [provider]  Continuous Blood Gluc Sensor (FREESTYLE LIBRE 2 SENSOR) MISC CHANGE EVERY 14 DAYS 03/20/20   [provider]  hydrOXYzine (ATARAX) 50 MG tablet Take 50 mg by mouth in the morning, at noon, and at bedtime. 10/01/21   [provider]  Insulin Disposable Pump (OMNIPOD 10 PACK) MISC by Does not apply route.    [provider]  isosorbide-hydrALAZINE (BIDIL) 20-37.5 MG tablet Take 1 tablet by mouth 3 (three) times daily. 04/11/22 05/11/22  Darliss Cheney, MD  metoCLOPramide (REGLAN) 10 MG tablet Take 1 tablet (10 mg total) by mouth 3 (three) times daily before meals for 15 days. 04/11/22 04/26/22  Darliss Cheney, MD  metoprolol succinate (TOPROL XL) 25 MG 24 hr tablet Take 1 tablet (25 mg total) by mouth daily. 04/19/22   [name], PA-C  NOVOLOG 100 UNIT/ML injection Inject 0-120 Units into the skin daily. 04/06/20   [provider]  pantoprazole (PROTONIX) 40 MG tablet Take 1 tablet (40 mg total) by mouth daily. 04/03/22 06/02/22  Debbe Odea, MD  polyethylene glycol (MIRALAX / GLYCOLAX) 17 g packet Take 17 g by mouth daily as needed for moderate constipation.    [provider]  pregabalin (LYRICA) 50 MG capsule Take 50 mg by mouth 2 (two) times daily as needed (neuropathy). 12/05/21   [provider]  promethazine (PHENERGAN) 12.5 MG tablet Take 1 tablet (12.5 mg total) by mouth every 6 (six) hours as needed for nausea or vomiting. 04/21/22  [name], MD  PROMETHEGAN 25 MG suppository Place 1 suppository (25 mg total) rectally as directed. 04/21/22   [name], MD  rosuvastatin (CRESTOR) 20 MG tablet Take 1 tablet (20 mg total) by mouth daily. 04/12/22 05/12/22  Darliss Cheney, MD  tobramycin (TOBREX) 0.3 % ophthalmic solution Place 1-2 drops into both eyes See admin instructions. Instill 1-2 drops into both eyes 3 times daily the day before, day of, and the day after  monthly eye injections 11/17/20   [provider]  torsemide (DEMADEX) 20 MG tablet Take 20 mg by mouth daily as needed (swelling). 12/25/21   [provider]  TRESIBA FLEXTOUCH 200 UNIT/ML FlexTouch Pen Inject 50 Units into the skin daily as needed (when insulin pump stops working). 11/04/21   [provider]      Allergies    Amoxicillin, Atorvastatin, Dulaglutide, Lantus [insulin glargine], and Miconazole nitrate    Review of Systems   Review of Systems  Gastrointestinal:  Positive for vomiting.  All other systems reviewed and are negative.   Physical Exam Updated Vital Signs BP (!) 169/94   Pulse 81   Temp 98.2 F (36.8 C) (Oral)   Resp (!) 25   Ht 6' (1.829 m)   Wt 78.9 kg   LMP 11/17/2021 (Approximate)   SpO2 98%   BMI 23.59 kg/m  Physical Exam Vitals and nursing note reviewed.  Constitutional:      Appearance: She is well-developed. She is ill-appearing. She is not diaphoretic.     Comments: Patient actively vomiting with intermittent retching during HPI.  HENT:     Head: Normocephalic and atraumatic.  Eyes:     Conjunctiva/sclera: Conjunctivae normal.  Cardiovascular:     Rate and Rhythm: Normal rate and regular rhythm.     Heart sounds: No murmur heard. Pulmonary:     Effort: Pulmonary effort is normal. No respiratory distress.     Breath sounds: Normal breath sounds. No wheezing or rales.  Abdominal:     Palpations: Abdomen is soft.     Tenderness: There is abdominal tenderness in the epigastric area.     Comments: Epigastric tenderness on exam.  Musculoskeletal:        General: No swelling.     Cervical back: Neck supple.     Right lower leg: No edema.     Left lower leg: No edema.  Skin:    General: Skin is warm and dry.     Capillary Refill: Capillary refill takes less than 2 seconds.  Neurological:     Mental Status: She is alert.  Psychiatric:        Mood and Affect: Mood normal.     ED Results / Procedures /  Treatments   Labs (all labs ordered are listed, but only abnormal results are displayed) Labs Reviewed  CBC WITH DIFFERENTIAL/PLATELET - Abnormal; Notable for the following components:      Result Value   RBC 3.72 (*)    Hemoglobin 10.2 (*)    HCT 31.4 (*)    All other components within normal limits  COMPREHENSIVE METABOLIC PANEL - Abnormal; Notable for the following components:   Glucose, Bld 310 (*)    BUN 23 (*)    Creatinine, Ser 2.78 (*)    AST 10 (*)    GFR, Estimated 20 (*)    All other components within normal limits  LIPASE, BLOOD - Abnormal; Notable for the following components:   Lipase <10 (*)    All other  components within normal limits  URINALYSIS, ROUTINE W REFLEX MICROSCOPIC - Abnormal; Notable for the following components:   Glucose, UA >1,000 (*)    Hgb urine dipstick MODERATE (*)    Ketones, ur TRACE (*)    Protein, ur >300 (*)    All other components within normal limits  OCCULT BLOOD X 1 CARD TO LAB, STOOL - Abnormal; Notable for the following components:   Fecal Occult Bld POSITIVE (*)    All other components within normal limits  I-STAT VENOUS BLOOD GAS, ED - Abnormal; Notable for the following components:   pCO2, Ven 42.3 (*)    pO2, Ven 31 (*)    HCT 29.0 (*)    Hemoglobin 9.9 (*)    All other components within normal limits  CBG MONITORING, ED - Abnormal; Notable for the following components:   Glucose-Capillary 286 (*)    All other components within normal limits  BLOOD GAS, VENOUS  POC OCCULT BLOOD, ED    EKG EKG Interpretation  Date/Time:  Tuesday April 23 2022 12:29:03 EDT Ventricular Rate:  106 PR Interval:  147 QRS Duration: 85 QT Interval:  341 QTC Calculation: 453 R Axis:   87 Text Interpretation: Sinus tachycardia Nonspecific repol abnormality, diffuse leads Confirmed by Lennice Sites ([id number]) on 04/23/2022 12:34:36 PM  Radiology No results found.  Procedures Procedures    Medications Ordered in ED Medications   insulin glargine-yfgn (SEMGLEE) injection 12 Units (12 Units Subcutaneous Given 04/23/22 1549)  sodium chloride 0.9 % bolus 1,000 mL (0 mLs Intravenous Stopped 04/23/22 1550)  ondansetron (ZOFRAN) injection 4 mg (4 mg Intravenous Given 04/23/22 1303)  promethazine (PHENERGAN) injection 12.5 mg (12.5 mg Intramuscular Given 04/23/22 1343)  droperidol (INAPSINE) 2.5 MG/ML injection 2.5 mg (2.5 mg Intravenous Given 04/23/22 1501)  pantoprazole (PROTONIX) injection 40 mg (40 mg Intravenous Given 04/23/22 1501)  fentaNYL (SUBLIMAZE) injection 25 mcg (25 mcg Intravenous Given 04/23/22 1500)    ED Course/ Medical Decision Making/ A&P Clinical Course as of 04/23/22 1727  Tue Apr 23, 2022  1445 Fecal Occult Blood, POC(!): POSITIVE Fecal occult blood was performed on patient's emesis and confirmed presence of blood. [CR]  [place] Dr. Tarri Glenn of gastroenterology.  She agreed with admission of the patient through hospital medicine and they will consult [CR]  1615 Consulted hospital medicine Dr. Sloan Leiter regarding the patient.  He agreed with admission of the patient and assume further treatment/care. [CR]    Clinical Course User Index [CR] Wilnette Kales, PA                           Medical Decision Making Amount and/or Complexity of Data Reviewed Labs: ordered. Decision-making details documented in ED Course.  Risk Prescription drug management. Decision regarding hospitalization.   This patient presents to the ED for concern of abdominal pain, nausea/vomiting, this involves an extensive number of treatment options, and is a complaint that carries with it a high risk of complications and morbidity.  The differential diagnosis includes gastroparesis, gastritis, peptic ulcer disease, duodenal ulcer, pancreatitis, small bowel obstruction,    Co morbidities that complicate the patient evaluation  See HPI   Additional history obtained:  Additional history obtained from EMR External  records from outside source obtained and reviewed including EGD from late August which showed grade a esophagitis.  Patient had full cardiac and abdominal work-up on 04/21/2022 including CT chest abdomen pelvis for dissection which showed no findings indicative of patient's symptoms.  Lab Tests:  I Ordered, and personally interpreted labs.  The pertinent results include: No leukocytosis.  Mild anemia with a hemoglobin of 10.2 which is near patient's baseline.  No platelet abnormality.  No electrolyte abnormality.  Glucose of 310; patient has known diabetes mellitus type 1 and has not been taking her medicines at home.  No evidence of DKA on VBG with normal pH and no gap.  Renal function consistent with GFR of 20, creatinine of 2.78 and BUN of 23 shows near baseline of patient's renal function.  No transaminitis noted.  Occult blood positive for patient's vomit    Imaging Studies ordered:  Imaging studies considered but do not necessary due to imaging studies performed 2 days ago and no change in patient's symptoms.   Cardiac Monitoring: / EKG:  The patient was maintained on a cardiac monitor.  I personally viewed and interpreted the cardiac monitored which showed an underlying rhythm of: Sinus rhythm   Consultations Obtained:  See ED course  Problem List / ED Course / Critical interventions / Medication management  Intractable nausea and vomiting I ordered medication including 1 L of normal saline for rehydration, Zofran, Phenergan and droperidol for antiemetic.  Patient continued to experience nausea with episodes of vomiting.  Fentanyl for pain.  Protonix for presumed GI bleed. Reevaluation of the patient after these medicines showed that the patient improved slightly but patient still elicited nausea as well as vomiting. I have reviewed the patients home medicines and have made adjustments as needed   Social Determinants of Health:  Denies tobacco, illicit drug use.   Test /  Admission - Considered:  Intractable nausea and vomiting, coffee-ground emesis Vitals signs significant for persistently tachypneic with a respiratory rate greater than 22.  Patient persistently hypertensive as well throughout visit.. Otherwise within normal range and stable throughout visit. Laboratory/imaging studies significant for: See above Given intractable nature of patient's vomiting as well as failed outpatient therapy and revisit to the emergency department with similar symptoms, admission to the hospital deemed most necessary.  Most likely cause of patient's symptoms is her known gastroparesis given negative imaging studies 2 days ago while in the ED and similarity in patient reporting symptoms.  Proper consultations were made as depicted in ED course.   Treatment plan was discussed at length with patient and she acknowledged understanding was agreeable to said plan.  Patient was stable upon admission to the hospital.        Final Clinical Impression(s) / ED Diagnoses Final diagnoses:  Intractable nausea and vomiting  Coffee ground emesis    Rx / DC Orders ED Discharge Orders     None         Wilnette Kales, PA 04/23/22 1728    Lennice Sites, DO 04/24/22 1228

## 2022-04-23 NOTE — ED Triage Notes (Signed)
Patient here POV from Home.  Endorses Nausea and Vomiting for approximately 5 Days. Seen here Sunday for Same but endorses continued Symptoms.   No Diarrhea. Associated with ABD Pain. No Known Fevers. History of Gastroparesis and MI with Cardiac Arrest in September  Actively Vomiting in Triage. A&Ox4. GCS 15. BIB Wheelchair.

## 2022-04-23 NOTE — Assessment & Plan Note (Addendum)
Creat 2.75 today, most likely ATN / Pre-renal due to N/V. 1. IVF 2. Hold all nephrotoxic meds 3. Strict intake and output 4. Repeat BMP in AM 5. Getting renal US 6. Probably will want to get nephrology follow up at a minimum if this is indeed her new baseline

## 2022-04-23 NOTE — Assessment & Plan Note (Signed)
Concern for hematemesis again with the coffee ground emesis report, HGB 10 today is actually better than DC earlier this month. Had hematemesis from esophagitis during admit earlier this month / end of last month. EGD at that time showed esophagitis without active bleed. 1. SCDs for DVT ppx 2. Repeat CBC in AM 3. Protonix IV BID scheduled

## 2022-04-23 NOTE — Assessment & Plan Note (Addendum)
Hold diuretics in setting of AKI and likely dehydration from N/V. Resume BIDIL and metoprolol PO when able to take POs again.

## 2022-04-23 NOTE — Plan of Care (Signed)

## 2022-04-24 DIAGNOSIS — N1832 Chronic kidney disease, stage 3b: Secondary | ICD-10-CM | POA: Diagnosis not present

## 2022-04-24 DIAGNOSIS — D649 Anemia, unspecified: Secondary | ICD-10-CM | POA: Diagnosis not present

## 2022-04-24 DIAGNOSIS — R112 Nausea with vomiting, unspecified: Secondary | ICD-10-CM | POA: Diagnosis not present

## 2022-04-24 DIAGNOSIS — Z833 Family history of diabetes mellitus: Secondary | ICD-10-CM | POA: Diagnosis not present

## 2022-04-24 DIAGNOSIS — Z888 Allergy status to other drugs, medicaments and biological substances status: Secondary | ICD-10-CM | POA: Diagnosis not present

## 2022-04-24 DIAGNOSIS — I5042 Chronic combined systolic (congestive) and diastolic (congestive) heart failure: Secondary | ICD-10-CM | POA: Diagnosis present

## 2022-04-24 DIAGNOSIS — D631 Anemia in chronic kidney disease: Secondary | ICD-10-CM | POA: Diagnosis present

## 2022-04-24 DIAGNOSIS — D573 Sickle-cell trait: Secondary | ICD-10-CM | POA: Diagnosis present

## 2022-04-24 DIAGNOSIS — N189 Chronic kidney disease, unspecified: Secondary | ICD-10-CM | POA: Diagnosis not present

## 2022-04-24 DIAGNOSIS — K209 Esophagitis, unspecified without bleeding: Secondary | ICD-10-CM

## 2022-04-24 DIAGNOSIS — R1114 Bilious vomiting: Secondary | ICD-10-CM | POA: Diagnosis not present

## 2022-04-24 DIAGNOSIS — Z20822 Contact with and (suspected) exposure to covid-19: Secondary | ICD-10-CM | POA: Diagnosis present

## 2022-04-24 DIAGNOSIS — Z95810 Presence of automatic (implantable) cardiac defibrillator: Secondary | ICD-10-CM

## 2022-04-24 DIAGNOSIS — I5022 Chronic systolic (congestive) heart failure: Secondary | ICD-10-CM

## 2022-04-24 DIAGNOSIS — Z794 Long term (current) use of insulin: Secondary | ICD-10-CM | POA: Diagnosis not present

## 2022-04-24 DIAGNOSIS — I130 Hypertensive heart and chronic kidney disease with heart failure and stage 1 through stage 4 chronic kidney disease, or unspecified chronic kidney disease: Secondary | ICD-10-CM

## 2022-04-24 DIAGNOSIS — E1143 Type 2 diabetes mellitus with diabetic autonomic (poly)neuropathy: Secondary | ICD-10-CM

## 2022-04-24 DIAGNOSIS — R1110 Vomiting, unspecified: Secondary | ICD-10-CM | POA: Diagnosis not present

## 2022-04-24 DIAGNOSIS — Z8674 Personal history of sudden cardiac arrest: Secondary | ICD-10-CM

## 2022-04-24 DIAGNOSIS — E1043 Type 1 diabetes mellitus with diabetic autonomic (poly)neuropathy: Secondary | ICD-10-CM | POA: Diagnosis present

## 2022-04-24 DIAGNOSIS — E870 Hyperosmolality and hypernatremia: Secondary | ICD-10-CM

## 2022-04-24 DIAGNOSIS — N179 Acute kidney failure, unspecified: Secondary | ICD-10-CM

## 2022-04-24 DIAGNOSIS — K920 Hematemesis: Secondary | ICD-10-CM

## 2022-04-24 DIAGNOSIS — Z8719 Personal history of other diseases of the digestive system: Secondary | ICD-10-CM | POA: Diagnosis not present

## 2022-04-24 DIAGNOSIS — J45909 Unspecified asthma, uncomplicated: Secondary | ICD-10-CM | POA: Diagnosis present

## 2022-04-24 DIAGNOSIS — N183 Chronic kidney disease, stage 3 unspecified: Secondary | ICD-10-CM | POA: Diagnosis not present

## 2022-04-24 DIAGNOSIS — Z9641 Presence of insulin pump (external) (internal): Secondary | ICD-10-CM | POA: Diagnosis present

## 2022-04-24 DIAGNOSIS — Z8249 Family history of ischemic heart disease and other diseases of the circulatory system: Secondary | ICD-10-CM | POA: Diagnosis not present

## 2022-04-24 DIAGNOSIS — K3184 Gastroparesis: Secondary | ICD-10-CM

## 2022-04-24 DIAGNOSIS — I129 Hypertensive chronic kidney disease with stage 1 through stage 4 chronic kidney disease, or unspecified chronic kidney disease: Secondary | ICD-10-CM | POA: Diagnosis not present

## 2022-04-24 DIAGNOSIS — Z880 Allergy status to penicillin: Secondary | ICD-10-CM | POA: Diagnosis not present

## 2022-04-24 DIAGNOSIS — N182 Chronic kidney disease, stage 2 (mild): Secondary | ICD-10-CM | POA: Diagnosis not present

## 2022-04-24 DIAGNOSIS — E1140 Type 2 diabetes mellitus with diabetic neuropathy, unspecified: Secondary | ICD-10-CM | POA: Diagnosis not present

## 2022-04-24 DIAGNOSIS — E1040 Type 1 diabetes mellitus with diabetic neuropathy, unspecified: Secondary | ICD-10-CM | POA: Diagnosis not present

## 2022-04-24 DIAGNOSIS — N184 Chronic kidney disease, stage 4 (severe): Secondary | ICD-10-CM | POA: Diagnosis present

## 2022-04-24 DIAGNOSIS — R1013 Epigastric pain: Secondary | ICD-10-CM | POA: Diagnosis not present

## 2022-04-24 DIAGNOSIS — Z79899 Other long term (current) drug therapy: Secondary | ICD-10-CM | POA: Diagnosis not present

## 2022-04-24 DIAGNOSIS — E860 Dehydration: Secondary | ICD-10-CM | POA: Diagnosis present

## 2022-04-24 DIAGNOSIS — E1022 Type 1 diabetes mellitus with diabetic chronic kidney disease: Secondary | ICD-10-CM | POA: Diagnosis present

## 2022-04-24 LAB — CBC
HCT: 28 % — ABNORMAL LOW (ref 36.0–46.0)
Hemoglobin: 8.8 g/dL — ABNORMAL LOW (ref 12.0–15.0)
MCH: 27.8 pg (ref 26.0–34.0)
MCHC: 31.4 g/dL (ref 30.0–36.0)
MCV: 88.6 fL (ref 80.0–100.0)
Platelets: 221 10*3/uL (ref 150–400)
RBC: 3.16 MIL/uL — ABNORMAL LOW (ref 3.87–5.11)
RDW: 14 % (ref 11.5–15.5)
WBC: 8.1 10*3/uL (ref 4.0–10.5)
nRBC: 0 % (ref 0.0–0.2)

## 2022-04-24 LAB — GLUCOSE, CAPILLARY
Glucose-Capillary: 104 mg/dL — ABNORMAL HIGH (ref 70–99)
Glucose-Capillary: 121 mg/dL — ABNORMAL HIGH (ref 70–99)
Glucose-Capillary: 130 mg/dL — ABNORMAL HIGH (ref 70–99)
Glucose-Capillary: 167 mg/dL — ABNORMAL HIGH (ref 70–99)
Glucose-Capillary: 181 mg/dL — ABNORMAL HIGH (ref 70–99)
Glucose-Capillary: 196 mg/dL — ABNORMAL HIGH (ref 70–99)
Glucose-Capillary: 227 mg/dL — ABNORMAL HIGH (ref 70–99)

## 2022-04-24 LAB — COMPREHENSIVE METABOLIC PANEL
ALT: 10 U/L (ref 0–44)
AST: 10 U/L — ABNORMAL LOW (ref 15–41)
Albumin: 2.8 g/dL — ABNORMAL LOW (ref 3.5–5.0)
Alkaline Phosphatase: 66 U/L (ref 38–126)
Anion gap: 8 (ref 5–15)
BUN: 25 mg/dL — ABNORMAL HIGH (ref 6–20)
CO2: 24 mmol/L (ref 22–32)
Calcium: 8.6 mg/dL — ABNORMAL LOW (ref 8.9–10.3)
Chloride: 116 mmol/L — ABNORMAL HIGH (ref 98–111)
Creatinine, Ser: 3.03 mg/dL — ABNORMAL HIGH (ref 0.44–1.00)
GFR, Estimated: 18 mL/min — ABNORMAL LOW (ref >60)
Glucose, Bld: 210 mg/dL — ABNORMAL HIGH (ref 70–99)
Potassium: 3.9 mmol/L (ref 3.5–5.1)
Sodium: 148 mmol/L — ABNORMAL HIGH (ref 135–145)
Total Bilirubin: 1 mg/dL (ref 0.3–1.2)
Total Protein: 5.9 g/dL — ABNORMAL LOW (ref 6.5–8.1)

## 2022-04-24 MED ORDER — ORAL CARE MOUTH RINSE: 15.0000 mL | OROMUCOSAL | Status: DC | PRN

## 2022-04-24 MED ORDER — DIPHENHYDRAMINE HCL 50 MG/ML IJ SOLN
25.0000 mg | Freq: Four times a day (QID) | INTRAMUSCULAR | Status: DC | PRN
  Administered 2022-04-24: 25 mg via INTRAVENOUS
  Filled 2022-04-24: qty 1

## 2022-04-24 MED ORDER — SODIUM CHLORIDE 0.45 % IV SOLN: INTRAVENOUS | Status: DC

## 2022-04-24 MED ORDER — SCOPOLAMINE 1 MG/3DAYS TD PT72
1.0000 | MEDICATED_PATCH | TRANSDERMAL | Status: DC
  Administered 2022-04-24 – 2022-04-27 (×2): 1.5 mg via TRANSDERMAL
  Filled 2022-04-24 (×2): qty 1

## 2022-04-24 MED ORDER — PANTOPRAZOLE SODIUM 40 MG IV SOLR
40.0000 mg | Freq: Once | INTRAVENOUS | Status: AC
  Administered 2022-04-25: 40 mg via INTRAVENOUS
  Filled 2022-04-24: qty 10

## 2022-04-24 MED ORDER — LORAZEPAM 2 MG/ML IJ SOLN
1.0000 mg | Freq: Four times a day (QID) | INTRAMUSCULAR | Status: DC | PRN
  Administered 2022-04-24 – 2022-04-25 (×3): 1 mg via INTRAVENOUS
  Filled 2022-04-24 (×3): qty 1

## 2022-04-24 MED ORDER — SODIUM CHLORIDE 0.9 % IV SOLN
12.5000 mg | Freq: Four times a day (QID) | INTRAVENOUS | Status: DC | PRN
  Administered 2022-04-24 – 2022-04-25 (×4): 12.5 mg via INTRAVENOUS
  Filled 2022-04-24 (×2): qty 0.5
  Filled 2022-04-24: qty 12.5
  Filled 2022-04-24: qty 0.5
  Filled 2022-04-24: qty 12.5

## 2022-04-24 NOTE — Progress Notes (Signed)
PROGRESS NOTE   [name]  [id number]:[id number]    DOB: July 20, 1971    DOA: 04/23/2022  PCP: [name], PA   I have briefly reviewed patients previous medical records in [facility].  Chief Complaint  Patient presents with   Emesis    Brief Narrative:  51 year old female with medical history significant for type 1 DM with insulin pump, complicated by diabetic neuropathy and gastroparesis, CKD 4, chronic diastolic CHF, HTN, asthma, anxiety and depression, recent hospitalization 04/05/2022 - 04/11/2022 for syncope, cardiac arrest that required on field AED shock, repeat echo showed EF dropped to 40-45%, seen by cardiology and underwent AICD placement 04/09/2022, prior to that admitted 8/28 - 8/30 for hematemesis and EGD revealed grade a esophagitis with active bleeding, presented to the ED on 04/23/2022 with complaints of nausea and vomiting for past 5 days, constant, persistent and had been seen in the ED for same symptoms 2 days prior.  Unable to keep p.o. down including Phenergan.  Admitted for intractable nausea and vomiting, light clear flare of diabetic gastroparesis, hematemesis and acute on chronic kidney disease.   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  EGD 8/29: Grade a reflux esophagitis without bleeding.  GES 04/01/2019: Normal.  Made medication adjustments this morning, added scopolamine patch and as needed IV Ativan for symptoms not relieved by current regimen.  Continue IV Reglan 10 mg every 6 hours scheduled, as needed IV Phenergan 12.5  every 6 hours.  Changed IV fluids to half-normal saline due to hypernatremia.  DC Benadryl to avoid oversedation.  Continue twice daily PPI.  As per GI, no plans for repeat EGD.  GI MD input pending.  Eventual outpatient GES when off antiemetic therapy.  Minimize narcotics.  Advance diet as tolerated.  Hematemesis Concern for hematemesis again with the coffee ground emesis report, HGB 10 on admission is actually better than DC earlier this month. Had hematemesis from esophagitis during admit earlier this month / end of last month.  Continue twice daily PPI.  Slight drop in hemoglobin from 10.2-8.8.  Follow CBC in AM.  GI input as noted above.    Acute kidney injury complicating stage 3b chronic kidney disease (CKD) (HCC) Profound worsening in kidney function over course of this past year it looks like, not actually clear how bad her baseline is given recent hospital stays.  Creatinine has gone up from 2.78-3.03.  Continue IV fluids, avoid nephrotoxic's and follow BMP in AM.  Renal ultrasound without acute findings.  AKI may be multifactorial, prerenal due to nausea/vomiting or even due to ATN but cannot be sure.  Consider nephrology consultation outpatient versus and depending on her creatinine trends.    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  Mildly uncontrolled and fluctuating.  Adjust insulins as needed.  No DKA currently.   Essential hypertension Hold home PO med and continue Labetalol PRN   Chronic systolic CHF (congestive heart failure) (HCC)/s/p cardiac arrest/s/p AICD placement Hold diuretics in setting of AKI and likely dehydration  from N/V. Resume BIDIL and metoprolol PO when able to take POs again.  Recent cardiac arrest, new low EF (40-45% and AICD placement history as noted above.  Clinically euvolemic.  Hypernatremia: May be related to GI losses and IVF hydration.  Changed to half-normal  saline.  Follow BMP in AM.  Body mass index is 23.59 kg/m.    DVT prophylaxis: Place and maintain sequential compression device Start: 04/23/22 2024     Code Status: Full Code:  Family Communication: None at bedside. Disposition:  Status is: Observation The patient will require care spanning > 2 midnights and should be moved to inpatient because: Remains n.p.o., still with intractable nausea and vomiting, ongoing IV fluids and meds as noted above.     Consultants:   [facility]  Procedures:     Antimicrobials:      Subjective:  As per RN report this morning, had ongoing nausea and vomiting despite IV Phenergan at around 3 AM.  Also elevated blood pressures.  When I saw her midmorning, she had received a dose of Ativan 1 mg IV at 8:08 AM, started scopolamine patch, reports feeling better and no further nausea and vomiting but somewhat sleepy but arousable.  Denies abdominal pain.  Objective:   Vitals:   04/24/22 0220 04/24/22 0316 04/24/22 0616 04/24/22 0815  BP: (!) 181/103 (!) 183/106 (!) 196/109 (!) 182/100  Pulse: 99 99 (!) 105 96  Resp:   16 18  Temp: 98 F (36.7 C)  98.7 F (37.1 C)   TempSrc: Oral  Oral   SpO2: 100%  100% 96%  Weight:      Height:        General exam: Young female, moderately built and nourished lying comfortably propped up in bed without distress. Respiratory system: Clear to auscultation. Respiratory effort normal. Cardiovascular system: S1 & S2 heard, RRR. No JVD, murmurs, rubs, gallops or clicks. No pedal edema. Gastrointestinal system: Abdomen is nondistended, soft and nontender. No organomegaly or masses felt. Normal bowel sounds heard. Central nervous system: Sleep/drowsy but easily arousable and oriented.. No focal neurological deficits. Extremities: Symmetric 5 x 5 power. Skin: No rashes, lesions or ulcers Psychiatry: Judgement and insight appear normal. Mood & affect appropriate.     Data Reviewed:   I have personally reviewed  following labs and imaging studies   CBC: Recent Labs  Lab 04/21/22 0941 04/23/22 1242 04/23/22 1251 04/24/22 0541  WBC 6.0 8.6  --  8.1  NEUTROABS  --  7.4  --   --   HGB 9.3* 10.2* 9.9* 8.8*  HCT 28.3* 31.4* 29.0* 28.0*  MCV 85.8 84.4  --  88.6  PLT 246 270  --  179    Basic Metabolic Panel: Recent Labs  Lab 04/21/22 0941 04/23/22 1242 04/23/22 1251 04/24/22 0541  NA 143 143 143 148*  K 4.5 3.8 3.8 3.9  CL 110 107  --  116*  CO2 23 24  --  24  GLUCOSE 132* 310*  --  210*  BUN 32* 23*  --  25*  CREATININE 2.22* 2.78*  --  3.03*  CALCIUM 8.7* 9.4  --  8.6*  MG 1.7  --   --   --     Liver Function Tests: Recent Labs  Lab 04/21/22 0941 04/23/22 1242 04/24/22 0541  AST 11* 10* 10*  ALT $Re'11 8 10  'JQA$ ALKPHOS 71 80 66  BILITOT 0.5 0.9 1.0  PROT 6.7 7.2 5.9*  ALBUMIN 3.6 3.8 2.8*  CBG: Recent Labs  Lab 04/24/22 0042 04/24/22 0502 04/24/22 0816  GLUCAP 121* 196* 227*    Microbiology Studies:   Recent Results (from the past 240 hour(s))  Resp Panel by RT-PCR (Flu A&B, Covid) Anterior Nasal Swab     Status: None   Collection Time: 04/21/22 10:03 AM   Specimen: Anterior Nasal Swab  Result Value Ref Range Status   SARS Coronavirus 2 by RT PCR NEGATIVE NEGATIVE Final    Comment: (NOTE) SARS-CoV-2 target nucleic acids are NOT DETECTED.  The SARS-CoV-2 RNA is generally detectable in upper respiratory specimens during the acute phase of infection. The lowest concentration of SARS-CoV-2 viral copies this assay can detect is 138 copies/mL. A negative result does not preclude SARS-Cov-2 infection and should not be used as the sole basis for treatment or other patient management decisions. A negative result may occur with  improper specimen collection/handling, submission of specimen other than nasopharyngeal swab, presence of viral mutation(s) within the areas targeted by this assay, and inadequate number of viral copies(<138 copies/mL). A negative result  must be combined with clinical observations, patient history, and epidemiological information. The expected result is Negative.  Fact Sheet for Patients:  EntrepreneurPulse.com.au  Fact Sheet for Healthcare Providers:  IncredibleEmployment.be  This test is no t yet approved or cleared by the [place] FDA and  has been authorized for detection and/or diagnosis of SARS-CoV-2 by FDA under an Emergency Use Authorization (EUA). This EUA will remain  in effect (meaning this test can be used) for the duration of the COVID-19 declaration under Section 564(b)(1) of the Act, 21 U.S.C.section 360bbb-3(b)(1), unless the authorization is terminated  or revoked sooner.       Influenza A by PCR NEGATIVE NEGATIVE Final   Influenza B by PCR NEGATIVE NEGATIVE Final    Comment: (NOTE) The Xpert Xpress SARS-CoV-2/FLU/RSV plus assay is intended as an aid in the diagnosis of influenza from Nasopharyngeal swab specimens and should not be used as a sole basis for treatment. Nasal washings and aspirates are unacceptable for Xpert Xpress SARS-CoV-2/FLU/RSV testing.  Fact Sheet for Patients: EntrepreneurPulse.com.au  Fact Sheet for Healthcare Providers: IncredibleEmployment.be  This test is not yet approved or cleared by the [place] FDA and has been authorized for detection and/or diagnosis of SARS-CoV-2 by FDA under an Emergency Use Authorization (EUA). This EUA will remain in effect (meaning this test can be used) for the duration of the COVID-19 declaration under Section 564(b)(1) of the Act, 21 U.S.C. section 360bbb-3(b)(1), unless the authorization is terminated or revoked.  Performed at [facility], [address] [place], [place] [address]     Radiology Studies:  US RENAL  Result Date: 2022-05-14 CLINICAL DATA:  Acute renal insufficiency EXAM: RENAL / URINARY TRACT ULTRASOUND COMPLETE  COMPARISON:  04/21/2022, 04/07/2022 FINDINGS: Right Kidney: Renal measurements: 12.2 x 4.7 by 4.8 cm = volume: 145 mL. Stable benign peripelvic cyst upper pole measuring 1.5 cm. Normal renal echotexture. No hydronephrosis. Left Kidney: Renal measurements: 12.0 x 6.2 x 5.5 cm = volume: 212 mL. Echogenicity within normal limits. No mass or hydronephrosis visualized. Bladder: Appears normal for degree of bladder distention. Other: None. IMPRESSION: 1. Stable renal ultrasound.  No acute findings. Electronically Signed   By: Randa Ngo M.D.   On: 05/14/22 20:35    Scheduled Meds:    insulin aspart  0-9 Units Subcutaneous Q4H   insulin glargine-yfgn  12 Units Subcutaneous Daily   metoCLOPramide (REGLAN) injection  10 mg Intravenous Q6H  pantoprazole (PROTONIX) IV  40 mg Intravenous Q12H   scopolamine  1 patch Transdermal Q72H    Continuous Infusions:    acetaminophen     lactated ringers 125 mL/hr at 04/23/22 2059   promethazine (PHENERGAN) injection (IM or IVPB) 12.5 mg (04/24/22 0310)     LOS: 0 days     Vernell Leep, MD,  FACP, [facility], [facility], [facility] (Care Management Physician Certified) [place]  To contact the attending provider between 7A-7P or the covering provider during after hours 7P-7A, please log into the web site [email or web address] and access using universal [place] password for that web site. If you do not have the password, please call the hospital operator.  04/24/2022, 9:57 AM

## 2022-04-24 NOTE — Progress Notes (Signed)
SDOH Interventions Today    Flowsheet Row Most Recent Value  SDOH Interventions   Food Insecurity Interventions Intervention Not Indicated  Housing Interventions Patient Refused        CSW spoke with pt in regards to [place] food insecurity screening. Pt denies having food insecurity and denies having issues with obtaining food.     Darletta Moll MSW, LCSW Geneticist, molecular

## 2022-04-24 NOTE — Consult Note (Signed)
Referring Provider: Dr. Vernell Leep Primary Care Physician:  [name], [place] Primary Gastroenterologist:  Dr. Wilfrid Lund   Reason for Consultation:  Intractable nausea and vomiting   HPI: [name] is a 51 y.o. female with a past medical history of anxiety, asthma, DM on insulin pump, CKD stage IIIB, diastolic CHF, sickle cell trait, anemia, esophagitis and suspected diabetic gastroparesis.   She was admitted to the hospital 9/1-04/11/2022 with syncope versus cardiac arrest.  She received CPR/AED delivered a shock in the field and she was transported via EMS to the ED. Troponin levels were stable and EKG without acute ischemia.  She was seen by cardiology/EP and underwent AICD placement 04/09/2022 and her cardiac stabilized.  She had ongoing nausea and vomiting without hematemesis during this hospitalization she was seen by our GI service.  Her GI symptoms stabilized on Pantoprazole and Reglan 10 mg IV 4 times daily.  Endoscopic evaluation was not repeated as an EGD was done during her prior hospital admission 04/02/2022. At that time, she was admitted to the hospital 8/28 secondary to N/V with hematemesis and abdominal pain. An EGD was done 04/02/2022 which identified grade A esophagitis without active bleeding. She was discharged home.04/03/2022 on Protonix 40 mg daily and Carafate 1 g twice daily.  She presented to the ED 04/21/2022 with persistent nausea and vomiting x4 days with coffee-ground emesis on 9/17.  Also noted having chest pain.  She was unable to keep any medications down.  Labs in the ED showed a hemoglobin level of 9.3.  Glucose 132.  BUN 32.  Creatinine 2.22.  She received IV Protonix, droperidol and gentle fluid resuscitation.  EKG without acute ischemia.  CTA was negative for aortic dissection/aneurysm, no acute intra-abdominal/pelvic pathology. Her nausea and vomiting subsided and since she had a recent EGD no further endoscopic evaluation was required and she was  discharged home with instructions to continue PPI daily and Phenergan as needed.  She presented to [facility] ED and transferred to [facility] for admission 04/23/2022 with persistent nausea and vomiting.  She did not use Phenergan suppository as previously recommended. Labs in the ED 04/23/2022: WBC 8.6.  Hemoglobin 10.2.  Hematocrit 31.4.  Platelet 270.  Glucose 310.  BUN 23.  Creatinine 2.78 (Cr 222 on 9/17).  AST 10.  ALT 8.  Alk phos 80.  Total bili 0.9. Lipase < 10.  FOBT positive.  Urine drug screen negative.  A GI consult was requested for further management of persistent nausea and vomiting.  She endorses having nonstop nausea and vomiting at home, too many episodes to count.  She reported having coffee-ground emesis for the past 2 days.  No frank red hematemesis.  No severe abdominal pain but she has central upper abdominal tenderness from vomiting.  She continued to retch and vomit throughout the night.  At the bedside, an emesis bag has approximately 200 cc of green bilious fluid.  She is on Reglan 10 mg IV every 6 hours.  She received Phenergan 12.5 mg which provided symptom relief for about 3 hours then had recurrent nausea and vomiting this morning.  A Scopolamine patch and Lorazepam 1 mg IV every 6 hours was ordered by Dr. Algis Liming this morning.  No marijuana or drug use.  Poor p.o. intake at home.  She endorses being compliant with her insulin pump at home.  She passed a normal formed brown bowel movement earlier this morning.  No rectal bleeding or black stools.  She  has residual chest wall pain from recent CPR event as noted above.  No shortness of breath.  No family at the bedside.  Labs 04/24/2022: Hemoglobin 8.8.  Hematocrit 28.  BUN 25.  Creatinine 3.03.  GI PROCEDURES:  EGD 04/02/2022:   -LA Grade A reflux esophagitis with no bleeding. Biopsied. - A few gastric polyps. - No active bleeding.   A. DUODENUM, BIOPSY:  - Duodenal mucosa with normal villous  architecture.  - No villous atrophy or increased intraepithelial lymphocytes.   B. ESOPHAGUS, BIOPSY:  - Squamous mucosa with mild reflux changes  EGD 08/2020:  - LA Grade C reflux esophagitis with no bleeding. - Focal erythematous mucosa in the gastric fundus. Biopsied. - A few gastric polyps. Biopsied. - Normal duodenal bulb and second portion of the duodenum.   A. STOMACH, BODY, POLYPECTOMY:  - Fundic gland polyp.   B. STOMACH, BODY AND ANTRUM, BIOPSY:  - Gastric antral mucosa with mild reactive gastropathy.  - Gastric oxyntic mucosa with mild chronic gastritis.  - Warthin-Starry stain is negative for Helicobacter pylori.   C. STOMACH, FUNDUS, BIOPSY:  - Gastric oxyntic mucosa with mild chronic gastritis.   PAST IMAGE STUDIES:  Chest/abdomen/pelvic CTA 04/21/2022: 1. No evidence for thoracoabdominal aortic aneurysm or dissection. 2. Tiny bilateral pleural effusions with associated compressive atelectasis in both lower lobes. 3. No acute findings in the abdomen or pelvis. 4. Trace intraperitoneal free fluid in the cul-de-sac, nonspecific and potentially physiologic.   Gastric emptying study 04/01/2019: Expected location of the stomach in the left upper quadrant. Ingested meal empties the stomach gradually over the course of the study with 30.8% retention at 60 min and 5.9% retention at 120 min (normal retention less than 30% at a 120 min).  IMPRESSION: Normal gastric emptying study.    Past Medical History:  Diagnosis Date   Anemia    Anxiety    Arthritis    Asthma    CHF (congestive heart failure) ([place])    Depression    Diabetic ulcer of left foot ([place]) 05/12/2013   Generalized abdominal pain    History of chicken pox    Loss of weight 12/02/2019   Migraines    Mood disorder ([place])    anxiety   Prurigo nodularis    with diabetic dermopathy   Type 1 diabetes, uncontrolled, with neuropathy    [place]   Ulcers of both lower legs ([place]) 02/20/2014    Past Surgical  History:  Procedure Laterality Date   BIOPSY  08/11/2020   Procedure: BIOPSY;  Surgeon: Ladene Artist, MD;  Location: [facility];  Service: Endoscopy;;   BIOPSY  04/02/2022   Procedure: BIOPSY;  Surgeon: Jackquline Denmark, MD;  Location: [facility];  Service: Gastroenterology;;   ESOPHAGOGASTRODUODENOSCOPY (EGD) WITH PROPOFOL N/A 08/11/2020   Procedure: ESOPHAGOGASTRODUODENOSCOPY (EGD) WITH PROPOFOL;  Surgeon: Ladene Artist, MD;  Location: [facility];  Service: Endoscopy;  Laterality: N/A;   ESOPHAGOGASTRODUODENOSCOPY (EGD) WITH PROPOFOL N/A 04/02/2022   Procedure: ESOPHAGOGASTRODUODENOSCOPY (EGD) WITH PROPOFOL;  Surgeon: Jackquline Denmark, MD;  Location: [facility];  Service: Gastroenterology;  Laterality: N/A;   ICD IMPLANT N/A 04/09/2022   Procedure: ICD IMPLANT;  Surgeon: Constance Haw, MD;  Location: [facility];  Service: Cardiovascular;  Laterality: N/A;   treadmill stress test  01/2013   WNL, low risk study    Prior to Admission medications   Medication Sig Start Date End Date Taking? Authorizing Provider  acetaminophen (TYLENOL) 500 MG tablet Take 500 mg by mouth  every 6 (six) hours as needed for moderate pain.    [provider]  albuterol (PROAIR HFA) 108 (90 Base) MCG/ACT inhaler Inhale 2 puffs into the lungs every 6 (six) hours as needed for wheezing or shortness of breath. 07/03/20   [name], PA-C  Carboxymethylcellul-Glycerin (LUBRICATING EYE DROPS OP) Place 1 drop into both eyes daily as needed (dry eyes).    [provider]  Continuous Blood Gluc Sensor (FREESTYLE LIBRE 2 SENSOR) MISC Inject 1 Device into the skin every 14 (fourteen) days. 03/20/20   [provider]  hydrOXYzine (ATARAX) 50 MG tablet Take 50 mg by mouth in the morning, at noon, and at bedtime. 10/01/21   [provider]  Insulin Disposable Pump (OMNIPOD 10 PACK) MISC by Does not apply route.    [provider]  isosorbide-hydrALAZINE (BIDIL)  20-37.5 MG tablet Take 1 tablet by mouth 3 (three) times daily. 04/11/22 05/11/22  Darliss Cheney, MD  metoCLOPramide (REGLAN) 10 MG tablet Take 1 tablet (10 mg total) by mouth 3 (three) times daily before meals for 15 days. 04/11/22 04/26/22  Darliss Cheney, MD  metoprolol succinate (TOPROL XL) 25 MG 24 hr tablet Take 1 tablet (25 mg total) by mouth daily. 04/19/22   [name], PA-C  NOVOLOG 100 UNIT/ML injection Inject 0-120 Units into the skin daily. 04/06/20   [provider]  pantoprazole (PROTONIX) 40 MG tablet Take 1 tablet (40 mg total) by mouth daily. 04/03/22 06/02/22  Debbe Odea, MD  polyethylene glycol (MIRALAX / GLYCOLAX) 17 g packet Take 17 g by mouth daily as needed for moderate constipation.    [provider]  pregabalin (LYRICA) 50 MG capsule Take 50 mg by mouth 2 (two) times daily as needed (neuropathy). 12/05/21   [provider]  promethazine (PHENERGAN) 12.5 MG tablet Take 1 tablet (12.5 mg total) by mouth every 6 (six) hours as needed for nausea or vomiting. 04/21/22   [name], MD  PROMETHEGAN 25 MG suppository Place 1 suppository (25 mg total) rectally as directed. 04/21/22   [name], MD  rosuvastatin (CRESTOR) 20 MG tablet Take 1 tablet (20 mg total) by mouth daily. 04/12/22 05/12/22  Darliss Cheney, MD  tobramycin (TOBREX) 0.3 % ophthalmic solution Place 1-2 drops into both eyes See admin instructions. Instill 1-2 drops into both eyes 3 times daily the day before, day of, and the day after monthly eye injections 11/17/20   [provider]  torsemide (DEMADEX) 20 MG tablet Take 20 mg by mouth daily as needed (swelling). 12/25/21   [provider]  TRESIBA FLEXTOUCH 200 UNIT/ML FlexTouch Pen Inject 50 Units into the skin daily as needed (when insulin pump stops working). 11/04/21   [provider]    Current Facility-Administered Medications  Medication Dose Route Frequency Provider Last Rate Last Admin    acetaminophen (OFIRMEV) IV 1,000 mg  1,000 mg Intravenous Q6H PRN Etta Quill, DO       acetaminophen (TYLENOL) tablet 650 mg  650 mg Oral Q6H PRN Etta Quill, DO       Or   acetaminophen (TYLENOL) suppository 650 mg  650 mg Rectal Q6H PRN Etta Quill, DO       diphenhydrAMINE (BENADRYL) injection 25 mg  25 mg Intravenous Q6H PRN Etta Quill, DO   25 mg at 04/24/22 0513   insulin aspart (novoLOG) injection 0-9 Units  0-9 Units Subcutaneous Q4H Etta Quill, DO   2 Units at 04/24/22  3734   insulin glargine-yfgn (SEMGLEE) injection 12 Units  12 Units Subcutaneous Daily Curatolo, Adam, DO   12 Units at 04/23/22 1549   labetalol (NORMODYNE) injection 5-10 mg  5-10 mg Intravenous Q2H PRN Etta Quill, DO   5 mg at 04/24/22 2876   lactated ringers infusion   Intravenous Continuous Etta Quill, DO 125 mL/hr at 04/23/22 2059 New Bag at 04/23/22 2059   metoCLOPramide (REGLAN) injection 10 mg  10 mg Intravenous Q6H Jennette Kettle M, DO   10 mg at 04/24/22 0310   Oral care mouth rinse  15 mL Mouth Rinse PRN Barb Merino, MD       pantoprazole (PROTONIX) injection 40 mg  40 mg Intravenous Q12H Jennette Kettle M, DO   40 mg at 04/23/22 2301   promethazine (PHENERGAN) 12.5 mg in sodium chloride 0.9 % 50 mL IVPB  12.5 mg Intravenous Q6H PRN Etta Quill, DO 200 mL/hr at 04/24/22 0310 12.5 mg at 04/24/22 0310    Allergies as of 04/23/2022 - Review Complete 04/23/2022  Allergen Reaction Noted   Amoxicillin Itching 11/25/2017   Atorvastatin Other (See Comments) 04/23/2021   Dulaglutide Nausea And Vomiting 05/23/2020   Lantus [insulin glargine] Itching and Rash 11/25/2017   Miconazole nitrate Rash 05/23/2020    Family History  Problem Relation Age of Onset   Diabetes Mother        type 2   Hypertension Mother    Thyroid disease Mother    Bipolar disorder Mother    Heart disease Mother    Calcium disorder Mother    Cancer Maternal Grandmother        Breast, stomach    Cancer Paternal Grandmother        stomach, lung (smoker)   Diabetes Paternal Grandmother    Diabetes Paternal Grandfather    Heart failure Sister    Diabetes Sister    Stroke Maternal Aunt    Cancer Maternal Uncle        prostate   CAD Maternal Aunt        stents   Cancer Maternal Aunt 64       ovarian    Social History   Socioeconomic History   Marital status: Divorced    Spouse name: Not on file   Number of children: 2   Years of education: Not on file   Highest education level: Not on file  Occupational History   Occupation: Freight forwarder  Tobacco Use   Smoking status: Never   Smokeless tobacco: Never  Vaping Use   Vaping Use: Never used  Substance and Sexual Activity   Alcohol use: Not Currently   Drug use: No   Sexual activity: Yes    Partners: Male    Birth control/protection: None  Other Topics Concern   Not on file  Social History Narrative   Caffeine use: daily   Occupation: cleans houses   Regular exercise: yes, active 5x/wk   Diet: good water, fruits/vegetables daily   Social Determinants of Health   Financial Resource Strain: Not on file  Food Insecurity: Food Insecurity Present (04/23/2022)   Hunger Vital Sign    Worried About Running Out of Food in the Last Year: Sometimes true    Ran Out of Food in the Last Year: Sometimes true  Transportation Needs: No Transportation Needs (04/23/2022)   PRAPARE - Hydrologist (Medical): No    Lack of Transportation (Non-Medical): No  Physical Activity: Not on file  Stress: Not on file  Social Connections: Not on file  Intimate Partner Violence: Not At Risk (04/23/2022)   Humiliation, Afraid, Rape, and Kick questionnaire    Fear of Current or Ex-Partner: No    Emotionally Abused: No    Physically Abused: No    Sexually Abused: No    Review of Systems: Gen: Denies fever, sweats or chills. +  Weight loss. CV: + Chest soreness. No palpitations or edema. Resp: Denies cough,  shortness of breath of hemoptysis.  GI: See HPI. GU : Denies urinary burning, blood in urine, increased urinary frequency or incontinence. MS: Denies joint pain, muscles aches or weakness. Derm: Denies rash, itchiness, skin lesions or unhealing ulcers. Psych: + Anxiety.  Heme: Denies easy bruising, bleeding. Neuro:  Denies headaches, dizziness or paresthesias. Endo:  + DM I.  Physical Exam: Vital signs in last 24 hours: Temp:  [98 F (36.7 C)-98.7 F (37.1 C)] 98.7 F (37.1 C) (09/20 0616) Pulse Rate:  [81-105] 105 (09/20 0616) Resp:  [16-26] 16 (09/20 0616) BP: (143-209)/(79-109) 196/109 (09/20 0616) SpO2:  [88 %-100 %] 100 % (09/20 0616) Weight:  [78.9 kg] 78.9 kg (09/19 1223) Last BM Date : 04/21/22 (per patient)  General:  Alert fatigued 51 year old female with slight retching at this time in no acute distress. Head:  Normocephalic and atraumatic. Eyes:  No scleral icterus. Conjunctiva pink. Ears:  Normal auditory acuity. Nose:  No deformity, discharge or lesions. Mouth:  Dentition intact. No ulcers or lesions.  Neck:  Supple. No lymphadenopathy or thyromegaly.  Lungs: Breath sounds clear throughout. Heart: Tachycardic, no murmurs. AICD to left chest wall. Abdomen: Epigastric tenderness without rebound or guarding.  Positive bowel sounds to all 4 quadrants. Rectal: Deferred. Musculoskeletal:  Symmetrical without gross deformities.  Pulses:  Normal pulses noted. Extremities:  Without clubbing or edema. Neurologic:  Alert and  oriented x 4. No focal deficits.  Skin:  Intact without significant lesions or rashes. Psych:  Alert and cooperative. Normal mood and affect.  Intake/Output from previous day: 09/19 0701 - 09/20 0700 In: 1000 [IV Piggyback:1000] Out: 300 [Urine:300] Intake/Output this shift: Total I/O In: -  Out: 300 [Urine:300]  Lab Results: Recent Labs    04/21/22 0941 04/23/22 1242 04/23/22 1251 04/24/22 0541  WBC 6.0 8.6  --  8.1  HGB 9.3* 10.2*  9.9* 8.8*  HCT 28.3* 31.4* 29.0* 28.0*  PLT 246 270  --  221   BMET Recent Labs    04/21/22 0941 04/23/22 1242 04/23/22 1251 04/24/22 0541  NA 143 143 143 148*  K 4.5 3.8 3.8 3.9  CL 110 107  --  116*  CO2 23 24  --  24  GLUCOSE 132* 310*  --  210*  BUN 32* 23*  --  25*  CREATININE 2.22* 2.78*  --  3.03*  CALCIUM 8.7* 9.4  --  8.6*   LFT Recent Labs    04/24/22 0541  PROT 5.9*  ALBUMIN 2.8*  AST 10*  ALT 10  ALKPHOS 66  BILITOT 1.0   PT/INR Recent Labs    04/21/22 1057  LABPROT 12.2  INR 0.9   Hepatitis Panel No results for input(s): "HEPBSAG", "HCVAB", "HEPAIGM", "HEPBIGM" in the last 72 hours.    Studies/Results: US RENAL  Result Date: 04/23/2022 CLINICAL DATA:  Acute renal insufficiency EXAM: RENAL / URINARY TRACT ULTRASOUND COMPLETE COMPARISON:  04/21/2022, 04/07/2022 FINDINGS: Right Kidney: Renal measurements: 12.2 x 4.7 by 4.8 cm = volume: 145 mL. Stable benign peripelvic cyst upper  pole measuring 1.5 cm. Normal renal echotexture. No hydronephrosis. Left Kidney: Renal measurements: 12.0 x 6.2 x 5.5 cm = volume: 212 mL. Echogenicity within normal limits. No mass or hydronephrosis visualized. Bladder: Appears normal for degree of bladder distention. Other: None. IMPRESSION: 1. Stable renal ultrasound.  No acute findings. Electronically Signed   By: Randa Ngo M.D.   On: 04/23/2022 20:35    IMPRESSION/PLAN:  51 year old female with  type I DM readmitted to the hospital with chronic nausea/vomiting secondary to suspected gastroparesis and esophagitis.  EGD 04/02/2022 showed grade A reflux esophagitis without bleeding.  Head CT negative 04/08/2022.  CTA 04/21/2022 without acute intra-abdominal/pelvic pathology.  Normal gastric emptying study 04/01/2019: -Continue Reglan 10 mg IV every 6 hours -Continue Phenergan 12.5 mg IV every 6 hours -? Hold Benadryl IV if she is receiving Phenergan IV -Lorazepam 19m IV Q 6hrs and Scopolamine patch ordered by the  hospitalist -Continue Pantoprazole 40 mg twice daily -No plans for repeat EGD at this time -Eventual outpatient gastric emptying study when off antiemetic therapy -Await further recommendations per Dr. MRush Landmark Chronic anemia secondary to CKD. Patent endorses have coffee-ground emesis.  She had nonbloody bilious emesis this morning collected in the emesis bag. -CBC in a.m. -Continue to monitor the patient for active GI bleed  AKI on CKD stage III  S/P cardiac arrest s/p AICD placement   Chronic systolic CHF. LVEF 40 - 45%.    CPatrecia PourKennedy-Smith  04/24/2022, 9:08AM

## 2022-04-25 DIAGNOSIS — E1140 Type 2 diabetes mellitus with diabetic neuropathy, unspecified: Secondary | ICD-10-CM

## 2022-04-25 DIAGNOSIS — K920 Hematemesis: Secondary | ICD-10-CM

## 2022-04-25 DIAGNOSIS — R112 Nausea with vomiting, unspecified: Secondary | ICD-10-CM

## 2022-04-25 DIAGNOSIS — D649 Anemia, unspecified: Secondary | ICD-10-CM

## 2022-04-25 DIAGNOSIS — E1143 Type 2 diabetes mellitus with diabetic autonomic (poly)neuropathy: Secondary | ICD-10-CM | POA: Diagnosis not present

## 2022-04-25 DIAGNOSIS — I110 Hypertensive heart disease with heart failure: Secondary | ICD-10-CM

## 2022-04-25 DIAGNOSIS — Z8719 Personal history of other diseases of the digestive system: Secondary | ICD-10-CM

## 2022-04-25 DIAGNOSIS — N183 Chronic kidney disease, stage 3 unspecified: Secondary | ICD-10-CM

## 2022-04-25 DIAGNOSIS — N179 Acute kidney failure, unspecified: Secondary | ICD-10-CM | POA: Diagnosis not present

## 2022-04-25 DIAGNOSIS — K3184 Gastroparesis: Secondary | ICD-10-CM

## 2022-04-25 DIAGNOSIS — D638 Anemia in other chronic diseases classified elsewhere: Secondary | ICD-10-CM

## 2022-04-25 DIAGNOSIS — Z95810 Presence of automatic (implantable) cardiac defibrillator: Secondary | ICD-10-CM

## 2022-04-25 DIAGNOSIS — Z8674 Personal history of sudden cardiac arrest: Secondary | ICD-10-CM

## 2022-04-25 DIAGNOSIS — I10 Essential (primary) hypertension: Secondary | ICD-10-CM

## 2022-04-25 DIAGNOSIS — N182 Chronic kidney disease, stage 2 (mild): Secondary | ICD-10-CM

## 2022-04-25 LAB — GLUCOSE, CAPILLARY
Glucose-Capillary: 155 mg/dL — ABNORMAL HIGH (ref 70–99)
Glucose-Capillary: 149 mg/dL — ABNORMAL HIGH (ref 70–99)
Glucose-Capillary: 131 mg/dL — ABNORMAL HIGH (ref 70–99)
Glucose-Capillary: 117 mg/dL — ABNORMAL HIGH (ref 70–99)
Glucose-Capillary: 121 mg/dL — ABNORMAL HIGH (ref 70–99)
Glucose-Capillary: 128 mg/dL — ABNORMAL HIGH (ref 70–99)

## 2022-04-25 LAB — BASIC METABOLIC PANEL
Anion gap: 4 — ABNORMAL LOW (ref 5–15)
Anion gap: 6 (ref 5–15)
BUN: 15 mg/dL (ref 6–20)
BUN: 19 mg/dL (ref 6–20)
CO2: 17 mmol/L — ABNORMAL LOW (ref 22–32)
CO2: 22 mmol/L (ref 22–32)
Calcium: 5.9 mg/dL — CL (ref 8.9–10.3)
Calcium: 8.2 mg/dL — ABNORMAL LOW (ref 8.9–10.3)
Chloride: 105 mmol/L (ref 98–111)
Chloride: 114 mmol/L — ABNORMAL HIGH (ref 98–111)
Creatinine, Ser: 2.01 mg/dL — ABNORMAL HIGH (ref 0.44–1.00)
Creatinine, Ser: 2.77 mg/dL — ABNORMAL HIGH (ref 0.44–1.00)
GFR, Estimated: 30 mL/min — ABNORMAL LOW (ref >60)
GFR, Estimated: 20 mL/min — ABNORMAL LOW (ref >60)
Glucose, Bld: 112 mg/dL — ABNORMAL HIGH (ref 70–99)
Glucose, Bld: 173 mg/dL — ABNORMAL HIGH (ref 70–99)
Potassium: 2.5 mmol/L — CL (ref 3.5–5.1)
Potassium: 4.1 mmol/L (ref 3.5–5.1)
Sodium: 126 mmol/L — ABNORMAL LOW (ref 135–145)
Sodium: 142 mmol/L (ref 135–145)

## 2022-04-25 LAB — CBC
HCT: 25.7 % — ABNORMAL LOW (ref 36.0–46.0)
Hemoglobin: 7.9 g/dL — ABNORMAL LOW (ref 12.0–15.0)
MCH: 27.9 pg (ref 26.0–34.0)
MCHC: 30.7 g/dL (ref 30.0–36.0)
MCV: 90.8 fL (ref 80.0–100.0)
Platelets: 205 10*3/uL (ref 150–400)
RBC: 2.83 MIL/uL — ABNORMAL LOW (ref 3.87–5.11)
RDW: 14.1 % (ref 11.5–15.5)
WBC: 7.1 10*3/uL (ref 4.0–10.5)
nRBC: 0 % (ref 0.0–0.2)

## 2022-04-25 LAB — MAGNESIUM: Magnesium: 1.8 mg/dL (ref 1.7–2.4)

## 2022-04-25 MED ORDER — LABETALOL HCL 5 MG/ML IV SOLN: 10.0000 mg | INTRAVENOUS | Status: DC | PRN

## 2022-04-25 MED ORDER — CALCIUM GLUCONATE-NACL 1-0.675 GM/50ML-% IV SOLN: 1.0000 g | INTRAVENOUS | Status: DC

## 2022-04-25 MED ORDER — ISOSORB DINITRATE-HYDRALAZINE 20-37.5 MG PO TABS
1.0000 | ORAL_TABLET | Freq: Three times a day (TID) | ORAL | Status: DC
  Administered 2022-04-25 – 2022-04-26 (×2): 1 via ORAL
  Filled 2022-04-25 (×5): qty 1

## 2022-04-25 MED ORDER — CALCIUM GLUCONATE-NACL 1-0.675 GM/50ML-% IV SOLN: 1.0000 g | Freq: Once | INTRAVENOUS | Status: DC

## 2022-04-25 MED ORDER — CLONIDINE HCL 0.1 MG/24HR TD PTWK: 0.1000 mg | MEDICATED_PATCH | TRANSDERMAL | Status: DC

## 2022-04-25 MED ORDER — DIPHENHYDRAMINE HCL 50 MG/ML IJ SOLN
12.5000 mg | Freq: Once | INTRAMUSCULAR | Status: AC
  Administered 2022-04-25: 12.5 mg via INTRAVENOUS
  Filled 2022-04-25: qty 1

## 2022-04-25 MED ORDER — POLYETHYLENE GLYCOL 3350 17 G PO PACK: 17.0000 g | PACK | Freq: Every day | ORAL | Status: DC | PRN

## 2022-04-25 MED ORDER — BISACODYL 10 MG RE SUPP: 10.0000 mg | Freq: Every day | RECTAL | Status: DC | PRN

## 2022-04-25 MED ORDER — POLYVINYL ALCOHOL 1.4 % OP SOLN: 2.0000 [drp] | Freq: Every day | OPHTHALMIC | Status: DC | PRN

## 2022-04-25 MED ORDER — METOPROLOL SUCCINATE ER 25 MG PO TB24
25.0000 mg | ORAL_TABLET | Freq: Every day | ORAL | Status: DC
  Administered 2022-04-25: 25 mg via ORAL
  Filled 2022-04-25: qty 1

## 2022-04-25 MED ORDER — POTASSIUM CHLORIDE CRYS ER 20 MEQ PO TBCR
40.0000 meq | EXTENDED_RELEASE_TABLET | Freq: Once | ORAL | Status: AC
  Administered 2022-04-25: 40 meq via ORAL
  Filled 2022-04-25: qty 2

## 2022-04-25 MED ORDER — CALCIUM GLUCONATE-NACL 2-0.675 GM/100ML-% IV SOLN
2.0000 g | Freq: Once | INTRAVENOUS | Status: AC
  Administered 2022-04-25: 2000 mg via INTRAVENOUS
  Filled 2022-04-25: qty 100

## 2022-04-25 MED ORDER — POTASSIUM CHLORIDE 10 MEQ/100ML IV SOLN
10.0000 meq | INTRAVENOUS | Status: AC
  Administered 2022-04-25 (×4): 10 meq via INTRAVENOUS
  Filled 2022-04-25 (×4): qty 100

## 2022-04-25 MED ORDER — METOPROLOL TARTRATE 5 MG/5ML IV SOLN
5.0000 mg | Freq: Four times a day (QID) | INTRAVENOUS | Status: DC
  Administered 2022-04-25 – 2022-04-27 (×8): 5 mg via INTRAVENOUS
  Filled 2022-04-25 (×8): qty 5

## 2022-04-25 MED ORDER — SODIUM CHLORIDE 0.9 % IV SOLN
12.5000 mg | Freq: Three times a day (TID) | INTRAVENOUS | Status: DC
  Administered 2022-04-25 – 2022-04-26 (×3): 12.5 mg via INTRAVENOUS
  Filled 2022-04-25 (×3): qty 12.5
  Filled 2022-04-25: qty 0.5

## 2022-04-25 MED ORDER — HYDRALAZINE HCL 20 MG/ML IJ SOLN
10.0000 mg | INTRAMUSCULAR | Status: DC | PRN
  Administered 2022-04-25 – 2022-04-26 (×2): 10 mg via INTRAVENOUS
  Filled 2022-04-25 (×2): qty 1

## 2022-04-25 MED ORDER — PANTOPRAZOLE SODIUM 40 MG IV SOLR
40.0000 mg | Freq: Two times a day (BID) | INTRAVENOUS | Status: DC
  Administered 2022-04-25 – 2022-04-28 (×7): 40 mg via INTRAVENOUS
  Filled 2022-04-25 (×7): qty 10

## 2022-04-25 MED ORDER — ROSUVASTATIN CALCIUM 10 MG PO TABS
20.0000 mg | ORAL_TABLET | Freq: Every day | ORAL | Status: DC
  Administered 2022-04-25 – 2022-04-29 (×5): 20 mg via ORAL
  Filled 2022-04-25 (×5): qty 2

## 2022-04-25 MED ORDER — LACTATED RINGERS IV SOLN: INTRAVENOUS | Status: DC

## 2022-04-25 NOTE — Progress Notes (Signed)
Initial Nutrition Assessment  INTERVENTION:   Once diet advanced: -Ensure MAX Protein po BID, each supplement provides 150 kcal and 30 grams of protein  -Multivitamin with minerals daily  -Provided "Gastroparesis" handout to patient and reviewed.  NUTRITION DIAGNOSIS:   Inadequate oral intake related to nausea, vomiting (gastroparesis) as evidenced by per patient/family report.  GOAL:   Patient will meet greater than or equal to 90% of their needs  MONITOR:   PO intake, Supplement acceptance, Weight trends, I & O's, Labs, Diet advancement  REASON FOR ASSESSMENT:   Consult Diet education  ASSESSMENT:   51 year old female with medical history significant for type 1 DM with insulin pump, complicated by diabetic neuropathy and gastroparesis, CKD 4, chronic diastolic CHF, HTN, asthma, anxiety and depression, recent hospitalization 04/05/2022 - 04/11/2022 for syncope, cardiac arrest that required on field AED shock, repeat echo showed EF dropped to 40-45%, seen by cardiology and underwent AICD placement 04/09/2022, prior to that admitted 8/28 - 8/30 for hematemesis and EGD revealed grade a esophagitis with active bleeding, presented to the ED on 04/23/2022 with complaints of nausea and vomiting for past 5 days, constant, persistent and had been seen in the ED for same symptoms 2 days prior.  Unable to keep p.o. down including Phenergan.  Admitted for intractable nausea and vomiting, light clear flare of diabetic gastroparesis, hematemesis and acute on chronic kidney disease.  Patient in room, very ill-appearing. Reports she is still experiencing a lot of nausea and has been vomiting. Had N/V x 5 days PTA. States she is not tolerating liquids. Tried [employer] yesterday and it came back up. Discouraged her from drinking this supplement.  If patient is able to have diet advanced, will order Ensure Max supplements. Provided handout on gastroparesis diet, reviewed principles with patient.   Per  weight records, pt has lost 17 lb since 5/12 (8% wt loss x 4.5 months, insignificant for time frame).   Medications: Reglan, KLOR-CON, Calcium gluconate, KCl, Phenergan  Labs reviewed: CBGs: 104-167   NUTRITION - FOCUSED PHYSICAL EXAM:  Deferred given nausea  Diet Order:   Diet Order             Diet clear liquid Room service appropriate? Yes; Fluid consistency: Thin  Diet effective now                   EDUCATION NEEDS:   Education needs have been addressed  Skin:     Last BM:  9/20 -type 7  Height:   Ht Readings from Last 1 Encounters:  04/23/22 6' (1.829 m)    Weight:   Wt Readings from Last 1 Encounters:  04/23/22 78.9 kg    BMI:  Body mass index is 23.59 kg/m.  Estimated Nutritional Needs:   Kcal:  2000-2200  Protein:  105-120g  Fluid:  2L/day  Clayton Bibles, MS, RD, LDN Inpatient Clinical Dietitian Contact information available via Amion

## 2022-04-25 NOTE — Progress Notes (Signed)
Patient not tolerating clear liquid diet, cannot keep fluids down. No relief with anti-emetic medications.

## 2022-04-25 NOTE — Progress Notes (Addendum)
PROGRESS NOTE   [name]  HQI:[id number]    DOB: 1971-07-19    DOA: 04/23/2022  PCP: [name], PA   I have briefly reviewed patients previous medical records in [facility].  Chief Complaint  Patient presents with   Emesis    Brief Narrative:  51 year old female with medical history significant for type 1 DM with insulin pump, complicated by diabetic neuropathy and gastroparesis, CKD 4, chronic diastolic CHF, HTN, asthma, anxiety and depression, recent hospitalization 04/05/2022 - 04/11/2022 for syncope, cardiac arrest that required on field AED shock, repeat echo showed EF dropped to 40-45%, seen by cardiology and underwent AICD placement 04/09/2022, prior to that admitted 8/28 - 8/30 for hematemesis and EGD revealed grade a esophagitis with active bleeding, presented to the ED on 04/23/2022 with complaints of nausea and vomiting for past 5 days, constant, persistent and had been seen in the ED for same symptoms 2 days prior.  Unable to keep p.o. down including Phenergan.  Admitted for intractable nausea and vomiting, light clear flare of diabetic gastroparesis, hematemesis and acute on chronic kidney disease.  [facility] consulting and assisting with care.   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  EGD 8/29: Grade a reflux esophagitis without bleeding.  GES 04/01/2019: Normal.  Continue PPI IV twice daily until able to tolerate orally, scopolamine patch every 72 hours, scheduled  IV Reglan 10 mg every 6 hours.  Completed a day of as needed IV Ativan for refractory vomiting, will not renew.  GI follow-up appreciated, ongoing nausea and vomiting, have changed IV Phenergan to scheduled every 8 hours.  Remains on clear liquids.  As per GI, no plans for repeat EGD.  GI MD input pending.  Eventual outpatient GES when off antiemetic therapy.  Minimize narcotics.  Advance diet as tolerated.  Hematemesis Concern for hematemesis again with the coffee ground emesis report, HGB 10 on admission is actually better than DC earlier this month. Had hematemesis from esophagitis during admit earlier this month / end of last month.  Continue twice daily PPI.  Slight drop in hemoglobin from 10.2-8.8-7.8.  GI input as noted above.  Clinically without hematemesis.  Some of the drop in hemoglobin may be dilutional.  Follow CBC daily and transfuse if hemoglobin 7 g or less.   Acute kidney injury complicating stage 3b chronic kidney disease (CKD) (HCC) Profound worsening in kidney function over course of this past year it looks like, not actually clear how bad her baseline is given recent hospital stays.  Creatinine has gone up from 2.78-3.03.  Continue IV fluids, avoid nephrotoxic's and follow BMP daily.  Renal ultrasound without acute findings.  AKI may be multifactorial, prerenal due to nausea/vomiting or even due to ATN but cannot be sure.  Erroneous BMP this morning which was repeated.  Creatinine down to 2.77.    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  Reason for inpatient control.  Adjust insulins as needed.  No DKA currently.   Essential hypertension Resumed prior p.o. meds including BiDil and metoprolol.  However unable to tolerate her p.o. meds.  As per nursing even though she took her a.m. dose of metoprolol and BiDil, she vomited it up.  Switch labetalol to as needed IV hydralazine and started  scheduled IV metoprolol 5 Mg every 6 hours.  Will place her back on telemetry.   Chronic systolic CHF (congestive heart failure) (HCC)/s/p cardiac arrest/s/p AICD placement Hold diuretics in setting of AKI and likely dehydration from N/V. Resumed BiDil and metoprolol.  Recent cardiac arrest, improved LVEF, 50-55% , low normal by TTE 9/3. AICD placement history as noted above.  Clinically euvolemic.  Hypernatremia: May be related to GI losses and IVF hydration.  Changed to half-normal saline.  Resolved.  Continue LR infusion.  Body mass index is 23.59 kg/m.    DVT prophylaxis: Place and maintain sequential compression device Start: 04/23/22 2024     Code Status: Full Code:  Family Communication: None at bedside. Disposition:  Status is: Inpatient.     Consultants:   [facility]  Procedures:     Antimicrobials:      Subjective:  Reports feeling somewhat better but still states that she had 7-8 bilious/frothy emesis over the last 24 hours but no coffee-ground emesis or blood.  Some abdominal pain.  Objective:   Vitals:   04/24/22 1435 04/24/22 1927 04/25/22 0410 04/25/22 0527  BP: (!) 183/114 137/70 (!) 192/103 (!) 175/98  Pulse: 96 88 (!) 101 90  Resp: $Remo'17 18 16   'wVNzN$ Temp: 98.8 F (37.1 C) 98.8 F (37.1 C) 98.4 F (36.9 C)   TempSrc: Oral Oral Oral   SpO2: 100% 100% 100%   Weight:      Height:        General exam: Young female, moderately built and nourished lying comfortably propped up in bed without distress. Respiratory system: Clear to auscultation.  No increased work of breathing. Cardiovascular system: S1 & S2 heard, RRR. No JVD, murmurs, rubs, gallops or clicks. No pedal edema.  Telemetry personally reviewed: Sinus rhythm. Gastrointestinal system: Abdomen is nondistended, soft and nontender. No organomegaly or masses felt. Normal bowel sounds heard. Central nervous system: Alert and oriented this morning. No focal neurological deficits. Extremities: Symmetric 5  x 5 power. Skin: No rashes, lesions or ulcers Psychiatry: Judgement and insight appear normal. Mood & affect appropriate.     Data Reviewed:   I have personally reviewed following labs and imaging studies   CBC: Recent Labs  Lab 04/23/22 1242 04/23/22 1251 04/24/22 0541 04/25/22 0523  WBC 8.6  --  8.1 7.1  NEUTROABS 7.4  --   --   --   HGB 10.2* 9.9* 8.8* 7.9*  HCT 31.4* 29.0* 28.0* 25.7*  MCV 84.4  --  88.6 90.8  PLT 270  --  221 511    Basic Metabolic Panel: Recent Labs  Lab 04/21/22 0941 04/23/22 1242 04/23/22 1251 04/24/22 0541 04/25/22 0523 04/25/22 0740  NA 143 143 143 148* 126* 142  K 4.5 3.8 3.8 3.9 2.5* 4.1  CL 110 107  --  116* 105 114*  CO2 23 24  --  24 17* 22  GLUCOSE 132* 310*  --  210* 112* 173*  BUN 32* 23*  --  25* 15 19  CREATININE 2.22* 2.78*  --  3.03* 2.01* 2.77*  CALCIUM 8.7* 9.4  --  8.6* 5.9* 8.2*  MG 1.7  --   --   --   --  1.8    Liver Function Tests: Recent Labs  Lab 04/21/22 0941 04-25-2022 1242 04/24/22 0541  AST 11* 10* 10*  ALT $Re'11 8 10  'SZP$ ALKPHOS 71 80 66  BILITOT 0.5 0.9 1.0  PROT 6.7 7.2 5.9*  ALBUMIN 3.6 3.8 2.8*    CBG: Recent Labs  Lab 04/25/22 0348 04/25/22 0759 04/25/22 1145  GLUCAP 155* 149* 131*    Microbiology Studies:   Recent Results (from the past 240 hour(s))  Resp Panel by RT-PCR (Flu A&B, Covid) Anterior Nasal Swab     Status: None   Collection Time: 04/21/22 10:03 AM   Specimen: Anterior Nasal Swab  Result Value Ref Range Status   SARS Coronavirus 2 by RT PCR NEGATIVE NEGATIVE Final    Comment: (NOTE) SARS-CoV-2 target nucleic acids are NOT DETECTED.  The SARS-CoV-2 RNA is generally detectable in upper respiratory specimens during the acute phase of infection. The lowest concentration of SARS-CoV-2 viral copies this assay can detect is 138 copies/mL. A negative result does not preclude SARS-Cov-2 infection and should not be used as the sole basis for treatment or other patient management  decisions. A negative result may occur with  improper specimen collection/handling, submission of specimen other than nasopharyngeal swab, presence of viral mutation(s) within the areas targeted by this assay, and inadequate number of viral copies(<138 copies/mL). A negative result must be combined with clinical observations, patient history, and epidemiological information. The expected result is Negative.  Fact Sheet for Patients:  EntrepreneurPulse.com.au  Fact Sheet for Healthcare Providers:  IncredibleEmployment.be  This test is no t yet approved or cleared by the [place] FDA and  has been authorized for detection and/or diagnosis of SARS-CoV-2 by FDA under an Emergency Use Authorization (EUA). This EUA will remain  in effect (meaning this test can be used) for the duration of the COVID-19 declaration under Section 564(b)(1) of the Act, 21 U.S.C.section 360bbb-3(b)(1), unless the authorization is terminated  or revoked sooner.       Influenza A by PCR NEGATIVE NEGATIVE Final   Influenza B by PCR NEGATIVE NEGATIVE Final    Comment: (NOTE) The Xpert Xpress SARS-CoV-2/FLU/RSV plus assay is intended as an aid in the diagnosis of influenza from Nasopharyngeal swab specimens and should not be used as a sole basis for treatment. Nasal washings and aspirates are unacceptable for Xpert Xpress SARS-CoV-2/FLU/RSV testing.  Fact Sheet for Patients: EntrepreneurPulse.com.au  Fact Sheet for Healthcare Providers: IncredibleEmployment.be  This test is not yet approved or cleared by the [place] FDA and has been authorized for detection and/or diagnosis of SARS-CoV-2 by FDA under an Emergency Use Authorization (EUA). This EUA will remain in effect (meaning this test can be used) for the duration of the COVID-19 declaration under Section 564(b)(1) of the Act, 21 U.S.C. section 360bbb-3(b)(1), unless the  authorization is terminated or revoked.  Performed at [facility], [address], [place], [place] [address]     Radiology Studies:  US RENAL  Result Date: 2022/04/25 CLINICAL DATA:  Acute renal insufficiency EXAM: RENAL / URINARY TRACT ULTRASOUND COMPLETE COMPARISON:  04/21/2022, 04/07/2022 FINDINGS: Right Kidney: Renal measurements: 12.2 x 4.7 by 4.8 cm = volume: 145 mL. Stable benign peripelvic cyst upper pole measuring 1.5 cm. Normal renal echotexture. No hydronephrosis. Left Kidney: Renal measurements: 12.0 x 6.2 x 5.5 cm = volume: 212 mL. Echogenicity within normal limits. No mass or hydronephrosis visualized. Bladder: Appears normal for degree of bladder distention. Other: None. IMPRESSION: 1. Stable renal ultrasound.  No acute  findings. Electronically Signed   By: Randa Ngo M.D.   On: 04/23/2022 20:35    Scheduled Meds:    insulin aspart  0-9 Units Subcutaneous Q4H   insulin glargine-yfgn  12 Units Subcutaneous Daily   isosorbide-hydrALAZINE  1 tablet Oral TID   metoCLOPramide (REGLAN) injection  10 mg Intravenous Q6H   metoprolol succinate  25 mg Oral Daily   pantoprazole (PROTONIX) IV  40 mg Intravenous Q12H   rosuvastatin  20 mg Oral Daily   scopolamine  1 patch Transdermal Q72H    Continuous Infusions:    promethazine (PHENERGAN) injection (IM or IVPB)       LOS: 1 day     Vernell Leep, MD,  FACP, [facility], [facility], [facility] (Care Management Physician Certified) [place]  To contact the attending provider between 7A-7P or the covering provider during after hours 7P-7A, please log into the web site [email or web address] and access using universal [place] password for that web site. If you do not have the password, please call the hospital operator.  04/25/2022, 2:43 PM

## 2022-04-25 NOTE — Progress Notes (Addendum)
Progress Note  Primary GI: Dr. Loletha Carrow   Subjective  Chief Complaint: Intractable nausea and vomiting   Patient lying in bed stating she had a difficult night.  States she vomited throughout the night feeling 2 emesis bags.  Patient states her's abdominal abdomen sore from vomiting.  Denies any bloody emesis.   Had bowel movement 4 hours prior soft formed, denies hematochezia or melena. Did have 1 episode of emesis while is in the room which involves coughing and spitting up.    Objective   Vital signs in last 24 hours: Temp:  [98.4 F (36.9 C)-98.8 F (37.1 C)] 98.4 F (36.9 C) (09/21 0410) Pulse Rate:  [88-101] 90 (09/21 0527) Resp:  [16-18] 16 (09/21 0410) BP: (137-192)/(70-114) 175/98 (09/21 0527) SpO2:  [100 %] 100 % (09/21 0410) Last BM Date : 04/24/22 Last BM recorded by nurses in past 5 days Stool Type: Type 7 (Liquid consistency with no solid pieces) (04/24/2022  9:39 PM)  General:   female in no acute distress, appears fatigued Heart:  Regular rate and rhythm; no murmurs Pulm: Clear anteriorly; no wheezing Abdomen:  Soft, Non-distended AB, Hypoactive bowel sounds. mild tenderness in the entire abdomen. Without guarding and Without rebound, No organomegaly appreciated. Extremities:  without  edema. Neurologic:  Alert and  oriented x4;  No focal deficits.  Psych:  Cooperative. Normal mood and affect.  Intake/Output from previous day: 09/20 0701 - 09/21 0700 In: 770.8 [I.V.:720.8; IV Piggyback:50] Out: 200 [Emesis/NG output:200] Intake/Output this shift: No intake/output data recorded.  Studies/Results: US RENAL  Result Date: 04/23/2022 CLINICAL DATA:  Acute renal insufficiency EXAM: RENAL / URINARY TRACT ULTRASOUND COMPLETE COMPARISON:  04/21/2022, 04/07/2022 FINDINGS: Right Kidney: Renal measurements: 12.2 x 4.7 by 4.8 cm = volume: 145 mL. Stable benign peripelvic cyst upper pole measuring 1.5 cm. Normal renal echotexture. No hydronephrosis. Left Kidney: Renal  measurements: 12.0 x 6.2 x 5.5 cm = volume: 212 mL. Echogenicity within normal limits. No mass or hydronephrosis visualized. Bladder: Appears normal for degree of bladder distention. Other: None. IMPRESSION: 1. Stable renal ultrasound.  No acute findings. Electronically Signed   By: Randa Ngo M.D.   On: 04/23/2022 20:35    Lab Results: Recent Labs    04/23/22 1242 04/23/22 1251 04/24/22 0541 04/25/22 0523  WBC 8.6  --  8.1 7.1  HGB 10.2* 9.9* 8.8* 7.9*  HCT 31.4* 29.0* 28.0* 25.7*  PLT 270  --  221 205   BMET Recent Labs    04/24/22 0541 04/25/22 0523 04/25/22 0740  NA 148* 126* 142  K 3.9 2.5* 4.1  CL 116* 105 114*  CO2 24 17* 22  GLUCOSE 210* 112* 173*  BUN 25* 15 19  CREATININE 3.03* 2.01* 2.77*  CALCIUM 8.6* 5.9* 8.2*   LFT Recent Labs    04/24/22 0541  PROT 5.9*  ALBUMIN 2.8*  AST 10*  ALT 10  ALKPHOS 66  BILITOT 1.0   PT/INR No results for input(s): "LABPROT", "INR" in the last 72 hours.    Patient profile:   51 year old female with type 1 diabetes readmitted to hospital with chronic nausea and vomiting secondary to suspected gastroparesis and esophagitis EGD 04/02/2022 showed grade A reflux esophagitis without bleeding.   Head CT negative 04/08/2022.   CTA 04/21/2022 without acute intra-abdominal/pelvic pathology.   Normal gastric emptying study 04/01/2019:   Impression/Plan:   Chronic nausea and vomiting -Continue Reglan 10 mg IV every 6 -Continue Phenergan 12.5 mg IV every 6 -Lorazepam 1 mg  IV every 6 and  Scopolamine patch  -Continue Pantoprazole 40 mg twice daily -No plans for repeat EGD at this time -Eventual outpatient gastric emptying study when off antiemetic therapy -Await further recommendations per Dr. Rush Landmark  Chronic anemia secondary to CKD  HGB 7.9  (8.8) (9.9) MCV 90.8 Anemia studies on 04/08/2022  Iron 32 Ferritin 108 B12 371 No overt GI bleeding, no bloody emesis. Monitor closely, transfuse if hemoglobin below 7. If any  over bleeding please call GI  AKI on CKD stage III   S/P cardiac arrest s/p AICD placement    Chronic systolic CHF. LVEF 40 - 45%.      LOS: 1 day   [name]  04/25/2022, 10:18 AM

## 2022-04-26 ENCOUNTER — Inpatient Hospital Stay (HOSPITAL_COMMUNITY): Payer: BC Managed Care – PPO

## 2022-04-26 DIAGNOSIS — R1114 Bilious vomiting: Secondary | ICD-10-CM | POA: Diagnosis not present

## 2022-04-26 DIAGNOSIS — Z8719 Personal history of other diseases of the digestive system: Secondary | ICD-10-CM | POA: Diagnosis not present

## 2022-04-26 DIAGNOSIS — K920 Hematemesis: Secondary | ICD-10-CM | POA: Diagnosis not present

## 2022-04-26 DIAGNOSIS — R1110 Vomiting, unspecified: Secondary | ICD-10-CM

## 2022-04-26 DIAGNOSIS — D649 Anemia, unspecified: Secondary | ICD-10-CM

## 2022-04-26 LAB — HEPATIC FUNCTION PANEL
ALT: 9 U/L (ref 0–44)
AST: 12 U/L — ABNORMAL LOW (ref 15–41)
Albumin: 2.9 g/dL — ABNORMAL LOW (ref 3.5–5.0)
Alkaline Phosphatase: 71 U/L (ref 38–126)
Bilirubin, Direct: 0.1 mg/dL (ref 0.0–0.2)
Indirect Bilirubin: 0.8 mg/dL (ref 0.3–0.9)
Total Bilirubin: 0.9 mg/dL (ref 0.3–1.2)
Total Protein: 6.2 g/dL — ABNORMAL LOW (ref 6.5–8.1)

## 2022-04-26 LAB — CBC
HCT: 30.7 % — ABNORMAL LOW (ref 36.0–46.0)
Hemoglobin: 9.8 g/dL — ABNORMAL LOW (ref 12.0–15.0)
MCH: 28.3 pg (ref 26.0–34.0)
MCHC: 31.9 g/dL (ref 30.0–36.0)
MCV: 88.7 fL (ref 80.0–100.0)
Platelets: 200 10*3/uL (ref 150–400)
RBC: 3.46 MIL/uL — ABNORMAL LOW (ref 3.87–5.11)
RDW: 14 % (ref 11.5–15.5)
WBC: 8.4 10*3/uL (ref 4.0–10.5)
nRBC: 0 % (ref 0.0–0.2)

## 2022-04-26 LAB — GLUCOSE, CAPILLARY
Glucose-Capillary: 106 mg/dL — ABNORMAL HIGH (ref 70–99)
Glucose-Capillary: 115 mg/dL — ABNORMAL HIGH (ref 70–99)
Glucose-Capillary: 121 mg/dL — ABNORMAL HIGH (ref 70–99)
Glucose-Capillary: 129 mg/dL — ABNORMAL HIGH (ref 70–99)
Glucose-Capillary: 75 mg/dL (ref 70–99)
Glucose-Capillary: 94 mg/dL (ref 70–99)

## 2022-04-26 LAB — BASIC METABOLIC PANEL
Anion gap: 6 (ref 5–15)
BUN: 17 mg/dL (ref 6–20)
CO2: 22 mmol/L (ref 22–32)
Calcium: 9.1 mg/dL (ref 8.9–10.3)
Chloride: 113 mmol/L — ABNORMAL HIGH (ref 98–111)
Creatinine, Ser: 2.59 mg/dL — ABNORMAL HIGH (ref 0.44–1.00)
GFR, Estimated: 22 mL/min — ABNORMAL LOW (ref >60)
Glucose, Bld: 135 mg/dL — ABNORMAL HIGH (ref 70–99)
Potassium: 3.8 mmol/L (ref 3.5–5.1)
Sodium: 141 mmol/L (ref 135–145)

## 2022-04-26 MED ORDER — LORAZEPAM 2 MG/ML IJ SOLN
1.0000 mg | Freq: Three times a day (TID) | INTRAMUSCULAR | Status: DC | PRN
  Administered 2022-04-26 (×2): 1 mg via INTRAVENOUS
  Filled 2022-04-26 (×2): qty 1

## 2022-04-26 MED ORDER — PROCHLORPERAZINE EDISYLATE 10 MG/2ML IJ SOLN
10.0000 mg | Freq: Four times a day (QID) | INTRAMUSCULAR | Status: DC | PRN
  Administered 2022-04-26: 10 mg via INTRAVENOUS
  Filled 2022-04-26: qty 2

## 2022-04-26 MED ORDER — PROCHLORPERAZINE EDISYLATE 10 MG/2ML IJ SOLN
10.0000 mg | Freq: Four times a day (QID) | INTRAMUSCULAR | Status: DC
  Administered 2022-04-26 – 2022-04-28 (×8): 10 mg via INTRAVENOUS
  Filled 2022-04-26 (×8): qty 2

## 2022-04-26 MED ORDER — SODIUM CHLORIDE 0.9 % IV SOLN: 12.5000 mg | Freq: Four times a day (QID) | INTRAVENOUS | Status: DC | PRN

## 2022-04-26 MED ORDER — LACTATED RINGERS IV SOLN: INTRAVENOUS | Status: DC

## 2022-04-26 NOTE — Progress Notes (Signed)
  Transition of Care [facility]) Screening Note   Patient Details  Name: [name] Date of Birth: 1971/07/19   Transition of Care [facility]) CM/SW Contact:    Vassie Moselle, LCSW Phone Number: 04/26/2022, 10:53 AM    Transition of Care Department [facility]) has reviewed patient and no TOC needs have been identified at this time. We will continue to monitor patient advancement through interdisciplinary progression rounds. If new patient transition needs arise, please place a TOC consult.

## 2022-04-26 NOTE — Progress Notes (Addendum)
Progress Note  Primary GI: Dr. Loletha Carrow   Subjective  Chief Complaint: Intractable nausea and vomiting   Patient lying in bed stating she had a difficult night.   Continued having nausea and vomiting, gets full quickly, has foamy white vomit, no bloody emesis., had spaced out Phenergan and Reglan without relief. Added on Compazine to take as needed for rescue medication. Patient with bowel movement last night, no relief of symptoms. No melena, no hematochezia Patient states she is cold but denies fever.  Denies any history of rashes or mouth sores. Denies joint pain other than lower back pain. States she is constantly clearing her throat. No shortness of breath.  No dysphagia or odynophagia. Patient has lost a total of 6 pounds   Objective   Vital signs in last 24 hours: Temp:  [98.6 F (37 C)-99.5 F (37.5 C)] 98.8 F (37.1 C) (09/22 0316) Pulse Rate:  [91-97] 97 (09/22 0316) Resp:  [16-24] 16 (09/22 0316) BP: (152-189)/(85-116) 177/101 (09/22 0316) SpO2:  [98 %-100 %] 98 % (09/22 0316) Last BM Date : 04/25/22 Last BM recorded by nurses in past 5 days Stool Type: Type 4 (Like a smooth, soft sausage or snake) (04/25/2022  8:00 PM)  General:   female in no acute distress, appears fatigued Heart:  Regular rate and rhythm; no murmurs Pulm: Clear anteriorly; no wheezing Abdomen:  Soft, Non-distended AB, Active bowel sounds. mild tenderness in the entire abdomen. Without guarding and Without rebound, No organomegaly appreciated. Extremities:  without  edema. Neurologic:  Alert and  oriented x4;  No focal deficits.  Psych:  Cooperative. Normal mood and affect.  Intake/Output from previous day: 09/21 0701 - 09/22 0700 In: 2262.5 [I.V.:1547.1; IV Piggyback:715.4] Out: 300 [Urine:300] Intake/Output this shift: No intake/output data recorded.  Studies/Results: No results found.  Lab Results: Recent Labs    04/24/22 0541 04/25/22 0523 04/26/22 0535  WBC 8.1 7.1 8.4  HGB  8.8* 7.9* 9.8*  HCT 28.0* 25.7* 30.7*  PLT 221 205 200   BMET Recent Labs    04/25/22 0523 04/25/22 0740 04/26/22 0535  NA 126* 142 141  K 2.5* 4.1 3.8  CL 105 114* 113*  CO2 17* 22 22  GLUCOSE 112* 173* 135*  BUN $Re'15 19 17  'BaT$ CREATININE 2.01* 2.77* 2.59*  CALCIUM 5.9* 8.2* 9.1   LFT Recent Labs    04/24/22 0541  PROT 5.9*  ALBUMIN 2.8*  AST 10*  ALT 10  ALKPHOS 66  BILITOT 1.0   PT/INR No results for input(s): "LABPROT", "INR" in the last 72 hours.    Patient profile:   51 year old female with type 1 diabetes readmitted to hospital with chronic nausea and vomiting secondary to suspected gastroparesis and esophagitis EGD 04/02/2022 showed grade A reflux esophagitis without bleeding.   Head CT negative 04/08/2022.   CTA 04/21/2022 without acute intra-abdominal/pelvic pathology.   Normal gastric emptying study 04/01/2019:   Impression/Plan:   Chronic nausea and vomiting -Continue Reglan 10 mg IV every 6 -Added on Compazine a 6 hours scheduled and phenergan PRN continue MiraLAX and Dulcolax as needed. - Scopolamine patch  -Continue Pantoprazole 40 mg twice daily -Discussed potentially trying Ativan at night for sleep and nausea, patient responded best to this per nurse? Anxiety component? Amitriptyline 10 mg? -No plans for repeat EGD at this time unless patient has overt signs of GI bleeding -Eventual outpatient gastric emptying study when off antiemetic therapy - will order KUB, normal CTA AB 09/17 -Await further recommendations per  Dr. Rush Landmark  Chronic anemia secondary to CKD CBC on 04/26/2022   HGB 9.8 (7.9) (8.8) (9.9) Anemia studies on 04/08/2022  Iron 32 Ferritin 108 B12 371 No overt GI bleeding, no bloody emesis. Monitor closely, transfuse if hemoglobin below 7. If any over bleeding please call GI  AKI on CKD stage III   S/P cardiac arrest s/p AICD placement    Chronic systolic CHF. LVEF 40 - 45%.      LOS: 2 days   Vladimir Crofts  04/26/2022,  9:35 AM

## 2022-04-26 NOTE — Progress Notes (Addendum)
PROGRESS NOTE   [name]  EUM:[id number]    DOB: 1971-05-23    DOA: 04/23/2022  PCP: [name], PA   I have briefly reviewed patients previous medical records in [facility].  Chief Complaint  Patient presents with   Emesis    Brief Narrative:  51 year old female with medical history significant for type 1 DM with insulin pump, complicated by diabetic neuropathy and gastroparesis, CKD 4, chronic diastolic CHF, HTN, asthma, anxiety and depression, recent hospitalization 04/05/2022 - 04/11/2022 for syncope, cardiac arrest that required on field AED shock, repeat echo showed EF dropped to 40-45%, seen by cardiology and underwent AICD placement 04/09/2022, prior to that admitted 8/28 - 8/30 for hematemesis and EGD revealed grade a esophagitis with active bleeding, presented to the ED on 04/23/2022 with complaints of nausea and vomiting for past 5 days, constant, persistent and had been seen in the ED for same symptoms 2 days prior.  Unable to keep p.o. down including Phenergan.  Admitted for intractable nausea and vomiting, light clear flare of diabetic gastroparesis, hematemesis and acute on chronic kidney disease.  [facility] consulting and assisting with care.  Nausea and vomiting and unable to keep any oral intake down including meds.   Assessment & Plan:  Principal Problem:   Intractable nausea and vomiting Active Problems:   Diabetic gastroparesis (HCC)   Coffee ground emesis   AKI (acute kidney injury) ([place])   Stage 3b chronic kidney disease (CKD) (HCC)   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)   Essential hypertension   Chronic diastolic CHF (congestive heart failure) (HCC)   Acute esophagitis   Anemia   History of esophagitis   Intractable nausea and vomiting, suspecting diabetic gastroparesis/esophagitis flare: [facility] consulted and assisting with care, appreciated.  EGD 8/29: Grade a reflux esophagitis without bleeding.  GES 04/01/2019: Normal.   Continue PPI IV twice daily until able to tolerate orally, scopolamine patch every 72 hours, scheduled IV Reglan 10 mg every 6 hours.  Despite these measures, change in her symptoms.  Unable to keep anything down including meds.  GI has now changed her to scheduled IV Compazine, changed Phenergan to IV as needed and added as needed IV Ativan which helped her 2 days ago.  Remains on clear liquids.  As per GI, no plans for repeat EGD.  Eventual outpatient GES when off antiemetic therapy.  Minimize narcotics.  Continue gentle IVF.  Negative head CT 04/08/22.  EKG 9/22 personally reviewed: QTc 454 ms.  KUB negative.  Hematemesis Concern for hematemesis again with the coffee ground emesis report, HGB 10 on admission is actually better than DC earlier this month. Had hematemesis from esophagitis during admit earlier this month / end of last month.  Continue twice daily PPI.  Although hemoglobin had dropped, up to 9.8 today, stable.  GI input as noted above.  No further hematemesis.  Follow CBC daily and transfuse if hemoglobin 7 g or less.   Acute kidney injury complicating stage 3b chronic kidney disease (CKD) (HCC) Profound worsening in kidney function over course of this past year it looks like, not actually clear how bad her baseline is given recent hospital stays.  Creatinine has gone up from 2.78-3.03.  Continue IV fluids, avoid nephrotoxic's and follow BMP daily.  Renal ultrasound without acute findings.  AKI may be multifactorial, prerenal due to nausea/vomiting or even due to ATN but cannot be sure.  Erroneous BMP this morning which was repeated.  Creatinine down  to 2.77 >2.59.    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  Reasonable inpatient control.  Adjust insulins as needed.  No DKA currently.   Essential hypertension Resumed prior p.o. meds including BiDil and metoprolol.  However unable to tolerate her  p.o. meds.  Continue as needed IV hydralazine and started scheduled IV metoprolol 5 Mg every 6 hours.  No BP recordings since 3 AM.  Monitor closely.   Chronic systolic CHF (congestive heart failure) (HCC)/s/p cardiac arrest/s/p AICD placement Hold diuretics in setting of AKI and likely dehydration from N/V. Resumed BiDil and metoprolol.  Recent cardiac arrest, improved LVEF, 50-55% , low normal by TTE 9/3. AICD placement history as noted above.  Clinically euvolemic.  Hypernatremia: May be related to GI losses and IVF hydration.  Changed to half-normal saline.  Resolved.  Continue LR infusion.  Body mass index is 23.59 kg/m.    DVT prophylaxis: Place and maintain sequential compression device Start: 04/23/22 2024     Code Status: Full Code:  Family Communication: Discussed in detail with patient's significant other via phone.  Updated care and answered all questions.  He expressed frustration that patient does not getting better.  Advised him that this can be a difficult condition to treat and may take an undetermined amount of time.  He was appreciative of the call. Disposition:  Status is: Inpatient.     Consultants:   [facility]  Procedures:     Antimicrobials:      Subjective:  Had BM last night.  Normal colored stools.  Ongoing intractable nausea, vomiting medications not helping, unable to even keep meds down.  No abdominal pain.  Unable to sleep much last night due to nausea and vomiting.  Objective:   Vitals:   04/25/22 1831 04/25/22 1927 04/25/22 1935 04/26/22 0316  BP: (!) 182/108 (!) 158/85 (!) 152/89 (!) 177/101  Pulse:  91  97  Resp:  18  16  Temp:  98.6 F (37 C)  98.8 F (37.1 C)  TempSrc:  Oral  Oral  SpO2:  99%  98%  Weight:      Height:        General exam: Young female, moderately built and nourished lying comfortably propped up in bed without distress. Respiratory system: Clear to auscultation.  No increased work of breathing. Cardiovascular  system: S1 & S2 heard, RRR. No JVD, murmurs, rubs, gallops or clicks. No pedal edema.   Gastrointestinal system: Abdomen is nondistended, soft and nontender.  No organomegaly or masses appreciated.  Normal bowel sounds heard. Central nervous system: Alert and oriented this morning. No focal neurological deficits. Extremities: Symmetric 5 x 5 power. Skin: No rashes, lesions or ulcers Psychiatry: Judgement and insight appear normal. Mood & affect appropriate.     Data Reviewed:   I have personally reviewed following labs and imaging studies   CBC: Recent Labs  Lab 04/23/22 1242 04/23/22 1251 04/24/22 0541 04/25/22 0523 04/26/22 0535  WBC 8.6  --  8.1 7.1 8.4  NEUTROABS 7.4  --   --   --   --   HGB 10.2*   < > 8.8* 7.9* 9.8*  HCT 31.4*   < > 28.0* 25.7* 30.7*  MCV 84.4  --  88.6 90.8 88.7  PLT 270  --  221 205 200   < > = values in this interval not displayed.    Basic Metabolic Panel:  Recent Labs  Lab 04/21/22 0941 04/23/22 1242 04/23/22 1251 04/24/22 0541 04/25/22 0523 04/25/22 0740 04/26/22 0535  NA 143 143 143 148* 126* 142 141  K 4.5 3.8 3.8 3.9 2.5* 4.1 3.8  CL 110 107  --  116* 105 114* 113*  CO2 23 24  --  24 17* 22 22  GLUCOSE 132* 310*  --  210* 112* 173* 135*  BUN 32* 23*  --  25* $Re'15 19 17  'ccF$ CREATININE 2.22* 2.78*  --  3.03* 2.01* 2.77* 2.59*  CALCIUM 8.7* 9.4  --  8.6* 5.9* 8.2* 9.1  MG 1.7  --   --   --   --  1.8  --     Liver Function Tests: Recent Labs  Lab 04/21/22 0941 04/23/22 1242 04/24/22 0541  AST 11* 10* 10*  ALT $Re'11 8 10  'RbJ$ ALKPHOS 71 80 66  BILITOT 0.5 0.9 1.0  PROT 6.7 7.2 5.9*  ALBUMIN 3.6 3.8 2.8*    CBG: Recent Labs  Lab 04/26/22 0311 04/26/22 0729 04/26/22 1133  GLUCAP 129* 106* 121*    Microbiology Studies:   Recent Results (from the past 240 hour(s))  Resp Panel by RT-PCR (Flu A&B, Covid) Anterior Nasal Swab     Status: None   Collection Time: 04/21/22 10:03 AM   Specimen: Anterior Nasal Swab  Result Value Ref  Range Status   SARS Coronavirus 2 by RT PCR NEGATIVE NEGATIVE Final    Comment: (NOTE) SARS-CoV-2 target nucleic acids are NOT DETECTED.  The SARS-CoV-2 RNA is generally detectable in upper respiratory specimens during the acute phase of infection. The lowest concentration of SARS-CoV-2 viral copies this assay can detect is 138 copies/mL. A negative result does not preclude SARS-Cov-2 infection and should not be used as the sole basis for treatment or other patient management decisions. A negative result may occur with  improper specimen collection/handling, submission of specimen other than nasopharyngeal swab, presence of viral mutation(s) within the areas targeted by this assay, and inadequate number of viral copies(<138 copies/mL). A negative result must be combined with clinical observations, patient history, and epidemiological information. The expected result is Negative.  Fact Sheet for Patients:  EntrepreneurPulse.com.au  Fact Sheet for Healthcare Providers:  IncredibleEmployment.be  This test is no t yet approved or cleared by the [place] FDA and  has been authorized for detection and/or diagnosis of SARS-CoV-2 by FDA under an Emergency Use Authorization (EUA). This EUA will remain  in effect (meaning this test can be used) for the duration of the COVID-19 declaration under Section 564(b)(1) of the Act, 21 U.S.C.section 360bbb-3(b)(1), unless the authorization is terminated  or revoked sooner.       Influenza A by PCR NEGATIVE NEGATIVE Final   Influenza B by PCR NEGATIVE NEGATIVE Final    Comment: (NOTE) The Xpert Xpress SARS-CoV-2/FLU/RSV plus assay is intended as an aid in the diagnosis of influenza from Nasopharyngeal swab specimens and should not be used as a sole basis for treatment. Nasal washings and aspirates are unacceptable for Xpert Xpress SARS-CoV-2/FLU/RSV testing.  Fact Sheet for  Patients: EntrepreneurPulse.com.au  Fact Sheet for Healthcare Providers: IncredibleEmployment.be  This test is not yet approved or cleared by the [place] FDA and has been authorized for detection and/or diagnosis of SARS-CoV-2 by FDA under an Emergency Use Authorization (EUA). This EUA will remain in effect (meaning this test can be used) for the duration of the COVID-19 declaration under Section 564(b)(1) of the Act, 21 U.S.C. section 360bbb-3(b)(1),  unless the authorization is terminated or revoked.  Performed at [facility], [address] [place], [place] [address]     Radiology Studies:  No results found.  Scheduled Meds:    insulin aspart  0-9 Units Subcutaneous Q4H   insulin glargine-yfgn  12 Units Subcutaneous Daily   isosorbide-hydrALAZINE  1 tablet Oral TID   metoCLOPramide (REGLAN) injection  10 mg Intravenous Q6H   metoprolol tartrate  5 mg Intravenous Q6H   pantoprazole (PROTONIX) IV  40 mg Intravenous Q12H   rosuvastatin  20 mg Oral Daily   scopolamine  1 patch Transdermal Q72H    Continuous Infusions:    lactated ringers Stopped (04/25/22 2116)   promethazine (PHENERGAN) injection (IM or IVPB) 12.5 mg (04/26/22 1305)     LOS: 2 days     Vernell Leep, MD,  FACP, [facility], [facility], [facility] (Care Management Physician Certified) [place]  To contact the attending provider between 7A-7P or the covering provider during after hours 7P-7A, please log into the web site [email or web address] and access using universal [place] password for that web site. If you do not have the password, please call the hospital operator.  04/26/2022, 1:19 PM

## 2022-04-27 DIAGNOSIS — R1114 Bilious vomiting: Secondary | ICD-10-CM

## 2022-04-27 DIAGNOSIS — R112 Nausea with vomiting, unspecified: Secondary | ICD-10-CM | POA: Diagnosis not present

## 2022-04-27 DIAGNOSIS — N179 Acute kidney failure, unspecified: Secondary | ICD-10-CM | POA: Diagnosis not present

## 2022-04-27 DIAGNOSIS — E1143 Type 2 diabetes mellitus with diabetic autonomic (poly)neuropathy: Secondary | ICD-10-CM | POA: Diagnosis not present

## 2022-04-27 DIAGNOSIS — K209 Esophagitis, unspecified without bleeding: Secondary | ICD-10-CM

## 2022-04-27 DIAGNOSIS — R1013 Epigastric pain: Secondary | ICD-10-CM

## 2022-04-27 DIAGNOSIS — K920 Hematemesis: Secondary | ICD-10-CM | POA: Diagnosis not present

## 2022-04-27 LAB — CBC
HCT: 27.2 % — ABNORMAL LOW (ref 36.0–46.0)
Hemoglobin: 8.4 g/dL — ABNORMAL LOW (ref 12.0–15.0)
MCH: 27.8 pg (ref 26.0–34.0)
MCHC: 30.9 g/dL (ref 30.0–36.0)
MCV: 90.1 fL (ref 80.0–100.0)
Platelets: 182 10*3/uL (ref 150–400)
RBC: 3.02 MIL/uL — ABNORMAL LOW (ref 3.87–5.11)
RDW: 13.8 % (ref 11.5–15.5)
WBC: 6 10*3/uL (ref 4.0–10.5)
nRBC: 0 % (ref 0.0–0.2)

## 2022-04-27 LAB — GLUCOSE, CAPILLARY
Glucose-Capillary: 113 mg/dL — ABNORMAL HIGH (ref 70–99)
Glucose-Capillary: 86 mg/dL (ref 70–99)
Glucose-Capillary: 105 mg/dL — ABNORMAL HIGH (ref 70–99)
Glucose-Capillary: 141 mg/dL — ABNORMAL HIGH (ref 70–99)
Glucose-Capillary: 136 mg/dL — ABNORMAL HIGH (ref 70–99)

## 2022-04-27 LAB — BASIC METABOLIC PANEL
Anion gap: 6 (ref 5–15)
BUN: 17 mg/dL (ref 6–20)
CO2: 22 mmol/L (ref 22–32)
Calcium: 8.3 mg/dL — ABNORMAL LOW (ref 8.9–10.3)
Chloride: 114 mmol/L — ABNORMAL HIGH (ref 98–111)
Creatinine, Ser: 2.57 mg/dL — ABNORMAL HIGH (ref 0.44–1.00)
GFR, Estimated: 22 mL/min — ABNORMAL LOW (ref >60)
Glucose, Bld: 111 mg/dL — ABNORMAL HIGH (ref 70–99)
Potassium: 3.7 mmol/L (ref 3.5–5.1)
Sodium: 142 mmol/L (ref 135–145)

## 2022-04-27 LAB — SEDIMENTATION RATE: Sed Rate: 40 mm/hr — ABNORMAL HIGH (ref 0–22)

## 2022-04-27 LAB — C-REACTIVE PROTEIN: CRP: <0.5 mg/dL (ref <1.0)

## 2022-04-27 MED ORDER — METOPROLOL SUCCINATE ER 25 MG PO TB24
25.0000 mg | ORAL_TABLET | Freq: Every day | ORAL | Status: DC
  Administered 2022-04-27 – 2022-04-29 (×3): 25 mg via ORAL
  Filled 2022-04-27 (×3): qty 1

## 2022-04-27 MED ORDER — ISOSORB DINITRATE-HYDRALAZINE 20-37.5 MG PO TABS
1.0000 | ORAL_TABLET | Freq: Three times a day (TID) | ORAL | Status: DC
  Administered 2022-04-27 – 2022-04-29 (×6): 1 via ORAL
  Filled 2022-04-27 (×7): qty 1

## 2022-04-27 NOTE — Progress Notes (Signed)
Gastroenterology Inpatient Follow-up Note   PATIENT IDENTIFICATION  [name] is a 51 y.o. female with a pmh significant for CHF, IDDM, CRI, esophagitis, possible gastroparesis (years ago normal emptying study pending updated).  The patient was admitted with recurrent nausea/vomiting and pain with episodes of coffee-ground emesis. Hospital Day: 5  SUBJECTIVE  The patient's chart has been reviewed. The patient's labs have been reviewed.  Her anemia is stable.  Her LFTs from yesterday show no evidence of significant issues.  Her inflammatory markers are pending. Her EKG from yesterday was reviewed with a QTc just above 450.  EKG from today has not been completed or uploaded as of yet. Within the last 24 hours, she received all of her scheduled Reglan and Compazine.  She received 2 doses of IV Ativan.  No doses of Phenergan were needed. Today, the patient is doing better.  She feels like she wants to try to advance her diet.  She has less abdominal pain and discomfort.  She denies fevers or chills.  Overall vomiting has decreased.   OBJECTIVE  Scheduled Inpatient Medications:   insulin aspart  0-9 Units Subcutaneous Q4H   insulin glargine-yfgn  12 Units Subcutaneous Daily   metoCLOPramide (REGLAN) injection  10 mg Intravenous Q6H   metoprolol tartrate  5 mg Intravenous Q6H   pantoprazole (PROTONIX) IV  40 mg Intravenous Q12H   prochlorperazine  10 mg Intravenous Q6H   rosuvastatin  20 mg Oral Daily   scopolamine  1 patch Transdermal Q72H   Continuous Inpatient Infusions:   lactated ringers 75 mL/hr at 04/26/22 2356   promethazine (PHENERGAN) injection (IM or IVPB)     PRN Inpatient Medications: acetaminophen **OR** acetaminophen, bisacodyl, hydrALAZINE, LORazepam, mouth rinse, polyethylene glycol, polyvinyl alcohol, promethazine (PHENERGAN) injection (IM or IVPB)   Physical Examination  Temp:  [98.7 F (37.1 C)] 98.7 F (37.1 C) (09/23 0357) Pulse Rate:  [82-93] 82 (09/23  0357) Resp:  [16-18] 18 (09/23 0357) BP: (149-163)/(78-91) 149/78 (09/23 0357) SpO2:  [94 %-97 %] 94 % (09/23 0357) Temp (24hrs), Avg:98.7 F (37.1 C), Min:98.7 F (37.1 C), Max:98.7 F (37.1 C)  Weight: 78.9 kg GEN: NAD, appears older than stated age, appears chronically ill but is nontoxic PSYCH: Cooperative, without pressured speech EYE: Conjunctivae pink, sclerae anicteric ENT: MMM CV: Nontachycardic RESP: No audible wheezing GI: NABS, soft, protuberant abdomen, nondistended,  MSK/EXT: Bilateral pedal edema present SKIN: No jaundice NEURO:  Alert & Oriented x 3, no focal deficits   Review of Data   Laboratory Studies   Recent Labs  Lab 04/25/22 0740 04/26/22 0535 04/27/22 0533  NA 142   < > 142  K 4.1   < > 3.7  CL 114*   < > 114*  CO2 22   < > 22  BUN 19   < > 17  CREATININE 2.77*   < > 2.57*  GLUCOSE 173*   < > 111*  CALCIUM 8.2*   < > 8.3*  MG 1.8  --   --    < > = values in this interval not displayed.   Recent Labs  Lab 04/26/22 0535  AST 12*  ALT 9  ALKPHOS 71    Recent Labs  Lab 04/25/22 0523 04/26/22 0535 04/27/22 0533  WBC 7.1 8.4 6.0  HGB 7.9* 9.8* 8.4*  HCT 25.7* 30.7* 27.2*  PLT 205 200 182   Recent Labs  Lab 04/21/22 1057  INR 0.9   Imaging Studies  DG Abd 1 View  Result Date: 04/26/2022 CLINICAL DATA:  Nausea, vomiting. EXAM: ABDOMEN - 1 VIEW COMPARISON:  None Available. FINDINGS: The bowel gas pattern is normal. No radio-opaque calculi or other significant radiographic abnormality are seen. IMPRESSION: Negative. Electronically Signed   By: Marijo Conception M.D.   On: 04/26/2022 13:25    GI Procedures and Studies  No new studies to review   ASSESSMENT  [name] is a 51 y.o. female with a pmh significant for CHF, IDDM, CRI, esophagitis, possible gastroparesis (years ago normal emptying study pending updated).  The patient was admitted with recurrent nausea/vomiting and pain with episodes of coffee-ground emesis.  The  patient is hemodynamically stable.  Clinically she is better from yesterday into today.  I agree with the continuation of the diet advancement.  Hopefully she will be able to continue to advance her diet over the course of today and tomorrow.  I do not want to make any major medication changes today but if she is doing well tomorrow we can begin transitioning her IV medications to oral medications in an effort of trying to get her out of the hospital.  LFTs were rechecked yesterday are normal and so we will hold off on HIDA scanning.  We will see how she is doing tomorrow.   PLAN/RECOMMENDATIONS  IV Reglan 10 mg every 6 hours IV Compazine 10 mg every 6 hours staggered IV Ativan 1 mg every 12 hours Scopolamine every 72 hours IV Phenergan 12.5 mg as needed failure of above Daily EKG so QTc can be monitored Daily potassium/magnesium to be monitored Advance diet as tolerated Tomorrow's plan tentatively - Begin transitioning either Reglan or Compazine to p.o.   Please page/call with questions or concerns.   Justice Britain, MD [facility] Office # [phone]    LOS: 3 days  Irving Copas  04/27/2022, 8:10 AM

## 2022-04-27 NOTE — Progress Notes (Addendum)
PROGRESS NOTE   [name]  GYI:[id number]    DOB: April 30, 1971    DOA: 04/23/2022  PCP: [name], PA   I have briefly reviewed patients previous medical records in [facility].  Chief Complaint  Patient presents with   Emesis    Brief Narrative:  52 year old female with medical history significant for type 1 DM with insulin pump, complicated by diabetic neuropathy and gastroparesis, CKD 4, chronic diastolic CHF, HTN, asthma, anxiety and depression, recent hospitalization 04/05/2022 - 04/11/2022 for syncope, cardiac arrest that required on field AED shock, repeat echo showed EF dropped to 40-45%, seen by cardiology and underwent AICD placement 04/09/2022, prior to that admitted 8/28 - 8/30 for hematemesis and EGD revealed grade a esophagitis with active bleeding, presented to the ED on 04/23/2022 with complaints of nausea and vomiting for past 5 days, constant, persistent and had been seen in the ED for same symptoms 2 days prior.  Unable to keep p.o. down including Phenergan.  Admitted for intractable nausea and vomiting, likely flare of diabetic gastroparesis, hematemesis and acute on chronic kidney disease.  [facility] consulting and assisting with care.  Nausea and vomiting and unable to keep any oral intake down including meds.  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  EGD 8/29: Grade a reflux esophagitis without  bleeding.  GES 04/01/2019: Normal.  Continue PPI IV twice daily until able to tolerate orally, scopolamine patch every 72 hours, scheduled IV Reglan 10 mg every 6 hours.  Despite these measures, change in her symptoms.  Unable to keep anything down including meds.  GI has now changed her to scheduled IV Compazine, changed Phenergan to IV as needed and added as needed IV Ativan which helped her 2 days ago.  Remains on clear liquids.  As per GI, no plans for repeat EGD.  Eventual outpatient GES when off antiemetic therapy.  Minimize narcotics.  Continue gentle IVF.  Negative head CT 04/08/22.  EKG 9/22 personally reviewed: QTc 454 ms.  KUB negative.  Finally over the last 24 hours with improvement in nausea/vomiting (one episode), tolerating clear liquids and actually asking for food.  Advancing to soft diet, reassess in a.m. to gradually cut back on antiemetics.  Hematemesis Concern for hematemesis again with the coffee ground emesis report, HGB 10 on admission is actually better than DC earlier this month. Had hematemesis from esophagitis during admit earlier this month / end of last month.  Continue twice daily PPI.  AGI input as noted above.  No further hematemesis.  Follow CBC daily and transfuse if hemoglobin 7 g or less.  Hemoglobin fluctuating widely but essentially stable since admission.   Acute kidney injury complicating stage 3b chronic kidney disease (CKD) (HCC) Profound worsening in kidney function over course of this past year it looks like, not actually clear how bad her baseline is given recent hospital stays.  Creatinine has gone up from 2.78-3.03.  Continue IV fluids, avoid nephrotoxic's  and follow BMP daily.  Renal ultrasound without acute findings.  AKI may be multifactorial, prerenal due to nausea/vomiting or even due to ATN but cannot be sure.  Creatinine has plateaued 0.5 range over the last 2 days.  Continue to trend daily BMP.    Type 1 diabetes mellitus with diabetic neuropathy  (HCC) Continue Lantus 12u Q24H, got first dose in ED, SSI and monitor CBGs closely.  Reasonable inpatient control.  Adjust insulins as needed.  No DKA currently.   Essential hypertension Resumed prior p.o. meds including BiDil and metoprolol.  However unable to tolerate her p.o. meds during which transitioned to scheduled IV metoprolol.  Now that she is tolerating p.o. meds, will resume prior home meds including BiDil and oral metoprolol.   Chronic systolic CHF (congestive heart failure) (HCC)/s/p cardiac arrest/s/p AICD placement Hold diuretics in setting of AKI and likely dehydration from N/V. Resumed BiDil and metoprolol.  Recent cardiac arrest, improved LVEF, 50-55% , low normal by TTE 9/3. AICD placement history as noted above.  Clinically euvolemic.  Hypernatremia: May be related to GI losses and IVF hydration.  Changed to half-normal saline.  Resolved.  DC IVF.  Body mass index is 23.59 kg/m.    DVT prophylaxis: Place and maintain sequential compression device Start: 04/23/22 2024     Code Status: Full Code:  Family Communication: None at bedside. Disposition:  Status is: Inpatient.  Slowly improving.  Hopeful DC home 9/25.     Consultants:   [facility]  Procedures:     Antimicrobials:      Subjective:  Feels much better.  Reports single episode of 24 hours.  Tolerated clear liquids this morning.  Asking for diet advancement.  No abdominal pain.  Having BMs and voiding.  Objective:   Vitals:   04/26/22 0316 04/26/22 1342 04/26/22 1939 04/27/22 0357  BP: (!) 177/101 (!) 159/91 (!) 163/82 (!) 149/78  Pulse: 97 92 93 82  Resp: $Remo'16 16 18 18  'kfHWq$ Temp: 98.8 F (37.1 C) 98.7 F (37.1 C) 98.7 F (37.1 C) 98.7 F (37.1 C)  TempSrc: Oral Oral Oral Oral  SpO2: 98% 97% 96% 94%  Weight:      Height:        General exam: Young female, moderately built and nourished lying comfortably propped up in bed without distress.  Looks improved compared to yesterday. Respiratory  system: Clear to auscultation.  No increased work of breathing. Cardiovascular system: S1 & S2 heard, RRR. No JVD, murmurs, rubs, gallops or clicks. No pedal edema.  Telemetry personally reviewed: Sinus rhythm. Gastrointestinal system: Abdomen nondistended, soft and nontender.  Normal bowel sounds heard. Central nervous system: Alert and oriented this morning. No focal neurological deficits. Extremities: Symmetric 5 x 5 power. Skin: No rashes, lesions or ulcers Psychiatry: Judgement and insight appear normal. Mood & affect appropriate.     Data Reviewed:   I have personally reviewed following labs and imaging studies   CBC: Recent Labs  Lab 04/23/22 1242 04/23/22 1251 04/25/22 0523 04/26/22 0535 04/27/22 0533  WBC 8.6   < > 7.1 8.4 6.0  NEUTROABS 7.4  --   --   --   --   HGB 10.2*   < > 7.9* 9.8* 8.4*  HCT 31.4*   < > 25.7* 30.7* 27.2*  MCV 84.4   < > 90.8 88.7 90.1  PLT 270   < > 205 200 182   < > = values in this interval not displayed.    Basic  Metabolic Panel: Recent Labs  Lab 04/21/22 0941 04/23/22 1242 04/24/22 0541 04/25/22 0523 04/25/22 0740 04/26/22 0535 04/27/22 0533  NA 143   < > 148* 126* 142 141 142  K 4.5   < > 3.9 2.5* 4.1 3.8 3.7  CL 110   < > 116* 105 114* 113* 114*  CO2 23   < > 24 17* $Remo'22 22 22  'UqgcU$ GLUCOSE 132*   < > 210* 112* 173* 135* 111*  BUN 32*   < > 25* $Rem'15 19 17 17  'nEkS$ CREATININE 2.22*   < > 3.03* 2.01* 2.77* 2.59* 2.57*  CALCIUM 8.7*   < > 8.6* 5.9* 8.2* 9.1 8.3*  MG 1.7  --   --   --  1.8  --   --    < > = values in this interval not displayed.    Liver Function Tests: Recent Labs  Lab 04/21/22 0941 04/23/22 1242 04/24/22 0541 04/26/22 0535  AST 11* 10* 10* 12*  ALT $Re'11 8 10 9  'Qjt$ ALKPHOS 71 80 66 71  BILITOT 0.5 0.9 1.0 0.9  PROT 6.7 7.2 5.9* 6.2*  ALBUMIN 3.6 3.8 2.8* 2.9*    CBG: Recent Labs  Lab 04/27/22 0354 04/27/22 0733 04/27/22 1200  GLUCAP 113* 86 105*    Microbiology Studies:   Recent Results (from the past 240  hour(s))  Resp Panel by RT-PCR (Flu A&B, Covid) Anterior Nasal Swab     Status: None   Collection Time: 04/21/22 10:03 AM   Specimen: Anterior Nasal Swab  Result Value Ref Range Status   SARS Coronavirus 2 by RT PCR NEGATIVE NEGATIVE Final    Comment: (NOTE) SARS-CoV-2 target nucleic acids are NOT DETECTED.  The SARS-CoV-2 RNA is generally detectable in upper respiratory specimens during the acute phase of infection. The lowest concentration of SARS-CoV-2 viral copies this assay can detect is 138 copies/mL. A negative result does not preclude SARS-Cov-2 infection and should not be used as the sole basis for treatment or other patient management decisions. A negative result may occur with  improper specimen collection/handling, submission of specimen other than nasopharyngeal swab, presence of viral mutation(s) within the areas targeted by this assay, and inadequate number of viral copies(<138 copies/mL). A negative result must be combined with clinical observations, patient history, and epidemiological information. The expected result is Negative.  Fact Sheet for Patients:  EntrepreneurPulse.com.au  Fact Sheet for Healthcare Providers:  IncredibleEmployment.be  This test is no t yet approved or cleared by the [place] FDA and  has been authorized for detection and/or diagnosis of SARS-CoV-2 by FDA under an Emergency Use Authorization (EUA). This EUA will remain  in effect (meaning this test can be used) for the duration of the COVID-19 declaration under Section 564(b)(1) of the Act, 21 U.S.C.section 360bbb-3(b)(1), unless the authorization is terminated  or revoked sooner.       Influenza A by PCR NEGATIVE NEGATIVE Final   Influenza B by PCR NEGATIVE NEGATIVE Final    Comment: (NOTE) The Xpert Xpress SARS-CoV-2/FLU/RSV plus assay is intended as an aid in the diagnosis of influenza from Nasopharyngeal swab specimens and should not be  used as a sole basis for treatment. Nasal washings and aspirates are unacceptable for Xpert Xpress SARS-CoV-2/FLU/RSV testing.  Fact Sheet for Patients: EntrepreneurPulse.com.au  Fact Sheet for Healthcare Providers: IncredibleEmployment.be  This test is not yet approved or cleared by the [place] FDA and has been authorized for detection and/or diagnosis of SARS-CoV-2 by FDA under an  Emergency Use Authorization (EUA). This EUA will remain in effect (meaning this test can be used) for the duration of the COVID-19 declaration under Section 564(b)(1) of the Act, 21 U.S.C. section 360bbb-3(b)(1), unless the authorization is terminated or revoked.  Performed at [facility], [address], [place], [place] [address]     Radiology Studies:  DG Abd 1 View  Result Date: 04/26/2022 CLINICAL DATA:  Nausea, vomiting. EXAM: ABDOMEN - 1 VIEW COMPARISON:  None Available. FINDINGS: The bowel gas pattern is normal. No radio-opaque calculi or other significant radiographic abnormality are seen. IMPRESSION: Negative. Electronically Signed   By: Marijo Conception M.D.   On: 04/26/2022 13:25    Scheduled Meds:    insulin aspart  0-9 Units Subcutaneous Q4H   insulin glargine-yfgn  12 Units Subcutaneous Daily   metoCLOPramide (REGLAN) injection  10 mg Intravenous Q6H   metoprolol tartrate  5 mg Intravenous Q6H   pantoprazole (PROTONIX) IV  40 mg Intravenous Q12H   prochlorperazine  10 mg Intravenous Q6H   rosuvastatin  20 mg Oral Daily   scopolamine  1 patch Transdermal Q72H    Continuous Infusions:    promethazine (PHENERGAN) injection (IM or IVPB)       LOS: 3 days     Vernell Leep, MD,  FACP, [facility], [facility], [facility] (Care Management Physician Certified) [place]  To contact the attending provider between 7A-7P or the covering provider during after hours 7P-7A, please log into the web site  [email or web address] and access using universal [place] password for that web site. If you do not have the password, please call the hospital operator.  04/27/2022, 3:31 PM

## 2022-04-28 DIAGNOSIS — K920 Hematemesis: Secondary | ICD-10-CM | POA: Diagnosis not present

## 2022-04-28 DIAGNOSIS — E1143 Type 2 diabetes mellitus with diabetic autonomic (poly)neuropathy: Secondary | ICD-10-CM

## 2022-04-28 DIAGNOSIS — N179 Acute kidney failure, unspecified: Secondary | ICD-10-CM | POA: Diagnosis not present

## 2022-04-28 DIAGNOSIS — R1114 Bilious vomiting: Secondary | ICD-10-CM

## 2022-04-28 DIAGNOSIS — K3184 Gastroparesis: Secondary | ICD-10-CM

## 2022-04-28 DIAGNOSIS — R112 Nausea with vomiting, unspecified: Secondary | ICD-10-CM | POA: Diagnosis not present

## 2022-04-28 DIAGNOSIS — K209 Esophagitis, unspecified without bleeding: Secondary | ICD-10-CM

## 2022-04-28 DIAGNOSIS — R1013 Epigastric pain: Secondary | ICD-10-CM

## 2022-04-28 LAB — BASIC METABOLIC PANEL
Anion gap: 5 (ref 5–15)
BUN: 22 mg/dL — ABNORMAL HIGH (ref 6–20)
CO2: 24 mmol/L (ref 22–32)
Calcium: 7.6 mg/dL — ABNORMAL LOW (ref 8.9–10.3)
Chloride: 110 mmol/L (ref 98–111)
Creatinine, Ser: 2.71 mg/dL — ABNORMAL HIGH (ref 0.44–1.00)
GFR, Estimated: 21 mL/min — ABNORMAL LOW (ref >60)
Glucose, Bld: 152 mg/dL — ABNORMAL HIGH (ref 70–99)
Potassium: 3.5 mmol/L (ref 3.5–5.1)
Sodium: 139 mmol/L (ref 135–145)

## 2022-04-28 LAB — GLUCOSE, CAPILLARY
Glucose-Capillary: 103 mg/dL — ABNORMAL HIGH (ref 70–99)
Glucose-Capillary: 151 mg/dL — ABNORMAL HIGH (ref 70–99)
Glucose-Capillary: 153 mg/dL — ABNORMAL HIGH (ref 70–99)
Glucose-Capillary: 165 mg/dL — ABNORMAL HIGH (ref 70–99)
Glucose-Capillary: 182 mg/dL — ABNORMAL HIGH (ref 70–99)
Glucose-Capillary: 81 mg/dL (ref 70–99)

## 2022-04-28 LAB — CBC
HCT: 24.5 % — ABNORMAL LOW (ref 36.0–46.0)
Hemoglobin: 7.8 g/dL — ABNORMAL LOW (ref 12.0–15.0)
MCH: 28.3 pg (ref 26.0–34.0)
MCHC: 31.8 g/dL (ref 30.0–36.0)
MCV: 88.8 fL (ref 80.0–100.0)
Platelets: 161 10*3/uL (ref 150–400)
RBC: 2.76 MIL/uL — ABNORMAL LOW (ref 3.87–5.11)
RDW: 14 % (ref 11.5–15.5)
WBC: 8.5 10*3/uL (ref 4.0–10.5)
nRBC: 0 % (ref 0.0–0.2)

## 2022-04-28 MED ORDER — PANTOPRAZOLE SODIUM 40 MG PO TBEC
40.0000 mg | DELAYED_RELEASE_TABLET | Freq: Two times a day (BID) | ORAL | Status: DC
  Administered 2022-04-28 – 2022-04-29 (×2): 40 mg via ORAL
  Filled 2022-04-28 (×2): qty 1

## 2022-04-28 MED ORDER — LORAZEPAM 1 MG PO TABS: 1.0000 mg | ORAL_TABLET | Freq: Three times a day (TID) | ORAL | Status: DC | PRN

## 2022-04-28 MED ORDER — PROCHLORPERAZINE MALEATE 10 MG PO TABS
10.0000 mg | ORAL_TABLET | Freq: Four times a day (QID) | ORAL | Status: DC
  Administered 2022-04-28 – 2022-04-29 (×3): 10 mg via ORAL
  Filled 2022-04-28 (×3): qty 1

## 2022-04-28 NOTE — Progress Notes (Signed)
PROGRESS NOTE   [name]  MIW:[id number]    DOB: 08/03/1971    DOA: 04/23/2022  PCP: [name], PA   I have briefly reviewed patients previous medical records in [facility].  Chief Complaint  Patient presents with   Emesis    Brief Narrative:  51 year old female with medical history significant for type 1 DM with insulin pump, complicated by diabetic neuropathy and gastroparesis, CKD 4, chronic diastolic CHF, HTN, asthma, anxiety and depression, recent hospitalization 04/05/2022 - 04/11/2022 for syncope, cardiac arrest that required on field AED shock, repeat echo showed EF dropped to 40-45%, seen by cardiology and underwent AICD placement 04/09/2022, prior to that admitted 8/28 - 8/30 for hematemesis and EGD revealed grade a esophagitis with active bleeding, presented to the ED on 04/23/2022 with complaints of nausea and vomiting for past 5 days, constant, persistent and had been seen in the ED for same symptoms 2 days prior.  Unable to keep p.o. down including Phenergan.  Admitted for intractable nausea and vomiting, likely flare of diabetic gastroparesis, hematemesis and acute on chronic kidney disease.  [facility] consulting and assisting with care.  Nausea and vomiting and unable to keep any oral intake down including meds.  Clinically has improved.  Tolerating soft diet.  Weaning down some of her antiemetics, watching an additional day given her initial severe presentation and if remains okay, DC home in a.m.  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  EGD 8/29: Grade a reflux esophagitis without bleeding.  GES 04/01/2019: Normal.  Continue PPI IV twice daily until able to tolerate orally, scopolamine patch every 72 hours, scheduled IV Reglan 10 mg every 6 hours.  Despite these measures, change in her symptoms.  Unable to keep anything down including meds.  GI has now changed her to scheduled IV Compazine, changed Phenergan to IV as needed and added as needed IV Ativan which helped her 2 days ago.  Remains on clear liquids.  As per GI, no plans for repeat EGD.  Eventual outpatient GES when off antiemetic therapy.  Minimize narcotics.  Continue gentle IVF.  Negative head CT 04/08/22.  EKG 9/22 personally reviewed: QTc 454 ms.  KUB negative.  Has significantly improved over the last 48 hours.  Last episode of emesis was on 9/22 morning.  Since then has tolerated soft diet, no abdominal pain, having BMs.  GI has changed her Compazine to p.o. scheduled, plan is to watch her overnight and if doing okay then changed Reglan to p.o. and hopeful discharge.  EKG 9/24 personally reviewed: QTc 463 ms.  As discussed with Dr. Rush Landmark, will change Ativan to p.o. as needed.  Hematemesis Concern for hematemesis again with the coffee ground emesis report, HGB 10 on admission is actually better than DC earlier this month. Had hematemesis from esophagitis during admit earlier this month / end of last month.  Continue twice daily PPI.  AGI input as noted above.  No further hematemesis.  Follow CBC daily and transfuse if hemoglobin 7 g or less.  Hemoglobin fluctuating widely but essentially stable since admission.   Acute kidney injury complicating stage 3b chronic kidney disease (CKD) (HCC) Profound worsening in kidney function over course of this past year it looks like, not actually clear how bad her baseline is given  recent hospital stays.  Creatinine has gone up from 2.78-3.03.  Treated with IV fluids, avoid nephrotoxic's and follow BMP daily.  Renal ultrasound without acute findings.  AKI may be multifactorial, prerenal due to nausea/vomiting or even due to ATN but cannot be sure.  Creatinine has fluctuated in the 2.5-2.7 range over the last few days, likely closer to her baseline.  Follow BMP.    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  Reasonable inpatient control.  Adjust insulins as needed.  No DKA currently.   Essential hypertension Resumed prior p.o. meds including BiDil and metoprolol.  However unable to tolerate her p.o. meds during which transitioned to scheduled IV metoprolol.  Now that she is tolerating p.o. meds, resumed home meds including BiDil and oral metoprolol.  BP controlled.   Chronic systolic CHF (congestive heart failure) (HCC)/s/p cardiac arrest/s/p AICD placement Hold diuretics in setting of AKI and likely dehydration from N/V. Resumed BiDil and metoprolol.  Recent cardiac arrest, improved LVEF, 50-55% , low normal by TTE 9/3. AICD placement history as noted above.  Clinically euvolemic.  Hypernatremia: May be related to GI losses and IVF hydration.  Changed to half-normal saline.  Resolved.  DC IVF.  Body mass index is 23.59 kg/m.    DVT prophylaxis: Place and maintain sequential compression device Start: 04/23/22 2024     Code Status: Full Code:  Family Communication: None at bedside. Disposition:  Status is: Inpatient.  Slowly improving.  Hopeful DC home 9/25.     Consultants:   [facility]  Procedures:     Antimicrobials:      Subjective:  No vomiting since 9/22 AM.  Tolerating soft diet, having BMs, no abdominal pain.  Seems eager to go home.  Objective:   Vitals:   04/27/22 1753 04/27/22 1936 04/28/22 0408 04/28/22 1233  BP: (!) 153/88 139/68 138/73 122/72   Pulse: 80 78 81 82  Resp:  $Remo'16 18 16  'FMwWf$ Temp:  98.6 F (37 C) (!) 100.7 F (38.2 C) 99.7 F (37.6 C)  TempSrc:   Oral Oral  SpO2:  94% 94% 97%  Weight:      Height:        General exam: Young female, moderately built and nourished lying comfortably propped up in bed without distress.  Looks improved over the last couple of days Respiratory system: Clear to auscultation.  No increased work of breathing. Cardiovascular system: S1 & S2 heard, RRR. No JVD, murmurs, rubs, gallops or clicks. No pedal edema.  Telemetry personally reviewed: Sinus rhythm. Gastrointestinal system: Abdomen nondistended, soft and nontender.  Normal bowel sounds heard. Central nervous system: Alert and oriented this morning. No focal neurological deficits. Extremities: Symmetric 5 x 5 power. Skin: No rashes, lesions or ulcers Psychiatry: Judgement and insight appear normal. Mood & affect appropriate.     Data Reviewed:   I have personally reviewed following labs and imaging studies   CBC: Recent Labs  Lab 04/23/22 1242 04/23/22 1251 04/26/22 0535 04/27/22 0533 04/28/22 0528  WBC 8.6   < > 8.4 6.0 8.5  NEUTROABS 7.4  --   --   --   --   HGB 10.2*   < > 9.8* 8.4* 7.8*  HCT 31.4*   < > 30.7* 27.2* 24.5*  MCV 84.4   < > 88.7 90.1 88.8  PLT 270   < > 200 182 161   < > = values in this interval not displayed.    Basic Metabolic Panel: Recent Labs  Lab 04/25/22 0523 04/25/22 0740 04/26/22 0535 04/27/22 0533 04/28/22 0528  NA 126* 142 141 142 139  K 2.5* 4.1 3.8 3.7 3.5  CL 105 114* 113* 114* 110  CO2 17* $Remov'22 22 22 24  'EoCTef$ GLUCOSE 112* 173* 135* 111* 152*  BUN $Re'15 19 17 17 'UFY$ 22*  CREATININE 2.01* 2.77* 2.59* 2.57* 2.71*  CALCIUM 5.9* 8.2* 9.1 8.3* 7.6*  MG  --  1.8  --   --   --     Liver Function Tests: Recent Labs  Lab 04/23/22 1242 04/24/22 0541 04/26/22 0535  AST 10* 10* 12*  ALT $Re'8 10 9  'gDu$ ALKPHOS 80 66 71  BILITOT 0.9 1.0 0.9  PROT 7.2 5.9* 6.2*  ALBUMIN 3.8 2.8* 2.9*    CBG: Recent  Labs  Lab 04/28/22 0406 04/28/22 0731 04/28/22 1108  GLUCAP 151* 103* 182*    Microbiology Studies:   Recent Results (from the past 240 hour(s))  Resp Panel by RT-PCR (Flu A&B, Covid) Anterior Nasal Swab     Status: None   Collection Time: 04/21/22 10:03 AM   Specimen: Anterior Nasal Swab  Result Value Ref Range Status   SARS Coronavirus 2 by RT PCR NEGATIVE NEGATIVE Final    Comment: (NOTE) SARS-CoV-2 target nucleic acids are NOT DETECTED.  The SARS-CoV-2 RNA is generally detectable in upper respiratory specimens during the acute phase of infection. The lowest concentration of SARS-CoV-2 viral copies this assay can detect is 138 copies/mL. A negative result does not preclude SARS-Cov-2 infection and should not be used as the sole basis for treatment or other patient management decisions. A negative result may occur with  improper specimen collection/handling, submission of specimen other than nasopharyngeal swab, presence of viral mutation(s) within the areas targeted by this assay, and inadequate number of viral copies(<138 copies/mL). A negative result must be combined with clinical observations, patient history, and epidemiological information. The expected result is Negative.  Fact Sheet for Patients:  EntrepreneurPulse.com.au  Fact Sheet for Healthcare Providers:  IncredibleEmployment.be  This test is no t yet approved or cleared by the [place] FDA and  has been authorized for detection and/or diagnosis of SARS-CoV-2 by FDA under an Emergency Use Authorization (EUA). This EUA will remain  in effect (meaning this test can be used) for the duration of the COVID-19 declaration under Section 564(b)(1) of the Act, 21 U.S.C.section 360bbb-3(b)(1), unless the authorization is terminated  or revoked sooner.       Influenza A by PCR NEGATIVE NEGATIVE Final   Influenza B by PCR NEGATIVE NEGATIVE Final    Comment: (NOTE) The Xpert  Xpress SARS-CoV-2/FLU/RSV plus assay is intended as an aid in the diagnosis of influenza from Nasopharyngeal swab specimens and should not be used as a sole basis for treatment. Nasal washings and aspirates are unacceptable for Xpert Xpress SARS-CoV-2/FLU/RSV testing.  Fact Sheet for Patients: EntrepreneurPulse.com.au  Fact Sheet for Healthcare Providers: IncredibleEmployment.be  This test is not yet approved or cleared by the [place] FDA and has been authorized for detection and/or diagnosis of SARS-CoV-2 by FDA  under an Emergency Use Authorization (EUA). This EUA will remain in effect (meaning this test can be used) for the duration of the COVID-19 declaration under Section 564(b)(1) of the Act, 21 U.S.C. section 360bbb-3(b)(1), unless the authorization is terminated or revoked.  Performed at [facility], [address] [place], [place] [address]     Radiology Studies:  No results found.  Scheduled Meds:    insulin aspart  0-9 Units Subcutaneous Q4H   insulin glargine-yfgn  12 Units Subcutaneous Daily   isosorbide-hydrALAZINE  1 tablet Oral TID   metoCLOPramide (REGLAN) injection  10 mg Intravenous Q6H   metoprolol succinate  25 mg Oral Daily   pantoprazole  40 mg Oral BID   prochlorperazine  10 mg Oral Q6H   rosuvastatin  20 mg Oral Daily   scopolamine  1 patch Transdermal Q72H    Continuous Infusions:    promethazine (PHENERGAN) injection (IM or IVPB)       LOS: 4 days     Vernell Leep, MD,  FACP, [facility], [facility], [facility] (Care Management Physician Certified) [place]  To contact the attending provider between 7A-7P or the covering provider during after hours 7P-7A, please log into the web site [email or web address] and access using universal [place] password for that web site. If you do not have the password, please call the hospital operator.  04/28/2022, 1:25 PM

## 2022-04-28 NOTE — Progress Notes (Addendum)
Gastroenterology Inpatient Follow-up Note   PATIENT IDENTIFICATION  [name] is a 51 y.o. female with a pmh significant for CHF, IDDM, CRI, esophagitis, possible gastroparesis (years ago normal emptying study pending updated).  The patient was admitted with recurrent nausea/vomiting and pain with episodes of coffee-ground emesis. Hospital Day: 6  SUBJECTIVE  The patient's chart has been reviewed. The patient's labs have been reviewed.  She has evidence of a slight anemia which is similar to a few days ago and bumped itself back up.  Her iron labs showed no evidence of iron deficiency and she has not had any overt GI bleeding. The patient received each dose of IV Reglan and IV Compazine. She continued to use IV Ativan. She did not require Phenergan. Today the patient is doing relatively well and is seen after eating meatloaf for lunch. No new abdominal pain or discomfort. She is hopeful that she is taking a turn to continue to have improvement.   OBJECTIVE  Scheduled Inpatient Medications:   insulin aspart  0-9 Units Subcutaneous Q4H   insulin glargine-yfgn  12 Units Subcutaneous Daily   isosorbide-hydrALAZINE  1 tablet Oral TID   metoCLOPramide (REGLAN) injection  10 mg Intravenous Q6H   metoprolol succinate  25 mg Oral Daily   pantoprazole  40 mg Oral BID   prochlorperazine  10 mg Intravenous Q6H   rosuvastatin  20 mg Oral Daily   scopolamine  1 patch Transdermal Q72H   Continuous Inpatient Infusions:   promethazine (PHENERGAN) injection (IM or IVPB)     PRN Inpatient Medications: acetaminophen **OR** acetaminophen, bisacodyl, hydrALAZINE, LORazepam, mouth rinse, polyethylene glycol, polyvinyl alcohol, promethazine (PHENERGAN) injection (IM or IVPB)   Physical Examination  Temp:  [98.5 F (36.9 C)-100.7 F (38.2 C)] 99.7 F (37.6 C) (09/24 1233) Pulse Rate:  [78-83] 82 (09/24 1233) Resp:  [16-18] 16 (09/24 1233) BP: (122-158)/(68-88) 122/72 (09/24 1233) SpO2:   [94 %-97 %] 97 % (09/24 1233) Temp (24hrs), Avg:99.4 F (37.4 C), Min:98.5 F (36.9 C), Max:100.7 F (38.2 C)  Weight: 78.9 kg GEN: NAD, appears older than stated age, appears chronically ill but is nontoxic PSYCH: Cooperative, without pressured speech EYE: Conjunctivae pink, sclerae anicteric ENT: MMM CV: Nontachycardic RESP: No audible wheezing GI: NABS, soft, protuberant abdomen, nondistended,  MSK/EXT: Bilateral pedal edema present SKIN: No jaundice NEURO:  Alert & Oriented x 3, no focal deficits   Review of Data   Laboratory Studies   Recent Labs  Lab 04/25/22 0740 04/26/22 0535 04/28/22 0528  NA 142   < > 139  K 4.1   < > 3.5  CL 114*   < > 110  CO2 22   < > 24  BUN 19   < > 22*  CREATININE 2.77*   < > 2.71*  GLUCOSE 173*   < > 152*  CALCIUM 8.2*   < > 7.6*  MG 1.8  --   --    < > = values in this interval not displayed.   Recent Labs  Lab 04/26/22 0535  AST 12*  ALT 9  ALKPHOS 71    Recent Labs  Lab 04/26/22 0535 04/27/22 0533 04/28/22 0528  WBC 8.4 6.0 8.5  HGB 9.8* 8.4* 7.8*  HCT 30.7* 27.2* 24.5*  PLT 200 182 161   No results for input(s): "APTT", "INR" in the last 168 hours.  Imaging Studies  DG Abd 1 View  Result Date: 04/26/2022 CLINICAL DATA:  Nausea, vomiting. EXAM: ABDOMEN - 1 VIEW COMPARISON:  None Available. FINDINGS: The bowel gas pattern is normal. No radio-opaque calculi or other significant radiographic abnormality are seen. IMPRESSION: Negative. Electronically Signed   By: Marijo Conception M.D.   On: 04/26/2022 13:25    GI Procedures and Studies  No new studies to review   ASSESSMENT  [name] is a 51 y.o. female with a pmh significant for CHF, IDDM, CRI, esophagitis, possible gastroparesis (years ago normal emptying study pending updated).  The patient was admitted with recurrent nausea/vomiting and pain with episodes of coffee-ground emesis.  The patient is hemodynamically and clinically stable.  She is continuing to make  improvements from yesterday into today.  I think what I would like to do is transition her Compazine to oral Compazine today.  If she is doing well then hopefully we can transition her Reglan to oral Reglan tomorrow.  Time will tell.  Hopefully should be able to get out of the hospital in the next couple of days.  All patient questions were answered to the best of my ability, and the patient agrees to the aforementioned plan of action with follow-up as indicated.   PLAN/RECOMMENDATIONS  IV Reglan 10 mg every 6 hours with hope of transitioning to p.o. tomorrow Transition to p.o. Compazine 10 mg every 6 hours staggered IV Ativan 1 mg every 12 hours but consider transitioning to p.o. Ativan today if able Scopolamine every 72 hours IV Phenergan 12.5 mg as needed failure of above Daily EKG so QTc can be monitored Daily potassium/magnesium to be monitored Advance diet as tolerated   Please page/call with questions or concerns.   Justice Britain, MD [facility] Office # [phone]    LOS: 4 days  Irving Copas  04/28/2022, 1:15 PM

## 2022-04-29 DIAGNOSIS — E1143 Type 2 diabetes mellitus with diabetic autonomic (poly)neuropathy: Secondary | ICD-10-CM | POA: Diagnosis not present

## 2022-04-29 DIAGNOSIS — K3184 Gastroparesis: Secondary | ICD-10-CM | POA: Diagnosis not present

## 2022-04-29 DIAGNOSIS — R112 Nausea with vomiting, unspecified: Secondary | ICD-10-CM | POA: Diagnosis not present

## 2022-04-29 LAB — CBC
HCT: 23.9 % — ABNORMAL LOW (ref 36.0–46.0)
Hemoglobin: 7.7 g/dL — ABNORMAL LOW (ref 12.0–15.0)
MCH: 28.5 pg (ref 26.0–34.0)
MCHC: 32.2 g/dL (ref 30.0–36.0)
MCV: 88.5 fL (ref 80.0–100.0)
Platelets: 156 10*3/uL (ref 150–400)
RBC: 2.7 MIL/uL — ABNORMAL LOW (ref 3.87–5.11)
RDW: 13.8 % (ref 11.5–15.5)
WBC: 7.4 10*3/uL (ref 4.0–10.5)
nRBC: 0 % (ref 0.0–0.2)

## 2022-04-29 LAB — BASIC METABOLIC PANEL
Anion gap: 6 (ref 5–15)
BUN: 21 mg/dL — ABNORMAL HIGH (ref 6–20)
CO2: 24 mmol/L (ref 22–32)
Calcium: 7.6 mg/dL — ABNORMAL LOW (ref 8.9–10.3)
Chloride: 109 mmol/L (ref 98–111)
Creatinine, Ser: 2.82 mg/dL — ABNORMAL HIGH (ref 0.44–1.00)
GFR, Estimated: 20 mL/min — ABNORMAL LOW (ref >60)
Glucose, Bld: 157 mg/dL — ABNORMAL HIGH (ref 70–99)
Potassium: 3.5 mmol/L (ref 3.5–5.1)
Sodium: 139 mmol/L (ref 135–145)

## 2022-04-29 LAB — GLUCOSE, CAPILLARY
Glucose-Capillary: 132 mg/dL — ABNORMAL HIGH (ref 70–99)
Glucose-Capillary: 155 mg/dL — ABNORMAL HIGH (ref 70–99)
Glucose-Capillary: 168 mg/dL — ABNORMAL HIGH (ref 70–99)
Glucose-Capillary: 168 mg/dL — ABNORMAL HIGH (ref 70–99)

## 2022-04-29 MED ORDER — METOCLOPRAMIDE HCL 5 MG PO TABS: 10.0000 mg | ORAL_TABLET | Freq: Three times a day (TID) | ORAL | Status: DC

## 2022-04-29 MED ORDER — ADULT MULTIVITAMIN W/MINERALS CH
1.0000 | ORAL_TABLET | Freq: Every day | ORAL | Status: DC
  Administered 2022-04-29: 1 via ORAL
  Filled 2022-04-29: qty 1

## 2022-04-29 MED ORDER — METOCLOPRAMIDE HCL 10 MG PO TABS: 10.0000 mg | ORAL_TABLET | Freq: Three times a day (TID) | ORAL | 0 refills | Status: DC

## 2022-04-29 MED ORDER — ENSURE MAX PROTEIN PO LIQD
11.0000 [oz_av] | Freq: Every day | ORAL | Status: DC
  Filled 2022-04-29: qty 330

## 2022-04-29 MED ORDER — ENSURE MAX PROTEIN PO LIQD: 11.0000 [oz_av] | Freq: Every day | ORAL | Status: DC

## 2022-04-29 MED ORDER — PANTOPRAZOLE SODIUM 40 MG PO TBEC: 40.0000 mg | DELAYED_RELEASE_TABLET | Freq: Two times a day (BID) | ORAL | 1 refills | Status: DC

## 2022-04-29 MED ORDER — ADULT MULTIVITAMIN W/MINERALS CH: 1.0000 | ORAL_TABLET | Freq: Every day | ORAL | Status: DC

## 2022-04-29 MED ORDER — PROCHLORPERAZINE MALEATE 10 MG PO TABS: 10.0000 mg | ORAL_TABLET | Freq: Three times a day (TID) | ORAL | 0 refills | Status: DC | PRN

## 2022-04-29 NOTE — Discharge Instructions (Signed)

## 2022-04-29 NOTE — Progress Notes (Addendum)
Daily Progress Note  Hospital Day: 7  Chief Complaint: nausea / vomiting  Brief History [name] is a 51 y.o. female with a pmh not limited to  Type ! DM, possible gastroparesis, esophagitis, chronic anemia, CKD 4, HTN, chronic diastolic heart failure, cardiac arrest, AICD placement, anxiety , depression. Admitted several days ago with recurrent nausea / vomiting. We saw in consult on 9/20. Of note, she was hospitalized in late August with hematemesis. EGD >> esophagitis.   Assessment / Plan   # Nausea / vomiting, possibly secondary to underlying gastroparesis in setting of Type I DM.  Will transition IV Reglan to PO. Pharmacy contacted me. Due to renal function Reglan dose needs to be limited to total of 30 mg / day. Will change order to 10 mg TID ac.    Compazine and Pantoprazole already PO Ativan is PO but not taking it Continue Scopolamine patch every 72 hours K+ low normal at 3.5 She already has a follow up scheduled with Dr. Loletha Carrow on 10/25 at 8:40am.   # Chronic anemia in setting of CKD. Hgb overall stable at 7.7. No evidence for IDA.    Subjective   Feels much better. No nausea. No abdominal pain. Tolerated solids for breakfast.   Objective   Endoscopic studies:   04/02/22 EGD - LA Grade A reflux esophagitis with no bleeding. Biopsied. - A few gastric polyps. - No active bleeding   Imaging:  DG Abd 1 View  Result Date: 04/26/2022 CLINICAL DATA:  Nausea, vomiting. EXAM: ABDOMEN - 1 VIEW COMPARISON:  None Available. FINDINGS: The bowel gas pattern is normal. No radio-opaque calculi or other significant radiographic abnormality are seen. IMPRESSION: Negative. Electronically Signed   By: Marijo Conception M.D.   On: 04/26/2022 13:25    Lab Results: Recent Labs    04/27/22 0533 04/28/22 0528 04/29/22 0452  WBC 6.0 8.5 7.4  HGB 8.4* 7.8* 7.7*  HCT 27.2* 24.5* 23.9*  PLT 182 161 156   BMET Recent Labs    04/27/22 0533 04/28/22 0528 04/29/22 0452   NA 142 139 139  K 3.7 3.5 3.5  CL 114* 110 109  CO2 $Re'22 24 24  'ZzM$ GLUCOSE 111* 152* 157*  BUN 17 22* 21*  CREATININE 2.57* 2.71* 2.82*  CALCIUM 8.3* 7.6* 7.6*   LFT No results for input(s): "PROT", "ALBUMIN", "AST", "ALT", "ALKPHOS", "BILITOT", "BILIDIR", "IBILI" in the last 72 hours. PT/INR No results for input(s): "LABPROT", "INR" in the last 72 hours.   Scheduled inpatient medications:   insulin aspart  0-9 Units Subcutaneous Q4H   insulin glargine-yfgn  12 Units Subcutaneous Daily   isosorbide-hydrALAZINE  1 tablet Oral TID   metoCLOPramide (REGLAN) injection  10 mg Intravenous Q6H   metoprolol succinate  25 mg Oral Daily   multivitamin with minerals  1 tablet Oral Daily   pantoprazole  40 mg Oral BID   prochlorperazine  10 mg Oral Q6H   Ensure Max Protein  11 oz Oral Daily   rosuvastatin  20 mg Oral Daily   scopolamine  1 patch Transdermal Q72H   Continuous inpatient infusions:   promethazine (PHENERGAN) injection (IM or IVPB)     PRN inpatient medications: acetaminophen **OR** acetaminophen, bisacodyl, hydrALAZINE, LORazepam, mouth rinse, polyethylene glycol, polyvinyl alcohol, promethazine (PHENERGAN) injection (IM or IVPB)  Vital signs in last 24 hours: Temp:  [99.1 F (37.3 C)-99.7 F (37.6 C)] 99.1 F (37.3 C) (09/25 0419) Pulse Rate:  [77-82] 77 (09/25 0419) Resp:  [  16-18] 18 (09/25 0419) BP: (122-142)/(61-79) 142/79 (09/25 0419) SpO2:  [95 %-97 %] 97 % (09/25 0419) Last BM Date : 04/26/22  Intake/Output Summary (Last 24 hours) at 04/29/2022 0908 Last data filed at 04/29/2022 1820 Gross per 24 hour  Intake --  Output 1000 ml  Net -1000 ml    Intake/Output from previous day: 09/24 0701 - 09/25 0700 In: 480 [P.O.:480] Out: 1000 [Urine:1000] Intake/Output this shift: No intake/output data recorded.   Physical Exam:  General: Alert female in NAD Heart:  Regular rate and rhythm. No lower extremity edema Pulmonary: Normal respiratory effort Abdomen:  Soft, nondistended, nontender. Normal bowel sounds.  Neurologic: Alert and oriented Psych: Pleasant. Cooperative.    Principal Problem:   Intractable nausea and vomiting Active Problems:   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)   Chronic diastolic CHF (congestive heart failure) (HCC)   Diabetic gastroparesis (HCC)   AKI (acute kidney injury) ([place])   Stage 3b chronic kidney disease (CKD) (HCC)   Coffee ground emesis   Acute esophagitis   Essential hypertension   Anemia   History of esophagitis   Chronic vomiting   Abdominal pain, epigastric     LOS: 5 days   Tye Savoy ,NP 04/29/2022, 9:08 AM

## 2022-04-29 NOTE — Progress Notes (Signed)
Mobility Specialist - Progress Note   04/29/22 0950  Mobility  Activity Ambulated independently in hallway  Level of Assistance Independent  Assistive Device None  Distance Ambulated (ft) 350 ft  Activity Response Tolerated well  $Mobility charge 1 Mobility   Pt received on window bench and agreed for mobility, no c/o pain nor discomfort during ambulation. Pt back to window bench with all needs met.   Roderick Pee Mobility Specialist

## 2022-04-29 NOTE — Discharge Summary (Signed)
Physician Discharge Summary  [name] CEY:[id number] DOB: 03/19/1971  PCP: [name], PA  Admitted from: Home Discharged to: Home  Admit date: 04/23/2022 Discharge date: 04/29/2022  Recommendations for Outpatient Follow-up:    Follow-up Information     [place], [place]. Schedule an appointment as soon as possible for a visit in 1 week(s).   Specialty: Internal Medicine Why: To be seen with repeat labs (CBC, CMP, magnesium, EKG). Contact information: [place] [address] [phone]         [name], MD .   Specialty: Cardiology Contact information: [id number] [place] [address] [phone]         Mealor, Yetta Barre, MD .   Specialty: Cardiology Contact information: [place] [address] [phone]         Doran Stabler, MD Follow up on 05/29/2022.   Specialty: Gastroenterology Why: 8:40am. Contact information: [place] [address] [phone]                  Home Health: None    Equipment/Devices: None    Discharge Condition: Improved and stable.   Code Status: Prior Diet recommendation:  Discharge Diet Orders (From admission, onward)     Start     Ordered   04/29/22 0000  Diet - low sodium heart healthy        04/29/22 1155   04/29/22 0000  Diet Carb Modified        04/29/22 1155             Discharge Diagnoses:  Principal Problem:   Intractable nausea and vomiting Active Problems:   Diabetic gastroparesis (HCC)   Coffee ground emesis   AKI (acute kidney injury) ([place])   Stage 3b chronic kidney disease (CKD) (HCC)   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)   Essential hypertension   Chronic diastolic CHF (congestive heart failure) (HCC)   Acute esophagitis   Anemia   History of esophagitis   Chronic vomiting   Abdominal pain, epigastric   Brief  Summary: 51 year old female with medical history significant for type 1 DM with insulin pump, complicated by diabetic neuropathy and gastroparesis, CKD 4, chronic diastolic CHF, HTN, asthma, anxiety and depression, recent hospitalization 04/05/2022 - 04/11/2022 for syncope, cardiac arrest that required on field AED shock, repeat echo showed EF dropped to 40-45%, seen by cardiology and underwent AICD placement 04/09/2022, prior to that admitted 8/28 - 8/30 for hematemesis and EGD revealed grade a esophagitis with active bleeding, presented to the ED on 04/23/2022 with complaints of nausea and vomiting for past 5 days, constant, persistent and had been seen in the ED for same symptoms 2 days prior.  Unable to keep p.o. down including Phenergan.  Admitted for intractable nausea and vomiting, likely flare of diabetic gastroparesis, hematemesis and acute on chronic kidney disease.  [facility] consulting and assisted with care.  For the first couple of days, she had persistent nausea and vomiting despite multiple medication adjustments/antiemetics.  Finally after addition of Ativan as needed, her symptoms subsided, she was able to tolerate advancing diet.   Assessment & Plan:   Intractable nausea and vomiting, suspecting diabetic gastroparesis/esophagitis flare: [facility] consulted and assisting with care, appreciated.  EGD 8/29: Grade a reflux esophagitis without bleeding.  GES 04/01/2019: Normal.  GI was consulted and assisted with management.  Several medication  adjustments were made.  For the first few days, she continued to have intractable nausea and vomiting despite scheduled Reglan, scheduled Phenergan, twice daily IV PPI and scopolamine patch.  Phenergan was then switched to Compazine scheduled with continued symptoms.  Finally Ativan 1 mg every 8 hourly as needed was added with improvement in symptoms.  Diet was then gradually advanced and tolerated.  For the last 72 hours, she has had no further vomiting.  As per  GI, no plans for repeat EGD.  Eventual outpatient GES when off antiemetic therapy.  Negative head CT 04/08/22.  EKG was monitored closely for QTc.  KUB negative.  GI has seen today and cleared for discharge.  I discussed with GI who recommended Reglan, Compazine, Protonix as noted below and no Ativan or Phenergan at discharge.  She has an outpatient follow-up with GI.  Hematemesis Concern for hematemesis again with the coffee ground emesis report, Had hematemesis from esophagitis during admit earlier this month / end of last month.  Continue twice daily PPI.  GI input as noted above.  No further hematemesis.  Hemoglobin fluctuating widely but essentially stable since admission.   Acute kidney injury complicating stage 3b chronic kidney disease (CKD) (HCC) Profound worsening in kidney function over course of this past year it looks like, not actually clear how bad her baseline is given recent hospital stays.  Creatinine has gone up from 2.78-3.03.  Treated with IV fluids, avoid nephrotoxic's and follow BMP daily.  Renal ultrasound without acute findings.  AKI may be multifactorial, prerenal due to nausea/vomiting or even due to ATN but cannot be sure.  Creatinine has fluctuated in the 2.5-2.7 range over the last few days, likely closer to her baseline.  Creatinine slightly higher at 2.82.  Clinically appears euvolemic.  Follow BMP closely as outpatient.    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) Treated in the hospital with Lantus 12u Q24H, got first dose in ED, SSI and monitor CBGs closely.  Reasonable inpatient control.  Resume home regimen at discharge.   Essential hypertension Continue BiDil and Toprol-XL.   Chronic systolic CHF (congestive heart failure) (HCC)/s/p cardiac arrest/s/p AICD placement Held diuretics in setting of AKI and likely dehydration from N/V. Resumed BiDil and metoprolol.  Recent cardiac arrest, improved LVEF, 50-55% , low normal by TTE 9/3. AICD placement  history as noted above.  Clinically euvolemic.  Current new as needed Lasix as she was on PTA.   Hypernatremia: May be related to GI losses and IVF hydration.  Changed to half-normal saline.  Resolved.  DC IVF.   Body mass index is 23.59 kg/m.     Consultants:   [facility]   Procedures:      Discharge Instructions  Discharge Instructions     (HEART FAILURE PATIENTS) Call MD:  Anytime you have any of the following symptoms: 1) 3 pound weight gain in 24 hours or 5 pounds in 1 week 2) shortness of breath, with or without a dry hacking cough 3) swelling in the hands, feet or stomach 4) if you have to sleep on extra pillows at night in order to breathe.   Complete by: As directed    Call MD for:  difficulty breathing, headache or visual disturbances   Complete by: As directed    Call MD for:  extreme fatigue   Complete by: As directed    Call MD for:  persistant dizziness or light-headedness   Complete by: As directed    Call MD  for:  persistant nausea and vomiting   Complete by: As directed    Call MD for:  severe uncontrolled pain   Complete by: As directed    Call MD for:  temperature >100.4   Complete by: As directed    Diet - low sodium heart healthy   Complete by: As directed    Diet Carb Modified   Complete by: As directed    Increase activity slowly   Complete by: As directed    No wound care   Complete by: As directed         Medication List     STOP taking these medications    promethazine 12.5 MG tablet Commonly known as: PHENERGAN   Promethegan 25 MG suppository Generic drug: promethazine       TAKE these medications    acetaminophen 500 MG tablet Commonly known as: TYLENOL Take 1,000 mg by mouth 3 (three) times daily as needed for moderate pain.   albuterol 108 (90 Base) MCG/ACT inhaler Commonly known as: ProAir HFA Inhale 2 puffs into the lungs every 6 (six) hours as needed for wheezing or shortness of breath.   Ensure Max Protein  Liqd Take 330 mLs (11 oz total) by mouth daily. Start taking on: April 30, 2022   FreeStyle Libre 2 Sensor Misc Inject 1 Device into the skin every 14 (fourteen) days.   isosorbide-hydrALAZINE 20-37.5 MG tablet Commonly known as: BIDIL Take 1 tablet by mouth 3 (three) times daily.   LUBRICATING EYE DROPS OP Place 1-2 drops into both eyes daily as needed (dry eyes).   metoCLOPramide 10 MG tablet Commonly known as: Reglan Take 1 tablet (10 mg total) by mouth 3 (three) times daily before meals. What changed:  when to take this reasons to take this   metoprolol succinate 25 MG 24 hr tablet Commonly known as: Toprol XL Take 1 tablet (25 mg total) by mouth daily.   multivitamin with minerals Tabs tablet Take 1 tablet by mouth daily. Start taking on: April 30, 2022   NovoLOG 100 UNIT/ML injection Generic drug: insulin aspart Inject 0-120 Units into the skin daily. Per sliding scale per patient. Patient states it is an insulin pump   OmniPod 10 Pack Misc by Does not apply route.   pantoprazole 40 MG tablet Commonly known as: Protonix Take 1 tablet (40 mg total) by mouth 2 (two) times daily before a meal. What changed: when to take this   polyethylene glycol 17 g packet Commonly known as: MIRALAX / GLYCOLAX Take 17 g by mouth daily as needed for moderate constipation.   pregabalin 50 MG capsule Commonly known as: LYRICA Take 50 mg by mouth 2 (two) times daily as needed (neuropathy).   prochlorperazine 10 MG tablet Commonly known as: COMPAZINE Take 1 tablet (10 mg total) by mouth every 8 (eight) hours as needed for nausea or vomiting.   rosuvastatin 20 MG tablet Commonly known as: CRESTOR Take 1 tablet (20 mg total) by mouth daily.   tobramycin 0.3 % ophthalmic solution Commonly known as: TOBREX Place 1-2 drops into both eyes See admin instructions. Instill 1-2 drops into both eyes 3 times daily the day before, day of, and the day after every 6 weeks eye  injections per patient   torsemide 20 MG tablet Commonly known as: DEMADEX Take 20 mg by mouth daily as needed (swelling).   [name] 200 UNIT/ML FlexTouch Pen Generic drug: insulin degludec Inject 50 Units into the skin daily as needed (when  insulin pump stops working).       Allergies  Allergen Reactions   Atorvastatin Other (See Comments)    Dizziness    Dulaglutide Nausea And Vomiting and Other (See Comments)    Caused pancreatitis    Zofran [Ondansetron] Nausea And Vomiting and Other (See Comments)    Causes nausea vomiting to worsen / doesn't really work for patient.   Amoxicillin Itching and Rash   Lantus [Insulin Glargine] Itching and Rash   Miconazole Nitrate Nausea And Vomiting and Rash      Procedures/Studies: DG Abd 1 View  Result Date: 04/26/2022 CLINICAL DATA:  Nausea, vomiting. EXAM: ABDOMEN - 1 VIEW COMPARISON:  None Available. FINDINGS: The bowel gas pattern is normal. No radio-opaque calculi or other significant radiographic abnormality are seen. IMPRESSION: Negative. Electronically Signed   By: Marijo Conception M.D.   On: 04/26/2022 13:25   US RENAL  Result Date: 04/23/2022 CLINICAL DATA:  Acute renal insufficiency EXAM: RENAL / URINARY TRACT ULTRASOUND COMPLETE COMPARISON:  04/21/2022, 04/07/2022 FINDINGS: Right Kidney: Renal measurements: 12.2 x 4.7 by 4.8 cm = volume: 145 mL. Stable benign peripelvic cyst upper pole measuring 1.5 cm. Normal renal echotexture. No hydronephrosis. Left Kidney: Renal measurements: 12.0 x 6.2 x 5.5 cm = volume: 212 mL. Echogenicity within normal limits. No mass or hydronephrosis visualized. Bladder: Appears normal for degree of bladder distention. Other: None. IMPRESSION: 1. Stable renal ultrasound.  No acute findings. Electronically Signed   By: Randa Ngo M.D.   On: 04/23/2022 20:35     Subjective: Denies complaints.  Dressed up and ready to go.  Tolerating diet well.  Discharge Exam:  Vitals:   04/28/22  0408 04/28/22 1233 04/28/22 2018 04/29/22 0419  BP: 138/73 122/72 131/61 (!) 142/79  Pulse: 81 82 78 77  Resp: $Remo'18 16 18 18  'vprgJ$ Temp: (!) 100.7 F (38.2 C) 99.7 F (37.6 C) 99.1 F (37.3 C) 99.1 F (37.3 C)  TempSrc: Oral Oral    SpO2: 94% 97% 95% 97%  Weight:      Height:        General exam: Young female, moderately built and nourished lying comfortably propped up in bed without distress.   Respiratory system: Clear to auscultation.  No increased work of breathing. Cardiovascular system: S1 & S2 heard, RRR. No JVD, murmurs, rubs, gallops or clicks. No pedal edema.   Gastrointestinal system: Abdomen nondistended, soft and nontender.  Normal bowel sounds heard. Central nervous system: Alert and oriented this morning. No focal neurological deficits. Extremities: Symmetric 5 x 5 power. Skin: No rashes, lesions or ulcers Psychiatry: Judgement and insight appear normal. Mood & affect appropriate.     The results of significant diagnostics from this hospitalization (including imaging, microbiology, ancillary and laboratory) are listed below for reference.     Microbiology: Recent Results (from the past 240 hour(s))  Resp Panel by RT-PCR (Flu A&B, Covid) Anterior Nasal Swab     Status: None   Collection Time: 04/21/22 10:03 AM   Specimen: Anterior Nasal Swab  Result Value Ref Range Status   SARS Coronavirus 2 by RT PCR NEGATIVE NEGATIVE Final    Comment: (NOTE) SARS-CoV-2 target nucleic acids are NOT DETECTED.  The SARS-CoV-2 RNA is generally detectable in upper respiratory specimens during the acute phase of infection. The lowest concentration of SARS-CoV-2 viral copies this assay can detect is 138 copies/mL. A negative result does not preclude SARS-Cov-2 infection and should not be used as the sole basis for treatment or other  patient management decisions. A negative result may occur with  improper specimen collection/handling, submission of specimen other than nasopharyngeal  swab, presence of viral mutation(s) within the areas targeted by this assay, and inadequate number of viral copies(<138 copies/mL). A negative result must be combined with clinical observations, patient history, and epidemiological information. The expected result is Negative.  Fact Sheet for Patients:  EntrepreneurPulse.com.au  Fact Sheet for Healthcare Providers:  IncredibleEmployment.be  This test is no t yet approved or cleared by the [place] FDA and  has been authorized for detection and/or diagnosis of SARS-CoV-2 by FDA under an Emergency Use Authorization (EUA). This EUA will remain  in effect (meaning this test can be used) for the duration of the COVID-19 declaration under Section 564(b)(1) of the Act, 21 U.S.C.section 360bbb-3(b)(1), unless the authorization is terminated  or revoked sooner.       Influenza A by PCR NEGATIVE NEGATIVE Final   Influenza B by PCR NEGATIVE NEGATIVE Final    Comment: (NOTE) The Xpert Xpress SARS-CoV-2/FLU/RSV plus assay is intended as an aid in the diagnosis of influenza from Nasopharyngeal swab specimens and should not be used as a sole basis for treatment. Nasal washings and aspirates are unacceptable for Xpert Xpress SARS-CoV-2/FLU/RSV testing.  Fact Sheet for Patients: EntrepreneurPulse.com.au  Fact Sheet for Healthcare Providers: IncredibleEmployment.be  This test is not yet approved or cleared by the [place] FDA and has been authorized for detection and/or diagnosis of SARS-CoV-2 by FDA under an Emergency Use Authorization (EUA). This EUA will remain in effect (meaning this test can be used) for the duration of the COVID-19 declaration under Section 564(b)(1) of the Act, 21 U.S.C. section 360bbb-3(b)(1), unless the authorization is terminated or revoked.  Performed at [facility], [address], [place], [place] [address]       Labs: CBC: Recent Labs  Lab 04/23/22 1242 04/23/22 1251 04/25/22 0523 04/26/22 0535 04/27/22 0533 04/28/22 0528 04/29/22 0452  WBC 8.6   < > 7.1 8.4 6.0 8.5 7.4  NEUTROABS 7.4  --   --   --   --   --   --   HGB 10.2*   < > 7.9* 9.8* 8.4* 7.8* 7.7*  HCT 31.4*   < > 25.7* 30.7* 27.2* 24.5* 23.9*  MCV 84.4   < > 90.8 88.7 90.1 88.8 88.5  PLT 270   < > 205 200 182 161 156   < > = values in this interval not displayed.    Basic Metabolic Panel: Recent Labs  Lab 04/25/22 0740 04/26/22 0535 04/27/22 0533 04/28/22 0528 04/29/22 0452  NA 142 141 142 139 139  K 4.1 3.8 3.7 3.5 3.5  CL 114* 113* 114* 110 109  CO2 $Re'22 22 22 24 24  'JKD$ GLUCOSE 173* 135* 111* 152* 157*  BUN $Re'19 17 17 'uHQ$ 22* 21*  CREATININE 2.77* 2.59* 2.57* 2.71* 2.82*  CALCIUM 8.2* 9.1 8.3* 7.6* 7.6*  MG 1.8  --   --   --   --     Liver Function Tests: Recent Labs  Lab 04/23/22 1242 04/24/22 0541 04/26/22 0535  AST 10* 10* 12*  ALT $Re'8 10 9  'KUl$ ALKPHOS 80 66 71  BILITOT 0.9 1.0 0.9  PROT 7.2 5.9* 6.2*  ALBUMIN 3.8 2.8* 2.9*    CBG: Recent Labs  Lab 04/28/22 2015 04/29/22 0003 04/29/22 0415 04/29/22 0715 04/29/22 1135  GLUCAP 165* 168* 155* 132* 168*     Urinalysis    Component Value Date/Time  COLORURINE YELLOW 04/23/2022 [place] 04/23/2022 1411   LABSPEC 1.016 04/23/2022 1411   PHURINE 7.0 04/23/2022 1411   GLUCOSEU >1,000 (A) 04/23/2022 1411   HGBUR MODERATE (A) 04/23/2022 1411   BILIRUBINUR NEGATIVE 04/23/2022 1411   BILIRUBINUR negative 08/30/2020 1631   KETONESUR TRACE (A) 04/23/2022 1411   PROTEINUR >300 (A) 04/23/2022 1411   UROBILINOGEN 0.2 08/30/2020 1631   UROBILINOGEN 1.0 08/11/2008 1745   NITRITE NEGATIVE 04/23/2022 1411   LEUKOCYTESUR NEGATIVE 04/23/2022 1411      Time coordinating discharge: 35 minutes  SIGNED:  Vernell Leep, MD,  FACP, FHM, SFHM, [facility], [place]   To contact the  attending provider between 7A-7P or the covering provider during after hours 7P-7A, please log into the web site [email or web address] and access using universal [place] password for that web site. If you do not have the password, please call the hospital operator.

## 2022-04-29 NOTE — Progress Notes (Signed)
Verbal and written discharge AVS paperwork reviewed with [facility]. Pt able to teachback reasons to contact MD. Pt aware to pick up prescriptions at pharmacy. Wheelchair to lobby to DC home. Pt educated on signs and symptoms of infection to lapsites pt verbalized understandment.

## 2022-05-02 ENCOUNTER — Encounter (HOSPITAL_BASED_OUTPATIENT_CLINIC_OR_DEPARTMENT_OTHER): Payer: Self-pay

## 2022-05-02 DIAGNOSIS — R1110 Vomiting, unspecified: Secondary | ICD-10-CM | POA: Diagnosis not present

## 2022-05-02 DIAGNOSIS — Z79899 Other long term (current) drug therapy: Secondary | ICD-10-CM | POA: Insufficient documentation

## 2022-05-02 DIAGNOSIS — Z95810 Presence of automatic (implantable) cardiac defibrillator: Secondary | ICD-10-CM | POA: Diagnosis not present

## 2022-05-02 DIAGNOSIS — R000 Tachycardia, unspecified: Secondary | ICD-10-CM | POA: Diagnosis not present

## 2022-05-02 DIAGNOSIS — Z794 Long term (current) use of insulin: Secondary | ICD-10-CM | POA: Diagnosis not present

## 2022-05-02 DIAGNOSIS — N189 Chronic kidney disease, unspecified: Secondary | ICD-10-CM | POA: Diagnosis not present

## 2022-05-02 DIAGNOSIS — I509 Heart failure, unspecified: Secondary | ICD-10-CM | POA: Diagnosis not present

## 2022-05-02 DIAGNOSIS — E1022 Type 1 diabetes mellitus with diabetic chronic kidney disease: Secondary | ICD-10-CM | POA: Diagnosis not present

## 2022-05-02 DIAGNOSIS — E1043 Type 1 diabetes mellitus with diabetic autonomic (poly)neuropathy: Secondary | ICD-10-CM | POA: Insufficient documentation

## 2022-05-02 DIAGNOSIS — R10817 Generalized abdominal tenderness: Secondary | ICD-10-CM | POA: Diagnosis not present

## 2022-05-02 NOTE — ED Triage Notes (Signed)
Admitted to hospital recently dc'd Monday Dx gastroparesis Began vomiting on Tuesday Has Phenergan PO and Rectally andis not helping.

## 2022-05-03 ENCOUNTER — Emergency Department (HOSPITAL_BASED_OUTPATIENT_CLINIC_OR_DEPARTMENT_OTHER)
Admission: EM | Admit: 2022-05-03 | Discharge: 2022-05-03 | Disposition: A | Discharge status: Home / Self Care | Payer: BC Managed Care – PPO | Attending: Emergency Medicine | Admitting: Emergency Medicine

## 2022-05-03 DIAGNOSIS — E1142 Type 2 diabetes mellitus with diabetic polyneuropathy: Secondary | ICD-10-CM | POA: Diagnosis not present

## 2022-05-03 DIAGNOSIS — R112 Nausea with vomiting, unspecified: Secondary | ICD-10-CM

## 2022-05-03 DIAGNOSIS — I509 Heart failure, unspecified: Secondary | ICD-10-CM | POA: Diagnosis not present

## 2022-05-03 DIAGNOSIS — K3184 Gastroparesis: Secondary | ICD-10-CM | POA: Diagnosis not present

## 2022-05-03 HISTORY — DX: Gastroparesis: K31.84

## 2022-05-03 LAB — URINALYSIS, ROUTINE W REFLEX MICROSCOPIC
Bilirubin Urine: NEGATIVE
Glucose, UA: 250 mg/dL — AB
Ketones, ur: 15 mg/dL — AB
Leukocytes,Ua: NEGATIVE
Nitrite: NEGATIVE
Protein, ur: >300 mg/dL — AB
Specific Gravity, Urine: 1.023 (ref 1.005–1.030)
pH: 7 (ref 5.0–8.0)

## 2022-05-03 LAB — CBC WITH DIFFERENTIAL/PLATELET
Abs Immature Granulocytes: 0.02 10*3/uL (ref 0.00–0.07)
Basophils Absolute: 0 10*3/uL (ref 0.0–0.1)
Basophils Relative: 0 %
Eosinophils Absolute: 0 10*3/uL (ref 0.0–0.5)
Eosinophils Relative: 0 %
HCT: 32 % — ABNORMAL LOW (ref 36.0–46.0)
Hemoglobin: 10.3 g/dL — ABNORMAL LOW (ref 12.0–15.0)
Immature Granulocytes: 0 %
Lymphocytes Relative: 16 %
Lymphs Abs: 1 10*3/uL (ref 0.7–4.0)
MCH: 27.6 pg (ref 26.0–34.0)
MCHC: 32.2 g/dL (ref 30.0–36.0)
MCV: 85.8 fL (ref 80.0–100.0)
Monocytes Absolute: 0.3 10*3/uL (ref 0.1–1.0)
Monocytes Relative: 5 %
Neutro Abs: 4.6 10*3/uL (ref 1.7–7.7)
Neutrophils Relative %: 79 %
Platelets: 243 10*3/uL (ref 150–400)
RBC: 3.73 MIL/uL — ABNORMAL LOW (ref 3.87–5.11)
RDW: 13.7 % (ref 11.5–15.5)
WBC: 5.9 10*3/uL (ref 4.0–10.5)
nRBC: 0 % (ref 0.0–0.2)

## 2022-05-03 LAB — COMPREHENSIVE METABOLIC PANEL
ALT: 6 U/L (ref 0–44)
AST: 11 U/L — ABNORMAL LOW (ref 15–41)
Albumin: 3.5 g/dL (ref 3.5–5.0)
Alkaline Phosphatase: 74 U/L (ref 38–126)
Anion gap: 11 (ref 5–15)
BUN: 16 mg/dL (ref 6–20)
CO2: 26 mmol/L (ref 22–32)
Calcium: 9.1 mg/dL (ref 8.9–10.3)
Chloride: 107 mmol/L (ref 98–111)
Creatinine, Ser: 2.44 mg/dL — ABNORMAL HIGH (ref 0.44–1.00)
GFR, Estimated: 23 mL/min — ABNORMAL LOW (ref >60)
Glucose, Bld: 189 mg/dL — ABNORMAL HIGH (ref 70–99)
Potassium: 3.5 mmol/L (ref 3.5–5.1)
Sodium: 144 mmol/L (ref 135–145)
Total Bilirubin: 0.6 mg/dL (ref 0.3–1.2)
Total Protein: 7 g/dL (ref 6.5–8.1)

## 2022-05-03 LAB — TROPONIN I (HIGH SENSITIVITY)
Troponin I (High Sensitivity): 37 ng/L — ABNORMAL HIGH (ref <18)
Troponin I (High Sensitivity): 36 ng/L — ABNORMAL HIGH (ref <18)

## 2022-05-03 LAB — LIPASE, BLOOD: Lipase: <10 U/L — ABNORMAL LOW (ref 11–51)

## 2022-05-03 LAB — MAGNESIUM: Magnesium: 1.6 mg/dL — ABNORMAL LOW (ref 1.7–2.4)

## 2022-05-03 LAB — PREGNANCY, URINE: Preg Test, Ur: NEGATIVE

## 2022-05-03 MED ORDER — PANTOPRAZOLE SODIUM 40 MG IV SOLR
40.0000 mg | Freq: Once | INTRAVENOUS | Status: AC
  Administered 2022-05-03: 40 mg via INTRAVENOUS
  Filled 2022-05-03: qty 10

## 2022-05-03 MED ORDER — SODIUM CHLORIDE 0.9 % IV BOLUS
500.0000 mL | Freq: Once | INTRAVENOUS | Status: AC
  Administered 2022-05-03: 500 mL via INTRAVENOUS

## 2022-05-03 MED ORDER — PROMETHAZINE HCL 25 MG/ML IJ SOLN
INTRAMUSCULAR | Status: AC
  Administered 2022-05-03: 25 mg
  Filled 2022-05-03: qty 1

## 2022-05-03 MED ORDER — MORPHINE SULFATE (PF) 4 MG/ML IV SOLN
4.0000 mg | Freq: Once | INTRAVENOUS | Status: AC
  Administered 2022-05-03: 4 mg via INTRAVENOUS
  Filled 2022-05-03: qty 1

## 2022-05-03 MED ORDER — METOCLOPRAMIDE HCL 5 MG/ML IJ SOLN
10.0000 mg | Freq: Once | INTRAMUSCULAR | Status: AC
  Administered 2022-05-03: 10 mg via INTRAVENOUS
  Filled 2022-05-03: qty 2

## 2022-05-03 MED ORDER — LABETALOL HCL 5 MG/ML IV SOLN
10.0000 mg | Freq: Once | INTRAVENOUS | Status: DC
  Filled 2022-05-03: qty 4

## 2022-05-03 MED ORDER — MAGNESIUM SULFATE 2 GM/50ML IV SOLN
2.0000 g | Freq: Once | INTRAVENOUS | Status: AC
  Administered 2022-05-03: 2 g via INTRAVENOUS
  Filled 2022-05-03: qty 50

## 2022-05-03 MED ORDER — SODIUM CHLORIDE 0.9 % IV SOLN
12.5000 mg | INTRAVENOUS | Status: DC | PRN
  Filled 2022-05-03: qty 0.5

## 2022-05-03 NOTE — ED Notes (Signed)
Dr. Ralene Bathe updated on pt c/o chest presure and has read EKG - provider to see this patient next

## 2022-05-03 NOTE — ED Notes (Addendum)
Pt having ongoing wretching with vomiting- noted small to moderate amount of coffee ground emesis with multiple episodes while this nurse at bedside.   Pt also reports stress incontinence with vomiting - pt changed out of personal clothes into hospital gown with nurse assistance; this nurse has also changed sheets and pillow case d/t emesis on linen.  While at bedside implantable device noted to L chest- pt reports MI 04/05/2022 with defibrillator placement 04/09/2022 - Dr. Ralene Bathe made aware- EKG completed 0155hrs - verbal order for trop to add on to labs  - pt now on continuous cardiac and pulse ox monitoring with frequent vital sign checks

## 2022-05-03 NOTE — ED Provider Notes (Signed)
[facility] EMERGENCY DEPT Provider Note   CSN: [id number] Arrival date & time: 05/02/22  2237     History  Chief Complaint  Patient presents with   Emesis    [name] is a 51 y.o. female.  The history is provided by the patient and medical records.  Emesis [name] is a 51 y.o. female who presents to the Emergency Department complaining of gastroparesis.  She presents to the ED for what she describes as gastroparesis.  Sxs started on Tuesday.  She has been vomiting, gagging, dry heaving and have abdominal pain.  She is vomiting greater than 20 times a day.  Started vomiting what appears to be blood/coffee grounds every time she vomits yesterday.  AP is central, upper.  Pain is constant.  She has a hx/o recurrent gastroparesis and in and out of the hospital for the last two months.  Hx/o hematemesis in the past.    Sees [facility].   Admitted to the hospital recently for one week and discharged home on Monday.  She states the pharmacist refused to fill one medication due to concern for complication.  Possibly reglan?  Tried her home phenergan suppository without relief.    No fevers, feels cold.  No diarrhea.    Has not been able to eat or drink since Monday.   Last dose of home meds Monday.    No tobacco, alcohol or street drugs    Home Medications Prior to Admission medications   Medication Sig Start Date End Date Taking? Authorizing Provider  acetaminophen (TYLENOL) 500 MG tablet Take 1,000 mg by mouth 3 (three) times daily as needed for moderate pain.    [provider]  albuterol (PROAIR HFA) 108 (90 Base) MCG/ACT inhaler Inhale 2 puffs into the lungs every 6 (six) hours as needed for wheezing or shortness of breath. 07/03/20   [name], PA-C  Carboxymethylcellul-Glycerin (LUBRICATING EYE DROPS OP) Place 1-2 drops into both eyes daily as needed (dry eyes).    [provider]  Continuous Blood Gluc Sensor (FREESTYLE LIBRE  2 SENSOR) MISC Inject 1 Device into the skin every 14 (fourteen) days. 03/20/20   [provider]  Ensure Max Protein (ENSURE MAX PROTEIN) LIQD Take 330 mLs (11 oz total) by mouth daily. 04/30/22   Hongalgi, [name], MD  Insulin Disposable Pump (OMNIPOD 10 PACK) MISC by Does not apply route.    [provider]  isosorbide-hydrALAZINE (BIDIL) 20-37.5 MG tablet Take 1 tablet by mouth 3 (three) times daily. 04/11/22 05/11/22  Darliss Cheney, MD  metoCLOPramide (REGLAN) 10 MG tablet Take 1 tablet (10 mg total) by mouth 3 (three) times daily before meals. 04/29/22   Hongalgi, [name], MD  metoprolol succinate (TOPROL XL) 25 MG 24 hr tablet Take 1 tablet (25 mg total) by mouth daily. 04/19/22   [name], PA-C  Multiple Vitamin (MULTIVITAMIN WITH MINERALS) TABS tablet Take 1 tablet by mouth daily. 04/30/22   Hongalgi, [name], MD  NOVOLOG 100 UNIT/ML injection Inject 0-120 Units into the skin daily. Per sliding scale per patient. Patient states it is an insulin pump 04/06/20   [provider]  pantoprazole (PROTONIX) 40 MG tablet Take 1 tablet (40 mg total) by mouth 2 (two) times daily before a meal. 04/29/22 06/28/22  Hongalgi, [name], MD  polyethylene glycol (MIRALAX / GLYCOLAX) 17 g packet Take 17 g by mouth daily as needed for moderate constipation.    [provider]  pregabalin (LYRICA)  50 MG capsule Take 50 mg by mouth 2 (two) times daily as needed (neuropathy). 12/05/21   [provider]  prochlorperazine (COMPAZINE) 10 MG tablet Take 1 tablet (10 mg total) by mouth every 8 (eight) hours as needed for nausea or vomiting. 04/29/22   Hongalgi, [name], MD  rosuvastatin (CRESTOR) 20 MG tablet Take 1 tablet (20 mg total) by mouth daily. 04/12/22 05/12/22  Darliss Cheney, MD  tobramycin (TOBREX) 0.3 % ophthalmic solution Place 1-2 drops into both eyes See admin instructions. Instill 1-2 drops into both eyes 3 times daily the day before, day of, and the day after  every 6 weeks eye injections per patient 11/17/20   [provider]  torsemide (DEMADEX) 20 MG tablet Take 20 mg by mouth daily as needed (swelling). 12/25/21   [provider]  TRESIBA FLEXTOUCH 200 UNIT/ML FlexTouch Pen Inject 50 Units into the skin daily as needed (when insulin pump stops working). 11/04/21   [provider]      Allergies    Atorvastatin, Dulaglutide, Zofran [ondansetron], Amoxicillin, Lantus [insulin glargine], and Miconazole nitrate    Review of Systems   Review of Systems  Gastrointestinal:  Positive for vomiting.  All other systems reviewed and are negative.   Physical Exam Updated Vital Signs BP 128/82   Pulse 93   Temp 98.2 F (36.8 C) (Oral)   Resp (!) 22   Ht 6' (1.829 m)   Wt 72.6 kg   LMP 11/17/2021 (Approximate)   SpO2 93%   BMI 21.70 kg/m  Physical Exam Vitals and nursing note reviewed.  Constitutional:      Appearance: She is well-developed.     Comments: Appears uncomfortable.   HENT:     Head: Normocephalic and atraumatic.  Cardiovascular:     Rate and Rhythm: Regular rhythm. Tachycardia present.  Pulmonary:     Effort: Pulmonary effort is normal. No respiratory distress.  Abdominal:     Palpations: Abdomen is soft.     Tenderness: There is no guarding or rebound.     Comments: Mild generalized abdominal tenderness  Musculoskeletal:        General: No swelling or tenderness.  Skin:    General: Skin is warm and dry.  Neurological:     Mental Status: She is alert and oriented to person, place, and time.  Psychiatric:        Behavior: Behavior normal.     ED Results / Procedures / Treatments   Labs (all labs ordered are listed, but only abnormal results are displayed) Labs Reviewed  LIPASE, BLOOD - Abnormal; Notable for the following components:      Result Value   Lipase <10 (*)    All other components within normal limits  COMPREHENSIVE METABOLIC PANEL - Abnormal; Notable for the following  components:   Glucose, Bld 189 (*)    Creatinine, Ser 2.44 (*)    AST 11 (*)    GFR, Estimated 23 (*)    All other components within normal limits  CBC WITH DIFFERENTIAL/PLATELET - Abnormal; Notable for the following components:   RBC 3.73 (*)    Hemoglobin 10.3 (*)    HCT 32.0 (*)    All other components within normal limits  URINALYSIS, ROUTINE W REFLEX MICROSCOPIC - Abnormal; Notable for the following components:   Glucose, UA 250 (*)    Hgb urine dipstick MODERATE (*)    Ketones, ur 15 (*)    Protein, ur >300 (*)    All  other components within normal limits  MAGNESIUM - Abnormal; Notable for the following components:   Magnesium 1.6 (*)    All other components within normal limits  TROPONIN I (HIGH SENSITIVITY) - Abnormal; Notable for the following components:   Troponin I (High Sensitivity) 37 (*)    All other components within normal limits  TROPONIN I (HIGH SENSITIVITY) - Abnormal; Notable for the following components:   Troponin I (High Sensitivity) 36 (*)    All other components within normal limits  PREGNANCY, URINE    EKG EKG Interpretation  Date/Time:  Friday May 03 2022 03:07:09 EDT Ventricular Rate:  113 PR Interval:  132 QRS Duration: 85 QT Interval:  326 QTC Calculation: 447 R Axis:   76 Text Interpretation: Sinus tachycardia Borderline repolarization abnormality Confirmed by Quintella Reichert [phone]) on 05/03/2022 3:14:47 AM  Radiology No results found.  Procedures Procedures    Medications Ordered in ED Medications  promethazine (PHENERGAN) 12.5 mg in sodium chloride 0.9 % 50 mL IVPB (has no administration in time range)  metoCLOPramide (REGLAN) injection 10 mg (10 mg Intravenous Given 05/03/22 0322)  sodium chloride 0.9 % bolus 500 mL (0 mLs Intravenous Stopped 05/03/22 0539)  morphine (PF) 4 MG/ML injection 4 mg (4 mg Intravenous Given 05/03/22 0415)  pantoprazole (PROTONIX) injection 40 mg (40 mg Intravenous Given 05/03/22 0422)  promethazine  (PHENERGAN) 25 MG/ML injection (25 mg  Given 05/03/22 0415)  magnesium sulfate IVPB 2 g 50 mL (2 g Intravenous New Bag/Given 05/03/22 0541)    ED Course/ Medical Decision Making/ A&P                           Medical Decision Making Amount and/or Complexity of Data Reviewed Labs: ordered.  Risk Prescription drug management.   Pt with hx/o IDDM, CKD, gastroparesis, CHF, V-fib arrest status post ICD placement here for evaluation of recurrent vomiting similar to prior gastroparesis flares.  Patient uncomfortable appearing with frequent vomiting on initial assessment.  She was treated with antiemetic, pain medication, PPI and IV fluids.  On repeat evaluation patient feeling significantly improved.  Labs with improving renal function when compared to priors.  She has 2 borderline elevated but flat troponins.  She complains of chest discomfort that has been present since her cardiac event on September 1.  Hemoglobin improved compared to priors.  Patient able to tolerate oral fluids in the emergency department.  If she can continue to keep down fluids feel she is stable for discharge home with close outpatient follow-up.  If she develops recurrent nausea and vomiting may consider admission.  Patient care transferred pending reevaluation.        Final Clinical Impression(s) / ED Diagnoses Final diagnoses:  Nausea and vomiting, unspecified vomiting type  Hypomagnesemia    Rx / DC Orders ED Discharge Orders     None         Quintella Reichert, MD 05/03/22 [phone]

## 2022-05-03 NOTE — ED Notes (Signed)
Pt able to hold down fluids and stated that she is doing well and is ready to go home

## 2022-05-03 NOTE — ED Notes (Signed)
While at bedside pt reporting needing to urinate-- pt agreeable to purewick -- showing small amount of urine on incontinence pad while placing purewick -- purewick placement well tolerated by pt.  Pt continues to have frequent wretching and emesis episodes with scant to small amounts of coffee ground emesis.  During one episode while nurse at bedside pt begins to c/o centralized cp described as pressure-- repeat EKG obtained at this time.

## 2022-05-07 DIAGNOSIS — E113513 Type 2 diabetes mellitus with proliferative diabetic retinopathy with macular edema, bilateral: Secondary | ICD-10-CM | POA: Diagnosis not present

## 2022-05-07 DIAGNOSIS — H35371 Puckering of macula, right eye: Secondary | ICD-10-CM | POA: Diagnosis not present

## 2022-05-07 DIAGNOSIS — H43823 Vitreomacular adhesion, bilateral: Secondary | ICD-10-CM | POA: Diagnosis not present

## 2022-05-07 DIAGNOSIS — H43812 Vitreous degeneration, left eye: Secondary | ICD-10-CM | POA: Diagnosis not present

## 2022-05-12 ENCOUNTER — Observation Stay (HOSPITAL_COMMUNITY)
Admission: EM | Admit: 2022-05-12 | Discharge: 2022-05-14 | Disposition: A | Discharge status: Home / Self Care | Payer: BC Managed Care – PPO | Attending: Family Medicine | Admitting: Family Medicine

## 2022-05-12 DIAGNOSIS — E10621 Type 1 diabetes mellitus with foot ulcer: Secondary | ICD-10-CM | POA: Insufficient documentation

## 2022-05-12 DIAGNOSIS — Z95810 Presence of automatic (implantable) cardiac defibrillator: Secondary | ICD-10-CM | POA: Diagnosis not present

## 2022-05-12 DIAGNOSIS — L97529 Non-pressure chronic ulcer of other part of left foot with unspecified severity: Secondary | ICD-10-CM | POA: Insufficient documentation

## 2022-05-12 DIAGNOSIS — Z79899 Other long term (current) drug therapy: Secondary | ICD-10-CM | POA: Diagnosis not present

## 2022-05-12 DIAGNOSIS — E1040 Type 1 diabetes mellitus with diabetic neuropathy, unspecified: Secondary | ICD-10-CM | POA: Diagnosis present

## 2022-05-12 DIAGNOSIS — K920 Hematemesis: Secondary | ICD-10-CM | POA: Diagnosis present

## 2022-05-12 DIAGNOSIS — I130 Hypertensive heart and chronic kidney disease with heart failure and stage 1 through stage 4 chronic kidney disease, or unspecified chronic kidney disease: Secondary | ICD-10-CM | POA: Insufficient documentation

## 2022-05-12 DIAGNOSIS — I5032 Chronic diastolic (congestive) heart failure: Secondary | ICD-10-CM | POA: Diagnosis present

## 2022-05-12 DIAGNOSIS — L97919 Non-pressure chronic ulcer of unspecified part of right lower leg with unspecified severity: Secondary | ICD-10-CM | POA: Insufficient documentation

## 2022-05-12 DIAGNOSIS — Z794 Long term (current) use of insulin: Secondary | ICD-10-CM | POA: Insufficient documentation

## 2022-05-12 DIAGNOSIS — L97929 Non-pressure chronic ulcer of unspecified part of left lower leg with unspecified severity: Secondary | ICD-10-CM | POA: Insufficient documentation

## 2022-05-12 DIAGNOSIS — R112 Nausea with vomiting, unspecified: Secondary | ICD-10-CM | POA: Diagnosis not present

## 2022-05-12 DIAGNOSIS — N1832 Chronic kidney disease, stage 3b: Secondary | ICD-10-CM | POA: Diagnosis present

## 2022-05-12 DIAGNOSIS — N184 Chronic kidney disease, stage 4 (severe): Secondary | ICD-10-CM | POA: Diagnosis not present

## 2022-05-12 DIAGNOSIS — E1043 Type 1 diabetes mellitus with diabetic autonomic (poly)neuropathy: Secondary | ICD-10-CM | POA: Diagnosis not present

## 2022-05-12 DIAGNOSIS — E1143 Type 2 diabetes mellitus with diabetic autonomic (poly)neuropathy: Secondary | ICD-10-CM | POA: Diagnosis present

## 2022-05-12 DIAGNOSIS — J45909 Unspecified asthma, uncomplicated: Secondary | ICD-10-CM | POA: Insufficient documentation

## 2022-05-12 DIAGNOSIS — K3184 Gastroparesis: Secondary | ICD-10-CM | POA: Diagnosis not present

## 2022-05-12 DIAGNOSIS — R1084 Generalized abdominal pain: Secondary | ICD-10-CM | POA: Diagnosis not present

## 2022-05-12 DIAGNOSIS — R110 Nausea: Secondary | ICD-10-CM | POA: Diagnosis not present

## 2022-05-12 DIAGNOSIS — I10 Essential (primary) hypertension: Secondary | ICD-10-CM | POA: Diagnosis not present

## 2022-05-12 DIAGNOSIS — I214 Non-ST elevation (NSTEMI) myocardial infarction: Secondary | ICD-10-CM

## 2022-05-12 DIAGNOSIS — I469 Cardiac arrest, cause unspecified: Secondary | ICD-10-CM | POA: Diagnosis present

## 2022-05-12 DIAGNOSIS — E1069 Type 1 diabetes mellitus with other specified complication: Secondary | ICD-10-CM | POA: Diagnosis present

## 2022-05-12 DIAGNOSIS — R000 Tachycardia, unspecified: Secondary | ICD-10-CM | POA: Diagnosis not present

## 2022-05-12 MED ORDER — SODIUM CHLORIDE 0.9 % IV SOLN
12.5000 mg | Freq: Four times a day (QID) | INTRAVENOUS | Status: DC | PRN
  Administered 2022-05-12: 12.5 mg via INTRAVENOUS
  Filled 2022-05-12: qty 12.5

## 2022-05-12 MED ORDER — PROCHLORPERAZINE EDISYLATE 10 MG/2ML IJ SOLN
INTRAMUSCULAR | Status: AC
  Filled 2022-05-12: qty 2

## 2022-05-12 MED ORDER — MORPHINE SULFATE (PF) 4 MG/ML IV SOLN
4.0000 mg | Freq: Once | INTRAVENOUS | Status: AC
  Administered 2022-05-13: 4 mg via INTRAVENOUS
  Filled 2022-05-12: qty 1

## 2022-05-12 MED ORDER — METOCLOPRAMIDE HCL 5 MG/ML IJ SOLN
10.0000 mg | Freq: Once | INTRAMUSCULAR | Status: AC
  Administered 2022-05-12: 10 mg via INTRAVENOUS
  Filled 2022-05-12: qty 2

## 2022-05-12 MED ORDER — SODIUM CHLORIDE 0.9 % IV BOLUS
1000.0000 mL | Freq: Once | INTRAVENOUS | Status: AC
  Administered 2022-05-12: 1000 mL via INTRAVENOUS

## 2022-05-12 MED ORDER — LORAZEPAM 2 MG/ML IJ SOLN
1.0000 mg | Freq: Once | INTRAMUSCULAR | Status: AC
  Administered 2022-05-13: 1 mg via INTRAVENOUS
  Filled 2022-05-12: qty 1

## 2022-05-12 NOTE — ED Provider Notes (Signed)
[facility] DEPT Provider Note   CSN: [id number] Arrival date & time: 05/12/22  2211     History  Chief Complaint  Patient presents with   Nausea and vomiting     [name] is a 51 y.o. female.  Patient is a 52 year old female with past medical history of type 1 diabetes, diabetic gastroparesis, congestive heart failure with defibrillator, chronic renal insufficiency.  Patient presenting today with complaints of nausea vomiting.  This has been ongoing for the past 4 days.  Over the past 2 months, patient has been in and out of the hospital on multiple occasions with similar episodes.  She has seen [facility] for this, however no treatment has been successful.  She has been trying Phenergan and Reglan at home with little relief.  She describes little p.o. intake over the past 4 days.  She denies fevers or chills.  She denies bloody stool or vomit.  The history is provided by the patient.       Home Medications Prior to Admission medications   Medication Sig Start Date End Date Taking? Authorizing Provider  acetaminophen (TYLENOL) 500 MG tablet Take 1,000 mg by mouth 3 (three) times daily as needed for moderate pain.    [provider]  albuterol (PROAIR HFA) 108 (90 Base) MCG/ACT inhaler Inhale 2 puffs into the lungs every 6 (six) hours as needed for wheezing or shortness of breath. 07/03/20   [name], PA-C  Carboxymethylcellul-Glycerin (LUBRICATING EYE DROPS OP) Place 1-2 drops into both eyes daily as needed (dry eyes).    [provider]  Continuous Blood Gluc Sensor (FREESTYLE LIBRE 2 SENSOR) MISC Inject 1 Device into the skin every 14 (fourteen) days. 03/20/20   [provider]  Ensure Max Protein (ENSURE MAX PROTEIN) LIQD Take 330 mLs (11 oz total) by mouth daily. 04/30/22   Hongalgi, [name], MD  Insulin Disposable Pump (OMNIPOD 10 PACK) MISC by Does not apply route.    [provider]   LORazepam (ATIVAN) 0.5 MG tablet Take 0.5 mg by mouth daily as needed. 05/11/22   [provider]  metoCLOPramide (REGLAN) 10 MG tablet Take 1 tablet (10 mg total) by mouth 3 (three) times daily before meals. 04/29/22   Hongalgi, [name], MD  metoprolol succinate (TOPROL XL) 25 MG 24 hr tablet Take 1 tablet (25 mg total) by mouth daily. 04/19/22   [name], PA-C  Multiple Vitamin (MULTIVITAMIN WITH MINERALS) TABS tablet Take 1 tablet by mouth daily. 04/30/22   Hongalgi, [name], MD  NOVOLOG 100 UNIT/ML injection Inject 0-120 Units into the skin daily. Per sliding scale per patient. Patient states it is an insulin pump 04/06/20   [provider]  pantoprazole (PROTONIX) 40 MG tablet Take 1 tablet (40 mg total) by mouth 2 (two) times daily before a meal. 04/29/22 06/28/22  Hongalgi, [name], MD  polyethylene glycol (MIRALAX / GLYCOLAX) 17 g packet Take 17 g by mouth daily as needed for moderate constipation.    [provider]  pregabalin (LYRICA) 50 MG capsule Take 50 mg by mouth 2 (two) times daily as needed (neuropathy). 12/05/21   [provider]  prochlorperazine (COMPAZINE) 10 MG tablet Take 1 tablet (10 mg total) by mouth every 8 (eight) hours as needed for nausea or vomiting. 04/29/22   Hongalgi, [name], MD  rosuvastatin (CRESTOR) 20 MG tablet Take 1 tablet (20 mg total) by mouth daily. 04/12/22 05/12/22  Darliss Cheney, MD  tobramycin (TOBREX) 0.3 % ophthalmic solution Place 1-2 drops into both eyes See admin instructions. Instill 1-2 drops into both eyes 3 times daily the day before, day of, and the day after every 6 weeks eye injections per patient 11/17/20   [provider]  torsemide (DEMADEX) 20 MG tablet Take 20 mg by mouth daily as needed (swelling). 12/25/21   [provider]  TRESIBA FLEXTOUCH 200 UNIT/ML FlexTouch Pen Inject 50 Units into the skin daily as needed (when insulin pump stops working). 11/04/21   [provider]       Allergies    Atorvastatin, Dulaglutide, Zofran [ondansetron], Amoxicillin, Lantus [insulin glargine], and Miconazole nitrate    Review of Systems   Review of Systems  All other systems reviewed and are negative.   Physical Exam Updated Vital Signs BP (!) 189/114 (BP Location: Left Arm)   Pulse (!) 111   Temp 98.8 F (37.1 C) (Oral)   Resp 18   LMP 11/17/2021 (Approximate)   SpO2 100%  Physical Exam Vitals and nursing note reviewed.  Constitutional:      General: She is not in acute distress.    Appearance: She is well-developed. She is not diaphoretic.     Comments: Patient is a 51 year old female who appears uncomfortable.  She is actively retching/vomiting.  HENT:     Head: Normocephalic and atraumatic.  Cardiovascular:     Rate and Rhythm: Normal rate and regular rhythm.     Heart sounds: No murmur heard.    No friction rub. No gallop.  Pulmonary:     Effort: Pulmonary effort is normal. No respiratory distress.     Breath sounds: Normal breath sounds. No wheezing.  Abdominal:     General: Bowel sounds are normal. There is no distension.     Palpations: Abdomen is soft.     Tenderness: There is no abdominal tenderness.  Musculoskeletal:        General: Normal range of motion.     Cervical back: Normal range of motion and neck supple.  Skin:    General: Skin is warm and dry.  Neurological:     General: No focal deficit present.     Mental Status: She is alert and oriented to person, place, and time.     ED Results / Procedures / Treatments   Labs (all labs ordered are listed, but only abnormal results are displayed) Labs Reviewed  CBC WITH DIFFERENTIAL/PLATELET  COMPREHENSIVE METABOLIC PANEL  LIPASE, BLOOD  URINALYSIS, ROUTINE W REFLEX MICROSCOPIC    EKG None  Radiology No results found.  Procedures Procedures  {Document cardiac monitor, telemetry assessment procedure when appropriate:1}  Medications Ordered in ED Medications  sodium  chloride 0.9 % bolus 1,000 mL (has no administration in time range)  promethazine (PHENERGAN) 12.5 mg in sodium chloride 0.9 % 50 mL IVPB (has no administration in time range)  metoCLOPramide (REGLAN) injection 10 mg (has no administration in time range)    ED Course/ Medical Decision Making/ A&P                           Medical Decision Making Risk Prescription drug management.   ***  {Document critical care time when appropriate:1} {Document review of labs and clinical decision tools ie heart score, Chads2Vasc2 etc:1}  {Document your independent review of radiology images, and any outside records:1} {Document your discussion with family members, caretakers, and with consultants:1} {Document social determinants of health affecting pt's  care:1} {Document your decision making why or why not admission, treatments were needed:1} Final Clinical Impression(s) / ED Diagnoses Final diagnoses:  None    Rx / DC Orders ED Discharge Orders     None

## 2022-05-12 NOTE — ED Triage Notes (Signed)
Patient BIB EMS for nausea and vomiting since Thursday . Patient has h/x of gastroparesis , MI (defib in place) 04/05/22, and diabetes . Patient is not actively vomiting , dry heaving. Patient has and IV to the right forearm inserted by EMS 20g. Per EMS patient received 519ml of fluids .

## 2022-05-13 ENCOUNTER — Encounter (HOSPITAL_COMMUNITY): Payer: Self-pay | Admitting: Family Medicine

## 2022-05-13 ENCOUNTER — Other Ambulatory Visit: Payer: Self-pay

## 2022-05-13 DIAGNOSIS — K920 Hematemesis: Secondary | ICD-10-CM | POA: Diagnosis not present

## 2022-05-13 DIAGNOSIS — K3184 Gastroparesis: Secondary | ICD-10-CM | POA: Diagnosis present

## 2022-05-13 DIAGNOSIS — E1143 Type 2 diabetes mellitus with diabetic autonomic (poly)neuropathy: Secondary | ICD-10-CM

## 2022-05-13 DIAGNOSIS — I5032 Chronic diastolic (congestive) heart failure: Secondary | ICD-10-CM

## 2022-05-13 DIAGNOSIS — I10 Essential (primary) hypertension: Secondary | ICD-10-CM

## 2022-05-13 DIAGNOSIS — I214 Non-ST elevation (NSTEMI) myocardial infarction: Secondary | ICD-10-CM

## 2022-05-13 HISTORY — DX: Non-ST elevation (NSTEMI) myocardial infarction: I21.4

## 2022-05-13 LAB — CBG MONITORING, ED
Glucose-Capillary: 104 mg/dL — ABNORMAL HIGH (ref 70–99)
Glucose-Capillary: 62 mg/dL — ABNORMAL LOW (ref 70–99)
Glucose-Capillary: 75 mg/dL (ref 70–99)
Glucose-Capillary: 85 mg/dL (ref 70–99)

## 2022-05-13 LAB — COMPREHENSIVE METABOLIC PANEL
ALT: 9 U/L (ref 0–44)
ALT: 11 U/L (ref 0–44)
AST: 18 U/L (ref 15–41)
AST: 15 U/L (ref 15–41)
Albumin: 2.8 g/dL — ABNORMAL LOW (ref 3.5–5.0)
Albumin: 2.6 g/dL — ABNORMAL LOW (ref 3.5–5.0)
Alkaline Phosphatase: 65 U/L (ref 38–126)
Alkaline Phosphatase: 67 U/L (ref 38–126)
Anion gap: 8 (ref 5–15)
Anion gap: 5 (ref 5–15)
BUN: 25 mg/dL — ABNORMAL HIGH (ref 6–20)
BUN: 25 mg/dL — ABNORMAL HIGH (ref 6–20)
CO2: 25 mmol/L (ref 22–32)
CO2: 28 mmol/L (ref 22–32)
Calcium: 8.5 mg/dL — ABNORMAL LOW (ref 8.9–10.3)
Calcium: 8.4 mg/dL — ABNORMAL LOW (ref 8.9–10.3)
Chloride: 110 mmol/L (ref 98–111)
Chloride: 114 mmol/L — ABNORMAL HIGH (ref 98–111)
Creatinine, Ser: 2.73 mg/dL — ABNORMAL HIGH (ref 0.44–1.00)
Creatinine, Ser: 2.8 mg/dL — ABNORMAL HIGH (ref 0.44–1.00)
GFR, Estimated: 20 mL/min — ABNORMAL LOW (ref >60)
GFR, Estimated: 20 mL/min — ABNORMAL LOW (ref >60)
Glucose, Bld: 185 mg/dL — ABNORMAL HIGH (ref 70–99)
Glucose, Bld: 69 mg/dL — ABNORMAL LOW (ref 70–99)
Potassium: 4.2 mmol/L (ref 3.5–5.1)
Potassium: 3.5 mmol/L (ref 3.5–5.1)
Sodium: 143 mmol/L (ref 135–145)
Sodium: 147 mmol/L — ABNORMAL HIGH (ref 135–145)
Total Bilirubin: 1.2 mg/dL (ref 0.3–1.2)
Total Bilirubin: 0.9 mg/dL (ref 0.3–1.2)
Total Protein: 6.3 g/dL — ABNORMAL LOW (ref 6.5–8.1)
Total Protein: 6 g/dL — ABNORMAL LOW (ref 6.5–8.1)

## 2022-05-13 LAB — URINALYSIS, ROUTINE W REFLEX MICROSCOPIC
Bilirubin Urine: NEGATIVE
Glucose, UA: 150 mg/dL — AB
Hgb urine dipstick: NEGATIVE
Ketones, ur: NEGATIVE mg/dL
Leukocytes,Ua: NEGATIVE
Nitrite: NEGATIVE
Protein, ur: >=300 mg/dL — AB
Specific Gravity, Urine: 1.016 (ref 1.005–1.030)
pH: 7 (ref 5.0–8.0)

## 2022-05-13 LAB — CBC WITH DIFFERENTIAL/PLATELET
Abs Immature Granulocytes: 0.09 10*3/uL — ABNORMAL HIGH (ref 0.00–0.07)
Basophils Absolute: 0 10*3/uL (ref 0.0–0.1)
Basophils Relative: 1 %
Eosinophils Absolute: 0 10*3/uL (ref 0.0–0.5)
Eosinophils Relative: 0 %
HCT: 29.2 % — ABNORMAL LOW (ref 36.0–46.0)
Hemoglobin: 9.6 g/dL — ABNORMAL LOW (ref 12.0–15.0)
Immature Granulocytes: 1 %
Lymphocytes Relative: 18 %
Lymphs Abs: 1.5 10*3/uL (ref 0.7–4.0)
MCH: 28.2 pg (ref 26.0–34.0)
MCHC: 32.9 g/dL (ref 30.0–36.0)
MCV: 85.6 fL (ref 80.0–100.0)
Monocytes Absolute: 0.3 10*3/uL (ref 0.1–1.0)
Monocytes Relative: 4 %
Neutro Abs: 6.3 10*3/uL (ref 1.7–7.7)
Neutrophils Relative %: 76 %
Platelets: 214 10*3/uL (ref 150–400)
RBC: 3.41 MIL/uL — ABNORMAL LOW (ref 3.87–5.11)
RDW: 14.2 % (ref 11.5–15.5)
WBC: 8.2 10*3/uL (ref 4.0–10.5)
nRBC: 0 % (ref 0.0–0.2)

## 2022-05-13 LAB — TROPONIN I (HIGH SENSITIVITY)
Troponin I (High Sensitivity): 49 ng/L — ABNORMAL HIGH (ref <18)
Troponin I (High Sensitivity): 59 ng/L — ABNORMAL HIGH (ref <18)
Troponin I (High Sensitivity): 48 ng/L — ABNORMAL HIGH (ref <18)

## 2022-05-13 LAB — GLUCOSE, CAPILLARY
Glucose-Capillary: 50 mg/dL — ABNORMAL LOW (ref 70–99)
Glucose-Capillary: 94 mg/dL (ref 70–99)
Glucose-Capillary: 229 mg/dL — ABNORMAL HIGH (ref 70–99)

## 2022-05-13 LAB — CBC
HCT: 29.1 % — ABNORMAL LOW (ref 36.0–46.0)
Hemoglobin: 9.1 g/dL — ABNORMAL LOW (ref 12.0–15.0)
MCH: 27.7 pg (ref 26.0–34.0)
MCHC: 31.3 g/dL (ref 30.0–36.0)
MCV: 88.7 fL (ref 80.0–100.0)
Platelets: 217 10*3/uL (ref 150–400)
RBC: 3.28 MIL/uL — ABNORMAL LOW (ref 3.87–5.11)
RDW: 14.4 % (ref 11.5–15.5)
WBC: 7.6 10*3/uL (ref 4.0–10.5)
nRBC: 0 % (ref 0.0–0.2)

## 2022-05-13 LAB — LIPASE, BLOOD: Lipase: 22 U/L (ref 11–51)

## 2022-05-13 MED ORDER — ENSURE MAX PROTEIN PO LIQD
11.0000 [oz_av] | Freq: Every day | ORAL | Status: DC
  Filled 2022-05-13 (×2): qty 330

## 2022-05-13 MED ORDER — METOPROLOL SUCCINATE ER 25 MG PO TB24
25.0000 mg | ORAL_TABLET | Freq: Every day | ORAL | Status: DC
  Administered 2022-05-13 – 2022-05-14 (×2): 25 mg via ORAL
  Filled 2022-05-13 (×2): qty 1

## 2022-05-13 MED ORDER — SODIUM CHLORIDE 0.9 % IV SOLN: INTRAVENOUS | Status: DC

## 2022-05-13 MED ORDER — METOCLOPRAMIDE HCL 5 MG/ML IJ SOLN
10.0000 mg | Freq: Three times a day (TID) | INTRAMUSCULAR | Status: DC
  Administered 2022-05-13 – 2022-05-14 (×3): 10 mg via INTRAVENOUS
  Filled 2022-05-13 (×2): qty 2

## 2022-05-13 MED ORDER — PANTOPRAZOLE SODIUM 40 MG IV SOLR
40.0000 mg | Freq: Two times a day (BID) | INTRAVENOUS | Status: DC
  Administered 2022-05-13 – 2022-05-14 (×4): 40 mg via INTRAVENOUS
  Filled 2022-05-13 (×4): qty 10

## 2022-05-13 MED ORDER — PANTOPRAZOLE SODIUM 40 MG PO TBEC: 40.0000 mg | DELAYED_RELEASE_TABLET | Freq: Two times a day (BID) | ORAL | Status: DC

## 2022-05-13 MED ORDER — INSULIN ASPART 100 UNIT/ML IJ SOLN
0.0000 [IU] | INTRAMUSCULAR | Status: DC
  Administered 2022-05-13: 3 [IU] via SUBCUTANEOUS
  Administered 2022-05-14: 1 [IU] via SUBCUTANEOUS
  Administered 2022-05-14: 2 [IU] via SUBCUTANEOUS
  Administered 2022-05-14: 3 [IU] via SUBCUTANEOUS
  Filled 2022-05-13: qty 0.09

## 2022-05-13 MED ORDER — ALBUTEROL SULFATE (2.5 MG/3ML) 0.083% IN NEBU: 3.0000 mL | INHALATION_SOLUTION | Freq: Four times a day (QID) | RESPIRATORY_TRACT | Status: DC | PRN

## 2022-05-13 MED ORDER — ADULT MULTIVITAMIN W/MINERALS CH
1.0000 | ORAL_TABLET | Freq: Every day | ORAL | Status: DC
  Administered 2022-05-14: 1 via ORAL
  Filled 2022-05-13 (×2): qty 1

## 2022-05-13 MED ORDER — ENOXAPARIN SODIUM 40 MG/0.4ML IJ SOSY: 40.0000 mg | PREFILLED_SYRINGE | INTRAMUSCULAR | Status: DC

## 2022-05-13 MED ORDER — PROCHLORPERAZINE EDISYLATE 10 MG/2ML IJ SOLN
10.0000 mg | Freq: Four times a day (QID) | INTRAMUSCULAR | Status: DC | PRN
  Administered 2022-05-13 – 2022-05-14 (×3): 10 mg via INTRAVENOUS
  Filled 2022-05-13 (×3): qty 2

## 2022-05-13 NOTE — Assessment & Plan Note (Signed)
SSI

## 2022-05-13 NOTE — Assessment & Plan Note (Signed)
Troponin 40s on presentation with nonspecific chest pain in the setting of gastroparesis flare Appears to be recurring issue Suspect CKD likely confounding issue with poor renal clearance EKG with sinus tach and otherwise within normal limits We will cycle cardiac enzymes overnight Hold antiplatelet treatment in the setting of coffee-ground emesis Follow Cardiology consult as clinically appropriate

## 2022-05-13 NOTE — Plan of Care (Signed)

## 2022-05-13 NOTE — Assessment & Plan Note (Addendum)
Acute flare on presentation  Noted recent GI evaluation including:  EGD 8/29: Grade a reflux esophagitis without bleeding. GES 04/01/2019: Normal Will place patient on regimen at discharge including Reglan, Compazine, Protonix  Follow

## 2022-05-13 NOTE — Assessment & Plan Note (Signed)
Patient self reports coffee-ground emesis in the setting of gastroparesis flare Noted EGD August 29 with esophagitis without bleeding Hemoglobin appears relatively stable between 9.6 and 10.3 over the past 1 to 2 weeks. Improved from prior admission We will monitor for now IV PPI Hold anticoagulation GI consult/reevaluation as clinically indicated

## 2022-05-13 NOTE — Assessment & Plan Note (Signed)
Clinically dry  Gentle IVF hydration  Monitor volume status  Strict Is and Os and daily weights

## 2022-05-13 NOTE — Assessment & Plan Note (Addendum)
Recurring issue in setting of diabetic gastroparesis with admission 9/19-9/25 for similar issues  IVF NPO Will place patient on regimen at discharge including Reglan, Compazine, Protonix

## 2022-05-13 NOTE — Care Plan (Signed)
This 51 years old female with PMH significant for type 1 diabetes with insulin pump, complicated by diabetic neuropathy and gastroparesis, CKD stage IV, chronic diastolic CHF, hypertension, asthma, anxiety, depression, recent admissions in August and September for intractable nausea and vomiting with gastroparesis and hematemesis presented with intractable nausea and vomiting.  EGD during prior hospitalization showed grade a esophagitis without active bleeding.  Patient denies any recent triggers.  She has been compliant with her home medications. Patient is admitted for intractable nausea and vomiting in the setting of diabetic gastroparesis.  Continue Reglan Compazine and Protonix.  H&H remains stable.  Continue IV pantoprazole.  Hold anticoagulation.  Renal functions at baseline.  Patient was seen and examined at bedside.  She reports nausea and vomiting is improving.  Troponin is trending down.

## 2022-05-13 NOTE — Assessment & Plan Note (Signed)
BP stable Titrate home regimen

## 2022-05-13 NOTE — H&P (Addendum)
History and Physical    Patient: [name] IWP:[id number] DOB: 16-May-1971 DOA: 05/12/2022 DOS: the patient was seen and examined on 05/13/2022 PCP: [name], PA  Patient coming from: Home  Chief Complaint:  Chief Complaint  Patient presents with   Nausea and vomiting    HPI: [name] is a 51 y.o. female with medical history significant of type 1 DM with insulin pump, complicated by diabetic neuropathy and gastroparesis, CKD 4, chronic diastolic CHF, HTN, asthma, anxiety and depression, recent admissions in August and September for intractable nausea and vomiting with gastroparesis and hematemesis presenting with intractable nausea and vomiting.  As previously stated, patient with noted admissions x2 over the past 2 to 3 months for similar issues with work-up in client including EGD which showed grade a esophagitis without active bleeding as well as being.  During her previous hospitalization patient has significant medication change with recommendation of patient being placed on Reglan Compazine and Protonix for treatment.  Patient reports significant intractable nausea and vomiting over the past 3 to 4 days despite treatment.  Patient denies any known triggers.  Has been compliant with home meds and medication regimen.  Positive generalized abdominal pain as well as chest pain or shortness of breath.  Has had 1-2 episodes of coffee-ground emesis.  No diarrhea.  No hemiparesis or confusion.  No shortness of breath. Presented to the ER afebrile, heart rate 100s, blood pressure 180s over 100 satting well on room air.  Labs include a white count of 8.2, hemoglobin 9.6.  Noted prior hemoglobin 10 days ago of 10.3.  Creatinine 2.73 with a baseline creatinine around 2.4-2.8.  Troponin of 49 with a baseline troponin which appears to be anywhere between 22 to the upper 30s.  EKG was sinus tach. Review of Systems: As mentioned in the history of present illness. All other systems reviewed  and are negative. Past Medical History:  Diagnosis Date   Anemia    Anxiety    Arthritis    Asthma    CHF (congestive heart failure) ([place])    Depression    Diabetic ulcer of left foot ([place]) 05/12/2013   Gastroparesis    Generalized abdominal pain    History of chicken pox    Loss of weight 12/02/2019   Migraines    Mood disorder (HCC)    anxiety   Prurigo nodularis    with diabetic dermopathy   Type 1 diabetes, uncontrolled, with neuropathy    [place]   Ulcers of both lower legs ([place]) 02/20/2014   Past Surgical History:  Procedure Laterality Date   BIOPSY  08/11/2020   Procedure: BIOPSY;  Surgeon: Ladene Artist, MD;  Location: [facility];  Service: Endoscopy;;   BIOPSY  04/02/2022   Procedure: BIOPSY;  Surgeon: Jackquline Denmark, MD;  Location: [facility];  Service: Gastroenterology;;   ESOPHAGOGASTRODUODENOSCOPY (EGD) WITH PROPOFOL N/A 08/11/2020   Procedure: ESOPHAGOGASTRODUODENOSCOPY (EGD) WITH PROPOFOL;  Surgeon: Ladene Artist, MD;  Location: [facility];  Service: Endoscopy;  Laterality: N/A;   ESOPHAGOGASTRODUODENOSCOPY (EGD) WITH PROPOFOL N/A 04/02/2022   Procedure: ESOPHAGOGASTRODUODENOSCOPY (EGD) WITH PROPOFOL;  Surgeon: Jackquline Denmark, MD;  Location: [facility];  Service: Gastroenterology;  Laterality: N/A;   ICD IMPLANT N/A 04/09/2022   Procedure: ICD IMPLANT;  Surgeon: Constance Haw, MD;  Location: [facility];  Service: Cardiovascular;  Laterality: N/A;   treadmill stress test  01/2013   WNL, low risk study   Social History:  reports that she has never smoked.  She has never used smokeless tobacco. She reports that she does not currently use alcohol. She reports that she does not use drugs.  Allergies  Allergen Reactions   Atorvastatin Other (See Comments)    Dizziness    Dulaglutide Nausea And Vomiting and Other (See Comments)    Caused pancreatitis    Zofran [Ondansetron] Nausea And Vomiting and Other (See Comments)    Causes nausea vomiting to  worsen / doesn't really work for patient.   Amoxicillin Itching and Rash   Lantus [Insulin Glargine] Itching and Rash   Miconazole Nitrate Nausea And Vomiting and Rash    Family History  Problem Relation Age of Onset   Diabetes Mother        type 2   Hypertension Mother    Thyroid disease Mother    Bipolar disorder Mother    Heart disease Mother    Calcium disorder Mother    Cancer Maternal Grandmother        Breast, stomach   Cancer Paternal Grandmother        stomach, lung (smoker)   Diabetes Paternal Grandmother    Diabetes Paternal Grandfather    Heart failure Sister    Diabetes Sister    Stroke Maternal Aunt    Cancer Maternal Uncle        prostate   CAD Maternal Aunt        stents   Cancer Maternal Aunt 64       ovarian    Prior to Admission medications   Medication Sig Start Date End Date Taking? Authorizing Provider  acetaminophen (TYLENOL) 500 MG tablet Take 1,000 mg by mouth 3 (three) times daily as needed for moderate pain.   Yes [provider]  albuterol (PROAIR HFA) 108 (90 Base) MCG/ACT inhaler Inhale 2 puffs into the lungs every 6 (six) hours as needed for wheezing or shortness of breath. 07/03/20  Yes [name], PA-C  Carboxymethylcellul-Glycerin (LUBRICATING EYE DROPS OP) Place 1-2 drops into both eyes daily as needed (dry eyes).   Yes [provider]  LORazepam (ATIVAN) 0.5 MG tablet Take 0.5 mg by mouth daily as needed. 05/11/22  Yes [provider]  metoCLOPramide (REGLAN) 10 MG tablet Take 1 tablet (10 mg total) by mouth 3 (three) times daily before meals. 04/29/22  Yes Hongalgi, [name], MD  metoprolol succinate (TOPROL XL) 25 MG 24 hr tablet Take 1 tablet (25 mg total) by mouth daily. 04/19/22  Yes [name], PA-C  Multiple Vitamin (MULTIVITAMIN WITH MINERALS) TABS tablet Take 1 tablet by mouth daily. 04/30/22  Yes Hongalgi, [name], MD  NOVOLOG 100 UNIT/ML injection Inject 0-120 Units into the skin daily.  Per sliding scale per patient. Patient states it is an insulin pump 04/06/20  Yes [provider]  pantoprazole (PROTONIX) 40 MG tablet Take 1 tablet (40 mg total) by mouth 2 (two) times daily before a meal. 04/29/22 06/28/22 Yes Hongalgi, [name], MD  polyethylene glycol (MIRALAX / GLYCOLAX) 17 g packet Take 17 g by mouth daily as needed for moderate constipation.   Yes [provider]  pregabalin (LYRICA) 50 MG capsule Take 50 mg by mouth 2 (two) times daily as needed (neuropathy). 12/05/21  Yes [provider]  prochlorperazine (COMPAZINE) 10 MG tablet Take 1 tablet (10 mg total) by mouth every 8 (eight) hours as needed for nausea or vomiting. 04/29/22  Yes Hongalgi, [name], MD  promethazine (PHENERGAN) 12.5 MG tablet Take 12.5 mg by mouth  every 6 (six) hours as needed for nausea or vomiting.   Yes [provider]  rosuvastatin (CRESTOR) 20 MG tablet Take 1 tablet (20 mg total) by mouth daily. 04/12/22 05/13/22 Yes Pahwani, Einar Grad, MD  tobramycin (TOBREX) 0.3 % ophthalmic solution Place 1-2 drops into both eyes See admin instructions. Instill 1-2 drops into both eyes 3 times daily the day before, day of, and the day after every 6 weeks eye injections per patient 11/17/20  Yes [provider]  torsemide (DEMADEX) 20 MG tablet Take 20 mg by mouth daily as needed (swelling). 12/25/21  Yes [provider]  TRESIBA FLEXTOUCH 200 UNIT/ML FlexTouch Pen Inject 50 Units into the skin daily as needed (when insulin pump stops working). 11/04/21  Yes [provider]  Continuous Blood Gluc Sensor (FREESTYLE LIBRE 2 SENSOR) MISC Inject 1 Device into the skin every 14 (fourteen) days. 03/20/20   [provider]  Ensure Max Protein (ENSURE MAX PROTEIN) LIQD Take 330 mLs (11 oz total) by mouth daily. 04/30/22   Hongalgi, [name], MD  Insulin Disposable Pump (OMNIPOD 10 PACK) MISC by Does not apply route.    [provider]    Physical Exam: Physical  Exam Constitutional:      Comments: Under weight, NAD   HENT:     Head: Normocephalic.     Nose: Nose normal.     Mouth/Throat:     Mouth: Mucous membranes are dry.  Eyes:     Pupils: Pupils are equal, round, and reactive to light.  Cardiovascular:     Rate and Rhythm: Normal rate and regular rhythm.     Pulses: Normal pulses.  Pulmonary:     Effort: Pulmonary effort is normal.  Musculoskeletal:        General: Normal range of motion.  Skin:    General: Skin is dry.     Comments: + generalized ulcerations on LE bilaterally   Neurological:     General: No focal deficit present.  Psychiatric:        Mood and Affect: Mood normal.     Vitals:   05/12/22 2233 05/13/22 0030 05/13/22 0100  BP: (!) 189/114 118/67 118/74  Pulse: (!) 111 95 98  Resp: $Remo'18 15 20  'TMWsr$ Temp: 98.8 F (37.1 C)    TempSrc: Oral    SpO2: 100% 95% 95%    Data Reviewed:There are no new results to review at this time.  Assessment and Plan: Intractable nausea and vomiting Recurring issue in setting of diabetic gastroparesis with admission 9/19-9/25 for similar issues  IVF NPO Will place patient on regimen at discharge including Reglan, Compazine, Protonix    Diabetic gastroparesis ([place]) Acute flare on presentation  Noted recent GI evaluation including:  EGD 8/29: Grade a reflux esophagitis without bleeding.  GES 04/01/2019: Normal Will place patient on regimen at discharge including Reglan, Compazine, Protonix  Follow     Coffee ground emesis Patient self reports coffee-ground emesis in the setting of gastroparesis flare Noted EGD August 29 with esophagitis without bleeding Hemoglobin appears relatively stable between 9.6 and 10.3 over the past 1 to 2 weeks. Improved from prior admission We will monitor for now IV PPI Hold anticoagulation GI consult/reevaluation as clinically indicated   Stage 3b chronic kidney disease (CKD) (HCC) Cr 2.7 today  Appears near baseline  Renal 04/2022 WNL Hold  nephrotoxic agents  Gentle IVF     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) SSI  Essential hypertension BP stable Titrate  home regimen  Chronic diastolic CHF (congestive heart failure) (HCC) Recent 2D ECHO w/ EF 50-55% w. Grade 2 diastolic dysfunctions/p AICD placement in setting of cardiac arrest  Clinically dry  Gentle IVF hydration  Monitor volume status  Strict Is and Os and daily weights    NSTEMI (non-ST elevated myocardial infarction) (HCC) Troponin 40s on presentation with nonspecific chest pain in the setting of gastroparesis flare Appears to be recurring issue Suspect CKD likely confounding issue with poor renal clearance EKG with sinus tach and otherwise within normal limits We will cycle cardiac enzymes overnight Hold antiplatelet treatment in the setting of coffee-ground emesis Follow Cardiology consult as clinically appropriate  Cardiac arrest [facility]) Noted hospitalization 04/05/2022 - 04/11/2022 for syncope, cardiac arrest 2/2 VT/VF that required on field AED shock s/p AICD placement Appears relatively stable from a cardiac standpoint  Follow closely      Advance Care Planning:   Code Status: Full Code   Consults: None   Family Communication: No family at bedside   Severity of Illness: The appropriate patient status for this patient is OBSERVATION. Observation status is judged to be reasonable and necessary in order to provide the required intensity of service to ensure the patient's safety. The patient's presenting symptoms, physical exam findings, and initial radiographic and laboratory data in the context of their medical condition is felt to place them at decreased risk for further clinical deterioration. Furthermore, it is anticipated that the patient will be medically stable for discharge from the hospital within 2 midnights of admission.   Author: Deneise Lever, MD 05/13/2022 2:16 AM  For on call review [email or web address].

## 2022-05-13 NOTE — Assessment & Plan Note (Addendum)
Noted hospitalization 04/05/2022 - 04/11/2022 for syncope, cardiac arrest 2/2 VT/VF that required on field AED shock s/p AICD placement Appears relatively stable from a cardiac standpoint  Follow closely

## 2022-05-13 NOTE — ED Notes (Signed)
Provided pt 4oz apple juice d/t cbg of 62. Will recheck in 27m

## 2022-05-13 NOTE — Assessment & Plan Note (Signed)
Recent 2D ECHO w/ EF 50-55% w. Grade 2 diastolic dysfunctions/p AICD placement in setting of cardiac arrest  Clinically dry  Gentle IVF hydration  Monitor volume status  Strict Is and Os and daily weights

## 2022-05-13 NOTE — Assessment & Plan Note (Signed)
Cr 2.7 today  Appears near baseline  Renal 04/2022 WNL Hold nephrotoxic agents  Gentle IVF

## 2022-05-14 DIAGNOSIS — K3184 Gastroparesis: Secondary | ICD-10-CM | POA: Diagnosis not present

## 2022-05-14 LAB — COMPREHENSIVE METABOLIC PANEL
ALT: 11 U/L (ref 0–44)
AST: 11 U/L — ABNORMAL LOW (ref 15–41)
Albumin: 2.4 g/dL — ABNORMAL LOW (ref 3.5–5.0)
Alkaline Phosphatase: 55 U/L (ref 38–126)
Anion gap: 5 (ref 5–15)
BUN: 19 mg/dL (ref 6–20)
CO2: 24 mmol/L (ref 22–32)
Calcium: 8.1 mg/dL — ABNORMAL LOW (ref 8.9–10.3)
Chloride: 111 mmol/L (ref 98–111)
Creatinine, Ser: 2.56 mg/dL — ABNORMAL HIGH (ref 0.44–1.00)
GFR, Estimated: 22 mL/min — ABNORMAL LOW (ref >60)
Glucose, Bld: 204 mg/dL — ABNORMAL HIGH (ref 70–99)
Potassium: 3.9 mmol/L (ref 3.5–5.1)
Sodium: 140 mmol/L (ref 135–145)
Total Bilirubin: 0.9 mg/dL (ref 0.3–1.2)
Total Protein: 5.1 g/dL — ABNORMAL LOW (ref 6.5–8.1)

## 2022-05-14 LAB — PHOSPHORUS: Phosphorus: 4.2 mg/dL (ref 2.5–4.6)

## 2022-05-14 LAB — CBC
HCT: 26.2 % — ABNORMAL LOW (ref 36.0–46.0)
Hemoglobin: 8.4 g/dL — ABNORMAL LOW (ref 12.0–15.0)
MCH: 28.4 pg (ref 26.0–34.0)
MCHC: 32.1 g/dL (ref 30.0–36.0)
MCV: 88.5 fL (ref 80.0–100.0)
Platelets: 175 10*3/uL (ref 150–400)
RBC: 2.96 MIL/uL — ABNORMAL LOW (ref 3.87–5.11)
RDW: 14.3 % (ref 11.5–15.5)
WBC: 6.7 10*3/uL (ref 4.0–10.5)
nRBC: 0 % (ref 0.0–0.2)

## 2022-05-14 LAB — GLUCOSE, CAPILLARY
Glucose-Capillary: 108 mg/dL — ABNORMAL HIGH (ref 70–99)
Glucose-Capillary: 137 mg/dL — ABNORMAL HIGH (ref 70–99)
Glucose-Capillary: 175 mg/dL — ABNORMAL HIGH (ref 70–99)
Glucose-Capillary: 225 mg/dL — ABNORMAL HIGH (ref 70–99)

## 2022-05-14 LAB — MAGNESIUM: Magnesium: 1.7 mg/dL (ref 1.7–2.4)

## 2022-05-14 NOTE — Discharge Summary (Addendum)
Physician Discharge Summary  [name] MVE:[id number] DOB: 1971-02-11 DOA: 05/12/2022  PCP: [name], PA  Admit date: 05/12/2022  Discharge date: 05/14/2022  Admitted From: Home.  Disposition:  Home  Recommendations for Outpatient Follow-up:  Follow up with PCP in 1-2 weeks. Please obtain BMP/CBC in one week. Advised to follow-up with gastroenterology as scheduled. Patient has appointment with [facility] on 05/21/2022. Advised to take Phenergan and Reglan as needed for nausea and vomiting  Home Health:None Equipment/Devices:None  Discharge Condition: Stable CODE STATUS:Full code Diet recommendation: Heart Healthy  Brief [facility] Course: This 51 years old female with PMH significant for type 1 diabetes with insulin pump, complicated by diabetic neuropathy and gastroparesis, CKD stage IV, chronic diastolic CHF, hypertension, asthma, anxiety, depression, recent admissions in August and September for intractable nausea and vomiting with gastroparesis and hematemesis presented with intractable nausea and vomiting.  EGD during prior hospitalization showed grade A esophagitis without active bleeding.  Patient denies any recent triggers.  She has been compliant with her home medications. Patient was admitted for intractable nausea and vomiting in the setting of diabetic gastroparesis.  She was continued on Reglan,  Compazine and Protonix.  H&H remains stable. She was continued on IV pantoprazole. Eliquis was kept on hold. Renal functions are at baseline.  Troponins were slightly elevated could be due to demand ischemia in the setting of nausea and vomiting.  Patient denies any chest pain.  Patient was continued on supportive care,  IV hydration.  She reports feeling much improved.  She tolerated clear liquid diet,  advanced to full liquids to soft.  Patient reports nausea and vomiting has improved.She wants to be discharged.  Patient is being discharged home, She has  appointment with the [facility] on 05/21/2022.  Discharge Diagnoses:  Principal Problem:   Gastroparesis Active Problems:   Diabetic gastroparesis (HCC)   Intractable nausea and vomiting   Coffee ground emesis   Stage 3b chronic kidney disease (CKD) (HCC)   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)   Essential hypertension   Chronic diastolic CHF (congestive heart failure) ([place])   Cardiac arrest (HCC)   NSTEMI (non-ST elevated myocardial infarction) [name])    Discharge Instructions  Discharge Instructions     Call MD for:  persistant dizziness or light-headedness   Complete by: As directed    Call MD for:  persistant nausea and vomiting   Complete by: As directed    Diet - low sodium heart healthy   Complete by: As directed    Diet full liquid   Complete by: As directed    Discharge instructions   Complete by: As directed    Advised to follow-up with primary care physician in 1 week. Advised to follow-up with gastroenterology as scheduled. Patient has appointment with [facility] on 05/21/2022. Advised to take Phenergan and Reglan as needed for nausea and vomiting   Increase activity slowly   Complete by: As directed    No wound care   Complete by: As directed       Allergies as of 05/14/2022       Reactions   Atorvastatin Other (See Comments)   Dizziness    Dulaglutide Nausea And Vomiting, Other (See Comments)   Caused pancreatitis    Zofran [ondansetron] Nausea And Vomiting, Other (See Comments)   Causes nausea vomiting to worsen / doesn't really work for patient.   Amoxicillin Itching, Rash   Lantus [insulin Glargine] Itching, Rash   Miconazole Nitrate Nausea  And Vomiting, Rash        Medication List     TAKE these medications    acetaminophen 500 MG tablet Commonly known as: TYLENOL Take 1,000 mg by mouth 3 (three) times daily as needed for moderate pain.   albuterol 108 (90 Base) MCG/ACT inhaler Commonly known as: ProAir HFA Inhale 2  puffs into the lungs every 6 (six) hours as needed for wheezing or shortness of breath.   Ensure Max Protein Liqd Take 330 mLs (11 oz total) by mouth daily.   FreeStyle Libre 2 Sensor Misc Inject 1 Device into the skin every 14 (fourteen) days.   LORazepam 0.5 MG tablet Commonly known as: ATIVAN Take 0.5 mg by mouth daily as needed.   LUBRICATING EYE DROPS OP Place 1-2 drops into both eyes daily as needed (dry eyes).   metoCLOPramide 10 MG tablet Commonly known as: Reglan Take 1 tablet (10 mg total) by mouth 3 (three) times daily before meals.   metoprolol succinate 25 MG 24 hr tablet Commonly known as: Toprol XL Take 1 tablet (25 mg total) by mouth daily.   multivitamin with minerals Tabs tablet Take 1 tablet by mouth daily.   NovoLOG 100 UNIT/ML injection Generic drug: insulin aspart Inject 0-120 Units into the skin daily. Per sliding scale per patient. Patient states it is an insulin pump   OmniPod 10 Pack Misc by Does not apply route.   pantoprazole 40 MG tablet Commonly known as: Protonix Take 1 tablet (40 mg total) by mouth 2 (two) times daily before a meal.   polyethylene glycol 17 g packet Commonly known as: MIRALAX / GLYCOLAX Take 17 g by mouth daily as needed for moderate constipation.   pregabalin 50 MG capsule Commonly known as: LYRICA Take 50 mg by mouth 2 (two) times daily as needed (neuropathy).   prochlorperazine 10 MG tablet Commonly known as: COMPAZINE Take 1 tablet (10 mg total) by mouth every 8 (eight) hours as needed for nausea or vomiting.   promethazine 12.5 MG tablet Commonly known as: PHENERGAN Take 12.5 mg by mouth every 6 (six) hours as needed for nausea or vomiting.   rosuvastatin 20 MG tablet Commonly known as: CRESTOR Take 1 tablet (20 mg total) by mouth daily.   tobramycin 0.3 % ophthalmic solution Commonly known as: TOBREX Place 1-2 drops into both eyes See admin instructions. Instill 1-2 drops into both eyes 3 times daily the  day before, day of, and the day after every 6 weeks eye injections per patient   torsemide 20 MG tablet Commonly known as: DEMADEX Take 20 mg by mouth daily as needed (swelling).   [name] 200 UNIT/ML FlexTouch Pen Generic drug: insulin degludec Inject 50 Units into the skin daily as needed (when insulin pump stops working).        Follow-up Information     Star, Olivia M, [place] Follow up in 1 week(s).   Specialty: Internal Medicine Contact information: [address] [place] [address] [phone]         [name], MD .   Specialty: Cardiology Contact information: [phone] [place] [address] [phone]         Mealor, Yetta Barre, MD .   Specialty: Cardiology Contact information: [place] [address] [phone]         Gastroenterology, [facility] [place] Follow up in 1 week(s).   Contact information: [place]  [place] [address] [phone]                Allergies  Allergen Reactions   Atorvastatin Other (See Comments)    Dizziness    Dulaglutide Nausea And Vomiting and Other (See Comments)    Caused pancreatitis    Zofran [Ondansetron] Nausea And Vomiting and Other (See Comments)    Causes nausea vomiting to worsen / doesn't really work for patient.   Amoxicillin Itching and Rash   Lantus [Insulin Glargine] Itching and Rash   Miconazole Nitrate Nausea And Vomiting and Rash    Consultations: None   Procedures/Studies: DG Abd 1 View  Result Date: 04/26/2022 CLINICAL DATA:  Nausea, vomiting. EXAM: ABDOMEN - 1 VIEW COMPARISON:  None Available. FINDINGS: The bowel gas pattern is normal. No radio-opaque calculi or other significant radiographic abnormality are seen. IMPRESSION: Negative. Electronically Signed   By: Marijo Conception M.D.   On: 04/26/2022 13:25   US RENAL  Result Date: 04/23/2022 CLINICAL DATA:  Acute renal insufficiency EXAM:  RENAL / URINARY TRACT ULTRASOUND COMPLETE COMPARISON:  04/21/2022, 04/07/2022 FINDINGS: Right Kidney: Renal measurements: 12.2 x 4.7 by 4.8 cm = volume: 145 mL. Stable benign peripelvic cyst upper pole measuring 1.5 cm. Normal renal echotexture. No hydronephrosis. Left Kidney: Renal measurements: 12.0 x 6.2 x 5.5 cm = volume: 212 mL. Echogenicity within normal limits. No mass or hydronephrosis visualized. Bladder: Appears normal for degree of bladder distention. Other: None. IMPRESSION: 1. Stable renal ultrasound.  No acute findings. Electronically Signed   By: Randa Ngo M.D.   On: 04/23/2022 20:35   CT Angio Chest/Abd/Pel for Dissection W and/or Wo Contrast  Result Date: 04/21/2022 CLINICAL DATA:  Acute aortic syndrome. Nausea vomiting. Patient is known renal insufficiency and risk/benefit of contrast administration was discussed with the patient by the ER physician. EXAM: CT ANGIOGRAPHY CHEST, ABDOMEN AND PELVIS TECHNIQUE: Non-contrast CT of the chest was initially obtained. Multidetector CT imaging through the chest, abdomen and pelvis was performed using the standard protocol during bolus administration of intravenous contrast. Multiplanar reconstructed images and MIPs were obtained and reviewed to evaluate the vascular anatomy. RADIATION DOSE REDUCTION: This exam was performed according to the departmental dose-optimization program which includes automated exposure control, adjustment of the mA and/or kV according to patient size and/or use of iterative reconstruction technique. CONTRAST:  55mL OMNIPAQUE IOHEXOL 350 MG/ML SOLN COMPARISON:  CTA abdomen and pelvis 08/24/2020. FINDINGS: CTA CHEST FINDINGS Cardiovascular: Pre contrast imaging shows no hyperdense crescent in the wall of the thoracic aorta to suggest the presence of an acute intramural hematoma. Heart size upper normal. No pericardial effusion. No thoracic aortic aneurysm. No dissection of the thoracic aorta. Arterial arch vessel anatomy is  widely patent. Left-sided permanent pacemaker noted. Mediastinum/Nodes: No mediastinal lymphadenopathy. There is no hilar lymphadenopathy. The esophagus has normal imaging features. There is no axillary lymphadenopathy. Lungs/Pleura: Stable biapical pleuroparenchymal scarring. Compressive atelectasis noted in both lower lobes. No suspicious pulmonary nodule or mass. Tiny bilateral pleural effusions evident. Musculoskeletal: No worrisome lytic or sclerotic osseous abnormality. Review of the MIP images confirms the above findings. CTA ABDOMEN AND PELVIS FINDINGS VASCULAR Aorta: Normal caliber aorta without aneurysm, dissection, vasculitis or significant stenosis. Celiac: Patent without evidence of aneurysm, dissection, vasculitis or significant stenosis. SMA: Patent without evidence of aneurysm, dissection, vasculitis or significant stenosis. Renals: Both renal arteries are patent without evidence of aneurysm, dissection, vasculitis, fibromuscular dysplasia or significant stenosis. Accessory left renal artery noted. IMA: Patent without evidence of aneurysm, dissection, vasculitis  or significant stenosis. Inflow: Patent without evidence of aneurysm, dissection, vasculitis or significant stenosis. Veins: No obvious venous abnormality within the limitations of this arterial phase study. Review of the MIP images confirms the above findings. NON-VASCULAR Hepatobiliary: No suspicious focal abnormality within the liver parenchyma. There is no evidence for gallstones, gallbladder wall thickening, or pericholecystic fluid. No intrahepatic or extrahepatic biliary dilation. Pancreas: No focal mass lesion. No dilatation of the main duct. No intraparenchymal cyst. No peripancreatic edema. Spleen: No splenomegaly. No focal mass lesion. Adrenals/Urinary Tract: No adrenal nodule or mass. Kidneys unremarkable. No evidence for hydroureter. The urinary bladder appears normal for the degree of distention. Stomach/Bowel: Stomach is  unremarkable. No gastric wall thickening. No evidence of outlet obstruction. Duodenum is normally positioned as is the ligament of Treitz. No small bowel wall thickening. No small bowel dilatation. The terminal ileum is normal. The appendix is normal. No gross colonic mass. No colonic wall thickening. Lymphatic: There is no gastrohepatic or hepatoduodenal ligament lymphadenopathy. No retroperitoneal or mesenteric lymphadenopathy. No pelvic sidewall lymphadenopathy. Reproductive: The uterus is unremarkable.  There is no adnexal mass. Other: Trace intraperitoneal free fluid noted in the cul-de-sac, nonspecific and potentially physiologic. Musculoskeletal: No worrisome lytic or sclerotic osseous abnormality. Review of the MIP images confirms the above findings. IMPRESSION: 1. No evidence for thoracoabdominal aortic aneurysm or dissection. 2. Tiny bilateral pleural effusions with associated compressive atelectasis in both lower lobes. 3. No acute findings in the abdomen or pelvis. 4. Trace intraperitoneal free fluid in the cul-de-sac, nonspecific and potentially physiologic. Electronically Signed   By: Misty Stanley M.D.   On: 04/21/2022 12:01   DG Chest Portable 1 View  Result Date: 04/21/2022 CLINICAL DATA:  Chest pain. EXAM: PORTABLE CHEST 1 VIEW COMPARISON:  04/10/2022 and older exams. FINDINGS: Cardiac silhouette is normal in size. Stable left anterior chest wall single lead AICD. No mediastinal or hilar masses. Prominent bronchovascular and interstitial markings. No lung consolidation or convincing edema. No pleural effusion or pneumothorax. Skeletal structures are grossly intact. IMPRESSION: No acute cardiopulmonary disease. Electronically Signed   By: Lajean Manes M.D.   On: 04/21/2022 10:34   CUP PACEART INCLINIC DEVICE CHECK  Result Date: 04/19/2022 Wound check appointment. Steri-strips removed. Wound without redness or edema. Incision edges approximated, wound well healed. Normal device function.  Thresholds, sensing, and impedances consistent with implant measurements. Device programmed at 3.5V for  extra safety margin until 3 month visit. Histogram distribution appropriate for patient and level of activity. No mode switches or ventricular arrhythmias noted. Patient educated about wound care, arm mobility, lifting restrictions, shock plan. ROV in 3  months with Dr. Myles Gip See scanned sheets   Subjective: Patient was seen and examined at bedside.  Overnight events noted.   Patient reported nausea and vomiting has improved and she feels better. She wants to be discharged.  Discharge Exam: Vitals:   05/14/22 0018 05/14/22 0424  BP: (!) 143/87 (!) 152/85  Pulse: 79 88  Resp:  18  Temp: 98.4 F (36.9 C) 98.9 F (37.2 C)  SpO2: 99% 100%   Vitals:   05/13/22 1745 05/13/22 2227 05/14/22 0018 05/14/22 0424  BP: 136/78 (!) 143/89 (!) 143/87 (!) 152/85  Pulse: 81 75 79 88  Resp: $Remo'18 17  18  'rvxIh$ Temp: 98.4 F (36.9 C) 98.4 F (36.9 C) 98.4 F (36.9 C) 98.9 F (37.2 C)  TempSrc: Oral Oral Oral Oral  SpO2: 99% 99% 99% 100%  Weight:      Height:  General: Pt is alert, awake, not in acute distress Cardiovascular: RRR, S1/S2 +, no rubs, no gallops Respiratory: CTA bilaterally, no wheezing, no rhonchi Abdominal: Soft, NT, ND, bowel sounds + Extremities: no edema, no cyanosis    The results of significant diagnostics from this hospitalization (including imaging, microbiology, ancillary and laboratory) are listed below for reference.     Microbiology: No results found for this or any previous visit (from the past 240 hour(s)).   Labs: BNP (last 3 results) Recent Labs    03/25/22 0900 04/21/22 1003  BNP 709.8* 614.4*   Basic Metabolic Panel: Recent Labs  Lab 05/12/22 2222 05/13/22 0745 05/14/22 0632  NA 143 147* 140  K 4.2 3.5 3.9  CL 110 114* 111  CO2 $Re'25 28 24  'Axa$ GLUCOSE 185* 69* 204*  BUN 25* 25* 19  CREATININE 2.73* 2.80* 2.56*  CALCIUM 8.5* 8.4* 8.1*  MG  --    --  1.7  PHOS  --   --  4.2   Liver Function Tests: Recent Labs  Lab 05/12/22 2222 05/13/22 0745 05/14/22 0632  AST 18 15 11*  ALT $Re'9 11 11  'GXo$ ALKPHOS 65 67 55  BILITOT 1.2 0.9 0.9  PROT 6.3* 6.0* 5.1*  ALBUMIN 2.8* 2.6* 2.4*   Recent Labs  Lab 05/12/22 2222  LIPASE 22   No results for input(s): "AMMONIA" in the last 168 hours. CBC: Recent Labs  Lab 05/12/22 2222 05/13/22 0745 05/14/22 0632  WBC 8.2 7.6 6.7  NEUTROABS 6.3  --   --   HGB 9.6* 9.1* 8.4*  HCT 29.2* 29.1* 26.2*  MCV 85.6 88.7 88.5  PLT 214 217 175   Cardiac Enzymes: No results for input(s): "CKTOTAL", "CKMB", "CKMBINDEX", "TROPONINI" in the last 168 hours. BNP: Invalid input(s): "POCBNP" CBG: Recent Labs  Lab 05/13/22 2005 05/14/22 0014 05/14/22 0421 05/14/22 0711 05/14/22 1058  GLUCAP 229* 108* 137* 175* 225*   D-Dimer No results for input(s): "DDIMER" in the last 72 hours. Hgb A1c No results for input(s): "HGBA1C" in the last 72 hours. Lipid Profile No results for input(s): "CHOL", "HDL", "LDLCALC", "TRIG", "CHOLHDL", "LDLDIRECT" in the last 72 hours. Thyroid function studies No results for input(s): "TSH", "T4TOTAL", "T3FREE", "THYROIDAB" in the last 72 hours.  Invalid input(s): "FREET3" Anemia work up No results for input(s): "VITAMINB12", "FOLATE", "FERRITIN", "TIBC", "IRON", "RETICCTPCT" in the last 72 hours. Urinalysis    Component Value Date/Time   COLORURINE YELLOW 05/13/2022 0745   APPEARANCEUR CLEAR 05/13/2022 0745   LABSPEC 1.016 05/13/2022 0745   PHURINE 7.0 05/13/2022 0745   GLUCOSEU 150 (A) 05/13/2022 0745   HGBUR NEGATIVE 05/13/2022 0745   BILIRUBINUR NEGATIVE 05/13/2022 0745   BILIRUBINUR negative 08/30/2020 1631   KETONESUR NEGATIVE 05/13/2022 0745   PROTEINUR >=300 (A) 05/13/2022 0745   UROBILINOGEN 0.2 08/30/2020 1631   UROBILINOGEN 1.0 08/11/2008 1745   NITRITE NEGATIVE 05/13/2022 0745   LEUKOCYTESUR NEGATIVE 05/13/2022 0745   Sepsis Labs Recent Labs   Lab 05/12/22 2222 05/13/22 0745 05/14/22 0632  WBC 8.2 7.6 6.7   Microbiology No results found for this or any previous visit (from the past 240 hour(s)).   Time coordinating discharge: Over 30 minutes  SIGNED:   Shawna Clamp, MD  [facility] 05/14/2022, 2:28 PM Pager   If 7PM-7AM, please contact night-coverage [email or web address] Password [email or web address]

## 2022-05-14 NOTE — Discharge Instructions (Signed)
Advised to follow-up with primary care physician in 1 week. Advised to follow-up with gastroenterology as scheduled. Patient has appointment with [facility] on 05/21/2022. Advised to take Phenergan and Reglan as needed for nausea and vomiting

## 2022-05-14 NOTE — TOC Initial Note (Signed)
Transition of Care [facility]) - Initial/Assessment Note    Patient Details  Name: [name] MRN: [id number] Date of Birth: 08-11-1970  [facility]) CM/SW Contact:    Leeroy Cha, RN Phone Number: 05/14/2022, 8:28 AM  Clinical Narrative:                  Transition of Care [facility]) Screening Note   Patient Details  Name: [name] Date of Birth: 1970/08/16   Transition of Care [facility]) CM/SW Contact:    Leeroy Cha, RN Phone Number: 05/14/2022, 8:28 AM    Transition of Care Department [facility]) has reviewed patient and no TOC needs have been identified at this time. We will continue to monitor patient advancement through interdisciplinary progression rounds. If new patient transition needs arise, please place a TOC consult.    Expected Discharge Plan: Home/Self Care Barriers to Discharge: Continued Medical Work up   Patient Goals and CMS Choice Patient states their goals for this hospitalization and ongoing recovery are:: to go home CMS Medicare.gov Compare Post Acute Care list provided to:: Patient    Expected Discharge Plan and Services Expected Discharge Plan: Home/Self Care   Discharge Planning Services: CM Consult   Living arrangements for the past 2 months: Single Family Home                                      Prior Living Arrangements/Services Living arrangements for the past 2 months: Single Family Home Lives with:: Self Patient language and need for interpreter reviewed:: Yes Do you feel safe going back to the place where you live?: Yes            Criminal Activity/Legal Involvement Pertinent to Current Situation/Hospitalization: No - Comment as needed  Activities of Daily Living Home Assistive Devices/Equipment: Insulin Pump, Eyeglasses (freestyle, implanted defibrillator) ADL Screening (condition at time of admission) Patient's cognitive ability adequate to safely complete daily activities?: Yes Is the patient  deaf or have difficulty hearing?: No Does the patient have difficulty seeing, even when wearing glasses/contacts?: No Does the patient have difficulty concentrating, remembering, or making decisions?: No Patient able to express need for assistance with ADLs?: Yes Does the patient have difficulty dressing or bathing?: No Independently performs ADLs?: Yes (appropriate for developmental age) Does the patient have difficulty walking or climbing stairs?: No Weakness of Legs: Both Weakness of Arms/Hands: Both  Permission Sought/Granted                  Emotional Assessment Appearance:: Appears stated age Attitude/Demeanor/Rapport: Engaged Affect (typically observed): Calm Orientation: : Oriented to Self, Oriented to Place, Oriented to  Time, Oriented to Situation Alcohol / Substance Use: Alcohol Use (no current use) Psych Involvement: No (comment)  Admission diagnosis:  Gastroparesis [K31.84] Patient Active Problem List   Diagnosis Date Noted   Gastroparesis 05/13/2022   NSTEMI (non-ST elevated myocardial infarction) ([place]) 05/13/2022   Abdominal pain, epigastric    Chronic vomiting    Anemia    History of esophagitis    Intractable nausea and vomiting 04/23/2022   Cardiac arrest ([place]) 04/05/2022   Essential hypertension 04/05/2022   Acute esophagitis 04/03/2022   GI bleed 04/02/2022   Coffee ground emesis 04/01/2022   AKI (acute kidney injury) ([place]) 03/25/2022   Stage 3b chronic kidney disease (CKD) ([place]) 03/25/2022   Inguinal adenopathy 12/14/2021   Diabetic gastroparesis ([place]) 08/31/2020   Gastric  polyps    Bone pain 06/16/2020   Deficiency anemia 04/21/2020   Chronic diastolic CHF (congestive heart failure) ([place]) 12/14/2019   Leg edema 11/21/2019   Dysfunctional uterine bleeding 10/11/2019   Nausea and vomiting    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[place]) 07/30/2016   Multiple thyroid nodules 33/58/2518   Complicated grieving 98/42/1031   Diabetic foot ulcers ([place])  05/01/2013   Skin rash 01/05/2013   Contraception management 12/31/2012   Mood disorder ([place]) 12/30/2012   Unspecified vitamin D deficiency 12/03/2012   H/O thyroid nodule 12/03/2012   Type 1 diabetes mellitus with diabetic neuropathy ([place]) 06/18/2007   PCP:  [name], PA Pharmacy:   [facility] [address] [place] [address] Phone: [phone] Fax: [place], [place] [address] [place] [address] Phone: [phone] Fax: [place] [address] Phone: [phone] Fax: [place] [phone] - [address] [place] [address] Phone: [phone] Fax: [phone]     Social Determinants of Health ([place]) Interventions    Readmission Risk Interventions   Row Labels 04/26/2022   10:53 AM  Readmission Risk Prevention Plan   Section Header. No data exists in this row.   Transportation Screening   Complete  PCP or Specialist Appt within 5-7 Days   Complete  Home Care Screening   Complete  Medication Review (RN CM)   Complete

## 2022-05-14 NOTE — TOC Transition Note (Signed)
Transition of Care [facility]) - CM/SW Discharge Note   Patient Details  Name: [name] MRN: [id number] Date of Birth: 28-Dec-1970  [facility]) CM/SW Contact:  Leeroy Cha, RN Phone Number: 05/14/2022, 10:38 AM   Clinical Narrative:     [id number] discharged to return home.  Chart reviewed for TOC needs.  None found.  Patient self care.  Final next level of care: Home/Self Care Barriers to Discharge: Barriers Resolved   Patient Goals and CMS Choice Patient states their goals for this hospitalization and ongoing recovery are:: to go home CMS Medicare.gov Compare Post Acute Care list provided to:: Patient    Discharge Placement                       Discharge Plan and Services   Discharge Planning Services: CM Consult                                 Social Determinants of Health (SDOH) Interventions     Readmission Risk Interventions   Row Labels 04/26/2022   10:53 AM  Readmission Risk Prevention Plan   Section Header. No data exists in this row.   Transportation Screening   Complete  PCP or Specialist Appt within 5-7 Days   Complete  Home Care Screening   Complete  Medication Review (RN CM)   Complete

## 2022-05-16 DIAGNOSIS — R1110 Vomiting, unspecified: Secondary | ICD-10-CM | POA: Diagnosis not present

## 2022-05-16 DIAGNOSIS — N184 Chronic kidney disease, stage 4 (severe): Secondary | ICD-10-CM | POA: Diagnosis not present

## 2022-05-16 DIAGNOSIS — K3184 Gastroparesis: Secondary | ICD-10-CM | POA: Diagnosis not present

## 2022-05-16 DIAGNOSIS — I129 Hypertensive chronic kidney disease with stage 1 through stage 4 chronic kidney disease, or unspecified chronic kidney disease: Secondary | ICD-10-CM | POA: Diagnosis not present

## 2022-05-21 ENCOUNTER — Other Ambulatory Visit (HOSPITAL_COMMUNITY): Payer: Self-pay | Admitting: Gastroenterology

## 2022-05-21 ENCOUNTER — Emergency Department (HOSPITAL_BASED_OUTPATIENT_CLINIC_OR_DEPARTMENT_OTHER): Payer: BC Managed Care – PPO

## 2022-05-21 ENCOUNTER — Other Ambulatory Visit: Payer: Self-pay

## 2022-05-21 ENCOUNTER — Emergency Department (HOSPITAL_BASED_OUTPATIENT_CLINIC_OR_DEPARTMENT_OTHER): Payer: BC Managed Care – PPO | Admitting: Radiology

## 2022-05-21 ENCOUNTER — Encounter (HOSPITAL_BASED_OUTPATIENT_CLINIC_OR_DEPARTMENT_OTHER): Payer: Self-pay

## 2022-05-21 ENCOUNTER — Emergency Department (HOSPITAL_BASED_OUTPATIENT_CLINIC_OR_DEPARTMENT_OTHER)
Admission: EM | Admit: 2022-05-21 | Discharge: 2022-05-21 | Disposition: A | Discharge status: Home / Self Care | Payer: BC Managed Care – PPO | Attending: Emergency Medicine | Admitting: Emergency Medicine

## 2022-05-21 ENCOUNTER — Other Ambulatory Visit: Payer: Self-pay | Admitting: Gastroenterology

## 2022-05-21 DIAGNOSIS — R110 Nausea: Secondary | ICD-10-CM | POA: Diagnosis not present

## 2022-05-21 DIAGNOSIS — Z20822 Contact with and (suspected) exposure to covid-19: Secondary | ICD-10-CM | POA: Diagnosis not present

## 2022-05-21 DIAGNOSIS — Z794 Long term (current) use of insulin: Secondary | ICD-10-CM | POA: Diagnosis not present

## 2022-05-21 DIAGNOSIS — R112 Nausea with vomiting, unspecified: Secondary | ICD-10-CM | POA: Diagnosis not present

## 2022-05-21 DIAGNOSIS — R109 Unspecified abdominal pain: Secondary | ICD-10-CM | POA: Diagnosis not present

## 2022-05-21 DIAGNOSIS — R000 Tachycardia, unspecified: Secondary | ICD-10-CM | POA: Insufficient documentation

## 2022-05-21 DIAGNOSIS — R739 Hyperglycemia, unspecified: Secondary | ICD-10-CM

## 2022-05-21 DIAGNOSIS — E1065 Type 1 diabetes mellitus with hyperglycemia: Secondary | ICD-10-CM | POA: Diagnosis not present

## 2022-05-21 DIAGNOSIS — K3184 Gastroparesis: Secondary | ICD-10-CM | POA: Diagnosis not present

## 2022-05-21 DIAGNOSIS — E1165 Type 2 diabetes mellitus with hyperglycemia: Secondary | ICD-10-CM | POA: Diagnosis not present

## 2022-05-21 LAB — HEPATIC FUNCTION PANEL
ALT: 5 U/L (ref 0–44)
AST: 12 U/L — ABNORMAL LOW (ref 15–41)
Albumin: 3.6 g/dL (ref 3.5–5.0)
Alkaline Phosphatase: 85 U/L (ref 38–126)
Bilirubin, Direct: 0.1 mg/dL (ref 0.0–0.2)
Indirect Bilirubin: 0.8 mg/dL (ref 0.3–0.9)
Total Bilirubin: 0.9 mg/dL (ref 0.3–1.2)
Total Protein: 7.3 g/dL (ref 6.5–8.1)

## 2022-05-21 LAB — CBC WITH DIFFERENTIAL/PLATELET
Abs Immature Granulocytes: 0.03 10*3/uL (ref 0.00–0.07)
Basophils Absolute: 0 10*3/uL (ref 0.0–0.1)
Basophils Relative: 0 %
Eosinophils Absolute: 0 10*3/uL (ref 0.0–0.5)
Eosinophils Relative: 0 %
HCT: 31.7 % — ABNORMAL LOW (ref 36.0–46.0)
Hemoglobin: 10.4 g/dL — ABNORMAL LOW (ref 12.0–15.0)
Immature Granulocytes: 0 %
Lymphocytes Relative: 15 %
Lymphs Abs: 1.2 10*3/uL (ref 0.7–4.0)
MCH: 27.7 pg (ref 26.0–34.0)
MCHC: 32.8 g/dL (ref 30.0–36.0)
MCV: 84.3 fL (ref 80.0–100.0)
Monocytes Absolute: 0.3 10*3/uL (ref 0.1–1.0)
Monocytes Relative: 4 %
Neutro Abs: 6.1 10*3/uL (ref 1.7–7.7)
Neutrophils Relative %: 81 %
Platelets: 321 10*3/uL (ref 150–400)
RBC: 3.76 MIL/uL — ABNORMAL LOW (ref 3.87–5.11)
RDW: 14.2 % (ref 11.5–15.5)
WBC: 7.6 10*3/uL (ref 4.0–10.5)
nRBC: 0 % (ref 0.0–0.2)

## 2022-05-21 LAB — TROPONIN I (HIGH SENSITIVITY)
Troponin I (High Sensitivity): 29 ng/L — ABNORMAL HIGH (ref <18)
Troponin I (High Sensitivity): 32 ng/L — ABNORMAL HIGH (ref <18)

## 2022-05-21 LAB — BASIC METABOLIC PANEL
Anion gap: 16 — ABNORMAL HIGH (ref 5–15)
Anion gap: 12 (ref 5–15)
BUN: 28 mg/dL — ABNORMAL HIGH (ref 6–20)
BUN: 28 mg/dL — ABNORMAL HIGH (ref 6–20)
CO2: 25 mmol/L (ref 22–32)
CO2: 27 mmol/L (ref 22–32)
Calcium: 9.6 mg/dL (ref 8.9–10.3)
Calcium: 8.8 mg/dL — ABNORMAL LOW (ref 8.9–10.3)
Chloride: 102 mmol/L (ref 98–111)
Chloride: 107 mmol/L (ref 98–111)
Creatinine, Ser: 2.88 mg/dL — ABNORMAL HIGH (ref 0.44–1.00)
Creatinine, Ser: 2.69 mg/dL — ABNORMAL HIGH (ref 0.44–1.00)
GFR, Estimated: 19 mL/min — ABNORMAL LOW (ref >60)
GFR, Estimated: 21 mL/min — ABNORMAL LOW (ref >60)
Glucose, Bld: 303 mg/dL — ABNORMAL HIGH (ref 70–99)
Glucose, Bld: 286 mg/dL — ABNORMAL HIGH (ref 70–99)
Potassium: 4.3 mmol/L (ref 3.5–5.1)
Potassium: 3.9 mmol/L (ref 3.5–5.1)
Sodium: 143 mmol/L (ref 135–145)
Sodium: 146 mmol/L — ABNORMAL HIGH (ref 135–145)

## 2022-05-21 LAB — CBG MONITORING, ED
Glucose-Capillary: 242 mg/dL — ABNORMAL HIGH (ref 70–99)
Glucose-Capillary: 270 mg/dL — ABNORMAL HIGH (ref 70–99)
Glucose-Capillary: 212 mg/dL — ABNORMAL HIGH (ref 70–99)

## 2022-05-21 LAB — RESP PANEL BY RT-PCR (FLU A&B, COVID) ARPGX2
Influenza A by PCR: NEGATIVE
Influenza B by PCR: NEGATIVE
SARS Coronavirus 2 by RT PCR: NEGATIVE

## 2022-05-21 MED ORDER — DROPERIDOL 2.5 MG/ML IJ SOLN
2.5000 mg | Freq: Once | INTRAMUSCULAR | Status: AC
  Administered 2022-05-21: 2.5 mg via INTRAVENOUS
  Filled 2022-05-21: qty 2

## 2022-05-21 MED ORDER — PANTOPRAZOLE SODIUM 40 MG IV SOLR
40.0000 mg | Freq: Once | INTRAVENOUS | Status: AC
  Administered 2022-05-21: 40 mg via INTRAVENOUS
  Filled 2022-05-21: qty 10

## 2022-05-21 MED ORDER — LABETALOL HCL 5 MG/ML IV SOLN
5.0000 mg | Freq: Once | INTRAVENOUS | Status: AC
  Administered 2022-05-21: 5 mg via INTRAVENOUS
  Filled 2022-05-21: qty 4

## 2022-05-21 MED ORDER — FAMOTIDINE IN NACL 20-0.9 MG/50ML-% IV SOLN
20.0000 mg | Freq: Once | INTRAVENOUS | Status: AC
  Administered 2022-05-21: 20 mg via INTRAVENOUS
  Filled 2022-05-21: qty 50

## 2022-05-21 MED ORDER — PROMETHAZINE HCL 25 MG RE SUPP: 25.0000 mg | Freq: Four times a day (QID) | RECTAL | 0 refills | Status: DC | PRN

## 2022-05-21 MED ORDER — INSULIN PUMP
SUBCUTANEOUS | Status: DC
  Filled 2022-05-21: qty 1

## 2022-05-21 MED ORDER — METOCLOPRAMIDE HCL 5 MG/ML IJ SOLN
10.0000 mg | Freq: Once | INTRAMUSCULAR | Status: AC
  Administered 2022-05-21: 10 mg via INTRAVENOUS
  Filled 2022-05-21: qty 2

## 2022-05-21 MED ORDER — LACTATED RINGERS IV BOLUS
1000.0000 mL | Freq: Once | INTRAVENOUS | Status: AC
  Administered 2022-05-21: 1000 mL via INTRAVENOUS

## 2022-05-21 NOTE — Inpatient Diabetes Management (Signed)
Inpatient Diabetes Program Recommendations  AACE/ADA: New Consensus Statement on Inpatient Glycemic Control (2015)  Target Ranges:  Prepandial:   less than 140 mg/dL      Peak postprandial:   less than 180 mg/dL (1-2 hours)      Critically ill patients:  140 - 180 mg/dL   Lab Results  Component Value Date   GLUCAP 270 (H) 05/21/2022   HGBA1C 6.3 (H) 03/25/2022    Review of Glycemic Control  Latest Reference Range & Units 05/21/22 08:12 05/21/22 11:20  Glucose-Capillary 70 - 99 mg/dL 242 (H) 270 (H)  (H): Data is abnormally high Diabetes history: Type 1 DM  Outpatient Diabetes medications:  Omnipod (uses approx. 50 units daily),  Tresiba 50 units daily (when off pump) Current orders for Inpatient glycemic control:  none  Inpatient Diabetes Program Recommendations:    Noted patient had paused insulin pump this AM due to fear of hypoglycemia. With current trends of 2550 mg/dL and current labs would recommend restarting insulin pump.   Consider adding insulin pump order set Q4H.  Discussed with PA.   Thanks, Bronson Curb, MSN, RNC-OB Diabetes Coordinator [phone] (8a-5p)

## 2022-05-21 NOTE — Discharge Instructions (Addendum)
I have written a prescription for rectal suppositories of Phenergan.  Please use as prescribed.  Make sure to keep your follow-up appointment today with the gastroenterologist  Return for new or worsening symptoms

## 2022-05-21 NOTE — ED Notes (Signed)
Pt's CBG result was 242. Informed [name].

## 2022-05-21 NOTE — ED Notes (Signed)
Pt's CBG result was 270. Informed Zenia Resides - RN.

## 2022-05-21 NOTE — ED Provider Notes (Signed)
[facility] EMERGENCY DEPT Provider Note   CSN: [id number] Arrival date & time: 05/21/22  6433    History  Chief Complaint  Patient presents with   Abdominal Pain   Emesis    [name] is a 51 y.o. female hd of IDDM, gastroparesis freq recent admission for recurrent intractable emesis, esophagitis, cardiac arrest 04/2022  here for evaluation of emesis. Emesis since 2AM yesterday. Pain to epigastric region. Does not radiate into back. PO Phenergan and reglan, PPI without relief. No rectal meds. Allergy to Zofran odt. Cannot keep down home meds, including home BP meds. Turned off insulin pump due to concern for hypoglycemia with not tolerating PO intake. No CP, SOB, back pain, le swelling, cough. Initial episode with dark emesis. No bright red blood. No melena or BRBPR.   [facility] while previously inpatient   Trying to establish care with [facility] today at 4 PM  HPI     Home Medications Prior to Admission medications   Medication Sig Start Date End Date Taking? Authorizing Provider  promethazine (PHENERGAN) 25 MG suppository Place 1 suppository (25 mg total) rectally every 6 (six) hours as needed for nausea or vomiting. 05/21/22  Yes Traniyah Hallett A, PA-C  acetaminophen (TYLENOL) 500 MG tablet Take 1,000 mg by mouth 3 (three) times daily as needed for moderate pain.    [provider]  albuterol (PROAIR HFA) 108 (90 Base) MCG/ACT inhaler Inhale 2 puffs into the lungs every 6 (six) hours as needed for wheezing or shortness of breath. 07/03/20   [name], PA-C  Carboxymethylcellul-Glycerin (LUBRICATING EYE DROPS OP) Place 1-2 drops into both eyes daily as needed (dry eyes).    [provider]  Continuous Blood Gluc Sensor (FREESTYLE LIBRE 2 SENSOR) MISC Inject 1 Device into the skin every 14 (fourteen) days. 03/20/20   [provider]  Ensure Max Protein (ENSURE MAX PROTEIN) LIQD Take 330 mLs (11 oz total) by mouth daily. 04/30/22    Hongalgi, [name], MD  Insulin Disposable Pump (OMNIPOD 10 PACK) MISC by Does not apply route.    [provider]  LORazepam (ATIVAN) 0.5 MG tablet Take 0.5 mg by mouth daily as needed. 05/11/22   [provider]  metoCLOPramide (REGLAN) 10 MG tablet Take 1 tablet (10 mg total) by mouth 3 (three) times daily before meals. 04/29/22   Hongalgi, [name], MD  metoprolol succinate (TOPROL XL) 25 MG 24 hr tablet Take 1 tablet (25 mg total) by mouth daily. 04/19/22   [name], PA-C  Multiple Vitamin (MULTIVITAMIN WITH MINERALS) TABS tablet Take 1 tablet by mouth daily. 04/30/22   Hongalgi, [name], MD  NOVOLOG 100 UNIT/ML injection Inject 0-120 Units into the skin daily. Per sliding scale per patient. Patient states it is an insulin pump 04/06/20   [provider]  pantoprazole (PROTONIX) 40 MG tablet Take 1 tablet (40 mg total) by mouth 2 (two) times daily before a meal. 04/29/22 06/28/22  Hongalgi, [name], MD  polyethylene glycol (MIRALAX / GLYCOLAX) 17 g packet Take 17 g by mouth daily as needed for moderate constipation.    [provider]  pregabalin (LYRICA) 50 MG capsule Take 50 mg by mouth 2 (two) times daily as needed (neuropathy). 12/05/21   [provider]  prochlorperazine (COMPAZINE) 10 MG tablet Take 1 tablet (10 mg total) by mouth every 8 (eight) hours as needed for nausea or vomiting. 04/29/22   Hongalgi, [name], MD  rosuvastatin (CRESTOR) 20 MG  tablet Take 1 tablet (20 mg total) by mouth daily. 04/12/22 05/13/22  Darliss Cheney, MD  tobramycin (TOBREX) 0.3 % ophthalmic solution Place 1-2 drops into both eyes See admin instructions. Instill 1-2 drops into both eyes 3 times daily the day before, day of, and the day after every 6 weeks eye injections per patient 11/17/20   [provider]  torsemide (DEMADEX) 20 MG tablet Take 20 mg by mouth daily as needed (swelling). 12/25/21   [provider]  TRESIBA FLEXTOUCH 200 UNIT/ML  FlexTouch Pen Inject 50 Units into the skin daily as needed (when insulin pump stops working). 11/04/21   [provider]      Allergies    Atorvastatin, Dulaglutide, Zofran [ondansetron], Amoxicillin, Lantus [insulin glargine], and Miconazole nitrate    Review of Systems   Review of Systems  Constitutional: Negative.   HENT: Negative.    Respiratory: Negative.    Cardiovascular: Negative.   Gastrointestinal:  Positive for abdominal pain, nausea and vomiting. Negative for abdominal distention, anal bleeding, blood in stool, constipation, diarrhea and rectal pain.  Genitourinary: Negative.   Musculoskeletal: Negative.   Skin: Negative.   Neurological: Negative.   All other systems reviewed and are negative.   Physical Exam Updated Vital Signs BP (!) 144/88   Pulse 98   Temp 98.3 F (36.8 C) (Oral)   Resp 19   Ht 6' (1.829 m)   Wt 75.8 kg   LMP 11/17/2021 (Approximate)   SpO2 96%   BMI 22.65 kg/m  Physical Exam Vitals and nursing note reviewed.  Constitutional:      General: She is not in acute distress.    Appearance: She is well-developed. She is not ill-appearing, toxic-appearing or diaphoretic.  HENT:     Head: Normocephalic and atraumatic.     Mouth/Throat:     Mouth: Mucous membranes are moist.  Eyes:     Pupils: Pupils are equal, round, and reactive to light.  Cardiovascular:     Rate and Rhythm: Normal rate.     Pulses: Normal pulses.          Radial pulses are 2+ on the right side and 2+ on the left side.       Dorsalis pedis pulses are 2+ on the right side and 2+ on the left side.     Heart sounds: Normal heart sounds.  Pulmonary:     Effort: Pulmonary effort is normal. No respiratory distress.     Breath sounds: Normal breath sounds.  Abdominal:     General: Bowel sounds are normal. There is no distension.     Palpations: Abdomen is soft.     Tenderness: There is abdominal tenderness in the epigastric area.     Hernia: No hernia is present.      Comments: Milt tenderness to epigastric region. Neg Murphy sign. No rebound or guarding.  Musculoskeletal:        General: Normal range of motion.     Cervical back: Normal range of motion.     Comments: No LE edema  Skin:    General: Skin is warm and dry.  Neurological:     General: No focal deficit present.     Mental Status: She is alert.  Psychiatric:        Mood and Affect: Mood normal.    ED Results / Procedures / Treatments   Labs (all labs ordered are listed, but only abnormal results are displayed) Labs Reviewed  CBC WITH DIFFERENTIAL/PLATELET - Abnormal;  Notable for the following components:      Result Value   RBC 3.76 (*)    Hemoglobin 10.4 (*)    HCT 31.7 (*)    All other components within normal limits  BASIC METABOLIC PANEL - Abnormal; Notable for the following components:   Glucose, Bld 303 (*)    BUN 28 (*)    Creatinine, Ser 2.88 (*)    GFR, Estimated 19 (*)    Anion gap 16 (*)    All other components within normal limits  HEPATIC FUNCTION PANEL - Abnormal; Notable for the following components:   AST 12 (*)    All other components within normal limits  BASIC METABOLIC PANEL - Abnormal; Notable for the following components:   Sodium 146 (*)    Glucose, Bld 286 (*)    BUN 28 (*)    Creatinine, Ser 2.69 (*)    Calcium 8.8 (*)    GFR, Estimated 21 (*)    All other components within normal limits  CBG MONITORING, ED - Abnormal; Notable for the following components:   Glucose-Capillary 242 (*)    All other components within normal limits  CBG MONITORING, ED - Abnormal; Notable for the following components:   Glucose-Capillary 270 (*)    All other components within normal limits  CBG MONITORING, ED - Abnormal; Notable for the following components:   Glucose-Capillary 212 (*)    All other components within normal limits  TROPONIN I (HIGH SENSITIVITY) - Abnormal; Notable for the following components:   Troponin I (High Sensitivity) 29 (*)    All other  components within normal limits  TROPONIN I (HIGH SENSITIVITY) - Abnormal; Notable for the following components:   Troponin I (High Sensitivity) 32 (*)    All other components within normal limits  RESP PANEL BY RT-PCR (FLU A&B, COVID) [id number]    EKG EKG Interpretation  Date/Time:  Tuesday May 21 2022 08:29:23 EDT Ventricular Rate:  124 PR Interval:  134 QRS Duration: 70 QT Interval:  326 QTC Calculation: 468 R Axis:   69 Text Interpretation: Sinus tachycardia T wave abnormality, consider inferolateral ischemia Abnormal ECG When compared with ECG of 12-May-2022 22:33, PREVIOUS ECG IS PRESENT Confirmed by [name] ([id number]) on 05/21/2022 8:32:22 AM  Radiology DG Abdomen 1 View  Result Date: 05/21/2022 CLINICAL DATA:  Abdominal pain, nausea, vomiting EXAM: ABDOMEN - 1 VIEW COMPARISON:  04/26/2022 FINDINGS: Bowel gas pattern is nonspecific. Small amount of stool is seen in colon. There is no fecal impaction in rectum. No abnormal masses or calcifications are seen. Kidneys are partly obscured by bowel contents. Phleboliths are seen in pelvis. IMPRESSION: No radiographic abnormalities are seen in abdomen. Electronically Signed   By: Elmer Picker M.D.   On: 05/21/2022 09:49   DG Chest Portable 1 View  Result Date: 05/21/2022 CLINICAL DATA:  51 year old female with history of abdominal pain and emesis. Nausea. EXAM: PORTABLE CHEST 1 VIEW COMPARISON:  Chest x-ray 04/21/2022. FINDINGS: Lung volumes are normal. No consolidative airspace disease. No pleural effusions. No pneumothorax. No pulmonary nodule or mass noted. Pulmonary vasculature and the cardiomediastinal silhouette are within normal limits. Left-sided pacemaker/AICD with lead tip projecting over the expected location of the right ventricular apex. IMPRESSION: 1. No radiographic evidence of acute cardiopulmonary disease. Electronically Signed   By: Vinnie Langton M.D.   On: 05/21/2022 08:44    Procedures Procedures     Medications Ordered in ED Medications  insulin pump (has no administration in time range)  famotidine (PEPCID) IVPB 20 mg premix (0 mg Intravenous Stopped 05/21/22 1045)  pantoprazole (PROTONIX) injection 40 mg (40 mg Intravenous Given 05/21/22 0910)  metoCLOPramide (REGLAN) injection 10 mg (10 mg Intravenous Given 05/21/22 0910)  lactated ringers bolus 1,000 mL (0 mLs Intravenous Stopped 05/21/22 1045)  labetalol (NORMODYNE) injection 5 mg (5 mg Intravenous Given 05/21/22 0946)  droperidol (INAPSINE) 2.5 MG/ML injection 2.5 mg (2.5 mg Intravenous Given 05/21/22 1136)   ED Course/ Medical Decision Making/ A&P    51 year old here for evaluation of generalized abdominal pain, nausea and vomiting.  History of type 1 diabetes history of gastroparesis with frequent admissions for similar.  1 episode of dark emesis however this is resolved.  No melena blood per rectum.  Unable to keep down meds.  Does not look clinically fluid overloaded.  Does look dry.  No chest pain or shortness of breath.  No back pain.  No chronic NSAID use.  She does have appointment [facility] to establish care this afternoon.  No change in bowel movements.   Labs and imaging personally viewed and interpreted:  CBC without leukocytosis, hemoglobin 10.4, up from baseline Metabolic panel glucose 119, creatinine 2.88, mild increase from baseline, anion gap 16>> gap closure, improved creatinine to baseline Hepatic function panel within normal limits COVID, flu negative Troponin 29, similar to prior Chest x-ray without cardiomegaly, pulm edema, pneumothorax, infiltrates X-ray abdomen without air-fluid level EKG without ischemic changes  Patient reassessed. Some dry heaving. Will give droperidol she states this has worked previously for her.  Patient reassessed.  I discussed her labs and imaging.  She feels significantly better after droperidol.  She is tolerating p.o. intake.  Discussed admission versus DC home.  She  prefers to be discharged home as she does have appointment at 4 PM with [facility] to establish care for second opinion of her persistent nausea and vomiting.  I feels is reasonable.  I did repeat her metabolic panel which shows closure of her gap.  Kidney function improved to her baseline.  Does have some mild hyperglycemia to 286, she took her insulin pump off.  Recheck was 212.  Do not feel she needs bolus of insulin at this time.  She will restart her insulin pump.  Low suspicion for DKA, HHS cute intra-abdominal process such as appendicitis, bowel obstruction, bowel perforation, cholecystitis, diverticulitis, PID, AAA, dissection or ectopic pregnancy.  Patient discharged home with symptomatic treatment and given strict instructions for follow-up with their primary care physician.  Did write for some rectal Phenergan to see if this will help as she typically cannot take p.o. antiemetics when she starts vomiting.  Blood pressure improved here to 144/88.  Suspect her earlier hypertension likely due to inability to keep down her p.o. BP meds.  Low suspicion for hypertensive urgency or emergency  The patient has been appropriately medically screened and/or stabilized in the ED. I have low suspicion for any other emergent medical condition which would require further screening, evaluation or treatment in the ED or require inpatient management.  Patient is hemodynamically stable and in no acute distress.  Patient able to ambulate in department prior to ED.  Evaluation does not show acute pathology that would require ongoing or additional emergent interventions while in the emergency department or further inpatient treatment.  I have discussed the diagnosis with the patient and answered all questions.  Pain is been managed while in the emergency department and patient has no further complaints prior to discharge.  Patient  is comfortable with plan discussed in room and is stable for discharge at this time.  I have  discussed strict return precautions for returning to the emergency department.  Patient was encouraged to follow-up with PCP/specialist refer to at discharge.                            Medical Decision Making Amount and/or Complexity of Data Reviewed Independent Historian: friend External Data Reviewed: labs, radiology, ECG and notes. Labs: ordered. Decision-making details documented in ED Course. Radiology: ordered and independent interpretation performed. Decision-making details documented in ED Course. ECG/medicine tests: ordered and independent interpretation performed. Decision-making details documented in ED Course.  Risk OTC drugs. Prescription drug management. Parenteral controlled substances. Decision regarding hospitalization. Diagnosis or treatment significantly limited by social determinants of health.         Final Clinical Impression(s) / ED Diagnoses Final diagnoses:  Nausea and vomiting, unspecified vomiting type  Hyperglycemia  Gastroparesis    Rx / DC Orders ED Discharge Orders          Ordered    promethazine (PHENERGAN) 25 MG suppository  Every 6 hours PRN        05/21/22 1250              Nehemiah Montee A, PA-C 05/21/22 1308    [name], MD 05/21/22 2135

## 2022-05-21 NOTE — ED Triage Notes (Signed)
Pt presents POV from home   Pt states, "I'm having gastroparesis, Ive been here every week x2 months for the same."   Pt took Reglan prior to arrival with no relief  Pt states she has her first appointment at [place] today with gastroenterologist.   Pt reports abd discomfort from vomiting. Pt given emesis bag on arrival. Pt not actively vomiting at this time

## 2022-05-22 ENCOUNTER — Encounter: Payer: Self-pay | Admitting: Emergency Medicine

## 2022-05-22 DIAGNOSIS — R000 Tachycardia, unspecified: Secondary | ICD-10-CM | POA: Diagnosis not present

## 2022-05-22 DIAGNOSIS — J45909 Unspecified asthma, uncomplicated: Secondary | ICD-10-CM | POA: Diagnosis not present

## 2022-05-22 DIAGNOSIS — Z8249 Family history of ischemic heart disease and other diseases of the circulatory system: Secondary | ICD-10-CM | POA: Diagnosis not present

## 2022-05-22 DIAGNOSIS — K3184 Gastroparesis: Secondary | ICD-10-CM | POA: Diagnosis not present

## 2022-05-22 DIAGNOSIS — E1043 Type 1 diabetes mellitus with diabetic autonomic (poly)neuropathy: Secondary | ICD-10-CM | POA: Diagnosis not present

## 2022-05-22 DIAGNOSIS — E876 Hypokalemia: Secondary | ICD-10-CM | POA: Diagnosis present

## 2022-05-22 DIAGNOSIS — Z818 Family history of other mental and behavioral disorders: Secondary | ICD-10-CM | POA: Diagnosis not present

## 2022-05-22 DIAGNOSIS — N184 Chronic kidney disease, stage 4 (severe): Secondary | ICD-10-CM | POA: Diagnosis not present

## 2022-05-22 DIAGNOSIS — I5032 Chronic diastolic (congestive) heart failure: Secondary | ICD-10-CM | POA: Diagnosis not present

## 2022-05-22 DIAGNOSIS — R112 Nausea with vomiting, unspecified: Secondary | ICD-10-CM | POA: Diagnosis not present

## 2022-05-22 DIAGNOSIS — Z9641 Presence of insulin pump (external) (internal): Secondary | ICD-10-CM | POA: Diagnosis not present

## 2022-05-22 DIAGNOSIS — Z833 Family history of diabetes mellitus: Secondary | ICD-10-CM | POA: Diagnosis not present

## 2022-05-22 DIAGNOSIS — K209 Esophagitis, unspecified without bleeding: Secondary | ICD-10-CM | POA: Diagnosis not present

## 2022-05-22 DIAGNOSIS — K922 Gastrointestinal hemorrhage, unspecified: Secondary | ICD-10-CM | POA: Diagnosis present

## 2022-05-22 DIAGNOSIS — Z95810 Presence of automatic (implantable) cardiac defibrillator: Secondary | ICD-10-CM

## 2022-05-22 DIAGNOSIS — E1022 Type 1 diabetes mellitus with diabetic chronic kidney disease: Secondary | ICD-10-CM | POA: Diagnosis present

## 2022-05-22 DIAGNOSIS — K2901 Acute gastritis with bleeding: Secondary | ICD-10-CM | POA: Diagnosis present

## 2022-05-22 DIAGNOSIS — N179 Acute kidney failure, unspecified: Secondary | ICD-10-CM | POA: Diagnosis not present

## 2022-05-22 DIAGNOSIS — D62 Acute posthemorrhagic anemia: Secondary | ICD-10-CM | POA: Diagnosis present

## 2022-05-22 DIAGNOSIS — N1832 Chronic kidney disease, stage 3b: Secondary | ICD-10-CM | POA: Diagnosis not present

## 2022-05-22 DIAGNOSIS — K2900 Acute gastritis without bleeding: Principal | ICD-10-CM | POA: Diagnosis present

## 2022-05-22 DIAGNOSIS — K2100 Gastro-esophageal reflux disease with esophagitis, without bleeding: Secondary | ICD-10-CM | POA: Diagnosis present

## 2022-05-22 DIAGNOSIS — E870 Hyperosmolality and hypernatremia: Secondary | ICD-10-CM | POA: Diagnosis not present

## 2022-05-22 DIAGNOSIS — K449 Diaphragmatic hernia without obstruction or gangrene: Secondary | ICD-10-CM | POA: Diagnosis not present

## 2022-05-22 DIAGNOSIS — Z794 Long term (current) use of insulin: Secondary | ICD-10-CM

## 2022-05-22 DIAGNOSIS — I130 Hypertensive heart and chronic kidney disease with heart failure and stage 1 through stage 4 chronic kidney disease, or unspecified chronic kidney disease: Secondary | ICD-10-CM | POA: Diagnosis present

## 2022-05-22 DIAGNOSIS — F32A Depression, unspecified: Secondary | ICD-10-CM | POA: Diagnosis present

## 2022-05-22 DIAGNOSIS — I160 Hypertensive urgency: Secondary | ICD-10-CM | POA: Diagnosis not present

## 2022-05-22 DIAGNOSIS — Z888 Allergy status to other drugs, medicaments and biological substances status: Secondary | ICD-10-CM

## 2022-05-22 DIAGNOSIS — R109 Unspecified abdominal pain: Secondary | ICD-10-CM | POA: Diagnosis not present

## 2022-05-22 DIAGNOSIS — F419 Anxiety disorder, unspecified: Secondary | ICD-10-CM | POA: Diagnosis present

## 2022-05-22 DIAGNOSIS — K920 Hematemesis: Secondary | ICD-10-CM | POA: Diagnosis present

## 2022-05-22 DIAGNOSIS — Z8349 Family history of other endocrine, nutritional and metabolic diseases: Secondary | ICD-10-CM

## 2022-05-22 DIAGNOSIS — R933 Abnormal findings on diagnostic imaging of other parts of digestive tract: Secondary | ICD-10-CM | POA: Diagnosis not present

## 2022-05-22 DIAGNOSIS — Z880 Allergy status to penicillin: Secondary | ICD-10-CM

## 2022-05-22 LAB — COMPREHENSIVE METABOLIC PANEL
ALT: 8 U/L (ref 0–44)
AST: 13 U/L — ABNORMAL LOW (ref 15–41)
Albumin: 3 g/dL — ABNORMAL LOW (ref 3.5–5.0)
Alkaline Phosphatase: 74 U/L (ref 38–126)
Anion gap: 6 (ref 5–15)
BUN: 26 mg/dL — ABNORMAL HIGH (ref 6–20)
CO2: 27 mmol/L (ref 22–32)
Calcium: 8.4 mg/dL — ABNORMAL LOW (ref 8.9–10.3)
Chloride: 109 mmol/L (ref 98–111)
Creatinine, Ser: 3.08 mg/dL — ABNORMAL HIGH (ref 0.44–1.00)
GFR, Estimated: 18 mL/min — ABNORMAL LOW (ref >60)
Glucose, Bld: 157 mg/dL — ABNORMAL HIGH (ref 70–99)
Potassium: 3.6 mmol/L (ref 3.5–5.1)
Sodium: 142 mmol/L (ref 135–145)
Total Bilirubin: 0.9 mg/dL (ref 0.3–1.2)
Total Protein: 6.9 g/dL (ref 6.5–8.1)

## 2022-05-22 LAB — LIPASE, BLOOD: Lipase: 23 U/L (ref 11–51)

## 2022-05-22 LAB — CBC
HCT: 30.5 % — ABNORMAL LOW (ref 36.0–46.0)
Hemoglobin: 9.5 g/dL — ABNORMAL LOW (ref 12.0–15.0)
MCH: 27 pg (ref 26.0–34.0)
MCHC: 31.1 g/dL (ref 30.0–36.0)
MCV: 86.6 fL (ref 80.0–100.0)
Platelets: 299 10*3/uL (ref 150–400)
RBC: 3.52 MIL/uL — ABNORMAL LOW (ref 3.87–5.11)
RDW: 14.1 % (ref 11.5–15.5)
WBC: 7.1 10*3/uL (ref 4.0–10.5)
nRBC: 0 % (ref 0.0–0.2)

## 2022-05-22 LAB — CBG MONITORING, ED: Glucose-Capillary: 157 mg/dL — ABNORMAL HIGH (ref 70–99)

## 2022-05-22 NOTE — ED Triage Notes (Signed)
Pt presents via POV with complaints of abdominal pain with associated dizziness, nausea, and vomiting that started 4 weeks ago - pt was seen at [facility] yesterday for same. Pt states she has gastroparesis and hasn't had any relief. Per family friend the patient hadn't eaten for the last 4-5 days due to the pain. Pt is a T1DM and has an insulin pump in place. Of note, the patient was seen by a GI specialist yesterday who told her that her "case was too challenging" for the NP and to follow up with another physician. Denies SOB.    [id number]

## 2022-05-23 ENCOUNTER — Emergency Department: Payer: BC Managed Care – PPO

## 2022-05-23 ENCOUNTER — Inpatient Hospital Stay
Admission: EM | Admit: 2022-05-23 | Discharge: 2022-05-24 | DRG: 073 | Disposition: A | Discharge status: Home / Self Care | Payer: BC Managed Care – PPO | Attending: Internal Medicine | Admitting: Internal Medicine

## 2022-05-23 ENCOUNTER — Encounter: Payer: Self-pay | Admitting: Family Medicine

## 2022-05-23 ENCOUNTER — Inpatient Hospital Stay: Payer: BC Managed Care – PPO | Admitting: Anesthesiology

## 2022-05-23 ENCOUNTER — Other Ambulatory Visit: Payer: Self-pay

## 2022-05-23 ENCOUNTER — Encounter
Admission: EM | Disposition: A | Discharge status: Home / Self Care | Payer: Self-pay | Source: Home / Self Care | Attending: Internal Medicine

## 2022-05-23 DIAGNOSIS — K2900 Acute gastritis without bleeding: Secondary | ICD-10-CM | POA: Diagnosis not present

## 2022-05-23 DIAGNOSIS — F419 Anxiety disorder, unspecified: Secondary | ICD-10-CM | POA: Diagnosis present

## 2022-05-23 DIAGNOSIS — E870 Hyperosmolality and hypernatremia: Secondary | ICD-10-CM | POA: Diagnosis present

## 2022-05-23 DIAGNOSIS — K920 Hematemesis: Secondary | ICD-10-CM | POA: Diagnosis present

## 2022-05-23 DIAGNOSIS — E1022 Type 1 diabetes mellitus with diabetic chronic kidney disease: Secondary | ICD-10-CM | POA: Diagnosis present

## 2022-05-23 DIAGNOSIS — K3184 Gastroparesis: Secondary | ICD-10-CM

## 2022-05-23 DIAGNOSIS — N179 Acute kidney failure, unspecified: Secondary | ICD-10-CM | POA: Diagnosis present

## 2022-05-23 DIAGNOSIS — I5032 Chronic diastolic (congestive) heart failure: Secondary | ICD-10-CM | POA: Diagnosis present

## 2022-05-23 DIAGNOSIS — Z8349 Family history of other endocrine, nutritional and metabolic diseases: Secondary | ICD-10-CM | POA: Diagnosis not present

## 2022-05-23 DIAGNOSIS — E109 Type 1 diabetes mellitus without complications: Secondary | ICD-10-CM

## 2022-05-23 DIAGNOSIS — E876 Hypokalemia: Secondary | ICD-10-CM | POA: Diagnosis present

## 2022-05-23 DIAGNOSIS — Z794 Long term (current) use of insulin: Secondary | ICD-10-CM | POA: Diagnosis not present

## 2022-05-23 DIAGNOSIS — E1043 Type 1 diabetes mellitus with diabetic autonomic (poly)neuropathy: Secondary | ICD-10-CM | POA: Diagnosis present

## 2022-05-23 DIAGNOSIS — K922 Gastrointestinal hemorrhage, unspecified: Secondary | ICD-10-CM | POA: Diagnosis present

## 2022-05-23 DIAGNOSIS — K2901 Acute gastritis with bleeding: Secondary | ICD-10-CM

## 2022-05-23 DIAGNOSIS — N184 Chronic kidney disease, stage 4 (severe): Secondary | ICD-10-CM

## 2022-05-23 DIAGNOSIS — Z888 Allergy status to other drugs, medicaments and biological substances status: Secondary | ICD-10-CM | POA: Diagnosis not present

## 2022-05-23 DIAGNOSIS — N1832 Chronic kidney disease, stage 3b: Secondary | ICD-10-CM | POA: Diagnosis not present

## 2022-05-23 DIAGNOSIS — F32A Depression, unspecified: Secondary | ICD-10-CM | POA: Diagnosis present

## 2022-05-23 DIAGNOSIS — I130 Hypertensive heart and chronic kidney disease with heart failure and stage 1 through stage 4 chronic kidney disease, or unspecified chronic kidney disease: Secondary | ICD-10-CM | POA: Diagnosis present

## 2022-05-23 DIAGNOSIS — J45909 Unspecified asthma, uncomplicated: Secondary | ICD-10-CM | POA: Diagnosis present

## 2022-05-23 DIAGNOSIS — Z818 Family history of other mental and behavioral disorders: Secondary | ICD-10-CM | POA: Diagnosis not present

## 2022-05-23 DIAGNOSIS — Z9641 Presence of insulin pump (external) (internal): Secondary | ICD-10-CM | POA: Diagnosis present

## 2022-05-23 DIAGNOSIS — R112 Nausea with vomiting, unspecified: Secondary | ICD-10-CM

## 2022-05-23 DIAGNOSIS — K2100 Gastro-esophageal reflux disease with esophagitis, without bleeding: Secondary | ICD-10-CM | POA: Diagnosis present

## 2022-05-23 DIAGNOSIS — E1143 Type 2 diabetes mellitus with diabetic autonomic (poly)neuropathy: Secondary | ICD-10-CM

## 2022-05-23 DIAGNOSIS — I160 Hypertensive urgency: Secondary | ICD-10-CM | POA: Diagnosis present

## 2022-05-23 DIAGNOSIS — R933 Abnormal findings on diagnostic imaging of other parts of digestive tract: Secondary | ICD-10-CM

## 2022-05-23 DIAGNOSIS — D62 Acute posthemorrhagic anemia: Secondary | ICD-10-CM | POA: Diagnosis present

## 2022-05-23 DIAGNOSIS — Z8249 Family history of ischemic heart disease and other diseases of the circulatory system: Secondary | ICD-10-CM | POA: Diagnosis not present

## 2022-05-23 DIAGNOSIS — Z833 Family history of diabetes mellitus: Secondary | ICD-10-CM | POA: Diagnosis not present

## 2022-05-23 HISTORY — DX: Type 1 diabetes mellitus without complications: E10.9

## 2022-05-23 HISTORY — PX: ESOPHAGOGASTRODUODENOSCOPY (EGD) WITH PROPOFOL: SHX5813

## 2022-05-23 LAB — URINALYSIS, ROUTINE W REFLEX MICROSCOPIC
Bacteria, UA: NONE SEEN
Bilirubin Urine: NEGATIVE
Glucose, UA: 50 mg/dL — AB
Hgb urine dipstick: NEGATIVE
Ketones, ur: NEGATIVE mg/dL
Leukocytes,Ua: NEGATIVE
Nitrite: NEGATIVE
Protein, ur: >=300 mg/dL — AB
Specific Gravity, Urine: 1.019 (ref 1.005–1.030)
pH: 7 (ref 5.0–8.0)

## 2022-05-23 LAB — TROPONIN I (HIGH SENSITIVITY)
Troponin I (High Sensitivity): 37 ng/L — ABNORMAL HIGH (ref <18)
Troponin I (High Sensitivity): 38 ng/L — ABNORMAL HIGH (ref <18)

## 2022-05-23 LAB — PREGNANCY, URINE: Preg Test, Ur: NEGATIVE

## 2022-05-23 LAB — CBC
HCT: 27.8 % — ABNORMAL LOW (ref 36.0–46.0)
Hemoglobin: 8.8 g/dL — ABNORMAL LOW (ref 12.0–15.0)
MCH: 27.4 pg (ref 26.0–34.0)
MCHC: 31.7 g/dL (ref 30.0–36.0)
MCV: 86.6 fL (ref 80.0–100.0)
Platelets: 313 10*3/uL (ref 150–400)
RBC: 3.21 MIL/uL — ABNORMAL LOW (ref 3.87–5.11)
RDW: 14.3 % (ref 11.5–15.5)
WBC: 7.3 10*3/uL (ref 4.0–10.5)
nRBC: 0 % (ref 0.0–0.2)

## 2022-05-23 LAB — CBG MONITORING, ED
Glucose-Capillary: 141 mg/dL — ABNORMAL HIGH (ref 70–99)
Glucose-Capillary: 120 mg/dL — ABNORMAL HIGH (ref 70–99)
Glucose-Capillary: 93 mg/dL (ref 70–99)
Glucose-Capillary: 113 mg/dL — ABNORMAL HIGH (ref 70–99)
Glucose-Capillary: 61 mg/dL — ABNORMAL LOW (ref 70–99)

## 2022-05-23 LAB — BASIC METABOLIC PANEL
Anion gap: 7 (ref 5–15)
BUN: 26 mg/dL — ABNORMAL HIGH (ref 6–20)
CO2: 27 mmol/L (ref 22–32)
Calcium: 8.8 mg/dL — ABNORMAL LOW (ref 8.9–10.3)
Chloride: 113 mmol/L — ABNORMAL HIGH (ref 98–111)
Creatinine, Ser: 3.07 mg/dL — ABNORMAL HIGH (ref 0.44–1.00)
GFR, Estimated: 18 mL/min — ABNORMAL LOW (ref >60)
Glucose, Bld: 108 mg/dL — ABNORMAL HIGH (ref 70–99)
Potassium: 3.7 mmol/L (ref 3.5–5.1)
Sodium: 147 mmol/L — ABNORMAL HIGH (ref 135–145)

## 2022-05-23 LAB — GLUCOSE, CAPILLARY: Glucose-Capillary: 102 mg/dL — ABNORMAL HIGH (ref 70–99)

## 2022-05-23 SURGERY — ESOPHAGOGASTRODUODENOSCOPY (EGD) WITH PROPOFOL
Anesthesia: General

## 2022-05-23 MED ORDER — ACETAMINOPHEN 650 MG RE SUPP: 650.0000 mg | Freq: Four times a day (QID) | RECTAL | Status: DC | PRN

## 2022-05-23 MED ORDER — GLYCOPYRROLATE 0.2 MG/ML IJ SOLN
INTRAMUSCULAR | Status: DC | PRN
  Administered 2022-05-23: .2 mg via INTRAVENOUS

## 2022-05-23 MED ORDER — INSULIN DEGLUDEC 200 UNIT/ML ~~LOC~~ SOPN: PEN_INJECTOR | Freq: Every day | SUBCUTANEOUS | Status: DC | PRN

## 2022-05-23 MED ORDER — MORPHINE SULFATE (PF) 4 MG/ML IV SOLN
4.0000 mg | Freq: Once | INTRAVENOUS | Status: AC
  Administered 2022-05-23: 4 mg via INTRAVENOUS
  Filled 2022-05-23: qty 1

## 2022-05-23 MED ORDER — INSULIN ASPART 100 UNIT/ML IJ SOLN: 0.0000 [IU] | Freq: Every day | INTRAMUSCULAR | Status: DC

## 2022-05-23 MED ORDER — ADULT MULTIVITAMIN W/MINERALS CH
1.0000 | ORAL_TABLET | Freq: Every day | ORAL | Status: DC
  Administered 2022-05-24: 1 via ORAL
  Filled 2022-05-23 (×2): qty 1

## 2022-05-23 MED ORDER — DEXTROSE 5 % IV SOLN: INTRAVENOUS | Status: DC

## 2022-05-23 MED ORDER — INSULIN PUMP
Freq: Three times a day (TID) | SUBCUTANEOUS | Status: DC
  Administered 2022-05-23 – 2022-05-24 (×2): 1 via SUBCUTANEOUS
  Filled 2022-05-23: qty 1

## 2022-05-23 MED ORDER — METOPROLOL SUCCINATE ER 25 MG PO TB24
25.0000 mg | ORAL_TABLET | Freq: Every day | ORAL | Status: DC
  Administered 2022-05-23 – 2022-05-24 (×2): 25 mg via ORAL
  Filled 2022-05-23 (×2): qty 1

## 2022-05-23 MED ORDER — PROPOFOL 10 MG/ML IV BOLUS
INTRAVENOUS | Status: DC | PRN
  Administered 2022-05-23: 150 mg via INTRAVENOUS

## 2022-05-23 MED ORDER — METOCLOPRAMIDE HCL 5 MG/ML IJ SOLN
10.0000 mg | Freq: Four times a day (QID) | INTRAMUSCULAR | Status: DC
  Administered 2022-05-23 – 2022-05-24 (×4): 10 mg via INTRAVENOUS
  Filled 2022-05-23 (×4): qty 2

## 2022-05-23 MED ORDER — PANTOPRAZOLE SODIUM 40 MG IV SOLR: 40.0000 mg | Freq: Two times a day (BID) | INTRAVENOUS | Status: DC

## 2022-05-23 MED ORDER — ROSUVASTATIN CALCIUM 10 MG PO TABS
10.0000 mg | ORAL_TABLET | Freq: Every evening | ORAL | Status: DC
  Filled 2022-05-23: qty 0.5

## 2022-05-23 MED ORDER — GLYCOPYRROLATE 0.2 MG/ML IJ SOLN
INTRAMUSCULAR | Status: AC
  Filled 2022-05-23: qty 1

## 2022-05-23 MED ORDER — METOCLOPRAMIDE HCL 5 MG/ML IJ SOLN
10.0000 mg | Freq: Once | INTRAMUSCULAR | Status: AC
  Administered 2022-05-23: 10 mg via INTRAVENOUS
  Filled 2022-05-23: qty 2

## 2022-05-23 MED ORDER — LORAZEPAM 0.5 MG PO TABS: 0.5000 mg | ORAL_TABLET | Freq: Every day | ORAL | Status: DC | PRN

## 2022-05-23 MED ORDER — PANTOPRAZOLE SODIUM 40 MG IV SOLR
40.0000 mg | Freq: Once | INTRAVENOUS | Status: AC
  Administered 2022-05-23: 40 mg via INTRAVENOUS
  Filled 2022-05-23: qty 10

## 2022-05-23 MED ORDER — ALBUTEROL SULFATE (2.5 MG/3ML) 0.083% IN NEBU: 3.0000 mL | INHALATION_SOLUTION | Freq: Four times a day (QID) | RESPIRATORY_TRACT | Status: DC | PRN

## 2022-05-23 MED ORDER — PROPOFOL 10 MG/ML IV BOLUS
INTRAVENOUS | Status: AC
  Filled 2022-05-23: qty 40

## 2022-05-23 MED ORDER — PROCHLORPERAZINE MALEATE 10 MG PO TABS: 10.0000 mg | ORAL_TABLET | Freq: Three times a day (TID) | ORAL | Status: DC | PRN

## 2022-05-23 MED ORDER — PANTOPRAZOLE 80MG IVPB - SIMPLE MED
80.0000 mg | Freq: Once | INTRAVENOUS | Status: AC
  Administered 2022-05-23: 80 mg via INTRAVENOUS
  Filled 2022-05-23: qty 100

## 2022-05-23 MED ORDER — INSULIN ASPART 100 UNIT/ML IJ SOLN: 0.0000 [IU] | INTRAMUSCULAR | Status: DC

## 2022-05-23 MED ORDER — SODIUM CHLORIDE 0.9 % IV BOLUS (SEPSIS)
1000.0000 mL | Freq: Once | INTRAVENOUS | Status: AC
  Administered 2022-05-23: 1000 mL via INTRAVENOUS

## 2022-05-23 MED ORDER — LIDOCAINE HCL (PF) 2 % IJ SOLN
INTRAMUSCULAR | Status: AC
  Filled 2022-05-23: qty 5

## 2022-05-23 MED ORDER — SUCCINYLCHOLINE CHLORIDE 200 MG/10ML IV SOSY
PREFILLED_SYRINGE | INTRAVENOUS | Status: AC
  Filled 2022-05-23: qty 10

## 2022-05-23 MED ORDER — SODIUM CHLORIDE 0.9 % IV SOLN: INTRAVENOUS | Status: DC

## 2022-05-23 MED ORDER — HYDRALAZINE HCL 20 MG/ML IJ SOLN: 10.0000 mg | Freq: Four times a day (QID) | INTRAMUSCULAR | Status: DC | PRN

## 2022-05-23 MED ORDER — SCOPOLAMINE 1 MG/3DAYS TD PT72
1.0000 | MEDICATED_PATCH | TRANSDERMAL | Status: DC
  Administered 2022-05-24: 1.5 mg via TRANSDERMAL
  Filled 2022-05-23: qty 1

## 2022-05-23 MED ORDER — ACETAMINOPHEN 325 MG PO TABS: 650.0000 mg | ORAL_TABLET | Freq: Four times a day (QID) | ORAL | Status: DC | PRN

## 2022-05-23 MED ORDER — PANTOPRAZOLE INFUSION (NEW) - SIMPLE MED
8.0000 mg/h | INTRAVENOUS | Status: DC
  Administered 2022-05-23 – 2022-05-24 (×3): 8 mg/h via INTRAVENOUS
  Filled 2022-05-23 (×3): qty 100

## 2022-05-23 MED ORDER — METOCLOPRAMIDE HCL 10 MG PO TABS: 10.0000 mg | ORAL_TABLET | Freq: Three times a day (TID) | ORAL | Status: DC

## 2022-05-23 MED ORDER — LACTATED RINGERS IV BOLUS
1000.0000 mL | Freq: Once | INTRAVENOUS | Status: AC
  Administered 2022-05-23: 1000 mL via INTRAVENOUS

## 2022-05-23 MED ORDER — METOCLOPRAMIDE HCL 5 MG/ML IJ SOLN: 10.0000 mg | Freq: Four times a day (QID) | INTRAMUSCULAR | Status: DC

## 2022-05-23 MED ORDER — TRAZODONE HCL 50 MG PO TABS
25.0000 mg | ORAL_TABLET | Freq: Every evening | ORAL | Status: DC | PRN
  Administered 2022-05-23: 25 mg via ORAL
  Filled 2022-05-23: qty 1

## 2022-05-23 MED ORDER — LABETALOL HCL 5 MG/ML IV SOLN
5.0000 mg | Freq: Once | INTRAVENOUS | Status: DC
  Filled 2022-05-23: qty 4

## 2022-05-23 MED ORDER — SUCCINYLCHOLINE CHLORIDE 200 MG/10ML IV SOSY
PREFILLED_SYRINGE | INTRAVENOUS | Status: DC | PRN
  Administered 2022-05-23: 100 mg via INTRAVENOUS

## 2022-05-23 MED ORDER — SODIUM CHLORIDE 0.9 % IV SOLN
25.0000 mg | Freq: Four times a day (QID) | INTRAVENOUS | Status: DC | PRN
  Administered 2022-05-23 (×2): 25 mg via INTRAVENOUS
  Filled 2022-05-23 (×2): qty 1

## 2022-05-23 MED ORDER — DEXTROSE-NACL 5-0.45 % IV SOLN: INTRAVENOUS | Status: AC

## 2022-05-23 MED ORDER — PREGABALIN 50 MG PO CAPS: 50.0000 mg | ORAL_CAPSULE | Freq: Two times a day (BID) | ORAL | Status: DC | PRN

## 2022-05-23 NOTE — Transfer of Care (Signed)
Immediate Anesthesia Transfer of Care Note  Patient: [name]  Procedure(s) Performed: ESOPHAGOGASTRODUODENOSCOPY (EGD) WITH PROPOFOL  Patient Location: Endoscopy Unit  Anesthesia Type:General  Level of Consciousness: awake  Airway & Oxygen Therapy: Patient Spontanous Breathing and Patient connected to face mask oxygen  Post-op Assessment: Report given to RN and Post -op Vital signs reviewed and stable  Post vital signs: Reviewed  Last Vitals:  Vitals Value Taken Time  BP 134/89 05/23/22 1511  Temp    Pulse 82 05/23/22 1511  Resp 14 05/23/22 1511  SpO2 100 % 05/23/22 1511  Vitals shown include unvalidated device data.  Last Pain:  Vitals:   05/23/22 1430  TempSrc: Temporal  PainSc: 0-No pain         Complications: No notable events documented.

## 2022-05-23 NOTE — H&P (Signed)
[place]   PATIENT NAME: [name]    MR#:  [id number]  DATE OF BIRTH:  1971/08/04  DATE OF ADMISSION:  05/23/2022  PRIMARY CARE PHYSICIAN: [name], PA   Patient is coming from: Home  REQUESTING/REFERRING PHYSICIAN: Conni Slipper, MD  CHIEF COMPLAINT:   Chief Complaint  Patient presents with   Emesis    HISTORY OF PRESENT ILLNESS:  [name] is a 51 y.o. F American female with medical history significant for anxiety, asthma, CHF, depression, type I diabetes mellitus on an on pump and diabetic gastroparesis, presented to the emergency room with a [place] of intractable nausea and vomiting since Thursday with associated mild epigastric abdominal pain.  She admitted to coffee-ground emesis in the ER.  She denied any melena or bright red bleeding per rectum.  No fever or chills.  No dysuria, oliguria or hematuria or flank pain.  No cough or wheezing or hemoptysis.  No other bleeding diathesis.  She denies any headache or dizziness or blurred vision.  No chest pain or palpitations.  ED Course: When she came to the ER, BP was 189/98 with a heart rate of 106 and otherwise normal vital signs.  Labs revealed BUN of 26 with a creatinine of 3.08 up from previous levels.  High sensitive troponin I was 37 and later 38 and albumin level was 3.  CBC showed anemia slightly worse than previous levels.Urinalysis showed more than 300 protein and 50 glucose. EKG as reviewed by me : Showed sinus tachycardia with rate of 110 with T wave inversion inferolaterally  Imaging: Abdominal pelvic CT scan revealed the following: 1. Increasingly prominent thickened folds in the proximal to mid stomach. Most likely due to severe gastritis. Endoscopy may be indicated to exclude other etiologies. 2. Small hiatal hernia. 3. Constipation with diverticulosis. 4. Small increased opacity in the anterior base of the anterior basal right lower lobe segment, could be atelectasis or a  small pneumonia. 5. Stable mildly prominent inguinal chain nodes. No new or worsening adenopathy. 6. Umbilical fat hernia.  The patient was ordered IV Protonix bolus and infusion as well as hydration with IV normal saline.  She will be admitted to a progressive unit bed for further evaluation and management. PAST MEDICAL HISTORY:   Past Medical History:  Diagnosis Date   Anemia    Anxiety    Arthritis    Asthma    CHF (congestive heart failure) ([place])    Depression    Diabetic ulcer of left foot ([place]) 05/12/2013   Gastroparesis    Generalized abdominal pain    History of chicken pox    Loss of weight 12/02/2019   Migraines    Mood disorder ([place])    anxiety   Prurigo nodularis    with diabetic dermopathy   Type 1 diabetes, uncontrolled, with neuropathy    [place]   Ulcers of both lower legs ([place]) 02/20/2014    PAST SURGICAL HISTORY:   Past Surgical History:  Procedure Laterality Date   BIOPSY  08/11/2020   Procedure: BIOPSY;  Surgeon: Ladene Artist, MD;  Location: [facility];  Service: Endoscopy;;   BIOPSY  04/02/2022   Procedure: BIOPSY;  Surgeon: Jackquline Denmark, MD;  Location: [facility];  Service: Gastroenterology;;   ESOPHAGOGASTRODUODENOSCOPY (EGD) WITH PROPOFOL N/A 08/11/2020   Procedure: ESOPHAGOGASTRODUODENOSCOPY (EGD) WITH PROPOFOL;  Surgeon: Ladene Artist, MD;  Location: [facility];  Service: Endoscopy;  Laterality: N/A;   ESOPHAGOGASTRODUODENOSCOPY (EGD) WITH PROPOFOL N/A 04/02/2022  Procedure: ESOPHAGOGASTRODUODENOSCOPY (EGD) WITH PROPOFOL;  Surgeon: Jackquline Denmark, MD;  Location: [facility];  Service: Gastroenterology;  Laterality: N/A;   ICD IMPLANT N/A 04/09/2022   Procedure: ICD IMPLANT;  Surgeon: Constance Haw, MD;  Location: [facility];  Service: Cardiovascular;  Laterality: N/A;   treadmill stress test  01/2013   WNL, low risk study    SOCIAL HISTORY:   Social History   Tobacco Use   Smoking status: Never   Smokeless tobacco:  Never  Substance Use Topics   Alcohol use: Not Currently    FAMILY HISTORY:   Family History  Problem Relation Age of Onset   Diabetes Mother        type 2   Hypertension Mother    Thyroid disease Mother    Bipolar disorder Mother    Heart disease Mother    Calcium disorder Mother    Cancer Maternal Grandmother        Breast, stomach   Cancer Paternal Grandmother        stomach, lung (smoker)   Diabetes Paternal Grandmother    Diabetes Paternal Grandfather    Heart failure Sister    Diabetes Sister    Stroke Maternal Aunt    Cancer Maternal Uncle        prostate   CAD Maternal Aunt        stents   Cancer Maternal Aunt 64       ovarian    DRUG ALLERGIES:   Allergies  Allergen Reactions   Atorvastatin Other (See Comments)    Dizziness    Dulaglutide Nausea And Vomiting and Other (See Comments)    Caused pancreatitis    Zofran [Ondansetron] Nausea And Vomiting and Other (See Comments)    Causes nausea vomiting to worsen / doesn't really work for patient.   Amoxicillin Itching and Rash   Lantus [Insulin Glargine] Itching and Rash   Miconazole Nitrate Nausea And Vomiting and Rash    REVIEW OF SYSTEMS:   ROS As per history of present illness. All pertinent systems were reviewed above. Constitutional, HEENT, cardiovascular, respiratory, GI, GU, musculoskeletal, neuro, psychiatric, endocrine, integumentary and hematologic systems were reviewed and are otherwise negative/unremarkable except for positive findings mentioned above in the HPI.   MEDICATIONS AT HOME:   Prior to Admission medications   Medication Sig Start Date End Date Taking? Authorizing Provider  acetaminophen (TYLENOL) 500 MG tablet Take 1,000 mg by mouth 3 (three) times daily as needed for moderate pain.    [provider]  albuterol (PROAIR HFA) 108 (90 Base) MCG/ACT inhaler Inhale 2 puffs into the lungs every 6 (six) hours as needed for wheezing or shortness of breath. 07/03/20   [name], PA-C  Carboxymethylcellul-Glycerin (LUBRICATING EYE DROPS OP) Place 1-2 drops into both eyes daily as needed (dry eyes).    [provider]  Continuous Blood Gluc Sensor (FREESTYLE LIBRE 2 SENSOR) MISC Inject 1 Device into the skin every 14 (fourteen) days. 03/20/20   [provider]  Ensure Max Protein (ENSURE MAX PROTEIN) LIQD Take 330 mLs (11 oz total) by mouth daily. 04/30/22   Hongalgi, [name], MD  Insulin Disposable Pump (OMNIPOD 10 PACK) MISC by Does not apply route.    [provider]  LORazepam (ATIVAN) 0.5 MG tablet Take 0.5 mg by mouth daily as needed. 05/11/22   [provider]  metoCLOPramide (REGLAN) 10 MG tablet Take 1 tablet (10 mg total) by mouth 3 (three) times daily before  meals. 04/29/22   Hongalgi, [name], MD  metoprolol succinate (TOPROL XL) 25 MG 24 hr tablet Take 1 tablet (25 mg total) by mouth daily. 04/19/22   [name], PA-C  Multiple Vitamin (MULTIVITAMIN WITH MINERALS) TABS tablet Take 1 tablet by mouth daily. 04/30/22   Hongalgi, [name], MD  NOVOLOG 100 UNIT/ML injection Inject 0-120 Units into the skin daily. Per sliding scale per patient. Patient states it is an insulin pump 04/06/20   [provider]  pantoprazole (PROTONIX) 40 MG tablet Take 1 tablet (40 mg total) by mouth 2 (two) times daily before a meal. 04/29/22 06/28/22  Hongalgi, [name], MD  polyethylene glycol (MIRALAX / GLYCOLAX) 17 g packet Take 17 g by mouth daily as needed for moderate constipation.    [provider]  pregabalin (LYRICA) 50 MG capsule Take 50 mg by mouth 2 (two) times daily as needed (neuropathy). 12/05/21   [provider]  prochlorperazine (COMPAZINE) 10 MG tablet Take 1 tablet (10 mg total) by mouth every 8 (eight) hours as needed for nausea or vomiting. 04/29/22   Hongalgi, [name], MD  promethazine (PHENERGAN) 25 MG suppository Place 1 suppository (25 mg total) rectally every 6 (six) hours as needed for  nausea or vomiting. 05/21/22   Henderly, Britni A, PA-C  rosuvastatin (CRESTOR) 20 MG tablet Take 1 tablet (20 mg total) by mouth daily. 04/12/22 05/13/22  Darliss Cheney, MD  tobramycin (TOBREX) 0.3 % ophthalmic solution Place 1-2 drops into both eyes See admin instructions. Instill 1-2 drops into both eyes 3 times daily the day before, day of, and the day after every 6 weeks eye injections per patient 11/17/20   [provider]  torsemide (DEMADEX) 20 MG tablet Take 20 mg by mouth daily as needed (swelling). 12/25/21   [provider]  TRESIBA FLEXTOUCH 200 UNIT/ML FlexTouch Pen Inject 50 Units into the skin daily as needed (when insulin pump stops working). 11/04/21   [provider]      VITAL SIGNS:  Blood pressure (!) 148/86, pulse 95, temperature 98.1 F (36.7 C), temperature source Oral, resp. rate 16, height 6' (1.829 m), weight 76 kg, last menstrual period 11/17/2021, SpO2 94 %.  PHYSICAL EXAMINATION:  Physical Exam  GENERAL:  51 y.o.-year-old patient lying in the bed with mild distress from recurrent nausea and vomiting.  EYES: Pupils equal, round, reactive to light and accommodation. No scleral icterus. Extraocular muscles intact.  HEENT: Head atraumatic, normocephalic. Oropharynx and nasopharynx clear.  NECK:  Supple, no jugular venous distention. No thyroid enlargement, no tenderness.  LUNGS: Normal breath sounds bilaterally, no wheezing, rales,rhonchi or crepitation. No use of accessory muscles of respiration.  CARDIOVASCULAR: Regular rate and rhythm, S1, S2 normal. No murmurs, rubs, or gallops.  ABDOMEN: Soft, nondistended, with mild gastric tenderness without rebound tenderness guarding or rigidity.. Bowel sounds present. No organomegaly or mass.  EXTREMITIES: No pedal edema, cyanosis, or clubbing.  NEUROLOGIC: Cranial nerves II through XII are intact. Muscle strength 5/5 in all extremities. Sensation intact. Gait not checked.  PSYCHIATRIC: The patient is  alert and oriented x 3.  Normal affect and good eye contact. SKIN: No obvious rash, lesion, or ulcer.   LABORATORY PANEL:   CBC Recent Labs  Lab 05/23/22 0715  WBC 7.3  HGB 8.8*  HCT 27.8*  PLT 313   ------------------------------------------------------------------------------------------------------------------  Chemistries  Recent Labs  Lab 05/22/22 2257 05/23/22 0715  NA 142 147*  K 3.6 3.7  CL 109 113*  CO2 27 27  GLUCOSE 157* 108*  BUN 26* 26*  CREATININE 3.08* 3.07*  CALCIUM 8.4* 8.8*  AST 13*  --   ALT 8  --   ALKPHOS 74  --   BILITOT 0.9  --    ------------------------------------------------------------------------------------------------------------------  Cardiac Enzymes No results for input(s): "TROPONINI" in the last 168 hours. ------------------------------------------------------------------------------------------------------------------  RADIOLOGY:  CT ABDOMEN PELVIS WO CONTRAST  Result Date: 05/23/2022 CLINICAL DATA:  Abdominal pain, dizziness, nausea and vomiting onset 4 weeks ago. Relates personal history gastroparesis. EXAM: CT ABDOMEN AND PELVIS WITHOUT CONTRAST TECHNIQUE: Multidetector CT imaging of the abdomen and pelvis was performed following the standard protocol without IV contrast. RADIATION DOSE REDUCTION: This exam was performed according to the departmental dose-optimization program which includes automated exposure control, adjustment of the mA and/or kV according to patient size and/or use of iterative reconstruction technique. COMPARISON:  CTA chest, abdomen and pelvis 04/21/2022, CTA abdomen and pelvis 08/24/2020 FINDINGS: Lower chest: There is a small increased opacity anteriorly in the base of the anterior segment of the right lower lobe which could be atelectasis or a small pneumonia. Lung bases are otherwise clear. The cardiac size is normal. Small hiatal hernia. There is spray artifact from pacemaker wiring in the heart. Small  pleural effusions noted on the last CT have resolved since. Hepatobiliary: The liver is unremarkable without contrast. Gallbladder and bile ducts are unremarkable. Pancreas: Unremarkable without contrast. Spleen: Unremarkable without contrast. 1.7 cm splenule again noted anterior to the spleen. Adrenals/Urinary Tract: There is no adrenal mass. No focal abnormality in the unenhanced renal cortex. There is no urinary stone or obstruction, no focal bladder thickening or intravesical stone. Stomach/Bowel: There are increasingly thickened folds in the proximal to mid stomach. This is probably due to severe gastritis but endoscopic follow-up may be warranted to exclude infiltrating disease. There is no dilatation or wall thickening in the small bowel, appendix and large bowel, with moderate fecal stasis again shown and uncomplicated diverticulosis. Vascular/Lymphatic: There is trace aortic calcific plaque, minimal plaque in the right internal iliac artery. There is no AAA. There is no new adenopathy with bilateral scattered mildly prominent inguinal chain nodes stable in comparison. No retroperitoneal adenopathy is seen. Reproductive: Uterus and bilateral adnexa are unremarkable. Other: Small umbilical fat hernia. No incarcerated hernia. There is no free air, free hemorrhage or free fluid. Musculoskeletal: Lumbar spondylosis and slight levoscoliosis. No acute or significant osseous findings. No worrisome regional bone lesion. IMPRESSION: 1. Increasingly prominent thickened folds in the proximal to mid stomach. Most likely due to severe gastritis. Endoscopy may be indicated to exclude other etiologies. 2. Small hiatal hernia. 3. Constipation with diverticulosis. 4. Small increased opacity in the anterior base of the anterior basal right lower lobe segment, could be atelectasis or a small pneumonia. 5. Stable mildly prominent inguinal chain nodes. No new or worsening adenopathy. 6. Umbilical fat hernia. Electronically  Signed   By: Telford Nab M.D.   On: 05/23/2022 05:32   DG Abdomen 1 View  Result Date: 05/21/2022 CLINICAL DATA:  Abdominal pain, nausea, vomiting EXAM: ABDOMEN - 1 VIEW COMPARISON:  04/26/2022 FINDINGS: Bowel gas pattern is nonspecific. Small amount of stool is seen in colon. There is no fecal impaction in rectum. No abnormal masses or calcifications are seen. Kidneys are partly obscured by bowel contents. Phleboliths are seen in pelvis. IMPRESSION: No radiographic abnormalities are seen in abdomen. Electronically Signed   By: Elmer Picker M.D.   On: 05/21/2022 09:49   DG Chest Portable  1 View  Result Date: 05/21/2022 CLINICAL DATA:  51 year old female with history of abdominal pain and emesis. Nausea. EXAM: PORTABLE CHEST 1 VIEW COMPARISON:  Chest x-ray 04/21/2022. FINDINGS: Lung volumes are normal. No consolidative airspace disease. No pleural effusions. No pneumothorax. No pulmonary nodule or mass noted. Pulmonary vasculature and the cardiomediastinal silhouette are within normal limits. Left-sided pacemaker/AICD with lead tip projecting over the expected location of the right ventricular apex. IMPRESSION: 1. No radiographic evidence of acute cardiopulmonary disease. Electronically Signed   By: Vinnie Langton M.D.   On: 05/21/2022 08:44      IMPRESSION AND PLAN:  Assessment and Plan: * Acute gastritis - This is associated with diabetic gastroparesis with subsequent intractable nausea and vomiting. - She will be placed on IV PPI therapy. - Scheduled IV Reglan will be provided.  GI bleeding - She has subsequent mild acute blood loss anemia on top of her chronic anemia. - We will place on IV Protonix bolus and infusion. - GI consult will be obtained. - I notified Dr. Allen Norris about the patient.  Hypertensive urgency - We will continue her antihypertensives. - She will be placed on as needed IV labetalol.  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 - She will be placed on supplement coverage with NovoLog with frequent fingerstick blood glucose measures. - We will continue her basal coverage that can be cut down to half her home dose when she is n.p.o..    DVT prophylaxis: SCDs Advanced Care Planning:  Code Status: full code.  Family Communication:  The plan of care was discussed in details with the patient (and family). I answered all questions. The patient agreed to proceed with the above mentioned plan. Further management will depend upon hospital course. Disposition Plan: Back to previous home environment Consults called: Gastroenterology All the records are reviewed and case discussed with ED provider.  Status is: Inpatient  At the time of the admission, it appears that the appropriate admission status for this patient is inpatient.  This is judged to be reasonable and necessary in order to provide the required intensity of service to ensure the patient's safety given the presenting symptoms, physical exam findings and initial radiographic and laboratory data in the context of comorbid conditions.  The patient requires inpatient status due to high intensity of service, high risk of further deterioration and high frequency of surveillance required.  I certify that at the time of admission, it is my clinical judgment that the patient will require inpatient hospital care extending more than 2 midnights.                            Dispo: The patient is from: Home              Anticipated d/c is to: Home              Patient currently is not medically stable to d/c.              Difficult to place patient: No  Christel Mormon M.D on 05/23/2022 at 8:10 AM  [facility]   From 7 PM-7 AM, contact night-coverage [email or web address]  CC: Primary care physician; [name], PA

## 2022-05-23 NOTE — Anesthesia Procedure Notes (Signed)
Procedure Name: Intubation Date/Time: 05/23/2022 3:00 PM  Performed by: Rolla Plate, CRNAPre-anesthesia Checklist: Patient identified, Patient being monitored, Timeout performed, Emergency Drugs available and Suction available Patient Re-evaluated:Patient Re-evaluated prior to induction Oxygen Delivery Method: Circle system utilized Preoxygenation: Pre-oxygenation with 100% oxygen Induction Type: IV induction and Rapid sequence Laryngoscope Size: 3 and McGraph Grade View: Grade I Tube type: Oral Tube size: 7.0 mm Number of attempts: 1 Airway Equipment and Method: Stylet Placement Confirmation: ETT inserted through vocal cords under direct vision, positive ETCO2 and breath sounds checked- equal and bilateral Secured at: 21 cm Tube secured with: Tape Dental Injury: Teeth and Oropharynx as per pre-operative assessment

## 2022-05-23 NOTE — Inpatient Diabetes Management (Addendum)
Inpatient Diabetes Program Recommendations  AACE/ADA: New Consensus Statement on Inpatient Glycemic Control   Target Ranges:  Prepandial:   less than 140 mg/dL      Peak postprandial:   less than 180 mg/dL (1-2 hours)      Critically ill patients:  140 - 180 mg/dL    Latest Reference Range & Units 05/23/22 01:17 05/23/22 05:45 05/23/22 07:12  Glucose-Capillary 70 - 99 mg/dL 141 (H) 120 (H) 93   Review of Glycemic Control  Diabetes history: Type 1 DM (requires basal, carb coverage, and correction insulin) Outpatient Diabetes medications: OmniPod insulin pump with Novolog; Tresiba 50 units daily if not using pump; FreeStyle Libre3 CGM Current orders for Inpatient glycemic control: Novolog 0-15 units TID with meals, Novolog 0-5 units QHS; Tyler Aas daily PRN  Inpatient Diabetes Program Recommendations:    Insulin:  Patient has on her insulin pump at this time and would like to continue to use it. Please discontinue Tresiba PRN and Novolog correction scale. Please use Insulin Pump order set to order CBGs AC&HS and 2am and Insulin pump AC&HS and 2 am.    NOTE: Patient has hx of Type1 DM and uses an insulin pump for DM control outpatient. Patient is currently in the ED and being admitted with acute gastritis, GI bleeding, and hypertensive urgency.Glucose has ranged from 93-141 mg/dl since arrival to the hospital. Patient has had several hospital admissions over the past 2 months (this is 6th admission since 03/25/22).  Sent chat message to Rex Kras, RN in ED to inquire if patient has insulin pump on. RN reports that patient has insulin pump on as patient did not want to remove it. Therefore, will ask attending provider to discontinue all SQ insulin orders and use Insulin Pump order set to order CBGs and insulin pump.  Addendum 05/23/22$RemoveBeforeDEI'@11'OWmDiZjnWRBiFtDs$ :35-Spoke with patient at bedside regarding DM control. Patient confirms that she has on her OmniPod insulin pump and she wants to continue to use the insulin  pump. Patient has PDM device at bedside and was able to view pump settings which are:  Basal 1 unit/hour Total Daily Basal: 24 units/day  Insulin to Carb Ratio 1:9 grams (1 unit covers 9 grams of carbs) Insulin Sensitivity Factor 1:34 mg/dl (1 unit drops glucose 34 mg/dl) Target Glucose 110-140 mg/dl  Patient reports that her current OmniPod will expire tomorrow and she does not have any extra pump supplies with her at the hospital currently. Patient will ask family to bring new pump supplies to the hospital so she will have it so she can change out OmniPod when needed. Patient reports that she has experienced some hypoglycemia over the past few days due to poor PO intake. Patient typically uses FreeStyle Libre3 CGM but notes that it was removed today in radiology. Asked patient to be sure to call nursing staff if she starts to feel any symptoms of hypoglycemia. Patient verbalized understanding and has no questions at this time.  NURSING: Once insulin pump order set is ordered please print off the Patient insulin pump contract and flow sheet. The insulin pump contract should be signed by the patient and then placed in the chart. The patient insulin pump flow sheet will be completed by the patient at the bedside and the RN caring for the patient will use the patient's flow sheet to document in the [facility]. RN will need to complete the Nursing Insulin Pump Flowsheet at least once a shift. Patient will need to keep extra insulin pump supplies at the bedside  at all times.   Thanks, Barnie Alderman, RN, MSN, [place] [facility] Coordinator Inpatient Diabetes Program [phone] (Team Pager from 8am to [place])

## 2022-05-23 NOTE — Consult Note (Signed)
Lucilla Lame, MD [facility]  [address] [place], [place] [address] Phone: [phone] Fax : [phone]  Consultation  Referring Provider:     Dr. Sidney Ace Primary Care Physician:  [name], [place] Primary Gastroenterologist:  Dr. Loletha Carrow         Reason for Consultation:     Nausea and vomiting  Date of Admission:  05/23/2022 Date of Consultation:  05/23/2022         HPI:   [name] is a 51 y.o. female who was recently in the hospital at [facility].  The patient has a history of diabetes with suspected gastroparesis, dysfunctional uterine bleeding, mood disorder, chronic kidney disease, CHF and anemia who presented at that time with nausea and vomiting and was having hematemesis.  She was also reporting abdominal pain.  The patient was seen earlier that week prior to that admission at [facility].  At that time she had coffee-ground emesis and was diagnosed with grade C esophagitis.  The patient had a CT scan earlier this morning that showed:  IMPRESSION: 1. Increasingly prominent thickened folds in the proximal to mid stomach. Most likely due to severe gastritis. Endoscopy may be indicated to exclude other etiologies. 2. Small hiatal hernia. 3. Constipation with diverticulosis. 4. Small increased opacity in the anterior base of the anterior basal right lower lobe segment, could be atelectasis or a small pneumonia. 5. Stable mildly prominent inguinal chain nodes. No new or worsening adenopathy. 6. Umbilical fat hernia.  The patient has been seen by Dr. Fuller Plan in 2022 for the nausea and vomiting with an EGD at that time.  The patient has also followed up with Dr. Loletha Carrow for the same.  She was recently seen by Dr. Lyndel Safe from gastroenterology in August of this year and again in September by Dr. Candis Schatz for the nausea and vomiting.  She then was seen by Dr. Rush Landmark last month for continued nausea and vomiting.  The assessment at discharge 2 weeks ago  from GI in [place] was:   Nausea / vomiting, possibly secondary to underlying gastroparesis in setting of Type I DM.  Will transition IV Reglan to PO. Pharmacy contacted me. Due to renal function Reglan dose needs to be limited to total of 30 mg / day. Will change order to 10 mg TID ac.    Compazine and Pantoprazole already PO Ativan is PO but not taking it Continue Scopolamine patch every 72 hours K+ low normal at 3.5 She already has a follow up scheduled with Dr. Loletha Carrow on 10/25 at 8:40am.    When asked why she came to this hospital when she is usually seen in [place] she said she wanted a second opinion from a gastroenterologist.  The patient also had reported that besides seeing the numerous gastroenterologist stated above she also had an appoint with a nurse practitioner at [place] recently and that was unproductive since the PA told her that she would have to see the doctor.  The patient denies any over-the-counter use or cannabis use.  Past Medical History:  Diagnosis Date   Anemia    Anxiety    Arthritis    Asthma    CHF (congestive heart failure) ([place])    Depression    Diabetic ulcer of left foot ([place]) 05/12/2013   Gastroparesis    Generalized abdominal pain    History of chicken pox    Loss of weight 12/02/2019   Migraines    Mood  disorder ([place])    anxiety   Prurigo nodularis    with diabetic dermopathy   Type 1 diabetes, uncontrolled, with neuropathy    [place]   Ulcers of both lower legs ([place]) 02/20/2014    Past Surgical History:  Procedure Laterality Date   BIOPSY  08/11/2020   Procedure: BIOPSY;  Surgeon: Ladene Artist, MD;  Location: [facility]COPY;  Service: Endoscopy;;   BIOPSY  04/02/2022   Procedure: BIOPSY;  Surgeon: Jackquline Denmark, MD;  Location: [facility]OSCOPY;  Service: Gastroenterology;;   ESOPHAGOGASTRODUODENOSCOPY (EGD) WITH PROPOFOL N/A 08/11/2020   Procedure: ESOPHAGOGASTRODUODENOSCOPY (EGD) WITH PROPOFOL;  Surgeon: Ladene Artist, MD;   Location: [facility];  Service: Endoscopy;  Laterality: N/A;   ESOPHAGOGASTRODUODENOSCOPY (EGD) WITH PROPOFOL N/A 04/02/2022   Procedure: ESOPHAGOGASTRODUODENOSCOPY (EGD) WITH PROPOFOL;  Surgeon: Jackquline Denmark, MD;  Location: [facility];  Service: Gastroenterology;  Laterality: N/A;   ICD IMPLANT N/A 04/09/2022   Procedure: ICD IMPLANT;  Surgeon: Constance Haw, MD;  Location: [facility];  Service: Cardiovascular;  Laterality: N/A;   treadmill stress test  01/2013   WNL, low risk study    Prior to Admission medications   Medication Sig Start Date End Date Taking? Authorizing Provider  acetaminophen (TYLENOL) 500 MG tablet Take 1,000 mg by mouth 3 (three) times daily as needed for moderate pain.    [provider]  albuterol (PROAIR HFA) 108 (90 Base) MCG/ACT inhaler Inhale 2 puffs into the lungs every 6 (six) hours as needed for wheezing or shortness of breath. 07/03/20   [name], PA-C  Carboxymethylcellul-Glycerin (LUBRICATING EYE DROPS OP) Place 1-2 drops into both eyes daily as needed (dry eyes).    [provider]  Continuous Blood Gluc Sensor (FREESTYLE LIBRE 2 SENSOR) MISC Inject 1 Device into the skin every 14 (fourteen) days. 03/20/20   [provider]  Ensure Max Protein (ENSURE MAX PROTEIN) LIQD Take 330 mLs (11 oz total) by mouth daily. 04/30/22   Hongalgi, [name], MD  Insulin Disposable Pump (OMNIPOD 10 PACK) MISC by Does not apply route.    [provider]  LORazepam (ATIVAN) 0.5 MG tablet Take 0.5 mg by mouth daily as needed. 05/11/22   [provider]  metoCLOPramide (REGLAN) 10 MG tablet Take 1 tablet (10 mg total) by mouth 3 (three) times daily before meals. 04/29/22   Hongalgi, [name], MD  metoprolol succinate (TOPROL XL) 25 MG 24 hr tablet Take 1 tablet (25 mg total) by mouth daily. 04/19/22   [name], PA-C  Multiple Vitamin (MULTIVITAMIN WITH MINERALS) TABS tablet Take 1 tablet by mouth daily.  04/30/22   Hongalgi, [name], MD  NOVOLOG 100 UNIT/ML injection Inject 0-120 Units into the skin daily. Per sliding scale per patient. Patient states it is an insulin pump 04/06/20   [provider]  pantoprazole (PROTONIX) 40 MG tablet Take 1 tablet (40 mg total) by mouth 2 (two) times daily before a meal. 04/29/22 06/28/22  Hongalgi, [name], MD  polyethylene glycol (MIRALAX / GLYCOLAX) 17 g packet Take 17 g by mouth daily as needed for moderate constipation.    [provider]  pregabalin (LYRICA) 50 MG capsule Take 50 mg by mouth 2 (two) times daily as needed (neuropathy). 12/05/21   [provider]  prochlorperazine (COMPAZINE) 10 MG tablet Take 1 tablet (10 mg total) by mouth every 8 (eight) hours as needed for nausea or vomiting. 04/29/22   Hongalgi, [name], MD  promethazine (  PHENERGAN) 25 MG suppository Place 1 suppository (25 mg total) rectally every 6 (six) hours as needed for nausea or vomiting. 05/21/22   Henderly, Britni A, PA-C  rosuvastatin (CRESTOR) 20 MG tablet Take 1 tablet (20 mg total) by mouth daily. 04/12/22 05/13/22  Darliss Cheney, MD  tobramycin (TOBREX) 0.3 % ophthalmic solution Place 1-2 drops into both eyes See admin instructions. Instill 1-2 drops into both eyes 3 times daily the day before, day of, and the day after every 6 weeks eye injections per patient 11/17/20   [provider]  torsemide (DEMADEX) 20 MG tablet Take 20 mg by mouth daily as needed (swelling). 12/25/21   [provider]  TRESIBA FLEXTOUCH 200 UNIT/ML FlexTouch Pen Inject 50 Units into the skin daily as needed (when insulin pump stops working). 11/04/21   [provider]    Family History  Problem Relation Age of Onset   Diabetes Mother        type 2   Hypertension Mother    Thyroid disease Mother    Bipolar disorder Mother    Heart disease Mother    Calcium disorder Mother    Cancer Maternal Grandmother        Breast, stomach   Cancer Paternal  Grandmother        stomach, lung (smoker)   Diabetes Paternal Grandmother    Diabetes Paternal Grandfather    Heart failure Sister    Diabetes Sister    Stroke Maternal Aunt    Cancer Maternal Uncle        prostate   CAD Maternal Aunt        stents   Cancer Maternal Aunt 64       ovarian     Social History   Tobacco Use   Smoking status: Never   Smokeless tobacco: Never  Vaping Use   Vaping Use: Never used  Substance Use Topics   Alcohol use: Not Currently   Drug use: No    Allergies as of 05/22/2022 - Review Complete 05/22/2022  Allergen Reaction Noted   Atorvastatin Other (See Comments) 04/23/2021   Dulaglutide Nausea And Vomiting and Other (See Comments) 05/23/2020   Zofran [ondansetron] Nausea And Vomiting and Other (See Comments) 04/24/2022   Amoxicillin Itching and Rash 11/25/2017   Lantus [insulin glargine] Itching and Rash 11/25/2017   Miconazole nitrate Nausea And Vomiting and Rash 05/23/2020    Review of Systems:    All systems reviewed and negative except where noted in HPI.   Physical Exam:  Vital signs in last 24 hours: Temp:  [98.1 F (36.7 C)-98.8 F (37.1 C)] 98.1 F (36.7 C) (10/19 0559) Pulse Rate:  [98-111] 98 (10/19 0559) Resp:  [18-21] 21 (10/19 0559) BP: (139-189)/(81-107) 139/81 (10/19 0603) SpO2:  [92 %-98 %] 97 % (10/19 0559) Weight:  [76 kg] 76 kg (10/18 2253)   General:   Pleasant, cooperative in NAD Head:  Normocephalic and atraumatic. Eyes:   No icterus.   Conjunctiva pink. PERRLA. Ears:  Normal auditory acuity. Neck:  Supple; no masses or thyroidomegaly Lungs: Respirations even and unlabored. Lungs clear to auscultation bilaterally.   No wheezes, crackles, or rhonchi.  Heart:  Regular rate and rhythm;  Without murmur, clicks, rubs or gallops Abdomen:  Soft, nondistended, nontender. Normal bowel sounds. No appreciable masses or hepatomegaly.  No rebound or guarding.  Rectal:  Not performed. Msk:  Symmetrical without gross  deformities.    Extremities:  Without edema, cyanosis or clubbing. Neurologic:  Alert  and oriented x3;  grossly normal neurologically. Skin:  Intact without significant lesions or rashes. Cervical Nodes:  No significant cervical adenopathy. Psych:  Alert and cooperative. Normal affect.  LAB RESULTS: Recent Labs    05/21/22 0850 05/22/22 2257 05/23/22 0715  WBC 7.6 7.1 7.3  HGB 10.4* 9.5* 8.8*  HCT 31.7* 30.5* 27.8*  PLT 321 299 313   BMET Recent Labs    05/21/22 0850 05/21/22 1051 05/22/22 2257  NA 143 146* 142  K 4.3 3.9 3.6  CL 102 107 109  CO2 $Re'25 27 27  'MNW$ GLUCOSE 303* 286* 157*  BUN 28* 28* 26*  CREATININE 2.88* 2.69* 3.08*  CALCIUM 9.6 8.8* 8.4*   LFT Recent Labs    05/21/22 0850 05/22/22 2257  PROT 7.3 6.9  ALBUMIN 3.6 3.0*  AST 12* 13*  ALT 5 8  ALKPHOS 85 74  BILITOT 0.9 0.9  BILIDIR 0.1  --   IBILI 0.8  --    PT/INR No results for input(s): "LABPROT", "INR" in the last 72 hours.  STUDIES: CT ABDOMEN PELVIS WO CONTRAST  Result Date: 05/23/2022 CLINICAL DATA:  Abdominal pain, dizziness, nausea and vomiting onset 4 weeks ago. Relates personal history gastroparesis. EXAM: CT ABDOMEN AND PELVIS WITHOUT CONTRAST TECHNIQUE: Multidetector CT imaging of the abdomen and pelvis was performed following the standard protocol without IV contrast. RADIATION DOSE REDUCTION: This exam was performed according to the departmental dose-optimization program which includes automated exposure control, adjustment of the mA and/or kV according to patient size and/or use of iterative reconstruction technique. COMPARISON:  CTA chest, abdomen and pelvis 04/21/2022, CTA abdomen and pelvis 08/24/2020 FINDINGS: Lower chest: There is a small increased opacity anteriorly in the base of the anterior segment of the right lower lobe which could be atelectasis or a small pneumonia. Lung bases are otherwise clear. The cardiac size is normal. Small hiatal hernia. There is spray artifact from  pacemaker wiring in the heart. Small pleural effusions noted on the last CT have resolved since. Hepatobiliary: The liver is unremarkable without contrast. Gallbladder and bile ducts are unremarkable. Pancreas: Unremarkable without contrast. Spleen: Unremarkable without contrast. 1.7 cm splenule again noted anterior to the spleen. Adrenals/Urinary Tract: There is no adrenal mass. No focal abnormality in the unenhanced renal cortex. There is no urinary stone or obstruction, no focal bladder thickening or intravesical stone. Stomach/Bowel: There are increasingly thickened folds in the proximal to mid stomach. This is probably due to severe gastritis but endoscopic follow-up may be warranted to exclude infiltrating disease. There is no dilatation or wall thickening in the small bowel, appendix and large bowel, with moderate fecal stasis again shown and uncomplicated diverticulosis. Vascular/Lymphatic: There is trace aortic calcific plaque, minimal plaque in the right internal iliac artery. There is no AAA. There is no new adenopathy with bilateral scattered mildly prominent inguinal chain nodes stable in comparison. No retroperitoneal adenopathy is seen. Reproductive: Uterus and bilateral adnexa are unremarkable. Other: Small umbilical fat hernia. No incarcerated hernia. There is no free air, free hemorrhage or free fluid. Musculoskeletal: Lumbar spondylosis and slight levoscoliosis. No acute or significant osseous findings. No worrisome regional bone lesion. IMPRESSION: 1. Increasingly prominent thickened folds in the proximal to mid stomach. Most likely due to severe gastritis. Endoscopy may be indicated to exclude other etiologies. 2. Small hiatal hernia. 3. Constipation with diverticulosis. 4. Small increased opacity in the anterior base of the anterior basal right lower lobe segment, could be atelectasis or a small pneumonia. 5. Stable mildly prominent inguinal  chain nodes. No new or worsening adenopathy. 6.  Umbilical fat hernia. Electronically Signed   By: Telford Nab M.D.   On: 05/23/2022 05:32   DG Abdomen 1 View  Result Date: 05/21/2022 CLINICAL DATA:  Abdominal pain, nausea, vomiting EXAM: ABDOMEN - 1 VIEW COMPARISON:  04/26/2022 FINDINGS: Bowel gas pattern is nonspecific. Small amount of stool is seen in colon. There is no fecal impaction in rectum. No abnormal masses or calcifications are seen. Kidneys are partly obscured by bowel contents. Phleboliths are seen in pelvis. IMPRESSION: No radiographic abnormalities are seen in abdomen. Electronically Signed   By: Elmer Picker M.D.   On: 05/21/2022 09:49   DG Chest Portable 1 View  Result Date: 05/21/2022 CLINICAL DATA:  51 year old female with history of abdominal pain and emesis. Nausea. EXAM: PORTABLE CHEST 1 VIEW COMPARISON:  Chest x-ray 04/21/2022. FINDINGS: Lung volumes are normal. No consolidative airspace disease. No pleural effusions. No pneumothorax. No pulmonary nodule or mass noted. Pulmonary vasculature and the cardiomediastinal silhouette are within normal limits. Left-sided pacemaker/AICD with lead tip projecting over the expected location of the right ventricular apex. IMPRESSION: 1. No radiographic evidence of acute cardiopulmonary disease. Electronically Signed   By: Vinnie Langton M.D.   On: 05/21/2022 08:44      Impression / Plan:   Assessment: Principal Problem:   Acute gastritis   [name] is a 51 y.o. y/o female with who comes in with longstanding nausea and vomiting.  She states that has been going on since August but there is a gastric emptying study and multiple notes going back to 2020 about her nausea and vomiting.  She states that she has lost weight recently because it has been worse over the last few weeks.  It has been presumed that the patient has gastroparesis on multiple hospital visits despite the gastric emptying study being normal back in 2020.  The patient did have a CT scan showing  increased gastric folds with suggesting severe gastritis.  Plan:  The patient will be brought up for a upper endoscopy.  This will be done due to the abnormal CT scan.  It is unlikely that what ever has been going on for the last 3 years will be completely resolved during this admission and she should follow-up with her primary gastroenterologist in [place] after discharge.   Thank you for involving me in the care of this patient.      LOS: 0 days   Lucilla Lame, MD, [facility] 05/23/2022, 7:38 AM,  Pager [phone] 7am-5pm  Check AMION for 5pm -7am coverage and on weekends   Note: This dictation was prepared with Dragon dictation along with smaller phrase technology. Any transcriptional errors that result from this process are unintentional.

## 2022-05-23 NOTE — Op Note (Signed)
[facility] Gastroenterology Patient Name: [name] Procedure Date: 05/23/2022 2:49 PM MRN: [id number] Account #: [id number] Date of Birth: 07/27/1971 Admit Type: Inpatient Age: 51 Room: [facility] ENDO ROOM 3 Gender: Female Note Status: Finalized Instrument Name: Michaelle Birks [id number] Procedure:             Upper GI endoscopy Indications:           Nausea with vomiting Providers:             Lucilla Lame MD, MD Referring MD:          No Local Md, MD (Referring MD) Medicines:             General Anesthesia Complications:         No immediate complications. Procedure:             Pre-Anesthesia Assessment:                        - Prior to the procedure, a History and Physical was                         performed, and patient medications and allergies were                         reviewed. The patient's tolerance of previous                         anesthesia was also reviewed. The risks and benefits                         of the procedure and the sedation options and risks                         were discussed with the patient. All questions were                         answered, and informed consent was obtained. Prior                         Anticoagulants: The patient has taken no previous                         anticoagulant or antiplatelet agents. ASA Grade                         Assessment: II - A patient with mild systemic disease.                         After reviewing the risks and benefits, the patient                         was deemed in satisfactory condition to undergo the                         procedure.                        After obtaining informed consent, the endoscope was  passed under direct vision. Throughout the procedure,                         the patient's blood pressure, pulse, and oxygen                         saturations were monitored continuously. The Endoscope                         was  introduced through the mouth, and advanced to the                         second part of duodenum. The upper GI endoscopy was                         accomplished without difficulty. The patient tolerated                         the procedure well. Findings:      LA Grade C (one or more mucosal breaks continuous between tops of 2 or       more mucosal folds, less than 75% circumference) esophagitis with no       bleeding was found in the lower third of the esophagus.      The stomach was normal.      The examined duodenum was normal. Impression:            - LA Grade C reflux esophagitis with no bleeding.                        - Normal stomach.                        - Normal examined duodenum.                        - No specimens collected.                        - The finding are simular to her previous upper                         endoscopy. Recommendation:        - Return patient to hospital ward for ongoing care.                        - Resume previous diet.                        - Continue present medications. Procedure Code(s):     --- Professional ---                        [phone], Esophagogastroduodenoscopy, flexible,                         transoral; diagnostic, including collection of                         specimen(s) by brushing or washing, when performed                         (  separate procedure) Diagnosis Code(s):     --- Professional ---                        R11.2, Nausea with vomiting, unspecified                        K21.00, Gastro-esophageal reflux disease with                         esophagitis, without bleeding CPT copyright 2019 American Medical Association. All rights reserved. The codes documented in this report are preliminary and upon coder review may  be revised to meet current compliance requirements. Lucilla Lame MD, MD 05/23/2022 3:06:54 PM This report has been signed electronically. Number of Addenda: 0 Note Initiated On: 05/23/2022 2:49  PM Estimated Blood Loss:  Estimated blood loss: none.      [facility]

## 2022-05-23 NOTE — Assessment & Plan Note (Signed)
-   We will hold off her insulin pump. - She will be placed on supplement coverage with NovoLog with frequent fingerstick blood glucose measures. - We will continue her basal coverage that can be cut down to half her home dose when she is n.p.o..

## 2022-05-23 NOTE — ED Notes (Signed)
Pt's lines and pants soiled on arrival back to ED. Pt cleaned and changed into blue scrubs. New linens and brief provided.

## 2022-05-23 NOTE — ED Provider Notes (Signed)
[facility] Provider Note    Event Date/Time   First MD Initiated Contact with Patient 05/23/22 0406     (approximate)   History   Emesis   HPI  [name] is a 51 y.o. female  with PMH significant for type 1 diabetes with insulin pump, complicated by diabetic neuropathy and gastroparesis, CKD stage IV, chronic diastolic CHF, hypertension, asthma, anxiety, depression, recent admissions in August and September for intractable nausea and vomiting with gastroparesis and hematemesis presented with intractable nausea and vomiting.  EGD during prior hospitalization showed grade A esophagitis without active bleeding.  Patient denies any recent triggers.  She has been compliant with her home medications.  Patient just came from [facility].  She still having intractable vomiting and a lot of abdominal pain.  Her good friend who is with her and was a Armed forces technical officer here for many years reports that they were going to do a CT or ultrasound of her to evaluate her abdominal pain but never did.  Patient had 1 episode of dark emesis earlier today and now reports that she still having some dark emesis currently.  When I was in the room she was only having some dry heaves.  Patient has been unable to take any of her medicines due to the vomiting.     Physical Exam   Triage Vital Signs: ED Triage Vitals  Enc Vitals Group     BP 05/22/22 2252 (!) 189/98     Pulse Rate 05/22/22 2252 (!) 106     Resp 05/22/22 2252 18     Temp 05/22/22 2252 98.3 F (36.8 C)     Temp Source 05/22/22 2252 Oral     SpO2 05/22/22 2252 98 %     Weight 05/22/22 2253 167 lb 8.8 oz (76 kg)     Height 05/22/22 2253 6' (1.829 m)     Head Circumference --      Peak Flow --      Pain Score 05/22/22 2252 6     Pain Loc --      Pain Edu? --      Excl. in GC? --     Most recent vital signs: Vitals:   05/23/22 0106 05/23/22 0107  BP: (!) 187/107   Pulse: (!) 111 (!) 110  Resp: 20   Temp:  98.8 F (37.1 C)   SpO2: 92% 98%    { General: Awake, alert complaining of a lot of pain looks ill CV:  Good peripheral perfusion.  Heart regular rate and rhythm no audible murmurs Resp:  Normal effort.  Lungs are clear Abd:  No distention.  Soft diffusely tender decreased bowel sounds    ED Results / Procedures / Treatments   Labs (all labs ordered are listed, but only abnormal results are displayed) Labs Reviewed  COMPREHENSIVE METABOLIC PANEL - Abnormal; Notable for the following components:      Result Value   Glucose, Bld 157 (*)    BUN 26 (*)    Creatinine, Ser 3.08 (*)    Calcium 8.4 (*)    Albumin 3.0 (*)    AST 13 (*)    GFR, Estimated 18 (*)    All other components within normal limits  CBC - Abnormal; Notable for the following components:   RBC 3.52 (*)    Hemoglobin 9.5 (*)    HCT 30.5 (*)    All other components within normal limits  URINALYSIS, ROUTINE W REFLEX MICROSCOPIC - Abnormal;  Notable for the following components:   Color, Urine YELLOW (*)    APPearance HAZY (*)    Glucose, UA 50 (*)    Protein, ur >=300 (*)    All other components within normal limits  CBG MONITORING, ED - Abnormal; Notable for the following components:   Glucose-Capillary 157 (*)    All other components within normal limits  CBG MONITORING, ED - Abnormal; Notable for the following components:   Glucose-Capillary 141 (*)    All other components within normal limits  CBG MONITORING, ED - Abnormal; Notable for the following components:   Glucose-Capillary 120 (*)    All other components within normal limits  TROPONIN I (HIGH SENSITIVITY) - Abnormal; Notable for the following components:   Troponin I (High Sensitivity) 37 (*)    All other components within normal limits  TROPONIN I (HIGH SENSITIVITY) - Abnormal; Notable for the following components:   Troponin I (High Sensitivity) 38 (*)    All other components within normal limits  LIPASE, BLOOD  PREGNANCY, URINE  CBG  MONITORING, ED     EKG  EKG #1 read and interpreted by me by me shows sinus tach at a rate of 110 normal axis there is flattening of the ST segment inferiorly and flipped T's in V5 and 6 EKG #2 read interpreted by me shows sinus tachycardia rate of 111 normal axis no significant change from #1 both of these EKGs look similar and in fact slightly better than EKG from 25 March 2022.   RADIOLOGY CT read by radiology reviewed by me and interpreted by me shows thickened gastric folds consistent with gastritis most likely and a possible small pneumonia.   PROCEDURES:  Critical Care performed:   Procedures   MEDICATIONS ORDERED IN ED: Medications  promethazine (PHENERGAN) 25 mg in sodium chloride 0.9 % 50 mL IVPB (has no administration in time range)  morphine (PF) 4 MG/ML injection 4 mg (has no administration in time range)  lactated ringers bolus 1,000 mL (has no administration in time range)  dextrose 5 % solution (has no administration in time range)  labetalol (NORMODYNE) injection 5 mg (has no administration in time range)  metoCLOPramide (REGLAN) injection 10 mg (10 mg Intravenous Given 05/23/22 0440)  sodium chloride 0.9 % bolus 1,000 mL (1,000 mLs Intravenous New Bag/Given 05/23/22 0442)  pantoprazole (PROTONIX) injection 40 mg (40 mg Intravenous Given 05/23/22 0548)     IMPRESSION / MDM / ASSESSMENT AND PLAN / ED COURSE  I reviewed the triage vital signs and the nursing notes. Patient reports Phenergan or Reglan work on her.  She had dropped her dial 1 time that worked fairly well as well.  We will start with some Reglan and then maybe add Phenergan if we need to.  Patient's CBC looks okay.  Her GFR was 22 several days ago had gone down to not team back up to 21 with fluids and now is 18.  Her creatinine is slightly worse at 308 it had been 2.69.  Electrolytes are otherwise okay. ----------------------------------------- 5:48 AM on  05/23/2022 ----------------------------------------- Patient and her friend report now that patient had several episodes of dark bloody emesis in the lobby.  None here.  CT consistent with gastritis.  I have ordered some Protonix.  She still not feeling able to eat anything.  We will get her in the hospital.  Emesis in the room is clear and Hemoccult negative but the emesis that she had in the lobby was thrown in the  trash.  I cannot see that. Differential diagnosis includes, but is not limited to, peptic ulcer, gastroparesis, gastritis  Patient's presentation is most consistent with acute presentation with potential threat to life or bodily function.  he patient is on the cardiac monitor to evaluate for evidence of arrhythmia and/or significant heart rate changes.  Has been seen    FINAL CLINICAL IMPRESSION(S) / ED DIAGNOSES   Final diagnoses:  Nausea and vomiting, unspecified vomiting type  Hematemesis with nausea     Rx / DC Orders   ED Discharge Orders     None        Note:  This document was prepared using Dragon voice recognition software and may include unintentional dictation errors.   Nena Polio, MD 05/23/22 [phone]

## 2022-05-23 NOTE — Progress Notes (Signed)
Reported CBG -61 to Anesthesiologist instructed to continue with D5 NS in pre procedure. Will monitor during procedure.

## 2022-05-23 NOTE — Progress Notes (Signed)
Contacted pharmacist to confirm that protonix is compatible with D51/2NS prior to resuming infusion.

## 2022-05-23 NOTE — Assessment & Plan Note (Signed)
-   This is associated with diabetic gastroparesis with subsequent intractable nausea and vomiting. - She will be placed on IV PPI therapy. - Scheduled IV Reglan will be provided.

## 2022-05-23 NOTE — ED Notes (Signed)
Patient transported to CT 

## 2022-05-23 NOTE — Assessment & Plan Note (Signed)
-   She has subsequent mild acute blood loss anemia on top of her chronic anemia. - We will place on IV Protonix bolus and infusion. - GI consult will be obtained. - I notified Dr. Allen Norris about the patient.

## 2022-05-23 NOTE — Anesthesia Preprocedure Evaluation (Addendum)
Anesthesia Evaluation  Patient identified by MRN, date of birth, ID band Patient awake    Reviewed: Allergy & Precautions, NPO status , Patient's Chart, lab work & pertinent test results, reviewed documented beta blocker date and time   Airway Mallampati: II  TM Distance: >3 FB Neck ROM: Full    Dental  (+) Teeth Intact, Dental Advisory Given   Pulmonary asthma ,    Pulmonary exam normal breath sounds clear to auscultation       Cardiovascular hypertension, Pt. on home beta blockers +CHF (chronic diastolic CHF)  Normal cardiovascular exam+ Cardiac Defibrillator  Rhythm:Regular Rate:Normal  Hypertensive urgency  Chronic systolic CHF (congestive heart failure) (HCC)/s/p cardiac arrest/s/p AICD placement 04/09/2022  Cardiac arrest, improved LVEF, 50-55% , low normal by TTE 9/3   Neuro/Psych  Headaches, PSYCHIATRIC DISORDERS Anxiety Depression  Neuromuscular disease    GI/Hepatic Neg liver ROS, hiatal hernia (small), Nausea/vomiting gastroparesis   Endo/Other  diabetes, Poorly Controlled, Type 1, Insulin DependentInsulin pump  Renal/GU Renal InsufficiencyRenal diseaseAKI- Baseline creatinine 2.5-2.8, presented with creatinine of 3.0     Musculoskeletal  (+) Arthritis ,   Abdominal Normal abdominal exam  (+)   Peds  Hematology  (+) Blood dyscrasia, anemia ,   Anesthesia Other Findings Diabetic gastroparesis presented with intractable nausea, vomitings for last 4 to 5 days prior to admission with associated mild epigastric abdominal pain.  Per GI note: She was admitted 10/8 to 10/10 at [facility] for gastroparesis, then seen on 10/17 at [facility] ED for the same symptoms.  She was discharged and encouraged to follow-up with GI/PCP. Returned back on 10/18 with intractable nausea and vomiting and had coffee-ground emesis in the ED  Reproductive/Obstetrics                            Anesthesia Physical  Anesthesia Plan  ASA: III  Anesthesia Plan: General   Post-op Pain Management: Minimal or no pain anticipated   Induction: Intravenous and Rapid sequence  PONV Risk Score and Plan: 3 and Treatment may vary due to age or medical condition and Ondansetron  Airway Management Planned: Oral ETT  Additional Equipment: None  Intra-op Plan:   Post-operative Plan: Extubation in OR  Informed Consent: I have reviewed the patients History and Physical, chart, labs and discussed the procedure including the risks, benefits and alternatives for the proposed anesthesia with the patient or authorized representative who has indicated his/her understanding and acceptance.     Dental advisory given  Plan Discussed with: CRNA  Anesthesia Plan Comments:       Anesthesia Quick Evaluation

## 2022-05-23 NOTE — Assessment & Plan Note (Signed)
-   We will continue her antihypertensives. - She will be placed on as needed IV labetalol.

## 2022-05-23 NOTE — Progress Notes (Signed)
[facility]                                                                              [place], is a 51 y.o. female, DOB - 01-03-1971, ZWC:[id number] Admit date - 05/23/2022    Outpatient Primary MD for the patient is Clelland, Olivia M, PA  LOS - 0  days  Chief Complaint  Patient presents with   Emesis       Brief summary   Patient is a 51 year old female with anxiety, asthma, CHF, depression, diabetes mellitus type 1 on insulin pump, diabetic gastroparesis presented with intractable nausea, vomitings for last 4 to 5 days prior to admission with associated mild epigastric abdominal pain.  Patient reported abdominal pain, associated dizziness, nausea and vomiting that started 4 weeks ago and worsened in the last 4 to 5 days.  She has not had anything to eat for the last for 5 days due to pain and vomiting.  She was admitted 10/8 to 10/10 at [facility] for gastroparesis, then seen on 10/17 at [facility] ED for the same symptoms.  She was discharged and encouraged to follow-up with GI/PCP. Returned back on 10/18 with intractable nausea and vomiting and had coffee-ground emesis in the ED.  Denied any hematochezia or melena.  No fevers.  In ED, BP 189/98, HR 106, Cr 3.08 CT abdomen showed increasingly prominent thickened folds in the proximal to mid stomach most likely due to severe gastritis, small hiatal hernia.   Assessment & Plan    Principal Problem: Intractable nausea and vomiting secondary to acute gastritis Acute upper GI bleed -Likely secondary to uncontrolled diabetic gastroparesis with subsequent nausea and vomiting, severe gastritis.  -CT abdomen showed severe gastritis, small hiatal hernia -Continue IV PPI, scheduled IV Reglan, IV fluids - GI consulted   Active Problems:   Diabetic gastroparesis ([place]), uncontrolled -Continue IV Reglan scheduled, Ativan as needed for anxiety, refractory nausea and vomiting    AKI (acute kidney  injury) ([place]) -Baseline creatinine 2.5-2.8, presented with creatinine of 3.0 -Continue IV fluid hydration, currently n.p.o.  Hypernatremia -Likely due to poor p.o. intake, water deficit -Received Ringer lactate bolus, placed on D5 half-normal saline     Hypertensive urgency -Continue metoprolol, added hydralazine IV with parameters     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 (HCC) -Continue insulin pump, appreciate diabetic coordinator assistance -Hemoglobin A1c 6.3 on 03/25/2022    Code Status: Full CODE STATUS DVT Prophylaxis:  SCDs Start: 05/23/22 0716   Level of Care: Level of care: Progressive Family Communication: Updated patient   Disposition Plan:      Remains inpatient appropriate: Intractable nausea vomiting, needs further GI work-up   Procedures:  None  Consultants:   GI  Antimicrobials: None  Medications  insulin pump   Subcutaneous TID WC, HS, 0200   labetalol  5 mg Intravenous Once   metoCLOPramide (REGLAN) injection  10 mg Intravenous Q6H   metoprolol succinate  25 mg Oral Daily   multivitamin with minerals  1 tablet Oral Daily   [START ON 05/26/2022] pantoprazole  40 mg Intravenous Q12H   rosuvastatin  10 mg Oral  QPM      Subjective:   Chosen Garron was seen and examined today.  No acute hematemesis.  Feeling miserable with nausea.  No acute chest pain or shortness of breath, abdominal pain.  Objective:   Vitals:   05/23/22 0800 05/23/22 0830 05/23/22 0900 05/23/22 1000  BP: (!) 148/86 135/75 (!) 153/85 (!) 163/104  Pulse: 95 95 95 90  Resp: $Remo'16 15 17 16  'TFQPk$ Temp:      TempSrc:      SpO2: 94% 98% 97% 96%  Weight:      Height:       No intake or output data in the 24 hours ending 05/23/22 1049   Wt Readings from Last 3 Encounters:  05/22/22 76 kg  05/21/22 75.8 kg  05/13/22 77.4 kg     Exam General: Alert and oriented x 3, NAD, uncomfortable with nausea Cardiovascular: S1 S2 auscultated,  RRR Respiratory: Clear to  auscultation bilaterally, no wheezing Gastrointestinal: Soft, mild epigastric TTP, nondistended, + bowel sounds Ext: no pedal edema bilaterally Neuro: Strength 5/5 upper and lower extremities bilaterally Psych: normal affect but uncomfortable with nausea    Data Reviewed:  I have personally reviewed following labs    CBC Lab Results  Component Value Date   WBC 7.3 05/23/2022   RBC 3.21 (L) 05/23/2022   HGB 8.8 (L) 05/23/2022   HCT 27.8 (L) 05/23/2022   MCV 86.6 05/23/2022   MCH 27.4 05/23/2022   PLT 313 05/23/2022   MCHC 31.7 05/23/2022   RDW 14.3 05/23/2022   LYMPHSABS 1.2 05/21/2022   MONOABS 0.3 05/21/2022   EOSABS 0.0 05/21/2022   BASOSABS 0.0 29/56/2130     Last metabolic panel Lab Results  Component Value Date   NA 147 (H) 05/23/2022   K 3.7 05/23/2022   CL 113 (H) 05/23/2022   CO2 27 05/23/2022   BUN 26 (H) 05/23/2022   CREATININE 3.07 (H) 05/23/2022   GLUCOSE 108 (H) 05/23/2022   GFRNONAA 18 (L) 05/23/2022   GFRAA 102 01/31/2020   CALCIUM 8.8 (L) 05/23/2022   PHOS 4.2 05/14/2022   PROT 6.9 05/22/2022   ALBUMIN 3.0 (L) 05/22/2022   LABGLOB 2.5 11/24/2019   AGRATIO 1.2 11/24/2019   BILITOT 0.9 05/22/2022   ALKPHOS 74 05/22/2022   AST 13 (L) 05/22/2022   ALT 8 05/22/2022   ANIONGAP 7 05/23/2022    CBG (last 3)  Recent Labs    05/23/22 0117 05/23/22 0545 05/23/22 0712  GLUCAP 141* 120* 93      Coagulation Profile: No results for input(s): "INR", "PROTIME" in the last 168 hours.   Radiology Studies: I have personally reviewed the imaging studies  CT ABDOMEN PELVIS WO CONTRAST  Result Date: 05/23/2022 CLINICAL DATA:  Abdominal pain, dizziness, nausea and vomiting onset 4 weeks ago. Relates personal history gastroparesis. EXAM: CT ABDOMEN AND PELVIS WITHOUT CONTRAST TECHNIQUE: Multidetector CT imaging of the abdomen and pelvis was performed following the standard protocol without IV contrast. RADIATION DOSE REDUCTION: This exam was performed  according to the departmental dose-optimization program which includes automated exposure control, adjustment of the mA and/or kV according to patient size and/or use of iterative reconstruction technique. COMPARISON:  CTA chest, abdomen and pelvis 04/21/2022, CTA abdomen and pelvis 08/24/2020 FINDINGS: Lower chest: There is a small increased opacity anteriorly in the base of the anterior segment of the right lower lobe which could be atelectasis or a small pneumonia. Lung bases are otherwise clear. The cardiac size is normal. Small hiatal  hernia. There is spray artifact from pacemaker wiring in the heart. Small pleural effusions noted on the last CT have resolved since. Hepatobiliary: The liver is unremarkable without contrast. Gallbladder and bile ducts are unremarkable. Pancreas: Unremarkable without contrast. Spleen: Unremarkable without contrast. 1.7 cm splenule again noted anterior to the spleen. Adrenals/Urinary Tract: There is no adrenal mass. No focal abnormality in the unenhanced renal cortex. There is no urinary stone or obstruction, no focal bladder thickening or intravesical stone. Stomach/Bowel: There are increasingly thickened folds in the proximal to mid stomach. This is probably due to severe gastritis but endoscopic follow-up may be warranted to exclude infiltrating disease. There is no dilatation or wall thickening in the small bowel, appendix and large bowel, with moderate fecal stasis again shown and uncomplicated diverticulosis. Vascular/Lymphatic: There is trace aortic calcific plaque, minimal plaque in the right internal iliac artery. There is no AAA. There is no new adenopathy with bilateral scattered mildly prominent inguinal chain nodes stable in comparison. No retroperitoneal adenopathy is seen. Reproductive: Uterus and bilateral adnexa are unremarkable. Other: Small umbilical fat hernia. No incarcerated hernia. There is no free air, free hemorrhage or free fluid. Musculoskeletal: Lumbar  spondylosis and slight levoscoliosis. No acute or significant osseous findings. No worrisome regional bone lesion. IMPRESSION: 1. Increasingly prominent thickened folds in the proximal to mid stomach. Most likely due to severe gastritis. Endoscopy may be indicated to exclude other etiologies. 2. Small hiatal hernia. 3. Constipation with diverticulosis. 4. Small increased opacity in the anterior base of the anterior basal right lower lobe segment, could be atelectasis or a small pneumonia. 5. Stable mildly prominent inguinal chain nodes. No new or worsening adenopathy. 6. Umbilical fat hernia. Electronically Signed   By: Telford Nab M.D.   On: 05/23/2022 05:32       Sondi Desch M.D. [facility] 05/23/2022, 10:49 AM  Available via Epic secure chat 7am-7pm After 7 pm, please refer to night coverage provider listed on amion.

## 2022-05-23 NOTE — Progress Notes (Signed)
The patient's upper endoscopy today did not show anything different from previous endoscopies.  The patient should be treated with a PPI and may try Reglan 10 mg 3 times a day.  The patient has also been treated in the past with a scopolamine patch every 72 hours.  The patient should follow-up with Dr. Loletha Carrow October 25 and appointment she already has.

## 2022-05-23 NOTE — ED Notes (Signed)
Request made for transport to the floor ?

## 2022-05-24 ENCOUNTER — Encounter: Payer: Self-pay | Admitting: Gastroenterology

## 2022-05-24 DIAGNOSIS — K922 Gastrointestinal hemorrhage, unspecified: Secondary | ICD-10-CM | POA: Diagnosis not present

## 2022-05-24 DIAGNOSIS — R933 Abnormal findings on diagnostic imaging of other parts of digestive tract: Secondary | ICD-10-CM | POA: Diagnosis not present

## 2022-05-24 DIAGNOSIS — K2900 Acute gastritis without bleeding: Secondary | ICD-10-CM | POA: Diagnosis not present

## 2022-05-24 DIAGNOSIS — N179 Acute kidney failure, unspecified: Secondary | ICD-10-CM | POA: Diagnosis not present

## 2022-05-24 LAB — BASIC METABOLIC PANEL
Anion gap: 5 (ref 5–15)
BUN: 19 mg/dL (ref 6–20)
CO2: 27 mmol/L (ref 22–32)
Calcium: 7.8 mg/dL — ABNORMAL LOW (ref 8.9–10.3)
Chloride: 107 mmol/L (ref 98–111)
Creatinine, Ser: 2.65 mg/dL — ABNORMAL HIGH (ref 0.44–1.00)
GFR, Estimated: 21 mL/min — ABNORMAL LOW (ref >60)
Glucose, Bld: 86 mg/dL (ref 70–99)
Potassium: 3.1 mmol/L — ABNORMAL LOW (ref 3.5–5.1)
Sodium: 139 mmol/L (ref 135–145)

## 2022-05-24 LAB — CBC
HCT: 25.1 % — ABNORMAL LOW (ref 36.0–46.0)
Hemoglobin: 7.9 g/dL — ABNORMAL LOW (ref 12.0–15.0)
MCH: 27.4 pg (ref 26.0–34.0)
MCHC: 31.5 g/dL (ref 30.0–36.0)
MCV: 87.2 fL (ref 80.0–100.0)
Platelets: 247 10*3/uL (ref 150–400)
RBC: 2.88 MIL/uL — ABNORMAL LOW (ref 3.87–5.11)
RDW: 14.1 % (ref 11.5–15.5)
WBC: 5 10*3/uL (ref 4.0–10.5)
nRBC: 0 % (ref 0.0–0.2)

## 2022-05-24 LAB — GLUCOSE, CAPILLARY
Glucose-Capillary: 88 mg/dL (ref 70–99)
Glucose-Capillary: 67 mg/dL — ABNORMAL LOW (ref 70–99)
Glucose-Capillary: 73 mg/dL (ref 70–99)
Glucose-Capillary: 126 mg/dL — ABNORMAL HIGH (ref 70–99)

## 2022-05-24 MED ORDER — METOCLOPRAMIDE HCL 10 MG PO TABS: 10.0000 mg | ORAL_TABLET | Freq: Three times a day (TID) | ORAL | 1 refills | Status: DC

## 2022-05-24 MED ORDER — POTASSIUM CHLORIDE CRYS ER 20 MEQ PO TBCR
40.0000 meq | EXTENDED_RELEASE_TABLET | Freq: Once | ORAL | Status: AC
  Administered 2022-05-24: 40 meq via ORAL
  Filled 2022-05-24: qty 2

## 2022-05-24 MED ORDER — LORAZEPAM 0.5 MG PO TABS: 0.5000 mg | ORAL_TABLET | Freq: Three times a day (TID) | ORAL | 0 refills | Status: DC | PRN

## 2022-05-24 MED ORDER — ROSUVASTATIN CALCIUM 20 MG PO TABS: 20.0000 mg | ORAL_TABLET | Freq: Every day | ORAL | 3 refills | Status: DC

## 2022-05-24 MED ORDER — SCOPOLAMINE 1 MG/3DAYS TD PT72: 1.0000 | MEDICATED_PATCH | TRANSDERMAL | 4 refills | Status: DC

## 2022-05-24 MED ORDER — PROMETHAZINE HCL 25 MG RE SUPP: 25.0000 mg | Freq: Four times a day (QID) | RECTAL | 2 refills | Status: DC | PRN

## 2022-05-24 MED ORDER — PROCHLORPERAZINE MALEATE 10 MG PO TABS: 10.0000 mg | ORAL_TABLET | Freq: Four times a day (QID) | ORAL | 1 refills | Status: DC | PRN

## 2022-05-24 MED ORDER — PANTOPRAZOLE SODIUM 40 MG PO TBEC: 40.0000 mg | DELAYED_RELEASE_TABLET | Freq: Two times a day (BID) | ORAL | 1 refills | Status: DC

## 2022-05-24 NOTE — Anesthesia Postprocedure Evaluation (Signed)
Anesthesia Post Note  Patient: [name]  Procedure(s) Performed: ESOPHAGOGASTRODUODENOSCOPY (EGD) WITH PROPOFOL  Patient location during evaluation: Endoscopy Anesthesia Type: General Level of consciousness: awake and alert Pain management: pain level controlled Vital Signs Assessment: post-procedure vital signs reviewed and stable Respiratory status: spontaneous breathing, nonlabored ventilation and respiratory function stable Cardiovascular status: blood pressure returned to baseline and stable Postop Assessment: no apparent nausea or vomiting Anesthetic complications: no   No notable events documented.   Last Vitals:  Vitals:   05/24/22 0732 05/24/22 1153  BP: (!) 144/80 138/78  Pulse: 86 77  Resp: 18 16  Temp: 36.8 C 36.6 C  SpO2: 98% 96%    Last Pain:  Vitals:   05/24/22 0900  TempSrc:   PainSc: 0-No pain                 [place] Ouch

## 2022-05-24 NOTE — TOC Transition Note (Signed)
Transition of Care [facility]) - CM/SW Discharge Note   Patient Details  Name: [name] MRN: [id number] Date of Birth: 07/31/1971  [facility] CM/SW Contact:  Colen Darling, [place] Phone Number: 05/24/2022, 10:54 AM   Clinical Narrative:       Final next level of care: Home/Self Care Barriers to Discharge: Barriers Resolved   Patient Goals and CMS Choice Patient states their goals for this hospitalization and ongoing recovery are:: to return hoe CMS Medicare.gov Compare Post Acute Care list provided to:: Patient    Discharge Placement                    Patient and family notified of of transfer: 05/24/22  Discharge Plan and Services                                     Social Determinants of Health (SDOH) Interventions     Readmission Risk Interventions    04/26/2022   10:53 AM  Readmission Risk Prevention Plan  Transportation Screening Complete  PCP or Specialist Appt within 5-7 Days Complete  Home Care Screening Complete  Medication Review (RN CM) Complete

## 2022-05-24 NOTE — Discharge Summary (Signed)
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  Patient reported abdominal pain, associated dizziness, nausea and vomiting that started 4 weeks ago and worsened in the last 4 to 5 days.  She has not had anything to eat for the last for 5 days due to pain and vomiting.  She was admitted 10/8 to 10/10 at [facility] for gastroparesis, then seen on 10/17 at [facility] ED for the same symptoms.  She was discharged and encouraged to follow-up with GI/PCP. Returned back on 10/18 with intractable nausea and vomiting and had coffee-ground emesis in the ED.  Denied any hematochezia or melena.  No fevers.   In ED, BP 189/98, HR 106, Cr 3.08 CT abdomen showed increasingly prominent thickened folds in the proximal to mid stomach most likely due to severe gastritis, small hiatal hernia.     Assessment and Plan:  Intractable nausea and vomiting secondary to acute gastritis Acute upper GI bleed -Likely secondary to uncontrolled diabetic gastroparesis with subsequent nausea and vomiting, severe gastritis.  -CT abdomen showed severe  gastritis, small hiatal hernia -Patient was placed on IV PPI, scheduled IV Reglan and IV fluids.   -GI was consulted. -Underwent EGD, which showed grade C reflux esophagitis with no bleeding, normal stomach and duodenum. -Continue PPI BID -Outpatient follow-up with GI      Diabetic gastroparesis ([place]), uncontrolled -Continue Reglan 3 times daily, added scopolamine patch -Ativan as needed for refractory nausea vomiting and anxiety    AKI (acute kidney injury) ([place]) superimposed on CKD stage IV -Baseline creatinine 2.5-2.8, presented with creatinine of 3.0 -Tolerating soft diet, creatinine 2.6, at baseline   Hypernatremia -Likely due to poor p.o. 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  No ongoing bleeding on EGD Outpatient follow-up with CBC  Hypokalemia Replaced         Pain control - [place] Controlled Substance Reporting System database was reviewed. and patient was instructed, not to drive, operate heavy machinery, perform activities at heights, swimming or participation in water activities or provide baby-sitting services while on Pain, Sleep and Anxiety Medications; until their outpatient Physician has advised to do so again. Also recommended to not to take more than  prescribed Pain, Sleep and Anxiety Medications.  Consultants: Gastroenterology Procedures performed: EGD Disposition: Home Diet recommendation:  Discharge Diet Orders (From admission, onward)     Start     Ordered   05/24/22 0000  Diet  Carb Modified        05/24/22 1038           Full liquid diet DISCHARGE MEDICATION: Allergies as of 05/24/2022       Reactions   Atorvastatin Other (See Comments)   Dizziness    Dulaglutide Nausea And Vomiting, Other (See Comments)   Caused pancreatitis    Zofran [ondansetron] Nausea And Vomiting, Other (See Comments)   Causes nausea vomiting to worsen / doesn't really work for patient.   Amoxicillin Itching, Rash   Lantus [insulin Glargine] Itching, Rash   Miconazole Nitrate Nausea And Vomiting, Rash        Medication List     TAKE these medications    acetaminophen 500 MG tablet Commonly known as: TYLENOL Take 1,000 mg by mouth 3 (three) times daily as needed for moderate pain.   albuterol 108 (90 Base) MCG/ACT inhaler Commonly known as: ProAir HFA Inhale 2 puffs into the lungs every 6 (six) hours as needed for wheezing or shortness of breath.   Ensure Max Protein Liqd Take 330 mLs (11 oz total) by mouth daily.   FreeStyle Libre 2 Sensor Misc Inject 1 Device into the skin every 14 (fourteen) days.   LORazepam 0.5 MG tablet Commonly known as: ATIVAN Take 1 tablet (0.5 mg total) by mouth every 8 (eight) hours as needed for anxiety (Refractory nausea and vomiting). What changed:  when to take this reasons to take this   LUBRICATING EYE DROPS OP Place 1-2 drops into both eyes daily as needed (dry eyes).   metoCLOPramide 10 MG tablet Commonly known as: Reglan Take 1 tablet (10 mg total) by mouth 3 (three) times daily before meals.   metoprolol succinate 25 MG 24 hr tablet Commonly known as: Toprol XL Take 1 tablet (25 mg total) by mouth daily.   multivitamin with minerals Tabs tablet Take 1 tablet by mouth daily.   NovoLOG 100 UNIT/ML injection Generic drug: insulin aspart Inject 0-120 Units into the skin daily. Per sliding scale per patient. Patient states it is an insulin pump   OmniPod 10 Pack Misc by Does not apply route.   pantoprazole 40 MG  tablet Commonly known as: Protonix Take 1 tablet (40 mg total) by mouth 2 (two) times daily before a meal.   polyethylene glycol 17 g packet Commonly known as: MIRALAX / GLYCOLAX Take 17 g by mouth daily as needed for moderate constipation.   pregabalin 50 MG capsule Commonly known as: LYRICA Take 50 mg by mouth 2 (two) times daily as needed (neuropathy).   prochlorperazine 10 MG tablet Commonly known as: COMPAZINE Take 1 tablet (10 mg total) by mouth every 6 (six) hours as needed for nausea or vomiting. What changed: when to take this   promethazine 25 MG suppository Commonly known as: PHENERGAN Place 1 suppository (25 mg total) rectally every 6 (six) hours as needed for nausea or vomiting.   rosuvastatin 20 MG tablet Commonly known as: CRESTOR Take 1 tablet (20 mg total) by mouth daily.   scopolamine 1 MG/3DAYS Commonly known as: TRANSDERM-SCOP Place 1 patch (1.5 mg total) onto the skin every 3 (three) days. Start taking on: May 26, 2022   tobramycin 0.3 % ophthalmic solution Commonly known as: TOBREX Place 1-2 drops  into both eyes See admin instructions. Instill 1-2 drops into both eyes 3 times daily the day before, day of, and the day after every 6 weeks eye injections per patient   torsemide 20 MG tablet Commonly known as: DEMADEX Take 20 mg by mouth daily as needed (swelling).        Follow-up Information     [name], [place]. Go on 06/06/2022.   Specialty: Internal Medicine Why: $RemoveBefor'@2'ZQbDDYYhUbdb$ :00pm Contact information: [address] [place] [address] [phone]         Doran Stabler, MD Follow up on 05/29/2022.   Specialty: Gastroenterology Why: Please call the office and confirm the appointment. Contact information: [address] [place] [address] [phone]                Discharge Exam: Danley Danker Weights   05/22/22 2253 05/23/22 1430  Weight: 76 kg 74.4 kg   S: Feeling better today, no ongoing nausea  or vomiting.  Cleared to be discharged home  Vitals:   05/24/22 0039 05/24/22 0436 05/24/22 0732 05/24/22 1153  BP: 118/71 124/68 (!) 144/80 138/78  Pulse: 80 78 86 77  Resp: $Remo'17 17 18 16  'GVxft$ Temp:   98.2 F (36.8 C) 97.9 F (36.6 C)  TempSrc:   Oral   SpO2: 98% 97% 98% 96%  Weight:      Height:        Physical Exam General: Alert and oriented x 3, NAD Cardiovascular: S1 S2 clear, RRR.  Respiratory: CTAB, no wheezing, rales or rhonchi Gastrointestinal: Soft, nontender, nondistended, NBS Ext: no pedal edema bilaterally Neuro: no new deficits Skin: No rashes Psych: Normal affect and demeanor, alert and oriented x3    Condition at discharge: fair  The results of significant diagnostics from this hospitalization (including imaging, microbiology, ancillary and laboratory) are listed below for reference.   Imaging Studies: CT ABDOMEN PELVIS WO CONTRAST  Result Date: 05/23/2022 CLINICAL DATA:  Abdominal pain, dizziness, nausea and vomiting onset 4 weeks ago. Relates personal history gastroparesis. EXAM: CT ABDOMEN AND PELVIS WITHOUT CONTRAST TECHNIQUE: Multidetector CT imaging of the abdomen and pelvis was performed following the standard protocol without IV contrast. RADIATION DOSE REDUCTION: This exam was performed according to the departmental dose-optimization program which includes automated exposure control, adjustment of the mA and/or kV according to patient size and/or use of iterative reconstruction technique. COMPARISON:  CTA chest, abdomen and pelvis 04/21/2022, CTA abdomen and pelvis 08/24/2020 FINDINGS: Lower chest: There is a small increased opacity anteriorly in the base of the anterior segment of the right lower lobe which could be atelectasis or a small pneumonia. Lung bases are otherwise clear. The cardiac size is normal. Small hiatal hernia. There is spray artifact from pacemaker wiring in the heart. Small pleural effusions noted on the last CT have resolved since.  Hepatobiliary: The liver is unremarkable without contrast. Gallbladder and bile ducts are unremarkable. Pancreas: Unremarkable without contrast. Spleen: Unremarkable without contrast. 1.7 cm splenule again noted anterior to the spleen. Adrenals/Urinary Tract: There is no adrenal mass. No focal abnormality in the unenhanced renal cortex. There is no urinary stone or obstruction, no focal bladder thickening or intravesical stone. Stomach/Bowel: There are increasingly thickened folds in the proximal to mid stomach. This is probably due to severe gastritis but endoscopic follow-up may be warranted to exclude infiltrating disease. There is no dilatation or wall thickening in the small bowel, appendix and large bowel, with moderate fecal stasis again  shown and uncomplicated diverticulosis. Vascular/Lymphatic: There is trace aortic calcific plaque, minimal plaque in the right internal iliac artery. There is no AAA. There is no new adenopathy with bilateral scattered mildly prominent inguinal chain nodes stable in comparison. No retroperitoneal adenopathy is seen. Reproductive: Uterus and bilateral adnexa are unremarkable. Other: Small umbilical fat hernia. No incarcerated hernia. There is no free air, free hemorrhage or free fluid. Musculoskeletal: Lumbar spondylosis and slight levoscoliosis. No acute or significant osseous findings. No worrisome regional bone lesion. IMPRESSION: 1. Increasingly prominent thickened folds in the proximal to mid stomach. Most likely due to severe gastritis. Endoscopy may be indicated to exclude other etiologies. 2. Small hiatal hernia. 3. Constipation with diverticulosis. 4. Small increased opacity in the anterior base of the anterior basal right lower lobe segment, could be atelectasis or a small pneumonia. 5. Stable mildly prominent inguinal chain nodes. No new or worsening adenopathy. 6. Umbilical fat hernia. Electronically Signed   By: Telford Nab M.D.   On: 05/23/2022 05:32   DG  Abdomen 1 View  Result Date: 05/21/2022 CLINICAL DATA:  Abdominal pain, nausea, vomiting EXAM: ABDOMEN - 1 VIEW COMPARISON:  04/26/2022 FINDINGS: Bowel gas pattern is nonspecific. Small amount of stool is seen in colon. There is no fecal impaction in rectum. No abnormal masses or calcifications are seen. Kidneys are partly obscured by bowel contents. Phleboliths are seen in pelvis. IMPRESSION: No radiographic abnormalities are seen in abdomen. Electronically Signed   By: Elmer Picker M.D.   On: 05/21/2022 09:49   DG Chest Portable 1 View  Result Date: 05/21/2022 CLINICAL DATA:  51 year old female with history of abdominal pain and emesis. Nausea. EXAM: PORTABLE CHEST 1 VIEW COMPARISON:  Chest x-ray 04/21/2022. FINDINGS: Lung volumes are normal. No consolidative airspace disease. No pleural effusions. No pneumothorax. No pulmonary nodule or mass noted. Pulmonary vasculature and the cardiomediastinal silhouette are within normal limits. Left-sided pacemaker/AICD with lead tip projecting over the expected location of the right ventricular apex. IMPRESSION: 1. No radiographic evidence of acute cardiopulmonary disease. Electronically Signed   By: Vinnie Langton M.D.   On: 05/21/2022 08:44   DG Abd 1 View  Result Date: 04/26/2022 CLINICAL DATA:  Nausea, vomiting. EXAM: ABDOMEN - 1 VIEW COMPARISON:  None Available. FINDINGS: The bowel gas pattern is normal. No radio-opaque calculi or other significant radiographic abnormality are seen. IMPRESSION: Negative. Electronically Signed   By: Marijo Conception M.D.   On: 04/26/2022 13:25    Microbiology: Results for orders placed or performed during the hospital encounter of 05/21/22  Resp Panel by RT-PCR (Flu A&B, Covid) Anterior Nasal Swab     Status: None   Collection Time: 05/21/22  8:33 AM   Specimen: Anterior Nasal Swab  Result Value Ref Range Status   SARS Coronavirus 2 by RT PCR NEGATIVE NEGATIVE Final    Comment: (NOTE) SARS-CoV-2 target  nucleic acids are NOT DETECTED.  The SARS-CoV-2 RNA is generally detectable in upper respiratory specimens during the acute phase of infection. The lowest concentration of SARS-CoV-2 viral copies this assay can detect is 138 copies/mL. A negative result does not preclude SARS-Cov-2 infection and should not be used as the sole basis for treatment or other patient management decisions. A negative result may occur with  improper specimen collection/handling, submission of specimen other than nasopharyngeal swab, presence of viral mutation(s) within the areas targeted by this assay, and inadequate number of viral copies(<138 copies/mL). A negative result must be combined with clinical observations, patient history, and  epidemiological information. The expected result is Negative.  Fact Sheet for Patients:  EntrepreneurPulse.com.au  Fact Sheet for Healthcare Providers:  IncredibleEmployment.be  This test is no t yet approved or cleared by the [place] FDA and  has been authorized for detection and/or diagnosis of SARS-CoV-2 by FDA under an Emergency Use Authorization (EUA). This EUA will remain  in effect (meaning this test can be used) for the duration of the COVID-19 declaration under Section 564(b)(1) of the Act, 21 U.S.C.section 360bbb-3(b)(1), unless the authorization is terminated  or revoked sooner.       Influenza A by PCR NEGATIVE NEGATIVE Final   Influenza B by PCR NEGATIVE NEGATIVE Final    Comment: (NOTE) The Xpert Xpress SARS-CoV-2/FLU/RSV plus assay is intended as an aid in the diagnosis of influenza from Nasopharyngeal swab specimens and should not be used as a sole basis for treatment. Nasal washings and aspirates are unacceptable for Xpert Xpress SARS-CoV-2/FLU/RSV testing.  Fact Sheet for Patients: EntrepreneurPulse.com.au  Fact Sheet for Healthcare  Providers: IncredibleEmployment.be  This test is not yet approved or cleared by the [place] FDA and has been authorized for detection and/or diagnosis of SARS-CoV-2 by FDA under an Emergency Use Authorization (EUA). This EUA will remain in effect (meaning this test can be used) for the duration of the COVID-19 declaration under Section 564(b)(1) of the Act, 21 U.S.C. section 360bbb-3(b)(1), unless the authorization is terminated or revoked.  Performed at [facility], [address] [place], [place] [address]     Labs: CBC: Recent Labs  Lab 05/21/22 0850 05/22/22 2257 05/23/22 0715 05/24/22 0429  WBC 7.6 7.1 7.3 5.0  NEUTROABS 6.1  --   --   --   HGB 10.4* 9.5* 8.8* 7.9*  HCT 31.7* 30.5* 27.8* 25.1*  MCV 84.3 86.6 86.6 87.2  PLT 321 299 313 295   Basic Metabolic Panel: Recent Labs  Lab 05/21/22 0850 05/21/22 1051 05/22/22 2257 05/23/22 0715 05/24/22 0429  NA 143 146* 142 147* 139  K 4.3 3.9 3.6 3.7 3.1*  CL 102 107 109 113* 107  CO2 $Re'25 27 27 27 27  'Jpj$ GLUCOSE 303* 286* 157* 108* 86  BUN 28* 28* 26* 26* 19  CREATININE 2.88* 2.69* 3.08* 3.07* 2.65*  CALCIUM 9.6 8.8* 8.4* 8.8* 7.8*   Liver Function Tests: Recent Labs  Lab 05/21/22 0850 05/22/22 2257  AST 12* 13*  ALT 5 8  ALKPHOS 85 74  BILITOT 0.9 0.9  PROT 7.3 6.9  ALBUMIN 3.6 3.0*   CBG: Recent Labs  Lab 05/23/22 2033 05/24/22 0220 05/24/22 0741 05/24/22 0819 05/24/22 1144  GLUCAP 102* 88 67* 73 126*    Discharge time spent: greater than 30 minutes.  Signed: Estill Cotta, MD [facility] 05/24/2022

## 2022-05-28 ENCOUNTER — Encounter (HOSPITAL_COMMUNITY): Payer: Self-pay

## 2022-05-28 ENCOUNTER — Encounter (HOSPITAL_COMMUNITY): Payer: BC Managed Care – PPO

## 2022-05-29 ENCOUNTER — Ambulatory Visit: Payer: BC Managed Care – PPO | Admitting: Gastroenterology

## 2022-05-31 DIAGNOSIS — D638 Anemia in other chronic diseases classified elsewhere: Secondary | ICD-10-CM | POA: Diagnosis not present

## 2022-05-31 DIAGNOSIS — N184 Chronic kidney disease, stage 4 (severe): Secondary | ICD-10-CM | POA: Diagnosis not present

## 2022-05-31 DIAGNOSIS — I10 Essential (primary) hypertension: Secondary | ICD-10-CM | POA: Diagnosis not present

## 2022-05-31 DIAGNOSIS — E1143 Type 2 diabetes mellitus with diabetic autonomic (poly)neuropathy: Secondary | ICD-10-CM | POA: Diagnosis not present

## 2022-06-04 DIAGNOSIS — Z794 Long term (current) use of insulin: Secondary | ICD-10-CM | POA: Diagnosis not present

## 2022-06-04 DIAGNOSIS — E1140 Type 2 diabetes mellitus with diabetic neuropathy, unspecified: Secondary | ICD-10-CM | POA: Diagnosis not present

## 2022-06-04 DIAGNOSIS — E041 Nontoxic single thyroid nodule: Secondary | ICD-10-CM | POA: Diagnosis not present

## 2022-06-04 DIAGNOSIS — E049 Nontoxic goiter, unspecified: Secondary | ICD-10-CM | POA: Diagnosis not present

## 2022-06-17 ENCOUNTER — Other Ambulatory Visit: Payer: BC Managed Care – PPO

## 2022-06-17 ENCOUNTER — Other Ambulatory Visit: Payer: Self-pay | Admitting: Hematology and Oncology

## 2022-06-17 ENCOUNTER — Ambulatory Visit: Payer: BC Managed Care – PPO | Admitting: Hematology and Oncology

## 2022-06-17 DIAGNOSIS — D539 Nutritional anemia, unspecified: Secondary | ICD-10-CM

## 2022-06-18 ENCOUNTER — Inpatient Hospital Stay: Payer: BC Managed Care – PPO | Attending: Hematology and Oncology

## 2022-06-18 ENCOUNTER — Inpatient Hospital Stay: Payer: BC Managed Care – PPO | Admitting: Hematology and Oncology

## 2022-06-18 ENCOUNTER — Inpatient Hospital Stay (HOSPITAL_COMMUNITY)
Admission: EM | Admit: 2022-06-18 | Discharge: 2022-06-21 | DRG: 074 | Disposition: A | Discharge status: Home / Self Care | Payer: BC Managed Care – PPO | Attending: Internal Medicine | Admitting: Internal Medicine

## 2022-06-18 ENCOUNTER — Encounter (HOSPITAL_COMMUNITY): Payer: Self-pay | Admitting: Emergency Medicine

## 2022-06-18 ENCOUNTER — Other Ambulatory Visit: Payer: Self-pay

## 2022-06-18 DIAGNOSIS — I10 Essential (primary) hypertension: Secondary | ICD-10-CM | POA: Diagnosis present

## 2022-06-18 DIAGNOSIS — E1043 Type 1 diabetes mellitus with diabetic autonomic (poly)neuropathy: Secondary | ICD-10-CM | POA: Diagnosis not present

## 2022-06-18 DIAGNOSIS — R079 Chest pain, unspecified: Secondary | ICD-10-CM | POA: Diagnosis not present

## 2022-06-18 DIAGNOSIS — Z8349 Family history of other endocrine, nutritional and metabolic diseases: Secondary | ICD-10-CM

## 2022-06-18 DIAGNOSIS — R0789 Other chest pain: Secondary | ICD-10-CM | POA: Diagnosis not present

## 2022-06-18 DIAGNOSIS — K922 Gastrointestinal hemorrhage, unspecified: Secondary | ICD-10-CM | POA: Diagnosis present

## 2022-06-18 DIAGNOSIS — J45909 Unspecified asthma, uncomplicated: Secondary | ICD-10-CM | POA: Diagnosis present

## 2022-06-18 DIAGNOSIS — D631 Anemia in chronic kidney disease: Secondary | ICD-10-CM | POA: Diagnosis not present

## 2022-06-18 DIAGNOSIS — Z8719 Personal history of other diseases of the digestive system: Secondary | ICD-10-CM | POA: Diagnosis not present

## 2022-06-18 DIAGNOSIS — Z888 Allergy status to other drugs, medicaments and biological substances status: Secondary | ICD-10-CM

## 2022-06-18 DIAGNOSIS — I130 Hypertensive heart and chronic kidney disease with heart failure and stage 1 through stage 4 chronic kidney disease, or unspecified chronic kidney disease: Secondary | ICD-10-CM | POA: Diagnosis not present

## 2022-06-18 DIAGNOSIS — R112 Nausea with vomiting, unspecified: Secondary | ICD-10-CM | POA: Diagnosis not present

## 2022-06-18 DIAGNOSIS — K920 Hematemesis: Secondary | ICD-10-CM | POA: Diagnosis present

## 2022-06-18 DIAGNOSIS — F419 Anxiety disorder, unspecified: Secondary | ICD-10-CM | POA: Diagnosis present

## 2022-06-18 DIAGNOSIS — T68XXXA Hypothermia, initial encounter: Secondary | ICD-10-CM | POA: Diagnosis not present

## 2022-06-18 DIAGNOSIS — K2901 Acute gastritis with bleeding: Secondary | ICD-10-CM | POA: Diagnosis not present

## 2022-06-18 DIAGNOSIS — R000 Tachycardia, unspecified: Secondary | ICD-10-CM | POA: Diagnosis not present

## 2022-06-18 DIAGNOSIS — E10649 Type 1 diabetes mellitus with hypoglycemia without coma: Secondary | ICD-10-CM | POA: Diagnosis not present

## 2022-06-18 DIAGNOSIS — E1065 Type 1 diabetes mellitus with hyperglycemia: Secondary | ICD-10-CM | POA: Diagnosis not present

## 2022-06-18 DIAGNOSIS — Z833 Family history of diabetes mellitus: Secondary | ICD-10-CM | POA: Diagnosis not present

## 2022-06-18 DIAGNOSIS — K209 Esophagitis, unspecified without bleeding: Secondary | ICD-10-CM | POA: Diagnosis present

## 2022-06-18 DIAGNOSIS — Z79899 Other long term (current) drug therapy: Secondary | ICD-10-CM | POA: Diagnosis not present

## 2022-06-18 DIAGNOSIS — E1022 Type 1 diabetes mellitus with diabetic chronic kidney disease: Secondary | ICD-10-CM | POA: Diagnosis not present

## 2022-06-18 DIAGNOSIS — I5032 Chronic diastolic (congestive) heart failure: Secondary | ICD-10-CM | POA: Diagnosis not present

## 2022-06-18 DIAGNOSIS — E109 Type 1 diabetes mellitus without complications: Secondary | ICD-10-CM | POA: Diagnosis present

## 2022-06-18 DIAGNOSIS — E119 Type 2 diabetes mellitus without complications: Secondary | ICD-10-CM | POA: Diagnosis not present

## 2022-06-18 DIAGNOSIS — Z881 Allergy status to other antibiotic agents status: Secondary | ICD-10-CM | POA: Diagnosis not present

## 2022-06-18 DIAGNOSIS — K3184 Gastroparesis: Secondary | ICD-10-CM | POA: Diagnosis not present

## 2022-06-18 DIAGNOSIS — R1011 Right upper quadrant pain: Secondary | ICD-10-CM | POA: Diagnosis not present

## 2022-06-18 DIAGNOSIS — E1143 Type 2 diabetes mellitus with diabetic autonomic (poly)neuropathy: Secondary | ICD-10-CM | POA: Diagnosis present

## 2022-06-18 DIAGNOSIS — R1084 Generalized abdominal pain: Secondary | ICD-10-CM | POA: Diagnosis not present

## 2022-06-18 DIAGNOSIS — N1832 Chronic kidney disease, stage 3b: Secondary | ICD-10-CM | POA: Diagnosis not present

## 2022-06-18 DIAGNOSIS — E43 Unspecified severe protein-calorie malnutrition: Secondary | ICD-10-CM | POA: Insufficient documentation

## 2022-06-18 DIAGNOSIS — Z8249 Family history of ischemic heart disease and other diseases of the circulatory system: Secondary | ICD-10-CM | POA: Diagnosis not present

## 2022-06-18 DIAGNOSIS — R1110 Vomiting, unspecified: Secondary | ICD-10-CM | POA: Diagnosis not present

## 2022-06-18 LAB — CBG MONITORING, ED: Glucose-Capillary: 276 mg/dL — ABNORMAL HIGH (ref 70–99)

## 2022-06-18 NOTE — ED Triage Notes (Signed)
Pt BIB EMS from home with c/o coffee ground emesis today. Pt has hx of the same with admission.   22g LAC, $Remove'4mg'WPgocjN$  zofran

## 2022-06-19 ENCOUNTER — Emergency Department (HOSPITAL_COMMUNITY): Payer: BC Managed Care – PPO

## 2022-06-19 DIAGNOSIS — R112 Nausea with vomiting, unspecified: Secondary | ICD-10-CM | POA: Diagnosis not present

## 2022-06-19 DIAGNOSIS — I5032 Chronic diastolic (congestive) heart failure: Secondary | ICD-10-CM

## 2022-06-19 DIAGNOSIS — I130 Hypertensive heart and chronic kidney disease with heart failure and stage 1 through stage 4 chronic kidney disease, or unspecified chronic kidney disease: Secondary | ICD-10-CM | POA: Diagnosis not present

## 2022-06-19 DIAGNOSIS — E1143 Type 2 diabetes mellitus with diabetic autonomic (poly)neuropathy: Secondary | ICD-10-CM | POA: Diagnosis not present

## 2022-06-19 DIAGNOSIS — K2901 Acute gastritis with bleeding: Secondary | ICD-10-CM | POA: Diagnosis not present

## 2022-06-19 DIAGNOSIS — N1832 Chronic kidney disease, stage 3b: Secondary | ICD-10-CM

## 2022-06-19 DIAGNOSIS — E1022 Type 1 diabetes mellitus with diabetic chronic kidney disease: Secondary | ICD-10-CM | POA: Diagnosis not present

## 2022-06-19 DIAGNOSIS — K920 Hematemesis: Secondary | ICD-10-CM | POA: Diagnosis not present

## 2022-06-19 DIAGNOSIS — K3184 Gastroparesis: Secondary | ICD-10-CM

## 2022-06-19 DIAGNOSIS — I10 Essential (primary) hypertension: Secondary | ICD-10-CM

## 2022-06-19 LAB — CBC WITH DIFFERENTIAL/PLATELET
Abs Immature Granulocytes: 0.02 10*3/uL (ref 0.00–0.07)
Basophils Absolute: 0 10*3/uL (ref 0.0–0.1)
Basophils Relative: 0 %
Eosinophils Absolute: 0 10*3/uL (ref 0.0–0.5)
Eosinophils Relative: 0 %
HCT: 30.2 % — ABNORMAL LOW (ref 36.0–46.0)
Hemoglobin: 9.7 g/dL — ABNORMAL LOW (ref 12.0–15.0)
Immature Granulocytes: 0 %
Lymphocytes Relative: 12 %
Lymphs Abs: 0.9 10*3/uL (ref 0.7–4.0)
MCH: 27.9 pg (ref 26.0–34.0)
MCHC: 32.1 g/dL (ref 30.0–36.0)
MCV: 86.8 fL (ref 80.0–100.0)
Monocytes Absolute: 0.2 10*3/uL (ref 0.1–1.0)
Monocytes Relative: 3 %
Neutro Abs: 6.6 10*3/uL (ref 1.7–7.7)
Neutrophils Relative %: 85 %
Platelets: 375 10*3/uL (ref 150–400)
RBC: 3.48 MIL/uL — ABNORMAL LOW (ref 3.87–5.11)
RDW: 15.9 % — ABNORMAL HIGH (ref 11.5–15.5)
WBC: 7.8 10*3/uL (ref 4.0–10.5)
nRBC: 0 % (ref 0.0–0.2)

## 2022-06-19 LAB — COMPREHENSIVE METABOLIC PANEL
ALT: 8 U/L (ref 0–44)
AST: 20 U/L (ref 15–41)
Albumin: 3 g/dL — ABNORMAL LOW (ref 3.5–5.0)
Alkaline Phosphatase: 70 U/L (ref 38–126)
Anion gap: 9 (ref 5–15)
BUN: 26 mg/dL — ABNORMAL HIGH (ref 6–20)
CO2: 23 mmol/L (ref 22–32)
Calcium: 8.9 mg/dL (ref 8.9–10.3)
Chloride: 107 mmol/L (ref 98–111)
Creatinine, Ser: 2.52 mg/dL — ABNORMAL HIGH (ref 0.44–1.00)
GFR, Estimated: 22 mL/min — ABNORMAL LOW (ref >60)
Glucose, Bld: 296 mg/dL — ABNORMAL HIGH (ref 70–99)
Potassium: 5 mmol/L (ref 3.5–5.1)
Sodium: 139 mmol/L (ref 135–145)
Total Bilirubin: 1.6 mg/dL — ABNORMAL HIGH (ref 0.3–1.2)
Total Protein: 6.8 g/dL (ref 6.5–8.1)

## 2022-06-19 LAB — URINALYSIS, ROUTINE W REFLEX MICROSCOPIC
Bacteria, UA: NONE SEEN
Bilirubin Urine: NEGATIVE
Glucose, UA: >=500 mg/dL — AB
Hgb urine dipstick: NEGATIVE
Ketones, ur: 20 mg/dL — AB
Leukocytes,Ua: NEGATIVE
Nitrite: NEGATIVE
Protein, ur: >=300 mg/dL — AB
Specific Gravity, Urine: 1.013 (ref 1.005–1.030)
pH: 9 — ABNORMAL HIGH (ref 5.0–8.0)

## 2022-06-19 LAB — CBC
HCT: 27.7 % — ABNORMAL LOW (ref 36.0–46.0)
Hemoglobin: 8.8 g/dL — ABNORMAL LOW (ref 12.0–15.0)
MCH: 27.8 pg (ref 26.0–34.0)
MCHC: 31.8 g/dL (ref 30.0–36.0)
MCV: 87.7 fL (ref 80.0–100.0)
Platelets: 322 10*3/uL (ref 150–400)
RBC: 3.16 MIL/uL — ABNORMAL LOW (ref 3.87–5.11)
RDW: 15.8 % — ABNORMAL HIGH (ref 11.5–15.5)
WBC: 8.7 10*3/uL (ref 4.0–10.5)
nRBC: 0 % (ref 0.0–0.2)

## 2022-06-19 LAB — I-STAT CHEM 8, ED
BUN: 28 mg/dL — ABNORMAL HIGH (ref 6–20)
Calcium, Ion: 1.14 mmol/L — ABNORMAL LOW (ref 1.15–1.40)
Chloride: 107 mmol/L (ref 98–111)
Creatinine, Ser: 2.6 mg/dL — ABNORMAL HIGH (ref 0.44–1.00)
Glucose, Bld: 305 mg/dL — ABNORMAL HIGH (ref 70–99)
HCT: 31 % — ABNORMAL LOW (ref 36.0–46.0)
Hemoglobin: 10.5 g/dL — ABNORMAL LOW (ref 12.0–15.0)
Potassium: 4.4 mmol/L (ref 3.5–5.1)
Sodium: 141 mmol/L (ref 135–145)
TCO2: 26 mmol/L (ref 22–32)

## 2022-06-19 LAB — CBG MONITORING, ED
Glucose-Capillary: 299 mg/dL — ABNORMAL HIGH (ref 70–99)
Glucose-Capillary: 196 mg/dL — ABNORMAL HIGH (ref 70–99)
Glucose-Capillary: 112 mg/dL — ABNORMAL HIGH (ref 70–99)

## 2022-06-19 LAB — BASIC METABOLIC PANEL
Anion gap: 9 (ref 5–15)
BUN: 28 mg/dL — ABNORMAL HIGH (ref 6–20)
CO2: 26 mmol/L (ref 22–32)
Calcium: 8.7 mg/dL — ABNORMAL LOW (ref 8.9–10.3)
Chloride: 108 mmol/L (ref 98–111)
Creatinine, Ser: 2.53 mg/dL — ABNORMAL HIGH (ref 0.44–1.00)
GFR, Estimated: 22 mL/min — ABNORMAL LOW (ref >60)
Glucose, Bld: 301 mg/dL — ABNORMAL HIGH (ref 70–99)
Potassium: 4.5 mmol/L (ref 3.5–5.1)
Sodium: 143 mmol/L (ref 135–145)

## 2022-06-19 LAB — BLOOD GAS, VENOUS
Acid-Base Excess: 4.5 mmol/L — ABNORMAL HIGH (ref 0.0–2.0)
Bicarbonate: 28.3 mmol/L — ABNORMAL HIGH (ref 20.0–28.0)
O2 Saturation: 43.2 %
Patient temperature: 37
pCO2, Ven: 38 mmHg — ABNORMAL LOW (ref 44–60)
pH, Ven: 7.48 — ABNORMAL HIGH (ref 7.25–7.43)
pO2, Ven: <31 mmHg — CL (ref 32–45)

## 2022-06-19 LAB — LIPASE, BLOOD: Lipase: 24 U/L (ref 11–51)

## 2022-06-19 LAB — GLUCOSE, CAPILLARY
Glucose-Capillary: 180 mg/dL — ABNORMAL HIGH (ref 70–99)
Glucose-Capillary: 135 mg/dL — ABNORMAL HIGH (ref 70–99)

## 2022-06-19 MED ORDER — INSULIN ASPART 100 UNIT/ML IJ SOLN: 0.0000 [IU] | INTRAMUSCULAR | Status: DC

## 2022-06-19 MED ORDER — SCOPOLAMINE 1 MG/3DAYS TD PT72
1.0000 | MEDICATED_PATCH | TRANSDERMAL | Status: DC
  Administered 2022-06-19: 1.5 mg via TRANSDERMAL
  Filled 2022-06-19: qty 1

## 2022-06-19 MED ORDER — LABETALOL HCL 5 MG/ML IV SOLN: 5.0000 mg | INTRAVENOUS | Status: DC | PRN

## 2022-06-19 MED ORDER — ACETAMINOPHEN 325 MG PO TABS
650.0000 mg | ORAL_TABLET | Freq: Four times a day (QID) | ORAL | Status: DC | PRN
  Administered 2022-06-19: 650 mg via ORAL
  Filled 2022-06-19: qty 2

## 2022-06-19 MED ORDER — LACTATED RINGERS IV SOLN: INTRAVENOUS | Status: DC

## 2022-06-19 MED ORDER — METOCLOPRAMIDE HCL 5 MG/ML IJ SOLN
10.0000 mg | Freq: Once | INTRAMUSCULAR | Status: AC
  Administered 2022-06-19: 10 mg via INTRAVENOUS
  Filled 2022-06-19: qty 2

## 2022-06-19 MED ORDER — SODIUM CHLORIDE 0.9 % IV BOLUS
1000.0000 mL | Freq: Once | INTRAVENOUS | Status: AC
  Administered 2022-06-19: 1000 mL via INTRAVENOUS

## 2022-06-19 MED ORDER — SODIUM CHLORIDE 0.9 % IV SOLN
12.5000 mg | Freq: Four times a day (QID) | INTRAVENOUS | Status: DC | PRN
  Administered 2022-06-19 – 2022-06-20 (×3): 12.5 mg via INTRAVENOUS
  Filled 2022-06-19 (×3): qty 12.5

## 2022-06-19 MED ORDER — ACETAMINOPHEN 650 MG RE SUPP: 650.0000 mg | Freq: Four times a day (QID) | RECTAL | Status: DC | PRN

## 2022-06-19 MED ORDER — PANTOPRAZOLE SODIUM 40 MG IV SOLR: 40.0000 mg | Freq: Two times a day (BID) | INTRAVENOUS | Status: DC

## 2022-06-19 MED ORDER — FENTANYL CITRATE PF 50 MCG/ML IJ SOSY
50.0000 ug | PREFILLED_SYRINGE | Freq: Once | INTRAMUSCULAR | Status: AC
  Administered 2022-06-19: 50 ug via INTRAVENOUS
  Filled 2022-06-19: qty 1

## 2022-06-19 MED ORDER — METOCLOPRAMIDE HCL 5 MG/ML IJ SOLN
10.0000 mg | Freq: Four times a day (QID) | INTRAMUSCULAR | Status: DC
  Administered 2022-06-19 – 2022-06-21 (×10): 10 mg via INTRAVENOUS
  Filled 2022-06-19 (×9): qty 2

## 2022-06-19 MED ORDER — INSULIN ASPART 100 UNIT/ML IJ SOLN
0.0000 [IU] | INTRAMUSCULAR | Status: DC
  Administered 2022-06-19: 5 [IU] via SUBCUTANEOUS
  Administered 2022-06-19 (×2): 2 [IU] via SUBCUTANEOUS
  Administered 2022-06-19 – 2022-06-20 (×2): 1 [IU] via SUBCUTANEOUS
  Administered 2022-06-20 (×2): 2 [IU] via SUBCUTANEOUS
  Administered 2022-06-21 (×2): 1 [IU] via SUBCUTANEOUS
  Administered 2022-06-21: 2 [IU] via SUBCUTANEOUS
  Administered 2022-06-21: 1 [IU] via SUBCUTANEOUS
  Filled 2022-06-19: qty 0.09

## 2022-06-19 MED ORDER — PANTOPRAZOLE SODIUM 40 MG IV SOLR
40.0000 mg | Freq: Once | INTRAVENOUS | Status: AC
  Administered 2022-06-19: 40 mg via INTRAVENOUS
  Filled 2022-06-19: qty 10

## 2022-06-19 MED ORDER — INSULIN GLARGINE-YFGN 100 UNIT/ML ~~LOC~~ SOLN
18.0000 [IU] | Freq: Every day | SUBCUTANEOUS | Status: DC
  Administered 2022-06-19 – 2022-06-20 (×2): 18 [IU] via SUBCUTANEOUS
  Filled 2022-06-19 (×2): qty 0.18

## 2022-06-19 MED ORDER — LABETALOL HCL 5 MG/ML IV SOLN
5.0000 mg | INTRAVENOUS | Status: DC | PRN
  Administered 2022-06-19: 5 mg via INTRAVENOUS
  Filled 2022-06-19: qty 4

## 2022-06-19 MED ORDER — PANTOPRAZOLE INFUSION (NEW) - SIMPLE MED
8.0000 mg/h | INTRAVENOUS | Status: DC
  Administered 2022-06-19 (×3): 8 mg/h via INTRAVENOUS
  Filled 2022-06-19: qty 80
  Filled 2022-06-19 (×2): qty 100
  Filled 2022-06-19 (×2): qty 80

## 2022-06-19 NOTE — Inpatient Diabetes Management (Addendum)
Inpatient Diabetes Program Recommendations  AACE/ADA: New Consensus Statement on Inpatient Glycemic Control (2015)  Target Ranges:  Prepandial:   less than 140 mg/dL      Peak postprandial:   less than 180 mg/dL (1-2 hours)      Critically ill patients:  140 - 180 mg/dL   Lab Results  Component Value Date   GLUCAP 196 (H) 06/19/2022   HGBA1C 6.3 (H) 03/25/2022    Review of Glycemic Control  Latest Reference Range & Units 06/19/22 04:21 06/19/22 07:30  Glucose-Capillary 70 - 99 mg/dL 299 (H) 196 (H)  (H): Data is abnormally high  Diabetes history: Type 1 DM (requires basal, carb coverage, and correction insulin) Outpatient Diabetes medications: OmniPod insulin pump with Novolog; Tresiba 50 units daily if not using pump; FreeStyle Libre3 CGM Current orders for Inpatient glycemic control: Novolog 0-9 units Q4H   Inpatient Diabetes Program Recommendations:     Consider adding Semglee 18 units QD (to start now).   NOTE: Patient has hx of Type1 DM and uses an insulin pump for DM control outpatient. Patient is currently in the ED for possible GI bleeding. Per Md note, patient does not have insulin pump on. Therefore, will use SQ insulin.    Outpatient insulin pump rates: Basal 1 unit/hour Total Daily Basal: 24 units/day   Insulin to Carb Ratio 1:9 grams (1 unit covers 9 grams of carbs) Insulin Sensitivity Factor 1:34 mg/dl (1 unit drops glucose 34 mg/dl) Target Glucose 110-140 mg/dl  Spoke with patient briefly at bedside to verify that insulin pump not applied. Patient reports that pump has been off since Monday. Reviewed need for basal and patient is agreement with recommended dose. Does not have supplies at hospital currently. Will plan to use SQ while inpatient.  Thanks, Bronson Curb, MSN, RNC-OB Diabetes Coordinator [phone] (8a-5p)

## 2022-06-19 NOTE — H&P (Signed)
History and Physical    Patient: [name] GEZ:[id number] DOB: Dec 08, 1970 DOA: 06/18/2022 DOS: the patient was seen and examined on 06/19/2022 PCP: [name], PA  Patient coming from: Home  Chief Complaint:  Chief Complaint  Patient presents with   Hematemesis   HPI: [name] is a 51 y.o. female with medical history significant of anxiety, asthma, CHF, depression, type I diabetes mellitus on an on pump and diabetic gastroparesis.  Pt with multiple recent admits for gastroparesis as well as hematemesis from esophagitis.  Most recent EGD 10/19 showed esophagitis.  Pt presents to ED with c/o intractable N/V and now coffee ground emesis again.  Symptoms ongoing for past week.  Typical of her gastroparesis flare.  Turned to coffee ground emesis over past 24h.  No fever, diarrhea.  Last BM on Sunday was NB.    Review of Systems: As mentioned in the history of present illness. All other systems reviewed and are negative. Past Medical History:  Diagnosis Date   Anemia    Anxiety    Arthritis    Asthma    CHF (congestive heart failure) ([place])    Depression    Diabetic ulcer of left foot ([place]) 05/12/2013   Gastroparesis    Generalized abdominal pain    History of chicken pox    Loss of weight 12/02/2019   Migraines    Mood disorder (HCC)    anxiety   Prurigo nodularis    with diabetic dermopathy   Type 1 diabetes, uncontrolled, with neuropathy    [place]   Ulcers of both lower legs ([place]) 02/20/2014   Past Surgical History:  Procedure Laterality Date   BIOPSY  08/11/2020   Procedure: BIOPSY;  Surgeon: Ladene Artist, MD;  Location: [facility];  Service: Endoscopy;;   BIOPSY  04/02/2022   Procedure: BIOPSY;  Surgeon: Jackquline Denmark, MD;  Location: [facility];  Service: Gastroenterology;;   ESOPHAGOGASTRODUODENOSCOPY (EGD) WITH PROPOFOL N/A 08/11/2020   Procedure: ESOPHAGOGASTRODUODENOSCOPY (EGD) WITH PROPOFOL;  Surgeon: Ladene Artist, MD;  Location: [facility];  Service: Endoscopy;  Laterality: N/A;   ESOPHAGOGASTRODUODENOSCOPY (EGD) WITH PROPOFOL N/A 04/02/2022   Procedure: ESOPHAGOGASTRODUODENOSCOPY (EGD) WITH PROPOFOL;  Surgeon: Jackquline Denmark, MD;  Location: [facility];  Service: Gastroenterology;  Laterality: N/A;   ESOPHAGOGASTRODUODENOSCOPY (EGD) WITH PROPOFOL N/A 05/23/2022   Procedure: ESOPHAGOGASTRODUODENOSCOPY (EGD) WITH PROPOFOL;  Surgeon: Lucilla Lame, MD;  Location: [facility];  Service: Endoscopy;  Laterality: N/A;   ICD IMPLANT N/A 04/09/2022   Procedure: ICD IMPLANT;  Surgeon: Constance Haw, MD;  Location: [facility];  Service: Cardiovascular;  Laterality: N/A;   treadmill stress test  01/2013   WNL, low risk study   Social History:  reports that she has never smoked. She has never used smokeless tobacco. She reports that she does not currently use alcohol. She reports that she does not use drugs.  Allergies  Allergen Reactions   Atorvastatin Other (See Comments)    Dizziness    Dulaglutide Nausea And Vomiting and Other (See Comments)    Caused pancreatitis    Zofran [Ondansetron] Nausea And Vomiting and Other (See Comments)    Causes nausea vomiting to worsen / doesn't really work for patient.   Amoxicillin Itching and Rash   Lantus [Insulin Glargine] Itching and Rash   Miconazole Nitrate Nausea And Vomiting and Rash    Family History  Problem Relation Age of Onset   Diabetes Mother        type 2  Hypertension Mother    Thyroid disease Mother    Bipolar disorder Mother    Heart disease Mother    Calcium disorder Mother    Cancer Maternal Grandmother        Breast, stomach   Cancer Paternal Grandmother        stomach, lung (smoker)   Diabetes Paternal Grandmother    Diabetes Paternal Grandfather    Heart failure Sister    Diabetes Sister    Stroke Maternal Aunt    Cancer Maternal Uncle        prostate   CAD Maternal Aunt        stents   Cancer Maternal Aunt 64       ovarian     Prior to Admission medications   Medication Sig Start Date End Date Taking? Authorizing Provider  acetaminophen (TYLENOL) 500 MG tablet Take 1,000 mg by mouth 3 (three) times daily as needed for moderate pain.   Yes [provider]  albuterol (PROAIR HFA) 108 (90 Base) MCG/ACT inhaler Inhale 2 puffs into the lungs every 6 (six) hours as needed for wheezing or shortness of breath. 07/03/20  Yes [name], PA-C  hydrOXYzine (ATARAX) 50 MG tablet Take 50 mg by mouth in the morning, at noon, and at bedtime.   Yes [provider]  Insulin Disposable Pump (OMNIPOD 10 PACK) MISC by Does not apply route. Novolog Up to 120 units Daily   Yes [provider]  LORazepam (ATIVAN) 0.5 MG tablet Take 1 tablet (0.5 mg total) by mouth every 8 (eight) hours as needed for anxiety (Refractory nausea and vomiting). 05/24/22  Yes Rai, Ripudeep K, MD  metoCLOPramide (REGLAN) 10 MG tablet Take 1 tablet (10 mg total) by mouth 3 (three) times daily before meals. 05/24/22  Yes Rai, Ripudeep K, MD  metoprolol succinate (TOPROL XL) 25 MG 24 hr tablet Take 1 tablet (25 mg total) by mouth daily. 04/19/22  Yes [name], PA-C  Multiple Vitamin (MULTIVITAMIN WITH MINERALS) TABS tablet Take 1 tablet by mouth daily. 04/30/22  Yes Hongalgi, [name], MD  NOVOLOG 100 UNIT/ML injection Inject 0-120 Units into the skin daily. Per sliding scale per patient. Patient states it is an insulin pump 04/06/20  Yes [provider]  Carboxymethylcellul-Glycerin (LUBRICATING EYE DROPS OP) Place 1-2 drops into both eyes daily as needed (dry eyes).    [provider]  Continuous Blood Gluc Sensor (FREESTYLE LIBRE 2 SENSOR) MISC Inject 1 Device into the skin every 14 (fourteen) days. 03/20/20   [provider]  Ensure Max Protein (ENSURE MAX PROTEIN) LIQD Take 330 mLs (11 oz total) by mouth daily. 04/30/22   Hongalgi, [name], MD  pantoprazole (PROTONIX) 40 MG tablet Take 1 tablet  (40 mg total) by mouth 2 (two) times daily before a meal. 05/24/22 07/23/22  Rai, Ripudeep K, MD  polyethylene glycol (MIRALAX / GLYCOLAX) 17 g packet Take 17 g by mouth daily as needed for moderate constipation.    [provider]  pregabalin (LYRICA) 50 MG capsule Take 50 mg by mouth 2 (two) times daily as needed (neuropathy). 12/05/21   [provider]  prochlorperazine (COMPAZINE) 10 MG tablet Take 1 tablet (10 mg total) by mouth every 6 (six) hours as needed for nausea or vomiting. 05/24/22   Rai, [name], MD  promethazine (PHENERGAN) 25 MG suppository Place 1 suppository (25 mg total) rectally every 6 (six) hours as needed for nausea or vomiting. 05/24/22   Rai,  Ripudeep K, MD  rosuvastatin (CRESTOR) 20 MG tablet Take 1 tablet (20 mg total) by mouth daily. 05/24/22 09/21/22  Rai, [name], MD  scopolamine (TRANSDERM-SCOP) 1 MG/3DAYS Place 1 patch (1.5 mg total) onto the skin every 3 (three) days. 05/26/22   Rai, Ripudeep K, MD  tobramycin (TOBREX) 0.3 % ophthalmic solution Place 1-2 drops into both eyes See admin instructions. Instill 1-2 drops into both eyes 3 times daily the day before, day of, and the day after every 6 weeks eye injections per patient 11/17/20   [provider]  torsemide (DEMADEX) 20 MG tablet Take 20 mg by mouth daily as needed (swelling). 12/25/21   [provider]    Physical Exam: Vitals:   06/18/22 2350 06/18/22 2352 06/19/22 0100 06/19/22 0230  BP: (!) 181/110 (!) 181/110 (!) 195/121 (!) 187/116  Pulse: (!) 107 (!) 109 (!) 111 (!) 108  Resp: $Remo'20 20 18 16  'AhWVr$ Temp: 98.3 F (36.8 C)     TempSrc: Oral     SpO2: 100% 100% 98% 99%  Weight: 74.8 kg 74.4 kg    Height: 6' (1.829 m) 6' (1.829 m)     Constitutional: Ill appearing Eyes: PERRL, lids and conjunctivae normal ENMT: Mucous membranes are moist. Posterior pharynx clear of any exudate or lesions.Normal dentition.  Neck: normal, supple, no masses, no thyromegaly Respiratory:  clear to auscultation bilaterally, no wheezing, no crackles. Normal respiratory effort. No accessory muscle use.  Cardiovascular: Tachycardic Abdomen: Moderate generalized TTP Musculoskeletal: no clubbing / cyanosis. No joint deformity upper and lower extremities. Good ROM, no contractures. Normal muscle tone.  Skin: no rashes, lesions, ulcers. No induration Neurologic: CN 2-12 grossly intact. Sensation intact, DTR normal. Strength 5/5 in all 4.  Psychiatric: Normal judgment and insight. Alert and oriented x 3. Normal mood.   Data Reviewed:       Latest Ref Rng & Units 06/19/2022    1:31 AM 06/19/2022   12:15 AM 05/24/2022    4:29 AM  CBC  WBC 4.0 - 10.5 K/uL  7.8  5.0   Hemoglobin 12.0 - 15.0 g/dL 10.5  9.7  7.9   Hematocrit 36.0 - 46.0 % 31.0  30.2  25.1   Platelets 150 - 400 K/uL  375  247        Latest Ref Rng & Units 06/19/2022    1:31 AM 06/19/2022   12:15 AM 05/24/2022    4:29 AM  CMP  Glucose 70 - 99 mg/dL 305  296  86   BUN 6 - 20 mg/dL $Remove'28  26  19   'CEYIkmZ$ Creatinine 0.44 - 1.00 mg/dL 2.60  2.52  2.65   Sodium 135 - 145 mmol/L 141  139  139   Potassium 3.5 - 5.1 mmol/L 4.4  5.0  3.1   Chloride 98 - 111 mmol/L 107  107  107   CO2 22 - 32 mmol/L  23  27   Calcium 8.9 - 10.3 mg/dL  8.9  7.8   Total Protein 6.5 - 8.1 g/dL  6.8    Total Bilirubin 0.3 - 1.2 mg/dL  1.6    Alkaline Phos 38 - 126 U/L  70    AST 15 - 41 U/L  20    ALT 0 - 44 U/L  8       Assessment and Plan: * Intractable nausea and vomiting Due to diabetic gastroparesis and UGIB with hematemesis.  3rd admit for same in 3-4 months. EGD last month showed esophagitis (  same previously). NPO IVF Protonix bolus and GTT Reglan IV scheduled Try to avoid narcotics as these slow gastric emptying further  Coffee ground emesis H/H Q6H Transfuse if needed Suspect esophagitis again, just had endoscopy less than 1 month ago Message sent to GI for AM consult.  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 Putting in for sensitive SSI Q4H instead.  Essential hypertension PRN labetalol ordered for the moment Presumably unable to keep home meds down.  Stage 3b chronic kidney disease (CKD) (HCC) CKD 3b-4. Creat today appears baseline IVF Strict intake and output Monitor daily BMP  Chronic diastolic CHF (congestive heart failure) ([place]) Hold home diuretics      Advance Care Planning:   Code Status: Full Code  Consults: Message sent to Dr. Loletha Carrow for GI consult  Family Communication: No family in room  Severity of Illness: The appropriate patient status for this patient is OBSERVATION. Observation status is judged to be reasonable and necessary in order to provide the required intensity of service to ensure the patient's safety. The patient's presenting symptoms, physical exam findings, and initial radiographic and laboratory data in the context of their medical condition is felt to place them at decreased risk for further clinical deterioration. Furthermore, it is anticipated that the patient will be medically stable for discharge from the hospital within 2 midnights of admission.   Author: Etta Quill., DO 06/19/2022 3:59 AM  For on call review [email or web address].

## 2022-06-19 NOTE — Assessment & Plan Note (Addendum)
Due to diabetic gastroparesis and UGIB with hematemesis.  3rd admit for same in 3-4 months. EGD last month showed esophagitis (same previously). NPO IVF Protonix bolus and GTT Reglan IV scheduled Try to avoid narcotics as these slow gastric emptying further

## 2022-06-19 NOTE — Progress Notes (Signed)
Consultation Progress Note   Patient: [name] AYT:[id number] DOB: 04-10-1971 DOA: 06/18/2022 DOS: the patient was seen and examined on 06/19/2022 Primary service: Nealy Hickmon, Manfred Shirts, MD  Brief hospital course: No notes on file  Assessment and Plan: * Intractable nausea and vomiting Due to diabetic gastroparesis and UGIB with hematemesis.  3rd admit for same in 3-4 months. EGD last month showed esophagitis (same previously). NPO IVF Protonix bolus and GTT Reglan IV scheduled Try to avoid narcotics as these slow gastric emptying further  Coffee ground emesis H/H Q6H Transfuse if needed Suspect esophagitis again, just had endoscopy less than 1 month ago Appreciate GI input.  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 Putting in for sensitive SSI Q4H instead.  Essential hypertension PRN labetalol ordered for the moment Presumably unable to keep home meds down.  Stage 3b chronic kidney disease (CKD) (HCC) CKD 3b-4. Creat today appears baseline IVF Strict intake and output Monitor daily BMP  Chronic diastolic CHF (congestive heart failure) ([place]) Hold home diuretics        [facility] will continue to follow the patient.  Subjective: Seen at bedside this morning, She reports epigastric pain, 8/10, she has some improvement with her reglan and protonics.  Physical Exam: Constitutional: chronically ill appearing. Eyes: PERRL, lids and conjunctivae normal ENMT: Mucous membranes are moist. Posterior pharynx clear of any exudate or lesions.Normal dentition.  Neck: normal, supple, no masses, no thyromegaly Respiratory: clear to auscultation bilaterally, no wheezing, no crackles. Normal respiratory effort. No accessory muscle use.  Cardiovascular: Tachycardic Abdomen: Epigastric tenderness, non distended, BS heard Musculoskeletal: no clubbing / cyanosis. No joint deformity upper and lower extremities. Good ROM, no  contractures. Normal muscle tone.  Skin: no rashes, lesions, ulcers. No induration Neurologic: CN 2-12 grossly intact. Sensation intact, DTR normal. Strength 5/5 in all 4.  Psychiatric: Normal judgment and insight. Alert and oriented x 3. Normal mood.  Vitals:   06/19/22 1030 06/19/22 1045 06/19/22 1100 06/19/22 1110  BP: (!) 155/117  (!) 162/95   Pulse: (!) 101 97 95   Resp: 16 10    Temp:    99.1 F (37.3 C)  TempSrc:    Oral  SpO2: 92% 96% 93%   Weight:      Height:        Data Reviewed:  There are no new results to review at this time.  Family Communication:   Time spent: 15  minutes.  Author: Cristela Felt, MD 06/19/2022 12:32 PM  For on call review [email or web address].

## 2022-06-19 NOTE — Assessment & Plan Note (Signed)
CKD 3b-4. Creat today appears baseline IVF Strict intake and output Monitor daily BMP

## 2022-06-19 NOTE — ED Notes (Signed)
Secure chat sent to MD regarding parameters for PRN labetalol.

## 2022-06-19 NOTE — Assessment & Plan Note (Signed)
Doesn't have insulin pump with her. Putting in for sensitive SSI Q4H instead.

## 2022-06-19 NOTE — ED Notes (Signed)
GI MD at BS.

## 2022-06-19 NOTE — Consult Note (Addendum)
Referring Provider: [facility] Primary Care Physician:  [name], PA Primary Gastroenterologist:  Dr. Randel Pigg  Reason for Consultation: Intractable nausea and vomiting  HPI: [name] is a 51 y.o. female history significant for dysfunctional uterine bleeding, mood disorder, chronic kidney disease, CHF, anemia, diabetes with suspected gastroparesis with multiple hospitalizations in the last 6 months, presents for evaluation of intractable nausea and vomiting.  This is her 10th hospital encounter in the hospital for intractable nausea and vomiting and hematemesis.  Most recently seen by Dr. Allen Norris gastroenterology at [facility] October 2023.  Patient states Sunday (11/12) she began vomiting again. States since August 2023 she has had continuous vomiting with seldom relief. States sometimes Reglan, pantoprazole, and phenergan suppositories will provide relief but then "out of nowhere" she will begin to vomit again. Reports 50 episodes of bilious vomiting yesterday with a shot glass full amount of hematemesis that was dark red. Reports epigastric pain associated with vomiting. Reports diarrhea ongoing since 11/12. She states when she has diarrhea she knows she will start vomiting soon. Denies previous colonoscopies. Denies alcohol, tobacco, or THC use. Denies NSAID use. Denies family history of colon cancer or other GI malignancies. Reports 30lb weight loss since August due to not being able to keep food down.  Underwent EGD 05/23/2022 with Dr. Allen Norris which showed LA grade C reflux esophagitis with no bleeding, normal stomach, normal duodenum, no specimens collected  EGD 04/02/2022 for hematemesis with Dr. Lyndel Safe: LA grade a reflux esophagitis with no bleeding, biopsied.  A few gastric polyps.  No active bleeding.  Biopsies unrevealing  EGD 08/11/2020 with Dr. Fuller Plan for nausea and vomiting: LA grade C reflux esophagitis with no bleeding, focal erythematous mucosa in gastric fundus.  Few gastric polyps.   Normal duodenum.  Biopsies unrevealing  Past Medical History:  Diagnosis Date   Anemia    Anxiety    Arthritis    Asthma    CHF (congestive heart failure) ([place])    Depression    Diabetic ulcer of left foot ([place]) 05/12/2013   Gastroparesis    Generalized abdominal pain    History of chicken pox    Loss of weight 12/02/2019   Migraines    Mood disorder ([facility])    anxiety   Prurigo nodularis    with diabetic dermopathy   Type 1 diabetes, uncontrolled, with neuropathy    [place]   Ulcers of both lower legs ([place]) 02/20/2014    Past Surgical History:  Procedure Laterality Date   BIOPSY  08/11/2020   Procedure: BIOPSY;  Surgeon: Ladene Artist, MD;  Location: [facility];  Service: Endoscopy;;   BIOPSY  04/02/2022   Procedure: BIOPSY;  Surgeon: Jackquline Denmark, MD;  Location: [facility];  Service: Gastroenterology;;   ESOPHAGOGASTRODUODENOSCOPY (EGD) WITH PROPOFOL N/A 08/11/2020   Procedure: ESOPHAGOGASTRODUODENOSCOPY (EGD) WITH PROPOFOL;  Surgeon: Ladene Artist, MD;  Location: [facility];  Service: Endoscopy;  Laterality: N/A;   ESOPHAGOGASTRODUODENOSCOPY (EGD) WITH PROPOFOL N/A 04/02/2022   Procedure: ESOPHAGOGASTRODUODENOSCOPY (EGD) WITH PROPOFOL;  Surgeon: Jackquline Denmark, MD;  Location: [facility];  Service: Gastroenterology;  Laterality: N/A;   ESOPHAGOGASTRODUODENOSCOPY (EGD) WITH PROPOFOL N/A 05/23/2022   Procedure: ESOPHAGOGASTRODUODENOSCOPY (EGD) WITH PROPOFOL;  Surgeon: Lucilla Lame, MD;  Location: [facility];  Service: Endoscopy;  Laterality: N/A;   ICD IMPLANT N/A 04/09/2022   Procedure: ICD IMPLANT;  Surgeon: Constance Haw, MD;  Location: [facility];  Service: Cardiovascular;  Laterality: N/A;   treadmill stress test  01/2013   WNL, low  risk study    Prior to Admission medications   Medication Sig Start Date End Date Taking? Authorizing Provider  acetaminophen (TYLENOL) 500 MG tablet Take 1,000 mg by mouth 3 (three) times daily as needed for moderate  pain.   Yes [provider]  albuterol (PROAIR HFA) 108 (90 Base) MCG/ACT inhaler Inhale 2 puffs into the lungs every 6 (six) hours as needed for wheezing or shortness of breath. 07/03/20  Yes [name], PA-C  Carboxymethylcellul-Glycerin (LUBRICATING EYE DROPS OP) Place 1-2 drops into both eyes daily as needed (dry eyes).   Yes [provider]  Ensure Max Protein (ENSURE MAX PROTEIN) LIQD Take 330 mLs (11 oz total) by mouth daily. 04/30/22  Yes Hongalgi, [name], MD  hydrOXYzine (ATARAX) 50 MG tablet Take 50 mg by mouth in the morning, at noon, and at bedtime.   Yes [provider]  LORazepam (ATIVAN) 0.5 MG tablet Take 1 tablet (0.5 mg total) by mouth every 8 (eight) hours as needed for anxiety (Refractory nausea and vomiting). 05/24/22  Yes Rai, Ripudeep K, MD  metoCLOPramide (REGLAN) 10 MG tablet Take 1 tablet (10 mg total) by mouth 3 (three) times daily before meals. 05/24/22  Yes Rai, Ripudeep K, MD  metoprolol succinate (TOPROL XL) 25 MG 24 hr tablet Take 1 tablet (25 mg total) by mouth daily. 04/19/22  Yes [name], PA-C  Multiple Vitamin (MULTIVITAMIN WITH MINERALS) TABS tablet Take 1 tablet by mouth daily. 04/30/22  Yes Hongalgi, [name], MD  NOVOLOG 100 UNIT/ML injection Inject 0-120 Units into the skin daily. Per sliding scale per patient. Patient states it is an insulin pump 04/06/20  Yes [provider]  pantoprazole (PROTONIX) 40 MG tablet Take 1 tablet (40 mg total) by mouth 2 (two) times daily before a meal. 05/24/22 07/23/22 Yes Rai, Ripudeep K, MD  polyethylene glycol (MIRALAX / GLYCOLAX) 17 g packet Take 17 g by mouth daily as needed for moderate constipation.   Yes [provider]  pregabalin (LYRICA) 50 MG capsule Take 50 mg by mouth 2 (two) times daily as needed (neuropathy). 12/05/21  Yes [provider]  prochlorperazine (COMPAZINE) 10 MG tablet Take 1 tablet (10 mg total) by mouth every 6 (six) hours as  needed for nausea or vomiting. 05/24/22  Yes Rai, Ripudeep K, MD  promethazine (PHENERGAN) 25 MG suppository Place 1 suppository (25 mg total) rectally every 6 (six) hours as needed for nausea or vomiting. 05/24/22  Yes Rai, Ripudeep K, MD  rosuvastatin (CRESTOR) 20 MG tablet Take 1 tablet (20 mg total) by mouth daily. 05/24/22 09/21/22 Yes Rai, Ripudeep K, MD  scopolamine (TRANSDERM-SCOP) 1 MG/3DAYS Place 1 patch (1.5 mg total) onto the skin every 3 (three) days. 05/26/22  Yes Rai, Ripudeep K, MD  tobramycin (TOBREX) 0.3 % ophthalmic solution Place 1-2 drops into both eyes See admin instructions. Instill 1-2 drops into both eyes 3 times daily the day before, day of, and the day after every 6 weeks eye injections per patient 11/17/20  Yes [provider]  torsemide (DEMADEX) 20 MG tablet Take 20 mg by mouth daily as needed (swelling). 12/25/21  Yes [provider]  Continuous Blood Gluc Sensor (FREESTYLE LIBRE 2 SENSOR) MISC Inject 1 Device into the skin every 14 (fourteen) days. 03/20/20   [provider]    Scheduled Meds:  insulin aspart  0-9 Units Subcutaneous Q4H   metoCLOPramide (REGLAN) injection  10 mg Intravenous Q6H   [START ON 06/22/2022] pantoprazole  40 mg Intravenous Q12H   Continuous Infusions:  lactated ringers 125 mL/hr at 06/19/22 0915   pantoprazole 8 mg/hr (06/19/22 0916)   promethazine (PHENERGAN) injection (IM or IVPB)     PRN Meds:.acetaminophen **OR** acetaminophen, labetalol, promethazine (PHENERGAN) injection (IM or IVPB)  Allergies as of 06/18/2022 - Review Complete 06/18/2022  Allergen Reaction Noted   Atorvastatin Other (See Comments) 04/23/2021   Dulaglutide Nausea And Vomiting and Other (See Comments) 05/23/2020   Zofran [ondansetron] Nausea And Vomiting and Other (See Comments) 04/24/2022   Amoxicillin Itching and Rash 11/25/2017   Lantus [insulin glargine] Itching and Rash 11/25/2017   Miconazole nitrate Nausea And Vomiting and Rash  05/23/2020    Family History  Problem Relation Age of Onset   Diabetes Mother        type 2   Hypertension Mother    Thyroid disease Mother    Bipolar disorder Mother    Heart disease Mother    Calcium disorder Mother    Cancer Maternal Grandmother        Breast, stomach   Cancer Paternal Grandmother        stomach, lung (smoker)   Diabetes Paternal Grandmother    Diabetes Paternal Grandfather    Heart failure Sister    Diabetes Sister    Stroke Maternal Aunt    Cancer Maternal Uncle        prostate   CAD Maternal Aunt        stents   Cancer Maternal Aunt 64       ovarian    Social History   Socioeconomic History   Marital status: Divorced    Spouse name: Not on file   Number of children: 2   Years of education: Not on file   Highest education level: Not on file  Occupational History   Occupation: Freight forwarder  Tobacco Use   Smoking status: Never   Smokeless tobacco: Never  Vaping Use   Vaping Use: Never used  Substance and Sexual Activity   Alcohol use: Not Currently   Drug use: No   Sexual activity: Yes    Partners: Male    Birth control/protection: None  Other Topics Concern   Not on file  Social History Narrative   Caffeine use: daily   Occupation: cleans houses   Regular exercise: yes, active 5x/wk   Diet: good water, fruits/vegetables daily   Social Determinants of Health   Financial Resource Strain: Not on file  Food Insecurity: No Food Insecurity (05/23/2022)   Hunger Vital Sign    Worried About Running Out of Food in the Last Year: Never true    [place] in the Last Year: Never true  Recent Concern: Food Insecurity - Food Insecurity Present (04/23/2022)   Hunger Vital Sign    Worried About Estate manager/land agent of Food in the Last Year: Sometimes true    Ran Out of Food in the Last Year: Sometimes true  Transportation Needs: No Transportation Needs (05/23/2022)   PRAPARE - Hydrologist (Medical): No    Lack of  Transportation (Non-Medical): No  Physical Activity: Not on file  Stress: Not on file  Social Connections: Not on file  Intimate Partner Violence: Not At Risk (05/23/2022)   Humiliation, Afraid, Rape, and Kick questionnaire    Fear of Current or Ex-Partner: No    Emotionally Abused: No    Physically Abused: No    Sexually Abused: No    Review of Systems:  Review of Systems  Constitutional:  Positive for weight loss. Negative for chills and fever.  HENT:  Negative for hearing loss and tinnitus.   Eyes:  Negative for blurred vision and double vision.  Respiratory:  Negative for cough and hemoptysis.   Cardiovascular:  Negative for chest pain and palpitations.  Gastrointestinal:  Positive for abdominal pain, diarrhea, nausea and vomiting. Negative for blood in stool, constipation, heartburn and melena.  Genitourinary:  Negative for dysuria and urgency.  Musculoskeletal:  Negative for myalgias and neck pain.  Skin:  Negative for itching and rash.  Neurological:  Negative for seizures and loss of consciousness.  Psychiatric/Behavioral:  Negative for depression and suicidal ideas.      Physical Exam:Physical Exam Constitutional:      Appearance: Normal appearance.  HENT:     Head: Normocephalic and atraumatic.     Nose: Nose normal. No congestion.     Mouth/Throat:     Mouth: Mucous membranes are moist.     Pharynx: Oropharynx is clear.  Eyes:     Extraocular Movements: Extraocular movements intact.     Conjunctiva/sclera: Conjunctivae normal.  Cardiovascular:     Rate and Rhythm: Normal rate and regular rhythm.  Pulmonary:     Effort: Pulmonary effort is normal. No respiratory distress.  Abdominal:     General: Abdomen is flat. Bowel sounds are normal. There is no distension.     Palpations: Abdomen is soft. There is no mass.     Tenderness: There is abdominal tenderness (epigastric). There is no guarding or rebound.     Hernia: No hernia is present.  Musculoskeletal:         General: No swelling. Normal range of motion.     Cervical back: Normal range of motion and neck supple.  Skin:    General: Skin is warm and dry.  Neurological:     General: No focal deficit present.     Mental Status: She is alert and oriented to person, place, and time.  Psychiatric:        Mood and Affect: Mood normal.        Behavior: Behavior normal.        Thought Content: Thought content normal.        Judgment: Judgment normal.     Vital signs: Vitals:   06/19/22 0733 06/19/22 0917  BP: (!) 161/99 (!) 146/85  Pulse: 100 93  Resp: 19 20  Temp: 98.7 F (37.1 C)   SpO2: 100% 96%        GI:  Lab Results: Recent Labs    06/19/22 0015 06/19/22 0131 06/19/22 0500  WBC 7.8  --  8.7  HGB 9.7* 10.5* 8.8*  HCT 30.2* 31.0* 27.7*  PLT 375  --  322   BMET Recent Labs    06/19/22 0015 06/19/22 0131 06/19/22 0500  NA 139 141 143  K 5.0 4.4 4.5  CL 107 107 108  CO2 23  --  26  GLUCOSE 296* 305* 301*  BUN 26* 28* 28*  CREATININE 2.52* 2.60* 2.53*  CALCIUM 8.9  --  8.7*   LFT Recent Labs    06/19/22 0015  PROT 6.8  ALBUMIN 3.0*  AST 20  ALT 8  ALKPHOS 70  BILITOT 1.6*   PT/INR No results for input(s): "LABPROT", "INR" in the last 72 hours.   Studies/Results: DG Chest Port 1 View  Result Date: 06/19/2022 CLINICAL DATA:  Vomiting EXAM: PORTABLE CHEST 1 VIEW COMPARISON:  05/21/2022 FINDINGS: Lungs  are clear. No pneumothorax or pleural effusion. Cardiac size within normal limits. 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 Pulmonary vascularity is normal. No acute bone abnormality. IMPRESSION: 1. No active disease. Electronically Signed   By: Fidela Salisbury M.D.   On: 06/19/2022 01:34    Impression: Intractable nausea and vomiting - Underwent EGD 05/23/2022 with Dr. Allen Norris which showed LA grade C reflux esophagitis with no bleeding, normal stomach, normal duodenum, no specimens collected -Lipase 23 -BUN 28, creatinine 2.53, GFR  22 - CT abdomen pelvis wo contrast 10/19: Prominent thickened folds in the proximal to mid stomach, most likely severe gastritis.  Small hiatal hernia.  Constipation with diverticulosis.  Plan: Intractable nausea and vomiting with multiple EGDs showing esophagitis. Some relief with Reglan, pantoprazole and phenergan suppositories. Reports no previous trial of erythromycin. Could consider trial. Continue to monitor CBC and CMP May consider repeat EGD, however, likely will not yield useful information as she just recently had one done for same symptoms last month. Likely from gastroparesis moreso than esophagitis. Continue NPO Continue supportive care [facility] will follow    LOS: 0 days   Nahom Carfagno Radford Pax  PA-C 06/19/2022, 9:51 AM  Contact #  [phone]

## 2022-06-19 NOTE — Assessment & Plan Note (Signed)
Hold home diuretics

## 2022-06-19 NOTE — Assessment & Plan Note (Signed)
PRN labetalol ordered for the moment Presumably unable to keep home meds down.

## 2022-06-19 NOTE — Assessment & Plan Note (Signed)
H/H Q6H Transfuse if needed Suspect esophagitis again, just had endoscopy less than 1 month ago Message sent to GI for AM consult.

## 2022-06-19 NOTE — ED Provider Notes (Signed)
[facility] DEPT Provider Note   CSN: [id number] Arrival date & time: 06/18/22  2343     History  Chief Complaint  Patient presents with   Hematemesis    [name] is a 51 y.o. female.  The history is provided by the patient and medical records.  [name] is a 51 y.o. female who presents to the Emergency Department complaining of vomiting.  She presents to the emergency department for evaluation of vomiting and what she describes as a gastroparesis flare that started 1 week ago.  She reports that she has had epigastric pain and occasional nausea and vomiting for 1 week and then over the last 24 hours has had greater than 50 episodes of coffee-ground emesis.  No associated fever, diarrhea.  Her last BM was on Sunday and it was soft and nonbloody.  Hx/o DM, CHF, CKD.      Home Medications Prior to Admission medications   Medication Sig Start Date End Date Taking? Authorizing Provider  acetaminophen (TYLENOL) 500 MG tablet Take 1,000 mg by mouth 3 (three) times daily as needed for moderate pain.    [provider]  albuterol (PROAIR HFA) 108 (90 Base) MCG/ACT inhaler Inhale 2 puffs into the lungs every 6 (six) hours as needed for wheezing or shortness of breath. 07/03/20   [name], PA-C  Carboxymethylcellul-Glycerin (LUBRICATING EYE DROPS OP) Place 1-2 drops into both eyes daily as needed (dry eyes).    [provider]  Continuous Blood Gluc Sensor (FREESTYLE LIBRE 2 SENSOR) MISC Inject 1 Device into the skin every 14 (fourteen) days. 03/20/20   [provider]  Ensure Max Protein (ENSURE MAX PROTEIN) LIQD Take 330 mLs (11 oz total) by mouth daily. 04/30/22   Hongalgi, [name], MD  Insulin Disposable Pump (OMNIPOD 10 PACK) MISC by Does not apply route.    [provider]  LORazepam (ATIVAN) 0.5 MG tablet Take 1 tablet (0.5 mg total) by mouth every 8 (eight) hours as needed for anxiety (Refractory  nausea and vomiting). 05/24/22   Rai, [name], MD  metoCLOPramide (REGLAN) 10 MG tablet Take 1 tablet (10 mg total) by mouth 3 (three) times daily before meals. 05/24/22   Rai, [name], MD  metoprolol succinate (TOPROL XL) 25 MG 24 hr tablet Take 1 tablet (25 mg total) by mouth daily. 04/19/22   [name], PA-C  Multiple Vitamin (MULTIVITAMIN WITH MINERALS) TABS tablet Take 1 tablet by mouth daily. 04/30/22   Hongalgi, [name], MD  NOVOLOG 100 UNIT/ML injection Inject 0-120 Units into the skin daily. Per sliding scale per patient. Patient states it is an insulin pump 04/06/20   [provider]  pantoprazole (PROTONIX) 40 MG tablet Take 1 tablet (40 mg total) by mouth 2 (two) times daily before a meal. 05/24/22 07/23/22  Rai, Ripudeep K, MD  polyethylene glycol (MIRALAX / GLYCOLAX) 17 g packet Take 17 g by mouth daily as needed for moderate constipation.    [provider]  pregabalin (LYRICA) 50 MG capsule Take 50 mg by mouth 2 (two) times daily as needed (neuropathy). 12/05/21   [provider]  prochlorperazine (COMPAZINE) 10 MG tablet Take 1 tablet (10 mg total) by mouth every 6 (six) hours as needed for nausea or vomiting. 05/24/22   Rai, [name], MD  promethazine (PHENERGAN) 25 MG suppository Place 1 suppository (25 mg total) rectally every 6 (six) hours as needed for nausea or vomiting. 05/24/22   Rai, Ripudeep  K, MD  rosuvastatin (CRESTOR) 20 MG tablet Take 1 tablet (20 mg total) by mouth daily. 05/24/22 09/21/22  Rai, [name], MD  scopolamine (TRANSDERM-SCOP) 1 MG/3DAYS Place 1 patch (1.5 mg total) onto the skin every 3 (three) days. 05/26/22   Rai, Ripudeep K, MD  tobramycin (TOBREX) 0.3 % ophthalmic solution Place 1-2 drops into both eyes See admin instructions. Instill 1-2 drops into both eyes 3 times daily the day before, day of, and the day after every 6 weeks eye injections per patient 11/17/20   [provider]  torsemide (DEMADEX) 20  MG tablet Take 20 mg by mouth daily as needed (swelling). 12/25/21   [provider]      Allergies    Atorvastatin, Dulaglutide, Zofran [ondansetron], Amoxicillin, Lantus [insulin glargine], and Miconazole nitrate    Review of Systems   Review of Systems  All other systems reviewed and are negative.   Physical Exam Updated Vital Signs BP (!) 187/116   Pulse (!) 108   Temp 98.3 F (36.8 C) (Oral)   Resp 16   Ht 6' (1.829 m)   Wt 74.4 kg   LMP 11/17/2021 (Approximate)   SpO2 99%   BMI 22.25 kg/m  Physical Exam Vitals and nursing note reviewed.  Constitutional:      General: She is in acute distress.     Appearance: She is well-developed.     Comments: Uncomfortable appearing, actively vomiting.    HENT:     Head: Normocephalic and atraumatic.  Cardiovascular:     Rate and Rhythm: Regular rhythm. Tachycardia present.     Heart sounds: No murmur heard. Pulmonary:     Effort: Pulmonary effort is normal. No respiratory distress.     Breath sounds: Normal breath sounds.  Abdominal:     Palpations: Abdomen is soft.     Tenderness: There is no guarding or rebound.     Comments: Moderate generalized abdominal tenderness  Musculoskeletal:        General: No swelling or tenderness.  Skin:    General: Skin is warm and dry.  Neurological:     Mental Status: She is alert and oriented to person, place, and time.  Psychiatric:        Behavior: Behavior normal.     ED Results / Procedures / Treatments   Labs (all labs ordered are listed, but only abnormal results are displayed) Labs Reviewed  COMPREHENSIVE METABOLIC PANEL - Abnormal; Notable for the following components:      Result Value   Glucose, Bld 296 (*)    BUN 26 (*)    Creatinine, Ser 2.52 (*)    Albumin 3.0 (*)    Total Bilirubin 1.6 (*)    GFR, Estimated 22 (*)    All other components within normal limits  CBC WITH DIFFERENTIAL/PLATELET - Abnormal; Notable for the following components:   RBC 3.48  (*)    Hemoglobin 9.7 (*)    HCT 30.2 (*)    RDW 15.9 (*)    All other components within normal limits  URINALYSIS, ROUTINE W REFLEX MICROSCOPIC - Abnormal; Notable for the following components:   pH 9.0 (*)    Glucose, UA >=500 (*)    Ketones, ur 20 (*)    Protein, ur >=300 (*)    All other components within normal limits  BLOOD GAS, VENOUS - Abnormal; Notable for the following components:   pH, Ven 7.48 (*)    pCO2, Ven 38 (*)    pO2,  Ven <31 (*)    Bicarbonate 28.3 (*)    Acid-Base Excess 4.5 (*)    All other components within normal limits  CBG MONITORING, ED - Abnormal; Notable for the following components:   Glucose-Capillary 276 (*)    All other components within normal limits  I-STAT CHEM 8, ED - Abnormal; Notable for the following components:   BUN 28 (*)    Creatinine, Ser 2.60 (*)    Glucose, Bld 305 (*)    Calcium, Ion 1.14 (*)    Hemoglobin 10.5 (*)    HCT 31.0 (*)    All other components within normal limits  LIPASE, BLOOD  CBC  BASIC METABOLIC PANEL    EKG EKG Interpretation  Date/Time:  Wednesday June 19 2022 01:04:18 EST Ventricular Rate:  106 PR Interval:  138 QRS Duration: 89 QT Interval:  369 QTC Calculation: 490 R Axis:   96 Text Interpretation: Right and left arm electrode reversal, interpretation assumes no reversal Sinus tachycardia Probable left atrial enlargement Borderline right axis deviation Abnormal lateral Q waves Confirmed by Quintella Reichert [phone]) on 06/19/2022 1:28:41 AM  Radiology DG Chest Port 1 View  Result Date: 06/19/2022 CLINICAL DATA:  Vomiting EXAM: PORTABLE CHEST 1 VIEW COMPARISON:  05/21/2022 FINDINGS: Lungs are clear. No pneumothorax or pleural effusion. Cardiac size within normal limits. 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 Pulmonary vascularity is normal. No acute bone abnormality. IMPRESSION: 1. No active disease. Electronically Signed   By: Fidela Salisbury M.D.   On: 06/19/2022 01:34     Procedures Procedures    Medications Ordered in ED Medications  promethazine (PHENERGAN) 12.5 mg in sodium chloride 0.9 % 50 mL IVPB (has no administration in time range)  labetalol (NORMODYNE) injection 5-10 mg (has no administration in time range)  metoCLOPramide (REGLAN) injection 10 mg (has no administration in time range)  pantoprozole (PROTONIX) 80 mg /NS 100 mL infusion (has no administration in time range)  pantoprazole (PROTONIX) injection 40 mg (has no administration in time range)  pantoprazole (PROTONIX) injection 40 mg (has no administration in time range)  acetaminophen (TYLENOL) tablet 650 mg (has no administration in time range)    Or  acetaminophen (TYLENOL) suppository 650 mg (has no administration in time range)  sodium chloride 0.9 % bolus 1,000 mL (1,000 mLs Intravenous Bolus 06/19/22 0227)  pantoprazole (PROTONIX) injection 40 mg (40 mg Intravenous Given 06/19/22 0227)  fentaNYL (SUBLIMAZE) injection 50 mcg (50 mcg Intravenous Given 06/19/22 0226)  metoCLOPramide (REGLAN) injection 10 mg (10 mg Intravenous Given 06/19/22 0303)    ED Course/ Medical Decision Making/ A&P                           Medical Decision Making Amount and/or Complexity of Data Reviewed Labs: ordered. Radiology: ordered.  Risk Prescription drug management. Decision regarding hospitalization.   Patient with history of diabetes, CKD, CHF with prior cardiac arrest here for evaluation of nausea, vomiting and hematemesis.  She is ill-appearing on evaluation with tachycardia, appears dehydrated and uncomfortable.  She does have emesis in her back that appears to be bloody.  She was treated with IV fluids, antiemetics, pain medications.  Labs with stable renal insufficiency, hemoglobin increased when compared to priors.  UA is not consistent with UTI.  No evidence of DKA.  On repeat evaluation following medications she does appear improved but does have persistent nausea and vomiting.  Plan  to admit for ongoing care for gastroparesis  flare with hematemesis.  Medicine consulted for admission.  Patient updated of findings of studies and recommendation for admission and she is in agreement treatment plan.        Final Clinical Impression(s) / ED Diagnoses Final diagnoses:  Hematemesis with nausea  Diabetic gastroparesis [facility])    Rx / DC Orders ED Discharge Orders     None         Quintella Reichert, MD 06/19/22 [phone]

## 2022-06-20 DIAGNOSIS — I5032 Chronic diastolic (congestive) heart failure: Secondary | ICD-10-CM | POA: Diagnosis present

## 2022-06-20 DIAGNOSIS — K3184 Gastroparesis: Secondary | ICD-10-CM | POA: Diagnosis present

## 2022-06-20 DIAGNOSIS — Z833 Family history of diabetes mellitus: Secondary | ICD-10-CM | POA: Diagnosis not present

## 2022-06-20 DIAGNOSIS — I130 Hypertensive heart and chronic kidney disease with heart failure and stage 1 through stage 4 chronic kidney disease, or unspecified chronic kidney disease: Secondary | ICD-10-CM | POA: Diagnosis present

## 2022-06-20 DIAGNOSIS — Z8719 Personal history of other diseases of the digestive system: Secondary | ICD-10-CM | POA: Diagnosis not present

## 2022-06-20 DIAGNOSIS — E1065 Type 1 diabetes mellitus with hyperglycemia: Secondary | ICD-10-CM | POA: Diagnosis present

## 2022-06-20 DIAGNOSIS — R112 Nausea with vomiting, unspecified: Secondary | ICD-10-CM

## 2022-06-20 DIAGNOSIS — E1022 Type 1 diabetes mellitus with diabetic chronic kidney disease: Secondary | ICD-10-CM | POA: Diagnosis present

## 2022-06-20 DIAGNOSIS — Z888 Allergy status to other drugs, medicaments and biological substances status: Secondary | ICD-10-CM | POA: Diagnosis not present

## 2022-06-20 DIAGNOSIS — D631 Anemia in chronic kidney disease: Secondary | ICD-10-CM | POA: Diagnosis present

## 2022-06-20 DIAGNOSIS — K920 Hematemesis: Secondary | ICD-10-CM | POA: Diagnosis present

## 2022-06-20 DIAGNOSIS — K209 Esophagitis, unspecified without bleeding: Secondary | ICD-10-CM | POA: Diagnosis present

## 2022-06-20 DIAGNOSIS — F419 Anxiety disorder, unspecified: Secondary | ICD-10-CM | POA: Diagnosis present

## 2022-06-20 DIAGNOSIS — Z8349 Family history of other endocrine, nutritional and metabolic diseases: Secondary | ICD-10-CM | POA: Diagnosis not present

## 2022-06-20 DIAGNOSIS — J45909 Unspecified asthma, uncomplicated: Secondary | ICD-10-CM | POA: Diagnosis present

## 2022-06-20 DIAGNOSIS — Z881 Allergy status to other antibiotic agents status: Secondary | ICD-10-CM | POA: Diagnosis not present

## 2022-06-20 DIAGNOSIS — E10649 Type 1 diabetes mellitus with hypoglycemia without coma: Secondary | ICD-10-CM | POA: Diagnosis not present

## 2022-06-20 DIAGNOSIS — E1043 Type 1 diabetes mellitus with diabetic autonomic (poly)neuropathy: Secondary | ICD-10-CM | POA: Diagnosis present

## 2022-06-20 DIAGNOSIS — N1832 Chronic kidney disease, stage 3b: Secondary | ICD-10-CM | POA: Diagnosis present

## 2022-06-20 DIAGNOSIS — Z8249 Family history of ischemic heart disease and other diseases of the circulatory system: Secondary | ICD-10-CM | POA: Diagnosis not present

## 2022-06-20 DIAGNOSIS — Z79899 Other long term (current) drug therapy: Secondary | ICD-10-CM | POA: Diagnosis not present

## 2022-06-20 LAB — GLUCOSE, CAPILLARY
Glucose-Capillary: 117 mg/dL — ABNORMAL HIGH (ref 70–99)
Glucose-Capillary: 123 mg/dL — ABNORMAL HIGH (ref 70–99)
Glucose-Capillary: 124 mg/dL — ABNORMAL HIGH (ref 70–99)
Glucose-Capillary: 137 mg/dL — ABNORMAL HIGH (ref 70–99)
Glucose-Capillary: 165 mg/dL — ABNORMAL HIGH (ref 70–99)
Glucose-Capillary: 174 mg/dL — ABNORMAL HIGH (ref 70–99)
Glucose-Capillary: 62 mg/dL — ABNORMAL LOW (ref 70–99)
Glucose-Capillary: 88 mg/dL (ref 70–99)

## 2022-06-20 MED ORDER — PANTOPRAZOLE SODIUM 40 MG IV SOLR
40.0000 mg | Freq: Two times a day (BID) | INTRAVENOUS | Status: DC
  Administered 2022-06-20 – 2022-06-21 (×3): 40 mg via INTRAVENOUS
  Filled 2022-06-20 (×3): qty 10

## 2022-06-20 MED ORDER — ENSURE ENLIVE PO LIQD: 237.0000 mL | Freq: Two times a day (BID) | ORAL | Status: DC

## 2022-06-20 MED ORDER — ADULT MULTIVITAMIN W/MINERALS CH
1.0000 | ORAL_TABLET | Freq: Every day | ORAL | Status: DC
  Administered 2022-06-20 – 2022-06-21 (×2): 1 via ORAL
  Filled 2022-06-20 (×2): qty 1

## 2022-06-20 MED ORDER — PROCHLORPERAZINE EDISYLATE 10 MG/2ML IJ SOLN
10.0000 mg | Freq: Once | INTRAMUSCULAR | Status: AC
  Administered 2022-06-20: 10 mg via INTRAVENOUS
  Filled 2022-06-20: qty 2

## 2022-06-20 NOTE — Progress Notes (Signed)
Mobility Specialist - Progress Note   06/20/22 1453  Mobility  Activity Ambulated independently in hallway  Level of Assistance Independent after set-up  Assistive Device None  Distance Ambulated (ft) 350 ft  Range of Motion/Exercises Active  Activity Response Tolerated well  Mobility Referral Yes  $Mobility charge 1 Mobility   Pt was found on recliner chair and agreeable to ambulate. Had no complaints during ambulation and at EOS returned to recliner chair with necessities in reach.  Ferd Hibbs Mobility Specialist

## 2022-06-20 NOTE — TOC Initial Note (Signed)
Transition of Care [facility]) - Initial/Assessment Note    Patient Details  Name: [name] MRN: [id number] Date of Birth: 03-22-1971  Transition of Care [facility]) CM/SW Contact:    Leeroy Cha, RN Phone Number: 06/20/2022, 8:19 AM  Clinical Narrative:                  Transition of Care [facility]) Screening Note   Patient Details  Name: [name] Date of Birth: [birth year]   Transition of Care [facility]) CM/SW Contact:    Leeroy Cha, RN Phone Number: 06/20/2022, 8:19 AM    Transition of Care Department [facility]) has reviewed patient and no TOC needs have been identified at this time. We will continue to monitor patient advancement through interdisciplinary progression rounds. If new patient transition needs arise, please place a TOC consult.    Expected Discharge Plan: Home/Self Care Barriers to Discharge: Continued Medical Work up   Patient Goals and CMS Choice Patient states their goals for this hospitalization and ongoing recovery are:: to return to my home CMS Medicare.gov Compare Post Acute Care list provided to:: Patient    Expected Discharge Plan and Services Expected Discharge Plan: Home/Self Care   Discharge Planning Services: CM Consult   Living arrangements for the past 2 months: Single Family Home                                      Prior Living Arrangements/Services Living arrangements for the past 2 months: Single Family Home Lives with:: Self Patient language and need for interpreter reviewed:: Yes Do you feel safe going back to the place where you live?: Yes            Criminal Activity/Legal Involvement Pertinent to Current Situation/Hospitalization: No - Comment as needed  Activities of Daily Living Home Assistive Devices/Equipment: Eyeglasses, Contact lenses ADL Screening (condition at time of admission) Patient's cognitive ability adequate to safely complete daily activities?: Yes Is the patient deaf or have difficulty  hearing?: No Does the patient have difficulty seeing, even when wearing glasses/contacts?: No Does the patient have difficulty concentrating, remembering, or making decisions?: No Patient able to express need for assistance with ADLs?: Yes Does the patient have difficulty dressing or bathing?: No Independently performs ADLs?: Yes (appropriate for developmental age) Does the patient have difficulty walking or climbing stairs?: No Weakness of Legs: Both Weakness of Arms/Hands: Both  Permission Sought/Granted                  Emotional Assessment Appearance:: Appears stated age Attitude/Demeanor/Rapport: Engaged Affect (typically observed): Calm Orientation: : Oriented to  Time, Oriented to Place, Oriented to Self, Oriented to Situation Alcohol / Substance Use: Never Used Psych Involvement: No (comment)  Admission diagnosis:  Diabetic gastroparesis ([place]) [E11.43, K31.84] Intractable nausea and vomiting [R11.2] Hematemesis with nausea [K92.0] Patient Active Problem List   Diagnosis Date Noted   Acute gastritis 05/23/2022   GI bleeding 05/23/2022   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 ([place]) 05/23/2022   Hypertensive urgency 05/23/2022   Abnormal CT scan, gastrointestinal tract 05/23/2022   Acute upper GI bleed 05/23/2022   Gastroparesis 05/13/2022   NSTEMI (non-ST elevated myocardial infarction) ([place]) 05/13/2022   Abdominal pain, epigastric    Chronic vomiting    Anemia    History of esophagitis    Intractable nausea and vomiting 04/23/2022   Cardiac arrest ([place]) 04/05/2022   Essential  hypertension 04/05/2022   Acute esophagitis 04/03/2022   GI bleed 04/02/2022   Coffee ground emesis 04/01/2022   Stage 3b chronic kidney disease (CKD) ([place]) 03/25/2022   Inguinal adenopathy 12/14/2021   Diabetic gastroparesis ([place]) 08/31/2020   Gastric polyps    Bone pain 06/16/2020   Deficiency anemia 04/21/2020   Chronic diastolic CHF (congestive heart failure) ([place])  12/14/2019   Leg edema 11/21/2019   Dysfunctional uterine bleeding 10/11/2019   Nausea and vomiting    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[place]) 07/30/2016   Multiple thyroid nodules 62/86/3817   Complicated grieving 71/16/5790   Diabetic foot ulcers ([place]) 05/01/2013   Skin rash 01/05/2013   Contraception management 12/31/2012   Mood disorder ([place]) 12/30/2012   Unspecified vitamin D deficiency 12/03/2012   H/O thyroid nodule 12/03/2012   Type 1 diabetes mellitus with diabetic neuropathy ([place]) 06/18/2007   PCP:  [name], PA Pharmacy:   [facility] [address] [place] [address] Phone: [phone] Fax: [place], [place] [address] [place] [address] Phone: [phone] Fax: [place] [address] Phone: [phone] Fax: [place] [phone] - [address] [place] [address] Phone: [phone] Fax: [phone]     Social Determinants of Health ([place]) Interventions    Readmission Risk Interventions   Row Labels 04/26/2022   10:53 AM  Readmission Risk Prevention Plan   Section Header. No data exists in this row.   Transportation Screening   Complete  PCP or Specialist Appt within 5-7 Days   Complete  Home Care Screening   Complete  Medication Review (RN CM)   Complete

## 2022-06-20 NOTE — Progress Notes (Signed)
[facility] Gastroenterology Progress Note  [name] 51 y.o. 1971/07/03  CC: Intractable nausea vomiting   Subjective: Patient states yesterday she tolerated clear liquids without difficulty.  Though last night she had a difficult night with severe abdominal pain, nausea, and gagging.  ROS : Review of Systems  Eyes:  Negative for blurred vision, double vision and photophobia.  Gastrointestinal:  Positive for abdominal pain, nausea and vomiting. Negative for blood in stool, constipation, diarrhea, heartburn and melena.      Objective: Vital signs in last 24 hours: Vitals:   06/20/22 0930 06/20/22 1000  BP:    Pulse:    Resp: 16 20  Temp:    SpO2:      Physical Exam:  General:  Alert, cooperative, no distress, appears stated age  Head:  Normocephalic, without obvious abnormality, atraumatic  Eyes:  Anicteric sclera, EOM's intact  Lungs:   Clear to auscultation bilaterally, respirations unlabored  Heart:  Regular rate and rhythm, S1, S2 normal  Abdomen:   Soft, non-tender, bowel sounds active all four quadrants,  no masses,     Lab Results: Recent Labs    06/19/22 0015 06/19/22 0131 06/19/22 0500  NA 139 141 143  K 5.0 4.4 4.5  CL 107 107 108  CO2 23  --  26  GLUCOSE 296* 305* 301*  BUN 26* 28* 28*  CREATININE 2.52* 2.60* 2.53*  CALCIUM 8.9  --  8.7*   Recent Labs    06/19/22 0015  AST 20  ALT 8  ALKPHOS 70  BILITOT 1.6*  PROT 6.8  ALBUMIN 3.0*   Recent Labs    06/19/22 0015 06/19/22 0131 06/19/22 0500  WBC 7.8  --  8.7  NEUTROABS 6.6  --   --   HGB 9.7* 10.5* 8.8*  HCT 30.2* 31.0* 27.7*  MCV 86.8  --  87.7  PLT 375  --  322   No results for input(s): "LABPROT", "INR" in the last 72 hours.    Assessment Gastroparesis - Underwent EGD 05/23/2022 with Dr. Allen Norris which showed LA grade C reflux esophagitis with no bleeding, normal stomach, normal duodenum, no specimens collected -Lipase 23 -BUN 28, creatinine 2.53, GFR 22 - CT abdomen pelvis wo  contrast 10/19: Prominent thickened folds in the proximal to mid stomach, most likely severe gastritis.  Small hiatal hernia.  Constipation with diverticulosis.   Plan: Continued nausea and dry heaves.  Though tolerated clear liquid diet yesterday.  Can continue clear liquid diet as tolerated. Continue IV Reglan 3.  will ultimately need repeat gastric emptying scan once discharged and follow-up with University/tertiary care for consideration of further management to prevent recurrent hospital admissions 4.  [facility] will follow  Garnette Scheuermann PA-C 06/20/2022, 10:55 AM  Contact #  [phone]

## 2022-06-20 NOTE — Hospital Course (Signed)
PMH of type I BM, anxiety, asthma, depression, HFpEF chronic, present to the hospital with complaints of nausea and vomiting secondary to gastroparesis flareup. [facility] consulted.  Currently on IV Reglan.

## 2022-06-20 NOTE — Progress Notes (Signed)
Initial Nutrition Assessment  DOCUMENTATION CODES:   Severe malnutrition in context of chronic illness  INTERVENTION:  - Full Liquid diet with advancement to Soft diet tomorrow morning - per MD.  - Ensure Enlive po BID, each supplement provides 350 kcal and 20 grams of protein. - Encourage intake of small and frequent meals throughout the day as tolerated. - Continue antiemetics to aid in preventing nausea from interfering with intake.  - Will order daily multivitamin with minerals to support micronutrient needs.   NUTRITION DIAGNOSIS:   Severe Malnutrition related to chronic illness (gastoparesis) as evidenced by severe fat depletion, severe muscle depletion. n GOAL:   Patient will meet greater than or equal to 90% of their needs  MONITOR:   PO intake, Supplement acceptance, Diet advancement, Weight trends  REASON FOR ASSESSMENT:   Malnutrition Screening Tool    ASSESSMENT:   51 y.o. female with PMH T1DM, CHF, diabetic gastroparesis, and CKD 3-4 who presented with intractable nausea and vomiting. Has had symptoms on and off since August 2023.   Patient sitting up in bedside chair at time of visit. She reportsa UBW of 185# and weight loss beginning in August. Per EMR, patient weighed at 170# in August and dropped to 164# by October but weighed this admission back up to 167#. Patient states when she was diagnosed with gastroparesis several years ago she would eat 6 small meals a day and this worked well for her. Since her gastroparesis got worse back in August, she has instead been eating only 3 small meals. Food recall as below:  Breakfast: yogurt and banana Lunch: half a tuna or chicken salad wrap from [employer]: baked or boiled chicken and vegetables Snack: 1 Ensure   Patient reports great appetite but notes she is limited by reflux and N/V as well as fear about her gastroparesis.Notes she has tried to see an outpatient dietitian but the appointment was very  costly. She is hopeful to feel better and eat well so she can stop losing weight. Patient on clear liquids at time of visit but feels she could eat more solid food.  Patient discussed with MD. Discussed would recommend skipping the full liquid diet (as it is higher in fat) and going straight to a soft diet, which is lower in fat and low residue. Per MD, GI wanting patient to stay on liquid diet for now but can advance to soft in the morning.   Will follow patient closely and provide gastroparesis education, which patient seemed interested in.  Medications reviewed and include: Insulin, Reglan, Protonix, Phenergan   Labs reviewed:  Creatinine 2.53 HA1C 6.3 Blood glucose 88-196 x24 hours   NUTRITION - FOCUSED PHYSICAL EXAM:  Flowsheet Row Most Recent Value  Orbital Region Severe depletion  Upper Arm Region Severe depletion  Thoracic and Lumbar Region Moderate depletion  Buccal Region Moderate depletion  Temple Region Severe depletion  Clavicle Bone Region Moderate depletion  Clavicle and Acromion Bone Region Moderate depletion  Scapular Bone Region Moderate depletion  Dorsal Hand Moderate depletion  Patellar Region Severe depletion  Anterior Thigh Region Severe depletion  Posterior Calf Region Moderate depletion  Edema (RD Assessment) None  Hair Reviewed  [pt notes she has alopecia but has noticed her hair has been more dry the past few months]  Eyes Reviewed  Mouth Reviewed  Skin Reviewed  Nails Reviewed       Diet Order:   Diet Order  DIET SOFT Room service appropriate? Yes; Fluid consistency: Thin  Diet effective 0500                   EDUCATION NEEDS:   Education needs have been addressed (will provide education at next visit)  Skin:  Skin Assessment: Reviewed RN Assessment  Last BM:  11/15  Height:  Ht Readings from Last 1 Encounters:  06/19/22 6' (1.829 m)   Weight:  Wt Readings from Last 1 Encounters:  06/19/22 76 kg    BMI:  Body  mass index is 22.72 kg/m.  Estimated Nutritional Needs:  Kcal:  2150-2300 kcal Protein:  105-120 grams Fluid:  >/= 2.1L    Samson Frederic RD, LDN For contact information, refer to [facility].

## 2022-06-20 NOTE — Progress Notes (Signed)
[facility] Progress Note Patient: [name] VHQ:[id number] DOB: April 14, 1971 DOA: 06/18/2022  DOS: the patient was seen and examined on 06/20/2022  Brief hospital course: PMH of type I BM, anxiety, asthma, depression, HFpEF chronic, present to the hospital with complaints of nausea and vomiting secondary to gastroparesis flareup. [facility] consulted.  Currently on IV Reglan. Assessment and Plan: Diabetic gastroparesis. Minor hematemesis. Present to the hospital with nausea and vomiting. Secondary to diabetic gastroparesis flareup. Currently on IV Reglan. [facility] following. Tolerating clear liquid diet. Continue with gentle IV hydration. Patient was initially on Protonix drip for hematemesis currently on Protonix every 12 hours.  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 We will continue with every 4 hours sliding scale insulin. Monitor. Hold long-acting insulin.  HTN. Blood pressure stable. For now we will monitor.  CKD 3B. Renal function currently stable. Monitor. Continue with IV hydration.  Chronic HFpEF. Does not appear to be volume overloaded. Monitor for  Anemia of chronic kidney disease. Hemoglobin remaining stable for now. Monitor.   Subjective: No nausea no vomiting so far.  No abdominal pain.  Passing gas.  Having bowel movement.  No fever no chills.  Physical Exam: General: in mild distress;  Cardiovascular: S1 and S2 Present, no Murmur Respiratory: good respiratory effort, Bilateral Air entry present, no Crackles, no wheezes Abdomen: Bowel Sound present, Non tender  Extremities: no edema Neurology: alert and oriented to time, place, and person   Data Reviewed: I have Reviewed nursing notes, Vitals, and Lab results. Since last encounter, pertinent lab results CBC and BMP   . I have ordered test including CBC and BMP  .   Disposition: Status is: Inpatient Remains inpatient appropriate because: Still  with IV Reglan needed.  SCDs Start: 06/19/22 0309  Family Communication: No one at bedside Level of care: Med-Surg  Vitals:   06/20/22 0840 06/20/22 0930 06/20/22 1000 06/20/22 1353  BP: (!) 145/90   (!) 157/100  Pulse: 85   92  Resp: $Remo'16 16 20 18  'EdxzC$ Temp: 98.6 F (37 C)   97.9 F (36.6 C)  TempSrc: Oral   Oral  SpO2: 96%   100%  Weight:      Height:         Author: Berle Mull, MD 06/20/2022 4:11 PM  Please look on [email or web address] to find out who is on call.

## 2022-06-21 DIAGNOSIS — E43 Unspecified severe protein-calorie malnutrition: Secondary | ICD-10-CM | POA: Insufficient documentation

## 2022-06-21 DIAGNOSIS — R112 Nausea with vomiting, unspecified: Secondary | ICD-10-CM | POA: Diagnosis not present

## 2022-06-21 LAB — BASIC METABOLIC PANEL
Anion gap: 5 (ref 5–15)
BUN: 19 mg/dL (ref 6–20)
CO2: 25 mmol/L (ref 22–32)
Calcium: 8.1 mg/dL — ABNORMAL LOW (ref 8.9–10.3)
Chloride: 106 mmol/L (ref 98–111)
Creatinine, Ser: 2.77 mg/dL — ABNORMAL HIGH (ref 0.44–1.00)
GFR, Estimated: 20 mL/min — ABNORMAL LOW (ref >60)
Glucose, Bld: 175 mg/dL — ABNORMAL HIGH (ref 70–99)
Potassium: 4.3 mmol/L (ref 3.5–5.1)
Sodium: 136 mmol/L (ref 135–145)

## 2022-06-21 LAB — GLUCOSE, CAPILLARY
Glucose-Capillary: 134 mg/dL — ABNORMAL HIGH (ref 70–99)
Glucose-Capillary: 171 mg/dL — ABNORMAL HIGH (ref 70–99)
Glucose-Capillary: 131 mg/dL — ABNORMAL HIGH (ref 70–99)

## 2022-06-21 LAB — CBC WITH DIFFERENTIAL/PLATELET
Abs Immature Granulocytes: 0.02 10*3/uL (ref 0.00–0.07)
Basophils Absolute: 0 10*3/uL (ref 0.0–0.1)
Basophils Relative: 1 %
Eosinophils Absolute: 0.1 10*3/uL (ref 0.0–0.5)
Eosinophils Relative: 2 %
HCT: 26 % — ABNORMAL LOW (ref 36.0–46.0)
Hemoglobin: 8 g/dL — ABNORMAL LOW (ref 12.0–15.0)
Immature Granulocytes: 0 %
Lymphocytes Relative: 24 %
Lymphs Abs: 1.4 10*3/uL (ref 0.7–4.0)
MCH: 27.8 pg (ref 26.0–34.0)
MCHC: 30.8 g/dL (ref 30.0–36.0)
MCV: 90.3 fL (ref 80.0–100.0)
Monocytes Absolute: 0.2 10*3/uL (ref 0.1–1.0)
Monocytes Relative: 4 %
Neutro Abs: 4 10*3/uL (ref 1.7–7.7)
Neutrophils Relative %: 69 %
Platelets: 246 10*3/uL (ref 150–400)
RBC: 2.88 MIL/uL — ABNORMAL LOW (ref 3.87–5.11)
RDW: 15.6 % — ABNORMAL HIGH (ref 11.5–15.5)
WBC: 5.8 10*3/uL (ref 4.0–10.5)
nRBC: 0 % (ref 0.0–0.2)

## 2022-06-21 LAB — MAGNESIUM: Magnesium: 2 mg/dL (ref 1.7–2.4)

## 2022-06-21 NOTE — Plan of Care (Signed)
  Problem: Education: Goal: Ability to describe self-care measures that may prevent or decrease complications (Diabetes Survival Skills Education) will improve Outcome: Completed/Met Goal: Individualized Educational Video(s) Outcome: Completed/Met

## 2022-06-21 NOTE — Plan of Care (Signed)
  Problem: Education: Goal: Ability to describe self-care measures that may prevent or decrease complications (Diabetes Survival Skills Education) will improve Outcome: Completed/Met Goal: Individualized Educational Video(s) Outcome: Completed/Met   

## 2022-06-21 NOTE — Progress Notes (Signed)
Brief Nutrition Note  RD consulted for nutrition education regarding Nutrition Management for Gastroparesis.  Patient seen by RD yesterday. See note for full nutrition assessment.  Met with patient at bedside today who reports she tolerated her soft diet breakfast and lunch today with no nausea or vomiting. Provided patient with "Gastroparesis Nutrition Therapy" for the [employer] of [employer]. Discussed consuming 4-6 small meals a day focusing on low fat food options, foods low in fiber, and avoiding those that may cause reflux such as spicy or citrus foods.  Provided patient with list of foods recommended and foods not recommended and reviewed. Discussed dietary changes to aid in symptom relief.   Pt verbalizes understanding of information provided and appreciative of information.   Expect good compliance.  Body mass index is 22.72 Pt meets criteria for Normal weight based on current BMI.  Current diet order is Soft, patient is consuming approximately 100% of meals at this time. Labs and medications reviewed. RD contact information provided. If additional nutrition issues arise, please re-consult Rd.   Samson Frederic RD, LDN For contact information, refer to [facility].

## 2022-06-21 NOTE — Progress Notes (Signed)
Mobility Specialist - Progress Note   06/21/22 1047  Mobility  Activity Ambulated independently in hallway  Level of Assistance Independent  Assistive Device None  Distance Ambulated (ft) 350 ft  Range of Motion/Exercises Active  Activity Response Tolerated well  Mobility Referral Yes  $Mobility charge 1 Mobility   Pt was found on recliner chair and agreeable to ambulate. Had no complaints and at EOS returned to recliner chair with necessities in reach.  Ferd Hibbs Mobility Specialist

## 2022-06-21 NOTE — Discharge Summary (Signed)
Physician Discharge Summary   Patient: [name] MRN: [id number] DOB: 05/09/1971  Admit date:     06/18/2022  Discharge date: 06/21/22  Discharge Physician: Berle Mull  PCP: [name], PA  Recommendations at discharge: Follow-up with PCP as recommended. Follow-up with GI as planned.   Follow-up Information     [name], [place]. Schedule an appointment as soon as possible for a visit in 1 week(s).   Specialty: Internal Medicine Contact information: [address] [place] [address] [phone]                Discharge Diagnoses: Principal Problem:   Intractable nausea and vomiting Active Problems:   Diabetic gastroparesis ([place])   Coffee ground emesis   GI bleeding   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 (HCC)   Stage 3b chronic kidney disease (CKD) (HCC)   Essential hypertension   Chronic diastolic CHF (congestive heart failure) (HCC)   Gastroparesis   Protein-calorie malnutrition, severe  Hospital Course: PMH of type I BM, anxiety, asthma, depression, HFpEF chronic, present to the hospital with complaints of nausea and vomiting secondary to gastroparesis flareup. [facility] consulted.  Treated with IV Reglan.  Assessment and Plan  Diabetic gastroparesis. Minor hematemesis. Present to the hospital with nausea and vomiting. Secondary to diabetic gastroparesis flareup. Treated with IV Reglan. [facility] following. Tolerating diet. Was also given IV hydration. Initially was on Protonix drip currently on Protonix twice daily. Plan to go home with oral liquid Reglan.  GI will order the medicine to patient's pharmacy. Continue PPI on discharge. Dietitian provided education about gastroparesis diet.  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 Continue home insulin on discharge.  HTN. Blood pressure stable. For now we will monitor.   CKD 3B. Renal function currently  stable. Monitor.   Chronic HFpEF. Does not appear to be volume overloaded.   Anemia of chronic kidney disease. Hemoglobin remaining stable for now. Monitor.  Consultants:  [facility]  Procedures performed:  None  DISCHARGE MEDICATION: Allergies as of 06/21/2022       Reactions   Atorvastatin Other (See Comments)   Dizziness    Dulaglutide Nausea And Vomiting, Other (See Comments)   Caused pancreatitis    Zofran [ondansetron] Nausea And Vomiting, Other (See Comments)   Causes nausea vomiting to worsen / doesn't really work for patient.   Amoxicillin Itching, Rash   Lantus [insulin Glargine] Itching, Rash   Miconazole Nitrate Nausea And Vomiting, Rash        Medication List     STOP taking these medications    NovoLOG 100 UNIT/ML injection Generic drug: insulin aspart       TAKE these medications    acetaminophen 500 MG tablet Commonly known as: TYLENOL Take 1,000 mg by mouth 3 (three) times daily as needed for moderate pain.   albuterol 108 (90 Base) MCG/ACT inhaler Commonly known as: ProAir HFA Inhale 2 puffs into the lungs every 6 (six) hours as needed for wheezing or shortness of breath.   Ensure Max Protein Liqd Take 330 mLs (11 oz total) by mouth daily.   FreeStyle Libre 2 Sensor Misc Inject 1 Device into the skin every 14 (fourteen) days.   hydrOXYzine 50 MG tablet Commonly known as: ATARAX Take 50 mg by mouth in the morning, at noon, and at bedtime.   LORazepam 0.5 MG tablet Commonly known as: ATIVAN Take 1 tablet (0.5 mg total) by mouth every  8 (eight) hours as needed for anxiety (Refractory nausea and vomiting).   LUBRICATING EYE DROPS OP Place 1-2 drops into both eyes daily as needed (dry eyes).   metoCLOPramide 10 MG tablet Commonly known as: Reglan Take 1 tablet (10 mg total) by mouth 3 (three) times daily before meals.   metoprolol succinate 25 MG 24 hr tablet Commonly known as: Toprol XL Take 1 tablet (25 mg total) by mouth  daily.   multivitamin with minerals Tabs tablet Take 1 tablet by mouth daily.   pantoprazole 40 MG tablet Commonly known as: Protonix Take 1 tablet (40 mg total) by mouth 2 (two) times daily before a meal.   polyethylene glycol 17 g packet Commonly known as: MIRALAX / GLYCOLAX Take 17 g by mouth daily as needed for moderate constipation.   pregabalin 50 MG capsule Commonly known as: LYRICA Take 50 mg by mouth 2 (two) times daily as needed (neuropathy).   prochlorperazine 10 MG tablet Commonly known as: COMPAZINE Take 1 tablet (10 mg total) by mouth every 6 (six) hours as needed for nausea or vomiting.   promethazine 25 MG suppository Commonly known as: PHENERGAN Place 1 suppository (25 mg total) rectally every 6 (six) hours as needed for nausea or vomiting.   rosuvastatin 20 MG tablet Commonly known as: CRESTOR Take 1 tablet (20 mg total) by mouth daily.   scopolamine 1 MG/3DAYS Commonly known as: TRANSDERM-SCOP Place 1 patch (1.5 mg total) onto the skin every 3 (three) days.   tobramycin 0.3 % ophthalmic solution Commonly known as: TOBREX Place 1-2 drops into both eyes See admin instructions. Instill 1-2 drops into both eyes 3 times daily the day before, day of, and the day after every 6 weeks eye injections per patient   torsemide 20 MG tablet Commonly known as: DEMADEX Take 20 mg by mouth daily as needed (swelling).       Disposition: Home Diet recommendation: Regular diet  Discharge Exam: Vitals:   06/20/22 1353 06/20/22 1947 06/21/22 0540 06/21/22 1327  BP: (!) 157/100 (!) 144/81 (!) 150/84 (!) 150/92  Pulse: 92 93 92 97  Resp: $Remo'18 18 18 18  'FmDWc$ Temp: 97.9 F (36.6 C) 98.5 F (36.9 C) 98.3 F (36.8 C) 98.1 F (36.7 C)  TempSrc: Oral Oral Oral Oral  SpO2: 100% 99% 97% 100%  Weight:      Height:       General: Appear in no distress; no visible Abnormal Neck Mass Or lumps, Conjunctiva normal Cardiovascular: S1 and S2 Present, no Murmur, Respiratory: good  respiratory effort, Bilateral Air entry present and CTA, no Crackles, no wheezes Abdomen: Bowel Sound present, Non tender  Extremities: no Pedal edema Neurology: alert and oriented to time, place, and person  Filed Weights   06/18/22 2350 06/18/22 2352 06/19/22 1608  Weight: 74.8 kg 74.4 kg 76 kg   Condition at discharge: stable  The results of significant diagnostics from this hospitalization (including imaging, microbiology, ancillary and laboratory) are listed below for reference.   Imaging Studies: DG Chest Port 1 View  Result Date: 06/19/2022 CLINICAL DATA:  Vomiting EXAM: PORTABLE CHEST 1 VIEW COMPARISON:  05/21/2022 FINDINGS: Lungs are clear. No pneumothorax or pleural effusion. Cardiac size within normal limits. 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 Pulmonary vascularity is normal. No acute bone abnormality. IMPRESSION: 1. No active disease. Electronically Signed   By: Fidela Salisbury M.D.   On: 06/19/2022 01:34   CT ABDOMEN PELVIS WO CONTRAST  Result Date: 05/23/2022 CLINICAL DATA:  Abdominal pain, dizziness, nausea and vomiting onset 4 weeks ago. Relates personal history gastroparesis. EXAM: CT ABDOMEN AND PELVIS WITHOUT CONTRAST TECHNIQUE: Multidetector CT imaging of the abdomen and pelvis was performed following the standard protocol without IV contrast. RADIATION DOSE REDUCTION: This exam was performed according to the departmental dose-optimization program which includes automated exposure control, adjustment of the mA and/or kV according to patient size and/or use of iterative reconstruction technique. COMPARISON:  CTA chest, abdomen and pelvis 04/21/2022, CTA abdomen and pelvis 08/24/2020 FINDINGS: Lower chest: There is a small increased opacity anteriorly in the base of the anterior segment of the right lower lobe which could be atelectasis or a small pneumonia. Lung bases are otherwise clear. The cardiac size is normal. Small hiatal hernia. There is  spray artifact from pacemaker wiring in the heart. Small pleural effusions noted on the last CT have resolved since. Hepatobiliary: The liver is unremarkable without contrast. Gallbladder and bile ducts are unremarkable. Pancreas: Unremarkable without contrast. Spleen: Unremarkable without contrast. 1.7 cm splenule again noted anterior to the spleen. Adrenals/Urinary Tract: There is no adrenal mass. No focal abnormality in the unenhanced renal cortex. There is no urinary stone or obstruction, no focal bladder thickening or intravesical stone. Stomach/Bowel: There are increasingly thickened folds in the proximal to mid stomach. This is probably due to severe gastritis but endoscopic follow-up may be warranted to exclude infiltrating disease. There is no dilatation or wall thickening in the small bowel, appendix and large bowel, with moderate fecal stasis again shown and uncomplicated diverticulosis. Vascular/Lymphatic: There is trace aortic calcific plaque, minimal plaque in the right internal iliac artery. There is no AAA. There is no new adenopathy with bilateral scattered mildly prominent inguinal chain nodes stable in comparison. No retroperitoneal adenopathy is seen. Reproductive: Uterus and bilateral adnexa are unremarkable. Other: Small umbilical fat hernia. No incarcerated hernia. There is no free air, free hemorrhage or free fluid. Musculoskeletal: Lumbar spondylosis and slight levoscoliosis. No acute or significant osseous findings. No worrisome regional bone lesion. IMPRESSION: 1. Increasingly prominent thickened folds in the proximal to mid stomach. Most likely due to severe gastritis. Endoscopy may be indicated to exclude other etiologies. 2. Small hiatal hernia. 3. Constipation with diverticulosis. 4. Small increased opacity in the anterior base of the anterior basal right lower lobe segment, could be atelectasis or a small pneumonia. 5. Stable mildly prominent inguinal chain nodes. No new or worsening  adenopathy. 6. Umbilical fat hernia. Electronically Signed   By: Telford Nab M.D.   On: 05/23/2022 05:32    Microbiology: Results for orders placed or performed during the hospital encounter of 05/21/22  Resp Panel by RT-PCR (Flu A&B, Covid) Anterior Nasal Swab     Status: None   Collection Time: 05/21/22  8:33 AM   Specimen: Anterior Nasal Swab  Result Value Ref Range Status   SARS Coronavirus 2 by RT PCR NEGATIVE NEGATIVE Final    Comment: (NOTE) SARS-CoV-2 target nucleic acids are NOT DETECTED.  The SARS-CoV-2 RNA is generally detectable in upper respiratory specimens during the acute phase of infection. The lowest concentration of SARS-CoV-2 viral copies this assay can detect is 138 copies/mL. A negative result does not preclude SARS-Cov-2 infection and should not be used as the sole basis for treatment or other patient management decisions. A negative result may occur with  improper specimen collection/handling, submission of specimen other than nasopharyngeal swab, presence of viral mutation(s) within the areas targeted by this assay, and inadequate number of viral copies(<138 copies/mL). A  negative result must be combined with clinical observations, patient history, and epidemiological information. The expected result is Negative.  Fact Sheet for Patients:  EntrepreneurPulse.com.au  Fact Sheet for Healthcare Providers:  IncredibleEmployment.be  This test is no t yet approved or cleared by the [place] FDA and  has been authorized for detection and/or diagnosis of SARS-CoV-2 by FDA under an Emergency Use Authorization (EUA). This EUA will remain  in effect (meaning this test can be used) for the duration of the COVID-19 declaration under Section 564(b)(1) of the Act, 21 U.S.C.section 360bbb-3(b)(1), unless the authorization is terminated  or revoked sooner.       Influenza A by PCR NEGATIVE NEGATIVE Final   Influenza B by PCR  NEGATIVE NEGATIVE Final    Comment: (NOTE) The Xpert Xpress SARS-CoV-2/FLU/RSV plus assay is intended as an aid in the diagnosis of influenza from Nasopharyngeal swab specimens and should not be used as a sole basis for treatment. Nasal washings and aspirates are unacceptable for Xpert Xpress SARS-CoV-2/FLU/RSV testing.  Fact Sheet for Patients: EntrepreneurPulse.com.au  Fact Sheet for Healthcare Providers: IncredibleEmployment.be  This test is not yet approved or cleared by the [place] FDA and has been authorized for detection and/or diagnosis of SARS-CoV-2 by FDA under an Emergency Use Authorization (EUA). This EUA will remain in effect (meaning this test can be used) for the duration of the COVID-19 declaration under Section 564(b)(1) of the Act, 21 U.S.C. section 360bbb-3(b)(1), unless the authorization is terminated or revoked.  Performed at [facility], [address] [place], [place] [address]    Labs: CBC: Recent Labs  Lab 06/19/22 0015 06/19/22 0131 06/19/22 0500 06/21/22 0759  WBC 7.8  --  8.7 5.8  NEUTROABS 6.6  --   --  4.0  HGB 9.7* 10.5* 8.8* 8.0*  HCT 30.2* 31.0* 27.7* 26.0*  MCV 86.8  --  87.7 90.3  PLT 375  --  322 415   Basic Metabolic Panel: Recent Labs  Lab 06/19/22 0015 06/19/22 0131 06/19/22 0500 06/21/22 0759  NA 139 141 143 136  K 5.0 4.4 4.5 4.3  CL 107 107 108 106  CO2 23  --  26 25  GLUCOSE 296* 305* 301* 175*  BUN 26* 28* 28* 19  CREATININE 2.52* 2.60* 2.53* 2.77*  CALCIUM 8.9  --  8.7* 8.1*  MG  --   --   --  2.0   Liver Function Tests: Recent Labs  Lab 06/19/22 0015  AST 20  ALT 8  ALKPHOS 70  BILITOT 1.6*  PROT 6.8  ALBUMIN 3.0*   CBG: Recent Labs  Lab 06/20/22 1942 06/20/22 2356 06/21/22 0410 06/21/22 0748 06/21/22 1148  GLUCAP 123* 124* 134* 171* 131*    Discharge time spent: greater than 30 minutes.  Signed: Berle Mull, MD [facility]

## 2022-06-21 NOTE — TOC Transition Note (Signed)
Transition of Care [facility]) - CM/SW Discharge Note   Patient Details  Name: [name] MRN: [id number] Date of Birth: 1971-06-22  Transition of Care [facility]) CM/SW Contact:  Leeroy Cha, RN Phone Number: 06/21/2022, 1:07 PM   Clinical Narrative:    [id number]/patient discharged to return home.  Chart reviewed for TOC needs.  None found.  Patient self care.   Final next level of care: Home/Self Care Barriers to Discharge: Barriers Resolved   Patient Goals and CMS Choice Patient states their goals for this hospitalization and ongoing recovery are:: to return to my home CMS Medicare.gov Compare Post Acute Care list provided to:: Patient    Discharge Placement                       Discharge Plan and Services   Discharge Planning Services: CM Consult                                 Social Determinants of Health (SDOH) Interventions     Readmission Risk Interventions   Row Labels 06/21/2022    9:35 AM 04/26/2022   10:53 AM  Readmission Risk Prevention Plan   Section Header. No data exists in this row.    Transportation Screening   Complete Complete  PCP or Specialist Appt within 5-7 Days    Complete  Home Care Screening    Complete  Medication Review (RN CM)    Complete  Medication Review [employer])   Complete   PCP or Specialist appointment within 3-5 days of discharge   Complete   HRI or Home Care Consult   Complete   SW Recovery Care/Counseling Consult   Complete   [place]   Complete

## 2022-06-21 NOTE — Progress Notes (Signed)
[facility] Gastroenterology Progress Note  [name] 51 y.o. [birth year]  CC:  gastroparesis   Subjective: Patient states she is doing well today.  Had some diarrhea last night but no episodes of nausea or vomiting.  Tolerating soft diet this morning of omelette  ROS : Review of Systems  Constitutional:  Negative for chills, fever and weight loss.  Gastrointestinal:  Positive for diarrhea. Negative for abdominal pain, blood in stool, constipation, heartburn, melena, nausea and vomiting.      Objective: Vital signs in last 24 hours: Vitals:   06/20/22 1947 06/21/22 0540  BP: (!) 144/81 (!) 150/84  Pulse: 93 92  Resp: 18 18  Temp: 98.5 F (36.9 C) 98.3 F (36.8 C)  SpO2: 99% 97%    Physical Exam:  General:  Alert, cooperative, no distress, appears stated age  Head:  Normocephalic, without obvious abnormality, atraumatic  Eyes:  Anicteric sclera, EOM's intact  Lungs:   Clear to auscultation bilaterally, respirations unlabored  Heart:  Regular rate and rhythm, S1, S2 normal  Abdomen:   Soft, non-tender, bowel sounds active all four quadrants,  no masses,   Extremities: Extremities normal, atraumatic, no  edema  Pulses: 2+ and symmetric    Lab Results: Recent Labs    06/19/22 0500 06/21/22 0759  NA 143 136  K 4.5 4.3  CL 108 106  CO2 26 25  GLUCOSE 301* 175*  BUN 28* 19  CREATININE 2.53* 2.77*  CALCIUM 8.7* 8.1*  MG  --  2.0   Recent Labs    06/19/22 0015  AST 20  ALT 8  ALKPHOS 70  BILITOT 1.6*  PROT 6.8  ALBUMIN 3.0*   Recent Labs    06/19/22 0015 06/19/22 0131 06/19/22 0500 06/21/22 0759  WBC 7.8  --  8.7 5.8  NEUTROABS 6.6  --   --  4.0  HGB 9.7*   < > 8.8* 8.0*  HCT 30.2*   < > 27.7* 26.0*  MCV 86.8  --  87.7 90.3  PLT 375  --  322 246   < > = values in this interval not displayed.   No results for input(s): "LABPROT", "INR" in the last 72 hours.    Assessment Gastroparesis - Underwent EGD 05/23/2022 with Dr. Allen Norris which showed LA  grade C reflux esophagitis with no bleeding, normal stomach, normal duodenum, no specimens collected -Lipase 23 -BUN 28, creatinine 2.53, GFR 22 - CT abdomen pelvis wo contrast 10/19: Prominent thickened folds in the proximal to mid stomach, most likely severe gastritis.  Small hiatal hernia.  Constipation with diverticulosis.   Plan: Nausea and vomiting has improved and patient is tolerating diet well.  Consult put in for dietitian in regards to gastroparesis diet moving forward. Recommend getting gastric emptying study as an outpatient while patient is feeling well.  Currently working on getting her scheduled for that. Supportive care [facility] will follow  Garnette Scheuermann PA-C 06/21/2022, 9:35 AM  Contact #  [phone]

## 2022-06-24 DIAGNOSIS — R634 Abnormal weight loss: Secondary | ICD-10-CM | POA: Diagnosis not present

## 2022-06-24 DIAGNOSIS — R112 Nausea with vomiting, unspecified: Secondary | ICD-10-CM | POA: Diagnosis not present

## 2022-07-02 ENCOUNTER — Inpatient Hospital Stay (HOSPITAL_COMMUNITY): Admit: 2022-07-02 | Payer: BC Managed Care – PPO

## 2022-07-02 ENCOUNTER — Encounter (HOSPITAL_COMMUNITY): Payer: Self-pay

## 2022-07-03 DIAGNOSIS — H2513 Age-related nuclear cataract, bilateral: Secondary | ICD-10-CM | POA: Diagnosis not present

## 2022-07-03 DIAGNOSIS — E113513 Type 2 diabetes mellitus with proliferative diabetic retinopathy with macular edema, bilateral: Secondary | ICD-10-CM | POA: Diagnosis not present

## 2022-07-03 DIAGNOSIS — H43823 Vitreomacular adhesion, bilateral: Secondary | ICD-10-CM | POA: Diagnosis not present

## 2022-07-08 ENCOUNTER — Other Ambulatory Visit (HOSPITAL_COMMUNITY): Payer: Self-pay | Admitting: Gastroenterology

## 2022-07-08 DIAGNOSIS — K3184 Gastroparesis: Secondary | ICD-10-CM

## 2022-07-10 ENCOUNTER — Other Ambulatory Visit: Payer: Self-pay

## 2022-07-10 ENCOUNTER — Emergency Department (HOSPITAL_COMMUNITY)
Admission: EM | Admit: 2022-07-10 | Discharge: 2022-07-10 | Discharge status: Against Medical Advice | Payer: BC Managed Care – PPO | Source: Home / Self Care

## 2022-07-10 ENCOUNTER — Emergency Department (HOSPITAL_BASED_OUTPATIENT_CLINIC_OR_DEPARTMENT_OTHER)
Admission: EM | Admit: 2022-07-10 | Discharge: 2022-07-10 | Disposition: A | Discharge status: Home / Self Care | Payer: BC Managed Care – PPO | Source: Home / Self Care | Attending: Emergency Medicine | Admitting: Emergency Medicine

## 2022-07-10 ENCOUNTER — Encounter (HOSPITAL_BASED_OUTPATIENT_CLINIC_OR_DEPARTMENT_OTHER): Payer: Self-pay

## 2022-07-10 DIAGNOSIS — I5033 Acute on chronic diastolic (congestive) heart failure: Secondary | ICD-10-CM | POA: Diagnosis not present

## 2022-07-10 DIAGNOSIS — K3184 Gastroparesis: Secondary | ICD-10-CM | POA: Diagnosis not present

## 2022-07-10 DIAGNOSIS — F32A Depression, unspecified: Secondary | ICD-10-CM | POA: Diagnosis not present

## 2022-07-10 DIAGNOSIS — I10 Essential (primary) hypertension: Secondary | ICD-10-CM | POA: Diagnosis not present

## 2022-07-10 DIAGNOSIS — E1043 Type 1 diabetes mellitus with diabetic autonomic (poly)neuropathy: Secondary | ICD-10-CM | POA: Insufficient documentation

## 2022-07-10 DIAGNOSIS — I130 Hypertensive heart and chronic kidney disease with heart failure and stage 1 through stage 4 chronic kidney disease, or unspecified chronic kidney disease: Secondary | ICD-10-CM | POA: Diagnosis not present

## 2022-07-10 DIAGNOSIS — E10649 Type 1 diabetes mellitus with hypoglycemia without coma: Secondary | ICD-10-CM | POA: Diagnosis not present

## 2022-07-10 DIAGNOSIS — R059 Cough, unspecified: Secondary | ICD-10-CM | POA: Diagnosis not present

## 2022-07-10 DIAGNOSIS — E1143 Type 2 diabetes mellitus with diabetic autonomic (poly)neuropathy: Secondary | ICD-10-CM | POA: Diagnosis not present

## 2022-07-10 DIAGNOSIS — Z79899 Other long term (current) drug therapy: Secondary | ICD-10-CM | POA: Insufficient documentation

## 2022-07-10 DIAGNOSIS — R1110 Vomiting, unspecified: Secondary | ICD-10-CM | POA: Insufficient documentation

## 2022-07-10 DIAGNOSIS — R0789 Other chest pain: Secondary | ICD-10-CM | POA: Diagnosis not present

## 2022-07-10 DIAGNOSIS — R079 Chest pain, unspecified: Secondary | ICD-10-CM | POA: Diagnosis not present

## 2022-07-10 DIAGNOSIS — I160 Hypertensive urgency: Secondary | ICD-10-CM | POA: Diagnosis not present

## 2022-07-10 DIAGNOSIS — J45909 Unspecified asthma, uncomplicated: Secondary | ICD-10-CM | POA: Diagnosis not present

## 2022-07-10 DIAGNOSIS — Z823 Family history of stroke: Secondary | ICD-10-CM | POA: Diagnosis not present

## 2022-07-10 DIAGNOSIS — J9601 Acute respiratory failure with hypoxia: Secondary | ICD-10-CM | POA: Diagnosis not present

## 2022-07-10 DIAGNOSIS — Z9641 Presence of insulin pump (external) (internal): Secondary | ICD-10-CM | POA: Diagnosis present

## 2022-07-10 DIAGNOSIS — I509 Heart failure, unspecified: Secondary | ICD-10-CM | POA: Insufficient documentation

## 2022-07-10 DIAGNOSIS — Z8042 Family history of malignant neoplasm of prostate: Secondary | ICD-10-CM | POA: Diagnosis not present

## 2022-07-10 DIAGNOSIS — D631 Anemia in chronic kidney disease: Secondary | ICD-10-CM | POA: Diagnosis not present

## 2022-07-10 DIAGNOSIS — Z803 Family history of malignant neoplasm of breast: Secondary | ICD-10-CM | POA: Diagnosis not present

## 2022-07-10 DIAGNOSIS — R7989 Other specified abnormal findings of blood chemistry: Secondary | ICD-10-CM | POA: Diagnosis not present

## 2022-07-10 DIAGNOSIS — J811 Chronic pulmonary edema: Secondary | ICD-10-CM | POA: Diagnosis not present

## 2022-07-10 DIAGNOSIS — E440 Moderate protein-calorie malnutrition: Secondary | ICD-10-CM | POA: Diagnosis not present

## 2022-07-10 DIAGNOSIS — N179 Acute kidney failure, unspecified: Secondary | ICD-10-CM | POA: Diagnosis not present

## 2022-07-10 DIAGNOSIS — T501X6A Underdosing of loop [high-ceiling] diuretics, initial encounter: Secondary | ICD-10-CM | POA: Diagnosis present

## 2022-07-10 DIAGNOSIS — N184 Chronic kidney disease, stage 4 (severe): Secondary | ICD-10-CM | POA: Diagnosis not present

## 2022-07-10 DIAGNOSIS — R112 Nausea with vomiting, unspecified: Secondary | ICD-10-CM | POA: Diagnosis not present

## 2022-07-10 DIAGNOSIS — R000 Tachycardia, unspecified: Secondary | ICD-10-CM | POA: Diagnosis not present

## 2022-07-10 DIAGNOSIS — R778 Other specified abnormalities of plasma proteins: Secondary | ICD-10-CM | POA: Diagnosis not present

## 2022-07-10 DIAGNOSIS — R1010 Upper abdominal pain, unspecified: Secondary | ICD-10-CM | POA: Insufficient documentation

## 2022-07-10 DIAGNOSIS — Z1152 Encounter for screening for COVID-19: Secondary | ICD-10-CM | POA: Diagnosis not present

## 2022-07-10 DIAGNOSIS — J90 Pleural effusion, not elsewhere classified: Secondary | ICD-10-CM | POA: Diagnosis not present

## 2022-07-10 DIAGNOSIS — D649 Anemia, unspecified: Secondary | ICD-10-CM | POA: Diagnosis not present

## 2022-07-10 DIAGNOSIS — E1022 Type 1 diabetes mellitus with diabetic chronic kidney disease: Secondary | ICD-10-CM | POA: Diagnosis not present

## 2022-07-10 DIAGNOSIS — Z800 Family history of malignant neoplasm of digestive organs: Secondary | ICD-10-CM | POA: Diagnosis not present

## 2022-07-10 DIAGNOSIS — Z8041 Family history of malignant neoplasm of ovary: Secondary | ICD-10-CM | POA: Diagnosis not present

## 2022-07-10 DIAGNOSIS — R0602 Shortness of breath: Secondary | ICD-10-CM | POA: Diagnosis not present

## 2022-07-10 DIAGNOSIS — E1040 Type 1 diabetes mellitus with diabetic neuropathy, unspecified: Secondary | ICD-10-CM | POA: Diagnosis not present

## 2022-07-10 DIAGNOSIS — Z801 Family history of malignant neoplasm of trachea, bronchus and lung: Secondary | ICD-10-CM | POA: Diagnosis not present

## 2022-07-10 LAB — CBC WITH DIFFERENTIAL/PLATELET
Abs Immature Granulocytes: 0.03 10*3/uL (ref 0.00–0.07)
Basophils Absolute: 0 10*3/uL (ref 0.0–0.1)
Basophils Relative: 0 %
Eosinophils Absolute: 0 10*3/uL (ref 0.0–0.5)
Eosinophils Relative: 0 %
HCT: 26.8 % — ABNORMAL LOW (ref 36.0–46.0)
Hemoglobin: 8.7 g/dL — ABNORMAL LOW (ref 12.0–15.0)
Immature Granulocytes: 0 %
Lymphocytes Relative: 9 %
Lymphs Abs: 0.6 10*3/uL — ABNORMAL LOW (ref 0.7–4.0)
MCH: 28 pg (ref 26.0–34.0)
MCHC: 32.5 g/dL (ref 30.0–36.0)
MCV: 86.2 fL (ref 80.0–100.0)
Monocytes Absolute: 0.1 10*3/uL (ref 0.1–1.0)
Monocytes Relative: 1 %
Neutro Abs: 6.6 10*3/uL (ref 1.7–7.7)
Neutrophils Relative %: 90 %
Platelets: 288 10*3/uL (ref 150–400)
RBC: 3.11 MIL/uL — ABNORMAL LOW (ref 3.87–5.11)
RDW: 16 % — ABNORMAL HIGH (ref 11.5–15.5)
WBC: 7.4 10*3/uL (ref 4.0–10.5)
nRBC: 0 % (ref 0.0–0.2)

## 2022-07-10 LAB — CBC
HCT: 26.1 % — ABNORMAL LOW (ref 36.0–46.0)
Hemoglobin: 8.3 g/dL — ABNORMAL LOW (ref 12.0–15.0)
MCH: 28.1 pg (ref 26.0–34.0)
MCHC: 31.8 g/dL (ref 30.0–36.0)
MCV: 88.5 fL (ref 80.0–100.0)
Platelets: 262 10*3/uL (ref 150–400)
RBC: 2.95 MIL/uL — ABNORMAL LOW (ref 3.87–5.11)
RDW: 15.9 % — ABNORMAL HIGH (ref 11.5–15.5)
WBC: 7.3 10*3/uL (ref 4.0–10.5)
nRBC: 0 % (ref 0.0–0.2)

## 2022-07-10 LAB — URINALYSIS, ROUTINE W REFLEX MICROSCOPIC
Bacteria, UA: NONE SEEN
Bilirubin Urine: NEGATIVE
Glucose, UA: >=500 mg/dL — AB
Hgb urine dipstick: NEGATIVE
Ketones, ur: NEGATIVE mg/dL
Leukocytes,Ua: NEGATIVE
Nitrite: NEGATIVE
Protein, ur: >=300 mg/dL — AB
Specific Gravity, Urine: 1.015 (ref 1.005–1.030)
pH: 7 (ref 5.0–8.0)

## 2022-07-10 LAB — COMPREHENSIVE METABOLIC PANEL
ALT: 21 U/L (ref 0–44)
ALT: 17 U/L (ref 0–44)
AST: 13 U/L — ABNORMAL LOW (ref 15–41)
AST: 11 U/L — ABNORMAL LOW (ref 15–41)
Albumin: 2.9 g/dL — ABNORMAL LOW (ref 3.5–5.0)
Albumin: 3.3 g/dL — ABNORMAL LOW (ref 3.5–5.0)
Alkaline Phosphatase: 86 U/L (ref 38–126)
Alkaline Phosphatase: 83 U/L (ref 38–126)
Anion gap: 8 (ref 5–15)
Anion gap: 13 (ref 5–15)
BUN: 26 mg/dL — ABNORMAL HIGH (ref 6–20)
BUN: 26 mg/dL — ABNORMAL HIGH (ref 6–20)
CO2: 21 mmol/L — ABNORMAL LOW (ref 22–32)
CO2: 21 mmol/L — ABNORMAL LOW (ref 22–32)
Calcium: 8.4 mg/dL — ABNORMAL LOW (ref 8.9–10.3)
Calcium: 8.6 mg/dL — ABNORMAL LOW (ref 8.9–10.3)
Chloride: 111 mmol/L (ref 98–111)
Chloride: 108 mmol/L (ref 98–111)
Creatinine, Ser: 2.76 mg/dL — ABNORMAL HIGH (ref 0.44–1.00)
Creatinine, Ser: 2.75 mg/dL — ABNORMAL HIGH (ref 0.44–1.00)
GFR, Estimated: 20 mL/min — ABNORMAL LOW (ref >60)
GFR, Estimated: 20 mL/min — ABNORMAL LOW (ref >60)
Glucose, Bld: 223 mg/dL — ABNORMAL HIGH (ref 70–99)
Glucose, Bld: 258 mg/dL — ABNORMAL HIGH (ref 70–99)
Potassium: 4.2 mmol/L (ref 3.5–5.1)
Potassium: 4.1 mmol/L (ref 3.5–5.1)
Sodium: 140 mmol/L (ref 135–145)
Sodium: 142 mmol/L (ref 135–145)
Total Bilirubin: 1.6 mg/dL — ABNORMAL HIGH (ref 0.3–1.2)
Total Bilirubin: 1.5 mg/dL — ABNORMAL HIGH (ref 0.3–1.2)
Total Protein: 6.1 g/dL — ABNORMAL LOW (ref 6.5–8.1)
Total Protein: 6.3 g/dL — ABNORMAL LOW (ref 6.5–8.1)

## 2022-07-10 LAB — CBG MONITORING, ED: Glucose-Capillary: 213 mg/dL — ABNORMAL HIGH (ref 70–99)

## 2022-07-10 LAB — LIPASE, BLOOD
Lipase: 22 U/L (ref 11–51)
Lipase: <10 U/L — ABNORMAL LOW (ref 11–51)

## 2022-07-10 MED ORDER — METOCLOPRAMIDE HCL 5 MG/ML IJ SOLN
5.0000 mg | Freq: Once | INTRAMUSCULAR | Status: AC
  Administered 2022-07-10: 5 mg via INTRAVENOUS
  Filled 2022-07-10: qty 2

## 2022-07-10 MED ORDER — SODIUM CHLORIDE 0.9 % IV BOLUS
1000.0000 mL | Freq: Once | INTRAVENOUS | Status: AC
  Administered 2022-07-10: 1000 mL via INTRAVENOUS

## 2022-07-10 MED ORDER — DIPHENHYDRAMINE HCL 25 MG PO CAPS
25.0000 mg | ORAL_CAPSULE | Freq: Once | ORAL | Status: AC
  Administered 2022-07-10: 25 mg via ORAL
  Filled 2022-07-10: qty 1

## 2022-07-10 NOTE — ED Provider Notes (Signed)
[facility]GENCY DEPT Provider Note   CSN: [id number] Arrival date & time: 07/10/22  0502     History {Add pertinent medical, surgical, social history, OB history to HPI:1} Chief Complaint  Patient presents with   Vomiting    [name] is a 51 y.o. female.  HPI     This is a 51 year old female with history of gastroparesis and multiple visits for the same who presents with nausea and vomiting.  Patient reports she has had nausea and vomiting since Monday.  She was taken off of her Reglan last week and started on Phenergan.  She states that these are not working.  She has a gastric emptying study for next Tuesday.  Patient reports epigastric abdominal pain and ongoing nonbilious, nonbloody emesis.  This is similar to her prior evaluations.  Home Medications Prior to Admission medications   Medication Sig Start Date End Date Taking? Authorizing Provider  acetaminophen (TYLENOL) 500 MG tablet Take 1,000 mg by mouth 3 (three) times daily as needed for moderate pain.    [provider]  albuterol (PROAIR HFA) 108 (90 Base) MCG/ACT inhaler Inhale 2 puffs into the lungs every 6 (six) hours as needed for wheezing or shortness of breath. 07/03/20   [name], PA-C  Carboxymethylcellul-Glycerin (LUBRICATING EYE DROPS OP) Place 1-2 drops into both eyes daily as needed (dry eyes).    [provider]  Continuous Blood Gluc Sensor (FREESTYLE LIBRE 2 SENSOR) MISC Inject 1 Device into the skin every 14 (fourteen) days. 03/20/20   [provider]  Ensure Max Protein (ENSURE MAX PROTEIN) LIQD Take 330 mLs (11 oz total) by mouth daily. 04/30/22   Hongalgi, [name], MD  hydrOXYzine (ATARAX) 50 MG tablet Take 50 mg by mouth in the morning, at noon, and at bedtime.    [provider]  LORazepam (ATIVAN) 0.5 MG tablet Take 1 tablet (0.5 mg total) by mouth every 8 (eight) hours as needed for anxiety (Refractory nausea and vomiting). 05/24/22    Rai, [name], MD  metoCLOPramide (REGLAN) 10 MG tablet Take 1 tablet (10 mg total) by mouth 3 (three) times daily before meals. 05/24/22   Rai, [name], MD  metoprolol succinate (TOPROL XL) 25 MG 24 hr tablet Take 1 tablet (25 mg total) by mouth daily. 04/19/22   [name], PA-C  Multiple Vitamin (MULTIVITAMIN WITH MINERALS) TABS tablet Take 1 tablet by mouth daily. 04/30/22   Hongalgi, [name], MD  pantoprazole (PROTONIX) 40 MG tablet Take 1 tablet (40 mg total) by mouth 2 (two) times daily before a meal. 05/24/22 07/23/22  Rai, Ripudeep K, MD  polyethylene glycol (MIRALAX / GLYCOLAX) 17 g packet Take 17 g by mouth daily as needed for moderate constipation.    [provider]  pregabalin (LYRICA) 50 MG capsule Take 50 mg by mouth 2 (two) times daily as needed (neuropathy). 12/05/21   [provider]  prochlorperazine (COMPAZINE) 10 MG tablet Take 1 tablet (10 mg total) by mouth every 6 (six) hours as needed for nausea or vomiting. 05/24/22   Rai, [name], MD  promethazine (PHENERGAN) 25 MG suppository Place 1 suppository (25 mg total) rectally every 6 (six) hours as needed for nausea or vomiting. 05/24/22   Rai, Ripudeep K, MD  rosuvastatin (CRESTOR) 20 MG tablet Take 1 tablet (20 mg total) by mouth daily. 05/24/22 09/21/22  Rai, [name], MD  scopolamine (TRANSDERM-SCOP) 1 MG/3DAYS Place 1 patch (1.5 mg total) onto the skin every  3 (three) days. 05/26/22   Rai, Ripudeep K, MD  tobramycin (TOBREX) 0.3 % ophthalmic solution Place 1-2 drops into both eyes See admin instructions. Instill 1-2 drops into both eyes 3 times daily the day before, day of, and the day after every 6 weeks eye injections per patient 11/17/20   [provider]  torsemide (DEMADEX) 20 MG tablet Take 20 mg by mouth daily as needed (swelling). 12/25/21   [provider]      Allergies    Atorvastatin, Dulaglutide, Zofran [ondansetron], Amoxicillin, Lantus [insulin glargine], and  Miconazole nitrate    Review of Systems   Review of Systems  Constitutional:  Negative for fever.  Gastrointestinal:  Positive for abdominal pain, nausea and vomiting.  All other systems reviewed and are negative.   Physical Exam Updated Vital Signs BP (!) 195/123   Pulse (!) 104   Temp 98.7 F (37.1 C) (Oral)   Resp (!) 24   Ht 1.829 m (6')   Wt 81.6 kg   LMP 11/17/2021 (Approximate)   SpO2 93%   BMI 24.41 kg/m  Physical Exam Vitals and nursing note reviewed.  Constitutional:      Appearance: She is well-developed.     Comments: Chronically ill-appearing but nontoxic  HENT:     Head: Normocephalic and atraumatic.     Mouth/Throat:     Mouth: Mucous membranes are dry.  Eyes:     Pupils: Pupils are equal, round, and reactive to light.  Cardiovascular:     Rate and Rhythm: Regular rhythm. Tachycardia present.     Heart sounds: Normal heart sounds.  Pulmonary:     Effort: Pulmonary effort is normal. No respiratory distress.     Breath sounds: No wheezing.  Abdominal:     General: Bowel sounds are normal.     Palpations: Abdomen is soft.     Tenderness: There is abdominal tenderness. There is no guarding or rebound.  Musculoskeletal:     Cervical back: Neck supple.  Skin:    General: Skin is warm and dry.  Neurological:     Mental Status: She is alert and oriented to person, place, and time.  Psychiatric:        Mood and Affect: Mood normal.     ED Results / Procedures / Treatments   Labs (all labs ordered are listed, but only abnormal results are displayed) Labs Reviewed  CBG MONITORING, ED - Abnormal; Notable for the following components:      Result Value   Glucose-Capillary 213 (*)    All other components within normal limits  CBC WITH DIFFERENTIAL/PLATELET  COMPREHENSIVE METABOLIC PANEL  LIPASE, BLOOD  URINALYSIS, ROUTINE W REFLEX MICROSCOPIC  CBC WITH DIFFERENTIAL/PLATELET    EKG None  Radiology No results found.  Procedures Ultrasound ED  Peripheral IV (Provider)  Date/Time: 07/10/2022 6:16 AM  Performed by: Shon Baton, MD Authorized by: Shon Baton, MD   Procedure details:    Indications: hydration and multiple failed IV attempts     Skin Prep: chlorhexidine gluconate     Location:  Left AC   Angiocath:  20 G   Bedside Ultrasound Guided: Yes     Images: not archived     Patient tolerated procedure without complications: Yes     Dressing applied: Yes     {Document cardiac monitor, telemetry assessment procedure when appropriate:1}  Medications Ordered in ED Medications  sodium chloride 0.9 % bolus 1,000 mL (1,000 mLs Intravenous New Bag/Given 07/10/22 0612)  metoCLOPramide (REGLAN) injection 5 mg (5 mg Intravenous Given 07/10/22 3976)    ED Course/ Medical Decision Making/ A&P                           Medical Decision Making Amount and/or Complexity of Data Reviewed Labs: ordered.  Risk Prescription drug management.   ***  {Document critical care time when appropriate:1} {Document review of labs and clinical decision tools ie heart score, Chads2Vasc2 etc:1}  {Document your independent review of radiology images, and any outside records:1} {Document your discussion with family members, caretakers, and with consultants:1} {Document social determinants of health affecting pt's care:1} {Document your decision making why or why not admission, treatments were needed:1} Final Clinical Impression(s) / ED Diagnoses Final diagnoses:  None    Rx / DC Orders ED Discharge Orders     None

## 2022-07-10 NOTE — ED Provider Notes (Signed)
7:04 AM Patient signed out to me by previous ED physician. Pt is a 51yo female with pmh of gastroparesis presenting for nausea/vomiting.  Treatment: IV 1 L and reglan  Pending: re-eval and PO challenege   Physical Exam  BP (!) 162/95   Pulse 100   Temp 98.7 F (37.1 C) (Oral)   Resp (!) 30   Ht 6' (1.829 m)   Wt 81.6 kg   LMP 11/17/2021 (Approximate)   SpO2 94%   BMI 24.41 kg/m   Physical Exam Cardiovascular:     Rate and Rhythm: Normal rate.  Pulmonary:     Effort: Pulmonary effort is normal.  Abdominal:     Palpations: Abdomen is soft.     Tenderness: There is no abdominal tenderness.     Procedures  Procedures  ED Course / MDM    Medical Decision Making Amount and/or Complexity of Data Reviewed Labs: ordered.  Risk Prescription drug management.   On re-evaluation patient admits to improvement of vomiting. Admits to some but minimal nausea at this time. Able to tolerate water and crackers. States she feels safe being discharged at this time and will f/u with outpatient GI. Has prescriptions at home. Labs reviewed and stable.   Symptoms likely due to gastroparesis.   Patient in no distress and overall condition improved here in the ED. Detailed discussions were had with the patient regarding current findings, and need for close f/u with PCP or on call doctor. The patient has been instructed to return immediately if the symptoms worsen in any way for re-evaluation. Patient verbalized understanding and is in agreement with current care plan. All questions answered prior to discharge.         Campbell Stall P, DO 81/85/63 [phone]

## 2022-07-10 NOTE — ED Triage Notes (Signed)
Pt states that she has been vomiting since Monday. Pt states that she been seen multiple times for same since August.

## 2022-07-10 NOTE — ED Notes (Signed)
Pt IV removed. Pt states she will go to [facility].

## 2022-07-10 NOTE — ED Triage Notes (Signed)
Pt arrives from home, with Hx of gastroparesis, nausea, vomiting, abdominal pain since Monday. stopped her insulin pump around 1600 yesterday. Light bilious color with some mucous. Decreased oral intake. took Reglan, phenergan hydroxizine. Vitals en route, 110, 192/118, cbg 248, lungs clear. IV established in the right AC.

## 2022-07-10 NOTE — ED Notes (Signed)
Discharge paperwork given and verbally understood. 

## 2022-07-10 NOTE — ED Notes (Signed)
Multiple Rns including this one have attempted to gain IV access but were unsuccessful. MD made aware and is attempting to gain IV access at this time.

## 2022-07-10 NOTE — ED Triage Notes (Signed)
Patient reporting that she has had some upper abdominal pain and vomiting for about 2 days. She has used her phenergan suppository without relief tonight. Hx of gastroparesis. Denies fevers. Pt with dry heaves in triage.

## 2022-07-12 ENCOUNTER — Encounter (HOSPITAL_BASED_OUTPATIENT_CLINIC_OR_DEPARTMENT_OTHER): Payer: Self-pay | Admitting: Emergency Medicine

## 2022-07-12 ENCOUNTER — Encounter (HOSPITAL_COMMUNITY): Payer: Self-pay

## 2022-07-12 ENCOUNTER — Other Ambulatory Visit: Payer: Self-pay

## 2022-07-12 ENCOUNTER — Emergency Department (HOSPITAL_BASED_OUTPATIENT_CLINIC_OR_DEPARTMENT_OTHER): Payer: BC Managed Care – PPO | Admitting: Radiology

## 2022-07-12 ENCOUNTER — Inpatient Hospital Stay (HOSPITAL_BASED_OUTPATIENT_CLINIC_OR_DEPARTMENT_OTHER)
Admission: EM | Admit: 2022-07-12 | Discharge: 2022-07-17 | DRG: 291 | Disposition: A | Discharge status: Home Health | Payer: BC Managed Care – PPO | Attending: Internal Medicine | Admitting: Internal Medicine

## 2022-07-12 ENCOUNTER — Emergency Department (HOSPITAL_BASED_OUTPATIENT_CLINIC_OR_DEPARTMENT_OTHER): Payer: BC Managed Care – PPO

## 2022-07-12 ENCOUNTER — Inpatient Hospital Stay (HOSPITAL_COMMUNITY): Payer: BC Managed Care – PPO

## 2022-07-12 DIAGNOSIS — Z800 Family history of malignant neoplasm of digestive organs: Secondary | ICD-10-CM | POA: Diagnosis not present

## 2022-07-12 DIAGNOSIS — E10649 Type 1 diabetes mellitus with hypoglycemia without coma: Secondary | ICD-10-CM | POA: Diagnosis not present

## 2022-07-12 DIAGNOSIS — E1040 Type 1 diabetes mellitus with diabetic neuropathy, unspecified: Secondary | ICD-10-CM | POA: Diagnosis present

## 2022-07-12 DIAGNOSIS — D631 Anemia in chronic kidney disease: Secondary | ICD-10-CM | POA: Diagnosis not present

## 2022-07-12 DIAGNOSIS — E1022 Type 1 diabetes mellitus with diabetic chronic kidney disease: Secondary | ICD-10-CM | POA: Diagnosis not present

## 2022-07-12 DIAGNOSIS — Z823 Family history of stroke: Secondary | ICD-10-CM

## 2022-07-12 DIAGNOSIS — N179 Acute kidney failure, unspecified: Secondary | ICD-10-CM | POA: Diagnosis present

## 2022-07-12 DIAGNOSIS — Z6824 Body mass index (BMI) 24.0-24.9, adult: Secondary | ICD-10-CM

## 2022-07-12 DIAGNOSIS — Z833 Family history of diabetes mellitus: Secondary | ICD-10-CM

## 2022-07-12 DIAGNOSIS — Z8041 Family history of malignant neoplasm of ovary: Secondary | ICD-10-CM | POA: Diagnosis not present

## 2022-07-12 DIAGNOSIS — Z801 Family history of malignant neoplasm of trachea, bronchus and lung: Secondary | ICD-10-CM

## 2022-07-12 DIAGNOSIS — Z9641 Presence of insulin pump (external) (internal): Secondary | ICD-10-CM | POA: Diagnosis not present

## 2022-07-12 DIAGNOSIS — I5033 Acute on chronic diastolic (congestive) heart failure: Secondary | ICD-10-CM | POA: Diagnosis present

## 2022-07-12 DIAGNOSIS — R0602 Shortness of breath: Secondary | ICD-10-CM | POA: Diagnosis not present

## 2022-07-12 DIAGNOSIS — R079 Chest pain, unspecified: Secondary | ICD-10-CM | POA: Diagnosis present

## 2022-07-12 DIAGNOSIS — D649 Anemia, unspecified: Secondary | ICD-10-CM

## 2022-07-12 DIAGNOSIS — K3184 Gastroparesis: Secondary | ICD-10-CM | POA: Diagnosis not present

## 2022-07-12 DIAGNOSIS — N184 Chronic kidney disease, stage 4 (severe): Secondary | ICD-10-CM | POA: Diagnosis present

## 2022-07-12 DIAGNOSIS — T501X6A Underdosing of loop [high-ceiling] diuretics, initial encounter: Secondary | ICD-10-CM | POA: Diagnosis not present

## 2022-07-12 DIAGNOSIS — E1043 Type 1 diabetes mellitus with diabetic autonomic (poly)neuropathy: Secondary | ICD-10-CM | POA: Diagnosis present

## 2022-07-12 DIAGNOSIS — I130 Hypertensive heart and chronic kidney disease with heart failure and stage 1 through stage 4 chronic kidney disease, or unspecified chronic kidney disease: Principal | ICD-10-CM | POA: Diagnosis present

## 2022-07-12 DIAGNOSIS — K2970 Gastritis, unspecified, without bleeding: Secondary | ICD-10-CM | POA: Diagnosis present

## 2022-07-12 DIAGNOSIS — I160 Hypertensive urgency: Secondary | ICD-10-CM | POA: Diagnosis not present

## 2022-07-12 DIAGNOSIS — J90 Pleural effusion, not elsewhere classified: Secondary | ICD-10-CM | POA: Diagnosis not present

## 2022-07-12 DIAGNOSIS — I509 Heart failure, unspecified: Secondary | ICD-10-CM

## 2022-07-12 DIAGNOSIS — E440 Moderate protein-calorie malnutrition: Secondary | ICD-10-CM | POA: Diagnosis present

## 2022-07-12 DIAGNOSIS — Z8042 Family history of malignant neoplasm of prostate: Secondary | ICD-10-CM

## 2022-07-12 DIAGNOSIS — Z8349 Family history of other endocrine, nutritional and metabolic diseases: Secondary | ICD-10-CM

## 2022-07-12 DIAGNOSIS — Z803 Family history of malignant neoplasm of breast: Secondary | ICD-10-CM | POA: Diagnosis not present

## 2022-07-12 DIAGNOSIS — Z8249 Family history of ischemic heart disease and other diseases of the circulatory system: Secondary | ICD-10-CM

## 2022-07-12 DIAGNOSIS — Z818 Family history of other mental and behavioral disorders: Secondary | ICD-10-CM

## 2022-07-12 DIAGNOSIS — E1143 Type 2 diabetes mellitus with diabetic autonomic (poly)neuropathy: Secondary | ICD-10-CM

## 2022-07-12 DIAGNOSIS — Z95810 Presence of automatic (implantable) cardiac defibrillator: Secondary | ICD-10-CM

## 2022-07-12 DIAGNOSIS — Z888 Allergy status to other drugs, medicaments and biological substances status: Secondary | ICD-10-CM

## 2022-07-12 DIAGNOSIS — F419 Anxiety disorder, unspecified: Secondary | ICD-10-CM | POA: Diagnosis present

## 2022-07-12 DIAGNOSIS — J45909 Unspecified asthma, uncomplicated: Secondary | ICD-10-CM | POA: Diagnosis present

## 2022-07-12 DIAGNOSIS — Z79899 Other long term (current) drug therapy: Secondary | ICD-10-CM

## 2022-07-12 DIAGNOSIS — J811 Chronic pulmonary edema: Secondary | ICD-10-CM | POA: Diagnosis not present

## 2022-07-12 DIAGNOSIS — Z91148 Patient's other noncompliance with medication regimen for other reason: Secondary | ICD-10-CM

## 2022-07-12 DIAGNOSIS — Z8674 Personal history of sudden cardiac arrest: Secondary | ICD-10-CM

## 2022-07-12 DIAGNOSIS — Z1152 Encounter for screening for COVID-19: Secondary | ICD-10-CM

## 2022-07-12 DIAGNOSIS — F32A Depression, unspecified: Secondary | ICD-10-CM | POA: Diagnosis present

## 2022-07-12 DIAGNOSIS — R7989 Other specified abnormal findings of blood chemistry: Secondary | ICD-10-CM | POA: Diagnosis not present

## 2022-07-12 DIAGNOSIS — G43909 Migraine, unspecified, not intractable, without status migrainosus: Secondary | ICD-10-CM | POA: Diagnosis present

## 2022-07-12 DIAGNOSIS — J9601 Acute respiratory failure with hypoxia: Secondary | ICD-10-CM | POA: Diagnosis not present

## 2022-07-12 DIAGNOSIS — R059 Cough, unspecified: Secondary | ICD-10-CM | POA: Diagnosis not present

## 2022-07-12 DIAGNOSIS — Z881 Allergy status to other antibiotic agents status: Secondary | ICD-10-CM

## 2022-07-12 LAB — CBC
HCT: 24.9 % — ABNORMAL LOW (ref 36.0–46.0)
Hemoglobin: 8.2 g/dL — ABNORMAL LOW (ref 12.0–15.0)
MCH: 28.9 pg (ref 26.0–34.0)
MCHC: 32.9 g/dL (ref 30.0–36.0)
MCV: 87.7 fL (ref 80.0–100.0)
Platelets: 245 10*3/uL (ref 150–400)
RBC: 2.84 MIL/uL — ABNORMAL LOW (ref 3.87–5.11)
RDW: 15.9 % — ABNORMAL HIGH (ref 11.5–15.5)
WBC: 9.2 10*3/uL (ref 4.0–10.5)
nRBC: 0 % (ref 0.0–0.2)

## 2022-07-12 LAB — CBC WITH DIFFERENTIAL/PLATELET
Abs Immature Granulocytes: 0.03 10*3/uL (ref 0.00–0.07)
Basophils Absolute: 0 10*3/uL (ref 0.0–0.1)
Basophils Relative: 0 %
Eosinophils Absolute: 0 10*3/uL (ref 0.0–0.5)
Eosinophils Relative: 1 %
HCT: 26.7 % — ABNORMAL LOW (ref 36.0–46.0)
Hemoglobin: 8.7 g/dL — ABNORMAL LOW (ref 12.0–15.0)
Immature Granulocytes: 0 %
Lymphocytes Relative: 15 %
Lymphs Abs: 1.1 10*3/uL (ref 0.7–4.0)
MCH: 28.2 pg (ref 26.0–34.0)
MCHC: 32.6 g/dL (ref 30.0–36.0)
MCV: 86.4 fL (ref 80.0–100.0)
Monocytes Absolute: 0.2 10*3/uL (ref 0.1–1.0)
Monocytes Relative: 2 %
Neutro Abs: 5.6 10*3/uL (ref 1.7–7.7)
Neutrophils Relative %: 82 %
Platelets: 245 10*3/uL (ref 150–400)
RBC: 3.09 MIL/uL — ABNORMAL LOW (ref 3.87–5.11)
RDW: 16.2 % — ABNORMAL HIGH (ref 11.5–15.5)
WBC: 6.9 10*3/uL (ref 4.0–10.5)
nRBC: 0 % (ref 0.0–0.2)

## 2022-07-12 LAB — I-STAT VENOUS BLOOD GAS, ED
Acid-base deficit: 3 mmol/L — ABNORMAL HIGH (ref 0.0–2.0)
Bicarbonate: 21.5 mmol/L (ref 20.0–28.0)
Calcium, Ion: 1.17 mmol/L (ref 1.15–1.40)
HCT: 26 % — ABNORMAL LOW (ref 36.0–46.0)
Hemoglobin: 8.8 g/dL — ABNORMAL LOW (ref 12.0–15.0)
O2 Saturation: 54 %
Patient temperature: 98.3
Potassium: 3.8 mmol/L (ref 3.5–5.1)
Sodium: 142 mmol/L (ref 135–145)
TCO2: 23 mmol/L (ref 22–32)
pCO2, Ven: 34.4 mmHg — ABNORMAL LOW (ref 44–60)
pH, Ven: 7.404 (ref 7.25–7.43)
pO2, Ven: 28 mmHg — CL (ref 32–45)

## 2022-07-12 LAB — RESP PANEL BY RT-PCR (FLU A&B, COVID) ARPGX2
Influenza A by PCR: NEGATIVE
Influenza B by PCR: NEGATIVE
SARS Coronavirus 2 by RT PCR: NEGATIVE

## 2022-07-12 LAB — TROPONIN I (HIGH SENSITIVITY)
Troponin I (High Sensitivity): 110 ng/L (ref <18)
Troponin I (High Sensitivity): 95 ng/L — ABNORMAL HIGH (ref <18)
Troponin I (High Sensitivity): 83 ng/L — ABNORMAL HIGH (ref <18)

## 2022-07-12 LAB — D-DIMER, QUANTITATIVE: D-Dimer, Quant: 7.03 ug/mL-FEU — ABNORMAL HIGH (ref 0.00–0.50)

## 2022-07-12 LAB — COMPREHENSIVE METABOLIC PANEL
ALT: 13 U/L (ref 0–44)
AST: 16 U/L (ref 15–41)
Albumin: 3.3 g/dL — ABNORMAL LOW (ref 3.5–5.0)
Alkaline Phosphatase: 89 U/L (ref 38–126)
Anion gap: 12 (ref 5–15)
BUN: 23 mg/dL — ABNORMAL HIGH (ref 6–20)
CO2: 21 mmol/L — ABNORMAL LOW (ref 22–32)
Calcium: 8.2 mg/dL — ABNORMAL LOW (ref 8.9–10.3)
Chloride: 109 mmol/L (ref 98–111)
Creatinine, Ser: 3.04 mg/dL — ABNORMAL HIGH (ref 0.44–1.00)
GFR, Estimated: 18 mL/min — ABNORMAL LOW (ref >60)
Glucose, Bld: 175 mg/dL — ABNORMAL HIGH (ref 70–99)
Potassium: 4 mmol/L (ref 3.5–5.1)
Sodium: 142 mmol/L (ref 135–145)
Total Bilirubin: 1.2 mg/dL (ref 0.3–1.2)
Total Protein: 6.3 g/dL — ABNORMAL LOW (ref 6.5–8.1)

## 2022-07-12 LAB — LIPASE, BLOOD: Lipase: <10 U/L — ABNORMAL LOW (ref 11–51)

## 2022-07-12 LAB — CREATININE, SERUM
Creatinine, Ser: 3.15 mg/dL — ABNORMAL HIGH (ref 0.44–1.00)
GFR, Estimated: 17 mL/min — ABNORMAL LOW (ref >60)

## 2022-07-12 LAB — CBG MONITORING, ED: Glucose-Capillary: 158 mg/dL — ABNORMAL HIGH (ref 70–99)

## 2022-07-12 LAB — GLUCOSE, CAPILLARY
Glucose-Capillary: 240 mg/dL — ABNORMAL HIGH (ref 70–99)
Glucose-Capillary: 176 mg/dL — ABNORMAL HIGH (ref 70–99)

## 2022-07-12 LAB — BRAIN NATRIURETIC PEPTIDE: B Natriuretic Peptide: 1035.3 pg/mL — ABNORMAL HIGH (ref 0.0–100.0)

## 2022-07-12 MED ORDER — INSULIN ASPART 100 UNIT/ML IJ SOLN
0.0000 [IU] | Freq: Every day | INTRAMUSCULAR | Status: DC
  Administered 2022-07-16: 2 [IU] via SUBCUTANEOUS

## 2022-07-12 MED ORDER — SODIUM CHLORIDE 0.9% FLUSH
3.0000 mL | Freq: Two times a day (BID) | INTRAVENOUS | Status: DC
  Administered 2022-07-12 – 2022-07-17 (×10): 3 mL via INTRAVENOUS

## 2022-07-12 MED ORDER — GUAIFENESIN-CODEINE 100-10 MG/5ML PO SOLN
10.0000 mL | Freq: Once | ORAL | Status: DC
  Filled 2022-07-12: qty 10

## 2022-07-12 MED ORDER — SODIUM CHLORIDE 0.9% FLUSH: 3.0000 mL | INTRAVENOUS | Status: DC | PRN

## 2022-07-12 MED ORDER — METOPROLOL TARTRATE 5 MG/5ML IV SOLN
2.5000 mg | Freq: Four times a day (QID) | INTRAVENOUS | Status: DC
  Administered 2022-07-12 – 2022-07-13 (×4): 2.5 mg via INTRAVENOUS
  Filled 2022-07-12 (×4): qty 5

## 2022-07-12 MED ORDER — MORPHINE SULFATE (PF) 2 MG/ML IV SOLN
1.0000 mg | INTRAVENOUS | Status: DC | PRN
  Administered 2022-07-12 – 2022-07-13 (×3): 1 mg via INTRAVENOUS
  Filled 2022-07-12 (×3): qty 1

## 2022-07-12 MED ORDER — ACETAMINOPHEN 325 MG PO TABS
650.0000 mg | ORAL_TABLET | Freq: Four times a day (QID) | ORAL | Status: DC | PRN
  Administered 2022-07-16 (×2): 650 mg via ORAL
  Filled 2022-07-12 (×2): qty 2

## 2022-07-12 MED ORDER — DIPHENHYDRAMINE HCL 50 MG/ML IJ SOLN
25.0000 mg | Freq: Once | INTRAMUSCULAR | Status: AC
  Administered 2022-07-12: 25 mg via INTRAVENOUS
  Filled 2022-07-12: qty 1

## 2022-07-12 MED ORDER — SODIUM CHLORIDE 0.9 % IV SOLN
25.0000 mg | Freq: Once | INTRAVENOUS | Status: DC
  Filled 2022-07-12: qty 1

## 2022-07-12 MED ORDER — INSULIN ASPART 100 UNIT/ML IJ SOLN
0.0000 [IU] | Freq: Three times a day (TID) | INTRAMUSCULAR | Status: DC
  Administered 2022-07-12: 5 [IU] via SUBCUTANEOUS
  Administered 2022-07-13 (×2): 3 [IU] via SUBCUTANEOUS

## 2022-07-12 MED ORDER — HEPARIN SODIUM (PORCINE) 5000 UNIT/ML IJ SOLN
5000.0000 [IU] | Freq: Three times a day (TID) | INTRAMUSCULAR | Status: DC
  Administered 2022-07-12 – 2022-07-17 (×15): 5000 [IU] via SUBCUTANEOUS
  Filled 2022-07-12 (×15): qty 1

## 2022-07-12 MED ORDER — FUROSEMIDE 10 MG/ML IJ SOLN
60.0000 mg | Freq: Once | INTRAMUSCULAR | Status: AC
  Administered 2022-07-12: 60 mg via INTRAVENOUS
  Filled 2022-07-12: qty 6

## 2022-07-12 MED ORDER — ACETAMINOPHEN 650 MG RE SUPP: 650.0000 mg | Freq: Four times a day (QID) | RECTAL | Status: DC | PRN

## 2022-07-12 MED ORDER — PANTOPRAZOLE SODIUM 40 MG IV SOLR
40.0000 mg | Freq: Once | INTRAVENOUS | Status: AC
  Administered 2022-07-12: 40 mg via INTRAVENOUS
  Filled 2022-07-12: qty 10

## 2022-07-12 MED ORDER — PROMETHAZINE HCL 25 MG/ML IJ SOLN: 25.0000 mg | Freq: Once | INTRAMUSCULAR | Status: AC

## 2022-07-12 MED ORDER — PHENOL 1.4 % MT LIQD: 1.0000 | OROMUCOSAL | Status: DC | PRN

## 2022-07-12 MED ORDER — PROMETHAZINE HCL 25 MG/ML IJ SOLN
INTRAMUSCULAR | Status: AC
  Administered 2022-07-12: 25 mg via INTRAMUSCULAR
  Filled 2022-07-12: qty 1

## 2022-07-12 MED ORDER — METOCLOPRAMIDE HCL 5 MG/ML IJ SOLN
10.0000 mg | Freq: Four times a day (QID) | INTRAMUSCULAR | Status: DC
  Administered 2022-07-12 – 2022-07-13 (×4): 10 mg via INTRAVENOUS
  Filled 2022-07-12 (×4): qty 2

## 2022-07-12 MED ORDER — ALUM & MAG HYDROXIDE-SIMETH 200-200-20 MG/5ML PO SUSP: 30.0000 mL | Freq: Once | ORAL | Status: DC

## 2022-07-12 MED ORDER — TECHNETIUM TO 99M ALBUMIN AGGREGATED
4.0000 | Freq: Once | INTRAVENOUS | Status: AC | PRN
  Administered 2022-07-12: 4 via INTRAVENOUS

## 2022-07-12 MED ORDER — ACETAMINOPHEN-CODEINE 300-30 MG PO TABS: 2.0000 | ORAL_TABLET | Freq: Once | ORAL | Status: DC

## 2022-07-12 MED ORDER — DROPERIDOL 2.5 MG/ML IJ SOLN
1.2500 mg | Freq: Once | INTRAMUSCULAR | Status: AC
  Administered 2022-07-12: 1.25 mg via INTRAVENOUS
  Filled 2022-07-12: qty 2

## 2022-07-12 MED ORDER — PNEUMOCOCCAL 20-VAL CONJ VACC 0.5 ML IM SUSY
0.5000 mL | PREFILLED_SYRINGE | INTRAMUSCULAR | Status: DC
  Filled 2022-07-12: qty 0.5

## 2022-07-12 MED ORDER — LORAZEPAM 0.5 MG PO TABS
0.5000 mg | ORAL_TABLET | Freq: Once | ORAL | Status: AC
  Administered 2022-07-12: 0.5 mg via ORAL
  Filled 2022-07-12: qty 1

## 2022-07-12 MED ORDER — SODIUM CHLORIDE 0.9 % IV SOLN
250.0000 mL | INTRAVENOUS | Status: DC | PRN
  Administered 2022-07-13: 250 mL via INTRAVENOUS

## 2022-07-12 MED ORDER — PROMETHAZINE HCL 25 MG/ML IJ SOLN
INTRAMUSCULAR | Status: AC
  Filled 2022-07-12: qty 1

## 2022-07-12 MED ORDER — FENTANYL CITRATE PF 50 MCG/ML IJ SOSY
100.0000 ug | PREFILLED_SYRINGE | Freq: Once | INTRAMUSCULAR | Status: AC
  Administered 2022-07-12: 100 ug via INTRAVENOUS
  Filled 2022-07-12 (×2): qty 2

## 2022-07-12 MED ORDER — LACTATED RINGERS IV BOLUS
1000.0000 mL | Freq: Once | INTRAVENOUS | Status: AC
  Administered 2022-07-12: 1000 mL via INTRAVENOUS

## 2022-07-12 MED ORDER — HYDRALAZINE HCL 20 MG/ML IJ SOLN
10.0000 mg | Freq: Four times a day (QID) | INTRAMUSCULAR | Status: DC | PRN
  Filled 2022-07-12 (×4): qty 1

## 2022-07-12 MED ORDER — SODIUM CHLORIDE 0.9 % IV SOLN
12.5000 mg | Freq: Four times a day (QID) | INTRAVENOUS | Status: DC | PRN
  Administered 2022-07-12 – 2022-07-16 (×3): 12.5 mg via INTRAVENOUS
  Filled 2022-07-12 (×5): qty 0.5

## 2022-07-12 NOTE — ED Notes (Signed)
Report received from [facility].

## 2022-07-12 NOTE — ED Notes (Signed)
Patient transported to Ultrasound 

## 2022-07-12 NOTE — ED Notes (Signed)
Report given to [facility]... Agricultural consultant gave report to the [employer].

## 2022-07-12 NOTE — ED Notes (Signed)
Phenergan pulled and mixed with NS, MD changed order to IM after difficulty establishing IV.

## 2022-07-12 NOTE — Progress Notes (Signed)
Pt arrived to unit from ED via [facility] at 1635. Pt A&O x4, IV x1, on 4L [facility]. VSS. CHG bath performed. Pt nauseous; MD [place] notified. Call bell given to pt. Spouse at bedside.

## 2022-07-12 NOTE — H&P (Signed)
TRH H&P    Patient Demographics:    [name], is a 51 y.o. female  MRN: [id number]  DOB - [birth year]  Admit Date - 07/12/2022  Referring MD/NP/PA: Dr. Kathrynn Humble  Outpatient Primary MD for the patient is [name], [place]  Patient coming from: [place]  Chief complaint-chest pain   HPI:    [name]  is a 51 y.o. female, with history of diabetes mellitus type 2, diabetic gastroparesis, diastolic HFpEF , ICD placement because of cardiac arrest requiring CPR, presented with complaints of cough, chest pain, nausea and vomiting.  Patient also complains of shortness of breath.  Patient has insulin pump and has not used insulin since this morning since her sugar was dropping.  Patient states that she started coughing yesterday which was associated with epigastric pain, also started vomiting.  Pain is located in the middle of chest, worse with inspiration.  In the ED she was found to have significantly elevated BNP of 1000, troponin 110, 95.  D-dimer was elevated at 7.03.  Patient's creatinine was found to be elevated at 3.04.  CT chest could not be obtained due to elevated creatinine.  Patient was transferred to [facility] for VQ scan.  Repeat chest x-ray showed pulmonary vascular congestion and patient was given Lasix 60 mg IV in the ED.  She still complains of chest pain She is now requiring 6 L/min of oxygen  Having nausea and vomiting Denies dysuria Denies abdominal pain Patient states that she has not taken her torsemide; which is ordered as as needed. she did not feel the need for it.    Review of systems:    In addition to the HPI above,    All other systems reviewed and are negative.    Past History of the following :    Past Medical History:  Diagnosis Date   Anemia    Anxiety    Arthritis    Asthma    CHF (congestive heart failure) ([place])    Depression    Diabetic  ulcer of left foot ([place]) 05/12/2013   Gastroparesis    Generalized abdominal pain    History of chicken pox    Loss of weight 12/02/2019   Migraines    Mood disorder (HCC)    anxiety   Prurigo nodularis    with diabetic dermopathy   Type 1 diabetes, uncontrolled, with neuropathy    [place]   Ulcers of both lower legs ([place]) 02/20/2014      Past Surgical History:  Procedure Laterality Date   BIOPSY  08/11/2020   Procedure: BIOPSY;  Surgeon: Ladene Artist, MD;  Location: [facility];  Service: Endoscopy;;   BIOPSY  04/02/2022   Procedure: BIOPSY;  Surgeon: Jackquline Denmark, MD;  Location: [facility];  Service: Gastroenterology;;   ESOPHAGOGASTRODUODENOSCOPY (EGD) WITH PROPOFOL N/A 08/11/2020   Procedure: ESOPHAGOGASTRODUODENOSCOPY (EGD) WITH PROPOFOL;  Surgeon: Ladene Artist, MD;  Location: [facility];  Service: Endoscopy;  Laterality: N/A;   ESOPHAGOGASTRODUODENOSCOPY (EGD) WITH PROPOFOL N/A 04/02/2022   Procedure: ESOPHAGOGASTRODUODENOSCOPY (EGD)  WITH PROPOFOL;  Surgeon: Jackquline Denmark, MD;  Location: [facility];  Service: Gastroenterology;  Laterality: N/A;   ESOPHAGOGASTRODUODENOSCOPY (EGD) WITH PROPOFOL N/A 05/23/2022   Procedure: ESOPHAGOGASTRODUODENOSCOPY (EGD) WITH PROPOFOL;  Surgeon: Lucilla Lame, MD;  Location: [facility] ENDOSCOPY;  Service: Endoscopy;  Laterality: N/A;   ICD IMPLANT N/A 04/09/2022   Procedure: ICD IMPLANT;  Surgeon: Constance Haw, MD;  Location: [facility];  Service: Cardiovascular;  Laterality: N/A;   treadmill stress test  01/2013   WNL, low risk study      Social History:      Social History   Tobacco Use   Smoking status: Never   Smokeless tobacco: Never  Substance Use Topics   Alcohol use: Not Currently       Family History :     Family History  Problem Relation Age of Onset   Diabetes Mother        type 2   Hypertension Mother    Thyroid disease Mother    Bipolar disorder Mother    Heart disease Mother    Calcium  disorder Mother    Cancer Maternal Grandmother        Breast, stomach   Cancer Paternal Grandmother        stomach, lung (smoker)   Diabetes Paternal Grandmother    Diabetes Paternal Grandfather    Heart failure Sister    Diabetes Sister    Stroke Maternal Aunt    Cancer Maternal Uncle        prostate   CAD Maternal Aunt        stents   Cancer Maternal Aunt 64       ovarian      Home Medications:   Prior to Admission medications   Medication Sig Start Date End Date Taking? Authorizing Provider  acetaminophen (TYLENOL) 500 MG tablet Take 1,000 mg by mouth 3 (three) times daily as needed for moderate pain.   Yes [provider]  albuterol (PROAIR HFA) 108 (90 Base) MCG/ACT inhaler Inhale 2 puffs into the lungs every 6 (six) hours as needed for wheezing or shortness of breath. 07/03/20  Yes [name], PA-C  Carboxymethylcellul-Glycerin (LUBRICATING EYE DROPS OP) Place 1-2 drops into both eyes daily as needed (dry eyes).   Yes [provider]  Continuous Blood Gluc Sensor (FREESTYLE LIBRE 2 SENSOR) MISC Inject 1 Device into the skin every 14 (fourteen) days. 03/20/20  Yes [provider]  hydrOXYzine (ATARAX) 50 MG tablet Take 50 mg by mouth in the morning, at noon, and at bedtime.   Yes [provider]  LORazepam (ATIVAN) 0.5 MG tablet Take 1 tablet (0.5 mg total) by mouth every 8 (eight) hours as needed for anxiety (Refractory nausea and vomiting). 05/24/22  Yes Rai, Ripudeep K, MD  metoprolol succinate (TOPROL XL) 25 MG 24 hr tablet Take 1 tablet (25 mg total) by mouth daily. 04/19/22  Yes [name], PA-C  pantoprazole (PROTONIX) 40 MG tablet Take 1 tablet (40 mg total) by mouth 2 (two) times daily before a meal. 05/24/22 07/23/22 Yes Rai, Ripudeep K, MD  pregabalin (LYRICA) 50 MG capsule Take 50 mg by mouth 2 (two) times daily as needed (neuropathy). 12/05/21  Yes [provider]  prochlorperazine (COMPAZINE) 10 MG tablet  Take 1 tablet (10 mg total) by mouth every 6 (six) hours as needed for nausea or vomiting. 05/24/22  Yes Rai, Ripudeep K, MD  promethazine (PHENERGAN) 25 MG suppository Place 1 suppository (25 mg  total) rectally every 6 (six) hours as needed for nausea or vomiting. 05/24/22  Yes Rai, Ripudeep K, MD  rosuvastatin (CRESTOR) 20 MG tablet Take 1 tablet (20 mg total) by mouth daily. 05/24/22 09/21/22 Yes Rai, Ripudeep K, MD  scopolamine (TRANSDERM-SCOP) 1 MG/3DAYS Place 1 patch (1.5 mg total) onto the skin every 3 (three) days. 05/26/22  Yes Rai, Ripudeep K, MD  tobramycin (TOBREX) 0.3 % ophthalmic solution Place 1-2 drops into both eyes See admin instructions. Instill 1-2 drops into both eyes 3 times daily the day before, day of, and the day after every 6 weeks eye injections per patient 11/17/20  Yes [provider]  torsemide (DEMADEX) 20 MG tablet Take 20 mg by mouth daily as needed (swelling). 12/25/21  Yes [provider]  Ensure Max Protein (ENSURE MAX PROTEIN) LIQD Take 330 mLs (11 oz total) by mouth daily. Patient not taking: Reported on 07/12/2022 04/30/22   Modena Jansky, MD  metoCLOPramide (REGLAN) 10 MG tablet Take 1 tablet (10 mg total) by mouth 3 (three) times daily before meals. Patient not taking: Reported on 07/12/2022 05/24/22   Mendel Corning, MD  Multiple Vitamin (MULTIVITAMIN WITH MINERALS) TABS tablet Take 1 tablet by mouth daily. Patient not taking: Reported on 07/12/2022 04/30/22   Modena Jansky, MD  polyethylene glycol (MIRALAX / GLYCOLAX) 17 g packet Take 17 g by mouth daily as needed for moderate constipation. Patient not taking: Reported on 07/12/2022    [provider]     Allergies:     Allergies  Allergen Reactions   Atorvastatin Other (See Comments)    Dizziness    Dulaglutide Nausea And Vomiting and Other (See Comments)    Caused pancreatitis    Zofran [Ondansetron] Nausea And Vomiting and Other (See Comments)    Causes nausea vomiting  to worsen / doesn't really work for patient.   Amoxicillin Itching and Rash   Lantus [Insulin Glargine] Itching and Rash   Miconazole Nitrate Nausea And Vomiting and Rash     Physical Exam:   Vitals  Blood pressure (!) 183/109, pulse 99, temperature 98.2 F (36.8 C), temperature source Oral, resp. rate 20, last menstrual period 11/17/2021, SpO2 97 %.  1.  General: Appears lethargic  2. Psychiatric: Alert, oriented x 3, intact insight and judgment  3. Neurologic: Cranial nerves II through XII grossly intact, no focal deficit noted, moving all extremities  4. HEENMT:  Atraumatic normocephalic, extraocular muscles are intact  5. Respiratory : Clear to auscultation bilaterally  6. Cardiovascular : S1-S2, regular, no murmur auscultated, positive chest wall tenderness, no edema in the lower extremities  7. Gastrointestinal:  Soft, positive epigastric tenderness     Data Review:    CBC Recent Labs  Lab 07/10/22 0345 07/10/22 0615 07/12/22 0535 07/12/22 0551  WBC 7.3 7.4 6.9  --   HGB 8.3* 8.7* 8.7* 8.8*  HCT 26.1* 26.8* 26.7* 26.0*  PLT 262 288 245  --   MCV 88.5 86.2 86.4  --   MCH 28.1 28.0 28.2  --   MCHC 31.8 32.5 32.6  --   RDW 15.9* 16.0* 16.2*  --   LYMPHSABS  --  0.6* 1.1  --   MONOABS  --  0.1 0.2  --   EOSABS  --  0.0 0.0  --   BASOSABS  --  0.0 0.0  --    ------------------------------------------------------------------------------------------------------------------  Results for orders placed or performed during the hospital encounter of 07/12/22 (from the past 48  hour(s))  CBG monitoring, ED     Status: Abnormal   Collection Time: 07/12/22  5:23 AM  Result Value Ref Range   Glucose-Capillary 158 (H) 70 - 99 mg/dL    Comment: Glucose reference range applies only to samples taken after fasting for at least 8 hours.  CBC with Differential     Status: Abnormal   Collection Time: 07/12/22  5:35 AM  Result Value Ref Range   WBC 6.9 4.0 - 10.5 K/uL    RBC 3.09 (L) 3.87 - 5.11 MIL/uL   Hemoglobin 8.7 (L) 12.0 - 15.0 g/dL   HCT 26.7 (L) 36.0 - 46.0 %   MCV 86.4 80.0 - 100.0 fL   MCH 28.2 26.0 - 34.0 pg   MCHC 32.6 30.0 - 36.0 g/dL   RDW 16.2 (H) 11.5 - 15.5 %   Platelets 245 150 - 400 K/uL    Comment: SPECIMEN CHECKED FOR CLOTS PLATELET COUNT CONFIRMED BY SMEAR    nRBC 0.0 0.0 - 0.2 %   Neutrophils Relative % 82 %   Neutro Abs 5.6 1.7 - 7.7 K/uL   Lymphocytes Relative 15 %   Lymphs Abs 1.1 0.7 - 4.0 K/uL   Monocytes Relative 2 %   Monocytes Absolute 0.2 0.1 - 1.0 K/uL   Eosinophils Relative 1 %   Eosinophils Absolute 0.0 0.0 - 0.5 K/uL   Basophils Relative 0 %   Basophils Absolute 0.0 0.0 - 0.1 K/uL   Immature Granulocytes 0 %   Abs Immature Granulocytes 0.03 0.00 - 0.07 K/uL    Comment: Performed at [facility], [place], [place] [address]  Comprehensive metabolic panel     Status: Abnormal   Collection Time: 07/12/22  5:35 AM  Result Value Ref Range   Sodium 142 135 - 145 mmol/L   Potassium 4.0 3.5 - 5.1 mmol/L   Chloride 109 98 - 111 mmol/L   CO2 21 (L) 22 - 32 mmol/L   Glucose, Bld 175 (H) 70 - 99 mg/dL    Comment: Glucose reference range applies only to samples taken after fasting for at least 8 hours.   BUN 23 (H) 6 - 20 mg/dL   Creatinine, Ser 3.04 (H) 0.44 - 1.00 mg/dL   Calcium 8.2 (L) 8.9 - 10.3 mg/dL   Total Protein 6.3 (L) 6.5 - 8.1 g/dL   Albumin 3.3 (L) 3.5 - 5.0 g/dL   AST 16 15 - 41 U/L   ALT 13 0 - 44 U/L   Alkaline Phosphatase 89 38 - 126 U/L   Total Bilirubin 1.2 0.3 - 1.2 mg/dL   GFR, Estimated 18 (L) >60 mL/min    Comment: (NOTE) Calculated using the CKD-EPI Creatinine Equation (2021)    Anion gap 12 5 - 15    Comment: Performed at [facility], [place], [place] [address]  Lipase, blood     Status: Abnormal   Collection Time: 07/12/22  5:35 AM  Result Value Ref Range   Lipase <10 (L) 11 - 51 U/L    Comment: Performed  at [facility], [address], [place], [place] [address]  Troponin I (High Sensitivity)     Status: Abnormal   Collection Time: 07/12/22  5:35 AM  Result Value Ref Range   Troponin I (High Sensitivity) 110 (HH) <18 ng/L    Comment: CRITICAL RESULT CALLED TO, READ BACK BY AND VERIFIED WITHDiona Browner, RN [phone] [facility] 07/12/22  (NOTE) Elevated high sensitivity  troponin I (hsTnI) values and significant  changes across serial measurements may suggest ACS but many other  chronic and acute conditions are known to elevate hsTnI results.  Refer to the Links section for chest pain algorithms and additional  guidance. Performed at [facility], [address], [place], [place] [address]   D-dimer, quantitative     Status: Abnormal   Collection Time: 07/12/22  5:35 AM  Result Value Ref Range   D-Dimer, Quant 7.03 (H) 0.00 - 0.50 ug/mL-FEU    Comment: (NOTE) At the manufacturer cut-off value of 0.5 g/mL FEU, this assay has a negative predictive value of 95-100%.This assay is intended for use in conjunction with a clinical pretest probability (PTP) assessment model to exclude pulmonary embolism (PE) and deep venous thrombosis (DVT) in outpatients suspected of PE or DVT. Results should be correlated with clinical presentation. Performed at [facility], [address] [place], [place] [address]   Brain natriuretic peptide     Status: Abnormal   Collection Time: 07/12/22  5:35 AM  Result Value Ref Range   B Natriuretic Peptide 1,035.3 (H) 0.0 - 100.0 pg/mL    Comment: Performed at [facility], [address], [place], [place] [address]  Resp Panel by RT-PCR (Flu A&B, Covid) Anterior Nasal Swab     Status: None   Collection Time: 07/12/22  5:42 AM   Specimen: Anterior Nasal Swab  Result Value Ref Range   SARS Coronavirus 2 by RT PCR NEGATIVE NEGATIVE    Comment: (NOTE) SARS-CoV-2 target nucleic acids are  NOT DETECTED.  The SARS-CoV-2 RNA is generally detectable in upper respiratory specimens during the acute phase of infection. The lowest concentration of SARS-CoV-2 viral copies this assay can detect is 138 copies/mL. A negative result does not preclude SARS-Cov-2 infection and should not be used as the sole basis for treatment or other patient management decisions. A negative result may occur with  improper specimen collection/handling, submission of specimen other than nasopharyngeal swab, presence of viral mutation(s) within the areas targeted by this assay, and inadequate number of viral copies(<138 copies/mL). A negative result must be combined with clinical observations, patient history, and epidemiological information. The expected result is Negative.  Fact Sheet for Patients:  EntrepreneurPulse.com.au  Fact Sheet for Healthcare Providers:  IncredibleEmployment.be  This test is no t yet approved or cleared by the [place] FDA and  has been authorized for detection and/or diagnosis of SARS-CoV-2 by FDA under an Emergency Use Authorization (EUA). This EUA will remain  in effect (meaning this test can be used) for the duration of the COVID-19 declaration under Section 564(b)(1) of the Act, 21 U.S.C.section 360bbb-3(b)(1), unless the authorization is terminated  or revoked sooner.       Influenza A by PCR NEGATIVE NEGATIVE   Influenza B by PCR NEGATIVE NEGATIVE    Comment: (NOTE) The Xpert Xpress SARS-CoV-2/FLU/RSV plus assay is intended as an aid in the diagnosis of influenza from Nasopharyngeal swab specimens and should not be used as a sole basis for treatment. Nasal washings and aspirates are unacceptable for Xpert Xpress SARS-CoV-2/FLU/RSV testing.  Fact Sheet for Patients: EntrepreneurPulse.com.au  Fact Sheet for Healthcare Providers: IncredibleEmployment.be  This test is not yet approved  or cleared by the [place] FDA and has been authorized for detection and/or diagnosis of SARS-CoV-2 by FDA under an Emergency Use Authorization (EUA). This EUA will remain in effect (meaning this test can be used) for the duration of the COVID-19 declaration under  Section 564(b)(1) of the Act, 21 U.S.C. section 360bbb-3(b)(1), unless the authorization is terminated or revoked.  Performed at [facility], [address] [place], [place] [address]   I-Stat venous blood gas, [facility] ED only)     Status: Abnormal   Collection Time: 07/12/22  5:51 AM  Result Value Ref Range   pH, Ven 7.404 7.25 - 7.43   pCO2, Ven 34.4 (L) 44 - 60 mmHg   pO2, Ven 28 (LL) 32 - 45 mmHg   Bicarbonate 21.5 20.0 - 28.0 mmol/L   TCO2 23 22 - 32 mmol/L   O2 Saturation 54 %   Acid-base deficit 3.0 (H) 0.0 - 2.0 mmol/L   Sodium 142 135 - 145 mmol/L   Potassium 3.8 3.5 - 5.1 mmol/L   Calcium, Ion 1.17 1.15 - 1.40 mmol/L   HCT 26.0 (L) 36.0 - 46.0 %   Hemoglobin 8.8 (L) 12.0 - 15.0 g/dL   Patient temperature 98.3 F    Collection site IV start    Drawn by Nurse    Sample type VENOUS    Comment NOTIFIED PHYSICIAN   Troponin I (High Sensitivity)     Status: Abnormal   Collection Time: 07/12/22  9:00 AM  Result Value Ref Range   Troponin I (High Sensitivity) 95 (H) <18 ng/L    Comment: (NOTE) Elevated high sensitivity troponin I (hsTnI) values and significant  changes across serial measurements may suggest ACS but many other  chronic and acute conditions are known to elevate hsTnI results.  Refer to the "Links" section for chest pain algorithms and additional  guidance. Performed at [facility], [address], [place], [place] [address]     Chemistries  Recent Labs  Lab 07/10/22 0345 07/10/22 0525 07/12/22 0535 07/12/22 0551  NA 140 142 142 142  K 4.2 4.1 4.0 3.8  CL 111 108 109  --   CO2 21* 21* 21*  --   GLUCOSE 223* 258* 175*  --   BUN 26* 26* 23*   --   CREATININE 2.76* 2.75* 3.04*  --   CALCIUM 8.4* 8.6* 8.2*  --   AST 13* 11* 16  --   ALT $Re'21 17 13  'Vvm$ --   ALKPHOS 86 83 89  --   BILITOT 1.6* 1.5* 1.2  --    ------------------------------------------------------------------------------------------------------------------  ------------------------------------------------------------------------------------------------------------------ GFR: Estimated Creatinine Clearance: 25.3 mL/min (A) (by C-G formula based on SCr of 3.04 mg/dL (H)). Liver Function Tests: Recent Labs  Lab 07/10/22 0345 07/10/22 0525 07/12/22 0535  AST 13* 11* 16  ALT $Re'21 17 13  'GjQ$ ALKPHOS 86 83 89  BILITOT 1.6* 1.5* 1.2  PROT 6.1* 6.3* 6.3*  ALBUMIN 2.9* 3.3* 3.3*   Recent Labs  Lab 07/10/22 0345 07/10/22 0525 07/12/22 0535  LIPASE 22 <10* <10*   No results for input(s): "AMMONIA" in the last 168 hours. Coagulation Profile: No results for input(s): "INR", "PROTIME" in the last 168 hours. Cardiac Enzymes: No results for input(s): "CKTOTAL", "CKMB", "CKMBINDEX", "TROPONINI" in the last 168 hours. BNP (last 3 results) No results for input(s): "PROBNP" in the last 8760 hours. HbA1C: No results for input(s): "HGBA1C" in the last 72 hours. CBG: Recent Labs  Lab 07/10/22 0527 07/12/22 0523  GLUCAP 213* 158*   Lipid Profile: No results for input(s): "CHOL", "HDL", "LDLCALC", "TRIG", "CHOLHDL", "LDLDIRECT" in the last 72 hours. Thyroid Function Tests: No results for input(s): "TSH", "T4TOTAL", "FREET4", "T3FREE", "THYROIDAB" in the last 72 hours. Anemia Panel: No results for  input(s): "VITAMINB12", "FOLATE", "FERRITIN", "TIBC", "IRON", "RETICCTPCT" in the last 72 hours.  --------------------------------------------------------------------------------------------------------------- Urine analysis:    Component Value Date/Time   COLORURINE YELLOW 07/10/2022 0410   APPEARANCEUR CLEAR 07/10/2022 0410   LABSPEC 1.015 07/10/2022 0410   PHURINE 7.0  07/10/2022 0410   GLUCOSEU >=500 (A) 07/10/2022 0410   HGBUR NEGATIVE 07/10/2022 0410   BILIRUBINUR NEGATIVE 07/10/2022 0410   BILIRUBINUR negative 08/30/2020 1631   KETONESUR NEGATIVE 07/10/2022 0410   PROTEINUR >=300 (A) 07/10/2022 0410   UROBILINOGEN 0.2 08/30/2020 1631   UROBILINOGEN 1.0 08/11/2008 1745   NITRITE NEGATIVE 07/10/2022 0410   LEUKOCYTESUR NEGATIVE 07/10/2022 0410      Imaging Results:    DG Chest Port 1 View  Result Date: 07/12/2022 CLINICAL DATA:  Worsening shortness of breath EXAM: PORTABLE CHEST 1 VIEW COMPARISON:  Chest radiograph 07/12/2022 FINDINGS: Single lead AICD device overlies the left hemithorax. Monitoring leads overlie the patient. Stable cardiomegaly. Small bilateral pleural effusions and underlying consolidation. Pulmonary vascular redistribution. No pneumothorax. IMPRESSION: 1. Cardiomegaly with pulmonary vascular congestion. 2. Small bilateral pleural effusions with underlying consolidation. Electronically Signed   By: Lovey Newcomer M.D.   On: 07/12/2022 09:39   DG Chest Port 1 View  Result Date: 07/12/2022 CLINICAL DATA:  Cough and chest pain. EXAM: PORTABLE CHEST 1 VIEW COMPARISON:  06/19/2022 FINDINGS: 0624 hours. The cardio pericardial silhouette is enlarged. There is pulmonary vascular congestion without overt pulmonary edema. Bibasilar atelectasis or infiltrate noted with small bilateral pleural effusions. Left-sided AICD again noted. Telemetry leads overlie the chest. IMPRESSION: Enlargement of the cardiopericardial silhouette with bibasilar atelectasis or infiltrate and small bilateral pleural effusions. Electronically Signed   By: Misty Stanley M.D.   On: 07/12/2022 07:00    My personal review of EKG: Rhythm NSR, T wave flattening noted in lead I and V6   Assessment & Plan:    Principal Problem:   Acute exacerbation of CHF (congestive heart failure) (HCC) Active Problems:   Chest pain   Acute hypoxemic respiratory failure-multifactorial  likely from underlying CHF exacerbation, BNP elevated 1000.  Chest x-ray showed pulmonary vascular congestion.  Patient already given Lasix 60 mg IV, will follow strict intake and output.  She also has elevated D-dimer of 7.03.  Concern for PE.  CT chest could not be obtained.  VQ scan has been ordered.  Will follow V/Q results. Chest pain-troponin elevated 110, 95.  Appears atypical as patient states that chest pain started after she was coughing yesterday.  Does have musculoskeletal component.  T wave flattening in lead I and V6.  If VQ scan is negative consider cardiology consultation.  Will continue to cycle troponin. Acute on chronic HFpEF-BNP elevated 1000, started on Lasix as above.  Follow diuretic response. CKD stage IV-creatinine 3.04, at baseline.  Started on diuretics as above.  Follow renal function in a.m. Diabetic gastroparesis-presented with intractable nausea and vomiting.  Will start Reglan 10 mg IV every 6 hours. Hypertensive urgency-blood pressure is elevated, she was taking Toprol XL at home.  Will start metoprolol 2.5 mg IV every 6 hours.  Hydralazine 10 mg IV every 6 hours as needed for BP more than 160/100. Diabetes mellitus type 1-patient uses insulin pump at home which she turned off this morning due to hypoglycemia.  Will start moderate sliding scale insulin with NovoLog.  Continue to monitor patient CBG. Anemia of chronic disease-hemoglobin is 8.8, at baseline.  Denies vomiting blood.    DVT Prophylaxis-   heparin  AM Labs Ordered, also  please review Full Orders  Family Communication: Admission, patients condition and plan of care including tests being ordered have been discussed with the patient  who indicate understanding and agree with the plan and Code Status.  Code Status: Full code  Admission status: Observation/Inpatient :The appropriate admission status for this patient is INPATIENT. Inpatient status is judged to be reasonable and necessary in order to provide  the required intensity of service to ensure the patient's safety. The patient's presenting symptoms, physical exam findings, and initial radiographic and laboratory data in the context of their chronic comorbidities is felt to place them at high risk for further clinical deterioration. Furthermore, it is not anticipated that the patient will be medically stable for discharge from the hospital within 2 midnights of admission. The following factors support the admission status of inpatient.      Time spent in minutes : 60 minutes   Skyeler Smola S Layne Dilauro M.D

## 2022-07-12 NOTE — ED Provider Notes (Signed)
[facility] Provider Note: Georgena Spurling, MD, FACEP  CSN: [id number] MRN: [id number] ARRIVAL: 07/12/22 at [place]: [place]  Chest Pain   HISTORY OF PRESENT ILLNESS  07/12/22 5:28 AM [name] is a 51 y.o. female with multiple medical problems including diabetic gastroparesis.  She was seen here 2 days ago for gastroparesis.  Since being discharged home she has had persistent nausea and vomiting as well as cough productive of foamy sputum.  She has also had central chest pain since yesterday.  The chest pain   Past Medical History:  Diagnosis Date   Anemia    Anxiety    Arthritis    Asthma    CHF (congestive heart failure) ([place])    Depression    Diabetic ulcer of left foot ([place]) 05/12/2013   Gastroparesis    Generalized abdominal pain    History of chicken pox    Loss of weight 12/02/2019   Migraines    Mood disorder (H[facility])    anxiety   Prurigo nodularis    with diabetic dermopathy   Type 1 diabetes, uncontrolled, with neuropathy    [place]   Ulcers of both lower legs ([place]) 02/20/2014    Past Surgical History:  Procedure Laterality Date   BIOPSY  08/11/2020   Procedure: BIOPSY;  Surgeon: Ladene Artist, MD;  Location: [facility];  Service: Endoscopy;;   BIOPSY  04/02/2022   Procedure: BIOPSY;  Surgeon: Jackquline Denmark, MD;  Location: [facility];  Service: Gastroenterology;;   ESOPHAGOGASTRODUODENOSCOPY (EGD) WITH PROPOFOL N/A 08/11/2020   Procedure: ESOPHAGOGASTRODUODENOSCOPY (EGD) WITH PROPOFOL;  Surgeon: Ladene Artist, MD;  Location: [facility];  Service: Endoscopy;  Laterality: N/A;   ESOPHAGOGASTRODUODENOSCOPY (EGD) WITH PROPOFOL N/A 04/02/2022   Procedure: ESOPHAGOGASTRODUODENOSCOPY (EGD) WITH PROPOFOL;  Surgeon: Jackquline Denmark, MD;  Location: [facility];  Service: Gastroenterology;  Laterality: N/A;   ESOPHAGOGASTRODUODENOSCOPY (EGD) WITH PROPOFOL N/A 05/23/2022   Procedure: ESOPHAGOGASTRODUODENOSCOPY (EGD) WITH PROPOFOL;   Surgeon: Lucilla Lame, MD;  Location: [facility];  Service: Endoscopy;  Laterality: N/A;   ICD IMPLANT N/A 04/09/2022   Procedure: ICD IMPLANT;  Surgeon: Constance Haw, MD;  Location: [facility];  Service: Cardiovascular;  Laterality: N/A;   treadmill stress test  01/2013   WNL, low risk study    Family History  Problem Relation Age of Onset   Diabetes Mother        type 2   Hypertension Mother    Thyroid disease Mother    Bipolar disorder Mother    Heart disease Mother    Calcium disorder Mother    Cancer Maternal Grandmother        Breast, stomach   Cancer Paternal Grandmother        stomach, lung (smoker)   Diabetes Paternal Grandmother    Diabetes Paternal Grandfather    Heart failure Sister    Diabetes Sister    Stroke Maternal Aunt    Cancer Maternal Uncle        prostate   CAD Maternal Aunt        stents   Cancer Maternal Aunt 64       ovarian    Social History   Tobacco Use   Smoking status: Never   Smokeless tobacco: Never  Vaping Use   Vaping Use: Never used  Substance Use Topics   Alcohol use: Not Currently   Drug use: No    Prior to Admission medications   Medication Sig Start  Date End Date Taking? Authorizing Provider  acetaminophen (TYLENOL) 500 MG tablet Take 1,000 mg by mouth 3 (three) times daily as needed for moderate pain.    [provider]  albuterol (PROAIR HFA) 108 (90 Base) MCG/ACT inhaler Inhale 2 puffs into the lungs every 6 (six) hours as needed for wheezing or shortness of breath. 07/03/20   [name], PA-C  Carboxymethylcellul-Glycerin (LUBRICATING EYE DROPS OP) Place 1-2 drops into both eyes daily as needed (dry eyes).    [provider]  Continuous Blood Gluc Sensor (FREESTYLE LIBRE 2 SENSOR) MISC Inject 1 Device into the skin every 14 (fourteen) days. 03/20/20   [provider]  Ensure Max Protein (ENSURE MAX PROTEIN) LIQD Take 330 mLs (11 oz total) by mouth daily. 04/30/22   Hongalgi,  [name], MD  hydrOXYzine (ATARAX) 50 MG tablet Take 50 mg by mouth in the morning, at noon, and at bedtime.    [provider]  LORazepam (ATIVAN) 0.5 MG tablet Take 1 tablet (0.5 mg total) by mouth every 8 (eight) hours as needed for anxiety (Refractory nausea and vomiting). 05/24/22   Rai, [name], MD  metoCLOPramide (REGLAN) 10 MG tablet Take 1 tablet (10 mg total) by mouth 3 (three) times daily before meals. 05/24/22   Rai, [name], MD  metoprolol succinate (TOPROL XL) 25 MG 24 hr tablet Take 1 tablet (25 mg total) by mouth daily. 04/19/22   [name], PA-C  Multiple Vitamin (MULTIVITAMIN WITH MINERALS) TABS tablet Take 1 tablet by mouth daily. 04/30/22   Hongalgi, [name], MD  pantoprazole (PROTONIX) 40 MG tablet Take 1 tablet (40 mg total) by mouth 2 (two) times daily before a meal. 05/24/22 07/23/22  Rai, Ripudeep K, MD  polyethylene glycol (MIRALAX / GLYCOLAX) 17 g packet Take 17 g by mouth daily as needed for moderate constipation.    [provider]  pregabalin (LYRICA) 50 MG capsule Take 50 mg by mouth 2 (two) times daily as needed (neuropathy). 12/05/21   [provider]  prochlorperazine (COMPAZINE) 10 MG tablet Take 1 tablet (10 mg total) by mouth every 6 (six) hours as needed for nausea or vomiting. 05/24/22   Rai, [name], MD  promethazine (PHENERGAN) 25 MG suppository Place 1 suppository (25 mg total) rectally every 6 (six) hours as needed for nausea or vomiting. 05/24/22   Rai, Ripudeep K, MD  rosuvastatin (CRESTOR) 20 MG tablet Take 1 tablet (20 mg total) by mouth daily. 05/24/22 09/21/22  Rai, [name], MD  scopolamine (TRANSDERM-SCOP) 1 MG/3DAYS Place 1 patch (1.5 mg total) onto the skin every 3 (three) days. 05/26/22   Rai, Ripudeep K, MD  tobramycin (TOBREX) 0.3 % ophthalmic solution Place 1-2 drops into both eyes See admin instructions. Instill 1-2 drops into both eyes 3 times daily the day before, day of, and the day after every 6  weeks eye injections per patient 11/17/20   [provider]  torsemide (DEMADEX) 20 MG tablet Take 20 mg by mouth daily as needed (swelling). 12/25/21   [provider]    Allergies Atorvastatin, Dulaglutide, Zofran [ondansetron], Amoxicillin, Lantus [insulin glargine], and Miconazole nitrate   REVIEW OF SYSTEMS  Negative except as noted here or in the History of Present Illness.   PHYSICAL EXAMINATION  Initial Vital Signs Blood pressure (!) 195/118, pulse (!) 107, temperature 98.3 F (36.8 C), resp. rate (!) 35, last menstrual period 11/17/2021, SpO2 92 %.  Examination General: Well-developed, well-nourished female in no acute  distress; appears older than age of record HENT: normocephalic; atraumatic Eyes: Pale conjunctivae Neck: supple Heart: regular rate and rhythm; tachycardia Lungs: clear to auscultation bilaterally; tachypnea Chest: Pacer defibrillator left upper quadrant Abdomen: soft; nondistended; diffusely tender bowel sounds present Extremities: No deformity; full range of motion Neurologic: Awake, alert and oriented; motor function intact in all extremities and symmetric; no facial droop Skin: Warm and dry Psychiatric: Grimacing   RESULTS  Summary of this visit's results, reviewed and interpreted by myself:   EKG Interpretation  Date/Time:  Friday July 12 2022 05:23:15 EST Ventricular Rate:  108 PR Interval:  139 QRS Duration: 86 QT Interval:  393 QTC Calculation: 527 R Axis:   76 Text Interpretation: Sinus tachycardia Probable left atrial enlargement Borderline T wave abnormalities Prolonged QT interval No significant change was found Confirmed by Shanon Rosser [phone]) on 07/12/2022 5:28:16 AM       Laboratory Studies: Results for orders placed or performed during the hospital encounter of 07/12/22 (from the past 24 hour(s))  CBG monitoring, ED     Status: Abnormal   Collection Time: 07/12/22  5:23 AM  Result Value Ref Range    Glucose-Capillary 158 (H) 70 - 99 mg/dL  CBC with Differential     Status: Abnormal   Collection Time: 07/12/22  5:35 AM  Result Value Ref Range   WBC 6.9 4.0 - 10.5 K/uL   RBC 3.09 (L) 3.87 - 5.11 MIL/uL   Hemoglobin 8.7 (L) 12.0 - 15.0 g/dL   HCT 26.7 (L) 36.0 - 46.0 %   MCV 86.4 80.0 - 100.0 fL   MCH 28.2 26.0 - 34.0 pg   MCHC 32.6 30.0 - 36.0 g/dL   RDW 16.2 (H) 11.5 - 15.5 %   Platelets 245 150 - 400 K/uL   nRBC 0.0 0.0 - 0.2 %   Neutrophils Relative % 82 %   Neutro Abs 5.6 1.7 - 7.7 K/uL   Lymphocytes Relative 15 %   Lymphs Abs 1.1 0.7 - 4.0 K/uL   Monocytes Relative 2 %   Monocytes Absolute 0.2 0.1 - 1.0 K/uL   Eosinophils Relative 1 %   Eosinophils Absolute 0.0 0.0 - 0.5 K/uL   Basophils Relative 0 %   Basophils Absolute 0.0 0.0 - 0.1 K/uL   Immature Granulocytes 0 %   Abs Immature Granulocytes 0.03 0.00 - 0.07 K/uL  Comprehensive metabolic panel     Status: Abnormal   Collection Time: 07/12/22  5:35 AM  Result Value Ref Range   Sodium 142 135 - 145 mmol/L   Potassium 4.0 3.5 - 5.1 mmol/L   Chloride 109 98 - 111 mmol/L   CO2 21 (L) 22 - 32 mmol/L   Glucose, Bld 175 (H) 70 - 99 mg/dL   BUN 23 (H) 6 - 20 mg/dL   Creatinine, Ser 3.04 (H) 0.44 - 1.00 mg/dL   Calcium 8.2 (L) 8.9 - 10.3 mg/dL   Total Protein 6.3 (L) 6.5 - 8.1 g/dL   Albumin 3.3 (L) 3.5 - 5.0 g/dL   AST 16 15 - 41 U/L   ALT 13 0 - 44 U/L   Alkaline Phosphatase 89 38 - 126 U/L   Total Bilirubin 1.2 0.3 - 1.2 mg/dL   GFR, Estimated 18 (L) >60 mL/min   Anion gap 12 5 - 15  Lipase, blood     Status: Abnormal   Collection Time: 07/12/22  5:35 AM  Result Value Ref Range   Lipase <10 (L) 11 - 51 U/L  Troponin I (High Sensitivity)     Status: Abnormal   Collection Time: 07/12/22  5:35 AM  Result Value Ref Range   Troponin I (High Sensitivity) 110 (HH) <18 ng/L  D-dimer, quantitative     Status: Abnormal   Collection Time: 07/12/22  5:35 AM  Result Value Ref Range   D-Dimer, Quant 7.03 (H) 0.00 - 0.50  ug/mL-FEU  Brain natriuretic peptide     Status: Abnormal   Collection Time: 07/12/22  5:35 AM  Result Value Ref Range   B Natriuretic Peptide 1,035.3 (H) 0.0 - 100.0 pg/mL  Resp Panel by RT-PCR (Flu A&B, Covid) Anterior Nasal Swab     Status: None   Collection Time: 07/12/22  5:42 AM   Specimen: Anterior Nasal Swab  Result Value Ref Range   SARS Coronavirus 2 by RT PCR NEGATIVE NEGATIVE   Influenza A by PCR NEGATIVE NEGATIVE   Influenza B by PCR NEGATIVE NEGATIVE  I-Stat venous blood gas, [facility] ED only)     Status: Abnormal   Collection Time: 07/12/22  5:51 AM  Result Value Ref Range   pH, Ven 7.404 7.25 - 7.43   pCO2, Ven 34.4 (L) 44 - 60 mmHg   pO2, Ven 28 (LL) 32 - 45 mmHg   Bicarbonate 21.5 20.0 - 28.0 mmol/L   TCO2 23 22 - 32 mmol/L   O2 Saturation 54 %   Acid-base deficit 3.0 (H) 0.0 - 2.0 mmol/L   Sodium 142 135 - 145 mmol/L   Potassium 3.8 3.5 - 5.1 mmol/L   Calcium, Ion 1.17 1.15 - 1.40 mmol/L   HCT 26.0 (L) 36.0 - 46.0 %   Hemoglobin 8.8 (L) 12.0 - 15.0 g/dL   Patient temperature 98.3 F    Collection site IV start    Drawn by Nurse    Sample type VENOUS    Comment NOTIFIED PHYSICIAN    Imaging Studies: DG Chest Port 1 View  Result Date: 07/12/2022 CLINICAL DATA:  Cough and chest pain. EXAM: PORTABLE CHEST 1 VIEW COMPARISON:  06/19/2022 FINDINGS: 0624 hours. The cardio pericardial silhouette is enlarged. There is pulmonary vascular congestion without overt pulmonary edema. Bibasilar atelectasis or infiltrate noted with small bilateral pleural effusions. Left-sided AICD again noted. Telemetry leads overlie the chest. IMPRESSION: Enlargement of the cardiopericardial silhouette with bibasilar atelectasis or infiltrate and small bilateral pleural effusions. Electronically Signed   By: Misty Stanley M.D.   On: 07/12/2022 07:00    ED COURSE and MDM  Nursing notes, initial and subsequent vitals signs, including pulse oximetry, reviewed and interpreted by myself.  Vitals:    07/12/22 0600 07/12/22 0630 07/12/22 0638 07/12/22 0700  BP: (!) 196/115 (!) 179/103  (!) 178/104  Pulse: (!) 105 100 100 99  Resp: (!) 28 (!) 22 15 (!) 21  Temp:      SpO2: 96% 90% 98% 94%   Medications  fentaNYL (SUBLIMAZE) injection 100 mcg (has no administration in time range)  pantoprazole (PROTONIX) injection 40 mg (has no administration in time range)  lactated ringers bolus 1,000 mL (has no administration in time range)  promethazine (PHENERGAN) 25 MG/ML injection (  Not Given 07/12/22 0607)  promethazine (PHENERGAN) injection 25 mg (25 mg Intramuscular Given 07/12/22 0616)   6:32 AM Nursing staff has been unable to secure peripheral IV access and the patient vehemently refused an EJ or IJ.  There are morning shift nurses profession and ultrasound assisted placement of IVs.  The patient's pain is pleuritic and her D-dimer  is over 7.  She will need to be evaluated for pulmonary embolism but with her chronic kidney disease, slightly worse likely due to recent nausea and vomiting, will require a VQ scan.   7:32 AM Dr. Darrick Meigs accepts for admission to the hospitalist service.  Dr. Kathrynn Humble, morning EDP, made aware of patient.  PROCEDURES  Procedures   ED DIAGNOSES     ICD-10-CM   1. Central chest pain  R07.9     2. Elevated d-dimer  R79.89     3. AKI (acute kidney injury) ([place])  N17.9     4. Diabetic gastroparesis ([place])  E11.43    K31.84     5. Chronic anemia  D64.9     6. Elevated troponin  R79.89          Semir Brill, Jenny Reichmann, MD 07/12/22 [phone]

## 2022-07-12 NOTE — ED Notes (Addendum)
Pt given Fent, given late due to access issue. O2 went to high 80's, O2 increased to 3lpm and RT notified. Provider also notified and he ordered the fluids to be discontinued.

## 2022-07-12 NOTE — ED Notes (Signed)
RT obtained the following results from [facility]. Pt on RA at 95%. MD notified of any critical values.

## 2022-07-12 NOTE — ED Notes (Signed)
Pt placed on 3L [place] due to O2 sats of 88-91%. O2 improved to 96% with 

## 2022-07-12 NOTE — ED Triage Notes (Signed)
Pt c/o central chest pain that radiates to her right arm. Pt was seen here on 12/6 for gastroparesis

## 2022-07-12 NOTE — ED Notes (Signed)
Date and time results received: 07/12/22 0638 (use smartphrase ".now" to insert current time)  Test: troponin Critical Value: 110  Name of Provider Notified: Severiano Gilbert, MD  Orders Received? Or Actions Taken?:  n/a

## 2022-07-12 NOTE — ED Provider Notes (Addendum)
Physical Exam  BP (!) 178/104   Pulse 99   Temp 98.3 F (36.8 C)   Resp (!) 21   LMP 11/17/2021 (Approximate)   SpO2 (!) 85%   Physical Exam  Procedures  .Critical Care  Performed by: Varney Biles, MD Authorized by: Varney Biles, MD   Critical care provider statement:    Critical care time (minutes):  32   Critical care was necessary to treat or prevent imminent or life-threatening deterioration of the following conditions:  Respiratory failure   Critical care was time spent personally by me on the following activities:  Development of treatment plan with patient or surrogate, discussions with consultants, evaluation of patient's response to treatment, examination of patient, ordering and review of laboratory studies, ordering and review of radiographic studies, ordering and performing treatments and interventions, pulse oximetry, re-evaluation of patient's condition and review of old charts   ED Course / MDM    Medical Decision Making Amount and/or Complexity of Data Reviewed Labs: ordered. Radiology: ordered.  Risk OTC drugs. Prescription drug management. Decision regarding hospitalization.   51 year old patient with history of gastroparesis, GI bleed, diabetes, CKD, diastolic CHF, ICD placement because of cardiac arrest and echocardiogram revealing elevated pulmonary pressures comes in with chief complaint of nausea, vomiting but also shortness of breath and chest pain.  Nausea and vomiting is secondary to gastroparesis most likely.  Patient's creatinine has gone up slightly.  Patient's chest pain is atypical.  Central, moving to the right side and it is worse with inspiration. Patient's BNP, high-sensitivity troponin and D-dimer are all elevated.  At 9:15 AM: Patient started having new hypoxia.  She is tachypneic.  She has been placed on 6 L of oxygen. On exam, there is no rales still.  There is clear new change in her respiratory status.  She is tachypneic,  hypoxic.   I reviewed patient's chart.  Her echocardiogram revealed grade 2 diastolic CHF, mildly elevated pulmonary artery pressure.  I reviewed patient's CT angio chest from 04-2022.  She did not have any dissection, aneurysm history.  There was no large PE noted at that time.   EKG Interpretation  Date/Time:  Friday July 12 2022 09:35:15 EST Ventricular Rate:  109 PR Interval:  133 QRS Duration: 88 QT Interval:  354 QTC Calculation: 477 R Axis:   71 Text Interpretation: Sinus tachycardia Probable left atrial enlargement Nonspecific T abnormalities, lateral leads No acute changes No significant change since last tracing Confirmed by Varney Biles [phone]) on 07/12/2022 9:38:36 AM         Currently patient is awaiting transfer to [facility]. Working diagnosis is elevated troponin secondary to CHF versus PE.  VQ scan has been ordered.  Patient has acute on chronic renal failure.  During my assessment patient was noted to have elevated blood pressure. I have ordered chest x-ray and EKG.  EKG showing no acute changes.  Once the x-ray is completed, there is no evidence of new heart failure then I will give patient some metoprolol and also Lasix.  I have requested transfer of the patient to [facility], ER given this new change in her respiratory status.  Hospitalist team made aware of this request via secure chat message.  Dr. Tyrone Nine excepting the patient.   10:09 AM X-ray interpreted independently.  Vascular congestion noted.  I have given IV Lasix. Repeat troponin is better. Unfortunately, patient has left the ER.  X-ray findings are not critical to necessitate direct conversation with EDP or hospitalist and therefore  I have sent a secure chat message to [facility], ED about Lasix 60 mg IV that I ordered in the x-ray findings.  Varney Biles, MD 07/12/22 [id number]    Varney Biles, MD 07/12/22 1010

## 2022-07-13 DIAGNOSIS — I5033 Acute on chronic diastolic (congestive) heart failure: Secondary | ICD-10-CM | POA: Diagnosis not present

## 2022-07-13 LAB — IRON AND TIBC
Iron: 29 ug/dL (ref 28–170)
Saturation Ratios: 13 % (ref 10.4–31.8)
TIBC: 221 ug/dL — ABNORMAL LOW (ref 250–450)
UIBC: 192 ug/dL

## 2022-07-13 LAB — FERRITIN: Ferritin: 134 ng/mL (ref 11–307)

## 2022-07-13 LAB — GLUCOSE, CAPILLARY
Glucose-Capillary: 169 mg/dL — ABNORMAL HIGH (ref 70–99)
Glucose-Capillary: 195 mg/dL — ABNORMAL HIGH (ref 70–99)
Glucose-Capillary: 186 mg/dL — ABNORMAL HIGH (ref 70–99)
Glucose-Capillary: 43 mg/dL — CL (ref 70–99)
Glucose-Capillary: 83 mg/dL (ref 70–99)

## 2022-07-13 LAB — COMPREHENSIVE METABOLIC PANEL
ALT: 14 U/L (ref 0–44)
AST: 10 U/L — ABNORMAL LOW (ref 15–41)
Albumin: 2.3 g/dL — ABNORMAL LOW (ref 3.5–5.0)
Alkaline Phosphatase: 72 U/L (ref 38–126)
Anion gap: 9 (ref 5–15)
BUN: 22 mg/dL — ABNORMAL HIGH (ref 6–20)
CO2: 24 mmol/L (ref 22–32)
Calcium: 7.9 mg/dL — ABNORMAL LOW (ref 8.9–10.3)
Chloride: 108 mmol/L (ref 98–111)
Creatinine, Ser: 3.14 mg/dL — ABNORMAL HIGH (ref 0.44–1.00)
GFR, Estimated: 17 mL/min — ABNORMAL LOW (ref >60)
Glucose, Bld: 205 mg/dL — ABNORMAL HIGH (ref 70–99)
Potassium: 4.1 mmol/L (ref 3.5–5.1)
Sodium: 141 mmol/L (ref 135–145)
Total Bilirubin: 1.2 mg/dL (ref 0.3–1.2)
Total Protein: 5 g/dL — ABNORMAL LOW (ref 6.5–8.1)

## 2022-07-13 LAB — CBC
HCT: 23.6 % — ABNORMAL LOW (ref 36.0–46.0)
Hemoglobin: 7.4 g/dL — ABNORMAL LOW (ref 12.0–15.0)
MCH: 27.7 pg (ref 26.0–34.0)
MCHC: 31.4 g/dL (ref 30.0–36.0)
MCV: 88.4 fL (ref 80.0–100.0)
Platelets: 253 10*3/uL (ref 150–400)
RBC: 2.67 MIL/uL — ABNORMAL LOW (ref 3.87–5.11)
RDW: 15.9 % — ABNORMAL HIGH (ref 11.5–15.5)
WBC: 8.5 10*3/uL (ref 4.0–10.5)
nRBC: 0 % (ref 0.0–0.2)

## 2022-07-13 LAB — RETICULOCYTES
Immature Retic Fract: 26.5 % — ABNORMAL HIGH (ref 2.3–15.9)
RBC.: 2.68 MIL/uL — ABNORMAL LOW (ref 3.87–5.11)
Retic Count, Absolute: 68.9 10*3/uL (ref 19.0–186.0)
Retic Ct Pct: 2.6 % (ref 0.4–3.1)

## 2022-07-13 LAB — VITAMIN B12: Vitamin B-12: 378 pg/mL (ref 180–914)

## 2022-07-13 LAB — FOLATE: Folate: 8.6 ng/mL (ref >5.9)

## 2022-07-13 MED ORDER — HYDRALAZINE HCL 25 MG PO TABS
25.0000 mg | ORAL_TABLET | Freq: Three times a day (TID) | ORAL | Status: DC
  Administered 2022-07-13 – 2022-07-15 (×8): 25 mg via ORAL
  Filled 2022-07-13 (×8): qty 1

## 2022-07-13 MED ORDER — PROCHLORPERAZINE EDISYLATE 10 MG/2ML IJ SOLN
5.0000 mg | Freq: Four times a day (QID) | INTRAMUSCULAR | Status: DC | PRN
  Administered 2022-07-13 – 2022-07-17 (×4): 5 mg via INTRAVENOUS
  Filled 2022-07-13 (×5): qty 1

## 2022-07-13 MED ORDER — INSULIN DETEMIR 100 UNIT/ML ~~LOC~~ SOLN
10.0000 [IU] | Freq: Every day | SUBCUTANEOUS | Status: DC
  Administered 2022-07-13: 10 [IU] via SUBCUTANEOUS
  Filled 2022-07-13 (×2): qty 0.1

## 2022-07-13 MED ORDER — METOPROLOL TARTRATE 5 MG/5ML IV SOLN
5.0000 mg | Freq: Four times a day (QID) | INTRAVENOUS | Status: DC
  Administered 2022-07-13 – 2022-07-15 (×9): 5 mg via INTRAVENOUS
  Filled 2022-07-13 (×9): qty 5

## 2022-07-13 MED ORDER — FUROSEMIDE 10 MG/ML IJ SOLN
60.0000 mg | Freq: Two times a day (BID) | INTRAMUSCULAR | Status: AC
  Administered 2022-07-13 (×2): 60 mg via INTRAVENOUS
  Filled 2022-07-13 (×2): qty 6

## 2022-07-13 MED ORDER — PANTOPRAZOLE SODIUM 40 MG PO TBEC
40.0000 mg | DELAYED_RELEASE_TABLET | Freq: Two times a day (BID) | ORAL | Status: DC
  Administered 2022-07-13 – 2022-07-17 (×9): 40 mg via ORAL
  Filled 2022-07-13 (×9): qty 1

## 2022-07-13 MED ORDER — INSULIN ASPART 100 UNIT/ML IJ SOLN
0.0000 [IU] | Freq: Three times a day (TID) | INTRAMUSCULAR | Status: DC
  Administered 2022-07-13 – 2022-07-14 (×3): 2 [IU] via SUBCUTANEOUS
  Administered 2022-07-15: 1 [IU] via SUBCUTANEOUS
  Administered 2022-07-15 – 2022-07-16 (×3): 2 [IU] via SUBCUTANEOUS
  Administered 2022-07-16: 3 [IU] via SUBCUTANEOUS
  Administered 2022-07-16: 1 [IU] via SUBCUTANEOUS
  Administered 2022-07-17: 3 [IU] via SUBCUTANEOUS

## 2022-07-13 MED ORDER — LORAZEPAM 2 MG/ML IJ SOLN
1.0000 mg | Freq: Four times a day (QID) | INTRAMUSCULAR | Status: DC | PRN
  Administered 2022-07-13 – 2022-07-16 (×6): 1 mg via INTRAVENOUS
  Filled 2022-07-13 (×7): qty 1

## 2022-07-13 MED ORDER — BOOST / RESOURCE BREEZE PO LIQD CUSTOM
1.0000 | Freq: Three times a day (TID) | ORAL | Status: DC
  Administered 2022-07-13: 1 via ORAL

## 2022-07-13 MED ORDER — ONDANSETRON HCL 4 MG/2ML IJ SOLN
4.0000 mg | Freq: Four times a day (QID) | INTRAMUSCULAR | Status: DC | PRN
  Administered 2022-07-16: 4 mg via INTRAVENOUS
  Filled 2022-07-13: qty 2

## 2022-07-13 NOTE — Inpatient Diabetes Management (Signed)
Inpatient Diabetes Program Recommendations  AACE/ADA: New Consensus Statement on Inpatient Glycemic Control   Target Ranges:  Prepandial:   less than 140 mg/dL      Peak postprandial:   less than 180 mg/dL (1-2 hours)      Critically ill patients:  140 - 180 mg/dL    Latest Reference Range & Units 07/12/22 05:23 07/12/22 17:44 07/12/22 21:27 07/13/22 06:27 07/13/22 11:45  Glucose-Capillary 70 - 99 mg/dL 158 (H) 240 (H) 176 (H) 169 (H) 195 (H)    Review of Glycemic Control  Diabetes history: Type 1 DM (requires basal, carb coverage, and correction insulin) Outpatient Diabetes medications: OmniPod insulin pump with Novolog; Tresiba 50 units daily if not using pump; FreeStyle Libre3 CGM Current orders for Inpatient glycemic control: Levemir 10 units daily, Novolog 0-15 units TID with meals, Novolog 0-5 units QHS  Inpatient Diabetes Program Recommendations:    Insulin: Please consider decreasing Novolog to 0-9 units TID with meals.  NOTE: Noted consult for Diabetes Coordinator. Diabetes Coordinator is not on campus over the weekend but available by pager from 8am to 5pm for questions or concerns. Chart reviewed. Patient known to inpatient diabetes team and was seen by inpatient diabetes coordinator on 06/19/22 during prior admission (also seen on 05/23/22 during another admission). Patient has DM1 and uses OmniPod insulin pump with Novlog as an outpatient for DM control. Per H&P on 07/12/22, patient reported she turned off pump that morning due to hypoglycemia. Per note today by Dr. Broadus John, patient took off her insulin pump yesterday. Based on note by diabetes coordinator on 06/19/22 insulin pump settings should be: Basal Insulin 1 unit/hour Total Daily Basal: 24 units/day   Insulin to Carb Ratio 1:9 grams (1 unit covers 9 grams of carbs) Insulin Sensitivity Factor 1:34 mg/dl (1 unit drops glucose 34 mg/dl) Target Glucose 110-140 mg/dl  Diabetes team will follow along while  inpatient.  Thanks, Barnie Alderman, RN, MSN, [place] Diabetes Coordinator Inpatient Diabetes Program [phone] (Team Pager from 8am to [place])

## 2022-07-13 NOTE — Progress Notes (Signed)
Hypoglycemia-  Around 9pm patient called out from room requesting Apple Juice stating she felt her sugar was low.  Her personal meter was reading 53.  Patient had visible tremors but was alert and oriented.    Patient glucose checked = 43.  Patient given 8oz of apple juice.  Glucose recheck was 83.  Will continue to monitor.  Patient has a poor appetite and likely has poor intake of food due to ongoing nausea and continued illness.

## 2022-07-13 NOTE — Progress Notes (Incomplete)
02 saturations.  Decreased 02 from 4L to 2L Patient 87% on room air.

## 2022-07-13 NOTE — Progress Notes (Addendum)
PROGRESS NOTE    [name]  KPV:[id number] DOB: 29-Apr-1971 DOA: 07/12/2022 PCP: [name], PA  50/F with history of type 1 diabetes mellitus, diabetic gastroparesis, recurrent admissions for nausea and vomiting, CKD 4, cardiac arrest 04/05/2022 (witnessed cardiac arrest at a restaurant, able AED advised shock, echo with preserved EF, mildly elevated PASP, underwent single-chamber ICD on 9/5. -Since then has been admitted multiple times with nausea and vomiting, seen by gastroenterology, scheduled for a gastric emptying scan on Tuesday. -Presented to the ED with cough, intermittent chest pain and nausea vomiting, chest x-ray noted pulmonary vascular congestion and small pleural effusions   Subjective: -Continues to have frequent nausea and gagging  Assessment and Plan:  Acute on chronic diastolic CHF, chest pain Frequent chest discomfort following CPR few months ago -Clinically do not suspect ACS, VQ scan negative -Recent echo with preserved EF, no wall motion abnormalities -Clinically mildly volume overloaded with bilateral pleural effusions, continue IV Lasix today -GDMT limited by CKD 4 -Her situation is complicated by recurrent nausea and vomiting and gastroparesis  Recurrent nausea and vomiting -Presumed gastroparesis and recent gastritis, hiatal hernia -Seen by gastroenterology last 2 admissions, EGD 10/23 noted grade C reflux esophagitis, no bleeding, normal stomach and duodenum -Continue PPI, will discontinue Reglan as she is scheduled for a gastric emptying scan, recommended small frequent meals, add boost, Phenergan as needed -She needs to be seen at a tertiary center which is still pending  History of aborted V-fib arrest -Status post Medtronic single-chamber ICD placed 9/23 -Echo with preserved EF  Type 1 diabetes mellitus -Took off her insulin pump yesterday, now on sliding scale insulin, will add 10 units of Levemir, p.o. intake is poor and unreliable with  above issues, diabetes coordinator consult  CKD 4, baseline creatinine around 2.5-3 -Followed by nephrology, missed her recent appointment -Monitor with diuretics  Hypertensive urgency -Add oral hydralazine, diuretics as above  Chronic anemia -With worsening, check anemia panel    DVT prophylaxis: Heparin subcutaneous Code Status: Full code Family Communication: Discussed with patient in detail, no family at bedside Disposition Plan: Home pending clinical improvement  Consultants:    Procedures:   Antimicrobials:    Objective: Vitals:   07/13/22 0014 07/13/22 0433 07/13/22 0836 07/13/22 0837  BP: (!) 156/95 (!) 175/108 (!) 157/90   Pulse: 86 85 88   Resp: 20 20 (!) 28   Temp: 98.4 F (36.9 C) 98.3 F (36.8 C) 98.5 F (36.9 C)   TempSrc: Oral Oral Oral   SpO2: 96%  91% 91%  Weight: 81.2 kg       Intake/Output Summary (Last 24 hours) at 07/13/2022 1020 Last data filed at 07/13/2022 0830 Gross per 24 hour  Intake 180.69 ml  Output 850 ml  Net -669.31 ml   Filed Weights   07/13/22 0014  Weight: 81.2 kg    Examination:  General exam: Chronically ill female sitting up in bed, AAOx3 HEENT: Positive JVD CVS: S1-S2, regular rhythm Lungs: Decreased breath sounds at the bases Abdomen: Soft, nontender, bowel sounds present Extremities: No edema Skin: No rashes Psychiatry:  Mood & affect appropriate.     Data Reviewed:   CBC: Recent Labs  Lab 07/10/22 0345 07/10/22 0615 07/12/22 0535 07/12/22 0551 07/12/22 1745 07/13/22 0048  WBC 7.3 7.4 6.9  --  9.2 8.5  NEUTROABS  --  6.6 5.6  --   --   --   HGB 8.3* 8.7* 8.7* 8.8* 8.2* 7.4*  HCT 26.1* 26.8* 26.7* 26.0* 24.9*  23.6*  MCV 88.5 86.2 86.4  --  87.7 88.4  PLT 262 288 245  --  245 970   Basic Metabolic Panel: Recent Labs  Lab 07/10/22 0345 07/10/22 0525 07/12/22 0535 07/12/22 0551 07/12/22 1745 07/13/22 0048  NA 140 142 142 142  --  141  K 4.2 4.1 4.0 3.8  --  4.1  CL 111 108 109  --   --   108  CO2 21* 21* 21*  --   --  24  GLUCOSE 223* 258* 175*  --   --  205*  BUN 26* 26* 23*  --   --  22*  CREATININE 2.76* 2.75* 3.04*  --  3.15* 3.14*  CALCIUM 8.4* 8.6* 8.2*  --   --  7.9*   GFR: Estimated Creatinine Clearance: 24.5 mL/min (A) (by C-G formula based on SCr of 3.14 mg/dL (H)). Liver Function Tests: Recent Labs  Lab 07/10/22 0345 07/10/22 0525 07/12/22 0535 07/13/22 0048  AST 13* 11* 16 10*  ALT $Re'21 17 13 14  'eDT$ ALKPHOS 86 83 89 72  BILITOT 1.6* 1.5* 1.2 1.2  PROT 6.1* 6.3* 6.3* 5.0*  ALBUMIN 2.9* 3.3* 3.3* 2.3*   Recent Labs  Lab 07/10/22 0345 07/10/22 0525 07/12/22 0535  LIPASE 22 <10* <10*   No results for input(s): "AMMONIA" in the last 168 hours. Coagulation Profile: No results for input(s): "INR", "PROTIME" in the last 168 hours. Cardiac Enzymes: No results for input(s): "CKTOTAL", "CKMB", "CKMBINDEX", "TROPONINI" in the last 168 hours. BNP (last 3 results) No results for input(s): "PROBNP" in the last 8760 hours. HbA1C: No results for input(s): "HGBA1C" in the last 72 hours. CBG: Recent Labs  Lab 07/10/22 0527 07/12/22 0523 07/12/22 1744 07/12/22 2127 07/13/22 0627  GLUCAP 213* 158* 240* 176* 169*   Lipid Profile: No results for input(s): "CHOL", "HDL", "LDLCALC", "TRIG", "CHOLHDL", "LDLDIRECT" in the last 72 hours. Thyroid Function Tests: No results for input(s): "TSH", "T4TOTAL", "FREET4", "T3FREE", "THYROIDAB" in the last 72 hours. Anemia Panel: Recent Labs    07/13/22 0825  RETICCTPCT 2.6   Urine analysis:    Component Value Date/Time   COLORURINE YELLOW 07/10/2022 0410   APPEARANCEUR CLEAR 07/10/2022 0410   LABSPEC 1.015 07/10/2022 0410   PHURINE 7.0 07/10/2022 0410   GLUCOSEU >=500 (A) 07/10/2022 0410   HGBUR NEGATIVE 07/10/2022 0410   BILIRUBINUR NEGATIVE 07/10/2022 0410   BILIRUBINUR negative 08/30/2020 1631   KETONESUR NEGATIVE 07/10/2022 0410   PROTEINUR >=300 (A) 07/10/2022 0410   UROBILINOGEN 0.2 08/30/2020 1631    UROBILINOGEN 1.0 08/11/2008 1745   NITRITE NEGATIVE 07/10/2022 0410   LEUKOCYTESUR NEGATIVE 07/10/2022 0410   Sepsis Labs: $RemoveBefo'[email or web address]'[id number]$ (procalcitonin:4,lacticidven:4)  ) Recent Results (from the past 240 hour(s))  Resp Panel by RT-PCR (Flu A&B, Covid) Anterior Nasal Swab     Status: None   Collection Time: 07/12/22  5:42 AM   Specimen: Anterior Nasal Swab  Result Value Ref Range Status   SARS Coronavirus 2 by RT PCR NEGATIVE NEGATIVE Final    Comment: (NOTE) SARS-CoV-2 target nucleic acids are NOT DETECTED.  The SARS-CoV-2 RNA is generally detectable in upper respiratory specimens during the acute phase of infection. The lowest concentration of SARS-CoV-2 viral copies this assay can detect is 138 copies/mL. A negative result does not preclude SARS-Cov-2 infection and should not be used as the sole basis for treatment or other patient management decisions. A negative result may occur with  improper specimen collection/handling, submission of specimen other than nasopharyngeal swab, presence of  viral mutation(s) within the areas targeted by this assay, and inadequate number of viral copies(<138 copies/mL). A negative result must be combined with clinical observations, patient history, and epidemiological information. The expected result is Negative.  Fact Sheet for Patients:  EntrepreneurPulse.com.au  Fact Sheet for Healthcare Providers:  IncredibleEmployment.be  This test is no t yet approved or cleared by the [place] FDA and  has been authorized for detection and/or diagnosis of SARS-CoV-2 by FDA under an Emergency Use Authorization (EUA). This EUA will remain  in effect (meaning this test can be used) for the duration of the COVID-19 declaration under Section 564(b)(1) of the Act, 21 U.S.C.section 360bbb-3(b)(1), unless the authorization is terminated  or revoked sooner.       Influenza A by PCR NEGATIVE NEGATIVE Final   Influenza  B by PCR NEGATIVE NEGATIVE Final    Comment: (NOTE) The Xpert Xpress SARS-CoV-2/FLU/RSV plus assay is intended as an aid in the diagnosis of influenza from Nasopharyngeal swab specimens and should not be used as a sole basis for treatment. Nasal washings and aspirates are unacceptable for Xpert Xpress SARS-CoV-2/FLU/RSV testing.  Fact Sheet for Patients: EntrepreneurPulse.com.au  Fact Sheet for Healthcare Providers: IncredibleEmployment.be  This test is not yet approved or cleared by the [place] FDA and has been authorized for detection and/or diagnosis of SARS-CoV-2 by FDA under an Emergency Use Authorization (EUA). This EUA will remain in effect (meaning this test can be used) for the duration of the COVID-19 declaration under Section 564(b)(1) of the Act, 21 U.S.C. section 360bbb-3(b)(1), unless the authorization is terminated or revoked.  Performed at [facility], [address], [place], [place] [address]      Radiology Studies: NM Pulmonary Perfusion  Result Date: 07/12/2022 CLINICAL DATA:  Shortness of breath for 1 day, cough, history MI, asthma, CHF, clinically suspicious for pulmonary embolism EXAM: NUCLEAR MEDICINE PERFUSION LUNG SCAN TECHNIQUE: Perfusion images were obtained in multiple projections after intravenous injection of radiopharmaceutical. Ventilation scans intentionally deferred if perfusion scan and chest x-ray adequate for interpretation during COVID 19 epidemic. RADIOPHARMACEUTICALS:  4.0 mCi Tc-63m MAA IV COMPARISON:  Chest radiograph 07/12/2022 FINDINGS: Flattening of the posterior aspects of the lungs on lateral views, likely representing BILATERAL pleural effusions. Otherwise normal perfusion lung scan. No segmental or subsegmental perfusion defects identified to suggest pulmonary embolism. IMPRESSION: Pulmonary embolism absent. BILATERAL pleural effusions. Electronically Signed   By: Lavonia Dana  M.D.   On: 07/12/2022 12:32   DG Chest Port 1 View  Result Date: 07/12/2022 CLINICAL DATA:  Worsening shortness of breath EXAM: PORTABLE CHEST 1 VIEW COMPARISON:  Chest radiograph 07/12/2022 FINDINGS: Single lead AICD device overlies the left hemithorax. Monitoring leads overlie the patient. Stable cardiomegaly. Small bilateral pleural effusions and underlying consolidation. Pulmonary vascular redistribution. No pneumothorax. IMPRESSION: 1. Cardiomegaly with pulmonary vascular congestion. 2. Small bilateral pleural effusions with underlying consolidation. Electronically Signed   By: Lovey Newcomer M.D.   On: 07/12/2022 09:39   DG Chest Port 1 View  Result Date: 07/12/2022 CLINICAL DATA:  Cough and chest pain. EXAM: PORTABLE CHEST 1 VIEW COMPARISON:  06/19/2022 FINDINGS: 0624 hours. The cardio pericardial silhouette is enlarged. There is pulmonary vascular congestion without overt pulmonary edema. Bibasilar atelectasis or infiltrate noted with small bilateral pleural effusions. Left-sided AICD again noted. Telemetry leads overlie the chest. IMPRESSION: Enlargement of the cardiopericardial silhouette with bibasilar atelectasis or infiltrate and small bilateral pleural effusions. Electronically Signed   By: Misty Stanley M.D.   On: 07/12/2022 07:00  Scheduled Meds:  alum & mag hydroxide-simeth  30 mL Oral Once   feeding supplement  1 Container Oral TID BM   furosemide  60 mg Intravenous Q12H   heparin  5,000 Units Subcutaneous Q8H   insulin aspart  0-15 Units Subcutaneous TID WC   insulin aspart  0-5 Units Subcutaneous QHS   metoprolol tartrate  2.5 mg Intravenous Q6H   pneumococcal 20-valent conjugate vaccine  0.5 mL Intramuscular Tomorrow-1000   sodium chloride flush  3 mL Intravenous Q12H   Continuous Infusions:  sodium chloride     promethazine (PHENERGAN) injection (IM or IVPB) Stopped (07/12/22 1901)     LOS: 1 day    Time spent: 76min    Domenic Polite, MD [facility]   07/13/2022, 10:20 AM

## 2022-07-14 DIAGNOSIS — I5033 Acute on chronic diastolic (congestive) heart failure: Secondary | ICD-10-CM | POA: Diagnosis not present

## 2022-07-14 LAB — GLUCOSE, CAPILLARY
Glucose-Capillary: 76 mg/dL (ref 70–99)
Glucose-Capillary: 152 mg/dL — ABNORMAL HIGH (ref 70–99)
Glucose-Capillary: 197 mg/dL — ABNORMAL HIGH (ref 70–99)
Glucose-Capillary: 166 mg/dL — ABNORMAL HIGH (ref 70–99)

## 2022-07-14 LAB — CBC
HCT: 21.3 % — ABNORMAL LOW (ref 36.0–46.0)
Hemoglobin: 7 g/dL — ABNORMAL LOW (ref 12.0–15.0)
MCH: 28.2 pg (ref 26.0–34.0)
MCHC: 32.9 g/dL (ref 30.0–36.0)
MCV: 85.9 fL (ref 80.0–100.0)
Platelets: 224 10*3/uL (ref 150–400)
RBC: 2.48 MIL/uL — ABNORMAL LOW (ref 3.87–5.11)
RDW: 15.9 % — ABNORMAL HIGH (ref 11.5–15.5)
WBC: 5.3 10*3/uL (ref 4.0–10.5)
nRBC: 0 % (ref 0.0–0.2)

## 2022-07-14 LAB — BASIC METABOLIC PANEL
Anion gap: 8 (ref 5–15)
BUN: 22 mg/dL — ABNORMAL HIGH (ref 6–20)
CO2: 24 mmol/L (ref 22–32)
Calcium: 7.9 mg/dL — ABNORMAL LOW (ref 8.9–10.3)
Chloride: 107 mmol/L (ref 98–111)
Creatinine, Ser: 3.13 mg/dL — ABNORMAL HIGH (ref 0.44–1.00)
GFR, Estimated: 17 mL/min — ABNORMAL LOW (ref >60)
Glucose, Bld: 77 mg/dL (ref 70–99)
Potassium: 3.7 mmol/L (ref 3.5–5.1)
Sodium: 139 mmol/L (ref 135–145)

## 2022-07-14 MED ORDER — SODIUM CHLORIDE 0.9 % IV SOLN
510.0000 mg | Freq: Once | INTRAVENOUS | Status: AC
  Administered 2022-07-14: 510 mg via INTRAVENOUS
  Filled 2022-07-14: qty 17

## 2022-07-14 MED ORDER — FUROSEMIDE 10 MG/ML IJ SOLN
60.0000 mg | Freq: Two times a day (BID) | INTRAMUSCULAR | Status: AC
  Administered 2022-07-14 (×2): 60 mg via INTRAVENOUS
  Filled 2022-07-14 (×2): qty 6

## 2022-07-14 MED ORDER — INSULIN DETEMIR 100 UNIT/ML ~~LOC~~ SOLN
8.0000 [IU] | Freq: Every day | SUBCUTANEOUS | Status: DC
  Administered 2022-07-14 – 2022-07-17 (×4): 8 [IU] via SUBCUTANEOUS
  Filled 2022-07-14 (×4): qty 0.08

## 2022-07-14 MED ORDER — DARBEPOETIN ALFA 40 MCG/0.4ML IJ SOSY
40.0000 ug | PREFILLED_SYRINGE | INTRAMUSCULAR | Status: DC
  Administered 2022-07-15: 40 ug via SUBCUTANEOUS
  Filled 2022-07-14: qty 0.4

## 2022-07-14 NOTE — Inpatient Diabetes Management (Signed)
Inpatient Diabetes Program Recommendations  AACE/ADA: New Consensus Statement on Inpatient Glycemic Control   Target Ranges:  Prepandial:   less than 140 mg/dL      Peak postprandial:   less than 180 mg/dL (1-2 hours)      Critically ill patients:  140 - 180 mg/dL    Latest Reference Range & Units 07/13/22 06:27 07/13/22 11:45 07/13/22 16:30 07/13/22 21:15 07/13/22 22:06 07/14/22 06:22  Glucose-Capillary 70 - 99 mg/dL 169 (H) 195 (H) 186 (H) 43 (LL) 83 76    Review of Glycemic Control  Diabetes history: Type 1 DM (requires basal, carb coverage, and correction insulin) Outpatient Diabetes medications: OmniPod insulin pump with Novolog; Tresiba 50 units daily if not using pump; FreeStyle Libre3 CGM Current orders for Inpatient glycemic control: Levemir 10 units daily, Novolog 0-9 units TID with meals, Novolog 0-5 units QHS   Inpatient Diabetes Program Recommendations:    Insulin: Fasting CBG 76 mg/dl this morning. Please consider decreasing Levemir to 5 units daily and Novolog correction to 0-6 units TID with meals.  Thanks, Barnie Alderman, RN, MSN, [place] Diabetes Coordinator Inpatient Diabetes Program [phone] (Team Pager from 8am to [facility])

## 2022-07-14 NOTE — Progress Notes (Addendum)
PROGRESS NOTE    [name]  ITJ:[id number] DOB: 04-Jan-1971 DOA: 07/12/2022 PCP: [name], PA  50/F with history of type 1 diabetes mellitus, diabetic gastroparesis, recurrent admissions for nausea and vomiting, CKD 4, cardiac arrest 04/05/2022 (witnessed cardiac arrest at a restaurant, able AED advised shock, echo with preserved EF, mildly elevated PASP, underwent single-chamber ICD on 9/5. -Since then has been admitted multiple times with nausea and vomiting, seen by gastroenterology, scheduled for a gastric emptying scan on Tuesday. -Presented to the ED with cough, intermittent chest pain and nausea vomiting, chest x-ray noted pulmonary vascular congestion and small pleural effusions   Subjective: -Feels better today, wants to try to eat solid food, cough is improving  Assessment and Plan:  Acute on chronic diastolic CHF, chest pain Frequent chest discomfort following CPR few months ago -Clinically do not suspect ACS, VQ scan negative -Recent echo with preserved EF, no wall motion abnormalities -Clinically mildly volume overloaded with bilateral pleural effusions,  -Continue IV Lasix 1 more day, I/O inaccurate -GDMT limited by CKD 4 -Her situation is complicated by recurrent nausea and vomiting and gastroparesis  Recurrent nausea and vomiting -Presumed gastroparesis and recent gastritis, hiatal hernia -Seen by gastroenterology last 2 admissions, EGD 10/23 noted grade C reflux esophagitis, no bleeding, normal stomach and duodenum -Continue PPI, discontinued Reglan as she is scheduled for a gastric emptying scan this week, recommended small frequent meals, add boost, Phenergan as needed -Attempt solid food today -She needs to be seen at a tertiary center   History of aborted V-fib arrest -Status post Medtronic single-chamber ICD placed 9/23 -Echo with preserved EF  Type 1 diabetes mellitus -Off insulin pump, 1 episode of hypoglycemia early this morning, starting to  eat more now, decrease NovoLog dose, diabetes coordinator consult  CKD 4, baseline creatinine around 2.5-3 -Followed by nephrology, missed her recent appointment -Monitor with diuretics  Hypertensive urgency -Add oral hydralazine, diuretics as above  Chronic anemia -With worsening, anemia panel suggestive of chronic disease and mild iron defi, will add EPO -Transfuse if hemoglobin trends down further    DVT prophylaxis: Heparin subcutaneous Code Status: Full code Family Communication: Discussed with patient in detail, no family at bedside Disposition Plan: Home pending clinical improvement  Consultants:    Procedures:   Antimicrobials:    Objective: Vitals:   07/13/22 2026 07/14/22 0005 07/14/22 0502 07/14/22 0852  BP: (!) 134/90 (!) 149/85 (!) 144/85 (!) 148/85  Pulse: 73 74 76 78  Resp: $Remo'20 20 20 20  'TqECO$ Temp: 98.2 F (36.8 C) 97.6 F (36.4 C) 98.4 F (36.9 C) 97.9 F (36.6 C)  TempSrc: Oral Oral Oral Oral  SpO2: 97% 96%  98%  Weight:  81.1 kg      Intake/Output Summary (Last 24 hours) at 07/14/2022 1012 Last data filed at 07/14/2022 0800 Gross per 24 hour  Intake 195.9 ml  Output 1300 ml  Net -1104.1 ml   Filed Weights   07/13/22 0014 07/14/22 0005  Weight: 81.2 kg 81.1 kg    Examination:  General exam: Chronically ill female sitting up in bed, AAOx3 HEENT: Positive JVD CVS: S1-S2, regular rhythm Lungs: Decreased breath sounds to bases Abdomen: Soft, nontender, bowel sounds present Extremities: No edema Skin: No rashes Psychiatry:  Mood & affect appropriate.     Data Reviewed:   CBC: Recent Labs  Lab 07/10/22 0615 07/12/22 0535 07/12/22 0551 07/12/22 1745 07/13/22 0048 07/14/22 0041  WBC 7.4 6.9  --  9.2 8.5 5.3  NEUTROABS 6.6 5.6  --   --   --   --  HGB 8.7* 8.7* 8.8* 8.2* 7.4* 7.0*  HCT 26.8* 26.7* 26.0* 24.9* 23.6* 21.3*  MCV 86.2 86.4  --  87.7 88.4 85.9  PLT 288 245  --  245 253 480   Basic Metabolic Panel: Recent Labs  Lab  07/10/22 0345 07/10/22 0525 07/12/22 0535 07/12/22 0551 07/12/22 1745 07/13/22 0048 07/14/22 0041  NA 140 142 142 142  --  141 139  K 4.2 4.1 4.0 3.8  --  4.1 3.7  CL 111 108 109  --   --  108 107  CO2 21* 21* 21*  --   --  24 24  GLUCOSE 223* 258* 175*  --   --  205* 77  BUN 26* 26* 23*  --   --  22* 22*  CREATININE 2.76* 2.75* 3.04*  --  3.15* 3.14* 3.13*  CALCIUM 8.4* 8.6* 8.2*  --   --  7.9* 7.9*   GFR: Estimated Creatinine Clearance: 24.5 mL/min (A) (by C-G formula based on SCr of 3.13 mg/dL (H)). Liver Function Tests: Recent Labs  Lab 07/10/22 0345 07/10/22 0525 07/12/22 0535 07/13/22 0048  AST 13* 11* 16 10*  ALT $Re'21 17 13 14  'piZ$ ALKPHOS 86 83 89 72  BILITOT 1.6* 1.5* 1.2 1.2  PROT 6.1* 6.3* 6.3* 5.0*  ALBUMIN 2.9* 3.3* 3.3* 2.3*   Recent Labs  Lab 07/10/22 0345 07/10/22 0525 07/12/22 0535  LIPASE 22 <10* <10*   No results for input(s): "AMMONIA" in the last 168 hours. Coagulation Profile: No results for input(s): "INR", "PROTIME" in the last 168 hours. Cardiac Enzymes: No results for input(s): "CKTOTAL", "CKMB", "CKMBINDEX", "TROPONINI" in the last 168 hours. BNP (last 3 results) No results for input(s): "PROBNP" in the last 8760 hours. HbA1C: No results for input(s): "HGBA1C" in the last 72 hours. CBG: Recent Labs  Lab 07/13/22 1145 07/13/22 1630 07/13/22 2115 07/13/22 2206 07/14/22 0622  GLUCAP 195* 186* 43* 83 76   Lipid Profile: No results for input(s): "CHOL", "HDL", "LDLCALC", "TRIG", "CHOLHDL", "LDLDIRECT" in the last 72 hours. Thyroid Function Tests: No results for input(s): "TSH", "T4TOTAL", "FREET4", "T3FREE", "THYROIDAB" in the last 72 hours. Anemia Panel: Recent Labs    07/13/22 0825  VITAMINB12 378  FOLATE 8.6  FERRITIN 134  TIBC 221*  IRON 29  RETICCTPCT 2.6   Urine analysis:    Component Value Date/Time   COLORURINE YELLOW 07/10/2022 0410   APPEARANCEUR CLEAR 07/10/2022 0410   LABSPEC 1.015 07/10/2022 0410   PHURINE  7.0 07/10/2022 0410   GLUCOSEU >=500 (A) 07/10/2022 0410   HGBUR NEGATIVE 07/10/2022 0410   BILIRUBINUR NEGATIVE 07/10/2022 0410   BILIRUBINUR negative 08/30/2020 1631   KETONESUR NEGATIVE 07/10/2022 0410   PROTEINUR >=300 (A) 07/10/2022 0410   UROBILINOGEN 0.2 08/30/2020 1631   UROBILINOGEN 1.0 08/11/2008 1745   NITRITE NEGATIVE 07/10/2022 0410   LEUKOCYTESUR NEGATIVE 07/10/2022 0410   Sepsis Labs: $RemoveBefo'[email or web address]'[id number]$ (procalcitonin:4,lacticidven:4)  ) Recent Results (from the past 240 hour(s))  Resp Panel by RT-PCR (Flu A&B, Covid) Anterior Nasal Swab     Status: None   Collection Time: 07/12/22  5:42 AM   Specimen: Anterior Nasal Swab  Result Value Ref Range Status   SARS Coronavirus 2 by RT PCR NEGATIVE NEGATIVE Final    Comment: (NOTE) SARS-CoV-2 target nucleic acids are NOT DETECTED.  The SARS-CoV-2 RNA is generally detectable in upper respiratory specimens during the acute phase of infection. The lowest concentration of SARS-CoV-2 viral copies this assay can detect is 138 copies/mL. A negative result does  not preclude SARS-Cov-2 infection and should not be used as the sole basis for treatment or other patient management decisions. A negative result may occur with  improper specimen collection/handling, submission of specimen other than nasopharyngeal swab, presence of viral mutation(s) within the areas targeted by this assay, and inadequate number of viral copies(<138 copies/mL). A negative result must be combined with clinical observations, patient history, and epidemiological information. The expected result is Negative.  Fact Sheet for Patients:  EntrepreneurPulse.com.au  Fact Sheet for Healthcare Providers:  IncredibleEmployment.be  This test is no t yet approved or cleared by the [place] FDA and  has been authorized for detection and/or diagnosis of SARS-CoV-2 by FDA under an Emergency Use Authorization (EUA). This EUA will  remain  in effect (meaning this test can be used) for the duration of the COVID-19 declaration under Section 564(b)(1) of the Act, 21 U.S.C.section 360bbb-3(b)(1), unless the authorization is terminated  or revoked sooner.       Influenza A by PCR NEGATIVE NEGATIVE Final   Influenza B by PCR NEGATIVE NEGATIVE Final    Comment: (NOTE) The Xpert Xpress SARS-CoV-2/FLU/RSV plus assay is intended as an aid in the diagnosis of influenza from Nasopharyngeal swab specimens and should not be used as a sole basis for treatment. Nasal washings and aspirates are unacceptable for Xpert Xpress SARS-CoV-2/FLU/RSV testing.  Fact Sheet for Patients: EntrepreneurPulse.com.au  Fact Sheet for Healthcare Providers: IncredibleEmployment.be  This test is not yet approved or cleared by the [place] FDA and has been authorized for detection and/or diagnosis of SARS-CoV-2 by FDA under an Emergency Use Authorization (EUA). This EUA will remain in effect (meaning this test can be used) for the duration of the COVID-19 declaration under Section 564(b)(1) of the Act, 21 U.S.C. section 360bbb-3(b)(1), unless the authorization is terminated or revoked.  Performed at [facility], [address], [place], [place] [address]      Radiology Studies: NM Pulmonary Perfusion  Result Date: 07/12/2022 CLINICAL DATA:  Shortness of breath for 1 day, cough, history MI, asthma, CHF, clinically suspicious for pulmonary embolism EXAM: NUCLEAR MEDICINE PERFUSION LUNG SCAN TECHNIQUE: Perfusion images were obtained in multiple projections after intravenous injection of radiopharmaceutical. Ventilation scans intentionally deferred if perfusion scan and chest x-ray adequate for interpretation during COVID 19 epidemic. RADIOPHARMACEUTICALS:  4.0 mCi Tc-73m MAA IV COMPARISON:  Chest radiograph 07/12/2022 FINDINGS: Flattening of the posterior aspects of the lungs on  lateral views, likely representing BILATERAL pleural effusions. Otherwise normal perfusion lung scan. No segmental or subsegmental perfusion defects identified to suggest pulmonary embolism. IMPRESSION: Pulmonary embolism absent. BILATERAL pleural effusions. Electronically Signed   By: Lavonia Dana M.D.   On: 07/12/2022 12:32     Scheduled Meds:  alum & mag hydroxide-simeth  30 mL Oral Once   feeding supplement  1 Container Oral TID BM   furosemide  60 mg Intravenous Q12H   heparin  5,000 Units Subcutaneous Q8H   hydrALAZINE  25 mg Oral TID   insulin aspart  0-5 Units Subcutaneous QHS   insulin aspart  0-9 Units Subcutaneous TID WC   insulin detemir  8 Units Subcutaneous Daily   metoprolol tartrate  5 mg Intravenous Q6H   pantoprazole  40 mg Oral BID   pneumococcal 20-valent conjugate vaccine  0.5 mL Intramuscular Tomorrow-1000   sodium chloride flush  3 mL Intravenous Q12H   Continuous Infusions:  sodium chloride 5 mL/hr at 07/14/22 0643   promethazine (PHENERGAN) injection (IM or IVPB) Stopped (07/13/22 1045)  LOS: 2 days    Time spent: 62min    Domenic Polite, MD [facility]   07/14/2022, 10:12 AM

## 2022-07-14 NOTE — Plan of Care (Signed)
PT alert and oriented. In and out of bed to commode this morning. More nausea and fatigue this afternoon. PRN Ativan given for anxiety and nausea with good results. IV Metoprolol given twice.  Improved blood pressures noted. PT pleasant and able to make need known. Expressed stressors at home as a single parent with her own illnesses and illness of her youngest son. Uses call bell appropriately.   Problem: Education: Goal: Ability to describe self-care measures that may prevent or decrease complications (Diabetes Survival Skills Education) will improve Outcome: Progressing Goal: Individualized Educational Video(s) Outcome: Progressing   Problem: Coping: Goal: Ability to adjust to condition or change in health will improve Outcome: Progressing   Problem: Fluid Volume: Goal: Ability to maintain a balanced intake and output will improve Outcome: Progressing   Problem: Health Behavior/Discharge Planning: Goal: Ability to identify and utilize available resources and services will improve Outcome: Progressing Goal: Ability to manage health-related needs will improve Outcome: Progressing   Problem: Metabolic: Goal: Ability to maintain appropriate glucose levels will improve Outcome: Progressing   Problem: Nutritional: Goal: Maintenance of adequate nutrition will improve Outcome: Progressing Goal: Progress toward achieving an optimal weight will improve Outcome: Progressing   Problem: Skin Integrity: Goal: Risk for impaired skin integrity will decrease Outcome: Progressing   Problem: Tissue Perfusion: Goal: Adequacy of tissue perfusion will improve Outcome: Progressing   Problem: Education: Goal: Knowledge of General Education information will improve Description: Including pain rating scale, medication(s)/side effects and non-pharmacologic comfort measures Outcome: Progressing   Problem: Health Behavior/Discharge Planning: Goal: Ability to manage health-related needs will  improve Outcome: Progressing   Problem: Clinical Measurements: Goal: Ability to maintain clinical measurements within normal limits will improve Outcome: Progressing Goal: Will remain free from infection Outcome: Progressing Goal: Diagnostic test results will improve Outcome: Progressing Goal: Respiratory complications will improve Outcome: Progressing Goal: Cardiovascular complication will be avoided Outcome: Progressing   Problem: Activity: Goal: Risk for activity intolerance will decrease Outcome: Progressing   Problem: Nutrition: Goal: Adequate nutrition will be maintained Outcome: Progressing   Problem: Coping: Goal: Level of anxiety will decrease Outcome: Progressing   Problem: Elimination: Goal: Will not experience complications related to bowel motility Outcome: Progressing Goal: Will not experience complications related to urinary retention Outcome: Progressing   Problem: Pain Managment: Goal: General experience of comfort will improve Outcome: Progressing   Problem: Safety: Goal: Ability to remain free from injury will improve Outcome: Progressing   Problem: Skin Integrity: Goal: Risk for impaired skin integrity will decrease Outcome: Progressing

## 2022-07-15 ENCOUNTER — Encounter (HOSPITAL_COMMUNITY): Payer: Self-pay | Admitting: Family Medicine

## 2022-07-15 DIAGNOSIS — I5033 Acute on chronic diastolic (congestive) heart failure: Secondary | ICD-10-CM | POA: Diagnosis not present

## 2022-07-15 LAB — BASIC METABOLIC PANEL
Anion gap: 9 (ref 5–15)
BUN: 28 mg/dL — ABNORMAL HIGH (ref 6–20)
CO2: 24 mmol/L (ref 22–32)
Calcium: 7.1 mg/dL — ABNORMAL LOW (ref 8.9–10.3)
Chloride: 103 mmol/L (ref 98–111)
Creatinine, Ser: 3.73 mg/dL — ABNORMAL HIGH (ref 0.44–1.00)
GFR, Estimated: 14 mL/min — ABNORMAL LOW (ref >60)
Glucose, Bld: 233 mg/dL — ABNORMAL HIGH (ref 70–99)
Potassium: 3.4 mmol/L — ABNORMAL LOW (ref 3.5–5.1)
Sodium: 136 mmol/L (ref 135–145)

## 2022-07-15 LAB — CBC
HCT: 21.8 % — ABNORMAL LOW (ref 36.0–46.0)
Hemoglobin: 7.2 g/dL — ABNORMAL LOW (ref 12.0–15.0)
MCH: 28.6 pg (ref 26.0–34.0)
MCHC: 33 g/dL (ref 30.0–36.0)
MCV: 86.5 fL (ref 80.0–100.0)
Platelets: 234 10*3/uL (ref 150–400)
RBC: 2.52 MIL/uL — ABNORMAL LOW (ref 3.87–5.11)
RDW: 15.6 % — ABNORMAL HIGH (ref 11.5–15.5)
WBC: 6.2 10*3/uL (ref 4.0–10.5)
nRBC: 0 % (ref 0.0–0.2)

## 2022-07-15 LAB — GLUCOSE, CAPILLARY
Glucose-Capillary: 178 mg/dL — ABNORMAL HIGH (ref 70–99)
Glucose-Capillary: 194 mg/dL — ABNORMAL HIGH (ref 70–99)
Glucose-Capillary: 139 mg/dL — ABNORMAL HIGH (ref 70–99)
Glucose-Capillary: 158 mg/dL — ABNORMAL HIGH (ref 70–99)

## 2022-07-15 LAB — HEMOGLOBIN A1C
Hgb A1c MFr Bld: 6.4 % — ABNORMAL HIGH (ref 4.8–5.6)
Mean Plasma Glucose: 137 mg/dL

## 2022-07-15 LAB — PREPARE RBC (CROSSMATCH)

## 2022-07-15 MED ORDER — POTASSIUM CHLORIDE 20 MEQ PO PACK
40.0000 meq | PACK | Freq: Once | ORAL | Status: AC
  Administered 2022-07-15: 40 meq via ORAL
  Filled 2022-07-15: qty 2

## 2022-07-15 MED ORDER — ENSURE ENLIVE PO LIQD: 237.0000 mL | Freq: Two times a day (BID) | ORAL | Status: DC

## 2022-07-15 MED ORDER — METOPROLOL SUCCINATE ER 25 MG PO TB24
25.0000 mg | ORAL_TABLET | Freq: Every day | ORAL | Status: DC
  Administered 2022-07-16 – 2022-07-17 (×2): 25 mg via ORAL
  Filled 2022-07-15 (×2): qty 1

## 2022-07-15 MED ORDER — SODIUM CHLORIDE 0.9% IV SOLUTION: Freq: Once | INTRAVENOUS | Status: DC

## 2022-07-15 MED ORDER — POTASSIUM CHLORIDE CRYS ER 20 MEQ PO TBCR
40.0000 meq | EXTENDED_RELEASE_TABLET | Freq: Once | ORAL | Status: DC
  Filled 2022-07-15: qty 2

## 2022-07-15 MED ORDER — ADULT MULTIVITAMIN W/MINERALS CH
1.0000 | ORAL_TABLET | Freq: Every day | ORAL | Status: DC
  Filled 2022-07-15 (×3): qty 1

## 2022-07-15 MED ORDER — METOPROLOL TARTRATE 25 MG PO TABS: 25.0000 mg | ORAL_TABLET | Freq: Two times a day (BID) | ORAL | Status: DC

## 2022-07-15 NOTE — Progress Notes (Signed)
Initial Nutrition Assessment  DOCUMENTATION CODES:  Non-severe (moderate) malnutrition in context of chronic illness  INTERVENTION:  Continue current diet as ordered DC boost breeze, pt does not like. Adjust to Ensure Enlive po BID, each supplement provides 350 kcal and 20 grams of protein. MVI with minerals daily  NUTRITION DIAGNOSIS:  Moderate Malnutrition (in the context of chronic illness) related to nausea, vomiting as evidenced by moderate fat depletion, moderate muscle depletion.  GOAL:  Patient will meet greater than or equal to 90% of their needs  MONITOR:  PO intake, Supplement acceptance, Labs, Weight trends  REASON FOR ASSESSMENT:  Malnutrition Screening Tool    ASSESSMENT:  Pt with hx of type 1 DM with gastroparesis and CHF presented to ED with nausea and vomiting after recent admission to [facility] for similar presentation.  Noted pt has been followed by RD team at each of her admissions in the past. Has received gastroparesis education several times.  Pt resting in bed at the time of assessment. Just finished receiving a blood transfusion and RN flushing line.   Pt reports that she is feeling a little better today and was able to eat today. States that she was not able to tolerate diet after her most recent discharge. Reports she does not like boost breeze, but is ok drinking ensure enlive but that they sometimes  Average Meal Intake: 12/11: 100% intake x 1 recorded meals  Nutritionally Relevant Medications: Scheduled Meds:  Boost Breeze  1 Container Oral TID BM   insulin aspart  0-5 Units Subcutaneous QHS   insulin aspart  0-9 Units Subcutaneous TID WC   insulin detemir  8 Units Subcutaneous Daily   pantoprazole  40 mg Oral BID   PRN Meds: ondansetron, promethazine   Labs Reviewed: K 3.4 BUN 28, creatinine 3.73 CBG ranges from 76-194 mg/dL over the last 24 hours HgbA1c 6.4% (12/8)  NUTRITION - FOCUSED PHYSICAL EXAM: Flowsheet Row Most Recent Value   Orbital Region Moderate depletion  Upper Arm Region Severe depletion  Thoracic and Lumbar Region Moderate depletion  Buccal Region Moderate depletion  Temple Region Moderate depletion  Clavicle Bone Region Moderate depletion  Clavicle and Acromion Bone Region Moderate depletion  Scapular Bone Region Mild depletion  Dorsal Hand Severe depletion  Patellar Region Severe depletion  Anterior Thigh Region Severe depletion  Posterior Calf Region Severe depletion  Edema (RD Assessment) None  Hair Reviewed  Eyes Reviewed  Mouth Reviewed  Skin Reviewed  Nails Reviewed    Diet Order:   Diet Order             Diet Carb Modified Fluid consistency: Thin; Room service appropriate? Yes  Diet effective now                   EDUCATION NEEDS:  No education needs have been identified at this time  Skin:  Skin Assessment: Reviewed RN Assessment  Last BM:  12/11 - type 6  Height:  Ht Readings from Last 1 Encounters:  07/10/22 6' (1.829 m)    Weight:  Wt Readings from Last 1 Encounters:  07/14/22 81.1 kg    Ideal Body Weight:  72.7 kg  BMI:  Body mass index is 24.25 kg/m.  Estimated Nutritional Needs:  Kcal:  1800-2000 kcal/d Protein:  90-105 g/d Fluid:  >/=2L/d    Ranell Patrick, RD, LDN Clinical Dietitian RD pager # available in [facility]  After hours/weekend pager # available in [facility]

## 2022-07-15 NOTE — Progress Notes (Addendum)
   Heart Failure Stewardship Pharmacist Progress Note   PCP: [name], PA PCP-Cardiologist: Sinclair Grooms, MD    HPI:  51 yo F with PMH of T1DM, gastroparesis, CHF, cardiac arrest s/p ICD, and CKD IV.   Hospitalized from 9/1-04/11/22 with cardiac arrest. Received CPR and defib shock prior to arrival. ECHO was done 04/07/22 and LVEF was 50-55% with no regional wall motion abnormalities, mild LVH, G2DD, RV normal. ICD was placed on 04/09/22.   Has had several hospitalizations and ED visits for nausea and vomiting due to gastroparesis.   She presented the ED on 12/8 with chest pain and persistent nausea/vomiting (was seen in the ED just 2 days prior). CXR with pulmonary vascular congestion. BNP elevated.   Current HF Medications: Beta Blocker: metoprolol tartrate 5 mg IV q6h Other: hydralazine 25 mg TID; Feraheme 510 mg IV x 1 on 12/10  Prior to admission HF Medications: Diuretic: torsemide 20 mg daily PRN Beta blocker: metoprolol XL 25 mg daily  Pertinent Lab Values: Serum creatinine 3.73, BUN 28, Potassium 3.4, Sodium 136, BNP 1035.3, A1c 6.4   Vital Signs: Weight: 178 lbs (admission weight: 179 lbs) Blood pressure: 160/90s  Heart rate: 70-80s  I/O: -1.3L yesterday; net -2.5L  Medication Assistance / Insurance Benefits Check: Does the patient have prescription insurance?  Yes Type of insurance plan: [place]:  Prior to admission [facility] Is the patient willing to use [facility] at discharge? Yes Is the patient willing to transition their outpatient pharmacy to utilize a [facility] outpatient pharmacy?   Pending    Assessment: 1. Acute on chronic diastolic CHF (LVEF 61-22%), due to NICM. NYHA class II symptoms. - Holding further diuretics with bump in creatinine - On metoprolol tartrate 5 mg IV q6h, now tolerating food well. Consider transitioning to PO. - Continue hydralazine 25 mg TID, may need to  increase if BP remains elevated - GDMT limited by CKD IV - No SGLT2i with T1DM - Feraheme 510 mg IV x 1 given on 12/10, no cMRI x 3 months  Plan: 1) Medication changes recommended at this time: - Change metoprolol to PO - Increase hydralazine to 50 mg TID  2) Patient assistance: - None pending  3)  Education  - Patient has been educated on current HF medications and potential additions to HF medication regimen - Patient verbalizes understanding that over the next few months, these medication doses may change and more medications may be added to optimize HF regimen - Patient has been educated on basic disease state pathophysiology and goals of therapy   Kerby Nora, PharmD, BCPS Heart Failure Stewardship Pharmacist Phone [phone]

## 2022-07-15 NOTE — Progress Notes (Addendum)
PROGRESS NOTE    [name]  AXK:[id number] DOB: 1970/11/03 DOA: 07/12/2022 PCP: [name], PA  50/F with history of type 1 diabetes mellitus, diabetic gastroparesis, recurrent admissions for nausea and vomiting, CKD 4, cardiac arrest 04/05/2022 (witnessed cardiac arrest at a restaurant, able AED advised shock, echo with preserved EF, mildly elevated PASP, underwent single-chamber ICD on 9/5. -Since then has been admitted multiple times with nausea and vomiting, seen by gastroenterology, scheduled for a gastric emptying scan on Tuesday. -Presented to the ED with cough, intermittent chest pain and nausea vomiting, chest x-ray noted pulmonary vascular congestion and small pleural effusions   Subjective: -Feels better overall, eating small amounts, breathing better  Assessment and Plan:  Acute on chronic diastolic CHF, chest pain Frequent chest discomfort following CPR few months ago -Clinically do not suspect ACS, VQ scan negative -Recent echo with preserved EF, no wall motion abnormalities -Was volume overloaded on admission and bilateral effusions, dyspnea and cough on admission, diuresed gently with IV Lasix, 2.7 L negative, creatinine bumped today to 3.7, hold further diuretics today -GDMT limited by CKD 4 -Her situation is complicated by recurrent nausea and vomiting and gastroparesis  Recurrent nausea and vomiting -Presumed gastroparesis and recent gastritis, hiatal hernia -Seen by gastroenterology last 2 admissions, EGD 10/23 noted grade C reflux esophagitis, no bleeding, normal stomach and duodenum -Continue PPI, discontinued Reglan as she is scheduled for a gastric emptying scan this week, recommended small frequent meals, add boost, Phenergan as needed -Now tolerating solid food today -I called [facility], scheduled for gastric emptying scan for Thursday 12/14 at 9 AM -She needs to be seen at a tertiary center   History of aborted V-fib arrest -Status  post [employer] single-chamber ICD placed 9/23 -Echo with preserved EF  Type 1 diabetes mellitus -Off insulin pump, fluctuating blood sugars but overall more stable, continue low-dose Levemir, sliding scale -P.o. intake is unreliable with gastroparesis  AKI on CKD 4, baseline creatinine around 2.5-3 -Followed by nephrology, missed her recent appointment -Creatinine trending up, hold further diuretics, avoid hypotension -BMP in a.m.  Hypertensive urgency -Now stable, continue hydralazine  Chronic anemia -With worsening, anemia panel suggestive of chronic disease and mild iron defi, given EPO, IV iron 12/10 -Hemoglobin 7.2 this morning, denies overt bleeding, transfuse 1 unit of PRBC in the setting of cardiac disease and CKD 4   DVT prophylaxis: Heparin subcutaneous Code Status: Full code Family Communication: Discussed with patient in detail, no family at bedside Disposition Plan: Home pending clinical improvement  Consultants:    Procedures:   Antimicrobials:    Objective: Vitals:   07/14/22 1913 07/15/22 0015 07/15/22 0510 07/15/22 1049  BP: 126/71 (!) 146/87 131/75 (!) 163/93  Pulse: 80 75 75 82  Resp: $Remo'20 20 20 18  'a[place]uco$ Temp: 98.5 F (36.9 C) 98.5 F (36.9 C) 98.5 F (36.9 C) 97.8 F (36.6 C)  TempSrc: Oral Oral Oral Oral  SpO2: 93% 90%  92%  Weight:        Intake/Output Summary (Last 24 hours) at 07/15/2022 1135 Last data filed at 07/15/2022 0700 Gross per 24 hour  Intake 103.94 ml  Output 1800 ml  Net -1696.06 ml   Filed Weights   07/13/22 0014 07/14/22 0005  Weight: 81.2 kg 81.1 kg    Examination:  General exam: Pleasant chronically ill female sitting up in bed, AAOx3, no distress HEENT: No JVD CVS: S1-S2, regular rhythm Lungs: Clear bilaterally Abdomen: Soft, nontender, bowel sounds present Extremities: No edema Skin: No  rashes Psychiatry:  Mood & affect appropriate.     Data Reviewed:   CBC: Recent Labs  Lab 07/10/22 0615 07/12/22 0535  07/12/22 0551 07/12/22 1745 07/13/22 0048 07/14/22 0041 07/15/22 0047  WBC 7.4 6.9  --  9.2 8.5 5.3 6.2  NEUTROABS 6.6 5.6  --   --   --   --   --   HGB 8.7* 8.7* 8.8* 8.2* 7.4* 7.0* 7.2*  HCT 26.8* 26.7* 26.0* 24.9* 23.6* 21.3* 21.8*  MCV 86.2 86.4  --  87.7 88.4 85.9 86.5  PLT 288 245  --  245 253 224 628   Basic Metabolic Panel: Recent Labs  Lab 07/10/22 0525 07/12/22 0535 07/12/22 0551 07/12/22 1745 07/13/22 0048 07/14/22 0041 07/15/22 0047  NA 142 142 142  --  141 139 136  K 4.1 4.0 3.8  --  4.1 3.7 3.4*  CL 108 109  --   --  108 107 103  CO2 21* 21*  --   --  $R'24 24 24  'sm$ GLUCOSE 258* 175*  --   --  205* 77 233*  BUN 26* 23*  --   --  22* 22* 28*  CREATININE 2.75* 3.04*  --  3.15* 3.14* 3.13* 3.73*  CALCIUM 8.6* 8.2*  --   --  7.9* 7.9* 7.1*   GFR: Estimated Creatinine Clearance: 20.6 mL/min (A) (by C-G formula based on SCr of 3.73 mg/dL (H)). Liver Function Tests: Recent Labs  Lab 07/10/22 0345 07/10/22 0525 07/12/22 0535 07/13/22 0048  AST 13* 11* 16 10*  ALT $Re'21 17 13 14  'Bly$ ALKPHOS 86 83 89 72  BILITOT 1.6* 1.5* 1.2 1.2  PROT 6.1* 6.3* 6.3* 5.0*  ALBUMIN 2.9* 3.3* 3.3* 2.3*   Recent Labs  Lab 07/10/22 0345 07/10/22 0525 07/12/22 0535  LIPASE 22 <10* <10*   No results for input(s): "AMMONIA" in the last 168 hours. Coagulation Profile: No results for input(s): "INR", "PROTIME" in the last 168 hours. Cardiac Enzymes: No results for input(s): "CKTOTAL", "CKMB", "CKMBINDEX", "TROPONINI" in the last 168 hours. BNP (last 3 results) No results for input(s): "PROBNP" in the last 8760 hours. HbA1C: Recent Labs    07/12/22 1748  HGBA1C 6.4*   CBG: Recent Labs  Lab 07/14/22 0622 07/14/22 1127 07/14/22 1656 07/14/22 2115 07/15/22 0636  GLUCAP 76 152* 197* 166* 178*   Lipid Profile: No results for input(s): "CHOL", "HDL", "LDLCALC", "TRIG", "CHOLHDL", "LDLDIRECT" in the last 72 hours. Thyroid Function Tests: No results for input(s): "TSH",  "T4TOTAL", "FREET4", "T3FREE", "THYROIDAB" in the last 72 hours. Anemia Panel: Recent Labs    07/13/22 0825  VITAMINB12 378  FOLATE 8.6  FERRITIN 134  TIBC 221*  IRON 29  RETICCTPCT 2.6   Urine analysis:    Component Value Date/Time   COLORURINE YELLOW 07/10/2022 0410   APPEARANCEUR CLEAR 07/10/2022 0410   LABSPEC 1.015 07/10/2022 0410   PHURINE 7.0 07/10/2022 0410   GLUCOSEU >=500 (A) 07/10/2022 0410   HGBUR NEGATIVE 07/10/2022 0410   BILIRUBINUR NEGATIVE 07/10/2022 0410   BILIRUBINUR negative 08/30/2020 1631   KETONESUR NEGATIVE 07/10/2022 0410   PROTEINUR >=300 (A) 07/10/2022 0410   UROBILINOGEN 0.2 08/30/2020 1631   UROBILINOGEN 1.0 08/11/2008 1745   NITRITE NEGATIVE 07/10/2022 0410   LEUKOCYTESUR NEGATIVE 07/10/2022 0410   Sepsis Labs: $RemoveBefo'@[id number]'[id number]$ (procalcitonin:4,lacticidven:4)  ) Recent Results (from the past 240 hour(s))  Resp Panel by RT-PCR (Flu A&B, Covid) Anterior Nasal Swab     Status: None   Collection Time: 07/12/22  5:42  AM   Specimen: Anterior Nasal Swab  Result Value Ref Range Status   SARS Coronavirus 2 by RT PCR NEGATIVE NEGATIVE Final    Comment: (NOTE) SARS-CoV-2 target nucleic acids are NOT DETECTED.  The SARS-CoV-2 RNA is generally detectable in upper respiratory specimens during the acute phase of infection. The lowest concentration of SARS-CoV-2 viral copies this assay can detect is 138 copies/mL. A negative result does not preclude SARS-Cov-2 infection and should not be used as the sole basis for treatment or other patient management decisions. A negative result may occur with  improper specimen collection/handling, submission of specimen other than nasopharyngeal swab, presence of viral mutation(s) within the areas targeted by this assay, and inadequate number of viral copies(<138 copies/mL). A negative result must be combined with clinical observations, patient history, and epidemiological information. The expected result is  Negative.  Fact Sheet for Patients:  EntrepreneurPulse.com.au  Fact Sheet for Healthcare Providers:  IncredibleEmployment.be  This test is no t yet approved or cleared by the [place] FDA and  has been authorized for detection and/or diagnosis of SARS-CoV-2 by FDA under an Emergency Use Authorization (EUA). This EUA will remain  in effect (meaning this test can be used) for the duration of the COVID-19 declaration under Section 564(b)(1) of the Act, 21 U.S.C.section 360bbb-3(b)(1), unless the authorization is terminated  or revoked sooner.       Influenza A by PCR NEGATIVE NEGATIVE Final   Influenza B by PCR NEGATIVE NEGATIVE Final    Comment: (NOTE) The Xpert Xpress SARS-CoV-2/FLU/RSV plus assay is intended as an aid in the diagnosis of influenza from Nasopharyngeal swab specimens and should not be used as a sole basis for treatment. Nasal washings and aspirates are unacceptable for Xpert Xpress SARS-CoV-2/FLU/RSV testing.  Fact Sheet for Patients: EntrepreneurPulse.com.au  Fact Sheet for Healthcare Providers: IncredibleEmployment.be  This test is not yet approved or cleared by the [place] FDA and has been authorized for detection and/or diagnosis of SARS-CoV-2 by FDA under an Emergency Use Authorization (EUA). This EUA will remain in effect (meaning this test can be used) for the duration of the COVID-19 declaration under Section 564(b)(1) of the Act, 21 U.S.C. section 360bbb-3(b)(1), unless the authorization is terminated or revoked.  Performed at [facility], [address] [place], [place] [address]      Radiology Studies: No results found.   Scheduled Meds:  darbepoetin (ARANESP) injection - NON-DIALYSIS  40 mcg Subcutaneous Q Mon-1800   feeding supplement  1 Container Oral TID BM   heparin  5,000 Units Subcutaneous Q8H   hydrALAZINE  25 mg Oral TID    insulin aspart  0-5 Units Subcutaneous QHS   insulin aspart  0-9 Units Subcutaneous TID WC   insulin detemir  8 Units Subcutaneous Daily   metoprolol tartrate  5 mg Intravenous Q6H   pantoprazole  40 mg Oral BID   potassium chloride  40 mEq Oral Once   sodium chloride flush  3 mL Intravenous Q12H   Continuous Infusions:  sodium chloride Stopped (07/14/22 1454)   promethazine (PHENERGAN) injection (IM or IVPB) Stopped (07/13/22 1045)     LOS: 3 days    Time spent: 73min  Domenic Polite, MD [facility]   07/15/2022, 11:35 AM

## 2022-07-15 NOTE — TOC Progression Note (Signed)
Transition of Care [facility]) - Progression Note    Patient Details  Name: [name] MRN: [id number] Date of Birth: [birth year]  Transition of Care [facility]) CM/SW Contact  Zenon Mayo, RN Phone Number: 07/15/2022, 5:17 PM  Clinical Narrative:     from home, Gastroparesis, 2 liters [place], has ICD, hgb 7.2 for transfusion of 1 unit, wean oxygen, cret trending up, tomorrow plan for gastric emptying scan at [facility] on Thursday.  TOC following.        Expected Discharge Plan and Services                                                 Social Determinants of Health (SDOH) Interventions Alcohol Usage Interventions: Intervention Not Indicated (Score <7) Financial Strain Interventions: Intervention Not Indicated  Readmission Risk Interventions    06/21/2022    9:35 AM 04/26/2022   10:53 AM  Readmission Risk Prevention Plan  Transportation Screening Complete Complete  PCP or Specialist Appt within 5-7 Days  Complete  Home Care Screening  Complete  Medication Review (RN CM)  Complete  Medication Review (RN Care Manager) Complete   PCP or Specialist appointment within 3-5 days of discharge Complete   HRI or Home Care Consult Complete   SW Recovery Care/Counseling Consult Complete   [address] Complete

## 2022-07-15 NOTE — Plan of Care (Addendum)
Pt received 1uPRBC. New IV placed in right AC. Pt had BM x1.  Problem: Education: Goal: Ability to describe self-care measures that may prevent or decrease complications (Diabetes Survival Skills Education) will improve Outcome: Progressing Goal: Individualized Educational Video(s) Outcome: Progressing   Problem: Coping: Goal: Ability to adjust to condition or change in health will improve Outcome: Progressing   Problem: Fluid Volume: Goal: Ability to maintain a balanced intake and output will improve Outcome: Progressing   Problem: Health Behavior/Discharge Planning: Goal: Ability to identify and utilize available resources and services will improve Outcome: Progressing Goal: Ability to manage health-related needs will improve Outcome: Progressing   Problem: Metabolic: Goal: Ability to maintain appropriate glucose levels will improve Outcome: Progressing   Problem: Nutritional: Goal: Maintenance of adequate nutrition will improve Outcome: Progressing Goal: Progress toward achieving an optimal weight will improve Outcome: Progressing   Problem: Skin Integrity: Goal: Risk for impaired skin integrity will decrease Outcome: Progressing   Problem: Tissue Perfusion: Goal: Adequacy of tissue perfusion will improve Outcome: Progressing   Problem: Education: Goal: Knowledge of General Education information will improve Description: Including pain rating scale, medication(s)/side effects and non-pharmacologic comfort measures Outcome: Progressing   Problem: Health Behavior/Discharge Planning: Goal: Ability to manage health-related needs will improve Outcome: Progressing   Problem: Clinical Measurements: Goal: Ability to maintain clinical measurements within normal limits will improve Outcome: Progressing Goal: Will remain free from infection Outcome: Progressing Goal: Diagnostic test results will improve Outcome: Progressing Goal: Respiratory complications will  improve Outcome: Progressing Goal: Cardiovascular complication will be avoided Outcome: Progressing   Problem: Activity: Goal: Risk for activity intolerance will decrease Outcome: Progressing   Problem: Nutrition: Goal: Adequate nutrition will be maintained Outcome: Progressing   Problem: Coping: Goal: Level of anxiety will decrease Outcome: Progressing   Problem: Elimination: Goal: Will not experience complications related to bowel motility Outcome: Progressing Goal: Will not experience complications related to urinary retention Outcome: Progressing   Problem: Pain Managment: Goal: General experience of comfort will improve Outcome: Progressing   Problem: Safety: Goal: Ability to remain free from injury will improve Outcome: Progressing   Problem: Skin Integrity: Goal: Risk for impaired skin integrity will decrease Outcome: Progressing

## 2022-07-15 NOTE — Progress Notes (Incomplete)
Heart Failure Nurse Navigator Progress Note  PCP: [name], PA PCP-Cardiologist: *** Admission Diagnosis: *** Admitted from: ***  Presentation:   [name] presented with ***  ECHO/ LVEF: ***  Clinical Course:  Past Medical History:  Diagnosis Date   Anemia    Anxiety    Arthritis    Asthma    CHF (congestive heart failure) (HCC)    Depression    Diabetic ulcer of left foot ([place]) 05/12/2013   Gastroparesis    Generalized abdominal pain    History of chicken pox    Loss of weight 12/02/2019   Migraines    Mood disorder (HCC)    anxiety   Prurigo nodularis    with diabetic dermopathy   Type 1 diabetes, uncontrolled, with neuropathy    [place]   Ulcers of both lower legs ([place]) 02/20/2014     Social History   Socioeconomic History   Marital status: Divorced    Spouse name: Not on file   Number of children: 2   Years of education: Not on file   Highest education level: Not on file  Occupational History   Occupation: Freight forwarder  Tobacco Use   Smoking status: Never   Smokeless tobacco: Never  Vaping Use   Vaping Use: Never used  Substance and Sexual Activity   Alcohol use: Not Currently   Drug use: No   Sexual activity: Yes    Partners: Male    Birth control/protection: None  Other Topics Concern   Not on file  Social History Narrative   Caffeine use: daily   Occupation: cleans houses   Regular exercise: yes, active 5x/wk   Diet: good water, fruits/vegetables daily   Social Determinants of Health   Financial Resource Strain: Not on file  Food Insecurity: No Food Insecurity (07/12/2022)   Hunger Vital Sign    Worried About Running Out of Food in the Last Year: Never true    Ran Out of Food in the Last Year: Never true  Recent Concern: Food Insecurity - Food Insecurity Present (04/23/2022)   Hunger Vital Sign    Worried About Running Out of Food in the Last Year: Sometimes true    Ran Out of Food in the Last Year: Sometimes true   Transportation Needs: No Transportation Needs (07/12/2022)   PRAPARE - Hydrologist (Medical): No    Lack of Transportation (Non-Medical): No  Physical Activity: Not on file  Stress: Not on file  Social Connections: Not on file    High Risk Criteria for Readmission and/or Poor Patient Outcomes: Heart failure hospital admissions (last 6 months): ***  No Show rate: *** Difficult social situation: *** Demonstrates medication adherence: *** Primary Language: *** Literacy level: ***  Barriers of Care:   ***  Considerations/Referrals:   Referral made to Heart Failure Pharmacist Stewardship: *** Referral made to Heart Failure CSW/NCM TOC: *** Referral made to Heart & Vascular TOC clinic: ***  Items for Follow-up on DC/TOC: ***   ***

## 2022-07-16 ENCOUNTER — Encounter (HOSPITAL_COMMUNITY): Payer: Self-pay

## 2022-07-16 ENCOUNTER — Inpatient Hospital Stay (HOSPITAL_COMMUNITY): Admission: RE | Admit: 2022-07-16 | Payer: BC Managed Care – PPO | Source: Ambulatory Visit

## 2022-07-16 ENCOUNTER — Ambulatory Visit (HOSPITAL_COMMUNITY): Admission: RE | Admit: 2022-07-16 | Payer: BC Managed Care – PPO | Source: Ambulatory Visit

## 2022-07-16 DIAGNOSIS — E440 Moderate protein-calorie malnutrition: Secondary | ICD-10-CM | POA: Insufficient documentation

## 2022-07-16 DIAGNOSIS — I5033 Acute on chronic diastolic (congestive) heart failure: Secondary | ICD-10-CM | POA: Diagnosis not present

## 2022-07-16 LAB — BASIC METABOLIC PANEL
Anion gap: 9 (ref 5–15)
BUN: 37 mg/dL — ABNORMAL HIGH (ref 6–20)
CO2: 23 mmol/L (ref 22–32)
Calcium: 7.8 mg/dL — ABNORMAL LOW (ref 8.9–10.3)
Chloride: 106 mmol/L (ref 98–111)
Creatinine, Ser: 3.15 mg/dL — ABNORMAL HIGH (ref 0.44–1.00)
GFR, Estimated: 17 mL/min — ABNORMAL LOW (ref >60)
Glucose, Bld: 169 mg/dL — ABNORMAL HIGH (ref 70–99)
Potassium: 3.7 mmol/L (ref 3.5–5.1)
Sodium: 138 mmol/L (ref 135–145)

## 2022-07-16 LAB — CBC
HCT: 27.2 % — ABNORMAL LOW (ref 36.0–46.0)
Hemoglobin: 9 g/dL — ABNORMAL LOW (ref 12.0–15.0)
MCH: 28.2 pg (ref 26.0–34.0)
MCHC: 33.1 g/dL (ref 30.0–36.0)
MCV: 85.3 fL (ref 80.0–100.0)
Platelets: 269 10*3/uL (ref 150–400)
RBC: 3.19 MIL/uL — ABNORMAL LOW (ref 3.87–5.11)
RDW: 14.9 % (ref 11.5–15.5)
WBC: 7 10*3/uL (ref 4.0–10.5)
nRBC: 0 % (ref 0.0–0.2)

## 2022-07-16 LAB — GLUCOSE, CAPILLARY
Glucose-Capillary: 178 mg/dL — ABNORMAL HIGH (ref 70–99)
Glucose-Capillary: 226 mg/dL — ABNORMAL HIGH (ref 70–99)
Glucose-Capillary: 133 mg/dL — ABNORMAL HIGH (ref 70–99)
Glucose-Capillary: 214 mg/dL — ABNORMAL HIGH (ref 70–99)

## 2022-07-16 LAB — BPAM RBC
Blood Product Expiration Date: 202401092359
ISSUE DATE / TIME: 202312111043
Unit Type and Rh: 6200

## 2022-07-16 LAB — TYPE AND SCREEN
ABO/RH(D): A POS
Antibody Screen: NEGATIVE
Unit division: 0

## 2022-07-16 MED ORDER — HYDRALAZINE HCL 50 MG PO TABS
50.0000 mg | ORAL_TABLET | Freq: Three times a day (TID) | ORAL | Status: DC
  Administered 2022-07-16 – 2022-07-17 (×4): 50 mg via ORAL
  Filled 2022-07-16 (×4): qty 1

## 2022-07-16 MED ORDER — SCOPOLAMINE 1 MG/3DAYS TD PT72
1.0000 | MEDICATED_PATCH | TRANSDERMAL | Status: DC
  Administered 2022-07-16: 1.5 mg via TRANSDERMAL
  Filled 2022-07-16: qty 1

## 2022-07-16 NOTE — Progress Notes (Signed)
PROGRESS NOTE    [name]  EOF:[id number] DOB: March 09, 1971 DOA: 07/12/2022 PCP: [name], PA  50/F with history of type 1 diabetes mellitus, diabetic gastroparesis, recurrent admissions for nausea and vomiting, CKD 4, cardiac arrest 04/05/2022 (witnessed cardiac arrest at a restaurant, able AED advised shock, echo with preserved EF, mildly elevated PASP, underwent single-chamber ICD on 9/5. -Since then has been admitted multiple times with nausea and vomiting, seen by gastroenterology, scheduled for a gastric emptying scan on Tuesday. -Presented to the ED with cough, intermittent chest pain and nausea vomiting, chest x-ray noted pulmonary vascular congestion and small pleural effusions   Subjective: -Vomited large amount last night, nauseated this morning  Assessment and Plan:  Acute on chronic diastolic CHF, chest pain Frequent chest discomfort following CPR few months ago -Clinically do not suspect ACS, VQ scan negative -Recent echo with preserved EF, no wall motion abnormalities -Was volume overloaded on admission and bilateral effusions, dyspnea and cough on admission, diuresed gently with IV Lasix, 2.7 L negative, creatinine stabilizing, hold diuretics today as p.o. intake is poor with recurrence of nausea/vomiting -GDMT limited by CKD 4 -Her situation is complicated by recurrent nausea and vomiting and gastroparesis  Recurrent nausea and vomiting -Presumed gastroparesis and recent gastritis, hiatal hernia -Seen by gastroenterology last 2 admissions, EGD 10/23 noted grade C reflux esophagitis, no bleeding, normal stomach and duodenum -Continue PPI, discontinued Reglan as she is scheduled for a gastric emptying scan this week, recommended small frequent meals, add boost, Phenergan PRN -Unfortunately had recurrence of symptoms last night -Continue supportive care -I called  radiology, rescheduled for gastric emptying scan for Thursday 12/14 at 9 AM -She needs  to be seen at a tertiary center   History of aborted V-fib arrest -Status post [employer] single-chamber ICD placed 9/23 -Echo with preserved EF  Type 1 diabetes mellitus -Off insulin pump, fluctuating blood sugars but overall more stable, continue low-dose Levemir, sliding scale -P.o. intake is unreliable with gastroparesis  AKI on CKD 4, baseline creatinine around 2.5-3 -Followed by nephrology, missed her recent appointment -Creatinine stabilizing, avoid hypotension, resume diuretics tomorrow  Hypertensive urgency -Blood pressure up last night, likely vomited p.m. meds, increase hydralazine dose  Chronic anemia -With worsening, anemia panel suggestive of chronic disease and mild iron defi, given EPO, IV iron 12/10 -Transfused 1 unit PRBC yesterday   DVT prophylaxis: Heparin subcutaneous Code Status: Full code Family Communication: Discussed with patient in detail, no family at bedside Disposition Plan: Home pending clinical improvement, hopefully tomorrow  Consultants:    Procedures:   Antimicrobials:    Objective: Vitals:   07/15/22 1500 07/15/22 2008 07/16/22 0020 07/16/22 0535  BP: (!) 167/103 (!) 172/93 (!) 152/83 (!) 192/110  Pulse: 83 84 77 96  Resp: $Remo'16 18 18 20  'OKjhZ$ Temp: 98.4 F (36.9 C) 98.8 F (37.1 C)  98.1 F (36.7 C)  TempSrc: Oral Oral  Oral  SpO2: 98% 94% 94% 90%  Weight:        Intake/Output Summary (Last 24 hours) at 07/16/2022 1130 Last data filed at 07/16/2022 0459 Gross per 24 hour  Intake 1400 ml  Output 1570 ml  Net -170 ml   Filed Weights   07/13/22 0014 07/14/22 0005  Weight: 81.2 kg 81.1 kg    Examination:  General exam: Pleasant chronically ill female sitting up in bed, AAOx3, no distress HEENT: No JVD CVS: S1-S2, regular rhythm Lungs: Decreased breath sounds at the bases Abdomen: Soft, nontender, bowel sounds present Extremities: No edema  Skin: No rashes Psychiatry:  Mood & affect appropriate.     Data Reviewed:    CBC: Recent Labs  Lab 07/10/22 0615 07/12/22 0535 07/12/22 0551 07/12/22 1745 07/13/22 0048 07/14/22 0041 07/15/22 0047 07/16/22 0034  WBC 7.4 6.9  --  9.2 8.5 5.3 6.2 7.0  NEUTROABS 6.6 5.6  --   --   --   --   --   --   HGB 8.7* 8.7*   < > 8.2* 7.4* 7.0* 7.2* 9.0*  HCT 26.8* 26.7*   < > 24.9* 23.6* 21.3* 21.8* 27.2*  MCV 86.2 86.4  --  87.7 88.4 85.9 86.5 85.3  PLT 288 245  --  245 253 224 234 269   < > = values in this interval not displayed.   Basic Metabolic Panel: Recent Labs  Lab 07/12/22 0535 07/12/22 0551 07/12/22 1745 07/13/22 0048 07/14/22 0041 07/15/22 0047 07/16/22 0034  NA 142 142  --  141 139 136 138  K 4.0 3.8  --  4.1 3.7 3.4* 3.7  CL 109  --   --  108 107 103 106  CO2 21*  --   --  $R'24 24 24 23  'tg$ GLUCOSE 175*  --   --  205* 77 233* 169*  BUN 23*  --   --  22* 22* 28* 37*  CREATININE 3.04*  --  3.15* 3.14* 3.13* 3.73* 3.15*  CALCIUM 8.2*  --   --  7.9* 7.9* 7.1* 7.8*   GFR: Estimated Creatinine Clearance: 24.4 mL/min (A) (by C-G formula based on SCr of 3.15 mg/dL (H)). Liver Function Tests: Recent Labs  Lab 07/10/22 0345 07/10/22 0525 07/12/22 0535 07/13/22 0048  AST 13* 11* 16 10*  ALT $Re'21 17 13 14  'wsm$ ALKPHOS 86 83 89 72  BILITOT 1.6* 1.5* 1.2 1.2  PROT 6.1* 6.3* 6.3* 5.0*  ALBUMIN 2.9* 3.3* 3.3* 2.3*   Recent Labs  Lab 07/10/22 0345 07/10/22 0525 07/12/22 0535  LIPASE 22 <10* <10*   No results for input(s): "AMMONIA" in the last 168 hours. Coagulation Profile: No results for input(s): "INR", "PROTIME" in the last 168 hours. Cardiac Enzymes: No results for input(s): "CKTOTAL", "CKMB", "CKMBINDEX", "TROPONINI" in the last 168 hours. BNP (last 3 results) No results for input(s): "PROBNP" in the last 8760 hours. HbA1C: No results for input(s): "HGBA1C" in the last 72 hours.  CBG: Recent Labs  Lab 07/15/22 0636 07/15/22 1155 07/15/22 1713 07/15/22 2132 07/16/22 0625  GLUCAP 178* 194* 139* 158* 178*   Lipid Profile: No  results for input(s): "CHOL", "HDL", "LDLCALC", "TRIG", "CHOLHDL", "LDLDIRECT" in the last 72 hours. Thyroid Function Tests: No results for input(s): "TSH", "T4TOTAL", "FREET4", "T3FREE", "THYROIDAB" in the last 72 hours. Anemia Panel: No results for input(s): "VITAMINB12", "FOLATE", "FERRITIN", "TIBC", "IRON", "RETICCTPCT" in the last 72 hours.  Urine analysis:    Component Value Date/Time   COLORURINE YELLOW 07/10/2022 0410   APPEARANCEUR CLEAR 07/10/2022 0410   LABSPEC 1.015 07/10/2022 0410   PHURINE 7.0 07/10/2022 0410   GLUCOSEU >=500 (A) 07/10/2022 0410   HGBUR NEGATIVE 07/10/2022 0410   BILIRUBINUR NEGATIVE 07/10/2022 0410   BILIRUBINUR negative 08/30/2020 1631   KETONESUR NEGATIVE 07/10/2022 0410   PROTEINUR >=300 (A) 07/10/2022 0410   UROBILINOGEN 0.2 08/30/2020 1631   UROBILINOGEN 1.0 08/11/2008 1745   NITRITE NEGATIVE 07/10/2022 0410   LEUKOCYTESUR NEGATIVE 07/10/2022 0410   Sepsis Labs: $RemoveBefo'@[id number]'GiDpBMilNHc$ (procalcitonin:4,lacticidven:4)  ) Recent Results (from the past 240 hour(s))  Resp Panel by RT-PCR (Flu A&B,  Covid) Anterior Nasal Swab     Status: None   Collection Time: 07/12/22  5:42 AM   Specimen: Anterior Nasal Swab  Result Value Ref Range Status   SARS Coronavirus 2 by RT PCR NEGATIVE NEGATIVE Final    Comment: (NOTE) SARS-CoV-2 target nucleic acids are NOT DETECTED.  The SARS-CoV-2 RNA is generally detectable in upper respiratory specimens during the acute phase of infection. The lowest concentration of SARS-CoV-2 viral copies this assay can detect is 138 copies/mL. A negative result does not preclude SARS-Cov-2 infection and should not be used as the sole basis for treatment or other patient management decisions. A negative result may occur with  improper specimen collection/handling, submission of specimen other than nasopharyngeal swab, presence of viral mutation(s) within the areas targeted by this assay, and inadequate number of viral copies(<138  copies/mL). A negative result must be combined with clinical observations, patient history, and epidemiological information. The expected result is Negative.  Fact Sheet for Patients:  EntrepreneurPulse.com.au  Fact Sheet for Healthcare Providers:  IncredibleEmployment.be  This test is no t yet approved or cleared by the [place] FDA and  has been authorized for detection and/or diagnosis of SARS-CoV-2 by FDA under an Emergency Use Authorization (EUA). This EUA will remain  in effect (meaning this test can be used) for the duration of the COVID-19 declaration under Section 564(b)(1) of the Act, 21 U.S.C.section 360bbb-3(b)(1), unless the authorization is terminated  or revoked sooner.       Influenza A by PCR NEGATIVE NEGATIVE Final   Influenza B by PCR NEGATIVE NEGATIVE Final    Comment: (NOTE) The Xpert Xpress SARS-CoV-2/FLU/RSV plus assay is intended as an aid in the diagnosis of influenza from Nasopharyngeal swab specimens and should not be used as a sole basis for treatment. Nasal washings and aspirates are unacceptable for Xpert Xpress SARS-CoV-2/FLU/RSV testing.  Fact Sheet for Patients: EntrepreneurPulse.com.au  Fact Sheet for Healthcare Providers: IncredibleEmployment.be  This test is not yet approved or cleared by the [place] FDA and has been authorized for detection and/or diagnosis of SARS-CoV-2 by FDA under an Emergency Use Authorization (EUA). This EUA will remain in effect (meaning this test can be used) for the duration of the COVID-19 declaration under Section 564(b)(1) of the Act, 21 U.S.C. section 360bbb-3(b)(1), unless the authorization is terminated or revoked.  Performed at [facility], [address], [place], [place] [address]      Radiology Studies: No results found.   Scheduled Meds:  darbepoetin (ARANESP) injection - NON-DIALYSIS  40 mcg  Subcutaneous Q Mon-1800   feeding supplement  237 mL Oral BID BM   heparin  5,000 Units Subcutaneous Q8H   hydrALAZINE  50 mg Oral TID   insulin aspart  0-5 Units Subcutaneous QHS   insulin aspart  0-9 Units Subcutaneous TID WC   insulin detemir  8 Units Subcutaneous Daily   metoprolol succinate  25 mg Oral Daily   multivitamin with minerals  1 tablet Oral Daily   pantoprazole  40 mg Oral BID   sodium chloride flush  3 mL Intravenous Q12H   Continuous Infusions:  sodium chloride Stopped (07/14/22 1454)   promethazine (PHENERGAN) injection (IM or IVPB) 12.5 mg (07/16/22 0459)     LOS: 4 days    Time spent: 8min  Domenic Polite, MD [facility]   07/16/2022, 11:30 AM

## 2022-07-16 NOTE — Progress Notes (Signed)
   Heart Failure Stewardship Pharmacist Progress Note   PCP: [name], PA PCP-Cardiologist: Sinclair Grooms, MD    HPI:  51 yo F with PMH of T1DM, gastroparesis, CHF, cardiac arrest s/p ICD, and CKD IV.   Hospitalized from 9/1-04/11/22 with cardiac arrest. Received CPR and defib shock prior to arrival. ECHO was done 04/07/22 and LVEF was 50-55% with no regional wall motion abnormalities, mild LVH, G2DD, RV normal. ICD was placed on 04/09/22.   Has had several hospitalizations and ED visits for nausea and vomiting due to gastroparesis.   She presented the ED on 12/8 with chest pain and persistent nausea/vomiting (was seen in the ED just 2 days prior). CXR with pulmonary vascular congestion. BNP elevated.   Current HF Medications: Beta Blocker: metoprolol XL 25 mg daily Other: hydralazine 50 mg TID; Feraheme 510 mg IV x 1 on 12/10  Prior to admission HF Medications: Diuretic: torsemide 20 mg daily PRN Beta blocker: metoprolol XL 25 mg daily  Pertinent Lab Values: Serum creatinine 3.15, BUN 37, Potassium 3.7, Sodium 138, BNP 1035.3, A1c 6.4   Vital Signs: Weight: 178 lbs (admission weight: 179 lbs) Blood pressure: 150-190/90s  Heart rate: 70-80s  I/O: -0.8L yesterday; net -2.7L  Medication Assistance / Insurance Benefits Check: Does the patient have prescription insurance?  Yes Type of insurance plan: [place]:  [facility] Is the patient willing to use [facility] pharmacy at discharge? Yes Is the patient willing to transition their outpatient pharmacy to utilize a [facility] outpatient pharmacy?   Pending    Assessment: 1. Acute on chronic diastolic CHF (LVEF 73-56%), due to NICM. NYHA class II symptoms. - Held diuretics with creatinine bump yesterday - Continue metoprolol XL 25 mg daily - Agree with increasing to hydralazine 50 mg TID - GDMT limited by CKD IV - No SGLT2i with T1DM - Feraheme 510  mg IV x 1 given on 12/10, no cMRI x 3 months  Plan: 1) Medication changes recommended at this time: - Agree with changes  2) Patient assistance: - None pending  3)  Education  - Patient has been educated on current HF medications and potential additions to HF medication regimen - Patient verbalizes understanding that over the next few months, these medication doses may change and more medications may be added to optimize HF regimen - Patient has been educated on basic disease state pathophysiology and goals of therapy   Kerby Nora, PharmD, BCPS Heart Failure Stewardship Pharmacist Phone [phone]

## 2022-07-17 ENCOUNTER — Other Ambulatory Visit (HOSPITAL_COMMUNITY): Payer: Self-pay

## 2022-07-17 DIAGNOSIS — I5033 Acute on chronic diastolic (congestive) heart failure: Secondary | ICD-10-CM | POA: Diagnosis not present

## 2022-07-17 LAB — CBC
HCT: 28.8 % — ABNORMAL LOW (ref 36.0–46.0)
Hemoglobin: 9.5 g/dL — ABNORMAL LOW (ref 12.0–15.0)
MCH: 28.3 pg (ref 26.0–34.0)
MCHC: 33 g/dL (ref 30.0–36.0)
MCV: 85.7 fL (ref 80.0–100.0)
Platelets: 265 10*3/uL (ref 150–400)
RBC: 3.36 MIL/uL — ABNORMAL LOW (ref 3.87–5.11)
RDW: 15.3 % (ref 11.5–15.5)
WBC: 5.8 10*3/uL (ref 4.0–10.5)
nRBC: 0 % (ref 0.0–0.2)

## 2022-07-17 LAB — GLUCOSE, CAPILLARY
Glucose-Capillary: 223 mg/dL — ABNORMAL HIGH (ref 70–99)
Glucose-Capillary: 230 mg/dL — ABNORMAL HIGH (ref 70–99)

## 2022-07-17 LAB — BASIC METABOLIC PANEL
Anion gap: 8 (ref 5–15)
BUN: 32 mg/dL — ABNORMAL HIGH (ref 6–20)
CO2: 23 mmol/L (ref 22–32)
Calcium: 7.9 mg/dL — ABNORMAL LOW (ref 8.9–10.3)
Chloride: 108 mmol/L (ref 98–111)
Creatinine, Ser: 3.32 mg/dL — ABNORMAL HIGH (ref 0.44–1.00)
GFR, Estimated: 16 mL/min — ABNORMAL LOW (ref >60)
Glucose, Bld: 126 mg/dL — ABNORMAL HIGH (ref 70–99)
Potassium: 4 mmol/L (ref 3.5–5.1)
Sodium: 139 mmol/L (ref 135–145)

## 2022-07-17 MED ORDER — METOCLOPRAMIDE HCL 10 MG PO TABS
10.0000 mg | ORAL_TABLET | Freq: Three times a day (TID) | ORAL | 0 refills | Status: DC
  Filled 2022-07-17: qty 90, 30d supply, fill #0

## 2022-07-17 MED ORDER — LORAZEPAM 0.5 MG PO TABS
0.5000 mg | ORAL_TABLET | Freq: Three times a day (TID) | ORAL | 0 refills | Status: DC | PRN
  Filled 2022-07-17: qty 30, 10d supply, fill #0

## 2022-07-17 MED ORDER — HYDRALAZINE HCL 50 MG PO TABS
50.0000 mg | ORAL_TABLET | Freq: Three times a day (TID) | ORAL | 1 refills | Status: DC
  Filled 2022-07-17: qty 90, 30d supply, fill #0

## 2022-07-17 MED ORDER — PROMETHAZINE HCL 25 MG RE SUPP
25.0000 mg | Freq: Four times a day (QID) | RECTAL | 2 refills | Status: DC | PRN
  Filled 2022-07-17: qty 12, 3d supply, fill #0

## 2022-07-17 NOTE — Discharge Summary (Signed)
Physician Discharge Summary  [name] TYO:[id number] DOB: [birth year] DOA: 07/12/2022  PCP: [name], PA  Admit date: 07/12/2022 Discharge date: 07/17/2022  Time spent: 45 minutes  Recommendations for Outpatient Follow-up:  [facility] gastroenterology next week to follow-up on gastric emptying study done tomorrow at [facility] long radiology Nephrology Dr. 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 -Since then has been admitted multiple times with nausea and vomiting, seen by gastroenterology, scheduled for a gastric emptying scan on Tuesday. -Presented to the ED with cough, intermittent chest pain and nausea vomiting, chest x-ray noted pulmonary vascular congestion and small pleural effusions  Hospital Course:   Acute on chronic diastolic CHF, chest pain Frequent chest discomfort following CPR few months ago -ACS ruled out, VQ scan negative -Recent echo with preserved EF, no wall motion abnormalities -Was volume overloaded on admission and bilateral effusions, dyspnea and cough on admission, diuresed gently with IV Lasix, 4.3 L negative, creatinine stabilizing, does not take daily diuretics  on account of recurrent nausea and vomiting, advised to resume torsemide as needed for weight gain, swelling etc -She is followed by nephrology for CKD 4 -GDMT limited by CKD 4 -Her situation is complicated by recurrent nausea and vomiting and gastroparesis   Recurrent nausea and vomiting -Presumed gastroparesis and recent gastritis, hiatal hernia -Seen by gastroenterology last 2 admissions, EGD 10/23 noted grade C reflux esophagitis, no bleeding, normal stomach and duodenum -Continue PPI, discontinued Reglan as she is scheduled for a gastric emptying scan soon, recommended small frequent meals, add boost, Phenergan PRN -Symptoms improved with abortive care -She missed her appointment, I called [facility] radiology, rescheduled for gastric emptying scan for Thursday 12/14 at 9 AM -Advised to resume Reglan after her gastric emptying scan tomorrow, close follow-up with [facility] next week and then needs referral to a tertiary care center   History of aborted V-fib arrest -Status post [employer] single-chamber ICD placed 9/23 -Echo with preserved EF   Type 1 diabetes mellitus -Off insulin pump, Levemir use while inpatient, insulin pump to be resumed at discharge -P.o. intake is unreliable with gastroparesis   AKI on CKD 4, baseline creatinine around 2.5-3 -Followed by nephrology, missed her recent appointment -Creatinine stabilizing,   Hypertensive urgency -Continued on metoprolol, started hydralazine this admission   Chronic anemia -With worsening, anemia panel suggestive of chronic disease and mild iron defi, given EPO, IV iron 12/10 -Transfused 1 unit PRBC this admission, hemoglobin stable at discharge      Discharge Exam: Vitals:   07/17/22 1022 07/17/22 1152  BP: (!) 155/92 (!) 161/98  Pulse: 83 83  Resp: 18 18  Temp: 98.1 F (36.7 C) 98.1 F (36.7 C)  SpO2: 95% 100%   Gen: Awake, Alert, Oriented X 3,  HEENT: no  JVD Lungs: Good air movement bilaterally, CTAB CVS:  S1S2/RRR Abd: soft, Non tender, non distended, BS present Extremities: No edema Skin: no new rashes on exposed skin   Discharge Instructions   Discharge Instructions     Diet - low sodium heart healthy   Complete by: As directed    Diet Carb Modified   Complete by: As directed    Increase activity slowly   Complete by: As directed       Allergies as of 07/17/2022       Reactions   Atorvastatin Other (See Comments)   Dizziness    Dulaglutide Nausea And Vomiting, Other (See Comments)   Caused pancreatitis    Zofran [ondansetron] Nausea And Vomiting, Other (See Comments)   Causes nausea vomiting to worsen / doesn't really work for patient.   Amoxicillin Itching, Rash   Lantus [insulin Glargine] Itching, Rash   Miconazole Nitrate Nausea And Vomiting, Rash        Medication List     TAKE these medications    acetaminophen 500 MG tablet Commonly known as: TYLENOL Take 1,000 mg by mouth 3 (three) times daily as needed for moderate pain.   albuterol 108 (90 Base) MCG/ACT inhaler Commonly known as: ProAir HFA Inhale 2 puffs into the lungs every 6 (six) hours as needed for wheezing or shortness of breath.   Ensure Max Protein Liqd Take 330 mLs (11 oz total) by mouth daily.   FreeStyle Libre 2 Sensor Misc Inject 1 Device into the skin every 14 (fourteen) days.   hydrALAZINE 50 MG tablet Commonly known as: APRESOLINE Take 1 tablet (50 mg total) by mouth 3 (three) times daily.   hydrOXYzine 50 MG tablet Commonly known as: ATARAX Take 50 mg by mouth in the morning, at noon, and at bedtime.   LORazepam 0.5 MG tablet Commonly known as: ATIVAN Take 1 tablet (0.5 mg total) by mouth every 8 (eight) hours as needed for anxiety (Refractory nausea and vomiting).   LUBRICATING EYE DROPS OP Place 1-2 drops into both eyes daily as needed (dry eyes).   metoCLOPramide 10 MG tablet Commonly known as: Reglan Take 1 tablet (10 mg total) by mouth 3 (three) times daily before  meals.   metoprolol succinate 25 MG 24 hr tablet Commonly known as: Toprol XL Take 1 tablet (25 mg total) by mouth daily.   multivitamin with minerals Tabs tablet Take 1 tablet by mouth daily.   pantoprazole 40 MG tablet Commonly known as: Protonix Take 1 tablet (40 mg total) by mouth 2 (two) times daily before a meal.   polyethylene glycol 17 g packet Commonly known as: MIRALAX / GLYCOLAX Take 17 g by mouth daily as needed for moderate constipation.   pregabalin 50 MG capsule Commonly known as: LYRICA Take 50 mg by mouth 2 (two) times daily as needed (neuropathy).   prochlorperazine 10 MG tablet Commonly known as: COMPAZINE Take 1 tablet (10 mg total) by mouth every 6 (six) hours as needed for nausea or vomiting.   promethazine 25 MG suppository Commonly known as: PHENERGAN Unwrap and place 1 suppository (25 mg total) rectally every 6 (six) hours as needed for nausea or vomiting.   rosuvastatin 20 MG tablet Commonly known as: CRESTOR Take 1 tablet (20 mg total) by mouth daily.   scopolamine 1 MG/3DAYS Commonly known as: TRANSDERM-SCOP Place 1 patch (1.5 mg total) onto the skin every 3 (three) days.   tobramycin 0.3 % ophthalmic solution Commonly known as: TOBREX Place 1-2 drops  into both eyes See admin instructions. Instill 1-2 drops into both eyes 3 times daily the day before, day of, and the day after every 6 weeks eye injections per patient   torsemide 20 MG tablet Commonly known as: DEMADEX Take 20 mg by mouth daily as needed (swelling).       Allergies  Allergen Reactions   Atorvastatin Other (See Comments)    Dizziness    Dulaglutide Nausea And Vomiting and Other (See Comments)    Caused pancreatitis    Zofran [Ondansetron] Nausea And Vomiting and Other (See Comments)    Causes nausea vomiting to worsen / doesn't really work for patient.   Amoxicillin Itching and Rash   Lantus [Insulin Glargine] Itching and Rash   Miconazole Nitrate Nausea And Vomiting  and Rash    Follow-up Information     [facility]. Go in 22 day(s).   Specialty: Cardiology Why: Hospital follow up 08/06/2022 @ 12 noon PLEASE bring a current medication list to appointment FREE valet parking, Entrance C, off [employer] information: [address] [id number] [place] [phone]                 The results of significant diagnostics from this hospitalization (including imaging, microbiology, ancillary and laboratory) are listed below for reference.    Significant Diagnostic Studies: NM Pulmonary Perfusion  Result Date: 07/12/2022 CLINICAL DATA:  Shortness of breath for 1 day, cough, history MI, asthma, CHF, clinically suspicious for pulmonary embolism EXAM: NUCLEAR MEDICINE PERFUSION LUNG SCAN TECHNIQUE: Perfusion images were obtained in multiple projections after intravenous injection of radiopharmaceutical. Ventilation scans intentionally deferred if perfusion scan and chest x-ray adequate for interpretation during COVID 19 epidemic. RADIOPHARMACEUTICALS:  4.0 mCi Tc-53m MAA IV COMPARISON:  Chest radiograph 07/12/2022 FINDINGS: Flattening of the posterior aspects of the lungs on lateral views, likely representing BILATERAL pleural effusions. Otherwise normal perfusion lung scan. No segmental or subsegmental perfusion defects identified to suggest pulmonary embolism. IMPRESSION: Pulmonary embolism absent. BILATERAL pleural effusions. Electronically Signed   By: Lavonia Dana M.D.   On: 07/12/2022 12:32   DG Chest Port 1 View  Result Date: 07/12/2022 CLINICAL DATA:  Worsening shortness of breath EXAM: PORTABLE CHEST 1 VIEW COMPARISON:  Chest radiograph 07/12/2022 FINDINGS: Single lead AICD device overlies the left hemithorax. Monitoring leads overlie the patient. Stable cardiomegaly. Small bilateral pleural effusions and underlying consolidation. Pulmonary vascular  redistribution. No pneumothorax. IMPRESSION: 1. Cardiomegaly with pulmonary vascular congestion. 2. Small bilateral pleural effusions with underlying consolidation. Electronically Signed   By: Lovey Newcomer M.D.   On: 07/12/2022 09:39   DG Chest Port 1 View  Result Date: 07/12/2022 CLINICAL DATA:  Cough and chest pain. EXAM: PORTABLE CHEST 1 VIEW COMPARISON:  06/19/2022 FINDINGS: 0624 hours. The cardio pericardial silhouette is enlarged. There is pulmonary vascular congestion without overt pulmonary edema. Bibasilar atelectasis or infiltrate noted with small bilateral pleural effusions. Left-sided AICD again noted. Telemetry leads overlie the chest. IMPRESSION: Enlargement of the cardiopericardial silhouette with bibasilar atelectasis or infiltrate and small bilateral pleural effusions. Electronically Signed   By: Misty Stanley M.D.   On: 07/12/2022 07:00   DG Chest Port 1 View  Result Date: 06/19/2022 CLINICAL DATA:  Vomiting EXAM: PORTABLE CHEST 1 VIEW COMPARISON:  05/21/2022 FINDINGS: Lungs are clear. No pneumothorax or pleural effusion. Cardiac size within normal limits. 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 Pulmonary vascularity is normal. No acute bone abnormality. IMPRESSION: 1.  No active disease. Electronically Signed   By: Fidela Salisbury M.D.   On: 06/19/2022 01:34    Microbiology: Recent Results (from the past 240 hour(s))  Resp Panel by RT-PCR (Flu A&B, Covid) Anterior Nasal Swab     Status: None   Collection Time: 07/12/22  5:42 AM   Specimen: Anterior Nasal Swab  Result Value Ref Range Status   SARS Coronavirus 2 by RT PCR NEGATIVE NEGATIVE Final    Comment: (NOTE) SARS-CoV-2 target nucleic acids are NOT DETECTED.  The SARS-CoV-2 RNA is generally detectable in upper respiratory specimens during the acute phase of infection. The lowest concentration of SARS-CoV-2 viral copies this assay can detect is 138 copies/mL. A negative result does not preclude  SARS-Cov-2 infection and should not be used as the sole basis for treatment or other patient management decisions. A negative result may occur with  improper specimen collection/handling, submission of specimen other than nasopharyngeal swab, presence of viral mutation(s) within the areas targeted by this assay, and inadequate number of viral copies(<138 copies/mL). A negative result must be combined with clinical observations, patient history, and epidemiological information. The expected result is Negative.  Fact Sheet for Patients:  EntrepreneurPulse.com.au  Fact Sheet for Healthcare Providers:  IncredibleEmployment.be  This test is no t yet approved or cleared by the [place] FDA and  has been authorized for detection and/or diagnosis of SARS-CoV-2 by FDA under an Emergency Use Authorization (EUA). This EUA will remain  in effect (meaning this test can be used) for the duration of the COVID-19 declaration under Section 564(b)(1) of the Act, 21 U.S.C.section 360bbb-3(b)(1), unless the authorization is terminated  or revoked sooner.       Influenza A by PCR NEGATIVE NEGATIVE Final   Influenza B by PCR NEGATIVE NEGATIVE Final    Comment: (NOTE) The Xpert Xpress SARS-CoV-2/FLU/RSV plus assay is intended as an aid in the diagnosis of influenza from Nasopharyngeal swab specimens and should not be used as a sole basis for treatment. Nasal washings and aspirates are unacceptable for Xpert Xpress SARS-CoV-2/FLU/RSV testing.  Fact Sheet for Patients: EntrepreneurPulse.com.au  Fact Sheet for Healthcare Providers: IncredibleEmployment.be  This test is not yet approved or cleared by the [place] FDA and has been authorized for detection and/or diagnosis of SARS-CoV-2 by FDA under an Emergency Use Authorization (EUA). This EUA will remain in effect (meaning this test can be used) for the duration of  the COVID-19 declaration under Section 564(b)(1) of the Act, 21 U.S.C. section 360bbb-3(b)(1), unless the authorization is terminated or revoked.  Performed at [facility], [address] [place], [place] [address]      Labs: Basic Metabolic Panel: Recent Labs  Lab 07/13/22 0048 07/14/22 0041 07/15/22 0047 07/16/22 0034 07/17/22 0049  NA 141 139 136 138 139  K 4.1 3.7 3.4* 3.7 4.0  CL 108 107 103 106 108  CO2 $Re'24 24 24 23 23  'SUn$ GLUCOSE 205* 77 233* 169* 126*  BUN 22* 22* 28* 37* 32*  CREATININE 3.14* 3.13* 3.73* 3.15* 3.32*  CALCIUM 7.9* 7.9* 7.1* 7.8* 7.9*   Liver Function Tests: Recent Labs  Lab 07/12/22 0535 07/13/22 0048  AST 16 10*  ALT 13 14  ALKPHOS 89 72  BILITOT 1.2 1.2  PROT 6.3* 5.0*  ALBUMIN 3.3* 2.3*   Recent Labs  Lab 07/12/22 0535  LIPASE <10*   No results for input(s): "AMMONIA" in the last 168 hours. CBC: Recent Labs  Lab 07/12/22 0535 07/12/22 0551 07/13/22 0048 07/14/22  0041 07/15/22 0047 07/16/22 0034 07/17/22 0049  WBC 6.9   < > 8.5 5.3 6.2 7.0 5.8  NEUTROABS 5.6  --   --   --   --   --   --   HGB 8.7*   < > 7.4* 7.0* 7.2* 9.0* 9.5*  HCT 26.7*   < > 23.6* 21.3* 21.8* 27.2* 28.8*  MCV 86.4   < > 88.4 85.9 86.5 85.3 85.7  PLT 245   < > 253 224 234 269 265   < > = values in this interval not displayed.   Cardiac Enzymes: No results for input(s): "CKTOTAL", "CKMB", "CKMBINDEX", "TROPONINI" in the last 168 hours. BNP: BNP (last 3 results) Recent Labs    03/25/22 0900 04/21/22 1003 07/12/22 0535  BNP 709.8* 402.8* 1,035.3*    ProBNP (last 3 results) No results for input(s): "PROBNP" in the last 8760 hours.  CBG: Recent Labs  Lab 07/16/22 1236 07/16/22 1750 07/16/22 2102 07/17/22 0624 07/17/22 1108  GLUCAP 226* 133* 214* 223* 230*       Signed:  Domenic Polite MD.  [facility] 07/17/2022, 12:27 PM

## 2022-07-17 NOTE — TOC Initial Note (Signed)
Transition of Care [facility]) - Initial/Assessment Note    Patient Details  Name: [name] MRN: [id number] Date of Birth: 04-Dec-1970  Transition of [facility]) CM/SW Contact:    Zenon Mayo, RN Phone Number: 07/17/2022, 9:58 AM  Clinical Narrative:                 Patient is for dc today, she has no needs. She has a PCP, she states fiance will transport her home today.   Expected Discharge Plan: [place] Barriers to Discharge: No Barriers Identified   Patient Goals and CMS Choice Patient states their goals for this hospitalization and ongoing recovery are:: return home   Choice offered to / list presented to : NA  Expected Discharge Plan and Services Expected Discharge Plan: [place] In-house Referral: NA Discharge Planning Services: CM Consult Post Acute Care Choice: NA Living arrangements for the past 2 months: Single Family Home Expected Discharge Date: 07/17/22               DME Arranged: N/A DME Agency: NA       HH Arranged: NA          Prior Living Arrangements/Services Living arrangements for the past 2 months: Single Family Home Lives with::  (fiance) Patient language and need for interpreter reviewed:: Yes Do you feel safe going back to the place where you live?: Yes      Need for Family Participation in Patient Care: Yes (Comment) Care giver support system in place?: Yes (comment)   Criminal Activity/Legal Involvement Pertinent to Current Situation/Hospitalization: No - Comment as needed  Activities of Daily Living Home Assistive Devices/Equipment: Insulin Pump ADL Screening (condition at time of admission) Patient's cognitive ability adequate to safely complete daily activities?: Yes Is the patient deaf or have difficulty hearing?: No Does the patient have difficulty seeing, even when wearing glasses/contacts?: No Does the patient have difficulty concentrating, remembering, or making decisions?:  No Patient able to express need for assistance with ADLs?: Yes Does the patient have difficulty dressing or bathing?: No Independently performs ADLs?: Yes (appropriate for developmental age) Does the patient have difficulty walking or climbing stairs?: No Weakness of Legs: Right Weakness of Arms/Hands: None  Permission Sought/Granted                  Emotional Assessment   Attitude/Demeanor/Rapport: Engaged Affect (typically observed): Appropriate Orientation: : Oriented to Self, Oriented to Place, Oriented to  Time, Oriented to Situation Alcohol / Substance Use: Not Applicable Psych Involvement: No (comment)  Admission diagnosis:  Diabetic gastroparesis ([place]) [E11.43, K31.84] Elevated troponin [R79.89] Acute exacerbation of CHF (congestive heart failure) (HCC) [I50.9] Chronic anemia [D64.9] Central chest pain [R07.9] AKI (acute kidney injury) ([place]) [N17.9] Elevated d-dimer [R79.89] Chest pain [R07.9] Patient Active Problem List   Diagnosis Date Noted   Malnutrition of moderate degree 07/16/2022   Chest pain 07/12/2022   Acute exacerbation of CHF (congestive heart failure) ([place]) 07/12/2022   Protein-calorie malnutrition, severe 06/21/2022   Acute gastritis 05/23/2022   GI bleeding 05/23/2022   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 ([place]) 05/23/2022   Hypertensive urgency 05/23/2022   Abnormal CT scan, gastrointestinal tract 05/23/2022   Acute upper GI bleed 05/23/2022   Gastroparesis 05/13/2022   NSTEMI (non-ST elevated myocardial infarction) ([place]) 05/13/2022   Abdominal pain, epigastric    Chronic vomiting    Anemia    History of esophagitis    Intractable nausea and vomiting  04/23/2022   Cardiac arrest ([place]) 04/05/2022   Essential hypertension 04/05/2022   Acute esophagitis 04/03/2022   GI bleed 04/02/2022   Coffee ground emesis 04/01/2022   Stage 3b chronic kidney disease (CKD) ([place]) 03/25/2022   Inguinal adenopathy 12/14/2021   Diabetic  gastroparesis ([place]) 08/31/2020   Gastric polyps    Bone pain 06/16/2020   Deficiency anemia 04/21/2020   Chronic diastolic CHF (congestive heart failure) ([place]) 12/14/2019   Leg edema 11/21/2019   Dysfunctional uterine bleeding 10/11/2019   Nausea and vomiting    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[place]) 07/30/2016   Multiple thyroid nodules 00/86/7619   Complicated grieving 50/93/2671   Diabetic foot ulcers ([place]) 05/01/2013   Skin rash 01/05/2013   Contraception management 12/31/2012   Mood disorder ([place]) 12/30/2012   Unspecified vitamin D deficiency 12/03/2012   H/O thyroid nodule 12/03/2012   Type 1 diabetes mellitus with diabetic neuropathy ([place]) 06/18/2007   PCP:  [name], PA Pharmacy:   [facility] [address] [place] [address] Phone: [phone] Fax: [place], [facility] [facility] [address] [place] [address] [place] [address] Phone: [phone] Fax: [place] [address] Phone: [phone] Fax: [place] [id number] [place], [place] [address] Phone: [phone] Fax: [phone]     Social Determinants of Health (SDOH) Interventions Alcohol Usage Interventions: Intervention Not Indicated (Score <7) Financial Strain Interventions: Intervention Not Indicated  Readmission Risk Interventions    07/17/2022    9:51 AM 06/21/2022    9:35 AM 04/26/2022   10:53 AM  Readmission Risk Prevention Plan  Transportation Screening Complete Complete Complete  PCP or Specialist Appt within 5-7 Days   Complete  Home Care Screening   Complete  Medication Review (RN CM)   Complete  Medication Review (RN Care Manager) Complete Complete   PCP or Specialist appointment within 3-5 days of  discharge Complete Complete   HRI or Home Care Consult Complete Complete   SW Recovery Care/Counseling Consult  Complete   Palliative Care Screening Not Applicable Not [place] Not Applicable Complete

## 2022-07-17 NOTE — Progress Notes (Signed)
   Heart Failure Stewardship Pharmacist Progress Note   PCP: [name], PA PCP-Cardiologist: Sinclair Grooms, MD    HPI:  51 yo F with PMH of T1DM, gastroparesis, CHF, cardiac arrest s/p ICD, and CKD IV.   Hospitalized from 9/1-04/11/22 with cardiac arrest. Received CPR and defib shock prior to arrival. ECHO was done 04/07/22 and LVEF was 50-55% with no regional wall motion abnormalities, mild LVH, G2DD, RV normal. ICD was placed on 04/09/22.   Has had several hospitalizations and ED visits for nausea and vomiting due to gastroparesis.   She presented the ED on 12/8 with chest pain and persistent nausea/vomiting (was seen in the ED just 2 days prior). CXR with pulmonary vascular congestion. BNP elevated.   Discharge HF Medications: Diuretic: torsemide 20 mg daily PRN Beta Blocker: metoprolol XL 25 mg daily Other: hydralazine 50 mg TID  Prior to admission HF Medications: Diuretic: torsemide 20 mg daily PRN Beta blocker: metoprolol XL 25 mg daily  Pertinent Lab Values: Serum creatinine 3.32, BUN 32, Potassium 4.0, Sodium 139, BNP 1035.3, A1c 6.4   Vital Signs: Weight: 178 lbs (admission weight: 179 lbs) Blood pressure: 120-150/80s  Heart rate: 70-80s  I/O: -0.8L yesterday; net -3.8L  Medication Assistance / Insurance Benefits Check: Does the patient have prescription insurance?  Yes Type of insurance plan: [place]:  Prior to admission outpatient pharmacy[facility] Is the patient willing to use [facility] at discharge? Yes Is the patient willing to transition their outpatient pharmacy to utilize a [facility] outpatient pharmacy?   Pending    Assessment: 1. Acute on chronic diastolic CHF (LVEF 16-83%), due to NICM. NYHA class II symptoms. - Held diuretics with creatinine bump, resuming torsemide PRN at discharge - Continue metoprolol XL 25 mg daily - Continue hydralazine 50 mg TID - GDMT limited by CKD IV - No SGLT2i with  T1DM - Feraheme 510 mg IV x 1 given on 12/10, no cMRI x 3 months  Plan: 1) Medication changes recommended at this time: - Discharge today  2) Patient assistance: - None pending  3)  Education  - Patient has been educated on current HF medications and potential additions to HF medication regimen - Patient verbalizes understanding that over the next few months, these medication doses may change and more medications may be added to optimize HF regimen - Patient has been educated on basic disease state pathophysiology and goals of therapy   Kerby Nora, PharmD, BCPS Heart Failure Stewardship Pharmacist Phone [phone]

## 2022-07-17 NOTE — Progress Notes (Signed)
   07/17/22 1000  Mobility  Activity Ambulated independently in hallway  Level of Assistance Independent  Assistive Device None  Distance Ambulated (ft) 200 ft  Activity Response Tolerated well  Mobility Referral Yes  $Mobility charge 1 Mobility   Mobility Specialist Progress Note  Pre-Mobility: 90 HR During Mobility: 130 HR Post-Mobility: 92 HR  Pt was in bed and agreeable. Had no c/o pain throughout ambulation. Returned to bed w/ all needs met and call bell in reach.   Lucious Groves Mobility Specialist  Please contact via SecureChat or Rehab office at [phone]

## 2022-07-17 NOTE — Inpatient Diabetes Management (Addendum)
Inpatient Diabetes Program Recommendations  AACE/ADA: New Consensus Statement on Inpatient Glycemic Control   Target Ranges:  Prepandial:   less than 140 mg/dL      Peak postprandial:   less than 180 mg/dL (1-2 hours)      Critically ill patients:  140 - 180 mg/dL    Latest Reference Range & Units 07/16/22 06:25 07/16/22 12:36 07/16/22 17:50 07/16/22 21:02 07/17/22 06:24  Glucose-Capillary 70 - 99 mg/dL 178 (H) 226 (H) 133 (H) 214 (H) 223 (H)   Review of Glycemic Control  Diabetes history: Type 1 DM (requires basal, carb coverage, and correction insulin) Outpatient Diabetes medications: OmniPod insulin pump with Novolog; Tresiba 50 units daily if not using pump; FreeStyle Libre3 CGM Current orders for Inpatient glycemic control: Levemir 8 units daily, Novolog 0-9 units TID with meals, Novolog 0-5 units QHS  Inpatient Diabetes Program Recommendations:    Insulin: Please consider ordering Novolog 2 units TID with meals for meal coverage if patient eats at least 50% of meals.  Addendum 07/17/22$RemoveBeforeDEI'@9'[id number]$ :15-Received referral to provide patient with CGM since patients CGM had fallen off. Patient reports she uses FreeStyle Libre3 and that it was knocked off with blood pressure cuff when staff checked her blood pressure.  Provided patient with FreeStyle Libre3 CGM sensor sample.  Thanks, Barnie Alderman, RN, MSN, [place] Diabetes Coordinator Inpatient Diabetes Program [phone] (Team Pager from 8am to [place])

## 2022-07-17 NOTE — TOC Transition Note (Signed)
Transition of Care [facility]) - CM/SW Discharge Note   Patient Details  Name: [name] MRN: [id number] Date of Birth: [birth year]  [facility] CM/SW Contact:  Zenon Mayo, RN Phone Number: 07/17/2022, 10:00 AM   Clinical Narrative:     Patient is for dc today, she has no needs. She has a PCP, she states fiance will transport her home today.   Final next level of care: [place] Barriers to Discharge: No Barriers Identified   Patient Goals and CMS Choice Patient states their goals for this hospitalization and ongoing recovery are:: return home   Choice offered to / list presented to : NA  Discharge Placement                       Discharge Plan and Services In-house Referral: NA Discharge Planning Services: CM Consult Post Acute Care Choice: NA          DME Arranged: N/A DME Agency: NA       HH Arranged: NA          Social Determinants of Health (SDOH) Interventions Alcohol Usage Interventions: Intervention Not Indicated (Score <7) Financial Strain Interventions: Intervention Not Indicated   Readmission Risk Interventions    07/17/2022    9:51 AM 06/21/2022    9:35 AM 04/26/2022   10:53 AM  Readmission Risk Prevention Plan  Transportation Screening Complete Complete Complete  PCP or Specialist Appt within 5-7 Days   Complete  Home Care Screening   Complete  Medication Review (RN CM)   Complete  Medication Review (RN Care Manager) Complete Complete   PCP or Specialist appointment within 3-5 days of discharge Complete Complete   HRI or Home Care Consult Complete Complete   SW Recovery Care/Counseling Consult  Complete   Palliative Care Screening Not Applicable Not [place] Not Applicable Complete

## 2022-07-18 ENCOUNTER — Ambulatory Visit (HOSPITAL_COMMUNITY)
Admission: RE | Admit: 2022-07-18 | Discharge: 2022-07-18 | Disposition: A | Discharge status: Home / Self Care | Payer: BC Managed Care – PPO | Source: Ambulatory Visit | Attending: Gastroenterology | Admitting: Gastroenterology

## 2022-07-18 DIAGNOSIS — R1110 Vomiting, unspecified: Secondary | ICD-10-CM | POA: Diagnosis not present

## 2022-07-18 DIAGNOSIS — K3184 Gastroparesis: Secondary | ICD-10-CM | POA: Diagnosis not present

## 2022-07-18 DIAGNOSIS — Z0389 Encounter for observation for other suspected diseases and conditions ruled out: Secondary | ICD-10-CM | POA: Diagnosis not present

## 2022-07-18 MED ORDER — TECHNETIUM TC 99M SULFUR COLLOID
2.1000 | Freq: Once | INTRAVENOUS | Status: AC
  Administered 2022-07-18: 2.1 via ORAL

## 2022-07-19 ENCOUNTER — Encounter: Payer: BC Managed Care – PPO | Admitting: Cardiovascular Disease

## 2022-07-21 ENCOUNTER — Other Ambulatory Visit: Payer: Self-pay

## 2022-07-21 ENCOUNTER — Emergency Department (HOSPITAL_BASED_OUTPATIENT_CLINIC_OR_DEPARTMENT_OTHER): Payer: BC Managed Care – PPO

## 2022-07-21 ENCOUNTER — Inpatient Hospital Stay (HOSPITAL_BASED_OUTPATIENT_CLINIC_OR_DEPARTMENT_OTHER)
Admission: EM | Admit: 2022-07-21 | Discharge: 2022-07-24 | DRG: 074 | Disposition: A | Discharge status: Home / Self Care | Payer: BC Managed Care – PPO | Attending: Family Medicine | Admitting: Family Medicine

## 2022-07-21 ENCOUNTER — Encounter (HOSPITAL_BASED_OUTPATIENT_CLINIC_OR_DEPARTMENT_OTHER): Payer: Self-pay

## 2022-07-21 DIAGNOSIS — I130 Hypertensive heart and chronic kidney disease with heart failure and stage 1 through stage 4 chronic kidney disease, or unspecified chronic kidney disease: Secondary | ICD-10-CM | POA: Diagnosis not present

## 2022-07-21 DIAGNOSIS — I2489 Other forms of acute ischemic heart disease: Secondary | ICD-10-CM | POA: Diagnosis not present

## 2022-07-21 DIAGNOSIS — Z881 Allergy status to other antibiotic agents status: Secondary | ICD-10-CM

## 2022-07-21 DIAGNOSIS — Z818 Family history of other mental and behavioral disorders: Secondary | ICD-10-CM | POA: Diagnosis not present

## 2022-07-21 DIAGNOSIS — I10 Essential (primary) hypertension: Secondary | ICD-10-CM | POA: Diagnosis present

## 2022-07-21 DIAGNOSIS — E1065 Type 1 diabetes mellitus with hyperglycemia: Secondary | ICD-10-CM | POA: Diagnosis present

## 2022-07-21 DIAGNOSIS — F32A Depression, unspecified: Secondary | ICD-10-CM | POA: Diagnosis present

## 2022-07-21 DIAGNOSIS — D638 Anemia in other chronic diseases classified elsewhere: Secondary | ICD-10-CM | POA: Diagnosis present

## 2022-07-21 DIAGNOSIS — R112 Nausea with vomiting, unspecified: Secondary | ICD-10-CM | POA: Diagnosis not present

## 2022-07-21 DIAGNOSIS — F419 Anxiety disorder, unspecified: Secondary | ICD-10-CM | POA: Diagnosis not present

## 2022-07-21 DIAGNOSIS — Z833 Family history of diabetes mellitus: Secondary | ICD-10-CM | POA: Diagnosis not present

## 2022-07-21 DIAGNOSIS — I5032 Chronic diastolic (congestive) heart failure: Secondary | ICD-10-CM | POA: Diagnosis present

## 2022-07-21 DIAGNOSIS — E109 Type 1 diabetes mellitus without complications: Secondary | ICD-10-CM | POA: Diagnosis present

## 2022-07-21 DIAGNOSIS — E1022 Type 1 diabetes mellitus with diabetic chronic kidney disease: Secondary | ICD-10-CM | POA: Diagnosis not present

## 2022-07-21 DIAGNOSIS — K3184 Gastroparesis: Secondary | ICD-10-CM | POA: Diagnosis not present

## 2022-07-21 DIAGNOSIS — N184 Chronic kidney disease, stage 4 (severe): Secondary | ICD-10-CM | POA: Diagnosis present

## 2022-07-21 DIAGNOSIS — Z95810 Presence of automatic (implantable) cardiac defibrillator: Secondary | ICD-10-CM | POA: Diagnosis not present

## 2022-07-21 DIAGNOSIS — R7989 Other specified abnormal findings of blood chemistry: Secondary | ICD-10-CM | POA: Diagnosis not present

## 2022-07-21 DIAGNOSIS — I517 Cardiomegaly: Secondary | ICD-10-CM | POA: Diagnosis not present

## 2022-07-21 DIAGNOSIS — E1043 Type 1 diabetes mellitus with diabetic autonomic (poly)neuropathy: Principal | ICD-10-CM | POA: Diagnosis present

## 2022-07-21 DIAGNOSIS — E860 Dehydration: Secondary | ICD-10-CM | POA: Diagnosis not present

## 2022-07-21 DIAGNOSIS — N186 End stage renal disease: Secondary | ICD-10-CM | POA: Diagnosis present

## 2022-07-21 DIAGNOSIS — Z794 Long term (current) use of insulin: Secondary | ICD-10-CM

## 2022-07-21 DIAGNOSIS — Z888 Allergy status to other drugs, medicaments and biological substances status: Secondary | ICD-10-CM

## 2022-07-21 DIAGNOSIS — M1990 Unspecified osteoarthritis, unspecified site: Secondary | ICD-10-CM | POA: Diagnosis not present

## 2022-07-21 DIAGNOSIS — Z79899 Other long term (current) drug therapy: Secondary | ICD-10-CM | POA: Diagnosis not present

## 2022-07-21 DIAGNOSIS — N1832 Chronic kidney disease, stage 3b: Secondary | ICD-10-CM | POA: Diagnosis not present

## 2022-07-21 DIAGNOSIS — Z8674 Personal history of sudden cardiac arrest: Secondary | ICD-10-CM | POA: Diagnosis not present

## 2022-07-21 DIAGNOSIS — R0989 Other specified symptoms and signs involving the circulatory and respiratory systems: Secondary | ICD-10-CM | POA: Diagnosis not present

## 2022-07-21 DIAGNOSIS — D631 Anemia in chronic kidney disease: Secondary | ICD-10-CM | POA: Diagnosis present

## 2022-07-21 DIAGNOSIS — Z8249 Family history of ischemic heart disease and other diseases of the circulatory system: Secondary | ICD-10-CM

## 2022-07-21 DIAGNOSIS — E1143 Type 2 diabetes mellitus with diabetic autonomic (poly)neuropathy: Secondary | ICD-10-CM | POA: Diagnosis not present

## 2022-07-21 DIAGNOSIS — Z950 Presence of cardiac pacemaker: Secondary | ICD-10-CM | POA: Diagnosis not present

## 2022-07-21 DIAGNOSIS — R000 Tachycardia, unspecified: Secondary | ICD-10-CM | POA: Diagnosis not present

## 2022-07-21 DIAGNOSIS — Z9641 Presence of insulin pump (external) (internal): Secondary | ICD-10-CM | POA: Diagnosis present

## 2022-07-21 DIAGNOSIS — R1013 Epigastric pain: Secondary | ICD-10-CM | POA: Diagnosis not present

## 2022-07-21 LAB — URINALYSIS, ROUTINE W REFLEX MICROSCOPIC
Bilirubin Urine: NEGATIVE
Glucose, UA: 100 mg/dL — AB
Ketones, ur: NEGATIVE mg/dL
Leukocytes,Ua: NEGATIVE
Nitrite: NEGATIVE
Protein, ur: 100 mg/dL — AB
Specific Gravity, Urine: 1.017 (ref 1.005–1.030)
pH: 8.5 — ABNORMAL HIGH (ref 5.0–8.0)

## 2022-07-21 LAB — COMPREHENSIVE METABOLIC PANEL
ALT: 9 U/L (ref 0–44)
AST: 12 U/L — ABNORMAL LOW (ref 15–41)
Albumin: 3.6 g/dL (ref 3.5–5.0)
Alkaline Phosphatase: 77 U/L (ref 38–126)
Anion gap: 12 (ref 5–15)
BUN: 29 mg/dL — ABNORMAL HIGH (ref 6–20)
CO2: 26 mmol/L (ref 22–32)
Calcium: 9.1 mg/dL (ref 8.9–10.3)
Chloride: 105 mmol/L (ref 98–111)
Creatinine, Ser: 3.04 mg/dL — ABNORMAL HIGH (ref 0.44–1.00)
GFR, Estimated: 18 mL/min — ABNORMAL LOW (ref >60)
Glucose, Bld: 193 mg/dL — ABNORMAL HIGH (ref 70–99)
Potassium: 4.2 mmol/L (ref 3.5–5.1)
Sodium: 143 mmol/L (ref 135–145)
Total Bilirubin: 0.8 mg/dL (ref 0.3–1.2)
Total Protein: 6.9 g/dL (ref 6.5–8.1)

## 2022-07-21 LAB — CBC WITH DIFFERENTIAL/PLATELET
Abs Immature Granulocytes: 0.03 10*3/uL (ref 0.00–0.07)
Basophils Absolute: 0 10*3/uL (ref 0.0–0.1)
Basophils Relative: 0 %
Eosinophils Absolute: 0.1 10*3/uL (ref 0.0–0.5)
Eosinophils Relative: 1 %
HCT: 34.6 % — ABNORMAL LOW (ref 36.0–46.0)
Hemoglobin: 11.3 g/dL — ABNORMAL LOW (ref 12.0–15.0)
Immature Granulocytes: 0 %
Lymphocytes Relative: 17 %
Lymphs Abs: 1.1 10*3/uL (ref 0.7–4.0)
MCH: 28.1 pg (ref 26.0–34.0)
MCHC: 32.7 g/dL (ref 30.0–36.0)
MCV: 86.1 fL (ref 80.0–100.0)
Monocytes Absolute: 0.4 10*3/uL (ref 0.1–1.0)
Monocytes Relative: 6 %
Neutro Abs: 5.2 10*3/uL (ref 1.7–7.7)
Neutrophils Relative %: 76 %
Platelets: 300 10*3/uL (ref 150–400)
RBC: 4.02 MIL/uL (ref 3.87–5.11)
RDW: 15.4 % (ref 11.5–15.5)
WBC: 6.9 10*3/uL (ref 4.0–10.5)
nRBC: 0 % (ref 0.0–0.2)

## 2022-07-21 LAB — LIPASE, BLOOD: Lipase: <10 U/L — ABNORMAL LOW (ref 11–51)

## 2022-07-21 LAB — CBG MONITORING, ED: Glucose-Capillary: 123 mg/dL — ABNORMAL HIGH (ref 70–99)

## 2022-07-21 LAB — LACTIC ACID, PLASMA: Lactic Acid, Venous: 0.9 mmol/L (ref 0.5–1.9)

## 2022-07-21 MED ORDER — SODIUM CHLORIDE 0.9 % IV SOLN
12.5000 mg | Freq: Four times a day (QID) | INTRAVENOUS | Status: DC | PRN
  Administered 2022-07-22: 12.5 mg via INTRAVENOUS
  Filled 2022-07-21: qty 0.5

## 2022-07-21 MED ORDER — DIPHENHYDRAMINE HCL 25 MG PO CAPS: 25.0000 mg | ORAL_CAPSULE | Freq: Once | ORAL | Status: DC

## 2022-07-21 MED ORDER — HYDRALAZINE HCL 20 MG/ML IJ SOLN
10.0000 mg | Freq: Once | INTRAMUSCULAR | Status: AC
  Administered 2022-07-21: 10 mg via INTRAVENOUS
  Filled 2022-07-21: qty 1

## 2022-07-21 MED ORDER — MORPHINE SULFATE (PF) 4 MG/ML IV SOLN
4.0000 mg | Freq: Once | INTRAVENOUS | Status: AC
  Administered 2022-07-21: 4 mg via INTRAVENOUS
  Filled 2022-07-21: qty 1

## 2022-07-21 MED ORDER — LACTATED RINGERS IV BOLUS
1000.0000 mL | Freq: Once | INTRAVENOUS | Status: AC
  Administered 2022-07-22: 1000 mL via INTRAVENOUS

## 2022-07-21 MED ORDER — METOCLOPRAMIDE HCL 5 MG/ML IJ SOLN
10.0000 mg | Freq: Once | INTRAMUSCULAR | Status: AC
  Administered 2022-07-21: 10 mg via INTRAVENOUS
  Filled 2022-07-21: qty 2

## 2022-07-21 MED ORDER — LACTATED RINGERS IV BOLUS
1000.0000 mL | Freq: Once | INTRAVENOUS | Status: AC
  Administered 2022-07-21: 1000 mL via INTRAVENOUS

## 2022-07-21 MED ORDER — DIPHENHYDRAMINE HCL 50 MG/ML IJ SOLN
25.0000 mg | Freq: Once | INTRAMUSCULAR | Status: AC
  Administered 2022-07-21: 25 mg via INTRAVENOUS
  Filled 2022-07-21: qty 1

## 2022-07-21 NOTE — ED Provider Notes (Signed)
[facility] EMERGENCY DEPT Provider Note   CSN: [id number] Arrival date & time: 07/21/22  1856     History  Chief Complaint  Patient presents with   Emesis    Jayley Hustead is a 51 y.o. 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  Patient presents with acute onset severe nausea vomiting that began last night.  Associated with severe 10 out of 10 epigastric abdominal pain.  States that this feels like her prior episodes of nausea vomiting and feels exactly the same as her prior admission.  Denies any urinary symptoms, diarrhea/constipation.  Last bowel movement was this morning which is normal for her, she is still passing gas. She denies f/c, CP, SOB, melena/hematochezia, LEE, urinary or vaginal symptoms.  Per chart review patient was admitted from 07/12/2022 to 07/17/2022 for acute on chronic diastolic heart failure, severe gastroparesis and recurrent nausea vomiting.   Emesis      Home Medications Prior to Admission medications   Medication Sig Start Date End Date Taking? Authorizing Provider  acetaminophen (TYLENOL) 500 MG tablet Take 1,000 mg by mouth 3 (three) times daily as needed for moderate pain.    [provider]  albuterol (PROAIR HFA) 108 (90 Base) MCG/ACT inhaler Inhale 2 puffs into the lungs every 6 (six) hours as needed for wheezing or shortness of breath. 07/03/20   [name], PA-C  Carboxymethylcellul-Glycerin (LUBRICATING EYE DROPS OP) Place 1-2 drops into both eyes daily as needed (dry eyes).    [provider]  Continuous Blood Gluc Sensor (FREESTYLE LIBRE 2 SENSOR) MISC Inject 1 Device into the skin every 14 (fourteen) days. 03/20/20   [provider]  Ensure Max Protein  (ENSURE MAX PROTEIN) LIQD Take 330 mLs (11 oz total) by mouth daily. Patient not taking: Reported on 07/12/2022 04/30/22   Modena Jansky, MD  hydrALAZINE (APRESOLINE) 50 MG tablet Take 1 tablet (50 mg total) by mouth 3 (three) times daily. 07/17/22   Domenic Polite, MD  hydrOXYzine (ATARAX) 50 MG tablet Take 50 mg by mouth in the morning, at noon, and at bedtime.    [provider]  LORazepam (ATIVAN) 0.5 MG tablet Take 1 tablet (0.5 mg total) by mouth every 8 (eight) hours as needed for anxiety (Refractory nausea and vomiting). 07/17/22   Domenic Polite, MD  metoCLOPramide (REGLAN) 10 MG tablet Take 1 tablet (10 mg total) by mouth 3 (three) times daily before meals. 07/17/22   Domenic Polite, MD  metoprolol succinate (TOPROL XL) 25 MG 24 hr tablet Take 1 tablet (25 mg total) by mouth daily. 04/19/22   [name], PA-C  Multiple Vitamin (MULTIVITAMIN WITH MINERALS) TABS tablet Take 1 tablet by mouth daily. Patient not taking: Reported on 07/12/2022 04/30/22   Modena Jansky, MD  pantoprazole (PROTONIX) 40 MG tablet Take 1 tablet (40 mg total) by mouth 2 (two) times daily before a meal. 05/24/22 07/23/22  Rai, Ripudeep K, MD  polyethylene glycol (MIRALAX / GLYCOLAX) 17 g packet Take 17 g by mouth daily as needed for moderate constipation. Patient not taking: Reported on 07/12/2022    [provider]  pregabalin (LYRICA) 50 MG capsule Take 50 mg by mouth 2 (two) times daily as needed (neuropathy). 12/05/21   [provider]  prochlorperazine (COMPAZINE) 10 MG tablet Take 1  tablet (10 mg total) by mouth every 6 (six) hours as needed for nausea or vomiting. 05/24/22   Rai, [name], MD  promethazine (PHENERGAN) 25 MG suppository Unwrap and place 1 suppository (25 mg total) rectally every 6 (six) hours as needed for nausea or vomiting. 07/17/22   Domenic Polite, MD  rosuvastatin (CRESTOR) 20 MG tablet Take 1 tablet (20 mg total) by mouth daily. 05/24/22 09/21/22   Rai, [name], MD  scopolamine (TRANSDERM-SCOP) 1 MG/3DAYS Place 1 patch (1.5 mg total) onto the skin every 3 (three) days. 05/26/22   Rai, Ripudeep K, MD  tobramycin (TOBREX) 0.3 % ophthalmic solution Place 1-2 drops into both eyes See admin instructions. Instill 1-2 drops into both eyes 3 times daily the day before, day of, and the day after every 6 weeks eye injections per patient 11/17/20   [provider]  torsemide (DEMADEX) 20 MG tablet Take 20 mg by mouth daily as needed (swelling). 12/25/21   [provider]      Allergies    Atorvastatin, Dulaglutide, Zofran [ondansetron], Amoxicillin, Lantus [insulin glargine], and Miconazole nitrate    Review of Systems   Review of Systems  Gastrointestinal:  Positive for vomiting.   Review of systems Negative for f/c.  A 10 point review of systems was performed and is negative unless otherwise reported in HPI.  Physical Exam Updated Vital Signs BP (!) 151/136   Pulse (!) 132   Temp 98.1 F (36.7 C)   Resp (!) 24   Ht 6' (1.829 m)   Wt 81 kg   LMP 11/17/2021 (Approximate)   SpO2 98%   BMI 24.22 kg/m  Physical Exam General: Uncomfortable appearing female, lying in bed. Actively vomiting HEENT: Sclera anicteric, MMM, trachea midline.  Cardiology: RRR, no murmurs/rubs/gallops. BL radial and DP pulses equal bilaterally.  Resp: Normal respiratory rate and effort. CTAB, no wheezes, rhonchi, crackles.  Abd: Diffusely tender especially in epigastric region. Soft, non-distended. No rebound tenderness or guarding.  GU: Deferred. MSK: No peripheral edema or signs of trauma. Extremities without deformity or TTP. No cyanosis or clubbing. Skin: warm, dry. No rashes or lesions. Back: No CVA tenderness Neuro: A&Ox4, CNs II-XII grossly intact. MAEs. Sensation grossly intact.  Psych: Normal mood and affect.   ED Results / Procedures / Treatments   Labs (all labs ordered are listed, but only abnormal results are displayed) Labs  Reviewed  CBG MONITORING, ED - Abnormal; Notable for the following components:      Result Value   Glucose-Capillary 123 (*)    All other components within normal limits  CBC WITH DIFFERENTIAL/PLATELET  COMPREHENSIVE METABOLIC PANEL  URINALYSIS, ROUTINE W REFLEX MICROSCOPIC    EKG EKG Interpretation  Date/Time:  Monday July 22 2022 12:47:17 EST Ventricular Rate:  117 PR Interval:  132 QRS Duration: 84 QT Interval:  305 QTC Calculation: 426 R Axis:   86 Text Interpretation: Sinus tachycardia Borderline T abnormalities, diffuse leads Confirmed by Gerlene Fee [phone]) on 07/25/2022 2:18:58 PM  Radiology DG abdomen 1 view: FINDINGS: Relative paucity of bowel gas in the upper abdomen. Scattered air in the colon in the right lower abdomen. Nonobstructive pattern. No free intraperitoneal air given the limits of the supine images. No radio-opaque calculi or other significant radiographic abnormality are seen.Partially visualized AICD lead overlying the right ventricle.   IMPRESSION: Nonobstructive bowel gas pattern.  Procedures Procedures    Medications Ordered in ED Medications  metoCLOPramide (REGLAN) injection 10 mg (has no administration in time range)  diphenhydrAMINE (BENADRYL) capsule 25 mg (has no administration in time range)    ED Course/ Medical Decision Making/ A&P                          Medical Decision Making Amount and/or Complexity of Data Reviewed Labs: ordered. Decision-making details documented in ED Course. Radiology: ordered. Decision-making details documented in ED Course.  Risk Prescription drug management. Decision regarding hospitalization.     MDM:    Patient is afebrile and presents tachycardic into the 130s in the setting of GI distress.  Patient with N/V similar to prior episodes of gastroparesis. Consider her known gastroparesis, electrolyte abnormalities, renal injury, dehydration hypoglycemia.  Will treat patient with fluids  and obtain a lactate.  She is still passing gas and having stool, low c/f obstructive pathology such as SBO or volvulus, will obtain abd XR to evaluate. As it is so similar to her prior episodes, lower c/f mesenteric ischemia, appendicitis, diverticulitis, cholecystitis/cholelithiasis. Patient does not have any signs of acute abdomen at this time.  She has not had any fever or chills.  She is diffusely on her belly mostly in the epigastric region raising concern for pancreatitis.  Patient was noted to be very hypertensive in the room but could not keep any of her oral medications down which includes antihypertensives.   Clinical Course as of 08/19/22 1421  Sun Jul 21, 2022  2107 BP(!): 188/139 Patient with BP 749 systolic in the room. She could not take any of her medications today including her antihypertensives due to vomiting/nausea. Will treat with IV hydralazine one dose.  [HN]  2108 Glucose-Capillary(!): 123 [HN]  2108 WBC: 6.9 [HN]  2108 Hemoglobin(!): 11.3 [HN]  2229 Lactic Acid, Venous: 0.9 [HN]  2229 Lipase(!): <10 [HN]  2229 DG Abdomen 1 View FINDINGS: Relative paucity of bowel gas in the upper abdomen. Scattered air in the colon in the right lower abdomen. Nonobstructive pattern. No free intraperitoneal air given the limits of the supine images. No radio-opaque calculi or other significant radiographic abnormality are seen.Partially visualized AICD lead overlying the right ventricle.  IMPRESSION: Nonobstructive bowel gas pattern.   [HN]    Clinical Course User Index [HN] Audley Hose, MD     Labs: I Ordered, and personally interpreted labs.  The pertinent results include:  those listed above  Imaging Studies ordered: I ordered imaging studies including abd xr I independently visualized and interpreted imaging. I agree with the radiologist interpretation  Additional history obtained from chart review  Cardiac Monitoring: The patient was maintained on a cardiac  monitor.  I personally viewed and interpreted the cardiac monitored which showed an underlying rhythm of: ST  Reevaluation: After the interventions noted above, I reevaluated the patient and found that they have :improved  Social Determinants of Health: Patient lives independently   Disposition: Patient's w/u is reassuring. Her abdominal x-ray is reassuring against small bowel obstruction and her lactate is 0.9.  Lipase is negative, there is no significant leukocytosis.  I will first treat the patient with Reglan per her request associated with Benadryl and give IV fluids.  Patient is signed out to the oncoming ED physician who is made aware of her history, presentation, exam, workup, and plan.  Plan is to administer IV fluids and antiemetics and reevaluate patient.  She potentially will need admission without symptom control as she is unable to take p.o. or any of her medications at home with her symptoms as they  are.   Co morbidities that complicate the patient evaluation  Past Medical History:  Diagnosis Date   Anemia    Anxiety    Arthritis    Asthma    CHF (congestive heart failure) (HCC)    Depression    Diabetic ulcer of left foot ([place]) 05/12/2013   Gastroparesis    Generalized abdominal pain    History of chicken pox    Loss of weight 12/02/2019   Migraines    Mood disorder (HCC)    anxiety   Prurigo nodularis    with diabetic dermopathy   Type 1 diabetes, uncontrolled, with neuropathy    [place]   Ulcers of both lower legs ([place]) 02/20/2014     Medicines Meds ordered this encounter  Medications   metoCLOPramide (REGLAN) injection 10 mg   diphenhydrAMINE (BENADRYL) capsule 25 mg    I have reviewed the patients home medicines and have made adjustments as needed  Problem List / ED Course: Problem List Items Addressed This Visit       Digestive   Diabetic gastroparesis ([place]) - Primary (Chronic)   Relevant Medications   insulin aspart (NOVOLOG) 100 UNIT/ML  injection               This note was created using dictation software, which may contain spelling or grammatical errors.    Audley Hose, MD 08/19/22 [phone]

## 2022-07-21 NOTE — ED Notes (Signed)
Attempted IV x 1 stick, pt will need an [place] IV

## 2022-07-21 NOTE — ED Triage Notes (Addendum)
Pt has hx of gastroparesis and has been vomiting since 2am Has an insulin pump and has taken it off so her sugars will not drop  Was just dc'd Wednesday from hospital for the same Had a gastric study on Thursday While in triage began c/o CP had had MI in September and has a defibrillator

## 2022-07-21 NOTE — ED Notes (Signed)
ED Provider at bedside. 

## 2022-07-22 ENCOUNTER — Inpatient Hospital Stay (HOSPITAL_COMMUNITY): Payer: BC Managed Care – PPO

## 2022-07-22 ENCOUNTER — Encounter (HOSPITAL_COMMUNITY): Payer: Self-pay | Admitting: Family Medicine

## 2022-07-22 ENCOUNTER — Other Ambulatory Visit: Payer: Self-pay

## 2022-07-22 ENCOUNTER — Encounter (HOSPITAL_COMMUNITY): Payer: Self-pay

## 2022-07-22 DIAGNOSIS — Z888 Allergy status to other drugs, medicaments and biological substances status: Secondary | ICD-10-CM | POA: Diagnosis not present

## 2022-07-22 DIAGNOSIS — R112 Nausea with vomiting, unspecified: Secondary | ICD-10-CM | POA: Diagnosis not present

## 2022-07-22 DIAGNOSIS — N1832 Chronic kidney disease, stage 3b: Secondary | ICD-10-CM

## 2022-07-22 DIAGNOSIS — Z8249 Family history of ischemic heart disease and other diseases of the circulatory system: Secondary | ICD-10-CM | POA: Diagnosis not present

## 2022-07-22 DIAGNOSIS — R000 Tachycardia, unspecified: Secondary | ICD-10-CM

## 2022-07-22 DIAGNOSIS — E1143 Type 2 diabetes mellitus with diabetic autonomic (poly)neuropathy: Secondary | ICD-10-CM | POA: Diagnosis present

## 2022-07-22 DIAGNOSIS — I130 Hypertensive heart and chronic kidney disease with heart failure and stage 1 through stage 4 chronic kidney disease, or unspecified chronic kidney disease: Secondary | ICD-10-CM | POA: Diagnosis present

## 2022-07-22 DIAGNOSIS — K3184 Gastroparesis: Secondary | ICD-10-CM

## 2022-07-22 DIAGNOSIS — N184 Chronic kidney disease, stage 4 (severe): Secondary | ICD-10-CM | POA: Diagnosis present

## 2022-07-22 DIAGNOSIS — Z95810 Presence of automatic (implantable) cardiac defibrillator: Secondary | ICD-10-CM | POA: Diagnosis not present

## 2022-07-22 DIAGNOSIS — E1022 Type 1 diabetes mellitus with diabetic chronic kidney disease: Secondary | ICD-10-CM

## 2022-07-22 DIAGNOSIS — F419 Anxiety disorder, unspecified: Secondary | ICD-10-CM | POA: Diagnosis present

## 2022-07-22 DIAGNOSIS — R7989 Other specified abnormal findings of blood chemistry: Secondary | ICD-10-CM

## 2022-07-22 DIAGNOSIS — M1990 Unspecified osteoarthritis, unspecified site: Secondary | ICD-10-CM | POA: Diagnosis present

## 2022-07-22 DIAGNOSIS — R1013 Epigastric pain: Secondary | ICD-10-CM

## 2022-07-22 DIAGNOSIS — E1065 Type 1 diabetes mellitus with hyperglycemia: Secondary | ICD-10-CM | POA: Diagnosis present

## 2022-07-22 DIAGNOSIS — Z794 Long term (current) use of insulin: Secondary | ICD-10-CM | POA: Diagnosis not present

## 2022-07-22 DIAGNOSIS — Z79899 Other long term (current) drug therapy: Secondary | ICD-10-CM | POA: Diagnosis not present

## 2022-07-22 DIAGNOSIS — D638 Anemia in other chronic diseases classified elsewhere: Secondary | ICD-10-CM

## 2022-07-22 DIAGNOSIS — Z881 Allergy status to other antibiotic agents status: Secondary | ICD-10-CM | POA: Diagnosis not present

## 2022-07-22 DIAGNOSIS — E860 Dehydration: Secondary | ICD-10-CM | POA: Diagnosis present

## 2022-07-22 DIAGNOSIS — I2489 Other forms of acute ischemic heart disease: Secondary | ICD-10-CM | POA: Diagnosis present

## 2022-07-22 DIAGNOSIS — I5032 Chronic diastolic (congestive) heart failure: Secondary | ICD-10-CM | POA: Diagnosis present

## 2022-07-22 DIAGNOSIS — Z818 Family history of other mental and behavioral disorders: Secondary | ICD-10-CM | POA: Diagnosis not present

## 2022-07-22 DIAGNOSIS — D631 Anemia in chronic kidney disease: Secondary | ICD-10-CM | POA: Diagnosis present

## 2022-07-22 DIAGNOSIS — I10 Essential (primary) hypertension: Secondary | ICD-10-CM | POA: Diagnosis not present

## 2022-07-22 DIAGNOSIS — Z9641 Presence of insulin pump (external) (internal): Secondary | ICD-10-CM | POA: Diagnosis present

## 2022-07-22 DIAGNOSIS — E1043 Type 1 diabetes mellitus with diabetic autonomic (poly)neuropathy: Secondary | ICD-10-CM | POA: Diagnosis present

## 2022-07-22 DIAGNOSIS — Z8674 Personal history of sudden cardiac arrest: Secondary | ICD-10-CM | POA: Diagnosis not present

## 2022-07-22 DIAGNOSIS — Z833 Family history of diabetes mellitus: Secondary | ICD-10-CM | POA: Diagnosis not present

## 2022-07-22 DIAGNOSIS — F32A Depression, unspecified: Secondary | ICD-10-CM | POA: Diagnosis present

## 2022-07-22 LAB — GLUCOSE, CAPILLARY
Glucose-Capillary: 180 mg/dL — ABNORMAL HIGH (ref 70–99)
Glucose-Capillary: 132 mg/dL — ABNORMAL HIGH (ref 70–99)

## 2022-07-22 LAB — TROPONIN I (HIGH SENSITIVITY)
Troponin I (High Sensitivity): 189 ng/L (ref <18)
Troponin I (High Sensitivity): 198 ng/L (ref <18)
Troponin I (High Sensitivity): 226 ng/L (ref <18)
Troponin I (High Sensitivity): 278 ng/L (ref <18)

## 2022-07-22 LAB — BRAIN NATRIURETIC PEPTIDE: B Natriuretic Peptide: 698.6 pg/mL — ABNORMAL HIGH (ref 0.0–100.0)

## 2022-07-22 LAB — BASIC METABOLIC PANEL
Anion gap: 7 (ref 5–15)
BUN: 25 mg/dL — ABNORMAL HIGH (ref 6–20)
CO2: 27 mmol/L (ref 22–32)
Calcium: 8.3 mg/dL — ABNORMAL LOW (ref 8.9–10.3)
Chloride: 108 mmol/L (ref 98–111)
Creatinine, Ser: 3.22 mg/dL — ABNORMAL HIGH (ref 0.44–1.00)
GFR, Estimated: 17 mL/min — ABNORMAL LOW (ref >60)
Glucose, Bld: 174 mg/dL — ABNORMAL HIGH (ref 70–99)
Potassium: 4.4 mmol/L (ref 3.5–5.1)
Sodium: 142 mmol/L (ref 135–145)

## 2022-07-22 LAB — PHOSPHORUS: Phosphorus: 4.8 mg/dL — ABNORMAL HIGH (ref 2.5–4.6)

## 2022-07-22 LAB — CBG MONITORING, ED
Glucose-Capillary: 250 mg/dL — ABNORMAL HIGH (ref 70–99)
Glucose-Capillary: 243 mg/dL — ABNORMAL HIGH (ref 70–99)
Glucose-Capillary: 108 mg/dL — ABNORMAL HIGH (ref 70–99)
Glucose-Capillary: 218 mg/dL — ABNORMAL HIGH (ref 70–99)

## 2022-07-22 LAB — LACTIC ACID, PLASMA: Lactic Acid, Venous: 1.4 mmol/L (ref 0.5–1.9)

## 2022-07-22 LAB — TSH: TSH: 2.439 u[IU]/mL (ref 0.350–4.500)

## 2022-07-22 LAB — MAGNESIUM: Magnesium: 1.8 mg/dL (ref 1.7–2.4)

## 2022-07-22 MED ORDER — LACTATED RINGERS IV SOLN: INTRAVENOUS | Status: DC

## 2022-07-22 MED ORDER — MAGNESIUM SULFATE 2 GM/50ML IV SOLN
2.0000 g | Freq: Once | INTRAVENOUS | Status: AC
  Administered 2022-07-22: 2 g via INTRAVENOUS
  Filled 2022-07-22: qty 50

## 2022-07-22 MED ORDER — ROSUVASTATIN CALCIUM 20 MG PO TABS
20.0000 mg | ORAL_TABLET | Freq: Every day | ORAL | Status: DC
  Administered 2022-07-23 – 2022-07-24 (×2): 20 mg via ORAL
  Filled 2022-07-22 (×2): qty 1

## 2022-07-22 MED ORDER — SODIUM CHLORIDE 0.9 % IV SOLN
12.5000 mg | Freq: Four times a day (QID) | INTRAVENOUS | Status: DC | PRN
  Filled 2022-07-22: qty 0.5

## 2022-07-22 MED ORDER — ACETAMINOPHEN 325 MG PO TABS
650.0000 mg | ORAL_TABLET | Freq: Four times a day (QID) | ORAL | Status: DC | PRN
  Administered 2022-07-24: 650 mg via ORAL
  Filled 2022-07-22: qty 2

## 2022-07-22 MED ORDER — LORAZEPAM 0.5 MG PO TABS: 0.5000 mg | ORAL_TABLET | Freq: Three times a day (TID) | ORAL | Status: DC | PRN

## 2022-07-22 MED ORDER — METOPROLOL SUCCINATE ER 25 MG PO TB24
25.0000 mg | ORAL_TABLET | Freq: Every day | ORAL | Status: DC
  Administered 2022-07-22 – 2022-07-24 (×3): 25 mg via ORAL
  Filled 2022-07-22 (×3): qty 1

## 2022-07-22 MED ORDER — INSULIN ASPART 100 UNIT/ML IJ SOLN
0.0000 [IU] | Freq: Three times a day (TID) | INTRAMUSCULAR | Status: DC
  Administered 2022-07-22: 5 [IU] via SUBCUTANEOUS
  Administered 2022-07-22: 2 [IU] via SUBCUTANEOUS
  Administered 2022-07-23: 3 [IU] via SUBCUTANEOUS
  Administered 2022-07-23: 8 [IU] via SUBCUTANEOUS
  Administered 2022-07-23: 2 [IU] via SUBCUTANEOUS
  Administered 2022-07-24: 5 [IU] via SUBCUTANEOUS

## 2022-07-22 MED ORDER — HYDRALAZINE HCL 25 MG PO TABS: 50.0000 mg | ORAL_TABLET | Freq: Three times a day (TID) | ORAL | Status: DC

## 2022-07-22 MED ORDER — LORAZEPAM 2 MG/ML IJ SOLN
0.5000 mg | INTRAMUSCULAR | Status: DC | PRN
  Administered 2022-07-22: 0.5 mg via INTRAVENOUS
  Filled 2022-07-22: qty 1

## 2022-07-22 MED ORDER — ACETAMINOPHEN 650 MG RE SUPP: 650.0000 mg | Freq: Four times a day (QID) | RECTAL | Status: DC | PRN

## 2022-07-22 MED ORDER — SCOPOLAMINE 1 MG/3DAYS TD PT72
1.0000 | MEDICATED_PATCH | TRANSDERMAL | Status: DC
  Administered 2022-07-23: 1.5 mg via TRANSDERMAL
  Filled 2022-07-22 (×2): qty 1

## 2022-07-22 MED ORDER — PANTOPRAZOLE SODIUM 40 MG IV SOLR
40.0000 mg | Freq: Two times a day (BID) | INTRAVENOUS | Status: DC
  Administered 2022-07-22 – 2022-07-23 (×3): 40 mg via INTRAVENOUS
  Filled 2022-07-22 (×5): qty 10

## 2022-07-22 MED ORDER — HYDRALAZINE HCL 50 MG PO TABS
50.0000 mg | ORAL_TABLET | Freq: Three times a day (TID) | ORAL | Status: DC
  Administered 2022-07-22 – 2022-07-24 (×5): 50 mg via ORAL
  Filled 2022-07-22 (×5): qty 1

## 2022-07-22 MED ORDER — HYDROMORPHONE HCL 1 MG/ML IJ SOLN
1.0000 mg | Freq: Once | INTRAMUSCULAR | Status: AC
  Administered 2022-07-22: 1 mg via INTRAVENOUS
  Filled 2022-07-22: qty 1

## 2022-07-22 MED ORDER — HYDRALAZINE HCL 20 MG/ML IJ SOLN: 10.0000 mg | Freq: Four times a day (QID) | INTRAMUSCULAR | Status: DC | PRN

## 2022-07-22 MED ORDER — METOCLOPRAMIDE HCL 5 MG/ML IJ SOLN
10.0000 mg | Freq: Three times a day (TID) | INTRAMUSCULAR | Status: DC
  Administered 2022-07-22 – 2022-07-23 (×4): 10 mg via INTRAVENOUS
  Filled 2022-07-22 (×4): qty 2

## 2022-07-22 MED ORDER — PROCHLORPERAZINE EDISYLATE 10 MG/2ML IJ SOLN: 10.0000 mg | INTRAMUSCULAR | Status: DC | PRN

## 2022-07-22 MED ORDER — HYDROMORPHONE HCL 1 MG/ML IJ SOLN
1.0000 mg | INTRAMUSCULAR | Status: DC | PRN
  Administered 2022-07-22: 1 mg via INTRAVENOUS
  Filled 2022-07-22: qty 1

## 2022-07-22 MED ORDER — MELATONIN 3 MG PO TABS
3.0000 mg | ORAL_TABLET | Freq: Every evening | ORAL | Status: DC | PRN
  Administered 2022-07-23: 3 mg via ORAL
  Filled 2022-07-22: qty 1

## 2022-07-22 NOTE — ED Provider Notes (Signed)
12:59 PM Discussed with Dr. Gasper Sells. Likely troponins are demand from her illness. We reviewed chart, CTA from 2 years ago.  No heparin at this time. Cards will consult on here when she gets to hospital. When I talked to her, she hasn't had chest pain since yesterday on presentation. For now will treat symptomatically and continue admission, especially since she just vomited. This appears to be her chronic gastroparesis.   Sherwood Gambler, MD 07/22/22 [phone]

## 2022-07-22 NOTE — H&P (Incomplete)
History and Physical      [name] ZSW:[id number] DOB: 1970-09-18 DOA: 07/21/2022  PCP: [name], PA  Patient coming from: home   I have personally briefly reviewed patient's old medical records in [place]  Chief Complaint: Nausea/vomiting  HPI: [name] is a 51 y.o. female with medical history significant for type 1 diabetes mellitus complicated by diabetic gastroparesis, chronic diastolic heart failure, cardiac arrest status post CPR status post ensuing ICD placement in September 2023 for secondary prevention , essential hypertension, stage IV CKD with baseline creatinine 2.7-3.2, anemia of chronic disease with baseline hemoglobin 8-11, who is admitted to [facility] on 07/21/2022 by way of transfer from [facility] emergency department for further evaluation and management of intractable nausea/vomiting after presenting from home to the lateral facility complaining of such.    The patient was recently hospitalized at [facility] from***for intractable nausea/vomiting associated with abdominal pain and chest discomfort.  She had a mildly elevated troponin at that time, peaking at 110, with most recent troponin noted to be 83, on 07/12/2022.  The symptoms were felt to be on the basis of her diabetic gastroparesis and she was subsequently discharged home on 07/17/2022, with plan that included pursuit of gastric emptying study.  From a day following discharge, 07/18/2022, gastric emptying study was attempted but only partially completed due to need for termination in the setting of active nausea/vomiting.   Over the next 3 days following failed attempt at gastric emptying study, the patient remained relatively asymptomatic before developing additional nausea on 07/21/2022.  Following recurrence of this, she is noted frequent episodes of nausea resulting in at least 4-5 daily episodes of nonbloody, nonbilious emesis over that time, With most recent episode  occurring while at [facility] earlier today.   She notes that this nausea/vomiting has been associated with very similar abdominal pain and chest discomfort relative to that which she is experiencing during most recent prior hospitalization.  The abdominal discomfort is epigastric, described as sharp, nonradiating, worsening with palpation over this area.  Not associate any diarrhea, melena, hematochezia.  Denies any recent subjective fever, chills, rigors, or generalized myalgias.  No recent trauma or travel.  No chest pain, she notes is also very similar to that which she experienced during most recent prior hospitalization, and is described as midline, sharp, nonradiating, worsening with the active vomiting, nonexertional, and not associate with any shortness of breath, palpitations, diaphoresis, dizziness, presyncopal, or syncope.  No hemoptysis.  Of note, prior workup for her intractable nausea/vomiting associate with epigastric discomfort included EGD performed on 05/23/2022.  She has a history of chronic diastolic heart failure, with most recent echocardiogram on 04/07/2022 notable for LVEF 50 to 55%, no focal motion arise, mild LVH, grade 2 diastolic dysfunction, normal right ventricular systolic function, severely dilated left atrium, no evidence of significant valvular pathology.  She is on prn torsemide as an outpatient, in the absence of any scheduled diuretic medications at home.  She is on multiple scheduled and as needed antiemetics at home, including scheduled scopolamine patch, scheduled hydroxyzine, as needed Compazine, prn for endocrine suppository, as needed Ativan.  However, in spite of this regimen, she noted persistence of her nausea/vomiting, and limited ability to tolerate p.o. as a consequence thereof, prompting her to ultimately present to [facility] emergency department for further evaluation management thereof.    ED Course:  Vital signs in the ED were notable for the  following: Afebrile; heart rates initially in the 120s, slowly improving  to low 100s following IV fluids and pain medication; systolic blood pressures initially in the 160s to 180s, associated increasing of the 140s to 150s following improvement in pain control; respiratory rate 1625, and oxygen saturation 96% on room air.  Labs were notable for the following: Initial CBG 123; CMP notable for sodium 143, potassium 4.2, bicarbonate 26, anion gap 12, creatinine 3.04, demonstrates prior value 3.32 on 07/17/2022, calcium, just from out of have anemia noted to be 9.5, albumin 3.6, otherwise liver enzymes found to be within normal limits.  Lipase less than 10.  Serum 90s and low 1.8.  CBC notable for [place] count 6900, hemoglobin 11.3 associated normocytic/recurrent properties as well as nonelevated RDW, which is relative to most recent prior hemoglobin value of 9.5 on 07/17/2022, platelet count 300.  Lactic acid 0.9.  Urinalysis notable for no blood cells, leukocyte esterase isolated negative and was negative for ketones.  Lipase less than 10.  High-sensitivity troponin I initially noted to be 189, followed by 198, then 226, then 270.   Per my interpretation, EKG in ED demonstrated the following: Sinus tachycardia with heart rate 117, normal intervals, nonspecific T wave inversion in leads III and V6, and no evidence ST changes, including no evidence of ST elevation.  Imaging and additional notable ED work-up: Plain films of the abdomen, per final radiology read, showed no evidence of acute intra-abdominal process, including no evidence of bowel obstruction or any free air under the diaphragm.  While in the ED, the following were administered: Benadryl 25 mg IV x 1, Reglan 10 mg IV x 3, Phenergan IV x 2 doses, hydralazine 10 mg IV x 1, morphine 4 mg IV x 1, Dilaudid 1 mg IV x 2 doses, Protonix 40 mg IV x 1 NovoLog 5 units subcu x 1 dose, lactated Ringer's x 2 L bolus followed by continuous LR running at 50  cc/h.  With the doses of morphine/Dilaudid, the patient experienced significant improvement in her chest discomfort/epigastric pain, but in spite of the above antiemetic interventions, continues to experience recurrent nausea/vomiting with associated inability to tolerate p.o.  Subsequently, the patient was admitted to med/tele floor at [facility] for further evaluation and management of intractable nausea/vomiting.  In the context of a history of cardiac arrest requiring CPR, status post ensuing ICD placement, with presenting chest pain and elevated troponin, cardiology, Dr.Nishan, Was formally consulted.  Cardiology feels that patient's elevated troponin in the absence of acute ischemic changes on EKG is likely on the basis of demand ischemia as a consequence of tachycardia in the setting of mild dehydration from nausea/vomiting.  Interrogation of ICD revealed no evidence of concerning arrhythmias. they also note a cardiac CTA in June 2021 with a calcium score 0, high negative predictive value for coronary artery disease.  Cardiology conveys that the patient is not a candidate for coronary angiography given her renal function.  Given the above, cardiology recommends refraining from additional trending of troponin values, and does not feel that updated echocardiogram is warranted at this time, as she recently underwent echo in September 2023, as further detailed above.  cards recommend continuation of home beta-blocker.     Review of Systems: As per HPI otherwise 10 point review of systems negative.   Past Medical History:  Diagnosis Date   Anemia    Anxiety    Arthritis    Asthma    CHF (congestive heart failure) ([place])    Depression    Diabetic ulcer of left foot ([place])  05/12/2013   Gastroparesis    Generalized abdominal pain    History of chicken pox    Loss of weight 12/02/2019   Migraines    Mood disorder (H[facility])    anxiety   Prurigo nodularis    with diabetic dermopathy   Type 1  diabetes, uncontrolled, with neuropathy    [place]   Ulcers of both lower legs ([place]) 02/20/2014    Past Surgical History:  Procedure Laterality Date   BIOPSY  08/11/2020   Procedure: BIOPSY;  Surgeon: Ladene Artist, MD;  Location: [facility] ENDOSCOPY;  Service: Endoscopy;;   BIOPSY  04/02/2022   Procedure: BIOPSY;  Surgeon: Jackquline Denmark, MD;  Location: [facility] ENDOSCOPY;  Service: Gastroenterology;;   ESOPHAGOGASTRODUODENOSCOPY (EGD) WITH PROPOFOL N/A 08/11/2020   Procedure: ESOPHAGOGASTRODUODENOSCOPY (EGD) WITH PROPOFOL;  Surgeon: Ladene Artist, MD;  Location: [facility] ENDOSCOPY;  Service: Endoscopy;  Laterality: N/A;   ESOPHAGOGASTRODUODENOSCOPY (EGD) WITH PROPOFOL N/A 04/02/2022   Procedure: ESOPHAGOGASTRODUODENOSCOPY (EGD) WITH PROPOFOL;  Surgeon: Jackquline Denmark, MD;  Location: [facility];  Service: Gastroenterology;  Laterality: N/A;   ESOPHAGOGASTRODUODENOSCOPY (EGD) WITH PROPOFOL N/A 05/23/2022   Procedure: ESOPHAGOGASTRODUODENOSCOPY (EGD) WITH PROPOFOL;  Surgeon: Lucilla Lame, MD;  Location: [facility] ENDOSCOPY;  Service: Endoscopy;  Laterality: N/A;   ICD IMPLANT N/A 04/09/2022   Procedure: ICD IMPLANT;  Surgeon: Constance Haw, MD;  Location: [place] CV LAB;  Service: Cardiovascular;  Laterality: N/A;   treadmill stress test  01/2013   WNL, low risk study    Social History:  reports that she has never smoked. She has never used smokeless tobacco. She reports that she does not currently use alcohol. She reports that she does not use drugs.   Allergies  Allergen Reactions   Atorvastatin Other (See Comments)    Dizziness    Dulaglutide Nausea And Vomiting and Other (See Comments)    Caused pancreatitis    Zofran [Ondansetron] Nausea And Vomiting and Other (See Comments)    Causes nausea vomiting to worsen / doesn't really work for patient.   Amoxicillin Itching and Rash   Lantus [Insulin Glargine] Itching and Rash   Miconazole Nitrate Nausea And Vomiting and Rash    Family History   Problem Relation Age of Onset   Diabetes Mother        type 2   Hypertension Mother    Thyroid disease Mother    Bipolar disorder Mother    Heart disease Mother    Calcium disorder Mother    Cancer Maternal Grandmother        Breast, stomach   Cancer Paternal Grandmother        stomach, lung (smoker)   Diabetes Paternal Grandmother    Diabetes Paternal Grandfather    Heart failure Sister    Diabetes Sister    Stroke Maternal Aunt    Cancer Maternal Uncle        prostate   CAD Maternal Aunt        stents   Cancer Maternal Aunt 64       ovarian    Family history reviewed and not pertinent    Prior to Admission medications   Medication Sig Start Date End Date Taking? Authorizing Provider  acetaminophen (TYLENOL) 500 MG tablet Take 1,000 mg by mouth 3 (three) times daily as needed for moderate pain.    [provider]  albuterol (PROAIR HFA) 108 (90 Base) MCG/ACT inhaler Inhale 2 puffs into the lungs every 6 (six) hours as needed for wheezing or shortness  of breath. 07/03/20   [name], PA-C  Carboxymethylcellul-Glycerin (LUBRICATING EYE DROPS OP) Place 1-2 drops into both eyes daily as needed (dry eyes).    [provider]  Continuous Blood Gluc Sensor (FREESTYLE LIBRE 2 SENSOR) MISC Inject 1 Device into the skin every 14 (fourteen) days. 03/20/20   [provider]  Ensure Max Protein (ENSURE MAX PROTEIN) LIQD Take 330 mLs (11 oz total) by mouth daily. Patient not taking: Reported on 07/12/2022 04/30/22   Modena Jansky, MD  hydrALAZINE (APRESOLINE) 50 MG tablet Take 1 tablet (50 mg total) by mouth 3 (three) times daily. 07/17/22   Domenic Polite, MD  hydrOXYzine (ATARAX) 50 MG tablet Take 50 mg by mouth in the morning, at noon, and at bedtime.    [provider]  LORazepam (ATIVAN) 0.5 MG tablet Take 1 tablet (0.5 mg total) by mouth every 8 (eight) hours as needed for anxiety (Refractory nausea and vomiting). 07/17/22   Domenic Polite, MD  metoCLOPramide (REGLAN) 10 MG tablet Take 1 tablet (10 mg total) by mouth 3 (three) times daily before meals. 07/17/22   Domenic Polite, MD  metoprolol succinate (TOPROL XL) 25 MG 24 hr tablet Take 1 tablet (25 mg total) by mouth daily. 04/19/22   [name], PA-C  Multiple Vitamin (MULTIVITAMIN WITH MINERALS) TABS tablet Take 1 tablet by mouth daily. Patient not taking: Reported on 07/12/2022 04/30/22   Modena Jansky, MD  pantoprazole (PROTONIX) 40 MG tablet Take 1 tablet (40 mg total) by mouth 2 (two) times daily before a meal. 05/24/22 07/23/22  Rai, Ripudeep K, MD  polyethylene glycol (MIRALAX / GLYCOLAX) 17 g packet Take 17 g by mouth daily as needed for moderate constipation. Patient not taking: Reported on 07/12/2022    [provider]  pregabalin (LYRICA) 50 MG capsule Take 50 mg by mouth 2 (two) times daily as needed (neuropathy). 12/05/21   [provider]  prochlorperazine (COMPAZINE) 10 MG tablet Take 1 tablet (10 mg total) by mouth every 6 (six) hours as needed for nausea or vomiting. 05/24/22   Rai, [name], MD  promethazine (PHENERGAN) 25 MG suppository Unwrap and place 1 suppository (25 mg total) rectally every 6 (six) hours as needed for nausea or vomiting. 07/17/22   Domenic Polite, MD  rosuvastatin (CRESTOR) 20 MG tablet Take 1 tablet (20 mg total) by mouth daily. 05/24/22 09/21/22  Rai, [name], MD  scopolamine (TRANSDERM-SCOP) 1 MG/3DAYS Place 1 patch (1.5 mg total) onto the skin every 3 (three) days. 05/26/22   Rai, Ripudeep K, MD  tobramycin (TOBREX) 0.3 % ophthalmic solution Place 1-2 drops into both eyes See admin instructions. Instill 1-2 drops into both eyes 3 times daily the day before, day of, and the day after every 6 weeks eye injections per patient 11/17/20   [provider]  torsemide (DEMADEX) 20 MG tablet Take 20 mg by mouth daily as needed (swelling). 12/25/21   [provider]     Objective     Physical Exam: Vitals:   07/22/22 1230 07/22/22 1431 07/22/22 1500 07/22/22 1825  BP: (!) 163/106  (!) 155/100 (!) 146/86  Pulse: 97  (!) 102 96  Resp: $Remo'17  14 18  'Lqech$ Temp:  98.9 F (37.2 C)  98.6 F (37 C)  TempSrc:  Oral  Oral  SpO2: 97%  96% 96%  Weight:      Height:        General: appears to be stated age; alert,  oriented Skin: warm, dry, no rash Head:  AT/[place] Mouth:  Oral mucosa membranes appear dry, normal dentition Neck: supple; trachea midline Heart:  RRR; did not appreciate any M/R/G Lungs: CTAB, did not appreciate any wheezes, rales, or rhonchi Abdomen: + BS; soft, ND, NT Vascular: 2+ pedal pulses b/l; 2+ radial pulses b/l Extremities: no peripheral edema, no muscle wasting Neuro: strength and sensation intact in upper and lower extremities b/l    Labs on Admission: I have personally reviewed following labs and imaging studies  CBC: Recent Labs  Lab 07/16/22 0034 07/17/22 0049 07/21/22 2052  WBC 7.0 5.8 6.9  NEUTROABS  --   --  5.2  HGB 9.0* 9.5* 11.3*  HCT 27.2* 28.8* 34.6*  MCV 85.3 85.7 86.1  PLT 269 265 709   Basic Metabolic Panel: Recent Labs  Lab 07/16/22 0034 07/17/22 0049 07/21/22 2052 07/22/22 0320  NA 138 139 143  --   K 3.7 4.0 4.2  --   CL 106 108 105  --   CO2 $Re'23 23 26  'Nkg$ --   GLUCOSE 169* 126* 193*  --   BUN 37* 32* 29*  --   CREATININE 3.15* 3.32* 3.04*  --   CALCIUM 7.8* 7.9* 9.1  --   MG  --   --   --  1.8  PHOS  --   --   --  4.8*   GFR: Estimated Creatinine Clearance: 25.3 mL/min (A) (by C-G formula based on SCr of 3.04 mg/dL (H)). Liver Function Tests: Recent Labs  Lab 07/21/22 2052  AST 12*  ALT 9  ALKPHOS 77  BILITOT 0.8  PROT 6.9  ALBUMIN 3.6   Recent Labs  Lab 07/21/22 2133  LIPASE <10*   No results for input(s): "AMMONIA" in the last 168 hours. Coagulation Profile: No results for input(s): "INR", "PROTIME" in the last 168 hours. Cardiac Enzymes: No results for input(s): "CKTOTAL", "CKMB",  "CKMBINDEX", "TROPONINI" in the last 168 hours. BNP (last 3 results) No results for input(s): "PROBNP" in the last 8760 hours. HbA1C: No results for input(s): "HGBA1C" in the last 72 hours. CBG: Recent Labs  Lab 07/22/22 0528 07/22/22 0850 07/22/22 1231 07/22/22 1709 07/22/22 1830  GLUCAP 250* 243* 108* 218* 180*   Lipid Profile: No results for input(s): "CHOL", "HDL", "LDLCALC", "TRIG", "CHOLHDL", "LDLDIRECT" in the last 72 hours. Thyroid Function Tests: No results for input(s): "TSH", "T4TOTAL", "FREET4", "T3FREE", "THYROIDAB" in the last 72 hours. Anemia Panel: No results for input(s): "VITAMINB12", "FOLATE", "FERRITIN", "TIBC", "IRON", "RETICCTPCT" in the last 72 hours. Urine analysis:    Component Value Date/Time   COLORURINE YELLOW 07/21/2022 2147   APPEARANCEUR CLEAR 07/21/2022 2147   LABSPEC 1.017 07/21/2022 2147   PHURINE 8.5 (H) 07/21/2022 2147   GLUCOSEU 100 (A) 07/21/2022 2147   HGBUR TRACE (A) 07/21/2022 2147   BILIRUBINUR NEGATIVE 07/21/2022 2147   BILIRUBINUR negative 08/30/2020 1631   KETONESUR NEGATIVE 07/21/2022 2147   PROTEINUR 100 (A) 07/21/2022 2147   UROBILINOGEN 0.2 08/30/2020 1631   UROBILINOGEN 1.0 08/11/2008 1745   NITRITE NEGATIVE 07/21/2022 2147   LEUKOCYTESUR NEGATIVE 07/21/2022 2147    Radiological Exams on Admission: DG Abdomen 1 View  Result Date: 07/21/2022 CLINICAL DATA:  Nausea and vomiting. EXAM: ABDOMEN - 1 VIEW COMPARISON:  Abdominal radiograph 05/21/2022 FINDINGS: Relative paucity of bowel gas in the upper abdomen. Scattered air in the colon in the right lower abdomen. Nonobstructive pattern. No free intraperitoneal air given the limits of the supine images. No radio-opaque calculi  or other significant radiographic abnormality are seen.Partially visualized AICD lead overlying the right ventricle. IMPRESSION: Nonobstructive bowel gas pattern. Electronically Signed   By: Ileana Roup M.D.   On: 07/21/2022 22:01      Assessment/Plan    Principal Problem:   Intractable nausea and vomiting   ***     (Start)  ***            ***             ***            ***            ***            ***             ***           ***           ***   ***  DVT prophylaxis: SCD's ***  Code Status: Full code*** Family Communication: none*** Disposition Plan: Per Rounding Team Consults called: none***;  Admission status: ***     I SPENT GREATER THAN 75 *** MINUTES IN CLINICAL CARE TIME/MEDICAL DECISION-MAKING IN COMPLETING THIS ADMISSION.      [place] DO [facility]  From [place]   07/22/2022, 7:59 PM   ***

## 2022-07-22 NOTE — ED Notes (Signed)
Notified Dr. Stark Jock of patient's troponin level 198.

## 2022-07-22 NOTE — ED Provider Notes (Signed)
  Physical Exam  BP (!) 167/99   Pulse (!) 119   Temp 98 F (36.7 C) (Oral)   Resp 20   Ht 6' (1.829 m)   Wt 81 kg   LMP 11/17/2021 (Approximate)   SpO2 93%   BMI 24.22 kg/m   Physical Exam Vitals and nursing note reviewed.  Constitutional:      General: She is not in acute distress.    Appearance: She is well-developed. She is not diaphoretic.     Comments: Patient is awake and alert, but appears uncomfortable and is actively retching/vomiting.  HENT:     Head: Normocephalic and atraumatic.  Cardiovascular:     Rate and Rhythm: Normal rate and regular rhythm.     Heart sounds: No murmur heard.    No friction rub. No gallop.  Pulmonary:     Effort: Pulmonary effort is normal. No respiratory distress.     Breath sounds: Normal breath sounds. No wheezing.  Abdominal:     General: Bowel sounds are normal. There is no distension.     Palpations: Abdomen is soft.     Tenderness: There is no abdominal tenderness.  Musculoskeletal:        General: Normal range of motion.     Cervical back: Normal range of motion and neck supple.  Skin:    General: Skin is warm and dry.  Neurological:     General: No focal deficit present.     Mental Status: She is alert and oriented to person, place, and time.     Procedures  Procedures  ED Course / MDM   Clinical Course as of 07/22/22 0222  Sun Jul 21, 2022  2107 BP(!): 188/139 Patient with BP 456 systolic in the room. She could not take any of her medications today including her antihypertensives due to vomiting/nausea. Will treat with IV hydralazine one dose.  [HN]  2108 Glucose-Capillary(!): 123 [HN]  2108 WBC: 6.9 [HN]  2108 Hemoglobin(!): 11.3 [HN]  2229 Lactic Acid, Venous: 0.9 [HN]  2229 Lipase(!): <10 [HN]  2229 DG Abdomen 1 View FINDINGS: Relative paucity of bowel gas in the upper abdomen. Scattered air in the colon in the right lower abdomen. Nonobstructive pattern. No free intraperitoneal air given the limits of the  supine images. No radio-opaque calculi or other significant radiographic abnormality are seen.Partially visualized AICD lead overlying the right ventricle.  IMPRESSION: Nonobstructive bowel gas pattern.   [HN]    Clinical Course User Index [HN] Audley Hose, MD   Care assumed from Dr. Mayra Neer at shift change.  Care signed out to me awaiting response to IV fluids and medications.  Patient continues to vomit and appears uncomfortable.  Her troponin has returned at 189, however this also occurred with her prior admission.  I am uncertain of the significance of the troponin elevation, but is likely related to renal function.  She is not describing chest pain.  Troponin to be trended.  I have discussed care with the hospitalist who agrees to admit.       Veryl Speak, MD 07/22/22 [phone]

## 2022-07-22 NOTE — Progress Notes (Signed)
Received Pt from ER. Pt is A/Ox4, BP Is slightly elevated refer to flowsheet). Fluids started, pt settled and VS taken. Messaged MD for diet, will pass on to nightshift. Pts skin is intact, A/Ox4, no complaints of pain. Cell phone at bedside.

## 2022-07-22 NOTE — ED Notes (Signed)
Dr. Stark Jock aware of elevated troponin.

## 2022-07-22 NOTE — Progress Notes (Addendum)
[name]  is a 51 y.o. female, with history of diabetes mellitus type 2, diabetic gastroparesis, diastolic HFpEF , ICD placement because of cardiac arrest requiring CPR, malnutrition moderate degree.  Presented to [facility] ER with complaints of cough, chest pain, nausea and vomiting.  Patient with recent admission for the same 07/12/2022 to 07/17/2022.  At time of discharge shows plans for outpatient follow-up for gastric emptying study.  This was done but incomplete due to emesis.  Per patient severe onset resumed last night.  She has had intractable nausea and vomiting.  Associated with abdominal and chest pains.  At [facility] she was given Phenergan twice, IV Benadryl, IV Dilaudid and morphine.  Patient continues to have nausea and vomiting.  Admission requested.  Patient's troponin is elevated to 189.  EKG without ischemic changes.  Chest pain is typical for patient's symptoms.  Similar troponin elevation last admission.  Thought to be secondary to GI symptoms.  Of note: Patient with an insulin pump

## 2022-07-22 NOTE — Consult Note (Signed)
CARDIOLOGY CONSULT NOTE       Patient ID: [name] MRN: [id number] DOB/AGE: 52-07-1971 52 y.o.  Admit date: 07/21/2022 Referring Physician: [facility] Primary Physician: [name], PA Primary Cardiologist: Tamala Julian -> [place] EP:  [facility] Reason for Consultation: Elevated troponin  Principal Problem:   Intractable nausea and vomiting   HPI:  51 y.o. transferred from [place] with chronic intractable nausea and vomiting with dehydration. She has lost 10 lbs since just July. Followed by [facility] but needing to be referred to tertiary center Grade 3 esophagitis on EGD 05/23/22  Vomited during gastric emptying study 07/18/22 non diagnostic. She is type 1 diabetic complicated by gastroparesis, neuropathy and renal failure CR is around 3  She has had VT arrest and has single chamber AICD No CAD with calcium score 0 on cardiac CTA 01/19/20 EF 50-55% on TTE 04/07/22 and no significant valve dx  She describes typical ER visit for inability to eat and nause/vomiting no chest pain No acute ECG changes AICD normal function with no d/c's.  She is not volume overloaded Troponin mildly elevated with no trend 189->198->226->278 Also mildly elevated 10 days ago at 110.  ECG shows no acute changes ST rate 115 nonspecific ST changes normal QT.   Has been on Reglan, protonix, compazine, phenergan and transdermal scopolomine for her GI issues and nausea No obstructive bowel gas pattern on plain films yesterday   ROS All other systems reviewed and negative except as noted above  Past Medical History:  Diagnosis Date   Anemia    Anxiety    Arthritis    Asthma    CHF (congestive heart failure) (HCC)    Depression    Diabetic ulcer of left foot ([place]) 05/12/2013   Gastroparesis    Generalized abdominal pain    History of chicken pox    Loss of weight 12/02/2019   Migraines    Mood disorder (HCC)    anxiety   Prurigo nodularis    with diabetic dermopathy   Type 1  diabetes, uncontrolled, with neuropathy    [place]   Ulcers of both lower legs ([place]) 02/20/2014    Family History  Problem Relation Age of Onset   Diabetes Mother        type 2   Hypertension Mother    Thyroid disease Mother    Bipolar disorder Mother    Heart disease Mother    Calcium disorder Mother    Cancer Maternal Grandmother        Breast, stomach   Cancer Paternal Grandmother        stomach, lung (smoker)   Diabetes Paternal Grandmother    Diabetes Paternal Grandfather    Heart failure Sister    Diabetes Sister    Stroke Maternal Aunt    Cancer Maternal Uncle        prostate   CAD Maternal Aunt        stents   Cancer Maternal Aunt 64       ovarian    Social History   Socioeconomic History   Marital status: Divorced    Spouse name: Not on file   Number of children: 2   Years of education: Not on file   Highest education level: Not on file  Occupational History   Occupation: Freight forwarder  Tobacco Use   Smoking status: Never   Smokeless tobacco: Never  Vaping Use   Vaping Use: Never used  Substance and Sexual Activity   Alcohol use: Not  Currently   Drug use: No   Sexual activity: Yes    Partners: Male    Birth control/protection: None  Other Topics Concern   Not on file  Social History Narrative   Caffeine use: daily   Occupation: cleans houses   Regular exercise: yes, active 5x/wk   Diet: good water, fruits/vegetables daily   Social Determinants of Health   Financial Resource Strain: Low Risk  (07/15/2022)   Overall Financial Resource Strain (CARDIA)    Difficulty of Paying Living Expenses: Not very hard  Food Insecurity: No Food Insecurity (07/22/2022)   Hunger Vital Sign    Worried About Running Out of Food in the Last Year: Never true    [place] in the Last Year: Never true  Recent Concern: Food Insecurity - Food Insecurity Present (04/23/2022)   Hunger Vital Sign    Worried About Running Out of Food in the Last Year: Sometimes true     Ran Out of Food in the Last Year: Sometimes true  Transportation Needs: No Transportation Needs (07/22/2022)   PRAPARE - Hydrologist (Medical): No    Lack of Transportation (Non-Medical): No  Physical Activity: Not on file  Stress: Not on file  Social Connections: Not on file  Intimate Partner Violence: Not At Risk (07/22/2022)   Humiliation, Afraid, Rape, and Kick questionnaire    Fear of Current or Ex-Partner: No    Emotionally Abused: No    Physically Abused: No    Sexually Abused: No    Past Surgical History:  Procedure Laterality Date   BIOPSY  08/11/2020   Procedure: BIOPSY;  Surgeon: Ladene Artist, MD;  Location: [facility];  Service: Endoscopy;;   BIOPSY  04/02/2022   Procedure: BIOPSY;  Surgeon: Jackquline Denmark, MD;  Location: [facility];  Service: Gastroenterology;;   ESOPHAGOGASTRODUODENOSCOPY (EGD) WITH PROPOFOL N/A 08/11/2020   Procedure: ESOPHAGOGASTRODUODENOSCOPY (EGD) WITH PROPOFOL;  Surgeon: Ladene Artist, MD;  Location: [facility];  Service: Endoscopy;  Laterality: N/A;   ESOPHAGOGASTRODUODENOSCOPY (EGD) WITH PROPOFOL N/A 04/02/2022   Procedure: ESOPHAGOGASTRODUODENOSCOPY (EGD) WITH PROPOFOL;  Surgeon: Jackquline Denmark, MD;  Location: [facility];  Service: Gastroenterology;  Laterality: N/A;   ESOPHAGOGASTRODUODENOSCOPY (EGD) WITH PROPOFOL N/A 05/23/2022   Procedure: ESOPHAGOGASTRODUODENOSCOPY (EGD) WITH PROPOFOL;  Surgeon: Lucilla Lame, MD;  Location: [facility];  Service: Endoscopy;  Laterality: N/A;   ICD IMPLANT N/A 04/09/2022   Procedure: ICD IMPLANT;  Surgeon: Constance Haw, MD;  Location: [facility];  Service: Cardiovascular;  Laterality: N/A;   treadmill stress test  01/2013   WNL, low risk study      Current Facility-Administered Medications:    hydrALAZINE (APRESOLINE) injection 10 mg, 10 mg, Intravenous, Q6H PRN, Crosley, Debby, MD   HYDROmorphone (DILAUDID) injection 1 mg, 1 mg, Intravenous, Q4H PRN,  Crosley, Debby, MD, 1 mg at 07/22/22 1030   insulin aspart (novoLOG) injection 0-15 Units, 0-15 Units, Subcutaneous, TID WC, Crosley, Debby, MD, 5 Units at 07/22/22 0922   lactated ringers infusion, , Intravenous, Continuous, Crosley, Debby, MD, Last Rate: 50 mL/hr at 07/22/22 1253, Restarted at 07/22/22 1253   LORazepam (ATIVAN) tablet 0.5 mg, 0.5 mg, Oral, Q8H PRN, Crosley, Debby, MD   metoCLOPramide (REGLAN) injection 10 mg, 10 mg, Intravenous, Q8H, Crosley, Debby, MD, 10 mg at 07/22/22 1436   metoprolol succinate (TOPROL-XL) 24 hr tablet 25 mg, 25 mg, Oral, Daily, Crosley, Debby, MD, 25 mg at 07/22/22 1030   pantoprazole (PROTONIX) injection 40  mg, 40 mg, Intravenous, Q12H, Crosley, Debby, MD, 40 mg at 07/22/22 0336   prochlorperazine (COMPAZINE) injection 10 mg, 10 mg, Intravenous, Q4H PRN, Crosley, Debby, MD   promethazine (PHENERGAN) 12.5 mg in sodium chloride 0.9 % 50 mL IVPB, 12.5 mg, Intravenous, Q6H PRN, Claria Dice, Debby, MD   [START ON 07/23/2022] scopolamine (TRANSDERM-SCOP) 1 MG/3DAYS 1.5 mg, 1 patch, Transdermal, Q72H, Crosley, Debby, MD  insulin aspart  0-15 Units Subcutaneous TID WC   metoCLOPramide (REGLAN) injection  10 mg Intravenous Q8H   metoprolol succinate  25 mg Oral Daily   pantoprazole (PROTONIX) IV  40 mg Intravenous Q12H   [START ON 07/23/2022] scopolamine  1 patch Transdermal Q72H    lactated ringers 50 mL/hr at 07/22/22 1253   promethazine (PHENERGAN) injection (IM or IVPB)      Physical Exam: Blood pressure (!) 146/86, pulse 96, temperature 98.6 F (37 C), temperature source Oral, resp. rate 18, height 6' (1.829 m), weight 81 kg, last menstrual period 11/17/2021, SpO2 96 %.   Chronically ill black female Thin no volume overload JVP normal  No murmur  Abdomen mildly tense from vomiting no rebound No edema  Labs:   Lab Results  Component Value Date   WBC 6.9 07/21/2022   HGB 11.3 (L) 07/21/2022   HCT 34.6 (L) 07/21/2022   MCV 86.1 07/21/2022   PLT  300 07/21/2022    Recent Labs  Lab 07/21/22 2052  NA 143  K 4.2  CL 105  CO2 26  BUN 29*  CREATININE 3.04*  CALCIUM 9.1  PROT 6.9  BILITOT 0.8  ALKPHOS 77  ALT 9  AST 12*  GLUCOSE 193*   No results found for: "CKTOTAL", "CKMB", "CKMBINDEX", "TROPONINI"  Lab Results  Component Value Date   CHOL 205 (H) 01/31/2020   CHOL 242 (H) 12/03/2019   CHOL 228 (H) 10/12/2019   Lab Results  Component Value Date   HDL 73 01/31/2020   HDL 83 12/03/2019   HDL 44 10/12/2019   Lab Results  Component Value Date   LDLCALC 119 (H) 01/31/2020   LDLCALC 145 (H) 12/03/2019   LDLCALC 168 (H) 10/12/2019   Lab Results  Component Value Date   TRIG 70 01/31/2020   TRIG 85 12/03/2019   TRIG 81 10/12/2019   Lab Results  Component Value Date   CHOLHDL 2.9 12/03/2019   CHOLHDL 5.2 10/12/2019   CHOLHDL 3 09/08/2013   Lab Results  Component Value Date   LDLDIRECT 104.1 04/12/2014      Radiology: DG Abdomen 1 View  Result Date: 07/21/2022 CLINICAL DATA:  Nausea and vomiting. EXAM: ABDOMEN - 1 VIEW COMPARISON:  Abdominal radiograph 05/21/2022 FINDINGS: Relative paucity of bowel gas in the upper abdomen. Scattered air in the colon in the right lower abdomen. Nonobstructive pattern. No free intraperitoneal air given the limits of the supine images. No radio-opaque calculi or other significant radiographic abnormality are seen.Partially visualized AICD lead overlying the right ventricle. IMPRESSION: Nonobstructive bowel gas pattern. Electronically Signed   By: Ileana Roup M.D.   On: 07/21/2022 22:01   NM GASTRIC EMPTYING  Result Date: 07/18/2022 CLINICAL DATA:  Concern for gastroparesis. EXAM: NUCLEAR MEDICINE GASTRIC EMPTYING SCAN TECHNIQUE: After oral ingestion of radiolabeled meal, sequential abdominal images were not obtained secondary to patient emesis. RADIOPHARMACEUTICALS:  2.1 mCi Tc-45m sulfur colloid in standardized meal COMPARISON:  None Available. FINDINGS: A single scintigraphic  image was obtained post ingestion of radiolabeled meal. However no sequential abdominal images were obtained secondary to  patient emesis. IMPRESSION: Incomplete examination secondary to patient emesis. Electronically Signed   By: Dahlia Bailiff M.D.   On: 07/18/2022 12:03   NM Pulmonary Perfusion  Result Date: 07/12/2022 CLINICAL DATA:  Shortness of breath for 1 day, cough, history MI, asthma, CHF, clinically suspicious for pulmonary embolism EXAM: NUCLEAR MEDICINE PERFUSION LUNG SCAN TECHNIQUE: Perfusion images were obtained in multiple projections after intravenous injection of radiopharmaceutical. Ventilation scans intentionally deferred if perfusion scan and chest x-ray adequate for interpretation during COVID 19 epidemic. RADIOPHARMACEUTICALS:  4.0 mCi Tc-83m MAA IV COMPARISON:  Chest radiograph 07/12/2022 FINDINGS: Flattening of the posterior aspects of the lungs on lateral views, likely representing BILATERAL pleural effusions. Otherwise normal perfusion lung scan. No segmental or subsegmental perfusion defects identified to suggest pulmonary embolism. IMPRESSION: Pulmonary embolism absent. BILATERAL pleural effusions. Electronically Signed   By: Lavonia Dana M.D.   On: 07/12/2022 12:32   DG Chest Port 1 View  Result Date: 07/12/2022 CLINICAL DATA:  Worsening shortness of breath EXAM: PORTABLE CHEST 1 VIEW COMPARISON:  Chest radiograph 07/12/2022 FINDINGS: Single lead AICD device overlies the left hemithorax. Monitoring leads overlie the patient. Stable cardiomegaly. Small bilateral pleural effusions and underlying consolidation. Pulmonary vascular redistribution. No pneumothorax. IMPRESSION: 1. Cardiomegaly with pulmonary vascular congestion. 2. Small bilateral pleural effusions with underlying consolidation. Electronically Signed   By: Lovey Newcomer M.D.   On: 07/12/2022 09:39   DG Chest Port 1 View  Result Date: 07/12/2022 CLINICAL DATA:  Cough and chest pain. EXAM: PORTABLE CHEST 1 VIEW  COMPARISON:  06/19/2022 FINDINGS: 0624 hours. The cardio pericardial silhouette is enlarged. There is pulmonary vascular congestion without overt pulmonary edema. Bibasilar atelectasis or infiltrate noted with small bilateral pleural effusions. Left-sided AICD again noted. Telemetry leads overlie the chest. IMPRESSION: Enlargement of the cardiopericardial silhouette with bibasilar atelectasis or infiltrate and small bilateral pleural effusions. Electronically Signed   By: Misty Stanley M.D.   On: 07/12/2022 07:00    EKG: See HPI   ASSESSMENT AND PLAN:   Elevated Troponin:  mild with non specific pattern in setting of no chest pain or acute ECG changes Cardiac CTA 01/19/20 with calcium score 0 no CAD. No need for further cardiac w/u Rx with beta blocker for possible demand ischemia and tachycardia. Not a cath candidate with Cr 3. Can consider repeat TTE but just had one in September  AICD:  normal function no d/c Follow QT with nausea medicine  Gastroparesis:  Inpatient GI consult Apparantly [facility] has told her she needs tertiary referral KUB no obstruction and normal WBC and lactic acid Tachycardia:  hydrate increae Toprol to 50 mg daily  DM:  A1c 6.4  was as high as 14.4 2 years ago per primary service    Signed: Jenkins Rouge 07/22/2022, 6:52 PM

## 2022-07-22 NOTE — Inpatient Diabetes Management (Addendum)
Inpatient Diabetes Program Recommendations  AACE/ADA: New Consensus Statement on Inpatient Glycemic Control (2015)  Target Ranges:  Prepandial:   less than 140 mg/dL      Peak postprandial:   less than 180 mg/dL (1-2 hours)      Critically ill patients:  140 - 180 mg/dL   Lab Results  Component Value Date   GLUCAP 243 (H) 07/22/2022   HGBA1C 6.4 (H) 07/12/2022    Latest Reference Range & Units Most Recent 07/22/22 05:28 07/22/22 08:50  Glucose-Capillary 70 - 99 mg/dL 243 (H) 07/22/22 08:50 250 (H) 243 (H)  (H): Data is abnormally high   Review of Glycemic Control  Diabetes history: Type 1 DM (requires basal, carb coverage, and correction insulin)  Outpatient Diabetes medications:  OmniPod insulin pump with Novolog; Tresiba 50 units daily if not using pump; FreeStyle Libre3 CGM  Current orders for Inpatient glycemic control: Novolog 0-15 TID  Will likely need part of basal insulin.  Inpatient Diabetes Program Recommendations:    Consider adding Semglee 15 units QD  Change Novolog to 0-15 Q4H while NPO  Will follow for glucose trends.  Thank you. Lorenda Peck, RD, LDN, [place] Inpatient Diabetes Coordinator [phone]

## 2022-07-23 DIAGNOSIS — I10 Essential (primary) hypertension: Secondary | ICD-10-CM | POA: Diagnosis not present

## 2022-07-23 DIAGNOSIS — R112 Nausea with vomiting, unspecified: Secondary | ICD-10-CM

## 2022-07-23 DIAGNOSIS — E1143 Type 2 diabetes mellitus with diabetic autonomic (poly)neuropathy: Secondary | ICD-10-CM | POA: Diagnosis not present

## 2022-07-23 DIAGNOSIS — N184 Chronic kidney disease, stage 4 (severe): Secondary | ICD-10-CM | POA: Diagnosis present

## 2022-07-23 DIAGNOSIS — E1022 Type 1 diabetes mellitus with diabetic chronic kidney disease: Secondary | ICD-10-CM

## 2022-07-23 DIAGNOSIS — R7989 Other specified abnormal findings of blood chemistry: Secondary | ICD-10-CM | POA: Diagnosis not present

## 2022-07-23 DIAGNOSIS — N186 End stage renal disease: Secondary | ICD-10-CM | POA: Diagnosis present

## 2022-07-23 DIAGNOSIS — N1832 Chronic kidney disease, stage 3b: Secondary | ICD-10-CM

## 2022-07-23 DIAGNOSIS — K3184 Gastroparesis: Secondary | ICD-10-CM

## 2022-07-23 DIAGNOSIS — I5032 Chronic diastolic (congestive) heart failure: Secondary | ICD-10-CM

## 2022-07-23 LAB — GLUCOSE, CAPILLARY
Glucose-Capillary: 107 mg/dL — ABNORMAL HIGH (ref 70–99)
Glucose-Capillary: 130 mg/dL — ABNORMAL HIGH (ref 70–99)
Glucose-Capillary: 164 mg/dL — ABNORMAL HIGH (ref 70–99)
Glucose-Capillary: 182 mg/dL — ABNORMAL HIGH (ref 70–99)
Glucose-Capillary: 243 mg/dL — ABNORMAL HIGH (ref 70–99)
Glucose-Capillary: 259 mg/dL — ABNORMAL HIGH (ref 70–99)

## 2022-07-23 LAB — COMPREHENSIVE METABOLIC PANEL
ALT: 10 U/L (ref 0–44)
AST: 11 U/L — ABNORMAL LOW (ref 15–41)
Albumin: 2.1 g/dL — ABNORMAL LOW (ref 3.5–5.0)
Alkaline Phosphatase: 60 U/L (ref 38–126)
Anion gap: 7 (ref 5–15)
BUN: 25 mg/dL — ABNORMAL HIGH (ref 6–20)
CO2: 26 mmol/L (ref 22–32)
Calcium: 8.2 mg/dL — ABNORMAL LOW (ref 8.9–10.3)
Chloride: 107 mmol/L (ref 98–111)
Creatinine, Ser: 3.37 mg/dL — ABNORMAL HIGH (ref 0.44–1.00)
GFR, Estimated: 16 mL/min — ABNORMAL LOW (ref >60)
Glucose, Bld: 177 mg/dL — ABNORMAL HIGH (ref 70–99)
Potassium: 4.5 mmol/L (ref 3.5–5.1)
Sodium: 140 mmol/L (ref 135–145)
Total Bilirubin: 1 mg/dL (ref 0.3–1.2)
Total Protein: 4.7 g/dL — ABNORMAL LOW (ref 6.5–8.1)

## 2022-07-23 LAB — CBC WITH DIFFERENTIAL/PLATELET
Abs Immature Granulocytes: 0.01 10*3/uL (ref 0.00–0.07)
Basophils Absolute: 0 10*3/uL (ref 0.0–0.1)
Basophils Relative: 1 %
Eosinophils Absolute: 0.1 10*3/uL (ref 0.0–0.5)
Eosinophils Relative: 3 %
HCT: 26.6 % — ABNORMAL LOW (ref 36.0–46.0)
Hemoglobin: 8.6 g/dL — ABNORMAL LOW (ref 12.0–15.0)
Immature Granulocytes: 0 %
Lymphocytes Relative: 35 %
Lymphs Abs: 1.8 10*3/uL (ref 0.7–4.0)
MCH: 28.8 pg (ref 26.0–34.0)
MCHC: 32.3 g/dL (ref 30.0–36.0)
MCV: 89 fL (ref 80.0–100.0)
Monocytes Absolute: 0.4 10*3/uL (ref 0.1–1.0)
Monocytes Relative: 7 %
Neutro Abs: 2.8 10*3/uL (ref 1.7–7.7)
Neutrophils Relative %: 54 %
Platelets: 226 10*3/uL (ref 150–400)
RBC: 2.99 MIL/uL — ABNORMAL LOW (ref 3.87–5.11)
RDW: 15.8 % — ABNORMAL HIGH (ref 11.5–15.5)
WBC: 5.1 10*3/uL (ref 4.0–10.5)
nRBC: 0 % (ref 0.0–0.2)

## 2022-07-23 LAB — MAGNESIUM: Magnesium: 1.9 mg/dL (ref 1.7–2.4)

## 2022-07-23 MED ORDER — METOCLOPRAMIDE HCL 5 MG PO TABS
10.0000 mg | ORAL_TABLET | Freq: Three times a day (TID) | ORAL | Status: DC
  Administered 2022-07-23 – 2022-07-24 (×3): 10 mg via ORAL
  Filled 2022-07-23 (×3): qty 2

## 2022-07-23 MED ORDER — INSULIN DETEMIR 100 UNIT/ML ~~LOC~~ SOLN
7.0000 [IU] | Freq: Two times a day (BID) | SUBCUTANEOUS | Status: DC
  Administered 2022-07-23 – 2022-07-24 (×2): 7 [IU] via SUBCUTANEOUS
  Filled 2022-07-23 (×3): qty 0.07

## 2022-07-23 NOTE — Progress Notes (Signed)
Rounding Note    Patient Name: [name] Date of Encounter: 07/23/2022  [place] Cardiologist: Sinclair Grooms, MD   Subjective   Feels well. Now able to eat.   Inpatient Medications    Scheduled Meds:  hydrALAZINE  50 mg Oral Q8H   insulin aspart  0-15 Units Subcutaneous TID WC   metoCLOPramide  10 mg Oral TID AC   metoprolol succinate  25 mg Oral Daily   pantoprazole (PROTONIX) IV  40 mg Intravenous Q12H   rosuvastatin  20 mg Oral Daily   scopolamine  1 patch Transdermal Q72H   Continuous Infusions:  lactated ringers 50 mL/hr at 07/22/22 1937   promethazine (PHENERGAN) injection (IM or IVPB)     PRN Meds: acetaminophen **OR** acetaminophen, hydrALAZINE, HYDROmorphone (DILAUDID) injection, LORazepam, melatonin, prochlorperazine, promethazine (PHENERGAN) injection (IM or IVPB)   Vital Signs    Vitals:   07/22/22 1825 07/23/22 0539 07/23/22 0800 07/23/22 0933  BP: (!) 146/86 131/81 135/83 113/63  Pulse: 96 94 92 91  Resp: $Remo'18 18  18  'kVHWF$ Temp: 98.6 F (37 C) 98.2 F (36.8 C)  98.7 F (37.1 C)  TempSrc: Oral Oral  Oral  SpO2: 96% 98% 99% 99%  Weight:      Height:        Intake/Output Summary (Last 24 hours) at 07/23/2022 1246 Last data filed at 07/23/2022 0200 Gross per 24 hour  Intake 1489.73 ml  Output 900 ml  Net 589.73 ml      07/21/2022    7:38 PM 07/17/2022    1:33 AM 07/14/2022   12:05 AM  Last 3 Weights  Weight (lbs) 178 lb 9.2 oz 178 lb 9.2 oz 178 lb 12.7 oz  Weight (kg) 81 kg 81 kg 81.1 kg      Telemetry    NSR - Personally Reviewed  ECG      Physical Exam   GEN: No acute distress.   Neck: No JVD Cardiac: RRR, no murmurs, rubs, or gallops.  Respiratory: Clear to auscultation bilaterally. GI: Soft, nontender, non-distended  MS: No edema; No deformity. Neuro:  Nonfocal  Psych: Normal affect   Labs    High Sensitivity Troponin:   Recent Labs  Lab 07/12/22 1745 07/21/22 2344 07/22/22 0320 07/22/22 0632  07/22/22 1038  TROPONINIHS 83* 189* 198* 226* 278*     Chemistry Recent Labs  Lab 07/21/22 2052 07/22/22 0320 07/22/22 2121 07/23/22 0340  NA 143  --  142 140  K 4.2  --  4.4 4.5  CL 105  --  108 107  CO2 26  --  27 26  GLUCOSE 193*  --  174* 177*  BUN 29*  --  25* 25*  CREATININE 3.04*  --  3.22* 3.37*  CALCIUM 9.1  --  8.3* 8.2*  MG  --  1.8  --  1.9  PROT 6.9  --   --  4.7*  ALBUMIN 3.6  --   --  2.1*  AST 12*  --   --  11*  ALT 9  --   --  10  ALKPHOS 77  --   --  60  BILITOT 0.8  --   --  1.0  GFRNONAA 18*  --  17* 16*  ANIONGAP 12  --  7 7    Lipids No results for input(s): "CHOL", "TRIG", "HDL", "LABVLDL", "LDLCALC", "CHOLHDL" in the last 168 hours.  Hematology Recent Labs  Lab 07/17/22 0049 07/21/22 2052 07/23/22 0340  WBC 5.8 6.9 5.1  RBC 3.36* 4.02 2.99*  HGB 9.5* 11.3* 8.6*  HCT 28.8* 34.6* 26.6*  MCV 85.7 86.1 89.0  MCH 28.3 28.1 28.8  MCHC 33.0 32.7 32.3  RDW 15.3 15.4 15.8*  PLT 265 300 226   Thyroid  Recent Labs  Lab 07/22/22 2121  TSH 2.439    BNP Recent Labs  Lab 07/22/22 2121  BNP 698.6*    DDimer No results for input(s): "DDIMER" in the last 168 hours.   Radiology    DG Chest Port 1 View  Result Date: 07/22/2022 CLINICAL DATA:  Elevated troponin EXAM: PORTABLE CHEST 1 VIEW COMPARISON:  07/12/2022 FINDINGS: Left-sided pacing device as before. Cardiomegaly with slight central congestion but no overt edema. No pleural effusion, pneumothorax or focal airspace disease. IMPRESSION: Cardiomegaly with slight central congestion. Electronically Signed   By: Donavan Foil M.D.   On: 07/22/2022 21:26   DG Abdomen 1 View  Result Date: 07/21/2022 CLINICAL DATA:  Nausea and vomiting. EXAM: ABDOMEN - 1 VIEW COMPARISON:  Abdominal radiograph 05/21/2022 FINDINGS: Relative paucity of bowel gas in the upper abdomen. Scattered air in the colon in the right lower abdomen. Nonobstructive pattern. No free intraperitoneal air given the limits of the  supine images. No radio-opaque calculi or other significant radiographic abnormality are seen.Partially visualized AICD lead overlying the right ventricle. IMPRESSION: Nonobstructive bowel gas pattern. Electronically Signed   By: Ileana Roup M.D.   On: 07/21/2022 22:01    Cardiac Studies   Normal EF on most recent echo.    Patient Profile     51 y.o. female with elevated troponin  Assessment & Plan    No CP or CHF.  Continue medical therapy.  No further cardiac testing needed.  [place] will sign off.   Medication Recommendations:  none Other recommendations (labs, testing, etc):  none Follow up as an outpatient:  as scheduled  For questions or updates, please contact [place] Please consult [email or web address] for contact info under        Signed, Larae Grooms, MD  07/23/2022, 12:46 PM

## 2022-07-23 NOTE — Progress Notes (Addendum)
This nurse assessed for PIV placement with [place]. Very poor vasculature d/t frequent restarts and infiltration. No appropriate vein found at this time. See flowsheet. The patient states "MD said I didn't need fluids anymore and can do orals". This nurse notified primary nurse, Verdene Lennert, LPN and requested she follow up with MD. Fran Lowes, RN VAST

## 2022-07-23 NOTE — Progress Notes (Signed)
Heart Failure Nurse Navigator Progress Note   Patient here for nausea and vomiting, EF 50-55% patient already set up with HF TOC on 08/06/2022.  Earnestine Leys, BSN, Clinical cytogeneticist Only

## 2022-07-23 NOTE — Progress Notes (Signed)
PROGRESS NOTE    [name]  HAL:[id number] DOB: 08/06/1970 DOA: 07/21/2022 PCP: [name], PA   Brief Narrative: [name] is a 51 y.o. female with a history of diabetes mellitus type 1, diabetic gastroparesis, chronic diastolic heart failure, cardiac arrest s/p CPR and ICD placement, primary hypertension, CKD stage IV, anemia of chronic disease. Patient presented secondary secondary to nausea/vomiting consistent with gastroparesis flare. She had associated abdominal and chest pain, likely related to recurrent emesis, but also had associated elevated troponin; cardiology consulted. Nausea/vomiting improved with IV Reglan.   Assessment and Plan:  Gastroparesis flare Patient with associated acute nausea/vomiting. Patient started on Reglan IV with improvement of symptoms. Tolerating diet this morning. -Transition to Reglan PO -Continue diet  Epigastric pain Secondary to recurrent emesis. Improved.  Demand ischemia Likely etiology for elevated troponin. Cardiology consulted and recommend no continued inpatient workup.  Diabetes mellitus type 1 Patient manages with an insulin pump as an outpatient.  Chronic diastolic heart failure Stable. -Continue metoprolol  Primary hypertension -Continue hydralazine and metoprolol  CKD stage IV Stable. Patient plans to follow-up with nephrology as an outpatient.  Anemia of chronic disease Baseline hemoglobin of around 9. Hemoglobin of 11.3 on admission, likely secondary to hemoconcentration. Hemoglobin down to 8.6 today. No history of bleeding.   DVT prophylaxis: SCDs Code Status:   Code Status: Full Code Family Communication: None at bedside Disposition Plan: Discharge home likely in 24 hours if tolerating oral while on Reglan PO   Consultants:  Cardiology  Procedures: None  Antimicrobials: None    Subjective: Patient reports improvement in symptoms. No nausea/vomiting this morning. No abdominal  pain.  Objective: BP 113/63 (BP Location: Left Arm)   Pulse 91   Temp 98.7 F (37.1 C) (Oral)   Resp 18   Ht 6' (1.829 m)   Wt 81 kg   LMP 11/17/2021 (Approximate)   SpO2 99%   BMI 24.22 kg/m   Examination:  General exam: Appears calm and comfortable Respiratory system: Clear to auscultation. Respiratory effort normal. Cardiovascular system: S1 & S2 heard, RRR. Gastrointestinal system: Abdomen is nondistended, soft and nontender. No organomegaly or masses felt. Normal bowel sounds heard. Central nervous system: Alert and oriented. No focal neurological deficits. Musculoskeletal: No edema. No calf tenderness Skin: No cyanosis. No rashes Psychiatry: Judgement and insight appear normal. Mood & affect appropriate.    Data Reviewed: I have personally reviewed following labs and imaging studies  CBC Lab Results  Component Value Date   WBC 5.1 07/23/2022   RBC 2.99 (L) 07/23/2022   HGB 8.6 (L) 07/23/2022   HCT 26.6 (L) 07/23/2022   MCV 89.0 07/23/2022   MCH 28.8 07/23/2022   PLT 226 07/23/2022   MCHC 32.3 07/23/2022   RDW 15.8 (H) 07/23/2022   LYMPHSABS 1.8 07/23/2022   MONOABS 0.4 07/23/2022   EOSABS 0.1 07/23/2022   BASOSABS 0.0 73/53/2992     Last metabolic panel Lab Results  Component Value Date   NA 140 07/23/2022   K 4.5 07/23/2022   CL 107 07/23/2022   CO2 26 07/23/2022   BUN 25 (H) 07/23/2022   CREATININE 3.37 (H) 07/23/2022   GLUCOSE 177 (H) 07/23/2022   GFRNONAA 16 (L) 07/23/2022   GFRAA 102 01/31/2020   CALCIUM 8.2 (L) 07/23/2022   PHOS 4.8 (H) 07/22/2022   PROT 4.7 (L) 07/23/2022   ALBUMIN 2.1 (L) 07/23/2022   LABGLOB 2.5 11/24/2019   AGRATIO 1.2 11/24/2019   BILITOT 1.0 07/23/2022   ALKPHOS  60 07/23/2022   AST 11 (L) 07/23/2022   ALT 10 07/23/2022   ANIONGAP 7 07/23/2022    GFR: Estimated Creatinine Clearance: 22.8 mL/min (A) (by C-G formula based on SCr of 3.37 mg/dL (H)).  No results found for this or any previous visit (from the  past 240 hour(s)).    Radiology Studies: DG Chest Port 1 View  Result Date: 07/22/2022 CLINICAL DATA:  Elevated troponin EXAM: PORTABLE CHEST 1 VIEW COMPARISON:  07/12/2022 FINDINGS: Left-sided pacing device as before. Cardiomegaly with slight central congestion but no overt edema. No pleural effusion, pneumothorax or focal airspace disease. IMPRESSION: Cardiomegaly with slight central congestion. Electronically Signed   By: Donavan Foil M.D.   On: 07/22/2022 21:26   DG Abdomen 1 View  Result Date: 07/21/2022 CLINICAL DATA:  Nausea and vomiting. EXAM: ABDOMEN - 1 VIEW COMPARISON:  Abdominal radiograph 05/21/2022 FINDINGS: Relative paucity of bowel gas in the upper abdomen. Scattered air in the colon in the right lower abdomen. Nonobstructive pattern. No free intraperitoneal air given the limits of the supine images. No radio-opaque calculi or other significant radiographic abnormality are seen.Partially visualized AICD lead overlying the right ventricle. IMPRESSION: Nonobstructive bowel gas pattern. Electronically Signed   By: Ileana Roup M.D.   On: 07/21/2022 22:01      LOS: 1 day    Cordelia Poche, MD [facility] 07/23/2022, 9:40 AM   If 7PM-7AM, please contact night-coverage [email or web address]

## 2022-07-23 NOTE — Hospital Course (Signed)
[name] is a 51 y.o. female with a history of diabetes mellitus type 1, diabetic gastroparesis, chronic diastolic heart failure, cardiac arrest s/p CPR and ICD placement, primary hypertension, CKD stage IV, anemia of chronic disease. Patient presented secondary secondary to nausea/vomiting consistent with gastroparesis flare. She had associated abdominal and chest pain, likely related to recurrent emesis, but also had associated elevated troponin; cardiology consulted. Nausea/vomiting improved with IV Reglan.

## 2022-07-23 NOTE — Progress Notes (Signed)
Dr. Claria Dice was notified via chat that patients veins are poor and suggest to leave out patient is expected to be discharged in am. Patient is eating and drinking ok

## 2022-07-24 DIAGNOSIS — E1022 Type 1 diabetes mellitus with diabetic chronic kidney disease: Secondary | ICD-10-CM | POA: Diagnosis not present

## 2022-07-24 DIAGNOSIS — R112 Nausea with vomiting, unspecified: Secondary | ICD-10-CM | POA: Diagnosis not present

## 2022-07-24 DIAGNOSIS — E1043 Type 1 diabetes mellitus with diabetic autonomic (poly)neuropathy: Principal | ICD-10-CM

## 2022-07-24 DIAGNOSIS — E1143 Type 2 diabetes mellitus with diabetic autonomic (poly)neuropathy: Secondary | ICD-10-CM | POA: Diagnosis not present

## 2022-07-24 DIAGNOSIS — K3184 Gastroparesis: Secondary | ICD-10-CM | POA: Diagnosis not present

## 2022-07-24 LAB — GLUCOSE, CAPILLARY
Glucose-Capillary: 274 mg/dL — ABNORMAL HIGH (ref 70–99)
Glucose-Capillary: 222 mg/dL — ABNORMAL HIGH (ref 70–99)

## 2022-07-24 MED ORDER — PANTOPRAZOLE SODIUM 40 MG PO TBEC
40.0000 mg | DELAYED_RELEASE_TABLET | Freq: Every day | ORAL | Status: DC
  Administered 2022-07-24: 40 mg via ORAL
  Filled 2022-07-24: qty 1

## 2022-07-24 MED ORDER — HYDROXYZINE HCL 25 MG PO TABS
50.0000 mg | ORAL_TABLET | Freq: Four times a day (QID) | ORAL | Status: DC | PRN
  Administered 2022-07-24: 50 mg via ORAL
  Filled 2022-07-24: qty 2

## 2022-07-24 NOTE — Plan of Care (Signed)
  Problem: Education: Goal: Ability to describe self-care measures that may prevent or decrease complications (Diabetes Survival Skills Education) will improve Outcome: Completed/Met   Problem: Coping: Goal: Ability to adjust to condition or change in health will improve Outcome: Completed/Met   Problem: Fluid Volume: Goal: Ability to maintain a balanced intake and output will improve Outcome: Completed/Met   Problem: Health Behavior/Discharge Planning: Goal: Ability to identify and utilize available resources and services will improve Outcome: Completed/Met Goal: Ability to manage health-related needs will improve Outcome: Completed/Met   Problem: Metabolic: Goal: Ability to maintain appropriate glucose levels will improve Outcome: Completed/Met   Problem: Metabolic: Goal: Ability to maintain appropriate glucose levels will improve Outcome: Completed/Met   Problem: Nutritional: Goal: Maintenance of adequate nutrition will improve Outcome: Completed/Met Goal: Progress toward achieving an optimal weight will improve Outcome: Completed/Met

## 2022-07-24 NOTE — Discharge Summary (Signed)
Physician Discharge Summary   Patient: [name] MRN: [id number] DOB: Jan 29, 1971  Admit date:     07/21/2022  Discharge date: 07/24/22  Discharge Physician: Cordelia Poche, MD   PCP: [name], [place]   Recommendations at discharge:  Follow-up with gastroenterology  Discharge Diagnoses: Principal Problem:   Intractable nausea and vomiting Active Problems:   Diabetic gastroparesis (HCC)   Type 1 diabetes mellitus (HCC)   Essential hypertension   Chronic diastolic CHF (congestive heart failure) (HCC)   Anemia of chronic disease   Epigastric pain   Elevated troponin   CKD (chronic kidney disease) stage 4, GFR 15-29 ml/min (HCC)  Resolved Problems:   * No resolved hospital problems. *  Hospital Course: [name] is a 51 y.o. female with a history of diabetes mellitus type 1, diabetic gastroparesis, chronic diastolic heart failure, cardiac arrest s/p CPR and ICD placement, primary hypertension, CKD stage IV, anemia of chronic disease. Patient presented secondary secondary to nausea/vomiting consistent with gastroparesis flare. She had associated abdominal and chest pain, likely related to recurrent emesis, but also had associated elevated troponin; cardiology consulted. Nausea/vomiting improved with IV Reglan.  Assessment and Plan:  Gastroparesis flare Patient with associated acute nausea/vomiting. Patient started on Reglan IV with improvement of symptoms. Tolerating diet. Reglan transitioned to oral route with continued control of symptoms.   Epigastric pain Secondary to recurrent emesis. Improved.   Demand ischemia Likely etiology for elevated troponin. Cardiology consulted and recommend no continued inpatient workup.   Diabetes mellitus type 1 Patient manages with an insulin pump as an outpatient. Resume on discharge.   Chronic diastolic heart failure Stable. Continue metoprolol.   Primary hypertension Continue hydralazine and metoprolol.   CKD stage  IV Stable. Patient plans to follow-up with nephrology as an outpatient.   Anemia of chronic disease Baseline hemoglobin of around 9. Hemoglobin of 11.3 on admission, likely secondary to hemoconcentration. Hemoglobin down to 8.6. No history of bleeding.   Consultants: Cardiology Procedures performed: None  Disposition: Home Diet recommendation: Carb modified diet   DISCHARGE MEDICATION: Allergies as of 07/24/2022       Reactions   Atorvastatin Other (See Comments)   Dizziness    Dulaglutide Nausea And Vomiting, Other (See Comments)   Caused pancreatitis    Zofran [ondansetron] Nausea And Vomiting, Other (See Comments)   Causes nausea vomiting to worsen / doesn't really work for patient.   Amoxicillin Itching, Rash   Lantus [insulin Glargine] Itching, Rash   Miconazole Nitrate Nausea And Vomiting, Rash        Medication List     TAKE these medications    acetaminophen 500 MG tablet Commonly known as: TYLENOL Take 1,000 mg by mouth 3 (three) times daily as needed for moderate pain.   albuterol 108 (90 Base) MCG/ACT inhaler Commonly known as: ProAir HFA Inhale 2 puffs into the lungs every 6 (six) hours as needed for wheezing or shortness of breath.   Ensure Max Protein Liqd Take 330 mLs (11 oz total) by mouth daily.   FreeStyle Libre 2 Sensor Misc Inject 1 Device into the skin every 14 (fourteen) days.   hydrALAZINE 50 MG tablet Commonly known as: APRESOLINE Take 1 tablet (50 mg total) by mouth 3 (three) times daily.   hydrOXYzine 50 MG tablet Commonly known as: ATARAX Take 50 mg by mouth in the morning, at noon, and at bedtime.   insulin aspart 100 UNIT/ML injection Commonly known as: novoLOG Inject into the skin See admin instructions. Patient uses  this with the Omnipod.  Units vary according to blood sugar.   LORazepam 0.5 MG tablet Commonly known as: ATIVAN Take 1 tablet (0.5 mg total) by mouth every 8 (eight) hours as needed for anxiety (Refractory  nausea and vomiting).   LUBRICATING EYE DROPS OP Place 1-2 drops into both eyes daily as needed (dry eyes).   metoCLOPramide 10 MG tablet Commonly known as: Reglan Take 1 tablet (10 mg total) by mouth 3 (three) times daily before meals.   metoprolol succinate 25 MG 24 hr tablet Commonly known as: Toprol XL Take 1 tablet (25 mg total) by mouth daily.   multivitamin with minerals Tabs tablet Take 1 tablet by mouth daily.   pantoprazole 40 MG tablet Commonly known as: Protonix Take 1 tablet (40 mg total) by mouth 2 (two) times daily before a meal.   polyethylene glycol 17 g packet Commonly known as: MIRALAX / GLYCOLAX Take 17 g by mouth daily as needed for moderate constipation.   prochlorperazine 10 MG tablet Commonly known as: COMPAZINE Take 1 tablet (10 mg total) by mouth every 6 (six) hours as needed for nausea or vomiting.   promethazine 25 MG suppository Commonly known as: PHENERGAN Unwrap and place 1 suppository (25 mg total) rectally every 6 (six) hours as needed for nausea or vomiting.   rosuvastatin 20 MG tablet Commonly known as: CRESTOR Take 1 tablet (20 mg total) by mouth daily.   scopolamine 1 MG/3DAYS Commonly known as: TRANSDERM-SCOP Place 1 patch (1.5 mg total) onto the skin every 3 (three) days.   tobramycin 0.3 % ophthalmic solution Commonly known as: TOBREX Place 1-2 drops into both eyes See admin instructions. Instill 1-2 drops into both eyes 3 times daily the day before, day of, and the day after every 6 weeks eye injections per patient   torsemide 20 MG tablet Commonly known as: DEMADEX Take 20 mg by mouth daily as needed (swelling).        Follow-up Information     [name], [place]. Schedule an appointment as soon as possible for a visit in 1 week(s).   Specialty: Internal Medicine Why: For hospital follow-up Contact information: [address] [place] [address] [phone]                Discharge  Exam: BP (!) 165/102 (BP Location: Left Arm)   Pulse 90   Temp 98.3 F (36.8 C) (Oral)   Resp 18   Ht 6' (1.829 m)   Wt 82.1 kg   LMP 11/17/2021 (Approximate)   SpO2 98%   BMI 24.55 kg/m   General exam: Appears calm and comfortable Respiratory system: Respiratory effort normal. Central nervous system: Alert and oriented. No focal neurological deficits. Psychiatry: Judgement and insight appear normal. Mood & affect appropriate.   Condition at discharge: stable  The results of significant diagnostics from this hospitalization (including imaging, microbiology, ancillary and laboratory) are listed below for reference.   Imaging Studies: DG Chest Port 1 View  Result Date: 07/22/2022 CLINICAL DATA:  Elevated troponin EXAM: PORTABLE CHEST 1 VIEW COMPARISON:  07/12/2022 FINDINGS: Left-sided pacing device as before. Cardiomegaly with slight central congestion but no overt edema. No pleural effusion, pneumothorax or focal airspace disease. IMPRESSION: Cardiomegaly with slight central congestion. Electronically Signed   By: Donavan Foil M.D.   On: 07/22/2022 21:26   DG Abdomen 1 View  Result Date: 07/21/2022 CLINICAL DATA:  Nausea and vomiting. EXAM: ABDOMEN - 1 VIEW COMPARISON:  Abdominal radiograph  05/21/2022 FINDINGS: Relative paucity of bowel gas in the upper abdomen. Scattered air in the colon in the right lower abdomen. Nonobstructive pattern. No free intraperitoneal air given the limits of the supine images. No radio-opaque calculi or other significant radiographic abnormality are seen.Partially visualized AICD lead overlying the right ventricle. IMPRESSION: Nonobstructive bowel gas pattern. Electronically Signed   By: Ileana Roup M.D.   On: 07/21/2022 22:01   NM GASTRIC EMPTYING  Result Date: 07/18/2022 CLINICAL DATA:  Concern for gastroparesis. EXAM: NUCLEAR MEDICINE GASTRIC EMPTYING SCAN TECHNIQUE: After oral ingestion of radiolabeled meal, sequential abdominal images were not  obtained secondary to patient emesis. RADIOPHARMACEUTICALS:  2.1 mCi Tc-51m sulfur colloid in standardized meal COMPARISON:  None Available. FINDINGS: A single scintigraphic image was obtained post ingestion of radiolabeled meal. However no sequential abdominal images were obtained secondary to patient emesis. IMPRESSION: Incomplete examination secondary to patient emesis. Electronically Signed   By: Dahlia Bailiff M.D.   On: 07/18/2022 12:03   NM Pulmonary Perfusion  Result Date: 07/12/2022 CLINICAL DATA:  Shortness of breath for 1 day, cough, history MI, asthma, CHF, clinically suspicious for pulmonary embolism EXAM: NUCLEAR MEDICINE PERFUSION LUNG SCAN TECHNIQUE: Perfusion images were obtained in multiple projections after intravenous injection of radiopharmaceutical. Ventilation scans intentionally deferred if perfusion scan and chest x-ray adequate for interpretation during COVID 19 epidemic. RADIOPHARMACEUTICALS:  4.0 mCi Tc-51m MAA IV COMPARISON:  Chest radiograph 07/12/2022 FINDINGS: Flattening of the posterior aspects of the lungs on lateral views, likely representing BILATERAL pleural effusions. Otherwise normal perfusion lung scan. No segmental or subsegmental perfusion defects identified to suggest pulmonary embolism. IMPRESSION: Pulmonary embolism absent. BILATERAL pleural effusions. Electronically Signed   By: Lavonia Dana M.D.   On: 07/12/2022 12:32   DG Chest Port 1 View  Result Date: 07/12/2022 CLINICAL DATA:  Worsening shortness of breath EXAM: PORTABLE CHEST 1 VIEW COMPARISON:  Chest radiograph 07/12/2022 FINDINGS: Single lead AICD device overlies the left hemithorax. Monitoring leads overlie the patient. Stable cardiomegaly. Small bilateral pleural effusions and underlying consolidation. Pulmonary vascular redistribution. No pneumothorax. IMPRESSION: 1. Cardiomegaly with pulmonary vascular congestion. 2. Small bilateral pleural effusions with underlying consolidation. Electronically Signed    By: Lovey Newcomer M.D.   On: 07/12/2022 09:39   DG Chest Port 1 View  Result Date: 07/12/2022 CLINICAL DATA:  Cough and chest pain. EXAM: PORTABLE CHEST 1 VIEW COMPARISON:  06/19/2022 FINDINGS: 0624 hours. The cardio pericardial silhouette is enlarged. There is pulmonary vascular congestion without overt pulmonary edema. Bibasilar atelectasis or infiltrate noted with small bilateral pleural effusions. Left-sided AICD again noted. Telemetry leads overlie the chest. IMPRESSION: Enlargement of the cardiopericardial silhouette with bibasilar atelectasis or infiltrate and small bilateral pleural effusions. Electronically Signed   By: Misty Stanley M.D.   On: 07/12/2022 07:00    Microbiology: Results for orders placed or performed during the hospital encounter of 07/12/22  Resp Panel by RT-PCR (Flu A&B, Covid) Anterior Nasal Swab     Status: None   Collection Time: 07/12/22  5:42 AM   Specimen: Anterior Nasal Swab  Result Value Ref Range Status   SARS Coronavirus 2 by RT PCR NEGATIVE NEGATIVE Final    Comment: (NOTE) SARS-CoV-2 target nucleic acids are NOT DETECTED.  The SARS-CoV-2 RNA is generally detectable in upper respiratory specimens during the acute phase of infection. The lowest concentration of SARS-CoV-2 viral copies this assay can detect is 138 copies/mL. A negative result does not preclude SARS-Cov-2 infection and should not be used as the sole basis  for treatment or other patient management decisions. A negative result may occur with  improper specimen collection/handling, submission of specimen other than nasopharyngeal swab, presence of viral mutation(s) within the areas targeted by this assay, and inadequate number of viral copies(<138 copies/mL). A negative result must be combined with clinical observations, patient history, and epidemiological information. The expected result is Negative.  Fact Sheet for Patients:  EntrepreneurPulse.com.au  Fact Sheet for  Healthcare Providers:  IncredibleEmployment.be  This test is no t yet approved or cleared by the [place] FDA and  has been authorized for detection and/or diagnosis of SARS-CoV-2 by FDA under an Emergency Use Authorization (EUA). This EUA will remain  in effect (meaning this test can be used) for the duration of the COVID-19 declaration under Section 564(b)(1) of the Act, 21 U.S.C.section 360bbb-3(b)(1), unless the authorization is terminated  or revoked sooner.       Influenza A by PCR NEGATIVE NEGATIVE Final   Influenza B by PCR NEGATIVE NEGATIVE Final    Comment: (NOTE) The Xpert Xpress SARS-CoV-2/FLU/RSV plus assay is intended as an aid in the diagnosis of influenza from Nasopharyngeal swab specimens and should not be used as a sole basis for treatment. Nasal washings and aspirates are unacceptable for Xpert Xpress SARS-CoV-2/FLU/RSV testing.  Fact Sheet for Patients: EntrepreneurPulse.com.au  Fact Sheet for Healthcare Providers: IncredibleEmployment.be  This test is not yet approved or cleared by the [place] FDA and has been authorized for detection and/or diagnosis of SARS-CoV-2 by FDA under an Emergency Use Authorization (EUA). This EUA will remain in effect (meaning this test can be used) for the duration of the COVID-19 declaration under Section 564(b)(1) of the Act, 21 U.S.C. section 360bbb-3(b)(1), unless the authorization is terminated or revoked.  Performed at [facility], [address] [place], [place] [address]     Labs: CBC: Recent Labs  Lab 07/21/22 2052 07/23/22 0340  WBC 6.9 5.1  NEUTROABS 5.2 2.8  HGB 11.3* 8.6*  HCT 34.6* 26.6*  MCV 86.1 89.0  PLT 300 212   Basic Metabolic Panel: Recent Labs  Lab 07/21/22 2052 07/22/22 0320 07/22/22 2121 07/23/22 0340  NA 143  --  142 140  K 4.2  --  4.4 4.5  CL 105  --  108 107  CO2 26  --  27 26  GLUCOSE 193*   --  174* 177*  BUN 29*  --  25* 25*  CREATININE 3.04*  --  3.22* 3.37*  CALCIUM 9.1  --  8.3* 8.2*  MG  --  1.8  --  1.9  PHOS  --  4.8*  --   --    Liver Function Tests: Recent Labs  Lab 07/21/22 2052 07/23/22 0340  AST 12* 11*  ALT 9 10  ALKPHOS 77 60  BILITOT 0.8 1.0  PROT 6.9 4.7*  ALBUMIN 3.6 2.1*   CBG: Recent Labs  Lab 07/23/22 1624 07/23/22 2000 07/23/22 2342 07/24/22 0454 07/24/22 0757  GLUCAP 259* 107* 243* 274* 222*    Discharge time spent:  35 minutes.  Signed: Cordelia Poche, MD [facility] 07/24/2022

## 2022-07-24 NOTE — Progress Notes (Signed)
DISCHARGE NOTE HOME [name] to be discharged Home per MD order. Discussed prescriptions and follow up appointments with the patient. Prescriptions given to patient; medication list explained in detail. Patient verbalized understanding.  Skin clean, dry and intact without evidence of skin break down, no evidence of skin tears noted. IV catheter discontinued intact. Site without signs and symptoms of complications. Dressing and pressure applied. Pt denies pain at the site currently. No complaints noted.  Patient free of lines, drains, and wounds.   An After Visit Summary (AVS) was printed and given to the patient. Patient escorted via wheelchair, and discharged home via private auto.  Keenan Bachelor, RN

## 2022-07-24 NOTE — Progress Notes (Signed)
Dr. Claria Dice [facility] notified that patient is having chest pressure that radiates down rt arm. 650 Tylenol po given bp 168/98 hr 95 96%RA.

## 2022-07-24 NOTE — TOC Transition Note (Signed)
Transition of Care [facility]) - CM/SW Discharge Note   Patient Details  Name: Bridgid Printz MRN: [id number] Date of Birth: 05-10-1971  [facility]) CM/SW Contact:  Tom-Johnson, Renea Ee, RN Phone Number: 07/24/2022, 10:28 AM   Clinical Narrative:     Patient is scheduled for discharge home today. No TOC needs or recommendations noted. Family to transport at discharge. No further TOC needs noted.      Final next level of care: Home/Self Care Barriers to Discharge: Barriers Resolved   Patient Goals and CMS Choice Patient states their goals for this hospitalization and ongoing recovery are:: To return home CMS Medicare.gov Compare Post Acute Care list provided to:: Patient Choice offered to / list presented to : NA    Discharge Placement                Patient to be transferred to facility by: Family      Discharge Plan and Services                DME Arranged: N/A DME Agency: NA       HH Arranged: NA HH Agency: NA        Social Determinants of Health (SDOH) Interventions Transportation Interventions: Intervention Not Indicated, Inpatient TOC, Patient Resources (Friends/Family)   Readmission Risk Interventions    07/17/2022    9:51 AM 06/21/2022    9:35 AM 04/26/2022   10:53 AM  Readmission Risk Prevention Plan  Transportation Screening Complete Complete Complete  PCP or Specialist Appt within 5-7 Days   Complete  Home Care Screening   Complete  Medication Review (RN CM)   Complete  Medication Review Press photographer) Complete Complete   PCP or Specialist appointment within 3-5 days of discharge Complete Complete   HRI or Home Care Consult Complete Complete   SW Recovery Care/Counseling Consult  Complete   Palliative Care Screening Not Applicable Not [place] Not Applicable Complete

## 2022-07-24 NOTE — Discharge Instructions (Addendum)
[name],  You were in the hospital because of nausea/vomiting related to your gastroparesis. You have improved with IV medication and can resume your home medications. Please follow-up with your GI physicians as able.

## 2022-08-05 NOTE — Progress Notes (Incomplete)
HEART & VASCULAR TRANSITION OF CARE CONSULT NOTE     Referring Physician: Primary Care: Primary Cardiologist: Dr. Tamala Julian  HPI: Referred to clinic by *** for heart failure consultation. 52 y.o. female with history of DMI c/b gastroparesis, esophagitis, HFpEF, hx VT/VF arrest s/p Medtronic single chamber ICD 09/23, CKD IV, hx GI bleeding, anemia of chronic disease. Hx recurrent admissions for nausea and vomiting.  Echo 05/21: EF 50-55%  Coronary CTA 06/21: No CAD, calcium score 0, severe BAE  Echo 01/22: EF 40-45%  Echo 09/23: EF 50-55%, mild LVH, grade II DD, RV okay, RVSP 38 mmHg, severe LAE  Admitted 12/08-12/13/23 with acute on chronic HFpEF, chest pain (frequent since CPR several months prior), recurrent N/V and hypertensive urgency.  She was diuresed with IV lasix. Not discharged on diuretic d/t variable PO intake with frequent N/V.   GDMT limited by CKD IV.  Presented to [facility] 07/22/22 with N, V and dehydration. Had vomited during gastric emptying study several days prior. Has been following with [facility]. ICD interrogated - normal function, no device discharges. She was tachycardic and HS troponin slightly elevated 189>198>226>278. Cardiology consulted. No ischemic workup recommended d/t calcium score of 0 in 2021 and Scr of 3. Dose of beta blocker increased.     Cardiac Testing    Review of Systems: [y] = yes, [ ]  = no   General: Weight gain [ ] ; Weight loss [ ] ; Anorexia [ ] ; Fatigue [ ] ; Fever [ ] ; Chills [ ] ; Weakness [ ]   Cardiac: Chest pain/pressure [ ] ; Resting SOB [ ] ; Exertional SOB [ ] ; Orthopnea [ ] ; Pedal Edema [ ] ; Palpitations [ ] ; Syncope [ ] ; Presyncope [ ] ; Paroxysmal nocturnal dyspnea[ ]   Pulmonary: Cough [ ] ; Wheezing[ ] ; Hemoptysis[ ] ; Sputum [ ] ; Snoring [ ]   GI: Vomiting[ ] ; Dysphagia[ ] ; Melena[ ] ; Hematochezia [ ] ; Heartburn[ ] ; Abdominal pain [ ] ; Constipation [ ] ; Diarrhea [ ] ; BRBPR [ ]   GU: Hematuria[ ] ; Dysuria [ ] ; Nocturia[ ]    Vascular: Pain in legs with walking [ ] ; Pain in feet with lying flat [ ] ; Non-healing sores [ ] ; Stroke [ ] ; TIA [ ] ; Slurred speech [ ] ;  Neuro: Headaches[ ] ; Vertigo[ ] ; Seizures[ ] ; Paresthesias[ ] ;Blurred vision [ ] ; Diplopia [ ] ; Vision changes [ ]   Ortho/Skin: Arthritis [ ] ; Joint pain [ ] ; Muscle pain [ ] ; Joint swelling [ ] ; Back Pain [ ] ; Rash [ ]   Psych: Depression[ ] ; Anxiety[ ]   Heme: Bleeding problems [ ] ; Clotting disorders [ ] ; Anemia [ ]   Endocrine: Diabetes [ ] ; Thyroid dysfunction[ ]    Past Medical History:  Diagnosis Date   Anemia    Anxiety    Arthritis    Asthma    CHF (congestive heart failure) (HCC)    Depression    Diabetic ulcer of left foot (HCC) 05/12/2013   Gastroparesis    Generalized abdominal pain    History of chicken pox    Loss of weight 12/02/2019   Migraines    Mood disorder (HCC)    anxiety   Prurigo nodularis    with diabetic dermopathy   Type 1 diabetes, uncontrolled, with neuropathy    Phadke   Ulcers of both lower legs ([place]) 02/20/2014    Current Outpatient Medications  Medication Sig Dispense Refill   acetaminophen (TYLENOL) 500 MG tablet Take 1,000 mg by mouth 3 (three) times daily as needed for moderate pain.     albuterol (PROAIR  HFA) 108 (90 Base) MCG/ACT inhaler Inhale 2 puffs into the lungs every 6 (six) hours as needed for wheezing or shortness of breath. 6.7 g 1   Carboxymethylcellul-Glycerin (LUBRICATING EYE DROPS OP) Place 1-2 drops into both eyes daily as needed (dry eyes).     Continuous Blood Gluc Sensor (FREESTYLE LIBRE 2 SENSOR) MISC Inject 1 Device into the skin every 14 (fourteen) days.     Ensure Max Protein (ENSURE MAX PROTEIN) LIQD Take 330 mLs (11 oz total) by mouth daily.     hydrALAZINE (APRESOLINE) 50 MG tablet Take 1 tablet (50 mg total) by mouth 3 (three) times daily. 90 tablet 1   hydrOXYzine (ATARAX) 50 MG tablet Take 50 mg by mouth in the morning, at noon, and at bedtime. (Patient not taking: Reported on  07/22/2022)     insulin aspart (NOVOLOG) 100 UNIT/ML injection Inject into the skin See admin instructions. Patient uses this with the [place].  Units vary according to blood sugar.     LORazepam (ATIVAN) 0.5 MG tablet Take 1 tablet (0.5 mg total) by mouth every 8 (eight) hours as needed for anxiety (Refractory nausea and vomiting). 30 tablet 0   metoCLOPramide (REGLAN) 10 MG tablet Take 1 tablet (10 mg total) by mouth 3 (three) times daily before meals. 90 tablet 0   metoprolol succinate (TOPROL XL) 25 MG 24 hr tablet Take 1 tablet (25 mg total) by mouth daily. 90 tablet 3   Multiple Vitamin (MULTIVITAMIN WITH MINERALS) TABS tablet Take 1 tablet by mouth daily.     pantoprazole (PROTONIX) 40 MG tablet Take 1 tablet (40 mg total) by mouth 2 (two) times daily before a meal. 60 tablet 1   polyethylene glycol (MIRALAX / GLYCOLAX) 17 g packet Take 17 g by mouth daily as needed for moderate constipation.     prochlorperazine (COMPAZINE) 10 MG tablet Take 1 tablet (10 mg total) by mouth every 6 (six) hours as needed for nausea or vomiting. 30 tablet 1   promethazine (PHENERGAN) 25 MG suppository Unwrap and place 1 suppository (25 mg total) rectally every 6 (six) hours as needed for nausea or vomiting. 30 each 2   rosuvastatin (CRESTOR) 20 MG tablet Take 1 tablet (20 mg total) by mouth daily. 30 tablet 3   scopolamine (TRANSDERM-SCOP) 1 MG/3DAYS Place 1 patch (1.5 mg total) onto the skin every 3 (three) days. 24 patch 4   tobramycin (TOBREX) 0.3 % ophthalmic solution Place 1-2 drops into both eyes See admin instructions. Instill 1-2 drops into both eyes 3 times daily the day before, day of, and the day after every 6 weeks eye injections per patient     torsemide (DEMADEX) 20 MG tablet Take 20 mg by mouth daily as needed (swelling).     No current facility-administered medications for this visit.    Allergies  Allergen Reactions   Atorvastatin Other (See Comments)    Dizziness    Dulaglutide Nausea And  Vomiting and Other (See Comments)    Caused pancreatitis    Zofran [Ondansetron] Nausea And Vomiting and Other (See Comments)    Causes nausea vomiting to worsen / doesn't really work for patient.   Amoxicillin Itching and Rash   Lantus [Insulin Glargine] Itching and Rash   Miconazole Nitrate Nausea And Vomiting and Rash      Social History   Socioeconomic History   Marital status: Divorced    Spouse name: Not on file   Number of children: 2   Years of education:  Not on file   Highest education level: Not on file  Occupational History   Occupation: Freight forwarder  Tobacco Use   Smoking status: Never   Smokeless tobacco: Never  Vaping Use   Vaping Use: Never used  Substance and Sexual Activity   Alcohol use: Not Currently   Drug use: No   Sexual activity: Yes    Partners: Male    Birth control/protection: None  Other Topics Concern   Not on file  Social History Narrative   Caffeine use: daily   Occupation: cleans houses   Regular exercise: yes, active 5x/wk   Diet: good water, fruits/vegetables daily   Social Determinants of Health   Financial Resource Strain: Low Risk  (07/15/2022)   Overall Financial Resource Strain (CARDIA)    Difficulty of Paying Living Expenses: Not very hard  Food Insecurity: No Food Insecurity (07/22/2022)   Hunger Vital Sign    Worried About Running Out of Food in the Last Year: Never true    [place] in the Last Year: Never true  Recent Concern: [place] Present (04/23/2022)   Hunger Vital Sign    Worried About Running Out of Food in the Last Year: Sometimes true    Ran Out of Food in the Last Year: Sometimes true  Transportation Needs: No Transportation Needs (07/22/2022)   PRAPARE - Hydrologist (Medical): No    Lack of Transportation (Non-Medical): No  Physical Activity: Not on file  Stress: Not on file  Social Connections: Not on file  Intimate Partner Violence: Not At Risk  (07/22/2022)   Humiliation, Afraid, Rape, and Kick questionnaire    Fear of Current or Ex-Partner: No    Emotionally Abused: No    Physically Abused: No    Sexually Abused: No      Family History  Problem Relation Age of Onset   Diabetes Mother        type 2   Hypertension Mother    Thyroid disease Mother    Bipolar disorder Mother    Heart disease Mother    Calcium disorder Mother    Cancer Maternal Grandmother        Breast, stomach   Cancer Paternal Grandmother        stomach, lung (smoker)   Diabetes Paternal Grandmother    Diabetes Paternal Grandfather    Heart failure Sister    Diabetes Sister    Stroke Maternal Aunt    Cancer Maternal Uncle        prostate   CAD Maternal Aunt        stents   Cancer Maternal Aunt 64       ovarian    There were no vitals filed for this visit.  PHYSICAL EXAM: General:  Well appearing. No respiratory difficulty HEENT: normal Neck: supple. no JVD. Carotids 2+ bilat; no bruits. No lymphadenopathy or thryomegaly appreciated. Cor: PMI nondisplaced. Regular rate & rhythm. No rubs, gallops or murmurs. Lungs: clear Abdomen: soft, nontender, nondistended. No hepatosplenomegaly. No bruits or masses. Good bowel sounds. Extremities: no cyanosis, clubbing, rash, edema Neuro: alert & oriented x 3, cranial nerves grossly intact. moves all 4 extremities w/o difficulty. Affect pleasant.  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

## 2022-08-06 ENCOUNTER — Ambulatory Visit (HOSPITAL_COMMUNITY)
Admit: 2022-08-06 | Discharge: 2022-08-06 | Disposition: A | Discharge status: Home / Self Care | Payer: BC Managed Care – PPO | Source: Ambulatory Visit | Attending: Physician Assistant | Admitting: Physician Assistant

## 2022-08-06 ENCOUNTER — Encounter (HOSPITAL_COMMUNITY): Payer: Self-pay

## 2022-08-06 ENCOUNTER — Ambulatory Visit: Payer: BC Managed Care – PPO

## 2022-08-06 VITALS — BP 130/90 | HR 96 | Wt 193.0 lb

## 2022-08-06 DIAGNOSIS — E1022 Type 1 diabetes mellitus with diabetic chronic kidney disease: Secondary | ICD-10-CM | POA: Diagnosis not present

## 2022-08-06 DIAGNOSIS — E1043 Type 1 diabetes mellitus with diabetic autonomic (poly)neuropathy: Secondary | ICD-10-CM | POA: Insufficient documentation

## 2022-08-06 DIAGNOSIS — D631 Anemia in chronic kidney disease: Secondary | ICD-10-CM | POA: Diagnosis not present

## 2022-08-06 DIAGNOSIS — I5042 Chronic combined systolic (congestive) and diastolic (congestive) heart failure: Secondary | ICD-10-CM | POA: Diagnosis not present

## 2022-08-06 DIAGNOSIS — Z794 Long term (current) use of insulin: Secondary | ICD-10-CM | POA: Insufficient documentation

## 2022-08-06 DIAGNOSIS — K209 Esophagitis, unspecified without bleeding: Secondary | ICD-10-CM | POA: Insufficient documentation

## 2022-08-06 DIAGNOSIS — R140 Abdominal distension (gaseous): Secondary | ICD-10-CM | POA: Diagnosis not present

## 2022-08-06 DIAGNOSIS — I130 Hypertensive heart and chronic kidney disease with heart failure and stage 1 through stage 4 chronic kidney disease, or unspecified chronic kidney disease: Secondary | ICD-10-CM | POA: Insufficient documentation

## 2022-08-06 DIAGNOSIS — Z79899 Other long term (current) drug therapy: Secondary | ICD-10-CM | POA: Diagnosis not present

## 2022-08-06 DIAGNOSIS — I10 Essential (primary) hypertension: Secondary | ICD-10-CM | POA: Diagnosis not present

## 2022-08-06 DIAGNOSIS — I472 Ventricular tachycardia, unspecified: Secondary | ICD-10-CM

## 2022-08-06 DIAGNOSIS — I5033 Acute on chronic diastolic (congestive) heart failure: Secondary | ICD-10-CM

## 2022-08-06 DIAGNOSIS — Z8674 Personal history of sudden cardiac arrest: Secondary | ICD-10-CM | POA: Diagnosis not present

## 2022-08-06 DIAGNOSIS — K3184 Gastroparesis: Secondary | ICD-10-CM | POA: Insufficient documentation

## 2022-08-06 DIAGNOSIS — N184 Chronic kidney disease, stage 4 (severe): Secondary | ICD-10-CM

## 2022-08-06 DIAGNOSIS — E1143 Type 2 diabetes mellitus with diabetic autonomic (poly)neuropathy: Secondary | ICD-10-CM | POA: Diagnosis not present

## 2022-08-06 LAB — COMPREHENSIVE METABOLIC PANEL
ALT: 49 U/L — ABNORMAL HIGH (ref 0–44)
AST: 55 U/L — ABNORMAL HIGH (ref 15–41)
Albumin: 2.5 g/dL — ABNORMAL LOW (ref 3.5–5.0)
Alkaline Phosphatase: 118 U/L (ref 38–126)
Anion gap: 8 (ref 5–15)
BUN: 40 mg/dL — ABNORMAL HIGH (ref 6–20)
CO2: 22 mmol/L (ref 22–32)
Calcium: 8.3 mg/dL — ABNORMAL LOW (ref 8.9–10.3)
Chloride: 108 mmol/L (ref 98–111)
Creatinine, Ser: 3.26 mg/dL — ABNORMAL HIGH (ref 0.44–1.00)
GFR, Estimated: 16 mL/min — ABNORMAL LOW (ref >60)
Glucose, Bld: 116 mg/dL — ABNORMAL HIGH (ref 70–99)
Potassium: 4.3 mmol/L (ref 3.5–5.1)
Sodium: 138 mmol/L (ref 135–145)
Total Bilirubin: 0.6 mg/dL (ref 0.3–1.2)
Total Protein: 5.5 g/dL — ABNORMAL LOW (ref 6.5–8.1)

## 2022-08-06 LAB — BRAIN NATRIURETIC PEPTIDE: B Natriuretic Peptide: 819.2 pg/mL — ABNORMAL HIGH (ref 0.0–100.0)

## 2022-08-06 MED ORDER — TORSEMIDE 40 MG PO TABS: 40.0000 mg | ORAL_TABLET | Freq: Every day | ORAL | 3 refills | Status: DC

## 2022-08-06 MED ORDER — TORSEMIDE 20 MG PO TABS: 40.0000 mg | ORAL_TABLET | Freq: Every day | ORAL | 3 refills | Status: DC

## 2022-08-06 NOTE — Progress Notes (Signed)
ReDS Vest / Clip - 08/06/22 1200       ReDS Vest / Clip   Station Marker C    Ruler Value 29    ReDS Value Range High volume overload    ReDS Actual Value 47

## 2022-08-06 NOTE — Patient Instructions (Addendum)
Increase Torsemide to 40 mg daily (two tablets daily) - new Rx sent to [facility] as requested.  Return to see General Cardiology in 4 weeks - see scheduled appointment. If you need to reschedule or cancel, please call [phone]. Return to see Electrophysiology for continued follow up. Please call [phone] to make appointment.  Provided patient with scales and Rx for home blood pressure cuff today. Return to [facility] in one week.

## 2022-08-06 NOTE — Telephone Encounter (Signed)
Called to confirm Heart & Vascular Transitions of Care appointment at 12 noon on 08/06/22. Patient reminded to bring all medications and pill box organizer with them. Confirmed patient has transportation. Gave directions, instructed to utilize [place] parking.  Confirmed appointment prior to ending call.    Earnestine Leys, BSN, Clinical cytogeneticist Only

## 2022-08-06 NOTE — Progress Notes (Signed)
Heart and Vascular Care Navigation  08/06/2022  [name] 19-Nov-1970 [id number]  Reason for Referral: Patient seen in HF [facility] Patient is participating in a Managed Medicaid Plan: no  Engaged with patient face to face for initial visit for Heart and Vascular Care Coordination.                                                                                                   Assessment:  Patient is a 52 yo female who lives at home with her two sons ages 48 yo and 48 yo. 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 She reports she has multiple medical challenges including diabetes. She noted that both her sons have diabetes as well and are insulin dependent. Patient states she has been out of work since September 1 and has a pending STD application with her employer. She also mentioned that she was denied for social security but does not have the letter with her. Patient states she only applied for Social security disability in September and received the denial letter  in early December.                                 HRT/VAS Care Coordination     Patients Home Cardiology Office Heart Failure Clinic  HF [facility]   Outpatient Care Team Social Worker   Social Worker Name: Raquel Sarna, [place] [phone]   Living arrangements for the past 2 months Single Family Home   Lives with: Adult Children  36 yo and 61yo sons   Patient Current Librarian, academic   Patient Has Concern With Paying Medical Bills No   Does Patient Have Prescription Coverage? Yes   Home Assistive Devices/Equipment None   DME Agency NA   HH Agency NA       Social History:                                                                             SDOH Screenings   Food Insecurity: No Food Insecurity (07/22/2022)  Recent Concern: Food Insecurity - Food Insecurity Present (04/23/2022)  Housing: Low Risk  (07/22/2022)  Transportation Needs: No  Transportation Needs (07/22/2022)  Utilities: Not At Risk (07/22/2022)  Alcohol Screen: Low Risk  (07/15/2022)  Depression (PHQ2-9): Low Risk  (06/12/2020)  Financial Resource Strain: Low Risk  (07/15/2022)  Tobacco Use: Low Risk  (07/22/2022)    SDOH Interventions: Financial Resources:    Fish farm manager for [employer] Insecurity:   denies  Housing Insecurity:   denies  Transportation:    denies   Follow-up plan:  CSW explained the [employer] application process and suggested that  patient re read the denial letter to confirm that it was SSDI vs SSI. It is possible that she was denied for SSI which she does not qualify for as she has a significant work history making her ineligible for SSI and that she may still have a pending SSDI application with Fish farm manager. Patient verbalizes understanding and will follow up with CSW if needed further assistance with appeal for disability. Raquel Sarna, [place], [place]

## 2022-08-07 LAB — CUP PACEART REMOTE DEVICE CHECK
Battery Remaining Longevity: 132 mo
Battery Voltage: 3.07 V
Brady Statistic RV Percent Paced: 0 %
Date Time Interrogation Session: 20240102191829
HighPow Impedance: 47 Ohm
Implantable Lead Connection Status: 753985
Implantable Lead Implant Date: 20230905
Implantable Lead Location: 753860
Implantable Pulse Generator Implant Date: 20230905
Lead Channel Impedance Value: 266 Ohm
Lead Channel Impedance Value: 323 Ohm
Lead Channel Pacing Threshold Amplitude: 0.625 V
Lead Channel Pacing Threshold Pulse Width: 0.4 ms
Lead Channel Sensing Intrinsic Amplitude: 3.375 mV
Lead Channel Sensing Intrinsic Amplitude: 3.375 mV
Lead Channel Setting Pacing Amplitude: 2 V
Lead Channel Setting Pacing Pulse Width: 0.4 ms
Lead Channel Setting Sensing Sensitivity: 0.3 mV
Zone Setting Status: 755011
Zone Setting Status: 755011

## 2022-08-12 NOTE — Progress Notes (Signed)
HEART IMPACT TRANSITIONS OF CARE    PCP: Megan Capers PA Primary Cardiologist: Former patient of Dr [name]  EP: Dr [name] Neph[facility]  HPI: [name] is a 52 y.o. female with history of DMI c/b gastroparesis, esophagitis, HFpEF, hx VT/VF arrest s/p Medtronic single chamber ICD 09/23, CKD IV, hx GI bleeding, anemia of chronic disease. Hx recurrent admissions for nausea and vomiting.   Echo 05/21: EF 50-55%   Coronary CTA 06/21: No CAD, calcium score 0, severe BAE   Echo 01/22: EF 40-45%   Echo 09/23: EF 50-55%, mild LVH, grade II DD, RV okay, RVSP 38 mmHg, severe LAE   Admitted 12/08-12/13/23 with acute on chronic HFpEF, chest pain (frequent since CPR several months prior), recurrent N/V and hypertensive urgency.  She was diuresed with IV lasix. Not discharged on diuretic d/t variable PO intake with frequent N/V.   GDMT limited by CKD IV.   Presented to [facility] ED 07/22/22 with N, V and dehydration. Had vomited during gastric emptying study several days prior. Has been following with [facility]. ICD interrogated - normal function, no device discharges. She was tachycardic and HS troponin slightly elevated 189>198>226>278. Cardiology consulted. No ischemic workup recommended d/t calcium score of 0 in 2021 and Scr of 3. Dose of beta blocker increased.   She was seen in the HF [facility] clinic as new patient last week. Volume overloaded. Torsemide was increased to 40 mg daily.   Today she returns for HF follow up.Overall feeling fair. Having intermittent nausea due to gastroparesis. SOB with exertion.  Gets tired ambulating. Denies PND/Orthopnea. Appetite ok. Eating soup with crackers. No fever or chills. Weight at home 189-192 pounds. Taking all medications.  Optivol - No VT/AF. Activity ~1 hour per day. Impedance down. Fluid index above threshold.  ROS: All systems negative except as listed in HPI, PMH and Problem List.  SH:  Social History   Socioeconomic History   Marital  status: Divorced    Spouse name: Not on file   Number of children: 2   Years of education: Not on file   Highest education level: Not on file  Occupational History   Occupation: Production designer, theatre/television/film  Tobacco Use   Smoking status: Never   Smokeless tobacco: Never  Vaping Use   Vaping Use: Never used  Substance and Sexual Activity   Alcohol use: Not Currently   Drug use: No   Sexual activity: Yes    Partners: Male    Birth control/protection: None  Other Topics Concern   Not on file  Social History Narrative   Caffeine use: daily   Occupation: cleans houses   Regular exercise: yes, active 5x/wk   Diet: good water, fruits/vegetables daily   Social Determinants of Health   Financial Resource Strain: Low Risk  (07/15/2022)   Overall Financial Resource Strain (CARDIA)    Difficulty of Paying Living Expenses: Not very hard  Food Insecurity: No Food Insecurity (07/22/2022)   Hunger Vital Sign    Worried About Running Out of Food in the Last Year: Never true    Ran Out of Food in the Last Year: Never true  Recent Concern: Food Insecurity - Food Insecurity Present (04/23/2022)   Hunger Vital Sign    Worried About Running Out of Food in the Last Year: Sometimes true    Ran Out of Food in the Last Year: Sometimes true  Transportation Needs: No Transportation Needs (07/22/2022)   PRAPARE - Administrator, Civil Service (Medical): No  Lack of Transportation (Non-Medical): No  Physical Activity: Not on file  Stress: Not on file  Social Connections: Not on file  Intimate Partner Violence: Not At Risk (07/22/2022)   Humiliation, Afraid, Rape, and Kick questionnaire    Fear of Current or Ex-Partner: No    Emotionally Abused: No    Physically Abused: No    Sexually Abused: No    FH:  Family History  Problem Relation Age of Onset   Diabetes Mother        type 2   Hypertension Mother    Thyroid disease Mother    Bipolar disorder Mother    Heart disease Mother    Calcium  disorder Mother    Cancer Maternal Grandmother        Breast, stomach   Cancer Paternal Grandmother        stomach, lung (smoker)   Diabetes Paternal Grandmother    Diabetes Paternal Grandfather    Heart failure Sister    Diabetes Sister    Stroke Maternal Aunt    Cancer Maternal Uncle        prostate   CAD Maternal Aunt        stents   Cancer Maternal Aunt 64       ovarian    Past Medical History:  Diagnosis Date   Anemia    Anxiety    Arthritis    Asthma    CHF (congestive heart failure) ([place])    Depression    Diabetic ulcer of left foot ([place]) 05/12/2013   Gastroparesis    Generalized abdominal pain    History of chicken pox    Loss of weight 12/02/2019   Migraines    Mood disorder (HCC)    anxiety   Prurigo nodularis    with diabetic dermopathy   Type 1 diabetes, uncontrolled, with neuropathy    Phadke   Ulcers of both lower legs ([place]) 02/20/2014    Current Outpatient Medications  Medication Sig Dispense Refill   acetaminophen (TYLENOL) 500 MG tablet Take 1,000 mg by mouth 3 (three) times daily as needed for moderate pain.     albuterol (PROAIR HFA) 108 (90 Base) MCG/ACT inhaler Inhale 2 puffs into the lungs every 6 (six) hours as needed for wheezing or shortness of breath. 6.7 g 1   Carboxymethylcellul-Glycerin (LUBRICATING EYE DROPS OP) Place 1-2 drops into both eyes daily as needed (dry eyes).     Continuous Blood Gluc Sensor (FREESTYLE LIBRE 2 SENSOR) MISC Inject 1 Device into the skin every 14 (fourteen) days.     Ensure Max Protein (ENSURE MAX PROTEIN) LIQD Take 330 mLs (11 oz total) by mouth daily.     hydrALAZINE (APRESOLINE) 50 MG tablet Take 1 tablet (50 mg total) by mouth 3 (three) times daily. 90 tablet 1   hydrOXYzine (ATARAX) 50 MG tablet Take 50 mg by mouth in the morning, at noon, and at bedtime.     insulin aspart (NOVOLOG) 100 UNIT/ML injection Inject into the skin See admin instructions. Patient uses this with the [place].  Units vary according  to blood sugar.     LORazepam (ATIVAN) 0.5 MG tablet Take 1 tablet (0.5 mg total) by mouth every 8 (eight) hours as needed for anxiety (Refractory nausea and vomiting). 30 tablet 0   metoCLOPramide (REGLAN) 10 MG tablet Take 1 tablet (10 mg total) by mouth 3 (three) times daily before meals. 90 tablet 0   metoprolol succinate (TOPROL XL) 25 MG 24 hr tablet Take  1 tablet (25 mg total) by mouth daily. 90 tablet 3   Multiple Vitamin (MULTIVITAMIN WITH MINERALS) TABS tablet Take 1 tablet by mouth daily.     polyethylene glycol (MIRALAX / GLYCOLAX) 17 g packet Take 17 g by mouth daily as needed for moderate constipation.     prochlorperazine (COMPAZINE) 10 MG tablet Take 1 tablet (10 mg total) by mouth every 6 (six) hours as needed for nausea or vomiting. 30 tablet 1   promethazine (PHENERGAN) 25 MG suppository Unwrap and place 1 suppository (25 mg total) rectally every 6 (six) hours as needed for nausea or vomiting. 30 each 2   rosuvastatin (CRESTOR) 20 MG tablet Take 1 tablet (20 mg total) by mouth daily. 30 tablet 3   scopolamine (TRANSDERM-SCOP) 1 MG/3DAYS Place 1 patch (1.5 mg total) onto the skin every 3 (three) days. 24 patch 4   tobramycin (TOBREX) 0.3 % ophthalmic solution Place 1-2 drops into both eyes See admin instructions. Instill 1-2 drops into both eyes 3 times daily the day before, day of, and the day after every 6 weeks eye injections per patient     torsemide 40 MG TABS Take 40 mg by mouth daily. 90 tablet 3   pantoprazole (PROTONIX) 40 MG tablet Take 1 tablet (40 mg total) by mouth 2 (two) times daily before a meal. 60 tablet 1   No current facility-administered medications for this encounter.    Vitals:   08/14/22 0934  BP: (!) 140/90  Pulse: (!) 106  SpO2: 95%  Weight: 85.7 kg (189 lb)    Wt Readings from Last 3 Encounters:  08/14/22 85.7 kg (189 lb)  08/06/22 87.5 kg (193 lb)  07/24/22 82.1 kg (181 lb)   PHYSICAL EXAM: General: Appears weak . No resp difficulty HEENT:  normal Neck: supple. JVP 9-10 . Carotids 2+ bilaterally; no bruits. No lymphadenopathy or thryomegaly appreciated. Cor: PMI normal. Regular rate & rhythm. No rubs, gallops or murmurs. Lungs: clear Abdomen: soft, nontender, nondistended. No hepatosplenomegaly. No bruits or masses. Good bowel sounds. Extremities: no cyanosis, clubbing, rash, trace lower extremity edema Neuro: alert & orientedx3, cranial nerves grossly intact. Moves all 4 extremities w/o difficulty. Affect pleasant.   ASSESSMENT & PLAN:  HFpEF -Echo 09/23: EF 50-55%, RV okay, severe LAE Reds CLip 49% NYHA III GDMT  Volume status elevated. Reds Clip 49%. Optivol suggestive of fluid accumulation. I discussed interrogation. Increase torsemide to 60 mg daily.  BB-Metoprolol xl 25 mg daily Ace/ARB/ARNI-No CKD MRA-No CKD SGLT2i-No, DM I - Check BMET today and next week.  - Discussed low salt food choices. Limiting sodium. Difficult to limit fluids with ongoing N/V issues.    Hx VT/VF arrest -OOH Witnessed arrested 09/23, s/p single chamber MDT ICD -Needs f/u EP. We will set up .     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  Follow-up with [facility] later this week.    HTN May need to increase hydralazine next visit but   Gastroparesis Had f/u with GI next month.    Follow up next week to reassess volume status.  Amel Kitch NP-C  9:42 AM

## 2022-08-14 ENCOUNTER — Ambulatory Visit (HOSPITAL_COMMUNITY)
Admission: RE | Admit: 2022-08-14 | Discharge: 2022-08-14 | Disposition: A | Discharge status: Home / Self Care | Payer: BC Managed Care – PPO | Source: Ambulatory Visit | Attending: Adult Health | Admitting: Adult Health

## 2022-08-14 VITALS — BP 140/90 | HR 106 | Wt 189.0 lb

## 2022-08-14 DIAGNOSIS — Z794 Long term (current) use of insulin: Secondary | ICD-10-CM | POA: Diagnosis not present

## 2022-08-14 DIAGNOSIS — I5032 Chronic diastolic (congestive) heart failure: Secondary | ICD-10-CM | POA: Diagnosis not present

## 2022-08-14 DIAGNOSIS — K3184 Gastroparesis: Secondary | ICD-10-CM | POA: Diagnosis not present

## 2022-08-14 DIAGNOSIS — N184 Chronic kidney disease, stage 4 (severe): Secondary | ICD-10-CM | POA: Insufficient documentation

## 2022-08-14 DIAGNOSIS — I10 Essential (primary) hypertension: Secondary | ICD-10-CM | POA: Diagnosis not present

## 2022-08-14 DIAGNOSIS — Z8674 Personal history of sudden cardiac arrest: Secondary | ICD-10-CM | POA: Diagnosis not present

## 2022-08-14 DIAGNOSIS — K209 Esophagitis, unspecified without bleeding: Secondary | ICD-10-CM | POA: Diagnosis not present

## 2022-08-14 DIAGNOSIS — I130 Hypertensive heart and chronic kidney disease with heart failure and stage 1 through stage 4 chronic kidney disease, or unspecified chronic kidney disease: Secondary | ICD-10-CM | POA: Insufficient documentation

## 2022-08-14 DIAGNOSIS — Z79899 Other long term (current) drug therapy: Secondary | ICD-10-CM | POA: Insufficient documentation

## 2022-08-14 DIAGNOSIS — E10620 Type 1 diabetes mellitus with diabetic dermatitis: Secondary | ICD-10-CM | POA: Diagnosis not present

## 2022-08-14 DIAGNOSIS — R0602 Shortness of breath: Secondary | ICD-10-CM | POA: Diagnosis not present

## 2022-08-14 DIAGNOSIS — E1043 Type 1 diabetes mellitus with diabetic autonomic (poly)neuropathy: Secondary | ICD-10-CM | POA: Diagnosis not present

## 2022-08-14 DIAGNOSIS — I5033 Acute on chronic diastolic (congestive) heart failure: Secondary | ICD-10-CM | POA: Diagnosis not present

## 2022-08-14 DIAGNOSIS — E1022 Type 1 diabetes mellitus with diabetic chronic kidney disease: Secondary | ICD-10-CM | POA: Diagnosis not present

## 2022-08-14 DIAGNOSIS — R0609 Other forms of dyspnea: Secondary | ICD-10-CM

## 2022-08-14 DIAGNOSIS — D638 Anemia in other chronic diseases classified elsewhere: Secondary | ICD-10-CM | POA: Insufficient documentation

## 2022-08-14 LAB — BASIC METABOLIC PANEL
Anion gap: 9 (ref 5–15)
BUN: 38 mg/dL — ABNORMAL HIGH (ref 6–20)
CO2: 22 mmol/L (ref 22–32)
Calcium: 8.2 mg/dL — ABNORMAL LOW (ref 8.9–10.3)
Chloride: 110 mmol/L (ref 98–111)
Creatinine, Ser: 3.61 mg/dL — ABNORMAL HIGH (ref 0.44–1.00)
GFR, Estimated: 15 mL/min — ABNORMAL LOW (ref >60)
Glucose, Bld: 82 mg/dL (ref 70–99)
Potassium: 3.9 mmol/L (ref 3.5–5.1)
Sodium: 141 mmol/L (ref 135–145)

## 2022-08-14 MED ORDER — TORSEMIDE 20 MG PO TABS: 60.0000 mg | ORAL_TABLET | Freq: Every day | ORAL | 1 refills | Status: DC

## 2022-08-14 NOTE — Patient Instructions (Signed)
Labs done today. We will contact you only if your labs are abnormal.  INCREASE Torsemide to $RemoveBefo'60mg'[id number]$  (3 tablets) by mouth daily.   No other medication changes were made. Please continue all current medications as prescribed.  Your physician recommends that you schedule a follow-up appointment in: 1 week  If you have any questions or concerns before your next appointment please send [place] a message through [place] or call our office at [phone].    TO LEAVE A MESSAGE FOR THE NURSE SELECT OPTION 2, PLEASE LEAVE A MESSAGE INCLUDING: YOUR NAME DATE OF BIRTH CALL BACK NUMBER REASON FOR CALL**this is important as we prioritize the call backs  YOU WILL RECEIVE A CALL BACK THE SAME DAY AS LONG AS YOU CALL BEFORE 4:00 PM   Do the following things EVERYDAY: Weigh yourself in the morning before breakfast. Write it down and keep it in a log. Take your medicines as prescribed Eat low salt foods--Limit salt (sodium) to 2000 mg per day.  Stay as active as you can everyday Limit all fluids for the day to less than 2 liters   At the [facility], you and your health needs are our priority. As part of our continuing mission to provide you with exceptional heart care, we have created designated Provider Care Teams. These Care Teams include your primary Cardiologist (physician) and Advanced Practice Providers (APPs- Physician Assistants and Nurse Practitioners) who all work together to provide you with the care you need, when you need it.   You may see any of the following providers on your designated Care Team at your next follow up: Dr Glori Bickers Dr Haynes Kerns, NP Lyda Jester, [place] Audry Riles, PharmD   Please be sure to bring in all your medications bottles to every appointment.

## 2022-08-14 NOTE — Progress Notes (Signed)
ReDS Vest / Clip - 08/14/22 0900       ReDS Vest / Clip   Station Marker C    Ruler Value 29    ReDS Value Range High volume overload    ReDS Actual Value 49

## 2022-08-15 DIAGNOSIS — N184 Chronic kidney disease, stage 4 (severe): Secondary | ICD-10-CM | POA: Diagnosis not present

## 2022-08-15 DIAGNOSIS — I472 Ventricular tachycardia, unspecified: Secondary | ICD-10-CM

## 2022-08-15 DIAGNOSIS — R809 Proteinuria, unspecified: Secondary | ICD-10-CM | POA: Diagnosis not present

## 2022-08-15 DIAGNOSIS — D631 Anemia in chronic kidney disease: Secondary | ICD-10-CM | POA: Diagnosis not present

## 2022-08-15 DIAGNOSIS — N2581 Secondary hyperparathyroidism of renal origin: Secondary | ICD-10-CM | POA: Diagnosis not present

## 2022-08-15 DIAGNOSIS — I10 Essential (primary) hypertension: Secondary | ICD-10-CM | POA: Diagnosis not present

## 2022-08-15 DIAGNOSIS — N189 Chronic kidney disease, unspecified: Secondary | ICD-10-CM | POA: Diagnosis not present

## 2022-08-22 ENCOUNTER — Telehealth (HOSPITAL_COMMUNITY): Payer: Self-pay

## 2022-08-22 NOTE — Telephone Encounter (Signed)
Called to confirm Heart & Vascular Transitions of Care appointment at 08/23/22.   Left message to confirm appointment.

## 2022-08-23 ENCOUNTER — Emergency Department (HOSPITAL_BASED_OUTPATIENT_CLINIC_OR_DEPARTMENT_OTHER)
Admission: EM | Admit: 2022-08-23 | Discharge: 2022-08-23 | Disposition: A | Discharge status: Home / Self Care | Payer: BC Managed Care – PPO | Attending: Emergency Medicine | Admitting: Emergency Medicine

## 2022-08-23 ENCOUNTER — Ambulatory Visit (HOSPITAL_COMMUNITY): Payer: BC Managed Care – PPO

## 2022-08-23 ENCOUNTER — Encounter (HOSPITAL_BASED_OUTPATIENT_CLINIC_OR_DEPARTMENT_OTHER): Payer: Self-pay

## 2022-08-23 ENCOUNTER — Emergency Department (HOSPITAL_BASED_OUTPATIENT_CLINIC_OR_DEPARTMENT_OTHER): Payer: BC Managed Care – PPO | Admitting: Radiology

## 2022-08-23 ENCOUNTER — Emergency Department (HOSPITAL_BASED_OUTPATIENT_CLINIC_OR_DEPARTMENT_OTHER): Payer: BC Managed Care – PPO

## 2022-08-23 ENCOUNTER — Other Ambulatory Visit: Payer: Self-pay

## 2022-08-23 DIAGNOSIS — E1122 Type 2 diabetes mellitus with diabetic chronic kidney disease: Secondary | ICD-10-CM | POA: Diagnosis not present

## 2022-08-23 DIAGNOSIS — R778 Other specified abnormalities of plasma proteins: Secondary | ICD-10-CM | POA: Diagnosis not present

## 2022-08-23 DIAGNOSIS — I160 Hypertensive urgency: Secondary | ICD-10-CM | POA: Diagnosis not present

## 2022-08-23 DIAGNOSIS — R1110 Vomiting, unspecified: Secondary | ICD-10-CM | POA: Diagnosis not present

## 2022-08-23 DIAGNOSIS — E1143 Type 2 diabetes mellitus with diabetic autonomic (poly)neuropathy: Secondary | ICD-10-CM | POA: Insufficient documentation

## 2022-08-23 DIAGNOSIS — I509 Heart failure, unspecified: Secondary | ICD-10-CM | POA: Insufficient documentation

## 2022-08-23 DIAGNOSIS — R7989 Other specified abnormal findings of blood chemistry: Secondary | ICD-10-CM | POA: Diagnosis not present

## 2022-08-23 DIAGNOSIS — R1033 Periumbilical pain: Secondary | ICD-10-CM | POA: Diagnosis not present

## 2022-08-23 DIAGNOSIS — N184 Chronic kidney disease, stage 4 (severe): Secondary | ICD-10-CM | POA: Diagnosis not present

## 2022-08-23 DIAGNOSIS — E1165 Type 2 diabetes mellitus with hyperglycemia: Secondary | ICD-10-CM | POA: Diagnosis not present

## 2022-08-23 DIAGNOSIS — Z79899 Other long term (current) drug therapy: Secondary | ICD-10-CM | POA: Insufficient documentation

## 2022-08-23 DIAGNOSIS — I130 Hypertensive heart and chronic kidney disease with heart failure and stage 1 through stage 4 chronic kidney disease, or unspecified chronic kidney disease: Secondary | ICD-10-CM | POA: Insufficient documentation

## 2022-08-23 DIAGNOSIS — R000 Tachycardia, unspecified: Secondary | ICD-10-CM | POA: Diagnosis not present

## 2022-08-23 DIAGNOSIS — Z794 Long term (current) use of insulin: Secondary | ICD-10-CM | POA: Diagnosis not present

## 2022-08-23 DIAGNOSIS — I161 Hypertensive emergency: Secondary | ICD-10-CM | POA: Insufficient documentation

## 2022-08-23 DIAGNOSIS — K3184 Gastroparesis: Secondary | ICD-10-CM | POA: Diagnosis not present

## 2022-08-23 LAB — CBC WITH DIFFERENTIAL/PLATELET
Abs Immature Granulocytes: 0.03 10*3/uL (ref 0.00–0.07)
Basophils Absolute: 0 10*3/uL (ref 0.0–0.1)
Basophils Relative: 0 %
Eosinophils Absolute: 0 10*3/uL (ref 0.0–0.5)
Eosinophils Relative: 0 %
HCT: 34.2 % — ABNORMAL LOW (ref 36.0–46.0)
Hemoglobin: 11.4 g/dL — ABNORMAL LOW (ref 12.0–15.0)
Immature Granulocytes: 0 %
Lymphocytes Relative: 11 %
Lymphs Abs: 0.8 10*3/uL (ref 0.7–4.0)
MCH: 28.4 pg (ref 26.0–34.0)
MCHC: 33.3 g/dL (ref 30.0–36.0)
MCV: 85.3 fL (ref 80.0–100.0)
Monocytes Absolute: 0.1 10*3/uL (ref 0.1–1.0)
Monocytes Relative: 1 %
Neutro Abs: 6.2 10*3/uL (ref 1.7–7.7)
Neutrophils Relative %: 88 %
Platelets: 316 10*3/uL (ref 150–400)
RBC: 4.01 MIL/uL (ref 3.87–5.11)
RDW: 14.9 % (ref 11.5–15.5)
WBC: 7.1 10*3/uL (ref 4.0–10.5)
nRBC: 0 % (ref 0.0–0.2)

## 2022-08-23 LAB — TROPONIN I (HIGH SENSITIVITY)
Troponin I (High Sensitivity): 26 ng/L — ABNORMAL HIGH (ref <18)
Troponin I (High Sensitivity): 32 ng/L — ABNORMAL HIGH (ref <18)

## 2022-08-23 LAB — URINALYSIS, ROUTINE W REFLEX MICROSCOPIC
Bacteria, UA: NONE SEEN
Bilirubin Urine: NEGATIVE
Glucose, UA: 100 mg/dL — AB
Ketones, ur: NEGATIVE mg/dL
Leukocytes,Ua: NEGATIVE
Nitrite: NEGATIVE
Protein, ur: >300 mg/dL — AB
Specific Gravity, Urine: 1.017 (ref 1.005–1.030)
pH: 7 (ref 5.0–8.0)

## 2022-08-23 LAB — COMPREHENSIVE METABOLIC PANEL
ALT: 13 U/L (ref 0–44)
AST: 12 U/L — ABNORMAL LOW (ref 15–41)
Albumin: 4.1 g/dL (ref 3.5–5.0)
Alkaline Phosphatase: 103 U/L (ref 38–126)
Anion gap: 14 (ref 5–15)
BUN: 38 mg/dL — ABNORMAL HIGH (ref 6–20)
CO2: 22 mmol/L (ref 22–32)
Calcium: 10.2 mg/dL (ref 8.9–10.3)
Chloride: 106 mmol/L (ref 98–111)
Creatinine, Ser: 3.4 mg/dL — ABNORMAL HIGH (ref 0.44–1.00)
GFR, Estimated: 16 mL/min — ABNORMAL LOW (ref >60)
Glucose, Bld: 176 mg/dL — ABNORMAL HIGH (ref 70–99)
Potassium: 3.8 mmol/L (ref 3.5–5.1)
Sodium: 142 mmol/L (ref 135–145)
Total Bilirubin: 1 mg/dL (ref 0.3–1.2)
Total Protein: 8.2 g/dL — ABNORMAL HIGH (ref 6.5–8.1)

## 2022-08-23 LAB — CBG MONITORING, ED
Glucose-Capillary: 140 mg/dL — ABNORMAL HIGH (ref 70–99)
Glucose-Capillary: 178 mg/dL — ABNORMAL HIGH (ref 70–99)

## 2022-08-23 LAB — RAPID URINE DRUG SCREEN, HOSP PERFORMED
Amphetamines: NOT DETECTED
Barbiturates: NOT DETECTED
Benzodiazepines: NOT DETECTED
Cocaine: NOT DETECTED
Opiates: NOT DETECTED
Tetrahydrocannabinol: NOT DETECTED

## 2022-08-23 LAB — LIPASE, BLOOD: Lipase: <10 U/L — ABNORMAL LOW (ref 11–51)

## 2022-08-23 MED ORDER — LORAZEPAM 1 MG PO TABS: 0.5000 mg | ORAL_TABLET | Freq: Once | ORAL | Status: DC

## 2022-08-23 MED ORDER — SODIUM CHLORIDE 0.9 % IV SOLN
12.5000 mg | Freq: Once | INTRAVENOUS | Status: AC
  Administered 2022-08-23: 12.5 mg via INTRAVENOUS
  Filled 2022-08-23: qty 0.5

## 2022-08-23 MED ORDER — PROMETHAZINE HCL 25 MG/ML IJ SOLN
INTRAMUSCULAR | Status: AC
  Filled 2022-08-23: qty 1

## 2022-08-23 MED ORDER — METOCLOPRAMIDE HCL 5 MG/ML IJ SOLN
10.0000 mg | Freq: Once | INTRAMUSCULAR | Status: AC
  Administered 2022-08-23: 10 mg via INTRAVENOUS
  Filled 2022-08-23: qty 2

## 2022-08-23 MED ORDER — LORAZEPAM 2 MG/ML IJ SOLN
1.0000 mg | Freq: Once | INTRAMUSCULAR | Status: AC
  Administered 2022-08-23: 1 mg via INTRAVENOUS
  Filled 2022-08-23: qty 1

## 2022-08-23 MED ORDER — DIPHENHYDRAMINE HCL 50 MG/ML IJ SOLN
25.0000 mg | Freq: Once | INTRAMUSCULAR | Status: AC
  Administered 2022-08-23: 25 mg via INTRAVENOUS
  Filled 2022-08-23: qty 1

## 2022-08-23 MED ORDER — LABETALOL HCL 5 MG/ML IV SOLN
10.0000 mg | Freq: Once | INTRAVENOUS | Status: AC
  Administered 2022-08-23: 10 mg via INTRAVENOUS
  Filled 2022-08-23: qty 4

## 2022-08-23 MED ORDER — SODIUM CHLORIDE 0.9 % IV BOLUS
500.0000 mL | Freq: Once | INTRAVENOUS | Status: AC
  Administered 2022-08-23: 500 mL via INTRAVENOUS

## 2022-08-23 NOTE — Discharge Instructions (Addendum)
Please follow-up with PCP. Your heart enzymes were slightly elevated today this is likely secondary to your kidney disease and elevated blood pressure. If you develop chest pain, severe abdominal pain, or intractable nausea or vomiting please return to the ED.

## 2022-08-23 NOTE — ED Provider Notes (Signed)
[facility] Provider Note   CSN: [id number] Arrival date & time: 08/23/22  1417     History  Chief Complaint  Patient presents with   Emesis   Abdominal Pain    [name] is a 52 y.o. female, history of diabetes, CHF, CKD, who presents to the ED secondary to umbilical pain for the last day, history of gastroparesis per patient, she states she is feels like she is one of her other flares.  Has been vomiting for the last day, and cannot keep anything down including her medications for her blood pressure.  Has taken home Zofran, Phenergan, and Reglan without any improvement of her symptoms.  Notes that the abdominal pain is all over, and that there are no alleviating factors or aggravating factors.  Denies any fevers, chills, upper respiratory infectious symptoms, or diarrhea.  Denies any chest pain or shortness of breath.     Home Medications Prior to Admission medications   Medication Sig Start Date End Date Taking? Authorizing Provider  acetaminophen (TYLENOL) 500 MG tablet Take 1,000 mg by mouth 3 (three) times daily as needed for moderate pain.    [provider]  albuterol (PROAIR HFA) 108 (90 Base) MCG/ACT inhaler Inhale 2 puffs into the lungs every 6 (six) hours as needed for wheezing or shortness of breath. 07/03/20   [name], PA-C  Carboxymethylcellul-Glycerin (LUBRICATING EYE DROPS OP) Place 1-2 drops into both eyes daily as needed (dry eyes).    [provider]  Continuous Blood Gluc Sensor (FREESTYLE LIBRE 2 SENSOR) MISC Inject 1 Device into the skin every 14 (fourteen) days. 03/20/20   [provider]  Ensure Max Protein (ENSURE MAX PROTEIN) LIQD Take 330 mLs (11 oz total) by mouth daily. 04/30/22   Hongalgi, [name], MD  hydrALAZINE (APRESOLINE) 50 MG tablet Take 1 tablet (50 mg total) by mouth 3 (three) times daily. 07/17/22   Domenic Polite, MD  hydrOXYzine (ATARAX) 50 MG tablet Take 50 mg  by mouth in the morning, at noon, and at bedtime.    [provider]  insulin aspart (NOVOLOG) 100 UNIT/ML injection Inject into the skin See admin instructions. Patient uses this with the [place].  Units vary according to blood sugar.    [provider]  LORazepam (ATIVAN) 0.5 MG tablet Take 1 tablet (0.5 mg total) by mouth every 8 (eight) hours as needed for anxiety (Refractory nausea and vomiting). 07/17/22   Domenic Polite, MD  metoCLOPramide (REGLAN) 10 MG tablet Take 1 tablet (10 mg total) by mouth 3 (three) times daily before meals. 07/17/22   Domenic Polite, MD  metoprolol succinate (TOPROL XL) 25 MG 24 hr tablet Take 1 tablet (25 mg total) by mouth daily. 04/19/22   [name], PA-C  Multiple Vitamin (MULTIVITAMIN WITH MINERALS) TABS tablet Take 1 tablet by mouth daily. 04/30/22   Hongalgi, [name], MD  pantoprazole (PROTONIX) 40 MG tablet Take 1 tablet (40 mg total) by mouth 2 (two) times daily before a meal. 05/24/22 07/23/22  Rai, Ripudeep K, MD  polyethylene glycol (MIRALAX / GLYCOLAX) 17 g packet Take 17 g by mouth daily as needed for moderate constipation.    [provider]  prochlorperazine (COMPAZINE) 10 MG tablet Take 1 tablet (10 mg total) by mouth every 6 (six) hours as needed for nausea or vomiting. 05/24/22   Rai, [name], MD  promethazine (PHENERGAN) 25 MG suppository Unwrap and place 1 suppository (25 mg total) rectally  every 6 (six) hours as needed for nausea or vomiting. 07/17/22   Domenic Polite, MD  rosuvastatin (CRESTOR) 20 MG tablet Take 1 tablet (20 mg total) by mouth daily. 05/24/22 09/21/22  Rai, [name], MD  scopolamine (TRANSDERM-SCOP) 1 MG/3DAYS Place 1 patch (1.5 mg total) onto the skin every 3 (three) days. 05/26/22   Rai, Ripudeep K, MD  tobramycin (TOBREX) 0.3 % ophthalmic solution Place 1-2 drops into both eyes See admin instructions. Instill 1-2 drops into both eyes 3 times daily the day before, day of, and the day  after every 6 weeks eye injections per patient 11/17/20   [provider]  torsemide (DEMADEX) 20 MG tablet Take 3 tablets (60 mg total) by mouth daily. 08/14/22   Clegg, Amy D, NP      Allergies    Atorvastatin, Dulaglutide, Zofran [ondansetron], Amoxicillin, Lantus [insulin glargine], and Miconazole nitrate    Review of Systems   Review of Systems  Gastrointestinal:  Positive for abdominal pain, nausea and vomiting. Negative for blood in stool, constipation and diarrhea.    Physical Exam Updated Vital Signs BP 124/76   Pulse 92   Temp 98.6 F (37 C) (Oral)   Resp (!) 22   LMP 11/17/2021 (Approximate)   SpO2 100%  Physical Exam Vitals and nursing note reviewed.  Constitutional:      General: She is not in acute distress.    Appearance: She is well-developed.  HENT:     Head: Normocephalic and atraumatic.  Eyes:     Conjunctiva/sclera: Conjunctivae normal.  Cardiovascular:     Rate and Rhythm: Regular rhythm. Tachycardia present.     Heart sounds: No murmur heard. Pulmonary:     Effort: Pulmonary effort is normal. No respiratory distress.     Breath sounds: Normal breath sounds.  Abdominal:     Palpations: Abdomen is soft.     Tenderness: There is generalized abdominal tenderness. There is no guarding or rebound.  Musculoskeletal:        General: No swelling.     Cervical back: Neck supple.  Skin:    General: Skin is warm and dry.     Capillary Refill: Capillary refill takes less than 2 seconds.  Neurological:     Mental Status: She is alert.  Psychiatric:        Mood and Affect: Mood normal.     ED Results / Procedures / Treatments   Labs (all labs ordered are listed, but only abnormal results are displayed) Labs Reviewed  COMPREHENSIVE METABOLIC PANEL - Abnormal; Notable for the following components:      Result Value   Glucose, Bld 176 (*)    BUN 38 (*)    Creatinine, Ser 3.40 (*)    Total Protein 8.2 (*)    AST 12 (*)    GFR, Estimated 16 (*)     All other components within normal limits  CBC WITH DIFFERENTIAL/PLATELET - Abnormal; Notable for the following components:   Hemoglobin 11.4 (*)    HCT 34.2 (*)    All other components within normal limits  LIPASE, BLOOD - Abnormal; Notable for the following components:   Lipase <10 (*)    All other components within normal limits  URINALYSIS, ROUTINE W REFLEX MICROSCOPIC - Abnormal; Notable for the following components:   Glucose, UA 100 (*)    Hgb urine dipstick Ronnie Mallette (*)    Protein, ur >300 (*)    All other components within normal limits  CBG MONITORING, ED -  Abnormal; Notable for the following components:   Glucose-Capillary 140 (*)    All other components within normal limits  CBG MONITORING, ED - Abnormal; Notable for the following components:   Glucose-Capillary 178 (*)    All other components within normal limits  TROPONIN I (HIGH SENSITIVITY) - Abnormal; Notable for the following components:   Troponin I (High Sensitivity) 26 (*)    All other components within normal limits  TROPONIN I (HIGH SENSITIVITY) - Abnormal; Notable for the following components:   Troponin I (High Sensitivity) 32 (*)    All other components within normal limits  RAPID URINE DRUG SCREEN, HOSP PERFORMED    EKG EKG Interpretation  Date/Time:  Friday August 23 2022 14:52:13 EST Ventricular Rate:  104 PR Interval:  148 QRS Duration: 74 QT Interval:  336 QTC Calculation: 441 R Axis:   78 Text Interpretation: Sinus tachycardia When compared with ECG of 06-Aug-2022 12:03, T wave inversion less evident in Lateral leads no sig change from previous Confirmed by Charlesetta Shanks [phone]) on 08/23/2022 3:07:58 PM  Radiology DG Abdomen 1 View  Result Date: 08/23/2022 CLINICAL DATA:  Intractable vomiting EXAM: ABDOMEN - 1 VIEW COMPARISON:  07/21/2022 FINDINGS: Inferior pelvis excluded from view. Normal abdominal gas pattern. No gross free intraperitoneal gas. Stable phleboliths within the pelvis  bilaterally. No definite renal or ureteral calculi. Osseous structures are unremarkable. IMPRESSION: 1. Negative. Electronically Signed   By: Fidela Salisbury M.D.   On: 08/23/2022 19:49    Procedures Procedures   Medications Ordered in ED Medications  promethazine (PHENERGAN) 25 MG/ML injection (  Not Given 08/23/22 2048)  metoCLOPramide (REGLAN) injection 10 mg (10 mg Intravenous Given 08/23/22 1757)  diphenhydrAMINE (BENADRYL) injection 25 mg (25 mg Intravenous Given 08/23/22 1756)  sodium chloride 0.9 % bolus 500 mL (0 mLs Intravenous Stopped 08/23/22 2000)  labetalol (NORMODYNE) injection 10 mg (10 mg Intravenous Given 08/23/22 1759)  promethazine (PHENERGAN) 12.5 mg in sodium chloride 0.9 % 50 mL IVPB (0 mg Intravenous Stopped 08/23/22 2027)  LORazepam (ATIVAN) injection 1 mg (1 mg Intravenous Given 08/23/22 2030)    ED Course/ Medical Decision Making/ A&P  :                          Medical Decision Making Patient is a 52 year old female, history of diabetic gastroparesis, who presents to the ED secondary to nausea, vomiting, generalized abdominal pain for the last day.  She states that she has tried her Phenergan, Zofran, and Reglan at home without any relief.  Feels like she has another flare.  Refuses CT abdomen pelvis for further evaluation for generalized pain.  Will obtain a KUB to rule out any kind of obstruction, and basic labs for further evaluation.  Triage ordered troponins.  She denies any chest pain or shortness of breath on exam to me.  Amount and/or Complexity of Data Reviewed Labs: ordered.    Details: Troponin elevated at 26 to 32, likely secondary to CKD 4, hypertensive emergency.  Creatinine of 3.4 which is a standard/baseline for her.  Radiology: ordered.    Details: KUB unremarkable--no bowel obstruction ECG/medicine tests:  Decision-making details documented in ED Course. Discussion of management or test interpretation with external provider(s): After multiple  antiemetics, Ativan the patient stopped vomiting and states abdominal pain resolved.  Troponins are elevated however I believe this is likely secondary to her hypertensive emergency which is now resolved after labetalol, likely uncontrolled hypertension/hypertensive emergency with secondary to  inability to take blood pressure medications today along with the tachycardia, as she takes beta-blocker.  Her symptoms have now resolved, and she denies any chest pain or shortness of breath when I initially asked her and at the end.  She did require oxygen after Ativan, and multiple antibiotics, and she slept for good period of time, and woke up with resolved symptoms.  I discussed with her return precautions and she has antiemetics at home, she voiced understanding and was agreeable with discharge.  She denied any chest pain, shortness of breath, headache at the beginning of evaluation and at discharge.  Risk Prescription drug management.    Final Clinical Impression(s) / ED Diagnoses Final diagnoses:  Gastroparesis  Elevated troponin  Hypertensive emergency    Rx / DC Orders ED Discharge Orders     None         Lamyah Creed, Si Gaul, PA 08/23/22 2342    Charlesetta Shanks, MD 08/24/22 0040

## 2022-08-23 NOTE — ED Notes (Signed)
Discharge instructions discussed w/pt. Pt verbalized understanding. Pt given paper scrub pants and mesh underwear. Pt calling for ride.

## 2022-08-23 NOTE — ED Notes (Signed)
After Ativan admin, pt's SpO2 decreased to 87%, pt was placed on O2 via [place] at 1L which increased Spo2 to WNL. Pt maintaining SpO2 WNL on 1L O2 via [place] at present.

## 2022-08-23 NOTE — ED Triage Notes (Signed)
Pt c/o emesis since 6a; hx gastroparesis, endorses generalized "sharp" abd pain. States she took reglan, zofran, "everything I could" for symptoms, no help.

## 2022-08-23 NOTE — Progress Notes (Incomplete)
HEART IMPACT TRANSITIONS OF CARE    PCP: Jolene Schimke PA Primary Cardiologist: Former patient of Dr Tamala Julian  EP: Dr [facility]  HPI:  52 y.o. female with history of DMII with gastroparesis, CKD stage IIIB, nonischemic cardiomyopathy and chronic diastolic HF and recent VF arrest   She was admitted 9/23 after witnessed arrest at [place] with bystander CPR and AED shock in the field with successful return of ROSC. Echo showed LVEF 50-55%, no significant valve disease. EKG w/o acute ischemic changes and HS trop were flat and not c/w ACS. Of note, she had had a coronary CTA in 2021 that showed no CAD and Ca score of 0. Given absence of chest pain, recent reassuring imaging and AKI on CKD, repeat ischemic evaluation was deferred. She underwent ICD placement with EP. No recurrent VT on the monitor  Re-admitted 12/08-12/13/23 with acute on chronic HFpEF, chest pain (frequent since CPR several months prior), recurrent N/V and hypertensive urgency.  She was diuresed with IV lasix. Not discharged on diuretic d/t variable PO intake with frequent N/V.  GDMT limited by CKD IV.   Presented to [facility] ED 07/22/22 with N, V and dehydration. Had vomited during gastric emptying study several days prior. Has been following with [facility]. ICD interrogated - normal function, no device discharges. She was tachycardic and HS troponin slightly elevated 189>198>226>278. Cardiology consulted. No ischemic workup recommended d/t calcium score of 0 in 2021 and Scr of 3. Dose of beta blocker increased. Referred to [facility].   She presents back today for f/u. At last visit 1/10, volume status was elevated. Reds Clip 49%. Optivol suggestive of fluid accumulation. Torsemide was increased to 60 mg daily.  Instructed to return back for volume reassessment.            Optivol - No VT/AF. Activity ~1 hour per day. Impedance down. Fluid index above threshold.  ROS: All systems negative except as listed in  HPI, PMH and Problem List.  SH:  Social History   Socioeconomic History   Marital status: Divorced    Spouse name: Not on file   Number of children: 2   Years of education: Not on file   Highest education level: Not on file  Occupational History   Occupation: Freight forwarder  Tobacco Use   Smoking status: Never   Smokeless tobacco: Never  Vaping Use   Vaping Use: Never used  Substance and Sexual Activity   Alcohol use: Not Currently   Drug use: No   Sexual activity: Yes    Partners: Male    Birth control/protection: None  Other Topics Concern   Not on file  Social History Narrative   Caffeine use: daily   Occupation: cleans houses   Regular exercise: yes, active 5x/wk   Diet: good water, fruits/vegetables daily   Social Determinants of Health   Financial Resource Strain: Low Risk  (07/15/2022)   Overall Financial Resource Strain (CARDIA)    Difficulty of Paying Living Expenses: Not very hard  Food Insecurity: No Food Insecurity (07/22/2022)   Hunger Vital Sign    Worried About Running Out of Food in the Last Year: Never true    [place] in the Last Year: Never true  Recent Concern: Food Insecurity - Food Insecurity Present (04/23/2022)   Hunger Vital Sign    Worried About [place] in the Last Year: Sometimes true    Ran Out of Food in the Last Year: Sometimes true  Transportation Needs: No Transportation Needs (07/22/2022)   PRAPARE - Hydrologist (Medical): No    Lack of Transportation (Non-Medical): No  Physical Activity: Not on file  Stress: Not on file  Social Connections: Not on file  Intimate Partner Violence: Not At Risk (07/22/2022)   Humiliation, Afraid, Rape, and Kick questionnaire    Fear of Current or Ex-Partner: No    Emotionally Abused: No    Physically Abused: No    Sexually Abused: No    FH:  Family History  Problem Relation Age of Onset   Diabetes Mother        type 2   Hypertension Mother     Thyroid disease Mother    Bipolar disorder Mother    Heart disease Mother    Calcium disorder Mother    Cancer Maternal Grandmother        Breast, stomach   Cancer Paternal Grandmother        stomach, lung (smoker)   Diabetes Paternal Grandmother    Diabetes Paternal Grandfather    Heart failure Sister    Diabetes Sister    Stroke Maternal Aunt    Cancer Maternal Uncle        prostate   CAD Maternal Aunt        stents   Cancer Maternal Aunt 64       ovarian    Past Medical History:  Diagnosis Date   Anemia    Anxiety    Arthritis    Asthma    CHF (congestive heart failure) ([place])    Depression    Diabetic ulcer of left foot ([place]) 05/12/2013   Gastroparesis    Generalized abdominal pain    History of chicken pox    Loss of weight 12/02/2019   Migraines    Mood disorder (HCC)    anxiety   Prurigo nodularis    with diabetic dermopathy   Type 1 diabetes, uncontrolled, with neuropathy    [place]   Ulcers of both lower legs ([place]) 02/20/2014    Current Outpatient Medications  Medication Sig Dispense Refill   acetaminophen (TYLENOL) 500 MG tablet Take 1,000 mg by mouth 3 (three) times daily as needed for moderate pain.     albuterol (PROAIR HFA) 108 (90 Base) MCG/ACT inhaler Inhale 2 puffs into the lungs every 6 (six) hours as needed for wheezing or shortness of breath. 6.7 g 1   Carboxymethylcellul-Glycerin (LUBRICATING EYE DROPS OP) Place 1-2 drops into both eyes daily as needed (dry eyes).     Continuous Blood Gluc Sensor (FREESTYLE LIBRE 2 SENSOR) MISC Inject 1 Device into the skin every 14 (fourteen) days.     Ensure Max Protein (ENSURE MAX PROTEIN) LIQD Take 330 mLs (11 oz total) by mouth daily.     hydrALAZINE (APRESOLINE) 50 MG tablet Take 1 tablet (50 mg total) by mouth 3 (three) times daily. 90 tablet 1   hydrOXYzine (ATARAX) 50 MG tablet Take 50 mg by mouth in the morning, at noon, and at bedtime.     insulin aspart (NOVOLOG) 100 UNIT/ML injection Inject into the  skin See admin instructions. Patient uses this with the [place].  Units vary according to blood sugar.     LORazepam (ATIVAN) 0.5 MG tablet Take 1 tablet (0.5 mg total) by mouth every 8 (eight) hours as needed for anxiety (Refractory nausea and vomiting). 30 tablet 0   metoCLOPramide (REGLAN) 10 MG tablet Take 1 tablet (10 mg total) by  mouth 3 (three) times daily before meals. 90 tablet 0   metoprolol succinate (TOPROL XL) 25 MG 24 hr tablet Take 1 tablet (25 mg total) by mouth daily. 90 tablet 3   Multiple Vitamin (MULTIVITAMIN WITH MINERALS) TABS tablet Take 1 tablet by mouth daily.     pantoprazole (PROTONIX) 40 MG tablet Take 1 tablet (40 mg total) by mouth 2 (two) times daily before a meal. 60 tablet 1   polyethylene glycol (MIRALAX / GLYCOLAX) 17 g packet Take 17 g by mouth daily as needed for moderate constipation.     prochlorperazine (COMPAZINE) 10 MG tablet Take 1 tablet (10 mg total) by mouth every 6 (six) hours as needed for nausea or vomiting. 30 tablet 1   promethazine (PHENERGAN) 25 MG suppository Unwrap and place 1 suppository (25 mg total) rectally every 6 (six) hours as needed for nausea or vomiting. 30 each 2   rosuvastatin (CRESTOR) 20 MG tablet Take 1 tablet (20 mg total) by mouth daily. 30 tablet 3   scopolamine (TRANSDERM-SCOP) 1 MG/3DAYS Place 1 patch (1.5 mg total) onto the skin every 3 (three) days. 24 patch 4   tobramycin (TOBREX) 0.3 % ophthalmic solution Place 1-2 drops into both eyes See admin instructions. Instill 1-2 drops into both eyes 3 times daily the day before, day of, and the day after every 6 weeks eye injections per patient     torsemide (DEMADEX) 20 MG tablet Take 3 tablets (60 mg total) by mouth daily. 90 tablet 1   No current facility-administered medications for this visit.    There were no vitals filed for this visit.   Wt Readings from Last 3 Encounters:  08/14/22 85.7 kg (189 lb)  08/06/22 87.5 kg (193 lb)  07/24/22 82.1 kg (181 lb)   PHYSICAL  EXAM: General: Appears weak . No resp difficulty HEENT: normal Neck: supple. JVP 9-10 . Carotids 2+ bilaterally; no bruits. No lymphadenopathy or thryomegaly appreciated. Cor: PMI normal. Regular rate & rhythm. No rubs, gallops or murmurs. Lungs: clear Abdomen: soft, nontender, nondistended. No hepatosplenomegaly. No bruits or masses. Good bowel sounds. Extremities: no cyanosis, clubbing, rash, trace lower extremity edema Neuro: alert & orientedx3, cranial nerves grossly intact. Moves all 4 extremities w/o difficulty. Affect pleasant.   ASSESSMENT & PLAN:  HFpEF -Echo 09/23: EF 50-55%, RV okay, severe LAE Reds CLip 49% NYHA III GDMT  Volume status elevated. Reds Clip 49%. Optivol suggestive of fluid accumulation. I discussed interrogation. Increase torsemide to 60 mg daily.  BB-Metoprolol xl 25 mg daily Ace/ARB/ARNI-No CKD MRA-No CKD SGLT2i-No, DM I - Check BMET today and next week.  - Discussed low salt food choices. Limiting sodium. Difficult to limit fluids with ongoing N/V issues.    Hx VT/VF arrest -[facility] Witnessed arrested 09/23, s/p single chamber MDT ICD -Needs f/u EP. We will set up .     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  Follow-up with [facility] later this week.    HTN May need to increase hydralazine next visit but   Gastroparesis Had f/u with GI next month.    Follow up next week to reassess volume status.  Ashlon Lottman PA-C  8:09 AM

## 2022-08-26 ENCOUNTER — Other Ambulatory Visit: Payer: Self-pay

## 2022-08-26 ENCOUNTER — Emergency Department (HOSPITAL_COMMUNITY)
Admission: EM | Admit: 2022-08-26 | Discharge: 2022-08-27 | Disposition: A | Discharge status: Home / Self Care | Payer: BC Managed Care – PPO | Attending: Emergency Medicine | Admitting: Emergency Medicine

## 2022-08-26 DIAGNOSIS — R531 Weakness: Secondary | ICD-10-CM | POA: Diagnosis not present

## 2022-08-26 DIAGNOSIS — Z79899 Other long term (current) drug therapy: Secondary | ICD-10-CM | POA: Insufficient documentation

## 2022-08-26 DIAGNOSIS — I130 Hypertensive heart and chronic kidney disease with heart failure and stage 1 through stage 4 chronic kidney disease, or unspecified chronic kidney disease: Secondary | ICD-10-CM | POA: Insufficient documentation

## 2022-08-26 DIAGNOSIS — K3184 Gastroparesis: Secondary | ICD-10-CM | POA: Insufficient documentation

## 2022-08-26 DIAGNOSIS — I509 Heart failure, unspecified: Secondary | ICD-10-CM | POA: Insufficient documentation

## 2022-08-26 DIAGNOSIS — E1165 Type 2 diabetes mellitus with hyperglycemia: Secondary | ICD-10-CM | POA: Insufficient documentation

## 2022-08-26 DIAGNOSIS — E1065 Type 1 diabetes mellitus with hyperglycemia: Secondary | ICD-10-CM | POA: Insufficient documentation

## 2022-08-26 DIAGNOSIS — R110 Nausea: Secondary | ICD-10-CM | POA: Diagnosis not present

## 2022-08-26 DIAGNOSIS — Z794 Long term (current) use of insulin: Secondary | ICD-10-CM | POA: Diagnosis not present

## 2022-08-26 DIAGNOSIS — R000 Tachycardia, unspecified: Secondary | ICD-10-CM | POA: Insufficient documentation

## 2022-08-26 DIAGNOSIS — E1043 Type 1 diabetes mellitus with diabetic autonomic (poly)neuropathy: Secondary | ICD-10-CM | POA: Diagnosis not present

## 2022-08-26 DIAGNOSIS — E1143 Type 2 diabetes mellitus with diabetic autonomic (poly)neuropathy: Secondary | ICD-10-CM | POA: Insufficient documentation

## 2022-08-26 DIAGNOSIS — E1022 Type 1 diabetes mellitus with diabetic chronic kidney disease: Secondary | ICD-10-CM | POA: Insufficient documentation

## 2022-08-26 DIAGNOSIS — R112 Nausea with vomiting, unspecified: Secondary | ICD-10-CM | POA: Diagnosis not present

## 2022-08-26 DIAGNOSIS — R1110 Vomiting, unspecified: Secondary | ICD-10-CM | POA: Diagnosis not present

## 2022-08-26 DIAGNOSIS — E1122 Type 2 diabetes mellitus with diabetic chronic kidney disease: Secondary | ICD-10-CM | POA: Insufficient documentation

## 2022-08-26 DIAGNOSIS — N189 Chronic kidney disease, unspecified: Secondary | ICD-10-CM | POA: Insufficient documentation

## 2022-08-26 DIAGNOSIS — R1111 Vomiting without nausea: Secondary | ICD-10-CM | POA: Diagnosis not present

## 2022-08-26 LAB — BLOOD GAS, VENOUS
Acid-Base Excess: 2.2 mmol/L — ABNORMAL HIGH (ref 0.0–2.0)
Bicarbonate: 25.1 mmol/L (ref 20.0–28.0)
O2 Saturation: 78 %
Patient temperature: 37
pCO2, Ven: 33 mmHg — ABNORMAL LOW (ref 44–60)
pH, Ven: 7.49 — ABNORMAL HIGH (ref 7.25–7.43)
pO2, Ven: 42 mmHg (ref 32–45)

## 2022-08-26 LAB — COMPREHENSIVE METABOLIC PANEL
ALT: 9 U/L (ref 0–44)
AST: 18 U/L (ref 15–41)
Albumin: 3.5 g/dL (ref 3.5–5.0)
Alkaline Phosphatase: 87 U/L (ref 38–126)
Anion gap: 14 (ref 5–15)
BUN: 37 mg/dL — ABNORMAL HIGH (ref 6–20)
CO2: 21 mmol/L — ABNORMAL LOW (ref 22–32)
Calcium: 9.3 mg/dL (ref 8.9–10.3)
Chloride: 105 mmol/L (ref 98–111)
Creatinine, Ser: 3.45 mg/dL — ABNORMAL HIGH (ref 0.44–1.00)
GFR, Estimated: 15 mL/min — ABNORMAL LOW (ref >60)
Glucose, Bld: 149 mg/dL — ABNORMAL HIGH (ref 70–99)
Potassium: 4.3 mmol/L (ref 3.5–5.1)
Sodium: 140 mmol/L (ref 135–145)
Total Bilirubin: 1.4 mg/dL — ABNORMAL HIGH (ref 0.3–1.2)
Total Protein: 7.4 g/dL (ref 6.5–8.1)

## 2022-08-26 LAB — CBC
HCT: 35.6 % — ABNORMAL LOW (ref 36.0–46.0)
Hemoglobin: 11.5 g/dL — ABNORMAL LOW (ref 12.0–15.0)
MCH: 28 pg (ref 26.0–34.0)
MCHC: 32.3 g/dL (ref 30.0–36.0)
MCV: 86.6 fL (ref 80.0–100.0)
Platelets: 330 10*3/uL (ref 150–400)
RBC: 4.11 MIL/uL (ref 3.87–5.11)
RDW: 14.5 % (ref 11.5–15.5)
WBC: 7.4 10*3/uL (ref 4.0–10.5)
nRBC: 0 % (ref 0.0–0.2)

## 2022-08-26 LAB — CBG MONITORING, ED: Glucose-Capillary: 141 mg/dL — ABNORMAL HIGH (ref 70–99)

## 2022-08-26 LAB — URINALYSIS, ROUTINE W REFLEX MICROSCOPIC
Bilirubin Urine: NEGATIVE
Glucose, UA: 150 mg/dL — AB
Hgb urine dipstick: NEGATIVE
Ketones, ur: 5 mg/dL — AB
Leukocytes,Ua: NEGATIVE
Nitrite: NEGATIVE
Protein, ur: >=300 mg/dL — AB
Specific Gravity, Urine: 1.019 (ref 1.005–1.030)
pH: 7 (ref 5.0–8.0)

## 2022-08-26 LAB — LIPASE, BLOOD: Lipase: 28 U/L (ref 11–51)

## 2022-08-26 LAB — BETA-HYDROXYBUTYRIC ACID: Beta-Hydroxybutyric Acid: 1.03 mmol/L — ABNORMAL HIGH (ref 0.05–0.27)

## 2022-08-26 MED ORDER — LORAZEPAM 2 MG/ML IJ SOLN
1.0000 mg | Freq: Once | INTRAMUSCULAR | Status: AC
  Administered 2022-08-26: 1 mg via INTRAVENOUS
  Filled 2022-08-26: qty 1

## 2022-08-26 MED ORDER — SODIUM CHLORIDE 0.9 % IV BOLUS
1000.0000 mL | Freq: Once | INTRAVENOUS | Status: AC
  Administered 2022-08-26: 1000 mL via INTRAVENOUS

## 2022-08-26 MED ORDER — PROMETHAZINE HCL 25 MG/ML IJ SOLN
12.5000 mg | Freq: Four times a day (QID) | INTRAMUSCULAR | Status: DC | PRN
  Administered 2022-08-26: 12.5 mg via INTRAMUSCULAR
  Filled 2022-08-26: qty 1

## 2022-08-26 NOTE — ED Triage Notes (Signed)
Pt complains of emesis, lowe abdominal pain and back pain. Pt was recently diagnosed with gastroparesis and medication at home has not been working. Pt has hx of diabetes, CBG 122 with EMS. Pt to start dialysis soon.

## 2022-08-26 NOTE — ED Provider Notes (Signed)
[place] EMERGENCY DEPARTMENT AT [facility] Provider Note   CSN: [id number] Arrival date & time: 08/26/22  2034     History  Chief Complaint  Patient presents with   Emesis   Abdominal Pain    [name] is a 52 y.o. female.  The history is provided by the patient and medical records.  Emesis Associated symptoms: abdominal pain   Abdominal Pain Associated symptoms: nausea and vomiting    52 year old female with history of CHF, diabetes, hypertension, chronic kidney disease, gastroparesis, presenting to the ED with abdominal discomfort and vomiting.  Patient states it feels like her normal gastroparesis.  She was seen a few days ago and held in the ER for about 24 hours, feeling better at that time but was returning home symptoms recurred.  She states she is not able to hold down any food or fluids at this time.  She denies any fever.  No diarrhea.  She is requesting IV fluids and Ativan after receiving Phenergan in triage.  Patient has discontinued her own insulin pump due to concerns for hypoglycemia while not eating/drinking.  CBG 140's upon arrival.  Home Medications Prior to Admission medications   Medication Sig Start Date End Date Taking? Authorizing Provider  acetaminophen (TYLENOL) 500 MG tablet Take 1,000 mg by mouth 3 (three) times daily as needed for moderate pain.    [provider]  albuterol (PROAIR HFA) 108 (90 Base) MCG/ACT inhaler Inhale 2 puffs into the lungs every 6 (six) hours as needed for wheezing or shortness of breath. 07/03/20   [name], PA-C  Carboxymethylcellul-Glycerin (LUBRICATING EYE DROPS OP) Place 1-2 drops into both eyes daily as needed (dry eyes).    [provider]  Continuous Blood Gluc Sensor (FREESTYLE LIBRE 2 SENSOR) MISC Inject 1 Device into the skin every 14 (fourteen) days. 03/20/20   [provider]  Ensure Max Protein (ENSURE MAX PROTEIN) LIQD Take 330 mLs (11 oz total) by mouth daily.  04/30/22   Hongalgi, [name], MD  hydrALAZINE (APRESOLINE) 50 MG tablet Take 1 tablet (50 mg total) by mouth 3 (three) times daily. 07/17/22   Domenic Polite, MD  hydrOXYzine (ATARAX) 50 MG tablet Take 50 mg by mouth in the morning, at noon, and at bedtime.    [provider]  insulin aspart (NOVOLOG) 100 UNIT/ML injection Inject into the skin See admin instructions. Patient uses this with the [place].  Units vary according to blood sugar.    [provider]  LORazepam (ATIVAN) 0.5 MG tablet Take 1 tablet (0.5 mg total) by mouth every 8 (eight) hours as needed for anxiety (Refractory nausea and vomiting). 07/17/22   Domenic Polite, MD  metoCLOPramide (REGLAN) 10 MG tablet Take 1 tablet (10 mg total) by mouth 3 (three) times daily before meals. 07/17/22   Domenic Polite, MD  metoprolol succinate (TOPROL XL) 25 MG 24 hr tablet Take 1 tablet (25 mg total) by mouth daily. 04/19/22   [name], PA-C  Multiple Vitamin (MULTIVITAMIN WITH MINERALS) TABS tablet Take 1 tablet by mouth daily. 04/30/22   Hongalgi, [name], MD  pantoprazole (PROTONIX) 40 MG tablet Take 1 tablet (40 mg total) by mouth 2 (two) times daily before a meal. 05/24/22 07/23/22  Rai, Ripudeep K, MD  polyethylene glycol (MIRALAX / GLYCOLAX) 17 g packet Take 17 g by mouth daily as needed for moderate constipation.    [provider]  prochlorperazine (COMPAZINE) 10 MG tablet Take 1 tablet (10 mg  total) by mouth every 6 (six) hours as needed for nausea or vomiting. 05/24/22   Rai, [name], MD  promethazine (PHENERGAN) 25 MG suppository Unwrap and place 1 suppository (25 mg total) rectally every 6 (six) hours as needed for nausea or vomiting. 07/17/22   Domenic Polite, MD  rosuvastatin (CRESTOR) 20 MG tablet Take 1 tablet (20 mg total) by mouth daily. 05/24/22 09/21/22  Rai, [name], MD  scopolamine (TRANSDERM-SCOP) 1 MG/3DAYS Place 1 patch (1.5 mg total) onto the skin every 3 (three) days. 05/26/22    Rai, Ripudeep K, MD  tobramycin (TOBREX) 0.3 % ophthalmic solution Place 1-2 drops into both eyes See admin instructions. Instill 1-2 drops into both eyes 3 times daily the day before, day of, and the day after every 6 weeks eye injections per patient 11/17/20   [provider]  torsemide (DEMADEX) 20 MG tablet Take 3 tablets (60 mg total) by mouth daily. 08/14/22   Clegg, Amy D, NP      Allergies    Atorvastatin, Dulaglutide, Zofran [ondansetron], Amoxicillin, Lantus [insulin glargine], and Miconazole nitrate    Review of Systems   Review of Systems  Gastrointestinal:  Positive for abdominal pain, nausea and vomiting.  All other systems reviewed and are negative.   Physical Exam Updated Vital Signs BP (!) 165/120   Pulse (!) 108   Temp 99 F (37.2 C) (Oral)   Resp 16   Ht 6' (1.829 m)   Wt 79.4 kg   LMP 11/17/2021 (Approximate)   SpO2 100%   BMI 23.73 kg/m   Physical Exam Vitals and nursing note reviewed.  Constitutional:      Appearance: She is well-developed.     Comments: Repeated coughing/spitting into bag but no true emesis observed  HENT:     Head: Normocephalic and atraumatic.  Eyes:     Conjunctiva/sclera: Conjunctivae normal.     Pupils: Pupils are equal, round, and reactive to light.  Cardiovascular:     Rate and Rhythm: Normal rate and regular rhythm.     Heart sounds: Normal heart sounds.  Pulmonary:     Effort: Pulmonary effort is normal.     Breath sounds: Normal breath sounds.  Abdominal:     General: Bowel sounds are normal.     Palpations: Abdomen is soft.     Tenderness: There is no abdominal tenderness. There is no rebound.  Musculoskeletal:        General: Normal range of motion.     Cervical back: Normal range of motion.  Skin:    General: Skin is warm and dry.  Neurological:     Mental Status: She is alert and oriented to person, place, and time.     ED Results / Procedures / Treatments   Labs (all labs ordered are listed,  but only abnormal results are displayed) Labs Reviewed  COMPREHENSIVE METABOLIC PANEL - Abnormal; Notable for the following components:      Result Value   CO2 21 (*)    Glucose, Bld 149 (*)    BUN 37 (*)    Creatinine, Ser 3.45 (*)    Total Bilirubin 1.4 (*)    GFR, Estimated 15 (*)    All other components within normal limits  CBC - Abnormal; Notable for the following components:   Hemoglobin 11.5 (*)    HCT 35.6 (*)    All other components within normal limits  URINALYSIS, ROUTINE W REFLEX MICROSCOPIC - Abnormal; Notable for the following components:  APPearance HAZY (*)    Glucose, UA 150 (*)    Ketones, ur 5 (*)    Protein, ur >=300 (*)    Bacteria, UA RARE (*)    All other components within normal limits  BETA-HYDROXYBUTYRIC ACID - Abnormal; Notable for the following components:   Beta-Hydroxybutyric Acid 1.03 (*)    All other components within normal limits  BLOOD GAS, VENOUS - Abnormal; Notable for the following components:   pH, Ven 7.49 (*)    pCO2, Ven 33 (*)    Acid-Base Excess 2.2 (*)    All other components within normal limits  CBG MONITORING, ED - Abnormal; Notable for the following components:   Glucose-Capillary 141 (*)    All other components within normal limits  LIPASE, BLOOD    EKG None  Radiology No results found.  Procedures Procedures    Medications Ordered in ED Medications  promethazine (PHENERGAN) injection 12.5 mg (12.5 mg Intramuscular Given 08/26/22 2140)  sodium chloride 0.9 % bolus 1,000 mL (0 mLs Intravenous Stopped 08/26/22 2340)  LORazepam (ATIVAN) injection 1 mg (1 mg Intravenous Given 08/26/22 2237)    ED Course/ Medical Decision Making/ A&P                             Medical Decision Making Amount and/or Complexity of Data Reviewed ECG/medicine tests: ordered and independent interpretation performed.  Risk Prescription drug management.   52 year old presenting to the ED with nausea and vomiting.  Feels like prior  gastroparesis.  She did have some concerns of low blood sugar so discontinued her insulin pump.  She is afebrile, nontoxic.  She is spitting into bag on my exam but is not having any true emesis.  She was already given IM Phenergan in triage without much relief.  Her labs are overall reassuring, no anion gap, not clinically concerning for DKA.  She is requesting IVF and ativan.  Will monitor.  Patient sleeping after ativan.  Tachycardia has resolved with IVF.  Remains non-toxic in appearance.  5:45 AM Patient has been sleeping in the ED for several hours.  No active emesis witnessed.  Vitals remain stable.  Patient awoken, states she feels much better after getting some rest.  She is comfortable with discharge home but requested to wait until 7am when her ride can pick her up.  This seems reasonable.  Encouraged to continue home antiemetics, follow-up with PCP.  Return here for new concerns.  Final Clinical Impression(s) / ED Diagnoses Final diagnoses:  Gastroparesis    Rx / DC Orders ED Discharge Orders     None         Larene Pickett, PA-C 08/27/22 0076    Davonna Belling, MD 08/29/22 [phone]

## 2022-08-26 NOTE — ED Provider Triage Note (Signed)
Emergency Medicine Provider Triage Evaluation Note  [name] , a 52 y.o. female  was evaluated in triage.  Patient has a history of type 1 diabetes and gastroparesis presenting today with 3 to 4 days worth of nausea, vomiting and abdominal pain.  Feels like her previous bouts of gastroparesis.  Says that her blood sugars have been running very low so she discontinued her insulin pump.  Review of Systems  Positive:  Negative:   Physical Exam  LMP 11/17/2021 (Approximate)  Gen:   Awake, no distress   Resp:  Normal effort  MSK:   Moves extremities without difficulty  Other:  Generalized abdominal tenderness.  Actively vomiting in triage  Medical Decision Making  Medically screening exam initiated at 8:42 PM.  Appropriate orders placed.  [name] was informed that the remainder of the evaluation will be completed by another provider, this initial triage assessment does not replace that evaluation, and the importance of remaining in the ED until their evaluation is complete.  EMS said that they tried to give her Zofran but they do not believe the IV was truly in her vein.  Will give IM Phenergan     Rhae Hammock, PA-C 08/26/22 2106

## 2022-08-27 ENCOUNTER — Emergency Department (HOSPITAL_COMMUNITY): Payer: BC Managed Care – PPO

## 2022-08-27 ENCOUNTER — Encounter (HOSPITAL_COMMUNITY): Payer: Self-pay | Admitting: Emergency Medicine

## 2022-08-27 ENCOUNTER — Emergency Department (HOSPITAL_COMMUNITY)
Admission: EM | Admit: 2022-08-27 | Discharge: 2022-08-27 | Disposition: A | Discharge status: Home / Self Care | Payer: BC Managed Care – PPO | Source: Home / Self Care | Attending: Emergency Medicine | Admitting: Emergency Medicine

## 2022-08-27 DIAGNOSIS — Z794 Long term (current) use of insulin: Secondary | ICD-10-CM | POA: Insufficient documentation

## 2022-08-27 DIAGNOSIS — K3184 Gastroparesis: Secondary | ICD-10-CM | POA: Insufficient documentation

## 2022-08-27 DIAGNOSIS — I509 Heart failure, unspecified: Secondary | ICD-10-CM | POA: Insufficient documentation

## 2022-08-27 DIAGNOSIS — R1110 Vomiting, unspecified: Secondary | ICD-10-CM | POA: Diagnosis not present

## 2022-08-27 DIAGNOSIS — R739 Hyperglycemia, unspecified: Secondary | ICD-10-CM | POA: Diagnosis not present

## 2022-08-27 DIAGNOSIS — E1065 Type 1 diabetes mellitus with hyperglycemia: Secondary | ICD-10-CM | POA: Insufficient documentation

## 2022-08-27 DIAGNOSIS — E1143 Type 2 diabetes mellitus with diabetic autonomic (poly)neuropathy: Secondary | ICD-10-CM | POA: Diagnosis not present

## 2022-08-27 DIAGNOSIS — N189 Chronic kidney disease, unspecified: Secondary | ICD-10-CM | POA: Insufficient documentation

## 2022-08-27 DIAGNOSIS — R0902 Hypoxemia: Secondary | ICD-10-CM | POA: Diagnosis not present

## 2022-08-27 DIAGNOSIS — R000 Tachycardia, unspecified: Secondary | ICD-10-CM | POA: Insufficient documentation

## 2022-08-27 DIAGNOSIS — R1084 Generalized abdominal pain: Secondary | ICD-10-CM | POA: Diagnosis not present

## 2022-08-27 DIAGNOSIS — K920 Hematemesis: Secondary | ICD-10-CM | POA: Diagnosis not present

## 2022-08-27 DIAGNOSIS — E1022 Type 1 diabetes mellitus with diabetic chronic kidney disease: Secondary | ICD-10-CM | POA: Insufficient documentation

## 2022-08-27 LAB — TYPE AND SCREEN
ABO/RH(D): A POS
Antibody Screen: NEGATIVE

## 2022-08-27 LAB — I-STAT VENOUS BLOOD GAS, ED
Acid-Base Excess: 0 mmol/L (ref 0.0–2.0)
Bicarbonate: 22.9 mmol/L (ref 20.0–28.0)
Calcium, Ion: 1.08 mmol/L — ABNORMAL LOW (ref 1.15–1.40)
HCT: 33 % — ABNORMAL LOW (ref 36.0–46.0)
Hemoglobin: 11.2 g/dL — ABNORMAL LOW (ref 12.0–15.0)
O2 Saturation: 88 %
Potassium: 3.8 mmol/L (ref 3.5–5.1)
Sodium: 141 mmol/L (ref 135–145)
TCO2: 24 mmol/L (ref 22–32)
pCO2, Ven: 31.8 mmHg — ABNORMAL LOW (ref 44–60)
pH, Ven: 7.467 — ABNORMAL HIGH (ref 7.25–7.43)
pO2, Ven: 51 mmHg — ABNORMAL HIGH (ref 32–45)

## 2022-08-27 LAB — BETA-HYDROXYBUTYRIC ACID: Beta-Hydroxybutyric Acid: 0.53 mmol/L — ABNORMAL HIGH (ref 0.05–0.27)

## 2022-08-27 LAB — COMPREHENSIVE METABOLIC PANEL
ALT: 13 U/L (ref 0–44)
AST: 15 U/L (ref 15–41)
Albumin: 3.2 g/dL — ABNORMAL LOW (ref 3.5–5.0)
Alkaline Phosphatase: 97 U/L (ref 38–126)
Anion gap: 11 (ref 5–15)
BUN: 33 mg/dL — ABNORMAL HIGH (ref 6–20)
CO2: 25 mmol/L (ref 22–32)
Calcium: 9.1 mg/dL (ref 8.9–10.3)
Chloride: 105 mmol/L (ref 98–111)
Creatinine, Ser: 3.49 mg/dL — ABNORMAL HIGH (ref 0.44–1.00)
GFR, Estimated: 15 mL/min — ABNORMAL LOW (ref >60)
Glucose, Bld: 353 mg/dL — ABNORMAL HIGH (ref 70–99)
Potassium: 3.8 mmol/L (ref 3.5–5.1)
Sodium: 141 mmol/L (ref 135–145)
Total Bilirubin: 1.4 mg/dL — ABNORMAL HIGH (ref 0.3–1.2)
Total Protein: 6.9 g/dL (ref 6.5–8.1)

## 2022-08-27 LAB — CBC WITH DIFFERENTIAL/PLATELET
Abs Immature Granulocytes: 0.02 10*3/uL (ref 0.00–0.07)
Basophils Absolute: 0 10*3/uL (ref 0.0–0.1)
Basophils Relative: 0 %
Eosinophils Absolute: 0 10*3/uL (ref 0.0–0.5)
Eosinophils Relative: 0 %
HCT: 34.4 % — ABNORMAL LOW (ref 36.0–46.0)
Hemoglobin: 11.2 g/dL — ABNORMAL LOW (ref 12.0–15.0)
Immature Granulocytes: 0 %
Lymphocytes Relative: 12 %
Lymphs Abs: 1.1 10*3/uL (ref 0.7–4.0)
MCH: 28.5 pg (ref 26.0–34.0)
MCHC: 32.6 g/dL (ref 30.0–36.0)
MCV: 87.5 fL (ref 80.0–100.0)
Monocytes Absolute: 0.2 10*3/uL (ref 0.1–1.0)
Monocytes Relative: 2 %
Neutro Abs: 7.3 10*3/uL (ref 1.7–7.7)
Neutrophils Relative %: 86 %
Platelets: 284 10*3/uL (ref 150–400)
RBC: 3.93 MIL/uL (ref 3.87–5.11)
RDW: 14.5 % (ref 11.5–15.5)
WBC: 8.7 10*3/uL (ref 4.0–10.5)
nRBC: 0 % (ref 0.0–0.2)

## 2022-08-27 LAB — TROPONIN I (HIGH SENSITIVITY)
Troponin I (High Sensitivity): 45 ng/L — ABNORMAL HIGH (ref <18)
Troponin I (High Sensitivity): 52 ng/L — ABNORMAL HIGH (ref <18)

## 2022-08-27 LAB — CBG MONITORING, ED
Glucose-Capillary: 350 mg/dL — ABNORMAL HIGH (ref 70–99)
Glucose-Capillary: 289 mg/dL — ABNORMAL HIGH (ref 70–99)

## 2022-08-27 LAB — POC OCCULT BLOOD, ED: Fecal Occult Bld: NEGATIVE

## 2022-08-27 LAB — LIPASE, BLOOD: Lipase: 26 U/L (ref 11–51)

## 2022-08-27 LAB — PROTIME-INR
INR: 1 (ref 0.8–1.2)
Prothrombin Time: 12.8 seconds (ref 11.4–15.2)

## 2022-08-27 LAB — LACTIC ACID, PLASMA
Lactic Acid, Venous: 1.9 mmol/L (ref 0.5–1.9)
Lactic Acid, Venous: 1.6 mmol/L (ref 0.5–1.9)

## 2022-08-27 MED ORDER — HYDRALAZINE HCL 25 MG PO TABS
50.0000 mg | ORAL_TABLET | Freq: Once | ORAL | Status: AC
  Administered 2022-08-27: 50 mg via ORAL
  Filled 2022-08-27: qty 2

## 2022-08-27 MED ORDER — METOCLOPRAMIDE HCL 5 MG/ML IJ SOLN
10.0000 mg | Freq: Once | INTRAMUSCULAR | Status: AC
  Administered 2022-08-27: 10 mg via INTRAVENOUS
  Filled 2022-08-27: qty 2

## 2022-08-27 MED ORDER — INSULIN ASPART 100 UNIT/ML IJ SOLN
5.0000 [IU] | Freq: Once | INTRAMUSCULAR | Status: AC
  Administered 2022-08-27: 5 [IU] via SUBCUTANEOUS

## 2022-08-27 MED ORDER — LACTATED RINGERS IV BOLUS
1000.0000 mL | Freq: Once | INTRAVENOUS | Status: AC
  Administered 2022-08-27: 1000 mL via INTRAVENOUS

## 2022-08-27 MED ORDER — LORAZEPAM 2 MG/ML IJ SOLN
1.0000 mg | Freq: Once | INTRAMUSCULAR | Status: AC
  Administered 2022-08-27: 1 mg via INTRAVENOUS
  Filled 2022-08-27: qty 1

## 2022-08-27 MED ORDER — METOPROLOL SUCCINATE ER 25 MG PO TB24
25.0000 mg | ORAL_TABLET | Freq: Every day | ORAL | Status: DC
  Administered 2022-08-27: 25 mg via ORAL
  Filled 2022-08-27: qty 1

## 2022-08-27 MED ORDER — PANTOPRAZOLE SODIUM 40 MG IV SOLR
40.0000 mg | Freq: Once | INTRAVENOUS | Status: AC
  Administered 2022-08-27: 40 mg via INTRAVENOUS
  Filled 2022-08-27: qty 10

## 2022-08-27 NOTE — ED Provider Notes (Signed)
[place] Provider Note   CSN: [id number] Arrival date & time: 08/27/22  1335     History  No chief complaint on file.   [name] is a 52 y.o. female.  Patient with type 1 diabetes, gastroparesis, CHF, CKD presenting with nausea, vomiting abdominal pain.  She reports blood streaks in her emesis.  She was discharged from the ED this morning for the same.  States she has not been to keep anything down at home and believes she is having a gastroparesis exacerbation.  States she has been vomiting about 30 times (Seen some red streaks on her blood.  Emesis is clear and red and orange.  Denies any diarrhea.  Denies any fever, chills, chest pain or shortness of breath.  Does admit to diffuse abdominal pain similar to previous episodes of gastroparesis. Reports her insulin pump was removed at her earlier ED visit and she is hyperglycemic.        Home Medications Prior to Admission medications   Medication Sig Start Date End Date Taking? Authorizing Provider  acetaminophen (TYLENOL) 500 MG tablet Take 1,000 mg by mouth 3 (three) times daily as needed for moderate pain.    [provider]  albuterol (PROAIR HFA) 108 (90 Base) MCG/ACT inhaler Inhale 2 puffs into the lungs every 6 (six) hours as needed for wheezing or shortness of breath. 07/03/20   [name], PA-C  Carboxymethylcellul-Glycerin (LUBRICATING EYE DROPS OP) Place 1-2 drops into both eyes daily as needed (dry eyes).    [provider]  Continuous Blood Gluc Sensor (FREESTYLE LIBRE 2 SENSOR) MISC Inject 1 Device into the skin every 14 (fourteen) days. 03/20/20   [provider]  Ensure Max Protein (ENSURE MAX PROTEIN) LIQD Take 330 mLs (11 oz total) by mouth daily. 04/30/22   Hongalgi, [name], MD  hydrALAZINE (APRESOLINE) 50 MG tablet Take 1 tablet (50 mg total) by mouth 3 (three) times daily. 07/17/22   Domenic Polite, MD  hydrOXYzine (ATARAX) 50  MG tablet Take 50 mg by mouth in the morning, at noon, and at bedtime.    [provider]  insulin aspart (NOVOLOG) 100 UNIT/ML injection Inject into the skin See admin instructions. Patient uses this with the [place].  Units vary according to blood sugar.    [provider]  LORazepam (ATIVAN) 0.5 MG tablet Take 1 tablet (0.5 mg total) by mouth every 8 (eight) hours as needed for anxiety (Refractory nausea and vomiting). 07/17/22   Domenic Polite, MD  metoCLOPramide (REGLAN) 10 MG tablet Take 1 tablet (10 mg total) by mouth 3 (three) times daily before meals. 07/17/22   Domenic Polite, MD  metoprolol succinate (TOPROL XL) 25 MG 24 hr tablet Take 1 tablet (25 mg total) by mouth daily. 04/19/22   [name], PA-C  Multiple Vitamin (MULTIVITAMIN WITH MINERALS) TABS tablet Take 1 tablet by mouth daily. 04/30/22   Hongalgi, [name], MD  pantoprazole (PROTONIX) 40 MG tablet Take 1 tablet (40 mg total) by mouth 2 (two) times daily before a meal. 05/24/22 07/23/22  Rai, Ripudeep K, MD  polyethylene glycol (MIRALAX / GLYCOLAX) 17 g packet Take 17 g by mouth daily as needed for moderate constipation.    [provider]  prochlorperazine (COMPAZINE) 10 MG tablet Take 1 tablet (10 mg total) by mouth every 6 (six) hours as needed for nausea or vomiting. 05/24/22   Rai, [name], MD  promethazine (PHENERGAN) 25 MG suppository Unwrap  and place 1 suppository (25 mg total) rectally every 6 (six) hours as needed for nausea or vomiting. 07/17/22   Domenic Polite, MD  rosuvastatin (CRESTOR) 20 MG tablet Take 1 tablet (20 mg total) by mouth daily. 05/24/22 09/21/22  Rai, [name], MD  scopolamine (TRANSDERM-SCOP) 1 MG/3DAYS Place 1 patch (1.5 mg total) onto the skin every 3 (three) days. 05/26/22   Rai, Ripudeep K, MD  tobramycin (TOBREX) 0.3 % ophthalmic solution Place 1-2 drops into both eyes See admin instructions. Instill 1-2 drops into both eyes 3 times daily the day before,  day of, and the day after every 6 weeks eye injections per patient 11/17/20   [provider]  torsemide (DEMADEX) 20 MG tablet Take 3 tablets (60 mg total) by mouth daily. 08/14/22   Clegg, Amy D, NP      Allergies    Atorvastatin, Dulaglutide, Zofran [ondansetron], Amoxicillin, Lantus [insulin glargine], and Miconazole nitrate    Review of Systems   Review of Systems  Constitutional:  Positive for activity change, appetite change and fatigue.  HENT:  Negative for congestion.   Respiratory:  Negative for cough and shortness of breath.   Cardiovascular:  Negative for chest pain.  Gastrointestinal:  Positive for abdominal pain, nausea and vomiting.  Genitourinary:  Negative for dysuria and hematuria.  Musculoskeletal:  Negative for arthralgias and myalgias.  Skin:  Negative for rash.  Neurological:  Positive for weakness. Negative for dizziness and headaches.   all other systems are negative except as noted in the HPI and PMH.    Physical Exam Updated Vital Signs BP (!) 157/99   Pulse (!) 104   Resp (!) 22   Ht 6' (1.829 m)   Wt 79.4 kg   LMP 11/17/2021 (Approximate)   SpO2 100%   BMI 23.73 kg/m  Physical Exam Vitals and nursing note reviewed.  Constitutional:      General: She is not in acute distress.    Appearance: She is well-developed. She is ill-appearing.     Comments: Dry heaving Chronically ill-appearing  HENT:     Head: Normocephalic and atraumatic.     Mouth/Throat:     Pharynx: No oropharyngeal exudate.  Eyes:     Conjunctiva/sclera: Conjunctivae normal.     Pupils: Pupils are equal, round, and reactive to light.  Neck:     Comments: No meningismus. Cardiovascular:     Rate and Rhythm: Regular rhythm. Tachycardia present.     Heart sounds: Normal heart sounds. No murmur heard. Pulmonary:     Effort: Pulmonary effort is normal. No respiratory distress.     Breath sounds: Normal breath sounds.  Chest:     Chest wall: No tenderness.  Abdominal:      Palpations: Abdomen is soft.     Tenderness: There is abdominal tenderness. There is no guarding or rebound.     Comments: Mild diffuse tenderness  Musculoskeletal:        General: No tenderness. Normal range of motion.     Cervical back: Normal range of motion and neck supple.  Skin:    General: Skin is warm.  Neurological:     General: No focal deficit present.     Mental Status: She is alert and oriented to person, place, and time. Mental status is at baseline.     Cranial Nerves: No cranial nerve deficit.     Motor: No abnormal muscle tone.     Coordination: Coordination normal.     Comments:  5/5  strength throughout. CN 2-12 intact.Equal grip strength.   Psychiatric:        Behavior: Behavior normal.     ED Results / Procedures / Treatments   Labs (all labs ordered are listed, but only abnormal results are displayed) Labs Reviewed  CBC WITH DIFFERENTIAL/PLATELET - Abnormal; Notable for the following components:      Result Value   Hemoglobin 11.2 (*)    HCT 34.4 (*)    All other components within normal limits  COMPREHENSIVE METABOLIC PANEL - Abnormal; Notable for the following components:   Glucose, Bld 353 (*)    BUN 33 (*)    Creatinine, Ser 3.49 (*)    Albumin 3.2 (*)    Total Bilirubin 1.4 (*)    GFR, Estimated 15 (*)    All other components within normal limits  BETA-HYDROXYBUTYRIC ACID - Abnormal; Notable for the following components:   Beta-Hydroxybutyric Acid 0.53 (*)    All other components within normal limits  I-STAT VENOUS BLOOD GAS, ED - Abnormal; Notable for the following components:   pH, Ven 7.467 (*)    pCO2, Ven 31.8 (*)    pO2, Ven 51 (*)    Calcium, Ion 1.08 (*)    HCT 33.0 (*)    Hemoglobin 11.2 (*)    All other components within normal limits  CBG MONITORING, ED - Abnormal; Notable for the following components:   Glucose-Capillary 350 (*)    All other components within normal limits  TROPONIN I (HIGH SENSITIVITY) - Abnormal; Notable  for the following components:   Troponin I (High Sensitivity) 45 (*)    All other components within normal limits  LIPASE, BLOOD  PROTIME-INR  LACTIC ACID, PLASMA  LACTIC ACID, PLASMA  POC OCCULT BLOOD, ED  TYPE AND SCREEN  TROPONIN I (HIGH SENSITIVITY)    EKG EKG Interpretation  Date/Time:  Tuesday August 27 2022 14:06:41 EST Ventricular Rate:  111 PR Interval:  149 QRS Duration: 92 QT Interval:  318 QTC Calculation: 433 R Axis:   81 Text Interpretation: Sinus tachycardia Left atrial enlargement Nonspecific T abnormalities, lateral leads No significant change was found Confirmed by Ezequiel Essex [phone]) on 08/27/2022 2:12:19 PM  Radiology DG Chest Portable 1 View  Result Date: 08/27/2022 CLINICAL DATA:  Vomiting. EXAM: PORTABLE CHEST 1 VIEW COMPARISON:  07/22/2022. FINDINGS: 1444 hours. Left chest AICD in unchanged position with leads projecting over the right ventricle. Low lung volumes accentuate the pulmonary vasculature and cardiomediastinal silhouette. No focal airspace opacity. Mild peribronchial thickening. Stable cardiomegaly and mediastinal contours. No pleural effusion or pneumothorax. IMPRESSION: Low lung volumes with mild peribronchial thickening. No focal airspace opacity. Electronically Signed   By: Emmit Alexanders M.D.   On: 08/27/2022 15:02   DG Abd Portable 2 Views  Result Date: 08/27/2022 CLINICAL DATA:  Vomiting. EXAM: PORTABLE ABDOMEN - 2 VIEW COMPARISON:  08/23/2022 FINDINGS: Artifact overlies the abdomen. There is no evidence of ileus, obstruction or visible free air. Relative paucity of bowel contents is noted. No abnormal calcifications or significant bone findings. IMPRESSION: No acute finding. Relative paucity of bowel contents. Electronically Signed   By: Nelson Chimes M.D.   On: 08/27/2022 15:00    Procedures Procedures    Medications Ordered in ED Medications  lactated ringers bolus 1,000 mL (has no administration in time range)  LORazepam  (ATIVAN) injection 1 mg (has no administration in time range)  pantoprazole (PROTONIX) injection 40 mg (has no administration in time range)  metoCLOPramide (REGLAN) injection 10 mg (  has no administration in time range)    ED Course/ Medical Decision Making/ A&P                             Medical Decision Making Amount and/or Complexity of Data Reviewed Independent Historian: EMS Labs: ordered. Decision-making details documented in ED Course. Radiology: ordered and independent interpretation performed. Decision-making details documented in ED Course. ECG/medicine tests: ordered and independent interpretation performed. Decision-making details documented in ED Course.  Risk Prescription drug management.   Patient returns with upper abdominal pain, nausea, vomiting, hyperglycemia, and with emesis.  Concern for possible gastroparesis exacerbation.  Patient did have EGD in October 2023 that showed gastritis without evidence of ulcers or varices.  Labs show hyperglycemia without DKA.  Abdomen is soft without peritoneal signs.  Patient given IV fluids and antiemetics. Heart rate and blood pressure elevated but she has not been able to keep down her medications today.  Discussed with patient that she may need admission for symptom control.  She would prefer to go home as she has appointment tomorrow for a evaluation for dialysis access  Patient feeling improved and states she wants to try to go home.  She will give additional IV fluids as well as her home blood pressure medications as she is tachycardic and hypertensive administer medications today.  Abdomen soft without peritoneal signs.  Hemoglobin is stable with negative Hemoccult.  Reassuring EGD was recent.  Low suspicion for significant GI bleed but could consider Mallory-Weiss tear.  Patient declines admission to the hospital and would like to try to go home.  Will await fluid challenge at shift change as well as provide home  medications and some additional IV fluid.  Care be transferred to Dr. Tyrone Nine        Final Clinical Impression(s) / ED Diagnoses Final diagnoses:  Gastroparesis    Rx / DC Orders ED Discharge Orders     None         Zohar Laing, Annie Main, MD 08/27/22 [phone]

## 2022-08-27 NOTE — ED Notes (Signed)
Patient does not have insulin pump on and active. Patient has not had the pump running since last night.

## 2022-08-27 NOTE — Discharge Instructions (Addendum)
Continue your anti-emetics.  Gentle fluids, advance diet as tolerated. Follow-up with your primary care doctor. Return here for new concerns.

## 2022-08-27 NOTE — ED Provider Notes (Signed)
Received patient in turnover from Dr. Wyvonnia Dusky.  Please see their note for further details of Hx, PE.  Briefly patient is a 52 y.o. female with a No chief complaint on file. .  52 year old female with gastroparesis with ongoing symptoms.  Initially thought she needed to be readmitted.  Patient declining would like to go home.  Plan to reassess post home medications oral trial and second troponin and lactate.  Delta negative.  Lactate negative.  Tolerating by mouth.  Discharged home.    Deno Etienne, DO 08/27/22 1930

## 2022-08-27 NOTE — ED Triage Notes (Addendum)
Pt BIB [facility] coming from home. Pt presents with abd pain, N/V. Pt was d/c from the ED at 7:30 this AM and once she got home the N/V returned. EMS admin $RemoveB'4mg'[id number]$  of ondansetron and 100 mL of NaCl.   EMS Vitals   190/120 HR 110 RR 24 SpO2 99% on R/A CBG 485

## 2022-08-27 NOTE — Discharge Instructions (Addendum)
Please return for inability eat or drink or symptoms otherwise worsen.  Follow-up with your family doctor in the office.

## 2022-08-28 ENCOUNTER — Encounter: Payer: Self-pay | Admitting: Vascular Surgery

## 2022-08-28 ENCOUNTER — Ambulatory Visit (INDEPENDENT_AMBULATORY_CARE_PROVIDER_SITE_OTHER): Payer: BC Managed Care – PPO | Admitting: Vascular Surgery

## 2022-08-28 VITALS — BP 166/103 | HR 103 | Temp 97.9°F | Ht 72.0 in | Wt 177.4 lb

## 2022-08-28 DIAGNOSIS — N184 Chronic kidney disease, stage 4 (severe): Secondary | ICD-10-CM

## 2022-08-28 NOTE — Progress Notes (Signed)
V[facility] and Vein Specialist of [place]  Patient name: [name] MRN: [id number] DOB: 07/19/1971 Sex: female  REASON FOR CONSULT: Discuss options for hemodialysis  HPI: [name] is a 52 y.o. female, who is here today for discussion of hemodialysis options.  She is here with her son.  She has a long history of severe gastroparesis.  She is quite miserable today.  She is having severe nausea.  She has been to the emergency department on 3 occasions in the last week related to this.  She does have chronic renal insufficiency with a creatinine of 3.5.  He is seen today for discussion of long-term dialysis options.  She is right-handed.  She does not have a pacemaker.  Past Medical History:  Diagnosis Date   Anemia    Anxiety    Arthritis    Asthma    CHF (congestive heart failure) (HCC)    Depression    Diabetic ulcer of left foot (HCC) 05/12/2013   Gastroparesis    Generalized abdominal pain    History of chicken pox    Loss of weight 12/02/2019   Migraines    Mood disorder (HCC)    anxiety   Prurigo nodularis    with diabetic dermopathy   Type 1 diabetes, uncontrolled, with neuropathy    [place]ke   Ulcers of both lower legs (HCC) 02/20/2014    Family History  Problem Relation Age of Onset   Diabetes Mother        type 2   Hypertension Mother    Thyroid disease Mother    Bipolar disorder Mother    Heart disease Mother    Calcium disorder Mother    Cancer Maternal Grandmother        Breast, stomach   Cancer Paternal Grandmother        stomach, lung (smoker)   Diabetes Paternal Grandmother    Diabetes Paternal Grandfather    Heart failure Sister    Diabetes Sister    Stroke Maternal Aunt    Cancer Maternal Uncle        prostate   CAD Maternal Aunt        stents   Cancer Maternal Aunt 72       ovarian    SOCIAL HISTORY: Social History   Socioeconomic History   Marital status: Divorced    Spouse name: Not on  file   Number of children: 2   Years of education: Not on file   Highest education level: Not on file  Occupational History   Occupation: Production designer, theatre/television/film  Tobacco Use   Smoking status: Never   Smokeless tobacco: Never  Vaping Use   Vaping Use: Never used  Substance and Sexual Activity   Alcohol use: Not Currently   Drug use: No   Sexual activity: Yes    Partners: Male    Birth control/protection: None  Other Topics Concern   Not on file  Social History Narrative   Caffeine use: daily   Occupation: cleans houses   Regular exercise: yes, active 5x/wk   Diet: good water, fruits/vegetables daily   Social Determinants of Health   Financial Resource Strain: Low Risk  (07/15/2022)   Overall Financial Resource Strain (CARDIA)    Difficulty of Paying Living Expenses: Not very hard  Food Insecurity: No Food Insecurity (07/22/2022)   Hunger Vital Sign    Worried About Running Out of Food in the Last Year: Never true    Ran Out of Food in the  Last Year: Never true  Recent Concern: Food Insecurity - Food Insecurity Present (04/23/2022)   Hunger Vital Sign    Worried About Running Out of Food in the Last Year: Sometimes true    Ran Out of Food in the Last Year: Sometimes true  Transportation Needs: No Transportation Needs (07/22/2022)   PRAPARE - Hydrologist (Medical): No    Lack of Transportation (Non-Medical): No  Physical Activity: Not on file  Stress: Not on file  Social Connections: Not on file  Intimate Partner Violence: Not At Risk (07/22/2022)   Humiliation, Afraid, Rape, and Kick questionnaire    Fear of Current or Ex-Partner: No    Emotionally Abused: No    Physically Abused: No    Sexually Abused: No    Allergies  Allergen Reactions   Atorvastatin Other (See Comments)    Dizziness    Dulaglutide Nausea And Vomiting and Other (See Comments)    Caused pancreatitis    Zofran [Ondansetron] Nausea And Vomiting and Other (See Comments)    Causes  nausea vomiting to worsen / doesn't really work for patient.   Amoxicillin Itching and Rash   Lantus [Insulin Glargine] Itching and Rash   Miconazole Nitrate Nausea And Vomiting and Rash    Current Outpatient Medications  Medication Sig Dispense Refill   acetaminophen (TYLENOL) 500 MG tablet Take 1,000 mg by mouth 3 (three) times daily as needed for moderate pain.     albuterol (PROAIR HFA) 108 (90 Base) MCG/ACT inhaler Inhale 2 puffs into the lungs every 6 (six) hours as needed for wheezing or shortness of breath. 6.7 g 1   Carboxymethylcellul-Glycerin (LUBRICATING EYE DROPS OP) Place 1-2 drops into both eyes daily as needed (dry eyes).     Continuous Blood Gluc Sensor (FREESTYLE LIBRE 2 SENSOR) MISC Inject 1 Device into the skin every 14 (fourteen) days.     Ensure Max Protein (ENSURE MAX PROTEIN) LIQD Take 330 mLs (11 oz total) by mouth daily.     hydrALAZINE (APRESOLINE) 50 MG tablet Take 1 tablet (50 mg total) by mouth 3 (three) times daily. 90 tablet 1   hydrOXYzine (ATARAX) 50 MG tablet Take 50 mg by mouth in the morning, at noon, and at bedtime.     insulin aspart (NOVOLOG) 100 UNIT/ML injection Inject into the skin See admin instructions. Patient uses this with the [place].  Units vary according to blood sugar.     LORazepam (ATIVAN) 0.5 MG tablet Take 1 tablet (0.5 mg total) by mouth every 8 (eight) hours as needed for anxiety (Refractory nausea and vomiting). 30 tablet 0   metoCLOPramide (REGLAN) 10 MG tablet Take 1 tablet (10 mg total) by mouth 3 (three) times daily before meals. 90 tablet 0   metoprolol succinate (TOPROL XL) 25 MG 24 hr tablet Take 1 tablet (25 mg total) by mouth daily. 90 tablet 3   Multiple Vitamin (MULTIVITAMIN WITH MINERALS) TABS tablet Take 1 tablet by mouth daily.     polyethylene glycol (MIRALAX / GLYCOLAX) 17 g packet Take 17 g by mouth daily as needed for moderate constipation.     prochlorperazine (COMPAZINE) 10 MG tablet Take 1 tablet (10 mg total) by  mouth every 6 (six) hours as needed for nausea or vomiting. 30 tablet 1   promethazine (PHENERGAN) 25 MG suppository Unwrap and place 1 suppository (25 mg total) rectally every 6 (six) hours as needed for nausea or vomiting. 30 each 2   rosuvastatin (CRESTOR) 20  MG tablet Take 1 tablet (20 mg total) by mouth daily. 30 tablet 3   scopolamine (TRANSDERM-SCOP) 1 MG/3DAYS Place 1 patch (1.5 mg total) onto the skin every 3 (three) days. 24 patch 4   tobramycin (TOBREX) 0.3 % ophthalmic solution Place 1-2 drops into both eyes See admin instructions. Instill 1-2 drops into both eyes 3 times daily the day before, day of, and the day after every 6 weeks eye injections per patient     torsemide (DEMADEX) 20 MG tablet Take 3 tablets (60 mg total) by mouth daily. 90 tablet 1   pantoprazole (PROTONIX) 40 MG tablet Take 1 tablet (40 mg total) by mouth 2 (two) times daily before a meal. 60 tablet 1   No current facility-administered medications for this visit.    REVIEW OF SYSTEMS:  $RemoveB'[X]'[id number]$  denotes positive finding, $RemoveBeforeDEI'[ ]'[id number]$  denotes negative finding Cardiac  Comments:  Chest pain or chest pressure:    Shortness of breath upon exertion:    Short of breath when lying flat:    Irregular heart rhythm:        Vascular    Pain in calf, thigh, or hip brought on by ambulation:    Pain in feet at night that wakes you up from your sleep:     Blood clot in your veins:    Leg swelling:         Pulmonary    Oxygen at home:    Productive cough:     Wheezing:         Neurologic    Sudden weakness in arms or legs:     Sudden numbness in arms or legs:     Sudden onset of difficulty speaking or slurred speech:    Temporary loss of vision in one eye:     Problems with dizziness:         Gastrointestinal    Blood in stool:     Vomited blood:         Genitourinary    Burning when urinating:     Blood in urine:        Psychiatric    Major depression:         Hematologic    Bleeding problems:    Problems with  blood clotting too easily:        Skin    Rashes or ulcers:        Constitutional    Fever or chills:      PHYSICAL EXAM: Vitals:   08/28/22 1018  BP: (!) 166/103  Pulse: (!) 103  Temp: 97.9 F (36.6 C)  Weight: 177 lb 6.4 oz (80.5 kg)  Height: 6' (1.829 m)    GENERAL: The patient is a well-nourished female, in no acute distress. The vital signs are documented above. CARDIOVASCULAR: 2+ radial pulses bilaterally.  Extremely small surface veins bilaterally. PULMONARY: There is good air exchange  MUSCULOSKELETAL: There are no major deformities or cyanosis. NEUROLOGIC: No focal weakness or paresthesias are detected. SKIN: There are no ulcers or rashes noted. PSYCHIATRIC: The patient has a normal affect.  DATA:  I imaged her arm veins with SonoSite ultrasound.  She has extremely small cephalic and basilic veins.  MEDICAL ISSUES: I discussed options for hemodialysis with the patient.  I discussed tunneled catheter for acute dialysis.  Also discussed AV graft and AV fistula for long-term dialysis.  She clearly is not a fistula candidate due to the extremely small surface veins.  I did discuss the procedure  of left arm AV graft placement which I would recommend is her first access.  Explained that she would have an incision at the antecubital space in the axilla with a upper arm graft.  I discussed limitations with clotting and the maintenance.  I would recommend deferring this until she is very close to the need for dialysis.  Explained that she would need 1 month of healing of the upper arm graft prior to initiation of dialysis.  Will defer until felt appropriate by nephrology   Rosetta Posner, MD [facility] V[facility] and Vein Specialists of [facility] [phone] Pager [phone]  Note: Portions of this report may have been transcribed using voice recognition software.  Every effort has been made to ensure accuracy; however, inadvertent computerized transcription errors  may still be present.

## 2022-08-28 NOTE — Progress Notes (Deleted)
Electrophysiology Office Note Date: 08/28/2022  ID:  [name], DOB 28-Dec-1970, MRN [id number]  PCP: [name], PA Primary Cardiologist: Sinclair Grooms, MD Electrophysiologist: Melida Quitter, MD (Case was proctored by Dr. Curt Bears)  CC: Routine ICD follow-up  [name] is a 52 y.o. female seen today for Melida Quitter, MD for routine electrophysiology followup.   Multiple ER visits in past several weeks for her gastroparesis.   Since last being seen in our clinic the patient reports doing ***.  she denies chest pain, palpitations, dyspnea, PND, orthopnea, nausea, vomiting, dizziness, syncope, edema, weight gain, or early satiety.   {He/she (caps):30048} has not had ICD shocks.   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  Past Medical History:  Diagnosis Date   Anemia    Anxiety    Arthritis    Asthma    CHF (congestive heart failure) (HCC)    Depression    Diabetic ulcer of left foot ([place]) 05/12/2013   Gastroparesis    Generalized abdominal pain    History of chicken pox    Loss of weight 12/02/2019   Migraines    Mood disorder (HCC)    anxiety   Prurigo nodularis    with diabetic dermopathy   Type 1 diabetes, uncontrolled, with neuropathy    [place]   Ulcers of both lower legs (HCC) 02/20/2014    Current Outpatient Medications  Medication Instructions   acetaminophen (TYLENOL) 1,000 mg, Oral, 3 times daily PRN   albuterol (PROAIR HFA) 108 (90 Base) MCG/ACT inhaler 2 puffs, Inhalation, Every 6 hours PRN   Carboxymethylcellul-Glycerin (LUBRICATING EYE DROPS OP) 1-2 drops, Both Eyes, Daily PRN   Continuous Blood Gluc Sensor (FREESTYLE LIBRE 2 SENSOR) MISC 1 Device, Subcutaneous, Every 14 days   Ensure Max Protein (ENSURE MAX PROTEIN) LIQD 11 oz, Oral, Daily   hydrALAZINE (APRESOLINE) 50 mg, Oral, 3 times daily   hydrOXYzine (ATARAX) 50 mg, Oral, 3 times daily   insulin aspart (NOVOLOG)  100 UNIT/ML injection Subcutaneous, See admin instructions, Patient uses this with the Omnipod.  Units vary according to blood sugar.   LORazepam (ATIVAN) 0.5 mg, Oral, Every 8 hours PRN   metoCLOPramide (REGLAN) 10 mg, Oral, 3 times daily before meals   metoprolol succinate (TOPROL XL) 25 mg, Oral, Daily   Multiple Vitamin (MULTIVITAMIN WITH MINERALS) TABS tablet 1 tablet, Oral, Daily   pantoprazole (PROTONIX) 40 mg, Oral, 2 times daily before meals   polyethylene glycol (MIRALAX / GLYCOLAX) 17 g, Oral, Daily PRN   prochlorperazine (COMPAZINE) 10 mg, Oral, Every 6 hours PRN   promethazine (PHENERGAN) 25 MG suppository Unwrap and place 1 suppository (25 mg total) rectally every 6 (six) hours as needed for nausea or vomiting.   rosuvastatin (CRESTOR) 20 mg, Oral, Daily   scopolamine (TRANSDERM-SCOP) 1.5 mg, Transdermal, every 72 hours   tobramycin (TOBREX) 0.3 % ophthalmic solution 1-2 drops, Both Eyes, See admin instructions, Instill 1-2 drops into both eyes 3 times daily the day before, day of, and the day after every 6 weeks eye injections per patient    torsemide (DEMADEX) 60 mg, Oral, Daily    Family History: Family History  Problem Relation Age of Onset   Diabetes Mother        type 2   Hypertension Mother    Thyroid disease Mother    Bipolar disorder Mother    Heart disease Mother    Calcium disorder Mother  Cancer Maternal Grandmother        Breast, stomach   Cancer Paternal Grandmother        stomach, lung (smoker)   Diabetes Paternal Grandmother    Diabetes Paternal Grandfather    Heart failure Sister    Diabetes Sister    Stroke Maternal Aunt    Cancer Maternal Uncle        prostate   CAD Maternal Aunt        stents   Cancer Maternal Aunt 64       ovarian    Physical Exam: There were no vitals filed for this visit.   GEN- NAD. A&O x 3. Normal affect. HEENT: Normocephalic, atraumatic Lungs- CTAB, Normal effort.  Heart- {EPRHYTHM:28826} rate and rhythm. No  M/G/R.  Extremities- {EDEMA LEVEL:28147::"No"} peripheral edema. no clubbing or cyanosis Skin- warm and dry, no rash or lesion; ICD pocket well healed  ICD interrogation- reviewed in detail today,  See PACEART report  {EKGtoday:28818::"EKG is not ordered today"}  Other studies Reviewed: Additional studies/ records that were reviewed today include: Previous EP office notes.   {Select studies to display:26339}   Assessment and Plan:  1.  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 Chronic systolic CHF Echo 12/8848 previously as low as 40-45% GDMT limited by CKD IV Ca score of 0 in 01/2020 Previously failed Entresto with hypotension.   3. HTN Stable on current regimen   Current medicines are reviewed at length with the patient today.   =  Labs/ tests ordered today include: *** No orders of the defined types were placed in this encounter.    Disposition:   Follow up with {EPPROVIDERS:28135} {EPFOLLOW UP:28173}   Jacalyn Lefevre, PA-C  08/28/2022 8:32 AM  [facility] [address] [place] [address] [phone] (office) [phone] (fax)

## 2022-08-29 ENCOUNTER — Ambulatory Visit: Payer: BC Managed Care – PPO | Admitting: Student

## 2022-08-30 ENCOUNTER — Observation Stay (HOSPITAL_COMMUNITY): Payer: BC Managed Care – PPO

## 2022-08-30 ENCOUNTER — Encounter (HOSPITAL_COMMUNITY): Payer: Self-pay

## 2022-08-30 ENCOUNTER — Other Ambulatory Visit: Payer: Self-pay

## 2022-08-30 ENCOUNTER — Inpatient Hospital Stay (HOSPITAL_BASED_OUTPATIENT_CLINIC_OR_DEPARTMENT_OTHER)
Admission: EM | Admit: 2022-08-30 | Discharge: 2022-09-01 | DRG: 074 | Disposition: A | Discharge status: Home / Self Care | Payer: BC Managed Care – PPO | Attending: Internal Medicine | Admitting: Internal Medicine

## 2022-08-30 ENCOUNTER — Encounter (HOSPITAL_BASED_OUTPATIENT_CLINIC_OR_DEPARTMENT_OTHER): Payer: Self-pay | Admitting: *Deleted

## 2022-08-30 ENCOUNTER — Other Ambulatory Visit (HOSPITAL_COMMUNITY): Payer: Self-pay | Admitting: Adult Health

## 2022-08-30 DIAGNOSIS — E1069 Type 1 diabetes mellitus with other specified complication: Secondary | ICD-10-CM | POA: Diagnosis present

## 2022-08-30 DIAGNOSIS — E1022 Type 1 diabetes mellitus with diabetic chronic kidney disease: Secondary | ICD-10-CM | POA: Diagnosis present

## 2022-08-30 DIAGNOSIS — Z888 Allergy status to other drugs, medicaments and biological substances status: Secondary | ICD-10-CM | POA: Diagnosis not present

## 2022-08-30 DIAGNOSIS — E1143 Type 2 diabetes mellitus with diabetic autonomic (poly)neuropathy: Secondary | ICD-10-CM | POA: Diagnosis not present

## 2022-08-30 DIAGNOSIS — D631 Anemia in chronic kidney disease: Secondary | ICD-10-CM | POA: Diagnosis present

## 2022-08-30 DIAGNOSIS — I130 Hypertensive heart and chronic kidney disease with heart failure and stage 1 through stage 4 chronic kidney disease, or unspecified chronic kidney disease: Secondary | ICD-10-CM | POA: Diagnosis not present

## 2022-08-30 DIAGNOSIS — Z8349 Family history of other endocrine, nutritional and metabolic diseases: Secondary | ICD-10-CM | POA: Diagnosis not present

## 2022-08-30 DIAGNOSIS — Z79899 Other long term (current) drug therapy: Secondary | ICD-10-CM | POA: Diagnosis not present

## 2022-08-30 DIAGNOSIS — R079 Chest pain, unspecified: Secondary | ICD-10-CM | POA: Diagnosis not present

## 2022-08-30 DIAGNOSIS — Z8249 Family history of ischemic heart disease and other diseases of the circulatory system: Secondary | ICD-10-CM | POA: Diagnosis not present

## 2022-08-30 DIAGNOSIS — I10 Essential (primary) hypertension: Secondary | ICD-10-CM | POA: Diagnosis present

## 2022-08-30 DIAGNOSIS — R0989 Other specified symptoms and signs involving the circulatory and respiratory systems: Secondary | ICD-10-CM | POA: Diagnosis present

## 2022-08-30 DIAGNOSIS — Z833 Family history of diabetes mellitus: Secondary | ICD-10-CM | POA: Diagnosis not present

## 2022-08-30 DIAGNOSIS — Z880 Allergy status to penicillin: Secondary | ICD-10-CM

## 2022-08-30 DIAGNOSIS — J9811 Atelectasis: Secondary | ICD-10-CM | POA: Diagnosis not present

## 2022-08-30 DIAGNOSIS — I5032 Chronic diastolic (congestive) heart failure: Secondary | ICD-10-CM | POA: Diagnosis present

## 2022-08-30 DIAGNOSIS — E1043 Type 1 diabetes mellitus with diabetic autonomic (poly)neuropathy: Secondary | ICD-10-CM | POA: Diagnosis not present

## 2022-08-30 DIAGNOSIS — F32A Depression, unspecified: Secondary | ICD-10-CM | POA: Diagnosis not present

## 2022-08-30 DIAGNOSIS — N184 Chronic kidney disease, stage 4 (severe): Secondary | ICD-10-CM | POA: Diagnosis present

## 2022-08-30 DIAGNOSIS — Z823 Family history of stroke: Secondary | ICD-10-CM | POA: Diagnosis not present

## 2022-08-30 DIAGNOSIS — K3184 Gastroparesis: Secondary | ICD-10-CM | POA: Diagnosis not present

## 2022-08-30 DIAGNOSIS — N186 End stage renal disease: Secondary | ICD-10-CM | POA: Diagnosis present

## 2022-08-30 DIAGNOSIS — E1040 Type 1 diabetes mellitus with diabetic neuropathy, unspecified: Secondary | ICD-10-CM | POA: Diagnosis present

## 2022-08-30 DIAGNOSIS — Z794 Long term (current) use of insulin: Secondary | ICD-10-CM | POA: Diagnosis not present

## 2022-08-30 DIAGNOSIS — D638 Anemia in other chronic diseases classified elsewhere: Secondary | ICD-10-CM | POA: Diagnosis present

## 2022-08-30 DIAGNOSIS — J90 Pleural effusion, not elsewhere classified: Secondary | ICD-10-CM | POA: Diagnosis not present

## 2022-08-30 DIAGNOSIS — R112 Nausea with vomiting, unspecified: Secondary | ICD-10-CM | POA: Diagnosis not present

## 2022-08-30 DIAGNOSIS — J811 Chronic pulmonary edema: Secondary | ICD-10-CM | POA: Diagnosis not present

## 2022-08-30 DIAGNOSIS — I509 Heart failure, unspecified: Secondary | ICD-10-CM | POA: Diagnosis not present

## 2022-08-30 DIAGNOSIS — F419 Anxiety disorder, unspecified: Secondary | ICD-10-CM | POA: Diagnosis present

## 2022-08-30 LAB — COMPREHENSIVE METABOLIC PANEL
ALT: 6 U/L (ref 0–44)
AST: 10 U/L — ABNORMAL LOW (ref 15–41)
Albumin: 3.6 g/dL (ref 3.5–5.0)
Alkaline Phosphatase: 85 U/L (ref 38–126)
Anion gap: 12 (ref 5–15)
BUN: 26 mg/dL — ABNORMAL HIGH (ref 6–20)
CO2: 27 mmol/L (ref 22–32)
Calcium: 9.1 mg/dL (ref 8.9–10.3)
Chloride: 102 mmol/L (ref 98–111)
Creatinine, Ser: 3.49 mg/dL — ABNORMAL HIGH (ref 0.44–1.00)
GFR, Estimated: 15 mL/min — ABNORMAL LOW (ref >60)
Glucose, Bld: 287 mg/dL — ABNORMAL HIGH (ref 70–99)
Potassium: 3.8 mmol/L (ref 3.5–5.1)
Sodium: 141 mmol/L (ref 135–145)
Total Bilirubin: 1.3 mg/dL — ABNORMAL HIGH (ref 0.3–1.2)
Total Protein: 6.9 g/dL (ref 6.5–8.1)

## 2022-08-30 LAB — I-STAT VENOUS BLOOD GAS, ED
Acid-Base Excess: 5 mmol/L — ABNORMAL HIGH (ref 0.0–2.0)
Bicarbonate: 29.2 mmol/L — ABNORMAL HIGH (ref 20.0–28.0)
Calcium, Ion: 1.17 mmol/L (ref 1.15–1.40)
HCT: 31 % — ABNORMAL LOW (ref 36.0–46.0)
Hemoglobin: 10.5 g/dL — ABNORMAL LOW (ref 12.0–15.0)
O2 Saturation: 68 %
Potassium: 4.3 mmol/L (ref 3.5–5.1)
Sodium: 139 mmol/L (ref 135–145)
TCO2: 30 mmol/L (ref 22–32)
pCO2, Ven: 39.9 mmHg — ABNORMAL LOW (ref 44–60)
pH, Ven: 7.472 — ABNORMAL HIGH (ref 7.25–7.43)
pO2, Ven: 33 mmHg (ref 32–45)

## 2022-08-30 LAB — URINALYSIS, ROUTINE W REFLEX MICROSCOPIC
Bacteria, UA: NONE SEEN
Bilirubin Urine: NEGATIVE
Glucose, UA: 500 mg/dL — AB
Leukocytes,Ua: NEGATIVE
Nitrite: NEGATIVE
Protein, ur: >300 mg/dL — AB
Specific Gravity, Urine: 1.018 (ref 1.005–1.030)
pH: 7 (ref 5.0–8.0)

## 2022-08-30 LAB — LIPASE, BLOOD: Lipase: <10 U/L — ABNORMAL LOW (ref 11–51)

## 2022-08-30 LAB — CBC
HCT: 32.1 % — ABNORMAL LOW (ref 36.0–46.0)
Hemoglobin: 10.4 g/dL — ABNORMAL LOW (ref 12.0–15.0)
MCH: 27.7 pg (ref 26.0–34.0)
MCHC: 32.4 g/dL (ref 30.0–36.0)
MCV: 85.4 fL (ref 80.0–100.0)
Platelets: 269 10*3/uL (ref 150–400)
RBC: 3.76 MIL/uL — ABNORMAL LOW (ref 3.87–5.11)
RDW: 14.3 % (ref 11.5–15.5)
WBC: 7.9 10*3/uL (ref 4.0–10.5)
nRBC: 0 % (ref 0.0–0.2)

## 2022-08-30 LAB — PREGNANCY, URINE: Preg Test, Ur: NEGATIVE

## 2022-08-30 LAB — CBG MONITORING, ED
Glucose-Capillary: 250 mg/dL — ABNORMAL HIGH (ref 70–99)
Glucose-Capillary: 255 mg/dL — ABNORMAL HIGH (ref 70–99)
Glucose-Capillary: 213 mg/dL — ABNORMAL HIGH (ref 70–99)
Glucose-Capillary: 171 mg/dL — ABNORMAL HIGH (ref 70–99)

## 2022-08-30 LAB — GLUCOSE, CAPILLARY: Glucose-Capillary: 126 mg/dL — ABNORMAL HIGH (ref 70–99)

## 2022-08-30 MED ORDER — HYDRALAZINE HCL 50 MG PO TABS
50.0000 mg | ORAL_TABLET | Freq: Three times a day (TID) | ORAL | Status: DC
  Administered 2022-08-30 – 2022-09-01 (×6): 50 mg via ORAL
  Filled 2022-08-30 (×3): qty 1
  Filled 2022-08-30: qty 2
  Filled 2022-08-30 (×3): qty 1

## 2022-08-30 MED ORDER — ALBUTEROL SULFATE HFA 108 (90 BASE) MCG/ACT IN AERS
2.0000 | INHALATION_SPRAY | Freq: Four times a day (QID) | RESPIRATORY_TRACT | Status: DC | PRN
  Administered 2022-08-30: 2 via RESPIRATORY_TRACT
  Filled 2022-08-30: qty 6.7

## 2022-08-30 MED ORDER — METOPROLOL SUCCINATE ER 25 MG PO TB24
25.0000 mg | ORAL_TABLET | Freq: Every day | ORAL | Status: DC
  Administered 2022-08-30 – 2022-09-01 (×3): 25 mg via ORAL
  Filled 2022-08-30 (×3): qty 1

## 2022-08-30 MED ORDER — ALBUTEROL SULFATE (2.5 MG/3ML) 0.083% IN NEBU: 2.5000 mg | INHALATION_SOLUTION | Freq: Four times a day (QID) | RESPIRATORY_TRACT | Status: DC | PRN

## 2022-08-30 MED ORDER — PROMETHAZINE HCL 25 MG/ML IJ SOLN
INTRAMUSCULAR | Status: AC
  Filled 2022-08-30: qty 1

## 2022-08-30 MED ORDER — LORAZEPAM 2 MG/ML IJ SOLN
1.0000 mg | Freq: Once | INTRAMUSCULAR | Status: AC
  Administered 2022-08-30: 1 mg via INTRAVENOUS
  Filled 2022-08-30: qty 1

## 2022-08-30 MED ORDER — MORPHINE SULFATE (PF) 4 MG/ML IV SOLN
4.0000 mg | Freq: Once | INTRAVENOUS | Status: AC
  Administered 2022-08-30: 4 mg via INTRAVENOUS
  Filled 2022-08-30: qty 1

## 2022-08-30 MED ORDER — INSULIN ASPART 100 UNIT/ML IJ SOLN
0.0000 [IU] | INTRAMUSCULAR | Status: DC
  Administered 2022-08-30: 3 [IU] via SUBCUTANEOUS
  Administered 2022-08-30: 2 [IU] via SUBCUTANEOUS
  Administered 2022-08-31 (×2): 3 [IU] via SUBCUTANEOUS
  Administered 2022-08-31 (×2): 1 [IU] via SUBCUTANEOUS
  Administered 2022-08-31 (×2): 3 [IU] via SUBCUTANEOUS
  Administered 2022-09-01: 5 [IU] via SUBCUTANEOUS
  Administered 2022-09-01: 3 [IU] via SUBCUTANEOUS
  Administered 2022-09-01: 1 [IU] via SUBCUTANEOUS

## 2022-08-30 MED ORDER — INSULIN ASPART 100 UNIT/ML IJ SOLN: 0.0000 [IU] | Freq: Three times a day (TID) | INTRAMUSCULAR | Status: DC

## 2022-08-30 MED ORDER — SODIUM CHLORIDE 0.9 % IV BOLUS
1000.0000 mL | Freq: Once | INTRAVENOUS | Status: AC
  Administered 2022-08-30: 1000 mL via INTRAVENOUS

## 2022-08-30 MED ORDER — KETOROLAC TROMETHAMINE 15 MG/ML IJ SOLN
15.0000 mg | Freq: Once | INTRAMUSCULAR | Status: AC
  Administered 2022-08-30: 15 mg via INTRAVENOUS
  Filled 2022-08-30: qty 1

## 2022-08-30 MED ORDER — INSULIN ASPART 100 UNIT/ML IJ SOLN: 0.0000 [IU] | Freq: Every day | INTRAMUSCULAR | Status: DC

## 2022-08-30 MED ORDER — HALOPERIDOL LACTATE 5 MG/ML IJ SOLN
2.0000 mg | Freq: Once | INTRAMUSCULAR | Status: AC
  Administered 2022-08-30: 2 mg via INTRAVENOUS
  Filled 2022-08-30: qty 1

## 2022-08-30 MED ORDER — TORSEMIDE 20 MG PO TABS
20.0000 mg | ORAL_TABLET | Freq: Every day | ORAL | Status: DC
  Filled 2022-08-30: qty 1

## 2022-08-30 MED ORDER — INSULIN DETEMIR 100 UNIT/ML ~~LOC~~ SOLN
10.0000 [IU] | Freq: Every day | SUBCUTANEOUS | Status: DC
  Administered 2022-08-31 (×2): 10 [IU] via SUBCUTANEOUS
  Filled 2022-08-30 (×3): qty 0.1

## 2022-08-30 MED ORDER — SODIUM CHLORIDE 0.9 % IV SOLN
12.5000 mg | Freq: Four times a day (QID) | INTRAVENOUS | Status: DC | PRN
  Administered 2022-08-30 – 2022-08-31 (×3): 12.5 mg via INTRAVENOUS
  Filled 2022-08-30 (×2): qty 0.5
  Filled 2022-08-30: qty 12.5
  Filled 2022-08-30: qty 0.5

## 2022-08-30 MED ORDER — HYDRALAZINE HCL 20 MG/ML IJ SOLN
20.0000 mg | Freq: Once | INTRAMUSCULAR | Status: AC
  Administered 2022-08-30: 20 mg via INTRAVENOUS
  Filled 2022-08-30: qty 1

## 2022-08-30 NOTE — Inpatient Diabetes Management (Signed)
Inpatient Diabetes Program Recommendations  AACE/ADA: New Consensus Statement on Inpatient Glycemic Control (2015)  Target Ranges:  Prepandial:   less than 140 mg/dL      Peak postprandial:   less than 180 mg/dL (1-2 hours)      Critically ill patients:  140 - 180 mg/dL   Lab Results  Component Value Date   GLUCAP 255 (H) 08/30/2022   HGBA1C 6.4 (H) 07/12/2022    Review of Glycemic Control  Diabetes history: type 1 Outpatient Diabetes medications: Omnipod insulin pump with Novolog(Total basal= 24 units), Freestyle Libre 3 CGM Current orders for Inpatient glycemic control: Novolog 0-9 units correction scale TID  Inpatient Diabetes Program Recommendations:   Received diabetes coordinator consult for insulin management assistance. Our inpatient diabetes team consulted with this patient  during last admission on 07/17/22.   Per previous insulin orders during last admission,  recommend Levemir 10 units daily, Novolog 0-6 units correction scale every 4 hours since patient is not eating.  Titrate dosages as needed. When eating, may need to change insulin orders.   Will continue to monitor blood sugars while in the hospital.  Harvel Ricks RN BSN CDE Diabetes Coordinator Pager: [phone]  8am-5pm

## 2022-08-30 NOTE — Assessment & Plan Note (Signed)
A1C in 12/23: 6.4  Her insulin pump was turned off at [facility] Continue long acting levemir 10 units for basal dosing SSI and accuchecks qac/hs

## 2022-08-30 NOTE — ED Triage Notes (Signed)
Patient with one week history of n/v.  She has hx of gastroparesis.  Patient states she has been in and out of the hospital for same.  Patient reports she feels that she has had a fever as well.  She reports onset of diarrhea this morning.  Patient is a diabetic.  She has taken her insulin today.  She reports her sugar was 276 at home.  Patient is alert but tired in presentation.  Skin is pale.

## 2022-08-30 NOTE — ED Provider Notes (Signed)
[facility] Provider Note   CSN: [id number] Arrival date & time: 08/30/22  7017     History  Chief Complaint  Patient presents with   Emesis   Nausea   Abdominal Pain   Diarrhea         Shirlie Enck is a 52 y.o. female w/ hx of gastroparesis presenting to the ED with nausea/vomiting/diarrhea.  Patient is an insulin-dependent diabetic with an insulin pump, she disable 2 days ago because of her persistent vomiting.  She has been seen in the ED multiple times for similar episodes over the past week, approximately 3 prior visits, including 3 days ago.  On each of these occasions her symptoms were controlled with Ativan and fluids and Phenergan, and she opted to go home instead of medical admission.  Unfortunately she is not tolerating her home medicines.  She tried a rectal Phenergan suppository last night.  She has been having loose stools overnight.  She continues to vomit and unable to keep down her home medications or fluids.  Records show she has chronic renal insufficiency with an elevated creatinine between 3 and 4.  She states that she was diagnosed with gastroparesis and referred to [facility] for specialty treatment, but has her first appointment there in February.  EGD on Oct 2023 with Grade 3 reflux esophagitis but otherwise normal stomach.  HPI     Home Medications Prior to Admission medications   Medication Sig Start Date End Date Taking? Authorizing Provider  acetaminophen (TYLENOL) 500 MG tablet Take 1,000 mg by mouth 3 (three) times daily as needed for moderate pain.    [provider]  albuterol (PROAIR HFA) 108 (90 Base) MCG/ACT inhaler Inhale 2 puffs into the lungs every 6 (six) hours as needed for wheezing or shortness of breath. 07/03/20   [name], PA-C  Carboxymethylcellul-Glycerin (LUBRICATING EYE DROPS OP) Place 1-2 drops into both eyes daily as needed (dry eyes).    [provider]   Continuous Blood Gluc Sensor (FREESTYLE LIBRE 2 SENSOR) MISC Inject 1 Device into the skin every 14 (fourteen) days. 03/20/20   [provider]  Ensure Max Protein (ENSURE MAX PROTEIN) LIQD Take 330 mLs (11 oz total) by mouth daily. 04/30/22   Hongalgi, [name], MD  hydrALAZINE (APRESOLINE) 50 MG tablet Take 1 tablet (50 mg total) by mouth 3 (three) times daily. 07/17/22   Domenic Polite, MD  hydrOXYzine (ATARAX) 50 MG tablet Take 50 mg by mouth in the morning, at noon, and at bedtime.    [provider]  insulin aspart (NOVOLOG) 100 UNIT/ML injection Inject into the skin See admin instructions. Patient uses this with the [place].  Units vary according to blood sugar.    [provider]  LORazepam (ATIVAN) 0.5 MG tablet Take 1 tablet (0.5 mg total) by mouth every 8 (eight) hours as needed for anxiety (Refractory nausea and vomiting). 07/17/22   Domenic Polite, MD  metoCLOPramide (REGLAN) 10 MG tablet Take 1 tablet (10 mg total) by mouth 3 (three) times daily before meals. 07/17/22   Domenic Polite, MD  metoprolol succinate (TOPROL XL) 25 MG 24 hr tablet Take 1 tablet (25 mg total) by mouth daily. 04/19/22   [name], PA-C  Multiple Vitamin (MULTIVITAMIN WITH MINERALS) TABS tablet Take 1 tablet by mouth daily. 04/30/22   Hongalgi, [name], MD  pantoprazole (PROTONIX) 40 MG tablet Take 1 tablet (40 mg total) by mouth 2 (two) times daily before  a meal. 05/24/22 07/23/22  Rai, Ripudeep K, MD  polyethylene glycol (MIRALAX / GLYCOLAX) 17 g packet Take 17 g by mouth daily as needed for moderate constipation.    [provider]  prochlorperazine (COMPAZINE) 10 MG tablet Take 1 tablet (10 mg total) by mouth every 6 (six) hours as needed for nausea or vomiting. 05/24/22   Rai, Delene Ruffini, MD  promethazine (PHENERGAN) 25 MG suppository Unwrap and place 1 suppository (25 mg total) rectally every 6 (six) hours as needed for nausea or vomiting. 07/17/22   Zannie Cove, MD  rosuvastatin (CRESTOR) 20 MG tablet Take 1 tablet (20 mg total) by mouth daily. 05/24/22 09/21/22  Rai, Delene Ruffini, MD  scopolamine (TRANSDERM-SCOP) 1 MG/3DAYS Place 1 patch (1.5 mg total) onto the skin every 3 (three) days. 05/26/22   Rai, Ripudeep K, MD  tobramycin (TOBREX) 0.3 % ophthalmic solution Place 1-2 drops into both eyes See admin instructions. Instill 1-2 drops into both eyes 3 times daily the day before, day of, and the day after every 6 weeks eye injections per patient 11/17/20   [provider]  torsemide (DEMADEX) 20 MG tablet TAKE THREE TABLETS BY MOUTH DAILY 08/30/22   Clegg, Amy D, NP      Allergies    Atorvastatin, Dulaglutide, Zofran [ondansetron], Amoxicillin, Lantus [insulin glargine], and Miconazole nitrate    Review of Systems   Review of Systems  Physical Exam Updated Vital Signs BP (!) 147/89   Pulse 98   Temp 99 F (37.2 C) (Oral)   Resp 19   Ht 6' (1.829 m)   Wt 80.7 kg   LMP 11/17/2021 (Approximate)   SpO2 97%   BMI 24.14 kg/m  Physical Exam Constitutional:      General: She is not in acute distress.    Comments: Dry heaving  HENT:     Head: Normocephalic and atraumatic.  Eyes:     Conjunctiva/sclera: Conjunctivae normal.     Pupils: Pupils are equal, round, and reactive to light.  Cardiovascular:     Rate and Rhythm: Normal rate and regular rhythm.  Pulmonary:     Effort: Pulmonary effort is normal. No respiratory distress.  Abdominal:     General: There is no distension.     Tenderness: There is no abdominal tenderness.  Skin:    General: Skin is warm and dry.  Neurological:     General: No focal deficit present.     Mental Status: She is alert. Mental status is at baseline.  Psychiatric:        Mood and Affect: Mood normal.        Behavior: Behavior normal.     ED Results / Procedures / Treatments   Labs (all labs ordered are listed, but only abnormal results are displayed) Labs Reviewed  LIPASE, BLOOD -  Abnormal; Notable for the following components:      Result Value   Lipase <10 (*)    All other components within normal limits  COMPREHENSIVE METABOLIC PANEL - Abnormal; Notable for the following components:   Glucose, Bld 287 (*)    BUN 26 (*)    Creatinine, Ser 3.49 (*)    AST 10 (*)    Total Bilirubin 1.3 (*)    GFR, Estimated 15 (*)    All other components within normal limits  CBC - Abnormal; Notable for the following components:   RBC 3.76 (*)    Hemoglobin 10.4 (*)    HCT 32.1 (*)  All other components within normal limits  URINALYSIS, ROUTINE W REFLEX MICROSCOPIC - Abnormal; Notable for the following components:   Glucose, UA 500 (*)    Hgb urine dipstick SMALL (*)    Ketones, ur TRACE (*)    Protein, ur >300 (*)    All other components within normal limits  CBG MONITORING, ED - Abnormal; Notable for the following components:   Glucose-Capillary 250 (*)    All other components within normal limits  I-STAT VENOUS BLOOD GAS, ED - Abnormal; Notable for the following components:   pH, Ven 7.472 (*)    pCO2, Ven 39.9 (*)    Bicarbonate 29.2 (*)    Acid-Base Excess 5.0 (*)    HCT 31.0 (*)    Hemoglobin 10.5 (*)    All other components within normal limits  CBG MONITORING, ED - Abnormal; Notable for the following components:   Glucose-Capillary 255 (*)    All other components within normal limits  PREGNANCY, URINE  CBG MONITORING, ED    EKG None  Radiology No results found.  Procedures Procedures    Medications Ordered in ED Medications  promethazine (PHENERGAN) 12.5 mg in sodium chloride 0.9 % 50 mL IVPB (12.5 mg Intravenous New Bag/Given 08/30/22 0822)  promethazine (PHENERGAN) 25 MG/ML injection (has no administration in time range)  albuterol (VENTOLIN HFA) 108 (90 Base) MCG/ACT inhaler 2 puff (has no administration in time range)  hydrALAZINE (APRESOLINE) tablet 50 mg (has no administration in time range)  metoprolol succinate (TOPROL-XL) 24 hr tablet  25 mg (has no administration in time range)  torsemide (DEMADEX) tablet 60 mg (has no administration in time range)  insulin aspart (novoLOG) injection 0-9 Units (has no administration in time range)  insulin aspart (novoLOG) injection 0-5 Units (has no administration in time range)  sodium chloride 0.9 % bolus 1,000 mL (0 mLs Intravenous Stopped 08/30/22 0930)  LORazepam (ATIVAN) injection 1 mg (1 mg Intravenous Given 08/30/22 0803)  hydrALAZINE (APRESOLINE) injection 20 mg (20 mg Intravenous Given 08/30/22 0807)  ketorolac (TORADOL) 15 MG/ML injection 15 mg (15 mg Intravenous Given 08/30/22 1105)  haloperidol lactate (HALDOL) injection 2 mg (2 mg Intravenous Given 08/30/22 1105)    ED Course/ Medical Decision Making/ A&P Clinical Course as of 08/30/22 1518  Fri Aug 30, 2022  1112 Still persistent nausea and abdominal pain.  Toradol and Haldol ordered [MT]  1410 Patient continues have persistent nausea.  At this point we will admit her to the hospital and she is willing to stay at this time.  Given her diabetes, poorly controlled sugars, and chronic kidney disease, she is high risk of worsening dehydration, hyperglycemic crisis, or other life-threatening condition without appropriate treatment of her medical pathology. [MT]  1443 Admitted to hospitalist [MT]    Clinical Course User Index [MT] Fredrick Geoghegan, Carola Rhine, MD                             Medical Decision Making Amount and/or Complexity of Data Reviewed Labs: ordered.  Risk Prescription drug management. Decision regarding hospitalization.   This patient presents to the ED with concern for nausea, vomiting. This involves an extensive number of treatment options, and is a complaint that carries with it a high risk of complications and morbidity.  The differential diagnosis includes persistent gastroparesis most likely versus viral gastroenteritis versus foodborne illness versus other  Co-morbidities that complicate the patient  evaluation: History diabetes at high risk of hypoglycemia  complications from nausea vomiting  External records from outside source obtained and reviewed including EGD report Oct 2023  I ordered and personally interpreted labs.  The pertinent results include: No acidosis.  Trace ketones.  Chronic kidney disease with creatinine 3.5 near baseline level.  Glucose 287.  Pregnancy negative.  UA without evidence of infection.  No leukocytosis.  Hemoglobin at baseline.  No anion gap.  I do not see evidence of diabetic ketoacidosis at this time.  No indication for insulin infusion at this time.  The patient was maintained on a cardiac monitor.  I personally viewed and interpreted the cardiac monitored which showed an underlying rhythm of: Sinus tachycardia  ECG from 08/28/21 showed Qtc 433  I ordered medication including IV fluid, IV Ativan, IV Phenergan for suspected gastroparesis nausea and vomiting.  Additional medication for IV pain and nausea.  Patient started on insulin dosing -insulin pump is currently deactivated.  Home medications also ordered while boarding in ED  I have reviewed the patients home medicines and have made adjustments as needed  Test Considered: I will lower suspicion for acute intra-abdominal infection to warrant CT imaging of the abdomen at this time.   After the interventions noted above, I reevaluated the patient and found that they have: stayed the same   Dispostion:  After consideration of the diagnostic results and the patients response to treatment, I feel that the patent would benefit from medical admission         Final Clinical Impression(s) / ED Diagnoses Final diagnoses:  Gastroparesis  Nausea and vomiting, unspecified vomiting type    Rx / DC Orders ED Discharge Orders     None         Wyvonnia Dusky, MD 08/30/22 [phone]

## 2022-08-30 NOTE — Assessment & Plan Note (Signed)
Elevated on arrival and given IV hydralazine Home medication started and bp much better Continue hydralazine 50mg  TID, toprol-xl 25mg  daily, and torsemide Continue IV hydralazine prn in case she can not keep medication down

## 2022-08-30 NOTE — ED Notes (Signed)
Attempt to call report to 2W  No answer.  Will call back later

## 2022-08-30 NOTE — ED Notes (Signed)
Handoff report given to Autumn RN on 2W at [facility]

## 2022-08-30 NOTE — H&P (Signed)
History and Physical    Patient: [name] NKN:[id number] DOB: [birth year] DOA: 08/30/2022 DOS: the patient was seen and examined on 08/31/2022 PCP: [name], PA  Patient coming from:  [facility]  - lives with her 2 sons.    Chief Complaint: intractable N/V  HPI: [name] is a 52 y.o. 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 Symptoms started last Friday. She states it feels typical of her gastroparesis. She was unable to keep her pills/water or any food down and visited ED x 2 over the past week.  She states she would throw up 50x/day. She has pain in her epigastric area and back. She denies any sick contacts, recent illness, or drug use. She had one episode of diarrhea this AM. She has multiple complaints including headache, chest pain, shortness of breath, stomach pain. Denies fevers, but has had chills. No urinary complaints, denies leg swelling.    She is on the following anti-emetics at home: scheduled scopolamine patch, scheduled hydroxyzine, prn compazine, ativan.   She does not smoke or drink alcohol.    ER Course:  vitals: afebrile, bp: 187/107, HR; 103, RR: 18, oxygen: 99%RA Pertinent labs: hgb: 10.4, BUN: 26, creatinine: 3.49, UA: trace ketones,  In ED: given 1L IVF, toradol, haldol, hydralazine, ativan. Home bp meds started. [facility] asked to admit.   Review of Systems: As mentioned in the history of present illness. All other systems reviewed and are negative. Past Medical History:  Diagnosis Date   Anemia    Anxiety    Arthritis    Asthma    CHF (congestive heart failure) ([place])    Depression    Diabetic ulcer of left foot ([place]) 05/12/2013   Gastroparesis    Generalized abdominal pain    History of chicken pox    Loss of weight  12/02/2019   Migraines    Mood disorder (HCC)    anxiety   Prurigo nodularis    with diabetic dermopathy   Type 1 diabetes, uncontrolled, with neuropathy    [place]   Ulcers of both lower legs ([place]) 02/20/2014   Past Surgical History:  Procedure Laterality Date   BIOPSY  08/11/2020   Procedure: BIOPSY;  Surgeon: Ladene Artist, MD;  Location: [facility];  Service: Endoscopy;;   BIOPSY  04/02/2022   Procedure: BIOPSY;  Surgeon: Jackquline Denmark, MD;  Location: [facility];  Service: Gastroenterology;;   ESOPHAGOGASTRODUODENOSCOPY (EGD) WITH PROPOFOL N/A 08/11/2020   Procedure: ESOPHAGOGASTRODUODENOSCOPY (EGD) WITH PROPOFOL;  Surgeon: Ladene Artist, MD;  Location: [facility];  Service: Endoscopy;  Laterality: N/A;   ESOPHAGOGASTRODUODENOSCOPY (EGD) WITH PROPOFOL N/A 04/02/2022   Procedure: ESOPHAGOGASTRODUODENOSCOPY (EGD) WITH PROPOFOL;  Surgeon: Jackquline Denmark, MD;  Location: [facility];  Service: Gastroenterology;  Laterality: N/A;   ESOPHAGOGASTRODUODENOSCOPY (EGD) WITH PROPOFOL N/A 05/23/2022   Procedure: ESOPHAGOGASTRODUODENOSCOPY (EGD) WITH PROPOFOL;  Surgeon: Lucilla Lame, MD;  Location: [facility];  Service: Endoscopy;  Laterality: N/A;   ICD IMPLANT N/A 04/09/2022   Procedure: ICD IMPLANT;  Surgeon: Constance Haw, MD;  Location: [facility];  Service: Cardiovascular;  Laterality: N/A;   treadmill stress test  01/2013   WNL, low risk study   Social History:  reports that she has never smoked. She has never used smokeless tobacco. She reports that she does not  currently use alcohol. She reports that she does not use drugs.  Allergies  Allergen Reactions   Atorvastatin Other (See Comments)    Dizziness    Dulaglutide Nausea And Vomiting and Other (See Comments)    Caused pancreatitis    Zofran [Ondansetron] Nausea And Vomiting and Other (See Comments)    Causes nausea vomiting to worsen / doesn't really work for patient.   Amoxicillin Itching and Rash    Lantus [Insulin Glargine] Itching and Rash   Miconazole Nitrate Nausea And Vomiting and Rash    Family History  Problem Relation Age of Onset   Diabetes Mother        type 2   Hypertension Mother    Thyroid disease Mother    Bipolar disorder Mother    Heart disease Mother    Calcium disorder Mother    Cancer Maternal Grandmother        Breast, stomach   Cancer Paternal Grandmother        stomach, lung (smoker)   Diabetes Paternal Grandmother    Diabetes Paternal Grandfather    Heart failure Sister    Diabetes Sister    Stroke Maternal Aunt    Cancer Maternal Uncle        prostate   CAD Maternal Aunt        stents   Cancer Maternal Aunt 64       ovarian    Prior to Admission medications   Medication Sig Start Date End Date Taking? Authorizing Provider  acetaminophen (TYLENOL) 500 MG tablet Take 1,000 mg by mouth 3 (three) times daily as needed for moderate pain.    [provider]  albuterol (PROAIR HFA) 108 (90 Base) MCG/ACT inhaler Inhale 2 puffs into the lungs every 6 (six) hours as needed for wheezing or shortness of breath. 07/03/20   [name], PA-C  Carboxymethylcellul-Glycerin (LUBRICATING EYE DROPS OP) Place 1-2 drops into both eyes daily as needed (dry eyes).    [provider]  Continuous Blood Gluc Sensor (FREESTYLE LIBRE 2 SENSOR) MISC Inject 1 Device into the skin every 14 (fourteen) days. 03/20/20   [provider]  Ensure Max Protein (ENSURE MAX PROTEIN) LIQD Take 330 mLs (11 oz total) by mouth daily. 04/30/22   Hongalgi, [name], MD  hydrALAZINE (APRESOLINE) 50 MG tablet Take 1 tablet (50 mg total) by mouth 3 (three) times daily. 07/17/22   Domenic Polite, MD  hydrOXYzine (ATARAX) 50 MG tablet Take 50 mg by mouth in the morning, at noon, and at bedtime.    [provider]  insulin aspart (NOVOLOG) 100 UNIT/ML injection Inject into the skin See admin instructions. Patient uses this with the [place].  Units vary according  to blood sugar.    [provider]  LORazepam (ATIVAN) 0.5 MG tablet Take 1 tablet (0.5 mg total) by mouth every 8 (eight) hours as needed for anxiety (Refractory nausea and vomiting). 07/17/22   Domenic Polite, MD  metoCLOPramide (REGLAN) 10 MG tablet Take 1 tablet (10 mg total) by mouth 3 (three) times daily before meals. 07/17/22   Domenic Polite, MD  metoprolol succinate (TOPROL XL) 25 MG 24 hr tablet Take 1 tablet (25 mg total) by mouth daily. 04/19/22   [name], PA-C  Multiple Vitamin (MULTIVITAMIN WITH MINERALS) TABS tablet Take 1 tablet by mouth daily. 04/30/22   Hongalgi, [name], MD  pantoprazole (PROTONIX) 40 MG tablet Take 1 tablet (40 mg total) by mouth 2 (two) times daily before  a meal. 05/24/22 07/23/22  Rai, Ripudeep K, MD  polyethylene glycol (MIRALAX / GLYCOLAX) 17 g packet Take 17 g by mouth daily as needed for moderate constipation.    [provider]  prochlorperazine (COMPAZINE) 10 MG tablet Take 1 tablet (10 mg total) by mouth every 6 (six) hours as needed for nausea or vomiting. 05/24/22   Rai, [name], MD  promethazine (PHENERGAN) 25 MG suppository Unwrap and place 1 suppository (25 mg total) rectally every 6 (six) hours as needed for nausea or vomiting. 07/17/22   Domenic Polite, MD  rosuvastatin (CRESTOR) 20 MG tablet Take 1 tablet (20 mg total) by mouth daily. 05/24/22 09/21/22  Rai, [name], MD  scopolamine (TRANSDERM-SCOP) 1 MG/3DAYS Place 1 patch (1.5 mg total) onto the skin every 3 (three) days. 05/26/22   Rai, Ripudeep K, MD  tobramycin (TOBREX) 0.3 % ophthalmic solution Place 1-2 drops into both eyes See admin instructions. Instill 1-2 drops into both eyes 3 times daily the day before, day of, and the day after every 6 weeks eye injections per patient 11/17/20   [provider]  torsemide (DEMADEX) 20 MG tablet TAKE THREE TABLETS BY MOUTH DAILY 08/30/22   Darrick Grinder D, NP    Physical Exam: Vitals:   08/30/22 2005  08/30/22 2030 08/30/22 2130 08/30/22 2340  BP: 127/81 113/76 136/89 130/77  Pulse: 87 89 85 81  Resp: $Remo'13 20 12   'ZUBuJ$ Temp: 99.3 F (37.4 C)  98 F (36.7 C) 98.6 F (37 C)  TempSrc: Oral   Oral  SpO2: 100% 100% 100%   Weight:      Height:       General:  Appears calm and comfortable and is in NAD, but dry heaving/vomiting at times.  Eyes:  PERRL, EOMI, normal lids, iris ENT:  grossly normal hearing, lips & tongue, mmm; appropriate dentition Neck:  no LAD, masses or thyromegaly; no carotid bruits Cardiovascular:  RRR, no m/r/g. No LE edema.  Respiratory:   CTA bilaterally with no wheezes/rales/rhonchi.  Normal respiratory effort. Abdomen:  soft, TTP epigastric area and LUQ, ND, hypoactive BS  Back:   normal alignment, no CVAT. TTP along SI joints, paraspinal muscles.  Skin:  no rash or induration seen on limited exam Musculoskeletal:  grossly normal tone BUE/BLE, good ROM, no bony abnormality Lower extremity:  No LE edema.  Limited foot exam with no ulcerations.  2+ distal pulses. Psychiatric:  grossly normal mood and affect, speech fluent and appropriate, AOx3 Neurologic:  CN 2-12 grossly intact, moves all extremities in coordinated fashion, sensation intact   Radiological Exams on Admission: Independently reviewed - see discussion in A/P where applicable  DG CHEST PORT 1 VIEW  Result Date: 08/30/2022 CLINICAL DATA:  Acute on chronic diastolic CHF EXAM: PORTABLE CHEST 1 VIEW COMPARISON:  08/27/2022 FINDINGS: Stable cardiomegaly.  Left chest wall ICD. Pulmonary vascular congestion. Retrocardiac atelectasis. Possible small left pleural effusion. No pneumothorax. No acute osseous abnormality. IMPRESSION: Cardiomegaly and pulmonary vascular congestion. Possible small left pleural effusion. Electronically Signed   By: Placido Sou M.D.   On: 08/30/2022 23:50    EKG: pending   Labs on Admission: I have personally reviewed the available labs and imaging studies at the time of the  admission.  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 Renal function stable, trace ketones in urine. CXR showing congestion/edema in setting of  her not being able to take her medication. Will hold on IVF for now -start IV reglan scheduled BID with renal function, PRN phernergan and IV ativan for refractory N/V. Continue scopolamine patch and hydroxyzine.  -check UDS/magnesium  -stomach and back hurt and c/o of chest pain. Lipase wnl, start IV Protonix, check troponin, EKG. Would check back xray if no improvement in symptoms. No radicular symptoms or red flags on exam.   -advance diet as tolerated   Chronic diastolic CHF (congestive heart failure) ([place]) She has no edema/weight gain Has been unable to take her medication Torsemide increased to 40mg  then to 60mg  by cardiology  CXR shows pulmonary vascular congestion/edema Will check BNP Echo 04/2022: EF of 50-55%. Grade 2 DD.  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 ([place]) A1C in 12/23: 6.4  Her insulin pump was turned off at [facility] Continue long acting levemir 10 units for basal dosing SSI and accuchecks qac/hs   CKD (chronic kidney disease) stage 4, GFR 15-29 ml/min (HCC) Baseline creatinine  now around 3.5-stable today  Seen by vascular on 1/22 to discuss HD options, but recommended waiting until closer to need dialysis or per nephrology  Strict I/O Avoid nephrotoxic drugs Trend   Anemia of chronic disease Baseline hgb 8-11, stable Continue to monitor   Essential hypertension Elevated on arrival and given IV hydralazine Home medication started and bp much better Continue hydralazine 50mg  TID, toprol-xl 25mg  daily, and torsemide Continue IV hydralazine prn in case she can not keep medication down     Advance Care Planning:   Code Status: Full Code   Consults: none   DVT Prophylaxis: lovenox   Family Communication: none   Severity of Illness: The appropriate patient status for this patient is OBSERVATION. Observation status is judged to be reasonable and necessary in order to provide the required intensity of service to ensure the patient's safety. The patient's presenting symptoms, physical exam findings, and initial radiographic and laboratory data in the context of their medical condition is felt to place them at decreased risk for further clinical deterioration. Furthermore, it is anticipated that the patient will be medically stable for discharge from the hospital within 2 midnights of admission.   Author: Orma Flaming, MD 08/31/2022 12:29 AM  For on call review [email or web address].

## 2022-08-30 NOTE — Assessment & Plan Note (Addendum)
Baseline creatinine now around 3.5-stable today  Seen by vascular on 1/22 to discuss HD options, but recommended waiting until closer to need dialysis or per nephrology  Strict I/O Avoid nephrotoxic drugs Trend

## 2022-08-30 NOTE — Assessment & Plan Note (Signed)
Baseline hgb 8-11, stable Continue to monitor

## 2022-08-30 NOTE — Progress Notes (Signed)
VBG completed , and handed to Physician

## 2022-08-31 DIAGNOSIS — D631 Anemia in chronic kidney disease: Secondary | ICD-10-CM | POA: Diagnosis not present

## 2022-08-31 DIAGNOSIS — I5032 Chronic diastolic (congestive) heart failure: Secondary | ICD-10-CM | POA: Diagnosis not present

## 2022-08-31 DIAGNOSIS — Z833 Family history of diabetes mellitus: Secondary | ICD-10-CM | POA: Diagnosis not present

## 2022-08-31 DIAGNOSIS — Z8249 Family history of ischemic heart disease and other diseases of the circulatory system: Secondary | ICD-10-CM | POA: Diagnosis not present

## 2022-08-31 DIAGNOSIS — N184 Chronic kidney disease, stage 4 (severe): Secondary | ICD-10-CM | POA: Diagnosis not present

## 2022-08-31 DIAGNOSIS — R112 Nausea with vomiting, unspecified: Secondary | ICD-10-CM | POA: Diagnosis not present

## 2022-08-31 DIAGNOSIS — Z880 Allergy status to penicillin: Secondary | ICD-10-CM | POA: Diagnosis not present

## 2022-08-31 DIAGNOSIS — Z823 Family history of stroke: Secondary | ICD-10-CM | POA: Diagnosis not present

## 2022-08-31 DIAGNOSIS — K3184 Gastroparesis: Secondary | ICD-10-CM

## 2022-08-31 DIAGNOSIS — F419 Anxiety disorder, unspecified: Secondary | ICD-10-CM | POA: Diagnosis not present

## 2022-08-31 DIAGNOSIS — F32A Depression, unspecified: Secondary | ICD-10-CM | POA: Diagnosis not present

## 2022-08-31 DIAGNOSIS — E1022 Type 1 diabetes mellitus with diabetic chronic kidney disease: Secondary | ICD-10-CM | POA: Diagnosis not present

## 2022-08-31 DIAGNOSIS — R0989 Other specified symptoms and signs involving the circulatory and respiratory systems: Secondary | ICD-10-CM | POA: Diagnosis present

## 2022-08-31 DIAGNOSIS — Z794 Long term (current) use of insulin: Secondary | ICD-10-CM | POA: Diagnosis not present

## 2022-08-31 DIAGNOSIS — Z8349 Family history of other endocrine, nutritional and metabolic diseases: Secondary | ICD-10-CM | POA: Diagnosis not present

## 2022-08-31 DIAGNOSIS — I130 Hypertensive heart and chronic kidney disease with heart failure and stage 1 through stage 4 chronic kidney disease, or unspecified chronic kidney disease: Secondary | ICD-10-CM | POA: Diagnosis not present

## 2022-08-31 DIAGNOSIS — Z888 Allergy status to other drugs, medicaments and biological substances status: Secondary | ICD-10-CM | POA: Diagnosis not present

## 2022-08-31 DIAGNOSIS — Z79899 Other long term (current) drug therapy: Secondary | ICD-10-CM | POA: Diagnosis not present

## 2022-08-31 DIAGNOSIS — E1043 Type 1 diabetes mellitus with diabetic autonomic (poly)neuropathy: Secondary | ICD-10-CM | POA: Diagnosis not present

## 2022-08-31 LAB — CBC WITH DIFFERENTIAL/PLATELET
Abs Immature Granulocytes: 0.01 10*3/uL (ref 0.00–0.07)
Basophils Absolute: 0 10*3/uL (ref 0.0–0.1)
Basophils Relative: 1 %
Eosinophils Absolute: 0.1 10*3/uL (ref 0.0–0.5)
Eosinophils Relative: 2 %
HCT: 26.8 % — ABNORMAL LOW (ref 36.0–46.0)
Hemoglobin: 8.6 g/dL — ABNORMAL LOW (ref 12.0–15.0)
Immature Granulocytes: 0 %
Lymphocytes Relative: 39 %
Lymphs Abs: 2.1 10*3/uL (ref 0.7–4.0)
MCH: 28.2 pg (ref 26.0–34.0)
MCHC: 32.1 g/dL (ref 30.0–36.0)
MCV: 87.9 fL (ref 80.0–100.0)
Monocytes Absolute: 0.3 10*3/uL (ref 0.1–1.0)
Monocytes Relative: 6 %
Neutro Abs: 2.8 10*3/uL (ref 1.7–7.7)
Neutrophils Relative %: 52 %
Platelets: 225 10*3/uL (ref 150–400)
RBC: 3.05 MIL/uL — ABNORMAL LOW (ref 3.87–5.11)
RDW: 14.3 % (ref 11.5–15.5)
WBC: 5.4 10*3/uL (ref 4.0–10.5)
nRBC: 0 % (ref 0.0–0.2)

## 2022-08-31 LAB — BRAIN NATRIURETIC PEPTIDE: B Natriuretic Peptide: 1103.7 pg/mL — ABNORMAL HIGH (ref 0.0–100.0)

## 2022-08-31 LAB — GLUCOSE, CAPILLARY
Glucose-Capillary: 130 mg/dL — ABNORMAL HIGH (ref 70–99)
Glucose-Capillary: 80 mg/dL (ref 70–99)
Glucose-Capillary: 120 mg/dL — ABNORMAL HIGH (ref 70–99)
Glucose-Capillary: 206 mg/dL — ABNORMAL HIGH (ref 70–99)
Glucose-Capillary: 202 mg/dL — ABNORMAL HIGH (ref 70–99)
Glucose-Capillary: 230 mg/dL — ABNORMAL HIGH (ref 70–99)
Glucose-Capillary: 204 mg/dL — ABNORMAL HIGH (ref 70–99)

## 2022-08-31 LAB — TROPONIN I (HIGH SENSITIVITY)
Troponin I (High Sensitivity): 60 ng/L — ABNORMAL HIGH (ref <18)
Troponin I (High Sensitivity): 52 ng/L — ABNORMAL HIGH (ref <18)

## 2022-08-31 LAB — BASIC METABOLIC PANEL
Anion gap: 8 (ref 5–15)
BUN: 25 mg/dL — ABNORMAL HIGH (ref 6–20)
CO2: 24 mmol/L (ref 22–32)
Calcium: 8.4 mg/dL — ABNORMAL LOW (ref 8.9–10.3)
Chloride: 108 mmol/L (ref 98–111)
Creatinine, Ser: 3.62 mg/dL — ABNORMAL HIGH (ref 0.44–1.00)
GFR, Estimated: 14 mL/min — ABNORMAL LOW (ref >60)
Glucose, Bld: 74 mg/dL (ref 70–99)
Potassium: 3.5 mmol/L (ref 3.5–5.1)
Sodium: 140 mmol/L (ref 135–145)

## 2022-08-31 LAB — MAGNESIUM: Magnesium: 1.9 mg/dL (ref 1.7–2.4)

## 2022-08-31 MED ORDER — SCOPOLAMINE 1 MG/3DAYS TD PT72
1.0000 | MEDICATED_PATCH | TRANSDERMAL | Status: DC
  Administered 2022-08-31: 1.5 mg via TRANSDERMAL
  Filled 2022-08-31: qty 1

## 2022-08-31 MED ORDER — PANTOPRAZOLE SODIUM 40 MG IV SOLR
40.0000 mg | INTRAVENOUS | Status: DC
  Administered 2022-08-31 – 2022-09-01 (×2): 40 mg via INTRAVENOUS
  Filled 2022-08-31 (×2): qty 10

## 2022-08-31 MED ORDER — ACETAMINOPHEN 325 MG PO TABS
650.0000 mg | ORAL_TABLET | Freq: Four times a day (QID) | ORAL | Status: DC | PRN
  Administered 2022-09-01: 650 mg via ORAL
  Filled 2022-08-31: qty 2

## 2022-08-31 MED ORDER — ACETAMINOPHEN 650 MG RE SUPP: 650.0000 mg | Freq: Four times a day (QID) | RECTAL | Status: DC | PRN

## 2022-08-31 MED ORDER — ENOXAPARIN SODIUM 30 MG/0.3ML IJ SOSY
30.0000 mg | PREFILLED_SYRINGE | Freq: Every day | INTRAMUSCULAR | Status: DC
  Administered 2022-08-31 – 2022-09-01 (×2): 30 mg via SUBCUTANEOUS
  Filled 2022-08-31 (×2): qty 0.3

## 2022-08-31 MED ORDER — HYDRALAZINE HCL 20 MG/ML IJ SOLN: 10.0000 mg | Freq: Three times a day (TID) | INTRAMUSCULAR | Status: DC | PRN

## 2022-08-31 MED ORDER — TORSEMIDE 20 MG PO TABS
20.0000 mg | ORAL_TABLET | Freq: Three times a day (TID) | ORAL | Status: DC
  Administered 2022-08-31 – 2022-09-01 (×4): 20 mg via ORAL
  Filled 2022-08-31 (×4): qty 1

## 2022-08-31 MED ORDER — METOCLOPRAMIDE HCL 5 MG/ML IJ SOLN
10.0000 mg | Freq: Three times a day (TID) | INTRAMUSCULAR | Status: DC
  Administered 2022-08-31 – 2022-09-01 (×4): 10 mg via INTRAVENOUS
  Filled 2022-08-31 (×3): qty 2

## 2022-08-31 MED ORDER — HYDROXYZINE HCL 25 MG PO TABS
50.0000 mg | ORAL_TABLET | Freq: Three times a day (TID) | ORAL | Status: DC
  Administered 2022-08-31 – 2022-09-01 (×4): 50 mg via ORAL
  Filled 2022-08-31 (×4): qty 2

## 2022-08-31 MED ORDER — LORAZEPAM 0.5 MG PO TABS
0.5000 mg | ORAL_TABLET | Freq: Two times a day (BID) | ORAL | Status: DC | PRN
  Administered 2022-08-31: 0.5 mg via ORAL
  Filled 2022-08-31: qty 1

## 2022-08-31 MED ORDER — METOCLOPRAMIDE HCL 5 MG/ML IJ SOLN
5.0000 mg | Freq: Two times a day (BID) | INTRAMUSCULAR | Status: DC
  Administered 2022-08-31 (×2): 5 mg via INTRAVENOUS
  Filled 2022-08-31 (×2): qty 2

## 2022-08-31 NOTE — Assessment & Plan Note (Addendum)
She has no edema/weight gain Has been unable to take her medication Torsemide increased to $RemoveBefo'40mg'tjUGTKKjJzp$  then to $Remov'60mg'liqRhD$  by cardiology  CXR shows pulmonary vascular congestion/edema Will check BNP Echo 04/2022: EF of 50-55%. Grade 2 DD.  Strict I/O Continue toprol and torsemide for now  May need repeat echo

## 2022-08-31 NOTE — Progress Notes (Signed)
Progress Note   Patient: [name] IFB:[id number] DOB: 11-17-1970 DOA: 08/30/2022     0 DOS: the patient was seen and examined on 08/31/2022   Brief hospital course: 52 y.o. female with medical history significant of Y7WL, diastolic CHF, diabetic gastroparesis, HTN, CKD stage IV-V, VT/VF cardiac arrest s/p CPR with ICD placement in 04/2022 for secondary prevention, ACD who presented to ED with complaints of intractable nausea and vomiting. Symptoms started last Friday. She states it feels typical of her gastroparesis. She was unable to keep her pills/water or any food down and visited ED x 2 over the past week.  She states she would throw up 50x/day. She has pain in her epigastric area and back. She denies any sick contacts, recent illness, or drug use. She had one episode of diarrhea   Assessment and Plan: * Intractable nausea and vomiting secondary to diabetic gastroparesis 52 year old presenting with one week history of intractable N/V in setting of diabetic gastroparesis -received 1L IVF in ED. Renal function stable and near baseline -initially continued on scopolamine patch and hydroxyzine -initially on reglan 5mg  bid. Increased to 10mg  IV q8hrs to better match pt's home regimen -UDS neg -did tolerate diet earlier today and pt was keen on possibly going home, however later in afternoon, reported increased nausea and feeling ill again -continue antiemetic as needed -continue to try diet as tolerated   Chronic diastolic CHF (congestive heart failure) ([place]) She has no edema/weight gain Continue home torsemide 20mg  TID CXR shows pulmonary vascular congestion/edema, pt not sob and is on room air Echo 04/2022: EF of 50-55%. Grade 2 DD.    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) A1C in 12/23: 6.4  Her insulin pump was turned off at [facility] Continue long acting levemir 10 units for basal dosing On SSI and accuchecks qac/hs    CKD (chronic kidney disease) stage 4, GFR 15-29  ml/min (HCC) Baseline creatinine now around 3.5-stable today  Seen by vascular on 1/22 to discuss HD options, but recommended waiting until closer to need dialysis or per nephrology  Avoid nephrotoxic drugs Recheck bmet in AM   Anemia of chronic disease Baseline hgb 8-11, stable Continue to monitor    Essential hypertension Elevated on arrival and given IV hydralazine Home medication started and bp now controlled and normal Continue hydralazine 50mg  TID, toprol-xl 25mg  daily, and torsemide Continue IV hydralazine prn in case she can not keep medication down       Subjective: Earlier today, reported feeling better and able to toleate PO. Later in day, reporting increased nausea and feeling ill again  Physical Exam: Vitals:   08/31/22 0740 08/31/22 1234 08/31/22 1313 08/31/22 1644  BP: 124/71  112/70 132/79  Pulse: 86  82 81  Resp: 18  18 18   Temp: 98.2 F (36.8 C)  97.7 F (36.5 C) 98.3 F (36.8 C)  TempSrc: Oral  Oral Oral  SpO2: 100% 100% 99% 97%  Weight:      Height:       General exam: Awake, laying in bed, in nad Respiratory system: Normal respiratory effort, no wheezing Cardiovascular system: regular rate, s1, s2 Gastrointestinal system: Soft, nondistended, positive BS Central nervous system: CN2-12 grossly intact, strength intact Extremities: Perfused, no clubbing Skin: Normal skin turgor, no notable skin lesions seen Psychiatry: Mood normal // no visual hallucinations   Data Reviewed:  Labs reviewed: Na 140, K 3.5, Cr 3.62, WBC 5.4, Hgb 8.6  Family Communication: Pt in room, family not  at bedside  Disposition: Status is: Observation The patient will require care spanning > 2 midnights and should be moved to inpatient because: Severity of illness  Planned Discharge Destination: Home    Author: Marylu Lund, MD 08/31/2022 4:45 PM  For on call review [email or web address].

## 2022-08-31 NOTE — Hospital Course (Signed)
52 y.o. female with medical history significant of V5IE, diastolic CHF, diabetic gastroparesis, HTN, CKD stage IV-V, VT/VF cardiac arrest s/p CPR with ICD placement in 04/2022 for secondary prevention, ACD who presented to ED with complaints of intractable nausea and vomiting. Symptoms started last Friday. She states it feels typical of her gastroparesis. She was unable to keep her pills/water or any food down and visited ED x 2 over the past week.  She states she would throw up 50x/day. She has pain in her epigastric area and back. She denies any sick contacts, recent illness, or drug use. She had one episode of diarrhea

## 2022-08-31 NOTE — Progress Notes (Signed)
Patient's EKG came back as normal sinus rhythm.

## 2022-08-31 NOTE — Assessment & Plan Note (Addendum)
52 year old presenting with one week history of intractable N/V in setting of diabetic gastroparesis -obs to med/surg -received 1L IVF in ED. Renal function stable, trace ketones in urine. CXR showing congestion/edema in setting of  her not being able to take her medication. Will hold on IVF for now -start IV reglan scheduled BID with renal function, PRN phernergan and IV ativan for refractory N/V. Continue scopolamine patch and hydroxyzine.  -check UDS/magnesium  -stomach and back hurt and c/o of chest pain. Lipase wnl, start IV Protonix, check troponin, EKG. Would check back xray if no improvement in symptoms. No radicular symptoms or red flags on exam.   -advance diet as tolerated

## 2022-09-01 ENCOUNTER — Inpatient Hospital Stay (HOSPITAL_COMMUNITY): Payer: BC Managed Care – PPO

## 2022-09-01 LAB — TROPONIN I (HIGH SENSITIVITY)
Troponin I (High Sensitivity): 31 ng/L — ABNORMAL HIGH (ref <18)
Troponin I (High Sensitivity): 30 ng/L — ABNORMAL HIGH (ref <18)

## 2022-09-01 LAB — COMPREHENSIVE METABOLIC PANEL
ALT: 7 U/L (ref 0–44)
AST: 10 U/L — ABNORMAL LOW (ref 15–41)
Albumin: 2.4 g/dL — ABNORMAL LOW (ref 3.5–5.0)
Alkaline Phosphatase: 56 U/L (ref 38–126)
Anion gap: 10 (ref 5–15)
BUN: 33 mg/dL — ABNORMAL HIGH (ref 6–20)
CO2: 22 mmol/L (ref 22–32)
Calcium: 7.9 mg/dL — ABNORMAL LOW (ref 8.9–10.3)
Chloride: 105 mmol/L (ref 98–111)
Creatinine, Ser: 4.12 mg/dL — ABNORMAL HIGH (ref 0.44–1.00)
GFR, Estimated: 12 mL/min — ABNORMAL LOW (ref >60)
Glucose, Bld: 147 mg/dL — ABNORMAL HIGH (ref 70–99)
Potassium: 3.7 mmol/L (ref 3.5–5.1)
Sodium: 137 mmol/L (ref 135–145)
Total Bilirubin: 0.6 mg/dL (ref 0.3–1.2)
Total Protein: 5.2 g/dL — ABNORMAL LOW (ref 6.5–8.1)

## 2022-09-01 LAB — GLUCOSE, CAPILLARY
Glucose-Capillary: 150 mg/dL — ABNORMAL HIGH (ref 70–99)
Glucose-Capillary: 240 mg/dL — ABNORMAL HIGH (ref 70–99)
Glucose-Capillary: 265 mg/dL — ABNORMAL HIGH (ref 70–99)

## 2022-09-01 MED ORDER — CALCIUM CARBONATE ANTACID 500 MG PO CHEW: 1.0000 | CHEWABLE_TABLET | Freq: Three times a day (TID) | ORAL | Status: DC | PRN

## 2022-09-01 NOTE — Plan of Care (Signed)

## 2022-09-01 NOTE — Discharge Summary (Signed)
Physician Discharge Summary   Patient: [name] MRN: [id number] DOB: 22-Jul-1971  Admit date:     08/30/2022  Discharge date: 09/01/22  Discharge Physician: Marylu Lund   PCP: [name], PA   Recommendations at discharge:    Follow up with PCP in 1-2 weeks Follow up with nephrology as scheduled   Discharge Diagnoses: Principal Problem:   Intractable nausea and vomiting secondary to diabetic gastroparesis Active Problems:   Chronic diastolic CHF (congestive heart failure) (HCC)   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)   CKD (chronic kidney disease) stage 4, GFR 15-29 ml/min (HCC)   Anemia of chronic disease   Essential hypertension   Diabetic gastroparesis ([place])   Gastroparesis  Resolved Problems:   * No resolved hospital problems. *  Hospital Course: 52 y.o. 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 Symptoms started last Friday. She states it feels typical of her gastroparesis. She was unable to keep her pills/water or any food down and visited ED x 2 over the past week.  She states she would throw up 50x/day. She has pain in her epigastric area and back. She denies any sick contacts, recent illness, or drug use. She had one episode of diarrhea   Assessment and Plan: * Intractable nausea and vomiting secondary to diabetic gastroparesis 52 year old presenting with one week history of intractable N/V in setting of diabetic gastroparesis -received 1L IVF in ED. Renal function stable and near baseline -initially continued on scopolamine patch and hydroxyzine -Was continued on reglan 10mg  IV q8hrs  -UDS neg -Ultimately was able to tolerate diet without issues   Chronic diastolic CHF (congestive heart failure) ([place]) She has no  edema/weight gain, at baseline dry wt Continued home torsemide 20mg  TID CXR shows pulmonary vascular congestion/edema, pt not sob and is on room air Echo 04/2022: EF of 50-55%. Grade 2 DD.  Some chest pain toward day of discharge. Trop trended down. Reviewed EKG with Cardiology, found to be largely unchanged from prior studies   Type 1 diabetes mellitus with diabetic neuropathy ([place]) A1C in 12/23: 6.4  Her insulin pump was turned off at [facility] Was continued on 10 units long-acting with SSI while in hospital Would resume insulin pump on d/c   CKD (chronic kidney disease) stage 4, GFR 15-29 ml/min (HCC) Baseline creatinine now around 3.5-stable  Seen by vascular on 1/22 to discuss HD options, but recommended waiting until closer to need dialysis or per nephrology  Avoid nephrotoxic drugs Cr did peak to 4.1 this visit. Voiding well this visit. Did discuss with Nephrology, ok for d/c from Nephro standpoint with close f/u   Anemia of chronic disease Baseline hgb 8-11, stable Continue to monitor    Essential hypertension Elevated on arrival and given IV hydralazine Home medication started and bp now controlled and normal Continue hydralazine 50mg  TID, toprol-xl 25mg  daily, and torsemide Continue IV hydralazine prn in case she can not keep medication down        Consultants: discussed case with Cardiology and Nephrology Procedures performed:   Disposition: Home Diet recommendation:  Carb modified diet DISCHARGE MEDICATION: Allergies as of 09/01/2022       Reactions   Atorvastatin Other (See Comments)   Dizziness    Dulaglutide Nausea And Vomiting, Other (See Comments)  Caused pancreatitis    Zofran [ondansetron] Nausea And Vomiting, Other (See Comments)   Causes nausea vomiting to worsen / doesn't really work for patient.   Amoxicillin Itching, Rash   Lantus [insulin Glargine] Itching, Rash   Miconazole Nitrate Nausea And Vomiting, Rash        Medication List     TAKE  these medications    albuterol 108 (90 Base) MCG/ACT inhaler Commonly known as: ProAir HFA Inhale 2 puffs into the lungs every 6 (six) hours as needed for wheezing or shortness of breath.   Ensure Max Protein Liqd Take 330 mLs (11 oz total) by mouth daily.   FreeStyle Libre 2 Sensor Misc Inject 1 Device into the skin every 14 (fourteen) days.   hydrALAZINE 50 MG tablet Commonly known as: APRESOLINE Take 1 tablet (50 mg total) by mouth 3 (three) times daily.   hydrOXYzine 50 MG tablet Commonly known as: ATARAX Take 50 mg by mouth in the morning, at noon, and at bedtime.   insulin aspart 100 UNIT/ML injection Commonly known as: novoLOG Inject 150 Units into the skin See admin instructions. Patient uses this with the [place]. Units vary according to blood sugar. Loads 150 units every 3 days.   LORazepam 0.5 MG tablet Commonly known as: ATIVAN Take 1 tablet (0.5 mg total) by mouth every 8 (eight) hours as needed for anxiety (Refractory nausea and vomiting).   metoCLOPramide 10 MG tablet Commonly known as: Reglan Take 1 tablet (10 mg total) by mouth 3 (three) times daily before meals.   metoprolol succinate 25 MG 24 hr tablet Commonly known as: Toprol XL Take 1 tablet (25 mg total) by mouth daily.   multivitamin with minerals Tabs tablet Take 1 tablet by mouth daily.   pantoprazole 40 MG tablet Commonly known as: Protonix Take 1 tablet (40 mg total) by mouth 2 (two) times daily before a meal.   polyethylene glycol 17 g packet Commonly known as: MIRALAX / GLYCOLAX Take 17 g by mouth daily as needed for moderate constipation.   prochlorperazine 10 MG tablet Commonly known as: COMPAZINE Take 1 tablet (10 mg total) by mouth every 6 (six) hours as needed for nausea or vomiting.   promethazine 25 MG suppository Commonly known as: PHENERGAN Unwrap and place 1 suppository (25 mg total) rectally every 6 (six) hours as needed for nausea or vomiting.   rosuvastatin 20 MG  tablet Commonly known as: CRESTOR Take 1 tablet (20 mg total) by mouth daily.   scopolamine 1 MG/3DAYS Commonly known as: TRANSDERM-SCOP Place 1 patch (1.5 mg total) onto the skin every 3 (three) days.   tobramycin 0.3 % ophthalmic solution Commonly known as: TOBREX Place 1-2 drops into both eyes See admin instructions. Instill 1-2 drops into both eyes 3 times daily the day before, day of, and the day after every 6 weeks eye injections per patient   torsemide 20 MG tablet Commonly known as: DEMADEX TAKE THREE TABLETS BY MOUTH DAILY        Follow-up Information     [name], PA Follow up in 2 week(s).   Specialty: Internal Medicine Why: Hospital follow up Contact information: [address] [place] [address] [phone]         Rosita Fire, MD Follow up.   Specialties: Nephrology, Internal Medicine Why: as scheduled Contact information: [address] [place] [address] [phone]                Discharge  Exam: Filed Weights   08/30/22 0732  Weight: 80.7 kg   General exam: Awake, laying in bed, in nad Respiratory system: Normal respiratory effort, no wheezing Cardiovascular system: regular rate, s1, s2 Gastrointestinal system: Soft, nondistended, positive BS Central nervous system: CN2-12 grossly intact, strength intact Extremities: Perfused, no clubbing Skin: Normal skin turgor, no notable skin lesions seen Psychiatry: Mood normal // no visual hallucinations   Condition at discharge: fair  The results of significant diagnostics from this hospitalization (including imaging, microbiology, ancillary and laboratory) are listed below for reference.   Imaging Studies: DG CHEST PORT 1 VIEW  Result Date: 09/01/2022 CLINICAL DATA:  Chest pain. EXAM: PORTABLE CHEST 1 VIEW COMPARISON:  08/30/2022 FINDINGS: Stable enlarged cardiac silhouette and left subclavian AICD lead. Stable minimal linear atelectasis or scarring in the  left mid and right upper lung zone. Interval minimal patchy density at both lung bases. Small bilateral pleural effusions, slightly increased. Unremarkable bones. IMPRESSION: 1. Interval minimal patchy atelectasis or pneumonia at both lung bases. 2. Small bilateral pleural effusions, slightly increased. 3. Stable cardiomegaly. Electronically Signed   By: Claudie Revering M.D.   On: 09/01/2022 11:56   DG CHEST PORT 1 VIEW  Result Date: 08/30/2022 CLINICAL DATA:  Acute on chronic diastolic CHF EXAM: PORTABLE CHEST 1 VIEW COMPARISON:  08/27/2022 FINDINGS: Stable cardiomegaly.  Left chest wall ICD. Pulmonary vascular congestion. Retrocardiac atelectasis. Possible small left pleural effusion. No pneumothorax. No acute osseous abnormality. IMPRESSION: Cardiomegaly and pulmonary vascular congestion. Possible small left pleural effusion. Electronically Signed   By: Placido Sou M.D.   On: 08/30/2022 23:50   DG Chest Portable 1 View  Result Date: 08/27/2022 CLINICAL DATA:  Vomiting. EXAM: PORTABLE CHEST 1 VIEW COMPARISON:  07/22/2022. FINDINGS: 1444 hours. Left chest AICD in unchanged position with leads projecting over the right ventricle. Low lung volumes accentuate the pulmonary vasculature and cardiomediastinal silhouette. No focal airspace opacity. Mild peribronchial thickening. Stable cardiomegaly and mediastinal contours. No pleural effusion or pneumothorax. IMPRESSION: Low lung volumes with mild peribronchial thickening. No focal airspace opacity. Electronically Signed   By: Emmit Alexanders M.D.   On: 08/27/2022 15:02   DG Abd Portable 2 Views  Result Date: 08/27/2022 CLINICAL DATA:  Vomiting. EXAM: PORTABLE ABDOMEN - 2 VIEW COMPARISON:  08/23/2022 FINDINGS: Artifact overlies the abdomen. There is no evidence of ileus, obstruction or visible free air. Relative paucity of bowel contents is noted. No abnormal calcifications or significant bone findings. IMPRESSION: No acute finding. Relative paucity of bowel  contents. Electronically Signed   By: Nelson Chimes M.D.   On: 08/27/2022 15:00   DG Abdomen 1 View  Result Date: 08/23/2022 CLINICAL DATA:  Intractable vomiting EXAM: ABDOMEN - 1 VIEW COMPARISON:  07/21/2022 FINDINGS: Inferior pelvis excluded from view. Normal abdominal gas pattern. No gross free intraperitoneal gas. Stable phleboliths within the pelvis bilaterally. No definite renal or ureteral calculi. Osseous structures are unremarkable. IMPRESSION: 1. Negative. Electronically Signed   By: Fidela Salisbury M.D.   On: 08/23/2022 19:49   CUP PACEART REMOTE DEVICE CHECK  Result Date: 08/07/2022 Scheduled remote reviewed. Normal device function.  3 NSVT, no therapy Next remote 91 days. [place]   Microbiology: Results for orders placed or performed during the hospital encounter of 07/12/22  Resp Panel by RT-PCR (Flu A&B, Covid) Anterior Nasal Swab     Status: None   Collection Time: 07/12/22  5:42 AM   Specimen: Anterior Nasal Swab  Result Value Ref Range Status   SARS Coronavirus 2 by  RT PCR NEGATIVE NEGATIVE Final    Comment: (NOTE) SARS-CoV-2 target nucleic acids are NOT DETECTED.  The SARS-CoV-2 RNA is generally detectable in upper respiratory specimens during the acute phase of infection. The lowest concentration of SARS-CoV-2 viral copies this assay can detect is 138 copies/mL. A negative result does not preclude SARS-Cov-2 infection and should not be used as the sole basis for treatment or other patient management decisions. A negative result may occur with  improper specimen collection/handling, submission of specimen other than nasopharyngeal swab, presence of viral mutation(s) within the areas targeted by this assay, and inadequate number of viral copies(<138 copies/mL). A negative result must be combined with clinical observations, patient history, and epidemiological information. The expected result is Negative.  Fact Sheet for Patients:   EntrepreneurPulse.com.au  Fact Sheet for Healthcare Providers:  IncredibleEmployment.be  This test is no t yet approved or cleared by the [place] FDA and  has been authorized for detection and/or diagnosis of SARS-CoV-2 by FDA under an Emergency Use Authorization (EUA). This EUA will remain  in effect (meaning this test can be used) for the duration of the COVID-19 declaration under Section 564(b)(1) of the Act, 21 U.S.C.section 360bbb-3(b)(1), unless the authorization is terminated  or revoked sooner.       Influenza A by PCR NEGATIVE NEGATIVE Final   Influenza B by PCR NEGATIVE NEGATIVE Final    Comment: (NOTE) The Xpert Xpress SARS-CoV-2/FLU/RSV plus assay is intended as an aid in the diagnosis of influenza from Nasopharyngeal swab specimens and should not be used as a sole basis for treatment. Nasal washings and aspirates are unacceptable for Xpert Xpress SARS-CoV-2/FLU/RSV testing.  Fact Sheet for Patients: EntrepreneurPulse.com.au  Fact Sheet for Healthcare Providers: IncredibleEmployment.be  This test is not yet approved or cleared by the [place] FDA and has been authorized for detection and/or diagnosis of SARS-CoV-2 by FDA under an Emergency Use Authorization (EUA). This EUA will remain in effect (meaning this test can be used) for the duration of the COVID-19 declaration under Section 564(b)(1) of the Act, 21 U.S.C. section 360bbb-3(b)(1), unless the authorization is terminated or revoked.  Performed at [facility], [address] [place], [place] [address]     Labs: CBC: Recent Labs  Lab 08/26/22 2153 08/27/22 1415 08/27/22 1425 08/30/22 0750 08/31/22 0709  WBC 7.4 8.7  --  7.9 5.4  NEUTROABS  --  7.3  --   --  2.8  HGB 11.5* 11.2* 11.2* 10.4*  10.5* 8.6*  HCT 35.6* 34.4* 33.0* 32.1*  31.0* 26.8*  MCV 86.6 87.5  --  85.4 87.9  PLT 330 284  --   269 254   Basic Metabolic Panel: Recent Labs  Lab 08/26/22 2153 08/27/22 1415 08/27/22 1425 08/30/22 0750 08/31/22 0036 08/31/22 0709 09/01/22 0430  NA 140 141 141 141  139  --  140 137  K 4.3 3.8 3.8 3.8  4.3  --  3.5 3.7  CL 105 105  --  102  --  108 105  CO2 21* 25  --  27  --  24 22  GLUCOSE 149* 353*  --  287*  --  74 147*  BUN 37* 33*  --  26*  --  25* 33*  CREATININE 3.45* 3.49*  --  3.49*  --  3.62* 4.12*  CALCIUM 9.3 9.1  --  9.1  --  8.4* 7.9*  MG  --   --   --   --  1.9  --   --  Liver Function Tests: Recent Labs  Lab 08/26/22 2153 08/27/22 1415 08/30/22 0750 09/01/22 0430  AST 18 15 10* 10*  ALT $Re'9 13 6 7  'yGB$ ALKPHOS 87 97 85 56  BILITOT 1.4* 1.4* 1.3* 0.6  PROT 7.4 6.9 6.9 5.2*  ALBUMIN 3.5 3.2* 3.6 2.4*   CBG: Recent Labs  Lab 08/31/22 1936 08/31/22 2327 09/01/22 0320 09/01/22 1044 09/01/22 1210  GLUCAP 230* 204* 150* 240* 265*    Discharge time spent: less than 30 minutes.  Signed: Marylu Lund, MD [facility] 09/01/2022

## 2022-09-01 NOTE — Inpatient Diabetes Management (Addendum)
Inpatient Diabetes Program Recommendations  AACE/ADA: New Consensus Statement on Inpatient Glycemic Control (2015)  Target Ranges:  Prepandial:   less than 140 mg/dL      Peak postprandial:   less than 180 mg/dL (1-2 hours)      Critically ill patients:  140 - 180 mg/dL   Lab Results  Component Value Date   GLUCAP 265 (H) 09/01/2022   HGBA1C 6.4 (H) 07/12/2022    Latest Reference Range & Units 08/31/22 19:36 08/31/22 23:27 09/01/22 03:20 09/01/22 10:44 09/01/22 12:10  Glucose-Capillary 70 - 99 mg/dL 230 (H) 204 (H) 150 (H) 240 (H) 265 (H)  (H): Data is abnormally high Review of Glycemic Control  Diabetes history: TYPE 1 Outpatient Diabetes medications: Omnipod insulin pump with Novolog (total basal=24 units) Current orders for Inpatient glycemic control: Levemir 10 units daily, Novolog 0-6 units correction scale every 4 hours  Inpatient Diabetes Program Recommendations:   Noted that blood sugars are now greater than 180 mg/dl.  Recommend increasing Levemir to 12 units daily, and changing Novolog correction scale to 0-9 units TID if eating, add Novolog 0-5 units HS scale. Titrate dosages as needed.   Harvel Ricks RN BSN CDE Diabetes Coordinator Pager: [phone]  8am-5pm

## 2022-09-01 NOTE — Plan of Care (Signed)
  Problem: Fluid Volume: Goal: Ability to maintain a balanced intake and output will improve Outcome: Progressing   Problem: Health Behavior/Discharge Planning: Goal: Ability to identify and utilize available resources and services will improve Outcome: Progressing Goal: Ability to manage health-related needs will improve Outcome: Progressing   Problem: Metabolic: Goal: Ability to maintain appropriate glucose levels will improve Outcome: Progressing   Problem: Tissue Perfusion: Goal: Adequacy of tissue perfusion will improve Outcome: Progressing   Problem: Clinical Measurements: Goal: Will remain free from infection Outcome: Progressing   Problem: Nutrition: Goal: Adequate nutrition will be maintained Outcome: Progressing   Problem: Pain Managment: Goal: General experience of comfort will improve Outcome: Progressing   Problem: Safety: Goal: Ability to remain free from injury will improve Outcome: Progressing

## 2022-09-02 ENCOUNTER — Ambulatory Visit: Payer: BC Managed Care – PPO | Admitting: Physician Assistant

## 2022-09-04 DIAGNOSIS — E113513 Type 2 diabetes mellitus with proliferative diabetic retinopathy with macular edema, bilateral: Secondary | ICD-10-CM | POA: Diagnosis not present

## 2022-09-04 DIAGNOSIS — H2513 Age-related nuclear cataract, bilateral: Secondary | ICD-10-CM | POA: Diagnosis not present

## 2022-09-04 DIAGNOSIS — H43823 Vitreomacular adhesion, bilateral: Secondary | ICD-10-CM | POA: Diagnosis not present

## 2022-09-04 DIAGNOSIS — H35033 Hypertensive retinopathy, bilateral: Secondary | ICD-10-CM | POA: Diagnosis not present

## 2022-09-05 ENCOUNTER — Ambulatory Visit: Payer: BC Managed Care – PPO | Attending: Physician Assistant | Admitting: Internal Medicine

## 2022-09-05 ENCOUNTER — Ambulatory Visit: Payer: BC Managed Care – PPO | Admitting: Nurse Practitioner

## 2022-09-05 ENCOUNTER — Encounter: Payer: Self-pay | Admitting: Internal Medicine

## 2022-09-05 ENCOUNTER — Other Ambulatory Visit (HOSPITAL_COMMUNITY): Payer: Self-pay | Admitting: *Deleted

## 2022-09-05 VITALS — BP 155/90 | HR 99 | Ht 72.0 in | Wt 187.0 lb

## 2022-09-05 DIAGNOSIS — I469 Cardiac arrest, cause unspecified: Secondary | ICD-10-CM

## 2022-09-05 DIAGNOSIS — I2585 Chronic coronary microvascular dysfunction: Secondary | ICD-10-CM | POA: Diagnosis not present

## 2022-09-05 DIAGNOSIS — E782 Mixed hyperlipidemia: Secondary | ICD-10-CM

## 2022-09-05 DIAGNOSIS — I5032 Chronic diastolic (congestive) heart failure: Secondary | ICD-10-CM | POA: Diagnosis not present

## 2022-09-05 DIAGNOSIS — E7801 Familial hypercholesterolemia: Secondary | ICD-10-CM

## 2022-09-05 HISTORY — DX: Mixed hyperlipidemia: E78.2

## 2022-09-05 MED ORDER — PRAVASTATIN SODIUM 40 MG PO TABS: 40.0000 mg | ORAL_TABLET | Freq: Every evening | ORAL | 3 refills | Status: DC

## 2022-09-05 MED ORDER — METOPROLOL SUCCINATE ER 50 MG PO TB24: 50.0000 mg | ORAL_TABLET | Freq: Every day | ORAL | 3 refills | Status: DC

## 2022-09-05 MED ORDER — NITROGLYCERIN 0.4 MG SL SUBL: 0.4000 mg | SUBLINGUAL_TABLET | SUBLINGUAL | 3 refills | Status: AC | PRN

## 2022-09-05 NOTE — Progress Notes (Signed)
Cardiology Office Note:    Date:  09/05/2022   ID:  [name], DOB [birth year], MRN [id number]  PCP:  [name], [place] Providers Cardiologist:  Werner Lean, MD Electrophysiologist:  Melida Quitter, MD     Referring MD: Megan Roles, PA   CC:  Transition to new clinical cardiologist  History of Present Illness:    [name] is a 52 y.o. 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  Has CKD and is pending renal transplant discussions. 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  Patient notes that she is doing alright.   She has issues with intractable nausea and vomiting. There are no interval hospital/ED visit.    Has had limitations since the cardiac arrest. Doesn't have much energy. Is able to walk up a flight of stairs  Gets chest discomfort at rest.  Chest pain occurs as pressure and in the setting of her chest. Ends up in the hospital  Improved with fluids and pain medicine.  Breathing is doing ok.  Takes torsemide.  There is a plan for dialysis teachings and is consider PD vs HD.  No syncope or palpitations.  Her BP is high was able to take one of her hydralazine medication which helped. Her gastroparesis is being managing by [facility] because [facility] didn't feel they could optimally manage her.   Past Medical History:  Diagnosis Date   Anemia    Anxiety    Arthritis    Asthma    CHF (congestive heart failure) ([place])    Depression    Diabetic ulcer of left foot ([place]) 05/12/2013   Gastroparesis    Generalized abdominal pain    History of chicken pox    Loss of weight 12/02/2019   Migraines    Mixed hyperlipidemia 09/05/2022   Mood disorder ([place])    anxiety   Prurigo nodularis    with diabetic dermopathy   Type 1 diabetes, uncontrolled, with neuropathy    [place]   Ulcers of both lower legs ([place]) 02/20/2014     Past Surgical History:  Procedure Laterality Date   BIOPSY  08/11/2020   Procedure: BIOPSY;  Surgeon: Ladene Artist, MD;  Location: [facility];  Service: Endoscopy;;   BIOPSY  04/02/2022   Procedure: BIOPSY;  Surgeon: Jackquline Denmark, MD;  Location: [facility];  Service: Gastroenterology;;   ESOPHAGOGASTRODUODENOSCOPY (EGD) WITH PROPOFOL N/A 08/11/2020   Procedure: ESOPHAGOGASTRODUODENOSCOPY (EGD) WITH PROPOFOL;  Surgeon: Ladene Artist, MD;  Location: [facility];  Service: Endoscopy;  Laterality: N/A;   ESOPHAGOGASTRODUODENOSCOPY (EGD) WITH PROPOFOL N/A 04/02/2022   Procedure: ESOPHAGOGASTRODUODENOSCOPY (EGD) WITH PROPOFOL;  Surgeon: Jackquline Denmark, MD;  Location: [facility];  Service: Gastroenterology;  Laterality: N/A;   ESOPHAGOGASTRODUODENOSCOPY (EGD) WITH PROPOFOL N/A 05/23/2022   Procedure: ESOPHAGOGASTRODUODENOSCOPY (EGD) WITH PROPOFOL;  Surgeon: Lucilla Lame, MD;  Location: [facility] ENDOSCOPY;  Service: Endoscopy;  Laterality: N/A;   ICD IMPLANT N/A 04/09/2022   Procedure: ICD IMPLANT;  Surgeon: Constance Haw, MD;  Location: [facility];  Service: Cardiovascular;  Laterality: N/A;   treadmill stress test  01/2013   WNL, low risk study    Current Medications: Current Meds  Medication Sig   acetaminophen (TYLENOL) 500 MG tablet as needed for moderate pain or mild pain.   albuterol (PROAIR HFA) 108 (90 Base) MCG/ACT inhaler Inhale 2 puffs into the lungs every  6 (six) hours as needed for wheezing or shortness of breath.   Continuous Blood Gluc Sensor (FREESTYLE LIBRE 2 SENSOR) MISC Inject 1 Device into the skin every 14 (fourteen) days.   Ensure Max Protein (ENSURE MAX PROTEIN) LIQD Take 330 mLs (11 oz total) by mouth daily.   hydrALAZINE (APRESOLINE) 50 MG tablet Take 1 tablet (50 mg total) by mouth 3 (three) times daily.   hydrOXYzine (ATARAX) 50 MG tablet Take 50 mg by mouth in the morning, at noon, and at bedtime.   insulin aspart (NOVOLOG) 100 UNIT/ML injection  Inject 150 Units into the skin See admin instructions. Patient uses this with the [place]. Units vary according to blood sugar. Loads 150 units every 3 days.   LORazepam (ATIVAN) 0.5 MG tablet Take 1 tablet (0.5 mg total) by mouth every 8 (eight) hours as needed for anxiety (Refractory nausea and vomiting).   metoCLOPramide (REGLAN) 10 MG tablet Take 1 tablet (10 mg total) by mouth 3 (three) times daily before meals.   metoprolol succinate (TOPROL-XL) 50 MG 24 hr tablet Take 1 tablet (50 mg total) by mouth daily. Take with or immediately following a meal.   Multiple Vitamin (MULTIVITAMIN WITH MINERALS) TABS tablet Take 1 tablet by mouth daily.   nitroGLYCERIN (NITROSTAT) 0.4 MG SL tablet Place 1 tablet (0.4 mg total) under the tongue every 5 (five) minutes as needed for chest pain.   pantoprazole (PROTONIX) 40 MG tablet Take 1 tablet (40 mg total) by mouth 2 (two) times daily before a meal.   polyethylene glycol (MIRALAX / GLYCOLAX) 17 g packet Take 17 g by mouth daily as needed for moderate constipation.   pravastatin (PRAVACHOL) 40 MG tablet Take 1 tablet (40 mg total) by mouth every evening.   pregabalin (LYRICA) 50 MG capsule as needed (neuropathy).   prochlorperazine (COMPAZINE) 10 MG tablet Take 1 tablet (10 mg total) by mouth every 6 (six) hours as needed for nausea or vomiting.   promethazine (PHENERGAN) 25 MG suppository Unwrap and place 1 suppository (25 mg total) rectally every 6 (six) hours as needed for nausea or vomiting.   scopolamine (TRANSDERM-SCOP) 1 MG/3DAYS Place 1 patch (1.5 mg total) onto the skin every 3 (three) days.   tobramycin (TOBREX) 0.3 % ophthalmic solution Place 1-2 drops into both eyes See admin instructions. Instill 1-2 drops into both eyes 3 times daily the day before, day of, and the day after every 6 weeks eye injections per patient   torsemide (DEMADEX) 20 MG tablet TAKE THREE TABLETS BY MOUTH DAILY   [DISCONTINUED] metoprolol succinate (TOPROL XL) 25 MG 24 hr  tablet Take 1 tablet (25 mg total) by mouth daily.   [DISCONTINUED] rosuvastatin (CRESTOR) 20 MG tablet Take 1 tablet (20 mg total) by mouth daily.     Allergies:   Atorvastatin, Dulaglutide, Sertraline, Zofran [ondansetron], Amoxicillin, Lantus [insulin glargine], and Miconazole nitrate   Social History   Socioeconomic History   Marital status: Divorced    Spouse name: Not on file   Number of children: 2   Years of education: Not on file   Highest education level: Not on file  Occupational History   Occupation: Freight forwarder  Tobacco Use   Smoking status: Never   Smokeless tobacco: Never  Vaping Use   Vaping Use: Never used  Substance and Sexual Activity   Alcohol use: Not Currently   Drug use: No   Sexual activity: Yes    Partners: Male    Birth control/protection: None  Other Topics  Concern   Not on file  Social History Narrative   Caffeine use: daily   Occupation: cleans houses   Regular exercise: yes, active 5x/wk   Diet: good water, fruits/vegetables daily   Social Determinants of Health   Financial Resource Strain: Low Risk  (07/15/2022)   Overall Financial Resource Strain (CARDIA)    Difficulty of Paying Living Expenses: Not very hard  Food Insecurity: No Food Insecurity (08/30/2022)   Hunger Vital Sign    Worried About Running Out of Food in the Last Year: Never true    Ran Out of Food in the Last Year: Never true  Transportation Needs: No Transportation Needs (08/30/2022)   PRAPARE - Hydrologist (Medical): No    Lack of Transportation (Non-Medical): No  Physical Activity: Not on file  Stress: Not on file  Social Connections: Not on file     Family History: The patient's family history includes Bipolar disorder in her mother; CAD in her maternal aunt; Calcium disorder in her mother; Cancer in her maternal grandmother, maternal uncle, and paternal grandmother; Cancer (age of onset: 5) in her maternal aunt; Diabetes in her mother,  paternal grandfather, paternal grandmother, and sister; Heart disease in her mother; Heart failure in her sister; Hypertension in her mother; Stroke in her maternal aunt; Thyroid disease in her mother.  ROS:   Please see the history of present illness.     All other systems reviewed and are negative.  EKGs/Labs/Other Studies Reviewed:    The following studies were reviewed today:  EKG:  EKG is  ordered today.  The ekg ordered today demonstrates  09/01/2022: SR with diffuse TWI   Cardiac Studies & Procedures       ECHOCARDIOGRAM  ECHOCARDIOGRAM COMPLETE 04/07/2022  Narrative ECHOCARDIOGRAM REPORT    Patient Name:   JAQUAYA COYLE Date of Exam: 04/07/2022 Medical Rec #:  [id number]      Height:       72.0 in Accession #:    [id number]     Weight:       180.7 lb Date of Birth:  08-27-1970     BSA:          2.041 m Patient Age:    1 years       BP:           111/66 mmHg Patient Gender: F              HR:           76 bpm. Exam Location:  Inpatient  Procedure: 2D Echo, Cardiac Doppler and Color Doppler  Indications:    I46.9 Cardiac Arrest  History:        Patient has prior history of Echocardiogram examinations, most recent 04/05/2022. CHF; Risk Factors:Hypertension and Diabetes.  Sonographer:    Alvino Chapel RCS Referring Phys: [phone] [place]   1. Left ventricular ejection fraction, by estimation, is 50 to 55%. The left ventricle has low normal function. The left ventricle has no regional wall motion abnormalities. There is mild left ventricular hypertrophy. Left ventricular diastolic parameters are consistent with Grade II diastolic dysfunction (pseudonormalization). 2. Right ventricular systolic function is normal. The right ventricular size is normal. There is mildly elevated pulmonary artery systolic pressure. 3. Left atrial size was severely dilated. 4. The mitral valve is normal in structure. No evidence of mitral valve regurgitation. No evidence of  mitral stenosis. 5. The aortic valve is normal in structure. Aortic  valve regurgitation is not visualized. No aortic stenosis is present. 6. The inferior vena cava is dilated in size with >50% respiratory variability, suggesting right atrial pressure of 8 mmHg.  Comparison(s): Prior images reviewed side by side. The left ventricular function has improved.  FINDINGS Left Ventricle: Left ventricular ejection fraction, by estimation, is 50 to 55%. The left ventricle has low normal function. The left ventricle has no regional wall motion abnormalities. The left ventricular internal cavity size was normal in size. There is mild left ventricular hypertrophy. Left ventricular diastolic parameters are consistent with Grade II diastolic dysfunction (pseudonormalization).  Right Ventricle: The right ventricular size is normal. No increase in right ventricular wall thickness. Right ventricular systolic function is normal. There is mildly elevated pulmonary artery systolic pressure. The tricuspid regurgitant velocity is 2.37 m/s, and with an assumed right atrial pressure of 15 mmHg, the estimated right ventricular systolic pressure is 50.0 mmHg.  Left Atrium: Left atrial size was severely dilated.  Right Atrium: Right atrial size was normal in size.  Pericardium: There is no evidence of pericardial effusion.  Mitral Valve: The mitral valve is normal in structure. No evidence of mitral valve regurgitation. No evidence of mitral valve stenosis.  Tricuspid Valve: The tricuspid valve is normal in structure. Tricuspid valve regurgitation is trivial. No evidence of tricuspid stenosis.  Aortic Valve: The aortic valve is normal in structure. Aortic valve regurgitation is not visualized. No aortic stenosis is present.  Pulmonic Valve: The pulmonic valve was normal in structure. Pulmonic valve regurgitation is trivial. No evidence of pulmonic stenosis.  Aorta: The aortic root is normal in size and  structure.  Venous: The inferior vena cava is dilated in size with greater than 50% respiratory variability, suggesting right atrial pressure of 8 mmHg.  IAS/Shunts: No atrial level shunt detected by color flow Doppler.   LEFT VENTRICLE PLAX 2D LVIDd:         5.10 cm   Diastology LVIDs:         3.10 cm   LV e' medial:    6.85 cm/s LV PW:         1.20 cm   LV E/e' medial:  12.8 LV IVS:        0.90 cm   LV e' lateral:   8.05 cm/s LVOT diam:     2.00 cm   LV E/e' lateral: 10.9 LV SV:         68 LV SV Index:   34 LVOT Area:     3.14 cm   RIGHT VENTRICLE RV S prime:     13.80 cm/s TAPSE (M-mode): 2.8 cm  LEFT ATRIUM              Index        RIGHT ATRIUM           Index LA diam:        4.80 cm  2.35 cm/m   RA Area:     18.20 cm LA Vol (A2C):   108.0 ml 52.92 ml/m  RA Volume:   46.30 ml  22.69 ml/m LA Vol (A4C):   108.0 ml 52.92 ml/m LA Biplane Vol: 107.0 ml 52.43 ml/m AORTIC VALVE LVOT Vmax:   113.00 cm/s LVOT Vmean:  78.800 cm/s LVOT VTI:    0.218 m  AORTA Ao Root diam: 3.00 cm  MITRAL VALVE               TRICUSPID VALVE MV Area (PHT): 4.49 cm  TR Peak grad:   22.5 mmHg MV Decel Time: 169 msec    TR Vmax:        237.00 cm/s MV E velocity: 87.40 cm/s MV A velocity: 34.60 cm/s  SHUNTS MV E/A ratio:  2.53        Systemic VTI:  0.22 m Systemic Diam: 2.00 cm  Candee Furbish MD Electronically signed by Candee Furbish MD Signature Date/Time: 04/07/2022/2:13:57 PM    Final     CT SCANS  CT CORONARY MORPH W/CTA COR W/SCORE 01/19/2020  Addendum 01/19/2020  3:10 PM ADDENDUM REPORT: 01/19/2020 15:08  EXAM: OVER-READ INTERPRETATION  CT CHEST  The following report is an over-read performed by radiologist Dr. Eben Burow [facility] on 01/19/2020. This over-read does not include interpretation of cardiac or coronary anatomy or pathology. The coronary calcium score/coronary CTA interpretation by the cardiologist is attached.  COMPARISON:   None.  FINDINGS: The visualized portions of the lower lung fields show no suspicious nodules, masses, or infiltrates. Small bilateral pleural effusions are seen.  The visualized portions of the mediastinum and chest wall are unremarkable.  IMPRESSION: Small bilateral pleural effusions. No other significant non-cardiovascular abnormality seen in visualized portion of the thorax.   Electronically Signed By: [name] M.D. On: 01/19/2020 15:08  Narrative CLINICAL DATA:  Shortness of breath/Normal stress test/Rule out balanced ischemia  EXAM: Cardiac/Coronary CTA  TECHNIQUE: The patient was scanned on a [employer]. A 100 kV retrospective scan was triggered in the descending thoracic aorta at 111 HU's. Axial non-contrast 3 mm slices were carried out through the heart. The data set was analyzed on a dedicated work station and scored using the [place]. Gantry rotation speed was 250 msecs and collimation was .6 mm. 5 mg IV diltiazem and 0.8 mg of sl NTG was given. The 3D data set was reconstructed in 5% intervals of the 35-75 % of the R-R cycle. Diastolic phases were analyzed on a dedicated work station using MPR, MIP and VRT modes. The patient received 80 cc of contrast.  FINDINGS: Image quality: excellent.  Noise artifact is: Limited.  Coronary Arteries:  Normal coronary origin.  Right dominance.  Left main: The left main is a large caliber vessel with a normal take off from the left coronary cusp that bifurcates to form a left anterior descending artery and a left circumflex artery. There is no plaque or stenosis.  Left anterior descending artery: The LAD is patent without evidence of plaque or stenosis. The LAD gives off 1 large patent diagonal branch.  Left circumflex artery: The LCX is non-dominant and patent with no evidence of plaque or stenosis. The LCX gives off 1 patent obtuse marginal branch.  Right coronary artery: The RCA is  dominant with normal take off from the right coronary cusp. There is no evidence of plaque or stenosis. The RCA terminates as a PDA and right posterolateral branch without evidence of plaque or stenosis.  Right Atrium: Right atrial size is severely dilated.  Right Ventricle: The right ventricular cavity is within normal limits.  Left Atrium: Left atrial size is severely dilated with no left atrial appendage filling defect.  Left Ventricle: The ventricular cavity size is within normal limits. There are no stigmata of prior infarction. There is no abnormal filling defect.  Pulmonary arteries: Normal size without proximal filling defect.  Pulmonary veins: Normal pulmonary venous drainage.  Pericardium: Normal thickness with no significant effusion or calcium present.  Cardiac valves: The aortic valve is trileaflet  without significant calcification. The mitral valve is normal structure without significant calcification.  Aorta: Normal caliber with no significant disease.  Extra-cardiac findings: See attached radiology report for non-cardiac structures.  IMPRESSION: 1. Coronary calcium score of 0.  2. Normal coronary origin with right dominance.  3. Normal coronary arteries.  4. Severe biatrial enlargement.  Eleonore Chiquito, MD  Electronically Signed: By: Eleonore Chiquito On: 01/19/2020 12:56            Recent Labs: 07/22/2022: TSH 2.439 08/31/2022: B Natriuretic Peptide 1,103.7; Hemoglobin 8.6; Magnesium 1.9; Platelets 225 09/01/2022: ALT 7; BUN 33; Creatinine, Ser 4.12; Potassium 3.7; Sodium 137  Recent Lipid Panel    Component Value Date/Time   CHOL 205 (H) 01/31/2020 1008   TRIG 70 01/31/2020 1008   HDL 73 01/31/2020 1008   CHOLHDL 2.9 12/03/2019 0952   CHOLHDL 5.2 10/12/2019 0834   VLDL 16 10/12/2019 0834   LDLCALC 119 (H) 01/31/2020 1008   LDLDIRECT 104.1 04/12/2014 1608     Risk Assessment/Calculations:    HYPERTENSION CONTROL Vitals:   09/05/22  1452 09/05/22 1614  BP: (!) 152/90 (!) 155/90    The patient's blood pressure is elevated above target today.  In order to address the patient's elevated BP: A current anti-hypertensive medication was adjusted today.           Physical Exam:    VS:  BP (!) 155/90   Pulse 99   Ht 6' (1.829 m)   Wt 187 lb (84.8 kg)   LMP 11/17/2021 (Approximate)   SpO2 98%   BMI 25.36 kg/m     Wt Readings from Last 3 Encounters:  09/05/22 187 lb (84.8 kg)  08/30/22 178 lb (80.7 kg)  08/28/22 177 lb 6.4 oz (80.5 kg)     GEN: NAD HEENT: Normal CARDIAC: RRR, no murmurs, rubs, gallops RESPIRATORY:  Clear to auscultation without rales, wheezing or rhonchi  ABDOMEN: Soft, non-tender, non-distended MUSCULOSKELETAL:  No edema; No deformity  SKIN: Warm and dry NEUROLOGIC:  Alert and oriented x 3 PSYCHIATRIC:  Normal affect   ASSESSMENT:    1. Cardiac microvascular disease   2. Mixed hyperlipidemia   3. Chronic diastolic CHF (congestive heart failure) (HCC)   4. Cardiac arrest ([place])    PLAN:    Coronary microvascular disease Type I D and gastroparesis - no epicardial disease (CAC 0) on 2021 stress test - will start PRN Nitro; if helping can increase to Imdur 30 mg  - no ASA given GI bleed history - when she is on HD we will get a PET MPI test to confirm - request lipid labs from Dr. Jaquelyn Bitter given her [place]; she cannot tolerate rosuvastatin 20 mg due to dizziness- decrease to pravastatin 40 mg  HF recovered EF - NYHA I Stage C complicated by SCD s/p ICD  - sees EP With CKD stage V and HTN - pending HD - euvolemic on current torsemide - continue hydralazine; due to her gastroparesis she cannot keep many of her pills down - she can swallow the metoprolol succinate, given this and the above increase to 50 mg.   Three-four months f/u me or APP  Time Spent Directly with Patient:   I have spent a total of 40 minutes with the patient reviewing notes, imaging, EKGs, labs and examining  the patient as well as establishing an assessment and plan that was discussed personally with the patient.  > 50% of time was spent in direct patient care.  Shared Decision Making/Informed Consent The risks [chest pain, shortness of breath, cardiac arrhythmias, dizziness, blood pressure fluctuations, myocardial infarction, stroke/transient ischemic attack, nausea, vomiting, allergic reaction, radiation exposure, metallic taste sensation and life-threatening complications (estimated to be 1 in 10,000)], benefits (risk stratification, diagnosing coronary artery disease, treatment guidance) and alternatives of a cardiac PET stress test were discussed in detail with [name] and she agrees to proceed.    Medication Adjustments/Labs and Tests Ordered: Current medicines are reviewed at length with the patient today.  Concerns regarding medicines are outlined above.  Orders Placed This Encounter  Procedures   ALT   Lipid panel   Meds ordered this encounter  Medications   nitroGLYCERIN (NITROSTAT) 0.4 MG SL tablet    Sig: Place 1 tablet (0.4 mg total) under the tongue every 5 (five) minutes as needed for chest pain.    Dispense:  90 tablet    Refill:  3   metoprolol succinate (TOPROL-XL) 50 MG 24 hr tablet    Sig: Take 1 tablet (50 mg total) by mouth daily. Take with or immediately following a meal.    Dispense:  90 tablet    Refill:  3   pravastatin (PRAVACHOL) 40 MG tablet    Sig: Take 1 tablet (40 mg total) by mouth every evening.    Dispense:  90 tablet    Refill:  3    Patient Instructions  Medication Instructions:  Your physician has recommended you make the following change in your medication:  INCREASE: metoprolol succinate (Toprol-XL) to 50 mg by mouth once daily  STOP: rosuvastatin (Crestor) START: pravastatin 40 mg by mouth once daily at dinner time  START: Nitroglycerin 0.4 mg under your tongue every 5 min as needed for Chest Pain   If you have chest pain place 1  tablet under your tongue, wait 5 min If chest pain continues place a 2nd tablet under your tongue, wait 5 min If chest pain continues place a 3rd tablet under your tongue, wait 5 min If chest pain continues call 911  *If you need a refill on your cardiac medications before your next appointment, please call your pharmacy*   Lab Work: IN 3 MONTHS: FLP, ALT ( nothing to eat or drink 8-12 hours prior except water and black coffee)  If you have labs (blood work) drawn today and your tests are completely normal, you will receive your results only by: [place] (if you have MyChart) OR A paper copy in the mail If you have any lab test that is abnormal or we need to change your treatment, we will call you to review the results.   Testing/Procedures: NONE   Follow-Up: At [facility], you and your health needs are our priority.  As part of our continuing mission to provide you with exceptional heart care, we have created designated Provider Care Teams.  These Care Teams include your primary Cardiologist (physician) and Advanced Practice Providers (APPs -  Physician Assistants and Nurse Practitioners) who all work together to provide you with the care you need, when you need it.  We recommend signing up for the patient portal called "MyChart".  Sign up information is provided on this After Visit Summary.  MyChart is used to connect with patients for Virtual Visits (Telemedicine).  Patients are able to view lab/test results, encounter notes, upcoming appointments, etc.  Non-urgent messages can be sent to your provider as well.   To learn more about what you can do with MyChart, go to  [email or web address].    Your next appointment:   3 month(s)  Provider:   Werner Lean, MD       Signed, Werner Lean, MD  09/05/2022 4:15 PM    [place]

## 2022-09-05 NOTE — Patient Instructions (Addendum)
Medication Instructions:  Your physician has recommended you make the following change in your medication:  INCREASE: metoprolol succinate (Toprol-XL) to 50 mg by mouth once daily  STOP: rosuvastatin (Crestor) START: pravastatin 40 mg by mouth once daily at dinner time  START: Nitroglycerin 0.4 mg under your tongue every 5 min as needed for Chest Pain   If you have chest pain place 1 tablet under your tongue, wait 5 min If chest pain continues place a 2nd tablet under your tongue, wait 5 min If chest pain continues place a 3rd tablet under your tongue, wait 5 min If chest pain continues call 911  *If you need a refill on your cardiac medications before your next appointment, please call your pharmacy*   Lab Work: IN 3 MONTHS: FLP, ALT ( nothing to eat or drink 8-12 hours prior except water and black coffee)  If you have labs (blood work) drawn today and your tests are completely normal, you will receive your results only by: [place] (if you have MyChart) OR A paper copy in the mail If you have any lab test that is abnormal or we need to change your treatment, we will call you to review the results.   Testing/Procedures: NONE   Follow-Up: At [facility], you and your health needs are our priority.  As part of our continuing mission to provide you with exceptional heart care, we have created designated Provider Care Teams.  These Care Teams include your primary Cardiologist (physician) and Advanced Practice Providers (APPs -  Physician Assistants and Nurse Practitioners) who all work together to provide you with the care you need, when you need it.  We recommend signing up for the patient portal called "MyChart".  Sign up information is provided on this After Visit Summary.  MyChart is used to connect with patients for Virtual Visits (Telemedicine).  Patients are able to view lab/test results, encounter notes, upcoming appointments, etc.  Non-urgent messages can be  sent to your provider as well.   To learn more about what you can do with MyChart, go to [email or web address].    Your next appointment:   3 month(s)  Provider:   Werner Lean, MD

## 2022-09-06 ENCOUNTER — Encounter (HOSPITAL_COMMUNITY)
Admission: RE | Admit: 2022-09-06 | Discharge: 2022-09-06 | Disposition: A | Discharge status: Home / Self Care | Payer: BC Managed Care – PPO | Source: Ambulatory Visit | Attending: Nephrology | Admitting: Nephrology

## 2022-09-06 VITALS — BP 143/83 | HR 99 | Temp 98.3°F | Resp 17

## 2022-09-06 DIAGNOSIS — F32A Depression, unspecified: Secondary | ICD-10-CM | POA: Diagnosis not present

## 2022-09-06 DIAGNOSIS — R739 Hyperglycemia, unspecified: Secondary | ICD-10-CM | POA: Diagnosis not present

## 2022-09-06 DIAGNOSIS — E1043 Type 1 diabetes mellitus with diabetic autonomic (poly)neuropathy: Secondary | ICD-10-CM | POA: Diagnosis not present

## 2022-09-06 DIAGNOSIS — Z823 Family history of stroke: Secondary | ICD-10-CM | POA: Diagnosis not present

## 2022-09-06 DIAGNOSIS — N184 Chronic kidney disease, stage 4 (severe): Secondary | ICD-10-CM | POA: Diagnosis not present

## 2022-09-06 DIAGNOSIS — I2510 Atherosclerotic heart disease of native coronary artery without angina pectoris: Secondary | ICD-10-CM | POA: Diagnosis not present

## 2022-09-06 DIAGNOSIS — E1040 Type 1 diabetes mellitus with diabetic neuropathy, unspecified: Secondary | ICD-10-CM | POA: Diagnosis not present

## 2022-09-06 DIAGNOSIS — Z880 Allergy status to penicillin: Secondary | ICD-10-CM | POA: Diagnosis not present

## 2022-09-06 DIAGNOSIS — I10 Essential (primary) hypertension: Secondary | ICD-10-CM | POA: Insufficient documentation

## 2022-09-06 DIAGNOSIS — I5032 Chronic diastolic (congestive) heart failure: Secondary | ICD-10-CM | POA: Diagnosis not present

## 2022-09-06 DIAGNOSIS — E1143 Type 2 diabetes mellitus with diabetic autonomic (poly)neuropathy: Secondary | ICD-10-CM | POA: Diagnosis not present

## 2022-09-06 DIAGNOSIS — Z79899 Other long term (current) drug therapy: Secondary | ICD-10-CM | POA: Diagnosis not present

## 2022-09-06 DIAGNOSIS — E782 Mixed hyperlipidemia: Secondary | ICD-10-CM | POA: Diagnosis not present

## 2022-09-06 DIAGNOSIS — Z95810 Presence of automatic (implantable) cardiac defibrillator: Secondary | ICD-10-CM | POA: Diagnosis not present

## 2022-09-06 DIAGNOSIS — E1022 Type 1 diabetes mellitus with diabetic chronic kidney disease: Secondary | ICD-10-CM | POA: Diagnosis not present

## 2022-09-06 DIAGNOSIS — F419 Anxiety disorder, unspecified: Secondary | ICD-10-CM | POA: Diagnosis present

## 2022-09-06 DIAGNOSIS — Z8674 Personal history of sudden cardiac arrest: Secondary | ICD-10-CM | POA: Diagnosis not present

## 2022-09-06 DIAGNOSIS — R110 Nausea: Secondary | ICD-10-CM | POA: Diagnosis not present

## 2022-09-06 DIAGNOSIS — Z833 Family history of diabetes mellitus: Secondary | ICD-10-CM | POA: Diagnosis not present

## 2022-09-06 DIAGNOSIS — D631 Anemia in chronic kidney disease: Secondary | ICD-10-CM | POA: Diagnosis not present

## 2022-09-06 DIAGNOSIS — R112 Nausea with vomiting, unspecified: Secondary | ICD-10-CM | POA: Diagnosis not present

## 2022-09-06 DIAGNOSIS — Z888 Allergy status to other drugs, medicaments and biological substances status: Secondary | ICD-10-CM | POA: Diagnosis not present

## 2022-09-06 DIAGNOSIS — K3184 Gastroparesis: Secondary | ICD-10-CM | POA: Diagnosis not present

## 2022-09-06 DIAGNOSIS — Z8349 Family history of other endocrine, nutritional and metabolic diseases: Secondary | ICD-10-CM | POA: Diagnosis not present

## 2022-09-06 DIAGNOSIS — R000 Tachycardia, unspecified: Secondary | ICD-10-CM | POA: Diagnosis not present

## 2022-09-06 DIAGNOSIS — E872 Acidosis, unspecified: Secondary | ICD-10-CM | POA: Diagnosis not present

## 2022-09-06 DIAGNOSIS — R1111 Vomiting without nausea: Secondary | ICD-10-CM | POA: Diagnosis not present

## 2022-09-06 DIAGNOSIS — I472 Ventricular tachycardia, unspecified: Secondary | ICD-10-CM | POA: Insufficient documentation

## 2022-09-06 DIAGNOSIS — I130 Hypertensive heart and chronic kidney disease with heart failure and stage 1 through stage 4 chronic kidney disease, or unspecified chronic kidney disease: Secondary | ICD-10-CM | POA: Diagnosis not present

## 2022-09-06 DIAGNOSIS — Z8249 Family history of ischemic heart disease and other diseases of the circulatory system: Secondary | ICD-10-CM | POA: Diagnosis not present

## 2022-09-06 DIAGNOSIS — Z794 Long term (current) use of insulin: Secondary | ICD-10-CM | POA: Diagnosis not present

## 2022-09-06 DIAGNOSIS — Z9641 Presence of insulin pump (external) (internal): Secondary | ICD-10-CM | POA: Diagnosis not present

## 2022-09-06 LAB — POCT HEMOGLOBIN-HEMACUE: Hemoglobin: 9 g/dL — ABNORMAL LOW (ref 12.0–15.0)

## 2022-09-06 MED ORDER — EPOETIN ALFA-EPBX 10000 UNIT/ML IJ SOLN: 10000.0000 [IU] | INTRAMUSCULAR | Status: DC

## 2022-09-06 MED ORDER — SODIUM CHLORIDE 0.9 % IV SOLN
510.0000 mg | INTRAVENOUS | Status: DC
  Administered 2022-09-06: 510 mg via INTRAVENOUS
  Filled 2022-09-06: qty 510

## 2022-09-06 MED ORDER — EPOETIN ALFA-EPBX 10000 UNIT/ML IJ SOLN
INTRAMUSCULAR | Status: AC
  Administered 2022-09-06: 10000 [IU] via SUBCUTANEOUS
  Filled 2022-09-06: qty 1

## 2022-09-08 ENCOUNTER — Inpatient Hospital Stay (HOSPITAL_COMMUNITY)
Admission: EM | Admit: 2022-09-08 | Discharge: 2022-09-10 | DRG: 074 | Disposition: A | Discharge status: Home / Self Care | Payer: BC Managed Care – PPO | Attending: Internal Medicine | Admitting: Internal Medicine

## 2022-09-08 ENCOUNTER — Encounter (HOSPITAL_COMMUNITY): Payer: Self-pay

## 2022-09-08 DIAGNOSIS — E1043 Type 1 diabetes mellitus with diabetic autonomic (poly)neuropathy: Principal | ICD-10-CM | POA: Diagnosis present

## 2022-09-08 DIAGNOSIS — E1022 Type 1 diabetes mellitus with diabetic chronic kidney disease: Secondary | ICD-10-CM | POA: Diagnosis present

## 2022-09-08 DIAGNOSIS — E872 Acidosis, unspecified: Secondary | ICD-10-CM

## 2022-09-08 DIAGNOSIS — Z9641 Presence of insulin pump (external) (internal): Secondary | ICD-10-CM | POA: Diagnosis present

## 2022-09-08 DIAGNOSIS — Z79899 Other long term (current) drug therapy: Secondary | ICD-10-CM

## 2022-09-08 DIAGNOSIS — Z8349 Family history of other endocrine, nutritional and metabolic diseases: Secondary | ICD-10-CM

## 2022-09-08 DIAGNOSIS — Z8249 Family history of ischemic heart disease and other diseases of the circulatory system: Secondary | ICD-10-CM | POA: Diagnosis not present

## 2022-09-08 DIAGNOSIS — Z880 Allergy status to penicillin: Secondary | ICD-10-CM | POA: Diagnosis not present

## 2022-09-08 DIAGNOSIS — E1040 Type 1 diabetes mellitus with diabetic neuropathy, unspecified: Secondary | ICD-10-CM | POA: Diagnosis not present

## 2022-09-08 DIAGNOSIS — F419 Anxiety disorder, unspecified: Secondary | ICD-10-CM | POA: Diagnosis present

## 2022-09-08 DIAGNOSIS — I10 Essential (primary) hypertension: Secondary | ICD-10-CM | POA: Diagnosis present

## 2022-09-08 DIAGNOSIS — K3184 Gastroparesis: Secondary | ICD-10-CM | POA: Diagnosis present

## 2022-09-08 DIAGNOSIS — Z794 Long term (current) use of insulin: Secondary | ICD-10-CM | POA: Diagnosis not present

## 2022-09-08 DIAGNOSIS — Z888 Allergy status to other drugs, medicaments and biological substances status: Secondary | ICD-10-CM | POA: Diagnosis not present

## 2022-09-08 DIAGNOSIS — Z95810 Presence of automatic (implantable) cardiac defibrillator: Secondary | ICD-10-CM | POA: Diagnosis not present

## 2022-09-08 DIAGNOSIS — I5032 Chronic diastolic (congestive) heart failure: Secondary | ICD-10-CM | POA: Diagnosis not present

## 2022-09-08 DIAGNOSIS — N186 End stage renal disease: Secondary | ICD-10-CM | POA: Diagnosis present

## 2022-09-08 DIAGNOSIS — Z8674 Personal history of sudden cardiac arrest: Secondary | ICD-10-CM | POA: Diagnosis not present

## 2022-09-08 DIAGNOSIS — F32A Depression, unspecified: Secondary | ICD-10-CM | POA: Diagnosis present

## 2022-09-08 DIAGNOSIS — R112 Nausea with vomiting, unspecified: Secondary | ICD-10-CM | POA: Diagnosis present

## 2022-09-08 DIAGNOSIS — N184 Chronic kidney disease, stage 4 (severe): Secondary | ICD-10-CM | POA: Diagnosis present

## 2022-09-08 DIAGNOSIS — D631 Anemia in chronic kidney disease: Secondary | ICD-10-CM | POA: Diagnosis present

## 2022-09-08 DIAGNOSIS — D638 Anemia in other chronic diseases classified elsewhere: Secondary | ICD-10-CM | POA: Diagnosis present

## 2022-09-08 DIAGNOSIS — I130 Hypertensive heart and chronic kidney disease with heart failure and stage 1 through stage 4 chronic kidney disease, or unspecified chronic kidney disease: Secondary | ICD-10-CM | POA: Diagnosis present

## 2022-09-08 DIAGNOSIS — E782 Mixed hyperlipidemia: Secondary | ICD-10-CM | POA: Diagnosis present

## 2022-09-08 DIAGNOSIS — Z823 Family history of stroke: Secondary | ICD-10-CM

## 2022-09-08 DIAGNOSIS — I2510 Atherosclerotic heart disease of native coronary artery without angina pectoris: Secondary | ICD-10-CM | POA: Insufficient documentation

## 2022-09-08 DIAGNOSIS — E1069 Type 1 diabetes mellitus with other specified complication: Secondary | ICD-10-CM | POA: Diagnosis present

## 2022-09-08 DIAGNOSIS — Z833 Family history of diabetes mellitus: Secondary | ICD-10-CM

## 2022-09-08 LAB — CBC WITH DIFFERENTIAL/PLATELET
Abs Immature Granulocytes: 0.06 10*3/uL (ref 0.00–0.07)
Basophils Absolute: 0 10*3/uL (ref 0.0–0.1)
Basophils Relative: 0 %
Eosinophils Absolute: 0 10*3/uL (ref 0.0–0.5)
Eosinophils Relative: 0 %
HCT: 26 % — ABNORMAL LOW (ref 36.0–46.0)
Hemoglobin: 8.6 g/dL — ABNORMAL LOW (ref 12.0–15.0)
Immature Granulocytes: 1 %
Lymphocytes Relative: 8 %
Lymphs Abs: 0.6 10*3/uL — ABNORMAL LOW (ref 0.7–4.0)
MCH: 28.8 pg (ref 26.0–34.0)
MCHC: 33.1 g/dL (ref 30.0–36.0)
MCV: 87 fL (ref 80.0–100.0)
Monocytes Absolute: 0.3 10*3/uL (ref 0.1–1.0)
Monocytes Relative: 3 %
Neutro Abs: 7.3 10*3/uL (ref 1.7–7.7)
Neutrophils Relative %: 88 %
Platelets: 237 10*3/uL (ref 150–400)
RBC: 2.99 MIL/uL — ABNORMAL LOW (ref 3.87–5.11)
RDW: 14.3 % (ref 11.5–15.5)
WBC: 8.3 10*3/uL (ref 4.0–10.5)
nRBC: 0 % (ref 0.0–0.2)

## 2022-09-08 LAB — COMPREHENSIVE METABOLIC PANEL
ALT: 16 U/L (ref 0–44)
AST: 14 U/L — ABNORMAL LOW (ref 15–41)
Albumin: 2.7 g/dL — ABNORMAL LOW (ref 3.5–5.0)
Alkaline Phosphatase: 76 U/L (ref 38–126)
Anion gap: 11 (ref 5–15)
BUN: 40 mg/dL — ABNORMAL HIGH (ref 6–20)
CO2: 19 mmol/L — ABNORMAL LOW (ref 22–32)
Calcium: 8.5 mg/dL — ABNORMAL LOW (ref 8.9–10.3)
Chloride: 106 mmol/L (ref 98–111)
Creatinine, Ser: 3.18 mg/dL — ABNORMAL HIGH (ref 0.44–1.00)
GFR, Estimated: 17 mL/min — ABNORMAL LOW (ref >60)
Glucose, Bld: 453 mg/dL — ABNORMAL HIGH (ref 70–99)
Potassium: 3.9 mmol/L (ref 3.5–5.1)
Sodium: 136 mmol/L (ref 135–145)
Total Bilirubin: 1.2 mg/dL (ref 0.3–1.2)
Total Protein: 5.9 g/dL — ABNORMAL LOW (ref 6.5–8.1)

## 2022-09-08 LAB — LIPASE, BLOOD: Lipase: 27 U/L (ref 11–51)

## 2022-09-08 LAB — CBG MONITORING, ED
Glucose-Capillary: 438 mg/dL — ABNORMAL HIGH (ref 70–99)
Glucose-Capillary: 434 mg/dL — ABNORMAL HIGH (ref 70–99)
Glucose-Capillary: 252 mg/dL — ABNORMAL HIGH (ref 70–99)

## 2022-09-08 LAB — GLUCOSE, CAPILLARY
Glucose-Capillary: 264 mg/dL — ABNORMAL HIGH (ref 70–99)
Glucose-Capillary: 166 mg/dL — ABNORMAL HIGH (ref 70–99)
Glucose-Capillary: 236 mg/dL — ABNORMAL HIGH (ref 70–99)

## 2022-09-08 LAB — MAGNESIUM: Magnesium: 1.8 mg/dL (ref 1.7–2.4)

## 2022-09-08 MED ORDER — LACTATED RINGERS IV BOLUS
1000.0000 mL | Freq: Once | INTRAVENOUS | Status: AC
  Administered 2022-09-08: 1000 mL via INTRAVENOUS

## 2022-09-08 MED ORDER — INSULIN ASPART 100 UNIT/ML IJ SOLN
15.0000 [IU] | Freq: Once | INTRAMUSCULAR | Status: AC
  Administered 2022-09-08: 15 [IU] via INTRAVENOUS

## 2022-09-08 MED ORDER — HYDRALAZINE HCL 50 MG PO TABS
50.0000 mg | ORAL_TABLET | Freq: Three times a day (TID) | ORAL | Status: DC
  Administered 2022-09-08 – 2022-09-10 (×6): 50 mg via ORAL
  Filled 2022-09-08 (×6): qty 1

## 2022-09-08 MED ORDER — POLYETHYLENE GLYCOL 3350 17 G PO PACK: 17.0000 g | PACK | Freq: Every day | ORAL | Status: DC | PRN

## 2022-09-08 MED ORDER — ACETAMINOPHEN 500 MG PO TABS: 500.0000 mg | ORAL_TABLET | ORAL | Status: DC | PRN

## 2022-09-08 MED ORDER — PROCHLORPERAZINE EDISYLATE 10 MG/2ML IJ SOLN: 10.0000 mg | Freq: Four times a day (QID) | INTRAMUSCULAR | Status: DC | PRN

## 2022-09-08 MED ORDER — HEPARIN SODIUM (PORCINE) 5000 UNIT/ML IJ SOLN
5000.0000 [IU] | Freq: Three times a day (TID) | INTRAMUSCULAR | Status: DC
  Administered 2022-09-08 – 2022-09-10 (×6): 5000 [IU] via SUBCUTANEOUS
  Filled 2022-09-08 (×6): qty 1

## 2022-09-08 MED ORDER — LABETALOL HCL 5 MG/ML IV SOLN: 10.0000 mg | INTRAVENOUS | Status: DC | PRN

## 2022-09-08 MED ORDER — DIPHENHYDRAMINE HCL 50 MG/ML IJ SOLN
25.0000 mg | Freq: Once | INTRAMUSCULAR | Status: AC
  Administered 2022-09-08: 25 mg via INTRAVENOUS
  Filled 2022-09-08: qty 1

## 2022-09-08 MED ORDER — PREGABALIN 25 MG PO CAPS: 50.0000 mg | ORAL_CAPSULE | Freq: Three times a day (TID) | ORAL | Status: DC | PRN

## 2022-09-08 MED ORDER — PANTOPRAZOLE SODIUM 40 MG PO TBEC
40.0000 mg | DELAYED_RELEASE_TABLET | Freq: Two times a day (BID) | ORAL | Status: DC
  Administered 2022-09-08 – 2022-09-10 (×4): 40 mg via ORAL
  Filled 2022-09-08 (×4): qty 1

## 2022-09-08 MED ORDER — DROPERIDOL 2.5 MG/ML IJ SOLN
1.2500 mg | Freq: Once | INTRAMUSCULAR | Status: AC
  Administered 2022-09-08: 1.25 mg via INTRAVENOUS
  Filled 2022-09-08: qty 2

## 2022-09-08 MED ORDER — SODIUM CHLORIDE 0.9 % IV SOLN: 12.5000 mg | Freq: Four times a day (QID) | INTRAVENOUS | Status: DC | PRN

## 2022-09-08 MED ORDER — SCOPOLAMINE 1 MG/3DAYS TD PT72
1.0000 | MEDICATED_PATCH | TRANSDERMAL | Status: DC
  Administered 2022-09-08: 1.5 mg via TRANSDERMAL
  Filled 2022-09-08 (×2): qty 1

## 2022-09-08 MED ORDER — METOCLOPRAMIDE HCL 5 MG/ML IJ SOLN
10.0000 mg | Freq: Once | INTRAMUSCULAR | Status: AC
  Administered 2022-09-08: 10 mg via INTRAVENOUS
  Filled 2022-09-08: qty 2

## 2022-09-08 MED ORDER — METOPROLOL SUCCINATE ER 50 MG PO TB24
50.0000 mg | ORAL_TABLET | Freq: Every day | ORAL | Status: DC
  Administered 2022-09-08 – 2022-09-10 (×3): 50 mg via ORAL
  Filled 2022-09-08 (×3): qty 1

## 2022-09-08 MED ORDER — MORPHINE SULFATE (PF) 2 MG/ML IV SOLN: 2.0000 mg | INTRAVENOUS | Status: DC | PRN

## 2022-09-08 MED ORDER — METOCLOPRAMIDE HCL 5 MG/ML IJ SOLN
10.0000 mg | Freq: Four times a day (QID) | INTRAMUSCULAR | Status: DC
  Administered 2022-09-08 – 2022-09-10 (×9): 10 mg via INTRAVENOUS
  Filled 2022-09-08 (×9): qty 2

## 2022-09-08 MED ORDER — LORAZEPAM 1 MG PO TABS
1.0000 mg | ORAL_TABLET | Freq: Three times a day (TID) | ORAL | Status: DC
  Administered 2022-09-08 – 2022-09-10 (×6): 1 mg via ORAL
  Filled 2022-09-08 (×6): qty 1

## 2022-09-08 MED ORDER — SODIUM CHLORIDE 0.9% FLUSH
3.0000 mL | Freq: Two times a day (BID) | INTRAVENOUS | Status: DC
  Administered 2022-09-08 – 2022-09-10 (×3): 3 mL via INTRAVENOUS

## 2022-09-08 MED ORDER — HYDRALAZINE HCL 20 MG/ML IJ SOLN: 10.0000 mg | INTRAMUSCULAR | Status: DC | PRN

## 2022-09-08 MED ORDER — LACTATED RINGERS IV SOLN: INTRAVENOUS | Status: DC

## 2022-09-08 MED ORDER — NITROGLYCERIN 0.4 MG SL SUBL: 0.4000 mg | SUBLINGUAL_TABLET | SUBLINGUAL | Status: DC | PRN

## 2022-09-08 MED ORDER — ALBUTEROL SULFATE (2.5 MG/3ML) 0.083% IN NEBU: 3.0000 mL | INHALATION_SOLUTION | Freq: Four times a day (QID) | RESPIRATORY_TRACT | Status: DC | PRN

## 2022-09-08 MED ORDER — HYDROXYZINE HCL 25 MG PO TABS
50.0000 mg | ORAL_TABLET | Freq: Three times a day (TID) | ORAL | Status: DC | PRN
  Administered 2022-09-08: 50 mg via ORAL
  Filled 2022-09-08: qty 2

## 2022-09-08 MED ORDER — INSULIN ASPART 100 UNIT/ML IJ SOLN
0.0000 [IU] | INTRAMUSCULAR | Status: DC
  Administered 2022-09-08: 3 [IU] via SUBCUTANEOUS
  Administered 2022-09-08 – 2022-09-09 (×2): 5 [IU] via SUBCUTANEOUS
  Administered 2022-09-09: 2 [IU] via SUBCUTANEOUS
  Administered 2022-09-09: 1 [IU] via SUBCUTANEOUS
  Administered 2022-09-09: 2 [IU] via SUBCUTANEOUS

## 2022-09-08 MED ORDER — HYDROMORPHONE HCL 1 MG/ML IJ SOLN
1.0000 mg | Freq: Once | INTRAMUSCULAR | Status: AC
  Administered 2022-09-08: 1 mg via INTRAVENOUS
  Filled 2022-09-08: qty 1

## 2022-09-08 NOTE — Assessment & Plan Note (Signed)
-   Blood pressure currently elevated in the ER - Resumed home regimen but held torsemide in setting of N/V -Torsemide resumed at discharge

## 2022-09-08 NOTE — H&P (Signed)
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 She was just hospitalized 1/26 - 1/28 for gastroparesis flare and treated with anti-emetics, fluids and supportive care with improvement.  She again returns with N/V consistent with gastroparesis symptoms with a mild metabolic acidosis no AG.  She is admitted for similar treatment as last admission.   I have personally briefly reviewed patient's old medical [facility] and discussed patient with the ER provider when appropriate/indicated.  Assessment and Plan: * Intractable nausea and vomiting secondary to diabetic gastroparesis - No obvious precipitating factors.  Patient endorses typical symptoms consistent with prior gastroparesis flare -Continue scopolamine patch - Continue scheduled Reglan and Ativan - Continue as needed Phenergan and Compazine - Continue low rate fluids - Full liquid diet and will advance as able  Metabolic acidosis - no AG; probably borderline trying to go into DKA with N/V - continue IVF and SSI - trend BMP  Chronic diastolic CHF (congestive heart failure) (HCC) NYHA I Stage C complicated by SCD s/p ICD  -Follows outpatient with cardiology and EP - No S/S exacerbation - Low rate fluids and monitor for any signs of volume overload.  Mild edema appreciated in legs  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) - Last admission treated with 10 units basal insulin and SSI - Does have insulin pump use at home which she has turned off for admission - Continue on SSI for now and will start basal if needed as intake improves  CKD (chronic kidney disease) stage 4, GFR 15-29 ml/min (HCC) - patient has  history of CKD4. Baseline creat ~ 3.5, eGFR 15 -Followed outpatient by nephrology.  Planning to undergo workup with vascular surgery in anticipation for eventual dialysis   Anemia of chronic disease - Baseline hemoglobin around 9 g/dL  Essential hypertension - Blood pressure currently elevated in the ER - Resume home regimen but hold torsemide in setting of in/V - PRN labetalol or hydralazine as well  CAD (coronary artery disease) - no epicardial disease (CAC 0) on 2021 stress test -Continue as needed NTG -Not on aspirin in setting of GI bleed history  Mixed hyperlipidemia - Continue statin   Code Status:     Code Status: Full Code  DVT Prophylaxis: HSQ     Anticipated disposition is to: Home  History: Past Medical History:  Diagnosis Date   Anemia    Anxiety    Arthritis    Asthma    CHF (congestive heart failure) ([place])    Depression    Diabetic ulcer of left foot ([place]) 05/12/2013   Gastroparesis    Generalized abdominal pain    History of chicken pox    Loss of weight 12/02/2019   Migraines    Mixed hyperlipidemia 09/05/2022   Mood disorder ([place])    anxiety   Prurigo nodularis    with diabetic dermopathy   Type 1 diabetes, uncontrolled, with neuropathy    [place]   Ulcers of both lower legs ([place]) 02/20/2014    Past Surgical History:  Procedure Laterality Date   BIOPSY  08/11/2020   Procedure: BIOPSY;  Surgeon: Ladene Artist, MD;  Location: [facility] ENDOSCOPY;  Service: Endoscopy;;  BIOPSY  04/02/2022   Procedure: BIOPSY;  Surgeon: Jackquline Denmark, MD;  Location: [facility];  Service: Gastroenterology;;   ESOPHAGOGASTRODUODENOSCOPY (EGD) WITH PROPOFOL N/A 08/11/2020   Procedure: ESOPHAGOGASTRODUODENOSCOPY (EGD) WITH PROPOFOL;  Surgeon: Ladene Artist, MD;  Location: [facility]OSCOPY;  Service: Endoscopy;  Laterality: N/A;   ESOPHAGOGASTRODUODENOSCOPY (EGD) WITH PROPOFOL N/A 04/02/2022   Procedure: ESOPHAGOGASTRODUODENOSCOPY (EGD) WITH PROPOFOL;  Surgeon: Jackquline Denmark,  MD;  Location: [facility] ENDOSCOPY;  Service: Gastroenterology;  Laterality: N/A;   ESOPHAGOGASTRODUODENOSCOPY (EGD) WITH PROPOFOL N/A 05/23/2022   Procedure: ESOPHAGOGASTRODUODENOSCOPY (EGD) WITH PROPOFOL;  Surgeon: Lucilla Lame, MD;  Location: [facility] ENDOSCOPY;  Service: Endoscopy;  Laterality: N/A;   ICD IMPLANT N/A 04/09/2022   Procedure: ICD IMPLANT;  Surgeon: Constance Haw, MD;  Location: [place] [facility];  Service: Cardiovascular;  Laterality: N/A;   treadmill stress test  01/2013   WNL, low risk study     reports that she has never smoked. She has never used smokeless tobacco. She reports that she does not currently use alcohol. She reports that she does not use drugs.  Allergies  Allergen Reactions   Atorvastatin Other (See Comments)    Dizziness    Dulaglutide Nausea And Vomiting and Other (See Comments)    Caused pancreatitis    Sertraline     Other Reaction(s): vomiting   Zofran [Ondansetron] Nausea And Vomiting and Other (See Comments)    Causes nausea vomiting to worsen / doesn't really work for patient.   Amoxicillin Itching and Rash   Lantus [Insulin Glargine] Itching and Rash   Miconazole Nitrate Nausea And Vomiting and Rash    Family History  Problem Relation Age of Onset   Diabetes Mother        type 2   Hypertension Mother    Thyroid disease Mother    Bipolar disorder Mother    Heart disease Mother    Calcium disorder Mother    Cancer Maternal Grandmother        Breast, stomach   Cancer Paternal Grandmother        stomach, lung (smoker)   Diabetes Paternal Grandmother    Diabetes Paternal Grandfather    Heart failure Sister    Diabetes Sister    Stroke Maternal Aunt    Cancer Maternal Uncle        prostate   CAD Maternal Aunt        stents   Cancer Maternal Aunt 64       ovarian   Home Medications: Prior to Admission medications   Medication Sig Start Date End Date Taking? Authorizing Provider  acetaminophen (TYLENOL) 500 MG tablet as needed for  moderate pain or mild pain.   Yes [provider]  albuterol (PROAIR HFA) 108 (90 Base) MCG/ACT inhaler Inhale 2 puffs into the lungs every 6 (six) hours as needed for wheezing or shortness of breath. 07/03/20  Yes [name], PA-C  Ensure Max Protein (ENSURE MAX PROTEIN) LIQD Take 330 mLs (11 oz total) by mouth daily. 04/30/22  Yes Hongalgi, [name], MD  hydrALAZINE (APRESOLINE) 50 MG tablet Take 1 tablet (50 mg total) by mouth 3 (three) times daily. 07/17/22  Yes Domenic Polite, MD  hydrOXYzine (ATARAX) 50 MG tablet Take 50 mg by mouth in the morning, at noon, and at bedtime.   Yes [provider]  insulin aspart (NOVOLOG) 100 UNIT/ML injection Inject 150 Units into the skin See admin instructions. Patient uses this with the [place]. Units vary according to blood  sugar. Loads 150 units every 3 days.   Yes [provider]  LORazepam (ATIVAN) 0.5 MG tablet Take 1 tablet (0.5 mg total) by mouth every 8 (eight) hours as needed for anxiety (Refractory nausea and vomiting). 07/17/22  Yes Domenic Polite, MD  metoCLOPramide (REGLAN) 10 MG tablet Take 1 tablet (10 mg total) by mouth 3 (three) times daily before meals. 07/17/22  Yes Domenic Polite, MD  metoprolol succinate (TOPROL-XL) 50 MG 24 hr tablet Take 1 tablet (50 mg total) by mouth daily. Take with or immediately following a meal. 09/05/22  Yes Chandrasekhar, Mahesh A, MD  nitroGLYCERIN (NITROSTAT) 0.4 MG SL tablet Place 1 tablet (0.4 mg total) under the tongue every 5 (five) minutes as needed for chest pain. 09/05/22 12/04/22 Yes Chandrasekhar, Mahesh A, MD  pantoprazole (PROTONIX) 40 MG tablet Take 1 tablet (40 mg total) by mouth 2 (two) times daily before a meal. 05/24/22 09/08/22 Yes Rai, Ripudeep K, MD  polyethylene glycol (MIRALAX / GLYCOLAX) 17 g packet Take 17 g by mouth daily as needed for moderate constipation.   Yes [provider]  pregabalin (LYRICA) 50 MG capsule Take 50 mg by mouth 3 (three) times daily  as needed (neuropathy). 12/05/21  Yes [provider]  prochlorperazine (COMPAZINE) 10 MG tablet Take 1 tablet (10 mg total) by mouth every 6 (six) hours as needed for nausea or vomiting. 05/24/22  Yes Rai, Ripudeep K, MD  promethazine (PHENERGAN) 12.5 MG tablet Take 12.5 mg by mouth 2 (two) times daily as needed for nausea or vomiting.   Yes [provider]  scopolamine (TRANSDERM-SCOP) 1 MG/3DAYS Place 1 patch (1.5 mg total) onto the skin every 3 (three) days. 05/26/22  Yes Rai, Ripudeep K, MD  tobramycin (TOBREX) 0.3 % ophthalmic solution Place 1-2 drops into both eyes See admin instructions. Instill 1-2 drops into both eyes 3 times daily the day before, day of, and the day after every 6 weeks eye injections per patient 11/17/20  Yes [provider]  torsemide (DEMADEX) 20 MG tablet TAKE THREE TABLETS BY MOUTH DAILY 08/30/22  Yes Clegg, Amy D, NP  Continuous Blood Gluc Sensor (FREESTYLE LIBRE 2 SENSOR) MISC Inject 1 Device into the skin every 14 (fourteen) days. 03/20/20   [provider]  Multiple Vitamin (MULTIVITAMIN WITH MINERALS) TABS tablet Take 1 tablet by mouth daily. Patient not taking: Reported on 09/08/2022 04/30/22   Modena Jansky, MD  pravastatin (PRAVACHOL) 40 MG tablet Take 1 tablet (40 mg total) by mouth every evening. Patient not taking: Reported on 09/08/2022 09/05/22   Werner Lean, MD  promethazine (PHENERGAN) 25 MG suppository Unwrap and place 1 suppository (25 mg total) rectally every 6 (six) hours as needed for nausea or vomiting. Patient not taking: Reported on 09/08/2022 07/17/22   Domenic Polite, MD   Review of Systems:  Review of Systems  Constitutional: Negative.   HENT: Negative.    Eyes: Negative.   Respiratory: Negative.    Cardiovascular:  Positive for leg swelling.  Gastrointestinal:  Positive for abdominal pain, nausea and vomiting.  Genitourinary:  Positive for dysuria.  Musculoskeletal: Negative.   Skin: Negative.    Neurological: Negative.   Endo/Heme/Allergies: Negative.   Psychiatric/Behavioral: Negative.     Physical Exam:  Vitals:   09/08/22 1015 09/08/22 1115 09/08/22 1144 09/08/22 1200  BP: (!) 178/120 (!) 161/102  (!) 169/107  Pulse: 99 91  94  Resp: 20 19  (!) 24  Temp:   97.8 F (36.6 C)  TempSrc:   Oral   SpO2: 98% 98%  96%  Weight:      Height:       Physical Exam Constitutional:      General: She is not in acute distress.    Appearance: Normal appearance.  HENT:     Head: Normocephalic and atraumatic.     Mouth/Throat:     Mouth: Mucous membranes are moist.  Eyes:     Extraocular Movements: Extraocular movements intact.  Cardiovascular:     Rate and Rhythm: Normal rate and regular rhythm.  Pulmonary:     Effort: Pulmonary effort is normal.     Breath sounds: Normal breath sounds.  Abdominal:     General: Bowel sounds are normal. There is no distension.     Palpations: Abdomen is soft.     Tenderness: There is no abdominal tenderness.  Musculoskeletal:        General: Swelling present. Normal range of motion.     Cervical back: Normal range of motion and neck supple.     Comments: 1+ B/L LE edema  Skin:    General: Skin is warm and dry.  Neurological:     General: No focal deficit present.     Mental Status: She is alert and oriented to person, place, and time.  Psychiatric:        Mood and Affect: Mood normal.        Behavior: Behavior normal.      Labs on Admission:  I have personally reviewed following labs and imaging studies Results for orders placed or performed during the hospital encounter of 09/08/22 (from the past 24 hour(s))  Comprehensive metabolic panel     Status: Abnormal   Collection Time: 09/08/22  7:26 AM  Result Value Ref Range   Sodium 136 135 - 145 mmol/L   Potassium 3.9 3.5 - 5.1 mmol/L   Chloride 106 98 - 111 mmol/L   CO2 19 (L) 22 - 32 mmol/L   Glucose, Bld 453 (H) 70 - 99 mg/dL   BUN 40 (H) 6 - 20 mg/dL   Creatinine, Ser 3.18  (H) 0.44 - 1.00 mg/dL   Calcium 8.5 (L) 8.9 - 10.3 mg/dL   Total Protein 5.9 (L) 6.5 - 8.1 g/dL   Albumin 2.7 (L) 3.5 - 5.0 g/dL   AST 14 (L) 15 - 41 U/L   ALT 16 0 - 44 U/L   Alkaline Phosphatase 76 38 - 126 U/L   Total Bilirubin 1.2 0.3 - 1.2 mg/dL   GFR, Estimated 17 (L) >60 mL/min   Anion gap 11 5 - 15  Lipase, blood     Status: None   Collection Time: 09/08/22  7:26 AM  Result Value Ref Range   Lipase 27 11 - 51 U/L  Magnesium     Status: None   Collection Time: 09/08/22  7:26 AM  Result Value Ref Range   Magnesium 1.8 1.7 - 2.4 mg/dL  CBG monitoring, ED     Status: Abnormal   Collection Time: 09/08/22  7:27 AM  Result Value Ref Range   Glucose-Capillary 438 (H) 70 - 99 mg/dL  CBC with Differential/Platelet     Status: Abnormal   Collection Time: 09/08/22  8:44 AM  Result Value Ref Range   WBC 8.3 4.0 - 10.5 K/uL   RBC 2.99 (L) 3.87 - 5.11 MIL/uL   Hemoglobin 8.6 (L) 12.0 - 15.0 g/dL   HCT 26.0 (L) 36.0 - 46.0 %   MCV  87.0 80.0 - 100.0 fL   MCH 28.8 26.0 - 34.0 pg   MCHC 33.1 30.0 - 36.0 g/dL   RDW 14.3 11.5 - 15.5 %   Platelets 237 150 - 400 K/uL   nRBC 0.0 0.0 - 0.2 %   Neutrophils Relative % 88 %   Neutro Abs 7.3 1.7 - 7.7 K/uL   Lymphocytes Relative 8 %   Lymphs Abs 0.6 (L) 0.7 - 4.0 K/uL   Monocytes Relative 3 %   Monocytes Absolute 0.3 0.1 - 1.0 K/uL   Eosinophils Relative 0 %   Eosinophils Absolute 0.0 0.0 - 0.5 K/uL   Basophils Relative 0 %   Basophils Absolute 0.0 0.0 - 0.1 K/uL   Immature Granulocytes 1 %   Abs Immature Granulocytes 0.06 0.00 - 0.07 K/uL  CBG monitoring, ED     Status: Abnormal   Collection Time: 09/08/22  9:01 AM  Result Value Ref Range   Glucose-Capillary 434 (H) 70 - 99 mg/dL  CBG monitoring, ED     Status: Abnormal   Collection Time: 09/08/22 10:37 AM  Result Value Ref Range   Glucose-Capillary 252 (H) 70 - 99 mg/dL     Radiological Exams on Admission: No results found. No orders to display    Consults called:  N/a    EKG: Independently reviewed. Sinus tach; inverted T wave in V5, V6 seen on prior EKG   Dwyane Dee, MD [facility] 09/08/2022, 12:35 PM

## 2022-09-08 NOTE — Assessment & Plan Note (Addendum)
-   No obvious precipitating factors.  Patient endorses typical symptoms consistent with prior gastroparesis flare -Patient responded well to scopolamine patch and scheduled Reglan, Ativan along with as needed Phenergan and Compazine - Treated with fluids while diet was being advanced

## 2022-09-08 NOTE — Hospital Course (Signed)
Ms. 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 She was just hospitalized 1/26 - 1/28 for gastroparesis flare and treated with anti-emetics, fluids and supportive care with improvement.  She again returns with N/V consistent with gastroparesis symptoms with a mild metabolic acidosis no AG.  She is admitted for similar treatment as last admission.  See below for further A&P.

## 2022-09-08 NOTE — Assessment & Plan Note (Signed)
-   Baseline hemoglobin around 9 g/dL

## 2022-09-08 NOTE — Assessment & Plan Note (Signed)
Continue statin. 

## 2022-09-08 NOTE — ED Notes (Signed)
ED TO INPATIENT HANDOFF REPORT  ED Nurse Name and Phone #:   S Name/Age/Gender [name] 52 y.o. female Room/Bed: 025C/025C  Code Status   Code Status: Prior  Home/SNF/Other Home Patient oriented to: self, place, time, and situation Is this baseline? Yes   Triage Complete: Triage complete  Chief Complaint Gastroparesis [K31.84]  Triage Note Pt BIB [facility] for vomiting. Pt states that she has been throwing up since 1030 yesterday. Complaining of RUQ abdominal pain, tender to touch. Noticed that her sugar was dropping so she turned her insulin pump off yesterday about 1400. Hx of CHF, gastroparesis.  530 this morning she tried to take some zofran but continued to throw up. 100 cc fluids and 4 mg of zofran PTA.    B 196/120 HR 112 CBG 506   Allergies Allergies  Allergen Reactions   Atorvastatin Other (See Comments)    Dizziness    Dulaglutide Nausea And Vomiting and Other (See Comments)    Caused pancreatitis    Sertraline     Other Reaction(s): vomiting   Zofran [Ondansetron] Nausea And Vomiting and Other (See Comments)    Causes nausea vomiting to worsen / doesn't really work for patient.   Amoxicillin Itching and Rash   Lantus [Insulin Glargine] Itching and Rash   Miconazole Nitrate Nausea And Vomiting and Rash    Level of Care/Admitting Diagnosis ED Disposition     ED Disposition  Admit   Condition  --   Comment  Hospital [facility] [[id number]]  Level of Care: Telemetry Medical [104]  May admit patient to [facility] or [facility] if equivalent level of care is available:: No  Covid Evaluation: Asymptomatic - no recent exposure (last 10 days) testing not required  Diagnosis: Gastroparesis [536.3.ICD-9-CM]  Admitting Physician: Lewie Chamber [phone]  Attending Physician: Lewie Chamber [phone]  Certification:: I certify this patient will need inpatient services for at least 2 midnights  Estimated Length of Stay: 2           B Medical/Surgery History Past Medical History:  Diagnosis Date   Anemia    Anxiety    Arthritis    Asthma    CHF (congestive heart failure) (HCC)    Depression    Diabetic ulcer of left foot (HCC) 05/12/2013   Gastroparesis    Generalized abdominal pain    History of chicken pox    Loss of weight 12/02/2019   Migraines    Mixed hyperlipidemia 09/05/2022   Mood disorder (HCC)    anxiety   Prurigo nodularis    with diabetic dermopathy   Type 1 diabetes, uncontrolled, with neuropathy    [place]   Ulcers of both lower legs (HCC) 02/20/2014   Past Surgical History:  Procedure Laterality Date   BIOPSY  08/11/2020   Procedure: BIOPSY;  Surgeon: Meryl Dare, MD;  Location: [facility];  Service: Endoscopy;;   BIOPSY  04/02/2022   Procedure: BIOPSY;  Surgeon: Lynann Bologna, MD;  Location: [facility];  Service: Gastroenterology;;   ESOPHAGOGASTRODUODENOSCOPY (EGD) WITH PROPOFOL N/A 08/11/2020   Procedure: ESOPHAGOGASTRODUODENOSCOPY (EGD) WITH PROPOFOL;  Surgeon: Meryl Dare, MD;  Location: [facility];  Service: Endoscopy;  Laterality: N/A;   ESOPHAGOGASTRODUODENOSCOPY (EGD) WITH PROPOFOL N/A 04/02/2022   Procedure: ESOPHAGOGASTRODUODENOSCOPY (EGD) WITH PROPOFOL;  Surgeon: Lynann Bologna, MD;  Location: [facility];  Service: Gastroenterology;  Laterality: N/A;   ESOPHAGOGASTRODUODENOSCOPY (EGD) WITH PROPOFOL N/A 05/23/2022   Procedure: ESOPHAGOGASTRODUODENOSCOPY (EGD) WITH PROPOFOL;  Surgeon: Midge Minium, MD;  Location: [facility];  Service: Endoscopy;  Laterality: N/A;   ICD IMPLANT N/A 04/09/2022   Procedure: ICD IMPLANT;  Surgeon: Constance Haw, MD;  Location: [facility];  Service: Cardiovascular;  Laterality: N/A;   treadmill stress test  01/2013   WNL, low risk study     A IV Location/Drains/Wounds Patient Lines/Drains/Airways Status     Active Line/Drains/Airways     Name Placement date Placement time Site Days   Peripheral IV 09/08/22 24 G  Left Hand 09/08/22  0755  Hand  less than 1            Intake/Output Last 24 hours No intake or output data in the 24 hours ending 09/08/22 1122  Labs/Imaging Results for orders placed or performed during the hospital encounter of 09/08/22 (from the past 48 hour(s))  Comprehensive metabolic panel     Status: Abnormal   Collection Time: 09/08/22  7:26 AM  Result Value Ref Range   Sodium 136 135 - 145 mmol/L   Potassium 3.9 3.5 - 5.1 mmol/L   Chloride 106 98 - 111 mmol/L   CO2 19 (L) 22 - 32 mmol/L   Glucose, Bld 453 (H) 70 - 99 mg/dL    Comment: Glucose reference range applies only to samples taken after fasting for at least 8 hours.   BUN 40 (H) 6 - 20 mg/dL   Creatinine, Ser 3.18 (H) 0.44 - 1.00 mg/dL   Calcium 8.5 (L) 8.9 - 10.3 mg/dL   Total Protein 5.9 (L) 6.5 - 8.1 g/dL   Albumin 2.7 (L) 3.5 - 5.0 g/dL   AST 14 (L) 15 - 41 U/L   ALT 16 0 - 44 U/L   Alkaline Phosphatase 76 38 - 126 U/L   Total Bilirubin 1.2 0.3 - 1.2 mg/dL   GFR, Estimated 17 (L) >60 mL/min    Comment: (NOTE) Calculated using the CKD-EPI Creatinine Equation (2021)    Anion gap 11 5 - 15    Comment: Performed at [facility], [place] [address], [place], [place] [address]  Lipase, blood     Status: None   Collection Time: 09/08/22  7:26 AM  Result Value Ref Range   Lipase 27 11 - 51 U/L    Comment: Performed at [facility] [address], [place], [place] [address]  Magnesium     Status: None   Collection Time: 09/08/22  7:26 AM  Result Value Ref Range   Magnesium 1.8 1.7 - 2.4 mg/dL    Comment: Performed at [facility] [address] [place], [place] [address]  CBG monitoring, ED     Status: Abnormal   Collection Time: 09/08/22  7:27 AM  Result Value Ref Range   Glucose-Capillary 438 (H) 70 - 99 mg/dL    Comment: Glucose reference range applies only to samples taken after fasting for at least 8 hours.  CBC with Differential/Platelet     Status: Abnormal   Collection Time:  09/08/22  8:44 AM  Result Value Ref Range   WBC 8.3 4.0 - 10.5 K/uL   RBC 2.99 (L) 3.87 - 5.11 MIL/uL   Hemoglobin 8.6 (L) 12.0 - 15.0 g/dL   HCT 26.0 (L) 36.0 - 46.0 %   MCV 87.0 80.0 - 100.0 fL   MCH 28.8 26.0 - 34.0 pg   MCHC 33.1 30.0 - 36.0 g/dL   RDW 14.3 11.5 - 15.5 %   Platelets 237 150 - 400 K/uL  nRBC 0.0 0.0 - 0.2 %   Neutrophils Relative % 88 %   Neutro Abs 7.3 1.7 - 7.7 K/uL   Lymphocytes Relative 8 %   Lymphs Abs 0.6 (L) 0.7 - 4.0 K/uL   Monocytes Relative 3 %   Monocytes Absolute 0.3 0.1 - 1.0 K/uL   Eosinophils Relative 0 %   Eosinophils Absolute 0.0 0.0 - 0.5 K/uL   Basophils Relative 0 %   Basophils Absolute 0.0 0.0 - 0.1 K/uL   Immature Granulocytes 1 %   Abs Immature Granulocytes 0.06 0.00 - 0.07 K/uL    Comment: Performed at [facility] [address], [place], [place] [address]  CBG monitoring, ED     Status: Abnormal   Collection Time: 09/08/22  9:01 AM  Result Value Ref Range   Glucose-Capillary 434 (H) 70 - 99 mg/dL    Comment: Glucose reference range applies only to samples taken after fasting for at least 8 hours.  CBG monitoring, ED     Status: Abnormal   Collection Time: 09/08/22 10:37 AM  Result Value Ref Range   Glucose-Capillary 252 (H) 70 - 99 mg/dL    Comment: Glucose reference range applies only to samples taken after fasting for at least 8 hours.   No results found.  Pending Labs Unresulted Labs (From admission, onward)     Start     Ordered   09/08/22 0726  CBC with Differential  Once,   STAT        09/08/22 0726            Vitals/Pain Today's Vitals   09/08/22 0930 09/08/22 0945 09/08/22 1000 09/08/22 1015  BP: (!) 168/103 (!) 168/101 (!) 180/118 (!) 178/120  Pulse: 90 93 96 99  Resp: (!) 22 (!) $Remo'24 18 20  'FtdwO$ Temp:      TempSrc:      SpO2: 96% 96% 98% 98%  Weight:      Height:      PainSc:        Isolation Precautions No active isolations  Medications Medications  lactated ringers bolus 1,000 mL (1,000  mLs Intravenous New Bag/Given 09/08/22 0759)  droperidol (INAPSINE) 2.5 MG/ML injection 1.25 mg (1.25 mg Intravenous Given 09/08/22 0755)  HYDROmorphone (DILAUDID) injection 1 mg (1 mg Intravenous Given 09/08/22 0756)  diphenhydrAMINE (BENADRYL) injection 25 mg (25 mg Intravenous Given 09/08/22 0756)  insulin aspart (novoLOG) injection 15 Units (15 Units Intravenous Given 09/08/22 0912)  metoCLOPramide (REGLAN) injection 10 mg (10 mg Intravenous Given 09/08/22 1032)  diphenhydrAMINE (BENADRYL) injection 25 mg (25 mg Intravenous Given 09/08/22 1032)    Mobility walks     Focused Assessments     R Recommendations: See Admitting Provider Note  Report given to:   Additional Notes:

## 2022-09-08 NOTE — Assessment & Plan Note (Signed)
-   patient has history of CKD4. Baseline creat ~ 3.5, eGFR 15 -Followed outpatient by nephrology.  Planning to undergo workup with vascular surgery in anticipation for eventual dialysis

## 2022-09-08 NOTE — ED Provider Notes (Signed)
[facility] Provider Note   CSN: [id number] Arrival date & time: 09/08/22  1700     History  No chief complaint on file.   [name] is a 52 y.o. female.  52 yo F with a chief complaints of intractable nausea and vomiting.  This unfortunately is an ongoing issue for her.  She has a history of gastroparesis.  Been going on for the past couple days.  No new areas of pain no fevers.        Home Medications Prior to Admission medications   Medication Sig Start Date End Date Taking? Authorizing Provider  acetaminophen (TYLENOL) 500 MG tablet as needed for moderate pain or mild pain.    [provider]  albuterol (PROAIR HFA) 108 (90 Base) MCG/ACT inhaler Inhale 2 puffs into the lungs every 6 (six) hours as needed for wheezing or shortness of breath. 07/03/20   [name], PA-C  Continuous Blood Gluc Sensor (FREESTYLE LIBRE 2 SENSOR) MISC Inject 1 Device into the skin every 14 (fourteen) days. 03/20/20   [provider]  Ensure Max Protein (ENSURE MAX PROTEIN) LIQD Take 330 mLs (11 oz total) by mouth daily. 04/30/22   Hongalgi, [name], MD  hydrALAZINE (APRESOLINE) 50 MG tablet Take 1 tablet (50 mg total) by mouth 3 (three) times daily. 07/17/22   Domenic Polite, MD  hydrOXYzine (ATARAX) 50 MG tablet Take 50 mg by mouth in the morning, at noon, and at bedtime.    [provider]  insulin aspart (NOVOLOG) 100 UNIT/ML injection Inject 150 Units into the skin See admin instructions. Patient uses this with the [place]. Units vary according to blood sugar. Loads 150 units every 3 days.    [provider]  LORazepam (ATIVAN) 0.5 MG tablet Take 1 tablet (0.5 mg total) by mouth every 8 (eight) hours as needed for anxiety (Refractory nausea and vomiting). 07/17/22   Domenic Polite, MD  metoCLOPramide (REGLAN) 10 MG tablet Take 1 tablet (10 mg total) by mouth 3 (three) times daily before meals. 07/17/22    Domenic Polite, MD  metoprolol succinate (TOPROL-XL) 50 MG 24 hr tablet Take 1 tablet (50 mg total) by mouth daily. Take with or immediately following a meal. 09/05/22   Chandrasekhar, Mahesh A, MD  Multiple Vitamin (MULTIVITAMIN WITH MINERALS) TABS tablet Take 1 tablet by mouth daily. 04/30/22   Hongalgi, [name], MD  nitroGLYCERIN (NITROSTAT) 0.4 MG SL tablet Place 1 tablet (0.4 mg total) under the tongue every 5 (five) minutes as needed for chest pain. 09/05/22 12/04/22  Werner Lean, MD  pantoprazole (PROTONIX) 40 MG tablet Take 1 tablet (40 mg total) by mouth 2 (two) times daily before a meal. 05/24/22 09/05/22  Rai, Ripudeep K, MD  polyethylene glycol (MIRALAX / GLYCOLAX) 17 g packet Take 17 g by mouth daily as needed for moderate constipation.    [provider]  pravastatin (PRAVACHOL) 40 MG tablet Take 1 tablet (40 mg total) by mouth every evening. 09/05/22   Chandrasekhar, Mahesh A, MD  pregabalin (LYRICA) 50 MG capsule as needed (neuropathy). 12/05/21   [provider]  prochlorperazine (COMPAZINE) 10 MG tablet Take 1 tablet (10 mg total) by mouth every 6 (six) hours as needed for nausea or vomiting. 05/24/22   Rai, [name], MD  promethazine (PHENERGAN) 25 MG suppository Unwrap and place 1 suppository (25 mg total) rectally every 6 (six) hours as needed for nausea or vomiting. 07/17/22   Broadus John,  Jacinta Shoe, MD  scopolamine (TRANSDERM-SCOP) 1 MG/3DAYS Place 1 patch (1.5 mg total) onto the skin every 3 (three) days. 05/26/22   Rai, Ripudeep K, MD  tobramycin (TOBREX) 0.3 % ophthalmic solution Place 1-2 drops into both eyes See admin instructions. Instill 1-2 drops into both eyes 3 times daily the day before, day of, and the day after every 6 weeks eye injections per patient 11/17/20   [provider]  torsemide (DEMADEX) 20 MG tablet TAKE THREE TABLETS BY MOUTH DAILY 08/30/22   Clegg, Amy D, NP      Allergies    Atorvastatin, Dulaglutide, Sertraline, Zofran  [ondansetron], Amoxicillin, Lantus [insulin glargine], and Miconazole nitrate    Review of Systems   Review of Systems  Physical Exam Updated Vital Signs BP (!) 171/108   Pulse 94   Temp 98.1 F (36.7 C) (Oral)   Resp (!) 23   Ht 6' (1.829 m)   Wt 80.7 kg   LMP 11/17/2021 (Approximate)   SpO2 95%   BMI 24.14 kg/m  Physical Exam Vitals and nursing note reviewed.  Constitutional:      General: She is not in acute distress.    Appearance: She is well-developed. She is not diaphoretic.  HENT:     Head: Normocephalic and atraumatic.  Eyes:     Pupils: Pupils are equal, round, and reactive to light.  Cardiovascular:     Rate and Rhythm: Normal rate and regular rhythm.     Heart sounds: No murmur heard.    No friction rub. No gallop.  Pulmonary:     Effort: Pulmonary effort is normal.     Breath sounds: No wheezing or rales.  Abdominal:     General: There is no distension.     Palpations: Abdomen is soft.     Tenderness: There is no abdominal tenderness.     Comments: Benign abdominal exam  Musculoskeletal:        General: No tenderness.     Cervical back: Normal range of motion and neck supple.  Skin:    General: Skin is warm and dry.  Neurological:     Mental Status: She is alert and oriented to person, place, and time.  Psychiatric:        Behavior: Behavior normal.     ED Results / Procedures / Treatments   Labs (all labs ordered are listed, but only abnormal results are displayed) Labs Reviewed  COMPREHENSIVE METABOLIC PANEL - Abnormal; Notable for the following components:      Result Value   CO2 19 (*)    Glucose, Bld 453 (*)    BUN 40 (*)    Creatinine, Ser 3.18 (*)    Calcium 8.5 (*)    Total Protein 5.9 (*)    Albumin 2.7 (*)    AST 14 (*)    GFR, Estimated 17 (*)    All other components within normal limits  CBC WITH DIFFERENTIAL/PLATELET - Abnormal; Notable for the following components:   RBC 2.99 (*)    Hemoglobin 8.6 (*)    HCT 26.0 (*)     Lymphs Abs 0.6 (*)    All other components within normal limits  CBG MONITORING, ED - Abnormal; Notable for the following components:   Glucose-Capillary 438 (*)    All other components within normal limits  CBG MONITORING, ED - Abnormal; Notable for the following components:   Glucose-Capillary 434 (*)    All other components within normal limits  LIPASE, BLOOD  MAGNESIUM  CBC WITH DIFFERENTIAL/PLATELET    EKG EKG Interpretation  Date/Time:  Sunday September 08 2022 07:31:20 EST Ventricular Rate:  111 PR Interval:  145 QRS Duration: 83 QT Interval:  346 QTC Calculation: 471 R Axis:   81 Text Interpretation: Sinus tachycardia Probable left atrial enlargement Abnormal T, consider ischemia, lateral leads No significant change since last tracing Confirmed by Deno Etienne [phone]) on 09/08/2022 7:43:16 AM  Radiology No results found.  Procedures Procedures    Medications Ordered in ED Medications  metoCLOPramide (REGLAN) injection 10 mg (has no administration in time range)  diphenhydrAMINE (BENADRYL) injection 25 mg (has no administration in time range)  lactated ringers bolus 1,000 mL (1,000 mLs Intravenous New Bag/Given 09/08/22 0759)  droperidol (INAPSINE) 2.5 MG/ML injection 1.25 mg (1.25 mg Intravenous Given 09/08/22 0755)  HYDROmorphone (DILAUDID) injection 1 mg (1 mg Intravenous Given 09/08/22 0756)  diphenhydrAMINE (BENADRYL) injection 25 mg (25 mg Intravenous Given 09/08/22 0756)  insulin aspart (novoLOG) injection 15 Units (15 Units Intravenous Given 09/08/22 0912)    ED Course/ Medical Decision Making/ A&P                             Medical Decision Making Amount and/or Complexity of Data Reviewed Labs: ordered. ECG/medicine tests: ordered.  Risk Prescription drug management.   52 yo F with a cc of intractable n/v.  Patient unfortunately has a significant past medical history of gastroparesis and 19 ED visits in the past 6 months and had been admitted 10 times.  Will  aggressively treat pain and nausea.  Blood work.  IV fluids.  Reassess.  Patient's renal function is slightly better than baseline, hyperglycemic here.  Mild acidosis without anion gap.  Patient was given droperidol and Dilaudid and Benadryl with some improvement of her symptoms but still unable to tolerate by mouth.  Will give a dose of Reglan.  Will discuss with medicine.  The patients results and plan were reviewed and discussed.   Any x-rays performed were independently reviewed by myself.   Differential diagnosis were considered with the presenting HPI.  Medications  metoCLOPramide (REGLAN) injection 10 mg (has no administration in time range)  diphenhydrAMINE (BENADRYL) injection 25 mg (has no administration in time range)  lactated ringers bolus 1,000 mL (1,000 mLs Intravenous New Bag/Given 09/08/22 0759)  droperidol (INAPSINE) 2.5 MG/ML injection 1.25 mg (1.25 mg Intravenous Given 09/08/22 0755)  HYDROmorphone (DILAUDID) injection 1 mg (1 mg Intravenous Given 09/08/22 0756)  diphenhydrAMINE (BENADRYL) injection 25 mg (25 mg Intravenous Given 09/08/22 0756)  insulin aspart (novoLOG) injection 15 Units (15 Units Intravenous Given 09/08/22 0912)    Vitals:   09/08/22 0815 09/08/22 0830 09/08/22 0900 09/08/22 0915  BP:   (!) 163/107 (!) 171/108  Pulse: 99 94 96 94  Resp: (!) 25 (!) 22 (!) 23 (!) 23  Temp:      TempSrc:      SpO2: 94% 95% 96% 95%  Weight:      Height:        Final diagnoses:  Gastroparesis    Admission/ observation were discussed with the admitting physician, patient and/or family and they are comfortable with the plan.          Final Clinical Impression(s) / ED Diagnoses Final diagnoses:  Gastroparesis    Rx / DC Orders ED Discharge Orders     None         Deno Etienne, DO 09/08/22 1011

## 2022-09-08 NOTE — Assessment & Plan Note (Signed)
-   no AG; probably borderline trying to go into DKA with N/V - continue IVF and SSI - trend BMP

## 2022-09-08 NOTE — Assessment & Plan Note (Addendum)
-   no epicardial disease (CAC 0) on 2021 stress test -Continue as needed NTG -Not on aspirin in setting of GI bleed history

## 2022-09-08 NOTE — ED Notes (Signed)
Provided pt with ice water and crackers for PO challenge 

## 2022-09-08 NOTE — ED Triage Notes (Signed)
Pt BIB [facility] for vomiting. Pt states that she has been throwing up since 1030 yesterday. Complaining of RUQ abdominal pain, tender to touch. Noticed that her sugar was dropping so she turned her insulin pump off yesterday about 1400. Hx of CHF, gastroparesis.  530 this morning she tried to take some zofran but continued to throw up. 100 cc fluids and 4 mg of zofran PTA.    B 196/120 HR 112 CBG 506

## 2022-09-08 NOTE — Assessment & Plan Note (Signed)
-   Last admission treated with 10 units basal insulin and SSI - Does have insulin pump use at home which she has turned off for admission -CBGs controlled during hospitalization.  Discharged on home insulin pump

## 2022-09-08 NOTE — Assessment & Plan Note (Addendum)
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  Mild edema appreciated in legs

## 2022-09-09 DIAGNOSIS — E872 Acidosis, unspecified: Secondary | ICD-10-CM | POA: Diagnosis not present

## 2022-09-09 DIAGNOSIS — R112 Nausea with vomiting, unspecified: Secondary | ICD-10-CM | POA: Diagnosis not present

## 2022-09-09 DIAGNOSIS — E1040 Type 1 diabetes mellitus with diabetic neuropathy, unspecified: Secondary | ICD-10-CM | POA: Diagnosis not present

## 2022-09-09 LAB — BASIC METABOLIC PANEL
Anion gap: 8 (ref 5–15)
BUN: 37 mg/dL — ABNORMAL HIGH (ref 6–20)
CO2: 21 mmol/L — ABNORMAL LOW (ref 22–32)
Calcium: 8 mg/dL — ABNORMAL LOW (ref 8.9–10.3)
Chloride: 105 mmol/L (ref 98–111)
Creatinine, Ser: 3.52 mg/dL — ABNORMAL HIGH (ref 0.44–1.00)
GFR, Estimated: 15 mL/min — ABNORMAL LOW (ref >60)
Glucose, Bld: 181 mg/dL — ABNORMAL HIGH (ref 70–99)
Potassium: 3.8 mmol/L (ref 3.5–5.1)
Sodium: 134 mmol/L — ABNORMAL LOW (ref 135–145)

## 2022-09-09 LAB — CBC WITH DIFFERENTIAL/PLATELET
Abs Immature Granulocytes: 0.02 10*3/uL (ref 0.00–0.07)
Basophils Absolute: 0 10*3/uL (ref 0.0–0.1)
Basophils Relative: 1 %
Eosinophils Absolute: 0.1 10*3/uL (ref 0.0–0.5)
Eosinophils Relative: 2 %
HCT: 22.4 % — ABNORMAL LOW (ref 36.0–46.0)
Hemoglobin: 7.4 g/dL — ABNORMAL LOW (ref 12.0–15.0)
Immature Granulocytes: 0 %
Lymphocytes Relative: 31 %
Lymphs Abs: 2 10*3/uL (ref 0.7–4.0)
MCH: 28.8 pg (ref 26.0–34.0)
MCHC: 33 g/dL (ref 30.0–36.0)
MCV: 87.2 fL (ref 80.0–100.0)
Monocytes Absolute: 0.4 10*3/uL (ref 0.1–1.0)
Monocytes Relative: 6 %
Neutro Abs: 3.9 10*3/uL (ref 1.7–7.7)
Neutrophils Relative %: 60 %
Platelets: 212 10*3/uL (ref 150–400)
RBC: 2.57 MIL/uL — ABNORMAL LOW (ref 3.87–5.11)
RDW: 14.6 % (ref 11.5–15.5)
WBC: 6.4 10*3/uL (ref 4.0–10.5)
nRBC: 0 % (ref 0.0–0.2)

## 2022-09-09 LAB — GLUCOSE, CAPILLARY
Glucose-Capillary: 134 mg/dL — ABNORMAL HIGH (ref 70–99)
Glucose-Capillary: 170 mg/dL — ABNORMAL HIGH (ref 70–99)
Glucose-Capillary: 182 mg/dL — ABNORMAL HIGH (ref 70–99)
Glucose-Capillary: 189 mg/dL — ABNORMAL HIGH (ref 70–99)
Glucose-Capillary: 256 mg/dL — ABNORMAL HIGH (ref 70–99)
Glucose-Capillary: 338 mg/dL — ABNORMAL HIGH (ref 70–99)

## 2022-09-09 LAB — MAGNESIUM: Magnesium: 1.8 mg/dL (ref 1.7–2.4)

## 2022-09-09 MED ORDER — LOPERAMIDE HCL 2 MG PO CAPS
4.0000 mg | ORAL_CAPSULE | ORAL | Status: DC | PRN
  Administered 2022-09-09: 4 mg via ORAL
  Filled 2022-09-09: qty 2

## 2022-09-09 MED ORDER — INSULIN DETEMIR 100 UNIT/ML ~~LOC~~ SOLN
6.0000 [IU] | Freq: Every day | SUBCUTANEOUS | Status: DC
  Administered 2022-09-09 – 2022-09-10 (×2): 6 [IU] via SUBCUTANEOUS
  Filled 2022-09-09 (×2): qty 0.06

## 2022-09-09 MED ORDER — INSULIN ASPART 100 UNIT/ML IJ SOLN
0.0000 [IU] | Freq: Three times a day (TID) | INTRAMUSCULAR | Status: DC
  Administered 2022-09-09: 2 [IU] via SUBCUTANEOUS
  Administered 2022-09-09: 7 [IU] via SUBCUTANEOUS
  Administered 2022-09-10 (×2): 3 [IU] via SUBCUTANEOUS

## 2022-09-09 MED ORDER — ADULT MULTIVITAMIN W/MINERALS CH
1.0000 | ORAL_TABLET | Freq: Every day | ORAL | Status: DC
  Administered 2022-09-09 – 2022-09-10 (×2): 1 via ORAL
  Filled 2022-09-09 (×2): qty 1

## 2022-09-09 NOTE — Discharge Instructions (Signed)
Gastroparesis Nutrition Therapy  Gastroparesis means that your stomach empties very slowly. This happens when the nerves to your stomach are damaged or do not work properly. This can cause bloating, stomach discomfort or pain, feeling full after eating only a small amount of food, nausea, or vomiting. If you have diabetes in addition to gastroparesis, it is important to control your blood glucose. This will help the stomach empty.  Tips Following these tips may help your stomach empty faster:  Eat small, frequent meals (4 to 6 times per day).  Do not eat solid foods that are high in fat and do not add too much fat to foods. See the Foods Not Recommended table for foods that are high fat.  High-fat solid foods may delay the emptying of your stomach.  Liquids that contain fat, such as milkshakes, may be tolerated and can provide needed calories. Do not eat foods high in fiber. Do not take fiber supplements or fiber bulking agents for constipation.  Do not eat foods that increase acid reflux:  Acidic, spicy, fried and greasy foods  Caffeine  Mint Do not drink alcohol or smoke  Do not drink carbonated beverages, as they increase bloating.  Chew foods well before swallowing. Solid foods in the stomach do not empty well. If you have difficulty tolerating solid foods, ground foods may be better.  If symptoms are severe, semi-solid foods or liquids may need to be your main food sources. Choose liquid nutritional supplements that have less than or equal to 2 grams fiber per serving.  Sit upright while eating and sit upright or walk after meals. Do not lie down for 3 to 4 hours after eating to avoid reflux or regurgitation.  If you wish to nap during the day, nap first and then eat.  Drinking fluids at meals can take up room in your stomach, and you might not get enough calories. At every meal, first eat a grain food and a protein food or dairy product if your body can tolerate it. Drink fluids with  calories. It may be better to delay fluids until after the meal and drink more between meals.   Foods Recommended Food Group Foods Recommended   Grains Choose grain foods with less than 2 grams of fiber per serving; these will be made with white flour Crackers: saltines or graham crackers Cold cereal: puffed rice Cream of rice or wheat Grits (fine ground) Gluten free low fiber foods Pretzels White bread, toasted White rice, cook until very soft  Protein Foods Lean meat and poultry: well-cooked, very tender, moist, and chopped fine Fish: tuna, salmon, or white fish Egg whites, scrambled Peanut butter (limit to 1 tablespoon at a time)  Dairy Milk*, drink 2% if tolerated to get more nutrients or lactose-free 2% milk Fortified non-dairy milks: almond, cashew, coconut, or rice (be aware that these options are not good sources of protein so you will need to eat an additional protein food) Fortified pea milk or soymilk (may cause gas and bloating for some) Instant breakfast* (pre-made lactose-free is sold in bottles) Milkshakes* (try blending in  to  cup canned fruit) Ice cream* (low-fat may be tolerated better; use in milkshakes to increase calories) Frozen yogurt Yogurt* Puddings and custard* Sherbet Liquid nutritional supplements with less than or equal to 2 grams fiber per 1 cup serving *Use lactose-free varieties to reduce gas and bloating  Vegetables Canned and well-cooked vegetables without seeds, skins or hulls Carrots, cooked Mashed potatoes (white, red or yellow) Sweet   potato  Fruit Canned, soft and well-cooked fruits without seeds, skins or membranes Applesauce Banana, mashed may be tolerated better Diced peaches/pears fruit cups in juice Melon, very soft, cut into small pieces Fruit nectar juices  Oils When possible choose oils rather than solid fats Canola or olive oil Margarine  Other Clear soup Gelatin Popsicles   Foods Not Recommended Food Group Foods Not  Recommended   Grains Bran Grains foods with 2 or more grams of fiber per serving: barley, brown rice, kasha, quinoa Popcorn Whole grain and high-fiber cereals, including oats or granola Whole grain bread or pasta  Protein Foods Fried meats, poultry or fish Sausage, bacon or hot dogs Seafood Tough meat, meat with gristle: steak, roast beef or pork chops Beans, peas or lentils Nuts  Dairy Cheese slices Liquid nutritional supplements that have more than 2 grams fiber per serving Pea milk, soymilk (may increase gas and bloating)  Vegetables Raw or undercooked vegetables Alfalfa, asparagus, bean sprouts, broccoli, brussels sprouts, cabbage, cauliflower, corn, green peas or any other kind of peas, lima beans, mushrooms, okra, onions, parsnips, peppers, pickles, potato skins, or spinach  Fruit Fresh fruit except for the ones in the foods recommended table Acidic fruit and juices: oranges/orange juice, grapefruit/grapefruit juice, tomatoes/tomato juice Avocado Berries Coconut Dried fruit Fruit skin Mandarin oranges Pineapple  Oils Fried foods of any type  Other Coffee Olives or pickles Pizza Salsa Sushi   Gastroparesis Sample 1-Day Menu  Breakfast 1 slice white toast (1 carbohydrate serving)  1 teaspoon margarine, soft, tub   cup egg substitute  1 cup peach nectar (2 carbohydrate servings)  Morning Snack Smoothie made with:  small banana (1 carbohydrate serving)  1/3 cup Greek strawberry yogurt ( carbohydrate serving)  1 cup 2% milk (1 carbohydrate serving)  Lunch 2 ounces canned chicken  1 teaspoon mayonnaise  9 saltine crackers (1 carbohydrate servings)   cup applesauce (1 carbohydrate serving)  Afternoon Snack 1 slice white toast (1 carbohydrate serving)  1 tablespoon smooth peanut butter  Evening Meal 2 ounces baked fish   cup mashed potatoes (1 carbohydrate serving)  1 teaspoon olive oil  1 cup 2% milk (1 carbohydrate serving)  Evening Snack 1 packet instant  breakfast (1 carbohydrate servings)  1 cup 2% milk (1 carbohydrate serving)   Gastroparesis Vegetarian (Lacto-Ovo) Sample 1-Day Menu  Breakfast  cup cooked farina (1 carbohydrate serving)   cup egg substitute  2 teaspoons olive oil   cup peach nectar (2 carbohydrate servings)   cup 2% milk ( carbohydrate serving)  Morning Snack 1 slice white toast (1 carbohydrate serving)  1 tablespoon smooth peanut butter  Lunch  cup vegetable soup (1 carbohydrate serving)  9 saltine crackers (1 carbohydrate serving)   cup applesauce (1 carbohydrate serving)   cup 2% milk ( carbohydrate serving)  Afternoon Snack 6 ounces plain yogurt (1 carbohydrate serving)   small banana (1 carbohydrate servings)  Evening Meal  cup baked tofu  2/3 cup white rice (2 carbohydrate servings)  2 teaspoons olive oil   cup 2% milk ( carbohydrate serving)  Evening Snack 1 packet instant breakfast (1 carbohydrate servings)  1 cup 2% milk (1 carbohydrate serving)   Gastroparesis Vegan Sample 1-Day Menu  Breakfast  cup cooked farina (1 carbohydrate serving)  1/3 cup tofu scramble  2 teaspoons olive oil   cup peach nectar (2 carbohydrate servings)   cup almond milk fortified with calcium, vitamin B12, and vitamin D  Morning Snack 1 slice white toast (1   carbohydrate serving)  1 tablespoon smooth peanut butter  Lunch  cup vegetable soup (1 carbohydrate serving)  9 saltine crackers (1 carbohydrate serving)   cup applesauce (1 carbohydrate serving)  Afternoon Snack 6 ounces plain soy yogurt (1 carbohydrate servings)   small banana (1 carbohydrate serving)  Evening Meal  cup baked tofu  2/3 cup white rice (2 carbohydrate servings)  2 teaspoons olive oil   cup almond milk fortified with calcium, vitamin B12, and vitamin D  Evening Snack  scoop soy protein powder ( carbohydrate serving)   cup almond milk fortified with calcium, vitamin B12, and vitamin D   Copyright 2020  [employer]  [employer]. All rights reserved.  

## 2022-09-09 NOTE — Progress Notes (Signed)
Mobility Specialist Progress Note    09/09/22 1044  Mobility  Activity Ambulated with assistance to bathroom;Ambulated with assistance in hallway  Level of Assistance Contact guard assist, steadying assist  Assistive Device Other (Comment) (IV pole)  Distance Ambulated (ft) 250 ft (10+240)  Activity Response Tolerated well  Mobility Referral Yes  $Mobility charge 1 Mobility   Pre-Mobility: 84 HR, 93% SpO2 During Mobility: 94 HR, 99% SpO2 Post-Mobility: 82 HR, 105/64 (77) BP, 95% SpO2  Pt received in bed and agreeable. Had diarrhea in BR. C/o 6/10 stomach pain and a little lightheaded and dizziness. Returned to chair with call bell in reach. Tolerated and left on RA.   Hildred Alamin Mobility Specialist  Please Psychologist, sport and exercise or Rehab Office at [phone]

## 2022-09-09 NOTE — Progress Notes (Signed)
Electrophysiology Office Note Date: 09/13/2022  ID:  [name], DOB 25-Jun-1971, MRN [id number]  PCP: [name], PA Primary Cardiologist: Werner Lean, MD Electrophysiologist: Megan Quitter, MD (Case was proctored by Dr. Curt Bears)  CC: Routine ICD follow-up  [name] is a 52 y.o. female seen today for Megan Quitter, MD for routine electrophysiology followup.   Multiple ER visits in past several weeks for her gastroparesis.   Since last being seen in our clinic the patient reports doing overall well. Her primary complaints are related to her gastroparesis, for which she has been referred to a specialist. She denies chest pain, palpitations, dyspnea, PND, orthopnea, nausea, vomiting, dizziness, syncope, edema, weight gain, or early satiety.   She has not had ICD shocks.   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  Past Medical History:  Diagnosis Date   Anemia    Anxiety    Arthritis    Asthma    CHF (congestive heart failure) (HCC)    Depression    Diabetic ulcer of left foot ([place]) 05/12/2013   Gastroparesis    Generalized abdominal pain    History of chicken pox    Loss of weight 12/02/2019   Migraines    Mixed hyperlipidemia 09/05/2022   Mood disorder (HCC)    anxiety   Prurigo nodularis    with diabetic dermopathy   Type 1 diabetes, uncontrolled, with neuropathy    [place]   Ulcers of both lower legs (HCC) 02/20/2014    Current Outpatient Medications  Medication Instructions   acetaminophen (TYLENOL) 500 MG tablet As needed   albuterol (PROAIR HFA) 108 (90 Base) MCG/ACT inhaler 2 puffs, Inhalation, Every 6 hours PRN   Continuous Blood Gluc Sensor (FREESTYLE LIBRE 2 SENSOR) MISC 1 Device, Subcutaneous, Every 14 days   Ensure Max Protein (ENSURE MAX PROTEIN) LIQD 11 oz, Oral, Daily   hydrALAZINE (APRESOLINE) 50 mg, Oral, 3 times daily   hydrOXYzine (ATARAX) 50 mg, Oral, 3 times  daily   insulin aspart (NOVOLOG) 150 Units, Subcutaneous, See admin instructions, Patient uses this with the Omnipod. Units vary according to blood sugar. Loads 150 units every 3 days.   LORazepam (ATIVAN) 0.5 mg, Oral, Every 8 hours PRN   metoCLOPramide (REGLAN) 10 mg, Oral, 3 times daily before meals   metoprolol succinate (TOPROL-XL) 50 mg, Oral, Daily, Take with or immediately following a meal.   Multiple Vitamin (MULTIVITAMIN WITH MINERALS) TABS tablet 1 tablet, Oral, Daily   nitroGLYCERIN (NITROSTAT) 0.4 mg, Sublingual, Every 5 min PRN   pantoprazole (PROTONIX) 40 mg, Oral, 2 times daily before meals   polyethylene glycol (MIRALAX / GLYCOLAX) 17 g, Oral, Daily PRN   pravastatin (PRAVACHOL) 40 mg, Oral, Every evening   pregabalin (LYRICA) 50 mg, Oral, 3 times daily PRN   prochlorperazine (COMPAZINE) 10 mg, Oral, Every 6 hours PRN   promethazine (PHENERGAN) 25 MG suppository Unwrap and place 1 suppository (25 mg total) rectally every 6 (six) hours as needed for nausea or vomiting.   promethazine (PHENERGAN) 12.5 mg, Oral, 2 times daily PRN   scopolamine (TRANSDERM-SCOP) 1.5 mg, Transdermal, every 72 hours   tobramycin (TOBREX) 0.3 % ophthalmic solution 1-2 drops, Both Eyes, See admin instructions, Instill 1-2 drops into both eyes 3 times daily the day before, day of, and the day after every 6 weeks eye injections per patient    torsemide (DEMADEX) 60 mg, Oral, Daily    Family History: Family History  Problem Relation Age of Onset   Diabetes Mother        type 2   Hypertension Mother    Thyroid disease Mother    Bipolar disorder Mother    Heart disease Mother    Calcium disorder Mother    Cancer Maternal Grandmother        Breast, stomach   Cancer Paternal Grandmother        stomach, lung (smoker)   Diabetes Paternal Grandmother    Diabetes Paternal Grandfather    Heart failure Sister    Diabetes Sister    Stroke Maternal Aunt    Cancer Maternal Uncle        prostate   CAD  Maternal Aunt        stents   Cancer Maternal Aunt 64       ovarian    Physical Exam: Vitals:   09/13/22 0925 09/13/22 0940  BP: (!) 188/108 (!) 152/98  Pulse: 97   SpO2: 99%   Weight: 177 lb (80.3 kg)   Height: 6' (1.829 m)      GEN- NAD. A&O x 3. Normal affect. HEENT: Normocephalic, atraumatic Lungs- CTAB, Normal effort.  Heart- Regular rate and rhythm rate and rhythm. No M/G/R.  Extremities- No peripheral edema. no clubbing or cyanosis Skin- warm and dry, no rash or lesion; ICD pocket well healed  ICD interrogation- reviewed in detail today,  See PACEART report  EKG is not ordered today  Other studies Reviewed: Additional studies/ records that were reviewed today include: Previous EP office notes.   Assessment and Plan:  1.  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 Chronic systolic CHF Echo 09/98 previously as low as 40-45% GDMT limited by CKD IV Ca score of 0 in 01/2020 Previously failed Entresto with hypotension.   3. HTN Elevated on arrival with some improvement on reheck.  Limited meds given ESRD.  She misses hydralazine middle dose several times a week, and only took her morning medications several minutes prior to her visit. Stressed importance of hydralazine compliance.   4. Gastroparesis The main driver of her symptoms.  Specialty consultation is pending.   5. ESRD Pending HD access/initiation per pt Stressed importance of early follow up for any infectious symptoms given transvenous device.   Current medicines are reviewed at length with the patient today.    Disposition:   Follow up with Dr. Myles Collins in 12 months  Signed, [name], PA-C  09/13/2022 9:55 AM  [place] [address] [place] [address] [phone] (office) [phone] (fax)

## 2022-09-09 NOTE — Progress Notes (Signed)
Progress Note    [name]   TBN:[id number]  DOB: 1970/08/16  DOA: 09/08/2022     1 PCP: Delma Officer, PA  Initial CC: N/V  Hospital Course: Ms. 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 She was just hospitalized 1/26 - 1/28 for gastroparesis flare and treated with anti-emetics, fluids and supportive care with improvement.  She again returns with N/V consistent with gastroparesis symptoms with a mild metabolic acidosis no AG.  She is admitted for similar treatment as last admission.   Interval History:  No events overnight.  Tolerating diet this morning but still having some nausea and diarrhea.  Assessment and Plan: * Intractable nausea and vomiting secondary to diabetic gastroparesis - No obvious precipitating factors.  Patient endorses typical symptoms consistent with prior gastroparesis flare -Continue scopolamine patch - Continue scheduled Reglan and Ativan - Continue as needed Phenergan and Compazine - Continue low rate fluids -Tolerated full liquids and has been advanced to carb diet this morning -Still some mild nausea with diarrhea.  Will monitor for further control of symptoms 1 more night  Metabolic acidosis - no AG; probably borderline trying to go into DKA with N/V on admission - continue IVF and SSI - BMP improving, still has acidosis this am; continue IVF - trend BMP  Chronic diastolic CHF (congestive heart failure) (HCC) NYHA I Stage C complicated by SCD s/p ICD  -Follows outpatient with cardiology and EP - No S/S exacerbation - Low rate fluids and monitor for any signs of volume overload.  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 Baseline creat ~ 3.5, eGFR 15 -Followed outpatient by nephrology.  Planning to undergo workup with vascular surgery in anticipation for eventual dialysis   Anemia of chronic disease - Baseline hemoglobin around 9 g/dL  Essential hypertension - Blood pressure currently elevated in the ER - Resume home regimen but hold torsemide in setting of in/V - PRN labetalol or hydralazine as well  CAD (coronary artery disease) - no epicardial disease (CAC 0) on 2021 stress test -Continue as needed NTG -Not on aspirin in setting of GI bleed history  Mixed hyperlipidemia - Continue statin   Old records reviewed in assessment of this patient  Antimicrobials:   DVT prophylaxis:  heparin injection 5,000 Units Start: 09/08/22 1400   Code Status:   Code Status: Full Code  Mobility Assessment (last 72 hours)     Mobility Assessment     Row Name 09/08/22 2000 09/08/22 1330         Does patient have an order for bedrest or is patient medically unstable No - Continue assessment No - Continue assessment      What is the highest level of mobility based on the progressive mobility assessment? Level 5 (Walks with assist in room/hall) - Balance while stepping forward/back and can walk in room with assist - Complete Level 2 (Chairfast) - Balance while sitting on edge of bed and cannot stand  Is the above level different from baseline mobility prior to current illness? -- Yes - Recommend PT order               Barriers to discharge:  Disposition Plan:  Home Tues Status is: Inpt  Objective: Blood pressure (!) 143/84, pulse 76, temperature 98.6 F (37 C), temperature source Oral, resp. rate 18, height 6' (1.829 m), weight 84.1 kg, last menstrual period 11/17/2021, SpO2 100 %.  Examination:   Physical Exam Constitutional:      General: She is not in acute distress.    Appearance: Normal appearance.  HENT:     Head: Normocephalic and atraumatic.     Mouth/Throat:     Mouth: Mucous membranes are moist.  Eyes:     Extraocular Movements: Extraocular movements intact.  Cardiovascular:     Rate and Rhythm: Normal rate and regular rhythm.  Pulmonary:     Effort: Pulmonary effort is normal.     Breath sounds: Normal breath sounds.  Abdominal:     General: Bowel sounds are normal. There is no distension.     Palpations: Abdomen is soft.     Tenderness: There is no abdominal tenderness.  Musculoskeletal:        General: Swelling present. Normal range of motion.     Cervical back: Normal range of motion and neck supple.     Comments: 1+ B/L LE edema  Skin:    General: Skin is warm and dry.  Neurological:     General: No focal deficit present.     Mental Status: She is alert and oriented to person, place, and time.  Psychiatric:        Mood and Affect: Mood normal.        Behavior: Behavior normal.      Consultants:    Procedures:    Data Reviewed: Results for orders placed or performed during the hospital encounter of 09/08/22 (from the past 24 hour(s))  Glucose, capillary     Status: Abnormal   Collection Time: 09/08/22  5:07 PM  Result Value Ref Range   Glucose-Capillary 166 (H) 70 - 99 mg/dL  Glucose, capillary     Status: Abnormal   Collection Time: 09/08/22  7:48 PM  Result Value Ref Range   Glucose-Capillary 236 (H) 70 - 99 mg/dL  Glucose, capillary     Status: Abnormal   Collection Time: 09/09/22 12:00 AM  Result Value Ref Range   Glucose-Capillary 134 (H) 70 - 99 mg/dL  Glucose, capillary     Status: Abnormal   Collection Time: 09/09/22  5:00 AM  Result Value Ref Range   Glucose-Capillary 189 (H) 70 - 99 mg/dL  Glucose, capillary     Status: Abnormal   Collection Time: 09/09/22  7:24 AM  Result Value Ref Range   Glucose-Capillary 170 (H) 70 - 99  mg/dL  Basic metabolic panel     Status: Abnormal   Collection Time: 09/09/22  7:27 AM  Result Value Ref Range   Sodium 134 (L) 135 - 145 mmol/L   Potassium 3.8 3.5 - 5.1 mmol/L   Chloride 105 98 - 111 mmol/L   CO2 21 (L) 22 - 32 mmol/L   Glucose, Bld 181 (H) 70 - 99 mg/dL   BUN 37 (H) 6 - 20 mg/dL   Creatinine, Ser 3.52 (H) 0.44 - 1.00 mg/dL   Calcium 8.0 (L) 8.9 - 10.3 mg/dL   GFR, Estimated 15 (L) >60 mL/min   Anion gap 8 5 -  15  CBC with Differential/Platelet     Status: Abnormal   Collection Time: 09/09/22  7:27 AM  Result Value Ref Range   WBC 6.4 4.0 - 10.5 K/uL   RBC 2.57 (L) 3.87 - 5.11 MIL/uL   Hemoglobin 7.4 (L) 12.0 - 15.0 g/dL   HCT 22.4 (L) 36.0 - 46.0 %   MCV 87.2 80.0 - 100.0 fL   MCH 28.8 26.0 - 34.0 pg   MCHC 33.0 30.0 - 36.0 g/dL   RDW 14.6 11.5 - 15.5 %   Platelets 212 150 - 400 K/uL   nRBC 0.0 0.0 - 0.2 %   Neutrophils Relative % 60 %   Neutro Abs 3.9 1.7 - 7.7 K/uL   Lymphocytes Relative 31 %   Lymphs Abs 2.0 0.7 - 4.0 K/uL   Monocytes Relative 6 %   Monocytes Absolute 0.4 0.1 - 1.0 K/uL   Eosinophils Relative 2 %   Eosinophils Absolute 0.1 0.0 - 0.5 K/uL   Basophils Relative 1 %   Basophils Absolute 0.0 0.0 - 0.1 K/uL   Immature Granulocytes 0 %   Abs Immature Granulocytes 0.02 0.00 - 0.07 K/uL  Magnesium     Status: None   Collection Time: 09/09/22  7:27 AM  Result Value Ref Range   Magnesium 1.8 1.7 - 2.4 mg/dL  Glucose, capillary     Status: Abnormal   Collection Time: 09/09/22 11:20 AM  Result Value Ref Range   Glucose-Capillary 256 (H) 70 - 99 mg/dL    I have reviewed pertinent nursing notes, vitals, labs, and images as necessary. I have ordered labwork to follow up on as indicated.  I have reviewed the last notes from staff over past 24 hours. I have discussed patient's care plan and test results with nursing staff, CM/SW, and other staff as appropriate.  Time spent: Greater than 50% of the 55 minute visit was spent in  counseling/coordination of care for the patient as laid out in the A&P.   LOS: 1 day   Dwyane Dee, MD [facility] 09/09/2022, 2:03 PM

## 2022-09-09 NOTE — Inpatient Diabetes Management (Signed)
Inpatient Diabetes Program Recommendations  AACE/ADA: New Consensus Statement on Inpatient Glycemic Control (2015)  Target Ranges:  Prepandial:   less than 140 mg/dL      Peak postprandial:   less than 180 mg/dL (1-2 hours)      Critically ill patients:  140 - 180 mg/dL   Lab Results  Component Value Date   GLUCAP 256 (H) 09/09/2022   HGBA1C 6.4 (H) 07/12/2022    Latest Reference Range & Units 09/09/22 00:00 09/09/22 05:00 09/09/22 07:24 09/09/22 11:20  Glucose-Capillary 70 - 99 mg/dL 134 (H) 189 (H) 170 (H) 256 (H)  (H): Data is abnormally high  Diabetes history: type 1 Outpatient Diabetes medications: Omnipod insulin pump with Novolog(Total basal= 24 units), Tresiba 50 units daily if not using pump; FreeStyle Libre3 CGM  Current orders for Inpatient glycemic control: Novolog 0-9 units correction scale q 4 hrs.  Inpatient Diabetes Program Recommendations:   While insulin pump is off: -Add Levemir 6 units qd -Decrease Novolog correction to 0-6 units tid, 0-5 units hs since patient is now eating  Thank you, Nani Gasser. Vick Filter, RN, MSN, CDE  Diabetes Coordinator Inpatient Glycemic Control Team Team Pager [phone] (8am-5pm) 09/09/2022 12:22 PM

## 2022-09-09 NOTE — Progress Notes (Signed)
Initial Nutrition Assessment  DOCUMENTATION CODES:   Not applicable  INTERVENTION:  Liberalize diet from a heart healthy/carb modified to a carb modified diet to provide a wider variety of menu options to enhance nutritional adequacy "Gastroparesis Nutrition Therapy" handout added to AVS MVI with minerals daily Encourage smaller more frequent meals  Snacks TID to optimize PO itnake Pears and peaches, peanut butter with crackers, tuna or chicken salad in a cup  NUTRITION DIAGNOSIS:   Inadequate oral intake related to altered GI function, nausea, vomiting as evidenced by per patient/family report.  GOAL:   Patient will meet greater than or equal to 90% of their needs  MONITOR:   PO intake, Supplement acceptance, Diet advancement, Labs, Weight trends  REASON FOR ASSESSMENT:   Malnutrition Screening Tool    ASSESSMENT:   Pt admitted with intractable n/v d/t diabetic gastroparesis. PMH significant for T1DM, dCHF, diabetic gastroparesis, HTN, CKD 4, VT/VF cardiac arrest s/p CPR with ICD 04/2022 and ACD.  Pt reports having poor PO intake with n/v that comes and goes. She states that at home she has only been able to tolerate small snacks throughout the day including tuna and chicken salad.   She does not drink sodas, eat spicy foods and feels at though she cannot tolerate milk based products. She has tried ensure but this causes emesis and she does not like boost breeze. She endorses intolerance to fruits as these have caused bloating.   Pt states that her n/v have improved today. She 75% of her breakfast today which included an omelette and breakfast potatoes.   She reports having a visit scheduled with a Gastro MD in [place] the end of this month which she is looking forward to as she is uncertain what she can and cannot eat. Briefly discussed nutrition recommendations for gastroparesis including smaller, more frequent meals, low fat foods, and low fiber foods. Handout added to AVS  to assist with nutrition options once discharged.   Pt endorses having recent weight loss which she report has been about 25 lbs within the last ~4 months. Reviewed weight history. Pt's weight noted to have declined 3 kg in October however had since increased up to 87.5 kg on 01/02. Pt's weight since then has fluctuated up and down . Current weight noted to be 84.1 kg.   Medications: SSI 0-9 units q4h, reglan, protonix IV drips: LR @ 43ml/hr  Labs: sodium 134, BUN 37, Cr 3.52, GFR 15, HgbA1c 6.4%, CBG's 134-256 x24 hours   NUTRITION - FOCUSED PHYSICAL EXAM:  Flowsheet Row Most Recent Value  Orbital Region No depletion  Upper Arm Region No depletion  Thoracic and Lumbar Region No depletion  Buccal Region No depletion  Temple Region No depletion  Clavicle Bone Region No depletion  Clavicle and Acromion Bone Region No depletion  Scapular Bone Region No depletion  Dorsal Hand No depletion  Patellar Region No depletion  Anterior Thigh Region No depletion  Posterior Calf Region No depletion  Edema (RD Assessment) None  Hair Other (Comment)  [falling out]  Eyes Reviewed  Mouth Reviewed  Skin Reviewed  Nails Reviewed      Diet Order:   Diet Order             Diet Carb Modified Fluid consistency: Thin; Room service appropriate? Yes  Diet effective now                  EDUCATION NEEDS:   Education needs have been addressed  Skin:  Skin  Assessment: Reviewed RN Assessment  Last BM:  2/5 (type 4)  Height:   Ht Readings from Last 1 Encounters:  09/08/22 6' (1.829 m)    Weight:   Wt Readings from Last 1 Encounters:  09/09/22 84.1 kg   BMI:  Body mass index is 25.16 kg/m.  Estimated Nutritional Needs:   Kcal:  2000-2200  Protein:  105-120g  Fluid:  >/=2L  Clayborne Dana, RDN, LDN Clinical Nutrition

## 2022-09-10 DIAGNOSIS — R112 Nausea with vomiting, unspecified: Secondary | ICD-10-CM | POA: Diagnosis not present

## 2022-09-10 DIAGNOSIS — I5032 Chronic diastolic (congestive) heart failure: Secondary | ICD-10-CM | POA: Diagnosis not present

## 2022-09-10 LAB — BASIC METABOLIC PANEL
Anion gap: 9 (ref 5–15)
BUN: 42 mg/dL — ABNORMAL HIGH (ref 6–20)
CO2: 19 mmol/L — ABNORMAL LOW (ref 22–32)
Calcium: 7.9 mg/dL — ABNORMAL LOW (ref 8.9–10.3)
Chloride: 107 mmol/L (ref 98–111)
Creatinine, Ser: 3.87 mg/dL — ABNORMAL HIGH (ref 0.44–1.00)
GFR, Estimated: 13 mL/min — ABNORMAL LOW (ref >60)
Glucose, Bld: 149 mg/dL — ABNORMAL HIGH (ref 70–99)
Potassium: 3.5 mmol/L (ref 3.5–5.1)
Sodium: 135 mmol/L (ref 135–145)

## 2022-09-10 LAB — CBC WITH DIFFERENTIAL/PLATELET
Abs Immature Granulocytes: 0.03 10*3/uL (ref 0.00–0.07)
Basophils Absolute: 0 10*3/uL (ref 0.0–0.1)
Basophils Relative: 0 %
Eosinophils Absolute: 0.1 10*3/uL (ref 0.0–0.5)
Eosinophils Relative: 2 %
HCT: 22.1 % — ABNORMAL LOW (ref 36.0–46.0)
Hemoglobin: 7.4 g/dL — ABNORMAL LOW (ref 12.0–15.0)
Immature Granulocytes: 1 %
Lymphocytes Relative: 30 %
Lymphs Abs: 1.9 10*3/uL (ref 0.7–4.0)
MCH: 28.9 pg (ref 26.0–34.0)
MCHC: 33.5 g/dL (ref 30.0–36.0)
MCV: 86.3 fL (ref 80.0–100.0)
Monocytes Absolute: 0.4 10*3/uL (ref 0.1–1.0)
Monocytes Relative: 7 %
Neutro Abs: 3.9 10*3/uL (ref 1.7–7.7)
Neutrophils Relative %: 60 %
Platelets: 213 10*3/uL (ref 150–400)
RBC: 2.56 MIL/uL — ABNORMAL LOW (ref 3.87–5.11)
RDW: 14.7 % (ref 11.5–15.5)
WBC: 6.3 10*3/uL (ref 4.0–10.5)
nRBC: 0 % (ref 0.0–0.2)

## 2022-09-10 LAB — MAGNESIUM: Magnesium: 1.8 mg/dL (ref 1.7–2.4)

## 2022-09-10 LAB — GLUCOSE, CAPILLARY
Glucose-Capillary: 210 mg/dL — ABNORMAL HIGH (ref 70–99)
Glucose-Capillary: 242 mg/dL — ABNORMAL HIGH (ref 70–99)

## 2022-09-10 MED ORDER — TORSEMIDE 20 MG PO TABS
20.0000 mg | ORAL_TABLET | Freq: Three times a day (TID) | ORAL | Status: DC
  Administered 2022-09-10: 20 mg via ORAL
  Filled 2022-09-10: qty 1

## 2022-09-10 MED ORDER — ALUM & MAG HYDROXIDE-SIMETH 200-200-20 MG/5ML PO SUSP: 30.0000 mL | ORAL | Status: DC | PRN

## 2022-09-10 NOTE — Plan of Care (Signed)

## 2022-09-10 NOTE — Progress Notes (Signed)
Remote ICD transmission.   

## 2022-09-10 NOTE — Progress Notes (Signed)
Mobility Specialist Progress Note    09/10/22 1239  Mobility  Activity Ambulated independently in hallway  Level of Assistance Standby assist, set-up cues, supervision of patient - no hands on  Assistive Device None  Distance Ambulated (ft) 200 ft  Activity Response Tolerated well  $Mobility charge 1 Mobility   Pt received sitting EOB and agreeable. No complaints on walk. Returned to sitting EOB with call bell in reach.   Hildred Alamin Mobility Specialist  Please Psychologist, sport and exercise or Rehab Office at [phone]

## 2022-09-10 NOTE — Discharge Summary (Signed)
Physician Discharge Summary   [name] HFG:[id number] DOB: [birth year] DOA: 09/08/2022  PCP: [name], PA  Admit date: 09/08/2022 Discharge date: 09/10/2022   Admitted From: Home Disposition:  Home Discharging physician: Dwyane Dee, MD Barriers to discharge: none  Recommendations for Outpatient Follow-up:  Continue routine care  Home Health:  Equipment/Devices:   Discharge Condition: stable CODE STATUS: Full Diet recommendation:  Diet Orders (From admission, onward)     Start     Ordered   09/10/22 0000  Diet Carb Modified        09/10/22 1142   09/09/22 1356  Diet Carb Modified Fluid consistency: Thin; Room service appropriate? Yes  Diet effective now       Question Answer Comment  Diet-HS Snack? Nothing   Calorie Level Medium 1600-2000   Fluid consistency: Thin   Room service appropriate? Yes      09/09/22 1356            Hospital Course: Ms. 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 She was just hospitalized 1/26 - 1/28 for gastroparesis flare and treated with anti-emetics, fluids and supportive care with improvement.  She again returns with N/V consistent with gastroparesis symptoms with a mild metabolic acidosis no AG.  She is admitted for similar treatment as last admission.  See below for further A&P.  Assessment and Plan: * Intractable nausea and vomiting secondary to diabetic gastroparesis - No obvious precipitating factors.  Patient endorses typical symptoms consistent with prior gastroparesis flare -Patient responded well to scopolamine patch and scheduled Reglan, Ativan along with as needed Phenergan and Compazine - Treated with fluids while diet was being advanced  Metabolic acidosis - no AG; probably borderline trying to go into DKA with N/V on admission - s/p IVF and insulin  Chronic diastolic CHF (congestive heart  failure) (HCC) NYHA I Stage C complicated by SCD s/p ICD  -Follows outpatient with cardiology and EP - No S/S exacerbation - Low rate fluids and monitored for any signs of volume overload.  Mild edema appreciated in legs  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) - Last admission treated with 10 units basal insulin and SSI - Does have insulin pump use at home which she has turned off for admission -CBGs controlled during hospitalization.  Discharged on home insulin pump  CKD (chronic kidney disease) stage 4, GFR 15-29 ml/min (HCC) - patient has history of CKD4. Baseline creat ~ 3.5, eGFR 15 -Followed outpatient by nephrology.  Planning to undergo workup with vascular surgery in anticipation for eventual dialysis   Anemia of chronic disease - Baseline hemoglobin around 9 g/dL  Essential hypertension - Blood pressure currently elevated in the ER - Resumed home regimen but held torsemide in setting of N/V -Torsemide resumed at discharge  CAD (coronary artery disease) - no epicardial disease (CAC 0) on 2021 stress test -Continue as needed NTG -Not on aspirin in setting of GI bleed history  Mixed hyperlipidemia - Continue statin   The patient's chronic medical conditions were treated accordingly per the patient's home medication regimen except as noted.  On day of discharge, patient was felt deemed stable for discharge. Patient/family member advised to call PCP or come back to ER if needed.   Principal Diagnosis: Intractable nausea and vomiting  Discharge Diagnoses: Active Hospital Problems   Diagnosis Date Noted   Intractable nausea and vomiting secondary  to diabetic gastroparesis 04/23/2022    Priority: 1.   Metabolic acidosis 30/12/1100    Priority: 2.   Chronic diastolic CHF (congestive heart failure) ([place]) 12/14/2019    Priority: 2.   Type 1 diabetes mellitus with diabetic neuropathy ([place]) 06/18/2007    Priority: 3.   CKD (chronic kidney disease) stage 4,  GFR 15-29 ml/min (HCC) 07/23/2022    Priority: 4.   Anemia of chronic disease     Priority: 5.   Essential hypertension 04/05/2022    Priority: 6.   CAD (coronary artery disease) 09/08/2022   Mixed hyperlipidemia 09/05/2022    Resolved Hospital Problems  No resolved problems to display.     Discharge Instructions     Diet Carb Modified   Complete by: As directed    Increase activity slowly   Complete by: As directed       Allergies as of 09/10/2022       Reactions   Atorvastatin Other (See Comments)   Dizziness    Dulaglutide Nausea And Vomiting, Other (See Comments)   Caused pancreatitis    Sertraline    Other Reaction(s): vomiting   Zofran [ondansetron] Nausea And Vomiting, Other (See Comments)   Causes nausea vomiting to worsen / doesn't really work for patient.   Amoxicillin Itching, Rash   Lantus [insulin Glargine] Itching, Rash   Miconazole Nitrate Nausea And Vomiting, Rash        Medication List     TAKE these medications    acetaminophen 500 MG tablet Commonly known as: TYLENOL as needed for moderate pain or mild pain.   albuterol 108 (90 Base) MCG/ACT inhaler Commonly known as: ProAir HFA Inhale 2 puffs into the lungs every 6 (six) hours as needed for wheezing or shortness of breath.   Ensure Max Protein Liqd Take 330 mLs (11 oz total) by mouth daily.   FreeStyle Libre 2 Sensor Misc Inject 1 Device into the skin every 14 (fourteen) days.   hydrALAZINE 50 MG tablet Commonly known as: APRESOLINE Take 1 tablet (50 mg total) by mouth 3 (three) times daily.   hydrOXYzine 50 MG tablet Commonly known as: ATARAX Take 50 mg by mouth in the morning, at noon, and at bedtime.   insulin aspart 100 UNIT/ML injection Commonly known as: novoLOG Inject 150 Units into the skin See admin instructions. Patient uses this with the [place]. Units vary according to blood sugar. Loads 150 units every 3 days.   LORazepam 0.5 MG tablet Commonly known as:  ATIVAN Take 1 tablet (0.5 mg total) by mouth every 8 (eight) hours as needed for anxiety (Refractory nausea and vomiting).   metoCLOPramide 10 MG tablet Commonly known as: Reglan Take 1 tablet (10 mg total) by mouth 3 (three) times daily before meals.   metoprolol succinate 50 MG 24 hr tablet Commonly known as: TOPROL-XL Take 1 tablet (50 mg total) by mouth daily. Take with or immediately following a meal.   multivitamin with minerals Tabs tablet Take 1 tablet by mouth daily.   nitroGLYCERIN 0.4 MG SL tablet Commonly known as: NITROSTAT Place 1 tablet (0.4 mg total) under the tongue every 5 (five) minutes as needed for chest pain.   pantoprazole 40 MG tablet Commonly known as: Protonix Take 1 tablet (40 mg total) by mouth 2 (two) times daily before a meal.   polyethylene glycol 17 g packet Commonly known as: MIRALAX / GLYCOLAX Take 17 g by mouth daily as needed for moderate constipation.   pravastatin 40 MG  tablet Commonly known as: PRAVACHOL Take 1 tablet (40 mg total) by mouth every evening.   pregabalin 50 MG capsule Commonly known as: LYRICA Take 50 mg by mouth 3 (three) times daily as needed (neuropathy).   prochlorperazine 10 MG tablet Commonly known as: COMPAZINE Take 1 tablet (10 mg total) by mouth every 6 (six) hours as needed for nausea or vomiting.   promethazine 12.5 MG tablet Commonly known as: PHENERGAN Take 12.5 mg by mouth 2 (two) times daily as needed for nausea or vomiting.   promethazine 25 MG suppository Commonly known as: PHENERGAN Unwrap and place 1 suppository (25 mg total) rectally every 6 (six) hours as needed for nausea or vomiting.   scopolamine 1 MG/3DAYS Commonly known as: TRANSDERM-SCOP Place 1 patch (1.5 mg total) onto the skin every 3 (three) days.   tobramycin 0.3 % ophthalmic solution Commonly known as: TOBREX Place 1-2 drops into both eyes See admin instructions. Instill 1-2 drops into both eyes 3 times daily the day before, day  of, and the day after every 6 weeks eye injections per patient   torsemide 20 MG tablet Commonly known as: DEMADEX TAKE THREE TABLETS BY MOUTH DAILY        Follow-up Information     [name], PA Follow up.   Specialty: Internal Medicine Contact information: [address] [name] [address] [place] [address] [phone]                Allergies  Allergen Reactions   Atorvastatin Other (See Comments)    Dizziness    Dulaglutide Nausea And Vomiting and Other (See Comments)    Caused pancreatitis    Sertraline     Other Reaction(s): vomiting   Zofran [Ondansetron] Nausea And Vomiting and Other (See Comments)    Causes nausea vomiting to worsen / doesn't really work for patient.   Amoxicillin Itching and Rash   Lantus [Insulin Glargine] Itching and Rash   Miconazole Nitrate Nausea And Vomiting and Rash    Consultations:   Procedures:   Discharge Exam: BP 134/75 (BP Location: Right Arm)   Pulse 84   Temp 98.2 F (36.8 C) (Oral)   Resp 19   Ht 6' (1.829 m)   Wt 84.1 kg   LMP 11/17/2021 (Approximate)   SpO2 92%   BMI 25.16 kg/m  Physical Exam Constitutional:      General: She is not in acute distress.    Appearance: Normal appearance.  HENT:     Head: Normocephalic and atraumatic.     Mouth/Throat:     Mouth: Mucous membranes are moist.  Eyes:     Extraocular Movements: Extraocular movements intact.  Cardiovascular:     Rate and Rhythm: Normal rate and regular rhythm.  Pulmonary:     Effort: Pulmonary effort is normal.     Breath sounds: Normal breath sounds.  Abdominal:     General: Bowel sounds are normal. There is no distension.     Palpations: Abdomen is soft.     Tenderness: There is no abdominal tenderness.  Musculoskeletal:        General: Swelling present. Normal range of motion.     Cervical back: Normal range of motion and neck supple.     Comments: 1+ B/L LE edema  Skin:    General: Skin is warm and dry.  Neurological:      General: No focal deficit present.     Mental Status: She is alert and oriented to person, place,  and time.  Psychiatric:        Mood and Affect: Mood normal.        Behavior: Behavior normal.      The results of significant diagnostics from this hospitalization (including imaging, microbiology, ancillary and laboratory) are listed below for reference.   Microbiology: No results found for this or any previous visit (from the past 240 hour(s)).   Labs: BNP (last 3 results) Recent Labs    07/22/22 2121 08/06/22 1235 08/31/22 0036  BNP 698.6* 819.2* 6,962.9*   Basic Metabolic Panel: Recent Labs  Lab 09/08/22 0726 09/09/22 0727 09/10/22 0319  NA 136 134* 135  K 3.9 3.8 3.5  CL 106 105 107  CO2 19* 21* 19*  GLUCOSE 453* 181* 149*  BUN 40* 37* 42*  CREATININE 3.18* 3.52* 3.87*  CALCIUM 8.5* 8.0* 7.9*  MG 1.8 1.8 1.8   Liver Function Tests: Recent Labs  Lab 09/08/22 0726  AST 14*  ALT 16  ALKPHOS 76  BILITOT 1.2  PROT 5.9*  ALBUMIN 2.7*   Recent Labs  Lab 09/08/22 0726  LIPASE 27   No results for input(s): "AMMONIA" in the last 168 hours. CBC: Recent Labs  Lab 09/06/22 1153 09/08/22 0844 09/09/22 0727 09/10/22 0319  WBC  --  8.3 6.4 6.3  NEUTROABS  --  7.3 3.9 3.9  HGB 9.0* 8.6* 7.4* 7.4*  HCT  --  26.0* 22.4* 22.1*  MCV  --  87.0 87.2 86.3  PLT  --  237 212 213   Cardiac Enzymes: No results for input(s): "CKTOTAL", "CKMB", "CKMBINDEX", "TROPONINI" in the last 168 hours. BNP: Invalid input(s): "POCBNP" CBG: Recent Labs  Lab 09/09/22 1120 09/09/22 1622 09/09/22 2139 09/10/22 0746 09/10/22 1117  GLUCAP 256* 182* 338* 210* 242*   D-Dimer No results for input(s): "DDIMER" in the last 72 hours. Hgb A1c No results for input(s): "HGBA1C" in the last 72 hours. Lipid Profile No results for input(s): "CHOL", "HDL", "LDLCALC", "TRIG", "CHOLHDL", "LDLDIRECT" in the last 72 hours. Thyroid function studies No results for input(s): "TSH",  "T4TOTAL", "T3FREE", "THYROIDAB" in the last 72 hours.  Invalid input(s): "FREET3" Anemia work up No results for input(s): "VITAMINB12", "FOLATE", "FERRITIN", "TIBC", "IRON", "RETICCTPCT" in the last 72 hours. Urinalysis    Component Value Date/Time   COLORURINE YELLOW 08/30/2022 1410   APPEARANCEUR CLEAR 08/30/2022 1410   LABSPEC 1.018 08/30/2022 1410   PHURINE 7.0 08/30/2022 1410   GLUCOSEU 500 (A) 08/30/2022 1410   HGBUR SMALL (A) 08/30/2022 1410   BILIRUBINUR NEGATIVE 08/30/2022 1410   BILIRUBINUR negative 08/30/2020 1631   KETONESUR TRACE (A) 08/30/2022 1410   PROTEINUR >300 (A) 08/30/2022 1410   UROBILINOGEN 0.2 08/30/2020 1631   UROBILINOGEN 1.0 08/11/2008 1745   NITRITE NEGATIVE 08/30/2022 1410   LEUKOCYTESUR NEGATIVE 08/30/2022 1410   Sepsis Labs Recent Labs  Lab 09/08/22 0844 09/09/22 0727 09/10/22 0319  WBC 8.3 6.4 6.3   Microbiology No results found for this or any previous visit (from the past 240 hour(s)).  Procedures/Studies: DG CHEST PORT 1 VIEW  Result Date: 09/01/2022 CLINICAL DATA:  Chest pain. EXAM: PORTABLE CHEST 1 VIEW COMPARISON:  08/30/2022 FINDINGS: Stable enlarged cardiac silhouette and left subclavian AICD lead. Stable minimal linear atelectasis or scarring in the left mid and right upper lung zone. Interval minimal patchy density at both lung bases. Small bilateral pleural effusions, slightly increased. Unremarkable bones. IMPRESSION: 1. Interval minimal patchy atelectasis or pneumonia at both lung bases. 2. Small bilateral pleural effusions, slightly increased.  3. Stable cardiomegaly. Electronically Signed   By: Claudie Revering M.D.   On: 09/01/2022 11:56   DG CHEST PORT 1 VIEW  Result Date: 08/30/2022 CLINICAL DATA:  Acute on chronic diastolic CHF EXAM: PORTABLE CHEST 1 VIEW COMPARISON:  08/27/2022 FINDINGS: Stable cardiomegaly.  Left chest wall ICD. Pulmonary vascular congestion. Retrocardiac atelectasis. Possible small left pleural effusion. No  pneumothorax. No acute osseous abnormality. IMPRESSION: Cardiomegaly and pulmonary vascular congestion. Possible small left pleural effusion. Electronically Signed   By: Placido Sou M.D.   On: 08/30/2022 23:50   DG Chest Portable 1 View  Result Date: 08/27/2022 CLINICAL DATA:  Vomiting. EXAM: PORTABLE CHEST 1 VIEW COMPARISON:  07/22/2022. FINDINGS: 1444 hours. Left chest AICD in unchanged position with leads projecting over the right ventricle. Low lung volumes accentuate the pulmonary vasculature and cardiomediastinal silhouette. No focal airspace opacity. Mild peribronchial thickening. Stable cardiomegaly and mediastinal contours. No pleural effusion or pneumothorax. IMPRESSION: Low lung volumes with mild peribronchial thickening. No focal airspace opacity. Electronically Signed   By: Emmit Alexanders M.D.   On: 08/27/2022 15:02   DG Abd Portable 2 Views  Result Date: 08/27/2022 CLINICAL DATA:  Vomiting. EXAM: PORTABLE ABDOMEN - 2 VIEW COMPARISON:  08/23/2022 FINDINGS: Artifact overlies the abdomen. There is no evidence of ileus, obstruction or visible free air. Relative paucity of bowel contents is noted. No abnormal calcifications or significant bone findings. IMPRESSION: No acute finding. Relative paucity of bowel contents. Electronically Signed   By: Nelson Chimes M.D.   On: 08/27/2022 15:00   DG Abdomen 1 View  Result Date: 08/23/2022 CLINICAL DATA:  Intractable vomiting EXAM: ABDOMEN - 1 VIEW COMPARISON:  07/21/2022 FINDINGS: Inferior pelvis excluded from view. Normal abdominal gas pattern. No gross free intraperitoneal gas. Stable phleboliths within the pelvis bilaterally. No definite renal or ureteral calculi. Osseous structures are unremarkable. IMPRESSION: 1. Negative. Electronically Signed   By: Fidela Salisbury M.D.   On: 08/23/2022 19:49     Time coordinating discharge: Over 30 minutes    Dwyane Dee, MD  [facility] 09/10/2022, 1:41 PM

## 2022-09-10 NOTE — Inpatient Diabetes Management (Signed)
Inpatient Diabetes Program Recommendations  AACE/ADA: New Consensus Statement on Inpatient Glycemic Control (2015)  Target Ranges:  Prepandial:   less than 140 mg/dL      Peak postprandial:   less than 180 mg/dL (1-2 hours)      Critically ill patients:  140 - 180 mg/dL   Lab Results  Component Value Date   GLUCAP 210 (H) 09/10/2022   HGBA1C 6.4 (H) 07/12/2022    Review of Glycemic Control  Latest Reference Range & Units 09/09/22 11:20 09/09/22 16:22 09/09/22 21:39 09/10/22 07:46  Glucose-Capillary 70 - 99 mg/dL 256 (H) 182 (H) 338 (H) 210 (H)  (H): Data is abnormally high  Diabetes history: type 1 Outpatient Diabetes medications: Omnipod insulin pump with Novolog(Total basal= 24 units), Tresiba 50 units daily if not using pump; FreeStyle Libre3 CGM  Current orders for Inpatient glycemic control: Novolog 0-9 units correction scale TID & HS, Levemir 6 units QD   Inpatient Diabetes Program Recommendations:    If patient to remain inpatient, consider adding Novolog 3 units TID (assuming patient consuming >50% of meals) and increase Levemir to 10 units QD.  Thanks, Bronson Curb, MSN, RNC-OB Diabetes Coordinator [phone] (8a-5p)

## 2022-09-13 ENCOUNTER — Encounter: Payer: Self-pay | Admitting: Student

## 2022-09-13 ENCOUNTER — Encounter (INDEPENDENT_AMBULATORY_CARE_PROVIDER_SITE_OTHER): Payer: BC Managed Care – PPO | Admitting: Student

## 2022-09-13 VITALS — BP 152/98 | HR 97 | Ht 72.0 in | Wt 177.0 lb

## 2022-09-13 DIAGNOSIS — I472 Ventricular tachycardia, unspecified: Secondary | ICD-10-CM

## 2022-09-13 DIAGNOSIS — I10 Essential (primary) hypertension: Secondary | ICD-10-CM

## 2022-09-13 LAB — CUP PACEART INCLINIC DEVICE CHECK
Battery Remaining Longevity: 131 mo
Battery Voltage: 3.05 V
Brady Statistic RV Percent Paced: 0 %
Date Time Interrogation Session: 20240209094651
HighPow Impedance: 54 Ohm
Implantable Lead Connection Status: 753985
Implantable Lead Implant Date: 20230905
Implantable Lead Location: 753860
Implantable Pulse Generator Implant Date: 20230905
Lead Channel Impedance Value: 285 Ohm
Lead Channel Impedance Value: 342 Ohm
Lead Channel Pacing Threshold Amplitude: 0.75 V
Lead Channel Pacing Threshold Pulse Width: 0.4 ms
Lead Channel Sensing Intrinsic Amplitude: 4 mV
Lead Channel Sensing Intrinsic Amplitude: 4.25 mV
Lead Channel Setting Pacing Amplitude: 2 V
Lead Channel Setting Pacing Pulse Width: 0.4 ms
Lead Channel Setting Sensing Sensitivity: 0.3 mV
Zone Setting Status: 755011
Zone Setting Status: 755011

## 2022-09-13 NOTE — Patient Instructions (Signed)
Medication Instructions:  Your physician recommends that you continue on your current medications as directed. Please refer to the Current Medication list given to you today.  *If you need a refill on your cardiac medications before your next appointment, please call your pharmacy*  Lab Work: None  Testing/Procedures: None  Follow-Up: At [facility], you and your health needs are our priority.  As part of our continuing mission to provide you with exceptional heart care, we have created designated Provider Care Teams.  These Care Teams include your primary Cardiologist (physician) and Advanced Practice Providers (APPs -  Physician Assistants and Nurse Practitioners) who all work together to provide you with the care you need, when you need it.  Your next appointment:   1 year(s)  Provider:   Doralee Albino, MD

## 2022-09-19 ENCOUNTER — Telehealth: Payer: Self-pay

## 2022-09-19 DIAGNOSIS — N184 Chronic kidney disease, stage 4 (severe): Secondary | ICD-10-CM | POA: Diagnosis not present

## 2022-09-19 DIAGNOSIS — D638 Anemia in other chronic diseases classified elsewhere: Secondary | ICD-10-CM | POA: Diagnosis not present

## 2022-09-19 DIAGNOSIS — E1122 Type 2 diabetes mellitus with diabetic chronic kidney disease: Secondary | ICD-10-CM | POA: Diagnosis not present

## 2022-09-19 NOTE — Telephone Encounter (Signed)
[name] with [facility] called stating that the pt was ready to proceed with AVG surgery due to kidney function.   Reviewed pt's chart, returned call for clarification, two identifiers used. Informed her that her information would be sent to [facility], RN in the surgery scheduling department to call her and get that scheduled. Confirmed understanding.

## 2022-09-20 ENCOUNTER — Other Ambulatory Visit: Payer: Self-pay

## 2022-09-20 ENCOUNTER — Encounter (HOSPITAL_COMMUNITY)
Admission: RE | Admit: 2022-09-20 | Discharge: 2022-09-20 | Disposition: A | Discharge status: Home / Self Care | Payer: BC Managed Care – PPO | Source: Ambulatory Visit | Attending: Nephrology | Admitting: Nephrology

## 2022-09-20 VITALS — BP 117/111 | HR 96 | Temp 97.5°F | Resp 17

## 2022-09-20 DIAGNOSIS — N184 Chronic kidney disease, stage 4 (severe): Secondary | ICD-10-CM

## 2022-09-20 DIAGNOSIS — I472 Ventricular tachycardia, unspecified: Secondary | ICD-10-CM | POA: Diagnosis not present

## 2022-09-20 DIAGNOSIS — I10 Essential (primary) hypertension: Secondary | ICD-10-CM | POA: Diagnosis not present

## 2022-09-20 LAB — POCT HEMOGLOBIN-HEMACUE: Hemoglobin: 8.6 g/dL — ABNORMAL LOW (ref 12.0–15.0)

## 2022-09-20 MED ORDER — EPOETIN ALFA-EPBX 10000 UNIT/ML IJ SOLN: 10000.0000 [IU] | INTRAMUSCULAR | Status: DC

## 2022-09-20 MED ORDER — EPOETIN ALFA-EPBX 10000 UNIT/ML IJ SOLN
INTRAMUSCULAR | Status: AC
  Administered 2022-09-20: 10000 [IU] via SUBCUTANEOUS
  Filled 2022-09-20: qty 1

## 2022-09-20 MED ORDER — SODIUM CHLORIDE 0.9 % IV SOLN
510.0000 mg | INTRAVENOUS | Status: AC
  Administered 2022-09-20: 510 mg via INTRAVENOUS
  Filled 2022-09-20: qty 510

## 2022-09-25 ENCOUNTER — Telehealth: Payer: Self-pay

## 2022-09-25 NOTE — Telephone Encounter (Signed)
Received a telephone call from [place] at [facility] advising that patient does not need a tunneled dialysis cath right now, just an AV Graft placement.

## 2022-09-27 ENCOUNTER — Encounter (HOSPITAL_COMMUNITY): Payer: Self-pay

## 2022-09-27 ENCOUNTER — Encounter (HOSPITAL_COMMUNITY)
Admission: RE | Admit: 2022-09-27 | Discharge: 2022-09-27 | Disposition: A | Discharge status: Home / Self Care | Payer: BC Managed Care – PPO | Source: Ambulatory Visit | Attending: Vascular Surgery | Admitting: Vascular Surgery

## 2022-09-27 DIAGNOSIS — R112 Nausea with vomiting, unspecified: Secondary | ICD-10-CM | POA: Diagnosis not present

## 2022-09-27 DIAGNOSIS — K3184 Gastroparesis: Secondary | ICD-10-CM | POA: Diagnosis not present

## 2022-09-27 DIAGNOSIS — N184 Chronic kidney disease, stage 4 (severe): Secondary | ICD-10-CM

## 2022-09-27 HISTORY — DX: Acute myocardial infarction, unspecified: I21.9

## 2022-09-27 HISTORY — DX: Chronic kidney disease, unspecified: N18.9

## 2022-09-30 ENCOUNTER — Encounter: Payer: Self-pay | Admitting: Cardiovascular Disease

## 2022-09-30 NOTE — Progress Notes (Signed)
PERIOPERATIVE PRESCRIPTION FOR IMPLANTED CARDIAC DEVICE PROGRAMMING  Patient Information: Name:  Tyashia Morrisette  DOB:  Apr 27, 1971  MRN:  [id number]   Planned Procedure: Left Arm AV graft Surgeon: Dr. Donnetta Hutching Date of Procedure: 10-01-2022  Cautery will be used.  Device Information:  Clinic EP Physician:  Dr. Doralee Albino  Device Type:  Defibrillator Manufacturer and Phone #:  Medtronic: [id number] Pacemaker Dependent?:  No. Date of Last Device Check:  09/13/2022 Normal Device Function?:  Yes.    Electrophysiologist's Recommendations:  Have magnet available. Provide continuous ECG monitoring when magnet is used or reprogramming is to be performed.  Procedure may interfere with device function.  Magnet should be placed over device during procedure.  Per Device Clinic Standing Orders, Damian Leavell, RN  3:38 PM 09/30/2022

## 2022-10-01 ENCOUNTER — Encounter (HOSPITAL_COMMUNITY): Payer: Self-pay | Admitting: Vascular Surgery

## 2022-10-01 ENCOUNTER — Telehealth: Payer: Self-pay | Admitting: Student in an Organized Health Care Education/Training Program

## 2022-10-01 ENCOUNTER — Ambulatory Visit (HOSPITAL_COMMUNITY)
Admission: RE | Admit: 2022-10-01 | Discharge: 2022-10-01 | Disposition: A | Discharge status: Home / Self Care | Payer: BC Managed Care – PPO | Source: Ambulatory Visit | Attending: Vascular Surgery | Admitting: Vascular Surgery

## 2022-10-01 ENCOUNTER — Ambulatory Visit (HOSPITAL_COMMUNITY): Payer: BC Managed Care – PPO | Admitting: Anesthesiology

## 2022-10-01 ENCOUNTER — Other Ambulatory Visit: Payer: Self-pay

## 2022-10-01 ENCOUNTER — Encounter (HOSPITAL_COMMUNITY)
Admission: RE | Disposition: A | Discharge status: Home / Self Care | Payer: Self-pay | Source: Ambulatory Visit | Attending: Vascular Surgery

## 2022-10-01 DIAGNOSIS — M1990 Unspecified osteoarthritis, unspecified site: Secondary | ICD-10-CM | POA: Insufficient documentation

## 2022-10-01 DIAGNOSIS — N184 Chronic kidney disease, stage 4 (severe): Secondary | ICD-10-CM

## 2022-10-01 DIAGNOSIS — Z992 Dependence on renal dialysis: Secondary | ICD-10-CM | POA: Diagnosis not present

## 2022-10-01 DIAGNOSIS — I5033 Acute on chronic diastolic (congestive) heart failure: Secondary | ICD-10-CM | POA: Diagnosis not present

## 2022-10-01 DIAGNOSIS — E1022 Type 1 diabetes mellitus with diabetic chronic kidney disease: Secondary | ICD-10-CM | POA: Diagnosis present

## 2022-10-01 DIAGNOSIS — R059 Cough, unspecified: Secondary | ICD-10-CM | POA: Diagnosis not present

## 2022-10-01 DIAGNOSIS — N189 Chronic kidney disease, unspecified: Secondary | ICD-10-CM | POA: Diagnosis not present

## 2022-10-01 DIAGNOSIS — I130 Hypertensive heart and chronic kidney disease with heart failure and stage 1 through stage 4 chronic kidney disease, or unspecified chronic kidney disease: Secondary | ICD-10-CM | POA: Diagnosis not present

## 2022-10-01 DIAGNOSIS — Z794 Long term (current) use of insulin: Secondary | ICD-10-CM | POA: Diagnosis not present

## 2022-10-01 DIAGNOSIS — F32A Depression, unspecified: Secondary | ICD-10-CM | POA: Diagnosis not present

## 2022-10-01 DIAGNOSIS — J45909 Unspecified asthma, uncomplicated: Secondary | ICD-10-CM | POA: Diagnosis not present

## 2022-10-01 DIAGNOSIS — N179 Acute kidney failure, unspecified: Secondary | ICD-10-CM | POA: Diagnosis not present

## 2022-10-01 DIAGNOSIS — N3289 Other specified disorders of bladder: Secondary | ICD-10-CM | POA: Diagnosis not present

## 2022-10-01 DIAGNOSIS — J121 Respiratory syncytial virus pneumonia: Secondary | ICD-10-CM | POA: Diagnosis not present

## 2022-10-01 DIAGNOSIS — Y848 Other medical procedures as the cause of abnormal reaction of the patient, or of later complication, without mention of misadventure at the time of the procedure: Secondary | ICD-10-CM | POA: Diagnosis not present

## 2022-10-01 DIAGNOSIS — E1143 Type 2 diabetes mellitus with diabetic autonomic (poly)neuropathy: Secondary | ICD-10-CM | POA: Insufficient documentation

## 2022-10-01 DIAGNOSIS — N186 End stage renal disease: Secondary | ICD-10-CM | POA: Insufficient documentation

## 2022-10-01 DIAGNOSIS — I5020 Unspecified systolic (congestive) heart failure: Secondary | ICD-10-CM | POA: Diagnosis not present

## 2022-10-01 DIAGNOSIS — I509 Heart failure, unspecified: Secondary | ICD-10-CM | POA: Insufficient documentation

## 2022-10-01 DIAGNOSIS — J90 Pleural effusion, not elsewhere classified: Secondary | ICD-10-CM | POA: Diagnosis not present

## 2022-10-01 DIAGNOSIS — J9601 Acute respiratory failure with hypoxia: Secondary | ICD-10-CM | POA: Diagnosis not present

## 2022-10-01 DIAGNOSIS — F419 Anxiety disorder, unspecified: Secondary | ICD-10-CM | POA: Insufficient documentation

## 2022-10-01 DIAGNOSIS — J9811 Atelectasis: Secondary | ICD-10-CM | POA: Diagnosis present

## 2022-10-01 DIAGNOSIS — R112 Nausea with vomiting, unspecified: Secondary | ICD-10-CM | POA: Diagnosis not present

## 2022-10-01 DIAGNOSIS — I5042 Chronic combined systolic (congestive) and diastolic (congestive) heart failure: Secondary | ICD-10-CM | POA: Diagnosis not present

## 2022-10-01 DIAGNOSIS — E10628 Type 1 diabetes mellitus with other skin complications: Secondary | ICD-10-CM | POA: Diagnosis not present

## 2022-10-01 DIAGNOSIS — K3184 Gastroparesis: Secondary | ICD-10-CM | POA: Diagnosis not present

## 2022-10-01 DIAGNOSIS — I132 Hypertensive heart and chronic kidney disease with heart failure and with stage 5 chronic kidney disease, or end stage renal disease: Secondary | ICD-10-CM | POA: Insufficient documentation

## 2022-10-01 DIAGNOSIS — I161 Hypertensive emergency: Secondary | ICD-10-CM | POA: Diagnosis not present

## 2022-10-01 DIAGNOSIS — I771 Stricture of artery: Secondary | ICD-10-CM | POA: Diagnosis not present

## 2022-10-01 DIAGNOSIS — E1065 Type 1 diabetes mellitus with hyperglycemia: Secondary | ICD-10-CM | POA: Diagnosis present

## 2022-10-01 DIAGNOSIS — T8089XA Other complications following infusion, transfusion and therapeutic injection, initial encounter: Secondary | ICD-10-CM | POA: Diagnosis not present

## 2022-10-01 DIAGNOSIS — E1043 Type 1 diabetes mellitus with diabetic autonomic (poly)neuropathy: Secondary | ICD-10-CM | POA: Diagnosis not present

## 2022-10-01 DIAGNOSIS — K219 Gastro-esophageal reflux disease without esophagitis: Secondary | ICD-10-CM | POA: Insufficient documentation

## 2022-10-01 DIAGNOSIS — E1122 Type 2 diabetes mellitus with diabetic chronic kidney disease: Secondary | ICD-10-CM | POA: Insufficient documentation

## 2022-10-01 DIAGNOSIS — N185 Chronic kidney disease, stage 5: Secondary | ICD-10-CM | POA: Diagnosis not present

## 2022-10-01 DIAGNOSIS — R0602 Shortness of breath: Secondary | ICD-10-CM | POA: Diagnosis not present

## 2022-10-01 DIAGNOSIS — D62 Acute posthemorrhagic anemia: Secondary | ICD-10-CM | POA: Diagnosis not present

## 2022-10-01 DIAGNOSIS — D631 Anemia in chronic kidney disease: Secondary | ICD-10-CM | POA: Diagnosis not present

## 2022-10-01 DIAGNOSIS — Z7984 Long term (current) use of oral hypoglycemic drugs: Secondary | ICD-10-CM | POA: Insufficient documentation

## 2022-10-01 DIAGNOSIS — E872 Acidosis, unspecified: Secondary | ICD-10-CM | POA: Diagnosis not present

## 2022-10-01 DIAGNOSIS — R9431 Abnormal electrocardiogram [ECG] [EKG]: Secondary | ICD-10-CM | POA: Diagnosis not present

## 2022-10-01 DIAGNOSIS — I471 Supraventricular tachycardia, unspecified: Secondary | ICD-10-CM | POA: Diagnosis present

## 2022-10-01 DIAGNOSIS — N250 Renal osteodystrophy: Secondary | ICD-10-CM | POA: Diagnosis not present

## 2022-10-01 DIAGNOSIS — Y92230 Patient room in hospital as the place of occurrence of the external cause: Secondary | ICD-10-CM | POA: Diagnosis not present

## 2022-10-01 DIAGNOSIS — K920 Hematemesis: Secondary | ICD-10-CM | POA: Diagnosis not present

## 2022-10-01 DIAGNOSIS — K922 Gastrointestinal hemorrhage, unspecified: Secondary | ICD-10-CM | POA: Diagnosis present

## 2022-10-01 DIAGNOSIS — R079 Chest pain, unspecified: Secondary | ICD-10-CM | POA: Diagnosis not present

## 2022-10-01 DIAGNOSIS — I4729 Other ventricular tachycardia: Secondary | ICD-10-CM | POA: Diagnosis not present

## 2022-10-01 DIAGNOSIS — I120 Hypertensive chronic kidney disease with stage 5 chronic kidney disease or end stage renal disease: Secondary | ICD-10-CM | POA: Diagnosis not present

## 2022-10-01 DIAGNOSIS — Z1152 Encounter for screening for COVID-19: Secondary | ICD-10-CM | POA: Diagnosis not present

## 2022-10-01 DIAGNOSIS — Z4901 Encounter for fitting and adjustment of extracorporeal dialysis catheter: Secondary | ICD-10-CM | POA: Diagnosis not present

## 2022-10-01 HISTORY — PX: AV FISTULA PLACEMENT: SHX1204

## 2022-10-01 LAB — BASIC METABOLIC PANEL
Anion gap: 12 (ref 5–15)
BUN: 51 mg/dL — ABNORMAL HIGH (ref 6–20)
CO2: 21 mmol/L — ABNORMAL LOW (ref 22–32)
Calcium: 8.7 mg/dL — ABNORMAL LOW (ref 8.9–10.3)
Chloride: 104 mmol/L (ref 98–111)
Creatinine, Ser: 4.11 mg/dL — ABNORMAL HIGH (ref 0.44–1.00)
GFR, Estimated: 12 mL/min — ABNORMAL LOW (ref >60)
Glucose, Bld: 245 mg/dL — ABNORMAL HIGH (ref 70–99)
Potassium: 4 mmol/L (ref 3.5–5.1)
Sodium: 137 mmol/L (ref 135–145)

## 2022-10-01 LAB — HEMOGLOBIN AND HEMATOCRIT, BLOOD
HCT: 29.1 % — ABNORMAL LOW (ref 36.0–46.0)
Hemoglobin: 9.3 g/dL — ABNORMAL LOW (ref 12.0–15.0)

## 2022-10-01 LAB — GLUCOSE, CAPILLARY
Glucose-Capillary: 209 mg/dL — ABNORMAL HIGH (ref 70–99)
Glucose-Capillary: 160 mg/dL — ABNORMAL HIGH (ref 70–99)

## 2022-10-01 LAB — POCT PREGNANCY, URINE: Preg Test, Ur: NEGATIVE

## 2022-10-01 SURGERY — ARTERIOVENOUS (AV) FISTULA CREATION
Anesthesia: General | Site: Arm Lower | Laterality: Left

## 2022-10-01 MED ORDER — SUCCINYLCHOLINE CHLORIDE 200 MG/10ML IV SOSY
PREFILLED_SYRINGE | INTRAVENOUS | Status: DC | PRN
  Administered 2022-10-01: 100 mg via INTRAVENOUS

## 2022-10-01 MED ORDER — PHENYLEPHRINE HCL-NACL 20-0.9 MG/250ML-% IV SOLN
INTRAVENOUS | Status: DC | PRN
  Administered 2022-10-01: 10 ug/min via INTRAVENOUS

## 2022-10-01 MED ORDER — CHLORHEXIDINE GLUCONATE 4 % EX LIQD: 60.0000 mL | Freq: Once | CUTANEOUS | Status: DC

## 2022-10-01 MED ORDER — HEPARIN 6000 UNIT IRRIGATION SOLUTION
Status: DC | PRN
  Administered 2022-10-01: 1

## 2022-10-01 MED ORDER — MIDAZOLAM HCL 2 MG/2ML IJ SOLN
INTRAMUSCULAR | Status: DC | PRN
  Administered 2022-10-01: 1 mg via INTRAVENOUS

## 2022-10-01 MED ORDER — FENTANYL CITRATE (PF) 100 MCG/2ML IJ SOLN
INTRAMUSCULAR | Status: AC
  Filled 2022-10-01: qty 2

## 2022-10-01 MED ORDER — VANCOMYCIN HCL IN DEXTROSE 1-5 GM/200ML-% IV SOLN
1000.0000 mg | INTRAVENOUS | Status: AC
  Administered 2022-10-01: 1500 mg via INTRAVENOUS

## 2022-10-01 MED ORDER — VANCOMYCIN HCL IN DEXTROSE 1-5 GM/200ML-% IV SOLN
INTRAVENOUS | Status: AC
  Filled 2022-10-01: qty 200

## 2022-10-01 MED ORDER — PHENYLEPHRINE HCL-NACL 20-0.9 MG/250ML-% IV SOLN
INTRAVENOUS | Status: AC
  Filled 2022-10-01: qty 250

## 2022-10-01 MED ORDER — OXYCODONE-ACETAMINOPHEN 5-325 MG PO TABS: 1.0000 | ORAL_TABLET | Freq: Four times a day (QID) | ORAL | 0 refills | Status: DC | PRN

## 2022-10-01 MED ORDER — ROCURONIUM BROMIDE 10 MG/ML (PF) SYRINGE
PREFILLED_SYRINGE | INTRAVENOUS | Status: DC | PRN
  Administered 2022-10-01: 10 mg via INTRAVENOUS
  Administered 2022-10-01: 20 mg via INTRAVENOUS

## 2022-10-01 MED ORDER — LIDOCAINE-EPINEPHRINE 0.5 %-1:200000 IJ SOLN
INTRAMUSCULAR | Status: AC
  Filled 2022-10-01: qty 50

## 2022-10-01 MED ORDER — METOCLOPRAMIDE HCL 5 MG/ML IJ SOLN
INTRAMUSCULAR | Status: AC
  Filled 2022-10-01: qty 2

## 2022-10-01 MED ORDER — PHENYLEPHRINE HCL (PRESSORS) 10 MG/ML IV SOLN
INTRAVENOUS | Status: DC | PRN
  Administered 2022-10-01: 80 ug via INTRAVENOUS

## 2022-10-01 MED ORDER — SUGAMMADEX SODIUM 200 MG/2ML IV SOLN
INTRAVENOUS | Status: DC | PRN
  Administered 2022-10-01: 200 mg via INTRAVENOUS

## 2022-10-01 MED ORDER — 0.9 % SODIUM CHLORIDE (POUR BTL) OPTIME
TOPICAL | Status: DC | PRN
  Administered 2022-10-01: 1000 mL

## 2022-10-01 MED ORDER — FENTANYL CITRATE (PF) 100 MCG/2ML IJ SOLN
INTRAMUSCULAR | Status: DC | PRN
  Administered 2022-10-01: 25 ug via INTRAVENOUS

## 2022-10-01 MED ORDER — LIDOCAINE-EPINEPHRINE 0.5 %-1:200000 IJ SOLN
INTRAMUSCULAR | Status: DC | PRN
  Administered 2022-10-01: 9 mL

## 2022-10-01 MED ORDER — PROPOFOL 500 MG/50ML IV EMUL
INTRAVENOUS | Status: AC
  Filled 2022-10-01: qty 50

## 2022-10-01 MED ORDER — PROPOFOL 10 MG/ML IV BOLUS
INTRAVENOUS | Status: DC | PRN
  Administered 2022-10-01: 140 mg via INTRAVENOUS

## 2022-10-01 MED ORDER — CHLORHEXIDINE GLUCONATE 0.12 % MT SOLN
OROMUCOSAL | Status: AC
  Administered 2022-10-01: 15 mL
  Filled 2022-10-01: qty 15

## 2022-10-01 MED ORDER — SODIUM CHLORIDE 0.9 % IV SOLN: INTRAVENOUS | Status: DC

## 2022-10-01 MED ORDER — MIDAZOLAM HCL 2 MG/2ML IJ SOLN
INTRAMUSCULAR | Status: AC
  Filled 2022-10-01: qty 2

## 2022-10-01 MED ORDER — METOCLOPRAMIDE HCL 5 MG/ML IJ SOLN
10.0000 mg | Freq: Once | INTRAMUSCULAR | Status: AC
  Administered 2022-10-01: 10 mg via INTRAVENOUS

## 2022-10-01 MED ORDER — LIDOCAINE HCL (CARDIAC) PF 100 MG/5ML IV SOSY
PREFILLED_SYRINGE | INTRAVENOUS | Status: DC | PRN
  Administered 2022-10-01: 50 mg via INTRATRACHEAL

## 2022-10-01 SURGICAL SUPPLY — 39 items
ADH SKN CLS APL DERMABOND .7 (GAUZE/BANDAGES/DRESSINGS) ×2
ARMBAND PINK RESTRICT EXTREMIT (MISCELLANEOUS) ×3 IMPLANT
BAG HAMPER (MISCELLANEOUS) ×3 IMPLANT
CANNULA VESSEL 3MM 2 BLNT TIP (CANNULA) ×3 IMPLANT
CLIP LIGATING EXTRA MED SLVR (CLIP) ×3 IMPLANT
CLIP LIGATING EXTRA SM BLUE (MISCELLANEOUS) ×3 IMPLANT
COVER MAYO STAND XLG (MISCELLANEOUS) ×3 IMPLANT
DERMABOND ADVANCED .7 DNX12 (GAUZE/BANDAGES/DRESSINGS) ×3 IMPLANT
ELECT REM PT RETURN 9FT ADLT (ELECTROSURGICAL) ×2
ELECTRODE REM PT RTRN 9FT ADLT (ELECTROSURGICAL) ×3 IMPLANT
GAUZE SPONGE 4X4 12PLY STRL (GAUZE/BANDAGES/DRESSINGS) ×5 IMPLANT
GLOVE BIOGEL PI IND STRL 7.0 (GLOVE) ×6 IMPLANT
GLOVE SURG MICRO LTX SZ7.5 (GLOVE) ×3 IMPLANT
GOWN STRL REUS W/TWL LRG LVL3 (GOWN DISPOSABLE) ×9 IMPLANT
IV NS 500ML (IV SOLUTION) ×2
IV NS 500ML BAXH (IV SOLUTION) ×5 IMPLANT
KIT BLADEGUARD II DBL (SET/KITS/TRAYS/PACK) ×3 IMPLANT
KIT TURNOVER KIT A (KITS) ×3 IMPLANT
LOOP VASCLR MAXI BLUE 18IN ST (MISCELLANEOUS) IMPLANT
LOOP VASCULAR MAXI 18 BLUE (MISCELLANEOUS) ×2
LOOPS VASCLR MAXI BLUE 18IN ST (MISCELLANEOUS) ×2 IMPLANT
MANIFOLD NEPTUNE II (INSTRUMENTS) ×3 IMPLANT
MARKER SKIN DUAL TIP RULER LAB (MISCELLANEOUS) ×6 IMPLANT
NDL HYPO 18GX1.5 BLUNT FILL (NEEDLE) ×2 IMPLANT
NEEDLE HYPO 18GX1.5 BLUNT FILL (NEEDLE) ×2 IMPLANT
NS IRRIG 1000ML POUR BTL (IV SOLUTION) ×3 IMPLANT
PACK CV ACCESS (CUSTOM PROCEDURE TRAY) ×3 IMPLANT
PAD ARMBOARD 7.5X6 YLW CONV (MISCELLANEOUS) ×3 IMPLANT
SET BASIN LINEN APH (SET/KITS/TRAYS/PACK) ×3 IMPLANT
SOL PREP POV-IOD 4OZ 10% (MISCELLANEOUS) ×3 IMPLANT
SOL PREP PROV IODINE SCRUB 4OZ (MISCELLANEOUS) ×3 IMPLANT
SUT PROLENE 6 0 CC (SUTURE) ×4 IMPLANT
SUT VIC AB 3-0 SH 27 (SUTURE) ×2
SUT VIC AB 3-0 SH 27X BRD (SUTURE) ×3 IMPLANT
SYR 10ML LL (SYRINGE) ×3 IMPLANT
SYR BULB IRRIG 60ML STRL (SYRINGE) ×1 IMPLANT
SYR CONTROL 10ML LL (SYRINGE) ×3 IMPLANT
UNDERPAD 30X36 HEAVY ABSORB (UNDERPADS AND DIAPERS) ×3 IMPLANT
VASCULAR TIE MAXI BLUE 18IN ST (MISCELLANEOUS) ×2

## 2022-10-01 NOTE — Anesthesia Preprocedure Evaluation (Addendum)
Anesthesia Evaluation  Patient identified by MRN, date of birth, ID band Patient awake    Reviewed: Allergy & Precautions, H&P , NPO status , Patient's Chart, lab work & pertinent test results, reviewed documented beta blocker date and time   Airway Mallampati: II  TM Distance: >3 FB Neck ROM: Full    Dental  (+) Dental Advisory Given, Missing   Pulmonary asthma    Pulmonary exam normal breath sounds clear to auscultation       Cardiovascular Exercise Tolerance: Good hypertension, Pt. on home beta blockers and Pt. on medications + CAD, + Past MI and +CHF  Normal cardiovascular exam+ Cardiac Defibrillator  Rhythm:Regular Rate:Normal  1. Left ventricular ejection fraction, by estimation, is 50 to 55%. The  left ventricle has low normal function. The left ventricle has no regional  wall motion abnormalities. There is mild left ventricular hypertrophy.  Left ventricular diastolic  parameters are consistent with Grade II diastolic dysfunction  (pseudonormalization).   2. Right ventricular systolic function is normal. The right ventricular  size is normal. There is mildly elevated pulmonary artery systolic  pressure.   3. Left atrial size was severely dilated.   4. The mitral valve is normal in structure. No evidence of mitral valve  regurgitation. No evidence of mitral stenosis.   5. The aortic valve is normal in structure. Aortic valve regurgitation is  not visualized. No aortic stenosis is present.   6. The inferior vena cava is dilated in size with >50% respiratory  variability, suggesting right atrial pressure of 8 mmHg.      Neuro/Psych  Headaches PSYCHIATRIC DISORDERS Anxiety Depression       GI/Hepatic Neg liver ROS,GERD (gastroparesis)  Poorly Controlled and Medicated,,  Endo/Other  diabetes, Well Controlled, Type 2, Oral Hypoglycemic Agents    Renal/GU ESRFRenal disease  negative genitourinary    Musculoskeletal  (+) Arthritis , Osteoarthritis,    Abdominal   Peds negative pediatric ROS (+)  Hematology  (+) Blood dyscrasia, anemia   Anesthesia Other Findings   Reproductive/Obstetrics negative OB ROS                             Anesthesia Physical Anesthesia Plan  ASA: 3  Anesthesia Plan: General   Post-op Pain Management: Minimal or no pain anticipated   Induction: Intravenous, Rapid sequence and Cricoid pressure planned  PONV Risk Score and Plan: 4 or greater and Ondansetron and Metaclopromide  Airway Management Planned: Oral ETT  Additional Equipment:   Intra-op Plan:   Post-operative Plan: Extubation in OR  Informed Consent: I have reviewed the patients History and Physical, chart, labs and discussed the procedure including the risks, benefits and alternatives for the proposed anesthesia with the patient or authorized representative who has indicated his/her understanding and acceptance.     Dental advisory given  Plan Discussed with: CRNA and Surgeon  Anesthesia Plan Comments:         Anesthesia Quick Evaluation

## 2022-10-01 NOTE — Transfer of Care (Signed)
Immediate Anesthesia Transfer of Care Note  Patient: [name]  Procedure(s) Performed: ARTERIOVENOUS (AV)FISTULA CREATION (Left: Arm Lower)  Patient Location: PACU  Anesthesia Type:General  Level of Consciousness: awake and patient cooperative  Airway & Oxygen Therapy: Patient Spontanous Breathing and Patient connected to nasal cannula oxygen  Post-op Assessment: Report given to RN and Post -op Vital signs reviewed and stable  Post vital signs: Reviewed and stable  Last Vitals:  Vitals Value Taken Time  BP 150/117 10/01/22 1315  Temp 36.4 C 10/01/22 1315  Pulse 89 10/01/22 1315  Resp 18 10/01/22 1316  SpO2 100 % 10/01/22 1315  Vitals shown include unvalidated device data.  Last Pain:  Vitals:   10/01/22 1001  TempSrc: Oral  PainSc: 0-No pain         Complications: No notable events documented.

## 2022-10-01 NOTE — Anesthesia Procedure Notes (Signed)
Procedure Name: Intubation Date/Time: 10/01/2022 11:31 AM  Performed by: Ollen Bowl, CRNAPre-anesthesia Checklist: Patient identified, Patient being monitored, Timeout performed, Emergency Drugs available and Suction available Patient Re-evaluated:Patient Re-evaluated prior to induction Oxygen Delivery Method: Circle system utilized Preoxygenation: Pre-oxygenation with 100% oxygen Induction Type: IV induction Ventilation: Mask ventilation without difficulty Laryngoscope Size: Mac and 3 Grade View: Grade I Tube type: Oral Tube size: 7.0 mm Number of attempts: 1 Airway Equipment and Method: Stylet Placement Confirmation: ETT inserted through vocal cords under direct vision, positive ETCO2 and breath sounds checked- equal and bilateral Secured at: 22 cm Tube secured with: Tape Dental Injury: Teeth and Oropharynx as per pre-operative assessment

## 2022-10-01 NOTE — Anesthesia Postprocedure Evaluation (Signed)
Anesthesia Post Note  Patient: [name]  Procedure(s) Performed: ARTERIOVENOUS (AV)FISTULA CREATION (Left: Arm Lower)  Patient location during evaluation: Phase II Anesthesia Type: General Level of consciousness: awake and alert and oriented Pain management: pain level controlled Vital Signs Assessment: post-procedure vital signs reviewed and stable Respiratory status: spontaneous breathing, nonlabored ventilation and respiratory function stable Cardiovascular status: blood pressure returned to baseline and stable Postop Assessment: no apparent nausea or vomiting Anesthetic complications: no  No notable events documented.   Last Vitals:  Vitals:   10/01/22 1400 10/01/22 1413  BP: (!) 160/94 (!) 183/114  Pulse: 89 91  Resp: 19 18  Temp: 36.8 C 36.7 C  SpO2: 95% 99%    Last Pain:  Vitals:   10/01/22 1413  TempSrc: Oral  PainSc: 0-No pain                 Brieann Osinski C Orlander [place]

## 2022-10-01 NOTE — Discharge Instructions (Signed)
[facility]  Discharge Instructions  AV Fistula or Graft Surgery for Dialysis Access  Please refer to the following instructions for your post-procedure care. Your surgeon or physician assistant will discuss any changes with you.  Activity  You may drive the day following your surgery, if you are comfortable and no longer taking prescription pain medication. Resume full activity as the soreness in your incision resolves.  Bathing/Showering  You may shower after you go home. Keep your incision dry for 48 hours. Do not soak in a bathtub, hot tub, or swim until the incision heals completely. You may not shower if you have a hemodialysis catheter.  Incision Care  Clean your incision with mild soap and water after 48 hours. Pat the area dry with a clean towel. You do not need a bandage unless otherwise instructed. Do not apply any ointments or creams to your incision. You may have skin glue on your incision. Do not peel it off. It will come off on its own in about one week. Your arm may swell a bit after surgery. To reduce swelling use pillows to elevate your arm so it is above your heart. Your doctor will tell you if you need to lightly wrap your arm with an ACE bandage.  Diet  Resume your normal diet. There are not special food restrictions following this procedure. In order to heal from your surgery, it is CRITICAL to get adequate nutrition. Your body requires vitamins, minerals, and protein. Vegetables are the best source of vitamins and minerals. Vegetables also provide the perfect balance of protein. Processed food has little nutritional value, so try to avoid this.  Medications  Resume taking all of your medications. If your incision is causing pain, you may take over-the counter pain relievers such as acetaminophen (Tylenol). If you were prescribed a stronger pain medication, please be aware these medications can cause nausea and constipation. Prevent  nausea by taking the medication with a snack or meal. Avoid constipation by drinking plenty of fluids and eating foods with high amount of fiber, such as fruits, vegetables, and grains.  Do not take Tylenol if you are taking prescription pain medications.  Follow up Your surgeon may want to see you in the office following your access surgery. If so, this will be arranged at the time of your surgery.  Please call us immediately for any of the following conditions:  Increased pain, redness, drainage (pus) from your incision site Fever of 101 degrees or higher Severe or worsening pain at your incision site Hand pain or numbness.  Reduce your risk of vascular disease:  Stop smoking. If you would like help, call [facility] at 1-800-QUIT-NOW (762)112-8888) or [place] at [place] your cholesterol Maintain a desired weight Control your diabetes Keep your blood pressure down  Dialysis  It will take several weeks to several months for your new dialysis access to be ready for use. Your surgeon will determine when it is okay to use it. Your nephrologist will continue to direct your dialysis. You can continue to use your Permcath until your new access is ready for use.   10/01/2022 [name] [id number] Jan 04, 1971  Surgeon(s): Daneisha Surges, Arvilla Meres, MD  Procedure(s): INSERTION OF LEFT ARM ARTERIOVENOUS (AV) GORE-TEX GRAFT   May stick graft immediately   May stick graft on designated area only:    Do not stick fistula for 12 weeks    If you have any questions, please call the office at  [phone].  

## 2022-10-01 NOTE — Op Note (Signed)
    OPERATIVE REPORT  DATE OF SURGERY: 10/01/2022  PATIENT: [name], 52 y.o. female MRN: [id number]  DOB: July 03, 1971  PRE-OPERATIVE DIAGNOSIS: Chronic renal insufficiency  POST-OPERATIVE DIAGNOSIS:  Same  PROCEDURE: Left for stage brachiobasilic fistula creation  SURGEON:  Curt Jews, M.D.  PHYSICIAN ASSISTANT: Fulton Mole, RNFA  The assistant was needed for exposure and to expedite the case  ANESTHESIA: General endotracheal  EBL: per anesthesia record  Total I/O In: 700 [I.V.:700] Out: 10 [Blood:10]  BLOOD ADMINISTERED: none  DRAINS: none  SPECIMEN: none  COUNTS CORRECT:  YES  PATIENT DISPOSITION:  PACU - hemodynamically stable  PROCEDURE DETAILS: The patient was taken the operating placed in supine position where the area of the left arm left axilla were prepped and draped in usual sterile fashion.  Using SonoSite ultrasound, the veins in the arm were visualized.  Patient had extremely small cephalic vein.  Did have a moderate size basilic vein and this looked better than when I saw her preoperatively in the office.  The vein did branch just above the elbow and the larger of the 2 branches ran on the medial aspect of the proximal forearm.  Using local anesthesia to supplement her general anesthesia incision was made over this vein longitudinally on the medial aspect of the arm.  The vein was of adequate caliber for fistula attempt.  Tributary branches were ligated with 304 0 silk ties and divided.  The vein was ligated distally and divided and was gently dilated with heparinized saline and was of good caliber.  Separate incision was made over the brachial pulse at the antecubital crease.  The brachial artery was exposed through this incision and was of moderate size.  A tunnel was created between the artery and the vein and the vein was brought through the subcutaneous tunnel.  The brachial artery was occluded proximally and distally and was opened with an 11 blade  and sent longitudinally with Potts scissors.  A small arteriotomy was made to reduce risk for steal.  The vein was cut to the appropriate length and was spatulated and sewn end-to-side to the artery with a running 6-0 Prolene suture.  Clamps were removed and good thrill was noted in the vein.  The patient did maintain a left radial pulse.  Patient does have an AICD but did not have any evidence of left arm swelling to suggest venous hypertension.  The wounds were irrigated with saline.  Hemostasis was obtained with electrocautery.  The wounds were closed with 3-0 Vicryl in the subcutaneous and subcuticular tissue.  Sterile dressing was applied and the patient was transferred to the recovery room in stable condition   Rosetta Posner, M.D., [facility] 10/01/2022 1:03 PM  Note: Portions of this report may have been transcribed using voice recognition software.  Every effort has been made to ensure accuracy; however, inadvertent computerized transcription errors may still be present.

## 2022-10-01 NOTE — Telephone Encounter (Signed)
The patient called me this evening to tell me that her ICD has been beeping.  The beeping is started this evening.  She denies receiving any ICD shocks and otherwise feels fine.  She recently had her device checked on 09/13/2022 and per the report it was functioning appropriately.  Since the patient is asymptomatic and she has not been shocked by her device, I instructed the patient to call her primary EP physician in the morning to inquire about what may be causing her device to be.  I told her that if she does experience a shock from her ICD that she should present to the emergency department.  She verbalized an understanding.

## 2022-10-01 NOTE — H&P (Signed)
Rosetta Posner, MD Physician Specialty: Vascular Surgery   Progress Notes    Signed   Encounter Date: 08/28/2022   Signed                                            [facility] [place]   Patient name: [name]          MRN: [id number]        DOB: 1971/07/11        Sex: female   REASON FOR CONSULT: Discuss options for hemodialysis   HPI: [name] is a 52 y.o. female, who is here today for discussion of hemodialysis options.  She is here with her son.  She has a long history of severe gastroparesis.  She is quite miserable today.  She is having severe nausea.  She has been to the emergency department on 3 occasions in the last week related to this.  She does have chronic renal insufficiency with a creatinine of 3.5.  He is seen today for discussion of long-term dialysis options.  She is right-handed.  She does not have a pacemaker.       Past Medical History:  Diagnosis Date   Anemia     Anxiety     Arthritis     Asthma     CHF (congestive heart failure) (HCC)     Depression     Diabetic ulcer of left foot ([place]) 05/12/2013   Gastroparesis     Generalized abdominal pain     History of chicken pox     Loss of weight 12/02/2019   Migraines     Mood disorder (HCC)      anxiety   Prurigo nodularis      with diabetic dermopathy   Type 1 diabetes, uncontrolled, with neuropathy      [place]   Ulcers of both lower legs ([place]) 02/20/2014           Family History  Problem Relation Age of Onset   Diabetes Mother          type 2   Hypertension Mother     Thyroid disease Mother     Bipolar disorder Mother     Heart disease Mother     Calcium disorder Mother     Cancer Maternal Grandmother          Breast, stomach   Cancer Paternal Grandmother          stomach, lung (smoker)   Diabetes Paternal Grandmother     Diabetes Paternal Grandfather     Heart failure Sister     Diabetes Sister     Stroke Maternal Aunt     Cancer Maternal  Uncle          prostate   CAD Maternal Aunt          stents   Cancer Maternal Aunt 64        ovarian      SOCIAL HISTORY: Social History         Socioeconomic History   Marital status: Divorced      Spouse name: Not on file   Number of children: 2   Years of education: Not on file   Highest education level: Not on file  Occupational History   Occupation: Freight forwarder  Tobacco Use   Smoking  status: Never   Smokeless tobacco: Never  Vaping Use   Vaping Use: Never used  Substance and Sexual Activity   Alcohol use: Not Currently   Drug use: No   Sexual activity: Yes      Partners: Male      Birth control/protection: None  Other Topics Concern   Not on file  Social History Narrative    Caffeine use: daily    Occupation: cleans houses    Regular exercise: yes, active 5x/wk    Diet: good water, fruits/vegetables daily    Social Determinants of Health        Financial Resource Strain: Low Risk  (07/15/2022)    Overall Financial Resource Strain (CARDIA)     Difficulty of Paying Living Expenses: Not very hard  Food Insecurity: No Food Insecurity (07/22/2022)    Hunger Vital Sign     Worried About Running Out of Food in the Last Year: Never true     Ran Out of Food in the Last Year: Never true  Recent Concern: Food Insecurity - Food Insecurity Present (04/23/2022)    Hunger Vital Sign     Worried About Running Out of Food in the Last Year: Sometimes true     Ran Out of Food in the Last Year: Sometimes true  Transportation Needs: No Transportation Needs (07/22/2022)    PRAPARE - Therapist, art (Medical): No     Lack of Transportation (Non-Medical): No  Physical Activity: Not on file  Stress: Not on file  Social Connections: Not on file  Intimate Partner Violence: Not At Risk (07/22/2022)    Humiliation, Afraid, Rape, and Kick questionnaire     Fear of Current or Ex-Partner: No     Emotionally Abused: No     Physically Abused: No     Sexually  Abused: No           Allergies  Allergen Reactions   Atorvastatin Other (See Comments)      Dizziness    Dulaglutide Nausea And Vomiting and Other (See Comments)      Caused pancreatitis    Zofran [Ondansetron] Nausea And Vomiting and Other (See Comments)      Causes nausea vomiting to worsen / doesn't really work for patient.   Amoxicillin Itching and Rash   Lantus [Insulin Glargine] Itching and Rash   Miconazole Nitrate Nausea And Vomiting and Rash            Current Outpatient Medications  Medication Sig Dispense Refill   acetaminophen (TYLENOL) 500 MG tablet Take 1,000 mg by mouth 3 (three) times daily as needed for moderate pain.       albuterol (PROAIR HFA) 108 (90 Base) MCG/ACT inhaler Inhale 2 puffs into the lungs every 6 (six) hours as needed for wheezing or shortness of breath. 6.7 g 1   Carboxymethylcellul-Glycerin (LUBRICATING EYE DROPS OP) Place 1-2 drops into both eyes daily as needed (dry eyes).       Continuous Blood Gluc Sensor (FREESTYLE LIBRE 2 SENSOR) MISC Inject 1 Device into the skin every 14 (fourteen) days.       Ensure Max Protein (ENSURE MAX PROTEIN) LIQD Take 330 mLs (11 oz total) by mouth daily.       hydrALAZINE (APRESOLINE) 50 MG tablet Take 1 tablet (50 mg total) by mouth 3 (three) times daily. 90 tablet 1   hydrOXYzine (ATARAX) 50 MG tablet Take 50 mg by mouth in the morning, at noon, and at  bedtime.       insulin aspart (NOVOLOG) 100 UNIT/ML injection Inject into the skin See admin instructions. Patient uses this with the [place].  Units vary according to blood sugar.       LORazepam (ATIVAN) 0.5 MG tablet Take 1 tablet (0.5 mg total) by mouth every 8 (eight) hours as needed for anxiety (Refractory nausea and vomiting). 30 tablet 0   metoCLOPramide (REGLAN) 10 MG tablet Take 1 tablet (10 mg total) by mouth 3 (three) times daily before meals. 90 tablet 0   metoprolol succinate (TOPROL XL) 25 MG 24 hr tablet Take 1 tablet (25 mg total) by mouth daily. 90  tablet 3   Multiple Vitamin (MULTIVITAMIN WITH MINERALS) TABS tablet Take 1 tablet by mouth daily.       polyethylene glycol (MIRALAX / GLYCOLAX) 17 g packet Take 17 g by mouth daily as needed for moderate constipation.       prochlorperazine (COMPAZINE) 10 MG tablet Take 1 tablet (10 mg total) by mouth every 6 (six) hours as needed for nausea or vomiting. 30 tablet 1   promethazine (PHENERGAN) 25 MG suppository Unwrap and place 1 suppository (25 mg total) rectally every 6 (six) hours as needed for nausea or vomiting. 30 each 2   rosuvastatin (CRESTOR) 20 MG tablet Take 1 tablet (20 mg total) by mouth daily. 30 tablet 3   scopolamine (TRANSDERM-SCOP) 1 MG/3DAYS Place 1 patch (1.5 mg total) onto the skin every 3 (three) days. 24 patch 4   tobramycin (TOBREX) 0.3 % ophthalmic solution Place 1-2 drops into both eyes See admin instructions. Instill 1-2 drops into both eyes 3 times daily the day before, day of, and the day after every 6 weeks eye injections per patient       torsemide (DEMADEX) 20 MG tablet Take 3 tablets (60 mg total) by mouth daily. 90 tablet 1   pantoprazole (PROTONIX) 40 MG tablet Take 1 tablet (40 mg total) by mouth 2 (two) times daily before a meal. 60 tablet 1    No current facility-administered medications for this visit.      REVIEW OF SYSTEMS:  $RemoveB'[X]'[id number]$  denotes positive finding, $RemoveBeforeDEI'[ ]'[id number]$  denotes negative finding Cardiac   Comments:  Chest pain or chest pressure:      Shortness of breath upon exertion:      Short of breath when lying flat:      Irregular heart rhythm:             Vascular      Pain in calf, thigh, or hip brought on by ambulation:      Pain in feet at night that wakes you up from your sleep:       Blood clot in your veins:      Leg swelling:              Pulmonary      Oxygen at home:      Productive cough:       Wheezing:              Neurologic      Sudden weakness in arms or legs:       Sudden numbness in arms or legs:       Sudden onset of difficulty  speaking or slurred speech:      Temporary loss of vision in one eye:       Problems with dizziness:              Gastrointestinal  Blood in stool:       Vomited blood:              Genitourinary      Burning when urinating:       Blood in urine:             Psychiatric      Major depression:              Hematologic      Bleeding problems:      Problems with blood clotting too easily:             Skin      Rashes or ulcers:             Constitutional      Fever or chills:          PHYSICAL EXAM:    Vitals:    08/28/22 1018  BP: (!) 166/103  Pulse: (!) 103  Temp: 97.9 F (36.6 C)  Weight: 177 lb 6.4 oz (80.5 kg)  Height: 6' (1.829 m)      GENERAL: The patient is a well-nourished female, in no acute distress. The vital signs are documented above. CARDIOVASCULAR: 2+ radial pulses bilaterally.  Extremely small surface veins bilaterally. PULMONARY: There is good air exchange  MUSCULOSKELETAL: There are no major deformities or cyanosis. NEUROLOGIC: No focal weakness or paresthesias are detected. SKIN: There are no ulcers or rashes noted. PSYCHIATRIC: The patient has a normal affect.   DATA:  I imaged her arm veins with SonoSite ultrasound.  She has extremely small cephalic and basilic veins.   MEDICAL ISSUES: I discussed options for hemodialysis with the patient.  I discussed tunneled catheter for acute dialysis.  Also discussed AV graft and AV fistula for long-term dialysis.  She clearly is not a fistula candidate due to the extremely small surface veins.  I did discuss the procedure of left arm AV graft placement which I would recommend is her first access.  Explained that she would have an incision at the antecubital space in the axilla with a upper arm graft.  I discussed limitations with clotting and the maintenance.  I would recommend deferring this until she is very close to the need for dialysis.  Explained that she would need 1 month of healing of the upper  arm graft prior to initiation of dialysis.  Will defer until felt appropriate by nephrology     Rosetta Posner, MD [facility] [phone] Pager [phone]   Note: Portions of this report may have been transcribed using voice recognition software.  Every effort has been made to ensure accuracy; however, inadvertent computerized transcription errors may still be present.          Electronically signed by Rosetta Posner, MD at 08/28/2022 11:19 AM  Addendum:  The patient has been re-examined and re-evaluated.  The patient's history and physical has been reviewed and is unchanged.    [name] is a 52 y.o. female is being admitted with CKD IV. All the risks, benefits and other treatment options have been discussed with the patient. The patient has consented to proceed with Procedure(s): INSERTION OF LEFT ARM ARTERIOVENOUS (AV) GORE-TEX GRAFT as a surgical intervention.  Curt Jews 10/01/2022 10:51 AM Vascular and Vein Surgery

## 2022-10-02 ENCOUNTER — Telehealth: Payer: Self-pay | Admitting: Vascular Surgery

## 2022-10-02 ENCOUNTER — Telehealth: Payer: Self-pay

## 2022-10-02 NOTE — Telephone Encounter (Signed)
This patient called last night about some oozing from her incision.  She had a fistula done yesterday by Dr. Donnetta Hutching.  She also noted a beeping noise which apparently was related to her AICD.  There is a separate message addressing this.  I gave her 3 choices, she could come to the emergency department in [place] at [facility] and I could see your, she could go to the local emergency department, all or she could call if the oozing persisted and see Dr. Donnetta Hutching in the office tomorrow.  Gae Gallop, MD 7:28 AM

## 2022-10-02 NOTE — Telephone Encounter (Signed)
Pt called stating there was a noise coming from her arm that she had never heard before and she was having some bleeding from the surgical site.  Reviewed pt's chart, returned call for clarification, no answer, lf vm.

## 2022-10-04 ENCOUNTER — Inpatient Hospital Stay (HOSPITAL_BASED_OUTPATIENT_CLINIC_OR_DEPARTMENT_OTHER)
Admission: EM | Admit: 2022-10-04 | Discharge: 2022-10-15 | DRG: 981 | Disposition: A | Discharge status: Home / Self Care | Payer: BC Managed Care – PPO | Attending: Family Medicine | Admitting: Family Medicine

## 2022-10-04 ENCOUNTER — Inpatient Hospital Stay (HOSPITAL_COMMUNITY): Payer: BC Managed Care – PPO

## 2022-10-04 ENCOUNTER — Other Ambulatory Visit (HOSPITAL_BASED_OUTPATIENT_CLINIC_OR_DEPARTMENT_OTHER): Payer: Self-pay

## 2022-10-04 ENCOUNTER — Encounter (HOSPITAL_BASED_OUTPATIENT_CLINIC_OR_DEPARTMENT_OTHER): Payer: Self-pay | Admitting: Emergency Medicine

## 2022-10-04 ENCOUNTER — Encounter (HOSPITAL_COMMUNITY): Payer: Self-pay

## 2022-10-04 ENCOUNTER — Emergency Department (HOSPITAL_BASED_OUTPATIENT_CLINIC_OR_DEPARTMENT_OTHER): Payer: BC Managed Care – PPO | Admitting: Radiology

## 2022-10-04 ENCOUNTER — Inpatient Hospital Stay (HOSPITAL_COMMUNITY): Admission: RE | Admit: 2022-10-04 | Payer: BC Managed Care – PPO | Source: Ambulatory Visit

## 2022-10-04 ENCOUNTER — Other Ambulatory Visit: Payer: Self-pay

## 2022-10-04 DIAGNOSIS — J9601 Acute respiratory failure with hypoxia: Secondary | ICD-10-CM | POA: Diagnosis present

## 2022-10-04 DIAGNOSIS — K920 Hematemesis: Secondary | ICD-10-CM | POA: Diagnosis present

## 2022-10-04 DIAGNOSIS — J45909 Unspecified asthma, uncomplicated: Secondary | ICD-10-CM | POA: Diagnosis present

## 2022-10-04 DIAGNOSIS — T8089XA Other complications following infusion, transfusion and therapeutic injection, initial encounter: Secondary | ICD-10-CM | POA: Diagnosis not present

## 2022-10-04 DIAGNOSIS — E10628 Type 1 diabetes mellitus with other skin complications: Secondary | ICD-10-CM | POA: Diagnosis present

## 2022-10-04 DIAGNOSIS — I252 Old myocardial infarction: Secondary | ICD-10-CM

## 2022-10-04 DIAGNOSIS — L281 Prurigo nodularis: Secondary | ICD-10-CM | POA: Diagnosis present

## 2022-10-04 DIAGNOSIS — F32A Depression, unspecified: Secondary | ICD-10-CM | POA: Diagnosis present

## 2022-10-04 DIAGNOSIS — I808 Phlebitis and thrombophlebitis of other sites: Secondary | ICD-10-CM | POA: Diagnosis present

## 2022-10-04 DIAGNOSIS — Z833 Family history of diabetes mellitus: Secondary | ICD-10-CM

## 2022-10-04 DIAGNOSIS — N186 End stage renal disease: Secondary | ICD-10-CM | POA: Diagnosis present

## 2022-10-04 DIAGNOSIS — J90 Pleural effusion, not elsewhere classified: Principal | ICD-10-CM

## 2022-10-04 DIAGNOSIS — K3184 Gastroparesis: Secondary | ICD-10-CM | POA: Diagnosis present

## 2022-10-04 DIAGNOSIS — I771 Stricture of artery: Secondary | ICD-10-CM | POA: Diagnosis not present

## 2022-10-04 DIAGNOSIS — D631 Anemia in chronic kidney disease: Secondary | ICD-10-CM | POA: Diagnosis present

## 2022-10-04 DIAGNOSIS — J121 Respiratory syncytial virus pneumonia: Principal | ICD-10-CM | POA: Diagnosis present

## 2022-10-04 DIAGNOSIS — Z794 Long term (current) use of insulin: Secondary | ICD-10-CM | POA: Diagnosis not present

## 2022-10-04 DIAGNOSIS — D62 Acute posthemorrhagic anemia: Secondary | ICD-10-CM | POA: Diagnosis not present

## 2022-10-04 DIAGNOSIS — K922 Gastrointestinal hemorrhage, unspecified: Secondary | ICD-10-CM | POA: Diagnosis present

## 2022-10-04 DIAGNOSIS — Z992 Dependence on renal dialysis: Secondary | ICD-10-CM

## 2022-10-04 DIAGNOSIS — I4729 Other ventricular tachycardia: Secondary | ICD-10-CM

## 2022-10-04 DIAGNOSIS — N185 Chronic kidney disease, stage 5: Secondary | ICD-10-CM | POA: Diagnosis not present

## 2022-10-04 DIAGNOSIS — Z1152 Encounter for screening for COVID-19: Secondary | ICD-10-CM | POA: Diagnosis not present

## 2022-10-04 DIAGNOSIS — G43909 Migraine, unspecified, not intractable, without status migrainosus: Secondary | ICD-10-CM | POA: Diagnosis present

## 2022-10-04 DIAGNOSIS — Z95810 Presence of automatic (implantable) cardiac defibrillator: Secondary | ICD-10-CM

## 2022-10-04 DIAGNOSIS — E782 Mixed hyperlipidemia: Secondary | ICD-10-CM | POA: Diagnosis present

## 2022-10-04 DIAGNOSIS — I161 Hypertensive emergency: Secondary | ICD-10-CM | POA: Diagnosis present

## 2022-10-04 DIAGNOSIS — Z79899 Other long term (current) drug therapy: Secondary | ICD-10-CM

## 2022-10-04 DIAGNOSIS — I471 Supraventricular tachycardia, unspecified: Secondary | ICD-10-CM | POA: Diagnosis present

## 2022-10-04 DIAGNOSIS — E1043 Type 1 diabetes mellitus with diabetic autonomic (poly)neuropathy: Principal | ICD-10-CM | POA: Diagnosis present

## 2022-10-04 DIAGNOSIS — Y92230 Patient room in hospital as the place of occurrence of the external cause: Secondary | ICD-10-CM | POA: Diagnosis not present

## 2022-10-04 DIAGNOSIS — R6889 Other general symptoms and signs: Secondary | ICD-10-CM | POA: Diagnosis not present

## 2022-10-04 DIAGNOSIS — Z5941 Food insecurity: Secondary | ICD-10-CM

## 2022-10-04 DIAGNOSIS — R079 Chest pain, unspecified: Secondary | ICD-10-CM | POA: Diagnosis not present

## 2022-10-04 DIAGNOSIS — E872 Acidosis, unspecified: Secondary | ICD-10-CM | POA: Diagnosis present

## 2022-10-04 DIAGNOSIS — E1022 Type 1 diabetes mellitus with diabetic chronic kidney disease: Secondary | ICD-10-CM | POA: Diagnosis present

## 2022-10-04 DIAGNOSIS — E1065 Type 1 diabetes mellitus with hyperglycemia: Secondary | ICD-10-CM | POA: Diagnosis present

## 2022-10-04 DIAGNOSIS — J9811 Atelectasis: Secondary | ICD-10-CM | POA: Diagnosis present

## 2022-10-04 DIAGNOSIS — B3731 Acute candidiasis of vulva and vagina: Secondary | ICD-10-CM | POA: Diagnosis not present

## 2022-10-04 DIAGNOSIS — I5042 Chronic combined systolic (congestive) and diastolic (congestive) heart failure: Secondary | ICD-10-CM

## 2022-10-04 DIAGNOSIS — Z880 Allergy status to penicillin: Secondary | ICD-10-CM

## 2022-10-04 DIAGNOSIS — Z8249 Family history of ischemic heart disease and other diseases of the circulatory system: Secondary | ICD-10-CM

## 2022-10-04 DIAGNOSIS — R0602 Shortness of breath: Secondary | ICD-10-CM | POA: Diagnosis not present

## 2022-10-04 DIAGNOSIS — N179 Acute kidney failure, unspecified: Secondary | ICD-10-CM | POA: Diagnosis present

## 2022-10-04 DIAGNOSIS — I132 Hypertensive heart and chronic kidney disease with heart failure and with stage 5 chronic kidney disease, or end stage renal disease: Secondary | ICD-10-CM | POA: Diagnosis present

## 2022-10-04 DIAGNOSIS — Y848 Other medical procedures as the cause of abnormal reaction of the patient, or of later complication, without mention of misadventure at the time of the procedure: Secondary | ICD-10-CM | POA: Diagnosis not present

## 2022-10-04 DIAGNOSIS — R7989 Other specified abnormal findings of blood chemistry: Secondary | ICD-10-CM | POA: Diagnosis present

## 2022-10-04 DIAGNOSIS — Z8674 Personal history of sudden cardiac arrest: Secondary | ICD-10-CM

## 2022-10-04 DIAGNOSIS — F419 Anxiety disorder, unspecified: Secondary | ICD-10-CM | POA: Diagnosis present

## 2022-10-04 DIAGNOSIS — R5084 Febrile nonhemolytic transfusion reaction: Secondary | ICD-10-CM | POA: Diagnosis not present

## 2022-10-04 DIAGNOSIS — M1990 Unspecified osteoarthritis, unspecified site: Secondary | ICD-10-CM | POA: Diagnosis present

## 2022-10-04 DIAGNOSIS — Z888 Allergy status to other drugs, medicaments and biological substances status: Secondary | ICD-10-CM

## 2022-10-04 DIAGNOSIS — I5020 Unspecified systolic (congestive) heart failure: Secondary | ICD-10-CM | POA: Diagnosis not present

## 2022-10-04 DIAGNOSIS — Z635 Disruption of family by separation and divorce: Secondary | ICD-10-CM

## 2022-10-04 DIAGNOSIS — I2510 Atherosclerotic heart disease of native coronary artery without angina pectoris: Secondary | ICD-10-CM | POA: Diagnosis present

## 2022-10-04 LAB — CBC WITH DIFFERENTIAL/PLATELET
Abs Immature Granulocytes: 0.02 10*3/uL (ref 0.00–0.07)
Basophils Absolute: 0 10*3/uL (ref 0.0–0.1)
Basophils Relative: 0 %
Eosinophils Absolute: 0 10*3/uL (ref 0.0–0.5)
Eosinophils Relative: 0 %
HCT: 28.6 % — ABNORMAL LOW (ref 36.0–46.0)
Hemoglobin: 9.4 g/dL — ABNORMAL LOW (ref 12.0–15.0)
Immature Granulocytes: 0 %
Lymphocytes Relative: 7 %
Lymphs Abs: 0.5 10*3/uL — ABNORMAL LOW (ref 0.7–4.0)
MCH: 28.6 pg (ref 26.0–34.0)
MCHC: 32.9 g/dL (ref 30.0–36.0)
MCV: 86.9 fL (ref 80.0–100.0)
Monocytes Absolute: 0.1 10*3/uL (ref 0.1–1.0)
Monocytes Relative: 2 %
Neutro Abs: 6.5 10*3/uL (ref 1.7–7.7)
Neutrophils Relative %: 91 %
Platelets: 288 10*3/uL (ref 150–400)
RBC: 3.29 MIL/uL — ABNORMAL LOW (ref 3.87–5.11)
RDW: 15.4 % (ref 11.5–15.5)
WBC: 7.2 10*3/uL (ref 4.0–10.5)
nRBC: 0 % (ref 0.0–0.2)

## 2022-10-04 LAB — URINALYSIS, ROUTINE W REFLEX MICROSCOPIC
Bacteria, UA: NONE SEEN
Bilirubin Urine: NEGATIVE
Glucose, UA: 500 mg/dL — AB
Ketones, ur: NEGATIVE mg/dL
Leukocytes,Ua: NEGATIVE
Nitrite: NEGATIVE
Protein, ur: >300 mg/dL — AB
Specific Gravity, Urine: 1.012 (ref 1.005–1.030)
pH: 6.5 (ref 5.0–8.0)

## 2022-10-04 LAB — RESP PANEL BY RT-PCR (RSV, FLU A&B, COVID)  RVPGX2
Influenza A by PCR: NEGATIVE
Influenza B by PCR: NEGATIVE
Resp Syncytial Virus by PCR: POSITIVE — AB
SARS Coronavirus 2 by RT PCR: NEGATIVE

## 2022-10-04 LAB — CBG MONITORING, ED
Glucose-Capillary: 302 mg/dL — ABNORMAL HIGH (ref 70–99)
Glucose-Capillary: 238 mg/dL — ABNORMAL HIGH (ref 70–99)
Glucose-Capillary: 222 mg/dL — ABNORMAL HIGH (ref 70–99)

## 2022-10-04 LAB — COMPREHENSIVE METABOLIC PANEL
ALT: 14 U/L (ref 0–44)
AST: 9 U/L — ABNORMAL LOW (ref 15–41)
Albumin: 3.6 g/dL (ref 3.5–5.0)
Alkaline Phosphatase: 83 U/L (ref 38–126)
Anion gap: 10 (ref 5–15)
BUN: 37 mg/dL — ABNORMAL HIGH (ref 6–20)
CO2: 23 mmol/L (ref 22–32)
Calcium: 8.9 mg/dL (ref 8.9–10.3)
Chloride: 107 mmol/L (ref 98–111)
Creatinine, Ser: 3.61 mg/dL — ABNORMAL HIGH (ref 0.44–1.00)
GFR, Estimated: 15 mL/min — ABNORMAL LOW (ref >60)
Glucose, Bld: 288 mg/dL — ABNORMAL HIGH (ref 70–99)
Potassium: 4.1 mmol/L (ref 3.5–5.1)
Sodium: 140 mmol/L (ref 135–145)
Total Bilirubin: 1 mg/dL (ref 0.3–1.2)
Total Protein: 7 g/dL (ref 6.5–8.1)

## 2022-10-04 LAB — TROPONIN I (HIGH SENSITIVITY)
Troponin I (High Sensitivity): 24 ng/L — ABNORMAL HIGH (ref <18)
Troponin I (High Sensitivity): 26 ng/L — ABNORMAL HIGH (ref <18)

## 2022-10-04 LAB — LACTIC ACID, PLASMA: Lactic Acid, Venous: 1.2 mmol/L (ref 0.5–1.9)

## 2022-10-04 LAB — LIPASE, BLOOD: Lipase: <10 U/L — ABNORMAL LOW (ref 11–51)

## 2022-10-04 LAB — GLUCOSE, CAPILLARY: Glucose-Capillary: 313 mg/dL — ABNORMAL HIGH (ref 70–99)

## 2022-10-04 LAB — D-DIMER, QUANTITATIVE: D-Dimer, Quant: 6.7 ug/mL-FEU — ABNORMAL HIGH (ref 0.00–0.50)

## 2022-10-04 LAB — BRAIN NATRIURETIC PEPTIDE: B Natriuretic Peptide: 1496.7 pg/mL — ABNORMAL HIGH (ref 0.0–100.0)

## 2022-10-04 MED ORDER — ISOSORBIDE DINITRATE 10 MG PO TABS
10.0000 mg | ORAL_TABLET | Freq: Three times a day (TID) | ORAL | Status: DC
  Administered 2022-10-05 – 2022-10-07 (×6): 10 mg via ORAL
  Filled 2022-10-04 (×7): qty 1

## 2022-10-04 MED ORDER — GUAIFENESIN 100 MG/5ML PO LIQD
10.0000 mL | Freq: Four times a day (QID) | ORAL | Status: DC
  Administered 2022-10-04 – 2022-10-06 (×4): 10 mL via ORAL
  Filled 2022-10-04 (×4): qty 10

## 2022-10-04 MED ORDER — HYDRALAZINE HCL 50 MG PO TABS: 50.0000 mg | ORAL_TABLET | Freq: Three times a day (TID) | ORAL | Status: DC

## 2022-10-04 MED ORDER — METOPROLOL SUCCINATE ER 50 MG PO TB24
50.0000 mg | ORAL_TABLET | Freq: Every day | ORAL | Status: DC
  Administered 2022-10-04 – 2022-10-08 (×4): 50 mg via ORAL
  Filled 2022-10-04 (×5): qty 1

## 2022-10-04 MED ORDER — SCOPOLAMINE 1 MG/3DAYS TD PT72
1.0000 | MEDICATED_PATCH | TRANSDERMAL | Status: DC
  Administered 2022-10-04 – 2022-10-13 (×4): 1.5 mg via TRANSDERMAL
  Filled 2022-10-04 (×4): qty 1

## 2022-10-04 MED ORDER — FUROSEMIDE 10 MG/ML IJ SOLN: 20.0000 mg | Freq: Two times a day (BID) | INTRAMUSCULAR | Status: DC

## 2022-10-04 MED ORDER — FENTANYL CITRATE PF 50 MCG/ML IJ SOSY
12.5000 ug | PREFILLED_SYRINGE | Freq: Once | INTRAMUSCULAR | Status: AC
  Administered 2022-10-04: 12.5 ug via INTRAVENOUS
  Filled 2022-10-04: qty 1

## 2022-10-04 MED ORDER — PRAVASTATIN SODIUM 40 MG PO TABS
40.0000 mg | ORAL_TABLET | Freq: Every day | ORAL | Status: DC
  Administered 2022-10-05 – 2022-10-14 (×7): 40 mg via ORAL
  Filled 2022-10-04 (×9): qty 1

## 2022-10-04 MED ORDER — INSULIN ASPART 100 UNIT/ML IJ SOLN
0.0000 [IU] | Freq: Every day | INTRAMUSCULAR | Status: DC
  Administered 2022-10-05: 4 [IU] via SUBCUTANEOUS
  Administered 2022-10-11 – 2022-10-13 (×3): 2 [IU] via SUBCUTANEOUS
  Administered 2022-10-14: 3 [IU] via SUBCUTANEOUS

## 2022-10-04 MED ORDER — METOCLOPRAMIDE HCL 5 MG/ML IJ SOLN
10.0000 mg | Freq: Once | INTRAMUSCULAR | Status: AC
  Administered 2022-10-04: 10 mg via INTRAVENOUS
  Filled 2022-10-04: qty 2

## 2022-10-04 MED ORDER — ACETAMINOPHEN 325 MG PO TABS
650.0000 mg | ORAL_TABLET | Freq: Four times a day (QID) | ORAL | Status: DC | PRN
  Administered 2022-10-04 – 2022-10-10 (×10): 650 mg via ORAL
  Filled 2022-10-04 (×10): qty 2

## 2022-10-04 MED ORDER — DICYCLOMINE HCL 10 MG/ML IM SOLN
20.0000 mg | Freq: Once | INTRAMUSCULAR | Status: AC
  Administered 2022-10-04: 20 mg via INTRAMUSCULAR
  Filled 2022-10-04: qty 2

## 2022-10-04 MED ORDER — HEPARIN SODIUM (PORCINE) 5000 UNIT/ML IJ SOLN
5000.0000 [IU] | Freq: Two times a day (BID) | INTRAMUSCULAR | Status: DC
  Administered 2022-10-05 – 2022-10-06 (×5): 5000 [IU] via SUBCUTANEOUS
  Filled 2022-10-04 (×5): qty 1

## 2022-10-04 MED ORDER — SODIUM CHLORIDE 0.9 % IV SOLN: INTRAVENOUS | Status: DC

## 2022-10-04 MED ORDER — INSULIN GLARGINE-YFGN 100 UNIT/ML ~~LOC~~ SOLN
10.0000 [IU] | Freq: Every day | SUBCUTANEOUS | Status: DC
  Administered 2022-10-05 – 2022-10-13 (×10): 10 [IU] via SUBCUTANEOUS
  Filled 2022-10-04 (×11): qty 0.1

## 2022-10-04 MED ORDER — METOCLOPRAMIDE HCL 5 MG/ML IJ SOLN
5.0000 mg | Freq: Four times a day (QID) | INTRAMUSCULAR | Status: AC
  Administered 2022-10-04 – 2022-10-05 (×5): 5 mg via INTRAVENOUS
  Filled 2022-10-04 (×5): qty 2

## 2022-10-04 MED ORDER — PANTOPRAZOLE SODIUM 40 MG IV SOLR
40.0000 mg | Freq: Every day | INTRAVENOUS | Status: DC
  Administered 2022-10-04 – 2022-10-06 (×3): 40 mg via INTRAVENOUS
  Filled 2022-10-04 (×3): qty 10

## 2022-10-04 MED ORDER — FUROSEMIDE 10 MG/ML IJ SOLN
40.0000 mg | Freq: Once | INTRAMUSCULAR | Status: AC
  Administered 2022-10-04: 40 mg via INTRAVENOUS
  Filled 2022-10-04: qty 4

## 2022-10-04 MED ORDER — LEVALBUTEROL HCL 0.63 MG/3ML IN NEBU
0.6300 mg | INHALATION_SOLUTION | Freq: Three times a day (TID) | RESPIRATORY_TRACT | Status: DC
  Administered 2022-10-04 – 2022-10-06 (×5): 0.63 mg via RESPIRATORY_TRACT
  Filled 2022-10-04 (×5): qty 3

## 2022-10-04 MED ORDER — LORAZEPAM 2 MG/ML IJ SOLN
0.5000 mg | Freq: Four times a day (QID) | INTRAMUSCULAR | Status: DC | PRN
  Administered 2022-10-05 – 2022-10-14 (×12): 0.5 mg via INTRAVENOUS
  Filled 2022-10-04 (×12): qty 1

## 2022-10-04 MED ORDER — HYDRALAZINE HCL 50 MG PO TABS
50.0000 mg | ORAL_TABLET | Freq: Three times a day (TID) | ORAL | Status: DC
  Administered 2022-10-04 – 2022-10-07 (×7): 50 mg via ORAL
  Filled 2022-10-04 (×8): qty 1

## 2022-10-04 MED ORDER — PROCHLORPERAZINE EDISYLATE 10 MG/2ML IJ SOLN
10.0000 mg | Freq: Once | INTRAMUSCULAR | Status: AC
  Administered 2022-10-04: 10 mg via INTRAVENOUS
  Filled 2022-10-04: qty 2

## 2022-10-04 MED ORDER — ASPIRIN 81 MG PO TBEC
81.0000 mg | DELAYED_RELEASE_TABLET | Freq: Every day | ORAL | Status: DC
  Administered 2022-10-05 – 2022-10-06 (×2): 81 mg via ORAL
  Filled 2022-10-04 (×2): qty 1

## 2022-10-04 MED ORDER — SODIUM CHLORIDE 0.9 % IV SOLN
100.0000 mg | Freq: Two times a day (BID) | INTRAVENOUS | Status: DC
  Administered 2022-10-04 – 2022-10-07 (×6): 100 mg via INTRAVENOUS
  Filled 2022-10-04 (×6): qty 100

## 2022-10-04 MED ORDER — SODIUM CHLORIDE 0.9 % IV BOLUS
1000.0000 mL | Freq: Once | INTRAVENOUS | Status: AC
  Administered 2022-10-04: 1000 mL via INTRAVENOUS

## 2022-10-04 MED ORDER — PROCHLORPERAZINE EDISYLATE 10 MG/2ML IJ SOLN
5.0000 mg | Freq: Four times a day (QID) | INTRAMUSCULAR | Status: DC | PRN
  Administered 2022-10-05 – 2022-10-13 (×6): 5 mg via INTRAVENOUS
  Filled 2022-10-04 (×7): qty 2

## 2022-10-04 MED ORDER — ACETAMINOPHEN 500 MG PO TABS
1000.0000 mg | ORAL_TABLET | Freq: Once | ORAL | Status: AC
  Administered 2022-10-04: 1000 mg via ORAL
  Filled 2022-10-04: qty 2

## 2022-10-04 MED ORDER — INSULIN ASPART 100 UNIT/ML IJ SOLN
0.0000 [IU] | Freq: Three times a day (TID) | INTRAMUSCULAR | Status: DC
  Administered 2022-10-05: 3 [IU] via SUBCUTANEOUS
  Administered 2022-10-05 (×2): 2 [IU] via SUBCUTANEOUS
  Administered 2022-10-06: 5 [IU] via SUBCUTANEOUS
  Administered 2022-10-06 – 2022-10-07 (×3): 1 [IU] via SUBCUTANEOUS
  Administered 2022-10-08 (×2): 2 [IU] via SUBCUTANEOUS
  Administered 2022-10-10: 5 [IU] via SUBCUTANEOUS
  Administered 2022-10-10 – 2022-10-11 (×3): 3 [IU] via SUBCUTANEOUS
  Administered 2022-10-11 – 2022-10-12 (×2): 2 [IU] via SUBCUTANEOUS
  Administered 2022-10-12 – 2022-10-13 (×2): 1 [IU] via SUBCUTANEOUS
  Administered 2022-10-13 (×2): 2 [IU] via SUBCUTANEOUS
  Administered 2022-10-14 (×2): 1 [IU] via SUBCUTANEOUS
  Administered 2022-10-15: 3 [IU] via SUBCUTANEOUS

## 2022-10-04 NOTE — ED Notes (Signed)
[facility] called for cl transport

## 2022-10-04 NOTE — ED Triage Notes (Signed)
Pt reports abdominal pain and vomiting blood that started this morning. Pt reports that her "defibrillator keeps going off since Tuesday." Pt also reports SHOB.

## 2022-10-04 NOTE — ED Provider Notes (Signed)
[facility] Provider Note   CSN: [id number] Arrival date & time: 10/04/22  0844     History  Chief Complaint  Patient presents with   Abdominal Pain   AICD Problem    [name] is a 52 y.o. female history of NSTEMI, CHF, type 1 diabetes, diabetic gastroparesis, CKD stage IV, status post AV fistula, presented with abdominal pain that began at 0200 this morning.  Patient states she woke up and has been nauseous with hematemesis.  Patient states she has not been able to keep any food or fluids down.  Patient abdominal pain is generalized and has been short of breath constantly since waking up this morning.  Patient also states her defibrillator keeps going off.  Patient denies chest pain, fevers, neck stiffness, vision changes  Home Medications Prior to Admission medications   Medication Sig Start Date End Date Taking? Authorizing Provider  acetaminophen (TYLENOL) 500 MG tablet as needed for moderate pain or mild pain.    [provider]  albuterol (PROAIR HFA) 108 (90 Base) MCG/ACT inhaler Inhale 2 puffs into the lungs every 6 (six) hours as needed for wheezing or shortness of breath. 07/03/20   [name], PA-C  Continuous Blood Gluc Sensor (FREESTYLE LIBRE 2 SENSOR) MISC Inject 1 Device into the skin every 14 (fourteen) days. 03/20/20   [provider]  Ensure Max Protein (ENSURE MAX PROTEIN) LIQD Take 330 mLs (11 oz total) by mouth daily. 04/30/22   Hongalgi, [name], MD  hydrALAZINE (APRESOLINE) 50 MG tablet Take 1 tablet (50 mg total) by mouth 3 (three) times daily. 07/17/22   Domenic Polite, MD  hydrOXYzine (ATARAX) 50 MG tablet Take 50 mg by mouth in the morning, at noon, and at bedtime.    [provider]  insulin aspart (NOVOLOG) 100 UNIT/ML injection Inject 150 Units into the skin See admin instructions. Patient uses this with the [place]. Units vary according to blood sugar. Loads 150 units every 3  days.    [provider]  LORazepam (ATIVAN) 0.5 MG tablet Take 1 tablet (0.5 mg total) by mouth every 8 (eight) hours as needed for anxiety (Refractory nausea and vomiting). 07/17/22   Domenic Polite, MD  metoCLOPramide (REGLAN) 10 MG tablet Take 1 tablet (10 mg total) by mouth 3 (three) times daily before meals. 07/17/22   Domenic Polite, MD  metoprolol succinate (TOPROL-XL) 50 MG 24 hr tablet Take 1 tablet (50 mg total) by mouth daily. Take with or immediately following a meal. 09/05/22   Chandrasekhar, Mahesh A, MD  Multiple Vitamin (MULTIVITAMIN WITH MINERALS) TABS tablet Take 1 tablet by mouth daily. 04/30/22   Hongalgi, [name], MD  nitroGLYCERIN (NITROSTAT) 0.4 MG SL tablet Place 1 tablet (0.4 mg total) under the tongue every 5 (five) minutes as needed for chest pain. 09/05/22 12/04/22  Werner Lean, MD  oxyCODONE-acetaminophen (PERCOCET) 5-325 MG tablet Take 1 tablet by mouth every 6 (six) hours as needed for severe pain. 10/01/22   Rosetta Posner, MD  pantoprazole (PROTONIX) 40 MG tablet Take 1 tablet (40 mg total) by mouth 2 (two) times daily before a meal. 05/24/22 09/08/22  Rai, Ripudeep K, MD  polyethylene glycol (MIRALAX / GLYCOLAX) 17 g packet Take 17 g by mouth daily as needed for moderate constipation.    [provider]  pravastatin (PRAVACHOL) 40 MG tablet Take 1 tablet (40 mg total) by mouth every evening. 09/05/22   Werner Lean, MD  pregabalin (LYRICA) 50 MG capsule Take 50 mg by mouth 3 (three) times daily as needed (neuropathy). 12/05/21   [provider]  prochlorperazine (COMPAZINE) 10 MG tablet Take 1 tablet (10 mg total) by mouth every 6 (six) hours as needed for nausea or vomiting. 05/24/22   Rai, [name], MD  promethazine (PHENERGAN) 12.5 MG tablet Take 12.5 mg by mouth 2 (two) times daily as needed for nausea or vomiting.    [provider]  promethazine (PHENERGAN) 25 MG suppository Unwrap and place 1 suppository (25 mg  total) rectally every 6 (six) hours as needed for nausea or vomiting. 07/17/22   Domenic Polite, MD  scopolamine (TRANSDERM-SCOP) 1 MG/3DAYS Place 1 patch (1.5 mg total) onto the skin every 3 (three) days. 05/26/22   Rai, Ripudeep K, MD  tobramycin (TOBREX) 0.3 % ophthalmic solution Place 1-2 drops into both eyes See admin instructions. Instill 1-2 drops into both eyes 3 times daily the day before, day of, and the day after every 6 weeks eye injections per patient 11/17/20   [provider]  torsemide (DEMADEX) 20 MG tablet TAKE THREE TABLETS BY MOUTH DAILY 08/30/22   Clegg, Amy D, NP      Allergies    Atorvastatin, Dulaglutide, Sertraline, Zofran [ondansetron], Amoxicillin, Lantus [insulin glargine], and Miconazole nitrate    Review of Systems   Review of Systems  Gastrointestinal:  Positive for abdominal pain.  See HPI  Physical Exam Updated Vital Signs BP (!) 186/104   Pulse (!) 109   Temp 98.3 F (36.8 C) (Oral)   Resp (!) 28   LMP 11/17/2021 (Approximate)   SpO2 98%  Physical Exam Vitals and nursing note reviewed.  Constitutional:      General: She is not in acute distress.    Appearance: She is well-developed.     Comments: Actively vomiting, emesis did not appear to be bloody  HENT:     Head: Normocephalic and atraumatic.  Eyes:     Extraocular Movements: Extraocular movements intact.     Conjunctiva/sclera: Conjunctivae normal.     Pupils: Pupils are equal, round, and reactive to light.  Cardiovascular:     Rate and Rhythm: Regular rhythm. Tachycardia present.     Heart sounds: No murmur heard. Pulmonary:     Effort: Pulmonary effort is normal. No respiratory distress.     Breath sounds: Normal breath sounds.  Abdominal:     Palpations: Abdomen is soft.     Tenderness: There is generalized abdominal tenderness. There is no guarding or rebound.  Musculoskeletal:        General: No swelling. Normal range of motion.     Cervical back: Neck supple.  Skin:     General: Skin is warm and dry.     Capillary Refill: Capillary refill takes less than 2 seconds.  Neurological:     General: No focal deficit present.     Mental Status: She is alert and oriented to person, place, and time.  Psychiatric:        Mood and Affect: Mood normal.     ED Results / Procedures / Treatments   Labs (all labs ordered are listed, but only abnormal results are displayed) Labs Reviewed  BRAIN NATRIURETIC PEPTIDE - Abnormal; Notable for the following components:      Result Value   B Natriuretic Peptide 1,496.7 (*)    All other components within normal limits  COMPREHENSIVE METABOLIC PANEL - Abnormal; Notable for the following components:   Glucose,  Bld 288 (*)    BUN 37 (*)    Creatinine, Ser 3.61 (*)    AST 9 (*)    GFR, Estimated 15 (*)    All other components within normal limits  CBC WITH DIFFERENTIAL/PLATELET - Abnormal; Notable for the following components:   RBC 3.29 (*)    Hemoglobin 9.4 (*)    HCT 28.6 (*)    Lymphs Abs 0.5 (*)    All other components within normal limits  URINALYSIS, ROUTINE W REFLEX MICROSCOPIC - Abnormal; Notable for the following components:   Glucose, UA 500 (*)    Hgb urine dipstick SMALL (*)    Protein, ur >300 (*)    All other components within normal limits  D-DIMER, QUANTITATIVE - Abnormal; Notable for the following components:   D-Dimer, Quant 6.70 (*)    All other components within normal limits  LIPASE, BLOOD - Abnormal; Notable for the following components:   Lipase <10 (*)    All other components within normal limits  CBG MONITORING, ED - Abnormal; Notable for the following components:   Glucose-Capillary 302 (*)    All other components within normal limits  TROPONIN I (HIGH SENSITIVITY) - Abnormal; Notable for the following components:   Troponin I (High Sensitivity) 24 (*)    All other components within normal limits  TROPONIN I (HIGH SENSITIVITY) - Abnormal; Notable for the following components:    Troponin I (High Sensitivity) 26 (*)    All other components within normal limits  RESP PANEL BY RT-PCR (RSV, FLU A&B, COVID)  RVPGX2  CULTURE, BLOOD (ROUTINE X 2)  CULTURE, BLOOD (ROUTINE X 2)  LACTIC ACID, PLASMA    EKG EKG Interpretation  Date/Time:  Friday October 04 2022 09:10:55 EST Ventricular Rate:  110 PR Interval:  148 QRS Duration: 92 QT Interval:  347 QTC Calculation: 470 R Axis:   65 Text Interpretation: Sinus tachycardia Multiform ventricular premature complexes Aberrant conduction of SV complex(es) Left atrial enlargement Repol abnrm suggests ischemia, diffuse leads No significant change since last tracing Confirmed by Leanord Asal ([id number]) on 10/04/2022 9:13:31 AM  Radiology DG Chest 2 View  Result Date: 10/04/2022 CLINICAL DATA:  Shortness of breath EXAM: CHEST - 2 VIEW COMPARISON:  CXR 09/01/22 FINDINGS: Left-sided single lead cardiac device in place with likely unchanged lead positioning when accounting for differences in positioning. No pleural effusion. No pneumothorax. Unchanged slightly enlarged cardiac contours. Unchanged mediastinal contours. Compared to prior exam there are new bilateral perihilar and bibasilar airspace opacities, which could represent atypical infection or pulmonary edema. No radiographically apparent displaced rib fractures. Vertebral body heights are maintained. IMPRESSION: Compared to prior exam there are new bilateral perihilar and bibasilar airspace opacities, which could represent atypical infection or pulmonary edema. Electronically Signed   By: Marin Roberts M.D.   On: 10/04/2022 10:20    Procedures .Critical Care  Performed by: Chuck Hint, PA-C Authorized by: Chuck Hint, PA-C   Critical care provider statement:    Critical care time (minutes):  40   Critical care time was exclusive of:  Separately billable procedures and treating other patients   Critical care was necessary to treat or prevent imminent or life-threatening  deterioration of the following conditions:  Renal failure   Critical care was time spent personally by me on the following activities:  Blood draw for specimens, development of treatment plan with patient or surrogate, discussions with consultants, evaluation of patient's response to treatment, examination of patient, obtaining history from patient  or surrogate, review of old charts, re-evaluation of patient's condition, pulse oximetry, ordering and review of radiographic studies, ordering and review of laboratory studies and ordering and performing treatments and interventions   I assumed direction of critical care for this patient from another provider in my specialty: no     Care discussed with: admitting provider       Medications Ordered in ED Medications  sodium chloride 0.9 % bolus 1,000 mL ( Intravenous Stopped 10/04/22 1108)  metoCLOPramide (REGLAN) injection 10 mg (10 mg Intravenous Given 10/04/22 1017)  fentaNYL (SUBLIMAZE) injection 12.5 mcg (12.5 mcg Intravenous Given 10/04/22 1116)  prochlorperazine (COMPAZINE) injection 10 mg (10 mg Intravenous Given 10/04/22 1116)  dicyclomine (BENTYL) injection 20 mg (20 mg Intramuscular Given 10/04/22 1122)  furosemide (LASIX) injection 40 mg (40 mg Intravenous Given 10/04/22 1116)    ED Course/ Medical Decision Making/ A&P                             Medical Decision Making Amount and/or Complexity of Data Reviewed Labs: ordered. Radiology: ordered.  Risk Prescription drug management. Decision regarding hospitalization.   [name] 52 y.o. presented today for shortness of breath, abdominal pain, hematemesis. Working DDx that I considered at this time includes, but not limited to, PE, ACS, postop complication,.  Review of prior external notes: 09/10/2022 discharge summary  Unique Tests and My Interpretation: CBG: 302 Lactic acid: 1.2 Blood cultures: Pending UA: Hyperglycemia with protein CMP: GFR 15, increased glucose, results similar  to previous. D-dimer: Elevated 6.7 however most similar reads CBC with differential: Similar to previous readings BNP: Elevated 1496.7 Lipase: Unremarkable Respiratory panel: Troponin: 24, 26 Chest x-ray: New bilateral perihilar and bibasilar space opacities EKG: Sinus tachycardia 110 bpm, similar to previous reads  Discussion with Independent Historian: None  Discussion of Management of Tests: With, hospitalist  Risk: High:  - hospitalization or escalation of hospital-level care  Risk Stratification Score: None  Staffed with Leanord Asal, DO  R/o DDx: Sepsis: Patient does not meet sepsis criteria ACS: Negative troponin with unremarkable EKG  Plan: Patient presented for hematemesis and abdominal pain.  Patient recently had an AV fistula placed due to her decreased kidney function.  On exam patient appeared unwell with a heart rate of 108 and blood pressure of 184/103.  Suspect blood pressure is most likely due to patient's symptoms as opposed to any cardiogenic cause at this time.  Labs are ordered along with a chest x-ray.  Patient will be given fluids.  Patient was given Reglan for her nausea/vomiting and because she has a history of diabetic gastroparesis.  I anticipate patient will need inpatient stay as she most likely needs a VQ scan to evaluate for PE.  We cannot get a CTA in the ED because of her declining kidney function.  Chest x-ray showed pleural effusions and so fluids were stopped.  Patient also be given 12.5 of fentanyl for pain management.  Patient was given Compazine, Lasix, Bentyl for her symptoms along with the pleural effusions.  Patient will need inpatient stay for careful diuresis.  As for the patient's defibrillator going off after reading the report it does appear that there was activity on 10/01/2022 however that was when patient was undergoing surgery for the AV fistula and I suspect that is the cause for the defibrillator going off.  Hospitalist was  consulted and patient was accepted for care for diuresis and possible VQ scan to assess  for PE.  Patient stable at this time for admission.        Final Clinical Impression(s) / ED Diagnoses Final diagnoses:  Pleural effusion  Stage 5 chronic kidney disease not on chronic dialysis [facility])    Rx / DC Orders ED Discharge Orders     None         Chuck Hint, PA-C 10/04/22 1258    Leanord Asal K, DO 10/04/22 1531

## 2022-10-04 NOTE — H&P (Signed)
History and Physical    Patient: [name] [id number] DOB: 1970-09-17 DOA: 10/04/2022 DOS: the patient was seen and examined on 10/04/2022 PCP: [name], PA  Patient coming from: Outside Hospital  Chief Complaint:  Chief Complaint  Patient presents with   Abdominal Pain   AICD Problem   HPI: [name] is a 52 y.o. female with medical history significant for DM who presents to the hospital for nausea and vomiting (started at 2am). Pt attributes her symptoms to her known hx of gastroparesis.  She has not been able to keep anything down. She was seen at [facility] and tested positive for RSV. She also reports chest pain from coughing so much. Pain is substernal, pressures like. Aggravated with coughing and has no alleviating factors. Pain is rated 8/10. Her cough is productive of brown sputum.    Review of Systems: As mentioned in the history of present illness. All other systems reviewed and are negative. Past Medical History:  Diagnosis Date   Anemia    Anxiety    Arthritis    Asthma    CHF (congestive heart failure) ([place])    Chronic kidney disease    Depression    Diabetic ulcer of left foot ([place]) 05/12/2013   Gastroparesis    Generalized abdominal pain    History of chicken pox    Loss of weight 12/02/2019   Migraines    Mixed hyperlipidemia 09/05/2022   Mood disorder ([place])    anxiety   Myocardial infarction ([place])    Prurigo nodularis    with diabetic dermopathy   Type 1 diabetes, uncontrolled, with neuropathy    [place]   Ulcers of both lower legs ([place]) 02/20/2014   Past Surgical History:  Procedure Laterality Date   BIOPSY  08/11/2020   Procedure: BIOPSY;  Surgeon: Ladene Artist, MD;  [facility]: [facility];  Service: Endoscopy;;   BIOPSY  04/02/2022   Procedure: BIOPSY;  Surgeon: Jackquline Denmark, MD;  Location: [facility];  Service: Gastroenterology;;   ESOPHAGOGASTRODUODENOSCOPY (EGD) WITH PROPOFOL N/A 08/11/2020   Procedure:  ESOPHAGOGASTRODUODENOSCOPY (EGD) WITH PROPOFOL;  Surgeon: Ladene Artist, MD;  [facility];  Service: Endoscopy;  Laterality: N/A;   ESOPHAGOGASTRODUODENOSCOPY (EGD) WITH PROPOFOL N/A 04/02/2022   Procedure: ESOPHAGOGASTRODUODENOSCOPY (EGD) WITH PROPOFOL;  Surgeon: Jackquline Denmark, MD;  [facility];  Service: Gastroenterology;  Laterality: N/A;   ESOPHAGOGASTRODUODENOSCOPY (EGD) WITH PROPOFOL N/A 05/23/2022   Procedure: ESOPHAGOGASTRODUODENOSCOPY (EGD) WITH PROPOFOL;  Surgeon: Lucilla Lame, MD;  Location: [facility] ENDOSCOPY;  Service: Endoscopy;  Laterality: N/A;   ICD IMPLANT N/A 04/09/2022   Procedure: ICD IMPLANT;  Surgeon: Constance Haw, MD;  Location: [place] [facility];  Service: Cardiovascular;  Laterality: N/A;   treadmill stress test  01/2013   WNL, low risk study   Social History:  reports that she has never smoked. She has never used smokeless tobacco. She reports that she does not currently use alcohol. She reports that she does not use drugs.  Allergies  Allergen Reactions   Atorvastatin Other (See Comments)    Dizziness    Dulaglutide Nausea And Vomiting and Other (See Comments)    Caused pancreatitis    Sertraline     Other Reaction(s): vomiting   Zofran [Ondansetron] Nausea And Vomiting and Other (See Comments)    Causes nausea vomiting to worsen / doesn't really work for patient.   Amoxicillin Itching and Rash   Lantus [Insulin Glargine] Itching and Rash   Miconazole Nitrate Nausea And Vomiting and  Rash    Family History  Problem Relation Age of Onset   Diabetes Mother        type 2   Hypertension Mother    Thyroid disease Mother    Bipolar disorder Mother    Heart disease Mother    Calcium disorder Mother    Cancer Maternal Grandmother        Breast, stomach   Cancer Paternal Grandmother        stomach, lung (smoker)   Diabetes Paternal Grandmother    Diabetes Paternal Grandfather    Heart failure Sister    Diabetes Sister    Stroke  Maternal Aunt    Cancer Maternal Uncle        prostate   CAD Maternal Aunt        stents   Cancer Maternal Aunt 64       ovarian    Prior to Admission medications   Medication Sig Start Date End Date Taking? Authorizing Provider  acetaminophen (TYLENOL) 500 MG tablet as needed for moderate pain or mild pain.    [provider]  albuterol (PROAIR HFA) 108 (90 Base) MCG/ACT inhaler Inhale 2 puffs into the lungs every 6 (six) hours as needed for wheezing or shortness of breath. 07/03/20   [name], PA-C  Continuous Blood Gluc Sensor (FREESTYLE LIBRE 2 SENSOR) MISC Inject 1 Device into the skin every 14 (fourteen) days. 03/20/20   [provider]  Ensure Max Protein (ENSURE MAX PROTEIN) LIQD Take 330 mLs (11 oz total) by mouth daily. 04/30/22   Hongalgi, [name], MD  hydrALAZINE (APRESOLINE) 50 MG tablet Take 1 tablet (50 mg total) by mouth 3 (three) times daily. 07/17/22   Domenic Polite, MD  hydrOXYzine (ATARAX) 50 MG tablet Take 50 mg by mouth in the morning, at noon, and at bedtime.    [provider]  insulin aspart (NOVOLOG) 100 UNIT/ML injection Inject 150 Units into the skin See admin instructions. Patient uses this with the [place]. Units vary according to blood sugar. Loads 150 units every 3 days.    [provider]  LORazepam (ATIVAN) 0.5 MG tablet Take 1 tablet (0.5 mg total) by mouth every 8 (eight) hours as needed for anxiety (Refractory nausea and vomiting). 07/17/22   Domenic Polite, MD  metoCLOPramide (REGLAN) 10 MG tablet Take 1 tablet (10 mg total) by mouth 3 (three) times daily before meals. 07/17/22   Domenic Polite, MD  metoprolol succinate (TOPROL-XL) 50 MG 24 hr tablet Take 1 tablet (50 mg total) by mouth daily. Take with or immediately following a meal. 09/05/22   Chandrasekhar, Mahesh A, MD  Multiple Vitamin (MULTIVITAMIN WITH MINERALS) TABS tablet Take 1 tablet by mouth daily. 04/30/22   Hongalgi, [name], MD  nitroGLYCERIN  (NITROSTAT) 0.4 MG SL tablet Place 1 tablet (0.4 mg total) under the tongue every 5 (five) minutes as needed for chest pain. 09/05/22 12/04/22  Werner Lean, MD  oxyCODONE-acetaminophen (PERCOCET) 5-325 MG tablet Take 1 tablet by mouth every 6 (six) hours as needed for severe pain. 10/01/22   Rosetta Posner, MD  pantoprazole (PROTONIX) 40 MG tablet Take 1 tablet (40 mg total) by mouth 2 (two) times daily before a meal. 05/24/22 09/08/22  Rai, Ripudeep K, MD  polyethylene glycol (MIRALAX / GLYCOLAX) 17 g packet Take 17 g by mouth daily as needed for moderate constipation.    [provider]  pravastatin (PRAVACHOL) 40 MG tablet Take 1 tablet (40 mg total)  by mouth every evening. 09/05/22   Chandrasekhar, Lyda Kalata A, MD  pregabalin (LYRICA) 50 MG capsule Take 50 mg by mouth 3 (three) times daily as needed (neuropathy). 12/05/21   [provider]  prochlorperazine (COMPAZINE) 10 MG tablet Take 1 tablet (10 mg total) by mouth every 6 (six) hours as needed for nausea or vomiting. 05/24/22   Rai, [name], MD  promethazine (PHENERGAN) 12.5 MG tablet Take 12.5 mg by mouth 2 (two) times daily as needed for nausea or vomiting.    [provider]  promethazine (PHENERGAN) 25 MG suppository Unwrap and place 1 suppository (25 mg total) rectally every 6 (six) hours as needed for nausea or vomiting. 07/17/22   Domenic Polite, MD  scopolamine (TRANSDERM-SCOP) 1 MG/3DAYS Place 1 patch (1.5 mg total) onto the skin every 3 (three) days. 05/26/22   Rai, Ripudeep K, MD  tobramycin (TOBREX) 0.3 % ophthalmic solution Place 1-2 drops into both eyes See admin instructions. Instill 1-2 drops into both eyes 3 times daily the day before, day of, and the day after every 6 weeks eye injections per patient 11/17/20   [provider]  torsemide (DEMADEX) 20 MG tablet TAKE THREE TABLETS BY MOUTH DAILY 08/30/22   Darrick Grinder D, NP    Physical Exam: Vitals:   10/04/22 1400 10/04/22 1429 10/04/22 1559  10/04/22 1736  BP: (!) 191/119   (!) 172/98  Pulse: (!) 114   (!) 109  Resp: (!) 26   (!) 24  Temp:  98.5 F (36.9 C) (!) 101.3 F (38.5 C) 99.2 F (37.3 C)  TempSrc:  Oral Oral Oral  SpO2: 99%   97%   Physical Exam Constitutional:      Appearance: She is ill-appearing.  HENT:     Head: Atraumatic.  Cardiovascular:     Rate and Rhythm: Tachycardia present.  Pulmonary:     Breath sounds: Rhonchi and rales present.  Abdominal:     General: Abdomen is flat.     Palpations: Abdomen is soft.     Tenderness: There is abdominal tenderness.  Skin:    General: Skin is warm.  Neurological:     Mental Status: She is alert and oriented to person, place, and time.  Psychiatric:        Mood and Affect: Mood normal.     Data Reviewed:  There are no new results to review at this time.  Assessment and Plan: RSV pneumonia  - Supportive care  - Oxygen to maintain O2 sat >94% - IV doxycycline 100 mg q12 - Xopenex q8hr   - Robitussin 10 mL PO q6 hr   DM2 with gastroparesis  - A1c - Novolog SS ACHS  - Glargine 10 units sq daily  - Pravastatin 40 mg PO daily   AKI on CKD 5 - Complicating care  - IV NS 75 cc/hr   Elevated troponin  - Likely demand from the pt's elevated BP and RSV infection  - ECHO   HTN urgency  - Hydralazine 50 mg PO q8hr  - Isordil 10 mg PO tid . - Toprol XL 50 mg PO daily   6. Nausea and vomiting  - CT abd/pelvis without contrast  - IV ativan 0.5 mg q6 hr PRN  - IV reglan 5 mg q6 hr  - IV Compazine 5 mg q6 hr PRN  - Scopolamine patch 1.5 mg q72 hr   DVT prophylaxis: Heparin 5000 units sq q12  GI prophylaxis: IV protonix 40 mg daily  Advance Care Planning: FULL  Severity of Illness: The appropriate patient status for this patient is INPATIENT. Inpatient status is judged to be reasonable and necessary in order to provide the required intensity of service to ensure the patient's safety. The patient's presenting symptoms, physical exam findings,  and initial radiographic and laboratory data in the context of their chronic comorbidities is felt to place them at high risk for further clinical deterioration. Furthermore, it is not anticipated that the patient will be medically stable for discharge from the hospital within 2 midnights of admission.   * I certify that at the point of admission it is my clinical judgment that the patient will require inpatient hospital care spanning beyond 2 midnights from the point of admission due to high intensity of service, high risk for further deterioration and high frequency of surveillance required.*  Author: Lucienne Minks , MD 10/04/2022 6:09 PM  For on call review [email or web address].

## 2022-10-04 NOTE — Inpatient Diabetes Management (Signed)
Inpatient Diabetes Program Recommendations  AACE/ADA: New Consensus Statement on Inpatient Glycemic Control (2015)  Target Ranges:  Prepandial:   less than 140 mg/dL      Peak postprandial:   less than 180 mg/dL (1-2 hours)      Critically ill patients:  140 - 180 mg/dL   Lab Results  Component Value Date   GLUCAP 302 (H) 10/04/2022   HGBA1C 6.4 (H) 07/12/2022    Review of Glycemic Control  Latest Reference Range & Units 10/04/22 10:28  Glucose-Capillary 70 - 99 mg/dL 302 (H)  (H): Data is abnormally high Diabetes history: type 1 Outpatient Diabetes medications: Omnipod insulin pump with Novolog(Total basal= 24 units), FreeStyle Libre3 CGM  Current orders for Inpatient glycemic control: None   Inpatient Diabetes Program Recommendations:     If patient to remain inpatient, consider:  -Semglee 16 units QD. -Novolog 0-6 units Q4H  Secure chat sent to RN to verify patient is not currently wearing insulin pump and last removed it 2/29 '@1800'$ . Secure chat sent to MD with recs.    Thanks, Bronson Curb, MSN, RNC-OB Diabetes Coordinator [phone] (8a-5p)

## 2022-10-04 NOTE — Progress Notes (Signed)
Pt arrived to floor around 1718 via stretcher by carelink. Pt transferred from stretcher to hospital bed x 2 assist d/t weakness. Pt reports nausea. EMT reported giving pt Reglan IV 20 minutes PTA. Pt alert and oriented x4. VSS. Respirations even and unlabored on 3 L/min. Bed in low position and call bell within reach.

## 2022-10-05 ENCOUNTER — Inpatient Hospital Stay (HOSPITAL_COMMUNITY): Payer: BC Managed Care – PPO

## 2022-10-05 DIAGNOSIS — R079 Chest pain, unspecified: Secondary | ICD-10-CM

## 2022-10-05 DIAGNOSIS — J121 Respiratory syncytial virus pneumonia: Secondary | ICD-10-CM | POA: Diagnosis not present

## 2022-10-05 LAB — CBC
HCT: 23.1 % — ABNORMAL LOW (ref 36.0–46.0)
Hemoglobin: 7.7 g/dL — ABNORMAL LOW (ref 12.0–15.0)
MCH: 29.5 pg (ref 26.0–34.0)
MCHC: 33.3 g/dL (ref 30.0–36.0)
MCV: 88.5 fL (ref 80.0–100.0)
Platelets: 236 10*3/uL (ref 150–400)
RBC: 2.61 MIL/uL — ABNORMAL LOW (ref 3.87–5.11)
RDW: 15.6 % — ABNORMAL HIGH (ref 11.5–15.5)
WBC: 7.1 10*3/uL (ref 4.0–10.5)
nRBC: 0 % (ref 0.0–0.2)

## 2022-10-05 LAB — GLUCOSE, CAPILLARY
Glucose-Capillary: 206 mg/dL — ABNORMAL HIGH (ref 70–99)
Glucose-Capillary: 161 mg/dL — ABNORMAL HIGH (ref 70–99)
Glucose-Capillary: 158 mg/dL — ABNORMAL HIGH (ref 70–99)
Glucose-Capillary: 168 mg/dL — ABNORMAL HIGH (ref 70–99)

## 2022-10-05 LAB — C-REACTIVE PROTEIN: CRP: 2.8 mg/dL — ABNORMAL HIGH (ref <1.0)

## 2022-10-05 LAB — COMPREHENSIVE METABOLIC PANEL
ALT: 11 U/L (ref 0–44)
AST: 14 U/L — ABNORMAL LOW (ref 15–41)
Albumin: 2.1 g/dL — ABNORMAL LOW (ref 3.5–5.0)
Alkaline Phosphatase: 63 U/L (ref 38–126)
Anion gap: 9 (ref 5–15)
BUN: 39 mg/dL — ABNORMAL HIGH (ref 6–20)
CO2: 22 mmol/L (ref 22–32)
Calcium: 8 mg/dL — ABNORMAL LOW (ref 8.9–10.3)
Chloride: 109 mmol/L (ref 98–111)
Creatinine, Ser: 4.12 mg/dL — ABNORMAL HIGH (ref 0.44–1.00)
GFR, Estimated: 12 mL/min — ABNORMAL LOW (ref >60)
Glucose, Bld: 184 mg/dL — ABNORMAL HIGH (ref 70–99)
Potassium: 3.8 mmol/L (ref 3.5–5.1)
Sodium: 140 mmol/L (ref 135–145)
Total Bilirubin: 0.9 mg/dL (ref 0.3–1.2)
Total Protein: 5.1 g/dL — ABNORMAL LOW (ref 6.5–8.1)

## 2022-10-05 LAB — ECHOCARDIOGRAM COMPLETE
Area-P 1/2: 6.96 cm2
Calc EF: 36.6 %
Height: 72 in
S' Lateral: 3.8 cm
Single Plane A2C EF: 36.8 %
Single Plane A4C EF: 31 %
Weight: 2871.27 oz

## 2022-10-05 LAB — PHOSPHORUS: Phosphorus: 3.8 mg/dL (ref 2.5–4.6)

## 2022-10-05 LAB — MAGNESIUM: Magnesium: 1.8 mg/dL (ref 1.7–2.4)

## 2022-10-05 MED ORDER — ACETAMINOPHEN 10 MG/ML IV SOLN
1000.0000 mg | Freq: Once | INTRAVENOUS | Status: AC
  Administered 2022-10-05: 1000 mg via INTRAVENOUS
  Filled 2022-10-05: qty 100

## 2022-10-05 MED ORDER — FUROSEMIDE 10 MG/ML IJ SOLN
40.0000 mg | Freq: Two times a day (BID) | INTRAMUSCULAR | Status: DC
  Administered 2022-10-05 – 2022-10-06 (×3): 40 mg via INTRAVENOUS
  Filled 2022-10-05 (×3): qty 4

## 2022-10-05 MED ORDER — SODIUM CHLORIDE 0.9 % IV SOLN
1.0000 g | Freq: Three times a day (TID) | INTRAVENOUS | Status: DC
  Administered 2022-10-05 (×3): 1 g via INTRAVENOUS
  Filled 2022-10-05 (×5): qty 5

## 2022-10-05 MED ORDER — ACETAMINOPHEN 325 MG PO TABS
325.0000 mg | ORAL_TABLET | Freq: Once | ORAL | Status: AC
  Administered 2022-10-05: 325 mg via ORAL
  Filled 2022-10-05: qty 1

## 2022-10-05 NOTE — Plan of Care (Signed)

## 2022-10-05 NOTE — Progress Notes (Signed)
  Progress Note   Patient: Yazleemar Brodt [id number] DOB: 1971/06/30 DOA: 10/04/2022     1 DOS: the patient was seen and examined on 10/05/2022   Brief hospital course: No notes on file  Assessment and Plan: RSV pneumonia  - Supportive care  - Oxygen to maintain O2 sat >94% - IV doxycycline 100 mg q12 - IV aztreonam 1 g q8 hr  - Xopenex q8hr   - Robitussin 10 mL PO q6 hr    DM2 with gastroparesis  - A1c pending  - Novolog SS ACHS  - Glargine 10 units sq daily  - Pravastatin 40 mg PO daily    AKI on CKD 5 - Complicating care    Elevated troponin  - Likely demand from the pt's elevated BP and RSV infection  - ECHO pending    HTN urgency  - Hydralazine 50 mg PO q8hr  - Isordil 10 mg PO tid . - Toprol XL 50 mg PO daily    6. Nausea and vomiting  - CT abd/pelvis without contrast appreciated  - IV ativan 0.5 mg q6 hr PRN  - IV reglan 5 mg q6 hr  - IV Compazine 5 mg q6 hr PRN  - Scopolamine patch 1.5 mg q72 hr    DVT prophylaxis: Heparin 5000 units sq q12  GI prophylaxis: IV protonix 40 mg daily        Subjective: Pt seen and examined at the bedside.Clinically she feels slightly better today. IV fluids stopped and IV lasix started. Pt asking for food. Diet advanced to clears. Pt was offered BIPAP but refused as she is claustrophobic.   Physical Exam: Vitals:   10/05/22 0626 10/05/22 0630 10/05/22 0700 10/05/22 0831  BP:  125/69 114/64 (!) 113/58  Pulse:  (!) 102 (!) 102   Resp:  (!) 26 (!) 26   Temp: (!) 102 F (38.9 C)  (!) 101.3 F (38.5 C) (!) 100.7 F (38.2 C)  TempSrc: Axillary  Oral   SpO2:  97% 100%   Weight:      Height:       Constitutional:      Appearance: She is ill-appearing.  HENT:     Head: Atraumatic.  Cardiovascular:     Rate and Rhythm: Intermittent tachycardia  Pulmonary:     Breath sounds: Rhonchi and rales present.  Abdominal:     General: Abdomen is flat.     Palpations: Abdomen is soft.     Tenderness: There is abdominal  tenderness.  Skin:    General: Skin is warm.  Neurological:     Mental Status: She is alert and oriented to person, place, and time.  Psychiatric:        Mood and Affect: Mood normal.   Data Reviewed:   Disposition: Status is: Inpatient  Planned Discharge Destination: Home    Time spent: 35 minutes  Author: Lucienne Minks , MD 10/05/2022 10:04 AM  For on call review [email or web address].

## 2022-10-05 NOTE — Progress Notes (Addendum)
Patient had 3 vomiting episodes at beginning of shift with fever. Strong tremors noted with chills. Despite intervention patient has been afebrile the entire shift. Notified provider earlier in shift for Fever and additional dose '325mg'$  of tylenol given on top of the 625 she received at 8pm and last tylenol given was at 0153. Patient had CT scan, urine is cloudy in suction cannister. Also noted no thrill or bruit in left upper arm fistula that was placed on tuesday. Paged and messaged provider, awaiting response concerning fever, hgb dropped, fistula, fever and d dimer level on admission.    06:40 spoke with provider on the phone, awaiting day shfit providers. Patients temp 102 axillary. Placed 4 ice packs on patient.  Tylenol cannot be given again until 07:58  06:48 giving dose of tylenol per physician

## 2022-10-06 ENCOUNTER — Inpatient Hospital Stay (HOSPITAL_COMMUNITY): Payer: BC Managed Care – PPO

## 2022-10-06 DIAGNOSIS — I5020 Unspecified systolic (congestive) heart failure: Secondary | ICD-10-CM | POA: Diagnosis not present

## 2022-10-06 DIAGNOSIS — J121 Respiratory syncytial virus pneumonia: Secondary | ICD-10-CM | POA: Diagnosis not present

## 2022-10-06 DIAGNOSIS — I771 Stricture of artery: Secondary | ICD-10-CM

## 2022-10-06 LAB — CBC
HCT: 23.4 % — ABNORMAL LOW (ref 36.0–46.0)
Hemoglobin: 7.4 g/dL — ABNORMAL LOW (ref 12.0–15.0)
MCH: 28.4 pg (ref 26.0–34.0)
MCHC: 31.6 g/dL (ref 30.0–36.0)
MCV: 89.7 fL (ref 80.0–100.0)
Platelets: 207 10*3/uL (ref 150–400)
RBC: 2.61 MIL/uL — ABNORMAL LOW (ref 3.87–5.11)
RDW: 15.7 % — ABNORMAL HIGH (ref 11.5–15.5)
WBC: 6.7 10*3/uL (ref 4.0–10.5)
nRBC: 0 % (ref 0.0–0.2)

## 2022-10-06 LAB — COMPREHENSIVE METABOLIC PANEL
ALT: 36 U/L (ref 0–44)
AST: 42 U/L — ABNORMAL HIGH (ref 15–41)
Albumin: 2.2 g/dL — ABNORMAL LOW (ref 3.5–5.0)
Alkaline Phosphatase: 136 U/L — ABNORMAL HIGH (ref 38–126)
Anion gap: 15 (ref 5–15)
BUN: 48 mg/dL — ABNORMAL HIGH (ref 6–20)
CO2: 16 mmol/L — ABNORMAL LOW (ref 22–32)
Calcium: 8 mg/dL — ABNORMAL LOW (ref 8.9–10.3)
Chloride: 106 mmol/L (ref 98–111)
Creatinine, Ser: 4.73 mg/dL — ABNORMAL HIGH (ref 0.44–1.00)
GFR, Estimated: 11 mL/min — ABNORMAL LOW (ref >60)
Glucose, Bld: 227 mg/dL — ABNORMAL HIGH (ref 70–99)
Potassium: 4.3 mmol/L (ref 3.5–5.1)
Sodium: 137 mmol/L (ref 135–145)
Total Bilirubin: 0.7 mg/dL (ref 0.3–1.2)
Total Protein: 5.2 g/dL — ABNORMAL LOW (ref 6.5–8.1)

## 2022-10-06 LAB — GLUCOSE, CAPILLARY
Glucose-Capillary: 131 mg/dL — ABNORMAL HIGH (ref 70–99)
Glucose-Capillary: 135 mg/dL — ABNORMAL HIGH (ref 70–99)
Glucose-Capillary: 147 mg/dL — ABNORMAL HIGH (ref 70–99)
Glucose-Capillary: 218 mg/dL — ABNORMAL HIGH (ref 70–99)
Glucose-Capillary: 263 mg/dL — ABNORMAL HIGH (ref 70–99)

## 2022-10-06 LAB — MAGNESIUM: Magnesium: 1.7 mg/dL (ref 1.7–2.4)

## 2022-10-06 LAB — PHOSPHORUS: Phosphorus: 5.4 mg/dL — ABNORMAL HIGH (ref 2.5–4.6)

## 2022-10-06 LAB — C-REACTIVE PROTEIN: CRP: 9.8 mg/dL — ABNORMAL HIGH (ref <1.0)

## 2022-10-06 MED ORDER — SODIUM BICARBONATE 650 MG PO TABS
1300.0000 mg | ORAL_TABLET | Freq: Two times a day (BID) | ORAL | Status: DC
  Administered 2022-10-06 – 2022-10-09 (×7): 1300 mg via ORAL
  Filled 2022-10-06 (×7): qty 2

## 2022-10-06 MED ORDER — FUROSEMIDE 10 MG/ML IJ SOLN
80.0000 mg | Freq: Two times a day (BID) | INTRAMUSCULAR | Status: DC
  Administered 2022-10-06 – 2022-10-11 (×12): 80 mg via INTRAVENOUS
  Filled 2022-10-06 (×12): qty 8

## 2022-10-06 MED ORDER — GUAIFENESIN 100 MG/5ML PO LIQD
10.0000 mL | Freq: Four times a day (QID) | ORAL | Status: AC
  Administered 2022-10-06 – 2022-10-08 (×6): 10 mL via ORAL
  Filled 2022-10-06 (×6): qty 10

## 2022-10-06 MED ORDER — SODIUM CHLORIDE 0.9 % IV SOLN
2.0000 g | Freq: Every day | INTRAVENOUS | Status: DC
  Administered 2022-10-06: 2 g via INTRAVENOUS
  Filled 2022-10-06: qty 20

## 2022-10-06 MED ORDER — METOCLOPRAMIDE HCL 5 MG/ML IJ SOLN
5.0000 mg | Freq: Four times a day (QID) | INTRAMUSCULAR | Status: AC
  Administered 2022-10-06 – 2022-10-07 (×5): 5 mg via INTRAVENOUS
  Filled 2022-10-06 (×5): qty 2

## 2022-10-06 MED ORDER — LEVALBUTEROL HCL 0.63 MG/3ML IN NEBU
0.6300 mg | INHALATION_SOLUTION | Freq: Two times a day (BID) | RESPIRATORY_TRACT | Status: DC
  Administered 2022-10-06 – 2022-10-10 (×9): 0.63 mg via RESPIRATORY_TRACT
  Filled 2022-10-06 (×9): qty 3

## 2022-10-06 MED ORDER — ALBUMIN HUMAN 25 % IV SOLN
25.0000 g | Freq: Four times a day (QID) | INTRAVENOUS | Status: AC
  Administered 2022-10-06 (×2): 25 g via INTRAVENOUS
  Filled 2022-10-06 (×2): qty 100

## 2022-10-06 NOTE — Consult Note (Addendum)
[facility] Nephrology Consultation Note  Requesting MD: Dr. Lucienne Minks Reason for consult: CKD IV-V  HPI:  [name] is a 52 y.o. 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  For CKD, she follows with me at [facility].  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  Her creatinine level has been fluctuating between 3-4 with multiple recent hospitalization related with CHF extubation, gastroparesis flare with nausea and vomiting etc.  She underwent left first stage brachial basilic fistula creation by Dr. Donnetta Hutching on 10/01/2022.  She was also referred to [facility] for kidney transplant evaluation which was denied because of multiple recent hospitalization, nausea, vomiting etc.  She is on multiple cardiac medication including torsemide and receives erythropoietin for the management of anemia due to CKD.  She now presented with worsening nausea, vomiting and also abnormal ICD sound.  She was tested positive for RSV.  Also having more coughing which was productive.  Subjective fever.  The chest x-ray on 3/1 with airspace opacity possibility of atypical infection versus pulm edema. The CT scan done on 3/2 with moderate bilateral pleural effusion with associated with compressive atelectasis, patchy anterior right lower lobe opacity reflecting pneumonia. The lab showed potassium 4.3, CO2 16, BUN 48, creatinine level 4.73, CRP 9.8, WBC 6.7, hemoglobin 7.4. She is currently receiving ceftriaxone, doxycycline.  On IV Lasix 40 mg twice a day. The patient is complaining of worsening shortness of  breath associated with nausea and vomiting.  Denies chest pain.  She is requiring around 1 to 2 L of oxygen.  Urine output is recorded around 800 cc.  PMHx:   Past Medical History:  Diagnosis Date   Anemia    Anxiety    Arthritis    Asthma    CHF (congestive heart failure) ([place])    Chronic kidney disease    Depression    Diabetic ulcer of left foot ([place]) 05/12/2013   Gastroparesis    Generalized abdominal pain    History of chicken pox    Loss of weight 12/02/2019   Migraines    Mixed hyperlipidemia 09/05/2022   Mood disorder ([place])    anxiety   Myocardial infarction ([place])    Prurigo nodularis    with diabetic dermopathy   Type 1 diabetes, uncontrolled, with neuropathy    [place]   Ulcers of both lower legs ([place]) 02/20/2014    Past Surgical History:  Procedure Laterality Date   BIOPSY  08/11/2020   Procedure: BIOPSY;  Surgeon: Ladene Artist, MD;  Location: [facility];  Service: Endoscopy;;   BIOPSY  04/02/2022   Procedure: BIOPSY;  Surgeon: Jackquline Denmark, MD;  Location: [facility];  Service: Gastroenterology;;   ESOPHAGOGASTRODUODENOSCOPY (EGD) WITH PROPOFOL N/A 08/11/2020   Procedure: ESOPHAGOGASTRODUODENOSCOPY (EGD) WITH PROPOFOL;  Surgeon: Ladene Artist, MD;  Location: [facility];  Service: Endoscopy;  Laterality: N/A;   ESOPHAGOGASTRODUODENOSCOPY (EGD) WITH PROPOFOL N/A 04/02/2022   Procedure: ESOPHAGOGASTRODUODENOSCOPY (EGD) WITH PROPOFOL;  Surgeon: Jackquline Denmark, MD;  Location: [facility];  Service: Gastroenterology;  Laterality: N/A;   ESOPHAGOGASTRODUODENOSCOPY (EGD) WITH PROPOFOL N/A 05/23/2022   Procedure: ESOPHAGOGASTRODUODENOSCOPY (EGD) WITH PROPOFOL;  Surgeon: Lucilla Lame, MD;  Location: [facility];  Service: Endoscopy;  Laterality: N/A;   ICD IMPLANT N/A 04/09/2022   Procedure: ICD IMPLANT;  Surgeon: Constance Haw, MD;  Location: [facility];  Service: Cardiovascular;  Laterality: N/A;   treadmill stress test  01/2013   WNL, low risk  study    Family Hx:  Family History  Problem Relation Age of Onset   Diabetes Mother        type 2   Hypertension Mother    Thyroid disease Mother    Bipolar disorder Mother    Heart disease Mother    Calcium disorder Mother    Cancer Maternal Grandmother        Breast, stomach   Cancer Paternal Grandmother        stomach, lung (smoker)   Diabetes Paternal Grandmother    Diabetes Paternal Grandfather    Heart failure Sister    Diabetes Sister    Stroke Maternal Aunt    Cancer Maternal Uncle        prostate   CAD Maternal Aunt        stents   Cancer Maternal Aunt 64       ovarian    Social History:  reports that she has never smoked. She has never used smokeless tobacco. She reports that she does not currently use alcohol. She reports that she does not use drugs.  Allergies:  Allergies  Allergen Reactions   Atorvastatin Other (See Comments)    Dizziness    Dulaglutide Nausea And Vomiting and Other (See Comments)    Caused pancreatitis    Metoclopramide Other (See Comments)    Possible movement disorder or tardive dyskinesia while on reglan   Sertraline Nausea And Vomiting   Zofran [Ondansetron] Nausea And Vomiting and Other (See Comments)    Causes nausea vomiting to worsen / doesn't really work for patient.   Amoxicillin Itching and Rash   Lantus [Insulin Glargine] Itching and Rash    Has tolerated insulin glargine many times with no reported issues   Miconazole Nitrate Nausea And Vomiting and Rash    Medications: Prior to Admission medications   Medication Sig Start Date End Date Taking? Authorizing Provider  cloNIDine (CATAPRES - DOSED IN MG/24 HR) 0.1 mg/24hr patch 0.1 mg once a week. 09/27/22  Yes [provider]  acetaminophen (TYLENOL) 500 MG tablet as needed for moderate pain or mild pain.    [provider]  albuterol (PROAIR HFA) 108 (90 Base) MCG/ACT inhaler Inhale 2 puffs into the lungs every 6 (six) hours as needed for wheezing or  shortness of breath. 07/03/20   [name], PA-C  Continuous Blood Gluc Sensor (FREESTYLE LIBRE 2 SENSOR) MISC Inject 1 Device into the skin every 14 (fourteen) days. 03/20/20   [provider]  Ensure Max Protein (ENSURE MAX PROTEIN) LIQD Take 330 mLs (11 oz total) by mouth daily. 04/30/22   Hongalgi, [name], MD  hydrALAZINE (APRESOLINE) 50 MG tablet Take 1 tablet (50 mg total) by mouth 3 (three) times daily. 07/17/22   Domenic Polite, MD  hydrOXYzine (ATARAX) 50 MG tablet Take 50 mg by mouth in the morning, at noon, and at bedtime.    [provider]  insulin aspart (NOVOLOG) 100 UNIT/ML injection Inject 150 Units into the skin See admin instructions. Patient uses this with the [place]. Units vary according to blood sugar. Loads 150 units every 3 days.    [provider]  LORazepam (ATIVAN) 0.5 MG tablet Take 1 tablet (0.5  mg total) by mouth every 8 (eight) hours as needed for anxiety (Refractory nausea and vomiting). 07/17/22   Domenic Polite, MD  metoCLOPramide (REGLAN) 10 MG tablet Take 1 tablet (10 mg total) by mouth 3 (three) times daily before meals. 07/17/22   Domenic Polite, MD  metoprolol succinate (TOPROL-XL) 50 MG 24 hr tablet Take 1 tablet (50 mg total) by mouth daily. Take with or immediately following a meal. 09/05/22   Chandrasekhar, Mahesh A, MD  Multiple Vitamin (MULTIVITAMIN WITH MINERALS) TABS tablet Take 1 tablet by mouth daily. 04/30/22   Hongalgi, [name], MD  nitroGLYCERIN (NITROSTAT) 0.4 MG SL tablet Place 1 tablet (0.4 mg total) under the tongue every 5 (five) minutes as needed for chest pain. 09/05/22 12/04/22  Werner Lean, MD  oxyCODONE-acetaminophen (PERCOCET) 5-325 MG tablet Take 1 tablet by mouth every 6 (six) hours as needed for severe pain. 10/01/22   Rosetta Posner, MD  pantoprazole (PROTONIX) 40 MG tablet Take 1 tablet (40 mg total) by mouth 2 (two) times daily before a meal. 05/24/22 09/08/22  Rai, Ripudeep K, MD  polyethylene  glycol (MIRALAX / GLYCOLAX) 17 g packet Take 17 g by mouth daily as needed for moderate constipation.    [provider]  pravastatin (PRAVACHOL) 40 MG tablet Take 1 tablet (40 mg total) by mouth every evening. 09/05/22   Chandrasekhar, Lyda Kalata A, MD  pregabalin (LYRICA) 50 MG capsule Take 50 mg by mouth 3 (three) times daily as needed (neuropathy). 12/05/21   [provider]  prochlorperazine (COMPAZINE) 10 MG tablet Take 1 tablet (10 mg total) by mouth every 6 (six) hours as needed for nausea or vomiting. 05/24/22   Rai, [name], MD  promethazine (PHENERGAN) 12.5 MG tablet Take 12.5 mg by mouth 2 (two) times daily as needed for nausea or vomiting.    [provider]  promethazine (PHENERGAN) 25 MG suppository Unwrap and place 1 suppository (25 mg total) rectally every 6 (six) hours as needed for nausea or vomiting. 07/17/22   Domenic Polite, MD  scopolamine (TRANSDERM-SCOP) 1 MG/3DAYS Place 1 patch (1.5 mg total) onto the skin every 3 (three) days. 05/26/22   Rai, Ripudeep K, MD  tobramycin (TOBREX) 0.3 % ophthalmic solution Place 1-2 drops into both eyes See admin instructions. Instill 1-2 drops into both eyes 3 times daily the day before, day of, and the day after every 6 weeks eye injections per patient 11/17/20   [provider]  torsemide (DEMADEX) 20 MG tablet TAKE THREE TABLETS BY MOUTH DAILY 08/30/22   Darrick Grinder D, NP    I have reviewed the patient's current medications.  Labs: Renal Panel: Recent Labs  Lab 10/01/22 1019 10/04/22 1000 10/05/22 0242 10/06/22 0730  NA 137 140 140 137  K 4.0 4.1 3.8 4.3  CL 104 107 109 106  CO2 21* 23 22 16*  GLUCOSE 245* 288* 184* 227*  BUN 51* 37* 39* 48*  CREATININE 4.11* 3.61* 4.12* 4.73*  CALCIUM 8.7* 8.9 8.0* 8.0*  MG  --   --  1.8 1.7  PHOS  --   --  3.8 5.4*     CBC:    Latest Ref Rng & Units 10/06/2022    7:30 AM 10/05/2022    2:42 AM 10/04/2022   10:00 AM  CBC  WBC 4.0 - 10.5 K/uL 6.7  7.1  7.2    Hemoglobin 12.0 - 15.0 g/dL 7.4  7.7  9.4   Hematocrit 36.0 - 46.0 % 23.4  23.1  28.6   Platelets 150 - 400 K/uL 207  236  288      Anemia Panel:  Recent Labs    04/08/22 1800 04/09/22 0022 07/13/22 0825 07/14/22 0041 09/20/22 1149 10/01/22 1019 10/04/22 1000 10/05/22 0242 10/06/22 0730  HGB  --    < >  --    < > 8.6* 9.3* 9.4* 7.7* 7.4*  MCV  --    < >  --    < >  --   --  86.9 88.5 89.7  VITAMINB12  --   --  378  --   --   --   --   --   --   FOLATE  --   --  8.6  --   --   --   --   --   --   FERRITIN 108  --  134  --   --   --   --   --   --   TIBC 218*  --  221*  --   --   --   --   --   --   IRON 32  --  29  --   --   --   --   --   --   RETICCTPCT  --   --  2.6  --   --   --   --   --   --    < > = values in this interval not displayed.    Recent Labs  Lab 10/04/22 1000 10/05/22 0242 10/06/22 0730  AST 9* 14* 42*  ALT 14 11 36  ALKPHOS 83 63 136*  BILITOT 1.0 0.9 0.7  PROT 7.0 5.1* 5.2*  ALBUMIN 3.6 2.1* 2.2*    Lab Results  Component Value Date   HGBA1C 6.4 (H) 07/12/2022    ROS:  Pertinent items noted in HPI and remainder of comprehensive ROS otherwise negative.  Physical Exam: Vitals:   10/06/22 0710 10/06/22 0835  BP:  137/80  Pulse:  99  Resp:  (!) 27  Temp:  99.3 F (37.4 C)  SpO2: 99% 96%     General exam: Looks mildly distressed, on oxygen.  Able to speak full sentence Respiratory system: Bibasilar decreased breath sound, no wheezing.  Cardiovascular system: S1 & S2 heard, RRR.  Trace pedal edema. Gastrointestinal system: Abdomen is nondistended, soft and nontender. Normal bowel sounds heard. Central nervous system: Alert and oriented. No focal neurological deficits. Extremities: Symmetric 5 x 5 power. Skin: No rashes, lesions or ulcers Psychiatry: Judgement and insight appear normal. Mood & affect appropriate.  Vascular access: Left upper extremity AV fistula has dressing on.  There was no thrill or bruit.  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 Recent Left AVF placed by Dr. Donnetta Hutching.  Recent progressive CKD in the setting of multiple hospitalization, ongoing gastroparesis flare etc.  She is on antibiotics for pneumonia.  I will increase Lasix to 80 mg IV twice a day and add IV albumin.  I have discussed with the patient that if there is no improvement in her symptoms and her kidney function continue to decline then she will need dialysis as early as tomorrow.  In this case she will need tunneled HD catheter placement.  I will keep her n.p.o. postmidnight.  Patient agreed with the plan.  # Vascular access: Status post left first stage brachiobasilic fistula created by  Dr. Donnetta Hutching on 10/01/2022.  The fistula has no thrill or bruit.  I am going to get duplex study of AV fistula.  I have discussed with Dr. Carlis Abbott for his evaluation.  # Possible pneumonia/acute hypoxic respiratory failure/RSV+: On ceftriaxone and Doxy by primary team.  Currently on oxygen.  Hopefully IV Lasix will help for volume management.  # Acute on chronic diastolic CHF with fluid overload/pleural effusion: IV diuretics.  Echo with EF of 40 to 45%, mild concentric LVH.  Noted cardiology is consulted.  # Metabolic acidosis: Will add sodium bicarbonate.  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  Corrected calcium and phosphorus level acceptable for CKD.  # Anemia of CKD: I will check iron studies.  She receives erythropoietin as outpatient.  # Hypertension/volume: Currently on cardiac medication including metoprolol, Imdur, hydralazine.  Diuretics as above.  Blood pressure acceptable.  Thank you for the consult.  We will continue to follow.  Emmylou Bieker Tanna Furry 10/06/2022, 10:33 AM  [facility].

## 2022-10-06 NOTE — Progress Notes (Signed)
A dialysis pt. With left arm restriction and swollen/edematous right forearm, able to obtain PIV on right distal lateral forearm w/ [place]. Pt. Noted with poor vasculature and limited vein access, highly recommend to have a CVC for multiple IV meds and reasons stated earlier. RN made aware. MD to notify.

## 2022-10-06 NOTE — Progress Notes (Signed)
VASCULAR LAB    Left AVF ultrasound evaluation has been performed.  See CV proc for preliminary results.   Pegah Segel, RVT 10/06/2022, 12:35 PM

## 2022-10-06 NOTE — Consult Note (Signed)
Hospital Consult    Reason for Consult:  Thrombosed left arm AVF Referring Physician:  Nephrology MRN #:  [id number]  History of Present Illness: This is a 52 y.o. female admitted with RSV pneumonia, diabetes with severe gastroparesis as well as AKI on CKD 5 that vascular surgery has been called to evaluate a thrombosed left arm AV fistula.  Patient recently underwent left first stage brachiobasilic fistula with Dr. Donnetta Hutching on 10/01/2022.    Past Medical History:  Diagnosis Date   Anemia    Anxiety    Arthritis    Asthma    CHF (congestive heart failure) ([place])    Chronic kidney disease    Depression    Diabetic ulcer of left foot ([place]) 05/12/2013   Gastroparesis    Generalized abdominal pain    History of chicken pox    Loss of weight 12/02/2019   Migraines    Mixed hyperlipidemia 09/05/2022   Mood disorder ([place])    anxiety   Myocardial infarction ([place])    Prurigo nodularis    with diabetic dermopathy   Type 1 diabetes, uncontrolled, with neuropathy    [place]   Ulcers of both lower legs ([place]) 02/20/2014    Past Surgical History:  Procedure Laterality Date   BIOPSY  08/11/2020   Procedure: BIOPSY;  Surgeon: Ladene Artist, MD;  Location: [facility];  Service: Endoscopy;;   BIOPSY  04/02/2022   Procedure: BIOPSY;  Surgeon: Jackquline Denmark, MD;  Location: [facility];  Service: Gastroenterology;;   ESOPHAGOGASTRODUODENOSCOPY (EGD) WITH PROPOFOL N/A 08/11/2020   Procedure: ESOPHAGOGASTRODUODENOSCOPY (EGD) WITH PROPOFOL;  Surgeon: Ladene Artist, MD;  Location: [facility];  Service: Endoscopy;  Laterality: N/A;   ESOPHAGOGASTRODUODENOSCOPY (EGD) WITH PROPOFOL N/A 04/02/2022   Procedure: ESOPHAGOGASTRODUODENOSCOPY (EGD) WITH PROPOFOL;  Surgeon: Jackquline Denmark, MD;  Location: [facility];  Service: Gastroenterology;  Laterality: N/A;   ESOPHAGOGASTRODUODENOSCOPY (EGD) WITH PROPOFOL N/A 05/23/2022   Procedure: ESOPHAGOGASTRODUODENOSCOPY (EGD) WITH PROPOFOL;  Surgeon: Lucilla Lame, MD;  Location: [facility];  Service: Endoscopy;  Laterality: N/A;   ICD IMPLANT N/A 04/09/2022   Procedure: ICD IMPLANT;  Surgeon: Constance Haw, MD;  Location: [facility];  Service: Cardiovascular;  Laterality: N/A;   treadmill stress test  01/2013   WNL, low risk study    Allergies  Allergen Reactions   Atorvastatin Other (See Comments)    Dizziness    Dulaglutide Nausea And Vomiting and Other (See Comments)    Caused pancreatitis    Metoclopramide Other (See Comments)    Possible movement disorder or tardive dyskinesia while on reglan   Sertraline Nausea And Vomiting   Zofran [Ondansetron] Nausea And Vomiting and Other (See Comments)    Causes nausea vomiting to worsen / doesn't really work for patient.   Amoxicillin Itching and Rash   Lantus [Insulin Glargine] Itching and Rash    Has tolerated insulin glargine many times with no reported issues   Miconazole Nitrate Nausea And Vomiting and Rash    Prior to Admission medications   Medication Sig Start Date End Date Taking? Authorizing Provider  cloNIDine (CATAPRES - DOSED IN MG/24 HR) 0.1 mg/24hr patch 0.1 mg once a week. 09/27/22  Yes [provider]  acetaminophen (TYLENOL) 500 MG tablet as needed for moderate pain or mild pain.    [provider]  albuterol (PROAIR HFA) 108 (90 Base) MCG/ACT inhaler Inhale 2 puffs into the lungs every 6 (six) hours as needed for wheezing or shortness of breath. 07/03/20  [name], PA-C  Ensure Max Protein (ENSURE MAX PROTEIN) LIQD Take 330 mLs (11 oz total) by mouth daily. 04/30/22   Hongalgi, [name], MD  hydrALAZINE (APRESOLINE) 50 MG tablet Take 1 tablet (50 mg total) by mouth 3 (three) times daily. 07/17/22   Domenic Polite, MD  hydrOXYzine (ATARAX) 50 MG tablet Take 50 mg by mouth in the morning, at noon, and at bedtime.    [provider]  insulin aspart (NOVOLOG) 100 UNIT/ML injection Inject 150 Units into the skin See admin  instructions. Patient uses this with the [place]. Units vary according to blood sugar. Loads 150 units every 3 days.    [provider]  LORazepam (ATIVAN) 0.5 MG tablet Take 1 tablet (0.5 mg total) by mouth every 8 (eight) hours as needed for anxiety (Refractory nausea and vomiting). 07/17/22   Domenic Polite, MD  metoCLOPramide (REGLAN) 10 MG tablet Take 1 tablet (10 mg total) by mouth 3 (three) times daily before meals. 07/17/22   Domenic Polite, MD  metoprolol succinate (TOPROL-XL) 50 MG 24 hr tablet Take 1 tablet (50 mg total) by mouth daily. Take with or immediately following a meal. 09/05/22   Chandrasekhar, Mahesh A, MD  Multiple Vitamin (MULTIVITAMIN WITH MINERALS) TABS tablet Take 1 tablet by mouth daily. 04/30/22   Hongalgi, [name], MD  nitroGLYCERIN (NITROSTAT) 0.4 MG SL tablet Place 1 tablet (0.4 mg total) under the tongue every 5 (five) minutes as needed for chest pain. 09/05/22 12/04/22  Werner Lean, MD  oxyCODONE-acetaminophen (PERCOCET) 5-325 MG tablet Take 1 tablet by mouth every 6 (six) hours as needed for severe pain. 10/01/22   Rosetta Posner, MD  pantoprazole (PROTONIX) 40 MG tablet Take 1 tablet (40 mg total) by mouth 2 (two) times daily before a meal. 05/24/22 09/08/22  Rai, Ripudeep K, MD  polyethylene glycol (MIRALAX / GLYCOLAX) 17 g packet Take 17 g by mouth daily as needed for moderate constipation.    [provider]  pravastatin (PRAVACHOL) 40 MG tablet Take 1 tablet (40 mg total) by mouth every evening. 09/05/22   Chandrasekhar, Lyda Kalata A, MD  pregabalin (LYRICA) 50 MG capsule Take 50 mg by mouth 3 (three) times daily as needed (neuropathy). 12/05/21   [provider]  prochlorperazine (COMPAZINE) 10 MG tablet Take 1 tablet (10 mg total) by mouth every 6 (six) hours as needed for nausea or vomiting. 05/24/22   Rai, [name], MD  promethazine (PHENERGAN) 12.5 MG tablet Take 12.5 mg by mouth 2 (two) times daily as needed for nausea or vomiting.     [provider]  promethazine (PHENERGAN) 25 MG suppository Unwrap and place 1 suppository (25 mg total) rectally every 6 (six) hours as needed for nausea or vomiting. 07/17/22   Domenic Polite, MD  scopolamine (TRANSDERM-SCOP) 1 MG/3DAYS Place 1 patch (1.5 mg total) onto the skin every 3 (three) days. 05/26/22   Rai, Ripudeep K, MD  tobramycin (TOBREX) 0.3 % ophthalmic solution Place 1-2 drops into both eyes See admin instructions. Instill 1-2 drops into both eyes 3 times daily the day before, day of, and the day after every 6 weeks eye injections per patient 11/17/20   [provider]  torsemide (DEMADEX) 20 MG tablet TAKE THREE TABLETS BY MOUTH DAILY 08/30/22   Darrick Grinder D, NP    Social History   Socioeconomic History   Marital status: Divorced    Spouse name: Not on file   Number of children: 2  Years of education: Not on file   Highest education level: Not on file  Occupational History   Occupation: Freight forwarder  Tobacco Use   Smoking status: Never   Smokeless tobacco: Never  Vaping Use   Vaping Use: Never used  Substance and Sexual Activity   Alcohol use: Not Currently   Drug use: No   Sexual activity: Yes    Partners: Male    Birth control/protection: None  Other Topics Concern   Not on file  Social History Narrative   Caffeine use: daily   Occupation: cleans houses   Regular exercise: yes, active 5x/wk   Diet: good water, fruits/vegetables daily   Social Determinants of Health   Financial Resource Strain: Low Risk  (07/15/2022)   Overall Financial Resource Strain (CARDIA)    Difficulty of Paying Living Expenses: Not very hard  Food Insecurity: No Food Insecurity (10/04/2022)   Hunger Vital Sign    Worried About Running Out of Food in the Last Year: Never true    [place] in the Last Year: Never true  Transportation Needs: No Transportation Needs (10/04/2022)   PRAPARE - Hydrologist (Medical): No    Lack of  Transportation (Non-Medical): No  Physical Activity: Not on file  Stress: Not on file  Social Connections: Not on file  Intimate Partner Violence: Not At Risk (10/04/2022)   Humiliation, Afraid, Rape, and Kick questionnaire    Fear of Current or Ex-Partner: No    Emotionally Abused: No    Physically Abused: No    Sexually Abused: No     Family History  Problem Relation Age of Onset   Diabetes Mother        type 2   Hypertension Mother    Thyroid disease Mother    Bipolar disorder Mother    Heart disease Mother    Calcium disorder Mother    Cancer Maternal Grandmother        Breast, stomach   Cancer Paternal Grandmother        stomach, lung (smoker)   Diabetes Paternal Grandmother    Diabetes Paternal Grandfather    Heart failure Sister    Diabetes Sister    Stroke Maternal Aunt    Cancer Maternal Uncle        prostate   CAD Maternal Aunt        stents   Cancer Maternal Aunt 64       ovarian    ROS: '[x]'$  Positive   '[ ]'$  Negative   '[ ]'$  All sytems reviewed and are negative  Cardiovascular: '[]'$  chest pain/pressure '[]'$  palpitations '[]'$  SOB lying flat '[]'$  DOE '[]'$  pain in legs while walking '[]'$  pain in legs at rest '[]'$  pain in legs at night '[]'$  non-healing ulcers '[]'$  hx of DVT '[]'$  swelling in legs  Pulmonary: '[]'$  productive cough '[]'$  asthma/wheezing '[]'$  home O2 X SOB Neurologic: '[]'$  weakness in '[]'$  arms '[]'$  legs '[]'$  numbness in '[]'$  arms '[]'$  legs '[]'$  hx of CVA '[]'$  mini stroke '[]'$ difficulty speaking or slurred speech '[]'$  temporary loss of vision in one eye '[]'$  dizziness  Hematologic: '[]'$  hx of cancer '[]'$  bleeding problems '[]'$  problems with blood clotting easily  Endocrine:   '[]'$  diabetes '[]'$  thyroid disease  GI '[]'$  vomiting blood '[]'$  blood in stool  GU: '[]'$  CKD/renal failure '[]'$  HD--'[]'$  M/W/F or '[]'$  T/T/S '[]'$  burning with urination '[]'$  blood in urine  Psychiatric: '[]'$  anxiety '[]'$  depression  Musculoskeletal: '[]'$  arthritis '[]'$   joint pain  Integumentary: '[]'$  rashes '[]'$   ulcers  Constitutional: '[]'$  fever '[]'$  chills   Physical Examination  Vitals:   10/06/22 0835 10/06/22 1122  BP: 137/80 115/64  Pulse: 99 88  Resp: (!) 27 (!) 22  Temp: 99.3 F (37.4 C) 99.3 F (37.4 C)  SpO2: 96% 99%   Body mass index is 24.34 kg/m.  General:  NAD HENT: WNL, normocephalic Pulmonary: normal non-labored breathing Cardiac: regular, without  Murmurs, rubs or gallops Abdomen:  soft, NT/ND Vascular Exam/Pulses: Left basilic vein fistula with no thrill Left radial pulse palpable at the wrist 2 incisions in the left arm with no signs of infection Musculoskeletal: no muscle wasting or atrophy  Neurologic: A&O X 3; Appropriate Affect ; SENSATION: normal; MOTOR FUNCTION:  moving all extremities equally. Speech is fluent/normal   CBC    Component Value Date/Time   WBC 6.7 10/06/2022 0730   RBC 2.61 (L) 10/06/2022 0730   HGB 7.4 (L) 10/06/2022 0730   HGB 10.1 (L) 11/14/2021 1207   HCT 23.4 (L) 10/06/2022 0730   HCT 31.2 (L) 11/14/2021 1207   PLT 207 10/06/2022 0730   PLT 328 11/14/2021 1207   MCV 89.7 10/06/2022 0730   MCV 86 11/14/2021 1207   MCH 28.4 10/06/2022 0730   MCHC 31.6 10/06/2022 0730   RDW 15.7 (H) 10/06/2022 0730   RDW 14.0 11/14/2021 1207   LYMPHSABS 0.5 (L) 10/04/2022 1000   LYMPHSABS 1.3 04/23/2019 1554   MONOABS 0.1 10/04/2022 1000   EOSABS 0.0 10/04/2022 1000   EOSABS 0.0 04/23/2019 1554   BASOSABS 0.0 10/04/2022 1000   BASOSABS 0.0 04/23/2019 1554    BMET    Component Value Date/Time   NA 137 10/06/2022 0730   NA 139 11/14/2021 1207   K 4.3 10/06/2022 0730   CL 106 10/06/2022 0730   CO2 16 (L) 10/06/2022 0730   GLUCOSE 227 (H) 10/06/2022 0730   BUN 48 (H) 10/06/2022 0730   BUN 21 11/14/2021 1207   CREATININE 4.73 (H) 10/06/2022 0730   CALCIUM 8.0 (L) 10/06/2022 0730   GFRNONAA 11 (L) 10/06/2022 0730   GFRAA 102 01/31/2020 1008    COAGS: Lab Results  Component Value Date   INR 1.0 08/27/2022   INR 0.9 04/21/2022    INR 0.9 04/01/2022     Non-Invasive Vascular Imaging:    Left arm dialysis duplex confirms fistula is thrombosed   ASSESSMENT/PLAN: This is a 52 y.o. female admitted with RSV pneumonia, diabetes with severe gastroparesis as well as AKI on CKD 5 the vascular surgery has been called to evaluate a thrombosed left arm AV fistula.  Patient recently underwent left first stage brachiobasilic fistula with Dr. Donnetta Hutching on 10/01/2022.  Agree I cannot feel a thrill on exam.  Duplex confirms this is thrombosed.  Per Dr. Luther Parody clinic note she had small surface veins.  Discussed she would likely require AV graft in the left arm.  States with her respiratory status she cannot lay flat right now.  I think she will have to get better clinically even if that requires a temporary catheter to start dialysis in order to proceed to the OR.  She is getting aggressive lasix therapy at this time.  Marty Heck, MD [facility] Office: [place]

## 2022-10-06 NOTE — Consult Note (Addendum)
Cardiology Consultation   Patient ID: [name] MRN: [id number]; DOB: 09-05-1970  Admit date: 10/04/2022 Date of Consult: 10/06/2022  PCP:  [name], [facility] [facility] Cardiologist:  Werner Lean, MD  Electrophysiologist:  Melida Quitter, MD  {  Patient Profile:   [name] is a 52 y.o. 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 Feliberto Gottron.  History of Present Illness:   Ms. [name] is 52 year old female with the above history.  Patient was previously followed by Dr. Ed Blalock and now follows with Dr. Gasper Sells as well as Dr. Myles Gip.  Coronary CTA in 01/2020 showed according Neri calcium score of 0 and no evidence of CAD.  Echo in 08/2020 during admission for intractable nausea and vomiting likely secondary to gastroparesis showed LVEF of 40-45% with global hypokinesis.  However, EF later improved to 50-55% on repeat Echo in 03/2022.  Patient was admitted in 04/2022 for a witnessed cardiac arrest requiring CPR and AED shock.  Per EP notes at that time, it looks like this was a VF arrest and she underwent placement of a single lead Medtronic ICD.  Echo during that time showed LVEF of 50-55% with no regional wall motion abnormalities and grade 2 diastolic dysfunction.  Patient was recently seen by Dr. Gasper Sells on 09/05/2022 for at which time he reported some chest pressure at rest and she was started on Imdur.  She also mention at that time that there were plans for peritoneal versus hemodialysis for her CKD.  Dr. Gasper Sells also mentioned plan for MPI once she is on hemodialysis to confirm suspicion  of my coronary microvascular disease. He was then seen by Beryle Beams) [place], PA-C with EP, on 09/13/2022 at which time she was doing well from a cardiac standpoint with no ICD shocks. Her main complaint at that time was related to her chronic gastroparesis. She has had multiple ED visits and admissions over the last couple of months for intractable nausea and vomiting due to her gastroparesis and is followed by GI.  Patient presented to the ED on 10/04/2022 for abdominal pain and hematemesis as well as well as ICD shocks.  Upon arrival to the ED, patient markedly hypertensive with BP as high as the 190s over 110s and tachycardic. EKG showed sinus tachycardia, rate 110 bpm, with some underlying artifact/baseline wandering but T wave inversions noted in leads V4-V6. No significant changes compared to prior tracings in 08/2022. High-sensitivity troponin minimally elevated and flat at 24 >> 26.  BNP elevated at 1496.  D-dimer elevated at 6.7.  Chest x-ray showed new bilateral perihilar and bibasilar airspace opacities which could represent atypical infection or pulmonary edema. WBC 7.2 Hgb 9.4, Plts 288. Na 140, K 4.1, Glucose 288, BUN 37, Cr 3.61. Total Protein 7, Albumin 3.6, AST 9, ALT 14, Alk Phos 83, Total Bili 1.0.  Lactic acid normal.  Lipase <10. Respiratory panel was positive for RSV.  She was admitted for RSV pneumonia and started on antibiotics.  Echo was ordered and showed LVEF of 40-45% with global hypokinesis, mild LVH, and grade 3 diastolic dysfunction. Also showed moderately enlarged RV with  mildly reduced RV function, moderate biatrial enlargement, and moderately elevated PASP. Given drop in EF and mildly elevated troponin, Cardiology was consulted for further evaluation.   Patient states that with her gastroparesis that brought her back into the ED again.  She continues to have abdominal pain and intractable nausea and vomiting which she has had for the last 2 years. She also reports hematemesis  that looked like "coffee grounds."  She has been seen by GI at [facility] and she states GI does not feel comfortable treating her gastro paresis until she has started dialysis.  She had an AV fistula placed last week on 10/01/2022 and per Nephrology they may start dialysis tomorrow if kidney function shows no improvement.  She also reports shortness of breath and a productive cough for the past few days.  She has had chronic orthopnea for the last couple years and this is stable but does report intermittent PND which she thinks has been occurring more frequently lately.  No significant lower extremity edema.  No weight gain.  She also reports some substernal chest pressure that occurs at rest.  She denies any exertional chest pain but does admit that she is not very active.  She never tried to the Imdur that Dr. Gasper Sells.  I am wondering this is all due to her intractable nausea/vomiting.  She reports some palpitations when she is more short of breath or vomiting as well as some dizziness with position changes.  However, no syncope.  She has been febrile here in the hospital with Tmax of 101.8 in 10/05/2022 but denies any fevers at home.  However, she does report body aches and chills.  No recent sick contacts.  She denies any other abnormal bleeding besides the hematemesis.  There is mention of concern for ICD shock and some of the ED notes.  Patient denies any clear ICD shocks with me but states she has felt her ICD vibrate and deep since she had her AV fistula placed on 10/01/2022. Last device check on 09/13/2022 showed normal device interrogation and battery was within normal limits.  Past Medical History:  Diagnosis Date   Anemia    Anxiety    Arthritis    Asthma    CHF (congestive heart failure) ([place])    Chronic kidney disease    Depression    Diabetic ulcer of left foot ([place]) 05/12/2013   Gastroparesis    Generalized abdominal pain    History of chicken pox    Loss of weight  12/02/2019   Migraines    Mixed hyperlipidemia 09/05/2022   Mood disorder ([place])    anxiety   Myocardial infarction ([place])    Prurigo nodularis    with diabetic dermopathy   Type 1 diabetes, uncontrolled, with neuropathy    [place]   Ulcers of both lower legs ([place]) 02/20/2014    Past Surgical History:  Procedure Laterality Date   BIOPSY  08/11/2020   Procedure: BIOPSY;  Surgeon: Ladene Artist, MD;  Location: [facility];  Service: Endoscopy;;   BIOPSY  04/02/2022   Procedure: BIOPSY;  Surgeon: Jackquline Denmark, MD;  Location: [facility];  Service: Gastroenterology;;   ESOPHAGOGASTRODUODENOSCOPY (EGD) WITH PROPOFOL N/A 08/11/2020   Procedure: ESOPHAGOGASTRODUODENOSCOPY (EGD) WITH PROPOFOL;  Surgeon: Ladene Artist, MD;  Location: [facility];  Service: Endoscopy;  Laterality: N/A;   ESOPHAGOGASTRODUODENOSCOPY (EGD) WITH PROPOFOL N/A 04/02/2022   Procedure: ESOPHAGOGASTRODUODENOSCOPY (EGD) WITH PROPOFOL;  Surgeon: Jackquline Denmark, MD;  Location: [facility];  Service: Gastroenterology;  Laterality: N/A;   ESOPHAGOGASTRODUODENOSCOPY (EGD) WITH PROPOFOL N/A 05/23/2022   Procedure: ESOPHAGOGASTRODUODENOSCOPY (EGD) WITH PROPOFOL;  Surgeon: Lucilla Lame, MD;  Location: [facility]COPY;  Service: Endoscopy;  Laterality: N/A;   ICD IMPLANT N/A 04/09/2022   Procedure: ICD IMPLANT;  Surgeon: Constance Haw, MD;  Location: [facility];  Service: Cardiovascular;  Laterality: N/A;   treadmill stress test  01/2013   WNL, low risk study     Home Medications:  Prior to Admission medications   Medication Sig Start Date End Date Taking? Authorizing Provider  cloNIDine (CATAPRES - DOSED IN MG/24 HR) 0.1 mg/24hr patch 0.1 mg once a week. 09/27/22  Yes [provider]  acetaminophen (TYLENOL) 500 MG tablet as needed for moderate pain or mild pain.    [provider]  albuterol (PROAIR HFA) 108 (90 Base) MCG/ACT inhaler Inhale 2 puffs into the lungs every 6 (six) hours as needed  for wheezing or shortness of breath. 07/03/20   [name], PA-C  Ensure Max Protein (ENSURE MAX PROTEIN) LIQD Take 330 mLs (11 oz total) by mouth daily. 04/30/22   Hongalgi, [name], MD  hydrALAZINE (APRESOLINE) 50 MG tablet Take 1 tablet (50 mg total) by mouth 3 (three) times daily. 07/17/22   Domenic Polite, MD  hydrOXYzine (ATARAX) 50 MG tablet Take 50 mg by mouth in the morning, at noon, and at bedtime.    [provider]  insulin aspart (NOVOLOG) 100 UNIT/ML injection Inject 150 Units into the skin See admin instructions. Patient uses this with the [place]. Units vary according to blood sugar. Loads 150 units every 3 days.    [provider]  LORazepam (ATIVAN) 0.5 MG tablet Take 1 tablet (0.5 mg total) by mouth every 8 (eight) hours as needed for anxiety (Refractory nausea and vomiting). 07/17/22   Domenic Polite, MD  metoCLOPramide (REGLAN) 10 MG tablet Take 1 tablet (10 mg total) by mouth 3 (three) times daily before meals. 07/17/22   Domenic Polite, MD  metoprolol succinate (TOPROL-XL) 50 MG 24 hr tablet Take 1 tablet (50 mg total) by mouth daily. Take with or immediately following a meal. 09/05/22   Chandrasekhar, Mahesh A, MD  Multiple Vitamin (MULTIVITAMIN WITH MINERALS) TABS tablet Take 1 tablet by mouth daily. 04/30/22   Hongalgi, [name], MD  nitroGLYCERIN (NITROSTAT) 0.4 MG SL tablet Place 1 tablet (0.4 mg total) under the tongue every 5 (five) minutes as needed for chest pain. 09/05/22 12/04/22  Werner Lean, MD  oxyCODONE-acetaminophen (PERCOCET) 5-325 MG tablet Take 1 tablet by mouth every 6 (six) hours as needed for severe pain. 10/01/22   Rosetta Posner, MD  pantoprazole (PROTONIX) 40 MG tablet Take 1 tablet (40 mg total) by mouth 2 (two) times daily before a meal. 05/24/22 09/08/22  Rai, Ripudeep K, MD  polyethylene glycol (MIRALAX / GLYCOLAX) 17 g packet Take 17 g by mouth daily as needed for moderate constipation.    [provider]   pravastatin (PRAVACHOL) 40 MG tablet Take 1 tablet (40 mg total) by mouth every evening. 09/05/22   Chandrasekhar, Lyda Kalata A, MD  pregabalin (LYRICA) 50 MG capsule Take 50 mg by mouth 3 (three) times daily as needed (neuropathy). 12/05/21   [provider]  prochlorperazine (COMPAZINE) 10 MG tablet Take 1 tablet (10 mg total) by mouth every 6 (six) hours as needed for nausea or vomiting. 05/24/22   Rai, [name], MD  promethazine (PHENERGAN) 12.5 MG tablet Take 12.5 mg by mouth  2 (two) times daily as needed for nausea or vomiting.    [provider]  promethazine (PHENERGAN) 25 MG suppository Unwrap and place 1 suppository (25 mg total) rectally every 6 (six) hours as needed for nausea or vomiting. 07/17/22   Domenic Polite, MD  scopolamine (TRANSDERM-SCOP) 1 MG/3DAYS Place 1 patch (1.5 mg total) onto the skin every 3 (three) days. 05/26/22   Rai, Ripudeep K, MD  tobramycin (TOBREX) 0.3 % ophthalmic solution Place 1-2 drops into both eyes See admin instructions. Instill 1-2 drops into both eyes 3 times daily the day before, day of, and the day after every 6 weeks eye injections per patient 11/17/20   [provider]  torsemide (DEMADEX) 20 MG tablet TAKE THREE TABLETS BY MOUTH DAILY 08/30/22   Darrick Grinder D, NP    Inpatient Medications: Scheduled Meds:  aspirin EC  81 mg Oral Daily   furosemide  40 mg Intravenous BID   guaiFENesin  10 mL Oral Q6H   heparin injection (subcutaneous)  5,000 Units Subcutaneous Q12H   hydrALAZINE  50 mg Oral Q8H   insulin aspart  0-5 Units Subcutaneous QHS   insulin aspart  0-9 Units Subcutaneous TID WC   insulin glargine-yfgn  10 Units Subcutaneous QHS   isosorbide dinitrate  10 mg Oral TID   levalbuterol  0.63 mg Nebulization Q8H   metoCLOPramide (REGLAN) injection  5 mg Intravenous Q6H   metoprolol succinate  50 mg Oral Daily   pantoprazole (PROTONIX) IV  40 mg Intravenous Daily   pravastatin  40 mg Oral q1800   scopolamine  1 patch  Transdermal Q72H   Continuous Infusions:  cefTRIAXone (ROCEPHIN)  IV 2 g (10/06/22 0551)   doxycycline (VIBRAMYCIN) IV 100 mg (10/06/22 0630)   PRN Meds: acetaminophen, LORazepam, prochlorperazine  Allergies:    Allergies  Allergen Reactions   Atorvastatin Other (See Comments)    Dizziness    Dulaglutide Nausea And Vomiting and Other (See Comments)    Caused pancreatitis    Metoclopramide Other (See Comments)    Possible movement disorder or tardive dyskinesia while on reglan   Sertraline Nausea And Vomiting   Zofran [Ondansetron] Nausea And Vomiting and Other (See Comments)    Causes nausea vomiting to worsen / doesn't really work for patient.   Amoxicillin Itching and Rash   Lantus [Insulin Glargine] Itching and Rash    Has tolerated insulin glargine many times with no reported issues   Miconazole Nitrate Nausea And Vomiting and Rash    Social History:   Social History   Socioeconomic History   Marital status: Divorced    Spouse name: Not on file   Number of children: 2   Years of education: Not on file   Highest education level: Not on file  Occupational History   Occupation: Freight forwarder  Tobacco Use   Smoking status: Never   Smokeless tobacco: Never  Vaping Use   Vaping Use: Never used  Substance and Sexual Activity   Alcohol use: Not Currently   Drug use: No   Sexual activity: Yes    Partners: Male    Birth control/protection: None  Other Topics Concern   Not on file  Social History Narrative   Caffeine use: daily   Occupation: cleans houses   Regular exercise: yes, active 5x/wk   Diet: good water, fruits/vegetables daily   Social Determinants of Health   Financial Resource Strain: Low Risk  (07/15/2022)   Overall Financial Resource Strain (CARDIA)    Difficulty  of Paying Living Expenses: Not very hard  Food Insecurity: No Food Insecurity (10/04/2022)   Hunger Vital Sign    Worried About Running Out of Food in the Last Year: Never true    Ran Out of  Food in the Last Year: Never true  Transportation Needs: No Transportation Needs (10/04/2022)   PRAPARE - Hydrologist (Medical): No    Lack of Transportation (Non-Medical): No  Physical Activity: Not on file  Stress: Not on file  Social Connections: Not on file  Intimate Partner Violence: Not At Risk (10/04/2022)   Humiliation, Afraid, Rape, and Kick questionnaire    Fear of Current or Ex-Partner: No    Emotionally Abused: No    Physically Abused: No    Sexually Abused: No    Family History:    Family History  Problem Relation Age of Onset   Diabetes Mother        type 2   Hypertension Mother    Thyroid disease Mother    Bipolar disorder Mother    Heart disease Mother    Calcium disorder Mother    Cancer Maternal Grandmother        Breast, stomach   Cancer Paternal Grandmother        stomach, lung (smoker)   Diabetes Paternal Grandmother    Diabetes Paternal Grandfather    Heart failure Sister    Diabetes Sister    Stroke Maternal Aunt    Cancer Maternal Uncle        prostate   CAD Maternal Aunt        stents   Cancer Maternal Aunt 64       ovarian     ROS:  Please see the history of present illness.  Review of Systems  Constitutional:  Positive for chills and fever.  HENT:  Negative for congestion.   Respiratory:  Positive for cough, sputum production and shortness of breath. Negative for hemoptysis.   Cardiovascular:  Positive for chest pain, palpitations, orthopnea and PND. Negative for leg swelling.  Gastrointestinal:  Positive for abdominal pain, nausea and vomiting. Negative for blood in stool and melena.  Genitourinary:  Negative for hematuria.  Musculoskeletal:  Positive for myalgias.  Neurological:  Positive for dizziness. Negative for loss of consciousness.  Endo/Heme/Allergies:  Does not bruise/bleed easily.  Psychiatric/Behavioral:  Negative for substance abuse.     All other ROS reviewed and negative.     Physical  Exam/Data:   Vitals:   10/06/22 0500 10/06/22 0608 10/06/22 0710 10/06/22 0835  BP: (!) 143/84 (!) 156/101  137/80  Pulse: 99   99  Resp: (!) 22   (!) 27  Temp: 98.4 F (36.9 C)   99.3 F (37.4 C)  TempSrc:    Oral  SpO2: 97%  99% 96%  Weight:      Height:        Intake/Output Summary (Last 24 hours) at 10/06/2022 0927 Last data filed at 10/06/2022 0500 Gross per 24 hour  Intake --  Output 800 ml  Net -800 ml      10/04/2022    5:36 PM 10/01/2022   10:01 AM 09/13/2022    9:25 AM  Last 3 Weights  Weight (lbs) 179 lb 7.3 oz 177 lb 0.5 oz 177 lb  Weight (kg) 81.4 kg 80.3 kg 80.287 kg     Body mass index is 24.34 kg/m.   General: 52 y.o. female resting comfortably in no acute distress. HEENT: Normocephalic  and atraumatic. Sclera clear.  Neck: Supple. No carotid bruits. No JVD. Heart: RRR. Distinct S1 and S2. No murmurs, gallops, or rubs. Radial and distal pedal pulses 2+ and equal bilaterally. Lungs: No increased work of breathing. Clear to ausculation bilaterally. No wheezes, rhonchi, or rales.  Abdomen: Soft, non-distended, and non-tender to palpation.  Extremities: No lower extremity edema.    Skin: Warm and dry. Neuro: Alert and oriented x3. No focal deficits. Psych: Normal affect. Responds appropriately.   EKG:  The EKG was personally reviewed and demonstrates:  EKG showed sinus tachycardia, rate 110 bpm, with some underlying artifact/baseline wandering bu T wave inversions noted in leads V4-V6.  Telemetry:  Telemetry was personally reviewed and demonstrates:  Normal sinus rhythm with rates in the 80s to 90s.   Relevant CV Studies:  Coronary CTA 01/19/2020: Impressions: 1. Coronary calcium score of 0. 2. Normal coronary origin with right dominance. 3. Normal coronary arteries. 4. Severe biatrial enlargement. _______________  Echocardiogram 10/05/2022: Impressions:  1. Left ventricular ejection fraction, by estimation, is 40 to 45%. The  left ventricle has mildly  decreased function. The left ventricle  demonstrates global hypokinesis. There is mild concentric left ventricular  hypertrophy. Left ventricular diastolic  parameters are consistent with Grade III diastolic dysfunction  (restrictive). Elevated left atrial pressure.   2. Right ventricular systolic function is mildly reduced. The right  ventricular size is moderately enlarged. There is moderately elevated  pulmonary artery systolic pressure.   3. Left atrial size was moderately dilated.   4. Right atrial size was moderately dilated.   5. The mitral valve is normal in structure. No evidence of mitral valve  regurgitation. No evidence of mitral stenosis.   6. Tricuspid valve regurgitation is moderate to severe.   7. The aortic valve is tricuspid. Aortic valve regurgitation is not  visualized. No aortic stenosis is present.   8. The inferior vena cava is dilated in size with <50% respiratory  variability, suggesting right atrial pressure of 15 mmHg.   Comparison(s): Prior images reviewed side by side. The left ventricular  function is significantly worse. The right ventricular systolic function  is worse. TR severity and PA hypertension are also worse, with evidence of  hypervolemia.    Laboratory Data:  High Sensitivity Troponin:   Recent Labs  Lab 10/04/22 1000 10/04/22 1126  TROPONINIHS 24* 26*     Chemistry Recent Labs  Lab 10/04/22 1000 10/05/22 0242 10/06/22 0730  NA 140 140 137  K 4.1 3.8 4.3  CL 107 109 106  CO2 23 22 16*  GLUCOSE 288* 184* 227*  BUN 37* 39* 48*  CREATININE 3.61* 4.12* 4.73*  CALCIUM 8.9 8.0* 8.0*  MG  --  1.8 1.7  GFRNONAA 15* 12* 11*  ANIONGAP '10 9 15    '$ Recent Labs  Lab 10/04/22 1000 10/05/22 0242 10/06/22 0730  PROT 7.0 5.1* 5.2*  ALBUMIN 3.6 2.1* 2.2*  AST 9* 14* 42*  ALT 14 11 36  ALKPHOS 83 63 136*  BILITOT 1.0 0.9 0.7   Lipids No results for input(s): "CHOL", "TRIG", "HDL", "LABVLDL", "LDLCALC", "CHOLHDL" in the last 168  hours.  Hematology Recent Labs  Lab 10/04/22 1000 10/05/22 0242 10/06/22 0730  WBC 7.2 7.1 6.7  RBC 3.29* 2.61* 2.61*  HGB 9.4* 7.7* 7.4*  HCT 28.6* 23.1* 23.4*  MCV 86.9 88.5 89.7  MCH 28.6 29.5 28.4  MCHC 32.9 33.3 31.6  RDW 15.4 15.6* 15.7*  PLT 288 236 207   Thyroid No results  for input(s): "TSH", "FREET4" in the last 168 hours.  BNP Recent Labs  Lab 10/04/22 1000  BNP 1,496.7*    DDimer  Recent Labs  Lab 10/04/22 1000  DDIMER 6.70*     Radiology/Studies:  ECHOCARDIOGRAM COMPLETE  Result Date: 10/05/2022    ECHOCARDIOGRAM REPORT   Patient Name:   DALAINA NOYER Date of Exam: 10/05/2022 Medical Rec #:  [id number]      Height:       72.0 in Accession #:    [id number]     Weight:       179.5 lb Date of Birth:  [birth year]     BSA:          2.035 m Patient Age:    72 years       BP:           120/72 mmHg Patient Gender: F              HR:           94 bpm. Exam Location:  Inpatient Procedure: 2D Echo, Cardiac Doppler and Color Doppler Indications:    Chest Pain R07.9  History:        Patient has no prior history of Echocardiogram examinations.                 CHF, CAD and Previous Myocardial Infarction; Risk                 Factors:Diabetes and Hypertension.  Sonographer:    Luane School RDCS Referring Phys: [id number] [place]  1. Left ventricular ejection fraction, by estimation, is 40 to 45%. The left ventricle has mildly decreased function. The left ventricle demonstrates global hypokinesis. There is mild concentric left ventricular hypertrophy. Left ventricular diastolic parameters are consistent with Grade III diastolic dysfunction (restrictive). Elevated left atrial pressure.  2. Right ventricular systolic function is mildly reduced. The right ventricular size is moderately enlarged. There is moderately elevated pulmonary artery systolic pressure.  3. Left atrial size was moderately dilated.  4. Right atrial size was moderately dilated.  5. The mitral valve is normal  in structure. No evidence of mitral valve regurgitation. No evidence of mitral stenosis.  6. Tricuspid valve regurgitation is moderate to severe.  7. The aortic valve is tricuspid. Aortic valve regurgitation is not visualized. No aortic stenosis is present.  8. The inferior vena cava is dilated in size with <50% respiratory variability, suggesting right atrial pressure of 15 mmHg. Comparison(s): Prior images reviewed side by side. The left ventricular function is significantly worse. The right ventricular systolic function is worse. TR severity and PA hypertension are also worse, with evidence of hypervolemia. FINDINGS  Left Ventricle: Left ventricular ejection fraction, by estimation, is 40 to 45%. The left ventricle has mildly decreased function. The left ventricle demonstrates global hypokinesis. The left ventricular internal cavity size was normal in size. There is  mild concentric left ventricular hypertrophy. Left ventricular diastolic parameters are consistent with Grade III diastolic dysfunction (restrictive). Elevated left atrial pressure. Right Ventricle: The right ventricular size is moderately enlarged. No increase in right ventricular wall thickness. Right ventricular systolic function is mildly reduced. There is moderately elevated pulmonary artery systolic pressure. The tricuspid regurgitant velocity is 3.05 m/s, and with an assumed right atrial pressure of 15 mmHg, the estimated right ventricular systolic pressure is [id number]. Left Atrium: Left atrial size was moderately dilated. Right Atrium: Right atrial size was moderately dilated. Pericardium: Trivial pericardial effusion is present. Mitral  Valve: The mitral valve is normal in structure. There is mild thickening of the mitral valve leaflet(s). Mild mitral annular calcification. No evidence of mitral valve regurgitation. No evidence of mitral valve stenosis. Tricuspid Valve: The tricuspid valve is normal in structure. Tricuspid valve  regurgitation is moderate to severe. Aortic Valve: The aortic valve is tricuspid. Aortic valve regurgitation is not visualized. No aortic stenosis is present. Pulmonic Valve: The pulmonic valve was normal in structure. Pulmonic valve regurgitation is trivial. Aorta: The aortic root and ascending aorta are structurally normal, with no evidence of dilitation. Venous: The inferior vena cava is dilated in size with less than 50% respiratory variability, suggesting right atrial pressure of 15 mmHg. IAS/Shunts: No atrial level shunt detected by color flow Doppler. Additional Comments: A device lead is visualized in the right ventricle. There is pleural effusion in the left lateral region.  LEFT VENTRICLE PLAX 2D LVIDd:         4.70 cm     Diastology LVIDs:         3.80 cm     LV e' medial:    6.53 cm/s LV PW:         1.30 cm     LV E/e' medial:  16.1 LV IVS:        1.14 cm     LV e' lateral:   8.05 cm/s LVOT diam:     2.00 cm     LV E/e' lateral: 13.0 LV SV:         43 LV SV Index:   21 LVOT Area:     3.14 cm  LV Volumes (MOD) LV vol d, MOD A2C: 85.9 ml LV vol d, MOD A4C: 78.0 ml LV vol s, MOD A2C: 54.3 ml LV vol s, MOD A4C: 53.8 ml LV SV MOD A2C:     31.6 ml LV SV MOD A4C:     78.0 ml LV SV MOD BP:      31.4 ml RIGHT VENTRICLE            IVC RV S prime:     9.90 cm/s  IVC diam: 2.40 cm TAPSE (M-mode): 1.7 cm LEFT ATRIUM           Index        RIGHT ATRIUM           Index LA diam:      4.67 cm 2.30 cm/m   RA Area:     19.40 cm LA Vol (A2C): 82.5 ml 40.55 ml/m  RA Volume:   58.20 ml  28.60 ml/m LA Vol (A4C): 66.5 ml 32.68 ml/m  AORTIC VALVE LVOT Vmax:   77.50 cm/s LVOT Vmean:  58.000 cm/s LVOT VTI:    0.138 m  AORTA Ao Root diam: 3.40 cm Ao Asc diam:  3.30 cm Ao Desc diam: 2.40 cm MITRAL VALVE                TRICUSPID VALVE MV Area (PHT): 6.96 cm     TR Peak grad:   37.2 mmHg MV Decel Time: 109 msec     TR Vmax:        305.00 cm/s MV E velocity: 105.00 cm/s MV A velocity: 41.60 cm/s   SHUNTS MV E/A ratio:  2.52          Systemic VTI:  0.14 m  Systemic Diam: 2.00 cm Sanda Klein MD Electronically signed by Sanda Klein MD Signature Date/Time: 10/05/2022/2:53:24 PM    Final    CT ABDOMEN PELVIS WO CONTRAST  Result Date: 10/05/2022 CLINICAL DATA:  Abdominal pain, vomiting EXAM: CT ABDOMEN AND PELVIS WITHOUT CONTRAST TECHNIQUE: Multidetector CT imaging of the abdomen and pelvis was performed following the standard protocol without IV contrast. RADIATION DOSE REDUCTION: This exam was performed according to the departmental dose-optimization program which includes automated exposure control, adjustment of the mA and/or kV according to patient size and/or use of iterative reconstruction technique. COMPARISON:  05/23/2022 FINDINGS: Lower chest: Moderate bilateral pleural effusions with associated compressive atelectasis in the bilateral lower lobes. Additional patchy anterior right lower lobe opacity, possibly reflecting pneumonia, less likely atelectasis. Mild lingular and left lower lobe atelectasis. Hepatobiliary: Unenhanced liver is unremarkable. Gallbladder is unremarkable. No intrahepatic or extrahepatic duct dilatation. Pancreas: Within normal limits. Spleen: Within normal limits. Adrenals/Urinary Tract: Adrenal glands are within normal limits. Kidneys are within normal limits. No renal, ureteral, or bladder calculi. No hydronephrosis. Mildly thick-walled bladder. Stomach/Bowel: Stomach is within normal limits. No evidence of bowel obstruction. Normal appendix (series 3/image 66). No colonic wall thickening or inflammatory changes. Vascular/Lymphatic: No evidence of abdominal aortic aneurysm. No suspicious abdominopelvic lymphadenopathy. Reproductive: Uterus is within normal limits. Bilateral ovaries are within normal limits. Other: Trace pelvic ascites. Musculoskeletal: Visualized osseous structures are within normal limits. IMPRESSION: No evidence of bowel obstruction. Normal appendix. No CT  findings to account for the patient's abdominal pain. Mildly thick-walled bladder, correlate for cystitis. Moderate bilateral pleural effusions with associated compressive atelectasis in the bilateral lower lobes. Additional patchy anterior right lower lobe opacity, possibly reflecting pneumonia, less likely atelectasis. Electronically Signed   By: Julian Hy M.D.   On: 10/05/2022 01:13   DG Chest 2 View  Result Date: 10/04/2022 CLINICAL DATA:  Shortness of breath EXAM: CHEST - 2 VIEW COMPARISON:  CXR 09/01/22 FINDINGS: Left-sided single lead cardiac device in place with likely unchanged lead positioning when accounting for differences in positioning. No pleural effusion. No pneumothorax. Unchanged slightly enlarged cardiac contours. Unchanged mediastinal contours. Compared to prior exam there are new bilateral perihilar and bibasilar airspace opacities, which could represent atypical infection or pulmonary edema. No radiographically apparent displaced rib fractures. Vertebral body heights are maintained. IMPRESSION: Compared to prior exam there are new bilateral perihilar and bibasilar airspace opacities, which could represent atypical infection or pulmonary edema. Electronically Signed   By: Marin Roberts M.D.   On: 10/04/2022 10:20     Assessment and Plan:   Chest Pain Elevated Troponin Coronary CTA in 01/2020 showed coronary calcium score of 0 with no evidence of CAD. She did report some chest pain that she described as a pressure at office visit last month. She was started on Imdur and there was mention of PET MPI in the future once patient had been started on dialysis. Patient has a long history of gastroparesis with intractable nausea and vomiting and presented back to the ED with this. Also reported shortness of breath and cough and found to have RSV pneumonia. She continues to report some chest pressure at rest and states she never started the Imdur. EKG showed sinus tachycardia with some T  wave inversion in leads V4-V6 but no acute changes compared to prior tracing in 08/2022. High-sensitivity troponin minimally elevated and flat at 24 >>26 not consistent with ACS. Echo showed LVEF of 40-45% with global hypokinesis. EF slightly down from 50-55% in 04/2022 but has  been in this range before.  Wonder if chest pressure is due to uncontrolled hypertension. BP as high as the 190s/110s on admission. May also be due to chronic nausea/vomiting. I do not think this is angina at this time. Started on Isordil '10mg'$  three times daily this admission and tolerating well so far. Continue. Can consider PET MPI in the future as recommended by primary Cardiologist but no inpatient ischemic work-up necessary.  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 Echo at that time showed normal LV function with no regional wall motion abnormalities. No repeat ischemic evaluation was done at that time given normal coronaries on coronary CTA in 01/2020. Cardiac MRI was not preformed either - suspect this is due to CKD stage IV-V. Patient denies any ICD shocks but does states her device has been vibrating and beeping since 10/01/2022 after she had AV fistula placed for dialysis. Last remote device check in 09/2022 showed normal device function and normal battery. Will ask [employer] Rep to interrogate device tomorrow given it is the weekend. Can also consider outpatient cardiac MRI in the future to assess for potential causes of VT arrest including any infiltrative cardiomyopathies.  Chronic HFrEF with Mildly Reduced EF EF previously 40-45% in 2021 but later improved to 50-55%. She presents with shortness of breath and cough over the last 5 days but was diagnosed with RSV pneumonia. BNP elevated at 1,496. Chest x-ray showed new bilateral perihilar and bibasilar opacities which could represent atypical infection or edema. Echo this admission shows LVEF of 40-45% with global hypokinesis, mild LVH, and grade 3 diastolic  dysfunction. Also showed moderately enlarged RV with mildly reduced RV function, moderate biatrial enlargement, and moderately elevated PASP. Drop in EF may be due to uncontrolled hypertension. She does not look volume overloaded on exam. Would defer management of volume status due to renal function and plans for possible dialysis. Continue Hydralazine '50mg'$  three times daily, Isordil '10mg'$  three times daily, and Toprol-XL '50mg'$  daily. No ACEI/ARB/ARNI, MRA< or SGLT2 inhibitor due to renal function.   Hypertension BP was initially in the 190s/110s on admission. Now well controlled. Continue current medications: Hydralazine '50mg'$  three times daily, Isordil '10mg'$  three times daily, and Toprol-XL '50mg'$  daily.  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 Her creatinine level has been fluctuating between 3.0 to 4.0 with multiple recent hospitalization. Creatinine 4.73 today. Nephrology following. She had AV fistula placed earlier this week. Per Nephrology, may start dialysis as early as tomorrow if kidney function does not improves.  Otherwise, per primary team: - RSV pneumonia - Gastroparesis with intractable nausea and vomiting - Type 1 diabetes mellitus    Risk Assessment/Risk Scores:    New York Heart Association (NYHA) Functional Class NYHA Class III  For questions or updates, please contact [facility] Please consult [email or web address] for contact info under    Signed, Eppie Gibson  10/06/2022 9:27 AM  Patient seen and examined with Sande Rives PA-C.  Agree as above, with the following exceptions and changes as noted below.  Ms. [name] is a very pleasant 52 year old female currently admitted with RSV pneumonia, chronic renal failure CKD stage IV-V anticipating dialysis initiation, chronic gastroparesis with intractable nausea and vomiting and type 1 diabetes.  Cardiology  consulted for mildly reduced ejection fraction in the setting of acute infection and renal failure.  She has a history of mildly reduced ejection fraction with coronary CTA documenting no  coronary artery disease in 2021.  She also curiously has a history of VF arrest but unfortunately did not undergo cardiac MRI likely secondary to concerns about renal function and gadolinium contrast.  She does now have an ICD.  Gen: NAD, CV: RRR, no murmurs, Lungs: Coarse bilaterally, Abd: soft, Extrem: Warm, well perfused, no edema, Neuro/Psych: alert and oriented x 3, normal mood and affect. All available labs, radiology testing, previous records reviewed.  Patient is currently noted to have a thrombosed AV fistula and being seen by vascular surgery.  May need alternate access for dialysis if pursued tomorrow with no improvement in renal function.  Renal dysfunction of course limits our ability to further evaluate her mildly reduced ejection fraction.  Her blood pressure was elevated on admission, suspect some of her LV dysfunction is related to acute infection and likely elevated blood pressures.  Unlikely to be ischemic in origin with reassuring coronary CT a few years ago. Would continue heart failure therapy as noted above, notably limited by renal function.  Focus should be on controlling blood pressure as this may optimize LV function.  It would be reasonable to consider a cardiac MRI in the future to better assess the nature of her nonischemic cardiomyopathy.  Will have nephrology weigh in formally for gadolinium contrast, however current hospital policy suggests this should be permissible regardless of renal function given cyclic gadolinium agents.  Our current cardiac MRI scanner may not take diagnostic images with ICD in place, however this can be considered at the discretion of her primary cardiologist after scanner upgrades are performed later this year.    Elouise Munroe, MD 10/06/22 4:29 PM

## 2022-10-06 NOTE — Progress Notes (Signed)
  Progress Note   Patient: [name] [id number] DOB: 1970-09-02 DOA: 10/04/2022     2 DOS: the patient was seen and examined on 10/06/2022   Brief hospital course:  Assessment and Plan: RSV pneumonia  - Supportive care  - Oxygen to maintain O2 sat >94% - IV doxycycline 100 mg q12 - IV ceftriaxone 2 g daily  - Xopenex q8hr   - Robitussin 10 mL PO q6 hr    DM2 with gastroparesis  - A1c pending  - Novolog SS ACHS  - Glargine 10 units sq daily  - Pravastatin 40 mg PO daily    AKI on CKD 5 - Complicating care  - Nephrology consulted 10/06/2022 (Dr. Carolin Sicks will see the pt)   Elevated troponin  - Likely demand from the pt's elevated BP and RSV infection  - ECHO LV 40-45% & grade III diastolic dysfunction (both L and R sys function are worse when compared to previous ECHO) - Cardiology consulted 10/06/2022 (Dr. Margaretann Loveless advised pt will be seen on Mon 10/07/2022)   HTN urgency  - Hydralazine 50 mg PO q8hr  - Isordil 10 mg PO tid . - Toprol XL 50 mg PO daily    6. Nausea and vomiting  - CT abd/pelvis without contrast appreciated  - IV ativan 0.5 mg q6 hr PRN  - IV reglan 5 mg q6 hr  - IV Compazine 5 mg q6 hr PRN  - Scopolamine patch 1.5 mg q72 hr    DVT prophylaxis: Heparin 5000 units sq q12  GI prophylaxis: IV protonix 40 mg daily       Subjective: Pt seen and examined at the bedside. She is more awake today but still coughing. Fevers appear to have improved. Continue supportive care.   Cardiology and nephrology consulted on 10/06/2022.  Physical Exam: Vitals:   10/06/22 0500 10/06/22 0608 10/06/22 0710 10/06/22 0835  BP: (!) 143/84 (!) 156/101  137/80  Pulse: 99   99  Resp: (!) 22   (!) 27  Temp: 98.4 F (36.9 C)   99.3 F (37.4 C)  TempSrc:    Oral  SpO2: 97%  99% 96%  Weight:      Height:       Constitutional:      Appearance: Awake and alert  HENT:     Head: Atraumatic.  Cardiovascular:     Rate and Rhythm: Intermittent tachycardia  Pulmonary:      Breath sounds: Rhonchi and rales present.  Abdominal:     General: Abdomen is flat.     Palpations: Abdomen is soft.     Tenderness: Abd tenderness improving  Skin:    General: Skin is warm.  Neurological:     Mental Status: She is alert and oriented to person, place, and time.  Psychiatric:        Mood and Affect: Mood normal.  Data Reviewed:   Disposition: Status is: Inpatient  Planned Discharge Destination: Home    Time spent: 35 minutes  Author: Lucienne Minks , MD 10/06/2022 8:48 AM  For on call review [email or web address].

## 2022-10-07 ENCOUNTER — Inpatient Hospital Stay (HOSPITAL_COMMUNITY): Payer: BC Managed Care – PPO

## 2022-10-07 DIAGNOSIS — N185 Chronic kidney disease, stage 5: Secondary | ICD-10-CM | POA: Diagnosis not present

## 2022-10-07 DIAGNOSIS — I5042 Chronic combined systolic (congestive) and diastolic (congestive) heart failure: Secondary | ICD-10-CM

## 2022-10-07 DIAGNOSIS — I4729 Other ventricular tachycardia: Secondary | ICD-10-CM | POA: Diagnosis not present

## 2022-10-07 HISTORY — PX: IR FLUORO GUIDE CV LINE RIGHT: IMG2283

## 2022-10-07 HISTORY — PX: IR US GUIDE VASC ACCESS RIGHT: IMG2390

## 2022-10-07 LAB — URINALYSIS, COMPLETE (UACMP) WITH MICROSCOPIC
Bilirubin Urine: NEGATIVE
Bilirubin Urine: NEGATIVE
Glucose, UA: NEGATIVE mg/dL
Glucose, UA: NEGATIVE mg/dL
Ketones, ur: NEGATIVE mg/dL
Ketones, ur: NEGATIVE mg/dL
Leukocytes,Ua: NEGATIVE
Leukocytes,Ua: NEGATIVE
Nitrite: NEGATIVE
Nitrite: NEGATIVE
Protein, ur: 100 mg/dL — AB
Protein, ur: 100 mg/dL — AB
Specific Gravity, Urine: 1.008 (ref 1.005–1.030)
Specific Gravity, Urine: 1.015 (ref 1.005–1.030)
pH: 5 (ref 5.0–8.0)
pH: 6 (ref 5.0–8.0)

## 2022-10-07 LAB — MAGNESIUM: Magnesium: 1.8 mg/dL (ref 1.7–2.4)

## 2022-10-07 LAB — CBC
HCT: 21.4 % — ABNORMAL LOW (ref 36.0–46.0)
HCT: 21.2 % — ABNORMAL LOW (ref 36.0–46.0)
Hemoglobin: 7.2 g/dL — ABNORMAL LOW (ref 12.0–15.0)
Hemoglobin: 7.1 g/dL — ABNORMAL LOW (ref 12.0–15.0)
MCH: 29.5 pg (ref 26.0–34.0)
MCH: 29.3 pg (ref 26.0–34.0)
MCHC: 33.6 g/dL (ref 30.0–36.0)
MCHC: 33.5 g/dL (ref 30.0–36.0)
MCV: 87.7 fL (ref 80.0–100.0)
MCV: 87.6 fL (ref 80.0–100.0)
Platelets: 208 10*3/uL (ref 150–400)
Platelets: 193 10*3/uL (ref 150–400)
RBC: 2.44 MIL/uL — ABNORMAL LOW (ref 3.87–5.11)
RBC: 2.42 MIL/uL — ABNORMAL LOW (ref 3.87–5.11)
RDW: 15.7 % — ABNORMAL HIGH (ref 11.5–15.5)
RDW: 15.6 % — ABNORMAL HIGH (ref 11.5–15.5)
WBC: 5.7 10*3/uL (ref 4.0–10.5)
WBC: 5.1 10*3/uL (ref 4.0–10.5)
nRBC: 0 % (ref 0.0–0.2)
nRBC: 0 % (ref 0.0–0.2)

## 2022-10-07 LAB — COMPREHENSIVE METABOLIC PANEL
ALT: 96 U/L — ABNORMAL HIGH (ref 0–44)
AST: 125 U/L — ABNORMAL HIGH (ref 15–41)
Albumin: 2.3 g/dL — ABNORMAL LOW (ref 3.5–5.0)
Alkaline Phosphatase: 190 U/L — ABNORMAL HIGH (ref 38–126)
Anion gap: 8 (ref 5–15)
BUN: 57 mg/dL — ABNORMAL HIGH (ref 6–20)
CO2: 21 mmol/L — ABNORMAL LOW (ref 22–32)
Calcium: 7.8 mg/dL — ABNORMAL LOW (ref 8.9–10.3)
Chloride: 104 mmol/L (ref 98–111)
Creatinine, Ser: 5.31 mg/dL — ABNORMAL HIGH (ref 0.44–1.00)
GFR, Estimated: 9 mL/min — ABNORMAL LOW (ref >60)
Glucose, Bld: 133 mg/dL — ABNORMAL HIGH (ref 70–99)
Potassium: 3.8 mmol/L (ref 3.5–5.1)
Sodium: 133 mmol/L — ABNORMAL LOW (ref 135–145)
Total Bilirubin: 0.6 mg/dL (ref 0.3–1.2)
Total Protein: 5.3 g/dL — ABNORMAL LOW (ref 6.5–8.1)

## 2022-10-07 LAB — VITAMIN B12: Vitamin B-12: 547 pg/mL (ref 180–914)

## 2022-10-07 LAB — FOLATE: Folate: 13.4 ng/mL (ref >5.9)

## 2022-10-07 LAB — GLUCOSE, CAPILLARY
Glucose-Capillary: 124 mg/dL — ABNORMAL HIGH (ref 70–99)
Glucose-Capillary: 83 mg/dL (ref 70–99)
Glucose-Capillary: 135 mg/dL — ABNORMAL HIGH (ref 70–99)
Glucose-Capillary: 187 mg/dL — ABNORMAL HIGH (ref 70–99)

## 2022-10-07 LAB — IRON AND TIBC
Iron: 14 ug/dL — ABNORMAL LOW (ref 28–170)
Saturation Ratios: 8 % — ABNORMAL LOW (ref 10.4–31.8)
TIBC: 167 ug/dL — ABNORMAL LOW (ref 250–450)
UIBC: 153 ug/dL

## 2022-10-07 LAB — HEMOGLOBIN A1C
Hgb A1c MFr Bld: 7.5 % — ABNORMAL HIGH (ref 4.8–5.6)
Mean Plasma Glucose: 169 mg/dL

## 2022-10-07 LAB — PREPARE RBC (CROSSMATCH)

## 2022-10-07 LAB — PHOSPHORUS: Phosphorus: 5.9 mg/dL — ABNORMAL HIGH (ref 2.5–4.6)

## 2022-10-07 LAB — FERRITIN: Ferritin: 933 ng/mL — ABNORMAL HIGH (ref 11–307)

## 2022-10-07 LAB — HEPATITIS B SURFACE ANTIGEN: Hepatitis B Surface Ag: NONREACTIVE

## 2022-10-07 LAB — C-REACTIVE PROTEIN: CRP: 8.8 mg/dL — ABNORMAL HIGH (ref <1.0)

## 2022-10-07 MED ORDER — HYDRALAZINE HCL 50 MG PO TABS
75.0000 mg | ORAL_TABLET | Freq: Three times a day (TID) | ORAL | Status: DC
  Administered 2022-10-07 – 2022-10-13 (×13): 75 mg via ORAL
  Filled 2022-10-07 (×14): qty 1

## 2022-10-07 MED ORDER — ASPIRIN 81 MG PO TBEC
81.0000 mg | DELAYED_RELEASE_TABLET | Freq: Every day | ORAL | Status: DC
  Administered 2022-10-11 – 2022-10-15 (×3): 81 mg via ORAL
  Filled 2022-10-07 (×5): qty 1

## 2022-10-07 MED ORDER — NITROGLYCERIN 0.4 MG SL SUBL: 0.4000 mg | SUBLINGUAL_TABLET | SUBLINGUAL | Status: DC | PRN

## 2022-10-07 MED ORDER — VANCOMYCIN HCL IN DEXTROSE 1-5 GM/200ML-% IV SOLN
INTRAVENOUS | Status: AC
Start: 2022-10-07 — End: 2022-10-07
  Administered 2022-10-07: 1000 mg via INTRAVENOUS
  Filled 2022-10-07: qty 200

## 2022-10-07 MED ORDER — HYDRALAZINE HCL 20 MG/ML IJ SOLN
10.0000 mg | Freq: Four times a day (QID) | INTRAMUSCULAR | Status: DC | PRN
  Administered 2022-10-12: 10 mg via INTRAVENOUS
  Filled 2022-10-07: qty 1

## 2022-10-07 MED ORDER — SODIUM CHLORIDE 0.9% IV SOLUTION: Freq: Once | INTRAVENOUS | Status: AC

## 2022-10-07 MED ORDER — FENTANYL CITRATE (PF) 100 MCG/2ML IJ SOLN
INTRAMUSCULAR | Status: AC | PRN
  Administered 2022-10-07 (×2): 25 ug via INTRAVENOUS

## 2022-10-07 MED ORDER — LIDOCAINE-EPINEPHRINE 1 %-1:100000 IJ SOLN
INTRAMUSCULAR | Status: AC
  Administered 2022-10-07: 10 mL
  Filled 2022-10-07: qty 1

## 2022-10-07 MED ORDER — MIDAZOLAM HCL 2 MG/2ML IJ SOLN
INTRAMUSCULAR | Status: AC | PRN
  Administered 2022-10-07: 1 mg via INTRAVENOUS

## 2022-10-07 MED ORDER — ALTEPLASE 2 MG IJ SOLR: 2.0000 mg | Freq: Once | INTRAMUSCULAR | Status: DC | PRN

## 2022-10-07 MED ORDER — HEPARIN SODIUM (PORCINE) 1000 UNIT/ML IJ SOLN
INTRAMUSCULAR | Status: AC
  Administered 2022-10-07: 3200 [IU]
  Filled 2022-10-07: qty 10

## 2022-10-07 MED ORDER — TOBRAMYCIN 0.3 % OP SOLN: 1.0000 [drp] | OPHTHALMIC | Status: DC

## 2022-10-07 MED ORDER — LIDOCAINE HCL 1 % IJ SOLN
INTRAMUSCULAR | Status: AC
  Filled 2022-10-07: qty 20

## 2022-10-07 MED ORDER — FENTANYL CITRATE (PF) 100 MCG/2ML IJ SOLN
INTRAMUSCULAR | Status: AC
  Filled 2022-10-07: qty 2

## 2022-10-07 MED ORDER — VANCOMYCIN HCL IN DEXTROSE 1-5 GM/200ML-% IV SOLN: 1000.0000 mg | Freq: Once | INTRAVENOUS | Status: AC

## 2022-10-07 MED ORDER — ANTICOAGULANT SODIUM CITRATE 4% (200MG/5ML) IV SOLN: 5.0000 mL | Status: DC | PRN

## 2022-10-07 MED ORDER — OXYCODONE-ACETAMINOPHEN 5-325 MG PO TABS
1.0000 | ORAL_TABLET | Freq: Four times a day (QID) | ORAL | Status: DC | PRN
  Administered 2022-10-09 – 2022-10-13 (×4): 1 via ORAL
  Filled 2022-10-07 (×4): qty 1

## 2022-10-07 MED ORDER — ALBUTEROL SULFATE (2.5 MG/3ML) 0.083% IN NEBU: 2.5000 mg | INHALATION_SOLUTION | Freq: Four times a day (QID) | RESPIRATORY_TRACT | Status: DC | PRN

## 2022-10-07 MED ORDER — PANTOPRAZOLE SODIUM 40 MG IV SOLR
40.0000 mg | Freq: Two times a day (BID) | INTRAVENOUS | Status: DC
  Administered 2022-10-07 – 2022-10-10 (×7): 40 mg via INTRAVENOUS
  Filled 2022-10-07 (×7): qty 10

## 2022-10-07 MED ORDER — ISOSORBIDE DINITRATE 10 MG PO TABS
20.0000 mg | ORAL_TABLET | Freq: Three times a day (TID) | ORAL | Status: DC
  Administered 2022-10-07 – 2022-10-15 (×14): 20 mg via ORAL
  Filled 2022-10-07 (×18): qty 2

## 2022-10-07 MED ORDER — CHLORHEXIDINE GLUCONATE CLOTH 2 % EX PADS
6.0000 | MEDICATED_PAD | Freq: Every day | CUTANEOUS | Status: DC
  Administered 2022-10-07 – 2022-10-15 (×8): 6 via TOPICAL

## 2022-10-07 MED ORDER — MIDAZOLAM HCL 2 MG/2ML IJ SOLN
INTRAMUSCULAR | Status: AC
  Filled 2022-10-07: qty 2

## 2022-10-07 MED ORDER — HEPARIN SODIUM (PORCINE) 1000 UNIT/ML DIALYSIS
1000.0000 [IU] | INTRAMUSCULAR | Status: DC | PRN
  Administered 2022-10-07: 3200 [IU]
  Filled 2022-10-07: qty 1

## 2022-10-07 MED ORDER — SODIUM CHLORIDE 0.9 % IV SOLN
250.0000 mg | INTRAVENOUS | Status: AC
  Administered 2022-10-07 – 2022-10-11 (×3): 250 mg via INTRAVENOUS
  Filled 2022-10-07 (×6): qty 20

## 2022-10-07 NOTE — Consult Note (Signed)
Chief Complaint: Patient was seen in consultation today for  Chief Complaint  Patient presents with   Abdominal Pain   AICD Problem   at the request of Madelon Lips, MD  Supervising Physician: Sandi Mariscal  Patient Status: [facility] - In-pt  History of Present Illness: 52 y.o. female inpatient. 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 Presented to the ED at [facility] on 3.1.24 with abdominal pain found to be in acute on chronic kidney failure. Team is requesting a tunneled HD catheter placement.   Past Medical History:  Diagnosis Date   Anemia    Anxiety    Arthritis    Asthma    CHF (congestive heart failure) ([place])    Chronic kidney disease    Depression    Diabetic ulcer of left foot ([place]) 05/12/2013   Gastroparesis    Generalized abdominal pain    History of chicken pox    Loss of weight 12/02/2019   Migraines    Mixed hyperlipidemia 09/05/2022   Mood disorder ([place])    anxiety   Myocardial infarction ([place])    Prurigo nodularis    with diabetic dermopathy   Type 1 diabetes, uncontrolled, with neuropathy    [place]   Ulcers of both lower legs ([place]) 02/20/2014    Past Surgical History:  Procedure Laterality Date   BIOPSY  08/11/2020   Procedure: BIOPSY;  Surgeon: Ladene Artist, MD;  Location: [facility]OS[facility];  Service: Endoscopy;;   BIOPSY  04/02/2022   Procedure: BIOPSY;  Surgeon: Jackquline Denmark, MD;  Location: [facility]OSCOPY;  Service: Gastroenterology;;   ESOPHAGOGASTRODUODENOSCOPY (EGD) WITH PROPOFOL N/A 08/11/2020   Procedure: ESOPHAGOGASTRODUODENOSCOPY (EGD) WITH PROPOFOL;  Surgeon: Ladene Artist, MD;  Location: [facility];  Service: Endoscopy;  Laterality: N/A;   ESOPHAGOGASTRODUODENOSCOPY (EGD) WITH PROPOFOL N/A 04/02/2022   Procedure: ESOPHAGOGASTRODUODENOSCOPY (EGD) WITH PROPOFOL;  Surgeon: Jackquline Denmark, MD;  Location: [facility];  Service: Gastroenterology;   Laterality: N/A;   ESOPHAGOGASTRODUODENOSCOPY (EGD) WITH PROPOFOL N/A 05/23/2022   Procedure: ESOPHAGOGASTRODUODENOSCOPY (EGD) WITH PROPOFOL;  Surgeon: Lucilla Lame, MD;  Location: [facility] ENDOSCOPY;  Service: Endoscopy;  Laterality: N/A;   ICD IMPLANT N/A 04/09/2022   Procedure: ICD IMPLANT;  Surgeon: Constance Haw, MD;  Location: [facility];  Service: Cardiovascular;  Laterality: N/A;   treadmill stress test  01/2013   WNL, low risk study    Allergies: Atorvastatin, Dulaglutide, Metoclopramide, Sertraline, Zofran [ondansetron], Amoxicillin, Lantus [insulin glargine], and Miconazole nitrate  Medications: Prior to Admission medications   Medication Sig Start Date End Date Taking? Authorizing Provider  cloNIDine (CATAPRES - DOSED IN MG/24 HR) 0.1 mg/24hr patch 0.1 mg once a week. 09/27/22  Yes [provider]  acetaminophen (TYLENOL) 500 MG tablet as needed for moderate pain or mild pain.    [provider]  albuterol (PROAIR HFA) 108 (90 Base) MCG/ACT inhaler Inhale 2 puffs into the lungs every 6 (six) hours as needed for wheezing or shortness of breath. 07/03/20   [name], PA-C  Ensure Max Protein (ENSURE MAX PROTEIN) LIQD Take 330 mLs (11 oz total) by mouth daily. 04/30/22   Hongalgi, [name], MD  hydrALAZINE (APRESOLINE) 50 MG tablet Take 1 tablet (50 mg total) by mouth 3 (three) times daily. 07/17/22   Domenic Polite, MD  hydrOXYzine (ATARAX) 50 MG tablet Take 50 mg by mouth in the morning, at noon, and at bedtime.  [provider]  insulin aspart (NOVOLOG) 100 UNIT/ML injection Inject 150 Units into the skin See admin instructions. Patient uses this with the [place]. Units vary according to blood sugar. Loads 150 units every 3 days.    [provider]  LORazepam (ATIVAN) 0.5 MG tablet Take 1 tablet (0.5 mg total) by mouth every 8 (eight) hours as needed for anxiety (Refractory nausea and vomiting). 07/17/22   Domenic Polite, MD   metoCLOPramide (REGLAN) 10 MG tablet Take 1 tablet (10 mg total) by mouth 3 (three) times daily before meals. 07/17/22   Domenic Polite, MD  metoprolol succinate (TOPROL-XL) 50 MG 24 hr tablet Take 1 tablet (50 mg total) by mouth daily. Take with or immediately following a meal. 09/05/22   Chandrasekhar, Mahesh A, MD  Multiple Vitamin (MULTIVITAMIN WITH MINERALS) TABS tablet Take 1 tablet by mouth daily. 04/30/22   Hongalgi, [name], MD  nitroGLYCERIN (NITROSTAT) 0.4 MG SL tablet Place 1 tablet (0.4 mg total) under the tongue every 5 (five) minutes as needed for chest pain. 09/05/22 12/04/22  Werner Lean, MD  oxyCODONE-acetaminophen (PERCOCET) 5-325 MG tablet Take 1 tablet by mouth every 6 (six) hours as needed for severe pain. 10/01/22   Rosetta Posner, MD  pantoprazole (PROTONIX) 40 MG tablet Take 1 tablet (40 mg total) by mouth 2 (two) times daily before a meal. 05/24/22 09/08/22  Rai, Ripudeep K, MD  polyethylene glycol (MIRALAX / GLYCOLAX) 17 g packet Take 17 g by mouth daily as needed for moderate constipation.    [provider]  pravastatin (PRAVACHOL) 40 MG tablet Take 1 tablet (40 mg total) by mouth every evening. 09/05/22   Chandrasekhar, Lyda Kalata A, MD  pregabalin (LYRICA) 50 MG capsule Take 50 mg by mouth 3 (three) times daily as needed (neuropathy). 12/05/21   [provider]  prochlorperazine (COMPAZINE) 10 MG tablet Take 1 tablet (10 mg total) by mouth every 6 (six) hours as needed for nausea or vomiting. 05/24/22   Rai, [name], MD  promethazine (PHENERGAN) 12.5 MG tablet Take 12.5 mg by mouth 2 (two) times daily as needed for nausea or vomiting.    [provider]  promethazine (PHENERGAN) 25 MG suppository Unwrap and place 1 suppository (25 mg total) rectally every 6 (six) hours as needed for nausea or vomiting. 07/17/22   Domenic Polite, MD  scopolamine (TRANSDERM-SCOP) 1 MG/3DAYS Place 1 patch (1.5 mg total) onto the skin every 3 (three) days. 05/26/22    Rai, Ripudeep K, MD  tobramycin (TOBREX) 0.3 % ophthalmic solution Place 1-2 drops into both eyes See admin instructions. Instill 1-2 drops into both eyes 3 times daily the day before, day of, and the day after every 6 weeks eye injections per patient 11/17/20   [provider]  torsemide (DEMADEX) 20 MG tablet TAKE THREE TABLETS BY MOUTH DAILY 08/30/22   Darrick Grinder D, NP     Family History  Problem Relation Age of Onset   Diabetes Mother        type 2   Hypertension Mother    Thyroid disease Mother    Bipolar disorder Mother    Heart disease Mother    Calcium disorder Mother    Cancer Maternal Grandmother        Breast, stomach   Cancer Paternal Grandmother        stomach, lung (smoker)   Diabetes Paternal Grandmother    Diabetes Paternal Grandfather    Heart failure Sister    Diabetes  Sister    Stroke Maternal Aunt    Cancer Maternal Uncle        prostate   CAD Maternal Aunt        stents   Cancer Maternal Aunt 64       ovarian    Social History   Socioeconomic History   Marital status: Divorced    Spouse name: Not on file   Number of children: 2   Years of education: Not on file   Highest education level: Not on file  Occupational History   Occupation: Freight forwarder  Tobacco Use   Smoking status: Never   Smokeless tobacco: Never  Vaping Use   Vaping Use: Never used  Substance and Sexual Activity   Alcohol use: Not Currently   Drug use: No   Sexual activity: Yes    Partners: Male    Birth control/protection: None  Other Topics Concern   Not on file  Social History Narrative   Caffeine use: daily   Occupation: cleans houses   Regular exercise: yes, active 5x/wk   Diet: good water, fruits/vegetables daily   Social Determinants of Health   Financial Resource Strain: Low Risk  (07/15/2022)   Overall Financial Resource Strain (CARDIA)    Difficulty of Paying Living Expenses: Not very hard  Food Insecurity: No Food Insecurity (10/04/2022)   Hunger Vital  Sign    Worried About Running Out of Food in the Last Year: Never true    [place] in the Last Year: Never true  Transportation Needs: No Transportation Needs (10/04/2022)   PRAPARE - Hydrologist (Medical): No    Lack of Transportation (Non-Medical): No  Physical Activity: Not on file  Stress: Not on file  Social Connections: Not on file   Review of Systems  Constitutional:  Positive for activity change.  HENT: Negative.    Eyes: Negative.   Respiratory:  Positive for cough and shortness of breath.   Cardiovascular: Negative.   Gastrointestinal: Negative.   Neurological:  Positive for tremors.  Psychiatric/Behavioral: Negative.     Vital Signs: BP (!) 144/61   Pulse 94   Temp (!) 101.8 F (38.8 C)   Resp (!) 29   Ht 6' (1.829 m)   Wt 179 lb 7.3 oz (81.4 kg)   LMP 11/17/2021 (Approximate)   SpO2 98%   BMI 24.34 kg/m   Physical Exam Vitals reviewed.  Constitutional:      General: She is awake.     Appearance: She is ill-appearing.  HENT:     Mouth/Throat:     Mouth: Mucous membranes are dry.     Pharynx: Oropharynx is clear.  Cardiovascular:     Pulses: Normal pulses.  Pulmonary:     Comments: noticeable increased work of breathing, but not apparent distress Skin:    General: Skin is warm and dry.  Neurological:     General: No focal deficit present.  Psychiatric:        Mood and Affect: Mood normal.        Behavior: Behavior normal. Behavior is cooperative.     Imaging: DG Chest Port 1 View  Result Date: 10/07/2022 CLINICAL DATA:  Shortness of breath EXAM: PORTABLE CHEST 1 VIEW COMPARISON:  Three days ago FINDINGS: Cardiomegaly and vascular pedicle widening with diffuse interstitial opacity. No air bronchogram. No pneumothorax. ICD lead into the right ventricle. Artifact from EKG leads. IMPRESSION: Suspect mild CHF. Electronically Signed   By: Roderic Palau  Watts M.D.   On: 10/07/2022 06:37   DG Abd Portable 1V  Result Date:  10/07/2022 CLINICAL DATA:  Nausea and vomiting EXAM: PORTABLE ABDOMEN - 1 VIEW COMPARISON:  Abdominal CT from 2 days ago FINDINGS: The bowel gas pattern is normal. No radio-opaque calculi or other significant radiographic abnormality are seen. IMPRESSION: Normal bowel gas pattern. Electronically Signed   By: Jorje Guild M.D.   On: 10/07/2022 06:36   VAS US DUPLEX DIALYSIS ACCESS (AVF, AVG)  Result Date: 10/06/2022 DIALYSIS ACCESS Patient Name:  [name]  Date of Exam:   10/06/2022 Medical Rec #: [id number]       Accession #:    [id number] Date of Birth: Mar 28, 1971      Patient Gender: F Patient Age:   66 years Exam Location:  [facility] Procedure:      VAS US DUPLEX DIALYSIS ACCESS (AVF, AVG) Referring Phys: Lawson Radar --------------------------------------------------------------------------------  Reason for Exam: No palpable thrill for AVF/AVG. Access Site: Left Upper Extremity. Access Type: Brachio-basilic, status post first stage 10/01/22. Comparison Study: No prior study Performing Technologist: Sharion Dove RVS  Examination Guidelines: A complete evaluation includes B-mode imaging, spectral Doppler, color Doppler, and power Doppler as needed of all accessible portions of each vessel. Unilateral testing is considered an integral part of a complete examination. Limited examinations for reoccurring indications may be performed as noted.    Summary: Brachiobasilic AVF appears thrombosed  *See table(s) above for measurements and observations.  Diagnosing physician: Monica Martinez MD Electronically signed by Monica Martinez MD on 10/06/2022 at 1:02:35 PM.   --------------------------------------------------------------------------------   Final    ECHOCARDIOGRAM COMPLETE  Result Date: 10/05/2022    ECHOCARDIOGRAM REPORT   Patient Name:   [name] Date of Exam: 10/05/2022 Medical Rec #:  [id number]      Height:       72.0 in Accession #:    [id number]     Weight:       179.5 lb Date of  Birth:  1971-05-31     BSA:          2.035 m Patient Age:    72 years       BP:           120/72 mmHg Patient Gender: F              HR:           94 bpm. Exam Location:  Inpatient Procedure: 2D Echo, Cardiac Doppler and Color Doppler Indications:    Chest Pain R07.9  History:        Patient has no prior history of Echocardiogram examinations.                 CHF, CAD and Previous Myocardial Infarction; Risk                 Factors:Diabetes and Hypertension.  Sonographer:    Luane School RDCS Referring Phys: [id number] [place]  1. Left ventricular ejection fraction, by estimation, is 40 to 45%. The left ventricle has mildly decreased function. The left ventricle demonstrates global hypokinesis. There is mild concentric left ventricular hypertrophy. Left ventricular diastolic parameters are consistent with Grade III diastolic dysfunction (restrictive). Elevated left atrial pressure.  2. Right ventricular systolic function is mildly reduced. The right ventricular size is moderately enlarged. There is moderately elevated pulmonary artery systolic pressure.  3. Left atrial size was moderately dilated.  4. Right atrial size was moderately dilated.  5. The  mitral valve is normal in structure. No evidence of mitral valve regurgitation. No evidence of mitral stenosis.  6. Tricuspid valve regurgitation is moderate to severe.  7. The aortic valve is tricuspid. Aortic valve regurgitation is not visualized. No aortic stenosis is present.  8. The inferior vena cava is dilated in size with <50% respiratory variability, suggesting right atrial pressure of 15 mmHg. Comparison(s): Prior images reviewed side by side. The left ventricular function is significantly worse. The right ventricular systolic function is worse. TR severity and PA hypertension are also worse, with evidence of hypervolemia. FINDINGS  Left Ventricle: Left ventricular ejection fraction, by estimation, is 40 to 45%. The left ventricle has mildly  decreased function. The left ventricle demonstrates global hypokinesis. The left ventricular internal cavity size was normal in size. There is  mild concentric left ventricular hypertrophy. Left ventricular diastolic parameters are consistent with Grade III diastolic dysfunction (restrictive). Elevated left atrial pressure. Right Ventricle: The right ventricular size is moderately enlarged. No increase in right ventricular wall thickness. Right ventricular systolic function is mildly reduced. There is moderately elevated pulmonary artery systolic pressure. The tricuspid regurgitant velocity is 3.05 m/s, and with an assumed right atrial pressure of 15 mmHg, the estimated right ventricular systolic pressure is [id number]. Left Atrium: Left atrial size was moderately dilated. Right Atrium: Right atrial size was moderately dilated. Pericardium: Trivial pericardial effusion is present. Mitral Valve: The mitral valve is normal in structure. There is mild thickening of the mitral valve leaflet(s). Mild mitral annular calcification. No evidence of mitral valve regurgitation. No evidence of mitral valve stenosis. Tricuspid Valve: The tricuspid valve is normal in structure. Tricuspid valve regurgitation is moderate to severe. Aortic Valve: The aortic valve is tricuspid. Aortic valve regurgitation is not visualized. No aortic stenosis is present. Pulmonic Valve: The pulmonic valve was normal in structure. Pulmonic valve regurgitation is trivial. Aorta: The aortic root and ascending aorta are structurally normal, with no evidence of dilitation. Venous: The inferior vena cava is dilated in size with less than 50% respiratory variability, suggesting right atrial pressure of 15 mmHg. IAS/Shunts: No atrial level shunt detected by color flow Doppler. Additional Comments: A device lead is visualized in the right ventricle. There is pleural effusion in the left lateral region.  LEFT VENTRICLE PLAX 2D LVIDd:         4.70 cm      Diastology LVIDs:         3.80 cm     LV e' medial:    6.53 cm/s LV PW:         1.30 cm     LV E/e' medial:  16.1 LV IVS:        1.14 cm     LV e' lateral:   8.05 cm/s LVOT diam:     2.00 cm     LV E/e' lateral: 13.0 LV SV:         43 LV SV Index:   21 LVOT Area:     3.14 cm  LV Volumes (MOD) LV vol d, MOD A2C: 85.9 ml LV vol d, MOD A4C: 78.0 ml LV vol s, MOD A2C: 54.3 ml LV vol s, MOD A4C: 53.8 ml LV SV MOD A2C:     31.6 ml LV SV MOD A4C:     78.0 ml LV SV MOD BP:      31.4 ml RIGHT VENTRICLE            IVC RV S prime:  9.90 cm/s  IVC diam: 2.40 cm TAPSE (M-mode): 1.7 cm LEFT ATRIUM           Index        RIGHT ATRIUM           Index LA diam:      4.67 cm 2.30 cm/m   RA Area:     19.40 cm LA Vol (A2C): 82.5 ml 40.55 ml/m  RA Volume:   58.20 ml  28.60 ml/m LA Vol (A4C): 66.5 ml 32.68 ml/m  AORTIC VALVE LVOT Vmax:   77.50 cm/s LVOT Vmean:  58.000 cm/s LVOT VTI:    0.138 m  AORTA Ao Root diam: 3.40 cm Ao Asc diam:  3.30 cm Ao Desc diam: 2.40 cm MITRAL VALVE                TRICUSPID VALVE MV Area (PHT): 6.96 cm     TR Peak grad:   37.2 mmHg MV Decel Time: 109 msec     TR Vmax:        305.00 cm/s MV E velocity: 105.00 cm/s MV A velocity: 41.60 cm/s   SHUNTS MV E/A ratio:  2.52         Systemic VTI:  0.14 m                             Systemic Diam: 2.00 cm Dani Gobble Croitoru MD Electronically signed by Sanda Klein MD Signature Date/Time: 10/05/2022/2:53:24 PM    Final    CT ABDOMEN PELVIS WO CONTRAST  Result Date: 10/05/2022 CLINICAL DATA:  Abdominal pain, vomiting EXAM: CT ABDOMEN AND PELVIS WITHOUT CONTRAST TECHNIQUE: Multidetector CT imaging of the abdomen and pelvis was performed following the standard protocol without IV contrast. RADIATION DOSE REDUCTION: This exam was performed according to the departmental dose-optimization program which includes automated exposure control, adjustment of the mA and/or kV according to patient size and/or use of iterative reconstruction technique. COMPARISON:  05/23/2022  FINDINGS: Lower chest: Moderate bilateral pleural effusions with associated compressive atelectasis in the bilateral lower lobes. Additional patchy anterior right lower lobe opacity, possibly reflecting pneumonia, less likely atelectasis. Mild lingular and left lower lobe atelectasis. Hepatobiliary: Unenhanced liver is unremarkable. Gallbladder is unremarkable. No intrahepatic or extrahepatic duct dilatation. Pancreas: Within normal limits. Spleen: Within normal limits. Adrenals/Urinary Tract: Adrenal glands are within normal limits. Kidneys are within normal limits. No renal, ureteral, or bladder calculi. No hydronephrosis. Mildly thick-walled bladder. Stomach/Bowel: Stomach is within normal limits. No evidence of bowel obstruction. Normal appendix (series 3/image 66). No colonic wall thickening or inflammatory changes. Vascular/Lymphatic: No evidence of abdominal aortic aneurysm. No suspicious abdominopelvic lymphadenopathy. Reproductive: Uterus is within normal limits. Bilateral ovaries are within normal limits. Other: Trace pelvic ascites. Musculoskeletal: Visualized osseous structures are within normal limits. IMPRESSION: No evidence of bowel obstruction. Normal appendix. No CT findings to account for the patient's abdominal pain. Mildly thick-walled bladder, correlate for cystitis. Moderate bilateral pleural effusions with associated compressive atelectasis in the bilateral lower lobes. Additional patchy anterior right lower lobe opacity, possibly reflecting pneumonia, less likely atelectasis. Electronically Signed   By: Julian Hy M.D.   On: 10/05/2022 01:13   DG Chest 2 View  Result Date: 10/04/2022 CLINICAL DATA:  Shortness of breath EXAM: CHEST - 2 VIEW COMPARISON:  CXR 09/01/22 FINDINGS: Left-sided single lead cardiac device in place with likely unchanged lead positioning when accounting for differences in positioning. No pleural effusion. No pneumothorax. Unchanged slightly enlarged  cardiac  contours. Unchanged mediastinal contours. Compared to prior exam there are new bilateral perihilar and bibasilar airspace opacities, which could represent atypical infection or pulmonary edema. No radiographically apparent displaced rib fractures. Vertebral body heights are maintained. IMPRESSION: Compared to prior exam there are new bilateral perihilar and bibasilar airspace opacities, which could represent atypical infection or pulmonary edema. Electronically Signed   By: Marin Roberts M.D.   On: 10/04/2022 10:20   CUP PACEART INCLINIC DEVICE CHECK  Result Date: 09/13/2022 ICD check in clinic. Normal device function. Thresholds and sensing consistent with previous device measurements. Impedance trends stable over time. No mode switches. No ventricular arrhythmias. Histogram distribution appropriate for patient and level of  activity. No changes made this session. Device programmed at appropriate safety margins. Device programmed to optimize intrinsic conduction. Estimated longevity 10 yr, 41mo Pt enrolled in remote follow-up.   Labs:  CBC: Recent Labs    10/05/22 0242 10/06/22 0730 10/07/22 0256 10/07/22 0754  WBC 7.1 6.7 5.7 5.1  HGB 7.7* 7.4* 7.2* 7.1*  HCT 23.1* 23.4* 21.4* 21.2*  PLT 236 207 208 193    COAGS: Recent Labs    04/01/22 1900 04/21/22 1057 08/27/22 1415  INR 0.9 0.9 1.0    BMP: Recent Labs    10/04/22 1000 10/05/22 0242 10/06/22 0730 10/07/22 0256  NA 140 140 137 133*  K 4.1 3.8 4.3 3.8  CL 107 109 106 104  CO2 23 22 16* 21*  GLUCOSE 288* 184* 227* 133*  BUN 37* 39* 48* 57*  CALCIUM 8.9 8.0* 8.0* 7.8*  CREATININE 3.61* 4.12* 4.73* 5.31*  GFRNONAA 15* 12* 11* 9*    LIVER FUNCTION TESTS: Recent Labs    10/04/22 1000 10/05/22 0242 10/06/22 0730 10/07/22 0256  BILITOT 1.0 0.9 0.7 0.6  AST 9* 14* 42* 125*  ALT 14 11 36 96*  ALKPHOS 83 63 136* 190*  PROT 7.0 5.1* 5.2* 5.3*  ALBUMIN 3.6 2.1* 2.2* 2.3*     Assessment and Plan:  52y.o.  female inpatient. 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 [facility] found to have RSV and be in acute on chronic kidney failure. Team is requesting a tunneled HD catheter placement.   Chest xray from 3.4.24 shows no lines or drains.BUN 57, Cr 5.31, albumin 2.3, AST 125, ALT 96, total protein 5.3. ASA is listed as a future medication. Last dose given on 3.3.24 @ 08:34. Patient is on subcutaneous prophylactic dose of heparin. Last dose given on 3.3.24 @ 20:49 Allergies include zofran, PCN. Patient has been NPO since midnight.   During exam, transfusing RBCs were pulled due to transfusion reaction of fever.   Risks and benefits discussed with the patient including, but not limited to bleeding, infection, vascular injury, pneumothorax which may require chest tube placement, air embolism or even death  All of the patient's questions were answered, patient is agreeable to proceed. Consent signed and in chart.   Thank you for this interesting consult.  I greatly enjoyed meeting HBrooklan Dusekand look forward to participating in their care.  A copy of this report was sent to the requesting provider on this date.  Electronically Signed: JJacqualine Mau NP 10/07/2022, 10:38 AM   I spent a total of 20 Minutes in face to face in clinical consultation, greater than 50% of which was counseling/coordinating care for  CKD

## 2022-10-07 NOTE — Consult Note (Incomplete)
Consultation Note   Referring Provider:  [facility] PCP: [name], PA Primary Gastroenterologist: Unclear but seems to be unassigned. See was followed by [place] but then changed to [facility] but then had a telehealth visit with [facility] on 09/27/22.  Reason for consultation: nausea / vomiting   Hospital Day: 4  ASSESSMENT AND PLAN:        HPI:  Patient is a 52 y.o. year old female with a past medical history of  diabetes, CHF, anxiety   See [facility] for any additional medical problems.  Rakiah has had multiple hospital admissions for N/V.    Previous GI History / Evaluation :       Recent Imaging and Labs: DG Chest Port 1 View  Result Date: 10/07/2022 CLINICAL DATA:  Shortness of breath EXAM: PORTABLE CHEST 1 VIEW COMPARISON:  Three days ago FINDINGS: Cardiomegaly and vascular pedicle widening with diffuse interstitial opacity. No air bronchogram. No pneumothorax. ICD lead into the right ventricle. Artifact from EKG leads. IMPRESSION: Suspect mild CHF. Electronically Signed   By: Jorje Guild M.D.   On: 10/07/2022 06:37   DG Abd Portable 1V  Result Date: 10/07/2022 CLINICAL DATA:  Nausea and vomiting EXAM: PORTABLE ABDOMEN - 1 VIEW COMPARISON:  Abdominal CT from 2 days ago FINDINGS: The bowel gas pattern is normal. No radio-opaque calculi or other significant radiographic abnormality are seen. IMPRESSION: Normal bowel gas pattern. Electronically Signed   By: Jorje Guild M.D.   On: 10/07/2022 06:36   VAS US DUPLEX DIALYSIS ACCESS (AVF, AVG)  Result Date: 10/06/2022 DIALYSIS ACCESS Patient Name:  ERIAL ANGERMEIER  Date of Exam:   10/06/2022 Medical Rec #: [id number]       Accession #:    [id number] Date of Birth: 1971-08-02      Patient Gender: F Patient Age:   1 years Exam Location:  [facility] Procedure:      VAS US DUPLEX DIALYSIS ACCESS (AVF, AVG) Referring Phys: Lawson Radar  --------------------------------------------------------------------------------  Reason for Exam: No palpable thrill for AVF/AVG. Access Site: Left Upper Extremity. Access Type: Brachio-basilic, status post first stage 10/01/22. Comparison Study: No prior study Performing Technologist: Sharion Dove RVS  Examination Guidelines: A complete evaluation includes B-mode imaging, spectral Doppler, color Doppler, and power Doppler as needed of all accessible portions of each vessel. Unilateral testing is considered an integral part of a complete examination. Limited examinations for reoccurring indications may be performed as noted.    Summary: Brachiobasilic AVF appears thrombosed  *See table(s) above for measurements and observations.  Diagnosing physician: Monica Martinez MD Electronically signed by Monica Martinez MD on 10/06/2022 at 1:02:35 PM.   --------------------------------------------------------------------------------   Final    ECHOCARDIOGRAM COMPLETE  Result Date: 10/05/2022    ECHOCARDIOGRAM REPORT   Patient Name:   JAZARI CEPIN Date of Exam: 10/05/2022 Medical Rec #:  [id number]      Height:       72.0 in Accession #:    [id number]     Weight:       179.5 lb Date of Birth:  September 08, 1970     BSA:          2.035 m Patient Age:    56 years  BP:           120/72 mmHg Patient Gender: F              HR:           94 bpm. Exam Location:  Inpatient Procedure: 2D Echo, Cardiac Doppler and Color Doppler Indications:    Chest Pain R07.9  History:        Patient has no prior history of Echocardiogram examinations.                 CHF, CAD and Previous Myocardial Infarction; Risk                 Factors:Diabetes and Hypertension.  Sonographer:    Luane School RDCS Referring Phys: [id number] Miller  1. Left ventricular ejection fraction, by estimation, is 40 to 45%. The left ventricle has mildly decreased function. The left ventricle demonstrates global hypokinesis. There is mild concentric left  ventricular hypertrophy. Left ventricular diastolic parameters are consistent with Grade III diastolic dysfunction (restrictive). Elevated left atrial pressure.  2. Right ventricular systolic function is mildly reduced. The right ventricular size is moderately enlarged. There is moderately elevated pulmonary artery systolic pressure.  3. Left atrial size was moderately dilated.  4. Right atrial size was moderately dilated.  5. The mitral valve is normal in structure. No evidence of mitral valve regurgitation. No evidence of mitral stenosis.  6. Tricuspid valve regurgitation is moderate to severe.  7. The aortic valve is tricuspid. Aortic valve regurgitation is not visualized. No aortic stenosis is present.  8. The inferior vena cava is dilated in size with <50% respiratory variability, suggesting right atrial pressure of 15 mmHg. Comparison(s): Prior images reviewed side by side. The left ventricular function is significantly worse. The right ventricular systolic function is worse. TR severity and PA hypertension are also worse, with evidence of hypervolemia. FINDINGS  Left Ventricle: Left ventricular ejection fraction, by estimation, is 40 to 45%. The left ventricle has mildly decreased function. The left ventricle demonstrates global hypokinesis. The left ventricular internal cavity size was normal in size. There is  mild concentric left ventricular hypertrophy. Left ventricular diastolic parameters are consistent with Grade III diastolic dysfunction (restrictive). Elevated left atrial pressure. Right Ventricle: The right ventricular size is moderately enlarged. No increase in right ventricular wall thickness. Right ventricular systolic function is mildly reduced. There is moderately elevated pulmonary artery systolic pressure. The tricuspid regurgitant velocity is 3.05 m/s, and with an assumed right atrial pressure of 15 mmHg, the estimated right ventricular systolic pressure is [id number]. Left Atrium: Left  atrial size was moderately dilated. Right Atrium: Right atrial size was moderately dilated. Pericardium: Trivial pericardial effusion is present. Mitral Valve: The mitral valve is normal in structure. There is mild thickening of the mitral valve leaflet(s). Mild mitral annular calcification. No evidence of mitral valve regurgitation. No evidence of mitral valve stenosis. Tricuspid Valve: The tricuspid valve is normal in structure. Tricuspid valve regurgitation is moderate to severe. Aortic Valve: The aortic valve is tricuspid. Aortic valve regurgitation is not visualized. No aortic stenosis is present. Pulmonic Valve: The pulmonic valve was normal in structure. Pulmonic valve regurgitation is trivial. Aorta: The aortic root and ascending aorta are structurally normal, with no evidence of dilitation. Venous: The inferior vena cava is dilated in size with less than 50% respiratory variability, suggesting right atrial pressure of 15 mmHg. IAS/Shunts: No atrial level shunt detected by color flow Doppler. Additional Comments: A device lead is  visualized in the right ventricle. There is pleural effusion in the left lateral region.  LEFT VENTRICLE PLAX 2D LVIDd:         4.70 cm     Diastology LVIDs:         3.80 cm     LV e' medial:    6.53 cm/s LV PW:         1.30 cm     LV E/e' medial:  16.1 LV IVS:        1.14 cm     LV e' lateral:   8.05 cm/s LVOT diam:     2.00 cm     LV E/e' lateral: 13.0 LV SV:         43 LV SV Index:   21 LVOT Area:     3.14 cm  LV Volumes (MOD) LV vol d, MOD A2C: 85.9 ml LV vol d, MOD A4C: 78.0 ml LV vol s, MOD A2C: 54.3 ml LV vol s, MOD A4C: 53.8 ml LV SV MOD A2C:     31.6 ml LV SV MOD A4C:     78.0 ml LV SV MOD BP:      31.4 ml RIGHT VENTRICLE            IVC RV S prime:     9.90 cm/s  IVC diam: 2.40 cm TAPSE (M-mode): 1.7 cm LEFT ATRIUM           Index        RIGHT ATRIUM           Index LA diam:      4.67 cm 2.30 cm/m   RA Area:     19.40 cm LA Vol (A2C): 82.5 ml 40.55 ml/m  RA Volume:    58.20 ml  28.60 ml/m LA Vol (A4C): 66.5 ml 32.68 ml/m  AORTIC VALVE LVOT Vmax:   77.50 cm/s LVOT Vmean:  58.000 cm/s LVOT VTI:    0.138 m  AORTA Ao Root diam: 3.40 cm Ao Asc diam:  3.30 cm Ao Desc diam: 2.40 cm MITRAL VALVE                TRICUSPID VALVE MV Area (PHT): 6.96 cm     TR Peak grad:   37.2 mmHg MV Decel Time: 109 msec     TR Vmax:        305.00 cm/s MV E velocity: 105.00 cm/s MV A velocity: 41.60 cm/s   SHUNTS MV E/A ratio:  2.52         Systemic VTI:  0.14 m                             Systemic Diam: 2.00 cm Dani Gobble Croitoru MD Electronically signed by Sanda Klein MD Signature Date/Time: 10/05/2022/2:53:24 PM    Final    CT ABDOMEN PELVIS WO CONTRAST  Result Date: 10/05/2022 CLINICAL DATA:  Abdominal pain, vomiting EXAM: CT ABDOMEN AND PELVIS WITHOUT CONTRAST TECHNIQUE: Multidetector CT imaging of the abdomen and pelvis was performed following the standard protocol without IV contrast. RADIATION DOSE REDUCTION: This exam was performed according to the departmental dose-optimization program which includes automated exposure control, adjustment of the mA and/or kV according to patient size and/or use of iterative reconstruction technique. COMPARISON:  05/23/2022 FINDINGS: Lower chest: Moderate bilateral pleural effusions with associated compressive atelectasis in the bilateral lower lobes. Additional patchy anterior right lower lobe opacity, possibly reflecting pneumonia, less likely atelectasis. Mild  lingular and left lower lobe atelectasis. Hepatobiliary: Unenhanced liver is unremarkable. Gallbladder is unremarkable. No intrahepatic or extrahepatic duct dilatation. Pancreas: Within normal limits. Spleen: Within normal limits. Adrenals/Urinary Tract: Adrenal glands are within normal limits. Kidneys are within normal limits. No renal, ureteral, or bladder calculi. No hydronephrosis. Mildly thick-walled bladder. Stomach/Bowel: Stomach is within normal limits. No evidence of bowel obstruction. Normal  appendix (series 3/image 66). No colonic wall thickening or inflammatory changes. Vascular/Lymphatic: No evidence of abdominal aortic aneurysm. No suspicious abdominopelvic lymphadenopathy. Reproductive: Uterus is within normal limits. Bilateral ovaries are within normal limits. Other: Trace pelvic ascites. Musculoskeletal: Visualized osseous structures are within normal limits. IMPRESSION: No evidence of bowel obstruction. Normal appendix. No CT findings to account for the patient's abdominal pain. Mildly thick-walled bladder, correlate for cystitis. Moderate bilateral pleural effusions with associated compressive atelectasis in the bilateral lower lobes. Additional patchy anterior right lower lobe opacity, possibly reflecting pneumonia, less likely atelectasis. Electronically Signed   By: Julian Hy M.D.   On: 10/05/2022 01:13   DG Chest 2 View  Result Date: 10/04/2022 CLINICAL DATA:  Shortness of breath EXAM: CHEST - 2 VIEW COMPARISON:  CXR 09/01/22 FINDINGS: Left-sided single lead cardiac device in place with likely unchanged lead positioning when accounting for differences in positioning. No pleural effusion. No pneumothorax. Unchanged slightly enlarged cardiac contours. Unchanged mediastinal contours. Compared to prior exam there are new bilateral perihilar and bibasilar airspace opacities, which could represent atypical infection or pulmonary edema. No radiographically apparent displaced rib fractures. Vertebral body heights are maintained. IMPRESSION: Compared to prior exam there are new bilateral perihilar and bibasilar airspace opacities, which could represent atypical infection or pulmonary edema. Electronically Signed   By: Marin Roberts M.D.   On: 10/04/2022 10:20   CUP PACEART INCLINIC DEVICE CHECK  Result Date: 09/13/2022 ICD check in clinic. Normal device function. Thresholds and sensing consistent with previous device measurements. Impedance trends stable over time. No mode switches. No  ventricular arrhythmias. Histogram distribution appropriate for patient and level of  activity. No changes made this session. Device programmed at appropriate safety margins. Device programmed to optimize intrinsic conduction. Estimated longevity 10 yr, 84mo Pt enrolled in remote follow-up.     Labs:  Recent Labs    10/06/22 0730 10/07/22 0256 10/07/22 0754  WBC 6.7 5.7 5.1  HGB 7.4* 7.2* 7.1*  HCT 23.4* 21.4* 21.2*  PLT 207 208 193   Recent Labs    10/05/22 0242 10/06/22 0730 10/07/22 0256  NA 140 137 133*  K 3.8 4.3 3.8  CL 109 106 104  CO2 22 16* 21*  GLUCOSE 184* 227* 133*  BUN 39* 48* 57*  CREATININE 4.12* 4.73* 5.31*  CALCIUM 8.0* 8.0* 7.8*   Recent Labs    10/07/22 0256  PROT 5.3*  ALBUMIN 2.3*  AST 125*  ALT 96*  ALKPHOS 190*  BILITOT 0.6   Recent Labs    10/07/22 0248  HEPBSAG NON REACTIVE   No results for input(s): "LABPROT", "INR" in the last 72 hours.  Past Medical History:  Diagnosis Date   Anemia    Anxiety    Arthritis    Asthma    [place]    Chronic kidney disease    Depression    Diabetic ulcer of left foot ([place] 05/12/2013   Gastroparesis    Generalized abdominal pain    History of chicken pox    Loss of weight 12/02/2019   Migraines    Mixed hyperlipidemia 09/05/2022  Mood disorder ([place])    anxiety   Myocardial infarction ([place])    Prurigo nodularis    with diabetic dermopathy   Type 1 diabetes, uncontrolled, with neuropathy    [place]   Ulcers of both lower legs ([place]) 02/20/2014    Past Surgical History:  Procedure Laterality Date   BIOPSY  08/11/2020   Procedure: BIOPSY;  Surgeon: Ladene Artist, MD;  Location: [facility];  Service: Endoscopy;;   BIOPSY  04/02/2022   Procedure: BIOPSY;  Surgeon: Jackquline Denmark, MD;  Location: [facility];  Service: Gastroenterology;;   ESOPHAGOGASTRODUODENOSCOPY (EGD) WITH PROPOFOL N/A 08/11/2020   Procedure: ESOPHAGOGASTRODUODENOSCOPY (EGD) WITH PROPOFOL;   Surgeon: Ladene Artist, MD;  Location: [facility];  Service: Endoscopy;  Laterality: N/A;   ESOPHAGOGASTRODUODENOSCOPY (EGD) WITH PROPOFOL N/A 04/02/2022   Procedure: ESOPHAGOGASTRODUODENOSCOPY (EGD) WITH PROPOFOL;  Surgeon: Jackquline Denmark, MD;  Location: [facility];  Service: Gastroenterology;  Laterality: N/A;   ESOPHAGOGASTRODUODENOSCOPY (EGD) WITH PROPOFOL N/A 05/23/2022   Procedure: ESOPHAGOGASTRODUODENOSCOPY (EGD) WITH PROPOFOL;  Surgeon: Lucilla Lame, MD;  Location: [facility];  Service: Endoscopy;  Laterality: N/A;   ICD IMPLANT N/A 04/09/2022   Procedure: ICD IMPLANT;  Surgeon: Constance Haw, MD;  Location: [facility];  Service: Cardiovascular;  Laterality: N/A;   treadmill stress test  01/2013   WNL, low risk study    Family History  Problem Relation Age of Onset   Diabetes Mother        type 2   Hypertension Mother    Thyroid disease Mother    Bipolar disorder Mother    Heart disease Mother    Calcium disorder Mother    Cancer Maternal Grandmother        Breast, stomach   Cancer Paternal Grandmother        stomach, lung (smoker)   Diabetes Paternal Grandmother    Diabetes Paternal Grandfather    Heart failure Sister    Diabetes Sister    Stroke Maternal Aunt    Cancer Maternal Uncle        prostate   CAD Maternal Aunt        stents   Cancer Maternal Aunt 64       ovarian    Prior to Admission medications   Medication Sig Start Date End Date Taking? Authorizing Provider  cloNIDine (CATAPRES - DOSED IN MG/24 HR) 0.1 mg/24hr patch 0.1 mg once a week. 09/27/22  Yes [provider]  acetaminophen (TYLENOL) 500 MG tablet as needed for moderate pain or mild pain.    [provider]  albuterol (PROAIR HFA) 108 (90 Base) MCG/ACT inhaler Inhale 2 puffs into the lungs every 6 (six) hours as needed for wheezing or shortness of breath. 07/03/20   [name], PA-C  Ensure Max Protein (ENSURE MAX PROTEIN) LIQD Take 330 mLs (11 oz  total) by mouth daily. 04/30/22   Hongalgi, [name], MD  hydrALAZINE (APRESOLINE) 50 MG tablet Take 1 tablet (50 mg total) by mouth 3 (three) times daily. 07/17/22   Domenic Polite, MD  hydrOXYzine (ATARAX) 50 MG tablet Take 50 mg by mouth in the morning, at noon, and at bedtime.    [provider]  insulin aspart (NOVOLOG) 100 UNIT/ML injection Inject 150 Units into the skin See admin instructions. Patient uses this with the [place]. Units vary according to blood sugar. Loads 150 units every 3 days.    [provider]  LORazepam (ATIVAN) 0.5 MG tablet Take 1 tablet (0.5 mg  total) by mouth every 8 (eight) hours as needed for anxiety (Refractory nausea and vomiting). 07/17/22   Domenic Polite, MD  metoCLOPramide (REGLAN) 10 MG tablet Take 1 tablet (10 mg total) by mouth 3 (three) times daily before meals. 07/17/22   Domenic Polite, MD  metoprolol succinate (TOPROL-XL) 50 MG 24 hr tablet Take 1 tablet (50 mg total) by mouth daily. Take with or immediately following a meal. 09/05/22   Chandrasekhar, Mahesh A, MD  Multiple Vitamin (MULTIVITAMIN WITH MINERALS) TABS tablet Take 1 tablet by mouth daily. 04/30/22   Hongalgi, [name], MD  nitroGLYCERIN (NITROSTAT) 0.4 MG SL tablet Place 1 tablet (0.4 mg total) under the tongue every 5 (five) minutes as needed for chest pain. 09/05/22 12/04/22  Werner Lean, MD  oxyCODONE-acetaminophen (PERCOCET) 5-325 MG tablet Take 1 tablet by mouth every 6 (six) hours as needed for severe pain. 10/01/22   Rosetta Posner, MD  pantoprazole (PROTONIX) 40 MG tablet Take 1 tablet (40 mg total) by mouth 2 (two) times daily before a meal. 05/24/22 09/08/22  Rai, Ripudeep K, MD  polyethylene glycol (MIRALAX / GLYCOLAX) 17 g packet Take 17 g by mouth daily as needed for moderate constipation.    [provider]  pravastatin (PRAVACHOL) 40 MG tablet Take 1 tablet (40 mg total) by mouth every evening. 09/05/22   Chandrasekhar, Lyda Kalata A, MD  pregabalin (LYRICA)  50 MG capsule Take 50 mg by mouth 3 (three) times daily as needed (neuropathy). 12/05/21   [provider]  prochlorperazine (COMPAZINE) 10 MG tablet Take 1 tablet (10 mg total) by mouth every 6 (six) hours as needed for nausea or vomiting. 05/24/22   Rai, [name], MD  promethazine (PHENERGAN) 12.5 MG tablet Take 12.5 mg by mouth 2 (two) times daily as needed for nausea or vomiting.    [provider]  promethazine (PHENERGAN) 25 MG suppository Unwrap and place 1 suppository (25 mg total) rectally every 6 (six) hours as needed for nausea or vomiting. 07/17/22   Domenic Polite, MD  scopolamine (TRANSDERM-SCOP) 1 MG/3DAYS Place 1 patch (1.5 mg total) onto the skin every 3 (three) days. 05/26/22   Rai, Ripudeep K, MD  tobramycin (TOBREX) 0.3 % ophthalmic solution Place 1-2 drops into both eyes See admin instructions. Instill 1-2 drops into both eyes 3 times daily the day before, day of, and the day after every 6 weeks eye injections per patient 11/17/20   [provider]  torsemide (DEMADEX) 20 MG tablet TAKE THREE TABLETS BY MOUTH DAILY 08/30/22   Ninfa Meeker, Amy D, NP    Current Facility-Administered Medications  Medication Dose Route Frequency Provider Last Rate Last Admin   acetaminophen (TYLENOL) tablet 650 mg  650 mg Oral Q6H PRN Lucienne Minks, MD   650 mg at 10/06/22 1115   albuterol (PROVENTIL) (2.5 MG/3ML) 0.083% nebulizer solution 2.5 mg  2.5 mg Inhalation Q6H PRN Thurnell Lose, MD       alteplase (CATHFLO ACTIVASE) injection 2 mg  2 mg Intracatheter Once PRN Madelon Lips, MD       anticoagulant sodium citrate solution 5 mL  5 mL Intracatheter PRN Madelon Lips, MD       [START ON 10/11/2022] aspirin EC tablet 81 mg  81 mg Oral Daily Thurnell Lose, MD       Chlorhexidine Gluconate Cloth 2 % PADS 6 each  6 each Topical Q0600 Madelon Lips, MD   6 each at 10/07/22 1018   ferric gluconate (FERRLECIT) 250  mg in sodium chloride 0.9 % 250 mL IVPB  250 mg  Intravenous Q M,W,F-HD Madelon Lips, MD       furosemide (LASIX) injection 80 mg  80 mg Intravenous BID Rosita Fire, MD   80 mg at 10/07/22 0842   guaiFENesin (ROBITUSSIN) 100 MG/5ML liquid 10 mL  10 mL Oral Q6H Lucienne Minks, MD   10 mL at 10/07/22 0532   heparin injection 1,000 Units  1,000 Units Intracatheter PRN Madelon Lips, MD       hydrALAZINE (APRESOLINE) injection 10 mg  10 mg Intravenous Q6H PRN Thurnell Lose, MD       hydrALAZINE (APRESOLINE) tablet 75 mg  75 mg Oral Q8H Williams, Evan, PA-C       insulin aspart (novoLOG) injection 0-5 Units  0-5 Units Subcutaneous QHS Lucienne Minks, MD   4 Units at 10/05/22 0005   insulin aspart (novoLOG) injection 0-9 Units  0-9 Units Subcutaneous TID WC Lucienne Minks, MD   1 Units at 10/07/22 0843   insulin glargine-yfgn (SEMGLEE) injection 10 Units  10 Units Subcutaneous QHS Lucienne Minks, MD   10 Units at 10/06/22 2049   isosorbide dinitrate (ISORDIL) tablet 20 mg  20 mg Oral TID Chandrasekhar, Mahesh A, MD       levalbuterol (XOPENEX) nebulizer solution 0.63 mg  0.63 mg Nebulization BID Lucienne Minks, MD   0.63 mg at 10/07/22 0730   LORazepam (ATIVAN) injection 0.5 mg  0.5 mg Intravenous Q6H PRN Lucienne Minks, MD   0.5 mg at 10/06/22 1743   metoCLOPramide (REGLAN) injection 5 mg  5 mg Intravenous Q6H Lucienne Minks, MD   5 mg at 10/07/22 0532   metoprolol succinate (TOPROL-XL) 24 hr tablet 50 mg  50 mg Oral Daily Lucienne Minks, MD   50 mg at 10/06/22 0835   nitroGLYCERIN (NITROSTAT) SL tablet 0.4 mg  0.4 mg Sublingual Q5 min PRN Thurnell Lose, MD       oxyCODONE-acetaminophen (PERCOCET/ROXICET) 5-325 MG per tablet 1 tablet  1 tablet Oral Q6H PRN Thurnell Lose, MD       pantoprazole (PROTONIX) injection 40 mg  40 mg Intravenous Q12H Thurnell Lose, MD   40 mg at 10/07/22 0843   pravastatin (PRAVACHOL) tablet 40 mg  40 mg Oral q1800 Lucienne Minks, MD   40 mg at 10/06/22 1702   prochlorperazine (COMPAZINE) injection 5  mg  5 mg Intravenous Q6H PRN Lucienne Minks, MD   5 mg at 10/07/22 0334   scopolamine (TRANSDERM-SCOP) 1 MG/3DAYS 1.5 mg  1 patch Transdermal Q72H Lucienne Minks, MD   1.5 mg at 10/04/22 2040   sodium bicarbonate tablet 1,300 mg  1,300 mg Oral BID Rosita Fire, MD   1,300 mg at 10/07/22 0841   tobramycin (TOBREX) 0.3 % ophthalmic solution 1-2 drop  1-2 drop Both Eyes See admin instructions Thurnell Lose, MD       vancomycin (VANCOCIN) IVPB 1000 mg/200 mL premix  1,000 mg Intravenous Once Jacqualine Mau, NP        Allergies as of 10/04/2022 - Review Complete 10/04/2022  Allergen Reaction Noted   Atorvastatin Other (See Comments) 04/23/2021   Dulaglutide Nausea And Vomiting and Other (See Comments) 05/23/2020   Sertraline  09/05/2022   Zofran [ondansetron] Nausea And Vomiting and Other (See Comments) 04/24/2022   Amoxicillin Itching and Rash 11/25/2017   Lantus [insulin glargine] Itching and Rash 11/25/2017   Miconazole nitrate Nausea And Vomiting and Rash 05/23/2020  Social History   Socioeconomic History   Marital status: Divorced    Spouse name: Not on file   Number of children: 2   Years of education: Not on file   Highest education level: Not on file  Occupational History   Occupation: Freight forwarder  Tobacco Use   Smoking status: Never   Smokeless tobacco: Never  Vaping Use   Vaping Use: Never used  Substance and Sexual Activity   Alcohol use: Not Currently   Drug use: No   Sexual activity: Yes    Partners: Male    Birth control/protection: None  Other Topics Concern   Not on file  Social History Narrative   Caffeine use: daily   Occupation: cleans houses   Regular exercise: yes, active 5x/wk   Diet: good water, fruits/vegetables daily   Social Determinants of Health   Financial Resource Strain: Low Risk  (07/15/2022)   Overall Financial Resource Strain (CARDIA)    Difficulty of Paying Living Expenses: Not very hard  Food Insecurity: No Food  Insecurity (10/04/2022)   Hunger Vital Sign    Worried About Running Out of Food in the Last Year: Never true    [place] in the Last Year: Never true  Transportation Needs: No Transportation Needs (10/04/2022)   PRAPARE - Hydrologist (Medical): No    Lack of Transportation (Non-Medical): No  Physical Activity: Not on file  Stress: Not on file  Social Connections: Not on file  Intimate Partner Violence: Not At Risk (10/04/2022)   Humiliation, Afraid, Rape, and Kick questionnaire    Fear of Current or Ex-Partner: No    Emotionally Abused: No    Physically Abused: No    Sexually Abused: No    Review of Systems: All systems reviewed and negative except where noted in HPI.  Physical Exam: Vital signs in last 24 hours: Temp:  [97.9 F (36.6 C)-101.8 F (38.8 C)] 100.4 F (38 C) (03/04 1125) Pulse Rate:  [85-130] 96 (03/04 1125) Resp:  [20-39] 29 (03/04 1125) BP: (110-176)/(50-85) 164/85 (03/04 1125) SpO2:  [97 %-100 %] 97 % (03/04 1125) Last BM Date : 10/07/22  General:  Alert ***female in NAD Psych:  Pleasant, cooperative. Normal mood and affect Eyes: Pupils equal Ears:  Normal auditory acuity Nose: No deformity, discharge or lesions Neck:  Supple, no masses felt Lungs:  Clear to auscultation.  Heart:  Regular rate, regular rhythm.  Abdomen:  Soft, nondistended, nontender, active bowel sounds, no masses felt Rectal :  Deferred Msk: Symmetrical without gross deformities.  Neurologic:  Alert, oriented, grossly normal neurologically Extremities : No edema Skin:  Intact without significant lesions.    Intake/Output from previous day: 03/03 0701 - 03/04 0700 In: 120 [P.O.:120] Out: 800 [Urine:800] Intake/Output this shift:  No intake/output data recorded.    Principal Problem:   Hematemesis Active Problems:   Pneumonia due to respiratory syncytial virus (RSV)   NSVT (nonsustained ventricular tachycardia) (HCC)   Chronic combined  systolic and diastolic heart failure ([place])    Tye Savoy, NP-C @  10/07/2022, 1:15 PM

## 2022-10-07 NOTE — Progress Notes (Signed)
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 Recent Left AVF placed by Dr. Donnetta Hutching.  Recent progressive CKD in the setting of multiple hospitalization, ongoing gastroparesis flare etc.  She is on antibiotics for pneumonia.   - having some uremic symptoms- nausea and shakiness - starting dialysis today - NPO - TDC ordered- appreciate IR - HD #1 ordered today after TDC and also Hep B - appreciate VVS eval of thrombosed AVF   # Possible pneumonia/acute hypoxic respiratory failure/RSV+: On ceftriaxone and Doxy by primary team.  Currently on oxygen.  Hopefully IV Lasix will help for volume management.   # Acute on chronic diastolic CHF with fluid overload/pleural effusion: IV diuretics.  Echo with EF of 40 to 45%, mild concentric LVH.  Noted cardiology is consulted.   # Metabolic acidosis: Will add sodium bicarbonate.   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  Corrected calcium and phosphorus level acceptable for CKD.   # Anemia of CKD: I will check iron studies.  She receives erythropoietin as outpatient.   # Hypertension/volume: Currently on cardiac medication including metoprolol, Imdur, hydralazine.  Diuretics as above.  Blood pressure acceptable.    Subjective:    Seen in room.  Has persistent nausea, a little shaky this AM too.  NPO.  Discussed starting dialysis- she is understandably disappointed but is OK with proceeding   Objective:   BP (!) 149/70   Pulse 95   Temp 97.9 F (36.6 C) (Axillary)   Resp 20   Ht 6' (1.829 m)   Wt 81.4 kg   LMP 11/17/2021 (Approximate)   SpO2 98%   BMI 24.34 kg/m   Physical Exam: [id number] uncomfortable this AM CVS: RRR Resp: bilateral wheezing Abd: soft Ext: 1+ Le edema ACCESS: L AVF thrombosed  Labs: BMET Recent Labs  Lab 10/01/22 1019 10/04/22 1000 10/05/22 0242 10/06/22 0730 10/07/22 0256  NA 137 140  140 137 133*  K 4.0 4.1 3.8 4.3 3.8  CL 104 107 109 106 104  CO2 21* 23 22 16* 21*  GLUCOSE 245* 288* 184* 227* 133*  BUN 51* 37* 39* 48* 57*  CREATININE 4.11* 3.61* 4.12* 4.73* 5.31*  CALCIUM 8.7* 8.9 8.0* 8.0* 7.8*  PHOS  --   --  3.8 5.4* 5.9*   CBC Recent Labs  Lab 10/04/22 1000 10/05/22 0242 10/06/22 0730 10/07/22 0256 10/07/22 0754  WBC 7.2 7.1 6.7 5.7 5.1  NEUTROABS 6.5  --   --   --   --   HGB 9.4* 7.7* 7.4* 7.2* 7.1*  HCT 28.6* 23.1* 23.4* 21.4* 21.2*  MCV 86.9 88.5 89.7 87.7 87.6  PLT 288 236 207 208 193      Medications:     sodium chloride   Intravenous Once   [START ON 10/11/2022] aspirin EC  81 mg Oral Daily   Chlorhexidine Gluconate Cloth  6 each Topical Q0600   furosemide  80 mg Intravenous BID   guaiFENesin  10 mL Oral Q6H   hydrALAZINE  50 mg Oral Q8H   insulin aspart  0-5 Units Subcutaneous QHS   insulin aspart  0-9 Units Subcutaneous TID WC   insulin glargine-yfgn  10 Units Subcutaneous QHS   isosorbide dinitrate  10 mg Oral TID   levalbuterol  0.63 mg Nebulization BID   metoCLOPramide (REGLAN) injection  5 mg Intravenous Q6H   metoprolol succinate  50 mg  Oral Daily   pantoprazole (PROTONIX) IV  40 mg Intravenous Q12H   pravastatin  40 mg Oral q1800   scopolamine  1 patch Transdermal Q72H   sodium bicarbonate  1,300 mg Oral BID   tobramycin  1-2 drop Both Eyes See admin instructions     Madelon Lips MD 10/07/2022, 9:13 AM

## 2022-10-07 NOTE — Plan of Care (Signed)

## 2022-10-07 NOTE — Progress Notes (Signed)
PROGRESS NOTE                                                                                                                                                                                                             Patient Demographics:    [name], is a 52 y.o. female, DOB - 1971/01/31, [id number]  Outpatient Primary MD for the patient is [name], [place]    LOS - 3  Admit date - 10/04/2022    Chief Complaint  Patient presents with   Abdominal Pain   AICD Problem       Brief Narrative (HPI from H&P)   50/F with history of type 1 diabetes mellitus, diabetic gastroparesis, recurrent admissions for nausea and vomiting, CKD 4 now progressing to stage V, cardiac arrest 04/05/2022 (witnessed cardiac arrest at a restaurant, able AED advised shock, echo with preserved EF, mildly elevated PASP, underwent single-chamber ICD on 04/09/22, since then has been admitted multiple times with nausea and vomiting due to diabetic gastroparesis presented this time with abdominal pain nausea vomiting workup suggestive of RSV pneumonitis likely causing exacerbation of diabetic gastroparesis due to viral infection, was admitted to the hospital for further care transferred to my service on 10/07/2022 on day 4 of her hospital stay.   Subjective:    [name] today has, No headache, No chest pain, No abdominal pain - +ve  Nausea, No new weakness tingling or numbness, no SOB   Assessment  & Plan :    Diabetic gastroparesis acute on chronic exacerbation due to RSV viral infection causing recurrent and persistent nausea vomiting.  Continue supportive care, abdominal exam is benign, although she is on Reglan 3 times a day continues to have nausea vomiting, continue supportive care and consult GI, abdominal exam KUB stable.  RSV viral pneumonitis.  Currently not hypoxic.  No acute issues, stop all antibiotics and monitor.  Antibiotics  could be worsening nausea vomiting.  Chronic combined diastolic and systolic heart failure history of V. tach with cardiac arrest requiring AICD placement.  EF 40 to 45%. Cardiology is following, currently compensated, continue combination of beta-blocker, Isordil and hydralazine, no ACE/ARB due to underlying renal insufficiency, mild troponin bump in non-ACS pattern due to underlying CHF and stress of #1 and 2 above, continue monitoring with supportive care.  Hypertension.  Hypertensive emergency upon  admission, currently blood pressure stable on present regimen monitor and adjust.  AKI on CKD 5.  Nephrology following, may require dialysis soon, she does have left upper extremity AV fistula which unfortunately has clotted, VVS following.  May require HD catheter in the short-term.  Left arm AV fistula.  Clotted off.  VVS following.  Acute on chronic anemia.  Does have underlying anemia of chronic disease with acute drop due to multiple blood draws and acute illness, also could have some mild upper GI bleed due to persistent nausea vomiting causing Mallory-Weiss tear, IV PPI, hold heparin and aspirin for few days, transfuse 1 unit due to underlying CHF on 10/07/2022.  GI consulted as well.  Monitor.  Dyslipidemia.  On statin.    DM type II.  On sliding scale and Lantus.  Monitor.  Lab Results  Component Value Date   HGBA1C 6.4 (H) 07/12/2022   CBG (last 3)  Recent Labs    10/06/22 1120 10/06/22 1645 10/06/22 2053  GLUCAP 131* 135* 147*         Condition -  Guarded  Family Communication  :  None  Code Status :  Full  Consults  :  VVS, Renal, Cards, GI  PUD Prophylaxis : PPI   Procedures  :     TTE -  1. Left ventricular ejection fraction, by estimation, is 40 to 45%. The left ventricle has mildly decreased function. The left ventricle demonstrates global hypokinesis. There is mild concentric left ventricular hypertrophy. Left ventricular diastolic parameters are consistent  with Grade III diastolic dysfunction (restrictive). Elevated left atrial pressure.  2. Right ventricular systolic function is mildly reduced. The right ventricular size is moderately enlarged. There is moderately elevated pulmonary artery systolic pressure.  3. Left atrial size was moderately dilated.  4. Right atrial size was moderately dilated.  5. The mitral valve is normal in structure. No evidence of mitral valve regurgitation. No evidence of mitral stenosis.  6. Tricuspid valve regurgitation is moderate to severe.  7. The aortic valve is tricuspid. Aortic valve regurgitation is not visualized. No aortic stenosis is present.  8. The inferior vena cava is dilated in size with <50% respiratory variability, suggesting right atrial pressure of 15 mmHg. Comparison(s): Prior images reviewed side by side. The left ventricular function is significantly worse. The right ventricular systolic function is worse  L Arm [place] -  Brachiobasilic AVF appears thrombosed  CT -  No evidence of bowel obstruction. Normal appendix. No CT findings to account for the patient's abdominal pain. Mildly thick-walled bladder, correlate for cystitis. Moderate bilateral pleural effusions with associated compressive atelectasis in the bilateral lower lobes. Additional patchy anterior right lower lobe opacity, possibly reflecting pneumonia, less likely atelectasis.      Disposition Plan  :    Status is: Inpatient   DVT Prophylaxis  :    Place and maintain sequential compression device Start: 10/07/22 0554  Lab Results  Component Value Date   PLT 208 10/07/2022    Diet :  Diet Order             Diet NPO time specified Except for: Sips with Meds  Diet effective midnight                    Inpatient Medications  Scheduled Meds:  sodium chloride   Intravenous Once   [START ON 10/11/2022] aspirin EC  81 mg Oral Daily   furosemide  80 mg Intravenous BID   guaiFENesin  10  mL Oral Q6H   hydrALAZINE  50 mg Oral Q8H    insulin aspart  0-5 Units Subcutaneous QHS   insulin aspart  0-9 Units Subcutaneous TID WC   insulin glargine-yfgn  10 Units Subcutaneous QHS   isosorbide dinitrate  10 mg Oral TID   levalbuterol  0.63 mg Nebulization BID   metoCLOPramide (REGLAN) injection  5 mg Intravenous Q6H   metoprolol succinate  50 mg Oral Daily   pantoprazole (PROTONIX) IV  40 mg Intravenous Q12H   pravastatin  40 mg Oral q1800   scopolamine  1 patch Transdermal Q72H   sodium bicarbonate  1,300 mg Oral BID   tobramycin  1-2 drop Both Eyes See admin instructions   Continuous Infusions: PRN Meds:.acetaminophen, albuterol, hydrALAZINE, LORazepam, nitroGLYCERIN, oxyCODONE-acetaminophen, prochlorperazine    Objective:   Vitals:   10/06/22 2300 10/07/22 0359 10/07/22 0533 10/07/22 0731  BP: (!) 146/74 (!) 140/60 (!) 176/70   Pulse: 92 (!) 130    Resp: (!) 29 20    Temp: 97.9 F (36.6 C) 97.9 F (36.6 C)    TempSrc: Oral Axillary    SpO2: 97%   98%  Weight:      Height:        Wt Readings from Last 3 Encounters:  10/04/22 81.4 kg  10/01/22 80.3 kg  09/13/22 80.3 kg     Intake/Output Summary (Last 24 hours) at 10/07/2022 0743 Last data filed at 10/07/2022 0300 Gross per 24 hour  Intake 120 ml  Output 800 ml  Net -680 ml     Physical Exam  Awake Alert, No new F.N deficits, Normal affect [place].AT,PERRAL Supple Neck, No JVD,   Symmetrical Chest wall movement, Good air movement bilaterally, CTAB RRR,No Gallops,Rubs or new Murmurs,  +ve B.Sounds, Abd Soft, No tenderness,   No Cyanosis, Clubbing or edema        Data Review:    Recent Labs  Lab 10/01/22 1019 10/04/22 1000 10/05/22 0242 10/06/22 0730 10/07/22 0256  WBC  --  7.2 7.1 6.7 5.7  HGB 9.3* 9.4* 7.7* 7.4* 7.2*  HCT 29.1* 28.6* 23.1* 23.4* 21.4*  PLT  --  288 236 207 208  MCV  --  86.9 88.5 89.7 87.7  MCH  --  28.6 29.5 28.4 29.5  MCHC  --  32.9 33.3 31.6 33.6  RDW  --  15.4 15.6* 15.7* 15.7*  LYMPHSABS  --  0.5*  --   --   --    MONOABS  --  0.1  --   --   --   EOSABS  --  0.0  --   --   --   BASOSABS  --  0.0  --   --   --     Recent Labs  Lab 10/01/22 1019 10/04/22 1000 10/05/22 0242 10/06/22 0730 10/07/22 0256  NA 137 140 140 137 133*  K 4.0 4.1 3.8 4.3 3.8  CL 104 107 109 106 104  CO2 21* 23 22 16* 21*  ANIONGAP '12 10 9 15 8  '$ GLUCOSE 245* 288* 184* 227* 133*  BUN 51* 37* 39* 48* 57*  CREATININE 4.11* 3.61* 4.12* 4.73* 5.31*  AST  --  9* 14* 42* 125*  ALT  --  14 11 36 96*  ALKPHOS  --  83 63 136* 190*  BILITOT  --  1.0 0.9 0.7 0.6  ALBUMIN  --  3.6 2.1* 2.2* 2.3*  CRP  --   --  2.8* 9.8* 8.8*  DDIMER  --  6.70*  --   --   --   LATICACIDVEN  --  1.2  --   --   --   BNP  --  1,496.7*  --   --   --   MG  --   --  1.8 1.7 1.8  CALCIUM 8.7* 8.9 8.0* 8.0* 7.8*      Recent Labs  Lab 10/01/22 1019 10/04/22 1000 10/05/22 0242 10/06/22 0730 10/07/22 0256  CRP  --   --  2.8* 9.8* 8.8*  DDIMER  --  6.70*  --   --   --   LATICACIDVEN  --  1.2  --   --   --   BNP  --  1,496.7*  --   --   --   MG  --   --  1.8 1.7 1.8  CALCIUM 8.7* 8.9 8.0* 8.0* 7.8*    Recent Labs  Lab 10/01/22 1019 10/04/22 1000 10/05/22 0242 10/06/22 0730 10/07/22 0256  WBC  --  7.2 7.1 6.7 5.7  PLT  --  288 236 207 208  CRP  --   --  2.8* 9.8* 8.8*  DDIMER  --  6.70*  --   --   --   LATICACIDVEN  --  1.2  --   --   --   CREATININE 4.11* 3.61* 4.12* 4.73* 5.31*    ------------------------------------------------------------------------------------------------------------------ No results for input(s): "CHOL", "HDL", "LDLCALC", "TRIG", "CHOLHDL", "LDLDIRECT" in the last 72 hours.  Lab Results  Component Value Date   HGBA1C 6.4 (H) 07/12/2022   Radiology Reports DG Chest Port 1 View  Result Date: 10/07/2022 CLINICAL DATA:  Shortness of breath EXAM: PORTABLE CHEST 1 VIEW COMPARISON:  Three days ago FINDINGS: Cardiomegaly and vascular pedicle widening with diffuse interstitial opacity. No air bronchogram. No  pneumothorax. ICD lead into the right ventricle. Artifact from EKG leads. IMPRESSION: Suspect mild CHF. Electronically Signed   By: Jorje Guild M.D.   On: 10/07/2022 06:37   DG Abd Portable 1V  Result Date: 10/07/2022 CLINICAL DATA:  Nausea and vomiting EXAM: PORTABLE ABDOMEN - 1 VIEW COMPARISON:  Abdominal CT from 2 days ago FINDINGS: The bowel gas pattern is normal. No radio-opaque calculi or other significant radiographic abnormality are seen. IMPRESSION: Normal bowel gas pattern. Electronically Signed   By: Jorje Guild M.D.   On: 10/07/2022 06:36   VAS US DUPLEX DIALYSIS ACCESS (AVF, AVG)  Result Date: 10/06/2022 DIALYSIS ACCESS Patient Name:  SARAPHINA AGENT  Date of Exam:   10/06/2022 Medical Rec #: [id number]       Accession #:    [id number] Date of Birth: 22-Feb-1971      Patient Gender: F Patient Age:   70 years Exam Location:  [facility] Procedure:      VAS US DUPLEX DIALYSIS ACCESS (AVF, AVG) Referring Phys: Lawson Radar --------------------------------------------------------------------------------  Reason for Exam: No palpable thrill for AVF/AVG. Access Site: Left Upper Extremity. Access Type: Brachio-basilic, status post first stage 10/01/22. Comparison Study: No prior study Performing Technologist: Sharion Dove RVS  Examination Guidelines: A complete evaluation includes B-mode imaging, spectral Doppler, color Doppler, and power Doppler as needed of all accessible portions of each vessel. Unilateral testing is considered an integral part of a complete examination. Limited examinations for reoccurring indications may be performed as noted.    Summary: Brachiobasilic AVF appears thrombosed  *See table(s) above for measurements and observations.  Diagnosing physician: Monica Martinez MD Electronically signed by Monica Martinez MD  on 10/06/2022 at 1:02:35 PM.   --------------------------------------------------------------------------------   Final    ECHOCARDIOGRAM  COMPLETE  Result Date: 10/05/2022    ECHOCARDIOGRAM REPORT   Patient Name:   MANUELITA PORTEE Date of Exam: 10/05/2022 Medical Rec #:  [id number]      Height:       72.0 in Accession #:    [id number]     Weight:       179.5 lb Date of Birth:  March 16, 1971     BSA:          2.035 m Patient Age:    47 years       BP:           120/72 mmHg Patient Gender: F              HR:           94 bpm. Exam Location:  Inpatient Procedure: 2D Echo, Cardiac Doppler and Color Doppler Indications:    Chest Pain R07.9  History:        Patient has no prior history of Echocardiogram examinations.                 CHF, CAD and Previous Myocardial Infarction; Risk                 Factors:Diabetes and Hypertension.  Sonographer:    Luane School RDCS Referring Phys: [id number] [place]  1. Left ventricular ejection fraction, by estimation, is 40 to 45%. The left ventricle has mildly decreased function. The left ventricle demonstrates global hypokinesis. There is mild concentric left ventricular hypertrophy. Left ventricular diastolic parameters are consistent with Grade III diastolic dysfunction (restrictive). Elevated left atrial pressure.  2. Right ventricular systolic function is mildly reduced. The right ventricular size is moderately enlarged. There is moderately elevated pulmonary artery systolic pressure.  3. Left atrial size was moderately dilated.  4. Right atrial size was moderately dilated.  5. The mitral valve is normal in structure. No evidence of mitral valve regurgitation. No evidence of mitral stenosis.  6. Tricuspid valve regurgitation is moderate to severe.  7. The aortic valve is tricuspid. Aortic valve regurgitation is not visualized. No aortic stenosis is present.  8. The inferior vena cava is dilated in size with <50% respiratory variability, suggesting right atrial pressure of 15 mmHg. Comparison(s): Prior images reviewed side by side. The left ventricular function is significantly worse. The right ventricular  systolic function is worse. TR severity and PA hypertension are also worse, with evidence of hypervolemia. FINDINGS  Left Ventricle: Left ventricular ejection fraction, by estimation, is 40 to 45%. The left ventricle has mildly decreased function. The left ventricle demonstrates global hypokinesis. The left ventricular internal cavity size was normal in size. There is  mild concentric left ventricular hypertrophy. Left ventricular diastolic parameters are consistent with Grade III diastolic dysfunction (restrictive). Elevated left atrial pressure. Right Ventricle: The right ventricular size is moderately enlarged. No increase in right ventricular wall thickness. Right ventricular systolic function is mildly reduced. There is moderately elevated pulmonary artery systolic pressure. The tricuspid regurgitant velocity is 3.05 m/s, and with an assumed right atrial pressure of 15 mmHg, the estimated right ventricular systolic pressure is [id number]. Left Atrium: Left atrial size was moderately dilated. Right Atrium: Right atrial size was moderately dilated. Pericardium: Trivial pericardial effusion is present. Mitral Valve: The mitral valve is normal in structure. There is mild thickening of the mitral valve leaflet(s). Mild mitral annular calcification. No evidence of mitral  valve regurgitation. No evidence of mitral valve stenosis. Tricuspid Valve: The tricuspid valve is normal in structure. Tricuspid valve regurgitation is moderate to severe. Aortic Valve: The aortic valve is tricuspid. Aortic valve regurgitation is not visualized. No aortic stenosis is present. Pulmonic Valve: The pulmonic valve was normal in structure. Pulmonic valve regurgitation is trivial. Aorta: The aortic root and ascending aorta are structurally normal, with no evidence of dilitation. Venous: The inferior vena cava is dilated in size with less than 50% respiratory variability, suggesting right atrial pressure of 15 mmHg. IAS/Shunts: No atrial  level shunt detected by color flow Doppler. Additional Comments: A device lead is visualized in the right ventricle. There is pleural effusion in the left lateral region.  LEFT VENTRICLE PLAX 2D LVIDd:         4.70 cm     Diastology LVIDs:         3.80 cm     LV e' medial:    6.53 cm/s LV PW:         1.30 cm     LV E/e' medial:  16.1 LV IVS:        1.14 cm     LV e' lateral:   8.05 cm/s LVOT diam:     2.00 cm     LV E/e' lateral: 13.0 LV SV:         43 LV SV Index:   21 LVOT Area:     3.14 cm  LV Volumes (MOD) LV vol d, MOD A2C: 85.9 ml LV vol d, MOD A4C: 78.0 ml LV vol s, MOD A2C: 54.3 ml LV vol s, MOD A4C: 53.8 ml LV SV MOD A2C:     31.6 ml LV SV MOD A4C:     78.0 ml LV SV MOD BP:      31.4 ml RIGHT VENTRICLE            IVC RV S prime:     9.90 cm/s  IVC diam: 2.40 cm TAPSE (M-mode): 1.7 cm LEFT ATRIUM           Index        RIGHT ATRIUM           Index LA diam:      4.67 cm 2.30 cm/m   RA Area:     19.40 cm LA Vol (A2C): 82.5 ml 40.55 ml/m  RA Volume:   58.20 ml  28.60 ml/m LA Vol (A4C): 66.5 ml 32.68 ml/m  AORTIC VALVE LVOT Vmax:   77.50 cm/s LVOT Vmean:  58.000 cm/s LVOT VTI:    0.138 m  AORTA Ao Root diam: 3.40 cm Ao Asc diam:  3.30 cm Ao Desc diam: 2.40 cm MITRAL VALVE                TRICUSPID VALVE MV Area (PHT): 6.96 cm     TR Peak grad:   37.2 mmHg MV Decel Time: 109 msec     TR Vmax:        305.00 cm/s MV E velocity: 105.00 cm/s MV A velocity: 41.60 cm/s   SHUNTS MV E/A ratio:  2.52         Systemic VTI:  0.14 m                             Systemic Diam: 2.00 cm Dani Gobble Croitoru MD Electronically signed by Sanda Klein MD Signature Date/Time: 10/05/2022/2:53:24 PM    Final  CT ABDOMEN PELVIS WO CONTRAST  Result Date: 10/05/2022 CLINICAL DATA:  Abdominal pain, vomiting EXAM: CT ABDOMEN AND PELVIS WITHOUT CONTRAST TECHNIQUE: Multidetector CT imaging of the abdomen and pelvis was performed following the standard protocol without IV contrast. RADIATION DOSE REDUCTION: This exam was performed  according to the departmental dose-optimization program which includes automated exposure control, adjustment of the mA and/or kV according to patient size and/or use of iterative reconstruction technique. COMPARISON:  05/23/2022 FINDINGS: Lower chest: Moderate bilateral pleural effusions with associated compressive atelectasis in the bilateral lower lobes. Additional patchy anterior right lower lobe opacity, possibly reflecting pneumonia, less likely atelectasis. Mild lingular and left lower lobe atelectasis. Hepatobiliary: Unenhanced liver is unremarkable. Gallbladder is unremarkable. No intrahepatic or extrahepatic duct dilatation. Pancreas: Within normal limits. Spleen: Within normal limits. Adrenals/Urinary Tract: Adrenal glands are within normal limits. Kidneys are within normal limits. No renal, ureteral, or bladder calculi. No hydronephrosis. Mildly thick-walled bladder. Stomach/Bowel: Stomach is within normal limits. No evidence of bowel obstruction. Normal appendix (series 3/image 66). No colonic wall thickening or inflammatory changes. Vascular/Lymphatic: No evidence of abdominal aortic aneurysm. No suspicious abdominopelvic lymphadenopathy. Reproductive: Uterus is within normal limits. Bilateral ovaries are within normal limits. Other: Trace pelvic ascites. Musculoskeletal: Visualized osseous structures are within normal limits. IMPRESSION: No evidence of bowel obstruction. Normal appendix. No CT findings to account for the patient's abdominal pain. Mildly thick-walled bladder, correlate for cystitis. Moderate bilateral pleural effusions with associated compressive atelectasis in the bilateral lower lobes. Additional patchy anterior right lower lobe opacity, possibly reflecting pneumonia, less likely atelectasis. Electronically Signed   By: Julian Hy M.D.   On: 10/05/2022 01:13      Signature  -   Lala Lund M.D on 10/07/2022 at 7:43 AM   -  To page go to [email or web address]

## 2022-10-07 NOTE — Progress Notes (Addendum)
[facility] appearing contniued abdominal discomfort, nausea.   Objective (!) 176/70 (!) 130 97.9 F (36.6 C) (Axillary) 20 98%  Intake/Output Summary (Last 24 hours) at 10/07/2022 0741 Last data filed at 10/07/2022 0300 Gross per 24 hour  Intake 120 ml  Output 800 ml  Net -680 ml    B UE palpable radial pulses, grip 5/5 grossly equal Lungs non labored breathing   Assessment/Planning: Patient recently underwent left first stage brachiobasilic fistula with Dr. Donnetta Hutching on 10/01/2022.   Duplex confirms this is thrombosed.  RSV pneumonia    She is still feeling ill with abdominal pain and nausea.  Pending improved of pulmonary/abdominal issues we will need to provided her with new access.   Roxy Horseman 10/07/2022 7:41 AM --  Laboratory Lab Results: Recent Labs    10/06/22 0730 10/07/22 0256  WBC 6.7 5.7  HGB 7.4* 7.2*  HCT 23.4* 21.4*  PLT 207 208   BMET Recent Labs    10/06/22 0730 10/07/22 0256  NA 137 133*  K 4.3 3.8  CL 106 104  CO2 16* 21*  GLUCOSE 227* 133*  BUN 48* 57*  CREATININE 4.73* 5.31*  CALCIUM 8.0* 7.8*    COAG Lab Results  Component Value Date   INR 1.0 08/27/2022   INR 0.9 04/21/2022   INR 0.9 04/01/2022   No results found for: "PTT"  I have seen and evaluated the patient. I agree with the PA note as documented above.  She is getting a TDC with IR today to start dialysis.  Her left first stage basilic vein fistula placed by Dr. Donnetta Hutching is thrombosed.  I will follow-up tomorrow to discuss next steps and likely would need a left arm AV graft.  Marty Heck, MD [facility] Office: [phone]

## 2022-10-07 NOTE — Evaluation (Signed)
Physical Therapy Evaluation Patient Details Name: [name] MRN: [id number] DOB: 10/30/1970 Today's Date: 10/07/2022  History of Present Illness  (P) Patient is a 52 y/o female who presents on 3/1 with abdominal pain, N/V, SOB and possible ICD shocks. Found to have RSV PNA, AKI on CKD with likely initiation of dialysis, chronic gastroparesis and thrombosed LUE AVF. PMH includes HTN, DM2, gastroparesis, CKD, CHF, sickle cell trait, depression, neuropathy.  Clinical Impression  Patient presents with pain, generalized weakness, dizziness, impaired balance, decreased activity tolerance, SOB and impaired mobility s/p above. Pt is independent and lives at home with her 2 sons. Today, pt requires Min A for standing and taking a few steps along side bed but limited by fatigue and SOB. Reports coughing up foam looking phlegm. Hoping pt will progress well once feeling better and with increased activity. Encouraged sitting in chair later for dinner. Will follow acutely to maximize independence and mobility prior to return home.      Recommendations for follow up therapy are one component of a multi-disciplinary discharge planning process, led by the attending physician.  Recommendations may be updated based on patient status, additional functional criteria and insurance authorization.  Follow Up Recommendations Home health PT      Assistance Recommended at Discharge Intermittent Supervision/Assistance  Patient can return home with the following  A little help with walking and/or transfers;A little help with bathing/dressing/bathroom;Help with stairs or ramp for entrance;Assist for transportation;Assistance with cooking/housework    Equipment Recommendations Other (comment) (TBA pending progress)  Recommendations for Other Services       Functional Status Assessment Patient has had a recent decline in their functional status and demonstrates the ability to make significant improvements in function  in a reasonable and predictable amount of time.     Precautions / Restrictions Precautions Precautions: (P) Fall Restrictions Weight Bearing Restrictions: (P) No      Mobility  Bed Mobility Overal bed mobility: Needs Assistance Bed Mobility: Rolling, Sidelying to Sit, Sit to Supine Rolling: Modified independent (Device/Increase time) Sidelying to sit: Min guard, HOB elevated   Sit to supine: Min assist   General bed mobility comments: Heavy use of rail, no assist needed. Mild dizziness reported. Min A to bring LEs back into bed. Rolling to right/left without difficulty using rail to remove bed pan.    Transfers Overall transfer level: Needs assistance Equipment used: 2 person hand held assist Transfers: Sit to/from Stand Sit to Stand: Min assist, +2 physical assistance           General transfer comment: Min A of 2 to power to standing from EOB x1.    Ambulation/Gait Ambulation/Gait assistance: +2 safety/equipment, Min assist Gait Distance (Feet): 3 Feet Assistive device: 2 person hand held assist   Gait velocity: decreased     General Gait Details: Able to side step along side bed with assist for balance. Fatigues quickly with SOB. VSS on 1.5L/min 02 [place]  Stairs            Wheelchair Mobility    Modified Rankin (Stroke Patients Only)       Balance Overall balance assessment: Needs assistance Sitting-balance support: Feet supported, No upper extremity supported Sitting balance-Leahy Scale: Good Sitting balance - Comments: Able to reach down and adjust sock without LOB.   Standing balance support: During functional activity Standing balance-Leahy Scale: Poor Standing balance comment: requires external support in standing  Pertinent Vitals/Pain Pain Assessment Pain Assessment: Faces Faces Pain Scale: Hurts little more Pain Location: RUE Pain Descriptors / Indicators: Discomfort Pain Intervention(s):  Monitored during session, Repositioned    Home Living Family/patient expects to be discharged to:: (P) Private residence Living Arrangements: (P) Children Available Help at Discharge: (P) Family;Available PRN/intermittently Type of Home: (P) House Home Access: (P) Stairs to enter   Entrance Stairs-Number of Steps: (P) 3   Home Layout: (P) One level Home Equipment: (P) None      Prior Function Prior Level of Function : (P) Independent/Modified Independent             Mobility Comments: (P) drives, independent, not working right now ADLs Comments: (P) independent     Hand Dominance   Dominant Hand: (P) Right    Extremity/Trunk Assessment   Upper Extremity Assessment Upper Extremity Assessment: Defer to OT evaluation    Lower Extremity Assessment Lower Extremity Assessment: Generalized weakness (Grossly ~2+-3/5 throughout LEs)    Cervical / Trunk Assessment Cervical / Trunk Assessment: Normal  Communication   Communication: (P) No difficulties  Cognition Arousal/Alertness: Awake/alert Behavior During Therapy: Flat affect Overall Cognitive Status: Within Functional Limits for tasks assessed                                 General Comments: appears [facility] for basic mobility tasks, flat.        General Comments General comments (skin integrity, edema, etc.): VSS on 1.5L/min 02 [place], BP elevated but stable.    Exercises     Assessment/Plan    PT Assessment Patient needs continued PT services  PT Problem List Decreased strength;Decreased mobility;Pain;Decreased balance;Decreased activity tolerance;Cardiopulmonary status limiting activity       PT Treatment Interventions Therapeutic activities;Gait training;Therapeutic exercise;Patient/family education;Balance training;Stair training;Functional mobility training    PT Goals (Current goals can be found in the Care Plan section)  Acute Rehab PT Goals Patient Stated Goal: to feel better PT Goal  Formulation: With patient Time For Goal Achievement: 10/21/22 Potential to Achieve Goals: Fair    Frequency Min 3X/week     Co-evaluation   Reason for Co-Treatment: (P) For patient/therapist safety;To address functional/ADL transfers PT goals addressed during session: (P) Mobility/safety with mobility;Balance;Proper use of DME         AM-PAC PT "6 Clicks" Mobility  Outcome Measure Help needed turning from your back to your side while in a flat bed without using bedrails?: None Help needed moving from lying on your back to sitting on the side of a flat bed without using bedrails?: A Little Help needed moving to and from a bed to a chair (including a wheelchair)?: A Little Help needed standing up from a chair using your arms (e.g., wheelchair or bedside chair)?: A Little Help needed to walk in hospital room?: A Lot Help needed climbing 3-5 steps with a railing? : Total 6 Click Score: 16    End of Session   Activity Tolerance: Patient limited by fatigue Patient left: in bed;with call bell/phone within reach;with bed alarm set Nurse Communication: Mobility status PT Visit Diagnosis: Pain;Muscle weakness (generalized) (M62.81);Unsteadiness on feet (R26.81);Difficulty in walking, not elsewhere classified (R26.2) Pain - Right/Left: Right Pain - part of body: Arm    Time: 1115-1135 PT Time Calculation (min) (ACUTE ONLY): 20 min   Charges:   PT Evaluation $PT Eval Moderate Complexity: 1 Mod          Lenvil Swaim  Karren Cobble, DPT Acute Rehabilitation Services Secure chat preferred Office [place] 10/07/2022, 12:50 PM

## 2022-10-07 NOTE — Evaluation (Signed)
Occupational Therapy Evaluation Patient Details Name: [name] MRN: [id number] DOB: 07-14-1971 Today's Date: 10/07/2022   History of Present Illness Patient is a 52 y/o female who presents on 3/1 with abdominal pain, N/V, SOB and possible ICD shocks. Found to have RSV PNA, AKI on CKD with likely initiation of dialysis, chronic gastroparesis and thrombosed LUE AVF. PMH includes HTN, DM2, gastroparesis, CKD, CHF, sickle cell trait, depression, neuropathy.   Clinical Impression   Henessey was evaluated s/p the above admission list. She is typically indep at baseline. Upon evaluation she was limited by general malaise, weakness and poor activity tolerance. Overall she required min A +2 for mobility, and only able to tolerate standing and side stepping at the EOB this date. Due to the deficits listed below, she also requires up to mod A for ADLs. Pt will benefit from continued acute OT services. Anticipate good progress acutely. Recommend d/c home with [facility].       Recommendations for follow up therapy are one component of a multi-disciplinary discharge planning process, led by the attending physician.  Recommendations may be updated based on patient status, additional functional criteria and insurance authorization.   Follow Up Recommendations  Home health OT (pending acute progress)     Assistance Recommended at Discharge Frequent or constant Supervision/Assistance  Patient can return home with the following A little help with walking and/or transfers;A little help with bathing/dressing/bathroom;Assistance with cooking/housework;Assist for transportation;Help with stairs or ramp for entrance    Functional Status Assessment  Patient has had a recent decline in their functional status and demonstrates the ability to make significant improvements in function in a reasonable and predictable amount of time.  Equipment Recommendations  Other (comment) (pending acute progress)       Precautions  / Restrictions Precautions Precautions: Fall Restrictions Weight Bearing Restrictions: No      Mobility Bed Mobility Overal bed mobility: Needs Assistance Bed Mobility: Rolling, Sidelying to Sit, Sit to Supine Rolling: Modified independent (Device/Increase time) Sidelying to sit: Min guard, HOB elevated   Sit to supine: Min assist   General bed mobility comments: Heavy use of rail, no assist needed. Mild dizziness reported. Min A to bring LEs back into bed. Rolling to right/left without difficulty using rail to remove bed pan.    Transfers Overall transfer level: Needs assistance Equipment used: 2 person hand held assist Transfers: Sit to/from Stand Sit to Stand: Min assist, +2 physical assistance           General transfer comment: Min A of 2 to power to standing from EOB x1.      Balance Overall balance assessment: Needs assistance Sitting-balance support: Feet supported, No upper extremity supported Sitting balance-Leahy Scale: Good     Standing balance support: During functional activity Standing balance-Leahy Scale: Poor                             ADL either performed or assessed with clinical judgement   ADL Overall ADL's : Needs assistance/impaired Eating/Feeding: Independent   Grooming: Set up;Sitting   Upper Body Bathing: Set up;Sitting   Lower Body Bathing: Moderate assistance;Sit to/from stand   Upper Body Dressing : Set up;Sitting   Lower Body Dressing: Moderate assistance;Sit to/from stand   Toilet Transfer: Minimal assistance;Stand-pivot   Toileting- Clothing Manipulation and Hygiene: Moderate assistance;Sit to/from stand       Functional mobility during ADLs: Minimal assistance General ADL Comments: limited by weakness and general malaise  Vision Baseline Vision/History: 0 No visual deficits Vision Assessment?: No apparent visual deficits     Perception Perception Perception Tested?: No   Praxis Praxis Praxis  tested?: Not tested    Pertinent Vitals/Pain Pain Assessment Faces Pain Scale: Hurts little more Pain Location: RUE Pain Descriptors / Indicators: Discomfort     Hand Dominance Right   Extremity/Trunk Assessment Upper Extremity Assessment Upper Extremity Assessment: Generalized weakness   Lower Extremity Assessment Lower Extremity Assessment: Defer to PT evaluation   Cervical / Trunk Assessment Cervical / Trunk Assessment: Normal   Communication Communication Communication: No difficulties   Cognition Arousal/Alertness: Awake/alert Behavior During Therapy: Flat affect Overall Cognitive Status: Within Functional Limits for tasks assessed                                 General Comments: appears [facility] for basic mobility tasks, flat.     General Comments  VSS on 1.5L, BP elevated but stable            Home Living Family/patient expects to be discharged to:: Private residence Living Arrangements: Children Available Help at Discharge: Family;Available PRN/intermittently Type of Home: House Home Access: Stairs to enter [employer] of Steps: 3   Home Layout: One level     Bathroom Shower/Tub: Occupational psychologist: Standard     Home Equipment: None          Prior Functioning/Environment Prior Level of Function : Independent/Modified Independent             Mobility Comments: drives, independent, not working right now ADLs Comments: independent        OT Problem List: Decreased strength;Decreased range of motion;Decreased activity tolerance;Impaired balance (sitting and/or standing);Decreased safety awareness;Decreased knowledge of use of DME or AE;Decreased knowledge of precautions      OT Treatment/Interventions: Self-care/ADL training;Therapeutic exercise;DME and/or AE instruction;Therapeutic activities;Patient/family education;Balance training    OT Goals(Current goals can be found in the care plan section)  Acute Rehab OT Goals Patient Stated Goal: to feel better OT Goal Formulation: With patient Time For Goal Achievement: 10/21/22 Potential to Achieve Goals: Good ADL Goals Pt Will Perform Grooming: with min guard assist;standing Pt Will Perform Lower Body Dressing: with min guard assist;sit to/from stand Pt Will Transfer to Toilet: with min guard assist;ambulating Pt Will Perform Toileting - Clothing Manipulation and hygiene: with modified independence;sitting/lateral leans Additional ADL Goal #1: Pt will complete at least 8x minutes of OOB funcitonal activity to demonstrate increased tolerance  OT Frequency: Min 2X/week    Co-evaluation PT/OT/SLP Co-Evaluation/Treatment: Yes Reason for Co-Treatment: For patient/therapist safety;To address functional/ADL transfers PT goals addressed during session: Mobility/safety with mobility;Balance;Proper use of DME        AM-PAC OT "6 Clicks" Daily Activity     Outcome Measure Help from another person eating meals?: None Help from another person taking care of personal grooming?: A Little Help from another person toileting, which includes using toliet, bedpan, or urinal?: A Lot Help from another person bathing (including washing, rinsing, drying)?: A Lot Help from another person to put on and taking off regular upper body clothing?: A Little Help from another person to put on and taking off regular lower body clothing?: A Lot 6 Click Score: 16   End of Session Nurse Communication: Mobility status  Activity Tolerance: Patient tolerated treatment well Patient left: in bed;with call bell/phone within reach  OT Visit Diagnosis: Unsteadiness on feet (R26.81);Other  abnormalities of gait and mobility (R26.89);Muscle weakness (generalized) (M62.81)                Time: [id number] OT Time Calculation (min): 22 min Charges:  OT General Charges $OT Visit: 1 Visit OT Evaluation $OT Eval Moderate Complexity: [place], OTR/L Acute  Rehabilitation Services Office [employer] Preferred   Elliot Cousin 10/07/2022, 1:01 PM

## 2022-10-07 NOTE — Procedures (Signed)
Interventional Radiology Procedure Note  Procedure: Image guided tunneled hemodialysis catheter placement  Findings: Please refer to procedural dictation for full description. R IJ single lumen 19 cm tunneled HD.  Complications: None immediate  Estimated Blood Loss: < 5 mL  Recommendations: Catheter ready for immediate use.   Ruthann Cancer, MD

## 2022-10-07 NOTE — Progress Notes (Signed)
Patient's temperature elevated to 101.8 after 15 mins of blood transfusion(Was 98 before blood transfusion)Blood transfusion was stopped,IV line was flushed and MD and blood bank were notified.Post transfusion lab and urine sample was sent and blood with whole sets of IV line and normal saline was sent back to blood bank.

## 2022-10-07 NOTE — Progress Notes (Addendum)
Rounding Note    Patient Name: [name] Date of Encounter: 10/07/2022  [place] Cardiologist: Werner Lean, MD   Subjective   Patient laying in bed, uncomfortable appearing. Reports body aches/muscle aches and rapid HR following apparent transfusion reaction this morning. Denies chest pain, shortness of breath.   Inpatient Medications    Scheduled Meds:  [START ON 10/11/2022] aspirin EC  81 mg Oral Daily   Chlorhexidine Gluconate Cloth  6 each Topical Q0600   furosemide  80 mg Intravenous BID   guaiFENesin  10 mL Oral Q6H   hydrALAZINE  50 mg Oral Q8H   insulin aspart  0-5 Units Subcutaneous QHS   insulin aspart  0-9 Units Subcutaneous TID WC   insulin glargine-yfgn  10 Units Subcutaneous QHS   isosorbide dinitrate  10 mg Oral TID   levalbuterol  0.63 mg Nebulization BID   metoCLOPramide (REGLAN) injection  5 mg Intravenous Q6H   metoprolol succinate  50 mg Oral Daily   pantoprazole (PROTONIX) IV  40 mg Intravenous Q12H   pravastatin  40 mg Oral q1800   scopolamine  1 patch Transdermal Q72H   sodium bicarbonate  1,300 mg Oral BID   tobramycin  1-2 drop Both Eyes See admin instructions   Continuous Infusions:  anticoagulant sodium citrate     ferric gluconate (FERRLECIT) IVPB     PRN Meds: acetaminophen, albuterol, alteplase, anticoagulant sodium citrate, heparin, hydrALAZINE, LORazepam, nitroGLYCERIN, oxyCODONE-acetaminophen, prochlorperazine   Vital Signs    Vitals:   10/07/22 0731 10/07/22 0842 10/07/22 0954 10/07/22 1025  BP:  (!) 149/70 (!) 151/65 (!) 144/61  Pulse:  95  94  Resp:    (!) 29  Temp:   98 F (36.7 C) (!) 101.8 F (38.8 C)  TempSrc:   Oral   SpO2: 98%     Weight:      Height:        Intake/Output Summary (Last 24 hours) at 10/07/2022 1040 Last data filed at 10/07/2022 0300 Gross per 24 hour  Intake 120 ml  Output 800 ml  Net -680 ml      10/04/2022    5:36 PM 10/01/2022   10:01 AM 09/13/2022    9:25 AM  Last 3  Weights  Weight (lbs) 179 lb 7.3 oz 177 lb 0.5 oz 177 lb  Weight (kg) 81.4 kg 80.3 kg 80.287 kg      Telemetry    Sinus rhythm - Personally Reviewed  ECG    No new tracing - Personally Reviewed  Physical Exam   GEN: No acute distress.  Uncomfortable appearing Neck: No JVD Cardiac: RRR, no murmurs, rubs, or gallops.  Respiratory: Clear to auscultation bilaterally. GI: Soft, nontender, non-distended  MS: No edema; No deformity. Neuro:  Nonfocal  Psych: Normal affect   Labs    High Sensitivity Troponin:   Recent Labs  Lab 10/04/22 1000 10/04/22 1126  TROPONINIHS 24* 26*     Chemistry Recent Labs  Lab 10/05/22 0242 10/06/22 0730 10/07/22 0256  NA 140 137 133*  K 3.8 4.3 3.8  CL 109 106 104  CO2 22 16* 21*  GLUCOSE 184* 227* 133*  BUN 39* 48* 57*  CREATININE 4.12* 4.73* 5.31*  CALCIUM 8.0* 8.0* 7.8*  MG 1.8 1.7 1.8  PROT 5.1* 5.2* 5.3*  ALBUMIN 2.1* 2.2* 2.3*  AST 14* 42* 125*  ALT 11 36 96*  ALKPHOS 63 136* 190*  BILITOT 0.9 0.7 0.6  GFRNONAA 12* 11* 9*  ANIONGAP  $'9 15 8    'o$ Lipids No results for input(s): "CHOL", "TRIG", "HDL", "LABVLDL", "LDLCALC", "CHOLHDL" in the last 168 hours.  Hematology Recent Labs  Lab 10/06/22 0730 10/07/22 0256 10/07/22 0754  WBC 6.7 5.7 5.1  RBC 2.61* 2.44* 2.42*  HGB 7.4* 7.2* 7.1*  HCT 23.4* 21.4* 21.2*  MCV 89.7 87.7 87.6  MCH 28.4 29.5 29.3  MCHC 31.6 33.6 33.5  RDW 15.7* 15.7* 15.6*  PLT 207 208 193   Thyroid No results for input(s): "TSH", "FREET4" in the last 168 hours.  BNP Recent Labs  Lab 10/04/22 1000  BNP 1,496.7*    DDimer  Recent Labs  Lab 10/04/22 1000  DDIMER 6.70*     Radiology    DG Chest Port 1 View  Result Date: 10/07/2022 CLINICAL DATA:  Shortness of breath EXAM: PORTABLE CHEST 1 VIEW COMPARISON:  Three days ago FINDINGS: Cardiomegaly and vascular pedicle widening with diffuse interstitial opacity. No air bronchogram. No pneumothorax. ICD lead into the right ventricle. Artifact from  EKG leads. IMPRESSION: Suspect mild CHF. Electronically Signed   By: Jorje Guild M.D.   On: 10/07/2022 06:37   DG Abd Portable 1V  Result Date: 10/07/2022 CLINICAL DATA:  Nausea and vomiting EXAM: PORTABLE ABDOMEN - 1 VIEW COMPARISON:  Abdominal CT from 2 days ago FINDINGS: The bowel gas pattern is normal. No radio-opaque calculi or other significant radiographic abnormality are seen. IMPRESSION: Normal bowel gas pattern. Electronically Signed   By: Jorje Guild M.D.   On: 10/07/2022 06:36   VAS US DUPLEX DIALYSIS ACCESS (AVF, AVG)  Result Date: 10/06/2022 DIALYSIS ACCESS Patient Name:  [name]  Date of Exam:   10/06/2022 Medical Rec #: [id number]       Accession #:    [id number] Date of Birth: [birth year]      Patient Gender: F Patient Age:   67 years Exam Location:  [facility] Procedure:      VAS US DUPLEX DIALYSIS ACCESS (AVF, AVG) Referring Phys: Lawson Radar --------------------------------------------------------------------------------  Reason for Exam: No palpable thrill for AVF/AVG. Access Site: Left Upper Extremity. Access Type: Brachio-basilic, status post first stage 10/01/22. Comparison Study: No prior study Performing Technologist: Sharion Dove RVS  Examination Guidelines: A complete evaluation includes B-mode imaging, spectral Doppler, color Doppler, and power Doppler as needed of all accessible portions of each vessel. Unilateral testing is considered an integral part of a complete examination. Limited examinations for reoccurring indications may be performed as noted.    Summary: Brachiobasilic AVF appears thrombosed  *See table(s) above for measurements and observations.  Diagnosing physician: Monica Martinez MD Electronically signed by Monica Martinez MD on 10/06/2022 at 1:02:35 PM.   --------------------------------------------------------------------------------   Final    ECHOCARDIOGRAM COMPLETE  Result Date: 10/05/2022    ECHOCARDIOGRAM REPORT   Patient Name:    [name] Date of Exam: 10/05/2022 Medical Rec #:  [id number]      Height:       72.0 in Accession #:    [id number]     Weight:       179.5 lb Date of Birth:  [birth year]     BSA:          2.035 m Patient Age:    5 years       BP:           120/72 mmHg Patient Gender: F              HR:  94 bpm. Exam Location:  Inpatient Procedure: 2D Echo, Cardiac Doppler and Color Doppler Indications:    Chest Pain R07.9  History:        Patient has no prior history of Echocardiogram examinations.                 CHF, CAD and Previous Myocardial Infarction; Risk                 Factors:Diabetes and Hypertension.  Sonographer:    Luane School RDCS Referring Phys: [id number] [place]  1. Left ventricular ejection fraction, by estimation, is 40 to 45%. The left ventricle has mildly decreased function. The left ventricle demonstrates global hypokinesis. There is mild concentric left ventricular hypertrophy. Left ventricular diastolic parameters are consistent with Grade III diastolic dysfunction (restrictive). Elevated left atrial pressure.  2. Right ventricular systolic function is mildly reduced. The right ventricular size is moderately enlarged. There is moderately elevated pulmonary artery systolic pressure.  3. Left atrial size was moderately dilated.  4. Right atrial size was moderately dilated.  5. The mitral valve is normal in structure. No evidence of mitral valve regurgitation. No evidence of mitral stenosis.  6. Tricuspid valve regurgitation is moderate to severe.  7. The aortic valve is tricuspid. Aortic valve regurgitation is not visualized. No aortic stenosis is present.  8. The inferior vena cava is dilated in size with <50% respiratory variability, suggesting right atrial pressure of 15 mmHg. Comparison(s): Prior images reviewed side by side. The left ventricular function is significantly worse. The right ventricular systolic function is worse. TR severity and PA hypertension are also worse, with  evidence of hypervolemia. FINDINGS  Left Ventricle: Left ventricular ejection fraction, by estimation, is 40 to 45%. The left ventricle has mildly decreased function. The left ventricle demonstrates global hypokinesis. The left ventricular internal cavity size was normal in size. There is  mild concentric left ventricular hypertrophy. Left ventricular diastolic parameters are consistent with Grade III diastolic dysfunction (restrictive). Elevated left atrial pressure. Right Ventricle: The right ventricular size is moderately enlarged. No increase in right ventricular wall thickness. Right ventricular systolic function is mildly reduced. There is moderately elevated pulmonary artery systolic pressure. The tricuspid regurgitant velocity is 3.05 m/s, and with an assumed right atrial pressure of 15 mmHg, the estimated right ventricular systolic pressure is [id number]. Left Atrium: Left atrial size was moderately dilated. Right Atrium: Right atrial size was moderately dilated. Pericardium: Trivial pericardial effusion is present. Mitral Valve: The mitral valve is normal in structure. There is mild thickening of the mitral valve leaflet(s). Mild mitral annular calcification. No evidence of mitral valve regurgitation. No evidence of mitral valve stenosis. Tricuspid Valve: The tricuspid valve is normal in structure. Tricuspid valve regurgitation is moderate to severe. Aortic Valve: The aortic valve is tricuspid. Aortic valve regurgitation is not visualized. No aortic stenosis is present. Pulmonic Valve: The pulmonic valve was normal in structure. Pulmonic valve regurgitation is trivial. Aorta: The aortic root and ascending aorta are structurally normal, with no evidence of dilitation. Venous: The inferior vena cava is dilated in size with less than 50% respiratory variability, suggesting right atrial pressure of 15 mmHg. IAS/Shunts: No atrial level shunt detected by color flow Doppler. Additional Comments: A device lead is  visualized in the right ventricle. There is pleural effusion in the left lateral region.  LEFT VENTRICLE PLAX 2D LVIDd:         4.70 cm     Diastology LVIDs:  3.80 cm     LV e' medial:    6.53 cm/s LV PW:         1.30 cm     LV E/e' medial:  16.1 LV IVS:        1.14 cm     LV e' lateral:   8.05 cm/s LVOT diam:     2.00 cm     LV E/e' lateral: 13.0 LV SV:         43 LV SV Index:   21 LVOT Area:     3.14 cm  LV Volumes (MOD) LV vol d, MOD A2C: 85.9 ml LV vol d, MOD A4C: 78.0 ml LV vol s, MOD A2C: 54.3 ml LV vol s, MOD A4C: 53.8 ml LV SV MOD A2C:     31.6 ml LV SV MOD A4C:     78.0 ml LV SV MOD BP:      31.4 ml RIGHT VENTRICLE            IVC RV S prime:     9.90 cm/s  IVC diam: 2.40 cm TAPSE (M-mode): 1.7 cm LEFT ATRIUM           Index        RIGHT ATRIUM           Index LA diam:      4.67 cm 2.30 cm/m   RA Area:     19.40 cm LA Vol (A2C): 82.5 ml 40.55 ml/m  RA Volume:   58.20 ml  28.60 ml/m LA Vol (A4C): 66.5 ml 32.68 ml/m  AORTIC VALVE LVOT Vmax:   77.50 cm/s LVOT Vmean:  58.000 cm/s LVOT VTI:    0.138 m  AORTA Ao Root diam: 3.40 cm Ao Asc diam:  3.30 cm Ao Desc diam: 2.40 cm MITRAL VALVE                TRICUSPID VALVE MV Area (PHT): 6.96 cm     TR Peak grad:   37.2 mmHg MV Decel Time: 109 msec     TR Vmax:        305.00 cm/s MV E velocity: 105.00 cm/s MV A velocity: 41.60 cm/s   SHUNTS MV E/A ratio:  2.52         Systemic VTI:  0.14 m                             Systemic Diam: 2.00 cm Dani Gobble Croitoru MD Electronically signed by Sanda Klein MD Signature Date/Time: 10/05/2022/2:53:24 PM    Final     Cardiac Studies   Coronary CTA 01/19/2020: Impressions: 1. Coronary calcium score of 0. 2. Normal coronary origin with right dominance. 3. Normal coronary arteries. 4. Severe biatrial enlargement. _______________   Echocardiogram 10/05/2022: Impressions:  1. Left ventricular ejection fraction, by estimation, is 40 to 45%. The  left ventricle has mildly decreased function. The left ventricle   demonstrates global hypokinesis. There is mild concentric left ventricular  hypertrophy. Left ventricular diastolic  parameters are consistent with Grade III diastolic dysfunction  (restrictive). Elevated left atrial pressure.   2. Right ventricular systolic function is mildly reduced. The right  ventricular size is moderately enlarged. There is moderately elevated  pulmonary artery systolic pressure.   3. Left atrial size was moderately dilated.   4. Right atrial size was moderately dilated.   5. The mitral valve is normal in structure. No evidence of mitral valve  regurgitation.  No evidence of mitral stenosis.   6. Tricuspid valve regurgitation is moderate to severe.   7. The aortic valve is tricuspid. Aortic valve regurgitation is not  visualized. No aortic stenosis is present.   8. The inferior vena cava is dilated in size with <50% respiratory  variability, suggesting right atrial pressure of 15 mmHg.   Comparison(s): Prior images reviewed side by side. The left ventricular  function is significantly worse. The right ventricular systolic function  is worse. TR severity and PA hypertension are also worse, with evidence of  hypervolemia.   Patient Profile     [name] is a 52 y.o. 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 Feliberto Gottron.   Assessment & Plan    Chest pain Coronary microvascular disease  Patient recently reported chest pain in clinic appointment with Dr. Gasper Sells, started on Imdur. Admitted on 3/1 with abdominal pain and hematemesis. Noted to have elevated troponin 24, 26. ECG with stable T wave inversion in V4-V6.  Echo with no WMA.   No evidence of ACS at this time. Patient now on Isordil '10mg'$  with good response, no recurrent chest pain (has Imdur listed as PTA med but was not taking).  As previously planned in outpatient cardiology visit, would like to get PET myocardial perfusion test to further characterize coronary anatomy Continue pravastatin '40mg'$ .  Chronic HFmrEF  Patient with EF previously down to 40-45% had EF recovery to 50-55%. Repeat TTE this admission now shows LVEF back in the range of 40-45%. BNP elevated to 1496 and chest x-ray with bilateral perihilar and bibasilar opacities.   On physical exam, patient appears to be euvolemic. Suspect that elevated BNP was driven by profound hypertension on admission as well as CKD IV/V.  Continue GDMT of Hydralazine (increased to '75mg'$  TID), Isordil, Metoprolol  Hypertension  Hypertension slightly worse today following initial improving on admission. As above, Hydralazine increased.  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 Dialysis initiation tentatively planned if creatinine continues to rise.  VF arrest  Patient with ICD following VF arrest in September of 2023. Interrogation this admission shows appropriate function.  Per primary team:  RSV pneumonia Transfusion reaction Gastroparesis DM type I      For questions or updates, please contact [facility] Please consult [email or web address] for contact info under        Signed, Lily Kocher, PA-C  10/07/2022, 10:40 AM    Personally seen and examined. Agree with APP above with the following comments:  Briefly 52 yo F with prior VF arrest s/p  s/p ICD MDT, with NSVT; HFrEF [id number] complicated by CKD IV-V with clinical suspicion of coronary microvascular disease.  Enduped with with rival infection  Admitted for intractable abdominal pain.  Had plans to start HD. She was  transfused earlier today and feels terrible.  Exam notable for RRR, mild tachypnea.  Creatinine is 5.31 One run of NSVT  Would recommend  - device has been interrogated and therapies are on - I have increased isodril dose; our team has also increase the hydralazine dose - likely increase succinate dose  10/08/22 and potentially sign off  - outpatient, with nephrology clearance, would like to get CMR for etiology - will not plan for PET MPI at this time.  Rudean Haskell, MD [facility] [employer], [address] [place], [place] [address] [phone]  12:50 PM

## 2022-10-08 ENCOUNTER — Encounter (HOSPITAL_COMMUNITY): Payer: Self-pay | Admitting: Radiology

## 2022-10-08 ENCOUNTER — Inpatient Hospital Stay (HOSPITAL_COMMUNITY): Payer: BC Managed Care – PPO

## 2022-10-08 DIAGNOSIS — I4729 Other ventricular tachycardia: Secondary | ICD-10-CM | POA: Diagnosis not present

## 2022-10-08 DIAGNOSIS — K920 Hematemesis: Secondary | ICD-10-CM

## 2022-10-08 DIAGNOSIS — N185 Chronic kidney disease, stage 5: Secondary | ICD-10-CM | POA: Diagnosis not present

## 2022-10-08 LAB — COMPREHENSIVE METABOLIC PANEL
ALT: 84 U/L — ABNORMAL HIGH (ref 0–44)
AST: 59 U/L — ABNORMAL HIGH (ref 15–41)
Albumin: 2.3 g/dL — ABNORMAL LOW (ref 3.5–5.0)
Alkaline Phosphatase: 149 U/L — ABNORMAL HIGH (ref 38–126)
Anion gap: 15 (ref 5–15)
BUN: 46 mg/dL — ABNORMAL HIGH (ref 6–20)
CO2: 19 mmol/L — ABNORMAL LOW (ref 22–32)
Calcium: 7.5 mg/dL — ABNORMAL LOW (ref 8.9–10.3)
Chloride: 100 mmol/L (ref 98–111)
Creatinine, Ser: 4.62 mg/dL — ABNORMAL HIGH (ref 0.44–1.00)
GFR, Estimated: 11 mL/min — ABNORMAL LOW (ref >60)
Glucose, Bld: 147 mg/dL — ABNORMAL HIGH (ref 70–99)
Potassium: 3.7 mmol/L (ref 3.5–5.1)
Sodium: 134 mmol/L — ABNORMAL LOW (ref 135–145)
Total Bilirubin: 0.8 mg/dL (ref 0.3–1.2)
Total Protein: 5.2 g/dL — ABNORMAL LOW (ref 6.5–8.1)

## 2022-10-08 LAB — CBC WITH DIFFERENTIAL/PLATELET
Abs Immature Granulocytes: 0.04 10*3/uL (ref 0.00–0.07)
Abs Immature Granulocytes: 0.03 10*3/uL (ref 0.00–0.07)
Basophils Absolute: 0 10*3/uL (ref 0.0–0.1)
Basophils Absolute: 0 10*3/uL (ref 0.0–0.1)
Basophils Relative: 0 %
Basophils Relative: 0 %
Eosinophils Absolute: 0 10*3/uL (ref 0.0–0.5)
Eosinophils Absolute: 0 10*3/uL (ref 0.0–0.5)
Eosinophils Relative: 1 %
Eosinophils Relative: 1 %
HCT: 21.3 % — ABNORMAL LOW (ref 36.0–46.0)
HCT: 20.7 % — ABNORMAL LOW (ref 36.0–46.0)
Hemoglobin: 7.1 g/dL — ABNORMAL LOW (ref 12.0–15.0)
Hemoglobin: 7 g/dL — ABNORMAL LOW (ref 12.0–15.0)
Immature Granulocytes: 1 %
Immature Granulocytes: 1 %
Lymphocytes Relative: 11 %
Lymphocytes Relative: 17 %
Lymphs Abs: 0.7 10*3/uL (ref 0.7–4.0)
Lymphs Abs: 1 10*3/uL (ref 0.7–4.0)
MCH: 29.1 pg (ref 26.0–34.0)
MCH: 29.4 pg (ref 26.0–34.0)
MCHC: 33.3 g/dL (ref 30.0–36.0)
MCHC: 33.8 g/dL (ref 30.0–36.0)
MCV: 87.3 fL (ref 80.0–100.0)
MCV: 87 fL (ref 80.0–100.0)
Monocytes Absolute: 0.3 10*3/uL (ref 0.1–1.0)
Monocytes Absolute: 0.2 10*3/uL (ref 0.1–1.0)
Monocytes Relative: 4 %
Monocytes Relative: 4 %
Neutro Abs: 5.7 10*3/uL (ref 1.7–7.7)
Neutro Abs: 4.8 10*3/uL (ref 1.7–7.7)
Neutrophils Relative %: 83 %
Neutrophils Relative %: 77 %
Platelets: 202 10*3/uL (ref 150–400)
Platelets: 195 10*3/uL (ref 150–400)
RBC: 2.44 MIL/uL — ABNORMAL LOW (ref 3.87–5.11)
RBC: 2.38 MIL/uL — ABNORMAL LOW (ref 3.87–5.11)
RDW: 15.7 % — ABNORMAL HIGH (ref 11.5–15.5)
RDW: 16 % — ABNORMAL HIGH (ref 11.5–15.5)
WBC: 6.7 10*3/uL (ref 4.0–10.5)
WBC: 6.1 10*3/uL (ref 4.0–10.5)
nRBC: 0 % (ref 0.0–0.2)
nRBC: 0 % (ref 0.0–0.2)

## 2022-10-08 LAB — GLUCOSE, CAPILLARY
Glucose-Capillary: 169 mg/dL — ABNORMAL HIGH (ref 70–99)
Glucose-Capillary: 117 mg/dL — ABNORMAL HIGH (ref 70–99)
Glucose-Capillary: 174 mg/dL — ABNORMAL HIGH (ref 70–99)
Glucose-Capillary: 170 mg/dL — ABNORMAL HIGH (ref 70–99)

## 2022-10-08 LAB — PARATHYROID HORMONE, INTACT (NO CA): PTH: 129 pg/mL — ABNORMAL HIGH (ref 15–65)

## 2022-10-08 LAB — BRAIN NATRIURETIC PEPTIDE: B Natriuretic Peptide: 2182.7 pg/mL — ABNORMAL HIGH (ref 0.0–100.0)

## 2022-10-08 LAB — MRSA NEXT GEN BY PCR, NASAL: MRSA by PCR Next Gen: NOT DETECTED

## 2022-10-08 LAB — MAGNESIUM: Magnesium: 1.6 mg/dL — ABNORMAL LOW (ref 1.7–2.4)

## 2022-10-08 LAB — PROCALCITONIN: Procalcitonin: 8.13 ng/mL

## 2022-10-08 MED ORDER — METOPROLOL SUCCINATE ER 50 MG PO TB24: 75.0000 mg | ORAL_TABLET | Freq: Every day | ORAL | Status: DC

## 2022-10-08 MED ORDER — METOPROLOL SUCCINATE ER 50 MG PO TB24
50.0000 mg | ORAL_TABLET | Freq: Every day | ORAL | Status: DC
  Administered 2022-10-09 – 2022-10-15 (×5): 50 mg via ORAL
  Filled 2022-10-08 (×6): qty 1

## 2022-10-08 MED ORDER — VANCOMYCIN VARIABLE DOSE PER UNSTABLE RENAL FUNCTION (PHARMACIST DOSING): Status: DC

## 2022-10-08 MED ORDER — VANCOMYCIN HCL 1500 MG/300ML IV SOLN
1500.0000 mg | Freq: Once | INTRAVENOUS | Status: AC
  Administered 2022-10-08: 1500 mg via INTRAVENOUS
  Filled 2022-10-08: qty 300

## 2022-10-08 MED ORDER — METOPROLOL SUCCINATE ER 100 MG PO TB24: 100.0000 mg | ORAL_TABLET | Freq: Every day | ORAL | Status: DC

## 2022-10-08 MED ORDER — DARBEPOETIN ALFA 100 MCG/0.5ML IJ SOSY
100.0000 ug | PREFILLED_SYRINGE | INTRAMUSCULAR | Status: DC
  Filled 2022-10-08: qty 0.5

## 2022-10-08 MED ORDER — METOPROLOL SUCCINATE ER 25 MG PO TB24: 25.0000 mg | ORAL_TABLET | Freq: Once | ORAL | Status: DC

## 2022-10-08 NOTE — Progress Notes (Signed)
Received call from lab that DIC Panel is discontinued due to insufficient blood

## 2022-10-08 NOTE — Progress Notes (Signed)
Requested to see pt for out-pt HD needs at d/c. Spoke to pt via phone. Introduced self and explained role. Pt lives in [place] and prefers a clinic in the [place] area. Pt plans to drive self to HD appts at d/c. Referral submitted to [facility] admissions for review. Will assist as needed.   Melven Sartorius Renal Navigator [phone]

## 2022-10-08 NOTE — Progress Notes (Signed)
PROGRESS NOTE                                                                                                                                                                                                             Patient Demographics:    [name], is a 52 y.o. 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   Subjective:   Patient in bed, appears comfortable, denies any headache, no fever, no chest pain or pressure, no shortness of breath , no abdominal pain. No new focal weakness, feels weak all over.   Assessment  & Plan :    Diabetic gastroparesis acute on chronic exacerbation due to RSV viral infection causing recurrent and persistent nausea vomiting.  Continue supportive care, abdominal exam is benign, although she is on Reglan 3 times a day continues to have nausea vomiting, continue supportive care and consult GI, abdominal exam KUB stable.  Fevers starting 10/07/2022.  In the morning around 10 AM she had  a's low-grade fever due to febrile transfusion reaction, this completely resolved after transfusion was held, she subsequently had HD catheter placed underwent her first HD treatment which was finished at 6 PM on 10/07/2022, later at night on 10/07/2022 patient had a temp of 102.  Currently no obvious source.  Blood cultures have been drawn, vancomycin will be given after the blood cultures are drawn on 10/08/2022.  ID has been consulted to assist  as picture is quite confusing.  Chest x-ray and recent UA unremarkable.  RSV viral pneumonitis.  Currently not hypoxic.  No acute issues, this has clinically resolved.  Acute on chronic anemia.  Does have underlying anemia of chronic disease with acute drop due to multiple blood draws and acute illness, also could have some mild upper GI bleed due to persistent nausea vomiting causing Mallory-Weiss tear, IV PPI, hold heparin and aspirin for few days, she was ordered 1 unit of packed RBC transfusion on 10/07/2022 but had a mild febrile transfusion reaction, transfusion labs checked which are stable, case discussed with pathologist on call on 10/08/2022.  All changes consistent with mild febrile transfusion reaction.  In the future if transfusion is given again probably premedication with Solu-Medrol and Tylenol will be helpful.  Chronic combined diastolic and systolic heart failure history of V. tach with cardiac arrest requiring AICD placement.  EF 40 to 45%. Cardiology is following, currently compensated, continue combination of beta-blocker, Isordil and hydralazine, no ACE/ARB due to underlying renal insufficiency, mild troponin bump in non-ACS pattern due to underlying CHF and stress of #1 and 2 above, continue monitoring with supportive care.  Hypertension.  Hypertensive emergency upon admission, currently blood pressure stable on present regimen monitor and adjust.  AKI on CKD 5.  Nephrology following, may require dialysis soon, she does have left upper extremity AV  fistula which unfortunately has clotted, VVS following.  May require HD catheter in the short-term.  Left arm AV fistula.  Clotted off.  VVS following.  Dyslipidemia.  On statin.    DM type II.  On sliding scale and Lantus.  Monitor.  Lab Results  Component Value Date   HGBA1C 7.5 (H) 10/05/2022   CBG (last 3)  Recent Labs    10/07/22 1545 10/07/22 2121 10/08/22 0732  GLUCAP 135* 187* 169*         Condition -  Guarded  Family Communication  :  None  Code Status :  Full  Consults  :  VVS, Renal, Cards, GI, ID  PUD Prophylaxis : PPI   Procedures  :     TTE -  1. Left ventricular ejection fraction, by estimation, is 40 to 45%. The left ventricle has mildly decreased function. The left ventricle demonstrates global hypokinesis. There is mild concentric left ventricular hypertrophy. Left ventricular diastolic parameters are consistent with Grade III diastolic dysfunction (restrictive). Elevated left atrial pressure.  2. Right ventricular systolic function is mildly reduced. The right ventricular size is moderately enlarged. There is moderately elevated pulmonary artery systolic pressure.  3. Left atrial size was moderately dilated.  4. Right atrial size was moderately dilated.  5. The mitral valve is normal in structure. No evidence of mitral valve regurgitation. No evidence of mitral stenosis.  6. Tricuspid valve regurgitation is moderate to severe.  7. The aortic valve is tricuspid. Aortic valve regurgitation is not visualized. No aortic stenosis is present.  8. The inferior vena cava is dilated in size with <50% respiratory variability, suggesting right atrial pressure of 15 mmHg. Comparison(s): Prior images reviewed side by side. The left ventricular function is significantly worse. The right ventricular systolic function is worse  L Arm [place] -  Brachiobasilic AVF appears thrombosed  CT -  No evidence of bowel obstruction. Normal appendix. No CT findings to account for the  patient's abdominal pain. Mildly thick-walled bladder, correlate for cystitis. Moderate bilateral pleural effusions with associated compressive atelectasis in the bilateral lower lobes. Additional patchy anterior right  lower lobe opacity, possibly reflecting pneumonia, less likely atelectasis.      Disposition Plan  :    Status is: Inpatient   DVT Prophylaxis  :    Place and maintain sequential compression device Start: 10/07/22 0554  Lab Results  Component Value Date   PLT 202 10/08/2022    Diet :  Diet Order             DIET SOFT Fluid consistency: Thin  Diet effective now                    Inpatient Medications  Scheduled Meds:  [START ON 10/11/2022] aspirin EC  81 mg Oral Daily   Chlorhexidine Gluconate Cloth  6 each Topical Q0600   furosemide  80 mg Intravenous BID   hydrALAZINE  75 mg Oral Q8H   insulin aspart  0-5 Units Subcutaneous QHS   insulin aspart  0-9 Units Subcutaneous TID WC   insulin glargine-yfgn  10 Units Subcutaneous QHS   isosorbide dinitrate  20 mg Oral TID   levalbuterol  0.63 mg Nebulization BID   [START ON 10/09/2022] metoprolol succinate  50 mg Oral Daily   pantoprazole (PROTONIX) IV  40 mg Intravenous Q12H   pravastatin  40 mg Oral q1800   scopolamine  1 patch Transdermal Q72H   sodium bicarbonate  1,300 mg Oral BID   tobramycin  1-2 drop Both Eyes See admin instructions   Continuous Infusions:  ferric gluconate (FERRLECIT) IVPB Stopped (10/07/22 2007)   PRN Meds:.acetaminophen, albuterol, hydrALAZINE, LORazepam, nitroGLYCERIN, oxyCODONE-acetaminophen, prochlorperazine    Objective:   Vitals:   10/08/22 0400 10/08/22 0626 10/08/22 0731 10/08/22 0757  BP: (!) 152/69 (!) 152/69 (!) 132/55   Pulse: 99  96   Resp: 20  (!) 21   Temp: 99.3 F (37.4 C) (!) 102.6 F (39.2 C) (!) 101 F (38.3 C)   TempSrc: Oral Oral Oral   SpO2:    99%  Weight:      Height:        Wt Readings from Last 3 Encounters:  10/07/22 81.2 kg  10/01/22  80.3 kg  09/13/22 80.3 kg     Intake/Output Summary (Last 24 hours) at 10/08/2022 1004 Last data filed at 10/07/2022 1832 Gross per 24 hour  Intake --  Output 800 ml  Net -800 ml     Physical Exam  Awake Alert, No new F.N deficits, Normal affect [place].AT,PERRAL Supple Neck, No JVD,   Symmetrical Chest wall movement, Good air movement bilaterally, CTAB RRR,No Gallops,Rubs or new Murmurs,  +ve B.Sounds, Abd Soft, No tenderness,   No Cyanosis, Clubbing or edema        Data Review:    Recent Labs  Lab 10/04/22 1000 10/05/22 0242 10/06/22 0730 10/07/22 0256 10/07/22 0754 10/08/22 0606  WBC 7.2 7.1 6.7 5.7 5.1 6.7  HGB 9.4* 7.7* 7.4* 7.2* 7.1* 7.1*  HCT 28.6* 23.1* 23.4* 21.4* 21.2* 21.3*  PLT 288 236 207 208 193 202  MCV 86.9 88.5 89.7 87.7 87.6 87.3  MCH 28.6 29.5 28.4 29.5 29.3 29.1  MCHC 32.9 33.3 31.6 33.6 33.5 33.3  RDW 15.4 15.6* 15.7* 15.7* 15.6* 15.7*  LYMPHSABS 0.5*  --   --   --   --  0.7  MONOABS 0.1  --   --   --   --  0.3  EOSABS 0.0  --   --   --   --  0.0  BASOSABS 0.0  --   --   --   --  0.0    Recent Labs  Lab 10/04/22 1000 10/05/22 0242 10/06/22 0730 10/07/22 0256 10/08/22 0606  NA 140 140 137 133* 134*  K 4.1 3.8 4.3 3.8 3.7  CL 107 109 106 104 100  CO2 23 22 16* 21* 19*  ANIONGAP '10 9 15 8 15  '$ GLUCOSE 288* 184* 227* 133* 147*  BUN 37* 39* 48* 57* 46*  CREATININE 3.61* 4.12* 4.73* 5.31* 4.62*  AST 9* 14* 42* 125* 59*  ALT 14 11 36 96* 84*  ALKPHOS 83 63 136* 190* 149*  BILITOT 1.0 0.9 0.7 0.6 0.8  ALBUMIN 3.6 2.1* 2.2* 2.3* 2.3*  CRP  --  2.8* 9.8* 8.8*  --   DDIMER 6.70*  --   --   --   --   LATICACIDVEN 1.2  --   --   --   --   HGBA1C  --  7.5*  --   --   --   BNP 1,496.7*  --   --   --  2,182.7*  MG  --  1.8 1.7 1.8 1.6*  CALCIUM 8.9 8.0* 8.0* 7.8* 7.5*      Recent Labs  Lab 10/04/22 1000 10/05/22 0242 10/06/22 0730 10/07/22 0256 10/08/22 0606  CRP  --  2.8* 9.8* 8.8*  --   DDIMER 6.70*  --   --   --   --    LATICACIDVEN 1.2  --   --   --   --   HGBA1C  --  7.5*  --   --   --   BNP 1,496.7*  --   --   --  2,182.7*  MG  --  1.8 1.7 1.8 1.6*  CALCIUM 8.9 8.0* 8.0* 7.8* 7.5*    Recent Labs  Lab 10/04/22 1000 10/05/22 0242 10/06/22 0730 10/07/22 0256 10/07/22 0754 10/08/22 0606  WBC 7.2 7.1 6.7 5.7 5.1 6.7  PLT 288 236 207 208 193 202  CRP  --  2.8* 9.8* 8.8*  --   --   DDIMER 6.70*  --   --   --   --   --   LATICACIDVEN 1.2  --   --   --   --   --   CREATININE 3.61* 4.12* 4.73* 5.31*  --  4.62*    ------------------------------------------------------------------------------------------------------------------ No results for input(s): "CHOL", "HDL", "LDLCALC", "TRIG", "CHOLHDL", "LDLDIRECT" in the last 72 hours.  Lab Results  Component Value Date   HGBA1C 7.5 (H) 10/05/2022   Radiology Reports DG Chest Port 1 View  Result Date: 10/07/2022 CLINICAL DATA:  Shortness of breath EXAM: PORTABLE CHEST 1 VIEW COMPARISON:  Three days ago FINDINGS: Cardiomegaly and vascular pedicle widening with diffuse interstitial opacity. No air bronchogram. No pneumothorax. ICD lead into the right ventricle. Artifact from EKG leads. IMPRESSION: Suspect mild CHF. Electronically Signed   By: Jorje Guild M.D.   On: 10/07/2022 06:37   DG Abd Portable 1V  Result Date: 10/07/2022 CLINICAL DATA:  Nausea and vomiting EXAM: PORTABLE ABDOMEN - 1 VIEW COMPARISON:  Abdominal CT from 2 days ago FINDINGS: The bowel gas pattern is normal. No radio-opaque calculi or other significant radiographic abnormality are seen. IMPRESSION: Normal bowel gas pattern. Electronically Signed   By: Jorje Guild M.D.   On: 10/07/2022 06:36   VAS US DUPLEX DIALYSIS ACCESS (AVF, AVG)  Result Date: 10/06/2022 DIALYSIS ACCESS Patient Name:  [name]  Date of Exam:   10/06/2022 Medical Rec #: [id number]  Accession #:    [id number] Date of Birth: 03/17/1971      Patient Gender: F Patient Age:   89 years Exam Location:  [facility] [place] Procedure:      VAS US DUPLEX DIALYSIS ACCESS (AVF, AVG) Referring Phys: Lawson Radar --------------------------------------------------------------------------------  Reason for Exam: No palpable thrill for AVF/AVG. Access Site: Left Upper Extremity. Access Type: Brachio-basilic, status post first stage 10/01/22. Comparison Study: No prior study Performing Technologist: Sharion Dove RVS  Examination Guidelines: A complete evaluation includes B-mode imaging, spectral Doppler, color Doppler, and power Doppler as needed of all accessible portions of each vessel. Unilateral testing is considered an integral part of a complete examination. Limited examinations for reoccurring indications may be performed as noted.    Summary: Brachiobasilic AVF appears thrombosed  *See table(s) above for measurements and observations.  Diagnosing physician: Monica Martinez MD Electronically signed by Monica Martinez MD on 10/06/2022 at 1:02:35 PM.   --------------------------------------------------------------------------------   Final    ECHOCARDIOGRAM COMPLETE  Result Date: 10/05/2022    ECHOCARDIOGRAM REPORT   Patient Name:   [name] Date of Exam: 10/05/2022 Medical Rec #:  [id number]      Height:       72.0 in Accession #:    [id number]     Weight:       179.5 lb Date of Birth:  May 29, 1971     BSA:          2.035 m Patient Age:    56 years       BP:           120/72 mmHg Patient Gender: F              HR:           94 bpm. Exam Location:  Inpatient Procedure: 2D Echo, Cardiac Doppler and Color Doppler Indications:    Chest Pain R07.9  History:        Patient has no prior history of Echocardiogram examinations.                 CHF, CAD and Previous Myocardial Infarction; Risk                 Factors:Diabetes and Hypertension.  Sonographer:    Luane School RDCS Referring Phys: [id number] [place]  1. Left ventricular ejection fraction, by estimation, is 40 to 45%. The left ventricle has mildly  decreased function. The left ventricle demonstrates global hypokinesis. There is mild concentric left ventricular hypertrophy. Left ventricular diastolic parameters are consistent with Grade III diastolic dysfunction (restrictive). Elevated left atrial pressure.  2. Right ventricular systolic function is mildly reduced. The right ventricular size is moderately enlarged. There is moderately elevated pulmonary artery systolic pressure.  3. Left atrial size was moderately dilated.  4. Right atrial size was moderately dilated.  5. The mitral valve is normal in structure. No evidence of mitral valve regurgitation. No evidence of mitral stenosis.  6. Tricuspid valve regurgitation is moderate to severe.  7. The aortic valve is tricuspid. Aortic valve regurgitation is not visualized. No aortic stenosis is present.  8. The inferior vena cava is dilated in size with <50% respiratory variability, suggesting right atrial pressure of 15 mmHg. Comparison(s): Prior images reviewed side by side. The left ventricular function is significantly worse. The right ventricular systolic function is worse. TR severity and PA hypertension are also worse, with evidence of hypervolemia. FINDINGS  Left Ventricle: Left ventricular ejection fraction, by estimation, is 40  to 45%. The left ventricle has mildly decreased function. The left ventricle demonstrates global hypokinesis. The left ventricular internal cavity size was normal in size. There is  mild concentric left ventricular hypertrophy. Left ventricular diastolic parameters are consistent with Grade III diastolic dysfunction (restrictive). Elevated left atrial pressure. Right Ventricle: The right ventricular size is moderately enlarged. No increase in right ventricular wall thickness. Right ventricular systolic function is mildly reduced. There is moderately elevated pulmonary artery systolic pressure. The tricuspid regurgitant velocity is 3.05 m/s, and with an assumed right atrial  pressure of 15 mmHg, the estimated right ventricular systolic pressure is [id number]. Left Atrium: Left atrial size was moderately dilated. Right Atrium: Right atrial size was moderately dilated. Pericardium: Trivial pericardial effusion is present. Mitral Valve: The mitral valve is normal in structure. There is mild thickening of the mitral valve leaflet(s). Mild mitral annular calcification. No evidence of mitral valve regurgitation. No evidence of mitral valve stenosis. Tricuspid Valve: The tricuspid valve is normal in structure. Tricuspid valve regurgitation is moderate to severe. Aortic Valve: The aortic valve is tricuspid. Aortic valve regurgitation is not visualized. No aortic stenosis is present. Pulmonic Valve: The pulmonic valve was normal in structure. Pulmonic valve regurgitation is trivial. Aorta: The aortic root and ascending aorta are structurally normal, with no evidence of dilitation. Venous: The inferior vena cava is dilated in size with less than 50% respiratory variability, suggesting right atrial pressure of 15 mmHg. IAS/Shunts: No atrial level shunt detected by color flow Doppler. Additional Comments: A device lead is visualized in the right ventricle. There is pleural effusion in the left lateral region.  LEFT VENTRICLE PLAX 2D LVIDd:         4.70 cm     Diastology LVIDs:         3.80 cm     LV e' medial:    6.53 cm/s LV PW:         1.30 cm     LV E/e' medial:  16.1 LV IVS:        1.14 cm     LV e' lateral:   8.05 cm/s LVOT diam:     2.00 cm     LV E/e' lateral: 13.0 LV SV:         43 LV SV Index:   21 LVOT Area:     3.14 cm  LV Volumes (MOD) LV vol d, MOD A2C: 85.9 ml LV vol d, MOD A4C: 78.0 ml LV vol s, MOD A2C: 54.3 ml LV vol s, MOD A4C: 53.8 ml LV SV MOD A2C:     31.6 ml LV SV MOD A4C:     78.0 ml LV SV MOD BP:      31.4 ml RIGHT VENTRICLE            IVC RV S prime:     9.90 cm/s  IVC diam: 2.40 cm TAPSE (M-mode): 1.7 cm LEFT ATRIUM           Index        RIGHT ATRIUM           Index LA  diam:      4.67 cm 2.30 cm/m   RA Area:     19.40 cm LA Vol (A2C): 82.5 ml 40.55 ml/m  RA Volume:   58.20 ml  28.60 ml/m LA Vol (A4C): 66.5 ml 32.68 ml/m  AORTIC VALVE LVOT Vmax:   77.50 cm/s LVOT Vmean:  58.000 cm/s LVOT VTI:    0.138  m  AORTA Ao Root diam: 3.40 cm Ao Asc diam:  3.30 cm Ao Desc diam: 2.40 cm MITRAL VALVE                TRICUSPID VALVE MV Area (PHT): 6.96 cm     TR Peak grad:   37.2 mmHg MV Decel Time: 109 msec     TR Vmax:        305.00 cm/s MV E velocity: 105.00 cm/s MV A velocity: 41.60 cm/s   SHUNTS MV E/A ratio:  2.52         Systemic VTI:  0.14 m                             Systemic Diam: 2.00 cm Dani Gobble Croitoru MD Electronically signed by Sanda Klein MD Signature Date/Time: 10/05/2022/2:53:24 PM    Final    CT ABDOMEN PELVIS WO CONTRAST  Result Date: 10/05/2022 CLINICAL DATA:  Abdominal pain, vomiting EXAM: CT ABDOMEN AND PELVIS WITHOUT CONTRAST TECHNIQUE: Multidetector CT imaging of the abdomen and pelvis was performed following the standard protocol without IV contrast. RADIATION DOSE REDUCTION: This exam was performed according to the departmental dose-optimization program which includes automated exposure control, adjustment of the mA and/or kV according to patient size and/or use of iterative reconstruction technique. COMPARISON:  05/23/2022 FINDINGS: Lower chest: Moderate bilateral pleural effusions with associated compressive atelectasis in the bilateral lower lobes. Additional patchy anterior right lower lobe opacity, possibly reflecting pneumonia, less likely atelectasis. Mild lingular and left lower lobe atelectasis. Hepatobiliary: Unenhanced liver is unremarkable. Gallbladder is unremarkable. No intrahepatic or extrahepatic duct dilatation. Pancreas: Within normal limits. Spleen: Within normal limits. Adrenals/Urinary Tract: Adrenal glands are within normal limits. Kidneys are within normal limits. No renal, ureteral, or bladder calculi. No hydronephrosis. Mildly  thick-walled bladder. Stomach/Bowel: Stomach is within normal limits. No evidence of bowel obstruction. Normal appendix (series 3/image 66). No colonic wall thickening or inflammatory changes. Vascular/Lymphatic: No evidence of abdominal aortic aneurysm. No suspicious abdominopelvic lymphadenopathy. Reproductive: Uterus is within normal limits. Bilateral ovaries are within normal limits. Other: Trace pelvic ascites. Musculoskeletal: Visualized osseous structures are within normal limits. IMPRESSION: No evidence of bowel obstruction. Normal appendix. No CT findings to account for the patient's abdominal pain. Mildly thick-walled bladder, correlate for cystitis. Moderate bilateral pleural effusions with associated compressive atelectasis in the bilateral lower lobes. Additional patchy anterior right lower lobe opacity, possibly reflecting pneumonia, less likely atelectasis. Electronically Signed   By: Julian Hy M.D.   On: 10/05/2022 01:13      Signature  -   Lala Lund M.D on 10/08/2022 at 10:04 AM   -  To page go to [email or web address]

## 2022-10-08 NOTE — Progress Notes (Addendum)
Attempted to call lab for blood culture withdraw.Could not get any response.

## 2022-10-08 NOTE — Progress Notes (Signed)
    I evaluated the patient's left arm fistula due to worsening fever.  Fistula incision is healing well there is no sign of infection and the fistula in the upper arm.  She is having some pain likely due to thrombophlebitis after thrombosing the fistula.  Would wait on any further access procedures at this time.  Servando Snare

## 2022-10-08 NOTE — Consult Note (Signed)
[facility] Consultation Note  Referring Provider: [facility] Primary Care Physician:  [name], [place] Primary Gastroenterologist:  Dr. Randel Pigg  Reason for Consultation:  Nausea/vomiting  HPI: [name] is a 52 y.o. female consult for nausea/vomiting.  Longstanding history of same with multiple prior endoscopies.  Has followed multiple different GI practices, most recent with [facility] (Dr. Randel Pigg) and referral to tertiary center also (Dr. Carlton Adam, GI motility specialist).  Patient with RSV pneumonia and escalation of CKD and heart failure.  No blood in stool or emesis.  No abdominal pain.   Past Medical History:  Diagnosis Date   Anemia    Anxiety    Arthritis    Asthma    CHF (congestive heart failure) ([place])    Chronic kidney disease    Depression    Diabetic ulcer of left foot ([place]) 05/12/2013   Gastroparesis    Generalized abdominal pain    History of chicken pox    Loss of weight 12/02/2019   Migraines    Mixed hyperlipidemia 09/05/2022   Mood disorder ([place])    anxiety   Myocardial infarction ([place])    Prurigo nodularis    with diabetic dermopathy   Type 1 diabetes, uncontrolled, with neuropathy    [place]   Ulcers of both lower legs ([place]) 02/20/2014    Past Surgical History:  Procedure Laterality Date   BIOPSY  08/11/2020   Procedure: BIOPSY;  Surgeon: Ladene Artist, MD;  Location: [facility];  Service: Endoscopy;;   BIOPSY  04/02/2022   Procedure: BIOPSY;  Surgeon: Jackquline Denmark, MD;  Location: [facility];  Service: Gastroenterology;;   ESOPHAGOGASTRODUODENOSCOPY (EGD) WITH PROPOFOL N/A 08/11/2020   Procedure: ESOPHAGOGASTRODUODENOSCOPY (EGD) WITH PROPOFOL;  Surgeon: Ladene Artist, MD;  Location: [facility];  Service: Endoscopy;  Laterality: N/A;   ESOPHAGOGASTRODUODENOSCOPY (EGD) WITH PROPOFOL N/A 04/02/2022   Procedure: ESOPHAGOGASTRODUODENOSCOPY (EGD) WITH PROPOFOL;  Surgeon: Jackquline Denmark, MD;  Location: [facility];   Service: Gastroenterology;  Laterality: N/A;   ESOPHAGOGASTRODUODENOSCOPY (EGD) WITH PROPOFOL N/A 05/23/2022   Procedure: ESOPHAGOGASTRODUODENOSCOPY (EGD) WITH PROPOFOL;  Surgeon: Lucilla Lame, MD;  Location: [facility] ENDOSCOPY;  Service: Endoscopy;  Laterality: N/A;   ICD IMPLANT N/A 04/09/2022   Procedure: ICD IMPLANT;  Surgeon: Constance Haw, MD;  Location: [facility];  Service: Cardiovascular;  Laterality: N/A;   IR FLUORO GUIDE CV LINE RIGHT  10/07/2022   IR US GUIDE VASC ACCESS RIGHT  10/07/2022   treadmill stress test  01/2013   WNL, low risk study    Prior to Admission medications   Medication Sig Start Date End Date Taking? Authorizing Provider  cloNIDine (CATAPRES - DOSED IN MG/24 HR) 0.1 mg/24hr patch 0.1 mg once a week. 09/27/22  Yes [provider]  acetaminophen (TYLENOL) 500 MG tablet as needed for moderate pain or mild pain.    [provider]  albuterol (PROAIR HFA) 108 (90 Base) MCG/ACT inhaler Inhale 2 puffs into the lungs every 6 (six) hours as needed for wheezing or shortness of breath. 07/03/20   [name], PA-C  Ensure Max Protein (ENSURE MAX PROTEIN) LIQD Take 330 mLs (11 oz total) by mouth daily. 04/30/22   Hongalgi, [name], MD  hydrALAZINE (APRESOLINE) 50 MG tablet Take 1 tablet (50 mg total) by mouth 3 (three) times daily. 07/17/22   Domenic Polite, MD  hydrOXYzine (ATARAX) 50 MG tablet Take 50 mg by mouth in the morning, at noon, and at bedtime.    [provider]  insulin aspart (NOVOLOG) 100 UNIT/ML injection Inject 150 Units into the skin See admin instructions. Patient uses this with the [place]. Units vary according to blood sugar. Loads 150 units every 3 days.    [provider]  LORazepam (ATIVAN) 0.5 MG tablet Take 1 tablet (0.5 mg total) by mouth every 8 (eight) hours as needed for anxiety (Refractory nausea and vomiting). 07/17/22   Domenic Polite, MD  metoCLOPramide (REGLAN) 10 MG tablet Take 1 tablet (10 mg  total) by mouth 3 (three) times daily before meals. 07/17/22   Domenic Polite, MD  metoprolol succinate (TOPROL-XL) 50 MG 24 hr tablet Take 1 tablet (50 mg total) by mouth daily. Take with or immediately following a meal. 09/05/22   Chandrasekhar, Mahesh A, MD  Multiple Vitamin (MULTIVITAMIN WITH MINERALS) TABS tablet Take 1 tablet by mouth daily. 04/30/22   Hongalgi, [name], MD  nitroGLYCERIN (NITROSTAT) 0.4 MG SL tablet Place 1 tablet (0.4 mg total) under the tongue every 5 (five) minutes as needed for chest pain. 09/05/22 12/04/22  Werner Lean, MD  oxyCODONE-acetaminophen (PERCOCET) 5-325 MG tablet Take 1 tablet by mouth every 6 (six) hours as needed for severe pain. 10/01/22   Rosetta Posner, MD  pantoprazole (PROTONIX) 40 MG tablet Take 1 tablet (40 mg total) by mouth 2 (two) times daily before a meal. 05/24/22 09/08/22  Rai, Ripudeep K, MD  polyethylene glycol (MIRALAX / GLYCOLAX) 17 g packet Take 17 g by mouth daily as needed for moderate constipation.    [provider]  pravastatin (PRAVACHOL) 40 MG tablet Take 1 tablet (40 mg total) by mouth every evening. 09/05/22   Chandrasekhar, Lyda Kalata A, MD  pregabalin (LYRICA) 50 MG capsule Take 50 mg by mouth 3 (three) times daily as needed (neuropathy). 12/05/21   [provider]  prochlorperazine (COMPAZINE) 10 MG tablet Take 1 tablet (10 mg total) by mouth every 6 (six) hours as needed for nausea or vomiting. 05/24/22   Rai, [name], MD  promethazine (PHENERGAN) 12.5 MG tablet Take 12.5 mg by mouth 2 (two) times daily as needed for nausea or vomiting.    [provider]  promethazine (PHENERGAN) 25 MG suppository Unwrap and place 1 suppository (25 mg total) rectally every 6 (six) hours as needed for nausea or vomiting. 07/17/22   Domenic Polite, MD  scopolamine (TRANSDERM-SCOP) 1 MG/3DAYS Place 1 patch (1.5 mg total) onto the skin every 3 (three) days. 05/26/22   Rai, Ripudeep K, MD  tobramycin (TOBREX) 0.3 % ophthalmic  solution Place 1-2 drops into both eyes See admin instructions. Instill 1-2 drops into both eyes 3 times daily the day before, day of, and the day after every 6 weeks eye injections per patient 11/17/20   [provider]  torsemide (DEMADEX) 20 MG tablet TAKE THREE TABLETS BY MOUTH DAILY 08/30/22   Clegg, Amy D, NP    Current Facility-Administered Medications  Medication Dose Route Frequency Provider Last Rate Last Admin   acetaminophen (TYLENOL) tablet 650 mg  650 mg Oral Q6H PRN Lucienne Minks, MD   650 mg at 10/08/22 1156   albuterol (PROVENTIL) (2.5 MG/3ML) 0.083% nebulizer solution 2.5 mg  2.5 mg Inhalation Q6H PRN Thurnell Lose, MD       [START ON 10/11/2022] aspirin EC tablet 81 mg  81 mg Oral Daily Thurnell Lose, MD       Chlorhexidine Gluconate Cloth 2 % PADS 6 each  6 each Topical Q0600 Madelon Lips, MD   6  each at 10/08/22 0628   [START ON 10/09/2022] Darbepoetin Alfa (ARANESP) injection 100 mcg  100 mcg Subcutaneous Q Wed-1800 Madelon Lips, MD       ferric gluconate (FERRLECIT) 250 mg in sodium chloride 0.9 % 250 mL IVPB  250 mg Intravenous Q M,W,F-HD Madelon Lips, MD   Stopped at 10/07/22 2007   furosemide (LASIX) injection 80 mg  80 mg Intravenous BID Rosita Fire, MD   80 mg at 10/08/22 [id number]   hydrALAZINE (APRESOLINE) injection 10 mg  10 mg Intravenous Q6H PRN Thurnell Lose, MD       hydrALAZINE (APRESOLINE) tablet 75 mg  75 mg Oral Q8H Lily Kocher, PA-C   75 mg at 10/08/22 [id number]   insulin aspart (novoLOG) injection 0-5 Units  0-5 Units Subcutaneous QHS Lucienne Minks, MD   4 Units at 10/05/22 0005   insulin aspart (novoLOG) injection 0-9 Units  0-9 Units Subcutaneous TID WC Lucienne Minks, MD   2 Units at 10/08/22 0815   insulin glargine-yfgn (SEMGLEE) injection 10 Units  10 Units Subcutaneous QHS Lucienne Minks, MD   10 Units at 10/07/22 2125   isosorbide dinitrate (ISORDIL) tablet 20 mg  20 mg Oral TID Rudean Haskell A, MD   20 mg at  10/08/22 0817   levalbuterol (XOPENEX) nebulizer solution 0.63 mg  0.63 mg Nebulization BID Lucienne Minks, MD   0.63 mg at 10/08/22 0756   LORazepam (ATIVAN) injection 0.5 mg  0.5 mg Intravenous Q6H PRN Lucienne Minks, MD   0.5 mg at 10/07/22 2141   [START ON 10/09/2022] metoprolol succinate (TOPROL-XL) 24 hr tablet 50 mg  50 mg Oral Daily Lily Kocher, PA-C       nitroGLYCERIN (NITROSTAT) SL tablet 0.4 mg  0.4 mg Sublingual Q5 min PRN Thurnell Lose, MD       oxyCODONE-acetaminophen (PERCOCET/ROXICET) 5-325 MG per tablet 1 tablet  1 tablet Oral Q6H PRN Thurnell Lose, MD       pantoprazole (PROTONIX) injection 40 mg  40 mg Intravenous Q12H Thurnell Lose, MD   40 mg at 10/08/22 0817   pravastatin (PRAVACHOL) tablet 40 mg  40 mg Oral q1800 Lucienne Minks, MD   40 mg at 10/07/22 2124   prochlorperazine (COMPAZINE) injection 5 mg  5 mg Intravenous Q6H PRN Lucienne Minks, MD   5 mg at 10/07/22 0334   scopolamine (TRANSDERM-SCOP) 1 MG/3DAYS 1.5 mg  1 patch Transdermal Q72H Lucienne Minks, MD   1.5 mg at 10/07/22 2124   sodium bicarbonate tablet 1,300 mg  1,300 mg Oral BID Rosita Fire, MD   1,300 mg at 10/08/22 0817   tobramycin (TOBREX) 0.3 % ophthalmic solution 1-2 drop  1-2 drop Both Eyes See admin instructions Thurnell Lose, MD       vancomycin (VANCOREADY) IVPB 1500 mg/300 mL  1,500 mg Intravenous Once Thurnell Lose, MD 150 mL/hr at 10/08/22 1213 1,500 mg at 10/08/22 1213   vancomycin variable dose per unstable renal function (pharmacist dosing)   Does not apply See admin instructions Thurnell Lose, MD        Allergies as of 10/04/2022 - Review Complete 10/04/2022  Allergen Reaction Noted   Atorvastatin Other (See Comments) 04/23/2021   Dulaglutide Nausea And Vomiting and Other (See Comments) 05/23/2020   Sertraline  09/05/2022   Zofran [ondansetron] Nausea And Vomiting and Other (See Comments) 04/24/2022   Amoxicillin Itching and Rash 11/25/2017   Lantus [insulin  glargine] Itching and Rash 11/25/2017  Miconazole nitrate Nausea And Vomiting and Rash 05/23/2020    Family History  Problem Relation Age of Onset   Diabetes Mother        type 2   Hypertension Mother    Thyroid disease Mother    Bipolar disorder Mother    Heart disease Mother    Calcium disorder Mother    Cancer Maternal Grandmother        Breast, stomach   Cancer Paternal Grandmother        stomach, lung (smoker)   Diabetes Paternal Grandmother    Diabetes Paternal Grandfather    Heart failure Sister    Diabetes Sister    Stroke Maternal Aunt    Cancer Maternal Uncle        prostate   CAD Maternal Aunt        stents   Cancer Maternal Aunt 64       ovarian    Social History   Socioeconomic History   Marital status: Divorced    Spouse name: Not on file   Number of children: 2   Years of education: Not on file   Highest education level: Not on file  Occupational History   Occupation: Freight forwarder  Tobacco Use   Smoking status: Never   Smokeless tobacco: Never  Vaping Use   Vaping Use: Never used  Substance and Sexual Activity   Alcohol use: Not Currently   Drug use: No   Sexual activity: Yes    Partners: Male    Birth control/protection: None  Other Topics Concern   Not on file  Social History Narrative   Caffeine use: daily   Occupation: cleans houses   Regular exercise: yes, active 5x/wk   Diet: good water, fruits/vegetables daily   Social Determinants of Health   Financial Resource Strain: Low Risk  (07/15/2022)   Overall Financial Resource Strain (CARDIA)    Difficulty of Paying Living Expenses: Not very hard  Food Insecurity: No Food Insecurity (10/04/2022)   Hunger Vital Sign    Worried About Running Out of Food in the Last Year: Never true    Ran Out of Food in the Last Year: Never true  Transportation Needs: No Transportation Needs (10/04/2022)   PRAPARE - Hydrologist (Medical): No    Lack of Transportation  (Non-Medical): No  Physical Activity: Not on file  Stress: Not on file  Social Connections: Not on file  Intimate Partner Violence: Not At Risk (10/04/2022)   Humiliation, Afraid, Rape, and Kick questionnaire    Fear of Current or Ex-Partner: No    Emotionally Abused: No    Physically Abused: No    Sexually Abused: No    Review of Systems: As per HPI, all others negative  Physical Exam: Vital signs in last 24 hours: Temp:  [98.4 F (36.9 C)-102.8 F (39.3 C)] 102.8 F (39.3 C) (03/05 1200) Pulse Rate:  [89-113] 96 (03/05 1200) Resp:  [18-44] 28 (03/05 1200) BP: (132-190)/(55-80) 148/75 (03/05 1200) SpO2:  [93 %-100 %] 96 % (03/05 1200) Weight:  [81.2 kg-81.9 kg] 81.2 kg (03/04 1845) Last BM Date : 10/08/22 General:   Alert,  older-appearing than stated age, deconditioned-appearing, weak-appearing cooperative in NAD Head:  Normocephalic and atraumatic. Eyes:  Sclera clear, no icterus.   Conjunctiva pink. Ears:  Normal auditory acuity. Nose:  No deformity, discharge,  or lesions. Mouth:  No deformity or lesions.  Oropharynx pink & moist. Neck:  Supple; no masses or thyromegaly. Lungs:  No acute repiratory distress Abdomen:  Soft, nontender and nondistended. No masses, hepatosplenomegaly or hernias noted. Normal bowel sounds, without guarding, and without rebound.     Msk:  Symmetrical without gross deformities. Normal posture. Pulses:  Normal pulses noted. Extremities:  Without clubbing or edema. Neurologic:  Alert and  oriented x4;  grossly normal neurologically. Psych:  Alert and cooperative. Normal mood and affect.   Lab Results: Recent Labs    10/07/22 0256 10/07/22 0754 10/08/22 0606  WBC 5.7 5.1 6.7  HGB 7.2* 7.1* 7.1*  HCT 21.4* 21.2* 21.3*  PLT 208 193 202   BMET Recent Labs    10/06/22 0730 10/07/22 0256 10/08/22 0606  NA 137 133* 134*  K 4.3 3.8 3.7  CL 106 104 100  CO2 16* 21* 19*  GLUCOSE 227* 133* 147*  BUN 48* 57* 46*  CREATININE 4.73* 5.31*  4.62*  CALCIUM 8.0* 7.8* 7.5*   LFT Recent Labs    10/08/22 0606  PROT 5.2*  ALBUMIN 2.3*  AST 59*  ALT 84*  ALKPHOS 149*  BILITOT 0.8   PT/INR No results for input(s): "LABPROT", "INR" in the last 72 hours.  Studies/Results: DG Chest Port 1 View  Result Date: 10/08/2022 CLINICAL DATA:  Shortness of breath and cough. Status post dialysis catheter placement. EXAM: PORTABLE CHEST 1 VIEW COMPARISON:  Single-view of the chest 10/07/2022. FINDINGS: New right IJ approach dialysis catheter is in place. Its tip projects at the superior cavoatrial junction. No pneumothorax. There is cardiomegaly and vascular congestion. Edema seen on the prior examination has resolved. No pleural effusion. IMPRESSION: Dialysis catheter tip projects at the superior cavoatrial junction. Negative for pneumothorax. Cardiomegaly and mild vascular congestion. Aeration is improved compared to yesterday's exam. Electronically Signed   By: Inge Rise M.D.   On: 10/08/2022 08:22   IR Fluoro Guide CV Line Right  Result Date: 10/07/2022 INDICATION: 52 year old female with history of acute on chronic kidney disease requiring initiation of hemodialysis. EXAM: TUNNELED CENTRAL VENOUS HEMODIALYSIS CATHETER PLACEMENT WITH ULTRASOUND AND FLUOROSCOPIC GUIDANCE MEDICATIONS: Vancomycin 1 gm IV . The antibiotic was given in an appropriate time interval prior to skin puncture. ANESTHESIA/SEDATION: Moderate (conscious) sedation was employed during this procedure. A total of Versed 1 mg and Fentanyl 50 mcg was administered intravenously. Moderate Sedation Time: 10 minutes. The patient's level of consciousness and vital signs were monitored continuously by radiology nursing throughout the procedure under my direct supervision. FLUOROSCOPY TIME:  One mGy COMPLICATIONS: None immediate. PROCEDURE: Informed written consent was obtained from the patient after a discussion of the risks, benefits, and alternatives to treatment. Questions  regarding the procedure were encouraged and answered. The right neck and chest were prepped with chlorhexidine in a sterile fashion, and a sterile drape was applied covering the operative field. Maximum barrier sterile technique with sterile gowns and gloves were used for the procedure. A timeout was performed prior to the initiation of the procedure. After creating a small venotomy incision, a 21 gauge micropuncture kit was utilized to access the internal jugular vein. Real-time ultrasound guidance was utilized for vascular access including the acquisition of a permanent ultrasound image documenting patency of the accessed vessel. A Rosen wire was advanced to the level of the IVC and the micropuncture sheath was exchanged for an 8 Fr dilator. A 14.5 French tunneled hemodialysis catheter measuring 19 cm from tip to cuff was tunneled in a retrograde fashion from the anterior chest wall to the venotomy incision. Serial dilation was then performed an a  peel-away sheath was placed. The catheter was then placed through the peel-away sheath with the catheter tip ultimately positioned within the right atrium. Final catheter positioning was confirmed and documented with a spot radiographic image. The catheter aspirates and flushes normally. The catheter was flushed with appropriate volume heparin dwells. The catheter exit site was secured with a 0-Silk retention suture. The venotomy incision was closed with Dermabond. Sterile dressings were applied. The patient tolerated the procedure well without immediate post procedural complication. IMPRESSION: Successful placement of 19 cm tip to cuff tunneled hemodialysis catheter via the right internal jugular vein with catheter tip terminating within the right atrium. The catheter is ready for immediate use. Ruthann Cancer, MD [facility] Electronically Signed   By: Ruthann Cancer M.D.   On: 10/07/2022 16:05   IR US Guide  Vasc Access Right  RESULT     INDICATION: 52 year old female with history of acute on chronic kidney disease requiring initiation of hemodialysis. EXAM: TUNNELED CENTRAL VENOUS HEMODIALYSIS CATHETER PLACEMENT WITH ULTRASOUND AND FLUOROSCOPIC GUIDANCE MEDICATIONS: Vancomycin 1 gm IV . The antibiotic was given in an appropriate time interval prior to skin puncture. ANESTHESIA/SEDATION: Moderate (conscious) sedation was employed during this procedure. A total of Versed 1 mg and Fentanyl 50 mcg was administered intravenously. Moderate Sedation Time: 10 minutes. The patient's level of consciousness and vital signs were monitored continuously by radiology nursing throughout the procedure under my direct supervision. FLUOROSCOPY TIME:  One mGy COMPLICATIONS: None immediate. PROCEDURE: Informed written consent was obtained from the patient after a discussion of the risks, benefits, and alternatives to treatment. Questions regarding the procedure were encouraged and answered. The right neck and chest were prepped with chlorhexidine in a sterile fashion, and a sterile drape was applied covering the operative field. Maximum barrier sterile technique with sterile gowns and gloves were used for the procedure. A timeout was performed prior to the initiation of the procedure. After creating a small venotomy incision, a 21 gauge micropuncture kit was utilized to access the internal jugular vein. Real-time ultrasound guidance was utilized for vascular access including the acquisition of a permanent ultrasound image documenting patency of the accessed vessel. A Rosen wire was advanced to the level of the IVC and the micropuncture sheath was exchanged for an 8 Fr dilator. A 14.5 French tunneled hemodialysis catheter measuring 19 cm from tip to cuff was tunneled in a retrograde fashion from the anterior chest wall to the venotomy incision. Serial dilation was then performed an a peel-away sheath was placed. The catheter was then  placed through the peel-away sheath with the catheter tip ultimately positioned within the right atrium. Final catheter positioning was confirmed and documented with a spot radiographic image. The catheter aspirates and flushes normally. The catheter was flushed with appropriate volume heparin dwells. The catheter exit site was secured with a 0-Silk retention suture. The venotomy incision was closed with Dermabond. Sterile dressings were applied. The patient tolerated the procedure well without immediate post procedural complication. IMPRESSION: Successful placement of 19 cm tip to cuff tunneled hemodialysis catheter via the right internal jugular vein with catheter tip terminating within the right atrium. The catheter is ready for immediate use. Ruthann Cancer, MD [facility] Rad[facility]   DG Chest Port 1 View  Result Date: 10/07/2022 CLINICAL DATA:  Shortness of breath EXAM: PORTABLE CHEST 1 VIEW COMPARISON:  Three days ago FINDINGS: Cardiomegaly and vascular pedicle widening with diffuse interstitial opacity. No air bronchogram. No pneumothorax.  ICD lead into the right ventricle. Artifact from EKG leads. IMPRESSION: Suspect mild CHF. Electronically Signed   By: Jorje Guild M.D.   On: 10/07/2022 06:37   DG Abd Portable 1V  Result Date: 10/07/2022 CLINICAL DATA:  Nausea and vomiting EXAM: PORTABLE ABDOMEN - 1 VIEW COMPARISON:  Abdominal CT from 2 days ago FINDINGS: The bowel gas pattern is normal. No radio-opaque calculi or other significant radiographic abnormality are seen. IMPRESSION: Normal bowel gas pattern. Electronically Signed   By: Jorje Guild M.D.   On: 10/07/2022 06:36    Impression:   Nausea and vomiting.  Known diabetic gastroparesis, but probably acute infectious illness and worsening renal disease and volume overload are contributing. RSV pneumonia. Acute on chronic heart failure.  Plan:   Supportive care. 2.  Advance diet slowly  as tolerated, gastroparesis protocol (small, frequent aliquots of food; minimize/avoid raw/fatty foods). 3.  No GI intervention anticipated. 4.  [facility] will follow from a distance.   LOS: 4 days   Eddy Liszewski M  10/08/2022, 1:12 PM  Cell [phone] If no answer or after 5 PM call [phone]

## 2022-10-08 NOTE — Progress Notes (Signed)
Pharmacy Antibiotic Note  [name] is a 52 y.o. female admitted on 10/04/2022 with diabetic gastroparesis acute on chronic exacerbation due to RSV viral infection.  Pharmacy has been consulted for Vancomycin dosing for sepsis coverage. Tmax 102.8, WBC 6.7. S/p Aztreonam > Ceftriaxone and Doxycycline IV.    AKI on CKD5 > TDC placed 3/4 and hemodialysis begun. Planning 3 hrs HD 3/6. Vanc 1gm IV given prior to [facility] placement 3/4 > estimated level after 2 hrs HD ~10 mcg/ml.    ID consulted and recommends stopping if blood cultures from 3/4 are negative at 48hrs. Fevers felt likely RSV pneumonia vs transfusion reaction.  Plan: Vancomycin 1500 mg IV x 1 today. Will follow up for possible vanc dosing after HD on 3/6.  Follow up culture data, clinical progress. Blood cultures from 3/4 sent at ~9pm  Height: 6' (182.9 cm) Weight: 81.2 kg (179 lb 0.2 oz) IBW/kg (Calculated) : 73.1  Temp (24hrs), Avg:100.4 F (38 C), Min:98.4 F (36.9 C), Max:102.8 F (39.3 C)  Recent Labs  Lab 10/04/22 1000 10/05/22 0242 10/06/22 0730 10/07/22 0256 10/07/22 0754 10/08/22 0606  WBC 7.2 7.1 6.7 5.7 5.1 6.7  CREATININE 3.61* 4.12* 4.73* 5.31*  --  4.62*  LATICACIDVEN 1.2  --   --   --   --   --     Estimated Creatinine Clearance: 16.4 mL/min (A) (by C-G formula based on SCr of 4.62 mg/dL (H)).    Allergies  Allergen Reactions   Atorvastatin Other (See Comments)    Dizziness    Dulaglutide Nausea And Vomiting and Other (See Comments)    Caused pancreatitis    Metoclopramide Other (See Comments)    Possible movement disorder or tardive dyskinesia while on reglan   Sertraline Nausea And Vomiting   Zofran [Ondansetron] Nausea And Vomiting and Other (See Comments)    Causes nausea vomiting to worsen / doesn't really work for patient.   Amoxicillin Itching and Rash   Lantus [Insulin Glargine] Itching and Rash    Has tolerated insulin glargine many times with no reported issues   Miconazole Nitrate  Nausea And Vomiting and Rash    Antimicrobials this admission: Aztreonam 3/2 x 3 doses Ceftriaxone x 1 on 3/3 Doxycycline IV 3/1>>3.4 Vancomycin x 1 on 3/4 pre-TDC placement >> 3/5 >>   Microbiology results: 3/1 RSV: positive 3/1 COVID and flu: negative 3/1 blood: no growth x 4 days to date 3/4 blood: no growth < 12 hrs to date 3/5 blood: sent 3/5 MRSA PCR: negative  Thank you for allowing pharmacy to be a part of this patient's care.  Arty Baumgartner, [place] 10/08/2022 12:41 PM

## 2022-10-08 NOTE — Plan of Care (Signed)
  Problem: Education: Goal: Knowledge of General Education information will improve Description: Including pain rating scale, medication(s)/side effects and non-pharmacologic comfort measures Outcome: Progressing   Problem: Health Behavior/Discharge Planning: Goal: Ability to manage health-related needs will improve Outcome: Progressing   Problem: Clinical Measurements: Goal: Ability to maintain clinical measurements within normal limits will improve Outcome: Progressing Goal: Will remain free from infection Outcome: Progressing Goal: Diagnostic test results will improve Outcome: Progressing Goal: Respiratory complications will improve Outcome: Progressing Goal: Cardiovascular complication will be avoided Outcome: Progressing   Problem: Activity: Goal: Risk for activity intolerance will decrease Outcome: Progressing   Problem: Nutrition: Goal: Adequate nutrition will be maintained Outcome: Progressing   Problem: Coping: Goal: Level of anxiety will decrease Outcome: Progressing   Problem: Elimination: Goal: Will not experience complications related to bowel motility Outcome: Progressing Goal: Will not experience complications related to urinary retention Outcome: Progressing   Problem: Pain Managment: Goal: General experience of comfort will improve Outcome: Progressing   Problem: Safety: Goal: Ability to remain free from injury will improve Outcome: Progressing   Problem: Skin Integrity: Goal: Risk for impaired skin integrity will decrease Outcome: Progressing   Problem: Education: Goal: Ability to describe self-care measures that may prevent or decrease complications (Diabetes Survival Skills Education) will improve Outcome: Progressing Goal: Individualized Educational Video(s) Outcome: Progressing   Problem: Coping: Goal: Ability to adjust to condition or change in health will improve Outcome: Progressing   Problem: Fluid Volume: Goal: Ability to  maintain a balanced intake and output will improve Outcome: Progressing   Problem: Health Behavior/Discharge Planning: Goal: Ability to identify and utilize available resources and services will improve Outcome: Progressing Goal: Ability to manage health-related needs will improve Outcome: Progressing   Problem: Metabolic: Goal: Ability to maintain appropriate glucose levels will improve Outcome: Progressing   Problem: Nutritional: Goal: Maintenance of adequate nutrition will improve Outcome: Progressing Goal: Progress toward achieving an optimal weight will improve Outcome: Progressing   Problem: Skin Integrity: Goal: Risk for impaired skin integrity will decrease Outcome: Progressing   Problem: Tissue Perfusion: Goal: Adequacy of tissue perfusion will improve Outcome: Progressing   Problem: Education: Goal: Knowledge of disease and its progression will improve Outcome: Progressing Goal: Individualized Educational Video(s) Outcome: Progressing   Problem: Fluid Volume: Goal: Compliance with measures to maintain balanced fluid volume will improve Outcome: Progressing   Problem: Health Behavior/Discharge Planning: Goal: Ability to manage health-related needs will improve Outcome: Progressing   Problem: Nutritional: Goal: Ability to make healthy dietary choices will improve Outcome: Progressing   Problem: Clinical Measurements: Goal: Complications related to the disease process, condition or treatment will be avoided or minimized Outcome: Progressing   

## 2022-10-08 NOTE — Consult Note (Signed)
[facility]    Date of Admission:  10/04/2022   Total days of inpatient antibiotics:         Reason for Consult: Fever    Principal Problem:   Hematemesis Active Problems:   Pneumonia due to respiratory syncytial virus (RSV)   NSVT (nonsustained ventricular tachycardia) (HCC)   Chronic combined systolic and diastolic heart failure [facility])   Assessment: 52 year old female with diabetes with gastroparesis, CKD stage IV, hypertension admitted for RSV pneumonia, AKI on CKD.  ID engaged for persistent fevers. Noted to have productive cough.    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  Recent left AVF placed.  Patient found to be uremic with nausea and shakiness, TDC placed on 3/4 patient started on HD -Pt presented with fever which subsided  fro about 24hrs. -She then fevered on 3/4 (100.8 at 1025) after starting PRBC at 1010 on 3/4. HD line placement(1458 on 3/4) -Right chest HD line without signs of skin soft tissue infection -Chest x-ray yesterday showed improved aeration - I suspect fevers are secondary to RSV pneumonia vs possible transfusion reaction. Transfusion reaction can last over 24hrs. Recommendations:  -Stop vancomycin if blood Cx from 3/4 NG x 48H - Lymphopenia noted on admission with a CBC with differential as it is in concordance with viral infection. -If fever persists then engage heme-onc for input for possible transfusion reaction -Follow blood Cx  #DM  -A1c 7.5 on 3/2   I have personally spent 95 minutes involved in face-to-face and non-face-to-face activities for this patient on the day of the visit. Professional time spent includes the following activities: Preparing to see the patient (review of tests), Obtaining and/or reviewing separately obtained history  (admission/discharge record), Performing a medically appropriate examination and/or evaluation , Ordering medications/tests/procedures, referring and communicating with other health care professionals, Documenting clinical information in the EMR, Independently interpreting results (not separately reported), Communicating results to the patient/family/caregiver, Counseling and educating the patient/family/caregiver and Care coordination (not separately reported).   Microbiology:   Antibiotics: Vancomycin 3/4- Doxy 3/1-3/3  Cultures: Blood 3/1 Ng 3/4 NG   3/1 RSV  PCR+   HPI: [name] is a 52 y.o. female with past medical history for diabetes with gastroparesis, CKD stage IV, hypertension presented for nausea and vomiting that started around 2 AM of admission.  She thought her symptoms were secondary to gastroparesis.  RSV at [facility] ED was positive.  Patient reported productive cough brown sputum.On arrival patient had temp of 101.3 WBC 7K.  Chest x-ray showed new bilateral perihilar and bibasilar air space opacity representative atypical infection versus pulmonary edema patient was admitted for RSV pneumonia, AKI on CKD   Review of Systems: Review of Systems  All other systems reviewed and are negative.   Past Medical History:  Diagnosis Date   Anemia    Anxiety    Arthritis    Asthma    CHF (congestive heart failure) ([place])    Chronic kidney disease    Depression    Diabetic ulcer of left foot ([place]) 05/12/2013   Gastroparesis    Generalized abdominal pain    History of chicken pox    Loss of weight 12/02/2019   Migraines    Mixed hyperlipidemia 09/05/2022   Mood disorder (HCC)    anxiety   Myocardial  infarction ([place])    Prurigo nodularis    with diabetic dermopathy   Type 1 diabetes, uncontrolled, with neuropathy    [place]   Ulcers of both lower legs ([place]) 02/20/2014    Social History   Tobacco Use   Smoking status: Never   Smokeless tobacco: Never   Vaping Use   Vaping Use: Never used  Substance Use Topics   Alcohol use: Not Currently   Drug use: No    Family History  Problem Relation Age of Onset   Diabetes Mother        type 2   Hypertension Mother    Thyroid disease Mother    Bipolar disorder Mother    Heart disease Mother    Calcium disorder Mother    Cancer Maternal Grandmother        Breast, stomach   Cancer Paternal Grandmother        stomach, lung (smoker)   Diabetes Paternal Grandmother    Diabetes Paternal Grandfather    Heart failure Sister    Diabetes Sister    Stroke Maternal Aunt    Cancer Maternal Uncle        prostate   CAD Maternal Aunt        stents   Cancer Maternal Aunt 64       ovarian   Scheduled Meds:  [START ON 10/11/2022] aspirin EC  81 mg Oral Daily   Chlorhexidine Gluconate Cloth  6 each Topical Q0600   furosemide  80 mg Intravenous BID   hydrALAZINE  75 mg Oral Q8H   insulin aspart  0-5 Units Subcutaneous QHS   insulin aspart  0-9 Units Subcutaneous TID WC   insulin glargine-yfgn  10 Units Subcutaneous QHS   isosorbide dinitrate  20 mg Oral TID   levalbuterol  0.63 mg Nebulization BID   [START ON 10/09/2022] metoprolol succinate  50 mg Oral Daily   pantoprazole (PROTONIX) IV  40 mg Intravenous Q12H   pravastatin  40 mg Oral q1800   scopolamine  1 patch Transdermal Q72H   sodium bicarbonate  1,300 mg Oral BID   tobramycin  1-2 drop Both Eyes See admin instructions   Continuous Infusions:  ferric gluconate (FERRLECIT) IVPB Stopped (10/07/22 2007)   PRN Meds:.acetaminophen, albuterol, hydrALAZINE, LORazepam, nitroGLYCERIN, oxyCODONE-acetaminophen, prochlorperazine Allergies  Allergen Reactions   Atorvastatin Other (See Comments)    Dizziness    Dulaglutide Nausea And Vomiting and Other (See Comments)    Caused pancreatitis    Metoclopramide Other (See Comments)    Possible movement disorder or tardive dyskinesia while on reglan   Sertraline Nausea And Vomiting   Zofran  [Ondansetron] Nausea And Vomiting and Other (See Comments)    Causes nausea vomiting to worsen / doesn't really work for patient.   Amoxicillin Itching and Rash   Lantus [Insulin Glargine] Itching and Rash    Has tolerated insulin glargine many times with no reported issues   Miconazole Nitrate Nausea And Vomiting and Rash    OBJECTIVE: Blood pressure (!) 132/55, pulse 96, temperature (!) 101 F (38.3 C), temperature source Oral, resp. rate (!) 21, height 6' (1.829 m), weight 81.2 kg, last menstrual period 11/17/2021, SpO2 99 %.  Physical Exam Constitutional:      Appearance: Normal appearance.  HENT:     Head: Normocephalic and atraumatic.     Right Ear: Tympanic membrane normal.     Left Ear: Tympanic membrane normal.     Nose: Nose normal.     Mouth/Throat:  Mouth: Mucous membranes are moist.  Eyes:     Extraocular Movements: Extraocular movements intact.     Conjunctiva/sclera: Conjunctivae normal.     Pupils: Pupils are equal, round, and reactive to light.  Cardiovascular:     Rate and Rhythm: Normal rate and regular rhythm.     Heart sounds: No murmur heard.    No friction rub. No gallop.  Pulmonary:     Effort: Pulmonary effort is normal.     Breath sounds: Normal breath sounds.  Abdominal:     General: Abdomen is flat.     Palpations: Abdomen is soft.  Musculoskeletal:        General: Normal range of motion.  Skin:    General: Skin is warm and dry.  Neurological:     General: No focal deficit present.     Mental Status: She is alert and oriented to person, place, and time.  Psychiatric:        Mood and Affect: Mood normal.     Lab Results Lab Results  Component Value Date   WBC 6.7 10/08/2022   HGB 7.1 (L) 10/08/2022   HCT 21.3 (L) 10/08/2022   MCV 87.3 10/08/2022   PLT 202 10/08/2022    Lab Results  Component Value Date   CREATININE 4.62 (H) 10/08/2022   BUN 46 (H) 10/08/2022   NA 134 (L) 10/08/2022   K 3.7 10/08/2022   CL 100 10/08/2022    CO2 19 (L) 10/08/2022    Lab Results  Component Value Date   ALT 84 (H) 10/08/2022   AST 59 (H) 10/08/2022   ALKPHOS 149 (H) 10/08/2022   BILITOT 0.8 10/08/2022       Laurice Record, [facility] 10/08/2022, 11:40 AM

## 2022-10-08 NOTE — Progress Notes (Signed)
  Attempted to call lab for blood culture withdraw three times.Could not  get any response.

## 2022-10-08 NOTE — Progress Notes (Addendum)
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 Recent Left AVF placed by Dr. Donnetta Hutching.  Recent progressive CKD in the setting of multiple hospitalization, ongoing gastroparesis flare etc.  She is on antibiotics for pneumonia.   - having some uremic symptoms- nausea and shakiness - started dialysis 10/07/22 - TDC placed by IR, appreciate assistance - HD #1 3/4 after [facility] and also Hep B - appreciate VVS eval of thrombosed AVF - HD #2 10/09/22 - CLIP in process   # Possible pneumonia/acute hypoxic respiratory failure/RSV+: On ceftriaxone and Doxy by primary team.  Currently on oxygen.  Hopefully IV Lasix will help for volume management.   # Acute on chronic diastolic CHF with fluid overload/pleural effusion: IV diuretics.  Echo with EF of 40 to 45%, mild concentric LVH.  Noted cardiology is consulted.   # Metabolic acidosis: Will add sodium bicarbonate.   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  Corrected calcium and phosphorus level acceptable for CKD.   # Anemia of CKD: - started iron load 3/4 - reorder ESA   # Hypertension/volume: Currently on cardiac medication including metoprolol, Imdur, hydralazine.  Diuretics as above.  Blood pressure acceptable.  #fevers: - blood cultures 3/5 - timing appears c/w transfusion reaction - closely monitor on dialysis and the new catheter    Subjective:    Had HD #1 yesterday.  Had mild transfusion reaction yesterday, spiked fevers after dialysis too- primary d/w pathology- labs look c/w mild transfusion reaction.  Feels tired today.     Objective:   BP (!) 132/55 (BP Location: Right Leg)   Pulse 96   Temp (!) 101 F (38.3 C) (Oral)   Resp (!) 21   Ht 6' (1.829 m)   Wt 81.2 kg   LMP 11/17/2021 (Approximate)   SpO2 99%   BMI 24.28 kg/m   Physical Exam: [id number] uncomfortable this AM but slightly  better CVS: RRR Resp: bilateral wheezing Abd: soft Ext: 1+ Le edema ACCESS: L AVF thrombosed  Labs: BMET Recent Labs  Lab 10/04/22 1000 10/05/22 0242 10/06/22 0730 10/07/22 0256 10/08/22 0606  NA 140 140 137 133* 134*  K 4.1 3.8 4.3 3.8 3.7  CL 107 109 106 104 100  CO2 23 22 16* 21* 19*  GLUCOSE 288* 184* 227* 133* 147*  BUN 37* 39* 48* 57* 46*  CREATININE 3.61* 4.12* 4.73* 5.31* 4.62*  CALCIUM 8.9 8.0* 8.0* 7.8* 7.5*  PHOS  --  3.8 5.4* 5.9*  --    CBC Recent Labs  Lab 10/04/22 1000 10/05/22 0242 10/06/22 0730 10/07/22 0256 10/07/22 0754 10/08/22 0606  WBC 7.2   < > 6.7 5.7 5.1 6.7  NEUTROABS 6.5  --   --   --   --  5.7  HGB 9.4*   < > 7.4* 7.2* 7.1* 7.1*  HCT 28.6*   < > 23.4* 21.4* 21.2* 21.3*  MCV 86.9   < > 89.7 87.7 87.6 87.3  PLT 288   < > 207 208 193 202   < > = values in this interval not displayed.      Medications:     [START ON 10/11/2022] aspirin EC  81 mg Oral Daily   Chlorhexidine Gluconate Cloth  6 each Topical Q0600   furosemide  80 mg Intravenous BID   hydrALAZINE  75 mg Oral Q8H   insulin aspart  0-5  Units Subcutaneous QHS   insulin aspart  0-9 Units Subcutaneous TID WC   insulin glargine-yfgn  10 Units Subcutaneous QHS   isosorbide dinitrate  20 mg Oral TID   levalbuterol  0.63 mg Nebulization BID   [START ON 10/09/2022] metoprolol succinate  50 mg Oral Daily   pantoprazole (PROTONIX) IV  40 mg Intravenous Q12H   pravastatin  40 mg Oral q1800   scopolamine  1 patch Transdermal Q72H   sodium bicarbonate  1,300 mg Oral BID   tobramycin  1-2 drop Both Eyes See admin instructions     Madelon Lips MD 10/08/2022, 11:51 AM

## 2022-10-08 NOTE — Progress Notes (Signed)
   10/08/22 1200  Assess: MEWS Score  Temp (!) 102.8 F (39.3 C) (MD aware)  BP (!) 148/75  MAP (mmHg) 96  Pulse Rate 96  ECG Heart Rate 97  Resp (!) 28  SpO2 96 %  O2 Device Nasal Cannula  O2 Flow Rate (L/min) (S)  3 L/min  Assess: MEWS Score  MEWS Temp 2  MEWS Systolic 0  MEWS Pulse 0  MEWS RR 2  MEWS LOC 0  MEWS Score 4  MEWS Score Color Red  Assess: if the MEWS score is Yellow or Red  Were vital signs taken at a resting state? Yes  Focused Assessment Change from prior assessment (see assessment flowsheet)  Does the patient meet 2 or more of the SIRS criteria? Yes  Does the patient have a confirmed or suspected source of infection? Yes  Provider and Rapid Response Notified? Yes  MEWS guidelines implemented  Yes, red  Treat  MEWS Interventions Considered administering scheduled or prn medications/treatments as ordered  Take Vital Signs  Increase Vital Sign Frequency  Red: Q1hr x2, continue Q4hrs until patient remains green for 12hrs  Escalate  MEWS: Escalate Red: Discuss with charge nurse and notify provider. Consider notifying RRT. If remains red for 2 hours consider need for higher level of care  Notify: Charge Nurse/RN  Name of Charge Nurse/RN Notified [place]  Provider Notification  Provider Name/Title Lala Lund K,MD  Date Provider Notified 10/08/22  Time Provider Notified 1200  Method of Notification Page (Secure chat)  Notification Reason Change in status  Provider response See new orders  Date of Provider Response 10/08/22  Time of Provider Response 1201  Assess: SIRS CRITERIA  SIRS Temperature  1  SIRS Pulse 1  SIRS Respirations  1  SIRS WBC 0  SIRS Score Sum  3

## 2022-10-08 NOTE — Progress Notes (Addendum)
[facility] feels weak and now feels feverish   Objective (!) 132/55 96 (!) 101 F (38.3 C) (Oral) (!) 21 97%  Intake/Output Summary (Last 24 hours) at 10/08/2022 0737 Last data filed at 10/07/2022 1832 Gross per 24 hour  Intake --  Output 800 ml  Net -800 ml    Very warm to touch B UE palpable radial pulses, grip 5/5 grossly equal Lungs non labored breathing General appears ill  Assessment/Planning: Patient recently underwent left first stage brachiobasilic fistula with Dr. Donnetta Hutching on 10/01/2022.   Duplex confirms this is thrombosed.  RSV pneumonia   She appears and feels ill.  Tm 102.7 Dr. Carlis Abbott will discuss new access options and plan surgery once she has improved.  She will likely need an AV graft on the left.  Roxy Horseman 10/08/2022 7:37 AM --  Laboratory Lab Results: Recent Labs    10/07/22 0754 10/08/22 0606  WBC 5.1 6.7  HGB 7.1* 7.1*  HCT 21.2* 21.3*  PLT 193 202   BMET Recent Labs    10/06/22 0730 10/07/22 0256  NA 137 133*  K 4.3 3.8  CL 106 104  CO2 16* 21*  GLUCOSE 227* 133*  BUN 48* 57*  CREATININE 4.73* 5.31*  CALCIUM 8.0* 7.8*    COAG Lab Results  Component Value Date   INR 1.0 08/27/2022   INR 0.9 04/21/2022   INR 0.9 04/01/2022   No results found for: "PTT"  I have seen and evaluated the patient. I agree with the PA note as documented above.  Her left first stage basilic vein fistula is clotted that Dr. Donnetta Hutching placed in [place].  All of her surface veins are small.  Next option would be AV graft.  She is now febrile.  I would delay any further access placement at this time given requirement for prosthetic AV graft and high risk of infection.  Marty Heck, MD [facility] Office: [phone]

## 2022-10-08 NOTE — Progress Notes (Addendum)
Rounding Note    Patient Name: [name] Date of Encounter: 10/08/2022  [place] Cardiologist: Werner Lean, MD   Subjective   Patient is ill appearing today. She continues to feel poorly today with fever, chills, myalgias. Also endorses intermittently "labored" breathing. Denies chest pain or palpitations.   Inpatient Medications    Scheduled Meds:  [START ON 10/11/2022] aspirin EC  81 mg Oral Daily   Chlorhexidine Gluconate Cloth  6 each Topical Q0600   furosemide  80 mg Intravenous BID   hydrALAZINE  75 mg Oral Q8H   insulin aspart  0-5 Units Subcutaneous QHS   insulin aspart  0-9 Units Subcutaneous TID WC   insulin glargine-yfgn  10 Units Subcutaneous QHS   isosorbide dinitrate  20 mg Oral TID   levalbuterol  0.63 mg Nebulization BID   metoprolol succinate  50 mg Oral Daily   pantoprazole (PROTONIX) IV  40 mg Intravenous Q12H   pravastatin  40 mg Oral q1800   scopolamine  1 patch Transdermal Q72H   sodium bicarbonate  1,300 mg Oral BID   tobramycin  1-2 drop Both Eyes See admin instructions   Continuous Infusions:  ferric gluconate (FERRLECIT) IVPB Stopped (10/07/22 2007)   PRN Meds: acetaminophen, albuterol, hydrALAZINE, LORazepam, nitroGLYCERIN, oxyCODONE-acetaminophen, prochlorperazine   Vital Signs    Vitals:   10/08/22 0400 10/08/22 0626 10/08/22 0731 10/08/22 0757  BP: (!) 152/69 (!) 152/69 (!) 132/55   Pulse: 99  96   Resp: 20  (!) 21   Temp: 99.3 F (37.4 C) (!) 102.6 F (39.2 C) (!) 101 F (38.3 C)   TempSrc: Oral Oral Oral   SpO2:    99%  Weight:      Height:        Intake/Output Summary (Last 24 hours) at 10/08/2022 0832 Last data filed at 10/07/2022 1832 Gross per 24 hour  Intake --  Output 800 ml  Net -800 ml      10/07/2022    6:45 PM 10/07/2022    4:15 PM 10/04/2022    5:36 PM  Last 3 Weights  Weight (lbs) 179 lb 0.2 oz 180 lb 8.9 oz 179 lb 7.3 oz  Weight (kg) 81.2 kg 81.9 kg 81.4 kg      Telemetry    Sinus  rhythm/sinus tachycardia - Personally Reviewed  ECG    No new tracing - Personally Reviewed  Physical Exam   GEN: No acute distress but ill appearing.   Neck: No JVD Cardiac: RRR, no murmurs, rubs, or gallops.  Respiratory: diffuse upper lobe inspiratory wheezing. No lower lobe rales/crackles. Generally diminished breath sounds. GI: Soft, nontender, non-distended  MS: No edema; No deformity. Neuro:  Nonfocal  Psych: Normal affect   Labs    High Sensitivity Troponin:   Recent Labs  Lab 10/04/22 1000 10/04/22 1126  TROPONINIHS 24* 26*     Chemistry Recent Labs  Lab 10/06/22 0730 10/07/22 0256 10/08/22 0606  NA 137 133* 134*  K 4.3 3.8 3.7  CL 106 104 100  CO2 16* 21* 19*  GLUCOSE 227* 133* 147*  BUN 48* 57* 46*  CREATININE 4.73* 5.31* 4.62*  CALCIUM 8.0* 7.8* 7.5*  MG 1.7 1.8 1.6*  PROT 5.2* 5.3* 5.2*  ALBUMIN 2.2* 2.3* 2.3*  AST 42* 125* 59*  ALT 36 96* 84*  ALKPHOS 136* 190* 149*  BILITOT 0.7 0.6 0.8  GFRNONAA 11* 9* 11*  ANIONGAP '15 8 15    '$ Lipids No results for input(s): "  CHOL", "TRIG", "HDL", "LABVLDL", "LDLCALC", "CHOLHDL" in the last 168 hours.  Hematology Recent Labs  Lab 10/07/22 0256 10/07/22 0754 10/08/22 0606  WBC 5.7 5.1 6.7  RBC 2.44* 2.42* 2.44*  HGB 7.2* 7.1* 7.1*  HCT 21.4* 21.2* 21.3*  MCV 87.7 87.6 87.3  MCH 29.5 29.3 29.1  MCHC 33.6 33.5 33.3  RDW 15.7* 15.6* 15.7*  PLT 208 193 202   Thyroid No results for input(s): "TSH", "FREET4" in the last 168 hours.  BNP Recent Labs  Lab 10/04/22 1000 10/08/22 0606  BNP 1,496.7* 2,182.7*    DDimer  Recent Labs  Lab 10/04/22 1000  DDIMER 6.70*     Radiology    DG Chest Port 1 View  Result Date: 10/08/2022 CLINICAL DATA:  Shortness of breath and cough. Status post dialysis catheter placement. EXAM: PORTABLE CHEST 1 VIEW COMPARISON:  Single-view of the chest 10/07/2022. FINDINGS: New right IJ approach dialysis catheter is in place. Its tip projects at the superior cavoatrial  junction. No pneumothorax. There is cardiomegaly and vascular congestion. Edema seen on the prior examination has resolved. No pleural effusion. IMPRESSION: Dialysis catheter tip projects at the superior cavoatrial junction. Negative for pneumothorax. Cardiomegaly and mild vascular congestion. Aeration is improved compared to yesterday's exam. Electronically Signed   By: Inge Rise M.D.   On: 10/08/2022 08:22   IR Fluoro Guide CV Line Right  Result Date: 10/07/2022 INDICATION: 52 year old female with history of acute on chronic kidney disease requiring initiation of hemodialysis. EXAM: TUNNELED CENTRAL VENOUS HEMODIALYSIS CATHETER PLACEMENT WITH ULTRASOUND AND FLUOROSCOPIC GUIDANCE MEDICATIONS: Vancomycin 1 gm IV . The antibiotic was given in an appropriate time interval prior to skin puncture. ANESTHESIA/SEDATION: Moderate (conscious) sedation was employed during this procedure. A total of Versed 1 mg and Fentanyl 50 mcg was administered intravenously. Moderate Sedation Time: 10 minutes. The patient's level of consciousness and vital signs were monitored continuously by radiology nursing throughout the procedure under my direct supervision. FLUOROSCOPY TIME:  One mGy COMPLICATIONS: None immediate. PROCEDURE: Informed written consent was obtained from the patient after a discussion of the risks, benefits, and alternatives to treatment. Questions regarding the procedure were encouraged and answered. The right neck and chest were prepped with chlorhexidine in a sterile fashion, and a sterile drape was applied covering the operative field. Maximum barrier sterile technique with sterile gowns and gloves were used for the procedure. A timeout was performed prior to the initiation of the procedure. After creating a small venotomy incision, a 21 gauge micropuncture kit was utilized to access the internal jugular vein. Real-time ultrasound guidance was utilized for vascular access including the acquisition of a  permanent ultrasound image documenting patency of the accessed vessel. A Rosen wire was advanced to the level of the IVC and the micropuncture sheath was exchanged for an 8 Fr dilator. A 14.5 French tunneled hemodialysis catheter measuring 19 cm from tip to cuff was tunneled in a retrograde fashion from the anterior chest wall to the venotomy incision. Serial dilation was then performed an a peel-away sheath was placed. The catheter was then placed through the peel-away sheath with the catheter tip ultimately positioned within the right atrium. Final catheter positioning was confirmed and documented with a spot radiographic image. The catheter aspirates and flushes normally. The catheter was flushed with appropriate volume heparin dwells. The catheter exit site was secured with a 0-Silk retention suture. The venotomy incision was closed with Dermabond. Sterile dressings were applied. The patient tolerated the procedure well without immediate post procedural  complication. IMPRESSION: Successful placement of 19 cm tip to cuff tunneled hemodialysis catheter via the right internal jugular vein with catheter tip terminating within the right atrium. The catheter is ready for immediate use. Ruthann Cancer, MD [facility] Radiology Electronically Signed   By: Ruthann Cancer M.D.   On: 10/07/2022 16:05   IR US Guide Vasc Access Right  RESULT     INDICATION: 52 year old female with history of acute on chronic kidney disease requiring initiation of hemodialysis. EXAM: TUNNELED CENTRAL VENOUS HEMODIALYSIS CATHETER PLACEMENT WITH ULTRASOUND AND FLUOROSCOPIC GUIDANCE MEDICATIONS: Vancomycin 1 gm IV . The antibiotic was given in an appropriate time interval prior to skin puncture. ANESTHESIA/SEDATION: Moderate (conscious) sedation was employed during this procedure. A total of Versed 1 mg and Fentanyl 50 mcg was administered intravenously. Moderate Sedation Time: 10 minutes. The  patient's level of consciousness and vital signs were monitored continuously by radiology nursing throughout the procedure under my direct supervision. FLUOROSCOPY TIME:  One mGy COMPLICATIONS: None immediate. PROCEDURE: Informed written consent was obtained from the patient after a discussion of the risks, benefits, and alternatives to treatment. Questions regarding the procedure were encouraged and answered. The right neck and chest were prepped with chlorhexidine in a sterile fashion, and a sterile drape was applied covering the operative field. Maximum barrier sterile technique with sterile gowns and gloves were used for the procedure. A timeout was performed prior to the initiation of the procedure. After creating a small venotomy incision, a 21 gauge micropuncture kit was utilized to access the internal jugular vein. Real-time ultrasound guidance was utilized for vascular access including the acquisition of a permanent ultrasound image documenting patency of the accessed vessel. A Rosen wire was advanced to the level of the IVC and the micropuncture sheath was exchanged for an 8 Fr dilator. A 14.5 French tunneled hemodialysis catheter measuring 19 cm from tip to cuff was tunneled in a retrograde fashion from the anterior chest wall to the venotomy incision. Serial dilation was then performed an a peel-away sheath was placed. The catheter was then placed through the peel-away sheath with the catheter tip ultimately positioned within the right atrium. Final catheter positioning was confirmed and documented with a spot radiographic image. The catheter aspirates and flushes normally. The catheter was flushed with appropriate volume heparin dwells. The catheter exit site was secured with a 0-Silk retention suture. The venotomy incision was closed with Dermabond. Sterile dressings were applied. The patient tolerated the procedure well without immediate post procedural complication. IMPRESSION: Successful placement  of 19 cm tip to cuff tunneled hemodialysis catheter via the right internal jugular vein with catheter tip terminating within the right atrium. The catheter is ready for immediate use. Ruthann Cancer, MD [facility] Radiology   DG Chest Port 1 View  Result Date: 10/07/2022 CLINICAL DATA:  Shortness of breath EXAM: PORTABLE CHEST 1 VIEW COMPARISON:  Three days ago FINDINGS: Cardiomegaly and vascular pedicle widening with diffuse interstitial opacity. No air bronchogram. No pneumothorax. ICD lead into the right ventricle. Artifact from EKG leads. IMPRESSION: Suspect mild CHF. Electronically Signed   By: Jorje Guild M.D.   On: 10/07/2022 06:37   DG Abd Portable 1V  Result Date: 10/07/2022 CLINICAL DATA:  Nausea and vomiting EXAM: PORTABLE ABDOMEN - 1 VIEW COMPARISON:  Abdominal CT from 2 days ago FINDINGS: The bowel gas pattern is normal. No radio-opaque calculi or other significant radiographic abnormality are seen. IMPRESSION: Normal bowel gas pattern. Electronically Signed  By: Jorje Guild M.D.   On: 10/07/2022 06:36   VAS US DUPLEX DIALYSIS ACCESS (AVF, AVG)  Result Date: 10/06/2022 DIALYSIS ACCESS Patient Name:  YASAMAN KOZUB  Date of Exam:   10/06/2022 Medical Rec #: [id number]       Accession #:    [id number] Date of Birth: 04/05/1971      Patient Gender: F Patient Age:   52 years Exam Location:  [facility] Procedure:      VAS US DUPLEX DIALYSIS ACCESS (AVF, AVG) Referring Phys: Lawson Radar --------------------------------------------------------------------------------  Reason for Exam: No palpable thrill for AVF/AVG. Access Site: Left Upper Extremity. Access Type: Brachio-basilic, status post first stage 10/01/22. Comparison Study: No prior study Performing Technologist: Sharion Dove RVS  Examination Guidelines: A complete evaluation includes B-mode imaging, spectral Doppler, color Doppler, and power Doppler as needed of all accessible  portions of each vessel. Unilateral testing is considered an integral part of a complete examination. Limited examinations for reoccurring indications may be performed as noted.    Summary: Brachiobasilic AVF appears thrombosed  *See table(s) above for measurements and observations.  Diagnosing physician: Monica Martinez MD Electronically signed by Monica Martinez MD on 10/06/2022 at 1:02:35 PM.   --------------------------------------------------------------------------------   Final     Cardiac Studies   Coronary CTA 01/19/2020: Impressions: 1. Coronary calcium score of 0. 2. Normal coronary origin with right dominance. 3. Normal coronary arteries. 4. Severe biatrial enlargement. _______________   Echocardiogram 10/05/2022: Impressions:  1. Left ventricular ejection fraction, by estimation, is 40 to 45%. The  left ventricle has mildly decreased function. The left ventricle  demonstrates global hypokinesis. There is mild concentric left ventricular  hypertrophy. Left ventricular diastolic  parameters are consistent with Grade III diastolic dysfunction  (restrictive). Elevated left atrial pressure.   2. Right ventricular systolic function is mildly reduced. The right  ventricular size is moderately enlarged. There is moderately elevated  pulmonary artery systolic pressure.   3. Left atrial size was moderately dilated.   4. Right atrial size was moderately dilated.   5. The mitral valve is normal in structure. No evidence of mitral valve  regurgitation. No evidence of mitral stenosis.   6. Tricuspid valve regurgitation is moderate to severe.   7. The aortic valve is tricuspid. Aortic valve regurgitation is not  visualized. No aortic stenosis is present.   8. The inferior vena cava is dilated in size with <50% respiratory  variability, suggesting right atrial pressure of 15 mmHg.   Comparison(s): Prior images reviewed side by side. The left ventricular  function is significantly worse.  The right ventricular systolic function  is worse. TR severity and PA hypertension are also worse, with evidence of  hypervolemia.   Patient Profile     Adelia Borden is a 52 y.o. 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 Feliberto Gottron.    Assessment & Plan    Chest pain Coronary microvascular disease   Patient recently reported chest pain in clinic appointment with Dr. Gasper Sells, started on Imdur. Admitted on 3/1 with abdominal pain and hematemesis. Noted to have elevated troponin 24, 26. ECG with stable T wave inversion in V4-V6. Echo  with no WMA.    No evidence of ACS at this time. Patient now on Isordil '20mg'$  with good response, no recurrent chest pain (has Imdur listed as PTA med but was not taking).  Will continue chest pain workup in outpatient setting Continue pravastatin '40mg'$ .   Chronic HFmrEF   Patient with EF previously down to 40-45% had EF recovery to 50-55%. Repeat TTE this admission now shows LVEF back in the range of 40-45%. BNP elevated to 1496 and chest x-ray with bilateral perihilar and bibasilar opacities.    On physical exam, patient appears to be generally euvolemic though repeat BNP increased from 1496.7->2182.7. Suspect pneumonia to be primary factor behind dyspnea at this time. Continue GDMT of Hydralazine (increased to '75mg'$  TID), Isordil '20mg'$  TID, Metoprolol Succinate. Will hold off on increasing metoprolol due to concerns of worsening infection/possible bacteremia.  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     Dyspnea RSV pneumonia  Patient with fever, chills, myalgias x2 days now.  Initially attributed to yesterday's transfusion. Now concern for progressing infection. In the setting of new HD, persistent fever/chills/myalgias concerning. Procalcitonin and blood cultures are pending.   Hypertension   Hypertension slightly worse today following initial improving on admission. As above, Hydralazine increased.   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 Dialysis initiated yesterday 3/4 with 829m removed.    VF arrest   Patient with ICD following VF arrest in September of 2023. Interrogation this admission shows appropriate function.   Per primary team:   RSV pneumonia Transfusion reaction Gastroparesis DM type I     For questions or updates, please contact CRousePlease consult [email or web address] for contact info under        Signed, ELily Kocher PA-C  10/08/2022, 8:32 AM    Personally seen and examined. Agree with APP above with the following comments: Feels chills and rigors despite transfusion rx therapy. BBoulevarddrawn in the room on exam; she is pending IV Vanc. No further NSVT. Device is functional appropriately One episode of SVT this AM. Will bit increase BB for now in fear of infection.  MRudean Haskell MD [address] [place] [phone] 11:16 AM

## 2022-10-09 ENCOUNTER — Encounter (HOSPITAL_COMMUNITY): Payer: Self-pay | Admitting: Vascular Surgery

## 2022-10-09 DIAGNOSIS — N185 Chronic kidney disease, stage 5: Secondary | ICD-10-CM | POA: Diagnosis not present

## 2022-10-09 DIAGNOSIS — K920 Hematemesis: Secondary | ICD-10-CM | POA: Diagnosis not present

## 2022-10-09 DIAGNOSIS — I4729 Other ventricular tachycardia: Secondary | ICD-10-CM | POA: Diagnosis not present

## 2022-10-09 LAB — GLUCOSE, CAPILLARY
Glucose-Capillary: 101 mg/dL — ABNORMAL HIGH (ref 70–99)
Glucose-Capillary: 102 mg/dL — ABNORMAL HIGH (ref 70–99)
Glucose-Capillary: 177 mg/dL — ABNORMAL HIGH (ref 70–99)

## 2022-10-09 LAB — CBC WITH DIFFERENTIAL/PLATELET
Abs Immature Granulocytes: 0.04 10*3/uL (ref 0.00–0.07)
Basophils Absolute: 0 10*3/uL (ref 0.0–0.1)
Basophils Relative: 0 %
Eosinophils Absolute: 0.1 10*3/uL (ref 0.0–0.5)
Eosinophils Relative: 2 %
HCT: 20.5 % — ABNORMAL LOW (ref 36.0–46.0)
Hemoglobin: 6.4 g/dL — CL (ref 12.0–15.0)
Immature Granulocytes: 1 %
Lymphocytes Relative: 23 %
Lymphs Abs: 1.2 10*3/uL (ref 0.7–4.0)
MCH: 27.7 pg (ref 26.0–34.0)
MCHC: 31.2 g/dL (ref 30.0–36.0)
MCV: 88.7 fL (ref 80.0–100.0)
Monocytes Absolute: 0.2 10*3/uL (ref 0.1–1.0)
Monocytes Relative: 4 %
Neutro Abs: 3.9 10*3/uL (ref 1.7–7.7)
Neutrophils Relative %: 70 %
Platelets: 181 10*3/uL (ref 150–400)
RBC: 2.31 MIL/uL — ABNORMAL LOW (ref 3.87–5.11)
RDW: 15.9 % — ABNORMAL HIGH (ref 11.5–15.5)
WBC: 5.5 10*3/uL (ref 4.0–10.5)
nRBC: 0 % (ref 0.0–0.2)

## 2022-10-09 LAB — TRANSFUSION REACTION
DAT C3: NEGATIVE
Post RXN DAT IgG: NEGATIVE

## 2022-10-09 LAB — CULTURE, BLOOD (ROUTINE X 2)
Culture: NO GROWTH
Culture: NO GROWTH
Special Requests: ADEQUATE

## 2022-10-09 LAB — COMPREHENSIVE METABOLIC PANEL
ALT: 84 U/L — ABNORMAL HIGH (ref 0–44)
AST: 68 U/L — ABNORMAL HIGH (ref 15–41)
Albumin: 2.1 g/dL — ABNORMAL LOW (ref 3.5–5.0)
Alkaline Phosphatase: 234 U/L — ABNORMAL HIGH (ref 38–126)
Anion gap: 14 (ref 5–15)
BUN: 62 mg/dL — ABNORMAL HIGH (ref 6–20)
CO2: 23 mmol/L (ref 22–32)
Calcium: 7.7 mg/dL — ABNORMAL LOW (ref 8.9–10.3)
Chloride: 97 mmol/L — ABNORMAL LOW (ref 98–111)
Creatinine, Ser: 6.21 mg/dL — ABNORMAL HIGH (ref 0.44–1.00)
GFR, Estimated: 8 mL/min — ABNORMAL LOW (ref >60)
Glucose, Bld: 123 mg/dL — ABNORMAL HIGH (ref 70–99)
Potassium: 3.5 mmol/L (ref 3.5–5.1)
Sodium: 134 mmol/L — ABNORMAL LOW (ref 135–145)
Total Bilirubin: 0.8 mg/dL (ref 0.3–1.2)
Total Protein: 4.9 g/dL — ABNORMAL LOW (ref 6.5–8.1)

## 2022-10-09 LAB — PREPARE RBC (CROSSMATCH)

## 2022-10-09 LAB — BRAIN NATRIURETIC PEPTIDE: B Natriuretic Peptide: 1719.6 pg/mL — ABNORMAL HIGH (ref 0.0–100.0)

## 2022-10-09 LAB — MAGNESIUM: Magnesium: 1.8 mg/dL (ref 1.7–2.4)

## 2022-10-09 MED ORDER — DIPHENHYDRAMINE HCL 25 MG PO CAPS
25.0000 mg | ORAL_CAPSULE | Freq: Once | ORAL | Status: AC
  Administered 2022-10-09: 25 mg via ORAL
  Filled 2022-10-09: qty 1

## 2022-10-09 MED ORDER — DEXAMETHASONE 6 MG PO TABS
6.0000 mg | ORAL_TABLET | Freq: Once | ORAL | Status: AC
  Administered 2022-10-09: 6 mg via ORAL
  Filled 2022-10-09: qty 1

## 2022-10-09 MED ORDER — ENSURE ENLIVE PO LIQD: 237.0000 mL | Freq: Two times a day (BID) | ORAL | Status: DC

## 2022-10-09 MED ORDER — HEPARIN SODIUM (PORCINE) 1000 UNIT/ML IJ SOLN
INTRAMUSCULAR | Status: AC
  Administered 2022-10-09: 3200 [IU]
  Filled 2022-10-09: qty 4

## 2022-10-09 MED ORDER — RENA-VITE PO TABS
1.0000 | ORAL_TABLET | Freq: Every day | ORAL | Status: DC
  Administered 2022-10-09 – 2022-10-14 (×3): 1 via ORAL
  Filled 2022-10-09 (×4): qty 1

## 2022-10-09 MED ORDER — SODIUM CHLORIDE 0.9% IV SOLUTION: Freq: Once | INTRAVENOUS | Status: AC

## 2022-10-09 MED ORDER — ENSURE ENLIVE PO LIQD
237.0000 mL | Freq: Two times a day (BID) | ORAL | Status: DC
  Administered 2022-10-10 – 2022-10-15 (×5): 237 mL via ORAL

## 2022-10-09 MED ORDER — ACETAMINOPHEN 500 MG PO TABS
1000.0000 mg | ORAL_TABLET | Freq: Once | ORAL | Status: AC
  Administered 2022-10-09: 1000 mg via ORAL
  Filled 2022-10-09: qty 2

## 2022-10-09 NOTE — Progress Notes (Signed)
OT Cancellation Note  Patient Details Name: [name] MRN: [id number] DOB: [birth year]   Cancelled Treatment:    Reason Eval/Treat Not Completed: Patient at procedure or test/ unavailable (HD)  Elliot Cousin 10/09/2022, 3:11 PM

## 2022-10-09 NOTE — Progress Notes (Addendum)
Pt's referral for out-pt HD is pending with [facility]. Awaiting acceptance and schedule. Will assist as needed.   Melven Sartorius Renal Navigator [phone]  Addendum at 3:48 pm: Pt has been accepted at [facility] on MWF 10:45 chair time. Pt can start on Friday if stable for d/c by then and pt will need to arrive at 9:45 to complete paperwork prior to treatment. Met with pt at bedside while receiving HD. Discussed details and schedule letter provided. Pt agreeable to plan. Update provided to attending, nephrologist, RN, and RN CM.

## 2022-10-09 NOTE — Progress Notes (Signed)
[facility] for Infectious Disease  Date of Admission:  10/04/2022   Total days of inpatient antibiotics 5  Principal Problem:   Hematemesis Active Problems:   Pneumonia due to respiratory syncytial virus (RSV)   NSVT (nonsustained ventricular tachycardia) (HCC)   Chronic combined systolic and diastolic heart failure [facility])          Assessment: 52 year old female with diabetes with gastroparesis, CKD stage IV, hypertension admitted for RSV pneumonia, AKI on CKD.  ID engaged for persistent fevers. Noted to have productive cough.      #Fever likely 2/2 transfusion reaction #Anemia SP PRBC #AKI on CKD, started on HD on 3/4 - Followed by nephrology outpatient CKD 2/2 diabetic glomerulosclerosis and arterionephrosclerosis.  Recent left AVF placed.  Patient found to be uremic with nausea and shakiness, TDC placed on 3/4 patient started on HD -Pt presented with fever which subsided  fro about 24hrs. -She then fevered on 3/4 (100.8 at 1025) after starting PRBC at 1010 on 3/4. HD line placement(1458 on 3/4) -Right chest HD line without signs of skin soft tissue infection -Chest x-ray yesterday showed improved aeration -Stop vancomycin as  blood Cx from 3/4 NG x 48H. Fever curve trending down. I suspect fever 2/2 transfusion reaction in the setting of RSV PNA.    #DM  -A1c 7.5 on 3/2  ID will sign off.  Microbiology:   Antibiotics: Vancomycin 3/4- Doxy 3/1-3/3   Cultures: Blood 3/1 Ng 3/4 NG     3/1 RSV  PCR+  SUBJECTIVE: Resting in bed. No new complaints. Reports feeling fatigued.  Interval: Tmax 101.5, wbc 5.5  Review of Systems: Review of Systems  All other systems reviewed and are negative.    Scheduled Meds:  [START ON 10/11/2022] aspirin EC  81 mg Oral Daily   Chlorhexidine Gluconate Cloth  6 each Topical Q0600   darbepoetin (ARANESP) injection - DIALYSIS  100 mcg Subcutaneous Q Wed-1800   dexamethasone  6 mg Oral Once   feeding supplement  237 mL  Oral BID BM   furosemide  80 mg Intravenous BID   hydrALAZINE  75 mg Oral Q8H   insulin aspart  0-5 Units Subcutaneous QHS   insulin aspart  0-9 Units Subcutaneous TID WC   insulin glargine-yfgn  10 Units Subcutaneous QHS   isosorbide dinitrate  20 mg Oral TID   levalbuterol  0.63 mg Nebulization BID   metoprolol succinate  50 mg Oral Daily   multivitamin  1 tablet Oral QHS   pantoprazole (PROTONIX) IV  40 mg Intravenous Q12H   pravastatin  40 mg Oral q1800   scopolamine  1 patch Transdermal Q72H   tobramycin  1-2 drop Both Eyes See admin instructions   Continuous Infusions:  ferric gluconate (FERRLECIT) IVPB Stopped (10/07/22 2007)   PRN Meds:.acetaminophen, albuterol, hydrALAZINE, LORazepam, nitroGLYCERIN, oxyCODONE-acetaminophen, prochlorperazine Allergies  Allergen Reactions   Atorvastatin Other (See Comments)    Dizziness    Dulaglutide Nausea And Vomiting and Other (See Comments)    Caused pancreatitis    Metoclopramide Other (See Comments)    Possible movement disorder or tardive dyskinesia while on reglan   Other Itching    Peas and carrots   Sertraline Nausea And Vomiting   Zofran [Ondansetron] Nausea And Vomiting and Other (See Comments)    Causes nausea vomiting to worsen / doesn't really work for patient.   Amoxicillin Itching and Rash   Lantus [Insulin Glargine] Itching and Rash  Has tolerated insulin glargine many times with no reported issues   Miconazole Nitrate Nausea And Vomiting and Rash    OBJECTIVE: Vitals:   10/09/22 1030 10/09/22 1045 10/09/22 1130 10/09/22 1145  BP: 120/60 134/68 (!) 141/68 (!) 146/75  Pulse: 72 71  70  Resp: (!) 30 (!) '26 19 18  '$ Temp: 98 F (36.7 C) 98.2 F (36.8 C)  98.1 F (36.7 C)  TempSrc: Oral Oral  Oral  SpO2:    98%  Weight:      Height:       Body mass index is 24.28 kg/m.  Physical Exam Constitutional:      Appearance: Normal appearance.  HENT:     Head: Normocephalic and atraumatic.     Right Ear:  Tympanic membrane normal.     Left Ear: Tympanic membrane normal.     Nose: Nose normal.     Mouth/Throat:     Mouth: Mucous membranes are moist.  Eyes:     Extraocular Movements: Extraocular movements intact.     Conjunctiva/sclera: Conjunctivae normal.     Pupils: Pupils are equal, round, and reactive to light.  Cardiovascular:     Rate and Rhythm: Normal rate and regular rhythm.     Heart sounds: No murmur heard.    No friction rub. No gallop.  Pulmonary:     Effort: Pulmonary effort is normal.     Breath sounds: Normal breath sounds.  Abdominal:     General: Abdomen is flat.     Palpations: Abdomen is soft.  Musculoskeletal:        General: Normal range of motion.  Skin:    General: Skin is warm and dry.  Neurological:     General: No focal deficit present.     Mental Status: She is alert and oriented to person, place, and time.  Psychiatric:        Mood and Affect: Mood normal.       Lab Results Lab Results  Component Value Date   WBC 5.5 10/09/2022   HGB 6.4 (LL) 10/09/2022   HCT 20.5 (L) 10/09/2022   MCV 88.7 10/09/2022   PLT 181 10/09/2022    Lab Results  Component Value Date   CREATININE 6.21 (H) 10/09/2022   BUN 62 (H) 10/09/2022   NA 134 (L) 10/09/2022   K 3.5 10/09/2022   CL 97 (L) 10/09/2022   CO2 23 10/09/2022    Lab Results  Component Value Date   ALT 84 (H) 10/09/2022   AST 68 (H) 10/09/2022   ALKPHOS 234 (H) 10/09/2022   BILITOT 0.8 10/09/2022        Laurice Record, [place] [facility] 10/09/2022, 2:04 PM

## 2022-10-09 NOTE — Progress Notes (Signed)
Physical Therapy Treatment Patient Details Name: [name] MRN: [id number] DOB: 12-19-1970 Today's Date: 10/09/2022   History of Present Illness Patient is a 52 y/o female who presents on 3/1 with abdominal pain, N/V, SOB and possible ICD shocks. Found to have RSV PNA, AKI on CKD with likely initiation of dialysis, chronic gastroparesis and thrombosed LUE AVF. PMH includes HTN, DM2, gastroparesis, CKD, CHF, sickle cell trait, depression, neuropathy.    PT Comments    Pt limited during today's session by lethargy. Pt agreeable to session but only tolerating sitting EOB and 1 stand before requesting to return to supine. Pt reports feeling fatigued and weaker, receiving blood earlier this morning but not fully completing per RN. Acute PT will continue to follow up with pt as appropriate to progress mobility, discharge plan remains appropriate at this time as pt requiring minA for all mobility but anticipate progression once pt feeling better.     Recommendations for follow up therapy are one component of a multi-disciplinary discharge planning process, led by the attending physician.  Recommendations may be updated based on patient status, additional functional criteria and insurance authorization.  Follow Up Recommendations  Home health PT     Assistance Recommended at Discharge Intermittent Supervision/Assistance  Patient can return home with the following A little help with walking and/or transfers;A little help with bathing/dressing/bathroom;Help with stairs or ramp for entrance;Assist for transportation;Assistance with cooking/housework   Equipment Recommendations  Other (comment) (TBA pending progress)    Recommendations for Other Services       Precautions / Restrictions Precautions Precautions: Fall Restrictions Weight Bearing Restrictions: No     Mobility  Bed Mobility Overal bed mobility: Needs Assistance Bed Mobility: Supine to Sit, Sit to Supine     Supine to  sit: Min assist, HOB elevated Sit to supine: Min assist, HOB elevated   General bed mobility comments: use of bed rails, assist for trunk support for supine>sit, assist for BLE management for return to supine    Transfers Overall transfer level: Needs assistance Equipment used: 1 person hand held assist Transfers: Sit to/from Stand Sit to Stand: Min assist           General transfer comment: minA to stand for power up and steady    Ambulation/Gait               General Gait Details: declined   Marine scientist Rankin (Stroke Patients Only)       Balance Overall balance assessment: Needs assistance Sitting-balance support: Bilateral upper extremity supported, Feet supported Sitting balance-Leahy Scale: Good Sitting balance - Comments: stable sitting balance   Standing balance support: Single extremity supported Standing balance-Leahy Scale: Poor Standing balance comment: external support for standing                            Cognition Arousal/Alertness: Lethargic Behavior During Therapy: Flat affect Overall Cognitive Status: Within Functional Limits for tasks assessed                                 General Comments: pt lethargic but responding to all questions and following commands, reports feeling fatigued        Exercises      General Comments General comments (skin integrity, edema, etc.): VSS on 2L O2 [place]  Pertinent Vitals/Pain Pain Assessment Pain Assessment: Faces Faces Pain Scale: Hurts a little bit Pain Location: generalized Pain Descriptors / Indicators: Discomfort Pain Intervention(s): Monitored during session, Limited activity within patient's tolerance    Home Living                          Prior Function            PT Goals (current goals can now be found in the care plan section) Acute Rehab PT Goals Patient Stated Goal: to feel better PT  Goal Formulation: With patient Time For Goal Achievement: 10/21/22 Potential to Achieve Goals: Fair Progress towards PT goals: Progressing toward goals    Frequency    Min 3X/week      PT Plan Current plan remains appropriate    Co-evaluation              AM-PAC PT "6 Clicks" Mobility   Outcome Measure  Help needed turning from your back to your side while in a flat bed without using bedrails?: A Little Help needed moving from lying on your back to sitting on the side of a flat bed without using bedrails?: A Little Help needed moving to and from a bed to a chair (including a wheelchair)?: A Little Help needed standing up from a chair using your arms (e.g., wheelchair or bedside chair)?: A Little Help needed to walk in hospital room?: A Lot Help needed climbing 3-5 steps with a railing? : Total 6 Click Score: 15    End of Session   Activity Tolerance: Patient limited by lethargy Patient left: in bed;with call bell/phone within reach Nurse Communication: Mobility status PT Visit Diagnosis: Pain;Muscle weakness (generalized) (M62.81);Unsteadiness on feet (R26.81);Difficulty in walking, not elsewhere classified (R26.2) Pain - Right/Left: Right Pain - part of body: Arm     Time: [id number] PT Time Calculation (min) (ACUTE ONLY): 13 min  Charges:  $Therapeutic Activity: 8-22 mins                     [name], PT DPT Acute Rehabilitation Services Office [phone]    [name] 10/09/2022, 3:39 PM

## 2022-10-09 NOTE — Plan of Care (Signed)
Nutrition Education Note  RD consulted for Renal Education. Provided "Nutrition for Dialysis" and "Food Label and Chronic Kidney Disease" handouts to patient. Reviewed food groups and recommendations for intake on dialysis. Explained why diet restrictions are needed and provided lists of foods to limit/avoid that are high potassium, sodium, and phosphorus. Provided recommendations on safer alternatives of these foods. Discussed how to read a food label. Strongly encouraged compliance of this diet.   Discussed importance of protein intake at each meal and snack. Provided examples of how to maximize protein intake throughout the day. Discussed need for fluid restriction with dialysis, importance of minimizing weight gain between HD treatments, and renal-friendly beverage options.  Encouraged pt to discuss specific diet questions/concerns with RD at HD outpatient facility. Teach back method used.  Expect fair to good compliance.  Body mass index is 24.28 kg/m. Pt meets criteria for normal weight based on current BMI.  Current diet order is soft, per review of chart pt ate 100% of breakfast this morning. Labs and medications reviewed. RD will continue to follow pt during admission.  Loanne Drilling, MS, RD, LDN, CNSC Pager number available on Amion

## 2022-10-09 NOTE — Progress Notes (Signed)
Initial Nutrition Assessment  DOCUMENTATION CODES:   Not applicable  INTERVENTION:  Encouraged intake of small, frequent meals in setting of gastroparesis with nausea and emesis.  RD ordered snacks po TID between meals. RD has ordered snacks in [employer] per pt preference (vanilla pudding, chicken salad with low sodium saltines, vanilla pudding and chicken salad with low sodium saltines). Recommend adjusting insulin regimen for 6 small, frequent meals as medically appropriate in setting of type 1 diabetes and gastroparesis.  Provide Ensure Enlive po BID, each supplement provides 350 kcal and 20 grams of protein. Pt prefers vanilla or strawberry.  Added reported allergies of peas and carrots to chart and [employer].  Provide Rena-vite po QHS.  Provided education regarding new ESRD and HD per consult (note to follow).   NUTRITION DIAGNOSIS:   Inadequate oral intake related to nausea, vomiting as evidenced by per patient/family report.  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  3/4: image guided tunneled hemodialysis catheter placement by IR  Pt underwent first HD on 3/4 and plan is for HD again today per Nephrology note.  Met with pt at  bedside. She reports that first HD session went well but overall she feels tired. She reports she has had a decreased appetite and intake for the past 3 days in the setting of nausea and vomiting. She also endorses some abdominal pain. She reports typically eating small, frequent meals (~ 6 meals daily) in setting of her gastroparesis. She reports typically eating eggs, fish, chicken, variety of vegetables, smoothies, and Ensure (vanilla and strawberry). She reports peas and carrots as a food allergy (itching with intake) so she avoids these foods. She denies any difficulty with chewing and swallowing. Pt requested snacks be ordered between meals so she can eat small, frequent meals in setting of gastroparesis. Also ordered Ensure to help meet calorie/protein needs. Recommend adjusting insulin regimen as appropriate.    Pt denies unplanned weight loss. She reports her UBW is 175 lbs. Most recent wt is 81.2 kg (179.01 lbs) so may be falsely elevated from fluid. Will continue to monitor trends.  UOP: 700 mL UOP  I/O: -2064.5 mL since admission  Medications reviewed and include: Lasix 80 mg BID IV, Novolog 0-9 units TID, Novolog 0-5 units QHS, Semglee 10 units QHS, pantoprazole, scopolamine patch, ferric gluconate 250 mg every Monday/Wednesday/Friday  Labs reviewed: CBG 101-174, Sodium 134, Chloride 97, BUN 62, Creatinine 6.21. Potassium WNL. Phosphorus 5.9 on 3/4. HgbA1c 7.5 H 10/05/22  Discussed with RN.  NUTRITION - FOCUSED PHYSICAL EXAM:  Flowsheet Row Most Recent Value  Orbital Region No depletion  Upper Arm Region No depletion  Thoracic and Lumbar Region No depletion  Buccal Region No depletion  Temple Region No depletion  Clavicle Bone Region No depletion  Clavicle and Acromion Bone Region  Mild depletion  Scapular Bone Region No depletion  Dorsal Hand Moderate depletion  Patellar Region Mild depletion  Anterior Thigh Region Mild depletion  Posterior Calf Region Moderate depletion   Edema (RD Assessment) Mild  Hair Reviewed  [pt notes alopecia]  Eyes Reviewed  Mouth Reviewed  Skin Reviewed  Nails Reviewed       Diet Order:   Diet Order             DIET SOFT Fluid consistency: Thin  Diet effective now                  EDUCATION NEEDS:   Education needs have been addressed  Skin:  Skin Assessment: Reviewed RN Assessment (closed incision left arm)  Last BM:  10/08/22 - large type 6  Height:   Ht Readings from Last 1 Encounters:  10/04/22 6' (1.829 m)   Weight:   Wt Readings from Last 1 Encounters:  10/07/22 81.2 kg   BMI:  Body mass index is 24.28 kg/m.  Estimated Nutritional Needs:   Kcal:  2000-2200  Protein:  100-110 grams  Fluid:  UOP + 1 L  Marieke Lubke Magda Paganini, MS, RD, LDN, CNSC Pager number available on Amion

## 2022-10-09 NOTE — Progress Notes (Addendum)
Rounding Note    Patient Name: [name] Date of Encounter: 10/09/2022  [place] Cardiologist: Werner Lean, MD   Subjective   Patient appears in no acute distress. Blood noted around right wrist IV site where she is receiving transfusion. Nursing staff had already been notified and were addressing. Denies chest pain, palpitations, shortness of breath. Continues to have chills/myalgias with fatigue.   Inpatient Medications    Scheduled Meds:  sodium chloride   Intravenous Once   [START ON 10/11/2022] aspirin EC  81 mg Oral Daily   Chlorhexidine Gluconate Cloth  6 each Topical Q0600   darbepoetin (ARANESP) injection - DIALYSIS  100 mcg Subcutaneous Q Wed-1800   furosemide  80 mg Intravenous BID   hydrALAZINE  75 mg Oral Q8H   insulin aspart  0-5 Units Subcutaneous QHS   insulin aspart  0-9 Units Subcutaneous TID WC   insulin glargine-yfgn  10 Units Subcutaneous QHS   isosorbide dinitrate  20 mg Oral TID   levalbuterol  0.63 mg Nebulization BID   metoprolol succinate  50 mg Oral Daily   pantoprazole (PROTONIX) IV  40 mg Intravenous Q12H   pravastatin  40 mg Oral q1800   scopolamine  1 patch Transdermal Q72H   sodium bicarbonate  1,300 mg Oral BID   tobramycin  1-2 drop Both Eyes See admin instructions   Continuous Infusions:  ferric gluconate (FERRLECIT) IVPB Stopped (10/07/22 2007)   PRN Meds: acetaminophen, albuterol, hydrALAZINE, LORazepam, nitroGLYCERIN, oxyCODONE-acetaminophen, prochlorperazine   Vital Signs    Vitals:   10/09/22 0623 10/09/22 0740 10/09/22 1000 10/09/22 1030  BP: (!) 142/64 (!) 148/62 (!) 142/64 120/60  Pulse:  69  72  Resp:  (!) 25 (!) 33 (!) 30  Temp:  97.9 F (36.6 C)  98 F (36.7 C)  TempSrc:  Oral  Oral  SpO2:  100%    Weight:      Height:        Intake/Output Summary (Last 24 hours) at 10/09/2022 1110 Last data filed at 10/08/2022 1200 Gross per 24 hour  Intake --  Output 700 ml  Net -700 ml       10/07/2022    6:45 PM 10/07/2022    4:15 PM 10/04/2022    5:36 PM  Last 3 Weights  Weight (lbs) 179 lb 0.2 oz 180 lb 8.9 oz 179 lb 7.3 oz  Weight (kg) 81.2 kg 81.9 kg 81.4 kg      Telemetry    Sinus rhythm - Personally Reviewed  ECG    No new tracing - Personally Reviewed  Physical Exam   GEN: No acute distress.   Neck: No JVD Cardiac: RRR, no murmurs, rubs, or gallops.  Respiratory: Upper lobe crackles/wheezing L>R. No lower lobe rales noted GI: Soft, nontender, non-distended  MS: No edema; No deformity. Right wrist IV site noted to be oozing. Nursing staff aware. Neuro:  Nonfocal  Psych: Normal affect   Labs    High Sensitivity Troponin:   Recent Labs  Lab 10/04/22 1000 10/04/22 1126  TROPONINIHS 24* 26*     Chemistry Recent Labs  Lab 10/07/22 0256 10/08/22 0606 10/09/22 0602  NA 133* 134* 134*  K 3.8 3.7 3.5  CL 104 100 97*  CO2 21* 19* 23  GLUCOSE 133* 147* 123*  BUN 57* 46* 62*  CREATININE 5.31* 4.62* 6.21*  CALCIUM 7.8* 7.5* 7.7*  MG 1.8 1.6* 1.8  PROT 5.3* 5.2* 4.9*  ALBUMIN 2.3* 2.3* 2.1*  AST  125* 59* 68*  ALT 96* 84* 84*  ALKPHOS 190* 149* 234*  BILITOT 0.6 0.8 0.8  GFRNONAA 9* 11* 8*  ANIONGAP '8 15 14    '$ Lipids No results for input(s): "CHOL", "TRIG", "HDL", "LABVLDL", "LDLCALC", "CHOLHDL" in the last 168 hours.  Hematology Recent Labs  Lab 10/08/22 0606 10/08/22 1627 10/09/22 0602  WBC 6.7 6.1 5.5  RBC 2.44* 2.38* 2.31*  HGB 7.1* 7.0* 6.4*  HCT 21.3* 20.7* 20.5*  MCV 87.3 87.0 88.7  MCH 29.1 29.4 27.7  MCHC 33.3 33.8 31.2  RDW 15.7* 16.0* 15.9*  PLT 202 195 181   Thyroid No results for input(s): "TSH", "FREET4" in the last 168 hours.  BNP Recent Labs  Lab 10/04/22 1000 10/08/22 0606 10/09/22 0602  BNP 1,496.7* 2,182.7* 1,719.6*    DDimer  Recent Labs  Lab 10/04/22 1000  DDIMER 6.70*     Radiology    DG Chest Port 1 View  Result Date: 10/08/2022 CLINICAL DATA:  Shortness of breath and cough. Status post dialysis  catheter placement. EXAM: PORTABLE CHEST 1 VIEW COMPARISON:  Single-view of the chest 10/07/2022. FINDINGS: New right IJ approach dialysis catheter is in place. Its tip projects at the superior cavoatrial junction. No pneumothorax. There is cardiomegaly and vascular congestion. Edema seen on the prior examination has resolved. No pleural effusion. IMPRESSION: Dialysis catheter tip projects at the superior cavoatrial junction. Negative for pneumothorax. Cardiomegaly and mild vascular congestion. Aeration is improved compared to yesterday's exam. Electronically Signed   By: Inge Rise M.D.   On: 10/08/2022 08:22   IR Fluoro Guide CV Line Right  Result Date: 10/07/2022 INDICATION: 52 year old female with history of acute on chronic kidney disease requiring initiation of hemodialysis. EXAM: TUNNELED CENTRAL VENOUS HEMODIALYSIS CATHETER PLACEMENT WITH ULTRASOUND AND FLUOROSCOPIC GUIDANCE MEDICATIONS: Vancomycin 1 gm IV . The antibiotic was given in an appropriate time interval prior to skin puncture. ANESTHESIA/SEDATION: Moderate (conscious) sedation was employed during this procedure. A total of Versed 1 mg and Fentanyl 50 mcg was administered intravenously. Moderate Sedation Time: 10 minutes. The patient's level of consciousness and vital signs were monitored continuously by radiology nursing throughout the procedure under my direct supervision. FLUOROSCOPY TIME:  One mGy COMPLICATIONS: None immediate. PROCEDURE: Informed written consent was obtained from the patient after a discussion of the risks, benefits, and alternatives to treatment. Questions regarding the procedure were encouraged and answered. The right neck and chest were prepped with chlorhexidine in a sterile fashion, and a sterile drape was applied covering the operative field. Maximum barrier sterile technique with sterile gowns and gloves were used for the procedure. A timeout was performed prior to the initiation of the procedure. After  creating a small venotomy incision, a 21 gauge micropuncture kit was utilized to access the internal jugular vein. Real-time ultrasound guidance was utilized for vascular access including the acquisition of a permanent ultrasound image documenting patency of the accessed vessel. A Rosen wire was advanced to the level of the IVC and the micropuncture sheath was exchanged for an 8 Fr dilator. A 14.5 French tunneled hemodialysis catheter measuring 19 cm from tip to cuff was tunneled in a retrograde fashion from the anterior chest wall to the venotomy incision. Serial dilation was then performed an a peel-away sheath was placed. The catheter was then placed through the peel-away sheath with the catheter tip ultimately positioned within the right atrium. Final catheter positioning was confirmed and documented with a spot radiographic image. The catheter aspirates and flushes normally. The catheter  was flushed with appropriate volume heparin dwells. The catheter exit site was secured with a 0-Silk retention suture. The venotomy incision was closed with Dermabond. Sterile dressings were applied. The patient tolerated the procedure well without immediate post procedural complication. IMPRESSION: Successful placement of 19 cm tip to cuff tunneled hemodialysis catheter via the right internal jugular vein with catheter tip terminating within the right atrium. The catheter is ready for immediate use. Ruthann Cancer, MD [facility] Radiology Electronically Signed   By: Ruthann Cancer M.D.   On: 10/07/2022 16:05   IR US Guide Vasc Access Right  RESULT     INDICATION: 52 year old female with history of acute on chronic kidney disease requiring initiation of hemodialysis. EXAM: TUNNELED CENTRAL VENOUS HEMODIALYSIS CATHETER PLACEMENT WITH ULTRASOUND AND FLUOROSCOPIC GUIDANCE MEDICATIONS: Vancomycin 1 gm IV . The antibiotic was given in an appropriate time interval prior to skin  puncture. ANESTHESIA/SEDATION: Moderate (conscious) sedation was employed during this procedure. A total of Versed 1 mg and Fentanyl 50 mcg was administered intravenously. Moderate Sedation Time: 10 minutes. The patient's level of consciousness and vital signs were monitored continuously by radiology nursing throughout the procedure under my direct supervision. FLUOROSCOPY TIME:  One mGy COMPLICATIONS: None immediate. PROCEDURE: Informed written consent was obtained from the patient after a discussion of the risks, benefits, and alternatives to treatment. Questions regarding the procedure were encouraged and answered. The right neck and chest were prepped with chlorhexidine in a sterile fashion, and a sterile drape was applied covering the operative field. Maximum barrier sterile technique with sterile gowns and gloves were used for the procedure. A timeout was performed prior to the initiation of the procedure. After creating a small venotomy incision, a 21 gauge micropuncture kit was utilized to access the internal jugular vein. Real-time ultrasound guidance was utilized for vascular access including the acquisition of a permanent ultrasound image documenting patency of the accessed vessel. A Rosen wire was advanced to the level of the IVC and the micropuncture sheath was exchanged for an 8 Fr dilator. A 14.5 French tunneled hemodialysis catheter measuring 19 cm from tip to cuff was tunneled in a retrograde fashion from the anterior chest wall to the venotomy incision. Serial dilation was then performed an a peel-away sheath was placed. The catheter was then placed through the peel-away sheath with the catheter tip ultimately positioned within the right atrium. Final catheter positioning was confirmed and documented with a spot radiographic image. The catheter aspirates and flushes normally. The catheter was flushed with appropriate volume heparin dwells. The catheter exit site was secured with a 0-Silk retention  suture. The venotomy incision was closed with Dermabond. Sterile dressings were applied. The patient tolerated the procedure well without immediate post procedural complication. IMPRESSION: Successful placement of 19 cm tip to cuff tunneled hemodialysis catheter via the right internal jugular vein with catheter tip terminating within the right atrium. The catheter is ready for immediate use. Ruthann Cancer, MD [facility] Radiology    Cardiac Studies   Coronary CTA 01/19/2020: Impressions: 1. Coronary calcium score of 0. 2. Normal coronary origin with right dominance. 3. Normal coronary arteries. 4. Severe biatrial enlargement. _______________   Echocardiogram 10/05/2022: Impressions:  1. Left ventricular ejection fraction, by estimation, is 40 to 45%. The  left ventricle has mildly decreased function. The left ventricle  demonstrates global hypokinesis. There is mild concentric left ventricular  hypertrophy. Left ventricular diastolic  parameters are consistent with Grade III diastolic dysfunction  (  restrictive). Elevated left atrial pressure.   2. Right ventricular systolic function is mildly reduced. The right  ventricular size is moderately enlarged. There is moderately elevated  pulmonary artery systolic pressure.   3. Left atrial size was moderately dilated.   4. Right atrial size was moderately dilated.   5. The mitral valve is normal in structure. No evidence of mitral valve  regurgitation. No evidence of mitral stenosis.   6. Tricuspid valve regurgitation is moderate to severe.   7. The aortic valve is tricuspid. Aortic valve regurgitation is not  visualized. No aortic stenosis is present.   8. The inferior vena cava is dilated in size with <50% respiratory  variability, suggesting right atrial pressure of 15 mmHg.   Comparison(s): Prior images reviewed side by side. The left ventricular  function is significantly worse. The right  ventricular systolic function  is worse. TR severity and PA hypertension are also worse, with evidence of  hypervolemia.   Patient Profile     [name] is a 52 y.o. 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 Feliberto Gottron.     Assessment & Plan    Chest pain Coronary microvascular disease   Patient recently reported chest pain in clinic appointment with Dr. Gasper Sells, started on Imdur. Admitted on 3/1 with abdominal pain and hematemesis. Noted to have elevated troponin 24, 26. ECG with stable T wave inversion in V4-V6. Echo with no WMA.    No evidence of ACS at this time. Patient now on Isordil '20mg'$  with good response, no recurrent chest pain (has Imdur listed as PTA med but was not taking).  Will continue chest pain workup in outpatient setting Continue pravastatin '40mg'$ .   Chronic HFmrEF   Patient with EF previously down to 40-45% had EF recovery to 50-55%. Repeat TTE this admission now shows LVEF back in the range of 40-45%. BNP elevated to 1496 and chest x-ray with bilateral perihilar and bibasilar opacities.    On physical exam, patient appears to be generally euvolemic though repeat BNP increased from 1496.7->2182.7. Suspect pneumonia to be primary factor behind dyspnea at this time. Continue GDMT of Hydralazine (increased to '75mg'$  TID), Isordil '20mg'$  TID, Metoprolol Succinate. Will hold off on increasing metoprolol due to concerns of worsening infection/possible bacteremia.  Continuing IV diuresis today though patient with decreased urine output now in ESRD. Now on dialysis which will assist in volume management.    Dyspnea RSV  pneumonia   Patient with fever, chills, myalgias x3 days now. Initially attributed to transfusion. Concern for progressing infection. In the setting of new HD, persistent fever/chills/myalgias concerning. Blood cultures so far are negative. Per ID, patient should remain on vanc until blood cultures negative x48 hours. Suspect multi-factorial etiology of dyspnea with infection and chronic heart failure.    Hypertension   Hypertension slightly worse today following initial improving on admission. As above, Hydralazine increased.   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 Dialysis initiated 3/4 with 855m removed. Second session planned for today.    VF arrest   Patient with ICD following VF arrest in  September of 2023. Interrogation this admission shows appropriate function.   Per primary team:   RSV pneumonia Transfusion reaction Gastroparesis DM type I     For questions or updates, please contact [place] Please consult [email or web address] for contact info under     Signed, Lily Kocher, PA-C  10/09/2022, 11:10 AM    Personally seen and examined. Agree with APP above with the following comments: Pending dialysis.Still feels poorly.  Now Seeing ID. No further ectopy.  Continue further AV nodal therapy.  Will sign off on current cardiac regimen Working on earlier follow up  Rudean Haskell, MD [place]  [address], [place] [address] [phone]  12:54 PM

## 2022-10-09 NOTE — Progress Notes (Signed)
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 Recent Left AVF placed by Dr. Donnetta Hutching.  Recent progressive CKD in the setting of multiple hospitalization, ongoing gastroparesis flare etc.  She is on antibiotics for pneumonia.   - having some uremic symptoms- nausea and shakiness - started dialysis 10/07/22 - TDC placed by IR, appreciate assistance - HD #1 3/4 after [facility] and also Hep B - appreciate VVS eval of thrombosed AVF - HD #2 10/09/22 - CLIP in process   # Possible pneumonia/acute hypoxic respiratory failure/RSV+: s/p ceftriaxone and doxy. currently on oxygen.    # Acute on chronic diastolic CHF with fluid overload/pleural effusion: IV diuretics.  Echo with EF of 40 to 45%, mild concentric LVH.  Noted cardiology is consulted.   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  Corrected calcium and phosphorus level acceptable for CKD.   # Anemia of CKD: - started iron load 3/4 - reorder ESA - since IV looks like it's not working- we can give blood in HD   # Hypertension/volume: Currently on cardiac medication including metoprolol, Imdur, hydralazine.  Diuretics as above.  Blood pressure acceptable.  #fevers: - blood cultures 3/5 - timing appears c/w febrile transfusion reaction- but she has a new TDC too - got one dose of vanc - PCT up to 8.13--> but can be elevated after febrile reactions - closely monitor on dialysis and the new catheter    Subjective:    Seen in room.  Hgb down to 6.4.  Getting blood transfusion- looks like IV is not infusing properly, charge RN informed.  For HD today.  Tmax 102.8 3/5 at 12 pm.  Got one dose of vanc yesterday.   Objective:   BP (!) 146/75 (BP Location: Left Leg)   Pulse 70   Temp 98.1 F (36.7 C) (Oral)   Resp 18   Ht 6' (1.829 m)   Wt 81.2 kg   LMP 11/17/2021  (Approximate)   SpO2 98%   BMI 24.28 kg/m   Physical Exam: [id number] pale and  CVS: RRR Resp: bilateral wheezing Abd: soft Ext: 1+ Le edema ACCESS: L AVF thrombosed, R IJ TDC   Labs: BMET Recent Labs  Lab 10/04/22 1000 10/05/22 0242 10/06/22 0730 10/07/22 0256 10/08/22 0606 10/09/22 0602  NA 140 140 137 133* 134* 134*  K 4.1 3.8 4.3 3.8 3.7 3.5  CL 107 109 106 104 100 97*  CO2 23 22 16* 21* 19* 23  GLUCOSE 288* 184* 227* 133* 147* 123*  BUN 37* 39* 48* 57* 46* 62*  CREATININE 3.61* 4.12* 4.73* 5.31* 4.62* 6.21*  CALCIUM 8.9 8.0* 8.0* 7.8* 7.5* 7.7*  PHOS  --  3.8 5.4* 5.9*  --   --    CBC Recent Labs  Lab 10/04/22 1000 10/05/22 0242 10/07/22 0754 10/08/22 0606 10/08/22 1627 10/09/22 0602  WBC 7.2   < > 5.1 6.7 6.1 5.5  NEUTROABS 6.5  --   --  5.7 4.8 3.9  HGB 9.4*   < > 7.1* 7.1* 7.0* 6.4*  HCT 28.6*   < > 21.2* 21.3* 20.7* 20.5*  MCV 86.9   < > 87.6 87.3 87.0 88.7  PLT 288   < > 193 202 195 181   < > = values in this interval not displayed.      Medications:     [  START ON 10/11/2022] aspirin EC  81 mg Oral Daily   Chlorhexidine Gluconate Cloth  6 each Topical Q0600   darbepoetin (ARANESP) injection - DIALYSIS  100 mcg Subcutaneous Q Wed-1800   feeding supplement  237 mL Oral BID BM   furosemide  80 mg Intravenous BID   hydrALAZINE  75 mg Oral Q8H   insulin aspart  0-5 Units Subcutaneous QHS   insulin aspart  0-9 Units Subcutaneous TID WC   insulin glargine-yfgn  10 Units Subcutaneous QHS   isosorbide dinitrate  20 mg Oral TID   levalbuterol  0.63 mg Nebulization BID   metoprolol succinate  50 mg Oral Daily   multivitamin  1 tablet Oral QHS   pantoprazole (PROTONIX) IV  40 mg Intravenous Q12H   pravastatin  40 mg Oral q1800   scopolamine  1 patch Transdermal Q72H   sodium bicarbonate  1,300 mg Oral BID   tobramycin  1-2 drop Both Eyes See admin instructions     Madelon Lips MD 10/09/2022, 11:59 AM

## 2022-10-09 NOTE — Plan of Care (Signed)
  Problem: Education: Goal: Knowledge of General Education information will improve Description: Including pain rating scale, medication(s)/side effects and non-pharmacologic comfort measures Outcome: Progressing   Problem: Health Behavior/Discharge Planning: Goal: Ability to manage health-related needs will improve Outcome: Progressing   Problem: Clinical Measurements: Goal: Ability to maintain clinical measurements within normal limits will improve Outcome: Progressing Goal: Will remain free from infection Outcome: Progressing Goal: Diagnostic test results will improve Outcome: Progressing Goal: Respiratory complications will improve Outcome: Progressing Goal: Cardiovascular complication will be avoided Outcome: Progressing   Problem: Activity: Goal: Risk for activity intolerance will decrease Outcome: Progressing   Problem: Nutrition: Goal: Adequate nutrition will be maintained Outcome: Progressing   Problem: Coping: Goal: Level of anxiety will decrease Outcome: Progressing   Problem: Elimination: Goal: Will not experience complications related to bowel motility Outcome: Progressing Goal: Will not experience complications related to urinary retention Outcome: Progressing   Problem: Pain Managment: Goal: General experience of comfort will improve Outcome: Progressing   Problem: Safety: Goal: Ability to remain free from injury will improve Outcome: Progressing   Problem: Skin Integrity: Goal: Risk for impaired skin integrity will decrease Outcome: Progressing   Problem: Education: Goal: Ability to describe self-care measures that may prevent or decrease complications (Diabetes Survival Skills Education) will improve Outcome: Progressing Goal: Individualized Educational Video(s) Outcome: Progressing   Problem: Coping: Goal: Ability to adjust to condition or change in health will improve Outcome: Progressing   Problem: Fluid Volume: Goal: Ability to  maintain a balanced intake and output will improve Outcome: Progressing   Problem: Health Behavior/Discharge Planning: Goal: Ability to identify and utilize available resources and services will improve Outcome: Progressing Goal: Ability to manage health-related needs will improve Outcome: Progressing   Problem: Metabolic: Goal: Ability to maintain appropriate glucose levels will improve Outcome: Progressing   Problem: Nutritional: Goal: Maintenance of adequate nutrition will improve Outcome: Progressing Goal: Progress toward achieving an optimal weight will improve Outcome: Progressing   Problem: Skin Integrity: Goal: Risk for impaired skin integrity will decrease Outcome: Progressing   Problem: Tissue Perfusion: Goal: Adequacy of tissue perfusion will improve Outcome: Progressing   Problem: Education: Goal: Knowledge of disease and its progression will improve Outcome: Progressing Goal: Individualized Educational Video(s) Outcome: Progressing   Problem: Fluid Volume: Goal: Compliance with measures to maintain balanced fluid volume will improve Outcome: Progressing   Problem: Health Behavior/Discharge Planning: Goal: Ability to manage health-related needs will improve Outcome: Progressing   Problem: Nutritional: Goal: Ability to make healthy dietary choices will improve Outcome: Progressing   Problem: Clinical Measurements: Goal: Complications related to the disease process, condition or treatment will be avoided or minimized Outcome: Progressing   

## 2022-10-09 NOTE — Progress Notes (Signed)
Received patient in bed to unit.  Alert and oriented.  Informed consent signed and in chart.   [place] duration:3h  Patient tolerated well. Pt received 1 unit prbc's Transported back to the room  Alert, without acute distress.  Hand-off given to patient's nurse.   Access used: Catheter Access issues: none  Total UF removed: .5L Medication(s) given: Decadron,Ferrlecit Post HD VS: 199/87,100%,29,98.8,29 Post HD weight: 81.7kg   [facility]

## 2022-10-09 NOTE — Progress Notes (Signed)
PROGRESS NOTE                                                                                                                                                                                                             Patient Demographics:    [name], is a 52 y.o. 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   Subjective:   Patient with Tmax 102.8 over last 24 hours, but no fever overnight, most recent 100.3 at 20 2:29 PM, patient reports fatigue, tired, denies chest pain or dyspnea.  She still reports poor appetite.    Assessment  & Plan :    Diabetic gastroparesis acute on chronic exacerbation due to RSV viral infection causing recurrent and persistent nausea vomiting.   Continue supportive care, abdominal exam is benign, although she is on Reglan 3 times a day continues to have nausea vomiting, continue supportive care and consult GI, abdominal exam KUB stable.  Fevers starting 10/07/2022.   -Follow-up  blood cultures, so far no growth to date. -Felt to be secondary to transfusion reaction, discussed with Dr. Alvy Bimler , she recommends Benadryl and will before transfusion, as well can add steroids if needed.  Chest x-ray and recent UA unremarkable.  RSV viral pneumonitis.  Currently not hypoxic.  No acute issues, this has clinically resolved.  Acute on chronic anemia.  Does have underlying anemia of chronic disease with acute drop due  to multiple blood draws and acute illness, also could have some mild upper GI bleed due to persistent nausea vomiting causing Mallory-Weiss tear, IV PPI, hold heparin and aspirin for few days, she was ordered 1 unit of packed RBC transfusion on 10/07/2022 but had a mild febrile transfusion reaction, transfusion labs checked which are stable, case discussed with pathologist on call on 10/08/2022.  All changes consistent with mild febrile transfusion reaction. -Globin 6.4 today, her IV Leight unit transfusion today, she was premedicated with Benadryl and Tylenol, she already received these meds earlier today, supposed to get another unit during HD today, so we will hold on repeating dose and will give 6 mg of Decadron.  Chronic combined diastolic and systolic heart failure history of V. tach with cardiac arrest requiring AICD placement.  EF 40 to 45%. Cardiology is following, currently compensated, continue combination of beta-blocker, Isordil and hydralazine, no ACE/ARB due to underlying renal insufficiency, mild troponin bump in non-ACS pattern due to underlying CHF and stress of #1 and 2 above, continue monitoring with supportive care.  Hypertension.  Hypertensive emergency upon admission, currently blood pressure stable on present regimen monitor and adjust.  AKI on CKD 5.  Nephrology following, may require dialysis soon, she does have left upper extremity AV fistula which unfortunately has clotted, VVS following.  May require HD catheter in the short-term.  Left arm AV  fistula.  Clotted off.  VVS following.  Dyslipidemia.  On statin.    DM type II.  On sliding scale and Lantus.  Monitor.  Lab Results  Component Value Date   HGBA1C 7.5 (H) 10/05/2022   CBG (last 3)  Recent Labs    10/08/22 2121 10/09/22 0741 10/09/22 1143  GLUCAP 170* 101* 102*         Condition -  Guarded  Family Communication  :  None  Code Status :  Full  Consults  :  VVS, Renal, Cards, GI, ID  PUD Prophylaxis : PPI   Procedures  :     TTE -  1. Left ventricular ejection fraction, by estimation, is 40 to 45%. The left ventricle has mildly decreased function. The left ventricle demonstrates global hypokinesis. There is mild concentric left ventricular hypertrophy. Left ventricular diastolic parameters are consistent with Grade III diastolic dysfunction (restrictive). Elevated left atrial pressure.  2. Right ventricular systolic function is mildly reduced. The right ventricular size is moderately enlarged. There is moderately elevated pulmonary artery systolic pressure.  3. Left atrial size was moderately dilated.  4. Right atrial size was moderately dilated.  5. The mitral valve is normal in structure. No evidence of mitral valve regurgitation. No evidence of mitral stenosis.  6. Tricuspid valve regurgitation is moderate to severe.  7. The aortic valve is tricuspid. Aortic valve regurgitation is not visualized. No aortic stenosis is present.  8. The inferior vena cava is dilated in size with <50% respiratory variability, suggesting right atrial pressure of 15 mmHg. Comparison(s): Prior images reviewed side by side. The left ventricular function is significantly worse. The right ventricular systolic function is worse  L Arm [place] -  Brachiobasilic AVF appears thrombosed  CT -  No evidence of bowel obstruction. Normal appendix. No CT findings to account for the patient's abdominal pain. Mildly thick-walled bladder, correlate for cystitis. Moderate bilateral pleural effusions with  associated compressive atelectasis in the bilateral lower lobes. Additional patchy anterior right lower lobe opacity, possibly reflecting pneumonia, less likely atelectasis.      Disposition Plan  :  Status is: Inpatient   DVT Prophylaxis  :    Place and maintain sequential compression device Start: 10/07/22 0554  Lab Results  Component Value Date   PLT 181 10/09/2022    Diet :  Diet Order             DIET SOFT Fluid consistency: Thin  Diet effective now                    Inpatient Medications  Scheduled Meds:  [START ON 10/11/2022] aspirin EC  81 mg Oral Daily   Chlorhexidine Gluconate Cloth  6 each Topical Q0600   darbepoetin (ARANESP) injection - DIALYSIS  100 mcg Subcutaneous Q Wed-1800   feeding supplement  237 mL Oral BID BM   furosemide  80 mg Intravenous BID   hydrALAZINE  75 mg Oral Q8H   insulin aspart  0-5 Units Subcutaneous QHS   insulin aspart  0-9 Units Subcutaneous TID WC   insulin glargine-yfgn  10 Units Subcutaneous QHS   isosorbide dinitrate  20 mg Oral TID   levalbuterol  0.63 mg Nebulization BID   metoprolol succinate  50 mg Oral Daily   multivitamin  1 tablet Oral QHS   pantoprazole (PROTONIX) IV  40 mg Intravenous Q12H   pravastatin  40 mg Oral q1800   scopolamine  1 patch Transdermal Q72H   tobramycin  1-2 drop Both Eyes See admin instructions   Continuous Infusions:  ferric gluconate (FERRLECIT) IVPB Stopped (10/07/22 2007)   PRN Meds:.acetaminophen, albuterol, hydrALAZINE, LORazepam, nitroGLYCERIN, oxyCODONE-acetaminophen, prochlorperazine    Objective:   Vitals:   10/09/22 1030 10/09/22 1045 10/09/22 1130 10/09/22 1145  BP: 120/60 134/68 (!) 141/68 (!) 146/75  Pulse: 72 71  70  Resp: (!) 30 (!) '26 19 18  '$ Temp: 98 F (36.7 C) 98.2 F (36.8 C)  98.1 F (36.7 C)  TempSrc: Oral Oral  Oral  SpO2:    98%  Weight:      Height:        Wt Readings from Last 3 Encounters:  10/07/22 81.2 kg  10/01/22 80.3 kg  09/13/22 80.3  kg     Intake/Output Summary (Last 24 hours) at 10/09/2022 1326 Last data filed at 10/09/2022 1130 Gross per 24 hour  Intake 220 ml  Output --  Net 220 ml     Physical Exam  Awake Alert, Oriented X 3,frail,  deconditioned, chronically ill-appearing Symmetrical Chest wall movement, Good air movement bilaterally, CTAB RRR,No Gallops,Rubs or new Murmurs, No Parasternal Heave +ve B.Sounds, Abd Soft, No tenderness, No rebound - guarding or rigidity. No Cyanosis, Clubbing or edema, No new Rash or bruise       Data Review:    Recent Labs  Lab 10/04/22 1000 10/05/22 0242 10/07/22 0256 10/07/22 0754 10/08/22 0606 10/08/22 1627 10/09/22 0602  WBC 7.2   < > 5.7 5.1 6.7 6.1 5.5  HGB 9.4*   < > 7.2* 7.1* 7.1* 7.0* 6.4*  HCT 28.6*   < > 21.4* 21.2* 21.3* 20.7* 20.5*  PLT 288   < > 208 193 202 195 181  MCV 86.9   < > 87.7 87.6 87.3 87.0 88.7  MCH 28.6   < > 29.5 29.3 29.1 29.4 27.7  MCHC 32.9   < > 33.6 33.5 33.3 33.8 31.2  RDW 15.4   < > 15.7* 15.6* 15.7* 16.0* 15.9*  LYMPHSABS 0.5*  --   --   --  0.7 1.0 1.2  MONOABS 0.1  --   --   --  0.3 0.2 0.2  EOSABS 0.0  --   --   --  0.0 0.0 0.1  BASOSABS 0.0  --   --   --  0.0 0.0 0.0   < > = values in this interval not displayed.    Recent Labs  Lab 10/04/22 1000 10/05/22 0242 10/06/22 0730 10/07/22 0256 10/08/22 0606 10/08/22 1055 10/09/22 0602  NA 140 140 137 133* 134*  --  134*  K 4.1 3.8 4.3 3.8 3.7  --  3.5  CL 107 109 106 104 100  --  97*  CO2 23 22 16* 21* 19*  --  23  ANIONGAP '10 9 15 8 15  '$ --  14  GLUCOSE 288* 184* 227* 133* 147*  --  123*  BUN 37* 39* 48* 57* 46*  --  62*  CREATININE 3.61* 4.12* 4.73* 5.31* 4.62*  --  6.21*  AST 9* 14* 42* 125* 59*  --  68*  ALT 14 11 36 96* 84*  --  84*  ALKPHOS 83 63 136* 190* 149*  --  234*  BILITOT 1.0 0.9 0.7 0.6 0.8  --  0.8  ALBUMIN 3.6 2.1* 2.2* 2.3* 2.3*  --  2.1*  CRP  --  2.8* 9.8* 8.8*  --   --   --   DDIMER 6.70*  --   --   --   --   --   --   PROCALCITON  --    --   --   --   --  8.13  --   LATICACIDVEN 1.2  --   --   --   --   --   --   HGBA1C  --  7.5*  --   --   --   --   --   BNP 1,496.7*  --   --   --  2,182.7*  --  1,719.6*  MG  --  1.8 1.7 1.8 1.6*  --  1.8  CALCIUM 8.9 8.0* 8.0* 7.8* 7.5*  --  7.7*      Recent Labs  Lab 10/04/22 1000 10/05/22 0242 10/06/22 0730 10/07/22 0256 10/08/22 0606 10/08/22 1055 10/09/22 0602  CRP  --  2.8* 9.8* 8.8*  --   --   --   DDIMER 6.70*  --   --   --   --   --   --   PROCALCITON  --   --   --   --   --  8.13  --   LATICACIDVEN 1.2  --   --   --   --   --   --   HGBA1C  --  7.5*  --   --   --   --   --   BNP 1,496.7*  --   --   --  2,182.7*  --  1,719.6*  MG  --  1.8 1.7 1.8 1.6*  --  1.8  CALCIUM 8.9 8.0* 8.0* 7.8* 7.5*  --  7.7*    Recent Labs  Lab 10/04/22 1000 10/05/22 0242 10/06/22 0730 10/07/22 0256 10/07/22 0754 10/08/22 0606 10/08/22 1055 10/08/22 1627 10/09/22 0602  WBC 7.2 7.1 6.7 5.7 5.1 6.7  --  6.1 5.5  PLT 288 236 207 208 193 202  --  195 181  CRP  --  2.8* 9.8* 8.8*  --   --   --   --   --   DDIMER 6.70*  --   --   --   --   --   --   --   --  PROCALCITON  --   --   --   --   --   --  8.13  --   --   LATICACIDVEN 1.2  --   --   --   --   --   --   --   --   CREATININE 3.61* 4.12* 4.73* 5.31*  --  4.62*  --   --  6.21*    ------------------------------------------------------------------------------------------------------------------ No results for input(s): "CHOL", "HDL", "LDLCALC", "TRIG", "CHOLHDL", "LDLDIRECT" in the last 72 hours.  Lab Results  Component Value Date   HGBA1C 7.5 (H) 10/05/2022   Radiology Reports DG Chest Port 1 View  Result Date: 10/07/2022 CLINICAL DATA:  Shortness of breath EXAM: PORTABLE CHEST 1 VIEW COMPARISON:  Three days ago FINDINGS: Cardiomegaly and vascular pedicle widening with diffuse interstitial opacity. No air bronchogram. No pneumothorax. ICD lead into the right ventricle. Artifact from EKG leads. IMPRESSION: Suspect mild  CHF. Electronically Signed   By: Jorje Guild M.D.   On: 10/07/2022 06:37   DG Abd Portable 1V  Result Date: 10/07/2022 CLINICAL DATA:  Nausea and vomiting EXAM: PORTABLE ABDOMEN - 1 VIEW COMPARISON:  Abdominal CT from 2 days ago FINDINGS: The bowel gas pattern is normal. No radio-opaque calculi or other significant radiographic abnormality are seen. IMPRESSION: Normal bowel gas pattern. Electronically Signed   By: Jorje Guild M.D.   On: 10/07/2022 06:36   VAS US DUPLEX DIALYSIS ACCESS (AVF, AVG)  Result Date: 10/06/2022 DIALYSIS ACCESS Patient Name:  LOUVENIA DOSS  Date of Exam:   10/06/2022 Medical Rec #: [id number]       Accession #:    [id number] Date of Birth: [birth year]      Patient Gender: F Patient Age:   52 years Exam Location:  [facility] Procedure:      VAS US DUPLEX DIALYSIS ACCESS (AVF, AVG) Referring Phys: Lawson Radar --------------------------------------------------------------------------------  Reason for Exam: No palpable thrill for AVF/AVG. Access Site: Left Upper Extremity. Access Type: Brachio-basilic, status post first stage 10/01/22. Comparison Study: No prior study Performing Technologist: Sharion Dove RVS  Examination Guidelines: A complete evaluation includes B-mode imaging, spectral Doppler, color Doppler, and power Doppler as needed of all accessible portions of each vessel. Unilateral testing is considered an integral part of a complete examination. Limited examinations for reoccurring indications may be performed as noted.    Summary: Brachiobasilic AVF appears thrombosed  *See table(s) above for measurements and observations.  Diagnosing physician: Monica Martinez MD Electronically signed by Monica Martinez MD on 10/06/2022 at 1:02:35 PM.   --------------------------------------------------------------------------------   Final    ECHOCARDIOGRAM COMPLETE  Result Date: 10/05/2022    ECHOCARDIOGRAM REPORT   Patient Name:   DAISIA TUNNICLIFF Date of Exam:  10/05/2022 Medical Rec #:  [id number]      Height:       72.0 in Accession #:    [id number]     Weight:       179.5 lb Date of Birth:  [birth year]     BSA:          2.035 m Patient Age:    48 years       BP:           120/72 mmHg Patient Gender: F              HR:           94 bpm. Exam Location:  Inpatient Procedure: 2D Echo, Cardiac Doppler and Color Doppler Indications:  Chest Pain R07.9  History:        Patient has no prior history of Echocardiogram examinations.                 CHF, CAD and Previous Myocardial Infarction; Risk                 Factors:Diabetes and Hypertension.  Sonographer:    Luane School RDCS Referring Phys: [id number] [place]  1. Left ventricular ejection fraction, by estimation, is 40 to 45%. The left ventricle has mildly decreased function. The left ventricle demonstrates global hypokinesis. There is mild concentric left ventricular hypertrophy. Left ventricular diastolic parameters are consistent with Grade III diastolic dysfunction (restrictive). Elevated left atrial pressure.  2. Right ventricular systolic function is mildly reduced. The right ventricular size is moderately enlarged. There is moderately elevated pulmonary artery systolic pressure.  3. Left atrial size was moderately dilated.  4. Right atrial size was moderately dilated.  5. The mitral valve is normal in structure. No evidence of mitral valve regurgitation. No evidence of mitral stenosis.  6. Tricuspid valve regurgitation is moderate to severe.  7. The aortic valve is tricuspid. Aortic valve regurgitation is not visualized. No aortic stenosis is present.  8. The inferior vena cava is dilated in size with <50% respiratory variability, suggesting right atrial pressure of 15 mmHg. Comparison(s): Prior images reviewed side by side. The left ventricular function is significantly worse. The right ventricular systolic function is worse. TR severity and PA hypertension are also worse, with evidence of hypervolemia.  FINDINGS  Left Ventricle: Left ventricular ejection fraction, by estimation, is 40 to 45%. The left ventricle has mildly decreased function. The left ventricle demonstrates global hypokinesis. The left ventricular internal cavity size was normal in size. There is  mild concentric left ventricular hypertrophy. Left ventricular diastolic parameters are consistent with Grade III diastolic dysfunction (restrictive). Elevated left atrial pressure. Right Ventricle: The right ventricular size is moderately enlarged. No increase in right ventricular wall thickness. Right ventricular systolic function is mildly reduced. There is moderately elevated pulmonary artery systolic pressure. The tricuspid regurgitant velocity is 3.05 m/s, and with an assumed right atrial pressure of 15 mmHg, the estimated right ventricular systolic pressure is [id number]. Left Atrium: Left atrial size was moderately dilated. Right Atrium: Right atrial size was moderately dilated. Pericardium: Trivial pericardial effusion is present. Mitral Valve: The mitral valve is normal in structure. There is mild thickening of the mitral valve leaflet(s). Mild mitral annular calcification. No evidence of mitral valve regurgitation. No evidence of mitral valve stenosis. Tricuspid Valve: The tricuspid valve is normal in structure. Tricuspid valve regurgitation is moderate to severe. Aortic Valve: The aortic valve is tricuspid. Aortic valve regurgitation is not visualized. No aortic stenosis is present. Pulmonic Valve: The pulmonic valve was normal in structure. Pulmonic valve regurgitation is trivial. Aorta: The aortic root and ascending aorta are structurally normal, with no evidence of dilitation. Venous: The inferior vena cava is dilated in size with less than 50% respiratory variability, suggesting right atrial pressure of 15 mmHg. IAS/Shunts: No atrial level shunt detected by color flow Doppler. Additional Comments: A device lead is visualized in the right  ventricle. There is pleural effusion in the left lateral region.  LEFT VENTRICLE PLAX 2D LVIDd:         4.70 cm     Diastology LVIDs:         3.80 cm     LV e' medial:    6.53 cm/s  LV PW:         1.30 cm     LV E/e' medial:  16.1 LV IVS:        1.14 cm     LV e' lateral:   8.05 cm/s LVOT diam:     2.00 cm     LV E/e' lateral: 13.0 LV SV:         43 LV SV Index:   21 LVOT Area:     3.14 cm  LV Volumes (MOD) LV vol d, MOD A2C: 85.9 ml LV vol d, MOD A4C: 78.0 ml LV vol s, MOD A2C: 54.3 ml LV vol s, MOD A4C: 53.8 ml LV SV MOD A2C:     31.6 ml LV SV MOD A4C:     78.0 ml LV SV MOD BP:      31.4 ml RIGHT VENTRICLE            IVC RV S prime:     9.90 cm/s  IVC diam: 2.40 cm TAPSE (M-mode): 1.7 cm LEFT ATRIUM           Index        RIGHT ATRIUM           Index LA diam:      4.67 cm 2.30 cm/m   RA Area:     19.40 cm LA Vol (A2C): 82.5 ml 40.55 ml/m  RA Volume:   58.20 ml  28.60 ml/m LA Vol (A4C): 66.5 ml 32.68 ml/m  AORTIC VALVE LVOT Vmax:   77.50 cm/s LVOT Vmean:  58.000 cm/s LVOT VTI:    0.138 m  AORTA Ao Root diam: 3.40 cm Ao Asc diam:  3.30 cm Ao Desc diam: 2.40 cm MITRAL VALVE                TRICUSPID VALVE MV Area (PHT): 6.96 cm     TR Peak grad:   37.2 mmHg MV Decel Time: 109 msec     TR Vmax:        305.00 cm/s MV E velocity: 105.00 cm/s MV A velocity: 41.60 cm/s   SHUNTS MV E/A ratio:  2.52         Systemic VTI:  0.14 m                             Systemic Diam: 2.00 cm Dani Gobble Croitoru MD Electronically signed by Sanda Klein MD Signature Date/Time: 10/05/2022/2:53:24 PM    Final    CT ABDOMEN PELVIS WO CONTRAST  Result Date: 10/05/2022 CLINICAL DATA:  Abdominal pain, vomiting EXAM: CT ABDOMEN AND PELVIS WITHOUT CONTRAST TECHNIQUE: Multidetector CT imaging of the abdomen and pelvis was performed following the standard protocol without IV contrast. RADIATION DOSE REDUCTION: This exam was performed according to the departmental dose-optimization program which includes automated exposure control, adjustment of  the mA and/or kV according to patient size and/or use of iterative reconstruction technique. COMPARISON:  05/23/2022 FINDINGS: Lower chest: Moderate bilateral pleural effusions with associated compressive atelectasis in the bilateral lower lobes. Additional patchy anterior right lower lobe opacity, possibly reflecting pneumonia, less likely atelectasis. Mild lingular and left lower lobe atelectasis. Hepatobiliary: Unenhanced liver is unremarkable. Gallbladder is unremarkable. No intrahepatic or extrahepatic duct dilatation. Pancreas: Within normal limits. Spleen: Within normal limits. Adrenals/Urinary Tract: Adrenal glands are within normal limits. Kidneys are within normal limits. No renal, ureteral, or bladder calculi. No hydronephrosis. Mildly thick-walled bladder. Stomach/Bowel: Stomach is within normal limits.  No evidence of bowel obstruction. Normal appendix (series 3/image 66). No colonic wall thickening or inflammatory changes. Vascular/Lymphatic: No evidence of abdominal aortic aneurysm. No suspicious abdominopelvic lymphadenopathy. Reproductive: Uterus is within normal limits. Bilateral ovaries are within normal limits. Other: Trace pelvic ascites. Musculoskeletal: Visualized osseous structures are within normal limits. IMPRESSION: No evidence of bowel obstruction. Normal appendix. No CT findings to account for the patient's abdominal pain. Mildly thick-walled bladder, correlate for cystitis. Moderate bilateral pleural effusions with associated compressive atelectasis in the bilateral lower lobes. Additional patchy anterior right lower lobe opacity, possibly reflecting pneumonia, less likely atelectasis. Electronically Signed   By: Julian Hy M.D.   On: 10/05/2022 01:13      Signature  -   Phillips Climes M.D on 10/09/2022 at 1:26 PM   -  To page go to [email or web address]

## 2022-10-10 DIAGNOSIS — N185 Chronic kidney disease, stage 5: Secondary | ICD-10-CM | POA: Diagnosis not present

## 2022-10-10 LAB — BPAM RBC
Blood Product Expiration Date: 202403282359
Blood Product Expiration Date: 202403282359
Blood Product Expiration Date: 202404012359
ISSUE DATE / TIME: 202403041006
ISSUE DATE / TIME: 202403061026
ISSUE DATE / TIME: 202403061521
Unit Type and Rh: 6200
Unit Type and Rh: 6200
Unit Type and Rh: 6200

## 2022-10-10 LAB — CBC WITH DIFFERENTIAL/PLATELET
Abs Immature Granulocytes: 0 10*3/uL (ref 0.00–0.07)
Basophils Absolute: 0 10*3/uL (ref 0.0–0.1)
Basophils Relative: 0 %
Eosinophils Absolute: 0.2 10*3/uL (ref 0.0–0.5)
Eosinophils Relative: 3 %
HCT: 25.9 % — ABNORMAL LOW (ref 36.0–46.0)
Hemoglobin: 8.5 g/dL — ABNORMAL LOW (ref 12.0–15.0)
Lymphocytes Relative: 11 %
Lymphs Abs: 0.6 10*3/uL — ABNORMAL LOW (ref 0.7–4.0)
MCH: 28.6 pg (ref 26.0–34.0)
MCHC: 32.8 g/dL (ref 30.0–36.0)
MCV: 87.2 fL (ref 80.0–100.0)
Monocytes Absolute: 0.1 10*3/uL (ref 0.1–1.0)
Monocytes Relative: 2 %
Neutro Abs: 5 10*3/uL (ref 1.7–7.7)
Neutrophils Relative %: 84 %
Platelets: 202 10*3/uL (ref 150–400)
RBC: 2.97 MIL/uL — ABNORMAL LOW (ref 3.87–5.11)
RDW: 15.3 % (ref 11.5–15.5)
WBC: 5.9 10*3/uL (ref 4.0–10.5)
nRBC: 0 % (ref 0.0–0.2)
nRBC: 0 /100 WBC

## 2022-10-10 LAB — COMPREHENSIVE METABOLIC PANEL
ALT: 67 U/L — ABNORMAL HIGH (ref 0–44)
AST: 35 U/L (ref 15–41)
Albumin: 2.3 g/dL — ABNORMAL LOW (ref 3.5–5.0)
Alkaline Phosphatase: 216 U/L — ABNORMAL HIGH (ref 38–126)
Anion gap: 13 (ref 5–15)
BUN: 42 mg/dL — ABNORMAL HIGH (ref 6–20)
CO2: 23 mmol/L (ref 22–32)
Calcium: 7.7 mg/dL — ABNORMAL LOW (ref 8.9–10.3)
Chloride: 100 mmol/L (ref 98–111)
Creatinine, Ser: 4.51 mg/dL — ABNORMAL HIGH (ref 0.44–1.00)
GFR, Estimated: 11 mL/min — ABNORMAL LOW (ref >60)
Glucose, Bld: 249 mg/dL — ABNORMAL HIGH (ref 70–99)
Potassium: 3.5 mmol/L (ref 3.5–5.1)
Sodium: 136 mmol/L (ref 135–145)
Total Bilirubin: 0.9 mg/dL (ref 0.3–1.2)
Total Protein: 5.5 g/dL — ABNORMAL LOW (ref 6.5–8.1)

## 2022-10-10 LAB — TYPE AND SCREEN
ABO/RH(D): A POS
Antibody Screen: NEGATIVE
Unit division: 0
Unit division: 0
Unit division: 0

## 2022-10-10 LAB — GLUCOSE, CAPILLARY
Glucose-Capillary: 253 mg/dL — ABNORMAL HIGH (ref 70–99)
Glucose-Capillary: 252 mg/dL — ABNORMAL HIGH (ref 70–99)
Glucose-Capillary: 205 mg/dL — ABNORMAL HIGH (ref 70–99)
Glucose-Capillary: 189 mg/dL — ABNORMAL HIGH (ref 70–99)

## 2022-10-10 LAB — HEPATITIS B SURFACE ANTIBODY, QUANTITATIVE: Hep B S AB Quant (Post): <3.1 m[IU]/mL — ABNORMAL LOW (ref >9.9)

## 2022-10-10 LAB — MAGNESIUM: Magnesium: 1.8 mg/dL (ref 1.7–2.4)

## 2022-10-10 MED ORDER — AMLODIPINE BESYLATE 10 MG PO TABS
10.0000 mg | ORAL_TABLET | Freq: Every day | ORAL | Status: DC
  Administered 2022-10-10 – 2022-10-15 (×4): 10 mg via ORAL
  Filled 2022-10-10 (×6): qty 1

## 2022-10-10 MED ORDER — FLUCONAZOLE 150 MG PO TABS
150.0000 mg | ORAL_TABLET | Freq: Once | ORAL | Status: AC
  Administered 2022-10-10: 150 mg via ORAL
  Filled 2022-10-10 (×2): qty 1

## 2022-10-10 MED ORDER — HYDROXYZINE HCL 25 MG PO TABS
25.0000 mg | ORAL_TABLET | Freq: Every evening | ORAL | Status: DC | PRN
  Administered 2022-10-11 – 2022-10-13 (×2): 25 mg via ORAL
  Filled 2022-10-10 (×4): qty 1

## 2022-10-10 MED ORDER — PANTOPRAZOLE SODIUM 40 MG PO TBEC
40.0000 mg | DELAYED_RELEASE_TABLET | Freq: Two times a day (BID) | ORAL | Status: DC
  Administered 2022-10-10 – 2022-10-15 (×5): 40 mg via ORAL
  Filled 2022-10-10 (×6): qty 1

## 2022-10-10 MED ORDER — DARBEPOETIN ALFA 100 MCG/0.5ML IJ SOSY
100.0000 ug | PREFILLED_SYRINGE | INTRAMUSCULAR | Status: DC
  Administered 2022-10-10: 100 ug via SUBCUTANEOUS
  Filled 2022-10-10: qty 0.5

## 2022-10-10 NOTE — Progress Notes (Signed)
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 Recent Left AVF placed by Dr. Donnetta Hutching.  Recent progressive CKD in the setting of multiple hospitalization, ongoing gastroparesis flare etc.  She is on antibiotics for pneumonia.   - having some uremic symptoms- nausea and shakiness - started dialysis 10/07/22 - TDC placed by IR, appreciate assistance - HD #1 3/4 after [facility] and also Hep B - appreciate VVS eval of thrombosed AVF - HD #2 10/09/22, HD #3 10/11/22 - CLIP in process   # Possible pneumonia/acute hypoxic respiratory failure/RSV+: s/p ceftriaxone and doxy. currently on oxygen.    # Acute on chronic diastolic CHF with fluid overload/pleural effusion: IV diuretics.  Echo with EF of 40 to 45%, mild concentric LVH.  Noted cardiology is consulted.   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  Corrected calcium and phosphorus level acceptable for CKD.   # Anemia of CKD: - started iron load 3/4 - reorder ESA - since IV looks like it's not working- we can give blood in HD   # Hypertension/volume: Currently on cardiac medication including metoprolol, Imdur, hydralazine.  Diuretics as above.  Blood pressure acceptable.  #fevers: - blood cultures 3/5 - timing appears c/w febrile transfusion reaction- but she has a new TDC too - got one dose of vanc - PCT up to 8.13--> but can be elevated after febrile reactions - closely monitor on dialysis and the new catheter    Subjective:    Blood transfusion and HD yesterday.  Feeling much better.  For HD #3 tomorrow.     Objective:   BP (!) 183/88 (BP Location: Right Leg)   Pulse 81   Temp 98.2 F (36.8 C) (Oral)   Resp (!) 22   Ht 6' (1.829 m)   Wt 82.2 kg   LMP 11/17/2021 (Approximate)   SpO2 100%   BMI 24.58 kg/m   Physical Exam: [id number] much better  today CVS: RRR Resp: clear Abd: soft Ext: 1+ Le edema ACCESS: L AVF thrombosed, R IJ TDC   Labs: BMET Recent Labs  Lab 10/04/22 1000 10/05/22 0242 10/06/22 0730 10/07/22 0256 10/08/22 0606 10/09/22 0602 10/10/22 0543  NA 140 140 137 133* 134* 134* 136  K 4.1 3.8 4.3 3.8 3.7 3.5 3.5  CL 107 109 106 104 100 97* 100  CO2 23 22 16* 21* 19* 23 23  GLUCOSE 288* 184* 227* 133* 147* 123* 249*  BUN 37* 39* 48* 57* 46* 62* 42*  CREATININE 3.61* 4.12* 4.73* 5.31* 4.62* 6.21* 4.51*  CALCIUM 8.9 8.0* 8.0* 7.8* 7.5* 7.7* 7.7*  PHOS  --  3.8 5.4* 5.9*  --   --   --    CBC Recent Labs  Lab 10/08/22 0606 10/08/22 1627 10/09/22 0602 10/10/22 0543  WBC 6.7 6.1 5.5 5.9  NEUTROABS 5.7 4.8 3.9 5.0  HGB 7.1* 7.0* 6.4* 8.5*  HCT 21.3* 20.7* 20.5* 25.9*  MCV 87.3 87.0 88.7 87.2  PLT 202 195 181 202      Medications:     [START ON 10/11/2022] aspirin EC  81 mg Oral Daily   Chlorhexidine Gluconate Cloth  6 each Topical Q0600   darbepoetin (ARANESP) injection - DIALYSIS  100 mcg Subcutaneous Q Wed-1800   feeding supplement  237 mL Oral BID WC   fluconazole  150 mg Oral  Once   furosemide  80 mg Intravenous BID   hydrALAZINE  75 mg Oral Q8H   insulin aspart  0-5 Units Subcutaneous QHS   insulin aspart  0-9 Units Subcutaneous TID WC   insulin glargine-yfgn  10 Units Subcutaneous QHS   isosorbide dinitrate  20 mg Oral TID   levalbuterol  0.63 mg Nebulization BID   metoprolol succinate  50 mg Oral Daily   multivitamin  1 tablet Oral QHS   pantoprazole (PROTONIX) IV  40 mg Intravenous Q12H   pravastatin  40 mg Oral q1800   scopolamine  1 patch Transdermal Q72H     Madelon Lips MD 10/10/2022, 12:01 PM

## 2022-10-10 NOTE — Progress Notes (Signed)
PT Cancellation Note  Patient Details Name: [name] MRN: [id number] DOB: 1971-06-28   Cancelled Treatment:    Reason Eval/Treat Not Completed: Patient declined, no reason specified. Attempted to see pt this morning but currently declining as she feels as if she needs to use the bathroom and has been sitting on the bed pan, as well as waiting for something from the pharmacy, declining any attempts at mobility and wanting to stay on the bed pan. Acute PT will follow up with pt later as appropriate/available. Thank you.    [name] 10/10/2022, 2:18 PM

## 2022-10-10 NOTE — Progress Notes (Signed)
Occupational Therapy Treatment Patient Details Name: [name] MRN: [id number] DOB: February 03, 1971 Today's Date: 10/10/2022   History of present illness Patient is a 52 y/o female who presents on 3/1 with abdominal pain, N/V, SOB and possible ICD shocks. Found to have RSV PNA, AKI on CKD with likely initiation of dialysis, chronic gastroparesis and thrombosed LUE AVF. PMH includes HTN, DM2, gastroparesis, CKD, CHF, sickle cell trait, depression, neuropathy.   OT comments  Patient received in supine and declined getting OOB but agreed to participate with OT on EOB. Patient was able to perform grooming tasks seated on EOB with increased time and setup. Patient performed static standing from EOB with min assist to stand and tolerating ~1 minute of standing and performed side steps towards HOB. Patient with limited progress this treatment session and would benefit from further OT services to increase activity tolerance and balance to increase independence and safety with self care. Acute OT to continue to follow.    Recommendations for follow up therapy are one component of a multi-disciplinary discharge planning process, led by the attending physician.  Recommendations may be updated based on patient status, additional functional criteria and insurance authorization.    Follow Up Recommendations  Home health OT (pending acute progress)     Assistance Recommended at Discharge Frequent or constant Supervision/Assistance  Patient can return home with the following  A little help with walking and/or transfers;A little help with bathing/dressing/bathroom;Assistance with cooking/housework;Assist for transportation;Help with stairs or ramp for entrance   Equipment Recommendations  Other (comment) (pending acute progress)    Recommendations for Other Services      Precautions / Restrictions Precautions Precautions: Fall Restrictions Weight Bearing Restrictions: No       Mobility Bed  Mobility Overal bed mobility: Needs Assistance Bed Mobility: Supine to Sit, Sit to Supine     Supine to sit: Min assist, HOB elevated Sit to supine: Min assist, HOB elevated   General bed mobility comments: increased time and use of bed rails to get to EOB, assistance with BLEs to return to supine    Transfers Overall transfer level: Needs assistance Equipment used: Rolling walker (2 wheels) Transfers: Sit to/from Stand Sit to Stand: Min assist           General transfer comment: min assist to stand at EOB to RW and performed side steps towards [facility]     Balance Overall balance assessment: Needs assistance Sitting-balance support: Bilateral upper extremity supported, Feet supported Sitting balance-Leahy Scale: Good Sitting balance - Comments: able to perform grooming tasks seated on EOB   Standing balance support: Bilateral upper extremity supported Standing balance-Leahy Scale: Poor Standing balance comment: reliant on BUE support                           ADL either performed or assessed with clinical judgement   ADL Overall ADL's : Needs assistance/impaired     Grooming: Wash/dry hands;Wash/dry face;Oral care;Sitting;Set up Grooming Details (indicate cue type and reason): seated on EOB                               General ADL Comments: limited by weakness    Extremity/Trunk Assessment              Vision       Perception     Praxis      Cognition Arousal/Alertness: Awake/alert, Lethargic Behavior During Therapy:  Flat affect Overall Cognitive Status: Within Functional Limits for tasks assessed                                 General Comments: alert at beginning of session and became more lethargic during visit        Exercises      Shoulder Instructions       General Comments On 5lL with SpO2 99 HR 82 and BP 172/83 at end of session    Pertinent Vitals/ Pain       Pain Assessment Pain Assessment:  Faces Faces Pain Scale: Hurts a little bit Pain Location: generalized Pain Descriptors / Indicators: Discomfort Pain Intervention(s): Limited activity within patient's tolerance, Monitored during session, Repositioned  Home Living                                          Prior Functioning/Environment              Frequency  Min 2X/week        Progress Toward Goals  OT Goals(current goals can now be found in the care plan section)  Progress towards OT goals: Progressing toward goals  Acute Rehab OT Goals Patient Stated Goal: get better OT Goal Formulation: With patient Time For Goal Achievement: 10/21/22 Potential to Achieve Goals: Good ADL Goals Pt Will Perform Grooming: with min guard assist;standing Pt Will Perform Lower Body Dressing: with min guard assist;sit to/from stand Pt Will Transfer to Toilet: with min guard assist;ambulating Pt Will Perform Toileting - Clothing Manipulation and hygiene: with modified independence;sitting/lateral leans Additional ADL Goal #1: Pt will complete at least 8x minutes of OOB funcitonal activity to demonstrate increased tolerance  Plan Discharge plan remains appropriate    Co-evaluation                 AM-PAC OT "6 Clicks" Daily Activity     Outcome Measure   Help from another person eating meals?: None Help from another person taking care of personal grooming?: A Little Help from another person toileting, which includes using toliet, bedpan, or urinal?: A Lot Help from another person bathing (including washing, rinsing, drying)?: A Lot Help from another person to put on and taking off regular upper body clothing?: A Little Help from another person to put on and taking off regular lower body clothing?: A Lot 6 Click Score: 16    End of Session Equipment Utilized During Treatment: Rolling walker (2 wheels)  OT Visit Diagnosis: Unsteadiness on feet (R26.81);Other abnormalities of gait and mobility  (R26.89);Muscle weakness (generalized) (M62.81)   Activity Tolerance Patient limited by fatigue   Patient Left in bed;with call bell/phone within reach;with bed alarm set   Nurse Communication Mobility status        Time: [id number] OT Time Calculation (min): 24 min  Charges: OT General Charges $OT Visit: 1 Visit OT Treatments $Self Care/Home Management : 8-22 mins $Therapeutic Activity: 8-22 mins  Lodema Hong, OTA Acute Rehabilitation Services  Office [phone]   Trixie Dredge 10/10/2022, 3:37 PM

## 2022-10-10 NOTE — Progress Notes (Signed)
PROGRESS NOTE                                                                                                                                                                                                             Patient Demographics:    [name], is a 52 y.o. 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   Subjective:   She is afebrile over last 24 hours, reports she is feeling better, appetite and nausea has improved, but still feeling weak and fatigued.  Patient did report to the nurse earlier today she is having vaginal itching, likely due to vaginal candidiasis, and requested Diflucan.    Assessment  & Plan :    Diabetic gastroparesis acute on chronic exacerbation due to RSV viral infection causing recurrent and persistent nausea vomiting.   Continue supportive care, abdominal exam is benign. -Continue with Reglan. - abdominal exam KUB stable. -Uremia from renal failure may be contributing, nausea has improved today after starting dialysis -GI signed off  Fevers  starting 10/07/2022.   -Follow-up blood cultures, so far no growth to date. -Felt to be secondary to transfusion reaction, discussed with Dr. Alvy Bimler , she recommends Benadryl and will before transfusion, as well can add steroids if needed.  Chest x-ray and recent UA unremarkable. -So far remains afebrile   AKI on CKD 5.  -started on HD -Management per renal  RSV viral pneumonitis.  Currently not hypoxic.  No acute issues, this has clinically resolved.  Acute on chronic anemia.  Does have underlying anemia of chronic disease with acute drop due to multiple blood draws and acute illness, also could have some mild upper GI bleed due to persistent nausea vomiting causing Mallory-Weiss tear, IV PPI, hold heparin and aspirin for few days, she was ordered 1 unit of packed RBC transfusion on 10/07/2022 but had a mild febrile transfusion reaction, transfusion labs checked which are stable, case discussed with pathologist on call on 10/08/2022.  All changes consistent with mild febrile transfusion reaction. -Achieved 1 unit PRBC yesterday with premedication for hemoglobin of 6.4, no fever .  Chronic combined diastolic and systolic heart failure history of V. tach with cardiac arrest requiring AICD placement.  EF 40 to 45%. Cardiology is following, currently compensated, continue combination of beta-blocker, Isordil and hydralazine, no ACE/ARB due to underlying renal insufficiency, mild troponin bump in non-ACS pattern due to underlying CHF and stress of #1 and 2 above, continue monitoring with supportive care.  Hypertension.  Blood pressure remains significantly elevated despite being on metoprolol, Isordil, hydralazine, will add amlodipine, hopefully blood pressure should gradually improve with dialysis.    Left arm AV fistula.  Clotted off.  VVS following.  Dyslipidemia.  On statin.    DM type II.  On sliding scale and Lantus.  Monitor.  Lab Results  Component Value Date   HGBA1C 7.5 (H) 10/05/2022    CBG (last 3)  Recent Labs    10/09/22 2116 10/10/22 0844 10/10/22 1211  GLUCAP 177* 253* 252*         Condition -  Guarded  Family Communication  :  None  Code Status :  Full  Consults  :  VVS, Renal, Cards, GI, ID  PUD Prophylaxis : PPI   Procedures  :     TTE -  1. Left ventricular ejection fraction, by estimation, is 40 to 45%. The left ventricle has mildly decreased function. The left ventricle demonstrates global hypokinesis. There is mild concentric left ventricular hypertrophy. Left ventricular diastolic parameters are consistent with Grade III diastolic dysfunction (restrictive). Elevated left atrial pressure.  2. Right ventricular systolic function is mildly reduced. The right ventricular size is moderately enlarged. There is moderately elevated pulmonary artery systolic pressure.  3. Left atrial size was moderately dilated.  4. Right atrial size was moderately dilated.  5. The mitral valve is normal in structure. No evidence of mitral valve regurgitation. No evidence of mitral stenosis.  6. Tricuspid valve regurgitation is moderate to severe.  7. The aortic valve is tricuspid. Aortic valve regurgitation is not visualized. No aortic stenosis is present.  8. The inferior vena cava is dilated in size with <50% respiratory variability, suggesting right atrial pressure of 15 mmHg. Comparison(s): Prior images reviewed side by side. The left ventricular function is significantly worse. The right ventricular systolic function is worse  L Arm [place] -  Brachiobasilic AVF appears thrombosed  CT -  No evidence of bowel obstruction. Normal appendix. No CT findings to account for the patient's abdominal pain. Mildly thick-walled bladder, correlate for cystitis. Moderate bilateral pleural effusions with associated compressive atelectasis in the bilateral lower lobes. Additional patchy anterior right lower lobe opacity, possibly reflecting pneumonia, less likely atelectasis.       Disposition Plan  :    Status is: Inpatient   DVT Prophylaxis  :    Place and maintain sequential compression device Start: 10/07/22 0554  Lab Results  Component Value Date   PLT 202 10/10/2022  Diet :  Diet Order             DIET SOFT Fluid consistency: Thin  Diet effective now                    Inpatient Medications  Scheduled Meds:  [START ON 10/11/2022] aspirin EC  81 mg Oral Daily   Chlorhexidine Gluconate Cloth  6 each Topical Q0600   darbepoetin (ARANESP) injection - DIALYSIS  100 mcg Subcutaneous Q Thu-1800   feeding supplement  237 mL Oral BID WC   furosemide  80 mg Intravenous BID   hydrALAZINE  75 mg Oral Q8H   insulin aspart  0-5 Units Subcutaneous QHS   insulin aspart  0-9 Units Subcutaneous TID WC   insulin glargine-yfgn  10 Units Subcutaneous QHS   isosorbide dinitrate  20 mg Oral TID   levalbuterol  0.63 mg Nebulization BID   metoprolol succinate  50 mg Oral Daily   multivitamin  1 tablet Oral QHS   pantoprazole  40 mg Oral BID AC   pravastatin  40 mg Oral q1800   scopolamine  1 patch Transdermal Q72H   Continuous Infusions:  ferric gluconate (FERRLECIT) IVPB 250 mg (10/09/22 1701)   PRN Meds:.acetaminophen, albuterol, hydrALAZINE, LORazepam, nitroGLYCERIN, oxyCODONE-acetaminophen, prochlorperazine    Objective:   Vitals:   10/09/22 2331 10/10/22 0832 10/10/22 0845 10/10/22 0850  BP: (!) 187/80  (!) 183/88   Pulse: 76  79 81  Resp: 20  18 (!) 22  Temp:   98.2 F (36.8 C)   TempSrc:   Oral   SpO2: 97% 100%  100%  Weight:      Height:        Wt Readings from Last 3 Encounters:  10/09/22 82.2 kg  10/01/22 80.3 kg  09/13/22 80.3 kg     Intake/Output Summary (Last 24 hours) at 10/10/2022 1505 Last data filed at 10/09/2022 1808 Gross per 24 hour  Intake 685 ml  Output 500 ml  Net 185 ml     Physical Exam  Awake Alert, Oriented X 3,frail,  deconditioned, chronically ill-appearing Symmetrical Chest wall movement, Good air  movement bilaterally, CTAB RRR,No Gallops,Rubs or new Murmurs, No Parasternal Heave +ve B.Sounds, Abd Soft, No tenderness, No rebound - guarding or rigidity. No Cyanosis, Clubbing or edema, No new Rash or bruise       Data Review:    Recent Labs  Lab 10/04/22 1000 10/05/22 0242 10/07/22 0754 10/08/22 0606 10/08/22 1627 10/09/22 0602 10/10/22 0543  WBC 7.2   < > 5.1 6.7 6.1 5.5 5.9  HGB 9.4*   < > 7.1* 7.1* 7.0* 6.4* 8.5*  HCT 28.6*   < > 21.2* 21.3* 20.7* 20.5* 25.9*  PLT 288   < > 193 202 195 181 202  MCV 86.9   < > 87.6 87.3 87.0 88.7 87.2  MCH 28.6   < > 29.3 29.1 29.4 27.7 28.6  MCHC 32.9   < > 33.5 33.3 33.8 31.2 32.8  RDW 15.4   < > 15.6* 15.7* 16.0* 15.9* 15.3  LYMPHSABS 0.5*  --   --  0.7 1.0 1.2 0.6*  MONOABS 0.1  --   --  0.3 0.2 0.2 0.1  EOSABS 0.0  --   --  0.0 0.0 0.1 0.2  BASOSABS 0.0  --   --  0.0 0.0 0.0 0.0   < > = values in this interval not displayed.    Recent Labs  Lab 10/04/22 1000 10/04/22  1000 10/05/22 0242 10/06/22 0730 10/07/22 0256 10/08/22 0606 10/08/22 1055 10/09/22 0602 10/10/22 0543  NA 140  --  140 137 133* 134*  --  134* 136  K 4.1  --  3.8 4.3 3.8 3.7  --  3.5 3.5  CL 107  --  109 106 104 100  --  97* 100  CO2 23  --  22 16* 21* 19*  --  23 23  ANIONGAP 10  --  '9 15 8 15  '$ --  14 13  GLUCOSE 288*  --  184* 227* 133* 147*  --  123* 249*  BUN 37*  --  39* 48* 57* 46*  --  62* 42*  CREATININE 3.61*  --  4.12* 4.73* 5.31* 4.62*  --  6.21* 4.51*  AST 9*  --  14* 42* 125* 59*  --  68* 35  ALT 14  --  11 36 96* 84*  --  84* 67*  ALKPHOS 83  --  63 136* 190* 149*  --  234* 216*  BILITOT 1.0  --  0.9 0.7 0.6 0.8  --  0.8 0.9  ALBUMIN 3.6  --  2.1* 2.2* 2.3* 2.3*  --  2.1* 2.3*  CRP  --   --  2.8* 9.8* 8.8*  --   --   --   --   DDIMER 6.70*  --   --   --   --   --   --   --   --   PROCALCITON  --   --   --   --   --   --  8.13  --   --   LATICACIDVEN 1.2  --   --   --   --   --   --   --   --   HGBA1C  --   --  7.5*  --   --   --    --   --   --   BNP 1,496.7*  --   --   --   --  2,182.7*  --  1,719.6*  --   MG  --    < > 1.8 1.7 1.8 1.6*  --  1.8 1.8  CALCIUM 8.9  --  8.0* 8.0* 7.8* 7.5*  --  7.7* 7.7*   < > = values in this interval not displayed.      Recent Labs  Lab 10/04/22 1000 10/04/22 1000 10/05/22 0242 10/06/22 0730 10/07/22 0256 10/08/22 0606 10/08/22 1055 10/09/22 0602 10/10/22 0543  CRP  --   --  2.8* 9.8* 8.8*  --   --   --   --   DDIMER 6.70*  --   --   --   --   --   --   --   --   PROCALCITON  --   --   --   --   --   --  8.13  --   --   LATICACIDVEN 1.2  --   --   --   --   --   --   --   --   HGBA1C  --   --  7.5*  --   --   --   --   --   --   BNP 1,496.7*  --   --   --   --  2,182.7*  --  1,719.6*  --   MG  --    < > 1.8  1.7 1.8 1.6*  --  1.8 1.8  CALCIUM 8.9  --  8.0* 8.0* 7.8* 7.5*  --  7.7* 7.7*   < > = values in this interval not displayed.    Recent Labs  Lab 10/04/22 1000 10/05/22 0242 10/06/22 0730 10/07/22 0256 10/07/22 0754 10/08/22 0606 10/08/22 1055 10/08/22 1627 10/09/22 0602 10/10/22 0543  WBC 7.2 7.1 6.7 5.7 5.1 6.7  --  6.1 5.5 5.9  PLT 288 236 207 208 193 202  --  195 181 202  CRP  --  2.8* 9.8* 8.8*  --   --   --   --   --   --   DDIMER 6.70*  --   --   --   --   --   --   --   --   --   PROCALCITON  --   --   --   --   --   --  8.13  --   --   --   LATICACIDVEN 1.2  --   --   --   --   --   --   --   --   --   CREATININE 3.61* 4.12* 4.73* 5.31*  --  4.62*  --   --  6.21* 4.51*    ------------------------------------------------------------------------------------------------------------------ No results for input(s): "CHOL", "HDL", "LDLCALC", "TRIG", "CHOLHDL", "LDLDIRECT" in the last 72 hours.  Lab Results  Component Value Date   HGBA1C 7.5 (H) 10/05/2022   Radiology Reports DG Chest Port 1 View  Result Date: 10/07/2022 CLINICAL DATA:  Shortness of breath EXAM: PORTABLE CHEST 1 VIEW COMPARISON:  Three days ago FINDINGS: Cardiomegaly and vascular  pedicle widening with diffuse interstitial opacity. No air bronchogram. No pneumothorax. ICD lead into the right ventricle. Artifact from EKG leads. IMPRESSION: Suspect mild CHF. Electronically Signed   By: Jorje Guild M.D.   On: 10/07/2022 06:37   DG Abd Portable 1V  Result Date: 10/07/2022 CLINICAL DATA:  Nausea and vomiting EXAM: PORTABLE ABDOMEN - 1 VIEW COMPARISON:  Abdominal CT from 2 days ago FINDINGS: The bowel gas pattern is normal. No radio-opaque calculi or other significant radiographic abnormality are seen. IMPRESSION: Normal bowel gas pattern. Electronically Signed   By: Jorje Guild M.D.   On: 10/07/2022 06:36   VAS US DUPLEX DIALYSIS ACCESS (AVF, AVG)  Result Date: 10/06/2022 DIALYSIS ACCESS Patient Name:  [name]  Date of Exam:   10/06/2022 Medical Rec #: [id number]       Accession #:    [id number] Date of Birth: November 05, 1970      Patient Gender: F Patient Age:   36 years Exam Location:  [facility] Procedure:      VAS US DUPLEX DIALYSIS ACCESS (AVF, AVG) Referring Phys: Lawson Radar --------------------------------------------------------------------------------  Reason for Exam: No palpable thrill for AVF/AVG. Access Site: Left Upper Extremity. Access Type: Brachio-basilic, status post first stage 10/01/22. Comparison Study: No prior study Performing Technologist: Sharion Dove RVS  Examination Guidelines: A complete evaluation includes B-mode imaging, spectral Doppler, color Doppler, and power Doppler as needed of all accessible portions of each vessel. Unilateral testing is considered an integral part of a complete examination. Limited examinations for reoccurring indications may be performed as noted.    Summary: Brachiobasilic AVF appears thrombosed  *See table(s) above for measurements and observations.  Diagnosing physician: Monica Martinez MD Electronically signed by Monica Martinez MD on 10/06/2022 at 1:02:35 PM.    --------------------------------------------------------------------------------   Final  ECHOCARDIOGRAM COMPLETE  Result Date: 10/05/2022    ECHOCARDIOGRAM REPORT   Patient Name:   [name] Date of Exam: 10/05/2022 Medical Rec #:  [id number]      Height:       72.0 in Accession #:    [id number]     Weight:       179.5 lb Date of Birth:  1971-07-07     BSA:          2.035 m Patient Age:    11 years       BP:           120/72 mmHg Patient Gender: F              HR:           94 bpm. Exam Location:  Inpatient Procedure: 2D Echo, Cardiac Doppler and Color Doppler Indications:    Chest Pain R07.9  History:        Patient has no prior history of Echocardiogram examinations.                 CHF, CAD and Previous Myocardial Infarction; Risk                 Factors:Diabetes and Hypertension.  Sonographer:    Luane School RDCS Referring Phys: [id number] Kemp  1. Left ventricular ejection fraction, by estimation, is 40 to 45%. The left ventricle has mildly decreased function. The left ventricle demonstrates global hypokinesis. There is mild concentric left ventricular hypertrophy. Left ventricular diastolic parameters are consistent with Grade III diastolic dysfunction (restrictive). Elevated left atrial pressure.  2. Right ventricular systolic function is mildly reduced. The right ventricular size is moderately enlarged. There is moderately elevated pulmonary artery systolic pressure.  3. Left atrial size was moderately dilated.  4. Right atrial size was moderately dilated.  5. The mitral valve is normal in structure. No evidence of mitral valve regurgitation. No evidence of mitral stenosis.  6. Tricuspid valve regurgitation is moderate to severe.  7. The aortic valve is tricuspid. Aortic valve regurgitation is not visualized. No aortic stenosis is present.  8. The inferior vena cava is dilated in size with <50% respiratory variability, suggesting right atrial pressure of 15 mmHg. Comparison(s): Prior  images reviewed side by side. The left ventricular function is significantly worse. The right ventricular systolic function is worse. TR severity and PA hypertension are also worse, with evidence of hypervolemia. FINDINGS  Left Ventricle: Left ventricular ejection fraction, by estimation, is 40 to 45%. The left ventricle has mildly decreased function. The left ventricle demonstrates global hypokinesis. The left ventricular internal cavity size was normal in size. There is  mild concentric left ventricular hypertrophy. Left ventricular diastolic parameters are consistent with Grade III diastolic dysfunction (restrictive). Elevated left atrial pressure. Right Ventricle: The right ventricular size is moderately enlarged. No increase in right ventricular wall thickness. Right ventricular systolic function is mildly reduced. There is moderately elevated pulmonary artery systolic pressure. The tricuspid regurgitant velocity is 3.05 m/s, and with an assumed right atrial pressure of 15 mmHg, the estimated right ventricular systolic pressure is [id number]. Left Atrium: Left atrial size was moderately dilated. Right Atrium: Right atrial size was moderately dilated. Pericardium: Trivial pericardial effusion is present. Mitral Valve: The mitral valve is normal in structure. There is mild thickening of the mitral valve leaflet(s). Mild mitral annular calcification. No evidence of mitral valve regurgitation. No evidence of mitral valve stenosis. Tricuspid Valve: The tricuspid valve is normal  in structure. Tricuspid valve regurgitation is moderate to severe. Aortic Valve: The aortic valve is tricuspid. Aortic valve regurgitation is not visualized. No aortic stenosis is present. Pulmonic Valve: The pulmonic valve was normal in structure. Pulmonic valve regurgitation is trivial. Aorta: The aortic root and ascending aorta are structurally normal, with no evidence of dilitation. Venous: The inferior vena cava is dilated in size with  less than 50% respiratory variability, suggesting right atrial pressure of 15 mmHg. IAS/Shunts: No atrial level shunt detected by color flow Doppler. Additional Comments: A device lead is visualized in the right ventricle. There is pleural effusion in the left lateral region.  LEFT VENTRICLE PLAX 2D LVIDd:         4.70 cm     Diastology LVIDs:         3.80 cm     LV e' medial:    6.53 cm/s LV PW:         1.30 cm     LV E/e' medial:  16.1 LV IVS:        1.14 cm     LV e' lateral:   8.05 cm/s LVOT diam:     2.00 cm     LV E/e' lateral: 13.0 LV SV:         43 LV SV Index:   21 LVOT Area:     3.14 cm  LV Volumes (MOD) LV vol d, MOD A2C: 85.9 ml LV vol d, MOD A4C: 78.0 ml LV vol s, MOD A2C: 54.3 ml LV vol s, MOD A4C: 53.8 ml LV SV MOD A2C:     31.6 ml LV SV MOD A4C:     78.0 ml LV SV MOD BP:      31.4 ml RIGHT VENTRICLE            IVC RV S prime:     9.90 cm/s  IVC diam: 2.40 cm TAPSE (M-mode): 1.7 cm LEFT ATRIUM           Index        RIGHT ATRIUM           Index LA diam:      4.67 cm 2.30 cm/m   RA Area:     19.40 cm LA Vol (A2C): 82.5 ml 40.55 ml/m  RA Volume:   58.20 ml  28.60 ml/m LA Vol (A4C): 66.5 ml 32.68 ml/m  AORTIC VALVE LVOT Vmax:   77.50 cm/s LVOT Vmean:  58.000 cm/s LVOT VTI:    0.138 m  AORTA Ao Root diam: 3.40 cm Ao Asc diam:  3.30 cm Ao Desc diam: 2.40 cm MITRAL VALVE                TRICUSPID VALVE MV Area (PHT): 6.96 cm     TR Peak grad:   37.2 mmHg MV Decel Time: 109 msec     TR Vmax:        305.00 cm/s MV E velocity: 105.00 cm/s MV A velocity: 41.60 cm/s   SHUNTS MV E/A ratio:  2.52         Systemic VTI:  0.14 m                             Systemic Diam: 2.00 cm Dani Gobble Croitoru MD Electronically signed by Sanda Klein MD Signature Date/Time: 10/05/2022/2:53:24 PM    Final    CT ABDOMEN PELVIS WO CONTRAST  Result Date: 10/05/2022 CLINICAL DATA:  Abdominal  pain, vomiting EXAM: CT ABDOMEN AND PELVIS WITHOUT CONTRAST TECHNIQUE: Multidetector CT imaging of the abdomen and pelvis was performed  following the standard protocol without IV contrast. RADIATION DOSE REDUCTION: This exam was performed according to the departmental dose-optimization program which includes automated exposure control, adjustment of the mA and/or kV according to patient size and/or use of iterative reconstruction technique. COMPARISON:  05/23/2022 FINDINGS: Lower chest: Moderate bilateral pleural effusions with associated compressive atelectasis in the bilateral lower lobes. Additional patchy anterior right lower lobe opacity, possibly reflecting pneumonia, less likely atelectasis. Mild lingular and left lower lobe atelectasis. Hepatobiliary: Unenhanced liver is unremarkable. Gallbladder is unremarkable. No intrahepatic or extrahepatic duct dilatation. Pancreas: Within normal limits. Spleen: Within normal limits. Adrenals/Urinary Tract: Adrenal glands are within normal limits. Kidneys are within normal limits. No renal, ureteral, or bladder calculi. No hydronephrosis. Mildly thick-walled bladder. Stomach/Bowel: Stomach is within normal limits. No evidence of bowel obstruction. Normal appendix (series 3/image 66). No colonic wall thickening or inflammatory changes. Vascular/Lymphatic: No evidence of abdominal aortic aneurysm. No suspicious abdominopelvic lymphadenopathy. Reproductive: Uterus is within normal limits. Bilateral ovaries are within normal limits. Other: Trace pelvic ascites. Musculoskeletal: Visualized osseous structures are within normal limits. IMPRESSION: No evidence of bowel obstruction. Normal appendix. No CT findings to account for the patient's abdominal pain. Mildly thick-walled bladder, correlate for cystitis. Moderate bilateral pleural effusions with associated compressive atelectasis in the bilateral lower lobes. Additional patchy anterior right lower lobe opacity, possibly reflecting pneumonia, less likely atelectasis. Electronically Signed   By: Julian Hy M.D.   On: 10/05/2022 01:13       Signature  -   Phillips Climes M.D on 10/10/2022 at 3:05 PM   -  To page go to [email or web address]

## 2022-10-11 DIAGNOSIS — N185 Chronic kidney disease, stage 5: Secondary | ICD-10-CM | POA: Diagnosis not present

## 2022-10-11 LAB — CBC WITH DIFFERENTIAL/PLATELET
Abs Immature Granulocytes: 0 10*3/uL (ref 0.00–0.07)
Basophils Absolute: 0 10*3/uL (ref 0.0–0.1)
Basophils Relative: 0 %
Eosinophils Absolute: 0.2 10*3/uL (ref 0.0–0.5)
Eosinophils Relative: 2 %
HCT: 24.5 % — ABNORMAL LOW (ref 36.0–46.0)
Hemoglobin: 7.9 g/dL — ABNORMAL LOW (ref 12.0–15.0)
Lymphocytes Relative: 8 %
Lymphs Abs: 0.7 10*3/uL (ref 0.7–4.0)
MCH: 28.9 pg (ref 26.0–34.0)
MCHC: 32.2 g/dL (ref 30.0–36.0)
MCV: 89.7 fL (ref 80.0–100.0)
Monocytes Absolute: 0.1 10*3/uL (ref 0.1–1.0)
Monocytes Relative: 1 %
Neutro Abs: 7.6 10*3/uL (ref 1.7–7.7)
Neutrophils Relative %: 89 %
Platelets: 240 10*3/uL (ref 150–400)
RBC: 2.73 MIL/uL — ABNORMAL LOW (ref 3.87–5.11)
RDW: 15.7 % — ABNORMAL HIGH (ref 11.5–15.5)
WBC: 8.5 10*3/uL (ref 4.0–10.5)
nRBC: 0 % (ref 0.0–0.2)
nRBC: 0 /100 WBC

## 2022-10-11 LAB — COMPREHENSIVE METABOLIC PANEL
ALT: 46 U/L — ABNORMAL HIGH (ref 0–44)
AST: 23 U/L (ref 15–41)
Albumin: 2.1 g/dL — ABNORMAL LOW (ref 3.5–5.0)
Alkaline Phosphatase: 167 U/L — ABNORMAL HIGH (ref 38–126)
Anion gap: 11 (ref 5–15)
BUN: 66 mg/dL — ABNORMAL HIGH (ref 6–20)
CO2: 26 mmol/L (ref 22–32)
Calcium: 7.6 mg/dL — ABNORMAL LOW (ref 8.9–10.3)
Chloride: 100 mmol/L (ref 98–111)
Creatinine, Ser: 5.26 mg/dL — ABNORMAL HIGH (ref 0.44–1.00)
GFR, Estimated: 9 mL/min — ABNORMAL LOW (ref >60)
Glucose, Bld: 282 mg/dL — ABNORMAL HIGH (ref 70–99)
Potassium: 3.3 mmol/L — ABNORMAL LOW (ref 3.5–5.1)
Sodium: 137 mmol/L (ref 135–145)
Total Bilirubin: 0.6 mg/dL (ref 0.3–1.2)
Total Protein: 5.1 g/dL — ABNORMAL LOW (ref 6.5–8.1)

## 2022-10-11 LAB — GLUCOSE, CAPILLARY
Glucose-Capillary: 167 mg/dL — ABNORMAL HIGH (ref 70–99)
Glucose-Capillary: 209 mg/dL — ABNORMAL HIGH (ref 70–99)
Glucose-Capillary: 250 mg/dL — ABNORMAL HIGH (ref 70–99)

## 2022-10-11 LAB — MAGNESIUM: Magnesium: 1.7 mg/dL (ref 1.7–2.4)

## 2022-10-11 LAB — HEPATITIS B SURFACE ANTIBODY, QUANTITATIVE: Hep B S AB Quant (Post): <3.1 m[IU]/mL — ABNORMAL LOW (ref >9.9)

## 2022-10-11 MED ORDER — GERHARDT'S BUTT CREAM
TOPICAL_CREAM | CUTANEOUS | Status: DC | PRN
  Filled 2022-10-11: qty 1

## 2022-10-11 MED ORDER — HEPARIN SODIUM (PORCINE) 1000 UNIT/ML IJ SOLN
INTRAMUSCULAR | Status: AC
  Administered 2022-10-11: 1000 [IU]
  Filled 2022-10-11: qty 4

## 2022-10-11 NOTE — Progress Notes (Signed)
Physical Therapy Treatment Patient Details Name: [name] MRN: [id number] DOB: [birth year] Today's Date: 10/11/2022   History of Present Illness Patient is a 52 y/o female who presents on 3/1 with abdominal pain, N/V, SOB and possible ICD shocks. Found to have RSV PNA, AKI on CKD with likely initiation of dialysis, chronic gastroparesis and thrombosed LUE AVF. PMH includes HTN, DM2, gastroparesis, CKD, CHF, sickle cell trait, depression, neuropathy.    PT Comments    Pt limited during today's session by fatigue and overall not feeling well but willing to participate. Pt requiring minA for all mobility, able to stand at EOB x2 trials and perform side steps towards the head of the bed with [facility], declining any further ambulation trials or transfers due to fatigue. Pt reports intermittent assist at home, discussed discharge options but pt reports wanting to return home with HHPT. Discussed RW and BSC for DME, updated recommendations. Acute PT will continue to follow up with pt as appropriate to progress mobility.     Recommendations for follow up therapy are one component of a multi-disciplinary discharge planning process, led by the attending physician.  Recommendations may be updated based on patient status, additional functional criteria and insurance authorization.  Follow Up Recommendations  Home health PT     Assistance Recommended at Discharge Intermittent Supervision/Assistance  Patient can return home with the following A little help with walking and/or transfers;A little help with bathing/dressing/bathroom;Help with stairs or ramp for entrance;Assist for transportation;Assistance with cooking/housework   Equipment Recommendations  Rolling walker (2 wheels);BSC/3in1    Recommendations for Other Services       Precautions / Restrictions Precautions Precautions: Fall Restrictions Weight Bearing Restrictions: No     Mobility  Bed Mobility Overal bed mobility: Needs  Assistance Bed Mobility: Supine to Sit, Sit to Supine     Supine to sit: Min assist, HOB elevated Sit to supine: HOB elevated, Mod assist   General bed mobility comments: increased time and use of bed rails, trunk support for supine>sit and assist for BLE for return to supine    Transfers Overall transfer level: Needs assistance Equipment used: 1 person hand held assist Transfers: Sit to/from Stand Sit to Stand: Min assist           General transfer comment: minA to stand at EOB x2 trials    Ambulation/Gait             Pre-gait activities: pt side stepping ~3 steps at EOB prior to return to supine, requiring minA and 2HHA along with increased time     Stairs             Wheelchair Mobility    Modified Rankin (Stroke Patients Only)       Balance Overall balance assessment: Needs assistance Sitting-balance support: Bilateral upper extremity supported, Feet supported Sitting balance-Leahy Scale: Fair Sitting balance - Comments: stable seated eOB   Standing balance support: Bilateral upper extremity supported Standing balance-Leahy Scale: Poor Standing balance comment: reliant on BUE support                            Cognition Arousal/Alertness: Awake/alert, Lethargic Behavior During Therapy: Flat affect Overall Cognitive Status: Within Functional Limits for tasks assessed                                 General Comments: pt reporting overall not feeling well but  pleasant during session, following commands and responding appropriately        Exercises      General Comments General comments (skin integrity, edema, etc.): SPO2 stable on 2L O2 [place]      Pertinent Vitals/Pain Pain Assessment Pain Assessment: Faces Faces Pain Scale: Hurts a little bit Pain Location: generalized Pain Descriptors / Indicators: Discomfort Pain Intervention(s): Monitored during session, Limited activity within patient's tolerance    Home  Living                          Prior Function            PT Goals (current goals can now be found in the care plan section) Acute Rehab PT Goals Patient Stated Goal: to feel better PT Goal Formulation: With patient Time For Goal Achievement: 10/21/22 Potential to Achieve Goals: Fair Progress towards PT goals: Progressing toward goals    Frequency    Min 3X/week      PT Plan Equipment recommendations need to be updated    Co-evaluation              AM-PAC PT "6 Clicks" Mobility   Outcome Measure  Help needed turning from your back to your side while in a flat bed without using bedrails?: A Little Help needed moving from lying on your back to sitting on the side of a flat bed without using bedrails?: A Little Help needed moving to and from a bed to a chair (including a wheelchair)?: A Little Help needed standing up from a chair using your arms (e.g., wheelchair or bedside chair)?: A Little Help needed to walk in hospital room?: A Lot Help needed climbing 3-5 steps with a railing? : Total 6 Click Score: 15    End of Session Equipment Utilized During Treatment: Oxygen Activity Tolerance: Patient limited by fatigue Patient left: in bed;with call bell/phone within reach Nurse Communication: Mobility status PT Visit Diagnosis: Pain;Muscle weakness (generalized) (M62.81);Unsteadiness on feet (R26.81);Difficulty in walking, not elsewhere classified (R26.2) Pain - Right/Left: Right Pain - part of body: Arm     Time: [id number] PT Time Calculation (min) (ACUTE ONLY): 14 min  Charges:  $Therapeutic Activity: 8-22 mins                     Charlynne Cousins, PT DPT Acute Rehabilitation Services Office [phone]    [name] 10/11/2022, 4:28 PM

## 2022-10-11 NOTE — Progress Notes (Signed)
Advised by RN CM that pt is for likely d/c this weekend. Contacted [facility] to advise clinic of pt's likely d/c this weekend and pt will need to start on Monday. Pt advised of out-pt HD arrangements on 3/6 and arrangements added to AVS as well. Contacted renal NP regarding pt's potential d/c this weekend and to request that orders be sent to clinic at d/c for [facility] treatment.   Melven Sartorius Renal Navigator [phone]

## 2022-10-11 NOTE — Progress Notes (Signed)
PROGRESS NOTE                                                                                                                                                                                                             Patient Demographics:    [name], is a 52 y.o. 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   Subjective:   Patient reports she is feeling much better today, no nausea, no vomiting, appetite has improved   Assessment  & Plan :    Diabetic gastroparesis acute on chronic exacerbation due to RSV viral infection causing recurrent and persistent nausea vomiting.   Continue supportive care, abdominal exam is benign. -Continue with Reglan. - abdominal exam KUB stable. -Uremia from renal failure may be contributing, nausea has improved today after starting dialysis -GI signed off  Fevers starting 10/07/2022.   -Follow-up blood cultures, so far no growth to date. -Felt to be secondary to transfusion reaction, discussed with Dr. Alvy Bimler , she recommends Benadryl and  will before transfusion, as well can add steroids if needed.  Chest x-ray and recent UA unremarkable. -So far remains afebrile   AKI on CKD 5.  -started on HD -Management per renal, she is clipped.  RSV viral pneumonitis.  Currently not hypoxic.  No acute issues, this has clinically resolved.  Acute on chronic anemia.   - Does have underlying anemia of chronic disease with acute drop due to multiple blood draws and acute illness, also could  have some mild upper GI bleed due to persistent nausea vomiting causing Mallory-Weiss tear, IV PPI, hold heparin and aspirin for few days, she was ordered 1 unit of packed RBC transfusion on 10/07/2022 but had a mild febrile transfusion reaction, transfusion labs checked which are stable, case discussed with pathologist on call on 10/08/2022.  All changes consistent with mild febrile transfusion reaction. -Received 1 unit PRBC 3/6, hemoglobin remained stable, continue to monitor -Procrit and IV iron per renal  Chronic combined diastolic and systolic heart failure history of V. tach with cardiac arrest requiring AICD placement.  EF 40 to 45%. Cardiology is following, currently compensated, continue combination of beta-blocker, Isordil and hydralazine, no ACE/ARB due to underlying renal insufficiency, mild troponin bump in non-ACS pattern due to underlying CHF and stress of #1 and 2 above, continue monitoring with supportive care.  Hypertension.  Blood pressure remains significantly elevated despite being on metoprolol, Isordil, hydralazine, will add amlodipine, hopefully blood pressure should gradually improve with dialysis.    Left arm AV fistula.  Clotted off.  VVS following.  Dyslipidemia.  On statin.    DM type II.  On sliding scale and Lantus.  Monitor.  Lab Results  Component Value Date   HGBA1C 7.5 (H) 10/05/2022   CBG (last 3)  Recent Labs    10/10/22 1652 10/10/22 2142 10/11/22 1338  GLUCAP 205* 189* 167*         Condition -   Guarded  Family Communication  :  None  Code Status :  Full  Consults  :  VVS, Renal, Cards, GI, ID  PUD Prophylaxis : PPI   Procedures  :     TTE -  1. Left ventricular ejection fraction, by estimation, is 40 to 45%. The left ventricle has mildly decreased function. The left ventricle demonstrates global hypokinesis. There is mild concentric left ventricular hypertrophy. Left ventricular diastolic parameters are consistent with Grade III diastolic dysfunction (restrictive). Elevated left atrial pressure.  2. Right ventricular systolic function is mildly reduced. The right ventricular size is moderately enlarged. There is moderately elevated pulmonary artery systolic pressure.  3. Left atrial size was moderately dilated.  4. Right atrial size was moderately dilated.  5. The mitral valve is normal in structure. No evidence of mitral valve regurgitation. No evidence of mitral stenosis.  6. Tricuspid valve regurgitation is moderate to severe.  7. The aortic valve is tricuspid. Aortic valve regurgitation is not visualized. No aortic stenosis is present.  8. The inferior vena cava is dilated in size with <50% respiratory variability, suggesting right atrial pressure of 15 mmHg. Comparison(s): Prior images reviewed side by side. The left ventricular function is significantly worse. The right ventricular systolic function is worse  L Arm [place] -  Brachiobasilic AVF appears thrombosed  CT -  No evidence of bowel obstruction. Normal appendix. No CT findings to account for the patient's abdominal pain. Mildly thick-walled bladder, correlate for cystitis. Moderate bilateral pleural effusions with associated compressive atelectasis in the bilateral lower lobes. Additional patchy anterior right lower lobe opacity, possibly reflecting pneumonia, less likely atelectasis.      Disposition Plan  :    Status is: Inpatient   DVT Prophylaxis  :    Place and maintain sequential compression device Start: 10/07/22  0554  Lab Results  Component Value Date   PLT 240 10/11/2022    Diet :  Diet Order             Diet renal/carb modified with fluid  restriction Diet-HS Snack? Nothing; Room service appropriate? Yes; Fluid consistency: Thin  Diet effective now                    Inpatient Medications  Scheduled Meds:  amLODipine  10 mg Oral Daily   aspirin EC  81 mg Oral Daily   Chlorhexidine Gluconate Cloth  6 each Topical Q0600   darbepoetin (ARANESP) injection - DIALYSIS  100 mcg Subcutaneous Q Thu-1800   feeding supplement  237 mL Oral BID WC   furosemide  80 mg Intravenous BID   hydrALAZINE  75 mg Oral Q8H   insulin aspart  0-5 Units Subcutaneous QHS   insulin aspart  0-9 Units Subcutaneous TID WC   insulin glargine-yfgn  10 Units Subcutaneous QHS   isosorbide dinitrate  20 mg Oral TID   metoprolol succinate  50 mg Oral Daily   multivitamin  1 tablet Oral QHS   pantoprazole  40 mg Oral BID AC   pravastatin  40 mg Oral q1800   scopolamine  1 patch Transdermal Q72H   Continuous Infusions:  ferric gluconate (FERRLECIT) IVPB 250 mg (10/11/22 0953)   PRN Meds:.acetaminophen, albuterol, hydrALAZINE, hydrOXYzine, LORazepam, nitroGLYCERIN, oxyCODONE-acetaminophen, prochlorperazine    Objective:   Vitals:   10/11/22 1216 10/11/22 1235 10/11/22 1239 10/11/22 1242  BP: (!) 176/82 (!) 177/78 (!) 170/77   Pulse:  72 72   Resp: (!) 25 20    Temp: 98.5 F (36.9 C)     TempSrc: Oral     SpO2: 99% 99%    Weight:    81.7 kg  Height:        Wt Readings from Last 3 Encounters:  10/11/22 81.7 kg  10/01/22 80.3 kg  09/13/22 80.3 kg     Intake/Output Summary (Last 24 hours) at 10/11/2022 1435 Last data filed at 10/11/2022 0600 Gross per 24 hour  Intake 960 ml  Output 950 ml  Net 10 ml     Physical Exam  Awake Alert, Oriented X 3,frail,  deconditioned, chronically ill-appearing Symmetrical Chest wall movement, Good air movement bilaterally, CTAB RRR,No Gallops,Rubs or new  Murmurs, No Parasternal Heave +ve B.Sounds, Abd Soft, No tenderness, No rebound - guarding or rigidity. No Cyanosis, Clubbing or edema, No new Rash or bruise       Data Review:    Recent Labs  Lab 10/08/22 0606 10/08/22 1627 10/09/22 0602 10/10/22 0543 10/11/22 0818  WBC 6.7 6.1 5.5 5.9 8.5  HGB 7.1* 7.0* 6.4* 8.5* 7.9*  HCT 21.3* 20.7* 20.5* 25.9* 24.5*  PLT 202 195 181 202 240  MCV 87.3 87.0 88.7 87.2 89.7  MCH 29.1 29.4 27.7 28.6 28.9  MCHC 33.3 33.8 31.2 32.8 32.2  RDW 15.7* 16.0* 15.9* 15.3 15.7*  LYMPHSABS 0.7 1.0 1.2 0.6* 0.7  MONOABS 0.3 0.2 0.2 0.1 0.1  EOSABS 0.0 0.0 0.1 0.2 0.2  BASOSABS 0.0 0.0 0.0 0.0 0.0    Recent Labs  Lab 10/05/22 0242 10/06/22 0730 10/07/22 0256 10/08/22 0606 10/08/22 1055 10/09/22 0602 10/10/22 0543 10/11/22 0818  NA 140 137 133* 134*  --  134* 136 137  K 3.8 4.3 3.8 3.7  --  3.5 3.5 3.3*  CL 109 106 104 100  --  97* 100 100  CO2 22 16* 21* 19*  --  '23 23 26  '$ ANIONGAP '9 15 8 15  '$ --  '14 13 11  '$ GLUCOSE 184* 227* 133* 147*  --  123* 249* 282*  BUN 39* 48* 57*  46*  --  62* 42* 66*  CREATININE 4.12* 4.73* 5.31* 4.62*  --  6.21* 4.51* 5.26*  AST 14* 42* 125* 59*  --  68* 35 23  ALT 11 36 96* 84*  --  84* 67* 46*  ALKPHOS 63 136* 190* 149*  --  234* 216* 167*  BILITOT 0.9 0.7 0.6 0.8  --  0.8 0.9 0.6  ALBUMIN 2.1* 2.2* 2.3* 2.3*  --  2.1* 2.3* 2.1*  CRP 2.8* 9.8* 8.8*  --   --   --   --   --   PROCALCITON  --   --   --   --  8.13  --   --   --   HGBA1C 7.5*  --   --   --   --   --   --   --   BNP  --   --   --  2,182.7*  --  1,719.6*  --   --   MG 1.8 1.7 1.8 1.6*  --  1.8 1.8 1.7  CALCIUM 8.0* 8.0* 7.8* 7.5*  --  7.7* 7.7* 7.6*      Recent Labs  Lab 10/05/22 0242 10/06/22 0730 10/07/22 0256 10/08/22 0606 10/08/22 1055 10/09/22 0602 10/10/22 0543 10/11/22 0818  CRP 2.8* 9.8* 8.8*  --   --   --   --   --   PROCALCITON  --   --   --   --  8.13  --   --   --   HGBA1C 7.5*  --   --   --   --   --   --   --   BNP  --    --   --  2,182.7*  --  1,719.6*  --   --   MG 1.8 1.7 1.8 1.6*  --  1.8 1.8 1.7  CALCIUM 8.0* 8.0* 7.8* 7.5*  --  7.7* 7.7* 7.6*    Recent Labs  Lab 10/05/22 0242 10/06/22 0730 10/07/22 0256 10/07/22 0754 10/08/22 0606 10/08/22 1055 10/08/22 1627 10/09/22 0602 10/10/22 0543 10/11/22 0818  WBC 7.1 6.7 5.7   < > 6.7  --  6.1 5.5 5.9 8.5  PLT 236 207 208   < > 202  --  195 181 202 240  CRP 2.8* 9.8* 8.8*  --   --   --   --   --   --   --   PROCALCITON  --   --   --   --   --  8.13  --   --   --   --   CREATININE 4.12* 4.73* 5.31*  --  4.62*  --   --  6.21* 4.51* 5.26*   < > = values in this interval not displayed.    ------------------------------------------------------------------------------------------------------------------ No results for input(s): "CHOL", "HDL", "LDLCALC", "TRIG", "CHOLHDL", "LDLDIRECT" in the last 72 hours.  Lab Results  Component Value Date   HGBA1C 7.5 (H) 10/05/2022   Radiology Reports DG Chest Port 1 View  Result Date: 10/07/2022 CLINICAL DATA:  Shortness of breath EXAM: PORTABLE CHEST 1 VIEW COMPARISON:  Three days ago FINDINGS: Cardiomegaly and vascular pedicle widening with diffuse interstitial opacity. No air bronchogram. No pneumothorax. ICD lead into the right ventricle. Artifact from EKG leads. IMPRESSION: Suspect mild CHF. Electronically Signed   By: Jorje Guild M.D.   On: 10/07/2022 06:37   DG Abd Portable 1V  Result Date: 10/07/2022 CLINICAL DATA:  Nausea and vomiting EXAM:  PORTABLE ABDOMEN - 1 VIEW COMPARISON:  Abdominal CT from 2 days ago FINDINGS: The bowel gas pattern is normal. No radio-opaque calculi or other significant radiographic abnormality are seen. IMPRESSION: Normal bowel gas pattern. Electronically Signed   By: Jorje Guild M.D.   On: 10/07/2022 06:36   VAS US DUPLEX DIALYSIS ACCESS (AVF, AVG)  Result Date: 10/06/2022 DIALYSIS ACCESS Patient Name:  [name]  Date of Exam:   10/06/2022 Medical Rec #: [id number]        Accession #:    [id number] Date of Birth: [birth year]      Patient Gender: F Patient Age:   76 years Exam Location:  [facility] Procedure:      VAS US DUPLEX DIALYSIS ACCESS (AVF, AVG) Referring Phys: Lawson Radar --------------------------------------------------------------------------------  Reason for Exam: No palpable thrill for AVF/AVG. Access Site: Left Upper Extremity. Access Type: Brachio-basilic, status post first stage 10/01/22. Comparison Study: No prior study Performing Technologist: Sharion Dove RVS  Examination Guidelines: A complete evaluation includes B-mode imaging, spectral Doppler, color Doppler, and power Doppler as needed of all accessible portions of each vessel. Unilateral testing is considered an integral part of a complete examination. Limited examinations for reoccurring indications may be performed as noted.    Summary: Brachiobasilic AVF appears thrombosed  *See table(s) above for measurements and observations.  Diagnosing physician: Monica Martinez MD Electronically signed by Monica Martinez MD on 10/06/2022 at 1:02:35 PM.   --------------------------------------------------------------------------------   Final    ECHOCARDIOGRAM COMPLETE  Result Date: 10/05/2022    ECHOCARDIOGRAM REPORT   Patient Name:   [name] Date of Exam: 10/05/2022 Medical Rec #:  [id number]      Height:       72.0 in Accession #:    [id number]     Weight:       179.5 lb Date of Birth:  February 18, 1971     BSA:          2.035 m Patient Age:    41 years       BP:           120/72 mmHg Patient Gender: F              HR:           94 bpm. Exam Location:  Inpatient Procedure: 2D Echo, Cardiac Doppler and Color Doppler Indications:    Chest Pain R07.9  History:        Patient has no prior history of Echocardiogram examinations.                 CHF, CAD and Previous Myocardial Infarction; Risk                 Factors:Diabetes and Hypertension.  Sonographer:    Luane School RDCS Referring Phys: [id number]  [place]  1. Left ventricular ejection fraction, by estimation, is 40 to 45%. The left ventricle has mildly decreased function. The left ventricle demonstrates global hypokinesis. There is mild concentric left ventricular hypertrophy. Left ventricular diastolic parameters are consistent with Grade III diastolic dysfunction (restrictive). Elevated left atrial pressure.  2. Right ventricular systolic function is mildly reduced. The right ventricular size is moderately enlarged. There is moderately elevated pulmonary artery systolic pressure.  3. Left atrial size was moderately dilated.  4. Right atrial size was moderately dilated.  5. The mitral valve is normal in structure. No evidence of mitral valve regurgitation. No evidence of mitral stenosis.  6. Tricuspid valve regurgitation is moderate  to severe.  7. The aortic valve is tricuspid. Aortic valve regurgitation is not visualized. No aortic stenosis is present.  8. The inferior vena cava is dilated in size with <50% respiratory variability, suggesting right atrial pressure of 15 mmHg. Comparison(s): Prior images reviewed side by side. The left ventricular function is significantly worse. The right ventricular systolic function is worse. TR severity and PA hypertension are also worse, with evidence of hypervolemia. FINDINGS  Left Ventricle: Left ventricular ejection fraction, by estimation, is 40 to 45%. The left ventricle has mildly decreased function. The left ventricle demonstrates global hypokinesis. The left ventricular internal cavity size was normal in size. There is  mild concentric left ventricular hypertrophy. Left ventricular diastolic parameters are consistent with Grade III diastolic dysfunction (restrictive). Elevated left atrial pressure. Right Ventricle: The right ventricular size is moderately enlarged. No increase in right ventricular wall thickness. Right ventricular systolic function is mildly reduced. There is moderately elevated  pulmonary artery systolic pressure. The tricuspid regurgitant velocity is 3.05 m/s, and with an assumed right atrial pressure of 15 mmHg, the estimated right ventricular systolic pressure is [id number]. Left Atrium: Left atrial size was moderately dilated. Right Atrium: Right atrial size was moderately dilated. Pericardium: Trivial pericardial effusion is present. Mitral Valve: The mitral valve is normal in structure. There is mild thickening of the mitral valve leaflet(s). Mild mitral annular calcification. No evidence of mitral valve regurgitation. No evidence of mitral valve stenosis. Tricuspid Valve: The tricuspid valve is normal in structure. Tricuspid valve regurgitation is moderate to severe. Aortic Valve: The aortic valve is tricuspid. Aortic valve regurgitation is not visualized. No aortic stenosis is present. Pulmonic Valve: The pulmonic valve was normal in structure. Pulmonic valve regurgitation is trivial. Aorta: The aortic root and ascending aorta are structurally normal, with no evidence of dilitation. Venous: The inferior vena cava is dilated in size with less than 50% respiratory variability, suggesting right atrial pressure of 15 mmHg. IAS/Shunts: No atrial level shunt detected by color flow Doppler. Additional Comments: A device lead is visualized in the right ventricle. There is pleural effusion in the left lateral region.  LEFT VENTRICLE PLAX 2D LVIDd:         4.70 cm     Diastology LVIDs:         3.80 cm     LV e' medial:    6.53 cm/s LV PW:         1.30 cm     LV E/e' medial:  16.1 LV IVS:        1.14 cm     LV e' lateral:   8.05 cm/s LVOT diam:     2.00 cm     LV E/e' lateral: 13.0 LV SV:         43 LV SV Index:   21 LVOT Area:     3.14 cm  LV Volumes (MOD) LV vol d, MOD A2C: 85.9 ml LV vol d, MOD A4C: 78.0 ml LV vol s, MOD A2C: 54.3 ml LV vol s, MOD A4C: 53.8 ml LV SV MOD A2C:     31.6 ml LV SV MOD A4C:     78.0 ml LV SV MOD BP:      31.4 ml RIGHT VENTRICLE            IVC RV S prime:     9.90  cm/s  IVC diam: 2.40 cm TAPSE (M-mode): 1.7 cm LEFT ATRIUM  Index        RIGHT ATRIUM           Index LA diam:      4.67 cm 2.30 cm/m   RA Area:     19.40 cm LA Vol (A2C): 82.5 ml 40.55 ml/m  RA Volume:   58.20 ml  28.60 ml/m LA Vol (A4C): 66.5 ml 32.68 ml/m  AORTIC VALVE LVOT Vmax:   77.50 cm/s LVOT Vmean:  58.000 cm/s LVOT VTI:    0.138 m  AORTA Ao Root diam: 3.40 cm Ao Asc diam:  3.30 cm Ao Desc diam: 2.40 cm MITRAL VALVE                TRICUSPID VALVE MV Area (PHT): 6.96 cm     TR Peak grad:   37.2 mmHg MV Decel Time: 109 msec     TR Vmax:        305.00 cm/s MV E velocity: 105.00 cm/s MV A velocity: 41.60 cm/s   SHUNTS MV E/A ratio:  2.52         Systemic VTI:  0.14 m                             Systemic Diam: 2.00 cm Dani Gobble Croitoru MD Electronically signed by Sanda Klein MD Signature Date/Time: 10/05/2022/2:53:24 PM    Final    CT ABDOMEN PELVIS WO CONTRAST  Result Date: 10/05/2022 CLINICAL DATA:  Abdominal pain, vomiting EXAM: CT ABDOMEN AND PELVIS WITHOUT CONTRAST TECHNIQUE: Multidetector CT imaging of the abdomen and pelvis was performed following the standard protocol without IV contrast. RADIATION DOSE REDUCTION: This exam was performed according to the departmental dose-optimization program which includes automated exposure control, adjustment of the mA and/or kV according to patient size and/or use of iterative reconstruction technique. COMPARISON:  05/23/2022 FINDINGS: Lower chest: Moderate bilateral pleural effusions with associated compressive atelectasis in the bilateral lower lobes. Additional patchy anterior right lower lobe opacity, possibly reflecting pneumonia, less likely atelectasis. Mild lingular and left lower lobe atelectasis. Hepatobiliary: Unenhanced liver is unremarkable. Gallbladder is unremarkable. No intrahepatic or extrahepatic duct dilatation. Pancreas: Within normal limits. Spleen: Within normal limits. Adrenals/Urinary Tract: Adrenal glands are within normal  limits. Kidneys are within normal limits. No renal, ureteral, or bladder calculi. No hydronephrosis. Mildly thick-walled bladder. Stomach/Bowel: Stomach is within normal limits. No evidence of bowel obstruction. Normal appendix (series 3/image 66). No colonic wall thickening or inflammatory changes. Vascular/Lymphatic: No evidence of abdominal aortic aneurysm. No suspicious abdominopelvic lymphadenopathy. Reproductive: Uterus is within normal limits. Bilateral ovaries are within normal limits. Other: Trace pelvic ascites. Musculoskeletal: Visualized osseous structures are within normal limits. IMPRESSION: No evidence of bowel obstruction. Normal appendix. No CT findings to account for the patient's abdominal pain. Mildly thick-walled bladder, correlate for cystitis. Moderate bilateral pleural effusions with associated compressive atelectasis in the bilateral lower lobes. Additional patchy anterior right lower lobe opacity, possibly reflecting pneumonia, less likely atelectasis. Electronically Signed   By: Julian Hy M.D.   On: 10/05/2022 01:13      Signature  -   Phillips Climes M.D on 10/11/2022 at 2:35 PM   -  To page go to [email or web address]

## 2022-10-11 NOTE — Progress Notes (Signed)
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 Recent Left AVF placed by Dr. Donnetta Hutching.  Recent progressive CKD in the setting of multiple hospitalization, ongoing gastroparesis flare etc.  She is on antibiotics for pneumonia.   - having some uremic symptoms- nausea and shakiness - started dialysis 10/07/22 - TDC placed by IR, appreciate assistance - HD #1 3/4 after [facility] and also Hep B - appreciate VVS eval of thrombosed AVF - HD #2 10/09/22, HD #3 10/11/22 - CLIP'd   # Possible pneumonia/acute hypoxic respiratory failure/RSV+: s/p ceftriaxone and doxy. currently on oxygen.    # Acute on chronic diastolic CHF with fluid overload/pleural effusion: IV diuretics.  Echo with EF of 40 to 45%, mild concentric LVH.  Noted cardiology is consulted.   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  Corrected calcium and phosphorus level acceptable for CKD.   # Anemia of CKD: - started iron load 3/4 - reorder ESA - since IV looks like it's not working- we can give blood in HD   # Hypertension/volume: Currently on cardiac medication including metoprolol, Imdur, hydralazine.  Diuretics as above.  Blood pressure acceptable.  #fevers: - blood cultures 3/5 - timing appears c/w febrile transfusion reaction- but she has a new TDC too - got one dose of vanc - PCT up to 8.13--> but can be elevated after febrile reactions - closely monitor on dialysis and the new catheter - ID following- stopped vanc 3/6    Subjective:     Seen on HD.  Doing well.  Feels like dialysis is helping her.     Objective:   BP (!) 178/85   Pulse 72   Temp 98.3 F (36.8 C) (Oral)   Resp 15   Ht 6' (1.829 m)   Wt 82.7 kg   LMP 11/17/2021 (Approximate)   SpO2 99%   BMI 24.73 kg/m   Physical Exam: [id number] much better today CVS: RRR Resp:  clear Abd: soft Ext: 1+ Le edema ACCESS: L AVF thrombosed, R IJ TDC   Labs: BMET Recent Labs  Lab 10/05/22 0242 10/06/22 0730 10/07/22 0256 10/08/22 0606 10/09/22 0602 10/10/22 0543 10/11/22 0818  NA 140 137 133* 134* 134* 136 137  K 3.8 4.3 3.8 3.7 3.5 3.5 3.3*  CL 109 106 104 100 97* 100 100  CO2 22 16* 21* 19* '23 23 26  '$ GLUCOSE 184* 227* 133* 147* 123* 249* 282*  BUN 39* 48* 57* 46* 62* 42* 66*  CREATININE 4.12* 4.73* 5.31* 4.62* 6.21* 4.51* 5.26*  CALCIUM 8.0* 8.0* 7.8* 7.5* 7.7* 7.7* 7.6*  PHOS 3.8 5.4* 5.9*  --   --   --   --    CBC Recent Labs  Lab 10/08/22 1627 10/09/22 0602 10/10/22 0543 10/11/22 0818  WBC 6.1 5.5 5.9 8.5  NEUTROABS 4.8 3.9 5.0 7.6  HGB 7.0* 6.4* 8.5* 7.9*  HCT 20.7* 20.5* 25.9* 24.5*  MCV 87.0 88.7 87.2 89.7  PLT 195 181 202 240      Medications:     amLODipine  10 mg Oral Daily   aspirin EC  81 mg Oral Daily   Chlorhexidine Gluconate Cloth  6 each Topical Q0600   darbepoetin (ARANESP) injection - DIALYSIS  100 mcg Subcutaneous Q Thu-1800   feeding supplement  237 mL Oral BID WC   furosemide  80 mg Intravenous BID   heparin sodium (porcine)       hydrALAZINE  75 mg Oral Q8H   insulin aspart  0-5 Units Subcutaneous QHS   insulin aspart  0-9 Units Subcutaneous TID WC   insulin glargine-yfgn  10 Units Subcutaneous QHS   isosorbide dinitrate  20 mg Oral TID   metoprolol succinate  50 mg Oral Daily   multivitamin  1 tablet Oral QHS   pantoprazole  40 mg Oral BID AC   pravastatin  40 mg Oral q1800   scopolamine  1 patch Transdermal Q72H     Madelon Lips MD 10/11/2022, 11:58 AM

## 2022-10-11 NOTE — Progress Notes (Signed)
Received patient in bed to unit.  Alert and oriented.  Informed consent signed and in chart.   [place] duration:3.5  Patient tolerated well.  Transported back to the room  Alert, without acute distress.  Hand-off given to patient's nurse.   Access used: catheter Access issues: none  Total UF removed: 1 L Medication(s) given: Ferric Gluconate 250 mg IV Post HD VS: 177/78 P 72. R 20. O2 sat 99 % in 2L O2 using. Post HD weight: 81.7 kg   [facility]

## 2022-10-11 NOTE — Procedures (Signed)
Patient seen and examined on Hemodialysis. The procedure was supervised and I have made appropriate changes. BP (!) 178/85   Pulse 72   Temp 98.3 F (36.8 C) (Oral)   Resp 15   Ht 6' (1.829 m)   Wt 82.7 kg   LMP 11/17/2021 (Approximate)   SpO2 99%   BMI 24.73 kg/m   QB 400 mL/ min via TDC, UF goal 1L  Tolerating treatment without complaints at this time.   Madelon Lips MD [facility] [phone] 12:00 PM

## 2022-10-11 NOTE — TOC Progression Note (Signed)
Transition of Care [facility]) - Progression Note    Patient Details  Name: [name] MRN: [id number] Date of Birth: Nov 15, 1970  Transition of Care [facility]) CM/SW Contact  Levonne Lapping, RN Phone Number: 10/11/2022, 3:33 PM  Clinical Narrative:    CM met with Patient to discuss transportation to/from HD M/W/F   Patient states she will have Family/Friend transport (if sahe doesn't drive herself) until [place])  can be arranged. CSW printed and filled out  application . CM confirmed information with Patient and form has been faxed. Contact information for TAMS on AVS and patient notified.   RW ordered from [employer].   Patient's Significant other will transport home upon DC.       Expected Discharge Plan: [place] (HD on M/W/F) Barriers to Discharge: Continued Medical Work up  Expected Discharge Plan and Services   Discharge Planning Services: CM Consult Post Acute Care Choice: [place] arrangements for the past 2 months: Single Family Home                 DME Arranged: N/A DME Agency: NA       HH Arranged: PT, OT HH Agency: [place] Date [place]: 10/11/22 Time [place]: 1453 Representative spoke with at [place]: [place] Determinants of Health ([place]) Interventions [place]: No Food Insecurity (10/04/2022)  Housing: [place]  (10/04/2022)  Transportation Needs: No Transportation Needs (10/04/2022)  Utilities: Not At Risk (10/04/2022)  Alcohol Screen: Low Risk  (07/15/2022)  Depression (PHQ2-9): Low Risk  (06/12/2020)  Financial Resource Strain: Low Risk  (07/15/2022)  Tobacco Use: Low Risk  (10/09/2022)    Readmission Risk Interventions    10/09/2022    3:25 PM 09/10/2022   10:49 AM 07/17/2022    9:51 AM  Readmission Risk Prevention Plan  Transportation Screening Complete Complete Complete  Medication Review Press photographer)  Complete Complete Complete  PCP or Specialist appointment within 3-5 days of discharge Complete Complete Complete  HRI or [place] Complete Complete Complete  SW Recovery Care/Counseling Consult Complete Complete   Palliative Care Screening Not Applicable Not Applicable Not [place] Not Applicable Not Applicable Not Applicable

## 2022-10-12 DIAGNOSIS — N186 End stage renal disease: Secondary | ICD-10-CM | POA: Diagnosis not present

## 2022-10-12 DIAGNOSIS — K3184 Gastroparesis: Secondary | ICD-10-CM | POA: Diagnosis not present

## 2022-10-12 LAB — CULTURE, BLOOD (ROUTINE X 2)
Culture: NO GROWTH
Culture: NO GROWTH
Special Requests: ADEQUATE
Special Requests: ADEQUATE

## 2022-10-12 LAB — CBC WITH DIFFERENTIAL/PLATELET
Abs Immature Granulocytes: 0.44 10*3/uL — ABNORMAL HIGH (ref 0.00–0.07)
Basophils Absolute: 0 10*3/uL (ref 0.0–0.1)
Basophils Relative: 0 %
Eosinophils Absolute: 0.3 10*3/uL (ref 0.0–0.5)
Eosinophils Relative: 3 %
HCT: 24.7 % — ABNORMAL LOW (ref 36.0–46.0)
Hemoglobin: 8.2 g/dL — ABNORMAL LOW (ref 12.0–15.0)
Immature Granulocytes: 4 %
Lymphocytes Relative: 23 %
Lymphs Abs: 2.3 10*3/uL (ref 0.7–4.0)
MCH: 29.1 pg (ref 26.0–34.0)
MCHC: 33.2 g/dL (ref 30.0–36.0)
MCV: 87.6 fL (ref 80.0–100.0)
Monocytes Absolute: 0.4 10*3/uL (ref 0.1–1.0)
Monocytes Relative: 4 %
Neutro Abs: 6.5 10*3/uL (ref 1.7–7.7)
Neutrophils Relative %: 66 %
Platelets: 229 10*3/uL (ref 150–400)
RBC: 2.82 MIL/uL — ABNORMAL LOW (ref 3.87–5.11)
RDW: 15.3 % (ref 11.5–15.5)
WBC: 9.9 10*3/uL (ref 4.0–10.5)
nRBC: 0 % (ref 0.0–0.2)

## 2022-10-12 LAB — COMPREHENSIVE METABOLIC PANEL
ALT: 36 U/L (ref 0–44)
AST: 23 U/L (ref 15–41)
Albumin: 2.1 g/dL — ABNORMAL LOW (ref 3.5–5.0)
Alkaline Phosphatase: 169 U/L — ABNORMAL HIGH (ref 38–126)
Anion gap: 10 (ref 5–15)
BUN: 35 mg/dL — ABNORMAL HIGH (ref 6–20)
CO2: 28 mmol/L (ref 22–32)
Calcium: 7.4 mg/dL — ABNORMAL LOW (ref 8.9–10.3)
Chloride: 99 mmol/L (ref 98–111)
Creatinine, Ser: 3.63 mg/dL — ABNORMAL HIGH (ref 0.44–1.00)
GFR, Estimated: 14 mL/min — ABNORMAL LOW (ref >60)
Glucose, Bld: 130 mg/dL — ABNORMAL HIGH (ref 70–99)
Potassium: 3.1 mmol/L — ABNORMAL LOW (ref 3.5–5.1)
Sodium: 137 mmol/L (ref 135–145)
Total Bilirubin: 0.6 mg/dL (ref 0.3–1.2)
Total Protein: 5.2 g/dL — ABNORMAL LOW (ref 6.5–8.1)

## 2022-10-12 LAB — GLUCOSE, CAPILLARY
Glucose-Capillary: 151 mg/dL — ABNORMAL HIGH (ref 70–99)
Glucose-Capillary: 223 mg/dL — ABNORMAL HIGH (ref 70–99)

## 2022-10-12 MED ORDER — LOPERAMIDE HCL 2 MG PO CAPS
2.0000 mg | ORAL_CAPSULE | Freq: Once | ORAL | Status: AC
  Administered 2022-10-12: 2 mg via ORAL
  Filled 2022-10-12: qty 1

## 2022-10-12 MED ORDER — SODIUM CHLORIDE 0.9 % IV SOLN
500.0000 mg | Freq: Three times a day (TID) | INTRAVENOUS | Status: DC
  Administered 2022-10-12 – 2022-10-13 (×3): 500 mg via INTRAVENOUS
  Filled 2022-10-12 (×12): qty 10

## 2022-10-12 NOTE — Progress Notes (Signed)
PROGRESS NOTE                                                                                                                                                                                                             Patient Demographics:    [name], is a 52 y.o. 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   Subjective:   Reports significant nausea and vomiting overnight, unable to tolerate any p.o. intake given her meds.     Assessment  & Plan :    Diabetic gastroparesis acute on chronic exacerbation due to RSV viral infection causing recurrent and persistent nausea vomiting.   -Continue supportive care, abdominal exam is benign. - abdominal exam KUB stable. -Uremia from renal failure may be contributing, nausea has improved today after starting dialysis -GI signed off -Initial improvement of her nausea after starting dialysis, but she is with significant nausea overnight, likely due to her gastroparesis, discussed with her about smaller portions more frequent meals,  will start on IV erythromycin to help with her gastroparesis, she has allergy to Reglan.  Fevers starting 10/07/2022.   -Follow-up blood cultures, so far no growth to date. -Felt to be secondary to transfusion reaction, discussed with Dr. Alvy Bimler , she recommends Benadryl and will before transfusion, as well can add steroids if needed.  Chest x-ray and recent UA unremarkable. -So far remains afebrile   AKI on CKD 5.  -started on HD -Management per renal, she is clipped.  RSV viral pneumonitis.  Currently not hypoxic.  No acute issues, this has clinically resolved.  Acute on chronic anemia.   - Does have underlying anemia of chronic disease with acute drop due to multiple blood draws and acute illness, also could have some mild upper GI bleed due to persistent nausea vomiting causing Mallory-Weiss tear, IV PPI, hold heparin and aspirin for few days, she was ordered 1 unit of packed RBC transfusion on 10/07/2022 but had a mild febrile transfusion reaction, transfusion labs checked which are stable, case discussed with pathologist on call on 10/08/2022.  All changes consistent with mild febrile transfusion reaction. -Received 1 unit PRBC 3/6, hemoglobin remained stable, continue to monitor -Procrit and IV iron per renal  Chronic combined diastolic and systolic heart failure history of V. tach with cardiac arrest requiring AICD placement.  EF 40 to 45%. Cardiology is following, currently compensated, continue combination of beta-blocker, Isordil and hydralazine, no ACE/ARB due to underlying renal insufficiency, mild troponin bump in non-ACS pattern due to underlying CHF and stress of #1 and 2 above, continue monitoring with supportive care.  Hypertension.  Blood pressure remains significantly elevated despite being on metoprolol, Isordil, hydralazine, will add amlodipine, hopefully blood pressure should gradually improve with dialysis.    Left arm AV fistula.  Clotted off.  VVS  following.  Dyslipidemia.  On statin.    DM type II.  On sliding scale and Lantus.  Monitor.  Lab Results  Component Value Date   HGBA1C 7.5 (H) 10/05/2022   CBG (last 3)  Recent Labs    10/11/22 1338 10/11/22 1551 10/11/22 2019  GLUCAP 167* 209* 250*         Condition -  Guarded  Family Communication  :  None  Code Status :  Full  Consults  :  VVS, Renal, Cards, GI, ID  PUD Prophylaxis : PPI   Procedures  :     TTE -  1. Left ventricular ejection fraction, by estimation, is 40 to 45%. The left ventricle has mildly decreased function. The left ventricle demonstrates global hypokinesis. There is mild concentric left ventricular hypertrophy. Left ventricular diastolic parameters are consistent with Grade III diastolic dysfunction (restrictive). Elevated left atrial pressure.  2. Right ventricular systolic function is mildly reduced. The right ventricular size is moderately enlarged. There is moderately elevated pulmonary artery systolic pressure.  3. Left atrial size was moderately dilated.  4. Right atrial size was moderately dilated.  5. The mitral valve is normal in structure. No evidence of mitral valve regurgitation. No evidence of mitral stenosis.  6. Tricuspid valve regurgitation is moderate to severe.  7. The aortic valve is tricuspid. Aortic valve regurgitation is not visualized. No aortic stenosis is present.  8. The inferior vena cava is dilated in size with <50% respiratory variability, suggesting right atrial pressure of 15 mmHg. Comparison(s): Prior images reviewed side by side. The left ventricular function is significantly worse. The right ventricular systolic function is worse  L Arm [place] -  Brachiobasilic AVF appears thrombosed  CT -  No evidence of bowel obstruction. Normal appendix. No CT findings to account for the patient's abdominal pain. Mildly thick-walled bladder, correlate for cystitis. Moderate bilateral pleural effusions with associated compressive  atelectasis in the bilateral lower lobes. Additional patchy anterior right lower lobe opacity, possibly reflecting pneumonia, less likely atelectasis.      Disposition Plan  :    Status is: Inpatient   DVT Prophylaxis  :    Place and maintain  sequential compression device Start: 10/07/22 0554  Lab Results  Component Value Date   PLT 229 10/12/2022    Diet :  Diet Order             Diet renal/carb modified with fluid restriction Diet-HS Snack? Nothing; Room service appropriate? Yes; Fluid consistency: Thin  Diet effective now                    Inpatient Medications  Scheduled Meds:  amLODipine  10 mg Oral Daily   aspirin EC  81 mg Oral Daily   Chlorhexidine Gluconate Cloth  6 each Topical Q0600   darbepoetin (ARANESP) injection - DIALYSIS  100 mcg Subcutaneous Q Thu-1800   feeding supplement  237 mL Oral BID WC   hydrALAZINE  75 mg Oral Q8H   insulin aspart  0-5 Units Subcutaneous QHS   insulin aspart  0-9 Units Subcutaneous TID WC   insulin glargine-yfgn  10 Units Subcutaneous QHS   isosorbide dinitrate  20 mg Oral TID   metoprolol succinate  50 mg Oral Daily   multivitamin  1 tablet Oral QHS   pantoprazole  40 mg Oral BID AC   pravastatin  40 mg Oral q1800   scopolamine  1 patch Transdermal Q72H   Continuous Infusions:  erythromycin 500 mg (10/12/22 1158)   ferric gluconate (FERRLECIT) IVPB 250 mg (10/11/22 0953)   PRN Meds:.acetaminophen, albuterol, Gerhardt's butt cream, hydrALAZINE, hydrOXYzine, LORazepam, nitroGLYCERIN, oxyCODONE-acetaminophen, prochlorperazine    Objective:   Vitals:   10/12/22 0000 10/12/22 0400 10/12/22 0822 10/12/22 1151  BP: 126/60 133/76 (!) 150/66 (!) 144/69  Pulse: 72 72 72 78  Resp: (!) 21 (!) 22 (!) 22 (!) 23  Temp: 98.6 F (37 C) 98.8 F (37.1 C) 98 F (36.7 C) 98.5 F (36.9 C)  TempSrc: Oral Oral Oral Oral  SpO2: 93% 100% 100% 94%  Weight:      Height:        Wt Readings from Last 3 Encounters:  10/11/22  81.7 kg  10/01/22 80.3 kg  09/13/22 80.3 kg     Intake/Output Summary (Last 24 hours) at 10/12/2022 1250 Last data filed at 10/12/2022 0900 Gross per 24 hour  Intake 720 ml  Output 500 ml  Net 220 ml     Physical Exam  Awake Alert, Oriented X 3,frail,  deconditioned, chronically ill-appearing, appears uncomfortable due to nausea Symmetrical Chest wall movement, Good air movement bilaterally, CTAB RRR,No Gallops,Rubs or new Murmurs, No Parasternal Heave +ve B.Sounds, Abd Soft, No tenderness, No rebound - guarding or rigidity. No Cyanosis, Clubbing or edema, No new Rash or bruise        Data Review:    Recent Labs  Lab 10/08/22 1627 10/09/22 0602 10/10/22 0543 10/11/22 0818 10/12/22 0329  WBC 6.1 5.5 5.9 8.5 9.9  HGB 7.0* 6.4* 8.5* 7.9* 8.2*  HCT 20.7* 20.5* 25.9* 24.5* 24.7*  PLT 195 181 202 240 229  MCV 87.0 88.7 87.2 89.7 87.6  MCH 29.4 27.7 28.6 28.9 29.1  MCHC 33.8 31.2 32.8 32.2 33.2  RDW 16.0* 15.9* 15.3 15.7* 15.3  LYMPHSABS 1.0 1.2 0.6* 0.7 2.3  MONOABS 0.2 0.2 0.1 0.1 0.4  EOSABS 0.0 0.1 0.2 0.2 0.3  BASOSABS 0.0 0.0 0.0 0.0 0.0    Recent Labs  Lab 10/06/22 0730 10/07/22 0256 10/08/22 0606 10/08/22 1055 10/09/22 0602 10/10/22 0543 10/11/22 0818 10/12/22 0329  NA 137 133* 134*  --  134* 136 137 137  K 4.3 3.8  3.7  --  3.5 3.5 3.3* 3.1*  CL 106 104 100  --  97* 100 100 99  CO2 16* 21* 19*  --  '23 23 26 28  '$ ANIONGAP '15 8 15  '$ --  '14 13 11 10  '$ GLUCOSE 227* 133* 147*  --  123* 249* 282* 130*  BUN 48* 57* 46*  --  62* 42* 66* 35*  CREATININE 4.73* 5.31* 4.62*  --  6.21* 4.51* 5.26* 3.63*  AST 42* 125* 59*  --  68* 35 23 23  ALT 36 96* 84*  --  84* 67* 46* 36  ALKPHOS 136* 190* 149*  --  234* 216* 167* 169*  BILITOT 0.7 0.6 0.8  --  0.8 0.9 0.6 0.6  ALBUMIN 2.2* 2.3* 2.3*  --  2.1* 2.3* 2.1* 2.1*  CRP 9.8* 8.8*  --   --   --   --   --   --   PROCALCITON  --   --   --  8.13  --   --   --   --   BNP  --   --  2,182.7*  --  1,719.6*  --   --   --    MG 1.7 1.8 1.6*  --  1.8 1.8 1.7  --   CALCIUM 8.0* 7.8* 7.5*  --  7.7* 7.7* 7.6* 7.4*      Recent Labs  Lab 10/06/22 0730 10/07/22 0256 10/08/22 0606 10/08/22 1055 10/09/22 0602 10/10/22 0543 10/11/22 0818 10/12/22 0329  CRP 9.8* 8.8*  --   --   --   --   --   --   PROCALCITON  --   --   --  8.13  --   --   --   --   BNP  --   --  2,182.7*  --  1,719.6*  --   --   --   MG 1.7 1.8 1.6*  --  1.8 1.8 1.7  --   CALCIUM 8.0* 7.8* 7.5*  --  7.7* 7.7* 7.6* 7.4*    Recent Labs  Lab 10/06/22 0730 10/07/22 0256 10/07/22 0754 10/08/22 0606 10/08/22 1055 10/08/22 1627 10/09/22 0602 10/10/22 0543 10/11/22 0818 10/12/22 0329  WBC 6.7 5.7   < > 6.7  --  6.1 5.5 5.9 8.5 9.9  PLT 207 208   < > 202  --  195 181 202 240 229  CRP 9.8* 8.8*  --   --   --   --   --   --   --   --   PROCALCITON  --   --   --   --  8.13  --   --   --   --   --   CREATININE 4.73* 5.31*  --  4.62*  --   --  6.21* 4.51* 5.26* 3.63*   < > = values in this interval not displayed.    ------------------------------------------------------------------------------------------------------------------ No results for input(s): "CHOL", "HDL", "LDLCALC", "TRIG", "CHOLHDL", "LDLDIRECT" in the last 72 hours.  Lab Results  Component Value Date   HGBA1C 7.5 (H) 10/05/2022   Radiology Reports DG Chest Port 1 View  Result Date: 10/07/2022 CLINICAL DATA:  Shortness of breath EXAM: PORTABLE CHEST 1 VIEW COMPARISON:  Three days ago FINDINGS: Cardiomegaly and vascular pedicle widening with diffuse interstitial opacity. No air bronchogram. No pneumothorax. ICD lead into the right ventricle. Artifact from EKG leads. IMPRESSION: Suspect mild CHF. Electronically Signed   By: Jorje Guild  M.D.   On: 10/07/2022 06:37   DG Abd Portable 1V  Result Date: 10/07/2022 CLINICAL DATA:  Nausea and vomiting EXAM: PORTABLE ABDOMEN - 1 VIEW COMPARISON:  Abdominal CT from 2 days ago FINDINGS: The bowel gas pattern is normal. No  radio-opaque calculi or other significant radiographic abnormality are seen. IMPRESSION: Normal bowel gas pattern. Electronically Signed   By: Jorje Guild M.D.   On: 10/07/2022 06:36   VAS US DUPLEX DIALYSIS ACCESS (AVF, AVG)  Result Date: 10/06/2022 DIALYSIS ACCESS Patient Name:  [name]  Date of Exam:   10/06/2022 Medical Rec #: [id number]       Accession #:    [id number] Date of Birth: 1971-03-02      Patient Gender: F Patient Age:   17 years Exam [facility]  [facility] Procedure:      VAS US DUPLEX DIALYSIS ACCESS (AVF, AVG) Referring Phys: Lawson Radar --------------------------------------------------------------------------------  Reason for Exam: No palpable thrill for AVF/AVG. Access Site: Left Upper Extremity. Access Type: Brachio-basilic, status post first stage 10/01/22. Comparison Study: No prior study Performing Technologist: Sharion Dove RVS  Examination Guidelines: A complete evaluation includes B-mode imaging, spectral Doppler, color Doppler, and power Doppler as needed of all accessible portions of each vessel. Unilateral testing is considered an integral part of a complete examination. Limited examinations for reoccurring indications may be performed as noted.    Summary: Brachiobasilic AVF appears thrombosed  *See table(s) above for measurements and observations.  Diagnosing physician: Monica Martinez MD Electronically signed by Monica Martinez MD on 10/06/2022 at 1:02:35 PM.   --------------------------------------------------------------------------------   Final    ECHOCARDIOGRAM COMPLETE  Result Date: 10/05/2022    ECHOCARDIOGRAM REPORT   Patient Name:   [name] Date of Exam: 10/05/2022 Medical Rec #:  [id number]      Height:       72.0 in Accession #:    [id number]     Weight:       179.5 lb Date of Birth:  July 12, 1971     BSA:          2.035 m Patient Age:    35 years       BP:           120/72 mmHg Patient Gender: F              HR:           94 bpm. Exam  Location:  Inpatient Procedure: 2D Echo, Cardiac Doppler and Color Doppler Indications:    Chest Pain R07.9  History:        Patient has no prior history of Echocardiogram examinations.                 CHF, CAD and Previous Myocardial Infarction; Risk                 Factors:Diabetes and Hypertension.  Sonographer:    Luane School RDCS Referring Phys: [id number] [place]  1. Left ventricular ejection fraction, by estimation, is 40 to 45%. The left ventricle has mildly decreased function. The left ventricle demonstrates global hypokinesis. There is mild concentric left ventricular hypertrophy. Left ventricular diastolic parameters are consistent with Grade III diastolic dysfunction (restrictive). Elevated left atrial pressure.  2. Right ventricular systolic function is mildly reduced. The right ventricular size is moderately enlarged. There is moderately elevated pulmonary artery systolic pressure.  3. Left atrial size was moderately dilated.  4. Right atrial size was moderately dilated.  5. The mitral  valve is normal in structure. No evidence of mitral valve regurgitation. No evidence of mitral stenosis.  6. Tricuspid valve regurgitation is moderate to severe.  7. The aortic valve is tricuspid. Aortic valve regurgitation is not visualized. No aortic stenosis is present.  8. The inferior vena cava is dilated in size with <50% respiratory variability, suggesting right atrial pressure of 15 mmHg. Comparison(s): Prior images reviewed side by side. The left ventricular function is significantly worse. The right ventricular systolic function is worse. TR severity and PA hypertension are also worse, with evidence of hypervolemia. FINDINGS  Left Ventricle: Left ventricular ejection fraction, by estimation, is 40 to 45%. The left ventricle has mildly decreased function. The left ventricle demonstrates global hypokinesis. The left ventricular internal cavity size was normal in size. There is  mild concentric left  ventricular hypertrophy. Left ventricular diastolic parameters are consistent with Grade III diastolic dysfunction (restrictive). Elevated left atrial pressure. Right Ventricle: The right ventricular size is moderately enlarged. No increase in right ventricular wall thickness. Right ventricular systolic function is mildly reduced. There is moderately elevated pulmonary artery systolic pressure. The tricuspid regurgitant velocity is 3.05 m/s, and with an assumed right atrial pressure of 15 mmHg, the estimated right ventricular systolic pressure is [id number]. Left Atrium: Left atrial size was moderately dilated. Right Atrium: Right atrial size was moderately dilated. Pericardium: Trivial pericardial effusion is present. Mitral Valve: The mitral valve is normal in structure. There is mild thickening of the mitral valve leaflet(s). Mild mitral annular calcification. No evidence of mitral valve regurgitation. No evidence of mitral valve stenosis. Tricuspid Valve: The tricuspid valve is normal in structure. Tricuspid valve regurgitation is moderate to severe. Aortic Valve: The aortic valve is tricuspid. Aortic valve regurgitation is not visualized. No aortic stenosis is present. Pulmonic Valve: The pulmonic valve was normal in structure. Pulmonic valve regurgitation is trivial. Aorta: The aortic root and ascending aorta are structurally normal, with no evidence of dilitation. Venous: The inferior vena cava is dilated in size with less than 50% respiratory variability, suggesting right atrial pressure of 15 mmHg. IAS/Shunts: No atrial level shunt detected by color flow Doppler. Additional Comments: A device lead is visualized in the right ventricle. There is pleural effusion in the left lateral region.  LEFT VENTRICLE PLAX 2D LVIDd:         4.70 cm     Diastology LVIDs:         3.80 cm     LV e' medial:    6.53 cm/s LV PW:         1.30 cm     LV E/e' medial:  16.1 LV IVS:        1.14 cm     LV e' lateral:   8.05 cm/s LVOT  diam:     2.00 cm     LV E/e' lateral: 13.0 LV SV:         43 LV SV Index:   21 LVOT Area:     3.14 cm  LV Volumes (MOD) LV vol d, MOD A2C: 85.9 ml LV vol d, MOD A4C: 78.0 ml LV vol s, MOD A2C: 54.3 ml LV vol s, MOD A4C: 53.8 ml LV SV MOD A2C:     31.6 ml LV SV MOD A4C:     78.0 ml LV SV MOD BP:      31.4 ml RIGHT VENTRICLE            IVC RV S prime:  9.90 cm/s  IVC diam: 2.40 cm TAPSE (M-mode): 1.7 cm LEFT ATRIUM           Index        RIGHT ATRIUM           Index LA diam:      4.67 cm 2.30 cm/m   RA Area:     19.40 cm LA Vol (A2C): 82.5 ml 40.55 ml/m  RA Volume:   58.20 ml  28.60 ml/m LA Vol (A4C): 66.5 ml 32.68 ml/m  AORTIC VALVE LVOT Vmax:   77.50 cm/s LVOT Vmean:  58.000 cm/s LVOT VTI:    0.138 m  AORTA Ao Root diam: 3.40 cm Ao Asc diam:  3.30 cm Ao Desc diam: 2.40 cm MITRAL VALVE                TRICUSPID VALVE MV Area (PHT): 6.96 cm     TR Peak grad:   37.2 mmHg MV Decel Time: 109 msec     TR Vmax:        305.00 cm/s MV E velocity: 105.00 cm/s MV A velocity: 41.60 cm/s   SHUNTS MV E/A ratio:  2.52         Systemic VTI:  0.14 m                             Systemic Diam: 2.00 cm Dani Gobble Croitoru MD Electronically signed by Sanda Klein MD Signature Date/Time: 10/05/2022/2:53:24 PM    Final    CT ABDOMEN PELVIS WO CONTRAST  Result Date: 10/05/2022 CLINICAL DATA:  Abdominal pain, vomiting EXAM: CT ABDOMEN AND PELVIS WITHOUT CONTRAST TECHNIQUE: Multidetector CT imaging of the abdomen and pelvis was performed following the standard protocol without IV contrast. RADIATION DOSE REDUCTION: This exam was performed according to the departmental dose-optimization program which includes automated exposure control, adjustment of the mA and/or kV according to patient size and/or use of iterative reconstruction technique. COMPARISON:  05/23/2022 FINDINGS: Lower chest: Moderate bilateral pleural effusions with associated compressive atelectasis in the bilateral lower lobes. Additional patchy anterior right lower  lobe opacity, possibly reflecting pneumonia, less likely atelectasis. Mild lingular and left lower lobe atelectasis. Hepatobiliary: Unenhanced liver is unremarkable. Gallbladder is unremarkable. No intrahepatic or extrahepatic duct dilatation. Pancreas: Within normal limits. Spleen: Within normal limits. Adrenals/Urinary Tract: Adrenal glands are within normal limits. Kidneys are within normal limits. No renal, ureteral, or bladder calculi. No hydronephrosis. Mildly thick-walled bladder. Stomach/Bowel: Stomach is within normal limits. No evidence of bowel obstruction. Normal appendix (series 3/image 66). No colonic wall thickening or inflammatory changes. Vascular/Lymphatic: No evidence of abdominal aortic aneurysm. No suspicious abdominopelvic lymphadenopathy. Reproductive: Uterus is within normal limits. Bilateral ovaries are within normal limits. Other: Trace pelvic ascites. Musculoskeletal: Visualized osseous structures are within normal limits. IMPRESSION: No evidence of bowel obstruction. Normal appendix. No CT findings to account for the patient's abdominal pain. Mildly thick-walled bladder, correlate for cystitis. Moderate bilateral pleural effusions with associated compressive atelectasis in the bilateral lower lobes. Additional patchy anterior right lower lobe opacity, possibly reflecting pneumonia, less likely atelectasis. Electronically Signed   By: Julian Hy M.D.   On: 10/05/2022 01:13      Signature  -   Phillips Climes M.D on 10/12/2022 at 12:50 PM   -  To page go to [email or web address]

## 2022-10-12 NOTE — Progress Notes (Signed)
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 Recent Left AVF placed by Dr. Donnetta Hutching.  Recent progressive CKD in the setting of multiple hospitalization, ongoing gastroparesis flare etc.  She is on antibiotics for pneumonia.   - having some uremic symptoms- nausea and shakiness - started dialysis 10/07/22 - TDC placed by IR, appreciate assistance - HD #1 3/4 after [facility] and also Hep B - appreciate VVS eval of thrombosed AVF - HD #2 10/09/22, HD #3 10/11/22 - CLIP'd   # Possible pneumonia/acute hypoxic respiratory failure/RSV+: s/p ceftriaxone and doxy. currently on oxygen.    # Acute on chronic diastolic CHF with fluid overload/pleural effusion: Echo with EF of 40 to 45%, mild concentric LVH.   - s/p IV diuretics, HD  # Metabolic acidosis: HD will correct thsi   # CKD-MBD: Corrected calcium and phosphorus level acceptable for CKD.   # Anemia of CKD: - started iron load 3/4 - reorder ESA - since IV looks like it's not working- we can give blood in HD   # Hypertension/volume: Currently on cardiac medication including metoprolol, Imdur, hydralazine.  Diuretics as above.  Blood pressure acceptable.  #fevers: - blood cultures 3/5 - timing appears c/w febrile transfusion reaction- but she has a new TDC too - got one dose of vanc - PCT up to 8.13--> but can be elevated after febrile reactions - closely monitor on dialysis and the new catheter - ID following- stopped vanc 3/6    Subjective:     Feeling better, still sleepy this AM but overall improved   Objective:   BP (!) 122/55 (BP Location: Left Leg)   Pulse 80   Temp 98.3 F (36.8 C) (Oral)   Resp (!) 24   Ht 6' (1.829 m)   Wt 81.7 kg   LMP 11/17/2021 (Approximate)   SpO2 97%   BMI 24.43 kg/m   Physical Exam: [id number], easily arousable CVS: RRR Resp: clear Abd: soft Ext: no  edema ACCESS: L AVF thrombosed, R IJ TDC   Labs: BMET Recent Labs  Lab 10/06/22 0730 10/07/22 0256 10/08/22 0606 10/09/22 0602 10/10/22 0543 10/11/22 0818 10/12/22 0329  NA 137 133* 134* 134* 136 137 137  K 4.3 3.8 3.7 3.5 3.5 3.3* 3.1*  CL 106 104 100 97* 100 100 99  CO2 16* 21* 19* '23 23 26 28  '$ GLUCOSE 227* 133* 147* 123* 249* 282* 130*  BUN 48* 57* 46* 62* 42* 66* 35*  CREATININE 4.73* 5.31* 4.62* 6.21* 4.51* 5.26* 3.63*  CALCIUM 8.0* 7.8* 7.5* 7.7* 7.7* 7.6* 7.4*  PHOS 5.4* 5.9*  --   --   --   --   --    CBC Recent Labs  Lab 10/09/22 0602 10/10/22 0543 10/11/22 0818 10/12/22 0329  WBC 5.5 5.9 8.5 9.9  NEUTROABS 3.9 5.0 7.6 6.5  HGB 6.4* 8.5* 7.9* 8.2*  HCT 20.5* 25.9* 24.5* 24.7*  MCV 88.7 87.2 89.7 87.6  PLT 181 202 240 229      Medications:     amLODipine  10 mg Oral Daily   aspirin EC  81 mg Oral Daily   Chlorhexidine Gluconate Cloth  6 each Topical Q0600   darbepoetin (ARANESP) injection - DIALYSIS  100 mcg Subcutaneous Q Thu-1800   feeding supplement  237 mL Oral BID WC   hydrALAZINE  75 mg Oral Q8H   insulin aspart  0-5 Units Subcutaneous QHS   insulin aspart  0-9 Units Subcutaneous TID WC   insulin glargine-yfgn  10 Units Subcutaneous QHS   isosorbide dinitrate  20 mg Oral TID   metoprolol succinate  50 mg Oral Daily   multivitamin  1 tablet Oral QHS   pantoprazole  40 mg Oral BID AC   pravastatin  40 mg Oral q1800   scopolamine  1 patch Transdermal Q72H     Madelon Lips MD 10/12/2022, 7:20 AM

## 2022-10-12 NOTE — Progress Notes (Signed)
PAtient refusing all PO meds at this time related to nausea. Dr Waldron Labs aware.

## 2022-10-13 DIAGNOSIS — N186 End stage renal disease: Secondary | ICD-10-CM | POA: Diagnosis not present

## 2022-10-13 DIAGNOSIS — K3184 Gastroparesis: Secondary | ICD-10-CM | POA: Diagnosis not present

## 2022-10-13 LAB — GLUCOSE, CAPILLARY
Glucose-Capillary: 149 mg/dL — ABNORMAL HIGH (ref 70–99)
Glucose-Capillary: 159 mg/dL — ABNORMAL HIGH (ref 70–99)
Glucose-Capillary: 192 mg/dL — ABNORMAL HIGH (ref 70–99)
Glucose-Capillary: 220 mg/dL — ABNORMAL HIGH (ref 70–99)

## 2022-10-13 LAB — CULTURE, BLOOD (ROUTINE X 2)
Culture: NO GROWTH
Culture: NO GROWTH
Special Requests: ADEQUATE
Special Requests: ADEQUATE

## 2022-10-13 NOTE — Progress Notes (Signed)
IV team called to bedside for reevaluation of IV site. IV flushed per 2 RNs with ease; pt tolerates well. Pt advised to inform RN for redness, pain, swelling at site.

## 2022-10-13 NOTE — Progress Notes (Signed)
PROGRESS NOTE                                                                                                                                                                                                             Patient Demographics:    [name], is a 52 y.o. female, DOB - [birth year], [id number]  Outpatient Primary MD for the patient is [name], [place]    LOS - 9  Admit date - 10/04/2022    Chief Complaint  Patient presents with   Abdominal Pain   AICD Problem       Brief Narrative (HPI from H&P)     50/F with history of type 1 diabetes mellitus, diabetic gastroparesis, recurrent admissions for nausea and vomiting, CKD 4 now progressing to stage V, cardiac arrest 04/05/2022 (witnessed cardiac arrest at a restaurant, able AED advised shock, echo with preserved EF, mildly elevated PASP, underwent single-chamber ICD on 04/09/22, since then has been admitted multiple times with nausea and vomiting due to diabetic gastroparesis presented this time with abdominal pain nausea vomiting workup suggestive of RSV pneumonitis likely causing exacerbation of diabetic gastroparesis due to viral infection, was admitted to the hospital for further care .   Subjective:   Patient reports vomiting 2 yesterday evening, nausea still present but much improved, no vomiting today, report she is feeling better.   Assessment  & Plan :    Diabetic gastroparesis acute on chronic exacerbation due to RSV viral infection causing recurrent and persistent nausea vomiting.   -Continue supportive care, abdominal exam is benign. - abdominal exam KUB stable. -Uremia from renal failure may be contributing, nausea has improved today after starting dialysis -GI signed off -She is with significant nausea and vomiting, started on IV erythromycin, appears to be improving, she does report some nausea today, but overall she is feeling better . -  she has allergy to Reglan.  Fevers starting 10/07/2022.   -Follow-up blood cultures, so far no growth to date. -Felt to be secondary to transfusion reaction, discussed with Dr. Alvy Bimler , she recommends Benadryl and will before transfusion, as well can add steroids if needed.  Chest x-ray and recent UA unremarkable. -So far remains afebrile   AKI on CKD 5.  -started on HD -Management per renal, she is clipped.  RSV viral pneumonitis.  Currently not hypoxic.  No acute issues, this has clinically resolved.  Acute on chronic anemia.   - Does have underlying anemia of chronic disease with acute drop due to multiple blood draws and acute illness, also could have some mild upper GI bleed due to persistent nausea vomiting causing Mallory-Weiss tear, IV PPI, hold heparin and aspirin for few days, she was ordered 1 unit of packed RBC transfusion on 10/07/2022 but had a mild febrile transfusion reaction, transfusion labs checked which are stable, case discussed with pathologist on call on 10/08/2022.  All changes consistent with mild febrile transfusion reaction. -Received 1 unit PRBC 3/6, hemoglobin remained stable, continue to monitor -Procrit and IV iron per renal  Chronic combined diastolic and systolic heart failure history of V. tach with cardiac arrest requiring AICD placement.  EF 40 to 45%. Cardiology is following, currently compensated, continue combination of beta-blocker, Isordil and hydralazine, no ACE/ARB due to underlying renal insufficiency, mild troponin bump in non-ACS pattern due to underlying CHF and stress of #1 and 2 above, continue monitoring with supportive care.  Hypertension.  Acceptable on metoprolol, Isordil, hydralazine,  and amlodipine.  Left arm AV fistula.  Clotted off.  VVS following.  Dyslipidemia.  On statin.    DM type II.  On sliding scale and Lantus.  Monitor.  Lab Results  Component Value Date   HGBA1C 7.5 (H) 10/05/2022   CBG (last 3)  Recent Labs     10/12/22 2052 10/13/22 0754 10/13/22 1135  GLUCAP 223* 149* 159*         Condition -  Guarded  Family Communication  :  None  Code Status :  Full  Consults  :  VVS, Renal, Cards, GI, ID  PUD Prophylaxis : PPI   Procedures  :     TTE -  1. Left ventricular ejection fraction, by estimation, is 40 to 45%. The left ventricle has mildly decreased function. The left ventricle demonstrates global hypokinesis. There is mild concentric left ventricular hypertrophy. Left ventricular diastolic parameters are consistent with Grade III diastolic dysfunction (restrictive). Elevated left atrial pressure.  2. Right ventricular systolic function is mildly reduced. The right ventricular size is moderately enlarged. There is moderately elevated pulmonary artery systolic pressure.  3. Left atrial size was moderately dilated.  4. Right atrial size was moderately dilated.  5. The mitral valve is normal in structure. No evidence of mitral valve regurgitation. No evidence of mitral stenosis.  6. Tricuspid valve regurgitation is moderate to severe.  7. The aortic valve is tricuspid. Aortic valve regurgitation is not visualized. No aortic stenosis is present.  8. The inferior vena cava is dilated in size with <50% respiratory variability, suggesting right atrial pressure of 15 mmHg. Comparison(s): Prior images reviewed side by side. The left ventricular function is significantly worse. The right ventricular systolic function is worse  L Arm [place] -  Brachiobasilic AVF appears thrombosed  CT -  No evidence of bowel obstruction. Normal appendix. No CT findings to account for the patient's abdominal pain. Mildly thick-walled bladder, correlate for cystitis. Moderate bilateral pleural effusions with associated compressive atelectasis in the bilateral lower lobes. Additional patchy anterior right lower lobe opacity, possibly reflecting pneumonia, less likely atelectasis.      Disposition Plan  :    Status is:  Inpatient   DVT Prophylaxis  :    Place and maintain sequential compression device Start: 10/07/22 0554  Lab Results  Component Value Date   PLT 229 10/12/2022    Diet :  Diet Order             Diet renal/carb modified with fluid restriction Diet-HS Snack? Nothing; Room service appropriate? Yes; Fluid consistency: Thin  Diet effective now                    Inpatient Medications  Scheduled Meds:  amLODipine  10 mg Oral Daily   aspirin EC  81 mg Oral Daily   Chlorhexidine Gluconate Cloth  6 each Topical Q0600   darbepoetin (ARANESP) injection - DIALYSIS  100 mcg Subcutaneous Q Thu-1800   feeding supplement  237 mL Oral BID WC   hydrALAZINE  75 mg Oral Q8H   insulin aspart  0-5 Units Subcutaneous QHS   insulin aspart  0-9 Units Subcutaneous TID WC   insulin glargine-yfgn  10 Units Subcutaneous QHS   isosorbide dinitrate  20 mg Oral TID   metoprolol succinate  50 mg Oral Daily   multivitamin  1 tablet Oral QHS   pantoprazole  40 mg Oral BID AC   pravastatin  40 mg Oral q1800   scopolamine  1 patch Transdermal Q72H   Continuous Infusions:  erythromycin 500 mg (10/13/22 1242)   ferric gluconate (FERRLECIT) IVPB Stopped (10/11/22 1015)   PRN Meds:.acetaminophen, albuterol, Gerhardt's butt cream, hydrALAZINE, hydrOXYzine, LORazepam, nitroGLYCERIN, oxyCODONE-acetaminophen, prochlorperazine    Objective:   Vitals:   10/12/22 1646 10/12/22 2022 10/13/22 0555 10/13/22 0800  BP:  (!) 166/64 (!) 159/58 (!) 159/68  Pulse: 87 84 80   Resp: '20 20 20 '$ (!) 24  Temp: 98.8 F (37.1 C) 98.2 F (36.8 C) 98.4 F (36.9 C)   TempSrc: Oral Oral Oral   SpO2: 95% 97% 95%   Weight:      Height:        Wt Readings from Last 3 Encounters:  10/11/22 81.7 kg  10/01/22 80.3 kg  09/13/22 80.3 kg     Intake/Output Summary (Last 24 hours) at 10/13/2022 1307 Last data filed at 10/13/2022 1034 Gross per 24 hour  Intake 316.6 ml  Output --  Net 316.6 ml     Physical  Exam  Awake Alert, Oriented X 3, No new F.N deficits, Normal affect Symmetrical Chest wall movement, Good air movement bilaterally, CTAB RRR,No Gallops,Rubs or new Murmurs, No Parasternal Heave +ve B.Sounds, Abd Soft, No tenderness, No rebound - guarding or rigidity. No Cyanosis, Clubbing or edema, No new Rash or bruise        Data Review:    Recent Labs  Lab 10/08/22 1627 10/09/22 0602 10/10/22 0543 10/11/22 0818 10/12/22 0329  WBC 6.1 5.5 5.9 8.5 9.9  HGB 7.0* 6.4* 8.5* 7.9* 8.2*  HCT 20.7* 20.5* 25.9* 24.5* 24.7*  PLT 195 181 202 240 229  MCV 87.0 88.7 87.2 89.7 87.6  MCH 29.4 27.7 28.6 28.9 29.1  MCHC 33.8 31.2 32.8 32.2 33.2  RDW 16.0* 15.9* 15.3 15.7* 15.3  LYMPHSABS 1.0 1.2 0.6* 0.7 2.3  MONOABS 0.2 0.2 0.1 0.1 0.4  EOSABS 0.0 0.1 0.2 0.2 0.3  BASOSABS 0.0 0.0 0.0 0.0 0.0    Recent Labs  Lab 10/07/22 0256 10/08/22 0606 10/08/22 1055 10/09/22 0602 10/10/22 0543 10/11/22 0818 10/12/22 0329  NA 133* 134*  --  134* 136 137 137  K 3.8 3.7  --  3.5 3.5 3.3* 3.1*  CL 104 100  --  97* 100 100 99  CO2 21* 19*  --  '23 23 26 28  '$ ANIONGAP 8 15  --  14  $'13 11 10  'M$ GLUCOSE 133* 147*  --  123* 249* 282* 130*  BUN 57* 46*  --  62* 42* 66* 35*  CREATININE 5.31* 4.62*  --  6.21* 4.51* 5.26* 3.63*  AST 125* 59*  --  68* 35 23 23  ALT 96* 84*  --  84* 67* 46* 36  ALKPHOS 190* 149*  --  234* 216* 167* 169*  BILITOT 0.6 0.8  --  0.8 0.9 0.6 0.6  ALBUMIN 2.3* 2.3*  --  2.1* 2.3* 2.1* 2.1*  CRP 8.8*  --   --   --   --   --   --   PROCALCITON  --   --  8.13  --   --   --   --   BNP  --  2,182.7*  --  1,719.6*  --   --   --   MG 1.8 1.6*  --  1.8 1.8 1.7  --   CALCIUM 7.8* 7.5*  --  7.7* 7.7* 7.6* 7.4*      Recent Labs  Lab 10/07/22 0256 10/08/22 0606 10/08/22 1055 10/09/22 0602 10/10/22 0543 10/11/22 0818 10/12/22 0329  CRP 8.8*  --   --   --   --   --   --   PROCALCITON  --   --  8.13  --   --   --   --   BNP  --  2,182.7*  --  1,719.6*  --   --   --   MG 1.8  1.6*  --  1.8 1.8 1.7  --   CALCIUM 7.8* 7.5*  --  7.7* 7.7* 7.6* 7.4*    Recent Labs  Lab 10/07/22 0256 10/07/22 0754 10/08/22 0606 10/08/22 1055 10/08/22 1627 10/09/22 0602 10/10/22 0543 10/11/22 0818 10/12/22 0329  WBC 5.7   < > 6.7  --  6.1 5.5 5.9 8.5 9.9  PLT 208   < > 202  --  195 181 202 240 229  CRP 8.8*  --   --   --   --   --   --   --   --   PROCALCITON  --   --   --  8.13  --   --   --   --   --   CREATININE 5.31*  --  4.62*  --   --  6.21* 4.51* 5.26* 3.63*   < > = values in this interval not displayed.    ------------------------------------------------------------------------------------------------------------------ No results for input(s): "CHOL", "HDL", "LDLCALC", "TRIG", "CHOLHDL", "LDLDIRECT" in the last 72 hours.  Lab Results  Component Value Date   HGBA1C 7.5 (H) 10/05/2022   Radiology Reports DG Chest Port 1 View  Result Date: 10/07/2022 CLINICAL DATA:  Shortness of breath EXAM: PORTABLE CHEST 1 VIEW COMPARISON:  Three days ago FINDINGS: Cardiomegaly and vascular pedicle widening with diffuse interstitial opacity. No air bronchogram. No pneumothorax. ICD lead into the right ventricle. Artifact from EKG leads. IMPRESSION: Suspect mild CHF. Electronically Signed   By: Jorje Guild M.D.   On: 10/07/2022 06:37   DG Abd Portable 1V  Result Date: 10/07/2022 CLINICAL DATA:  Nausea and vomiting EXAM: PORTABLE ABDOMEN - 1 VIEW COMPARISON:  Abdominal CT from 2 days ago FINDINGS: The bowel gas pattern is normal. No radio-opaque calculi or other significant radiographic abnormality are seen. IMPRESSION: Normal bowel gas pattern. Electronically Signed   By: Jorje Guild M.D.   On: 10/07/2022 06:36   VAS US DUPLEX DIALYSIS  ACCESS (AVF, AVG)  Result Date: 10/06/2022 DIALYSIS ACCESS Patient Name:  [name]  Date of Exam:   10/06/2022 Medical Rec #: [id number]       Accession #:    [id number] Date of Birth: 09-04-1970      Patient Gender: F Patient Age:   60  years Exam Location:  [facility] Procedure:      VAS US DUPLEX DIALYSIS ACCESS (AVF, AVG) Referring Phys: Lawson Radar --------------------------------------------------------------------------------  Reason for Exam: No palpable thrill for AVF/AVG. Access Site: Left Upper Extremity. Access Type: Brachio-basilic, status post first stage 10/01/22. Comparison Study: No prior study Performing Technologist: Sharion Dove RVS  Examination Guidelines: A complete evaluation includes B-mode imaging, spectral Doppler, color Doppler, and power Doppler as needed of all accessible portions of each vessel. Unilateral testing is considered an integral part of a complete examination. Limited examinations for reoccurring indications may be performed as noted.    Summary: Brachiobasilic AVF appears thrombosed  *See table(s) above for measurements and observations.  Diagnosing physician: Monica Martinez MD Electronically signed by Monica Martinez MD on 10/06/2022 at 1:02:35 PM.   --------------------------------------------------------------------------------   Final    ECHOCARDIOGRAM COMPLETE  Result Date: 10/05/2022    ECHOCARDIOGRAM REPORT   Patient Name:   [name] Date of Exam: 10/05/2022 Medical Rec #:  [id number]      Height:       72.0 in Accession #:    [id number]     Weight:       179.5 lb Date of Birth:  1971-04-22     BSA:          2.035 m Patient Age:    6 years       BP:           120/72 mmHg Patient Gender: F              HR:           94 bpm. Exam Location:  Inpatient Procedure: 2D Echo, Cardiac Doppler and Color Doppler Indications:    Chest Pain R07.9  History:        Patient has no prior history of Echocardiogram examinations.                 CHF, CAD and Previous Myocardial Infarction; Risk                 Factors:Diabetes and Hypertension.  Sonographer:    Luane School RDCS Referring Phys: [id number] [place]  1. Left ventricular ejection fraction, by estimation, is 40 to 45%. The  left ventricle has mildly decreased function. The left ventricle demonstrates global hypokinesis. There is mild concentric left ventricular hypertrophy. Left ventricular diastolic parameters are consistent with Grade III diastolic dysfunction (restrictive). Elevated left atrial pressure.  2. Right ventricular systolic function is mildly reduced. The right ventricular size is moderately enlarged. There is moderately elevated pulmonary artery systolic pressure.  3. Left atrial size was moderately dilated.  4. Right atrial size was moderately dilated.  5. The mitral valve is normal in structure. No evidence of mitral valve regurgitation. No evidence of mitral stenosis.  6. Tricuspid valve regurgitation is moderate to severe.  7. The aortic valve is tricuspid. Aortic valve regurgitation is not visualized. No aortic stenosis is present.  8. The inferior vena cava is dilated in size with <50% respiratory variability, suggesting right atrial pressure of 15 mmHg. Comparison(s): Prior images reviewed side by side. The left ventricular function is significantly worse.  The right ventricular systolic function is worse. TR severity and PA hypertension are also worse, with evidence of hypervolemia. FINDINGS  Left Ventricle: Left ventricular ejection fraction, by estimation, is 40 to 45%. The left ventricle has mildly decreased function. The left ventricle demonstrates global hypokinesis. The left ventricular internal cavity size was normal in size. There is  mild concentric left ventricular hypertrophy. Left ventricular diastolic parameters are consistent with Grade III diastolic dysfunction (restrictive). Elevated left atrial pressure. Right Ventricle: The right ventricular size is moderately enlarged. No increase in right ventricular wall thickness. Right ventricular systolic function is mildly reduced. There is moderately elevated pulmonary artery systolic pressure. The tricuspid regurgitant velocity is 3.05 m/s, and with an  assumed right atrial pressure of 15 mmHg, the estimated right ventricular systolic pressure is [id number]. Left Atrium: Left atrial size was moderately dilated. Right Atrium: Right atrial size was moderately dilated. Pericardium: Trivial pericardial effusion is present. Mitral Valve: The mitral valve is normal in structure. There is mild thickening of the mitral valve leaflet(s). Mild mitral annular calcification. No evidence of mitral valve regurgitation. No evidence of mitral valve stenosis. Tricuspid Valve: The tricuspid valve is normal in structure. Tricuspid valve regurgitation is moderate to severe. Aortic Valve: The aortic valve is tricuspid. Aortic valve regurgitation is not visualized. No aortic stenosis is present. Pulmonic Valve: The pulmonic valve was normal in structure. Pulmonic valve regurgitation is trivial. Aorta: The aortic root and ascending aorta are structurally normal, with no evidence of dilitation. Venous: The inferior vena cava is dilated in size with less than 50% respiratory variability, suggesting right atrial pressure of 15 mmHg. IAS/Shunts: No atrial level shunt detected by color flow Doppler. Additional Comments: A device lead is visualized in the right ventricle. There is pleural effusion in the left lateral region.  LEFT VENTRICLE PLAX 2D LVIDd:         4.70 cm     Diastology LVIDs:         3.80 cm     LV e' medial:    6.53 cm/s LV PW:         1.30 cm     LV E/e' medial:  16.1 LV IVS:        1.14 cm     LV e' lateral:   8.05 cm/s LVOT diam:     2.00 cm     LV E/e' lateral: 13.0 LV SV:         43 LV SV Index:   21 LVOT Area:     3.14 cm  LV Volumes (MOD) LV vol d, MOD A2C: 85.9 ml LV vol d, MOD A4C: 78.0 ml LV vol s, MOD A2C: 54.3 ml LV vol s, MOD A4C: 53.8 ml LV SV MOD A2C:     31.6 ml LV SV MOD A4C:     78.0 ml LV SV MOD BP:      31.4 ml RIGHT VENTRICLE            IVC RV S prime:     9.90 cm/s  IVC diam: 2.40 cm TAPSE (M-mode): 1.7 cm LEFT ATRIUM           Index        RIGHT ATRIUM            Index LA diam:      4.67 cm 2.30 cm/m   RA Area:     19.40 cm LA Vol (A2C): 82.5 ml 40.55 ml/m  RA Volume:   58.20  ml  28.60 ml/m LA Vol (A4C): 66.5 ml 32.68 ml/m  AORTIC VALVE LVOT Vmax:   77.50 cm/s LVOT Vmean:  58.000 cm/s LVOT VTI:    0.138 m  AORTA Ao Root diam: 3.40 cm Ao Asc diam:  3.30 cm Ao Desc diam: 2.40 cm MITRAL VALVE                TRICUSPID VALVE MV Area (PHT): 6.96 cm     TR Peak grad:   37.2 mmHg MV Decel Time: 109 msec     TR Vmax:        305.00 cm/s MV E velocity: 105.00 cm/s MV A velocity: 41.60 cm/s   SHUNTS MV E/A ratio:  2.52         Systemic VTI:  0.14 m                             Systemic Diam: 2.00 cm Dani Gobble Croitoru MD Electronically signed by Sanda Klein MD Signature Date/Time: 10/05/2022/2:53:24 PM    Final    CT ABDOMEN PELVIS WO CONTRAST  Result Date: 10/05/2022 CLINICAL DATA:  Abdominal pain, vomiting EXAM: CT ABDOMEN AND PELVIS WITHOUT CONTRAST TECHNIQUE: Multidetector CT imaging of the abdomen and pelvis was performed following the standard protocol without IV contrast. RADIATION DOSE REDUCTION: This exam was performed according to the departmental dose-optimization program which includes automated exposure control, adjustment of the mA and/or kV according to patient size and/or use of iterative reconstruction technique. COMPARISON:  05/23/2022 FINDINGS: Lower chest: Moderate bilateral pleural effusions with associated compressive atelectasis in the bilateral lower lobes. Additional patchy anterior right lower lobe opacity, possibly reflecting pneumonia, less likely atelectasis. Mild lingular and left lower lobe atelectasis. Hepatobiliary: Unenhanced liver is unremarkable. Gallbladder is unremarkable. No intrahepatic or extrahepatic duct dilatation. Pancreas: Within normal limits. Spleen: Within normal limits. Adrenals/Urinary Tract: Adrenal glands are within normal limits. Kidneys are within normal limits. No renal, ureteral, or bladder calculi. No  hydronephrosis. Mildly thick-walled bladder. Stomach/Bowel: Stomach is within normal limits. No evidence of bowel obstruction. Normal appendix (series 3/image 66). No colonic wall thickening or inflammatory changes. Vascular/Lymphatic: No evidence of abdominal aortic aneurysm. No suspicious abdominopelvic lymphadenopathy. Reproductive: Uterus is within normal limits. Bilateral ovaries are within normal limits. Other: Trace pelvic ascites. Musculoskeletal: Visualized osseous structures are within normal limits. IMPRESSION: No evidence of bowel obstruction. Normal appendix. No CT findings to account for the patient's abdominal pain. Mildly thick-walled bladder, correlate for cystitis. Moderate bilateral pleural effusions with associated compressive atelectasis in the bilateral lower lobes. Additional patchy anterior right lower lobe opacity, possibly reflecting pneumonia, less likely atelectasis. Electronically Signed   By: Julian Hy M.D.   On: 10/05/2022 01:13      Signature  -   Phillips Climes M.D on 10/13/2022 at 1:07 PM   -  To page go to [email or web address]

## 2022-10-13 NOTE — Progress Notes (Signed)
Dr. Johnette Abraham made aware that the patient is still not taking any PO meds. Also made aware that she has not been able to receive any erythromycin today due to not having IV access. Waiting on IV team to get an IV for the patient.

## 2022-10-13 NOTE — Progress Notes (Signed)
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 Recent Left AVF placed by Dr. Donnetta Hutching.  Recent progressive CKD in the setting of multiple hospitalization, ongoing gastroparesis flare etc.  She is on antibiotics for pneumonia.   - having some uremic symptoms- nausea and shakiness - started dialysis 10/07/22 - TDC placed by IR, appreciate assistance - HD #1 3/4 after [facility] and also Hep B - appreciate VVS eval of thrombosed AVF - HD #2 10/09/22, HD #3 10/11/22 - CLIP'd - next HD Monday, increase UF goal to get off O2   # Possible pneumonia/acute hypoxic respiratory failure/RSV+: s/p ceftriaxone and doxy. currently on oxygen.    # Acute on chronic diastolic CHF with fluid overload/pleural effusion: Echo with EF of 40 to 45%, mild concentric LVH.   - s/p IV diuretics, HD    # CKD-MBD: Corrected calcium and phosphorus level acceptable for CKD.   # Anemia of CKD: - started iron load 3/4 - reorded ESA - s/p 1 u pRBCs 10/09/22   # Hypertension/volume: Currently on cardiac medication including metoprolol, Imdur, hydralazine.  Diuretics as above.  Blood pressure acceptable.  #fevers: - blood cultures 3/5 - timing appears c/w febrile transfusion reaction- but she has a new TDC too - got one dose of vanc - PCT up to 8.13--> but can be elevated after febrile reactions - closely monitor on dialysis and the new catheter - ID following- stopped vanc 3/6    Subjective:    Still a little nauseated but overall better.   Objective:   BP (!) 159/68   Pulse 80   Temp 98.4 F (36.9 C) (Oral)   Resp (!) 24   Ht 6' (1.829 m)   Wt 81.7 kg   LMP 11/17/2021 (Approximate)   SpO2 95%   BMI 24.43 kg/m   Physical Exam: [id number], easily arousable, looks a little uncomfortable CVS: RRR Resp: clear, muffled in bases, wearing O2 Abd: soft Ext: no edema ACCESS:  L AVF thrombosed, R IJ TDC   Labs: BMET Recent Labs  Lab 10/07/22 0256 10/08/22 0606 10/09/22 0602 10/10/22 0543 10/11/22 0818 10/12/22 0329  NA 133* 134* 134* 136 137 137  K 3.8 3.7 3.5 3.5 3.3* 3.1*  CL 104 100 97* 100 100 99  CO2 21* 19* '23 23 26 28  '$ GLUCOSE 133* 147* 123* 249* 282* 130*  BUN 57* 46* 62* 42* 66* 35*  CREATININE 5.31* 4.62* 6.21* 4.51* 5.26* 3.63*  CALCIUM 7.8* 7.5* 7.7* 7.7* 7.6* 7.4*  PHOS 5.9*  --   --   --   --   --    CBC Recent Labs  Lab 10/09/22 0602 10/10/22 0543 10/11/22 0818 10/12/22 0329  WBC 5.5 5.9 8.5 9.9  NEUTROABS 3.9 5.0 7.6 6.5  HGB 6.4* 8.5* 7.9* 8.2*  HCT 20.5* 25.9* 24.5* 24.7*  MCV 88.7 87.2 89.7 87.6  PLT 181 202 240 229      Medications:     amLODipine  10 mg Oral Daily   aspirin EC  81 mg Oral Daily   Chlorhexidine Gluconate Cloth  6 each Topical Q0600   darbepoetin (ARANESP) injection - DIALYSIS  100 mcg Subcutaneous Q Thu-1800   feeding supplement  237 mL Oral BID WC   hydrALAZINE  75 mg Oral Q8H   insulin aspart  0-5 Units Subcutaneous QHS   insulin aspart  0-9 Units Subcutaneous  TID WC   insulin glargine-yfgn  10 Units Subcutaneous QHS   isosorbide dinitrate  20 mg Oral TID   metoprolol succinate  50 mg Oral Daily   multivitamin  1 tablet Oral QHS   pantoprazole  40 mg Oral BID AC   pravastatin  40 mg Oral q1800   scopolamine  1 patch Transdermal Q72H     Madelon Lips MD 10/13/2022, 9:58 AM

## 2022-10-14 DIAGNOSIS — K920 Hematemesis: Secondary | ICD-10-CM | POA: Diagnosis not present

## 2022-10-14 LAB — CBC
HCT: 26.2 % — ABNORMAL LOW (ref 36.0–46.0)
Hemoglobin: 8.4 g/dL — ABNORMAL LOW (ref 12.0–15.0)
MCH: 28.7 pg (ref 26.0–34.0)
MCHC: 32.1 g/dL (ref 30.0–36.0)
MCV: 89.4 fL (ref 80.0–100.0)
Platelets: 340 10*3/uL (ref 150–400)
RBC: 2.93 MIL/uL — ABNORMAL LOW (ref 3.87–5.11)
RDW: 15.9 % — ABNORMAL HIGH (ref 11.5–15.5)
WBC: 9.7 10*3/uL (ref 4.0–10.5)
nRBC: 0 % (ref 0.0–0.2)

## 2022-10-14 LAB — RENAL FUNCTION PANEL
Albumin: 2.2 g/dL — ABNORMAL LOW (ref 3.5–5.0)
Anion gap: 12 (ref 5–15)
BUN: 56 mg/dL — ABNORMAL HIGH (ref 6–20)
CO2: 26 mmol/L (ref 22–32)
Calcium: 8.1 mg/dL — ABNORMAL LOW (ref 8.9–10.3)
Chloride: 100 mmol/L (ref 98–111)
Creatinine, Ser: 5.21 mg/dL — ABNORMAL HIGH (ref 0.44–1.00)
GFR, Estimated: 9 mL/min — ABNORMAL LOW (ref >60)
Glucose, Bld: 202 mg/dL — ABNORMAL HIGH (ref 70–99)
Phosphorus: 5 mg/dL — ABNORMAL HIGH (ref 2.5–4.6)
Potassium: 3.5 mmol/L (ref 3.5–5.1)
Sodium: 138 mmol/L (ref 135–145)

## 2022-10-14 LAB — GLUCOSE, CAPILLARY
Glucose-Capillary: 142 mg/dL — ABNORMAL HIGH (ref 70–99)
Glucose-Capillary: 111 mg/dL — ABNORMAL HIGH (ref 70–99)
Glucose-Capillary: 136 mg/dL — ABNORMAL HIGH (ref 70–99)
Glucose-Capillary: 149 mg/dL — ABNORMAL HIGH (ref 70–99)
Glucose-Capillary: 261 mg/dL — ABNORMAL HIGH (ref 70–99)

## 2022-10-14 MED ORDER — PROCHLORPERAZINE EDISYLATE 10 MG/2ML IJ SOLN
10.0000 mg | Freq: Four times a day (QID) | INTRAMUSCULAR | Status: DC | PRN
  Administered 2022-10-14: 10 mg via INTRAVENOUS
  Filled 2022-10-14: qty 2

## 2022-10-14 MED ORDER — HYDRALAZINE HCL 50 MG PO TABS
100.0000 mg | ORAL_TABLET | Freq: Three times a day (TID) | ORAL | Status: DC
  Administered 2022-10-14 – 2022-10-15 (×3): 100 mg via ORAL
  Filled 2022-10-14 (×3): qty 2

## 2022-10-14 MED ORDER — HEPARIN SODIUM (PORCINE) 1000 UNIT/ML IJ SOLN
INTRAMUSCULAR | Status: AC
  Administered 2022-10-14: 1000 [IU]
  Filled 2022-10-14: qty 4

## 2022-10-14 MED ORDER — INSULIN GLARGINE-YFGN 100 UNIT/ML ~~LOC~~ SOLN
15.0000 [IU] | Freq: Every day | SUBCUTANEOUS | Status: DC
  Administered 2022-10-14: 15 [IU] via SUBCUTANEOUS
  Filled 2022-10-14 (×2): qty 0.15

## 2022-10-14 MED ORDER — SODIUM CHLORIDE 0.9 % IV SOLN
250.0000 mg | Freq: Once | INTRAVENOUS | Status: AC
  Administered 2022-10-14: 250 mg via INTRAVENOUS
  Filled 2022-10-14: qty 20

## 2022-10-14 NOTE — Progress Notes (Signed)
   10/14/22 1240  Vitals  Temp 98 F (36.7 C)  Pulse Rate 88 (Simultaneous filing. User may not have seen previous data.)  Resp (!) 23 (Simultaneous filing. User may not have seen previous data.)  BP (!) 170/64 (Simultaneous filing. User may not have seen previous data.)  SpO2 100 % (Simultaneous filing. User may not have seen previous data.)  O2 Device Nasal Cannula  Post Treatment  Dialyzer Clearance Lightly streaked  Duration of HD Treatment -hour(s) 3.5 hour(s)  Liters Processed 83  Fluid Removed (mL) 2000 mL  Tolerated HD Treatment Yes   Received patient in bed to unit.  Alert and oriented.  Informed consent signed and in chart.   [place] duration:3.5  Patient tolerated well.  Transported back to the room  Alert, without acute distress.  Hand-off given to patient's nurse.   Access used: [facility] Access issues: NONE  Total UF removed: 2L Medication(s) given: NONE    [name]

## 2022-10-14 NOTE — Progress Notes (Signed)
PROGRESS NOTE    [name]  [id number] DOB: Jan 16, 1971 DOA: 10/04/2022 PCP: [name], PA   Brief Narrative:  52/F with history of type 1 diabetes mellitus, diabetic gastroparesis, recurrent admissions for nausea and vomiting, CKD 4 now progressing to stage V, cardiac arrest 04/05/2022 (witnessed cardiac arrest at a restaurant, able AED advised shock, echo with preserved EF, mildly elevated PASP, underwent single-chamber ICD on 04/09/22, since then has been admitted multiple times with nausea and vomiting due to diabetic gastroparesis presented this time with abdominal pain nausea vomiting workup suggestive of RSV pneumonitis likely causing exacerbation of diabetic gastroparesis due to viral infection, was admitted to the hospital for further care .   Assessment & Plan:   Principal Problem:   Hematemesis Active Problems:   Pneumonia due to respiratory syncytial virus (RSV)   NSVT (nonsustained ventricular tachycardia) (HCC)   Chronic combined systolic and diastolic heart failure (HCC)  Diabetic gastroparesis acute on chronic exacerbation due to RSV viral infection causing recurrent and persistent nausea vomiting.   -Uremia from renal failure may be contributing, nausea has improved after starting dialysis -GI signed off.  Can IV erythromycin.  Add as needed Compazine.  She has allergy to Reglan.   Fevers starting 10/07/2022.   -Follow-up blood cultures, so far no growth to date. -Felt to be secondary to transfusion reaction, discussed with Dr. Alvy Bimler , she recommends Benadryl and will before transfusion, as well can add steroids if needed.  Chest x-ray and recent UA unremarkable. 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  -TDC placed by IR and she was started on HD on 10/07/2022. -Management per renal, she is clipped.   RSV viral pneumonitis.  Currently not hypoxic.  No acute issues, this has clinically resolved.   Acute on chronic anemia.    - Does have underlying anemia of chronic disease with acute drop due to multiple blood draws and acute illness, also could have some mild upper GI bleed due to persistent nausea vomiting causing Mallory-Weiss tear, IV PPI, hold heparin and aspirin for few days, she was ordered 1 unit of packed RBC transfusion on 10/07/2022 but had a mild febrile transfusion reaction, transfusion labs checked which are stable, case discussed with pathologist on call on 10/08/2022.  All changes consistent with mild febrile transfusion reaction. -Received 1 unit PRBC 3/6, hemoglobin remained stable, continue to monitor -Procrit and IV iron per renal   Chronic combined diastolic and systolic heart failure history of V. tach with cardiac arrest requiring AICD placement.  EF 40 to 45%. Cardiology is following, currently compensated, continue combination of beta-blocker, Isordil and hydralazine, no ACE/ARB due to underlying renal insufficiency, mild troponin bump in non-ACS pattern due to underlying CHF and stress of #1 and 2 above, continue monitoring with supportive care.   Hypertension. on metoprolol, Isordil, hydralazine,  and amlodipine.  Blood pressure elevated, will increase hydralazine to 100 mg 3 times daily.  Please note that she has not received her morning meds today.   Left arm AV fistula.  Clotted off.  VVS following.   Dyslipidemia.  On statin.     DM type II.  On sliding scale and Lantus.  Blood elevated, will increase Lantus from 10 units to 15 units nightly and continue SSI.  DVT prophylaxis: Place and maintain sequential compression device Start: 10/07/22 0554   Code Status: Full Code  Family Communication:  None present at bedside.  Plan of care discussed with  patient in length and he/she verbalized understanding and agreed with it.  Status is: Inpatient Remains inpatient appropriate because: Patient is still hypertensive and hyperglycemic.   Estimated body mass index is 24.43 kg/m as calculated  from the following:   Height as of this encounter: 6' (1.829 m).   Weight as of this encounter: 81.7 kg.    Nutritional Assessment: Body mass index is 24.43 kg/m.Marland Kitchen Seen by dietician.  I agree with the assessment and plan as outlined below: Nutrition Status: Nutrition Problem: Inadequate oral intake Etiology: nausea, vomiting Signs/Symptoms: per patient/family report Interventions: Refer to RD note for recommendations  . Skin Assessment: I have examined the patient's skin and I agree with the wound assessment as performed by the wound care RN as outlined below:    Consultants:  Nephrology and IR GI signed off  Procedures:  As above  Antimicrobials:  Anti-infectives (From admission, onward)    Start     Dose/Rate Route Frequency Ordered Stop   10/12/22 1200  erythromycin 500 mg in sodium chloride 0.9 % 100 mL IVPB        500 mg 100 mL/hr over 60 Minutes Intravenous 3 times daily with meals 10/12/22 0939     10/10/22 0945  fluconazole (DIFLUCAN) tablet 150 mg        150 mg Oral  Once 10/10/22 0857 10/10/22 1205   10/08/22 1230  vancomycin (VANCOREADY) IVPB 1500 mg/300 mL        1,500 mg 150 mL/hr over 120 Minutes Intravenous  Once 10/08/22 1159 10/08/22 1413   10/08/22 1205  vancomycin variable dose per unstable renal function (pharmacist dosing)  Status:  Discontinued         Does not apply See admin instructions 10/08/22 1205 10/09/22 1007   10/07/22 1315  vancomycin (VANCOCIN) IVPB 1000 mg/200 mL premix        1,000 mg 200 mL/hr over 60 Minutes Intravenous  Once 10/07/22 1219 10/07/22 1545   10/06/22 0600  cefTRIAXone (ROCEPHIN) 2 g in sodium chloride 0.9 % 100 mL IVPB  Status:  Discontinued        2 g 200 mL/hr over 30 Minutes Intravenous Daily with breakfast 10/06/22 0141 10/07/22 0742   10/05/22 0800  aztreonam (AZACTAM) 1 g in sodium chloride 0.9 % 100 mL IVPB  Status:  Discontinued        1 g 200 mL/hr over 30 Minutes Intravenous Every 8 hours 10/05/22 0714  10/06/22 0141   10/04/22 1900  doxycycline (VIBRAMYCIN) 100 mg in sodium chloride 0.9 % 250 mL IVPB  Status:  Discontinued        100 mg 125 mL/hr over 120 Minutes Intravenous Every 12 hours 10/04/22 1801 10/07/22 0742         Subjective: Seen and examined in dialysis unit.  She says that she has no complaints.  She is fully alert and oriented.  Objective: Vitals:   10/14/22 1000 10/14/22 1030 10/14/22 1100 10/14/22 1130  BP: (!) 192/69 (!) 200/65 (!) 197/86 (!) 188/72  Pulse: 93 94 90 89  Resp: (!) '26 20 20 '$ (!) 22  Temp:      TempSrc:      SpO2: 100% 100% 100% 100%  Weight:      Height:        Intake/Output Summary (Last 24 hours) at 10/14/2022 1203 Last data filed at 10/13/2022 1600 Gross per 24 hour  Intake 120 ml  Output --  Net 120 ml   [employer]  10/09/22 1448 10/11/22 0746 10/11/22 1242  Weight: 82.2 kg 82.7 kg 81.7 kg    Examination:  General exam: Appears calm and comfortable  Respiratory system: Clear to auscultation. Respiratory effort normal. Cardiovascular system: S1 & S2 heard, RRR. No JVD, murmurs, rubs, gallops or clicks. No pedal edema. Gastrointestinal system: Abdomen is nondistended, soft and nontender. No organomegaly or masses felt. Normal bowel sounds heard. Central nervous system: Alert and oriented. No focal neurological deficits. Extremities: Symmetric 5 x 5 power. Skin: No rashes, lesions or ulcers Psychiatry: Judgement and insight appear normal. Mood & affect appropriate.    Data Reviewed: I have personally reviewed following labs and imaging studies  CBC: Recent Labs  Lab 10/08/22 1627 10/09/22 0602 10/10/22 0543 10/11/22 0818 10/12/22 0329 10/14/22 0805  WBC 6.1 5.5 5.9 8.5 9.9 9.7  NEUTROABS 4.8 3.9 5.0 7.6 6.5  --   HGB 7.0* 6.4* 8.5* 7.9* 8.2* 8.4*  HCT 20.7* 20.5* 25.9* 24.5* 24.7* 26.2*  MCV 87.0 88.7 87.2 89.7 87.6 89.4  PLT 195 181 202 240 229 [id number]   Basic Metabolic Panel: Recent Labs  Lab 10/08/22 0606  10/09/22 0602 10/10/22 0543 10/11/22 0818 10/12/22 0329 10/14/22 0805  NA 134* 134* 136 137 137 138  K 3.7 3.5 3.5 3.3* 3.1* 3.5  CL 100 97* 100 100 99 100  CO2 19* '23 23 26 28 26  '$ GLUCOSE 147* 123* 249* 282* 130* 202*  BUN 46* 62* 42* 66* 35* 56*  CREATININE 4.62* 6.21* 4.51* 5.26* 3.63* 5.21*  CALCIUM 7.5* 7.7* 7.7* 7.6* 7.4* 8.1*  MG 1.6* 1.8 1.8 1.7  --   --   PHOS  --   --   --   --   --  5.0*   GFR: Estimated Creatinine Clearance: 14.6 mL/min (A) (by C-G formula based on SCr of 5.21 mg/dL (H)). Liver Function Tests: Recent Labs  Lab 10/08/22 0606 10/09/22 0602 10/10/22 0543 10/11/22 0818 10/12/22 0329 10/14/22 0805  AST 59* 68* 35 23 23  --   ALT 84* 84* 67* 46* 36  --   ALKPHOS 149* 234* 216* 167* 169*  --   BILITOT 0.8 0.8 0.9 0.6 0.6  --   PROT 5.2* 4.9* 5.5* 5.1* 5.2*  --   ALBUMIN 2.3* 2.1* 2.3* 2.1* 2.1* 2.2*   No results for input(s): "LIPASE", "AMYLASE" in the last 168 hours. No results for input(s): "AMMONIA" in the last 168 hours. Coagulation Profile: No results for input(s): "INR", "PROTIME" in the last 168 hours. Cardiac Enzymes: No results for input(s): "CKTOTAL", "CKMB", "CKMBINDEX", "TROPONINI" in the last 168 hours. BNP (last 3 results) No results for input(s): "PROBNP" in the last 8760 hours. HbA1C: No results for input(s): "HGBA1C" in the last 72 hours. CBG: Recent Labs  Lab 10/12/22 2052 10/13/22 0754 10/13/22 1135 10/13/22 1649 10/13/22 2029  GLUCAP 223* 149* 159* 192* 220*   Lipid Profile: No results for input(s): "CHOL", "HDL", "LDLCALC", "TRIG", "CHOLHDL", "LDLDIRECT" in the last 72 hours. Thyroid Function Tests: No results for input(s): "TSH", "T4TOTAL", "FREET4", "T3FREE", "THYROIDAB" in the last 72 hours. Anemia Panel: No results for input(s): "VITAMINB12", "FOLATE", "FERRITIN", "TIBC", "IRON", "RETICCTPCT" in the last 72 hours. Sepsis Labs: Recent Labs  Lab 10/08/22 1055  PROCALCITON 8.13    Recent Results (from the  past 240 hour(s))  Resp panel by RT-PCR (RSV, Flu A&B, Covid) Anterior Nasal Swab     Status: Abnormal   Collection Time: 10/04/22 11:26 AM   Specimen: Anterior Nasal Swab  Result  Value Ref Range Status   SARS Coronavirus 2 by RT PCR NEGATIVE NEGATIVE Final    Comment: (NOTE) SARS-CoV-2 target nucleic acids are NOT DETECTED.  The SARS-CoV-2 RNA is generally detectable in upper respiratory specimens during the acute phase of infection. The lowest concentration of SARS-CoV-2 viral copies this assay can detect is 138 copies/mL. A negative result does not preclude SARS-Cov-2 infection and should not be used as the sole basis for treatment or other patient management decisions. A negative result may occur with  improper specimen collection/handling, submission of specimen other than nasopharyngeal swab, presence of viral mutation(s) within the areas targeted by this assay, and inadequate number of viral copies(<138 copies/mL). A negative result must be combined with clinical observations, patient history, and epidemiological information. The expected result is Negative.  Fact Sheet for Patients:  EntrepreneurPulse.com.au  Fact Sheet for Healthcare Providers:  IncredibleEmployment.be  This test is no t yet approved or cleared by the [place] FDA and  has been authorized for detection and/or diagnosis of SARS-CoV-2 by FDA under an Emergency Use Authorization (EUA). This EUA will remain  in effect (meaning this test can be used) for the duration of the COVID-19 declaration under Section 564(b)(1) of the Act, 21 U.S.C.section 360bbb-3(b)(1), unless the authorization is terminated  or revoked sooner.       Influenza A by PCR NEGATIVE NEGATIVE Final   Influenza B by PCR NEGATIVE NEGATIVE Final    Comment: (NOTE) The Xpert Xpress SARS-CoV-2/FLU/RSV plus assay is intended as an aid in the diagnosis of influenza from Nasopharyngeal swab specimens  and should not be used as a sole basis for treatment. Nasal washings and aspirates are unacceptable for Xpert Xpress SARS-CoV-2/FLU/RSV testing.  Fact Sheet for Patients: EntrepreneurPulse.com.au  Fact Sheet for Healthcare Providers: IncredibleEmployment.be  This test is not yet approved or cleared by the [place] FDA and has been authorized for detection and/or diagnosis of SARS-CoV-2 by FDA under an Emergency Use Authorization (EUA). This EUA will remain in effect (meaning this test can be used) for the duration of the COVID-19 declaration under Section 564(b)(1) of the Act, 21 U.S.C. section 360bbb-3(b)(1), unless the authorization is terminated or revoked.     Resp Syncytial Virus by PCR POSITIVE (A) NEGATIVE Final    Comment: (NOTE) Fact Sheet for Patients: EntrepreneurPulse.com.au  Fact Sheet for Healthcare Providers: IncredibleEmployment.be  This test is not yet approved or cleared by the [place] FDA and has been authorized for detection and/or diagnosis of SARS-CoV-2 by FDA under an Emergency Use Authorization (EUA). This EUA will remain in effect (meaning this test can be used) for the duration of the COVID-19 declaration under Section 564(b)(1) of the Act, 21 U.S.C. section 360bbb-3(b)(1), unless the authorization is terminated or revoked.  Performed at [facility], [address] [place], [place] [address]   Culture, blood (routine x 2)     Status: None   Collection Time: 10/04/22  6:08 PM   Specimen: BLOOD RIGHT HAND  Result Value Ref Range Status   Specimen Description BLOOD RIGHT HAND  Final   Special Requests   Final    BOTTLES DRAWN AEROBIC AND ANAEROBIC Blood Culture adequate volume   Culture   Final    NO GROWTH 5 DAYS Performed at [facility], [place] [address], [place], [place] [address]    Report Status 10/09/2022 FINAL  Final  Culture,  blood (Routine X 2) w Reflex to ID Panel     Status: None  Collection Time: 10/07/22  9:11 PM   Specimen: BLOOD  Result Value Ref Range Status   Specimen Description BLOOD SITE NOT SPECIFIED  Final   Special Requests   Final    BOTTLES DRAWN AEROBIC AND ANAEROBIC Blood Culture adequate volume   Culture   Final    NO GROWTH 5 DAYS Performed at [facility], [address] [place], [place] [address]    Report Status 10/12/2022 FINAL  Final  Culture, blood (Routine X 2) w Reflex to ID Panel     Status: None   Collection Time: 10/07/22  9:11 PM   Specimen: BLOOD  Result Value Ref Range Status   Specimen Description BLOOD SITE NOT SPECIFIED  Final   Special Requests   Final    BOTTLES DRAWN AEROBIC AND ANAEROBIC Blood Culture adequate volume   Culture   Final    NO GROWTH 5 DAYS Performed at [facility], [place] [address], [place], [place] [address]    Report Status 10/12/2022 FINAL  Final  MRSA Next Gen by PCR, Nasal     Status: None   Collection Time: 10/08/22  7:41 AM   Specimen: Nasal Mucosa; Nasal Swab  Result Value Ref Range Status   MRSA by PCR Next Gen NOT DETECTED NOT DETECTED Final    Comment: (NOTE) The GeneXpert MRSA Assay (FDA approved for NASAL specimens only), is one component of a comprehensive MRSA colonization surveillance program. It is not intended to diagnose MRSA infection nor to guide or monitor treatment for MRSA infections. Test performance is not FDA approved in patients less than 61 years old. Performed at [facility], [place] [address] [place], [place] [address]   Culture, blood (Routine X 2) w Reflex to ID Panel     Status: None   Collection Time: 10/08/22 10:47 AM   Specimen: BLOOD RIGHT HAND  Result Value Ref Range Status   Specimen Description BLOOD RIGHT HAND  Final   Special Requests   Final    BOTTLES DRAWN AEROBIC AND ANAEROBIC Blood Culture adequate volume   Culture   Final    NO GROWTH 5 DAYS Performed at [facility], [place] [address] [place], [place] [address]    Report Status 10/13/2022 FINAL  Final  Culture, blood (Routine X 2) w Reflex to ID Panel     Status: None   Collection Time: 10/08/22  4:27 PM   Specimen: BLOOD  Result Value Ref Range Status   Specimen Description BLOOD RIGHT ANTECUBITAL  Final   Special Requests   Final    BOTTLES DRAWN AEROBIC AND ANAEROBIC Blood Culture adequate volume   Culture   Final    NO GROWTH 5 DAYS Performed at [facility], [place] [address] [place], [place] [address]    Report Status 10/13/2022 FINAL  Final     Radiology Studies: No results found.  Scheduled Meds:  amLODipine  10 mg Oral Daily   aspirin EC  81 mg Oral Daily   Chlorhexidine Gluconate Cloth  6 each Topical Q0600   darbepoetin (ARANESP) injection - DIALYSIS  100 mcg Subcutaneous Q Thu-1800   feeding supplement  237 mL Oral BID WC   hydrALAZINE  100 mg Oral Q8H   insulin aspart  0-5 Units Subcutaneous QHS   insulin aspart  0-9 Units Subcutaneous TID WC   insulin glargine-yfgn  10 Units Subcutaneous QHS   isosorbide dinitrate  20 mg Oral TID  metoprolol succinate  50 mg Oral Daily   multivitamin  1 tablet Oral QHS   pantoprazole  40 mg Oral BID AC   pravastatin  40 mg Oral q1800   scopolamine  1 patch Transdermal Q72H   Continuous Infusions:  erythromycin 500 mg (10/13/22 1829)   ferric gluconate (FERRLECIT) IVPB Stopped (10/11/22 1015)     LOS: 10 days   Darliss Cheney, MD [facility]  10/14/2022, 12:03 PM   *Please note that this is a verbal dictation therefore any spelling or grammatical errors are due to the "[place]" system interpretation.  Please page via [place] and do not message via secure chat for urgent patient care matters. Secure chat can be used for non urgent patient care matters.  How to contact the [facility] Attending or Consulting provider [place] or covering provider during after hours [place], for this patient?  Check the care team in  [facility] and look for a) attending/consulting TRH provider listed and b) the [facility] team listed. Page or secure chat [phone]. Log into [email or web address] and use [place]'s universal password to access. If you do not have the password, please contact the hospital operator. Locate the [facility] provider you are looking for under [facility] and page to a number that you can be directly reached. If you still have difficulty reaching the provider, please page the [facility] (Director on Call) for the Hospitalists listed on amion for assistance.

## 2022-10-14 NOTE — Progress Notes (Signed)
Pt rinsed  back due to system clotting new. System exchange treatment resumed w/o compilations

## 2022-10-14 NOTE — Procedures (Signed)
Patient was seen on dialysis and the procedure was supervised.  BFR 400  Via TDC BP is  192/69.   Patient appears to be tolerating treatment well  Louis Meckel 10/14/2022

## 2022-10-14 NOTE — Progress Notes (Signed)
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 Recent Left AVF placed by Dr. Donnetta Hutching. TDC placed by IR w/ HD started on 10/07/22. Recent progressive CKD in the setting of multiple hospitalization, ongoing gastroparesis flare etc.  She is on antibiotics for pneumonia.   - TDC placed by IR, appreciate assistance - CLIP'd - HD today, w/ increase UF goal to get off O2   # Possible pneumonia/acute hypoxic respiratory failure/RSV+: s/p ceftriaxone and doxy. currently on oxygen.  -Plans per primary team   # Acute on chronic diastolic CHF with fluid overload/pleural effusion: Echo with EF of 40 to 45%, mild concentric LVH.   - s/p IV diuretics, HD today - Plans per primary team    # CKD-MBD: Corrected calcium and phosphorus level acceptable for CKD.   # Anemia of CKD: - started iron load 3/4 - s/p 1 u pRBCs 10/09/22 - ESA q Thursday  # Hypertension/volume: Currently on cardiac medication including metoprolol, Imdur, hydralazine. Blood pressure elevated today, hopefull HD will help. - Plans per primary team   #fevers: Blood cultures 3/5 w/ NGTD. Timing appears c/w febrile transfusion reaction- but she has a new TDC too, she got one dose of vanc, stopped 3/6. PCT up to 8.13--> but can be elevated after febrile reactions. No fevers overnight. - closely monitor on dialysis and the new catheter    Subjective:    Doing well this morning   Objective:   BP (!) 196/81 (BP Location: Left Leg)   Pulse 94   Temp 97.7 F (36.5 C) (Oral)   Resp (!) 22   Ht 6' (1.829 m)   Wt 81.7 kg   LMP 11/17/2021 (Approximate)   SpO2 100%   BMI 24.43 kg/m   Physical Exam: Gen:NAD, laying comfortably in bed CVS: RRR Resp:CTABL Abd: Soft, NTTP Ext: no edema ACCESS: L AVF thrombosed, R IJ TDC   Labs: BMET Recent Labs  Lab 10/08/22 0606 10/09/22 0602  10/10/22 0543 10/11/22 0818 10/12/22 0329 10/14/22 0805  NA 134* 134* 136 137 137 138  K 3.7 3.5 3.5 3.3* 3.1* 3.5  CL 100 97* 100 100 99 100  CO2 19* '23 23 26 28 26  '$ GLUCOSE 147* 123* 249* 282* 130* 202*  BUN 46* 62* 42* 66* 35* 56*  CREATININE 4.62* 6.21* 4.51* 5.26* 3.63* 5.21*  CALCIUM 7.5* 7.7* 7.7* 7.6* 7.4* 8.1*  PHOS  --   --   --   --   --  5.0*    CBC Recent Labs  Lab 10/09/22 0602 10/10/22 0543 10/11/22 0818 10/12/22 0329 10/14/22 0805  WBC 5.5 5.9 8.5 9.9 9.7  NEUTROABS 3.9 5.0 7.6 6.5  --   HGB 6.4* 8.5* 7.9* 8.2* 8.4*  HCT 20.5* 25.9* 24.5* 24.7* 26.2*  MCV 88.7 87.2 89.7 87.6 89.4  PLT 181 202 240 229 340       Medications:     amLODipine  10 mg Oral Daily   aspirin EC  81 mg Oral Daily   Chlorhexidine Gluconate Cloth  6 each Topical Q0600   darbepoetin (ARANESP) injection - DIALYSIS  100 mcg Subcutaneous Q Thu-1800   feeding supplement  237 mL Oral BID WC   hydrALAZINE  75 mg Oral Q8H   insulin aspart  0-5 Units Subcutaneous QHS   insulin aspart  0-9 Units Subcutaneous TID WC   insulin glargine-yfgn  10  Units Subcutaneous QHS   isosorbide dinitrate  20 mg Oral TID   metoprolol succinate  50 mg Oral Daily   multivitamin  1 tablet Oral QHS   pantoprazole  40 mg Oral BID AC   pravastatin  40 mg Oral q1800   scopolamine  1 patch Transdermal Q72H     Holley Bouche, MD 10/14/22 10:10 am

## 2022-10-15 ENCOUNTER — Other Ambulatory Visit (HOSPITAL_COMMUNITY): Payer: Self-pay

## 2022-10-15 DIAGNOSIS — K920 Hematemesis: Secondary | ICD-10-CM | POA: Diagnosis not present

## 2022-10-15 LAB — RENAL FUNCTION PANEL
Albumin: 2.2 g/dL — ABNORMAL LOW (ref 3.5–5.0)
Anion gap: 13 (ref 5–15)
BUN: 37 mg/dL — ABNORMAL HIGH (ref 6–20)
CO2: 24 mmol/L (ref 22–32)
Calcium: 7.7 mg/dL — ABNORMAL LOW (ref 8.9–10.3)
Chloride: 99 mmol/L (ref 98–111)
Creatinine, Ser: 4.17 mg/dL — ABNORMAL HIGH (ref 0.44–1.00)
GFR, Estimated: 12 mL/min — ABNORMAL LOW (ref >60)
Glucose, Bld: 263 mg/dL — ABNORMAL HIGH (ref 70–99)
Phosphorus: 4.3 mg/dL (ref 2.5–4.6)
Potassium: 3.8 mmol/L (ref 3.5–5.1)
Sodium: 136 mmol/L (ref 135–145)

## 2022-10-15 LAB — CBC
HCT: 25.8 % — ABNORMAL LOW (ref 36.0–46.0)
Hemoglobin: 8.2 g/dL — ABNORMAL LOW (ref 12.0–15.0)
MCH: 28.4 pg (ref 26.0–34.0)
MCHC: 31.8 g/dL (ref 30.0–36.0)
MCV: 89.3 fL (ref 80.0–100.0)
Platelets: 307 10*3/uL (ref 150–400)
RBC: 2.89 MIL/uL — ABNORMAL LOW (ref 3.87–5.11)
RDW: 15.7 % — ABNORMAL HIGH (ref 11.5–15.5)
WBC: 9.8 10*3/uL (ref 4.0–10.5)
nRBC: 0 % (ref 0.0–0.2)

## 2022-10-15 LAB — GLUCOSE, CAPILLARY
Glucose-Capillary: 246 mg/dL — ABNORMAL HIGH (ref 70–99)
Glucose-Capillary: 170 mg/dL — ABNORMAL HIGH (ref 70–99)

## 2022-10-15 MED ORDER — HYDRALAZINE HCL 100 MG PO TABS
100.0000 mg | ORAL_TABLET | Freq: Three times a day (TID) | ORAL | 0 refills | Status: DC
  Filled 2022-10-15: qty 90, 30d supply, fill #0

## 2022-10-15 MED ORDER — AMLODIPINE BESYLATE 10 MG PO TABS
10.0000 mg | ORAL_TABLET | Freq: Every day | ORAL | 0 refills | Status: DC
  Filled 2022-10-15: qty 30, 30d supply, fill #0

## 2022-10-15 MED ORDER — ASPIRIN 81 MG PO TBEC
81.0000 mg | DELAYED_RELEASE_TABLET | Freq: Every day | ORAL | 12 refills | Status: DC
  Filled 2022-10-15: qty 30, 30d supply, fill #0

## 2022-10-15 MED ORDER — ISOSORBIDE DINITRATE 20 MG PO TABS
20.0000 mg | ORAL_TABLET | Freq: Three times a day (TID) | ORAL | 0 refills | Status: DC
  Filled 2022-10-15: qty 90, 30d supply, fill #0

## 2022-10-15 NOTE — Progress Notes (Signed)
D/C order noted. Spoke to pt via phone. Reminded pt of appt for out-pt HD tomorrow and to please arrive at 9:45 to complete paperwork prior to treatment. Pt voices understanding and agreeable to plan. Arrangements added to AVS as well. Contacted [place] to advise clinic that pt for d/c today and will start at clinic tomorrow. Contacted renal PA regarding clinic's need for orders.   Melven Sartorius Renal Navigator [phone]

## 2022-10-15 NOTE — Progress Notes (Signed)
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 Recent Left AVF placed by Dr. Donnetta Hutching, and has clotted off. TDC placed by IR w/ HD started on 10/07/22. Recent progressive CKD in the setting of multiple hospitalization, ongoing gastroparesis flare. Patient had HD yesterday w/ improvement in renal labs, Cr 4.17 (5.21), BUN 37 (56), Phos 4.3 (5.0). Patient stable and ready for d/c from renal standpoint, will likely d/c today given improved gastroparesis.  - TDC placed by IR, appreciate assistance - CLIP'd - HD tomorrow   # Possible pneumonia/acute hypoxic respiratory failure/RSV+: s/p ceftriaxone and doxy.   -Plans per primary team   # Acute on chronic diastolic CHF with fluid overload/pleural effusion: Echo with EF of 40 to 45%, mild concentric LVH.   - s/p IV diuretics and HD yesterday - Plans per primary team    # CKD-MBD: Corrected calcium and phosphorus level are WNL.   # Anemia of CKD: Hgb stable today at 8.2. - started iron load 3/4 - s/p 1 u pRBCs 10/09/22 - ESA q Thursday  # Hypertension/volume: Currently on cardiac medication including metoprolol, Imdur, hydralazine. Blood pressure elevated better this morning, HD appears to have helped. - Plans per primary team   #fevers: Blood cultures 3/5 w/ NGTD. Timing appears c/w febrile transfusion reaction- but she has a new TDC too, she got one dose of vanc, stopped 3/6. PCT up to 8.13--> but can be elevated after febrile reactions. No fevers overnight. - closely monitor on dialysis and the new catheter    Subjective:    Feeling good this morning, ate her breakfast and tolerated it well   Objective:   BP (!) 147/65 (BP Location: Left Leg)   Pulse 77   Temp 97.6 F (36.4 C) (Oral)   Resp 18   Ht 6' (1.829 m)   Wt 81.7 kg   LMP 11/17/2021 (Approximate)   SpO2 98%   BMI 24.43 kg/m    Physical Exam: Gen:NAD, laying sitting up in chair CVS: RRR, s1/s2, NRMG Resp:CTABL Abd: Soft, NTTP, non-distended Ext: no edema ACCESS: L AVF thrombosed, R IJ TDC   Labs: BMET Recent Labs  Lab 10/09/22 0602 10/10/22 0543 10/11/22 0818 10/12/22 0329 10/14/22 0805 10/15/22 0637  NA 134* 136 137 137 138 136  K 3.5 3.5 3.3* 3.1* 3.5 3.8  CL 97* 100 100 99 100 99  CO2 '23 23 26 28 26 24  '$ GLUCOSE 123* 249* 282* 130* 202* 263*  BUN 62* 42* 66* 35* 56* 37*  CREATININE 6.21* 4.51* 5.26* 3.63* 5.21* 4.17*  CALCIUM 7.7* 7.7* 7.6* 7.4* 8.1* 7.7*  PHOS  --   --   --   --  5.0* 4.3    CBC Recent Labs  Lab 10/09/22 0602 10/10/22 0543 10/11/22 0818 10/12/22 0329 10/14/22 0805 10/15/22 0637  WBC 5.5 5.9 8.5 9.9 9.7 9.8  NEUTROABS 3.9 5.0 7.6 6.5  --   --   HGB 6.4* 8.5* 7.9* 8.2* 8.4* 8.2*  HCT 20.5* 25.9* 24.5* 24.7* 26.2* 25.8*  MCV 88.7 87.2 89.7 87.6 89.4 89.3  PLT 181 202 240 229 340 307       Medications:     amLODipine  10 mg Oral Daily   aspirin EC  81 mg Oral Daily   Chlorhexidine Gluconate Cloth  6 each Topical Q0600   darbepoetin (ARANESP) injection - DIALYSIS  100 mcg Subcutaneous  Q Thu-1800   feeding supplement  237 mL Oral BID WC   hydrALAZINE  100 mg Oral Q8H   insulin aspart  0-5 Units Subcutaneous QHS   insulin aspart  0-9 Units Subcutaneous TID WC   insulin glargine-yfgn  15 Units Subcutaneous QHS   isosorbide dinitrate  20 mg Oral TID   metoprolol succinate  50 mg Oral Daily   multivitamin  1 tablet Oral QHS   pantoprazole  40 mg Oral BID AC   pravastatin  40 mg Oral q1800   scopolamine  1 patch Transdermal Q72H     Holley Bouche, MD 10/15/22 12:15 pm

## 2022-10-15 NOTE — Progress Notes (Signed)
Occupational Therapy Treatment Patient Details Name: [name] MRN: [id number] DOB: 1971/06/25 Today's Date: 10/15/2022   History of present illness Patient is a 52 y/o female who presents on 3/1 with abdominal pain, N/V, SOB and possible ICD shocks. Found to have RSV PNA, AKI on CKD with likely initiation of dialysis, chronic gastroparesis and thrombosed LUE AVF. PMH includes HTN, DM2, gastroparesis, CKD, CHF, sickle cell trait, depression, neuropathy.   OT comments  Patient demonstrated good progress this treatment session with patient making gains with bed mobility and transfers. Patient performed grooming seated due to continued fatigue when standing and decreased standing tolerance. Patient demonstrated gains with LB dressing with donning socks with supervision. Patient left up in recliner to increase OOB sitting tolerance. Acute OT to continue to follow to address standing ADLs and functional transfers.    Recommendations for follow up therapy are one component of a multi-disciplinary discharge planning process, led by the attending physician.  Recommendations may be updated based on patient status, additional functional criteria and insurance authorization.    Follow Up Recommendations  Home health OT (pending acute progress)     Assistance Recommended at Discharge Frequent or constant Supervision/Assistance  Patient can return home with the following  A little help with walking and/or transfers;A little help with bathing/dressing/bathroom;Assistance with cooking/housework;Assist for transportation;Help with stairs or ramp for entrance   Equipment Recommendations  Other (comment) (pending acute progress)    Recommendations for Other Services      Precautions / Restrictions Precautions Precautions: Fall Restrictions Weight Bearing Restrictions: No       Mobility Bed Mobility Overal bed mobility: Needs Assistance Bed Mobility: Supine to Sit     Supine to sit: Min  guard, HOB elevated     General bed mobility comments: HOB raised and increased time with use of bed rails    Transfers Overall transfer level: Needs assistance Equipment used: 1 person hand held assist Transfers: Sit to/from Stand, Bed to chair/wheelchair/BSC Sit to Stand: Min assist     Step pivot transfers: Min assist     General transfer comment: transferred to recliner from EOB with HHA     Balance Overall balance assessment: Needs assistance Sitting-balance support: Feet supported, Single extremity supported Sitting balance-Leahy Scale: Fair Sitting balance - Comments: able to maintain balance on EOB   Standing balance support: Single extremity supported, During functional activity Standing balance-Leahy Scale: Poor Standing balance comment: stood for transfer to recliner                           ADL either performed or assessed with clinical judgement   ADL Overall ADL's : Needs assistance/impaired     Grooming: Wash/dry hands;Wash/dry face;Oral care;Brushing hair;Set up;Sitting Grooming Details (indicate cue type and reason): in recliner             Lower Body Dressing: Supervision/safety;Sitting/lateral leans Lower Body Dressing Details (indicate cue type and reason): to donn socks Toilet Transfer: Minimal assistance;Stand-pivot Toilet Transfer Details (indicate cue type and reason): simulated to recliner                Extremity/Trunk Assessment              Vision       Perception     Praxis      Cognition Arousal/Alertness: Awake/alert, Lethargic Behavior During Therapy: Flat affect Overall Cognitive Status: Within Functional Limits for tasks assessed  General Comments: feeling better on this date, motivated to get OOB        Exercises      Shoulder Instructions       General Comments      Pertinent Vitals/ Pain       Pain Assessment Pain Assessment:  Faces Faces Pain Scale: Hurts a little bit Pain Location: generalized Pain Descriptors / Indicators: Discomfort Pain Intervention(s): Monitored during session, Repositioned  Home Living                                          Prior Functioning/Environment              Frequency  Min 2X/week        Progress Toward Goals  OT Goals(current goals can now be found in the care plan section)  Progress towards OT goals: Progressing toward goals  Acute Rehab OT Goals Patient Stated Goal: get better OT Goal Formulation: With patient Time For Goal Achievement: 10/21/22 Potential to Achieve Goals: Good ADL Goals Pt Will Perform Grooming: with min guard assist;standing Pt Will Perform Lower Body Dressing: with min guard assist;sit to/from stand Pt Will Transfer to Toilet: with min guard assist;ambulating Pt Will Perform Toileting - Clothing Manipulation and hygiene: with modified independence;sitting/lateral leans Additional ADL Goal #1: Pt will complete at least 8x minutes of OOB funcitonal activity to demonstrate increased tolerance  Plan Discharge plan remains appropriate    Co-evaluation                 AM-PAC OT "6 Clicks" Daily Activity     Outcome Measure   Help from another person eating meals?: None Help from another person taking care of personal grooming?: A Little Help from another person toileting, which includes using toliet, bedpan, or urinal?: A Little Help from another person bathing (including washing, rinsing, drying)?: A Little Help from another person to put on and taking off regular upper body clothing?: A Little Help from another person to put on and taking off regular lower body clothing?: A Little 6 Click Score: 19    End of Session Equipment Utilized During Treatment: Oxygen  OT Visit Diagnosis: Unsteadiness on feet (R26.81);Other abnormalities of gait and mobility (R26.89);Muscle weakness (generalized) (M62.81)    Activity Tolerance Patient tolerated treatment well   Patient Left in chair;with call bell/phone within reach;with chair alarm set   Nurse Communication Mobility status        Time: [id number] OT Time Calculation (min): 33 min  Charges: OT General Charges $OT Visit: 1 Visit OT Treatments $Self Care/Home Management : 23-37 mins  Lodema Hong, Harold  Office [place] 10/15/2022, 8:33 AM

## 2022-10-15 NOTE — Progress Notes (Signed)
Physical Therapy Treatment Patient Details Name: [name] MRN: [id number] DOB: Apr 14, 1971 Today's Date: 10/15/2022   History of Present Illness Patient is a 52 y/o female who presents on 3/1 with abdominal pain, N/V, SOB and possible ICD shocks. Found to have RSV PNA, AKI on CKD with likely initiation of dialysis, chronic gastroparesis and thrombosed LUE AVF. PMH includes HTN, DM2, gastroparesis, CKD, CHF, sickle cell trait, depression, neuropathy.    PT Comments    Pt greeted up in chair on arrival and agreeable to session with great progress towards acute goal this date. Pt able to progress gait this session with min guard assist for safety and RW support for >150'. Pt without c/o nausea and eager to progress to home with pt endorsing adequate assist at home from family. Pt was educated on continued 4 wheeled walker use to maximize functional independence, safety, and decrease risk for falls as well as energy conservation techniques with pt verbalizing understanding. Discussed with supervising PT and equipment recommendations updated. Anticipate safe discharge, with assist level outlined below, once medically cleared, will continue to follow acutely.    Recommendations for follow up therapy are one component of a multi-disciplinary discharge planning process, led by the attending physician.  Recommendations may be updated based on patient status, additional functional criteria and insurance authorization.  Follow Up Recommendations  Home health PT     Assistance Recommended at Discharge Intermittent Supervision/Assistance  Patient can return home with the following A little help with walking and/or transfers;A little help with bathing/dressing/bathroom;Help with stairs or ramp for entrance;Assist for transportation;Assistance with Landscape architect (4 wheels);BSC/3in1    Recommendations for Other Services       Precautions / Restrictions  Precautions Precautions: Fall Restrictions Weight Bearing Restrictions: No     Mobility  Bed Mobility Overal bed mobility: Needs Assistance             General bed mobility comments: pt OOB pre and post session    Transfers Overall transfer level: Needs assistance Equipment used: Rolling walker (2 wheels) Transfers: Sit to/from Stand, Bed to chair/wheelchair/BSC Sit to Stand: Min assist, Min guard           General transfer comment: min asssit on intitial rise to stand with light cues for hand placement, progessing to min guard    Ambulation/Gait Ambulation/Gait assistance: Min guard Gait Distance (Feet): 160 Feet Assistive device: Rolling walker (2 wheels) Gait Pattern/deviations: Step-to pattern, Step-through pattern, Decreased stride length, Narrow base of support Gait velocity: decr     General Gait Details: mildy unsteady gait with RW support, intitaly step-to pattern progressing to step through, cues for RW proximity and upright posture   Stairs             Wheelchair Mobility    Modified Rankin (Stroke Patients Only)       Balance Overall balance assessment: Needs assistance Sitting-balance support: Feet supported, Single extremity supported Sitting balance-Leahy Scale: Fair Sitting balance - Comments: able to maintain balance on EOB   Standing balance support: Single extremity supported, During functional activity Standing balance-Leahy Scale: Fair Standing balance comment: reliant on UE support for dynamic activity                            Cognition Arousal/Alertness: Awake/alert, Lethargic Behavior During Therapy: WFL for tasks assessed/performed Overall Cognitive Status: Within Functional Limits for tasks assessed  General Comments: feeling better on this date, motivated for session        Exercises      General Comments General comments (skin integrity, edema, etc.):  VSS on RA, educated pt on energy convservation techniques, pt eager to return home      Pertinent Vitals/Pain Pain Assessment Pain Assessment: No/denies pain Pain Intervention(s): Monitored during session    Home Living                          Prior Function            PT Goals (current goals can now be found in the care plan section) Acute Rehab PT Goals Patient Stated Goal: to get home PT Goal Formulation: With patient Time For Goal Achievement: 10/21/22 Progress towards PT goals: Progressing toward goals    Frequency    Min 3X/week      PT Plan Equipment recommendations need to be updated    Co-evaluation              AM-PAC PT "6 Clicks" Mobility   Outcome Measure  Help needed turning from your back to your side while in a flat bed without using bedrails?: A Little Help needed moving from lying on your back to sitting on the side of a flat bed without using bedrails?: A Little Help needed moving to and from a bed to a chair (including a wheelchair)?: A Little Help needed standing up from a chair using your arms (e.g., wheelchair or bedside chair)?: A Little Help needed to walk in hospital room?: A Little Help needed climbing 3-5 steps with a railing? : A Lot 6 Click Score: 17    End of Session   Activity Tolerance: Patient tolerated treatment well Patient left: in chair;with call bell/phone within reach;with chair alarm set Nurse Communication: Mobility status PT Visit Diagnosis: Pain;Muscle weakness (generalized) (M62.81);Unsteadiness on feet (R26.81);Difficulty in walking, not elsewhere classified (R26.2) Pain - Right/Left: Right Pain - part of body: Arm     Time: [id number] PT Time Calculation (min) (ACUTE ONLY): 23 min  Charges:  $Gait Training: 8-22 mins $Therapeutic Activity: 8-22 mins                    Elley Harp R. PTA Acute Rehabilitation Services Office: [place] 10/15/2022, 12:17 PM

## 2022-10-15 NOTE — Progress Notes (Signed)
New Dialysis Start    Patient identified as new dialysis start. Kidney Education packet assembled and given. Discussed the following items with patient:     Current medications and possible changes once started:  Discussed that patient's medications may change over time.  Ex; hypertension medications and diabetes medication.  Nephrologists will adjust as needed.   Fluid restrictions reviewed:  32 oz daily goal:  All liquids count; soups, ice, jello, fruits.   Phosphorus and potassium: Handout given showing high potassium and phosphorus foods.  Alternative food and drink options given.   Family support:  none at bedside.   Outpatient Clinic Resources:  Discussed roles of Outpatient clinic staff and advised to make a list of needs, if any, to talk with outpatient staff if needed.   Care plan schedule: Informed patient of Care Plans in outpatient setting and to participate in the care plan.  An invitation would be given from outpatient clinic.    Dialysis Access Options:  Reviewed access options with patients. Discussed in detail about care at home with new AVG & AVF. Reviewed checking bruit and thrill. If dialysis catheter present, educated that patient could not take showers.  Catheter dressing changes were to be done by outpatient clinic staff only   Home therapy options:  Educated patient about home therapy options:  PD vs home hemo.     Patient verbalized understanding. Will continue to round on patient during admission.    Aurther Loft Dialysis Nurse Coordinator [phone]

## 2022-10-15 NOTE — Discharge Summary (Signed)
Physician Discharge Summary  [name] [id number] DOB: 30-Apr-1971 DOA: 10/04/2022  PCP: [name], PA  Admit date: 10/04/2022 Discharge date: 10/15/2022 30 Day Unplanned Readmission Risk Score    Flowsheet Row ED to Hosp-Admission (Current) from 10/04/2022 in [facility]  30 Day Unplanned Readmission Risk Score (%) 75.75 Filed at 10/15/2022 0801       This score is the patient's risk of an unplanned readmission within 30 days of being discharged (0 -100%). The score is based on dignosis, age, lab data, medications, orders, and past utilization.   Low:  0-14.9   Medium: 15-21.9   High: 22-29.9   Extreme: 30 and above          Admitted From: Home Disposition: Home  Recommendations for Outpatient Follow-up:  Follow up with PCP in 1-2 weeks Please obtain BMP/CBC in one week Follow-up outpatient for dialysis on Mondays Wednesdays and Fridays. Please follow up with your PCP on the following pending results: Unresulted Labs (From admission, onward)    None         Home Health: Yes Equipment/Devices: None  Discharge Condition: Stable CODE STATUS: Full code Diet recommendation: Renal and cardiac  Subjective: Seen and examined at he has no complaints.  She is excited to go home today.  Brief/Interim Summary: 52/F with history of type 1 diabetes mellitus, diabetic gastroparesis, recurrent admissions for nausea and vomiting, CKD, cardiac arrest 04/05/2022 (witnessed cardiac arrest at a restaurant, able AED advised shock, echo with preserved EF, mildly elevated PASP, underwent single-chamber ICD on 04/09/22, since then has been admitted multiple times with nausea and vomiting due to diabetic gastroparesis presented this time with abdominal pain nausea vomiting workup suggestive of RSV pneumonitis and worsening CKD, admitted to hospital service.  Nephrology consulted.  Details below.   Diabetic gastroparesis acute on chronic exacerbation due to RSV  viral infection causing recurrent and persistent nausea vomiting.   -She was initially started on IV erythromycin since she is allergic to Reglan, this did not help much, uremia from renal failure may be contributing, nausea has improved after starting dialysis -GI signed off.  Resume as needed Compazine.  She is totally asymptomatic now.  She is tolerating the diet and eating 100% of the meals.   Fevers starting 10/07/2022.   -Follow-up blood cultures, so far no growth to date. -Felt to be secondary to transfusion reaction, discussed with Dr. Alvy Bimler , she recommends Benadryl and will before transfusion, as well can add steroids if needed.  Chest x-ray and recent UA unremarkable. 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  -Nephrology consulted.  TDC placed by IR and she was started on HD on 10/07/2022. -Management per renal, she is clipped.  She will resume outpatient hemodialysis on Mondays Wednesdays and Fridays and patient is fully aware of that.   RSV viral pneumonitis.  Currently not hypoxic.  No acute issues, this has clinically resolved.   Acute on chronic anemia.   - Does have underlying anemia of chronic disease with acute drop due to multiple blood draws and acute illness, also could have some mild upper GI bleed due to persistent nausea vomiting causing Mallory-Weiss tear, IV PPI, hold heparin and aspirin for few days, she was ordered 1 unit of packed RBC transfusion on 10/07/2022 but had a mild febrile transfusion reaction, transfusion labs checked which are stable, case discussed with pathologist on call on 10/08/2022.  All changes consistent with  mild febrile transfusion reaction. -Received 1 unit PRBC 3/6, hemoglobin remained stable ever since.   Chronic combined diastolic and systolic heart failure history of V. tach with cardiac arrest requiring AICD placement.  EF 40 to 45%. Cardiology is following, currently compensated, continue combination of  beta-blocker, Isordil and hydralazine, mild troponin bump in non-ACS pattern due to underlying CHF and stress of #1 and 2 above, continue monitoring with supportive care.  Discontinued clonidine patch.  Blood pressure controlled.   Hypertension. on metoprolol, Isordil, hydralazine,  and amlodipine.  Blood pressure very well-controlled now.    Left arm AV fistula.  Clotted off.  VVS saw her.   Dyslipidemia.  On statin.     DM type II.  Resume Home meds  Discharge plan was discussed with patient and/or family member and they verbalized understanding and agreed with it.  Discharge Diagnoses:  Principal Problem:   Hematemesis Active Problems:   Pneumonia due to respiratory syncytial virus (RSV)   NSVT (nonsustained ventricular tachycardia) (HCC)   Chronic combined systolic and diastolic heart failure (HCC)    Discharge Instructions   Allergies as of 10/15/2022       Reactions   Atorvastatin Other (See Comments)   Dizziness    Dulaglutide Nausea And Vomiting, Other (See Comments)   Caused pancreatitis    Metoclopramide Other (See Comments)   Possible movement disorder or tardive dyskinesia while on reglan   Other Itching   Peas and carrots   Sertraline Nausea And Vomiting   Zofran [ondansetron] Nausea And Vomiting, Other (See Comments)   Causes nausea vomiting to worsen / doesn't really work for patient.   Amoxicillin Itching, Rash   Lantus [insulin Glargine] Itching, Rash   Has tolerated insulin glargine many times with no reported issues   Miconazole Nitrate Nausea And Vomiting, Rash        Medication List     STOP taking these medications    cloNIDine 0.1 mg/24hr patch Commonly known as: CATAPRES - Dosed in mg/24 hr   metoCLOPramide 10 MG tablet Commonly known as: Reglan   torsemide 20 MG tablet Commonly known as: DEMADEX       TAKE these medications    acetaminophen 500 MG tablet Commonly known as: TYLENOL Take 1,000 mg by mouth as needed for moderate  pain or mild pain.   albuterol 108 (90 Base) MCG/ACT inhaler Commonly known as: ProAir HFA Inhale 2 puffs into the lungs every 6 (six) hours as needed for wheezing or shortness of breath. What changed: when to take this   amLODipine 10 MG tablet Commonly known as: NORVASC Take 1 tablet (10 mg total) by mouth daily. Start taking on: October 16, 2022   aspirin EC 81 MG tablet Take 1 tablet (81 mg total) by mouth daily. Swallow whole. Start taking on: October 16, 2022   Ensure Max Protein Liqd Take 330 mLs (11 oz total) by mouth daily.   hydrALAZINE 100 MG tablet Commonly known as: APRESOLINE Take 1 tablet (100 mg total) by mouth every 8 (eight) hours. What changed:  medication strength how much to take when to take this   hydrOXYzine 50 MG tablet Commonly known as: ATARAX Take 50 mg by mouth in the morning, at noon, and at bedtime.   insulin aspart 100 UNIT/ML injection Commonly known as: novoLOG Inject 150 Units into the skin See admin instructions. Patient uses this with the [place]. Units vary according to blood sugar. Loads 150 units every 3 days.   isosorbide dinitrate  20 MG tablet Commonly known as: ISORDIL Take 1 tablet (20 mg total) by mouth 3 (three) times daily.   LORazepam 0.5 MG tablet Commonly known as: ATIVAN Take 1 tablet (0.5 mg total) by mouth every 8 (eight) hours as needed for anxiety (Refractory nausea and vomiting). What changed: when to take this   metoprolol succinate 50 MG 24 hr tablet Commonly known as: TOPROL-XL Take 1 tablet (50 mg total) by mouth daily. Take with or immediately following a meal.   nitroGLYCERIN 0.4 MG SL tablet Commonly known as: NITROSTAT Place 1 tablet (0.4 mg total) under the tongue every 5 (five) minutes as needed for chest pain.   oxyCODONE-acetaminophen 5-325 MG tablet Commonly known as: Percocet Take 1 tablet by mouth every 6 (six) hours as needed for severe pain.   pantoprazole 40 MG tablet Commonly known as:  Protonix Take 1 tablet (40 mg total) by mouth 2 (two) times daily before a meal.   polyethylene glycol 17 g packet Commonly known as: MIRALAX / GLYCOLAX Take 17 g by mouth daily as needed for moderate constipation.   pravastatin 40 MG tablet Commonly known as: PRAVACHOL Take 1 tablet (40 mg total) by mouth every evening.   pregabalin 50 MG capsule Commonly known as: LYRICA Take 50 mg by mouth as needed (neuropathy).   prochlorperazine 10 MG tablet Commonly known as: COMPAZINE Take 1 tablet (10 mg total) by mouth every 6 (six) hours as needed for nausea or vomiting. What changed: when to take this   promethazine 12.5 MG tablet Commonly known as: PHENERGAN Take 12.5 mg by mouth as needed for nausea or vomiting. What changed: Another medication with the same name was changed. Make sure you understand how and when to take each.   promethazine 25 MG suppository Commonly known as: PHENERGAN Unwrap and place 1 suppository (25 mg total) rectally every 6 (six) hours as needed for nausea or vomiting. What changed: when to take this   scopolamine 1 MG/3DAYS Commonly known as: TRANSDERM-SCOP Place 1 patch (1.5 mg total) onto the skin every 3 (three) days.   tobramycin 0.3 % ophthalmic solution Commonly known as: TOBREX Place 1-2 drops into both eyes See admin instructions. Instill 1-2 drops into both eyes 3 times daily the day before, day of, and the day after every 6 weeks eye injections per patient               Durable Medical Equipment  (From admission, onward)           Start     Ordered   10/11/22 1541  For home use only DME Walker rolling  Once       Question Answer Comment  Walker: With [place]   Patient needs a walker to treat with the following condition Weakness   Patient needs a walker to treat with the following condition Pneumonia   Patient needs a walker to treat with the following condition Pleurisy      10/11/22 1541            Follow-up  Information     [facility], [place] on 10/14/2022.   Why: Schedule is Monday/Wednesday/Friday with 10:45 am chair time.  For first treatment, please arrive at 9:45 am to complete paperwork prior to treatment. Contact information: [place] [address] [place] Non-medicaid transportation. Call.   Why: A referral form was faxed in for you.  Please call them to see if you have been approved for transportation services to/from dialysis. Contact information: [phone]        [name], PA Follow up in 1 week(s).   Specialty: Internal Medicine Contact information: [address] [place] [address] [place] [address] [phone]                Allergies  Allergen Reactions   Atorvastatin Other (See Comments)    Dizziness    Dulaglutide Nausea And Vomiting and Other (See Comments)    Caused pancreatitis    Metoclopramide Other (See Comments)    Possible movement disorder or tardive dyskinesia while on reglan   Other Itching    Peas and carrots   Sertraline Nausea And Vomiting   Zofran [Ondansetron] Nausea And Vomiting and Other (See Comments)    Causes nausea vomiting to worsen / doesn't really work for patient.   Amoxicillin Itching and Rash   Lantus [Insulin Glargine] Itching and Rash    Has tolerated insulin glargine many times with no reported issues   Miconazole Nitrate Nausea And Vomiting and Rash    Consultations: None allergy, VVS and IR   Procedures/Studies: IR US Guide Vasc Access Right  Result Date: 10/08/2022 INDICATION: 52 year old female with history of acute on chronic kidney disease requiring initiation of hemodialysis. EXAM: TUNNELED CENTRAL VENOUS HEMODIALYSIS CATHETER PLACEMENT WITH ULTRASOUND AND FLUOROSCOPIC GUIDANCE MEDICATIONS: Vancomycin 1 gm IV . The antibiotic was given in an appropriate time interval prior to skin puncture. ANESTHESIA/SEDATION: Moderate  (conscious) sedation was employed during this procedure. A total of Versed 1 mg and Fentanyl 50 mcg was administered intravenously. Moderate Sedation Time: 10 minutes. The patient's level of consciousness and vital signs were monitored continuously by radiology nursing throughout the procedure under my direct supervision. FLUOROSCOPY TIME:  One mGy COMPLICATIONS: None immediate. PROCEDURE: Informed written consent was obtained from the patient after a discussion of the risks, benefits, and alternatives to treatment. Questions regarding the procedure were encouraged and answered. The right neck and chest were prepped with chlorhexidine in a sterile fashion, and a sterile drape was applied covering the operative field. Maximum barrier sterile technique with sterile gowns and gloves were used for the procedure. A timeout was performed prior to the initiation of the procedure. After creating a small venotomy incision, a 21 gauge micropuncture kit was utilized to access the internal jugular vein. Real-time ultrasound guidance was utilized for vascular access including the acquisition of a permanent ultrasound image documenting patency of the accessed vessel. A Rosen wire was advanced to the level of the IVC and the micropuncture sheath was exchanged for an 8 Fr dilator. A 14.5 French tunneled hemodialysis catheter measuring 19 cm from tip to cuff was tunneled in a retrograde fashion from the anterior chest wall to the venotomy incision. Serial dilation was then performed an a peel-away sheath was placed. The catheter was then placed through the peel-away sheath with the catheter tip ultimately positioned within the right atrium. Final catheter positioning was confirmed and documented with a spot radiographic image. The catheter aspirates and flushes normally. The catheter was flushed with appropriate volume heparin dwells. The catheter exit site was secured with a 0-Silk retention suture. The venotomy incision was closed  with Dermabond. Sterile dressings were applied. The patient tolerated the procedure well without immediate post procedural complication. IMPRESSION: Successful placement of 19 cm tip to cuff tunneled hemodialysis catheter via the right internal jugular vein with catheter tip terminating within the right  atrium. The catheter is ready for immediate use. Ruthann Cancer, MD [facility] Radiology Electronically Signed   By: Ruthann Cancer M.D.   On: 10/08/2022 08:53   DG Chest Port 1 View  Result Date: 10/08/2022 CLINICAL DATA:  Shortness of breath and cough. Status post dialysis catheter placement. EXAM: PORTABLE CHEST 1 VIEW COMPARISON:  Single-view of the chest 10/07/2022. FINDINGS: New right IJ approach dialysis catheter is in place. Its tip projects at the superior cavoatrial junction. No pneumothorax. There is cardiomegaly and vascular congestion. Edema seen on the prior examination has resolved. No pleural effusion. IMPRESSION: Dialysis catheter tip projects at the superior cavoatrial junction. Negative for pneumothorax. Cardiomegaly and mild vascular congestion. Aeration is improved compared to yesterday's exam. Electronically Signed   By: Inge Rise M.D.   On: 10/08/2022 08:22   IR Fluoro Guide CV Line Right  Result Date: 10/07/2022 INDICATION: 52 year old female with history of acute on chronic kidney disease requiring initiation of hemodialysis. EXAM: TUNNELED CENTRAL VENOUS HEMODIALYSIS CATHETER PLACEMENT WITH ULTRASOUND AND FLUOROSCOPIC GUIDANCE MEDICATIONS: Vancomycin 1 gm IV . The antibiotic was given in an appropriate time interval prior to skin puncture. ANESTHESIA/SEDATION: Moderate (conscious) sedation was employed during this procedure. A total of Versed 1 mg and Fentanyl 50 mcg was administered intravenously. Moderate Sedation Time: 10 minutes. The patient's level of consciousness and vital signs were monitored continuously by radiology  nursing throughout the procedure under my direct supervision. FLUOROSCOPY TIME:  One mGy COMPLICATIONS: None immediate. PROCEDURE: Informed written consent was obtained from the patient after a discussion of the risks, benefits, and alternatives to treatment. Questions regarding the procedure were encouraged and answered. The right neck and chest were prepped with chlorhexidine in a sterile fashion, and a sterile drape was applied covering the operative field. Maximum barrier sterile technique with sterile gowns and gloves were used for the procedure. A timeout was performed prior to the initiation of the procedure. After creating a small venotomy incision, a 21 gauge micropuncture kit was utilized to access the internal jugular vein. Real-time ultrasound guidance was utilized for vascular access including the acquisition of a permanent ultrasound image documenting patency of the accessed vessel. A Rosen wire was advanced to the level of the IVC and the micropuncture sheath was exchanged for an 8 Fr dilator. A 14.5 French tunneled hemodialysis catheter measuring 19 cm from tip to cuff was tunneled in a retrograde fashion from the anterior chest wall to the venotomy incision. Serial dilation was then performed an a peel-away sheath was placed. The catheter was then placed through the peel-away sheath with the catheter tip ultimately positioned within the right atrium. Final catheter positioning was confirmed and documented with a spot radiographic image. The catheter aspirates and flushes normally. The catheter was flushed with appropriate volume heparin dwells. The catheter exit site was secured with a 0-Silk retention suture. The venotomy incision was closed with Dermabond. Sterile dressings were applied. The patient tolerated the procedure well without immediate post procedural complication. IMPRESSION: Successful placement of 19 cm tip to cuff tunneled hemodialysis catheter via the right internal jugular vein  with catheter tip terminating within the right atrium. The catheter is ready for immediate use. Ruthann Cancer, MD [facility] Radiology Electronically Signed   By: Ruthann Cancer M.D.   On: 10/07/2022 16:05   DG Chest Port 1 View  Result Date: 10/07/2022 CLINICAL DATA:  Shortness of breath EXAM: PORTABLE CHEST 1 VIEW COMPARISON:  Three days  ago FINDINGS: Cardiomegaly and vascular pedicle widening with diffuse interstitial opacity. No air bronchogram. No pneumothorax. ICD lead into the right ventricle. Artifact from EKG leads. IMPRESSION: Suspect mild CHF. Electronically Signed   By: Jorje Guild M.D.   On: 10/07/2022 06:37   DG Abd Portable 1V  Result Date: 10/07/2022 CLINICAL DATA:  Nausea and vomiting EXAM: PORTABLE ABDOMEN - 1 VIEW COMPARISON:  Abdominal CT from 2 days ago FINDINGS: The bowel gas pattern is normal. No radio-opaque calculi or other significant radiographic abnormality are seen. IMPRESSION: Normal bowel gas pattern. Electronically Signed   By: Jorje Guild M.D.   On: 10/07/2022 06:36   VAS US DUPLEX DIALYSIS ACCESS (AVF, AVG)  Result Date: 10/06/2022 DIALYSIS ACCESS Patient Name:  [name]  Date of Exam:   10/06/2022 Medical Rec #: [id number]       Accession #:    [id number] Date of Birth: 1970/10/09      Patient Gender: F Patient Age:   59 years Exam Location:  [facility] Procedure:      VAS US DUPLEX DIALYSIS ACCESS (AVF, AVG) Referring Phys: Lawson Radar --------------------------------------------------------------------------------  Reason for Exam: No palpable thrill for AVF/AVG. Access Site: Left Upper Extremity. Access Type: Brachio-basilic, status post first stage 10/01/22. Comparison Study: No prior study Performing Technologist: Sharion Dove RVS  Examination Guidelines: A complete evaluation includes B-mode imaging, spectral Doppler, color Doppler, and power Doppler as needed of all accessible portions of each  vessel. Unilateral testing is considered an integral part of a complete examination. Limited examinations for reoccurring indications may be performed as noted.    Summary: Brachiobasilic AVF appears thrombosed  *See table(s) above for measurements and observations.  Diagnosing physician: Monica Martinez MD Electronically signed by Monica Martinez MD on 10/06/2022 at 1:02:35 PM.   --------------------------------------------------------------------------------   Final    ECHOCARDIOGRAM COMPLETE  Result Date: 10/05/2022    ECHOCARDIOGRAM REPORT   Patient Name:   [name] Date of Exam: 10/05/2022 Medical Rec #:  [id number]      Height:       72.0 in Accession #:    [id number]     Weight:       179.5 lb Date of Birth:  Apr 10, 1971     BSA:          2.035 m Patient Age:    76 years       BP:           120/72 mmHg Patient Gender: F              HR:           94 bpm. Exam Location:  Inpatient Procedure: 2D Echo, Cardiac Doppler and Color Doppler Indications:    Chest Pain R07.9  History:        Patient has no prior history of Echocardiogram examinations.                 CHF, CAD and Previous Myocardial Infarction; Risk                 Factors:Diabetes and Hypertension.  Sonographer:    Luane School RDCS Referring Phys: [id number] [place]  1. Left ventricular ejection fraction, by estimation, is 40 to 45%. The left ventricle has mildly decreased function. The left ventricle demonstrates global hypokinesis. There is mild concentric left ventricular hypertrophy. Left ventricular diastolic parameters are consistent with Grade III diastolic dysfunction (restrictive). Elevated left atrial pressure.  2. Right ventricular systolic function is  mildly reduced. The right ventricular size is moderately enlarged. There is moderately elevated pulmonary artery systolic pressure.  3. Left atrial size was moderately dilated.  4. Right atrial size was moderately dilated.  5. The mitral valve is normal in structure. No  evidence of mitral valve regurgitation. No evidence of mitral stenosis.  6. Tricuspid valve regurgitation is moderate to severe.  7. The aortic valve is tricuspid. Aortic valve regurgitation is not visualized. No aortic stenosis is present.  8. The inferior vena cava is dilated in size with <50% respiratory variability, suggesting right atrial pressure of 15 mmHg. Comparison(s): Prior images reviewed side by side. The left ventricular function is significantly worse. The right ventricular systolic function is worse. TR severity and PA hypertension are also worse, with evidence of hypervolemia. FINDINGS  Left Ventricle: Left ventricular ejection fraction, by estimation, is 40 to 45%. The left ventricle has mildly decreased function. The left ventricle demonstrates global hypokinesis. The left ventricular internal cavity size was normal in size. There is  mild concentric left ventricular hypertrophy. Left ventricular diastolic parameters are consistent with Grade III diastolic dysfunction (restrictive). Elevated left atrial pressure. Right Ventricle: The right ventricular size is moderately enlarged. No increase in right ventricular wall thickness. Right ventricular systolic function is mildly reduced. There is moderately elevated pulmonary artery systolic pressure. The tricuspid regurgitant velocity is 3.05 m/s, and with an assumed right atrial pressure of 15 mmHg, the estimated right ventricular systolic pressure is [id number]. Left Atrium: Left atrial size was moderately dilated. Right Atrium: Right atrial size was moderately dilated. Pericardium: Trivial pericardial effusion is present. Mitral Valve: The mitral valve is normal in structure. There is mild thickening of the mitral valve leaflet(s). Mild mitral annular calcification. No evidence of mitral valve regurgitation. No evidence of mitral valve stenosis. Tricuspid Valve: The tricuspid valve is normal in structure. Tricuspid valve regurgitation is moderate to  severe. Aortic Valve: The aortic valve is tricuspid. Aortic valve regurgitation is not visualized. No aortic stenosis is present. Pulmonic Valve: The pulmonic valve was normal in structure. Pulmonic valve regurgitation is trivial. Aorta: The aortic root and ascending aorta are structurally normal, with no evidence of dilitation. Venous: The inferior vena cava is dilated in size with less than 50% respiratory variability, suggesting right atrial pressure of 15 mmHg. IAS/Shunts: No atrial level shunt detected by color flow Doppler. Additional Comments: A device lead is visualized in the right ventricle. There is pleural effusion in the left lateral region.  LEFT VENTRICLE PLAX 2D LVIDd:         4.70 cm     Diastology LVIDs:         3.80 cm     LV e' medial:    6.53 cm/s LV PW:         1.30 cm     LV E/e' medial:  16.1 LV IVS:        1.14 cm     LV e' lateral:   8.05 cm/s LVOT diam:     2.00 cm     LV E/e' lateral: 13.0 LV SV:         43 LV SV Index:   21 LVOT Area:     3.14 cm  LV Volumes (MOD) LV vol d, MOD A2C: 85.9 ml LV vol d, MOD A4C: 78.0 ml LV vol s, MOD A2C: 54.3 ml LV vol s, MOD A4C: 53.8 ml LV SV MOD A2C:     31.6 ml LV SV MOD A4C:  78.0 ml LV SV MOD BP:      31.4 ml RIGHT VENTRICLE            IVC RV S prime:     9.90 cm/s  IVC diam: 2.40 cm TAPSE (M-mode): 1.7 cm LEFT ATRIUM           Index        RIGHT ATRIUM           Index LA diam:      4.67 cm 2.30 cm/m   RA Area:     19.40 cm LA Vol (A2C): 82.5 ml 40.55 ml/m  RA Volume:   58.20 ml  28.60 ml/m LA Vol (A4C): 66.5 ml 32.68 ml/m  AORTIC VALVE LVOT Vmax:   77.50 cm/s LVOT Vmean:  58.000 cm/s LVOT VTI:    0.138 m  AORTA Ao Root diam: 3.40 cm Ao Asc diam:  3.30 cm Ao Desc diam: 2.40 cm MITRAL VALVE                TRICUSPID VALVE MV Area (PHT): 6.96 cm     TR Peak grad:   37.2 mmHg MV Decel Time: 109 msec     TR Vmax:        305.00 cm/s MV E velocity: 105.00 cm/s MV A velocity: 41.60 cm/s   SHUNTS MV E/A ratio:  2.52         Systemic VTI:  0.14 m                              Systemic Diam: 2.00 cm Dani Gobble Croitoru MD Electronically signed by Sanda Klein MD Signature Date/Time: 10/05/2022/2:53:24 PM    Final    CT ABDOMEN PELVIS WO CONTRAST  Result Date: 10/05/2022 CLINICAL DATA:  Abdominal pain, vomiting EXAM: CT ABDOMEN AND PELVIS WITHOUT CONTRAST TECHNIQUE: Multidetector CT imaging of the abdomen and pelvis was performed following the standard protocol without IV contrast. RADIATION DOSE REDUCTION: This exam was performed according to the departmental dose-optimization program which includes automated exposure control, adjustment of the mA and/or kV according to patient size and/or use of iterative reconstruction technique. COMPARISON:  05/23/2022 FINDINGS: Lower chest: Moderate bilateral pleural effusions with associated compressive atelectasis in the bilateral lower lobes. Additional patchy anterior right lower lobe opacity, possibly reflecting pneumonia, less likely atelectasis. Mild lingular and left lower lobe atelectasis. Hepatobiliary: Unenhanced liver is unremarkable. Gallbladder is unremarkable. No intrahepatic or extrahepatic duct dilatation. Pancreas: Within normal limits. Spleen: Within normal limits. Adrenals/Urinary Tract: Adrenal glands are within normal limits. Kidneys are within normal limits. No renal, ureteral, or bladder calculi. No hydronephrosis. Mildly thick-walled bladder. Stomach/Bowel: Stomach is within normal limits. No evidence of bowel obstruction. Normal appendix (series 3/image 66). No colonic wall thickening or inflammatory changes. Vascular/Lymphatic: No evidence of abdominal aortic aneurysm. No suspicious abdominopelvic lymphadenopathy. Reproductive: Uterus is within normal limits. Bilateral ovaries are within normal limits. Other: Trace pelvic ascites. Musculoskeletal: Visualized osseous structures are within normal limits. IMPRESSION: No evidence of bowel obstruction. Normal appendix. No CT findings to account for the  patient's abdominal pain. Mildly thick-walled bladder, correlate for cystitis. Moderate bilateral pleural effusions with associated compressive atelectasis in the bilateral lower lobes. Additional patchy anterior right lower lobe opacity, possibly reflecting pneumonia, less likely atelectasis. Electronically Signed   By: Julian Hy M.D.   On: 10/05/2022 01:13   DG Chest 2 View  Result Date: 10/04/2022 CLINICAL DATA:  Shortness of breath EXAM:  CHEST - 2 VIEW COMPARISON:  CXR 09/01/22 FINDINGS: Left-sided single lead cardiac device in place with likely unchanged lead positioning when accounting for differences in positioning. No pleural effusion. No pneumothorax. Unchanged slightly enlarged cardiac contours. Unchanged mediastinal contours. Compared to prior exam there are new bilateral perihilar and bibasilar airspace opacities, which could represent atypical infection or pulmonary edema. No radiographically apparent displaced rib fractures. Vertebral body heights are maintained. IMPRESSION: Compared to prior exam there are new bilateral perihilar and bibasilar airspace opacities, which could represent atypical infection or pulmonary edema. Electronically Signed   By: Marin Roberts M.D.   On: 10/04/2022 10:20     Discharge Exam: Vitals:   10/15/22 0300 10/15/22 0757  BP: (!) 127/58 (!) 147/65  Pulse: 78 77  Resp: (!) 21 18  Temp: 98.7 F (37.1 C) 97.6 F (36.4 C)  SpO2: 96% 98%   Vitals:   10/14/22 1953 10/14/22 2321 10/15/22 0300 10/15/22 0757  BP: (!) 148/67 (!) 114/55 (!) 127/58 (!) 147/65  Pulse: 78 78 78 77  Resp: 20 (!) 21 (!) 21 18  Temp: 98.7 F (37.1 C) 98.8 F (37.1 C) 98.7 F (37.1 C) 97.6 F (36.4 C)  TempSrc: Oral Oral Oral Oral  SpO2: 92% 95% 96% 98%  Weight:      Height:        General: Pt is alert, awake, not in acute distress Cardiovascular: RRR, S1/S2 +, no rubs, no gallops Respiratory: CTA bilaterally, no wheezing, no rhonchi Abdominal: Soft, NT, ND, bowel  sounds + Extremities: no edema, no cyanosis    The results of significant diagnostics from this hospitalization (including imaging, microbiology, ancillary and laboratory) are listed below for reference.     Microbiology: Recent Results (from the past 240 hour(s))  Culture, blood (Routine X 2) w Reflex to ID Panel     Status: None   Collection Time: 10/07/22  9:11 PM   Specimen: BLOOD  Result Value Ref Range Status   Specimen Description BLOOD SITE NOT SPECIFIED  Final   Special Requests   Final    BOTTLES DRAWN AEROBIC AND ANAEROBIC Blood Culture adequate volume   Culture   Final    NO GROWTH 5 DAYS Performed at [facility], [address] [place], [place] [address]    Report Status 10/12/2022 FINAL  Final  Culture, blood (Routine X 2) w Reflex to ID Panel     Status: None   Collection Time: 10/07/22  9:11 PM   Specimen: BLOOD  Result Value Ref Range Status   Specimen Description BLOOD SITE NOT SPECIFIED  Final   Special Requests   Final    BOTTLES DRAWN AEROBIC AND ANAEROBIC Blood Culture adequate volume   Culture   Final    NO GROWTH 5 DAYS Performed at [facility], [place] [address], [place], [place] [address]    Report Status 10/12/2022 FINAL  Final  MRSA Next Gen by PCR, Nasal     Status: None   Collection Time: 10/08/22  7:41 AM   Specimen: Nasal Mucosa; Nasal Swab  Result Value Ref Range Status   MRSA by PCR Next Gen NOT DETECTED NOT DETECTED Final    Comment: (NOTE) The GeneXpert MRSA Assay (FDA approved for NASAL specimens only), is one component of a comprehensive MRSA colonization surveillance program. It is not intended to diagnose MRSA infection nor to guide or monitor treatment for MRSA infections. Test performance is not FDA approved in patients less than 2  years old. Performed at [facility], [place] [address] [place], [place] [address]   Culture, blood (Routine X 2) w Reflex to ID Panel     Status: None   Collection Time: 10/08/22  10:47 AM   Specimen: BLOOD RIGHT HAND  Result Value Ref Range Status   Specimen Description BLOOD RIGHT HAND  Final   Special Requests   Final    BOTTLES DRAWN AEROBIC AND ANAEROBIC Blood Culture adequate volume   Culture   Final    NO GROWTH 5 DAYS Performed at [facility], [place] [address] [place], [place] [address]    Report Status 10/13/2022 FINAL  Final  Culture, blood (Routine X 2) w Reflex to ID Panel     Status: None   Collection Time: 10/08/22  4:27 PM   Specimen: BLOOD  Result Value Ref Range Status   Specimen Description BLOOD RIGHT ANTECUBITAL  Final   Special Requests   Final    BOTTLES DRAWN AEROBIC AND ANAEROBIC Blood Culture adequate volume   Culture   Final    NO GROWTH 5 DAYS Performed at [facility], [place] [address], [place], [place] [address]    Report Status 10/13/2022 FINAL  Final     Labs: BNP (last 3 results) Recent Labs    10/04/22 1000 10/08/22 0606 10/09/22 0602  BNP 1,496.7* 2,182.7* 123456*   Basic Metabolic Panel: Recent Labs  Lab 10/09/22 0602 10/10/22 0543 10/11/22 0818 10/12/22 0329 10/14/22 0805 10/15/22 0637  NA 134* 136 137 137 138 136  K 3.5 3.5 3.3* 3.1* 3.5 3.8  CL 97* 100 100 99 100 99  CO2 '23 23 26 28 26 24  '$ GLUCOSE 123* 249* 282* 130* 202* 263*  BUN 62* 42* 66* 35* 56* 37*  CREATININE 6.21* 4.51* 5.26* 3.63* 5.21* 4.17*  CALCIUM 7.7* 7.7* 7.6* 7.4* 8.1* 7.7*  MG 1.8 1.8 1.7  --   --   --   PHOS  --   --   --   --  5.0* 4.3   Liver Function Tests: Recent Labs  Lab 10/09/22 0602 10/10/22 0543 10/11/22 0818 10/12/22 0329 10/14/22 0805 10/15/22 0637  AST 68* 35 23 23  --   --   ALT 84* 67* 46* 36  --   --   ALKPHOS 234* 216* 167* 169*  --   --   BILITOT 0.8 0.9 0.6 0.6  --   --   PROT 4.9* 5.5* 5.1* 5.2*  --   --   ALBUMIN 2.1* 2.3* 2.1* 2.1* 2.2* 2.2*   No results for input(s): "LIPASE", "AMYLASE" in the last 168 hours. No results for input(s): "AMMONIA" in the last 168 hours. CBC: Recent Labs   Lab 10/08/22 1627 10/09/22 0602 10/10/22 0543 10/11/22 0818 10/12/22 0329 10/14/22 0805 10/15/22 0637  WBC 6.1 5.5 5.9 8.5 9.9 9.7 9.8  NEUTROABS 4.8 3.9 5.0 7.6 6.5  --   --   HGB 7.0* 6.4* 8.5* 7.9* 8.2* 8.4* 8.2*  HCT 20.7* 20.5* 25.9* 24.5* 24.7* 26.2* 25.8*  MCV 87.0 88.7 87.2 89.7 87.6 89.4 89.3  PLT 195 181 202 240 229 340 307   Cardiac Enzymes: No results for input(s): "CKTOTAL", "CKMB", "CKMBINDEX", "TROPONINI" in the last 168 hours. BNP: Invalid input(s): "POCBNP" CBG: Recent Labs  Lab 10/13/22 2029 10/14/22 1345 10/14/22 1610 10/14/22 2209 10/15/22 0756  GLUCAP 220* 136* 149* 261* 246*   D-Dimer No results for input(s): "DDIMER" in the last 72 hours.  Hgb A1c No results for input(s): "HGBA1C" in the last 72 hours. Lipid Profile No results for input(s): "CHOL", "HDL", "LDLCALC", "TRIG", "CHOLHDL", "LDLDIRECT" in the last 72 hours. Thyroid function studies No results for input(s): "TSH", "T4TOTAL", "T3FREE", "THYROIDAB" in the last 72 hours.  Invalid input(s): "FREET3" Anemia work up No results for input(s): "VITAMINB12", "FOLATE", "FERRITIN", "TIBC", "IRON", "RETICCTPCT" in the last 72 hours. Urinalysis    Component Value Date/Time   COLORURINE STRAW (A) 10/07/2022 1425   APPEARANCEUR CLEAR 10/07/2022 1425   LABSPEC 1.008 10/07/2022 1425   PHURINE 5.0 10/07/2022 1425   GLUCOSEU NEGATIVE 10/07/2022 1425   HGBUR SMALL (A) 10/07/2022 1425   BILIRUBINUR NEGATIVE 10/07/2022 1425   BILIRUBINUR negative 08/30/2020 1631   KETONESUR NEGATIVE 10/07/2022 1425   PROTEINUR 100 (A) 10/07/2022 1425   UROBILINOGEN 0.2 08/30/2020 1631   UROBILINOGEN 1.0 08/11/2008 1745   NITRITE NEGATIVE 10/07/2022 1425   LEUKOCYTESUR NEGATIVE 10/07/2022 1425   Sepsis Labs Recent Labs  Lab 10/11/22 0818 10/12/22 0329 10/14/22 0805 10/15/22 0637  WBC 8.5 9.9 9.7 9.8   Microbiology Recent Results (from the past 240 hour(s))  Culture, blood (Routine X 2) w Reflex to ID  Panel     Status: None   Collection Time: 10/07/22  9:11 PM   Specimen: BLOOD  Result Value Ref Range Status   Specimen Description BLOOD SITE NOT SPECIFIED  Final   Special Requests   Final    BOTTLES DRAWN AEROBIC AND ANAEROBIC Blood Culture adequate volume   Culture   Final    NO GROWTH 5 DAYS Performed at [facility], [place] [address] [place], [place] [address]    Report Status 10/12/2022 FINAL  Final  Culture, blood (Routine X 2) w Reflex to ID Panel     Status: None   Collection Time: 10/07/22  9:11 PM   Specimen: BLOOD  Result Value Ref Range Status   Specimen Description BLOOD SITE NOT SPECIFIED  Final   Special Requests   Final    BOTTLES DRAWN AEROBIC AND ANAEROBIC Blood Culture adequate volume   Culture   Final    NO GROWTH 5 DAYS Performed at [facility], [place] [address], [place], [place] [address]    Report Status 10/12/2022 FINAL  Final  MRSA Next Gen by PCR, Nasal     Status: None   Collection Time: 10/08/22  7:41 AM   Specimen: Nasal Mucosa; Nasal Swab  Result Value Ref Range Status   MRSA by PCR Next Gen NOT DETECTED NOT DETECTED Final    Comment: (NOTE) The GeneXpert MRSA Assay (FDA approved for NASAL specimens only), is one component of a comprehensive MRSA colonization surveillance program. It is not intended to diagnose MRSA infection nor to guide or monitor treatment for MRSA infections. Test performance is not FDA approved in patients less than 2 years old. Performed at [facility], [place] [address] [place], [place] [address]   Culture, blood (Routine X 2) w Reflex to ID Panel     Status: None   Collection Time: 10/08/22 10:47 AM   Specimen: BLOOD RIGHT HAND  Result Value Ref Range Status   Specimen Description BLOOD RIGHT HAND  Final   Special Requests   Final    BOTTLES DRAWN AEROBIC AND ANAEROBIC Blood Culture adequate volume   Culture   Final    NO GROWTH 5 DAYS Performed at [facility], [place] [address],  [place], [place] [address]  Report Status 10/13/2022 FINAL  Final  Culture, blood (Routine X 2) w Reflex to ID Panel     Status: None   Collection Time: 10/08/22  4:27 PM   Specimen: BLOOD  Result Value Ref Range Status   Specimen Description BLOOD RIGHT ANTECUBITAL  Final   Special Requests   Final    BOTTLES DRAWN AEROBIC AND ANAEROBIC Blood Culture adequate volume   Culture   Final    NO GROWTH 5 DAYS Performed at [facility], [address] [place], [place] [address]    Report Status 10/13/2022 FINAL  Final     Time coordinating discharge: Over 30 minutes  SIGNED:   Darliss Cheney, MD  [facility] 10/15/2022, 9:37 AM *Please note that this is a verbal dictation therefore any spelling or grammatical errors are due to the "[place] [employer]" system interpretation. If 7PM-7AM, please contact night-coverage [email or web address]

## 2022-10-15 NOTE — Plan of Care (Signed)
  Problem: Education: Goal: Knowledge of disease and its progression will improve Outcome: Adequate for Discharge   Problem: Fluid Volume: Goal: Compliance with measures to maintain balanced fluid volume will improve Outcome: Adequate for Discharge   Problem: Health Behavior/Discharge Planning: Goal: Ability to manage health-related needs will improve Outcome: Adequate for Discharge   Problem: Nutritional: Goal: Ability to make healthy dietary choices will improve Outcome: Adequate for Discharge   Problem: Clinical Measurements: Goal: Complications related to the disease process, condition or treatment will be avoided or minimized Outcome: Adequate for Discharge   

## 2022-10-15 NOTE — TOC Progression Note (Signed)
Transition of Care [facility]) - Progression Note    Patient Details  Name: [name] MRN: [id number] Date of Birth: 03/01/1971  Transition of Care [facility] CM/SW Contact  Levonne Lapping, RN Phone Number: 10/15/2022, 12:52 PM  Clinical Narrative:     Rolling walker and bedside commode to be delivered to room by [employer] before DC  Patient will dc to home. Outpatient PT and OT referrals have been made because Home Health copay was too high . Patient will go to [facility] OP on [address] [employer].   Patients boyfriend to transport home   No additional TOC needs     Expected Discharge Plan: [place] (HD on M/W/F) Barriers to Discharge: (P) No Barriers Identified  Expected Discharge Plan and Services   Discharge Planning Services: CM Consult Post Acute Care Choice: [place] arrangements for the past 2 months: Single Family Home Expected Discharge Date: 10/15/22               DME Arranged: (P) Bedside commode, Walker rolling DME Agency: (P) [employer] Date DME Agency Contacted: (P) 10/15/22 Time DME Agency Contacted: (P) 1211 (n) Representative spoke with at DME Agency: (P) [place] HH Arranged: (P) NA [place]: (P) NA Date Icard: 10/11/22 Time [place]: 1453 Representative spoke with at [place]: [place] ([place]) Interventions [place]: No Food Insecurity (10/04/2022)  Housing: [place]  (10/04/2022)  Transportation Needs: No Transportation Needs (10/04/2022)  Utilities: Not At Risk (10/04/2022)  Alcohol Screen: Low Risk  (07/15/2022)  Depression (PHQ2-9): Low Risk  (06/12/2020)  Financial Resource Strain: Low Risk  (07/15/2022)  Tobacco Use: Low Risk  (10/14/2022)    Readmission Risk Interventions    10/09/2022    3:25 PM 09/10/2022   10:49 AM 07/17/2022    9:51 AM  Readmission Risk Prevention Plan  Transportation Screening Complete Complete Complete  Medication  Review Press photographer) Complete Complete Complete  PCP or Specialist appointment within 3-5 days of discharge Complete Complete Complete  HRI or [place] Complete Complete Complete  SW Recovery Care/Counseling Consult Complete Complete   Palliative Care Screening Not Applicable Not Applicable Not [place] Not Applicable Not Applicable Not Applicable

## 2022-10-16 ENCOUNTER — Telehealth: Payer: Self-pay | Admitting: Vascular Surgery

## 2022-10-16 DIAGNOSIS — Z992 Dependence on renal dialysis: Secondary | ICD-10-CM | POA: Diagnosis not present

## 2022-10-16 DIAGNOSIS — R0602 Shortness of breath: Secondary | ICD-10-CM | POA: Diagnosis not present

## 2022-10-16 DIAGNOSIS — R079 Chest pain, unspecified: Secondary | ICD-10-CM | POA: Diagnosis not present

## 2022-10-16 DIAGNOSIS — N186 End stage renal disease: Secondary | ICD-10-CM | POA: Diagnosis not present

## 2022-10-16 NOTE — Telephone Encounter (Signed)
Spoke with patient she stated that she wants to hold off in scheduling appt at this time.

## 2022-10-16 NOTE — Telephone Encounter (Signed)
-----   Message from Rosetta Posner, MD sent at 10/01/2022  1:06 PM EST -----  Left first stage brachiobasilic fistula creation.  I need to see her in the office in 4 to 6 weeks and she does need a duplex at that same time.

## 2022-10-18 ENCOUNTER — Ambulatory Visit: Payer: BC Managed Care – PPO | Admitting: Physician Assistant

## 2022-10-18 ENCOUNTER — Encounter (HOSPITAL_COMMUNITY): Payer: BC Managed Care – PPO

## 2022-10-18 DIAGNOSIS — N186 End stage renal disease: Secondary | ICD-10-CM | POA: Diagnosis not present

## 2022-10-18 DIAGNOSIS — Z992 Dependence on renal dialysis: Secondary | ICD-10-CM | POA: Diagnosis not present

## 2022-10-21 ENCOUNTER — Observation Stay (HOSPITAL_COMMUNITY): Payer: BC Managed Care – PPO

## 2022-10-21 ENCOUNTER — Other Ambulatory Visit: Payer: Self-pay

## 2022-10-21 ENCOUNTER — Encounter (HOSPITAL_BASED_OUTPATIENT_CLINIC_OR_DEPARTMENT_OTHER): Payer: Self-pay | Admitting: Emergency Medicine

## 2022-10-21 ENCOUNTER — Emergency Department (HOSPITAL_BASED_OUTPATIENT_CLINIC_OR_DEPARTMENT_OTHER): Payer: BC Managed Care – PPO

## 2022-10-21 ENCOUNTER — Observation Stay (HOSPITAL_BASED_OUTPATIENT_CLINIC_OR_DEPARTMENT_OTHER)
Admission: EM | Admit: 2022-10-21 | Discharge: 2022-10-23 | Disposition: A | Discharge status: Home / Self Care | Payer: BC Managed Care – PPO | Attending: Internal Medicine | Admitting: Internal Medicine

## 2022-10-21 DIAGNOSIS — E1343 Other specified diabetes mellitus with diabetic autonomic (poly)neuropathy: Secondary | ICD-10-CM

## 2022-10-21 DIAGNOSIS — Z794 Long term (current) use of insulin: Secondary | ICD-10-CM | POA: Insufficient documentation

## 2022-10-21 DIAGNOSIS — J45909 Unspecified asthma, uncomplicated: Secondary | ICD-10-CM | POA: Diagnosis not present

## 2022-10-21 DIAGNOSIS — Z79899 Other long term (current) drug therapy: Secondary | ICD-10-CM | POA: Insufficient documentation

## 2022-10-21 DIAGNOSIS — R1084 Generalized abdominal pain: Secondary | ICD-10-CM | POA: Diagnosis not present

## 2022-10-21 DIAGNOSIS — Z7982 Long term (current) use of aspirin: Secondary | ICD-10-CM | POA: Diagnosis not present

## 2022-10-21 DIAGNOSIS — K3184 Gastroparesis: Secondary | ICD-10-CM | POA: Diagnosis not present

## 2022-10-21 DIAGNOSIS — E1043 Type 1 diabetes mellitus with diabetic autonomic (poly)neuropathy: Secondary | ICD-10-CM | POA: Diagnosis not present

## 2022-10-21 DIAGNOSIS — I160 Hypertensive urgency: Secondary | ICD-10-CM | POA: Insufficient documentation

## 2022-10-21 DIAGNOSIS — I2510 Atherosclerotic heart disease of native coronary artery without angina pectoris: Secondary | ICD-10-CM | POA: Insufficient documentation

## 2022-10-21 DIAGNOSIS — N186 End stage renal disease: Secondary | ICD-10-CM | POA: Insufficient documentation

## 2022-10-21 DIAGNOSIS — Z992 Dependence on renal dialysis: Secondary | ICD-10-CM | POA: Insufficient documentation

## 2022-10-21 DIAGNOSIS — R69 Illness, unspecified: Secondary | ICD-10-CM

## 2022-10-21 DIAGNOSIS — R160 Hepatomegaly, not elsewhere classified: Secondary | ICD-10-CM | POA: Diagnosis not present

## 2022-10-21 DIAGNOSIS — D631 Anemia in chronic kidney disease: Secondary | ICD-10-CM | POA: Insufficient documentation

## 2022-10-21 DIAGNOSIS — E1022 Type 1 diabetes mellitus with diabetic chronic kidney disease: Secondary | ICD-10-CM | POA: Diagnosis not present

## 2022-10-21 DIAGNOSIS — I509 Heart failure, unspecified: Secondary | ICD-10-CM | POA: Diagnosis not present

## 2022-10-21 DIAGNOSIS — R0902 Hypoxemia: Secondary | ICD-10-CM | POA: Diagnosis not present

## 2022-10-21 DIAGNOSIS — R748 Abnormal levels of other serum enzymes: Secondary | ICD-10-CM | POA: Insufficient documentation

## 2022-10-21 DIAGNOSIS — R112 Nausea with vomiting, unspecified: Secondary | ICD-10-CM | POA: Diagnosis not present

## 2022-10-21 DIAGNOSIS — I132 Hypertensive heart and chronic kidney disease with heart failure and with stage 5 chronic kidney disease, or end stage renal disease: Secondary | ICD-10-CM | POA: Diagnosis not present

## 2022-10-21 DIAGNOSIS — J90 Pleural effusion, not elsewhere classified: Secondary | ICD-10-CM | POA: Diagnosis not present

## 2022-10-21 HISTORY — DX: Dependence on renal dialysis: N18.6

## 2022-10-21 HISTORY — DX: End stage renal disease: N18.6

## 2022-10-21 HISTORY — DX: Dependence on renal dialysis: Z99.2

## 2022-10-21 LAB — COMPREHENSIVE METABOLIC PANEL
ALT: 12 U/L (ref 0–44)
ALT: 14 U/L (ref 0–44)
AST: 13 U/L — ABNORMAL LOW (ref 15–41)
AST: 15 U/L (ref 15–41)
Albumin: 4.1 g/dL (ref 3.5–5.0)
Albumin: 2.8 g/dL — ABNORMAL LOW (ref 3.5–5.0)
Alkaline Phosphatase: 129 U/L — ABNORMAL HIGH (ref 38–126)
Alkaline Phosphatase: 93 U/L (ref 38–126)
Anion gap: 11 (ref 5–15)
Anion gap: 8 (ref 5–15)
BUN: 22 mg/dL — ABNORMAL HIGH (ref 6–20)
BUN: 21 mg/dL — ABNORMAL HIGH (ref 6–20)
CO2: 26 mmol/L (ref 22–32)
CO2: 27 mmol/L (ref 22–32)
Calcium: 9.9 mg/dL (ref 8.9–10.3)
Calcium: 9 mg/dL (ref 8.9–10.3)
Chloride: 104 mmol/L (ref 98–111)
Chloride: 108 mmol/L (ref 98–111)
Creatinine, Ser: 3.39 mg/dL — ABNORMAL HIGH (ref 0.44–1.00)
Creatinine, Ser: 3.67 mg/dL — ABNORMAL HIGH (ref 0.44–1.00)
GFR, Estimated: 16 mL/min — ABNORMAL LOW (ref >60)
GFR, Estimated: 14 mL/min — ABNORMAL LOW (ref >60)
Glucose, Bld: 217 mg/dL — ABNORMAL HIGH (ref 70–99)
Glucose, Bld: 107 mg/dL — ABNORMAL HIGH (ref 70–99)
Potassium: 4.3 mmol/L (ref 3.5–5.1)
Potassium: 4 mmol/L (ref 3.5–5.1)
Sodium: 141 mmol/L (ref 135–145)
Sodium: 143 mmol/L (ref 135–145)
Total Bilirubin: 1.2 mg/dL (ref 0.3–1.2)
Total Bilirubin: 1.5 mg/dL — ABNORMAL HIGH (ref 0.3–1.2)
Total Protein: 8.1 g/dL (ref 6.5–8.1)
Total Protein: 6.5 g/dL (ref 6.5–8.1)

## 2022-10-21 LAB — CBC WITH DIFFERENTIAL/PLATELET
Abs Immature Granulocytes: 0.03 10*3/uL (ref 0.00–0.07)
Basophils Absolute: 0.1 10*3/uL (ref 0.0–0.1)
Basophils Relative: 1 %
Eosinophils Absolute: 0 10*3/uL (ref 0.0–0.5)
Eosinophils Relative: 0 %
HCT: 34.3 % — ABNORMAL LOW (ref 36.0–46.0)
Hemoglobin: 10.7 g/dL — ABNORMAL LOW (ref 12.0–15.0)
Immature Granulocytes: 0 %
Lymphocytes Relative: 11 %
Lymphs Abs: 1.1 10*3/uL (ref 0.7–4.0)
MCH: 27.8 pg (ref 26.0–34.0)
MCHC: 31.2 g/dL (ref 30.0–36.0)
MCV: 89.1 fL (ref 80.0–100.0)
Monocytes Absolute: 0.1 10*3/uL (ref 0.1–1.0)
Monocytes Relative: 1 %
Neutro Abs: 8.3 10*3/uL — ABNORMAL HIGH (ref 1.7–7.7)
Neutrophils Relative %: 87 %
Platelets: 319 10*3/uL (ref 150–400)
RBC: 3.85 MIL/uL — ABNORMAL LOW (ref 3.87–5.11)
RDW: 15.8 % — ABNORMAL HIGH (ref 11.5–15.5)
WBC: 9.7 10*3/uL (ref 4.0–10.5)
nRBC: 0 % (ref 0.0–0.2)

## 2022-10-21 LAB — TSH: TSH: 1.587 u[IU]/mL (ref 0.350–4.500)

## 2022-10-21 LAB — URINALYSIS, ROUTINE W REFLEX MICROSCOPIC
Bacteria, UA: NONE SEEN
Bilirubin Urine: NEGATIVE
Glucose, UA: 250 mg/dL — AB
Ketones, ur: NEGATIVE mg/dL
Leukocytes,Ua: NEGATIVE
Nitrite: NEGATIVE
Protein, ur: >300 mg/dL — AB
Specific Gravity, Urine: 1.013 (ref 1.005–1.030)
pH: 8.5 — ABNORMAL HIGH (ref 5.0–8.0)

## 2022-10-21 LAB — CBC
HCT: 30.1 % — ABNORMAL LOW (ref 36.0–46.0)
Hemoglobin: 9.7 g/dL — ABNORMAL LOW (ref 12.0–15.0)
MCH: 28.4 pg (ref 26.0–34.0)
MCHC: 32.2 g/dL (ref 30.0–36.0)
MCV: 88 fL (ref 80.0–100.0)
Platelets: 267 10*3/uL (ref 150–400)
RBC: 3.42 MIL/uL — ABNORMAL LOW (ref 3.87–5.11)
RDW: 15.8 % — ABNORMAL HIGH (ref 11.5–15.5)
WBC: 7.4 10*3/uL (ref 4.0–10.5)
nRBC: 0 % (ref 0.0–0.2)

## 2022-10-21 LAB — MAGNESIUM: Magnesium: 1.6 mg/dL — ABNORMAL LOW (ref 1.7–2.4)

## 2022-10-21 LAB — LACTIC ACID, PLASMA
Lactic Acid, Venous: 1.1 mmol/L (ref 0.5–1.9)
Lactic Acid, Venous: 0.9 mmol/L (ref 0.5–1.9)

## 2022-10-21 LAB — CBG MONITORING, ED: Glucose-Capillary: 124 mg/dL — ABNORMAL HIGH (ref 70–99)

## 2022-10-21 LAB — LIPASE, BLOOD: Lipase: 10 U/L — ABNORMAL LOW (ref 11–51)

## 2022-10-21 LAB — PHOSPHORUS: Phosphorus: 4.2 mg/dL (ref 2.5–4.6)

## 2022-10-21 MED ORDER — AMLODIPINE BESYLATE 10 MG PO TABS
10.0000 mg | ORAL_TABLET | Freq: Every day | ORAL | Status: DC
  Administered 2022-10-22 – 2022-10-23 (×2): 10 mg via ORAL
  Filled 2022-10-21 (×2): qty 1

## 2022-10-21 MED ORDER — PROCHLORPERAZINE EDISYLATE 10 MG/2ML IJ SOLN
5.0000 mg | Freq: Once | INTRAMUSCULAR | Status: AC
  Administered 2022-10-21: 5 mg via INTRAVENOUS
  Filled 2022-10-21: qty 2

## 2022-10-21 MED ORDER — HYDRALAZINE HCL 20 MG/ML IJ SOLN
10.0000 mg | Freq: Four times a day (QID) | INTRAMUSCULAR | Status: DC | PRN
  Administered 2022-10-22: 10 mg via INTRAVENOUS
  Filled 2022-10-21: qty 1

## 2022-10-21 MED ORDER — PANTOPRAZOLE SODIUM 40 MG IV SOLR
40.0000 mg | Freq: Once | INTRAVENOUS | Status: AC
  Administered 2022-10-21: 40 mg via INTRAVENOUS
  Filled 2022-10-21: qty 10

## 2022-10-21 MED ORDER — ALBUTEROL SULFATE HFA 108 (90 BASE) MCG/ACT IN AERS: 2.0000 | INHALATION_SPRAY | Freq: Four times a day (QID) | RESPIRATORY_TRACT | Status: DC | PRN

## 2022-10-21 MED ORDER — INSULIN PUMP
SUBCUTANEOUS | Status: DC
  Filled 2022-10-21: qty 1

## 2022-10-21 MED ORDER — ISOSORBIDE DINITRATE 20 MG PO TABS
20.0000 mg | ORAL_TABLET | Freq: Three times a day (TID) | ORAL | Status: DC
  Administered 2022-10-22 – 2022-10-23 (×4): 20 mg via ORAL
  Filled 2022-10-21 (×8): qty 1

## 2022-10-21 MED ORDER — METOPROLOL SUCCINATE ER 50 MG PO TB24
50.0000 mg | ORAL_TABLET | Freq: Every day | ORAL | Status: DC
  Administered 2022-10-22: 50 mg via ORAL
  Filled 2022-10-21: qty 1

## 2022-10-21 MED ORDER — SODIUM CHLORIDE 0.9 % IV SOLN
25.0000 mg | Freq: Once | INTRAVENOUS | Status: AC
  Administered 2022-10-21: 25 mg via INTRAVENOUS
  Filled 2022-10-21: qty 1

## 2022-10-21 MED ORDER — LORAZEPAM 2 MG/ML IJ SOLN
1.0000 mg | Freq: Once | INTRAMUSCULAR | Status: AC
  Administered 2022-10-21: 1 mg via INTRAVENOUS
  Filled 2022-10-21: qty 1

## 2022-10-21 MED ORDER — METOPROLOL TARTRATE 5 MG/5ML IV SOLN
5.0000 mg | Freq: Once | INTRAVENOUS | Status: AC
  Administered 2022-10-21: 5 mg via INTRAVENOUS
  Filled 2022-10-21: qty 5

## 2022-10-21 MED ORDER — HYDRALAZINE HCL 20 MG/ML IJ SOLN
20.0000 mg | Freq: Once | INTRAMUSCULAR | Status: AC
  Administered 2022-10-21: 20 mg via INTRAVENOUS
  Filled 2022-10-21: qty 1

## 2022-10-21 MED ORDER — PROCHLORPERAZINE EDISYLATE 10 MG/2ML IJ SOLN
5.0000 mg | Freq: Four times a day (QID) | INTRAMUSCULAR | Status: DC | PRN
  Administered 2022-10-21: 5 mg via INTRAVENOUS
  Filled 2022-10-21: qty 2

## 2022-10-21 MED ORDER — PROMETHAZINE HCL 25 MG/ML IJ SOLN
INTRAMUSCULAR | Status: AC
  Filled 2022-10-21: qty 1

## 2022-10-21 MED ORDER — SODIUM CHLORIDE 0.45 % IV SOLN: INTRAVENOUS | Status: DC

## 2022-10-21 MED ORDER — LORAZEPAM 0.5 MG PO TABS
0.5000 mg | ORAL_TABLET | Freq: Three times a day (TID) | ORAL | Status: DC | PRN
  Administered 2022-10-22: 0.5 mg via ORAL
  Filled 2022-10-21: qty 1

## 2022-10-21 MED ORDER — HEPARIN SODIUM (PORCINE) 5000 UNIT/ML IJ SOLN
5000.0000 [IU] | Freq: Three times a day (TID) | INTRAMUSCULAR | Status: DC
  Administered 2022-10-21 – 2022-10-23 (×5): 5000 [IU] via SUBCUTANEOUS
  Filled 2022-10-21 (×5): qty 1

## 2022-10-21 MED ORDER — DIPHENHYDRAMINE HCL 50 MG/ML IJ SOLN
12.5000 mg | Freq: Once | INTRAMUSCULAR | Status: AC
  Administered 2022-10-21: 12.5 mg via INTRAVENOUS
  Filled 2022-10-21: qty 1

## 2022-10-21 MED ORDER — INSULIN ASPART 100 UNIT/ML IJ SOLN
0.0000 [IU] | Freq: Three times a day (TID) | INTRAMUSCULAR | Status: DC
  Administered 2022-10-22: 1 [IU] via SUBCUTANEOUS
  Administered 2022-10-23 (×3): 2 [IU] via SUBCUTANEOUS

## 2022-10-21 NOTE — Progress Notes (Signed)
Pt. Arrived to unit via transport, Alert and oriented, c/o discomfort of the lower abdomen, BP elevated, bed placement called for MD to be singed

## 2022-10-21 NOTE — ED Triage Notes (Signed)
Pt arrived POV from home with nausea/vomiting and abdominal pain. States she was on her way to dialysis but couldn't make it. Also states she has been here multiple times for gastroparesis.

## 2022-10-21 NOTE — Progress Notes (Signed)
Call received from ED attending at [facility] for direct admission Patient is a 52 year old female with history of ESRD on HD, DM1, gastroparesis, CHF, CKD, diabetic foot She presented to ED today with complaint of nausea, vomiting, abdominal pain She was on her way to dialysis but could not make it. Seen multiple times in the past for gastroparesis.  In the ED, afebrile, heart rate in 120s, blood pressure elevated as high as 200, breathing on room air Patient refused CT scan stating she had multiple CT scans in the past and her symptoms she believes are due to flareup of gastroparesis. Creatinine 3.39, dialysis dependent,  lactic acid level normal, WBC count not elevated ED continue requested overnight observation because of persistent vomiting.  Observation bed ordered. I have reminded ED patient to resume her home antihypertensive meds while she is waiting for inpatient bed availability.

## 2022-10-21 NOTE — Plan of Care (Signed)

## 2022-10-21 NOTE — H&P (Signed)
History and Physical    [name] [id number] DOB: 30-Mar-1971 DOA: 10/21/2022  PCP: [name], PA  Patient coming from: [place]  I have personally briefly reviewed patient's old medical records in [place]  Chief Complaint: n/v/abdominal pain  HPI: [name] is a 52 y.o. female with medical history significant of   ESRD on HD, CHF, CKD,anemia, anxiety , asthma, CHF , Migraines , HLD , CAD s/p MI, DM1, gastroparesis,  who presents to ED with n/v/adominal pain similar to her bouts of gastroparesis. She notes no fever no chills, chest pain, diarrhea/dysuria, and no sick contact.  ED Course:  Per accepting MD "In the ED, afebrile, heart rate in 120s, blood pressure elevated as high as 200, breathing on room air Patient refused CT scan stating she had multiple CT scans in the past and her symptoms she believes are due to flareup of gastroparesis. Creatinine 3.39, dialysis dependent,  lactic acid level normal, WBC count not elevated"  Vitals:  Afeb, bp 192/113-204/138,  repeat bp s/p tx 177/80 H r 124, rr 20  sat 100% on ra  Wbc 9.7, hgb 10.7( 8.2) Lipase 10 N a 141, K 4.3 , bicarb 26, glu 217  cr 3.39 ( on HD)  ast 13, alkphos 129  Lactic 1.1  Fs 124  UA:  neg for uti Course notes episode of desat 84%  transitioned to 2L now 99% :   Tx ativan 1mg  , protonix 40mg  , phenegran 25mg  iv , hydralazine injection 20mg , metoprolol ingjection 5, benadryl , compazine   Review of Systems: As per HPI otherwise 10 point review of systems negative.   Past Medical History:  Diagnosis Date   Anemia    Anxiety    Arthritis    Asthma    CHF (congestive heart failure) (HCC)    Chronic kidney disease    Depression    Diabetic ulcer of left foot ([place]) 05/12/2013   Gastroparesis    Generalized abdominal pain    History of chicken pox    Loss of weight 12/02/2019   Migraines    Mixed hyperlipidemia 09/05/2022   Mood disorder (HCC)    anxiety   Myocardial infarction  ([place])    Prurigo nodularis    with diabetic dermopathy   Type 1 diabetes, uncontrolled, with neuropathy    [place]   Ulcers of both lower legs ([place]) 02/20/2014    Past Surgical History:  Procedure Laterality Date   AV FISTULA PLACEMENT Left 10/01/2022   Procedure: ARTERIOVENOUS (AV)FISTULA CREATION;  Surgeon: Rosetta Posner, MD;  Location: [facility];  Service: Vascular;  Laterality: Left;   BIOPSY  08/11/2020   Procedure: BIOPSY;  Surgeon: Ladene Artist, MD;  Location: [facility];  Service: Endoscopy;;   BIOPSY  04/02/2022   Procedure: BIOPSY;  Surgeon: Jackquline Denmark, MD;  Location: [facility];  Service: Gastroenterology;;   ESOPHAGOGASTRODUODENOSCOPY (EGD) WITH PROPOFOL N/A 08/11/2020   Procedure: ESOPHAGOGASTRODUODENOSCOPY (EGD) WITH PROPOFOL;  Surgeon: Ladene Artist, MD;  Location: [facility];  Service: Endoscopy;  Laterality: N/A;   ESOPHAGOGASTRODUODENOSCOPY (EGD) WITH PROPOFOL N/A 04/02/2022   Procedure: ESOPHAGOGASTRODUODENOSCOPY (EGD) WITH PROPOFOL;  Surgeon: Jackquline Denmark, MD;  Location: [facility];  Service: Gastroenterology;  Laterality: N/A;   ESOPHAGOGASTRODUODENOSCOPY (EGD) WITH PROPOFOL N/A 05/23/2022   Procedure: ESOPHAGOGASTRODUODENOSCOPY (EGD) WITH PROPOFOL;  Surgeon: Lucilla Lame, MD;  Location: [facility];  Service: Endoscopy;  Laterality: N/A;   ICD IMPLANT N/A 04/09/2022   Procedure: ICD IMPLANT;  Surgeon: Constance Haw,  MD;  Location: [place] [facility];  Service: Cardiovascular;  Laterality: N/A;   IR FLUORO GUIDE CV LINE RIGHT  10/07/2022   IR US GUIDE VASC ACCESS RIGHT  10/07/2022   treadmill stress test  01/2013   WNL, low risk study     reports that she has never smoked. She has never used smokeless tobacco. She reports that she does not currently use alcohol. She reports that she does not use drugs.  Allergies  Allergen Reactions   Atorvastatin Other (See Comments)    Dizziness    Dulaglutide Nausea And Vomiting and Other (See Comments)     Caused pancreatitis    Metoclopramide Other (See Comments)    Possible movement disorder or tardive dyskinesia while on reglan   Other Itching    Peas and carrots   Sertraline Nausea And Vomiting   Zofran [Ondansetron] Nausea And Vomiting and Other (See Comments)    Causes nausea vomiting to worsen / doesn't really work for patient.   Amoxicillin Itching and Rash   Lantus [Insulin Glargine] Itching and Rash    Has tolerated insulin glargine many times with no reported issues   Miconazole Nitrate Nausea And Vomiting and Rash    Family History  Problem Relation Age of Onset   Diabetes Mother        type 2   Hypertension Mother    Thyroid disease Mother    Bipolar disorder Mother    Heart disease Mother    Calcium disorder Mother    Cancer Maternal Grandmother        Breast, stomach   Cancer Paternal Grandmother        stomach, lung (smoker)   Diabetes Paternal Grandmother    Diabetes Paternal Grandfather    Heart failure Sister    Diabetes Sister    Stroke Maternal Aunt    Cancer Maternal Uncle        prostate   CAD Maternal Aunt        stents   Cancer Maternal Aunt 64       ovarian    Prior to Admission medications   Medication Sig Start Date End Date Taking? Authorizing Provider  acetaminophen (TYLENOL) 500 MG tablet Take 1,000 mg by mouth as needed for moderate pain or mild pain.    [provider]  albuterol (PROAIR HFA) 108 (90 Base) MCG/ACT inhaler Inhale 2 puffs into the lungs every 6 (six) hours as needed for wheezing or shortness of breath. Patient taking differently: Inhale 2 puffs into the lungs as needed for wheezing or shortness of breath. 07/03/20   [name], PA-C  amLODipine (NORVASC) 10 MG tablet Take 1 tablet (10 mg total) by mouth daily. 10/16/22 11/15/22  Darliss Cheney, MD  aspirin EC 81 MG tablet Take 1 tablet (81 mg total) by mouth daily. Swallow whole. 10/16/22   Darliss Cheney, MD  Ensure Max Protein (ENSURE MAX PROTEIN) LIQD Take 330  mLs (11 oz total) by mouth daily. Patient not taking: Reported on 10/09/2022 04/30/22   Modena Jansky, MD  hydrALAZINE (APRESOLINE) 100 MG tablet Take 1 tablet (100 mg total) by mouth every 8 (eight) hours. 10/15/22 11/14/22  Darliss Cheney, MD  hydrOXYzine (ATARAX) 50 MG tablet Take 50 mg by mouth in the morning, at noon, and at bedtime.    [provider]  insulin aspart (NOVOLOG) 100 UNIT/ML injection Inject 150 Units into the skin See admin instructions. Patient uses this with the [place]. Units vary according  to blood sugar. Loads 150 units every 3 days.    [provider]  isosorbide dinitrate (ISORDIL) 20 MG tablet Take 1 tablet (20 mg total) by mouth 3 (three) times daily. 10/15/22 11/14/22  Darliss Cheney, MD  LORazepam (ATIVAN) 0.5 MG tablet Take 1 tablet (0.5 mg total) by mouth every 8 (eight) hours as needed for anxiety (Refractory nausea and vomiting). Patient taking differently: Take 0.5 mg by mouth 4 (four) times daily as needed for anxiety (Refractory nausea and vomiting). 07/17/22   Domenic Polite, MD  metoprolol succinate (TOPROL-XL) 50 MG 24 hr tablet Take 1 tablet (50 mg total) by mouth daily. Take with or immediately following a meal. 09/05/22   Chandrasekhar, Mahesh A, MD  nitroGLYCERIN (NITROSTAT) 0.4 MG SL tablet Place 1 tablet (0.4 mg total) under the tongue every 5 (five) minutes as needed for chest pain. 09/05/22 12/04/22  Werner Lean, MD  oxyCODONE-acetaminophen (PERCOCET) 5-325 MG tablet Take 1 tablet by mouth every 6 (six) hours as needed for severe pain. Patient not taking: Reported on 10/09/2022 10/01/22   Rosetta Posner, MD  pantoprazole (PROTONIX) 40 MG tablet Take 1 tablet (40 mg total) by mouth 2 (two) times daily before a meal. 05/24/22 10/09/22  Rai, Ripudeep K, MD  polyethylene glycol (MIRALAX / GLYCOLAX) 17 g packet Take 17 g by mouth daily as needed for moderate constipation.    [provider]  pravastatin (PRAVACHOL) 40 MG tablet Take 1  tablet (40 mg total) by mouth every evening. 09/05/22   Chandrasekhar, Lyda Kalata A, MD  pregabalin (LYRICA) 50 MG capsule Take 50 mg by mouth as needed (neuropathy). 12/05/21   [provider]  prochlorperazine (COMPAZINE) 10 MG tablet Take 1 tablet (10 mg total) by mouth every 6 (six) hours as needed for nausea or vomiting. Patient taking differently: Take 10 mg by mouth as needed for nausea or vomiting. 05/24/22   Rai, [name], MD  promethazine (PHENERGAN) 12.5 MG tablet Take 12.5 mg by mouth as needed for nausea or vomiting.    [provider]  promethazine (PHENERGAN) 25 MG suppository Unwrap and place 1 suppository (25 mg total) rectally every 6 (six) hours as needed for nausea or vomiting. Patient taking differently: Place 25 mg rectally as needed for nausea or vomiting. 07/17/22   Domenic Polite, MD  scopolamine (TRANSDERM-SCOP) 1 MG/3DAYS Place 1 patch (1.5 mg total) onto the skin every 3 (three) days. 05/26/22   Rai, Ripudeep K, MD  tobramycin (TOBREX) 0.3 % ophthalmic solution Place 1-2 drops into both eyes See admin instructions. Instill 1-2 drops into both eyes 3 times daily the day before, day of, and the day after every 6 weeks eye injections per patient 11/17/20   [provider]    Physical Exam: Vitals:   10/21/22 1426 10/21/22 1500 10/21/22 1847 10/21/22 1916  BP: (!) 186/120 (!) 159/86 (!) 186/86 (!) 177/80  Pulse: (!) 114 99 (!) 114 (!) 114  Resp: (!) 31 18 19 17   Temp: 98.5 F (36.9 C)  98.9 F (37.2 C) 99 F (37.2 C)  TempSrc: Oral  Oral Oral  SpO2: 98% 97% 96% 90%  Weight:      Height:        Constitutional: NAD, calm, comfortable Vitals:   10/21/22 1426 10/21/22 1500 10/21/22 1847 10/21/22 1916  BP: (!) 186/120 (!) 159/86 (!) 186/86 (!) 177/80  Pulse: (!) 114 99 (!) 114 (!) 114  Resp: (!) 31 18 19 17   Temp: 98.5 F (  36.9 C)  98.9 F (37.2 C) 99 F (37.2 C)  TempSrc: Oral  Oral Oral  SpO2: 98% 97% 96% 90%  Weight:      Height:        Eyes: PERRL, lids and conjunctivae normal ENMT: Mucous membranes are dry,Posterior pharynx clear of any exudate or lesions.Normal dentition.  Neck: normal, supple, no masses, no thyromegaly Respiratory: clear to auscultation bilaterally, no wheezing, no crackles. Normal respiratory effort. No accessory muscle use.  Cardiovascular: Regular rate and rhythm, no murmurs / rubs / gallops. No extremity edema. 2+ pedal pulses.   Abdomen: soft ,no tenderness, no masses palpated. No hepatosplenomegaly. Bowel sounds positive.  Musculoskeletal: no clubbing / cyanosis. No joint deformity upper and lower extremities. Good ROM, no contractures. Normal muscle tone.  Skin: no rashes, lesions, ulcers. No induration Neurologic: CN 2-12 grossly intact. Sensation intact, l. Strength 5/5 in all 4.  Psychiatric: Normal judgment and insight. Alert and oriented x 3. Normal mood.    Labs on Admission: I have personally reviewed following labs and imaging studies  CBC: Recent Labs  Lab 10/15/22 0637 10/21/22 1107  WBC 9.8 9.7  NEUTROABS  --  8.3*  HGB 8.2* 10.7*  HCT 25.8* 34.3*  MCV 89.3 89.1  PLT 307 [id number]   Basic Metabolic Panel: Recent Labs  Lab 10/15/22 0637 10/21/22 1107  NA 136 141  K 3.8 4.3  CL 99 104  CO2 24 26  GLUCOSE 263* 217*  BUN 37* 22*  CREATININE 4.17* 3.39*  CALCIUM 7.7* 9.9  PHOS 4.3  --    GFR: Estimated Creatinine Clearance: 22.4 mL/min (A) (by C-G formula based on SCr of 3.39 mg/dL (H)). Liver Function Tests: Recent Labs  Lab 10/15/22 0637 10/21/22 1107  AST  --  13*  ALT  --  12  ALKPHOS  --  129*  BILITOT  --  1.2  PROT  --  8.1  ALBUMIN 2.2* 4.1   Recent Labs  Lab 10/21/22 1107  LIPASE 10*   No results for input(s): "AMMONIA" in the last 168 hours. Coagulation Profile: No results for input(s): "INR", "PROTIME" in the last 168 hours. Cardiac Enzymes: No results for input(s): "CKTOTAL", "CKMB", "CKMBINDEX", "TROPONINI" in the last 168 hours. BNP (last  3 results) No results for input(s): "PROBNP" in the last 8760 hours. HbA1C: No results for input(s): "HGBA1C" in the last 72 hours. CBG: Recent Labs  Lab 10/14/22 2209 10/15/22 0756 10/15/22 1227 10/21/22 1647  GLUCAP 261* 246* 170* 124*   Lipid Profile: No results for input(s): "CHOL", "HDL", "LDLCALC", "TRIG", "CHOLHDL", "LDLDIRECT" in the last 72 hours. Thyroid Function Tests: No results for input(s): "TSH", "T4TOTAL", "FREET4", "T3FREE", "THYROIDAB" in the last 72 hours. Anemia Panel: No results for input(s): "VITAMINB12", "FOLATE", "FERRITIN", "TIBC", "IRON", "RETICCTPCT" in the last 72 hours. Urine analysis:    Component Value Date/Time   COLORURINE YELLOW 10/21/2022 1330   APPEARANCEUR CLEAR 10/21/2022 1330   LABSPEC 1.013 10/21/2022 1330   PHURINE 8.5 (H) 10/21/2022 1330   GLUCOSEU 250 (A) 10/21/2022 1330   HGBUR TRACE (A) 10/21/2022 1330   BILIRUBINUR NEGATIVE 10/21/2022 1330   BILIRUBINUR negative 08/30/2020 1631   KETONESUR NEGATIVE 10/21/2022 1330   PROTEINUR >300 (A) 10/21/2022 1330   UROBILINOGEN 0.2 08/30/2020 1631   UROBILINOGEN 1.0 08/11/2008 1745   NITRITE NEGATIVE 10/21/2022 1330   LEUKOCYTESUR NEGATIVE 10/21/2022 1330    Radiological Exams on Admission: No results found.  EKG: Independently reviewed. pending  Assessment/Plan   Intractable nausea and  vomiting due to acute flare of gastroparesis  - admit to tele - continue with phenegran, ativan prn  -monitor electrolytes  -npo  -fluid 50cc/hr for 8 hours and monitor  -advance diet as tolerated   Uncontrolled Hypertension  -improved with treating acute medical problem as well as one dose of hydralzine  -continue with hydralazine prn  -resume home regimen as able  Type I DM -resume long acting insulin  -fs/iss -las A1c 7.5  ESRD MWF -missed HD today due to being ill -renal consult for HD in am  -electrolytes stable  Anxiety  Depression  -resume home regimen   HLD -resume  statin as able  Migraines  -no acute flare currenlty   DVT prophylaxis: heparin Code Status: full/ as discussed per patient wishes in event of cardiac arrest  Family Communication: none at bedside Disposition Plan: patient  expected to be admitted greater than 2 midnights  Consults called: renal for HD Admission status: progessive   Clance Boll MD [facility]   If 7PM-7AM, please contact night-coverage [email or web address] Password [facility]  10/21/2022, 8:50 PM

## 2022-10-21 NOTE — ED Notes (Signed)
CBG results given to Leonidas Romberg, RN

## 2022-10-21 NOTE — ED Provider Notes (Addendum)
[facility] Provider Note   CSN: [id number] Arrival date & time: 10/21/22  1019     History  Chief Complaint  Patient presents with   Abdominal Pain    [name] is a 52 y.o. female.  HPI Patient reports that she started vomiting during the night last night.  She reports this is typical for her gastroparesis.  She was unable to go to dialysis this morning.  She reports she has similar pain in the abdomen.  Diffuse aching quality.  No fevers, no diarrhea.    Home Medications Prior to Admission medications   Medication Sig Start Date End Date Taking? Authorizing Provider  acetaminophen (TYLENOL) 500 MG tablet Take 1,000 mg by mouth as needed for moderate pain or mild pain.    [provider]  albuterol (PROAIR HFA) 108 (90 Base) MCG/ACT inhaler Inhale 2 puffs into the lungs every 6 (six) hours as needed for wheezing or shortness of breath. Patient taking differently: Inhale 2 puffs into the lungs as needed for wheezing or shortness of breath. 07/03/20   [name], PA-C  amLODipine (NORVASC) 10 MG tablet Take 1 tablet (10 mg total) by mouth daily. 10/16/22 11/15/22  Darliss Cheney, MD  aspirin EC 81 MG tablet Take 1 tablet (81 mg total) by mouth daily. Swallow whole. 10/16/22   Darliss Cheney, MD  Ensure Max Protein (ENSURE MAX PROTEIN) LIQD Take 330 mLs (11 oz total) by mouth daily. Patient not taking: Reported on 10/09/2022 04/30/22   Modena Jansky, MD  hydrALAZINE (APRESOLINE) 100 MG tablet Take 1 tablet (100 mg total) by mouth every 8 (eight) hours. 10/15/22 11/14/22  Darliss Cheney, MD  hydrOXYzine (ATARAX) 50 MG tablet Take 50 mg by mouth in the morning, at noon, and at bedtime.    [provider]  insulin aspart (NOVOLOG) 100 UNIT/ML injection Inject 150 Units into the skin See admin instructions. Patient uses this with the [place]. Units vary according to blood sugar. Loads 150 units every 3 days.    [provider]  isosorbide dinitrate (ISORDIL) 20 MG tablet Take 1 tablet (20 mg total) by mouth 3 (three) times daily. 10/15/22 11/14/22  Darliss Cheney, MD  LORazepam (ATIVAN) 0.5 MG tablet Take 1 tablet (0.5 mg total) by mouth every 8 (eight) hours as needed for anxiety (Refractory nausea and vomiting). Patient taking differently: Take 0.5 mg by mouth 4 (four) times daily as needed for anxiety (Refractory nausea and vomiting). 07/17/22   Domenic Polite, MD  metoprolol succinate (TOPROL-XL) 50 MG 24 hr tablet Take 1 tablet (50 mg total) by mouth daily. Take with or immediately following a meal. 09/05/22   Chandrasekhar, Mahesh A, MD  nitroGLYCERIN (NITROSTAT) 0.4 MG SL tablet Place 1 tablet (0.4 mg total) under the tongue every 5 (five) minutes as needed for chest pain. 09/05/22 12/04/22  Werner Lean, MD  oxyCODONE-acetaminophen (PERCOCET) 5-325 MG tablet Take 1 tablet by mouth every 6 (six) hours as needed for severe pain. Patient not taking: Reported on 10/09/2022 10/01/22   Rosetta Posner, MD  pantoprazole (PROTONIX) 40 MG tablet Take 1 tablet (40 mg total) by mouth 2 (two) times daily before a meal. 05/24/22 10/09/22  Rai, Ripudeep K, MD  polyethylene glycol (MIRALAX / GLYCOLAX) 17 g packet Take 17 g by mouth daily as needed for moderate constipation.    [provider]  pravastatin (PRAVACHOL) 40 MG tablet Take 1 tablet (40 mg total) by mouth every  evening. 09/05/22   Chandrasekhar, Lyda Kalata A, MD  pregabalin (LYRICA) 50 MG capsule Take 50 mg by mouth as needed (neuropathy). 12/05/21   [provider]  prochlorperazine (COMPAZINE) 10 MG tablet Take 1 tablet (10 mg total) by mouth every 6 (six) hours as needed for nausea or vomiting. Patient taking differently: Take 10 mg by mouth as needed for nausea or vomiting. 05/24/22   Rai, [name], MD  promethazine (PHENERGAN) 12.5 MG tablet Take 12.5 mg by mouth as needed for nausea or vomiting.    [provider]  promethazine  (PHENERGAN) 25 MG suppository Unwrap and place 1 suppository (25 mg total) rectally every 6 (six) hours as needed for nausea or vomiting. Patient taking differently: Place 25 mg rectally as needed for nausea or vomiting. 07/17/22   Domenic Polite, MD  scopolamine (TRANSDERM-SCOP) 1 MG/3DAYS Place 1 patch (1.5 mg total) onto the skin every 3 (three) days. 05/26/22   Rai, Ripudeep K, MD  tobramycin (TOBREX) 0.3 % ophthalmic solution Place 1-2 drops into both eyes See admin instructions. Instill 1-2 drops into both eyes 3 times daily the day before, day of, and the day after every 6 weeks eye injections per patient 11/17/20   [provider]      Allergies    Atorvastatin, Dulaglutide, Metoclopramide, Other, Sertraline, Zofran [ondansetron], Amoxicillin, Lantus [insulin glargine], and Miconazole nitrate    Review of Systems   Review of Systems  Physical Exam Updated Vital Signs BP (!) 159/86   Pulse 99   Temp 98.5 F (36.9 C) (Oral)   Resp 18   Ht 6' (1.829 m)   Wt 81.6 kg   LMP 11/17/2021 (Approximate)   SpO2 97%   BMI 24.41 kg/m  Physical Exam Constitutional:      Comments: Patient is ill in appearance.  She is actively vomiting thin, coffee-ground dark material.  Mental status is clear.  Cardiovascular:     Comments: Tachycardia.  No appreciable rub murmur gallop. Pulmonary:     Comments: Patient is not exhibiting respiratory distress.  No appreciable crackles or rhonchi. Abdominal:     Comments: Abdomen soft without guarding but diffusely tender to palpation.  Musculoskeletal:        General: No swelling or tenderness. Normal range of motion.     Right lower leg: No edema.     Left lower leg: No edema.     Comments: No peripheral edema.  Skin:    General: Skin is warm and dry.     Coloration: Skin is pale.  Neurological:     General: No focal deficit present.     Mental Status: She is oriented to person, place, and time.     ED Results / Procedures /  Treatments   Labs (all labs ordered are listed, but only abnormal results are displayed) Labs Reviewed  LIPASE, BLOOD - Abnormal; Notable for the following components:      Result Value   Lipase 10 (*)    All other components within normal limits  COMPREHENSIVE METABOLIC PANEL - Abnormal; Notable for the following components:   Glucose, Bld 217 (*)    BUN 22 (*)    Creatinine, Ser 3.39 (*)    AST 13 (*)    Alkaline Phosphatase 129 (*)    GFR, Estimated 16 (*)    All other components within normal limits  URINALYSIS, ROUTINE W REFLEX MICROSCOPIC - Abnormal; Notable for the following components:   pH 8.5 (*)    Glucose,  UA 250 (*)    Hgb urine dipstick TRACE (*)    Protein, ur >300 (*)    All other components within normal limits  CBC WITH DIFFERENTIAL/PLATELET - Abnormal; Notable for the following components:   RBC 3.85 (*)    Hemoglobin 10.7 (*)    HCT 34.3 (*)    RDW 15.8 (*)    Neutro Abs 8.3 (*)    All other components within normal limits  CULTURE, BLOOD (ROUTINE X 2)  CULTURE, BLOOD (ROUTINE X 2)  LACTIC ACID, PLASMA  LACTIC ACID, PLASMA    EKG None  Radiology No results found.  Procedures Procedures   CRITICAL CARE Performed by: Charlesetta Shanks   Total critical care time: 30 minutes  Critical care time was exclusive of separately billable procedures and treating other patients.  Critical care was necessary to treat or prevent imminent or life-threatening deterioration.  Critical care was time spent personally by me on the following activities: development of treatment plan with patient and/or surrogate as well as nursing, discussions with consultants, evaluation of patient's response to treatment, examination of patient, obtaining history from patient or surrogate, ordering and performing treatments and interventions, ordering and review of laboratory studies, ordering and review of radiographic studies, pulse oximetry and re-evaluation of patient's  condition.  Medications Ordered in ED Medications  LORazepam (ATIVAN) injection 1 mg (1 mg Intravenous Given 10/21/22 1236)  promethazine (PHENERGAN) 25 mg in sodium chloride 0.9 % 50 mL IVPB (25 mg Intravenous New Bag/Given 10/21/22 1237)  pantoprazole (PROTONIX) injection 40 mg (40 mg Intravenous Given 10/21/22 1237)  promethazine (PHENERGAN) 25 MG/ML injection (  Given 10/21/22 1251)  metoprolol tartrate (LOPRESSOR) injection 5 mg (5 mg Intravenous Given 10/21/22 1445)  hydrALAZINE (APRESOLINE) injection 20 mg (20 mg Intravenous Given 10/21/22 1445)  prochlorperazine (COMPAZINE) injection 5 mg (5 mg Intravenous Given 10/21/22 1613)  diphenhydrAMINE (BENADRYL) injection 12.5 mg (12.5 mg Intravenous Given 10/21/22 1613)    ED Course/ Medical Decision Making/ A&P                             Medical Decision Making Amount and/or Complexity of Data Reviewed Labs: ordered.  Risk Prescription drug management. Decision regarding hospitalization.  Patient presents with onset of aggressive vomiting, hypertension and tachycardia.  Patient reports known history of gastroparesis and review of EMR confirms frequent episodes.  Patient is dialysis patient and was unable to go this morning due to symptoms.  During course of evaluation patient is actively vomiting thin brown coffee-ground material and sometimes coughing up phlegm with brown specks in it.  Mental status is clear.  Will proceed with broad evaluation including lab work and CT abdomen pelvis to rule out obstruction.   I have reviewed the patient's medical record and discussed treatment options with her that have been effective.  At this point by review of EMR and patient history Ativan and Phenergan have been most effective.  Will initiate this in conjunction with Protonix for what appears to be coffee-ground emesis.  Patient refused CT scan.  She reports she has had frequent prior CT scan and does not feel that this is different from her  previous gastroparesis.  At this time I agree if she is comfortable enough we can postpone or forego CT scan based on clinical appearance.  14: 00 recheck blood pressure 167/100.  Heart rate 107 regular narrow complex.  Patient is now resting quietly after Ativan and Phenergan.  She has also had a dose of Protonix.  She is not actively vomiting.  16: 00 patient's had rebound of active vomiting.  Addition of Compazine 5 mg and Benadryl 12.5 mg.  Patient has severe comorbid conditions of failure on dialysis and intractable vomiting.  Has been a recurrent problem.  She is actively vomiting and producing a coffee-ground material.  Will plan for admission given low probability of management of symptoms at home and poor response to prior treatments.  Will require management of hypertension by IV medications.   Consult: [facility] hospitalist for admission Dr. Pietro Cassis     Final Clinical Impression(s) / ED Diagnoses Final diagnoses:  Gastroparesis  Severe comorbid illness  Hypertensive urgency    Rx / DC Orders ED Discharge Orders     None         Charlesetta Shanks, MD 10/21/22 1644    Charlesetta Shanks, MD 10/21/22 1650

## 2022-10-21 NOTE — ED Notes (Signed)
Thomas at [facility] will send transport for [facility] at [facility] Rm# [id number](NS)

## 2022-10-21 NOTE — ED Notes (Signed)
Attempted to give report to [facility]N. Nurse to call [facility] back.

## 2022-10-22 ENCOUNTER — Encounter (HOSPITAL_COMMUNITY): Payer: Self-pay

## 2022-10-22 DIAGNOSIS — I160 Hypertensive urgency: Secondary | ICD-10-CM | POA: Diagnosis not present

## 2022-10-22 DIAGNOSIS — E1022 Type 1 diabetes mellitus with diabetic chronic kidney disease: Secondary | ICD-10-CM | POA: Diagnosis not present

## 2022-10-22 DIAGNOSIS — R112 Nausea with vomiting, unspecified: Secondary | ICD-10-CM | POA: Diagnosis not present

## 2022-10-22 DIAGNOSIS — Z79899 Other long term (current) drug therapy: Secondary | ICD-10-CM | POA: Diagnosis not present

## 2022-10-22 DIAGNOSIS — N186 End stage renal disease: Secondary | ICD-10-CM | POA: Diagnosis not present

## 2022-10-22 DIAGNOSIS — D631 Anemia in chronic kidney disease: Secondary | ICD-10-CM | POA: Diagnosis not present

## 2022-10-22 DIAGNOSIS — I2510 Atherosclerotic heart disease of native coronary artery without angina pectoris: Secondary | ICD-10-CM | POA: Diagnosis not present

## 2022-10-22 DIAGNOSIS — I509 Heart failure, unspecified: Secondary | ICD-10-CM | POA: Diagnosis not present

## 2022-10-22 DIAGNOSIS — Z992 Dependence on renal dialysis: Secondary | ICD-10-CM | POA: Diagnosis not present

## 2022-10-22 DIAGNOSIS — R1084 Generalized abdominal pain: Secondary | ICD-10-CM | POA: Diagnosis not present

## 2022-10-22 DIAGNOSIS — Z794 Long term (current) use of insulin: Secondary | ICD-10-CM | POA: Diagnosis not present

## 2022-10-22 DIAGNOSIS — J45909 Unspecified asthma, uncomplicated: Secondary | ICD-10-CM | POA: Diagnosis not present

## 2022-10-22 DIAGNOSIS — I132 Hypertensive heart and chronic kidney disease with heart failure and with stage 5 chronic kidney disease, or end stage renal disease: Secondary | ICD-10-CM | POA: Diagnosis not present

## 2022-10-22 DIAGNOSIS — E1043 Type 1 diabetes mellitus with diabetic autonomic (poly)neuropathy: Secondary | ICD-10-CM | POA: Diagnosis not present

## 2022-10-22 DIAGNOSIS — R748 Abnormal levels of other serum enzymes: Secondary | ICD-10-CM | POA: Diagnosis not present

## 2022-10-22 DIAGNOSIS — Z7982 Long term (current) use of aspirin: Secondary | ICD-10-CM | POA: Diagnosis not present

## 2022-10-22 LAB — GLUCOSE, CAPILLARY
Glucose-Capillary: 111 mg/dL — ABNORMAL HIGH (ref 70–99)
Glucose-Capillary: 128 mg/dL — ABNORMAL HIGH (ref 70–99)
Glucose-Capillary: 211 mg/dL — ABNORMAL HIGH (ref 70–99)
Glucose-Capillary: 78 mg/dL (ref 70–99)
Glucose-Capillary: 79 mg/dL (ref 70–99)
Glucose-Capillary: 87 mg/dL (ref 70–99)

## 2022-10-22 LAB — CBC
HCT: 27.3 % — ABNORMAL LOW (ref 36.0–46.0)
Hemoglobin: 8.9 g/dL — ABNORMAL LOW (ref 12.0–15.0)
MCH: 28.9 pg (ref 26.0–34.0)
MCHC: 32.6 g/dL (ref 30.0–36.0)
MCV: 88.6 fL (ref 80.0–100.0)
Platelets: 273 10*3/uL (ref 150–400)
RBC: 3.08 MIL/uL — ABNORMAL LOW (ref 3.87–5.11)
RDW: 15.8 % — ABNORMAL HIGH (ref 11.5–15.5)
WBC: 7.6 10*3/uL (ref 4.0–10.5)
nRBC: 0 % (ref 0.0–0.2)

## 2022-10-22 LAB — COMPREHENSIVE METABOLIC PANEL
ALT: 11 U/L (ref 0–44)
AST: 13 U/L — ABNORMAL LOW (ref 15–41)
Albumin: 2.5 g/dL — ABNORMAL LOW (ref 3.5–5.0)
Alkaline Phosphatase: 98 U/L (ref 38–126)
Anion gap: 8 (ref 5–15)
BUN: 21 mg/dL — ABNORMAL HIGH (ref 6–20)
CO2: 25 mmol/L (ref 22–32)
Calcium: 8.8 mg/dL — ABNORMAL LOW (ref 8.9–10.3)
Chloride: 108 mmol/L (ref 98–111)
Creatinine, Ser: 3.81 mg/dL — ABNORMAL HIGH (ref 0.44–1.00)
GFR, Estimated: 14 mL/min — ABNORMAL LOW (ref >60)
Glucose, Bld: 89 mg/dL (ref 70–99)
Potassium: 4.1 mmol/L (ref 3.5–5.1)
Sodium: 141 mmol/L (ref 135–145)
Total Bilirubin: 1.3 mg/dL — ABNORMAL HIGH (ref 0.3–1.2)
Total Protein: 6 g/dL — ABNORMAL LOW (ref 6.5–8.1)

## 2022-10-22 LAB — HEPATITIS B SURFACE ANTIGEN: Hepatitis B Surface Ag: NONREACTIVE

## 2022-10-22 MED ORDER — SODIUM CHLORIDE 0.9 % IV SOLN
125.0000 mg | Freq: Once | INTRAVENOUS | Status: AC
  Administered 2022-10-22: 125 mg via INTRAVENOUS
  Filled 2022-10-22: qty 10

## 2022-10-22 MED ORDER — PREGABALIN 25 MG PO CAPS: 50.0000 mg | ORAL_CAPSULE | ORAL | Status: DC | PRN

## 2022-10-22 MED ORDER — HEPARIN SODIUM (PORCINE) 1000 UNIT/ML DIALYSIS
2500.0000 [IU] | Freq: Once | INTRAMUSCULAR | Status: AC
  Administered 2022-10-22: 2500 [IU] via INTRAVENOUS_CENTRAL
  Filled 2022-10-22 (×2): qty 3

## 2022-10-22 MED ORDER — CHLORHEXIDINE GLUCONATE CLOTH 2 % EX PADS: 6.0000 | MEDICATED_PAD | Freq: Every day | CUTANEOUS | Status: DC

## 2022-10-22 MED ORDER — SODIUM CHLORIDE 0.9 % IV SOLN
125.0000 mg | INTRAVENOUS | Status: DC
  Administered 2022-10-23: 125 mg via INTRAVENOUS
  Filled 2022-10-22: qty 10

## 2022-10-22 MED ORDER — CHLORHEXIDINE GLUCONATE CLOTH 2 % EX PADS
6.0000 | MEDICATED_PAD | Freq: Every day | CUTANEOUS | Status: DC
  Administered 2022-10-23: 6 via TOPICAL

## 2022-10-22 MED ORDER — METOPROLOL SUCCINATE ER 50 MG PO TB24
50.0000 mg | ORAL_TABLET | Freq: Every day | ORAL | Status: DC
  Administered 2022-10-23: 50 mg via ORAL
  Filled 2022-10-22: qty 1

## 2022-10-22 MED ORDER — MAGNESIUM SULFATE 2 GM/50ML IV SOLN
2.0000 g | Freq: Once | INTRAVENOUS | Status: AC
  Administered 2022-10-22: 2 g via INTRAVENOUS
  Filled 2022-10-22: qty 50

## 2022-10-22 MED ORDER — INSULIN GLARGINE-YFGN 100 UNIT/ML ~~LOC~~ SOLN
10.0000 [IU] | Freq: Every day | SUBCUTANEOUS | Status: DC
  Filled 2022-10-22: qty 0.1

## 2022-10-22 MED ORDER — ANTICOAGULANT SODIUM CITRATE 4% (200MG/5ML) IV SOLN: 5.0000 mL | Status: DC | PRN

## 2022-10-22 MED ORDER — ALTEPLASE 2 MG IJ SOLR: 2.0000 mg | Freq: Once | INTRAMUSCULAR | Status: DC | PRN

## 2022-10-22 MED ORDER — HYDRALAZINE HCL 50 MG PO TABS
100.0000 mg | ORAL_TABLET | Freq: Three times a day (TID) | ORAL | Status: DC
  Administered 2022-10-22 – 2022-10-23 (×5): 100 mg via ORAL
  Filled 2022-10-22 (×5): qty 2

## 2022-10-22 MED ORDER — HEPARIN SODIUM (PORCINE) 1000 UNIT/ML DIALYSIS
1000.0000 [IU] | INTRAMUSCULAR | Status: DC | PRN
  Administered 2022-10-22: 1000 [IU]
  Filled 2022-10-22: qty 1

## 2022-10-22 MED ORDER — DARBEPOETIN ALFA 150 MCG/0.3ML IJ SOSY
150.0000 ug | PREFILLED_SYRINGE | INTRAMUSCULAR | Status: DC
  Administered 2022-10-22: 150 ug via SUBCUTANEOUS
  Filled 2022-10-22: qty 0.3

## 2022-10-22 MED ORDER — ACETAMINOPHEN 325 MG PO TABS
650.0000 mg | ORAL_TABLET | Freq: Four times a day (QID) | ORAL | Status: DC | PRN
  Administered 2022-10-22 – 2022-10-23 (×2): 650 mg via ORAL
  Filled 2022-10-22 (×2): qty 2

## 2022-10-22 MED ORDER — ASPIRIN 81 MG PO TBEC
81.0000 mg | DELAYED_RELEASE_TABLET | Freq: Every day | ORAL | Status: DC
  Administered 2022-10-22 – 2022-10-23 (×2): 81 mg via ORAL
  Filled 2022-10-22 (×2): qty 1

## 2022-10-22 NOTE — Progress Notes (Addendum)
Pt seen in room and c/o abdominal pain pointing to her abdomen, Per pt she takes tylenol for pain.  Attending MD made aware of above, and received an order for tylenol prn. NO  other complaints. Pt remains alert/oriented in no apparent distress.

## 2022-10-22 NOTE — Consult Note (Signed)
Renal Service Consult Note [facility]  [place] 10/22/2022 Sol Blazing, MD Requesting Physician: Dr. Pietro Cassis  Reason for Consult: ESRD pt admitted for gastroparesis HPI: The patient is a 52 y.o. year-old w/ PMH as below who presented to ED yesterday evening c/o N/V/ abd pain similar to her repeated bouts of gastroparesis. In ED HR 120s, BP 200s on RA. Missed HD yesterday, usually HD is MWF. We are asked to see for dialysis.   Pt seen in room. Started HD 2 wks ago. States she has been seen for N/V about "30-40 times" at hospitals in the last 1-2 years. She changed her GI to [facility] and they want to wait for 1 month on dialysis to see if this improves/ affects her N/V in any way, before doing other procedures or medications. She missed HD yesterday due to "feeling so bad".    ROS - denies CP, no joint pain, no HA, no blurry vision, no rash, no dysuria, no difficulty voiding   Past Medical History  Past Medical History:  Diagnosis Date   Anemia    Anxiety    Arthritis    Asthma    CHF (congestive heart failure) (HCC)    Chronic kidney disease    Depression    Diabetic ulcer of left foot ([place]) 05/12/2013   Gastroparesis    Generalized abdominal pain    History of chicken pox    Loss of weight 12/02/2019   Migraines    Mixed hyperlipidemia 09/05/2022   Mood disorder (HCC)    anxiety   Myocardial infarction ([place])    Prurigo nodularis    with diabetic dermopathy   Type 1 diabetes, uncontrolled, with neuropathy    [place]   Ulcers of both lower legs ([place]) 02/20/2014   Past Surgical History  Past Surgical History:  Procedure Laterality Date   AV FISTULA PLACEMENT Left 10/01/2022   Procedure: ARTERIOVENOUS (AV)FISTULA CREATION;  Surgeon: Rosetta Posner, MD;  Location: [facility];  Service: Vascular;  Laterality: Left;   BIOPSY  08/11/2020   Procedure: BIOPSY;  Surgeon: Ladene Artist, MD;  Location: [facility];  Service: Endoscopy;;   BIOPSY  04/02/2022    Procedure: BIOPSY;  Surgeon: Jackquline Denmark, MD;  Location: [facility];  Service: Gastroenterology;;   ESOPHAGOGASTRODUODENOSCOPY (EGD) WITH PROPOFOL N/A 08/11/2020   Procedure: ESOPHAGOGASTRODUODENOSCOPY (EGD) WITH PROPOFOL;  Surgeon: Ladene Artist, MD;  Location: [facility];  Service: Endoscopy;  Laterality: N/A;   ESOPHAGOGASTRODUODENOSCOPY (EGD) WITH PROPOFOL N/A 04/02/2022   Procedure: ESOPHAGOGASTRODUODENOSCOPY (EGD) WITH PROPOFOL;  Surgeon: Jackquline Denmark, MD;  Location: [facility];  Service: Gastroenterology;  Laterality: N/A;   ESOPHAGOGASTRODUODENOSCOPY (EGD) WITH PROPOFOL N/A 05/23/2022   Procedure: ESOPHAGOGASTRODUODENOSCOPY (EGD) WITH PROPOFOL;  Surgeon: Lucilla Lame, MD;  Location: [facility] ENDOSCOPY;  Service: Endoscopy;  Laterality: N/A;   ICD IMPLANT N/A 04/09/2022   Procedure: ICD IMPLANT;  Surgeon: Constance Haw, MD;  Location: [facility];  Service: Cardiovascular;  Laterality: N/A;   IR FLUORO GUIDE CV LINE RIGHT  10/07/2022   IR US GUIDE VASC ACCESS RIGHT  10/07/2022   treadmill stress test  01/2013   WNL, low risk study   Family History  Family History  Problem Relation Age of Onset   Diabetes Mother        type 2   Hypertension Mother    Thyroid disease Mother    Bipolar disorder Mother    Heart disease Mother    Calcium disorder Mother  Cancer Maternal Grandmother        Breast, stomach   Cancer Paternal Grandmother        stomach, lung (smoker)   Diabetes Paternal Grandmother    Diabetes Paternal Grandfather    Heart failure Sister    Diabetes Sister    Stroke Maternal Aunt    Cancer Maternal Uncle        prostate   CAD Maternal Aunt        stents   Cancer Maternal Aunt 64       ovarian   Social History  reports that she has never smoked. She has never used smokeless tobacco. She reports that she does not currently use alcohol. She reports that she does not use drugs. Allergies  Allergies  Allergen Reactions   Atorvastatin Other (See  Comments)    Dizziness    Dulaglutide Nausea And Vomiting and Other (See Comments)    Caused pancreatitis    Metoclopramide Other (See Comments)    Possible movement disorder or tardive dyskinesia while on reglan   Other Itching    Peas and carrots   Sertraline Nausea And Vomiting   Zofran [Ondansetron] Nausea And Vomiting and Other (See Comments)    Causes nausea vomiting to worsen / doesn't really work for patient.   Amoxicillin Itching and Rash   Lantus [Insulin Glargine] Itching and Rash    Has tolerated insulin glargine many times with no reported issues   Miconazole Nitrate Nausea And Vomiting and Rash   Home medications Prior to Admission medications   Medication Sig Start Date End Date Taking? Authorizing Provider  acetaminophen (TYLENOL) 500 MG tablet Take 1,000 mg by mouth as needed for moderate pain or mild pain.    [provider]  albuterol (PROAIR HFA) 108 (90 Base) MCG/ACT inhaler Inhale 2 puffs into the lungs every 6 (six) hours as needed for wheezing or shortness of breath. Patient taking differently: Inhale 2 puffs into the lungs as needed for wheezing or shortness of breath. 07/03/20   [name], PA-C  amLODipine (NORVASC) 10 MG tablet Take 1 tablet (10 mg total) by mouth daily. 10/16/22 11/15/22  Darliss Cheney, MD  aspirin EC 81 MG tablet Take 1 tablet (81 mg total) by mouth daily. Swallow whole. 10/16/22   Darliss Cheney, MD  Ensure Max Protein (ENSURE MAX PROTEIN) LIQD Take 330 mLs (11 oz total) by mouth daily. Patient not taking: Reported on 10/09/2022 04/30/22   Modena Jansky, MD  hydrALAZINE (APRESOLINE) 100 MG tablet Take 1 tablet (100 mg total) by mouth every 8 (eight) hours. 10/15/22 11/14/22  Darliss Cheney, MD  hydrOXYzine (ATARAX) 50 MG tablet Take 50 mg by mouth in the morning, at noon, and at bedtime.    [provider]  insulin aspart (NOVOLOG) 100 UNIT/ML injection Inject 150 Units into the skin See admin instructions. Patient uses  this with the [place]. Units vary according to blood sugar. Loads 150 units every 3 days.    [provider]  isosorbide dinitrate (ISORDIL) 20 MG tablet Take 1 tablet (20 mg total) by mouth 3 (three) times daily. 10/15/22 11/14/22  Darliss Cheney, MD  LORazepam (ATIVAN) 0.5 MG tablet Take 1 tablet (0.5 mg total) by mouth every 8 (eight) hours as needed for anxiety (Refractory nausea and vomiting). Patient taking differently: Take 0.5 mg by mouth 4 (four) times daily as needed for anxiety (Refractory nausea and vomiting). 07/17/22   Domenic Polite, MD  metoprolol succinate (TOPROL-XL) 50 MG  24 hr tablet Take 1 tablet (50 mg total) by mouth daily. Take with or immediately following a meal. 09/05/22   Chandrasekhar, Mahesh A, MD  nitroGLYCERIN (NITROSTAT) 0.4 MG SL tablet Place 1 tablet (0.4 mg total) under the tongue every 5 (five) minutes as needed for chest pain. 09/05/22 12/04/22  Werner Lean, MD  oxyCODONE-acetaminophen (PERCOCET) 5-325 MG tablet Take 1 tablet by mouth every 6 (six) hours as needed for severe pain. Patient not taking: Reported on 10/09/2022 10/01/22   Rosetta Posner, MD  pantoprazole (PROTONIX) 40 MG tablet Take 1 tablet (40 mg total) by mouth 2 (two) times daily before a meal. 05/24/22 10/09/22  Rai, Ripudeep K, MD  polyethylene glycol (MIRALAX / GLYCOLAX) 17 g packet Take 17 g by mouth daily as needed for moderate constipation.    [provider]  pravastatin (PRAVACHOL) 40 MG tablet Take 1 tablet (40 mg total) by mouth every evening. 09/05/22   Chandrasekhar, Lyda Kalata A, MD  pregabalin (LYRICA) 50 MG capsule Take 50 mg by mouth as needed (neuropathy). 12/05/21   [provider]  prochlorperazine (COMPAZINE) 10 MG tablet Take 1 tablet (10 mg total) by mouth every 6 (six) hours as needed for nausea or vomiting. Patient taking differently: Take 10 mg by mouth as needed for nausea or vomiting. 05/24/22   Rai, [name], MD  promethazine (PHENERGAN) 12.5 MG tablet  Take 12.5 mg by mouth as needed for nausea or vomiting.    [provider]  promethazine (PHENERGAN) 25 MG suppository Unwrap and place 1 suppository (25 mg total) rectally every 6 (six) hours as needed for nausea or vomiting. Patient taking differently: Place 25 mg rectally as needed for nausea or vomiting. 07/17/22   Domenic Polite, MD  scopolamine (TRANSDERM-SCOP) 1 MG/3DAYS Place 1 patch (1.5 mg total) onto the skin every 3 (three) days. 05/26/22   Rai, Ripudeep K, MD  tobramycin (TOBREX) 0.3 % ophthalmic solution Place 1-2 drops into both eyes See admin instructions. Instill 1-2 drops into both eyes 3 times daily the day before, day of, and the day after every 6 weeks eye injections per patient 11/17/20   [provider]     Vitals:   10/22/22 0852 10/22/22 1000 10/22/22 1134 10/22/22 1141  BP: (!) 181/82 (!) 144/66 (!) 171/85 (!) 148/83  Pulse:   (!) 101 (!) 104  Resp:   (!) 25 (!) 25  Temp:   97.9 F (36.6 C)   TempSrc:      SpO2:   100% (!) 70%  Weight:   79.7 kg   Height:       Exam Gen alert, no distress, looks weak and a bit tremulous No rash, cyanosis or gangrene Sclera anicteric, throat clear  No jvd or bruits Chest clear bilat to bases, no rales/ wheezing RRR no MRG Abd soft ntnd no mass or ascites +bs GU defer MS no joint effusions or deformity Ext no LE or UE edema, no wounds or ulcers Neuro is alert, Ox 3 , nf, tremulous     RIJ TDC intact      Home meds include - norvasc 10, asa, hydralazine 100 tid, isordil 20 tid, toprol xl 50 qd, lyrica, ativan prn, insulin aspart, percocet prn, protonix, pravastatin, prns/ vits/ supps     OP HD: MWF [place] 4h  400/ 600  81.5kg   3K/2.5Ca bath  TDC  Hep 2500 - last HD 3/15 post wt 80.8kg - missed 3/18 yesterday - venofer 100mg  IV  tiw thru 3/27 - mircera 150 mcg IV q 2 wks, has not started yet   Assessment/ Plan: Intractable N/V - likely gastroparesis +/- other (uremia). Long hx of gastroparesis,  f/b [facility] now. N/V from uremia should resolve in a couple of weeks from starting HD. Per pmd.  ESRD - on HD MWF. Missed HD yest, plan HD today as make up, then HD again tomorrow.  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  MBD ckd - CCa and phos are in range. Not sure if taking binders. Does not appear to be on any vdra's. Will follow.  DM type 1 - w/ sig complications   Kelly Splinter  MD [facility] 10/22/2022, 12:25 PM  Recent Labs  Lab 10/21/22 2146 10/22/22 0130  HGB 9.7* 8.9*  ALBUMIN 2.8* 2.5*  CALCIUM 9.0 8.8*  PHOS 4.2  --   CREATININE 3.67* 3.81*  K 4.0 4.1   Inpatient medications:  amLODipine  10 mg Oral Daily   aspirin EC  81 mg Oral Daily   Chlorhexidine Gluconate Cloth  6 each Topical Q0600   heparin  5,000 Units Subcutaneous Q8H   hydrALAZINE  100 mg Oral Q8H   insulin aspart  0-9 Units Subcutaneous TID WC   isosorbide dinitrate  20 mg Oral TID   [START ON 10/23/2022] metoprolol succinate  50 mg Oral Daily    anticoagulant sodium citrate     albuterol, alteplase, anticoagulant sodium citrate, heparin, hydrALAZINE, LORazepam, pregabalin, prochlorperazine

## 2022-10-22 NOTE — Inpatient Diabetes Management (Signed)
Inpatient Diabetes Program Recommendations  AACE/ADA: New Consensus Statement on Inpatient Glycemic Control (2015)  Target Ranges:  Prepandial:   less than 140 mg/dL      Peak postprandial:   less than 180 mg/dL (1-2 hours)      Critically ill patients:  140 - 180 mg/dL   Lab Results  Component Value Date   GLUCAP 78 10/22/2022   HGBA1C 7.5 (H) 10/05/2022    Review of Glycemic Control  Latest Reference Range & Units 10/22/22 00:04 10/22/22 03:57 10/22/22 07:41  Glucose-Capillary 70 - 99 mg/dL 87 79 78   Diabetes history: type 1 Outpatient Diabetes medications: Omnipod insulin pump with Novolog(Total basal= 24 units), FreeStyle Libre3 CGM  Current orders for Inpatient glycemic control: Novolog 0-9 units TID   Inpatient Diabetes Program Recommendations:     If patient to remain inpatient, consider:   -Semglee 10 units QD.  Thanks, Bronson Curb, MSN, RNC-OB Diabetes Coordinator [phone] (8a-5p)

## 2022-10-22 NOTE — Progress Notes (Signed)
PROGRESS NOTE  [name]  DOB: 1971-04-29  PCP: [name], [place] [id number]  DOA: 10/21/2022  LOS: 0 days  Hospital Day: 2  Brief narrative: [name] is a 52 y.o. female with PMH significant for ESRD-HD-MWF, DM2, diabetic gastroparesis, HTN, HLD, CHF, CAD, chronic anemia, anxiety/depression. 3/18, patient presented to ED at [place] with complaint of nausea, vomiting, abdominal pain.  She was on her way to dialysis but could not make it.  No fever, chills or chest pain, no sick contacts.  She has been seen multiple times in the past for gastroparesis.  In the ED, afebrile, heart rate in 120s, blood pressure elevated as high as 200, breathing on room air. While in the ED, she had an episode of desaturation to 84% and was started on 2 L of oxygen by nasal cannula. Creatinine 3.39, dialysis dependent,  lactic acid level normal, WBC count not elevated Urinalysis negative for UTI Patient refused CT scan stating she had multiple CT scans in the past and her symptoms she believes are due to flareup of gastroparesis. She was tried on symptomatic management in the ED with IV antiemetics but symptoms persisted. EDP requested overnight observation because of persistent vomiting.  Subjective: Patient was seen and examined this morning.  Pleasant middle-aged African-American female.  Propped up in bed.  Less nauseous.  Last vomiting was yesterday.  Able to tolerate clear liquid diet this morning.  Wants to eat something soft. Chart reviewed Overnight, no fever, heart rate improved to 90s, blood pressure in 180s. She was kept n.p.o. because of persistent vomiting.  Blood sugar level in 70s this morning.  Assessment and plan: Intractable nausea and vomiting  Acute flare of gastroparesis  Currently on symptomatic management with IV Protonix, IV Compazine, IV Ativan as needed, IV hydration.  She states IV Reglan had worked for her in the past but it was stopped by her GI doctor  at [facility] because of tremors, which I presume as extrapyramidal side effect. Tolerating clear liquid diet this morning.  Wants to try something soft.  Ordered for lunch.  Type 1 diabetes mellitus A1c 7.5 on 10/05/2022 PTA on insulin pump which is currently on hold Currently on sliding scale insulin with Accu-Cheks.  Given persistent low blood sugar level, Lantus was held this morning.   Continue Lyrica for diabetic neuropathy Recent Labs  Lab 10/15/22 1227 10/21/22 1647 10/22/22 0004 10/22/22 0357 10/22/22 0741  GLUCAP 170* 124* 87 79 78   Uncontrolled hypertension Chronic combined systolic and diastolic CHF Blood pressure elevated as high as 200s in the ED.  Partly because of acute distress Last echo from 10/05/2022 with EF 40 to 45%, LV global hypokinesis, mild concentric LVH, grade 3 diastolic dysfunction, moderately elevated pulmonary artery systolic pressure and moderately dilated both atria  PTA on metoprolol 50 mg daily, Isordil 20 mg 3 times daily, hydralazine 100 mg 3 times daily, amlodipine 10 mg daily. Will resume all oral meds.  If she is not able to tolerate, will utilize IV hydralazine as needed.  ESRD-HD-MWF Missed HD on 3/18 because of symptoms Electrolytes stable Nephrology consulted. Recent Labs  Lab 10/21/22 1107 10/21/22 2146 10/22/22 0130  NA 141 143 141  K 4.3 4.0 4.1  CL 104 108 108  CO2 26 27 25   GLUCOSE 217* 107* 89  BUN 22* 21* 21*  CREATININE 3.39* 3.67* 3.81*  CALCIUM 9.9 9.0 8.8*  MG  --  1.6*  --   PHOS  --  4.2  --  Elevated Alkphos RUQ ultrasound showed hepatomegaly Repeat LFTs showed an improvement in alk phos.  CAD/HLD Continue aspirin.  Plan to resume statin at discharge  Anxiety/Depression  PTA on hydroxyzine 50 mg 3 times daily,   Anemia of chronic disease Baseline hemoglobin close to 9.  Remains stable.  Continue to monitor Recent Labs    04/08/22 1800 04/09/22 0022 07/13/22 0825 07/14/22 0041 10/07/22 0256  10/07/22 0754 10/14/22 0805 10/15/22 [id number] 10/21/22 1107 10/21/22 2146 10/22/22 0130  HGB  --    < >  --    < > 7.2*   < > 8.4* 8.2* 10.7* 9.7* 8.9*  MCV  --    < >  --    < > 87.7   < > 89.4 89.3 89.1 88.0 88.6  VITAMINB12  --   --  378  --  547  --   --   --   --   --   --   FOLATE  --   --  8.6  --  13.4  --   --   --   --   --   --   FERRITIN 108  --  134  --  933*  --   --   --   --   --   --   TIBC 218*  --  221*  --  167*  --   --   --   --   --   --   IRON 32  --  29  --  14*  --   --   --   --   --   --   RETICCTPCT  --   --  2.6  --   --   --   --   --   --   --   --    < > = values in this interval not displayed.     Mobility: Encourage ambulation.  Goals of care   Code Status: Full Code     DVT prophylaxis:  heparin injection 5,000 Units Start: 10/21/22 2200   Antimicrobials: None Fluid: Stop IV fluid this morning Consultants: Nephrology Family Communication: Family at bedside  Status: Observation Level of care:  Telemetry Medical   Needs to continue in-hospital care:  Improving symptoms but is still requiring IV antiemetics  Prognosis:  Guarded given ESRD, recurrent flareup of gastroparesis  Patient from: Home Anticipated d/c to: Home in 1 to 2 days     Scheduled Meds:  amLODipine  10 mg Oral Daily   aspirin EC  81 mg Oral Daily   Chlorhexidine Gluconate Cloth  6 each Topical Q0600   heparin  2,500 Units Dialysis Once in dialysis   heparin  5,000 Units Subcutaneous Q8H   hydrALAZINE  100 mg Oral Q8H   insulin aspart  0-9 Units Subcutaneous TID WC   isosorbide dinitrate  20 mg Oral TID   [START ON 10/23/2022] metoprolol succinate  50 mg Oral Daily    PRN meds: albuterol, alteplase, anticoagulant sodium citrate, heparin, hydrALAZINE, LORazepam, pregabalin, prochlorperazine   Infusions:   anticoagulant sodium citrate      Diet:  Diet Order             Diet renal/carb modified with fluid restriction Diet-HS Snack? Nothing; Fluid  restriction: 1200 mL Fluid; Room service appropriate? Yes; Fluid consistency: Thin  Diet effective now  Antimicrobials: Anti-infectives (From admission, onward)    None       Skin assessment:       Nutritional status:  Body mass index is 24.41 kg/m.          Objective: Vitals:   10/22/22 0852 10/22/22 1000  BP: (!) 181/82 (!) 144/66  Pulse:    Resp:    Temp:    SpO2:     No intake or output data in the 24 hours ending 10/22/22 1104 Filed Weights   10/21/22 1026  Weight: 81.6 kg   Weight change:  Body mass index is 24.41 kg/m.   Physical Exam: General exam: Pleasant, middle-aged African-American female.  Not in active pain Skin: No rashes, lesions or ulcers. HEENT: Atraumatic, normocephalic, no obvious bleeding Lungs: Clear to auscultation bilaterally CVS: Mild tachycardia this morning, no murmur GI/Abd soft, epigastric tenderness present, nondistended, bowel sound present CNS: Alert, awake, oriented x 3 Psychiatry: Sad affect Extremities: No pedal edema, no calf tenderness  Data Review: I have personally reviewed the laboratory data and studies available.  F/u labs ordered Unresulted Labs (From admission, onward)     Start     Ordered   Unscheduled  Basic metabolic panel  Tomorrow morning,   R        10/22/22 1104   Unscheduled  CBC with Differential/Platelet  Tomorrow morning,   R        10/22/22 1104            Total time spent in review of labs and imaging, patient evaluation, formulation of plan, documentation and communication with family: 26 minutes  Signed, Terrilee Croak, MD [facility] 10/22/2022

## 2022-10-22 NOTE — Procedures (Signed)
   I was present at this dialysis session, have reviewed the session itself and made  appropriate changes Kelly Splinter MD [place] pager [phone]   10/22/2022, 12:54 PM

## 2022-10-22 NOTE — Progress Notes (Addendum)
POST HD TX NOTE  ** added note: pt was asymptomatic to any s/s of hypoglycemia throughout tx. Pt asked for graham crackers once and I asked her was she feeling symptomatic and/or needed me to check her blood sugar and pt denied having any s/s of hypoglycemia at that time and did not need me to check her cbg at that time.   10/22/22 1516  Vitals  Temp 97.7 F (36.5 C)  Temp Source Oral  BP (!) 173/90  MAP (mmHg) 109  BP Location Right Leg  BP Method Automatic  Patient Position (if appropriate) Lying  Pulse Rate 99  Pulse Rate Source Monitor  ECG Heart Rate 100  Resp 19  Oxygen Therapy  SpO2 100 %  O2 Device Room Air  Pulse Oximetry Type Continuous  During Treatment Monitoring  Intra-Hemodialysis Comments (S)   (post HD tx VS check)  Post Treatment  Dialyzer Clearance Lightly streaked  Duration of HD Treatment -hour(s) 3.5 hour(s)  Hemodialysis Intake (mL) 110 mL (ferric gluconate)  Liters Processed 84  Fluid Removed (mL) 90 mL (275ml (value from machine) - 122ml (ferric gluconate) = 37ml)  Tolerated HD Treatment Yes  Post-Hemodialysis Comments (S)  tx completed w/o problem, UF goal met, blood rinsed back. VSS. Medication Admin: Heparin 2500 units bolus, Ferric Gluconate 125mg  IVPB, Heparin Dwells 3200 units  Hemodialysis Catheter Right Subclavian Double lumen Permanent (Tunneled)  Placement Date/Time: 10/07/22 1458   Serial / Lot #: [id number]  Expiration Date: 05/11/27  Time Out: Correct patient;Correct site;Correct procedure  Maximum sterile barrier precautions: Hand hygiene;Cap;Mask;Sterile gown;Sterile gloves;Large sterile ...  Site Condition No complications  Blue Lumen Status Heparin locked;Dead end cap in place  Red Lumen Status Heparin locked;Dead end cap in place  Purple Lumen Status N/A  Catheter fill solution Heparin 1000 units/ml  Catheter fill volume (Arterial) 1.6 cc  Catheter fill volume (Venous) 1.6  Dressing Type Transparent  Dressing Status Antimicrobial  disc in place;Clean, Dry, Intact  Drainage Description None  Dressing Change Due 10/29/22  Post treatment catheter status Capped and Clamped

## 2022-10-22 NOTE — Progress Notes (Deleted)
Cardiology Office Note:    Date:  10/22/2022  ID:  [name], DOB 09-28-1970, MRN [id number] [facility] Cardiologist:  Werner Lean, MD Electrophysiologist:  Melida Quitter, MD { Click to update primary MD,subspecialty MD or APP then REFRESH:1}     Patient Profile:   *** Cardiac Studies & Procedures       ECHOCARDIOGRAM  ECHOCARDIOGRAM COMPLETE 10/05/2022  Narrative ECHOCARDIOGRAM REPORT    Patient Name:   Megan HELMLE Date of Exam: 10/05/2022 Medical Rec #:  [id number]      Height:       72.0 in Accession #:    [id number]     Weight:       179.5 lb Date of Birth:  [birth year]     BSA:          2.035 m Patient Age:    52 years       BP:           120/72 mmHg Patient Gender: F              HR:           94 bpm. Exam Location:  Inpatient  Procedure: 2D Echo, Cardiac Doppler and Color Doppler  Indications:    Chest Pain R07.9  History:        Patient has no prior history of Echocardiogram examinations. CHF, CAD and Previous Myocardial Infarction; Risk Factors:Diabetes and Hypertension.  Sonographer:    Luane School RDCS Referring Phys: [id number] Pax   1. Left ventricular ejection fraction, by estimation, is 40 to 45%. The left ventricle has mildly decreased function. The left ventricle demonstrates global hypokinesis. There is mild concentric left ventricular hypertrophy. Left ventricular diastolic parameters are consistent with Grade III diastolic dysfunction (restrictive). Elevated left atrial pressure. 2. Right ventricular systolic function is mildly reduced. The right ventricular size is moderately enlarged. There is moderately elevated pulmonary artery systolic pressure. 3. Left atrial size was moderately dilated. 4. Right atrial size was moderately dilated. 5. The mitral valve is normal in structure. No evidence of mitral valve regurgitation. No evidence of mitral stenosis. 6. Tricuspid valve regurgitation is  moderate to severe. 7. The aortic valve is tricuspid. Aortic valve regurgitation is not visualized. No aortic stenosis is present. 8. The inferior vena cava is dilated in size with <50% respiratory variability, suggesting right atrial pressure of 15 mmHg.  Comparison(s): Prior images reviewed side by side. The left ventricular function is significantly worse. The right ventricular systolic function is worse. TR severity and PA hypertension are also worse, with evidence of hypervolemia.  FINDINGS Left Ventricle: Left ventricular ejection fraction, by estimation, is 40 to 45%. The left ventricle has mildly decreased function. The left ventricle demonstrates global hypokinesis. The left ventricular internal cavity size was normal in size. There is mild concentric left ventricular hypertrophy. Left ventricular diastolic parameters are consistent with Grade III diastolic dysfunction (restrictive). Elevated left atrial pressure.  Right Ventricle: The right ventricular size is moderately enlarged. No increase in right ventricular wall thickness. Right ventricular systolic function is mildly reduced. There is moderately elevated pulmonary artery systolic pressure. The tricuspid regurgitant velocity is 3.05 m/s, and with an assumed right atrial pressure of 15 mmHg, the estimated right ventricular systolic pressure is [id number].  Left Atrium: Left atrial size was moderately dilated.  Right Atrium: Right atrial size was moderately dilated.  Pericardium: Trivial pericardial effusion is present.  Mitral Valve: The mitral valve is normal in  structure. There is mild thickening of the mitral valve leaflet(s). Mild mitral annular calcification. No evidence of mitral valve regurgitation. No evidence of mitral valve stenosis.  Tricuspid Valve: The tricuspid valve is normal in structure. Tricuspid valve regurgitation is moderate to severe.  Aortic Valve: The aortic valve is tricuspid. Aortic valve regurgitation  is not visualized. No aortic stenosis is present.  Pulmonic Valve: The pulmonic valve was normal in structure. Pulmonic valve regurgitation is trivial.  Aorta: The aortic root and ascending aorta are structurally normal, with no evidence of dilitation.  Venous: The inferior vena cava is dilated in size with less than 50% respiratory variability, suggesting right atrial pressure of 15 mmHg.  IAS/Shunts: No atrial level shunt detected by color flow Doppler.  Additional Comments: A device lead is visualized in the right ventricle. There is pleural effusion in the left lateral region.   LEFT VENTRICLE PLAX 2D LVIDd:         4.70 cm     Diastology LVIDs:         3.80 cm     LV e' medial:    6.53 cm/s LV PW:         1.30 cm     LV E/e' medial:  16.1 LV IVS:        1.14 cm     LV e' lateral:   8.05 cm/s LVOT diam:     2.00 cm     LV E/e' lateral: 13.0 LV SV:         43 LV SV Index:   21 LVOT Area:     3.14 cm  LV Volumes (MOD) LV vol d, MOD A2C: 85.9 ml LV vol d, MOD A4C: 78.0 ml LV vol s, MOD A2C: 54.3 ml LV vol s, MOD A4C: 53.8 ml LV SV MOD A2C:     31.6 ml LV SV MOD A4C:     78.0 ml LV SV MOD BP:      31.4 ml  RIGHT VENTRICLE            IVC RV S prime:     9.90 cm/s  IVC diam: 2.40 cm TAPSE (M-mode): 1.7 cm  LEFT ATRIUM           Index        RIGHT ATRIUM           Index LA diam:      4.67 cm 2.30 cm/m   RA Area:     19.40 cm LA Vol (A2C): 82.5 ml 40.55 ml/m  RA Volume:   58.20 ml  28.60 ml/m LA Vol (A4C): 66.5 ml 32.68 ml/m AORTIC VALVE LVOT Vmax:   77.50 cm/s LVOT Vmean:  58.000 cm/s LVOT VTI:    0.138 m  AORTA Ao Root diam: 3.40 cm Ao Asc diam:  3.30 cm Ao Desc diam: 2.40 cm  MITRAL VALVE                TRICUSPID VALVE MV Area (PHT): 6.96 cm     TR Peak grad:   37.2 mmHg MV Decel Time: 109 msec     TR Vmax:        305.00 cm/s MV E velocity: 105.00 cm/s MV A velocity: 41.60 cm/s   SHUNTS MV E/A ratio:  2.52         Systemic VTI:  0.14 m Systemic Diam:  2.00 cm  Dani Gobble Croitoru MD Electronically signed by Sanda Klein MD Signature Date/Time: 10/05/2022/2:53:24 PM  Final     CT SCANS  CT CORONARY MORPH W/CTA COR W/SCORE 01/19/2020  Addendum 01/19/2020  3:10 PM ADDENDUM REPORT: 01/19/2020 15:08  EXAM: OVER-READ INTERPRETATION  CT CHEST  The following report is an over-read performed by radiologist Dr. Eben Burow [facility] on 01/19/2020. This over-read does not include interpretation of cardiac or coronary anatomy or pathology. The coronary calcium score/coronary CTA interpretation by the cardiologist is attached.  COMPARISON:  None.  FINDINGS: The visualized portions of the lower lung fields show no suspicious nodules, masses, or infiltrates. Small bilateral pleural effusions are seen.  The visualized portions of the mediastinum and chest wall are unremarkable.  IMPRESSION: Small bilateral pleural effusions. No other significant non-cardiovascular abnormality seen in visualized portion of the thorax.   Electronically Signed By: [name] M.D. On: 01/19/2020 15:08  Narrative CLINICAL DATA:  Shortness of breath/Normal stress test/Rule out balanced ischemia  EXAM: Cardiac/Coronary CTA  TECHNIQUE: The patient was scanned on a [employer]. A 100 kV retrospective scan was triggered in the descending thoracic aorta at 111 HU's. Axial non-contrast 3 mm slices were carried out through the heart. The data set was analyzed on a dedicated work station and scored using the [place]. Gantry rotation speed was 250 msecs and collimation was .6 mm. 5 mg IV diltiazem and 0.8 mg of sl NTG was given. The 3D data set was reconstructed in 5% intervals of the 35-75 % of the R-R cycle. Diastolic phases were analyzed on a dedicated work station using MPR, MIP and VRT modes. The patient received 80 cc of contrast.  FINDINGS: Image quality: excellent.  Noise artifact is: Limited.  Coronary  Arteries:  Normal coronary origin.  Right dominance.  Left main: The left main is a large caliber vessel with a normal take off from the left coronary cusp that bifurcates to form a left anterior descending artery and a left circumflex artery. There is no plaque or stenosis.  Left anterior descending artery: The LAD is patent without evidence of plaque or stenosis. The LAD gives off 1 large patent diagonal branch.  Left circumflex artery: The LCX is non-dominant and patent with no evidence of plaque or stenosis. The LCX gives off 1 patent obtuse marginal branch.  Right coronary artery: The RCA is dominant with normal take off from the right coronary cusp. There is no evidence of plaque or stenosis. The RCA terminates as a PDA and right posterolateral branch without evidence of plaque or stenosis.  Right Atrium: Right atrial size is severely dilated.  Right Ventricle: The right ventricular cavity is within normal limits.  Left Atrium: Left atrial size is severely dilated with no left atrial appendage filling defect.  Left Ventricle: The ventricular cavity size is within normal limits. There are no stigmata of prior infarction. There is no abnormal filling defect.  Pulmonary arteries: Normal size without proximal filling defect.  Pulmonary veins: Normal pulmonary venous drainage.  Pericardium: Normal thickness with no significant effusion or calcium present.  Cardiac valves: The aortic valve is trileaflet without significant calcification. The mitral valve is normal structure without significant calcification.  Aorta: Normal caliber with no significant disease.  Extra-cardiac findings: See attached radiology report for non-cardiac structures.  IMPRESSION: 1. Coronary calcium score of 0.  2. Normal coronary origin with right dominance.  3. Normal coronary arteries.  4. Severe biatrial enlargement.  Eleonore Chiquito, MD  Electronically Signed: By: Eleonore Chiquito On: 01/19/2020 12:56  History of Present Illness:   Sheletha Fonville is a 52 y.o. female ***  ROS ***    Studies Reviewed:    EKG:  ***  ***  Risk Assessment/Calculations:   {Does this patient have ATRIAL FIBRILLATION?:[phone]}         Physical Exam:   VS:  LMP 11/17/2021 (Approximate)    Wt Readings from Last 3 Encounters:  10/22/22 175 lb 7.8 oz (79.6 kg)  10/11/22 180 lb 1.9 oz (81.7 kg)  10/01/22 177 lb 0.5 oz (80.3 kg)    Physical Exam***    ASSESSMENT AND PLAN:   No problem-specific Assessment & Plan notes found for this encounter. ***    {Are you ordering a CV Procedure (e.g. stress test, cath, DCCV, TEE, etc)?   Press F2        :[id number]  Dispo:  No follow-ups on file. Signed, Richardson Dopp, PA-C

## 2022-10-22 NOTE — Consult Note (Signed)
Patient name: [name] MRN: [id number] DOB: 06/12/1971 Sex: female  REASON FOR CONSULT:   Clotted AVF and arm pain.  The consult is requested by Dr. Soyla Murphy.  HPI:   [name] is a pleasant 52 y.o. female who is currently in the hospital with gastroparesis.  She has end-stage renal disease and is on dialysis.  She has a functioning right IJ catheter.  She had a left first stage basilic vein transposition created by Dr. Donnetta Hutching on 10/01/2022.  She was seen on 10/06/2022 by our service and it was noted that her fistula was clotted.  On my history today she complains of some pain near the incisions in the left arm at the antecubital level.  She does not have any significant forearm pain.  She does not have any hand pain.  Current Facility-Administered Medications  Medication Dose Route Frequency Provider Last Rate Last Admin   albuterol (VENTOLIN HFA) 108 (90 Base) MCG/ACT inhaler 2 puff  2 puff Inhalation Q6H PRN Charlesetta Shanks, MD       amLODipine (NORVASC) tablet 10 mg  10 mg Oral Daily Charlesetta Shanks, MD   10 mg at 10/22/22 0843   aspirin EC tablet 81 mg  81 mg Oral Daily Dahal, Marlowe Aschoff, MD   81 mg at 10/22/22 0910   Chlorhexidine Gluconate Cloth 2 % PADS 6 each  6 each Topical Q0600 Roney Jaffe, MD       [START ON 10/23/2022] Chlorhexidine Gluconate Cloth 2 % PADS 6 each  6 each Topical Q0600 Roney Jaffe, MD       Darbepoetin Alfa (ARANESP) injection 150 mcg  150 mcg Subcutaneous Q Tue-1800 Roney Jaffe, MD       Derrill Memo ON 10/23/2022] ferric gluconate (FERRLECIT) 125 mg in sodium chloride 0.9 % 100 mL IVPB  125 mg Intravenous Q M,W,F-HD Roney Jaffe, MD       heparin injection 5,000 Units  5,000 Units Subcutaneous Q8H Myles Rosenthal A, MD   5,000 Units at 10/22/22 0610   hydrALAZINE (APRESOLINE) injection 10 mg  10 mg Intravenous Q6H PRN Clance Boll, MD   10 mg at 10/22/22 0610   hydrALAZINE (APRESOLINE) tablet 100 mg  100 mg Oral Q8H Dahal, Marlowe Aschoff, MD   100  mg at 10/22/22 0852   insulin aspart (novoLOG) injection 0-9 Units  0-9 Units Subcutaneous TID WC Clance Boll, MD       isosorbide dinitrate (ISORDIL) tablet 20 mg  20 mg Oral TID Charlesetta Shanks, MD   20 mg at 10/22/22 0932   LORazepam (ATIVAN) tablet 0.5 mg  0.5 mg Oral Q8H PRN Charlesetta Shanks, MD       Derrill Memo ON 10/23/2022] metoprolol succinate (TOPROL-XL) 24 hr tablet 50 mg  50 mg Oral Daily Dahal, Binaya, MD       pregabalin (LYRICA) capsule 50 mg  50 mg Oral PRN Dahal, Marlowe Aschoff, MD       prochlorperazine (COMPAZINE) injection 5 mg  5 mg Intravenous Q6H PRN Clance Boll, MD   5 mg at 10/21/22 2105    REVIEW OF SYSTEMS:  [X]  denotes positive finding, [ ]  denotes negative finding Vascular    Leg swelling    Cardiac    Chest pain or chest pressure:    Shortness of breath upon exertion:    Short of breath when lying flat:    Irregular heart rhythm:    Constitutional    Fever or chills:     PHYSICAL EXAM:   Vitals:  10/22/22 1500 10/22/22 1510 10/22/22 1516 10/22/22 1601  BP: (!) 185/96 (!) 173/95 (!) 173/90 (!) 134/56  Pulse: 95 97 99 94  Resp: 19 17 19 17   Temp:   97.7 F (36.5 C) 98.1 F (36.7 C)  TempSrc:   Oral Oral  SpO2: 100% 100% 100% 98%  Weight:   79.6 kg   Height:        GENERAL: The patient is a well-nourished female, in no acute distress. The vital signs are documented above. CARDIOVASCULAR: There is a regular rate and rhythm. PULMONARY: There is good air exchange bilaterally without wheezing or rales. VASCULAR:  She has a palpable left radial and ulnar pulse.  The left hand is warm and well-perfused. The incisions are healing nicely. I cannot palpate a thrill or auscultated a bruit in her left arm fistula.  DATA:   She has an IV in her right arm so I do not think it is worth getting a vein map on the right at this point.  MEDICAL ISSUES:   CLOTTED LEFT FIRST STAGE BASILIC VEIN TRANSPOSITION: The patient has a functioning catheter.  Her  fistula is clotted and I suspect her pain is related to some mild phlebitis associated with her clotted fistula.  She has no evidence of steal.  I recommended warm compresses.  Once this has improved I think she could be considered for new access.  Given that she has an AICD on the left I think she should be considered for a right arm access.  We will have her follow-up in [place] with Dr. Donnetta Hutching.   Deitra Mayo [facility] [phone]

## 2022-10-23 ENCOUNTER — Ambulatory Visit: Payer: BC Managed Care – PPO | Admitting: Physician Assistant

## 2022-10-23 DIAGNOSIS — R112 Nausea with vomiting, unspecified: Secondary | ICD-10-CM | POA: Diagnosis not present

## 2022-10-23 LAB — CBC WITH DIFFERENTIAL/PLATELET
Abs Immature Granulocytes: 0.01 10*3/uL (ref 0.00–0.07)
Basophils Absolute: 0.1 10*3/uL (ref 0.0–0.1)
Basophils Relative: 1 %
Eosinophils Absolute: 0.1 10*3/uL (ref 0.0–0.5)
Eosinophils Relative: 1 %
HCT: 26.7 % — ABNORMAL LOW (ref 36.0–46.0)
Hemoglobin: 8.4 g/dL — ABNORMAL LOW (ref 12.0–15.0)
Immature Granulocytes: 0 %
Lymphocytes Relative: 31 %
Lymphs Abs: 1.9 10*3/uL (ref 0.7–4.0)
MCH: 28.6 pg (ref 26.0–34.0)
MCHC: 31.5 g/dL (ref 30.0–36.0)
MCV: 90.8 fL (ref 80.0–100.0)
Monocytes Absolute: 0.4 10*3/uL (ref 0.1–1.0)
Monocytes Relative: 6 %
Neutro Abs: 3.8 10*3/uL (ref 1.7–7.7)
Neutrophils Relative %: 61 %
Platelets: 252 10*3/uL (ref 150–400)
RBC: 2.94 MIL/uL — ABNORMAL LOW (ref 3.87–5.11)
RDW: 15.9 % — ABNORMAL HIGH (ref 11.5–15.5)
WBC: 6.2 10*3/uL (ref 4.0–10.5)
nRBC: 0 % (ref 0.0–0.2)

## 2022-10-23 LAB — BASIC METABOLIC PANEL
Anion gap: 7 (ref 5–15)
BUN: 13 mg/dL (ref 6–20)
CO2: 29 mmol/L (ref 22–32)
Calcium: 8.1 mg/dL — ABNORMAL LOW (ref 8.9–10.3)
Chloride: 98 mmol/L (ref 98–111)
Creatinine, Ser: 2.78 mg/dL — ABNORMAL HIGH (ref 0.44–1.00)
GFR, Estimated: 20 mL/min — ABNORMAL LOW (ref >60)
Glucose, Bld: 201 mg/dL — ABNORMAL HIGH (ref 70–99)
Potassium: 4.4 mmol/L (ref 3.5–5.1)
Sodium: 134 mmol/L — ABNORMAL LOW (ref 135–145)

## 2022-10-23 LAB — GLUCOSE, CAPILLARY
Glucose-Capillary: 199 mg/dL — ABNORMAL HIGH (ref 70–99)
Glucose-Capillary: 162 mg/dL — ABNORMAL HIGH (ref 70–99)
Glucose-Capillary: 168 mg/dL — ABNORMAL HIGH (ref 70–99)

## 2022-10-23 MED ORDER — HEPARIN SODIUM (PORCINE) 1000 UNIT/ML DIALYSIS
2500.0000 [IU] | Freq: Once | INTRAMUSCULAR | Status: AC
  Administered 2022-10-23: 2500 [IU] via INTRAVENOUS_CENTRAL
  Filled 2022-10-23: qty 3

## 2022-10-23 MED ORDER — ALTEPLASE 2 MG IJ SOLR: 2.0000 mg | Freq: Once | INTRAMUSCULAR | Status: DC | PRN

## 2022-10-23 MED ORDER — PROCHLORPERAZINE MALEATE 10 MG PO TABS: 10.0000 mg | ORAL_TABLET | Freq: Four times a day (QID) | ORAL | 0 refills | Status: DC | PRN

## 2022-10-23 MED ORDER — ANTICOAGULANT SODIUM CITRATE 4% (200MG/5ML) IV SOLN: 5.0000 mL | Status: DC | PRN

## 2022-10-23 MED ORDER — HEPARIN SODIUM (PORCINE) 1000 UNIT/ML IJ SOLN
INTRAMUSCULAR | Status: AC
  Administered 2022-10-23: 1000 [IU]
  Filled 2022-10-23: qty 1

## 2022-10-23 MED ORDER — INSULIN GLARGINE-YFGN 100 UNIT/ML ~~LOC~~ SOLN
8.0000 [IU] | Freq: Every day | SUBCUTANEOUS | Status: DC
  Administered 2022-10-23: 8 [IU] via SUBCUTANEOUS
  Filled 2022-10-23: qty 0.08

## 2022-10-23 MED ORDER — HEPARIN SODIUM (PORCINE) 1000 UNIT/ML DIALYSIS: 1000.0000 [IU] | INTRAMUSCULAR | Status: DC | PRN

## 2022-10-23 NOTE — Progress Notes (Signed)
POST HD TX NOTE  10/23/22 1807  Vitals  Temp 98.2 F (36.8 C)  Temp Source Oral  BP (!) 180/79  MAP (mmHg) 105  BP Location Right Arm  BP Method Automatic  Patient Position (if appropriate) Lying  Pulse Rate 92  Pulse Rate Source Monitor  ECG Heart Rate 92  Resp (!) 21  Oxygen Therapy  SpO2 100 %  O2 Device Room Air  Pulse Oximetry Type Continuous  During Treatment Monitoring  Intra-Hemodialysis Comments (S)   (post HD tx VS check)  Post Treatment  Dialyzer Clearance (S)  Lightly streaked (however she had a lot of clotting at the top of the dialyzer)  Duration of HD Treatment -hour(s) 3.5 hour(s)  Hemodialysis Intake (mL) 110 mL (Ferric Gluconate)  Liters Processed 84  Fluid Removed (mL) 90 mL (200nk (value from machine) - 1106ml Ferric gluconage = 64ml)  Tolerated HD Treatment Yes  Post-Hemodialysis Comments (S)  tx completed w/o problem, UF goal met, blood rinsed back, VSS. Medication Ademin: Heparin 2500 units bolus, Ferric Gluconate 125mg  IVPB, Heparin Dwells 3200 units  Hemodialysis Catheter Right Subclavian Double lumen Permanent (Tunneled)  Placement Date/Time: 10/07/22 1458   Serial / Lot #: [id number]  Expiration Date: 05/11/27  Time Out: Correct patient;Correct site;Correct procedure  Maximum sterile barrier precautions: Hand hygiene;Cap;Mask;Sterile gown;Sterile gloves;Large sterile ...  Site Condition No complications  Blue Lumen Status Heparin locked;Dead end cap in place  Red Lumen Status Heparin locked;Dead end cap in place  Purple Lumen Status N/A  Catheter fill solution Heparin 1000 units/ml  Catheter fill volume (Arterial) 1.6 cc  Catheter fill volume (Venous) 1.6  Dressing Type Transparent  Dressing Status Antimicrobial disc in place;Clean, Dry, Intact  Drainage Description None  Dressing Change Due 10/29/22  Post treatment catheter status Capped and Clamped

## 2022-10-23 NOTE — Progress Notes (Signed)
Discharge summary packet provided to pt with instructions. Pt verbalized understanding of instructions. No complaints voiced. Pt d/c to home as ordered. Pt remains alert//oriented in no apparent distress. Significant other is responsible for hier ride.

## 2022-10-23 NOTE — Progress Notes (Signed)
[facility] Progress Note   Subjective:   Patient seen and examined at bedside.  Nausea/vomiting better today.  Seen by vascular yesterday who recommended applying heat to thrombosed AVF with improvement in pain noted.  Denies CP, SOB, abdominal pain and n/v/d.  Discussed fluid restrictions with patient.  Answered all questions regarding dialysis and kidney function.   Objective Vitals:   10/23/22 0550 10/23/22 0720 10/23/22 0934 10/23/22 1000  BP: (!) 152/72   (!) 153/65  Pulse:   93 92  Resp:   17 16  Temp:  98.6 F (37 C)    TempSrc:  Oral    SpO2:   99% 99%  Weight:      Height:       Physical Exam General:chronically ill appearing female in NAD Heart:RRR Lungs:CTAB, nml WOB on RA Abdomen:soft, NTND Extremities:no LE edema Dialysis Access: TDC, LU AVF thrombosed    Filed Weights   10/21/22 1026 10/22/22 1134 10/22/22 1516  Weight: 81.6 kg 79.7 kg 79.6 kg    Intake/Output Summary (Last 24 hours) at 10/23/2022 1121 Last data filed at 10/22/2022 1516 Gross per 24 hour  Intake --  Output 90 ml  Net -90 ml    Additional Objective Labs: Basic Metabolic Panel: Recent Labs  Lab 10/21/22 2146 10/22/22 0130 10/23/22 0204  NA 143 141 134*  K 4.0 4.1 4.4  CL 108 108 98  CO2 27 25 29   GLUCOSE 107* 89 201*  BUN 21* 21* 13  CREATININE 3.67* 3.81* 2.78*  CALCIUM 9.0 8.8* 8.1*  PHOS 4.2  --   --    Liver Function Tests: Recent Labs  Lab 10/21/22 1107 10/21/22 2146 10/22/22 0130  AST 13* 15 13*  ALT 12 14 11   ALKPHOS 129* 93 98  BILITOT 1.2 1.5* 1.3*  PROT 8.1 6.5 6.0*  ALBUMIN 4.1 2.8* 2.5*   Recent Labs  Lab 10/21/22 1107  LIPASE 10*   CBC: Recent Labs  Lab 10/21/22 1107 10/21/22 2146 10/22/22 0130 10/23/22 0204  WBC 9.7 7.4 7.6 6.2  NEUTROABS 8.3*  --   --  3.8  HGB 10.7* 9.7* 8.9* 8.4*  HCT 34.3* 30.1* 27.3* 26.7*  MCV 89.1 88.0 88.6 90.8  PLT 319 267 273 252   Blood Culture    Component Value Date/Time   SDES BLOOD  RIGHT ANTECUBITAL 10/21/2022 1856   SPECREQUEST  10/21/2022 1856    BOTTLES DRAWN AEROBIC AND ANAEROBIC Blood Culture results may not be optimal due to an inadequate volume of blood received in culture bottles   CULT  10/21/2022 1856    NO GROWTH 2 DAYS Performed at [facility], [place] [address] [place], [place] [address]    REPTSTATUS PENDING 10/21/2022 1856    CBG: Recent Labs  Lab 10/22/22 0741 10/22/22 1105 10/22/22 1555 10/22/22 2321 10/23/22 0732  GLUCAP 78 111* 128* 211* 199*    Studies/Results: US Abdomen Limited RUQ (LIVER/GB)  Result Date: 10/21/2022 CLINICAL DATA:  Hypoxia. EXAM: ULTRASOUND ABDOMEN LIMITED RIGHT UPPER QUADRANT COMPARISON:  None Available. FINDINGS: Gallbladder: No gallstones or wall thickening visualized (2.7 mm. No sonographic Murphy sign noted by sonographer. Common bile duct: Diameter: 2.0 mm Liver: The liver is enlarged and measures 19.28 cm in length. No focal lesion identified. Within normal limits in parenchymal echogenicity. Portal vein is patent on color Doppler imaging with normal direction of blood flow towards the liver. Other: Of incidental note is the presence of a small right pleural effusion. IMPRESSION: 1. Hepatomegaly.  2. Small right pleural effusion. Electronically Signed   By: Virgina Norfolk M.D.   On: 10/21/2022 23:25   DG CHEST PORT 1 VIEW  Result Date: 10/21/2022 CLINICAL DATA:  Hypoxemia EXAM: PORTABLE CHEST 1 VIEW COMPARISON:  10/08/2022 FINDINGS: Right dialysis catheter and left AICD remain in place, unchanged. Heart borderline in size. Mild vascular congestion. Left lower lobe airspace opacity. Right lung clear. No effusions or acute bony abnormality. IMPRESSION: Borderline cardiomegaly, vascular congestion. Left lower lobe opacity could reflect pneumonia. Electronically Signed   By: Rolm Baptise M.D.   On: 10/21/2022 21:27    Medications:  ferric gluconate (FERRLECIT) IVPB      amLODipine  10 mg Oral Daily   aspirin EC   81 mg Oral Daily   Chlorhexidine Gluconate Cloth  6 each Topical Q0600   Chlorhexidine Gluconate Cloth  6 each Topical Q0600   darbepoetin (ARANESP) injection - DIALYSIS  150 mcg Subcutaneous Q Tue-1800   [START ON 10/24/2022] heparin  2,500 Units Dialysis Once in dialysis   heparin  5,000 Units Subcutaneous Q8H   hydrALAZINE  100 mg Oral Q8H   insulin aspart  0-9 Units Subcutaneous TID WC   insulin glargine-yfgn  8 Units Subcutaneous Daily   isosorbide dinitrate  20 mg Oral TID   metoprolol succinate  50 mg Oral Daily    Dialysis Orders: [facility] 4h  400/ 600  81.5kg   3K/2.5Ca bath  TDC  Hep 2500 - last HD 3/15 post wt 80.8kg - missed 3/18 yesterday - venofer 100mg  IV tiw thru 3/27 - mircera 150 mcg IV q 2 wks, has not started yet     Assessment/ Plan: Intractable N/V - likely gastroparesis +/- other (uremia). Long hx of gastroparesis, f/b [facility] now. N/V from uremia should resolve in a couple of weeks from starting HD. Per pmd.  ESRD - on HD MWF - recent start.  HD today per regular schedule.  HTN - improved.  Continue home meds.  Volume - Does not appear grossly volume overloaded.  Establishing dry weight.  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 start IV Fe load as planned in OP setting.  MBD ckd - CCa and phos are in range. Not on binders or VDRA. DM type 1 - w/ sig complications Thrombosed AVF w/pain - seen by Dr. Scot Dock, pain likely 2/2 thrombophlebitis. Plan for new access likely on the R in the future.   Jen Mow, PA-C [facility] 10/23/2022,11:21 AM  LOS: 0 days

## 2022-10-23 NOTE — Discharge Summary (Signed)
Physician Discharge Summary  [name] [id number] DOB: 1970-09-13 DOA: 10/21/2022  PCP: [name], PA  Admit date: 10/21/2022 Discharge date: 10/24/2022  Admitted From: Home Discharge disposition: Home  Recommendations at discharge:  Recommended small consistent meals.   Judicious use of as needed antiemetics   Brief narrative: [name] is a 52 y.o. female with PMH significant for ESRD-HD-MWF, DM2, diabetic gastroparesis, HTN, HLD, CHF, CAD, chronic anemia, anxiety/depression. 3/18, patient presented to ED at [facility] with complaint of nausea, vomiting, abdominal pain.  She was on her way to dialysis but could not make it.  No fever, chills or chest pain, no sick contacts.  She has been seen multiple times in the past for gastroparesis.  In the ED, afebrile, heart rate in 120s, blood pressure elevated as high as 200, breathing on room air. While in the ED, she had an episode of desaturation to 84% and was started on 2 L of oxygen by nasal cannula. Creatinine 3.39, dialysis dependent,  lactic acid level normal, WBC count not elevated Urinalysis negative for UTI Patient refused CT scan stating she had multiple CT scans in the past and her symptoms she believes are due to flareup of gastroparesis. She was tried on symptomatic management in the ED with IV antiemetics but symptoms persisted. Admitted to [facility] because of persistent vomiting See below for chills  Subjective: Patient was seen and examined this morning.  Pleasant middle-aged African-American female.  Propped up in bed.   Feels better than presentation.  Able to tolerate soft diet.    Assessment and plan: Intractable nausea and vomiting  Acute flare of gastroparesis  Started on symptomatic management with IV Protonix, IV Compazine, IV Ativan as needed, IV hydration.  She states IV Reglan had worked for her in the past but it was stopped by her GI doctor at [facility] because of tremors, which I presume  as extrapyramidal side effect. Currently tolerating soft diet.  Recommend to continue same at home.  Continue as needed antiemetics at home.  Type 1 diabetes mellitus A1c 7.5 on 10/05/2022 PTA on insulin pump which is currently on hold Resume resume post discharge. Continue Lyrica for diabetic neuropathy Recent Labs  Lab 10/22/22 1555 10/22/22 2321 10/23/22 0732 10/23/22 1122 10/23/22 1830  GLUCAP 128* 211* 199* 162* 168*   Uncontrolled hypertension Chronic combined systolic and diastolic CHF Blood pressure elevated as high as 200s in the ED.  Partly because of acute distress Last echo from 10/05/2022 with EF 40 to 45%, LV global hypokinesis, mild concentric LVH, grade 3 diastolic dysfunction, moderately elevated pulmonary artery systolic pressure and moderately dilated both atria  PTA on metoprolol 50 mg daily, Isordil 20 mg 3 times daily, hydralazine 100 mg 3 times daily, amlodipine 10 mg daily. Continue as before.  ESRD-HD-MWF Electrolytes stable Nephrology consult appreciated.  Underwent dialysis yesterday and today.  Resume dialysis on Friday Recent Labs  Lab 10/21/22 1107 10/21/22 2146 10/22/22 0130 10/23/22 0204  NA 141 143 141 134*  K 4.3 4.0 4.1 4.4  CL 104 108 108 98  CO2 26 27 25 29   GLUCOSE 217* 107* 89 201*  BUN 22* 21* 21* 13  CREATININE 3.39* 3.67* 3.81* 2.78*  CALCIUM 9.9 9.0 8.8* 8.1*  MG  --  1.6*  --   --   PHOS  --  4.2  --   --    Elevated Alkphos RUQ ultrasound showed hepatomegaly Repeat LFTs showed an improvement in alk phos.  CAD/HLD Continue aspirin.  Plan to resume statin at discharge  Anxiety/Depression  PTA on hydroxyzine 50 mg 3 times daily,   Anemia of chronic disease Baseline hemoglobin close to 9.  Remains stable.  Continue to monitor Recent Labs    04/08/22 1800 04/09/22 0022 07/13/22 0825 07/14/22 0041 10/07/22 0256 10/07/22 0754 10/15/22 [id number] 10/21/22 1107 10/21/22 2146 10/22/22 0130 10/23/22 0204  HGB  --    < >  --     < > 7.2*   < > 8.2* 10.7* 9.7* 8.9* 8.4*  MCV  --    < >  --    < > 87.7   < > 89.3 89.1 88.0 88.6 90.8  VITAMINB12  --   --  378  --  547  --   --   --   --   --   --   FOLATE  --   --  8.6  --  13.4  --   --   --   --   --   --   FERRITIN 108  --  134  --  933*  --   --   --   --   --   --   TIBC 218*  --  221*  --  167*  --   --   --   --   --   --   IRON 32  --  29  --  14*  --   --   --   --   --   --   RETICCTPCT  --   --  2.6  --   --   --   --   --   --   --   --    < > = values in this interval not displayed.   Mobility: Encourage ambulation.  Goals of care   Code Status: Prior   Wounds:  - Pressure Injury 10/21/22 Sacrum Medial Stage 2 -  Partial thickness loss of dermis presenting as a shallow open injury with a red, pink wound bed without slough. red, small (Active)  Date First Assessed/Time First Assessed: 10/21/22 1854   Location: Sacrum  Location Orientation: Medial  Staging: Stage 2 -  Partial thickness loss of dermis presenting as a shallow open injury with a red, pink wound bed without slough.  Wound Descrip...    Assessments 10/21/2022  6:40 PM 10/23/2022  7:14 AM  Dressing Type -- Foam - Lift dressing to assess site every shift  Drainage Amount None None  Treatment Cleansed --     No associated orders.    Discharge Exam:   Vitals:   10/23/22 1700 10/23/22 1730 10/23/22 1807 10/23/22 1840  BP: (!) 180/74 (!) 180/82 (!) 180/79 (!) 165/70  Pulse: 88 92 92 91  Resp: (!) 23 19 (!) 21 20  Temp:   98.2 F (36.8 C)   TempSrc:   Oral   SpO2: 100% 100% 100% (!) 80%  Weight:   79.7 kg   Height:        Body mass index is 23.83 kg/m.  General exam: Pleasant, middle-aged African-American female.  Not in active pain Skin: No rashes, lesions or ulcers. HEENT: Atraumatic, normocephalic, no obvious bleeding Lungs: Clear to auscultation bilaterally CVS: Regular rate and rhythm, no murmur GI/Abd soft, mild epigastric tenderness present, nondistended, bowel sound  present CNS: Alert, awake, oriented x 3 Psychiatry: Sad affect Extremities: No pedal edema, no calf tenderness  Follow ups:    Follow-up Information  [place], [place], [place] Follow up.   Specialty: Internal Medicine Contact information: [address] [place] [address] [phone]         [name], [place] Follow up.   Specialty: Internal Medicine Contact information: [address] [place] [address] [phone]                 Discharge Instructions:   Discharge Instructions     Call MD for:  difficulty breathing, headache or visual disturbances   Complete by: As directed    Call MD for:  extreme fatigue   Complete by: As directed    Call MD for:  hives   Complete by: As directed    Call MD for:  persistant dizziness or light-headedness   Complete by: As directed    Call MD for:  persistant nausea and vomiting   Complete by: As directed    Call MD for:  severe uncontrolled pain   Complete by: As directed    Call MD for:  temperature >100.4   Complete by: As directed    Diet general   Complete by: As directed    Discharge instructions   Complete by: As directed    Recommendations at discharge:   Recommended small consistent meals.    Judicious use of as needed antiemetics  General discharge instructions: Follow with Primary MD [name], PA in 7 days  Please request your PCP  to go over your hospital tests, procedures, radiology results at the follow up. Please get your medicines reviewed and adjusted.  Your PCP may decide to repeat certain labs or tests as needed. Do not drive, operate heavy machinery, perform activities at heights, swimming or participation in water activities or provide baby sitting services if your were admitted for syncope or siezures until you have seen by Primary MD or a Neurologist and advised to do so again. [place] Controlled Substance Reporting System database was  reviewed. Do not drive, operate heavy machinery, perform activities at heights, swim, participate in water activities or provide baby-sitting services while on medications for pain, sleep and mood until your outpatient physician has reevaluated you and advised to do so again.  You are strongly recommended to comply with the dose, frequency and duration of prescribed medications. Activity: As tolerated with Full fall precautions use walker/cane & assistance as needed Avoid using any recreational substances like cigarette, tobacco, alcohol, or non-prescribed drug. If you experience worsening of your admission symptoms, develop shortness of breath, life threatening emergency, suicidal or homicidal thoughts you must seek medical attention immediately by calling 911 or calling your MD immediately  if symptoms less severe. You must read complete instructions/literature along with all the possible adverse reactions/side effects for all the medicines you take and that have been prescribed to you. Take any new medicine only after you have completely understood and accepted all the possible adverse reactions/side effects.  Wear Seat belts while driving. You were cared for by a hospitalist during your hospital stay. If you have any questions about your discharge medications or the care you received while you were in the hospital after you are discharged, you can call the unit and ask to speak with the hospitalist or the covering physician. Once you are discharged, your primary care physician will handle any further medical issues. Please note that NO REFILLS for any discharge medications will be authorized once you are discharged, as it is imperative that you return to  your primary care physician (or establish a relationship with a primary care physician if you do not have one).   Discharge wound care:   Complete by: As directed    Increase activity slowly   Complete by: As directed        Discharge Medications:    Allergies as of 10/23/2022       Reactions   Atorvastatin Other (See Comments)   Dizziness    Dulaglutide Nausea And Vomiting, Other (See Comments)   Caused pancreatitis    Metoclopramide Other (See Comments)   Possible movement disorder or tardive dyskinesia while on reglan   Other Itching   Peas and carrots   Sertraline Nausea And Vomiting   Zofran [ondansetron] Nausea And Vomiting, Other (See Comments)   Causes nausea vomiting to worsen / doesn't really work for patient.   Amoxicillin Itching, Rash   Lantus [insulin Glargine] Itching, Rash   Has tolerated insulin glargine many times with no reported issues   Miconazole Nitrate Nausea And Vomiting, Rash        Medication List     STOP taking these medications    oxyCODONE-acetaminophen 5-325 MG tablet Commonly known as: Percocet       TAKE these medications    acetaminophen 500 MG tablet Commonly known as: TYLENOL Take 1,000 mg by mouth as needed for moderate pain or mild pain.   albuterol 108 (90 Base) MCG/ACT inhaler Commonly known as: ProAir HFA Inhale 2 puffs into the lungs every 6 (six) hours as needed for wheezing or shortness of breath. What changed: when to take this   amLODipine 10 MG tablet Commonly known as: NORVASC Take 1 tablet (10 mg total) by mouth daily.   Aspirin Low Dose 81 MG tablet Generic drug: aspirin EC Take 1 tablet (81 mg total) by mouth daily. Swallow whole.   Ensure Max Protein Liqd Take 330 mLs (11 oz total) by mouth daily.   hydrALAZINE 100 MG tablet Commonly known as: APRESOLINE Take 1 tablet (100 mg total) by mouth every 8 (eight) hours.   hydrOXYzine 50 MG tablet Commonly known as: ATARAX Take 50 mg by mouth in the morning, at noon, and at bedtime.   insulin aspart 100 UNIT/ML injection Commonly known as: novoLOG Inject 150 Units into the skin See admin instructions. Patient uses this with the [place]. Units vary according to blood sugar. Loads 150 units every 3  days.   isosorbide dinitrate 20 MG tablet Commonly known as: ISORDIL Take 1 tablet (20 mg total) by mouth 3 (three) times daily.   LORazepam 0.5 MG tablet Commonly known as: ATIVAN Take 1 tablet (0.5 mg total) by mouth every 8 (eight) hours as needed for anxiety (Refractory nausea and vomiting). What changed: when to take this   metoprolol succinate 50 MG 24 hr tablet Commonly known as: TOPROL-XL Take 1 tablet (50 mg total) by mouth daily. Take with or immediately following a meal.   nitroGLYCERIN 0.4 MG SL tablet Commonly known as: NITROSTAT Place 1 tablet (0.4 mg total) under the tongue every 5 (five) minutes as needed for chest pain.   pantoprazole 40 MG tablet Commonly known as: Protonix Take 1 tablet (40 mg total) by mouth 2 (two) times daily before a meal.   polyethylene glycol 17 g packet Commonly known as: MIRALAX / GLYCOLAX Take 17 g by mouth daily as needed for moderate constipation.   pravastatin 40 MG tablet Commonly known as: PRAVACHOL Take 1 tablet (40 mg total) by mouth every  evening.   pregabalin 50 MG capsule Commonly known as: LYRICA Take 50 mg by mouth as needed (neuropathy).   prochlorperazine 10 MG tablet Commonly known as: COMPAZINE Take 1 tablet (10 mg total) by mouth every 6 (six) hours as needed for up to 7 days for nausea or vomiting. What changed: when to take this   promethazine 25 MG suppository Commonly known as: PHENERGAN Unwrap and place 1 suppository (25 mg total) rectally every 6 (six) hours as needed for nausea or vomiting. What changed:  when to take this Another medication with the same name was removed. Continue taking this medication, and follow the directions you see here.   scopolamine 1 MG/3DAYS Commonly known as: TRANSDERM-SCOP Place 1 patch (1.5 mg total) onto the skin every 3 (three) days.   tobramycin 0.3 % ophthalmic solution Commonly known as: TOBREX Place 1-2 drops into both eyes See admin instructions. Instill 1-2  drops into both eyes 3 times daily the day before, day of, and the day after every 6 weeks eye injections per patient               Discharge Care Instructions  (From admission, onward)           Start     Ordered   10/23/22 0000  Discharge wound care:        10/23/22 1350             The results of significant diagnostics from this hospitalization (including imaging, microbiology, ancillary and laboratory) are listed below for reference.    Procedures and Diagnostic Studies:   US Abdomen Limited RUQ (LIVER/GB)  Result Date: 10/21/2022 CLINICAL DATA:  Hypoxia. EXAM: ULTRASOUND ABDOMEN LIMITED RIGHT UPPER QUADRANT COMPARISON:  None Available. FINDINGS: Gallbladder: No gallstones or wall thickening visualized (2.7 mm. No sonographic Murphy sign noted by sonographer. Common bile duct: Diameter: 2.0 mm Liver: The liver is enlarged and measures 19.28 cm in length. No focal lesion identified. Within normal limits in parenchymal echogenicity. Portal vein is patent on color Doppler imaging with normal direction of blood flow towards the liver. Other: Of incidental note is the presence of a small right pleural effusion. IMPRESSION: 1. Hepatomegaly. 2. Small right pleural effusion. Electronically Signed   By: Virgina Norfolk M.D.   On: 10/21/2022 23:25   DG CHEST PORT 1 VIEW  Result Date: 10/21/2022 CLINICAL DATA:  Hypoxemia EXAM: PORTABLE CHEST 1 VIEW COMPARISON:  10/08/2022 FINDINGS: Right dialysis catheter and left AICD remain in place, unchanged. Heart borderline in size. Mild vascular congestion. Left lower lobe airspace opacity. Right lung clear. No effusions or acute bony abnormality. IMPRESSION: Borderline cardiomegaly, vascular congestion. Left lower lobe opacity could reflect pneumonia. Electronically Signed   By: Rolm Baptise M.D.   On: 10/21/2022 21:27     Labs:   Basic Metabolic Panel: Recent Labs  Lab 10/21/22 1107 10/21/22 2146 10/22/22 0130 10/23/22 0204  NA  141 143 141 134*  K 4.3 4.0 4.1 4.4  CL 104 108 108 98  CO2 26 27 25 29   GLUCOSE 217* 107* 89 201*  BUN 22* 21* 21* 13  CREATININE 3.39* 3.67* 3.81* 2.78*  CALCIUM 9.9 9.0 8.8* 8.1*  MG  --  1.6*  --   --   PHOS  --  4.2  --   --    GFR Estimated Creatinine Clearance: 27.3 mL/min (A) (by C-G formula based on SCr of 2.78 mg/dL (H)). Liver Function Tests: Recent Labs  Lab 10/21/22 1107 10/21/22  2146 10/22/22 0130  AST 13* 15 13*  ALT 12 14 11   ALKPHOS 129* 93 98  BILITOT 1.2 1.5* 1.3*  PROT 8.1 6.5 6.0*  ALBUMIN 4.1 2.8* 2.5*   Recent Labs  Lab 10/21/22 1107  LIPASE 10*   No results for input(s): "AMMONIA" in the last 168 hours. Coagulation profile No results for input(s): "INR", "PROTIME" in the last 168 hours.  CBC: Recent Labs  Lab 10/21/22 1107 10/21/22 2146 10/22/22 0130 10/23/22 0204  WBC 9.7 7.4 7.6 6.2  NEUTROABS 8.3*  --   --  3.8  HGB 10.7* 9.7* 8.9* 8.4*  HCT 34.3* 30.1* 27.3* 26.7*  MCV 89.1 88.0 88.6 90.8  PLT 319 267 273 252   Cardiac Enzymes: No results for input(s): "CKTOTAL", "CKMB", "CKMBINDEX", "TROPONINI" in the last 168 hours. BNP: Invalid input(s): "POCBNP" CBG: Recent Labs  Lab 10/22/22 1555 10/22/22 2321 10/23/22 0732 10/23/22 1122 10/23/22 1830  GLUCAP 128* 211* 199* 162* 168*   D-Dimer No results for input(s): "DDIMER" in the last 72 hours. Hgb A1c No results for input(s): "HGBA1C" in the last 72 hours. Lipid Profile No results for input(s): "CHOL", "HDL", "LDLCALC", "TRIG", "CHOLHDL", "LDLDIRECT" in the last 72 hours. Thyroid function studies Recent Labs    10/21/22 2146  TSH 1.587   Anemia work up No results for input(s): "VITAMINB12", "FOLATE", "FERRITIN", "TIBC", "IRON", "RETICCTPCT" in the last 72 hours. Microbiology Recent Results (from the past 240 hour(s))  Blood culture (routine x 2)     Status: None (Preliminary result)   Collection Time: 10/21/22 11:07 AM   Specimen: BLOOD  Result Value Ref Range  Status   Specimen Description   Final    BLOOD RIGHT ANTECUBITAL Performed at [facility], [place] [address] [place], [place] [address]    Special Requests   Final    BOTTLES DRAWN AEROBIC AND ANAEROBIC Blood Culture adequate volume Performed at [facility], [address] [place], [place] [address]    Culture   Final    NO GROWTH 2 DAYS Performed at [facility], [address], [place], [place] [address]    Report Status PENDING  Incomplete  Blood culture (routine x 2)     Status: None (Preliminary result)   Collection Time: 10/21/22  6:56 PM   Specimen: BLOOD  Result Value Ref Range Status   Specimen Description BLOOD RIGHT ANTECUBITAL  Final   Special Requests   Final    BOTTLES DRAWN AEROBIC AND ANAEROBIC Blood Culture results may not be optimal due to an inadequate volume of blood received in culture bottles   Culture   Final    NO GROWTH 2 DAYS Performed at [facility], [place] [address], [place], [place] [address]    Report Status PENDING  Incomplete    Time coordinating discharge: 45 minutes  Signed: Hajime Asfaw  [facility] 10/24/2022, 8:23 AM

## 2022-10-23 NOTE — Progress Notes (Signed)
Pt receives out-pt HD at [facility] 10:45 chair time. Contacted attending and nephrologist regarding pt's appropriateness for out-pt HD later today or tomorrow to avoid inpt HD today. Clinic able to treat pt today if pt able to arrive by 12:45. Clinic does not have availability for tomorrow. Spoke to pt via phone regarding going to out-pt HD for treatment today. Pt states she does not have transportation to go to treatment out-pt today. Pt will require inpt HD due to above reasons. Will contact clinic to make them aware of pt's d/c today and pt should resume care on Friday.    Melven Sartorius Renal Navigator [phone]

## 2022-10-23 NOTE — Inpatient Diabetes Management (Signed)
Inpatient Diabetes Program Recommendations  AACE/ADA: New Consensus Statement on Inpatient Glycemic Control (2015)  Target Ranges:  Prepandial:   less than 140 mg/dL      Peak postprandial:   less than 180 mg/dL (1-2 hours)      Critically ill patients:  140 - 180 mg/dL   Lab Results  Component Value Date   GLUCAP 199 (H) 10/23/2022   HGBA1C 7.5 (H) 10/05/2022    Review of Glycemic Control  Latest Reference Range & Units 10/22/22 15:55 10/22/22 23:21 10/23/22 07:32  Glucose-Capillary 70 - 99 mg/dL 128 (H) 211 (H) 199 (H)  (H): Data is abnormally high  Diabetes history: type 1 Outpatient Diabetes medications: Omnipod insulin pump with Novolog(Total basal= 24 units), FreeStyle Libre3 CGM  Current orders for Inpatient glycemic control: Novolog 0-9 units TID   Inpatient Diabetes Program Recommendations:     Consider Semglee 8 units QD.   Thanks, Bronson Curb, MSN, RNC-OB Diabetes Coordinator [phone] (8a-5p)

## 2022-10-25 DIAGNOSIS — Z992 Dependence on renal dialysis: Secondary | ICD-10-CM | POA: Diagnosis not present

## 2022-10-25 DIAGNOSIS — N186 End stage renal disease: Secondary | ICD-10-CM | POA: Diagnosis not present

## 2022-10-25 LAB — HEPATITIS B SURFACE ANTIBODY, QUANTITATIVE: Hep B S AB Quant (Post): <3.1 m[IU]/mL — ABNORMAL LOW (ref >9.9)

## 2022-10-26 LAB — CULTURE, BLOOD (ROUTINE X 2)
Culture: NO GROWTH
Culture: NO GROWTH
Special Requests: ADEQUATE

## 2022-10-28 DIAGNOSIS — N186 End stage renal disease: Secondary | ICD-10-CM | POA: Diagnosis not present

## 2022-10-28 DIAGNOSIS — Z992 Dependence on renal dialysis: Secondary | ICD-10-CM | POA: Diagnosis not present

## 2022-10-29 DIAGNOSIS — E113513 Type 2 diabetes mellitus with proliferative diabetic retinopathy with macular edema, bilateral: Secondary | ICD-10-CM | POA: Diagnosis not present

## 2022-10-29 DIAGNOSIS — H2513 Age-related nuclear cataract, bilateral: Secondary | ICD-10-CM | POA: Diagnosis not present

## 2022-10-29 DIAGNOSIS — H35033 Hypertensive retinopathy, bilateral: Secondary | ICD-10-CM | POA: Diagnosis not present

## 2022-10-29 DIAGNOSIS — H43823 Vitreomacular adhesion, bilateral: Secondary | ICD-10-CM | POA: Diagnosis not present

## 2022-10-30 DIAGNOSIS — N186 End stage renal disease: Secondary | ICD-10-CM | POA: Diagnosis not present

## 2022-10-30 DIAGNOSIS — Z992 Dependence on renal dialysis: Secondary | ICD-10-CM | POA: Diagnosis not present

## 2022-10-31 DIAGNOSIS — E1143 Type 2 diabetes mellitus with diabetic autonomic (poly)neuropathy: Secondary | ICD-10-CM | POA: Diagnosis not present

## 2022-10-31 DIAGNOSIS — F419 Anxiety disorder, unspecified: Secondary | ICD-10-CM | POA: Diagnosis not present

## 2022-10-31 DIAGNOSIS — D638 Anemia in other chronic diseases classified elsewhere: Secondary | ICD-10-CM | POA: Diagnosis not present

## 2022-10-31 DIAGNOSIS — N184 Chronic kidney disease, stage 4 (severe): Secondary | ICD-10-CM | POA: Diagnosis not present

## 2022-11-01 DIAGNOSIS — N186 End stage renal disease: Secondary | ICD-10-CM | POA: Diagnosis not present

## 2022-11-01 DIAGNOSIS — Z992 Dependence on renal dialysis: Secondary | ICD-10-CM | POA: Diagnosis not present

## 2022-11-03 DIAGNOSIS — Z992 Dependence on renal dialysis: Secondary | ICD-10-CM | POA: Diagnosis not present

## 2022-11-03 DIAGNOSIS — N186 End stage renal disease: Secondary | ICD-10-CM | POA: Diagnosis not present

## 2022-11-03 DIAGNOSIS — I129 Hypertensive chronic kidney disease with stage 1 through stage 4 chronic kidney disease, or unspecified chronic kidney disease: Secondary | ICD-10-CM | POA: Diagnosis not present

## 2022-11-04 ENCOUNTER — Emergency Department (HOSPITAL_BASED_OUTPATIENT_CLINIC_OR_DEPARTMENT_OTHER): Payer: BC Managed Care – PPO

## 2022-11-04 ENCOUNTER — Observation Stay (HOSPITAL_BASED_OUTPATIENT_CLINIC_OR_DEPARTMENT_OTHER)
Admission: EM | Admit: 2022-11-04 | Discharge: 2022-11-05 | Disposition: A | Discharge status: Home / Self Care | Payer: BC Managed Care – PPO | Attending: Internal Medicine | Admitting: Internal Medicine

## 2022-11-04 ENCOUNTER — Other Ambulatory Visit: Payer: Self-pay

## 2022-11-04 ENCOUNTER — Encounter (HOSPITAL_BASED_OUTPATIENT_CLINIC_OR_DEPARTMENT_OTHER): Payer: Self-pay | Admitting: Emergency Medicine

## 2022-11-04 DIAGNOSIS — E1069 Type 1 diabetes mellitus with other specified complication: Secondary | ICD-10-CM | POA: Diagnosis present

## 2022-11-04 DIAGNOSIS — Z1152 Encounter for screening for COVID-19: Secondary | ICD-10-CM | POA: Insufficient documentation

## 2022-11-04 DIAGNOSIS — D631 Anemia in chronic kidney disease: Secondary | ICD-10-CM | POA: Insufficient documentation

## 2022-11-04 DIAGNOSIS — Z794 Long term (current) use of insulin: Secondary | ICD-10-CM | POA: Diagnosis not present

## 2022-11-04 DIAGNOSIS — I132 Hypertensive heart and chronic kidney disease with heart failure and with stage 5 chronic kidney disease, or end stage renal disease: Secondary | ICD-10-CM | POA: Insufficient documentation

## 2022-11-04 DIAGNOSIS — E782 Mixed hyperlipidemia: Secondary | ICD-10-CM | POA: Diagnosis present

## 2022-11-04 DIAGNOSIS — K3184 Gastroparesis: Secondary | ICD-10-CM | POA: Diagnosis present

## 2022-11-04 DIAGNOSIS — J811 Chronic pulmonary edema: Secondary | ICD-10-CM | POA: Diagnosis not present

## 2022-11-04 DIAGNOSIS — E1040 Type 1 diabetes mellitus with diabetic neuropathy, unspecified: Secondary | ICD-10-CM | POA: Diagnosis present

## 2022-11-04 DIAGNOSIS — J189 Pneumonia, unspecified organism: Secondary | ICD-10-CM | POA: Insufficient documentation

## 2022-11-04 DIAGNOSIS — Z992 Dependence on renal dialysis: Secondary | ICD-10-CM | POA: Diagnosis not present

## 2022-11-04 DIAGNOSIS — I120 Hypertensive chronic kidney disease with stage 5 chronic kidney disease or end stage renal disease: Secondary | ICD-10-CM | POA: Diagnosis not present

## 2022-11-04 DIAGNOSIS — N186 End stage renal disease: Secondary | ICD-10-CM | POA: Diagnosis present

## 2022-11-04 DIAGNOSIS — E1022 Type 1 diabetes mellitus with diabetic chronic kidney disease: Secondary | ICD-10-CM | POA: Insufficient documentation

## 2022-11-04 DIAGNOSIS — I5042 Chronic combined systolic (congestive) and diastolic (congestive) heart failure: Secondary | ICD-10-CM | POA: Diagnosis not present

## 2022-11-04 DIAGNOSIS — J45909 Unspecified asthma, uncomplicated: Secondary | ICD-10-CM | POA: Insufficient documentation

## 2022-11-04 DIAGNOSIS — I700 Atherosclerosis of aorta: Secondary | ICD-10-CM | POA: Diagnosis not present

## 2022-11-04 DIAGNOSIS — R112 Nausea with vomiting, unspecified: Principal | ICD-10-CM | POA: Diagnosis present

## 2022-11-04 DIAGNOSIS — Z7982 Long term (current) use of aspirin: Secondary | ICD-10-CM | POA: Diagnosis not present

## 2022-11-04 DIAGNOSIS — E1043 Type 1 diabetes mellitus with diabetic autonomic (poly)neuropathy: Secondary | ICD-10-CM | POA: Diagnosis not present

## 2022-11-04 DIAGNOSIS — I3139 Other pericardial effusion (noninflammatory): Secondary | ICD-10-CM | POA: Diagnosis not present

## 2022-11-04 DIAGNOSIS — I10 Essential (primary) hypertension: Secondary | ICD-10-CM | POA: Diagnosis present

## 2022-11-04 DIAGNOSIS — D638 Anemia in other chronic diseases classified elsewhere: Secondary | ICD-10-CM | POA: Diagnosis present

## 2022-11-04 DIAGNOSIS — I2510 Atherosclerotic heart disease of native coronary artery without angina pectoris: Secondary | ICD-10-CM | POA: Diagnosis present

## 2022-11-04 DIAGNOSIS — Z79899 Other long term (current) drug therapy: Secondary | ICD-10-CM | POA: Diagnosis not present

## 2022-11-04 DIAGNOSIS — F39 Unspecified mood [affective] disorder: Secondary | ICD-10-CM | POA: Diagnosis present

## 2022-11-04 LAB — CBC WITH DIFFERENTIAL/PLATELET
Abs Immature Granulocytes: 0.04 10*3/uL (ref 0.00–0.07)
Basophils Absolute: 0 10*3/uL (ref 0.0–0.1)
Basophils Relative: 1 %
Eosinophils Absolute: 0.1 10*3/uL (ref 0.0–0.5)
Eosinophils Relative: 1 %
HCT: 35.2 % — ABNORMAL LOW (ref 36.0–46.0)
Hemoglobin: 11.5 g/dL — ABNORMAL LOW (ref 12.0–15.0)
Immature Granulocytes: 1 %
Lymphocytes Relative: 20 %
Lymphs Abs: 1.5 10*3/uL (ref 0.7–4.0)
MCH: 29.3 pg (ref 26.0–34.0)
MCHC: 32.7 g/dL (ref 30.0–36.0)
MCV: 89.8 fL (ref 80.0–100.0)
Monocytes Absolute: 0.3 10*3/uL (ref 0.1–1.0)
Monocytes Relative: 4 %
Neutro Abs: 5.5 10*3/uL (ref 1.7–7.7)
Neutrophils Relative %: 73 %
Platelets: 189 10*3/uL (ref 150–400)
RBC: 3.92 MIL/uL (ref 3.87–5.11)
RDW: 17.7 % — ABNORMAL HIGH (ref 11.5–15.5)
WBC: 7.6 10*3/uL (ref 4.0–10.5)
nRBC: 0 % (ref 0.0–0.2)

## 2022-11-04 LAB — RESP PANEL BY RT-PCR (RSV, FLU A&B, COVID)  RVPGX2
Influenza A by PCR: NEGATIVE
Influenza B by PCR: NEGATIVE
Resp Syncytial Virus by PCR: NEGATIVE
SARS Coronavirus 2 by RT PCR: NEGATIVE

## 2022-11-04 LAB — LIPASE, BLOOD: Lipase: 11 U/L (ref 11–51)

## 2022-11-04 LAB — COMPREHENSIVE METABOLIC PANEL
ALT: 8 U/L (ref 0–44)
AST: 19 U/L (ref 15–41)
Albumin: 4.3 g/dL (ref 3.5–5.0)
Alkaline Phosphatase: 80 U/L (ref 38–126)
Anion gap: 14 (ref 5–15)
BUN: 30 mg/dL — ABNORMAL HIGH (ref 6–20)
CO2: 24 mmol/L (ref 22–32)
Calcium: 10.3 mg/dL (ref 8.9–10.3)
Chloride: 105 mmol/L (ref 98–111)
Creatinine, Ser: 3.87 mg/dL — ABNORMAL HIGH (ref 0.44–1.00)
GFR, Estimated: 13 mL/min — ABNORMAL LOW (ref >60)
Glucose, Bld: 144 mg/dL — ABNORMAL HIGH (ref 70–99)
Potassium: 4.3 mmol/L (ref 3.5–5.1)
Sodium: 143 mmol/L (ref 135–145)
Total Bilirubin: 1.5 mg/dL — ABNORMAL HIGH (ref 0.3–1.2)
Total Protein: 8.4 g/dL — ABNORMAL HIGH (ref 6.5–8.1)

## 2022-11-04 LAB — GLUCOSE, CAPILLARY: Glucose-Capillary: 224 mg/dL — ABNORMAL HIGH (ref 70–99)

## 2022-11-04 LAB — MRSA NEXT GEN BY PCR, NASAL: MRSA by PCR Next Gen: NOT DETECTED

## 2022-11-04 LAB — CBG MONITORING, ED: Glucose-Capillary: 140 mg/dL — ABNORMAL HIGH (ref 70–99)

## 2022-11-04 MED ORDER — VANCOMYCIN HCL IN DEXTROSE 1-5 GM/200ML-% IV SOLN
1000.0000 mg | Freq: Once | INTRAVENOUS | Status: AC
  Administered 2022-11-04: 1000 mg via INTRAVENOUS
  Filled 2022-11-04: qty 200

## 2022-11-04 MED ORDER — PROCHLORPERAZINE EDISYLATE 10 MG/2ML IJ SOLN: 5.0000 mg | INTRAMUSCULAR | Status: DC | PRN

## 2022-11-04 MED ORDER — PROCHLORPERAZINE EDISYLATE 10 MG/2ML IJ SOLN
5.0000 mg | Freq: Once | INTRAMUSCULAR | Status: AC
  Administered 2022-11-04: 5 mg via INTRAVENOUS
  Filled 2022-11-04: qty 2

## 2022-11-04 MED ORDER — ACETAMINOPHEN 650 MG RE SUPP: 650.0000 mg | Freq: Four times a day (QID) | RECTAL | Status: DC | PRN

## 2022-11-04 MED ORDER — HEPARIN SODIUM (PORCINE) 5000 UNIT/ML IJ SOLN
5000.0000 [IU] | Freq: Three times a day (TID) | INTRAMUSCULAR | Status: DC
  Administered 2022-11-04 – 2022-11-05 (×2): 5000 [IU] via SUBCUTANEOUS
  Filled 2022-11-04 (×2): qty 1

## 2022-11-04 MED ORDER — POLYETHYLENE GLYCOL 3350 17 G PO PACK: 17.0000 g | PACK | Freq: Every day | ORAL | Status: DC | PRN

## 2022-11-04 MED ORDER — CHLORHEXIDINE GLUCONATE CLOTH 2 % EX PADS
6.0000 | MEDICATED_PAD | Freq: Every day | CUTANEOUS | Status: DC
  Administered 2022-11-05: 6 via TOPICAL

## 2022-11-04 MED ORDER — DIPHENHYDRAMINE HCL 50 MG/ML IJ SOLN
25.0000 mg | Freq: Once | INTRAMUSCULAR | Status: AC
  Administered 2022-11-04: 25 mg via INTRAVENOUS
  Filled 2022-11-04: qty 1

## 2022-11-04 MED ORDER — AMLODIPINE BESYLATE 10 MG PO TABS
10.0000 mg | ORAL_TABLET | Freq: Every day | ORAL | Status: DC
  Administered 2022-11-05: 10 mg via ORAL
  Filled 2022-11-04: qty 1

## 2022-11-04 MED ORDER — ALBUTEROL SULFATE (2.5 MG/3ML) 0.083% IN NEBU: 2.5000 mg | INHALATION_SOLUTION | RESPIRATORY_TRACT | Status: DC | PRN

## 2022-11-04 MED ORDER — ASPIRIN 81 MG PO TBEC
81.0000 mg | DELAYED_RELEASE_TABLET | Freq: Every day | ORAL | Status: DC
  Administered 2022-11-05: 81 mg via ORAL
  Filled 2022-11-04: qty 1

## 2022-11-04 MED ORDER — PRAVASTATIN SODIUM 40 MG PO TABS
40.0000 mg | ORAL_TABLET | Freq: Every evening | ORAL | Status: DC
  Administered 2022-11-04: 40 mg via ORAL
  Filled 2022-11-04: qty 1

## 2022-11-04 MED ORDER — LORAZEPAM 0.5 MG PO TABS: 0.5000 mg | ORAL_TABLET | Freq: Four times a day (QID) | ORAL | Status: DC | PRN

## 2022-11-04 MED ORDER — PANTOPRAZOLE SODIUM 40 MG PO TBEC
40.0000 mg | DELAYED_RELEASE_TABLET | Freq: Every day | ORAL | Status: DC
  Administered 2022-11-05: 40 mg via ORAL
  Filled 2022-11-04: qty 1

## 2022-11-04 MED ORDER — ACETAMINOPHEN 325 MG PO TABS: 650.0000 mg | ORAL_TABLET | Freq: Four times a day (QID) | ORAL | Status: DC | PRN

## 2022-11-04 MED ORDER — ISOSORBIDE DINITRATE 10 MG PO TABS
20.0000 mg | ORAL_TABLET | Freq: Three times a day (TID) | ORAL | Status: DC
  Administered 2022-11-04 – 2022-11-05 (×3): 20 mg via ORAL
  Filled 2022-11-04 (×3): qty 2

## 2022-11-04 MED ORDER — PANTOPRAZOLE SODIUM 40 MG IV SOLR
40.0000 mg | Freq: Once | INTRAVENOUS | Status: AC
  Administered 2022-11-04: 40 mg via INTRAVENOUS
  Filled 2022-11-04: qty 10

## 2022-11-04 MED ORDER — SODIUM CHLORIDE 0.9 % IV SOLN
2.0000 g | Freq: Once | INTRAVENOUS | Status: AC
  Administered 2022-11-04: 2 g via INTRAVENOUS
  Filled 2022-11-04: qty 12.5

## 2022-11-04 MED ORDER — VANCOMYCIN HCL 500 MG IV SOLR: 500.0000 mg | Freq: Once | INTRAVENOUS | Status: DC

## 2022-11-04 MED ORDER — SODIUM CHLORIDE 0.9 % IV SOLN: INTRAVENOUS | Status: DC | PRN

## 2022-11-04 MED ORDER — HYDRALAZINE HCL 50 MG PO TABS
100.0000 mg | ORAL_TABLET | Freq: Three times a day (TID) | ORAL | Status: DC
  Administered 2022-11-04 – 2022-11-05 (×3): 100 mg via ORAL
  Filled 2022-11-04 (×3): qty 2

## 2022-11-04 MED ORDER — INSULIN ASPART 100 UNIT/ML IJ SOLN
0.0000 [IU] | Freq: Three times a day (TID) | INTRAMUSCULAR | Status: DC
  Administered 2022-11-05 (×2): 1 [IU] via SUBCUTANEOUS

## 2022-11-04 MED ORDER — METOPROLOL TARTRATE 5 MG/5ML IV SOLN
5.0000 mg | Freq: Once | INTRAVENOUS | Status: AC
  Administered 2022-11-04: 5 mg via INTRAVENOUS
  Filled 2022-11-04: qty 5

## 2022-11-04 MED ORDER — LORAZEPAM 2 MG/ML IJ SOLN
1.0000 mg | Freq: Once | INTRAMUSCULAR | Status: AC
  Administered 2022-11-04: 1 mg via INTRAVENOUS
  Filled 2022-11-04: qty 1

## 2022-11-04 MED ORDER — METOPROLOL SUCCINATE ER 50 MG PO TB24
50.0000 mg | ORAL_TABLET | Freq: Every day | ORAL | Status: DC
  Administered 2022-11-05: 50 mg via ORAL
  Filled 2022-11-04: qty 1

## 2022-11-04 MED ORDER — SODIUM CHLORIDE 0.9 % IV SOLN: 1.0000 g | Freq: Once | INTRAVENOUS | Status: DC

## 2022-11-04 MED ORDER — HYDRALAZINE HCL 20 MG/ML IJ SOLN
20.0000 mg | Freq: Once | INTRAMUSCULAR | Status: AC
  Administered 2022-11-04: 20 mg via INTRAVENOUS
  Filled 2022-11-04: qty 1

## 2022-11-04 MED ORDER — LORAZEPAM 2 MG/ML IJ SOLN
0.5000 mg | Freq: Once | INTRAMUSCULAR | Status: AC
  Administered 2022-11-04: 0.5 mg via INTRAVENOUS
  Filled 2022-11-04: qty 1

## 2022-11-04 MED ORDER — VANCOMYCIN HCL 750 MG/150ML IV SOLN
750.0000 mg | INTRAVENOUS | Status: DC
  Filled 2022-11-04: qty 150

## 2022-11-04 MED ORDER — SODIUM CHLORIDE 0.9% FLUSH
3.0000 mL | Freq: Two times a day (BID) | INTRAVENOUS | Status: DC
  Administered 2022-11-04 – 2022-11-05 (×2): 3 mL via INTRAVENOUS

## 2022-11-04 NOTE — ED Notes (Signed)
ED TO INPATIENT HANDOFF REPORT  ED Nurse Name and Phone #: Barbaraann Cao [phone]  S Name/Age/Gender [name] 52 y.o. female Room/Bed: DB014/DB014  Code Status   Code Status: Prior  Home/SNF/Other Admit [facility] Patient oriented to: self, place, time, and situation Is this baseline? Yes   Triage Complete: Triage complete  Chief Complaint Intractable nausea and vomiting [R11.2]  Triage Note Pt arrives pov, to triage in wheelchair with c/o emesis since last night. Hx of gastroparesis. Pt vomiting in triage. Was not able to make it to dialysis this morning   Allergies Allergies  Allergen Reactions   Atorvastatin Other (See Comments)    Dizziness    Dulaglutide Nausea And Vomiting and Other (See Comments)    Caused pancreatitis    Metoclopramide Other (See Comments)    Possible movement disorder or tardive dyskinesia while on reglan   Other Itching    Peas and carrots   Sertraline Nausea And Vomiting   Zofran [Ondansetron] Nausea And Vomiting and Other (See Comments)    Causes nausea vomiting to worsen / doesn't really work for patient.   Amoxicillin Itching and Rash   Lantus [Insulin Glargine] Itching and Rash    Has tolerated insulin glargine many times with no reported issues   Miconazole Nitrate Nausea And Vomiting and Rash    Level of Care/Admitting Diagnosis ED Disposition     ED Disposition  Admit   Condition  --   Comment  Hospital Area: [place] [[id number]]  Level of Care: Telemetry Medical [104]  Interfacility transfer: Yes  May place patient in observation at [facility] or [place] if equivalent level of care is available:: No  Covid Evaluation: Symptomatic Person Under Investigation (PUI) or recent exposure (last 10 days) *Testing Required*  Diagnosis: Intractable nausea and vomiting [id number]  Admitting Physician: Marcelyn Bruins [id number]  Attending Physician: Marcelyn Bruins [id number]           B Medical/Surgery History Past Medical History:  Diagnosis Date   Anemia    Anxiety    Arthritis    Asthma    CHF (congestive heart failure)    Depression    Diabetic ulcer of left foot 05/12/2013   ESRD on hemodialysis    MWF at [facility]   Gastroparesis    Generalized abdominal pain    History of chicken pox    Loss of weight 12/02/2019   Migraines    Mixed hyperlipidemia 09/05/2022   Mood disorder    anxiety   Myocardial infarction    Prurigo nodularis    with diabetic dermopathy   Type 1 diabetes, uncontrolled, with neuropathy    [place]   Ulcers of both lower legs 02/20/2014   Past Surgical History:  Procedure Laterality Date   AV FISTULA PLACEMENT Left 10/01/2022   Procedure: ARTERIOVENOUS (AV)FISTULA CREATION;  Surgeon: Rosetta Posner, MD;  Location: [facility];  Service: Vascular;  Laterality: Left;   BIOPSY  08/11/2020   Procedure: BIOPSY;  Surgeon: Ladene Artist, MD;  Location: [facility];  Service: Endoscopy;;   BIOPSY  04/02/2022   Procedure: BIOPSY;  Surgeon: Jackquline Denmark, MD;  Location: [facility];  Service: Gastroenterology;;   ESOPHAGOGASTRODUODENOSCOPY (EGD) WITH PROPOFOL N/A 08/11/2020   Procedure: ESOPHAGOGASTRODUODENOSCOPY (EGD) WITH PROPOFOL;  Surgeon: Ladene Artist, MD;  Location: [facility];  Service: Endoscopy;  Laterality: N/A;   ESOPHAGOGASTRODUODENOSCOPY (EGD) WITH PROPOFOL N/A 04/02/2022   Procedure: ESOPHAGOGASTRODUODENOSCOPY (EGD) WITH PROPOFOL;  Surgeon: Jackquline Denmark,  MD;  Location: [facility];  Service: Gastroenterology;  Laterality: N/A;   ESOPHAGOGASTRODUODENOSCOPY (EGD) WITH PROPOFOL N/A 05/23/2022   Procedure: ESOPHAGOGASTRODUODENOSCOPY (EGD) WITH PROPOFOL;  Surgeon: Lucilla Lame, MD;  Location: [facility] ENDOSCOPY;  Service: Endoscopy;  Laterality: N/A;   ICD IMPLANT N/A 04/09/2022   Procedure: ICD IMPLANT;  Surgeon: Constance Haw, MD;  Location: [place] [facility];  Service: Cardiovascular;  Laterality: N/A;   IR FLUORO GUIDE  CV LINE RIGHT  10/07/2022   IR US GUIDE VASC ACCESS RIGHT  10/07/2022   treadmill stress test  01/2013   WNL, low risk study     A IV Location/Drains/Wounds Patient Lines/Drains/Airways Status     Active Line/Drains/Airways     Name Placement date Placement time Site Days   Peripheral IV 10/21/22 18 G 1.88" Anterior;Proximal;Right Forearm 10/21/22  1108  Forearm  14   Peripheral IV 11/04/22 Anterior;Right;Upper;Medial;Distal Arm 11/04/22  0734  Arm  less than 1   Peripheral IV 11/04/22 22 G Posterior;Right Hand 11/04/22  0739  Hand  less than 1   Fistula / Graft Left Forearm Arteriovenous fistula 10/01/22  1235  Forearm  34   Hemodialysis Catheter Right Subclavian Double lumen Permanent (Tunneled) 10/07/22  1458  Subclavian  28   Pressure Injury 10/21/22 Sacrum Medial Stage 2 -  Partial thickness loss of dermis presenting as a shallow open injury with a red, pink wound bed without slough. red, small 10/21/22  1854  -- 14            Intake/Output Last 24 hours No intake or output data in the 24 hours ending 11/04/22 1420  Labs/Imaging Results for orders placed or performed during the hospital encounter of 11/04/22 (from the past 48 hour(s))  CBC with Differential/Platelet     Status: Abnormal   Collection Time: 11/04/22  7:33 AM  Result Value Ref Range   WBC 7.6 4.0 - 10.5 K/uL   RBC 3.92 3.87 - 5.11 MIL/uL   Hemoglobin 11.5 (L) 12.0 - 15.0 g/dL   HCT 35.2 (L) 36.0 - 46.0 %   MCV 89.8 80.0 - 100.0 fL   MCH 29.3 26.0 - 34.0 pg   MCHC 32.7 30.0 - 36.0 g/dL   RDW 17.7 (H) 11.5 - 15.5 %   Platelets 189 150 - 400 K/uL   nRBC 0.0 0.0 - 0.2 %   Neutrophils Relative % 73 %   Neutro Abs 5.5 1.7 - 7.7 K/uL   Lymphocytes Relative 20 %   Lymphs Abs 1.5 0.7 - 4.0 K/uL   Monocytes Relative 4 %   Monocytes Absolute 0.3 0.1 - 1.0 K/uL   Eosinophils Relative 1 %   Eosinophils Absolute 0.1 0.0 - 0.5 K/uL   Basophils Relative 1 %   Basophils Absolute 0.0 0.0 - 0.1 K/uL   Immature  Granulocytes 1 %   Abs Immature Granulocytes 0.04 0.00 - 0.07 K/uL    Comment: Performed at [facility], [place], [place] [address]  Comprehensive metabolic panel     Status: Abnormal   Collection Time: 11/04/22  7:33 AM  Result Value Ref Range   Sodium 143 135 - 145 mmol/L   Potassium 4.3 3.5 - 5.1 mmol/L   Chloride 105 98 - 111 mmol/L   CO2 24 22 - 32 mmol/L   Glucose, Bld 144 (H) 70 - 99 mg/dL    Comment: Glucose reference range applies only to samples taken after fasting for at least 8 hours.  BUN 30 (H) 6 - 20 mg/dL   Creatinine, Ser 3.87 (H) 0.44 - 1.00 mg/dL   Calcium 10.3 8.9 - 10.3 mg/dL   Total Protein 8.4 (H) 6.5 - 8.1 g/dL   Albumin 4.3 3.5 - 5.0 g/dL   AST 19 15 - 41 U/L   ALT 8 0 - 44 U/L   Alkaline Phosphatase 80 38 - 126 U/L   Total Bilirubin 1.5 (H) 0.3 - 1.2 mg/dL   GFR, Estimated 13 (L) >60 mL/min    Comment: (NOTE) Calculated using the CKD-EPI Creatinine Equation (2021)    Anion gap 14 5 - 15    Comment: Performed at [facility], [place], [place] [address]  Lipase, blood     Status: None   Collection Time: 11/04/22  7:33 AM  Result Value Ref Range   Lipase 11 11 - 51 U/L    Comment: Performed at [facility], [address] [place], [place] [address]  CBG monitoring, ED     Status: Abnormal   Collection Time: 11/04/22  7:35 AM  Result Value Ref Range   Glucose-Capillary 140 (H) 70 - 99 mg/dL    Comment: Glucose reference range applies only to samples taken after fasting for at least 8 hours.  Resp panel by RT-PCR (RSV, Flu A&B, Covid) Anterior Nasal Swab     Status: None   Collection Time: 11/04/22  1:20 PM   Specimen: Anterior Nasal Swab  Result Value Ref Range   SARS Coronavirus 2 by RT PCR NEGATIVE NEGATIVE    Comment: (NOTE) SARS-CoV-2 target nucleic acids are NOT DETECTED.  The SARS-CoV-2 RNA is generally detectable in upper respiratory specimens  during the acute phase of infection. The lowest concentration of SARS-CoV-2 viral copies this assay can detect is 138 copies/mL. A negative result does not preclude SARS-Cov-2 infection and should not be used as the sole basis for treatment or other patient management decisions. A negative result may occur with  improper specimen collection/handling, submission of specimen other than nasopharyngeal swab, presence of viral mutation(s) within the areas targeted by this assay, and inadequate number of viral copies(<138 copies/mL). A negative result must be combined with clinical observations, patient history, and epidemiological information. The expected result is Negative.  Fact Sheet for Patients:  EntrepreneurPulse.com.au  Fact Sheet for Healthcare Providers:  IncredibleEmployment.be  This test is no t yet approved or cleared by the [place] FDA and  has been authorized for detection and/or diagnosis of SARS-CoV-2 by FDA under an Emergency Use Authorization (EUA). This EUA will remain  in effect (meaning this test can be used) for the duration of the COVID-19 declaration under Section 564(b)(1) of the Act, 21 U.S.C.section 360bbb-3(b)(1), unless the authorization is terminated  or revoked sooner.       Influenza A by PCR NEGATIVE NEGATIVE   Influenza B by PCR NEGATIVE NEGATIVE    Comment: (NOTE) The Xpert Xpress SARS-CoV-2/FLU/RSV plus assay is intended as an aid in the diagnosis of influenza from Nasopharyngeal swab specimens and should not be used as a sole basis for treatment. Nasal washings and aspirates are unacceptable for Xpert Xpress SARS-CoV-2/FLU/RSV testing.  Fact Sheet for Patients: EntrepreneurPulse.com.au  Fact Sheet for Healthcare Providers: IncredibleEmployment.be  This test is not yet approved or cleared by the [place] FDA and has been authorized for detection and/or diagnosis  of SARS-CoV-2 by FDA under an Emergency Use Authorization (EUA). This EUA will remain in effect (meaning this test can be  used) for the duration of the COVID-19 declaration under Section 564(b)(1) of the Act, 21 U.S.C. section 360bbb-3(b)(1), unless the authorization is terminated or revoked.     Resp Syncytial Virus by PCR NEGATIVE NEGATIVE    Comment: (NOTE) Fact Sheet for Patients: EntrepreneurPulse.com.au  Fact Sheet for Healthcare Providers: IncredibleEmployment.be  This test is not yet approved or cleared by the [place] FDA and has been authorized for detection and/or diagnosis of SARS-CoV-2 by FDA under an Emergency Use Authorization (EUA). This EUA will remain in effect (meaning this test can be used) for the duration of the COVID-19 declaration under Section 564(b)(1) of the Act, 21 U.S.C. section 360bbb-3(b)(1), unless the authorization is terminated or revoked.  Performed at [facility], [address], [place], [place] [address]    CT Chest Wo Contrast  Result Date: 11/04/2022 CLINICAL DATA:  Pneumonia.  Emesis.  History of gastroparesis EXAM: CT CHEST WITHOUT CONTRAST TECHNIQUE: Multidetector CT imaging of the chest was performed following the standard protocol without IV contrast. RADIATION DOSE REDUCTION: This exam was performed according to the departmental dose-optimization program which includes automated exposure control, adjustment of the mA and/or kV according to patient size and/or use of iterative reconstruction technique. COMPARISON:  Chest x-ray 11/04/2022 earlier and older. CT 04/21/2022 FINDINGS: Cardiovascular: Battery pack seen along the left upper chest with leads extending along the right side of the heart. Right IJ catheter in place as well to the large-bore catheter. Heart itself is nonenlarged but there is small pericardial effusion. On this non IV contrast exam the thoracic aorta has a normal  course and caliber with some vascular calcifications. Mediastinum/Nodes: Patulous esophagus. Heterogeneous thyroid gland. On this non IV contrast exam there is no specific abnormal lymph node enlargement identified in the axillary region, hilum or mediastinum. Lungs/Pleura: No pneumothorax, effusion consolidation. There is some minimal reticular and ground-glass changes in the left lower lobe. Mild apical pleural thickening. Upper Abdomen: Along the upper abdomen the adrenal glands are preserved. Musculoskeletal: Mild degenerative changes. IMPRESSION: Minimal reticular and ground-glass changes along the left lower lobe. No consolidation. Defibrillator.  Right IJ catheter. Small pericardial effusion. Patulous esophagus Aortic Atherosclerosis (ICD10-I70.0). Electronically Signed   By: Jill Side M.D.   On: 11/04/2022 12:18   DG Chest Port 1 View  Result Date: 11/04/2022 CLINICAL DATA:  Dialysis patient, emesis, gastro paresis EXAM: PORTABLE CHEST 1 VIEW COMPARISON:  10/21/2022 FINDINGS: Stable cardiomegaly with central vascular congestion. No CHF pattern or large effusion. Similar left lower lobe airspace opacity with partial obscuration of the left hemidiaphragm may reflect pneumonia or atelectasis. Right IJ dialysis catheter tip SVC RA junction. Left subclavian AICD noted. Trachea midline. No acute osseous finding. IMPRESSION: 1. Cardiomegaly with vascular congestion. 2. Left lower lobe airspace disease/atelectasis versus pneumonia. Electronically Signed   By: Jerilynn Mages.  Shick M.D.   On: 11/04/2022 08:26    Pending Labs Unresulted Labs (From admission, onward)     Start     Ordered   11/04/22 1400  MRSA Next Gen by PCR, Nasal  Once,   R        11/04/22 1400            Vitals/Pain Today's Vitals   11/04/22 1130 11/04/22 1131 11/04/22 1155 11/04/22 1311  BP: (!) 154/143     Pulse: (!) 107     Resp: 18     Temp:  98.1 F (36.7 C)    TempSrc:  Oral    SpO2: 98%  Weight:      PainSc:   0-No  pain 0-No pain    Isolation Precautions Airborne and Contact precautions  Medications Medications  vancomycin (VANCOCIN) IVPB 1000 mg/200 mL premix (has no administration in time range)    Followed by  vancomycin (VANCOCIN) 500 mg in sodium chloride 0.9 % 100 mL IVPB (has no administration in time range)  vancomycin (VANCOREADY) IVPB 750 mg/150 mL (has no administration in time range)  0.9 %  sodium chloride infusion ( Intravenous New Bag/Given 11/04/22 1309)  prochlorperazine (COMPAZINE) injection 5 mg (5 mg Intravenous Given 11/04/22 0813)  LORazepam (ATIVAN) injection 1 mg (1 mg Intravenous Given 11/04/22 0815)  metoprolol tartrate (LOPRESSOR) injection 5 mg (5 mg Intravenous Given 11/04/22 0820)  hydrALAZINE (APRESOLINE) injection 20 mg (20 mg Intravenous Given 11/04/22 0820)  pantoprazole (PROTONIX) injection 40 mg (40 mg Intravenous Given 11/04/22 0814)  LORazepam (ATIVAN) injection 0.5 mg (0.5 mg Intravenous Given 11/04/22 1148)  prochlorperazine (COMPAZINE) injection 5 mg (5 mg Intravenous Given 11/04/22 1147)  ceFEPIme (MAXIPIME) 2 g in sodium chloride 0.9 % 100 mL IVPB (2 g Intravenous New Bag/Given 11/04/22 1310)    Mobility walks with person assist     Focused Assessments Renal Assessment Handoff:  Hemodialysis Schedule:  Last Hemodialysis date and time:    Restricted appendage: left arm   R Recommendations: See Admitting Provider Note  Report given to:   Additional Notes: Call with any questions

## 2022-11-04 NOTE — Progress Notes (Signed)
Pharmacy Antibiotic Note  [name] is a 52 y.o. female admitted on 11/04/2022 presenting N/V, concern for pna.  Pharmacy has been consulted for vancomycin dosing.  Gram negative coverage per MD.  ESRD-HD usually MWF  Plan: Vancomycin 1500 mg IV x 1, then 750 mg qHD Add MRSA PCR Monitor HD schedule, Cx/PCR to narrow Vancomycin random level as needed  Weight: 79.4 kg (175 lb)  Temp (24hrs), Avg:98.4 F (36.9 C), Min:98.1 F (36.7 C), Max:98.6 F (37 C)  Recent Labs  Lab 11/04/22 0733  WBC 7.6  CREATININE 3.87*    Estimated Creatinine Clearance: 19.6 mL/min (A) (by C-G formula based on SCr of 3.87 mg/dL (H)).    Allergies  Allergen Reactions   Atorvastatin Other (See Comments)    Dizziness    Dulaglutide Nausea And Vomiting and Other (See Comments)    Caused pancreatitis    Metoclopramide Other (See Comments)    Possible movement disorder or tardive dyskinesia while on reglan   Other Itching    Peas and carrots   Sertraline Nausea And Vomiting   Zofran [Ondansetron] Nausea And Vomiting and Other (See Comments)    Causes nausea vomiting to worsen / doesn't really work for patient.   Amoxicillin Itching and Rash   Lantus [Insulin Glargine] Itching and Rash    Has tolerated insulin glargine many times with no reported issues   Miconazole Nitrate Nausea And Vomiting and Rash    Bertis Ruddy, PharmD, [facility] Pharmacist ED Pharmacist Phone # [phone] 11/04/2022 12:30 PM

## 2022-11-04 NOTE — ED Notes (Signed)
[name] with [facility] called for transport 

## 2022-11-04 NOTE — H&P (Addendum)
History and Physical   [name] DOB: 05-11-1971 DOA: 11/04/2022  PCP: [name], PA   Patient coming from: Home  Chief Complaint: Nausea vomiting  HPI: [name] is a 52 y.o. female with medical history significant of diabetes type 1, gastroparesis, hyperlipidemia, hypertension, diastolic CHF, mood disorder, anemia, ESRD on HD, CAD presenting with intractable nausea and vomiting.  Patient has known history of diabetes type 1 and gastroparesis with recurrent issues with intractable nausea and vomiting.  Patient was doing okay on Friday and had her dialysis session completed.  However Friday Saturday and Sunday she had intermittent significant nausea vomiting worse in the morning and this morning nausea vomiting became persistent and she was feeling too unwell to go to dialysis and presented to the ED for further evaluation after she was unable to keep medications down.  She denies fevers, chills, chest pain, shortness of breath, abdominal pain.  Has historically responded well to Compazine per chart review.  ED Course: Vital signs in the ED noted to be significant for initial blood pressure in the [id number] systolic with significant reduction with interventions in the ED to the 120s prior to transfer from med center.  Heart rate in the 90s to 120s, respiratory rate in the teens to 20s.  Lab workup included CMP with BUN of 30, creatinine of 3.87 consistent with ESRD, glucose 149, protein 8.4, T. bili 1.5.  CBC with hemoglobin stable 11.5.  Lipase normal.  Chest x-ray showed cardiomegaly with vascular congestion and left lower lobe atelectasis versus pneumonia.  CT chest showed left lower lobe reticular groundglass changes but no evidence of consolidation also demonstrated small pericardial effusion.  Patient received cefepime and vancomycin in the ED as well as metoprolol, hydralazine IV.  Patient received Ativan and Compazine and IV PPI.  Blood pressure and heart rate  improved after receiving blood pressure medication and Ativan.  After receiving dose of vancomycin patient reportedly experienced itching, swelling of the right lobe.  IV Benadryl ordered by EDP.  This was prior to patient's transfer from the med center.  Review of Systems: As per HPI otherwise all other systems reviewed and are negative.  Past Medical History:  Diagnosis Date   Anemia    Anxiety    Arthritis    Asthma    CHF (congestive heart failure)    Chronic diastolic CHF (congestive heart failure) 12/14/2019   Depression    Diabetic ulcer of left foot 05/12/2013   ESRD on hemodialysis    MWF at [facility]   Gastroparesis    Generalized abdominal pain    History of chicken pox    Loss of weight 12/02/2019   Migraines    Mixed hyperlipidemia 09/05/2022   Mood disorder    anxiety   Myocardial infarction    Prurigo nodularis    with diabetic dermopathy   Type 1 diabetes mellitus 05/23/2022   Type 1 diabetes, uncontrolled, with neuropathy    [place]   Ulcers of both lower legs 02/20/2014    Past Surgical History:  Procedure Laterality Date   AV FISTULA PLACEMENT Left 10/01/2022   Procedure: ARTERIOVENOUS (AV)FISTULA CREATION;  Surgeon: Rosetta Posner, MD;  Location: [facility];  Service: Vascular;  Laterality: Left;   BIOPSY  08/11/2020   Procedure: BIOPSY;  Surgeon: Ladene Artist, MD;  Location: [facility];  Service: Endoscopy;;   BIOPSY  04/02/2022   Procedure: BIOPSY;  Surgeon: Jackquline Denmark, MD;  Location: [facility];  Service: Gastroenterology;;  ESOPHAGOGASTRODUODENOSCOPY (EGD) WITH PROPOFOL N/A 08/11/2020   Procedure: ESOPHAGOGASTRODUODENOSCOPY (EGD) WITH PROPOFOL;  Surgeon: Ladene Artist, MD;  Location: [facility];  Service: Endoscopy;  Laterality: N/A;   ESOPHAGOGASTRODUODENOSCOPY (EGD) WITH PROPOFOL N/A 04/02/2022   Procedure: ESOPHAGOGASTRODUODENOSCOPY (EGD) WITH PROPOFOL;  Surgeon: Jackquline Denmark, MD;  Location: [facility];  Service: Gastroenterology;   Laterality: N/A;   ESOPHAGOGASTRODUODENOSCOPY (EGD) WITH PROPOFOL N/A 05/23/2022   Procedure: ESOPHAGOGASTRODUODENOSCOPY (EGD) WITH PROPOFOL;  Surgeon: Lucilla Lame, MD;  Location: [facility];  Service: Endoscopy;  Laterality: N/A;   ICD IMPLANT N/A 04/09/2022   Procedure: ICD IMPLANT;  Surgeon: Constance Haw, MD;  Location: [facility];  Service: Cardiovascular;  Laterality: N/A;   IR FLUORO GUIDE CV LINE RIGHT  10/07/2022   IR US GUIDE VASC ACCESS RIGHT  10/07/2022   treadmill stress test  01/2013   WNL, low risk study    Social History  reports that she has never smoked. She has never used smokeless tobacco. She reports that she does not currently use alcohol. She reports that she does not use drugs.  Allergies  Allergen Reactions   Atorvastatin Other (See Comments)    Dizziness    Dulaglutide Nausea And Vomiting and Other (See Comments)    Caused pancreatitis    Metoclopramide Other (See Comments)    Possible movement disorder or tardive dyskinesia while on reglan   Other Itching    Peas and carrots   Sertraline Nausea And Vomiting   Vancomycin Itching and Swelling   Zofran [Ondansetron] Nausea And Vomiting and Other (See Comments)    Causes nausea vomiting to worsen / doesn't really work for patient.   Amoxicillin Itching and Rash   Lantus [Insulin Glargine] Itching and Rash    Has tolerated insulin glargine many times with no reported issues   Miconazole Nitrate Nausea And Vomiting and Rash    Family History  Problem Relation Age of Onset   Diabetes Mother        type 2   Hypertension Mother    Thyroid disease Mother    Bipolar disorder Mother    Heart disease Mother    Calcium disorder Mother    Cancer Maternal Grandmother        Breast, stomach   Cancer Paternal Grandmother        stomach, lung (smoker)   Diabetes Paternal Grandmother    Diabetes Paternal Grandfather    Heart failure Sister    Diabetes Sister    Stroke Maternal Aunt    Cancer  Maternal Uncle        prostate   CAD Maternal Aunt        stents   Cancer Maternal Aunt 64       ovarian  Reviewed on admission  Prior to Admission medications   Medication Sig Start Date End Date Taking? Authorizing Provider  acetaminophen (TYLENOL) 500 MG tablet Take 1,000 mg by mouth as needed for moderate pain or mild pain.    [provider]  albuterol (PROAIR HFA) 108 (90 Base) MCG/ACT inhaler Inhale 2 puffs into the lungs every 6 (six) hours as needed for wheezing or shortness of breath. Patient taking differently: Inhale 2 puffs into the lungs as needed for wheezing or shortness of breath. 07/03/20   [name], PA-C  amLODipine (NORVASC) 10 MG tablet Take 1 tablet (10 mg total) by mouth daily. 10/16/22 11/15/22  Darliss Cheney, MD  aspirin EC 81 MG tablet Take 1 tablet (81 mg total)  by mouth daily. Swallow whole. 10/16/22   Darliss Cheney, MD  Ensure Max Protein (ENSURE MAX PROTEIN) LIQD Take 330 mLs (11 oz total) by mouth daily. Patient not taking: Reported on 10/09/2022 04/30/22   Modena Jansky, MD  hydrALAZINE (APRESOLINE) 100 MG tablet Take 1 tablet (100 mg total) by mouth every 8 (eight) hours. 10/15/22 11/14/22  Darliss Cheney, MD  hydrOXYzine (ATARAX) 50 MG tablet Take 50 mg by mouth in the morning, at noon, and at bedtime.    [provider]  insulin aspart (NOVOLOG) 100 UNIT/ML injection Inject 150 Units into the skin See admin instructions. Patient uses this with the [place]. Units vary according to blood sugar. Loads 150 units every 3 days.    [provider]  isosorbide dinitrate (ISORDIL) 20 MG tablet Take 1 tablet (20 mg total) by mouth 3 (three) times daily. 10/15/22 11/14/22  Darliss Cheney, MD  LORazepam (ATIVAN) 0.5 MG tablet Take 1 tablet (0.5 mg total) by mouth every 8 (eight) hours as needed for anxiety (Refractory nausea and vomiting). Patient taking differently: Take 0.5 mg by mouth 4 (four) times daily as needed for anxiety (Refractory  nausea and vomiting). 07/17/22   Domenic Polite, MD  metoprolol succinate (TOPROL-XL) 50 MG 24 hr tablet Take 1 tablet (50 mg total) by mouth daily. Take with or immediately following a meal. 09/05/22   Chandrasekhar, Mahesh A, MD  nitroGLYCERIN (NITROSTAT) 0.4 MG SL tablet Place 1 tablet (0.4 mg total) under the tongue every 5 (five) minutes as needed for chest pain. 09/05/22 12/04/22  Werner Lean, MD  pantoprazole (PROTONIX) 40 MG tablet Take 1 tablet (40 mg total) by mouth 2 (two) times daily before a meal. 05/24/22 10/09/22  Rai, Ripudeep K, MD  polyethylene glycol (MIRALAX / GLYCOLAX) 17 g packet Take 17 g by mouth daily as needed for moderate constipation.    [provider]  pravastatin (PRAVACHOL) 40 MG tablet Take 1 tablet (40 mg total) by mouth every evening. 09/05/22   Chandrasekhar, Lyda Kalata A, MD  pregabalin (LYRICA) 50 MG capsule Take 50 mg by mouth as needed (neuropathy). 12/05/21   [provider]  prochlorperazine (COMPAZINE) 10 MG tablet Take 1 tablet (10 mg total) by mouth every 6 (six) hours as needed for up to 7 days for nausea or vomiting. 10/23/22 10/30/22  Dahal, Marlowe Aschoff, MD  promethazine (PHENERGAN) 25 MG suppository Unwrap and place 1 suppository (25 mg total) rectally every 6 (six) hours as needed for nausea or vomiting. Patient taking differently: Place 25 mg rectally as needed for nausea or vomiting. 07/17/22   Domenic Polite, MD  scopolamine (TRANSDERM-SCOP) 1 MG/3DAYS Place 1 patch (1.5 mg total) onto the skin every 3 (three) days. 05/26/22   Rai, Ripudeep K, MD  tobramycin (TOBREX) 0.3 % ophthalmic solution Place 1-2 drops into both eyes See admin instructions. Instill 1-2 drops into both eyes 3 times daily the day before, day of, and the day after every 6 weeks eye injections per patient 11/17/20   [provider]    Physical Exam: Vitals:   11/04/22 1130 11/04/22 1131 11/04/22 1647 11/04/22 1708  BP: (!) 154/143  (!) 173/84   Pulse: (!) 107   (!) 110   Resp: 18  16   Temp:  98.1 F (36.7 C) 98.6 F (37 C)   TempSrc:  Oral Oral   SpO2: 98%  98%   Weight:    79.7 kg  Height:    6' (1.829 m)  Physical Exam Constitutional:      General: She is not in acute distress.    Appearance: Normal appearance.  HENT:     Head: Normocephalic and atraumatic.     Mouth/Throat:     Mouth: Mucous membranes are moist.     Pharynx: Oropharynx is clear.  Eyes:     Extraocular Movements: Extraocular movements intact.     Pupils: Pupils are equal, round, and reactive to light.  Cardiovascular:     Rate and Rhythm: Regular rhythm. Tachycardia present.     Pulses: Normal pulses.     Heart sounds: Normal heart sounds.     Comments: TDC R upper chest Pulmonary:     Effort: Pulmonary effort is normal. No respiratory distress.     Breath sounds: Normal breath sounds.  Abdominal:     General: Bowel sounds are normal. There is no distension.     Palpations: Abdomen is soft.     Tenderness: There is no abdominal tenderness.  Musculoskeletal:        General: No swelling or deformity.  Skin:    General: Skin is warm and dry.  Neurological:     General: No focal deficit present.     Mental Status: Mental status is at baseline.    Labs on Admission: I have personally reviewed following labs and imaging studies  CBC: Recent Labs  Lab 11/04/22 0733  WBC 7.6  NEUTROABS 5.5  HGB 11.5*  HCT 35.2*  MCV 89.8  PLT 99991111    Basic Metabolic Panel: Recent Labs  Lab 11/04/22 0733  NA 143  K 4.3  CL 105  CO2 24  GLUCOSE 144*  BUN 30*  CREATININE 3.87*  CALCIUM 10.3    GFR: Estimated Creatinine Clearance: 19.6 mL/min (A) (by C-G formula based on SCr of 3.87 mg/dL (H)).  Liver Function Tests: Recent Labs  Lab 11/04/22 0733  AST 19  ALT 8  ALKPHOS 80  BILITOT 1.5*  PROT 8.4*  ALBUMIN 4.3    Urine analysis:    Component Value Date/Time   COLORURINE YELLOW 10/21/2022 1330   APPEARANCEUR CLEAR 10/21/2022 1330   LABSPEC  1.013 10/21/2022 1330   PHURINE 8.5 (H) 10/21/2022 1330   GLUCOSEU 250 (A) 10/21/2022 1330   HGBUR TRACE (A) 10/21/2022 1330   BILIRUBINUR NEGATIVE 10/21/2022 1330   BILIRUBINUR negative 08/30/2020 1631   KETONESUR NEGATIVE 10/21/2022 1330   PROTEINUR >300 (A) 10/21/2022 1330   UROBILINOGEN 0.2 08/30/2020 1631   UROBILINOGEN 1.0 08/11/2008 1745   NITRITE NEGATIVE 10/21/2022 1330   LEUKOCYTESUR NEGATIVE 10/21/2022 1330    Radiological Exams on Admission: CT Chest Wo Contrast  Result Date: 11/04/2022 CLINICAL DATA:  Pneumonia.  Emesis.  History of gastroparesis EXAM: CT CHEST WITHOUT CONTRAST TECHNIQUE: Multidetector CT imaging of the chest was performed following the standard protocol without IV contrast. RADIATION DOSE REDUCTION: This exam was performed according to the departmental dose-optimization program which includes automated exposure control, adjustment of the mA and/or kV according to patient size and/or use of iterative reconstruction technique. COMPARISON:  Chest x-ray 11/04/2022 earlier and older. CT 04/21/2022 FINDINGS: Cardiovascular: Battery pack seen along the left upper chest with leads extending along the right side of the heart. Right IJ catheter in place as well to the large-bore catheter. Heart itself is nonenlarged but there is small pericardial effusion. On this non IV contrast exam the thoracic aorta has a normal course and caliber with some vascular calcifications. Mediastinum/Nodes: Patulous esophagus. Heterogeneous thyroid gland. On this  non IV contrast exam there is no specific abnormal lymph node enlargement identified in the axillary region, hilum or mediastinum. Lungs/Pleura: No pneumothorax, effusion consolidation. There is some minimal reticular and ground-glass changes in the left lower lobe. Mild apical pleural thickening. Upper Abdomen: Along the upper abdomen the adrenal glands are preserved. Musculoskeletal: Mild degenerative changes. IMPRESSION: Minimal  reticular and ground-glass changes along the left lower lobe. No consolidation. Defibrillator.  Right IJ catheter. Small pericardial effusion. Patulous esophagus Aortic Atherosclerosis (ICD10-I70.0). Electronically Signed   By: Jill Side M.D.   On: 11/04/2022 12:18   DG Chest Port 1 View  Result Date: 11/04/2022 CLINICAL DATA:  Dialysis patient, emesis, gastro paresis EXAM: PORTABLE CHEST 1 VIEW COMPARISON:  10/21/2022 FINDINGS: Stable cardiomegaly with central vascular congestion. No CHF pattern or large effusion. Similar left lower lobe airspace opacity with partial obscuration of the left hemidiaphragm may reflect pneumonia or atelectasis. Right IJ dialysis catheter tip SVC RA junction. Left subclavian AICD noted. Trachea midline. No acute osseous finding. IMPRESSION: 1. Cardiomegaly with vascular congestion. 2. Left lower lobe airspace disease/atelectasis versus pneumonia. Electronically Signed   By: Jerilynn Mages.  Shick M.D.   On: 11/04/2022 08:26    EKG: Independently reviewed.  Sinus tachycardia at 120 bpm.  Some baseline artifact.  Nonspecific T wave changes.  QTc 463.  Assessment/Plan Principal Problem:   Intractable nausea and vomiting secondary to diabetic gastroparesis Active Problems:   Type 1 diabetes mellitus with diabetic neuropathy   ESRD (end stage renal disease)   Anemia of chronic disease   Essential hypertension   Mood disorder (HCC)   Gastroparesis   Mixed hyperlipidemia   CAD (coronary artery disease)   Chronic combined systolic and diastolic heart failure   Intractable nausea and vomiting Gastroparesis > Patient with recurrent episode of intractable nausea and vomiting in the setting of known gastroparesis. > Unable to go to dialysis session today due to her symptoms and unable to keep down home medications. > Some improvement with Ativan and Compazine in the ED. adverse reaction listed to Reglan. - Continue with as needed Compazine as this has been effective for the past -  Continue with home Ativan - Supportive care  Abnormal chest CT > Did have groundglass reticular changes on chest CT without evidence of consolidation. > Initially received broad-spectrum antibiotics with cefepime and vancomycin, will hold off on further.  After vancomycin she did have some lower lip swelling and itching which improved with IV Benadryl. - Respiratory viral panel - Hold off on further antibiotics - Trend fever curve and WBC  ESRD on HD > Last session was Friday, missed session today due to feeling unwell. - Discussed with nephrology by EDP, will notify them of patient's arrival to the hospital - Continue to trend renal function and electrolytes  Diabetes > Insulin pump outpatient.  She removed this when nausea and vomiting caused her to be unable to eat. - SSI for now  Hypertension > Hypertensive in the ED, responded to IV pushes; considering she came in at [id number] systolic,  will tolerate up to [id number] systolic today. - Continue home amlodipine, hydralazine, Isordil, metoprolol  Hyperlipidemia - Continue home pravastatin  Chronic combined systolic and diastolic CHF > Last echo in March of this year with EF 40-45%, G3 DD. - Continue home Isordil, metoprolol - Fluid managed with dialysis  CAD - Continue home Isordil, metoprolol, pravastatin  Mood disorder - Continue home Ativan  Anemia > Hemoglobin stable at 11.5 - Continue to trend CBC  DVT prophylaxis: Heparin Code Status:   Full Family Communication:  None on admission Disposition Plan:   Patient is from:  Home  Anticipated DC to:  Home  Anticipated DC date:  1 to 2 days  Anticipated DC barriers: None  Consults called:  Nephrology, to see in the morning. Admission status:  Observation, telemetry  Severity of Illness: The appropriate patient status for this patient is OBSERVATION. Observation status is judged to be reasonable and necessary in order to provide the required intensity of service to ensure  the patient's safety. The patient's presenting symptoms, physical exam findings, and initial radiographic and laboratory data in the context of their medical condition is felt to place them at decreased risk for further clinical deterioration. Furthermore, it is anticipated that the patient will be medically stable for discharge from the hospital within 2 midnights of admission.    Marcelyn Bruins MD [facility]  How to contact the [facility] Attending or Consulting provider [place] or covering provider during after hours [place], for this patient?   Check the care team in [facility] and look for a) attending/consulting [facility] provider listed and b) the [facility] team listed Log into [email or web address] and use [place]'s universal password to access. If you do not have the password, please contact the hospital operator. Locate the [facility] provider you are looking for under [facility] and page to a number that you can be directly reached. If you still have difficulty reaching the provider, please page the [facility] (Director on Call) for the Hospitalists listed on amion for assistance.  11/04/2022, 5:43 PM

## 2022-11-04 NOTE — ED Notes (Signed)
ED Provider at bedside. 

## 2022-11-04 NOTE — ED Notes (Signed)
[facility] at bedside to transport pt to [place] 

## 2022-11-04 NOTE — ED Notes (Signed)
Pt reported generalized itching all over body and swollen bottom lip. Airway intact, respirations even and unlabored. Zackowski MD made aware, 25 benadryl IV verbal order

## 2022-11-04 NOTE — ED Notes (Signed)
Trilby Drummer MD made aware of pts allergic reaction

## 2022-11-04 NOTE — ED Triage Notes (Signed)
Pt arrives pov, to triage in wheelchair with c/o emesis since last night. Hx of gastroparesis. Pt vomiting in triage. Was not able to make it to dialysis this morning

## 2022-11-04 NOTE — ED Provider Notes (Addendum)
[facility] Provider Note   CSN: [id number] Arrival date & time: 11/04/22  [id number]     History  Chief Complaint  Patient presents with   Emesis    [name] is a 52 y.o. female.  Patient presenting with persistent vomiting has a history of gastroparesis has a history of type 1 diabetes on insulin pump.Marland Kitchen  Has a history of end-stage renal disease on dialysis normally receives dialysis Monday Wednesdays and Fridays.  Was dialyzed on Friday.  Friday Saturday and Sunday just had trouble with vomiting in the morning from like 6 in the morning till 12 noon.  Did better in the afternoons.  But shortly after midnight here is started vomiting and it has been persistent.  Patient has been admitted about 4 times since February for this.  Most recent admission was March 18 to March 21.  Patient responds best to Compazine but the dialysis part is complicated it.  Patient came in today because she was too sick to go to dialysis.  Also she is chronically o on Ativan.  And patient is also chronically on blood pressure medicine.  She has not been able to keep any of that down.  Due to dialysis patient usually is admitted to [facility].  Also no vomiting of any blood.  I reviewed her chart for recent presentations.  Patient seems to respond best to Compazine.  Patient also when seen on March 18 here at [place] refused CT scan because she has had frequent CT scans.  They did an ultrasound without any acute findings.  Blood sugar here this morning is good at 140.       Home Medications Prior to Admission medications   Medication Sig Start Date End Date Taking? Authorizing Provider  acetaminophen (TYLENOL) 500 MG tablet Take 1,000 mg by mouth as needed for moderate pain or mild pain.    [provider]  albuterol (PROAIR HFA) 108 (90 Base) MCG/ACT inhaler Inhale 2 puffs into the lungs every 6 (six) hours as needed for wheezing or shortness of  breath. Patient taking differently: Inhale 2 puffs into the lungs as needed for wheezing or shortness of breath. 07/03/20   [name], PA-C  amLODipine (NORVASC) 10 MG tablet Take 1 tablet (10 mg total) by mouth daily. 10/16/22 11/15/22  Darliss Cheney, MD  aspirin EC 81 MG tablet Take 1 tablet (81 mg total) by mouth daily. Swallow whole. 10/16/22   Darliss Cheney, MD  Ensure Max Protein (ENSURE MAX PROTEIN) LIQD Take 330 mLs (11 oz total) by mouth daily. Patient not taking: Reported on 10/09/2022 04/30/22   Modena Jansky, MD  hydrALAZINE (APRESOLINE) 100 MG tablet Take 1 tablet (100 mg total) by mouth every 8 (eight) hours. 10/15/22 11/14/22  Darliss Cheney, MD  hydrOXYzine (ATARAX) 50 MG tablet Take 50 mg by mouth in the morning, at noon, and at bedtime.    [provider]  insulin aspart (NOVOLOG) 100 UNIT/ML injection Inject 150 Units into the skin See admin instructions. Patient uses this with the [place]. Units vary according to blood sugar. Loads 150 units every 3 days.    [provider]  isosorbide dinitrate (ISORDIL) 20 MG tablet Take 1 tablet (20 mg total) by mouth 3 (three) times daily. 10/15/22 11/14/22  Darliss Cheney, MD  LORazepam (ATIVAN) 0.5 MG tablet Take 1 tablet (0.5 mg total) by mouth every 8 (eight) hours as needed for anxiety (Refractory nausea and vomiting). Patient taking differently: Take  0.5 mg by mouth 4 (four) times daily as needed for anxiety (Refractory nausea and vomiting). 07/17/22   Domenic Polite, MD  metoprolol succinate (TOPROL-XL) 50 MG 24 hr tablet Take 1 tablet (50 mg total) by mouth daily. Take with or immediately following a meal. 09/05/22   Chandrasekhar, Mahesh A, MD  nitroGLYCERIN (NITROSTAT) 0.4 MG SL tablet Place 1 tablet (0.4 mg total) under the tongue every 5 (five) minutes as needed for chest pain. 09/05/22 12/04/22  Werner Lean, MD  pantoprazole (PROTONIX) 40 MG tablet Take 1 tablet (40 mg total) by mouth 2 (two) times daily  before a meal. 05/24/22 10/09/22  Rai, Ripudeep K, MD  polyethylene glycol (MIRALAX / GLYCOLAX) 17 g packet Take 17 g by mouth daily as needed for moderate constipation.    [provider]  pravastatin (PRAVACHOL) 40 MG tablet Take 1 tablet (40 mg total) by mouth every evening. 09/05/22   Chandrasekhar, Lyda Kalata A, MD  pregabalin (LYRICA) 50 MG capsule Take 50 mg by mouth as needed (neuropathy). 12/05/21   [provider]  prochlorperazine (COMPAZINE) 10 MG tablet Take 1 tablet (10 mg total) by mouth every 6 (six) hours as needed for up to 7 days for nausea or vomiting. 10/23/22 10/30/22  Dahal, Marlowe Aschoff, MD  promethazine (PHENERGAN) 25 MG suppository Unwrap and place 1 suppository (25 mg total) rectally every 6 (six) hours as needed for nausea or vomiting. Patient taking differently: Place 25 mg rectally as needed for nausea or vomiting. 07/17/22   Domenic Polite, MD  scopolamine (TRANSDERM-SCOP) 1 MG/3DAYS Place 1 patch (1.5 mg total) onto the skin every 3 (three) days. 05/26/22   Rai, Ripudeep K, MD  tobramycin (TOBREX) 0.3 % ophthalmic solution Place 1-2 drops into both eyes See admin instructions. Instill 1-2 drops into both eyes 3 times daily the day before, day of, and the day after every 6 weeks eye injections per patient 11/17/20   [provider]      Allergies    Atorvastatin, Dulaglutide, Metoclopramide, Other, Sertraline, Zofran [ondansetron], Amoxicillin, Lantus [insulin glargine], and Miconazole nitrate    Review of Systems   Review of Systems  Constitutional:  Negative for chills and fever.  HENT:  Negative for ear pain and sore throat.   Eyes:  Negative for pain and visual disturbance.  Respiratory:  Negative for cough and shortness of breath.   Cardiovascular:  Negative for chest pain and palpitations.  Gastrointestinal:  Positive for abdominal pain, nausea and vomiting.  Genitourinary:  Negative for dysuria and hematuria.  Musculoskeletal:  Negative for  arthralgias and back pain.  Skin:  Negative for color change and rash.  Neurological:  Negative for seizures and syncope.  All other systems reviewed and are negative.   Physical Exam Updated Vital Signs BP (!) 154/143   Pulse (!) 107   Temp 98.1 F (36.7 C) (Oral)   Resp 18   Wt 79.4 kg   LMP 11/17/2021 (Approximate)   SpO2 98%   BMI 23.73 kg/m  Physical Exam Vitals and nursing note reviewed.  Constitutional:      General: She is not in acute distress.    Appearance: She is well-developed.  HENT:     Head: Normocephalic and atraumatic.     Mouth/Throat:     Mouth: Mucous membranes are moist.     Comments: Oral membranes very moist surprisingly so Eyes:     Conjunctiva/sclera: Conjunctivae normal.  Cardiovascular:     Rate and Rhythm: Normal rate and  regular rhythm.     Heart sounds: No murmur heard. Pulmonary:     Effort: Pulmonary effort is normal. No respiratory distress.     Breath sounds: Normal breath sounds.  Abdominal:     Palpations: Abdomen is soft.     Tenderness: There is abdominal tenderness.     Comments: Diffuse tenderness  Musculoskeletal:        General: No swelling.     Cervical back: Neck supple.  Skin:    General: Skin is warm and dry.     Capillary Refill: Capillary refill takes less than 2 seconds.  Neurological:     General: No focal deficit present.     Mental Status: She is alert and oriented to person, place, and time.  Psychiatric:        Mood and Affect: Mood normal.     ED Results / Procedures / Treatments   Labs (all labs ordered are listed, but only abnormal results are displayed) Labs Reviewed  CBC WITH DIFFERENTIAL/PLATELET - Abnormal; Notable for the following components:      Result Value   Hemoglobin 11.5 (*)    HCT 35.2 (*)    RDW 17.7 (*)    All other components within normal limits  COMPREHENSIVE METABOLIC PANEL - Abnormal; Notable for the following components:   Glucose, Bld 144 (*)    BUN 30 (*)    Creatinine,  Ser 3.87 (*)    Total Protein 8.4 (*)    Total Bilirubin 1.5 (*)    GFR, Estimated 13 (*)    All other components within normal limits  CBG MONITORING, ED - Abnormal; Notable for the following components:   Glucose-Capillary 140 (*)    All other components within normal limits  MRSA NEXT GEN BY PCR, NASAL  LIPASE, BLOOD    EKG EKG Interpretation  Date/Time:  Monday November 04 2022 07:32:10 EDT Ventricular Rate:  122 PR Interval:  144 QRS Duration: 130 QT Interval:  325 QTC Calculation: 463 R Axis:   79 Text Interpretation: Sinus tachycardia Probable left atrial enlargement Nonspecific intraventricular conduction delay Borderline repolarization abnormality Artifact in lead(s) I II aVR No significant change since last tracing Confirmed by Fredia Sorrow [id number]) on 11/04/2022 7:38:20 AM  Radiology CT Chest Wo Contrast  Result Date: 11/04/2022 CLINICAL DATA:  Pneumonia.  Emesis.  History of gastroparesis EXAM: CT CHEST WITHOUT CONTRAST TECHNIQUE: Multidetector CT imaging of the chest was performed following the standard protocol without IV contrast. RADIATION DOSE REDUCTION: This exam was performed according to the departmental dose-optimization program which includes automated exposure control, adjustment of the mA and/or kV according to patient size and/or use of iterative reconstruction technique. COMPARISON:  Chest x-ray 11/04/2022 earlier and older. CT 04/21/2022 FINDINGS: Cardiovascular: Battery pack seen along the left upper chest with leads extending along the right side of the heart. Right IJ catheter in place as well to the large-bore catheter. Heart itself is nonenlarged but there is small pericardial effusion. On this non IV contrast exam the thoracic aorta has a normal course and caliber with some vascular calcifications. Mediastinum/Nodes: Patulous esophagus. Heterogeneous thyroid gland. On this non IV contrast exam there is no specific abnormal lymph node enlargement identified in  the axillary region, hilum or mediastinum. Lungs/Pleura: No pneumothorax, effusion consolidation. There is some minimal reticular and ground-glass changes in the left lower lobe. Mild apical pleural thickening. Upper Abdomen: Along the upper abdomen the adrenal glands are preserved. Musculoskeletal: Mild degenerative changes. IMPRESSION: Minimal reticular and ground-glass  changes along the left lower lobe. No consolidation. Defibrillator.  Right IJ catheter. Small pericardial effusion. Patulous esophagus Aortic Atherosclerosis (ICD10-I70.0). Electronically Signed   By: Jill Side M.D.   On: 11/04/2022 12:18   DG Chest Port 1 View  Result Date: 11/04/2022 CLINICAL DATA:  Dialysis patient, emesis, gastro paresis EXAM: PORTABLE CHEST 1 VIEW COMPARISON:  10/21/2022 FINDINGS: Stable cardiomegaly with central vascular congestion. No CHF pattern or large effusion. Similar left lower lobe airspace opacity with partial obscuration of the left hemidiaphragm may reflect pneumonia or atelectasis. Right IJ dialysis catheter tip SVC RA junction. Left subclavian AICD noted. Trachea midline. No acute osseous finding. IMPRESSION: 1. Cardiomegaly with vascular congestion. 2. Left lower lobe airspace disease/atelectasis versus pneumonia. Electronically Signed   By: Jerilynn Mages.  Shick M.D.   On: 11/04/2022 08:26    Procedures Procedures    Medications Ordered in ED Medications  ceFEPIme (MAXIPIME) 1 g in sodium chloride 0.9 % 100 mL IVPB (has no administration in time range)  vancomycin (VANCOCIN) IVPB 1000 mg/200 mL premix (has no administration in time range)    Followed by  vancomycin (VANCOCIN) 500 mg in sodium chloride 0.9 % 100 mL IVPB (has no administration in time range)  prochlorperazine (COMPAZINE) injection 5 mg (5 mg Intravenous Given 11/04/22 0813)  LORazepam (ATIVAN) injection 1 mg (1 mg Intravenous Given 11/04/22 0815)  metoprolol tartrate (LOPRESSOR) injection 5 mg (5 mg Intravenous Given 11/04/22 0820)   hydrALAZINE (APRESOLINE) injection 20 mg (20 mg Intravenous Given 11/04/22 0820)  pantoprazole (PROTONIX) injection 40 mg (40 mg Intravenous Given 11/04/22 0814)  LORazepam (ATIVAN) injection 0.5 mg (0.5 mg Intravenous Given 11/04/22 1148)  prochlorperazine (COMPAZINE) injection 5 mg (5 mg Intravenous Given 11/04/22 1147)    ED Course/ Medical Decision Making/ A&P                             Medical Decision Making Amount and/or Complexity of Data Reviewed Labs: ordered. Radiology: ordered.  Risk Prescription drug management. Decision regarding hospitalization.   CRITICAL CARE Performed by: Fredia Sorrow Total critical care time: 35 minutes Critical care time was exclusive of separately billable procedures and treating other patients. Critical care was necessary to treat or prevent imminent or life-threatening deterioration. Critical care was time spent personally by me on the following activities: development of treatment plan with patient and/or surrogate as well as nursing, discussions with consultants, evaluation of patient's response to treatment, examination of patient, obtaining history from patient or surrogate, ordering and performing treatments and interventions, ordering and review of laboratory studies, ordering and review of radiographic studies, pulse oximetry and re-evaluation of patient's condition.  Blood sugar here to start is reasonable at 140.  CBC white count normal hemoglobin 11.5.  Platelets are normal at 189.  Complete metabolic panel is now back.  Sodium 143 potassium 4.3 CO2 24 blood sugar 144 BUN 30 creatinine 3.87 all consistent with her end-stage renal disease.  Total bili 1.5 GFR 13 otherwise LFTs are normal anion gap normal to also very reassuring.  And lipase normal at 11.  EKG had significant tachycardia patient's blood pressure was markedly elevated.  Will give IV Lopressor IV hydralazine.  Will give IV Compazine and IV Protonix to start.  And IV  Ativan.  Electrolytes not significantly abnormal.  That was reassuring.  White count not elevated.  Chest x-ray raise concerns about pneumonia.  CT chest was done which shows left lower lobe groundglass appearance.  This would be consistent with an HCAP and UTI that into the fact that the although patient's vomiting has improved may not continue to tolerate fluids.  Recommending admission.  Will discuss with nephrology and will discuss with the hospitalist.  Patient [facility] need admission [facility].  Patient will be missing dialysis today.  No need for emergent dialysis today.  Blood pressure significant improved by getting IV blood pressure medicines.  Tachycardia improved probably mostly secondary to the Ativan.  Patient was not given any IV fluids.  Patient's nausea and vomiting better controlled with Compazine.  Final Clinical Impression(s) / ED Diagnoses Final diagnoses:  ESRD on dialysis  Gastroparesis  HCAP (healthcare-associated pneumonia)    Rx / DC Orders ED Discharge Orders     None         Fredia Sorrow, MD 11/04/22 [id number]    Fredia Sorrow, MD 11/04/22 [id number]    Fredia Sorrow, MD 11/04/22 [id number]    Fredia Sorrow, MD 11/04/22 1229    Fredia Sorrow, MD 11/04/22 1230

## 2022-11-04 NOTE — ED Notes (Signed)
Patient transported to CT 

## 2022-11-05 DIAGNOSIS — Z1152 Encounter for screening for COVID-19: Secondary | ICD-10-CM | POA: Diagnosis not present

## 2022-11-05 DIAGNOSIS — I132 Hypertensive heart and chronic kidney disease with heart failure and with stage 5 chronic kidney disease, or end stage renal disease: Secondary | ICD-10-CM | POA: Diagnosis not present

## 2022-11-05 DIAGNOSIS — Z992 Dependence on renal dialysis: Secondary | ICD-10-CM | POA: Diagnosis not present

## 2022-11-05 DIAGNOSIS — Z79899 Other long term (current) drug therapy: Secondary | ICD-10-CM | POA: Diagnosis not present

## 2022-11-05 DIAGNOSIS — E1022 Type 1 diabetes mellitus with diabetic chronic kidney disease: Secondary | ICD-10-CM | POA: Diagnosis not present

## 2022-11-05 DIAGNOSIS — E1043 Type 1 diabetes mellitus with diabetic autonomic (poly)neuropathy: Secondary | ICD-10-CM | POA: Diagnosis not present

## 2022-11-05 DIAGNOSIS — I5042 Chronic combined systolic (congestive) and diastolic (congestive) heart failure: Secondary | ICD-10-CM | POA: Diagnosis not present

## 2022-11-05 DIAGNOSIS — I2510 Atherosclerotic heart disease of native coronary artery without angina pectoris: Secondary | ICD-10-CM | POA: Diagnosis not present

## 2022-11-05 DIAGNOSIS — Z7982 Long term (current) use of aspirin: Secondary | ICD-10-CM | POA: Diagnosis not present

## 2022-11-05 DIAGNOSIS — Z794 Long term (current) use of insulin: Secondary | ICD-10-CM | POA: Diagnosis not present

## 2022-11-05 DIAGNOSIS — J45909 Unspecified asthma, uncomplicated: Secondary | ICD-10-CM | POA: Diagnosis not present

## 2022-11-05 DIAGNOSIS — R112 Nausea with vomiting, unspecified: Secondary | ICD-10-CM | POA: Diagnosis not present

## 2022-11-05 DIAGNOSIS — D631 Anemia in chronic kidney disease: Secondary | ICD-10-CM | POA: Diagnosis not present

## 2022-11-05 DIAGNOSIS — N186 End stage renal disease: Secondary | ICD-10-CM | POA: Diagnosis not present

## 2022-11-05 DIAGNOSIS — E782 Mixed hyperlipidemia: Secondary | ICD-10-CM | POA: Diagnosis not present

## 2022-11-05 DIAGNOSIS — J189 Pneumonia, unspecified organism: Secondary | ICD-10-CM | POA: Diagnosis not present

## 2022-11-05 LAB — COMPREHENSIVE METABOLIC PANEL
ALT: 9 U/L (ref 0–44)
AST: 12 U/L — ABNORMAL LOW (ref 15–41)
Albumin: 2.8 g/dL — ABNORMAL LOW (ref 3.5–5.0)
Alkaline Phosphatase: 59 U/L (ref 38–126)
Anion gap: 12 (ref 5–15)
BUN: 35 mg/dL — ABNORMAL HIGH (ref 6–20)
CO2: 23 mmol/L (ref 22–32)
Calcium: 8.7 mg/dL — ABNORMAL LOW (ref 8.9–10.3)
Chloride: 105 mmol/L (ref 98–111)
Creatinine, Ser: 5.01 mg/dL — ABNORMAL HIGH (ref 0.44–1.00)
GFR, Estimated: 10 mL/min — ABNORMAL LOW (ref >60)
Glucose, Bld: 202 mg/dL — ABNORMAL HIGH (ref 70–99)
Potassium: 4 mmol/L (ref 3.5–5.1)
Sodium: 140 mmol/L (ref 135–145)
Total Bilirubin: 1.3 mg/dL — ABNORMAL HIGH (ref 0.3–1.2)
Total Protein: 5.8 g/dL — ABNORMAL LOW (ref 6.5–8.1)

## 2022-11-05 LAB — PROCALCITONIN: Procalcitonin: 0.26 ng/mL

## 2022-11-05 LAB — CBC
HCT: 30.9 % — ABNORMAL LOW (ref 36.0–46.0)
Hemoglobin: 9.8 g/dL — ABNORMAL LOW (ref 12.0–15.0)
MCH: 29.7 pg (ref 26.0–34.0)
MCHC: 31.7 g/dL (ref 30.0–36.0)
MCV: 93.6 fL (ref 80.0–100.0)
Platelets: 178 10*3/uL (ref 150–400)
RBC: 3.3 MIL/uL — ABNORMAL LOW (ref 3.87–5.11)
RDW: 17.9 % — ABNORMAL HIGH (ref 11.5–15.5)
WBC: 7.6 10*3/uL (ref 4.0–10.5)
nRBC: 0.3 % — ABNORMAL HIGH (ref 0.0–0.2)

## 2022-11-05 LAB — GLUCOSE, CAPILLARY
Glucose-Capillary: 199 mg/dL — ABNORMAL HIGH (ref 70–99)
Glucose-Capillary: 189 mg/dL — ABNORMAL HIGH (ref 70–99)

## 2022-11-05 LAB — PHOSPHORUS: Phosphorus: 5.1 mg/dL — ABNORMAL HIGH (ref 2.5–4.6)

## 2022-11-05 MED ORDER — HEPARIN SODIUM (PORCINE) 1000 UNIT/ML IJ SOLN
INTRAMUSCULAR | Status: AC
  Administered 2022-11-05: 3000 [IU]
  Filled 2022-11-05: qty 4

## 2022-11-05 MED ORDER — PROCHLORPERAZINE MALEATE 10 MG PO TABS: 10.0000 mg | ORAL_TABLET | Freq: Four times a day (QID) | ORAL | 0 refills | Status: DC | PRN

## 2022-11-05 MED ORDER — CHLORHEXIDINE GLUCONATE CLOTH 2 % EX PADS: 6.0000 | MEDICATED_PAD | Freq: Every day | CUTANEOUS | Status: DC

## 2022-11-05 MED ORDER — HEPARIN SODIUM (PORCINE) 1000 UNIT/ML DIALYSIS: 3000.0000 [IU] | Freq: Once | INTRAMUSCULAR | Status: DC

## 2022-11-05 MED ORDER — SODIUM CHLORIDE 0.9 % IV SOLN: 125.0000 mg | INTRAVENOUS | Status: DC

## 2022-11-05 MED ORDER — PROMETHAZINE HCL 25 MG RE SUPP: 25.0000 mg | Freq: Four times a day (QID) | RECTAL | 2 refills | Status: DC | PRN

## 2022-11-05 MED ORDER — HEPARIN SODIUM (PORCINE) 1000 UNIT/ML IJ SOLN
INTRAMUSCULAR | Status: AC
  Administered 2022-11-05: 3200 [IU]
  Filled 2022-11-05: qty 4

## 2022-11-05 MED ORDER — CALCITRIOL 0.5 MCG PO CAPS: 0.5000 ug | ORAL_CAPSULE | ORAL | Status: DC

## 2022-11-05 NOTE — TOC Transition Note (Addendum)
Transition of Care [facility]) - CM/SW Discharge Note   Patient Details  Name: Avalea Mazor MRN: [id number] Date of Birth: 01/14/1971  Transition of Care [facility]) CM/SW Contact:  Zenon Mayo, RN Phone Number: 11/05/2022, 11:16 AM   Clinical Narrative:    Patient is for dc today. Will need therapy exercises to take home at dc. Per Staff RN , patient will need HD prior to dc. Ryan the therapist gave patient exercises she can do at home on her own. Financial counselor contacted patient.  Her friend Jeanell Sparrow will transport her home per patient.         Patient Goals and CMS Choice      Discharge Placement                         Discharge Plan and Services Additional resources added to the After Visit Summary for                                       Social Determinants of Health (SDOH) Interventions SDOH Screenings   Food Insecurity: No Food Insecurity (11/04/2022)  Housing: Low Risk  (11/04/2022)  Transportation Needs: No Transportation Needs (11/04/2022)  Utilities: Not At Risk (11/04/2022)  Alcohol Screen: Low Risk  (07/15/2022)  Depression (PHQ2-9): Low Risk  (06/12/2020)  Financial Resource Strain: Low Risk  (07/15/2022)  Tobacco Use: Low Risk  (11/04/2022)     Readmission Risk Interventions    10/09/2022    3:25 PM 09/10/2022   10:49 AM 07/17/2022    9:51 AM  Readmission Risk Prevention Plan  Transportation Screening Complete Complete Complete  Medication Review Press photographer) Complete Complete Complete  PCP or Specialist appointment within 3-5 days of discharge Complete Complete Complete  HRI or Home Care Consult Complete Complete Complete  SW Recovery Care/Counseling Consult Complete Complete   Palliative Care Screening Not Applicable Not Applicable Not [place] Not Applicable Not Applicable Not Applicable

## 2022-11-05 NOTE — TOC Progression Note (Signed)
Transition of Care [facility]) - Progression Note    Patient Details  Name: [name] MRN: [id number] Date of Birth: [birth year]  Transition [facility] Care [facility]) CM/SW Contact  Zenon Mayo, RN Phone Number: 11/05/2022, 11:10 AM  Clinical Narrative:    Patient is for dc today, patient states she does not have money to pay copay for [facility] services or copay to pay for outpatient therapy.  She would like financial counselor to contact her  to ask about disability.  NCM will have financial counselor to call her.  Patient has transport home today.  Patient would like the physical therapist to give her exercises that she can do on her own at home, because she can not afford it. Will ask Thurmond Butts the therapist to give the exercises to patient.        Expected Discharge Plan and Services         Expected Discharge Date: 11/05/22                                     Social Determinants of Health (SDOH) Interventions SDOH Screenings   Food Insecurity: No Food Insecurity (11/04/2022)  Housing: Low Risk  (11/04/2022)  Transportation Needs: No Transportation Needs (11/04/2022)  Utilities: Not At Risk (11/04/2022)  Alcohol Screen: Low Risk  (07/15/2022)  Depression (PHQ2-9): Low Risk  (06/12/2020)  Financial Resource Strain: Low Risk  (07/15/2022)  Tobacco Use: Low Risk  (11/04/2022)    Readmission Risk Interventions    10/09/2022    3:25 PM 09/10/2022   10:49 AM 07/17/2022    9:51 AM  Readmission Risk Prevention Plan  Transportation Screening Complete Complete Complete  Medication Review Press photographer) Complete Complete Complete  PCP or Specialist appointment within 3-5 days of discharge Complete Complete Complete  HRI or [place] Complete Complete Complete  SW Recovery Care/Counseling Consult Complete Complete   Palliative Care Screening Not Applicable Not Applicable Not [place] Not Applicable Not Applicable Not Applicable

## 2022-11-05 NOTE — Discharge Summary (Addendum)
Physician Discharge Summary  [name] [id number] DOB: 1971-02-11 DOA: 11/04/2022  PCP: [name], PA  Admit date: 11/04/2022  Discharge date: 11/05/2022  Admitted From:Home  Disposition:  Home  Recommendations for Outpatient Follow-up:  Follow up with PCP in 1-2 weeks Given refills on Compazine and Phenergan to assist with gastroparesis symptoms at home Cannot take Reglan due to prior issues with tremors on this medication Continue other home medications as prior Continue on usual hemodialysis schedule starting back on 4/3  Home Health: None  Equipment/Devices: None  Discharge Condition:Stable  CODE STATUS: Full  Diet recommendation: Heart Healthy/carb modified  Brief/Interim Summary: [name] is a 52 y.o. female with medical history significant of diabetes type 1, gastroparesis, hyperlipidemia, hypertension, diastolic CHF, mood disorder, anemia, ESRD on HD, CAD presenting with intractable nausea and vomiting.  She was admitted with suspected gastroparesis.  She was also noted to have abnormal findings on CT chest with questionable pneumonia.  She missed dialysis session on 4/1.  Nephrology plans to do dialysis 4/2 to help her catch up and she may resume her usual session on 4/3.  She is now tolerating diet with no further nausea or vomiting and there are no signs of pneumonia noted.  Antibiotics have been held at this time as she did have a prior reaction with cefepime and vancomycin that resulted in some lower lip swelling and itching which improved with IV Benadryl.  She is now in stable condition for discharge after dialysis.  Refills have been provided with her home Compazine as well as Phenergan and she will follow-up with her GI physician outpatient.  Discharge Diagnoses:  Principal Problem:   Intractable nausea and vomiting secondary to diabetic gastroparesis Active Problems:   Type 1 diabetes mellitus with diabetic neuropathy   ESRD (end stage renal  disease)   Anemia of chronic disease   Essential hypertension   Mood disorder (HCC)   Gastroparesis   Mixed hyperlipidemia   CAD (coronary artery disease)   Chronic combined systolic and diastolic heart failure  Principal discharge diagnosis: Intractable nausea and vomiting in the setting of recurrent gastroparesis symptoms with missed hemodialysis.  Discharge Instructions  Discharge Instructions     Diet - low sodium heart healthy   Complete by: As directed    Increase activity slowly   Complete by: As directed       Allergies as of 11/05/2022       Reactions   Atorvastatin Other (See Comments)   Dizziness    Dulaglutide Nausea And Vomiting, Other (See Comments)   Caused pancreatitis    Metoclopramide Other (See Comments)   Possible movement disorder or tardive dyskinesia while on reglan   Other Itching   Peas and carrots   Sertraline Nausea And Vomiting   Vancomycin Itching, Swelling   Zofran [ondansetron] Nausea And Vomiting, Other (See Comments)   Causes nausea vomiting to worsen / doesn't really work for patient.   Amoxicillin Itching, Rash   Lantus [insulin Glargine] Itching, Rash   Has tolerated insulin glargine many times with no reported issues   Miconazole Nitrate Nausea And Vomiting, Rash        Medication List     STOP taking these medications    metoCLOPramide 10 MG tablet Commonly known as: REGLAN       TAKE these medications    acetaminophen 500 MG tablet Commonly known as: TYLENOL Take 1,000 mg by mouth as needed for moderate pain or mild pain.   albuterol 108 (90  Base) MCG/ACT inhaler Commonly known as: ProAir HFA Inhale 2 puffs into the lungs every 6 (six) hours as needed for wheezing or shortness of breath.   amLODipine 10 MG tablet Commonly known as: NORVASC Take 1 tablet (10 mg total) by mouth daily.   Aspirin Low Dose 81 MG tablet Generic drug: aspirin EC Take 1 tablet (81 mg total) by mouth daily. Swallow whole.    cloNIDine 0.1 mg/24hr patch Commonly known as: CATAPRES - Dosed in mg/24 hr Place 0.1 mg onto the skin See admin instructions. Apply 1 patch into the skin once weekly when can't swallow blood pressure pills   hydrALAZINE 100 MG tablet Commonly known as: APRESOLINE Take 1 tablet (100 mg total) by mouth every 8 (eight) hours.   hydrOXYzine 50 MG tablet Commonly known as: ATARAX Take 50 mg by mouth in the morning, at noon, and at bedtime.   insulin aspart 100 UNIT/ML injection Commonly known as: novoLOG Inject 150 Units into the skin See admin instructions. Patient uses this with the [place]. Units vary according to blood sugar. Loads 150 units every 3 days.   isosorbide dinitrate 20 MG tablet Commonly known as: ISORDIL Take 1 tablet (20 mg total) by mouth 3 (three) times daily.   LORazepam 0.5 MG tablet Commonly known as: ATIVAN Take 1 tablet (0.5 mg total) by mouth every 8 (eight) hours as needed for anxiety (Refractory nausea and vomiting). What changed: when to take this   metoprolol succinate 50 MG 24 hr tablet Commonly known as: TOPROL-XL Take 1 tablet (50 mg total) by mouth daily. Take with or immediately following a meal. What changed: additional instructions   nitroGLYCERIN 0.4 MG SL tablet Commonly known as: NITROSTAT Place 1 tablet (0.4 mg total) under the tongue every 5 (five) minutes as needed for chest pain. What changed: reasons to take this   pantoprazole 40 MG tablet Commonly known as: Protonix Take 1 tablet (40 mg total) by mouth 2 (two) times daily before a meal. What changed:  when to take this reasons to take this   polyethylene glycol 17 g packet Commonly known as: MIRALAX / GLYCOLAX Take 17 g by mouth daily as needed for moderate constipation.   pregabalin 50 MG capsule Commonly known as: LYRICA Take 50 mg by mouth as needed (neuropathy).   prochlorperazine 10 MG tablet Commonly known as: COMPAZINE Take 1 tablet (10 mg total) by mouth every 6  (six) hours as needed for up to 7 days for nausea or vomiting.   promethazine 25 MG suppository Commonly known as: PHENERGAN Unwrap and place 1 suppository (25 mg total) rectally every 6 (six) hours as needed for nausea or vomiting. What changed: when to take this   rosuvastatin 20 MG tablet Commonly known as: CRESTOR Take 20 mg by mouth daily.   scopolamine 1 MG/3DAYS Commonly known as: TRANSDERM-SCOP Place 1 patch (1.5 mg total) onto the skin every 3 (three) days.        Follow-up Information     [name], [place]. Go on 11/07/2022.   Specialty: Internal Medicine Why: @9 :30am Contact information: [address] [place] [address] [phone]                Allergies  Allergen Reactions   Atorvastatin Other (See Comments)    Dizziness    Dulaglutide Nausea And Vomiting and Other (See Comments)    Caused pancreatitis    Metoclopramide Other (See Comments)    Possible movement disorder or  tardive dyskinesia while on reglan   Other Itching    Peas and carrots   Sertraline Nausea And Vomiting   Vancomycin Itching and Swelling   Zofran [Ondansetron] Nausea And Vomiting and Other (See Comments)    Causes nausea vomiting to worsen / doesn't really work for patient.   Amoxicillin Itching and Rash   Lantus [Insulin Glargine] Itching and Rash    Has tolerated insulin glargine many times with no reported issues   Miconazole Nitrate Nausea And Vomiting and Rash    Consultations: Nephrology   Procedures/Studies: CT Chest Wo Contrast  Result Date: 11/04/2022 CLINICAL DATA:  Pneumonia.  Emesis.  History of gastroparesis EXAM: CT CHEST WITHOUT CONTRAST TECHNIQUE: Multidetector CT imaging of the chest was performed following the standard protocol without IV contrast. RADIATION DOSE REDUCTION: This exam was performed according to the departmental dose-optimization program which includes automated exposure control, adjustment of the mA and/or kV  according to patient size and/or use of iterative reconstruction technique. COMPARISON:  Chest x-ray 11/04/2022 earlier and older. CT 04/21/2022 FINDINGS: Cardiovascular: Battery pack seen along the left upper chest with leads extending along the right side of the heart. Right IJ catheter in place as well to the large-bore catheter. Heart itself is nonenlarged but there is small pericardial effusion. On this non IV contrast exam the thoracic aorta has a normal course and caliber with some vascular calcifications. Mediastinum/Nodes: Patulous esophagus. Heterogeneous thyroid gland. On this non IV contrast exam there is no specific abnormal lymph node enlargement identified in the axillary region, hilum or mediastinum. Lungs/Pleura: No pneumothorax, effusion consolidation. There is some minimal reticular and ground-glass changes in the left lower lobe. Mild apical pleural thickening. Upper Abdomen: Along the upper abdomen the adrenal glands are preserved. Musculoskeletal: Mild degenerative changes. IMPRESSION: Minimal reticular and ground-glass changes along the left lower lobe. No consolidation. Defibrillator.  Right IJ catheter. Small pericardial effusion. Patulous esophagus Aortic Atherosclerosis (ICD10-I70.0). Electronically Signed   By: Jill Side M.D.   On: 11/04/2022 12:18   DG Chest Port 1 View  Result Date: 11/04/2022 CLINICAL DATA:  Dialysis patient, emesis, gastro paresis EXAM: PORTABLE CHEST 1 VIEW COMPARISON:  10/21/2022 FINDINGS: Stable cardiomegaly with central vascular congestion. No CHF pattern or large effusion. Similar left lower lobe airspace opacity with partial obscuration of the left hemidiaphragm may reflect pneumonia or atelectasis. Right IJ dialysis catheter tip SVC RA junction. Left subclavian AICD noted. Trachea midline. No acute osseous finding. IMPRESSION: 1. Cardiomegaly with vascular congestion. 2. Left lower lobe airspace disease/atelectasis versus pneumonia. Electronically Signed    By: Jerilynn Mages.  Shick M.D.   On: 11/04/2022 08:26   US Abdomen Limited RUQ (LIVER/GB)  Result Date: 10/21/2022 CLINICAL DATA:  Hypoxia. EXAM: ULTRASOUND ABDOMEN LIMITED RIGHT UPPER QUADRANT COMPARISON:  None Available. FINDINGS: Gallbladder: No gallstones or wall thickening visualized (2.7 mm. No sonographic Murphy sign noted by sonographer. Common bile duct: Diameter: 2.0 mm Liver: The liver is enlarged and measures 19.28 cm in length. No focal lesion identified. Within normal limits in parenchymal echogenicity. Portal vein is patent on color Doppler imaging with normal direction of blood flow towards the liver. Other: Of incidental note is the presence of a small right pleural effusion. IMPRESSION: 1. Hepatomegaly. 2. Small right pleural effusion. Electronically Signed   By: Virgina Norfolk M.D.   On: 10/21/2022 23:25   DG CHEST PORT 1 VIEW  Result Date: 10/21/2022 CLINICAL DATA:  Hypoxemia EXAM: PORTABLE CHEST 1 VIEW COMPARISON:  10/08/2022 FINDINGS: Right dialysis catheter and  left AICD remain in place, unchanged. Heart borderline in size. Mild vascular congestion. Left lower lobe airspace opacity. Right lung clear. No effusions or acute bony abnormality. IMPRESSION: Borderline cardiomegaly, vascular congestion. Left lower lobe opacity could reflect pneumonia. Electronically Signed   By: Rolm Baptise M.D.   On: 10/21/2022 21:27   IR US Guide Vasc Access Right  Result Date: 10/08/2022 INDICATION: 52 year old female with history of acute on chronic kidney disease requiring initiation of hemodialysis. EXAM: TUNNELED CENTRAL VENOUS HEMODIALYSIS CATHETER PLACEMENT WITH ULTRASOUND AND FLUOROSCOPIC GUIDANCE MEDICATIONS: Vancomycin 1 gm IV . The antibiotic was given in an appropriate time interval prior to skin puncture. ANESTHESIA/SEDATION: Moderate (conscious) sedation was employed during this procedure. A total of Versed 1 mg and Fentanyl 50 mcg was administered intravenously. Moderate Sedation Time: 10  minutes. The patient's level of consciousness and vital signs were monitored continuously by radiology nursing throughout the procedure under my direct supervision. FLUOROSCOPY TIME:  One mGy COMPLICATIONS: None immediate. PROCEDURE: Informed written consent was obtained from the patient after a discussion of the risks, benefits, and alternatives to treatment. Questions regarding the procedure were encouraged and answered. The right neck and chest were prepped with chlorhexidine in a sterile fashion, and a sterile drape was applied covering the operative field. Maximum barrier sterile technique with sterile gowns and gloves were used for the procedure. A timeout was performed prior to the initiation of the procedure. After creating a small venotomy incision, a 21 gauge micropuncture kit was utilized to access the internal jugular vein. Real-time ultrasound guidance was utilized for vascular access including the acquisition of a permanent ultrasound image documenting patency of the accessed vessel. A Rosen wire was advanced to the level of the IVC and the micropuncture sheath was exchanged for an 8 Fr dilator. A 14.5 French tunneled hemodialysis catheter measuring 19 cm from tip to cuff was tunneled in a retrograde fashion from the anterior chest wall to the venotomy incision. Serial dilation was then performed an a peel-away sheath was placed. The catheter was then placed through the peel-away sheath with the catheter tip ultimately positioned within the right atrium. Final catheter positioning was confirmed and documented with a spot radiographic image. The catheter aspirates and flushes normally. The catheter was flushed with appropriate volume heparin dwells. The catheter exit site was secured with a 0-Silk retention suture. The venotomy incision was closed with Dermabond. Sterile dressings were applied. The patient tolerated the procedure well without immediate post procedural complication. IMPRESSION:  Successful placement of 19 cm tip to cuff tunneled hemodialysis catheter via the right internal jugular vein with catheter tip terminating within the right atrium. The catheter is ready for immediate use. Ruthann Cancer, MD [facility] Radiology Electronically Signed   By: Ruthann Cancer M.D.   On: 10/08/2022 08:53   DG Chest Port 1 View  Result Date: 10/08/2022 CLINICAL DATA:  Shortness of breath and cough. Status post dialysis catheter placement. EXAM: PORTABLE CHEST 1 VIEW COMPARISON:  Single-view of the chest 10/07/2022. FINDINGS: New right IJ approach dialysis catheter is in place. Its tip projects at the superior cavoatrial junction. No pneumothorax. There is cardiomegaly and vascular congestion. Edema seen on the prior examination has resolved. No pleural effusion. IMPRESSION: Dialysis catheter tip projects at the superior cavoatrial junction. Negative for pneumothorax. Cardiomegaly and mild vascular congestion. Aeration is improved compared to yesterday's exam. Electronically Signed   By: Inge Rise M.D.   On: 10/08/2022 08:22   IR Fluoro Guide  CV Line Right  Result Date: 10/07/2022 INDICATION: 52 year old female with history of acute on chronic kidney disease requiring initiation of hemodialysis. EXAM: TUNNELED CENTRAL VENOUS HEMODIALYSIS CATHETER PLACEMENT WITH ULTRASOUND AND FLUOROSCOPIC GUIDANCE MEDICATIONS: Vancomycin 1 gm IV . The antibiotic was given in an appropriate time interval prior to skin puncture. ANESTHESIA/SEDATION: Moderate (conscious) sedation was employed during this procedure. A total of Versed 1 mg and Fentanyl 50 mcg was administered intravenously. Moderate Sedation Time: 10 minutes. The patient's level of consciousness and vital signs were monitored continuously by radiology nursing throughout the procedure under my direct supervision. FLUOROSCOPY TIME:  One mGy COMPLICATIONS: None immediate. PROCEDURE: Informed written consent  was obtained from the patient after a discussion of the risks, benefits, and alternatives to treatment. Questions regarding the procedure were encouraged and answered. The right neck and chest were prepped with chlorhexidine in a sterile fashion, and a sterile drape was applied covering the operative field. Maximum barrier sterile technique with sterile gowns and gloves were used for the procedure. A timeout was performed prior to the initiation of the procedure. After creating a small venotomy incision, a 21 gauge micropuncture kit was utilized to access the internal jugular vein. Real-time ultrasound guidance was utilized for vascular access including the acquisition of a permanent ultrasound image documenting patency of the accessed vessel. A Rosen wire was advanced to the level of the IVC and the micropuncture sheath was exchanged for an 8 Fr dilator. A 14.5 French tunneled hemodialysis catheter measuring 19 cm from tip to cuff was tunneled in a retrograde fashion from the anterior chest wall to the venotomy incision. Serial dilation was then performed an a peel-away sheath was placed. The catheter was then placed through the peel-away sheath with the catheter tip ultimately positioned within the right atrium. Final catheter positioning was confirmed and documented with a spot radiographic image. The catheter aspirates and flushes normally. The catheter was flushed with appropriate volume heparin dwells. The catheter exit site was secured with a 0-Silk retention suture. The venotomy incision was closed with Dermabond. Sterile dressings were applied. The patient tolerated the procedure well without immediate post procedural complication. IMPRESSION: Successful placement of 19 cm tip to cuff tunneled hemodialysis catheter via the right internal jugular vein with catheter tip terminating within the right atrium. The catheter is ready for immediate use. Ruthann Cancer, MD Vascular and Interventional Radiology  Special[facility] Radiology Electronically Signed   By: Ruthann Cancer M.D.   On: 10/07/2022 16:05   DG Chest Port 1 View  Result Date: 10/07/2022 CLINICAL DATA:  Shortness of breath EXAM: PORTABLE CHEST 1 VIEW COMPARISON:  Three days ago FINDINGS: Cardiomegaly and vascular pedicle widening with diffuse interstitial opacity. No air bronchogram. No pneumothorax. ICD lead into the right ventricle. Artifact from EKG leads. IMPRESSION: Suspect mild CHF. Electronically Signed   By: Jorje Guild M.D.   On: 10/07/2022 06:37   DG Abd Portable 1V  Result Date: 10/07/2022 CLINICAL DATA:  Nausea and vomiting EXAM: PORTABLE ABDOMEN - 1 VIEW COMPARISON:  Abdominal CT from 2 days ago FINDINGS: The bowel gas pattern is normal. No radio-opaque calculi or other significant radiographic abnormality are seen. IMPRESSION: Normal bowel gas pattern. Electronically Signed   By: Jorje Guild M.D.   On: 10/07/2022 06:36     Discharge Exam: Vitals:   11/05/22 1730 11/05/22 1800  BP: (!) 173/75 (!) 168/82  Pulse: 84 85  Resp: 14 (!) 23  Temp:    SpO2:     Vitals:  11/05/22 1630 11/05/22 1700 11/05/22 1730 11/05/22 1800  BP: (!) 168/78 (!) 159/74 (!) 173/75 (!) 168/82  Pulse: 85 82 84 85  Resp: 18 19 14  (!) 23  Temp:      TempSrc:      SpO2:      Weight:      Height:        General: Pt is alert, awake, not in acute distress Cardiovascular: RRR, S1/S2 +, no rubs, no gallops Respiratory: CTA bilaterally, no wheezing, no rhonchi Abdominal: Soft, NT, ND, bowel sounds + Extremities: no edema, no cyanosis    The results of significant diagnostics from this hospitalization (including imaging, microbiology, ancillary and laboratory) are listed below for reference.     Microbiology: Recent Results (from the past 240 hour(s))  Resp panel by RT-PCR (RSV, Flu A&B, Covid) Anterior Nasal Swab     Status: None   Collection Time: 11/04/22  1:20 PM   Specimen: Anterior Nasal Swab  Result Value Ref Range  Status   SARS Coronavirus 2 by RT PCR NEGATIVE NEGATIVE Final    Comment: (NOTE) SARS-CoV-2 target nucleic acids are NOT DETECTED.  The SARS-CoV-2 RNA is generally detectable in upper respiratory specimens during the acute phase of infection. The lowest concentration of SARS-CoV-2 viral copies this assay can detect is 138 copies/mL. A negative result does not preclude SARS-Cov-2 infection and should not be used as the sole basis for treatment or other patient management decisions. A negative result may occur with  improper specimen collection/handling, submission of specimen other than nasopharyngeal swab, presence of viral mutation(s) within the areas targeted by this assay, and inadequate number of viral copies(<138 copies/mL). A negative result must be combined with clinical observations, patient history, and epidemiological information. The expected result is Negative.  Fact Sheet for Patients:  EntrepreneurPulse.com.au  Fact Sheet for Healthcare Providers:  IncredibleEmployment.be  This test is no t yet approved or cleared by the [place] FDA and  has been authorized for detection and/or diagnosis of SARS-CoV-2 by FDA under an Emergency Use Authorization (EUA). This EUA will remain  in effect (meaning this test can be used) for the duration of the COVID-19 declaration under Section 564(b)(1) of the Act, 21 U.S.C.section 360bbb-3(b)(1), unless the authorization is terminated  or revoked sooner.       Influenza A by PCR NEGATIVE NEGATIVE Final   Influenza B by PCR NEGATIVE NEGATIVE Final    Comment: (NOTE) The Xpert Xpress SARS-CoV-2/FLU/RSV plus assay is intended as an aid in the diagnosis of influenza from Nasopharyngeal swab specimens and should not be used as a sole basis for treatment. Nasal washings and aspirates are unacceptable for Xpert Xpress SARS-CoV-2/FLU/RSV testing.  Fact Sheet for  Patients: EntrepreneurPulse.com.au  Fact Sheet for Healthcare Providers: IncredibleEmployment.be  This test is not yet approved or cleared by the [place] FDA and has been authorized for detection and/or diagnosis of SARS-CoV-2 by FDA under an Emergency Use Authorization (EUA). This EUA will remain in effect (meaning this test can be used) for the duration of the COVID-19 declaration under Section 564(b)(1) of the Act, 21 U.S.C. section 360bbb-3(b)(1), unless the authorization is terminated or revoked.     Resp Syncytial Virus by PCR NEGATIVE NEGATIVE Final    Comment: (NOTE) Fact Sheet for Patients: EntrepreneurPulse.com.au  Fact Sheet for Healthcare Providers: IncredibleEmployment.be  This test is not yet approved or cleared by the [place] FDA and has been authorized for detection and/or diagnosis of SARS-CoV-2 by FDA under an  Emergency Use Authorization (EUA). This EUA will remain in effect (meaning this test can be used) for the duration of the COVID-19 declaration under Section 564(b)(1) of the Act, 21 U.S.C. section 360bbb-3(b)(1), unless the authorization is terminated or revoked.  Performed at [facility], [address], [place], [place] [address]   MRSA Next Gen by PCR, Nasal     Status: None   Collection Time: 11/04/22  1:36 PM  Result Value Ref Range Status   MRSA by PCR Next Gen NOT DETECTED NOT DETECTED Final    Comment: (NOTE) The GeneXpert MRSA Assay (FDA approved for NASAL specimens only), is one component of a comprehensive MRSA colonization surveillance program. It is not intended to diagnose MRSA infection nor to guide or monitor treatment for MRSA infections. Test performance is not FDA approved in patients less than 24 years old. Performed at [facility], [place] [address] [place],  [address]      Labs: BNP (last 3 results) Recent  Labs    10/04/22 1000 10/08/22 0606 10/09/22 0602  BNP 1,496.7* 2,182.7* 123456*   Basic Metabolic Panel: Recent Labs  Lab 11/04/22 0733 11/05/22 0209  NA 143 140  K 4.3 4.0  CL 105 105  CO2 24 23  GLUCOSE 144* 202*  BUN 30* 35*  CREATININE 3.87* 5.01*  CALCIUM 10.3 8.7*  PHOS  --  5.1*   Liver Function Tests: Recent Labs  Lab 11/04/22 0733 11/05/22 0209  AST 19 12*  ALT 8 9  ALKPHOS 80 59  BILITOT 1.5* 1.3*  PROT 8.4* 5.8*  ALBUMIN 4.3 2.8*   Recent Labs  Lab 11/04/22 0733  LIPASE 11   No results for input(s): "AMMONIA" in the last 168 hours. CBC: Recent Labs  Lab 11/04/22 0733 11/05/22 0209  WBC 7.6 7.6  NEUTROABS 5.5  --   HGB 11.5* 9.8*  HCT 35.2* 30.9*  MCV 89.8 93.6  PLT 189 178   Cardiac Enzymes: No results for input(s): "CKTOTAL", "CKMB", "CKMBINDEX", "TROPONINI" in the last 168 hours. BNP: Invalid input(s): "POCBNP" CBG: Recent Labs  Lab 11/04/22 0735 11/04/22 2049 11/05/22 0610 11/05/22 1127  GLUCAP 140* 224* 199* 189*   D-Dimer No results for input(s): "DDIMER" in the last 72 hours. Hgb A1c No results for input(s): "HGBA1C" in the last 72 hours. Lipid Profile No results for input(s): "CHOL", "HDL", "LDLCALC", "TRIG", "CHOLHDL", "LDLDIRECT" in the last 72 hours. Thyroid function studies No results for input(s): "TSH", "T4TOTAL", "T3FREE", "THYROIDAB" in the last 72 hours.  Invalid input(s): "FREET3" Anemia work up No results for input(s): "VITAMINB12", "FOLATE", "FERRITIN", "TIBC", "IRON", "RETICCTPCT" in the last 72 hours. Urinalysis    Component Value Date/Time   COLORURINE YELLOW 10/21/2022 1330   APPEARANCEUR CLEAR 10/21/2022 1330   LABSPEC 1.013 10/21/2022 1330   PHURINE 8.5 (H) 10/21/2022 1330   GLUCOSEU 250 (A) 10/21/2022 1330   HGBUR TRACE (A) 10/21/2022 1330   BILIRUBINUR NEGATIVE 10/21/2022 1330   BILIRUBINUR negative 08/30/2020 1631   KETONESUR NEGATIVE 10/21/2022 1330   PROTEINUR >300 (A) 10/21/2022 1330    UROBILINOGEN 0.2 08/30/2020 1631   UROBILINOGEN 1.0 08/11/2008 1745   NITRITE NEGATIVE 10/21/2022 1330   LEUKOCYTESUR NEGATIVE 10/21/2022 1330   Sepsis Labs Recent Labs  Lab 11/04/22 0733 11/05/22 0209  WBC 7.6 7.6   Microbiology Recent Results (from the past 240 hour(s))  Resp panel by RT-PCR (RSV, Flu A&B, Covid) Anterior Nasal Swab     Status: None   Collection Time: 11/04/22  1:20 PM   Specimen: Anterior Nasal Swab  Result Value Ref Range Status   SARS Coronavirus 2 by RT PCR NEGATIVE NEGATIVE Final    Comment: (NOTE) SARS-CoV-2 target nucleic acids are NOT DETECTED.  The SARS-CoV-2 RNA is generally detectable in upper respiratory specimens during the acute phase of infection. The lowest concentration of SARS-CoV-2 viral copies this assay can detect is 138 copies/mL. A negative result does not preclude SARS-Cov-2 infection and should not be used as the sole basis for treatment or other patient management decisions. A negative result may occur with  improper specimen collection/handling, submission of specimen other than nasopharyngeal swab, presence of viral mutation(s) within the areas targeted by this assay, and inadequate number of viral copies(<138 copies/mL). A negative result must be combined with clinical observations, patient history, and epidemiological information. The expected result is Negative.  Fact Sheet for Patients:  EntrepreneurPulse.com.au  Fact Sheet for Healthcare Providers:  IncredibleEmployment.be  This test is no t yet approved or cleared by the [place] FDA and  has been authorized for detection and/or diagnosis of SARS-CoV-2 by FDA under an Emergency Use Authorization (EUA). This EUA will remain  in effect (meaning this test can be used) for the duration of the COVID-19 declaration under Section 564(b)(1) of the Act, 21 U.S.C.section 360bbb-3(b)(1), unless the authorization is terminated  or  revoked sooner.       Influenza A by PCR NEGATIVE NEGATIVE Final   Influenza B by PCR NEGATIVE NEGATIVE Final    Comment: (NOTE) The Xpert Xpress SARS-CoV-2/FLU/RSV plus assay is intended as an aid in the diagnosis of influenza from Nasopharyngeal swab specimens and should not be used as a sole basis for treatment. Nasal washings and aspirates are unacceptable for Xpert Xpress SARS-CoV-2/FLU/RSV testing.  Fact Sheet for Patients: EntrepreneurPulse.com.au  Fact Sheet for Healthcare Providers: IncredibleEmployment.be  This test is not yet approved or cleared by the [place] FDA and has been authorized for detection and/or diagnosis of SARS-CoV-2 by FDA under an Emergency Use Authorization (EUA). This EUA will remain in effect (meaning this test can be used) for the duration of the COVID-19 declaration under Section 564(b)(1) of the Act, 21 U.S.C. section 360bbb-3(b)(1), unless the authorization is terminated or revoked.     Resp Syncytial Virus by PCR NEGATIVE NEGATIVE Final    Comment: (NOTE) Fact Sheet for Patients: EntrepreneurPulse.com.au  Fact Sheet for Healthcare Providers: IncredibleEmployment.be  This test is not yet approved or cleared by the [place] FDA and has been authorized for detection and/or diagnosis of SARS-CoV-2 by FDA under an Emergency Use Authorization (EUA). This EUA will remain in effect (meaning this test can be used) for the duration of the COVID-19 declaration under Section 564(b)(1) of the Act, 21 U.S.C. section 360bbb-3(b)(1), unless the authorization is terminated or revoked.  Performed at [facility], [address], [place], [place] [address]   MRSA Next Gen by PCR, Nasal     Status: None   Collection Time: 11/04/22  1:36 PM  Result Value Ref Range Status   MRSA by PCR Next Gen NOT DETECTED NOT DETECTED Final    Comment: (NOTE) The  GeneXpert MRSA Assay (FDA approved for NASAL specimens only), is one component of a comprehensive MRSA colonization surveillance program. It is not intended to diagnose MRSA infection nor to guide or monitor treatment for MRSA infections. Test performance is not FDA approved in patients less than 4 years old. Performed at [facility], [address]  [place], [place] [address]      Time coordinating discharge: 35 minutes  SIGNED:   Rodena Goldmann, DO [facility] 11/05/2022, 6:24 PM  If 7PM-7AM, please contact night-coverage [email or web address]

## 2022-11-05 NOTE — Evaluation (Signed)
Occupational Therapy Evaluation Patient Details Name: [name] MRN: [id number] DOB: [birth year] Today's Date: 11/05/2022   History of Present Illness 52 y.o. female presents to [facility] hospital on 11/04/2022 with intractable nausea and vomiting. Pt with abnormal chest CT with groundglass reticular changes. PMH includes DM, gastroparesis, HLD, HTN, diastolic CHF, anemia, ESRD, CAD.   Clinical Impression   Pt admitted for above diagnosis. PTA, patient lived with sons and was Mod I in bADLs/iADLs including functional ambulation. Patient currently presenting needing Min guard to complete ambulation and Supervision to complete bADLs seated at bedside. Patient is overall generally weak, has decreased strength to stand from low surfaces at home and would benefit from extra conditioning. Pt would benefit from continued acute skilled OT services to address above deficits and help transition to next level of care. Patient would benefit from post acute Home OT services to help maximize functional independence in natural environment      Recommendations for follow up therapy are one component of a multi-disciplinary discharge planning process, led by the attending physician.  Recommendations may be updated based on patient status, additional functional criteria and insurance authorization.   Assistance Recommended at Discharge Intermittent Supervision/Assistance  Patient can return home with the following A little help with walking and/or transfers;A little help with bathing/dressing/bathroom;Assistance with cooking/housework;Assist for transportation;Help with stairs or ramp for entrance    Functional Status Assessment     Equipment Recommendations  BSC/3in1    Recommendations for Other Services       Precautions / Restrictions Precautions Precautions: Fall;ICD/Pacemaker Precaution Comments: ICD from 2023 Restrictions Weight Bearing Restrictions: No      Mobility Bed Mobility Overal bed  mobility: Modified Independent Bed Mobility: Supine to Sit, Sit to Supine                Transfers Overall transfer level: Needs assistance Equipment used: Rolling walker (2 wheels) Transfers: Sit to/from Stand Sit to Stand: Min assist, From elevated surface           General transfer comment: Pt not able to complete sit>stand from low bed surface      Balance Overall balance assessment: Needs assistance Sitting-balance support: Feet unsupported Sitting balance-Leahy Scale: Good     Standing balance support: During functional activity, Bilateral upper extremity supported Standing balance-Leahy Scale: Poor                             ADL either performed or assessed with clinical judgement   ADL Overall ADL's : Needs assistance/impaired Eating/Feeding: Independent   Grooming: Sitting;Set up;Supervision/safety   Upper Body Bathing: Sitting;Supervision/ safety;Set up   Lower Body Bathing: Sitting/lateral leans;Min guard   Upper Body Dressing : Sitting;Supervision/safety;Modified independent Upper Body Dressing Details (indicate cue type and reason): Pt donned/doffed gown like jacket while sitting EOB Lower Body Dressing: Supervision/safety;Sitting/lateral leans Lower Body Dressing Details (indicate cue type and reason): Pt adjusted bilat socks while sitting EOB Toilet Transfer: Min guard;Ambulation;Rolling walker (2 wheels)   Toileting- Clothing Manipulation and Hygiene: Minimal assistance;Sit to/from stand   Tub/ Shower Transfer: Minimal assistance;Ambulation;Rollator (4 wheels)   Functional mobility during ADLs: Min guard;Rolling walker (2 wheels)       Vision         Perception     Praxis      Pertinent Vitals/Pain Pain Assessment Pain Assessment: 0-10 Pain Score: 7  Pain Location: back Pain Descriptors / Indicators: Aching Pain Intervention(s): Limited activity within patient's tolerance, Monitored  during session     Hand  Dominance Right   Extremity/Trunk Assessment Upper Extremity Assessment Upper Extremity Assessment: Generalized weakness RUE Deficits / Details: Baseline peripheral neuropathy LUE Deficits / Details: Baseline peripheral neuropathy   Lower Extremity Assessment Lower Extremity Assessment: Defer to PT evaluation RLE Deficits / Details: Baseline peripheral neuropathy LLE Deficits / Details: Baseline peripheral neuropathy   Cervical / Trunk Assessment Cervical / Trunk Assessment: Normal   Communication Communication Communication: No difficulties   Cognition Arousal/Alertness: Awake/alert Behavior During Therapy: WFL for tasks assessed/performed Overall Cognitive Status: Within Functional Limits for tasks assessed                                       General Comments  VSS on RA, HR at 114 bpm following functional ambulation    Exercises     Shoulder Instructions      Home Living Family/patient expects to be discharged to:: Private residence Living Arrangements: Children Available Help at Discharge: Family;Available PRN/intermittently Type of Home: House Home Access: Stairs to enter [employer] of Steps: 3 Entrance Stairs-Rails: Right;Left;Can reach both Home Layout: One level     Bathroom Shower/Tub: Occupational psychologist: Standard     Home Equipment: Rollator (4 wheels)   Additional Comments: Pt reports difficulty getting on/off toilet a home      Prior Functioning/Environment Prior Level of Function : Independent/Modified Independent             Mobility Comments: Pt reports being Mod I in mobility around the home, ambulating with Rollator ADLs Comments: Independent in bADLs/IADLs. Sleeps in recliner        OT Problem List: Decreased strength;Decreased activity tolerance;Impaired balance (sitting and/or standing);Decreased knowledge of use of DME or AE      OT Treatment/Interventions: Self-care/ADL  training;Therapeutic exercise;DME and/or AE instruction;Therapeutic activities;Patient/family education;Balance training;Energy conservation    OT Goals(Current goals can be found in the care plan section) Acute Rehab OT Goals Patient Stated Goal: go home OT Goal Formulation: With patient Time For Goal Achievement: 11/19/22 Potential to Achieve Goals: Good  OT Frequency: Min 1X/week    Co-evaluation              AM-PAC OT "6 Clicks" Daily Activity     Outcome Measure Help from another person eating meals?: None Help from another person taking care of personal grooming?: A Little Help from another person toileting, which includes using toliet, bedpan, or urinal?: A Little Help from another person bathing (including washing, rinsing, drying)?: A Little Help from another person to put on and taking off regular upper body clothing?: A Little Help from another person to put on and taking off regular lower body clothing?: A Little 6 Click Score: 19   End of Session Equipment Utilized During Treatment: Gait belt;Rolling walker (2 wheels) Nurse Communication: Mobility status  Activity Tolerance: Patient tolerated treatment well Patient left: with call bell/phone within reach;in bed  OT Visit Diagnosis: Unsteadiness on feet (R26.81);Other abnormalities of gait and mobility (R26.89);Muscle weakness (generalized) (M62.81)                Time: [id number] OT Time Calculation (min): 28 min Charges:  OT General Charges $OT Visit: 1 Visit OT Evaluation $OT Eval Moderate Complexity: 1 Mod OT Treatments $Therapeutic Activity: 8-22 mins  11/05/2022  AB, OTR/L  Acute Rehabilitation Services  Office: [phone]   Cori Razor 11/05/2022,  9:30 AM

## 2022-11-05 NOTE — Evaluation (Signed)
Physical Therapy Evaluation Patient Details Name: [name] MRN: [id number] DOB: 10-May-1971 Today's Date: 11/05/2022  History of Present Illness  52 y.o. female presents to [facility] hospital on 11/04/2022 with intractable nausea and vomiting. Pt with abnormal chest CT with groundglass reticular changes. PMH includes DM, gastroparesis, HLD, HTN, diastolic CHF, anemia, ESRD, CAD.  Clinical Impression  Pt presents to PT with deficits in cardiopulmonary function, endurance, strength, power, gait, balance. Pt is able to ambulate for limited community distances with support of rollator. Pt reports this is the farthest she has been able to walk in a while. PT encourages frequent mobilization in an effort to continue to improve activity tolerance. 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     Recommendations for follow up therapy are one component of a multi-disciplinary discharge planning process, led by the attending physician.  Recommendations may be updated based on patient status, additional functional criteria and insurance authorization.  Follow Up Recommendations       Assistance Recommended at Discharge Intermittent Supervision/Assistance  Patient can return home with the following  A little help with bathing/dressing/bathroom;Assistance with cooking/housework;Assist for transportation;Help with stairs or ramp for entrance    Equipment Recommendations None recommended by PT  Recommendations for Other Services       Functional Status Assessment Patient has had a recent decline in their functional status and demonstrates the ability to make significant improvements in function in a reasonable and predictable amount of time.     Precautions / Restrictions Precautions Precautions: Fall Precaution Comments: ICD from 2023 Restrictions Weight Bearing Restrictions: No       Mobility  Bed Mobility Overal bed mobility: Modified Independent                  Transfers Overall transfer level: Needs assistance Equipment used: Rollator (4 wheels) Transfers: Sit to/from Stand Sit to Stand: Supervision                Ambulation/Gait Ambulation/Gait assistance: Supervision Gait Distance (Feet): 300 Feet Assistive device: Rollator (4 wheels) Gait Pattern/deviations: Step-through pattern Gait velocity: decr Gait velocity interpretation: <1.8 ft/sec, indicate of risk for recurrent falls   General Gait Details: slowed step-through gait  Stairs            Wheelchair Mobility    Modified Rankin (Stroke Patients Only)       Balance Overall balance assessment: Needs assistance Sitting-balance support: No upper extremity supported, Feet supported Sitting balance-Leahy Scale: Good     Standing balance support: Single extremity supported, Reliant on assistive device for balance Standing balance-Leahy Scale: Poor                               Pertinent Vitals/Pain Pain Assessment Pain Assessment: 0-10 Pain Score: 7  Pain Location: back Pain Descriptors / Indicators: Aching Pain Intervention(s): Monitored during session    Home Living Family/patient expects to be discharged to:: Private residence Living Arrangements: Children Available Help at Discharge: Family;Available PRN/intermittently Type of Home: House Home Access: Stairs to enter Entrance Stairs-Rails: Right;Left;Can reach both Entrance Stairs-Number of Steps: 3   Home Layout: One level Home Equipment: Rollator (4 wheels) Additional Comments: Pt reports difficulty getting on/off toilet a home    Prior Function Prior Level of Function : Independent/Modified Independent  Mobility Comments: pt reports ambulating with rollator for household distances. She states that she has not been out of the home much recently other than to doctors  appointments and dialysis ADLs Comments: Independent in bADLs/IADLs. Sleeps in recliner     Hand Dominance   Dominant Hand: Right    Extremity/Trunk Assessment   Upper Extremity Assessment Upper Extremity Assessment: Generalized weakness RUE Deficits / Details: Baseline peripheral neuropathy LUE Deficits / Details: Baseline peripheral neuropathy    Lower Extremity Assessment Lower Extremity Assessment: Generalized weakness RLE Deficits / Details: Baseline peripheral neuropathy LLE Deficits / Details: Baseline peripheral neuropathy    Cervical / Trunk Assessment Cervical / Trunk Assessment: Normal  Communication   Communication: No difficulties  Cognition Arousal/Alertness: Awake/alert Behavior During Therapy: WFL for tasks assessed/performed Overall Cognitive Status: Within Functional Limits for tasks assessed                                          General Comments General comments (skin integrity, edema, etc.): VSS on RA, pt denies SOB when mobilizing, HR in 110s when ambulating    Exercises     Assessment/Plan    PT Assessment Patient needs continued PT services  PT Problem List Decreased strength;Decreased activity tolerance;Decreased balance;Decreased mobility;Cardiopulmonary status limiting activity       PT Treatment Interventions Gait training;DME instruction;Functional mobility training;Therapeutic activities;Therapeutic exercise;Balance training;Neuromuscular re-education;Patient/family education;Stair training    PT Goals (Current goals can be found in the Care Plan section)  Acute Rehab PT Goals Patient Stated Goal: to improve activity tolerance PT Goal Formulation: With patient Time For Goal Achievement: 11/19/22 Potential to Achieve Goals: Fair    Frequency Min 2X/week     Co-evaluation               AM-PAC PT "6 Clicks" Mobility  Outcome Measure Help needed turning from your back to your side while in a flat bed  without using bedrails?: None Help needed moving from lying on your back to sitting on the side of a flat bed without using bedrails?: None Help needed moving to and from a bed to a chair (including a wheelchair)?: A Little Help needed standing up from a chair using your arms (e.g., wheelchair or bedside chair)?: A Little Help needed to walk in hospital room?: A Little Help needed climbing 3-5 steps with a railing? : A Lot 6 Click Score: 19    End of Session   Activity Tolerance: Patient tolerated treatment well Patient left: in chair;with call bell/phone within reach Nurse Communication: Mobility status PT Visit Diagnosis: Other abnormalities of gait and mobility (R26.89);Muscle weakness (generalized) (M62.81)    Time: [id number] PT Time Calculation (min) (ACUTE ONLY): 20 min   Charges:   PT Evaluation $PT Eval Low Complexity: [place], PT, DPT Acute Rehabilitation Office [phone]   Zenaida Niece 11/05/2022, 10:19 AM

## 2022-11-05 NOTE — Progress Notes (Signed)
D/C order noted when reviewing active HD orders. Contacted [facility] to advise clinic of pt's d/c today and that pt should resume care tomorrow.   Melven Sartorius Renal Navigator [phone]

## 2022-11-05 NOTE — Consult Note (Signed)
Renal Service Consult Note [facility]  [place] 11/05/2022 Sol Blazing, MD Requesting Physician: Dr. Heath Lark  Reason for Consult: ESRD pt w/ intractable N/V HPI: The patient is a 52 y.o. year-old w/ PMH as below who presented w/ severe N/V ongoing for several days at home.  Pt has hx of T1DM and gastroparesis. Missed HD yesterday, is esrd pt on HD MWF.  Pt is admitted and getting symptomatic Rx for N/V.  We are asked to see for dialysis.   Pt seen in room. No c/o's except the N/V. This is a chronic and recurrent issue. Sometimes dialysis will trigger N/V post HD.  Other times not related to HD.  No abd pain or CP or SOB.    ROS - denies CP, no joint pain, no HA, no blurry vision, no rash, no diarrhea, no nausea/ vomiting, no dysuria, no difficulty voiding   Past Medical History  Past Medical History:  Diagnosis Date   Anemia    Anxiety    Arthritis    Asthma    CHF (congestive heart failure)    Chronic diastolic CHF (congestive heart failure) 12/14/2019   Depression    Diabetic ulcer of left foot 05/12/2013   ESRD on hemodialysis    MWF at [facility]   Gastroparesis    Generalized abdominal pain    History of chicken pox    Loss of weight 12/02/2019   Migraines    Mixed hyperlipidemia 09/05/2022   Mood disorder    anxiety   Myocardial infarction    Prurigo nodularis    with diabetic dermopathy   Type 1 diabetes mellitus 05/23/2022   Type 1 diabetes, uncontrolled, with neuropathy    [place]   Ulcers of both lower legs 02/20/2014   Past Surgical History  Past Surgical History:  Procedure Laterality Date   AV FISTULA PLACEMENT Left 10/01/2022   Procedure: ARTERIOVENOUS (AV)FISTULA CREATION;  Surgeon: Rosetta Posner, MD;  Location: [facility];  Service: Vascular;  Laterality: Left;   BIOPSY  08/11/2020   Procedure: BIOPSY;  Surgeon: Ladene Artist, MD;  Location: [facility];  Service: Endoscopy;;   BIOPSY  04/02/2022   Procedure: BIOPSY;   Surgeon: Jackquline Denmark, MD;  Location: [facility];  Service: Gastroenterology;;   ESOPHAGOGASTRODUODENOSCOPY (EGD) WITH PROPOFOL N/A 08/11/2020   Procedure: ESOPHAGOGASTRODUODENOSCOPY (EGD) WITH PROPOFOL;  Surgeon: Ladene Artist, MD;  Location: [facility];  Service: Endoscopy;  Laterality: N/A;   ESOPHAGOGASTRODUODENOSCOPY (EGD) WITH PROPOFOL N/A 04/02/2022   Procedure: ESOPHAGOGASTRODUODENOSCOPY (EGD) WITH PROPOFOL;  Surgeon: Jackquline Denmark, MD;  Location: [facility];  Service: Gastroenterology;  Laterality: N/A;   ESOPHAGOGASTRODUODENOSCOPY (EGD) WITH PROPOFOL N/A 05/23/2022   Procedure: ESOPHAGOGASTRODUODENOSCOPY (EGD) WITH PROPOFOL;  Surgeon: Lucilla Lame, MD;  Location: [facility];  Service: Endoscopy;  Laterality: N/A;   ICD IMPLANT N/A 04/09/2022   Procedure: ICD IMPLANT;  Surgeon: Constance Haw, MD;  Location: [place] [facility];  Service: Cardiovascular;  Laterality: N/A;   IR FLUORO GUIDE CV LINE RIGHT  10/07/2022   IR US GUIDE VASC ACCESS RIGHT  10/07/2022   treadmill stress test  01/2013   WNL, low risk study   Family History  Family History  Problem Relation Age of Onset   Diabetes Mother        type 2   Hypertension Mother    Thyroid disease Mother    Bipolar disorder Mother    Heart disease Mother    Calcium disorder Mother  Cancer Maternal Grandmother        Breast, stomach   Cancer Paternal Grandmother        stomach, lung (smoker)   Diabetes Paternal Grandmother    Diabetes Paternal Grandfather    Heart failure Sister    Diabetes Sister    Stroke Maternal Aunt    Cancer Maternal Uncle        prostate   CAD Maternal Aunt        stents   Cancer Maternal Aunt 64       ovarian   Social History  reports that she has never smoked. She has never used smokeless tobacco. She reports that she does not currently use alcohol. She reports that she does not use drugs. Allergies  Allergies  Allergen Reactions   Atorvastatin Other (See Comments)    Dizziness     Dulaglutide Nausea And Vomiting and Other (See Comments)    Caused pancreatitis    Metoclopramide Other (See Comments)    Possible movement disorder or tardive dyskinesia while on reglan   Other Itching    Peas and carrots   Sertraline Nausea And Vomiting   Vancomycin Itching and Swelling   Zofran [Ondansetron] Nausea And Vomiting and Other (See Comments)    Causes nausea vomiting to worsen / doesn't really work for patient.   Amoxicillin Itching and Rash   Lantus [Insulin Glargine] Itching and Rash    Has tolerated insulin glargine many times with no reported issues   Miconazole Nitrate Nausea And Vomiting and Rash   Home medications Prior to Admission medications   Medication Sig Start Date End Date Taking? Authorizing Provider  acetaminophen (TYLENOL) 500 MG tablet Take 1,000 mg by mouth as needed for moderate pain or mild pain.    [provider]  albuterol (PROAIR HFA) 108 (90 Base) MCG/ACT inhaler Inhale 2 puffs into the lungs every 6 (six) hours as needed for wheezing or shortness of breath. Patient taking differently: Inhale 2 puffs into the lungs as needed for wheezing or shortness of breath. 07/03/20   [name], PA-C  amLODipine (NORVASC) 10 MG tablet Take 1 tablet (10 mg total) by mouth daily. 10/16/22 11/15/22  Darliss Cheney, MD  aspirin EC 81 MG tablet Take 1 tablet (81 mg total) by mouth daily. Swallow whole. 10/16/22   Darliss Cheney, MD  Ensure Max Protein (ENSURE MAX PROTEIN) LIQD Take 330 mLs (11 oz total) by mouth daily. Patient not taking: Reported on 10/09/2022 04/30/22   Modena Jansky, MD  hydrALAZINE (APRESOLINE) 100 MG tablet Take 1 tablet (100 mg total) by mouth every 8 (eight) hours. 10/15/22 11/14/22  Darliss Cheney, MD  hydrOXYzine (ATARAX) 50 MG tablet Take 50 mg by mouth in the morning, at noon, and at bedtime.    [provider]  insulin aspart (NOVOLOG) 100 UNIT/ML injection Inject 150 Units into the skin See admin instructions.  Patient uses this with the [place]. Units vary according to blood sugar. Loads 150 units every 3 days.    [provider]  isosorbide dinitrate (ISORDIL) 20 MG tablet Take 1 tablet (20 mg total) by mouth 3 (three) times daily. 10/15/22 11/14/22  Darliss Cheney, MD  LORazepam (ATIVAN) 0.5 MG tablet Take 1 tablet (0.5 mg total) by mouth every 8 (eight) hours as needed for anxiety (Refractory nausea and vomiting). Patient taking differently: Take 0.5 mg by mouth 4 (four) times daily as needed for anxiety (Refractory nausea and vomiting). 07/17/22   Domenic Polite, MD  metoprolol succinate (TOPROL-XL) 50 MG 24 hr tablet Take 1 tablet (50 mg total) by mouth daily. Take with or immediately following a meal. 09/05/22   Chandrasekhar, Mahesh A, MD  nitroGLYCERIN (NITROSTAT) 0.4 MG SL tablet Place 1 tablet (0.4 mg total) under the tongue every 5 (five) minutes as needed for chest pain. 09/05/22 12/04/22  Werner Lean, MD  pantoprazole (PROTONIX) 40 MG tablet Take 1 tablet (40 mg total) by mouth 2 (two) times daily before a meal. 05/24/22 10/09/22  Rai, Ripudeep K, MD  polyethylene glycol (MIRALAX / GLYCOLAX) 17 g packet Take 17 g by mouth daily as needed for moderate constipation.    [provider]  pravastatin (PRAVACHOL) 40 MG tablet Take 1 tablet (40 mg total) by mouth every evening. 09/05/22   Chandrasekhar, Lyda Kalata A, MD  pregabalin (LYRICA) 50 MG capsule Take 50 mg by mouth as needed (neuropathy). 12/05/21   [provider]  prochlorperazine (COMPAZINE) 10 MG tablet Take 1 tablet (10 mg total) by mouth every 6 (six) hours as needed for up to 7 days for nausea or vomiting. 10/23/22 10/30/22  Dahal, Marlowe Aschoff, MD  promethazine (PHENERGAN) 25 MG suppository Unwrap and place 1 suppository (25 mg total) rectally every 6 (six) hours as needed for nausea or vomiting. Patient taking differently: Place 25 mg rectally as needed for nausea or vomiting. 07/17/22   Domenic Polite, MD  scopolamine  (TRANSDERM-SCOP) 1 MG/3DAYS Place 1 patch (1.5 mg total) onto the skin every 3 (three) days. 05/26/22   Rai, Ripudeep K, MD  tobramycin (TOBREX) 0.3 % ophthalmic solution Place 1-2 drops into both eyes See admin instructions. Instill 1-2 drops into both eyes 3 times daily the day before, day of, and the day after every 6 weeks eye injections per patient 11/17/20   [provider]     Vitals:   11/05/22 0031 11/05/22 0505 11/05/22 0718 11/05/22 0723  BP: (!) 134/59 (!) 148/63 (!) 168/92 (!) 153/71  Pulse: 90 89 96 95  Resp: 16 16  17   Temp: 98.6 F (37 C) 98.6 F (37 C) 97.7 F (36.5 C) 98.6 F (37 C)  TempSrc: Oral Oral Oral Oral  SpO2: 98% 97% 100% 99%  Weight:  79.7 kg    Height:       Exam Gen alert, no distress, RA No rash, cyanosis or gangrene Sclera anicteric, throat clear  No jvd or bruits Chest clear bilat to bases, no rales/ wheezing RRR no MRG Abd soft ntnd no mass or ascites +bs GU defer MS no joint effusions or deformity Ext no LE or UE edema, no wounds or ulcers Neuro is alert, Ox 3 , nf    RIJ TDC in place      Home meds include - lyrica, norvasc 10, asa, hydralazine 100 tid, isordil 20 tid, toprol xl 50 qd, protonix, pravachol, prns/ vits/ supps     OP HD: [place] M[facility] 4h  400/600  80kg   3K/2.5Ca bath  RIJ TDC   Heparin 3000 - last HD 3/29, post wt 78.5kg - rocaltrol 0.5 mcg po tiw - venofer 100mg  q hd thru 4/10 - mircera 150 mcg q 2 wks, last 3/27, due 4/10   Assessment/ Plan: Intractable N/V - hx gastroparesis, T1DM. Per pmd ESRD - on HD MWF. Missed HD 4/01. Plan HD today and tomorrow.  HTN/ volume - bp's normal here. Last OP HD came off 1.5 under dry wt. Will lower dry wt while here. UF goal 1 L today  and 1 L tomorrow.  Anemia esrd - Hb 9.8- 11 here - will cont OP IV Fe load thru 4/10. Next esa due 4/10, follow.   MBD ckd - CCa in range, add on phos. Cont po vdra.  T1DM - per pmd      Kelly Splinter  MD [facility] 11/05/2022, 9:21 AM  Recent  Labs  Lab 11/04/22 0733 11/05/22 0209  HGB 11.5* 9.8*  ALBUMIN 4.3 2.8*  CALCIUM 10.3 8.7*  CREATININE 3.87* 5.01*  K 4.3 4.0   Inpatient medications:  amLODipine  10 mg Oral Daily   aspirin EC  81 mg Oral Daily   Chlorhexidine Gluconate Cloth  6 each Topical Daily   heparin  5,000 Units Subcutaneous Q8H   hydrALAZINE  100 mg Oral Q8H   insulin aspart  0-6 Units Subcutaneous TID WC   isosorbide dinitrate  20 mg Oral TID   metoprolol succinate  50 mg Oral Daily   pantoprazole  40 mg Oral Daily   pravastatin  40 mg Oral QPM   sodium chloride flush  3 mL Intravenous Q12H    sodium chloride 10 mL/hr at 11/04/22 1309   sodium chloride, acetaminophen **OR** acetaminophen, albuterol, LORazepam, polyethylene glycol, prochlorperazine

## 2022-11-05 NOTE — Progress Notes (Signed)
Received patient in bed.Awake,alert and oriented x 4 herniated disc HD consent verified.  Access used : Right HD catheter that worked well.Dressing changed done today.  Medicine given : Heparin 4,000 units pre -run  Duration of treatment: 3.5 hours.  Fluid removed :Achieved 1 liter fluid goal.  Hemo issue/comment:She tolerated the treatment well.  Hand off to the patient's nurse.

## 2022-11-06 DIAGNOSIS — N186 End stage renal disease: Secondary | ICD-10-CM | POA: Diagnosis not present

## 2022-11-06 DIAGNOSIS — Z992 Dependence on renal dialysis: Secondary | ICD-10-CM | POA: Diagnosis not present

## 2022-11-08 DIAGNOSIS — Z992 Dependence on renal dialysis: Secondary | ICD-10-CM | POA: Diagnosis not present

## 2022-11-08 DIAGNOSIS — N186 End stage renal disease: Secondary | ICD-10-CM | POA: Diagnosis not present

## 2022-11-11 DIAGNOSIS — N186 End stage renal disease: Secondary | ICD-10-CM | POA: Diagnosis not present

## 2022-11-11 DIAGNOSIS — Z992 Dependence on renal dialysis: Secondary | ICD-10-CM | POA: Diagnosis not present

## 2022-11-12 ENCOUNTER — Other Ambulatory Visit: Payer: Self-pay | Admitting: *Deleted

## 2022-11-12 DIAGNOSIS — N184 Chronic kidney disease, stage 4 (severe): Secondary | ICD-10-CM

## 2022-11-13 DIAGNOSIS — Z992 Dependence on renal dialysis: Secondary | ICD-10-CM | POA: Diagnosis not present

## 2022-11-13 DIAGNOSIS — N186 End stage renal disease: Secondary | ICD-10-CM | POA: Diagnosis not present

## 2022-11-14 ENCOUNTER — Ambulatory Visit (INDEPENDENT_AMBULATORY_CARE_PROVIDER_SITE_OTHER)
Admission: RE | Admit: 2022-11-14 | Discharge: 2022-11-14 | Disposition: A | Discharge status: Home / Self Care | Payer: BC Managed Care – PPO | Source: Ambulatory Visit | Attending: Vascular Surgery | Admitting: Vascular Surgery

## 2022-11-14 ENCOUNTER — Encounter: Payer: Self-pay | Admitting: Vascular Surgery

## 2022-11-14 ENCOUNTER — Ambulatory Visit (INDEPENDENT_AMBULATORY_CARE_PROVIDER_SITE_OTHER): Payer: BC Managed Care – PPO | Admitting: Vascular Surgery

## 2022-11-14 ENCOUNTER — Ambulatory Visit (HOSPITAL_COMMUNITY)
Admission: RE | Admit: 2022-11-14 | Discharge: 2022-11-14 | Disposition: A | Discharge status: Home / Self Care | Payer: BC Managed Care – PPO | Source: Ambulatory Visit | Attending: Vascular Surgery | Admitting: Vascular Surgery

## 2022-11-14 ENCOUNTER — Other Ambulatory Visit: Payer: Self-pay

## 2022-11-14 VITALS — BP 145/92 | HR 101 | Temp 98.0°F | Resp 20 | Ht 72.0 in | Wt 171.0 lb

## 2022-11-14 DIAGNOSIS — N186 End stage renal disease: Secondary | ICD-10-CM

## 2022-11-14 DIAGNOSIS — Z992 Dependence on renal dialysis: Secondary | ICD-10-CM

## 2022-11-14 DIAGNOSIS — N184 Chronic kidney disease, stage 4 (severe): Secondary | ICD-10-CM

## 2022-11-14 NOTE — H&P (View-Only) (Signed)
REASON FOR VISIT:   To evaluate for hemodialysis access.  MEDICAL ISSUES:   END-STAGE RENAL DISEASE: This patient presents for evaluation for new access.  She has a clotted for stage left basilic vein transposition.  She has a defibrillator on the left.  Therefore I would recommend placing access in the right arm.  Based on her vein map she is not a candidate for AV fistula.  She will likely require a upper arm graft although potentially she could have a forearm graft if her antecubital veins were adequate in size.  I have scheduled her for a procedure for 11/19/2022 which is a nondialysis day.  I have explained the indications for placement of an AV graft.  I have reviewed the potential complications of placement of an AV graft. These risks include, but are not limited to, graft thrombosis, graft infection, wound healing problems, bleeding, arm swelling, and steal syndrome. All the patient's questions were answered and they are agreeable to proceed with surgery.   HPI:   [name] is a pleasant 52 y.o. female who was seen by Dr. Tawanna Cooler Early on 08/28/2022 to discuss options for hemodialysis access.  On exam at that time, she had palpable radial pulses.  She has very small veins on exam.  He did not feel that she would be a fistula candidate.  We discussed placing an AV graft.  The plan was to wait until she was nearing dialysis before proceeding with surgery.  She subsequently had a for stage basilic vein transposition placed on 10/01/2022.  She has a defibrillator on the left and apparently had some issues with her defibrillator firing after surgery.  This has been corrected reportedly.  She dialyzes on Monday Wednesdays and Fridays.  She has a functioning right IJ tunneled dialysis catheter.  She has a history of significant gastroparesis.  Past Medical History:  Diagnosis Date   Anemia    Anxiety    Arthritis    Asthma    CHF (congestive heart failure)    Chronic diastolic CHF  (congestive heart failure) 12/14/2019   Depression    Diabetic ulcer of left foot 05/12/2013   ESRD on hemodialysis    MWF at [facility]   Gastroparesis    Generalized abdominal pain    History of chicken pox    Loss of weight 12/02/2019   Migraines    Mixed hyperlipidemia 09/05/2022   Mood disorder    anxiety   Myocardial infarction    Prurigo nodularis    with diabetic dermopathy   Type 1 diabetes mellitus 05/23/2022   Type 1 diabetes, uncontrolled, with neuropathy    [place]   Ulcers of both lower legs 02/20/2014    Family History  Problem Relation Age of Onset   Diabetes Mother        type 2   Hypertension Mother    Thyroid disease Mother    Bipolar disorder Mother    Heart disease Mother    Calcium disorder Mother    Cancer Maternal Grandmother        Breast, stomach   Cancer Paternal Grandmother        stomach, lung (smoker)   Diabetes Paternal Grandmother    Diabetes Paternal Grandfather    Heart failure Sister    Diabetes Sister    Stroke Maternal Aunt    Cancer Maternal Uncle        prostate   CAD Maternal Aunt        stents  Cancer Maternal Aunt 84       ovarian    SOCIAL HISTORY: Social History   Tobacco Use   Smoking status: Never   Smokeless tobacco: Never  Substance Use Topics   Alcohol use: Not Currently    Allergies  Allergen Reactions   Atorvastatin Other (See Comments)    Dizziness    Dulaglutide Nausea And Vomiting and Other (See Comments)    Caused pancreatitis    Metoclopramide Other (See Comments)    Possible movement disorder or tardive dyskinesia while on reglan   Other Itching    Peas and carrots   Sertraline Nausea And Vomiting   Vancomycin Itching and Swelling   Zofran [Ondansetron] Nausea And Vomiting and Other (See Comments)    Causes nausea vomiting to worsen / doesn't really work for patient.   Amoxicillin Itching and Rash   Lantus [Insulin Glargine] Itching and Rash    Has tolerated insulin glargine many times  with no reported issues   Miconazole Nitrate Nausea And Vomiting and Rash    Current Outpatient Medications  Medication Sig Dispense Refill   acetaminophen (TYLENOL) 500 MG tablet Take 1,000 mg by mouth as needed for moderate pain or mild pain.     albuterol (PROAIR HFA) 108 (90 Base) MCG/ACT inhaler Inhale 2 puffs into the lungs every 6 (six) hours as needed for wheezing or shortness of breath. 6.7 g 1   amLODipine (NORVASC) 10 MG tablet Take 1 tablet (10 mg total) by mouth daily. 30 tablet 0   aspirin EC 81 MG tablet Take 1 tablet (81 mg total) by mouth daily. Swallow whole. 30 tablet 12   cloNIDine (CATAPRES - DOSED IN MG/24 HR) 0.1 mg/24hr patch Place 0.1 mg onto the skin See admin instructions. Apply 1 patch into the skin once weekly when can't swallow blood pressure pills     hydrALAZINE (APRESOLINE) 100 MG tablet Take 1 tablet (100 mg total) by mouth every 8 (eight) hours. 90 tablet 0   hydrOXYzine (ATARAX) 50 MG tablet Take 50 mg by mouth in the morning, at noon, and at bedtime.     insulin aspart (NOVOLOG) 100 UNIT/ML injection Inject 150 Units into the skin See admin instructions. Patient uses this with the [employer]. Units vary according to blood sugar. Loads 150 units every 3 days.     isosorbide dinitrate (ISORDIL) 20 MG tablet Take 1 tablet (20 mg total) by mouth 3 (three) times daily. 90 tablet 0   LORazepam (ATIVAN) 0.5 MG tablet Take 1 tablet (0.5 mg total) by mouth every 8 (eight) hours as needed for anxiety (Refractory nausea and vomiting). (Patient taking differently: Take 0.5 mg by mouth in the morning, at noon, in the evening, and at bedtime.) 30 tablet 0   metoprolol succinate (TOPROL-XL) 50 MG 24 hr tablet Take 1 tablet (50 mg total) by mouth daily. Take with or immediately following a meal. (Patient taking differently: Take 50 mg by mouth daily.) 90 tablet 3   nitroGLYCERIN (NITROSTAT) 0.4 MG SL tablet Place 1 tablet (0.4 mg total) under the tongue every 5 (five) minutes as  needed for chest pain. (Patient taking differently: Place 0.4 mg under the tongue every 5 (five) minutes as needed for chest pain (max 3 doses. call 911).) 90 tablet 3   polyethylene glycol (MIRALAX / GLYCOLAX) 17 g packet Take 17 g by mouth daily as needed for moderate constipation.     pregabalin (LYRICA) 50 MG capsule Take 50 mg by mouth as needed (  neuropathy).     promethazine (PHENERGAN) 25 MG suppository Unwrap and place 1 suppository (25 mg total) rectally every 6 (six) hours as needed for nausea or vomiting. 30 each 2   rosuvastatin (CRESTOR) 20 MG tablet Take 20 mg by mouth daily.     scopolamine (TRANSDERM-SCOP) 1 MG/3DAYS Place 1 patch (1.5 mg total) onto the skin every 3 (three) days. 24 patch 4   pantoprazole (PROTONIX) 40 MG tablet Take 1 tablet (40 mg total) by mouth 2 (two) times daily before a meal. (Patient taking differently: Take 40 mg by mouth 2 (two) times daily as needed (heartburn).) 60 tablet 1   prochlorperazine (COMPAZINE) 10 MG tablet Take 1 tablet (10 mg total) by mouth every 6 (six) hours as needed for up to 7 days for nausea or vomiting. 28 tablet 0   No current facility-administered medications for this visit.    REVIEW OF SYSTEMS:  [X]  denotes positive finding, [ ]  denotes negative finding Cardiac  Comments:  Chest pain or chest pressure:    Shortness of breath upon exertion:    Short of breath when lying flat:    Irregular heart rhythm:        Vascular    Pain in calf, thigh, or hip brought on by ambulation:    Pain in feet at night that wakes you up from your sleep:     Blood clot in your veins:    Leg swelling:         Pulmonary    Oxygen at home:    Productive cough:     Wheezing:         Neurologic    Sudden weakness in arms or legs:     Sudden numbness in arms or legs:     Sudden onset of difficulty speaking or slurred speech:    Temporary loss of vision in one eye:     Problems with dizziness:         Gastrointestinal    Blood in stool:      Vomited blood:  x       Genitourinary    Burning when urinating:     Blood in urine:        Psychiatric    Major depression:         Hematologic    Bleeding problems:    Problems with blood clotting too easily:        Skin    Rashes or ulcers:        Constitutional    Fever or chills:     PHYSICAL EXAM:   Vitals:   11/14/22 0933  BP: (!) 145/92  Pulse: (!) 101  Resp: 20  Temp: 98 F (36.7 C)  SpO2: 98%  Weight: 171 lb (77.6 kg)  Height: 6' (1.829 m)    GENERAL: The patient is a well-nourished female, in no acute distress. The vital signs are documented above. CARDIAC: There is a regular rate and rhythm.  VASCULAR: She has palpable radial pulses bilaterally. She has no significant lower extremity swelling. PULMONARY: There is good air exchange bilaterally without wheezing or rales. MUSCULOSKELETAL: There are no major deformities or cyanosis. NEUROLOGIC: No focal weakness or paresthesias are detected. SKIN: There are no ulcers or rashes noted. PSYCHIATRIC: The patient has a normal affect.  DATA:    UPPER EXTREMITY VEIN MAP: I have independently interpreted her upper extremity vein map.  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  The  upper arm cephalic vein is very small with diameters ranging from 2.1-2.7 mm.  The basilic vein is small and not adequate.  On the left side the diameters of the cephalic vein ranged from 1 mm to 2 mm.  The patient has a thrombosed brachial basilic fistula on the left.  ARTERIAL DOPPLER STUDY: I have independently interpreted her arterial duplex study.  On the right side the brachial artery measures 4.5 mm in diameter.  There is a triphasic radial waveform and a biphasic ulnar waveform.  On the left side there is a triphasic radial and ulnar waveform.  The brachial artery measures 3.7 mm in diameter.   Waverly Ferrarihristopher Amaal Dimartino [facility] Office [phone]

## 2022-11-14 NOTE — Progress Notes (Signed)
REASON FOR VISIT:   To evaluate for hemodialysis access.  MEDICAL ISSUES:   END-STAGE RENAL DISEASE: This patient presents for evaluation for new access.  She has a clotted for stage left basilic vein transposition.  She has a defibrillator on the left.  Therefore I would recommend placing access in the right arm.  Based on her vein map she is not a candidate for AV fistula.  She will likely require a upper arm graft although potentially she could have a forearm graft if her antecubital veins were adequate in size.  I have scheduled her for a procedure for 11/19/2022 which is a nondialysis day.  I have explained the indications for placement of an AV graft.  I have reviewed the potential complications of placement of an AV graft. These risks include, but are not limited to, graft thrombosis, graft infection, wound healing problems, bleeding, arm swelling, and steal syndrome. All the patient's questions were answered and they are agreeable to proceed with surgery.   HPI:   [name] is a pleasant 52 y.o. female who was seen by Dr. Tawanna Cooler Early on 08/28/2022 to discuss options for hemodialysis access.  On exam at that time, she had palpable radial pulses.  She has very small veins on exam.  He did not feel that she would be a fistula candidate.  We discussed placing an AV graft.  The plan was to wait until she was nearing dialysis before proceeding with surgery.  She subsequently had a for stage basilic vein transposition placed on 10/01/2022.  She has a defibrillator on the left and apparently had some issues with her defibrillator firing after surgery.  This has been corrected reportedly.  She dialyzes on Monday Wednesdays and Fridays.  She has a functioning right IJ tunneled dialysis catheter.  She has a history of significant gastroparesis.  Past Medical History:  Diagnosis Date   Anemia    Anxiety    Arthritis    Asthma    CHF (congestive heart failure)    Chronic diastolic CHF  (congestive heart failure) 12/14/2019   Depression    Diabetic ulcer of left foot 05/12/2013   ESRD on hemodialysis    MWF at [facility]   Gastroparesis    Generalized abdominal pain    History of chicken pox    Loss of weight 12/02/2019   Migraines    Mixed hyperlipidemia 09/05/2022   Mood disorder    anxiety   Myocardial infarction    Prurigo nodularis    with diabetic dermopathy   Type 1 diabetes mellitus 05/23/2022   Type 1 diabetes, uncontrolled, with neuropathy    [place]   Ulcers of both lower legs 02/20/2014    Family History  Problem Relation Age of Onset   Diabetes Mother        type 2   Hypertension Mother    Thyroid disease Mother    Bipolar disorder Mother    Heart disease Mother    Calcium disorder Mother    Cancer Maternal Grandmother        Breast, stomach   Cancer Paternal Grandmother        stomach, lung (smoker)   Diabetes Paternal Grandmother    Diabetes Paternal Grandfather    Heart failure Sister    Diabetes Sister    Stroke Maternal Aunt    Cancer Maternal Uncle        prostate   CAD Maternal Aunt        stents  Cancer Maternal Aunt 84       ovarian    SOCIAL HISTORY: Social History   Tobacco Use   Smoking status: Never   Smokeless tobacco: Never  Substance Use Topics   Alcohol use: Not Currently    Allergies  Allergen Reactions   Atorvastatin Other (See Comments)    Dizziness    Dulaglutide Nausea And Vomiting and Other (See Comments)    Caused pancreatitis    Metoclopramide Other (See Comments)    Possible movement disorder or tardive dyskinesia while on reglan   Other Itching    Peas and carrots   Sertraline Nausea And Vomiting   Vancomycin Itching and Swelling   Zofran [Ondansetron] Nausea And Vomiting and Other (See Comments)    Causes nausea vomiting to worsen / doesn't really work for patient.   Amoxicillin Itching and Rash   Lantus [Insulin Glargine] Itching and Rash    Has tolerated insulin glargine many times  with no reported issues   Miconazole Nitrate Nausea And Vomiting and Rash    Current Outpatient Medications  Medication Sig Dispense Refill   acetaminophen (TYLENOL) 500 MG tablet Take 1,000 mg by mouth as needed for moderate pain or mild pain.     albuterol (PROAIR HFA) 108 (90 Base) MCG/ACT inhaler Inhale 2 puffs into the lungs every 6 (six) hours as needed for wheezing or shortness of breath. 6.7 g 1   amLODipine (NORVASC) 10 MG tablet Take 1 tablet (10 mg total) by mouth daily. 30 tablet 0   aspirin EC 81 MG tablet Take 1 tablet (81 mg total) by mouth daily. Swallow whole. 30 tablet 12   cloNIDine (CATAPRES - DOSED IN MG/24 HR) 0.1 mg/24hr patch Place 0.1 mg onto the skin See admin instructions. Apply 1 patch into the skin once weekly when can't swallow blood pressure pills     hydrALAZINE (APRESOLINE) 100 MG tablet Take 1 tablet (100 mg total) by mouth every 8 (eight) hours. 90 tablet 0   hydrOXYzine (ATARAX) 50 MG tablet Take 50 mg by mouth in the morning, at noon, and at bedtime.     insulin aspart (NOVOLOG) 100 UNIT/ML injection Inject 150 Units into the skin See admin instructions. Patient uses this with the Omnipod. Units vary according to blood sugar. Loads 150 units every 3 days.     isosorbide dinitrate (ISORDIL) 20 MG tablet Take 1 tablet (20 mg total) by mouth 3 (three) times daily. 90 tablet 0   LORazepam (ATIVAN) 0.5 MG tablet Take 1 tablet (0.5 mg total) by mouth every 8 (eight) hours as needed for anxiety (Refractory nausea and vomiting). (Patient taking differently: Take 0.5 mg by mouth in the morning, at noon, in the evening, and at bedtime.) 30 tablet 0   metoprolol succinate (TOPROL-XL) 50 MG 24 hr tablet Take 1 tablet (50 mg total) by mouth daily. Take with or immediately following a meal. (Patient taking differently: Take 50 mg by mouth daily.) 90 tablet 3   nitroGLYCERIN (NITROSTAT) 0.4 MG SL tablet Place 1 tablet (0.4 mg total) under the tongue every 5 (five) minutes as  needed for chest pain. (Patient taking differently: Place 0.4 mg under the tongue every 5 (five) minutes as needed for chest pain (max 3 doses. call 911).) 90 tablet 3   polyethylene glycol (MIRALAX / GLYCOLAX) 17 g packet Take 17 g by mouth daily as needed for moderate constipation.     pregabalin (LYRICA) 50 MG capsule Take 50 mg by mouth as needed (  neuropathy).     promethazine (PHENERGAN) 25 MG suppository Unwrap and place 1 suppository (25 mg total) rectally every 6 (six) hours as needed for nausea or vomiting. 30 each 2   rosuvastatin (CRESTOR) 20 MG tablet Take 20 mg by mouth daily.     scopolamine (TRANSDERM-SCOP) 1 MG/3DAYS Place 1 patch (1.5 mg total) onto the skin every 3 (three) days. 24 patch 4   pantoprazole (PROTONIX) 40 MG tablet Take 1 tablet (40 mg total) by mouth 2 (two) times daily before a meal. (Patient taking differently: Take 40 mg by mouth 2 (two) times daily as needed (heartburn).) 60 tablet 1   prochlorperazine (COMPAZINE) 10 MG tablet Take 1 tablet (10 mg total) by mouth every 6 (six) hours as needed for up to 7 days for nausea or vomiting. 28 tablet 0   No current facility-administered medications for this visit.    REVIEW OF SYSTEMS:  [X]  denotes positive finding, [ ]  denotes negative finding Cardiac  Comments:  Chest pain or chest pressure:    Shortness of breath upon exertion:    Short of breath when lying flat:    Irregular heart rhythm:        Vascular    Pain in calf, thigh, or hip brought on by ambulation:    Pain in feet at night that wakes you up from your sleep:     Blood clot in your veins:    Leg swelling:         Pulmonary    Oxygen at home:    Productive cough:     Wheezing:         Neurologic    Sudden weakness in arms or legs:     Sudden numbness in arms or legs:     Sudden onset of difficulty speaking or slurred speech:    Temporary loss of vision in one eye:     Problems with dizziness:         Gastrointestinal    Blood in stool:      Vomited blood:  x       Genitourinary    Burning when urinating:     Blood in urine:        Psychiatric    Major depression:         Hematologic    Bleeding problems:    Problems with blood clotting too easily:        Skin    Rashes or ulcers:        Constitutional    Fever or chills:     PHYSICAL EXAM:   Vitals:   11/14/22 0933  BP: (!) 145/92  Pulse: (!) 101  Resp: 20  Temp: 98 F (36.7 C)  SpO2: 98%  Weight: 171 lb (77.6 kg)  Height: 6' (1.829 m)    GENERAL: The patient is a well-nourished female, in no acute distress. The vital signs are documented above. CARDIAC: There is a regular rate and rhythm.  VASCULAR: She has palpable radial pulses bilaterally. She has no significant lower extremity swelling. PULMONARY: There is good air exchange bilaterally without wheezing or rales. MUSCULOSKELETAL: There are no major deformities or cyanosis. NEUROLOGIC: No focal weakness or paresthesias are detected. SKIN: There are no ulcers or rashes noted. PSYCHIATRIC: The patient has a normal affect.  DATA:    UPPER EXTREMITY VEIN MAP: I have independently interpreted her upper extremity vein map.  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  The  upper arm cephalic vein is very small with diameters ranging from 2.1-2.7 mm.  The basilic vein is small and not adequate.  On the left side the diameters of the cephalic vein ranged from 1 mm to 2 mm.  The patient has a thrombosed brachial basilic fistula on the left.  ARTERIAL DOPPLER STUDY: I have independently interpreted her arterial duplex study.  On the right side the brachial artery measures 4.5 mm in diameter.  There is a triphasic radial waveform and a biphasic ulnar waveform.  On the left side there is a triphasic radial and ulnar waveform.  The brachial artery measures 3.7 mm in diameter.   Waverly Ferrari [facility] [phone]

## 2022-11-15 DIAGNOSIS — Z992 Dependence on renal dialysis: Secondary | ICD-10-CM | POA: Diagnosis not present

## 2022-11-15 DIAGNOSIS — N186 End stage renal disease: Secondary | ICD-10-CM | POA: Diagnosis not present

## 2022-11-17 NOTE — Progress Notes (Signed)
Device clinic programing orders sent from staff message to device clinic.

## 2022-11-18 ENCOUNTER — Encounter (HOSPITAL_COMMUNITY): Payer: Self-pay | Admitting: Vascular Surgery

## 2022-11-18 ENCOUNTER — Encounter: Payer: Self-pay | Admitting: Cardiovascular Disease

## 2022-11-18 ENCOUNTER — Other Ambulatory Visit: Payer: Self-pay

## 2022-11-18 DIAGNOSIS — L299 Pruritus, unspecified: Secondary | ICD-10-CM | POA: Diagnosis not present

## 2022-11-18 DIAGNOSIS — Z23 Encounter for immunization: Secondary | ICD-10-CM | POA: Diagnosis not present

## 2022-11-18 DIAGNOSIS — Z992 Dependence on renal dialysis: Secondary | ICD-10-CM | POA: Diagnosis not present

## 2022-11-18 DIAGNOSIS — N186 End stage renal disease: Secondary | ICD-10-CM | POA: Diagnosis not present

## 2022-11-18 NOTE — Anesthesia Preprocedure Evaluation (Addendum)
Anesthesia Evaluation  Patient identified by MRN, date of birth, ID band Patient awake    Reviewed: Allergy & Precautions, H&P , NPO status , Patient's Chart, lab work & pertinent test results, reviewed documented beta blocker date and time   History of Anesthesia Complications (+) PONV and history of anesthetic complications  Airway Mallampati: II  TM Distance: >3 FB Neck ROM: Full    Dental no notable dental hx. (+) Teeth Intact, Dental Advisory Given   Pulmonary asthma    Pulmonary exam normal breath sounds clear to auscultation       Cardiovascular Exercise Tolerance: Good hypertension, Pt. on medications and Pt. on home beta blockers + Past MI and +CHF  + Cardiac Defibrillator  Rhythm:Regular Rate:Normal     Neuro/Psych  Headaches  Anxiety Depression       GI/Hepatic Neg liver ROS,GERD  Medicated,,  Endo/Other  diabetes    Renal/GU ESRF and DialysisRenal disease  negative genitourinary   Musculoskeletal  (+) Arthritis , Osteoarthritis,    Abdominal   Peds  Hematology  (+) Blood dyscrasia, anemia   Anesthesia Other Findings   Reproductive/Obstetrics negative OB ROS                             Anesthesia Physical Anesthesia Plan  ASA: 3  Anesthesia Plan: General   Post-op Pain Management: Tylenol PO (pre-op)*   Induction: Intravenous, Rapid sequence and Cricoid pressure planned  PONV Risk Score and Plan: 3 and Ondansetron, Propofol infusion, Midazolam, Scopolamine patch - Pre-op and TIVA  Airway Management Planned: Oral ETT  Additional Equipment:   Intra-op Plan:   Post-operative Plan: Extubation in OR  Informed Consent: I have reviewed the patients History and Physical, chart, labs and discussed the procedure including the risks, benefits and alternatives for the proposed anesthesia with the patient or authorized representative who has indicated his/her understanding  and acceptance.     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  Coronary CTA 01/2020 showed coronary calcium score of 0 and no evidence of CAD.  Patient has frequent admissions for intractable nausea and vomiting secondary to diabetic gastroparesis.  She was last seen by cardiologist Dr. Raynelle Jan 10/09/2022 during one of these admissions.  She was also initiated on hemodialysis at that time.  Cardiology recommended she continue GDMT with dialysis now assisting in volume management.  Most recent admission 4/1 to 11/05/2022 for intractable nausea and vomiting secondary to gastroparesis and missed dialysis.  Patient currently dialyzing Monday Wednesday Friday through right IJ TDC.  She had a first stage basilic vein transposition on 10/01/2022 which was subsequently clotted.  RUE graft placement was recommended.  History of type 1 diabetes, last A1c 7.5 on 10/05/2022.  Patient will need day of surgery labs and evaluation.  EKG 11/04/2022: Sinus tachycardia.  Rate 122.  Probable LAE.  Nonspecific IVCD. Borderline repolarization abnormality. Artifact in lead(s) I II aVR. No significant change since last tracing  Perioperative prescription for implanted cardiac device programming 11/18/2022: Device Information:  Clinic EP Physician:  Dr. York Pellant   Device Type:  Defibrillator Manufacturer and Phone #:  Medtronic:  [phone] Pacemaker Dependent?:  No. Date of Last Device Check:  3/1/2024Normal Device Function?:  Yes.    Electrophysiologist's Recommendations:  ? Have magnet  available. ? Provide continuous ECG monitoring when magnet is used or reprogramming is to be performed.  ? Procedure may interfere with device function.  Magnet should be placed over device during procedure.  TTE 10/05/2022: 1. Left ventricular ejection fraction, by estimation, is 40 to 45%. The  left ventricle has mildly decreased function. The left ventricle  demonstrates global hypokinesis. There is mild concentric left ventricular  hypertrophy. Left ventricular diastolic  parameters are consistent with Grade III diastolic dysfunction  (restrictive). Elevated left atrial pressure.  2. Right ventricular systolic function is mildly reduced. The right  ventricular size is moderately enlarged. There is moderately elevated  pulmonary artery systolic pressure.  3. Left atrial size was moderately dilated.  4. Right atrial size was moderately dilated.  5. The mitral valve is normal in structure. No evidence of mitral valve  regurgitation. No evidence of mitral stenosis.  6. Tricuspid valve regurgitation is moderate to severe.  7. The aortic valve is tricuspid. Aortic valve regurgitation is not  visualized. No aortic stenosis is present.  8. The inferior vena cava is dilated in size with <50% respiratory  variability, suggesting right atrial pressure of 15 mmHg.   Comparison(s): Prior images reviewed side by side. The left ventricular  function is significantly worse. The right ventricular systolic function  is worse. TR severity and PA hypertension are also worse, with evidence of  hypervolemia.   Coronary CT 01/19/2020: IMPRESSION: 1. Coronary calcium score of 0.  2. Normal coronary origin with right dominance.  3. Normal coronary arteries.  4. Severe biatrial enlargement.  Lexiscan/modified Bruce Sestamibi stress test 12/20/2019: No previous exam available for comparison. Lexiscan/modified Bruce nuclear stress test performed using 1-day protocol. Stress EKG is  non-diagnostic, as this is pharmacological stress test. In addition, stress EKG at 71% [facility] showed sinus tachycardia, nonspecific ST-T flattening in inferolateral leads.  Normal myocardial perfusion. Mild global reduction in myocardial thickening. Stress LVEF 42%. High risk study due to low LVEF. Recommend clinical correlation.   )        Anesthesia Quick Evaluation

## 2022-11-18 NOTE — Progress Notes (Signed)
PERIOPERATIVE PRESCRIPTION FOR IMPLANTED CARDIAC DEVICE PROGRAMMING  Patient Information: Name:  [name]  DOB:  March 05, 1971  MRN:  [id number]    Planned Procedure:  Right Arm Arteriovenous gortex Graft  Surgeon:  Dr. Edilia Bo  Date of Procedure:  11/19/2022  Cautery will be used.  Position during surgery:  supine   Please send documentation back to:  [facility] (Fax # [phone])   Device Information:  Clinic EP Physician:  Dr. York Pellant   Device Type:  Defibrillator Manufacturer and Phone #:  Medtronic: [phone] Pacemaker Dependent?:  No. Date of Last Device Check:  10/04/2022 Normal Device Function?:  Yes.    Electrophysiologist's Recommendations:  Have magnet available. Provide continuous ECG monitoring when magnet is used or reprogramming is to be performed.  Procedure may interfere with device function.  Magnet should be placed over device during procedure.  Per Device Clinic Standing Orders, Lenor Coffin, RN  8:32 AM 11/18/2022

## 2022-11-18 NOTE — Progress Notes (Signed)
PCP - Delma Officer, PA Cardiologist - Mahesh A. Chandrasekhar  PPM/ICD -  Defibrillator  Device Orders - Sent Rep Notified - Tobie Lords e-mailed  Chest x-ray - 11/04/22 EKG - 11/04/22 ECHO - 10/05/22  Fasting Blood Sugar - 112 Wears continuous blood glucose monitor on left arm  Omnipod-Pt to call prescribing dr to get instructions  Aspirin Instructions: Take DOS  Anesthesia review: Y  Patient verbally denies any shortness of breath, fever, cough and chest pain during phone call   -------------  SDW INSTRUCTIONS given:  Your procedure is scheduled on 11/19/22.  Report to [facility] Main Entrance "A" at 0530 A.M., and check in at the Admitting office.  Call this number if you have problems the morning of surgery:  [phone]   Remember:  Do not eat or drink after midnight the night before your surgery    Take these medicines the morning of surgery with A SIP OF WATER  acetaminophen (TYLENOL)  aspirin  hydrALAZINE (APRESOLINE)  LORazepam (ATIVAN)  metoCLOPramide (REGLAN)  metoprolol succinate (TOPROL-XL) pregabalin (LYRICA)-if needed albuterol (PROAIR HFA)-if needed (Please bring on the day of surgery) hydrOXYzine (ATARAX)-if needed pantoprazole (PROTONIX)-if needed scopolamine (TRANSDERM-SCOP)-if needed  As of today, STOP taking any Aspirin (unless otherwise instructed by your surgeon) Aleve, Naproxen, Ibuprofen, Motrin, Advil, Goody's, BC's, all herbal medications, fish oil, and all vitamins.  ** PLEASE check your blood sugar the morning of your surgery when you wake up and every 2 hours until you get to the Short Stay unit.  If your blood sugar is less than 70 mg/dL, you will need to treat for low blood sugar: Do not take insulin. Treat a low blood sugar (less than 70 mg/dL) with  cup of clear juice (cranberry or apple), 4 glucose tablets, OR glucose gel. Recheck blood sugar in 15 minutes after treatment (to make sure it is greater than 70 mg/dL). If  your blood sugar is not greater than 70 mg/dL on recheck, call [phone] for further instructions.                              Do not wear jewelry, make up, or nail polish            Do not wear lotions, powders, perfumes/colognes, or deodorant.            Do not shave 48 hours prior to surgery.  Men may shave face and neck.            Do not bring valuables to the hospital.            [facility] is not responsible for any belongings or valuables.  Do NOT Smoke (Tobacco/Vaping) 24 hours prior to your procedure If you use a CPAP at night, you may bring all equipment for your overnight stay.   Contacts, glasses, dentures or bridgework may not be worn into surgery.      For patients admitted to the hospital, discharge time will be determined by your treatment team.   Patients discharged the day of surgery will not be allowed to drive home, and someone needs to stay with them for 24 hours.    Special instructions:   [place]- Preparing For Surgery  Before surgery, you can play an important role. Because skin is not sterile, your skin needs to be as free of germs as possible. You can reduce the number of germs on your skin by washing with  CHG (chlorahexidine gluconate) Soap before surgery.  CHG is an antiseptic cleaner which kills germs and bonds with the skin to continue killing germs even after washing.    Oral Hygiene is also important to reduce your risk of infection.  Remember - BRUSH YOUR TEETH THE MORNING OF SURGERY WITH YOUR REGULAR TOOTHPASTE  Please do not use if you have an allergy to CHG or antibacterial soaps. If your skin becomes reddened/irritated stop using the CHG.  Do not shave (including legs and underarms) for at least 48 hours prior to first CHG shower. It is OK to shave your face.  Please follow these instructions carefully.   Shower the NIGHT BEFORE SURGERY and the MORNING OF SURGERY with DIAL Soap.   Pat yourself dry with a CLEAN TOWEL.  Wear CLEAN  PAJAMAS to bed the night before surgery  Place CLEAN SHEETS on your bed the night of your first shower and DO NOT SLEEP WITH PETS.   Day of Surgery: Please shower morning of surgery  Wear Clean/Comfortable clothing the morning of surgery Do not apply any deodorants/lotions.   Remember to brush your teeth WITH YOUR REGULAR TOOTHPASTE.   Questions were answered. Patient verbalized understanding of instructions.

## 2022-11-18 NOTE — Progress Notes (Signed)
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  Coronary CTA 01/2020 showed coronary calcium score of 0 and no evidence of CAD.  Patient has frequent admissions for intractable nausea and vomiting secondary to diabetic gastroparesis.  She was last seen by cardiologist Dr. Raynelle Jan 10/09/2022 during one of these admissions.  She was also initiated on hemodialysis at that time.  Cardiology recommended she continue GDMT with dialysis now assisting in volume management.  Most recent admission 4/1 to 11/05/2022 for intractable nausea and vomiting secondary to gastroparesis and missed dialysis.  Patient currently dialyzing Monday Wednesday Friday through right IJ TDC.  She had a first stage basilic vein transposition on 10/01/2022 which was subsequently clotted.  RUE graft placement was recommended.  History of type 1 diabetes, last A1c 7.5 on 10/05/2022.  Patient will need day of surgery labs and evaluation.  EKG 11/04/2022: Sinus tachycardia.  Rate 122.  Probable LAE.  Nonspecific IVCD. Borderline repolarization abnormality. Artifact in lead(s) I II aVR. No significant change since last tracing  Perioperative prescription for implanted cardiac device programming 11/18/2022: Device Information:   Clinic EP Physician:  Dr. York Pellant    Device Type:  Defibrillator Manufacturer and Phone #:  [employer]: [phone] Pacemaker Dependent?:  No. Date of Last Device Check:  10/04/2022         Normal Device Function?:  Yes.     Electrophysiologist's Recommendations:   Have magnet available. Provide continuous ECG monitoring when magnet is used or reprogramming is to be performed.   Procedure may interfere with device function.  Magnet should be placed over device during procedure.  TTE 10/05/2022: 1. Left ventricular ejection fraction, by estimation, is 40 to 45%. The  left ventricle has mildly decreased function. The left ventricle  demonstrates global hypokinesis. There is mild concentric left ventricular  hypertrophy. Left ventricular diastolic  parameters are consistent with Grade III diastolic dysfunction  (restrictive). Elevated left atrial pressure.   2. Right ventricular systolic function is mildly reduced. The right  ventricular size is moderately enlarged. There is moderately elevated  pulmonary artery systolic pressure.   3. Left atrial size was moderately dilated.   4. Right atrial size was moderately dilated.   5. The mitral valve is normal in structure. No evidence of mitral valve  regurgitation. No evidence of mitral stenosis.   6. Tricuspid valve regurgitation is moderate to severe.   7. The aortic valve is tricuspid. Aortic valve regurgitation is not  visualized. No aortic stenosis is present.   8. The inferior vena cava is dilated in size with <50% respiratory  variability, suggesting right atrial pressure of 15 mmHg.   Comparison(s): Prior images reviewed side by side. The left ventricular  function is significantly worse. The right ventricular systolic function  is worse. TR severity and PA hypertension are also worse, with evidence of  hypervolemia.   Coronary CT 01/19/2020: IMPRESSION: 1. Coronary calcium score of 0.   2. Normal coronary origin with right dominance.   3. Normal coronary arteries.   4. Severe biatrial enlargement.  Lexiscan/modified Bruce Sestamibi stress test 12/20/2019: No previous exam available for comparison. Lexiscan/modified Bruce nuclear stress test performed using 1-day protocol. Stress EKG is non-diagnostic, as this is pharmacological stress test. In addition,  stress EKG at 71% MPHR showed sinus tachycardia,  nonspecific ST-T flattening in inferolateral leads.  Normal myocardial perfusion. Mild global reduction in myocardial thickening. Stress LVEF 42%. High risk study due to low LVEF. Recommend clinical correlation.      Zannie Cove [facility]/Anesthesiology Phone [phone] 11/18/2022 11:36 AM

## 2022-11-19 ENCOUNTER — Other Ambulatory Visit: Payer: Self-pay

## 2022-11-19 ENCOUNTER — Encounter (HOSPITAL_COMMUNITY)
Admission: RE | Disposition: A | Discharge status: Home / Self Care | Payer: Self-pay | Source: Home / Self Care | Attending: Vascular Surgery

## 2022-11-19 ENCOUNTER — Ambulatory Visit (HOSPITAL_COMMUNITY): Payer: BC Managed Care – PPO | Admitting: Physician Assistant

## 2022-11-19 ENCOUNTER — Encounter (HOSPITAL_COMMUNITY): Payer: Self-pay | Admitting: Vascular Surgery

## 2022-11-19 ENCOUNTER — Ambulatory Visit (HOSPITAL_COMMUNITY)
Admission: RE | Admit: 2022-11-19 | Discharge: 2022-11-19 | Disposition: A | Discharge status: Home / Self Care | Payer: BC Managed Care – PPO | Attending: Vascular Surgery | Admitting: Vascular Surgery

## 2022-11-19 DIAGNOSIS — I132 Hypertensive heart and chronic kidney disease with heart failure and with stage 5 chronic kidney disease, or end stage renal disease: Secondary | ICD-10-CM | POA: Insufficient documentation

## 2022-11-19 DIAGNOSIS — I5032 Chronic diastolic (congestive) heart failure: Secondary | ICD-10-CM | POA: Insufficient documentation

## 2022-11-19 DIAGNOSIS — N185 Chronic kidney disease, stage 5: Secondary | ICD-10-CM

## 2022-11-19 DIAGNOSIS — Z833 Family history of diabetes mellitus: Secondary | ICD-10-CM | POA: Insufficient documentation

## 2022-11-19 DIAGNOSIS — Z992 Dependence on renal dialysis: Secondary | ICD-10-CM | POA: Diagnosis not present

## 2022-11-19 DIAGNOSIS — I509 Heart failure, unspecified: Secondary | ICD-10-CM | POA: Diagnosis not present

## 2022-11-19 DIAGNOSIS — J45909 Unspecified asthma, uncomplicated: Secondary | ICD-10-CM | POA: Insufficient documentation

## 2022-11-19 DIAGNOSIS — Z8249 Family history of ischemic heart disease and other diseases of the circulatory system: Secondary | ICD-10-CM | POA: Diagnosis not present

## 2022-11-19 DIAGNOSIS — Z794 Long term (current) use of insulin: Secondary | ICD-10-CM | POA: Insufficient documentation

## 2022-11-19 DIAGNOSIS — I252 Old myocardial infarction: Secondary | ICD-10-CM | POA: Insufficient documentation

## 2022-11-19 DIAGNOSIS — E1122 Type 2 diabetes mellitus with diabetic chronic kidney disease: Secondary | ICD-10-CM | POA: Diagnosis not present

## 2022-11-19 DIAGNOSIS — N186 End stage renal disease: Secondary | ICD-10-CM

## 2022-11-19 DIAGNOSIS — E1022 Type 1 diabetes mellitus with diabetic chronic kidney disease: Secondary | ICD-10-CM | POA: Insufficient documentation

## 2022-11-19 HISTORY — DX: Presence of automatic (implantable) cardiac defibrillator: Z95.810

## 2022-11-19 HISTORY — PX: AV FISTULA PLACEMENT: SHX1204

## 2022-11-19 LAB — POCT I-STAT, CHEM 8
BUN: 23 mg/dL — ABNORMAL HIGH (ref 6–20)
Calcium, Ion: 1.13 mmol/L — ABNORMAL LOW (ref 1.15–1.40)
Chloride: 101 mmol/L (ref 98–111)
Creatinine, Ser: 3.6 mg/dL — ABNORMAL HIGH (ref 0.44–1.00)
Glucose, Bld: 238 mg/dL — ABNORMAL HIGH (ref 70–99)
HCT: 37 % (ref 36.0–46.0)
Hemoglobin: 12.6 g/dL (ref 12.0–15.0)
Potassium: 4 mmol/L (ref 3.5–5.1)
Sodium: 137 mmol/L (ref 135–145)
TCO2: 27 mmol/L (ref 22–32)

## 2022-11-19 LAB — GLUCOSE, CAPILLARY
Glucose-Capillary: 262 mg/dL — ABNORMAL HIGH (ref 70–99)
Glucose-Capillary: 146 mg/dL — ABNORMAL HIGH (ref 70–99)

## 2022-11-19 SURGERY — INSERTION OF ARTERIOVENOUS (AV) GORE-TEX GRAFT ARM
Anesthesia: General | Site: Arm Lower | Laterality: Right

## 2022-11-19 MED ORDER — METOPROLOL SUCCINATE ER 25 MG PO TB24
ORAL_TABLET | ORAL | Status: AC
  Administered 2022-11-19: 50 mg via ORAL
  Filled 2022-11-19: qty 2

## 2022-11-19 MED ORDER — ACETAMINOPHEN 500 MG PO TABS: 1000.0000 mg | ORAL_TABLET | Freq: Once | ORAL | Status: AC

## 2022-11-19 MED ORDER — PHENYLEPHRINE 80 MCG/ML (10ML) SYRINGE FOR IV PUSH (FOR BLOOD PRESSURE SUPPORT)
PREFILLED_SYRINGE | INTRAVENOUS | Status: AC
  Filled 2022-11-19: qty 10

## 2022-11-19 MED ORDER — LIDOCAINE HCL (PF) 1 % IJ SOLN
INTRAMUSCULAR | Status: AC
  Filled 2022-11-19: qty 30

## 2022-11-19 MED ORDER — PROPOFOL 500 MG/50ML IV EMUL
INTRAVENOUS | Status: DC | PRN
  Administered 2022-11-19: 150 ug/kg/min via INTRAVENOUS
  Administered 2022-11-19: 175 ug/kg/min via INTRAVENOUS

## 2022-11-19 MED ORDER — CHLORHEXIDINE GLUCONATE 4 % EX LIQD
60.0000 mL | Freq: Once | CUTANEOUS | Status: DC
  Filled 2022-11-19: qty 60

## 2022-11-19 MED ORDER — FENTANYL CITRATE (PF) 250 MCG/5ML IJ SOLN
INTRAMUSCULAR | Status: DC | PRN
  Administered 2022-11-19 (×2): 50 ug via INTRAVENOUS

## 2022-11-19 MED ORDER — PAPAVERINE HCL 30 MG/ML IJ SOLN
INTRAMUSCULAR | Status: DC | PRN
  Administered 2022-11-19: 60 mg via INTRAVENOUS

## 2022-11-19 MED ORDER — HEPARIN 6000 UNIT IRRIGATION SOLUTION
Status: DC | PRN
  Administered 2022-11-19: 1

## 2022-11-19 MED ORDER — FENTANYL CITRATE (PF) 250 MCG/5ML IJ SOLN
INTRAMUSCULAR | Status: AC
  Filled 2022-11-19: qty 5

## 2022-11-19 MED ORDER — CIPROFLOXACIN IN D5W 400 MG/200ML IV SOLN
INTRAVENOUS | Status: AC
  Filled 2022-11-19: qty 200

## 2022-11-19 MED ORDER — PROPOFOL 10 MG/ML IV BOLUS
INTRAVENOUS | Status: AC
  Filled 2022-11-19: qty 20

## 2022-11-19 MED ORDER — ROCURONIUM BROMIDE 10 MG/ML (PF) SYRINGE
PREFILLED_SYRINGE | INTRAVENOUS | Status: AC
  Filled 2022-11-19: qty 10

## 2022-11-19 MED ORDER — LIDOCAINE-EPINEPHRINE (PF) 1 %-1:200000 IJ SOLN
INTRAMUSCULAR | Status: AC
  Filled 2022-11-19: qty 30

## 2022-11-19 MED ORDER — PROTAMINE SULFATE 10 MG/ML IV SOLN
INTRAVENOUS | Status: DC | PRN
  Administered 2022-11-19: 30 mg via INTRAVENOUS
  Administered 2022-11-19: 10 mg via INTRAVENOUS

## 2022-11-19 MED ORDER — HEPARIN SODIUM (PORCINE) 1000 UNIT/ML IJ SOLN
INTRAMUSCULAR | Status: AC
  Filled 2022-11-19: qty 20

## 2022-11-19 MED ORDER — DEXAMETHASONE SODIUM PHOSPHATE 10 MG/ML IJ SOLN
INTRAMUSCULAR | Status: DC | PRN
  Administered 2022-11-19: 10 mg via INTRAVENOUS

## 2022-11-19 MED ORDER — METOPROLOL SUCCINATE ER 25 MG PO TB24: 50.0000 mg | ORAL_TABLET | Freq: Once | ORAL | Status: AC

## 2022-11-19 MED ORDER — PROPOFOL 10 MG/ML IV BOLUS
INTRAVENOUS | Status: DC | PRN
  Administered 2022-11-19: 120 mg via INTRAVENOUS

## 2022-11-19 MED ORDER — LIDOCAINE 2% (20 MG/ML) 5 ML SYRINGE
INTRAMUSCULAR | Status: DC | PRN
  Administered 2022-11-19: 60 mg via INTRAVENOUS

## 2022-11-19 MED ORDER — CHLORHEXIDINE GLUCONATE 0.12 % MT SOLN
OROMUCOSAL | Status: AC
  Administered 2022-11-19: 15 mL via OROMUCOSAL
  Filled 2022-11-19: qty 15

## 2022-11-19 MED ORDER — LIDOCAINE HCL (PF) 1 % IJ SOLN
INTRAMUSCULAR | Status: DC | PRN
  Administered 2022-11-19: 30 mL

## 2022-11-19 MED ORDER — LIDOCAINE-EPINEPHRINE (PF) 1 %-1:200000 IJ SOLN
INTRAMUSCULAR | Status: DC | PRN
  Administered 2022-11-19: 30 mL

## 2022-11-19 MED ORDER — ACETAMINOPHEN 500 MG PO TABS
ORAL_TABLET | ORAL | Status: AC
  Administered 2022-11-19: 1000 mg via ORAL
  Filled 2022-11-19: qty 2

## 2022-11-19 MED ORDER — FENTANYL CITRATE (PF) 100 MCG/2ML IJ SOLN: 25.0000 ug | INTRAMUSCULAR | Status: DC | PRN

## 2022-11-19 MED ORDER — SODIUM CHLORIDE 0.9 % IV SOLN: INTRAVENOUS | Status: DC

## 2022-11-19 MED ORDER — SCOPOLAMINE 1 MG/3DAYS TD PT72
MEDICATED_PATCH | TRANSDERMAL | Status: AC
  Administered 2022-11-19: 1.5 mg via TRANSDERMAL
  Filled 2022-11-19: qty 1

## 2022-11-19 MED ORDER — HEPARIN SODIUM (PORCINE) 1000 UNIT/ML IJ SOLN
INTRAMUSCULAR | Status: DC | PRN
  Administered 2022-11-19: 7000 [IU] via INTRAVENOUS

## 2022-11-19 MED ORDER — DEXAMETHASONE SODIUM PHOSPHATE 10 MG/ML IJ SOLN
INTRAMUSCULAR | Status: AC
  Filled 2022-11-19: qty 1

## 2022-11-19 MED ORDER — PHENYLEPHRINE 80 MCG/ML (10ML) SYRINGE FOR IV PUSH (FOR BLOOD PRESSURE SUPPORT)
PREFILLED_SYRINGE | INTRAVENOUS | Status: DC | PRN
  Administered 2022-11-19 (×4): 160 ug via INTRAVENOUS

## 2022-11-19 MED ORDER — PAPAVERINE HCL 30 MG/ML IJ SOLN
INTRAMUSCULAR | Status: AC
  Filled 2022-11-19: qty 2

## 2022-11-19 MED ORDER — 0.9 % SODIUM CHLORIDE (POUR BTL) OPTIME
TOPICAL | Status: DC | PRN
  Administered 2022-11-19: 1000 mL

## 2022-11-19 MED ORDER — SCOPOLAMINE 1 MG/3DAYS TD PT72: 1.0000 | MEDICATED_PATCH | Freq: Once | TRANSDERMAL | Status: DC

## 2022-11-19 MED ORDER — PROTAMINE SULFATE 10 MG/ML IV SOLN
INTRAVENOUS | Status: AC
  Filled 2022-11-19: qty 5

## 2022-11-19 MED ORDER — PROPOFOL 1000 MG/100ML IV EMUL
INTRAVENOUS | Status: AC
  Filled 2022-11-19: qty 100

## 2022-11-19 MED ORDER — LIDOCAINE 2% (20 MG/ML) 5 ML SYRINGE
INTRAMUSCULAR | Status: AC
  Filled 2022-11-19: qty 5

## 2022-11-19 MED ORDER — OXYCODONE-ACETAMINOPHEN 5-325 MG PO TABS: 1.0000 | ORAL_TABLET | ORAL | 0 refills | Status: DC | PRN

## 2022-11-19 MED ORDER — CHLORHEXIDINE GLUCONATE 0.12 % MT SOLN: 15.0000 mL | Freq: Once | OROMUCOSAL | Status: AC

## 2022-11-19 MED ORDER — HEPARIN 6000 UNIT IRRIGATION SOLUTION
Status: AC
  Filled 2022-11-19: qty 500

## 2022-11-19 MED ORDER — CIPROFLOXACIN IN D5W 400 MG/200ML IV SOLN
400.0000 mg | INTRAVENOUS | Status: AC
  Administered 2022-11-19: 400 mg via INTRAVENOUS

## 2022-11-19 MED ORDER — SUCCINYLCHOLINE CHLORIDE 200 MG/10ML IV SOSY
PREFILLED_SYRINGE | INTRAVENOUS | Status: AC
  Filled 2022-11-19: qty 10

## 2022-11-19 MED ORDER — ORAL CARE MOUTH RINSE: 15.0000 mL | Freq: Once | OROMUCOSAL | Status: AC

## 2022-11-19 MED ORDER — ONDANSETRON HCL 4 MG/2ML IJ SOLN
INTRAMUSCULAR | Status: AC
  Filled 2022-11-19: qty 2

## 2022-11-19 SURGICAL SUPPLY — 39 items
ADH SKN CLS APL DERMABOND .7 (GAUZE/BANDAGES/DRESSINGS) ×1
ARMBAND PINK RESTRICT EXTREMIT (MISCELLANEOUS) ×4 IMPLANT
BAG COUNTER SPONGE SURGICOUNT (BAG) ×2 IMPLANT
BAG SPNG CNTER NS LX DISP (BAG) ×1
CANISTER SUCT 3000ML PPV (MISCELLANEOUS) ×2 IMPLANT
CANNULA VESSEL 3MM 2 BLNT TIP (CANNULA) ×2 IMPLANT
CLIP TI MEDIUM 6 (CLIP) ×2 IMPLANT
CLIP TI WIDE RED SMALL 6 (CLIP) ×2 IMPLANT
DERMABOND ADVANCED .7 DNX12 (GAUZE/BANDAGES/DRESSINGS) ×2 IMPLANT
ELECT REM PT RETURN 9FT ADLT (ELECTROSURGICAL) ×1
ELECTRODE REM PT RTRN 9FT ADLT (ELECTROSURGICAL) ×2 IMPLANT
GLOVE BIO SURGEON STRL SZ7.5 (GLOVE) ×2 IMPLANT
GLOVE BIOGEL PI IND STRL 8 (GLOVE) ×2 IMPLANT
GLOVE SURG POLY ORTHO LF SZ7.5 (GLOVE) IMPLANT
GLOVE SURG UNDER LTX SZ8 (GLOVE) ×2 IMPLANT
GOWN STRL REUS W/ TWL LRG LVL3 (GOWN DISPOSABLE) ×6 IMPLANT
GOWN STRL REUS W/TWL LRG LVL3 (GOWN DISPOSABLE) ×3
GRAFT GORETEX STRT 4-7X45 (Vascular Products) IMPLANT
KIT BASIN OR (CUSTOM PROCEDURE TRAY) ×2 IMPLANT
KIT TURNOVER KIT B (KITS) ×2 IMPLANT
NDL 18GX1X1/2 (RX/OR ONLY) (NEEDLE) IMPLANT
NEEDLE 18GX1X1/2 (RX/OR ONLY) (NEEDLE) ×1 IMPLANT
NS IRRIG 1000ML POUR BTL (IV SOLUTION) ×2 IMPLANT
PACK CV ACCESS (CUSTOM PROCEDURE TRAY) ×2 IMPLANT
PAD ARMBOARD 7.5X6 YLW CONV (MISCELLANEOUS) ×4 IMPLANT
SLING ARM FOAM STRAP LRG (SOFTGOODS) IMPLANT
SLING ARM FOAM STRAP MED (SOFTGOODS) IMPLANT
SPIKE FLUID TRANSFER (MISCELLANEOUS) ×2 IMPLANT
SPONGE SURGIFOAM ABS GEL 100 (HEMOSTASIS) IMPLANT
SUT MNCRL AB 4-0 PS2 18 (SUTURE) ×4 IMPLANT
SUT PROLENE 5 0 C 1 24 (SUTURE) IMPLANT
SUT PROLENE 6 0 BV (SUTURE) ×4 IMPLANT
SUT VIC AB 3-0 SH 27 (SUTURE) ×2
SUT VIC AB 3-0 SH 27X BRD (SUTURE) ×4 IMPLANT
SYR 20CC LL (SYRINGE) IMPLANT
SYR TOOMEY 50ML (SYRINGE) IMPLANT
TOWEL GREEN STERILE (TOWEL DISPOSABLE) ×2 IMPLANT
UNDERPAD 30X36 HEAVY ABSORB (UNDERPADS AND DIAPERS) ×2 IMPLANT
WATER STERILE IRR 1000ML POUR (IV SOLUTION) ×2 IMPLANT

## 2022-11-19 NOTE — Anesthesia Procedure Notes (Signed)
Procedure Name: Intubation Date/Time: 11/19/2022 7:41 AM  Performed by: Alvera Novel, CRNAPre-anesthesia Checklist: Patient identified, Emergency Drugs available, Suction available and Patient being monitored Patient Re-evaluated:Patient Re-evaluated prior to induction Oxygen Delivery Method: Circle System Utilized Preoxygenation: Pre-oxygenation with 100% oxygen Induction Type: IV induction, Rapid sequence and Cricoid Pressure applied Ventilation: Mask ventilation without difficulty Laryngoscope Size: Mac and 3 Grade View: Grade II Tube type: Oral Tube size: 7.0 mm Number of attempts: 1 Airway Equipment and Method: Stylet Placement Confirmation: ETT inserted through vocal cords under direct vision, positive ETCO2 and breath sounds checked- equal and bilateral Secured at: 22 cm Tube secured with: Tape Dental Injury: Teeth and Oropharynx as per pre-operative assessment

## 2022-11-19 NOTE — Transfer of Care (Signed)
Immediate Anesthesia Transfer of Care Note  Patient: [name]  Procedure(s) Performed: INSERTION OF RIGHT ARM ARTERIOVENOUS (AV) GORE-TEX GRAFT (Right: Arm Lower)  Patient Location: PACU  Anesthesia Type:General  Level of Consciousness: drowsy  Airway & Oxygen Therapy: Patient Spontanous Breathing and Patient connected to face mask oxygen  Post-op Assessment: Report given to RN and Post -op Vital signs reviewed and stable  Post vital signs: Reviewed and stable  Last Vitals:  Vitals Value Taken Time  BP 108/65 11/19/22 0930  Temp    Pulse 87 11/19/22 0933  Resp 11 11/19/22 0933  SpO2 100 % 11/19/22 0933  Vitals shown include unvalidated device data.  Last Pain:  Vitals:   11/19/22 0640  PainSc: 0-No pain         Complications: No notable events documented.

## 2022-11-19 NOTE — Interval H&P Note (Signed)
History and Physical Interval Note:  11/19/2022 7:10 AM  [name]  has presented today for surgery, with the diagnosis of ESRD.  The various methods of treatment have been discussed with the patient and family. After consideration of risks, benefits and other options for treatment, the patient has consented to  Procedure(s): INSERTION OF RIGHT ARM ARTERIOVENOUS (AV) GORE-TEX GRAFT (Right) as a surgical intervention.  The patient's history has been reviewed, patient examined, no change in status, stable for surgery.  I have reviewed the patient's chart and labs.  Questions were answered to the patient's satisfaction.     [name]

## 2022-11-19 NOTE — Anesthesia Postprocedure Evaluation (Signed)
Anesthesia Post Note  Patient: Sherlonda Gallman  Procedure(s) Performed: INSERTION OF RIGHT ARM ARTERIOVENOUS (AV) GORE-TEX GRAFT (Right: Arm Lower)     Patient location during evaluation: PACU Anesthesia Type: General Level of consciousness: awake and alert Pain management: pain level controlled Vital Signs Assessment: post-procedure vital signs reviewed and stable Respiratory status: spontaneous breathing, nonlabored ventilation and respiratory function stable Cardiovascular status: blood pressure returned to baseline and stable Postop Assessment: no apparent nausea or vomiting Anesthetic complications: no  No notable events documented.  Last Vitals:  Vitals:   11/19/22 1015 11/19/22 1030  BP: 106/68 109/71  Pulse: 85 86  Resp: (!) 28 18  Temp:  36.7 C  SpO2: 95% 97%    Last Pain:  Vitals:   11/19/22 0640  PainSc: 0-No pain                 Harrol Novello,W. EDMOND

## 2022-11-19 NOTE — Op Note (Signed)
    NAME: [name]    MRN: [id number] DOB: 10/15/1970    DATE OF OPERATION: 11/19/2022  PREOP DIAGNOSIS:    End-stage renal disease  POSTOP DIAGNOSIS:    Same  PROCEDURE:    Right forearm AV graft (4-7 mm PTFE graft)  SURGEON: Di Kindle. Edilia Bo, MD  ASSIST: Graceann Congress, PA  ANESTHESIA: General   EBL: Minimal  INDICATIONS:    [name] is a 52 y.o. female who presents for new access.  She has a defibrillator on the left side.  She did not have adequate veins for fistula in the right arm.  I placed a right forearm graft.  FINDINGS:   I placed the right forearm graft.  The arterial aspect of the graft is along the radial aspect of the forearm.  I used a 4-7 mm tapered graft to lower her risk of steal  TECHNIQUE:   The patient was taken to the operating room and received a general anesthetic.  The right arm was prepped and draped in usual sterile fashion.  I did look at the veins myself with the SonoSite and they did not appear to be adequate for fistula.  A longitudinal incision was made just above the antecubital level.  Here the brachial vein was dissected free and was a reasonable size vein.  The brachial artery was dissected free beneath the fascia.  Using 1 distal counterincision a 4-7 mm PTFE graft was tunneled in a loop fashion in the forearm with the arterial aspect of the graft along the lateral aspect of the forearm.  The patient was heparinized.  The brachial artery was clamped proximally and distally and a longitudinal arteriotomy was made.  A short segment of the 4 mm end of the graft was excised, the graft slightly spatulated and sewn into side to the artery using a continuous 6-0 Prolene suture.  The graft then pulled the appropriate length for anastomosis to the brachial vein.  The vein was ligated distally.  It was spatulated proximally.  The graft is cut to the appropriate length spatulated and sewn into into the vein using 2 continuous 6-0 Prolene  sutures.  At the completion there was an excellent thrill in the graft.  There was a radial and ulnar signal with the Doppler.  Hemostasis was obtained in the wounds.  The heparin was partially reversed with protamine.  The wounds were closed with a deep layer of 3-0 Vicryl and the skin closed with 4-0 Monocryl.  Dermabond was applied.  The patient tolerated the procedure well and was transferred to the recovery room in stable condition.  All needle and sponge counts were correct.  Given the complexity of the case,  the assistant was necessary in order to expedient the procedure and safely perform the technical aspects of the operation.  The assistant provided traction and countertraction to assist with exposure of the artery and vein.  They also assisted with suture ligation of multiple venous branches. They also assisted with tunneling of the graft.  They played a critical role for both anastomoses.. These skills, especially following the Prolene suture for the anastomosis, could not have been adequately performed by a scrub tech assistant.    Waverly Ferrari, MD, FACS [facility]  DATE OF DICTATION:   11/19/2022

## 2022-11-20 ENCOUNTER — Encounter (HOSPITAL_COMMUNITY): Payer: Self-pay | Admitting: Vascular Surgery

## 2022-11-20 DIAGNOSIS — N186 End stage renal disease: Secondary | ICD-10-CM | POA: Diagnosis not present

## 2022-11-20 DIAGNOSIS — L299 Pruritus, unspecified: Secondary | ICD-10-CM | POA: Diagnosis not present

## 2022-11-20 DIAGNOSIS — Z992 Dependence on renal dialysis: Secondary | ICD-10-CM | POA: Diagnosis not present

## 2022-11-20 DIAGNOSIS — Z23 Encounter for immunization: Secondary | ICD-10-CM | POA: Diagnosis not present

## 2022-11-22 DIAGNOSIS — Z23 Encounter for immunization: Secondary | ICD-10-CM | POA: Diagnosis not present

## 2022-11-22 DIAGNOSIS — Z992 Dependence on renal dialysis: Secondary | ICD-10-CM | POA: Diagnosis not present

## 2022-11-22 DIAGNOSIS — L299 Pruritus, unspecified: Secondary | ICD-10-CM | POA: Diagnosis not present

## 2022-11-22 DIAGNOSIS — N186 End stage renal disease: Secondary | ICD-10-CM | POA: Diagnosis not present

## 2022-11-25 DIAGNOSIS — L299 Pruritus, unspecified: Secondary | ICD-10-CM | POA: Diagnosis not present

## 2022-11-25 DIAGNOSIS — N186 End stage renal disease: Secondary | ICD-10-CM | POA: Diagnosis not present

## 2022-11-25 DIAGNOSIS — Z992 Dependence on renal dialysis: Secondary | ICD-10-CM | POA: Diagnosis not present

## 2022-11-27 DIAGNOSIS — L299 Pruritus, unspecified: Secondary | ICD-10-CM | POA: Diagnosis not present

## 2022-11-27 DIAGNOSIS — Z992 Dependence on renal dialysis: Secondary | ICD-10-CM | POA: Diagnosis not present

## 2022-11-27 DIAGNOSIS — N186 End stage renal disease: Secondary | ICD-10-CM | POA: Diagnosis not present

## 2022-11-29 DIAGNOSIS — N186 End stage renal disease: Secondary | ICD-10-CM | POA: Diagnosis not present

## 2022-11-29 DIAGNOSIS — L299 Pruritus, unspecified: Secondary | ICD-10-CM | POA: Diagnosis not present

## 2022-11-29 DIAGNOSIS — Z992 Dependence on renal dialysis: Secondary | ICD-10-CM | POA: Diagnosis not present

## 2022-12-02 DIAGNOSIS — N186 End stage renal disease: Secondary | ICD-10-CM | POA: Diagnosis not present

## 2022-12-02 DIAGNOSIS — Z992 Dependence on renal dialysis: Secondary | ICD-10-CM | POA: Diagnosis not present

## 2022-12-03 DIAGNOSIS — N186 End stage renal disease: Secondary | ICD-10-CM | POA: Diagnosis not present

## 2022-12-03 DIAGNOSIS — I129 Hypertensive chronic kidney disease with stage 1 through stage 4 chronic kidney disease, or unspecified chronic kidney disease: Secondary | ICD-10-CM | POA: Diagnosis not present

## 2022-12-03 DIAGNOSIS — Z992 Dependence on renal dialysis: Secondary | ICD-10-CM | POA: Diagnosis not present

## 2022-12-04 DIAGNOSIS — N186 End stage renal disease: Secondary | ICD-10-CM | POA: Diagnosis not present

## 2022-12-04 DIAGNOSIS — Z992 Dependence on renal dialysis: Secondary | ICD-10-CM | POA: Diagnosis not present

## 2022-12-05 ENCOUNTER — Ambulatory Visit (INDEPENDENT_AMBULATORY_CARE_PROVIDER_SITE_OTHER): Payer: BC Managed Care – PPO | Admitting: Physician Assistant

## 2022-12-05 ENCOUNTER — Ambulatory Visit: Payer: BC Managed Care – PPO | Attending: Internal Medicine

## 2022-12-05 VITALS — BP 147/93 | HR 101 | Temp 98.0°F | Resp 20 | Ht 72.0 in | Wt 173.0 lb

## 2022-12-05 DIAGNOSIS — E782 Mixed hyperlipidemia: Secondary | ICD-10-CM | POA: Diagnosis not present

## 2022-12-05 DIAGNOSIS — N186 End stage renal disease: Secondary | ICD-10-CM

## 2022-12-05 DIAGNOSIS — Z992 Dependence on renal dialysis: Secondary | ICD-10-CM

## 2022-12-05 LAB — LIPID PANEL
Chol/HDL Ratio: 4.2 ratio (ref 0.0–4.4)
Cholesterol, Total: 262 mg/dL — ABNORMAL HIGH (ref 100–199)
HDL: 63 mg/dL (ref >39)
LDL Chol Calc (NIH): 183 mg/dL — ABNORMAL HIGH (ref 0–99)
Triglycerides: 94 mg/dL (ref 0–149)
VLDL Cholesterol Cal: 16 mg/dL (ref 5–40)

## 2022-12-05 LAB — ALT: ALT: 6 IU/L (ref 0–32)

## 2022-12-05 NOTE — Progress Notes (Signed)
POST OPERATIVE OFFICE NOTE    CC:  F/u for surgery  HPI:  [name] is a 52 y.o. female who is s/p creation of right forearm AV loop graft on 11/19/2022 by Dr. Edilia Bo.  She has a history of first stage left brachiobasilic fistula creation in February 2024, however this thrombosed early.   Pt returns today for follow up.  Pt states she has been doing fine since surgery.  Her right arm incisions have healed well without signs of infection.  She denies any right hand numbness, weakness, or excessive coldness.  She is currently dialyzing via TDC.   Allergies  Allergen Reactions   Atorvastatin Other (See Comments)    Dizziness    Dulaglutide Nausea And Vomiting and Other (See Comments)    Caused pancreatitis    Metoclopramide Other (See Comments)    Possible movement disorder or tardive dyskinesia while on reglan Long term   Other Itching    Peas and carrots   Sertraline Nausea And Vomiting   Vancomycin Itching and Swelling   Zofran [Ondansetron] Nausea And Vomiting and Other (See Comments)    Causes nausea vomiting to worsen / doesn't really work for patient.   Amoxicillin Itching and Rash   Lantus [Insulin Glargine] Itching and Rash    Has tolerated insulin glargine many times with no reported issues   Miconazole Nitrate Nausea And Vomiting and Rash    Current Outpatient Medications  Medication Sig Dispense Refill   acetaminophen (TYLENOL) 500 MG tablet Take 1,000 mg by mouth every 8 (eight) hours as needed for moderate pain or mild pain.     albuterol (PROAIR HFA) 108 (90 Base) MCG/ACT inhaler Inhale 2 puffs into the lungs every 6 (six) hours as needed for wheezing or shortness of breath. 6.7 g 1   aspirin EC 81 MG tablet Take 1 tablet (81 mg total) by mouth daily. Swallow whole. 30 tablet 12   hydrOXYzine (ATARAX) 50 MG tablet Take 50 mg by mouth 3 (three) times daily as needed for anxiety.     insulin aspart (NOVOLOG) 100 UNIT/ML injection Inject 120 Units into the skin  See admin instructions. Patient uses this with the Omnipod insulin pump  its vary according to blood sugar. Loads 120 units every 3 days.     LORazepam (ATIVAN) 0.5 MG tablet Take 1 tablet (0.5 mg total) by mouth every 8 (eight) hours as needed for anxiety (Refractory nausea and vomiting). (Patient taking differently: Take 0.5 mg by mouth every 6 (six) hours as needed for anxiety.) 30 tablet 0   metoCLOPramide (REGLAN) 5 MG tablet Take 5 mg by mouth 3 (three) times daily before meals.     metoprolol succinate (TOPROL-XL) 50 MG 24 hr tablet Take 1 tablet (50 mg total) by mouth daily. Take with or immediately following a meal. (Patient taking differently: Take 50 mg by mouth daily.) 90 tablet 3   oxyCODONE-acetaminophen (PERCOCET) 5-325 MG tablet Take 1 tablet by mouth every 4 (four) hours as needed for severe pain. 20 tablet 0   polyethylene glycol (MIRALAX / GLYCOLAX) 17 g packet Take 17 g by mouth daily as needed for moderate constipation.     pregabalin (LYRICA) 50 MG capsule Take 50 mg by mouth daily as needed (neuropathy).     promethazine (PHENERGAN) 25 MG suppository Unwrap and place 1 suppository (25 mg total) rectally every 6 (six) hours as needed for nausea or vomiting. 30 each 2   scopolamine (TRANSDERM-SCOP) 1 MG/3DAYS Place 1 patch (1.5  mg total) onto the skin every 3 (three) days. (Patient taking differently: Place 1 patch onto the skin as needed (gastroparesis).) 24 patch 4   amLODipine (NORVASC) 10 MG tablet Take 1 tablet (10 mg total) by mouth daily. (Patient not taking: Reported on 11/18/2022) 30 tablet 0   hydrALAZINE (APRESOLINE) 100 MG tablet Take 1 tablet (100 mg total) by mouth every 8 (eight) hours. (Patient taking differently: Take 50 mg by mouth 3 (three) times daily.) 90 tablet 0   isosorbide dinitrate (ISORDIL) 20 MG tablet Take 1 tablet (20 mg total) by mouth 3 (three) times daily. 90 tablet 0   nitroGLYCERIN (NITROSTAT) 0.4 MG SL tablet Place 1 tablet (0.4 mg total) under the  tongue every 5 (five) minutes as needed for chest pain. (Patient taking differently: Place 0.4 mg under the tongue every 5 (five) minutes as needed for chest pain (max 3 doses. call 911).) 90 tablet 3   pantoprazole (PROTONIX) 40 MG tablet Take 1 tablet (40 mg total) by mouth 2 (two) times daily before a meal. (Patient taking differently: Take 40 mg by mouth 2 (two) times daily as needed (heartburn).) 60 tablet 1   prochlorperazine (COMPAZINE) 10 MG tablet Take 1 tablet (10 mg total) by mouth every 6 (six) hours as needed for up to 7 days for nausea or vomiting. 28 tablet 0   No current facility-administered medications for this visit.     ROS:  See HPI  Physical Exam:   Incision: Right forearm incisions well-healed without drainage or erythema Extremities: Palpable right radial pulse.  Right forearm AV loop graft with palpable thrill and audible bruit Neuro: Intact motor and sensation of right upper extremity    Assessment/Plan:  This is a 52 y.o. female who is s/p: Creation of right forearm AV loop graft on 11/19/2022   -The patient has done well since surgery.  Her right arm incisions have healed without signs of infection -She denies any symptoms of steal such as right hand weakness, numbness, excessive pain or coldness.  She has a palpable right radial pulse -Her right forearm AV loop graft has a palpable thrill and audible bruit on exam -I believe that her right arm AV graft should be ready for use by 12/31/2022.  I explained to the patient if her graft functions well without any issues for at least 2-3 dialysis sessions, her [facility] can be removed. -She can follow-up with our office as needed   Loel Dubonnet, PA-C [facility] [phone]   Call MD: Karin Lieu

## 2022-12-06 DIAGNOSIS — Z992 Dependence on renal dialysis: Secondary | ICD-10-CM | POA: Diagnosis not present

## 2022-12-06 DIAGNOSIS — N186 End stage renal disease: Secondary | ICD-10-CM | POA: Diagnosis not present

## 2022-12-09 ENCOUNTER — Telehealth: Payer: Self-pay

## 2022-12-09 DIAGNOSIS — Z992 Dependence on renal dialysis: Secondary | ICD-10-CM | POA: Diagnosis not present

## 2022-12-09 DIAGNOSIS — E782 Mixed hyperlipidemia: Secondary | ICD-10-CM

## 2022-12-09 DIAGNOSIS — E7801 Familial hypercholesterolemia: Secondary | ICD-10-CM

## 2022-12-09 DIAGNOSIS — N186 End stage renal disease: Secondary | ICD-10-CM | POA: Diagnosis not present

## 2022-12-09 MED ORDER — EZETIMIBE 10 MG PO TABS: 10.0000 mg | ORAL_TABLET | Freq: Every day | ORAL | 3 refills | Status: DC

## 2022-12-09 MED ORDER — PRAVASTATIN SODIUM 40 MG PO TABS: 40.0000 mg | ORAL_TABLET | Freq: Every evening | ORAL | 3 refills | Status: DC

## 2022-12-09 NOTE — Telephone Encounter (Signed)
-----   Message from Christell Constant, MD sent at 12/08/2022  3:45 PM EDT ----- Results: Elevated LDL in the setting of PAD Plan: She is off pravastatin; Offer her return to pravastatin ( she has prior atorvastatin allergy) and zetia 10 mg; labs in three months  Christell Constant, MD

## 2022-12-09 NOTE — Progress Notes (Unsigned)
Cardiology Office Note:    Date:  12/10/2022   ID:  [name], DOB 02-04-1971, MRN [id number]  PCP:  Delma Officer, PA   [facility] Providers Cardiologist:  Christell Constant, MD Electrophysiologist:  Maurice Small, MD     Referring MD: Delma Officer, PA   CC:  BP f/u   History of Present Illness:    [name] is a 52 y.o. 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  Has CKD and is pending renal transplant discussions. 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   2024: Planning to start HD.  Blood pressure have improved   Patient notes that she is doing so so. She is struggling with the complexities.   She will be getting gastroparesis. No longer having chest pain BP drops during dialysis.   There are no interval hospital/ED visit.    No chest pain or pressure .  No SOB/DOE and no PND/Orthopnea.  No weight gain or leg swelling.     Past Medical History:  Diagnosis Date   AICD (automatic cardioverter/defibrillator) present    [employer]tronic   Anemia    Anxiety    Arthritis    Asthma    CHF (congestive heart failure) (HCC)    Chronic diastolic CHF (congestive heart failure) (HCC) 12/14/2019   Depression    Diabetic ulcer of left foot (HCC) 05/12/2013   ESRD on hemodialysis (HCC)    MWF at [facility]   Gastroparesis    Generalized abdominal pain    History of chicken pox    Loss of weight 12/02/2019   Migraines    Mixed hyperlipidemia 09/05/2022   Mood disorder (HCC)    anxiety   Myocardial infarction (HCC)    Prurigo nodularis    with diabetic dermopathy   Type 1 diabetes mellitus (HCC) 05/23/2022   Type 1 diabetes, uncontrolled, with neuropathy    [place]   Ulcers of both lower legs (HCC) 02/20/2014    Past Surgical History:  Procedure Laterality Date   AV FISTULA PLACEMENT Left 10/01/2022   Procedure: ARTERIOVENOUS (AV)FISTULA  CREATION;  Surgeon: Larina Earthly, MD;  Location: [facility];  Service: Vascular;  Laterality: Left;   AV FISTULA PLACEMENT Right 11/19/2022   Procedure: INSERTION OF RIGHT ARM ARTERIOVENOUS (AV) GORE-TEX GRAFT;  Surgeon: Chuck Hint, MD;  Location: [facility] OR;  Service: Vascular;  Laterality: Right;   BIOPSY  08/11/2020   Procedure: BIOPSY;  Surgeon: Meryl Dare, MD;  Location: [facility] ENDOSCOPY;  Service: Endoscopy;;   BIOPSY  04/02/2022   Procedure: BIOPSY;  Surgeon: Lynann Bologna, MD;  Location: [facility] ENDOSCOPY;  Service: Gastroenterology;;   ESOPHAGOGASTRODUODENOSCOPY (EGD) WITH PROPOFOL N/A 08/11/2020   Procedure: ESOPHAGOGASTRODUODENOSCOPY (EGD) WITH PROPOFOL;  Surgeon: Meryl Dare, MD;  Location: [facility]OS[facility];  Service: Endoscopy;  Laterality: N/A;   ESOPHAGOGASTRODUODENOSCOPY (EGD) WITH PROPOFOL N/A 04/02/2022   Procedure: ESOPHAGOGASTRODUODENOSCOPY (EGD) WITH PROPOFOL;  Surgeon: Lynann Bologna, MD;  Location: [facility];  Service: Gastroenterology;  Laterality: N/A;   ESOPHAGOGASTRODUODENOSCOPY (EGD) WITH PROPOFOL N/A 05/23/2022   Procedure: ESOPHAGOGASTRODUODENOSCOPY (EGD) WITH PROPOFOL;  Surgeon: Midge Minium, MD;  Location: [facility] ENDOSCOPY;  Service: Endoscopy;  Laterality: N/A;   ICD IMPLANT N/A 04/09/2022   Procedure: ICD IMPLANT;  Surgeon: [name], MD;  Location: [facility] INVASIVE CV LAB;  Service: Cardiovascular;  Laterality: N/A;   IR FLUORO GUIDE CV LINE RIGHT  10/07/2022   IR US GUIDE VASC ACCESS RIGHT  10/07/2022   treadmill stress test  01/2013   WNL, low risk study    Current Medications: Current Meds  Medication Sig   acetaminophen (TYLENOL) 500 MG tablet Take 1,000 mg by mouth every 8 (eight) hours as needed for moderate pain or mild pain.   albuterol (PROAIR HFA) 108 (90 Base) MCG/ACT inhaler Inhale 2 puffs into the lungs every 6 (six) hours as needed for wheezing or shortness of breath.   aspirin EC 81 MG tablet Take 1 tablet (81 mg total) by mouth daily. Swallow  whole.   ezetimibe (ZETIA) 10 MG tablet Take 1 tablet (10 mg total) by mouth daily.   FIASP 100 UNIT/ML SOLN 120 Units daily at 6 (six) AM.   hydrALAZINE (APRESOLINE) 50 MG tablet Take 1.5 tablets (75 mg total) by mouth 3 (three) times daily.   hydrOXYzine (ATARAX) 50 MG tablet Take 50 mg by mouth 3 (three) times daily as needed for anxiety.   insulin aspart (NOVOLOG) 100 UNIT/ML injection Inject 120 Units into the skin See admin instructions. Patient uses this with the Omnipod insulin pump  its vary according to blood sugar. Loads 120 units every 3 days.   LORazepam (ATIVAN) 0.5 MG tablet Take 1 tablet (0.5 mg total) by mouth every 8 (eight) hours as needed for anxiety (Refractory nausea and vomiting).   Methoxy PEG-Epoetin Beta (MIRCERA IJ) as directed. [facility]   metoCLOPramide (REGLAN) 5 MG tablet Take 5 mg by mouth 3 (three) times daily before meals.   nitroGLYCERIN (NITROSTAT) 0.4 MG SL tablet Place 1 tablet (0.4 mg total) under the tongue every 5 (five) minutes as needed for chest pain.   pantoprazole (PROTONIX) 40 MG tablet Take 1 tablet (40 mg total) by mouth 2 (two) times daily before a meal. (Patient taking differently: Take 40 mg by mouth 2 (two) times daily as needed (heartburn).)   polyethylene glycol (MIRALAX / GLYCOLAX) 17 g packet Take 17 g by mouth daily as needed for moderate constipation.   pravastatin (PRAVACHOL) 40 MG tablet Take 1 tablet (40 mg total) by mouth every evening.   pregabalin (LYRICA) 50 MG capsule Take 50 mg by mouth daily as needed (neuropathy).   prochlorperazine (COMPAZINE) 10 MG tablet Take 1 tablet (10 mg total) by mouth every 6 (six) hours as needed for up to 7 days for nausea or vomiting.   promethazine (PHENERGAN) 25 MG suppository Unwrap and place 1 suppository (25 mg total) rectally every 6 (six) hours as needed for nausea or vomiting.   scopolamine (TRANSDERM-SCOP) 1 MG/3DAYS Place 1 patch (1.5 mg total) onto the skin every 3 (three) days.    torsemide (DEMADEX) 20 MG tablet as needed (fluid).   [DISCONTINUED] hydrALAZINE (APRESOLINE) 100 MG tablet Take 1 tablet (100 mg total) by mouth every 8 (eight) hours.   [DISCONTINUED] isosorbide dinitrate (ISORDIL) 20 MG tablet Take 1 tablet (20 mg total) by mouth 3 (three) times daily.   [DISCONTINUED] metoprolol succinate (TOPROL-XL) 50 MG 24 hr tablet Take 1 tablet (50 mg total) by mouth daily. Take with or immediately following a meal.     Allergies:   Atorvastatin, Dulaglutide, Metoclopramide, Other, Sertraline, Vancomycin, Zofran [ondansetron], Amoxicillin, Lantus [insulin glargine], and Miconazole nitrate   Social History   Socioeconomic History   Marital status: Divorced    Spouse name: Not on file   Number of children: 2   Years of education: Not on file   Highest education level:  Not on file  Occupational History   Occupation: Production designer, theatre/television/film  Tobacco Use   Smoking status: Never   Smokeless tobacco: Never  Vaping Use   Vaping Use: Never used  Substance and Sexual Activity   Alcohol use: Not Currently   Drug use: No   Sexual activity: Yes    Partners: Male    Birth control/protection: None  Other Topics Concern   Not on file  Social History Narrative   Caffeine use: daily   Occupation: cleans houses   Regular exercise: yes, active 5x/wk   Diet: good water, fruits/vegetables daily   Social Determinants of Health   Financial Resource Strain: Low Risk  (07/15/2022)   Overall Financial Resource Strain (CARDIA)    Difficulty of Paying Living Expenses: Not very hard  Food Insecurity: No Food Insecurity (11/04/2022)   Hunger Vital Sign    Worried About Running Out of Food in the Last Year: Never true    Ran Out of Food in the Last Year: Never true  Transportation Needs: No Transportation Needs (11/04/2022)   PRAPARE - Administrator, Civil Service (Medical): No    Lack of Transportation (Non-Medical): No  Physical Activity: Not on file  Stress: Not on file   Social Connections: Not on file     Family History: The patient's family history includes Bipolar disorder in her mother; CAD in her maternal aunt; Calcium disorder in her mother; Cancer in her maternal grandmother, maternal uncle, and paternal grandmother; Cancer (age of onset: 42) in her maternal aunt; Diabetes in her mother, paternal grandfather, paternal grandmother, and sister; Heart disease in her mother; Heart failure in her sister; Hypertension in her mother; Stroke in her maternal aunt; Thyroid disease in her mother.  ROS:   Please see the history of present illness.     All other systems reviewed and are negative.  EKGs/Labs/Other Studies Reviewed:    The following studies were reviewed today:  EKG:   09/01/2022: SR with diffuse TWI   Cardiac Studies & Procedures       ECHOCARDIOGRAM  ECHOCARDIOGRAM COMPLETE 10/05/2022  Narrative ECHOCARDIOGRAM REPORT    Patient Name:   JAQUETA KASAI Date of Exam: 10/05/2022 Medical Rec #:  [id number]      Height:       72.0 in Accession #:    [id number]     Weight:       179.5 lb Date of Birth:  04/17/1971     BSA:          2.035 m Patient Age:    51 years       BP:           120/72 mmHg Patient Gender: F              HR:           94 bpm. Exam Location:  Inpatient  Procedure: 2D Echo, Cardiac Doppler and Color Doppler  Indications:    Chest Pain R07.9  History:        Patient has no prior history of Echocardiogram examinations. CHF, CAD and Previous Myocardial Infarction; Risk Factors:Diabetes and Hypertension.  Sonographer:    Louie Boston RDCS Referring Phys: [id number] [facility] SIRA  IMPRESSIONS   1. Left ventricular ejection fraction, by estimation, is 40 to 45%. The left ventricle has mildly decreased function. The left ventricle demonstrates global hypokinesis. There is mild concentric left ventricular hypertrophy. Left ventricular diastolic parameters are consistent with Grade III diastolic dysfunction (restrictive).  Elevated left atrial pressure. 2. Right ventricular systolic function is mildly reduced. The right ventricular size is moderately enlarged. There is moderately elevated pulmonary artery systolic pressure. 3. Left atrial size was moderately dilated. 4. Right atrial size was moderately dilated. 5. The mitral valve is normal in structure. No evidence of mitral valve regurgitation. No evidence of mitral stenosis. 6. Tricuspid valve regurgitation is moderate to severe. 7. The aortic valve is tricuspid. Aortic valve regurgitation is not visualized. No aortic stenosis is present. 8. The inferior vena cava is dilated in size with <50% respiratory variability, suggesting right atrial pressure of 15 mmHg.  Comparison(s): Prior images reviewed side by side. The left ventricular function is significantly worse. The right ventricular systolic function is worse. TR severity and PA hypertension are also worse, with evidence of hypervolemia.  FINDINGS Left Ventricle: Left ventricular ejection fraction, by estimation, is 40 to 45%. The left ventricle has mildly decreased function. The left ventricle demonstrates global hypokinesis. The left ventricular internal cavity size was normal in size. There is mild concentric left ventricular hypertrophy. Left ventricular diastolic parameters are consistent with Grade III diastolic dysfunction (restrictive). Elevated left atrial pressure.  Right Ventricle: The right ventricular size is moderately enlarged. No increase in right ventricular wall thickness. Right ventricular systolic function is mildly reduced. There is moderately elevated pulmonary artery systolic pressure. The tricuspid regurgitant velocity is 3.05 m/s, and with an assumed right atrial pressure of 15 mmHg, the estimated right ventricular systolic pressure is 52.2 mmHg.  Left Atrium: Left atrial size was moderately dilated.  Right Atrium: Right atrial size was moderately dilated.  Pericardium: Trivial  pericardial effusion is present.  Mitral Valve: The mitral valve is normal in structure. There is mild thickening of the mitral valve leaflet(s). Mild mitral annular calcification. No evidence of mitral valve regurgitation. No evidence of mitral valve stenosis.  Tricuspid Valve: The tricuspid valve is normal in structure. Tricuspid valve regurgitation is moderate to severe.  Aortic Valve: The aortic valve is tricuspid. Aortic valve regurgitation is not visualized. No aortic stenosis is present.  Pulmonic Valve: The pulmonic valve was normal in structure. Pulmonic valve regurgitation is trivial.  Aorta: The aortic root and ascending aorta are structurally normal, with no evidence of dilitation.  Venous: The inferior vena cava is dilated in size with less than 50% respiratory variability, suggesting right atrial pressure of 15 mmHg.  IAS/Shunts: No atrial level shunt detected by color flow Doppler.  Additional Comments: A device lead is visualized in the right ventricle. There is pleural effusion in the left lateral region.   LEFT VENTRICLE PLAX 2D LVIDd:         4.70 cm     Diastology LVIDs:         3.80 cm     LV e' medial:    6.53 cm/s LV PW:         1.30 cm     LV E/e' medial:  16.1 LV IVS:        1.14 cm     LV e' lateral:   8.05 cm/s LVOT diam:     2.00 cm     LV E/e' lateral: 13.0 LV SV:         43 LV SV Index:   21 LVOT Area:     3.14 cm  LV Volumes (MOD) LV vol d, MOD A2C: 85.9 ml LV vol d, MOD A4C: 78.0 ml LV vol s, MOD A2C: 54.3 ml LV vol s, MOD A4C: 53.8  ml LV SV MOD A2C:     31.6 ml LV SV MOD A4C:     78.0 ml LV SV MOD BP:      31.4 ml  RIGHT VENTRICLE            IVC RV S prime:     9.90 cm/s  IVC diam: 2.40 cm TAPSE (M-mode): 1.7 cm  LEFT ATRIUM           Index        RIGHT ATRIUM           Index LA diam:      4.67 cm 2.30 cm/m   RA Area:     19.40 cm LA Vol (A2C): 82.5 ml 40.55 ml/m  RA Volume:   58.20 ml  28.60 ml/m LA Vol (A4C): 66.5 ml 32.68  ml/m AORTIC VALVE LVOT Vmax:   77.50 cm/s LVOT Vmean:  58.000 cm/s LVOT VTI:    0.138 m  AORTA Ao Root diam: 3.40 cm Ao Asc diam:  3.30 cm Ao Desc diam: 2.40 cm  MITRAL VALVE                TRICUSPID VALVE MV Area (PHT): 6.96 cm     TR Peak grad:   37.2 mmHg MV Decel Time: 109 msec     TR Vmax:        305.00 cm/s MV E velocity: 105.00 cm/s MV A velocity: 41.60 cm/s   SHUNTS MV E/A ratio:  2.52         Systemic VTI:  0.14 m Systemic Diam: 2.00 cm  Rachelle Hora Croitoru MD Electronically signed by Thurmon Fair MD Signature Date/Time: 10/05/2022/2:53:24 PM    Final     CT SCANS  CT CORONARY MORPH W/CTA COR W/SCORE 01/19/2020  Addendum 01/19/2020  3:10 PM ADDENDUM REPORT: 01/19/2020 15:08  EXAM: OVER-READ INTERPRETATION  CT CHEST  The following report is an over-read performed by radiologist Dr. Deberah Pelton [facility] on 01/19/2020. This over-read does not include interpretation of cardiac or coronary anatomy or pathology. The coronary calcium score/coronary CTA interpretation by the cardiologist is attached.  COMPARISON:  None.  FINDINGS: The visualized portions of the lower lung fields show no suspicious nodules, masses, or infiltrates. Small bilateral pleural effusions are seen.  The visualized portions of the mediastinum and chest wall are unremarkable.  IMPRESSION: Small bilateral pleural effusions. No other significant non-cardiovascular abnormality seen in visualized portion of the thorax.   Electronically Signed By: Danae Orleans M.D. On: 01/19/2020 15:08  Narrative CLINICAL DATA:  Shortness of breath/Normal stress test/Rule out balanced ischemia  EXAM: Cardiac/Coronary CTA  TECHNIQUE: The patient was scanned on a [employer]. A 100 kV retrospective scan was triggered in the descending thoracic aorta at 111 HU's. Axial non-contrast 3 mm slices were carried out through the heart. The data set was analyzed on a dedicated  work station and scored using the Agatson method. Gantry rotation speed was 250 msecs and collimation was .6 mm. 5 mg IV diltiazem and 0.8 mg of sl NTG was given. The 3D data set was reconstructed in 5% intervals of the 35-75 % of the R-R cycle. Diastolic phases were analyzed on a dedicated work station using MPR, MIP and VRT modes. The patient received 80 cc of contrast.  FINDINGS: Image quality: excellent.  Noise artifact is: Limited.  Coronary Arteries:  Normal coronary origin.  Right dominance.  Left main: The left main is a large caliber vessel with a  normal take off from the left coronary cusp that bifurcates to form a left anterior descending artery and a left circumflex artery. There is no plaque or stenosis.  Left anterior descending artery: The LAD is patent without evidence of plaque or stenosis. The LAD gives off 1 large patent diagonal branch.  Left circumflex artery: The LCX is non-dominant and patent with no evidence of plaque or stenosis. The LCX gives off 1 patent obtuse marginal branch.  Right coronary artery: The RCA is dominant with normal take off from the right coronary cusp. There is no evidence of plaque or stenosis. The RCA terminates as a PDA and right posterolateral branch without evidence of plaque or stenosis.  Right Atrium: Right atrial size is severely dilated.  Right Ventricle: The right ventricular cavity is within normal limits.  Left Atrium: Left atrial size is severely dilated with no left atrial appendage filling defect.  Left Ventricle: The ventricular cavity size is within normal limits. There are no stigmata of prior infarction. There is no abnormal filling defect.  Pulmonary arteries: Normal size without proximal filling defect.  Pulmonary veins: Normal pulmonary venous drainage.  Pericardium: Normal thickness with no significant effusion or calcium present.  Cardiac valves: The aortic valve is trileaflet without  significant calcification. The mitral valve is normal structure without significant calcification.  Aorta: Normal caliber with no significant disease.  Extra-cardiac findings: See attached radiology report for non-cardiac structures.  IMPRESSION: 1. Coronary calcium score of 0.  2. Normal coronary origin with right dominance.  3. Normal coronary arteries.  4. Severe biatrial enlargement.  Lennie Odor, MD  Electronically Signed: By: Lennie Odor On: 01/19/2020 12:56            Recent Labs: 10/09/2022: B Natriuretic Peptide 1,719.6 10/21/2022: Magnesium 1.6; TSH 1.587 11/05/2022: Platelets 178 11/19/2022: BUN 23; Creatinine, Ser 3.60; Hemoglobin 12.6; Potassium 4.0; Sodium 137 12/05/2022: ALT 6  Recent Lipid Panel    Component Value Date/Time   CHOL 262 (H) 12/05/2022 0848   TRIG 94 12/05/2022 0848   HDL 63 12/05/2022 0848   CHOLHDL 4.2 12/05/2022 0848   CHOLHDL 5.2 10/12/2019 0834   VLDL 16 10/12/2019 0834   LDLCALC 183 (H) 12/05/2022 0848   LDLDIRECT 104.1 04/12/2014 1608     Risk Assessment/Calculations:             Physical Exam:    VS:  BP 138/60   Pulse (!) 117   Ht 6' (1.829 m)   Wt 172 lb (78 kg)   LMP 11/17/2021 (Approximate)   SpO2 99%   BMI 23.33 kg/m     Wt Readings from Last 3 Encounters:  12/10/22 172 lb (78 kg)  12/05/22 173 lb (78.5 kg)  11/19/22 170 lb (77.1 kg)    GEN: NAD HEENT: Normal CARDIAC: RRR, no murmurs, rubs, gallops RESPIRATORY:  Clear to auscultation without rales, wheezing or rhonchi  ABDOMEN: Soft, non-tender, non-distended MUSCULOSKELETAL:  No edema; No deformity  SKIN: Warm and dry NEUROLOGIC:  Alert and oriented x 3 PSYCHIATRIC:  Sad affect   ASSESSMENT:    1. Essential hypertension   2. Precordial pain   3. Coronary artery disease involving native coronary artery of native heart without angina pectoris   4. NSVT (nonsustained ventricular tachycardia) (HCC)   5. 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)     PLAN:    Type I DM and gastroparesis Prior NSTEMI- rare (will get stress test)  - no epicardial disease (CAC 0)  on 2021 testing - Was planning PET MPI if she had PD; I worry a bit false positive from PET MPI and HD; if we pursue coronary microvascular disease testing may pursue LHC  - no ASA given GI bleed history - no nitroglycerin needed  HLD with T1 DM - statin myopathy (atorvastatin and rosuvastatin) - will return pravastatin and zetia  HF recovered EF - NYHA I Stage C complicated by SCD s/p ICD  - sees EP  ESRD HTN Reflex Tachycardia - volume management through HD - stop isosorbide dinitrate and decrease hydralazine to 75 mg  - will  succinate  Six months with me or APP    Medication Adjustments/Labs and Tests Ordered: Current medicines are reviewed at length with the patient today.  Concerns regarding medicines are outlined above.  Orders Placed This Encounter  Procedures   Lipid panel   ALT   MYOCARDIAL PERFUSION IMAGING   Meds ordered this encounter  Medications   metoprolol succinate (TOPROL-XL) 50 MG 24 hr tablet    Sig: Take 1 tablet (50 mg total) by mouth daily. Take with or immediately following a meal.    Dispense:  90 tablet    Refill:  3   hydrALAZINE (APRESOLINE) 50 MG tablet    Sig: Take 1.5 tablets (75 mg total) by mouth 3 (three) times daily.    Dispense:  405 tablet    Refill:  3    Patient Instructions  Medication Instructions:  Your physician has recommended you make the following change in your medication:  STOP: isosorbide mononitrate (Imdur)   DECREASE: hydralazine to 75 mg by mouth three times daily (1 and half pills to equal 75 mg)  REFILLED: metoprolol succinate 50 mg  *If you need a refill on your cardiac medications before your next appointment, please call your pharmacy*   Lab Work: IN 3 MONTHS: FLP, ALT (nothing to eat or drink 12 hours prior except water and black coffee)  If you have labs (blood  work) drawn today and your tests are completely normal, you will receive your results only by: MyChart Message (if you have MyChart) OR A paper copy in the mail If you have any lab test that is abnormal or we need to change your treatment, we will call you to review the results.   Testing/Procedures: Your physician has requested that you have en exercise stress myoview. For further information please visit [email or web address] Please follow instruction sheet, as given.    You are scheduled for a Myocardial Perfusion Imaging Study. Please arrive 15 minutes prior to your appointment time for registration and insurance purposes.   The test will take approximately 3 to 4 hours to complete; you may bring reading material.  If someone comes with you to your appointment, they will need to remain in the main lobby due to limited space in the testing area. **If you are pregnant or breastfeeding, please notify the nuclear lab prior to your appointment**   How to prepare for your Myocardial Perfusion Test: Do not eat or drink 3 hours prior to your test, except you may have water. Do not consume products containing caffeine (regular or decaffeinated) 12 hours prior to your test. (ex: coffee, chocolate, sodas, tea). Do bring a list of your current medications with you.  If not listed below, you may take your medications as normal.  DO NOT TAKE METOPROLOL SUCCINATE DAY OF TESTING Do wear comfortable clothes (no dresses or overalls) and walking shoes, tennis shoes preferred (No heels or  open toe shoes are allowed). Do NOT wear cologne, perfume, aftershave, or lotions (deodorant is allowed). If these instructions are not followed, your test will have to be rescheduled.  If you cannot keep your appointment, please provide 24 hours notification to the Nuclear Lab, to avoid a possible $50 charge to your account.       Follow-Up: At [facility], you and your health needs are our priority.  As part  of our continuing mission to provide you with exceptional heart care, we have created designated Provider Care Teams.  These Care Teams include your primary Cardiologist (physician) and Advanced Practice Providers (APPs -  Physician Assistants and Nurse Practitioners) who all work together to provide you with the care you need, when you need it.  Your next appointment:   8-9 month(s)  Provider:   Christell Constant, MD       Signed, Christell Constant, MD  12/10/2022 11:12 AM    [facility]

## 2022-12-09 NOTE — Telephone Encounter (Signed)
The patient has been notified of the result and verbalized understanding.  All questions (if any) were answered. Macie Burows, RN 12/09/2022 9:19 AM   Pt will schedule lab appointment at 12/10/22 OV with MD.

## 2022-12-10 ENCOUNTER — Encounter: Payer: Self-pay | Admitting: Internal Medicine

## 2022-12-10 ENCOUNTER — Ambulatory Visit: Payer: BC Managed Care – PPO | Attending: Internal Medicine | Admitting: Internal Medicine

## 2022-12-10 VITALS — BP 138/60 | HR 117 | Ht 72.0 in | Wt 172.0 lb

## 2022-12-10 DIAGNOSIS — I10 Essential (primary) hypertension: Secondary | ICD-10-CM

## 2022-12-10 DIAGNOSIS — R072 Precordial pain: Secondary | ICD-10-CM

## 2022-12-10 DIAGNOSIS — I2510 Atherosclerotic heart disease of native coronary artery without angina pectoris: Secondary | ICD-10-CM | POA: Diagnosis not present

## 2022-12-10 DIAGNOSIS — I4729 Other ventricular tachycardia: Secondary | ICD-10-CM

## 2022-12-10 DIAGNOSIS — E1040 Type 1 diabetes mellitus with diabetic neuropathy, unspecified: Secondary | ICD-10-CM

## 2022-12-10 MED ORDER — METOPROLOL SUCCINATE ER 50 MG PO TB24: 50.0000 mg | ORAL_TABLET | Freq: Every day | ORAL | 3 refills | Status: DC

## 2022-12-10 MED ORDER — HYDRALAZINE HCL 50 MG PO TABS: 75.0000 mg | ORAL_TABLET | Freq: Three times a day (TID) | ORAL | 3 refills | Status: DC

## 2022-12-10 NOTE — Patient Instructions (Signed)
Medication Instructions:  Your physician has recommended you make the following change in your medication:  STOP: isosorbide mononitrate (Imdur)   DECREASE: hydralazine to 75 mg by mouth three times daily (1 and half pills to equal 75 mg)  REFILLED: metoprolol succinate 50 mg  *If you need a refill on your cardiac medications before your next appointment, please call your pharmacy*   Lab Work: IN 3 MONTHS: FLP, ALT (nothing to eat or drink 12 hours prior except water and black coffee)  If you have labs (blood work) drawn today and your tests are completely normal, you will receive your results only by: MyChart Message (if you have MyChart) OR A paper copy in the mail If you have any lab test that is abnormal or we need to change your treatment, we will call you to review the results.   Testing/Procedures: Your physician has requested that you have en exercise stress myoview. For further information please visit [email or web address] Please follow instruction sheet, as given.    You are scheduled for a Myocardial Perfusion Imaging Study. Please arrive 15 minutes prior to your appointment time for registration and insurance purposes.   The test will take approximately 3 to 4 hours to complete; you may bring reading material.  If someone comes with you to your appointment, they will need to remain in the main lobby due to limited space in the testing area. **If you are pregnant or breastfeeding, please notify the nuclear lab prior to your appointment**   How to prepare for your Myocardial Perfusion Test: Do not eat or drink 3 hours prior to your test, except you may have water. Do not consume products containing caffeine (regular or decaffeinated) 12 hours prior to your test. (ex: coffee, chocolate, sodas, tea). Do bring a list of your current medications with you.  If not listed below, you may take your medications as normal.  DO NOT TAKE METOPROLOL SUCCINATE DAY OF TESTING Do wear  comfortable clothes (no dresses or overalls) and walking shoes, tennis shoes preferred (No heels or open toe shoes are allowed). Do NOT wear cologne, perfume, aftershave, or lotions (deodorant is allowed). If these instructions are not followed, your test will have to be rescheduled.  If you cannot keep your appointment, please provide 24 hours notification to the Nuclear Lab, to avoid a possible $50 charge to your account.       Follow-Up: At [facility], you and your health needs are our priority.  As part of our continuing mission to provide you with exceptional heart care, we have created designated Provider Care Teams.  These Care Teams include your primary Cardiologist (physician) and Advanced Practice Providers (APPs -  Physician Assistants and Nurse Practitioners) who all work together to provide you with the care you need, when you need it.  Your next appointment:   8-9 month(s)  Provider:   Christell Constant, MD

## 2022-12-11 DIAGNOSIS — Z992 Dependence on renal dialysis: Secondary | ICD-10-CM | POA: Diagnosis not present

## 2022-12-11 DIAGNOSIS — N186 End stage renal disease: Secondary | ICD-10-CM | POA: Diagnosis not present

## 2022-12-12 ENCOUNTER — Telehealth (HOSPITAL_COMMUNITY): Payer: Self-pay

## 2022-12-12 DIAGNOSIS — A499 Bacterial infection, unspecified: Secondary | ICD-10-CM | POA: Diagnosis not present

## 2022-12-12 DIAGNOSIS — B353 Tinea pedis: Secondary | ICD-10-CM | POA: Diagnosis not present

## 2022-12-13 DIAGNOSIS — N186 End stage renal disease: Secondary | ICD-10-CM | POA: Diagnosis not present

## 2022-12-13 DIAGNOSIS — Z992 Dependence on renal dialysis: Secondary | ICD-10-CM | POA: Diagnosis not present

## 2022-12-16 DIAGNOSIS — N186 End stage renal disease: Secondary | ICD-10-CM | POA: Diagnosis not present

## 2022-12-16 DIAGNOSIS — Z23 Encounter for immunization: Secondary | ICD-10-CM | POA: Diagnosis not present

## 2022-12-16 DIAGNOSIS — Z992 Dependence on renal dialysis: Secondary | ICD-10-CM | POA: Diagnosis not present

## 2022-12-17 ENCOUNTER — Ambulatory Visit (HOSPITAL_COMMUNITY): Payer: BC Managed Care – PPO

## 2022-12-18 DIAGNOSIS — N186 End stage renal disease: Secondary | ICD-10-CM | POA: Diagnosis not present

## 2022-12-18 DIAGNOSIS — Z992 Dependence on renal dialysis: Secondary | ICD-10-CM | POA: Diagnosis not present

## 2022-12-18 DIAGNOSIS — Z23 Encounter for immunization: Secondary | ICD-10-CM | POA: Diagnosis not present

## 2022-12-20 ENCOUNTER — Telehealth: Payer: Self-pay

## 2022-12-20 DIAGNOSIS — Z992 Dependence on renal dialysis: Secondary | ICD-10-CM | POA: Diagnosis not present

## 2022-12-20 DIAGNOSIS — Z23 Encounter for immunization: Secondary | ICD-10-CM | POA: Diagnosis not present

## 2022-12-20 DIAGNOSIS — N186 End stage renal disease: Secondary | ICD-10-CM | POA: Diagnosis not present

## 2022-12-20 NOTE — Telephone Encounter (Signed)
Final outreach attempt made for renal Safe Start program.  Call was successful. Renal coordinator verified patient using HIPAA compliant information. Patient shared she remembers prior calls and shared she was in and out of the hospital during that time due to both renal complications and gastroparesis. Patient shared she now has emergent cvc catheter placed and receives treatment MWF at [facility] center.   While completing assessment, patient shared she is unaware of what to eat, how to keep A1C down, and ability to walk comes and goes. Renal coordinator has put request for (4) weeks of renal meals to be received from [facility] meals. Patient will need to reach out to PCP for PREP referral to [facility] to aid in building physical strength back. Care management referral also made for LCSW, RN care coordination, as well as pharmacy as patient can not afford FreeStyle libre glucose meter.   Renal coordinator will follow up with patient bimonthly for check-in's.  Baruch Gouty Renal Coordinator [facility] Population Health [phone]

## 2022-12-23 ENCOUNTER — Telehealth: Payer: Self-pay

## 2022-12-23 ENCOUNTER — Telehealth: Payer: Self-pay | Admitting: *Deleted

## 2022-12-23 DIAGNOSIS — N186 End stage renal disease: Secondary | ICD-10-CM | POA: Diagnosis not present

## 2022-12-23 DIAGNOSIS — I10 Essential (primary) hypertension: Secondary | ICD-10-CM

## 2022-12-23 DIAGNOSIS — Z992 Dependence on renal dialysis: Secondary | ICD-10-CM | POA: Diagnosis not present

## 2022-12-23 DIAGNOSIS — N2581 Secondary hyperparathyroidism of renal origin: Secondary | ICD-10-CM | POA: Diagnosis not present

## 2022-12-23 DIAGNOSIS — E1040 Type 1 diabetes mellitus with diabetic neuropathy, unspecified: Secondary | ICD-10-CM

## 2022-12-23 NOTE — Progress Notes (Signed)
  Care Coordination   Note   12/23/2022 Name: [name] MRN: [id number] DOB: Jun 07, 1971  [name] is a 52 y.o. year old female who sees [place], [place], [place] for primary care. I reached out to [facility] by phone today to offer care coordination services.  [name] was given information about Care Coordination services today including:   The Care Coordination services include support from the care team which includes your Nurse Coordinator, Clinical Social Worker, or Pharmacist.  The Care Coordination team is here to help remove barriers to the health concerns and goals most important to you. Care Coordination services are voluntary, and the patient may decline or stop services at any time by request to their care team member.   Care Coordination Consent Status: Patient agreed to services and verbal consent obtained.   Follow up plan:  Telephone appointment with care coordination team member scheduled for:  12/26/22  Encounter Outcome:  Pt. Scheduled  [facility] Coordination Care Guide  Direct Dial: [phone]

## 2022-12-23 NOTE — Progress Notes (Signed)
  Care Coordination  Outreach Note  12/23/2022 Name: [name] MRN: [id number] DOB: [birth year]   Care Coordination Outreach Attempts: An unsuccessful telephone outreach was attempted today to offer the patient information about available care coordination services.  Follow Up Plan:  Additional outreach attempts will be made to offer the patient care coordination information and services.   Encounter Outcome:  No Answer  Christie Nottingham  Care Coordination Care Guide  Direct Dial: [phone]

## 2022-12-24 DIAGNOSIS — H35033 Hypertensive retinopathy, bilateral: Secondary | ICD-10-CM | POA: Diagnosis not present

## 2022-12-24 DIAGNOSIS — H2513 Age-related nuclear cataract, bilateral: Secondary | ICD-10-CM | POA: Diagnosis not present

## 2022-12-24 DIAGNOSIS — E113513 Type 2 diabetes mellitus with proliferative diabetic retinopathy with macular edema, bilateral: Secondary | ICD-10-CM | POA: Diagnosis not present

## 2022-12-24 DIAGNOSIS — H43823 Vitreomacular adhesion, bilateral: Secondary | ICD-10-CM | POA: Diagnosis not present

## 2022-12-25 DIAGNOSIS — N186 End stage renal disease: Secondary | ICD-10-CM | POA: Diagnosis not present

## 2022-12-25 DIAGNOSIS — N2581 Secondary hyperparathyroidism of renal origin: Secondary | ICD-10-CM | POA: Diagnosis not present

## 2022-12-25 DIAGNOSIS — Z992 Dependence on renal dialysis: Secondary | ICD-10-CM | POA: Diagnosis not present

## 2022-12-26 ENCOUNTER — Ambulatory Visit: Payer: Self-pay

## 2022-12-26 DIAGNOSIS — F419 Anxiety disorder, unspecified: Secondary | ICD-10-CM | POA: Diagnosis not present

## 2022-12-26 DIAGNOSIS — E785 Hyperlipidemia, unspecified: Secondary | ICD-10-CM | POA: Diagnosis not present

## 2022-12-26 DIAGNOSIS — E1143 Type 2 diabetes mellitus with diabetic autonomic (poly)neuropathy: Secondary | ICD-10-CM | POA: Diagnosis not present

## 2022-12-26 DIAGNOSIS — I10 Essential (primary) hypertension: Secondary | ICD-10-CM | POA: Diagnosis not present

## 2022-12-26 DIAGNOSIS — Z0000 Encounter for general adult medical examination without abnormal findings: Secondary | ICD-10-CM | POA: Diagnosis not present

## 2022-12-26 NOTE — Patient Instructions (Signed)
Visit Information  Thank you for taking time to visit with me today. Please don't hesitate to contact me if I can be of assistance to you before our next scheduled telephone appointment.   Our next appointment is by telephone on 01/02/2023   Please call the care guide team at [phone] if you need to cancel or reschedule your appointment.   If you are experiencing a Mental Health or Behavioral Health Crisis or need someone to talk to, please call the Suicide and Crisis Lifeline: 988 call the [place] National Suicide Prevention Lifeline: (864)472-3186 or TTY: 3123811425 TTY 815-653-5065) to talk to a trained counselor call 1-800-273-TALK (toll free, 24 hour hotline) go to [facility] [address], [place] [phone]) call 911   Patient verbalizes understanding of instructions and care plan provided today and agrees to view in MyChart. Active MyChart status and patient understanding of how to access instructions and care plan via MyChart confirmed with patient.     Rowe Pavy, RN, BSN, CEN [facility] [phone]

## 2022-12-26 NOTE — Patient Outreach (Signed)
Care Coordination   Initial Visit Note   12/26/2022 Name: [name] MRN: [id number] DOB: 03/25/1971  [name] is a 52 y.o. year old female who sees [place], [place], [place] for primary care. I spoke with  [name] by phone today.  What matters to the patients health and wellness today?  Initial assessment with patient today.  Patient reports she has had DM since giving birth.  ( 25 years ago).  Reports she was an out of control diabetic due to cost of medications for years.  Reports she is divorced and raising her children alone ( 27 and 79) Had an MI 04/2022, started dialysis 10/2022.  Goes to dialysis 3 days per week ( MWF) Currently using Free style Libre 3 and insulin pump.  Reports her DM is complicated by neuropathy, retinal issues and gastroparesis.  Reports vomiting daily. Reports that she is trying to get adjusted to dialysis but feels very weak and tired.  Reports she currently has wounds on her feet. Saw dermatology for wounds on feet that are not healing. Patient has follow up with PCP today to evaluate her feet and legs.  Patient reports that she is social isolated and depressed.  States she has no income and has applied for disability. Reports her children are helping her as much as they can. Patient reports she will start receiving STRIVE meals today to help with renal diet. Reports she has spoken with dietician at the dialysis center.  CBG today of 187.    Goals Addressed               This Visit's Progress     Care coordination for DM and complications (pt-stated)        Interventions Today    Flowsheet Row Most Recent Value  Chronic Disease   Chronic disease during today's visit Diabetes, Chronic Kidney Disease/End Stage Renal Disease (ESRD)  General Interventions   General Interventions Discussed/Reviewed General Interventions Discussed, Annual Eye Exam, Labs, Annual Foot Exam, Durable Medical Equipment (DME), Sick Day Rules, Doctor Visits  Labs Hgb A1c  every 3 months  Doctor Visits Discussed/Reviewed Doctor Visits Discussed, PCP, Specialist  Durable Medical Equipment (DME) Glucomoter, Insulin Pump, Walker  Exercise Interventions   Exercise Discussed/Reviewed Exercise Discussed  [currently not able to exercise]  Education Interventions   Education Provided Provided Education  Provided Verbal Education On Nutrition, Foot Care, Eye Care, Labs, Blood Sugar Monitoring, Mental Health/Coping with Illness, Exercise, Medication, When to see the doctor  Labs Reviewed Hgb A1c, Kidney Function  Mental Health Interventions   Mental Health Discussed/Reviewed Refer to Social Work for counseling  Refer to Social Work for counseling regarding Depression  Nutrition Interventions   Nutrition Discussed/Reviewed Nutrition Discussed  [renal diet and new meals from [employer]  Pharmacy Interventions   Pharmacy Dicussed/Reviewed Medications and their functions  Safety Interventions   Safety Discussed/Reviewed Fall Risk  Advanced Directive Interventions   Advanced Directives Discussed/Reviewed Advanced Directives Discussed, Provided resource for acquiring and filling out documents  [mailed advanced directive packer]     Reviewed DM and control. Currently has insulin pump.  CBG today of 187 Discussed importance of tighter control to promote healing of wounds Discussed foot care.  Reviewed follow up at PCP office today about concerns. Encouraged patient to keep wounds clean and dry. Advanced directive packet mailed. Referral to LCSW for counseling and depression, isolation, advanced directive follow up, questions about disability.  Provided my contact information. This note sent to MD.  Provided a  listening ear and offered support and understanding.         SDOH assessments and interventions completed:  Yes  SDOH Interventions Today    Flowsheet Row Most Recent Value  SDOH Interventions   Food Insecurity Interventions Intervention Not Indicated   Housing Interventions Intervention Not Indicated  Transportation Interventions Intervention Not Indicated  Utilities Interventions Intervention Not Indicated  Alcohol Usage Interventions Intervention Not Indicated (Score <7)  Depression Interventions/Treatment  Medication  Financial Strain Interventions Other (Comment)  [filing for disability,  no income.]  Physical Activity Interventions Intervention Not Indicated  Social Connections Interventions Other (Comment)  [would like assistance with social isolation, Referal to LCSW]        Care Coordination Interventions:  Yes, provided   Follow up plan: Follow up call scheduled for 01/02/2023    Encounter Outcome:  Pt. Visit Completed   Rowe Pavy, RN, BSN, CEN [facility] Coordinator [phone]

## 2022-12-27 DIAGNOSIS — N2581 Secondary hyperparathyroidism of renal origin: Secondary | ICD-10-CM | POA: Diagnosis not present

## 2022-12-27 DIAGNOSIS — N186 End stage renal disease: Secondary | ICD-10-CM | POA: Diagnosis not present

## 2022-12-27 DIAGNOSIS — Z992 Dependence on renal dialysis: Secondary | ICD-10-CM | POA: Diagnosis not present

## 2022-12-27 NOTE — Progress Notes (Signed)
Scheduled 6/4 with SW   [facility] Guide  Direct Dial: [phone]

## 2022-12-30 DIAGNOSIS — N186 End stage renal disease: Secondary | ICD-10-CM | POA: Diagnosis not present

## 2022-12-30 DIAGNOSIS — Z992 Dependence on renal dialysis: Secondary | ICD-10-CM | POA: Diagnosis not present

## 2022-12-30 DIAGNOSIS — N2581 Secondary hyperparathyroidism of renal origin: Secondary | ICD-10-CM | POA: Diagnosis not present

## 2023-01-01 DIAGNOSIS — N2581 Secondary hyperparathyroidism of renal origin: Secondary | ICD-10-CM | POA: Diagnosis not present

## 2023-01-01 DIAGNOSIS — Z992 Dependence on renal dialysis: Secondary | ICD-10-CM | POA: Diagnosis not present

## 2023-01-01 DIAGNOSIS — N186 End stage renal disease: Secondary | ICD-10-CM | POA: Diagnosis not present

## 2023-01-02 ENCOUNTER — Ambulatory Visit (INDEPENDENT_AMBULATORY_CARE_PROVIDER_SITE_OTHER): Payer: BC Managed Care – PPO | Admitting: Orthopedic Surgery

## 2023-01-02 ENCOUNTER — Ambulatory Visit: Payer: Self-pay

## 2023-01-02 DIAGNOSIS — T25221A Burn of second degree of right foot, initial encounter: Secondary | ICD-10-CM

## 2023-01-02 MED ORDER — SILVER SULFADIAZINE 1 % EX CREA: 1.0000 | TOPICAL_CREAM | Freq: Every day | CUTANEOUS | 3 refills | Status: DC

## 2023-01-02 NOTE — Patient Outreach (Signed)
  Care Coordination   01/02/2023 Name: [name] MRN: [id number] DOB: [birth year]   Care Coordination Outreach Attempts:  An unsuccessful telephone outreach was attempted for a scheduled appointment today.  Follow Up Plan:  Additional outreach attempts will be made to offer the patient care coordination information and services.   Encounter Outcome:  No Answer   Care Coordination Interventions:  No, not indicated    Rowe Pavy, RN, BSN, [facility] [phone]

## 2023-01-03 ENCOUNTER — Encounter: Payer: Self-pay | Admitting: Orthopedic Surgery

## 2023-01-03 DIAGNOSIS — Z992 Dependence on renal dialysis: Secondary | ICD-10-CM | POA: Diagnosis not present

## 2023-01-03 DIAGNOSIS — I129 Hypertensive chronic kidney disease with stage 1 through stage 4 chronic kidney disease, or unspecified chronic kidney disease: Secondary | ICD-10-CM | POA: Diagnosis not present

## 2023-01-03 DIAGNOSIS — N186 End stage renal disease: Secondary | ICD-10-CM | POA: Diagnosis not present

## 2023-01-03 DIAGNOSIS — N2581 Secondary hyperparathyroidism of renal origin: Secondary | ICD-10-CM | POA: Diagnosis not present
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
## 2023-01-06 DIAGNOSIS — N2581 Secondary hyperparathyroidism of renal origin: Secondary | ICD-10-CM | POA: Diagnosis not present

## 2023-01-06 DIAGNOSIS — L299 Pruritus, unspecified: Secondary | ICD-10-CM | POA: Diagnosis not present

## 2023-01-06 DIAGNOSIS — Z992 Dependence on renal dialysis: Secondary | ICD-10-CM | POA: Diagnosis not present

## 2023-01-06 DIAGNOSIS — N186 End stage renal disease: Secondary | ICD-10-CM | POA: Diagnosis not present

## 2023-01-07 ENCOUNTER — Ambulatory Visit: Payer: Self-pay | Admitting: Licensed Clinical Social Worker

## 2023-01-07 NOTE — Patient Instructions (Signed)
Social Work Visit Information  Thank you for taking time to visit with me today. Please don't hesitate to contact me if I can be of assistance to you.   Following are the goals we discussed today:   Goals Addressed             This Visit's Progress    Emotional support with adjustment to health       Activities and task to complete in order to accomplish goals.   You have decided not to move forward with counseling and would like to work with me on brief coping skills to assist you with managing symptoms of anxiety Keep all upcoming appointment discussed today Continue with compliance of taking medication prescribed by Doctor Complete Advance Directive packet,  Have advance directive notarized and provide a copy to provider office  Review EMMI educational information on Advance Directive. Look for an e-mail from [facility]  Per your request I have also e-mail the information below [facility] and substance challenges) [address]   [place] [phone]    [email or web address]     [address]  Women's Group in person at [employer] center and virtual 12:30 to 2:00   Road to Recovery group Thursday 6:30 to 7:00  Black Women's group 6:30-8:00  Hope Group ( dual diagnosis) Wed.  [employer]ness (NAMI) [place]- Wellness classes, Support groups        [address], [place], [place] [address]   [email or web address]           Our next appointment is by telephone on 01/16/23 at 8:30   Please call the care guide team at [phone] if you need to cancel or reschedule your appointment.   If you or anyone you know are experiencing a Mental Health or Behavioral Health Crisis or need someone to talk to, please call the Suicide and Crisis Lifeline: 988 call the [place] National Suicide Prevention  Lifeline: (810) 548-3380 or TTY: 586-821-6035 TTY (272) 173-1928) to talk to a trained counselor call 1-800-273-TALK (toll free, 24 hour hotline)   Patient verbalizes understanding of instructions and care plan provided today and agrees to view in MyChart. Active MyChart status and patient understanding of how to access instructions and care plan via MyChart confirmed with patient.       Sammuel Hines, LCSW Social Work Care Coordination  [facility] [employer] [phone]

## 2023-01-07 NOTE — Patient Outreach (Signed)
  Care Coordination  Initial Visit Note   01/07/2023 Name: [name] MRN: [id number] DOB: [birth year]  [name] is a 52 y.o. year old female who sees [place], [place], [place] for primary care. I spoke with  [name] by phone today.  What matters to the patients health and wellness today?  Managing symptoms of anxiety   Patient is experiencing symptoms of anxiety which seems to be exacerbated by decline in her health and adjusting to her new normal. Patient is unable to afford the $50.00 co-pay for ongoing therapy. Recommendation: Patient may benefit from, and is in agreement to explore community mental health resources discussed today. .    Goals Addressed             This Visit's Progress    Emotional support with adjustment to health       Activities and task to complete in order to accomplish goals.   You have decided not to move forward with counseling and would like to work with me on brief coping skills to assist you with managing symptoms of anxiety Keep all upcoming appointment discussed today Continue with compliance of taking medication prescribed by Doctor Complete Advance Directive packet,  Have advance directive notarized and provide a copy to provider office  Review EMMI educational information on Advance Directive. Look for an e-mail from [facility].  Per your request I have also e-mail the information below [facility] and substance challenges) [address]   [place] [phone]    [email or web address]     [address]  Women's Group in person at [facility] and virtual 12:30 to 2:00   Road to Recovery group Thursday 6:30 to 7:00  Black Women's group 6:30-8:00  Hope Group ( dual diagnosis) Wed.  [employer] on Mental Illness (NAMI) [place]- Wellness classes, Support groups        [address], [place], [place] [address]    [email or web address]          SDOH assessments and interventions completed:  Yes  SDOH Interventions Today    Flowsheet Row Most Recent Value  SDOH Interventions   Stress Interventions Provide Counseling       Care Coordination Interventions:  Yes, provided  Interventions Today    Flowsheet Row Most Recent Value  Chronic Disease   Chronic disease during today's visit Hypertension (HTN), Congestive Heart Failure (CHF), Diabetes, Chronic Kidney Disease/End Stage Renal Disease (ESRD)  General Interventions   General Interventions Discussed/Reviewed General Interventions Reviewed  Education Interventions   Education Provided Provided Education, Provided Web-based Education  [EMMI educational information]  Mental Health Interventions   Mental Health Discussed/Reviewed Mental Health Discussed, Coping Strategies  [solution focused, task center & problem solving]  Advanced Directive Interventions   Advanced Directives Discussed/Reviewed Advanced Directives Discussed, Provided resource for acquiring and filling out documents       Follow up plan: Follow up call scheduled for 01/16/23    Encounter Outcome:  Pt. Visit Completed   Sammuel Hines, LCSW Social Work Care Coordination  [facility] [employer] [phone]

## 2023-01-08 DIAGNOSIS — N2581 Secondary hyperparathyroidism of renal origin: Secondary | ICD-10-CM | POA: Diagnosis not present

## 2023-01-08 DIAGNOSIS — Z992 Dependence on renal dialysis: Secondary | ICD-10-CM | POA: Diagnosis not present

## 2023-01-08 DIAGNOSIS — L299 Pruritus, unspecified: Secondary | ICD-10-CM | POA: Diagnosis not present

## 2023-01-08 DIAGNOSIS — N186 End stage renal disease: Secondary | ICD-10-CM | POA: Diagnosis not present

## 2023-01-09 ENCOUNTER — Ambulatory Visit (INDEPENDENT_AMBULATORY_CARE_PROVIDER_SITE_OTHER): Payer: BC Managed Care – PPO | Admitting: Orthopedic Surgery

## 2023-01-09 DIAGNOSIS — T25221A Burn of second degree of right foot, initial encounter: Secondary | ICD-10-CM

## 2023-01-10 DIAGNOSIS — N186 End stage renal disease: Secondary | ICD-10-CM | POA: Diagnosis not present

## 2023-01-10 DIAGNOSIS — Z992 Dependence on renal dialysis: Secondary | ICD-10-CM | POA: Diagnosis not present

## 2023-01-10 DIAGNOSIS — N2581 Secondary hyperparathyroidism of renal origin: Secondary | ICD-10-CM | POA: Diagnosis not present

## 2023-01-10 DIAGNOSIS — L299 Pruritus, unspecified: Secondary | ICD-10-CM | POA: Diagnosis not present

## 2023-01-13 DIAGNOSIS — N186 End stage renal disease: Secondary | ICD-10-CM | POA: Diagnosis not present

## 2023-01-13 DIAGNOSIS — N2581 Secondary hyperparathyroidism of renal origin: Secondary | ICD-10-CM | POA: Diagnosis not present

## 2023-01-13 DIAGNOSIS — Z992 Dependence on renal dialysis: Secondary | ICD-10-CM | POA: Diagnosis not present

## 2023-01-14 ENCOUNTER — Telehealth: Payer: Self-pay | Admitting: *Deleted

## 2023-01-14 NOTE — Progress Notes (Signed)
  Care Coordination Note  01/14/2023 Name: [name] MRN: [id number] DOB: [birth year]  [name] is a 52 y.o. year old female who is a primary care patient of [place], [name], [place] and is actively engaged with the care management team. I reached out to [name] by phone today to assist with re-scheduling a follow up visit with the RN Case Manager  Follow up plan: Unsuccessful telephone outreach attempt made. A HIPAA compliant phone message was left for the patient providing contact information and requesting a return call.   Burman Nieves, CCMA Care Coordination Care Guide Direct Dial: [phone]

## 2023-01-15 DIAGNOSIS — Z992 Dependence on renal dialysis: Secondary | ICD-10-CM | POA: Diagnosis not present

## 2023-01-15 DIAGNOSIS — N2581 Secondary hyperparathyroidism of renal origin: Secondary | ICD-10-CM | POA: Diagnosis not present

## 2023-01-15 DIAGNOSIS — N186 End stage renal disease: Secondary | ICD-10-CM | POA: Diagnosis not present

## 2023-01-16 ENCOUNTER — Ambulatory Visit: Payer: Self-pay | Admitting: Licensed Clinical Social Worker

## 2023-01-16 NOTE — Patient Instructions (Signed)
Social Work Visit Information  Thank you for taking time to visit with me today. Please don't hesitate to contact me if I can be of assistance to you.   Following are the goals we discussed today:   Goals Addressed             This Visit's Progress    COMPLETED: Emotional support with adjustment to health       Activities and task to complete in order to accomplish goals.   Keep all upcoming appointment discussed today Continue with compliance of taking medication prescribed by Doctor Complete Advance Directive packet,  Have advance directive notarized and provide a copy to provider office   Please utilize information below to connect for community support for groups Feel free to contact me if you need someone to talk to one on one  [facility] and substance challenges) [address]   [place] [phone]    [email or web address]     [address]  Women's Group in person at [facility] and virtual 12:30 to 2:00   Road to Recovery group Thursday 6:30 to 7:00  Black Women's group 6:30-8:00  Hope Group ( dual diagnosis) Wed.  [employer] Mental Illness (NAMI) [place]- Wellness classes, Support groups        [address] [place], [place] [address]   [email or web address]           Patient does not desire continued follow-up by social work. They will contact the office if needed  Please call the care guide team at [phone] if you need to cancel or reschedule your appointment.   If you or anyone you know are experiencing a Mental Health or Behavioral Health Crisis or need someone to talk to, please call the Suicide and Crisis Lifeline: 988 call the [place] National Suicide Prevention Lifeline: 312-701-6433 or TTY: (559) 651-5080 TTY 334-888-4979) to talk to a trained counselor call 1-800-273-TALK (toll free, 24  hour hotline) go to [facility] [address] [place] [phone])   Patient verbalizes understanding of instructions and care plan provided today and agrees to view in MyChart. Active MyChart status and patient understanding of how to access instructions and care plan via MyChart confirmed with patient.      Sammuel Hines, LCSW Social Work Care Coordination  [facility] [employer] [phone]

## 2023-01-16 NOTE — Patient Outreach (Signed)
  Care Coordination  Follow Up Visit Note   01/16/2023 Name: [name] MRN: [id number] DOB: 1970/12/26  [name] is a 52 y.o. year old female who sees [place], [place], [place] for primary care. I spoke with  [name] by phone today.  What matters to the patients health and wellness today?  Managing her health  Patient reports information on mental health community support provided by LCSW is very helpful.  She has a lot going on right now and has not followed up the resources.  No new needs identified during this encounter. Patient declined ongoing support, will contact LCSW if needed.     Goals Addressed             This Visit's Progress    COMPLETED: Emotional support with adjustment to health       Activities and task to complete in order to accomplish goals.   Keep all upcoming appointment discussed today Continue with compliance of taking medication prescribed by Doctor Complete Advance Directive packet,  Have advance directive notarized and provide a copy to provider office   Please utilize information below to connect for community support for groups Feel free to contact me if you need someone to talk to one on one  [facility] and substance challenges) [address]   [place] [phone]    [email or web address]     [address]  Women's Group in person at [facility] and virtual 12:30 to 2:00   Road to Recovery group Thursday 6:30 to 7:00  Black Women's group 6:30-8:00  Hope Group ( dual diagnosis) Wed.  [employer] on Mental Illness (NAMI) [place]- Wellness classes, Support groups        [address] [place], [place] [address]   [email or web address]          SDOH assessments and interventions completed:  No   Care Coordination Interventions:  Yes, provided  Interventions Today     Flowsheet Row Most Recent Value  Chronic Disease   Chronic disease during today's visit Hypertension (HTN), Diabetes, Chronic Kidney Disease/End Stage Renal Disease (ESRD)  General Interventions   General Interventions Discussed/Reviewed General Interventions Reviewed, Referral to Nurse  [scheduled with RN care manager]  Education Interventions   Provided Verbal Education On Mental Health/Coping with Illness  Mental Health Interventions   Mental Health Discussed/Reviewed Mental Health Reviewed, Coping Strategies  [provided resouces]  Advanced Directive Interventions   Advanced Directives Discussed/Reviewed Advanced Directives Reviewed, Advanced Care Planning  [has documents and information,  will continue to work on]       Follow up plan:  No further intervention required for LCSW at this time Follow up call scheduled for RN care manager June 27th   Encounter Outcome:  Pt. Visit Completed   Sammuel Hines, LCSW Social Work Care Coordination  [facility] [employer] [employer] [phone]

## 2023-01-17 DIAGNOSIS — N2581 Secondary hyperparathyroidism of renal origin: Secondary | ICD-10-CM | POA: Diagnosis not present

## 2023-01-17 DIAGNOSIS — Z992 Dependence on renal dialysis: Secondary | ICD-10-CM | POA: Diagnosis not present

## 2023-01-17 DIAGNOSIS — N186 End stage renal disease: Secondary | ICD-10-CM | POA: Diagnosis not present

## 2023-01-20 ENCOUNTER — Encounter: Payer: Self-pay | Admitting: Orthopedic Surgery

## 2023-01-20 DIAGNOSIS — N186 End stage renal disease: Secondary | ICD-10-CM | POA: Diagnosis not present

## 2023-01-20 DIAGNOSIS — Z992 Dependence on renal dialysis: Secondary | ICD-10-CM | POA: Diagnosis not present

## 2023-01-20 DIAGNOSIS — N2581 Secondary hyperparathyroidism of renal origin: Secondary | ICD-10-CM | POA: Diagnosis not present

## 2023-01-20 DIAGNOSIS — Z23 Encounter for immunization: Secondary | ICD-10-CM | POA: Diagnosis not present

## 2023-01-20 NOTE — Progress Notes (Signed)
Office Visit Note   Patient: [name]           Date of Birth: [birth year]           MRN: [id number] Visit Date: 01/09/2023              Requested by: Delma Officer, PA [address] [place],  [place] [address] PCP: Delma Officer, [place]  Chief Complaint  Patient presents with   Left Foot - Follow-up   Right Foot - Follow-up      HPI: Patient is a 52 year old woman who is seen in follow-up for burns both feet.  She is using Silvadene dressing changes.  Assessment & Plan: Visit Diagnoses:  1. Partial thickness burn of right foot, initial encounter     Plan: Continue with Silvadene dressing changes Dial soap cleansing.  Follow-Up Instructions: Return in about 4 weeks (around 02/06/2023).   Ortho Exam  Patient is alert, oriented, no adenopathy, well-dressed, normal affect, normal respiratory effort. Examination patient has improved granulation tissue on the right foot.  No ulcerations on the left foot.  Approximately 50% improvement in the wound size.  There is healthy granulation tissue.  Imaging: No results found.      Labs: Lab Results  Component Value Date   HGBA1C 7.5 (H) 10/05/2022   HGBA1C 6.4 (H) 07/12/2022   HGBA1C 6.3 (H) 03/25/2022   ESRSEDRATE 40 (H) 04/27/2022   ESRSEDRATE 25 (H) 06/08/2020   CRP 8.8 (H) 10/07/2022   CRP 9.8 (H) 10/06/2022   CRP 2.8 (H) 10/05/2022   REPTSTATUS 10/26/2022 FINAL 10/21/2022   CULT  10/21/2022    NO GROWTH 5 DAYS Performed at [facility], [address], [place], [place] [address]    LABORGA ESCHERICHIA COLI (A) 03/27/2022     Lab Results  Component Value Date   ALBUMIN 2.8 (L) 11/05/2022   ALBUMIN 4.3 11/04/2022   ALBUMIN 2.5 (L) 10/22/2022    Lab Results  Component Value Date   MG 1.6 (L) 10/21/2022   MG 1.7 10/11/2022   MG 1.8 10/10/2022   Lab Results  Component Value Date   VD25OH 17 (L) 09/08/2013   VD25OH 13 (L) 12/02/2012    No results found for:  "PREALBUMIN"    Latest Ref Rng & Units 11/19/2022    6:27 AM 11/05/2022    2:09 AM 11/04/2022    7:33 AM  CBC EXTENDED  WBC 4.0 - 10.5 K/uL  7.6  7.6   RBC 3.87 - 5.11 MIL/uL  3.30  3.92   Hemoglobin 12.0 - 15.0 g/dL 74.2  9.8  59.5   HCT 63.8 - 46.0 % 37.0  30.9  35.2   Platelets 150 - 400 K/uL  178  189   NEUT# 1.7 - 7.7 K/uL   5.5   Lymph# 0.7 - 4.0 K/uL   1.5      There is no height or weight on file to calculate BMI.  Orders:  No orders of the defined types were placed in this encounter.  No orders of the defined types were placed in this encounter.    Procedures: No procedures performed  Clinical Data: No additional findings.  ROS:  All other systems negative, except as noted in the HPI. Review of Systems  Objective: Vital Signs: LMP 11/17/2021 (Approximate)   Specialty Comments:  No specialty comments available.  PMFS History: Patient Active Problem List   Diagnosis Date Noted   NSVT (nonsustained ventricular tachycardia) (  HCC) 10/07/2022   Chronic combined systolic and diastolic heart failure (HCC) 10/07/2022   Hematemesis 10/04/2022   Pneumonia due to respiratory syncytial virus (RSV) 10/04/2022   CAD (coronary artery disease) 09/08/2022   Metabolic acidosis 09/08/2022   Cardiac microvascular disease 09/05/2022   Mixed hyperlipidemia 09/05/2022   Familial hypercholesterolemia 09/05/2022   ESRD (end stage renal disease) (HCC) 07/23/2022   Malnutrition of moderate degree 07/16/2022   Chest pain 07/12/2022   Protein-calorie malnutrition, severe 06/21/2022   GI bleeding 05/23/2022   Abnormal CT scan, gastrointestinal tract 05/23/2022   Gastroparesis 05/13/2022   NSTEMI (non-ST elevated myocardial infarction) (HCC) 05/13/2022   Epigastric pain    Chronic vomiting    Anemia of chronic disease    History of esophagitis    Intractable nausea and vomiting secondary to diabetic gastroparesis 04/23/2022   Cardiac arrest (HCC) 04/05/2022   Essential  hypertension 04/05/2022   Inguinal adenopathy 12/14/2021   Diabetic gastroparesis (HCC) 08/31/2020   Gastric polyps    Bone pain 06/16/2020   Deficiency anemia 04/21/2020   Leg edema 11/21/2019   Dysfunctional uterine bleeding 10/11/2019   Nausea and vomiting    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HCC) 07/30/2016   Multiple thyroid nodules 07/30/2016   Complicated grieving 05/06/2014   Diabetic foot ulcers (HCC) 05/01/2013   Skin rash 01/05/2013   Contraception management 12/31/2012   Mood disorder (HCC) 12/30/2012   Unspecified vitamin D deficiency 12/03/2012   H/O thyroid nodule 12/03/2012   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) 06/18/2007   Past Medical History:  Diagnosis Date   AICD (automatic cardioverter/defibrillator) present    [employer]   Anemia    Anxiety    Arthritis    Asthma    CHF (congestive heart failure) (HCC)    Chronic diastolic CHF (congestive heart failure) (HCC) 12/14/2019   Depression    Diabetic ulcer of left foot (HCC) 05/12/2013   ESRD on hemodialysis (HCC)    MWF at [facility]   Gastroparesis    Generalized abdominal pain    History of chicken pox    Loss of weight 12/02/2019   Migraines    Mixed hyperlipidemia 09/05/2022   Mood disorder (HCC)    anxiety   Myocardial infarction (HCC)    Prurigo nodularis    with diabetic dermopathy   Type 1 diabetes mellitus (HCC) 05/23/2022   Type 1 diabetes, uncontrolled, with neuropathy    [place]   Ulcers of both lower legs (HCC) 02/20/2014    Family History  Problem Relation Age of Onset   Diabetes Mother        type 2   Hypertension Mother    Thyroid disease Mother    Bipolar disorder Mother    Heart disease Mother    Calcium disorder Mother    Cancer Maternal Grandmother        Breast, stomach   Cancer Paternal Grandmother        stomach, lung (smoker)   Diabetes Paternal Grandmother    Diabetes Paternal Grandfather    Heart failure Sister    Diabetes Sister     Stroke Maternal Aunt    Cancer Maternal Uncle        prostate   CAD Maternal Aunt        stents   Cancer Maternal Aunt 83       ovarian    Past Surgical History:  Procedure Laterality Date   AV FISTULA PLACEMENT Left 10/01/2022   Procedure: ARTERIOVENOUS (AV)FISTULA  CREATION;  Surgeon: Larina Earthly, MD;  Location: [facility];  Service: Vascular;  Laterality: Left;   AV FISTULA PLACEMENT Right 11/19/2022   Procedure: INSERTION OF RIGHT ARM ARTERIOVENOUS (AV) GORE-TEX GRAFT;  Surgeon: Chuck Hint, MD;  Location[facility];  Service: Vascular;  Laterality: Right;   BIOPSY  08/11/2020   Procedure: BIOPSY;  Surgeon: Meryl Dare, MD;  Location: [facility];  Service: Endoscopy;;   BIOPSY  04/02/2022   Procedure: BIOPSY;  Surgeon: Lynann Bologna, MD;  Location: [facility];  Service: Gastroenterology;;   ESOPHAGOGASTRODUODENOSCOPY (EGD) WITH PROPOFOL N/A 08/11/2020   Procedure: ESOPHAGOGASTRODUODENOSCOPY (EGD) WITH PROPOFOL;  Surgeon: Meryl Dare, MD;  Location: [facility];  Service: Endoscopy;  Laterality: N/A;   ESOPHAGOGASTRODUODENOSCOPY (EGD) WITH PROPOFOL N/A 04/02/2022   Procedure: ESOPHAGOGASTRODUODENOSCOPY (EGD) WITH PROPOFOL;  Surgeon: Lynann Bologna, MD;  Location: [facility];  Service: Gastroenterology;  Laterality: N/A;   ESOPHAGOGASTRODUODENOSCOPY (EGD) WITH PROPOFOL N/A 05/23/2022   Procedure: ESOPHAGOGASTRODUODENOSCOPY (EGD) WITH PROPOFOL;  Surgeon: Midge Minium, MD;  Location: [facility] ENDOSCOPY;  Service: Endoscopy;  Laterality: N/A;   ICD IMPLANT N/A 04/09/2022   Procedure: ICD IMPLANT;  Surgeon: [name], MD;  Location: [facility] INVASIVE CV LAB;  Service: Cardiovascular;  Laterality: N/A;   IR FLUORO GUIDE CV LINE RIGHT  10/07/2022   IR US GUIDE VASC ACCESS RIGHT  10/07/2022   treadmill stress test  01/2013   WNL, low risk study   Social History   Occupational History   Occupation: Production designer, theatre/television/film  Tobacco Use   Smoking status: Never   Smokeless tobacco: Never  Vaping  Use   Vaping Use: Never used  Substance and Sexual Activity   Alcohol use: Not Currently   Drug use: No   Sexual activity: Yes    Partners: Male    Birth control/protection: None

## 2023-01-21 DIAGNOSIS — Z452 Encounter for adjustment and management of vascular access device: Secondary | ICD-10-CM | POA: Diagnosis not present

## 2023-01-21 DIAGNOSIS — N186 End stage renal disease: Secondary | ICD-10-CM | POA: Diagnosis not present

## 2023-01-21 DIAGNOSIS — Z992 Dependence on renal dialysis: Secondary | ICD-10-CM | POA: Diagnosis not present

## 2023-01-22 DIAGNOSIS — N186 End stage renal disease: Secondary | ICD-10-CM | POA: Diagnosis not present

## 2023-01-22 DIAGNOSIS — N2581 Secondary hyperparathyroidism of renal origin: Secondary | ICD-10-CM | POA: Diagnosis not present

## 2023-01-22 DIAGNOSIS — Z992 Dependence on renal dialysis: Secondary | ICD-10-CM | POA: Diagnosis not present

## 2023-01-22 DIAGNOSIS — Z23 Encounter for immunization: Secondary | ICD-10-CM | POA: Diagnosis not present

## 2023-01-24 DIAGNOSIS — Z23 Encounter for immunization: Secondary | ICD-10-CM | POA: Diagnosis not present

## 2023-01-24 DIAGNOSIS — N186 End stage renal disease: Secondary | ICD-10-CM | POA: Diagnosis not present

## 2023-01-24 DIAGNOSIS — N2581 Secondary hyperparathyroidism of renal origin: Secondary | ICD-10-CM | POA: Diagnosis not present

## 2023-01-24 DIAGNOSIS — Z992 Dependence on renal dialysis: Secondary | ICD-10-CM | POA: Diagnosis not present

## 2023-01-27 DIAGNOSIS — Z992 Dependence on renal dialysis: Secondary | ICD-10-CM | POA: Diagnosis not present

## 2023-01-27 DIAGNOSIS — N186 End stage renal disease: Secondary | ICD-10-CM | POA: Diagnosis not present

## 2023-01-27 DIAGNOSIS — N2581 Secondary hyperparathyroidism of renal origin: Secondary | ICD-10-CM | POA: Diagnosis not present

## 2023-01-29 DIAGNOSIS — Z992 Dependence on renal dialysis: Secondary | ICD-10-CM | POA: Diagnosis not present

## 2023-01-29 DIAGNOSIS — N186 End stage renal disease: Secondary | ICD-10-CM | POA: Diagnosis not present

## 2023-01-29 DIAGNOSIS — N2581 Secondary hyperparathyroidism of renal origin: Secondary | ICD-10-CM | POA: Diagnosis not present

## 2023-01-30 ENCOUNTER — Ambulatory Visit: Payer: Self-pay

## 2023-01-30 NOTE — Patient Outreach (Signed)
  Care Coordination   01/30/2023 Name: [name] MRN: [id number] DOB: 09/08/1970   Care Coordination Outreach Attempts:  An unsuccessful telephone outreach was attempted for a scheduled appointment today.  Placed call to patient for scheduled follow up visit. No answer and mail box is full.   Follow Up Plan:  Additional outreach attempts will be made to offer the patient care coordination information and services.   Encounter Outcome:  No Answer   Care Coordination Interventions:  No, not indicated    Rowe Pavy, RN, BSN, [facility] [employer] [phone]

## 2023-01-31 DIAGNOSIS — N186 End stage renal disease: Secondary | ICD-10-CM | POA: Diagnosis not present

## 2023-01-31 DIAGNOSIS — Z992 Dependence on renal dialysis: Secondary | ICD-10-CM | POA: Diagnosis not present

## 2023-01-31 DIAGNOSIS — N2581 Secondary hyperparathyroidism of renal origin: Secondary | ICD-10-CM | POA: Diagnosis not present

## 2023-01-31 NOTE — Progress Notes (Signed)
  Care Coordination Note  01/31/2023 Name: [name] MRN: [id number] DOB: [birth year]  [name] is a 52 y.o. year old female who is a primary care patient of [place], [name], [place] and is actively engaged with the care management team. I reached out to [facility] by phone today to assist with re-scheduling a follow up visit with the RN Case Manager  Follow up plan: We have been unable to make contact with the patient for follow up.   Burman Nieves, CCMA Care Coordination Care Guide Direct Dial: [phone]

## 2023-02-02 DIAGNOSIS — I129 Hypertensive chronic kidney disease with stage 1 through stage 4 chronic kidney disease, or unspecified chronic kidney disease: Secondary | ICD-10-CM | POA: Diagnosis not present

## 2023-02-02 DIAGNOSIS — N186 End stage renal disease: Secondary | ICD-10-CM | POA: Diagnosis not present

## 2023-02-02 DIAGNOSIS — Z992 Dependence on renal dialysis: Secondary | ICD-10-CM | POA: Diagnosis not present

## 2023-02-03 DIAGNOSIS — Z992 Dependence on renal dialysis: Secondary | ICD-10-CM | POA: Diagnosis not present

## 2023-02-03 DIAGNOSIS — N2581 Secondary hyperparathyroidism of renal origin: Secondary | ICD-10-CM | POA: Diagnosis not present

## 2023-02-03 DIAGNOSIS — N186 End stage renal disease: Secondary | ICD-10-CM | POA: Diagnosis not present

## 2023-02-04 ENCOUNTER — Ambulatory Visit (INDEPENDENT_AMBULATORY_CARE_PROVIDER_SITE_OTHER): Payer: BC Managed Care – PPO

## 2023-02-04 DIAGNOSIS — I472 Ventricular tachycardia, unspecified: Secondary | ICD-10-CM

## 2023-02-04 LAB — CUP PACEART REMOTE DEVICE CHECK
Battery Remaining Longevity: 128 mo
Battery Voltage: 3.04 V
Brady Statistic RV Percent Paced: 0 %
Date Time Interrogation Session: 20240702022601
HighPow Impedance: 72 Ohm
Implantable Lead Connection Status: 753985
Implantable Lead Implant Date: 20230905
Implantable Lead Location: 753860
Implantable Pulse Generator Implant Date: 20230905
Lead Channel Impedance Value: 266 Ohm
Lead Channel Impedance Value: 342 Ohm
Lead Channel Pacing Threshold Amplitude: 0.875 V
Lead Channel Pacing Threshold Pulse Width: 0.4 ms
Lead Channel Sensing Intrinsic Amplitude: 6 mV
Lead Channel Sensing Intrinsic Amplitude: 6 mV
Lead Channel Setting Pacing Amplitude: 2 V
Lead Channel Setting Pacing Pulse Width: 0.4 ms
Lead Channel Setting Sensing Sensitivity: 0.3 mV
Zone Setting Status: 755011
Zone Setting Status: 755011

## 2023-02-05 DIAGNOSIS — Z992 Dependence on renal dialysis: Secondary | ICD-10-CM | POA: Diagnosis not present

## 2023-02-05 DIAGNOSIS — N2581 Secondary hyperparathyroidism of renal origin: Secondary | ICD-10-CM | POA: Diagnosis not present

## 2023-02-05 DIAGNOSIS — N186 End stage renal disease: Secondary | ICD-10-CM | POA: Diagnosis not present

## 2023-02-07 DIAGNOSIS — N186 End stage renal disease: Secondary | ICD-10-CM | POA: Diagnosis not present

## 2023-02-07 DIAGNOSIS — Z992 Dependence on renal dialysis: Secondary | ICD-10-CM | POA: Diagnosis not present

## 2023-02-07 DIAGNOSIS — N2581 Secondary hyperparathyroidism of renal origin: Secondary | ICD-10-CM | POA: Diagnosis not present

## 2023-02-10 DIAGNOSIS — N186 End stage renal disease: Secondary | ICD-10-CM | POA: Diagnosis not present

## 2023-02-10 DIAGNOSIS — N2581 Secondary hyperparathyroidism of renal origin: Secondary | ICD-10-CM | POA: Diagnosis not present

## 2023-02-10 DIAGNOSIS — Z992 Dependence on renal dialysis: Secondary | ICD-10-CM | POA: Diagnosis not present

## 2023-02-11 ENCOUNTER — Ambulatory Visit: Payer: BC Managed Care – PPO | Admitting: Orthopedic Surgery

## 2023-02-11 DIAGNOSIS — E113511 Type 2 diabetes mellitus with proliferative diabetic retinopathy with macular edema, right eye: Secondary | ICD-10-CM | POA: Diagnosis not present

## 2023-02-12 ENCOUNTER — Telehealth: Payer: Self-pay | Admitting: Internal Medicine

## 2023-02-12 DIAGNOSIS — N186 End stage renal disease: Secondary | ICD-10-CM | POA: Diagnosis not present

## 2023-02-12 DIAGNOSIS — N2581 Secondary hyperparathyroidism of renal origin: Secondary | ICD-10-CM | POA: Diagnosis not present

## 2023-02-12 DIAGNOSIS — Z992 Dependence on renal dialysis: Secondary | ICD-10-CM | POA: Diagnosis not present

## 2023-02-12 NOTE — Telephone Encounter (Signed)
  Pt c/o BP issue: STAT if pt c/o blurred vision, one-sided weakness or slurred speech  1. What are your last 5 BP readings? 195/151   2. Are you having any other symptoms (ex. Dizziness, headache, blurred vision, passed out)? Fatigue, blurred vision, headache  3. What is your BP issue? Pt states during dialysis her bp has been really high, fatigue, and experiencing blurry vision.  Pt is still at dialysis now and nurse there advise she call Cardiology. Please advise.

## 2023-02-12 NOTE — Telephone Encounter (Signed)
Patient finishes dialysis around 3:30 and thinks she could arrive at 4:15 for appointment on 7/12.  Appointment made for patient to see Dr Lynnette Caffey (DOD) on 7/12 at 4:30

## 2023-02-12 NOTE — Telephone Encounter (Signed)
Received call transferred directly from operator and spoke with patient.  She is currently at dialysis and reports she was advised to call cardiologist to schedule sooner appointment due to BP issues.  Patient reports she has been having problems with BP for about a month.  BP will be elevated prior to dialysis and then drop low during dialysis.  Most recent BP is 195/151 and she is currently receiving dialysis at this time.  Patient reports blurred vision has been going on for about a month.  Has seen eye doctor.  Patient has not been taking metoprolol as she thought it had been stopped.  I told patient per last office note she was to continue metoprolol succinate 50 mg daily. Will look for earlier appointment and call patient back. ED precautions reviewed with patient.

## 2023-02-12 NOTE — Telephone Encounter (Signed)
I spoke with patient and offered her appointment with Dr Tollie Pizza on 7/12 but she is unable to be here due to dialysis schedule. Needs appointment on Tuesday/Thursday or early AM on Monday, Wednesday or Friday. Appointment made for patient to see Eligha Bridegroom, NP on 7/15 at 8:50.  I advised patient if BP remains as elevated as it currently is she be evaluated in ED.  Patient will resume metoprolol

## 2023-02-13 DIAGNOSIS — R110 Nausea: Secondary | ICD-10-CM | POA: Diagnosis not present

## 2023-02-13 DIAGNOSIS — R0789 Other chest pain: Secondary | ICD-10-CM | POA: Diagnosis not present

## 2023-02-13 DIAGNOSIS — R252 Cramp and spasm: Secondary | ICD-10-CM | POA: Diagnosis not present

## 2023-02-13 DIAGNOSIS — I5042 Chronic combined systolic (congestive) and diastolic (congestive) heart failure: Secondary | ICD-10-CM | POA: Diagnosis not present

## 2023-02-13 NOTE — Progress Notes (Signed)
Cardiology Office Note:    Date:  02/14/2023   ID:  [name], DOB 11/10/1970, MRN [id number]  PCP:  Delma Officer, PA   [facility] Providers Primary cardiologist: Ladona Ridgel DOD Cardiologist:  Alverda Skeans, MD Referring MD: Delma Officer, [place]   Chief Complaint/Reason for Referral: Hypertension  PATIENT DID NOT APPEAR FOR APPOINTMENT   ASSESSMENT:    1. Hypertension associated with diabetes (HCC)   2. 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 Hyperlipidemia associated with type 2 diabetes mellitus (HCC)   4. ESRD (end stage renal disease) (HCC)   5. Aortic atherosclerosis (HCC)   6. ICD (implantable cardioverter-defibrillator) in place   7. NICM (nonischemic cardiomyopathy) (HCC)   8. BMI 24.0-24.9, adult     PLAN:    In order of problems listed above: 1.  Hypertension: 2.  Type 1 diabetes: Continue aspirin and pravastatin; not a candidate for SGLT2 inhibition given end-stage renal disease.  Not a candidate for GLP-1 receptor agonist therapy. 3.  Hyperlipidemia: Recent LDL was not at goal and her pravastatin was restarted.  Zetia was also restarted with plans to check lipid panel in August. 4.  End-stage renal disease: 5.  Aortic atherosclerosis: Continue aspirin and statin therapy. 6.  Status post ICD: Patient follows with EP. 7.  Nonischemic cardiomyopathy: Continue Toprol. 8.  Elevated BMI: Not a candidate for GLP-1 receptor agonist therapy.   History of Present Illness:    FOCUSED PROBLEM LIST:   1.  Hypertension 2.  Type 1 diabetes with chronic gastroparesis 3.  End-stage renal disease on dialysis; being evaluated for renal transplantation 4.  Hyperlipidemia 5.  Aborted sudden cardiac death 09/13/23status post ICD 6.  Nonischemic cardiomyopathy with ejection fraction around 40% 7.  Aortic atherosclerosis on chest CT 2024 8.  BMI of 24  The patient is a 52 y.o. female with the indicated medical  history here for DOD appointment due to blood pressure issues.  The patient reached out to the office recently due to the fact she has had widely fluctuating blood pressures around dialysis.  Apparently her blood pressure was 195/151 and was accompanied by fatigue, blurred vision, and headache.  This all occurred while she was at dialysis.  She called [place] from her dialysis session.  Because of these issues she was referred for expedited DOD appointment.  She was seen by her PCP yesterday and her BP was 118/82 mmHg.      Previous Medical History: Past Medical History:  Diagnosis Date   AICD (automatic cardioverter/defibrillator) present    Medtronic   Anemia    Anxiety    Arthritis    Asthma    CHF (congestive heart failure) (HCC)    Chronic diastolic CHF (congestive heart failure) (HCC) 12/14/2019   Depression    Diabetic ulcer of left foot (HCC) 05/12/2013   ESRD on hemodialysis (HCC)    MWF at [facility]   Gastroparesis    Generalized abdominal pain    History of chicken pox    Loss of weight 12/02/2019   Migraines    Mixed hyperlipidemia 09/05/2022   Mood disorder (HCC)    anxiety   Myocardial infarction (HCC)    Prurigo nodularis    with diabetic dermopathy   Type 1 diabetes mellitus (HCC) 05/23/2022   Type 1 diabetes, uncontrolled, with neuropathy    Phadke   Ulcers of both lower legs (HCC) 02/20/2014     Current Medications: No outpatient medications  have been marked as taking for the 02/14/23 encounter (Office Visit) with Orbie Pyo, MD.     Allergies:    Atorvastatin, Dulaglutide, Metoclopramide, Other, Sertraline, Vancomycin, Zofran [ondansetron], Amoxicillin, Lantus [insulin glargine], and Miconazole nitrate   Social History:   Social History   Tobacco Use   Smoking status: Never   Smokeless tobacco: Never  Vaping Use   Vaping status: Never Used  Substance Use Topics   Alcohol use: Not Currently   Drug use: No     Family Hx: Family History   Problem Relation Age of Onset   Diabetes Mother        type 2   Hypertension Mother    Thyroid disease Mother    Bipolar disorder Mother    Heart disease Mother    Calcium disorder Mother    Cancer Maternal Grandmother        Breast, stomach   Cancer Paternal Grandmother        stomach, lung (smoker)   Diabetes Paternal Grandmother    Diabetes Paternal Grandfather    Heart failure Sister    Diabetes Sister    Stroke Maternal Aunt    Cancer Maternal Uncle        prostate   CAD Maternal Aunt        stents   Cancer Maternal Aunt 64       ovarian     Review of Systems:   Please see the history of present illness.    All other systems reviewed and are negative.     EKGs/Labs/Other Test Reviewed:    EKG:    EKG Interpretation Date/Time:    Ventricular Rate:    PR Interval:    QRS Duration:    QT Interval:    QTC Calculation:   R Axis:      Text Interpretation:           Prior CV studies reviewed:  Cardiac Studies & Procedures       ECHOCARDIOGRAM  ECHOCARDIOGRAM COMPLETE 10/05/2022  Narrative ECHOCARDIOGRAM REPORT    Patient Name:   [name] Date of Exam: 10/05/2022 Medical Rec #:  [id number]      Height:       72.0 in Accession #:    [id number]     Weight:       179.5 lb Date of Birth:  1971-06-19     BSA:          2.035 m Patient Age:    51 years       BP:           120/72 mmHg Patient Gender: F              HR:           94 bpm. Exam Location:  Inpatient  Procedure: 2D Echo, Cardiac Doppler and Color Doppler  Indications:    Chest Pain R07.9  History:        Patient has no prior history of Echocardiogram examinations. CHF, CAD and Previous Myocardial Infarction; Risk Factors:Diabetes and Hypertension.  Sonographer:    Louie Boston RDCS Referring Phys: [id number] [facility]  IMPRESSIONS   1. Left ventricular ejection fraction, by estimation, is 40 to 45%. The left ventricle has mildly decreased function. The left ventricle  demonstrates global hypokinesis. There is mild concentric left ventricular hypertrophy. Left ventricular diastolic parameters are consistent with Grade III diastolic dysfunction (restrictive). Elevated left atrial pressure. 2. Right ventricular systolic function is mildly reduced.  The right ventricular size is moderately enlarged. There is moderately elevated pulmonary artery systolic pressure. 3. Left atrial size was moderately dilated. 4. Right atrial size was moderately dilated. 5. The mitral valve is normal in structure. No evidence of mitral valve regurgitation. No evidence of mitral stenosis. 6. Tricuspid valve regurgitation is moderate to severe. 7. The aortic valve is tricuspid. Aortic valve regurgitation is not visualized. No aortic stenosis is present. 8. The inferior vena cava is dilated in size with <50% respiratory variability, suggesting right atrial pressure of 15 mmHg.  Comparison(s): Prior images reviewed side by side. The left ventricular function is significantly worse. The right ventricular systolic function is worse. TR severity and PA hypertension are also worse, with evidence of hypervolemia.  FINDINGS Left Ventricle: Left ventricular ejection fraction, by estimation, is 40 to 45%. The left ventricle has mildly decreased function. The left ventricle demonstrates global hypokinesis. The left ventricular internal cavity size was normal in size. There is mild concentric left ventricular hypertrophy. Left ventricular diastolic parameters are consistent with Grade III diastolic dysfunction (restrictive). Elevated left atrial pressure.  Right Ventricle: The right ventricular size is moderately enlarged. No increase in right ventricular wall thickness. Right ventricular systolic function is mildly reduced. There is moderately elevated pulmonary artery systolic pressure. The tricuspid regurgitant velocity is 3.05 m/s, and with an assumed right atrial pressure of 15 mmHg, the estimated  right ventricular systolic pressure is 52.2 mmHg.  Left Atrium: Left atrial size was moderately dilated.  Right Atrium: Right atrial size was moderately dilated.  Pericardium: Trivial pericardial effusion is present.  Mitral Valve: The mitral valve is normal in structure. There is mild thickening of the mitral valve leaflet(s). Mild mitral annular calcification. No evidence of mitral valve regurgitation. No evidence of mitral valve stenosis.  Tricuspid Valve: The tricuspid valve is normal in structure. Tricuspid valve regurgitation is moderate to severe.  Aortic Valve: The aortic valve is tricuspid. Aortic valve regurgitation is not visualized. No aortic stenosis is present.  Pulmonic Valve: The pulmonic valve was normal in structure. Pulmonic valve regurgitation is trivial.  Aorta: The aortic root and ascending aorta are structurally normal, with no evidence of dilitation.  Venous: The inferior vena cava is dilated in size with less than 50% respiratory variability, suggesting right atrial pressure of 15 mmHg.  IAS/Shunts: No atrial level shunt detected by color flow Doppler.  Additional Comments: A device lead is visualized in the right ventricle. There is pleural effusion in the left lateral region.   LEFT VENTRICLE PLAX 2D LVIDd:         4.70 cm     Diastology LVIDs:         3.80 cm     LV e' medial:    6.53 cm/s LV PW:         1.30 cm     LV E/e' medial:  16.1 LV IVS:        1.14 cm     LV e' lateral:   8.05 cm/s LVOT diam:     2.00 cm     LV E/e' lateral: 13.0 LV SV:         43 LV SV Index:   21 LVOT Area:     3.14 cm  LV Volumes (MOD) LV vol d, MOD A2C: 85.9 ml LV vol d, MOD A4C: 78.0 ml LV vol s, MOD A2C: 54.3 ml LV vol s, MOD A4C: 53.8 ml LV SV MOD A2C:     31.6 ml LV  SV MOD A4C:     78.0 ml LV SV MOD BP:      31.4 ml  RIGHT VENTRICLE            IVC RV S prime:     9.90 cm/s  IVC diam: 2.40 cm TAPSE (M-mode): 1.7 cm  LEFT ATRIUM           Index        RIGHT  ATRIUM           Index LA diam:      4.67 cm 2.30 cm/m   RA Area:     19.40 cm LA Vol (A2C): 82.5 ml 40.55 ml/m  RA Volume:   58.20 ml  28.60 ml/m LA Vol (A4C): 66.5 ml 32.68 ml/m AORTIC VALVE LVOT Vmax:   77.50 cm/s LVOT Vmean:  58.000 cm/s LVOT VTI:    0.138 m  AORTA Ao Root diam: 3.40 cm Ao Asc diam:  3.30 cm Ao Desc diam: 2.40 cm  MITRAL VALVE                TRICUSPID VALVE MV Area (PHT): 6.96 cm     TR Peak grad:   37.2 mmHg MV Decel Time: 109 msec     TR Vmax:        305.00 cm/s MV E velocity: 105.00 cm/s MV A velocity: 41.60 cm/s   SHUNTS MV E/A ratio:  2.52         Systemic VTI:  0.14 m Systemic Diam: 2.00 cm  Rachelle Hora Croitoru MD Electronically signed by Thurmon Fair MD Signature Date/Time: 10/05/2022/2:53:24 PM    Final     CT SCANS  CT CORONARY MORPH W/CTA COR W/SCORE 01/19/2020  Addendum 01/19/2020  3:10 PM ADDENDUM REPORT: 01/19/2020 15:08  EXAM: OVER-READ INTERPRETATION  CT CHEST  The following report is an over-read performed by radiologist Dr. Deberah Pelton [facility] on 01/19/2020. This over-read does not include interpretation of cardiac or coronary anatomy or pathology. The coronary calcium score/coronary CTA interpretation by the cardiologist is attached.  COMPARISON:  None.  FINDINGS: The visualized portions of the lower lung fields show no suspicious nodules, masses, or infiltrates. Small bilateral pleural effusions are seen.  The visualized portions of the mediastinum and chest wall are unremarkable.  IMPRESSION: Small bilateral pleural effusions. No other significant non-cardiovascular abnormality seen in visualized portion of the thorax.   Electronically Signed By: Danae Orleans M.D. On: 01/19/2020 15:08  Narrative CLINICAL DATA:  Shortness of breath/Normal stress test/Rule out balanced ischemia  EXAM: Cardiac/Coronary CTA  TECHNIQUE: The patient was scanned on a [employer]. A 100  kV retrospective scan was triggered in the descending thoracic aorta at 111 HU's. Axial non-contrast 3 mm slices were carried out through the heart. The data set was analyzed on a dedicated work station and scored using the Agatson method. Gantry rotation speed was 250 msecs and collimation was .6 mm. 5 mg IV diltiazem and 0.8 mg of sl NTG was given. The 3D data set was reconstructed in 5% intervals of the 35-75 % of the R-R cycle. Diastolic phases were analyzed on a dedicated work station using MPR, MIP and VRT modes. The patient received 80 cc of contrast.  FINDINGS: Image quality: excellent.  Noise artifact is: Limited.  Coronary Arteries:  Normal coronary origin.  Right dominance.  Left main: The left main is a large caliber vessel with a normal take off from the left coronary cusp that bifurcates to form  a left anterior descending artery and a left circumflex artery. There is no plaque or stenosis.  Left anterior descending artery: The LAD is patent without evidence of plaque or stenosis. The LAD gives off 1 large patent diagonal branch.  Left circumflex artery: The LCX is non-dominant and patent with no evidence of plaque or stenosis. The LCX gives off 1 patent obtuse marginal branch.  Right coronary artery: The RCA is dominant with normal take off from the right coronary cusp. There is no evidence of plaque or stenosis. The RCA terminates as a PDA and right posterolateral branch without evidence of plaque or stenosis.  Right Atrium: Right atrial size is severely dilated.  Right Ventricle: The right ventricular cavity is within normal limits.  Left Atrium: Left atrial size is severely dilated with no left atrial appendage filling defect.  Left Ventricle: The ventricular cavity size is within normal limits. There are no stigmata of prior infarction. There is no abnormal filling defect.  Pulmonary arteries: Normal size without proximal filling defect.  Pulmonary  veins: Normal pulmonary venous drainage.  Pericardium: Normal thickness with no significant effusion or calcium present.  Cardiac valves: The aortic valve is trileaflet without significant calcification. The mitral valve is normal structure without significant calcification.  Aorta: Normal caliber with no significant disease.  Extra-cardiac findings: See attached radiology report for non-cardiac structures.  IMPRESSION: 1. Coronary calcium score of 0.  2. Normal coronary origin with right dominance.  3. Normal coronary arteries.  4. Severe biatrial enlargement.  Lennie Odor, MD  Electronically Signed: By: Lennie Odor On: 01/19/2020 12:56          Other studies Reviewed: Aortic atherosclerosis on chest CT 2024  Recent Labs: 10/09/2022: B Natriuretic Peptide 1,719.6 10/21/2022: Magnesium 1.6; TSH 1.587 11/05/2022: Platelets 178 11/19/2022: BUN 23; Creatinine, Ser 3.60; Hemoglobin 12.6; Potassium 4.0; Sodium 137 12/05/2022: ALT 6   Lipid Panel    Component Value Date/Time   CHOL 262 (H) 12/05/2022 0848   TRIG 94 12/05/2022 0848   HDL 63 12/05/2022 0848   CHOLHDL 4.2 12/05/2022 0848   CHOLHDL 5.2 10/12/2019 0834   VLDL 16 10/12/2019 0834   LDLCALC 183 (H) 12/05/2022 0848   LDLDIRECT 104.1 04/12/2014 1608    Risk Assessment/Calculations:           Physical Exam:      Signed, Orbie Pyo, MD  02/14/2023 4:52 PM    [facility] [address] [place], [place], [place]  [address] Phone: [phone]; Fax: [phone]   Note:  This document was prepared using Dragon voice recognition software and may include unintentional dictation errors.

## 2023-02-14 ENCOUNTER — Encounter: Payer: Self-pay | Admitting: Internal Medicine

## 2023-02-14 ENCOUNTER — Ambulatory Visit: Payer: BC Managed Care – PPO | Attending: Nurse Practitioner | Admitting: Internal Medicine

## 2023-02-14 DIAGNOSIS — Z6824 Body mass index (BMI) 24.0-24.9, adult: Secondary | ICD-10-CM

## 2023-02-14 DIAGNOSIS — I700 Atherosclerosis of aorta: Secondary | ICD-10-CM

## 2023-02-14 DIAGNOSIS — N186 End stage renal disease: Secondary | ICD-10-CM

## 2023-02-14 DIAGNOSIS — E1159 Type 2 diabetes mellitus with other circulatory complications: Secondary | ICD-10-CM

## 2023-02-14 DIAGNOSIS — Z992 Dependence on renal dialysis: Secondary | ICD-10-CM | POA: Diagnosis not present

## 2023-02-14 DIAGNOSIS — E1169 Type 2 diabetes mellitus with other specified complication: Secondary | ICD-10-CM

## 2023-02-14 DIAGNOSIS — E1040 Type 1 diabetes mellitus with diabetic neuropathy, unspecified: Secondary | ICD-10-CM

## 2023-02-14 DIAGNOSIS — Z95810 Presence of automatic (implantable) cardiac defibrillator: Secondary | ICD-10-CM

## 2023-02-14 DIAGNOSIS — I428 Other cardiomyopathies: Secondary | ICD-10-CM

## 2023-02-14 DIAGNOSIS — N2581 Secondary hyperparathyroidism of renal origin: Secondary | ICD-10-CM | POA: Diagnosis not present

## 2023-02-17 ENCOUNTER — Ambulatory Visit: Payer: BC Managed Care – PPO | Admitting: Nurse Practitioner

## 2023-02-17 DIAGNOSIS — Z992 Dependence on renal dialysis: Secondary | ICD-10-CM | POA: Diagnosis not present

## 2023-02-17 DIAGNOSIS — N2581 Secondary hyperparathyroidism of renal origin: Secondary | ICD-10-CM | POA: Diagnosis not present

## 2023-02-17 DIAGNOSIS — N186 End stage renal disease: Secondary | ICD-10-CM | POA: Diagnosis not present

## 2023-02-18 DIAGNOSIS — H35033 Hypertensive retinopathy, bilateral: Secondary | ICD-10-CM | POA: Diagnosis not present

## 2023-02-18 DIAGNOSIS — H43823 Vitreomacular adhesion, bilateral: Secondary | ICD-10-CM | POA: Diagnosis not present

## 2023-02-18 DIAGNOSIS — E113513 Type 2 diabetes mellitus with proliferative diabetic retinopathy with macular edema, bilateral: Secondary | ICD-10-CM | POA: Diagnosis not present

## 2023-02-19 DIAGNOSIS — Z992 Dependence on renal dialysis: Secondary | ICD-10-CM | POA: Diagnosis not present

## 2023-02-19 DIAGNOSIS — N186 End stage renal disease: Secondary | ICD-10-CM | POA: Diagnosis not present

## 2023-02-19 DIAGNOSIS — N2581 Secondary hyperparathyroidism of renal origin: Secondary | ICD-10-CM | POA: Diagnosis not present

## 2023-02-20 ENCOUNTER — Ambulatory Visit (INDEPENDENT_AMBULATORY_CARE_PROVIDER_SITE_OTHER): Payer: BC Managed Care – PPO | Admitting: Orthopedic Surgery

## 2023-02-20 ENCOUNTER — Encounter: Payer: Self-pay | Admitting: Orthopedic Surgery

## 2023-02-20 DIAGNOSIS — T25221A Burn of second degree of right foot, initial encounter: Secondary | ICD-10-CM | POA: Diagnosis not present

## 2023-02-20 NOTE — Progress Notes (Signed)
Office Visit Note   Patient: [name]           Date of Birth: 1971/06/13           MRN: [id number] Visit Date: 02/20/2023              Requested by: Delma Officer, PA [address] [place],  [place] [address] PCP: Delma Officer, [place]  Chief Complaint  Patient presents with   Right Foot - Wound Check      HPI: Patient is a 52 year old woman who is seen in follow-up for burn plantar aspect right foot.  Assessment & Plan: Visit Diagnoses:  1. Partial thickness burn of right foot, initial encounter     Plan: Her wounds continue to heal well there is healthy granulation tissue continue with cleansing and Silvadene dressing changes  Follow-Up Instructions: No follow-ups on file.   Ortho Exam  Patient is alert, oriented, no adenopathy, well-dressed, normal affect, normal respiratory effort. Examination the wounds are quite small on the plantar aspect the right foot she has 1 residual wound on the plantar aspect of the second toe approximately 5 mm in diameter and a residual wound on the plantar aspect of the right foot that is 1 cm in diameter both are flat with healthy granulation tissue.  There is good superficial epithelialization around the wounds.  Imaging: No results found.   Labs: Lab Results  Component Value Date   HGBA1C 7.5 (H) 10/05/2022   HGBA1C 6.4 (H) 07/12/2022   HGBA1C 6.3 (H) 03/25/2022   ESRSEDRATE 40 (H) 04/27/2022   ESRSEDRATE 25 (H) 06/08/2020   CRP 8.8 (H) 10/07/2022   CRP 9.8 (H) 10/06/2022   CRP 2.8 (H) 10/05/2022   REPTSTATUS 10/26/2022 FINAL 10/21/2022   CULT  10/21/2022    NO GROWTH 5 DAYS Performed at [facility], [address], [place], [place] [address]    LABORGA ESCHERICHIA COLI (A) 03/27/2022     Lab Results  Component Value Date   ALBUMIN 2.8 (L) 11/05/2022   ALBUMIN 4.3 11/04/2022   ALBUMIN 2.5 (L) 10/22/2022    Lab Results  Component Value Date   MG 1.6 (L) 10/21/2022   MG 1.7  10/11/2022   MG 1.8 10/10/2022   Lab Results  Component Value Date   VD25OH 17 (L) 09/08/2013   VD25OH 13 (L) 12/02/2012    No results found for: "PREALBUMIN"    Latest Ref Rng & Units 11/19/2022    6:27 AM 11/05/2022    2:09 AM 11/04/2022    7:33 AM  CBC EXTENDED  WBC 4.0 - 10.5 K/uL  7.6  7.6   RBC 3.87 - 5.11 MIL/uL  3.30  3.92   Hemoglobin 12.0 - 15.0 g/dL 29.5  9.8  28.4   HCT 13.2 - 46.0 % 37.0  30.9  35.2   Platelets 150 - 400 K/uL  178  189   NEUT# 1.7 - 7.7 K/uL   5.5   Lymph# 0.7 - 4.0 K/uL   1.5      There is no height or weight on file to calculate BMI.  Orders:  No orders of the defined types were placed in this encounter.  No orders of the defined types were placed in this encounter.    Procedures: No procedures performed  Clinical Data: No additional findings.  ROS:  All other systems negative, except as noted in the HPI. Review of Systems  Objective: Vital Signs: LMP 11/17/2021 (  Approximate)   Specialty Comments:  No specialty comments available.  PMFS History: Patient Active Problem List   Diagnosis Date Noted   NSVT (nonsustained ventricular tachycardia) (HCC) 10/07/2022   Chronic combined systolic and diastolic heart failure (HCC) 10/07/2022   Hematemesis 10/04/2022   Pneumonia due to respiratory syncytial virus (RSV) 10/04/2022   CAD (coronary artery disease) 09/08/2022   Metabolic acidosis 09/08/2022   Cardiac microvascular disease 09/05/2022   Mixed hyperlipidemia 09/05/2022   Familial hypercholesterolemia 09/05/2022   ESRD (end stage renal disease) (HCC) 07/23/2022   Malnutrition of moderate degree 07/16/2022   Chest pain 07/12/2022   Protein-calorie malnutrition, severe 06/21/2022   GI bleeding 05/23/2022   Abnormal CT scan, gastrointestinal tract 05/23/2022   Gastroparesis 05/13/2022   NSTEMI (non-ST elevated myocardial infarction) (HCC) 05/13/2022   Epigastric pain    Chronic vomiting    Anemia of chronic disease     History of esophagitis    Intractable nausea and vomiting secondary to diabetic gastroparesis 04/23/2022   Cardiac arrest (HCC) 04/05/2022   Essential hypertension 04/05/2022   Inguinal adenopathy 12/14/2021   Diabetic gastroparesis (HCC) 08/31/2020   Gastric polyps    Bone pain 06/16/2020   Deficiency anemia 04/21/2020   Leg edema 11/21/2019   Dysfunctional uterine bleeding 10/11/2019   Nausea and vomiting    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HCC) 07/30/2016   Multiple thyroid nodules 07/30/2016   Complicated grieving 05/06/2014   Diabetic foot ulcers (HCC) 05/01/2013   Skin rash 01/05/2013   Contraception management 12/31/2012   Mood disorder (HCC) 12/30/2012   Unspecified vitamin D deficiency 12/03/2012   H/O thyroid nodule 12/03/2012   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) 06/18/2007   Past Medical History:  Diagnosis Date   AICD (automatic cardioverter/defibrillator) present    Medtronic   Anemia    Anxiety    Arthritis    Asthma    CHF (congestive heart failure) (HCC)    Chronic diastolic CHF (congestive heart failure) (HCC) 12/14/2019   Depression    Diabetic ulcer of left foot (HCC) 05/12/2013   ESRD on hemodialysis (HCC)    MWF at [facility]   Gastroparesis    Generalized abdominal pain    History of chicken pox    Loss of weight 12/02/2019   Migraines    Mixed hyperlipidemia 09/05/2022   Mood disorder (HCC)    anxiety   Myocardial infarction (HCC)    Prurigo nodularis    with diabetic dermopathy   Type 1 diabetes mellitus (HCC) 05/23/2022   Type 1 diabetes, uncontrolled, with neuropathy    Phadke   Ulcers of both lower legs (HCC) 02/20/2014    Family History  Problem Relation Age of Onset   Diabetes Mother        type 2   Hypertension Mother    Thyroid disease Mother    Bipolar disorder Mother    Heart disease Mother    Calcium disorder Mother    Cancer Maternal Grandmother        Breast, stomach   Cancer Paternal  Grandmother        stomach, lung (smoker)   Diabetes Paternal Grandmother    Diabetes Paternal Grandfather    Heart failure Sister    Diabetes Sister    Stroke Maternal Aunt    Cancer Maternal Uncle        prostate   CAD Maternal Aunt        stents   Cancer Maternal Aunt 23  ovarian    Past Surgical History:  Procedure Laterality Date   AV FISTULA PLACEMENT Left 10/01/2022   Procedure: ARTERIOVENOUS (AV)FISTULA CREATION;  Surgeon: Larina Earthly, MD;  Location: [facility];  Service: Vascular;  Laterality: Left;   AV FISTULA PLACEMENT Right 11/19/2022   Procedure: INSERTION OF RIGHT ARM ARTERIOVENOUS (AV) GORE-TEX GRAFT;  Surgeon: Chuck Hint, MD;  Location: [facility] OR;  Service: Vascular;  Laterality: Right;   BIOPSY  08/11/2020   Procedure: BIOPSY;  Surgeon: Meryl Dare, MD;  Location: [facility] ENDOSCOPY;  Service: Endoscopy;;   BIOPSY  04/02/2022   Procedure: BIOPSY;  Surgeon: Lynann Bologna, MD;  Location: [facility] ENDOSCOPY;  Service: Gastroenterology;;   ESOPHAGOGASTRODUODENOSCOPY (EGD) WITH PROPOFOL N/A 08/11/2020   Procedure: ESOPHAGOGASTRODUODENOSCOPY (EGD) WITH PROPOFOL;  Surgeon: Meryl Dare, MD;  Location: [facility];  Service: Endoscopy;  Laterality: N/A;   ESOPHAGOGASTRODUODENOSCOPY (EGD) WITH PROPOFOL N/A 04/02/2022   Procedure: ESOPHAGOGASTRODUODENOSCOPY (EGD) WITH PROPOFOL;  Surgeon: Lynann Bologna, MD;  Location: [facility];  Service: Gastroenterology;  Laterality: N/A;   ESOPHAGOGASTRODUODENOSCOPY (EGD) WITH PROPOFOL N/A 05/23/2022   Procedure: ESOPHAGOGASTRODUODENOSCOPY (EGD) WITH PROPOFOL;  Surgeon: Midge Minium, MD;  Location: [facility] ENDOSCOPY;  Service: Endoscopy;  Laterality: N/A;   ICD IMPLANT N/A 04/09/2022   Procedure: ICD IMPLANT;  Surgeon: [name], MD;  Location: [facility] INVASIVE CV LAB;  Service: Cardiovascular;  Laterality: N/A;   IR FLUORO GUIDE CV LINE RIGHT  10/07/2022   IR US GUIDE VASC ACCESS RIGHT  10/07/2022   treadmill stress test  01/2013   WNL,  low risk study   Social History   Occupational History   Occupation: Production designer, theatre/television/film  Tobacco Use   Smoking status: Never   Smokeless tobacco: Never  Vaping Use   Vaping status: Never Used  Substance and Sexual Activity   Alcohol use: Not Currently   Drug use: No   Sexual activity: Yes    Partners: Male    Birth control/protection: None

## 2023-02-21 DIAGNOSIS — N2581 Secondary hyperparathyroidism of renal origin: Secondary | ICD-10-CM | POA: Diagnosis not present

## 2023-02-21 DIAGNOSIS — N186 End stage renal disease: Secondary | ICD-10-CM | POA: Diagnosis not present

## 2023-02-21 DIAGNOSIS — Z992 Dependence on renal dialysis: Secondary | ICD-10-CM | POA: Diagnosis not present

## 2023-02-24 DIAGNOSIS — N186 End stage renal disease: Secondary | ICD-10-CM | POA: Diagnosis not present

## 2023-02-24 DIAGNOSIS — Z992 Dependence on renal dialysis: Secondary | ICD-10-CM | POA: Diagnosis not present

## 2023-02-24 DIAGNOSIS — N2581 Secondary hyperparathyroidism of renal origin: Secondary | ICD-10-CM | POA: Diagnosis not present

## 2023-02-25 DIAGNOSIS — E049 Nontoxic goiter, unspecified: Secondary | ICD-10-CM | POA: Diagnosis not present

## 2023-02-25 DIAGNOSIS — E041 Nontoxic single thyroid nodule: Secondary | ICD-10-CM | POA: Diagnosis not present

## 2023-02-25 DIAGNOSIS — E1140 Type 2 diabetes mellitus with diabetic neuropathy, unspecified: Secondary | ICD-10-CM | POA: Diagnosis not present

## 2023-02-25 DIAGNOSIS — Z794 Long term (current) use of insulin: Secondary | ICD-10-CM | POA: Diagnosis not present

## 2023-02-26 DIAGNOSIS — Z992 Dependence on renal dialysis: Secondary | ICD-10-CM | POA: Diagnosis not present

## 2023-02-26 DIAGNOSIS — N186 End stage renal disease: Secondary | ICD-10-CM | POA: Diagnosis not present

## 2023-02-26 DIAGNOSIS — N2581 Secondary hyperparathyroidism of renal origin: Secondary | ICD-10-CM | POA: Diagnosis not present

## 2023-02-28 DIAGNOSIS — N2581 Secondary hyperparathyroidism of renal origin: Secondary | ICD-10-CM | POA: Diagnosis not present

## 2023-02-28 DIAGNOSIS — N186 End stage renal disease: Secondary | ICD-10-CM | POA: Diagnosis not present

## 2023-02-28 DIAGNOSIS — Z992 Dependence on renal dialysis: Secondary | ICD-10-CM | POA: Diagnosis not present

## 2023-02-28 NOTE — Progress Notes (Signed)
Remote ICD transmission.   

## 2023-03-03 DIAGNOSIS — N2581 Secondary hyperparathyroidism of renal origin: Secondary | ICD-10-CM | POA: Diagnosis not present

## 2023-03-03 DIAGNOSIS — N186 End stage renal disease: Secondary | ICD-10-CM | POA: Diagnosis not present

## 2023-03-03 DIAGNOSIS — Z992 Dependence on renal dialysis: Secondary | ICD-10-CM | POA: Diagnosis not present

## 2023-03-05 DIAGNOSIS — Z992 Dependence on renal dialysis: Secondary | ICD-10-CM | POA: Diagnosis not present

## 2023-03-05 DIAGNOSIS — N186 End stage renal disease: Secondary | ICD-10-CM | POA: Diagnosis not present

## 2023-03-05 DIAGNOSIS — N2581 Secondary hyperparathyroidism of renal origin: Secondary | ICD-10-CM | POA: Diagnosis not present

## 2023-03-05 DIAGNOSIS — I129 Hypertensive chronic kidney disease with stage 1 through stage 4 chronic kidney disease, or unspecified chronic kidney disease: Secondary | ICD-10-CM | POA: Diagnosis not present

## 2023-03-07 DIAGNOSIS — N186 End stage renal disease: Secondary | ICD-10-CM | POA: Diagnosis not present

## 2023-03-07 DIAGNOSIS — N2581 Secondary hyperparathyroidism of renal origin: Secondary | ICD-10-CM | POA: Diagnosis not present

## 2023-03-07 DIAGNOSIS — Z992 Dependence on renal dialysis: Secondary | ICD-10-CM | POA: Diagnosis not present

## 2023-03-10 ENCOUNTER — Telehealth: Payer: Self-pay | Admitting: *Deleted

## 2023-03-10 DIAGNOSIS — N186 End stage renal disease: Secondary | ICD-10-CM | POA: Diagnosis not present

## 2023-03-10 DIAGNOSIS — L299 Pruritus, unspecified: Secondary | ICD-10-CM | POA: Diagnosis not present

## 2023-03-10 DIAGNOSIS — Z992 Dependence on renal dialysis: Secondary | ICD-10-CM | POA: Diagnosis not present

## 2023-03-10 DIAGNOSIS — N2581 Secondary hyperparathyroidism of renal origin: Secondary | ICD-10-CM | POA: Diagnosis not present

## 2023-03-10 NOTE — Progress Notes (Signed)
  Care Coordination Note  03/10/2023 Name: [name] MRN: [id number] DOB: Jul 11, 1971  [name] is a 52 y.o. year old female who is a primary care patient of [place], [name], [place] and is actively engaged with the care management team. I reached out to [name] by phone today to assist with re-scheduling a follow up visit with the RN Case Manager  Follow up plan: Unsuccessful telephone outreach attempt made. A HIPAA compliant phone message was left for the patient providing contact information and requesting a return call.   [facility]  Care Coordination Care Guide  Direct Dial: [phone]

## 2023-03-11 ENCOUNTER — Telehealth: Payer: Self-pay

## 2023-03-11 NOTE — Telephone Encounter (Signed)
  Renal coordinator attempted to call patient on today regarding 3 month Safe Start referral follow up. No answer from patient after multiple rings. Coordinator left confidential voicemail for patient to return call.  Will attempt to call back within 1 week.   [name] Renal Coordinator [facility] Population [facility] [phone]

## 2023-03-12 DIAGNOSIS — L299 Pruritus, unspecified: Secondary | ICD-10-CM | POA: Diagnosis not present

## 2023-03-12 DIAGNOSIS — N186 End stage renal disease: Secondary | ICD-10-CM | POA: Diagnosis not present

## 2023-03-12 DIAGNOSIS — Z992 Dependence on renal dialysis: Secondary | ICD-10-CM | POA: Diagnosis not present

## 2023-03-12 DIAGNOSIS — N2581 Secondary hyperparathyroidism of renal origin: Secondary | ICD-10-CM | POA: Diagnosis not present

## 2023-03-12 NOTE — Progress Notes (Signed)
  Care Coordination Note  03/12/2023 Name: Javiona Baich MRN: [id number] DOB: [birth year]  Karnesha Sobolewski is a 52 y.o. year old female who is a primary care patient of [place], [name], [place] and is actively engaged with the care management team. I reached out to [name] by phone today to assist with re-scheduling a follow up visit with the RN Case Manager  Follow up plan: Unsuccessful telephone outreach attempt made. A HIPAA compliant phone message was left for the patient providing contact information and requesting a return call.   [facility]  Care Coordination Care Guide  Direct Dial: [phone]

## 2023-03-13 ENCOUNTER — Encounter (HOSPITAL_COMMUNITY): Payer: Self-pay

## 2023-03-14 DIAGNOSIS — Z992 Dependence on renal dialysis: Secondary | ICD-10-CM | POA: Diagnosis not present

## 2023-03-14 DIAGNOSIS — L299 Pruritus, unspecified: Secondary | ICD-10-CM | POA: Diagnosis not present

## 2023-03-14 DIAGNOSIS — N2581 Secondary hyperparathyroidism of renal origin: Secondary | ICD-10-CM | POA: Diagnosis not present

## 2023-03-14 DIAGNOSIS — N186 End stage renal disease: Secondary | ICD-10-CM | POA: Diagnosis not present

## 2023-03-14 NOTE — Progress Notes (Signed)
  Care Coordination Note  03/14/2023 Name: [name] MRN: [id number] DOB: 04/19/1971  [name] is a 52 y.o. year old female who is a primary care patient of [place], [name], [place] and is actively engaged with the care management team. I reached out to [name] by phone today to assist with re-scheduling a follow up visit with the RN Case Manager  Follow up plan: Unsuccessful telephone outreach attempt made. A HIPAA compliant phone message was left for the patient providing contact information and requesting a return call.  We have been unable to make contact with the patient for follow up. The care management team is available to follow up with the patient after provider conversation with the patient regarding recommendation for care management engagement and subsequent re-referral to the care management team.   [facility] Coordination Care Guide  Direct Dial: [phone]

## 2023-03-17 DIAGNOSIS — Z992 Dependence on renal dialysis: Secondary | ICD-10-CM | POA: Diagnosis not present

## 2023-03-17 DIAGNOSIS — N2581 Secondary hyperparathyroidism of renal origin: Secondary | ICD-10-CM | POA: Diagnosis not present

## 2023-03-17 DIAGNOSIS — N186 End stage renal disease: Secondary | ICD-10-CM | POA: Diagnosis not present

## 2023-03-19 ENCOUNTER — Telehealth: Payer: Self-pay

## 2023-03-19 DIAGNOSIS — N2581 Secondary hyperparathyroidism of renal origin: Secondary | ICD-10-CM | POA: Diagnosis not present

## 2023-03-19 DIAGNOSIS — N186 End stage renal disease: Secondary | ICD-10-CM | POA: Diagnosis not present

## 2023-03-19 DIAGNOSIS — Z992 Dependence on renal dialysis: Secondary | ICD-10-CM | POA: Diagnosis not present

## 2023-03-19 NOTE — Telephone Encounter (Signed)
Second attempted to call patient on today regarding Safe Start referral 3 month check in. No answer from patient after multiple rings. Coordinator left confidential voicemail for patient to return call to see if she had a need for any [facility] resources.  Will call back with final attempt.  Baruch Gouty Renal Coordinator [facility] Population [facility] [phone]

## 2023-03-20 ENCOUNTER — Ambulatory Visit: Payer: BC Managed Care – PPO | Admitting: Orthopedic Surgery

## 2023-03-24 DIAGNOSIS — N2581 Secondary hyperparathyroidism of renal origin: Secondary | ICD-10-CM | POA: Diagnosis not present

## 2023-03-24 DIAGNOSIS — L299 Pruritus, unspecified: Secondary | ICD-10-CM | POA: Diagnosis not present

## 2023-03-24 DIAGNOSIS — Z23 Encounter for immunization: Secondary | ICD-10-CM | POA: Diagnosis not present

## 2023-03-24 DIAGNOSIS — Z992 Dependence on renal dialysis: Secondary | ICD-10-CM | POA: Diagnosis not present

## 2023-03-24 DIAGNOSIS — T82868A Thrombosis of vascular prosthetic devices, implants and grafts, initial encounter: Secondary | ICD-10-CM | POA: Diagnosis not present

## 2023-03-24 DIAGNOSIS — N186 End stage renal disease: Secondary | ICD-10-CM | POA: Diagnosis not present

## 2023-03-26 DIAGNOSIS — N186 End stage renal disease: Secondary | ICD-10-CM | POA: Diagnosis not present

## 2023-03-26 DIAGNOSIS — N2581 Secondary hyperparathyroidism of renal origin: Secondary | ICD-10-CM | POA: Diagnosis not present

## 2023-03-26 DIAGNOSIS — Z23 Encounter for immunization: Secondary | ICD-10-CM | POA: Diagnosis not present

## 2023-03-26 DIAGNOSIS — L299 Pruritus, unspecified: Secondary | ICD-10-CM | POA: Diagnosis not present

## 2023-03-26 DIAGNOSIS — Z992 Dependence on renal dialysis: Secondary | ICD-10-CM | POA: Diagnosis not present

## 2023-03-28 DIAGNOSIS — N186 End stage renal disease: Secondary | ICD-10-CM | POA: Diagnosis not present

## 2023-03-28 DIAGNOSIS — Z23 Encounter for immunization: Secondary | ICD-10-CM | POA: Diagnosis not present

## 2023-03-28 DIAGNOSIS — N2581 Secondary hyperparathyroidism of renal origin: Secondary | ICD-10-CM | POA: Diagnosis not present

## 2023-03-28 DIAGNOSIS — Z992 Dependence on renal dialysis: Secondary | ICD-10-CM | POA: Diagnosis not present

## 2023-03-28 DIAGNOSIS — L299 Pruritus, unspecified: Secondary | ICD-10-CM | POA: Diagnosis not present

## 2023-03-31 DIAGNOSIS — N2581 Secondary hyperparathyroidism of renal origin: Secondary | ICD-10-CM | POA: Diagnosis not present

## 2023-03-31 DIAGNOSIS — N186 End stage renal disease: Secondary | ICD-10-CM | POA: Diagnosis not present

## 2023-03-31 DIAGNOSIS — Z992 Dependence on renal dialysis: Secondary | ICD-10-CM | POA: Diagnosis not present

## 2023-04-02 DIAGNOSIS — N2581 Secondary hyperparathyroidism of renal origin: Secondary | ICD-10-CM | POA: Diagnosis not present

## 2023-04-02 DIAGNOSIS — Z992 Dependence on renal dialysis: Secondary | ICD-10-CM | POA: Diagnosis not present

## 2023-04-02 DIAGNOSIS — N186 End stage renal disease: Secondary | ICD-10-CM | POA: Diagnosis not present

## 2023-04-04 DIAGNOSIS — N186 End stage renal disease: Secondary | ICD-10-CM | POA: Diagnosis not present

## 2023-04-04 DIAGNOSIS — N2581 Secondary hyperparathyroidism of renal origin: Secondary | ICD-10-CM | POA: Diagnosis not present

## 2023-04-04 DIAGNOSIS — Z992 Dependence on renal dialysis: Secondary | ICD-10-CM | POA: Diagnosis not present

## 2023-04-05 DIAGNOSIS — N186 End stage renal disease: Secondary | ICD-10-CM | POA: Diagnosis not present

## 2023-04-05 DIAGNOSIS — Z992 Dependence on renal dialysis: Secondary | ICD-10-CM | POA: Diagnosis not present

## 2023-04-05 DIAGNOSIS — I129 Hypertensive chronic kidney disease with stage 1 through stage 4 chronic kidney disease, or unspecified chronic kidney disease: Secondary | ICD-10-CM | POA: Diagnosis not present

## 2023-04-07 DIAGNOSIS — N2581 Secondary hyperparathyroidism of renal origin: Secondary | ICD-10-CM | POA: Diagnosis not present

## 2023-04-07 DIAGNOSIS — N186 End stage renal disease: Secondary | ICD-10-CM | POA: Diagnosis not present

## 2023-04-07 DIAGNOSIS — Z992 Dependence on renal dialysis: Secondary | ICD-10-CM | POA: Diagnosis not present

## 2023-04-08 ENCOUNTER — Telehealth: Payer: Self-pay | Admitting: Internal Medicine

## 2023-04-08 NOTE — Telephone Encounter (Signed)
Pt states that she is calling to f/u on Disability paperwork that was faxed over on 8/28. Please advise

## 2023-04-08 NOTE — Telephone Encounter (Signed)
Patient called to follow-up on disability paperwork from [facility].  Form uploaded in chart under media on 04/03/23. Informed patient we request 7-14 business days to complete disability forms. Dr. Izora Ribas and his nurse are out of the office today. Patient verbalized understanding.  Will forward to Dr. Debby Bud nurse to follow-up on disability forms. Patient would like to be notified once completed.

## 2023-04-09 DIAGNOSIS — Z992 Dependence on renal dialysis: Secondary | ICD-10-CM | POA: Diagnosis not present

## 2023-04-09 DIAGNOSIS — N2581 Secondary hyperparathyroidism of renal origin: Secondary | ICD-10-CM | POA: Diagnosis not present

## 2023-04-09 DIAGNOSIS — N186 End stage renal disease: Secondary | ICD-10-CM | POA: Diagnosis not present

## 2023-04-09 NOTE — Telephone Encounter (Signed)
Called pt advised that I have faxed over OV note requested.  Confirmation page placed in nurse fax box.   Pt thanked me for following up no further concerns at this time.

## 2023-04-10 ENCOUNTER — Telehealth: Payer: Self-pay

## 2023-04-10 NOTE — Telephone Encounter (Signed)
Final attempt to call patient on today regarding Safe Start referral. No answer from patient after multiple rings. Coordinator left confidential voicemail for patient to return call.  Renal coordinator will await call back from patient.  Baruch Gouty Renal Coordinator [facility] Population Health [phone]

## 2023-04-11 ENCOUNTER — Other Ambulatory Visit (HOSPITAL_COMMUNITY): Payer: Self-pay

## 2023-04-11 ENCOUNTER — Encounter (HOSPITAL_COMMUNITY): Payer: Self-pay

## 2023-04-11 DIAGNOSIS — Z992 Dependence on renal dialysis: Secondary | ICD-10-CM | POA: Diagnosis not present

## 2023-04-11 DIAGNOSIS — N186 End stage renal disease: Secondary | ICD-10-CM | POA: Diagnosis not present

## 2023-04-11 DIAGNOSIS — N2581 Secondary hyperparathyroidism of renal origin: Secondary | ICD-10-CM | POA: Diagnosis not present

## 2023-04-11 MED ORDER — FREESTYLE LIBRE 3 SENSOR MISC
4 refills | Status: DC
  Filled 2023-04-11: qty 2, 28d supply, fill #0

## 2023-04-11 MED ORDER — FREESTYLE LIBRE 14 DAY SENSOR MISC
4 refills | Status: DC
  Filled 2023-04-11: qty 2, 28d supply, fill #0

## 2023-04-14 DIAGNOSIS — N2581 Secondary hyperparathyroidism of renal origin: Secondary | ICD-10-CM | POA: Diagnosis not present

## 2023-04-14 DIAGNOSIS — R09A2 Foreign body sensation, throat: Secondary | ICD-10-CM | POA: Diagnosis not present

## 2023-04-14 DIAGNOSIS — Z992 Dependence on renal dialysis: Secondary | ICD-10-CM | POA: Diagnosis not present

## 2023-04-14 DIAGNOSIS — N186 End stage renal disease: Secondary | ICD-10-CM | POA: Diagnosis not present

## 2023-04-14 DIAGNOSIS — K219 Gastro-esophageal reflux disease without esophagitis: Secondary | ICD-10-CM | POA: Diagnosis not present

## 2023-04-14 DIAGNOSIS — R1114 Bilious vomiting: Secondary | ICD-10-CM | POA: Diagnosis not present

## 2023-04-15 ENCOUNTER — Encounter (HOSPITAL_COMMUNITY): Payer: Self-pay

## 2023-04-15 ENCOUNTER — Other Ambulatory Visit (HOSPITAL_COMMUNITY): Payer: Self-pay

## 2023-04-16 DIAGNOSIS — N2581 Secondary hyperparathyroidism of renal origin: Secondary | ICD-10-CM | POA: Diagnosis not present

## 2023-04-16 DIAGNOSIS — N186 End stage renal disease: Secondary | ICD-10-CM | POA: Diagnosis not present

## 2023-04-16 DIAGNOSIS — Z992 Dependence on renal dialysis: Secondary | ICD-10-CM | POA: Diagnosis not present

## 2023-04-18 DIAGNOSIS — N186 End stage renal disease: Secondary | ICD-10-CM | POA: Diagnosis not present

## 2023-04-18 DIAGNOSIS — Z992 Dependence on renal dialysis: Secondary | ICD-10-CM | POA: Diagnosis not present

## 2023-04-18 DIAGNOSIS — N2581 Secondary hyperparathyroidism of renal origin: Secondary | ICD-10-CM | POA: Diagnosis not present

## 2023-04-21 DIAGNOSIS — N186 End stage renal disease: Secondary | ICD-10-CM | POA: Diagnosis not present

## 2023-04-21 DIAGNOSIS — L299 Pruritus, unspecified: Secondary | ICD-10-CM | POA: Diagnosis not present

## 2023-04-21 DIAGNOSIS — N2581 Secondary hyperparathyroidism of renal origin: Secondary | ICD-10-CM | POA: Diagnosis not present

## 2023-04-21 DIAGNOSIS — Z992 Dependence on renal dialysis: Secondary | ICD-10-CM | POA: Diagnosis not present

## 2023-04-22 ENCOUNTER — Other Ambulatory Visit (HOSPITAL_COMMUNITY): Payer: Self-pay

## 2023-04-23 DIAGNOSIS — N2581 Secondary hyperparathyroidism of renal origin: Secondary | ICD-10-CM | POA: Diagnosis not present

## 2023-04-23 DIAGNOSIS — Z992 Dependence on renal dialysis: Secondary | ICD-10-CM | POA: Diagnosis not present

## 2023-04-23 DIAGNOSIS — L299 Pruritus, unspecified: Secondary | ICD-10-CM | POA: Diagnosis not present

## 2023-04-23 DIAGNOSIS — N186 End stage renal disease: Secondary | ICD-10-CM | POA: Diagnosis not present

## 2023-04-25 DIAGNOSIS — L299 Pruritus, unspecified: Secondary | ICD-10-CM | POA: Diagnosis not present

## 2023-04-25 DIAGNOSIS — N186 End stage renal disease: Secondary | ICD-10-CM | POA: Diagnosis not present

## 2023-04-25 DIAGNOSIS — N2581 Secondary hyperparathyroidism of renal origin: Secondary | ICD-10-CM | POA: Diagnosis not present

## 2023-04-25 DIAGNOSIS — Z992 Dependence on renal dialysis: Secondary | ICD-10-CM | POA: Diagnosis not present

## 2023-04-28 DIAGNOSIS — N2581 Secondary hyperparathyroidism of renal origin: Secondary | ICD-10-CM | POA: Diagnosis not present

## 2023-04-28 DIAGNOSIS — N186 End stage renal disease: Secondary | ICD-10-CM | POA: Diagnosis not present

## 2023-04-28 DIAGNOSIS — Z992 Dependence on renal dialysis: Secondary | ICD-10-CM | POA: Diagnosis not present

## 2023-04-28 DIAGNOSIS — L299 Pruritus, unspecified: Secondary | ICD-10-CM | POA: Diagnosis not present

## 2023-04-29 DIAGNOSIS — I160 Hypertensive urgency: Secondary | ICD-10-CM | POA: Diagnosis not present

## 2023-04-29 DIAGNOSIS — H35033 Hypertensive retinopathy, bilateral: Secondary | ICD-10-CM | POA: Diagnosis not present

## 2023-04-29 DIAGNOSIS — R112 Nausea with vomiting, unspecified: Secondary | ICD-10-CM | POA: Diagnosis not present

## 2023-04-29 DIAGNOSIS — H43823 Vitreomacular adhesion, bilateral: Secondary | ICD-10-CM | POA: Diagnosis not present

## 2023-04-29 DIAGNOSIS — K920 Hematemesis: Secondary | ICD-10-CM | POA: Diagnosis not present

## 2023-04-29 DIAGNOSIS — E113513 Type 2 diabetes mellitus with proliferative diabetic retinopathy with macular edema, bilateral: Secondary | ICD-10-CM | POA: Diagnosis not present

## 2023-04-29 DIAGNOSIS — I10 Essential (primary) hypertension: Secondary | ICD-10-CM | POA: Diagnosis not present

## 2023-04-29 DIAGNOSIS — H2513 Age-related nuclear cataract, bilateral: Secondary | ICD-10-CM | POA: Diagnosis not present

## 2023-04-29 DIAGNOSIS — E1165 Type 2 diabetes mellitus with hyperglycemia: Secondary | ICD-10-CM | POA: Diagnosis not present

## 2023-04-29 DIAGNOSIS — R000 Tachycardia, unspecified: Secondary | ICD-10-CM | POA: Diagnosis not present

## 2023-04-29 DIAGNOSIS — R739 Hyperglycemia, unspecified: Secondary | ICD-10-CM | POA: Diagnosis not present

## 2023-04-30 ENCOUNTER — Observation Stay (HOSPITAL_COMMUNITY)
Admission: EM | Admit: 2023-04-30 | Discharge: 2023-05-01 | Disposition: A | Discharge status: Home / Self Care | Payer: BC Managed Care – PPO | Attending: Internal Medicine | Admitting: Internal Medicine

## 2023-04-30 ENCOUNTER — Other Ambulatory Visit: Payer: Self-pay

## 2023-04-30 ENCOUNTER — Encounter (HOSPITAL_COMMUNITY): Payer: Self-pay

## 2023-04-30 ENCOUNTER — Encounter (HOSPITAL_COMMUNITY): Payer: Self-pay | Admitting: Family Medicine

## 2023-04-30 ENCOUNTER — Emergency Department (HOSPITAL_COMMUNITY): Payer: BC Managed Care – PPO

## 2023-04-30 DIAGNOSIS — Z79899 Other long term (current) drug therapy: Secondary | ICD-10-CM | POA: Insufficient documentation

## 2023-04-30 DIAGNOSIS — E1043 Type 1 diabetes mellitus with diabetic autonomic (poly)neuropathy: Secondary | ICD-10-CM | POA: Insufficient documentation

## 2023-04-30 DIAGNOSIS — E43 Unspecified severe protein-calorie malnutrition: Secondary | ICD-10-CM | POA: Diagnosis present

## 2023-04-30 DIAGNOSIS — I5042 Chronic combined systolic (congestive) and diastolic (congestive) heart failure: Secondary | ICD-10-CM | POA: Diagnosis not present

## 2023-04-30 DIAGNOSIS — Z95810 Presence of automatic (implantable) cardiac defibrillator: Secondary | ICD-10-CM | POA: Diagnosis not present

## 2023-04-30 DIAGNOSIS — J45909 Unspecified asthma, uncomplicated: Secondary | ICD-10-CM | POA: Diagnosis not present

## 2023-04-30 DIAGNOSIS — E871 Hypo-osmolality and hyponatremia: Secondary | ICD-10-CM

## 2023-04-30 DIAGNOSIS — E1010 Type 1 diabetes mellitus with ketoacidosis without coma: Secondary | ICD-10-CM | POA: Diagnosis not present

## 2023-04-30 DIAGNOSIS — D649 Anemia, unspecified: Secondary | ICD-10-CM | POA: Diagnosis not present

## 2023-04-30 DIAGNOSIS — E1165 Type 2 diabetes mellitus with hyperglycemia: Secondary | ICD-10-CM | POA: Diagnosis not present

## 2023-04-30 DIAGNOSIS — N186 End stage renal disease: Secondary | ICD-10-CM | POA: Diagnosis not present

## 2023-04-30 DIAGNOSIS — Z794 Long term (current) use of insulin: Secondary | ICD-10-CM | POA: Insufficient documentation

## 2023-04-30 DIAGNOSIS — E1069 Type 1 diabetes mellitus with other specified complication: Secondary | ICD-10-CM | POA: Diagnosis not present

## 2023-04-30 DIAGNOSIS — I132 Hypertensive heart and chronic kidney disease with heart failure and with stage 5 chronic kidney disease, or end stage renal disease: Secondary | ICD-10-CM | POA: Insufficient documentation

## 2023-04-30 DIAGNOSIS — R112 Nausea with vomiting, unspecified: Principal | ICD-10-CM | POA: Diagnosis present

## 2023-04-30 DIAGNOSIS — E872 Acidosis, unspecified: Secondary | ICD-10-CM | POA: Insufficient documentation

## 2023-04-30 DIAGNOSIS — Z7982 Long term (current) use of aspirin: Secondary | ICD-10-CM | POA: Diagnosis not present

## 2023-04-30 DIAGNOSIS — I2510 Atherosclerotic heart disease of native coronary artery without angina pectoris: Secondary | ICD-10-CM | POA: Insufficient documentation

## 2023-04-30 DIAGNOSIS — Z992 Dependence on renal dialysis: Secondary | ICD-10-CM | POA: Insufficient documentation

## 2023-04-30 DIAGNOSIS — I160 Hypertensive urgency: Secondary | ICD-10-CM | POA: Diagnosis present

## 2023-04-30 DIAGNOSIS — E1022 Type 1 diabetes mellitus with diabetic chronic kidney disease: Secondary | ICD-10-CM | POA: Diagnosis not present

## 2023-04-30 DIAGNOSIS — K920 Hematemesis: Secondary | ICD-10-CM | POA: Diagnosis not present

## 2023-04-30 DIAGNOSIS — R109 Unspecified abdominal pain: Secondary | ICD-10-CM | POA: Diagnosis not present

## 2023-04-30 DIAGNOSIS — I10 Essential (primary) hypertension: Secondary | ICD-10-CM | POA: Insufficient documentation

## 2023-04-30 DIAGNOSIS — E1065 Type 1 diabetes mellitus with hyperglycemia: Principal | ICD-10-CM | POA: Insufficient documentation

## 2023-04-30 DIAGNOSIS — E782 Mixed hyperlipidemia: Secondary | ICD-10-CM | POA: Diagnosis present

## 2023-04-30 DIAGNOSIS — Z8719 Personal history of other diseases of the digestive system: Secondary | ICD-10-CM

## 2023-04-30 DIAGNOSIS — R739 Hyperglycemia, unspecified: Secondary | ICD-10-CM

## 2023-04-30 DIAGNOSIS — R1110 Vomiting, unspecified: Secondary | ICD-10-CM | POA: Diagnosis not present

## 2023-04-30 DIAGNOSIS — E870 Hyperosmolality and hypernatremia: Secondary | ICD-10-CM

## 2023-04-30 DIAGNOSIS — E1143 Type 2 diabetes mellitus with diabetic autonomic (poly)neuropathy: Secondary | ICD-10-CM

## 2023-04-30 LAB — I-STAT CHEM 8, ED
BUN: 35 mg/dL — ABNORMAL HIGH (ref 6–20)
Calcium, Ion: 1.2 mmol/L (ref 1.15–1.40)
Chloride: 98 mmol/L (ref 98–111)
Creatinine, Ser: 5.1 mg/dL — ABNORMAL HIGH (ref 0.44–1.00)
Glucose, Bld: >700 mg/dL (ref 70–99)
HCT: 41 % (ref 36.0–46.0)
Hemoglobin: 13.9 g/dL (ref 12.0–15.0)
Potassium: 3.8 mmol/L (ref 3.5–5.1)
Sodium: 133 mmol/L — ABNORMAL LOW (ref 135–145)
TCO2: 22 mmol/L (ref 22–32)

## 2023-04-30 LAB — BASIC METABOLIC PANEL
Anion gap: 11 (ref 5–15)
BUN: 32 mg/dL — ABNORMAL HIGH (ref 6–20)
CO2: 23 mmol/L (ref 22–32)
Calcium: 8.8 mg/dL — ABNORMAL LOW (ref 8.9–10.3)
Chloride: 103 mmol/L (ref 98–111)
Creatinine, Ser: 5.25 mg/dL — ABNORMAL HIGH (ref 0.44–1.00)
GFR, Estimated: 9 mL/min — ABNORMAL LOW (ref >60)
Glucose, Bld: 309 mg/dL — ABNORMAL HIGH (ref 70–99)
Potassium: 3.4 mmol/L — ABNORMAL LOW (ref 3.5–5.1)
Sodium: 137 mmol/L (ref 135–145)

## 2023-04-30 LAB — CBC WITH DIFFERENTIAL/PLATELET
Abs Immature Granulocytes: 0.02 10*3/uL (ref 0.00–0.07)
Basophils Absolute: 0 10*3/uL (ref 0.0–0.1)
Basophils Relative: 0 %
Eosinophils Absolute: 0.1 10*3/uL (ref 0.0–0.5)
Eosinophils Relative: 1 %
HCT: 37 % (ref 36.0–46.0)
Hemoglobin: 12.4 g/dL (ref 12.0–15.0)
Immature Granulocytes: 0 %
Lymphocytes Relative: 11 %
Lymphs Abs: 0.8 10*3/uL (ref 0.7–4.0)
MCH: 30 pg (ref 26.0–34.0)
MCHC: 33.5 g/dL (ref 30.0–36.0)
MCV: 89.4 fL (ref 80.0–100.0)
Monocytes Absolute: 0.3 10*3/uL (ref 0.1–1.0)
Monocytes Relative: 4 %
Neutro Abs: 6.4 10*3/uL (ref 1.7–7.7)
Neutrophils Relative %: 84 %
Platelets: 153 10*3/uL (ref 150–400)
RBC: 4.14 MIL/uL (ref 3.87–5.11)
RDW: 16.1 % — ABNORMAL HIGH (ref 11.5–15.5)
WBC: 7.7 10*3/uL (ref 4.0–10.5)
nRBC: 0 % (ref 0.0–0.2)

## 2023-04-30 LAB — I-STAT VENOUS BLOOD GAS, ED
Acid-Base Excess: 3 mmol/L — ABNORMAL HIGH (ref 0.0–2.0)
Bicarbonate: 26.9 mmol/L (ref 20.0–28.0)
Calcium, Ion: 1.09 mmol/L — ABNORMAL LOW (ref 1.15–1.40)
HCT: 38 % (ref 36.0–46.0)
Hemoglobin: 12.9 g/dL (ref 12.0–15.0)
O2 Saturation: 92 %
Potassium: 5.7 mmol/L — ABNORMAL HIGH (ref 3.5–5.1)
Sodium: 130 mmol/L — ABNORMAL LOW (ref 135–145)
TCO2: 28 mmol/L (ref 22–32)
pCO2, Ven: 39.1 mmHg — ABNORMAL LOW (ref 44–60)
pH, Ven: 7.446 — ABNORMAL HIGH (ref 7.25–7.43)
pO2, Ven: 61 mmHg — ABNORMAL HIGH (ref 32–45)

## 2023-04-30 LAB — TROPONIN I (HIGH SENSITIVITY)
Troponin I (High Sensitivity): 137 ng/L (ref <18)
Troponin I (High Sensitivity): 89 ng/L — ABNORMAL HIGH (ref <18)

## 2023-04-30 LAB — CBG MONITORING, ED
Glucose-Capillary: >600 mg/dL (ref 70–99)
Glucose-Capillary: >600 mg/dL (ref 70–99)
Glucose-Capillary: >600 mg/dL (ref 70–99)
Glucose-Capillary: 549 mg/dL (ref 70–99)
Glucose-Capillary: 515 mg/dL (ref 70–99)
Glucose-Capillary: 360 mg/dL — ABNORMAL HIGH (ref 70–99)
Glucose-Capillary: 439 mg/dL — ABNORMAL HIGH (ref 70–99)
Glucose-Capillary: 296 mg/dL — ABNORMAL HIGH (ref 70–99)
Glucose-Capillary: 217 mg/dL — ABNORMAL HIGH (ref 70–99)
Glucose-Capillary: 201 mg/dL — ABNORMAL HIGH (ref 70–99)
Glucose-Capillary: 160 mg/dL — ABNORMAL HIGH (ref 70–99)
Glucose-Capillary: 224 mg/dL — ABNORMAL HIGH (ref 70–99)

## 2023-04-30 LAB — HEMOGLOBIN A1C
Hgb A1c MFr Bld: 11.4 % — ABNORMAL HIGH (ref 4.8–5.6)
Hgb A1c MFr Bld: 11.7 % — ABNORMAL HIGH (ref 4.8–5.6)
Mean Plasma Glucose: 280.48 mg/dL
Mean Plasma Glucose: 289.09 mg/dL

## 2023-04-30 LAB — PROTIME-INR
INR: 1 (ref 0.8–1.2)
INR: 1.2 (ref 0.8–1.2)
Prothrombin Time: 13.2 seconds (ref 11.4–15.2)
Prothrombin Time: 15.5 seconds — ABNORMAL HIGH (ref 11.4–15.2)

## 2023-04-30 LAB — LIPASE, BLOOD: Lipase: 27 U/L (ref 11–51)

## 2023-04-30 LAB — COMPREHENSIVE METABOLIC PANEL
ALT: 12 U/L (ref 0–44)
AST: 13 U/L — ABNORMAL LOW (ref 15–41)
Albumin: 3.6 g/dL (ref 3.5–5.0)
Alkaline Phosphatase: 92 U/L (ref 38–126)
Anion gap: 17 — ABNORMAL HIGH (ref 5–15)
BUN: 34 mg/dL — ABNORMAL HIGH (ref 6–20)
CO2: 21 mmol/L — ABNORMAL LOW (ref 22–32)
Calcium: 9.5 mg/dL (ref 8.9–10.3)
Chloride: 92 mmol/L — ABNORMAL LOW (ref 98–111)
Creatinine, Ser: 4.97 mg/dL — ABNORMAL HIGH (ref 0.44–1.00)
GFR, Estimated: 10 mL/min — ABNORMAL LOW (ref >60)
Glucose, Bld: 875 mg/dL (ref 70–99)
Potassium: 3.7 mmol/L (ref 3.5–5.1)
Sodium: 130 mmol/L — ABNORMAL LOW (ref 135–145)
Total Bilirubin: 2.1 mg/dL — ABNORMAL HIGH (ref 0.3–1.2)
Total Protein: 7.7 g/dL (ref 6.5–8.1)

## 2023-04-30 LAB — GLUCOSE, CAPILLARY
Glucose-Capillary: 351 mg/dL — ABNORMAL HIGH (ref 70–99)
Glucose-Capillary: 238 mg/dL — ABNORMAL HIGH (ref 70–99)

## 2023-04-30 LAB — BETA-HYDROXYBUTYRIC ACID: Beta-Hydroxybutyric Acid: 2.24 mmol/L — ABNORMAL HIGH (ref 0.05–0.27)

## 2023-04-30 LAB — TYPE AND SCREEN
ABO/RH(D): A POS
Antibody Screen: NEGATIVE

## 2023-04-30 LAB — MAGNESIUM: Magnesium: 1.9 mg/dL (ref 1.7–2.4)

## 2023-04-30 LAB — HEMOGLOBIN AND HEMATOCRIT, BLOOD
HCT: 32.7 % — ABNORMAL LOW (ref 36.0–46.0)
Hemoglobin: 10.8 g/dL — ABNORMAL LOW (ref 12.0–15.0)

## 2023-04-30 LAB — HEPATITIS B SURFACE ANTIGEN: Hepatitis B Surface Ag: NONREACTIVE

## 2023-04-30 LAB — LACTIC ACID, PLASMA
Lactic Acid, Venous: 1.5 mmol/L (ref 0.5–1.9)
Lactic Acid, Venous: 4.2 mmol/L (ref 0.5–1.9)

## 2023-04-30 LAB — AMMONIA: Ammonia: 18 umol/L (ref 9–35)

## 2023-04-30 LAB — PHOSPHORUS: Phosphorus: 3.8 mg/dL (ref 2.5–4.6)

## 2023-04-30 MED ORDER — PANTOPRAZOLE 80MG IVPB - SIMPLE MED
80.0000 mg | Freq: Once | INTRAVENOUS | Status: AC
  Administered 2023-04-30: 80 mg via INTRAVENOUS
  Filled 2023-04-30: qty 100

## 2023-04-30 MED ORDER — INSULIN GLARGINE-YFGN 100 UNIT/ML ~~LOC~~ SOLN
20.0000 [IU] | Freq: Every day | SUBCUTANEOUS | Status: DC
  Administered 2023-04-30 – 2023-05-01 (×2): 20 [IU] via SUBCUTANEOUS
  Filled 2023-04-30 (×2): qty 0.2

## 2023-04-30 MED ORDER — CHLORHEXIDINE GLUCONATE CLOTH 2 % EX PADS
6.0000 | MEDICATED_PAD | Freq: Every day | CUTANEOUS | Status: DC
  Administered 2023-05-01: 6 via TOPICAL

## 2023-04-30 MED ORDER — SCOPOLAMINE 1 MG/3DAYS TD PT72
1.0000 | MEDICATED_PATCH | TRANSDERMAL | Status: DC
  Administered 2023-04-30: 1.5 mg via TRANSDERMAL
  Filled 2023-04-30: qty 1

## 2023-04-30 MED ORDER — LACTATED RINGERS IV BOLUS
1000.0000 mL | Freq: Once | INTRAVENOUS | Status: AC
  Administered 2023-04-30: 1000 mL via INTRAVENOUS

## 2023-04-30 MED ORDER — INSULIN ASPART 100 UNIT/ML IJ SOLN
0.0000 [IU] | Freq: Three times a day (TID) | INTRAMUSCULAR | Status: DC
  Administered 2023-04-30: 5 [IU] via SUBCUTANEOUS
  Administered 2023-04-30: 15 [IU] via SUBCUTANEOUS
  Administered 2023-05-01: 3 [IU] via SUBCUTANEOUS
  Administered 2023-05-01: 8 [IU] via SUBCUTANEOUS

## 2023-04-30 MED ORDER — INSULIN ASPART 100 UNIT/ML IJ SOLN
0.0000 [IU] | Freq: Every day | INTRAMUSCULAR | Status: DC
  Administered 2023-04-30: 2 [IU] via SUBCUTANEOUS

## 2023-04-30 MED ORDER — HYDRALAZINE HCL 20 MG/ML IJ SOLN
10.0000 mg | INTRAMUSCULAR | Status: DC | PRN
  Administered 2023-04-30: 10 mg via INTRAVENOUS
  Filled 2023-04-30: qty 1

## 2023-04-30 MED ORDER — ACETAMINOPHEN 325 MG PO TABS
650.0000 mg | ORAL_TABLET | Freq: Four times a day (QID) | ORAL | Status: DC | PRN
  Administered 2023-05-01: 650 mg via ORAL
  Filled 2023-04-30: qty 2

## 2023-04-30 MED ORDER — ACETAMINOPHEN 650 MG RE SUPP: 650.0000 mg | Freq: Four times a day (QID) | RECTAL | Status: DC | PRN

## 2023-04-30 MED ORDER — DEXTROSE 50 % IV SOLN: 0.0000 mL | INTRAVENOUS | Status: DC | PRN

## 2023-04-30 MED ORDER — HYDRALAZINE HCL 50 MG PO TABS
75.0000 mg | ORAL_TABLET | Freq: Three times a day (TID) | ORAL | Status: DC
  Administered 2023-04-30 – 2023-05-01 (×2): 75 mg via ORAL
  Filled 2023-04-30: qty 1
  Filled 2023-04-30: qty 3

## 2023-04-30 MED ORDER — LORAZEPAM 2 MG/ML IJ SOLN
1.0000 mg | Freq: Once | INTRAMUSCULAR | Status: AC
  Administered 2023-04-30: 1 mg via INTRAVENOUS
  Filled 2023-04-30: qty 1

## 2023-04-30 MED ORDER — AMLODIPINE BESYLATE 10 MG PO TABS
10.0000 mg | ORAL_TABLET | Freq: Every day | ORAL | Status: DC
  Administered 2023-04-30 – 2023-05-01 (×2): 10 mg via ORAL
  Filled 2023-04-30: qty 1
  Filled 2023-04-30: qty 2

## 2023-04-30 MED ORDER — DROPERIDOL 2.5 MG/ML IJ SOLN
2.5000 mg | Freq: Once | INTRAMUSCULAR | Status: AC
  Administered 2023-04-30: 2.5 mg via INTRAVENOUS
  Filled 2023-04-30: qty 2

## 2023-04-30 MED ORDER — HYDRALAZINE HCL 20 MG/ML IJ SOLN: 5.0000 mg | INTRAMUSCULAR | Status: DC | PRN

## 2023-04-30 MED ORDER — DEXTROSE IN LACTATED RINGERS 5 % IV SOLN: INTRAVENOUS | Status: DC

## 2023-04-30 MED ORDER — SODIUM CHLORIDE 0.9 % IV SOLN
12.5000 mg | Freq: Once | INTRAVENOUS | Status: AC
  Administered 2023-04-30: 12.5 mg via INTRAVENOUS
  Filled 2023-04-30: qty 12.5

## 2023-04-30 MED ORDER — ISOSORBIDE DINITRATE 10 MG PO TABS
20.0000 mg | ORAL_TABLET | Freq: Every day | ORAL | Status: DC
  Administered 2023-04-30 – 2023-05-01 (×2): 20 mg via ORAL
  Filled 2023-04-30 (×2): qty 2

## 2023-04-30 MED ORDER — PANTOPRAZOLE SODIUM 40 MG IV SOLR: 40.0000 mg | Freq: Two times a day (BID) | INTRAVENOUS | Status: DC

## 2023-04-30 MED ORDER — PROCHLORPERAZINE EDISYLATE 10 MG/2ML IJ SOLN
10.0000 mg | Freq: Once | INTRAMUSCULAR | Status: AC
  Administered 2023-04-30: 10 mg via INTRAVENOUS
  Filled 2023-04-30: qty 2

## 2023-04-30 MED ORDER — HYDRALAZINE HCL 20 MG/ML IJ SOLN
10.0000 mg | Freq: Once | INTRAMUSCULAR | Status: AC
  Administered 2023-04-30: 10 mg via INTRAVENOUS
  Filled 2023-04-30: qty 1

## 2023-04-30 MED ORDER — PANTOPRAZOLE INFUSION (NEW) - SIMPLE MED
8.0000 mg/h | INTRAVENOUS | Status: DC
  Administered 2023-04-30 – 2023-05-01 (×2): 8 mg/h via INTRAVENOUS
  Filled 2023-04-30 (×5): qty 100

## 2023-04-30 MED ORDER — LACTATED RINGERS IV SOLN: INTRAVENOUS | Status: DC

## 2023-04-30 MED ORDER — INSULIN REGULAR(HUMAN) IN NACL 100-0.9 UT/100ML-% IV SOLN
INTRAVENOUS | Status: DC
  Administered 2023-04-30: 6.5 [IU]/h via INTRAVENOUS
  Filled 2023-04-30: qty 100

## 2023-04-30 MED ORDER — METOPROLOL SUCCINATE ER 50 MG PO TB24
50.0000 mg | ORAL_TABLET | Freq: Every day | ORAL | Status: DC
  Administered 2023-04-30 – 2023-05-01 (×2): 50 mg via ORAL
  Filled 2023-04-30: qty 2
  Filled 2023-04-30: qty 1

## 2023-04-30 MED ORDER — LACTATED RINGERS IV SOLN: INTRAVENOUS | Status: AC

## 2023-04-30 NOTE — H&P (Addendum)
PCP:   Delma Officer, PA   Chief Complaint:  Coffee ground emesis  HPI: This is a 52 y.o. female with medical history significant of T1DM,  HTN, gastroparesis, hyperlipidemia, diastolic CHF, mood disorder, anemia, ESRD on HD, CAD. The patient presents w/ complaint off abdominal pain, intractable nausea and vomiting for 48hrs. She has not been able to tolerate her oral meds and has not taken her BP meds in the last 2 days. She turned off her insulin pump because she was not having PO intake. Today she developed coffee ground emesis and epigastric pain. She came to the ER     On presentation to the ER patients BP 209/104, 104, 21, afebrile. Serum glucose 875, AG 17. Ph 7.44, LA 1.5 => 4.2, sodium 130, beta-hydroxybutyric acid 2.2. Patient given 10mg  IV hydralazine BP 175/166. 2L LR bolus, insulin drip initiated.  Patient given IV compazine, droperidol, phenergan, and ativan given. IV protonix bolus and drip initiated. Hgb 12.4 and stable. CXR, KUB none acute  Review of Systems:  Per HPI  Past Medical History: Past Medical History:  Diagnosis Date   AICD (automatic cardioverter/defibrillator) present    [employer]   Anemia    Anxiety    Arthritis    Asthma    CHF (congestive heart failure) (HCC)    Chronic diastolic CHF (congestive heart failure) (HCC) 12/14/2019   Depression    Diabetic ulcer of left foot (HCC) 05/12/2013   ESRD on hemodialysis (HCC)    MWF at [facility]   Gastroparesis    Generalized abdominal pain    History of chicken pox    Loss of weight 12/02/2019   Migraines    Mixed hyperlipidemia 09/05/2022   Mood disorder (HCC)    anxiety   Myocardial infarction (HCC)    Prurigo nodularis    with diabetic dermopathy   Type 1 diabetes mellitus (HCC) 05/23/2022   Type 1 diabetes, uncontrolled, with neuropathy    [place]   Ulcers of both lower legs (HCC) 02/20/2014   Past Surgical History:  Procedure Laterality Date   AV FISTULA PLACEMENT Left 10/01/2022    Procedure: ARTERIOVENOUS (AV)FISTULA CREATION;  Surgeon: Larina Earthly, MD;  Location: [facility];  Service: Vascular;  Laterality: Left;   AV FISTULA PLACEMENT Right 11/19/2022   Procedure: INSERTION OF RIGHT ARM ARTERIOVENOUS (AV) GORE-TEX GRAFT;  Surgeon: Chuck Hint, MD;  Location: [facility] OR;  Service: Vascular;  Laterality: Right;   BIOPSY  08/11/2020   Procedure: BIOPSY;  Surgeon: Meryl Dare, MD;  Location: [facility] ENDOSCOPY;  Service: Endoscopy;;   BIOPSY  04/02/2022   Procedure: BIOPSY;  Surgeon: Lynann Bologna, MD;  Location: [facility] ENDOSCOPY;  Service: Gastroenterology;;   ESOPHAGOGASTRODUODENOSCOPY (EGD) WITH PROPOFOL N/A 08/11/2020   Procedure: ESOPHAGOGASTRODUODENOSCOPY (EGD) WITH PROPOFOL;  Surgeon: Meryl Dare, MD;  Location: [facility] ENDOSCOPY;  Service: Endoscopy;  Laterality: N/A;   ESOPHAGOGASTRODUODENOSCOPY (EGD) WITH PROPOFOL N/A 04/02/2022   Procedure: ESOPHAGOGASTRODUODENOSCOPY (EGD) WITH PROPOFOL;  Surgeon: Lynann Bologna, MD;  Location: [facility] ENDOS[facility];  Service: Gastroenterology;  Laterality: N/A;   ESOPHAGOGASTRODUODENOSCOPY (EGD) WITH PROPOFOL N/A 05/23/2022   Procedure: ESOPHAGOGASTRODUODENOSCOPY (EGD) WITH PROPOFOL;  Surgeon: Midge Minium, MD;  Location: [facility] ENDOSCOPY;  Service: Endoscopy;  Laterality: N/A;   ICD IMPLANT N/A 04/09/2022   Procedure: ICD IMPLANT;  Surgeon: [name], MD;  Location: [facility] INVASIVE CV LAB;  Service: Cardiovascular;  Laterality: N/A;   IR FLUORO GUIDE CV LINE RIGHT  10/07/2022   IR US GUIDE VASC ACCESS RIGHT  10/07/2022   treadmill stress test  01/2013   WNL, low risk study    Medications: Prior to Admission medications   Medication Sig Start Date End Date Taking? Authorizing Provider  acetaminophen (TYLENOL) 500 MG tablet Take 1,000 mg by mouth every 8 (eight) hours as needed for moderate pain or mild pain.    [provider]  albuterol (PROAIR HFA) 108 (90 Base) MCG/ACT inhaler Inhale 2 puffs into the lungs every 6 (six) hours as  needed for wheezing or shortness of breath. 07/03/20   Waldon Merl, PA-C  aspirin EC 81 MG tablet Take 1 tablet (81 mg total) by mouth daily. Swallow whole. 10/16/22   Hughie Closs, MD  Continuous Glucose Sensor (FREESTYLE LIBRE 3 SENSOR) MISC Change sensor every 14 days as directed 10/15/22   Talmage Coin, MD  ezetimibe (ZETIA) 10 MG tablet Take 1 tablet (10 mg total) by mouth daily. 12/09/22   Chandrasekhar, Rondel Jumbo, MD  FIASP 100 UNIT/ML SOLN 120 Units daily at 6 (six) AM. 12/03/22   [provider]  hydrALAZINE (APRESOLINE) 50 MG tablet Take 1.5 tablets (75 mg total) by mouth 3 (three) times daily. 12/10/22   Christell Constant, MD  hydrOXYzine (ATARAX) 50 MG tablet Take 50 mg by mouth 3 (three) times daily.    [provider]  insulin aspart (NOVOLOG) 100 UNIT/ML injection Inject 120 Units into the skin See admin instructions. Patient uses this with the Omnipod insulin pump  its vary according to blood sugar. Loads 120 units every 3 days.    [provider]  LORazepam (ATIVAN) 0.5 MG tablet Take 1 tablet (0.5 mg total) by mouth every 8 (eight) hours as needed for anxiety (Refractory nausea and vomiting). 07/17/22   Zannie Cove, MD  Methoxy PEG-Epoetin Beta (MIRCERA IJ) as directed. Kidney clinic 11/27/22 11/26/23  [provider]  metoCLOPramide (REGLAN) 5 MG tablet Take 5 mg by mouth every 8 (eight) hours as needed.    [provider]  metoprolol succinate (TOPROL-XL) 50 MG 24 hr tablet Take 1 tablet (50 mg total) by mouth daily. Take with or immediately following a meal. 12/10/22   Chandrasekhar, Mahesh A, MD  nitroGLYCERIN (NITROSTAT) 0.4 MG SL tablet Place 1 tablet (0.4 mg total) under the tongue every 5 (five) minutes as needed for chest pain. 09/05/22 12/10/22  Christell Constant, MD  pantoprazole (PROTONIX) 40 MG tablet Take 1 tablet (40 mg total) by mouth 2 (two) times daily before a meal. Patient taking differently: Take 40 mg by mouth  2 (two) times daily as needed (heartburn). 05/24/22 12/10/22  Rai, Ripudeep K, MD  polyethylene glycol (MIRALAX / GLYCOLAX) 17 g packet Take 17 g by mouth daily as needed for moderate constipation.    [provider]  pravastatin (PRAVACHOL) 40 MG tablet Take 1 tablet (40 mg total) by mouth every evening. 12/09/22   Chandrasekhar, Mahesh A, MD  pregabalin (LYRICA) 50 MG capsule Take 50 mg by mouth daily as needed (neuropathy). 12/05/21   [provider]  prochlorperazine (COMPAZINE) 10 MG tablet Take 1 tablet (10 mg total) by mouth every 6 (six) hours as needed for up to 7 days for nausea or vomiting. 11/05/22 12/10/22  Sherryll Burger, Pratik D, DO  promethazine (PHENERGAN) 25 MG suppository Unwrap and place 1 suppository (25 mg total) rectally every 6 (six) hours as needed for nausea or vomiting. 11/05/22   Sherryll Burger, Pratik D, DO  scopolamine (TRANSDERM-SCOP) 1 MG/3DAYS Place 1 patch (1.5 mg total) onto  the skin every 3 (three) days. 05/26/22   Rai, Delene Ruffini, MD  silver sulfADIAZINE (SILVADENE) 1 % cream Apply 1 Application topically daily. Apply to affected area daily plus dry dressing 01/02/23   Nadara Mustard, MD  torsemide (DEMADEX) 20 MG tablet as needed (fluid). Patient not taking: Reported on 12/26/2022    [provider]    Allergies:   Allergies  Allergen Reactions   Atorvastatin Other (See Comments)    Dizziness    Dulaglutide Nausea And Vomiting and Other (See Comments)    Caused pancreatitis    Metoclopramide Other (See Comments)    Possible movement disorder or tardive dyskinesia while on reglan Long term   Other Itching    Peas and carrots   Sertraline Nausea And Vomiting   Vancomycin Itching and Swelling   Zofran [Ondansetron] Nausea And Vomiting and Other (See Comments)    Causes nausea vomiting to worsen / doesn't really work for patient.   Amoxicillin Itching and Rash   Lantus [Insulin Glargine] Itching and Rash    Has tolerated insulin glargine many times with no  reported issues   Miconazole Nitrate Nausea And Vomiting and Rash    Social History:  reports that she has never smoked. She has never used smokeless tobacco. She reports that she does not currently use alcohol. She reports that she does not use drugs.  Family History: Family History  Problem Relation Age of Onset   Diabetes Mother        type 2   Hypertension Mother    Thyroid disease Mother    Bipolar disorder Mother    Heart disease Mother    Calcium disorder Mother    Cancer Maternal Grandmother        Breast, stomach   Cancer Paternal Grandmother        stomach, lung (smoker)   Diabetes Paternal Grandmother    Diabetes Paternal Grandfather    Heart failure Sister    Diabetes Sister    Stroke Maternal Aunt    Cancer Maternal Uncle        prostate   CAD Maternal Aunt        stents   Cancer Maternal Aunt 9       ovarian    Physical Exam: Vitals:   04/30/23 0400 04/30/23 0420 04/30/23 0440 04/30/23 0444  BP: (!) 148/76 (!) 149/78 (!) 150/80   Pulse: (!) 107 (!) 109 (!) 35   Resp: (!) 25 11 (!) 35   Temp:    98.7 F (37.1 C)  TempSrc:    Oral  SpO2: 100% 100% 95%   Weight:      Height:        General:  Arousable female, sleepy (medicated), well developed and nourished Eyes: No scleral icterus ENT: Dry oral mucosa, Lungs: CTA B/L, no wheeze, no crackles, no use of accessory muscles Cardiovascular: Tachy, RRR, no murmurs. No carotid bruits, no JVD Abdomen: soft, positive BS, benign abdomen, not an acute abdomen GU: not examined Neuro: CN II - XII grossly intact,  Musculoskeletal: strength 5/5 moves all extremities, no edema Skin: no rash, no subcutaneous crepitation, no decubitus Psych: sleepy patient   Labs on Admission:  Recent Labs    04/30/23 0140 04/30/23 0155 04/30/23 0156  NA 130* 133* 130*  K 3.7 3.8 5.7*  CL 92* 98  --   CO2 21*  --   --   GLUCOSE 875* >700*  --   BUN 34* 35*  --  CREATININE 4.97* 5.10*  --   CALCIUM 9.5  --   --     Recent Labs    04/30/23 0140  AST 13*  ALT 12  ALKPHOS 92  BILITOT 2.1*  PROT 7.7  ALBUMIN 3.6   Recent Labs    04/30/23 0140  LIPASE 27   Recent Labs    04/30/23 0140 04/30/23 0155 04/30/23 0156  WBC 7.7  --   --   NEUTROABS 6.4  --   --   HGB 12.4 13.9 12.9  HCT 37.0 41.0 38.0  MCV 89.4  --   --   PLT 153  --   --     Micro Results: No results found for this or any previous visit (from the past 240 hour(s)).   Radiological Exams on Admission: DG Abd Portable 2 Views  Result Date: 04/30/2023 CLINICAL DATA:  Abdominal pain, vomiting EXAM: PORTABLE ABDOMEN - 2 VIEW COMPARISON:  None Available. FINDINGS: The bowel gas pattern is normal. There is no evidence of free air. No radio-opaque calculi or other significant radiographic abnormality is seen. IMPRESSION: Negative. Electronically Signed   By: Helyn Numbers M.D.   On: 04/30/2023 01:16   DG Chest Portable 1 View  Result Date: 04/30/2023 CLINICAL DATA:  Vomiting EXAM: PORTABLE CHEST 1 VIEW COMPARISON:  11/04/2022 FINDINGS: Lungs are clear. No pneumothorax or pleural effusion. Right internal jugular hemodialysis catheter tip seen at the superior cavoatrial junction. 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 Cardiac size within normal limits. Pulmonary vascularity is normal IMPRESSION: 1. No active disease. Electronically Signed   By: Helyn Numbers M.D.   On: 04/30/2023 01:11    Assessment/Plan Present on Admission:  Intractable nausea and vomiting secondary to diabetic gastroparesis  Hematemesis //  History of esophagitis -NPO, IVF hydration -Continue IV protonix BiD -Serial H/H -continue IV zofran and phenergan PRN -ASA on hold -Most recent EGD 05/2022 shows esophagitis. GI not consulted -Continue scopalamine patch   HHS in patient with T1DM // Lactic acidosis -Continue insulin gtt -cautious IVF hydration. Patient w/ ESRD -Lactic acidosis d/t dehydration from hyperglycemia -Insulin  pump remains off   Hypertension, uncontrolled -PRN hydralazine ordered -Metoprolol resumed   Chronic combined systolic and diastolic heart failure (HCC) //  NICM  -Stable. Patient more hypovolemic currently -Demadex and ASA on hold -Metoprolol ordered   ESRD (end stage renal disease) (HCC) -Stable, now more on the dry side -Nephrology consulted   Mixed hyperlipidemia -Zetia and pravastatin on hold   Enmanuel Zufall 04/30/2023, 5:09 AM

## 2023-04-30 NOTE — ED Notes (Signed)
Verified with Rancour, MD, keep insulin at 6.5units/hr

## 2023-04-30 NOTE — ED Triage Notes (Signed)
Pt BIB [facility] from home. Pt reports ABD pain starting today 3pm followed by coffee ground emesis starting at 8pm.   Pt received full dialysis yesterday (MWF). Pt also has not been compliant with Diabetes meds, so per report CBG has been elevated.

## 2023-04-30 NOTE — ED Notes (Signed)
CBG reading >600. Provider made aware. Order to keep insulin at 6.5units/hr at this time.

## 2023-04-30 NOTE — ED Notes (Signed)
ED TO INPATIENT HANDOFF REPORT  ED Nurse Name and Phone #:  Tanaya Dunigan [phone]  S Name/Age/Gender [name] 52 y.o. female Room/Bed: 026C/026C  Code Status   Code Status: Full Code  Home/SNF/Other Home Patient oriented to: situation Is this baseline? Yes   Triage Complete: Triage complete  Chief Complaint Type 1 diabetes mellitus with hyperosmolar hyperglycemic state (HHS) (HCC) [E10.69, E10.65, E87.0]  Triage Note Pt BIB [facility] from home. Pt reports ABD pain starting today 3pm followed by coffee ground emesis starting at 8pm.   Pt received full dialysis yesterday (MWF). Pt also has not been compliant with Diabetes meds, so per report CBG has been elevated.    Allergies Allergies  Allergen Reactions   Atorvastatin Other (See Comments)    Dizziness    Dulaglutide Nausea And Vomiting and Other (See Comments)    Caused pancreatitis    Metoclopramide Other (See Comments)    Possible movement disorder or tardive dyskinesia while on reglan Long term   Other Itching    Peas and carrots   Sertraline Nausea And Vomiting   Vancomycin Itching and Swelling   Zofran [Ondansetron] Nausea And Vomiting and Other (See Comments)    Causes nausea vomiting to worsen / doesn't really work for patient.   Amoxicillin Itching and Rash   Lantus [Insulin Glargine] Itching and Rash    Has tolerated insulin glargine many times with no reported issues   Miconazole Nitrate Nausea And Vomiting and Rash    Level of Care/Admitting Diagnosis ED Disposition     ED Disposition  Admit   Condition  --   Comment  Hospital Area: [facility] [[id number]]  Level of Care: Progressive [102]  Admit to Progressive based on following criteria: GI, ENDOCRINE disease patients with GI bleeding, acute liver failure or pancreatitis, stable with diabetic ketoacidosis or thyrotoxicosis (hypothyroid) state.  May place patient in observation at [facility] or [facility] if equivalent level of care is  available:: Yes  Covid Evaluation: Confirmed COVID Negative  Diagnosis: Type 1 diabetes mellitus with hyperosmolar hyperglycemic state (HHS) [facility]) [[id number]]  Admitting Physician: Gery Pray [[id number]]  Attending Physician: Burnadette Pop [[id number]]          B Medical/Surgery History Past Medical History:  Diagnosis Date   AICD (automatic cardioverter/defibrillator) present    [employer]   Anemia    Anxiety    Arthritis    Asthma    CHF (congestive heart failure) (HCC)    Chronic diastolic CHF (congestive heart failure) (HCC) 12/14/2019   Depression    Diabetic ulcer of left foot (HCC) 05/12/2013   ESRD on hemodialysis (HCC)    MWF at [facility]   Gastroparesis    Generalized abdominal pain    History of chicken pox    Loss of weight 12/02/2019   Migraines    Mixed hyperlipidemia 09/05/2022   Mood disorder (HCC)    anxiety   Myocardial infarction (HCC)    Prurigo nodularis    with diabetic dermopathy   Type 1 diabetes mellitus (HCC) 05/23/2022   Type 1 diabetes, uncontrolled, with neuropathy    [place]   Ulcers of both lower legs (HCC) 02/20/2014   Past Surgical History:  Procedure Laterality Date   AV FISTULA PLACEMENT Left 10/01/2022   Procedure: ARTERIOVENOUS (AV)FISTULA CREATION;  Surgeon: Larina Earthly, MD;  Location: [facility];  Service: Vascular;  Laterality: Left;   AV FISTULA PLACEMENT Right 11/19/2022   Procedure: INSERTION OF RIGHT ARM ARTERIOVENOUS (AV) GORE-TEX  GRAFT;  Surgeon: Chuck Hint, MD;  Location: [facility] OR;  Service: Vascular;  Laterality: Right;   BIOPSY  08/11/2020   Procedure: BIOPSY;  Surgeon: Meryl Dare, MD;  Location: [facility];  Service: Endoscopy;;   BIOPSY  04/02/2022   Procedure: BIOPSY;  Surgeon: Lynann Bologna, MD;  Location: [facility] ENDOSCOPY;  Service: Gastroenterology;;   ESOPHAGOGASTRODUODENOSCOPY (EGD) WITH PROPOFOL N/A 08/11/2020   Procedure: ESOPHAGOGASTRODUODENOSCOPY (EGD) WITH PROPOFOL;  Surgeon: Meryl Dare, MD;   Location: [facility] ENDOSCOPY;  Service: Endoscopy;  Laterality: N/A;   ESOPHAGOGASTRODUODENOSCOPY (EGD) WITH PROPOFOL N/A 04/02/2022   Procedure: ESOPHAGOGASTRODUODENOSCOPY (EGD) WITH PROPOFOL;  Surgeon: Lynann Bologna, MD;  Location: [facility] ENDOSCOPY;  Service: Gastroenterology;  Laterality: N/A;   ESOPHAGOGASTRODUODENOSCOPY (EGD) WITH PROPOFOL N/A 05/23/2022   Procedure: ESOPHAGOGASTRODUODENOSCOPY (EGD) WITH PROPOFOL;  Surgeon: Midge Minium, MD;  Location: [facility] ENDOSCOPY;  Service: Endoscopy;  Laterality: N/A;   ICD IMPLANT N/A 04/09/2022   Procedure: ICD IMPLANT;  Surgeon: [name], MD;  Location: [facility] INVASIVE CV LAB;  Service: Cardiovascular;  Laterality: N/A;   IR FLUORO GUIDE CV LINE RIGHT  10/07/2022   IR US GUIDE VASC ACCESS RIGHT  10/07/2022   treadmill stress test  01/2013   WNL, low risk study     A IV Location/Drains/Wounds Patient Lines/Drains/Airways Status     Active Line/Drains/Airways     Name Placement date Placement time Site Days   Peripheral IV 04/30/23 20 G 2.5" Anterior;Left;Proximal Forearm 04/30/23  0219  Forearm  less than 1   Peripheral IV 04/30/23 22 G 2.5" Anterior;Left Forearm 04/30/23  0219  Forearm  less than 1   Peripheral IV 04/30/23 22 G Anterior;Distal;Left Wrist 04/30/23  0612  Wrist  less than 1   Fistula / Graft Left Forearm Arteriovenous fistula 10/01/22  1235  Forearm  211   Fistula / Graft Right Forearm Arteriovenous vein graft 11/19/22  0818  Forearm  162   Hemodialysis Catheter Right Subclavian Double lumen Permanent (Tunneled) 10/07/22  1458  Subclavian  205   Pressure Injury 10/21/22 Sacrum Medial Stage 2 -  Partial thickness loss of dermis presenting as a shallow open injury with a red, pink wound bed without slough. red, small 10/21/22  1854  -- 191            Intake/Output Last 24 hours  Intake/Output Summary (Last 24 hours) at 04/30/2023 1623 Last data filed at 04/30/2023 1209 Gross per 24 hour  Intake 3758.68 ml  Output --  Net 3758.68  ml    Labs/Imaging Results for orders placed or performed during the hospital encounter of 04/30/23 (from the past 48 hour(s))  CBG monitoring, ED     Status: Abnormal   Collection Time: 04/30/23  1:00 AM  Result Value Ref Range   Glucose-Capillary >600 (HH) 70 - 99 mg/dL    Comment: Glucose reference range applies only to samples taken after fasting for at least 8 hours.   Comment 1 Notify RN    Comment 2 Document in Chart   Type and screen [facility]     Status: None   Collection Time: 04/30/23  1:38 AM  Result Value Ref Range   ABO/RH(D) A POS    Antibody Screen NEG    Sample Expiration      05/03/2023,2359 Performed at [facility] Lab, [address] [place], [place] [address]   CBC with Differential     Status: Abnormal   Collection Time: 04/30/23  1:40 AM  Result Value Ref Range   WBC 7.7 4.0 - 10.5 K/uL   RBC 4.14 3.87 - 5.11 MIL/uL   Hemoglobin 12.4 12.0 - 15.0 g/dL   HCT 95.2 84.1 - 32.4 %   MCV 89.4 80.0 - 100.0 fL   MCH 30.0 26.0 - 34.0 pg   MCHC 33.5 30.0 - 36.0 g/dL   RDW 40.1 (H) 02.7 - 25.3 %   Platelets 153 150 - 400 K/uL   nRBC 0.0 0.0 - 0.2 %   Neutrophils Relative % 84 %   Neutro Abs 6.4 1.7 - 7.7 K/uL   Lymphocytes Relative 11 %   Lymphs Abs 0.8 0.7 - 4.0 K/uL   Monocytes Relative 4 %   Monocytes Absolute 0.3 0.1 - 1.0 K/uL   Eosinophils Relative 1 %   Eosinophils Absolute 0.1 0.0 - 0.5 K/uL   Basophils Relative 0 %   Basophils Absolute 0.0 0.0 - 0.1 K/uL   Immature Granulocytes 0 %   Abs Immature Granulocytes 0.02 0.00 - 0.07 K/uL    Comment: Performed at [facility] Lab, [address] [place], [place] [address]  Comprehensive metabolic panel     Status: Abnormal   Collection Time: 04/30/23  1:40 AM  Result Value Ref Range   Sodium 130 (L) 135 - 145 mmol/L   Potassium 3.7 3.5 - 5.1 mmol/L   Chloride 92 (L) 98 - 111 mmol/L   CO2 21 (L) 22 - 32 mmol/L   Glucose, Bld 875 (HH) 70 - 99 mg/dL    Comment: CRITICAL RESULT  CALLED TO, READ BACK BY AND VERIFIED WITH YOUNG.T RN 04/30/23 AMIREHSANI F Glucose reference range applies only to samples taken after fasting for at least 8 hours. Tim called was 258 CORRECTED ON 09/25 AT 0710: PREVIOUSLY REPORTED AS 875 CRITICAL RESULT CALLED TO, READ BACK BY AND VERIFIED WITH YOUNG.T RN 04/30/23 AMIREHSANI F Glucose reference range applies only to samples taken after fasting for at least 8 hours., CORRECTED ON  09/25 AT 0256: PREVIOUSLY REPORTED AS 875 CRITICAL RESULT CALLED TO, READ BACK BY AND VERIFIED WITH YOUNG.T RN 04/29/23 AMIREHSANI F Glucose reference range applies only to samples taken after fasting for at least 8 hours.    BUN 34 (H) 6 - 20 mg/dL   Creatinine, Ser 3.47 (H) 0.44 - 1.00 mg/dL   Calcium 9.5 8.9 - 42.5 mg/dL   Total Protein 7.7 6.5 - 8.1 g/dL   Albumin 3.6 3.5 - 5.0 g/dL   AST 13 (L) 15 - 41 U/L   ALT 12 0 - 44 U/L   Alkaline Phosphatase 92 38 - 126 U/L   Total Bilirubin 2.1 (H) 0.3 - 1.2 mg/dL   GFR, Estimated 10 (L) >60 mL/min    Comment: (NOTE) Calculated using the CKD-EPI Creatinine Equation (2021)    Anion gap 17 (H) 5 - 15    Comment: Performed at [facility], [address] [place], [place] [address]  Lactic acid, plasma     Status: None   Collection Time: 04/30/23  1:40 AM  Result Value Ref Range   Lactic Acid, Venous 1.5 0.5 - 1.9 mmol/L    Comment: Performed at [facility], [address] [place], [place] [address]  Protime-INR     Status: None   Collection Time: 04/30/23  1:40 AM  Result Value Ref Range   Prothrombin Time 13.2 11.4 - 15.2 seconds   INR 1.0 0.8 - 1.2    Comment: (NOTE)  INR goal varies based on device and disease states. Performed at [facility], [address], [place], [place] [address]   Lipase, blood     Status: None   Collection Time: 04/30/23  1:40 AM  Result Value Ref Range   Lipase 27 11 - 51 U/L    Comment: Performed at [facility], [address], [place], [place]  [address]  Ammonia     Status: None   Collection Time: 04/30/23  1:40 AM  Result Value Ref Range   Ammonia 18 9 - 35 umol/L    Comment: Performed at [facility], [address], [place], [place] [address]  Beta-hydroxybutyric acid     Status: Abnormal   Collection Time: 04/30/23  1:40 AM  Result Value Ref Range   Beta-Hydroxybutyric Acid 2.24 (H) 0.05 - 0.27 mmol/L    Comment: Performed at [facility], [address], [place], [place] [address]  I-stat chem 8, ED (not at [facility], [facility] or [facility])     Status: Abnormal   Collection Time: 04/30/23  1:55 AM  Result Value Ref Range   Sodium 133 (L) 135 - 145 mmol/L   Potassium 3.8 3.5 - 5.1 mmol/L   Chloride 98 98 - 111 mmol/L   BUN 35 (H) 6 - 20 mg/dL   Creatinine, Ser 1.66 (H) 0.44 - 1.00 mg/dL   Glucose, Bld >063 (HH) 70 - 99 mg/dL    Comment: Glucose reference range applies only to samples taken after fasting for at least 8 hours.   Calcium, Ion 1.20 1.15 - 1.40 mmol/L   TCO2 22 22 - 32 mmol/L   Hemoglobin 13.9 12.0 - 15.0 g/dL   HCT 01.6 01.0 - 93.2 %   Comment NOTIFIED PHYSICIAN   I-Stat venous blood gas, ([facility] ED, [facility], [facility])     Status: Abnormal   Collection Time: 04/30/23  1:56 AM  Result Value Ref Range   pH, Ven 7.446 (H) 7.25 - 7.43   pCO2, Ven 39.1 (L) 44 - 60 mmHg   pO2, Ven 61 (H) 32 - 45 mmHg   Bicarbonate 26.9 20.0 - 28.0 mmol/L   TCO2 28 22 - 32 mmol/L   O2 Saturation 92 %   Acid-Base Excess 3.0 (H) 0.0 - 2.0 mmol/L   Sodium 130 (L) 135 - 145 mmol/L   Potassium 5.7 (H) 3.5 - 5.1 mmol/L   Calcium, Ion 1.09 (L) 1.15 - 1.40 mmol/L   HCT 38.0 36.0 - 46.0 %   Hemoglobin 12.9 12.0 - 15.0 g/dL   Sample type VENOUS   CBG monitoring, ED     Status: Abnormal   Collection Time: 04/30/23  3:28 AM  Result Value Ref Range   Glucose-Capillary >600 (HH) 70 - 99 mg/dL    Comment: Glucose reference range applies only to samples taken after fasting for at least 8 hours.  CBG monitoring, ED     Status: Abnormal   Collection  Time: 04/30/23  4:01 AM  Result Value Ref Range   Glucose-Capillary >600 (HH) 70 - 99 mg/dL    Comment: Glucose reference range applies only to samples taken after fasting for at least 8 hours.  Lactic acid, plasma     Status: Abnormal   Collection Time: 04/30/23  4:25 AM  Result Value Ref Range   Lactic Acid, Venous 4.2 (HH) 0.5 - 1.9 mmol/L    Comment: CRITICAL RESULT CALLED TO, READ BACK BY AND VERIFIED WITH T.YOUNG RN  0502 04/30/2023 BY G.GANADEN Performed at [facility], [address] [place], [place] [address]   Troponin I (High Sensitivity)     Status: Abnormal   Collection Time: 04/30/23  4:25 AM  Result Value Ref Range   Troponin I (High Sensitivity) 137 (HH) <18 ng/L    Comment: CRITICAL RESULT CALLED TO, READ BACK BY AND VERIFIED WITH T.YOUNG RN [phone] 04/30/2023 BY G.GANADEN (NOTE) Elevated high sensitivity troponin I (hsTnI) values and significant  changes across serial measurements may suggest ACS but many other  chronic and acute conditions are known to elevate hsTnI results.  Refer to the "Links" section for chest pain algorithms and additional  guidance. Performed at [facility], [address], [place], [place] [address]   CBG monitoring, ED     Status: Abnormal   Collection Time: 04/30/23  4:42 AM  Result Value Ref Range   Glucose-Capillary 549 (HH) 70 - 99 mg/dL    Comment: Glucose reference range applies only to samples taken after fasting for at least 8 hours.  CBG monitoring, ED     Status: Abnormal   Collection Time: 04/30/23  5:43 AM  Result Value Ref Range   Glucose-Capillary 515 (HH) 70 - 99 mg/dL    Comment: Glucose reference range applies only to samples taken after fasting for at least 8 hours.  CBG monitoring, ED     Status: Abnormal   Collection Time: 04/30/23  6:34 AM  Result Value Ref Range   Glucose-Capillary 439 (H) 70 - 99 mg/dL    Comment: Glucose reference range applies only to samples taken after fasting for at least 8  hours.  CBG monitoring, ED     Status: Abnormal   Collection Time: 04/30/23  7:15 AM  Result Value Ref Range   Glucose-Capillary 360 (H) 70 - 99 mg/dL    Comment: Glucose reference range applies only to samples taken after fasting for at least 8 hours.  CBG monitoring, ED     Status: Abnormal   Collection Time: 04/30/23  8:17 AM  Result Value Ref Range   Glucose-Capillary 296 (H) 70 - 99 mg/dL    Comment: Glucose reference range applies only to samples taken after fasting for at least 8 hours.  CBG monitoring, ED     Status: Abnormal   Collection Time: 04/30/23  9:22 AM  Result Value Ref Range   Glucose-Capillary 217 (H) 70 - 99 mg/dL    Comment: Glucose reference range applies only to samples taken after fasting for at least 8 hours.  CBG monitoring, ED     Status: Abnormal   Collection Time: 04/30/23 10:29 AM  Result Value Ref Range   Glucose-Capillary 201 (H) 70 - 99 mg/dL    Comment: Glucose reference range applies only to samples taken after fasting for at least 8 hours.  Protime-INR     Status: Abnormal   Collection Time: 04/30/23 10:38 AM  Result Value Ref Range   Prothrombin Time 15.5 (H) 11.4 - 15.2 seconds   INR 1.2 0.8 - 1.2    Comment: (NOTE) INR goal varies based on device and disease states. Performed at [facility], [address], [place], [place] [address]   Hemoglobin and hematocrit, blood     Status: Abnormal   Collection Time: 04/30/23 11:27 AM  Result Value Ref Range   Hemoglobin 10.8 (L) 12.0 - 15.0 g/dL   HCT 84.6 (L) 96.2 - 95.2 %    Comment: Performed  at [facility], [address], [place], [place] [address]  Troponin I (High Sensitivity)     Status: Abnormal   Collection Time: 04/30/23 11:28 AM  Result Value Ref Range   Troponin I (High Sensitivity) 89 (H) <18 ng/L    Comment: (NOTE) Elevated high sensitivity troponin I (hsTnI) values and significant  changes across serial measurements may suggest ACS but many other  chronic and  acute conditions are known to elevate hsTnI results.  Refer to the "Links" section for chest pain algorithms and additional  guidance. Performed at [facility], [address] [place], [place] [address]   CBG monitoring, ED     Status: Abnormal   Collection Time: 04/30/23 11:35 AM  Result Value Ref Range   Glucose-Capillary 160 (H) 70 - 99 mg/dL    Comment: Glucose reference range applies only to samples taken after fasting for at least 8 hours.  CBG monitoring, ED     Status: Abnormal   Collection Time: 04/30/23  2:46 PM  Result Value Ref Range   Glucose-Capillary 224 (H) 70 - 99 mg/dL    Comment: Glucose reference range applies only to samples taken after fasting for at least 8 hours.   DG Abd Portable 2 Views  Result Date: 04/30/2023 CLINICAL DATA:  Abdominal pain, vomiting EXAM: PORTABLE ABDOMEN - 2 VIEW COMPARISON:  None Available. FINDINGS: The bowel gas pattern is normal. There is no evidence of free air. No radio-opaque calculi or other significant radiographic abnormality is seen. IMPRESSION: Negative. Electronically Signed   By: Helyn Numbers M.D.   On: 04/30/2023 01:16   DG Chest Portable 1 View  Result Date: 04/30/2023 CLINICAL DATA:  Vomiting EXAM: PORTABLE CHEST 1 VIEW COMPARISON:  11/04/2022 FINDINGS: Lungs are clear. No pneumothorax or pleural effusion. Right internal jugular hemodialysis catheter tip seen at the superior cavoatrial junction. 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 Cardiac size within normal limits. Pulmonary vascularity is normal IMPRESSION: 1. No active disease. Electronically Signed   By: Helyn Numbers M.D.   On: 04/30/2023 01:11    Pending Labs Unresulted Labs (From admission, onward)     Start     Ordered   05/01/23 0500  CBC  Tomorrow morning,   R        04/30/23 0838   05/01/23 0500  Basic metabolic panel  Tomorrow morning,   R        04/30/23 0838   05/01/23 0500  Lactic acid, plasma  (Lactic Acid)  Tomorrow  morning,   R        04/30/23 0838   04/30/23 1327  Hepatitis B surface antigen  (New Admission Hemo Labs (Hepatitis B))  ONCE - URGENT,   URGENT        04/30/23 1326   04/30/23 1200  Basic metabolic panel  Once,   STAT        04/30/23 1200   04/30/23 1200  Magnesium  Once,   STAT        04/30/23 1200   04/30/23 1200  Phosphorus  Once,   STAT        04/30/23 1200   04/30/23 1159  Hemoglobin A1c  Once,   R       Comments: To assess prior glycemic control    04/30/23 1158   04/30/23 1157  Basic metabolic panel  ONCE - STAT,   STAT        04/30/23 1158   04/30/23 1156  Hemoglobin A1c  Once,   R        04/30/23 1158   04/30/23 0025  Occult bld gastric/duodenum (cup to lab)  Once,   URGENT        04/30/23 0024            Vitals/Pain Today's Vitals   04/30/23 1415 04/30/23 1449 04/30/23 1451 04/30/23 1617  BP: (!) 181/79   (!) 118/97  Pulse:      Resp: 14 (!) 28    Temp:  98.1 F (36.7 C)    TempSrc:  Oral    SpO2: 100%     Weight:      Height:      PainSc:   0-No pain     Isolation Precautions No active isolations  Medications Medications  pantoprozole (PROTONIX) 80 mg /NS 100 mL infusion (0 mg/hr Intravenous Paused 04/30/23 0224)  pantoprazole (PROTONIX) injection 40 mg (has no administration in time range)  dextrose 50 % solution 0-50 mL (has no administration in time range)  acetaminophen (TYLENOL) tablet 650 mg (has no administration in time range)    Or  acetaminophen (TYLENOL) suppository 650 mg (has no administration in time range)  metoprolol succinate (TOPROL-XL) 24 hr tablet 50 mg (50 mg Oral Given 04/30/23 1032)  scopolamine (TRANSDERM-SCOP) 1 MG/3DAYS 1.5 mg (1.5 mg Transdermal Patch Applied 04/30/23 0926)  lactated ringers infusion (0 mLs Intravenous Stopped 04/30/23 0926)  Chlorhexidine Gluconate Cloth 2 % PADS 6 each (has no administration in time range)  amLODipine (NORVASC) tablet 10 mg (10 mg Oral Given 04/30/23 1617)  hydrALAZINE (APRESOLINE) tablet  75 mg (75 mg Oral Given 04/30/23 1618)  isosorbide dinitrate (ISORDIL) tablet 20 mg (20 mg Oral Given 04/30/23 1617)  hydrALAZINE (APRESOLINE) injection 10 mg (10 mg Intravenous Given 04/30/23 1444)  insulin glargine-yfgn (SEMGLEE) injection 20 Units (20 Units Subcutaneous Given 04/30/23 1618)  insulin aspart (novoLOG) injection 0-15 Units (5 Units Subcutaneous Given 04/30/23 1449)  insulin aspart (novoLOG) injection 0-5 Units (has no administration in time range)  lactated ringers bolus 1,000 mL (0 mLs Intravenous Stopped 04/30/23 0613)  promethazine (PHENERGAN) 12.5 mg in sodium chloride 0.9 % 50 mL IVPB (0 mg Intravenous Stopped 04/30/23 0242)  pantoprazole (PROTONIX) 80 mg /NS 100 mL IVPB (0 mg Intravenous Stopped 04/30/23 0254)  lactated ringers bolus 1,000 mL (0 mLs Intravenous Stopped 04/30/23 0348)  hydrALAZINE (APRESOLINE) injection 10 mg (10 mg Intravenous Given 04/30/23 0306)  droperidol (INAPSINE) 2.5 MG/ML injection 2.5 mg (2.5 mg Intravenous Given 04/30/23 0344)  prochlorperazine (COMPAZINE) injection 10 mg (10 mg Intravenous Given 04/30/23 0343)  LORazepam (ATIVAN) injection 1 mg (1 mg Intravenous Given 04/30/23 0352)  lactated ringers bolus 1,000 mL (0 mLs Intravenous Stopped 04/30/23 0719)    Mobility walks     Focused Assessments     R Recommendations: See Admitting Provider Note  Report given to:   Additional Notes:

## 2023-04-30 NOTE — ED Notes (Signed)
Attempted IV x2, unsuccessful.

## 2023-04-30 NOTE — Consult Note (Addendum)
[facility] Renal Consultation Note    Indication for Consultation:  Management of ESRD/hemodialysis; anemia, hypertension/volume and secondary hyperparathyroidism   HPI: Lorisa Naqvi is a 52 y.o. female with ESRD on HD MWF. History of T1DM with gastroparesis. She presented to the ED with abdominal pain, nausea and vomiting. Hypertensive on arrival. Labs notable for hyperglycemia, lactic acidosis. Insulin drip started in the ED. CGBs now in 200s.  Report of coffee ground emesis in the ED.  Appears very fatigued in the ED. Will answer questions briefly. Not endorsing any specific pain.   Dialysis at [facility] MWF. Last dialysis was Monday. She completed a full treatment and left under her dry weight. Using [facility] for dialysis she has a right arm graft that is clotted.   Past Medical History:  Diagnosis Date   AICD (automatic cardioverter/defibrillator) present    [employer]   Anemia    Anxiety    Arthritis    Asthma    CHF (congestive heart failure) (HCC)    Chronic diastolic CHF (congestive heart failure) (HCC) 12/14/2019   Depression    Diabetic ulcer of left foot (HCC) 05/12/2013   ESRD on hemodialysis (HCC)    MWF at [facility]   Gastroparesis    Generalized abdominal pain    History of chicken pox    Loss of weight 12/02/2019   Migraines    Mixed hyperlipidemia 09/05/2022   Mood disorder (HCC)    anxiety   Myocardial infarction (HCC)    Prurigo nodularis    with diabetic dermopathy   Type 1 diabetes mellitus (HCC) 05/23/2022   Type 1 diabetes, uncontrolled, with neuropathy    [place]   Ulcers of both lower legs (HCC) 02/20/2014   Past Surgical History:  Procedure Laterality Date   AV FISTULA PLACEMENT Left 10/01/2022   Procedure: ARTERIOVENOUS (AV)FISTULA CREATION;  Surgeon: Larina Earthly, MD;  Location: [facility];  Service: Vascular;  Laterality: Left;   AV FISTULA PLACEMENT Right 11/19/2022   Procedure: INSERTION OF RIGHT ARM ARTERIOVENOUS  (AV) GORE-TEX GRAFT;  Surgeon: Chuck Hint, MD;  Location: [facility] OR;  Service: Vascular;  Laterality: Right;   BIOPSY  08/11/2020   Procedure: BIOPSY;  Surgeon: Meryl Dare, MD;  Location: [facility] ENDOS[facility];  Service: Endoscopy;;   BIOPSY  04/02/2022   Procedure: BIOPSY;  Surgeon: Lynann Bologna, MD;  Location: [facility] ENDOSCOPY;  Service: Gastroenterology;;   ESOPHAGOGASTRODUODENOSCOPY (EGD) WITH PROPOFOL N/A 08/11/2020   Procedure: ESOPHAGOGASTRODUODENOSCOPY (EGD) WITH PROPOFOL;  Surgeon: Meryl Dare, MD;  Location: [facility];  Service: Endoscopy;  Laterality: N/A;   ESOPHAGOGASTRODUODENOSCOPY (EGD) WITH PROPOFOL N/A 04/02/2022   Procedure: ESOPHAGOGASTRODUODENOSCOPY (EGD) WITH PROPOFOL;  Surgeon: Lynann Bologna, MD;  Location: [facility];  Service: Gastroenterology;  Laterality: N/A;   ESOPHAGOGASTRODUODENOSCOPY (EGD) WITH PROPOFOL N/A 05/23/2022   Procedure: ESOPHAGOGASTRODUODENOSCOPY (EGD) WITH PROPOFOL;  Surgeon: Midge Minium, MD;  Location: [facility] ENDOSCOPY;  Service: Endoscopy;  Laterality: N/A;   ICD IMPLANT N/A 04/09/2022   Procedure: ICD IMPLANT;  Surgeon: [name], MD;  Location: [facility] INVASIVE CV LAB;  Service: Cardiovascular;  Laterality: N/A;   IR FLUORO GUIDE CV LINE RIGHT  10/07/2022   IR US GUIDE VASC ACCESS RIGHT  10/07/2022   treadmill stress test  01/2013   WNL, low risk study   Family History  Problem Relation Age of Onset   Diabetes Mother        type 2   Hypertension Mother    Thyroid disease Mother  Bipolar disorder Mother    Heart disease Mother    Calcium disorder Mother    Cancer Maternal Grandmother        Breast, stomach   Cancer Paternal Grandmother        stomach, lung (smoker)   Diabetes Paternal Grandmother    Diabetes Paternal Grandfather    Heart failure Sister    Diabetes Sister    Stroke Maternal Aunt    Cancer Maternal Uncle        prostate   CAD Maternal Aunt        stents   Cancer Maternal Aunt 1       ovarian   Social  History:  reports that she has never smoked. She has never used smokeless tobacco. She reports that she does not currently use alcohol. She reports that she does not use drugs. Allergies  Allergen Reactions   Atorvastatin Other (See Comments)    Dizziness    Dulaglutide Nausea And Vomiting and Other (See Comments)    Caused pancreatitis    Metoclopramide Other (See Comments)    Possible movement disorder or tardive dyskinesia while on reglan Long term   Other Itching    Peas and carrots   Sertraline Nausea And Vomiting   Vancomycin Itching and Swelling   Zofran [Ondansetron] Nausea And Vomiting and Other (See Comments)    Causes nausea vomiting to worsen / doesn't really work for patient.   Amoxicillin Itching and Rash   Lantus [Insulin Glargine] Itching and Rash    Has tolerated insulin glargine many times with no reported issues   Miconazole Nitrate Nausea And Vomiting and Rash   Prior to Admission medications   Medication Sig Start Date End Date Taking? Authorizing Provider  Vitamin D, Ergocalciferol, (DRISDOL) 1.25 MG (50000 UNIT) CAPS capsule Take 50,000 Units by mouth once a week. 02/10/23  Yes [provider]  acetaminophen (TYLENOL) 500 MG tablet Take 1,000 mg by mouth every 8 (eight) hours as needed for moderate pain or mild pain.    [provider]  albuterol (PROAIR HFA) 108 (90 Base) MCG/ACT inhaler Inhale 2 puffs into the lungs every 6 (six) hours as needed for wheezing or shortness of breath. 07/03/20   Waldon Merl, PA-C  amLODipine (NORVASC) 10 MG tablet Take 10 mg by mouth daily. 10/18/22   [provider]  aspirin EC 81 MG tablet Take 1 tablet (81 mg total) by mouth daily. Swallow whole. 10/16/22   Hughie Closs, MD  cloNIDine (CATAPRES - DOSED IN MG/24 HR) 0.1 mg/24hr patch Place 0.1 mg onto the skin once a week. 12/13/22   [provider]  Continuous Glucose Sensor (FREESTYLE LIBRE 3 SENSOR) MISC Change sensor every 14 days as  directed 10/15/22   Talmage Coin, MD  ezetimibe (ZETIA) 10 MG tablet Take 1 tablet (10 mg total) by mouth daily. 12/09/22   Chandrasekhar, Rondel Jumbo, MD  FIASP 100 UNIT/ML SOLN 120 Units daily at 6 (six) AM. 12/03/22   [provider]  hydrALAZINE (APRESOLINE) 50 MG tablet Take 1.5 tablets (75 mg total) by mouth 3 (three) times daily. 12/10/22   Christell Constant, MD  hydrOXYzine (ATARAX) 50 MG tablet Take 50 mg by mouth 3 (three) times daily.    [provider]  insulin aspart (NOVOLOG) 100 UNIT/ML injection Inject 120 Units into the skin See admin instructions. Patient uses this with the Omnipod insulin pump  its vary according to blood sugar. Loads 120 units every 3 days.  [provider]  isosorbide dinitrate (ISORDIL) 20 MG tablet Take 20 mg by mouth. 10/18/22   [provider]  LORazepam (ATIVAN) 0.5 MG tablet Take 1 tablet (0.5 mg total) by mouth every 8 (eight) hours as needed for anxiety (Refractory nausea and vomiting). 07/17/22   Zannie Cove, MD  Methoxy PEG-Epoetin Beta (MIRCERA IJ) as directed. Kidney clinic 11/27/22 11/26/23  [provider]  metoCLOPramide (REGLAN) 5 MG tablet Take 5 mg by mouth every 8 (eight) hours as needed.    [provider]  metoprolol succinate (TOPROL-XL) 50 MG 24 hr tablet Take 1 tablet (50 mg total) by mouth daily. Take with or immediately following a meal. 12/10/22   Chandrasekhar, Mahesh A, MD  nitroGLYCERIN (NITROSTAT) 0.4 MG SL tablet Place 1 tablet (0.4 mg total) under the tongue every 5 (five) minutes as needed for chest pain. 09/05/22 12/10/22  Christell Constant, MD  olmesartan (BENICAR) 20 MG tablet Take 20 mg by mouth daily. 02/26/23   [provider]  pantoprazole (PROTONIX) 40 MG tablet Take 1 tablet (40 mg total) by mouth 2 (two) times daily before a meal. Patient taking differently: Take 40 mg by mouth 2 (two) times daily as needed (heartburn). 05/24/22 12/10/22  Rai, Ripudeep K, MD   polyethylene glycol (MIRALAX / GLYCOLAX) 17 g packet Take 17 g by mouth daily as needed for moderate constipation.    [provider]  pravastatin (PRAVACHOL) 40 MG tablet Take 1 tablet (40 mg total) by mouth every evening. 12/09/22   Chandrasekhar, Mahesh A, MD  pregabalin (LYRICA) 50 MG capsule Take 50 mg by mouth daily as needed (neuropathy). 12/05/21   [provider]  prochlorperazine (COMPAZINE) 10 MG tablet Take 1 tablet (10 mg total) by mouth every 6 (six) hours as needed for up to 7 days for nausea or vomiting. 11/05/22 12/10/22  Sherryll Burger, Pratik D, DO  promethazine (PHENERGAN) 25 MG suppository Unwrap and place 1 suppository (25 mg total) rectally every 6 (six) hours as needed for nausea or vomiting. 11/05/22   Sherryll Burger, Pratik D, DO  scopolamine (TRANSDERM-SCOP) 1 MG/3DAYS Place 1 patch (1.5 mg total) onto the skin every 3 (three) days. 05/26/22   Rai, Delene Ruffini, MD  silver sulfADIAZINE (SILVADENE) 1 % cream Apply 1 Application topically daily. Apply to affected area daily plus dry dressing 01/02/23   Nadara Mustard, MD  tobramycin (TOBREX) 0.3 % ophthalmic solution SMARTSIG:In Eye(s) 04/21/23   [provider]  torsemide (DEMADEX) 20 MG tablet as needed (fluid). Patient not taking: Reported on 12/26/2022    [provider]   Current Facility-Administered Medications  Medication Dose Route Frequency Provider Last Rate Last Admin   acetaminophen (TYLENOL) tablet 650 mg  650 mg Oral Q6H PRN Crosley, Debby, MD       Or   acetaminophen (TYLENOL) suppository 650 mg  650 mg Rectal Q6H PRN Crosley, Debby, MD       amLODipine (NORVASC) tablet 10 mg  10 mg Oral Daily Adhikari, Amrit, MD       Chlorhexidine Gluconate Cloth 2 % PADS 6 each  6 each Topical Q0600 Tomasa Blase, PA-C       dextrose 50 % solution 0-50 mL  0-50 mL Intravenous PRN Rancour, Stephen, MD       hydrALAZINE (APRESOLINE) injection 10 mg  10 mg Intravenous Q4H PRN Burnadette Pop, MD   10 mg at  04/30/23 1444   hydrALAZINE (APRESOLINE) tablet 75 mg  75 mg Oral TID  Burnadette Pop, MD       insulin aspart (novoLOG) injection 0-15 Units  0-15 Units Subcutaneous TID WC Burnadette Pop, MD   5 Units at 04/30/23 1449   insulin aspart (novoLOG) injection 0-5 Units  0-5 Units Subcutaneous QHS Adhikari, Amrit, MD       insulin glargine-yfgn (SEMGLEE) injection 20 Units  20 Units Subcutaneous Daily Adhikari, Amrit, MD       isosorbide dinitrate (ISORDIL) tablet 20 mg  20 mg Oral Daily Adhikari, Amrit, MD       metoprolol succinate (TOPROL-XL) 24 hr tablet 50 mg  50 mg Oral Daily Crosley, Debby, MD   50 mg at 04/30/23 1032   [START ON 05/03/2023] pantoprazole (PROTONIX) injection 40 mg  40 mg Intravenous Q12H Rancour, Jeannett Senior, MD       pantoprozole (PROTONIX) 80 mg /NS 100 mL infusion  8 mg/hr Intravenous Continuous Rancour, Jeannett Senior, MD   Paused at 04/30/23 0224   scopolamine (TRANSDERM-SCOP) 1 MG/3DAYS 1.5 mg  1 patch Transdermal Q72H Crosley, Debby, MD   1.5 mg at 04/30/23 7829   Current Outpatient Medications  Medication Sig Dispense Refill   Vitamin D, Ergocalciferol, (DRISDOL) 1.25 MG (50000 UNIT) CAPS capsule Take 50,000 Units by mouth once a week.     acetaminophen (TYLENOL) 500 MG tablet Take 1,000 mg by mouth every 8 (eight) hours as needed for moderate pain or mild pain.     albuterol (PROAIR HFA) 108 (90 Base) MCG/ACT inhaler Inhale 2 puffs into the lungs every 6 (six) hours as needed for wheezing or shortness of breath. 6.7 g 1   amLODipine (NORVASC) 10 MG tablet Take 10 mg by mouth daily.     aspirin EC 81 MG tablet Take 1 tablet (81 mg total) by mouth daily. Swallow whole. 30 tablet 12   cloNIDine (CATAPRES - DOSED IN MG/24 HR) 0.1 mg/24hr patch Place 0.1 mg onto the skin once a week.     Continuous Glucose Sensor (FREESTYLE LIBRE 3 SENSOR) MISC Change sensor every 14 days as directed 6 each 4   ezetimibe (ZETIA) 10 MG tablet Take 1 tablet (10 mg total) by mouth daily. 90 tablet 3    FIASP 100 UNIT/ML SOLN 120 Units daily at 6 (six) AM.     hydrALAZINE (APRESOLINE) 50 MG tablet Take 1.5 tablets (75 mg total) by mouth 3 (three) times daily. 405 tablet 3   hydrOXYzine (ATARAX) 50 MG tablet Take 50 mg by mouth 3 (three) times daily.     insulin aspart (NOVOLOG) 100 UNIT/ML injection Inject 120 Units into the skin See admin instructions. Patient uses this with the Omnipod insulin pump  its vary according to blood sugar. Loads 120 units every 3 days.     isosorbide dinitrate (ISORDIL) 20 MG tablet Take 20 mg by mouth.     LORazepam (ATIVAN) 0.5 MG tablet Take 1 tablet (0.5 mg total) by mouth every 8 (eight) hours as needed for anxiety (Refractory nausea and vomiting). 30 tablet 0   Methoxy PEG-Epoetin Beta (MIRCERA IJ) as directed. Kidney clinic     metoCLOPramide (REGLAN) 5 MG tablet Take 5 mg by mouth every 8 (eight) hours as needed.     metoprolol succinate (TOPROL-XL) 50 MG 24 hr tablet Take 1 tablet (50 mg total) by mouth daily. Take with or immediately following a meal. 90 tablet 3   nitroGLYCERIN (NITROSTAT) 0.4 MG SL tablet Place 1 tablet (0.4 mg total) under the tongue every 5 (five) minutes as needed for chest  pain. 90 tablet 3   olmesartan (BENICAR) 20 MG tablet Take 20 mg by mouth daily.     pantoprazole (PROTONIX) 40 MG tablet Take 1 tablet (40 mg total) by mouth 2 (two) times daily before a meal. (Patient taking differently: Take 40 mg by mouth 2 (two) times daily as needed (heartburn).) 60 tablet 1   polyethylene glycol (MIRALAX / GLYCOLAX) 17 g packet Take 17 g by mouth daily as needed for moderate constipation.     pravastatin (PRAVACHOL) 40 MG tablet Take 1 tablet (40 mg total) by mouth every evening. 90 tablet 3   pregabalin (LYRICA) 50 MG capsule Take 50 mg by mouth daily as needed (neuropathy).     prochlorperazine (COMPAZINE) 10 MG tablet Take 1 tablet (10 mg total) by mouth every 6 (six) hours as needed for up to 7 days for nausea or vomiting. 28 tablet 0    promethazine (PHENERGAN) 25 MG suppository Unwrap and place 1 suppository (25 mg total) rectally every 6 (six) hours as needed for nausea or vomiting. 30 each 2   scopolamine (TRANSDERM-SCOP) 1 MG/3DAYS Place 1 patch (1.5 mg total) onto the skin every 3 (three) days. 24 patch 4   silver sulfADIAZINE (SILVADENE) 1 % cream Apply 1 Application topically daily. Apply to affected area daily plus dry dressing 400 g 3   tobramycin (TOBREX) 0.3 % ophthalmic solution SMARTSIG:In Eye(s)     torsemide (DEMADEX) 20 MG tablet as needed (fluid). (Patient not taking: Reported on 12/26/2022)       ROS: As per HPI otherwise negative.  Physical Exam: Vitals:   04/30/23 1026 04/30/23 1029 04/30/23 1415 04/30/23 1449  BP: (!) 170/87  (!) 181/79   Pulse: 93     Resp: 20  14 (!) 28  Temp:  97.8 F (36.6 C)  98.1 F (36.7 C)  TempSrc:  Oral  Oral  SpO2: 100%  100%   Weight:      Height:         General: Chronically ill appearing, somnolent  Head: NCAT sclera not icteric MMM Neck: Supple. No JVD appreciated  Lungs: Clear bilaterally, normal WOB  Heart: RRR, no murmur, rub, or gallop  Abdomen: soft non-tender  Lower extremities:without edema or ischemic changes Neuro: Wakes briefly, follows commands Psych:  Somnolent  Dialysis Access: TDC; RUE AVF not functional    Labs: Basic Metabolic Panel: Recent Labs  Lab 04/30/23 0140 04/30/23 0155 04/30/23 0156  NA 130* 133* 130*  K 3.7 3.8 5.7*  CL 92* 98  --   CO2 21*  --   --   GLUCOSE 875* >700*  --   BUN 34* 35*  --   CREATININE 4.97* 5.10*  --   CALCIUM 9.5  --   --    Liver Function Tests: Recent Labs  Lab 04/30/23 0140  AST 13*  ALT 12  ALKPHOS 92  BILITOT 2.1*  PROT 7.7  ALBUMIN 3.6   Recent Labs  Lab 04/30/23 0140  LIPASE 27   Recent Labs  Lab 04/30/23 0140  AMMONIA 18   CBC: Recent Labs  Lab 04/30/23 0140 04/30/23 0155 04/30/23 0156 04/30/23 1127  WBC 7.7  --   --   --   NEUTROABS 6.4  --   --   --   HGB 12.4  13.9 12.9 10.8*  HCT 37.0 41.0 38.0 32.7*  MCV 89.4  --   --   --   PLT 153  --   --   --  Cardiac Enzymes: No results for input(s): "CKTOTAL", "CKMB", "CKMBINDEX", "TROPONINI" in the last 168 hours. CBG: Recent Labs  Lab 04/30/23 0817 04/30/23 0922 04/30/23 1029 04/30/23 1135 04/30/23 1446  GLUCAP 296* 217* 201* 160* 224*   Iron Studies: No results for input(s): "IRON", "TIBC", "TRANSFERRIN", "FERRITIN" in the last 72 hours. Studies/Results: DG Abd Portable 2 Views  Result Date: 04/30/2023 CLINICAL DATA:  Abdominal pain, vomiting EXAM: PORTABLE ABDOMEN - 2 VIEW COMPARISON:  None Available. FINDINGS: The bowel gas pattern is normal. There is no evidence of free air. No radio-opaque calculi or other significant radiographic abnormality is seen. IMPRESSION: Negative. Electronically Signed   By: Helyn Numbers M.D.   On: 04/30/2023 01:16   DG Chest Portable 1 View  Result Date: 04/30/2023 CLINICAL DATA:  Vomiting EXAM: PORTABLE CHEST 1 VIEW COMPARISON:  11/04/2022 FINDINGS: Lungs are clear. No pneumothorax or pleural effusion. Right internal jugular hemodialysis catheter tip seen at the superior cavoatrial junction. 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 Cardiac size within normal limits. Pulmonary vascularity is normal IMPRESSION: 1. No active disease. Electronically Signed   By: Helyn Numbers M.D.   On: 04/30/2023 01:11    Dialysis Orders:  [facility] MWF 4:00 400/1.5x 74.4 kg 3K/2.5Ca TDC Heparin 3000  Mircera 75 q 2 weeks   - last 9/16  Calcitriol 0.75 three times per week   Assessment/Plan: Nausea/vomiting - H/o of gastroparesis. Getting IV meds in the ED. Per primary T1DM/Hyperglycemia - insulin per primary team   ESRD -  HD MWF. Continue on schedule. HD today.  Hypertension/volume  - BP elevated. On amlodipine 10, hydralazine 50 tid, metoprolol 50, olmesartan 20. Resume home meds as able. Given IVFs here.  Anemia  - Hgb 12.4 >10.8. Follow trends   Metabolic bone disease -  Calcium ok. Continue home meds when tolerating PO    Tomasa Blase PA-C [facility] 04/30/2023, 2:50 PM

## 2023-04-30 NOTE — ED Notes (Signed)
Pt bp elevated 238/123 advised MD Rancour, New orders placed.

## 2023-04-30 NOTE — Progress Notes (Signed)
Brief same day note:  Patient is a 52 year old female with history of type 1 diabetes, hypertension, gastroparesis, hyperlipidemia, dysphagia, anemia, ESRD on dialysis, coronary artery disease who presented with abdomen pain, intractable nausea and vomiting, not tolerating oral.  Takes insulin pump at home which was recently turned on because she was not having oral intake.  Also developed coffee-ground emesis, epigastric pain.  On presentation, she was very hypertensive.  Lab work showed glucose of 875, elevated lactate.  Insulin drip was started.    Patient seen and examined at the bedside this morning.  She remains hemodynamically stable.  Blood sugars have been better after starting on insulin drip.  She denies any abdomen pain, nausea or vomiting today.  Assessment and plan:  Intractable nausea and vomiting secondary to gastroparesis: Started on IV fluid, IV Protonix, Reglan. Most recent EGD on 10/23 showed esophagitis, GI not consulted. This is likely from gastroparesis.  Will advance her diet to full liquid abdomen is soft and nondistended and nontender today.  Denies abdomen, nausea or vomiting this morning.  Elevated lactate: On gentle IV fluids.  Sepsis not suspected  Severe hyperglycemia/type 1 diabetes: Started on insulin drip.  Continue IV fluids takes insulin pump at home.  Will check A1c.  Will DC drip and start on long-acting insulin.  Diabetic coronary consulted  Uncontrolled hypertension: Continue metoprolol, Imdur, hydralazine.  Monitor blood pressure.  Continue as needed for severe hypertension  Chronic combined systolic/diastolic CHF: History of nonischemic cardiomyopathy.  On Demadex, metoprolol, aspirin at home.  Appears euvolemic today  ESRD: On dialysis.  Nephrology consulted  Hyperlipidemia: On Zetia, pravastatin

## 2023-04-30 NOTE — ED Notes (Signed)
Ultrasound attempted by provider, unsuccessful. IV not at the bedside

## 2023-04-30 NOTE — ED Provider Notes (Signed)
  Physical Exam  BP (!) 215/128   Pulse 100   Temp 97.9 F (36.6 C) (Oral)   Resp 12   Ht 6' (1.829 m)   Wt 72.6 kg   LMP 11/17/2021 (Approximate)   SpO2 100%   BMI 21.70 kg/m   Physical Exam  Procedures  Ultrasound ED Peripheral IV (Provider)  Date/Time: 04/30/2023 2:07 AM  Performed by: Darrick Grinder, PA-C Authorized by: Darrick Grinder, PA-C   Procedure details:    Indications: poor IV access     Skin Prep: chlorhexidine gluconate     Location:  Left AC   Angiocath:  20 G   Bedside Ultrasound Guided: Yes     Images: archived     Patient tolerated procedure without complications: Yes     Dressing applied: No   Comments:     IV successful, patient began vomiting while trying to secure IV cath and IV dislodged   ED Course / MDM    Medical Decision Making Amount and/or Complexity of Data Reviewed Labs: ordered. Radiology: ordered.  Risk Prescription drug management.         [name] 04/30/23 1478    Glynn Octave, MD 04/30/23 [phone]

## 2023-04-30 NOTE — Inpatient Diabetes Management (Signed)
Inpatient Diabetes Program Recommendations  AACE/ADA: New Consensus Statement on Inpatient Glycemic Control (2015)  Target Ranges:  Prepandial:   less than 140 mg/dL      Peak postprandial:   less than 180 mg/dL (1-2 hours)      Critically ill patients:  140 - 180 mg/dL   Lab Results  Component Value Date   GLUCAP 160 (H) 04/30/2023   HGBA1C 7.5 (H) 10/05/2022    Review of Glycemic Control  Latest Reference Range & Units 04/30/23 06:34 04/30/23 07:15 04/30/23 08:17 04/30/23 09:22 04/30/23 10:29 04/30/23 11:35  Glucose-Capillary 70 - 99 mg/dL 409 (H) 811 (H) 914 (H) 217 (H) 201 (H) 160 (H)   Diabetes history: DM 2 Outpatient Diabetes medications: omnipod insulin pump Setting changed at recent Endocrinology visit to  1 unit for every 7 grams of carbohydrate 1 unit drops glucose 30 points (sensitivity)  Current orders for Inpatient glycemic control:  IV insulin/Endotool Endocrinologist: Talmage Coin, sees every 3 months A1c 8.4% in July/2024 Currently Insulin pump off and at home  Inpatient Diabetes Program Recommendations:    At time of transition consider: -   Semglee 25 units -   Novolog 0-15 units tid + hs (or Q4 if not eating) -   Novolog 4 units tid meal coverage with parameters if eating >50% of meals and glucose is at least 80 mg/dl  Spoke with pt at bedside regarding sick day guidelines and formulating a plan with her Endocrinologist at times when she is unable to eat. Pt does not have her insulin pump with her. Pt reports taking it off due to her not being able to eat. I told pt sick day guidelines are for her to check her glucose more frequently and take insulin regularly. Most of the time glucose trends increase instead of decrease. Encouraged her to follow up with Dr. Sharl Ma.  At time of discharge pt needs back up insulin Tresiba insulin pen  Fiasp insulin Insulin pen needles order # [id number]  Thanks,  Christena Deem RN, MSN, BC-ADM Inpatient Diabetes  Coordinator Team Pager [phone] (8a-5p)

## 2023-04-30 NOTE — ED Provider Notes (Signed)
[place] EMERGENCY DEPARTMENT AT [facility] Provider Note   CSN: [id number] Arrival date & time: 04/30/23  0009     History  Chief Complaint  Patient presents with   Abdominal Pain    [name] is a 52 y.o. female.  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  States around 3 PM today she developed abdominal pain with nausea and vomiting similar to previous episodes of gastroparesis a light able to keep anything down.  Since 8 PM her emesis it is brown and coffee grounds.  Denies any blood in her stool.  Denies taking any blood thinners.  Has not been able to take her blood pressure medications or insulin secondary to not being able to eat or drink.  She has been considered for a feeding tube in the past but does not want this.  She cannot take Reglan due to side effects. Most of her pain is in her mid abdomen.  Radiates to her abdomen diffusely.  Associated with severe nausea and vomiting.  No fever.  Does not make much urine.  Did have dialysis yesterday with no missed sessions.  Blood sugar too high to detect for EMS.  Hypertensive 209/140  The history is provided by the patient.  Abdominal Pain Associated symptoms: nausea and vomiting   Associated symptoms: no chest pain, no dysuria and no shortness of breath        Home Medications Prior to Admission medications   Medication Sig Start Date End Date Taking? Authorizing Provider  acetaminophen (TYLENOL) 500 MG tablet Take 1,000 mg by mouth every 8 (eight) hours as needed for moderate pain or mild pain.    [provider]  albuterol (PROAIR HFA) 108 (90 Base) MCG/ACT inhaler Inhale 2 puffs into the lungs every 6 (six) hours as needed for wheezing or shortness of breath. 07/03/20   Waldon Merl, PA-C  aspirin EC 81 MG tablet Take 1 tablet (81 mg total) by mouth daily. Swallow whole. 10/16/22   Hughie Closs, MD   Continuous Glucose Sensor (FREESTYLE LIBRE 3 SENSOR) MISC Change sensor every 14 days as directed 10/15/22   Talmage Coin, MD  ezetimibe (ZETIA) 10 MG tablet Take 1 tablet (10 mg total) by mouth daily. 12/09/22   Chandrasekhar, Rondel Jumbo, MD  FIASP 100 UNIT/ML SOLN 120 Units daily at 6 (six) AM. 12/03/22   [provider]  hydrALAZINE (APRESOLINE) 50 MG tablet Take 1.5 tablets (75 mg total) by mouth 3 (three) times daily. 12/10/22   Christell Constant, MD  hydrOXYzine (ATARAX) 50 MG tablet Take 50 mg by mouth 3 (three) times daily.    [provider]  insulin aspart (NOVOLOG) 100 UNIT/ML injection Inject 120 Units into the skin See admin instructions. Patient uses this with the Omnipod insulin pump  its vary according to blood sugar. Loads 120 units every 3 days.    [provider]  LORazepam (ATIVAN) 0.5 MG tablet Take 1 tablet (0.5 mg total) by mouth every 8 (eight) hours as needed for anxiety (Refractory nausea and vomiting). 07/17/22   Zannie Cove, MD  Methoxy PEG-Epoetin Beta (MIRCERA IJ) as directed. Kidney clinic 11/27/22 11/26/23  [provider]  metoCLOPramide (REGLAN) 5 MG tablet Take 5 mg by mouth every 8 (eight) hours as needed.    [provider]  metoprolol succinate (TOPROL-XL) 50 MG 24 hr tablet Take 1 tablet (50 mg total) by mouth  daily. Take with or immediately following a meal. 12/10/22   Chandrasekhar, Mahesh A, MD  nitroGLYCERIN (NITROSTAT) 0.4 MG SL tablet Place 1 tablet (0.4 mg total) under the tongue every 5 (five) minutes as needed for chest pain. 09/05/22 12/10/22  Christell Constant, MD  pantoprazole (PROTONIX) 40 MG tablet Take 1 tablet (40 mg total) by mouth 2 (two) times daily before a meal. Patient taking differently: Take 40 mg by mouth 2 (two) times daily as needed (heartburn). 05/24/22 12/10/22  Rai, Ripudeep K, MD  polyethylene glycol (MIRALAX / GLYCOLAX) 17 g packet Take 17 g by mouth daily as needed for moderate  constipation.    [provider]  pravastatin (PRAVACHOL) 40 MG tablet Take 1 tablet (40 mg total) by mouth every evening. 12/09/22   Chandrasekhar, Mahesh A, MD  pregabalin (LYRICA) 50 MG capsule Take 50 mg by mouth daily as needed (neuropathy). 12/05/21   [provider]  prochlorperazine (COMPAZINE) 10 MG tablet Take 1 tablet (10 mg total) by mouth every 6 (six) hours as needed for up to 7 days for nausea or vomiting. 11/05/22 12/10/22  Sherryll Burger, Pratik D, DO  promethazine (PHENERGAN) 25 MG suppository Unwrap and place 1 suppository (25 mg total) rectally every 6 (six) hours as needed for nausea or vomiting. 11/05/22   Sherryll Burger, Pratik D, DO  scopolamine (TRANSDERM-SCOP) 1 MG/3DAYS Place 1 patch (1.5 mg total) onto the skin every 3 (three) days. 05/26/22   Rai, Delene Ruffini, MD  silver sulfADIAZINE (SILVADENE) 1 % cream Apply 1 Application topically daily. Apply to affected area daily plus dry dressing 01/02/23   Nadara Mustard, MD  torsemide (DEMADEX) 20 MG tablet as needed (fluid). Patient not taking: Reported on 12/26/2022    [provider]      Allergies    Atorvastatin, Dulaglutide, Metoclopramide, Other, Sertraline, Vancomycin, Zofran [ondansetron], Amoxicillin, Lantus [insulin glargine], and Miconazole nitrate    Review of Systems   Review of Systems  Constitutional:  Positive for activity change and appetite change.  HENT:  Negative for congestion and rhinorrhea.   Respiratory:  Negative for chest tightness and shortness of breath.   Cardiovascular:  Negative for chest pain.  Gastrointestinal:  Positive for abdominal pain, nausea and vomiting.  Genitourinary:  Negative for dysuria.  Musculoskeletal:  Negative for arthralgias and myalgias.  Skin:  Negative for rash.  Neurological:  Negative for dizziness, weakness and headaches.   all other systems are negative except as noted in the HPI and PMH.    Physical Exam Updated Vital Signs BP (!) 209/140   Pulse (!) 104    Temp 98.7 F (37.1 C) (Oral)   Resp (!) 21   Ht 6' (1.829 m)   Wt 72.6 kg   LMP 11/17/2021 (Approximate)   SpO2 100%   BMI 21.70 kg/m  Physical Exam Vitals and nursing note reviewed.  Constitutional:      General: She is not in acute distress.    Appearance: She is well-developed. She is ill-appearing.     Comments: Chronic ill-appearing  HENT:     Head: Normocephalic and atraumatic.     Mouth/Throat:     Pharynx: No oropharyngeal exudate.  Eyes:     Conjunctiva/sclera: Conjunctivae normal.     Pupils: Pupils are equal, round, and reactive to light.  Neck:     Comments: No meningismus. Cardiovascular:     Rate and Rhythm: Regular rhythm. Tachycardia present.     Heart sounds: Normal heart sounds. No murmur  heard. Pulmonary:     Effort: Pulmonary effort is normal. No respiratory distress.     Breath sounds: Normal breath sounds.     Comments: Dialysis catheter right chest Abdominal:     Palpations: Abdomen is soft.     Tenderness: There is abdominal tenderness. There is no guarding or rebound.     Comments: Epigastric tenderness  Musculoskeletal:        General: No tenderness. Normal range of motion.     Cervical back: Normal range of motion and neck supple.  Skin:    General: Skin is warm.     Capillary Refill: Capillary refill takes less than 2 seconds.  Neurological:     General: No focal deficit present.     Mental Status: She is alert and oriented to person, place, and time. Mental status is at baseline.     Cranial Nerves: No cranial nerve deficit.     Motor: No abnormal muscle tone.     Coordination: Coordination normal.     Comments:  5/5 strength throughout. CN 2-12 intact.Equal grip strength.   Psychiatric:        Behavior: Behavior normal.     ED Results / Procedures / Treatments   Labs (all labs ordered are listed, but only abnormal results are displayed) Labs Reviewed  CBC WITH DIFFERENTIAL/PLATELET - Abnormal; Notable for the following components:       Result Value   RDW 16.1 (*)    All other components within normal limits  COMPREHENSIVE METABOLIC PANEL - Abnormal; Notable for the following components:   Sodium 130 (*)    Chloride 92 (*)    CO2 21 (*)    Glucose, Bld 875 (*)    BUN 34 (*)    Creatinine, Ser 4.97 (*)    AST 13 (*)    Total Bilirubin 2.1 (*)    GFR, Estimated 10 (*)    Anion gap 17 (*)    All other components within normal limits  LACTIC ACID, PLASMA - Abnormal; Notable for the following components:   Lactic Acid, Venous 4.2 (*)    All other components within normal limits  BETA-HYDROXYBUTYRIC ACID - Abnormal; Notable for the following components:   Beta-Hydroxybutyric Acid 2.24 (*)    All other components within normal limits  I-STAT CHEM 8, ED - Abnormal; Notable for the following components:   Sodium 133 (*)    BUN 35 (*)    Creatinine, Ser 5.10 (*)    Glucose, Bld >700 (*)    All other components within normal limits  I-STAT VENOUS BLOOD GAS, ED - Abnormal; Notable for the following components:   pH, Ven 7.446 (*)    pCO2, Ven 39.1 (*)    pO2, Ven 61 (*)    Acid-Base Excess 3.0 (*)    Sodium 130 (*)    Potassium 5.7 (*)    Calcium, Ion 1.09 (*)    All other components within normal limits  CBG MONITORING, ED - Abnormal; Notable for the following components:   Glucose-Capillary >600 (*)    All other components within normal limits  CBG MONITORING, ED - Abnormal; Notable for the following components:   Glucose-Capillary >600 (*)    All other components within normal limits  CBG MONITORING, ED - Abnormal; Notable for the following components:   Glucose-Capillary >600 (*)    All other components within normal limits  CBG MONITORING, ED - Abnormal; Notable for the following components:   Glucose-Capillary 549 (*)  All other components within normal limits  CBG MONITORING, ED - Abnormal; Notable for the following components:   Glucose-Capillary 515 (*)    All other components within normal  limits  CBG MONITORING, ED - Abnormal; Notable for the following components:   Glucose-Capillary 439 (*)    All other components within normal limits  TROPONIN I (HIGH SENSITIVITY) - Abnormal; Notable for the following components:   Troponin I (High Sensitivity) 137 (*)    All other components within normal limits  LACTIC ACID, PLASMA  PROTIME-INR  LIPASE, BLOOD  AMMONIA  OCCULT BLOOD GASTRIC / DUODENUM (SPECIMEN CUP)  BASIC METABOLIC PANEL  MAGNESIUM  PHOSPHORUS  PROTIME-INR  HEMOGLOBIN AND HEMATOCRIT, BLOOD  HEMOGLOBIN AND HEMATOCRIT, BLOOD  TYPE AND SCREEN  TROPONIN I (HIGH SENSITIVITY)    EKG EKG Interpretation Date/Time:  Wednesday April 30 2023 00:06:19 EDT Ventricular Rate:  101 PR Interval:  146 QRS Duration:  88 QT Interval:  365 QTC Calculation: 474 R Axis:   67  Text Interpretation: Sinus tachycardia Nonspecific T abnormalities, lateral leads Nonspecific T wave abnormality Confirmed by Glynn Octave [phone]) on 04/30/2023 12:16:25 AM  Radiology DG Abd Portable 2 Views  Result Date: 04/30/2023 CLINICAL DATA:  Abdominal pain, vomiting EXAM: PORTABLE ABDOMEN - 2 VIEW COMPARISON:  None Available. FINDINGS: The bowel gas pattern is normal. There is no evidence of free air. No radio-opaque calculi or other significant radiographic abnormality is seen. IMPRESSION: Negative. Electronically Signed   By: Helyn Numbers M.D.   On: 04/30/2023 01:16   DG Chest Portable 1 View  Result Date: 04/30/2023 CLINICAL DATA:  Vomiting EXAM: PORTABLE CHEST 1 VIEW COMPARISON:  11/04/2022 FINDINGS: Lungs are clear. No pneumothorax or pleural effusion. Right internal jugular hemodialysis catheter tip seen at the superior cavoatrial junction. 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 Cardiac size within normal limits. Pulmonary vascularity is normal IMPRESSION: 1. No active disease. Electronically Signed   By: Helyn Numbers M.D.   On: 04/30/2023 01:11     Procedures .Critical Care  Performed by: Glynn Octave, MD Authorized by: Glynn Octave, MD   Critical care provider statement:    Critical care time (minutes):  60   Critical care time was exclusive of:  Separately billable procedures and treating other patients   Critical care was necessary to treat or prevent imminent or life-threatening deterioration of the following conditions:  Endocrine crisis and metabolic crisis   Critical care was time spent personally by me on the following activities:  Development of treatment plan with patient or surrogate, discussions with consultants, evaluation of patient's response to treatment, examination of patient, ordering and review of laboratory studies, ordering and review of radiographic studies, ordering and performing treatments and interventions, pulse oximetry, re-evaluation of patient's condition, review of old charts, blood draw for specimens and obtaining history from patient or surrogate   I assumed direction of critical care for this patient from another provider in my specialty: no     Care discussed with: admitting provider       Medications Ordered in ED Medications  lactated ringers bolus 1,000 mL (has no administration in time range)  promethazine (PHENERGAN) 12.5 mg in sodium chloride 0.9 % 50 mL IVPB (has no administration in time range)  pantoprazole (PROTONIX) 80 mg /NS 100 mL IVPB (has no administration in time range)  pantoprozole (PROTONIX) 80 mg /NS 100 mL infusion (has no administration in time range)  pantoprazole (PROTONIX) injection 40 mg (has no administration in time range)  lactated ringers bolus 1,000 mL (has no administration in time range)    ED Course/ Medical Decision Making/ A&P                                 Medical Decision Making Amount and/or Complexity of Data Reviewed Independent Historian: EMS Labs: ordered. Decision-making details documented in ED Course. Radiology: ordered and  independent interpretation performed. Decision-making details documented in ED Course. ECG/medicine tests: ordered and independent interpretation performed. Decision-making details documented in ED Course.  Risk Prescription drug management. Decision regarding hospitalization.   ESRD patient, type I diabetic with gastroparesis, arrives tachycardic, hypertensive, hyperglycemic.  Concern for gastroparesis exacerbation with brown emesis.  Patient very hypertensive.  Has not had her medications for several days.  Complains of epigastric pain similar previous episodes of gastroparesis.  Labs to be obtained.  Do show hyperglycemia without evidence of DKA.  Hemoglobin is stable.  No significant leukocytosis.  X-ray negative for free air or bowel obstruction.  Blood sugars 875.  Anion gap is normal.  No evidence of DKA.  Lactate is normal. Hemoglobin is stable.  Blood pressure has improved after IV hydralazine.  Nausea has improved after droperidol, Ativan, Phenergan, Compazine.  She states she cannot take Reglan.  Abdomen soft without peritoneal signs.  Continue IV insulin, IV Protonix, IV fluids.  Will need admission for intractable nausea and vomiting with hyper tensive urgency and hyperglycemia.  Blood pressure has improved with medications in the ED.  Systolic 160s.  Patient resting comfortably with improvement to her nausea and vomiting. Abdomen soft without peritoneal signs. Will hold CT imaging at this time.  Continue IV hydration, IV insulin, IV Protonix.  No evidence of significant GI bleed.  Last EGD showed esophagitis. Discussed with Dr. Joneen Roach        Final Clinical Impression(s) / ED Diagnoses Final diagnoses:  Intractable nausea and vomiting  Hypertensive urgency  Hyperglycemia    Rx / DC Orders ED Discharge Orders     None         Mayline Dragon, Jeannett Senior, MD 04/30/23 [phone]

## 2023-05-01 ENCOUNTER — Other Ambulatory Visit (HOSPITAL_COMMUNITY): Payer: Self-pay

## 2023-05-01 DIAGNOSIS — I10 Essential (primary) hypertension: Secondary | ICD-10-CM

## 2023-05-01 DIAGNOSIS — E1065 Type 1 diabetes mellitus with hyperglycemia: Secondary | ICD-10-CM | POA: Diagnosis not present

## 2023-05-01 DIAGNOSIS — N186 End stage renal disease: Secondary | ICD-10-CM | POA: Diagnosis not present

## 2023-05-01 DIAGNOSIS — E1069 Type 1 diabetes mellitus with other specified complication: Secondary | ICD-10-CM | POA: Diagnosis not present

## 2023-05-01 LAB — BASIC METABOLIC PANEL
Anion gap: 13 (ref 5–15)
BUN: 20 mg/dL (ref 6–20)
CO2: 23 mmol/L (ref 22–32)
Calcium: 8.4 mg/dL — ABNORMAL LOW (ref 8.9–10.3)
Chloride: 98 mmol/L (ref 98–111)
Creatinine, Ser: 4.58 mg/dL — ABNORMAL HIGH (ref 0.44–1.00)
GFR, Estimated: 11 mL/min — ABNORMAL LOW (ref >60)
Glucose, Bld: 226 mg/dL — ABNORMAL HIGH (ref 70–99)
Potassium: 3.4 mmol/L — ABNORMAL LOW (ref 3.5–5.1)
Sodium: 134 mmol/L — ABNORMAL LOW (ref 135–145)

## 2023-05-01 LAB — CBC
HCT: 30.6 % — ABNORMAL LOW (ref 36.0–46.0)
Hemoglobin: 10.1 g/dL — ABNORMAL LOW (ref 12.0–15.0)
MCH: 29.3 pg (ref 26.0–34.0)
MCHC: 33 g/dL (ref 30.0–36.0)
MCV: 88.7 fL (ref 80.0–100.0)
Platelets: 134 10*3/uL — ABNORMAL LOW (ref 150–400)
RBC: 3.45 MIL/uL — ABNORMAL LOW (ref 3.87–5.11)
RDW: 16.6 % — ABNORMAL HIGH (ref 11.5–15.5)
WBC: 9.5 10*3/uL (ref 4.0–10.5)
nRBC: 0 % (ref 0.0–0.2)

## 2023-05-01 LAB — GLUCOSE, CAPILLARY
Glucose-Capillary: 261 mg/dL — ABNORMAL HIGH (ref 70–99)
Glucose-Capillary: 154 mg/dL — ABNORMAL HIGH (ref 70–99)
Glucose-Capillary: 124 mg/dL — ABNORMAL HIGH (ref 70–99)

## 2023-05-01 LAB — LACTIC ACID, PLASMA: Lactic Acid, Venous: 0.9 mmol/L (ref 0.5–1.9)

## 2023-05-01 MED ORDER — HEPARIN SODIUM (PORCINE) 1000 UNIT/ML IJ SOLN
4000.0000 [IU] | Freq: Once | INTRAMUSCULAR | Status: AC
  Administered 2023-05-01: 4000 [IU]
  Filled 2023-05-01: qty 4

## 2023-05-01 MED ORDER — TRAMADOL HCL 50 MG PO TABS: 25.0000 mg | ORAL_TABLET | Freq: Two times a day (BID) | ORAL | Status: DC | PRN

## 2023-05-01 MED ORDER — PANTOPRAZOLE SODIUM 40 MG PO TBEC: 40.0000 mg | DELAYED_RELEASE_TABLET | Freq: Every day | ORAL | Status: DC

## 2023-05-01 MED ORDER — PROCHLORPERAZINE MALEATE 5 MG PO TABS
5.0000 mg | ORAL_TABLET | Freq: Four times a day (QID) | ORAL | 0 refills | Status: AC | PRN
  Filled 2023-05-01: qty 15, 4d supply, fill #0

## 2023-05-01 MED ORDER — INSULIN ASPART 100 UNIT/ML IJ SOLN: 120.0000 [IU] | INTRAMUSCULAR | Status: AC

## 2023-05-01 MED ORDER — PROCHLORPERAZINE EDISYLATE 10 MG/2ML IJ SOLN
5.0000 mg | Freq: Three times a day (TID) | INTRAMUSCULAR | Status: DC
  Administered 2023-05-01: 5 mg via INTRAVENOUS
  Filled 2023-05-01: qty 2

## 2023-05-01 NOTE — Inpatient Diabetes Management (Signed)
Inpatient Diabetes Program Recommendations  AACE/ADA: New Consensus Statement on Inpatient Glycemic Control (2015)  Target Ranges:  Prepandial:   less than 140 mg/dL      Peak postprandial:   less than 180 mg/dL (1-2 hours)      Critically ill patients:  140 - 180 mg/dL   Lab Results  Component Value Date   GLUCAP 154 (H) 05/01/2023   HGBA1C 11.7 (H) 04/30/2023    Patient may possibly discharge today.  She received Semglee 20 units this morning.  Spoke with her at bedside.  She will wait until tomorrow morning  to resume her insulin pump.  She can connect to her insulin pump for boluses and correction today.  Patient verbalizes understanding.    Will continue to follow while inpatient.  Thank you, Dulce Sellar, MSN, CDCES Diabetes Coordinator Inpatient Diabetes Program [phone] (team pager from 8a-5p)

## 2023-05-01 NOTE — Progress Notes (Signed)
Mobility Specialist Progress Note;   05/01/23 1415  Mobility  Activity Stood at bedside  Level of Assistance Contact guard assist, steadying assist  Assistive Device None  Activity Response Tolerated fair  Mobility Referral Yes  $Mobility charge 1 Mobility  Mobility Specialist Start Time (ACUTE ONLY) 1415  Mobility Specialist Stop Time (ACUTE ONLY) 1425  Mobility Specialist Time Calculation (min) (ACUTE ONLY) 10 min    Pre-mobility: Sitting BP 115/79 (91) During-mobility: Standing BP 144/107 (119) Post-mobility: Laying BP 119/62 (79)  Pt initially agreeable to mobility. Upon standing, pt c/o dizziness and feeling light headed. Pt unable to stand for 3 min BP. Stated "I feel like I am about to pass out". Further mobility deferred. RN notified.  [name] Mobility Specialist Please contact via SecureChat or Rehab Office [phone]

## 2023-05-01 NOTE — Plan of Care (Signed)

## 2023-05-01 NOTE — Progress Notes (Addendum)
   05/01/23 0223  Vitals  Temp 98 F (36.7 C)  Temp Source Oral  BP (!) 88/66  MAP (mmHg) 74  BP Location Right Leg  BP Method Automatic  Patient Position (if appropriate) Lying  Pulse Rate 80  ECG Heart Rate 83  Resp 20  Oxygen Therapy  SpO2 100 %  O2 Device Nasal Cannula  During Treatment Monitoring  Blood Flow Rate (mL/min) 270 mL/min  Arterial Pressure (mmHg) -210 mmHg  Venous Pressure (mmHg) 120 mmHg  TMP (mmHg) 15 mmHg  Ultrafiltration Rate (mL/min) 600 mL/min  Dialysate Flow Rate (mL/min) 300 ml/min  Dialysate Potassium Concentration 3  Dialysate Calcium Concentration 2.5  HD Safety Checks Performed Yes  Intra-Hemodialysis Comments Tx completed  Post Treatment  Dialyzer Clearance Lightly streaked  Liters Processed 42.2  Fluid Removed (mL) 0.6 mL  Post-Hemodialysis Comments pt request   Pt had  sudden headache scale 8. Pt request to discontinue treatment with 25 minutes left, Tylenol 650 by mouth given. Headache declined. Report given to Nurse Burnell Blanks RN

## 2023-05-01 NOTE — Progress Notes (Addendum)
[facility] Progress Note   Subjective: Seen in room. Completed dialysis overnight. Nausea improved. Feeling a little better today and wants to eat.   Objective Vitals:   05/01/23 0215 05/01/23 0223 05/01/23 0400 05/01/23 0800  BP: (!) 89/57 (!) 88/66 (!) 151/68 134/67  Pulse: 83 80 87   Resp: 20 20 16    Temp:  98 F (36.7 C) 97.9 F (36.6 C) 98.6 F (37 C)  TempSrc:  Oral Oral Oral  SpO2: 100% 100% 98%   Weight:      Height:         Additional Objective Labs: Basic Metabolic Panel: Recent Labs  Lab 04/30/23 0140 04/30/23 0155 04/30/23 0156 04/30/23 1819  NA 130* 133* 130* 137  K 3.7 3.8 5.7* 3.4*  CL 92* 98  --  103  CO2 21*  --   --  23  GLUCOSE 875* >700*  --  309*  BUN 34* 35*  --  32*  CREATININE 4.97* 5.10*  --  5.25*  CALCIUM 9.5  --   --  8.8*  PHOS  --   --   --  3.8   CBC: Recent Labs  Lab 04/30/23 0140 04/30/23 0155 04/30/23 0156 04/30/23 1127  WBC 7.7  --   --   --   NEUTROABS 6.4  --   --   --   HGB 12.4 13.9 12.9 10.8*  HCT 37.0 41.0 38.0 32.7*  MCV 89.4  --   --   --   PLT 153  --   --   --    Blood Culture    Component Value Date/Time   SDES BLOOD RIGHT ANTECUBITAL 10/21/2022 1856   SPECREQUEST  10/21/2022 1856    BOTTLES DRAWN AEROBIC AND ANAEROBIC Blood Culture results may not be optimal due to an inadequate volume of blood received in culture bottles   CULT  10/21/2022 1856    NO GROWTH 5 DAYS Performed at [facility], [address] [place], [place] [address]    REPTSTATUS 10/26/2022 FINAL 10/21/2022 1856     Physical Exam General: Chronically ill appearing Heart: Regular rate Lungs: Clear normal wob Abdomen: soft Extremities: Trace LE edema Dialysis Access: TDC; LUE AVG non functional   Medications:  pantoprazole 8 mg/hr (05/01/23 0625)    amLODipine  10 mg Oral Daily   Chlorhexidine Gluconate Cloth  6 each Topical Q0600   hydrALAZINE  75 mg Oral TID   insulin aspart  0-15 Units  Subcutaneous TID WC   insulin aspart  0-5 Units Subcutaneous QHS   insulin glargine-yfgn  20 Units Subcutaneous Daily   isosorbide dinitrate  20 mg Oral Daily   metoprolol succinate  50 mg Oral Daily   [START ON 05/03/2023] pantoprazole  40 mg Intravenous Q12H   prochlorperazine  5 mg Intravenous TID AC   scopolamine  1 patch Transdermal Q72H    Dialysis Orders:  [facility] M[facility] 4:00 400/1.5x 74.4 kg 3K/2.5Ca TDC Heparin 3000  Mircera 75 q 2 weeks   - last 9/16  Calcitriol 0.75 three times per week    Assessment/Plan: Nausea/vomiting - H/o of gastroparesis. On IV Protonix  per primary team. Improved today. T1DM/Hyperglycemia - insulin per primary team   ESRD -  HD MWF. Continue on schedule. Next HD 9/27  Hypertension/volume  - BP better. Volume ok. On amlodipine 10, hydralazine 50 tid, metoprolol 50, olmesartan 20 at home. Received IVFs here and under dry weight.  Anemia  - Hgb  12.4 >10.8. Follow trends  Metabolic bone disease -  Calcium ok. Continue home meds when tolerating PO   Tomasa Blase PA-C [facility] 05/01/2023,10:42 AM

## 2023-05-01 NOTE — Progress Notes (Signed)
Brief note Was apparently dizzy when she attempted to mobilize earlier,discharge briefly hld-subsequently evaluated patient-she had very brief dizziness-that resolved in a few minutes of sitting and standing-she was then able to ambulate in the hallway with the mobility team. She is anxious to get out of the hospital today-and is requesting d/c

## 2023-05-01 NOTE — Progress Notes (Signed)
D/C order noted. Contacted [facility] to advise clinic of pt's d/c today and that pt should resume care tomorrow.   Leydy Worthey Renal Navigator [phone] 

## 2023-05-01 NOTE — Plan of Care (Signed)
[facility] Patient Discharge Orders- [facility] CLINIC: [place]  Patient's name: [name] Admit/DC Dates: 04/30/2023 - 05/01/23 Observation  Discharge Diagnoses: Nausea/vomiting 2/2 gastroparesis  Hyperglycemia/T1DM   Aranesp: Given: --   Date and amount of last dose: --  Last Hgb: 10.1 PRBC's Given: -- Date/# of units: -- ESA dose for discharge: No change IV Iron dose at discharge: --  Heparin change: --  EDW Change:  New EDW:  73 kg   [place]ath Change: --  Access intervention/Change: --   Hectorol/Calcitriol change: --  Discharge Labs: Calcium 8.4 Phosphorus -- Albumin -- K+ 3.4  IV Antibiotics: --   On Coumadin: --    OTHER/APPTS/LAB ORDERS:    D/C Meds to be reconciled by nurse after every discharge.  Completed By: Tomasa Blase PA-C    Reviewed by: MD:______ RN_______

## 2023-05-01 NOTE — Progress Notes (Signed)
   05/01/23 0942  TOC Brief Assessment  Insurance and Status Reviewed (BCBS COMM/PPO)  Patient has primary care physician Yes ([place], [place], [place])  Home environment has been reviewed from home  Prior level of function: independent   uses insulin pump  Prior/Current Home Services No current home services  Readmission risk has been reviewed Yes (41%)  Transition of care needs transition of care needs identified, TOC will continue to follow (Needs resources for SDOH)

## 2023-05-01 NOTE — TOC Transition Note (Signed)
Transition of Care [facility]) - CM/SW Discharge Note   Patient Details  Name: [name] MRN: [id number] Date of Birth: 17-Apr-1971  Transition of Care [facility]) CM/SW Contact:  Gordy Clement, RN Phone Number: 05/01/2023, 1:56 PM   Clinical Narrative:     Patient will DC to home and follow up as directed on AVS.  Cab voucher was given as patient has no transportation to home .           Patient Goals and CMS Choice      Discharge Placement                         Discharge Plan and Services Additional resources added to the After Visit Summary for                                       Social Determinants of Health (SDOH) Interventions SDOH Screenings   Food Insecurity: No Food Insecurity (12/26/2022)  Housing: Low Risk  (12/26/2022)  Transportation Needs: No Transportation Needs (12/26/2022)  Utilities: Not At Risk (12/26/2022)  Alcohol Screen: Low Risk  (07/15/2022)  Depression (PHQ2-9): High Risk (12/26/2022)  Financial Resource Strain: Medium Risk (12/26/2022)  Physical Activity: Inactive (12/26/2022)  Social Connections: Socially Isolated (12/26/2022)  Stress: Stress Concern Present (01/07/2023)  Tobacco Use: Low Risk  (04/30/2023)     Readmission Risk Interventions    10/09/2022    3:27 PM 10/09/2022    3:25 PM 09/10/2022   10:49 AM  Readmission Risk Prevention Plan  Transportation Screening  Complete Complete  Medication Review Oceanographer)  Complete Complete  PCP or Specialist appointment within 3-5 days of discharge  Complete Complete  HRI or Home Care Consult  Complete Complete  SW Recovery Care/Counseling Consult -- Complete Complete  Palliative Care Screening  Not Applicable Not Applicable  Skilled Nursing Facility  Not Applicable Not Applicable

## 2023-05-01 NOTE — Progress Notes (Signed)
Discharge teach ing completed. Patient discharging home with toc meds and cab voucher. Patient denies complaints.

## 2023-05-01 NOTE — Progress Notes (Signed)
Mobility Specialist Progress Note;   05/01/23 1435  Mobility  Activity Ambulated with assistance in hallway  Level of Assistance Contact guard assist, steadying assist  Assistive Device Four wheel walker  Distance Ambulated (ft) 300 ft  Activity Response Tolerated well  Mobility Referral Yes  $Mobility charge 1 Mobility  Mobility Specialist Start Time (ACUTE ONLY) 1435  Mobility Specialist Stop Time (ACUTE ONLY) 1445  Mobility Specialist Time Calculation (min) (ACUTE ONLY) 10 min   MD requested mobility to re-attempt. Pt denies dizziness, light headedness throughout ambulation. Ambulated with MinG assistance, with no unsteadiness noted. Pt left in bed with all needs met.  [name] Mobility Specialist Please contact via SecureChat or Rehab Office [phone]

## 2023-05-01 NOTE — Discharge Summary (Signed)
PATIENT DETAILS Name: [name] Age: 52 y.o. Sex: female Date of Birth: 1971/03/28 MRN: [id number]. Admitting Physician: Megan Pray, MD WUJ:[id number], [name], [place]  Admit Date: 04/30/2023 Discharge date: 05/01/2023  Recommendations for Outpatient Follow-up:  Follow up with PCP in 1-2 weeks Please obtain CMP/CBC in one week  Admitted From:  Home  Disposition: Home   Discharge Condition: good  CODE STATUS:   Code Status: Full Code   Diet recommendation:  Diet Order             Diet renal/carb modified with fluid restriction Fluid restriction: 1200 mL Fluid; Fluid consistency: Thin  Diet effective now           Diet - low sodium heart healthy           Diet Carb Modified                    Brief Summary: 52 year old with history of ESRD on HD DM-1-on insulin pump-gastroparesis who presented with intractable nausea/vomiting secondary to gastroparesis flare in the setting of uncontrolled hyperglycemia.  Brief Hospital Course: Gastroparesis flare Managed with antiemetics-and small portion meals Diet gradually advanced-by day of discharge-tolerating regular diet. Patient is apparently nontolerant to Reglan-questionable side effect of tardive dyskinesia in the past Will continue as needed Compazine on discharge.  Hypoglycemic nonketotic state  Managed with IV insulin infusion-has already been transitioned to basal subcutaneous insulin   DM-1  Normally on a insulin pump-she was unable to get her family members to get the pump in the hospital today-she was given a long-acting insulin this morning-CBGs are stable-she will start using the pump from tomorrow.  Appreciate diabetic coordinator evaluation. Patient is aware/very familiar with this issue-she occasionally will take her insulin pump off for 1 reason or the other at home and will use Lantus.   ESRD on HD Nephrology followed closely-she underwent hemodialysis earlier this morning She is to resume her  prior hemodialysis schedule.  Rest of medical problems were stable during the short overnight hospitalization.  BMI: Estimated body mass index is 21.7 kg/m as calculated from the following:   Height as of this encounter: 6' (1.829 m).   Weight as of this encounter: 72.6 kg.    Discharge Diagnoses:  Principal Problem:   Type 1 diabetes mellitus with hyperosmolar hyperglycemic state (HHS) (HCC) Active Problems:   Intractable nausea and vomiting secondary to diabetic gastroparesis   Lactic acidosis   ESRD (end stage renal disease) (HCC)   Hypertension, uncontrolled   History of esophagitis   Hypertensive urgency   Protein-calorie malnutrition, severe   Mixed hyperlipidemia   Hematemesis   Chronic combined systolic and diastolic heart failure (HCC)   Hyponatremia   Discharge Instructions:  Activity:  As tolerated   Discharge Instructions     Call MD for:  persistant nausea and vomiting   Complete by: As directed    Diet - low sodium heart healthy   Complete by: As directed    Diet Carb Modified   Complete by: As directed    Discharge instructions   Complete by: As directed    Follow with Primary MD  Megan Officer, PA in 1-2 weeks  Resume your insulin pump starting tomorrow-you were given a long-acting insulin today.  Small portion meals  Please get a complete blood count and chemistry panel checked by your Primary MD at your next visit, and again as instructed by your Primary MD.  Get Medicines reviewed and adjusted: Please take  all your medications with you for your next visit with your Primary MD  Laboratory/radiological data: Please request your Primary MD to go over all hospital tests and procedure/radiological results at the follow up, please ask your Primary MD to get all Hospital records sent to his/her office.  In some cases, they will be blood work, cultures and biopsy results pending at the time of your discharge. Please request that your primary  care M.D. follows up on these results.  Also Note the following: If you experience worsening of your admission symptoms, develop shortness of breath, life threatening emergency, suicidal or homicidal thoughts you must seek medical attention immediately by calling 911 or calling your MD immediately  if symptoms less severe.  You must read complete instructions/literature along with all the possible adverse reactions/side effects for all the Medicines you take and that have been prescribed to you. Take any new Medicines after you have completely understood and accpet all the possible adverse reactions/side effects.   Do not drive when taking Pain medications or sleeping medications (Benzodaizepines)  Do not take more than prescribed Pain, Sleep and Anxiety Medications. It is not advisable to combine anxiety,sleep and pain medications without talking with your primary care practitioner  Special Instructions: If you have smoked or chewed Tobacco  in the last 2 yrs please stop smoking, stop any regular Alcohol  and or any Recreational drug use.  Wear Seat belts while driving.  Please note: You were cared for by a hospitalist during your hospital stay. Once you are discharged, your primary care physician will handle any further medical issues. Please note that NO REFILLS for any discharge medications will be authorized once you are discharged, as it is imperative that you return to your primary care physician (or establish a relationship with a primary care physician if you do not have one) for your post hospital discharge needs so that they can reassess your need for medications and monitor your lab values.   Increase activity slowly   Complete by: As directed       Allergies as of 05/01/2023       Reactions   Atorvastatin Other (See Comments)   Dizziness    Dulaglutide Nausea And Vomiting, Other (See Comments)   Caused pancreatitis    Metoclopramide Other (See Comments)   Possible movement  disorder or tardive dyskinesia while on reglan Long term   Other Itching   Peas and carrots   Sertraline Nausea And Vomiting   Vancomycin Itching, Swelling   Zofran [ondansetron] Nausea And Vomiting, Other (See Comments)   Causes nausea vomiting to worsen / doesn't really work for patient.   Amoxicillin Itching, Rash   Lantus [insulin Glargine] Itching, Rash   Has tolerated insulin glargine many times with no reported issues   Miconazole Nitrate Nausea And Vomiting, Rash        Medication List     TAKE these medications    acetaminophen 500 MG tablet Commonly known as: TYLENOL Take 1,000 mg by mouth every 8 (eight) hours as needed for moderate pain or mild pain.   albuterol 108 (90 Base) MCG/ACT inhaler Commonly known as: ProAir HFA Inhale 2 puffs into the lungs every 6 (six) hours as needed for wheezing or shortness of breath.   amLODipine 10 MG tablet Commonly known as: NORVASC Take 10 mg by mouth daily.   Aspirin Low Dose 81 MG tablet Generic drug: aspirin EC Take 1 tablet (81 mg total) by mouth daily. Swallow whole.  cloNIDine 0.1 mg/24hr patch Commonly known as: CATAPRES - Dosed in mg/24 hr Place 0.1 mg onto the skin once a week.   ezetimibe 10 MG tablet Commonly known as: ZETIA Take 1 tablet (10 mg total) by mouth daily.   Fiasp 100 UNIT/ML Soln Generic drug: Insulin Aspart (w/Niacinamide) 120 Units daily at 6 (six) AM.   FreeStyle Libre 3 Sensor Misc Change sensor every 14 days as directed   hydrALAZINE 50 MG tablet Commonly known as: APRESOLINE Take 1.5 tablets (75 mg total) by mouth 3 (three) times daily.   hydrOXYzine 50 MG tablet Commonly known as: ATARAX Take 50 mg by mouth 3 (three) times daily as needed for anxiety.   insulin aspart 100 UNIT/ML injection Commonly known as: novoLOG Inject 120 Units into the skin See admin instructions. Patient uses this with the Omnipod insulin pump  its vary according to blood sugar. Loads 120 units every 3  days. Start taking on: May 02, 2023   isosorbide dinitrate 20 MG tablet Commonly known as: ISORDIL Take 20 mg by mouth.   LORazepam 0.5 MG tablet Commonly known as: ATIVAN Take 1 tablet (0.5 mg total) by mouth every 8 (eight) hours as needed for anxiety (Refractory nausea and vomiting).   metoCLOPramide 5 MG tablet Commonly known as: REGLAN Take 5 mg by mouth every 8 (eight) hours as needed.   metoprolol succinate 50 MG 24 hr tablet Commonly known as: TOPROL-XL Take 1 tablet (50 mg total) by mouth daily. Take with or immediately following a meal. What changed:  when to take this reasons to take this   MIRCERA IJ as directed. [facility] clinic   nitroGLYCERIN 0.4 MG SL tablet Commonly known as: NITROSTAT Place 1 tablet (0.4 mg total) under the tongue every 5 (five) minutes as needed for chest pain.   olmesartan 20 MG tablet Commonly known as: BENICAR Take 20 mg by mouth daily.   Omnipod DASH Pods (Gen 4) Misc Place 1 Applicatorful onto the skin See admin instructions. Every three days   pantoprazole 40 MG tablet Commonly known as: Protonix Take 1 tablet (40 mg total) by mouth 2 (two) times daily before a meal. What changed:  when to take this reasons to take this   polyethylene glycol 17 g packet Commonly known as: MIRALAX / GLYCOLAX Take 17 g by mouth daily as needed for moderate constipation.   pravastatin 40 MG tablet Commonly known as: PRAVACHOL Take 1 tablet (40 mg total) by mouth every evening.   pregabalin 50 MG capsule Commonly known as: LYRICA Take 50 mg by mouth daily as needed (neuropathy).   prochlorperazine 5 MG tablet Commonly known as: COMPAZINE Take 1 tablet (5 mg total) by mouth every 6 (six) hours as needed for nausea or vomiting.   scopolamine 1 MG/3DAYS Commonly known as: TRANSDERM-SCOP Place 1 patch (1.5 mg total) onto the skin every 3 (three) days.   tobramycin 0.3 % ophthalmic solution Commonly known as: TOBREX SMARTSIG:In  Eye(s)   torsemide 20 MG tablet Commonly known as: DEMADEX Take 20 mg by mouth daily.   Vitamin D (Ergocalciferol) 1.25 MG (50000 UNIT) Caps capsule Commonly known as: DRISDOL Take 50,000 Units by mouth once a week.        Follow-up Information     [name], [place]. Schedule an appointment as soon as possible for a visit in 1 week(s).   Specialty: Internal Medicine Contact information: [address] [place] [address] [phone]  Hemodialysis clinic Follow up.   Why: Please follow at your usual schedule.               Allergies  Allergen Reactions   Atorvastatin Other (See Comments)    Dizziness    Dulaglutide Nausea And Vomiting and Other (See Comments)    Caused pancreatitis    Metoclopramide Other (See Comments)    Possible movement disorder or tardive dyskinesia while on reglan Long term   Other Itching    Peas and carrots   Sertraline Nausea And Vomiting   Vancomycin Itching and Swelling   Zofran [Ondansetron] Nausea And Vomiting and Other (See Comments)    Causes nausea vomiting to worsen / doesn't really work for patient.   Amoxicillin Itching and Rash   Lantus [Insulin Glargine] Itching and Rash    Has tolerated insulin glargine many times with no reported issues   Miconazole Nitrate Nausea And Vomiting and Rash     Other Procedures/Studies: DG Abd Portable 2 Views  Result Date: 04/30/2023 CLINICAL DATA:  Abdominal pain, vomiting EXAM: PORTABLE ABDOMEN - 2 VIEW COMPARISON:  None Available. FINDINGS: The bowel gas pattern is normal. There is no evidence of free air. No radio-opaque calculi or other significant radiographic abnormality is seen. IMPRESSION: Negative. Electronically Signed   By: Helyn Numbers M.D.   On: 04/30/2023 01:16   DG Chest Portable 1 View  Result Date: 04/30/2023 CLINICAL DATA:  Vomiting EXAM: PORTABLE CHEST 1 VIEW COMPARISON:  11/04/2022 FINDINGS: Lungs are clear. No pneumothorax or  pleural effusion. Right internal jugular hemodialysis catheter tip seen at the superior cavoatrial junction. 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 Cardiac size within normal limits. Pulmonary vascularity is normal IMPRESSION: 1. No active disease. Electronically Signed   By: Helyn Numbers M.D.   On: 04/30/2023 01:11     TODAY-DAY OF DISCHARGE:  Subjective:   [name] today has no headache,no chest abdominal pain,no new weakness tingling or numbness, feels much better wants to go home today.   Objective:   Blood pressure 134/67, pulse 87, temperature 98.6 F (37 C), temperature source Oral, resp. rate 16, height 6' (1.829 m), weight 72.6 kg, last menstrual period 11/17/2021, SpO2 98%.  Intake/Output Summary (Last 24 hours) at 05/01/2023 1400 Last data filed at 05/01/2023 1300 Gross per 24 hour  Intake 517.48 ml  Output 0.6 ml  Net 516.88 ml   Filed Weights   04/30/23 0014  Weight: 72.6 kg    Exam: Awake Alert, Oriented *3, No new F.N deficits, Normal affect [place].AT,PERRAL Supple Neck,No JVD, No cervical lymphadenopathy appriciated.  Symmetrical Chest wall movement, Good air movement bilaterally, CTAB RRR,No Gallops,Rubs or new Murmurs, No Parasternal Heave +ve B.Sounds, Abd Soft, Non tender, No organomegaly appriciated, No rebound -guarding or rigidity. No Cyanosis, Clubbing or edema, No new Rash or bruise   PERTINENT RADIOLOGIC STUDIES: DG Abd Portable 2 Views  Result Date: 04/30/2023 CLINICAL DATA:  Abdominal pain, vomiting EXAM: PORTABLE ABDOMEN - 2 VIEW COMPARISON:  None Available. FINDINGS: The bowel gas pattern is normal. There is no evidence of free air. No radio-opaque calculi or other significant radiographic abnormality is seen. IMPRESSION: Negative. Electronically Signed   By: Helyn Numbers M.D.   On: 04/30/2023 01:16   DG Chest Portable 1 View  Result Date: 04/30/2023 CLINICAL DATA:  Vomiting EXAM: PORTABLE CHEST 1 VIEW COMPARISON:   11/04/2022 FINDINGS: Lungs are clear. No pneumothorax or pleural effusion. Right internal jugular hemodialysis catheter tip seen at the superior  cavoatrial junction. 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 Cardiac size within normal limits. Pulmonary vascularity is normal IMPRESSION: 1. No active disease. Electronically Signed   By: Helyn Numbers M.D.   On: 04/30/2023 01:11     PERTINENT LAB RESULTS: CBC: Recent Labs    04/30/23 0140 04/30/23 0155 04/30/23 1127 05/01/23 0806  WBC 7.7  --   --  9.5  HGB 12.4   < > 10.8* 10.1*  HCT 37.0   < > 32.7* 30.6*  PLT 153  --   --  134*   < > = values in this interval not displayed.   CMET CMP     Component Value Date/Time   NA 134 (L) 05/01/2023 0806   NA 139 11/14/2021 1207   K 3.4 (L) 05/01/2023 0806   CL 98 05/01/2023 0806   CO2 23 05/01/2023 0806   GLUCOSE 226 (H) 05/01/2023 0806   BUN 20 05/01/2023 0806   BUN 21 11/14/2021 1207   CREATININE 4.58 (H) 05/01/2023 0806   CALCIUM 8.4 (L) 05/01/2023 0806   PROT 7.7 04/30/2023 0140   PROT 5.5 (L) 11/24/2019 0934   ALBUMIN 3.6 04/30/2023 0140   ALBUMIN 3.0 (L) 11/24/2019 0934   AST 13 (L) 04/30/2023 0140   ALT 12 04/30/2023 0140   ALKPHOS 92 04/30/2023 0140   BILITOT 2.1 (H) 04/30/2023 0140   BILITOT 0.3 11/24/2019 0934   GFR 77.86 08/30/2020 1604   EGFR 35 (L) 11/14/2021 1207   GFRNONAA 11 (L) 05/01/2023 0806    GFR Estimated Creatinine Clearance: 16.5 mL/min (A) (by C-G formula based on SCr of 4.58 mg/dL (H)). Recent Labs    04/30/23 0140  LIPASE 27   No results for input(s): "CKTOTAL", "CKMB", "CKMBINDEX", "TROPONINI" in the last 72 hours. Invalid input(s): "POCBNP" No results for input(s): "DDIMER" in the last 72 hours. Recent Labs    04/30/23 1128 04/30/23 1725  HGBA1C 11.4* 11.7*   No results for input(s): "CHOL", "HDL", "LDLCALC", "TRIG", "CHOLHDL", "LDLDIRECT" in the last 72 hours. No results for input(s): "TSH", "T4TOTAL",  "T3FREE", "THYROIDAB" in the last 72 hours.  Invalid input(s): "FREET3" No results for input(s): "VITAMINB12", "FOLATE", "FERRITIN", "TIBC", "IRON", "RETICCTPCT" in the last 72 hours. Coags: Recent Labs    04/30/23 0140 04/30/23 1038  INR 1.0 1.2   Microbiology: No results found for this or any previous visit (from the past 240 hour(s)).  FURTHER DISCHARGE INSTRUCTIONS:  Get Medicines reviewed and adjusted: Please take all your medications with you for your next visit with your Primary MD  Laboratory/radiological data: Please request your Primary MD to go over all hospital tests and procedure/radiological results at the follow up, please ask your Primary MD to get all Hospital records sent to his/her office.  In some cases, they will be blood work, cultures and biopsy results pending at the time of your discharge. Please request that your primary care M.D. goes through all the records of your hospital data and follows up on these results.  Also Note the following: If you experience worsening of your admission symptoms, develop shortness of breath, life threatening emergency, suicidal or homicidal thoughts you must seek medical attention immediately by calling 911 or calling your MD immediately  if symptoms less severe.  You must read complete instructions/literature along with all the possible adverse reactions/side effects for all the Medicines you take and that have been prescribed to you. Take any new Medicines after you have completely understood and accpet all the possible  adverse reactions/side effects.   Do not drive when taking Pain medications or sleeping medications (Benzodaizepines)  Do not take more than prescribed Pain, Sleep and Anxiety Medications. It is not advisable to combine anxiety,sleep and pain medications without talking with your primary care practitioner  Special Instructions: If you have smoked or chewed Tobacco  in the last 2 yrs please stop smoking, stop  any regular Alcohol  and or any Recreational drug use.  Wear Seat belts while driving.  Please note: You were cared for by a hospitalist during your hospital stay. Once you are discharged, your primary care physician will handle any further medical issues. Please note that NO REFILLS for any discharge medications will be authorized once you are discharged, as it is imperative that you return to your primary care physician (or establish a relationship with a primary care physician if you do not have one) for your post hospital discharge needs so that they can reassess your need for medications and monitor your lab values.  Total Time spent coordinating discharge including counseling, education and face to face time equals less than 30 minutes.  Signed: Dmetrius Ambs 05/01/2023 2:00 PM

## 2023-05-01 NOTE — Inpatient Diabetes Management (Signed)
Inpatient Diabetes Program Recommendations  AACE/ADA: New Consensus Statement on Inpatient Glycemic Control (2015)  Target Ranges:  Prepandial:   less than 140 mg/dL      Peak postprandial:   less than 180 mg/dL (1-2 hours)      Critically ill patients:  140 - 180 mg/dL   Lab Results  Component Value Date   GLUCAP 261 (H) 05/01/2023   HGBA1C 11.7 (H) 04/30/2023    Review of Glycemic Control  Latest Reference Range & Units 04/30/23 09:22 04/30/23 10:29 04/30/23 11:35 04/30/23 14:46 04/30/23 18:21 04/30/23 21:21 05/01/23 08:16  Glucose-Capillary 70 - 99 mg/dL 956 (H) 387 (H) 564 (H) 224 (H) 351 (H) 238 (H) 261 (H)  (H): Data is abnormally high  Diabetes history: DM 2 Outpatient Diabetes medications: omnipod insulin pump Setting changed at recent Endocrinology visit to  1 unit for every 7 grams of carbohydrate 1 unit drops glucose 30 points (sensitivity)   Current orders for Inpatient glycemic control:  Semglee 20 units at bedtime, Novolog 0-15 units TID and 0-5 units QHS Endocrinologist: Talmage Coin, sees every 3 months A1c 8.4% in July/2024 Currently Insulin pump off and at home  Inpatient Diabetes Program Recommendations:    Please consider:  Semglee 25 units at bedtime Novolog 3 units TID with meals if she eats at least 50%  Will continue to follow while inpatient.  Thank you, Megan Sellar, MSN, CDCES Diabetes Coordinator Inpatient Diabetes Program [phone] (team pager from 8a-5p)

## 2023-05-02 DIAGNOSIS — N186 End stage renal disease: Secondary | ICD-10-CM | POA: Diagnosis not present

## 2023-05-02 DIAGNOSIS — Z992 Dependence on renal dialysis: Secondary | ICD-10-CM | POA: Diagnosis not present

## 2023-05-02 DIAGNOSIS — L299 Pruritus, unspecified: Secondary | ICD-10-CM | POA: Diagnosis not present

## 2023-05-02 DIAGNOSIS — N2581 Secondary hyperparathyroidism of renal origin: Secondary | ICD-10-CM | POA: Diagnosis not present

## 2023-05-05 DIAGNOSIS — I129 Hypertensive chronic kidney disease with stage 1 through stage 4 chronic kidney disease, or unspecified chronic kidney disease: Secondary | ICD-10-CM | POA: Diagnosis not present

## 2023-05-05 DIAGNOSIS — N186 End stage renal disease: Secondary | ICD-10-CM | POA: Diagnosis not present

## 2023-05-05 DIAGNOSIS — Z992 Dependence on renal dialysis: Secondary | ICD-10-CM | POA: Diagnosis not present

## 2023-05-05 DIAGNOSIS — N2581 Secondary hyperparathyroidism of renal origin: Secondary | ICD-10-CM | POA: Diagnosis not present

## 2023-05-05 NOTE — Progress Notes (Unsigned)
REASON FOR VISIT:   To evaluate for hemodialysis access.  MEDICAL ISSUES:   END-STAGE RENAL DISEASE: This patient presents for evaluation for new access.  She has a clotted right forearm loop graft. She has a defibrillator on the left.  She needs a right arm arteriovenous graft.  We will plan to do this on a nondialysis day in the near future.  HPI:   Previously: [name] is a pleasant 52 y.o. female who was seen by Dr. Tawanna Cooler Early on 08/28/2022 to discuss options for hemodialysis access.  On exam at that time, she had palpable radial pulses.  She has very small veins on exam.  He did not feel that she would be a fistula candidate.  We discussed placing an AV graft.  The plan was to wait until she was nearing dialysis before proceeding with surgery.  She subsequently had a for stage basilic vein transposition placed on 10/01/2022.  She has a defibrillator on the left and apparently had some issues with her defibrillator firing after surgery.  This has been corrected reportedly.  She dialyzes on Monday Wednesdays and Fridays.  She has a functioning right IJ tunneled dialysis catheter.  She has a history of significant gastroparesis.  05/06/23: Patient returns to clinic for follow-up after right forearm arteriovenous graft placement.  This is unfortunately thrombosed.  She needs new access.  She has a pacemaker on the left and a tunneled dialysis catheter on the right.  We reviewed our options.  Past Medical History:  Diagnosis Date   AICD (automatic cardioverter/defibrillator) present    Medtronic   Anemia    Anxiety    Arthritis    Asthma    CHF (congestive heart failure) (HCC)    Chronic diastolic CHF (congestive heart failure) (HCC) 12/14/2019   Depression    Diabetic ulcer of left foot (HCC) 05/12/2013   ESRD on hemodialysis (HCC)    MWF at [facility]   Gastroparesis    Generalized abdominal pain    History of chicken pox    Loss of weight 12/02/2019   Migraines     Mixed hyperlipidemia 09/05/2022   Mood disorder (HCC)    anxiety   Myocardial infarction (HCC)    Prurigo nodularis    with diabetic dermopathy   Type 1 diabetes mellitus (HCC) 05/23/2022   Type 1 diabetes, uncontrolled, with neuropathy    [place]   Ulcers of both lower legs (HCC) 02/20/2014    Family History  Problem Relation Age of Onset   Diabetes Mother        type 2   Hypertension Mother    Thyroid disease Mother    Bipolar disorder Mother    Heart disease Mother    Calcium disorder Mother    Cancer Maternal Grandmother        Breast, stomach   Cancer Paternal Grandmother        stomach, lung (smoker)   Diabetes Paternal Grandmother    Diabetes Paternal Grandfather    Heart failure Sister    Diabetes Sister    Stroke Maternal Aunt    Cancer Maternal Uncle        prostate   CAD Maternal Aunt        stents   Cancer Maternal Aunt 54       ovarian    SOCIAL HISTORY: Social History   Tobacco Use   Smoking status: Never   Smokeless tobacco: Never  Substance Use Topics   Alcohol use:  Not Currently    Allergies  Allergen Reactions   Atorvastatin Other (See Comments)    Dizziness    Dulaglutide Nausea And Vomiting and Other (See Comments)    Caused pancreatitis    Metoclopramide Other (See Comments)    Possible movement disorder or tardive dyskinesia while on reglan Long term   Other Itching    Peas and carrots   Sertraline Nausea And Vomiting   Vancomycin Itching and Swelling   Zofran [Ondansetron] Nausea And Vomiting and Other (See Comments)    Causes nausea vomiting to worsen / doesn't really work for patient.   Amoxicillin Itching and Rash   Lantus [Insulin Glargine] Itching and Rash    Has tolerated insulin glargine many times with no reported issues   Miconazole Nitrate Nausea And Vomiting and Rash    Current Outpatient Medications  Medication Sig Dispense Refill   acetaminophen (TYLENOL) 500 MG tablet Take 1,000 mg by mouth every 8 (eight)  hours as needed for moderate pain or mild pain.     albuterol (PROAIR HFA) 108 (90 Base) MCG/ACT inhaler Inhale 2 puffs into the lungs every 6 (six) hours as needed for wheezing or shortness of breath. 6.7 g 1   amLODipine (NORVASC) 10 MG tablet Take 10 mg by mouth daily.     aspirin EC 81 MG tablet Take 1 tablet (81 mg total) by mouth daily. Swallow whole. 30 tablet 12   FIASP 100 UNIT/ML SOLN 120 Units daily at 6 (six) AM.     hydrALAZINE (APRESOLINE) 50 MG tablet Take 1.5 tablets (75 mg total) by mouth 3 (three) times daily. 405 tablet 3   hydrOXYzine (ATARAX) 50 MG tablet Take 50 mg by mouth 3 (three) times daily as needed for anxiety.     insulin aspart (NOVOLOG) 100 UNIT/ML injection Inject 120 Units into the skin See admin instructions. Patient uses this with the Omnipod insulin pump  its vary according to blood sugar. Loads 120 units every 3 days.     Insulin Disposable Pump (OMNIPOD DASH PODS, GEN 4,) MISC Place 1 Applicatorful onto the skin See admin instructions. Every three days     isosorbide dinitrate (ISORDIL) 20 MG tablet Take 20 mg by mouth.     LORazepam (ATIVAN) 0.5 MG tablet Take 1 tablet (0.5 mg total) by mouth every 8 (eight) hours as needed for anxiety (Refractory nausea and vomiting). 30 tablet 0   Methoxy PEG-Epoetin Beta (MIRCERA IJ) as directed. Kid[facility] clinic     metoCLOPramide (REGLAN) 5 MG tablet Take 5 mg by mouth every 8 (eight) hours as needed.     metoprolol succinate (TOPROL-XL) 50 MG 24 hr tablet Take 1 tablet (50 mg total) by mouth daily. Take with or immediately following a meal. (Patient taking differently: Take 50 mg by mouth daily as needed (high bp). Take with or immediately following a meal.) 90 tablet 3   olmesartan (BENICAR) 20 MG tablet Take 20 mg by mouth daily.     polyethylene glycol (MIRALAX / GLYCOLAX) 17 g packet Take 17 g by mouth daily as needed for moderate constipation.     pravastatin (PRAVACHOL) 40 MG tablet Take 1 tablet (40 mg total) by mouth  every evening. 90 tablet 3   pregabalin (LYRICA) 50 MG capsule Take 50 mg by mouth daily as needed (neuropathy).     prochlorperazine (COMPAZINE) 5 MG tablet Take 1 tablet (5 mg total) by mouth every 6 (six) hours as needed for nausea or vomiting. 15 tablet 0  scopolamine (TRANSDERM-SCOP) 1 MG/3DAYS Place 1 patch (1.5 mg total) onto the skin every 3 (three) days. 24 patch 4   tobramycin (TOBREX) 0.3 % ophthalmic solution SMARTSIG:In Eye(s)     torsemide (DEMADEX) 20 MG tablet Take 20 mg by mouth daily.     Vitamin D, Ergocalciferol, (DRISDOL) 1.25 MG (50000 UNIT) CAPS capsule Take 50,000 Units by mouth once a week.     cloNIDine (CATAPRES - DOSED IN MG/24 HR) 0.1 mg/24hr patch Place 0.1 mg onto the skin once a week. (Patient not taking: Reported on 04/30/2023)     Continuous Glucose Sensor (FREESTYLE LIBRE 3 SENSOR) MISC Change sensor every 14 days as directed (Patient not taking: Reported on 04/30/2023) 6 each 4   ezetimibe (ZETIA) 10 MG tablet Take 1 tablet (10 mg total) by mouth daily. (Patient not taking: Reported on 04/30/2023) 90 tablet 3   nitroGLYCERIN (NITROSTAT) 0.4 MG SL tablet Place 1 tablet (0.4 mg total) under the tongue every 5 (five) minutes as needed for chest pain. 90 tablet 3   pantoprazole (PROTONIX) 40 MG tablet Take 1 tablet (40 mg total) by mouth 2 (two) times daily before a meal. (Patient taking differently: Take 40 mg by mouth 2 (two) times daily as needed (heartburn).) 60 tablet 1   No current facility-administered medications for this visit.   PHYSICAL EXAM:   Vitals:   05/06/23 0855  BP: (!) 176/112  Pulse: (!) 104  Resp: 20  Temp: 98.1 F (36.7 C)  SpO2: 99%  Weight: 167 lb (75.8 kg)  Height: 6' (1.829 m)    No distress Regular rate and rhythm Unlabored breathing Palpable radial pulses Right forearm graft is thrombosed Left chest pacemaker Right chest tunneled dialysis catheter  DATA:   None pertinent  [facility] [phone]

## 2023-05-06 ENCOUNTER — Encounter: Payer: Self-pay | Admitting: Vascular Surgery

## 2023-05-06 ENCOUNTER — Ambulatory Visit: Payer: BC Managed Care – PPO

## 2023-05-06 ENCOUNTER — Telehealth: Payer: Self-pay

## 2023-05-06 ENCOUNTER — Ambulatory Visit (INDEPENDENT_AMBULATORY_CARE_PROVIDER_SITE_OTHER): Payer: BC Managed Care – PPO | Admitting: Vascular Surgery

## 2023-05-06 VITALS — BP 176/112 | HR 104 | Temp 98.1°F | Resp 20 | Ht 72.0 in | Wt 167.0 lb

## 2023-05-06 DIAGNOSIS — I871 Compression of vein: Secondary | ICD-10-CM | POA: Diagnosis not present

## 2023-05-06 DIAGNOSIS — T82898A Other specified complication of vascular prosthetic devices, implants and grafts, initial encounter: Secondary | ICD-10-CM | POA: Diagnosis not present

## 2023-05-06 DIAGNOSIS — Z992 Dependence on renal dialysis: Secondary | ICD-10-CM

## 2023-05-06 DIAGNOSIS — I428 Other cardiomyopathies: Secondary | ICD-10-CM

## 2023-05-06 DIAGNOSIS — I469 Cardiac arrest, cause unspecified: Secondary | ICD-10-CM

## 2023-05-06 DIAGNOSIS — N186 End stage renal disease: Secondary | ICD-10-CM | POA: Diagnosis not present

## 2023-05-06 NOTE — Telephone Encounter (Signed)
Attempted to reach pt to schedule her surgery. Left Vm for her to call us back.

## 2023-05-07 DIAGNOSIS — N2581 Secondary hyperparathyroidism of renal origin: Secondary | ICD-10-CM | POA: Diagnosis not present

## 2023-05-07 DIAGNOSIS — N186 End stage renal disease: Secondary | ICD-10-CM | POA: Diagnosis not present

## 2023-05-07 DIAGNOSIS — Z992 Dependence on renal dialysis: Secondary | ICD-10-CM | POA: Diagnosis not present

## 2023-05-07 LAB — CUP PACEART REMOTE DEVICE CHECK
Battery Remaining Longevity: 126 mo
Battery Voltage: 3.03 V
Brady Statistic RV Percent Paced: 0 %
Date Time Interrogation Session: 20241001022723
HighPow Impedance: 62 Ohm
Implantable Lead Connection Status: 753985
Implantable Lead Implant Date: 20230905
Implantable Lead Location: 753860
Implantable Pulse Generator Implant Date: 20230905
Lead Channel Impedance Value: 266 Ohm
Lead Channel Impedance Value: 380 Ohm
Lead Channel Pacing Threshold Amplitude: 0.875 V
Lead Channel Pacing Threshold Pulse Width: 0.4 ms
Lead Channel Sensing Intrinsic Amplitude: 6 mV
Lead Channel Sensing Intrinsic Amplitude: 6 mV
Lead Channel Setting Pacing Amplitude: 2 V
Lead Channel Setting Pacing Pulse Width: 0.4 ms
Lead Channel Setting Sensing Sensitivity: 0.3 mV
Zone Setting Status: 755011
Zone Setting Status: 755011

## 2023-05-08 DIAGNOSIS — N184 Chronic kidney disease, stage 4 (severe): Secondary | ICD-10-CM | POA: Diagnosis not present

## 2023-05-08 DIAGNOSIS — K3184 Gastroparesis: Secondary | ICD-10-CM | POA: Diagnosis not present

## 2023-05-08 DIAGNOSIS — R1110 Vomiting, unspecified: Secondary | ICD-10-CM | POA: Diagnosis not present

## 2023-05-08 DIAGNOSIS — I10 Essential (primary) hypertension: Secondary | ICD-10-CM | POA: Diagnosis not present

## 2023-05-09 ENCOUNTER — Other Ambulatory Visit: Payer: Self-pay

## 2023-05-09 DIAGNOSIS — N186 End stage renal disease: Secondary | ICD-10-CM

## 2023-05-09 DIAGNOSIS — N2581 Secondary hyperparathyroidism of renal origin: Secondary | ICD-10-CM | POA: Diagnosis not present

## 2023-05-09 DIAGNOSIS — Z992 Dependence on renal dialysis: Secondary | ICD-10-CM | POA: Diagnosis not present

## 2023-05-13 ENCOUNTER — Encounter (HOSPITAL_COMMUNITY): Payer: Self-pay

## 2023-05-13 DIAGNOSIS — N2581 Secondary hyperparathyroidism of renal origin: Secondary | ICD-10-CM | POA: Diagnosis not present

## 2023-05-13 DIAGNOSIS — N186 End stage renal disease: Secondary | ICD-10-CM | POA: Diagnosis not present

## 2023-05-13 DIAGNOSIS — Z992 Dependence on renal dialysis: Secondary | ICD-10-CM | POA: Diagnosis not present

## 2023-05-14 DIAGNOSIS — N2581 Secondary hyperparathyroidism of renal origin: Secondary | ICD-10-CM | POA: Diagnosis not present

## 2023-05-14 DIAGNOSIS — N186 End stage renal disease: Secondary | ICD-10-CM | POA: Diagnosis not present

## 2023-05-14 DIAGNOSIS — Z992 Dependence on renal dialysis: Secondary | ICD-10-CM | POA: Diagnosis not present

## 2023-05-16 DIAGNOSIS — N2581 Secondary hyperparathyroidism of renal origin: Secondary | ICD-10-CM | POA: Diagnosis not present

## 2023-05-16 DIAGNOSIS — N186 End stage renal disease: Secondary | ICD-10-CM | POA: Diagnosis not present

## 2023-05-16 DIAGNOSIS — Z992 Dependence on renal dialysis: Secondary | ICD-10-CM | POA: Diagnosis not present

## 2023-05-19 DIAGNOSIS — N2581 Secondary hyperparathyroidism of renal origin: Secondary | ICD-10-CM | POA: Diagnosis not present

## 2023-05-19 DIAGNOSIS — N186 End stage renal disease: Secondary | ICD-10-CM | POA: Diagnosis not present

## 2023-05-19 DIAGNOSIS — Z992 Dependence on renal dialysis: Secondary | ICD-10-CM | POA: Diagnosis not present

## 2023-05-20 ENCOUNTER — Encounter (HOSPITAL_COMMUNITY): Payer: Self-pay | Admitting: Vascular Surgery

## 2023-05-21 ENCOUNTER — Encounter (HOSPITAL_COMMUNITY): Payer: Self-pay | Admitting: Physician Assistant

## 2023-05-21 ENCOUNTER — Telehealth: Payer: Self-pay

## 2023-05-21 ENCOUNTER — Encounter: Payer: Self-pay | Admitting: Cardiovascular Disease

## 2023-05-21 DIAGNOSIS — N186 End stage renal disease: Secondary | ICD-10-CM | POA: Diagnosis not present

## 2023-05-21 DIAGNOSIS — Z992 Dependence on renal dialysis: Secondary | ICD-10-CM | POA: Diagnosis not present

## 2023-05-21 DIAGNOSIS — N2581 Secondary hyperparathyroidism of renal origin: Secondary | ICD-10-CM | POA: Diagnosis not present

## 2023-05-21 NOTE — Progress Notes (Signed)
PERIOPERATIVE PRESCRIPTION FOR IMPLANTED CARDIAC DEVICE PROGRAMMING  Patient Information: Name:  [name]  DOB:  June 10, 1971  MRN:  [id number]    Planned Procedure:  Right arm arteriovenous graft  Surgeon:  Heath Lark MD  Date of Procedure:  05/22/2023  Cautery will be used.  Position during surgery:  supine   Please send documentation back to:  [facility] (Fax # [phone])  Device Information:  Clinic EP Physician:  Dr. York Pellant   Device Type:  Defibrillator Manufacturer and Phone #:  Medtronic: [id number] Pacemaker Dependent?:  No. Date of Last Device Check:  05/06/2023 Normal Device Function?:  Yes.    Electrophysiologist's Recommendations:  Have magnet available. Provide continuous ECG monitoring when magnet is used or reprogramming is to be performed.  Procedure may interfere with device function.  Magnet should be placed over device during procedure.  Per Device Clinic Standing Orders, Lenor Coffin, RN  1:53 PM 05/21/2023

## 2023-05-21 NOTE — Telephone Encounter (Signed)
Received notification from Antionette Poles, PA-C that patient had a stress test ordered by cardiology in May of this year that she did not complete and she also no showed for her followup in July, so she will need cardiology followup prior to surgery to right arm AVG surgery. Dr. Lenell Antu has already been made aware.   Spoke with patient and informed of the above information. She expressed her frustration and dissatisfaction to being canceled the day before surgery. She reports she has an appointment with cardiology on November 7 and will still most likely not be able to complete a stress test because of an injury to her foot. Advised patient that her cardiologist can order the Lexiscan stress test that does not require walking on the treadmill. Patient again voiced her frustration and hung up.

## 2023-05-21 NOTE — Progress Notes (Signed)
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  Coronary CTA 01/2020 showed coronary calcium score of 0 and no evidence of CAD.  Patient has frequent admissions for intractable nausea and vomiting secondary to diabetic gastroparesis. She was also initiated on hemodialysis during admission in March 2024.  At that time cardiology recommended she continue GDMT with dialysis now assisting in volume management.  She was a no-show for cardiology follow-up on 02/14/2023 for discussion of labile hypertension. She also did not complete stress test that was previously ordered on 12/10/22.    Most recent admission 9/25 to 05/01/2023 for intractable nausea and vomiting secondary to gastroparesis in the setting of uncontrolled hyperglycemia.   Patient currently dialyzing Monday Wednesday Friday through right IJ TDC.  She had a first stage basilic vein transposition on 10/01/2022 which was subsequently clotted.  Right upper extremity Gore-Tex graft was placed on 11/19/2022 and also subsequently thrombosed.   History of type 1 diabetes, A1c 11.7 on 04/30/2023.   Discussed case with Dr. Lenell Antu.  Given patient has not completed previously ordered stress test and did not follow-up with cardiology as scheduled, she will need to be postponed to allow for cardiology follow-up before proceeding.   EKG 04/30/2023: Sinus tachycardia.  Rate 101.  Nonspecific T abnormalities, lateral leads.   Perioperative prescription for implanted cardiac device programming 11/18/2022: Device Information:   Clinic EP Physician:  Dr. York Pellant    Device Type:  Defibrillator Manufacturer and Phone #:  Medtronic:  [phone] Pacemaker Dependent?:  No. Date of Last Device Check:  10/04/2022         Normal Device Function?:  Yes.     Electrophysiologist's Recommendations:   Have magnet available. Provide continuous ECG monitoring when magnet is used or reprogramming is to be performed.  Procedure may interfere with device function.  Magnet should be placed over device during procedure.   TTE 10/05/2022: 1. Left ventricular ejection fraction, by estimation, is 40 to 45%. The  left ventricle has mildly decreased function. The left ventricle  demonstrates global hypokinesis. There is mild concentric left ventricular  hypertrophy. Left ventricular diastolic  parameters are consistent with Grade III diastolic dysfunction  (restrictive). Elevated left atrial pressure.   2. Right ventricular systolic function is mildly reduced. The right  ventricular size is moderately enlarged. There is moderately elevated  pulmonary artery systolic pressure.   3. Left atrial size was moderately dilated.   4. Right atrial size was moderately dilated.   5. The mitral valve is normal in structure. No evidence of mitral valve  regurgitation. No evidence of mitral stenosis.   6. Tricuspid valve regurgitation is moderate to severe.   7. The aortic valve is tricuspid. Aortic valve regurgitation is not  visualized. No aortic stenosis is present.   8. The inferior vena cava is dilated in size with <50% respiratory  variability, suggesting right atrial pressure of 15 mmHg.   Comparison(s): Prior images reviewed side by side. The left ventricular  function is significantly worse. The right ventricular systolic function  is worse. TR severity and PA hypertension are also worse, with evidence of  hypervolemia.    Coronary CT 01/19/2020: IMPRESSION: 1. Coronary calcium score of 0.   2. Normal coronary origin with  right dominance.   3. Normal coronary arteries.   4. Severe biatrial enlargement.   Lexiscan/modified Bruce  Sestamibi stress test 12/20/2019: No previous exam available for comparison. Lexiscan/modified Bruce nuclear stress test performed using 1-day protocol. Stress EKG is non-diagnostic, as this is pharmacological stress test. In addition, stress EKG at 71% [facility] showed sinus tachycardia, nonspecific ST-T flattening in inferolateral leads.  Normal myocardial perfusion. Mild global reduction in myocardial thickening. Stress LVEF 42%. High risk study due to low LVEF. Recommend clinical correlation.      Zannie Cove [facility]/Anesthesiology Phone [phone] 05/21/2023 4:42 PM

## 2023-05-22 ENCOUNTER — Ambulatory Visit (HOSPITAL_COMMUNITY)
Admission: RE | Admit: 2023-05-22 | Payer: BC Managed Care – PPO | Source: Home / Self Care | Admitting: Vascular Surgery

## 2023-05-22 SURGERY — INSERTION OF ARTERIOVENOUS (AV) GORE-TEX GRAFT ARM
Anesthesia: Monitor Anesthesia Care | Laterality: Right

## 2023-05-22 NOTE — Progress Notes (Signed)
Remote ICD transmission.   

## 2023-05-23 DIAGNOSIS — Z992 Dependence on renal dialysis: Secondary | ICD-10-CM | POA: Diagnosis not present

## 2023-05-23 DIAGNOSIS — N186 End stage renal disease: Secondary | ICD-10-CM | POA: Diagnosis not present

## 2023-05-23 DIAGNOSIS — N2581 Secondary hyperparathyroidism of renal origin: Secondary | ICD-10-CM | POA: Diagnosis not present

## 2023-05-26 DIAGNOSIS — Z992 Dependence on renal dialysis: Secondary | ICD-10-CM | POA: Diagnosis not present

## 2023-05-26 DIAGNOSIS — N2581 Secondary hyperparathyroidism of renal origin: Secondary | ICD-10-CM | POA: Diagnosis not present

## 2023-05-26 DIAGNOSIS — T8249XA Other complication of vascular dialysis catheter, initial encounter: Secondary | ICD-10-CM | POA: Diagnosis not present

## 2023-05-26 DIAGNOSIS — N186 End stage renal disease: Secondary | ICD-10-CM | POA: Diagnosis not present

## 2023-05-27 DIAGNOSIS — K3184 Gastroparesis: Secondary | ICD-10-CM | POA: Diagnosis not present

## 2023-05-27 DIAGNOSIS — R1114 Bilious vomiting: Secondary | ICD-10-CM | POA: Diagnosis not present

## 2023-05-27 DIAGNOSIS — R112 Nausea with vomiting, unspecified: Secondary | ICD-10-CM | POA: Diagnosis not present

## 2023-05-28 ENCOUNTER — Telehealth: Payer: Self-pay | Admitting: Internal Medicine

## 2023-05-28 DIAGNOSIS — N186 End stage renal disease: Secondary | ICD-10-CM | POA: Diagnosis not present

## 2023-05-28 DIAGNOSIS — T8249XA Other complication of vascular dialysis catheter, initial encounter: Secondary | ICD-10-CM | POA: Diagnosis not present

## 2023-05-28 DIAGNOSIS — N2581 Secondary hyperparathyroidism of renal origin: Secondary | ICD-10-CM | POA: Diagnosis not present

## 2023-05-28 DIAGNOSIS — Z992 Dependence on renal dialysis: Secondary | ICD-10-CM | POA: Diagnosis not present

## 2023-05-28 NOTE — Telephone Encounter (Signed)
Patient stated she wants to have her lab work done the next time she has dialysis (Mondays, Wednesdays and Fridays) and wants to know what she will need to do to get this.

## 2023-05-28 NOTE — Telephone Encounter (Signed)
Voicemail box full unable to leave a message.  

## 2023-05-29 ENCOUNTER — Telehealth (HOSPITAL_COMMUNITY): Payer: Self-pay | Admitting: *Deleted

## 2023-05-29 DIAGNOSIS — E041 Nontoxic single thyroid nodule: Secondary | ICD-10-CM | POA: Diagnosis not present

## 2023-05-29 DIAGNOSIS — E049 Nontoxic goiter, unspecified: Secondary | ICD-10-CM | POA: Diagnosis not present

## 2023-05-29 DIAGNOSIS — Z794 Long term (current) use of insulin: Secondary | ICD-10-CM | POA: Diagnosis not present

## 2023-05-29 DIAGNOSIS — E1140 Type 2 diabetes mellitus with diabetic neuropathy, unspecified: Secondary | ICD-10-CM | POA: Diagnosis not present

## 2023-05-29 NOTE — Telephone Encounter (Signed)
Patient given detailed instructions per Myocardial Perfusion Study Information Sheet for the test on 06/02/23 at 7:15. Patient notified to arrive 15 minutes early and that it is imperative to arrive on time for appointment to keep from having the test rescheduled.  If you need to cancel or reschedule your appointment, please call the office within 24 hours of your appointment. . Patient verbalized understanding.[name]

## 2023-05-30 DIAGNOSIS — N2581 Secondary hyperparathyroidism of renal origin: Secondary | ICD-10-CM | POA: Diagnosis not present

## 2023-05-30 DIAGNOSIS — N186 End stage renal disease: Secondary | ICD-10-CM | POA: Diagnosis not present

## 2023-05-30 DIAGNOSIS — T8249XA Other complication of vascular dialysis catheter, initial encounter: Secondary | ICD-10-CM | POA: Diagnosis not present

## 2023-05-30 DIAGNOSIS — Z992 Dependence on renal dialysis: Secondary | ICD-10-CM | POA: Diagnosis not present

## 2023-06-02 ENCOUNTER — Ambulatory Visit (HOSPITAL_COMMUNITY): Payer: BC Managed Care – PPO | Attending: Internal Medicine

## 2023-06-02 DIAGNOSIS — L299 Pruritus, unspecified: Secondary | ICD-10-CM | POA: Diagnosis not present

## 2023-06-02 DIAGNOSIS — I160 Hypertensive urgency: Secondary | ICD-10-CM | POA: Diagnosis not present

## 2023-06-02 DIAGNOSIS — R072 Precordial pain: Secondary | ICD-10-CM | POA: Insufficient documentation

## 2023-06-02 LAB — MYOCARDIAL PERFUSION IMAGING
LV dias vol: 122 mL (ref 46–106)
LV sys vol: 89 mL
Nuc Stress EF: 27 %
Peak HR: 142 {beats}/min
Rest HR: 108 {beats}/min
Rest Nuclear Isotope Dose: 8.5 mCi
SDS: 0
SRS: 1
SSS: 1
Stress Nuclear Isotope Dose: 25.5 mCi
TID: 1

## 2023-06-02 MED ORDER — TECHNETIUM TC 99M TETROFOSMIN IV KIT
25.5000 | PACK | Freq: Once | INTRAVENOUS | Status: AC | PRN
  Administered 2023-06-02: 25.5 via INTRAVENOUS

## 2023-06-02 MED ORDER — AMINOPHYLLINE 25 MG/ML IV SOLN
150.0000 mg | Freq: Once | INTRAVENOUS | Status: AC
  Administered 2023-06-02: 150 mg via INTRAVENOUS

## 2023-06-02 MED ORDER — REGADENOSON 0.4 MG/5ML IV SOLN
0.4000 mg | Freq: Once | INTRAVENOUS | Status: AC
  Administered 2023-06-02: 0.4 mg via INTRAVENOUS

## 2023-06-02 MED ORDER — TECHNETIUM TC 99M TETROFOSMIN IV KIT
8.5000 | PACK | Freq: Once | INTRAVENOUS | Status: AC | PRN
  Administered 2023-06-02: 8.5 via INTRAVENOUS

## 2023-06-03 ENCOUNTER — Telehealth: Payer: Self-pay

## 2023-06-03 DIAGNOSIS — I428 Other cardiomyopathies: Secondary | ICD-10-CM

## 2023-06-03 DIAGNOSIS — T8249XA Other complication of vascular dialysis catheter, initial encounter: Secondary | ICD-10-CM | POA: Diagnosis not present

## 2023-06-03 DIAGNOSIS — N2581 Secondary hyperparathyroidism of renal origin: Secondary | ICD-10-CM | POA: Diagnosis not present

## 2023-06-03 DIAGNOSIS — Z992 Dependence on renal dialysis: Secondary | ICD-10-CM | POA: Diagnosis not present

## 2023-06-03 DIAGNOSIS — N186 End stage renal disease: Secondary | ICD-10-CM | POA: Diagnosis not present

## 2023-06-03 DIAGNOSIS — I152 Hypertension secondary to endocrine disorders: Secondary | ICD-10-CM

## 2023-06-03 DIAGNOSIS — I10 Essential (primary) hypertension: Secondary | ICD-10-CM

## 2023-06-03 NOTE — Telephone Encounter (Signed)
The patient has been notified of the result and verbalized understanding.  All questions (if any) were answered. Macie Burows, RN 06/03/2023 6:17 PM   Attempted to schedule Echo next available is not until the end of November.  Will send a message to scheduler to see if can get pt scheduled prior to 06/12/23 OV.    Pt reports Dialysis Graft has malfunctioned and site needs to be changed but can't have surgery until cleared by cardiology.  Also reports had a reaction after NMST.  Broke out in bumps and had tremors.  PCP prescribed prednisone which has helped some.   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

## 2023-06-03 NOTE — Telephone Encounter (Signed)
-----   Message from Nurse Corky Crafts sent at 06/02/2023  5:28 PM EDT -----  ----- Message ----- From: Christell Constant, MD Sent: 06/02/2023   4:42 PM EDT To: Delma Officer, PA; [facility] Triage  Results: Negative stress test Hypertensive response LVEF is reported as markedly worse Plan: Confirm with Echo, then follow up with me  Christell Constant, MD

## 2023-06-04 NOTE — Telephone Encounter (Signed)
Called left a message for [facility].  Message left explains that pt needs a FLP and ALT drawn.  Pt would like to have drawn at dialysis reports she is a hard stick.

## 2023-06-04 NOTE — Telephone Encounter (Signed)
Spoke with pt concerning another issue and addressed labs.  Pt needs FLP and ALT would like to have drawn at dialysis d/t being a hard stick.   Called [facility] on [address]  Spoke with a nurse advised of need for labs but per pt is a hard stick.  Was advised Clinic Manager doesn't come in until 10:30 am.  Was advised to call back and speak to [place].

## 2023-06-04 NOTE — Telephone Encounter (Signed)
Called pt rescheduled OV with MD to 06/17/23 at 3:20 pm.

## 2023-06-04 NOTE — Telephone Encounter (Signed)
Called pt advised of MD response to concern regarding surgery for dialysis site change:  Sounds like her non-cardiac issues are getting a bit worse- I'm sorry to hear that.   She has no ischemia, if she needs access that is time sensitive, we can proceed now.  If this is instead elective, we can touch base after her echo.   Thanks,  MAC   Pt thanked me for the call not further concerns at this time.

## 2023-06-05 DIAGNOSIS — Z992 Dependence on renal dialysis: Secondary | ICD-10-CM | POA: Diagnosis not present

## 2023-06-05 DIAGNOSIS — N186 End stage renal disease: Secondary | ICD-10-CM | POA: Diagnosis not present

## 2023-06-05 DIAGNOSIS — I129 Hypertensive chronic kidney disease with stage 1 through stage 4 chronic kidney disease, or unspecified chronic kidney disease: Secondary | ICD-10-CM | POA: Diagnosis not present

## 2023-06-06 DIAGNOSIS — N2581 Secondary hyperparathyroidism of renal origin: Secondary | ICD-10-CM | POA: Diagnosis not present

## 2023-06-06 DIAGNOSIS — T8249XA Other complication of vascular dialysis catheter, initial encounter: Secondary | ICD-10-CM | POA: Diagnosis not present

## 2023-06-06 DIAGNOSIS — N186 End stage renal disease: Secondary | ICD-10-CM | POA: Diagnosis not present

## 2023-06-06 DIAGNOSIS — Z992 Dependence on renal dialysis: Secondary | ICD-10-CM | POA: Diagnosis not present

## 2023-06-09 DIAGNOSIS — Z992 Dependence on renal dialysis: Secondary | ICD-10-CM | POA: Diagnosis not present

## 2023-06-09 DIAGNOSIS — N186 End stage renal disease: Secondary | ICD-10-CM | POA: Diagnosis not present

## 2023-06-09 DIAGNOSIS — T8249XA Other complication of vascular dialysis catheter, initial encounter: Secondary | ICD-10-CM | POA: Diagnosis not present

## 2023-06-09 DIAGNOSIS — N2581 Secondary hyperparathyroidism of renal origin: Secondary | ICD-10-CM | POA: Diagnosis not present

## 2023-06-10 ENCOUNTER — Encounter (HOSPITAL_BASED_OUTPATIENT_CLINIC_OR_DEPARTMENT_OTHER): Payer: Self-pay

## 2023-06-10 ENCOUNTER — Telehealth (HOSPITAL_COMMUNITY): Payer: Self-pay

## 2023-06-10 NOTE — Telephone Encounter (Signed)
Recieved information from a concerned third party asking about how to change providers with [facility], person did not provide information about who the patient was or how to contact them. I asked the concerned party to have the patient contact me so that I could provide information about how to proceed with a request to transition care to a new provider.  Provided my contact number to the concerned party, patient called me and provided information.  Patient confirmed that they were trying to change their provider at [facility]. She mentioned that they were assigned current MD because previous MD retired, and they have concerns that their needs arent being met. I thanked the patient for being an advocate in their personal healthcare needs.  I informed the patient that my contact, Mollie, at [facility] would reach out to assist in documenting their request.  I also informed the patient that I have limited information due to working for a different department.   I told the patient that if they have any additional concerns they are welcome to reach out to me again.

## 2023-06-11 DIAGNOSIS — T8249XA Other complication of vascular dialysis catheter, initial encounter: Secondary | ICD-10-CM | POA: Diagnosis not present

## 2023-06-11 DIAGNOSIS — N2581 Secondary hyperparathyroidism of renal origin: Secondary | ICD-10-CM | POA: Diagnosis not present

## 2023-06-11 DIAGNOSIS — Z992 Dependence on renal dialysis: Secondary | ICD-10-CM | POA: Diagnosis not present

## 2023-06-11 DIAGNOSIS — N186 End stage renal disease: Secondary | ICD-10-CM | POA: Diagnosis not present

## 2023-06-12 ENCOUNTER — Ambulatory Visit: Payer: BC Managed Care – PPO | Admitting: Internal Medicine

## 2023-06-12 DIAGNOSIS — N184 Chronic kidney disease, stage 4 (severe): Secondary | ICD-10-CM | POA: Diagnosis not present

## 2023-06-12 DIAGNOSIS — E1140 Type 2 diabetes mellitus with diabetic neuropathy, unspecified: Secondary | ICD-10-CM | POA: Diagnosis not present

## 2023-06-12 DIAGNOSIS — I10 Essential (primary) hypertension: Secondary | ICD-10-CM | POA: Diagnosis not present

## 2023-06-13 DIAGNOSIS — Z992 Dependence on renal dialysis: Secondary | ICD-10-CM | POA: Diagnosis not present

## 2023-06-13 DIAGNOSIS — N2581 Secondary hyperparathyroidism of renal origin: Secondary | ICD-10-CM | POA: Diagnosis not present

## 2023-06-13 DIAGNOSIS — N186 End stage renal disease: Secondary | ICD-10-CM | POA: Diagnosis not present

## 2023-06-13 DIAGNOSIS — T8249XA Other complication of vascular dialysis catheter, initial encounter: Secondary | ICD-10-CM | POA: Diagnosis not present

## 2023-06-16 ENCOUNTER — Ambulatory Visit (HOSPITAL_COMMUNITY): Payer: BC Managed Care – PPO | Attending: Cardiovascular Disease

## 2023-06-16 DIAGNOSIS — E1159 Type 2 diabetes mellitus with other circulatory complications: Secondary | ICD-10-CM | POA: Diagnosis not present

## 2023-06-16 DIAGNOSIS — N2581 Secondary hyperparathyroidism of renal origin: Secondary | ICD-10-CM | POA: Diagnosis not present

## 2023-06-16 DIAGNOSIS — Z992 Dependence on renal dialysis: Secondary | ICD-10-CM | POA: Diagnosis not present

## 2023-06-16 DIAGNOSIS — T8249XA Other complication of vascular dialysis catheter, initial encounter: Secondary | ICD-10-CM | POA: Diagnosis not present

## 2023-06-16 DIAGNOSIS — I428 Other cardiomyopathies: Secondary | ICD-10-CM | POA: Diagnosis not present

## 2023-06-16 DIAGNOSIS — I152 Hypertension secondary to endocrine disorders: Secondary | ICD-10-CM | POA: Diagnosis not present

## 2023-06-16 DIAGNOSIS — I10 Essential (primary) hypertension: Secondary | ICD-10-CM

## 2023-06-16 DIAGNOSIS — N186 End stage renal disease: Secondary | ICD-10-CM | POA: Diagnosis not present

## 2023-06-16 LAB — ECHOCARDIOGRAM COMPLETE
AR max vel: 2.16 cm2
AV Area VTI: 2.13 cm2
AV Area mean vel: 2.15 cm2
AV Mean grad: 3 mm[Hg]
AV Peak grad: 6 mm[Hg]
Ao pk vel: 1.22 m/s
Area-P 1/2: 4.89 cm2
S' Lateral: 3.85 cm

## 2023-06-17 ENCOUNTER — Telehealth: Payer: Self-pay | Admitting: Internal Medicine

## 2023-06-17 ENCOUNTER — Encounter: Payer: Self-pay | Admitting: Internal Medicine

## 2023-06-17 ENCOUNTER — Ambulatory Visit: Payer: BC Managed Care – PPO | Attending: Internal Medicine | Admitting: Internal Medicine

## 2023-06-17 VITALS — BP 122/58 | HR 92 | Ht 72.0 in | Wt 167.0 lb

## 2023-06-17 DIAGNOSIS — N186 End stage renal disease: Secondary | ICD-10-CM

## 2023-06-17 DIAGNOSIS — I428 Other cardiomyopathies: Secondary | ICD-10-CM

## 2023-06-17 DIAGNOSIS — E7801 Familial hypercholesterolemia: Secondary | ICD-10-CM

## 2023-06-17 DIAGNOSIS — I10 Essential (primary) hypertension: Secondary | ICD-10-CM | POA: Diagnosis not present

## 2023-06-17 DIAGNOSIS — I2510 Atherosclerotic heart disease of native coronary artery without angina pectoris: Secondary | ICD-10-CM

## 2023-06-17 DIAGNOSIS — E782 Mixed hyperlipidemia: Secondary | ICD-10-CM

## 2023-06-17 MED ORDER — CARVEDILOL 3.125 MG PO TABS: 3.1250 mg | ORAL_TABLET | ORAL | 3 refills | Status: DC

## 2023-06-17 MED ORDER — CARVEDILOL 3.125 MG PO TABS: 3.1250 mg | ORAL_TABLET | Freq: Two times a day (BID) | ORAL | 3 refills | Status: DC

## 2023-06-17 NOTE — Progress Notes (Signed)
Cardiology Office Note:    Date:  06/17/2023   ID:  [name], DOB May 08, 1971, MRN [id number]  PCP:  Delma Officer, PA   [facility] Providers Cardiologist:  Christell Constant, MD Electrophysiologist:  Maurice Small, MD     Referring MD: Delma Officer, [place]   CC:  Heart Failure evaluation  History of Present Illness:    [name] is a 52 y.o. 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  Has CKD and is pending renal transplant discussions. 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   2024: Planning to start HD.  Blood pressure have improved.  Despite this she has had worsening heart failure.  Recently blood pressure has worsened as well.   [name], a 52 year old patient with a complex medical history, presents with a primary concern of worsening heart function. 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 Her medical history is further complicated by type 1 diabetes with gastroparesis, which worsened in 2023.  In 2024, she planned to start hemodialysis and made progress with improved blood pressures. However, towards the end of the year, her blood pressure started worsening again, and her heart function was deteriorating, with an ejection fraction dropping to about 35%.  She has been experiencing issues with dumping syndrome, which has led to frequent vomiting, particularly during dialysis sessions. She has been evaluated for transplant but was deemed ineligible due to the dumping syndrome.  She has familial hypercholesterolemia, with cholesterol levels remaining high despite being on some medications. She has also had issues with different cholesterol medications. She still produces urine and  takes torsemide as needed, although she has not needed it for a long time. She has not needed to use her nitroglycerin for chest pain.  She has an ICD for sudden cardiac death and has been followed by an electrophysiology group. Despite optimally titrated goal-directed medical therapy, her ejection fraction continues to decrease, currently at 35%.   Past Medical History:  Diagnosis Date   AICD (automatic cardioverter/defibrillator) present    Medtronic   Anemia    Anxiety    Arthritis    Asthma    CHF (congestive heart failure) (HCC)    Chronic diastolic CHF (congestive heart failure) (HCC) 12/14/2019   Depression    Diabetic ulcer of left foot (HCC) 05/12/2013   ESRD on hemodialysis (HCC)    MWF at [facility]   Gastroparesis    Generalized abdominal pain    History of chicken pox    Loss of weight 12/02/2019   Migraines    Mixed hyperlipidemia 09/05/2022   Mood disorder (HCC)    anxiety   Myocardial infarction (HCC)    Prurigo nodularis    with diabetic dermopathy   Type 1 diabetes mellitus (HCC) 05/23/2022   Type 1 diabetes, uncontrolled, with neuropathy    [place]   Ulcers of both lower legs (HCC) 02/20/2014    Past Surgical History:  Procedure Laterality Date   AV FISTULA PLACEMENT Left 10/01/2022   Procedure: ARTERIOVENOUS (AV)FISTULA CREATION;  Surgeon: Larina Earthly, MD;  Location: [facility];  Service: Vascular;  Laterality: Left;   AV FISTULA PLACEMENT Right 11/19/2022   Procedure: INSERTION OF RIGHT ARM ARTERIOVENOUS (AV) GORE-TEX GRAFT;  Surgeon: Chuck Hint, MD;  Location: [facility];  Service: Vascular;  Laterality: Right;   BIOPSY  08/11/2020   Procedure: BIOPSY;  Surgeon: Meryl Dare, MD;  Location: [facility];  Service: Endoscopy;;   BIOPSY  04/02/2022   Procedure: BIOPSY;  Surgeon: Lynann Bologna, MD;  Location: [facility];  Service: Gastroenterology;;   ESOPHAGOGASTRODUODENOSCOPY (EGD) WITH PROPOFOL N/A 08/11/2020   Procedure:  ESOPHAGOGASTRODUODENOSCOPY (EGD) WITH PROPOFOL;  Surgeon: Meryl Dare, MD;  Location: [facility];  Service: Endoscopy;  Laterality: N/A;   ESOPHAGOGASTRODUODENOSCOPY (EGD) WITH PROPOFOL N/A 04/02/2022   Procedure: ESOPHAGOGASTRODUODENOSCOPY (EGD) WITH PROPOFOL;  Surgeon: Lynann Bologna, MD;  Location: [facility];  Service: Gastroenterology;  Laterality: N/A;   ESOPHAGOGASTRODUODENOSCOPY (EGD) WITH PROPOFOL N/A 05/23/2022   Procedure: ESOPHAGOGASTRODUODENOSCOPY (EGD) WITH PROPOFOL;  Surgeon: Midge Minium, MD;  Location: [facility] ENDOSCOPY;  Service: Endoscopy;  Laterality: N/A;   ICD IMPLANT N/A 04/09/2022   Procedure: ICD IMPLANT;  Surgeon: [name], MD;  Location: [facility] INVASIVE CV LAB;  Service: Cardiovascular;  Laterality: N/A;   IR FLUORO GUIDE CV LINE RIGHT  10/07/2022   IR US GUIDE VASC ACCESS RIGHT  10/07/2022   treadmill stress test  01/2013   WNL, low risk study    Current Medications: Current Meds  Medication Sig   acetaminophen (TYLENOL) 500 MG tablet Take 1,000 mg by mouth every 8 (eight) hours as needed for moderate pain or mild pain.   albuterol (PROAIR HFA) 108 (90 Base) MCG/ACT inhaler Inhale 2 puffs into the lungs every 6 (six) hours as needed for wheezing or shortness of breath.   aspirin EC 81 MG tablet Take 1 tablet (81 mg total) by mouth daily. Swallow whole.   carvedilol (COREG) 3.125 MG tablet Take 1 tablet (3.125 mg total) by mouth 2 (two) times daily.   cloNIDine (CATAPRES) 0.1 MG tablet Take 0.1 mg by mouth daily as needed (bp > 180/120).   ezetimibe (ZETIA) 10 MG tablet Take 1 tablet (10 mg total) by mouth daily.   famotidine (PEPCID) 40 MG tablet Take 40 mg by mouth daily.   hydrALAZINE (APRESOLINE) 50 MG tablet Take 1.5 tablets (75 mg total) by mouth 3 (three) times daily.   hydrOXYzine (ATARAX) 50 MG tablet Take 50 mg by mouth 3 (three) times daily as needed for anxiety.   insulin aspart (NOVOLOG) 100 UNIT/ML injection Inject 120 Units into the skin See  admin instructions. Patient uses this with the Omnipod insulin pump  its vary according to blood sugar. Loads 120 units every 3 days. (Patient taking differently: Inject 150 Units into the skin See admin instructions. Patient uses this with the Omnipod insulin pump  its vary according to blood sugar. Loads 150 units every 3 days.)   Insulin Disposable Pump (OMNIPOD DASH PODS, GEN 4,) MISC Place 1 Applicatorful onto the skin See admin instructions. Every three days   isosorbide dinitrate (ISORDIL) 20 MG tablet Take 20 mg by mouth 3 (three) times daily.   LORazepam (ATIVAN) 0.5 MG tablet Take 1 tablet (0.5 mg total) by mouth every 8 (eight) hours as needed for anxiety (Refractory nausea and vomiting).   Methoxy PEG-Epoetin Beta (MIRCERA IJ) as directed. [facility]   metoCLOPramide (REGLAN) 5 MG tablet Take 5-10 mg by mouth every 8 (eight) hours as needed for vomiting or nausea.   nitroGLYCERIN (NITROSTAT) 0.4 MG SL tablet Place 1 tablet (0.4 mg total) under the tongue every 5 (five) minutes as needed for chest pain.   olmesartan (BENICAR) 20 MG tablet Take 20 mg by  mouth daily.   polyethylene glycol (MIRALAX / GLYCOLAX) 17 g packet Take 17 g by mouth daily as needed for moderate constipation.   pravastatin (PRAVACHOL) 40 MG tablet Take 1 tablet (40 mg total) by mouth every evening.   pregabalin (LYRICA) 50 MG capsule Take 50 mg by mouth daily as needed (neuropathy).   prochlorperazine (COMPAZINE) 5 MG tablet Take 1 tablet (5 mg total) by mouth every 6 (six) hours as needed for nausea or vomiting.   RABEprazole (ACIPHEX) 20 MG tablet Take 20 mg by mouth 2 (two) times daily.   scopolamine (TRANSDERM-SCOP) 1 MG/3DAYS Place 1 patch (1.5 mg total) onto the skin every 3 (three) days.   tobramycin (TOBREX) 0.3 % ophthalmic solution Place 1 drop into both eyes See admin instructions. Instill 1 drop into both eyes once every 8 weeks before eye injections   torsemide (DEMADEX) 20 MG tablet Take 20 mg by mouth  daily as needed (swelling).   TRESIBA FLEXTOUCH 100 UNIT/ML FlexTouch Pen Inject into the skin as needed (when insulin pump is not working).   Vitamin D, Ergocalciferol, (DRISDOL) 1.25 MG (50000 UNIT) CAPS capsule Take 50,000 Units by mouth once a week.   VOQUEZNA 10 MG TABS Take 1 tablet by mouth daily.   [DISCONTINUED] metoprolol succinate (TOPROL-XL) 50 MG 24 hr tablet Take 1 tablet (50 mg total) by mouth daily. Take with or immediately following a meal.     Allergies:   Atorvastatin, Dulaglutide, Ivp dye [iodinated contrast media], Metoclopramide, Other, Sertraline, Vancomycin, Zofran [ondansetron], Amoxicillin, Lantus [insulin glargine], and Miconazole nitrate   Social History   Socioeconomic History   Marital status: Divorced    Spouse name: Not on file   Number of children: 2   Years of education: Not on file   Highest education level: Some college, no degree  Occupational History   Occupation: Production designer, theatre/television/film  Tobacco Use   Smoking status: Never   Smokeless tobacco: Never  Vaping Use   Vaping status: Never Used  Substance and Sexual Activity   Alcohol use: Not Currently   Drug use: No   Sexual activity: Yes    Partners: Male    Birth control/protection: None  Other Topics Concern   Not on file  Social History Narrative   Caffeine use: daily   Occupation: cleans houses   Regular exercise: yes, active 5x/wk   Diet: good water, fruits/vegetables daily   Social Determinants of Health   Financial Resource Strain: Medium Risk (12/26/2022)   Overall Financial Resource Strain (CARDIA)    Difficulty of Paying Living Expenses: Somewhat hard  Food Insecurity: No Food Insecurity (12/26/2022)   Hunger Vital Sign    Worried About Running Out of Food in the Last Year: Never true    Ran Out of Food in the Last Year: Never true  Transportation Needs: No Transportation Needs (12/26/2022)   PRAPARE - Administrator, Civil Service (Medical): No    Lack of Transportation  (Non-Medical): No  Physical Activity: Inactive (12/26/2022)   Exercise Vital Sign    Days of Exercise per Week: 0 days    Minutes of Exercise per Session: 0 min  Stress: Stress Concern Present (01/07/2023)   [employer] of Occupational Health - Occupational Stress Questionnaire    Feeling of Stress : To some extent  Social Connections: Socially Isolated (12/26/2022)   Social Connection and Isolation Panel [NHANES]    Frequency of Communication with Friends and Family: Twice a week    Frequency of  Social Gatherings with Friends and Family: More than three times a week    Attends Religious Services: Never    Database administrator or Organizations: No    Attends Engineer, structural: Never    Marital Status: Divorced     Family History: The patient's family history includes Bipolar disorder in her mother; CAD in her maternal aunt; Calcium disorder in her mother; Cancer in her maternal grandmother, maternal uncle, and paternal grandmother; Cancer (age of onset: 56) in her maternal aunt; Diabetes in her mother, paternal grandfather, paternal grandmother, and sister; Heart disease in her mother; Heart failure in her sister; Hypertension in her mother; Stroke in her maternal aunt; Thyroid disease in her mother.  ROS:   Please see the history of present illness.      EKGs/Labs/Other Studies Reviewed:    The following studies were reviewed today:  EKG:   09/01/2022: SR with diffuse TWI   Cardiac Studies & Procedures     STRESS TESTS  MYOCARDIAL PERFUSION IMAGING 06/02/2023  Narrative   Findings are consistent with no ischemia. The study is high risk.   LV perfusion is abnormal. Defect 1: There is a small defect with mild reduction in uptake present in the apical apex location(s) that is fixed. There is normal wall motion in the defect area. Consistent with artifact.   Left ventricular function is abnormal. Global function is severely reduced. Nuclear stress EF: 27%. The  left ventricular ejection fraction is severely decreased (<30%). End diastolic cavity size is mildly enlarged. End systolic cavity size is mildly enlarged.   Prior study available for comparison from 01/20/2013. There are changes compared to prior study. The left ventricular ejection fraction has decreased. Normal perfusion, LVEF 42%  Small size, mild intensity fixed apical perfusion defect, likely thinning artifact. No significant reversible ischemia. LVEF 27% with global hypokinesis. This is a high risk study (due to reduced LV function). Compared to a prior study in 2014, the LVEF is significantly lower.   ECHOCARDIOGRAM  ECHOCARDIOGRAM COMPLETE 06/16/2023  Narrative ECHOCARDIOGRAM REPORT    Patient Name:   [name] Date of Exam: 06/16/2023 Medical Rec #:  [id number]      Height:       72.0 in Accession #:    [id number]     Weight:       167.0 lb Date of Birth:  Jan 15, 1971     BSA:          1.973 m Patient Age:    51 years       BP:           130/74 mmHg Patient Gender: F              HR:           95 bpm. Exam Location:  [address]  Procedure: 2D Echo, 3D Echo, Cardiac Doppler and Color Doppler  Indications:    (nonischemic cardiomyopathy  History:        Patient has prior history of Echocardiogram examinations, most recent 10/05/2022. CHF, Previous Myocardial Infarction and CAD; Risk Factors:Hypertension and Diabetes.  Sonographer:    Dondra Prader RVT RCS Referring Phys: [id number] [facility] A Reshunda Strider   Sonographer Comments: Global longitudinal strain was attempted. IMPRESSIONS   1. Left ventricular ejection fraction, by estimation, is 35 to 40%. The left ventricle has moderately decreased function. The left ventricle demonstrates global hypokinesis. Left ventricular diastolic parameters are consistent with Grade II diastolic dysfunction (pseudonormalization). Elevated left ventricular end-diastolic pressure.  The average left ventricular global longitudinal strain is  -8.9 %. The global longitudinal strain is abnormal. 2. Right ventricular systolic function is normal. The right ventricular size is normal. There is normal pulmonary artery systolic pressure. 3. Left atrial size was severely dilated. 4. The mitral valve is degenerative. Trivial mitral valve regurgitation. No evidence of mitral stenosis. 5. The aortic valve is tricuspid. Aortic valve regurgitation is not visualized. No aortic stenosis is present. 6. The inferior vena cava is normal in size with greater than 50% respiratory variability, suggesting right atrial pressure of 3 mmHg.  Comparison(s): 10/05/22-EF 40-45%.  FINDINGS Left Ventricle: Left ventricular ejection fraction, by estimation, is 35 to 40%. The left ventricle has moderately decreased function. The left ventricle demonstrates global hypokinesis. The average left ventricular global longitudinal strain is -8.9 %. The global longitudinal strain is abnormal. The left ventricular internal cavity size was normal in size. There is no left ventricular hypertrophy. Left ventricular diastolic parameters are consistent with Grade II diastolic dysfunction (pseudonormalization). Elevated left ventricular end-diastolic pressure.  Right Ventricle: The right ventricular size is normal. No increase in right ventricular wall thickness. Right ventricular systolic function is normal. There is normal pulmonary artery systolic pressure. The tricuspid regurgitant velocity is 1.95 m/s, and with an assumed right atrial pressure of 3 mmHg, the estimated right ventricular systolic pressure is 18.2 mmHg.  Left Atrium: Left atrial size was severely dilated.  Right Atrium: Right atrial size was normal in size.  Pericardium: There is no evidence of pericardial effusion.  Mitral Valve: The mitral valve is degenerative in appearance. There is moderate thickening of the mitral valve leaflet(s). There is mild calcification of the mitral valve leaflet(s). Trivial mitral  valve regurgitation. No evidence of mitral valve stenosis.  Tricuspid Valve: The tricuspid valve is normal in structure. Tricuspid valve regurgitation is trivial. No evidence of tricuspid stenosis.  Aortic Valve: The aortic valve is tricuspid. Aortic valve regurgitation is not visualized. No aortic stenosis is present. Aortic valve mean gradient measures 3.0 mmHg. Aortic valve peak gradient measures 6.0 mmHg. Aortic valve area, by VTI measures 2.13 cm.  Pulmonic Valve: The pulmonic valve was normal in structure. Pulmonic valve regurgitation is not visualized. No evidence of pulmonic stenosis.  Aorta: The aortic root is normal in size and structure.  Venous: The inferior vena cava is normal in size with greater than 50% respiratory variability, suggesting right atrial pressure of 3 mmHg.  IAS/Shunts: No atrial level shunt detected by color flow Doppler.  Additional Comments: A device lead is visualized.   LEFT VENTRICLE PLAX 2D LVIDd:         5.10 cm   Diastology LVIDs:         3.85 cm   LV e' medial:    5.40 cm/s LV PW:         1.05 cm   LV E/e' medial:  17.3 LV IVS:        0.90 cm   LV e' lateral:   6.98 cm/s LVOT diam:     2.00 cm   LV E/e' lateral: 13.4 LV SV:         51 LV SV Index:   26        2D Longitudinal Strain LVOT Area:     3.14 cm  2D Strain GLS (A2C):   -7.5 % 2D Strain GLS (A3C):   -10.0 % 2D Strain GLS (A4C):   -9.3 % 2D Strain GLS Avg:     -8.9 %  3D Volume EF: 3D EF:        37 % LV EDV:       176 ml LV ESV:       110 ml LV SV:        66 ml  RIGHT VENTRICLE            IVC RV Basal diam:  3.00 cm    IVC diam: 1.60 cm RV S prime:     8.03 cm/s TAPSE (M-mode): 1.8 cm  LEFT ATRIUM             Index        RIGHT ATRIUM           Index LA Vol (A2C):   97.6 ml 49.46 ml/m  RA Area:     12.70 cm LA Vol (A4C):   82.5 ml 41.81 ml/m  RA Volume:   30.50 ml  15.46 ml/m LA Biplane Vol: 91.6 ml 46.42 ml/m AORTIC VALVE                    PULMONIC VALVE AV Area  (Vmax):    2.16 cm     PV Vmax:       0.86 m/s AV Area (Vmean):   2.15 cm     PV Peak grad:  2.9 mmHg AV Area (VTI):     2.13 cm AV Vmax:           122.00 cm/s AV Vmean:          85.000 cm/s AV VTI:            0.240 m AV Peak Grad:      6.0 mmHg AV Mean Grad:      3.0 mmHg LVOT Vmax:         83.73 cm/s LVOT Vmean:        58.300 cm/s LVOT VTI:          0.163 m LVOT/AV VTI ratio: 0.68  AORTA Ao Root diam: 2.90 cm Ao Asc diam:  3.10 cm  MITRAL VALVE               TRICUSPID VALVE MV Area (PHT): 4.89 cm    TR Peak grad:   15.2 mmHg MV Decel Time: 155 msec    TR Vmax:        195.00 cm/s MV E velocity: 93.30 cm/s MV A velocity: 58.20 cm/s  SHUNTS MV E/A ratio:  1.60        Systemic VTI:  0.16 m Systemic Diam: 2.00 cm  Chilton Si MD Electronically signed by Chilton Si MD Signature Date/Time: 06/16/2023/1:58:05 PM    Final     CT SCANS  CT CORONARY MORPH W/CTA COR W/SCORE 01/19/2020  Addendum 01/19/2020  3:10 PM ADDENDUM REPORT: 01/19/2020 15:08  EXAM: OVER-READ INTERPRETATION  CT CHEST  The following report is an over-read performed by radiologist Dr. Deberah Pelton [facility] on 01/19/2020. This over-read does not include interpretation of cardiac or coronary anatomy or pathology. The coronary calcium score/coronary CTA interpretation by the cardiologist is attached.  COMPARISON:  None.  FINDINGS: The visualized portions of the lower lung fields show no suspicious nodules, masses, or infiltrates. Small bilateral pleural effusions are seen.  The visualized portions of the mediastinum and chest wall are unremarkable.  IMPRESSION: Small bilateral pleural effusions. No other significant non-cardiovascular abnormality seen in visualized portion of the thorax.   Electronically Signed By: Danae Orleans M.D. On: 01/19/2020 15:08  Narrative CLINICAL DATA:  Shortness of breath/Normal stress test/Rule out balanced  ischemia  EXAM: Cardiac/Coronary CTA  TECHNIQUE: The patient was scanned on a [employer]. A 100 kV retrospective scan was triggered in the descending thoracic aorta at 111 HU's. Axial non-contrast 3 mm slices were carried out through the heart. The data set was analyzed on a dedicated work station and scored using the Agatson method. Gantry rotation speed was 250 msecs and collimation was .6 mm. 5 mg IV diltiazem and 0.8 mg of sl NTG was given. The 3D data set was reconstructed in 5% intervals of the 35-75 % of the R-R cycle. Diastolic phases were analyzed on a dedicated work station using MPR, MIP and VRT modes. The patient received 80 cc of contrast.  FINDINGS: Image quality: excellent.  Noise artifact is: Limited.  Coronary Arteries:  Normal coronary origin.  Right dominance.  Left main: The left main is a large caliber vessel with a normal take off from the left coronary cusp that bifurcates to form a left anterior descending artery and a left circumflex artery. There is no plaque or stenosis.  Left anterior descending artery: The LAD is patent without evidence of plaque or stenosis. The LAD gives off 1 large patent diagonal branch.  Left circumflex artery: The LCX is non-dominant and patent with no evidence of plaque or stenosis. The LCX gives off 1 patent obtuse marginal branch.  Right coronary artery: The RCA is dominant with normal take off from the right coronary cusp. There is no evidence of plaque or stenosis. The RCA terminates as a PDA and right posterolateral branch without evidence of plaque or stenosis.  Right Atrium: Right atrial size is severely dilated.  Right Ventricle: The right ventricular cavity is within normal limits.  Left Atrium: Left atrial size is severely dilated with no left atrial appendage filling defect.  Left Ventricle: The ventricular cavity size is within normal limits. There are no stigmata of prior infarction. There  is no abnormal filling defect.  Pulmonary arteries: Normal size without proximal filling defect.  Pulmonary veins: Normal pulmonary venous drainage.  Pericardium: Normal thickness with no significant effusion or calcium present.  Cardiac valves: The aortic valve is trileaflet without significant calcification. The mitral valve is normal structure without significant calcification.  Aorta: Normal caliber with no significant disease.  Extra-cardiac findings: See attached radiology report for non-cardiac structures.  IMPRESSION: 1. Coronary calcium score of 0.  2. Normal coronary origin with right dominance.  3. Normal coronary arteries.  4. Severe biatrial enlargement.  Lennie Odor, MD  Electronically Signed: By: Lennie Odor On: 01/19/2020 12:56            Recent Labs: 10/09/2022: B Natriuretic Peptide 1,719.6 10/21/2022: TSH 1.587 04/30/2023: ALT 12; Magnesium 1.9 05/01/2023: BUN 20; Creatinine, Ser 4.58; Hemoglobin 10.1; Platelets 134; Potassium 3.4; Sodium 134  Recent Lipid Panel    Component Value Date/Time   CHOL 262 (H) 12/05/2022 0848   TRIG 94 12/05/2022 0848   HDL 63 12/05/2022 0848   CHOLHDL 4.2 12/05/2022 0848   CHOLHDL 5.2 10/12/2019 0834   VLDL 16 10/12/2019 0834   LDLCALC 183 (H) 12/05/2022 0848   LDLDIRECT 104.1 04/12/2014 1608     Physical Exam:    VS:  BP (!) 122/58   Pulse 92   Ht 6' (1.829 m)   Wt 167 lb (75.8 kg)   LMP 11/17/2021 (Approximate)   SpO2 97%   BMI 22.65 kg/m     Wt  Readings from Last 3 Encounters:  06/17/23 167 lb (75.8 kg)  06/02/23 167 lb (75.8 kg)  05/06/23 167 lb (75.8 kg)    GEN: NAD HEENT: Normal CARDIAC: RRR, no murmurs, rubs, gallops RESPIRATORY:  Clear to auscultation without rales, wheezing or rhonchi  ABDOMEN: Soft, non-tender, non-distended MUSCULOSKELETAL:  No edema; No deformity  SKIN: Warm and dry NEUROLOGIC:  Alert and oriented x 3 PSYCHIATRIC:  normal affect   ASSESSMENT:    1. NICM  (nonischemic cardiomyopathy) (HCC)   2. Coronary artery disease involving native coronary artery of native heart without angina pectoris   3. Essential hypertension   4. ESRD (end stage renal disease) (HCC)   5. Familial hypercholesterolemia   6. Mixed hyperlipidemia     PLAN:    Heart Failure with Reduced Ejection Fraction (HFrEF)   - Continual decrease in ejection fraction despite optimally titrated goal-directed medical therapy. Current ejection fraction is 35%. History of sudden cardiac death and ICD placement in 21-Jul-2022 for an aborted arrest. Symptoms include reflux tachycardia but no atrial fibrillation. 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 Explained limited treatment options due to dialysis and potential use of barostem to stimulate the nervous system and strengthen heart function.   - starting coreg 3.125 mg PO BID (stop metoprolol); titrate this as much as tolerated on non HD days, if further BP elevation max hydralazine, continue isordil - Order cardiopulmonary exercise test  when it becomes available again - Refer to advanced heart failure team based on test results   - will reach out about Barostim - at next visit if maxed on the above, will trial Vericiguat  Chronic Kidney Disease (CKD) progressing to End-Stage Renal Disease (ESRD)   - CKD with progression to ESRD. Started shemodialysis on Mondays, Wednesdays, and Fridays. Renal transplant discussions pending but complicated by dumping syndrome, disqualifying her from the transplant list currently. Transplant eligibility may be reconsidered if dumping syndrome is managed.   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 Symptoms include vomiting during dialysis sessions, leading to hypotension. Managed with a pill once a day and dietary modifications. Discussed the need for small meals six times a day and the challenges of managing multiple dietary restrictions.   - Continue current medication for dumping syndrome ; we discussed her QT prolongation in the setting of her ICD; QT prolonging medication should be minimized to avoid TdP we have reviewed this at length, she has a primary prevention ICD - Follow dietary recommendations for small meals six times a day   - Monitor blood pressure during dialysis sessions   - Follow up with Dr. Earlene Plater for gastroparesis and dumping syndrome management    Hypertension   - Monitor blood pressure regularly   - up titration as above  Hypercholesterolemia   - Familial hypercholesterolemia with elevated cholesterol levels despite current medications. Issues with atorvastatin and rosuvastatin. Plan to recheck cholesterol levels in the spring and evaluate for newer medications if cholesterol remains high. Discussed potential use of inclisiran, a biannual infusion, if insurance covers it.   - Recheck cholesterol levels in the spring   - Evaluate for newer medications if cholesterol remains high   - Consider inclisiran  if insurance covers it  of FH  Follow-up   in the spring  Time Spent Directly with Patient:   I have spent a total of 47 minutes with the patient reviewing notes, imaging, EKGs, labs and examining the patient as well as establishing an assessment and plan that was discussed personally with the patient. Discussed disease state education, discussed future transplant options.  Reviewed medication interaction.     Medication Adjustments/Labs and Tests Ordered: Current medicines are reviewed at length with the patient today.  Concerns regarding medicines are outlined above.  Orders Placed This Encounter  Procedures   AMB Referral to [facility]   EKG  12-Lead   Meds ordered this encounter  Medications   carvedilol (COREG) 3.125 MG tablet    Sig: Take 1 tablet (3.125 mg total) by mouth 2 (two) times daily.    Dispense:  180 tablet    Refill:  3    Patient Instructions  Medication Instructions:  Your physician has recommended you make the following change in your medication:  STOP: metoprolol START: carvedilol (Coreg) 3.125 mg by mouth twice daily  *If you need a refill on your cardiac medications before your next appointment, please call your pharmacy*   Lab Work: ON DAY of Pharmacy Clinic Appointment: Fasting Lipid Panel (nothing to eat or drink 12 hour prior except water)  If you have labs (blood work) drawn today and your tests are completely normal, you will receive your results only by: MyChart Message (if you have MyChart) OR A paper copy in the mail If you have any lab test that is abnormal or we need to change your treatment, we will call you to review the results.   Testing/Procedures: JAN 2025- - Your physician has referred you to see the Pharmacy Clinic for.    Follow-Up: At [facility], you and your health needs are our priority.  As part of our continuing mission to provide you with exceptional heart care, we have created designated Provider Care Teams.  These Care Teams include your primary Cardiologist (physician) and Advanced Practice Providers (APPs -  Physician Assistants and Nurse Practitioners) who all work together to provide you with the care you need, when you need it.    Your next appointment:   5-6 month(s)  Provider:   Christell Constant, MD        Signed, Christell Constant, MD  06/17/2023 4:25 PM    [facility]

## 2023-06-17 NOTE — Telephone Encounter (Signed)
Pt will have labs drawn at next OV.  Dialysis center will not be able to draw labs.

## 2023-06-17 NOTE — Telephone Encounter (Signed)
Rochel, Clinical Manger of [facility], states she received a message from [employer] regarding lab work. However, per their company policy, they are unable to draw labs for outside providers, but they can share labs that were previously drawn.

## 2023-06-17 NOTE — Telephone Encounter (Signed)
Pt will have labs drawn at Pharmacy Clinic appointment.  Pt reports she is a hard stick but will drink before appointment.

## 2023-06-17 NOTE — Patient Instructions (Addendum)
Medication Instructions:  Your physician has recommended you make the following change in your medication:  STOP: metoprolol START: carvedilol (Coreg) 3.125 mg by mouth twice daily on Non Dialysis Days (Tues, Thur, Sat, Sun)  *If you need a refill on your cardiac medications before your next appointment, please call your pharmacy*   Lab Work: ON DAY of Pharmacy Clinic Appointment: Fasting Lipid Panel (nothing to eat or drink 12 hour prior except water)  If you have labs (blood work) drawn today and your tests are completely normal, you will receive your results only by: MyChart Message (if you have MyChart) OR A paper copy in the mail If you have any lab test that is abnormal or we need to change your treatment, we will call you to review the results.   Testing/Procedures: JAN 2025- - Your physician has referred you to see the Pharmacy Clinic for.    Follow-Up: At [facility], you and your health needs are our priority.  As part of our continuing mission to provide you with exceptional heart care, we have created designated Provider Care Teams.  These Care Teams include your primary Cardiologist (physician) and Advanced Practice Providers (APPs -  Physician Assistants and Nurse Practitioners) who all work together to provide you with the care you need, when you need it.    Your next appointment:   5-6 month(s)  Provider:   Christell Constant, MD

## 2023-06-18 DIAGNOSIS — N2581 Secondary hyperparathyroidism of renal origin: Secondary | ICD-10-CM | POA: Diagnosis not present

## 2023-06-18 DIAGNOSIS — T8249XA Other complication of vascular dialysis catheter, initial encounter: Secondary | ICD-10-CM | POA: Diagnosis not present

## 2023-06-18 DIAGNOSIS — N186 End stage renal disease: Secondary | ICD-10-CM | POA: Diagnosis not present

## 2023-06-18 DIAGNOSIS — Z992 Dependence on renal dialysis: Secondary | ICD-10-CM | POA: Diagnosis not present

## 2023-06-19 ENCOUNTER — Telehealth: Payer: Self-pay

## 2023-06-19 ENCOUNTER — Telehealth: Payer: Self-pay | Admitting: Internal Medicine

## 2023-06-19 NOTE — Telephone Encounter (Signed)
Spoke with [facility] at [facility] to obtain cardiac clearance. Pt had f/u with cardiology on 06/17/23. Will await to hear back from cardiology before proceeding with scheduling pt. For AVG.

## 2023-06-19 NOTE — Telephone Encounter (Signed)
   Pre-operative Risk Assessment    Patient Name: [name]  DOB: 09-Jan-1971 MRN: [id number]      Request for Surgical Clearance    Procedure:  INSERTION OF ARTERIOVENOUS (AV) GOR-TEX GRAFT RIGHT ARM   Date of Surgery:  Clearance TBD                                 Surgeon:  Leonie Douglas, MD  Surgeon's Group or Practice Name:  [facility] number:  [phone] Fax number:  [phone]   Type of Clearance Requested:   - Medical    Type of Anesthesia:    Not indicated      Additional requests/questions:    Signed, April Henson   06/19/2023, 11:35 AM

## 2023-06-20 DIAGNOSIS — N186 End stage renal disease: Secondary | ICD-10-CM | POA: Diagnosis not present

## 2023-06-20 DIAGNOSIS — Z992 Dependence on renal dialysis: Secondary | ICD-10-CM | POA: Diagnosis not present

## 2023-06-20 DIAGNOSIS — T8249XA Other complication of vascular dialysis catheter, initial encounter: Secondary | ICD-10-CM | POA: Diagnosis not present

## 2023-06-20 DIAGNOSIS — N2581 Secondary hyperparathyroidism of renal origin: Secondary | ICD-10-CM | POA: Diagnosis not present

## 2023-06-20 NOTE — Telephone Encounter (Signed)
   Patient Name: [name]  DOB: 04-01-1971 MRN: [id number]  Primary Cardiologist: Christell Constant, MD  Chart reviewed as part of pre-operative protocol coverage. Given past medical history and time since last visit, based on ACC/AHA guidelines, [name] is at acceptable risk for the planned procedure without further cardiovascular testing.   Per Dr. Izora Ribas who saw patient in clinic on 06/17/2023: "Though her cardiac risks are high she is well compensated and managed from a cardiac perspective. This is a non-elevate procedure; she has ESRD. I recommend proceeding with surgery."  I will route this recommendation to the requesting party via Epic fax function and remove from pre-op pool.  Please call with questions.  Denyce Robert, NP 06/20/2023, 3:12 PM

## 2023-06-23 DIAGNOSIS — N2581 Secondary hyperparathyroidism of renal origin: Secondary | ICD-10-CM | POA: Diagnosis not present

## 2023-06-23 DIAGNOSIS — Z992 Dependence on renal dialysis: Secondary | ICD-10-CM | POA: Diagnosis not present

## 2023-06-23 DIAGNOSIS — T8249XA Other complication of vascular dialysis catheter, initial encounter: Secondary | ICD-10-CM | POA: Diagnosis not present

## 2023-06-23 DIAGNOSIS — N186 End stage renal disease: Secondary | ICD-10-CM | POA: Diagnosis not present

## 2023-06-24 DIAGNOSIS — H2513 Age-related nuclear cataract, bilateral: Secondary | ICD-10-CM | POA: Diagnosis not present

## 2023-06-24 DIAGNOSIS — H43823 Vitreomacular adhesion, bilateral: Secondary | ICD-10-CM | POA: Diagnosis not present

## 2023-06-24 DIAGNOSIS — H35033 Hypertensive retinopathy, bilateral: Secondary | ICD-10-CM | POA: Diagnosis not present

## 2023-06-24 DIAGNOSIS — E113513 Type 2 diabetes mellitus with proliferative diabetic retinopathy with macular edema, bilateral: Secondary | ICD-10-CM | POA: Diagnosis not present

## 2023-06-25 ENCOUNTER — Other Ambulatory Visit: Payer: Self-pay

## 2023-06-25 DIAGNOSIS — N186 End stage renal disease: Secondary | ICD-10-CM

## 2023-06-25 DIAGNOSIS — Z992 Dependence on renal dialysis: Secondary | ICD-10-CM | POA: Diagnosis not present

## 2023-06-25 DIAGNOSIS — T8249XA Other complication of vascular dialysis catheter, initial encounter: Secondary | ICD-10-CM | POA: Diagnosis not present

## 2023-06-25 DIAGNOSIS — N2581 Secondary hyperparathyroidism of renal origin: Secondary | ICD-10-CM | POA: Diagnosis not present

## 2023-06-27 DIAGNOSIS — N2581 Secondary hyperparathyroidism of renal origin: Secondary | ICD-10-CM | POA: Diagnosis not present

## 2023-06-27 DIAGNOSIS — N186 End stage renal disease: Secondary | ICD-10-CM | POA: Diagnosis not present

## 2023-06-27 DIAGNOSIS — T8249XA Other complication of vascular dialysis catheter, initial encounter: Secondary | ICD-10-CM | POA: Diagnosis not present

## 2023-06-27 DIAGNOSIS — Z992 Dependence on renal dialysis: Secondary | ICD-10-CM | POA: Diagnosis not present

## 2023-06-29 DIAGNOSIS — N186 End stage renal disease: Secondary | ICD-10-CM | POA: Diagnosis not present

## 2023-06-29 DIAGNOSIS — T8249XA Other complication of vascular dialysis catheter, initial encounter: Secondary | ICD-10-CM | POA: Diagnosis not present

## 2023-06-29 DIAGNOSIS — Z992 Dependence on renal dialysis: Secondary | ICD-10-CM | POA: Diagnosis not present

## 2023-06-29 DIAGNOSIS — N2581 Secondary hyperparathyroidism of renal origin: Secondary | ICD-10-CM | POA: Diagnosis not present

## 2023-07-01 DIAGNOSIS — Z992 Dependence on renal dialysis: Secondary | ICD-10-CM | POA: Diagnosis not present

## 2023-07-01 DIAGNOSIS — T8249XA Other complication of vascular dialysis catheter, initial encounter: Secondary | ICD-10-CM | POA: Diagnosis not present

## 2023-07-01 DIAGNOSIS — N186 End stage renal disease: Secondary | ICD-10-CM | POA: Diagnosis not present

## 2023-07-01 DIAGNOSIS — N2581 Secondary hyperparathyroidism of renal origin: Secondary | ICD-10-CM | POA: Diagnosis not present

## 2023-07-04 DIAGNOSIS — Z992 Dependence on renal dialysis: Secondary | ICD-10-CM | POA: Diagnosis not present

## 2023-07-04 DIAGNOSIS — N2581 Secondary hyperparathyroidism of renal origin: Secondary | ICD-10-CM | POA: Diagnosis not present

## 2023-07-04 DIAGNOSIS — T8249XA Other complication of vascular dialysis catheter, initial encounter: Secondary | ICD-10-CM | POA: Diagnosis not present

## 2023-07-04 DIAGNOSIS — N186 End stage renal disease: Secondary | ICD-10-CM | POA: Diagnosis not present

## 2023-07-05 DIAGNOSIS — Z992 Dependence on renal dialysis: Secondary | ICD-10-CM | POA: Diagnosis not present

## 2023-07-05 DIAGNOSIS — N186 End stage renal disease: Secondary | ICD-10-CM | POA: Diagnosis not present

## 2023-07-05 DIAGNOSIS — I129 Hypertensive chronic kidney disease with stage 1 through stage 4 chronic kidney disease, or unspecified chronic kidney disease: Secondary | ICD-10-CM | POA: Diagnosis not present

## 2023-07-07 DIAGNOSIS — N186 End stage renal disease: Secondary | ICD-10-CM | POA: Diagnosis not present

## 2023-07-07 DIAGNOSIS — T8249XA Other complication of vascular dialysis catheter, initial encounter: Secondary | ICD-10-CM | POA: Diagnosis not present

## 2023-07-07 DIAGNOSIS — Z992 Dependence on renal dialysis: Secondary | ICD-10-CM | POA: Diagnosis not present

## 2023-07-07 DIAGNOSIS — N2581 Secondary hyperparathyroidism of renal origin: Secondary | ICD-10-CM | POA: Diagnosis not present

## 2023-07-09 DIAGNOSIS — Z992 Dependence on renal dialysis: Secondary | ICD-10-CM | POA: Diagnosis not present

## 2023-07-09 DIAGNOSIS — N186 End stage renal disease: Secondary | ICD-10-CM | POA: Diagnosis not present

## 2023-07-09 DIAGNOSIS — N2581 Secondary hyperparathyroidism of renal origin: Secondary | ICD-10-CM | POA: Diagnosis not present

## 2023-07-09 DIAGNOSIS — T8249XA Other complication of vascular dialysis catheter, initial encounter: Secondary | ICD-10-CM | POA: Diagnosis not present

## 2023-07-11 DIAGNOSIS — E113513 Type 2 diabetes mellitus with proliferative diabetic retinopathy with macular edema, bilateral: Secondary | ICD-10-CM | POA: Diagnosis not present

## 2023-07-11 DIAGNOSIS — H3582 Retinal ischemia: Secondary | ICD-10-CM | POA: Diagnosis not present

## 2023-07-14 ENCOUNTER — Emergency Department (HOSPITAL_COMMUNITY): Payer: BC Managed Care – PPO

## 2023-07-14 ENCOUNTER — Inpatient Hospital Stay (HOSPITAL_COMMUNITY)
Admission: EM | Admit: 2023-07-14 | Discharge: 2023-07-18 | DRG: 981 | Disposition: A | Discharge status: Home / Self Care | Payer: BC Managed Care – PPO | Attending: Internal Medicine | Admitting: Internal Medicine

## 2023-07-14 ENCOUNTER — Other Ambulatory Visit: Payer: Self-pay

## 2023-07-14 ENCOUNTER — Encounter (HOSPITAL_COMMUNITY): Payer: Self-pay | Admitting: Internal Medicine

## 2023-07-14 DIAGNOSIS — I1A0 Resistant hypertension: Secondary | ICD-10-CM | POA: Diagnosis present

## 2023-07-14 DIAGNOSIS — I252 Old myocardial infarction: Secondary | ICD-10-CM

## 2023-07-14 DIAGNOSIS — I10 Essential (primary) hypertension: Secondary | ICD-10-CM | POA: Diagnosis not present

## 2023-07-14 DIAGNOSIS — Z888 Allergy status to other drugs, medicaments and biological substances status: Secondary | ICD-10-CM

## 2023-07-14 DIAGNOSIS — M549 Dorsalgia, unspecified: Secondary | ICD-10-CM | POA: Diagnosis not present

## 2023-07-14 DIAGNOSIS — I2510 Atherosclerotic heart disease of native coronary artery without angina pectoris: Secondary | ICD-10-CM | POA: Diagnosis not present

## 2023-07-14 DIAGNOSIS — Z992 Dependence on renal dialysis: Secondary | ICD-10-CM

## 2023-07-14 DIAGNOSIS — R1084 Generalized abdominal pain: Secondary | ICD-10-CM | POA: Diagnosis not present

## 2023-07-14 DIAGNOSIS — K2101 Gastro-esophageal reflux disease with esophagitis, with bleeding: Secondary | ICD-10-CM | POA: Diagnosis not present

## 2023-07-14 DIAGNOSIS — N250 Renal osteodystrophy: Secondary | ICD-10-CM | POA: Diagnosis present

## 2023-07-14 DIAGNOSIS — I132 Hypertensive heart and chronic kidney disease with heart failure and with stage 5 chronic kidney disease, or end stage renal disease: Secondary | ICD-10-CM | POA: Diagnosis present

## 2023-07-14 DIAGNOSIS — E782 Mixed hyperlipidemia: Secondary | ICD-10-CM | POA: Diagnosis present

## 2023-07-14 DIAGNOSIS — Z818 Family history of other mental and behavioral disorders: Secondary | ICD-10-CM

## 2023-07-14 DIAGNOSIS — Z8349 Family history of other endocrine, nutritional and metabolic diseases: Secondary | ICD-10-CM

## 2023-07-14 DIAGNOSIS — Z95810 Presence of automatic (implantable) cardiac defibrillator: Secondary | ICD-10-CM | POA: Diagnosis not present

## 2023-07-14 DIAGNOSIS — I5042 Chronic combined systolic (congestive) and diastolic (congestive) heart failure: Secondary | ICD-10-CM | POA: Diagnosis present

## 2023-07-14 DIAGNOSIS — R112 Nausea with vomiting, unspecified: Secondary | ICD-10-CM | POA: Diagnosis present

## 2023-07-14 DIAGNOSIS — N185 Chronic kidney disease, stage 5: Secondary | ICD-10-CM | POA: Diagnosis not present

## 2023-07-14 DIAGNOSIS — Z8674 Personal history of sudden cardiac arrest: Secondary | ICD-10-CM

## 2023-07-14 DIAGNOSIS — E1043 Type 1 diabetes mellitus with diabetic autonomic (poly)neuropathy: Principal | ICD-10-CM | POA: Diagnosis present

## 2023-07-14 DIAGNOSIS — K3184 Gastroparesis: Secondary | ICD-10-CM | POA: Diagnosis present

## 2023-07-14 DIAGNOSIS — K449 Diaphragmatic hernia without obstruction or gangrene: Secondary | ICD-10-CM | POA: Diagnosis not present

## 2023-07-14 DIAGNOSIS — Z5986 Financial insecurity: Secondary | ICD-10-CM

## 2023-07-14 DIAGNOSIS — I428 Other cardiomyopathies: Secondary | ICD-10-CM | POA: Diagnosis present

## 2023-07-14 DIAGNOSIS — K2900 Acute gastritis without bleeding: Secondary | ICD-10-CM | POA: Diagnosis not present

## 2023-07-14 DIAGNOSIS — E876 Hypokalemia: Secondary | ICD-10-CM | POA: Diagnosis present

## 2023-07-14 DIAGNOSIS — R1010 Upper abdominal pain, unspecified: Secondary | ICD-10-CM | POA: Diagnosis not present

## 2023-07-14 DIAGNOSIS — Z8249 Family history of ischemic heart disease and other diseases of the circulatory system: Secondary | ICD-10-CM

## 2023-07-14 DIAGNOSIS — N186 End stage renal disease: Secondary | ICD-10-CM

## 2023-07-14 DIAGNOSIS — Z91018 Allergy to other foods: Secondary | ICD-10-CM

## 2023-07-14 DIAGNOSIS — Z794 Long term (current) use of insulin: Secondary | ICD-10-CM

## 2023-07-14 DIAGNOSIS — R1110 Vomiting, unspecified: Principal | ICD-10-CM

## 2023-07-14 DIAGNOSIS — Z91041 Radiographic dye allergy status: Secondary | ICD-10-CM

## 2023-07-14 DIAGNOSIS — R109 Unspecified abdominal pain: Secondary | ICD-10-CM | POA: Diagnosis not present

## 2023-07-14 DIAGNOSIS — K3189 Other diseases of stomach and duodenum: Secondary | ICD-10-CM | POA: Diagnosis present

## 2023-07-14 DIAGNOSIS — Z8719 Personal history of other diseases of the digestive system: Secondary | ICD-10-CM

## 2023-07-14 DIAGNOSIS — I491 Atrial premature depolarization: Secondary | ICD-10-CM | POA: Diagnosis not present

## 2023-07-14 DIAGNOSIS — K920 Hematemesis: Secondary | ICD-10-CM | POA: Diagnosis not present

## 2023-07-14 DIAGNOSIS — J45909 Unspecified asthma, uncomplicated: Secondary | ICD-10-CM | POA: Diagnosis not present

## 2023-07-14 DIAGNOSIS — K922 Gastrointestinal hemorrhage, unspecified: Secondary | ICD-10-CM

## 2023-07-14 DIAGNOSIS — I953 Hypotension of hemodialysis: Secondary | ICD-10-CM | POA: Diagnosis not present

## 2023-07-14 DIAGNOSIS — Z79899 Other long term (current) drug therapy: Secondary | ICD-10-CM

## 2023-07-14 DIAGNOSIS — Z833 Family history of diabetes mellitus: Secondary | ICD-10-CM

## 2023-07-14 DIAGNOSIS — K429 Umbilical hernia without obstruction or gangrene: Secondary | ICD-10-CM | POA: Diagnosis not present

## 2023-07-14 DIAGNOSIS — Z880 Allergy status to penicillin: Secondary | ICD-10-CM

## 2023-07-14 DIAGNOSIS — K259 Gastric ulcer, unspecified as acute or chronic, without hemorrhage or perforation: Secondary | ICD-10-CM | POA: Diagnosis present

## 2023-07-14 DIAGNOSIS — Z881 Allergy status to other antibiotic agents status: Secondary | ICD-10-CM

## 2023-07-14 DIAGNOSIS — Z452 Encounter for adjustment and management of vascular access device: Secondary | ICD-10-CM | POA: Diagnosis not present

## 2023-07-14 DIAGNOSIS — E1022 Type 1 diabetes mellitus with diabetic chronic kidney disease: Secondary | ICD-10-CM | POA: Diagnosis not present

## 2023-07-14 DIAGNOSIS — Z823 Family history of stroke: Secondary | ICD-10-CM

## 2023-07-14 DIAGNOSIS — E1065 Type 1 diabetes mellitus with hyperglycemia: Secondary | ICD-10-CM | POA: Diagnosis not present

## 2023-07-14 DIAGNOSIS — D631 Anemia in chronic kidney disease: Secondary | ICD-10-CM | POA: Diagnosis not present

## 2023-07-14 DIAGNOSIS — R079 Chest pain, unspecified: Secondary | ICD-10-CM | POA: Diagnosis not present

## 2023-07-14 DIAGNOSIS — R1013 Epigastric pain: Secondary | ICD-10-CM | POA: Diagnosis not present

## 2023-07-14 DIAGNOSIS — K2211 Ulcer of esophagus with bleeding: Secondary | ICD-10-CM | POA: Diagnosis not present

## 2023-07-14 DIAGNOSIS — I120 Hypertensive chronic kidney disease with stage 5 chronic kidney disease or end stage renal disease: Secondary | ICD-10-CM | POA: Diagnosis not present

## 2023-07-14 DIAGNOSIS — Z7982 Long term (current) use of aspirin: Secondary | ICD-10-CM

## 2023-07-14 DIAGNOSIS — R000 Tachycardia, unspecified: Secondary | ICD-10-CM | POA: Diagnosis not present

## 2023-07-14 DIAGNOSIS — N2581 Secondary hyperparathyroidism of renal origin: Secondary | ICD-10-CM | POA: Diagnosis present

## 2023-07-14 DIAGNOSIS — J189 Pneumonia, unspecified organism: Secondary | ICD-10-CM | POA: Diagnosis not present

## 2023-07-14 LAB — CBC WITH DIFFERENTIAL/PLATELET
Abs Immature Granulocytes: 0.02 10*3/uL (ref 0.00–0.07)
Basophils Absolute: 0 10*3/uL (ref 0.0–0.1)
Basophils Relative: 1 %
Eosinophils Absolute: 0 10*3/uL (ref 0.0–0.5)
Eosinophils Relative: 0 %
HCT: 34 % — ABNORMAL LOW (ref 36.0–46.0)
Hemoglobin: 11.1 g/dL — ABNORMAL LOW (ref 12.0–15.0)
Immature Granulocytes: 0 %
Lymphocytes Relative: 21 %
Lymphs Abs: 1.7 10*3/uL (ref 0.7–4.0)
MCH: 29.4 pg (ref 26.0–34.0)
MCHC: 32.6 g/dL (ref 30.0–36.0)
MCV: 89.9 fL (ref 80.0–100.0)
Monocytes Absolute: 0.4 10*3/uL (ref 0.1–1.0)
Monocytes Relative: 5 %
Neutro Abs: 5.9 10*3/uL (ref 1.7–7.7)
Neutrophils Relative %: 73 %
Platelets: 249 10*3/uL (ref 150–400)
RBC: 3.78 MIL/uL — ABNORMAL LOW (ref 3.87–5.11)
RDW: 14 % (ref 11.5–15.5)
WBC: 8 10*3/uL (ref 4.0–10.5)
nRBC: 0 % (ref 0.0–0.2)

## 2023-07-14 LAB — CBC
HCT: 34.5 % — ABNORMAL LOW (ref 36.0–46.0)
Hemoglobin: 11.2 g/dL — ABNORMAL LOW (ref 12.0–15.0)
MCH: 29.6 pg (ref 26.0–34.0)
MCHC: 32.5 g/dL (ref 30.0–36.0)
MCV: 91 fL (ref 80.0–100.0)
Platelets: 241 10*3/uL (ref 150–400)
RBC: 3.79 MIL/uL — ABNORMAL LOW (ref 3.87–5.11)
RDW: 14 % (ref 11.5–15.5)
WBC: 8.4 10*3/uL (ref 4.0–10.5)
nRBC: 0 % (ref 0.0–0.2)

## 2023-07-14 LAB — CBG MONITORING, ED
Glucose-Capillary: 264 mg/dL — ABNORMAL HIGH (ref 70–99)
Glucose-Capillary: 273 mg/dL — ABNORMAL HIGH (ref 70–99)
Glucose-Capillary: 223 mg/dL — ABNORMAL HIGH (ref 70–99)
Glucose-Capillary: 152 mg/dL — ABNORMAL HIGH (ref 70–99)

## 2023-07-14 LAB — COMPREHENSIVE METABOLIC PANEL
ALT: 8 U/L (ref 0–44)
ALT: 7 U/L (ref 0–44)
AST: 13 U/L — ABNORMAL LOW (ref 15–41)
AST: 14 U/L — ABNORMAL LOW (ref 15–41)
Albumin: 4 g/dL (ref 3.5–5.0)
Albumin: 3.8 g/dL (ref 3.5–5.0)
Alkaline Phosphatase: 68 U/L (ref 38–126)
Alkaline Phosphatase: 68 U/L (ref 38–126)
Anion gap: 19 — ABNORMAL HIGH (ref 5–15)
Anion gap: 17 — ABNORMAL HIGH (ref 5–15)
BUN: 49 mg/dL — ABNORMAL HIGH (ref 6–20)
BUN: 56 mg/dL — ABNORMAL HIGH (ref 6–20)
CO2: 19 mmol/L — ABNORMAL LOW (ref 22–32)
CO2: 23 mmol/L (ref 22–32)
Calcium: 9.3 mg/dL (ref 8.9–10.3)
Calcium: 9.4 mg/dL (ref 8.9–10.3)
Chloride: 99 mmol/L (ref 98–111)
Chloride: 101 mmol/L (ref 98–111)
Creatinine, Ser: 7.69 mg/dL — ABNORMAL HIGH (ref 0.44–1.00)
Creatinine, Ser: 8.35 mg/dL — ABNORMAL HIGH (ref 0.44–1.00)
GFR, Estimated: 6 mL/min — ABNORMAL LOW (ref >60)
GFR, Estimated: 5 mL/min — ABNORMAL LOW (ref >60)
Glucose, Bld: 261 mg/dL — ABNORMAL HIGH (ref 70–99)
Glucose, Bld: 181 mg/dL — ABNORMAL HIGH (ref 70–99)
Potassium: 3.4 mmol/L — ABNORMAL LOW (ref 3.5–5.1)
Potassium: 3.7 mmol/L (ref 3.5–5.1)
Sodium: 137 mmol/L (ref 135–145)
Sodium: 141 mmol/L (ref 135–145)
Total Bilirubin: 1.7 mg/dL — ABNORMAL HIGH (ref <1.2)
Total Bilirubin: 1.6 mg/dL — ABNORMAL HIGH (ref <1.2)
Total Protein: 8 g/dL (ref 6.5–8.1)
Total Protein: 7.5 g/dL (ref 6.5–8.1)

## 2023-07-14 LAB — URINALYSIS, ROUTINE W REFLEX MICROSCOPIC
Bilirubin Urine: NEGATIVE
Glucose, UA: >=500 mg/dL — AB
Hgb urine dipstick: NEGATIVE
Ketones, ur: 20 mg/dL — AB
Leukocytes,Ua: NEGATIVE
Nitrite: NEGATIVE
Protein, ur: >=300 mg/dL — AB
Specific Gravity, Urine: 1.012 (ref 1.005–1.030)
pH: 8 (ref 5.0–8.0)

## 2023-07-14 LAB — TROPONIN I (HIGH SENSITIVITY)
Troponin I (High Sensitivity): 78 ng/L — ABNORMAL HIGH (ref <18)
Troponin I (High Sensitivity): 78 ng/L — ABNORMAL HIGH (ref <18)

## 2023-07-14 LAB — I-STAT CHEM 8, ED
BUN: 50 mg/dL — ABNORMAL HIGH (ref 6–20)
Calcium, Ion: 0.95 mmol/L — ABNORMAL LOW (ref 1.15–1.40)
Chloride: 105 mmol/L (ref 98–111)
Creatinine, Ser: 8.6 mg/dL — ABNORMAL HIGH (ref 0.44–1.00)
Glucose, Bld: 272 mg/dL — ABNORMAL HIGH (ref 70–99)
HCT: 35 % — ABNORMAL LOW (ref 36.0–46.0)
Hemoglobin: 11.9 g/dL — ABNORMAL LOW (ref 12.0–15.0)
Potassium: 3.5 mmol/L (ref 3.5–5.1)
Sodium: 137 mmol/L (ref 135–145)
TCO2: 24 mmol/L (ref 22–32)

## 2023-07-14 LAB — HEPATITIS B SURFACE ANTIGEN: Hepatitis B Surface Ag: NONREACTIVE

## 2023-07-14 LAB — TYPE AND SCREEN
ABO/RH(D): A POS
Antibody Screen: NEGATIVE

## 2023-07-14 LAB — I-STAT CG4 LACTIC ACID, ED
Lactic Acid, Venous: 1.5 mmol/L (ref 0.5–1.9)
Lactic Acid, Venous: 0.9 mmol/L (ref 0.5–1.9)

## 2023-07-14 LAB — PHOSPHORUS: Phosphorus: 5.5 mg/dL — ABNORMAL HIGH (ref 2.5–4.6)

## 2023-07-14 LAB — MAGNESIUM: Magnesium: 2.2 mg/dL (ref 1.7–2.4)

## 2023-07-14 LAB — BETA-HYDROXYBUTYRIC ACID: Beta-Hydroxybutyric Acid: 3.38 mmol/L — ABNORMAL HIGH (ref 0.05–0.27)

## 2023-07-14 LAB — LIPASE, BLOOD: Lipase: 23 U/L (ref 11–51)

## 2023-07-14 MED ORDER — HYDROMORPHONE HCL 1 MG/ML IJ SOLN
0.5000 mg | Freq: Once | INTRAMUSCULAR | Status: AC
  Administered 2023-07-14: 0.5 mg via INTRAVENOUS
  Filled 2023-07-14: qty 1

## 2023-07-14 MED ORDER — PROCHLORPERAZINE EDISYLATE 10 MG/2ML IJ SOLN
10.0000 mg | Freq: Once | INTRAMUSCULAR | Status: AC
  Administered 2023-07-14: 10 mg via INTRAVENOUS
  Filled 2023-07-14: qty 2

## 2023-07-14 MED ORDER — INSULIN ASPART 100 UNIT/ML IJ SOLN
0.0000 [IU] | INTRAMUSCULAR | Status: DC
  Administered 2023-07-14: 3 [IU] via SUBCUTANEOUS
  Administered 2023-07-14 – 2023-07-15 (×3): 1 [IU] via SUBCUTANEOUS

## 2023-07-14 MED ORDER — PANTOPRAZOLE SODIUM 40 MG IV SOLR: 40.0000 mg | Freq: Two times a day (BID) | INTRAVENOUS | Status: DC

## 2023-07-14 MED ORDER — LIDOCAINE-PRILOCAINE 2.5-2.5 % EX CREA: 1.0000 | TOPICAL_CREAM | CUTANEOUS | Status: DC | PRN

## 2023-07-14 MED ORDER — ALBUTEROL SULFATE (2.5 MG/3ML) 0.083% IN NEBU: 2.5000 mg | INHALATION_SOLUTION | RESPIRATORY_TRACT | Status: DC | PRN

## 2023-07-14 MED ORDER — SODIUM CHLORIDE 0.9 % IV SOLN: INTRAVENOUS | Status: DC

## 2023-07-14 MED ORDER — SODIUM CHLORIDE 0.9 % IV BOLUS
500.0000 mL | Freq: Once | INTRAVENOUS | Status: AC
  Administered 2023-07-14: 500 mL via INTRAVENOUS

## 2023-07-14 MED ORDER — HYDRALAZINE HCL 20 MG/ML IJ SOLN
20.0000 mg | Freq: Once | INTRAMUSCULAR | Status: AC
  Administered 2023-07-14: 20 mg via INTRAVENOUS
  Filled 2023-07-14: qty 1

## 2023-07-14 MED ORDER — HYDROMORPHONE HCL 1 MG/ML IJ SOLN
1.0000 mg | Freq: Once | INTRAMUSCULAR | Status: AC
  Administered 2023-07-14: 1 mg via INTRAVENOUS
  Filled 2023-07-14: qty 1

## 2023-07-14 MED ORDER — SODIUM CHLORIDE 0.9 % IV SOLN
12.5000 mg | Freq: Four times a day (QID) | INTRAVENOUS | Status: DC | PRN
  Administered 2023-07-15 – 2023-07-17 (×2): 12.5 mg via INTRAVENOUS
  Filled 2023-07-14 (×2): qty 0.5
  Filled 2023-07-14: qty 12.5

## 2023-07-14 MED ORDER — INSULIN ASPART 100 UNIT/ML IJ SOLN: 0.0000 [IU] | INTRAMUSCULAR | Status: DC

## 2023-07-14 MED ORDER — PANTOPRAZOLE SODIUM 40 MG IV SOLR
40.0000 mg | INTRAVENOUS | Status: AC
  Administered 2023-07-14 (×2): 40 mg via INTRAVENOUS
  Filled 2023-07-14 (×2): qty 10

## 2023-07-14 MED ORDER — PANTOPRAZOLE SODIUM 40 MG IV SOLR
40.0000 mg | INTRAVENOUS | Status: DC
Start: 2023-07-14 — End: 2023-07-14

## 2023-07-14 MED ORDER — PANTOPRAZOLE SODIUM 40 MG IV SOLR
40.0000 mg | Freq: Once | INTRAVENOUS | Status: AC
  Administered 2023-07-14: 40 mg via INTRAVENOUS
  Filled 2023-07-14: qty 10

## 2023-07-14 MED ORDER — LIDOCAINE HCL (PF) 1 % IJ SOLN: 5.0000 mL | INTRAMUSCULAR | Status: DC | PRN

## 2023-07-14 MED ORDER — INSULIN ASPART 100 UNIT/ML IJ SOLN: 0.0000 [IU] | INTRAMUSCULAR | Status: DC | PRN

## 2023-07-14 MED ORDER — DIPHENHYDRAMINE HCL 50 MG/ML IJ SOLN
25.0000 mg | Freq: Once | INTRAMUSCULAR | Status: AC
  Administered 2023-07-14: 25 mg via INTRAVENOUS
  Filled 2023-07-14: qty 1

## 2023-07-14 MED ORDER — PANTOPRAZOLE SODIUM 40 MG IV SOLR
40.0000 mg | Freq: Three times a day (TID) | INTRAVENOUS | Status: AC
  Administered 2023-07-14 – 2023-07-17 (×9): 40 mg via INTRAVENOUS
  Filled 2023-07-14 (×8): qty 10

## 2023-07-14 MED ORDER — ALTEPLASE 2 MG IJ SOLR: 2.0000 mg | Freq: Once | INTRAMUSCULAR | Status: DC | PRN

## 2023-07-14 MED ORDER — CHLORHEXIDINE GLUCONATE CLOTH 2 % EX PADS
6.0000 | MEDICATED_PAD | Freq: Every day | CUTANEOUS | Status: DC
  Administered 2023-07-15: 6 via TOPICAL

## 2023-07-14 MED ORDER — HEPARIN SODIUM (PORCINE) 1000 UNIT/ML DIALYSIS
1000.0000 [IU] | INTRAMUSCULAR | Status: DC | PRN
  Administered 2023-07-17: 5000 [IU]

## 2023-07-14 MED ORDER — SODIUM CHLORIDE 0.9 % IV SOLN
12.5000 mg | Freq: Four times a day (QID) | INTRAVENOUS | Status: DC | PRN
  Administered 2023-07-14: 12.5 mg via INTRAVENOUS
  Filled 2023-07-14: qty 0.5

## 2023-07-14 MED ORDER — ANTICOAGULANT SODIUM CITRATE 4% (200MG/5ML) IV SOLN: 5.0000 mL | Status: DC | PRN

## 2023-07-14 MED ORDER — PANTOPRAZOLE SODIUM 40 MG IV SOLR
40.0000 mg | Freq: Two times a day (BID) | INTRAVENOUS | Status: DC
  Administered 2023-07-17 – 2023-07-18 (×2): 40 mg via INTRAVENOUS
  Filled 2023-07-14 (×3): qty 10

## 2023-07-14 MED ORDER — PENTAFLUOROPROP-TETRAFLUOROETH EX AERO: 1.0000 | INHALATION_SPRAY | CUTANEOUS | Status: DC | PRN

## 2023-07-14 MED ORDER — PANTOPRAZOLE SODIUM 40 MG IV SOLR: 40.0000 mg | Freq: Three times a day (TID) | INTRAVENOUS | Status: DC

## 2023-07-14 NOTE — ED Notes (Signed)
Pt to CT at this time.

## 2023-07-14 NOTE — ED Notes (Signed)
Patient used the bedpan, emptied bladder before Ride to Dialysis.

## 2023-07-14 NOTE — Inpatient Diabetes Management (Signed)
Inpatient Diabetes Program Recommendations  AACE/ADA: New Consensus Statement on Inpatient Glycemic Control   Target Ranges:  Prepandial:   less than 140 mg/dL      Peak postprandial:   less than 180 mg/dL (1-2 hours)      Critically ill patients:  140 - 180 mg/dL    Latest Reference Range & Units 07/14/23 07:07  Glucose-Capillary 70 - 99 mg/dL 161 (H)    Latest Reference Range & Units 07/14/23 06:45 07/14/23 07:03  CO2 22 - 32 mmol/L 19 (L)   Glucose 70 - 99 mg/dL 096 (H) 045 (H)  Anion gap 5 - 15  19 (H)    Review of Glycemic Control  Diabetes history: DM1 Outpatient Diabetes medications: OmniPod insulin pump with Novolog Current orders for Inpatient glycemic control: None; in ED  Inpatient Diabetes Program Recommendations:    Insulin: Since patient does not have on her insulin pump, please use Glycemic Control order set to order CBGs Q4H and Novolog 0-9 units Q4H. If patient is admitted, please order Semglee 15 units Q24H.  NOTE: Patient in ED currently via EMS for nausea and vomiting, abdominal pain, and dark colored emesis. Patient has Type 1 DM and uses an insulin pump for DM control. Per Madelyn, RN patient does not have on her insulin pump and patient reports that she did not wear it to the hospital. Since patient has DM1, she is high risk for developing DKA. Asked RN to ask attending provider to order CBGs Q4H and Novolog correction 0-9 units Q4H while in ED. If admitted, will also need to be ordered basal insulin.   Thanks, Orlando Penner, RN, MSN, CDCES Diabetes Coordinator Inpatient Diabetes Program [phone] (Team Pager from 8am to 5pm)

## 2023-07-14 NOTE — Anesthesia Preprocedure Evaluation (Signed)
Anesthesia Evaluation  Patient identified by MRN, date of birth, ID band Patient awake    Reviewed: Allergy & Precautions, NPO status , Patient's Chart, lab work & pertinent test results, reviewed documented beta blocker date and time   Airway Mallampati: II  TM Distance: >3 FB     Dental  (+) Missing, Chipped, Dental Advisory Given, Caps   Pulmonary asthma , pneumonia, resolved   Pulmonary exam normal breath sounds clear to auscultation       Cardiovascular hypertension, Pt. on medications + CAD, + Past MI and +CHF  Normal cardiovascular exam+ Cardiac Defibrillator  Rhythm:Regular Rate:Normal  EKG 07/14/23 Sinus Tachycardia Ventricular premature complex Probable left atrial enlargement Probable LVH with secondary repol abnormality  Echo 06/16/23 1. Left ventricular ejection fraction, by estimation, is 35 to 40%. The  left ventricle has moderately decreased function. The left ventricle  demonstrates global hypokinesis. Left ventricular diastolic parameters are  consistent with Grade II diastolic  dysfunction (pseudonormalization). Elevated left ventricular end-diastolic  pressure. The average left ventricular global longitudinal strain is -8.9  %. The global longitudinal strain is abnormal.   2. Right ventricular systolic function is normal. The right ventricular  size is normal. There is normal pulmonary artery systolic pressure.   3. Left atrial size was severely dilated.   4. The mitral valve is degenerative. Trivial mitral valve regurgitation.  No evidence of mitral stenosis.   5. The aortic valve is tricuspid. Aortic valve regurgitation is not  visualized. No aortic stenosis is present.   6. The inferior vena cava is normal in size with greater than 50%  respiratory variability, suggesting right atrial pressure of 3 mmHg.      Neuro/Psych  Headaches PSYCHIATRIC DISORDERS Anxiety Depression    ClaustrophobiaDiabetic  neuropathy    GI/Hepatic Neg liver ROS,,,Gastroparesis Hematemesis N/V   Endo/Other  diabetes, Poorly Controlled, Type 1, Insulin Dependent  Hyperlipidemia  Renal/GU ESRF and DialysisRenal diseaseLast dialysis this am  negative genitourinary   Musculoskeletal  (+) Arthritis , Osteoarthritis,    Abdominal   Peds  Hematology  (+) Blood dyscrasia, anemia   Anesthesia Other Findings   Reproductive/Obstetrics                              Anesthesia Physical Anesthesia Plan  ASA: 4  Anesthesia Plan: General   Post-op Pain Management: Minimal or no pain anticipated   Induction: Intravenous, Rapid sequence and Cricoid pressure planned  PONV Risk Score and Plan: 4 or greater and Treatment may vary due to age or medical condition, Ondansetron and TIVA  Airway Management Planned: Oral ETT  Additional Equipment: None  Intra-op Plan:   Post-operative Plan: Extubation in OR  Informed Consent: I have reviewed the patients History and Physical, chart, labs and discussed the procedure including the risks, benefits and alternatives for the proposed anesthesia with the patient or authorized representative who has indicated his/her understanding and acceptance.     Dental advisory given  Plan Discussed with: CRNA and Anesthesiologist  Anesthesia Plan Comments:          Anesthesia Quick Evaluation

## 2023-07-14 NOTE — Consult Note (Signed)
[facility] Renal Consultation Note    Indication for Consultation:  Management of ESRD/hemodialysis, anemia, hypertension/volume, and secondary hyperparathyroidism.  HPI: [name] is a 52 y.o. female with PMH including ESRD on dialysis, CHF, diabetes mellitus, T1DM and prior MI who presented to the ED with 10/10 abdominal pain, dark colored emesis, and HTN. She reports she was unable to keep her medications down. She felt like her emesis appeared to be dark blood. GI was consulted and recommended protonix drip and EGD tomorrow. She was treated with IV hydralazine, dilaudid, and phenergan. Patient reports she is feeling much better after her medications today. Reports she missed dialysis due to the nausea and vomiting. Patient's last dialysis was 07/09/23. She missed dialysis on Saturday and treatment on 12/4 was only 2 hours 48 minutes. Labs today notable for K+ 3.5, Cr 8.6, Ca 9.3, Phos 5.5, Alb 4.0, Hgb 11.9. CXR unremarkable.   She denies SOB, CP, dizziness, fever, chills. Reports intermittent jerking movements since yesterday. Denies abdominal pain and nausea at present. Denies orthopnea, edema, constipation, diarrhea, dysuria, hematuria, and flank pain. Has a [facility] for dialysis access and reports she was scheduled to get a graft later this week.   Past Medical History:  Diagnosis Date   AICD (automatic cardioverter/defibrillator) present    [employer]   Anemia    Anxiety    Arthritis    Asthma    CHF (congestive heart failure) (HCC)    Chronic diastolic CHF (congestive heart failure) (HCC) 12/14/2019   Depression    Diabetic ulcer of left foot (HCC) 05/12/2013   ESRD on hemodialysis (HCC)    [facility]   Gastroparesis    Generalized abdominal pain    History of chicken pox    Loss of weight 12/02/2019   Migraines    Mixed hyperlipidemia 09/05/2022   Mood disorder (HCC)    anxiety   Myocardial infarction (HCC)    Prurigo nodularis    with diabetic  dermopathy   Type 1 diabetes mellitus (HCC) 05/23/2022   Type 1 diabetes, uncontrolled, with neuropathy    [place]   Ulcers of both lower legs (HCC) 02/20/2014   Past Surgical History:  Procedure Laterality Date   AV FISTULA PLACEMENT Left 10/01/2022   Procedure: ARTERIOVENOUS (AV)FISTULA CREATION;  Surgeon: Larina Earthly, MD;  Location: [facility];  Service: Vascular;  Laterality: Left;   AV FISTULA PLACEMENT Right 11/19/2022   Procedure: INSERTION OF RIGHT ARM ARTERIOVENOUS (AV) GORE-TEX GRAFT;  Surgeon: Chuck Hint, MD;  Location: [facility] OR;  Service: Vascular;  Laterality: Right;   BIOPSY  08/11/2020   Procedure: BIOPSY;  Surgeon: Meryl Dare, MD;  Location: [facility];  Service: Endoscopy;;   BIOPSY  04/02/2022   Procedure: BIOPSY;  Surgeon: Lynann Bologna, MD;  Location: [facility] ENDOSCOPY;  Service: Gastroenterology;;   ESOPHAGOGASTRODUODENOSCOPY (EGD) WITH PROPOFOL N/A 08/11/2020   Procedure: ESOPHAGOGASTRODUODENOSCOPY (EGD) WITH PROPOFOL;  Surgeon: Meryl Dare, MD;  Location: [facility];  Service: Endoscopy;  Laterality: N/A;   ESOPHAGOGASTRODUODENOSCOPY (EGD) WITH PROPOFOL N/A 04/02/2022   Procedure: ESOPHAGOGASTRODUODENOSCOPY (EGD) WITH PROPOFOL;  Surgeon: Lynann Bologna, MD;  Location: [facility];  Service: Gastroenterology;  Laterality: N/A;   ESOPHAGOGASTRODUODENOSCOPY (EGD) WITH PROPOFOL N/A 05/23/2022   Procedure: ESOPHAGOGASTRODUODENOSCOPY (EGD) WITH PROPOFOL;  Surgeon: Midge Minium, MD;  Location: [facility] ENDOSCOPY;  Service: Endoscopy;  Laterality: N/A;   ICD IMPLANT N/A 04/09/2022   Procedure: ICD IMPLANT;  Surgeon: [name], MD;  Location: [facility] INVASIVE CV LAB;  Service: Cardiovascular;  Laterality: N/A;   IR FLUORO GUIDE CV LINE RIGHT  10/07/2022   IR US GUIDE VASC ACCESS RIGHT  10/07/2022   treadmill stress test  01/2013   WNL, low risk study   Family History  Problem Relation Age of Onset   Diabetes Mother        type 2   Hypertension Mother    Thyroid  disease Mother    Bipolar disorder Mother    Heart disease Mother    Calcium disorder Mother    Cancer Maternal Grandmother        Breast, stomach   Cancer Paternal Grandmother        stomach, lung (smoker)   Diabetes Paternal Grandmother    Diabetes Paternal Grandfather    Heart failure Sister    Diabetes Sister    Stroke Maternal Aunt    Cancer Maternal Uncle        prostate   CAD Maternal Aunt        stents   Cancer Maternal Aunt 105       ovarian   Social History:  reports that she has never smoked. She has never used smokeless tobacco. She reports that she does not currently use alcohol. She reports that she does not use drugs.  ROS: As per HPI otherwise negative.   Physical Exam: Vitals:   07/14/23 0852 07/14/23 1000 07/14/23 1009 07/14/23 1451  BP: (!) 196/108 (!) 196/106  (!) 179/104  Pulse: (!) 114 (!) 116 (!) 117 (!) 112  Resp: (!) 23 (!) 23 17 20   Temp:   98.5 F (36.9 C) 98.4 F (36.9 C)  TempSrc:   Oral Oral  SpO2: 96% 96% 97% 97%     General: Well developed, well nourished, in no acute distress. Head: Normocephalic, atraumatic, sclera non-icteric, mucus membranes are moist. Lungs: Clear bilaterally to auscultation without wheezes, rales, or rhonchi. Breathing is unlabored on RA Heart: RRR with normal S1, S2. No murmurs, rubs, or gallops appreciated. Abdomen: Soft,  non-distended with normoactive bowel sounds.  No obvious abdominal masses. Musculoskeletal:  Strength and tone appear normal for age. Lower extremities: No edema or ischemic changes. Neuro: Alert and oriented X 3. Moves all extremities spontaneously. Psych:  Responds to questions appropriately with a normal affect. Dialysis Access: R internal jugular TDC  Allergies  Allergen Reactions   Atorvastatin Other (See Comments)    Dizziness    Dulaglutide Nausea And Vomiting and Other (See Comments)    Caused pancreatitis    Ivp Dye [Iodinated Contrast Media] Hives, Itching and Nausea And  Vomiting    tremors   Metoclopramide Other (See Comments)    Possible movement disorder or tardive dyskinesia while on reglan Long term   Other Itching    Peas and carrots   Sertraline Nausea And Vomiting   Vancomycin Itching and Swelling   Zofran [Ondansetron] Nausea And Vomiting and Other (See Comments)    Causes nausea vomiting to worsen / doesn't really work for patient.   Amoxicillin Itching and Rash   Lantus [Insulin Glargine] Itching and Rash    Has tolerated insulin glargine many times with no reported issues   Miconazole Nitrate Nausea And Vomiting and Rash   Prior to Admission medications   Medication Sig Start Date End Date Taking? Authorizing Provider  acetaminophen (TYLENOL) 500 MG tablet Take 1,000 mg by mouth every 8 (eight) hours as needed for moderate pain or mild pain.   Yes [provider]  carvedilol (  COREG) 3.125 MG tablet Take 1 tablet (3.125 mg total) by mouth 4 (four) times a week. Take twice daily on non Dialysis days (Tues,Thur,Sat, Sun) Patient taking differently: Take 3.125 mg by mouth in the morning and at bedtime. 06/17/23  Yes Chandrasekhar, Mahesh A, MD  cloNIDine (CATAPRES) 0.1 MG tablet Take 0.1 mg by mouth daily as needed (bp > 180/120). 05/08/23  Yes [provider]  hydrALAZINE (APRESOLINE) 50 MG tablet Take 1.5 tablets (75 mg total) by mouth 3 (three) times daily. Patient taking differently: Take 50 mg by mouth 3 (three) times daily. 12/10/22  Yes Chandrasekhar, Mahesh A, MD  LORazepam (ATIVAN) 0.5 MG tablet Take 1 tablet (0.5 mg total) by mouth every 8 (eight) hours as needed for anxiety (Refractory nausea and vomiting). Patient taking differently: Take 0.5 mg by mouth every 6 (six) hours as needed for anxiety (Refractory nausea and vomiting). 07/17/22  Yes Zannie Cove, MD  metoprolol succinate (TOPROL-XL) 50 MG 24 hr tablet Take 50 mg by mouth daily. Take with or immediately following a meal.   Yes [provider]   RABEprazole (ACIPHEX) 20 MG tablet Take 20 mg by mouth 2 (two) times daily.   Yes [provider]  albuterol (PROAIR HFA) 108 (90 Base) MCG/ACT inhaler Inhale 2 puffs into the lungs every 6 (six) hours as needed for wheezing or shortness of breath. Patient not taking: Reported on 07/14/2023 07/03/20   Waldon Merl, PA-C  aspirin EC 81 MG tablet Take 1 tablet (81 mg total) by mouth daily. Swallow whole. 10/16/22   Hughie Closs, MD  ezetimibe (ZETIA) 10 MG tablet Take 1 tablet (10 mg total) by mouth daily. 12/09/22   Christell Constant, MD  famotidine (PEPCID) 40 MG tablet Take 40 mg by mouth daily.    [provider]  hydrOXYzine (ATARAX) 50 MG tablet Take 50 mg by mouth 3 (three) times daily as needed for anxiety.    [provider]  insulin aspart (NOVOLOG) 100 UNIT/ML injection Inject 120 Units into the skin See admin instructions. Patient uses this with the Omnipod insulin pump  its vary according to blood sugar. Loads 120 units every 3 days. Patient taking differently: Inject 150 Units into the skin See admin instructions. Patient uses this with the Omnipod insulin pump  its vary according to blood sugar. Loads 150 units every 3 days. 05/02/23   Ghimire, Werner Lean, MD  Insulin Disposable Pump (OMNIPOD DASH PODS, GEN 4,) MISC Place 1 Applicatorful onto the skin See admin instructions. Every three days    [provider]  isosorbide dinitrate (ISORDIL) 20 MG tablet Take 20 mg by mouth 3 (three) times daily. 10/18/22   [provider]  Methoxy PEG-Epoetin Beta (MIRCERA IJ) as directed. [facility] 11/27/22 11/26/23  [provider]  metoCLOPramide (REGLAN) 5 MG tablet Take 5-10 mg by mouth every 8 (eight) hours as needed for vomiting or nausea.    [provider]  nitroGLYCERIN (NITROSTAT) 0.4 MG SL tablet Place 1 tablet (0.4 mg total) under the tongue every 5 (five) minutes as needed for chest pain. 09/05/22 06/17/23  Christell Constant, MD  olmesartan (BENICAR) 20 MG tablet Take 20 mg by mouth daily. 05/29/23   [provider]  polyethylene glycol (MIRALAX / GLYCOLAX) 17 g packet Take 17 g by mouth daily as needed for moderate constipation.    [provider]  pravastatin (PRAVACHOL) 40 MG tablet Take 1 tablet (40 mg total) by mouth every evening.  12/09/22   Chandrasekhar, Mahesh A, MD  pregabalin (LYRICA) 50 MG capsule Take 50 mg by mouth daily as needed (neuropathy). 12/05/21   [provider]  prochlorperazine (COMPAZINE) 5 MG tablet Take 1 tablet (5 mg total) by mouth every 6 (six) hours as needed for nausea or vomiting. 05/01/23   Ghimire, Werner Lean, MD  scopolamine (TRANSDERM-SCOP) 1 MG/3DAYS Place 1 patch (1.5 mg total) onto the skin every 3 (three) days. 05/26/22   Rai, Ripudeep K, MD  tobramycin (TOBREX) 0.3 % ophthalmic solution Place 1 drop into both eyes See admin instructions. Instill 1 drop into both eyes once every 8 weeks before eye injections 04/21/23   [provider]  torsemide (DEMADEX) 20 MG tablet Take 20 mg by mouth daily as needed (swelling).    [provider]  TRESIBA FLEXTOUCH 100 UNIT/ML FlexTouch Pen Inject into the skin as needed (when insulin pump is not working). 06/02/23   [provider]  Vitamin D, Ergocalciferol, (DRISDOL) 1.25 MG (50000 UNIT) CAPS capsule Take 50,000 Units by mouth once a week. 02/10/23   [provider]  VOQUEZNA 10 MG TABS Take 1 tablet by mouth daily.    [provider]   Current Facility-Administered Medications  Medication Dose Route Frequency Provider Last Rate Last Admin   insulin aspart (novoLOG) injection 0-6 Units  0-6 Units Subcutaneous Q4H Lonell Grandchild, MD   3 Units at 07/14/23 1033   pantoprazole (PROTONIX) injection 40 mg  40 mg Intravenous Q8H Lurline Del, MD       Followed by   Melene Muller ON 07/17/2023] pantoprazole (PROTONIX) injection 40 mg  40 mg Intravenous Q12H Lurline Del, MD       promethazine (PHENERGAN) 12.5 mg in sodium chloride 0.9 % 50 mL IVPB  12.5 mg Intravenous Q6H PRN Prosperi, Christian H, PA-C       Current Outpatient Medications  Medication Sig Dispense Refill   acetaminophen (TYLENOL) 500 MG tablet Take 1,000 mg by mouth every 8 (eight) hours as needed for moderate pain or mild pain.     carvedilol (COREG) 3.125 MG tablet Take 1 tablet (3.125 mg total) by mouth 4 (four) times a week. Take twice daily on non Dialysis days (Tues,Thur,Sat, Sun) (Patient taking differently: Take 3.125 mg by mouth in the morning and at bedtime.) 96 tablet 3   cloNIDine (CATAPRES) 0.1 MG tablet Take 0.1 mg by mouth daily as needed (bp > 180/120).     hydrALAZINE (APRESOLINE) 50 MG tablet Take 1.5 tablets (75 mg total) by mouth 3 (three) times daily. (Patient taking differently: Take 50 mg by mouth 3 (three) times daily.) 405 tablet 3   LORazepam (ATIVAN) 0.5 MG tablet Take 1 tablet (0.5 mg total) by mouth every 8 (eight) hours as needed for anxiety (Refractory nausea and vomiting). (Patient taking differently: Take 0.5 mg by mouth every 6 (six) hours as needed for anxiety (Refractory nausea and vomiting).) 30 tablet 0   metoprolol succinate (TOPROL-XL) 50 MG 24 hr tablet Take 50 mg by mouth daily. Take with or immediately following a meal.     RABEprazole (ACIPHEX) 20 MG tablet Take 20 mg by mouth 2 (two) times daily.     albuterol (PROAIR HFA) 108 (90 Base) MCG/ACT inhaler Inhale 2 puffs into the lungs every 6 (six) hours as needed for wheezing or shortness of breath. (Patient not taking: Reported on 07/14/2023) 6.7 g 1   aspirin EC 81 MG tablet Take 1 tablet (81 mg  total) by mouth daily. Swallow whole. 30 tablet 12   ezetimibe (ZETIA) 10 MG tablet Take 1 tablet (10 mg total) by mouth daily. 90 tablet 3   famotidine (PEPCID) 40 MG tablet Take 40 mg by mouth daily.     hydrOXYzine (ATARAX) 50 MG tablet Take 50 mg by mouth 3 (three) times daily as needed for anxiety.      insulin aspart (NOVOLOG) 100 UNIT/ML injection Inject 120 Units into the skin See admin instructions. Patient uses this with the Omnipod insulin pump  its vary according to blood sugar. Loads 120 units every 3 days. (Patient taking differently: Inject 150 Units into the skin See admin instructions. Patient uses this with the Omnipod insulin pump  its vary according to blood sugar. Loads 150 units every 3 days.)     Insulin Disposable Pump (OMNIPOD DASH PODS, GEN 4,) MISC Place 1 Applicatorful onto the skin See admin instructions. Every three days     isosorbide dinitrate (ISORDIL) 20 MG tablet Take 20 mg by mouth 3 (three) times daily.     Methoxy PEG-Epoetin Beta (MIRCERA IJ) as directed. [facility] clinic     metoCLOPramide (REGLAN) 5 MG tablet Take 5-10 mg by mouth every 8 (eight) hours as needed for vomiting or nausea.     nitroGLYCERIN (NITROSTAT) 0.4 MG SL tablet Place 1 tablet (0.4 mg total) under the tongue every 5 (five) minutes as needed for chest pain. 90 tablet 3   olmesartan (BENICAR) 20 MG tablet Take 20 mg by mouth daily.     polyethylene glycol (MIRALAX / GLYCOLAX) 17 g packet Take 17 g by mouth daily as needed for moderate constipation.     pravastatin (PRAVACHOL) 40 MG tablet Take 1 tablet (40 mg total) by mouth every evening. 90 tablet 3   pregabalin (LYRICA) 50 MG capsule Take 50 mg by mouth daily as needed (neuropathy).     prochlorperazine (COMPAZINE) 5 MG tablet Take 1 tablet (5 mg total) by mouth every 6 (six) hours as needed for nausea or vomiting. 15 tablet 0   scopolamine (TRANSDERM-SCOP) 1 MG/3DAYS Place 1 patch (1.5 mg total) onto the skin every 3 (three) days. 24 patch 4   tobramycin (TOBREX) 0.3 % ophthalmic solution Place 1 drop into both eyes See admin instructions. Instill 1 drop into both eyes once every 8 weeks before eye injections     torsemide (DEMADEX) 20 MG tablet Take 20 mg by mouth daily as needed (swelling).     TRESIBA FLEXTOUCH 100 UNIT/ML FlexTouch Pen  Inject into the skin as needed (when insulin pump is not working).     Vitamin D, Ergocalciferol, (DRISDOL) 1.25 MG (50000 UNIT) CAPS capsule Take 50,000 Units by mouth once a week.     VOQUEZNA 10 MG TABS Take 1 tablet by mouth daily.     Labs: Basic Metabolic Panel: Recent Labs  Lab 07/14/23 0645 07/14/23 0703  NA 137 137  K 3.4* 3.5  CL 99 105  CO2 19*  --   GLUCOSE 261* 272*  BUN 49* 50*  CREATININE 7.69* 8.60*  CALCIUM 9.3  --   PHOS 5.5*  --    Liver Function Tests: Recent Labs  Lab 07/14/23 0645  AST 13*  ALT 8  ALKPHOS 68  BILITOT 1.7*  PROT 8.0  ALBUMIN 4.0   Recent Labs  Lab 07/14/23 0645  LIPASE 23   No results for input(s): "AMMONIA" in the last 168 hours. CBC: Recent Labs  Lab 07/14/23 0645 07/14/23  0703  WBC 8.4  --   HGB 11.2* 11.9*  HCT 34.5* 35.0*  MCV 91.0  --   PLT 241  --    Cardiac Enzymes: No results for input(s): "CKTOTAL", "CKMB", "CKMBINDEX", "TROPONINI" in the last 168 hours. CBG: Recent Labs  Lab 07/14/23 0707 07/14/23 1019 07/14/23 1226  GLUCAP 264* 273* 223*   Iron Studies: No results for input(s): "IRON", "TIBC", "TRANSFERRIN", "FERRITIN" in the last 72 hours. Studies/Results: CT ABDOMEN PELVIS WO CONTRAST  Result Date: 07/14/2023 CLINICAL DATA:  Acute generalized abdominal pain. EXAM: CT ABDOMEN AND PELVIS WITHOUT CONTRAST TECHNIQUE: Multidetector CT imaging of the abdomen and pelvis was performed following the standard protocol without IV contrast. RADIATION DOSE REDUCTION: This exam was performed according to the departmental dose-optimization program which includes automated exposure control, adjustment of the mA and/or kV according to patient size and/or use of iterative reconstruction technique. COMPARISON:  October 05, 2022. FINDINGS: Lower chest: No acute abnormality. Hepatobiliary: No focal liver abnormality is seen. No gallstones, gallbladder wall thickening, or biliary dilatation. Pancreas: Unremarkable. No pancreatic  ductal dilatation or surrounding inflammatory changes. Spleen: Normal in size without focal abnormality. Adrenals/Urinary Tract: Adrenal glands are unremarkable. Kidneys are normal, without renal calculi, focal lesion, or hydronephrosis. Bladder is unremarkable. Stomach/Bowel: Small sliding-type hiatal hernia. There is no evidence of bowel obstruction or inflammation. The appendix appears normal. Vascular/Lymphatic: No significant vascular findings are present. No enlarged abdominal or pelvic lymph nodes. Reproductive: Uterus and bilateral adnexa are unremarkable. Other: Small fat containing periumbilical hernia.  No ascites. Musculoskeletal: No acute or significant osseous findings. IMPRESSION: Small sliding-type hiatal hernia. Small fat containing periumbilical hernia. No other abnormality seen in the abdomen pelvis. Electronically Signed   By: Lupita Raider M.D.   On: 07/14/2023 08:37   DG Chest Portable 1 View  Result Date: 07/14/2023 CLINICAL DATA:  Upper abdominal pain EXAM: PORTABLE CHEST 1 VIEW COMPARISON:  04/30/2023 FINDINGS: Central line with tip at the upper right atrium. Single chamber ICD lead into the right ventricle. There is no edema, consolidation, effusion, or pneumothorax. Normal heart size and mediastinal contours. IMPRESSION: No evidence of active disease. Electronically Signed   By: Tiburcio Pea M.D.   On: 07/14/2023 07:07    Dialysis Orders: Center: [facility]  on MWF. 180Nre 4 hours BFR 400 DFR Auto 1.5 3K 2.5Ca EDW 74.9kg Heparin 3000 unit bolus and 2000 unit bolus mid run Mircera IV q 4 weeks  Venofer 50mg  IV weekly  Calcitriol 0.35mcg Po q HD  Assessment/Plan:  Refractory nausea: Likely acute exacerbation of diabetic gastroparesis. GI consulted, plan for IV protonix and EGD.  ESRD:  Missed dialysis due to #1. Not volume overloaded. K+ controlled, possibly some early uremic symptoms. Will attempt to get her on the schedule for HD tonight, may need to be  tomorrow AM depending on schedule  Hypertension/volume: Was unable to keep BP meds down. Improving with IV hydralazine. Euvolemic on exam, UF with HD as tolerated  Anemia: Hgb at goal, no ESA indicated at this time.  Metabolic bone disease: Calcium controlled. Continue VDRA once tolerating PO meds. Phos at goal, resume binders when eating  Nutrition:  Currently NPO Dialysis access: Reports she was scheduled for new AVG this week, will alert [facility] if patient remains admitted at that time.   Rogers Blocker, PA-C 07/14/2023, 3:10 PM  [employer] Pager: [phone]

## 2023-07-14 NOTE — ED Provider Notes (Signed)
[place] [facility] AT [facility] Provider Note   CSN: [id number] Arrival date & time: 07/14/23  9147     History  Chief Complaint  Patient presents with   Hematemesis    [name] is a 52 y.o. female with past medical history as needed for type 1 diabetes, previous MI, hyperlipidemia, CHF, anxiety, depression, ESRD on dialysis who presents with concern for 10 out of 10 abdominal pain, emesis over the last 48 to 72 hours.  She reports that she has not been able to keep her home blood pressure medicines or nausea medication down.  Reports her last dialysis was last Wednesday, and she only completed half a session.  She endorses some chest pain associated with her vomiting, and reports that she is vomited some dark blood mixed with some red blood.  She denies any diarrhea, constipation.  HPI     Home Medications Prior to Admission medications   Medication Sig Start Date End Date Taking? Authorizing Provider  acetaminophen (TYLENOL) 500 MG tablet Take 1,000 mg by mouth every 8 (eight) hours as needed for moderate pain or mild pain.   Yes [provider]  carvedilol (COREG) 3.125 MG tablet Take 1 tablet (3.125 mg total) by mouth 4 (four) times a week. Take twice daily on non Dialysis days (Tues,Thur,Sat, Sun) Patient taking differently: Take 3.125 mg by mouth in the morning and at bedtime. 06/17/23  Yes Chandrasekhar, Mahesh A, MD  cloNIDine (CATAPRES) 0.1 MG tablet Take 0.1 mg by mouth daily as needed (bp > 180/120). 05/08/23  Yes [provider]  hydrALAZINE (APRESOLINE) 50 MG tablet Take 1.5 tablets (75 mg total) by mouth 3 (three) times daily. Patient taking differently: Take 50 mg by mouth 3 (three) times daily. 12/10/22  Yes Chandrasekhar, Mahesh A, MD  LORazepam (ATIVAN) 0.5 MG tablet Take 1 tablet (0.5 mg total) by mouth every 8 (eight) hours as needed for anxiety (Refractory nausea and vomiting). Patient taking differently: Take 0.5 mg by  mouth every 6 (six) hours as needed for anxiety (Refractory nausea and vomiting). 07/17/22  Yes Zannie Cove, MD  metoprolol succinate (TOPROL-XL) 50 MG 24 hr tablet Take 50 mg by mouth daily. Take with or immediately following a meal.   Yes [provider]  RABEprazole (ACIPHEX) 20 MG tablet Take 20 mg by mouth 2 (two) times daily.   Yes [provider]  albuterol (PROAIR HFA) 108 (90 Base) MCG/ACT inhaler Inhale 2 puffs into the lungs every 6 (six) hours as needed for wheezing or shortness of breath. Patient not taking: Reported on 07/14/2023 07/03/20   Waldon Merl, PA-C  aspirin EC 81 MG tablet Take 1 tablet (81 mg total) by mouth daily. Swallow whole. 10/16/22   Hughie Closs, MD  ezetimibe (ZETIA) 10 MG tablet Take 1 tablet (10 mg total) by mouth daily. 12/09/22   Christell Constant, MD  famotidine (PEPCID) 40 MG tablet Take 40 mg by mouth daily.    [provider]  hydrOXYzine (ATARAX) 50 MG tablet Take 50 mg by mouth 3 (three) times daily as needed for anxiety.    [provider]  insulin aspart (NOVOLOG) 100 UNIT/ML injection Inject 120 Units into the skin See admin instructions. Patient uses this with the Omnipod insulin pump  its vary according to blood sugar. Loads 120 units every 3 days. Patient taking differently: Inject 150 Units into the skin See admin instructions. Patient uses this with the Omnipod insulin pump  its vary  according to blood sugar. Loads 150 units every 3 days. 05/02/23   Ghimire, Werner Lean, MD  Insulin Disposable Pump (OMNIPOD DASH PODS, GEN 4,) MISC Place 1 Applicatorful onto the skin See admin instructions. Every three days    [provider]  isosorbide dinitrate (ISORDIL) 20 MG tablet Take 20 mg by mouth 3 (three) times daily. 10/18/22   [provider]  Methoxy PEG-Epoetin Beta (MIRCERA IJ) as directed. [facility] 11/27/22 11/26/23  [provider]  metoCLOPramide (REGLAN) 5 MG tablet Take 5-10  mg by mouth every 8 (eight) hours as needed for vomiting or nausea.    [provider]  nitroGLYCERIN (NITROSTAT) 0.4 MG SL tablet Place 1 tablet (0.4 mg total) under the tongue every 5 (five) minutes as needed for chest pain. 09/05/22 06/17/23  Christell Constant, MD  olmesartan (BENICAR) 20 MG tablet Take 20 mg by mouth daily. 05/29/23   [provider]  polyethylene glycol (MIRALAX / GLYCOLAX) 17 g packet Take 17 g by mouth daily as needed for moderate constipation.    [provider]  pravastatin (PRAVACHOL) 40 MG tablet Take 1 tablet (40 mg total) by mouth every evening. 12/09/22   Chandrasekhar, Mahesh A, MD  pregabalin (LYRICA) 50 MG capsule Take 50 mg by mouth daily as needed (neuropathy). 12/05/21   [provider]  prochlorperazine (COMPAZINE) 5 MG tablet Take 1 tablet (5 mg total) by mouth every 6 (six) hours as needed for nausea or vomiting. 05/01/23   Ghimire, Werner Lean, MD  scopolamine (TRANSDERM-SCOP) 1 MG/3DAYS Place 1 patch (1.5 mg total) onto the skin every 3 (three) days. 05/26/22   Rai, Ripudeep K, MD  tobramycin (TOBREX) 0.3 % ophthalmic solution Place 1 drop into both eyes See admin instructions. Instill 1 drop into both eyes once every 8 weeks before eye injections 04/21/23   [provider]  torsemide (DEMADEX) 20 MG tablet Take 20 mg by mouth daily as needed (swelling).    [provider]  TRESIBA FLEXTOUCH 100 UNIT/ML FlexTouch Pen Inject into the skin as needed (when insulin pump is not working). 06/02/23   [provider]  Vitamin D, Ergocalciferol, (DRISDOL) 1.25 MG (50000 UNIT) CAPS capsule Take 50,000 Units by mouth once a week. 02/10/23   [provider]  VOQUEZNA 10 MG TABS Take 1 tablet by mouth daily.    [provider]      Allergies    Atorvastatin, Dulaglutide, Ivp dye [iodinated contrast media], Metoclopramide, Other, Sertraline, Vancomycin, Zofran [ondansetron], Amoxicillin, Lantus  [insulin glargine], and Miconazole nitrate    Review of Systems   Review of Systems  All other systems reviewed and are negative.   Physical Exam Updated Vital Signs BP (!) 196/106   Pulse (!) 117   Temp 98.5 F (36.9 C) (Oral)   Resp 17   LMP 11/17/2021 (Approximate)   SpO2 97%  Physical Exam Vitals and nursing note reviewed.  Constitutional:      General: She is not in acute distress.    Appearance: Normal appearance. She is ill-appearing.  HENT:     Head: Normocephalic and atraumatic.  Eyes:     General:        Right eye: No discharge.        Left eye: No discharge.  Cardiovascular:     Rate and Rhythm: Regular rhythm. Tachycardia present.     Heart sounds: No murmur heard.    No friction rub. No gallop.  Pulmonary:  Effort: Pulmonary effort is normal.     Breath sounds: Normal breath sounds.  Abdominal:     General: Bowel sounds are normal.     Palpations: Abdomen is soft.     Comments: Diffusely tender throughout the abdomen with no rebound, rigidity, guarding, most focally in the epigastric region  Skin:    General: Skin is warm and dry.     Capillary Refill: Capillary refill takes less than 2 seconds.  Neurological:     Mental Status: She is alert and oriented to person, place, and time.  Psychiatric:        Mood and Affect: Mood normal.        Behavior: Behavior normal.     ED Results / Procedures / Treatments   Labs (all labs ordered are listed, but only abnormal results are displayed) Labs Reviewed  COMPREHENSIVE METABOLIC PANEL - Abnormal; Notable for the following components:      Result Value   Potassium 3.4 (*)    CO2 19 (*)    Glucose, Bld 261 (*)    BUN 49 (*)    Creatinine, Ser 7.69 (*)    AST 13 (*)    Total Bilirubin 1.7 (*)    GFR, Estimated 6 (*)    Anion gap 19 (*)    All other components within normal limits  CBC - Abnormal; Notable for the following components:   RBC 3.79 (*)    Hemoglobin 11.2 (*)    HCT 34.5 (*)    All  other components within normal limits  URINALYSIS, ROUTINE W REFLEX MICROSCOPIC - Abnormal; Notable for the following components:   APPearance CLOUDY (*)    Glucose, UA >=500 (*)    Ketones, ur 20 (*)    Protein, ur >=300 (*)    Bacteria, UA RARE (*)    All other components within normal limits  PHOSPHORUS - Abnormal; Notable for the following components:   Phosphorus 5.5 (*)    All other components within normal limits  I-STAT CHEM 8, ED - Abnormal; Notable for the following components:   BUN 50 (*)    Creatinine, Ser 8.60 (*)    Glucose, Bld 272 (*)    Calcium, Ion 0.95 (*)    Hemoglobin 11.9 (*)    HCT 35.0 (*)    All other components within normal limits  CBG MONITORING, ED - Abnormal; Notable for the following components:   Glucose-Capillary 264 (*)    All other components within normal limits  CBG MONITORING, ED - Abnormal; Notable for the following components:   Glucose-Capillary 273 (*)    All other components within normal limits  TROPONIN I (HIGH SENSITIVITY) - Abnormal; Notable for the following components:   Troponin I (High Sensitivity) 78 (*)    All other components within normal limits  TROPONIN I (HIGH SENSITIVITY) - Abnormal; Notable for the following components:   Troponin I (High Sensitivity) 78 (*)    All other components within normal limits  LIPASE, BLOOD  MAGNESIUM  BETA-HYDROXYBUTYRIC ACID  I-STAT CG4 LACTIC ACID, ED  I-STAT CG4 LACTIC ACID, ED  TYPE AND SCREEN    EKG EKG Interpretation Date/Time:  Monday July 14 2023 06:24:33 EST Ventricular Rate:  117 PR Interval:  148 QRS Duration:  88 QT Interval:  321 QTC Calculation: 448 R Axis:   78  Text Interpretation: Sinus tachycardia Ventricular premature complex Probable left atrial enlargement Probable LVH with secondary repol abnrm No significant change since last tracing Confirmed by  Gilda Crease ([id number]) on 07/14/2023 6:28:25 AM  Radiology CT ABDOMEN PELVIS WO CONTRAST  Result  Date: 07/14/2023 CLINICAL DATA:  Acute generalized abdominal pain. EXAM: CT ABDOMEN AND PELVIS WITHOUT CONTRAST TECHNIQUE: Multidetector CT imaging of the abdomen and pelvis was performed following the standard protocol without IV contrast. RADIATION DOSE REDUCTION: This exam was performed according to the departmental dose-optimization program which includes automated exposure control, adjustment of the mA and/or kV according to patient size and/or use of iterative reconstruction technique. COMPARISON:  October 05, 2022. FINDINGS: Lower chest: No acute abnormality. Hepatobiliary: No focal liver abnormality is seen. No gallstones, gallbladder wall thickening, or biliary dilatation. Pancreas: Unremarkable. No pancreatic ductal dilatation or surrounding inflammatory changes. Spleen: Normal in size without focal abnormality. Adrenals/Urinary Tract: Adrenal glands are unremarkable. Kidneys are normal, without renal calculi, focal lesion, or hydronephrosis. Bladder is unremarkable. Stomach/Bowel: Small sliding-type hiatal hernia. There is no evidence of bowel obstruction or inflammation. The appendix appears normal. Vascular/Lymphatic: No significant vascular findings are present. No enlarged abdominal or pelvic lymph nodes. Reproductive: Uterus and bilateral adnexa are unremarkable. Other: Small fat containing periumbilical hernia.  No ascites. Musculoskeletal: No acute or significant osseous findings. IMPRESSION: Small sliding-type hiatal hernia. Small fat containing periumbilical hernia. No other abnormality seen in the abdomen pelvis. Electronically Signed   By: Lupita Raider M.D.   On: 07/14/2023 08:37   DG Chest Portable 1 View  Result Date: 07/14/2023 CLINICAL DATA:  Upper abdominal pain EXAM: PORTABLE CHEST 1 VIEW COMPARISON:  04/30/2023 FINDINGS: Central line with tip at the upper right atrium. Single chamber ICD lead into the right ventricle. There is no edema, consolidation, effusion, or pneumothorax.  Normal heart size and mediastinal contours. IMPRESSION: No evidence of active disease. Electronically Signed   By: Tiburcio Pea M.D.   On: 07/14/2023 07:07    Procedures Procedures    Medications Ordered in ED Medications  promethazine (PHENERGAN) 12.5 mg in sodium chloride 0.9 % 50 mL IVPB (has no administration in time range)  insulin aspart (novoLOG) injection 0-6 Units (3 Units Subcutaneous Given 07/14/23 1033)  HYDROmorphone (DILAUDID) injection 0.5 mg (0.5 mg Intravenous Given 07/14/23 0700)  pantoprazole (PROTONIX) injection 40 mg (40 mg Intravenous Given 07/14/23 0703)  hydrALAZINE (APRESOLINE) injection 20 mg (20 mg Intravenous Given 07/14/23 0701)  sodium chloride 0.9 % bolus 500 mL (500 mLs Intravenous New Bag/Given 07/14/23 1007)  HYDROmorphone (DILAUDID) injection 1 mg (1 mg Intravenous Given 07/14/23 1008)  prochlorperazine (COMPAZINE) injection 10 mg (10 mg Intravenous Given 07/14/23 1048)  diphenhydrAMINE (BENADRYL) injection 25 mg (25 mg Intravenous Given 07/14/23 1046)    ED Course/ Medical Decision Making/ A&P                                 Medical Decision Making Amount and/or Complexity of Data Reviewed Labs: ordered. Radiology: ordered.  Risk Prescription drug management.   This patient is a 52 y.o. female who presents to the ED for concern of nausea, vomiting, abdominal pain, missed dialysis, this involves an extensive number of treatment options, and is a complaint that carries with it a high risk of complications and morbidity. The emergent differential diagnosis prior to evaluation includes, but is not limited to,  The causes of generalized abdominal pain include but are not limited to AAA, mesenteric ischemia, appendicitis, diverticulitis, DKA, gastritis, gastroenteritis, AMI, nephrolithiasis, pancreatitis, peritonitis, adrenal insufficiency,lead poisoning, iron toxicity, intestinal ischemia, constipation, UTI,SBO/LBO, splenic rupture,  biliary disease, IBD, IBS,  PUD, or hepatitis --given history additionally considered Mallory-Weiss tear, esophagitis,. This is not an exhaustive differential.   Past Medical History / Co-morbidities / Social History:  type 1 diabetes, previous MI, hyperlipidemia, CHF, anxiety, depression, ESRD on dialysis  Additional history: Chart reviewed. Pertinent results include: Reviewed lab work, imaging from previous emergency department visits, outpatient GI visits  Physical Exam: Physical exam performed. The pertinent findings include: Patient with abnormal vital signs on arrival, blood pressure 218/139, improved to 196/106 after 20 mg of hydralazine, she has been tachycardic persistently likely secondary to dehydration, initially mildly tachypneic, respirations 22 with improvement to 17 on reeval.  She is afebrile, with stable oxygen saturation on room air.  She has diffuse tenderness of the abdomen, most focally in the epigastric region.  She has been intermittently vomiting.  Lab Tests: I ordered, and personally interpreted labs.  The pertinent results include: UA with ketones, protein, squamous cells, no evidence of acute UTI.  Her CMP is notable for bicarb deficit, CO2 19, elevated glucose at 261, creatinine and BUN elevated in setting of ESRD, 7.69, 49 respectively, she has a mild elevation of total bilirubin, likely secondary to dehydration as well as an anion gap at 19 which is likely secondary to starvation ketoacidosis.  Her initial troponin is elevated at 78 but is flat on recheck, no evidence of acute ACS.  [place] secondary to demand ischemia in setting of ESRD, CBC notable for mild anemia, hemoglobin 11.2, stable to slightly improved from baseline.   Imaging Studies: I ordered imaging studies including CT abdomen pelvis without contrast, plain film chest x-ray which shows. I independently visualized and interpreted imaging which showed no evidence of acute intrathoracic or intra-abdominal process to explain patient's  symptoms. I agree with the radiologist interpretation.   Cardiac Monitoring:  The patient was maintained on a cardiac monitor.  My attending physician Dr. Blinda Leatherwood viewed and interpreted the cardiac monitored which showed an underlying rhythm of: sinus tachycardia. I agree with this interpretation.   Medications: I ordered medication including small fluid bolus for dehydration, Protonix for acute upper GI bleed with hematemesis, hydralazine for hypertension, Dilaudid for pain, Phenergan for nausea, Compazine for nausea .  Patient continuing to have significant abdominal pain, nausea, vomiting despite medication, I think that especially given multiple missed sessions of dialysis, abnormal vital signs including persistent tachycardia, hypertension and intractable nausea and vomiting that she would likely benefit from admission.  Consultations Obtained: I requested consultation with the hospitalist, spoke with Dr. Maisie Fus, and after discussion of her physical exam findings, lab work,,  and discussed lab and imaging findings as well as pertinent plan - they recommend: Hospital admission.   Disposition: After consideration of the diagnostic results and the patients response to treatment, I feel that patient would benefit from admission as discussed above .   I discussed this case with my attending physician Dr. Suezanne Jacquet who cosigned this note including patient's presenting symptoms, physical exam, and planned diagnostics and interventions. Attending physician stated agreement with plan or made changes to plan which were implemented.    Final Clinical Impression(s) / ED Diagnoses Final diagnoses:  None    Rx / DC Orders ED Discharge Orders     None         Olene Floss, PA-C 07/14/23 1101    Lonell Grandchild, MD 07/15/23 0710

## 2023-07-14 NOTE — Consult Note (Signed)
Referring Provider: Dr. Maisie Fus Primary Care Physician:  Delma Officer, [place] Primary Gastroenterologist:  Dr. Lorenso Quarry  Reason for Consultation:  Hematemesis  HPI: [name] is a 52 y.o. female with multiple medical problems as stated below seen for GI consult with epigastric abdominal pain and coffee grounds emesis. She missed dialysis last week and had a partial session last Wed due to repeated vomiting. Reportedly vomitus was dark red earlier prior to coming to hospital. She denies vomiting blood last week stating that she was dry heaving last week. Somnolent during my evaluation and unable to give much history. Denies black stools or hematochezia. History of erosive esophagitis on EGD in 2022 by Dr. Russella Dar.  Past Medical History:  Diagnosis Date   AICD (automatic cardioverter/defibrillator) present    [employer]   Anemia    Anxiety    Arthritis    Asthma    CHF (congestive heart failure) (HCC)    Chronic diastolic CHF (congestive heart failure) (HCC) 12/14/2019   Depression    Diabetic ulcer of left foot (HCC) 05/12/2013   ESRD on hemodialysis (HCC)    MWF at [facility]   Gastroparesis    Generalized abdominal pain    History of chicken pox    Loss of weight 12/02/2019   Migraines    Mixed hyperlipidemia 09/05/2022   Mood disorder (HCC)    anxiety   Myocardial infarction (HCC)    Prurigo nodularis    with diabetic dermopathy   Type 1 diabetes mellitus (HCC) 05/23/2022   Type 1 diabetes, uncontrolled, with neuropathy    [place]   Ulcers of both lower legs (HCC) 02/20/2014    Past Surgical History:  Procedure Laterality Date   AV FISTULA PLACEMENT Left 10/01/2022   Procedure: ARTERIOVENOUS (AV)FISTULA CREATION;  Surgeon: Larina Earthly, MD;  Location: [facility];  Service: Vascular;  Laterality: Left;   AV FISTULA PLACEMENT Right 11/19/2022   Procedure: INSERTION OF RIGHT ARM ARTERIOVENOUS (AV) GORE-TEX GRAFT;  Surgeon: Chuck Hint, MD;  Location: [facility] OR;  Service:  Vascular;  Laterality: Right;   BIOPSY  08/11/2020   Procedure: BIOPSY;  Surgeon: Meryl Dare, MD;  Location: [facility];  Service: Endoscopy;;   BIOPSY  04/02/2022   Procedure: BIOPSY;  Surgeon: Lynann Bologna, MD;  Location: [facility];  Service: Gastroenterology;;   ESOPHAGOGASTRODUODENOSCOPY (EGD) WITH PROPOFOL N/A 08/11/2020   Procedure: ESOPHAGOGASTRODUODENOSCOPY (EGD) WITH PROPOFOL;  Surgeon: Meryl Dare, MD;  Location: [facility];  Service: Endoscopy;  Laterality: N/A;   ESOPHAGOGASTRODUODENOSCOPY (EGD) WITH PROPOFOL N/A 04/02/2022   Procedure: ESOPHAGOGASTRODUODENOSCOPY (EGD) WITH PROPOFOL;  Surgeon: Lynann Bologna, MD;  Location: [facility];  Service: Gastroenterology;  Laterality: N/A;   ESOPHAGOGASTRODUODENOSCOPY (EGD) WITH PROPOFOL N/A 05/23/2022   Procedure: ESOPHAGOGASTRODUODENOSCOPY (EGD) WITH PROPOFOL;  Surgeon: Midge Minium, MD;  Location: [facility] ENDOSCOPY;  Service: Endoscopy;  Laterality: N/A;   ICD IMPLANT N/A 04/09/2022   Procedure: ICD IMPLANT;  Surgeon: [name], MD;  Location[facility] INVASIVE CV LAB;  Service: Cardiovascular;  Laterality: N/A;   IR FLUORO GUIDE CV LINE RIGHT  10/07/2022   IR US GUIDE VASC ACCESS RIGHT  10/07/2022   treadmill stress test  01/2013   WNL, low risk study    Prior to Admission medications   Medication Sig Start Date End Date Taking? Authorizing Provider  acetaminophen (TYLENOL) 500 MG tablet Take 1,000 mg by mouth every 8 (eight) hours as needed for moderate pain or mild pain.   Yes [provider]  carvedilol (  COREG) 3.125 MG tablet Take 1 tablet (3.125 mg total) by mouth 4 (four) times a week. Take twice daily on non Dialysis days (Tues,Thur,Sat, Sun) Patient taking differently: Take 3.125 mg by mouth in the morning and at bedtime. 06/17/23  Yes Chandrasekhar, Mahesh A, MD  cloNIDine (CATAPRES) 0.1 MG tablet Take 0.1 mg by mouth daily as needed (bp > 180/120). 05/08/23  Yes [provider]  hydrALAZINE  (APRESOLINE) 50 MG tablet Take 1.5 tablets (75 mg total) by mouth 3 (three) times daily. Patient taking differently: Take 50 mg by mouth 3 (three) times daily. 12/10/22  Yes Chandrasekhar, Mahesh A, MD  LORazepam (ATIVAN) 0.5 MG tablet Take 1 tablet (0.5 mg total) by mouth every 8 (eight) hours as needed for anxiety (Refractory nausea and vomiting). Patient taking differently: Take 0.5 mg by mouth every 6 (six) hours as needed for anxiety (Refractory nausea and vomiting). 07/17/22  Yes Zannie Cove, MD  metoprolol succinate (TOPROL-XL) 50 MG 24 hr tablet Take 50 mg by mouth daily. Take with or immediately following a meal.   Yes [provider]  RABEprazole (ACIPHEX) 20 MG tablet Take 20 mg by mouth 2 (two) times daily.   Yes [provider]  albuterol (PROAIR HFA) 108 (90 Base) MCG/ACT inhaler Inhale 2 puffs into the lungs every 6 (six) hours as needed for wheezing or shortness of breath. Patient not taking: Reported on 07/14/2023 07/03/20   Waldon Merl, PA-C  aspirin EC 81 MG tablet Take 1 tablet (81 mg total) by mouth daily. Swallow whole. 10/16/22   Hughie Closs, MD  ezetimibe (ZETIA) 10 MG tablet Take 1 tablet (10 mg total) by mouth daily. 12/09/22   Christell Constant, MD  famotidine (PEPCID) 40 MG tablet Take 40 mg by mouth daily.    [provider]  hydrOXYzine (ATARAX) 50 MG tablet Take 50 mg by mouth 3 (three) times daily as needed for anxiety.    [provider]  insulin aspart (NOVOLOG) 100 UNIT/ML injection Inject 120 Units into the skin See admin instructions. Patient uses this with the Omnipod insulin pump  its vary according to blood sugar. Loads 120 units every 3 days. Patient taking differently: Inject 150 Units into the skin See admin instructions. Patient uses this with the Omnipod insulin pump  its vary according to blood sugar. Loads 150 units every 3 days. 05/02/23   Ghimire, Werner Lean, MD  Insulin Disposable Pump (OMNIPOD DASH PODS,  GEN 4,) MISC Place 1 Applicatorful onto the skin See admin instructions. Every three days    [provider]  isosorbide dinitrate (ISORDIL) 20 MG tablet Take 20 mg by mouth 3 (three) times daily. 10/18/22   [provider]  Methoxy PEG-Epoetin Beta (MIRCERA IJ) as directed. [facility] clinic 11/27/22 11/26/23  [provider]  metoCLOPramide (REGLAN) 5 MG tablet Take 5-10 mg by mouth every 8 (eight) hours as needed for vomiting or nausea.    [provider]  nitroGLYCERIN (NITROSTAT) 0.4 MG SL tablet Place 1 tablet (0.4 mg total) under the tongue every 5 (five) minutes as needed for chest pain. 09/05/22 06/17/23  Christell Constant, MD  olmesartan (BENICAR) 20 MG tablet Take 20 mg by mouth daily. 05/29/23   [provider]  polyethylene glycol (MIRALAX / GLYCOLAX) 17 g packet Take 17 g by mouth daily as needed for moderate constipation.    [provider]  pravastatin (PRAVACHOL) 40 MG tablet Take 1 tablet (40 mg total) by mouth every evening.  12/09/22   Chandrasekhar, Mahesh A, MD  pregabalin (LYRICA) 50 MG capsule Take 50 mg by mouth daily as needed (neuropathy). 12/05/21   [provider]  prochlorperazine (COMPAZINE) 5 MG tablet Take 1 tablet (5 mg total) by mouth every 6 (six) hours as needed for nausea or vomiting. 05/01/23   Ghimire, Werner Lean, MD  scopolamine (TRANSDERM-SCOP) 1 MG/3DAYS Place 1 patch (1.5 mg total) onto the skin every 3 (three) days. 05/26/22   Rai, Ripudeep K, MD  tobramycin (TOBREX) 0.3 % ophthalmic solution Place 1 drop into both eyes See admin instructions. Instill 1 drop into both eyes once every 8 weeks before eye injections 04/21/23   [provider]  torsemide (DEMADEX) 20 MG tablet Take 20 mg by mouth daily as needed (swelling).    [provider]  TRESIBA FLEXTOUCH 100 UNIT/ML FlexTouch Pen Inject into the skin as needed (when insulin pump is not working). 06/02/23   [provider]   Vitamin D, Ergocalciferol, (DRISDOL) 1.25 MG (50000 UNIT) CAPS capsule Take 50,000 Units by mouth once a week. 02/10/23   [provider]  VOQUEZNA 10 MG TABS Take 1 tablet by mouth daily.    [provider]    Scheduled Meds:  insulin aspart  0-6 Units Subcutaneous Q4H   Continuous Infusions:  promethazine (PHENERGAN) injection (IM or IVPB)     PRN Meds:.promethazine (PHENERGAN) injection (IM or IVPB)  Allergies as of 07/14/2023 - Reviewed 07/14/2023  Allergen Reaction Noted   Atorvastatin Other (See Comments) 04/23/2021   Dulaglutide Nausea And Vomiting and Other (See Comments) 05/23/2020   Ivp dye [iodinated contrast media] Hives, Itching, and Nausea And Vomiting 06/17/2023   Metoclopramide Other (See Comments) 09/27/2022   Other Itching 10/09/2022   Sertraline Nausea And Vomiting 09/05/2022   Vancomycin Itching and Swelling 11/04/2022   Zofran [ondansetron] Nausea And Vomiting and Other (See Comments) 04/24/2022   Amoxicillin Itching and Rash 11/25/2017   Lantus [insulin glargine] Itching and Rash 11/25/2017   Miconazole nitrate Nausea And Vomiting and Rash 05/23/2020    Family History  Problem Relation Age of Onset   Diabetes Mother        type 2   Hypertension Mother    Thyroid disease Mother    Bipolar disorder Mother    Heart disease Mother    Calcium disorder Mother    Cancer Maternal Grandmother        Breast, stomach   Cancer Paternal Grandmother        stomach, lung (smoker)   Diabetes Paternal Grandmother    Diabetes Paternal Grandfather    Heart failure Sister    Diabetes Sister    Stroke Maternal Aunt    Cancer Maternal Uncle        prostate   CAD Maternal Aunt        stents   Cancer Maternal Aunt 48       ovarian    Social History   Socioeconomic History   Marital status: Divorced    Spouse name: Not on file   Number of children: 2   Years of education: Not on file   Highest education level: Some college, no degree   Occupational History   Occupation: Production designer, theatre/television/film  Tobacco Use   Smoking status: Never   Smokeless tobacco: Never  Vaping Use   Vaping status: Never Used  Substance and Sexual Activity   Alcohol use: Not Currently   Drug use: No   Sexual activity: Yes  Partners: Male    Birth control/protection: None  Other Topics Concern   Not on file  Social History Narrative   Caffeine use: daily   Occupation: cleans houses   Regular exercise: yes, active 5x/wk   Diet: good water, fruits/vegetables daily   Social Determinants of Health   Financial Resource Strain: Medium Risk (12/26/2022)   Overall Financial Resource Strain (CARDIA)    Difficulty of Paying Living Expenses: Somewhat hard  Food Insecurity: No Food Insecurity (12/26/2022)   Hunger Vital Sign    Worried About Running Out of Food in the Last Year: Never true    Ran Out of Food in the Last Year: Never true  Transportation Needs: No Transportation Needs (12/26/2022)   PRAPARE - Administrator, Civil Service (Medical): No    Lack of Transportation (Non-Medical): No  Physical Activity: Inactive (12/26/2022)   Exercise Vital Sign    Days of Exercise per Week: 0 days    Minutes of Exercise per Session: 0 min  Stress: Stress Concern Present (01/07/2023)   [employer] of Occupational Health - Occupational Stress Questionnaire    Feeling of Stress : To some extent  Social Connections: Socially Isolated (12/26/2022)   Social Connection and Isolation Panel [NHANES]    Frequency of Communication with Friends and Family: Twice a week    Frequency of Social Gatherings with Friends and Family: More than three times a week    Attends Religious Services: Never    Database administrator or Organizations: No    Attends Banker Meetings: Never    Marital Status: Divorced  Catering manager Violence: Not At Risk (12/26/2022)   Humiliation, Afraid, Rape, and Kick questionnaire    Fear of Current or Ex-Partner: No     Emotionally Abused: No    Physically Abused: No    Sexually Abused: No    Review of Systems: All negative except as stated above in HPI.  Physical Exam: Vital signs: Vitals:   07/14/23 1000 07/14/23 1009  BP: (!) 196/106   Pulse: (!) 116 (!) 117  Resp: (!) 23 17  Temp:  98.5 F (36.9 C)  SpO2: 96% 97%     General:   Lethargic, thin, mild acute distress Head: normocephalic, atraumatic Eyes: anicteric sclera ENT: oropharynx clear Neck: supple, nontender Lungs:  Clear throughout to auscultation.   No wheezes, crackles, or rhonchi. No acute distress. Heart:  Regular rate and rhythm; no murmurs, clicks, rubs,  or gallops. Abdomen: epigastric tenderness with guarding, soft, nondistended, +BS  Rectal:  Deferred Ext: no edema  GI:  Lab Results: Recent Labs    07/14/23 0645 07/14/23 0703  WBC 8.4  --   HGB 11.2* 11.9*  HCT 34.5* 35.0*  PLT 241  --    BMET Recent Labs    07/14/23 0645 07/14/23 0703  NA 137 137  K 3.4* 3.5  CL 99 105  CO2 19*  --   GLUCOSE 261* 272*  BUN 49* 50*  CREATININE 7.69* 8.60*  CALCIUM 9.3  --    LFT Recent Labs    07/14/23 0645  PROT 8.0  ALBUMIN 4.0  AST 13*  ALT 8  ALKPHOS 68  BILITOT 1.7*   PT/INR No results for input(s): "LABPROT", "INR" in the last 72 hours.   Studies/Results: CT ABDOMEN PELVIS WO CONTRAST  Result Date: 07/14/2023 CLINICAL DATA:  Acute generalized abdominal pain. EXAM: CT ABDOMEN AND PELVIS WITHOUT CONTRAST TECHNIQUE: Multidetector CT imaging of the abdomen and  pelvis was performed following the standard protocol without IV contrast. RADIATION DOSE REDUCTION: This exam was performed according to the departmental dose-optimization program which includes automated exposure control, adjustment of the mA and/or kV according to patient size and/or use of iterative reconstruction technique. COMPARISON:  October 05, 2022. FINDINGS: Lower chest: No acute abnormality. Hepatobiliary: No focal liver abnormality is seen.  No gallstones, gallbladder wall thickening, or biliary dilatation. Pancreas: Unremarkable. No pancreatic ductal dilatation or surrounding inflammatory changes. Spleen: Normal in size without focal abnormality. Adrenals/Urinary Tract: Adrenal glands are unremarkable. Kidneys are normal, without renal calculi, focal lesion, or hydronephrosis. Bladder is unremarkable. Stomach/Bowel: Small sliding-type hiatal hernia. There is no evidence of bowel obstruction or inflammation. The appendix appears normal. Vascular/Lymphatic: No significant vascular findings are present. No enlarged abdominal or pelvic lymph nodes. Reproductive: Uterus and bilateral adnexa are unremarkable. Other: Small fat containing periumbilical hernia.  No ascites. Musculoskeletal: No acute or significant osseous findings. IMPRESSION: Small sliding-type hiatal hernia. Small fat containing periumbilical hernia. No other abnormality seen in the abdomen pelvis. Electronically Signed   By: Lupita Raider M.D.   On: 07/14/2023 08:37   DG Chest Portable 1 View  Result Date: 07/14/2023 CLINICAL DATA:  Upper abdominal pain EXAM: PORTABLE CHEST 1 VIEW COMPARISON:  04/30/2023 FINDINGS: Central line with tip at the upper right atrium. Single chamber ICD lead into the right ventricle. There is no edema, consolidation, effusion, or pneumothorax. Normal heart size and mediastinal contours. IMPRESSION: No evidence of active disease. Electronically Signed   By: Tiburcio Pea M.D.   On: 07/14/2023 07:07    Impression/Plan: Coffee grounds emesis likely due to erosive esophagitis but peptic ulcer disease also possible. Doubt malignancy. Start Protonix drip. EGD tomorrow to further evaluate. High dose Protonix IV boluses. NPO. Supportive care.    LOS: 0 days   [name]  07/14/2023, 12:50 PM  Questions please call [phone]

## 2023-07-14 NOTE — ED Triage Notes (Signed)
Brought in via EMS for N/V. Dialysis pt has missed 1 because of not feeling well. The abdominal pain 10/10, also has dark colored emesis. She is hypertensive. No Zofran given as she is allergic. She has not been able to keep anything down. She had tried a rectal antiemetic at home and that did not work.  Hypertensive.

## 2023-07-14 NOTE — H&P (Signed)
History and Physical    [name] ZOX:[id number] DOB: 02-06-1971 DOA: 07/14/2023  PCP: Delma Officer, PA  Patient coming from: home  I have personally briefly reviewed patient's old medical records in [facility]  Chief Complaint: refractory n/v with coffee grounds  HPI: [name] is a 52 y.o. 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 Patient presents to ED with refractory n/v/ x 2-3 days with associated epigastric abdominal pain as well as episode of blood in emesis. Per patient she began having symptoms of  nausea x 1 week but noted worsening symptoms with emesis over the last 2-3 days. She states due to persistent symptoms and episode of coffee ground emesis she presented to the ED.  ED Course:  Vitals:99,  bp 218/139, hr 116  rr 22, sat 100% Wbc 8.4, hgb 11.2 ( 10.1) plt 241 Lipase 23  Na 137, K 3.49 repeat 3.5, cr 7.69 ( 4.58), ast 13,  CE78 Mag 2.2 Phos5.5 UA rare bacteria WJ19,14 Lactic 0.9 NWG:NFAOZ tachycardia , pvc Tx dilaudid, hydralazine 20 mg/phenegran/protonix Cxr:NAD Ct abd: Small sliding-type hiatal hernia.   Small fat containing periumbilical hernia.   No other abnormality seen in the abdomen pelvis.  Betahydraxybutyric 3.3 Review of Systems: As per HPI otherwise 10 point review of systems negative.   Past Medical History:  Diagnosis Date   AICD (automatic cardioverter/defibrillator) present    [employer]   Anemia    Anxiety    Arthritis    Asthma    CHF (congestive heart failure) (HCC)    Chronic diastolic CHF (congestive heart failure) (HCC) 12/14/2019   Depression    Diabetic ulcer of left foot (HCC) 05/12/2013   ESRD on hemodialysis (HCC)    MWF at [facility]   Gastroparesis    Generalized abdominal pain    History of chicken pox    Loss of weight 12/02/2019    Migraines    Mixed hyperlipidemia 09/05/2022   Mood disorder (HCC)    anxiety   Myocardial infarction (HCC)    Prurigo nodularis    with diabetic dermopathy   Type 1 diabetes mellitus (HCC) 05/23/2022   Type 1 diabetes, uncontrolled, with neuropathy    [place]   Ulcers of both lower legs (HCC) 02/20/2014    Past Surgical History:  Procedure Laterality Date   AV FISTULA PLACEMENT Left 10/01/2022   Procedure: ARTERIOVENOUS (AV)FISTULA CREATION;  Surgeon: Larina Earthly, MD;  Location: [facility]S;  Service: Vascular;  Laterality: Left;   AV FISTULA PLACEMENT Right 11/19/2022   Procedure: INSERTION OF RIGHT ARM ARTERIOVENOUS (AV) GORE-TEX GRAFT;  Surgeon: Chuck Hint, MD;  Location: [facility] OR;  Service: Vascular;  Laterality: Right;   BIOPSY  08/11/2020   Procedure: BIOPSY;  Surgeon: Meryl Dare, MD;  Location: [facility] ENDOSCOPY;  Service: Endoscopy;;   BIOPSY  04/02/2022   Procedure: BIOPSY;  Surgeon: Lynann Bologna, MD;  Location: [facility] ENDOSCOPY;  Service: Gastroenterology;;   ESOPHAGOGASTRODUODENOSCOPY (EGD) WITH PROPOFOL N/A 08/11/2020   Procedure: ESOPHAGOGASTRODUODENOSCOPY (EGD) WITH PROPOFOL;  Surgeon: Meryl Dare, MD;  Location: [facility] ENDOSCOPY;  Service: Endoscopy;  Laterality: N/A;   ESOPHAGOGASTRODUODENOSCOPY (EGD) WITH PROPOFOL N/A 04/02/2022   Procedure: ESOPHAGOGASTRODUODENOSCOPY (EGD) WITH PROPOFOL;  Surgeon: Lynann Bologna, MD;  Location: [facility] ENDOSCOPY;  Service: Gastroenterology;  Laterality: N/A;   ESOPHAGOGASTRODUODENOSCOPY (EGD) WITH PROPOFOL N/A 05/23/2022   Procedure: ESOPHAGOGASTRODUODENOSCOPY (EGD)  WITH PROPOFOL;  Surgeon: Midge Minium, MD;  Location: [facility] ENDOSCOPY;  Service: Endoscopy;  Laterality: N/A;   ICD IMPLANT N/A 04/09/2022   Procedure: ICD IMPLANT;  Surgeon: [name], MD;  Location: [facility] INVASIVE CV LAB;  Service: Cardiovascular;  Laterality: N/A;   IR FLUORO GUIDE CV LINE RIGHT  10/07/2022   IR US GUIDE VASC ACCESS RIGHT  10/07/2022   treadmill stress test   01/2013   WNL, low risk study     reports that she has never smoked. She has never used smokeless tobacco. She reports that she does not currently use alcohol. She reports that she does not use drugs.  Allergies  Allergen Reactions   Atorvastatin Other (See Comments)    Dizziness    Dulaglutide Nausea And Vomiting and Other (See Comments)    Caused pancreatitis    Ivp Dye [Iodinated Contrast Media] Hives, Itching and Nausea And Vomiting    tremors   Metoclopramide Other (See Comments)    Possible movement disorder or tardive dyskinesia while on reglan Long term   Other Itching    Peas and carrots   Sertraline Nausea And Vomiting   Vancomycin Itching and Swelling   Zofran [Ondansetron] Nausea And Vomiting and Other (See Comments)    Causes nausea vomiting to worsen / doesn't really work for patient.   Amoxicillin Itching and Rash   Lantus [Insulin Glargine] Itching and Rash    Has tolerated insulin glargine many times with no reported issues   Miconazole Nitrate Nausea And Vomiting and Rash    Family History  Problem Relation Age of Onset   Diabetes Mother        type 2   Hypertension Mother    Thyroid disease Mother    Bipolar disorder Mother    Heart disease Mother    Calcium disorder Mother    Cancer Maternal Grandmother        Breast, stomach   Cancer Paternal Grandmother        stomach, lung (smoker)   Diabetes Paternal Grandmother    Diabetes Paternal Grandfather    Heart failure Sister    Diabetes Sister    Stroke Maternal Aunt    Cancer Maternal Uncle        prostate   CAD Maternal Aunt        stents   Cancer Maternal Aunt 38       ovarian    Prior to Admission medications   Medication Sig Start Date End Date Taking? Authorizing Provider  acetaminophen (TYLENOL) 500 MG tablet Take 1,000 mg by mouth every 8 (eight) hours as needed for moderate pain or mild pain.   Yes [provider]  carvedilol (COREG) 3.125 MG tablet Take 1 tablet (3.125 mg  total) by mouth 4 (four) times a week. Take twice daily on non Dialysis days (Tues,Thur,Sat, Sun) Patient taking differently: Take 3.125 mg by mouth in the morning and at bedtime. 06/17/23  Yes Chandrasekhar, Mahesh A, MD  cloNIDine (CATAPRES) 0.1 MG tablet Take 0.1 mg by mouth daily as needed (bp > 180/120). 05/08/23  Yes [provider]  hydrALAZINE (APRESOLINE) 50 MG tablet Take 1.5 tablets (75 mg total) by mouth 3 (three) times daily. Patient taking differently: Take 50 mg by mouth 3 (three) times daily. 12/10/22  Yes Chandrasekhar, Mahesh A, MD  LORazepam (ATIVAN) 0.5 MG tablet Take 1 tablet (0.5 mg total) by mouth every 8 (eight) hours as needed for anxiety (Refractory nausea and vomiting). Patient taking  differently: Take 0.5 mg by mouth every 6 (six) hours as needed for anxiety (Refractory nausea and vomiting). 07/17/22  Yes Zannie Cove, MD  metoprolol succinate (TOPROL-XL) 50 MG 24 hr tablet Take 50 mg by mouth daily. Take with or immediately following a meal.   Yes [provider]  RABEprazole (ACIPHEX) 20 MG tablet Take 20 mg by mouth 2 (two) times daily.   Yes [provider]  albuterol (PROAIR HFA) 108 (90 Base) MCG/ACT inhaler Inhale 2 puffs into the lungs every 6 (six) hours as needed for wheezing or shortness of breath. Patient not taking: Reported on 07/14/2023 07/03/20   Waldon Merl, PA-C  aspirin EC 81 MG tablet Take 1 tablet (81 mg total) by mouth daily. Swallow whole. 10/16/22   Hughie Closs, MD  ezetimibe (ZETIA) 10 MG tablet Take 1 tablet (10 mg total) by mouth daily. 12/09/22   Christell Constant, MD  famotidine (PEPCID) 40 MG tablet Take 40 mg by mouth daily.    [provider]  hydrOXYzine (ATARAX) 50 MG tablet Take 50 mg by mouth 3 (three) times daily as needed for anxiety.    [provider]  insulin aspart (NOVOLOG) 100 UNIT/ML injection Inject 120 Units into the skin See admin instructions. Patient uses this with the  Omnipod insulin pump  its vary according to blood sugar. Loads 120 units every 3 days. Patient taking differently: Inject 150 Units into the skin See admin instructions. Patient uses this with the Omnipod insulin pump  its vary according to blood sugar. Loads 150 units every 3 days. 05/02/23   Ghimire, Werner Lean, MD  Insulin Disposable Pump (OMNIPOD DASH PODS, GEN 4,) MISC Place 1 Applicatorful onto the skin See admin instructions. Every three days    [provider]  isosorbide dinitrate (ISORDIL) 20 MG tablet Take 20 mg by mouth 3 (three) times daily. 10/18/22   [provider]  Methoxy PEG-Epoetin Beta (MIRCERA IJ) as directed. [facility] clinic 11/27/22 11/26/23  [provider]  metoCLOPramide (REGLAN) 5 MG tablet Take 5-10 mg by mouth every 8 (eight) hours as needed for vomiting or nausea.    [provider]  nitroGLYCERIN (NITROSTAT) 0.4 MG SL tablet Place 1 tablet (0.4 mg total) under the tongue every 5 (five) minutes as needed for chest pain. 09/05/22 06/17/23  Christell Constant, MD  olmesartan (BENICAR) 20 MG tablet Take 20 mg by mouth daily. 05/29/23   [provider]  polyethylene glycol (MIRALAX / GLYCOLAX) 17 g packet Take 17 g by mouth daily as needed for moderate constipation.    [provider]  pravastatin (PRAVACHOL) 40 MG tablet Take 1 tablet (40 mg total) by mouth every evening. 12/09/22   Chandrasekhar, Mahesh A, MD  pregabalin (LYRICA) 50 MG capsule Take 50 mg by mouth daily as needed (neuropathy). 12/05/21   [provider]  prochlorperazine (COMPAZINE) 5 MG tablet Take 1 tablet (5 mg total) by mouth every 6 (six) hours as needed for nausea or vomiting. 05/01/23   Ghimire, Werner Lean, MD  scopolamine (TRANSDERM-SCOP) 1 MG/3DAYS Place 1 patch (1.5 mg total) onto the skin every 3 (three) days. 05/26/22   Rai, Ripudeep K, MD  tobramycin (TOBREX) 0.3 % ophthalmic solution Place 1 drop into both eyes See admin instructions. Instill  1 drop into both eyes once every 8 weeks before eye injections 04/21/23   [provider]  torsemide (DEMADEX) 20 MG tablet Take 20 mg by mouth daily as needed (swelling).  [provider]  TRESIBA FLEXTOUCH 100 UNIT/ML FlexTouch Pen Inject into the skin as needed (when insulin pump is not working). 06/02/23   [provider]  Vitamin D, Ergocalciferol, (DRISDOL) 1.25 MG (50000 UNIT) CAPS capsule Take 50,000 Units by mouth once a week. 02/10/23   [provider]  VOQUEZNA 10 MG TABS Take 1 tablet by mouth daily.    [provider]    Physical Exam: Vitals:   07/14/23 0800 07/14/23 0852 07/14/23 1000 07/14/23 1009  BP: (!) 191/106 (!) 196/108 (!) 196/106   Pulse: (!) 110 (!) 114 (!) 116 (!) 117  Resp: (!) 21 (!) 23 (!) 23 17  Temp:    98.5 F (36.9 C)  TempSrc:    Oral  SpO2: 96% 96% 96% 97%    Constitutional: NAD, calm, comfortable Vitals:   07/14/23 0800 07/14/23 0852 07/14/23 1000 07/14/23 1009  BP: (!) 191/106 (!) 196/108 (!) 196/106   Pulse: (!) 110 (!) 114 (!) 116 (!) 117  Resp: (!) 21 (!) 23 (!) 23 17  Temp:    98.5 F (36.9 C)  TempSrc:    Oral  SpO2: 96% 96% 96% 97%   Eyes: PERRL, lids and conjunctivae normal ENMT: Mucous membranes are dry.Normal dentition.  Neck: normal, supple, no masses, no thyromegaly Respiratory: clear to auscultation bilaterally, no wheezing, no crackles. Normal respiratory effort. No accessory muscle use.  Cardiovascular: Regular rhythm,tachycardic/ no murmurs / rubs / gallops. No extremity edema. 2+ pedal pulses. Abdomen: + epigastric tenderness, no masses palpated. No hepatosplenomegaly. Bowel sounds positive.  Musculoskeletal: no clubbing / cyanosis. No joint deformity upper and lower extremities. Good ROM, no contractures. Normal muscle tone.  Skin: no rashes, lesions, ulcers. No induration Neurologic: CN 2-12 grossly intact. Sensation intact,  Strength 5/5 in all 4.  Psychiatric: Normal judgment  and insight. Alert and oriented x 3. Normal mood.    Labs on Admission: I have personally reviewed following labs and imaging studies  CBC: Recent Labs  Lab 07/14/23 0645 07/14/23 0703  WBC 8.4  --   HGB 11.2* 11.9*  HCT 34.5* 35.0*  MCV 91.0  --   PLT 241  --    Basic Metabolic Panel: Recent Labs  Lab 07/14/23 0645 07/14/23 0703  NA 137 137  K 3.4* 3.5  CL 99 105  CO2 19*  --   GLUCOSE 261* 272*  BUN 49* 50*  CREATININE 7.69* 8.60*  CALCIUM 9.3  --   MG 2.2  --   PHOS 5.5*  --    GFR: CrCl cannot be calculated (Unknown ideal weight.). Liver Function Tests: Recent Labs  Lab 07/14/23 0645  AST 13*  ALT 8  ALKPHOS 68  BILITOT 1.7*  PROT 8.0  ALBUMIN 4.0   Recent Labs  Lab 07/14/23 0645  LIPASE 23   No results for input(s): "AMMONIA" in the last 168 hours. Coagulation Profile: No results for input(s): "INR", "PROTIME" in the last 168 hours. Cardiac Enzymes: No results for input(s): "CKTOTAL", "CKMB", "CKMBINDEX", "TROPONINI" in the last 168 hours. BNP (last 3 results) No results for input(s): "PROBNP" in the last 8760 hours. HbA1C: No results for input(s): "HGBA1C" in the last 72 hours. CBG: Recent Labs  Lab 07/14/23 0707 07/14/23 1019 07/14/23 1226  GLUCAP 264* 273* 223*   Lipid Profile: No results for input(s): "CHOL", "HDL", "LDLCALC", "TRIG", "CHOLHDL", "LDLDIRECT" in the last 72 hours. Thyroid Function Tests: No results for input(s): "TSH", "T4TOTAL", "FREET4", "T3FREE", "THYROIDAB" in the last 72 hours.  Anemia Panel: No results for input(s): "VITAMINB12", "FOLATE", "FERRITIN", "TIBC", "IRON", "RETICCTPCT" in the last 72 hours. Urine analysis:    Component Value Date/Time   COLORURINE YELLOW 07/14/2023 0721   APPEARANCEUR CLOUDY (A) 07/14/2023 0721   LABSPEC 1.012 07/14/2023 0721   PHURINE 8.0 07/14/2023 0721   GLUCOSEU >=500 (A) 07/14/2023 0721   HGBUR NEGATIVE 07/14/2023 0721   BILIRUBINUR NEGATIVE 07/14/2023 0721   BILIRUBINUR  negative 08/30/2020 1631   KETONESUR 20 (A) 07/14/2023 0721   PROTEINUR >=300 (A) 07/14/2023 0721   UROBILINOGEN 0.2 08/30/2020 1631   UROBILINOGEN 1.0 08/11/2008 1745   NITRITE NEGATIVE 07/14/2023 0721   LEUKOCYTESUR NEGATIVE 07/14/2023 0721    Radiological Exams on Admission: CT ABDOMEN PELVIS WO CONTRAST  Result Date: 07/14/2023 CLINICAL DATA:  Acute generalized abdominal pain. EXAM: CT ABDOMEN AND PELVIS WITHOUT CONTRAST TECHNIQUE: Multidetector CT imaging of the abdomen and pelvis was performed following the standard protocol without IV contrast. RADIATION DOSE REDUCTION: This exam was performed according to the departmental dose-optimization program which includes automated exposure control, adjustment of the mA and/or kV according to patient size and/or use of iterative reconstruction technique. COMPARISON:  October 05, 2022. FINDINGS: Lower chest: No acute abnormality. Hepatobiliary: No focal liver abnormality is seen. No gallstones, gallbladder wall thickening, or biliary dilatation. Pancreas: Unremarkable. No pancreatic ductal dilatation or surrounding inflammatory changes. Spleen: Normal in size without focal abnormality. Adrenals/Urinary Tract: Adrenal glands are unremarkable. Kidneys are normal, without renal calculi, focal lesion, or hydronephrosis. Bladder is unremarkable. Stomach/Bowel: Small sliding-type hiatal hernia. There is no evidence of bowel obstruction or inflammation. The appendix appears normal. Vascular/Lymphatic: No significant vascular findings are present. No enlarged abdominal or pelvic lymph nodes. Reproductive: Uterus and bilateral adnexa are unremarkable. Other: Small fat containing periumbilical hernia.  No ascites. Musculoskeletal: No acute or significant osseous findings. IMPRESSION: Small sliding-type hiatal hernia. Small fat containing periumbilical hernia. No other abnormality seen in the abdomen pelvis. Electronically Signed   By: Lupita Raider M.D.   On:  07/14/2023 08:37   DG Chest Portable 1 View  Result Date: 07/14/2023 CLINICAL DATA:  Upper abdominal pain EXAM: PORTABLE CHEST 1 VIEW COMPARISON:  04/30/2023 FINDINGS: Central line with tip at the upper right atrium. Single chamber ICD lead into the right ventricle. There is no edema, consolidation, effusion, or pneumothorax. Normal heart size and mediastinal contours. IMPRESSION: No evidence of active disease. Electronically Signed   By: Tiburcio Pea M.D.   On: 07/14/2023 07:07    EKG: Independently reviewed. See above  Assessment/Plan  Acute exacerbation of diabetic gastroparesis  -associated episode of  Hematemesis  prior hx of gastritis and esophagitis -NPO -Continue IV protonix BiD -Serial H/H -supportive care with antiemetic  - plan for edg in am  -appreciate GI recs    Type 1 DMII - iss/fs q4h  -patient off insulin pump  -lantus 15 units daily while off pump -resume pump as able  -diabetic RN consulted recs on chart  ESRD MWF -missed two sessions  - renal consult for HD    Hypertension, uncontrolled -continue metoprolol  -prn hydralazine   Chronic combined systolic and diastolic heart failure (HCC -Stable no acute exacerbation  -resume metoprolol -hold diuretic for now   Mixed hyperlipidemia -resume home regimen as able     DVT prophylaxis: scd Code Status: full/ as discussed per patient wishes in event of cardiac arrest  Family Communication: none at bedside Disposition Plan: patient  expected to be admitted greater than 2 midnights  Consults  called: GI Admission status: progressive care   Lurline Del MD [facility]   If 7PM-7AM, please contact night-coverage [email or web address] Password [email or web address]  07/14/2023, 2:40 PM

## 2023-07-14 NOTE — H&P (View-Only) (Signed)
Referring Provider: Dr. Maisie Fus Primary Care Physician:  Delma Officer, [place] Primary Gastroenterologist:  Dr. Lorenso Quarry  Reason for Consultation:  Hematemesis  HPI: [name] is a 52 y.o. female with multiple medical problems as stated below seen for GI consult with epigastric abdominal pain and coffee grounds emesis. She missed dialysis last week and had a partial session last Wed due to repeated vomiting. Reportedly vomitus was dark red earlier prior to coming to hospital. She denies vomiting blood last week stating that she was dry heaving last week. Somnolent during my evaluation and unable to give much history. Denies black stools or hematochezia. History of erosive esophagitis on EGD in 2022 by Dr. Russella Dar.  Past Medical History:  Diagnosis Date   AICD (automatic cardioverter/defibrillator) present    [employer]   Anemia    Anxiety    Arthritis    Asthma    CHF (congestive heart failure) (HCC)    Chronic diastolic CHF (congestive heart failure) (HCC) 12/14/2019   Depression    Diabetic ulcer of left foot (HCC) 05/12/2013   ESRD on hemodialysis (HCC)    MWF at [facility]   Gastroparesis    Generalized abdominal pain    History of chicken pox    Loss of weight 12/02/2019   Migraines    Mixed hyperlipidemia 09/05/2022   Mood disorder (HCC)    anxiety   Myocardial infarction (HCC)    Prurigo nodularis    with diabetic dermopathy   Type 1 diabetes mellitus (HCC) 05/23/2022   Type 1 diabetes, uncontrolled, with neuropathy    [place]   Ulcers of both lower legs (HCC) 02/20/2014    Past Surgical History:  Procedure Laterality Date   AV FISTULA PLACEMENT Left 10/01/2022   Procedure: ARTERIOVENOUS (AV)FISTULA CREATION;  Surgeon: Larina Earthly, MD;  Location: [facility];  Service: Vascular;  Laterality: Left;   AV FISTULA PLACEMENT Right 11/19/2022   Procedure: INSERTION OF RIGHT ARM ARTERIOVENOUS (AV) GORE-TEX GRAFT;  Surgeon: Chuck Hint, MD;  Location: [facility] OR;  Service:  Vascular;  Laterality: Right;   BIOPSY  08/11/2020   Procedure: BIOPSY;  Surgeon: Meryl Dare, MD;  Location: [facility]COPY;  Service: Endoscopy;;   BIOPSY  04/02/2022   Procedure: BIOPSY;  Surgeon: Lynann Bologna, MD;  Location: [facility] ENDOSCOPY;  Service: Gastroenterology;;   ESOPHAGOGASTRODUODENOSCOPY (EGD) WITH PROPOFOL N/A 08/11/2020   Procedure: ESOPHAGOGASTRODUODENOSCOPY (EGD) WITH PROPOFOL;  Surgeon: Meryl Dare, MD;  Location: [facility];  Service: Endoscopy;  Laterality: N/A;   ESOPHAGOGASTRODUODENOSCOPY (EGD) WITH PROPOFOL N/A 04/02/2022   Procedure: ESOPHAGOGASTRODUODENOSCOPY (EGD) WITH PROPOFOL;  Surgeon: Lynann Bologna, MD;  Location: [facility];  Service: Gastroenterology;  Laterality: N/A;   ESOPHAGOGASTRODUODENOSCOPY (EGD) WITH PROPOFOL N/A 05/23/2022   Procedure: ESOPHAGOGASTRODUODENOSCOPY (EGD) WITH PROPOFOL;  Surgeon: Midge Minium, MD;  Location: [facility] ENDOSCOPY;  Service: Endoscopy;  Laterality: N/A;   ICD IMPLANT N/A 04/09/2022   Procedure: ICD IMPLANT;  Surgeon: [name], MD;  Location: [facility] INVASIVE CV LAB;  Service: Cardiovascular;  Laterality: N/A;   IR FLUORO GUIDE CV LINE RIGHT  10/07/2022   IR US GUIDE VASC ACCESS RIGHT  10/07/2022   treadmill stress test  01/2013   WNL, low risk study    Prior to Admission medications   Medication Sig Start Date End Date Taking? Authorizing Provider  acetaminophen (TYLENOL) 500 MG tablet Take 1,000 mg by mouth every 8 (eight) hours as needed for moderate pain or mild pain.   Yes [provider]  carvedilol (  COREG) 3.125 MG tablet Take 1 tablet (3.125 mg total) by mouth 4 (four) times a week. Take twice daily on non Dialysis days (Tues,Thur,Sat, Sun) Patient taking differently: Take 3.125 mg by mouth in the morning and at bedtime. 06/17/23  Yes Chandrasekhar, Mahesh A, MD  cloNIDine (CATAPRES) 0.1 MG tablet Take 0.1 mg by mouth daily as needed (bp > 180/120). 05/08/23  Yes [provider]  hydrALAZINE  (APRESOLINE) 50 MG tablet Take 1.5 tablets (75 mg total) by mouth 3 (three) times daily. Patient taking differently: Take 50 mg by mouth 3 (three) times daily. 12/10/22  Yes Chandrasekhar, Mahesh A, MD  LORazepam (ATIVAN) 0.5 MG tablet Take 1 tablet (0.5 mg total) by mouth every 8 (eight) hours as needed for anxiety (Refractory nausea and vomiting). Patient taking differently: Take 0.5 mg by mouth every 6 (six) hours as needed for anxiety (Refractory nausea and vomiting). 07/17/22  Yes Zannie Cove, MD  metoprolol succinate (TOPROL-XL) 50 MG 24 hr tablet Take 50 mg by mouth daily. Take with or immediately following a meal.   Yes [provider]  RABEprazole (ACIPHEX) 20 MG tablet Take 20 mg by mouth 2 (two) times daily.   Yes [provider]  albuterol (PROAIR HFA) 108 (90 Base) MCG/ACT inhaler Inhale 2 puffs into the lungs every 6 (six) hours as needed for wheezing or shortness of breath. Patient not taking: Reported on 07/14/2023 07/03/20   Waldon Merl, PA-C  aspirin EC 81 MG tablet Take 1 tablet (81 mg total) by mouth daily. Swallow whole. 10/16/22   Hughie Closs, MD  ezetimibe (ZETIA) 10 MG tablet Take 1 tablet (10 mg total) by mouth daily. 12/09/22   Christell Constant, MD  famotidine (PEPCID) 40 MG tablet Take 40 mg by mouth daily.    [provider]  hydrOXYzine (ATARAX) 50 MG tablet Take 50 mg by mouth 3 (three) times daily as needed for anxiety.    [provider]  insulin aspart (NOVOLOG) 100 UNIT/ML injection Inject 120 Units into the skin See admin instructions. Patient uses this with the Omnipod insulin pump  its vary according to blood sugar. Loads 120 units every 3 days. Patient taking differently: Inject 150 Units into the skin See admin instructions. Patient uses this with the Omnipod insulin pump  its vary according to blood sugar. Loads 150 units every 3 days. 05/02/23   Ghimire, Werner Lean, MD  Insulin Disposable Pump (OMNIPOD DASH PODS,  GEN 4,) MISC Place 1 Applicatorful onto the skin See admin instructions. Every three days    [provider]  isosorbide dinitrate (ISORDIL) 20 MG tablet Take 20 mg by mouth 3 (three) times daily. 10/18/22   [provider]  Methoxy PEG-Epoetin Beta (MIRCERA IJ) as directed. [facility] 11/27/22 11/26/23  [provider]  metoCLOPramide (REGLAN) 5 MG tablet Take 5-10 mg by mouth every 8 (eight) hours as needed for vomiting or nausea.    [provider]  nitroGLYCERIN (NITROSTAT) 0.4 MG SL tablet Place 1 tablet (0.4 mg total) under the tongue every 5 (five) minutes as needed for chest pain. 09/05/22 06/17/23  Christell Constant, MD  olmesartan (BENICAR) 20 MG tablet Take 20 mg by mouth daily. 05/29/23   [provider]  polyethylene glycol (MIRALAX / GLYCOLAX) 17 g packet Take 17 g by mouth daily as needed for moderate constipation.    [provider]  pravastatin (PRAVACHOL) 40 MG tablet Take 1 tablet (40 mg total) by mouth every evening.  12/09/22   Chandrasekhar, Mahesh A, MD  pregabalin (LYRICA) 50 MG capsule Take 50 mg by mouth daily as needed (neuropathy). 12/05/21   [provider]  prochlorperazine (COMPAZINE) 5 MG tablet Take 1 tablet (5 mg total) by mouth every 6 (six) hours as needed for nausea or vomiting. 05/01/23   Ghimire, Werner Lean, MD  scopolamine (TRANSDERM-SCOP) 1 MG/3DAYS Place 1 patch (1.5 mg total) onto the skin every 3 (three) days. 05/26/22   Rai, Ripudeep K, MD  tobramycin (TOBREX) 0.3 % ophthalmic solution Place 1 drop into both eyes See admin instructions. Instill 1 drop into both eyes once every 8 weeks before eye injections 04/21/23   [provider]  torsemide (DEMADEX) 20 MG tablet Take 20 mg by mouth daily as needed (swelling).    [provider]  TRESIBA FLEXTOUCH 100 UNIT/ML FlexTouch Pen Inject into the skin as needed (when insulin pump is not working). 06/02/23   [provider]   Vitamin D, Ergocalciferol, (DRISDOL) 1.25 MG (50000 UNIT) CAPS capsule Take 50,000 Units by mouth once a week. 02/10/23   [provider]  VOQUEZNA 10 MG TABS Take 1 tablet by mouth daily.    [provider]    Scheduled Meds:  insulin aspart  0-6 Units Subcutaneous Q4H   Continuous Infusions:  promethazine (PHENERGAN) injection (IM or IVPB)     PRN Meds:.promethazine (PHENERGAN) injection (IM or IVPB)  Allergies as of 07/14/2023 - Reviewed 07/14/2023  Allergen Reaction Noted   Atorvastatin Other (See Comments) 04/23/2021   Dulaglutide Nausea And Vomiting and Other (See Comments) 05/23/2020   Ivp dye [iodinated contrast media] Hives, Itching, and Nausea And Vomiting 06/17/2023   Metoclopramide Other (See Comments) 09/27/2022   Other Itching 10/09/2022   Sertraline Nausea And Vomiting 09/05/2022   Vancomycin Itching and Swelling 11/04/2022   Zofran [ondansetron] Nausea And Vomiting and Other (See Comments) 04/24/2022   Amoxicillin Itching and Rash 11/25/2017   Lantus [insulin glargine] Itching and Rash 11/25/2017   Miconazole nitrate Nausea And Vomiting and Rash 05/23/2020    Family History  Problem Relation Age of Onset   Diabetes Mother        type 2   Hypertension Mother    Thyroid disease Mother    Bipolar disorder Mother    Heart disease Mother    Calcium disorder Mother    Cancer Maternal Grandmother        Breast, stomach   Cancer Paternal Grandmother        stomach, lung (smoker)   Diabetes Paternal Grandmother    Diabetes Paternal Grandfather    Heart failure Sister    Diabetes Sister    Stroke Maternal Aunt    Cancer Maternal Uncle        prostate   CAD Maternal Aunt        stents   Cancer Maternal Aunt 48       ovarian    Social History   Socioeconomic History   Marital status: Divorced    Spouse name: Not on file   Number of children: 2   Years of education: Not on file   Highest education level: Some college, no degree   Occupational History   Occupation: Production designer, theatre/television/film  Tobacco Use   Smoking status: Never   Smokeless tobacco: Never  Vaping Use   Vaping status: Never Used  Substance and Sexual Activity   Alcohol use: Not Currently   Drug use: No   Sexual activity: Yes  Partners: Male    Birth control/protection: None  Other Topics Concern   Not on file  Social History Narrative   Caffeine use: daily   Occupation: cleans houses   Regular exercise: yes, active 5x/wk   Diet: good water, fruits/vegetables daily   Social Determinants of Health   Financial Resource Strain: Medium Risk (12/26/2022)   Overall Financial Resource Strain (CARDIA)    Difficulty of Paying Living Expenses: Somewhat hard  Food Insecurity: No Food Insecurity (12/26/2022)   Hunger Vital Sign    Worried About Running Out of Food in the Last Year: Never true    Ran Out of Food in the Last Year: Never true  Transportation Needs: No Transportation Needs (12/26/2022)   PRAPARE - Administrator, Civil Service (Medical): No    Lack of Transportation (Non-Medical): No  Physical Activity: Inactive (12/26/2022)   Exercise Vital Sign    Days of Exercise per Week: 0 days    Minutes of Exercise per Session: 0 min  Stress: Stress Concern Present (01/07/2023)   [employer] of Occupational Health - Occupational Stress Questionnaire    Feeling of Stress : To some extent  Social Connections: Socially Isolated (12/26/2022)   Social Connection and Isolation Panel [NHANES]    Frequency of Communication with Friends and Family: Twice a week    Frequency of Social Gatherings with Friends and Family: More than three times a week    Attends Religious Services: Never    Database administrator or Organizations: No    Attends Banker Meetings: Never    Marital Status: Divorced  Catering manager Violence: Not At Risk (12/26/2022)   Humiliation, Afraid, Rape, and Kick questionnaire    Fear of Current or Ex-Partner: No     Emotionally Abused: No    Physically Abused: No    Sexually Abused: No    Review of Systems: All negative except as stated above in HPI.  Physical Exam: Vital signs: Vitals:   07/14/23 1000 07/14/23 1009  BP: (!) 196/106   Pulse: (!) 116 (!) 117  Resp: (!) 23 17  Temp:  98.5 F (36.9 C)  SpO2: 96% 97%     General:   Lethargic, thin, mild acute distress Head: normocephalic, atraumatic Eyes: anicteric sclera ENT: oropharynx clear Neck: supple, nontender Lungs:  Clear throughout to auscultation.   No wheezes, crackles, or rhonchi. No acute distress. Heart:  Regular rate and rhythm; no murmurs, clicks, rubs,  or gallops. Abdomen: epigastric tenderness with guarding, soft, nondistended, +BS  Rectal:  Deferred Ext: no edema  GI:  Lab Results: Recent Labs    07/14/23 0645 07/14/23 0703  WBC 8.4  --   HGB 11.2* 11.9*  HCT 34.5* 35.0*  PLT 241  --    BMET Recent Labs    07/14/23 0645 07/14/23 0703  NA 137 137  K 3.4* 3.5  CL 99 105  CO2 19*  --   GLUCOSE 261* 272*  BUN 49* 50*  CREATININE 7.69* 8.60*  CALCIUM 9.3  --    LFT Recent Labs    07/14/23 0645  PROT 8.0  ALBUMIN 4.0  AST 13*  ALT 8  ALKPHOS 68  BILITOT 1.7*   PT/INR No results for input(s): "LABPROT", "INR" in the last 72 hours.   Studies/Results: CT ABDOMEN PELVIS WO CONTRAST  Result Date: 07/14/2023 CLINICAL DATA:  Acute generalized abdominal pain. EXAM: CT ABDOMEN AND PELVIS WITHOUT CONTRAST TECHNIQUE: Multidetector CT imaging of the abdomen and  pelvis was performed following the standard protocol without IV contrast. RADIATION DOSE REDUCTION: This exam was performed according to the departmental dose-optimization program which includes automated exposure control, adjustment of the mA and/or kV according to patient size and/or use of iterative reconstruction technique. COMPARISON:  October 05, 2022. FINDINGS: Lower chest: No acute abnormality. Hepatobiliary: No focal liver abnormality is seen.  No gallstones, gallbladder wall thickening, or biliary dilatation. Pancreas: Unremarkable. No pancreatic ductal dilatation or surrounding inflammatory changes. Spleen: Normal in size without focal abnormality. Adrenals/Urinary Tract: Adrenal glands are unremarkable. Kidneys are normal, without renal calculi, focal lesion, or hydronephrosis. Bladder is unremarkable. Stomach/Bowel: Small sliding-type hiatal hernia. There is no evidence of bowel obstruction or inflammation. The appendix appears normal. Vascular/Lymphatic: No significant vascular findings are present. No enlarged abdominal or pelvic lymph nodes. Reproductive: Uterus and bilateral adnexa are unremarkable. Other: Small fat containing periumbilical hernia.  No ascites. Musculoskeletal: No acute or significant osseous findings. IMPRESSION: Small sliding-type hiatal hernia. Small fat containing periumbilical hernia. No other abnormality seen in the abdomen pelvis. Electronically Signed   By: Lupita Raider M.D.   On: 07/14/2023 08:37   DG Chest Portable 1 View  Result Date: 07/14/2023 CLINICAL DATA:  Upper abdominal pain EXAM: PORTABLE CHEST 1 VIEW COMPARISON:  04/30/2023 FINDINGS: Central line with tip at the upper right atrium. Single chamber ICD lead into the right ventricle. There is no edema, consolidation, effusion, or pneumothorax. Normal heart size and mediastinal contours. IMPRESSION: No evidence of active disease. Electronically Signed   By: Tiburcio Pea M.D.   On: 07/14/2023 07:07    Impression/Plan: Coffee grounds emesis likely due to erosive esophagitis but peptic ulcer disease also possible. Doubt malignancy. Start Protonix drip. EGD tomorrow to further evaluate. High dose Protonix IV boluses. NPO. Supportive care.    LOS: 0 days   [name]  07/14/2023, 12:50 PM  Questions please call [phone]

## 2023-07-15 ENCOUNTER — Encounter (HOSPITAL_COMMUNITY)
Admission: EM | Disposition: A | Discharge status: Home / Self Care | Payer: Self-pay | Source: Home / Self Care | Attending: Internal Medicine

## 2023-07-15 ENCOUNTER — Encounter (HOSPITAL_COMMUNITY): Payer: Self-pay | Admitting: Internal Medicine

## 2023-07-15 ENCOUNTER — Inpatient Hospital Stay (HOSPITAL_COMMUNITY): Payer: Self-pay | Admitting: Anesthesiology

## 2023-07-15 DIAGNOSIS — I10 Essential (primary) hypertension: Secondary | ICD-10-CM | POA: Diagnosis not present

## 2023-07-15 DIAGNOSIS — E1065 Type 1 diabetes mellitus with hyperglycemia: Secondary | ICD-10-CM | POA: Diagnosis not present

## 2023-07-15 DIAGNOSIS — R112 Nausea with vomiting, unspecified: Secondary | ICD-10-CM

## 2023-07-15 DIAGNOSIS — E876 Hypokalemia: Secondary | ICD-10-CM

## 2023-07-15 DIAGNOSIS — N186 End stage renal disease: Secondary | ICD-10-CM | POA: Diagnosis not present

## 2023-07-15 DIAGNOSIS — Z992 Dependence on renal dialysis: Secondary | ICD-10-CM

## 2023-07-15 HISTORY — PX: ESOPHAGOGASTRODUODENOSCOPY (EGD) WITH PROPOFOL: SHX5813

## 2023-07-15 LAB — CBC
HCT: 31.6 % — ABNORMAL LOW (ref 36.0–46.0)
Hemoglobin: 10.3 g/dL — ABNORMAL LOW (ref 12.0–15.0)
MCH: 29.5 pg (ref 26.0–34.0)
MCHC: 32.6 g/dL (ref 30.0–36.0)
MCV: 90.5 fL (ref 80.0–100.0)
Platelets: 200 10*3/uL (ref 150–400)
RBC: 3.49 MIL/uL — ABNORMAL LOW (ref 3.87–5.11)
RDW: 14.2 % (ref 11.5–15.5)
WBC: 9.8 10*3/uL (ref 4.0–10.5)
nRBC: 0 % (ref 0.0–0.2)

## 2023-07-15 LAB — COMPREHENSIVE METABOLIC PANEL
ALT: 9 U/L (ref 0–44)
AST: 17 U/L (ref 15–41)
Albumin: 3.7 g/dL (ref 3.5–5.0)
Alkaline Phosphatase: 62 U/L (ref 38–126)
Anion gap: 13 (ref 5–15)
BUN: 26 mg/dL — ABNORMAL HIGH (ref 6–20)
CO2: 25 mmol/L (ref 22–32)
Calcium: 9 mg/dL (ref 8.9–10.3)
Chloride: 97 mmol/L — ABNORMAL LOW (ref 98–111)
Creatinine, Ser: 5.5 mg/dL — ABNORMAL HIGH (ref 0.44–1.00)
GFR, Estimated: 9 mL/min — ABNORMAL LOW (ref >60)
Glucose, Bld: 161 mg/dL — ABNORMAL HIGH (ref 70–99)
Potassium: 3.4 mmol/L — ABNORMAL LOW (ref 3.5–5.1)
Sodium: 135 mmol/L (ref 135–145)
Total Bilirubin: 2 mg/dL — ABNORMAL HIGH (ref <1.2)
Total Protein: 7.3 g/dL (ref 6.5–8.1)

## 2023-07-15 LAB — POCT I-STAT, CHEM 8
BUN: 26 mg/dL — ABNORMAL HIGH (ref 6–20)
Calcium, Ion: 1.07 mmol/L — ABNORMAL LOW (ref 1.15–1.40)
Chloride: 98 mmol/L (ref 98–111)
Creatinine, Ser: 5.3 mg/dL — ABNORMAL HIGH (ref 0.44–1.00)
Glucose, Bld: 159 mg/dL — ABNORMAL HIGH (ref 70–99)
HCT: 34 % — ABNORMAL LOW (ref 36.0–46.0)
Hemoglobin: 11.6 g/dL — ABNORMAL LOW (ref 12.0–15.0)
Potassium: 3.6 mmol/L (ref 3.5–5.1)
Sodium: 137 mmol/L (ref 135–145)
TCO2: 26 mmol/L (ref 22–32)

## 2023-07-15 LAB — HEPATITIS B SURFACE ANTIBODY, QUANTITATIVE: Hep B S AB Quant (Post): 6129 m[IU]/mL

## 2023-07-15 LAB — GLUCOSE, CAPILLARY
Glucose-Capillary: 167 mg/dL — ABNORMAL HIGH (ref 70–99)
Glucose-Capillary: 167 mg/dL — ABNORMAL HIGH (ref 70–99)
Glucose-Capillary: 93 mg/dL (ref 70–99)

## 2023-07-15 LAB — CBG MONITORING, ED
Glucose-Capillary: 136 mg/dL — ABNORMAL HIGH (ref 70–99)
Glucose-Capillary: 154 mg/dL — ABNORMAL HIGH (ref 70–99)

## 2023-07-15 LAB — HEMOGLOBIN A1C
Hgb A1c MFr Bld: 7.9 % — ABNORMAL HIGH (ref 4.8–5.6)
Mean Plasma Glucose: 180.03 mg/dL

## 2023-07-15 LAB — HIV ANTIBODY (ROUTINE TESTING W REFLEX): HIV Screen 4th Generation wRfx: NONREACTIVE

## 2023-07-15 LAB — HEMOGLOBIN AND HEMATOCRIT, BLOOD
HCT: 29.9 % — ABNORMAL LOW (ref 36.0–46.0)
Hemoglobin: 9.9 g/dL — ABNORMAL LOW (ref 12.0–15.0)

## 2023-07-15 SURGERY — ESOPHAGOGASTRODUODENOSCOPY (EGD) WITH PROPOFOL
Anesthesia: General

## 2023-07-15 MED ORDER — INSULIN ASPART 100 UNIT/ML IJ SOLN
0.0000 [IU] | Freq: Every day | INTRAMUSCULAR | Status: DC
  Administered 2023-07-17: 5 [IU] via SUBCUTANEOUS

## 2023-07-15 MED ORDER — METOCLOPRAMIDE HCL 5 MG/ML IJ SOLN
10.0000 mg | Freq: Once | INTRAMUSCULAR | Status: AC
  Administered 2023-07-15: 10 mg via INTRAVENOUS
  Filled 2023-07-15: qty 2

## 2023-07-15 MED ORDER — LABETALOL HCL 5 MG/ML IV SOLN
5.0000 mg | INTRAVENOUS | Status: DC | PRN
  Administered 2023-07-15 (×2): 5 mg via INTRAVENOUS
  Filled 2023-07-15: qty 4

## 2023-07-15 MED ORDER — LABETALOL HCL 5 MG/ML IV SOLN
INTRAVENOUS | Status: AC
  Filled 2023-07-15: qty 4

## 2023-07-15 MED ORDER — HYDRALAZINE HCL 50 MG PO TABS
50.0000 mg | ORAL_TABLET | Freq: Three times a day (TID) | ORAL | Status: DC
  Administered 2023-07-15 – 2023-07-18 (×6): 50 mg via ORAL
  Filled 2023-07-15 (×6): qty 1

## 2023-07-15 MED ORDER — INSULIN GLARGINE-YFGN 100 UNIT/ML ~~LOC~~ SOLN
10.0000 [IU] | Freq: Every day | SUBCUTANEOUS | Status: DC
  Administered 2023-07-15 – 2023-07-17 (×3): 10 [IU] via SUBCUTANEOUS
  Filled 2023-07-15 (×4): qty 0.1

## 2023-07-15 MED ORDER — ISOSORBIDE DINITRATE 10 MG PO TABS
20.0000 mg | ORAL_TABLET | Freq: Three times a day (TID) | ORAL | Status: DC
  Administered 2023-07-15 – 2023-07-17 (×4): 20 mg via ORAL
  Filled 2023-07-15 (×7): qty 2

## 2023-07-15 MED ORDER — INSULIN ASPART 100 UNIT/ML IJ SOLN
0.0000 [IU] | Freq: Three times a day (TID) | INTRAMUSCULAR | Status: DC
  Administered 2023-07-15: 2 [IU] via SUBCUTANEOUS
  Administered 2023-07-16: 5 [IU] via SUBCUTANEOUS
  Administered 2023-07-17: 1 [IU] via SUBCUTANEOUS
  Administered 2023-07-18: 3 [IU] via SUBCUTANEOUS
  Filled 2023-07-15: qty 1

## 2023-07-15 MED ORDER — AMISULPRIDE (ANTIEMETIC) 5 MG/2ML IV SOLN
INTRAVENOUS | Status: AC
  Filled 2023-07-15: qty 2

## 2023-07-15 MED ORDER — SUCCINYLCHOLINE CHLORIDE 200 MG/10ML IV SOSY
PREFILLED_SYRINGE | INTRAVENOUS | Status: DC | PRN
  Administered 2023-07-15: 60 mg via INTRAVENOUS

## 2023-07-15 MED ORDER — SODIUM CHLORIDE 0.9 % IV SOLN: INTRAVENOUS | Status: DC

## 2023-07-15 MED ORDER — PROPOFOL 10 MG/ML IV BOLUS
INTRAVENOUS | Status: DC | PRN
  Administered 2023-07-15: 180 mg via INTRAVENOUS

## 2023-07-15 MED ORDER — CARVEDILOL 3.125 MG PO TABS
3.1250 mg | ORAL_TABLET | Freq: Two times a day (BID) | ORAL | Status: DC
  Administered 2023-07-15 – 2023-07-17 (×4): 3.125 mg via ORAL
  Filled 2023-07-15 (×5): qty 1

## 2023-07-15 MED ORDER — CHLORHEXIDINE GLUCONATE CLOTH 2 % EX PADS
6.0000 | MEDICATED_PAD | Freq: Every day | CUTANEOUS | Status: DC
  Administered 2023-07-16 – 2023-07-18 (×3): 6 via TOPICAL

## 2023-07-15 MED ORDER — HEPARIN SODIUM (PORCINE) 1000 UNIT/ML IJ SOLN
1000.0000 [IU] | INTRAMUSCULAR | Status: DC | PRN
  Administered 2023-07-15 – 2023-07-16 (×2): 3800 [IU]
  Administered 2023-07-18: 1000 [IU]
  Filled 2023-07-15 (×4): qty 1

## 2023-07-15 MED ORDER — INSULIN DEGLUDEC 100 UNIT/ML ~~LOC~~ SOPN: 10.0000 [IU] | PEN_INJECTOR | Freq: Every day | SUBCUTANEOUS | Status: DC

## 2023-07-15 SURGICAL SUPPLY — 14 items

## 2023-07-15 NOTE — Assessment & Plan Note (Signed)
07-15-2023 restart isordil, coreg, hydralazine.

## 2023-07-15 NOTE — Op Note (Signed)
[facility] Patient Name: [name] Procedure Date : 07/15/2023 MRN: [id number] Attending MD: [name] , MD, [id number] Date of Birth: September 27, 1970 CSN: [id number] Age: 52 Admit Type: Inpatient Procedure:                Upper GI endoscopy Indications:              Coffee-ground emesis Providers:                [name], MD, Suzy Bouchard, RN, Rozetta Nunnery, Technician Referring MD:             hospital team Medicines:                Propofol per Anesthesia, General Anesthesia Complications:            No immediate complications. Estimated Blood Loss:     Estimated blood loss was minimal. Procedure:                Pre-Anesthesia Assessment:                           - Prior to the procedure, a History and Physical                            was performed, and patient medications and                            allergies were reviewed. The patient's tolerance of                            previous anesthesia was also reviewed. The risks                            and benefits of the procedure and the sedation                            options and risks were discussed with the patient.                            All questions were answered, and informed consent                            was obtained. Prior Anticoagulants: The patient has                            taken no anticoagulant or antiplatelet agents. ASA                            Grade Assessment: IV - A patient with severe                            systemic disease that is a constant threat to life.  After reviewing the risks and benefits, the patient                            was deemed in satisfactory condition to undergo the                            procedure.                           After obtaining informed consent, the endoscope was                            passed under direct vision. Throughout the                             procedure, the patient's blood pressure, pulse, and                            oxygen saturations were monitored continuously. The                            [id number]-[id number] ([id number]) Olympus endoscope was introduced                            through the mouth, and advanced to the second part                            of duodenum. The upper GI endoscopy was                            accomplished without difficulty. The patient                            tolerated the procedure well. Scope In: Scope Out: Findings:      LA Grade C (one or more mucosal breaks continuous between tops of 2 or       more mucosal folds, less than 75% circumference) esophagitis with       bleeding was found in the distal esophagus.      One superficial esophageal ulcer oozing blood was found at the       gastroesophageal junction.      The Z-line was found 45 cm from the incisors.      Segmental mild inflammation characterized by congestion (edema) and       erythema was found in the gastric antrum.      The cardia and gastric fundus were normal on retroflexion.      The examined duodenum was normal. Impression:               - LA Grade C reflux esophagitis with bleeding.                           - Esophageal ulcer oozing blood.                           - Z-line, 45 cm from the incisors.                           -  Acute gastritis.                           - Normal examined duodenum.                           - No specimens collected. Recommendation:           - Clear liquid diet.                           - Observe patient's clinical course. Procedure Code(s):        --- Professional ---                           [phone], Esophagogastroduodenoscopy, flexible,                            transoral; diagnostic, including collection of                            specimen(s) by brushing or washing, when performed                            (separate procedure) Diagnosis Code(s):        --- Professional ---                            K92.0, Hematemesis                           K22.11, Ulcer of esophagus with bleeding                           K21.01, Gastro-esophageal reflux disease with                            esophagitis, with bleeding                           K29.00, Acute gastritis without bleeding CPT copyright 2022 American Medical Association. All rights reserved. The codes documented in this report are preliminary and upon coder review may  be revised to meet current compliance requirements. [name], MD 07/15/2023 11:39:58 AM This report has been signed electronically. Number of Addenda: 0

## 2023-07-15 NOTE — Anesthesia Postprocedure Evaluation (Signed)
Anesthesia Post Note  Patient: [name]  Procedure(s) Performed: ESOPHAGOGASTRODUODENOSCOPY (EGD) WITH PROPOFOL     Patient location during evaluation: PACU Anesthesia Type: General Level of consciousness: awake and alert and oriented Pain management: pain level controlled Vital Signs Assessment: post-procedure vital signs reviewed and stable Respiratory status: spontaneous breathing, nonlabored ventilation and respiratory function stable Cardiovascular status: blood pressure returned to baseline and stable Postop Assessment: no apparent nausea or vomiting Anesthetic complications: no   No notable events documented.  Last Vitals:  Vitals:   07/15/23 1250 07/15/23 1300  BP: (!) 174/85 (!) 171/87  Pulse: 88 88  Resp: 12 10  Temp:    SpO2: 94% 95%    Last Pain:  Vitals:   07/15/23 1250  TempSrc:   PainSc: 5                  Jomar Denz A.

## 2023-07-15 NOTE — Assessment & Plan Note (Signed)
07-15-2023. Stable. Monitor.

## 2023-07-15 NOTE — ED Notes (Signed)
Pt voices anger that she has been kept NPO for a procedure that has not been discussed with her. This RN explained to patient that the time slot needed to be held and that provider would not perform any procedure without fully explaining it to her. Patient tells RN :that should have been done. You're starving me, and I don't want to go." Called endo department and shared patient concerns with nurse who will let provider know.

## 2023-07-15 NOTE — Assessment & Plan Note (Signed)
07-15-2023 pt with type 1 DM.  Had C-peptide in 12-2012 that was low at 0.15. will placed on SSI and low dose tresiba. Pt has reported allergy to lantus.  Lab Results  Component Value Date/Time   CPEPTIDE 0.15 (L) 12/18/2012 09:25 AM

## 2023-07-15 NOTE — Assessment & Plan Note (Signed)
07-15-2023 admitted for N/V. Hx of gastroparesis. EGD today showed gastritis and esophagitis. On IV protonix. On clear liquids.

## 2023-07-15 NOTE — Assessment & Plan Note (Signed)
07-15-2023 nephrology consulted for HD. Pt has right internal jugular HD catheter. Pt scheduled for AVG on 07-17-2023.  Will ask vascular surgery if they want to perform surgery as scheduled on 07-17-2023

## 2023-07-15 NOTE — Interval H&P Note (Signed)
History and Physical Interval Note:  07/15/2023 11:09 AM  [name]  has presented today for surgery, with the diagnosis of Hematemesis; Nausea and vomiting.  The various methods of treatment have been discussed with the patient and family. After consideration of risks, benefits and other options for treatment, the patient has consented to  Procedure(s): ESOPHAGOGASTRODUODENOSCOPY (EGD) WITH PROPOFOL (N/A) as a surgical intervention.  The patient's history has been reviewed, patient examined, no change in status, stable for surgery.  I have reviewed the patient's chart and labs.  Questions were answered to the patient's satisfaction.     [name]

## 2023-07-15 NOTE — Subjective & Objective (Signed)
Pt seen and examined. She had EGD today that showed gastritis and esophagitis. Had HD yesterday. Remains on clear liquids.

## 2023-07-15 NOTE — Plan of Care (Signed)
  Problem: Clinical Measurements: Goal: Diagnostic test results will improve Outcome: Progressing   Problem: Activity: Goal: Risk for activity intolerance will decrease 07/15/2023 1828 by Elvera Maria, RN Outcome: Progressing 07/15/2023 1828 by Elvera Maria, RN Outcome: Progressing   Problem: Nutrition: Goal: Adequate nutrition will be maintained 07/15/2023 1828 by Elvera Maria, RN Outcome: Progressing 07/15/2023 1828 by Elvera Maria, RN Outcome: Progressing   Problem: Coping: Goal: Level of anxiety will decrease Outcome: Progressing   Problem: Elimination: Goal: Will not experience complications related to bowel motility Outcome: Progressing

## 2023-07-15 NOTE — Progress Notes (Signed)
   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  Prior/Current Home Services No current home services (Has never has Home Health)  Readmission risk has been reviewed Yes (37%)  Transition of care needs transition of care needs identified, TOC will continue to follow (will need transportation resources)   TOC will continue to follow patient for any additional discharge needs

## 2023-07-15 NOTE — Progress Notes (Signed)
[facility] Progress Note   Subjective:   Pt seen post endoscopy. Reports she is hungry. Mild nausea. Denies SOB, CP, dizziness.   Objective Vitals:   07/15/23 1300 07/15/23 1310 07/15/23 1320 07/15/23 1345  BP: (!) 171/87 (!) 168/81 (!) 171/88 (!) 156/83  Pulse: 88 86 87 89  Resp: 10 16 15 13   Temp:      TempSrc:      SpO2: 95% 94% 96% 96%   Physical Exam General: Alert female in NAD Heart: RRR, no murmurs, rubs or gallops Lungs: CTA bilaterally, respirations unlabored on RA Abdomen: Soft, non-distended, +BS Extremities: No edema b/l lower extremities Dialysis Access: [facility]  Additional Objective Labs: Basic Metabolic Panel: Recent Labs  Lab 07/14/23 0645 07/14/23 0703 07/14/23 1811 07/15/23 1036  NA 137 137 141 137  K 3.4* 3.5 3.7 3.6  CL 99 105 101 98  CO2 19*  --  23  --   GLUCOSE 261* 272* 181* 159*  BUN 49* 50* 56* 26*  CREATININE 7.69* 8.60* 8.35* 5.30*  CALCIUM 9.3  --  9.4  --   PHOS 5.5*  --   --   --    Liver Function Tests: Recent Labs  Lab 07/14/23 0645 07/14/23 1811  AST 13* 14*  ALT 8 7  ALKPHOS 68 68  BILITOT 1.7* 1.6*  PROT 8.0 7.5  ALBUMIN 4.0 3.8   Recent Labs  Lab 07/14/23 0645  LIPASE 23   CBC: Recent Labs  Lab 07/14/23 0645 07/14/23 0703 07/14/23 1811 07/15/23 0740 07/15/23 1036  WBC 8.4  --  8.0  --   --   NEUTROABS  --   --  5.9  --   --   HGB 11.2*   < > 11.1* 9.9* 11.6*  HCT 34.5*   < > 34.0* 29.9* 34.0*  MCV 91.0  --  89.9  --   --   PLT 241  --  249  --   --    < > = values in this interval not displayed.   Blood Culture    Component Value Date/Time   SDES BLOOD RIGHT ANTECUBITAL 10/21/2022 1856   SPECREQUEST  10/21/2022 1856    BOTTLES DRAWN AEROBIC AND ANAEROBIC Blood Culture results may not be optimal due to an inadequate volume of blood received in culture bottles   CULT  10/21/2022 1856    NO GROWTH 5 DAYS Performed at [facility], [address] [place], [place] [address]     REPTSTATUS 10/26/2022 FINAL 10/21/2022 1856    Cardiac Enzymes: No results for input(s): "CKTOTAL", "CKMB", "CKMBINDEX", "TROPONINI" in the last 168 hours. CBG: Recent Labs  Lab 07/14/23 1019 07/14/23 1226 07/14/23 1755 07/15/23 0405 07/15/23 0847  GLUCAP 273* 223* 152* 136* 154*   Iron Studies: No results for input(s): "IRON", "TIBC", "TRANSFERRIN", "FERRITIN" in the last 72 hours. @lablastinr3 @ Studies/Results: CT ABDOMEN PELVIS WO CONTRAST  Result Date: 07/14/2023 CLINICAL DATA:  Acute generalized abdominal pain. EXAM: CT ABDOMEN AND PELVIS WITHOUT CONTRAST TECHNIQUE: Multidetector CT imaging of the abdomen and pelvis was performed following the standard protocol without IV contrast. RADIATION DOSE REDUCTION: This exam was performed according to the departmental dose-optimization program which includes automated exposure control, adjustment of the mA and/or kV according to patient size and/or use of iterative reconstruction technique. COMPARISON:  October 05, 2022. FINDINGS: Lower chest: No acute abnormality. Hepatobiliary: No focal liver abnormality is seen. No gallstones, gallbladder wall thickening, or biliary dilatation. Pancreas: Unremarkable. No pancreatic  ductal dilatation or surrounding inflammatory changes. Spleen: Normal in size without focal abnormality. Adrenals/Urinary Tract: Adrenal glands are unremarkable. Kidneys are normal, without renal calculi, focal lesion, or hydronephrosis. Bladder is unremarkable. Stomach/Bowel: Small sliding-type hiatal hernia. There is no evidence of bowel obstruction or inflammation. The appendix appears normal. Vascular/Lymphatic: No significant vascular findings are present. No enlarged abdominal or pelvic lymph nodes. Reproductive: Uterus and bilateral adnexa are unremarkable. Other: Small fat containing periumbilical hernia.  No ascites. Musculoskeletal: No acute or significant osseous findings. IMPRESSION: Small sliding-type hiatal hernia. Small fat  containing periumbilical hernia. No other abnormality seen in the abdomen pelvis. Electronically Signed   By: Lupita Raider M.D.   On: 07/14/2023 08:37   DG Chest Portable 1 View  Result Date: 07/14/2023 CLINICAL DATA:  Upper abdominal pain EXAM: PORTABLE CHEST 1 VIEW COMPARISON:  04/30/2023 FINDINGS: Central line with tip at the upper right atrium. Single chamber ICD lead into the right ventricle. There is no edema, consolidation, effusion, or pneumothorax. Normal heart size and mediastinal contours. IMPRESSION: No evidence of active disease. Electronically Signed   By: Tiburcio Pea M.D.   On: 07/14/2023 07:07   Medications:  sodium chloride 40 mL/hr at 07/14/23 1820   promethazine (PHENERGAN) injection (IM or IVPB) Stopped (07/15/23 9811)    Chlorhexidine Gluconate Cloth  6 each Topical Q0600   insulin aspart  0-6 Units Subcutaneous Q4H   pantoprazole (PROTONIX) IV  40 mg Intravenous Q8H   Followed by   Melene Muller ON 07/17/2023] pantoprazole (PROTONIX) IV  40 mg Intravenous Q12H    Dialysis Orders: Center: [facility]  on MWF. 180Nre 4 hours BFR 400 DFR Auto 1.5 3K 2.5Ca EDW 74.9kg Heparin 3000 unit bolus and 2000 unit bolus mid run Mircera IV q 4 weeks  Venofer 50mg  IV weekly  Calcitriol 0.33mcg Po q HD  Assessment/Plan: Refractory nausea: Likely acute exacerbation of diabetic gastroparesis. S/p EGD today. Per GI.  ESRD:  Had HD yesterday evening, tolerated will. Continue MWF schedule  Hypertension/volume: Was unable to keep BP meds down. BP now improved. Euvolemic on exam, will get standing weights when she is able.   Anemia: Hgb at goal, no ESA indicated at this time.  Metabolic bone disease: Calcium controlled. Continue VDRA once tolerating PO meds. Phos at goal, resume binders when eating  Nutrition:  Currently NPO Dialysis access: Reports she was scheduled for new AVG this week, will alert [facility] if patient remains admitted at that time.   Rogers Blocker,  PA-C 07/15/2023, 2:30 PM  [facility] Pager: [phone]

## 2023-07-15 NOTE — Anesthesia Procedure Notes (Signed)
Procedure Name: Intubation Date/Time: 07/15/2023 11:20 AM  Performed by: Hilda Lias, CRNAPre-anesthesia Checklist: Patient identified, Emergency Drugs available, Suction available and Patient being monitored Patient Re-evaluated:Patient Re-evaluated prior to induction Oxygen Delivery Method: Circle System Utilized Preoxygenation: Pre-oxygenation with 100% oxygen Induction Type: IV induction Ventilation: Mask ventilation without difficulty Laryngoscope Size: Mac and 4 Grade View: Grade II Tube type: Oral Tube size: 7.5 mm Number of attempts: 1 Airway Equipment and Method: Stylet and Oral airway Placement Confirmation: ETT inserted through vocal cords under direct vision, positive ETCO2 and breath sounds checked- equal and bilateral Secured at: 22 cm Tube secured with: Tape Dental Injury: Teeth and Oropharynx as per pre-operative assessment  Comments: RSI. Hx gastroparesis

## 2023-07-15 NOTE — Progress Notes (Signed)
Received patient in bed to unit.  Alert and oriented.  Informed consent signed and in chart.   TX duration: 3:30  Patient tolerated well.  Transported back to the room  Alert, without acute distress.  Hand-off given to patient's nurse.   Access used: RIJ TDC Access issues: None  Total UF removed: 500 mL Medication(s) given: Protonix 40 mg  Post HD VS: please data insert    07/15/23 0216  Vitals  Temp 98.7 F (37.1 C)  Temp Source Oral  BP (!) 145/95  MAP (mmHg) 110  BP Location Left Wrist  BP Method Automatic  Patient Position (if appropriate) Lying  Pulse Rate 97  Pulse Rate Source Monitor  ECG Heart Rate 97  Resp 17  Oxygen Therapy  SpO2 100 %  O2 Device Room Air  Patient Activity (if Appropriate) In bed  Pulse Oximetry Type Continuous  Post Treatment  Dialyzer Clearance Lightly streaked  Hemodialysis Intake (mL) 0 mL  Liters Processed 63.3  Fluid Removed (mL) 500 mL  Tolerated HD Treatment Yes  Post-Hemodialysis Comments Treatment complete and blood returned. Unable to meet prescribed UF goal d/t decreased BP; UF removal temporarily paused and UF goal reduced with sx resolve.  Note  Patient Observations Pateitn awakened from sleep, no acute distress noted; patient condition stable for return transport.  Hemodialysis Catheter Right Subclavian Double lumen Permanent (Tunneled)  Placement Date/Time: 10/07/22 1458   Serial / Lot #: [id number]  Expiration Date: 05/11/27  Time Out: Correct patient;Correct site;Correct procedure  Maximum sterile barrier precautions: Hand hygiene;Cap;Mask;Sterile gown;Sterile gloves;Large sterile ...  Site Condition No complications  Blue Lumen Status Flushed;Heparin locked;Dead end cap in place  Red Lumen Status Flushed;Heparin locked;Dead end cap in place  Purple Lumen Status N/A  Catheter fill solution Heparin 1000 units/ml  Catheter fill volume (Arterial) 1.9 cc  Catheter fill volume (Venous) 1.9  Dressing Type Transparent   Dressing Status Antimicrobial disc in place;Clean, Dry, Intact  Drainage Description None  Post treatment catheter status Capped and Clamped      [facility]

## 2023-07-15 NOTE — ED Notes (Signed)
Patient put on bedpan, patient nauseous, asked for phenergan from pharmacy.

## 2023-07-15 NOTE — Progress Notes (Signed)
PROGRESS NOTE    [name]  ZDG:[id number] DOB: 1971/06/24 DOA: 07/14/2023 PCP: Delma Officer, PA  Subjective: Pt seen and examined. She had EGD today that showed gastritis and esophagitis. Had HD yesterday. Remains on clear liquids.   Hospital Course: No notes on file   Assessment and Plan: * Intractable nausea and vomiting secondary to diabetic gastroparesis 07-15-2023 admitted for N/V. Hx of gastroparesis. EGD today showed gastritis and esophagitis. On IV protonix. On clear liquids.  Hypokalemia 07-15-2023. Stable. Monitor.  ESRD on dialysis [facility]) 07-15-2023 nephrology consulted for HD. Pt has right internal jugular HD catheter. Pt scheduled for AVG on 07-17-2023.  Will ask vascular surgery if they want to perform surgery as scheduled on 07-17-2023  Hypertension, uncontrolled 07-15-2023 restart isordil, coreg, hydralazine.  Uncontrolled type 1 diabetes mellitus with hyperglycemia, with long-term current use of insulin (HCC) 07-15-2023 pt with type 1 DM.  Had C-peptide in 12-2012 that was low at 0.15. will placed on SSI and low dose tresiba. Pt has reported allergy to lantus.  Lab Results  Component Value Date/Time   CPEPTIDE 0.15 (L) 12/18/2012 09:25 AM      DVT prophylaxis: SCDs Start: 07/14/23 1539    Code Status: Full Code Family Communication: no family at bedside Disposition Plan: return home Reason for continuing need for hospitalization: remains on clear liquids and IV protonix. Will ask vascular surgery if they want to get AVG placed while pt is here.  Objective: Vitals:   07/15/23 1300 07/15/23 1310 07/15/23 1320 07/15/23 1345  BP: (!) 171/87 (!) 168/81 (!) 171/88 (!) 156/83  Pulse: 88 86 87 89  Resp: 10 16 15 13   Temp:      TempSrc:      SpO2: 95% 94% 96% 96%    Intake/Output Summary (Last 24 hours) at 07/15/2023 1542 Last data filed at 07/15/2023 0216 Gross per 24 hour  Intake --  Output 500 ml  Net -500 ml   There were no vitals  filed for this visit.  Examination:  Physical Exam Vitals and nursing note reviewed.  Constitutional:      General: She is not in acute distress.    Appearance: She is not toxic-appearing.  HENT:     Head: Normocephalic and atraumatic.     Nose: Nose normal.  Cardiovascular:     Rate and Rhythm: Normal rate and regular rhythm.  Pulmonary:     Effort: Pulmonary effort is normal.     Breath sounds: Normal breath sounds.  Abdominal:     General: Bowel sounds are normal.     Palpations: Abdomen is soft.  Musculoskeletal:     Comments: Right ant chest HD catheter  Skin:    General: Skin is warm and dry.  Neurological:     General: No focal deficit present.     Mental Status: She is alert and oriented to person, place, and time.     Data Reviewed: I have personally reviewed following labs and imaging studies  CBC: Recent Labs  Lab 07/14/23 0645 07/14/23 0703 07/14/23 1811 07/15/23 0740 07/15/23 1036 07/15/23 1413  WBC 8.4  --  8.0  --   --  9.8  NEUTROABS  --   --  5.9  --   --   --   HGB 11.2* 11.9* 11.1* 9.9* 11.6* 10.3*  HCT 34.5* 35.0* 34.0* 29.9* 34.0* 31.6*  MCV 91.0  --  89.9  --   --  90.5  PLT 241  --  249  --   --  200   Basic Metabolic Panel: Recent Labs  Lab 07/14/23 0645 07/14/23 0703 07/14/23 1811 07/15/23 1036 07/15/23 1413  NA 137 137 141 137 135  K 3.4* 3.5 3.7 3.6 3.4*  CL 99 105 101 98 97*  CO2 19*  --  23  --  25  GLUCOSE 261* 272* 181* 159* 161*  BUN 49* 50* 56* 26* 26*  CREATININE 7.69* 8.60* 8.35* 5.30* 5.50*  CALCIUM 9.3  --  9.4  --  9.0  MG 2.2  --   --   --   --   PHOS 5.5*  --   --   --   --    GFR: CrCl cannot be calculated (Unknown ideal weight.). Liver Function Tests: Recent Labs  Lab 07/14/23 0645 07/14/23 1811 07/15/23 1413  AST 13* 14* 17  ALT 8 7 9   ALKPHOS 68 68 62  BILITOT 1.7* 1.6* 2.0*  PROT 8.0 7.5 7.3  ALBUMIN 4.0 3.8 3.7   Recent Labs  Lab 07/14/23 0645  LIPASE 23   BNP (last 3 results) Recent  Labs    10/04/22 1000 10/08/22 0606 10/09/22 0602  BNP 1,496.7* 2,182.7* 1,719.6*   CBG: Recent Labs  Lab 07/14/23 1226 07/14/23 1755 07/15/23 0405 07/15/23 0847 07/15/23 1452  GLUCAP 223* 152* 136* 154* 167*   Sepsis Labs: Recent Labs  Lab 07/14/23 0703 07/14/23 0900  LATICACIDVEN 1.5 0.9   Radiology Studies: CT ABDOMEN PELVIS WO CONTRAST  Result Date: 07/14/2023 CLINICAL DATA:  Acute generalized abdominal pain. EXAM: CT ABDOMEN AND PELVIS WITHOUT CONTRAST TECHNIQUE: Multidetector CT imaging of the abdomen and pelvis was performed following the standard protocol without IV contrast. RADIATION DOSE REDUCTION: This exam was performed according to the departmental dose-optimization program which includes automated exposure control, adjustment of the mA and/or kV according to patient size and/or use of iterative reconstruction technique. COMPARISON:  October 05, 2022. FINDINGS: Lower chest: No acute abnormality. Hepatobiliary: No focal liver abnormality is seen. No gallstones, gallbladder wall thickening, or biliary dilatation. Pancreas: Unremarkable. No pancreatic ductal dilatation or surrounding inflammatory changes. Spleen: Normal in size without focal abnormality. Adrenals/Urinary Tract: Adrenal glands are unremarkable. Kidneys are normal, without renal calculi, focal lesion, or hydronephrosis. Bladder is unremarkable. Stomach/Bowel: Small sliding-type hiatal hernia. There is no evidence of bowel obstruction or inflammation. The appendix appears normal. Vascular/Lymphatic: No significant vascular findings are present. No enlarged abdominal or pelvic lymph nodes. Reproductive: Uterus and bilateral adnexa are unremarkable. Other: Small fat containing periumbilical hernia.  No ascites. Musculoskeletal: No acute or significant osseous findings. IMPRESSION: Small sliding-type hiatal hernia. Small fat containing periumbilical hernia. No other abnormality seen in the abdomen pelvis. Electronically  Signed   By: Lupita Raider M.D.   On: 07/14/2023 08:37   DG Chest Portable 1 View  Result Date: 07/14/2023 CLINICAL DATA:  Upper abdominal pain EXAM: PORTABLE CHEST 1 VIEW COMPARISON:  04/30/2023 FINDINGS: Central line with tip at the upper right atrium. Single chamber ICD lead into the right ventricle. There is no edema, consolidation, effusion, or pneumothorax. Normal heart size and mediastinal contours. IMPRESSION: No evidence of active disease. Electronically Signed   By: Tiburcio Pea M.D.   On: 07/14/2023 07:07    Scheduled Meds:  carvedilol  3.125 mg Oral BID   [START ON 07/16/2023] Chlorhexidine Gluconate Cloth  6 each Topical Q0600   hydrALAZINE  50 mg Oral TID   insulin aspart  0-5 Units Subcutaneous QHS   insulin aspart  0-6 Units Subcutaneous Q4H  insulin aspart  0-9 Units Subcutaneous TID WC   insulin degludec  10 Units Subcutaneous QHS   isosorbide dinitrate  20 mg Oral TID   pantoprazole (PROTONIX) IV  40 mg Intravenous Q8H   Followed by   [START ON 07/17/2023] pantoprazole (PROTONIX) IV  40 mg Intravenous Q12H   Continuous Infusions:  promethazine (PHENERGAN) injection (IM or IVPB) Stopped (07/15/23 0713)     LOS: 1 day   Time spent: 40 minutes  Carollee Herter, DO  [facility]  07/15/2023, 3:42 PM

## 2023-07-15 NOTE — Transfer of Care (Signed)
Immediate Anesthesia Transfer of Care Note  Patient: [name]  Procedure(s) Performed: ESOPHAGOGASTRODUODENOSCOPY (EGD) WITH PROPOFOL  Patient Location: Endoscopy Unit  Anesthesia Type:General  Level of Consciousness: awake, oriented, and patient cooperative  Airway & Oxygen Therapy: Patient Spontanous Breathing and Patient connected to nasal cannula oxygen  Post-op Assessment: Report given to RN and Post -op Vital signs reviewed and stable  Post vital signs: Reviewed and stable  Last Vitals:  Vitals Value Taken Time  BP 181/84 07/15/23 1156  Temp    Pulse 90 07/15/23 1200  Resp 14 07/15/23 1200  SpO2 98 % 07/15/23 1200  Vitals shown include unfiled device data.  Last Pain:  Vitals:   07/15/23 1156  TempSrc:   PainSc: 9          Complications: No notable events documented.

## 2023-07-16 DIAGNOSIS — R112 Nausea with vomiting, unspecified: Secondary | ICD-10-CM | POA: Diagnosis not present

## 2023-07-16 LAB — CBC
HCT: 28.1 % — ABNORMAL LOW (ref 36.0–46.0)
Hemoglobin: 9.3 g/dL — ABNORMAL LOW (ref 12.0–15.0)
MCH: 29.9 pg (ref 26.0–34.0)
MCHC: 33.1 g/dL (ref 30.0–36.0)
MCV: 90.4 fL (ref 80.0–100.0)
Platelets: 171 10*3/uL (ref 150–400)
RBC: 3.11 MIL/uL — ABNORMAL LOW (ref 3.87–5.11)
RDW: 14.2 % (ref 11.5–15.5)
WBC: 7 10*3/uL (ref 4.0–10.5)
nRBC: 0 % (ref 0.0–0.2)

## 2023-07-16 LAB — GLUCOSE, CAPILLARY
Glucose-Capillary: 110 mg/dL — ABNORMAL HIGH (ref 70–99)
Glucose-Capillary: 228 mg/dL — ABNORMAL HIGH (ref 70–99)
Glucose-Capillary: 288 mg/dL — ABNORMAL HIGH (ref 70–99)

## 2023-07-16 LAB — RENAL FUNCTION PANEL
Albumin: 3.4 g/dL — ABNORMAL LOW (ref 3.5–5.0)
Anion gap: 10 (ref 5–15)
BUN: 34 mg/dL — ABNORMAL HIGH (ref 6–20)
CO2: 25 mmol/L (ref 22–32)
Calcium: 8.6 mg/dL — ABNORMAL LOW (ref 8.9–10.3)
Chloride: 98 mmol/L (ref 98–111)
Creatinine, Ser: 7.1 mg/dL — ABNORMAL HIGH (ref 0.44–1.00)
GFR, Estimated: 6 mL/min — ABNORMAL LOW (ref >60)
Glucose, Bld: 219 mg/dL — ABNORMAL HIGH (ref 70–99)
Phosphorus: 5.5 mg/dL — ABNORMAL HIGH (ref 2.5–4.6)
Potassium: 3.6 mmol/L (ref 3.5–5.1)
Sodium: 133 mmol/L — ABNORMAL LOW (ref 135–145)

## 2023-07-16 MED ORDER — SODIUM CHLORIDE 0.9 % IV SOLN
1.5000 g | INTRAVENOUS | Status: AC
  Filled 2023-07-16: qty 1.5

## 2023-07-16 MED ORDER — DARBEPOETIN ALFA 40 MCG/0.4ML IJ SOSY
40.0000 ug | PREFILLED_SYRINGE | INTRAMUSCULAR | Status: DC
  Filled 2023-07-16: qty 0.4

## 2023-07-16 NOTE — Plan of Care (Signed)
  Problem: Education: Goal: Knowledge of General Education information will improve Description: Including pain rating scale, medication(s)/side effects and non-pharmacologic comfort measures 07/16/2023 1915 by Inez Pilgrim, RN Outcome: Progressing 07/16/2023 1914 by Inez Pilgrim, RN Outcome: Progressing   Problem: Health Behavior/Discharge Planning: Goal: Ability to manage health-related needs will improve Outcome: Progressing   Problem: Clinical Measurements: Goal: Ability to maintain clinical measurements within normal limits will improve Outcome: Progressing Goal: Will remain free from infection Outcome: Progressing   Problem: Nutrition: Goal: Adequate nutrition will be maintained Outcome: Progressing   Problem: Coping: Goal: Level of anxiety will decrease Outcome: Progressing   Problem: Elimination: Goal: Will not experience complications related to urinary retention Outcome: Progressing   Problem: Pain Management: Goal: General experience of comfort will improve Outcome: Progressing

## 2023-07-16 NOTE — H&P (View-Only) (Signed)
[facility] [place] PROGRESS NOTE  ASSESSMENT / PLAN: [name] is a 52 y.o. female admitted to the hospital for nausea and vomiting secondary to diabetic gastroparesis.  This has improved significantly and she has stabilized.  I scheduled her for a right arm arteriovenous graft prior to her admission.  She appears stable for graft creation, which she is anxious to do.  Plan to do this as scheduled tomorrow.  SUBJECTIVE: Visit with the patient and discussed her admission with her.  She is hopeful to do her planned dialysis access surgery tomorrow.  She has improved significantly from her nausea and vomiting that brought her to the hospital.  She has no signs or symptoms of infection.  I think is safe to proceed.  OBJECTIVE: BP (!) 142/85 (BP Location: Left Wrist)   Pulse 89   Temp 98.2 F (36.8 C) (Oral)   Resp 15   Wt 73.9 kg Comment: BED  LMP 11/17/2021 (Approximate)   SpO2 98%   BMI 22.10 kg/m   Intake/Output Summary (Last 24 hours) at 07/16/2023 1456 Last data filed at 07/16/2023 1211 Gross per 24 hour  Intake 120 ml  Output 800 ml  Net -680 ml    Middle-age woman in no distress Regular rate and rhythm Unlabored breathing 2+ right brachial and radial pulse     Latest Ref Rng & Units 07/16/2023    7:30 AM 07/15/2023    2:13 PM 07/15/2023   10:36 AM  CBC  WBC 4.0 - 10.5 K/uL 7.0  9.8    Hemoglobin 12.0 - 15.0 g/dL 9.3  95.2  84.1   Hematocrit 36.0 - 46.0 % 28.1  31.6  34.0   Platelets 150 - 400 K/uL 171  200          Latest Ref Rng & Units 07/16/2023    7:30 AM 07/15/2023    2:13 PM 07/15/2023   10:36 AM  CMP  Glucose 70 - 99 mg/dL 324  401  027   BUN 6 - 20 mg/dL 34  26  26   Creatinine 0.44 - 1.00 mg/dL 2.53  6.64  4.03   Sodium 135 - 145 mmol/L 133  135  137   Potassium 3.5 - 5.1 mmol/L 3.6  3.4  3.6   Chloride 98 - 111 mmol/L 98  97  98   CO2 22 - 32 mmol/L 25  25    Calcium 8.9 - 10.3 mg/dL 8.6  9.0    Total Protein 6.5 -  8.1 g/dL  7.3    Total Bilirubin <1.2 mg/dL  2.0    Alkaline Phos 38 - 126 U/L  62    AST 15 - 41 U/L  17    ALT 0 - 44 U/L  9      Estimated Creatinine Clearance: 10.7 mL/min (A) (by C-G formula based on SCr of 7.1 mg/dL (H)).  Rande Brunt. Lenell Antu, MD [facility] Phone Number: [phone] 07/16/2023 2:56 PM

## 2023-07-16 NOTE — Plan of Care (Signed)

## 2023-07-16 NOTE — Progress Notes (Signed)
   07/16/23 0948  During Treatment Monitoring  Intra-Hemodialysis Comments  (tx paused due to system clotting, pt blood was returned, new cartridge priming)

## 2023-07-16 NOTE — Anesthesia Preprocedure Evaluation (Signed)
Anesthesia Evaluation  Patient identified by MRN, date of birth, ID band Patient awake    Reviewed: Allergy & Precautions, NPO status , Patient's Chart, lab work & pertinent test results, reviewed documented beta blocker date and time   Airway Mallampati: II  TM Distance: >3 FB Neck ROM: Full    Dental no notable dental hx. (+) Missing, Chipped, Dental Advisory Given, Caps   Pulmonary asthma , pneumonia, resolved   Pulmonary exam normal breath sounds clear to auscultation       Cardiovascular hypertension, Pt. on medications + CAD, + Past MI and +CHF (wrEF)  Normal cardiovascular exam+ Cardiac Defibrillator  Rhythm:Regular Rate:Normal  EKG 07/14/23 Sinus Tachycardia Ventricular premature complex Probable left atrial enlargement Probable LVH with secondary repol abnormality  Echo 06/16/23 1. Left ventricular ejection fraction, by estimation, is 35 to 40%. The  left ventricle has moderately decreased function. The left ventricle  demonstrates global hypokinesis. Left ventricular diastolic parameters are  consistent with Grade II diastolic  dysfunction (pseudonormalization). Elevated left ventricular end-diastolic  pressure. The average left ventricular global longitudinal strain is -8.9  %. The global longitudinal strain is abnormal.   2. Right ventricular systolic function is normal. The right ventricular  size is normal. There is normal pulmonary artery systolic pressure.   3. Left atrial size was severely dilated.   4. The mitral valve is degenerative. Trivial mitral valve regurgitation.  No evidence of mitral stenosis.   5. The aortic valve is tricuspid. Aortic valve regurgitation is not  visualized. No aortic stenosis is present.   6. The inferior vena cava is normal in size with greater than 50%  respiratory variability, suggesting right atrial pressure of 3 mmHg.      Neuro/Psych  Headaches PSYCHIATRIC DISORDERS  Anxiety Depression    ClaustrophobiaDiabetic neuropathy    GI/Hepatic Neg liver ROS,,,Gastroparesis Hematemesis N/V   Endo/Other  diabetes, Poorly Controlled, Type 1, Insulin Dependent  Hyperlipidemia  Renal/GU ESRF and DialysisRenal diseaseLast dialysis this am  negative genitourinary   Musculoskeletal  (+) Arthritis , Osteoarthritis,    Abdominal   Peds  Hematology  (+) Blood dyscrasia, anemia   Anesthesia Other Findings All: see list  Reproductive/Obstetrics                             Anesthesia Physical Anesthesia Plan  ASA: 4  Anesthesia Plan: MAC and Regional   Post-op Pain Management: Minimal or no pain anticipated   Induction:   PONV Risk Score and Plan: Ondansetron and Propofol infusion  Airway Management Planned: Natural Airway and Mask  Additional Equipment: None  Intra-op Plan:   Post-operative Plan:   Informed Consent: I have reviewed the patients History and Physical, chart, labs and discussed the procedure including the risks, benefits and alternatives for the proposed anesthesia with the patient or authorized representative who has indicated his/her understanding and acceptance.     Dental advisory given  Plan Discussed with: CRNA and Anesthesiologist  Anesthesia Plan Comments: (Mac w R ISB)        Anesthesia Quick Evaluation

## 2023-07-16 NOTE — Plan of Care (Signed)
  Problem: Education: Goal: Knowledge of General Education information will improve Description: Including pain rating scale, medication(s)/side effects and non-pharmacologic comfort measures Outcome: Progressing   Problem: Nutrition: Goal: Adequate nutrition will be maintained Outcome: Progressing   Problem: Coping: Goal: Level of anxiety will decrease Outcome: Progressing   Problem: Elimination: Goal: Will not experience complications related to urinary retention Outcome: Progressing   Problem: Pain Management: Goal: General experience of comfort will improve Outcome: Progressing

## 2023-07-16 NOTE — Progress Notes (Signed)
Received patient in bed to unit.  Alert and oriented.  Informed consent signed and in chart.   TX duration:3.5  Patient tolerated well.  Transported back to the room  Alert, without acute distress.  Hand-off given to patient's nurse.   Access used: right HD catheter Access issues: none  Total UF removed: Medication(s) given: none   07/16/23 1208  Vitals  Temp 98.2 F (36.8 C)  Temp Source Oral  BP 98/71  MAP (mmHg) 82  BP Location Left Wrist  BP Method Automatic  Patient Position (if appropriate) Lying  Pulse Rate 90  Pulse Rate Source Monitor  ECG Heart Rate 90  Resp 12  Oxygen Therapy  SpO2 99 %  O2 Device Room Air  During Treatment Monitoring  Blood Flow Rate (mL/min) 349 mL/min  Arterial Pressure (mmHg) -182.82 mmHg  Venous Pressure (mmHg) 148.48 mmHg  TMP (mmHg) 6.26 mmHg  Ultrafiltration Rate (mL/min) 0 mL/min  Dialysate Flow Rate (mL/min) 299 ml/min  Dialysate Potassium Concentration 3  Dialysate Calcium Concentration 2.5  Duration of HD Treatment -hour(s) 3.48 hour(s)  Cumulative Fluid Removed (mL) per Treatment  787.51  HD Safety Checks Performed Yes  Intra-Hemodialysis Comments Tx completed  Dialysis Fluid Bolus Normal Saline  Bolus Amount (mL) 300 mL      Rylinn Linzy S [facility]

## 2023-07-16 NOTE — Progress Notes (Signed)
[facility] Progress Note   Subjective:   Seen on HD, reports she is feeling much better. Denies SOB, CP, dizziness, nausea. Asks when she can eat regular food- discussed that is up to GI.   Objective Vitals:   07/16/23 1130 07/16/23 1208 07/16/23 1211 07/16/23 1214  BP: 115/79 98/71 (!) 142/85   Pulse: 94 90 89   Resp: 19 12 15    Temp:  98.2 F (36.8 C)    TempSrc:  Oral    SpO2: 100% 99% 98%   Weight:    73.9 kg   Physical Exam General: Alert female in NAD Heart: RRR, no murmurs, rubs or gallops Lungs: CTA bilaterally, respirations unlabored Abdomen: Soft, non-distended, +BS Extremities: No edema b/l lower extremities Dialysis Access:  [facility] accessed  Additional Objective Labs: Basic Metabolic Panel: Recent Labs  Lab 07/14/23 0645 07/14/23 0703 07/14/23 1811 07/15/23 1036 07/15/23 1413 07/16/23 0730  NA 137   < > 141 137 135 133*  K 3.4*   < > 3.7 3.6 3.4* 3.6  CL 99   < > 101 98 97* 98  CO2 19*  --  23  --  25 25  GLUCOSE 261*   < > 181* 159* 161* 219*  BUN 49*   < > 56* 26* 26* 34*  CREATININE 7.69*   < > 8.35* 5.30* 5.50* 7.10*  CALCIUM 9.3  --  9.4  --  9.0 8.6*  PHOS 5.5*  --   --   --   --  5.5*   < > = values in this interval not displayed.   Liver Function Tests: Recent Labs  Lab 07/14/23 0645 07/14/23 1811 07/15/23 1413 07/16/23 0730  AST 13* 14* 17  --   ALT 8 7 9   --   ALKPHOS 68 68 62  --   BILITOT 1.7* 1.6* 2.0*  --   PROT 8.0 7.5 7.3  --   ALBUMIN 4.0 3.8 3.7 3.4*   Recent Labs  Lab 07/14/23 0645  LIPASE 23   CBC: Recent Labs  Lab 07/14/23 0645 07/14/23 0703 07/14/23 1811 07/15/23 0740 07/15/23 1036 07/15/23 1413 07/16/23 0730  WBC 8.4  --  8.0  --   --  9.8 7.0  NEUTROABS  --   --  5.9  --   --   --   --   HGB 11.2*   < > 11.1*   < > 11.6* 10.3* 9.3*  HCT 34.5*   < > 34.0*   < > 34.0* 31.6* 28.1*  MCV 91.0  --  89.9  --   --  90.5 90.4  PLT 241  --  249  --   --  200 171   < > = values in this interval  not displayed.   Blood Culture    Component Value Date/Time   SDES BLOOD RIGHT ANTECUBITAL 10/21/2022 1856   SPECREQUEST  10/21/2022 1856    BOTTLES DRAWN AEROBIC AND ANAEROBIC Blood Culture results may not be optimal due to an inadequate volume of blood received in culture bottles   CULT  10/21/2022 1856    NO GROWTH 5 DAYS Performed at [facility], [address], [place], [place] [address]    REPTSTATUS 10/26/2022 FINAL 10/21/2022 1856    Cardiac Enzymes: No results for input(s): "CKTOTAL", "CKMB", "CKMBINDEX", "TROPONINI" in the last 168 hours. CBG: Recent Labs  Lab 07/15/23 0847 07/15/23 1452 07/15/23 1618 07/15/23 2110 07/16/23 1120  GLUCAP 154* 167* 167* 93  110*   Iron Studies: No results for input(s): "IRON", "TIBC", "TRANSFERRIN", "FERRITIN" in the last 72 hours. @lablastinr3 @ Studies/Results: No results found. Medications:  promethazine (PHENERGAN) injection (IM or IVPB) Stopped (07/15/23 0713)    carvedilol  3.125 mg Oral BID   Chlorhexidine Gluconate Cloth  6 each Topical Q0600   hydrALAZINE  50 mg Oral TID   insulin aspart  0-5 Units Subcutaneous QHS   insulin aspart  0-9 Units Subcutaneous TID WC   insulin glargine-yfgn  10 Units Subcutaneous QHS   isosorbide dinitrate  20 mg Oral TID   pantoprazole (PROTONIX) IV  40 mg Intravenous Q8H   Followed by   Melene Muller ON 07/17/2023] pantoprazole (PROTONIX) IV  40 mg Intravenous Q12H    Dialysis Orders:  Center: [facility]  on MWF. 180Nre 4 hours BFR 400 DFR Auto 1.5 3K 2.5Ca EDW 74.9kg Heparin 3000 unit bolus and 2000 unit bolus mid run Mircera IV q 4 weeks  Venofer 50mg  IV weekly  Calcitriol 0.26mcg Po q HD  Assessment/Plan: Refractory nausea: Likely acute exacerbation of diabetic gastroparesis. S/p EGD today. Per GI.  ESRD:  Tolerating well. Continue MWF schedule  Hypertension/volume: Was unable to keep BP meds down. BP now improved. Euvolemic on exam, will get standing weights when she  is able.   Anemia: Hgb 9.3, likely needs higher ESA dose with recent GI bleed. Will start aranesp  Metabolic bone disease: Calcium controlled. Continue VDRA once tolerating PO meds. Phos at goal, resume binders when eating  Nutrition:  Currently on clear liquids Dialysis access: Reports she was scheduled for new AVG this week, VVS aware she is here and looks like still planning for AVG tomorrow  Rogers Blocker, PA-C 07/16/2023, 1:09 PM  [facility] Pager: [phone]

## 2023-07-16 NOTE — Progress Notes (Signed)
[facility] [place] PROGRESS NOTE  ASSESSMENT / PLAN: [name] is a 52 y.o. female admitted to the hospital for nausea and vomiting secondary to diabetic gastroparesis.  This has improved significantly and she has stabilized.  I scheduled her for a right arm arteriovenous graft prior to her admission.  She appears stable for graft creation, which she is anxious to do.  Plan to do this as scheduled tomorrow.  SUBJECTIVE: Visit with the patient and discussed her admission with her.  She is hopeful to do her planned dialysis access surgery tomorrow.  She has improved significantly from her nausea and vomiting that brought her to the hospital.  She has no signs or symptoms of infection.  I think is safe to proceed.  OBJECTIVE: BP (!) 142/85 (BP Location: Left Wrist)   Pulse 89   Temp 98.2 F (36.8 C) (Oral)   Resp 15   Wt 73.9 kg Comment: BED  LMP 11/17/2021 (Approximate)   SpO2 98%   BMI 22.10 kg/m   Intake/Output Summary (Last 24 hours) at 07/16/2023 1456 Last data filed at 07/16/2023 1211 Gross per 24 hour  Intake 120 ml  Output 800 ml  Net -680 ml    Middle-age woman in no distress Regular rate and rhythm Unlabored breathing 2+ right brachial and radial pulse     Latest Ref Rng & Units 07/16/2023    7:30 AM 07/15/2023    2:13 PM 07/15/2023   10:36 AM  CBC  WBC 4.0 - 10.5 K/uL 7.0  9.8    Hemoglobin 12.0 - 15.0 g/dL 9.3  95.2  84.1   Hematocrit 36.0 - 46.0 % 28.1  31.6  34.0   Platelets 150 - 400 K/uL 171  200          Latest Ref Rng & Units 07/16/2023    7:30 AM 07/15/2023    2:13 PM 07/15/2023   10:36 AM  CMP  Glucose 70 - 99 mg/dL 324  401  027   BUN 6 - 20 mg/dL 34  26  26   Creatinine 0.44 - 1.00 mg/dL 2.53  6.64  4.03   Sodium 135 - 145 mmol/L 133  135  137   Potassium 3.5 - 5.1 mmol/L 3.6  3.4  3.6   Chloride 98 - 111 mmol/L 98  97  98   CO2 22 - 32 mmol/L 25  25    Calcium 8.9 - 10.3 mg/dL 8.6  9.0    Total Protein 6.5 -  8.1 g/dL  7.3    Total Bilirubin <1.2 mg/dL  2.0    Alkaline Phos 38 - 126 U/L  62    AST 15 - 41 U/L  17    ALT 0 - 44 U/L  9      Estimated Creatinine Clearance: 10.7 mL/min (A) (by C-G formula based on SCr of 7.1 mg/dL (H)).  Rande Brunt. Lenell Antu, MD [facility] Phone Number: [phone] 07/16/2023 2:56 PM

## 2023-07-16 NOTE — Progress Notes (Signed)
Pt receives out-pt HD at [facility] on MWF. Will assist as needed.   Tracy Mounce Renal Navigator [phone] 

## 2023-07-16 NOTE — Progress Notes (Signed)
PROGRESS NOTE    [name]  UJW:[id number] DOB: 31-Aug-1970 DOA: 07/14/2023 PCP: Megan Officer, PA   Chief Complaint  Patient presents with   Hematemesis    Brief Narrative:   [name] is a 52 y.o. 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 Patient presents to ED with refractory n/v/ x 2-3 days with associated epigastric abdominal pain as well as episode of blood in emesis.    Assessment & Plan:   Principal Problem:   Intractable nausea and vomiting secondary to diabetic gastroparesis Active Problems:   Hypertension, uncontrolled   ESRD on dialysis (HCC)   Hypokalemia   Uncontrolled type 1 diabetes mellitus with hyperglycemia, with long-term current use of insulin (HCC)    Intractable nausea and vomiting secondary to diabetic gastroparesis Gastritis, esophagitis and gastric ulcer -Multifactorial, can be setting of the gastroparesis, as well can be related to her acute gastritis, reflux esophagitis with bleeding esophageal ulcer -On clear liquid diet -Continue with PPI, preferably twice daily for next few weeks then once daily, patient reports she was out of her PPI for last week.   Hypokalemia -On HD, continue to monitor   ESRD on dialysis [facility]) -nephrology consulted for HD. Pt has right internal jugular HD catheter. Pt scheduled for AVG on 07-17-2023.  Patient will still be hospitalized till tomorrow, vascular has been notified and plan for surgery during hospital stay   Hypertension, uncontrolled  restart isordil, coreg, hydralazine.   Uncontrolled type 1 diabetes mellitus with hyperglycemia, with long-term current use of insulin (HCC) 07-15-2023 pt with type 1 DM.  Had C-peptide in 12-2012 that was low at 0.15. will placed on SSI and low dose tresiba. Pt has reported allergy to lantus.   DVT  prophylaxis: SCD Code Status: Full Family Communication: none at bedside Disposition:   Status is: Inpatient    Consultants:  Vascular Renal GI  Subjective:  Patient reports he is feeling better today minor nausea, no vomiting, asking [place] to advance her diet  Objective: Vitals:   07/16/23 1130 07/16/23 1208 07/16/23 1211 07/16/23 1214  BP: 115/79 98/71 (!) 142/85   Pulse: 94 90 89   Resp: 19 12 15    Temp:  98.2 F (36.8 C)    TempSrc:  Oral    SpO2: 100% 99% 98%   Weight:    73.9 kg    Intake/Output Summary (Last 24 hours) at 07/16/2023 1325 Last data filed at 07/16/2023 1211 Gross per 24 hour  Intake 120 ml  Output 800 ml  Net -680 ml   Filed Weights   07/16/23 0746 07/16/23 1214  Weight: 73.6 kg 73.9 kg    Examination:  Awake Alert, Oriented X 3, No new F.N deficits, Normal affect Symmetrical Chest wall movement, Good air movement bilaterally, CTAB RRR,No Gallops,Rubs or new Murmurs, No Parasternal Heave +ve B.Sounds, Abd Soft, No tenderness, No rebound - guarding or rigidity. No Cyanosis, Clubbing or edema, No new Rash or bruise      Data Reviewed: I have personally reviewed following labs and imaging studies  CBC: Recent Labs  Lab 07/14/23 0645 07/14/23 0703 07/14/23 1811 07/15/23 0740 07/15/23 1036 07/15/23 1413 07/16/23 0730  WBC 8.4  --  8.0  --   --  9.8 7.0  NEUTROABS  --   --  5.9  --   --   --   --  HGB 11.2*   < > 11.1* 9.9* 11.6* 10.3* 9.3*  HCT 34.5*   < > 34.0* 29.9* 34.0* 31.6* 28.1*  MCV 91.0  --  89.9  --   --  90.5 90.4  PLT 241  --  249  --   --  200 171   < > = values in this interval not displayed.    Basic Metabolic Panel: Recent Labs  Lab 07/14/23 0645 07/14/23 0703 07/14/23 1811 07/15/23 1036 07/15/23 1413 07/16/23 0730  NA 137 137 141 137 135 133*  K 3.4* 3.5 3.7 3.6 3.4* 3.6  CL 99 105 101 98 97* 98  CO2 19*  --  23  --  25 25  GLUCOSE 261* 272* 181* 159* 161* 219*  BUN 49* 50* 56* 26* 26* 34*   CREATININE 7.69* 8.60* 8.35* 5.30* 5.50* 7.10*  CALCIUM 9.3  --  9.4  --  9.0 8.6*  MG 2.2  --   --   --   --   --   PHOS 5.5*  --   --   --   --  5.5*    GFR: Estimated Creatinine Clearance: 10.7 mL/min (A) (by C-G formula based on SCr of 7.1 mg/dL (H)).  Liver Function Tests: Recent Labs  Lab 07/14/23 0645 07/14/23 1811 07/15/23 1413 07/16/23 0730  AST 13* 14* 17  --   ALT 8 7 9   --   ALKPHOS 68 68 62  --   BILITOT 1.7* 1.6* 2.0*  --   PROT 8.0 7.5 7.3  --   ALBUMIN 4.0 3.8 3.7 3.4*    CBG: Recent Labs  Lab 07/15/23 0847 07/15/23 1452 07/15/23 1618 07/15/23 2110 07/16/23 1120  GLUCAP 154* 167* 167* 93 110*     No results found for this or any previous visit (from the past 240 hour(s)).       Radiology Studies: No results found.      Scheduled Meds:  carvedilol  3.125 mg Oral BID   Chlorhexidine Gluconate Cloth  6 each Topical Q0600   darbepoetin (ARANESP) injection - DIALYSIS  40 mcg Subcutaneous Q Wed-1800   hydrALAZINE  50 mg Oral TID   insulin aspart  0-5 Units Subcutaneous QHS   insulin aspart  0-9 Units Subcutaneous TID WC   insulin glargine-yfgn  10 Units Subcutaneous QHS   isosorbide dinitrate  20 mg Oral TID   pantoprazole (PROTONIX) IV  40 mg Intravenous Q8H   Followed by   Melene Muller ON 07/17/2023] pantoprazole (PROTONIX) IV  40 mg Intravenous Q12H   Continuous Infusions:  promethazine (PHENERGAN) injection (IM or IVPB) Stopped (07/15/23 0713)     LOS: 2 days    Huey Bienenstock, MD [facility]   To contact the attending provider between 7A-7P or the covering provider during after hours 7P-7A, please log into the web site [email or web address] and access using universal [place] password for that web site. If you do not have the password, please call the hospital operator.  07/16/2023, 1:25 PM

## 2023-07-17 ENCOUNTER — Other Ambulatory Visit: Payer: Self-pay

## 2023-07-17 ENCOUNTER — Inpatient Hospital Stay (HOSPITAL_COMMUNITY): Payer: Self-pay | Admitting: Certified Registered"

## 2023-07-17 ENCOUNTER — Encounter (HOSPITAL_COMMUNITY)
Admission: EM | Disposition: A | Discharge status: Home / Self Care | Payer: Self-pay | Source: Home / Self Care | Attending: Internal Medicine

## 2023-07-17 ENCOUNTER — Encounter (HOSPITAL_COMMUNITY): Payer: Self-pay | Admitting: Internal Medicine

## 2023-07-17 ENCOUNTER — Ambulatory Visit (HOSPITAL_COMMUNITY)
Admission: RE | Admit: 2023-07-17 | Payer: BC Managed Care – PPO | Source: Home / Self Care | Admitting: Vascular Surgery

## 2023-07-17 DIAGNOSIS — Z992 Dependence on renal dialysis: Secondary | ICD-10-CM | POA: Diagnosis not present

## 2023-07-17 DIAGNOSIS — E1065 Type 1 diabetes mellitus with hyperglycemia: Secondary | ICD-10-CM | POA: Diagnosis not present

## 2023-07-17 DIAGNOSIS — R112 Nausea with vomiting, unspecified: Secondary | ICD-10-CM | POA: Diagnosis not present

## 2023-07-17 DIAGNOSIS — N186 End stage renal disease: Secondary | ICD-10-CM | POA: Diagnosis not present

## 2023-07-17 DIAGNOSIS — N185 Chronic kidney disease, stage 5: Secondary | ICD-10-CM | POA: Diagnosis not present

## 2023-07-17 HISTORY — PX: AV FISTULA PLACEMENT: SHX1204

## 2023-07-17 LAB — RENAL FUNCTION PANEL
Albumin: 3.4 g/dL — ABNORMAL LOW (ref 3.5–5.0)
Anion gap: 11 (ref 5–15)
BUN: 25 mg/dL — ABNORMAL HIGH (ref 6–20)
CO2: 26 mmol/L (ref 22–32)
Calcium: 8.5 mg/dL — ABNORMAL LOW (ref 8.9–10.3)
Chloride: 98 mmol/L (ref 98–111)
Creatinine, Ser: 5.51 mg/dL — ABNORMAL HIGH (ref 0.44–1.00)
GFR, Estimated: 9 mL/min — ABNORMAL LOW (ref >60)
Glucose, Bld: 191 mg/dL — ABNORMAL HIGH (ref 70–99)
Phosphorus: 5.1 mg/dL — ABNORMAL HIGH (ref 2.5–4.6)
Potassium: 3.5 mmol/L (ref 3.5–5.1)
Sodium: 135 mmol/L (ref 135–145)

## 2023-07-17 LAB — GLUCOSE, CAPILLARY
Glucose-Capillary: 216 mg/dL — ABNORMAL HIGH (ref 70–99)
Glucose-Capillary: 217 mg/dL — ABNORMAL HIGH (ref 70–99)
Glucose-Capillary: 194 mg/dL — ABNORMAL HIGH (ref 70–99)
Glucose-Capillary: 127 mg/dL — ABNORMAL HIGH (ref 70–99)
Glucose-Capillary: 96 mg/dL (ref 70–99)
Glucose-Capillary: 357 mg/dL — ABNORMAL HIGH (ref 70–99)

## 2023-07-17 LAB — CBC
HCT: 28.7 % — ABNORMAL LOW (ref 36.0–46.0)
Hemoglobin: 9.5 g/dL — ABNORMAL LOW (ref 12.0–15.0)
MCH: 29.4 pg (ref 26.0–34.0)
MCHC: 33.1 g/dL (ref 30.0–36.0)
MCV: 88.9 fL (ref 80.0–100.0)
Platelets: 159 10*3/uL (ref 150–400)
RBC: 3.23 MIL/uL — ABNORMAL LOW (ref 3.87–5.11)
RDW: 13.8 % (ref 11.5–15.5)
WBC: 7.5 10*3/uL (ref 4.0–10.5)
nRBC: 0 % (ref 0.0–0.2)

## 2023-07-17 SURGERY — INSERTION OF ARTERIOVENOUS (AV) GORE-TEX GRAFT ARM
Anesthesia: Monitor Anesthesia Care | Site: Arm Upper | Laterality: Right

## 2023-07-17 MED ORDER — 0.9 % SODIUM CHLORIDE (POUR BTL) OPTIME
TOPICAL | Status: DC | PRN
  Administered 2023-07-17: 1000 mL

## 2023-07-17 MED ORDER — BUPIVACAINE HCL (PF) 0.5 % IJ SOLN
INTRAMUSCULAR | Status: DC | PRN
  Administered 2023-07-17: 30 mL via PERINEURAL

## 2023-07-17 MED ORDER — SODIUM CHLORIDE 0.9 % IV BOLUS
250.0000 mL | Freq: Once | INTRAVENOUS | Status: AC
  Administered 2023-07-17: 250 mL via INTRAVENOUS

## 2023-07-17 MED ORDER — FENTANYL CITRATE (PF) 250 MCG/5ML IJ SOLN
INTRAMUSCULAR | Status: AC
  Filled 2023-07-17: qty 5

## 2023-07-17 MED ORDER — ACETAMINOPHEN 325 MG PO TABS
650.0000 mg | ORAL_TABLET | ORAL | Status: DC | PRN
  Administered 2023-07-17: 650 mg via ORAL
  Filled 2023-07-17: qty 2

## 2023-07-17 MED ORDER — INSULIN ASPART 100 UNIT/ML IJ SOLN
0.0000 [IU] | INTRAMUSCULAR | Status: DC | PRN
  Administered 2023-07-17: 2 [IU] via SUBCUTANEOUS

## 2023-07-17 MED ORDER — DARBEPOETIN ALFA 40 MCG/0.4ML IJ SOSY
40.0000 ug | PREFILLED_SYRINGE | INTRAMUSCULAR | Status: DC
  Administered 2023-07-18: 40 ug via SUBCUTANEOUS
  Filled 2023-07-17: qty 0.4

## 2023-07-17 MED ORDER — HEPARIN 6000 UNIT IRRIGATION SOLUTION
Status: DC | PRN
  Administered 2023-07-17: 1

## 2023-07-17 MED ORDER — ORAL CARE MOUTH RINSE: 15.0000 mL | Freq: Once | OROMUCOSAL | Status: AC

## 2023-07-17 MED ORDER — ACETAMINOPHEN 10 MG/ML IV SOLN
1000.0000 mg | Freq: Once | INTRAVENOUS | Status: AC
  Administered 2023-07-17: 1000 mg via INTRAVENOUS

## 2023-07-17 MED ORDER — CEFAZOLIN SODIUM-DEXTROSE 2-3 GM-%(50ML) IV SOLR
INTRAVENOUS | Status: DC | PRN
  Administered 2023-07-17: 2 g via INTRAVENOUS

## 2023-07-17 MED ORDER — ACETAMINOPHEN 10 MG/ML IV SOLN
INTRAVENOUS | Status: AC
  Filled 2023-07-17: qty 100

## 2023-07-17 MED ORDER — CEFAZOLIN SODIUM 1 G IJ SOLR
INTRAMUSCULAR | Status: AC
  Filled 2023-07-17: qty 20

## 2023-07-17 MED ORDER — STERILE WATER FOR IRRIGATION IR SOLN
Status: DC | PRN
  Administered 2023-07-17: 1000 mL

## 2023-07-17 MED ORDER — AMISULPRIDE (ANTIEMETIC) 5 MG/2ML IV SOLN
INTRAVENOUS | Status: AC
  Filled 2023-07-17: qty 4

## 2023-07-17 MED ORDER — SODIUM CHLORIDE 0.9 % IV SOLN: INTRAVENOUS | Status: DC | PRN

## 2023-07-17 MED ORDER — PHENYLEPHRINE 80 MCG/ML (10ML) SYRINGE FOR IV PUSH (FOR BLOOD PRESSURE SUPPORT)
PREFILLED_SYRINGE | INTRAVENOUS | Status: DC | PRN
  Administered 2023-07-17: 160 ug via INTRAVENOUS

## 2023-07-17 MED ORDER — CHLORHEXIDINE GLUCONATE 0.12 % MT SOLN
15.0000 mL | Freq: Once | OROMUCOSAL | Status: AC
Start: 2023-07-17 — End: 2023-07-17
  Administered 2023-07-17: 15 mL via OROMUCOSAL
  Filled 2023-07-17: qty 15

## 2023-07-17 MED ORDER — FENTANYL CITRATE (PF) 250 MCG/5ML IJ SOLN
INTRAMUSCULAR | Status: DC | PRN
  Administered 2023-07-17 (×2): 50 ug via INTRAVENOUS

## 2023-07-17 MED ORDER — PROPOFOL 500 MG/50ML IV EMUL
INTRAVENOUS | Status: DC | PRN
  Administered 2023-07-17: 20 mg via INTRAVENOUS
  Administered 2023-07-17: 100 ug/kg/min via INTRAVENOUS

## 2023-07-17 MED ORDER — HEPARIN 6000 UNIT IRRIGATION SOLUTION
Status: AC
  Filled 2023-07-17: qty 500

## 2023-07-17 MED ORDER — AMISULPRIDE (ANTIEMETIC) 5 MG/2ML IV SOLN
10.0000 mg | Freq: Once | INTRAVENOUS | Status: AC
Start: 2023-07-17 — End: 2023-07-17
  Administered 2023-07-17: 10 mg via INTRAVENOUS

## 2023-07-17 MED ORDER — ONDANSETRON HCL 4 MG/2ML IJ SOLN: 4.0000 mg | Freq: Once | INTRAMUSCULAR | Status: DC

## 2023-07-17 MED ORDER — MIDAZOLAM HCL 2 MG/2ML IJ SOLN
INTRAMUSCULAR | Status: AC
  Filled 2023-07-17: qty 2

## 2023-07-17 MED ORDER — FENTANYL CITRATE (PF) 100 MCG/2ML IJ SOLN: 25.0000 ug | INTRAMUSCULAR | Status: DC | PRN

## 2023-07-17 MED ORDER — CHLORHEXIDINE GLUCONATE CLOTH 2 % EX PADS: 6.0000 | MEDICATED_PAD | Freq: Every day | CUTANEOUS | Status: DC

## 2023-07-17 MED ORDER — HYDROCODONE-ACETAMINOPHEN 5-325 MG PO TABS
1.0000 | ORAL_TABLET | ORAL | Status: DC | PRN
  Administered 2023-07-17: 2 via ORAL
  Filled 2023-07-17: qty 2

## 2023-07-17 MED ORDER — MIDAZOLAM HCL 2 MG/2ML IJ SOLN
INTRAMUSCULAR | Status: DC | PRN
  Administered 2023-07-17 (×2): 1 mg via INTRAVENOUS

## 2023-07-17 SURGICAL SUPPLY — 32 items
ARMBAND PINK RESTRICT EXTREMIT (MISCELLANEOUS) ×2 IMPLANT
BENZOIN TINCTURE PRP APPL 2/3 (GAUZE/BANDAGES/DRESSINGS) ×2 IMPLANT
CANISTER SUCT 3000ML PPV (MISCELLANEOUS) ×2 IMPLANT
CANNULA VESSEL 3MM 2 BLNT TIP (CANNULA) ×2 IMPLANT
CHLORAPREP W/TINT 26 (MISCELLANEOUS) ×2 IMPLANT
CLIP LIGATING EXTRA MED SLVR (CLIP) ×2 IMPLANT
CLIP LIGATING EXTRA SM BLUE (MISCELLANEOUS) ×2 IMPLANT
CLSR STERI-STRIP ANTIMIC 1/2X4 (GAUZE/BANDAGES/DRESSINGS) IMPLANT
DRSG IV TEGADERM 3.5X4.5 STRL (GAUZE/BANDAGES/DRESSINGS) IMPLANT
ELECT REM PT RETURN 9FT ADLT (ELECTROSURGICAL) ×1
ELECTRODE REM PT RTRN 9FT ADLT (ELECTROSURGICAL) ×2 IMPLANT
GLOVE BIO SURGEON STRL SZ8 (GLOVE) ×2 IMPLANT
GOWN STRL REUS W/ TWL LRG LVL3 (GOWN DISPOSABLE) ×4 IMPLANT
GOWN STRL REUS W/ TWL XL LVL3 (GOWN DISPOSABLE) ×2 IMPLANT
GRAFT GORETEX STRT 4-7X45 (Vascular Products) IMPLANT
KIT BASIN OR (CUSTOM PROCEDURE TRAY) ×2 IMPLANT
KIT TURNOVER KIT B (KITS) ×2 IMPLANT
LOOP VESSEL MINI RED (MISCELLANEOUS) IMPLANT
NDL 18GX1X1/2 (RX/OR ONLY) (NEEDLE) IMPLANT
NEEDLE 18GX1X1/2 (RX/OR ONLY) (NEEDLE) IMPLANT
NS IRRIG 1000ML POUR BTL (IV SOLUTION) ×2 IMPLANT
PACK CV ACCESS (CUSTOM PROCEDURE TRAY) ×2 IMPLANT
PAD ARMBOARD 7.5X6 YLW CONV (MISCELLANEOUS) ×4 IMPLANT
SHEATH PROBE COVER 6X72 (BAG) ×2 IMPLANT
SLING ARM FOAM STRAP MED (SOFTGOODS) IMPLANT
STRIP CLOSURE SKIN 1/2X4 (GAUZE/BANDAGES/DRESSINGS) ×2 IMPLANT
SUT MNCRL AB 4-0 PS2 18 (SUTURE) IMPLANT
SUT PROLENE 6 0 BV (SUTURE) IMPLANT
SUT VIC AB 3-0 SH 27X BRD (SUTURE) ×4 IMPLANT
TOWEL GREEN STERILE (TOWEL DISPOSABLE) ×2 IMPLANT
UNDERPAD 30X36 HEAVY ABSORB (UNDERPADS AND DIAPERS) ×2 IMPLANT
WATER STERILE IRR 1000ML POUR (IV SOLUTION) ×2 IMPLANT

## 2023-07-17 NOTE — Discharge Instructions (Signed)

## 2023-07-17 NOTE — Progress Notes (Signed)
PROGRESS NOTE    [name]  OZH:[id number] DOB: [birth year] DOA: 07/14/2023 PCP: Delma Officer, PA   Chief Complaint  Patient presents with   Hematemesis    Brief Narrative:   [name] is a 52 y.o. 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 Patient presents to ED with refractory n/v/ x 2-3 days with associated epigastric abdominal pain as well as episode of blood in emesis.    Assessment & Plan:   Principal Problem:   Intractable nausea and vomiting secondary to diabetic gastroparesis Active Problems:   Hypertension, uncontrolled   ESRD on dialysis (HCC)   Hypokalemia   Uncontrolled type 1 diabetes mellitus with hyperglycemia, with long-term current use of insulin (HCC)    Intractable nausea and vomiting secondary to diabetic gastroparesis/dumping syndrome Gastritis, esophagitis and gastric ulcer -Multifactorial, can be setting of the gastroparesis, with known dumping syndrome, as well can be related to her acute gastritis, reflux esophagitis with bleeding esophageal ulcer -Continue to hold aspirin -On clear liquid diet, advance to soft diet -Continue with PPI, preferably twice daily for next few weeks then once daily, patient reports she was out of her PPI for last week.  No further recommendations as discussed with Dr. Bosie Clos -With prolonged history of gastroparesis-dumping syndrome, she is being followed by Dr. Onalee Hua at [facility]  Hypokalemia -On HD, continue to monitor   ESRD on dialysis [facility]) -nephrology consulted for HD.  - Pt has right internal jugular HD catheter.  Status post right upper arm AV graft by Dr. Criss Alvine 12/12    Hypertension, uncontrolled Nonischemic cardiomyopathy CAD -Will continue to hold aspirin due to bleeding esophageal ulcer -Recent 2D echo showing drop in EF 35% -That  is post AICD - restart isordil, coreg, hydralazine. -Blood pressure postop today is soft, will give fluid bolus.   Uncontrolled type 1 diabetes mellitus with hyperglycemia, with long-term current use of insulin (HCC) 07-15-2023 pt with type 1 DM.  Had C-peptide in 12-2012 that was low at 0.15. will placed on SSI and low dose tresiba. Pt has reported allergy to lantus.   DVT prophylaxis: SCD Code Status: Full Family Communication: none at bedside Disposition:   Status is: Inpatient    Consultants:  Vascular Renal GI  Subjective:  Patient is nauseous, lightheaded, with soft blood pressure post OR today, received Compazine and IV fluid bolus.    Objective: Vitals:   07/17/23 1000 07/17/23 1059 07/17/23 1100 07/17/23 1201  BP: (!) 164/95     Pulse: 86 84    Resp: 20     Temp:   98 F (36.7 C) 98 F (36.7 C)  TempSrc:  Oral Oral Oral  SpO2: 96%     Weight:      Height:        Intake/Output Summary (Last 24 hours) at 07/17/2023 1206 Last data filed at 07/17/2023 0910 Gross per 24 hour  Intake 450 ml  Output 1105 ml  Net -655 ml   Filed Weights   07/16/23 1214 07/17/23 0300 07/17/23 0656  Weight: 73.9 kg 77 kg 73.5 kg    Examination:  Awake Alert, Oriented X 3, uncomfortable post op  Symmetrical Chest wall movement, Good air movement bilaterally, CTAB RRR,No Gallops,Rubs or new Murmurs, No Parasternal Heave +ve B.Sounds, Abd Soft, No tenderness, No rebound - guarding or rigidity. No Cyanosis, Clubbing or edema, No new Rash  or bruise       Data Reviewed: I have personally reviewed following labs and imaging studies  CBC: Recent Labs  Lab 07/14/23 0645 07/14/23 0703 07/14/23 1811 07/15/23 0740 07/15/23 1036 07/15/23 1413 07/16/23 0730 07/17/23 0457  WBC 8.4  --  8.0  --   --  9.8 7.0 7.5  NEUTROABS  --   --  5.9  --   --   --   --   --   HGB 11.2*   < > 11.1* 9.9* 11.6* 10.3* 9.3* 9.5*  HCT 34.5*   < > 34.0* 29.9* 34.0* 31.6* 28.1* 28.7*  MCV 91.0   --  89.9  --   --  90.5 90.4 88.9  PLT 241  --  249  --   --  200 171 159   < > = values in this interval not displayed.    Basic Metabolic Panel: Recent Labs  Lab 07/14/23 0645 07/14/23 0703 07/14/23 1811 07/15/23 1036 07/15/23 1413 07/16/23 0730 07/17/23 0457  NA 137   < > 141 137 135 133* 135  K 3.4*   < > 3.7 3.6 3.4* 3.6 3.5  CL 99   < > 101 98 97* 98 98  CO2 19*  --  23  --  25 25 26   GLUCOSE 261*   < > 181* 159* 161* 219* 191*  BUN 49*   < > 56* 26* 26* 34* 25*  CREATININE 7.69*   < > 8.35* 5.30* 5.50* 7.10* 5.51*  CALCIUM 9.3  --  9.4  --  9.0 8.6* 8.5*  MG 2.2  --   --   --   --   --   --   PHOS 5.5*  --   --   --   --  5.5* 5.1*   < > = values in this interval not displayed.    GFR: Estimated Creatinine Clearance: 13.8 mL/min (A) (by C-G formula based on SCr of 5.51 mg/dL (H)).  Liver Function Tests: Recent Labs  Lab 07/14/23 0645 07/14/23 1811 07/15/23 1413 07/16/23 0730 07/17/23 0457  AST 13* 14* 17  --   --   ALT 8 7 9   --   --   ALKPHOS 68 68 62  --   --   BILITOT 1.7* 1.6* 2.0*  --   --   PROT 8.0 7.5 7.3  --   --   ALBUMIN 4.0 3.8 3.7 3.4* 3.4*    CBG: Recent Labs  Lab 07/16/23 2309 07/17/23 0551 07/17/23 0720 07/17/23 0920 07/17/23 1157  GLUCAP 288* 216* 217* 194* 127*     No results found for this or any previous visit (from the past 240 hours).       Radiology Studies: No results found.      Scheduled Meds:  amisulpride       carvedilol  3.125 mg Oral BID   Chlorhexidine Gluconate Cloth  6 each Topical Q0600   darbepoetin (ARANESP) injection - DIALYSIS  40 mcg Subcutaneous Q Wed-1800   hydrALAZINE  50 mg Oral TID   insulin aspart  0-5 Units Subcutaneous QHS   insulin aspart  0-9 Units Subcutaneous TID WC   insulin glargine-yfgn  10 Units Subcutaneous QHS   isosorbide dinitrate  20 mg Oral TID   pantoprazole (PROTONIX) IV  40 mg Intravenous Q8H   Followed by   pantoprazole (PROTONIX) IV  40 mg Intravenous Q12H    Continuous Infusions:  acetaminophen  cefUROXime (ZINACEF)  IV     promethazine (PHENERGAN) injection (IM or IVPB) Stopped (07/15/23 0713)   sodium chloride       LOS: 3 days    Huey Bienenstock, MD [facility]   To contact the attending provider between 7A-7P or the covering provider during after hours 7P-7A, please log into the web site [email or web address] and access using universal [place] password for that web site. If you do not have the password, please call the hospital operator.  07/17/2023, 12:06 PM

## 2023-07-17 NOTE — Anesthesia Postprocedure Evaluation (Signed)
Anesthesia Post Note  Patient: Andrya Kusnierz  Procedure(s) Performed: INSERTION OF ARTERIOVENOUS (AV) GOR-TEX GRAFT RIGHT ARM (Right: Arm Upper)     Patient location during evaluation: PACU Anesthesia Type: Regional Level of consciousness: awake and alert Pain management: pain level controlled Vital Signs Assessment: post-procedure vital signs reviewed and stable Respiratory status: spontaneous breathing, nonlabored ventilation, respiratory function stable and patient connected to nasal cannula oxygen Cardiovascular status: stable and blood pressure returned to baseline Postop Assessment: no apparent nausea or vomiting Anesthetic complications: no   No notable events documented.  Last Vitals:  Vitals:   07/17/23 1100 07/17/23 1201  BP:    Pulse:    Resp:    Temp: 36.7 C 36.7 C  SpO2:      Last Pain:  Vitals:   07/17/23 1201  TempSrc: Oral  PainSc:                  Trevor Iha

## 2023-07-17 NOTE — Progress Notes (Signed)
[facility] Progress Note   Subjective:   Pt had AVG placed today. Reports she has been nauseated all day again, started prior to surgery. Denies SOB, CP, dizziness.   Objective Vitals:   07/17/23 1000 07/17/23 1059 07/17/23 1100 07/17/23 1201  BP: (!) 164/95     Pulse: 86 84  91  Resp: 20     Temp:   98 F (36.7 C) 98 F (36.7 C)  TempSrc:  Oral Oral Oral  SpO2: 96%     Weight:      Height:       Physical Exam General: Alert female in NAD Heart: RRR, no murmurs, rubs or gallops Lungs: CTA bilaterally, respirations unlabored Abdomen: Soft, non-distended, +BS Extremities: No edema b/l lower extremities Dialysis Access:  [facility], new AVG + bruit  Additional Objective Labs: Basic Metabolic Panel: Recent Labs  Lab 07/14/23 0645 07/14/23 0703 07/15/23 1413 07/16/23 0730 07/17/23 0457  NA 137   < > 135 133* 135  K 3.4*   < > 3.4* 3.6 3.5  CL 99   < > 97* 98 98  CO2 19*   < > 25 25 26   GLUCOSE 261*   < > 161* 219* 191*  BUN 49*   < > 26* 34* 25*  CREATININE 7.69*   < > 5.50* 7.10* 5.51*  CALCIUM 9.3   < > 9.0 8.6* 8.5*  PHOS 5.5*  --   --  5.5* 5.1*   < > = values in this interval not displayed.   Liver Function Tests: Recent Labs  Lab 07/14/23 0645 07/14/23 1811 07/15/23 1413 07/16/23 0730 07/17/23 0457  AST 13* 14* 17  --   --   ALT 8 7 9   --   --   ALKPHOS 68 68 62  --   --   BILITOT 1.7* 1.6* 2.0*  --   --   PROT 8.0 7.5 7.3  --   --   ALBUMIN 4.0 3.8 3.7 3.4* 3.4*   Recent Labs  Lab 07/14/23 0645  LIPASE 23   CBC: Recent Labs  Lab 07/14/23 0645 07/14/23 0703 07/14/23 1811 07/15/23 0740 07/15/23 1413 07/16/23 0730 07/17/23 0457  WBC 8.4  --  8.0  --  9.8 7.0 7.5  NEUTROABS  --   --  5.9  --   --   --   --   HGB 11.2*   < > 11.1*   < > 10.3* 9.3* 9.5*  HCT 34.5*   < > 34.0*   < > 31.6* 28.1* 28.7*  MCV 91.0  --  89.9  --  90.5 90.4 88.9  PLT 241  --  249  --  200 171 159   < > = values in this interval not displayed.    Blood Culture    Component Value Date/Time   SDES BLOOD RIGHT ANTECUBITAL 10/21/2022 1856   SPECREQUEST  10/21/2022 1856    BOTTLES DRAWN AEROBIC AND ANAEROBIC Blood Culture results may not be optimal due to an inadequate volume of blood received in culture bottles   CULT  10/21/2022 1856    NO GROWTH 5 DAYS Performed at [facility], [address] [place], [place] [address]    REPTSTATUS 10/26/2022 FINAL 10/21/2022 1856    Cardiac Enzymes: No results for input(s): "CKTOTAL", "CKMB", "CKMBINDEX", "TROPONINI" in the last 168 hours. CBG: Recent Labs  Lab 07/16/23 2309 07/17/23 0551 07/17/23 0720 07/17/23 0920 07/17/23 1157  GLUCAP 288* 216* 217* 194*  127*   Iron Studies: No results for input(s): "IRON", "TIBC", "TRANSFERRIN", "FERRITIN" in the last 72 hours. @lablastinr3 @ Studies/Results: No results found. Medications:  acetaminophen     cefUROXime (ZINACEF)  IV     promethazine (PHENERGAN) injection (IM or IVPB) 12.5 mg (07/17/23 1323)    amisulpride       carvedilol  3.125 mg Oral BID   Chlorhexidine Gluconate Cloth  6 each Topical Q0600   darbepoetin (ARANESP) injection - DIALYSIS  40 mcg Subcutaneous Q Thu-1800   hydrALAZINE  50 mg Oral TID   insulin aspart  0-5 Units Subcutaneous QHS   insulin aspart  0-9 Units Subcutaneous TID WC   insulin glargine-yfgn  10 Units Subcutaneous QHS   isosorbide dinitrate  20 mg Oral TID   pantoprazole (PROTONIX) IV  40 mg Intravenous Q8H   Followed by   pantoprazole (PROTONIX) IV  40 mg Intravenous Q12H    Dialysis Orders:  Center: [facility]  on MWF. 180Nre 4 hours BFR 400 DFR Auto 1.5 3K 2.5Ca EDW 74.9kg Heparin 3000 unit bolus and 2000 unit bolus mid run Mircera IV q 4 weeks  Venofer 50mg  IV weekly  Calcitriol 0.3mcg Po q HD  Assessment/Plan: Refractory nausea: Likely acute exacerbation of diabetic gastroparesis. S/p EGD . Per GI.  ESRD:  Tolerating well. Continue MWF schedule   Hypertension/volume: Was unable to keep BP meds down. BP now improved. Euvolemic on exam, will get standing weights when she is able.   Anemia: Hgb 9.5, likely needs higher ESA dose with recent GI bleed. Started aranesp  Metabolic bone disease: Calcium controlled. Continue VDRA once tolerating PO meds. Phos at goal, resume binders when eating  Nutrition:  Currently on clear liquids Dialysis access: AVG was already scheduled outpatient, appreciate VVS assistance. New AVG placed 07/17/23    Rogers Blocker, PA-C 07/17/2023, 3:01 PM  [facility] Pager: [phone]

## 2023-07-17 NOTE — Transfer of Care (Signed)
Immediate Anesthesia Transfer of Care Note  Patient: [name]  Procedure(s) Performed: INSERTION OF ARTERIOVENOUS (AV) GOR-TEX GRAFT RIGHT ARM (Right: Arm Upper)  Patient Location: PACU  Anesthesia Type:MAC and Regional  Level of Consciousness: drowsy and patient cooperative  Airway & Oxygen Therapy: Patient Spontanous Breathing  Post-op Assessment: Report given to RN and Post -op Vital signs reviewed and stable  Post vital signs: Reviewed and stable  Last Vitals:  Vitals Value Taken Time  BP 149/93 07/17/23 0913  Temp    Pulse 99 07/17/23 0915  Resp 24 07/17/23 0915  SpO2 96 % 07/17/23 0915  Vitals shown include unfiled device data.  Last Pain:  Vitals:   07/17/23 0715  TempSrc:   PainSc: 0-No pain         Complications: No notable events documented.

## 2023-07-17 NOTE — Interval H&P Note (Signed)
History and Physical Interval Note:  07/17/2023 7:27 AM  [name]  has presented today for surgery, with the diagnosis of End Stage Renal Disease.  The various methods of treatment have been discussed with the patient and family. After consideration of risks, benefits and other options for treatment, the patient has consented to  Procedure(s): INSERTION OF ARTERIOVENOUS (AV) GOR-TEX GRAFT RIGHT ARM (Right) as a surgical intervention.  The patient's history has been reviewed, patient examined, no change in status, stable for surgery.  I have reviewed the patient's chart and labs.  Questions were answered to the patient's satisfaction.     Leonie Douglas

## 2023-07-17 NOTE — Op Note (Signed)
DATE OF SERVICE: 07/17/2023  PATIENT:  [name]  52 y.o. female  PRE-OPERATIVE DIAGNOSIS:  ESRD  POST-OPERATIVE DIAGNOSIS:  Same  PROCEDURE:   Right upper arm arteriovenous graft  SURGEON:  Surgeons and Role:    * Leonie Douglas, MD - Primary  ASSISTANT: Aggie Moats, PA-C  An experienced assistant was required given the complexity of this procedure and the standard of surgical care. My assistant helped with exposure through counter tension, suctioning, ligation and retraction to better visualize the surgical field.  My assistant expedited sewing during the case by following my sutures. Wherever I use the term "we" in the report, my assistant actively helped me with that portion of the procedure.  ANESTHESIA:   regional and MAC  EBL: minimal  BLOOD ADMINISTERED:none  DRAINS: none   LOCAL MEDICATIONS USED:  NONE  SPECIMEN:  none  COUNTS: confirmed correct.  TOURNIQUET: none  PATIENT DISPOSITION:  PACU - hemodynamically stable.   Delay start of Pharmacological VTE agent (>24hrs) due to surgical blood loss or risk of bleeding: no  INDICATION FOR PROCEDURE: [name] is a 52 y.o. female with ESRD in need of permanent dialysis access. After careful discussion of risks, benefits, and alternatives the patient was offered right arm graft. The patient understood and wished to proceed.  OPERATIVE FINDINGS: healthy brachial artery. Healthy brachial vein. Good result from graft. Brisk doppler flow in wrist at completion. Brisk doppler bruit at completion.  DESCRIPTION OF PROCEDURE: After identification of the patient in the pre-operative holding area, the patient was transferred to the operating room. The patient was positioned supine on the operating room table. Anesthesia was induced. The right arm was prepped and draped in standard fashion. A surgical pause was performed confirming correct patient, procedure, and operative location.  The right brachial artery was  exposed using a longitudinal incision in the distal arm just above the antecubital fossa.  Incision was carried down through subcutaneous tissue until the brachial sheath was encountered.  This was incised sharply.  The brachial artery was exposed and encircled with Silastic Vesseloops proximally and distally to the site of planned inflow.   The right brachial vein at the axilla was exposed using longitudinal incision just below the hairbearing area of the axilla.  Incision was carried down until the brachial sheath was encountered.  The brachial vein was identified, exposed, encircled with Silastic Vesseloops.   Using a curved, sheathed tunneling device, a 4-7 mm tapered Gore-Tex graft was tunneled subcutaneously and gentle arc across the biceps of the right arm.  Patient was then heparinized with 5000 units of IV heparin.   The brachial artery was clamped proximally and distally.  An anterior arteriotomy was made with an 11 blade.  This was extended with Potts scissors.  The 4 mm end of the Gore-Tex graft was spatulated and then anastomosed end-to-side to the brachial arteriotomy using continuous running suture of 6-0 Prolene.  The anastomosis was completed and hemostasis ensured.  The graft was clamped to restore perfusion to the hand.   The brachial vein was clamped proximally and distally.  An anterior venotomy was made with an 11 blade.  This was extended with Potts scissors.  The 7 mm end of the Gore-Tex graft was then anastomosed end to side to the brachial vein venotomy using continuous running suture of 6-0 Prolene.  Immediately prior to completion the anastomosis was de-aired and flushed.  Anastomosis was then completed.  Hemostasis was insured.   Doppler machine was brought onto  the field to interrogate the graft.  Doppler flow was noted in the radial artery.  About the arterial anastomosis flow was noted proximal and distal to the arterial anastomosis.  Distal to the venous anastomosis a  Doppler bruit was heard.  Satisfied we ended the case here.   Surgical beds were irrigated copiously.  Hemostasis was again ensured in the surgical beds.  The wounds were closed in layers using 3-0 Vicryl and 4-0 Monocryl.  Clean bandages were applied.  Upon completion of the case instrument and sharps counts were confirmed correct. The patient was transferred to the  PACU in good condition. I was present for all portions of the procedure.  FOLLOW UP PLAN: patient can begin cannulating graft in 4 weeks. Follow up with me for any problems with the graft as needed.  Rande Brunt. Lenell Antu, MD [facility] Phone Number: [phone] 07/17/2023 9:13 AM

## 2023-07-17 NOTE — Plan of Care (Signed)
  Problem: Health Behavior/Discharge Planning: Goal: Ability to manage health-related needs will improve Outcome: Progressing   Problem: Clinical Measurements: Goal: Ability to maintain clinical measurements within normal limits will improve Outcome: Progressing   Problem: Pain Management: Goal: General experience of comfort will improve Outcome: Progressing   Problem: Fluid Volume: Goal: Ability to maintain a balanced intake and output will improve Outcome: Progressing

## 2023-07-17 NOTE — Anesthesia Procedure Notes (Signed)
Anesthesia Regional Block: Interscalene brachial plexus block   Pre-Anesthetic Checklist: , timeout performed,  Correct Patient, Correct Site, Correct Laterality,  Correct Procedure, Correct Position, site marked,  Risks and benefits discussed,  Surgical consent,  Pre-op evaluation,  At surgeon's request and post-op pain management  Laterality: Upper and Right  Prep: Maximum Sterile Barrier Precautions used, chloraprep       Needles:  Injection technique: Single-shot  Needle Type: Echogenic Needle     Needle Length: 5cm  Needle Gauge: 21     Additional Needles:   Procedures:,,,, ultrasound used (permanent image in chart),,    Narrative:  Start time: 07/17/2023 7:24 AM End time: 07/17/2023 7:32 AM Injection made incrementally with aspirations every 5 mL.  Performed by: Personally  Anesthesiologist: Trevor Iha, MD  Additional Notes: Block assessed prior to procedure. Patient tolerated procedure well.

## 2023-07-18 ENCOUNTER — Encounter (HOSPITAL_COMMUNITY): Payer: Self-pay

## 2023-07-18 ENCOUNTER — Encounter (HOSPITAL_COMMUNITY): Payer: Self-pay | Admitting: Gastroenterology

## 2023-07-18 ENCOUNTER — Other Ambulatory Visit (HOSPITAL_COMMUNITY): Payer: Self-pay

## 2023-07-18 DIAGNOSIS — N186 End stage renal disease: Secondary | ICD-10-CM | POA: Diagnosis not present

## 2023-07-18 DIAGNOSIS — R112 Nausea with vomiting, unspecified: Secondary | ICD-10-CM | POA: Diagnosis not present

## 2023-07-18 DIAGNOSIS — Z992 Dependence on renal dialysis: Secondary | ICD-10-CM | POA: Diagnosis not present

## 2023-07-18 LAB — BASIC METABOLIC PANEL
Anion gap: 12 (ref 5–15)
BUN: 40 mg/dL — ABNORMAL HIGH (ref 6–20)
CO2: 23 mmol/L (ref 22–32)
Calcium: 8.6 mg/dL — ABNORMAL LOW (ref 8.9–10.3)
Chloride: 100 mmol/L (ref 98–111)
Creatinine, Ser: 6.96 mg/dL — ABNORMAL HIGH (ref 0.44–1.00)
GFR, Estimated: 7 mL/min — ABNORMAL LOW (ref >60)
Glucose, Bld: 227 mg/dL — ABNORMAL HIGH (ref 70–99)
Potassium: 3.3 mmol/L — ABNORMAL LOW (ref 3.5–5.1)
Sodium: 135 mmol/L (ref 135–145)

## 2023-07-18 LAB — CBC
HCT: 26.7 % — ABNORMAL LOW (ref 36.0–46.0)
Hemoglobin: 8.7 g/dL — ABNORMAL LOW (ref 12.0–15.0)
MCH: 29.7 pg (ref 26.0–34.0)
MCHC: 32.6 g/dL (ref 30.0–36.0)
MCV: 91.1 fL (ref 80.0–100.0)
Platelets: 169 10*3/uL (ref 150–400)
RBC: 2.93 MIL/uL — ABNORMAL LOW (ref 3.87–5.11)
RDW: 14.1 % (ref 11.5–15.5)
WBC: 8.5 10*3/uL (ref 4.0–10.5)
nRBC: 0 % (ref 0.0–0.2)

## 2023-07-18 LAB — GLUCOSE, CAPILLARY: Glucose-Capillary: 207 mg/dL — ABNORMAL HIGH (ref 70–99)

## 2023-07-18 MED ORDER — PANTOPRAZOLE SODIUM 40 MG PO TBEC
40.0000 mg | DELAYED_RELEASE_TABLET | Freq: Two times a day (BID) | ORAL | 1 refills | Status: DC
  Filled 2023-07-18: qty 60, 30d supply, fill #0

## 2023-07-18 MED ORDER — ASPIRIN 81 MG PO TBEC: 81.0000 mg | DELAYED_RELEASE_TABLET | Freq: Every day | ORAL | Status: DC

## 2023-07-18 MED ORDER — ISOSORBIDE DINITRATE 20 MG PO TABS: 20.0000 mg | ORAL_TABLET | Freq: Three times a day (TID) | ORAL | Status: DC

## 2023-07-18 MED ORDER — HYDRALAZINE HCL 50 MG PO TABS: 50.0000 mg | ORAL_TABLET | Freq: Three times a day (TID) | ORAL | Status: DC

## 2023-07-18 NOTE — Progress Notes (Signed)
Pt unable to UF 1.5 L d/t patient had hypotension and cramping during TX. Pt standing wt 73.2 Kg. Dr. Arlean Hopping notified.  07/18/23 1304  Vitals  Temp 98.4 F (36.9 C)  Temp Source Oral  BP (!) 140/90  BP Location Left Wrist  BP Method Automatic  Patient Position (if appropriate) Lying  Pulse Rate 90  Resp 16  Oxygen Therapy  SpO2 100 %  O2 Device Room Air  During Treatment Monitoring  Intra-Hemodialysis Comments Tx completed  Post Treatment  Dialyzer Clearance Lightly streaked  Hemodialysis Intake (mL) 0 mL  Liters Processed 80  Fluid Removed (mL) 0 mL  Tolerated HD Treatment Yes  Post-Hemodialysis Comments P Tx completed.  Hemodialysis Catheter Right Subclavian Double lumen Permanent (Tunneled)  Placement Date/Time: 10/07/22 1458   Serial / Lot #: [id number]  Expiration Date: 05/11/27  Time Out: Correct patient;Correct site;Correct procedure  Maximum sterile barrier precautions: Hand hygiene;Cap;Mask;Sterile gown;Sterile gloves;Large sterile ...  Site Condition No complications  Blue Lumen Status Heparin locked  Red Lumen Status Heparin locked  Catheter fill solution Heparin 1000 units/ml  Catheter fill volume (Arterial) 1.9 cc  Catheter fill volume (Venous) 1.9  Dressing Status Antimicrobial disc in place;Clean, Dry, Intact  Interventions New dressing  Drainage Description None  Dressing Change Due 07/24/23  Post treatment catheter status Capped and Clamped

## 2023-07-18 NOTE — Progress Notes (Addendum)
[name] Progress Note   Subjective: New AVG placed per VVS 07/17/2023. Hypotensive on HD running even. Now with cold R hand and numbness in hand distal to AVG. Says it wasn't like this prior to HD. Reached out to VVS   Objective Vitals:   07/18/23 1000 07/18/23 1005 07/18/23 1030 07/18/23 1100  BP: 112/66 (!) 77/58 105/61 110/69  Pulse:      Resp:      Temp:      TempSrc:      SpO2: 100%     Weight:      Height:       Physical Exam General: Pleasant female in NAD Heart: S1,S2 No M/R/G. SR on monitor.  Lungs: CTAB Abdomen: NABs, NT Extremities: No LE edema Dialysis Access: R AVG + T/B. Now with coolness/numbness R hand that started after HD was initiated. RIJ TDC in use    Additional Objective Labs: Basic Metabolic Panel: Recent Labs  Lab 07/14/23 0645 07/14/23 0703 07/16/23 0730 07/17/23 0457 07/18/23 0925  NA 137   < > 133* 135 135  K 3.4*   < > 3.6 3.5 3.3*  CL 99   < > 98 98 100  CO2 19*   < > 25 26 23   GLUCOSE 261*   < > 219* 191* 227*  BUN 49*   < > 34* 25* 40*  CREATININE 7.69*   < > 7.10* 5.51* 6.96*  CALCIUM 9.3   < > 8.6* 8.5* 8.6*  PHOS 5.5*  --  5.5* 5.1*  --    < > = values in this interval not displayed.   Liver Function Tests: Recent Labs  Lab 07/14/23 0645 07/14/23 1811 07/15/23 1413 07/16/23 0730 07/17/23 0457  AST 13* 14* 17  --   --   ALT 8 7 9   --   --   ALKPHOS 68 68 62  --   --   BILITOT 1.7* 1.6* 2.0*  --   --   PROT 8.0 7.5 7.3  --   --   ALBUMIN 4.0 3.8 3.7 3.4* 3.4*   Recent Labs  Lab 07/14/23 0645  LIPASE 23   CBC: Recent Labs  Lab 07/14/23 1811 07/15/23 0740 07/15/23 1413 07/16/23 0730 07/17/23 0457 07/18/23 0925  WBC 8.0  --  9.8 7.0 7.5 8.5  NEUTROABS 5.9  --   --   --   --   --   HGB 11.1*   < > 10.3* 9.3* 9.5* 8.7*  HCT 34.0*   < > 31.6* 28.1* 28.7* 26.7*  MCV 89.9  --  90.5 90.4 88.9 91.1  PLT 249  --  200 171 159 169   < > = values in this interval not displayed.   Blood  Culture    Component Value Date/Time   SDES BLOOD RIGHT ANTECUBITAL 10/21/2022 1856   SPECREQUEST  10/21/2022 1856    BOTTLES DRAWN AEROBIC AND ANAEROBIC Blood Culture results may not be optimal due to an inadequate volume of blood received in culture bottles   CULT  10/21/2022 1856    NO GROWTH 5 DAYS Performed at [facility], [address] [place], [place] [address]    REPTSTATUS 10/26/2022 FINAL 10/21/2022 1856    Cardiac Enzymes: No results for input(s): "CKTOTAL", "CKMB", "CKMBINDEX", "TROPONINI" in the last 168 hours. CBG: Recent Labs  Lab 07/17/23 0920 07/17/23 1157 07/17/23 1505 07/17/23 2152 07/18/23 0732  GLUCAP 194* 127* 96 357* 207*   Iron Studies: No  results for input(s): "IRON", "TIBC", "TRANSFERRIN", "FERRITIN" in the last 72 hours. @lablastinr3 @ Studies/Results: No results found. Medications:  promethazine (PHENERGAN) injection (IM or IVPB) Stopped (07/17/23 1351)    carvedilol  3.125 mg Oral BID   Chlorhexidine Gluconate Cloth  6 each Topical Q0600   darbepoetin (ARANESP) injection - DIALYSIS  40 mcg Subcutaneous Q Thu-1800   hydrALAZINE  50 mg Oral TID   insulin aspart  0-5 Units Subcutaneous QHS   insulin aspart  0-9 Units Subcutaneous TID WC   insulin glargine-yfgn  10 Units Subcutaneous QHS   isosorbide dinitrate  20 mg Oral TID   pantoprazole (PROTONIX) IV  40 mg Intravenous Q12H     Dialysis Orders:  Center: [facility]  on MWF. 180Nre 4 hours BFR 400 DFR Auto 1.5 3K 2.5Ca EDW 74.9kg Heparin 3000 unit bolus and 2000 unit bolus mid run Mircera IV q 4 weeks  Venofer 50mg  IV weekly  Calcitriol 0.62mcg Po q HD   Assessment/Plan: Refractory nausea: Likely acute exacerbation of diabetic gastroparesis. S/p EGD . Per GI.  ESRD:  Tolerating well. Continue MWF schedule  Hypertension/volume: Hypotensive on HD. Pre wt 77 kg but unable to tolerate UF. Running even. EDW may need adjustment .  Anemia: Hgb 8.7, likely needs higher ESA  dose with recent GI bleed. Started aranesp 40 mcg SQ 07/17/2023. Follow HGB.   Metabolic bone disease: Calcium controlled. Continue VDRA once tolerating PO meds. Phos at goal, resume binders when eating  Nutrition:  Albumin 3.4. Renal diet. If tolerating without nausea will add protein supplements.  Dialysis access: AVG was already scheduled outpatient, appreciate VVS assistance. New AVG placed 07/17/23. R hand turned cold/numb after HD started. Radial pulse present. VVS notified.   Jakita Dutkiewicz H. Josilynn Losh NP-C 07/18/2023, 11:08 AM  [employer] [phone]

## 2023-07-18 NOTE — Plan of Care (Signed)
  Problem: Education: Goal: Knowledge of General Education information will improve Description: Including pain rating scale, medication(s)/side effects and non-pharmacologic comfort measures Outcome: Progressing   Problem: Health Behavior/Discharge Planning: Goal: Ability to manage health-related needs will improve Outcome: Progressing   Problem: Clinical Measurements: Goal: Ability to maintain clinical measurements within normal limits will improve Outcome: Progressing Goal: Will remain free from infection Outcome: Progressing   Problem: Pain Management: Goal: General experience of comfort will improve Outcome: Progressing

## 2023-07-18 NOTE — Progress Notes (Signed)
D/C order noted. Contacted [facility] to advise clinic of pt's d/c today and that pt should resume care on Monday.   Olivia Canter Renal Navigator [phone]

## 2023-07-18 NOTE — TOC Transition Note (Signed)
Transition of Care [facility]) - Discharge Note   Patient Details  Name: [name] MRN: [id number] Date of Birth: 10-22-1970  [facility] CM/SW Contact:  Mearl Latin, LCSW Phone Number: 07/18/2023, 2:10 PM   Clinical Narrative:    CSW received request for transport assistance home. RN to contact [facility] transport when ready.          Patient Goals and CMS Choice            Discharge Placement                       Discharge Plan and Services Additional resources added to the After Visit Summary for                                       Social Drivers of Health (SDOH) Interventions SDOH Screenings   Food Insecurity: No Food Insecurity (07/14/2023)  Housing: Low Risk  (07/17/2023)  Transportation Needs: No Transportation Needs (07/14/2023)  Utilities: Not At Risk (07/14/2023)  Alcohol Screen: Low Risk  (07/15/2022)  Depression (PHQ2-9): High Risk (12/26/2022)  Financial Resource Strain: Medium Risk (12/26/2022)  Physical Activity: Inactive (12/26/2022)  Social Connections: Socially Isolated (12/26/2022)  Stress: Stress Concern Present (01/07/2023)  Tobacco Use: Low Risk  (07/17/2023)     Readmission Risk Interventions    10/09/2022    3:27 PM 10/09/2022    3:25 PM 09/10/2022   10:49 AM  Readmission Risk Prevention Plan  Transportation Screening  Complete Complete  Medication Review [facility])  Complete Complete  PCP or Specialist appointment within 3-5 days of discharge  Complete Complete  HRI or Home Care Consult  Complete Complete  SW Recovery Care/Counseling Consult -- Complete Complete  Palliative Care Screening  Not Applicable Not Applicable  Skilled Nursing Facility  Not Applicable Not Applicable

## 2023-07-18 NOTE — Progress Notes (Signed)
[facility] transport called at 15:55

## 2023-07-18 NOTE — TOC Transition Note (Signed)
Transition of Care [facility]) - Discharge Note   Patient Details  Name: [name] MRN: [id number] Date of Birth: 04/12/1971  Transition of Care [facility]) CM/SW Contact:  Gordy Clement, RN Phone Number: 07/18/2023, 12:10 PM   Clinical Narrative:    Patient will DC to home today after HD  No TOC needs identifie. Patient will follow up as directed on AVS Family to transport           Patient Goals and CMS Choice            Discharge Placement                       Discharge Plan and Services Additional resources added to the After Visit Summary for                                       Social Drivers of Health (SDOH) Interventions SDOH Screenings   Food Insecurity: No Food Insecurity (07/14/2023)  Housing: Low Risk  (07/17/2023)  Transportation Needs: No Transportation Needs (07/14/2023)  Utilities: Not At Risk (07/14/2023)  Alcohol Screen: Low Risk  (07/15/2022)  Depression (PHQ2-9): High Risk (12/26/2022)  Financial Resource Strain: Medium Risk (12/26/2022)  Physical Activity: Inactive (12/26/2022)  Social Connections: Socially Isolated (12/26/2022)  Stress: Stress Concern Present (01/07/2023)  Tobacco Use: Low Risk  (07/17/2023)     Readmission Risk Interventions    10/09/2022    3:27 PM 10/09/2022    3:25 PM 09/10/2022   10:49 AM  Readmission Risk Prevention Plan  Transportation Screening  Complete Complete  Medication Review [facility])  Complete Complete  PCP or Specialist appointment within 3-5 days of discharge  Complete Complete  HRI or Home Care Consult  Complete Complete  SW Recovery Care/Counseling Consult -- Complete Complete  Palliative Care Screening  Not Applicable Not Applicable  Skilled Nursing Facility  Not Applicable Not Applicable

## 2023-07-18 NOTE — Discharge Summary (Signed)
Physician Discharge Summary  Myaisha Ledonne ZHY:[id number] DOB: 1971/06/18 DOA: 07/14/2023  PCP: Delma Officer, PA  Admit date: 07/14/2023 Discharge date: 07/18/2023  Admitted From: (Home) Disposition:  (Home )  Recommendations for Outpatient Follow-up:  Follow up with PCP in 1-2 weeks Please obtain BMP/CBC in one week  Diet recommendation: Heart Healthy / Carb Modified / Renal  Brief/Interim Summary:  Lakeishia Malaney is a 52 y.o. 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 Patient presents to ED with refractory n/v/ x 2-3 days with associated epigastric abdominal pain as well as episode of blood in emesis.  He went for endoscopy, significant for gastritis, esophagitis and gastric ulcer, she had AV graft done by vascular surgery.     Intractable nausea and vomiting secondary to diabetic gastroparesis/dumping syndrome Gastritis, reflux esophagitis with bleeding, esophageal ulcer with bleeding.  Esophagitis and gastric ulcer -Multifactorial, can be in the setting of her known chronic gastroparesis, with known dumping syndrome, as well can be related to her acute gastritis, reflux esophagitis with bleeding esophageal ulcer -Was seen by GI, went for endoscopy on 07-15-2023                            - LA Grade C reflux esophagitis with bleeding.                           - Esophageal ulcer oozing blood.                           - Z-line, 45 cm from the incisors.                           - Acute gastritis.                           - Normal examined duodenum.                           - No specimens collected.  -Continue to hold aspirin for next 14 days as an outpatient, can resume after that if hemoglobin remained stable. -Tolerating regular diet currently. -Continue with PPI, preferably twice daily for next few weeks  then once daily, patient reports she was out of her PPI for last few weeks.  No further recommendations as discussed with Dr. Bosie Clos, Protonix 40 mg p.o. twice daily was provided at time of discharge -With prolonged history of gastroparesis-dumping syndrome, she is being followed by Dr. Onalee Hua at [facility]   Hypokalemia -On HD, continue to monitor   ESRD on dialysis [facility]) -nephrology consulted for HD.  - Pt has right internal jugular HD catheter.  Status post right upper arm AV graft by Dr. Butch Penny 12/12    Hypertension, uncontrolled Nonischemic cardiomyopathy CAD -Will continue to hold aspirin on discharge due to bleeding esophageal ulcer, please see above discussion -Recent 2D echo showing drop in EF 35% -Tstats post AICD - cont isordil, coreg, hydralazine.   Uncontrolled type 1 diabetes mellitus with hyperglycemia, with long-term current use of insulin (HCC) 07-15-2023 pt with type 1 DM.  Had C-peptide in 12-2012 that was low at 0.15.  resume home medications on  discharge   Discharge Diagnoses:  Principal Problem:   Intractable nausea and vomiting secondary to diabetic gastroparesis Active Problems:   Hypertension, uncontrolled   ESRD on dialysis (HCC)   Hypokalemia   Uncontrolled type 1 diabetes mellitus with hyperglycemia, with long-term current use of insulin [facility])    Discharge Instructions  Discharge Instructions     Diet - low sodium heart healthy   Complete by: As directed    Discharge instructions   Complete by: As directed    Follow with Primary MD Delma Officer, PA in 7 days   Get CBC, CMP,  checked  by Primary MD next visit.    Activity: As tolerated with Full fall precautions use walker/cane & assistance as needed   Disposition Home    Diet: Renal/carb modified  For Heart failure patients - Check your Weight same time everyday, if you gain over 2 pounds, or you develop in leg swelling, experience more shortness of breath or chest pain, call your  Primary MD immediately. Follow Cardiac Low Salt Diet and 1.5 lit/day fluid restriction.   On your next visit with your primary care physician please Get Medicines reviewed and adjusted.   Please request your Prim.MD to go over all Hospital Tests and Procedure/Radiological results at the follow up, please get all Hospital records sent to your Prim MD by signing hospital release before you go home.   If you experience worsening of your admission symptoms, develop shortness of breath, life threatening emergency, suicidal or homicidal thoughts you must seek medical attention immediately by calling 911 or calling your MD immediately  if symptoms less severe.  You Must read complete instructions/literature along with all the possible adverse reactions/side effects for all the Medicines you take and that have been prescribed to you. Take any new Medicines after you have completely understood and accpet all the possible adverse reactions/side effects.   Do not drive, operating heavy machinery, perform activities at heights, swimming or participation in water activities or provide baby sitting services if your were admitted for syncope or siezures until you have seen by Primary MD or a Neurologist and advised to do so again.  Do not drive when taking Pain medications.    Do not take more than prescribed Pain, Sleep and Anxiety Medications  Special Instructions: If you have smoked or chewed Tobacco  in the last 2 yrs please stop smoking, stop any regular Alcohol  and or any Recreational drug use.  Wear Seat belts while driving.   Please note  You were cared for by a hospitalist during your hospital stay. If you have any questions about your discharge medications or the care you received while you were in the hospital after you are discharged, you can call the unit and asked to speak with the hospitalist on call if the hospitalist that took care of you is not available. Once you are discharged, your  primary care physician will handle any further medical issues. Please note that NO REFILLS for any discharge medications will be authorized once you are discharged, as it is imperative that you return to your primary care physician (or establish a relationship with a primary care physician if you do not have one) for your aftercare needs so that they can reassess your need for medications and monitor your lab values.   Increase activity slowly   Complete by: As directed       Allergies as of 07/18/2023       Reactions   Atorvastatin  Other (See Comments)   Dizziness    Dulaglutide Nausea And Vomiting, Other (See Comments)   Caused pancreatitis    Ivp Dye [iodinated Contrast Media] Hives, Itching, Nausea And Vomiting   tremors   Metoclopramide Other (See Comments)   Possible movement disorder or tardive dyskinesia while on reglan Long term   Other Itching   Peas and carrots   Sertraline Nausea And Vomiting   Vancomycin Itching, Swelling   Zofran [ondansetron] Nausea And Vomiting, Other (See Comments)   Causes nausea vomiting to worsen / doesn't really work for patient.   Amoxicillin Itching, Rash   Lantus [insulin Glargine] Itching, Rash   Has tolerated insulin glargine many times with no reported issues   Miconazole Nitrate Nausea And Vomiting, Rash        Medication List     TAKE these medications    acetaminophen 500 MG tablet Commonly known as: TYLENOL Take 1,000 mg by mouth every 8 (eight) hours as needed for moderate pain or mild pain.   albuterol 108 (90 Base) MCG/ACT inhaler Commonly known as: ProAir HFA Inhale 2 puffs into the lungs every 6 (six) hours as needed for wheezing or shortness of breath.   aspirin EC 81 MG tablet Take 1 tablet (81 mg total) by mouth daily. Swallow whole.  Resume in 2 weeks if hemoglobin remained stable Start taking on: August 01, 2023 What changed:  additional instructions These instructions start on August 01, 2023. If you are  unsure what to do until then, ask your doctor or other care provider.   carvedilol 3.125 MG tablet Commonly known as: COREG Take 1 tablet (3.125 mg total) by mouth 4 (four) times a week. Take twice daily on non Dialysis days (Tues,Thur,Sat, Sun) What changed:  when to take this additional instructions   cloNIDine 0.1 MG tablet Commonly known as: CATAPRES Take 0.1 mg by mouth daily as needed (bp > 180/120).   ezetimibe 10 MG tablet Commonly known as: ZETIA Take 1 tablet (10 mg total) by mouth daily.   hydrALAZINE 50 MG tablet Commonly known as: APRESOLINE Take 1 tablet (50 mg total) by mouth 3 (three) times daily. Hold hydralazine before dialysis, and take after dialysis if [facility] blood pressure>120 What changed:  how much to take additional instructions   hydrOXYzine 50 MG tablet Commonly known as: ATARAX Take 50 mg by mouth 3 (three) times daily as needed for anxiety.   insulin aspart 100 UNIT/ML injection Commonly known as: novoLOG Inject 120 Units into the skin See admin instructions. Patient uses this with the Omnipod insulin pump  its vary according to blood sugar. Loads 120 units every 3 days. What changed:  how much to take additional instructions   isosorbide dinitrate 20 MG tablet Commonly known as: ISORDIL Take 1 tablet (20 mg total) by mouth 3 (three) times daily. Please hold Imdur before HD, and take after the if systolic blood pressure>120 What changed: additional instructions   LORazepam 0.5 MG tablet Commonly known as: ATIVAN Take 1 tablet (0.5 mg total) by mouth every 8 (eight) hours as needed for anxiety (Refractory nausea and vomiting). What changed: when to take this   metoCLOPramide 5 MG tablet Commonly known as: REGLAN Take 5-10 mg by mouth every 8 (eight) hours as needed for vomiting or nausea.   MIRCERA IJ as directed. [facility]   nitroGLYCERIN 0.4 MG SL tablet Commonly known as: NITROSTAT Place 1 tablet (0.4 mg total) under the tongue  every 5 (five) minutes as needed  for chest pain.   olmesartan 20 MG tablet Commonly known as: BENICAR Take 20 mg by mouth daily.   Omnipod DASH Pods (Gen 4) Misc Place 1 Applicatorful onto the skin See admin instructions. Every three days   pantoprazole 40 MG tablet Commonly known as: Protonix Take 1 tablet (40 mg total) by mouth 2 (two) times daily.   polyethylene glycol 17 g packet Commonly known as: MIRALAX / GLYCOLAX Take 17 g by mouth daily as needed for moderate constipation.   pravastatin 40 MG tablet Commonly known as: PRAVACHOL Take 1 tablet (40 mg total) by mouth every evening.   pregabalin 50 MG capsule Commonly known as: LYRICA Take 50 mg by mouth daily as needed (neuropathy).   prochlorperazine 5 MG tablet Commonly known as: COMPAZINE Take 1 tablet (5 mg total) by mouth every 6 (six) hours as needed for nausea or vomiting.   scopolamine 1 MG/3DAYS Commonly known as: TRANSDERM-SCOP Place 1 patch (1.5 mg total) onto the skin every 3 (three) days.   tobramycin 0.3 % ophthalmic solution Commonly known as: TOBREX Place 1 drop into both eyes See admin instructions. Instill 1 drop into both eyes once every 8 weeks before eye injections   torsemide 20 MG tablet Commonly known as: DEMADEX Take 20 mg by mouth daily as needed (swelling).   Evaristo Bury FlexTouch 100 UNIT/ML FlexTouch Pen Generic drug: insulin degludec Inject into the skin as needed (when insulin pump is not working).   Vitamin D (Ergocalciferol) 1.25 MG (50000 UNIT) Caps capsule Commonly known as: DRISDOL Take 50,000 Units by mouth once a week.   Voquezna 10 MG Tabs Generic drug: Vonoprazan Fumarate Take 1 tablet by mouth daily.        Follow-up Information      [facility] Follow up.   Specialty: Vascular Surgery Why: As needed Contact information: [address] [place] [address] [phone]               Allergies   Allergen Reactions   Atorvastatin Other (See Comments)    Dizziness    Dulaglutide Nausea And Vomiting and Other (See Comments)    Caused pancreatitis    Ivp Dye [Iodinated Contrast Media] Hives, Itching and Nausea And Vomiting    tremors   Metoclopramide Other (See Comments)    Possible movement disorder or tardive dyskinesia while on reglan Long term   Other Itching    Peas and carrots   Sertraline Nausea And Vomiting   Vancomycin Itching and Swelling   Zofran [Ondansetron] Nausea And Vomiting and Other (See Comments)    Causes nausea vomiting to worsen / doesn't really work for patient.   Amoxicillin Itching and Rash   Lantus [Insulin Glargine] Itching and Rash    Has tolerated insulin glargine many times with no reported issues   Miconazole Nitrate Nausea And Vomiting and Rash    Consultations: Gastroenterology Nephrology   Procedures/Studies: CT ABDOMEN PELVIS WO CONTRAST Result Date: 07/14/2023 CLINICAL DATA:  Acute generalized abdominal pain. EXAM: CT ABDOMEN AND PELVIS WITHOUT CONTRAST TECHNIQUE: Multidetector CT imaging of the abdomen and pelvis was performed following the standard protocol without IV contrast. RADIATION DOSE REDUCTION: This exam was performed according to the departmental dose-optimization program which includes automated exposure control, adjustment of the mA and/or kV according to patient size and/or use of iterative reconstruction technique. COMPARISON:  October 05, 2022. FINDINGS: Lower chest: No acute abnormality. Hepatobiliary: No focal liver abnormality is seen. No gallstones, gallbladder  wall thickening, or biliary dilatation. Pancreas: Unremarkable. No pancreatic ductal dilatation or surrounding inflammatory changes. Spleen: Normal in size without focal abnormality. Adrenals/Urinary Tract: Adrenal glands are unremarkable. Kidneys are normal, without renal calculi, focal lesion, or hydronephrosis. Bladder is unremarkable. Stomach/Bowel: Small  sliding-type hiatal hernia. There is no evidence of bowel obstruction or inflammation. The appendix appears normal. Vascular/Lymphatic: No significant vascular findings are present. No enlarged abdominal or pelvic lymph nodes. Reproductive: Uterus and bilateral adnexa are unremarkable. Other: Small fat containing periumbilical hernia.  No ascites. Musculoskeletal: No acute or significant osseous findings. IMPRESSION: Small sliding-type hiatal hernia. Small fat containing periumbilical hernia. No other abnormality seen in the abdomen pelvis. Electronically Signed   By: Lupita Raider M.D.   On: 07/14/2023 08:37   DG Chest Portable 1 View Result Date: 07/14/2023 CLINICAL DATA:  Upper abdominal pain EXAM: PORTABLE CHEST 1 VIEW COMPARISON:  04/30/2023 FINDINGS: Central line with tip at the upper right atrium. Single chamber ICD lead into the right ventricle. There is no edema, consolidation, effusion, or pneumothorax. Normal heart size and mediastinal contours. IMPRESSION: No evidence of active disease. Electronically Signed   By: Tiburcio Pea M.D.   On: 07/14/2023 07:07      Subjective: No significant events overnight, reports she is feeling well today, and asking to go home after her HD today.  Discharge Exam: Vitals:   07/18/23 1304 07/18/23 1320  BP: (!) 140/90 (!) 142/70  Pulse: 90 93  Resp: 16   Temp: 98.4 F (36.9 C) 98.2 F (36.8 C)  SpO2: 100%    Vitals:   07/18/23 1230 07/18/23 1304 07/18/23 1310 07/18/23 1320  BP: 120/70 (!) 140/90  (!) 142/70  Pulse:  90  93  Resp:  16    Temp:  98.4 F (36.9 C)  98.2 F (36.8 C)  TempSrc:  Oral  Oral  SpO2:  100%    Weight:   73.2 kg   Height:        General: Pt is alert, awake, not in acute distress Cardiovascular: RRR, S1/S2 +, no rubs, no gallops Respiratory: CTA bilaterally, no wheezing, no rhonchi Abdominal: Soft, NT, ND, bowel sounds + Extremities: no edema, no cyanosis, right upper extremity post fistula placement with some  bruising, bandaged    The results of significant diagnostics from this hospitalization (including imaging, microbiology, ancillary and laboratory) are listed below for reference.     Microbiology: No results found for this or any previous visit (from the past 240 hours).   Labs: BNP (last 3 results) Recent Labs    10/04/22 1000 10/08/22 0606 10/09/22 0602  BNP 1,496.7* 2,182.7* 1,719.6*   Basic Metabolic Panel: Recent Labs  Lab 07/14/23 0645 07/14/23 0703 07/14/23 1811 07/15/23 1036 07/15/23 1413 07/16/23 0730 07/17/23 0457 07/18/23 0925  NA 137   < > 141 137 135 133* 135 135  K 3.4*   < > 3.7 3.6 3.4* 3.6 3.5 3.3*  CL 99   < > 101 98 97* 98 98 100  CO2 19*  --  23  --  25 25 26 23   GLUCOSE 261*   < > 181* 159* 161* 219* 191* 227*  BUN 49*   < > 56* 26* 26* 34* 25* 40*  CREATININE 7.69*   < > 8.35* 5.30* 5.50* 7.10* 5.51* 6.96*  CALCIUM 9.3  --  9.4  --  9.0 8.6* 8.5* 8.6*  MG 2.2  --   --   --   --   --   --   --  PHOS 5.5*  --   --   --   --  5.5* 5.1*  --    < > = values in this interval not displayed.   Liver Function Tests: Recent Labs  Lab 07/14/23 0645 07/14/23 1811 07/15/23 1413 07/16/23 0730 07/17/23 0457  AST 13* 14* 17  --   --   ALT 8 7 9   --   --   ALKPHOS 68 68 62  --   --   BILITOT 1.7* 1.6* 2.0*  --   --   PROT 8.0 7.5 7.3  --   --   ALBUMIN 4.0 3.8 3.7 3.4* 3.4*   Recent Labs  Lab 07/14/23 0645  LIPASE 23   No results for input(s): "AMMONIA" in the last 168 hours. CBC: Recent Labs  Lab 07/14/23 1811 07/15/23 0740 07/15/23 1036 07/15/23 1413 07/16/23 0730 07/17/23 0457 07/18/23 0925  WBC 8.0  --   --  9.8 7.0 7.5 8.5  NEUTROABS 5.9  --   --   --   --   --   --   HGB 11.1*   < > 11.6* 10.3* 9.3* 9.5* 8.7*  HCT 34.0*   < > 34.0* 31.6* 28.1* 28.7* 26.7*  MCV 89.9  --   --  90.5 90.4 88.9 91.1  PLT 249  --   --  200 171 159 169   < > = values in this interval not displayed.   Cardiac Enzymes: No results for input(s):  "CKTOTAL", "CKMB", "CKMBINDEX", "TROPONINI" in the last 168 hours. BNP: Invalid input(s): "POCBNP" CBG: Recent Labs  Lab 07/17/23 0920 07/17/23 1157 07/17/23 1505 07/17/23 2152 07/18/23 0732  GLUCAP 194* 127* 96 357* 207*   D-Dimer No results for input(s): "DDIMER" in the last 72 hours. Hgb A1c Recent Labs    07/15/23 1413  HGBA1C 7.9*   Lipid Profile No results for input(s): "CHOL", "HDL", "LDLCALC", "TRIG", "CHOLHDL", "LDLDIRECT" in the last 72 hours. Thyroid function studies No results for input(s): "TSH", "T4TOTAL", "T3FREE", "THYROIDAB" in the last 72 hours.  Invalid input(s): "FREET3" Anemia work up No results for input(s): "VITAMINB12", "FOLATE", "FERRITIN", "TIBC", "IRON", "RETICCTPCT" in the last 72 hours. Urinalysis    Component Value Date/Time   COLORURINE YELLOW 07/14/2023 0721   APPEARANCEUR CLOUDY (A) 07/14/2023 0721   LABSPEC 1.012 07/14/2023 0721   PHURINE 8.0 07/14/2023 0721   GLUCOSEU >=500 (A) 07/14/2023 0721   HGBUR NEGATIVE 07/14/2023 0721   BILIRUBINUR NEGATIVE 07/14/2023 0721   BILIRUBINUR negative 08/30/2020 1631   KETONESUR 20 (A) 07/14/2023 0721   PROTEINUR >=300 (A) 07/14/2023 0721   UROBILINOGEN 0.2 08/30/2020 1631   UROBILINOGEN 1.0 08/11/2008 1745   NITRITE NEGATIVE 07/14/2023 0721   LEUKOCYTESUR NEGATIVE 07/14/2023 0721   Sepsis Labs Recent Labs  Lab 07/15/23 1413 07/16/23 0730 07/17/23 0457 07/18/23 0925  WBC 9.8 7.0 7.5 8.5   Microbiology No results found for this or any previous visit (from the past 240 hours).   Time coordinating discharge: Over 30 minutes  SIGNED:   Huey Bienenstock, MD  [facility] 07/18/2023, 1:54 PM Pager   If 7PM-7AM, please contact night-coverage [email or web address]

## 2023-07-18 NOTE — Progress Notes (Signed)
  Progress Note    07/18/2023 7:42 AM 1 Day Post-Op  Subjective:  No complaints   Vitals:   07/18/23 0400 07/18/23 0734  BP: 135/75   Pulse: 84   Resp: 18 20  Temp: 97.7 F (36.5 C)   SpO2: 96%    Physical Exam: Lungs:  non labored Incisions:  R arm incisions c/d/i Extremities:  palpable R radial pulse; palpable thrill R AVG Neurologic: A&O  CBC    Component Value Date/Time   WBC 7.5 07/17/2023 0457   RBC 3.23 (L) 07/17/2023 0457   HGB 9.5 (L) 07/17/2023 0457   HGB 10.1 (L) 11/14/2021 1207   HCT 28.7 (L) 07/17/2023 0457   HCT 31.2 (L) 11/14/2021 1207   PLT 159 07/17/2023 0457   PLT 328 11/14/2021 1207   MCV 88.9 07/17/2023 0457   MCV 86 11/14/2021 1207   MCH 29.4 07/17/2023 0457   MCHC 33.1 07/17/2023 0457   RDW 13.8 07/17/2023 0457   RDW 14.0 11/14/2021 1207   LYMPHSABS 1.7 07/14/2023 1811   LYMPHSABS 1.3 04/23/2019 1554   MONOABS 0.4 07/14/2023 1811   EOSABS 0.0 07/14/2023 1811   EOSABS 0.0 04/23/2019 1554   BASOSABS 0.0 07/14/2023 1811   BASOSABS 0.0 04/23/2019 1554    BMET    Component Value Date/Time   NA 135 07/17/2023 0457   NA 139 11/14/2021 1207   K 3.5 07/17/2023 0457   CL 98 07/17/2023 0457   CO2 26 07/17/2023 0457   GLUCOSE 191 (H) 07/17/2023 0457   BUN 25 (H) 07/17/2023 0457   BUN 21 11/14/2021 1207   CREATININE 5.51 (H) 07/17/2023 0457   CALCIUM 8.5 (L) 07/17/2023 0457   GFRNONAA 9 (L) 07/17/2023 0457   GFRAA 102 01/31/2020 1008    INR    Component Value Date/Time   INR 1.2 04/30/2023 1038     Intake/Output Summary (Last 24 hours) at 07/18/2023 0742 Last data filed at 07/17/2023 1736 Gross per 24 hour  Intake 548.78 ml  Output 5 ml  Net 543.78 ml     Assessment/Plan:  52 y.o. female is s/p R arm AVG 1 Day Post-Op   Patent graft without steal symptoms R hand Ok to access graft in 1 month Ok for discharge from vascular standpoint; follow up as needed   Megan Rutter, PA-C [facility] [phone] 07/18/2023 7:42 AM

## 2023-07-19 ENCOUNTER — Telehealth: Payer: Self-pay | Admitting: Nurse Practitioner

## 2023-07-19 NOTE — Discharge Planning (Signed)
[facility] Patient Discharge Orders- [facility] CLINIC: [place]  Patient's name: [name] Admit/DC Dates: 07/14/2023 - 07/18/2023  Discharge Diagnoses: Intractable nausea and vomiting secondary to diabetic gastroparesis/dumping syndrome   Hypokalemia   Aranesp: Given: Yes   Date and amount of last dose: 40 mcg 07/18/2023 Last Hgb: 8.7 PRBC's Given: No Date/# of units: NA ESA dose for discharge: mircera 75 mcg IV q 2 weeks  IV Iron dose at discharge: Per protocol  Heparin change: No  EDW Change: Yes New EDW: 73 kg  Bath Change: No Continue 3.0 K bath with weekly K+ levels please  Access intervention/Change: RUA AVG placed 07/17/2023 Dr. Butch Penny Details: See note  Hectorol/Calcitriol change: No  Discharge Labs: Calcium 8.6 Phosphorus 5.1 Albumin 3.4 K+ 3.3  IV Antibiotics: NA Details:  On Coumadin?: NA Last INR: Next INR: Managed By:   OTHER/APPTS/LAB ORDERS:    D/C Meds to be reconciled by nurse after every discharge.  Completed By: Alonna Buckler [facility] [phone]   Reviewed by: MD:______ RN_______

## 2023-07-19 NOTE — Telephone Encounter (Signed)
Transition of care contact from inpatient facility  Date of Discharge: 07/18/2023 Date of Contact: 07/19/2023 Method of contact: Phone  Attempted to contact patient to discuss transition of care from inpatient admission. Patient did not answer the phone. Message was left on the patient's voicemail with call back number [phone].

## 2023-07-21 DIAGNOSIS — N186 End stage renal disease: Secondary | ICD-10-CM | POA: Diagnosis not present

## 2023-07-21 DIAGNOSIS — Z992 Dependence on renal dialysis: Secondary | ICD-10-CM | POA: Diagnosis not present

## 2023-07-21 DIAGNOSIS — N2581 Secondary hyperparathyroidism of renal origin: Secondary | ICD-10-CM | POA: Diagnosis not present

## 2023-07-21 DIAGNOSIS — T8249XA Other complication of vascular dialysis catheter, initial encounter: Secondary | ICD-10-CM | POA: Diagnosis not present

## 2023-07-24 DIAGNOSIS — K3184 Gastroparesis: Secondary | ICD-10-CM | POA: Diagnosis not present

## 2023-07-24 DIAGNOSIS — E876 Hypokalemia: Secondary | ICD-10-CM | POA: Diagnosis not present

## 2023-07-24 DIAGNOSIS — N186 End stage renal disease: Secondary | ICD-10-CM | POA: Diagnosis not present

## 2023-07-25 DIAGNOSIS — T8249XA Other complication of vascular dialysis catheter, initial encounter: Secondary | ICD-10-CM | POA: Diagnosis not present

## 2023-07-25 DIAGNOSIS — N186 End stage renal disease: Secondary | ICD-10-CM | POA: Diagnosis not present

## 2023-07-25 DIAGNOSIS — Z992 Dependence on renal dialysis: Secondary | ICD-10-CM | POA: Diagnosis not present

## 2023-07-25 DIAGNOSIS — N2581 Secondary hyperparathyroidism of renal origin: Secondary | ICD-10-CM | POA: Diagnosis not present

## 2023-07-27 DIAGNOSIS — N2581 Secondary hyperparathyroidism of renal origin: Secondary | ICD-10-CM | POA: Diagnosis not present

## 2023-07-27 DIAGNOSIS — N186 End stage renal disease: Secondary | ICD-10-CM | POA: Diagnosis not present

## 2023-07-27 DIAGNOSIS — Z992 Dependence on renal dialysis: Secondary | ICD-10-CM | POA: Diagnosis not present

## 2023-07-27 DIAGNOSIS — T8249XA Other complication of vascular dialysis catheter, initial encounter: Secondary | ICD-10-CM | POA: Diagnosis not present

## 2023-07-29 DIAGNOSIS — Z992 Dependence on renal dialysis: Secondary | ICD-10-CM | POA: Diagnosis not present

## 2023-07-29 DIAGNOSIS — N186 End stage renal disease: Secondary | ICD-10-CM | POA: Diagnosis not present

## 2023-07-29 DIAGNOSIS — N2581 Secondary hyperparathyroidism of renal origin: Secondary | ICD-10-CM | POA: Diagnosis not present

## 2023-07-29 DIAGNOSIS — T8249XA Other complication of vascular dialysis catheter, initial encounter: Secondary | ICD-10-CM | POA: Diagnosis not present

## 2023-08-01 DIAGNOSIS — N2581 Secondary hyperparathyroidism of renal origin: Secondary | ICD-10-CM | POA: Diagnosis not present

## 2023-08-01 DIAGNOSIS — Z992 Dependence on renal dialysis: Secondary | ICD-10-CM | POA: Diagnosis not present

## 2023-08-01 DIAGNOSIS — N186 End stage renal disease: Secondary | ICD-10-CM | POA: Diagnosis not present

## 2023-08-01 DIAGNOSIS — T8249XA Other complication of vascular dialysis catheter, initial encounter: Secondary | ICD-10-CM | POA: Diagnosis not present

## 2023-08-03 DIAGNOSIS — N2581 Secondary hyperparathyroidism of renal origin: Secondary | ICD-10-CM | POA: Diagnosis not present

## 2023-08-03 DIAGNOSIS — T8249XA Other complication of vascular dialysis catheter, initial encounter: Secondary | ICD-10-CM | POA: Diagnosis not present

## 2023-08-03 DIAGNOSIS — N186 End stage renal disease: Secondary | ICD-10-CM | POA: Diagnosis not present

## 2023-08-03 DIAGNOSIS — Z992 Dependence on renal dialysis: Secondary | ICD-10-CM | POA: Diagnosis not present

## 2023-08-05 DIAGNOSIS — I129 Hypertensive chronic kidney disease with stage 1 through stage 4 chronic kidney disease, or unspecified chronic kidney disease: Secondary | ICD-10-CM | POA: Diagnosis not present

## 2023-08-05 DIAGNOSIS — Z992 Dependence on renal dialysis: Secondary | ICD-10-CM | POA: Diagnosis not present

## 2023-08-05 DIAGNOSIS — N2581 Secondary hyperparathyroidism of renal origin: Secondary | ICD-10-CM | POA: Diagnosis not present

## 2023-08-05 DIAGNOSIS — T8249XA Other complication of vascular dialysis catheter, initial encounter: Secondary | ICD-10-CM | POA: Diagnosis not present

## 2023-08-05 DIAGNOSIS — N186 End stage renal disease: Secondary | ICD-10-CM | POA: Diagnosis not present

## 2023-08-08 DIAGNOSIS — Z992 Dependence on renal dialysis: Secondary | ICD-10-CM | POA: Diagnosis not present

## 2023-08-08 DIAGNOSIS — T8249XA Other complication of vascular dialysis catheter, initial encounter: Secondary | ICD-10-CM | POA: Diagnosis not present

## 2023-08-08 DIAGNOSIS — N2581 Secondary hyperparathyroidism of renal origin: Secondary | ICD-10-CM | POA: Diagnosis not present

## 2023-08-08 DIAGNOSIS — N186 End stage renal disease: Secondary | ICD-10-CM | POA: Diagnosis not present

## 2023-08-11 DIAGNOSIS — T8249XA Other complication of vascular dialysis catheter, initial encounter: Secondary | ICD-10-CM | POA: Diagnosis not present

## 2023-08-11 DIAGNOSIS — N2581 Secondary hyperparathyroidism of renal origin: Secondary | ICD-10-CM | POA: Diagnosis not present

## 2023-08-11 DIAGNOSIS — N186 End stage renal disease: Secondary | ICD-10-CM | POA: Diagnosis not present

## 2023-08-11 DIAGNOSIS — Z992 Dependence on renal dialysis: Secondary | ICD-10-CM | POA: Diagnosis not present

## 2023-08-12 ENCOUNTER — Ambulatory Visit: Payer: BC Managed Care – PPO | Attending: Internal Medicine

## 2023-08-12 ENCOUNTER — Ambulatory Visit (INDEPENDENT_AMBULATORY_CARE_PROVIDER_SITE_OTHER): Payer: BC Managed Care – PPO | Admitting: Physician Assistant

## 2023-08-12 VITALS — BP 160/120 | HR 102 | Temp 98.2°F | Resp 18 | Ht 72.0 in | Wt 166.4 lb

## 2023-08-12 DIAGNOSIS — N186 End stage renal disease: Secondary | ICD-10-CM

## 2023-08-12 DIAGNOSIS — Z992 Dependence on renal dialysis: Secondary | ICD-10-CM

## 2023-08-12 DIAGNOSIS — T82868A Thrombosis of vascular prosthetic devices, implants and grafts, initial encounter: Secondary | ICD-10-CM

## 2023-08-12 NOTE — Progress Notes (Deleted)
 Patient ID: [name]                 DOB: [birth year]                    MRN: [id number]      HPI: [name] is a 53 y.o. female patient referred to lipid clinic by ***. 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 Has ESRD and is pending renal transplant discussions. Type 1 DM with gastroparesis.   Reviewed options for lowering LDL cholesterol, including ezetimibe , PCSK-9 inhibitors, bempedoic acid and inclisiran.  Discussed mechanisms of action, dosing, side effects and potential decreases in LDL cholesterol.  Also reviewed cost information and potential options for patient assistance.  Current Medications: Pravastatin  40 mg daily, Zetia  10 mg daily  Intolerances: atorvastatin  and rosuvastatin  Risk Factors: T1DM, ESRD, NICM, HTN, family hx of ASCVD  LDL goal: <55 mg/dl  Last Lab: LDLc 816, TG 94, TC 262, HDL 63 -12/2022 Diet:   Exercise:   Family History:  Family History Pedigree Relation Problem Comments  Mother (Deceased) Bipolar disorder   Calcium  disorder   Diabetes type 2  Heart disease   Hypertension   Thyroid  disease     Father Metallurgist)   Sister Metallurgist) Diabetes   Heart failure     Brother (Alive)   Maternal Aunt Stroke     Maternal Aunt CAD stents    Maternal Aunt Cancer (Age: 63) ovarian    Maternal Uncle Cancer prostate    Maternal Grandmother (Deceased) Cancer Breast, stomach    Maternal Grandfather (Deceased)   Paternal Grandmother Metallurgist) Cancer stomach, lung (smoker)  Diabetes     Paternal Grandfather (Deceased) Diabetes     Social History:   Labs: Lipid Panel     Component Value Date/Time   CHOL 262 (H) 12/05/2022 0848   TRIG 94 12/05/2022 0848   HDL 63 12/05/2022 0848   CHOLHDL 4.2 12/05/2022 0848   CHOLHDL 5.2 10/12/2019 0834   VLDL 16 10/12/2019 0834   LDLCALC 183 (H) 12/05/2022 0848   LDLDIRECT 104.1 04/12/2014 1608   LABVLDL 16 12/05/2022 0848     Past Medical History:  Diagnosis Date   AICD (automatic cardioverter/defibrillator) present    [employer]   Anemia    Anxiety    Arthritis    Asthma    CHF (congestive heart failure) (HCC)    Chronic diastolic CHF (congestive heart failure) (HCC) 12/14/2019   Depression    Diabetic ulcer of left foot (HCC) 05/12/2013   ESRD on hemodialysis (HCC)    MWF at [facility]   Gastroparesis    Generalized abdominal pain    History of chicken pox    Loss of weight 12/02/2019   Migraines    Mixed hyperlipidemia 09/05/2022   Mood disorder (HCC)    anxiety   Myocardial infarction (HCC)    NSTEMI (non-ST elevated myocardial infarction) (HCC) 05/13/2022   Prurigo nodularis    with diabetic dermopathy   Type 1 diabetes mellitus (HCC) 05/23/2022   Type 1 diabetes, uncontrolled, with neuropathy    [place]   Ulcers of both lower legs (HCC) 02/20/2014    Current Outpatient Medications on File Prior to Visit  Medication Sig Dispense Refill   acetaminophen  (TYLENOL ) 500 MG tablet Take 1,000 mg by mouth every 8 (eight) hours as needed for moderate pain or mild pain.     albuterol  (PROAIR  HFA)  108 (90 Base) MCG/ACT inhaler Inhale 2 puffs into the lungs every 6 (six) hours as needed for wheezing or shortness of breath. 6.7 g 1   aspirin  EC 81 MG tablet Take 1 tablet (81 mg total) by mouth daily. Swallow whole.  Resume in 2 weeks if hemoglobin remained stable     carvedilol  (COREG ) 3.125 MG tablet Take 1 tablet (3.125 mg total) by mouth 4 (four) times a week. Take twice daily on non Dialysis days (Tues,Thur,Sat, Sun) (Patient taking differently: Take 3.125 mg by mouth in the morning and at bedtime.) 96 tablet 3   cloNIDine  (CATAPRES ) 0.1 MG tablet Take 0.1 mg by mouth daily as needed (bp > 180/120).     ezetimibe  (ZETIA ) 10 MG tablet Take 1 tablet (10 mg total) by mouth daily. 90 tablet 3   hydrALAZINE  (APRESOLINE ) 50 MG tablet Take 1 tablet (50 mg total) by mouth 3 (three) times daily. Hold  hydralazine  before dialysis, and take after dialysis if [facility] blood pressure>120     hydrOXYzine  (ATARAX ) 50 MG tablet Take 50 mg by mouth 3 (three) times daily as needed for anxiety.     insulin  aspart (NOVOLOG ) 100 UNIT/ML injection Inject 120 Units into the skin See admin instructions. Patient uses this with the Omnipod insulin  pump  its vary according to blood sugar. Loads 120 units every 3 days. (Patient taking differently: Inject 150 Units into the skin See admin instructions. Patient uses this with the Omnipod insulin  pump  its vary according to blood sugar. Loads 150 units every 3 days.)     Insulin  Disposable Pump (OMNIPOD DASH PODS, GEN 4,) MISC Place 1 Applicatorful onto the skin See admin instructions. Every three days     isosorbide  dinitrate (ISORDIL ) 20 MG tablet Take 1 tablet (20 mg total) by mouth 3 (three) times daily. Please hold Imdur  before HD, and take after the if systolic blood pressure>120     LORazepam  (ATIVAN ) 0.5 MG tablet Take 1 tablet (0.5 mg total) by mouth every 8 (eight) hours as needed for anxiety (Refractory nausea and vomiting). (Patient taking differently: Take 0.5 mg by mouth every 6 (six) hours as needed for anxiety (Refractory nausea and vomiting).) 30 tablet 0   Methoxy PEG-Epoetin  Beta (MIRCERA IJ) as directed. Kidney clinic     metoCLOPramide  (REGLAN ) 5 MG tablet Take 5-10 mg by mouth every 8 (eight) hours as needed for vomiting or nausea.     nitroGLYCERIN  (NITROSTAT ) 0.4 MG SL tablet Place 1 tablet (0.4 mg total) under the tongue every 5 (five) minutes as needed for chest pain. 90 tablet 3   olmesartan (BENICAR) 20 MG tablet Take 20 mg by mouth daily.     pantoprazole  (PROTONIX ) 40 MG tablet Take 1 tablet (40 mg total) by mouth 2 (two) times daily. 60 tablet 1   polyethylene glycol (MIRALAX  / GLYCOLAX ) 17 g packet Take 17 g by mouth daily as needed for moderate constipation.     pravastatin  (PRAVACHOL ) 40 MG tablet Take 1 tablet (40 mg total) by mouth every  evening. (Patient not taking: Reported on 07/15/2023) 90 tablet 3   pregabalin  (LYRICA ) 50 MG capsule Take 50 mg by mouth daily as needed (neuropathy).     prochlorperazine  (COMPAZINE ) 5 MG tablet Take 1 tablet (5 mg total) by mouth every 6 (six) hours as needed for nausea or vomiting. 15 tablet 0   scopolamine  (TRANSDERM-SCOP) 1 MG/3DAYS Place 1 patch (1.5 mg total) onto the skin every 3 (three) days. 24 patch 4  tobramycin  (TOBREX ) 0.3 % ophthalmic solution Place 1 drop into both eyes See admin instructions. Instill 1 drop into both eyes once every 8 weeks before eye injections     torsemide  (DEMADEX ) 20 MG tablet Take 20 mg by mouth daily as needed (swelling).     TRESIBA  FLEXTOUCH 100 UNIT/ML FlexTouch Pen Inject into the skin as needed (when insulin  pump is not working).     Vitamin D , Ergocalciferol , (DRISDOL ) 1.25 MG (50000 UNIT) CAPS capsule Take 50,000 Units by mouth once a week. (Patient not taking: Reported on 07/15/2023)     VOQUEZNA 10 MG TABS Take 1 tablet by mouth daily.     Current Facility-Administered Medications on File Prior to Visit  Medication Dose Route Frequency Provider Last Rate Last Admin   alteplase  (CATHFLO ACTIVASE ) injection 2 mg  2 mg Intracatheter Once PRN Collins, Samantha G, PA-C       anticoagulant sodium citrate  solution 5 mL  5 mL Intracatheter PRN Collins, Samantha G, PA-C       heparin  injection 1,000 Units  1,000 Units Intracatheter PRN Collins, Samantha G, PA-C   5,000 Units at 07/17/23 9170   lidocaine  (PF) (XYLOCAINE ) 1 % injection 5 mL  5 mL Intradermal PRN Collins, Samantha G, PA-C       lidocaine -prilocaine  (EMLA ) cream 1 Application  1 Application Topical PRN Collins, Samantha G, PA-C       pentafluoroprop-tetrafluoroeth (GEBAUERS) aerosol 1 Application  1 Application Topical PRN Collins, Samantha G, PA-C        Allergies  Allergen Reactions   Atorvastatin  Other (See Comments)    Dizziness    Dulaglutide Nausea And Vomiting and Other (See  Comments)    Caused pancreatitis    Ivp Dye [Iodinated Contrast Media] Hives, Itching and Nausea And Vomiting    tremors   Metoclopramide  Other (See Comments)    Possible movement disorder or tardive dyskinesia while on reglan  Long term   Other Itching    Peas and carrots   Sertraline Nausea And Vomiting   Vancomycin  Itching and Swelling   Zofran  [Ondansetron ] Nausea And Vomiting and Other (See Comments)    Causes nausea vomiting to worsen / doesn't really work for patient.   Amoxicillin Itching and Rash   Lantus  [Insulin  Glargine] Itching and Rash    Has tolerated insulin  glargine many times with no reported issues   Miconazole Nitrate Nausea And Vomiting and Rash    Assessment/Plan:  1. Hyperlipidemia -  No problems updated. No problem-specific Assessment & Plan notes found for this encounter.    Thank you,  Robbi Blanch, Pharm.D [facility] of [facility] [address], [place], [place] [address]  Phone: [phone]; Fax: [phone]

## 2023-08-13 ENCOUNTER — Other Ambulatory Visit: Payer: Self-pay | Admitting: *Deleted

## 2023-08-13 DIAGNOSIS — N186 End stage renal disease: Secondary | ICD-10-CM

## 2023-08-13 DIAGNOSIS — N2581 Secondary hyperparathyroidism of renal origin: Secondary | ICD-10-CM | POA: Diagnosis not present

## 2023-08-13 DIAGNOSIS — Z01818 Encounter for other preprocedural examination: Secondary | ICD-10-CM

## 2023-08-13 DIAGNOSIS — T8249XA Other complication of vascular dialysis catheter, initial encounter: Secondary | ICD-10-CM | POA: Diagnosis not present

## 2023-08-13 DIAGNOSIS — Z992 Dependence on renal dialysis: Secondary | ICD-10-CM | POA: Diagnosis not present

## 2023-08-14 ENCOUNTER — Ambulatory Visit (HOSPITAL_COMMUNITY)
Admission: RE | Admit: 2023-08-14 | Discharge: 2023-08-14 | Disposition: A | Discharge status: Home / Self Care | Payer: BC Managed Care – PPO | Source: Ambulatory Visit | Attending: Vascular Surgery | Admitting: Vascular Surgery

## 2023-08-14 DIAGNOSIS — N186 End stage renal disease: Secondary | ICD-10-CM

## 2023-08-14 DIAGNOSIS — Z992 Dependence on renal dialysis: Secondary | ICD-10-CM | POA: Diagnosis not present

## 2023-08-14 DIAGNOSIS — Z01818 Encounter for other preprocedural examination: Secondary | ICD-10-CM

## 2023-08-14 LAB — VAS US ABI WITH/WO TBI: Left ABI: 1.21

## 2023-08-14 NOTE — Progress Notes (Signed)
 POST OPERATIVE OFFICE NOTE    CC:  F/u for surgery  HPI: [name] is a 53 y.o. female who is s/p right upper arm AV graft creation on 07/17/2023 by Dr. Magda.  This was done for permanent dialysis access.  She has a history of left brachiobasilic fistula which thrombosed prior to its use.  This fistula also set off alarms in her ICD, which is implanted in her left-sided chest wall. She also has had a right forearm loop graft which unfortunately thrombosed within 2 to 3 months of its use.  She returns today for follow-up.  She states her right upper arm incisions have healed well without any issue.  She denies any symptoms of steal in the right hand including weakness, numbness, excessive coldness, or pain.   She continues dialysis through a right IJ TDC on MWF.   Allergies  Allergen Reactions   Atorvastatin  Other (See Comments)    Dizziness    Dulaglutide Nausea And Vomiting and Other (See Comments)    Caused pancreatitis    Ivp Dye [Iodinated Contrast Media] Hives, Itching and Nausea And Vomiting    tremors   Metoclopramide  Other (See Comments)    Possible movement disorder or tardive dyskinesia while on reglan  Long term   Other Itching    Peas and carrots   Sertraline Nausea And Vomiting   Vancomycin  Itching and Swelling   Zofran  [Ondansetron ] Nausea And Vomiting and Other (See Comments)    Causes nausea vomiting to worsen / doesn't really work for patient.   Amoxicillin Itching and Rash   Lantus  [Insulin  Glargine] Itching and Rash    Has tolerated insulin  glargine many times with no reported issues   Miconazole Nitrate Nausea And Vomiting and Rash    Current Outpatient Medications  Medication Sig Dispense Refill   acetaminophen  (TYLENOL ) 500 MG tablet Take 1,000 mg by mouth every 8 (eight) hours as needed for moderate pain or mild pain.     albuterol  (PROAIR  HFA) 108 (90 Base) MCG/ACT inhaler Inhale 2 puffs into the lungs every 6 (six) hours as needed for wheezing  or shortness of breath. 6.7 g 1   aspirin  EC 81 MG tablet Take 1 tablet (81 mg total) by mouth daily. Swallow whole.  Resume in 2 weeks if hemoglobin remained stable     carvedilol  (COREG ) 3.125 MG tablet Take 1 tablet (3.125 mg total) by mouth 4 (four) times a week. Take twice daily on non Dialysis days (Tues,Thur,Sat, Sun) (Patient taking differently: Take 3.125 mg by mouth in the morning and at bedtime.) 96 tablet 3   cloNIDine  (CATAPRES ) 0.1 MG tablet Take 0.1 mg by mouth daily as needed (bp > 180/120).     ezetimibe  (ZETIA ) 10 MG tablet Take 1 tablet (10 mg total) by mouth daily. 90 tablet 3   hydrALAZINE  (APRESOLINE ) 50 MG tablet Take 1 tablet (50 mg total) by mouth 3 (three) times daily. Hold hydralazine  before dialysis, and take after dialysis if [facility] blood pressure>120     hydrOXYzine  (ATARAX ) 50 MG tablet Take 50 mg by mouth 3 (three) times daily as needed for anxiety.     insulin  aspart (NOVOLOG ) 100 UNIT/ML injection Inject 120 Units into the skin See admin instructions. Patient uses this with the Omnipod insulin  pump  its vary according to blood sugar. Loads 120 units every 3 days. (Patient taking differently: Inject 150 Units into the skin See admin instructions. Patient uses this with the Omnipod insulin  pump  its vary according to  blood sugar. Loads 150 units every 3 days.)     Insulin  Disposable Pump (OMNIPOD DASH PODS, GEN 4,) MISC Place 1 Applicatorful onto the skin See admin instructions. Every three days     isosorbide  dinitrate (ISORDIL ) 20 MG tablet Take 1 tablet (20 mg total) by mouth 3 (three) times daily. Please hold Imdur  before HD, and take after the if systolic blood pressure>120     LORazepam  (ATIVAN ) 0.5 MG tablet Take 1 tablet (0.5 mg total) by mouth every 8 (eight) hours as needed for anxiety (Refractory nausea and vomiting). (Patient taking differently: Take 0.5 mg by mouth every 6 (six) hours as needed for anxiety (Refractory nausea and vomiting).) 30 tablet 0   Methoxy  PEG-Epoetin  Beta (MIRCERA IJ) as directed. Kidney clinic     metoCLOPramide  (REGLAN ) 5 MG tablet Take 5-10 mg by mouth every 8 (eight) hours as needed for vomiting or nausea.     nitroGLYCERIN  (NITROSTAT ) 0.4 MG SL tablet Place 1 tablet (0.4 mg total) under the tongue every 5 (five) minutes as needed for chest pain. 90 tablet 3   olmesartan (BENICAR) 20 MG tablet Take 20 mg by mouth daily.     pantoprazole  (PROTONIX ) 40 MG tablet Take 1 tablet (40 mg total) by mouth 2 (two) times daily. 60 tablet 1   polyethylene glycol (MIRALAX  / GLYCOLAX ) 17 g packet Take 17 g by mouth daily as needed for moderate constipation.     pravastatin  (PRAVACHOL ) 40 MG tablet Take 1 tablet (40 mg total) by mouth every evening. 90 tablet 3   pregabalin  (LYRICA ) 50 MG capsule Take 50 mg by mouth daily as needed (neuropathy).     prochlorperazine  (COMPAZINE ) 5 MG tablet Take 1 tablet (5 mg total) by mouth every 6 (six) hours as needed for nausea or vomiting. 15 tablet 0   scopolamine  (TRANSDERM-SCOP) 1 MG/3DAYS Place 1 patch (1.5 mg total) onto the skin every 3 (three) days. 24 patch 4   tobramycin  (TOBREX ) 0.3 % ophthalmic solution Place 1 drop into both eyes See admin instructions. Instill 1 drop into both eyes once every 8 weeks before eye injections     torsemide  (DEMADEX ) 20 MG tablet Take 20 mg by mouth daily as needed (swelling).     TRESIBA  FLEXTOUCH 100 UNIT/ML FlexTouch Pen Inject into the skin as needed (when insulin  pump is not working).     Vitamin D , Ergocalciferol , (DRISDOL ) 1.25 MG (50000 UNIT) CAPS capsule Take 50,000 Units by mouth once a week.     VOQUEZNA 10 MG TABS Take 1 tablet by mouth daily.     No current facility-administered medications for this visit.   Facility-Administered Medications Ordered in Other Visits  Medication Dose Route Frequency Provider Last Rate Last Admin   alteplase  (CATHFLO ACTIVASE ) injection 2 mg  2 mg Intracatheter Once PRN Collins, Samantha G, PA-C       anticoagulant  sodium citrate  solution 5 mL  5 mL Intracatheter PRN Collins, Samantha G, PA-C       heparin  injection 1,000 Units  1,000 Units Intracatheter PRN Collins, Samantha G, PA-C   5,000 Units at 07/17/23 9170   lidocaine  (PF) (XYLOCAINE ) 1 % injection 5 mL  5 mL Intradermal PRN Collins, Samantha G, PA-C       lidocaine -prilocaine  (EMLA ) cream 1 Application  1 Application Topical PRN Collins, Samantha G, PA-C       pentafluoroprop-tetrafluoroeth (GEBAUERS) aerosol 1 Application  1 Application Topical PRN Collins, Samantha G, PA-C  ROS:  See HPI  Physical Exam:  Incision:  right upper arm incisions well healed without dehiscence, drainage, or erythema Extremities:  right upper arm AVG with no palpable thrill. Bruit cannot be auscultated. Palpable right radial pulse Neuro: intact motor and sensation of RUE   Assessment/Plan:  This is a 53 y.o. female who is here for a post op visit  -The patient's right upper arm incisions are healing appropriately without any signs of infection or bleeding -She denies any symptoms of steal such as right hand weakness, numbness, excessive coldness, or pain.  She has a palpable right radial pulse -On exam her right upper arm graft does not have a bruit or thrill.  Unfortunately the patient's graft is occluded.  Timing of this is unknown. -I discussed this case with Dr. Magda.  The patient has run out of access options in her right arm.  She is not a good candidate for any further access in her left arm, given that her previous left brachiobasilic fistula interfered with her ICD. -The patient's next permanent dialysis access option would be for a thigh AV graft.  She does not have easily palpable pedal pulses on exam.  We will need to obtain ABIs prior to her surgery, to determine whether or not her arterial flow can support a graft -We will tentatively schedule the patient for thigh AV graft placement in several weeks with Dr. Magda.   Ahmed Holster,  PA-C [facility] [phone]   Clinic MD:  [facility]

## 2023-08-15 DIAGNOSIS — N2581 Secondary hyperparathyroidism of renal origin: Secondary | ICD-10-CM | POA: Diagnosis not present

## 2023-08-15 DIAGNOSIS — T8249XA Other complication of vascular dialysis catheter, initial encounter: Secondary | ICD-10-CM | POA: Diagnosis not present

## 2023-08-15 DIAGNOSIS — N186 End stage renal disease: Secondary | ICD-10-CM | POA: Diagnosis not present

## 2023-08-15 DIAGNOSIS — Z992 Dependence on renal dialysis: Secondary | ICD-10-CM | POA: Diagnosis not present

## 2023-08-18 DIAGNOSIS — N2581 Secondary hyperparathyroidism of renal origin: Secondary | ICD-10-CM | POA: Diagnosis not present

## 2023-08-18 DIAGNOSIS — Z992 Dependence on renal dialysis: Secondary | ICD-10-CM | POA: Diagnosis not present

## 2023-08-18 DIAGNOSIS — T8249XA Other complication of vascular dialysis catheter, initial encounter: Secondary | ICD-10-CM | POA: Diagnosis not present

## 2023-08-18 DIAGNOSIS — N186 End stage renal disease: Secondary | ICD-10-CM | POA: Diagnosis not present

## 2023-08-19 DIAGNOSIS — E113513 Type 2 diabetes mellitus with proliferative diabetic retinopathy with macular edema, bilateral: Secondary | ICD-10-CM | POA: Diagnosis not present

## 2023-08-19 DIAGNOSIS — H43823 Vitreomacular adhesion, bilateral: Secondary | ICD-10-CM | POA: Diagnosis not present

## 2023-08-19 DIAGNOSIS — H35033 Hypertensive retinopathy, bilateral: Secondary | ICD-10-CM | POA: Diagnosis not present

## 2023-08-19 DIAGNOSIS — H2513 Age-related nuclear cataract, bilateral: Secondary | ICD-10-CM | POA: Diagnosis not present

## 2023-08-19 DIAGNOSIS — H3582 Retinal ischemia: Secondary | ICD-10-CM | POA: Diagnosis not present

## 2023-08-20 DIAGNOSIS — N2581 Secondary hyperparathyroidism of renal origin: Secondary | ICD-10-CM | POA: Diagnosis not present

## 2023-08-20 DIAGNOSIS — N186 End stage renal disease: Secondary | ICD-10-CM | POA: Diagnosis not present

## 2023-08-20 DIAGNOSIS — Z992 Dependence on renal dialysis: Secondary | ICD-10-CM | POA: Diagnosis not present

## 2023-08-20 DIAGNOSIS — T8249XA Other complication of vascular dialysis catheter, initial encounter: Secondary | ICD-10-CM | POA: Diagnosis not present

## 2023-08-21 ENCOUNTER — Ambulatory Visit: Payer: BC Managed Care – PPO

## 2023-08-21 NOTE — Progress Notes (Deleted)
Patient ID: [name]                 DOB: September 29, 1970                    MRN: [id number]      HPI: [name] is a 53 y.o. female patient referred to lipid clinic by Dr.Chandrasekhar. PMH is significant for HTN with prior HTN urgency, HFrecovered EF, SCD with ICD 2023 for aborted cardiac arrest. Has ESRD and is pending renal transplant discussions. Type 1 DM with gastroparesis.   Reviewed options for lowering LDL cholesterol, including ezetimibe, PCSK-9 inhibitors, bempedoic acid and inclisiran.  Discussed mechanisms of action, dosing, side effects and potential decreases in LDL cholesterol.  Also reviewed cost information and potential options for patient assistance.  Current Medications: Pravastatin 40 mg daily, Zetia 10 mg daily  Intolerances: atorvastatin and rosuvastatin Risk Factors: T1DM, ESRD, NICM, HTN, family hx of ASCVD  LDL goal: <55 mg/dl  Last Lab: LDLc 166, TG 94, TC 262, HDL 63 -12/2022 Diet:   Exercise:   Family History Relation Problem Comments  Mother (Deceased) Bipolar disorder   Calcium disorder   Diabetes type 2  Heart disease   Hypertension   Thyroid disease     Father Metallurgist)   Sister Metallurgist) Diabetes   Heart failure     Brother (Alive)   Maternal Aunt Stroke     Maternal Aunt CAD stents    Maternal Aunt Cancer (Age: 47) ovarian    Maternal Uncle Cancer prostate    Maternal Grandmother (Deceased) Cancer Breast, stomach    Maternal Grandfather (Deceased)   Paternal Grandmother Metallurgist) Cancer stomach, lung (smoker)  Diabetes     Paternal Grandfather (Deceased) Diabetes     Social History:   Labs: Lipid Panel     Component Value Date/Time   CHOL 262 (H) 12/05/2022 0848   TRIG 94 12/05/2022 0848   HDL 63 12/05/2022 0848   CHOLHDL 4.2 12/05/2022 0848   CHOLHDL 5.2 10/12/2019 0834   VLDL 16 10/12/2019 0834   LDLCALC 183 (H) 12/05/2022 0848   LDLDIRECT 104.1 04/12/2014 1608   LABVLDL 16 12/05/2022 0848    Past Medical  History:  Diagnosis Date   AICD (automatic cardioverter/defibrillator) present    Medtronic   Anemia    Anxiety    Arthritis    Asthma    CHF (congestive heart failure) (HCC)    Chronic diastolic CHF (congestive heart failure) (HCC) 12/14/2019   Depression    Diabetic ulcer of left foot (HCC) 05/12/2013   ESRD on hemodialysis (HCC)    MWF at [facility]   Gastroparesis    Generalized abdominal pain    History of chicken pox    Loss of weight 12/02/2019   Migraines    Mixed hyperlipidemia 09/05/2022   Mood disorder (HCC)    anxiety   Myocardial infarction (HCC)    NSTEMI (non-ST elevated myocardial infarction) (HCC) 05/13/2022   Prurigo nodularis    with diabetic dermopathy   Type 1 diabetes mellitus (HCC) 05/23/2022   Type 1 diabetes, uncontrolled, with neuropathy    Phadke   Ulcers of both lower legs (HCC) 02/20/2014    Current Outpatient Medications on File Prior to Visit  Medication Sig Dispense Refill   acetaminophen (TYLENOL) 500 MG tablet Take 1,000 mg by mouth every 8 (eight) hours as needed for moderate pain or mild pain.     albuterol (PROAIR HFA) 108 (90 Base) MCG/ACT inhaler Inhale  2 puffs into the lungs every 6 (six) hours as needed for wheezing or shortness of breath. 6.7 g 1   aspirin EC 81 MG tablet Take 1 tablet (81 mg total) by mouth daily. Swallow whole.  Resume in 2 weeks if hemoglobin remained stable     carvedilol (COREG) 3.125 MG tablet Take 1 tablet (3.125 mg total) by mouth 4 (four) times a week. Take twice daily on non Dialysis days (Tues,Thur,Sat, Sun) (Patient taking differently: Take 3.125 mg by mouth in the morning and at bedtime.) 96 tablet 3   cloNIDine (CATAPRES) 0.1 MG tablet Take 0.1 mg by mouth daily as needed (bp > 180/120).     ezetimibe (ZETIA) 10 MG tablet Take 1 tablet (10 mg total) by mouth daily. 90 tablet 3   hydrALAZINE (APRESOLINE) 50 MG tablet Take 1 tablet (50 mg total) by mouth 3 (three) times daily. Hold hydralazine before  dialysis, and take after dialysis if [facility] blood pressure>120     hydrOXYzine (ATARAX) 50 MG tablet Take 50 mg by mouth 3 (three) times daily as needed for anxiety.     insulin aspart (NOVOLOG) 100 UNIT/ML injection Inject 120 Units into the skin See admin instructions. Patient uses this with the Omnipod insulin pump  its vary according to blood sugar. Loads 120 units every 3 days. (Patient taking differently: Inject 150 Units into the skin See admin instructions. Patient uses this with the Omnipod insulin pump  its vary according to blood sugar. Loads 150 units every 3 days.)     Insulin Disposable Pump (OMNIPOD DASH PODS, GEN 4,) MISC Place 1 Applicatorful onto the skin See admin instructions. Every three days     isosorbide dinitrate (ISORDIL) 20 MG tablet Take 1 tablet (20 mg total) by mouth 3 (three) times daily. Please hold Imdur before HD, and take after the if systolic blood pressure>120     LORazepam (ATIVAN) 0.5 MG tablet Take 1 tablet (0.5 mg total) by mouth every 8 (eight) hours as needed for anxiety (Refractory nausea and vomiting). (Patient taking differently: Take 0.5 mg by mouth every 6 (six) hours as needed for anxiety (Refractory nausea and vomiting).) 30 tablet 0   Methoxy PEG-Epoetin Beta (MIRCERA IJ) as directed. Kidney clinic     metoCLOPramide (REGLAN) 5 MG tablet Take 5-10 mg by mouth every 8 (eight) hours as needed for vomiting or nausea.     nitroGLYCERIN (NITROSTAT) 0.4 MG SL tablet Place 1 tablet (0.4 mg total) under the tongue every 5 (five) minutes as needed for chest pain. 90 tablet 3   olmesartan (BENICAR) 20 MG tablet Take 20 mg by mouth daily.     pantoprazole (PROTONIX) 40 MG tablet Take 1 tablet (40 mg total) by mouth 2 (two) times daily. 60 tablet 1   polyethylene glycol (MIRALAX / GLYCOLAX) 17 g packet Take 17 g by mouth daily as needed for moderate constipation.     pravastatin (PRAVACHOL) 40 MG tablet Take 1 tablet (40 mg total) by mouth every evening. 90 tablet 3    pregabalin (LYRICA) 50 MG capsule Take 50 mg by mouth daily as needed (neuropathy).     prochlorperazine (COMPAZINE) 5 MG tablet Take 1 tablet (5 mg total) by mouth every 6 (six) hours as needed for nausea or vomiting. 15 tablet 0   scopolamine (TRANSDERM-SCOP) 1 MG/3DAYS Place 1 patch (1.5 mg total) onto the skin every 3 (three) days. 24 patch 4   tobramycin (TOBREX) 0.3 % ophthalmic solution Place 1 drop into  both eyes See admin instructions. Instill 1 drop into both eyes once every 8 weeks before eye injections     torsemide (DEMADEX) 20 MG tablet Take 20 mg by mouth daily as needed (swelling).     TRESIBA FLEXTOUCH 100 UNIT/ML FlexTouch Pen Inject into the skin as needed (when insulin pump is not working).     Vitamin D, Ergocalciferol, (DRISDOL) 1.25 MG (50000 UNIT) CAPS capsule Take 50,000 Units by mouth once a week.     VOQUEZNA 10 MG TABS Take 1 tablet by mouth daily.     Current Facility-Administered Medications on File Prior to Visit  Medication Dose Route Frequency Provider Last Rate Last Admin   alteplase (CATHFLO ACTIVASE) injection 2 mg  2 mg Intracatheter Once PRN Oretha Milch, PA-C       anticoagulant sodium citrate solution 5 mL  5 mL Intracatheter PRN Thomasena Edis, Samantha G, PA-C       heparin injection 1,000 Units  1,000 Units Intracatheter PRN Oretha Milch, PA-C   5,000 Units at 07/17/23 4696   lidocaine (PF) (XYLOCAINE) 1 % injection 5 mL  5 mL Intradermal PRN Oretha Milch, PA-C       lidocaine-prilocaine (EMLA) cream 1 Application  1 Application Topical PRN Collins, Quenten Raven, PA-C       pentafluoroprop-tetrafluoroeth (GEBAUERS) aerosol 1 Application  1 Application Topical PRN Oretha Milch, PA-C        Allergies  Allergen Reactions   Atorvastatin Other (See Comments)    Dizziness    Dulaglutide Nausea And Vomiting and Other (See Comments)    Caused pancreatitis    Ivp Dye [Iodinated Contrast Media] Hives, Itching and Nausea And Vomiting     tremors   Metoclopramide Other (See Comments)    Possible movement disorder or tardive dyskinesia while on reglan Long term   Other Itching    Peas and carrots   Sertraline Nausea And Vomiting   Vancomycin Itching and Swelling   Zofran [Ondansetron] Nausea And Vomiting and Other (See Comments)    Causes nausea vomiting to worsen / doesn't really work for patient.   Amoxicillin Itching and Rash   Lantus [Insulin Glargine] Itching and Rash    Has tolerated insulin glargine many times with no reported issues   Miconazole Nitrate Nausea And Vomiting and Rash    Assessment/Plan:  1. Hyperlipidemia -  No problems updated. No problem-specific Assessment & Plan notes found for this encounter.    Thank you,  Carmela Hurt, Pharm.D [facility] A Division of [facility] [address] [place], [place] [address]  Phone: [phone]; Fax: [phone]

## 2023-08-22 DIAGNOSIS — T8249XA Other complication of vascular dialysis catheter, initial encounter: Secondary | ICD-10-CM | POA: Diagnosis not present

## 2023-08-22 DIAGNOSIS — N186 End stage renal disease: Secondary | ICD-10-CM | POA: Diagnosis not present

## 2023-08-22 DIAGNOSIS — N2581 Secondary hyperparathyroidism of renal origin: Secondary | ICD-10-CM | POA: Diagnosis not present

## 2023-08-22 DIAGNOSIS — Z992 Dependence on renal dialysis: Secondary | ICD-10-CM | POA: Diagnosis not present

## 2023-08-25 DIAGNOSIS — N2581 Secondary hyperparathyroidism of renal origin: Secondary | ICD-10-CM | POA: Diagnosis not present

## 2023-08-25 DIAGNOSIS — Z992 Dependence on renal dialysis: Secondary | ICD-10-CM | POA: Diagnosis not present

## 2023-08-25 DIAGNOSIS — N186 End stage renal disease: Secondary | ICD-10-CM | POA: Diagnosis not present

## 2023-08-25 DIAGNOSIS — T8249XA Other complication of vascular dialysis catheter, initial encounter: Secondary | ICD-10-CM | POA: Diagnosis not present

## 2023-08-27 ENCOUNTER — Institutional Professional Consult (permissible substitution) (HOSPITAL_BASED_OUTPATIENT_CLINIC_OR_DEPARTMENT_OTHER): Payer: BC Managed Care – PPO | Admitting: Cardiovascular Disease

## 2023-08-27 DIAGNOSIS — Z992 Dependence on renal dialysis: Secondary | ICD-10-CM | POA: Diagnosis not present

## 2023-08-27 DIAGNOSIS — T8249XA Other complication of vascular dialysis catheter, initial encounter: Secondary | ICD-10-CM | POA: Diagnosis not present

## 2023-08-27 DIAGNOSIS — N2581 Secondary hyperparathyroidism of renal origin: Secondary | ICD-10-CM | POA: Diagnosis not present

## 2023-08-27 DIAGNOSIS — N186 End stage renal disease: Secondary | ICD-10-CM | POA: Diagnosis not present

## 2023-08-29 DIAGNOSIS — Z992 Dependence on renal dialysis: Secondary | ICD-10-CM | POA: Diagnosis not present

## 2023-08-29 DIAGNOSIS — N186 End stage renal disease: Secondary | ICD-10-CM | POA: Diagnosis not present

## 2023-08-29 DIAGNOSIS — T8249XA Other complication of vascular dialysis catheter, initial encounter: Secondary | ICD-10-CM | POA: Diagnosis not present

## 2023-08-29 DIAGNOSIS — N2581 Secondary hyperparathyroidism of renal origin: Secondary | ICD-10-CM | POA: Diagnosis not present

## 2023-09-01 ENCOUNTER — Telehealth: Payer: Self-pay

## 2023-09-01 ENCOUNTER — Other Ambulatory Visit: Payer: Self-pay

## 2023-09-01 DIAGNOSIS — Z992 Dependence on renal dialysis: Secondary | ICD-10-CM | POA: Diagnosis not present

## 2023-09-01 DIAGNOSIS — N186 End stage renal disease: Secondary | ICD-10-CM

## 2023-09-01 DIAGNOSIS — N2581 Secondary hyperparathyroidism of renal origin: Secondary | ICD-10-CM | POA: Diagnosis not present

## 2023-09-01 DIAGNOSIS — T8249XA Other complication of vascular dialysis catheter, initial encounter: Secondary | ICD-10-CM | POA: Diagnosis not present

## 2023-09-03 DIAGNOSIS — N2581 Secondary hyperparathyroidism of renal origin: Secondary | ICD-10-CM | POA: Diagnosis not present

## 2023-09-03 DIAGNOSIS — T8249XA Other complication of vascular dialysis catheter, initial encounter: Secondary | ICD-10-CM | POA: Diagnosis not present

## 2023-09-03 DIAGNOSIS — N186 End stage renal disease: Secondary | ICD-10-CM | POA: Diagnosis not present

## 2023-09-03 DIAGNOSIS — Z992 Dependence on renal dialysis: Secondary | ICD-10-CM | POA: Diagnosis not present

## 2023-09-04 NOTE — Telephone Encounter (Signed)
Opened in error

## 2023-09-05 DIAGNOSIS — Z992 Dependence on renal dialysis: Secondary | ICD-10-CM | POA: Diagnosis not present

## 2023-09-05 DIAGNOSIS — T8249XA Other complication of vascular dialysis catheter, initial encounter: Secondary | ICD-10-CM | POA: Diagnosis not present

## 2023-09-05 DIAGNOSIS — N186 End stage renal disease: Secondary | ICD-10-CM | POA: Diagnosis not present

## 2023-09-05 DIAGNOSIS — I129 Hypertensive chronic kidney disease with stage 1 through stage 4 chronic kidney disease, or unspecified chronic kidney disease: Secondary | ICD-10-CM | POA: Diagnosis not present

## 2023-09-05 DIAGNOSIS — N2581 Secondary hyperparathyroidism of renal origin: Secondary | ICD-10-CM | POA: Diagnosis not present

## 2023-09-08 DIAGNOSIS — N186 End stage renal disease: Secondary | ICD-10-CM | POA: Diagnosis not present

## 2023-09-08 DIAGNOSIS — N2581 Secondary hyperparathyroidism of renal origin: Secondary | ICD-10-CM | POA: Diagnosis not present

## 2023-09-08 DIAGNOSIS — T8249XA Other complication of vascular dialysis catheter, initial encounter: Secondary | ICD-10-CM | POA: Diagnosis not present

## 2023-09-08 DIAGNOSIS — Z992 Dependence on renal dialysis: Secondary | ICD-10-CM | POA: Diagnosis not present

## 2023-09-10 DIAGNOSIS — Z992 Dependence on renal dialysis: Secondary | ICD-10-CM | POA: Diagnosis not present

## 2023-09-10 DIAGNOSIS — N186 End stage renal disease: Secondary | ICD-10-CM | POA: Diagnosis not present

## 2023-09-10 DIAGNOSIS — T8249XA Other complication of vascular dialysis catheter, initial encounter: Secondary | ICD-10-CM | POA: Diagnosis not present

## 2023-09-10 DIAGNOSIS — N2581 Secondary hyperparathyroidism of renal origin: Secondary | ICD-10-CM | POA: Diagnosis not present

## 2023-09-12 DIAGNOSIS — N186 End stage renal disease: Secondary | ICD-10-CM | POA: Diagnosis not present

## 2023-09-12 DIAGNOSIS — Z992 Dependence on renal dialysis: Secondary | ICD-10-CM | POA: Diagnosis not present

## 2023-09-12 DIAGNOSIS — T8249XA Other complication of vascular dialysis catheter, initial encounter: Secondary | ICD-10-CM | POA: Diagnosis not present

## 2023-09-12 DIAGNOSIS — N2581 Secondary hyperparathyroidism of renal origin: Secondary | ICD-10-CM | POA: Diagnosis not present

## 2023-09-15 DIAGNOSIS — Z992 Dependence on renal dialysis: Secondary | ICD-10-CM | POA: Diagnosis not present

## 2023-09-15 DIAGNOSIS — T8249XA Other complication of vascular dialysis catheter, initial encounter: Secondary | ICD-10-CM | POA: Diagnosis not present

## 2023-09-15 DIAGNOSIS — N186 End stage renal disease: Secondary | ICD-10-CM | POA: Diagnosis not present

## 2023-09-15 DIAGNOSIS — N2581 Secondary hyperparathyroidism of renal origin: Secondary | ICD-10-CM | POA: Diagnosis not present

## 2023-09-17 ENCOUNTER — Encounter (HOSPITAL_COMMUNITY): Payer: Self-pay | Admitting: Vascular Surgery

## 2023-09-17 ENCOUNTER — Encounter: Payer: Self-pay | Admitting: Cardiovascular Disease

## 2023-09-17 ENCOUNTER — Other Ambulatory Visit: Payer: Self-pay

## 2023-09-17 ENCOUNTER — Telehealth: Payer: Self-pay

## 2023-09-17 DIAGNOSIS — N186 End stage renal disease: Secondary | ICD-10-CM | POA: Diagnosis not present

## 2023-09-17 DIAGNOSIS — T8249XA Other complication of vascular dialysis catheter, initial encounter: Secondary | ICD-10-CM | POA: Diagnosis not present

## 2023-09-17 DIAGNOSIS — N2581 Secondary hyperparathyroidism of renal origin: Secondary | ICD-10-CM | POA: Diagnosis not present

## 2023-09-17 DIAGNOSIS — Z992 Dependence on renal dialysis: Secondary | ICD-10-CM | POA: Diagnosis not present

## 2023-09-17 NOTE — Progress Notes (Addendum)
Case: [id number] Date/Time: 09/18/23 0715   Procedure: INSERTION OF ARTERIOVENOUS (AV) GORE-TEX GRAFT THIGH (Left)   Anesthesia type: Monitor Anesthesia Care   Pre-op diagnosis: End Stage Renal Disease   [facility] ROOM 11 / [facility]   Surgeons: Leonie Douglas, MD       DISCUSSION: [name] is a 53 year old female who is a SDW prior to surgery above. PMH of HTN, hx of NSTEMI (2023), hx cardiac arrest with QT prolongation s/p ICD in 2023, NICM, CHF, asthma, migraines, PUD/gastritis, GERD, T1DM c/b neuropathy, gastroparesis, ESRD on hemodialysis M/W/F, anxiety, depression, arthritis, anemia.  Prior anesthesia complication include PONV  Patient has frequent admissions for intractable nausea and vomiting secondary to diabetic gastroparesis. Last admitted for this from 12/9 to 12/13. EGD was done on 07/15/2023 and showed gastritis, esophagitis, gastric and esophageal ulcer with bleeding. She also underwent 2nd RUE AV graft placement on 12/12 by Dr. Blase Mess. She previously had a LUE graft and a different RUE graft but both have thrombosed. At her vascular f/u on 08/12/23 her new RUE graft noted to be occluded. Now scheduled for surgery above. She is currently dialyzing through internal jugular catheter.  Follows with cardiology for history of cardiac arrest on 04/05/2022, NSTEMI, NICM, CHF. She is s/p single-chamber ICD on 04/09/22, and HTN. Coronary CTA 01/2020 showed coronary calcium score of 0 and no evidence of CAD. She was last seen by cardiologist Dr. Raynelle Jan on 06/17/23. Noted to have worsening CHF - last echo on 06/16/23 showed EF is down to 35-40%. She is on optimally titrated GDMT. Per Dr. Raynelle Jan: "Limited treatment options due to dialysis and potential use of barostem to stimulate the nervous system and strengthen heart function." She was referred to advanced HF team. Advised to f/u in 6 months, f/u scheduled for March 2025. She was cleared prior to her RUE graft placement on  06/17/23: "Though her cardiac risks are high she is well compensated and managed from a cardiac perspective. This is a non-elevate procedure; she has ESRD. I recommend proceeding with surgery."  History of type 1 diabetes, last A1c 7.9 on 07/15/23   Patient will need day of surgery labs and evaluation.  Electrophysiologist's Recommendations:   Have magnet available. Provide continuous ECG monitoring when magnet is used or reprogramming is to be performed.  Procedure may interfere with device function.  Magnet should be placed over device during procedure.    PROVIDERSDelma Officer, [facility]: Christell Constant, MD   IMAGES:  CXR 07/14/23:  FINDINGS: Central line with tip at the upper right atrium. Single chamber ICD lead into the right ventricle. There is no edema, consolidation, effusion, or pneumothorax. Normal heart size and mediastinal contours.   IMPRESSION: No evidence of active disease.  EKG 07/14/23:  Sinus tachycardia,rate 117 Ventricular premature complex Probable left atrial enlargement Probable LVH with secondary repol abnrm No significant change since last tracing  CV:  TTE 06/16/23: IMPRESSIONS     1. Left ventricular ejection fraction, by estimation, is 35 to 40%. The  left ventricle has moderately decreased function. The left ventricle  demonstrates global hypokinesis. Left ventricular diastolic parameters are  consistent with Grade II diastolic  dysfunction (pseudonormalization). Elevated left ventricular end-diastolic  pressure. The average left ventricular global longitudinal strain is -8.9  %. The global longitudinal strain is abnormal.   2. Right ventricular systolic function is normal. The right ventricular  size is normal. There is normal pulmonary artery systolic pressure.   3. Left atrial  size was severely dilated.   4. The mitral valve is degenerative. Trivial mitral valve regurgitation.  No evidence of mitral stenosis.    5. The aortic valve is tricuspid. Aortic valve regurgitation is not  visualized. No aortic stenosis is present.   6. The inferior vena cava is normal in size with greater than 50%  respiratory variability, suggesting right atrial pressure of 3 mmHg.   Comparison(s): 10/05/22-EF 40-45%.   Stress test 06/02/23:    Findings are consistent with no ischemia. The study is high risk.   LV perfusion is abnormal. Defect 1: There is a small defect with mild reduction in uptake present in the apical apex location(s) that is fixed. There is normal wall motion in the defect area. Consistent with artifact.   Left ventricular function is abnormal. Global function is severely reduced. Nuclear stress EF: 27%. The left ventricular ejection fraction is severely decreased (<30%). End diastolic cavity size is mildly enlarged. End systolic cavity size is mildly enlarged.   Prior study available for comparison from 01/20/2013. There are changes compared to prior study. The left ventricular ejection fraction has decreased. Normal perfusion, LVEF 42%   Small size, mild intensity fixed apical perfusion defect, likely thinning artifact. No significant reversible ischemia. LVEF 27% with global hypokinesis. This is a high risk study (due to reduced LV function). Compared to a prior study in 2014, the LVEF is significantly lower.  Coronary CT 01/19/2020: IMPRESSION: 1. Coronary calcium score of 0.   2. Normal coronary origin with right dominance.   3. Normal coronary arteries.   4. Severe biatrial enlargement.   Past Medical History:  Diagnosis Date   AICD (automatic cardioverter/defibrillator) present    Medtronic   Anemia    Anxiety    Arthritis    Asthma    CHF (congestive heart failure) (HCC)    Chronic diastolic CHF (congestive heart failure) (HCC) 12/14/2019   Depression    Diabetic ulcer of left foot (HCC) 05/12/2013   ESRD on hemodialysis (HCC)    MWF at [facility]   Gastroparesis    Generalized  abdominal pain    History of chicken pox    Loss of weight 12/02/2019   Migraines    Mixed hyperlipidemia 09/05/2022   Mood disorder (HCC)    anxiety   Myocardial infarction (HCC)    NSTEMI (non-ST elevated myocardial infarction) (HCC) 05/13/2022   Prurigo nodularis    with diabetic dermopathy   Type 1 diabetes mellitus (HCC) 05/23/2022   Type 1 diabetes, uncontrolled, with neuropathy    Phadke   Ulcers of both lower legs (HCC) 02/20/2014    Past Surgical History:  Procedure Laterality Date   AV FISTULA PLACEMENT Left 10/01/2022   Procedure: ARTERIOVENOUS (AV)FISTULA CREATION;  Surgeon: Larina Earthly, MD;  Location: [facility]S;  Service: Vascular;  Laterality: Left;   AV FISTULA PLACEMENT Right 11/19/2022   Procedure: INSERTION OF RIGHT ARM ARTERIOVENOUS (AV) GORE-TEX GRAFT;  Surgeon: Chuck Hint, MD;  Location: [facility] OR;  Service: Vascular;  Laterality: Right;   AV FISTULA PLACEMENT Right 07/17/2023   Procedure: INSERTION OF ARTERIOVENOUS (AV) GOR-TEX GRAFT RIGHT ARM;  Surgeon: Leonie Douglas, MD;  Location[facility] OR;  Service: Vascular;  Laterality: Right;   BIOPSY  08/11/2020   Procedure: BIOPSY;  Surgeon: Meryl Dare, MD;  Location: [facility] ENDOSCOPY;  Service: Endoscopy;;   BIOPSY  04/02/2022   Procedure: BIOPSY;  Surgeon: Lynann Bologna, MD;  Location: [facility] ENDOSCOPY;  Service: Gastroenterology;;  ESOPHAGOGASTRODUODENOSCOPY (EGD) WITH PROPOFOL N/A 08/11/2020   Procedure: ESOPHAGOGASTRODUODENOSCOPY (EGD) WITH PROPOFOL;  Surgeon: Meryl Dare, MD;  Location: [facility];  Service: Endoscopy;  Laterality: N/A;   ESOPHAGOGASTRODUODENOSCOPY (EGD) WITH PROPOFOL N/A 04/02/2022   Procedure: ESOPHAGOGASTRODUODENOSCOPY (EGD) WITH PROPOFOL;  Surgeon: Lynann Bologna, MD;  [facility]: [facility];  Service: Gastroenterology;  Laterality: N/A;   ESOPHAGOGASTRODUODENOSCOPY (EGD) WITH PROPOFOL N/A 05/23/2022   Procedure: ESOPHAGOGASTRODUODENOSCOPY (EGD) WITH PROPOFOL;  Surgeon: Midge Minium, MD;   Location: [facility] ENDOSCOPY;  Service: Endoscopy;  Laterality: N/A;   ESOPHAGOGASTRODUODENOSCOPY (EGD) WITH PROPOFOL N/A 07/15/2023   Procedure: ESOPHAGOGASTRODUODENOSCOPY (EGD) WITH PROPOFOL;  Surgeon: Charlott Rakes, MD;  Location: [facility] ENDOSCOPY;  Service: Gastroenterology;  Laterality: N/A;   ICD IMPLANT N/A 04/09/2022   Procedure: ICD IMPLANT;  Surgeon: [name], MD;  Location: [facility] INVASIVE CV LAB;  Service: Cardiovascular;  Laterality: N/A;   IR FLUORO GUIDE CV LINE RIGHT  10/07/2022   IR US GUIDE VASC ACCESS RIGHT  10/07/2022   treadmill stress test  01/2013   WNL, low risk study    MEDICATIONS: No current facility-administered medications for this encounter.    acetaminophen (TYLENOL) 500 MG tablet   albuterol (PROAIR HFA) 108 (90 Base) MCG/ACT inhaler   carvedilol (COREG) 3.125 MG tablet   cloNIDine (CATAPRES) 0.1 MG tablet   furosemide (LASIX) 40 MG tablet   gabapentin (NEURONTIN) 300 MG capsule   hydrALAZINE (APRESOLINE) 50 MG tablet   hydrOXYzine (ATARAX) 50 MG tablet   insulin aspart (NOVOLOG) 100 UNIT/ML injection   Insulin Disposable Pump (OMNIPOD DASH PODS, GEN 4,) MISC   isosorbide dinitrate (ISORDIL) 20 MG tablet   LORazepam (ATIVAN) 0.5 MG tablet   metoCLOPramide (REGLAN) 10 MG tablet   nitroGLYCERIN (NITROSTAT) 0.4 MG SL tablet   pantoprazole (PROTONIX) 40 MG tablet   polyethylene glycol (MIRALAX / GLYCOLAX) 17 g packet   pregabalin (LYRICA) 50 MG capsule   prochlorperazine (COMPAZINE) 5 MG tablet   RABEprazole (ACIPHEX) 20 MG tablet   tobramycin (TOBREX) 0.3 % ophthalmic solution   TRESIBA FLEXTOUCH 100 UNIT/ML FlexTouch Pen   Methoxy PEG-Epoetin Beta (MIRCERA IJ)    alteplase (CATHFLO ACTIVASE) injection 2 mg   anticoagulant sodium citrate solution 5 mL   heparin injection 1,000 Units   lidocaine (PF) (XYLOCAINE) 1 % injection 5 mL   lidocaine-prilocaine (EMLA) cream 1 Application   pentafluoroprop-tetrafluoroeth (GEBAUERS) aerosol 1 Application     Ubaldo Glassing, PA-C [facility] Surgical Short Stay/Anesthesiology [facility] Phone [phone] 09/17/2023 11:42 AM

## 2023-09-17 NOTE — Anesthesia Preprocedure Evaluation (Signed)
Anesthesia Evaluation  Patient identified by MRN, date of birth, ID band Patient awake    Reviewed: Allergy & Precautions, NPO status , Patient's Chart, lab work & pertinent test results, reviewed documented beta blocker date and time   Airway Mallampati: II  TM Distance: >3 FB Neck ROM: Full    Dental no notable dental hx. (+) Missing, Chipped, Dental Advisory Given, Caps   Pulmonary asthma , pneumonia, resolved   Pulmonary exam normal breath sounds clear to auscultation       Cardiovascular hypertension, Pt. on medications + CAD, + Past MI and +CHF (wrEF)  Normal cardiovascular exam+ Cardiac Defibrillator  Rhythm:Regular Rate:Normal  EKG 07/14/23 Sinus Tachycardia Ventricular premature complex Probable left atrial enlargement Probable LVH with secondary repol abnormality  Echo 06/16/23 1. Left ventricular ejection fraction, by estimation, is 35 to 40%. The  left ventricle has moderately decreased function. The left ventricle  demonstrates global hypokinesis. Left ventricular diastolic parameters are  consistent with Grade II diastolic  dysfunction (pseudonormalization). Elevated left ventricular end-diastolic  pressure. The average left ventricular global longitudinal strain is -8.9  %. The global longitudinal strain is abnormal.   2. Right ventricular systolic function is normal. The right ventricular  size is normal. There is normal pulmonary artery systolic pressure.   3. Left atrial size was severely dilated.   4. The mitral valve is degenerative. Trivial mitral valve regurgitation.  No evidence of mitral stenosis.   5. The aortic valve is tricuspid. Aortic valve regurgitation is not  visualized. No aortic stenosis is present.   6. The inferior vena cava is normal in size with greater than 50%  respiratory variability, suggesting right atrial pressure of 3 mmHg.      Neuro/Psych  Headaches PSYCHIATRIC DISORDERS  Anxiety Depression    ClaustrophobiaDiabetic neuropathy    GI/Hepatic Neg liver ROS,,,Gastroparesis Hematemesis N/V   Endo/Other  diabetes, Poorly Controlled, Type 1, Insulin Dependent  Hyperlipidemia  Renal/GU ESRF and DialysisRenal diseaseLast dialysis this am  negative genitourinary   Musculoskeletal  (+) Arthritis , Osteoarthritis,    Abdominal   Peds  Hematology  (+) Blood dyscrasia, anemia   Anesthesia Other Findings All: see list  Reproductive/Obstetrics                              Anesthesia Physical Anesthesia Plan  ASA: 4  Anesthesia Plan: MAC   Post-op Pain Management:    Induction:   PONV Risk Score and Plan: Ondansetron, Propofol infusion and Treatment may vary due to age or medical condition  Airway Management Planned: Mask and Natural Airway  Additional Equipment: None  Intra-op Plan:   Post-operative Plan:   Informed Consent: I have reviewed the patients History and Physical, chart, labs and discussed the procedure including the risks, benefits and alternatives for the proposed anesthesia with the patient or authorized representative who has indicated his/her understanding and acceptance.     Dental advisory given  Plan Discussed with: CRNA and Anesthesiologist  Anesthesia Plan Comments:          Anesthesia Quick Evaluation

## 2023-09-17 NOTE — Telephone Encounter (Signed)
Outreach made to Pt.  Pt has surgery scheduled for 09/18/2023.  This has been approved.  Pt missed a recent transmission.  She states when she tried to send it she got some numbers on her device.  (Probably an error code related to handheld).  Advised Pt this nurse would follow up with her next week to troubleshoot her monitor.  Pt also due for follow up.  Per Pt she has dialysis M, W, F.  Prefers a Tuesday or Thursday appointment in the AM.  Advised Pt to focus on upcoming surgery, and this nurse would follow up next week to give her a chance to recover.  Pt thanked nurse for assistance.

## 2023-09-17 NOTE — Progress Notes (Signed)
PERIOPERATIVE PRESCRIPTION FOR IMPLANTED CARDIAC DEVICE PROGRAMMING  Patient Information: Name:  [name]  DOB:  18-Nov-1970  MRN:  [id number]  Planned Procedure:  Left Thigh Arteriovenous graft  Surgeon:  Dr. Heath Lark  Date of Procedure:  09/18/2023  Cautery will be used.  Position during surgery:  Supine   Device Information:  Clinic EP Physician:  Dr. York Pellant  Device Type:  Defibrillator Manufacturer and Phone #:  Medtronic: [id number] Pacemaker Dependent?:  No. Date of Last Device Check:  05/06/2023 Normal Device Function?:  Yes.    Electrophysiologist's Recommendations:  Have magnet available. Provide continuous ECG monitoring when magnet is used or reprogramming is to be performed.  Procedure may interfere with device function.  Magnet should be placed over device during procedure.  Per Device Clinic Standing Orders, Wiliam Ke, RN  12:58 PM 09/17/2023

## 2023-09-17 NOTE — Progress Notes (Signed)
SDW call  Patient was given pre-op instructions over the phone. Patient verbalized understanding of instructions provided.     PCP - Eliane Decree, PA Cardiologist - Dr. Riley Lam EP Cardiologist: Dr. York Pellant Pulmonary:    PPM/ICD - AICD, Medtronic Device Orders - requested Rep Notified -    Chest x-ray - 07/14/2023 EKG -  07/15/2023 Stress Test - 06/02/2023 ECHO - 06/15/2020 Cardiac Cath -   Sleep Study/sleep apnea/CPAP: denies  Type I diabetic.  CGM, dexcom 7.  Fasting Blood sugar range: 150-156 How often check sugars: continuous Insulin pump, instructed patient to reduce basal rate 20% at midnight   Blood Thinner Instructions: denies Aspirin Instructions:denies   ERAS Protcol - NPO   Anesthesia review: Yes.  DM type 1, MI, CHF, has insulin pump, dialysis M,W,F   Patient denies shortness of breath, fever, cough and chest pain over the phone call  Your procedure is scheduled on Thursday September 18, 2023  Report to [facility] Main Entrance "A" at  0530  A.M., then check in with the Admitting office.  Call this number if you have problems the morning of surgery:  [phone]   If you have any questions prior to your surgery date call [phone]: Open Monday-Friday 8am-4pm If you experience any cold or flu symptoms such as cough, fever, chills, shortness of breath, etc. between now and your scheduled surgery, please notify us at the above number    Remember:  Do not eat or drink after midnight the night before your surgery  Take these medicines the morning of surgery with A SIP OF WATER:  Carvedilol, hydralazine, isosorbide, protonix, aciphex  As needed: Tylenol, albuterol, clonidine, hydroxyzine, ativan, reglan, nitroglycerine, lyrica, compazine  As of today, STOP taking any Aspirin (unless otherwise instructed by your surgeon) Aleve, Naproxen, Ibuprofen, Motrin, Advil, Goody's, BC's, all herbal medications, fish oil, and all vitamins.

## 2023-09-18 ENCOUNTER — Ambulatory Visit (HOSPITAL_COMMUNITY)
Admission: RE | Admit: 2023-09-18 | Discharge: 2023-09-18 | Disposition: A | Discharge status: Home / Self Care | Payer: BC Managed Care – PPO | Attending: Vascular Surgery | Admitting: Vascular Surgery

## 2023-09-18 ENCOUNTER — Other Ambulatory Visit: Payer: Self-pay

## 2023-09-18 ENCOUNTER — Ambulatory Visit (HOSPITAL_COMMUNITY): Payer: Self-pay | Admitting: Medical

## 2023-09-18 ENCOUNTER — Other Ambulatory Visit (HOSPITAL_COMMUNITY): Payer: Self-pay

## 2023-09-18 ENCOUNTER — Encounter (HOSPITAL_COMMUNITY)
Admission: RE | Disposition: A | Discharge status: Home / Self Care | Payer: Self-pay | Source: Home / Self Care | Attending: Vascular Surgery

## 2023-09-18 ENCOUNTER — Encounter (HOSPITAL_COMMUNITY): Payer: Self-pay | Admitting: Vascular Surgery

## 2023-09-18 DIAGNOSIS — I132 Hypertensive heart and chronic kidney disease with heart failure and with stage 5 chronic kidney disease, or end stage renal disease: Secondary | ICD-10-CM | POA: Diagnosis not present

## 2023-09-18 DIAGNOSIS — E1022 Type 1 diabetes mellitus with diabetic chronic kidney disease: Secondary | ICD-10-CM | POA: Diagnosis not present

## 2023-09-18 DIAGNOSIS — I5032 Chronic diastolic (congestive) heart failure: Secondary | ICD-10-CM | POA: Insufficient documentation

## 2023-09-18 DIAGNOSIS — N185 Chronic kidney disease, stage 5: Secondary | ICD-10-CM | POA: Diagnosis not present

## 2023-09-18 DIAGNOSIS — K219 Gastro-esophageal reflux disease without esophagitis: Secondary | ICD-10-CM | POA: Insufficient documentation

## 2023-09-18 DIAGNOSIS — N186 End stage renal disease: Secondary | ICD-10-CM | POA: Insufficient documentation

## 2023-09-18 DIAGNOSIS — Z992 Dependence on renal dialysis: Secondary | ICD-10-CM | POA: Diagnosis not present

## 2023-09-18 DIAGNOSIS — K3184 Gastroparesis: Secondary | ICD-10-CM | POA: Insufficient documentation

## 2023-09-18 DIAGNOSIS — Z95810 Presence of automatic (implantable) cardiac defibrillator: Secondary | ICD-10-CM | POA: Diagnosis not present

## 2023-09-18 DIAGNOSIS — F419 Anxiety disorder, unspecified: Secondary | ICD-10-CM | POA: Diagnosis not present

## 2023-09-18 DIAGNOSIS — E782 Mixed hyperlipidemia: Secondary | ICD-10-CM | POA: Diagnosis not present

## 2023-09-18 DIAGNOSIS — E1043 Type 1 diabetes mellitus with diabetic autonomic (poly)neuropathy: Secondary | ICD-10-CM | POA: Diagnosis not present

## 2023-09-18 DIAGNOSIS — I428 Other cardiomyopathies: Secondary | ICD-10-CM | POA: Insufficient documentation

## 2023-09-18 DIAGNOSIS — I252 Old myocardial infarction: Secondary | ICD-10-CM | POA: Insufficient documentation

## 2023-09-18 DIAGNOSIS — E1065 Type 1 diabetes mellitus with hyperglycemia: Secondary | ICD-10-CM | POA: Insufficient documentation

## 2023-09-18 DIAGNOSIS — Z794 Long term (current) use of insulin: Secondary | ICD-10-CM | POA: Insufficient documentation

## 2023-09-18 DIAGNOSIS — Z8674 Personal history of sudden cardiac arrest: Secondary | ICD-10-CM | POA: Diagnosis not present

## 2023-09-18 DIAGNOSIS — Z8711 Personal history of peptic ulcer disease: Secondary | ICD-10-CM | POA: Diagnosis not present

## 2023-09-18 DIAGNOSIS — I2510 Atherosclerotic heart disease of native coronary artery without angina pectoris: Secondary | ICD-10-CM | POA: Insufficient documentation

## 2023-09-18 HISTORY — PX: AV FISTULA PLACEMENT: SHX1204

## 2023-09-18 LAB — POCT I-STAT, CHEM 8
BUN: 42 mg/dL — ABNORMAL HIGH (ref 6–20)
Calcium, Ion: 1.11 mmol/L — ABNORMAL LOW (ref 1.15–1.40)
Chloride: 95 mmol/L — ABNORMAL LOW (ref 98–111)
Creatinine, Ser: 6.9 mg/dL — ABNORMAL HIGH (ref 0.44–1.00)
Glucose, Bld: 311 mg/dL — ABNORMAL HIGH (ref 70–99)
HCT: 33 % — ABNORMAL LOW (ref 36.0–46.0)
Hemoglobin: 11.2 g/dL — ABNORMAL LOW (ref 12.0–15.0)
Potassium: 4.1 mmol/L (ref 3.5–5.1)
Sodium: 134 mmol/L — ABNORMAL LOW (ref 135–145)
TCO2: 29 mmol/L (ref 22–32)

## 2023-09-18 LAB — GLUCOSE, CAPILLARY
Glucose-Capillary: 306 mg/dL — ABNORMAL HIGH (ref 70–99)
Glucose-Capillary: 346 mg/dL — ABNORMAL HIGH (ref 70–99)
Glucose-Capillary: 288 mg/dL — ABNORMAL HIGH (ref 70–99)
Glucose-Capillary: 249 mg/dL — ABNORMAL HIGH (ref 70–99)

## 2023-09-18 SURGERY — INSERTION OF ARTERIOVENOUS (AV) GORE-TEX GRAFT THIGH
Anesthesia: General | Laterality: Left

## 2023-09-18 MED ORDER — FENTANYL CITRATE (PF) 100 MCG/2ML IJ SOLN: 25.0000 ug | INTRAMUSCULAR | Status: DC | PRN

## 2023-09-18 MED ORDER — FENTANYL CITRATE (PF) 250 MCG/5ML IJ SOLN
INTRAMUSCULAR | Status: DC | PRN
  Administered 2023-09-18: 100 ug via INTRAVENOUS
  Administered 2023-09-18: 25 ug via INTRAVENOUS

## 2023-09-18 MED ORDER — ROCURONIUM BROMIDE 10 MG/ML (PF) SYRINGE
PREFILLED_SYRINGE | INTRAVENOUS | Status: DC | PRN
  Administered 2023-09-18: 50 mg via INTRAVENOUS

## 2023-09-18 MED ORDER — INSULIN ASPART 100 UNIT/ML IJ SOLN
5.0000 [IU] | Freq: Once | INTRAMUSCULAR | Status: AC
  Administered 2023-09-18: 5 [IU] via SUBCUTANEOUS

## 2023-09-18 MED ORDER — MIDAZOLAM HCL 2 MG/2ML IJ SOLN
INTRAMUSCULAR | Status: AC
  Filled 2023-09-18: qty 2

## 2023-09-18 MED ORDER — CHLORHEXIDINE GLUCONATE 0.12 % MT SOLN: 15.0000 mL | Freq: Once | OROMUCOSAL | Status: AC

## 2023-09-18 MED ORDER — MIDAZOLAM HCL 2 MG/2ML IJ SOLN
INTRAMUSCULAR | Status: DC | PRN
  Administered 2023-09-18: 2 mg via INTRAVENOUS

## 2023-09-18 MED ORDER — INSULIN ASPART 100 UNIT/ML IJ SOLN
INTRAMUSCULAR | Status: AC
  Filled 2023-09-18: qty 1

## 2023-09-18 MED ORDER — SUGAMMADEX SODIUM 200 MG/2ML IV SOLN
INTRAVENOUS | Status: DC | PRN
Start: 2023-09-18 — End: 2023-09-18
  Administered 2023-09-18: 141.6 mg via INTRAVENOUS

## 2023-09-18 MED ORDER — HEPARIN 6000 UNIT IRRIGATION SOLUTION
Status: AC
  Filled 2023-09-18: qty 500

## 2023-09-18 MED ORDER — SODIUM CHLORIDE 0.9% FLUSH: 3.0000 mL | Freq: Two times a day (BID) | INTRAVENOUS | Status: DC

## 2023-09-18 MED ORDER — ACETAMINOPHEN 10 MG/ML IV SOLN
INTRAVENOUS | Status: DC | PRN
  Administered 2023-09-18: 1000 mg via INTRAVENOUS

## 2023-09-18 MED ORDER — STERILE WATER FOR IRRIGATION IR SOLN
Status: DC | PRN
Start: 2023-09-18 — End: 2023-09-18
  Administered 2023-09-18: 1000 mL

## 2023-09-18 MED ORDER — ORAL CARE MOUTH RINSE: 15.0000 mL | Freq: Once | OROMUCOSAL | Status: AC

## 2023-09-18 MED ORDER — FENTANYL CITRATE (PF) 250 MCG/5ML IJ SOLN
INTRAMUSCULAR | Status: AC
  Filled 2023-09-18: qty 5

## 2023-09-18 MED ORDER — CHLORHEXIDINE GLUCONATE 0.12 % MT SOLN
OROMUCOSAL | Status: AC
Start: 2023-09-18 — End: 2023-09-18
  Administered 2023-09-18: 15 mL via OROMUCOSAL
  Filled 2023-09-18: qty 15

## 2023-09-18 MED ORDER — HEMOSTATIC AGENTS (NO CHARGE) OPTIME
TOPICAL | Status: DC | PRN
  Administered 2023-09-18: 1 via TOPICAL

## 2023-09-18 MED ORDER — PROPOFOL 10 MG/ML IV BOLUS
INTRAVENOUS | Status: DC | PRN
  Administered 2023-09-18: 100 mg via INTRAVENOUS

## 2023-09-18 MED ORDER — SODIUM CHLORIDE 0.9% FLUSH: 3.0000 mL | INTRAVENOUS | Status: DC | PRN

## 2023-09-18 MED ORDER — CHLORHEXIDINE GLUCONATE 4 % EX SOLN: 60.0000 mL | Freq: Once | CUTANEOUS | Status: DC

## 2023-09-18 MED ORDER — OXYCODONE HCL 5 MG/5ML PO SOLN: 5.0000 mg | Freq: Once | ORAL | Status: DC | PRN

## 2023-09-18 MED ORDER — 0.9 % SODIUM CHLORIDE (POUR BTL) OPTIME
TOPICAL | Status: DC | PRN
  Administered 2023-09-18: 1000 mL

## 2023-09-18 MED ORDER — HEPARIN 6000 UNIT IRRIGATION SOLUTION
Status: DC | PRN
  Administered 2023-09-18: 1

## 2023-09-18 MED ORDER — OXYCODONE-ACETAMINOPHEN 5-325 MG PO TABS
1.0000 | ORAL_TABLET | Freq: Four times a day (QID) | ORAL | 0 refills | Status: DC | PRN
  Filled 2023-09-18: qty 20, 5d supply, fill #0

## 2023-09-18 MED ORDER — PHENYLEPHRINE HCL-NACL 20-0.9 MG/250ML-% IV SOLN
INTRAVENOUS | Status: DC | PRN
  Administered 2023-09-18: 20 ug/min via INTRAVENOUS

## 2023-09-18 MED ORDER — SODIUM CHLORIDE 0.9% FLUSH
3.0000 mL | INTRAVENOUS | Status: DC | PRN
Start: 2023-09-18 — End: 2023-09-18

## 2023-09-18 MED ORDER — PHENYLEPHRINE 80 MCG/ML (10ML) SYRINGE FOR IV PUSH (FOR BLOOD PRESSURE SUPPORT)
PREFILLED_SYRINGE | INTRAVENOUS | Status: DC | PRN
  Administered 2023-09-18 (×2): 80 ug via INTRAVENOUS

## 2023-09-18 MED ORDER — ONDANSETRON HCL 4 MG/2ML IJ SOLN
INTRAMUSCULAR | Status: AC
  Filled 2023-09-18: qty 2

## 2023-09-18 MED ORDER — ONDANSETRON HCL 4 MG/2ML IJ SOLN
4.0000 mg | Freq: Once | INTRAMUSCULAR | Status: AC
  Administered 2023-09-18: 4 mg via INTRAVENOUS

## 2023-09-18 MED ORDER — CIPROFLOXACIN IN D5W 400 MG/200ML IV SOLN
400.0000 mg | INTRAVENOUS | Status: AC
  Administered 2023-09-18: 400 mg via INTRAVENOUS
  Filled 2023-09-18: qty 200

## 2023-09-18 MED ORDER — LIDOCAINE 2% (20 MG/ML) 5 ML SYRINGE
INTRAMUSCULAR | Status: DC | PRN
  Administered 2023-09-18: 100 mg via INTRAVENOUS

## 2023-09-18 MED ORDER — ACETAMINOPHEN 10 MG/ML IV SOLN
INTRAVENOUS | Status: AC
  Filled 2023-09-18: qty 100

## 2023-09-18 MED ORDER — DROPERIDOL 2.5 MG/ML IJ SOLN
0.6250 mg | Freq: Once | INTRAMUSCULAR | Status: AC | PRN
  Administered 2023-09-18: 0.625 mg via INTRAVENOUS

## 2023-09-18 MED ORDER — DROPERIDOL 2.5 MG/ML IJ SOLN
INTRAMUSCULAR | Status: AC
  Filled 2023-09-18: qty 2

## 2023-09-18 MED ORDER — SODIUM CHLORIDE 0.9 % IV SOLN: INTRAVENOUS | Status: DC | PRN

## 2023-09-18 MED ORDER — ACETAMINOPHEN 10 MG/ML IV SOLN: 1000.0000 mg | Freq: Once | INTRAVENOUS | Status: DC | PRN

## 2023-09-18 MED ORDER — HEPARIN SODIUM (PORCINE) 1000 UNIT/ML IJ SOLN
INTRAMUSCULAR | Status: DC | PRN
  Administered 2023-09-18: 7000 [IU] via INTRAVENOUS

## 2023-09-18 MED ORDER — OXYCODONE HCL 5 MG PO TABS: 5.0000 mg | ORAL_TABLET | Freq: Once | ORAL | Status: DC | PRN

## 2023-09-18 SURGICAL SUPPLY — 46 items
ARMBAND PINK RESTRICT EXTREMIT (MISCELLANEOUS) ×2 IMPLANT
BENZOIN TINCTURE PRP APPL 2/3 (GAUZE/BANDAGES/DRESSINGS) ×2 IMPLANT
CANISTER SUCT 3000ML PPV (MISCELLANEOUS) ×2 IMPLANT
CANNULA VESSEL 3MM 2 BLNT TIP (CANNULA) ×2 IMPLANT
CHLORAPREP W/TINT 26 (MISCELLANEOUS) ×2 IMPLANT
CLIP LIGATING EXTRA MED SLVR (CLIP) ×2 IMPLANT
CLIP LIGATING EXTRA SM BLUE (MISCELLANEOUS) ×2 IMPLANT
DRAPE INCISE IOBAN 66X45 STRL (DRAPES) IMPLANT
DRAPE SURG ORHT 6 SPLT 77X108 (DRAPES) IMPLANT
DRSG IV TEGADERM 3.5X4.5 STRL (GAUZE/BANDAGES/DRESSINGS) IMPLANT
DRSG TEGADERM 2-3/8X2-3/4 SM (GAUZE/BANDAGES/DRESSINGS) IMPLANT
DRSG TEGADERM 4X4.75 (GAUZE/BANDAGES/DRESSINGS) IMPLANT
ELECT REM PT RETURN 9FT ADLT (ELECTROSURGICAL) ×1
ELECTRODE REM PT RTRN 9FT ADLT (ELECTROSURGICAL) ×2 IMPLANT
GAUZE SPONGE 4X4 12PLY STRL (GAUZE/BANDAGES/DRESSINGS) IMPLANT
GAUZE SPONGE 4X4 16PLY XRAY LF (GAUZE/BANDAGES/DRESSINGS) IMPLANT
GLOVE BIO SURGEON STRL SZ 6.5 (GLOVE) IMPLANT
GLOVE BIO SURGEON STRL SZ8 (GLOVE) ×2 IMPLANT
GLOVE BIOGEL PI IND STRL 6.5 (GLOVE) IMPLANT
GLOVE SURG SS PI 6.5 STRL IVOR (GLOVE) IMPLANT
GOWN STRL REUS W/ TWL LRG LVL3 (GOWN DISPOSABLE) ×4 IMPLANT
GOWN STRL REUS W/ TWL XL LVL3 (GOWN DISPOSABLE) ×2 IMPLANT
GRAFT GORETEX STRETCH 6X40 (Vascular Products) IMPLANT
HEMOSTAT SNOW SURGICEL 2X4 (HEMOSTASIS) IMPLANT
KIT BASIN OR (CUSTOM PROCEDURE TRAY) ×2 IMPLANT
KIT TURNOVER KIT B (KITS) ×2 IMPLANT
LOOP VESSEL MINI RED (MISCELLANEOUS) IMPLANT
NDL 18GX1X1/2 (RX/OR ONLY) (NEEDLE) IMPLANT
NEEDLE 18GX1X1/2 (RX/OR ONLY) (NEEDLE)
NS IRRIG 1000ML POUR BTL (IV SOLUTION) ×2 IMPLANT
PACK CV ACCESS (CUSTOM PROCEDURE TRAY) ×2 IMPLANT
PAD ARMBOARD 7.5X6 YLW CONV (MISCELLANEOUS) ×4 IMPLANT
SHEATH PROBE COVER 6X72 (BAG) ×2 IMPLANT
SLING ARM FOAM STRAP LRG (SOFTGOODS) IMPLANT
SLING ARM FOAM STRAP MED (SOFTGOODS) IMPLANT
STRIP CLOSURE SKIN 1/2X4 (GAUZE/BANDAGES/DRESSINGS) ×2 IMPLANT
SUT MNCRL AB 4-0 PS2 18 (SUTURE) IMPLANT
SUT PROLENE 5 0 C 1 24 (SUTURE) IMPLANT
SUT PROLENE 6 0 BV (SUTURE) IMPLANT
SUT SILK 2 0 SH (SUTURE) IMPLANT
SUT VIC AB 3-0 SH 27X BRD (SUTURE) ×4 IMPLANT
SYR 3ML LL SCALE MARK (SYRINGE) IMPLANT
SYR TOOMEY 50ML (SYRINGE) IMPLANT
TOWEL GREEN STERILE (TOWEL DISPOSABLE) ×2 IMPLANT
UNDERPAD 30X36 HEAVY ABSORB (UNDERPADS AND DIAPERS) ×2 IMPLANT
WATER STERILE IRR 1000ML POUR (IV SOLUTION) ×2 IMPLANT

## 2023-09-18 NOTE — Transfer of Care (Signed)
Immediate Anesthesia Transfer of Care Note  Patient: [name]  Procedure(s) Performed: INSERTION OF ARTERIOVENOUS (AV) GORE-TEX GRAFT THIGH (Left)  Patient Location: PACU  Anesthesia Type:General  Level of Consciousness: awake and patient cooperative  Airway & Oxygen Therapy: Patient Spontanous Breathing  Post-op Assessment: Report given to RN and Post -op Vital signs reviewed and stable  Post vital signs: Reviewed and stable  Last Vitals:  Vitals Value Taken Time  BP 160/82 09/18/23 0945  Temp    Pulse 88 09/18/23 0947  Resp 25 09/18/23 0947  SpO2 96 % 09/18/23 0947  Vitals shown include unfiled device data.  Last Pain:  Vitals:   09/18/23 0633  TempSrc:   PainSc: 0-No pain         Complications: No notable events documented.

## 2023-09-18 NOTE — Anesthesia Procedure Notes (Signed)
Procedure Name: Intubation Date/Time: 09/18/2023 7:38 AM  Performed by: Loleta Lekendrick Alpern, CRNAPre-anesthesia Checklist: Patient identified, Patient being monitored, Timeout performed, Emergency Drugs available and Suction available Patient Re-evaluated:Patient Re-evaluated prior to induction Oxygen Delivery Method: Circle system utilized Preoxygenation: Pre-oxygenation with 100% oxygen Induction Type: IV induction Ventilation: Mask ventilation without difficulty Laryngoscope Size: Mac and 4 Grade View: Grade I Tube type: Oral Tube size: 7.0 mm Number of attempts: 1 Airway Equipment and Method: Stylet Placement Confirmation: ETT inserted through vocal cords under direct vision, positive ETCO2 and breath sounds checked- equal and bilateral Secured at: 22 cm Tube secured with: Tape Dental Injury: Teeth and Oropharynx as per pre-operative assessment

## 2023-09-18 NOTE — Op Note (Signed)
DATE OF SERVICE: 09/18/2023  PATIENT:  [name]  53 y.o. female  PRE-OPERATIVE DIAGNOSIS:  ESRD  POST-OPERATIVE DIAGNOSIS:  Same  PROCEDURE:   Left thigh arteriovenous graft (L SFA to L GSV)  SURGEON:  Surgeons and Role:    * Leonie Douglas, MD - Primary  ASSISTANT: Aggie Moats, PA-C  An experienced assistant was required given the complexity of this procedure and the standard of surgical care. My assistant helped with exposure through counter tension, suctioning, ligation and retraction to better visualize the surgical field.  My assistant expedited sewing during the case by following my sutures. Wherever I use the term "we" in the report, my assistant actively helped me with that portion of the procedure.  ANESTHESIA:   general  EBL: minimal  BLOOD ADMINISTERED:none  DRAINS: none   LOCAL MEDICATIONS USED:  NONE  SPECIMEN:  none  COUNTS: confirmed correct.  TOURNIQUET:  none  PATIENT DISPOSITION:  PACU - hemodynamically stable.   Delay start of Pharmacological VTE agent (>24hrs) due to surgical blood loss or risk of bleeding: no  INDICATION FOR PROCEDURE: [name] is a 53 y.o. female with ESRD in need of permanent dialysis access. She has limited options remaining for hemodialysis access. After careful discussion of risks, benefits, and alternatives the patient was offered left thigh graft. The patient understood and wished to proceed.  OPERATIVE FINDINGS: healthy SFA. Healthy great saphenous vein. Good technical result from left thigh graft with palpable thrill at completion and audible doppler bruit at [facility]. Doppler flow confirmed in L PT which augmented with compression of graft.   DESCRIPTION OF PROCEDURE: After identification of the patient in the pre-operative holding area, the patient was transferred to the operating room. The patient was positioned supine on the operating room table. Anesthesia was induced. The left thigh was prepped and draped in  standard fashion. A surgical pause was performed confirming correct patient, procedure, and operative location.  An oblique incision was made in the left groin over the course of the femoral bifurcation.  Incision was carried down through the subcutaneous tissue until the femoral sheath was encountered and divided carefully with Bovie electrocautery.  Several centimeters of superficial femoral artery were skeletonized and encircled with Silastic Vesseloops.  We next expose the saphenofemoral junction and several centimeters of greater saphenous vein.  This vein appeared very healthy and adequate for an outflow target.  A 6 mm stretch PTFE graft was brought on the field and tunneled in the subcutaneous loop in the left upper thigh.  A counterincision was made in the left distal thigh to allow delivery of the graft.  Great care was taken to avoid twisting or kinking the graft.  The graft was laying so that the arterial anastomosis was lateral in the wound and the venous anastomosis was medial.  Flow would be expected to occur in a clockwise direction.  The patient was systemically heparinized.  Clamps were applied to the superficial femoral artery.  An arteriotomy was made in the anterior aspect of the superficial femoral artery.  The vascular graft was spatulated to allow end to side anastomosis to the arteriotomy.  This was performed using continuous running suture of 6-0 Prolene.  After completion anastomosis was flushed on the open end of the vascular graft.  Good flow was noted.  The saphenofemoral junction was clamped with a bulldog clamp.  The first branch point of the saphenofemoral junction was ligated and divided.  The branch was incised with a Potts scissor to  allow end to end anastomosis at the venous segment.  This was performed using continuous running suture of 6-0 Prolene with the medial aspect of the vascular graft.  Prior to completion the anastomosis was flushed and de-aired.  Clamps were  released on the vascular graft.  Palpable thrill was noted.  Doppler bruit was noted.  Good Doppler flow was noted proximal and distal to the graft with and without compression.  A posterior tibial artery signal was noted which augmented with compression of the graft.  Stasis was achieved in the surgical bed in the anastomoses.  The wounds were closed in layers using 2-0 Vicryl, 3-0 Vicryl, 4-0 Monocryl.  Upon completion of the case instrument and sharps counts were confirmed correct. The patient was transferred to the PACU in good condition. I was present for all portions of the procedure.  FOLLOW UP PLAN: Assuming a normal postoperative course, VVS PA will see the patient in 2-3 weeks weeks with thigh graft duplex and ABI.   Rande Brunt. Lenell Antu, MD [facility] Phone Number: [phone] 09/18/2023 9:03 AM

## 2023-09-18 NOTE — Discharge Instructions (Signed)

## 2023-09-18 NOTE — H&P (Signed)
[facility] OF [place]  ASSESSMENT / PLAN: 53 y.o. female with ESRD in need of permanent access. Plan L thigh graft today in OR.   CHIEF COMPLAINT: ESRD in need of access  HISTORY OF PRESENT ILLNESS: [name] is a 53 y.o. female who is s/p right upper arm AV graft creation on 07/17/2023 by Dr. Lenell Antu.  This was done for permanent dialysis access.  She has a history of left brachiobasilic fistula which thrombosed prior to its use.  This fistula also set off alarms in her ICD, which is implanted in her left-sided chest wall. She also has had a right forearm loop graft which unfortunately thrombosed within 2 to 3 months of its use.   She returns today for follow-up.  She states her right upper arm incisions have healed well without any issue.  She denies any symptoms of steal in the right hand including weakness, numbness, excessive coldness, or pain.    She continues dialysis through a right IJ TDC on MWF.   Past Medical History:  Diagnosis Date   AICD (automatic cardioverter/defibrillator) present    Medtronic   Anemia    Anxiety    Arthritis    Asthma    CHF (congestive heart failure) (HCC)    Chronic diastolic CHF (congestive heart failure) (HCC) 12/14/2019   Depression    Diabetic ulcer of left foot (HCC) 05/12/2013   ESRD on hemodialysis (HCC)    MWF at [facility]   Gastroparesis    Generalized abdominal pain    History of chicken pox    Loss of weight 12/02/2019   Migraines    Mixed hyperlipidemia 09/05/2022   Mood disorder (HCC)    anxiety   Myocardial infarction (HCC)    NSTEMI (non-ST elevated myocardial infarction) (HCC) 05/13/2022   Prurigo nodularis    with diabetic dermopathy   Type 1 diabetes mellitus (HCC) 05/23/2022   Type 1 diabetes, uncontrolled, with neuropathy    [place]   Ulcers of both lower legs (HCC) 02/20/2014    Past Surgical History:  Procedure Laterality Date   AV FISTULA PLACEMENT Left 10/01/2022   Procedure:  ARTERIOVENOUS (AV)FISTULA CREATION;  Surgeon: Larina Earthly, MD;  Location: [facility] ORS;  Service: Vascular;  Laterality: Left;   AV FISTULA PLACEMENT Right 11/19/2022   Procedure: INSERTION OF RIGHT ARM ARTERIOVENOUS (AV) GORE-TEX GRAFT;  Surgeon: Chuck Hint, MD;  Location: [facility] OR;  Service: Vascular;  Laterality: Right;   AV FISTULA PLACEMENT Right 07/17/2023   Procedure: INSERTION OF ARTERIOVENOUS (AV) GOR-TEX GRAFT RIGHT ARM;  Surgeon: Leonie Douglas, MD;  Location: [facility] OR;  Service: Vascular;  Laterality: Right;   BIOPSY  08/11/2020   Procedure: BIOPSY;  Surgeon: Meryl Dare, MD;  Location: [facility] ENDOSCOPY;  Service: Endoscopy;;   BIOPSY  04/02/2022   Procedure: BIOPSY;  Surgeon: Lynann Bologna, MD;  Location: [facility] ENDOSCOPY;  Service: Gastroenterology;;   ESOPHAGOGASTRODUODENOSCOPY (EGD) WITH PROPOFOL N/A 08/11/2020   Procedure: ESOPHAGOGASTRODUODENOSCOPY (EGD) WITH PROPOFOL;  Surgeon: Meryl Dare, MD;  Location: [facility] ENDOSCOPY;  Service: Endoscopy;  Laterality: N/A;   ESOPHAGOGASTRODUODENOSCOPY (EGD) WITH PROPOFOL N/A 04/02/2022   Procedure: ESOPHAGOGASTRODUODENOSCOPY (EGD) WITH PROPOFOL;  Surgeon: Lynann Bologna, MD;  Location: [facility] ENDOSCOPY;  Service: Gastroenterology;  Laterality: N/A;   ESOPHAGOGASTRODUODENOSCOPY (EGD) WITH PROPOFOL N/A 05/23/2022   Procedure: ESOPHAGOGASTRODUODENOSCOPY (EGD) WITH PROPOFOL;  Surgeon: Midge Minium, MD;  Location: [facility] ENDOSCOPY;  Service: Endoscopy;  Laterality: N/A;   ESOPHAGOGASTRODUODENOSCOPY (EGD) WITH PROPOFOL N/A 07/15/2023  Procedure: ESOPHAGOGASTRODUODENOSCOPY (EGD) WITH PROPOFOL;  Surgeon: Charlott Rakes, MD;  Location: [facility] ENDOSCOPY;  Service: Gastroenterology;  Laterality: N/A;   ICD IMPLANT N/A 04/09/2022   Procedure: ICD IMPLANT;  Surgeon: [name], MD;  Location: [facility] INVASIVE CV LAB;  Service: Cardiovascular;  Laterality: N/A;   IR FLUORO GUIDE CV LINE RIGHT  10/07/2022   IR US GUIDE VASC ACCESS RIGHT  10/07/2022   treadmill  stress test  01/2013   WNL, low risk study    Family History  Problem Relation Age of Onset   Diabetes Mother        type 2   Hypertension Mother    Thyroid disease Mother    Bipolar disorder Mother    Heart disease Mother    Calcium disorder Mother    Cancer Maternal Grandmother        Breast, stomach   Cancer Paternal Grandmother        stomach, lung (smoker)   Diabetes Paternal Grandmother    Diabetes Paternal Grandfather    Heart failure Sister    Diabetes Sister    Stroke Maternal Aunt    Cancer Maternal Uncle        prostate   CAD Maternal Aunt        stents   Cancer Maternal Aunt 79       ovarian    Social History   Socioeconomic History   Marital status: Divorced    Spouse name: Not on file   Number of children: 2   Years of education: Not on file   Highest education level: Some college, no degree  Occupational History   Occupation: Production designer, theatre/television/film  Tobacco Use   Smoking status: Never   Smokeless tobacco: Never  Vaping Use   Vaping status: Never Used  Substance and Sexual Activity   Alcohol use: Not Currently   Drug use: No   Sexual activity: Yes    Partners: Male    Birth control/protection: None  Other Topics Concern   Not on file  Social History Narrative   Caffeine use: daily   Occupation: cleans houses   Regular exercise: yes, active 5x/wk   Diet: good water, fruits/vegetables daily   Social Drivers of Health   Financial Resource Strain: Medium Risk (12/26/2022)   Overall Financial Resource Strain (CARDIA)    Difficulty of Paying Living Expenses: Somewhat hard  Food Insecurity: No Food Insecurity (07/14/2023)   Hunger Vital Sign    Worried About Running Out of Food in the Last Year: Never true    Ran Out of Food in the Last Year: Never true  Transportation Needs: No Transportation Needs (07/14/2023)   PRAPARE - Administrator, Civil Service (Medical): No    Lack of Transportation (Non-Medical): No  Physical Activity: Inactive  (12/26/2022)   Exercise Vital Sign    Days of Exercise per Week: 0 days    Minutes of Exercise per Session: 0 min  Stress: Stress Concern Present (01/07/2023)   [employer] of Occupational Health - Occupational Stress Questionnaire    Feeling of Stress : To some extent  Social Connections: Socially Isolated (12/26/2022)   Social Connection and Isolation Panel [NHANES]    Frequency of Communication with Friends and Family: Twice a week    Frequency of Social Gatherings with Friends and Family: More than three times a week    Attends Religious Services: Never    Database administrator or Organizations: No    Attends Club or  Organization Meetings: Never    Marital Status: Divorced  Catering manager Violence: Not At Risk (07/14/2023)   Humiliation, Afraid, Rape, and Kick questionnaire    Fear of Current or Ex-Partner: No    Emotionally Abused: No    Physically Abused: No    Sexually Abused: No    Allergies  Allergen Reactions   Atorvastatin Other (See Comments)    Dizziness    Dulaglutide Nausea And Vomiting and Other (See Comments)    Caused pancreatitis    Ivp Dye [Iodinated Contrast Media] Hives, Itching and Nausea And Vomiting    tremors   Metoclopramide Other (See Comments)    Possible movement disorder or tardive dyskinesia while on reglan Long term   Other Itching    Peas and carrots   Sertraline Nausea And Vomiting   Vancomycin Itching and Swelling   Zofran [Ondansetron] Nausea And Vomiting and Other (See Comments)    Causes nausea vomiting to worsen / doesn't really work for patient.   Amoxicillin Itching and Rash   Lantus [Insulin Glargine] Itching and Rash    Has tolerated insulin glargine many times with no reported issues   Miconazole Nitrate Nausea And Vomiting and Rash    Current Facility-Administered Medications  Medication Dose Route Frequency Provider Last Rate Last Admin   chlorhexidine (HIBICLENS) 4 % liquid 4 Application  60 mL Topical Once Leonie Douglas, MD       And   chlorhexidine (HIBICLENS) 4 % liquid 4 Application  60 mL Topical Once Leonie Douglas, MD       ciprofloxacin (CIPRO) IVPB 400 mg  400 mg Intravenous 60 min Pre-Op Leonie Douglas, MD       insulin aspart (novoLOG) injection 5 Units  5 Units Subcutaneous Once [place] Nation, MD       sodium chloride flush (NS) 0.9 % injection 3-10 mL  3-10 mL Intravenous Q12H Leonie Douglas, MD       sodium chloride flush (NS) 0.9 % injection 3-10 mL  3-10 mL Intravenous PRN Leonie Douglas, MD       sodium chloride flush (NS) 0.9 % injection 3-10 mL  3-10 mL Intravenous Q12H [place] Nation, MD       sodium chloride flush (NS) 0.9 % injection 3-10 mL  3-10 mL Intravenous PRN  Nation, MD       Facility-Administered Medications Ordered in Other Encounters  Medication Dose Route Frequency Provider Last Rate Last Admin   alteplase (CATHFLO ACTIVASE) injection 2 mg  2 mg Intracatheter Once PRN Oretha Milch, PA-C       anticoagulant sodium citrate solution 5 mL  5 mL Intracatheter PRN Collins, Samantha G, PA-C       heparin injection 1,000 Units  1,000 Units Intracatheter PRN Oretha Milch, PA-C   5,000 Units at 07/17/23 0829   lidocaine (PF) (XYLOCAINE) 1 % injection 5 mL  5 mL Intradermal PRN Oretha Milch, PA-C       lidocaine-prilocaine (EMLA) cream 1 Application  1 Application Topical PRN Collins, Quenten Raven, PA-C       pentafluoroprop-tetrafluoroeth (GEBAUERS) aerosol 1 Application  1 Application Topical PRN Oretha Milch, PA-C        PHYSICAL EXAM Vitals:   09/17/23 1135 09/18/23 0547  BP:  (!) 155/99  Pulse:  92  Resp:  20  Temp:  98 F (36.7 C)  TempSrc:  Oral  SpO2:  98%  Weight: 72.6 kg 70.8  kg  Height: 6' (1.829 m) 6' (1.829 m)   Well appearing in no distress Regular rate and rhythm Unlabored breathing  PERTINENT LABORATORY AND RADIOLOGIC DATA  Most recent CBC    Latest Ref Rng & Units 09/18/2023    6:33 AM 07/18/2023    9:25 AM  07/17/2023    4:57 AM  CBC  WBC 4.0 - 10.5 K/uL  8.5  7.5   Hemoglobin 12.0 - 15.0 g/dL 16.1  8.7  9.5   Hematocrit 36.0 - 46.0 % 33.0  26.7  28.7   Platelets 150 - 400 K/uL  169  159      Most recent CMP    Latest Ref Rng & Units 09/18/2023    6:33 AM 07/18/2023    9:25 AM 07/17/2023    4:57 AM  CMP  Glucose 70 - 99 mg/dL 096  045  409   BUN 6 - 20 mg/dL 42  40  25   Creatinine 0.44 - 1.00 mg/dL 8.11  9.14  7.82   Sodium 135 - 145 mmol/L 134  135  135   Potassium 3.5 - 5.1 mmol/L 4.1  3.3  3.5   Chloride 98 - 111 mmol/L 95  100  98   CO2 22 - 32 mmol/L  23  26   Calcium 8.9 - 10.3 mg/dL  8.6  8.5     Renal function Estimated Creatinine Clearance: 10.5 mL/min (A) (by C-G formula based on SCr of 6.9 mg/dL (H)).  Hgb A1c MFr Bld (%)  Date Value  07/15/2023 7.9 (H)    LDL Chol Calc (NIH)  Date Value Ref Range Status  12/05/2022 183 (H) 0 - 99 mg/dL Final   Direct LDL  Date Value Ref Range Status  04/12/2014 104.1 mg/dL Final    Comment:    Optimal:  <100 mg/dLNear or Above Optimal:  100-129 mg/dLBorderline High:  130-159 mg/dLHigh:  160-189 mg/dLVery High:  >190 mg/dL    +-------+-----------+-----------+------------+------------+  ABI/TBIToday's ABIToday's TBIPrevious ABIPrevious TBI  +-------+-----------+-----------+------------+------------+  Right [place]         0.92       1.29        1.16          +-------+-----------+-----------+------------+------------+  Left  1.21       1.10       1.26        1.23          +-------+-----------+-----------+------------+------------+   Rande Brunt. Lenell Antu, MD FACS [facility] Phone Number: [phone] 09/18/2023 7:14 AM   Total time spent on preparing this encounter including chart review, data review, collecting history, examining the patient, coordinating care for this established patient, 30 minutes.  Portions of this report may have been transcribed using voice  recognition software.  Every effort has been made to ensure accuracy; however, inadvertent computerized transcription errors may still be present.

## 2023-09-18 NOTE — Anesthesia Postprocedure Evaluation (Signed)
Anesthesia Post Note  Patient: [name]  Procedure(s) Performed: INSERTION OF ARTERIOVENOUS (AV) GORE-TEX GRAFT THIGH (Left)     Patient location during evaluation: PACU Anesthesia Type: General Level of consciousness: awake and alert Pain management: pain level controlled Vital Signs Assessment: post-procedure vital signs reviewed and stable Respiratory status: spontaneous breathing, nonlabored ventilation, respiratory function stable and patient connected to nasal cannula oxygen Cardiovascular status: blood pressure returned to baseline and stable Postop Assessment: no apparent nausea or vomiting Anesthetic complications: no   No notable events documented.  Last Vitals:  Vitals:   09/18/23 1045 09/18/23 1100  BP: 107/76 113/72  Pulse: 90 87  Resp: 20 20  Temp:  36.4 C  SpO2: 96% 95%    Last Pain:  Vitals:   09/18/23 1100  TempSrc:   PainSc: 0-No pain                 [place]

## 2023-09-19 ENCOUNTER — Encounter (HOSPITAL_COMMUNITY): Payer: Self-pay | Admitting: Vascular Surgery

## 2023-09-22 DIAGNOSIS — Z992 Dependence on renal dialysis: Secondary | ICD-10-CM | POA: Diagnosis not present

## 2023-09-22 DIAGNOSIS — T8249XA Other complication of vascular dialysis catheter, initial encounter: Secondary | ICD-10-CM | POA: Diagnosis not present

## 2023-09-22 DIAGNOSIS — N2581 Secondary hyperparathyroidism of renal origin: Secondary | ICD-10-CM | POA: Diagnosis not present

## 2023-09-22 DIAGNOSIS — N186 End stage renal disease: Secondary | ICD-10-CM | POA: Diagnosis not present

## 2023-09-24 DIAGNOSIS — T8249XA Other complication of vascular dialysis catheter, initial encounter: Secondary | ICD-10-CM | POA: Diagnosis not present

## 2023-09-24 DIAGNOSIS — N2581 Secondary hyperparathyroidism of renal origin: Secondary | ICD-10-CM | POA: Diagnosis not present

## 2023-09-24 DIAGNOSIS — N186 End stage renal disease: Secondary | ICD-10-CM | POA: Diagnosis not present

## 2023-09-24 DIAGNOSIS — Z992 Dependence on renal dialysis: Secondary | ICD-10-CM | POA: Diagnosis not present

## 2023-09-26 ENCOUNTER — Telehealth: Payer: Self-pay

## 2023-09-26 DIAGNOSIS — Z992 Dependence on renal dialysis: Secondary | ICD-10-CM | POA: Diagnosis not present

## 2023-09-26 DIAGNOSIS — N186 End stage renal disease: Secondary | ICD-10-CM | POA: Diagnosis not present

## 2023-09-26 DIAGNOSIS — N2581 Secondary hyperparathyroidism of renal origin: Secondary | ICD-10-CM | POA: Diagnosis not present

## 2023-09-26 DIAGNOSIS — T8249XA Other complication of vascular dialysis catheter, initial encounter: Secondary | ICD-10-CM | POA: Diagnosis not present

## 2023-09-26 NOTE — Telephone Encounter (Signed)
Triage:  -pt called to report she went to HD and they said her graft was not working-that they did not feel anything.  -called pt and she stating the doctor at the clinic checked it out and said it was fine and there was NOT a problem, was thankful we called and her graft is ok.

## 2023-09-29 DIAGNOSIS — N186 End stage renal disease: Secondary | ICD-10-CM | POA: Diagnosis not present

## 2023-09-29 DIAGNOSIS — N2581 Secondary hyperparathyroidism of renal origin: Secondary | ICD-10-CM | POA: Diagnosis not present

## 2023-09-29 DIAGNOSIS — T8249XA Other complication of vascular dialysis catheter, initial encounter: Secondary | ICD-10-CM | POA: Diagnosis not present

## 2023-09-29 DIAGNOSIS — Z992 Dependence on renal dialysis: Secondary | ICD-10-CM | POA: Diagnosis not present

## 2023-09-30 NOTE — Telephone Encounter (Signed)
 Outreach made to Pt.  Scheduled yearly follow up.  She will bring her bedside monitor to troubleshoot.  She gets an error code when she tries to send a transmission.

## 2023-10-01 DIAGNOSIS — Z992 Dependence on renal dialysis: Secondary | ICD-10-CM | POA: Diagnosis not present

## 2023-10-01 DIAGNOSIS — N186 End stage renal disease: Secondary | ICD-10-CM | POA: Diagnosis not present

## 2023-10-01 DIAGNOSIS — N2581 Secondary hyperparathyroidism of renal origin: Secondary | ICD-10-CM | POA: Diagnosis not present

## 2023-10-01 DIAGNOSIS — T8249XA Other complication of vascular dialysis catheter, initial encounter: Secondary | ICD-10-CM | POA: Diagnosis not present

## 2023-10-03 DIAGNOSIS — I129 Hypertensive chronic kidney disease with stage 1 through stage 4 chronic kidney disease, or unspecified chronic kidney disease: Secondary | ICD-10-CM | POA: Diagnosis not present

## 2023-10-03 DIAGNOSIS — T8249XA Other complication of vascular dialysis catheter, initial encounter: Secondary | ICD-10-CM | POA: Diagnosis not present

## 2023-10-03 DIAGNOSIS — Z992 Dependence on renal dialysis: Secondary | ICD-10-CM | POA: Diagnosis not present

## 2023-10-03 DIAGNOSIS — N186 End stage renal disease: Secondary | ICD-10-CM | POA: Diagnosis not present

## 2023-10-03 DIAGNOSIS — N2581 Secondary hyperparathyroidism of renal origin: Secondary | ICD-10-CM | POA: Diagnosis not present

## 2023-10-06 DIAGNOSIS — N2581 Secondary hyperparathyroidism of renal origin: Secondary | ICD-10-CM | POA: Diagnosis not present

## 2023-10-06 DIAGNOSIS — T8249XA Other complication of vascular dialysis catheter, initial encounter: Secondary | ICD-10-CM | POA: Diagnosis not present

## 2023-10-06 DIAGNOSIS — N186 End stage renal disease: Secondary | ICD-10-CM | POA: Diagnosis not present

## 2023-10-06 DIAGNOSIS — Z992 Dependence on renal dialysis: Secondary | ICD-10-CM | POA: Diagnosis not present

## 2023-10-08 DIAGNOSIS — N186 End stage renal disease: Secondary | ICD-10-CM | POA: Diagnosis not present

## 2023-10-08 DIAGNOSIS — Z992 Dependence on renal dialysis: Secondary | ICD-10-CM | POA: Diagnosis not present

## 2023-10-08 DIAGNOSIS — N2581 Secondary hyperparathyroidism of renal origin: Secondary | ICD-10-CM | POA: Diagnosis not present

## 2023-10-08 DIAGNOSIS — T8249XA Other complication of vascular dialysis catheter, initial encounter: Secondary | ICD-10-CM | POA: Diagnosis not present

## 2023-10-08 NOTE — Progress Notes (Unsigned)
  Electrophysiology Office Note:   ID:  [name], DOB [birth year], MRN [id number]  Primary Cardiologist: Christell Constant, MD Electrophysiologist: Maurice Small, MD  {Click to update primary MD,subspecialty MD or APP then REFRESH:1}    History of Present Illness:   [name] is a 53 y.o. female with h/o h/o VF arrest s/p ICD, chronic systolic CHF, HTN, Gastroparesis, and ESRD on HD seen today for routine electrophysiology followup.   Since last being seen in our clinic the patient reports doing ***.  she denies chest pain, palpitations, dyspnea, PND, orthopnea, nausea, vomiting, dizziness, syncope, edema, weight gain, or early satiety.   Review of systems complete and found to be negative unless listed in HPI.   EP Information / Studies Reviewed:    EKG is not ordered today. EKG from 07/14/2023 reviewed which showed sinus tachy at 117 bpm       ICD Interrogation-  reviewed in detail today,  See PACEART report.  Arrhythmia/Device History Medtronic Single Chamber ICD implanted 04/2022 for aborted cardiac arrest   Physical Exam:   VS:  LMP 11/17/2021 (Approximate)    Wt Readings from Last 3 Encounters:  09/18/23 156 lb (70.8 kg)  08/12/23 166 lb 6.4 oz (75.5 kg)  07/18/23 161 lb 6 oz (73.2 kg)     GEN: No acute distress *** NECK: No JVD; No carotid bruits CARDIAC: {EPRHYTHM:28826}, no murmurs, rubs, gallops RESPIRATORY:  Clear to auscultation without rales, wheezing or rhonchi  ABDOMEN: Soft, non-tender, non-distended EXTREMITIES:  {EDEMA LEVEL:28147::"No"} edema; No deformity   ASSESSMENT AND PLAN:    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  Other meds limited by ESRD  HTN Stable on current regimen   DM1  with gastroparesis Chronic. Per PCP/Endo  ESRD ***  Disposition:   Follow up with {EPPROVIDERS:28135} {EPFOLLOW UP:28173}   Signed, Graciella Freer, PA-C

## 2023-10-09 ENCOUNTER — Ambulatory Visit: Payer: BC Managed Care – PPO | Attending: Student | Admitting: Student

## 2023-10-09 ENCOUNTER — Ambulatory Visit

## 2023-10-09 ENCOUNTER — Encounter: Payer: Self-pay | Admitting: Student

## 2023-10-09 ENCOUNTER — Other Ambulatory Visit: Payer: Self-pay | Admitting: *Deleted

## 2023-10-09 VITALS — BP 152/82 | HR 98 | Ht 72.0 in | Wt 172.2 lb

## 2023-10-09 DIAGNOSIS — I2510 Atherosclerotic heart disease of native coronary artery without angina pectoris: Secondary | ICD-10-CM

## 2023-10-09 DIAGNOSIS — N186 End stage renal disease: Secondary | ICD-10-CM

## 2023-10-09 DIAGNOSIS — I428 Other cardiomyopathies: Secondary | ICD-10-CM

## 2023-10-09 DIAGNOSIS — I10 Essential (primary) hypertension: Secondary | ICD-10-CM | POA: Diagnosis not present

## 2023-10-09 DIAGNOSIS — N184 Chronic kidney disease, stage 4 (severe): Secondary | ICD-10-CM

## 2023-10-09 DIAGNOSIS — Z992 Dependence on renal dialysis: Secondary | ICD-10-CM

## 2023-10-09 DIAGNOSIS — M79606 Pain in leg, unspecified: Secondary | ICD-10-CM

## 2023-10-09 LAB — CUP PACEART INCLINIC DEVICE CHECK
Battery Remaining Longevity: 123 mo
Battery Voltage: 3.03 V
Brady Statistic RV Percent Paced: 0 %
Date Time Interrogation Session: 20250306082441
HighPow Impedance: 66 Ohm
Implantable Lead Connection Status: 753985
Implantable Lead Implant Date: 20230905
Implantable Lead Location: 753860
Implantable Pulse Generator Implant Date: 20230905
Lead Channel Impedance Value: 285 Ohm
Lead Channel Impedance Value: 342 Ohm
Lead Channel Pacing Threshold Amplitude: 0.875 V
Lead Channel Pacing Threshold Pulse Width: 0.4 ms
Lead Channel Sensing Intrinsic Amplitude: 5.375 mV
Lead Channel Sensing Intrinsic Amplitude: 7.75 mV
Lead Channel Setting Pacing Amplitude: 2 V
Lead Channel Setting Pacing Pulse Width: 0.4 ms
Lead Channel Setting Sensing Sensitivity: 0.3 mV
Zone Setting Status: 755011
Zone Setting Status: 755011

## 2023-10-09 NOTE — Patient Instructions (Signed)
 Medication Instructions:  Your physician recommends that you continue on your current medications as directed. Please refer to the Current Medication list given to you today.  *If you need a refill on your cardiac medications before your next appointment, please call your pharmacy*   Lab Work: None ordered If you have labs (blood work) drawn today and your tests are completely normal, you will receive your results only by: MyChart Message (if you have MyChart) OR A paper copy in the mail If you have any lab test that is abnormal or we need to change your treatment, we will call you to review the results.  Follow-Up: At [facility], you and your health needs are our priority.  As part of our continuing mission to provide you with exceptional heart care, we have created designated Provider Care Teams.  These Care Teams include your primary Cardiologist (physician) and Advanced Practice Providers (APPs -  Physician Assistants and Nurse Practitioners) who all work together to provide you with the care you need, when you need it.  Your next appointment:   1 year(s)  Provider:   You may see Maurice Small, MD or one of the following Advanced Practice Providers on your designated Care Team:   Francis Dowse, [place] [address]" [place], [place] Sherie Don, NP Canary Brim, NP

## 2023-10-10 DIAGNOSIS — N2581 Secondary hyperparathyroidism of renal origin: Secondary | ICD-10-CM | POA: Diagnosis not present

## 2023-10-10 DIAGNOSIS — N186 End stage renal disease: Secondary | ICD-10-CM | POA: Diagnosis not present

## 2023-10-10 DIAGNOSIS — T8249XA Other complication of vascular dialysis catheter, initial encounter: Secondary | ICD-10-CM | POA: Diagnosis not present

## 2023-10-10 DIAGNOSIS — Z992 Dependence on renal dialysis: Secondary | ICD-10-CM | POA: Diagnosis not present

## 2023-10-12 LAB — CUP PACEART REMOTE DEVICE CHECK
Battery Remaining Longevity: 123 mo
Battery Voltage: 3.03 V
Brady Statistic RV Percent Paced: 0 %
Date Time Interrogation Session: 20250306081917
HighPow Impedance: 66 Ohm
Implantable Lead Connection Status: 753985
Implantable Lead Implant Date: 20230905
Implantable Lead Location: 753860
Implantable Pulse Generator Implant Date: 20230905
Lead Channel Impedance Value: 285 Ohm
Lead Channel Impedance Value: 342 Ohm
Lead Channel Pacing Threshold Amplitude: 0.875 V
Lead Channel Pacing Threshold Pulse Width: 0.4 ms
Lead Channel Sensing Intrinsic Amplitude: 5.375 mV
Lead Channel Sensing Intrinsic Amplitude: 7.75 mV
Lead Channel Setting Pacing Amplitude: 2 V
Lead Channel Setting Pacing Pulse Width: 0.4 ms
Lead Channel Setting Sensing Sensitivity: 0.3 mV
Zone Setting Status: 755011
Zone Setting Status: 755011

## 2023-10-13 DIAGNOSIS — T8249XA Other complication of vascular dialysis catheter, initial encounter: Secondary | ICD-10-CM | POA: Diagnosis not present

## 2023-10-13 DIAGNOSIS — N186 End stage renal disease: Secondary | ICD-10-CM | POA: Diagnosis not present

## 2023-10-13 DIAGNOSIS — Z992 Dependence on renal dialysis: Secondary | ICD-10-CM | POA: Diagnosis not present

## 2023-10-13 DIAGNOSIS — N2581 Secondary hyperparathyroidism of renal origin: Secondary | ICD-10-CM | POA: Diagnosis not present

## 2023-10-14 ENCOUNTER — Ambulatory Visit (INDEPENDENT_AMBULATORY_CARE_PROVIDER_SITE_OTHER)
Admit: 2023-10-14 | Discharge: 2023-10-14 | Disposition: A | Discharge status: Home / Self Care | Payer: BC Managed Care – PPO | Attending: Vascular Surgery | Admitting: Vascular Surgery

## 2023-10-14 ENCOUNTER — Ambulatory Visit: Payer: BC Managed Care – PPO

## 2023-10-14 ENCOUNTER — Ambulatory Visit
Admission: RE | Admit: 2023-10-14 | Discharge: 2023-10-14 | Disposition: A | Discharge status: Home / Self Care | Payer: BC Managed Care – PPO | Source: Ambulatory Visit | Attending: Vascular Surgery | Admitting: Vascular Surgery

## 2023-10-14 DIAGNOSIS — M79606 Pain in leg, unspecified: Secondary | ICD-10-CM | POA: Diagnosis not present

## 2023-10-14 DIAGNOSIS — N186 End stage renal disease: Secondary | ICD-10-CM

## 2023-10-14 DIAGNOSIS — Z992 Dependence on renal dialysis: Secondary | ICD-10-CM

## 2023-10-14 DIAGNOSIS — N184 Chronic kidney disease, stage 4 (severe): Secondary | ICD-10-CM | POA: Insufficient documentation

## 2023-10-14 DIAGNOSIS — H43823 Vitreomacular adhesion, bilateral: Secondary | ICD-10-CM | POA: Diagnosis not present

## 2023-10-14 DIAGNOSIS — H2513 Age-related nuclear cataract, bilateral: Secondary | ICD-10-CM | POA: Diagnosis not present

## 2023-10-14 DIAGNOSIS — H35033 Hypertensive retinopathy, bilateral: Secondary | ICD-10-CM | POA: Diagnosis not present

## 2023-10-14 DIAGNOSIS — E113513 Type 2 diabetes mellitus with proliferative diabetic retinopathy with macular edema, bilateral: Secondary | ICD-10-CM | POA: Diagnosis not present

## 2023-10-14 DIAGNOSIS — H3582 Retinal ischemia: Secondary | ICD-10-CM | POA: Diagnosis not present

## 2023-10-14 LAB — VAS US ABI WITH/WO TBI
Left ABI: 0.84
Right ABI: 1.21

## 2023-10-14 NOTE — Progress Notes (Deleted)
 Patient ID: [name]                 DOB: 1970-09-13                    MRN: [id number]      HPI: Jacinta Penalver is a 53 y.o. female patient referred to lipid clinic by Dr. Izora Ribas. PMH is significant for HTN, VF arrest s/p ICD, chronic systolic CHF, HTN, Gastroparesis, and ESRD on HD, type 1 diabetes, familial hypercholesterolemia.    Reviewed options for lowering LDL cholesterol, including ezetimibe, PCSK-9 inhibitors, bempedoic acid and inclisiran.  Discussed mechanisms of action, dosing, side effects and potential decreases in LDL cholesterol.  Also reviewed cost information and potential options for patient assistance.   Current Medications:  Intolerances: atorvastatin 10, 20mg , rosuvastatin 10, 20mg , pravastatin 40mg , ezetimibe Risk Factors: CKD, DM, HTN, possible FH LDL-C goal: <70 ApoB goal:   Diet:   Exercise:   Family History:   Social History:   Labs: Lipid Panel     Component Value Date/Time   CHOL 262 (H) 12/05/2022 0848   TRIG 94 12/05/2022 0848   HDL 63 12/05/2022 0848   CHOLHDL 4.2 12/05/2022 0848   CHOLHDL 5.2 10/12/2019 0834   VLDL 16 10/12/2019 0834   LDLCALC 183 (H) 12/05/2022 0848   LDLDIRECT 104.1 04/12/2014 1608   LABVLDL 16 12/05/2022 0848    Past Medical History:  Diagnosis Date   AICD (automatic cardioverter/defibrillator) present    Medtronic   Anemia    Anxiety    Arthritis    Asthma    CHF (congestive heart failure) (HCC)    Chronic diastolic CHF (congestive heart failure) (HCC) 12/14/2019   Depression    Diabetic ulcer of left foot (HCC) 05/12/2013   ESRD on hemodialysis (HCC)    MWF at [facility]   Gastroparesis    Generalized abdominal pain    History of chicken pox    Loss of weight 12/02/2019   Migraines    Mixed hyperlipidemia 09/05/2022   Mood disorder (HCC)    anxiety   Myocardial infarction (HCC)    NSTEMI (non-ST elevated myocardial infarction) (HCC) 05/13/2022   Prurigo nodularis    with diabetic  dermopathy   Type 1 diabetes mellitus (HCC) 05/23/2022   Type 1 diabetes, uncontrolled, with neuropathy    Phadke   Ulcers of both lower legs (HCC) 02/20/2014    Current Outpatient Medications on File Prior to Visit  Medication Sig Dispense Refill   acetaminophen (TYLENOL) 500 MG tablet Take 1,000 mg by mouth every 8 (eight) hours as needed for moderate pain or mild pain.     albuterol (PROAIR HFA) 108 (90 Base) MCG/ACT inhaler Inhale 2 puffs into the lungs every 6 (six) hours as needed for wheezing or shortness of breath. 6.7 g 1   carvedilol (COREG) 3.125 MG tablet Take 1 tablet (3.125 mg total) by mouth 4 (four) times a week. Take twice daily on non Dialysis days (Tues,Thur,Sat, Sun) (Patient taking differently: Take 3.125 mg by mouth in the morning and at bedtime.) 96 tablet 3   cloNIDine (CATAPRES) 0.1 MG tablet Take 0.1 mg by mouth daily as needed (bp > 180/120).     furosemide (LASIX) 40 MG tablet Take 40 mg by mouth daily as needed for fluid.     gabapentin (NEURONTIN) 300 MG capsule Take 300 mg by mouth at bedtime as needed (nerve pain).     hydrALAZINE (APRESOLINE) 50 MG tablet Take 1  tablet (50 mg total) by mouth 3 (three) times daily. Hold hydralazine before dialysis, and take after dialysis if [facility] blood pressure>120     hydrOXYzine (ATARAX) 50 MG tablet Take 50 mg by mouth 3 (three) times daily as needed for anxiety or itching.     insulin aspart (NOVOLOG) 100 UNIT/ML injection Inject 120 Units into the skin See admin instructions. Patient uses this with the Omnipod insulin pump  its vary according to blood sugar. Loads 120 units every 3 days. (Patient taking differently: Inject 150 Units into the skin See admin instructions. Patient uses this with the Omnipod insulin pump  its vary according to blood sugar. Loads 150 units every 3 days.)     Insulin Disposable Pump (OMNIPOD DASH PODS, GEN 4,) MISC Place 1 Applicatorful onto the skin See admin instructions. Every three days      isosorbide dinitrate (ISORDIL) 20 MG tablet Take 1 tablet (20 mg total) by mouth 3 (three) times daily. Please hold Imdur before HD, and take after the if systolic blood pressure>120     LORazepam (ATIVAN) 0.5 MG tablet Take 1 tablet (0.5 mg total) by mouth every 8 (eight) hours as needed for anxiety (Refractory nausea and vomiting). (Patient taking differently: Take 0.5 mg by mouth every 6 (six) hours as needed for anxiety (Refractory nausea and vomiting).) 30 tablet 0   metoCLOPramide (REGLAN) 10 MG tablet Take 10 mg by mouth every 8 (eight) hours as needed for vomiting or nausea.     nitroGLYCERIN (NITROSTAT) 0.4 MG SL tablet Place 1 tablet (0.4 mg total) under the tongue every 5 (five) minutes as needed for chest pain. 90 tablet 3   oxyCODONE-acetaminophen (PERCOCET) 5-325 MG tablet Take 1 tablet by mouth every 6 (six) hours as needed for severe pain (pain score 7-10). 20 tablet 0   pantoprazole (PROTONIX) 40 MG tablet Take 1 tablet (40 mg total) by mouth 2 (two) times daily. 60 tablet 1   polyethylene glycol (MIRALAX / GLYCOLAX) 17 g packet Take 17 g by mouth daily as needed for moderate constipation.     pregabalin (LYRICA) 50 MG capsule Take 50 mg by mouth daily as needed (neuropathy).     prochlorperazine (COMPAZINE) 5 MG tablet Take 1 tablet (5 mg total) by mouth every 6 (six) hours as needed for nausea or vomiting. 15 tablet 0   RABEprazole (ACIPHEX) 20 MG tablet Take 20 mg by mouth daily.     tobramycin (TOBREX) 0.3 % ophthalmic solution Place 1 drop into both eyes See admin instructions. Instill 1 drop into both eyes once every 8 weeks before eye injections     TRESIBA FLEXTOUCH 100 UNIT/ML FlexTouch Pen Inject into the skin as needed (when insulin pump is not working).     Current Facility-Administered Medications on File Prior to Visit  Medication Dose Route Frequency Provider Last Rate Last Admin   alteplase (CATHFLO ACTIVASE) injection 2 mg  2 mg Intracatheter Once PRN Oretha Milch, PA-C       anticoagulant sodium citrate solution 5 mL  5 mL Intracatheter PRN Thomasena Edis, Quenten Raven, PA-C       heparin injection 1,000 Units  1,000 Units Intracatheter PRN Oretha Milch, PA-C   5,000 Units at 07/17/23 0829   lidocaine (PF) (XYLOCAINE) 1 % injection 5 mL  5 mL Intradermal PRN Oretha Milch, PA-C       lidocaine-prilocaine (EMLA) cream 1 Application  1 Application Topical PRN Oretha Milch, PA-C  pentafluoroprop-tetrafluoroeth (GEBAUERS) aerosol 1 Application  1 Application Topical PRN Oretha Milch, PA-C        Allergies  Allergen Reactions   Atorvastatin Other (See Comments)    Dizziness    Dulaglutide Nausea And Vomiting and Other (See Comments)    Caused pancreatitis    Ivp Dye [Iodinated Contrast Media] Hives, Itching and Nausea And Vomiting    tremors   Metoclopramide Other (See Comments)    Possible movement disorder or tardive dyskinesia while on reglan Long term   Other Itching    Peas and carrots   Sertraline Nausea And Vomiting   Vancomycin Itching and Swelling   Zofran [Ondansetron] Nausea And Vomiting and Other (See Comments)    Causes nausea vomiting to worsen / doesn't really work for patient.   Amoxicillin Itching and Rash   Lantus [Insulin Glargine] Itching and Rash    Has tolerated insulin glargine many times with no reported issues   Miconazole Nitrate Nausea And Vomiting and Rash    Assessment/Plan:  1. Hyperlipidemia -  No problem-specific Assessment & Plan notes found for this encounter.    Thank you,  Olene Floss, Pharm.D, BCACP, CPP [facility] A Division of [facility] [address], [place], [place] [address]  Phone: [phone]; Fax: [phone]

## 2023-10-15 DIAGNOSIS — T8249XA Other complication of vascular dialysis catheter, initial encounter: Secondary | ICD-10-CM | POA: Diagnosis not present

## 2023-10-15 DIAGNOSIS — Z992 Dependence on renal dialysis: Secondary | ICD-10-CM | POA: Diagnosis not present

## 2023-10-15 DIAGNOSIS — N186 End stage renal disease: Secondary | ICD-10-CM | POA: Diagnosis not present

## 2023-10-15 DIAGNOSIS — N2581 Secondary hyperparathyroidism of renal origin: Secondary | ICD-10-CM | POA: Diagnosis not present

## 2023-10-16 ENCOUNTER — Encounter: Payer: Self-pay | Admitting: Cardiovascular Disease

## 2023-10-17 DIAGNOSIS — N2581 Secondary hyperparathyroidism of renal origin: Secondary | ICD-10-CM | POA: Diagnosis not present

## 2023-10-17 DIAGNOSIS — Z992 Dependence on renal dialysis: Secondary | ICD-10-CM | POA: Diagnosis not present

## 2023-10-17 DIAGNOSIS — T8249XA Other complication of vascular dialysis catheter, initial encounter: Secondary | ICD-10-CM | POA: Diagnosis not present

## 2023-10-17 DIAGNOSIS — N186 End stage renal disease: Secondary | ICD-10-CM | POA: Diagnosis not present

## 2023-10-20 ENCOUNTER — Encounter (HOSPITAL_BASED_OUTPATIENT_CLINIC_OR_DEPARTMENT_OTHER): Payer: Self-pay | Admitting: Cardiovascular Disease

## 2023-10-20 ENCOUNTER — Ambulatory Visit (HOSPITAL_BASED_OUTPATIENT_CLINIC_OR_DEPARTMENT_OTHER): Payer: BC Managed Care – PPO | Admitting: Cardiovascular Disease

## 2023-10-20 VITALS — BP 179/76 | HR 105 | Ht 72.0 in | Wt 175.0 lb

## 2023-10-20 DIAGNOSIS — E1069 Type 1 diabetes mellitus with other specified complication: Secondary | ICD-10-CM

## 2023-10-20 DIAGNOSIS — E78019 Familial hypercholesterolemia, unspecified: Secondary | ICD-10-CM

## 2023-10-20 DIAGNOSIS — E1065 Type 1 diabetes mellitus with hyperglycemia: Secondary | ICD-10-CM

## 2023-10-20 DIAGNOSIS — I2510 Atherosclerotic heart disease of native coronary artery without angina pectoris: Secondary | ICD-10-CM

## 2023-10-20 DIAGNOSIS — N2581 Secondary hyperparathyroidism of renal origin: Secondary | ICD-10-CM | POA: Diagnosis not present

## 2023-10-20 DIAGNOSIS — I5042 Chronic combined systolic (congestive) and diastolic (congestive) heart failure: Secondary | ICD-10-CM | POA: Diagnosis not present

## 2023-10-20 DIAGNOSIS — Z992 Dependence on renal dialysis: Secondary | ICD-10-CM | POA: Diagnosis not present

## 2023-10-20 DIAGNOSIS — I10 Essential (primary) hypertension: Secondary | ICD-10-CM | POA: Diagnosis not present

## 2023-10-20 DIAGNOSIS — E7801 Familial hypercholesterolemia: Secondary | ICD-10-CM

## 2023-10-20 DIAGNOSIS — E870 Hyperosmolality and hypernatremia: Secondary | ICD-10-CM

## 2023-10-20 DIAGNOSIS — N186 End stage renal disease: Secondary | ICD-10-CM | POA: Diagnosis not present

## 2023-10-20 DIAGNOSIS — T8249XA Other complication of vascular dialysis catheter, initial encounter: Secondary | ICD-10-CM | POA: Diagnosis not present

## 2023-10-20 NOTE — Progress Notes (Signed)
 Advanced Hypertension Clinic Initial Assessment:    Date:  10/20/2023   ID:  [name], DOB 1971/01/11, MRN [id number]  PCP:  Delma Officer, PA  Cardiologist:  Christell Constant, MD  Nephrologist: Dr. Valentino Nose  Referring MD: Delma Officer, [place]   CC: Hypertension  History of Present Illness:    [name] is a 53 y.o. female with a hx of type 1 diabetes, hypertension, HFrEF, cardiac arrest status post ICD, gastroparesis, familial hyperlipidemia, and ESRD on HD MWF here to establish care in the Advanced Hypertension Clinic.  She is struggled with hypertension and hypertensive urgency.  Echo 06/2023 revealed LVEF 35-40%.  She has a history of sudden cardiac death in 11-05-21.  She is now status post ICD implantation.  In 2019-11-06 she had a coronary CTA with normal coronary arteries and a calcium score of 0.  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  Due to this she is reportedly not a candidate for renal transplant.  She last saw Dr. Izora Ribas 06/2023 and BP was 122/58 on carvedilol, clonidine, hydralazine, Imdur, and olmesartan.  In EP clinic 10/09/2023 BP was 152/82.  Ms. [name] has experienced significant fluctuations in blood pressure for over ten to twelve years, particularly in relation to her dialysis treatments. On dialysis days, her blood pressure often elevates before treatment, reaching levels such as 180/100 mmHg, and then drops significantly during treatment, sometimes as low as 80/66 mmHg. She experiences dizziness and a sensation of impending fainting, particularly on non-dialysis days when her blood pressure can be as low as 100/60 mmHg.  She has a history of kidney disease and has been on dialysis for the past year. She has not been seen in a clinic setting since starting dialysis, and her interactions with her kidney doctor occur during dialysis sessions. She feels that her blood pressure is not being adequately managed during these  sessions.  She experiences frequent vomiting, which she attributes to dumping syndrome. This vomiting often occurs during dialysis sessions and affects her ability to take medications consistently. She has not been taking her medications regularly due to low BP, vomiting and lack of appetite. Her current medications include hydralazine, which she takes as needed, carvedilol, which she takes inconsistently for palpitations, and clonidine patches, which she uses when her blood pressure is high and she is vomiting. She has not taken isosorbide in the last few weeks and was taken off olmesartan.  She lost vision in her left eye in November and has been unable to cook as much, relying on TV dinners which affected her phosphorus levels. She is trying to adjust her diet by preparing salads despite her vision loss. She feels drained after dialysis and often sleeps, and notes that vomiting can be triggered by drinking water or eating certain foods.      Previous antihypertensives:   Past Medical History:  Diagnosis Date   AICD (automatic cardioverter/defibrillator) present    Medtronic   Anemia    Anxiety    Arthritis    Asthma    CHF (congestive heart failure) (HCC)    Chronic diastolic CHF (congestive heart failure) (HCC) 12/14/2019   Depression    Diabetic ulcer of left foot (HCC) 05/12/2013   ESRD on hemodialysis (HCC)    MWF at [facility]   Gastroparesis    Generalized abdominal pain    History of chicken pox    Loss of weight 12/02/2019   Migraines    Mixed hyperlipidemia 09/05/2022  Mood disorder (HCC)    anxiety   Myocardial infarction [facility])    NSTEMI (non-ST elevated myocardial infarction) (HCC) 05/13/2022   Prurigo nodularis    with diabetic dermopathy   Type 1 diabetes mellitus (HCC) 05/23/2022   Type 1 diabetes, uncontrolled, with neuropathy    Phadke   Ulcers of both lower legs (HCC) 02/20/2014    Past Surgical History:  Procedure Laterality Date   AV FISTULA PLACEMENT  Left 10/01/2022   Procedure: ARTERIOVENOUS (AV)FISTULA CREATION;  Surgeon: Larina Earthly, MD;  Location: [facility] ORS;  Service: Vascular;  Laterality: Left;   AV FISTULA PLACEMENT Right 11/19/2022   Procedure: INSERTION OF RIGHT ARM ARTERIOVENOUS (AV) GORE-TEX GRAFT;  Surgeon: Chuck Hint, MD;  Location: [facility] OR;  Service: Vascular;  Laterality: Right;   AV FISTULA PLACEMENT Right 07/17/2023   Procedure: INSERTION OF ARTERIOVENOUS (AV) GOR-TEX GRAFT RIGHT ARM;  Surgeon: Leonie Douglas, MD;  Location: [facility] OR;  Service: Vascular;  Laterality: Right;   AV FISTULA PLACEMENT Left 09/18/2023   Procedure: INSERTION OF ARTERIOVENOUS (AV) GORE-TEX GRAFT THIGH;  Surgeon: Leonie Douglas, MD;  Location: [facility] OR;  Service: Vascular;  Laterality: Left;   BIOPSY  08/11/2020   Procedure: BIOPSY;  Surgeon: Meryl Dare, MD;  Location: [facility] ENDOSCOPY;  Service: Endoscopy;;   BIOPSY  04/02/2022   Procedure: BIOPSY;  Surgeon: Lynann Bologna, MD;  Location: [facility] ENDOSCOPY;  Service: Gastroenterology;;   ESOPHAGOGASTRODUODENOSCOPY (EGD) WITH PROPOFOL N/A 08/11/2020   Procedure: ESOPHAGOGASTRODUODENOSCOPY (EGD) WITH PROPOFOL;  Surgeon: Meryl Dare, MD;  Location: [facility] ENDOSCOPY;  Service: Endoscopy;  Laterality: N/A;   ESOPHAGOGASTRODUODENOSCOPY (EGD) WITH PROPOFOL N/A 04/02/2022   Procedure: ESOPHAGOGASTRODUODENOSCOPY (EGD) WITH PROPOFOL;  Surgeon: Lynann Bologna, MD;  Location: [facility] ENDOSCOPY;  Service: Gastroenterology;  Laterality: N/A;   ESOPHAGOGASTRODUODENOSCOPY (EGD) WITH PROPOFOL N/A 05/23/2022   Procedure: ESOPHAGOGASTRODUODENOSCOPY (EGD) WITH PROPOFOL;  Surgeon: Midge Minium, MD;  Location: [facility] ENDOSCOPY;  Service: Endoscopy;  Laterality: N/A;   ESOPHAGOGASTRODUODENOSCOPY (EGD) WITH PROPOFOL N/A 07/15/2023   Procedure: ESOPHAGOGASTRODUODENOSCOPY (EGD) WITH PROPOFOL;  Surgeon: Charlott Rakes, MD;  Location: [facility] ENDOSCOPY;  Service: Gastroenterology;  Laterality: N/A;   ICD IMPLANT N/A 04/09/2022   Procedure: ICD  IMPLANT;  Surgeon: [name], MD;  Location: [facility] INVASIVE CV LAB;  Service: Cardiovascular;  Laterality: N/A;   IR FLUORO GUIDE CV LINE RIGHT  10/07/2022   IR US GUIDE VASC ACCESS RIGHT  10/07/2022   treadmill stress test  01/2013   WNL, low risk study    Current Medications: No outpatient medications have been marked as taking for the 10/20/23 encounter (Appointment) with Chilton Si, MD.     Allergies:   Atorvastatin, Dulaglutide, Ivp dye [iodinated contrast media], Metoclopramide, Other, Sertraline, Vancomycin, Zofran [ondansetron], Amoxicillin, Lantus [insulin glargine], and Miconazole nitrate   Social History   Socioeconomic History   Marital status: Divorced    Spouse name: Not on file   Number of children: 2   Years of education: Not on file   Highest education level: Some college, no degree  Occupational History   Occupation: Production designer, theatre/television/film  Tobacco Use   Smoking status: Never   Smokeless tobacco: Never  Vaping Use   Vaping status: Never Used  Substance and Sexual Activity   Alcohol use: Not Currently   Drug use: No   Sexual activity: Yes    Partners: Male    Birth control/protection: None  Other Topics Concern   Not on file  Social History Narrative   Caffeine use:  daily   Occupation: cleans houses   Regular exercise: yes, active 5x/wk   Diet: good water, fruits/vegetables daily   Social Drivers of Health   Financial Resource Strain: Medium Risk (12/26/2022)   Overall Financial Resource Strain (CARDIA)    Difficulty of Paying Living Expenses: Somewhat hard  Food Insecurity: No Food Insecurity (07/14/2023)   Hunger Vital Sign    Worried About Running Out of Food in the Last Year: Never true    Ran Out of Food in the Last Year: Never true  Transportation Needs: No Transportation Needs (07/14/2023)   PRAPARE - Administrator, Civil Service (Medical): No    Lack of Transportation (Non-Medical): No  Physical Activity: Inactive (12/26/2022)    Exercise Vital Sign    Days of Exercise per Week: 0 days    Minutes of Exercise per Session: 0 min  Stress: Stress Concern Present (01/07/2023)   [employer] of Occupational Health - Occupational Stress Questionnaire    Feeling of Stress : To some extent  Social Connections: Socially Isolated (12/26/2022)   Social Connection and Isolation Panel [NHANES]    Frequency of Communication with Friends and Family: Twice a week    Frequency of Social Gatherings with Friends and Family: More than three times a week    Attends Religious Services: Never    Database administrator or Organizations: No    Attends Engineer, structural: Never    Marital Status: Divorced     Family History: The patient's family history includes Bipolar disorder in her mother; CAD in her maternal aunt; Calcium disorder in her mother; Cancer in her maternal grandmother, maternal uncle, and paternal grandmother; Cancer (age of onset: 66) in her maternal aunt; Diabetes in her mother, paternal grandfather, paternal grandmother, and sister; Heart disease in her mother; Heart failure in her sister; Hypertension in her mother; Stroke in her maternal aunt; Thyroid disease in her mother.  ROS:   Please see the history of present illness.     All other systems reviewed and are negative.  EKGs/Labs/Other Studies Reviewed:    EKG:  EKG is not ordered today.  Echo 06/2023:   1. Left ventricular ejection fraction, by estimation, is 35 to 40%. The  left ventricle has moderately decreased function. The left ventricle  demonstrates global hypokinesis. Left ventricular diastolic parameters are  consistent with Grade II diastolic  dysfunction (pseudonormalization). Elevated left ventricular end-diastolic  pressure. The average left ventricular global longitudinal strain is -8.9  %. The global longitudinal strain is abnormal.   2. Right ventricular systolic function is normal. The right ventricular  size is normal. There  is normal pulmonary artery systolic pressure.   3. Left atrial size was severely dilated.   4. The mitral valve is degenerative. Trivial mitral valve regurgitation.  No evidence of mitral stenosis.   5. The aortic valve is tricuspid. Aortic valve regurgitation is not  visualized. No aortic stenosis is present.   6. The inferior vena cava is normal in size with greater than 50%  respiratory variability, suggesting right atrial pressure of 3 mmHg.   Coronary CT-a 01/2020: IMPRESSION: 1. Coronary calcium score of 0.   2. Normal coronary origin with right dominance.   3. Normal coronary arteries.   4. Severe biatrial enlargement.    Recent Labs: 10/21/2022: TSH 1.587 07/14/2023: Magnesium 2.2 07/15/2023: ALT 9 07/18/2023: Platelets 169 09/18/2023: BUN 42; Creatinine, Ser 6.90; Hemoglobin 11.2; Potassium 4.1; Sodium 134   Recent Lipid Panel  Component Value Date/Time   CHOL 262 (H) 12/05/2022 0848   TRIG 94 12/05/2022 0848   HDL 63 12/05/2022 0848   CHOLHDL 4.2 12/05/2022 0848   CHOLHDL 5.2 10/12/2019 0834   VLDL 16 10/12/2019 0834   LDLCALC 183 (H) 12/05/2022 0848   LDLDIRECT 104.1 04/12/2014 1608    Physical Exam:   VS:  LMP 11/17/2021 (Approximate)  , BMI There is no height or weight on file to calculate BMI. GENERAL:  Well appearing HEENT: Pupils equal round and reactive, fundi not visualized, oral mucosa unremarkable NECK:  No jugular venous distention, waveform within normal limits, carotid upstroke brisk and symmetric, no bruits, no thyromegaly LUNGS:  Clear to auscultation bilaterally HEART:  RRR.  PMI not displaced or sustained,S1 and S2 within normal limits, no S3, no S4, no clicks, no rubs, no murmurs ABD:  Flat, positive bowel sounds normal in frequency in pitch, no bruits, no rebound, no guarding, no midline pulsatile mass, no hepatomegaly, no splenomegaly EXT:  2 plus pulses throughout, no edema, no cyanosis no clubbing SKIN:  No rashes no nodules NEURO:   Cranial nerves II through XII grossly intact, motor grossly intact throughout PSYCH:  Cognitively intact, oriented to person place and time   ASSESSMENT/PLAN:    # HFrEF:  LVEF 35-40%.  She is euvolemic today but systolic function is declining.  She has NICM.  Awaiting heart transplant evaluation.  She thought that she was being referred to [facility].  Symptoms include fatigue, palpitations, and low energy. Medication adherence affected by vomiting and appetite loss. - Refer to advanced heart failure team to determine if she may be a heart/kidney transplant candidate and needs referral.  # SCD:  S/p ICD.   # Hypertension Chronic hypertension with fluctuating levels, especially during dialysis. Blood pressure often elevated pre-dialysis and drops during treatment, causing dizziness and vision loss. Medication adherence affected by vomiting and dumping syndrome. Target BP <140/90 mmHg to prevent hypotension during vomiting.  I need a more clear understanding of her BP pattern before making recommendations.  - Track blood pressure twice daily for two weeks and report via MyChart. - Discuss midodrine use during dialysis for hypotension.  # ESRD on Dialysis End-stage renal disease with significant BP fluctuations. Vomiting during dialysis complicates management. - Discuss BP management with nephrologist during dialysis visits.  # Dumping Syndrome Frequent vomiting, especially during dialysis, affects BP stability and medication adherence. - Monitor dietary intake to identify triggers.  She hasn't been able to find any thus far.   # Vision Loss Left eye vision loss since November due to BP fluctuations. - Discuss with ophthalmologist if further evaluation needed.   Screening for Secondary Hypertension:     Relevant Labs/Studies:    Latest Ref Rng & Units 09/18/2023    6:33 AM 07/18/2023    9:25 AM 07/17/2023    4:57 AM  Basic Labs  Sodium 135 - 145 mmol/L 134  135  135   Potassium 3.5 -  5.1 mmol/L 4.1  3.3  3.5   Creatinine 0.44 - 1.00 mg/dL 1.91  4.78  2.95        Latest Ref Rng & Units 10/21/2022    9:46 PM 07/22/2022    9:21 PM  Thyroid   TSH 0.350 - 4.500 uIU/mL 1.587  2.439       Disposition:    FU with MD/PharmD in 2 months    Medication Adjustments/Labs and Tests Ordered: Current medicines are reviewed at length with the patient today.  Concerns regarding medicines are outlined above.  No orders of the defined types were placed in this encounter.  No orders of the defined types were placed in this encounter.    Signed, Chilton Si, MD  10/20/2023 7:52 AM    [facility]

## 2023-10-20 NOTE — Progress Notes (Unsigned)
 POST OPERATIVE OFFICE NOTE    CC:  F/u for surgery  HPI:  This is a 53 y.o. female who is s/p left thigh AVG 09/18/2023 Dr. Lenell Antu.  She has a TDC that was placed in IR on 10/07/2022.  Pt states she does get cramping in the left foot during dialysis.  She does not have feeling in either foot.  She does not have any ulcerations on the left foot.    The pt is on dialysis is at [facility] [address] [facility] [address] location.  She is currently not on the transplant list because she has hx of dumping syndrome.  She has appt with [facility] in the near future.   HD access hx: -Right FA AVG 11/19/2022 Dr. Edilia Bo -RUA AVG 07/29/2023 Dr. Lenell Antu -left thigh AVG 09/18/2023 Dr. Lenell Antu  Allergies  Allergen Reactions   Atorvastatin Other (See Comments)    Dizziness    Dulaglutide Nausea And Vomiting and Other (See Comments)    Caused pancreatitis    Ivp Dye [Iodinated Contrast Media] Hives, Itching and Nausea And Vomiting    tremors   Metoclopramide Other (See Comments)    Possible movement disorder or tardive dyskinesia while on reglan Long term   Other Itching    Peas and carrots   Sertraline Nausea And Vomiting   Vancomycin Itching and Swelling   Zofran [Ondansetron] Nausea And Vomiting and Other (See Comments)    Causes nausea vomiting to worsen / doesn't really work for patient.   Amoxicillin Itching and Rash   Lantus [Insulin Glargine] Itching and Rash    Has tolerated insulin glargine many times with no reported issues   Miconazole Nitrate Nausea And Vomiting and Rash    Current Outpatient Medications  Medication Sig Dispense Refill   acetaminophen (TYLENOL) 500 MG tablet Take 1,000 mg by mouth every 8 (eight) hours as needed for moderate pain or mild pain.     albuterol (PROAIR HFA) 108 (90 Base) MCG/ACT inhaler Inhale 2 puffs into the lungs every 6 (six) hours as needed for wheezing or shortness of breath. 6.7 g 1   carvedilol (COREG) 3.125 MG tablet Take 1 tablet (3.125 mg total) by  mouth 4 (four) times a week. Take twice daily on non Dialysis days (Tues,Thur,Sat, Sun) (Patient not taking: Reported on 10/20/2023) 96 tablet 3   cloNIDine (CATAPRES) 0.1 MG tablet Take 0.1 mg by mouth daily as needed (bp > 180/120).     furosemide (LASIX) 40 MG tablet Take 40 mg by mouth daily as needed for fluid. (Patient not taking: Reported on 10/20/2023)     gabapentin (NEURONTIN) 300 MG capsule Take 300 mg by mouth at bedtime as needed (nerve pain).     hydrALAZINE (APRESOLINE) 50 MG tablet Take 1 tablet (50 mg total) by mouth 3 (three) times daily. Hold hydralazine before dialysis, and take after dialysis if [facility] blood pressure>120     hydrOXYzine (ATARAX) 50 MG tablet Take 50 mg by mouth 3 (three) times daily as needed for anxiety or itching.     insulin aspart (NOVOLOG) 100 UNIT/ML injection Inject 120 Units into the skin See admin instructions. Patient uses this with the Omnipod insulin pump  its vary according to blood sugar. Loads 120 units every 3 days. (Patient taking differently: Inject 150 Units into the skin See admin instructions. Patient uses this with the Omnipod insulin pump  its vary according to blood sugar. Loads 150 units every 3 days.)     Insulin Disposable Pump (OMNIPOD DASH  PODS, GEN 4,) MISC Place 1 Applicatorful onto the skin See admin instructions. Every three days     isosorbide dinitrate (ISORDIL) 20 MG tablet Take 1 tablet (20 mg total) by mouth 3 (three) times daily. Please hold Imdur before HD, and take after the if systolic blood pressure>120     LORazepam (ATIVAN) 0.5 MG tablet Take 1 tablet (0.5 mg total) by mouth every 8 (eight) hours as needed for anxiety (Refractory nausea and vomiting). (Patient taking differently: Take 0.5 mg by mouth every 6 (six) hours as needed for anxiety (Refractory nausea and vomiting).) 30 tablet 0   metoCLOPramide (REGLAN) 10 MG tablet Take 10 mg by mouth every 8 (eight) hours as needed for vomiting or nausea.     nitroGLYCERIN  (NITROSTAT) 0.4 MG SL tablet Place 1 tablet (0.4 mg total) under the tongue every 5 (five) minutes as needed for chest pain. 90 tablet 3   oxyCODONE-acetaminophen (PERCOCET) 5-325 MG tablet Take 1 tablet by mouth every 6 (six) hours as needed for severe pain (pain score 7-10). 20 tablet 0   pantoprazole (PROTONIX) 40 MG tablet Take 1 tablet (40 mg total) by mouth 2 (two) times daily. 60 tablet 1   polyethylene glycol (MIRALAX / GLYCOLAX) 17 g packet Take 17 g by mouth daily as needed for moderate constipation.     pregabalin (LYRICA) 50 MG capsule Take 50 mg by mouth daily as needed (neuropathy).     prochlorperazine (COMPAZINE) 5 MG tablet Take 1 tablet (5 mg total) by mouth every 6 (six) hours as needed for nausea or vomiting. 15 tablet 0   RABEprazole (ACIPHEX) 20 MG tablet Take 20 mg by mouth daily.     tobramycin (TOBREX) 0.3 % ophthalmic solution Place 1 drop into both eyes See admin instructions. Instill 1 drop into both eyes once every 8 weeks before eye injections     TRESIBA FLEXTOUCH 100 UNIT/ML FlexTouch Pen Inject into the skin as needed (when insulin pump is not working).     No current facility-administered medications for this visit.   Facility-Administered Medications Ordered in Other Visits  Medication Dose Route Frequency Provider Last Rate Last Admin   alteplase (CATHFLO ACTIVASE) injection 2 mg  2 mg Intracatheter Once PRN Oretha Milch, PA-C       anticoagulant sodium citrate solution 5 mL  5 mL Intracatheter PRN Thomasena Edis, Kaylyne Axton G, PA-C       heparin injection 1,000 Units  1,000 Units Intracatheter PRN Oretha Milch, PA-C   5,000 Units at 07/17/23 0829   lidocaine (PF) (XYLOCAINE) 1 % injection 5 mL  5 mL Intradermal PRN Oretha Milch, PA-C       lidocaine-prilocaine (EMLA) cream 1 Application  1 Application Topical PRN Collins, Quenten Raven, PA-C       pentafluoroprop-tetrafluoroeth (GEBAUERS) aerosol 1 Application  1 Application Topical PRN Oretha Milch, PA-C         ROS:  See HPI  Physical Exam:  Today's Vitals   10/21/23 0955 10/21/23 0957  BP: 131/80   Pulse: 94   Resp: 16   Temp: 97.6 F (36.4 C)   TempSrc: Temporal   SpO2: 98%   Weight: 172 lb (78 kg)   Height: 6' (1.829 m)   PainSc: 0-No pain 0-No pain   Body mass index is 23.33 kg/m.   Incision:  left groin and counter incisions have healed nicely.  Extremities:   She has biphasic flow bilateral feet There is a thrill/bruit  present.  Access is  easily palpable   Dialysis Duplex on 10/20/2023: Patent AVG  Non-Invasive Vascular Imaging:   ABI's/TBI's on 10/14/2023: Right:  1.21/1.0 - Great toe pressure: 177 Left:  0.84/0.69 - Great toe pressure: 122   Assessment/Plan:  This is a 53 y.o. female who is s/p: -left thigh AVG 09/18/2023 Dr. Lenell Antu  -discussed the results of duplex with Dr. Benjaman Kindler likely perigraft fluid seen is from "graft sweating".  Her incisions have healed nicely and no evidence of infection.  -discussed steal syndrome with pt.  She does have some cramping in the left foot on dialysis.  She does not have any wounds.  She does not have feeling in either foot.  Instructed her to protect her feet.  If she gets any wounds that are not healing or slow to heal, especially in the left foot, she will contact us.   -pt's access can be used now. -if pt has tunneled catheter, this can be removed at the discretion of the dialysis center once the pt's access has been successfully cannulated to their satisfaction.  -discussed with pt that access does not last forever and will need intervention or even new access at some point.  -the pt will follow up as needed   Doreatha Massed, [facility] [phone]  Clinic MD:  Chestine Spore

## 2023-10-20 NOTE — Patient Instructions (Signed)
 Medication Instructions:  Your physician recommends that you continue on your current medications as directed. Please refer to the Current Medication list given to you today.  Labwork: NONE  Testing/Procedures: NONE   Follow-Up: 2 MONTHS WITH CAITLIN W NP OR DR Metuchen   Any Other Special Instructions Will Be Listed Below (If Applicable). You have been referred to HEART FAILURE CLINIC. IF YOU DO NOT HEAR FROM THEM IN 2 WEEKS LET us KNOW   MONITOR YOUR BLOOD PRESSURE TWICE A DAY. IN 2 WEEKS SEND MYCHART MESSAGE WITH YOUR READINGS   If you need a refill on your cardiac medications before your next appointment, please call your pharmacy.

## 2023-10-21 ENCOUNTER — Ambulatory Visit (INDEPENDENT_AMBULATORY_CARE_PROVIDER_SITE_OTHER): Payer: BC Managed Care – PPO | Admitting: Physician Assistant

## 2023-10-21 VITALS — BP 131/80 | HR 94 | Temp 97.6°F | Resp 16 | Ht 72.0 in | Wt 172.0 lb

## 2023-10-21 DIAGNOSIS — Z992 Dependence on renal dialysis: Secondary | ICD-10-CM

## 2023-10-21 DIAGNOSIS — N186 End stage renal disease: Secondary | ICD-10-CM

## 2023-10-22 DIAGNOSIS — N2581 Secondary hyperparathyroidism of renal origin: Secondary | ICD-10-CM | POA: Diagnosis not present

## 2023-10-22 DIAGNOSIS — T8249XA Other complication of vascular dialysis catheter, initial encounter: Secondary | ICD-10-CM | POA: Diagnosis not present

## 2023-10-22 DIAGNOSIS — N186 End stage renal disease: Secondary | ICD-10-CM | POA: Diagnosis not present

## 2023-10-22 DIAGNOSIS — Z992 Dependence on renal dialysis: Secondary | ICD-10-CM | POA: Diagnosis not present

## 2023-10-24 DIAGNOSIS — N186 End stage renal disease: Secondary | ICD-10-CM | POA: Diagnosis not present

## 2023-10-24 DIAGNOSIS — Z992 Dependence on renal dialysis: Secondary | ICD-10-CM | POA: Diagnosis not present

## 2023-10-24 DIAGNOSIS — T8249XA Other complication of vascular dialysis catheter, initial encounter: Secondary | ICD-10-CM | POA: Diagnosis not present

## 2023-10-24 DIAGNOSIS — N2581 Secondary hyperparathyroidism of renal origin: Secondary | ICD-10-CM | POA: Diagnosis not present

## 2023-10-27 DIAGNOSIS — Z992 Dependence on renal dialysis: Secondary | ICD-10-CM | POA: Diagnosis not present

## 2023-10-27 DIAGNOSIS — N186 End stage renal disease: Secondary | ICD-10-CM | POA: Diagnosis not present

## 2023-10-27 DIAGNOSIS — N2581 Secondary hyperparathyroidism of renal origin: Secondary | ICD-10-CM | POA: Diagnosis not present

## 2023-10-27 DIAGNOSIS — T8249XA Other complication of vascular dialysis catheter, initial encounter: Secondary | ICD-10-CM | POA: Diagnosis not present

## 2023-10-29 DIAGNOSIS — N2581 Secondary hyperparathyroidism of renal origin: Secondary | ICD-10-CM | POA: Diagnosis not present

## 2023-10-29 DIAGNOSIS — T8249XA Other complication of vascular dialysis catheter, initial encounter: Secondary | ICD-10-CM | POA: Diagnosis not present

## 2023-10-29 DIAGNOSIS — N186 End stage renal disease: Secondary | ICD-10-CM | POA: Diagnosis not present

## 2023-10-29 DIAGNOSIS — Z992 Dependence on renal dialysis: Secondary | ICD-10-CM | POA: Diagnosis not present

## 2023-10-31 DIAGNOSIS — T8249XA Other complication of vascular dialysis catheter, initial encounter: Secondary | ICD-10-CM | POA: Diagnosis not present

## 2023-10-31 DIAGNOSIS — Z992 Dependence on renal dialysis: Secondary | ICD-10-CM | POA: Diagnosis not present

## 2023-10-31 DIAGNOSIS — N2581 Secondary hyperparathyroidism of renal origin: Secondary | ICD-10-CM | POA: Diagnosis not present

## 2023-10-31 DIAGNOSIS — N186 End stage renal disease: Secondary | ICD-10-CM | POA: Diagnosis not present

## 2023-11-03 DIAGNOSIS — Z992 Dependence on renal dialysis: Secondary | ICD-10-CM | POA: Diagnosis not present

## 2023-11-03 DIAGNOSIS — N2581 Secondary hyperparathyroidism of renal origin: Secondary | ICD-10-CM | POA: Diagnosis not present

## 2023-11-03 DIAGNOSIS — N186 End stage renal disease: Secondary | ICD-10-CM | POA: Diagnosis not present

## 2023-11-03 DIAGNOSIS — I129 Hypertensive chronic kidney disease with stage 1 through stage 4 chronic kidney disease, or unspecified chronic kidney disease: Secondary | ICD-10-CM | POA: Diagnosis not present

## 2023-11-03 DIAGNOSIS — T8249XA Other complication of vascular dialysis catheter, initial encounter: Secondary | ICD-10-CM | POA: Diagnosis not present

## 2023-11-05 DIAGNOSIS — N186 End stage renal disease: Secondary | ICD-10-CM | POA: Diagnosis not present

## 2023-11-05 DIAGNOSIS — Z992 Dependence on renal dialysis: Secondary | ICD-10-CM | POA: Diagnosis not present

## 2023-11-05 DIAGNOSIS — N2581 Secondary hyperparathyroidism of renal origin: Secondary | ICD-10-CM | POA: Diagnosis not present

## 2023-11-05 DIAGNOSIS — T8249XA Other complication of vascular dialysis catheter, initial encounter: Secondary | ICD-10-CM | POA: Diagnosis not present

## 2023-11-06 ENCOUNTER — Encounter (HOSPITAL_COMMUNITY)
Admission: RE | Disposition: A | Discharge status: Home / Self Care | Payer: Self-pay | Source: Home / Self Care | Attending: Nephrology

## 2023-11-06 ENCOUNTER — Other Ambulatory Visit: Payer: Self-pay

## 2023-11-06 ENCOUNTER — Ambulatory Visit (HOSPITAL_COMMUNITY)
Admission: RE | Admit: 2023-11-06 | Discharge: 2023-11-06 | Disposition: A | Discharge status: Home / Self Care | Attending: Nephrology | Admitting: Nephrology

## 2023-11-06 HISTORY — PX: A/V SHUNT INTERVENTION: CATH118220

## 2023-11-06 SURGERY — INVASIVE LAB ABORTED CASE

## 2023-11-06 MED ORDER — LIDOCAINE HCL (PF) 1 % IJ SOLN
INTRAMUSCULAR | Status: AC
  Filled 2023-11-06: qty 30

## 2023-11-06 SURGICAL SUPPLY — 3 items
BAG SNAP BAND KOVER 36X36 (MISCELLANEOUS) ×3 IMPLANT
COVER DOME SNAP 22 D (MISCELLANEOUS) ×3 IMPLANT
TRAY PV CATH (CUSTOM PROCEDURE TRAY) ×3 IMPLANT

## 2023-11-06 NOTE — H&P (Signed)
 Chief Complaint: Difficult cannulation   Interval H&P  The patient has presented today for an angiogram/ angioplasty but on examination there is a very strong bruit in the AV graft, ultrasound showed that the venous limb was on the deeper side with evidence of posterior wall injury that was not actively bleeding.  I do not believe she needs a procedure as the flows and the access is very rapid and I marked the arterial limb which was lateral where cannulation should not be an issue.  Would avoid cannulating the venous limb..  I have reviewed the patient's chart and labs.  Questions were answered to the patient's satisfaction.  Assessment/Plan: ESRD dialyzing MWF. She has a left thigh AV graft from the superficial femoral artery to the greater saphenous vein performed on September 18, 2023 by Dr. Lenell Antu.  History of contrast reaction and she has been premedicated.  - Was planning on angiogram with possibly angioplasty on auscultation there is a very strong bruit throughout the graft and on ultrasound there is no evidence of any stenotic areas.  -Do not believe she needs a procedure; may want to only cannulate the arterial limb which is lateral and rotate sites.  I marked the zones that can be cannulated on the arterial/lateral limb.  The medial limb (venous return) is a little bit on the deeper side there is evidence of small hematomas and back wall injury.    Renal osteodystrophy - continue binders per home regimen. Anemia - managed with ESA's and IV iron at dialysis center. HTN - resume home regimen.   HPI: [name] is an 53 y.o. female with a history of AICD, CHF, ESRD, gastroparesis, hyperlipidemia, coronary atherosclerotic heart disease, diabetes.  Here because of difficult cannulation in the medial/venous limb of the left femoral graft.  ROS Per HPI.  Chemistry and CBC: Creatinine, Ser  Date/Time Value Ref Range Status  09/18/2023 06:33 AM 6.90 (H) 0.44 - 1.00 mg/dL Final   54/04/8118 14:78 AM 6.96 (H) 0.44 - 1.00 mg/dL Final  29/56/2130 86:57 AM 5.51 (H) 0.44 - 1.00 mg/dL Final  84/69/6295 28:41 AM 7.10 (H) 0.44 - 1.00 mg/dL Final  32/44/0102 72:53 PM 5.50 (H) 0.44 - 1.00 mg/dL Final  66/44/0347 42:59 AM 5.30 (H) 0.44 - 1.00 mg/dL Final  56/38/7564 33:29 PM 8.35 (H) 0.44 - 1.00 mg/dL Final  51/88/4166 06:30 AM 8.60 (H) 0.44 - 1.00 mg/dL Final  16/08/930 35:57 AM 7.69 (H) 0.44 - 1.00 mg/dL Final  32/20/2542 70:62 AM 4.58 (H) 0.44 - 1.00 mg/dL Final  37/62/8315 17:61 PM 5.25 (H) 0.44 - 1.00 mg/dL Final  60/73/7106 26:94 AM 5.10 (H) 0.44 - 1.00 mg/dL Final  85/46/2703 50:09 AM 4.97 (H) 0.44 - 1.00 mg/dL Final  38/18/2993 71:69 AM 3.60 (H) 0.44 - 1.00 mg/dL Final  67/89/3810 17:51 AM 5.01 (H) 0.44 - 1.00 mg/dL Final  02/58/5277 82:42 AM 3.87 (H) 0.44 - 1.00 mg/dL Final  35/36/1443 15:40 AM 2.78 (H) 0.44 - 1.00 mg/dL Final  08/67/6195 09:32 AM 3.81 (H) 0.44 - 1.00 mg/dL Final  67/07/4579 99:83 PM 3.67 (H) 0.44 - 1.00 mg/dL Final  38/25/0539 76:73 AM 3.39 (H) 0.44 - 1.00 mg/dL Final  41/93/7902 40:97 AM 4.17 (H) 0.44 - 1.00 mg/dL Final  35/32/9924 26:83 AM 5.21 (H) 0.44 - 1.00 mg/dL Final  41/96/2229 79:89 AM 3.63 (H) 0.44 - 1.00 mg/dL Final  21/19/4174 08:14 AM 5.26 (H) 0.44 - 1.00 mg/dL Final  48/18/5631 49:70 AM 4.51 (H) 0.44 - 1.00 mg/dL Final  10/09/2022 06:02 AM 6.21 (H) 0.44 - 1.00 mg/dL Final  16/05/9603 54:09 AM 4.62 (H) 0.44 - 1.00 mg/dL Final  81/19/1478 29:56 AM 5.31 (H) 0.44 - 1.00 mg/dL Final  21/30/8657 84:69 AM 4.73 (H) 0.44 - 1.00 mg/dL Final  62/95/2841 32:44 AM 4.12 (H) 0.44 - 1.00 mg/dL Final  08/07/7251 66:44 AM 3.61 (H) 0.44 - 1.00 mg/dL Final  03/47/4259 56:38 AM 4.11 (H) 0.44 - 1.00 mg/dL Final  75/64/3329 51:88 AM 3.87 (H) 0.44 - 1.00 mg/dL Final  41/66/0630 16:01 AM 3.52 (H) 0.44 - 1.00 mg/dL Final  09/32/3557 32:20 AM 3.18 (H) 0.44 - 1.00 mg/dL Final  25/42/7062 37:62 AM 4.12 (H) 0.44 - 1.00 mg/dL Final  83/15/1761 60:73 AM  3.62 (H) 0.44 - 1.00 mg/dL Final  71/01/2693 85:46 AM 3.49 (H) 0.44 - 1.00 mg/dL Final  27/10/5007 38:18 PM 3.49 (H) 0.44 - 1.00 mg/dL Final  29/93/7169 67:89 PM 3.45 (H) 0.44 - 1.00 mg/dL Final  38/05/1750 02:58 PM 3.40 (H) 0.44 - 1.00 mg/dL Final  52/77/8242 35:36 AM 3.61 (H) 0.44 - 1.00 mg/dL Final  14/43/1540 08:67 PM 3.26 (H) 0.44 - 1.00 mg/dL Final  61/95/0932 67:12 AM 3.37 (H) 0.44 - 1.00 mg/dL Final  45/80/9983 38:25 PM 3.22 (H) 0.44 - 1.00 mg/dL Final  05/39/7673 41:93 PM 3.04 (H) 0.44 - 1.00 mg/dL Final  79/09/4095 35:32 AM 3.32 (H) 0.44 - 1.00 mg/dL Final  99/24/2683 41:96 AM 3.15 (H) 0.44 - 1.00 mg/dL Final  22/29/7989 21:19 AM 3.73 (H) 0.44 - 1.00 mg/dL Final  41/74/0814 48:18 AM 3.13 (H) 0.44 - 1.00 mg/dL Final  56/31/4970 26:37 AM 3.14 (H) 0.44 - 1.00 mg/dL Final  85/88/5027 74:12 PM 3.15 (H) 0.44 - 1.00 mg/dL Final   No results for input(s): "NA", "K", "CL", "CO2", "GLUCOSE", "BUN", "CREATININE", "CALCIUM", "PHOS" in the last 168 hours.  Invalid input(s): "ALB" No results for input(s): "WBC", "NEUTROABS", "HGB", "HCT", "MCV", "PLT" in the last 168 hours. Liver Function Tests: No results for input(s): "AST", "ALT", "ALKPHOS", "BILITOT", "PROT", "ALBUMIN" in the last 168 hours. No results for input(s): "LIPASE", "AMYLASE" in the last 168 hours. No results for input(s): "AMMONIA" in the last 168 hours. Cardiac Enzymes: No results for input(s): "CKTOTAL", "CKMB", "CKMBINDEX", "TROPONINI" in the last 168 hours. Iron Studies: No results for input(s): "IRON", "TIBC", "TRANSFERRIN", "FERRITIN" in the last 72 hours. PT/INR: @LABRCNTIP (inr:5)  Xrays/Other Studies: )No results found for this or any previous visit (from the past 48 hours). No results found.  PMH:   Past Medical History:  Diagnosis Date   AICD (automatic cardioverter/defibrillator) present    [employer]   Anemia    Anxiety    Arthritis    Asthma    CHF (congestive heart failure) (HCC)    Chronic  diastolic CHF (congestive heart failure) (HCC) 12/14/2019   Depression    Diabetic ulcer of left foot (HCC) 05/12/2013   ESRD on hemodialysis (HCC)    MWF at [facility]   Gastroparesis    Generalized abdominal pain    History of chicken pox    Loss of weight 12/02/2019   Migraines    Mixed hyperlipidemia 09/05/2022   Mood disorder (HCC)    anxiety   Myocardial infarction (HCC)    NSTEMI (non-ST elevated myocardial infarction) (HCC) 05/13/2022   Prurigo nodularis    with diabetic dermopathy   Type 1 diabetes mellitus (HCC) 05/23/2022   Type 1 diabetes, uncontrolled, with neuropathy    [place]   Ulcers of both lower  legs (HCC) 02/20/2014    PSH:   Past Surgical History:  Procedure Laterality Date   AV FISTULA PLACEMENT Left 10/01/2022   Procedure: ARTERIOVENOUS (AV)FISTULA CREATION;  Surgeon: Larina Earthly, MD;  Location: [facility] ORS;  Service: Vascular;  Laterality: Left;   AV FISTULA PLACEMENT Right 11/19/2022   Procedure: INSERTION OF RIGHT ARM ARTERIOVENOUS (AV) GORE-TEX GRAFT;  Surgeon: Chuck Hint, MD;  Location: [facility] OR;  Service: Vascular;  Laterality: Right;   AV FISTULA PLACEMENT Right 07/17/2023   Procedure: INSERTION OF ARTERIOVENOUS (AV) GOR-TEX GRAFT RIGHT ARM;  Surgeon: Leonie Douglas, MD;  Location: [facility] OR;  Service: Vascular;  Laterality: Right;   AV FISTULA PLACEMENT Left 09/18/2023   Procedure: INSERTION OF ARTERIOVENOUS (AV) GORE-TEX GRAFT THIGH;  Surgeon: Leonie Douglas, MD;  Location: [facility] OR;  Service: Vascular;  Laterality: Left;   BIOPSY  08/11/2020   Procedure: BIOPSY;  Surgeon: Meryl Dare, MD;  Location: [facility] ENDOSCOPY;  Service: Endoscopy;;   BIOPSY  04/02/2022   Procedure: BIOPSY;  Surgeon: Lynann Bologna, MD;  Location: [facility] ENDOSCOPY;  Service: Gastroenterology;;   ESOPHAGOGASTRODUODENOSCOPY (EGD) WITH PROPOFOL N/A 08/11/2020   Procedure: ESOPHAGOGASTRODUODENOSCOPY (EGD) WITH PROPOFOL;  Surgeon: Meryl Dare, MD;  Location: [facility] ENDOSCOPY;  Service:  Endoscopy;  Laterality: N/A;   ESOPHAGOGASTRODUODENOSCOPY (EGD) WITH PROPOFOL N/A 04/02/2022   Procedure: ESOPHAGOGASTRODUODENOSCOPY (EGD) WITH PROPOFOL;  Surgeon: Lynann Bologna, MD;  Location: [facility] ENDOSCOPY;  Service: Gastroenterology;  Laterality: N/A;   ESOPHAGOGASTRODUODENOSCOPY (EGD) WITH PROPOFOL N/A 05/23/2022   Procedure: ESOPHAGOGASTRODUODENOSCOPY (EGD) WITH PROPOFOL;  Surgeon: Midge Minium, MD;  Location: [facility] ENDOSCOPY;  Service: Endoscopy;  Laterality: N/A;   ESOPHAGOGASTRODUODENOSCOPY (EGD) WITH PROPOFOL N/A 07/15/2023   Procedure: ESOPHAGOGASTRODUODENOSCOPY (EGD) WITH PROPOFOL;  Surgeon: Charlott Rakes, MD;  Location: [facility] ENDOSCOPY;  Service: Gastroenterology;  Laterality: N/A;   ICD IMPLANT N/A 04/09/2022   Procedure: ICD IMPLANT;  Surgeon: [name], MD;  Location: [facility] INVASIVE CV LAB;  Service: Cardiovascular;  Laterality: N/A;   IR FLUORO GUIDE CV LINE RIGHT  10/07/2022   IR US GUIDE VASC ACCESS RIGHT  10/07/2022   treadmill stress test  01/2013   WNL, low risk study    Allergies:  Allergies  Allergen Reactions   Atorvastatin Other (See Comments)    Dizziness    Dulaglutide Nausea And Vomiting and Other (See Comments)    Caused pancreatitis    Ivp Dye [Iodinated Contrast Media] Hives, Itching and Nausea And Vomiting    tremors   Metoclopramide Other (See Comments)    Possible movement disorder or tardive dyskinesia while on reglan Long term   Other Itching    Peas and carrots   Sertraline Nausea And Vomiting   Vancomycin Itching and Swelling   Zofran [Ondansetron] Nausea And Vomiting and Other (See Comments)    Causes nausea vomiting to worsen / doesn't really work for patient.   Amoxicillin Itching and Rash   Lantus [Insulin Glargine] Itching and Rash    Has tolerated insulin glargine many times with no reported issues   Miconazole Nitrate Nausea And Vomiting and Rash    Medications:   Prior to Admission medications   Medication Sig Start Date End Date  Taking? Authorizing Provider  acetaminophen (TYLENOL) 500 MG tablet Take 1,000 mg by mouth every 8 (eight) hours as needed for moderate pain or mild pain.   Yes [provider]  albuterol (PROAIR HFA) 108 (90 Base) MCG/ACT inhaler Inhale 2 puffs into the lungs every 6 (six) hours as  needed for wheezing or shortness of breath. 07/03/20  Yes Waldon Merl, PA-C  calcium carbonate (TUMS EX) 750 MG chewable tablet Chew 2 tablets by mouth daily.   Yes [provider]  gabapentin (NEURONTIN) 300 MG capsule Take 300 mg by mouth at bedtime as needed (nerve pain).   Yes [provider]  hydrALAZINE (APRESOLINE) 50 MG tablet Take 1 tablet (50 mg total) by mouth 3 (three) times daily. Hold hydralazine before dialysis, and take after dialysis if [facility] blood pressure>120 07/18/23  Yes Elgergawy, Leana Roe, MD  hydrOXYzine (ATARAX) 50 MG tablet Take 50 mg by mouth 3 (three) times daily as needed for anxiety.   Yes [provider]  insulin aspart (NOVOLOG) 100 UNIT/ML injection Inject 120 Units into the skin See admin instructions. Patient uses this with the Omnipod insulin pump  its vary according to blood sugar. Loads 120 units every 3 days. Patient taking differently: Inject 150 Units into the skin See admin instructions. Patient uses this with the Omnipod insulin pump  its vary according to blood sugar. Loads 150 units every 3 days. 05/02/23  Yes Ghimire, Werner Lean, MD  Insulin Disposable Pump (OMNIPOD DASH PODS, GEN 4,) MISC Place 1 Applicatorful onto the skin See admin instructions. Every three days   Yes [provider]  isosorbide dinitrate (ISORDIL) 20 MG tablet Take 1 tablet (20 mg total) by mouth 3 (three) times daily. Please hold Imdur before HD, and take after the if systolic blood pressure>120 07/18/23  Yes Elgergawy, Leana Roe, MD  LORazepam (ATIVAN) 0.5 MG tablet Take 1 tablet (0.5 mg total) by mouth every 8 (eight) hours as needed for anxiety (Refractory  nausea and vomiting). Patient taking differently: Take 0.5 mg by mouth 3 (three) times daily. 07/17/22  Yes Zannie Cove, MD  metoCLOPramide (REGLAN) 10 MG tablet Take 10 mg by mouth every 8 (eight) hours as needed for vomiting or nausea.   Yes [provider]  nitroGLYCERIN (NITROSTAT) 0.4 MG SL tablet Place 1 tablet (0.4 mg total) under the tongue every 5 (five) minutes as needed for chest pain. 09/05/22 07/14/24 Yes Chandrasekhar, Mahesh A, MD  polyethylene glycol (MIRALAX / GLYCOLAX) 17 g packet Take 17 g by mouth daily as needed for moderate constipation.   Yes [provider]  pregabalin (LYRICA) 50 MG capsule Take 50 mg by mouth daily as needed (neuropathy). 12/05/21  Yes [provider]  prochlorperazine (COMPAZINE) 5 MG tablet Take 1 tablet (5 mg total) by mouth every 6 (six) hours as needed for nausea or vomiting. 05/01/23  Yes Ghimire, Werner Lean, MD  RABEprazole (ACIPHEX) 20 MG tablet Take 20 mg by mouth 2 (two) times daily.   Yes [provider]  tobramycin (TOBREX) 0.3 % ophthalmic solution Place 1 drop into both eyes See admin instructions. Instill 1 drop into both eyes once every 8 weeks before eye injections 04/21/23  Yes [provider]  TRESIBA FLEXTOUCH 100 UNIT/ML FlexTouch Pen Inject into the skin as needed (when insulin pump is not working). 06/02/23  Yes [provider]  carvedilol (COREG) 3.125 MG tablet Take 1 tablet (3.125 mg total) by mouth 4 (four) times a week. Take twice daily on non Dialysis days (Tues,Thur,Sat, Sun) Patient not taking: Reported on 10/20/2023 06/17/23   Christell Constant, MD  pantoprazole (PROTONIX) 40 MG tablet Take 1 tablet (40 mg total) by mouth 2 (two) times daily. Patient not taking: Reported on 11/04/2023 07/18/23 09/16/23  Elgergawy, Leana Roe, MD  Discontinued Meds:   Medications Discontinued During This Encounter  Medication Reason   cloNIDine (CATAPRES) 0.1 MG tablet Patient Preference    furosemide (LASIX) 40 MG tablet Patient Preference   oxyCODONE-acetaminophen (PERCOCET) 5-325 MG tablet Patient Preference    Social History:  reports that she has never smoked. She has never used smokeless tobacco. She reports that she does not currently use alcohol. She reports that she does not use drugs.  Family History:   Family History  Problem Relation Age of Onset   Diabetes Mother        type 2   Hypertension Mother    Thyroid disease Mother    Bipolar disorder Mother    Heart disease Mother    Calcium disorder Mother    Cancer Maternal Grandmother        Breast, stomach   Cancer Paternal Grandmother        stomach, lung (smoker)   Diabetes Paternal Grandmother    Diabetes Paternal Grandfather    Heart failure Sister    Diabetes Sister    Stroke Maternal Aunt    Cancer Maternal Uncle        prostate   CAD Maternal Aunt        stents   Cancer Maternal Aunt 20       ovarian    Last menstrual period 11/17/2021. GEN: NAD, A&Ox3, NCAT HEENT: No conjunctival pallor, EOMI NECK: Supple, no thyromegaly LUNGS: CTA B/L no rales, rhonchi or wheezing CV: RRR, No M/R/G ABD: SNDNT +BS  EXT: Left femoral AV graft with a strong bruit, RIJ tunnel catheter         Ethelene Hal, MD 11/06/2023, 7:37 AM

## 2023-11-07 ENCOUNTER — Encounter (HOSPITAL_COMMUNITY): Payer: Self-pay | Admitting: Nephrology

## 2023-11-07 DIAGNOSIS — T8249XA Other complication of vascular dialysis catheter, initial encounter: Secondary | ICD-10-CM | POA: Diagnosis not present

## 2023-11-07 DIAGNOSIS — Z992 Dependence on renal dialysis: Secondary | ICD-10-CM | POA: Diagnosis not present

## 2023-11-07 DIAGNOSIS — N186 End stage renal disease: Secondary | ICD-10-CM | POA: Diagnosis not present

## 2023-11-07 DIAGNOSIS — N2581 Secondary hyperparathyroidism of renal origin: Secondary | ICD-10-CM | POA: Diagnosis not present

## 2023-11-10 DIAGNOSIS — T8249XA Other complication of vascular dialysis catheter, initial encounter: Secondary | ICD-10-CM | POA: Diagnosis not present

## 2023-11-10 DIAGNOSIS — N2581 Secondary hyperparathyroidism of renal origin: Secondary | ICD-10-CM | POA: Diagnosis not present

## 2023-11-10 DIAGNOSIS — Z992 Dependence on renal dialysis: Secondary | ICD-10-CM | POA: Diagnosis not present

## 2023-11-10 DIAGNOSIS — N186 End stage renal disease: Secondary | ICD-10-CM | POA: Diagnosis not present

## 2023-11-12 DIAGNOSIS — N2581 Secondary hyperparathyroidism of renal origin: Secondary | ICD-10-CM | POA: Diagnosis not present

## 2023-11-12 DIAGNOSIS — Z992 Dependence on renal dialysis: Secondary | ICD-10-CM | POA: Diagnosis not present

## 2023-11-12 DIAGNOSIS — T8249XA Other complication of vascular dialysis catheter, initial encounter: Secondary | ICD-10-CM | POA: Diagnosis not present

## 2023-11-12 DIAGNOSIS — N186 End stage renal disease: Secondary | ICD-10-CM | POA: Diagnosis not present

## 2023-11-13 ENCOUNTER — Encounter (HOSPITAL_COMMUNITY): Payer: Self-pay | Admitting: Cardiology

## 2023-11-13 ENCOUNTER — Ambulatory Visit (HOSPITAL_COMMUNITY)
Admission: RE | Admit: 2023-11-13 | Discharge: 2023-11-13 | Disposition: A | Discharge status: Home / Self Care | Source: Ambulatory Visit | Attending: Cardiology | Admitting: Cardiology

## 2023-11-13 VITALS — BP 110/80 | HR 95 | Wt 176.0 lb

## 2023-11-13 DIAGNOSIS — I5042 Chronic combined systolic (congestive) and diastolic (congestive) heart failure: Secondary | ICD-10-CM

## 2023-11-13 DIAGNOSIS — Z794 Long term (current) use of insulin: Secondary | ICD-10-CM | POA: Diagnosis not present

## 2023-11-13 DIAGNOSIS — Z8249 Family history of ischemic heart disease and other diseases of the circulatory system: Secondary | ICD-10-CM | POA: Insufficient documentation

## 2023-11-13 DIAGNOSIS — I5022 Chronic systolic (congestive) heart failure: Secondary | ICD-10-CM | POA: Insufficient documentation

## 2023-11-13 DIAGNOSIS — E1043 Type 1 diabetes mellitus with diabetic autonomic (poly)neuropathy: Secondary | ICD-10-CM | POA: Insufficient documentation

## 2023-11-13 DIAGNOSIS — N186 End stage renal disease: Secondary | ICD-10-CM | POA: Insufficient documentation

## 2023-11-13 DIAGNOSIS — Z95810 Presence of automatic (implantable) cardiac defibrillator: Secondary | ICD-10-CM | POA: Insufficient documentation

## 2023-11-13 DIAGNOSIS — K3184 Gastroparesis: Secondary | ICD-10-CM | POA: Diagnosis not present

## 2023-11-13 DIAGNOSIS — I132 Hypertensive heart and chronic kidney disease with heart failure and with stage 5 chronic kidney disease, or end stage renal disease: Secondary | ICD-10-CM | POA: Insufficient documentation

## 2023-11-13 DIAGNOSIS — Z992 Dependence on renal dialysis: Secondary | ICD-10-CM | POA: Diagnosis not present

## 2023-11-13 DIAGNOSIS — I429 Cardiomyopathy, unspecified: Secondary | ICD-10-CM | POA: Diagnosis not present

## 2023-11-13 DIAGNOSIS — Z8674 Personal history of sudden cardiac arrest: Secondary | ICD-10-CM | POA: Diagnosis not present

## 2023-11-13 DIAGNOSIS — Z9641 Presence of insulin pump (external) (internal): Secondary | ICD-10-CM | POA: Diagnosis not present

## 2023-11-13 DIAGNOSIS — E1022 Type 1 diabetes mellitus with diabetic chronic kidney disease: Secondary | ICD-10-CM | POA: Diagnosis not present

## 2023-11-13 DIAGNOSIS — E7849 Other hyperlipidemia: Secondary | ICD-10-CM | POA: Diagnosis not present

## 2023-11-13 DIAGNOSIS — R39198 Other difficulties with micturition: Secondary | ICD-10-CM | POA: Diagnosis not present

## 2023-11-13 MED ORDER — ENTRESTO 24-26 MG PO TABS: ORAL_TABLET | ORAL | 11 refills | Status: AC

## 2023-11-13 MED ORDER — TORSEMIDE 20 MG PO TABS: ORAL_TABLET | ORAL | 11 refills | Status: AC

## 2023-11-13 NOTE — Progress Notes (Signed)
 TTR genetic testing collected via Blood per Dr Elwyn Lade.  Order form completed, signed and shipped with sample by [employer] to [facility].

## 2023-11-13 NOTE — Patient Instructions (Signed)
 STOP isodril  STOP Hydralazine.  START Entresto 24/26 mg Twice daily on non dialysis days  START Torsemide 40 mg daily on non dialysis days.  Labs done today, your results will be available in MyChart, we will contact you for abnormal readings.  Genetic testing has been collected, this has to be sent to [facility] for processing and can take 1-2 weeks for [place] to get results back.  We will let you know the results once reviewed by your provider.  Please follow up with our heart failure pharmacist as scheduled.  Your physician recommends that you schedule a follow-up appointment in: 3 months ( July ) ** PLEASE CALL THE OFFICE IN [place] TO ARRANGE YOUR FOLLOW UP APPOINTMENT.**  If you have any questions or concerns before your next appointment please send [place] a message through [place] or call our office at [phone].    TO LEAVE A MESSAGE FOR THE NURSE SELECT OPTION 2, PLEASE LEAVE A MESSAGE INCLUDING: YOUR NAME DATE OF BIRTH CALL BACK NUMBER REASON FOR CALL**this is important as we prioritize the call backs  YOU WILL RECEIVE A CALL BACK THE SAME DAY AS LONG AS YOU CALL BEFORE 4:00 PM  At the [facility], you and your health needs are our priority. As part of our continuing mission to provide you with exceptional heart care, we have created designated Provider Care Teams. These Care Teams include your primary Cardiologist (physician) and Advanced Practice Providers (APPs- Physician Assistants and Nurse Practitioners) who all work together to provide you with the care you need, when you need it.   You may see any of the following providers on your designated Care Team at your next follow up: Dr Arvilla Meres Dr Marca Ancona Dr. Dorthula Nettles Dr. Clearnce Hasten Amy Filbert Schilder, NP Robbie Lis, [place] [facility] [place], [place] Brynda Peon, NP [place] Lee, NP Clarisa Kindred, NP Karle Plumber, PharmD Enos Fling, PharmD   Please be sure to bring in all  your medications bottles to every appointment.    Thank you for choosing  [facility]

## 2023-11-13 NOTE — Progress Notes (Signed)
 ADVANCED HEART FAILURE NEW PATIENT CLINIC NOTE  Referring Physician: Delma Officer, PA  Primary Care: Delma Officer, [place] Primary Cardiologist:  HPI: [name] is a 53 y.o. female with a PMH of  type 1 diabetes, hypertension, HFrEF, cardiac arrest status post ICD, gastroparesis, familial hyperlipidemia, and ESRD on HD MWF  who presents for initial visit for further evaluation and treatment of heart failure/cardiomyopathy.      Patient has a longstanding cardiac history.  She has had issues in the past with hypertension and hypertensive urgency.  Hospitalized in 12/09/21 with sudden cardiac death and now has an ICD implantation.  She was standing in line at a restaurant when she had a syncopal event.  AED was applied and suggested shock.  ICD was implanted at that time.  Patient has had multiple admissions around 09-Dec-2021 and 2022-12-10, though this appears to have significantly improved.  She has a significant amount of GI distress including dumping syndrome, nausea and vomiting from likely gastroparesis.     SUBJECTIVE:  Patient reports that overall she is feeling poorly.  She is short of breath with mild to moderate exertion, and has frequent episodes of nausea and vomiting during dialysis.  She has significant blood pressure fluctuations and when she is vomiting finds it difficult to take certain medications.  She does still urinate 4-5 times a day with decent volume.  She recently had to undergo an AV fistula procedure which required contrast and so had to be on steroids, she notes that this is caused her to retain fluid.  Longstanding history of hypertension as well as diabetes, which is better controlled on an insulin pump.  She reports a strong family history of cardiac disease including heart failure and multiple first-generation family members.  She does report dizziness upon standing, neuropathy pain, gastroparesis.   PMH, current medications, allergies, social history, and family  history reviewed in epic.  PHYSICAL EXAM: Vitals:   11/13/23 1047  BP: 110/80  Pulse: 95  SpO2: 98%   GENERAL: Well nourished and in no apparent distress at rest.  PULM:  Normal work of breathing, clear to auscultation bilaterally. Respirations are unlabored.  CARDIAC:  JVP: Mildly elevated         Normal rate with regular rhythm. No murmurs, rubs or gallops.  No lower extremity edema. Warm and well perfused extremities. ABDOMEN: Soft, non-tender, mildly distended NEUROLOGIC: Patient is oriented x3 with no focal or lateralizing neurologic deficits.    DATA REVIEW  ECG: 11/13/23: NSR, normal QRS, mildly prolonged QT    ECHO: 06/2023: LVEF 35-40%, grade II DD, normal RV systolic function, severely enlarged LA, trivial MR  CATH: None  CCTA: 2019/12/10, CAC score of 0, normal coronary arteries    Heart failure review: - Classification: Heart failure with reduced EF - Etiology: Cardiometabolic - NYHA Class: III - Volume status: Hypervolemic - ACEi/ARB/ARNI: Currently up-titrating - Aldosterone antagonist: Intolerant - Beta-blocker: Currently up-titrating - Digoxin: Intolerant - Hydralazine/Nitrates: Not indicated - SGLT2i: Not indicated - GLP-1: Not a candidate - Advanced therapies: Not needed at this time - ICD: Already in place   ASSESSMENT & PLAN:  Chronic systolic heart failure: Patient with NYHA Class III symptoms, mildly overloaded. We discussed that her HF is likely not severe enough based on her echocardiogram for consideration of Htxp. She also has not been trialed on aggressive medical therapy and there is some hope for systolic function improvement. She does have residual urine output. - Stop hydralzine/isodril - Start entresto  24/26mg  BID, hold on HD mornings - Start toresmide 40mg  on non-HD days - Continue carevdilol 3.125mg  BID - Symptoms and LVH likely explained by long standing DM and HTN, but given concerning features for amyloid will send AL and genetic  testing - If positive can consider further workup - Pharmacy visit in 2-3 weeks for titration - If no improvement can consider RHC versus CPEX for risk stratification  ESRD: 2/2 longstanding T1DM, on iHD. 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 - Continue reglan, Qtc borderline, ICD in place  Follow up in 2 months  Clearnce Hasten, MD Advanced Heart Failure Mechanical Circulatory Support 11/13/23

## 2023-11-14 DIAGNOSIS — N186 End stage renal disease: Secondary | ICD-10-CM | POA: Diagnosis not present

## 2023-11-14 DIAGNOSIS — T8249XA Other complication of vascular dialysis catheter, initial encounter: Secondary | ICD-10-CM | POA: Diagnosis not present

## 2023-11-14 DIAGNOSIS — N2581 Secondary hyperparathyroidism of renal origin: Secondary | ICD-10-CM | POA: Diagnosis not present

## 2023-11-14 DIAGNOSIS — Z992 Dependence on renal dialysis: Secondary | ICD-10-CM | POA: Diagnosis not present

## 2023-11-14 LAB — KAPPA/LAMBDA LIGHT CHAINS
Kappa free light chain: 110.3 mg/L — ABNORMAL HIGH (ref 3.3–19.4)
Kappa, lambda light chain ratio: 1.54 (ref 0.26–1.65)
Lambda free light chains: 71.5 mg/L — ABNORMAL HIGH (ref 5.7–26.3)

## 2023-11-14 NOTE — Addendum Note (Signed)
 Addended by: Elease Etienne A on: 11/14/2023 02:02 PM   Modules accepted: Orders

## 2023-11-14 NOTE — Progress Notes (Signed)
 Remote ICD transmission.

## 2023-11-16 LAB — MULTIPLE MYELOMA PANEL, SERUM
Albumin SerPl Elph-Mcnc: 3.7 g/dL (ref 2.9–4.4)
Albumin/Glob SerPl: 1.1 (ref 0.7–1.7)
Alpha 1: 0.3 g/dL (ref 0.0–0.4)
Alpha2 Glob SerPl Elph-Mcnc: 0.9 g/dL (ref 0.4–1.0)
B-Globulin SerPl Elph-Mcnc: 1.2 g/dL (ref 0.7–1.3)
Gamma Glob SerPl Elph-Mcnc: 1.1 g/dL (ref 0.4–1.8)
Globulin, Total: 3.5 g/dL (ref 2.2–3.9)
IgA: 312 mg/dL (ref 87–352)
IgG (Immunoglobin G), Serum: 1113 mg/dL (ref 586–1602)
IgM (Immunoglobulin M), Srm: 130 mg/dL (ref 26–217)
Total Protein ELP: 7.2 g/dL (ref 6.0–8.5)

## 2023-11-17 DIAGNOSIS — N186 End stage renal disease: Secondary | ICD-10-CM | POA: Diagnosis not present

## 2023-11-17 DIAGNOSIS — Z992 Dependence on renal dialysis: Secondary | ICD-10-CM | POA: Diagnosis not present

## 2023-11-17 DIAGNOSIS — N2581 Secondary hyperparathyroidism of renal origin: Secondary | ICD-10-CM | POA: Diagnosis not present

## 2023-11-17 DIAGNOSIS — T8249XA Other complication of vascular dialysis catheter, initial encounter: Secondary | ICD-10-CM | POA: Diagnosis not present

## 2023-11-19 DIAGNOSIS — T8249XA Other complication of vascular dialysis catheter, initial encounter: Secondary | ICD-10-CM | POA: Diagnosis not present

## 2023-11-19 DIAGNOSIS — N186 End stage renal disease: Secondary | ICD-10-CM | POA: Diagnosis not present

## 2023-11-19 DIAGNOSIS — Z992 Dependence on renal dialysis: Secondary | ICD-10-CM | POA: Diagnosis not present

## 2023-11-19 DIAGNOSIS — N2581 Secondary hyperparathyroidism of renal origin: Secondary | ICD-10-CM | POA: Diagnosis not present

## 2023-11-21 DIAGNOSIS — N186 End stage renal disease: Secondary | ICD-10-CM | POA: Diagnosis not present

## 2023-11-21 DIAGNOSIS — T8249XA Other complication of vascular dialysis catheter, initial encounter: Secondary | ICD-10-CM | POA: Diagnosis not present

## 2023-11-21 DIAGNOSIS — N2581 Secondary hyperparathyroidism of renal origin: Secondary | ICD-10-CM | POA: Diagnosis not present

## 2023-11-21 DIAGNOSIS — Z992 Dependence on renal dialysis: Secondary | ICD-10-CM | POA: Diagnosis not present

## 2023-11-24 DIAGNOSIS — T8249XA Other complication of vascular dialysis catheter, initial encounter: Secondary | ICD-10-CM | POA: Diagnosis not present

## 2023-11-24 DIAGNOSIS — Z992 Dependence on renal dialysis: Secondary | ICD-10-CM | POA: Diagnosis not present

## 2023-11-24 DIAGNOSIS — N186 End stage renal disease: Secondary | ICD-10-CM | POA: Diagnosis not present

## 2023-11-24 DIAGNOSIS — N2581 Secondary hyperparathyroidism of renal origin: Secondary | ICD-10-CM | POA: Diagnosis not present

## 2023-11-25 ENCOUNTER — Encounter (HOSPITAL_COMMUNITY): Payer: Self-pay | Admitting: Cardiology

## 2023-11-26 DIAGNOSIS — N186 End stage renal disease: Secondary | ICD-10-CM | POA: Diagnosis not present

## 2023-11-26 DIAGNOSIS — Z992 Dependence on renal dialysis: Secondary | ICD-10-CM | POA: Diagnosis not present

## 2023-11-26 DIAGNOSIS — N2581 Secondary hyperparathyroidism of renal origin: Secondary | ICD-10-CM | POA: Diagnosis not present

## 2023-11-26 DIAGNOSIS — T8249XA Other complication of vascular dialysis catheter, initial encounter: Secondary | ICD-10-CM | POA: Diagnosis not present

## 2023-11-28 DIAGNOSIS — T8249XA Other complication of vascular dialysis catheter, initial encounter: Secondary | ICD-10-CM | POA: Diagnosis not present

## 2023-11-28 DIAGNOSIS — N186 End stage renal disease: Secondary | ICD-10-CM | POA: Diagnosis not present

## 2023-11-28 DIAGNOSIS — N2581 Secondary hyperparathyroidism of renal origin: Secondary | ICD-10-CM | POA: Diagnosis not present

## 2023-11-28 DIAGNOSIS — Z992 Dependence on renal dialysis: Secondary | ICD-10-CM | POA: Diagnosis not present

## 2023-12-01 DIAGNOSIS — N2581 Secondary hyperparathyroidism of renal origin: Secondary | ICD-10-CM | POA: Diagnosis not present

## 2023-12-01 DIAGNOSIS — T8249XA Other complication of vascular dialysis catheter, initial encounter: Secondary | ICD-10-CM | POA: Diagnosis not present

## 2023-12-01 DIAGNOSIS — N186 End stage renal disease: Secondary | ICD-10-CM | POA: Diagnosis not present

## 2023-12-01 DIAGNOSIS — Z992 Dependence on renal dialysis: Secondary | ICD-10-CM | POA: Diagnosis not present

## 2023-12-02 DIAGNOSIS — Z992 Dependence on renal dialysis: Secondary | ICD-10-CM | POA: Diagnosis not present

## 2023-12-02 DIAGNOSIS — N186 End stage renal disease: Secondary | ICD-10-CM | POA: Diagnosis not present

## 2023-12-03 DIAGNOSIS — Z992 Dependence on renal dialysis: Secondary | ICD-10-CM | POA: Diagnosis not present

## 2023-12-03 DIAGNOSIS — N2581 Secondary hyperparathyroidism of renal origin: Secondary | ICD-10-CM | POA: Diagnosis not present

## 2023-12-03 DIAGNOSIS — N186 End stage renal disease: Secondary | ICD-10-CM | POA: Diagnosis not present

## 2023-12-03 DIAGNOSIS — T8249XA Other complication of vascular dialysis catheter, initial encounter: Secondary | ICD-10-CM | POA: Diagnosis not present

## 2023-12-03 DIAGNOSIS — I129 Hypertensive chronic kidney disease with stage 1 through stage 4 chronic kidney disease, or unspecified chronic kidney disease: Secondary | ICD-10-CM | POA: Diagnosis not present

## 2023-12-04 ENCOUNTER — Ambulatory Visit (HOSPITAL_COMMUNITY)
Admission: RE | Admit: 2023-12-04 | Discharge: 2023-12-04 | Disposition: A | Discharge status: Home / Self Care | Source: Ambulatory Visit | Attending: Cardiology | Admitting: Cardiology

## 2023-12-04 VITALS — BP 122/74 | HR 102 | Wt 175.6 lb

## 2023-12-04 DIAGNOSIS — Z95810 Presence of automatic (implantable) cardiac defibrillator: Secondary | ICD-10-CM | POA: Insufficient documentation

## 2023-12-04 DIAGNOSIS — Z992 Dependence on renal dialysis: Secondary | ICD-10-CM | POA: Diagnosis not present

## 2023-12-04 DIAGNOSIS — I5042 Chronic combined systolic (congestive) and diastolic (congestive) heart failure: Secondary | ICD-10-CM

## 2023-12-04 DIAGNOSIS — Z9641 Presence of insulin pump (external) (internal): Secondary | ICD-10-CM | POA: Diagnosis not present

## 2023-12-04 DIAGNOSIS — I5022 Chronic systolic (congestive) heart failure: Secondary | ICD-10-CM | POA: Diagnosis not present

## 2023-12-04 DIAGNOSIS — K911 Postgastric surgery syndromes: Secondary | ICD-10-CM | POA: Diagnosis not present

## 2023-12-04 DIAGNOSIS — Z79899 Other long term (current) drug therapy: Secondary | ICD-10-CM | POA: Insufficient documentation

## 2023-12-04 DIAGNOSIS — E1022 Type 1 diabetes mellitus with diabetic chronic kidney disease: Secondary | ICD-10-CM | POA: Insufficient documentation

## 2023-12-04 DIAGNOSIS — N186 End stage renal disease: Secondary | ICD-10-CM | POA: Diagnosis not present

## 2023-12-04 DIAGNOSIS — I132 Hypertensive heart and chronic kidney disease with heart failure and with stage 5 chronic kidney disease, or end stage renal disease: Secondary | ICD-10-CM | POA: Diagnosis not present

## 2023-12-04 DIAGNOSIS — E7849 Other hyperlipidemia: Secondary | ICD-10-CM | POA: Insufficient documentation

## 2023-12-04 DIAGNOSIS — Z8679 Personal history of other diseases of the circulatory system: Secondary | ICD-10-CM | POA: Insufficient documentation

## 2023-12-04 MED ORDER — CARVEDILOL 6.25 MG PO TABS: 6.2500 mg | ORAL_TABLET | Freq: Two times a day (BID) | ORAL | 3 refills | Status: AC

## 2023-12-04 NOTE — Patient Instructions (Addendum)
 It was a pleasure seeing you today!  MEDICATIONS: -We are changing your medications today -Increase carvedilol  to 6.25 mg twice daily; you may take 2 tablets of your current carvedilol  3.125 mg twice daily until you pick up your new prescription -Call if you have questions about your medications.  NEXT APPOINTMENT: Return to clinic in 2 months with Dr. Alease Amend.  In general, to take care of your heart failure: -Limit your fluid intake to 2 Liters (half-gallon) per day.   -Limit your salt intake to ideally 2-3 grams (2000-3000 mg) per day. -Weigh yourself daily and record, and bring that "weight diary" to your next appointment.  (Weight gain of 2-3 pounds in 1 day typically means fluid weight.) -The medications for your heart are to help your heart and help you live longer.   -Please contact us  before stopping any of your heart medications.  Call the clinic at [phone] with questions or to reschedule future appointments.

## 2023-12-04 NOTE — Progress Notes (Signed)
 Advanced Heart Failure Clinic Note   Referring Physician: Karalee Oscar, PA  Primary Care: Karalee Oscar, [place] HF Cardiologist: Dr. Alease Amend   HPI: [name] is a 53 y.o. female with a PMH of  type 1 diabetes mellitus, hypertension, HFrEF, cardiac arrest status post ICD, gastroparesis, familial hyperlipidemia, and ESRD on HD. Hospitalized in 2021-12-17 with sudden cardiac death and now has an ICD implantation. Patient has had multiple admissions around Dec 17, 2021 and 12-18-22, though this appears to have significantly improved. She has a history of significant amount of GI distress including dumping syndrome, nausea and vomiting from likely gastroparesis. Longstanding history of hypertension as well as diabetes, which is better controlled on an insulin  pump.  She reports a strong family history of cardiac disease including heart failure and multiple first-generation family members.  ECHO 06/2023: LVEF 35-40%, grade II DD, normal RV systolic function, severely enlarged LA, trivial MR.   She presented to clinic on 11/13/23 for initial visit for further evaluation and treatment of heart failure/cardiomyopathy. She reported overall feeling poorly. Reported being short of breath with mild to moderate exertion, and having frequent episodes of nausea and vomiting during dialysis. She reported having significant blood pressure fluctuations and finds it difficult to take certain medications. She reported urinating 4-5 times a day with decent volume. Reported dizziness upon standing, neuropathy pain, gastroparesis.   Today she returns to HF clinic for pharmacist medication titration. At last visit with MD hydralazine /isosorbide  dinitrate was stopped, started on Entresto  24/26 mg BID (held on HD mornings), and started on torsemide  40 mg daily on non-HD days. She reports feeling well today. She says she has no changes in her breathing status but that she is able to complete ADLs with minimal limitations. She does  report brief episodes of dizziness at rest that resolve quickly within seconds. She has no visible swelling and she denies any increased swelling of her lower extremities at home. She does report some PND at home finding herself gasping for air while sleeping occasionally (2x per week). She states her urine output has continued to improve with increased volumes and frequency of 2-5x/day on most days. She reports her BP control has improved. The highest her BP gets is pre-HD is up to 170/110 mmHg and the lowest it gets is following HD at around 110/70 mmHg. After taking her medications this morning her BP in clinic was 122/74 mmHg (non-iHD day). She uses a wrist cuff at home that had falsely elevated readings of ~150/95 mmHg on both arms. Patient was agreeable to start using an arm cuff to monitor BP at home. She was taking her carvedilol  and Entresto  only on non-HD days and using her torsemide  as needed to increase urination. Of note, when she has episodes of vomiting and gastroparesis she will hold all her medications.   HF Medications: Carvedilol  3.125 mg BID on non-HD days Entresto  24/26 mg BID on non-HD days Torsemide  20 mg daily on non-HD days  Has the patient been experiencing any side effects to the medications prescribed?  no  Does the patient have any problems obtaining medications due to transportation or finances?   No; [employer] Commercial  Understanding of regimen: good Understanding of indications: good Potential of compliance: fair Patient understands to avoid NSAIDs. Patient understands to avoid decongestants.    Pertinent Lab Values: 09/18/23: Serum creatinine 6.90 mg/dL, BUN 42 mg/dL, Potassium 4.1 mmol/L, Sodium 134 mmol/L  10/09/22: BNP 1719 pg/mL  Vital Signs: Weight: 175.2 lbs (last clinic  weight: 176 lbs) Blood pressure: 122/74 mmHg (last BP 110/80 mmHg) Heart rate: 102 bpm (last HR 95 bpm)  ASSESSMENT & PLAN: Chronic systolic heart failure:  HF is likely not severe enough  based on her echocardiogram for consideration of Htxp. She also has not been trialed on aggressive medical therapy and there is some hope for systolic function improvement. She does have residual urine output. Patient with NYHA Class II symptoms, euvolemic on exam. - Continue torsemide  40 mg on non-HD days - Increase carvedilol  to 6.25 mg BID - Continue Entresto  24/26 mg BID, hold on HD mornings  ESRD: 2/2 longstanding T1DM, on iHD. 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 - Continue reglan , Qtc borderline, ICD in place   Follow up in 2 months with Dr. Althea Atkinson, PharmD PGY2 Cardiology Pharmacy Resident

## 2023-12-05 DIAGNOSIS — N186 End stage renal disease: Secondary | ICD-10-CM | POA: Diagnosis not present

## 2023-12-05 DIAGNOSIS — Z992 Dependence on renal dialysis: Secondary | ICD-10-CM | POA: Diagnosis not present

## 2023-12-05 DIAGNOSIS — T8249XA Other complication of vascular dialysis catheter, initial encounter: Secondary | ICD-10-CM | POA: Diagnosis not present

## 2023-12-05 DIAGNOSIS — N2581 Secondary hyperparathyroidism of renal origin: Secondary | ICD-10-CM | POA: Diagnosis not present

## 2023-12-08 DIAGNOSIS — N186 End stage renal disease: Secondary | ICD-10-CM | POA: Diagnosis not present

## 2023-12-08 DIAGNOSIS — T8249XA Other complication of vascular dialysis catheter, initial encounter: Secondary | ICD-10-CM | POA: Diagnosis not present

## 2023-12-08 DIAGNOSIS — Z992 Dependence on renal dialysis: Secondary | ICD-10-CM | POA: Diagnosis not present

## 2023-12-08 DIAGNOSIS — N2581 Secondary hyperparathyroidism of renal origin: Secondary | ICD-10-CM | POA: Diagnosis not present

## 2023-12-09 DIAGNOSIS — E113513 Type 2 diabetes mellitus with proliferative diabetic retinopathy with macular edema, bilateral: Secondary | ICD-10-CM | POA: Diagnosis not present

## 2023-12-09 DIAGNOSIS — H43823 Vitreomacular adhesion, bilateral: Secondary | ICD-10-CM | POA: Diagnosis not present

## 2023-12-09 DIAGNOSIS — H3582 Retinal ischemia: Secondary | ICD-10-CM | POA: Diagnosis not present

## 2023-12-09 DIAGNOSIS — H2513 Age-related nuclear cataract, bilateral: Secondary | ICD-10-CM | POA: Diagnosis not present

## 2023-12-09 DIAGNOSIS — H35033 Hypertensive retinopathy, bilateral: Secondary | ICD-10-CM | POA: Diagnosis not present

## 2023-12-10 DIAGNOSIS — Z992 Dependence on renal dialysis: Secondary | ICD-10-CM | POA: Diagnosis not present

## 2023-12-10 DIAGNOSIS — N186 End stage renal disease: Secondary | ICD-10-CM | POA: Diagnosis not present

## 2023-12-10 DIAGNOSIS — N2581 Secondary hyperparathyroidism of renal origin: Secondary | ICD-10-CM | POA: Diagnosis not present

## 2023-12-10 DIAGNOSIS — T8249XA Other complication of vascular dialysis catheter, initial encounter: Secondary | ICD-10-CM | POA: Diagnosis not present

## 2023-12-12 DIAGNOSIS — Z992 Dependence on renal dialysis: Secondary | ICD-10-CM | POA: Diagnosis not present

## 2023-12-12 DIAGNOSIS — N2581 Secondary hyperparathyroidism of renal origin: Secondary | ICD-10-CM | POA: Diagnosis not present

## 2023-12-12 DIAGNOSIS — T8249XA Other complication of vascular dialysis catheter, initial encounter: Secondary | ICD-10-CM | POA: Diagnosis not present

## 2023-12-12 DIAGNOSIS — N186 End stage renal disease: Secondary | ICD-10-CM | POA: Diagnosis not present

## 2023-12-15 DIAGNOSIS — N2581 Secondary hyperparathyroidism of renal origin: Secondary | ICD-10-CM | POA: Diagnosis not present

## 2023-12-15 DIAGNOSIS — N186 End stage renal disease: Secondary | ICD-10-CM | POA: Diagnosis not present

## 2023-12-15 DIAGNOSIS — Z992 Dependence on renal dialysis: Secondary | ICD-10-CM | POA: Diagnosis not present

## 2023-12-17 DIAGNOSIS — N186 End stage renal disease: Secondary | ICD-10-CM | POA: Diagnosis not present

## 2023-12-17 DIAGNOSIS — N2581 Secondary hyperparathyroidism of renal origin: Secondary | ICD-10-CM | POA: Diagnosis not present

## 2023-12-17 DIAGNOSIS — Z992 Dependence on renal dialysis: Secondary | ICD-10-CM | POA: Diagnosis not present

## 2023-12-18 ENCOUNTER — Encounter (HOSPITAL_BASED_OUTPATIENT_CLINIC_OR_DEPARTMENT_OTHER): Admitting: Family

## 2023-12-19 DIAGNOSIS — Z992 Dependence on renal dialysis: Secondary | ICD-10-CM | POA: Diagnosis not present

## 2023-12-19 DIAGNOSIS — N186 End stage renal disease: Secondary | ICD-10-CM | POA: Diagnosis not present

## 2023-12-19 DIAGNOSIS — N2581 Secondary hyperparathyroidism of renal origin: Secondary | ICD-10-CM | POA: Diagnosis not present

## 2023-12-22 DIAGNOSIS — N2581 Secondary hyperparathyroidism of renal origin: Secondary | ICD-10-CM | POA: Diagnosis not present

## 2023-12-22 DIAGNOSIS — N186 End stage renal disease: Secondary | ICD-10-CM | POA: Diagnosis not present

## 2023-12-22 DIAGNOSIS — Z992 Dependence on renal dialysis: Secondary | ICD-10-CM | POA: Diagnosis not present

## 2023-12-23 ENCOUNTER — Telehealth (HOSPITAL_COMMUNITY): Payer: Self-pay

## 2023-12-23 NOTE — Telephone Encounter (Signed)
 Received a fax requesting medical records from [facility] Records were successfully faxed to: [phone] ,which was the number provided.. Medical request form will be scanned into patients chart.

## 2023-12-24 DIAGNOSIS — N2581 Secondary hyperparathyroidism of renal origin: Secondary | ICD-10-CM | POA: Diagnosis not present

## 2023-12-24 DIAGNOSIS — N186 End stage renal disease: Secondary | ICD-10-CM | POA: Diagnosis not present

## 2023-12-24 DIAGNOSIS — Z992 Dependence on renal dialysis: Secondary | ICD-10-CM | POA: Diagnosis not present

## 2023-12-26 DIAGNOSIS — Z992 Dependence on renal dialysis: Secondary | ICD-10-CM | POA: Diagnosis not present

## 2023-12-26 DIAGNOSIS — N2581 Secondary hyperparathyroidism of renal origin: Secondary | ICD-10-CM | POA: Diagnosis not present

## 2023-12-26 DIAGNOSIS — N186 End stage renal disease: Secondary | ICD-10-CM | POA: Diagnosis not present

## 2023-12-29 DIAGNOSIS — Z992 Dependence on renal dialysis: Secondary | ICD-10-CM | POA: Diagnosis not present

## 2023-12-29 DIAGNOSIS — N186 End stage renal disease: Secondary | ICD-10-CM | POA: Diagnosis not present

## 2023-12-29 DIAGNOSIS — N2581 Secondary hyperparathyroidism of renal origin: Secondary | ICD-10-CM | POA: Diagnosis not present

## 2023-12-31 DIAGNOSIS — Z992 Dependence on renal dialysis: Secondary | ICD-10-CM | POA: Diagnosis not present

## 2023-12-31 DIAGNOSIS — N2581 Secondary hyperparathyroidism of renal origin: Secondary | ICD-10-CM | POA: Diagnosis not present

## 2023-12-31 DIAGNOSIS — N186 End stage renal disease: Secondary | ICD-10-CM | POA: Diagnosis not present

## 2024-01-01 DIAGNOSIS — E785 Hyperlipidemia, unspecified: Secondary | ICD-10-CM | POA: Diagnosis not present

## 2024-01-01 DIAGNOSIS — E1140 Type 2 diabetes mellitus with diabetic neuropathy, unspecified: Secondary | ICD-10-CM | POA: Diagnosis not present

## 2024-01-01 DIAGNOSIS — Z23 Encounter for immunization: Secondary | ICD-10-CM | POA: Diagnosis not present

## 2024-01-01 DIAGNOSIS — Z0000 Encounter for general adult medical examination without abnormal findings: Secondary | ICD-10-CM | POA: Diagnosis not present

## 2024-01-01 DIAGNOSIS — I10 Essential (primary) hypertension: Secondary | ICD-10-CM | POA: Diagnosis not present

## 2024-01-02 DIAGNOSIS — N186 End stage renal disease: Secondary | ICD-10-CM | POA: Diagnosis not present

## 2024-01-02 DIAGNOSIS — Z992 Dependence on renal dialysis: Secondary | ICD-10-CM | POA: Diagnosis not present

## 2024-01-02 DIAGNOSIS — N2581 Secondary hyperparathyroidism of renal origin: Secondary | ICD-10-CM | POA: Diagnosis not present

## 2024-01-03 DIAGNOSIS — Z992 Dependence on renal dialysis: Secondary | ICD-10-CM | POA: Diagnosis not present

## 2024-01-03 DIAGNOSIS — I129 Hypertensive chronic kidney disease with stage 1 through stage 4 chronic kidney disease, or unspecified chronic kidney disease: Secondary | ICD-10-CM | POA: Diagnosis not present

## 2024-01-03 DIAGNOSIS — N186 End stage renal disease: Secondary | ICD-10-CM | POA: Diagnosis not present

## 2024-01-05 DIAGNOSIS — Z992 Dependence on renal dialysis: Secondary | ICD-10-CM | POA: Diagnosis not present

## 2024-01-05 DIAGNOSIS — N2581 Secondary hyperparathyroidism of renal origin: Secondary | ICD-10-CM | POA: Diagnosis not present

## 2024-01-05 DIAGNOSIS — N186 End stage renal disease: Secondary | ICD-10-CM | POA: Diagnosis not present

## 2024-01-06 DIAGNOSIS — E113512 Type 2 diabetes mellitus with proliferative diabetic retinopathy with macular edema, left eye: Secondary | ICD-10-CM | POA: Diagnosis not present

## 2024-01-07 DIAGNOSIS — N186 End stage renal disease: Secondary | ICD-10-CM | POA: Diagnosis not present

## 2024-01-07 DIAGNOSIS — Z992 Dependence on renal dialysis: Secondary | ICD-10-CM | POA: Diagnosis not present

## 2024-01-07 DIAGNOSIS — N2581 Secondary hyperparathyroidism of renal origin: Secondary | ICD-10-CM | POA: Diagnosis not present

## 2024-01-08 ENCOUNTER — Ambulatory Visit (INDEPENDENT_AMBULATORY_CARE_PROVIDER_SITE_OTHER)

## 2024-01-08 DIAGNOSIS — I428 Other cardiomyopathies: Secondary | ICD-10-CM

## 2024-01-09 DIAGNOSIS — N2581 Secondary hyperparathyroidism of renal origin: Secondary | ICD-10-CM | POA: Diagnosis not present

## 2024-01-09 DIAGNOSIS — Z992 Dependence on renal dialysis: Secondary | ICD-10-CM | POA: Diagnosis not present

## 2024-01-09 DIAGNOSIS — N186 End stage renal disease: Secondary | ICD-10-CM | POA: Diagnosis not present

## 2024-01-12 DIAGNOSIS — N186 End stage renal disease: Secondary | ICD-10-CM | POA: Diagnosis not present

## 2024-01-12 DIAGNOSIS — Z992 Dependence on renal dialysis: Secondary | ICD-10-CM | POA: Diagnosis not present

## 2024-01-12 DIAGNOSIS — N2581 Secondary hyperparathyroidism of renal origin: Secondary | ICD-10-CM | POA: Diagnosis not present

## 2024-01-12 LAB — CUP PACEART REMOTE DEVICE CHECK
Battery Remaining Longevity: 121 mo
Battery Voltage: 2.99 V
Brady Statistic RV Percent Paced: 0 %
Date Time Interrogation Session: 20250606003628
HighPow Impedance: 60 Ohm
Implantable Lead Connection Status: 753985
Implantable Lead Implant Date: 20230905
Implantable Lead Location: 753860
Implantable Pulse Generator Implant Date: 20230905
Lead Channel Impedance Value: 285 Ohm
Lead Channel Impedance Value: 342 Ohm
Lead Channel Pacing Threshold Amplitude: 0.875 V
Lead Channel Pacing Threshold Pulse Width: 0.4 ms
Lead Channel Sensing Intrinsic Amplitude: 5.75 mV
Lead Channel Sensing Intrinsic Amplitude: 5.75 mV
Lead Channel Setting Pacing Amplitude: 2 V
Lead Channel Setting Pacing Pulse Width: 0.4 ms
Lead Channel Setting Sensing Sensitivity: 0.3 mV
Zone Setting Status: 755011
Zone Setting Status: 755011

## 2024-01-13 ENCOUNTER — Ambulatory Visit: Payer: Self-pay | Admitting: Cardiovascular Disease

## 2024-01-14 DIAGNOSIS — N186 End stage renal disease: Secondary | ICD-10-CM | POA: Diagnosis not present

## 2024-01-14 DIAGNOSIS — N2581 Secondary hyperparathyroidism of renal origin: Secondary | ICD-10-CM | POA: Diagnosis not present

## 2024-01-14 DIAGNOSIS — Z992 Dependence on renal dialysis: Secondary | ICD-10-CM | POA: Diagnosis not present

## 2024-01-16 ENCOUNTER — Other Ambulatory Visit (HOSPITAL_COMMUNITY): Payer: Self-pay | Admitting: Cardiology

## 2024-01-16 ENCOUNTER — Telehealth (HOSPITAL_COMMUNITY): Payer: Self-pay

## 2024-01-16 DIAGNOSIS — N2581 Secondary hyperparathyroidism of renal origin: Secondary | ICD-10-CM | POA: Diagnosis not present

## 2024-01-16 DIAGNOSIS — N186 End stage renal disease: Secondary | ICD-10-CM | POA: Diagnosis not present

## 2024-01-16 DIAGNOSIS — Z992 Dependence on renal dialysis: Secondary | ICD-10-CM | POA: Diagnosis not present

## 2024-01-16 NOTE — Telephone Encounter (Signed)
 Forms faxed to [facility]. My chart message sent to patient

## 2024-01-19 DIAGNOSIS — N2581 Secondary hyperparathyroidism of renal origin: Secondary | ICD-10-CM | POA: Diagnosis not present

## 2024-01-19 DIAGNOSIS — N186 End stage renal disease: Secondary | ICD-10-CM | POA: Diagnosis not present

## 2024-01-19 DIAGNOSIS — Z992 Dependence on renal dialysis: Secondary | ICD-10-CM | POA: Diagnosis not present

## 2024-01-20 DIAGNOSIS — E1140 Type 2 diabetes mellitus with diabetic neuropathy, unspecified: Secondary | ICD-10-CM | POA: Diagnosis not present

## 2024-01-20 DIAGNOSIS — E049 Nontoxic goiter, unspecified: Secondary | ICD-10-CM | POA: Diagnosis not present

## 2024-01-20 DIAGNOSIS — E041 Nontoxic single thyroid nodule: Secondary | ICD-10-CM | POA: Diagnosis not present

## 2024-01-20 DIAGNOSIS — Z794 Long term (current) use of insulin: Secondary | ICD-10-CM | POA: Diagnosis not present

## 2024-01-21 DIAGNOSIS — Z992 Dependence on renal dialysis: Secondary | ICD-10-CM | POA: Diagnosis not present

## 2024-01-21 DIAGNOSIS — N2581 Secondary hyperparathyroidism of renal origin: Secondary | ICD-10-CM | POA: Diagnosis not present

## 2024-01-21 DIAGNOSIS — N186 End stage renal disease: Secondary | ICD-10-CM | POA: Diagnosis not present

## 2024-01-23 DIAGNOSIS — N2581 Secondary hyperparathyroidism of renal origin: Secondary | ICD-10-CM | POA: Diagnosis not present

## 2024-01-23 DIAGNOSIS — N186 End stage renal disease: Secondary | ICD-10-CM | POA: Diagnosis not present

## 2024-01-23 DIAGNOSIS — Z992 Dependence on renal dialysis: Secondary | ICD-10-CM | POA: Diagnosis not present

## 2024-01-26 DIAGNOSIS — Z992 Dependence on renal dialysis: Secondary | ICD-10-CM | POA: Diagnosis not present

## 2024-01-26 DIAGNOSIS — N2581 Secondary hyperparathyroidism of renal origin: Secondary | ICD-10-CM | POA: Diagnosis not present

## 2024-01-26 DIAGNOSIS — N186 End stage renal disease: Secondary | ICD-10-CM | POA: Diagnosis not present

## 2024-01-28 DIAGNOSIS — N186 End stage renal disease: Secondary | ICD-10-CM | POA: Diagnosis not present

## 2024-01-28 DIAGNOSIS — N2581 Secondary hyperparathyroidism of renal origin: Secondary | ICD-10-CM | POA: Diagnosis not present

## 2024-01-28 DIAGNOSIS — Z992 Dependence on renal dialysis: Secondary | ICD-10-CM | POA: Diagnosis not present

## 2024-01-30 DIAGNOSIS — N186 End stage renal disease: Secondary | ICD-10-CM | POA: Diagnosis not present

## 2024-01-30 DIAGNOSIS — N2581 Secondary hyperparathyroidism of renal origin: Secondary | ICD-10-CM | POA: Diagnosis not present

## 2024-01-30 DIAGNOSIS — Z992 Dependence on renal dialysis: Secondary | ICD-10-CM | POA: Diagnosis not present

## 2024-02-02 DIAGNOSIS — Z992 Dependence on renal dialysis: Secondary | ICD-10-CM | POA: Diagnosis not present

## 2024-02-02 DIAGNOSIS — N186 End stage renal disease: Secondary | ICD-10-CM | POA: Diagnosis not present

## 2024-02-02 DIAGNOSIS — I129 Hypertensive chronic kidney disease with stage 1 through stage 4 chronic kidney disease, or unspecified chronic kidney disease: Secondary | ICD-10-CM | POA: Diagnosis not present

## 2024-02-02 DIAGNOSIS — N2581 Secondary hyperparathyroidism of renal origin: Secondary | ICD-10-CM | POA: Diagnosis not present

## 2024-02-03 ENCOUNTER — Encounter: Payer: Self-pay | Admitting: Podiatry

## 2024-02-03 ENCOUNTER — Ambulatory Visit: Admitting: Podiatry

## 2024-02-03 ENCOUNTER — Other Ambulatory Visit: Payer: Self-pay

## 2024-02-03 DIAGNOSIS — L97512 Non-pressure chronic ulcer of other part of right foot with fat layer exposed: Secondary | ICD-10-CM

## 2024-02-03 DIAGNOSIS — M21622 Bunionette of left foot: Secondary | ICD-10-CM | POA: Diagnosis not present

## 2024-02-03 MED ORDER — MUPIROCIN 2 % EX OINT: 1.0000 | TOPICAL_OINTMENT | Freq: Two times a day (BID) | CUTANEOUS | 0 refills | Status: AC

## 2024-02-03 NOTE — Addendum Note (Signed)
 Addended by: GIB ANTES R on: 02/03/2024 11:55 AM   Modules accepted: Orders

## 2024-02-03 NOTE — Progress Notes (Signed)
 Patient presents today with a complaint of a blister that popped on the lateral last back and dorsal aspect of the foot.  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  No fever or chills or nausea or vomiting.  Has not had any ulcerations on the foot before.  Physical Exam:  Patient alert and oriented x 3.  No complaints of nausea, vomiting, fever, or chills  Vascular: DP pulses 1/4 bilateral. PT pulses 0/4 lateral.  Mild.-Moderate edema.LT  Capillary fill time Immediate b/l.SABRA  Dermatologic:  deep full-thickness ulceration penetrating subcutaneous tissue plantar fifth metatarsal head left   . Measures 14 mm wide x 14 mm long x 4 deep.  Serous drainage. No erythema.  Moderate exudate.  No undermining.  Base of granulation tissue.  Neurologic: Severely decreased vibratory sensation in feet bilaterally.  Reduced Achilles tendon reflex bilaterally.  Musculoskeletal: Tailor's bunion deformity left foot.  Hammertoes 2 through 5 bilaterally.  No tenderness with range of motion of the fifth metatarsal phalangeal joint and no crepitation noted.    Diagnoses: 1.  Tailor's bunion deformity left. 2.  Full-thickness ulceration penetrating into the subcutaneous tissue left foot without infection..  Plan: -New patient visit level 3 for evaluation and.  Modifier 25 - Discussed with her the underlying tailor's bunion deformity left resulting in the ulceration left foot.  Discussed the ulcer and etiology and treatment.  Discussed her neuropathy and the risk to the foot of ulceration and infection.  Explained to her that the ulceration developed on the plantar aspect of the foot and tract between the dermis and epidermis to the dorsal aspect of the foot -Sharply debrided the ulcer down to the subcutaneous tissue left foot.  Debrided down to good bleeding base.  Applied antibiotic ointment and a light dressing. -Wound care: Soak foot in warm Epsom salt water  twice daily for 15 minutes.  Apply Bactroban ointment and a light  dressing. - Dispensed surgical shoe left - Rx Bactroban ointment, wound foot twice daily.  Refill x 1    Return 1 week f/u ulcer and x-rays foot.

## 2024-02-04 DIAGNOSIS — N2581 Secondary hyperparathyroidism of renal origin: Secondary | ICD-10-CM | POA: Diagnosis not present

## 2024-02-04 DIAGNOSIS — Z992 Dependence on renal dialysis: Secondary | ICD-10-CM | POA: Diagnosis not present

## 2024-02-04 DIAGNOSIS — N186 End stage renal disease: Secondary | ICD-10-CM | POA: Diagnosis not present

## 2024-02-05 ENCOUNTER — Ambulatory Visit (HOSPITAL_COMMUNITY)
Admission: RE | Admit: 2024-02-05 | Discharge: 2024-02-05 | Disposition: A | Discharge status: Home / Self Care | Source: Ambulatory Visit | Attending: Cardiology | Admitting: Cardiology

## 2024-02-05 ENCOUNTER — Encounter (HOSPITAL_COMMUNITY): Payer: Self-pay | Admitting: Cardiology

## 2024-02-05 VITALS — BP 140/80 | HR 86 | Wt 184.8 lb

## 2024-02-05 DIAGNOSIS — I5022 Chronic systolic (congestive) heart failure: Secondary | ICD-10-CM | POA: Insufficient documentation

## 2024-02-05 DIAGNOSIS — N186 End stage renal disease: Secondary | ICD-10-CM | POA: Insufficient documentation

## 2024-02-05 DIAGNOSIS — E1022 Type 1 diabetes mellitus with diabetic chronic kidney disease: Secondary | ICD-10-CM | POA: Insufficient documentation

## 2024-02-05 DIAGNOSIS — Z992 Dependence on renal dialysis: Secondary | ICD-10-CM | POA: Diagnosis not present

## 2024-02-05 DIAGNOSIS — E1069 Type 1 diabetes mellitus with other specified complication: Secondary | ICD-10-CM | POA: Diagnosis not present

## 2024-02-05 DIAGNOSIS — I110 Hypertensive heart disease with heart failure: Secondary | ICD-10-CM | POA: Diagnosis not present

## 2024-02-05 DIAGNOSIS — K3189 Other diseases of stomach and duodenum: Secondary | ICD-10-CM | POA: Insufficient documentation

## 2024-02-05 DIAGNOSIS — I10 Essential (primary) hypertension: Secondary | ICD-10-CM | POA: Diagnosis not present

## 2024-02-05 DIAGNOSIS — E1065 Type 1 diabetes mellitus with hyperglycemia: Secondary | ICD-10-CM

## 2024-02-05 DIAGNOSIS — I5042 Chronic combined systolic (congestive) and diastolic (congestive) heart failure: Secondary | ICD-10-CM

## 2024-02-05 DIAGNOSIS — Z95810 Presence of automatic (implantable) cardiac defibrillator: Secondary | ICD-10-CM | POA: Diagnosis not present

## 2024-02-05 DIAGNOSIS — I132 Hypertensive heart and chronic kidney disease with heart failure and with stage 5 chronic kidney disease, or end stage renal disease: Secondary | ICD-10-CM | POA: Diagnosis not present

## 2024-02-05 DIAGNOSIS — E870 Hyperosmolality and hypernatremia: Secondary | ICD-10-CM

## 2024-02-05 MED ORDER — PANTOPRAZOLE SODIUM 40 MG PO TBEC: 40.0000 mg | DELAYED_RELEASE_TABLET | Freq: Two times a day (BID) | ORAL | 11 refills | Status: DC

## 2024-02-05 NOTE — Patient Instructions (Signed)
 There has been no changes to your medications.  Your physician has requested that you have an echocardiogram. Echocardiography is a painless test that uses sound waves to create images of your heart. It provides your doctor with information about the size and shape of your heart and how well your heart's chambers and valves are working. This procedure takes approximately one hour. There are no restrictions for this procedure. Please do NOT wear cologne, perfume, aftershave, or lotions (deodorant is allowed). Please arrive 15 minutes prior to your appointment time.  Please note: We ask at that you not bring children with you during ultrasound (echo/ vascular) testing. Due to room size and safety concerns, children are not allowed in the ultrasound rooms during exams. Our front office staff cannot provide observation of children in our lobby area while testing is being conducted. An adult accompanying a patient to their appointment will only be allowed in the ultrasound room at the discretion of the ultrasound technician under special circumstances. We apologize for any inconvenience.  Your physician recommends that you schedule a follow-up appointment in: 3 months with an echocardiogram. ( October) ** PLEASE CALL THE OFFICE IN [place] TO ARRANGE YOUR FOLLOW APPOINTMENT.**  If you have any questions or concerns before your next appointment please send us  a message through [place] or call our office at [phone].    TO LEAVE A MESSAGE FOR THE NURSE SELECT OPTION 2, PLEASE LEAVE A MESSAGE INCLUDING: YOUR NAME DATE OF BIRTH CALL BACK NUMBER REASON FOR CALL**this is important as we prioritize the call backs  YOU WILL RECEIVE A CALL BACK THE SAME DAY AS LONG AS YOU CALL BEFORE 4:00 PM  At the [facility], you and your health needs are our priority. As part of our continuing mission to provide you with exceptional heart care, we have created designated Provider Care Teams. These Care  Teams include your primary Cardiologist (physician) and Advanced Practice Providers (APPs- Physician Assistants and Nurse Practitioners) who all work together to provide you with the care you need, when you need it.   You may see any of the following providers on your designated Care Team at your next follow up: Dr Toribio Fuel Dr Ezra Shuck Dr. Ria Commander Dr. Morene Brownie Amy Lenetta, NP Caffie Shed, [place] [facility] [place], [place] Beckey Coe, NP [place] Lee, NP Ellouise Class, NP Tinnie Redman, PharmD Jaun Bash, PharmD   Please be sure to bring in all your medications bottles to every appointment.    Thank you for choosing [facility]

## 2024-02-05 NOTE — Progress Notes (Signed)
   ADVANCED HEART FAILURE FOLLOW UP CLINIC NOTE  Referring Physician: Alys Schuyler HERO, PA  Primary Care: Dwight Trula SQUIBB, MD Primary Cardiologist:  HPI: [name] is a 53 y.o. female who presents for follow up of chronic systolic heart failure.      Patient has a longstanding cardiac history.  She has had issues in the past with hypertension and hypertensive urgency.  Hospitalized in Mar 02, 2022 with sudden cardiac death and now has an ICD implantation.  She was standing in line at a restaurant when she had a syncopal event.  AED was applied and suggested shock.  ICD was implanted at that time.  Patient has had multiple admissions around 02-Mar-2022 and March 03, 2023, though this appears to have significantly improved.   She has a significant amount of GI distress including dumping syndrome, nausea and vomiting from likely gastroparesis.     SUBJECTIVE:  Does feel somewhat better than her last visit. Her BP has been much better controlled, though she has been taking both the entresto  and carvedilol  daily on non-HD days. We discussed the need for BID dosing, though would still hold on HD mornings. She also notes that she is occasionally taking the torsemide , though does urinate more when she takes it. Has a bit more energy.   PMH, current medications, allergies, social history, and family history reviewed in epic.  PHYSICAL EXAM: Vitals:   02/05/24 0916  BP: (!) 140/80  Pulse: 86  SpO2: 98%   GENERAL: Well nourished and in no apparent distress at rest.  PULM:  Normal work of breathing, clear to auscultation bilaterally. Respirations are unlabored.  CARDIAC:  JVP: flat         Normal rate with regular rhythm. No murmurs, rubs or gallops.  Trace edema. Warm and well perfused extremities. ABDOMEN: Soft, non-tender, non-distended. NEUROLOGIC: Patient is oriented x3 with no focal or lateralizing neurologic deficits.    DATA REVIEW  ECG: 11/13/23: NSR, normal QRS, mildly prolonged QT      ECHO: 06/2023: LVEF 35-40%, grade II DD, normal RV systolic function, severely enlarged LA, trivial MR   CATH: None   CCTA: Mar 02, 2020, CAC score of 0, normal coronary arteries  ASSESSMENT & PLAN:  Chronic systolic heart failure: Improved symptoms, NYHA Class II on GDMT. Tolerating well, though discussed dosing issues. Will plan on repeating echo at next visit to assess EF. - Continue entresto  24/26mg  BID, hold on HD mornings - Contiue toresmide 40mg  on non-HD days - Continue carvedilol  6.25mg  BID - Negative amyloid genetic testing - Echo at next visit - If no improvement can consider RHC versus CPEX for risk stratification   ESRD: 2/2 longstanding T1DM, on iHD. 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 - Continue reglan , Qtc borderline, ICD in place  Follow up in 3 months with echo  Morene Brownie, MD Advanced Heart Failure Mechanical Circulatory Support 02/05/24

## 2024-02-06 DIAGNOSIS — N186 End stage renal disease: Secondary | ICD-10-CM | POA: Diagnosis not present

## 2024-02-06 DIAGNOSIS — Z992 Dependence on renal dialysis: Secondary | ICD-10-CM | POA: Diagnosis not present

## 2024-02-06 DIAGNOSIS — N2581 Secondary hyperparathyroidism of renal origin: Secondary | ICD-10-CM | POA: Diagnosis not present

## 2024-02-09 DIAGNOSIS — N186 End stage renal disease: Secondary | ICD-10-CM | POA: Diagnosis not present

## 2024-02-09 DIAGNOSIS — Z992 Dependence on renal dialysis: Secondary | ICD-10-CM | POA: Diagnosis not present

## 2024-02-09 DIAGNOSIS — N2581 Secondary hyperparathyroidism of renal origin: Secondary | ICD-10-CM | POA: Diagnosis not present

## 2024-02-10 DIAGNOSIS — H43823 Vitreomacular adhesion, bilateral: Secondary | ICD-10-CM | POA: Diagnosis not present

## 2024-02-10 DIAGNOSIS — E113513 Type 2 diabetes mellitus with proliferative diabetic retinopathy with macular edema, bilateral: Secondary | ICD-10-CM | POA: Diagnosis not present

## 2024-02-10 DIAGNOSIS — H3582 Retinal ischemia: Secondary | ICD-10-CM | POA: Diagnosis not present

## 2024-02-10 DIAGNOSIS — H35033 Hypertensive retinopathy, bilateral: Secondary | ICD-10-CM | POA: Diagnosis not present

## 2024-02-10 DIAGNOSIS — H31092 Other chorioretinal scars, left eye: Secondary | ICD-10-CM | POA: Diagnosis not present

## 2024-02-11 DIAGNOSIS — N186 End stage renal disease: Secondary | ICD-10-CM | POA: Diagnosis not present

## 2024-02-11 DIAGNOSIS — Z992 Dependence on renal dialysis: Secondary | ICD-10-CM | POA: Diagnosis not present

## 2024-02-11 DIAGNOSIS — N2581 Secondary hyperparathyroidism of renal origin: Secondary | ICD-10-CM | POA: Diagnosis not present

## 2024-02-12 ENCOUNTER — Ambulatory Visit (INDEPENDENT_AMBULATORY_CARE_PROVIDER_SITE_OTHER)

## 2024-02-12 ENCOUNTER — Encounter: Payer: Self-pay | Admitting: Podiatry

## 2024-02-12 ENCOUNTER — Ambulatory Visit: Admitting: Podiatry

## 2024-02-12 DIAGNOSIS — L97522 Non-pressure chronic ulcer of other part of left foot with fat layer exposed: Secondary | ICD-10-CM

## 2024-02-12 DIAGNOSIS — M21622 Bunionette of left foot: Secondary | ICD-10-CM

## 2024-02-12 NOTE — Progress Notes (Signed)
 Patient presents follow-up ulcer left foot.  Has been doing the wound care as instructed and presents wearing her surgical shoe.  Says it has been looking better.  No F/C or N/V.  Physical Exam:  Patient alert and oriented x 3.  No complaints of nausea, vomiting, fever, or chills  Vascular: DP pulses 2/4 bilateral. PT pulses 2 to/4 lateral.  Mild edema foot left.  Edema is decreased from previous visit.. Capillary fill time immediate left.  Dermatologic: Full-thickness deep ulceration penetrating into subcutaneous tissue plantar foot at fifth metatarsal phalangeal joint left. Measures 10 mm wide x 10 mm long x 4 deep.  Moderate clear drainage.  No signs of infection.  Mild undermining.  Full-thickness deep ulceration dorsal lateral foot over the fifth metatarsal left. Measures 35 mm wide x 23 mm long x 3 deep.  Moderate clear drainage.  No signs of infection and no undermining  Large ulcer from previous visit now has a bridge across it forming to ulcers.  Neurologic:   Musculoskeletal:   Radiographs: Radiographs left foot 3 views: Tailor's bunion deformity.  Nones of any osteomyelitis.  Cortical margins are all intact with no radiolucency noted.  Normal bone density.  No evidence any bone tumors. no evidence of fractures.  Diagnoses: 1.  Full-thickness ulceration penetrating the subcutaneous tissue left foot-improved with no signs of infection.  Plan: -Continue wound care soaking twice daily warm Epsom salt water  15 minutes, apply Bactroban  ointment, and a light dressing. - Continue surgical shoe for all weightbearing left - Sharp debridement full-thickness ulcerations left foot.  Debrided into the subcutaneous tissue and debrided any devitalized tissue to a good bleeding base.  Applied antibiotic ointment and a light dressing    Return 2 weeks f/u ulcer

## 2024-02-13 DIAGNOSIS — N186 End stage renal disease: Secondary | ICD-10-CM | POA: Diagnosis not present

## 2024-02-13 DIAGNOSIS — Z992 Dependence on renal dialysis: Secondary | ICD-10-CM | POA: Diagnosis not present

## 2024-02-13 DIAGNOSIS — N2581 Secondary hyperparathyroidism of renal origin: Secondary | ICD-10-CM | POA: Diagnosis not present

## 2024-02-16 DIAGNOSIS — Z992 Dependence on renal dialysis: Secondary | ICD-10-CM | POA: Diagnosis not present

## 2024-02-16 DIAGNOSIS — N2581 Secondary hyperparathyroidism of renal origin: Secondary | ICD-10-CM | POA: Diagnosis not present

## 2024-02-16 DIAGNOSIS — N186 End stage renal disease: Secondary | ICD-10-CM | POA: Diagnosis not present

## 2024-02-18 DIAGNOSIS — N2581 Secondary hyperparathyroidism of renal origin: Secondary | ICD-10-CM | POA: Diagnosis not present

## 2024-02-18 DIAGNOSIS — Z992 Dependence on renal dialysis: Secondary | ICD-10-CM | POA: Diagnosis not present

## 2024-02-18 DIAGNOSIS — N186 End stage renal disease: Secondary | ICD-10-CM | POA: Diagnosis not present

## 2024-02-20 DIAGNOSIS — Z992 Dependence on renal dialysis: Secondary | ICD-10-CM | POA: Diagnosis not present

## 2024-02-20 DIAGNOSIS — N2581 Secondary hyperparathyroidism of renal origin: Secondary | ICD-10-CM | POA: Diagnosis not present

## 2024-02-20 DIAGNOSIS — N186 End stage renal disease: Secondary | ICD-10-CM | POA: Diagnosis not present

## 2024-02-23 DIAGNOSIS — L299 Pruritus, unspecified: Secondary | ICD-10-CM | POA: Diagnosis not present

## 2024-02-23 DIAGNOSIS — Z992 Dependence on renal dialysis: Secondary | ICD-10-CM | POA: Diagnosis not present

## 2024-02-23 DIAGNOSIS — N186 End stage renal disease: Secondary | ICD-10-CM | POA: Diagnosis not present

## 2024-02-23 DIAGNOSIS — N2581 Secondary hyperparathyroidism of renal origin: Secondary | ICD-10-CM | POA: Diagnosis not present

## 2024-02-25 DIAGNOSIS — N2581 Secondary hyperparathyroidism of renal origin: Secondary | ICD-10-CM | POA: Diagnosis not present

## 2024-02-25 DIAGNOSIS — Z992 Dependence on renal dialysis: Secondary | ICD-10-CM | POA: Diagnosis not present

## 2024-02-25 DIAGNOSIS — N186 End stage renal disease: Secondary | ICD-10-CM | POA: Diagnosis not present

## 2024-02-25 DIAGNOSIS — L299 Pruritus, unspecified: Secondary | ICD-10-CM | POA: Diagnosis not present

## 2024-02-26 NOTE — Addendum Note (Signed)
 Addended by: VICCI SELLER A on: 02/26/2024 11:59 AM   Modules accepted: Orders

## 2024-02-26 NOTE — Progress Notes (Signed)
 Remote ICD transmission.

## 2024-02-27 DIAGNOSIS — N2581 Secondary hyperparathyroidism of renal origin: Secondary | ICD-10-CM | POA: Diagnosis not present

## 2024-02-27 DIAGNOSIS — N186 End stage renal disease: Secondary | ICD-10-CM | POA: Diagnosis not present

## 2024-02-27 DIAGNOSIS — Z992 Dependence on renal dialysis: Secondary | ICD-10-CM | POA: Diagnosis not present

## 2024-02-27 DIAGNOSIS — L299 Pruritus, unspecified: Secondary | ICD-10-CM | POA: Diagnosis not present

## 2024-03-01 DIAGNOSIS — N2581 Secondary hyperparathyroidism of renal origin: Secondary | ICD-10-CM | POA: Diagnosis not present

## 2024-03-01 DIAGNOSIS — Z992 Dependence on renal dialysis: Secondary | ICD-10-CM | POA: Diagnosis not present

## 2024-03-01 DIAGNOSIS — N186 End stage renal disease: Secondary | ICD-10-CM | POA: Diagnosis not present

## 2024-03-02 ENCOUNTER — Ambulatory Visit: Admitting: Podiatry

## 2024-03-03 DIAGNOSIS — N186 End stage renal disease: Secondary | ICD-10-CM | POA: Diagnosis not present

## 2024-03-03 DIAGNOSIS — Z992 Dependence on renal dialysis: Secondary | ICD-10-CM | POA: Diagnosis not present

## 2024-03-03 DIAGNOSIS — N2581 Secondary hyperparathyroidism of renal origin: Secondary | ICD-10-CM | POA: Diagnosis not present

## 2024-03-04 DIAGNOSIS — I129 Hypertensive chronic kidney disease with stage 1 through stage 4 chronic kidney disease, or unspecified chronic kidney disease: Secondary | ICD-10-CM | POA: Diagnosis not present

## 2024-03-04 DIAGNOSIS — N186 End stage renal disease: Secondary | ICD-10-CM | POA: Diagnosis not present

## 2024-03-04 DIAGNOSIS — Z992 Dependence on renal dialysis: Secondary | ICD-10-CM | POA: Diagnosis not present

## 2024-03-05 ENCOUNTER — Telehealth (HOSPITAL_COMMUNITY): Payer: Self-pay

## 2024-03-05 DIAGNOSIS — N2581 Secondary hyperparathyroidism of renal origin: Secondary | ICD-10-CM | POA: Diagnosis not present

## 2024-03-05 DIAGNOSIS — Z992 Dependence on renal dialysis: Secondary | ICD-10-CM | POA: Diagnosis not present

## 2024-03-05 DIAGNOSIS — N186 End stage renal disease: Secondary | ICD-10-CM | POA: Diagnosis not present

## 2024-03-05 NOTE — Telephone Encounter (Signed)
 Paper work faxed to [employer] on 03/05/24 at [phone].Called patient and informed paper work ahs been sent

## 2024-03-08 DIAGNOSIS — E8770 Fluid overload, unspecified: Secondary | ICD-10-CM | POA: Diagnosis not present

## 2024-03-08 DIAGNOSIS — Z992 Dependence on renal dialysis: Secondary | ICD-10-CM | POA: Diagnosis not present

## 2024-03-08 DIAGNOSIS — N2581 Secondary hyperparathyroidism of renal origin: Secondary | ICD-10-CM | POA: Diagnosis not present

## 2024-03-08 DIAGNOSIS — N186 End stage renal disease: Secondary | ICD-10-CM | POA: Diagnosis not present

## 2024-03-09 DIAGNOSIS — E113512 Type 2 diabetes mellitus with proliferative diabetic retinopathy with macular edema, left eye: Secondary | ICD-10-CM | POA: Diagnosis not present

## 2024-03-10 DIAGNOSIS — N2581 Secondary hyperparathyroidism of renal origin: Secondary | ICD-10-CM | POA: Diagnosis not present

## 2024-03-10 DIAGNOSIS — E8770 Fluid overload, unspecified: Secondary | ICD-10-CM | POA: Diagnosis not present

## 2024-03-10 DIAGNOSIS — N186 End stage renal disease: Secondary | ICD-10-CM | POA: Diagnosis not present

## 2024-03-10 DIAGNOSIS — Z992 Dependence on renal dialysis: Secondary | ICD-10-CM | POA: Diagnosis not present

## 2024-03-12 DIAGNOSIS — N2581 Secondary hyperparathyroidism of renal origin: Secondary | ICD-10-CM | POA: Diagnosis not present

## 2024-03-12 DIAGNOSIS — Z992 Dependence on renal dialysis: Secondary | ICD-10-CM | POA: Diagnosis not present

## 2024-03-12 DIAGNOSIS — E8770 Fluid overload, unspecified: Secondary | ICD-10-CM | POA: Diagnosis not present

## 2024-03-12 DIAGNOSIS — N186 End stage renal disease: Secondary | ICD-10-CM | POA: Diagnosis not present

## 2024-03-13 DIAGNOSIS — Z992 Dependence on renal dialysis: Secondary | ICD-10-CM | POA: Diagnosis not present

## 2024-03-13 DIAGNOSIS — N186 End stage renal disease: Secondary | ICD-10-CM | POA: Diagnosis not present

## 2024-03-13 DIAGNOSIS — N2581 Secondary hyperparathyroidism of renal origin: Secondary | ICD-10-CM | POA: Diagnosis not present

## 2024-03-13 DIAGNOSIS — E8770 Fluid overload, unspecified: Secondary | ICD-10-CM | POA: Diagnosis not present

## 2024-03-15 DIAGNOSIS — N186 End stage renal disease: Secondary | ICD-10-CM | POA: Diagnosis not present

## 2024-03-15 DIAGNOSIS — N2581 Secondary hyperparathyroidism of renal origin: Secondary | ICD-10-CM | POA: Diagnosis not present

## 2024-03-15 DIAGNOSIS — Z992 Dependence on renal dialysis: Secondary | ICD-10-CM | POA: Diagnosis not present

## 2024-03-16 ENCOUNTER — Telehealth: Payer: Self-pay | Admitting: Pharmacist

## 2024-03-16 NOTE — Progress Notes (Signed)
   03/16/2024  Patient ID: [name], female   DOB: Dec 25, 1970, 53 y.o.   MRN: [id number]  Called patient to verify that the new insulin  pump supplies were picked up and training completed. Patient confirmed both have been completed and are going well at this time.   Reports she needs the Novolog  script filled at [facility] as her usual pharmacy is trying to charge too much. Advised they should already have a script on-file and I will give them a call to fill it now. Patient said she can give a manufacturer coupon to lower cost from online if needed.   Called [facility]. Had Novolog  filled and they reported it at no charge for pick-up. Will work on filling it now.    Aloysius Lewis, PharmD [facility]  Phone Number: [phone]

## 2024-03-17 DIAGNOSIS — Z992 Dependence on renal dialysis: Secondary | ICD-10-CM | POA: Diagnosis not present

## 2024-03-17 DIAGNOSIS — N2581 Secondary hyperparathyroidism of renal origin: Secondary | ICD-10-CM | POA: Diagnosis not present

## 2024-03-17 DIAGNOSIS — N186 End stage renal disease: Secondary | ICD-10-CM | POA: Diagnosis not present

## 2024-03-19 DIAGNOSIS — Z992 Dependence on renal dialysis: Secondary | ICD-10-CM | POA: Diagnosis not present

## 2024-03-19 DIAGNOSIS — N186 End stage renal disease: Secondary | ICD-10-CM | POA: Diagnosis not present

## 2024-03-19 DIAGNOSIS — N2581 Secondary hyperparathyroidism of renal origin: Secondary | ICD-10-CM | POA: Diagnosis not present

## 2024-03-24 ENCOUNTER — Other Ambulatory Visit: Payer: Self-pay

## 2024-03-24 ENCOUNTER — Encounter (HOSPITAL_COMMUNITY)
Admission: RE | Disposition: A | Discharge status: Home / Self Care | Payer: Self-pay | Source: Home / Self Care | Attending: Vascular Surgery

## 2024-03-24 ENCOUNTER — Ambulatory Visit (HOSPITAL_COMMUNITY)
Admission: RE | Admit: 2024-03-24 | Discharge: 2024-03-24 | Disposition: A | Discharge status: Home / Self Care | Attending: Vascular Surgery | Admitting: Vascular Surgery

## 2024-03-24 DIAGNOSIS — E1022 Type 1 diabetes mellitus with diabetic chronic kidney disease: Secondary | ICD-10-CM | POA: Insufficient documentation

## 2024-03-24 DIAGNOSIS — Y832 Surgical operation with anastomosis, bypass or graft as the cause of abnormal reaction of the patient, or of later complication, without mention of misadventure at the time of the procedure: Secondary | ICD-10-CM | POA: Insufficient documentation

## 2024-03-24 DIAGNOSIS — T82868A Thrombosis of vascular prosthetic devices, implants and grafts, initial encounter: Secondary | ICD-10-CM | POA: Diagnosis not present

## 2024-03-24 DIAGNOSIS — T82858A Stenosis of vascular prosthetic devices, implants and grafts, initial encounter: Secondary | ICD-10-CM

## 2024-03-24 DIAGNOSIS — Z992 Dependence on renal dialysis: Secondary | ICD-10-CM | POA: Insufficient documentation

## 2024-03-24 DIAGNOSIS — N186 End stage renal disease: Secondary | ICD-10-CM | POA: Insufficient documentation

## 2024-03-24 DIAGNOSIS — N2581 Secondary hyperparathyroidism of renal origin: Secondary | ICD-10-CM | POA: Diagnosis not present

## 2024-03-24 HISTORY — PX: PERIPHERAL VASCULAR THROMBECTOMY: CATH118306

## 2024-03-24 HISTORY — PX: VENOUS STENT: CATH118377

## 2024-03-24 SURGERY — PERIPHERAL VASCULAR THROMBECTOMY
Anesthesia: LOCAL | Site: Thigh | Laterality: Left

## 2024-03-24 MED ORDER — METHYLPREDNISOLONE SODIUM SUCC 125 MG IJ SOLR
INTRAMUSCULAR | Status: DC | PRN
  Administered 2024-03-24: 125 mg via INTRAVENOUS

## 2024-03-24 MED ORDER — LIDOCAINE HCL (PF) 1 % IJ SOLN
INTRAMUSCULAR | Status: DC | PRN
  Administered 2024-03-24: 5 mL

## 2024-03-24 MED ORDER — FENTANYL CITRATE (PF) 100 MCG/2ML IJ SOLN
INTRAMUSCULAR | Status: AC
Start: 2024-03-24 — End: 2024-03-24
  Filled 2024-03-24: qty 2

## 2024-03-24 MED ORDER — HEPARIN SODIUM (PORCINE) 1000 UNIT/ML IJ SOLN
INTRAMUSCULAR | Status: DC | PRN
  Administered 2024-03-24: 8000 [IU] via INTRAVENOUS

## 2024-03-24 MED ORDER — HEPARIN SODIUM (PORCINE) 1000 UNIT/ML IJ SOLN
INTRAMUSCULAR | Status: AC
  Filled 2024-03-24: qty 10

## 2024-03-24 MED ORDER — MIDAZOLAM HCL 2 MG/2ML IJ SOLN
INTRAMUSCULAR | Status: DC | PRN
  Administered 2024-03-24: 1 mg via INTRAVENOUS

## 2024-03-24 MED ORDER — HEPARIN (PORCINE) IN NACL 1000-0.9 UT/500ML-% IV SOLN
INTRAVENOUS | Status: DC | PRN
  Administered 2024-03-24: 500 mL

## 2024-03-24 MED ORDER — LIDOCAINE HCL (PF) 1 % IJ SOLN
INTRAMUSCULAR | Status: AC
  Filled 2024-03-24: qty 30

## 2024-03-24 MED ORDER — DIPHENHYDRAMINE HCL 50 MG/ML IJ SOLN
INTRAMUSCULAR | Status: DC | PRN
  Administered 2024-03-24: 50 mg via INTRAVENOUS

## 2024-03-24 MED ORDER — DIPHENHYDRAMINE HCL 50 MG/ML IJ SOLN
INTRAMUSCULAR | Status: AC
  Filled 2024-03-24: qty 1

## 2024-03-24 MED ORDER — MIDAZOLAM HCL 2 MG/2ML IJ SOLN
INTRAMUSCULAR | Status: AC
  Filled 2024-03-24: qty 2

## 2024-03-24 MED ORDER — FENTANYL CITRATE (PF) 100 MCG/2ML IJ SOLN
INTRAMUSCULAR | Status: DC | PRN
  Administered 2024-03-24: 50 ug via INTRAVENOUS

## 2024-03-24 MED ORDER — IODIXANOL 320 MG/ML IV SOLN
INTRAVENOUS | Status: DC | PRN
  Administered 2024-03-24: 35 mL via INTRAVENOUS

## 2024-03-24 MED ORDER — METHYLPREDNISOLONE SODIUM SUCC 125 MG IJ SOLR
INTRAMUSCULAR | Status: AC
  Filled 2024-03-24: qty 2

## 2024-03-24 SURGICAL SUPPLY — 14 items
BALLOON MUSTANG 5.0X40 75 (BALLOONS) IMPLANT
BALLOON MUSTANG 7X80X75 (BALLOONS) IMPLANT
BALLOON MUSTANG 8X60X75 (BALLOONS) IMPLANT
CATH STR 7FR 55 BRITE (CATHETERS) IMPLANT
GUIDEWIRE ANGLED .035 180CM (WIRE) IMPLANT
KIT ENCORE 26 ADVANTAGE (KITS) IMPLANT
KIT MICROPUNCTURE NIT STIFF (SHEATH) IMPLANT
SHEATH PINNACLE R/O II 6F 4CM (SHEATH) IMPLANT
SHEATH PINNACLE R/O II 7F 4CM (SHEATH) IMPLANT
SHEATH PROBE COVER 6X72 (BAG) IMPLANT
STENT EPIC VASCULAR 8X40X75 (Permanent Stent) IMPLANT
TRAY PV CATH (CUSTOM PROCEDURE TRAY) ×3 IMPLANT
WIRE BENTSON .035X145CM (WIRE) IMPLANT
WIRE TORQFLEX AUST .018X40CM (WIRE) IMPLANT

## 2024-03-24 NOTE — H&P (Signed)
 HD ACCESS CENTER H&P   Patient ID: [name], female   DOB: 04-13-1971, 53 y.o.   MRN: [id number]  Subjective:     HPI [name] is a 53 y.o. female with ESRD presenting to the HD access center for intervention.  Past Medical History:  Diagnosis Date   AICD (automatic cardioverter/defibrillator) present    [employer]   Anemia    Anxiety    Arthritis    Asthma    CHF (congestive heart failure) (HCC)    Chronic diastolic CHF (congestive heart failure) (HCC) 12/14/2019   Depression    Diabetic ulcer of left foot (HCC) 05/12/2013   ESRD on hemodialysis (HCC)    MWF at [facility]   Gastroparesis    Generalized abdominal pain    History of chicken pox    Loss of weight 12/02/2019   Migraines    Mixed hyperlipidemia 09/05/2022   Mood disorder (HCC)    anxiety   Myocardial infarction (HCC)    NSTEMI (non-ST elevated myocardial infarction) (HCC) 05/13/2022   Prurigo nodularis    with diabetic dermopathy   Type 1 diabetes mellitus (HCC) 05/23/2022   Type 1 diabetes, uncontrolled, with neuropathy    [place]   Ulcers of both lower legs (HCC) 02/20/2014   Family History  Problem Relation Age of Onset   Diabetes Mother        type 2   Hypertension Mother    Thyroid  disease Mother    Bipolar disorder Mother    Heart disease Mother    Calcium  disorder Mother    Cancer Maternal Grandmother        Breast, stomach   Cancer Paternal Grandmother        stomach, lung (smoker)   Diabetes Paternal Grandmother    Diabetes Paternal Grandfather    Heart failure Sister    Diabetes Sister    Stroke Maternal Aunt    Cancer Maternal Uncle        prostate   CAD Maternal Aunt        stents   Cancer Maternal Aunt 41       ovarian   Past Surgical History:  Procedure Laterality Date   A/V SHUNT INTERVENTION N/A 11/06/2023   Procedure: A/V SHUNT INTERVENTION;  Surgeon: Melia Lynwood ORN, MD;  Location: [facility] INVASIVE CV LAB;  Service: Cardiovascular;  Laterality: N/A;   AV FISTULA  PLACEMENT Left 10/01/2022   Procedure: ARTERIOVENOUS (AV)FISTULA CREATION;  Surgeon: Oris Krystal FALCON, MD;  Location: [facility] ORS;  Service: Vascular;  Laterality: Left;   AV FISTULA PLACEMENT Right 11/19/2022   Procedure: INSERTION OF RIGHT ARM ARTERIOVENOUS (AV) GORE-TEX GRAFT;  Surgeon: Eliza Lonni RAMAN, MD;  Location: [facility] OR;  Service: Vascular;  Laterality: Right;   AV FISTULA PLACEMENT Right 07/17/2023   Procedure: INSERTION OF ARTERIOVENOUS (AV) GOR-TEX GRAFT RIGHT ARM;  Surgeon: Magda Debby SAILOR, MD;  Location: [facility] OR;  Service: Vascular;  Laterality: Right;   AV FISTULA PLACEMENT Left 09/18/2023   Procedure: INSERTION OF ARTERIOVENOUS (AV) GORE-TEX GRAFT THIGH;  Surgeon: Magda Debby SAILOR, MD;  Location: [facility] OR;  Service: Vascular;  Laterality: Left;   BIOPSY  08/11/2020   Procedure: BIOPSY;  Surgeon: Aneita Gwendlyn DASEN, MD;  Location: [facility] ENDOSCOPY;  Service: Endoscopy;;   BIOPSY  04/02/2022   Procedure: BIOPSY;  Surgeon: Charlanne Groom, MD;  Location: [facility] ENDOSCOPY;  Service: Gastroenterology;;   ESOPHAGOGASTRODUODENOSCOPY (EGD) WITH PROPOFOL  N/A 08/11/2020   Procedure: ESOPHAGOGASTRODUODENOSCOPY (EGD) WITH PROPOFOL ;  Surgeon: Aneita Gwendlyn DASEN,  MD;  Location: [facility] ENDOSCOPY;  Service: Endoscopy;  Laterality: N/A;   ESOPHAGOGASTRODUODENOSCOPY (EGD) WITH PROPOFOL  N/A 04/02/2022   Procedure: ESOPHAGOGASTRODUODENOSCOPY (EGD) WITH PROPOFOL ;  Surgeon: Charlanne Groom, MD;  Location: [facility] ENDOSCOPY;  Service: Gastroenterology;  Laterality: N/A;   ESOPHAGOGASTRODUODENOSCOPY (EGD) WITH PROPOFOL  N/A 05/23/2022   Procedure: ESOPHAGOGASTRODUODENOSCOPY (EGD) WITH PROPOFOL ;  Surgeon: Jinny Carmine, MD;  Location: [facility] ENDOSCOPY;  Service: Endoscopy;  Laterality: N/A;   ESOPHAGOGASTRODUODENOSCOPY (EGD) WITH PROPOFOL  N/A 07/15/2023   Procedure: ESOPHAGOGASTRODUODENOSCOPY (EGD) WITH PROPOFOL ;  Surgeon: Dianna Specking, MD;  Location: [facility] ENDOSCOPY;  Service: Gastroenterology;  Laterality: N/A;   ICD IMPLANT N/A 04/09/2022    Procedure: ICD IMPLANT;  Surgeon: Inocencio Soyla Lunger, MD;  Location: [facility] INVASIVE CV LAB;  Service: Cardiovascular;  Laterality: N/A;   IR FLUORO GUIDE CV LINE RIGHT  10/07/2022   IR US  GUIDE VASC ACCESS RIGHT  10/07/2022   treadmill stress test  01/2013   WNL, low risk study    Short Social History:  Social History   Tobacco Use   Smoking status: Never   Smokeless tobacco: Never  Substance Use Topics   Alcohol  use: Not Currently    Allergies  Allergen Reactions   Atorvastatin  Other (See Comments)    Dizziness    Dulaglutide Nausea And Vomiting and Other (See Comments)    Caused pancreatitis    Ivp Dye [Iodinated Contrast Media] Hives, Itching and Nausea And Vomiting    tremors   Metoclopramide  Other (See Comments)    Possible movement disorder or tardive dyskinesia while on reglan  Long term   Other Itching    Peas and carrots   Sertraline Nausea And Vomiting   Vancomycin  Itching and Swelling   Zofran  [Ondansetron ] Nausea And Vomiting and Other (See Comments)    Causes nausea vomiting to worsen / doesn't really work for patient.   Amoxicillin Itching and Rash   Lantus  [Insulin  Glargine] Itching and Rash    Has tolerated insulin  glargine many times with no reported issues   Miconazole Nitrate Nausea And Vomiting and Rash    No current facility-administered medications for this encounter.   Facility-Administered Medications Ordered in Other Encounters  Medication Dose Route Frequency Provider Last Rate Last Admin   alteplase  (CATHFLO ACTIVASE ) injection 2 mg  2 mg Intracatheter Once PRN Gerome, Samantha G, PA-C       anticoagulant sodium citrate  solution 5 mL  5 mL Intracatheter PRN Collins, Samantha G, PA-C       heparin  injection 1,000 Units  1,000 Units Intracatheter PRN Collins, Samantha G, PA-C   5,000 Units at 07/17/23 9170   lidocaine  (PF) (XYLOCAINE ) 1 % injection 5 mL  5 mL Intradermal PRN Collins, Samantha G, PA-C       lidocaine -prilocaine  (EMLA ) cream 1  Application  1 Application Topical PRN Collins, Samantha G, PA-C       pentafluoroprop-tetrafluoroeth (GEBAUERS) aerosol 1 Application  1 Application Topical PRN Collins, Samantha G, PA-C        REVIEW OF SYSTEMS All other systems were reviewed and are negative     Objective:   Objective   Vitals:   03/24/24 0744 03/24/24 0754 03/24/24 0822  BP: (!) 178/102 (!) 178/102   Pulse: 91 96   Resp: 12 14   Temp: 98.5 F (36.9 C)    TempSrc: Oral    SpO2: 99% 98% 100%   There is no height or weight on file to calculate BMI.  Physical Exam General: no acute distress Cardiac: hemodynamically stable Extremities:  No pulse or thrill in left AVG      Assessment/Plan:   [name] is a 53 y.o. female with ESRD presenting for AVG thrombectomy.  Having issues with clotted access. Last HD session Friday. Reviewed risks and benefits of thrombectomy, possible tunneled dialysis catheter placement and patient agreed to proceed.   Megan Serve, MD [facility]

## 2024-03-24 NOTE — Op Note (Signed)
 Patient name: [name] MRN: [id number] DOB: 05-20-1971 Sex: female  03/24/2024 Pre-operative Diagnosis: ESRD on HD Post-operative diagnosis:  Same Surgeon:  Norman GORMAN Serve, MD Procedure Performed:  Ultrasound-guided access of left thigh AVG in retrograde fashion Ultrasound-guided access of left thigh AVG in antegrade fashion Mechanical thrombectomy left thigh AVG Balloon angioplasty of left thigh AVG, 7 mm and 8 mm Mustang Stent placement of venous anastomosis, 8 mm x 40 mm epic 41 minutes moderate sedation with fentanyl  and Versed   Indications: [name] is a 53 year old female with ESRD on HD who presented to the HD access center for AVG thrombectomy.  She is had a clotted access of her left thigh AVG.  Her last HD session was Friday.  Risk benefits of thrombectomy with possible tunneled dialysis catheter placement were reviewed and she elected to proceed.  Findings:  Complete thrombosis of the left AVG. Wide patency of left common femoral, SFA and profunda arteries. Wide patency of left iliac veins and IVC confluence. Severe greater than 70% stenosis of the venous anastomosis   Procedure:  The patient was identified in the holding area and taken to the cath lab  The patient was then placed supine on the table and prepped and draped in the usual sterile fashion.  A time out was called.  Ultrasound was used to evaluate the left thigh AV access. This was accessed under u/s guidance and an antegrade fashion. An 018 wire was advanced without resistance, a micropuncture sheath was placed and a Bentson wire was placed through the thrombosed graft and into the central system.  The access was then upsized to a 7 [place] sheath and a 7 [place] guide cath was placed over this wire and into the central system.  A central venogram was then obtained which demonstrated patency of the left common femoral vein and iliac vein.  Via the catheter in the central system premedications of 125 mg  Solu-Medrol  and 50 mg of Benadryl  were administered for her contrast allergy.  1 mg of Versed  and 50 mg of fentanyl  were given for sedation and 8000 units of heparin  was given.  I then performed 2 passes of a 7 French glide cath under aspiration to aspirate any acute thrombus. The wire was then replaced into the central system and the entire graft was ballooned with a 7 mm Mustang balloon. A fistulogram was then obtained which demonstrated a stenosis at the venous anastomosis. Ultrasound was then used to evaluate the graft in the medial thigh. The skin was anesthetized and this was accessed under ultrasound guidance in a retrograde fashion. The 018 wire was advanced without resistance and a micropuncture sheath was placed. A Glidewire was then placed through the micropuncture sheath and this access was upsized to a 6 [place] sheath. Using the Glidewire was able to cross the arterial anastomosis and the wire was passed into the external iliac artery. Over this wire a 5 mm Mustang balloon was tracked into the common femoral artery, inflated to about 4 atm and gently pulled back to pull the arterial plug.  A fistulogram demonstrated an excellent result with wide patency of the arterial anastomosis and graft.  There was still an approximate 70% stenosis of the venous anastomosis.  I attempted balloon angioplasty with an 8 mm balloon although this did not fully resolve the filling defect.  Therefore an 8 mm x 40 mm epic stent was deployed at the venous anastomosis and this was postdilated with a 7 mm Mustang balloon.  A completion fistulogram demonstrated brisk flow through the graft with excellent patency of the venous anastomosis stent without residual stenosis.  The wires and sheaths were removed and the access was managed with a 4 Monocryl figure-of-eight suture for hemostasis.  Contrast: 35 cc Sedation: 41 minutes  Impression: Successful AVG thrombectomy of the left thigh graft.  Stenting of the venous  anastomosis with 8 mm x 40 mm epic stent   Norman GORMAN Serve MD [facility] [place] Office: [phone]

## 2024-03-25 ENCOUNTER — Encounter (HOSPITAL_COMMUNITY): Payer: Self-pay | Admitting: Vascular Surgery

## 2024-03-26 DIAGNOSIS — N2581 Secondary hyperparathyroidism of renal origin: Secondary | ICD-10-CM | POA: Diagnosis not present

## 2024-03-26 DIAGNOSIS — N186 End stage renal disease: Secondary | ICD-10-CM | POA: Diagnosis not present

## 2024-03-26 DIAGNOSIS — Z992 Dependence on renal dialysis: Secondary | ICD-10-CM | POA: Diagnosis not present

## 2024-03-29 DIAGNOSIS — N186 End stage renal disease: Secondary | ICD-10-CM | POA: Diagnosis not present

## 2024-03-29 DIAGNOSIS — Z992 Dependence on renal dialysis: Secondary | ICD-10-CM | POA: Diagnosis not present

## 2024-03-29 DIAGNOSIS — N2581 Secondary hyperparathyroidism of renal origin: Secondary | ICD-10-CM | POA: Diagnosis not present

## 2024-03-31 DIAGNOSIS — N186 End stage renal disease: Secondary | ICD-10-CM | POA: Diagnosis not present

## 2024-03-31 DIAGNOSIS — Z992 Dependence on renal dialysis: Secondary | ICD-10-CM | POA: Diagnosis not present

## 2024-03-31 DIAGNOSIS — N2581 Secondary hyperparathyroidism of renal origin: Secondary | ICD-10-CM | POA: Diagnosis not present

## 2024-04-01 DIAGNOSIS — N186 End stage renal disease: Secondary | ICD-10-CM | POA: Diagnosis not present

## 2024-04-01 DIAGNOSIS — N2581 Secondary hyperparathyroidism of renal origin: Secondary | ICD-10-CM | POA: Diagnosis not present

## 2024-04-01 DIAGNOSIS — Z992 Dependence on renal dialysis: Secondary | ICD-10-CM | POA: Diagnosis not present

## 2024-04-02 DIAGNOSIS — N2581 Secondary hyperparathyroidism of renal origin: Secondary | ICD-10-CM | POA: Diagnosis not present

## 2024-04-02 DIAGNOSIS — N186 End stage renal disease: Secondary | ICD-10-CM | POA: Diagnosis not present

## 2024-04-02 DIAGNOSIS — Z992 Dependence on renal dialysis: Secondary | ICD-10-CM | POA: Diagnosis not present

## 2024-04-04 DIAGNOSIS — I129 Hypertensive chronic kidney disease with stage 1 through stage 4 chronic kidney disease, or unspecified chronic kidney disease: Secondary | ICD-10-CM | POA: Diagnosis not present

## 2024-04-04 DIAGNOSIS — Z992 Dependence on renal dialysis: Secondary | ICD-10-CM | POA: Diagnosis not present

## 2024-04-04 DIAGNOSIS — N186 End stage renal disease: Secondary | ICD-10-CM | POA: Diagnosis not present

## 2024-04-05 DIAGNOSIS — N2581 Secondary hyperparathyroidism of renal origin: Secondary | ICD-10-CM | POA: Diagnosis not present

## 2024-04-05 DIAGNOSIS — Z992 Dependence on renal dialysis: Secondary | ICD-10-CM | POA: Diagnosis not present

## 2024-04-05 DIAGNOSIS — N186 End stage renal disease: Secondary | ICD-10-CM | POA: Diagnosis not present

## 2024-04-06 DIAGNOSIS — H35033 Hypertensive retinopathy, bilateral: Secondary | ICD-10-CM | POA: Diagnosis not present

## 2024-04-06 DIAGNOSIS — E113513 Type 2 diabetes mellitus with proliferative diabetic retinopathy with macular edema, bilateral: Secondary | ICD-10-CM | POA: Diagnosis not present

## 2024-04-06 DIAGNOSIS — H2513 Age-related nuclear cataract, bilateral: Secondary | ICD-10-CM | POA: Diagnosis not present

## 2024-04-06 DIAGNOSIS — H3582 Retinal ischemia: Secondary | ICD-10-CM | POA: Diagnosis not present

## 2024-04-06 DIAGNOSIS — H43823 Vitreomacular adhesion, bilateral: Secondary | ICD-10-CM | POA: Diagnosis not present

## 2024-04-07 DIAGNOSIS — N2581 Secondary hyperparathyroidism of renal origin: Secondary | ICD-10-CM | POA: Diagnosis not present

## 2024-04-07 DIAGNOSIS — N186 End stage renal disease: Secondary | ICD-10-CM | POA: Diagnosis not present

## 2024-04-07 DIAGNOSIS — Z992 Dependence on renal dialysis: Secondary | ICD-10-CM | POA: Diagnosis not present

## 2024-04-08 ENCOUNTER — Ambulatory Visit (INDEPENDENT_AMBULATORY_CARE_PROVIDER_SITE_OTHER)

## 2024-04-08 DIAGNOSIS — I5042 Chronic combined systolic (congestive) and diastolic (congestive) heart failure: Secondary | ICD-10-CM

## 2024-04-09 ENCOUNTER — Ambulatory Visit: Payer: Self-pay | Admitting: Cardiovascular Disease

## 2024-04-09 DIAGNOSIS — N186 End stage renal disease: Secondary | ICD-10-CM | POA: Diagnosis not present

## 2024-04-09 DIAGNOSIS — Z992 Dependence on renal dialysis: Secondary | ICD-10-CM | POA: Diagnosis not present

## 2024-04-09 DIAGNOSIS — N2581 Secondary hyperparathyroidism of renal origin: Secondary | ICD-10-CM | POA: Diagnosis not present

## 2024-04-09 LAB — CUP PACEART REMOTE DEVICE CHECK
Battery Remaining Longevity: 119 mo
Battery Voltage: 3.03 V
Brady Statistic RV Percent Paced: 0 %
Date Time Interrogation Session: 20250904033527
HighPow Impedance: 63 Ohm
Implantable Lead Connection Status: 753985
Implantable Lead Implant Date: 20230905
Implantable Lead Location: 753860
Implantable Pulse Generator Implant Date: 20230905
Lead Channel Impedance Value: 266 Ohm
Lead Channel Impedance Value: 342 Ohm
Lead Channel Pacing Threshold Amplitude: 0.75 V
Lead Channel Pacing Threshold Pulse Width: 0.4 ms
Lead Channel Sensing Intrinsic Amplitude: 5.375 mV
Lead Channel Sensing Intrinsic Amplitude: 5.375 mV
Lead Channel Setting Pacing Amplitude: 2 V
Lead Channel Setting Pacing Pulse Width: 0.4 ms
Lead Channel Setting Sensing Sensitivity: 0.3 mV
Zone Setting Status: 755011
Zone Setting Status: 755011

## 2024-04-12 DIAGNOSIS — N186 End stage renal disease: Secondary | ICD-10-CM | POA: Diagnosis not present

## 2024-04-12 DIAGNOSIS — N2581 Secondary hyperparathyroidism of renal origin: Secondary | ICD-10-CM | POA: Diagnosis not present

## 2024-04-12 DIAGNOSIS — Z992 Dependence on renal dialysis: Secondary | ICD-10-CM | POA: Diagnosis not present

## 2024-04-14 DIAGNOSIS — N186 End stage renal disease: Secondary | ICD-10-CM | POA: Diagnosis not present

## 2024-04-14 DIAGNOSIS — N2581 Secondary hyperparathyroidism of renal origin: Secondary | ICD-10-CM | POA: Diagnosis not present

## 2024-04-14 DIAGNOSIS — Z992 Dependence on renal dialysis: Secondary | ICD-10-CM | POA: Diagnosis not present

## 2024-04-16 DIAGNOSIS — Z992 Dependence on renal dialysis: Secondary | ICD-10-CM | POA: Diagnosis not present

## 2024-04-16 DIAGNOSIS — N186 End stage renal disease: Secondary | ICD-10-CM | POA: Diagnosis not present

## 2024-04-16 DIAGNOSIS — N2581 Secondary hyperparathyroidism of renal origin: Secondary | ICD-10-CM | POA: Diagnosis not present

## 2024-04-17 NOTE — Progress Notes (Signed)
Remote ICD Transmission.

## 2024-04-19 DIAGNOSIS — N2581 Secondary hyperparathyroidism of renal origin: Secondary | ICD-10-CM | POA: Diagnosis not present

## 2024-04-19 DIAGNOSIS — N186 End stage renal disease: Secondary | ICD-10-CM | POA: Diagnosis not present

## 2024-04-19 DIAGNOSIS — Z992 Dependence on renal dialysis: Secondary | ICD-10-CM | POA: Diagnosis not present

## 2024-04-21 DIAGNOSIS — N186 End stage renal disease: Secondary | ICD-10-CM | POA: Diagnosis not present

## 2024-04-21 DIAGNOSIS — Z992 Dependence on renal dialysis: Secondary | ICD-10-CM | POA: Diagnosis not present

## 2024-04-21 DIAGNOSIS — N2581 Secondary hyperparathyroidism of renal origin: Secondary | ICD-10-CM | POA: Diagnosis not present

## 2024-04-23 DIAGNOSIS — Z992 Dependence on renal dialysis: Secondary | ICD-10-CM | POA: Diagnosis not present

## 2024-04-23 DIAGNOSIS — N2581 Secondary hyperparathyroidism of renal origin: Secondary | ICD-10-CM | POA: Diagnosis not present

## 2024-04-23 DIAGNOSIS — N186 End stage renal disease: Secondary | ICD-10-CM | POA: Diagnosis not present

## 2024-04-26 DIAGNOSIS — N2581 Secondary hyperparathyroidism of renal origin: Secondary | ICD-10-CM | POA: Diagnosis not present

## 2024-04-26 DIAGNOSIS — Z992 Dependence on renal dialysis: Secondary | ICD-10-CM | POA: Diagnosis not present

## 2024-04-26 DIAGNOSIS — N186 End stage renal disease: Secondary | ICD-10-CM | POA: Diagnosis not present

## 2024-04-28 DIAGNOSIS — N186 End stage renal disease: Secondary | ICD-10-CM | POA: Diagnosis not present

## 2024-04-28 DIAGNOSIS — Z992 Dependence on renal dialysis: Secondary | ICD-10-CM | POA: Diagnosis not present

## 2024-04-28 DIAGNOSIS — N2581 Secondary hyperparathyroidism of renal origin: Secondary | ICD-10-CM | POA: Diagnosis not present

## 2024-04-30 DIAGNOSIS — Z992 Dependence on renal dialysis: Secondary | ICD-10-CM | POA: Diagnosis not present

## 2024-04-30 DIAGNOSIS — N186 End stage renal disease: Secondary | ICD-10-CM | POA: Diagnosis not present

## 2024-04-30 DIAGNOSIS — N2581 Secondary hyperparathyroidism of renal origin: Secondary | ICD-10-CM | POA: Diagnosis not present

## 2024-05-03 DIAGNOSIS — N186 End stage renal disease: Secondary | ICD-10-CM | POA: Diagnosis not present

## 2024-05-03 DIAGNOSIS — N2581 Secondary hyperparathyroidism of renal origin: Secondary | ICD-10-CM | POA: Diagnosis not present

## 2024-05-03 DIAGNOSIS — Z992 Dependence on renal dialysis: Secondary | ICD-10-CM | POA: Diagnosis not present

## 2024-05-04 DIAGNOSIS — I129 Hypertensive chronic kidney disease with stage 1 through stage 4 chronic kidney disease, or unspecified chronic kidney disease: Secondary | ICD-10-CM | POA: Diagnosis not present

## 2024-05-04 DIAGNOSIS — E113511 Type 2 diabetes mellitus with proliferative diabetic retinopathy with macular edema, right eye: Secondary | ICD-10-CM | POA: Diagnosis not present

## 2024-05-05 DIAGNOSIS — N186 End stage renal disease: Secondary | ICD-10-CM | POA: Diagnosis not present

## 2024-05-05 DIAGNOSIS — Z992 Dependence on renal dialysis: Secondary | ICD-10-CM | POA: Diagnosis not present

## 2024-05-05 DIAGNOSIS — Z23 Encounter for immunization: Secondary | ICD-10-CM | POA: Diagnosis not present

## 2024-05-05 DIAGNOSIS — N2581 Secondary hyperparathyroidism of renal origin: Secondary | ICD-10-CM | POA: Diagnosis not present

## 2024-05-07 DIAGNOSIS — N2581 Secondary hyperparathyroidism of renal origin: Secondary | ICD-10-CM | POA: Diagnosis not present

## 2024-05-07 DIAGNOSIS — Z23 Encounter for immunization: Secondary | ICD-10-CM | POA: Diagnosis not present

## 2024-05-07 DIAGNOSIS — N186 End stage renal disease: Secondary | ICD-10-CM | POA: Diagnosis not present

## 2024-05-07 DIAGNOSIS — Z992 Dependence on renal dialysis: Secondary | ICD-10-CM | POA: Diagnosis not present

## 2024-05-10 DIAGNOSIS — Z992 Dependence on renal dialysis: Secondary | ICD-10-CM | POA: Diagnosis not present

## 2024-05-10 DIAGNOSIS — N2581 Secondary hyperparathyroidism of renal origin: Secondary | ICD-10-CM | POA: Diagnosis not present

## 2024-05-10 DIAGNOSIS — N186 End stage renal disease: Secondary | ICD-10-CM | POA: Diagnosis not present

## 2024-05-12 DIAGNOSIS — Z992 Dependence on renal dialysis: Secondary | ICD-10-CM | POA: Diagnosis not present

## 2024-05-12 DIAGNOSIS — N186 End stage renal disease: Secondary | ICD-10-CM | POA: Diagnosis not present

## 2024-05-12 DIAGNOSIS — N2581 Secondary hyperparathyroidism of renal origin: Secondary | ICD-10-CM | POA: Diagnosis not present

## 2024-05-14 DIAGNOSIS — Z992 Dependence on renal dialysis: Secondary | ICD-10-CM | POA: Diagnosis not present

## 2024-05-14 DIAGNOSIS — N186 End stage renal disease: Secondary | ICD-10-CM | POA: Diagnosis not present

## 2024-05-14 DIAGNOSIS — N2581 Secondary hyperparathyroidism of renal origin: Secondary | ICD-10-CM | POA: Diagnosis not present

## 2024-05-17 DIAGNOSIS — N2581 Secondary hyperparathyroidism of renal origin: Secondary | ICD-10-CM | POA: Diagnosis not present

## 2024-05-17 DIAGNOSIS — Z992 Dependence on renal dialysis: Secondary | ICD-10-CM | POA: Diagnosis not present

## 2024-05-17 DIAGNOSIS — N186 End stage renal disease: Secondary | ICD-10-CM | POA: Diagnosis not present

## 2024-05-19 DIAGNOSIS — N186 End stage renal disease: Secondary | ICD-10-CM | POA: Diagnosis not present

## 2024-05-19 DIAGNOSIS — Z992 Dependence on renal dialysis: Secondary | ICD-10-CM | POA: Diagnosis not present

## 2024-05-19 DIAGNOSIS — N2581 Secondary hyperparathyroidism of renal origin: Secondary | ICD-10-CM | POA: Diagnosis not present

## 2024-05-21 DIAGNOSIS — N2581 Secondary hyperparathyroidism of renal origin: Secondary | ICD-10-CM | POA: Diagnosis not present

## 2024-05-21 DIAGNOSIS — N186 End stage renal disease: Secondary | ICD-10-CM | POA: Diagnosis not present

## 2024-05-21 DIAGNOSIS — Z992 Dependence on renal dialysis: Secondary | ICD-10-CM | POA: Diagnosis not present

## 2024-05-24 DIAGNOSIS — Z992 Dependence on renal dialysis: Secondary | ICD-10-CM | POA: Diagnosis not present

## 2024-05-24 DIAGNOSIS — N186 End stage renal disease: Secondary | ICD-10-CM | POA: Diagnosis not present

## 2024-05-24 DIAGNOSIS — N2581 Secondary hyperparathyroidism of renal origin: Secondary | ICD-10-CM | POA: Diagnosis not present

## 2024-05-26 DIAGNOSIS — N2581 Secondary hyperparathyroidism of renal origin: Secondary | ICD-10-CM | POA: Diagnosis not present

## 2024-05-26 DIAGNOSIS — Z992 Dependence on renal dialysis: Secondary | ICD-10-CM | POA: Diagnosis not present

## 2024-05-26 DIAGNOSIS — N186 End stage renal disease: Secondary | ICD-10-CM | POA: Diagnosis not present

## 2024-05-27 ENCOUNTER — Ambulatory Visit

## 2024-05-27 ENCOUNTER — Ambulatory Visit (INDEPENDENT_AMBULATORY_CARE_PROVIDER_SITE_OTHER)

## 2024-05-27 DIAGNOSIS — M67472 Ganglion, left ankle and foot: Secondary | ICD-10-CM

## 2024-05-27 DIAGNOSIS — L97522 Non-pressure chronic ulcer of other part of left foot with fat layer exposed: Secondary | ICD-10-CM

## 2024-05-27 DIAGNOSIS — B952 Enterococcus as the cause of diseases classified elsewhere: Secondary | ICD-10-CM | POA: Diagnosis not present

## 2024-05-27 DIAGNOSIS — E08621 Diabetes mellitus due to underlying condition with foot ulcer: Secondary | ICD-10-CM

## 2024-05-27 DIAGNOSIS — M21622 Bunionette of left foot: Secondary | ICD-10-CM

## 2024-05-27 DIAGNOSIS — L080 Pyoderma: Secondary | ICD-10-CM | POA: Diagnosis not present

## 2024-05-27 DIAGNOSIS — L97525 Non-pressure chronic ulcer of other part of left foot with muscle involvement without evidence of necrosis: Secondary | ICD-10-CM

## 2024-05-27 MED ORDER — DOXYCYCLINE HYCLATE 100 MG PO TABS: 100.0000 mg | ORAL_TABLET | Freq: Two times a day (BID) | ORAL | 0 refills | Status: DC
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
## 2024-05-27 NOTE — Addendum Note (Signed)
 Addended by: GIB ANTES R on: 05/27/2024 11:40 AM   Modules accepted: Orders

## 2024-05-28 DIAGNOSIS — Z992 Dependence on renal dialysis: Secondary | ICD-10-CM | POA: Diagnosis not present

## 2024-05-28 DIAGNOSIS — N2581 Secondary hyperparathyroidism of renal origin: Secondary | ICD-10-CM | POA: Diagnosis not present

## 2024-05-28 DIAGNOSIS — N186 End stage renal disease: Secondary | ICD-10-CM | POA: Diagnosis not present

## 2024-05-31 DIAGNOSIS — L299 Pruritus, unspecified: Secondary | ICD-10-CM | POA: Diagnosis not present

## 2024-05-31 DIAGNOSIS — N186 End stage renal disease: Secondary | ICD-10-CM | POA: Diagnosis not present

## 2024-05-31 DIAGNOSIS — N2581 Secondary hyperparathyroidism of renal origin: Secondary | ICD-10-CM | POA: Diagnosis not present

## 2024-05-31 DIAGNOSIS — Z992 Dependence on renal dialysis: Secondary | ICD-10-CM | POA: Diagnosis not present

## 2024-06-01 DIAGNOSIS — H35033 Hypertensive retinopathy, bilateral: Secondary | ICD-10-CM | POA: Diagnosis not present

## 2024-06-01 DIAGNOSIS — H2513 Age-related nuclear cataract, bilateral: Secondary | ICD-10-CM | POA: Diagnosis not present

## 2024-06-01 DIAGNOSIS — H43823 Vitreomacular adhesion, bilateral: Secondary | ICD-10-CM | POA: Diagnosis not present

## 2024-06-01 DIAGNOSIS — H3582 Retinal ischemia: Secondary | ICD-10-CM | POA: Diagnosis not present

## 2024-06-01 DIAGNOSIS — E113513 Type 2 diabetes mellitus with proliferative diabetic retinopathy with macular edema, bilateral: Secondary | ICD-10-CM | POA: Diagnosis not present

## 2024-06-02 DIAGNOSIS — Z992 Dependence on renal dialysis: Secondary | ICD-10-CM | POA: Diagnosis not present

## 2024-06-02 DIAGNOSIS — N186 End stage renal disease: Secondary | ICD-10-CM | POA: Diagnosis not present

## 2024-06-02 DIAGNOSIS — L299 Pruritus, unspecified: Secondary | ICD-10-CM | POA: Diagnosis not present

## 2024-06-02 DIAGNOSIS — N2581 Secondary hyperparathyroidism of renal origin: Secondary | ICD-10-CM | POA: Diagnosis not present

## 2024-06-03 ENCOUNTER — Ambulatory Visit

## 2024-06-03 DIAGNOSIS — L97522 Non-pressure chronic ulcer of other part of left foot with fat layer exposed: Secondary | ICD-10-CM | POA: Diagnosis not present

## 2024-06-03 DIAGNOSIS — E08621 Diabetes mellitus due to underlying condition with foot ulcer: Secondary | ICD-10-CM | POA: Diagnosis not present

## 2024-06-03 DIAGNOSIS — L97525 Non-pressure chronic ulcer of other part of left foot with muscle involvement without evidence of necrosis: Secondary | ICD-10-CM

## 2024-06-03 DIAGNOSIS — M21622 Bunionette of left foot: Secondary | ICD-10-CM
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
## 2024-06-04 DIAGNOSIS — I129 Hypertensive chronic kidney disease with stage 1 through stage 4 chronic kidney disease, or unspecified chronic kidney disease: Secondary | ICD-10-CM | POA: Diagnosis not present

## 2024-06-04 DIAGNOSIS — N186 End stage renal disease: Secondary | ICD-10-CM | POA: Diagnosis not present

## 2024-06-04 DIAGNOSIS — Z992 Dependence on renal dialysis: Secondary | ICD-10-CM | POA: Diagnosis not present

## 2024-06-04 DIAGNOSIS — N2581 Secondary hyperparathyroidism of renal origin: Secondary | ICD-10-CM | POA: Diagnosis not present

## 2024-06-04 DIAGNOSIS — L299 Pruritus, unspecified: Secondary | ICD-10-CM | POA: Diagnosis not present

## 2024-06-07 DIAGNOSIS — Z992 Dependence on renal dialysis: Secondary | ICD-10-CM | POA: Diagnosis not present

## 2024-06-07 DIAGNOSIS — N2581 Secondary hyperparathyroidism of renal origin: Secondary | ICD-10-CM | POA: Diagnosis not present

## 2024-06-07 DIAGNOSIS — N186 End stage renal disease: Secondary | ICD-10-CM | POA: Diagnosis not present

## 2024-06-09 DIAGNOSIS — N2581 Secondary hyperparathyroidism of renal origin: Secondary | ICD-10-CM | POA: Diagnosis not present

## 2024-06-09 DIAGNOSIS — Z992 Dependence on renal dialysis: Secondary | ICD-10-CM | POA: Diagnosis not present

## 2024-06-09 DIAGNOSIS — N186 End stage renal disease: Secondary | ICD-10-CM | POA: Diagnosis not present

## 2024-06-11 DIAGNOSIS — N2581 Secondary hyperparathyroidism of renal origin: Secondary | ICD-10-CM | POA: Diagnosis not present

## 2024-06-11 DIAGNOSIS — N186 End stage renal disease: Secondary | ICD-10-CM | POA: Diagnosis not present

## 2024-06-11 DIAGNOSIS — Z992 Dependence on renal dialysis: Secondary | ICD-10-CM | POA: Diagnosis not present

## 2024-06-14 DIAGNOSIS — N2581 Secondary hyperparathyroidism of renal origin: Secondary | ICD-10-CM | POA: Diagnosis not present

## 2024-06-14 DIAGNOSIS — N186 End stage renal disease: Secondary | ICD-10-CM | POA: Diagnosis not present

## 2024-06-14 DIAGNOSIS — Z992 Dependence on renal dialysis: Secondary | ICD-10-CM | POA: Diagnosis not present

## 2024-06-15 ENCOUNTER — Ambulatory Visit

## 2024-06-15 DIAGNOSIS — M21622 Bunionette of left foot: Secondary | ICD-10-CM

## 2024-06-15 DIAGNOSIS — L97522 Non-pressure chronic ulcer of other part of left foot with fat layer exposed: Secondary | ICD-10-CM

## 2024-06-15 DIAGNOSIS — L97525 Non-pressure chronic ulcer of other part of left foot with muscle involvement without evidence of necrosis: Secondary | ICD-10-CM

## 2024-06-15 DIAGNOSIS — E08621 Diabetes mellitus due to underlying condition with foot ulcer: Secondary | ICD-10-CM | POA: Diagnosis not present

## 2024-06-15 DIAGNOSIS — L02612 Cutaneous abscess of left foot: Secondary | ICD-10-CM

## 2024-06-16 DIAGNOSIS — N186 End stage renal disease: Secondary | ICD-10-CM | POA: Diagnosis not present

## 2024-06-16 DIAGNOSIS — Z992 Dependence on renal dialysis: Secondary | ICD-10-CM | POA: Diagnosis not present

## 2024-06-16 DIAGNOSIS — N2581 Secondary hyperparathyroidism of renal origin: Secondary | ICD-10-CM | POA: Diagnosis not present
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
## 2024-06-18 DIAGNOSIS — Z992 Dependence on renal dialysis: Secondary | ICD-10-CM | POA: Diagnosis not present

## 2024-06-18 DIAGNOSIS — N186 End stage renal disease: Secondary | ICD-10-CM | POA: Diagnosis not present

## 2024-06-18 DIAGNOSIS — N2581 Secondary hyperparathyroidism of renal origin: Secondary | ICD-10-CM | POA: Diagnosis not present

## 2024-06-21 DIAGNOSIS — Z992 Dependence on renal dialysis: Secondary | ICD-10-CM | POA: Diagnosis not present

## 2024-06-21 DIAGNOSIS — N2581 Secondary hyperparathyroidism of renal origin: Secondary | ICD-10-CM | POA: Diagnosis not present

## 2024-06-21 DIAGNOSIS — N186 End stage renal disease: Secondary | ICD-10-CM | POA: Diagnosis not present

## 2024-06-22 NOTE — Addendum Note (Signed)
 Addended by: MAGDALEN BARTER on: 06/22/2024 12:58 PM   Modules accepted: Orders

## 2024-06-23 ENCOUNTER — Telehealth: Payer: Self-pay

## 2024-06-23 DIAGNOSIS — N186 End stage renal disease: Secondary | ICD-10-CM | POA: Diagnosis not present

## 2024-06-23 DIAGNOSIS — N2581 Secondary hyperparathyroidism of renal origin: Secondary | ICD-10-CM | POA: Diagnosis not present

## 2024-06-23 DIAGNOSIS — Z992 Dependence on renal dialysis: Secondary | ICD-10-CM | POA: Diagnosis not present

## 2024-06-23 NOTE — Telephone Encounter (Signed)
 Ptient is scheduled for MRI tomorrow and PA is pending. You may call [employer] to do a P2P at [phone] and referencin case ID [id number].

## 2024-06-24 ENCOUNTER — Ambulatory Visit (HOSPITAL_COMMUNITY)
Admission: RE | Admit: 2024-06-24 | Discharge: 2024-06-24 | Disposition: A | Discharge status: Home / Self Care | Source: Ambulatory Visit

## 2024-06-24 DIAGNOSIS — E08621 Diabetes mellitus due to underlying condition with foot ulcer: Secondary | ICD-10-CM

## 2024-06-24 DIAGNOSIS — L02612 Cutaneous abscess of left foot: Secondary | ICD-10-CM

## 2024-06-24 NOTE — Progress Notes (Addendum)
  Device system confirmed to be MRI conditional, with implant date > 6 weeks ago, and no evidence of abandoned or epicardial leads in review of most recent CXR  Device last cleared by EP Provider: Charlies Arthur 06/24/2024  Clearance is good through for 1 year as long as parameters remain stable at time of check. If pt undergoes a cardiac device procedure during that time, they should be re-cleared.   Tachy-therapies to be programmed off if applicable with device back to pre-MRI settings after completion of exam.  [employer] - Programming recommendation received through [employer] App/Tablet  Suzan Recardo Arna Debby  06/24/2024 8:08 AM

## 2024-06-25 DIAGNOSIS — N2581 Secondary hyperparathyroidism of renal origin: Secondary | ICD-10-CM | POA: Diagnosis not present

## 2024-06-25 DIAGNOSIS — Z992 Dependence on renal dialysis: Secondary | ICD-10-CM | POA: Diagnosis not present

## 2024-06-25 DIAGNOSIS — N186 End stage renal disease: Secondary | ICD-10-CM | POA: Diagnosis not present

## 2024-06-27 DIAGNOSIS — Z992 Dependence on renal dialysis: Secondary | ICD-10-CM | POA: Diagnosis not present

## 2024-06-27 DIAGNOSIS — N2581 Secondary hyperparathyroidism of renal origin: Secondary | ICD-10-CM | POA: Diagnosis not present

## 2024-06-27 DIAGNOSIS — N186 End stage renal disease: Secondary | ICD-10-CM | POA: Diagnosis not present

## 2024-06-29 ENCOUNTER — Ambulatory Visit

## 2024-06-29 DIAGNOSIS — Z992 Dependence on renal dialysis: Secondary | ICD-10-CM | POA: Diagnosis not present

## 2024-06-29 DIAGNOSIS — N2581 Secondary hyperparathyroidism of renal origin: Secondary | ICD-10-CM | POA: Diagnosis not present

## 2024-06-29 DIAGNOSIS — N186 End stage renal disease: Secondary | ICD-10-CM | POA: Diagnosis not present

## 2024-07-02 DIAGNOSIS — N2581 Secondary hyperparathyroidism of renal origin: Secondary | ICD-10-CM | POA: Diagnosis not present

## 2024-07-02 DIAGNOSIS — N186 End stage renal disease: Secondary | ICD-10-CM | POA: Diagnosis not present

## 2024-07-02 DIAGNOSIS — Z992 Dependence on renal dialysis: Secondary | ICD-10-CM | POA: Diagnosis not present

## 2024-07-04 DIAGNOSIS — N186 End stage renal disease: Secondary | ICD-10-CM | POA: Diagnosis not present

## 2024-07-04 DIAGNOSIS — Z992 Dependence on renal dialysis: Secondary | ICD-10-CM | POA: Diagnosis not present

## 2024-07-04 DIAGNOSIS — I129 Hypertensive chronic kidney disease with stage 1 through stage 4 chronic kidney disease, or unspecified chronic kidney disease: Secondary | ICD-10-CM | POA: Diagnosis not present

## 2024-07-05 DIAGNOSIS — Z992 Dependence on renal dialysis: Secondary | ICD-10-CM | POA: Diagnosis not present

## 2024-07-05 DIAGNOSIS — N2581 Secondary hyperparathyroidism of renal origin: Secondary | ICD-10-CM | POA: Diagnosis not present

## 2024-07-05 DIAGNOSIS — N186 End stage renal disease: Secondary | ICD-10-CM | POA: Diagnosis not present

## 2024-07-06 DIAGNOSIS — F419 Anxiety disorder, unspecified: Secondary | ICD-10-CM | POA: Diagnosis not present

## 2024-07-06 DIAGNOSIS — K3184 Gastroparesis: Secondary | ICD-10-CM | POA: Diagnosis not present

## 2024-07-06 DIAGNOSIS — Z23 Encounter for immunization: Secondary | ICD-10-CM | POA: Diagnosis not present

## 2024-07-06 DIAGNOSIS — I10 Essential (primary) hypertension: Secondary | ICD-10-CM | POA: Diagnosis not present

## 2024-07-06 DIAGNOSIS — E1140 Type 2 diabetes mellitus with diabetic neuropathy, unspecified: Secondary | ICD-10-CM | POA: Diagnosis not present

## 2024-07-07 DIAGNOSIS — N186 End stage renal disease: Secondary | ICD-10-CM | POA: Diagnosis not present

## 2024-07-07 DIAGNOSIS — N2581 Secondary hyperparathyroidism of renal origin: Secondary | ICD-10-CM | POA: Diagnosis not present

## 2024-07-07 DIAGNOSIS — Z992 Dependence on renal dialysis: Secondary | ICD-10-CM | POA: Diagnosis not present

## 2024-07-08 ENCOUNTER — Ambulatory Visit

## 2024-07-08 ENCOUNTER — Ambulatory Visit: Payer: Self-pay | Admitting: Cardiovascular Disease

## 2024-07-08 ENCOUNTER — Ambulatory Visit (HOSPITAL_COMMUNITY): Admission: RE | Admit: 2024-07-08 | Discharge: 2024-07-08

## 2024-07-08 DIAGNOSIS — I5042 Chronic combined systolic (congestive) and diastolic (congestive) heart failure: Secondary | ICD-10-CM

## 2024-07-08 DIAGNOSIS — L97525 Non-pressure chronic ulcer of other part of left foot with muscle involvement without evidence of necrosis: Secondary | ICD-10-CM | POA: Diagnosis not present

## 2024-07-08 DIAGNOSIS — L02612 Cutaneous abscess of left foot: Secondary | ICD-10-CM | POA: Insufficient documentation

## 2024-07-08 DIAGNOSIS — E08621 Diabetes mellitus due to underlying condition with foot ulcer: Secondary | ICD-10-CM | POA: Insufficient documentation

## 2024-07-08 DIAGNOSIS — E049 Nontoxic goiter, unspecified: Secondary | ICD-10-CM | POA: Diagnosis not present

## 2024-07-08 DIAGNOSIS — E041 Nontoxic single thyroid nodule: Secondary | ICD-10-CM | POA: Diagnosis not present

## 2024-07-08 DIAGNOSIS — D509 Iron deficiency anemia, unspecified: Secondary | ICD-10-CM | POA: Diagnosis not present

## 2024-07-08 DIAGNOSIS — R600 Localized edema: Secondary | ICD-10-CM | POA: Diagnosis not present

## 2024-07-08 DIAGNOSIS — E1140 Type 2 diabetes mellitus with diabetic neuropathy, unspecified: Secondary | ICD-10-CM | POA: Diagnosis not present

## 2024-07-08 DIAGNOSIS — Z794 Long term (current) use of insulin: Secondary | ICD-10-CM | POA: Diagnosis not present

## 2024-07-08 DIAGNOSIS — E11621 Type 2 diabetes mellitus with foot ulcer: Secondary | ICD-10-CM | POA: Diagnosis not present

## 2024-07-08 DIAGNOSIS — L97529 Non-pressure chronic ulcer of other part of left foot with unspecified severity: Secondary | ICD-10-CM | POA: Diagnosis not present

## 2024-07-08 LAB — CUP PACEART REMOTE DEVICE CHECK
Battery Remaining Longevity: 117 mo
Battery Voltage: 3.02 V
Brady Statistic RV Percent Paced: 0 %
Date Time Interrogation Session: 20251204031604
HighPow Impedance: 62 Ohm
Implantable Lead Connection Status: 753985
Implantable Lead Implant Date: 20230905
Implantable Lead Location: 753860
Implantable Pulse Generator Implant Date: 20230905
Lead Channel Impedance Value: 285 Ohm
Lead Channel Impedance Value: 342 Ohm
Lead Channel Pacing Threshold Amplitude: 0.75 V
Lead Channel Pacing Threshold Pulse Width: 0.4 ms
Lead Channel Sensing Intrinsic Amplitude: 5.125 mV
Lead Channel Sensing Intrinsic Amplitude: 5.125 mV
Lead Channel Setting Pacing Amplitude: 2 V
Lead Channel Setting Pacing Pulse Width: 0.4 ms
Lead Channel Setting Sensing Sensitivity: 0.3 mV
Zone Setting Status: 755011
Zone Setting Status: 755011

## 2024-07-08 MED ORDER — GADOBUTROL 1 MMOL/ML IV SOLN
8.0000 mL | Freq: Once | INTRAVENOUS | Status: AC | PRN
  Administered 2024-07-08: 8 mL via INTRAVENOUS

## 2024-07-08 NOTE — CV Procedure (Signed)
  Device system confirmed to be MRI conditional, with implant date > 6 weeks ago, and no evidence of abandoned or epicardial leads in review of most recent CXR  Device last cleared by EP Provider: Charlies Arthur 07/07/24  Clearance is good through for 1 year as long as parameters remain stable at time of check. If pt undergoes a cardiac device procedure during that time, they should be re-cleared.   Tachy-therapies to be programmed off if applicable with device back to pre-MRI settings after completion of exam.  [employer] - Programming recommendation received through [employer] App/Tablet  Rocky Catalan, RT  07/08/2024 7:40 AM

## 2024-07-08 NOTE — Progress Notes (Signed)
 Patient was monitored by this RN during MRI scan due to presence of a pacemaker. Cardiac rhythm was continuously monitored throughout the procedure. Prior to the start of the scan, the pacemaker was placed in MRI-safe mode by the MRI technician and/or pacemaker representative. Following the completion of the scan, the device was returned to its pre-MRI settings. Neurological status and orientation post-procedure were unchanged from baseline.   Pre-procedure Heart Rate (Prior to being placed in MRI safe mode): 98  Post-procedure Heart Rate (Once pacemaker is returned to baseline mode): 95

## 2024-07-09 DIAGNOSIS — Z992 Dependence on renal dialysis: Secondary | ICD-10-CM | POA: Diagnosis not present

## 2024-07-09 DIAGNOSIS — N186 End stage renal disease: Secondary | ICD-10-CM | POA: Diagnosis not present

## 2024-07-09 DIAGNOSIS — N2581 Secondary hyperparathyroidism of renal origin: Secondary | ICD-10-CM | POA: Diagnosis not present

## 2024-07-09 NOTE — Progress Notes (Signed)
 Remote ICD Transmission

## 2024-07-12 DIAGNOSIS — Z992 Dependence on renal dialysis: Secondary | ICD-10-CM | POA: Diagnosis not present

## 2024-07-12 DIAGNOSIS — N2581 Secondary hyperparathyroidism of renal origin: Secondary | ICD-10-CM | POA: Diagnosis not present

## 2024-07-12 DIAGNOSIS — N186 End stage renal disease: Secondary | ICD-10-CM | POA: Diagnosis not present

## 2024-07-14 ENCOUNTER — Telehealth (HOSPITAL_COMMUNITY): Payer: Self-pay

## 2024-07-14 DIAGNOSIS — Z992 Dependence on renal dialysis: Secondary | ICD-10-CM | POA: Diagnosis not present

## 2024-07-14 DIAGNOSIS — N186 End stage renal disease: Secondary | ICD-10-CM | POA: Diagnosis not present

## 2024-07-14 DIAGNOSIS — N2581 Secondary hyperparathyroidism of renal origin: Secondary | ICD-10-CM | POA: Diagnosis not present

## 2024-07-14 NOTE — Telephone Encounter (Signed)
 Paper work faxed to [employer]. My chart message sent to patient to inform her.

## 2024-07-16 DIAGNOSIS — N2581 Secondary hyperparathyroidism of renal origin: Secondary | ICD-10-CM | POA: Diagnosis not present

## 2024-07-16 DIAGNOSIS — Z992 Dependence on renal dialysis: Secondary | ICD-10-CM | POA: Diagnosis not present

## 2024-07-16 DIAGNOSIS — N186 End stage renal disease: Secondary | ICD-10-CM | POA: Diagnosis not present

## 2024-07-20 ENCOUNTER — Ambulatory Visit

## 2024-07-20 ENCOUNTER — Encounter: Admitting: Nutrition

## 2024-07-20 DIAGNOSIS — N186 End stage renal disease: Secondary | ICD-10-CM | POA: Diagnosis not present

## 2024-07-20 DIAGNOSIS — M21622 Bunionette of left foot: Secondary | ICD-10-CM | POA: Diagnosis not present

## 2024-07-20 DIAGNOSIS — Z992 Dependence on renal dialysis: Secondary | ICD-10-CM | POA: Diagnosis not present

## 2024-07-20 DIAGNOSIS — E08621 Diabetes mellitus due to underlying condition with foot ulcer: Secondary | ICD-10-CM | POA: Diagnosis not present

## 2024-07-20 DIAGNOSIS — L97525 Non-pressure chronic ulcer of other part of left foot with muscle involvement without evidence of necrosis: Secondary | ICD-10-CM | POA: Diagnosis not present
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
## 2024-07-22 ENCOUNTER — Telehealth: Payer: Self-pay

## 2024-07-22 NOTE — Telephone Encounter (Signed)
 Called and left message for patient to call office to schedule surgery. Per Dr.Regal if it's going to be a few weeks we need to get patient in for a biopsy next week 12/22-24. MyChart message sent.

## 2024-07-23 ENCOUNTER — Telehealth: Payer: Self-pay

## 2024-07-23 NOTE — Telephone Encounter (Signed)
 Called and scheduled patient for surgery for 08/02/2024. Patient only on blood thinner-heparin  when doing dialysis and Dr. Magdalen aware. Not on GLP1 medications. Patient pharmacy correct in chart.

## 2024-07-26 ENCOUNTER — Ambulatory Visit

## 2024-07-26 DIAGNOSIS — L97522 Non-pressure chronic ulcer of other part of left foot with fat layer exposed: Secondary | ICD-10-CM

## 2024-07-26 DIAGNOSIS — E08621 Diabetes mellitus due to underlying condition with foot ulcer: Secondary | ICD-10-CM

## 2024-07-26 DIAGNOSIS — L97525 Non-pressure chronic ulcer of other part of left foot with muscle involvement without evidence of necrosis: Secondary | ICD-10-CM | POA: Diagnosis not present

## 2024-07-26 DIAGNOSIS — M21622 Bunionette of left foot: Secondary | ICD-10-CM

## 2024-07-26 DIAGNOSIS — L97509 Non-pressure chronic ulcer of other part of unspecified foot with unspecified severity: Secondary | ICD-10-CM | POA: Diagnosis not present

## 2024-07-26 NOTE — Addendum Note (Signed)
 Addended by: GIB MABLE SAUNDERS on: 07/26/2024 05:03 PM   Modules accepted: Orders
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
## 2024-07-27 ENCOUNTER — Telehealth: Payer: Self-pay

## 2024-07-27 NOTE — Telephone Encounter (Signed)
 DOS- 08/02/2024  5TH METATARSAL HEAD RES LT- 71886  [employer] EFFECTIVE DATE- 02/03/2024  DEDUCTIBLE- $2000 REMAINING- $0 OOP- $4000 REMAINING- $0 COINSURANCE- 20%  PER [employer] EFFECTIVE DATE, PRIOR AUTH IS NOT REQUIRED FOR CPT CODE 71886.

## 2024-07-28 ENCOUNTER — Telehealth: Payer: Self-pay

## 2024-07-28 ENCOUNTER — Other Ambulatory Visit: Payer: Self-pay

## 2024-07-28 ENCOUNTER — Encounter (HOSPITAL_COMMUNITY): Payer: Self-pay

## 2024-07-28 ENCOUNTER — Encounter: Payer: Self-pay | Admitting: Cardiovascular Disease

## 2024-07-28 NOTE — Pre-Procedure Instructions (Addendum)
 SDW CALL  Patient was given pre-op  instructions over the phone. The opportunity was given for the patient to ask questions. No further questions asked. Patient verbalized understanding of instructions given.   PCP - Dwight Trula SQUIBB, MD  Cardiologist -   Santo Stanly LABOR, MD      EP- Dr. Eulas Furbish Endocrine- Dr. Reyes alexander  PPM/ICD - ICD- Medtronic- message sent to device clinic for orders, Rep emailed to notify of patient surgery date/time and awaiting orders.    Chest x-ray -  N/A EKG - 11/13/23 Stress Test - 06/02/23 ECHO - 06/16/23 Cardiac Cath -   Sleep Study - denies   Fasting Blood Sugar - Patient reports she wears CGM to right arm. She has an insulin  pump and will reach out to her endocrinologist for instructions/reducing basal rate.    Blood Thinner Instructions: N/A Aspirin  Instructions:N/A  ERAS Protcol - ERAS per protocol- no orders   COVID TEST-  n/A   Anesthesia review: yes- heart history and ICD pt, type 1 Diabetic HD MWF but holiday schedule currently has her Sun, Tue, Fri, so she will go to HD the day before surgery. Patient is getting dialysis through graft in her left thigh currently. Pt denies any recent cardiac symptoms or illness.   I spoke to DM coordinator to notify that patient is coming on DOS. Pt was instructed to call her endocrinologist for instructions. Pt was given instructions on her Tresiba  PRN if her pump is not working. Patient informed to reach back out with further questions, or if she is unable to reach her endocrinologist. Diabetic coordinator is available for help with further instructions if needed prior to surgery.   Patient denies shortness of breath, fever, cough and chest pain over the phone call    Surgical Instructions    Your procedure is scheduled on August 02, 2024  Report to [facility] Main Entrance A at 11:00 A.M., then check in with the Admitting office.  Call this number if you have problems the  morning of surgery:  [phone]    Remember:  Do not eat after midnight the night before your surgery  You may drink clear liquids until 10:30AM the morning of your surgery.   Clear liquids allowed are: Water , Non-Citrus Juices (without pulp), Carbonated Beverages, Clear Tea, Black Coffee ONLY (no honey, NO MILK, CREAM OR POWDERED CREAMER of any kind), and Gatorade   Take these medicines the morning of surgery with A SIP OF WATER :   Carvedilol  Prozac Protonix  Scopolamin patch Eye drops If needed: tylenol , albuterol  (bring inhaler), hydroxyzine , ativan , reglan , compazine   As of today, STOP taking any Aspirin  (unless otherwise instructed by your surgeon) Aleve, Naproxen, Ibuprofen, Motrin, Advil, Goody's, BC's, all herbal medications, fish oil, and no non prescription vitamins.  WHAT DO I DO ABOUT MY DIABETES MEDICATION?   Insulin  Pump- Contact your prescribing provider for instructions regarding your insulin  pump, OR reduce all basal rates by 20% at midnight the night before surgery.   Tresiba - Take 80% of dose if needed PM before/AM of surgery. (Patient takes this if pump is not working, on a sliding scale)    Check your blood sugar the morning of your surgery when you wake up and every 2 hours until you get to the Short Stay unit.  If your blood sugar is less than 70 mg/dL, you will need to treat for low blood sugar: Do not take insulin . Treat a low blood sugar (less than 70 mg/dL) with  cup  of clear juice (cranberry or apple), 4 glucose tablets, OR glucose gel. Recheck blood sugar in 15 minutes after treatment (to make sure it is greater than 70 mg/dL). If your blood sugar is not greater than 70 mg/dL on recheck, call [phone] for further instructions.  [facility] is not responsible for any belongings or valuables. .   Contacts, glasses, hearing aids, dentures or partials may not be worn into surgery, please bring cases for these belongings   Patients discharged the  day of surgery will not be allowed to drive home, and someone needs to stay with them for 24 hours.   SURGICAL WAITING ROOM VISITATION You may have 1 visitor in the pre-op  area at a time determined by the pre-op  nurse. (Visitor may not switch out) Patients having surgery or a procedure in a hospital may have two support people in the waiting room. Children under the age of 43 must have an adult with them who is not the patient.    Day of Surgery:  Take a shower the day of or night before with antibacterial soap. Wear Clean/Comfortable clothing the morning of surgery Do not apply any deodorants/lotions.   Do not wear jewelry or makeup Do not wear lotions, powders, perfumes/colognes, or deodorant. Do not shave 48 hours prior to surgery. Do not bring valuables to the hospital. Do not wear nail polish, gel polish, artificial nails, or any other type of covering on natural nails (fingers and toes) brush your teeth WITH YOUR REGULAR TOOTHPASTE.

## 2024-07-28 NOTE — Progress Notes (Signed)
 PERIOPERATIVE PRESCRIPTION FOR IMPLANTED CARDIAC DEVICE PROGRAMMING  Patient Information: Name:  [name]  DOB:  [birth year]  MRN:  [id number]   Planned Procedure:  EXCISION, METATARSAL BONE, HEAD - Left  Surgeon:  Dr Prentice Ovens  Date of Procedure:  08/02/24  Cautery will be used.  Position during surgery:  supine   Device Information:  Clinic EP Physician: Eulas Furbish, MD  Device Type:  Defibrillator Manufacturer and Phone #:  Medtronic: [phone] Pacemaker Dependent?:  No. Date of Last Device Check:  07/08/2024  Normal Device Function?:  Yes.    Electrophysiologist's Recommendations:  Have magnet available. Provide continuous ECG monitoring when magnet is used or reprogramming is to be performed.  Procedure should not interfere with device function.  No device programming or magnet placement needed.  Per Device Clinic [address] [name], [place]  8:09 AM 07/28/2024

## 2024-07-28 NOTE — Telephone Encounter (Signed)
 Called patient to inform her surgery would have to be postponed until after her ECHO scheduled for 08/09/2024. We were denied medical clearance from PCP as they are saying patient needs cardiac and endocrinology clearance before they will clear her medically. Patient is asking if we could do her surgery on a Tuesday as she has dialysis M,W,F. Can we accommodate and do we want to still proceed with surgery at the Hospital or try [facility] again once we receive clearance? Please advise, thanks!

## 2024-08-02 ENCOUNTER — Ambulatory Visit (HOSPITAL_COMMUNITY): Admission: RE | Admit: 2024-08-02 | Source: Home / Self Care

## 2024-08-02 ENCOUNTER — Ambulatory Visit: Payer: Self-pay

## 2024-08-02 ENCOUNTER — Encounter (HOSPITAL_COMMUNITY): Admission: RE | Payer: Self-pay | Source: Home / Self Care

## 2024-08-02 HISTORY — DX: Gastro-esophageal reflux disease without esophagitis: K21.9

## 2024-08-02 SURGERY — EXCISION, METATARSAL BONE, HEAD
Anesthesia: IV Sedation (MBSC Only) | Site: Toe | Laterality: Left

## 2024-08-06 ENCOUNTER — Encounter

## 2024-08-09 ENCOUNTER — Ambulatory Visit (HOSPITAL_COMMUNITY)
Admission: RE | Admit: 2024-08-09 | Discharge: 2024-08-09 | Disposition: A | Discharge status: Home / Self Care | Source: Ambulatory Visit | Attending: Cardiology | Admitting: Cardiology

## 2024-08-09 VITALS — BP 136/66 | HR 95 | Wt 190.6 lb

## 2024-08-09 DIAGNOSIS — Z992 Dependence on renal dialysis: Secondary | ICD-10-CM | POA: Diagnosis not present

## 2024-08-09 DIAGNOSIS — I132 Hypertensive heart and chronic kidney disease with heart failure and with stage 5 chronic kidney disease, or end stage renal disease: Secondary | ICD-10-CM | POA: Diagnosis not present

## 2024-08-09 DIAGNOSIS — E1022 Type 1 diabetes mellitus with diabetic chronic kidney disease: Secondary | ICD-10-CM | POA: Diagnosis not present

## 2024-08-09 DIAGNOSIS — R5383 Other fatigue: Secondary | ICD-10-CM | POA: Diagnosis not present

## 2024-08-09 DIAGNOSIS — Z95810 Presence of automatic (implantable) cardiac defibrillator: Secondary | ICD-10-CM | POA: Diagnosis not present

## 2024-08-09 DIAGNOSIS — I5022 Chronic systolic (congestive) heart failure: Secondary | ICD-10-CM | POA: Diagnosis present

## 2024-08-09 DIAGNOSIS — N186 End stage renal disease: Secondary | ICD-10-CM | POA: Diagnosis not present

## 2024-08-09 DIAGNOSIS — R0602 Shortness of breath: Secondary | ICD-10-CM | POA: Diagnosis not present

## 2024-08-09 DIAGNOSIS — K3189 Other diseases of stomach and duodenum: Secondary | ICD-10-CM | POA: Diagnosis not present

## 2024-08-09 DIAGNOSIS — Z794 Long term (current) use of insulin: Secondary | ICD-10-CM | POA: Diagnosis not present

## 2024-08-09 DIAGNOSIS — Z79899 Other long term (current) drug therapy: Secondary | ICD-10-CM | POA: Diagnosis not present

## 2024-08-09 DIAGNOSIS — I5042 Chronic combined systolic (congestive) and diastolic (congestive) heart failure: Secondary | ICD-10-CM | POA: Diagnosis not present

## 2024-08-09 MED ORDER — MIDODRINE HCL 5 MG PO TABS: ORAL_TABLET | ORAL | 1 refills | Status: AC

## 2024-08-09 NOTE — Patient Instructions (Signed)
 Medication Changes:  START MIDODRINE  5MG  ONCE DAILY AS NEEDED ON DIALYSIS (BEFORE DIALYSIS)   Testing/Procedures:  ECHOCARDIOGRAM AS SCHEDULED   YOU HAVE BEEN REFERRED TO PULMONARY REHAB THEY WILL REACH OUT TO YOU OR CALL TO ARRANGE THIS. PLEASE CALL US  WITH ANY CONCERNS   Follow-Up in: 4 months with APP as scheduled   At the [facility], you and your health needs are our priority. We have a designated team specialized in the treatment of Heart Failure. This Care Team includes your primary Heart Failure Specialized Cardiologist (physician), Advanced Practice Providers (APPs- Physician Assistants and Nurse Practitioners), and Pharmacist who all work together to provide you with the care you need, when you need it.   You may see any of the following providers on your designated Care Team at your next follow up:  Dr. Toribio Fuel Dr. Ezra Shuck Dr. Odis Brownie Greig Mosses, NP Caffie Shed, [place] [facility] [place], [place] Beckey Coe, NP Jordan Lee, NP Tinnie Redman, PharmD   Please be sure to bring in all your medications bottles to every appointment.   Need to Contact Us :  If you have any questions or concerns before your next appointment please send us  a message through [place] or call our office at [phone].    TO LEAVE A MESSAGE FOR THE NURSE SELECT OPTION 2, PLEASE LEAVE A MESSAGE INCLUDING: YOUR NAME DATE OF BIRTH CALL BACK NUMBER REASON FOR CALL**this is important as we prioritize the call backs  YOU WILL RECEIVE A CALL BACK THE SAME DAY AS LONG AS YOU CALL BEFORE 4:00 PM

## 2024-08-09 NOTE — Progress Notes (Signed)
" ° °  ADVANCED HEART FAILURE FOLLOW UP CLINIC NOTE  Referring Physician: Dwight Trula SQUIBB, MD  Primary Care: Dwight Trula SQUIBB, MD Primary Cardiologist:  HPI: [name] is a 54 y.o. female who presents for follow up of chronic systolic heart failure.      Patient has a longstanding cardiac history.  She has had issues in the past with hypertension and hypertensive urgency.  Hospitalized in 09-10-21 with sudden cardiac death and now has an ICD implantation.  She was standing in line at a restaurant when she had a syncopal event.  AED was applied and suggested shock.  ICD was implanted at that time.  Patient has had multiple admissions around September 10, 2021 and 09/10/22, though this appears to have significantly improved.   She has a significant amount of GI distress including dumping syndrome, nausea and vomiting from likely gastroparesis.     SUBJECTIVE:  Doing fair, chief complaint of shortness of breath and fatigue with exertion. This is relatively stable over the past few months. She has issues tolerating dialysis, does not take her HF meds on iHD days.   PMH, current medications, allergies, social history, and family history reviewed in epic.  PHYSICAL EXAM: Vitals:   08/09/24 1545  BP: 136/66  Pulse: 95  SpO2: 100%   GENERAL: NAD, chronically ill appearing PULM:  Normal work of breathing, CTAB CARDIAC:  JVP: flat         Normal rate with regular rhythm. No murmurs, rubs or gallops.  No edema. LLE wound ABDOMEN: Soft, non-tender, non-distended. NEUROLOGIC: Patient is oriented x3 with no focal or lateralizing neurologic deficits.     DATA REVIEW  ECG: 11/13/23: NSR, normal QRS, mildly prolonged QT     ECHO: 06/2023: LVEF 35-40%, grade II DD, normal RV systolic function, severely enlarged LA, trivial MR   CATH: None   CCTA: 09/11/19, CAC score of 0, normal coronary arteries  ASSESSMENT & PLAN:  Chronic systolic heart failure: NYHA Class III on GDMT. Tolerating well, but may need to cut  back in the future if BP limiting iHD. She is not taking HF meds on iHD days.  - Continue entresto  24/26mg  BID, hold on HD days - Contiue toresmide 40mg  on non-HD days - Continue carvedilol  6.25mg  BID - Not a renal txp candidate - Repeat echo at next visit - Start pulmonary rehab after discussion with patient   ESRD: 2/2 longstanding T1DM, on iHD. 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 - Continue reglan , Qtc borderline, ICD in place  Follow up in 4 months, echo ordered  Morene Brownie, MD Advanced Heart Failure Mechanical Circulatory Support 08/10/2024 "

## 2024-08-12 ENCOUNTER — Telehealth (HOSPITAL_COMMUNITY): Payer: Self-pay

## 2024-08-12 NOTE — Telephone Encounter (Signed)
 Pt is interested in the pulmonary rehab program. Pt goes to dialysis on M,W&F and will need to do the pulmonary rehab orientation on a Tue or Thurs if possible. Will reach out to the pulmonary rehab team to see if they are able to complete her orientation on another day.

## 2024-08-12 NOTE — Telephone Encounter (Signed)
 Pt insurance is active and benefits verified through [employer] Co-pay 0, DED $2,000/$2,000 met, out of pocket $4,000/$4,000 met, co-insurance 20%. no pre-authorization required, Ralph/[employer] 08/12/2024!10:07, REF# [id number]   No visit limits for Pulmonary rehab

## 2024-08-13 ENCOUNTER — Encounter

## 2024-08-18 ENCOUNTER — Telehealth (HOSPITAL_COMMUNITY): Payer: Self-pay

## 2024-08-18 NOTE — Telephone Encounter (Signed)
 Called patient to schedule pulmonary rehab, patient stated she was in dialysis and would call us  back to get scheduled.  Pulmonary team confirmed they can do a Tues/Thurs orientation if she comes in at 8:00am, but no later.

## 2024-08-20 ENCOUNTER — Ambulatory Visit (HOSPITAL_COMMUNITY)
Admission: RE | Admit: 2024-08-20 | Discharge: 2024-08-20 | Disposition: A | Discharge status: Home / Self Care | Source: Ambulatory Visit | Attending: Cardiology | Admitting: Cardiology

## 2024-08-20 ENCOUNTER — Encounter

## 2024-08-20 DIAGNOSIS — Z95810 Presence of automatic (implantable) cardiac defibrillator: Secondary | ICD-10-CM | POA: Insufficient documentation

## 2024-08-20 DIAGNOSIS — I2510 Atherosclerotic heart disease of native coronary artery without angina pectoris: Secondary | ICD-10-CM | POA: Insufficient documentation

## 2024-08-20 DIAGNOSIS — N186 End stage renal disease: Secondary | ICD-10-CM | POA: Diagnosis not present

## 2024-08-20 DIAGNOSIS — Z8674 Personal history of sudden cardiac arrest: Secondary | ICD-10-CM | POA: Diagnosis not present

## 2024-08-20 DIAGNOSIS — I5042 Chronic combined systolic (congestive) and diastolic (congestive) heart failure: Secondary | ICD-10-CM | POA: Insufficient documentation

## 2024-08-20 DIAGNOSIS — E1122 Type 2 diabetes mellitus with diabetic chronic kidney disease: Secondary | ICD-10-CM | POA: Diagnosis not present

## 2024-08-20 DIAGNOSIS — E785 Hyperlipidemia, unspecified: Secondary | ICD-10-CM | POA: Insufficient documentation

## 2024-08-20 LAB — ECHOCARDIOGRAM COMPLETE
Area-P 1/2: 4.35 cm2
Calc EF: 40.6 %
S' Lateral: 3.85 cm
Single Plane A2C EF: 38.9 %
Single Plane A4C EF: 40.9 %

## 2024-08-20 NOTE — Progress Notes (Signed)
" °  Echocardiogram 2D Echocardiogram has been performed.  [name] 08/20/2024, 3:53 PM "

## 2024-09-02 ENCOUNTER — Telehealth: Payer: Self-pay

## 2024-09-02 ENCOUNTER — Telehealth (HOSPITAL_COMMUNITY): Payer: Self-pay

## 2024-09-02 DIAGNOSIS — I5042 Chronic combined systolic (congestive) and diastolic (congestive) heart failure: Secondary | ICD-10-CM

## 2024-09-02 NOTE — Telephone Encounter (Signed)
 F/u call regarding scheduling pulmonary rehab. Patient states she has no transportation for Tuesdays and Thursdays and will not be able to attend. Informed patient if anything changes she can call us  back.  Closing referral.

## 2024-09-03 ENCOUNTER — Telehealth: Payer: Self-pay

## 2024-09-03 NOTE — Progress Notes (Unsigned)
 Complex Care Management Note Care Guide Note  09/03/2024 Name: Ramsha Lonigro MRN: [id number] DOB: [birth year]   Complex Care Management Outreach Attempts: An unsuccessful telephone outreach was attempted today to offer the patient information about available complex care management services.  Follow Up Plan:  Additional outreach attempts will be made to offer the patient complex care management information and services.   Encounter Outcome:  No Answer-Left voicemail  Leotis Rase [facility], [facility] Guide  Direct Dial : [phone]  Fax [phone]

## 2024-09-05 ENCOUNTER — Encounter (HOSPITAL_COMMUNITY): Payer: Self-pay

## 2024-09-05 ENCOUNTER — Observation Stay (HOSPITAL_COMMUNITY)
Admission: EM | Admit: 2024-09-05 | Discharge: 2024-09-07 | Disposition: A | Attending: Internal Medicine | Admitting: Internal Medicine

## 2024-09-05 ENCOUNTER — Emergency Department (HOSPITAL_COMMUNITY)

## 2024-09-05 ENCOUNTER — Other Ambulatory Visit: Payer: Self-pay

## 2024-09-05 DIAGNOSIS — Z794 Long term (current) use of insulin: Secondary | ICD-10-CM | POA: Insufficient documentation

## 2024-09-05 DIAGNOSIS — Z992 Dependence on renal dialysis: Secondary | ICD-10-CM | POA: Insufficient documentation

## 2024-09-05 DIAGNOSIS — E1069 Type 1 diabetes mellitus with other specified complication: Secondary | ICD-10-CM | POA: Diagnosis present

## 2024-09-05 DIAGNOSIS — E1065 Type 1 diabetes mellitus with hyperglycemia: Secondary | ICD-10-CM | POA: Insufficient documentation

## 2024-09-05 DIAGNOSIS — K3184 Gastroparesis: Secondary | ICD-10-CM | POA: Diagnosis present

## 2024-09-05 DIAGNOSIS — D72829 Elevated white blood cell count, unspecified: Secondary | ICD-10-CM | POA: Diagnosis present

## 2024-09-05 DIAGNOSIS — E870 Hyperosmolality and hypernatremia: Secondary | ICD-10-CM | POA: Diagnosis present

## 2024-09-05 DIAGNOSIS — N186 End stage renal disease: Secondary | ICD-10-CM | POA: Insufficient documentation

## 2024-09-05 DIAGNOSIS — I160 Hypertensive urgency: Secondary | ICD-10-CM | POA: Diagnosis present

## 2024-09-05 DIAGNOSIS — Z79899 Other long term (current) drug therapy: Secondary | ICD-10-CM | POA: Insufficient documentation

## 2024-09-05 DIAGNOSIS — K2101 Gastro-esophageal reflux disease with esophagitis, with bleeding: Secondary | ICD-10-CM | POA: Insufficient documentation

## 2024-09-05 DIAGNOSIS — I132 Hypertensive heart and chronic kidney disease with heart failure and with stage 5 chronic kidney disease, or end stage renal disease: Secondary | ICD-10-CM | POA: Insufficient documentation

## 2024-09-05 DIAGNOSIS — D631 Anemia in chronic kidney disease: Secondary | ICD-10-CM | POA: Insufficient documentation

## 2024-09-05 DIAGNOSIS — R9431 Abnormal electrocardiogram [ECG] [EKG]: Secondary | ICD-10-CM | POA: Diagnosis present

## 2024-09-05 DIAGNOSIS — D638 Anemia in other chronic diseases classified elsewhere: Secondary | ICD-10-CM | POA: Diagnosis present

## 2024-09-05 DIAGNOSIS — I5032 Chronic diastolic (congestive) heart failure: Secondary | ICD-10-CM | POA: Insufficient documentation

## 2024-09-05 DIAGNOSIS — L97512 Non-pressure chronic ulcer of other part of right foot with fat layer exposed: Secondary | ICD-10-CM

## 2024-09-05 DIAGNOSIS — J9601 Acute respiratory failure with hypoxia: Secondary | ICD-10-CM | POA: Diagnosis present

## 2024-09-05 DIAGNOSIS — I4581 Long QT syndrome: Secondary | ICD-10-CM | POA: Insufficient documentation

## 2024-09-05 DIAGNOSIS — E1022 Type 1 diabetes mellitus with diabetic chronic kidney disease: Secondary | ICD-10-CM | POA: Insufficient documentation

## 2024-09-05 DIAGNOSIS — K920 Hematemesis: Principal | ICD-10-CM | POA: Diagnosis present

## 2024-09-05 LAB — CBC WITH DIFFERENTIAL/PLATELET
Abs Immature Granulocytes: 0.06 10*3/uL (ref 0.00–0.07)
Basophils Absolute: 0 10*3/uL (ref 0.0–0.1)
Basophils Relative: 0 %
Eosinophils Absolute: 0 10*3/uL (ref 0.0–0.5)
Eosinophils Relative: 0 %
HCT: 30.2 % — ABNORMAL LOW (ref 36.0–46.0)
Hemoglobin: 10.2 g/dL — ABNORMAL LOW (ref 12.0–15.0)
Immature Granulocytes: 1 %
Lymphocytes Relative: 7 %
Lymphs Abs: 0.9 10*3/uL (ref 0.7–4.0)
MCH: 30.8 pg (ref 26.0–34.0)
MCHC: 33.8 g/dL (ref 30.0–36.0)
MCV: 91.2 fL (ref 80.0–100.0)
Monocytes Absolute: 0.3 10*3/uL (ref 0.1–1.0)
Monocytes Relative: 2 %
Neutro Abs: 11.7 10*3/uL — ABNORMAL HIGH (ref 1.7–7.7)
Neutrophils Relative %: 90 %
Platelets: 155 10*3/uL (ref 150–400)
RBC: 3.31 MIL/uL — ABNORMAL LOW (ref 3.87–5.11)
RDW: 16.8 % — ABNORMAL HIGH (ref 11.5–15.5)
WBC: 13.1 10*3/uL — ABNORMAL HIGH (ref 4.0–10.5)
nRBC: 0 % (ref 0.0–0.2)

## 2024-09-05 LAB — TYPE AND SCREEN
ABO/RH(D): A POS
Antibody Screen: NEGATIVE

## 2024-09-05 LAB — GLUCOSE, CAPILLARY
Glucose-Capillary: 151 mg/dL — ABNORMAL HIGH (ref 70–99)
Glucose-Capillary: 171 mg/dL — ABNORMAL HIGH (ref 70–99)

## 2024-09-05 LAB — PROCALCITONIN: Procalcitonin: 0.41 ng/mL

## 2024-09-05 LAB — COMPREHENSIVE METABOLIC PANEL WITH GFR
ALT: <5 U/L (ref 0–44)
AST: 16 U/L (ref 15–41)
Albumin: 4 g/dL (ref 3.5–5.0)
Alkaline Phosphatase: 89 U/L (ref 38–126)
Anion gap: 17 — ABNORMAL HIGH (ref 5–15)
BUN: 51 mg/dL — ABNORMAL HIGH (ref 6–20)
CO2: 23 mmol/L (ref 22–32)
Calcium: 9.1 mg/dL (ref 8.9–10.3)
Chloride: 99 mmol/L (ref 98–111)
Creatinine, Ser: 6.51 mg/dL — ABNORMAL HIGH (ref 0.44–1.00)
GFR, Estimated: 7 mL/min — ABNORMAL LOW
Glucose, Bld: 296 mg/dL — ABNORMAL HIGH (ref 70–99)
Potassium: 3.9 mmol/L (ref 3.5–5.1)
Sodium: 139 mmol/L (ref 135–145)
Total Bilirubin: 1 mg/dL (ref 0.0–1.2)
Total Protein: 7.5 g/dL (ref 6.5–8.1)

## 2024-09-05 LAB — CBG MONITORING, ED
Glucose-Capillary: 135 mg/dL — ABNORMAL HIGH (ref 70–99)
Glucose-Capillary: 162 mg/dL — ABNORMAL HIGH (ref 70–99)

## 2024-09-05 LAB — PROTIME-INR
INR: 0.9 (ref 0.8–1.2)
Prothrombin Time: 13 s (ref 11.4–15.2)

## 2024-09-05 LAB — I-STAT CHEM 8, ED
BUN: 52 mg/dL — ABNORMAL HIGH (ref 6–20)
Calcium, Ion: 1.03 mmol/L — ABNORMAL LOW (ref 1.15–1.40)
Chloride: 100 mmol/L (ref 98–111)
Creatinine, Ser: 7.4 mg/dL — ABNORMAL HIGH (ref 0.44–1.00)
Glucose, Bld: 300 mg/dL — ABNORMAL HIGH (ref 70–99)
HCT: 33 % — ABNORMAL LOW (ref 36.0–46.0)
Hemoglobin: 11.2 g/dL — ABNORMAL LOW (ref 12.0–15.0)
Potassium: 3.7 mmol/L (ref 3.5–5.1)
Sodium: 138 mmol/L (ref 135–145)
TCO2: 24 mmol/L (ref 22–32)

## 2024-09-05 LAB — PRO BRAIN NATRIURETIC PEPTIDE: Pro Brain Natriuretic Peptide: 18706 pg/mL — ABNORMAL HIGH

## 2024-09-05 LAB — HEPATITIS B SURFACE ANTIGEN: Hepatitis B Surface Ag: NONREACTIVE

## 2024-09-05 LAB — RESP PANEL BY RT-PCR (RSV, FLU A&B, COVID)  RVPGX2
Influenza A by PCR: NEGATIVE
Influenza B by PCR: NEGATIVE
Resp Syncytial Virus by PCR: NEGATIVE
SARS Coronavirus 2 by RT PCR: NEGATIVE

## 2024-09-05 LAB — HIV ANTIBODY (ROUTINE TESTING W REFLEX): HIV Screen 4th Generation wRfx: NONREACTIVE

## 2024-09-05 LAB — HEMOGLOBIN AND HEMATOCRIT, BLOOD
HCT: 32.6 % — ABNORMAL LOW (ref 36.0–46.0)
Hemoglobin: 10.7 g/dL — ABNORMAL LOW (ref 12.0–15.0)

## 2024-09-05 MED ORDER — FLUOXETINE HCL 10 MG PO CAPS
10.0000 mg | ORAL_CAPSULE | Freq: Every day | ORAL | Status: DC
  Administered 2024-09-06 – 2024-09-07 (×2): 10 mg via ORAL
  Filled 2024-09-05 (×2): qty 1

## 2024-09-05 MED ORDER — METOPROLOL TARTRATE 5 MG/5ML IV SOLN
5.0000 mg | Freq: Once | INTRAVENOUS | Status: AC
  Administered 2024-09-05: 5 mg via INTRAVENOUS
  Filled 2024-09-05: qty 5

## 2024-09-05 MED ORDER — MUPIROCIN 2 % EX OINT
1.0000 | TOPICAL_OINTMENT | Freq: Two times a day (BID) | CUTANEOUS | Status: DC
  Administered 2024-09-05 – 2024-09-06 (×3): 1 via TOPICAL
  Filled 2024-09-05: qty 22

## 2024-09-05 MED ORDER — INSULIN ASPART 100 UNIT/ML IJ SOLN: 0.0000 [IU] | Freq: Every day | INTRAMUSCULAR | Status: DC

## 2024-09-05 MED ORDER — CHLORHEXIDINE GLUCONATE CLOTH 2 % EX PADS
6.0000 | MEDICATED_PAD | Freq: Every day | CUTANEOUS | Status: DC
  Administered 2024-09-06 – 2024-09-07 (×2): 6 via TOPICAL

## 2024-09-05 MED ORDER — HYDROXYZINE HCL 25 MG PO TABS: 50.0000 mg | ORAL_TABLET | Freq: Three times a day (TID) | ORAL | Status: DC | PRN

## 2024-09-05 MED ORDER — METOCLOPRAMIDE HCL 5 MG/ML IJ SOLN
10.0000 mg | Freq: Once | INTRAMUSCULAR | Status: AC
  Administered 2024-09-05: 10 mg via INTRAVENOUS
  Filled 2024-09-05: qty 2

## 2024-09-05 MED ORDER — GABAPENTIN 100 MG PO CAPS
100.0000 mg | ORAL_CAPSULE | Freq: Every day | ORAL | Status: DC
  Administered 2024-09-05 – 2024-09-06 (×2): 100 mg via ORAL
  Filled 2024-09-05 (×2): qty 1

## 2024-09-05 MED ORDER — ACETAMINOPHEN 650 MG RE SUPP: 650.0000 mg | Freq: Four times a day (QID) | RECTAL | Status: DC | PRN

## 2024-09-05 MED ORDER — HYDRALAZINE HCL 20 MG/ML IJ SOLN: 10.0000 mg | INTRAMUSCULAR | Status: DC | PRN

## 2024-09-05 MED ORDER — ACETAMINOPHEN 325 MG PO TABS: 650.0000 mg | ORAL_TABLET | Freq: Four times a day (QID) | ORAL | Status: DC | PRN

## 2024-09-05 MED ORDER — MORPHINE SULFATE (PF) 4 MG/ML IV SOLN
4.0000 mg | Freq: Once | INTRAVENOUS | Status: AC
  Administered 2024-09-05: 4 mg via INTRAVENOUS
  Filled 2024-09-05: qty 1

## 2024-09-05 MED ORDER — SCOPOLAMINE 1 MG/3DAYS TD PT72
1.0000 | MEDICATED_PATCH | TRANSDERMAL | Status: DC
  Administered 2024-09-06: 1 mg via TRANSDERMAL
  Filled 2024-09-05: qty 1

## 2024-09-05 MED ORDER — ALBUTEROL SULFATE (2.5 MG/3ML) 0.083% IN NEBU: 2.5000 mg | INHALATION_SOLUTION | Freq: Four times a day (QID) | RESPIRATORY_TRACT | Status: DC | PRN

## 2024-09-05 MED ORDER — SODIUM CHLORIDE 0.9 % IV SOLN
100.0000 mg | Freq: Two times a day (BID) | INTRAVENOUS | Status: DC
  Administered 2024-09-05 – 2024-09-06 (×2): 100 mg via INTRAVENOUS
  Filled 2024-09-05 (×4): qty 100

## 2024-09-05 MED ORDER — SODIUM CHLORIDE 0.9 % IV SOLN
1.0000 g | INTRAVENOUS | Status: DC
  Administered 2024-09-05 – 2024-09-07 (×3): 1 g via INTRAVENOUS
  Filled 2024-09-05 (×3): qty 10

## 2024-09-05 MED ORDER — PANTOPRAZOLE SODIUM 40 MG IV SOLR
40.0000 mg | Freq: Once | INTRAVENOUS | Status: AC
  Administered 2024-09-05: 40 mg via INTRAVENOUS
  Filled 2024-09-05: qty 10

## 2024-09-05 MED ORDER — SODIUM CHLORIDE 0.9 % IV SOLN
100.0000 mg | Freq: Two times a day (BID) | INTRAVENOUS | Status: DC
  Filled 2024-09-05 (×2): qty 100

## 2024-09-05 MED ORDER — CARVEDILOL 6.25 MG PO TABS
6.2500 mg | ORAL_TABLET | Freq: Two times a day (BID) | ORAL | Status: DC
  Administered 2024-09-05 – 2024-09-06 (×3): 6.25 mg via ORAL
  Filled 2024-09-05: qty 2
  Filled 2024-09-05 (×2): qty 1

## 2024-09-05 MED ORDER — LORAZEPAM 2 MG/ML IJ SOLN
0.5000 mg | Freq: Once | INTRAMUSCULAR | Status: AC
  Administered 2024-09-05: 0.5 mg via INTRAVENOUS
  Filled 2024-09-05: qty 1

## 2024-09-05 MED ORDER — TORSEMIDE 20 MG PO TABS
20.0000 mg | ORAL_TABLET | ORAL | Status: DC
  Administered 2024-09-07: 20 mg via ORAL
  Filled 2024-09-05: qty 1

## 2024-09-05 MED ORDER — PANTOPRAZOLE SODIUM 40 MG IV SOLR
40.0000 mg | Freq: Two times a day (BID) | INTRAVENOUS | Status: AC
  Administered 2024-09-05 – 2024-09-06 (×4): 40 mg via INTRAVENOUS
  Filled 2024-09-05 (×4): qty 10

## 2024-09-05 MED ORDER — PROMETHAZINE HCL 25 MG PO TABS: 12.5000 mg | ORAL_TABLET | Freq: Four times a day (QID) | ORAL | Status: DC | PRN

## 2024-09-05 MED ORDER — INSULIN PUMP
Freq: Three times a day (TID) | SUBCUTANEOUS | Status: DC
  Filled 2024-09-05: qty 1

## 2024-09-05 MED ORDER — SACUBITRIL-VALSARTAN 24-26 MG PO TABS
1.0000 | ORAL_TABLET | ORAL | Status: DC
  Administered 2024-09-07: 1 via ORAL
  Filled 2024-09-05: qty 1

## 2024-09-05 MED ORDER — SODIUM CHLORIDE 0.9% FLUSH
3.0000 mL | Freq: Two times a day (BID) | INTRAVENOUS | Status: DC
  Administered 2024-09-05 – 2024-09-06 (×4): 3 mL via INTRAVENOUS

## 2024-09-05 MED ORDER — STERILE WATER FOR INJECTION IJ SOLN
INTRAMUSCULAR | Status: AC
  Administered 2024-09-05: 10 mL
  Filled 2024-09-05: qty 10

## 2024-09-05 MED ORDER — INSULIN ASPART 100 UNIT/ML IJ SOLN
0.0000 [IU] | Freq: Three times a day (TID) | INTRAMUSCULAR | Status: DC
  Administered 2024-09-06 (×2): 5 [IU] via SUBCUTANEOUS
  Administered 2024-09-07: 8 [IU] via SUBCUTANEOUS
  Filled 2024-09-05: qty 5
  Filled 2024-09-05: qty 8
  Filled 2024-09-05: qty 5

## 2024-09-05 MED ORDER — TRIMETHOBENZAMIDE HCL 100 MG/ML IM SOLN: 200.0000 mg | Freq: Four times a day (QID) | INTRAMUSCULAR | Status: DC | PRN

## 2024-09-05 NOTE — H&P (Addendum)
 " History and Physical    Patient: [name] FMW:[id number] DOB: Feb 26, 1971 DOA: 09/05/2024 DOS: the patient was seen and examined on 09/05/2024 PCP: Dwight Trula SQUIBB, MD  Patient coming from: Home  Chief Complaint:  Chief Complaint  Patient presents with   Emesis   HPI: [name] is a 54 y.o. female with medical history significant of ESRD on HD, diabetes mellitus type 1, diabetic gastroparesis, hypertension, hyperlipidemia, combined systolic and diastolic CHF, CAD, chronic anemia, anxiety, depression, gastric ulcer, and GERD with esophagitis presents with complaints of vomiting blood  She has been experiencing vomiting since Friday, which became bloody early this morning around 1 AM. This episode of hematemesis follows two days of non-bloody vomiting.  She is on a dialysis schedule of Monday, Wednesday, and Friday, with the last session on Friday. She is due for her next session tomorrow. She has end-stage renal disease and uses a left thigh graft for dialysis access.  She uses an insulin  pump, which is currently functioning properly. Her blood sugars have been running high, requiring adjustments during dialysis to prevent hypoglycemia.  She reports a cough that produces red blood and has experienced some stomach pain, described as a 'little bit' on the lower left side.  No shortness of breath.  Notes previous episode in vomiting blood last occurred back in 07/2023 where EGD done by Dr. Dianna revealed LA grade C esophagitis with esophageal ulcer and acute gastritis.   In the emergency department patient was noted to be afebrile with heart rates 86-1 04, respirations 11-36, blood pressure was elevated up to 205/115, and O2 saturations initially as low as 87% with improvement of 4 L nasal cannula oxygen.  Labs significant for WBC 13.1, hemoglobin 10.2, potassium 3.9, CO2 23, BUN 51, creatinine 6.51, glucose 296, and anion gap 17.  Patient was seen actively vomiting up what appeared  to be blood while in the ED. Dr. Burnette of [facility] was consulted.  Patient was given Protonix  40 mg IV x 1 dose, morphine  4 mg IV, a total Reglan  15 mg IV, and Ativan  0.5 mg IV.  Review of Systems: As mentioned in the history of present illness. All other systems reviewed and are negative. Past Medical History:  Diagnosis Date   AICD (automatic cardioverter/defibrillator) present    [employer]   Anemia    Anxiety    Arthritis    Asthma    CHF (congestive heart failure) (HCC)    Chronic diastolic CHF (congestive heart failure) (HCC) 12/14/2019   Depression    Diabetic ulcer of left foot (HCC) 05/12/2013   ESRD on hemodialysis (HCC)    MWF at [facility]   Gastroparesis    Generalized abdominal pain    GERD (gastroesophageal reflux disease)    History of chicken pox    Loss of weight 12/02/2019   Migraines    Mixed hyperlipidemia 09/05/2022   Mood disorder    anxiety   Myocardial infarction [facility])    NSTEMI (non-ST elevated myocardial infarction) (HCC) 05/13/2022   Prurigo nodularis    with diabetic dermopathy   Type 1 diabetes mellitus (HCC) 05/23/2022   Type 1 diabetes, uncontrolled, with neuropathy    [place]   Ulcers of both lower legs (HCC) 02/20/2014   Past Surgical History:  Procedure Laterality Date   A/V SHUNT INTERVENTION N/A 11/06/2023   Procedure: A/V SHUNT INTERVENTION;  Surgeon: Melia Lynwood ORN, MD;  Location: [facility] INVASIVE CV LAB;  Service: Cardiovascular;  Laterality: N/A;   AV FISTULA  PLACEMENT Left 10/01/2022   Procedure: ARTERIOVENOUS (AV)FISTULA CREATION;  Surgeon: Oris Krystal FALCON, MD;  Location: [facility] ORS;  Service: Vascular;  Laterality: Left;   AV FISTULA PLACEMENT Right 11/19/2022   Procedure: INSERTION OF RIGHT ARM ARTERIOVENOUS (AV) GORE-TEX GRAFT;  Surgeon: Eliza Lonni RAMAN, MD;  Location: [facility] OR;  Service: Vascular;  Laterality: Right;   AV FISTULA PLACEMENT Right 07/17/2023   Procedure: INSERTION OF ARTERIOVENOUS (AV) GOR-TEX GRAFT RIGHT ARM;  Surgeon: Magda Debby SAILOR, MD;  Location: [facility] OR;  Service: Vascular;  Laterality: Right;   AV FISTULA PLACEMENT Left 09/18/2023   Procedure: INSERTION OF ARTERIOVENOUS (AV) GORE-TEX GRAFT THIGH;  Surgeon: Magda Debby SAILOR, MD;  Location: [facility] OR;  Service: Vascular;  Laterality: Left;   BIOPSY  08/11/2020   Procedure: BIOPSY;  Surgeon: Aneita Gwendlyn DASEN, MD;  Location: [facility] ENDOSCOPY;  Service: Endoscopy;;   BIOPSY  04/02/2022   Procedure: BIOPSY;  Surgeon: Charlanne Groom, MD;  Location: [facility] ENDOSCOPY;  Service: Gastroenterology;;   ESOPHAGOGASTRODUODENOSCOPY (EGD) WITH PROPOFOL  N/A 08/11/2020   Procedure: ESOPHAGOGASTRODUODENOSCOPY (EGD) WITH PROPOFOL ;  Surgeon: Aneita Gwendlyn DASEN, MD;  Location: [facility] ENDOSCOPY;  Service: Endoscopy;  Laterality: N/A;   ESOPHAGOGASTRODUODENOSCOPY (EGD) WITH PROPOFOL  N/A 04/02/2022   Procedure: ESOPHAGOGASTRODUODENOSCOPY (EGD) WITH PROPOFOL ;  Surgeon: Charlanne Groom, MD;  Location: [facility] ENDOSCOPY;  Service: Gastroenterology;  Laterality: N/A;   ESOPHAGOGASTRODUODENOSCOPY (EGD) WITH PROPOFOL  N/A 05/23/2022   Procedure: ESOPHAGOGASTRODUODENOSCOPY (EGD) WITH PROPOFOL ;  Surgeon: Jinny Carmine, MD;  Location: [facility] ENDOSCOPY;  Service: Endoscopy;  Laterality: N/A;   ESOPHAGOGASTRODUODENOSCOPY (EGD) WITH PROPOFOL  N/A 07/15/2023   Procedure: ESOPHAGOGASTRODUODENOSCOPY (EGD) WITH PROPOFOL ;  Surgeon: Dianna Specking, MD;  Location: [facility] ENDOSCOPY;  Service: Gastroenterology;  Laterality: N/A;   ICD IMPLANT N/A 04/09/2022   Procedure: ICD IMPLANT;  Surgeon: Inocencio Soyla Lunger, MD;  Location: [facility] INVASIVE CV LAB;  Service: Cardiovascular;  Laterality: N/A;   IR FLUORO GUIDE CV LINE RIGHT  10/07/2022   IR US  GUIDE VASC ACCESS RIGHT  10/07/2022   PERIPHERAL VASCULAR THROMBECTOMY Left 03/24/2024   Procedure: PERIPHERAL VASCULAR THROMBECTOMY;  Surgeon: Pearline Norman RAMAN, MD;  Location: [facility] LAB;  Service: Cardiovascular;  Laterality: Left;   treadmill stress test  01/2013   WNL, low risk study   VENOUS STENT  03/24/2024    Procedure: VENOUS STENT;  Surgeon: Pearline Norman RAMAN, MD;  Location: [facility];  Service: Cardiovascular;;  left upper thigh graft   Social History:  reports that she has never smoked. She has never used smokeless tobacco. She reports that she does not currently use alcohol . She reports that she does not use drugs.  Allergies[1]  Family History  Problem Relation Age of Onset   Diabetes Mother        type 2   Hypertension Mother    Thyroid  disease Mother    Bipolar disorder Mother    Heart disease Mother    Calcium  disorder Mother    Cancer Maternal Grandmother        Breast, stomach   Cancer Paternal Grandmother        stomach, lung (smoker)   Diabetes Paternal Grandmother    Diabetes Paternal Grandfather    Heart failure Sister    Diabetes Sister    Stroke Maternal Aunt    Cancer Maternal Uncle        prostate   CAD Maternal Aunt        stents   Cancer Maternal Aunt 12       ovarian  Prior to Admission medications  Medication Sig Start Date End Date Taking? Authorizing Provider  acetaminophen  (TYLENOL ) 500 MG tablet Take 1,000 mg by mouth every 8 (eight) hours as needed for moderate pain or mild pain.    [provider]  albuterol  (PROAIR  HFA) 108 (90 Base) MCG/ACT inhaler Inhale 2 puffs into the lungs every 6 (six) hours as needed for wheezing or shortness of breath. 07/03/20   Gladis Elsie BROCKS, PA-C  calcium  carbonate (TUMS EX) 750 MG chewable tablet Chew 2 tablets by mouth daily as needed for heartburn.    [provider]  carvedilol  (COREG ) 6.25 MG tablet Take 1 tablet (6.25 mg total) by mouth 2 (two) times daily. 12/04/23   Zenaida Morene PARAS, MD  ferric citrate (AURYXIA) 1 GM 210 MG(Fe) tablet Take 210-630 mg by mouth 3 (three) times daily with meals.    [provider]  FLUoxetine  (PROZAC ) 10 MG tablet Take 10 mg by mouth daily. 07/06/24   [provider]  gabapentin  (NEURONTIN ) 100 MG capsule Take 100 mg by mouth at bedtime.     [provider]  hydrOXYzine  (ATARAX ) 50 MG tablet Take 50 mg by mouth 3 (three) times daily as needed for anxiety.    [provider]  insulin  aspart (NOVOLOG ) 100 UNIT/ML injection Inject 120 Units into the skin See admin instructions. Patient uses this with the Omnipod insulin  pump  its vary according to blood sugar. Loads 120 units every 3 days. 05/02/23   Ghimire, Donalda HERO, MD  Insulin  Disposable Pump (OMNIPOD DASH PODS, GEN 4,) MISC Place 1 Applicatorful onto the skin See admin instructions. Every three days    [provider]  LORazepam  (ATIVAN ) 0.5 MG tablet Take 1 tablet (0.5 mg total) by mouth every 8 (eight) hours as needed for anxiety (Refractory nausea and vomiting). 07/17/22   Fairy Frames, MD  metoCLOPramide  (REGLAN ) 10 MG tablet Take 10 mg by mouth every 8 (eight) hours as needed for vomiting or nausea.    [provider]  midodrine  (PROAMATINE ) 5 MG tablet Take 1 tablet once daily as needed on dialysis days 08/09/24   Stoner, Benjamin J, MD  mupirocin  ointment (BACTROBAN ) 2 % Apply 1 Application topically 2 (two) times daily. 02/03/24   Christine Rush, DPM  nitroGLYCERIN  (NITROSTAT ) 0.4 MG SL tablet Place 1 tablet (0.4 mg total) under the tongue every 5 (five) minutes as needed for chest pain. 09/05/22 08/09/25  Santo Stanly LABOR, MD  pantoprazole  (PROTONIX ) 40 MG tablet Take 1 tablet (40 mg total) by mouth 2 (two) times daily. 02/05/24 01/30/25  Zenaida Morene PARAS, MD  polyethylene glycol (MIRALAX  / GLYCOLAX ) 17 g packet Take 17 g by mouth daily as needed for moderate constipation.    [provider]  prochlorperazine  (COMPAZINE ) 5 MG tablet Take 1 tablet (5 mg total) by mouth every 6 (six) hours as needed for nausea or vomiting. 05/01/23   Ghimire, Donalda HERO, MD  sacubitril -valsartan  (ENTRESTO ) 24-26 MG Take on non dialysis days 11/13/23   Zenaida Morene PARAS, MD  scopolamine  (TRANSDERM-SCOP) 1 MG/3DAYS Place 1 patch onto the skin every 3 (three)  days.    [provider]  tobramycin  (TOBREX ) 0.3 % ophthalmic solution Place 1 drop into both eyes See admin instructions. Instill 1 drop into both eyes once every 8 weeks before eye injections 04/21/23   [provider]  torsemide  (DEMADEX ) 20 MG tablet Take on non dialysis days 11/13/23   Zenaida Morene PARAS, MD  TRESIBA  FLEXTOUCH 100  UNIT/ML FlexTouch Pen Inject into the skin as needed (when insulin  pump is not working). 06/02/23   [provider]    Physical Exam: Vitals:   09/05/24 9045 09/05/24 0955 09/05/24 0956 09/05/24 0957  BP: (!) 174/93     Pulse: 91 88 88 86  Resp: (!) 25 (!) 33 (!) 36 11  Temp:      TempSrc:      SpO2: 97% 91% (!) 87% 97%  Weight:      Height:        Constitutional: Middle-aged female who appears to feel unwell, but able to follow commands Eyes: PERRL, lids and conjunctivae normal ENMT: Mucous membranes are moist.   Neck: normal, supple  Respiratory: Normal respiratory effort.  Patient currently on 4 L nasal cannula oxygen with O2 saturation maintained. Cardiovascular: Regular rate and rhythm, no murmurs / rubs / gallops. No extremity edema. 2+ pedal pulses.   Abdomen:  tenderness to palpation of the left quadrant of the abdomen.  Bowel sounds positive.  Musculoskeletal: no clubbing / cyanosis. No joint deformity upper and lower extremities. Good ROM, no contractures. Normal muscle tone.  Skin: no rashes, lesions, ulcers. No induration Neurologic: CN 2-12 grossly intact.   Strength 5/5 in all 4.  Psychiatric: Normal judgment and insight.  Lethargic, but oriented x 3. Normal mood.   Data Reviewed:  EKG revealed normal sinus rhythm at 90 bpm with QTc 534.  Reviewed labs, imaging, and pertinent records as documented  Assessment and Plan:  Hematemesis History of gastroparesis Acute.  Patient presents with complaints of nausea and vomiting which started initially 2 days ago.  This morning emesis became bloody.  Patient with  known history of gastroparesis.  Prior history of similar back in 07/2023 where EGD revealed LA grade C esophagitis with esophageal ulcer and acute gastritis. Vital signs currently are to be stable and hemoglobin appears near baseline at 10.2.  Patient had been given Protonix  40 mg IV. - Admit to a telemetry bed - Aspiration precautions with elevation head of bed - Gastroparesis clear liquid diet.  Reassess in a.m. and advance as tolerated - Monitor H&H - Protonix  40 mg IV twice daily x 2 days - Tigan  IV as needed for elevated blood pressures - Continue scopolamine  patch - Appreciate [facility] consultative service,  will follow-up for any further recommendations  Respiratory failure with hypoxia Question edema versus pneumonia Acute.  Patient reports that she has had a productive cough.  On admission was noted to be hypoxic down to 87% with improvement on 4 L nasal cannula oxygen.  CT scan of the chest noting near consolidative opacities of the left upper lobe with scattered ground glass opacities involving additional lobes.  Question aspiration given reports of repeated days of blood - Check procalcitonin and proBNP - Start empiric antibiotics of Rocephin  and doxycycline .  De-escalate when deemed medically appropriate.  Leukocytosis Acute.  WBC elevated at 13.1.  Suspect secondary to above.  Patient also reported some left lower quadrant abdominal pain. - Continue to monitor - Check urinalysis if able to obtain    Hypertensive urgency Blood pressures initially noted to be elevated up to 205/150 - Continue Coreg , Entresto  - Hydralazine  IV as needed for elevated blood pressures   Prolonged QT interval QTc noted to be 534. - Avoid QT prolonging medications - correct any electrolyte abnormalities  Anemia of chronic disease Hemoglobin noted to be 10.2 which appears around patient's baseline. - Continue to monitor H&H  End-stage renal disease  Patient on a Monday, Wednesday, Friday  schedule.  Last dialyzed 1/30. - Nephrology consulted for the likely need of dialysis during hospitalization  Diabetes mellitus type 1 On admission glucose noted to be elevated at 296.  Last available hemoglobin A1c noted to be 8.8 when checked 06/2024. Patient on insulin  pump which she would like to continue at this time, but does report recently started on a new pump. - Insulin  pump order set utilized - CBGs before every meal and at bedtime - Diabetic education consulted  GERD with esophagitis - Protonix  as noted above  DVT prophylaxis: SCDs Advance Care Planning:   Code Status: Full Code    Consults: Gastroenterology, nephrology  Family Communication: Son update over the phone, but no answer  Severity of Illness: The appropriate patient status for this patient is INPATIENT. Inpatient status is judged to be reasonable and necessary in order to provide the required intensity of service to ensure the patient's safety. The patient's presenting symptoms, physical exam findings, and initial radiographic and laboratory data in the context of their chronic comorbidities is felt to place them at high risk for further clinical deterioration. Furthermore, it is not anticipated that the patient will be medically stable for discharge from the hospital within 2 midnights of admission.   * I certify that at the point of admission it is my clinical judgment that the patient will require inpatient hospital care spanning beyond 2 midnights from the point of admission due to high intensity of service, high risk for further deterioration and high frequency of surveillance required.*   Author: Maximino DELENA Sharps, MD 09/05/2024 10:52 AM  For on call review [email or web address].      [1]  Allergies Allergen Reactions   Atorvastatin  Other (See Comments)    Dizziness    Dulaglutide Nausea And Vomiting and Other (See Comments)    Caused pancreatitis    Hydrocodone      Other Reaction(s): vomiting   Ivp Dye  [Iodinated Contrast Media] Hives, Itching and Nausea And Vomiting    tremors   Metoclopramide  Other (See Comments)    Possible movement disorder or tardive dyskinesia while on reglan  Long term   Other Itching    Peas and carrots   Sertraline Nausea And Vomiting   Technetium Tc 99m  Tetrofosmin      Other Reaction(s): Unknown   Vancomycin  Itching and Swelling   Zofran  [Ondansetron ] Nausea And Vomiting and Other (See Comments)    Causes nausea vomiting to worsen / doesn't really work for patient.   Amoxicillin Itching and Rash   Lantus  [Insulin  Glargine] Itching and Rash    Has tolerated insulin  glargine many times with no reported issues   Miconazole Nitrate Nausea And Vomiting and Rash   "

## 2024-09-05 NOTE — Consult Note (Signed)
 [facility] Consultation Note  Referring Provider: [facility] Primary Care Physician:  Dwight Trula SQUIBB, MD Primary Gastroenterologist:  Dr. Kriss  Reason for Consultation:  coffee ground emesis  HPI: [name] is a 54 y.o. female multiple medical problems, esophagitis, gastroparesis, presents nausea/vomiting and coffee ground emesis.  No frank hematemesis.  No melena/hematochezia.  Mild abdominal discomfort.  Multiple similar admissions for same symptoms, multiple endoscopies for same problem, most recent by Dr. Dianna in December 2024.   Past Medical History:  Diagnosis Date   AICD (automatic cardioverter/defibrillator) present    [employer]   Anemia    Anxiety    Arthritis    Asthma    CHF (congestive heart failure) (HCC)    Chronic diastolic CHF (congestive heart failure) (HCC) 12/14/2019   Depression    Diabetic ulcer of left foot (HCC) 05/12/2013   ESRD on hemodialysis (HCC)    MWF at [facility]   Gastroparesis    Generalized abdominal pain    GERD (gastroesophageal reflux disease)    History of chicken pox    Loss of weight 12/02/2019   Migraines    Mixed hyperlipidemia 09/05/2022   Mood disorder    anxiety   Myocardial infarction [facility])    NSTEMI (non-ST elevated myocardial infarction) (HCC) 05/13/2022   Prurigo nodularis    with diabetic dermopathy   Type 1 diabetes mellitus (HCC) 05/23/2022   Type 1 diabetes, uncontrolled, with neuropathy    [place]   Ulcers of both lower legs (HCC) 02/20/2014    Past Surgical History:  Procedure Laterality Date   A/V SHUNT INTERVENTION N/A 11/06/2023   Procedure: A/V SHUNT INTERVENTION;  Surgeon: Melia Lynwood ORN, MD;  Location: [facility] INVASIVE CV LAB;  Service: Cardiovascular;  Laterality: N/A;   AV FISTULA PLACEMENT Left 10/01/2022   Procedure: ARTERIOVENOUS (AV)FISTULA CREATION;  Surgeon: Oris Krystal FALCON, MD;  Location: [facility] ORS;  Service: Vascular;  Laterality: Left;   AV FISTULA PLACEMENT Right 11/19/2022    Procedure: INSERTION OF RIGHT ARM ARTERIOVENOUS (AV) GORE-TEX GRAFT;  Surgeon: Eliza Lonni RAMAN, MD;  Location: [facility] OR;  Service: Vascular;  Laterality: Right;   AV FISTULA PLACEMENT Right 07/17/2023   Procedure: INSERTION OF ARTERIOVENOUS (AV) GOR-TEX GRAFT RIGHT ARM;  Surgeon: Magda Debby SAILOR, MD;  Location: [facility] OR;  Service: Vascular;  Laterality: Right;   AV FISTULA PLACEMENT Left 09/18/2023   Procedure: INSERTION OF ARTERIOVENOUS (AV) GORE-TEX GRAFT THIGH;  Surgeon: Magda Debby SAILOR, MD;  Location: [facility] OR;  Service: Vascular;  Laterality: Left;   BIOPSY  08/11/2020   Procedure: BIOPSY;  Surgeon: Aneita Gwendlyn DASEN, MD;  Location: [facility] ENDOSCOPY;  Service: Endoscopy;;   BIOPSY  04/02/2022   Procedure: BIOPSY;  Surgeon: Charlanne Groom, MD;  Location: [facility] ENDOSCOPY;  Service: Gastroenterology;;   ESOPHAGOGASTRODUODENOSCOPY (EGD) WITH PROPOFOL  N/A 08/11/2020   Procedure: ESOPHAGOGASTRODUODENOSCOPY (EGD) WITH PROPOFOL ;  Surgeon: Aneita Gwendlyn DASEN, MD;  Location: [facility] ENDOSCOPY;  Service: Endoscopy;  Laterality: N/A;   ESOPHAGOGASTRODUODENOSCOPY (EGD) WITH PROPOFOL  N/A 04/02/2022   Procedure: ESOPHAGOGASTRODUODENOSCOPY (EGD) WITH PROPOFOL ;  Surgeon: Charlanne Groom, MD;  Location: [facility] ENDOSCOPY;  Service: Gastroenterology;  Laterality: N/A;   ESOPHAGOGASTRODUODENOSCOPY (EGD) WITH PROPOFOL  N/A 05/23/2022   Procedure: ESOPHAGOGASTRODUODENOSCOPY (EGD) WITH PROPOFOL ;  Surgeon: Jinny Carmine, MD;  Location: [facility] ENDOSCOPY;  Service: Endoscopy;  Laterality: N/A;   ESOPHAGOGASTRODUODENOSCOPY (EGD) WITH PROPOFOL  N/A 07/15/2023   Procedure: ESOPHAGOGASTRODUODENOSCOPY (EGD) WITH PROPOFOL ;  Surgeon: Dianna Specking, MD;  Location: [facility] ENDOSCOPY;  Service: Gastroenterology;  Laterality: N/A;   ICD IMPLANT  N/A 04/09/2022   Procedure: ICD IMPLANT;  Surgeon: Inocencio Soyla Lunger, MD;  Location: [facility] INVASIVE CV LAB;  Service: Cardiovascular;  Laterality: N/A;   IR FLUORO GUIDE CV LINE RIGHT  10/07/2022   IR US  GUIDE VASC ACCESS  RIGHT  10/07/2022   PERIPHERAL VASCULAR THROMBECTOMY Left 03/24/2024   Procedure: PERIPHERAL VASCULAR THROMBECTOMY;  Surgeon: Pearline Norman RAMAN, MD;  Location: [facility] LAB;  Service: Cardiovascular;  Laterality: Left;   treadmill stress test  01/2013   WNL, low risk study   VENOUS STENT  03/24/2024   Procedure: VENOUS STENT;  Surgeon: Pearline Norman RAMAN, MD;  Location: [facility];  Service: Cardiovascular;;  left upper thigh graft    Prior to Admission medications  Medication Sig Start Date End Date Taking? Authorizing Provider  acetaminophen  (TYLENOL ) 500 MG tablet Take 1,000 mg by mouth every 8 (eight) hours as needed for moderate pain or mild pain.   Yes [provider]  albuterol  (PROAIR  HFA) 108 (90 Base) MCG/ACT inhaler Inhale 2 puffs into the lungs every 6 (six) hours as needed for wheezing or shortness of breath. 07/03/20  Yes Lunger Elsie BROCKS, PA-C  calcium  carbonate (TUMS EX) 750 MG chewable tablet Chew 2 tablets by mouth daily as needed for heartburn.   Yes [provider]  carvedilol  (COREG ) 6.25 MG tablet Take 1 tablet (6.25 mg total) by mouth 2 (two) times daily. 12/04/23  Yes Zenaida Morene PARAS, MD  ferric citrate (AURYXIA) 1 GM 210 MG(Fe) tablet Take 210-630 mg by mouth 3 (three) times daily with meals.   Yes [provider]  FLUoxetine  (PROZAC ) 10 MG tablet Take 10 mg by mouth daily. 07/06/24  Yes [provider]  gabapentin  (NEURONTIN ) 100 MG capsule Take 100 mg by mouth at bedtime.   Yes [provider]  hydrOXYzine  (ATARAX ) 50 MG tablet Take 50 mg by mouth 3 (three) times daily as needed for anxiety.   Yes [provider]  insulin  aspart (NOVOLOG ) 100 UNIT/ML injection Inject 120 Units into the skin See admin instructions. Patient uses this with the Omnipod insulin  pump  its vary according to blood sugar. Loads 120 units every 3 days. 05/02/23  Yes Ghimire, Donalda HERO, MD  Insulin  Disposable Pump (OMNIPOD DASH PODS, GEN 4,) MISC Place 1  Applicatorful onto the skin See admin instructions. Every three days   Yes [provider]  LORazepam  (ATIVAN ) 0.5 MG tablet Take 1 tablet (0.5 mg total) by mouth every 8 (eight) hours as needed for anxiety (Refractory nausea and vomiting). 07/17/22  Yes Fairy Frames, MD  metoCLOPramide  (REGLAN ) 10 MG tablet Take 10 mg by mouth every 8 (eight) hours as needed for vomiting or nausea.   Yes [provider]  midodrine  (PROAMATINE ) 5 MG tablet Take 1 tablet once daily as needed on dialysis days 08/09/24  Yes Zenaida Morene PARAS, MD  mupirocin  ointment (BACTROBAN ) 2 % Apply 1 Application topically 2 (two) times daily. 02/03/24  Yes Petery, John, DPM  nitroGLYCERIN  (NITROSTAT ) 0.4 MG SL tablet Place 1 tablet (0.4 mg total) under the tongue every 5 (five) minutes as needed for chest pain. 09/05/22 08/09/25 Yes Chandrasekhar, Mahesh A, MD  pantoprazole  (PROTONIX ) 40 MG tablet Take 1 tablet (40 mg total) by mouth 2 (two) times daily. 02/05/24 01/30/25 Yes Zenaida Morene PARAS, MD  polyethylene glycol (MIRALAX  / GLYCOLAX ) 17 g packet Take 17 g by mouth daily as needed for moderate constipation.   Yes [provider]  prochlorperazine  (COMPAZINE ) 5 MG  tablet Take 1 tablet (5 mg total) by mouth every 6 (six) hours as needed for nausea or vomiting. 05/01/23  Yes Ghimire, Donalda HERO, MD  sacubitril -valsartan  (ENTRESTO ) 24-26 MG Take on non dialysis days Patient taking differently: Take 1 tablet by mouth See admin instructions. Take one tablet by mouth on Tuesdays, Thursdays, Saturdays, Sundays per patient 11/13/23  Yes Zenaida Morene PARAS, MD  TRESIBA  FLEXTOUCH 100 UNIT/ML FlexTouch Pen Inject into the skin as needed (when insulin  pump is not working). 06/02/23  Yes [provider]  scopolamine  (TRANSDERM-SCOP) 1 MG/3DAYS Place 1 patch onto the skin every 3 (three) days.    [provider]  tobramycin  (TOBREX ) 0.3 % ophthalmic solution Place 1 drop into both eyes See admin instructions.  Instill 1 drop into both eyes once every 8 weeks before eye injections 04/21/23   [provider]  torsemide  (DEMADEX ) 20 MG tablet Take on non dialysis days Patient taking differently: Take 20 mg by mouth See admin instructions. Take one tablet by  mouth on Tuesdays, Thursdays, Saturdays and Sundays per patient 11/13/23   Zenaida Morene PARAS, MD    Current Facility-Administered Medications  Medication Dose Route Frequency Provider Last Rate Last Admin   acetaminophen  (TYLENOL ) tablet 650 mg  650 mg Oral Q6H PRN Smith, Rondell A, MD       Or   acetaminophen  (TYLENOL ) suppository 650 mg  650 mg Rectal Q6H PRN Smith, Rondell A, MD       albuterol  (PROVENTIL ) (2.5 MG/3ML) 0.083% nebulizer solution 2.5 mg  2.5 mg Nebulization Q6H PRN Smith, Rondell A, MD       cefTRIAXone  (ROCEPHIN ) 1 g in sodium chloride  0.9 % 100 mL IVPB  1 g Intravenous Q24H Smith, Rondell A, MD 200 mL/hr at 09/05/24 1202 1 g at 09/05/24 1202   doxycycline  (VIBRAMYCIN ) 100 mg in sodium chloride  0.9 % 250 mL IVPB  100 mg Intravenous Q12H Smith, Rondell A, MD       insulin  pump   Subcutaneous TID WC, HS, 0200 Smith, Rondell A, MD       pantoprazole  (PROTONIX ) injection 40 mg  40 mg Intravenous Q12H Smith, Rondell A, MD   40 mg at 09/05/24 1158   sodium chloride  flush (NS) 0.9 % injection 3 mL  3 mL Intravenous Q12H Smith, Rondell A, MD       trimethobenzamide  (TIGAN ) injection 200 mg  200 mg Intramuscular Q6H PRN Claudene Maximino LABOR, MD       Current Outpatient Medications  Medication Sig Dispense Refill   acetaminophen  (TYLENOL ) 500 MG tablet Take 1,000 mg by mouth every 8 (eight) hours as needed for moderate pain or mild pain.     albuterol  (PROAIR  HFA) 108 (90 Base) MCG/ACT inhaler Inhale 2 puffs into the lungs every 6 (six) hours as needed for wheezing or shortness of breath. 6.7 g 1   calcium  carbonate (TUMS EX) 750 MG chewable tablet Chew 2 tablets by mouth daily as needed for heartburn.     carvedilol  (COREG ) 6.25 MG  tablet Take 1 tablet (6.25 mg total) by mouth 2 (two) times daily. 180 tablet 3   ferric citrate (AURYXIA) 1 GM 210 MG(Fe) tablet Take 210-630 mg by mouth 3 (three) times daily with meals.     FLUoxetine  (PROZAC ) 10 MG tablet Take 10 mg by mouth daily.     gabapentin  (NEURONTIN ) 100 MG capsule Take 100 mg by mouth at bedtime.     hydrOXYzine  (ATARAX ) 50 MG tablet Take 50 mg  by mouth 3 (three) times daily as needed for anxiety.     insulin  aspart (NOVOLOG ) 100 UNIT/ML injection Inject 120 Units into the skin See admin instructions. Patient uses this with the Omnipod insulin  pump  its vary according to blood sugar. Loads 120 units every 3 days.     Insulin  Disposable Pump (OMNIPOD DASH PODS, GEN 4,) MISC Place 1 Applicatorful onto the skin See admin instructions. Every three days     LORazepam  (ATIVAN ) 0.5 MG tablet Take 1 tablet (0.5 mg total) by mouth every 8 (eight) hours as needed for anxiety (Refractory nausea and vomiting). 30 tablet 0   metoCLOPramide  (REGLAN ) 10 MG tablet Take 10 mg by mouth every 8 (eight) hours as needed for vomiting or nausea.     midodrine  (PROAMATINE ) 5 MG tablet Take 1 tablet once daily as needed on dialysis days 15 tablet 1   mupirocin  ointment (BACTROBAN ) 2 % Apply 1 Application topically 2 (two) times daily. 22 g 0   nitroGLYCERIN  (NITROSTAT ) 0.4 MG SL tablet Place 1 tablet (0.4 mg total) under the tongue every 5 (five) minutes as needed for chest pain. 90 tablet 3   pantoprazole  (PROTONIX ) 40 MG tablet Take 1 tablet (40 mg total) by mouth 2 (two) times daily. 60 tablet 11   polyethylene glycol (MIRALAX  / GLYCOLAX ) 17 g packet Take 17 g by mouth daily as needed for moderate constipation.     prochlorperazine  (COMPAZINE ) 5 MG tablet Take 1 tablet (5 mg total) by mouth every 6 (six) hours as needed for nausea or vomiting. 15 tablet 0   sacubitril -valsartan  (ENTRESTO ) 24-26 MG Take on non dialysis days (Patient taking differently: Take 1 tablet by mouth See admin  instructions. Take one tablet by mouth on Tuesdays, Thursdays, Saturdays, Sundays per patient) 45 tablet 11   TRESIBA  FLEXTOUCH 100 UNIT/ML FlexTouch Pen Inject into the skin as needed (when insulin  pump is not working).     scopolamine  (TRANSDERM-SCOP) 1 MG/3DAYS Place 1 patch onto the skin every 3 (three) days.     tobramycin  (TOBREX ) 0.3 % ophthalmic solution Place 1 drop into both eyes See admin instructions. Instill 1 drop into both eyes once every 8 weeks before eye injections     torsemide  (DEMADEX ) 20 MG tablet Take on non dialysis days (Patient taking differently: Take 20 mg by mouth See admin instructions. Take one tablet by  mouth on Tuesdays, Thursdays, Saturdays and Sundays per patient) 45 tablet 11    Allergies as of 09/05/2024 - Review Complete 09/05/2024  Allergen Reaction Noted   Atorvastatin  Other (See Comments) 04/23/2021   Dulaglutide Nausea And Vomiting and Other (See Comments) 05/23/2020   Hydrocodone   07/27/2024   Ivp dye [iodinated contrast media] Hives, Itching, and Nausea And Vomiting 06/17/2023   Metoclopramide  Other (See Comments) 09/27/2022   Other Itching 10/09/2022   Sertraline Nausea And Vomiting 09/05/2022   Technetium tc 99m  tetrofosmin   07/27/2024   Vancomycin  Itching and Swelling 11/04/2022   Zofran  [ondansetron ] Nausea And Vomiting and Other (See Comments) 04/24/2022   Amoxicillin Itching and Rash 11/25/2017   Lantus  [insulin  glargine] Itching and Rash 11/25/2017   Miconazole nitrate Nausea And Vomiting and Rash 05/23/2020    Family History  Problem Relation Age of Onset   Diabetes Mother        type 2   Hypertension Mother    Thyroid  disease Mother    Bipolar disorder Mother    Heart disease Mother    Calcium  disorder Mother  Cancer Maternal Grandmother        Breast, stomach   Cancer Paternal Grandmother        stomach, lung (smoker)   Diabetes Paternal Grandmother    Diabetes Paternal Grandfather    Heart failure Sister    Diabetes  Sister    Stroke Maternal Aunt    Cancer Maternal Uncle        prostate   CAD Maternal Aunt        stents   Cancer Maternal Aunt 56       ovarian    Social History   Socioeconomic History   Marital status: Divorced    Spouse name: Not on file   Number of children: 2   Years of education: Not on file   Highest education level: Some college, no degree  Occupational History   Occupation: production designer, theatre/television/film  Tobacco Use   Smoking status: Never   Smokeless tobacco: Never  Vaping Use   Vaping status: Never Used  Substance and Sexual Activity   Alcohol  use: Not Currently   Drug use: No   Sexual activity: Yes    Partners: Male    Birth control/protection: None  Other Topics Concern   Not on file  Social History Narrative   Caffeine use: daily   Occupation: cleans houses   Regular exercise: yes, active 5x/wk   Diet: good water , fruits/vegetables daily   Social Drivers of Health   Tobacco Use: Low Risk (09/05/2024)   Patient History    Smoking Tobacco Use: Never    Smokeless Tobacco Use: Never    Passive Exposure: Not on file  Financial Resource Strain: Medium Risk (12/26/2022)   Overall Financial Resource Strain (CARDIA)    Difficulty of Paying Living Expenses: Somewhat hard  Food Insecurity: No Food Insecurity (07/14/2023)   Hunger Vital Sign    Worried About Running Out of Food in the Last Year: Never true    Ran Out of Food in the Last Year: Never true  Transportation Needs: No Transportation Needs (07/14/2023)   PRAPARE - Administrator, Civil Service (Medical): No    Lack of Transportation (Non-Medical): No  Physical Activity: Inactive (12/26/2022)   Exercise Vital Sign    Days of Exercise per Week: 0 days    Minutes of Exercise per Session: 0 min  Stress: Stress Concern Present (01/07/2023)   [employer] of Occupational Health - Occupational Stress Questionnaire    Feeling of Stress : To some extent  Social Connections: Socially Isolated (12/26/2022)    Social Connection and Isolation Panel    Frequency of Communication with Friends and Family: Twice a week    Frequency of Social Gatherings with Friends and Family: More than three times a week    Attends Religious Services: Never    Database Administrator or Organizations: No    Attends Banker Meetings: Never    Marital Status: Divorced  Catering Manager Violence: Not At Risk (07/14/2023)   Humiliation, Afraid, Rape, and Kick questionnaire    Fear of Current or Ex-Partner: No    Emotionally Abused: No    Physically Abused: No    Sexually Abused: No  Depression (PHQ2-9): High Risk (12/26/2022)   Depression (PHQ2-9)    PHQ-2 Score: 14  Alcohol  Screen: Low Risk (07/15/2022)   Alcohol  Screen    Last Alcohol  Screening Score (AUDIT): 0  Housing: Low Risk (07/17/2023)   Housing Stability Vital Sign    Unable to Pay for Housing  in the Last Year: No    Number of Times Moved in the Last Year: 0    Homeless in the Last Year: No  Utilities: Not At Risk (07/14/2023)   AHC Utilities    Threatened with loss of utilities: No  Health Literacy: Not on file    Review of Systems: As per HPI, all others negative  Physical Exam: Vital signs in last 24 hours: Temp:  [97.5 F (36.4 C)-98.5 F (36.9 C)] 98.5 F (36.9 C) (02/01 1236) Pulse Rate:  [86-104] 86 (02/01 0957) Resp:  [11-36] 11 (02/01 0957) BP: (174-205)/(93-115) 174/93 (02/01 0954) SpO2:  [87 %-99 %] 97 % (02/01 0957) Weight:  [86.2 kg] 86.2 kg (02/01 0836)   General:   Alert,  Well-developed, chronically ill-appearing, older-appearing than state age, NAD Head:  Normocephalic and atraumatic. Eyes:  Sclera clear, no icterus.   Conjunctiva pink. Ears:  Normal auditory acuity. Nose:  No deformity, discharge,  or lesions. Mouth:  No deformity or lesions.  Oropharynx pink & moist. Neck:  Supple; no masses or thyromegaly. Lungs:  No visible distress Abdomen:  Soft, nontender and nondistended. No peritonitis Msk:   Symmetrical without gross deformities. Normal posture. Pulses:  Normal pulses noted. Extremities:  Without clubbing or edema. Neurologic:  Alert and  oriented x4;  grossly normal neurologically. Skin:  Intact without significant lesions or rashes. Psych:  Alert and cooperative. Normal mood and affect.   Lab Results: Recent Labs    09/05/24 0850 09/05/24 0859  WBC 13.1*  --   HGB 10.2* 11.2*  HCT 30.2* 33.0*  PLT 155  --    BMET Recent Labs    09/05/24 0850 09/05/24 0859  NA 139 138  K 3.9 3.7  CL 99 100  CO2 23  --   GLUCOSE 296* 300*  BUN 51* 52*  CREATININE 6.51* 7.40*  CALCIUM  9.1  --    LFT Recent Labs    09/05/24 0850  PROT 7.5  ALBUMIN  4.0  AST 16  ALT <5  ALKPHOS 89  BILITOT 1.0   PT/INR Recent Labs    09/05/24 0850  LABPROT 13.0  INR 0.9    Studies/Results: DG Chest Port 1 View Result Date: 09/05/2024 CLINICAL DATA:  Abdominal pain and hypertension. EXAM: PORTABLE CHEST 1 VIEW COMPARISON:  07/14/2023 FINDINGS: Left-sided pacemaker unchanged. Lungs are adequately inflated demonstrate airspace consolidation over the lingula and left upper lobe with subtle hazy perihilar/infrahilar opacification bilaterally. No effusion. Borderline cardiomegaly. Remainder of the exam is unchanged. IMPRESSION: Airspace consolidation over the lingula and left upper lobe with subtle hazy perihilar/infrahilar opacification bilaterally. Findings likely due to infection, although mild concomitant underlying edema is possible Electronically Signed   By: Toribio Agreste M.D.   On: 09/05/2024 09:58   CT CHEST ABDOMEN PELVIS WO CONTRAST Result Date: 09/05/2024 CLINICAL DATA:  Abdominal pain, acute, nonlocalized EXAM: CT CHEST, ABDOMEN AND PELVIS WITHOUT CONTRAST TECHNIQUE: Multidetector CT imaging of the chest, abdomen and pelvis was performed following the standard protocol without IV contrast. RADIATION DOSE REDUCTION: This exam was performed according to the departmental  dose-optimization program which includes automated exposure control, adjustment of the mA and/or kV according to patient size and/or use of iterative reconstruction technique. COMPARISON:  July 14, 2023, November 04, 2022 FINDINGS: Evaluation is limited by lack of IV contrast. CT CHEST FINDINGS Cardiovascular: Heart is enlarged. LEFT chest AICD. No pericardial effusion. Scattered atherosclerotic calcifications of the nonaneurysmal thoracic aorta. Mediastinum/Nodes: Visualized thyroid  is unremarkable. No axillary adenopathy. Prominent mediastinal  lymph nodes with representative pretracheal lymph node measuring 11 mm in the short axis (series 3, image 22). Lungs/Pleura: Small bilateral pleural effusions. No pneumothorax. Near consolidative ground-glass opacities encompassing the majority of the LEFT upper lobe with scattered ground-glass opacities and interlobular septal thickening involving the additional lobes. Musculoskeletal: No acute osseous abnormality. Similar sclerotic focus of the spinous process of T7, likely a bone island. Sequela of remote fracture of the sternum. CT ABDOMEN PELVIS FINDINGS Hepatobiliary: Unremarkable noncontrast appearance of the liver and gallbladder. No extrahepatic biliary ductal dilation. Pancreas: No peripancreatic fat stranding. Spleen: Lobulated in appearance with peripheral calcifications, likely reflecting sequela of prior trauma or injury. Adrenals/Urinary Tract: Adrenal glands are unremarkable. No hydronephrosis. No obstructing nephrolithiasis. Mild circumferential wall prominence with vague adjacent fat stranding of the bladder. Stomach/Bowel: No evidence of bowel obstruction. Appendix is normal. Patulous esophagus with a small amount of layering fluid and a likely small hiatal hernia. Vascular/Lymphatic: Vascular calcifications, age advanced. LEFT inguinal stent graft. No new lymphadenopathy. Reproductive: Uterus is present. Other: Small volume free fluid in the pelvis.  Musculoskeletal: No acute or significant osseous findings. IMPRESSION: 1. Near consolidative ground-glass opacities encompassing the majority of the LEFT upper lobe with scattered ground-glass opacities and interlobular septal thickening involving the additional lobes. Findings are favored to reflect pulmonary edema. Differential considerations include superimposed infection on a background of edema or atypical infection. 2. Small bilateral pleural effusions. 3. Mild circumferential wall prominence with vague adjacent fat stranding of the bladder. Recommend correlation with urinalysis to exclude cystitis. 4. Small volume free fluid in the pelvis, nonspecific. Aortic Atherosclerosis (ICD10-I70.0). Electronically Signed   By: Corean Salter M.D.   On: 09/05/2024 09:56    Impression:   Nausea, vomiting. Coffee ground emesis. History gastroparesis. Multiple similar presentations, multiple endoscopies, esophagitis.  Plan:   Supportive care nausea, vomiting. IV pantoprazole  40 mg bid x 2 days, then po bid indefinitely. Not good candidate for sucralfate  given ESRD. Given multiple prior similar presentations for same thing with esophagitis on endoscopy, do not think she needs yet another endoscopy. Antiemetics. Gastroparesis diet as tolerated. [facility] will follow.   LOS: 0 days   Zanae Kuehnle M  09/05/2024, 12:39 PM  Cell [phone] If no answer or after 5 PM call [phone]

## 2024-09-05 NOTE — Consult Note (Signed)
 [facility] Renal Consultation Note    Indication for Consultation:  Management of ESRD/hemodialysis; anemia, hypertension/volume and secondary hyperparathyroidism  HPI: [name] is a 54 y.o. female with ESRD on HD, T1DM, diabetic gastroparesis, HTN, HFrEF, who  is admitted with nausea/vomiting/hematemesis. On arrival to the ED she was found to be hypertensive and hypoxic requiring 4L [place]. Bloody emesis witnessed in the ED.  CT chest showing Near consolidative ground-glass opacities encompassing the majority of the left upper lobe with scattered ground-glass opacities and interlobular septal thickening involving the additional lobes. Findings are favored to reflect pulmonary edema. Na 138, K 3.7, BUN 52, WBC 13.1, Hgb 11.2. GI was consulted. Nephrology consulted for dialysis.   Seen and examined in the ED. Blood pressure has improved. Appears comfortable on oxygen. Says the nausea is a little better. She feels like she had too much fluid taken off at dialysis Friday. She thinks that triggered the vomiting. She has been coughing. She denies fevers/chills, chest pain, shortness of breath.   Dialysis MWF at [facility]. Hasn't missed dialysis. Last HD 1/30. She completed a full treatment and left at her dry weight. Thigh AVG in place. No issues reported with graft.   Past Medical History:  Diagnosis Date   AICD (automatic cardioverter/defibrillator) present    [employer]   Anemia    Anxiety    Arthritis    Asthma    CHF (congestive heart failure) (HCC)    Chronic diastolic CHF (congestive heart failure) (HCC) 12/14/2019   Depression    Diabetic ulcer of left foot (HCC) 05/12/2013   ESRD on hemodialysis (HCC)    MWF at [facility]   Gastroparesis    Generalized abdominal pain    GERD (gastroesophageal reflux disease)    History of chicken pox    Loss of weight 12/02/2019   Migraines    Mixed hyperlipidemia 09/05/2022   Mood disorder    anxiety   Myocardial infarction [facility])     NSTEMI (non-ST elevated myocardial infarction) (HCC) 05/13/2022   Prurigo nodularis    with diabetic dermopathy   Type 1 diabetes mellitus (HCC) 05/23/2022   Type 1 diabetes, uncontrolled, with neuropathy    [place]   Ulcers of both lower legs (HCC) 02/20/2014   Past Surgical History:  Procedure Laterality Date   A/V SHUNT INTERVENTION N/A 11/06/2023   Procedure: A/V SHUNT INTERVENTION;  Surgeon: Melia Lynwood ORN, MD;  Location: [facility] INVASIVE CV LAB;  Service: Cardiovascular;  Laterality: N/A;   AV FISTULA PLACEMENT Left 10/01/2022   Procedure: ARTERIOVENOUS (AV)FISTULA CREATION;  Surgeon: Oris Krystal FALCON, MD;  Location: [facility] ORS;  Service: Vascular;  Laterality: Left;   AV FISTULA PLACEMENT Right 11/19/2022   Procedure: INSERTION OF RIGHT ARM ARTERIOVENOUS (AV) GORE-TEX GRAFT;  Surgeon: Eliza Lonni RAMAN, MD;  Location: [facility] OR;  Service: Vascular;  Laterality: Right;   AV FISTULA PLACEMENT Right 07/17/2023   Procedure: INSERTION OF ARTERIOVENOUS (AV) GOR-TEX GRAFT RIGHT ARM;  Surgeon: Magda Debby SAILOR, MD;  Location: [facility] OR;  Service: Vascular;  Laterality: Right;   AV FISTULA PLACEMENT Left 09/18/2023   Procedure: INSERTION OF ARTERIOVENOUS (AV) GORE-TEX GRAFT THIGH;  Surgeon: Magda Debby SAILOR, MD;  Location: [facility] OR;  Service: Vascular;  Laterality: Left;   BIOPSY  08/11/2020   Procedure: BIOPSY;  Surgeon: Aneita Gwendlyn DASEN, MD;  Location: [facility] ENDOSCOPY;  Service: Endoscopy;;   BIOPSY  04/02/2022   Procedure: BIOPSY;  Surgeon: Charlanne Groom, MD;  Location: [facility] ENDOSCOPY;  Service: Gastroenterology;;  ESOPHAGOGASTRODUODENOSCOPY (EGD) WITH PROPOFOL  N/A 08/11/2020   Procedure: ESOPHAGOGASTRODUODENOSCOPY (EGD) WITH PROPOFOL ;  Surgeon: Aneita Gwendlyn DASEN, MD;  Location: [facility]OSCOPY;  Service: Endoscopy;  Laterality: N/A;   ESOPHAGOGASTRODUODENOSCOPY (EGD) WITH PROPOFOL  N/A 04/02/2022   Procedure: ESOPHAGOGASTRODUODENOSCOPY (EGD) WITH PROPOFOL ;  Surgeon: Charlanne Groom, MD;  Location: [facility]OSCOPY;  Service:  Gastroenterology;  Laterality: N/A;   ESOPHAGOGASTRODUODENOSCOPY (EGD) WITH PROPOFOL  N/A 05/23/2022   Procedure: ESOPHAGOGASTRODUODENOSCOPY (EGD) WITH PROPOFOL ;  Surgeon: Jinny Carmine, MD;  Location: [facility] ENDOSCOPY;  Service: Endoscopy;  Laterality: N/A;   ESOPHAGOGASTRODUODENOSCOPY (EGD) WITH PROPOFOL  N/A 07/15/2023   Procedure: ESOPHAGOGASTRODUODENOSCOPY (EGD) WITH PROPOFOL ;  Surgeon: Dianna Specking, MD;  Location: [facility] ENDOSCOPY;  Service: Gastroenterology;  Laterality: N/A;   ICD IMPLANT N/A 04/09/2022   Procedure: ICD IMPLANT;  Surgeon: Inocencio Soyla Lunger, MD;  Location: [facility] INVASIVE CV LAB;  Service: Cardiovascular;  Laterality: N/A;   IR FLUORO GUIDE CV LINE RIGHT  10/07/2022   IR US  GUIDE VASC ACCESS RIGHT  10/07/2022   PERIPHERAL VASCULAR THROMBECTOMY Left 03/24/2024   Procedure: PERIPHERAL VASCULAR THROMBECTOMY;  Surgeon: Pearline Norman RAMAN, MD;  Location: [facility];  Service: Cardiovascular;  Laterality: Left;   treadmill stress test  01/2013   WNL, low risk study   VENOUS STENT  03/24/2024   Procedure: VENOUS STENT;  Surgeon: Pearline Norman RAMAN, MD;  Location: [facility];  Service: Cardiovascular;;  left upper thigh graft   Family History  Problem Relation Age of Onset   Diabetes Mother        type 2   Hypertension Mother    Thyroid  disease Mother    Bipolar disorder Mother    Heart disease Mother    Calcium  disorder Mother    Cancer Maternal Grandmother        Breast, stomach   Cancer Paternal Grandmother        stomach, lung (smoker)   Diabetes Paternal Grandmother    Diabetes Paternal Grandfather    Heart failure Sister    Diabetes Sister    Stroke Maternal Aunt    Cancer Maternal Uncle        prostate   CAD Maternal Aunt        stents   Cancer Maternal Aunt 27       ovarian   Social History:  reports that she has never smoked. She has never used smokeless tobacco. She reports that she does not currently use alcohol . She reports that she does not use  drugs. Allergies[1] Prior to Admission medications  Medication Sig Start Date End Date Taking? Authorizing Provider  acetaminophen  (TYLENOL ) 500 MG tablet Take 1,000 mg by mouth every 8 (eight) hours as needed for moderate pain or mild pain.   Yes [provider]  albuterol  (PROAIR  HFA) 108 (90 Base) MCG/ACT inhaler Inhale 2 puffs into the lungs every 6 (six) hours as needed for wheezing or shortness of breath. 07/03/20  Yes Lunger Elsie BROCKS, PA-C  calcium  carbonate (TUMS EX) 750 MG chewable tablet Chew 2 tablets by mouth daily as needed for heartburn.   Yes [provider]  carvedilol  (COREG ) 6.25 MG tablet Take 1 tablet (6.25 mg total) by mouth 2 (two) times daily. 12/04/23  Yes Zenaida Morene PARAS, MD  ferric citrate (AURYXIA) 1 GM 210 MG(Fe) tablet Take 210-630 mg by mouth 3 (three) times daily with meals.   Yes [provider]  FLUoxetine  (PROZAC ) 10 MG tablet Take 10 mg by mouth daily. 07/06/24  Yes [provider]  gabapentin  (  NEURONTIN ) 100 MG capsule Take 100 mg by mouth at bedtime.   Yes [provider]  hydrOXYzine  (ATARAX ) 50 MG tablet Take 50 mg by mouth 3 (three) times daily as needed for anxiety.   Yes [provider]  insulin  aspart (NOVOLOG ) 100 UNIT/ML injection Inject 120 Units into the skin See admin instructions. Patient uses this with the Omnipod insulin  pump  its vary according to blood sugar. Loads 120 units every 3 days. 05/02/23  Yes Ghimire, Donalda HERO, MD  Insulin  Disposable Pump (OMNIPOD DASH PODS, GEN 4,) MISC Place 1 Applicatorful onto the skin See admin instructions. Every three days   Yes [provider]  LORazepam  (ATIVAN ) 0.5 MG tablet Take 1 tablet (0.5 mg total) by mouth every 8 (eight) hours as needed for anxiety (Refractory nausea and vomiting). 07/17/22  Yes Fairy Frames, MD  metoCLOPramide  (REGLAN ) 10 MG tablet Take 10 mg by mouth every 8 (eight) hours as needed for vomiting or nausea.   Yes [provider]  midodrine  (PROAMATINE ) 5 MG tablet Take 1 tablet once daily as needed on dialysis days 08/09/24  Yes Zenaida Morene PARAS, MD  mupirocin  ointment (BACTROBAN ) 2 % Apply 1 Application topically 2 (two) times daily. 02/03/24  Yes Petery, John, DPM  nitroGLYCERIN  (NITROSTAT ) 0.4 MG SL tablet Place 1 tablet (0.4 mg total) under the tongue every 5 (five) minutes as needed for chest pain. 09/05/22 08/09/25 Yes Chandrasekhar, Mahesh A, MD  pantoprazole  (PROTONIX ) 40 MG tablet Take 1 tablet (40 mg total) by mouth 2 (two) times daily. 02/05/24 01/30/25 Yes Zenaida Morene PARAS, MD  polyethylene glycol (MIRALAX  / GLYCOLAX ) 17 g packet Take 17 g by mouth daily as needed for moderate constipation.   Yes [provider]  prochlorperazine  (COMPAZINE ) 5 MG tablet Take 1 tablet (5 mg total) by mouth every 6 (six) hours as needed for nausea or vomiting. 05/01/23  Yes Ghimire, Donalda HERO, MD  sacubitril -valsartan  (ENTRESTO ) 24-26 MG Take on non dialysis days Patient taking differently: Take 1 tablet by mouth See admin instructions. Take one tablet by mouth on Tuesdays, Thursdays, Saturdays, Sundays per patient 11/13/23  Yes Zenaida Morene PARAS, MD  TRESIBA  FLEXTOUCH 100 UNIT/ML FlexTouch Pen Inject into the skin as needed (when insulin  pump is not working). 06/02/23  Yes [provider]  scopolamine  (TRANSDERM-SCOP) 1 MG/3DAYS Place 1 patch onto the skin every 3 (three) days.    [provider]  tobramycin  (TOBREX ) 0.3 % ophthalmic solution Place 1 drop into both eyes See admin instructions. Instill 1 drop into both eyes once every 8 weeks before eye injections 04/21/23   [provider]  torsemide  (DEMADEX ) 20 MG tablet Take on non dialysis days Patient taking differently: Take 20 mg by mouth See admin instructions. Take one tablet by  mouth on Tuesdays, Thursdays, Saturdays and Sundays per patient 11/13/23   Zenaida Morene PARAS, MD   Current Facility-Administered Medications  Medication  Dose Route Frequency Provider Last Rate Last Admin   acetaminophen  (TYLENOL ) tablet 650 mg  650 mg Oral Q6H PRN Smith, Rondell A, MD       Or   acetaminophen  (TYLENOL ) suppository 650 mg  650 mg Rectal Q6H PRN Smith, Rondell A, MD       albuterol  (PROVENTIL ) (2.5 MG/3ML) 0.083% nebulizer solution 2.5 mg  2.5 mg Nebulization Q6H PRN Smith, Rondell A, MD       carvedilol  (COREG ) tablet 6.25 mg  6.25 mg Oral BID Claudene Maximino LABOR, MD  cefTRIAXone  (ROCEPHIN ) 1 g in sodium chloride  0.9 % 100 mL IVPB  1 g Intravenous Q24H Claudene, Rondell A, MD 200 mL/hr at 09/05/24 1202 1 g at 09/05/24 1202   doxycycline  (VIBRAMYCIN ) 100 mg in sodium chloride  0.9 % 250 mL IVPB  100 mg Intravenous Q12H Smith, Rondell A, MD       [START ON 09/06/2024] FLUoxetine  (PROZAC ) capsule 10 mg  10 mg Oral Daily Smith, Rondell A, MD       gabapentin  (NEURONTIN ) capsule 100 mg  100 mg Oral QHS Smith, Rondell A, MD       hydrALAZINE  (APRESOLINE ) injection 10 mg  10 mg Intravenous Q4H PRN Smith, Rondell A, MD       hydrOXYzine  (ATARAX ) tablet 50 mg  50 mg Oral TID PRN Smith, Rondell A, MD       insulin  pump   Subcutaneous TID WC, HS, 0200 Smith, Rondell A, MD       mupirocin  ointment (BACTROBAN ) 2 % 1 Application  1 Application Topical BID Claudene Reeves A, MD       pantoprazole  (PROTONIX ) injection 40 mg  40 mg Intravenous Q12H Smith, Rondell A, MD   40 mg at 09/05/24 1158   [START ON 09/07/2024] sacubitril -valsartan  (ENTRESTO ) 24-26 mg per tablet  1 tablet Oral Once per day on Sunday Tuesday Thursday Saturday Claudene Reeves LABOR, MD       [START ON 09/06/2024] scopolamine  (TRANSDERM-SCOP) 1 MG/3DAYS 1 mg  1 patch Transdermal Q72H Smith, Rondell A, MD       sodium chloride  flush (NS) 0.9 % injection 3 mL  3 mL Intravenous Q12H Smith, Rondell A, MD       [START ON 09/07/2024] torsemide  (DEMADEX ) tablet 20 mg  20 mg Oral Once per day on Sunday Tuesday Thursday Saturday Claudene Reeves LABOR, MD       trimethobenzamide  (TIGAN ) injection 200 mg  200  mg Intramuscular Q6H PRN Claudene Reeves LABOR, MD       Current Outpatient Medications  Medication Sig Dispense Refill   acetaminophen  (TYLENOL ) 500 MG tablet Take 1,000 mg by mouth every 8 (eight) hours as needed for moderate pain or mild pain.     albuterol  (PROAIR  HFA) 108 (90 Base) MCG/ACT inhaler Inhale 2 puffs into the lungs every 6 (six) hours as needed for wheezing or shortness of breath. 6.7 g 1   calcium  carbonate (TUMS EX) 750 MG chewable tablet Chew 2 tablets by mouth daily as needed for heartburn.     carvedilol  (COREG ) 6.25 MG tablet Take 1 tablet (6.25 mg total) by mouth 2 (two) times daily. 180 tablet 3   ferric citrate (AURYXIA) 1 GM 210 MG(Fe) tablet Take 210-630 mg by mouth 3 (three) times daily with meals.     FLUoxetine  (PROZAC ) 10 MG tablet Take 10 mg by mouth daily.     gabapentin  (NEURONTIN ) 100 MG capsule Take 100 mg by mouth at bedtime.     hydrOXYzine  (ATARAX ) 50 MG tablet Take 50 mg by mouth 3 (three) times daily as needed for anxiety.     insulin  aspart (NOVOLOG ) 100 UNIT/ML injection Inject 120 Units into the skin See admin instructions. Patient uses this with the Omnipod insulin  pump  its vary according to blood sugar. Loads 120 units every 3 days.     Insulin  Disposable Pump (OMNIPOD DASH PODS, GEN 4,) MISC Place 1 Applicatorful onto the skin See admin instructions. Every three days     LORazepam  (ATIVAN ) 0.5 MG tablet Take 1 tablet (  0.5 mg total) by mouth every 8 (eight) hours as needed for anxiety (Refractory nausea and vomiting). 30 tablet 0   metoCLOPramide  (REGLAN ) 10 MG tablet Take 10 mg by mouth every 8 (eight) hours as needed for vomiting or nausea.     midodrine  (PROAMATINE ) 5 MG tablet Take 1 tablet once daily as needed on dialysis days 15 tablet 1   mupirocin  ointment (BACTROBAN ) 2 % Apply 1 Application topically 2 (two) times daily. 22 g 0   nitroGLYCERIN  (NITROSTAT ) 0.4 MG SL tablet Place 1 tablet (0.4 mg total) under the tongue every 5 (five) minutes as  needed for chest pain. 90 tablet 3   pantoprazole  (PROTONIX ) 40 MG tablet Take 1 tablet (40 mg total) by mouth 2 (two) times daily. 60 tablet 11   polyethylene glycol (MIRALAX  / GLYCOLAX ) 17 g packet Take 17 g by mouth daily as needed for moderate constipation.     prochlorperazine  (COMPAZINE ) 5 MG tablet Take 1 tablet (5 mg total) by mouth every 6 (six) hours as needed for nausea or vomiting. 15 tablet 0   sacubitril -valsartan  (ENTRESTO ) 24-26 MG Take on non dialysis days (Patient taking differently: Take 1 tablet by mouth See admin instructions. Take one tablet by mouth on Tuesdays, Thursdays, Saturdays, Sundays per patient) 45 tablet 11   TRESIBA  FLEXTOUCH 100 UNIT/ML FlexTouch Pen Inject into the skin as needed (when insulin  pump is not working).     scopolamine  (TRANSDERM-SCOP) 1 MG/3DAYS Place 1 patch onto the skin every 3 (three) days.     tobramycin  (TOBREX ) 0.3 % ophthalmic solution Place 1 drop into both eyes See admin instructions. Instill 1 drop into both eyes once every 8 weeks before eye injections     torsemide  (DEMADEX ) 20 MG tablet Take on non dialysis days (Patient taking differently: Take 20 mg by mouth See admin instructions. Take one tablet by  mouth on Tuesdays, Thursdays, Saturdays and Sundays per patient) 45 tablet 11     ROS: As per HPI otherwise negative.  Physical Exam: Vitals:   09/05/24 0955 09/05/24 0956 09/05/24 0957 09/05/24 1236  BP:      Pulse: 88 88 86   Resp: (!) 33 (!) 36 11   Temp:    98.5 F (36.9 C)  TempSrc:    Oral  SpO2: 91% (!) 87% 97%   Weight:      Height:         General: Appears comfortable,on nasal oxygen, nad  Head: NCAT sclera not icteric MMM CV: Regular rate, no murmur, no rub  Pulm: normal respiratory effort, faint crackles  Abdomen: non-tender, no masses  Lower extremities: no edema  Neuro: A & O X 3. Moves all extremities spontaneously. Psych:  Responds to questions appropriately with a normal affect. Dialysis Access: L thigh  AVG   Labs: Basic Metabolic Panel: Recent Labs  Lab 09/05/24 0850 09/05/24 0859  NA 139 138  K 3.9 3.7  CL 99 100  CO2 23  --   GLUCOSE 296* 300*  BUN 51* 52*  CREATININE 6.51* 7.40*  CALCIUM  9.1  --    Liver Function Tests: Recent Labs  Lab 09/05/24 0850  AST 16  ALT <5  ALKPHOS 89  BILITOT 1.0  PROT 7.5  ALBUMIN  4.0   No results for input(s): LIPASE, AMYLASE in the last 168 hours. No results for input(s): AMMONIA in the last 168 hours. CBC: Recent Labs  Lab 09/05/24 0850 09/05/24 0859  WBC 13.1*  --   NEUTROABS 11.7*  --  HGB 10.2* 11.2*  HCT 30.2* 33.0*  MCV 91.2  --   PLT 155  --    Cardiac Enzymes: No results for input(s): CKTOTAL, CKMB, CKMBINDEX, TROPONINI in the last 168 hours. CBG: No results for input(s): GLUCAP in the last 168 hours. Iron Studies: No results for input(s): IRON, TIBC, TRANSFERRIN, FERRITIN in the last 72 hours. Studies/Results: DG Chest Port 1 View Result Date: 09/05/2024 CLINICAL DATA:  Abdominal pain and hypertension. EXAM: PORTABLE CHEST 1 VIEW COMPARISON:  07/14/2023 FINDINGS: Left-sided pacemaker unchanged. Lungs are adequately inflated demonstrate airspace consolidation over the lingula and left upper lobe with subtle hazy perihilar/infrahilar opacification bilaterally. No effusion. Borderline cardiomegaly. Remainder of the exam is unchanged. IMPRESSION: Airspace consolidation over the lingula and left upper lobe with subtle hazy perihilar/infrahilar opacification bilaterally. Findings likely due to infection, although mild concomitant underlying edema is possible Electronically Signed   By: Toribio Agreste M.D.   On: 09/05/2024 09:58   CT CHEST ABDOMEN PELVIS WO CONTRAST Result Date: 09/05/2024 CLINICAL DATA:  Abdominal pain, acute, nonlocalized EXAM: CT CHEST, ABDOMEN AND PELVIS WITHOUT CONTRAST TECHNIQUE: Multidetector CT imaging of the chest, abdomen and pelvis was performed following the standard protocol  without IV contrast. RADIATION DOSE REDUCTION: This exam was performed according to the departmental dose-optimization program which includes automated exposure control, adjustment of the mA and/or kV according to patient size and/or use of iterative reconstruction technique. COMPARISON:  July 14, 2023, November 04, 2022 FINDINGS: Evaluation is limited by lack of IV contrast. CT CHEST FINDINGS Cardiovascular: Heart is enlarged. LEFT chest AICD. No pericardial effusion. Scattered atherosclerotic calcifications of the nonaneurysmal thoracic aorta. Mediastinum/Nodes: Visualized thyroid  is unremarkable. No axillary adenopathy. Prominent mediastinal lymph nodes with representative pretracheal lymph node measuring 11 mm in the short axis (series 3, image 22). Lungs/Pleura: Small bilateral pleural effusions. No pneumothorax. Near consolidative ground-glass opacities encompassing the majority of the LEFT upper lobe with scattered ground-glass opacities and interlobular septal thickening involving the additional lobes. Musculoskeletal: No acute osseous abnormality. Similar sclerotic focus of the spinous process of T7, likely a bone island. Sequela of remote fracture of the sternum. CT ABDOMEN PELVIS FINDINGS Hepatobiliary: Unremarkable noncontrast appearance of the liver and gallbladder. No extrahepatic biliary ductal dilation. Pancreas: No peripancreatic fat stranding. Spleen: Lobulated in appearance with peripheral calcifications, likely reflecting sequela of prior trauma or injury. Adrenals/Urinary Tract: Adrenal glands are unremarkable. No hydronephrosis. No obstructing nephrolithiasis. Mild circumferential wall prominence with vague adjacent fat stranding of the bladder. Stomach/Bowel: No evidence of bowel obstruction. Appendix is normal. Patulous esophagus with a small amount of layering fluid and a likely small hiatal hernia. Vascular/Lymphatic: Vascular calcifications, age advanced. LEFT inguinal stent graft. No new  lymphadenopathy. Reproductive: Uterus is present. Other: Small volume free fluid in the pelvis. Musculoskeletal: No acute or significant osseous findings. IMPRESSION: 1. Near consolidative ground-glass opacities encompassing the majority of the LEFT upper lobe with scattered ground-glass opacities and interlobular septal thickening involving the additional lobes. Findings are favored to reflect pulmonary edema. Differential considerations include superimposed infection on a background of edema or atypical infection. 2. Small bilateral pleural effusions. 3. Mild circumferential wall prominence with vague adjacent fat stranding of the bladder. Recommend correlation with urinalysis to exclude cystitis. 4. Small volume free fluid in the pelvis, nonspecific. Aortic Atherosclerosis (ICD10-I70.0). Electronically Signed   By: Corean Salter M.D.   On: 09/05/2024 09:56    Dialysis Orders:  [facility] M[facility]  Time: 3:45  EDW: 84 kg  Flows: 400/A1.5x Bath: 2K/2Ca  Access:  AVG  -Heparin : 3000 units -Mircera 75 q 2wks (last 1/19) -Venofer 50 q wk  -Calcitriol  1.25 mcg TIW   Assessment/Plan: Nausea/vomiting/hematemesis. H/o gastroparesis. GI consulted. Supportive management.  Pneumonia vs pulmonary edema. Antibiotics per primary team. UF with HD for additional volume removal.  ESRD.  HD MWF.  Limited HD staffing today. Plan for HD 1st shift Monday.  Hypertension. Resume home meds as able.  Volume. Push UF with HD tomorrow 3-4L  Anemia. Hgb stable. No ESA needs. Follow trend.  Metabolic bone disease.  Resume Auryxia binder when eating. Follow Ca/Phos trend.  T1DM. On insulin  pump. Per primary.   Maisie Ronnald Acosta PA-C [facility] 09/05/2024, 2:10 PM        [1]  Allergies Allergen Reactions   Atorvastatin  Other (See Comments)    Dizziness    Dulaglutide Nausea And Vomiting and Other (See Comments)    Caused pancreatitis    Hydrocodone      Other Reaction(s): vomiting   Ivp Dye  [Iodinated Contrast Media] Hives, Itching and Nausea And Vomiting    tremors   Metoclopramide  Other (See Comments)    Possible movement disorder or tardive dyskinesia while on reglan  Long term   Other Itching    Peas and carrots   Sertraline Nausea And Vomiting   Technetium Tc 99m  Tetrofosmin      Other Reaction(s): Unknown   Vancomycin  Itching and Swelling   Zofran  [Ondansetron ] Nausea And Vomiting and Other (See Comments)    Causes nausea vomiting to worsen / doesn't really work for patient.   Amoxicillin Itching and Rash   Lantus  [Insulin  Glargine] Itching and Rash    Has tolerated insulin  glargine many times with no reported issues   Miconazole Nitrate Nausea And Vomiting and Rash

## 2024-09-05 NOTE — ED Triage Notes (Addendum)
 Pt BIB [facility] for vomiting since Friday, worsening this morning at 1am. Vomit became blood-tinged at that time. EMS reports about 100 mL of emesis in the past hour. Patient actively vomiting dark red emesis on arrival. Hx of gastroparesis.    Allergic to Zofran , took suppository Promethegan  at home. MWF dialysis, has not missed   200/100, Hr 104 ST, CBG 375 (6 units of insulin  0630), 98% RA  24 R hand

## 2024-09-06 DIAGNOSIS — K3184 Gastroparesis: Secondary | ICD-10-CM | POA: Diagnosis not present

## 2024-09-06 DIAGNOSIS — L97512 Non-pressure chronic ulcer of other part of right foot with fat layer exposed: Secondary | ICD-10-CM | POA: Diagnosis not present

## 2024-09-06 DIAGNOSIS — J9601 Acute respiratory failure with hypoxia: Secondary | ICD-10-CM | POA: Diagnosis not present

## 2024-09-06 DIAGNOSIS — E1065 Type 1 diabetes mellitus with hyperglycemia: Secondary | ICD-10-CM | POA: Diagnosis not present

## 2024-09-06 DIAGNOSIS — K920 Hematemesis: Secondary | ICD-10-CM | POA: Diagnosis not present

## 2024-09-06 DIAGNOSIS — E870 Hyperosmolality and hypernatremia: Secondary | ICD-10-CM

## 2024-09-06 DIAGNOSIS — E1069 Type 1 diabetes mellitus with other specified complication: Secondary | ICD-10-CM | POA: Diagnosis not present

## 2024-09-06 LAB — BASIC METABOLIC PANEL WITH GFR
Anion gap: 16 — ABNORMAL HIGH (ref 5–15)
BUN: 58 mg/dL — ABNORMAL HIGH (ref 6–20)
CO2: 24 mmol/L (ref 22–32)
Calcium: 8.9 mg/dL (ref 8.9–10.3)
Chloride: 97 mmol/L — ABNORMAL LOW (ref 98–111)
Creatinine, Ser: 7.86 mg/dL — ABNORMAL HIGH (ref 0.44–1.00)
GFR, Estimated: 6 mL/min — ABNORMAL LOW
Glucose, Bld: 211 mg/dL — ABNORMAL HIGH (ref 70–99)
Potassium: 3.8 mmol/L (ref 3.5–5.1)
Sodium: 137 mmol/L (ref 135–145)

## 2024-09-06 LAB — CBC
HCT: 27.1 % — ABNORMAL LOW (ref 36.0–46.0)
Hemoglobin: 9 g/dL — ABNORMAL LOW (ref 12.0–15.0)
MCH: 29.9 pg (ref 26.0–34.0)
MCHC: 33.2 g/dL (ref 30.0–36.0)
MCV: 90 fL (ref 80.0–100.0)
Platelets: 151 10*3/uL (ref 150–400)
RBC: 3.01 MIL/uL — ABNORMAL LOW (ref 3.87–5.11)
RDW: 17.1 % — ABNORMAL HIGH (ref 11.5–15.5)
WBC: 7.6 10*3/uL (ref 4.0–10.5)
nRBC: 0 % (ref 0.0–0.2)

## 2024-09-06 LAB — URINALYSIS, ROUTINE W REFLEX MICROSCOPIC
Bilirubin Urine: NEGATIVE
Glucose, UA: 50 mg/dL — AB
Hgb urine dipstick: NEGATIVE
Ketones, ur: NEGATIVE mg/dL
Leukocytes,Ua: NEGATIVE
Nitrite: NEGATIVE
Protein, ur: 100 mg/dL — AB
Specific Gravity, Urine: 1.013 (ref 1.005–1.030)
pH: 7 (ref 5.0–8.0)

## 2024-09-06 LAB — GLUCOSE, CAPILLARY
Glucose-Capillary: 204 mg/dL — ABNORMAL HIGH (ref 70–99)
Glucose-Capillary: 203 mg/dL — ABNORMAL HIGH (ref 70–99)
Glucose-Capillary: 187 mg/dL — ABNORMAL HIGH (ref 70–99)

## 2024-09-06 MED ORDER — HEPARIN SODIUM (PORCINE) 1000 UNIT/ML DIALYSIS: 1000.0000 [IU] | INTRAMUSCULAR | Status: DC | PRN

## 2024-09-06 MED ORDER — PANTOPRAZOLE SODIUM 40 MG PO TBEC
40.0000 mg | DELAYED_RELEASE_TABLET | Freq: Two times a day (BID) | ORAL | Status: DC
  Administered 2024-09-07: 40 mg via ORAL
  Filled 2024-09-06 (×2): qty 1

## 2024-09-06 MED ORDER — LIDOCAINE-PRILOCAINE 2.5-2.5 % EX CREA: 1.0000 | TOPICAL_CREAM | CUTANEOUS | Status: DC | PRN

## 2024-09-06 MED ORDER — ANTICOAGULANT SODIUM CITRATE 4% (200MG/5ML) IV SOLN: 5.0000 mL | Status: DC | PRN

## 2024-09-06 MED ORDER — ALTEPLASE 2 MG IJ SOLR: 2.0000 mg | Freq: Once | INTRAMUSCULAR | Status: DC | PRN

## 2024-09-06 MED ORDER — PENTAFLUOROPROP-TETRAFLUOROETH EX AERO: 1.0000 | INHALATION_SPRAY | CUTANEOUS | Status: DC | PRN

## 2024-09-06 MED ORDER — LIDOCAINE HCL (PF) 1 % IJ SOLN: 5.0000 mL | INTRAMUSCULAR | Status: DC | PRN

## 2024-09-06 MED ORDER — DOXYCYCLINE HYCLATE 100 MG PO TABS
100.0000 mg | ORAL_TABLET | Freq: Two times a day (BID) | ORAL | Status: DC
  Administered 2024-09-06 – 2024-09-07 (×2): 100 mg via ORAL
  Filled 2024-09-06 (×3): qty 1

## 2024-09-06 MED ORDER — INSULIN GLARGINE-YFGN 100 UNIT/ML ~~LOC~~ SOLN
15.0000 [IU] | Freq: Every day | SUBCUTANEOUS | Status: DC
  Administered 2024-09-06 – 2024-09-07 (×2): 15 [IU] via SUBCUTANEOUS
  Filled 2024-09-06 (×2): qty 0.15

## 2024-09-06 MED ORDER — STERILE WATER FOR INJECTION IJ SOLN
INTRAMUSCULAR | Status: AC
  Administered 2024-09-06: 10 mL
  Filled 2024-09-06: qty 10

## 2024-09-06 NOTE — Plan of Care (Signed)

## 2024-09-06 NOTE — Inpatient Diabetes Management (Addendum)
 Inpatient Diabetes Program Recommendations  AACE/ADA: New Consensus Statement on Inpatient Glycemic Control (2015)  Target Ranges:  Prepandial:   less than 140 mg/dL      Peak postprandial:   less than 180 mg/dL (1-2 hours)      Critically ill patients:  140 - 180 mg/dL   Lab Results  Component Value Date   GLUCAP 171 (H) 09/05/2024   HGBA1C 7.9 (H) 07/15/2023    Latest Reference Range & Units 09/05/24 14:22 09/05/24 16:39 09/05/24 18:14 09/05/24 21:26  Glucose-Capillary 70 - 99 mg/dL 864 (H) 837 (H) 848 (H) 171 (H)   Review of Glycemic Control  Diabetes history: DM1  Outpatient Diabetes medications:  Omnipod Insulin  Pump (Novolog )  Dexcom CGM   Current orders for Inpatient glycemic control:  Novolog  0-15 units TID + 0-5 units at bedtime   Inpatient Diabetes Program Recommendations:   Noted patient has Type 1 diabetes.  Patient requires Basal and Bolus insulin  to prevent DKA.   Per HD RN Delon Engel -  pt is A and O x 4 and she states no to the pump and dexcom in place   I have requested HD RN to assess for pump and check CBG.   Will discuss with patient and follow-up this afternoon.  Addendum @ 1:37pm:  Spoke with patient over the phone (diabetes coordinator not on [facility]). Patient states she has Type 1 Diabetes and is followed by Dr Faythe outpatient for diabetes management.   Patient states she was instructed by nurse when she arrived to the unit last night to remove her insulin  pump. Unfortunately, when insulin  pump was removed, basal insulin  was not ordered. Patient expressed concern because she has not received any insulin  since insulin  pump was removed.   Confirmed prior to admission, patient wore Omnipod Insulin  Pump and Dexcom CGM  (Auto mode) for diabetes control. Patient states if she experiences pump failure outpatient, she takes Tresiba  40 units daily and Novolog  Insulin  Sliding Scale.   Spoke with bedside RN, Grayce. No reason for removing  insulin  was given to her at hand-off report this morning. I explained to bedside RN that patient has Type 1 diabetes and needs Basal and Bolus insulin  if pump is removed. I also made her aware insulin  pumps can remain on patient as long as patient is appropriate and appropriate orders are placed by MD.   Since insulin  pump has been removed, please consider:  - Lantus  15 units daily  - Novolog  0-6 units Q4HRS   (Noted: Glargine is listed on allergy list. Spoke with pharmacist - Tresiba  is not in house at all. Pharmacy noted under allergy list it states has tolerated insulin  glargine multiple times without issue. Recommend Lantus  15 units daily.)    Thanks,  Lavanda Search, RN, MSN, [facility]  Inpatient Diabetes Coordinator  Pager [phone] (8a-5p)

## 2024-09-06 NOTE — Progress Notes (Signed)
" °   09/06/24 1228  Vitals  Temp 98.1 F (36.7 C)  Pulse Rate 86  Resp 14  BP (!) 127/55  SpO2 100 %  O2 Device Room Air  Weight 83 kg  Type of Weight Post-Dialysis  Post Treatment  Dialyzer Clearance Lightly streaked  Hemodialysis Intake (mL) 0 mL  Liters Processed 84  Fluid Removed (mL) 3600 mL  Tolerated HD Treatment Yes  AVG/AVF Arterial Site Held (minutes) 10 minutes  AVG/AVF Venous Site Held (minutes) 10 minutes   Received patient in bed to unit.  Alert and oriented.  Informed consent signed and in chart.   TX duration:3.5  Patient tolerated well.  Transported back to the room  Alert, without acute distress.  Hand-off given to patient's nurse.   Access used: L thigh graft Access issues: no complications  Total UF removed: 2300 Medication(s) given: none   [name] "

## 2024-09-06 NOTE — Progress Notes (Signed)
 Pt receives out-pt HD at [facility], MWF, 0935am chair time. Will continue to assist as needed.    Lavanda Jaslyn Bansal  Dialysis Navigator [phone]

## 2024-09-06 NOTE — Progress Notes (Signed)
 [name] 4:09 PM  Subjective: Patient doing better enjoyed most of her food has less pain no nausea vomiting no new complaints in her hospital computer chart reviewed and her case discussed with my partner Dr. Burnette and she is on Reglan  and pantoprazole  at home and hopefully she will go home soon but she is waiting on the wound specialist  Objective: Vital signs stable afebrile no acute distress abdomen is soft nontender  Assessment: Nausea vomiting history of gastroparesis improved no signs of further GI bleeding  Plan: Please let me know if I can be of any further assistance during this hospital stay otherwise can follow-up within the next month with her primary gastroenterologist Dr. Kriss  [facility]  office [phone] After 5PM or if no answer call [phone]

## 2024-09-06 NOTE — Progress Notes (Signed)
 " [facility] Progress Note   Subjective: Seen in dialysis unit. UF goal 4L. Feeling better this am. Nausea improving.   Objective Vitals:   09/06/24 0755 09/06/24 0839 09/06/24 0848 09/06/24 0900  BP: 138/78 (!) 146/106 (!) 154/86 (!) 160/85  Pulse: 89 87 86 90  Resp: 17 10 18 11   Temp: 98.1 F (36.7 C) 99.1 F (37.3 C)    TempSrc: Oral     SpO2: (!) 88% 97% 94% 98%  Weight:  86.2 kg 85.6 kg   Height:        Additional Objective Labs: Basic Metabolic Panel: Recent Labs  Lab 09/05/24 0850 09/05/24 0859 09/06/24 0358  NA 139 138 137  K 3.9 3.7 3.8  CL 99 100 97*  CO2 23  --  24  GLUCOSE 296* 300* 211*  BUN 51* 52* 58*  CREATININE 6.51* 7.40* 7.86*  CALCIUM  9.1  --  8.9   CBC: Recent Labs  Lab 09/05/24 0850 09/05/24 0859 09/05/24 1900 09/06/24 0358  WBC 13.1*  --   --  7.6  NEUTROABS 11.7*  --   --   --   HGB 10.2* 11.2* 10.7* 9.0*  HCT 30.2* 33.0* 32.6* 27.1*  MCV 91.2  --   --  90.0  PLT 155  --   --  151   Blood Culture    Component Value Date/Time   SDES BLOOD RIGHT ANTECUBITAL 10/21/2022 1856   SPECREQUEST  10/21/2022 1856    BOTTLES DRAWN AEROBIC AND ANAEROBIC Blood Culture results may not be optimal due to an inadequate volume of blood received in culture bottles   CULT  10/21/2022 1856    NO GROWTH 5 DAYS Performed at [facility], [address], [place], [place] [address]    REPTSTATUS 10/26/2022 FINAL 10/21/2022 1856    Physical Exam General: Alert, nad Heart: RRR Lungs: resp unlabored Abdomen: non-tender Extremities: no LE edema  Dialysis Access: Thigh AVG   Medications:  anticoagulant sodium citrate      cefTRIAXone  (ROCEPHIN )  IV Stopped (09/05/24 1300)   doxycycline  (VIBRAMYCIN ) IV 100 mg (09/06/24 0518)    carvedilol   6.25 mg Oral BID   Chlorhexidine  Gluconate Cloth  6 each Topical Q0600   FLUoxetine   10 mg Oral Daily   gabapentin   100 mg Oral QHS   insulin  aspart  0-15 Units Subcutaneous TID WC    insulin  aspart  0-5 Units Subcutaneous QHS   mupirocin  ointment  1 Application Topical BID   pantoprazole  (PROTONIX ) IV  40 mg Intravenous Q12H   [START ON 09/07/2024] sacubitril -valsartan   1 tablet Oral Once per day on Sunday Tuesday Thursday Saturday   scopolamine   1 patch Transdermal Q72H   sodium chloride  flush  3 mL Intravenous Q12H   [START ON 09/07/2024] torsemide   20 mg Oral Once per day on Sunday Tuesday Thursday Saturday    Dialysis Orders:  [facility] M[facility]  Time: 3:45  EDW: 84 kg  Flows: 400/A1.5x Bath: 2K/2Ca  Access: AVG  -Heparin : 3000 units -Mircera 75 q 2wks (last 1/19) -Venofer 50 q wk  -Calcitriol  1.25 mcg TIW     Assessment/Plan: Nausea/vomiting/hematemesis. H/o gastroparesis. GI consulted. Supportive management.  Pneumonia vs pulmonary edema. Antibiotics per primary team. UF with HD for additional volume removal.  ESRD.  HD MWF.  HD today Hypertension. Resume home meds as able.  Volume. Push UF with HD tomorrow 3-4L  Anemia. Hgb 9-10. On ESA as outpatient.  Follow trends  Metabolic bone disease.  Resume  Auryxia binder when eating. Follow Ca/Phos trend.  T1DM. On insulin  pump. Per primary.     Megan Ronnald Acosta PA-C [facility] 09/06/2024,9:35 AM        "

## 2024-09-07 ENCOUNTER — Other Ambulatory Visit (HOSPITAL_COMMUNITY): Payer: Self-pay

## 2024-09-07 DIAGNOSIS — J9601 Acute respiratory failure with hypoxia: Secondary | ICD-10-CM | POA: Diagnosis not present

## 2024-09-07 DIAGNOSIS — L97512 Non-pressure chronic ulcer of other part of right foot with fat layer exposed: Secondary | ICD-10-CM | POA: Diagnosis not present

## 2024-09-07 DIAGNOSIS — K920 Hematemesis: Secondary | ICD-10-CM | POA: Diagnosis not present

## 2024-09-07 DIAGNOSIS — K3184 Gastroparesis: Secondary | ICD-10-CM | POA: Diagnosis not present

## 2024-09-07 LAB — GLUCOSE, CAPILLARY: Glucose-Capillary: 281 mg/dL — ABNORMAL HIGH (ref 70–99)

## 2024-09-07 LAB — BASIC METABOLIC PANEL WITH GFR
Anion gap: 12 (ref 5–15)
BUN: 37 mg/dL — ABNORMAL HIGH (ref 6–20)
CO2: 29 mmol/L (ref 22–32)
Calcium: 9.4 mg/dL (ref 8.9–10.3)
Chloride: 95 mmol/L — ABNORMAL LOW (ref 98–111)
Creatinine, Ser: 6.36 mg/dL — ABNORMAL HIGH (ref 0.44–1.00)
GFR, Estimated: 7 mL/min — ABNORMAL LOW
Glucose, Bld: 263 mg/dL — ABNORMAL HIGH (ref 70–99)
Potassium: 3.9 mmol/L (ref 3.5–5.1)
Sodium: 136 mmol/L (ref 135–145)

## 2024-09-07 LAB — CBC
HCT: 31.5 % — ABNORMAL LOW (ref 36.0–46.0)
Hemoglobin: 10.5 g/dL — ABNORMAL LOW (ref 12.0–15.0)
MCH: 30 pg (ref 26.0–34.0)
MCHC: 33.3 g/dL (ref 30.0–36.0)
MCV: 90 fL (ref 80.0–100.0)
Platelets: 181 10*3/uL (ref 150–400)
RBC: 3.5 MIL/uL — ABNORMAL LOW (ref 3.87–5.11)
RDW: 16.9 % — ABNORMAL HIGH (ref 11.5–15.5)
WBC: 5.8 10*3/uL (ref 4.0–10.5)
nRBC: 0 % (ref 0.0–0.2)

## 2024-09-07 LAB — PROCALCITONIN: Procalcitonin: 0.57 ng/mL

## 2024-09-07 MED ORDER — PANTOPRAZOLE SODIUM 40 MG PO TBEC
40.0000 mg | DELAYED_RELEASE_TABLET | Freq: Two times a day (BID) | ORAL | 11 refills | Status: AC
  Filled 2024-09-07: qty 60, 30d supply, fill #0

## 2024-09-07 MED ORDER — CEFDINIR 300 MG PO CAPS
300.0000 mg | ORAL_CAPSULE | Freq: Every day | ORAL | 0 refills | Status: AC
  Filled 2024-09-07: qty 2, 2d supply, fill #0

## 2024-09-07 MED ORDER — DOXYCYCLINE HYCLATE 100 MG PO TABS
100.0000 mg | ORAL_TABLET | Freq: Two times a day (BID) | ORAL | 0 refills | Status: AC
  Filled 2024-09-07: qty 4, 2d supply, fill #0

## 2024-09-07 MED ORDER — LORAZEPAM 0.5 MG PO TABS
0.5000 mg | ORAL_TABLET | Freq: Three times a day (TID) | ORAL | 0 refills | Status: AC | PRN
  Filled 2024-09-07: qty 30, 10d supply, fill #0

## 2024-09-07 MED ORDER — SCOPOLAMINE 1 MG/3DAYS TD PT72
1.0000 | MEDICATED_PATCH | TRANSDERMAL | 12 refills | Status: AC
  Filled 2024-09-07: qty 10, 30d supply, fill #0

## 2024-09-07 MED ORDER — METOCLOPRAMIDE HCL 10 MG PO TABS
10.0000 mg | ORAL_TABLET | Freq: Three times a day (TID) | ORAL | 1 refills | Status: AC | PRN
  Filled 2024-09-07: qty 30, 10d supply, fill #0

## 2024-09-07 NOTE — Plan of Care (Signed)
 [facility] Patient Discharge Orders- [facility] CLINIC: [place]  Patient's name: Vidya Bamford Admit/DC Dates: 09/05/2024 - 09/07/2024  Discharge Diagnoses: Hematemesis/known h/o esophagitis/gastroparesis. GI consulted.   Hypoxic respiratory failure. PNA vs pulm edema. Completing course of PO antibiotics    Outpatient Dialysis Orders:  -Heparin : No change  -EDW:  83 kg   -Bath: No change   Anemia Aranesp : Given: --   Date of last dose/amount: --   PRBC's Given: -- Date/# of units: -- ESA dose for discharge: Mircera 75 mcg IV q 2 wks   Recent Labs  Lab 09/05/24 0850 09/05/24 0859 09/07/24 0532  HGB 10.2*   < > 10.5*  K 3.9   < > 3.9  CALCIUM  9.1   < > 9.4  ALBUMIN  4.0  --   --    < > = values in this interval not displayed.   Access intervention/Change:  --   Medications: -IV Antibiotics: --- -Anticoagulation: ---   OTHER/APPTS/LABS   Completed by: Maisie Ronnald Acosta PA-C   D/C Meds to be reconciled by nurse after every discharge.    Reviewed by: MD:______ RN_______

## 2024-09-07 NOTE — Progress Notes (Addendum)
 Late note entry 2/3 1155am D/c noted. Contacted out-pt HD clinic [facility] and informed of pt d/c and anticipated arrival back to clinic tmrrw. No further support is needed.     Lavanda Rabecca Birge Dialysis Navigator [phone]

## 2024-09-07 NOTE — Plan of Care (Signed)

## 2024-09-08 LAB — HEPATITIS B SURFACE ANTIBODY, QUANTITATIVE: Hep B S AB Quant (Post): 4670 m[IU]/mL

## 2024-09-08 NOTE — Progress Notes (Signed)
 Complex Care Management Note Care Guide Note  09/08/2024 Name: Louvenia Golomb MRN: [id number] DOB: Dec 25, 1970   Complex Care Management Outreach Attempts: A third unsuccessful outreach was attempted today to offer the patient with information about available complex care management services.  Follow Up Plan:  No further outreach attempts will be made at this time. We have been unable to contact the patient to offer or enroll patient in complex care management services.  Encounter Outcome:  No Answer-Voicemail box Full  Leotis Cl[facility]  [facility], [facility] Guide  Direct Dial : [phone]  Fax [phone]

## 2024-09-08 NOTE — Progress Notes (Signed)
 Complex Care Management Note Care Guide Note  09/08/2024 Name: Alvine Mostafa MRN: [id number] DOB: 1971-05-08   Complex Care Management Outreach Attempts: A second unsuccessful outreach was attempted today to offer the patient with information about available complex care management services.  Follow Up Plan:  Additional outreach attempts will be made to offer the patient complex care management information and services.   Encounter Outcome:  No Answer-Voicemail box Full  Leotis Rase [facility], [facility] Guide  Direct Dial : [phone]  Fax [phone]

## 2024-10-07 ENCOUNTER — Encounter

## 2024-12-07 ENCOUNTER — Ambulatory Visit (HOSPITAL_COMMUNITY)

## 2025-01-06 ENCOUNTER — Encounter

## 2025-04-07 ENCOUNTER — Encounter

## 2025-07-07 ENCOUNTER — Encounter
